# Patient Record
Sex: Female | Born: 1945 | Race: White | Hispanic: No | Marital: Married | Smoking: Never smoker
Health system: Southern US, Community
[De-identification: ages and names within clinical notes are randomized; demographics above are authoritative.]

## PROBLEM LIST (undated history)

## (undated) DIAGNOSIS — M8580 Other specified disorders of bone density and structure, unspecified site: Secondary | ICD-10-CM

## (undated) DIAGNOSIS — E119 Type 2 diabetes mellitus without complications: Secondary | ICD-10-CM

## (undated) DIAGNOSIS — I10 Essential (primary) hypertension: Secondary | ICD-10-CM

## (undated) DIAGNOSIS — I4891 Unspecified atrial fibrillation: Secondary | ICD-10-CM

## (undated) DIAGNOSIS — E1022 Type 1 diabetes mellitus with diabetic chronic kidney disease: Secondary | ICD-10-CM

## (undated) DIAGNOSIS — M5136 Other intervertebral disc degeneration, lumbar region: Secondary | ICD-10-CM

## (undated) DIAGNOSIS — K922 Gastrointestinal hemorrhage, unspecified: Secondary | ICD-10-CM

## (undated) DIAGNOSIS — N183 Chronic kidney disease, stage 3 unspecified: Secondary | ICD-10-CM

## (undated) DIAGNOSIS — M170 Bilateral primary osteoarthritis of knee: Secondary | ICD-10-CM

## (undated) DIAGNOSIS — M51369 Other intervertebral disc degeneration, lumbar region without mention of lumbar back pain or lower extremity pain: Secondary | ICD-10-CM

## (undated) DIAGNOSIS — I5032 Chronic diastolic (congestive) heart failure: Secondary | ICD-10-CM

## (undated) DIAGNOSIS — I4821 Permanent atrial fibrillation: Secondary | ICD-10-CM

## (undated) DIAGNOSIS — J90 Pleural effusion, not elsewhere classified: Secondary | ICD-10-CM

## (undated) DIAGNOSIS — I890 Lymphedema, not elsewhere classified: Secondary | ICD-10-CM

## (undated) DIAGNOSIS — I509 Heart failure, unspecified: Secondary | ICD-10-CM

## (undated) DIAGNOSIS — E785 Hyperlipidemia, unspecified: Secondary | ICD-10-CM

## (undated) DIAGNOSIS — I3139 Other pericardial effusion (noninflammatory): Secondary | ICD-10-CM

## (undated) HISTORY — PX: TONSILLECTOMY: SUR1361

## (undated) HISTORY — DX: Bilateral primary osteoarthritis of knee: M17.0

## (undated) HISTORY — DX: Pleural effusion, not elsewhere classified: J90

## (undated) HISTORY — DX: Heart failure, unspecified: I50.9

## (undated) HISTORY — DX: Type 2 diabetes mellitus without complications: E11.9

## (undated) HISTORY — DX: Gastrointestinal hemorrhage, unspecified: K92.2

## (undated) HISTORY — PX: CHOLECYSTECTOMY: SHX55

## (undated) HISTORY — DX: Permanent atrial fibrillation: I48.21

## (undated) HISTORY — DX: Other intervertebral disc degeneration, lumbar region without mention of lumbar back pain or lower extremity pain: M51.369

## (undated) HISTORY — PX: DILATION AND CURETTAGE OF UTERUS: SHX78

## (undated) HISTORY — PX: TEAR DUCT PROBING WITH STRABISMUS REPAIR: SHX5677

## (undated) HISTORY — DX: Hyperlipidemia, unspecified: E78.5

## (undated) HISTORY — DX: Other intervertebral disc degeneration, lumbar region: M51.36

## (undated) HISTORY — DX: Other pericardial effusion (noninflammatory): I31.39

## (undated) HISTORY — DX: Other specified disorders of bone density and structure, unspecified site: M85.80

## (undated) HISTORY — DX: Essential (primary) hypertension: I10

## (undated) HISTORY — DX: Chronic kidney disease, stage 3 (moderate): N18.3

## (undated) HISTORY — DX: Type 1 diabetes mellitus with diabetic chronic kidney disease: E10.22

## (undated) HISTORY — DX: Chronic kidney disease, stage 3 unspecified: N18.30

## (undated) SURGERY — Surgical Case
Anesthesia: *Unknown

## (undated) SURGERY — RIGHT HEART CATH
Anesthesia: Choice

---

## 2009-05-27 ENCOUNTER — Emergency Department: Payer: Self-pay | Admitting: Emergency Medicine

## 2012-02-13 ENCOUNTER — Ambulatory Visit: Payer: Self-pay

## 2013-02-24 ENCOUNTER — Ambulatory Visit: Payer: Self-pay

## 2013-02-24 LAB — HM DEXA SCAN: HM DEXA SCAN: NORMAL

## 2013-02-24 LAB — HM MAMMOGRAPHY

## 2014-08-02 ENCOUNTER — Ambulatory Visit: Payer: Self-pay | Admitting: Orthopedic Surgery

## 2015-01-27 DIAGNOSIS — M5136 Other intervertebral disc degeneration, lumbar region: Secondary | ICD-10-CM | POA: Insufficient documentation

## 2015-01-27 DIAGNOSIS — M170 Bilateral primary osteoarthritis of knee: Secondary | ICD-10-CM | POA: Insufficient documentation

## 2015-01-27 DIAGNOSIS — I129 Hypertensive chronic kidney disease with stage 1 through stage 4 chronic kidney disease, or unspecified chronic kidney disease: Secondary | ICD-10-CM | POA: Insufficient documentation

## 2015-01-27 DIAGNOSIS — N183 Chronic kidney disease, stage 3 unspecified: Secondary | ICD-10-CM | POA: Insufficient documentation

## 2015-01-27 DIAGNOSIS — E1022 Type 1 diabetes mellitus with diabetic chronic kidney disease: Secondary | ICD-10-CM | POA: Insufficient documentation

## 2015-01-27 DIAGNOSIS — M8580 Other specified disorders of bone density and structure, unspecified site: Secondary | ICD-10-CM | POA: Insufficient documentation

## 2015-01-27 DIAGNOSIS — E1129 Type 2 diabetes mellitus with other diabetic kidney complication: Secondary | ICD-10-CM | POA: Insufficient documentation

## 2015-01-27 DIAGNOSIS — M5137 Other intervertebral disc degeneration, lumbosacral region: Secondary | ICD-10-CM | POA: Insufficient documentation

## 2015-01-27 DIAGNOSIS — E785 Hyperlipidemia, unspecified: Secondary | ICD-10-CM | POA: Insufficient documentation

## 2015-01-27 DIAGNOSIS — H4010X1 Unspecified open-angle glaucoma, mild stage: Secondary | ICD-10-CM | POA: Insufficient documentation

## 2015-03-24 ENCOUNTER — Other Ambulatory Visit: Payer: Self-pay | Admitting: Family Medicine

## 2015-03-24 DIAGNOSIS — E119 Type 2 diabetes mellitus without complications: Secondary | ICD-10-CM

## 2015-03-24 DIAGNOSIS — Z780 Asymptomatic menopausal state: Secondary | ICD-10-CM

## 2015-03-24 DIAGNOSIS — E785 Hyperlipidemia, unspecified: Secondary | ICD-10-CM

## 2015-03-24 DIAGNOSIS — M8580 Other specified disorders of bone density and structure, unspecified site: Secondary | ICD-10-CM

## 2015-03-24 DIAGNOSIS — Z1239 Encounter for other screening for malignant neoplasm of breast: Secondary | ICD-10-CM

## 2015-03-24 DIAGNOSIS — I10 Essential (primary) hypertension: Secondary | ICD-10-CM

## 2015-03-24 LAB — HM DIABETES EYE EXAM

## 2015-03-27 ENCOUNTER — Other Ambulatory Visit: Payer: Self-pay

## 2015-03-27 ENCOUNTER — Encounter: Payer: Self-pay | Admitting: Family Medicine

## 2015-03-27 ENCOUNTER — Ambulatory Visit (INDEPENDENT_AMBULATORY_CARE_PROVIDER_SITE_OTHER): Payer: Medicare Other | Admitting: Family Medicine

## 2015-03-27 ENCOUNTER — Encounter (INDEPENDENT_AMBULATORY_CARE_PROVIDER_SITE_OTHER): Payer: Self-pay

## 2015-03-27 VITALS — BP 108/76 | HR 105 | Temp 97.7°F | Ht 61.0 in | Wt 236.0 lb

## 2015-03-27 DIAGNOSIS — E785 Hyperlipidemia, unspecified: Secondary | ICD-10-CM | POA: Diagnosis not present

## 2015-03-27 DIAGNOSIS — E119 Type 2 diabetes mellitus without complications: Secondary | ICD-10-CM

## 2015-03-27 DIAGNOSIS — I10 Essential (primary) hypertension: Secondary | ICD-10-CM | POA: Diagnosis not present

## 2015-03-27 DIAGNOSIS — I890 Lymphedema, not elsewhere classified: Secondary | ICD-10-CM | POA: Insufficient documentation

## 2015-03-27 DIAGNOSIS — E1022 Type 1 diabetes mellitus with diabetic chronic kidney disease: Secondary | ICD-10-CM

## 2015-03-27 DIAGNOSIS — N183 Chronic kidney disease, stage 3 unspecified: Secondary | ICD-10-CM

## 2015-03-27 DIAGNOSIS — M5136 Other intervertebral disc degeneration, lumbar region: Secondary | ICD-10-CM | POA: Diagnosis not present

## 2015-03-27 DIAGNOSIS — R609 Edema, unspecified: Secondary | ICD-10-CM

## 2015-03-27 LAB — LIPID PANEL PICCOLO, WAIVED
CHOL/HDL RATIO PICCOLO,WAIVE: 4 mg/dL
CHOLESTEROL PICCOLO, WAIVED: 234 mg/dL — AB (ref <200)
HDL Chol Piccolo, Waived: 59 mg/dL (ref >59)
LDL CHOL CALC PICCOLO WAIVED: 143 mg/dL — AB (ref <100)
TRIGLYCERIDES PICCOLO,WAIVED: 159 mg/dL — AB (ref <150)
VLDL Chol Calc Piccolo,Waive: 32 mg/dL — ABNORMAL HIGH (ref <30)

## 2015-03-27 LAB — HEPATIC FUNCTION PANEL
ALT: 18 U/L (ref 7–35)
AST: 18 U/L (ref 13–35)

## 2015-03-27 LAB — LIPID PANEL
Cholesterol: 234 mg/dL — AB (ref 0–200)
HDL: 59 mg/dL (ref 35–70)
LDL Cholesterol: 143 mg/dL
Triglycerides: 159 mg/dL (ref 40–160)

## 2015-03-27 LAB — HEMOGLOBIN A1C: Hgb A1c MFr Bld: 13.1 % — AB (ref 4.0–6.0)

## 2015-03-27 LAB — BAYER DCA HB A1C WAIVED: HB A1C: 13.1 % — AB (ref <7.0)

## 2015-03-27 MED ORDER — BENAZEPRIL-HYDROCHLOROTHIAZIDE 20-25 MG PO TABS: 1.0000 | ORAL_TABLET | Freq: Every day | ORAL | Status: DC

## 2015-03-27 MED ORDER — METFORMIN HCL ER (OSM) 1000 MG PO TB24: 1000.0000 mg | ORAL_TABLET | Freq: Two times a day (BID) | ORAL | Status: DC

## 2015-03-27 NOTE — Patient Instructions (Signed)
Chronic Kidney Disease Chronic kidney disease occurs when the kidneys are damaged over a long period. The kidneys are two organs that lie on either side of the spine between the middle of the back and the front of the abdomen. The kidneys:   Remove wastes and extra water from the blood.   Produce important hormones. These help keep bones strong, regulate blood pressure, and help create red blood cells.   Balance the fluids and chemicals in the blood and tissues. A small amount of kidney damage may not cause problems, but a large amount of damage may make it difficult or impossible for the kidneys to work the way they should. If steps are not taken to slow down the kidney damage or stop it from getting worse, the kidneys may stop working permanently. Most of the time, chronic kidney disease does not go away. However, it can often be controlled, and those with the disease can usually live normal lives. CAUSES  The most common causes of chronic kidney disease are diabetes and high blood pressure (hypertension). Chronic kidney disease may also be caused by:   Diseases that cause the kidneys' filters to become inflamed.   Diseases that affect the immune system.   Genetic diseases.   Medicines that damage the kidneys, such as anti-inflammatory medicines.  Poisoning or exposure to toxic substances.   A reoccurring kidney or urinary infection.   A problem with urine flow. This may be caused by:   Cancer.   Kidney stones.   An enlarged prostate in males. SIGNS AND SYMPTOMS  Because the kidney damage in chronic kidney disease occurs slowly, symptoms develop slowly and may not be obvious until the kidney damage becomes severe. A person may have a kidney disease for years without showing any symptoms. Symptoms can include:   Swelling (edema) of the legs, ankles, or feet.   Tiredness (lethargy).   Nausea or vomiting.   Confusion.   Problems with urination, such as:    Decreased urine production.   Frequent urination, especially at night.   Frequent accidents in children who are potty trained.   Muscle twitches and cramps.   Shortness of breath.  Weakness.   Persistent itchiness.   Loss of appetite.  Metallic taste in the mouth.  Trouble sleeping.  Slowed development in children.  Short stature in children. DIAGNOSIS  Chronic kidney disease may be detected and diagnosed by tests, including blood, urine, imaging, or kidney biopsy tests.  TREATMENT  Most chronic kidney diseases cannot be cured. Treatment usually involves relieving symptoms and preventing or slowing the progression of the disease. Treatment may include:   A special diet. You may need to avoid alcohol and foods thatare salty and high in potassium.   Medicines. These may:   Lower blood pressure.   Relieve anemia.   Relieve swelling.   Protect the bones. HOME CARE INSTRUCTIONS   Follow your prescribed diet.   Take medicines only as directed by your health care provider. Do not take any new medicines (prescription, over-the-counter, or nutritional supplements) unless approved by your health care provider. Many medicines can worsen your kidney damage or need to have the dose adjusted.   Quit smoking if you smoke. Talk to your health care provider about a smoking cessation program.   Keep all follow-up visits as directed by your health care provider. SEEK IMMEDIATE MEDICAL CARE IF:  Your symptoms get worse or you develop new symptoms.   You develop symptoms of end-stage kidney disease. These  include:   Headaches.   Abnormally dark or light skin.   Numbness in the hands or feet.   Easy bruising.   Frequent hiccups.   Menstruation stops.   You have a fever.   You have decreased urine production.   You havepain or bleeding when urinating. MAKE SURE YOU:  Understand these instructions.  Will watch your condition.  Will  get help right away if you are not doing well or get worse. FOR MORE INFORMATION   American Association of Kidney Patients: BombTimer.gl  National Kidney Foundation: www.kidney.[place]: https://mathis.com/  [facility]: www.lifeoptions.org and www.kidneyschool.org Document Released: 07/16/2008 Document Revised: 02/21/2014 Document Reviewed: 06/05/2012 [facility] Patient Information 2015 [place], [place]. This information is not intended to replace advice given to you by your health care provider. Make sure you discuss any questions you have with your health care provider. Type 2 Diabetes Mellitus Type 2 diabetes mellitus is a long-term (chronic) disease. In type 2 diabetes:  The pancreas does not make enough of a hormone called insulin.  The cells in the body do not respond as well to the insulin that is made.  Both of the above can happen. Normally, insulin moves sugars from food into tissue cells. This gives you energy. If you have type 2 diabetes, sugars cannot be moved into tissue cells. This causes high blood sugar (hyperglycemia).  HOME CARE  Have your hemoglobin A1c level checked twice a year. The level shows if your diabetes is under control or out of control.  Test your blood sugar level every day as told by your doctor.  Check your ketone levels by testing your pee (urine) when you are sick and as told.  Take your diabetes or insulin medicine as told by your doctor.  Never run out of insulin.  Adjust how much insulin you give yourself based on how many carbs (carbohydrates) you eat. Carbs are in many foods, such as fruits, vegetables, whole grains, and dairy products.  Have a healthy snack between every healthy meal. Have 3 meals and 3 snacks a day.  Lose weight if you are overweight.  Carry a medical alert card or wear your medical alert jewelry.  Carry a 15-gram carb snack with you at all times. Examples include:  Glucose pills,  3 or 4.  Glucose gel, 15-gram tube.  Raisins, 2 tablespoons (24 grams).  Jelly beans, 6.  Animal crackers, 8.  Regular (not diet) pop, 4 ounces (120 milliliters).  Gummy treats, 9.  Notice low blood sugar (hypoglycemia) symptoms, such as:  Shaking (tremors).  Trouble thinking clearly.  Sweating.  Faster heart rate.  Headache.  Dry mouth.  Hunger.  Crabbiness (irritability).  Being worried or tense (anxious).  Restless sleep.  A change in speech or coordination.  Confusion.  Treat low blood sugar right away. If you are alert and can swallow, follow the 15:15 rule:  Take 15-20 grams of a rapid-acting glucose or carb. This includes glucose gel, glucose pills, or 4 ounces (120 milliliters) of fruit juice, regular pop, or low-fat milk.  Check your blood sugar level 15 minutes after taking the glucose.  Take 15-20 grams more of glucose if the repeat blood sugar level is still 70 mg/dL (milligrams/deciliter) or below.  Eat a meal or snack within 1 hour of the blood sugar levels going back to normal.  Notice early symptoms of high blood sugar, such as:  Being really thirsty or drinking a lot (polydipsia).  Peeing a lot (  polyuria).  Do at least 150 minutes of physical activity a week or as told.  Split the 150 minutes of activity up during the week. Do not do 150 minutes of activity in one day.  Perform exercises, such as weight lifting, at least 2 times a week or as told.  Spend no more than 90 minutes at one time inactive.  Adjust your insulin or food intake as needed if you start a new exercise or sport.  Follow your sick-day plan when you are not able to eat or drink as usual.  Do not smoke, chew tobacco, or use electronic cigarettes.  Women who are not pregnant should drink no more than 1 drink a day. Men should drink no more than 2 drinks a day.  Only drink alcohol with food.  Ask your doctor if alcohol is safe for you.  Tell your doctor if you  drink alcohol several times during the week.  See your doctor regularly.  Schedule an eye exam soon after you are told you have diabetes. Schedule exams once every year.  Check your skin and feet every day. Check for cuts, bruises, redness, nail problems, bleeding, blisters, or sores. A doctor should do a foot exam once a year.  Brush your teeth and gums twice a day. Floss once a day. Visit your dentist regularly.  Share your diabetes plan with your workplace or school.  Stay up-to-date with shots that fight against diseases (immunizations).  Learn how to deal with stress.  Get diabetes education and support as needed.  Ask your doctor for special help if:  You need help to maintain or improve how you do things on your own.  You need help to maintain or improve the quality of your life.  You have foot or hand problems.  You have trouble cleaning yourself, dressing, eating, or doing physical activity. GET HELP IF:  You are unable to eat or drink for more than 6 hours.  You feel sick to your stomach (nauseous) or throw up (vomit) for more than 6 hours.  Your blood sugar level is over 240 mg/dL.  There is a change in mental status.  You get another serious illness.  You have watery poop (diarrhea) for more than 6 hours.  You have been sick or have had a fever for 2 or more days and are not getting better.  You have pain when you are active. GET HELP RIGHT AWAY IF:  You have trouble breathing.  Your ketone levels are higher than your doctor says they should be. MAKE SURE YOU:  Understand these instructions.  Will watch your condition.  Will get help right away if you are not doing well or get worse. Document Released: 07/16/2008 Document Revised: 02/21/2014 Document Reviewed: 05/08/2012 [facility] Patient Information 2015 [place], [place]. This information is not intended to replace advice given to you by your health care provider. Make sure you discuss any questions  you have with your health care provider.

## 2015-03-27 NOTE — Assessment & Plan Note (Signed)
Pain in her back has gotten worse and is preventing her from doing activities that she would like to do. Would like a 2nd opinion from a neurosurgeon of her choice. Referral generated today. Does not want referral to pain management at this time- would like to hear what the neurosurgeon has to say. Would greatly benefit from weight loss and getting back in the pool.

## 2015-03-27 NOTE — Assessment & Plan Note (Signed)
Likely multifactorial. No suggestion of DVT. Suggested wearing her compression stockings starting in the AM, all day except at night. Elevate when able to. Await results of CMP. Recheck in 1 month at follow up. Continue to monitor.

## 2015-03-27 NOTE — Assessment & Plan Note (Signed)
Discussed CKD with patient today. She was not aware she had it. Discussed that we need to get blood sugar under control and keep BP under control. On ACE-inhibitor for renal protection. CMP checked again today. If GFR worsening again, may need to consider referral to nephrology. Continue to monitor. Continue current regimen.

## 2015-03-27 NOTE — Assessment & Plan Note (Signed)
Patient wanted to stay off medication again. A1c significantly elevated at 13.1. Discussed with patient that this is a level where we would normally be talking about starting insulin. She doesn't want to do that. Discussed that we cannot let her just try to bring this down on her own. Will start metformin 1000mg  BID. Will likely need additional medication to bring it down. Stressed the importance of diet and exercise. Will have her really work hard and continue to monitor. Follow up in 1 month to check on tolerance and to see how her sugars have been doing. Continue to monitor closely.

## 2015-03-27 NOTE — Progress Notes (Signed)
BP 108/76 mmHg  Pulse 105  Temp(Src) 97.7 F (36.5 C)  Ht 5' 1" (1.549 m)  Wt 236 lb (107.049 kg)  BMI 44.61 kg/m2  SpO2 94%   Subjective:    Patient ID: Karen Farley, female    DOB: 1945/11/26, 69 y.o.   MRN: [id number]  HPI: Karen Farley is a 69 y.o. female presenting on 03/27/2015 for Diabetes; Hypertension; and Hyperlipidemia Has been caring for her mother-in-law and she recently passed away. With that, her routine has changed significantly and she hasn't been doing what she is supposed to be doing. She has started working on it again and has been doing better with it, but knows that it might be very high today.   HYPERTENSION / HYPERLIPIDEMIA Satisfied with current treatment? yes Duration of hypertension: chronic BP monitoring frequency: not checking BP range:  BP medication side effects: no Past BP meds: benazepril/HCTZ, tekturna, valsartan, valsartan-HCTZ and verapamil Duration of hyperlipidemia: chronic Cholesterol medication side effects: no- not on anything Cholesterol supplements: none Past cholesterol medications: none Medication compliance: excellent compliance Aspirin: no Recent stressors: yes Recurrent headaches: no Visual changes: no Palpitations: no Dyspnea: no Chest pain: no Lower extremity edema: yes Dizzy/lightheaded: no  DIABETES- states that she knows that her A1c is going to be high today. She states that she has had a lot going on and her sugars have not been doing as well as they should have been.  Hypoglycemic episodes:no Polydipsia/polyuria: no Visual disturbance: no Chest pain: no Paresthesias: no Glucose Monitoring: yes  Accucheck frequency: Daily  Fasting glucose: in the 200s Taking Insulin?: no Blood Pressure Monitoring: not checking Retinal Examination: Up to Date- went Last Friday Foot Exam: Up to Date- done today Diabetic Education: Not Completed Pneumovax: Up to Date  Refuses prevnar Influenza: Not up to Date Refuses  flu vaccine Aspirin: no   BACK PAIN- has DDD, had a steroid injection that didn't help at all. Would like a referral to see a different neurosurgeon for a 2nd opinion regarding back surgery.  Duration: chronic Mechanism of injury: unknown Location: low back Onset: gradual Severity: severe Quality: dull, aching, shooting and throbbing Frequency: constant Radiation: none Aggravating factors: lifting, movement, walking, bending and prolonged sitting Alleviating factors: rest, heat and laying Status: worse Treatments attempted: rest  Relief with NSAIDs?: mild Nighttime pain:  no Paresthesias / decreased sensation:  yes Bowel / bladder incontinence:  no Fevers:  no Dysuria / urinary frequency:  no  Edema: has noticed significant edema over the past few days. Doesn't usually have it unless she's flying. Has not been wearing compression stockings. Has been elevating, but not that much. Better in the AM, worse in the PM.  Relevant past medical, surgical, family and social history reviewed and updated as indicated. Interim medical history since our last visit reviewed. Allergies and medications reviewed and updated.  Current Outpatient Prescriptions on File Prior to Visit  Medication Sig  . Azelaic Acid 15 % cream Apply topically 2 (two) times daily. After skin is thoroughly washed and patted dry, gently but thoroughly massage a thin film of azelaic acid cream into the affected area twice daily, in the morning and evening.  . Clobetasol Prop Emollient Base 0.05 % emollient cream Apply topically 2 (two) times daily.  Regino Schultze Bandages & Supports (MEDICAL COMPRESSION STOCKINGS) MISC by Does not apply route.  Marland Kitchen glucose blood test strip 1 each by Other route as needed for other. Use as instructed  . latanoprost (XALATAN) 0.005 %  ophthalmic solution 1 drop at bedtime.  . Multiple Vitamin (MULTIVITAMIN WITH MINERALS) TABS tablet Take 1 tablet by mouth daily.   No current facility-administered  medications on file prior to visit.    Review of Systems  Constitutional: Negative.   Respiratory: Negative.   Cardiovascular: Positive for leg swelling. Negative for chest pain and palpitations.  Gastrointestinal: Negative.   Musculoskeletal: Positive for back pain.       Feels like it is getting worse. Can't do anything that she wants to. Difficult getting around.  Neurological: Negative.   Psychiatric/Behavioral: Negative.     Per HPI unless specifically indicated above     Objective:    BP 108/76 mmHg  Pulse 105  Temp(Src) 97.7 F (36.5 C)  Ht 5' 1" (1.549 m)  Wt 236 lb (107.049 kg)  BMI 44.61 kg/m2  SpO2 94%  Wt Readings from Last 3 Encounters:  03/27/15 236 lb (107.049 kg)  09/24/14 244 lb (110.678 kg)    Physical Exam  Constitutional: She is oriented to person, place, and time. She appears well-developed and well-nourished.  Morbidly obese.   HENT:  Head: Normocephalic and atraumatic.  Eyes: Conjunctivae and EOM are normal. Pupils are equal, round, and reactive to light.  Cardiovascular: Normal rate and regular rhythm.   Pulmonary/Chest: Effort normal and breath sounds normal.  Musculoskeletal: She exhibits edema.  2+ edema bilaterally with hemosiderin changes bilaterally  Neurological: She is alert and oriented to person, place, and time. She has normal reflexes.  Skin: Skin is warm and dry.  Psychiatric: She has a normal mood and affect. Her behavior is normal. Judgment and thought content normal.  Nursing note and vitals reviewed. DM Foot Exam Color: normal Sensation Monofilament:normal right and left dorsal, plantar, toe and distal Circulation: Pulses normal right and left pedal and posterial tibial  Lesions: none    Results for orders placed or performed in visit on 03/27/15  Hemoglobin A1c  Result Value Ref Range   Hgb A1c MFr Bld 13.1 (A) 4.0 - 6.0 %  Lipid panel  Result Value Ref Range   Cholesterol 234 (A) 0 - 200 mg/dL  Lipid panel  Result  Value Ref Range   Triglycerides 159 40 - 160 mg/dL   HDL 59 35 - 70 mg/dL   LDL Cholesterol 143 mg/dL  Hepatic function panel  Result Value Ref Range   ALT 18 7 - 35 U/L   AST 18 13 - 35 U/L      Assessment & Plan:   Problem List Items Addressed This Visit    Diabetes mellitus without complication - Primary    Patient wanted to stay off medication again. A1c significantly elevated at 13.1. Discussed with patient that this is a level where we would normally be talking about starting insulin. She doesn't want to do that. Discussed that we cannot let her just try to bring this down on her own. Will start metformin 1035m BID. Will likely need additional medication to bring it down. Stressed the importance of diet and exercise. Will have her really work hard and continue to monitor. Follow up in 1 month to check on tolerance and to see how her sugars have been doing. Continue to monitor closely.       Relevant Medications   metformin (FORTAMET) 1000 MG (OSM) 24 hr tablet   benazepril-hydrochlorthiazide (LOTENSIN HCT) 20-25 MG per tablet   Other Relevant Orders   HgB A1c   Hypertension    Well controlled on current regimen.  Will recheck CMP today. Continue benazepril-HCTZ. Stressed the importance of diet and exercise. Discussed chronic kidney disease. Continue to monitor.       Relevant Medications   benazepril-hydrochlorthiazide (LOTENSIN HCT) 20-25 MG per tablet   Hyperlipidemia    Not under good control at this time. Patient would really like to try diet and exercise prior to starting another medication. Will give her 3-6 months. If not doing better on recheck, will need high-dose statin. Continue to monitor closely.       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 She was not aware she had it. Discussed that we need to get blood sugar under control and keep  BP under control. On ACE-inhibitor for renal protection. CMP checked again today. If GFR worsening again, may need to consider referral to nephrology. Continue to monitor. Continue current regimen.       Relevant Medications   metformin (FORTAMET) 1000 MG (OSM) 24 hr tablet   benazepril-hydrochlorthiazide (LOTENSIN HCT) 20-25 MG per tablet   DDD (degenerative disc disease), lumbar    Pain in her back has gotten worse and is preventing her from doing activities that she would like to do. Would like a 2nd opinion from a neurosurgeon of her choice. Referral generated today. Does not want referral to pain management at this time- would like to hear what the neurosurgeon has to say. Would greatly benefit from weight loss and getting back in the pool.       Relevant Orders   Ambulatory referral to Neurosurgery   Edema    Likely multifactorial. No suggestion of DVT. Suggested wearing her compression stockings starting in the AM, all day except at night. Elevate when able to. Await results of CMP. Recheck in 1 month at follow up. Continue to monitor.        Other Visit Diagnoses    Hyperlipemia        Relevant Medications    benazepril-hydrochlorthiazide (LOTENSIN HCT) 20-25 MG per tablet    Other Relevant Orders    Cholesterol, Total    Essential hypertension, benign        Relevant Medications    benazepril-hydrochlorthiazide (LOTENSIN HCT) 20-25 MG per tablet    Other Relevant Orders    Comp Met (CMET)        Follow up plan: Return in about 4 weeks (around 04/24/2015) for follow up on DM.         

## 2015-03-27 NOTE — Assessment & Plan Note (Signed)
Not under good control at this time. Patient would really like to try diet and exercise prior to starting another medication. Will give her 3-6 months. If not doing better on recheck, will need high-dose statin. Continue to monitor closely.

## 2015-03-27 NOTE — Assessment & Plan Note (Signed)
Well controlled on current regimen. Will recheck CMP today. Continue benazepril-HCTZ. Stressed the importance of diet and exercise. Discussed chronic kidney disease. Continue to monitor.

## 2015-03-28 ENCOUNTER — Other Ambulatory Visit: Payer: Self-pay | Admitting: Family Medicine

## 2015-03-28 DIAGNOSIS — N183 Chronic kidney disease, stage 3 (moderate): Secondary | ICD-10-CM

## 2015-03-28 LAB — COMPREHENSIVE METABOLIC PANEL: CALCIUM: 9.8 mg/dL (ref 8.7–10.3)

## 2015-03-29 ENCOUNTER — Telehealth: Payer: Self-pay | Admitting: Family Medicine

## 2015-03-29 NOTE — Telephone Encounter (Signed)
Called and let patient know the results of her lab work. Cholesterol up- will work on diet and exercise and recheck in 3 months. Kidney function down. Recommended referral to nephrology patient would like to hold off right this second and see how she does next visit. Advised her that she doesn't need to make an appointment yet and we can see how she does in a month. Patient aware of results.

## 2015-04-06 ENCOUNTER — Telehealth: Payer: Self-pay | Admitting: Family Medicine

## 2015-04-06 MED ORDER — METFORMIN HCL ER (OSM) 1000 MG PO TB24: 1000.0000 mg | ORAL_TABLET | Freq: Two times a day (BID) | ORAL | Status: DC

## 2015-04-06 MED ORDER — BENAZEPRIL-HYDROCHLOROTHIAZIDE 20-25 MG PO TABS: 1.0000 | ORAL_TABLET | Freq: Every day | ORAL | Status: DC

## 2015-04-06 NOTE — Telephone Encounter (Signed)
E-Fax came through for refill: [id number] (LOTENSIN HCT) 20-25 MG per tablet Copy of Rx in basket beside me.

## 2015-04-06 NOTE — Telephone Encounter (Signed)
Pharm would like to get rx for Benazepril/HCTZ and Metformin changed to 90 day supplies.

## 2015-04-06 NOTE — Telephone Encounter (Signed)
Rx sent to her pharmacy 

## 2015-04-27 ENCOUNTER — Other Ambulatory Visit: Payer: Self-pay | Admitting: Family Medicine

## 2015-04-27 DIAGNOSIS — E1122 Type 2 diabetes mellitus with diabetic chronic kidney disease: Secondary | ICD-10-CM

## 2015-04-27 DIAGNOSIS — N182 Chronic kidney disease, stage 2 (mild): Secondary | ICD-10-CM

## 2015-04-28 ENCOUNTER — Encounter: Payer: Self-pay | Admitting: Family Medicine

## 2015-04-28 ENCOUNTER — Ambulatory Visit (INDEPENDENT_AMBULATORY_CARE_PROVIDER_SITE_OTHER): Payer: Medicare Other | Admitting: Family Medicine

## 2015-04-28 DIAGNOSIS — E1122 Type 2 diabetes mellitus with diabetic chronic kidney disease: Secondary | ICD-10-CM

## 2015-04-28 DIAGNOSIS — N183 Chronic kidney disease, stage 3 unspecified: Secondary | ICD-10-CM | POA: Insufficient documentation

## 2015-04-28 DIAGNOSIS — N182 Chronic kidney disease, stage 2 (mild): Secondary | ICD-10-CM

## 2015-04-28 MED ORDER — BENAZEPRIL-HYDROCHLOROTHIAZIDE 20-25 MG PO TABS: 1.0000 | ORAL_TABLET | Freq: Every day | ORAL | Status: DC

## 2015-04-28 NOTE — Assessment & Plan Note (Signed)
Tolerating her metformin. Sugars doing better. A1c due in 2 months. Due to see kidney doctor- needs to make appointment. Not exercising. Recommended punching during commercials and going back to the [facility]. Recommended compression stockings for swelling and elevation. Not interested in referral to GI at this time. Will continue to monitor and let us know if swallowing gets worse.

## 2015-04-28 NOTE — Patient Instructions (Signed)
Edema  Edema is an abnormal buildup of fluids. It is more common in your legs and thighs. Painless swelling of the feet and ankles is more likely as a person ages. It also is common in looser skin, like around your eyes.  HOME CARE   · Keep the affected body part above the level of the heart while lying down.  · Do not sit still or stand for a long time.  · Do not put anything right under your knees when you lie down.  · Do not wear tight clothes on your upper legs.  · Exercise your legs to help the puffiness (swelling) go down.  · Wear elastic bandages or support stockings as told by your doctor.  · A low-salt diet may help lessen the puffiness.  · Only take medicine as told by your doctor.  GET HELP IF:  · Treatment is not working.  · You have heart, liver, or kidney disease and notice that your skin looks puffy or shiny.  · You have puffiness in your legs that does not get better when you raise your legs.  · You have sudden weight gain for no reason.  GET HELP RIGHT AWAY IF:   · You have shortness of breath or chest pain.  · You cannot breathe when you lie down.  · You have pain, redness, or warmth in the areas that are puffy.  · You have heart, liver, or kidney disease and get edema all of a sudden.  · You have a fever and your symptoms get worse all of a sudden.  MAKE SURE YOU:   · Understand these instructions.  · Will watch your condition.  · Will get help right away if you are not doing well or get worse.  Document Released: 03/25/2008 Document Revised: 10/12/2013 Document Reviewed: 07/30/2013  [employer]® Patient Information ©2015 [employer]. This information is not intended to replace advice given to you by your health care provider. Make sure you discuss any questions you have with your health care provider.

## 2015-04-28 NOTE — Progress Notes (Signed)
BP 138/90 mmHg  Pulse 114  Temp(Src) 98.4 F (36.9 C)  Ht 5' 1.5" (1.562 m)  Wt 236 lb (107.049 kg)  BMI 43.88 kg/m2  SpO2 94%   Subjective:    Patient ID: Karen Farley, female    DOB: 1946/03/14, 69 y.o.   MRN: [id number]  HPI: Karen Farley is a 69 y.o. female  Chief Complaint  Patient presents with  . Diabetes   DIABETES- did OK on the medicine, then got an upset stomach, then got better, going back and forth with some diarrhea Hypoglycemic episodes:no Polydipsia/polyuria: no Visual disturbance: no Chest pain: no Paresthesias: no Glucose Monitoring: yes  Accucheck frequency: Daily  Fasting glucose: 152-133 Blood Pressure Monitoring: not checking Retinal Examination: Up to Date Foot Exam: Up to Date Diabetic Education: Completed  Painful swallowing. Has been going on for the past couple of months. Only with swallowing water, very occasionally, usually with big gulps. No other complaints.   Relevant past medical, surgical, family and social history reviewed and updated as indicated. Interim medical history since our last visit reviewed. Allergies and medications reviewed and updated.  Review of Systems  Constitutional: Negative.   Cardiovascular: Negative.   Musculoskeletal: Positive for back pain and gait problem. Negative for myalgias, joint swelling, arthralgias, neck pain and neck stiffness.  Neurological: Negative.   Psychiatric/Behavioral: Negative.     Per HPI unless specifically indicated above     Objective:    BP 138/90 mmHg  Pulse 114  Temp(Src) 98.4 F (36.9 C)  Ht 5' 1.5" (1.562 m)  Wt 236 lb (107.049 kg)  BMI 43.88 kg/m2  SpO2 94%  Wt Readings from Last 3 Encounters:  04/28/15 236 lb (107.049 kg)  03/27/15 236 lb (107.049 kg)  09/24/14 244 lb (110.678 kg)    Physical Exam  Constitutional: She is oriented to person, place, and time. She appears well-developed and well-nourished. No distress.  HENT:  Head: Normocephalic and  atraumatic.  Right Ear: Hearing normal.  Left Ear: Hearing normal.  Nose: Nose normal.  Eyes: Conjunctivae and lids are normal. Right eye exhibits no discharge. Left eye exhibits no discharge. No scleral icterus.  Cardiovascular: Normal rate, regular rhythm and normal heart sounds.  Exam reveals no gallop and no friction rub.   No murmur heard. Pulmonary/Chest: Effort normal. No respiratory distress. She has no wheezes. She has no rales. She exhibits no tenderness.  Musculoskeletal: Normal range of motion. She exhibits edema.  Neurological: She is alert and oriented to person, place, and time.  Skin: Skin is warm, dry and intact. No rash noted. No erythema. No pallor.  Psychiatric: She has a normal mood and affect. Her speech is normal and behavior is normal. Judgment and thought content normal. Cognition and memory are normal.    Results for orders placed or performed in visit on 03/27/15  Comp Met (CMET)  Result Value Ref Range   Calcium 9.8 8.7 - 10.3 mg/dL  Hemoglobin A1c  Result Value Ref Range   Hgb A1c MFr Bld 13.1 (A) 4.0 - 6.0 %  Lipid panel  Result Value Ref Range   Cholesterol 234 (A) 0 - 200 mg/dL  Lipid panel  Result Value Ref Range   Triglycerides 159 40 - 160 mg/dL   HDL 59 35 - 70 mg/dL   LDL Cholesterol 143 mg/dL  Hepatic function panel  Result Value Ref Range   ALT 18 7 - 35 U/L   AST 18 13 - 35 U/L  Lipid  Panel Piccolo, [employer]  Result Value Ref Range   Cholesterol Piccolo, Waived 234 (H) <200 mg/dL   HDL Chol Piccolo, Waived 59 >59 mg/dL   Triglycerides Piccolo,Waived 159 (H) <150 mg/dL   Chol/HDL Ratio Piccolo,Waive 4.0 mg/dL   LDL Chol Calc Piccolo Waived 143 (H) <100 mg/dL   VLDL Chol Calc Piccolo,Waive 32 (H) <30 mg/dL  Bayer DCA Hb A1c Waived  Result Value Ref Range   Bayer DCA Hb A1c Waived 13.1 (H) <7.0 %  HM DIABETES EYE EXAM  Result Value Ref Range   HM Diabetic Eye Exam No Retinopathy No Retinopathy      Assessment & Plan:   Problem List  Items Addressed This Visit      Genitourinary   CKD stage 2 due to type 2 diabetes mellitus    Tolerating her metformin. Sugars doing better. A1c due in 2 months. Due to see kidney doctor- needs to make appointment. Not exercising. Recommended punching during commercials and going back to the [facility]. Recommended compression stockings for swelling and elevation. Not interested in referral to GI at this time. Will continue to monitor and let us know if swallowing gets worse.       Relevant Medications   benazepril-hydrochlorthiazide (LOTENSIN HCT) 20-25 MG per tablet       Follow up plan: Return in about 2 months (around 06/29/2015).

## 2015-04-29 LAB — BASIC METABOLIC PANEL
BUN/Creatinine Ratio: 15 (ref 11–26)
BUN: 15 mg/dL (ref 8–27)
CHLORIDE: 98 mmol/L (ref 97–108)
CO2: 23 mmol/L (ref 18–29)
CREATININE: 0.99 mg/dL (ref 0.57–1.00)
Calcium: 9.8 mg/dL (ref 8.7–10.3)
GFR calc non Af Amer: 58 mL/min/{1.73_m2} — ABNORMAL LOW (ref >59)
GFR, EST AFRICAN AMERICAN: 67 mL/min/{1.73_m2} (ref >59)
Glucose: 148 mg/dL — ABNORMAL HIGH (ref 65–99)
Potassium: 4.4 mmol/L (ref 3.5–5.2)
SODIUM: 142 mmol/L (ref 134–144)

## 2015-06-23 ENCOUNTER — Ambulatory Visit
Admission: RE | Admit: 2015-06-23 | Discharge: 2015-06-23 | Disposition: A | Discharge status: Home / Self Care | Payer: Medicare Other | Source: Ambulatory Visit | Attending: Neurosurgery | Admitting: Neurosurgery

## 2015-06-23 ENCOUNTER — Other Ambulatory Visit: Payer: Self-pay | Admitting: Neurosurgery

## 2015-06-23 DIAGNOSIS — M479 Spondylosis, unspecified: Secondary | ICD-10-CM | POA: Diagnosis not present

## 2015-06-23 DIAGNOSIS — M5136 Other intervertebral disc degeneration, lumbar region: Secondary | ICD-10-CM | POA: Insufficient documentation

## 2015-06-23 DIAGNOSIS — M545 Low back pain: Secondary | ICD-10-CM | POA: Diagnosis present

## 2015-06-23 DIAGNOSIS — M1288 Other specific arthropathies, not elsewhere classified, other specified site: Secondary | ICD-10-CM | POA: Diagnosis not present

## 2015-06-23 DIAGNOSIS — G8929 Other chronic pain: Secondary | ICD-10-CM | POA: Insufficient documentation

## 2015-07-07 ENCOUNTER — Encounter: Payer: Self-pay | Admitting: Family Medicine

## 2015-07-07 ENCOUNTER — Encounter (INDEPENDENT_AMBULATORY_CARE_PROVIDER_SITE_OTHER): Payer: Self-pay

## 2015-07-07 ENCOUNTER — Other Ambulatory Visit: Payer: Self-pay | Admitting: Family Medicine

## 2015-07-07 ENCOUNTER — Ambulatory Visit (INDEPENDENT_AMBULATORY_CARE_PROVIDER_SITE_OTHER): Payer: Medicare Other | Admitting: Family Medicine

## 2015-07-07 DIAGNOSIS — N182 Chronic kidney disease, stage 2 (mild): Secondary | ICD-10-CM

## 2015-07-07 DIAGNOSIS — E119 Type 2 diabetes mellitus without complications: Secondary | ICD-10-CM

## 2015-07-07 DIAGNOSIS — E1122 Type 2 diabetes mellitus with diabetic chronic kidney disease: Secondary | ICD-10-CM

## 2015-07-07 DIAGNOSIS — I10 Essential (primary) hypertension: Secondary | ICD-10-CM

## 2015-07-07 LAB — BAYER DCA HB A1C WAIVED: HB A1C: 7.7 % — AB (ref <7.0)

## 2015-07-07 LAB — MICROALBUMIN, URINE WAIVED
Creatinine, Urine Waived: 300 mg/dL (ref 10–300)
MICROALB, UR WAIVED: 30 mg/L — AB (ref 0–19)
Microalb/Creat Ratio: <30 mg/g (ref <30)

## 2015-07-07 MED ORDER — METFORMIN HCL ER (OSM) 1000 MG PO TB24: 1000.0000 mg | ORAL_TABLET | Freq: Two times a day (BID) | ORAL | Status: DC

## 2015-07-07 NOTE — Progress Notes (Signed)
BP 120/76 mmHg  Pulse 115  Temp(Src) 98 F (36.7 C)  Wt 236 lb (107.049 kg)  SpO2 94%   Subjective:    Patient ID: Karen Farley, female    DOB: Apr 11, 1946, 69 y.o.   MRN: [id number]  HPI: Karen Farley is a 69 y.o. female  Chief Complaint  Patient presents with  . Diabetes   Hasn't been feeling that great. Saw a neurosurgeon for her back and the positions for the x-rays exacerbated her back pain. She is seeing them again at the end of October. He said that she could stay how she is right now or she could have surgery to put a steel bar in and clean up the disc area. She has not made up her mind yet. She is leaning towards having the surgery.    DIABETES- still having diarrhea with the metformin off and on.  Hypoglycemic episodes:no Polydipsia/polyuria: no Visual disturbance: no Chest pain: no Paresthesias: no Glucose Monitoring: yes  Accucheck frequency: Daily  Fasting glucose: 130-150 Taking Insulin?: no Blood Pressure Monitoring: not checking Retinal Examination: Up to Date Foot Exam: Up to Date Diabetic Education: Completed Pneumovax: Refused Influenza: Refused Aspirin: no  Relevant past medical, surgical, family and social history reviewed and updated as indicated. Interim medical history since our last visit reviewed. Allergies and medications reviewed and updated.  Review of Systems  Constitutional: Negative.   Respiratory: Negative.   Cardiovascular: Negative.   Gastrointestinal: Negative.   Psychiatric/Behavioral: Negative.    Per HPI unless specifically indicated above     Objective:    BP 120/76 mmHg  Pulse 115  Temp(Src) 98 F (36.7 C)  Wt 236 lb (107.049 kg)  SpO2 94%  Wt Readings from Last 3 Encounters:  07/07/15 236 lb (107.049 kg)  04/28/15 236 lb (107.049 kg)  03/27/15 236 lb (107.049 kg)    Physical Exam  Constitutional: She is oriented to person, place, and time. She appears well-developed and well-nourished. No distress.   HENT:  Head: Normocephalic and atraumatic.  Right Ear: Hearing normal.  Left Ear: Hearing normal.  Nose: Nose normal.  Eyes: Conjunctivae and lids are normal. Right eye exhibits no discharge. Left eye exhibits no discharge. No scleral icterus.  Cardiovascular: Normal rate, regular rhythm, normal heart sounds and intact distal pulses.  Exam reveals no gallop and no friction rub.   No murmur heard. Pulmonary/Chest: Effort normal and breath sounds normal. No respiratory distress. She has no wheezes. She has no rales. She exhibits no tenderness.  Musculoskeletal: Normal range of motion.  Neurological: She is alert and oriented to person, place, and time.  Skin: Skin is intact. No rash noted.  Psychiatric: She has a normal mood and affect. Her speech is normal and behavior is normal. Judgment and thought content normal. Cognition and memory are normal.  Nursing note and vitals reviewed.   Results for orders placed or performed in visit on [id number]  Basic metabolic panel  Result Value Ref Range   Glucose 148 (H) 65 - 99 mg/dL   BUN 15 8 - 27 mg/dL   Creatinine, Ser 0.99 0.57 - 1.00 mg/dL   GFR calc non Af Amer 58 (L) >59 mL/min/1.73   GFR calc Af Amer 67 >59 mL/min/1.73   BUN/Creatinine Ratio 15 11 - 26   Sodium 142 134 - 144 mmol/L   Potassium 4.4 3.5 - 5.2 mmol/L   Chloride 98 97 - 108 mmol/L   CO2 23 18 - 29 mmol/L   Calcium  9.8 8.7 - 10.3 mg/dL      Assessment & Plan:   Problem List Items Addressed This Visit      Cardiovascular and Mediastinum   Hypertension    Under good control. Continue current regimen. Continue to monitor. CMP and microalbumin checked today.         Endocrine   Diabetes mellitus without complication    Doing so much better. A1c down to 7.7 from 13.1! Keep up the good work. Stay on current regimen and recheck in 3 months.       Relevant Medications   metformin (FORTAMET) 1000 MG (OSM) 24 hr tablet     Genitourinary   CKD stage 2 due to type 2  diabetes mellitus    BP under good control. Checking CMP, A1c and microalbumin today.       Relevant Medications   metformin (FORTAMET) 1000 MG (OSM) 24 hr tablet       Follow up plan: Return in about 4 months (around 11/06/2015) for Physical.

## 2015-07-07 NOTE — Assessment & Plan Note (Signed)
BP under good control. Checking CMP, A1c and microalbumin today.

## 2015-07-07 NOTE — Assessment & Plan Note (Signed)
Under good control. Continue current regimen. Continue to monitor. CMP and microalbumin checked today.

## 2015-07-07 NOTE — Assessment & Plan Note (Signed)
Doing so much better. A1c down to 7.7 from 13.1! Keep up the good work. Stay on current regimen and recheck in 3 months.

## 2015-07-08 LAB — COMPREHENSIVE METABOLIC PANEL
A/G RATIO: 1.8 (ref 1.1–2.5)
ALBUMIN: 4.2 g/dL (ref 3.6–4.8)
ALK PHOS: 64 IU/L (ref 39–117)
ALT: 22 IU/L (ref 0–32)
AST: 12 IU/L (ref 0–40)
BUN / CREAT RATIO: 22 (ref 11–26)
BUN: 27 mg/dL (ref 8–27)
Bilirubin Total: 0.6 mg/dL (ref 0.0–1.2)
CALCIUM: 9.8 mg/dL (ref 8.7–10.3)
CO2: 21 mmol/L (ref 18–29)
CREATININE: 1.23 mg/dL — AB (ref 0.57–1.00)
Chloride: 98 mmol/L (ref 97–108)
GFR calc Af Amer: 52 mL/min/{1.73_m2} — ABNORMAL LOW (ref >59)
GFR, EST NON AFRICAN AMERICAN: 45 mL/min/{1.73_m2} — AB (ref >59)
GLOBULIN, TOTAL: 2.3 g/dL (ref 1.5–4.5)
Glucose: 170 mg/dL — ABNORMAL HIGH (ref 65–99)
POTASSIUM: 4.3 mmol/L (ref 3.5–5.2)
SODIUM: 142 mmol/L (ref 134–144)
Total Protein: 6.5 g/dL (ref 6.0–8.5)

## 2015-07-10 ENCOUNTER — Encounter: Payer: Self-pay | Admitting: Family Medicine

## 2015-08-08 ENCOUNTER — Telehealth: Payer: Self-pay

## 2015-08-08 NOTE — Telephone Encounter (Signed)
Called patient and spoke with her husband. I asked about her diabetic eye exam and he said she has one scheduled in November at [facility] and she gets them every 6 months.

## 2015-10-02 ENCOUNTER — Ambulatory Visit: Payer: Medicare Other

## 2015-10-02 ENCOUNTER — Other Ambulatory Visit: Payer: Medicare Other

## 2015-11-10 ENCOUNTER — Encounter: Payer: Self-pay | Admitting: Family Medicine

## 2015-11-10 ENCOUNTER — Ambulatory Visit (INDEPENDENT_AMBULATORY_CARE_PROVIDER_SITE_OTHER): Payer: Medicare Other | Admitting: Family Medicine

## 2015-11-10 VITALS — BP 137/84 | HR 90 | Temp 98.7°F | Ht 61.0 in | Wt 239.0 lb

## 2015-11-10 DIAGNOSIS — N183 Chronic kidney disease, stage 3 unspecified: Secondary | ICD-10-CM

## 2015-11-10 DIAGNOSIS — M8580 Other specified disorders of bone density and structure, unspecified site: Secondary | ICD-10-CM

## 2015-11-10 DIAGNOSIS — E1022 Type 1 diabetes mellitus with diabetic chronic kidney disease: Secondary | ICD-10-CM | POA: Diagnosis not present

## 2015-11-10 DIAGNOSIS — I10 Essential (primary) hypertension: Secondary | ICD-10-CM

## 2015-11-10 DIAGNOSIS — M5136 Other intervertebral disc degeneration, lumbar region: Secondary | ICD-10-CM | POA: Diagnosis not present

## 2015-11-10 DIAGNOSIS — E119 Type 2 diabetes mellitus without complications: Secondary | ICD-10-CM | POA: Diagnosis not present

## 2015-11-10 DIAGNOSIS — E785 Hyperlipidemia, unspecified: Secondary | ICD-10-CM | POA: Diagnosis not present

## 2015-11-10 DIAGNOSIS — E1122 Type 2 diabetes mellitus with diabetic chronic kidney disease: Secondary | ICD-10-CM | POA: Diagnosis not present

## 2015-11-10 DIAGNOSIS — N182 Chronic kidney disease, stage 2 (mild): Secondary | ICD-10-CM

## 2015-11-10 DIAGNOSIS — Z0000 Encounter for general adult medical examination without abnormal findings: Secondary | ICD-10-CM | POA: Diagnosis not present

## 2015-11-10 LAB — UA/M W/RFLX CULTURE, ROUTINE
BILIRUBIN UA: NEGATIVE
Glucose, UA: NEGATIVE
Leukocytes, UA: NEGATIVE
Nitrite, UA: NEGATIVE
PH UA: 5 (ref 5.0–7.5)
PROTEIN UA: NEGATIVE
RBC UA: NEGATIVE
SPEC GRAV UA: 1.02 (ref 1.005–1.030)
Urobilinogen, Ur: 0.2 mg/dL (ref 0.2–1.0)

## 2015-11-10 LAB — BAYER DCA HB A1C WAIVED: HB A1C: 7.2 % — AB (ref <7.0)

## 2015-11-10 MED ORDER — BENAZEPRIL-HYDROCHLOROTHIAZIDE 20-25 MG PO TABS: 1.0000 | ORAL_TABLET | Freq: Every day | ORAL | Status: DC

## 2015-11-10 NOTE — Assessment & Plan Note (Signed)
To have DEXA next week.

## 2015-11-10 NOTE — Assessment & Plan Note (Signed)
Interested in pain management. Will talk to back doctor and see who they recommend. Will give her a referral when she knows where she wants to go.

## 2015-11-10 NOTE — Assessment & Plan Note (Signed)
A1c down to 7.2! Continue current regimen. Continue to work on diet and exercise. Continue to monitor.

## 2015-11-10 NOTE — Progress Notes (Signed)
BP 137/84 mmHg  Pulse 90  Temp(Src) 98.7 F (37.1 C)  Ht 5\' 1"  (1.549 m)  Wt 239 lb (108.41 kg)  BMI 45.18 kg/m2  SpO2 98%   Subjective:    Patient ID: Karen Farley, female    DOB: [birth year], 70 y.o.   MRN: [id number]  HPI: Karen Farley is a 70 y.o. female presenting on 11/10/2015 for comprehensive medical examination. Current medical complaints include:  HYPERTENSION Hypertension status: stable  Satisfied with current treatment? yes Duration of hypertension: chronic BP monitoring frequency:  not checking BP medication side effects:  yes Medication compliance: excellent compliance Aspirin: no Recurrent headaches: no Visual changes: no Palpitations: no Dyspnea: no Chest pain: no Lower extremity edema: no Dizzy/lightheaded: no  DIABETES- still having some diarrhea with the metformin.  Hypoglycemic episodes:no Polydipsia/polyuria: no Visual disturbance: no Chest pain: no Paresthesias: no Glucose Monitoring: yes  Accucheck frequency: Daily 140-117 Taking Insulin?: no Blood Pressure Monitoring: not checking Retinal Examination: Up to Date Foot Exam: Up to Date Diabetic Education: Completed Pneumovax: Not up to Date Influenza: Not up to Date Aspirin: no  Chronic low back pain. Has known DJD. Makes it very hard for her to get around and move. When she is sitting she is OK for a while, but then needs to move around. When she is doing any type of physical activity, she has a lot of pain. She has seen orthopedics and they did an MRI and did an epidural which didn't help at all. That doctor advised against surgery. Had a 2nd opinion at the first week in January. He suggested 3 options- pain management, surgery or do nothing. She doesn't want to be on any pain medicine because   Eyes: Dr. Garlan Fillers Back: Dr. Sandria Manly  She currently lives with: husband Menopausal Symptoms: no  Depression Screen done today and results listed below:  Depression screen  [facility] 2/9 11/10/2015 11/10/2015 03/27/2015  Decreased Interest 0 0 0  Down, Depressed, Hopeless 0 0 0  PHQ - 2 Score 0 0 0    The patient does not have a history of falls. I did not complete a risk assessment for falls. A plan of care for falls was not documented.  Past Medical History:  Past Medical History  Diagnosis Date  . Osteopenia   . Osteoarthritis of both knees   . Diabetes mellitus without complication ([place])   . Hypertension   . Hyperlipidemia   . DDD (degenerative disc disease), lumbar   . 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 Open-angle glaucoma, mild stage     Follows with [facility]  . Degenerative disc disease, lumbar     bulging and dengerated    Surgical History:  Past Surgical History  Procedure Laterality Date  . Cholecystectomy    . Dilation and curettage of uterus    . Tonsillectomy    . Tear duct probing with strabismus repair Right     Medications:  Current Outpatient Prescriptions on File Prior to Visit  Medication Sig  . Azelaic Acid 15 % cream Apply topically 2 (two) times daily. After skin is thoroughly washed and patted dry, gently but thoroughly massage a thin film of azelaic acid cream into the affected area twice daily, in the morning and evening.  . Clobetasol Prop Emollient Base 0.05 % emollient cream Apply topically 2 (two) times daily.  Regino Schultze Bandages & Supports (MEDICAL COMPRESSION STOCKINGS) MISC by Does not apply route.  Marland Kitchen  glucose blood test strip 1 each by Other route as needed for other. Use as instructed  . latanoprost (XALATAN) 0.005 % ophthalmic solution 1 drop at bedtime.  . metformin (FORTAMET) 1000 MG (OSM) 24 hr tablet Take 1 tablet (1,000 mg total) by mouth 2 (two) times daily with a meal.  . Multiple Vitamin (MULTIVITAMIN WITH MINERALS) TABS tablet Take 1 tablet by mouth daily.   No current facility-administered medications on file prior to visit.    Allergies:  No Known Allergies  Social History:   Social History   Social History  . Marital Status: Married    Spouse Name: N/A  . Number of Children: N/A  . Years of Education: N/A   Occupational History  . Not on file.   Social History Main Topics  . Smoking status: Never Smoker   . Smokeless tobacco: Never Used  . Alcohol Use: Yes     Comment: on rare occasion  . Drug Use: No  . Sexual Activity: Not Currently   Other Topics Concern  . Not on file   Social History Narrative   History  Smoking status  . Never Smoker   Smokeless tobacco  . Never Used   History  Alcohol Use  . Yes    Comment: on rare occasion    Family History:  Family History  Problem Relation Age of Onset  . Heart disease Mother     CHF  . Cancer Mother     Breast  . Osteoporosis Mother   . Heart disease Father 82  . Cancer Brother     Colon CA- 2002- Youngest brother  . Parkinson's disease Brother     Younger Brother  . Heart disease Brother     2 brothers  . Cancer Maternal Grandmother     Breast    Past medical history, surgical history, medications, allergies, family history and social history reviewed with patient today and changes made to appropriate areas of the chart.   Review of Systems  Constitutional: Negative.   HENT: Negative.   Eyes: Negative.   Respiratory: Negative.   Cardiovascular: Negative.   Gastrointestinal: Positive for diarrhea. Negative for heartburn, nausea, vomiting, abdominal pain, constipation, blood in stool and melena.  Genitourinary: Negative.   Musculoskeletal: Positive for joint pain. Negative for myalgias, back pain, falls and neck pain.  Skin: Negative.   Neurological: Negative.   Endo/Heme/Allergies: Negative.   Psychiatric/Behavioral: Negative.    All other ROS negative except what is listed above and in the HPI.      Objective:    BP 137/84 mmHg  Pulse 90  Temp(Src) 98.7 F (37.1 C)  Ht 5\' 1"  (1.549 m)  Wt 239 lb (108.41 kg)  BMI 45.18 kg/m2  SpO2 98%  Wt Readings from Last  3 Encounters:  11/10/15 239 lb (108.41 kg)  07/07/15 236 lb (107.049 kg)  04/28/15 236 lb (107.049 kg)    Hearing Screening   125Hz  250Hz  500Hz  1000Hz  2000Hz  4000Hz  8000Hz   Right ear:   20 20 20 20    Left ear:   20 20 20 20      Visual Acuity Screening   Right eye Left eye Both eyes  Without correction:     With correction: 20/40 20/50 20/30    Physical Exam  Constitutional: She is oriented to person, place, and time. She appears well-developed and well-nourished. No distress.  HENT:  Head: Normocephalic and atraumatic.  Right Ear: Hearing, tympanic membrane, external ear and ear canal normal.  Left  Ear: Hearing, tympanic membrane, external ear and ear canal normal.  Nose: Nose normal.  Mouth/Throat: Uvula is midline, oropharynx is clear and moist and mucous membranes are normal. No oropharyngeal exudate.  Eyes: Conjunctivae, EOM and lids are normal. Pupils are equal, round, and reactive to light. Right eye exhibits no discharge. Left eye exhibits no discharge. No scleral icterus.  Neck: Normal range of motion. Neck supple. No JVD present. No tracheal deviation present. No thyromegaly present.  Cardiovascular: Normal rate, regular rhythm, normal heart sounds and intact distal pulses.  Exam reveals no gallop and no friction rub.   No murmur heard. Pulmonary/Chest: Effort normal. No stridor. No respiratory distress. She has no wheezes. She has no rales. She exhibits no tenderness.  Abdominal: Soft. Bowel sounds are normal. She exhibits no distension and no mass. There is no tenderness. There is no rebound and no guarding.  Genitourinary:  Breast and GYN exam deferred with shared decision making  Musculoskeletal: Normal range of motion. She exhibits tenderness. She exhibits no edema.  Lymphadenopathy:    She has no cervical adenopathy.  Neurological: She is alert and oriented to person, place, and time. She has normal reflexes. She displays normal reflexes. No cranial nerve deficit. She  exhibits normal muscle tone. Coordination normal.  Skin: Skin is warm, dry and intact. No rash noted. She is not diaphoretic. No erythema. No pallor.  Psychiatric: She has a normal mood and affect. Her speech is normal and behavior is normal. Judgment and thought content normal. Cognition and memory are normal.  Nursing note and vitals reviewed.   Results for orders placed or performed in visit on 11/10/15  Bayer DCA Hb A1c Waived  Result Value Ref Range   Bayer DCA Hb A1c Waived 7.2 (H) <7.0 %  UA/M w/rflx Culture, Routine  Result Value Ref Range   Specific Gravity, UA 1.020 1.005 - 1.030   pH, UA 5.0 5.0 - 7.5   Color, UA Yellow Yellow   Appearance Ur Clear Clear   Leukocytes, UA Negative Negative   Protein, UA Negative Negative/Trace   Glucose, UA Negative Negative   Ketones, UA 1+ (A) Negative   RBC, UA Negative Negative   Bilirubin, UA Negative Negative   Urobilinogen, Ur 0.2 0.2 - 1.0 mg/dL   Nitrite, UA Negative Negative      Assessment & Plan:   Problem List Items Addressed This Visit      Cardiovascular and Mediastinum   Hypertension    Under good control today. Refill given. Continue current regimen. Continue to monitor. Recheck in 3 months.       Relevant Medications   benazepril-hydrochlorthiazide (LOTENSIN HCT) 20-25 MG tablet   Other Relevant Orders   TSH   UA/M w/rflx Culture, Routine (Completed)   Comprehensive metabolic panel     Endocrine   Diabetes mellitus without complication (HCC)    123456 down to 7.2! Continue current regimen. Continue to work on diet and exercise. Continue to monitor.       Relevant Medications   benazepril-hydrochlorthiazide (LOTENSIN HCT) 20-25 MG tablet   Other Relevant Orders   Bayer DCA Hb A1c Waived (Completed)   TSH   UA/M w/rflx Culture, Routine (Completed)   Comprehensive metabolic panel     Musculoskeletal and Integument   Osteopenia    To have DEXA next week.      DDD (degenerative disc disease), lumbar     Interested in pain management. Will talk to back doctor and see who they recommend. Will  give her a referral when she knows where she wants to go.       Relevant Orders   Comprehensive metabolic panel     Genitourinary   CKD stage 2 due to type 2 diabetes mellitus (HCC)    Rechecking levels today. Continue current regimen. Continue to monitor.       Relevant Medications   benazepril-hydrochlorthiazide (LOTENSIN HCT) 20-25 MG tablet     Other   Hyperlipidemia    Rechecking levels today. Continue current regimen. Continue to monitor.       Relevant Medications   benazepril-hydrochlorthiazide (LOTENSIN HCT) 20-25 MG tablet   Other Relevant Orders   Lipid Panel w/o Chol/HDL Ratio   Comprehensive metabolic panel    Other Visit Diagnoses    Medicare annual wellness visit, subsequent    -  Primary    List of providers obtained and added to chart. Preventative care discussed. To do FOBT. Work on diet and exercise. continue to monitor.     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  Preventative Services:  AAA screening: N/A Health Risk Assessment and Personalized Prevention Plan: See below Bone Mass Measurements: Up to date Breast Cancer Screening: Ordered today CVD Screening: Done today Cervical Cancer Screening: N/A Colon Cancer Screening:  Depression Screening: Done today Diabetes Screening: Done today Glaucoma Screening: Up to date Hepatitis B  vaccine: N/A Hepatitis C screening: Declined HIV Screening: Declined Flu Vaccine: Refused today Lung cancer Screening: N/A Obesity Screening: done today Pneumonia Vaccines (2): declined at this time STI Screening: N/A  LABORATORY TESTING:  - Pap smear: not applicable  IMMUNIZATIONS:   - Tdap: Tetanus vaccination status reviewed: last tetanus booster within 10 years. - Influenza: Refused - Pneumovax: Refused - Prevnar: Refused - Zostavax vaccine: Refused  SCREENING: -Mammogram: Ordered today  - Colonoscopy: Refused  - Bone Density: Up to date  -Hearing Test: Ordered today   PATIENT COUNSELING:   Advised to take 1 mg of folate supplement per day if capable of pregnancy.   Sexuality: Discussed sexually transmitted diseases, partner selection, use of condoms, avoidance of unintended pregnancy  and contraceptive alternatives.   Advised to avoid cigarette smoking.  I discussed with the patient that most people either abstain from alcohol or drink within safe limits (<=14/week and <=4 drinks/occasion for males, <=7/weeks and <= 3 drinks/occasion for females) and that the risk for alcohol disorders and other health effects rises proportionally with the number of drinks per week and how often a drinker exceeds daily limits.  Discussed cessation/primary prevention of drug use and availability of treatment for abuse.   Diet: Encouraged to adjust caloric intake to maintain  or achieve ideal body weight, to reduce intake of dietary saturated fat and total fat, to limit sodium intake by avoiding high sodium foods and not adding table salt, and to maintain adequate dietary potassium and calcium  preferably from fresh fruits, vegetables, and low-fat dairy products.    stressed the importance of regular exercise  Injury prevention: Discussed safety belts, safety helmets, smoke detector, smoking near bedding or upholstery.   Dental health: Discussed importance of regular tooth brushing,  flossing, and dental visits.    NEXT PREVENTATIVE PHYSICAL DUE IN 1 YEAR. Return in about 3 months (around 02/08/2016).

## 2015-11-10 NOTE — Assessment & Plan Note (Signed)
Rechecking levels today. Continue current regimen. Continue to monitor.

## 2015-11-10 NOTE — Assessment & Plan Note (Signed)
Under good control today. Refill given. Continue current regimen. Continue to monitor. Recheck in 3 months.

## 2015-11-11 LAB — COMPREHENSIVE METABOLIC PANEL
A/G RATIO: 2.1 (ref 1.1–2.5)
ALT: 38 IU/L — ABNORMAL HIGH (ref 0–32)
AST: 21 IU/L (ref 0–40)
Albumin: 4.4 g/dL (ref 3.6–4.8)
Alkaline Phosphatase: 65 IU/L (ref 39–117)
BILIRUBIN TOTAL: 0.6 mg/dL (ref 0.0–1.2)
BUN/Creatinine Ratio: 15 (ref 11–26)
BUN: 15 mg/dL (ref 8–27)
CALCIUM: 9.9 mg/dL (ref 8.7–10.3)
CO2: 26 mmol/L (ref 18–29)
CREATININE: 1.03 mg/dL — AB (ref 0.57–1.00)
Chloride: 96 mmol/L (ref 96–106)
GFR, EST AFRICAN AMERICAN: 64 mL/min/{1.73_m2} (ref >59)
GFR, EST NON AFRICAN AMERICAN: 56 mL/min/{1.73_m2} — AB (ref >59)
GLOBULIN, TOTAL: 2.1 g/dL (ref 1.5–4.5)
Glucose: 143 mg/dL — ABNORMAL HIGH (ref 65–99)
POTASSIUM: 4.3 mmol/L (ref 3.5–5.2)
SODIUM: 141 mmol/L (ref 134–144)
TOTAL PROTEIN: 6.5 g/dL (ref 6.0–8.5)

## 2015-11-11 LAB — CBC WITH DIFFERENTIAL/PLATELET
BASOS ABS: 0 10*3/uL (ref 0.0–0.2)
Basos: 0 %
EOS (ABSOLUTE): 0.1 10*3/uL (ref 0.0–0.4)
EOS: 1 %
HEMATOCRIT: 44.7 % (ref 34.0–46.6)
HEMOGLOBIN: 15.1 g/dL (ref 11.1–15.9)
IMMATURE GRANULOCYTES: 0 %
Immature Grans (Abs): 0 10*3/uL (ref 0.0–0.1)
LYMPHS: 23 %
Lymphocytes Absolute: 2.1 10*3/uL (ref 0.7–3.1)
MCH: 29.6 pg (ref 26.6–33.0)
MCHC: 33.8 g/dL (ref 31.5–35.7)
MCV: 88 fL (ref 79–97)
MONOCYTES: 9 %
Monocytes Absolute: 0.8 10*3/uL (ref 0.1–0.9)
NEUTROS PCT: 67 %
Neutrophils Absolute: 6 10*3/uL (ref 1.4–7.0)
Platelets: 316 10*3/uL (ref 150–379)
RBC: 5.1 x10E6/uL (ref 3.77–5.28)
RDW: 14.1 % (ref 12.3–15.4)
WBC: 9.1 10*3/uL (ref 3.4–10.8)

## 2015-11-11 LAB — LIPID PANEL W/O CHOL/HDL RATIO
CHOLESTEROL TOTAL: 226 mg/dL — AB (ref 100–199)
HDL: 50 mg/dL (ref >39)
LDL CALC: 142 mg/dL — AB (ref 0–99)
TRIGLYCERIDES: 170 mg/dL — AB (ref 0–149)
VLDL CHOLESTEROL CAL: 34 mg/dL (ref 5–40)

## 2015-11-11 LAB — TSH: TSH: 4.67 u[IU]/mL — AB (ref 0.450–4.500)

## 2015-11-14 ENCOUNTER — Telehealth: Payer: Self-pay | Admitting: Family Medicine

## 2015-11-14 DIAGNOSIS — R7989 Other specified abnormal findings of blood chemistry: Secondary | ICD-10-CM | POA: Insufficient documentation

## 2015-11-14 NOTE — Telephone Encounter (Signed)
Called and left message with husband for patient to call me back

## 2015-11-14 NOTE — Telephone Encounter (Signed)
Pt called back. °

## 2015-11-14 NOTE — Telephone Encounter (Signed)
Called patient and discussed slightly abnormal thyroid. Will recheck it in 1 month. Appointment made for patient.

## 2015-12-06 ENCOUNTER — Ambulatory Visit
Admission: RE | Admit: 2015-12-06 | Discharge: 2015-12-06 | Disposition: A | Discharge status: Home / Self Care | Payer: Medicare Other | Source: Ambulatory Visit | Attending: Family Medicine | Admitting: Family Medicine

## 2015-12-06 ENCOUNTER — Other Ambulatory Visit: Payer: Self-pay | Admitting: Family Medicine

## 2015-12-06 DIAGNOSIS — Z13820 Encounter for screening for osteoporosis: Secondary | ICD-10-CM | POA: Insufficient documentation

## 2015-12-06 DIAGNOSIS — Z1231 Encounter for screening mammogram for malignant neoplasm of breast: Secondary | ICD-10-CM | POA: Diagnosis present

## 2015-12-06 DIAGNOSIS — Z780 Asymptomatic menopausal state: Secondary | ICD-10-CM

## 2015-12-06 DIAGNOSIS — Z1239 Encounter for other screening for malignant neoplasm of breast: Secondary | ICD-10-CM

## 2015-12-06 DIAGNOSIS — M8580 Other specified disorders of bone density and structure, unspecified site: Secondary | ICD-10-CM

## 2015-12-07 ENCOUNTER — Encounter: Payer: Self-pay | Admitting: Family Medicine

## 2015-12-14 ENCOUNTER — Telehealth: Payer: Self-pay | Admitting: Family Medicine

## 2015-12-14 DIAGNOSIS — M5136 Other intervertebral disc degeneration, lumbar region: Secondary | ICD-10-CM

## 2015-12-14 DIAGNOSIS — M170 Bilateral primary osteoarthritis of knee: Secondary | ICD-10-CM

## 2015-12-14 LAB — HM DIABETES EYE EXAM

## 2015-12-14 NOTE — Telephone Encounter (Signed)
Pt would like a referral to pain management.  Please call pt with any questions.

## 2015-12-14 NOTE — Telephone Encounter (Signed)
Forward to provider

## 2015-12-14 NOTE — Telephone Encounter (Signed)
Referral generated today.

## 2015-12-15 ENCOUNTER — Other Ambulatory Visit: Payer: Medicare Other

## 2015-12-15 DIAGNOSIS — R7989 Other specified abnormal findings of blood chemistry: Secondary | ICD-10-CM

## 2015-12-16 ENCOUNTER — Telehealth: Payer: Self-pay | Admitting: Family Medicine

## 2015-12-16 LAB — THYROID PANEL WITH TSH
FREE THYROXINE INDEX: 2.4 (ref 1.2–4.9)
T3 UPTAKE RATIO: 25 % (ref 24–39)
T4 TOTAL: 9.4 ug/dL (ref 4.5–12.0)
TSH: 2.16 u[IU]/mL (ref 0.450–4.500)

## 2015-12-16 NOTE — Telephone Encounter (Signed)
erroneous

## 2015-12-18 ENCOUNTER — Encounter: Payer: Self-pay | Admitting: Family Medicine

## 2016-01-09 ENCOUNTER — Ambulatory Visit: Payer: Medicare Other | Attending: Pain Medicine | Admitting: Pain Medicine

## 2016-01-09 ENCOUNTER — Encounter: Payer: Self-pay | Admitting: Pain Medicine

## 2016-01-09 VITALS — BP 150/77 | HR 103 | Temp 97.2°F | Resp 18 | Ht 61.0 in | Wt 236.0 lb

## 2016-01-09 DIAGNOSIS — M19041 Primary osteoarthritis, right hand: Secondary | ICD-10-CM | POA: Insufficient documentation

## 2016-01-09 DIAGNOSIS — M79605 Pain in left leg: Secondary | ICD-10-CM | POA: Diagnosis present

## 2016-01-09 DIAGNOSIS — M25562 Pain in left knee: Secondary | ICD-10-CM | POA: Insufficient documentation

## 2016-01-09 DIAGNOSIS — M25561 Pain in right knee: Secondary | ICD-10-CM | POA: Insufficient documentation

## 2016-01-09 DIAGNOSIS — E119 Type 2 diabetes mellitus without complications: Secondary | ICD-10-CM | POA: Diagnosis not present

## 2016-01-09 DIAGNOSIS — M545 Low back pain, unspecified: Secondary | ICD-10-CM | POA: Insufficient documentation

## 2016-01-09 DIAGNOSIS — M170 Bilateral primary osteoarthritis of knee: Secondary | ICD-10-CM | POA: Insufficient documentation

## 2016-01-09 DIAGNOSIS — M5136 Other intervertebral disc degeneration, lumbar region: Secondary | ICD-10-CM | POA: Insufficient documentation

## 2016-01-09 DIAGNOSIS — M19042 Primary osteoarthritis, left hand: Secondary | ICD-10-CM | POA: Insufficient documentation

## 2016-01-09 DIAGNOSIS — M1288 Other specific arthropathies, not elsewhere classified, other specified site: Secondary | ICD-10-CM | POA: Insufficient documentation

## 2016-01-09 DIAGNOSIS — M47816 Spondylosis without myelopathy or radiculopathy, lumbar region: Secondary | ICD-10-CM

## 2016-01-09 DIAGNOSIS — M533 Sacrococcygeal disorders, not elsewhere classified: Secondary | ICD-10-CM

## 2016-01-09 DIAGNOSIS — M79604 Pain in right leg: Secondary | ICD-10-CM | POA: Diagnosis present

## 2016-01-09 DIAGNOSIS — M51369 Other intervertebral disc degeneration, lumbar region without mention of lumbar back pain or lower extremity pain: Secondary | ICD-10-CM

## 2016-01-09 DIAGNOSIS — M259 Joint disorder, unspecified: Secondary | ICD-10-CM | POA: Insufficient documentation

## 2016-01-09 NOTE — Progress Notes (Signed)
Safety precautions to be maintained throughout the outpatient stay will include: orient to surroundings, keep bed in low position, maintain call bell within reach at all times, provide assistance with transfer out of bed and ambulation.  

## 2016-01-09 NOTE — Patient Instructions (Addendum)
PLAN    Continue present medication  Omar facet, medial branch nerve, blocks to be performed at time of return appointment  F/U PCP Dr.Megan Wynetta Emery  for evaliation of  BP and general medical  condition  F/U surgical evaluation. Follow-up with Dr. Arnoldo Morale as discussed  F/U neurological evaluation. May consider pending follow-up evaluations  May consider radiofrequency rhizolysis or intraspinal procedures pending response to present treatment and F/U evaluation   Patient to call [facility] should patient have concerns prior to scheduled return appointment. Pain Management Discharge Instructions  General Discharge Instructions :  If you need to reach your doctor call: Monday-Friday 8:00 am - 4:00 pm at [phone] or toll free [phone].  After clinic hours [phone] to have operator reach doctor.  Bring all of your medication bottles to all your appointments in the pain clinic.  To cancel or reschedule your appointment with Pain Management please remember to call 24 hours in advance to avoid a fee.  Refer to the educational materials which you have been given on: General Risks, I had my Procedure. Discharge Instructions, Post Sedation.  Post Procedure Instructions:  The drugs you were given will stay in your system until tomorrow, so for the next 24 hours you should not drive, make any legal decisions or drink any alcoholic beverages.  You may eat anything you prefer, but it is better to start with liquids then soups and crackers, and gradually work up to solid foods.  Please notify your doctor immediately if you have any unusual bleeding, trouble breathing or pain that is not related to your normal pain.  Depending on the type of procedure that was done, some parts of your body may feel week and/or numb.  This usually clears up by tonight or the next day.  Walk with the use of an assistive device or accompanied by an adult for the 24 hours.  You may use ice  on the affected area for the first 24 hours.  Put ice in a Ziploc bag and cover with a towel and place against area 15 minutes on 15 minutes off.  You may switch to heat after 24 hours.GENERAL RISKS AND COMPLICATIONS  What are the risk, side effects and possible complications? Generally speaking, most procedures are safe.  However, with any procedure there are risks, side effects, and the possibility of complications.  The risks and complications are dependent upon the sites that are lesioned, or the type of nerve block to be performed.  The closer the procedure is to the spine, the more serious the risks are.  Great care is taken when placing the radio frequency needles, block needles or lesioning probes, but sometimes complications can occur. 1. Infection: Any time there is an injection through the skin, there is a risk of infection.  This is why sterile conditions are used for these blocks.  There are four possible types of infection. 1. Localized skin infection. 2. Central Nervous System Infection-This can be in the form of Meningitis, which can be deadly. 3. Epidural Infections-This can be in the form of an epidural abscess, which can cause pressure inside of the spine, causing compression of the spinal cord with subsequent paralysis. This would require an emergency surgery to decompress, and there are no guarantees that the patient would recover from the paralysis. 4. Discitis-This is an infection of the intervertebral discs.  It occurs in about 1% of discography procedures.  It is difficult to treat and it may lead to surgery.  2. Pain: the needles have to go through skin and soft tissues, will cause soreness.       3. Damage to internal structures:  The nerves to be lesioned may be near blood vessels or    other nerves which can be potentially damaged.       4. Bleeding: Bleeding is more common if the patient is taking blood thinners such as  aspirin, Coumadin, Ticiid, Plavix, etc., or if  he/she have some genetic predisposition  such as hemophilia. Bleeding into the spinal canal can cause compression of the spinal  cord with subsequent paralysis.  This would require an emergency surgery to  decompress and there are no guarantees that the patient would recover from the  paralysis.       5. Pneumothorax:  Puncturing of a lung is a possibility, every time a needle is introduced in  the area of the chest or upper back.  Pneumothorax refers to free air around the  collapsed lung(s), inside of the thoracic cavity (chest cavity).  Another two possible  complications related to a similar event would include: Hemothorax and Chylothorax.   These are variations of the Pneumothorax, where instead of air around the collapsed  lung(s), you may have blood or chyle, respectively.       6. Spinal headaches: They may occur with any procedures in the area of the spine.       7. Persistent CSF (Cerebro-Spinal Fluid) leakage: This is a rare problem, but may occur  with prolonged intrathecal or epidural catheters either due to the formation of a fistulous  track or a dural tear.       8. Nerve damage: By working so close to the spinal cord, there is always a possibility of  nerve damage, which could be as serious as a permanent spinal cord injury with  paralysis.       9. Death:  Although rare, severe deadly allergic reactions known as "Anaphylactic  reaction" can occur to any of the medications used.      10. Worsening of the symptoms:  We can always make thing worse.  What are the chances of something like this happening? Chances of any of this occuring are extremely low.  By statistics, you have more of a chance of getting killed in a motor vehicle accident: while driving to the hospital than any of the above occurring .  Nevertheless, you should be aware that they are possibilities.  In general, it is similar to taking a shower.  Everybody knows that you can slip, hit your head and get killed.  Does that mean  that you should not shower again?  Nevertheless always keep in mind that statistics do not mean anything if you happen to be on the wrong side of them.  Even if a procedure has a 1 (one) in a 1,000,000 (million) chance of going wrong, it you happen to be that one..Also, keep in mind that by statistics, you have more of a chance of having something go wrong when taking medications.  Who should not have this procedure? If you are on a blood thinning medication (e.g. Coumadin, Plavix, see list of "Blood Thinners"), or if you have an active infection going on, you should not have the procedure.  If you are taking any blood thinners, please inform your physician.  How should I prepare for this procedure?  Do not eat or drink anything at least six hours prior to the procedure.  Bring a driver  with you .  It cannot be a taxi.  Come accompanied by an adult that can drive you back, and that is strong enough to help you if your legs get weak or numb from the local anesthetic.  Take all of your medicines the morning of the procedure with just enough water to swallow them.  If you have diabetes, make sure that you are scheduled to have your procedure done first thing in the morning, whenever possible.  If you have diabetes, take only half of your insulin dose and notify our nurse that you have done so as soon as you arrive at the clinic.  If you are diabetic, but only take blood sugar pills (oral hypoglycemic), then do not take them on the morning of your procedure.  You may take them after you have had the procedure.  Do not take aspirin or any aspirin-containing medications, at least eleven (11) days prior to the procedure.  They may prolong bleeding.  Wear loose fitting clothing that may be easy to take off and that you would not mind if it got stained with Betadine or blood.  Do not wear any jewelry or perfume  Remove any nail coloring.  It will interfere with some of our monitoring  equipment.  NOTE: Remember that this is not meant to be interpreted as a complete list of all possible complications.  Unforeseen problems may occur.  BLOOD THINNERS The following drugs contain aspirin or other products, which can cause increased bleeding during surgery and should not be taken for 2 weeks prior to and 1 week after surgery.  If you should need take something for relief of minor pain, you may take acetaminophen which is found in Tylenol,m Datril, Anacin-3 and Panadol. It is not blood thinner. The products listed below are.  Do not take any of the products listed below in addition to any listed on your instruction sheet.  A.P.C or A.P.C with Codeine Codeine Phosphate Capsules #3 Ibuprofen Ridaura  ABC compound Congesprin Imuran rimadil  Advil Cope Indocin Robaxisal  Alka-Seltzer Effervescent Pain Reliever and Antacid Coricidin or Coricidin-D  Indomethacin Rufen  Alka-Seltzer plus Cold Medicine Cosprin Ketoprofen S-A-C Tablets  Anacin Analgesic Tablets or Capsules Coumadin Korlgesic Salflex  Anacin Extra Strength Analgesic tablets or capsules CP-2 Tablets Lanoril Salicylate  Anaprox Cuprimine Capsules Levenox Salocol  Anexsia-D Dalteparin Magan Salsalate  Anodynos Darvon compound Magnesium Salicylate Sine-off  Ansaid Dasin Capsules Magsal Sodium Salicylate  Anturane Depen Capsules Marnal Soma  APF Arthritis pain formula Dewitt's Pills Measurin Stanback  Argesic Dia-Gesic Meclofenamic Sulfinpyrazone  Arthritis Bayer Timed Release Aspirin Diclofenac Meclomen Sulindac  Arthritis pain formula Anacin Dicumarol Medipren Supac  Analgesic (Safety coated) Arthralgen Diffunasal Mefanamic Suprofen  Arthritis Strength Bufferin Dihydrocodeine Mepro Compound Suprol  Arthropan liquid Dopirydamole Methcarbomol with Aspirin Synalgos  ASA tablets/Enseals Disalcid Micrainin Tagament  Ascriptin Doan's Midol Talwin  Ascriptin A/D Dolene Mobidin Tanderil  Ascriptin Extra Strength Dolobid  Moblgesic Ticlid  Ascriptin with Codeine Doloprin or Doloprin with Codeine Momentum Tolectin  Asperbuf Duoprin Mono-gesic Trendar  Aspergum Duradyne Motrin or Motrin IB Triminicin  Aspirin plain, buffered or enteric coated Durasal Myochrisine Trigesic  Aspirin Suppositories Easprin Nalfon Trillsate  Aspirin with Codeine Ecotrin Regular or Extra Strength Naprosyn Uracel  Atromid-S Efficin Naproxen Ursinus  Auranofin Capsules Elmiron Neocylate Vanquish  Axotal Emagrin Norgesic Verin  Azathioprine Empirin or Empirin with Codeine Normiflo Vitamin E  Azolid Emprazil Nuprin Voltaren  Bayer Aspirin plain, buffered or children's or timed BC Tablets or powders  Encaprin Orgaran Warfarin Sodium  Buff-a-Comp Enoxaparin Orudis Zorpin  Buff-a-Comp with Codeine Equegesic Os-Cal-Gesic   Buffaprin Excedrin plain, buffered or Extra Strength Oxalid   Bufferin Arthritis Strength Feldene Oxphenbutazone   Bufferin plain or Extra Strength Feldene Capsules Oxycodone with Aspirin   Bufferin with Codeine Fenoprofen Fenoprofen Pabalate or Pabalate-SF   Buffets II Flogesic Panagesic   Buffinol plain or Extra Strength Florinal or Florinal with Codeine Panwarfarin   Buf-Tabs Flurbiprofen Penicillamine   Butalbital Compound Four-way cold tablets Penicillin   Butazolidin Fragmin Pepto-Bismol   Carbenicillin Geminisyn Percodan   Carna Arthritis Reliever Geopen Persantine   Carprofen Gold's salt Persistin   Chloramphenicol Goody's Phenylbutazone   Chloromycetin Haltrain Piroxlcam   Clmetidine heparin Plaquenil   Cllnoril Hyco-pap Ponstel   Clofibrate Hydroxy chloroquine Propoxyphen         Before stopping any of these medications, be sure to consult the physician who ordered them.  Some, such as Coumadin (Warfarin) are ordered to prevent or treat serious conditions such as "deep thrombosis", "pumonary embolisms", and other heart problems.  The amount of time that you may need off of the medication may also vary with  the medication and the reason for which you were taking it.  If you are taking any of these medications, please make sure you notify your pain physician before you undergo any procedures.         Facet Joint Block The facet joints connect the bones of the spine (vertebrae). They make it possible for you to bend, twist, and make other movements with your spine. They also prevent you from overbending, overtwisting, and making other excessive movements.  A facet joint block is a procedure where a numbing medicine (anesthetic) is injected into a facet joint. Often, a type of anti-inflammatory medicine called a steroid is also injected. A facet joint block may be done for two reasons:  2. Diagnosis. A facet joint block may be done as a test to see whether neck or back pain is caused by a worn-down or infected facet joint. If the pain gets better after a facet joint block, it means the pain is probably coming from the facet joint. If the pain does not get better, it means the pain is probably not coming from the facet joint.  3. Therapy. A facet joint block may be done to relieve neck or back pain caused by a facet joint. A facet joint block is only done as a therapy if the pain does not improve with medicine, exercise programs, physical therapy, and other forms of pain management. LET [facility] CARE PROVIDER KNOW ABOUT:   Any allergies you have.   All medicines you are taking, including vitamins, herbs, eyedrops, and over-the-counter medicines and creams.   Previous problems you or members of your family have had with the use of anesthetics.   Any blood disorders you have had.   Other health problems you have. RISKS AND COMPLICATIONS Generally, having a facet joint block is safe. However, as with any procedure, complications can occur. Possible complications associated with having a facet joint block include:   Bleeding.   Injury to a nerve near the injection site.   Pain at the  injection site.   Weakness or numbness in areas controlled by nerves near the injection site.   Infection.   Temporary fluid retention.   Allergic reaction to anesthetics or medicines used during the procedure. BEFORE THE PROCEDURE   Follow your health care provider's instructions if you are  taking dietary supplements or medicines. You may need to stop taking them or reduce your dosage.   Do not take any new dietary supplements or medicines without asking your health care provider first.   Follow your health care provider's instructions about eating and drinking before the procedure. You may need to stop eating and drinking several hours before the procedure.   Arrange to have an adult drive you home after the procedure. PROCEDURE 12. You may need to remove your clothing and dress in an open-back gown so that your health care provider can access your spine.  13. The procedure will be done while you are lying on an X-ray table. Most of the time you will be asked to lie on your stomach, but you may be asked to lie in a different position if an injection will be made in your neck.  14. Special machines will be used to monitor your oxygen levels, heart rate, and blood pressure.  15. If an injection will be made in your neck, an intravenous (IV) tube will be inserted into one of your veins. Fluids and medicine will flow directly into your body through the IV tube.  16. The area over the facet joint where the injection will be made will be cleaned with an antiseptic soap. The surrounding skin will be covered with sterile drapes.  17. An anesthetic will be applied to your skin to make the injection area numb. You may feel a temporary stinging or burning sensation.  18. A video X-ray machine will be used to locate the joint. A contrast dye may be injected into the facet joint area to help with locating the joint.  19. When the joint is located, an anesthetic medicine will be injected  into the joint through the needle.  36. Your health care provider will ask you whether you feel pain relief. If you do feel relief, a steroid may be injected to provide pain relief for a longer period of time. If you do not feel relief or feel only partial relief, additional injections of an anesthetic may be made in other facet joints.  21. The needle will be removed, the skin will be cleansed, and bandages will be applied.  AFTER THE PROCEDURE   You will be observed for 15-30 minutes before being allowed to go home. Do not drive. Have an adult drive you or take a taxi or public transportation instead.   If you feel pain relief, the pain will return in several hours or days when the anesthetic wears off.   You may feel pain relief 2-14 days after the procedure. The amount of time this relief lasts varies from person to person.   It is normal to feel some tenderness over the injected area(s) for 2 days following the procedure.   If you have diabetes, you may have a temporary increase in blood sugar.   This information is not intended to replace advice given to you by your health care provider. Make sure you discuss any questions you have with your health care provider.   Document Released: 02/26/2007 Document Revised: 10/28/2014 Document Reviewed: 07/27/2012 [employer] Interactive Patient Education 2016 [place]  What are the risk, side effects and possible complications? Generally speaking, most procedures are safe.  However, with any procedure there are risks, side effects, and the possibility of complications.  The risks and complications are dependent upon the sites that are lesioned, or the type of nerve block to be performed.  The closer the procedure is to the spine, the more serious the risks are.  Great care is taken when placing the radio frequency needles, block needles or lesioning probes, but sometimes complications can  occur. 1. Infection: Any time there is an injection through the skin, there is a risk of infection.  This is why sterile conditions are used for these blocks.  There are four possible types of infection. 1. Localized skin infection. 2. Central Nervous System Infection-This can be in the form of Meningitis, which can be deadly. 3. Epidural Infections-This can be in the form of an epidural abscess, which can cause pressure inside of the spine, causing compression of the spinal cord with subsequent paralysis. This would require an emergency surgery to decompress, and there are no guarantees that the patient would recover from the paralysis. 4. Discitis-This is an infection of the intervertebral discs.  It occurs in about 1% of discography procedures.  It is difficult to treat and it may lead to surgery.        2. Pain: the needles have to go through skin and soft tissues, will cause soreness.       3. Damage to internal structures:  The nerves to be lesioned may be near blood vessels or    other nerves which can be potentially damaged.       4. Bleeding: Bleeding is more common if the patient is taking blood thinners such as  aspirin, Coumadin, Ticiid, Plavix, etc., or if he/she have some genetic predisposition  such as hemophilia. Bleeding into the spinal canal can cause compression of the spinal  cord with subsequent paralysis.  This would require an emergency surgery to  decompress and there are no guarantees that the patient would recover from the  paralysis.       5. Pneumothorax:  Puncturing of a lung is a possibility, every time a needle is introduced in  the area of the chest or upper back.  Pneumothorax refers to free air around the  collapsed lung(s), inside of the thoracic cavity (chest cavity).  Another two possible  complications related to a similar event would include: Hemothorax and Chylothorax.   These are variations of the Pneumothorax, where instead of air around the collapsed  lung(s),  you may have blood or chyle, respectively.       6. Spinal headaches: They may occur with any procedures in the area of the spine.       7. Persistent CSF (Cerebro-Spinal Fluid) leakage: This is a rare problem, but may occur  with prolonged intrathecal or epidural catheters either due to the formation of a fistulous  track or a dural tear.       8. Nerve damage: By working so close to the spinal cord, there is always a possibility of  nerve damage, which could be as serious as a permanent spinal cord injury with  paralysis.       9. Death:  Although rare, severe deadly allergic reactions known as "Anaphylactic  reaction" can occur to any of the medications used.      10. Worsening of the symptoms:  We can always make thing worse.  What are the chances of something like this happening? Chances of any of this occuring are extremely low.  By statistics, you have more of a chance of getting killed in a motor vehicle accident: while driving to the hospital than any of the above occurring .  Nevertheless, you should be aware that they are  possibilities.  In general, it is similar to taking a shower.  Everybody knows that you can slip, hit your head and get killed.  Does that mean that you should not shower again?  Nevertheless always keep in mind that statistics do not mean anything if you happen to be on the wrong side of them.  Even if a procedure has a 1 (one) in a 1,000,000 (million) chance of going wrong, it you happen to be that one..Also, keep in mind that by statistics, you have more of a chance of having something go wrong when taking medications.  Who should not have this procedure? If you are on a blood thinning medication (e.g. Coumadin, Plavix, see list of "Blood Thinners"), or if you have an active infection going on, you should not have the procedure.  If you are taking any blood thinners, please inform your physician.  How should I prepare for this procedure?  Do not eat or drink anything at  least six hours prior to the procedure.  Bring a driver with you .  It cannot be a taxi.  Come accompanied by an adult that can drive you back, and that is strong enough to help you if your legs get weak or numb from the local anesthetic.  Take all of your medicines the morning of the procedure with just enough water to swallow them.  If you have diabetes, make sure that you are scheduled to have your procedure done first thing in the morning, whenever possible.  If you have diabetes, take only half of your insulin dose and notify our nurse that you have done so as soon as you arrive at the clinic.  If you are diabetic, but only take blood sugar pills (oral hypoglycemic), then do not take them on the morning of your procedure.  You may take them after you have had the procedure.  Do not take aspirin or any aspirin-containing medications, at least eleven (11) days prior to the procedure.  They may prolong bleeding.  Wear loose fitting clothing that may be easy to take off and that you would not mind if it got stained with Betadine or blood.  Do not wear any jewelry or perfume  Remove any nail coloring.  It will interfere with some of our monitoring equipment.  NOTE: Remember that this is not meant to be interpreted as a complete list of all possible complications.  Unforeseen problems may occur.  BLOOD THINNERS The following drugs contain aspirin or other products, which can cause increased bleeding during surgery and should not be taken for 2 weeks prior to and 1 week after surgery.  If you should need take something for relief of minor pain, you may take acetaminophen which is found in Tylenol,m Datril, Anacin-3 and Panadol. It is not blood thinner. The products listed below are.  Do not take any of the products listed below in addition to any listed on your instruction sheet.  A.P.C or A.P.C with Codeine Codeine Phosphate Capsules #3 Ibuprofen Ridaura  ABC compound Congesprin Imuran  rimadil  Advil Cope Indocin Robaxisal  Alka-Seltzer Effervescent Pain Reliever and Antacid Coricidin or Coricidin-D  Indomethacin Rufen  Alka-Seltzer plus Cold Medicine Cosprin Ketoprofen S-A-C Tablets  Anacin Analgesic Tablets or Capsules Coumadin Korlgesic Salflex  Anacin Extra Strength Analgesic tablets or capsules CP-2 Tablets Lanoril Salicylate  Anaprox Cuprimine Capsules Levenox Salocol  Anexsia-D Dalteparin Magan Salsalate  Anodynos Darvon compound Magnesium Salicylate Sine-off  Ansaid Dasin Capsules Magsal Sodium Salicylate  Anturane  Depen Capsules Marnal Soma  APF Arthritis pain formula Dewitt's Pills Measurin Stanback  Argesic Dia-Gesic Meclofenamic Sulfinpyrazone  Arthritis Bayer Timed Release Aspirin Diclofenac Meclomen Sulindac  Arthritis pain formula Anacin Dicumarol Medipren Supac  Analgesic (Safety coated) Arthralgen Diffunasal Mefanamic Suprofen  Arthritis Strength Bufferin Dihydrocodeine Mepro Compound Suprol  Arthropan liquid Dopirydamole Methcarbomol with Aspirin Synalgos  ASA tablets/Enseals Disalcid Micrainin Tagament  Ascriptin Doan's Midol Talwin  Ascriptin A/D Dolene Mobidin Tanderil  Ascriptin Extra Strength Dolobid Moblgesic Ticlid  Ascriptin with Codeine Doloprin or Doloprin with Codeine Momentum Tolectin  Asperbuf Duoprin Mono-gesic Trendar  Aspergum Duradyne Motrin or Motrin IB Triminicin  Aspirin plain, buffered or enteric coated Durasal Myochrisine Trigesic  Aspirin Suppositories Easprin Nalfon Trillsate  Aspirin with Codeine Ecotrin Regular or Extra Strength Naprosyn Uracel  Atromid-S Efficin Naproxen Ursinus  Auranofin Capsules Elmiron Neocylate Vanquish  Axotal Emagrin Norgesic Verin  Azathioprine Empirin or Empirin with Codeine Normiflo Vitamin E  Azolid Emprazil Nuprin Voltaren  Bayer Aspirin plain, buffered or children's or timed BC Tablets or powders Encaprin Orgaran Warfarin Sodium  Buff-a-Comp Enoxaparin Orudis Zorpin  Buff-a-Comp with  Codeine Equegesic Os-Cal-Gesic   Buffaprin Excedrin plain, buffered or Extra Strength Oxalid   Bufferin Arthritis Strength Feldene Oxphenbutazone   Bufferin plain or Extra Strength Feldene Capsules Oxycodone with Aspirin   Bufferin with Codeine Fenoprofen Fenoprofen Pabalate or Pabalate-SF   Buffets II Flogesic Panagesic   Buffinol plain or Extra Strength Florinal or Florinal with Codeine Panwarfarin   Buf-Tabs Flurbiprofen Penicillamine   Butalbital Compound Four-way cold tablets Penicillin   Butazolidin Fragmin Pepto-Bismol   Carbenicillin Geminisyn Percodan   Carna Arthritis Reliever Geopen Persantine   Carprofen Gold's salt Persistin   Chloramphenicol Goody's Phenylbutazone   Chloromycetin Haltrain Piroxlcam   Clmetidine heparin Plaquenil   Cllnoril Hyco-pap Ponstel   Clofibrate Hydroxy chloroquine Propoxyphen         Before stopping any of these medications, be sure to consult the physician who ordered them.  Some, such as Coumadin (Warfarin) are ordered to prevent or treat serious conditions such as "deep thrombosis", "pumonary embolisms", and other heart problems.  The amount of time that you may need off of the medication may also vary with the medication and the reason for which you were taking it.  If you are taking any of these medications, please make sure you notify your pain physician before you undergo any procedures.         Facet Joint Block The facet joints connect the bones of the spine (vertebrae). They make it possible for you to bend, twist, and make other movements with your spine. They also prevent you from overbending, overtwisting, and making other excessive movements.  A facet joint block is a procedure where a numbing medicine (anesthetic) is injected into a facet joint. Often, a type of anti-inflammatory medicine called a steroid is also injected. A facet joint block may be done for two reasons:  4. Diagnosis. A facet joint block may be done as a test to  see whether neck or back pain is caused by a worn-down or infected facet joint. If the pain gets better after a facet joint block, it means the pain is probably coming from the facet joint. If the pain does not get better, it means the pain is probably not coming from the facet joint.  5. Therapy. A facet joint block may be done to relieve neck or back pain caused by a facet joint. A facet joint block is only done as  a therapy if the pain does not improve with medicine, exercise programs, physical therapy, and other forms of pain management. LET [facility] CARE PROVIDER KNOW ABOUT:   Any allergies you have.   All medicines you are taking, including vitamins, herbs, eyedrops, and over-the-counter medicines and creams.   Previous problems you or members of your family have had with the use of anesthetics.   Any blood disorders you have had.   Other health problems you have. RISKS AND COMPLICATIONS Generally, having a facet joint block is safe. However, as with any procedure, complications can occur. Possible complications associated with having a facet joint block include:   Bleeding.   Injury to a nerve near the injection site.   Pain at the injection site.   Weakness or numbness in areas controlled by nerves near the injection site.   Infection.   Temporary fluid retention.   Allergic reaction to anesthetics or medicines used during the procedure. BEFORE THE PROCEDURE   Follow your health care provider's instructions if you are taking dietary supplements or medicines. You may need to stop taking them or reduce your dosage.   Do not take any new dietary supplements or medicines without asking your health care provider first.   Follow your health care provider's instructions about eating and drinking before the procedure. You may need to stop eating and drinking several hours before the procedure.   Arrange to have an adult drive you home after the  procedure. PROCEDURE 33. You may need to remove your clothing and dress in an open-back gown so that your health care provider can access your spine.  34. The procedure will be done while you are lying on an X-ray table. Most of the time you will be asked to lie on your stomach, but you may be asked to lie in a different position if an injection will be made in your neck.  75. Special machines will be used to monitor your oxygen levels, heart rate, and blood pressure.  36. If an injection will be made in your neck, an intravenous (IV) tube will be inserted into one of your veins. Fluids and medicine will flow directly into your body through the IV tube.  37. The area over the facet joint where the injection will be made will be cleaned with an antiseptic soap. The surrounding skin will be covered with sterile drapes.  38. An anesthetic will be applied to your skin to make the injection area numb. You may feel a temporary stinging or burning sensation.  39. A video X-ray machine will be used to locate the joint. A contrast dye may be injected into the facet joint area to help with locating the joint.  40. When the joint is located, an anesthetic medicine will be injected into the joint through the needle.  17. Your health care provider will ask you whether you feel pain relief. If you do feel relief, a steroid may be injected to provide pain relief for a longer period of time. If you do not feel relief or feel only partial relief, additional injections of an anesthetic may be made in other facet joints.  42. The needle will be removed, the skin will be cleansed, and bandages will be applied.  AFTER THE PROCEDURE   You will be observed for 15-30 minutes before being allowed to go home. Do not drive. Have an adult drive you or take a taxi or public transportation instead.   If you feel pain relief,  the pain will return in several hours or days when the anesthetic wears off.   You may feel  pain relief 2-14 days after the procedure. The amount of time this relief lasts varies from person to person.   It is normal to feel some tenderness over the injected area(s) for 2 days following the procedure.   If you have diabetes, you may have a temporary increase in blood sugar.   This information is not intended to replace advice given to you by your health care provider. Make sure you discuss any questions you have with your health care provider.   Document Released: 02/26/2007 Document Revised: 10/28/2014 Document Reviewed: 07/27/2012 Elsevier Interactive Patient Education [employer].

## 2016-01-09 NOTE — Progress Notes (Signed)
Subjective:    Patient ID: Karen Farley, female    DOB: [birth year], 70 y.o.   MRN: [id number]  HPI  The patient is a 70 year old female who comes to pain management at the request of Dr. Newman Pies for further evaluation and treatment of pain involving the lower back and lower extremity regions. The patient is undergone evaluation by Dr. Arnoldo Morale and is without recommendation for surgical intervention of the lumbar region at this time. Discussed patient's condition and reviewed patient's MRI findings. Explained to patient the evidence of lumbar facet arthritis being present at several levels of the patient's spine. With the use of charts, skeletal, and review of MRI we explain procedure of lumbar facet, medial branch nerve blocks to be performed at time return appointment in attempt to decrease severity of patient's symptoms, minimize progression of patient's symptoms, and avoid the need for more involved treatment. The patient is also discussed fusion with Dr. Arnoldo Morale and at the present time all preferred to avoid patient undergoing fusion. We will proceed with lumbar facet, medial branch nerve, blocks at time of return appointment as discussed and will consider additional modifications of treatment regimen pending response to treatment and follow-up evaluation. The patient states that the lower back pain involves the lower back region with pain radiating through the buttocks and without continuation to the knees at this time. The patient described her pain is constant disabling pain and the pain increased with motion sitting standing walking. The patient stated that the pain had decreased with lying down and sleeping. The patient states that she has to bend over on shopping cart and lean forward when she is in grocery store to be able to walk for any distance. The patient stated that the pain does interfere with her ability to go on trips and to enjoy other activities . We will proceed with lumbar  facet, medial branch nerve, blocks at time return appointment as discussed All agreed to suggested treatment plan  Review of Systems    Cardiovascular: High blood pressure  Pulmonary: Unremarkable  Neurological: Unremarkable  Psychological: Unremarkable  Gastrointestinal: Unremarkable  Genitourinary: Unremarkable  Hematologic: Unremarkable  Endocrine: Diabetes mellitus  Rheumatological: Osteoarthritis of knees and fingers  Musculoskeletal: Unremarkable  Other significant: Unremarkable      Objective:   Physical Exam   There was tenderness to palpation of the splenius capitis and occipitalis musculature regions of mild degree. There was mild tenderness of the cervical facet cervical paraspinal musculature regions. Palpation over the thoracic facet thoracic paraspinal musculature region was with mild tenderness to palpation with mild to moderate muscle spasm in the lower thoracic region noted. Palpation of the acromioclavicular and glenohumeral joint region was with mild discomfort. The patient appeared to be with slightly decreased grip strength and Tinel and Phalen's maneuver were without increase of pain of significant degree. There was tenderness over the lumbar facet lumbar paraspinal musculature region a moderate degree there was moderate to moderately severe increase of pain with rotation to the left compared to the right. Extension and palpation of the lumbar facets on the left reproduce more significant pain compared to the right. There was tenderness over the PSIS and PII S region as well as the gluteal and piriformis musculature regions. Straight leg raising was tolerates approximately 30 without a definite increase of pain with dorsiflexion noted. The knees were tenderness to palpation with crepitus of the knees noted with negative anterior and posterior drawer signs without ballottement of the patellas. There was no  definite sensory deficit or dermatomal  distribution of the lower extremities noted. Palpation of the greater trochanteric region iliotibial band region was with moderate tenderness to palpation. The patient was with negative clonus and negative Homans the abdomen was nontender with no costovertebral tenderness noted. The predominant portion of patient's pain was reproduced with palpation of the lumbar facet lumbar paraspinal musculature region left side greater than the right side.     Assessment & Plan:   PRIOR REPORT IMPORTED FROM AN EXTERNAL SYSTEM  CLINICAL DATA: Chronic low back and bilateral knee pain with  Degenerative disc disease lumbar spine      EXAM:  MRI LUMBAR SPINE WITHOUT CONTRAST   TECHNIQUE:  Multiplanar, multisequence MR imaging of the lumbar spine was  performed. No intravenous contrast was administered.   COMPARISON: None.   FINDINGS:  Vertebral body height is maintained. Trace anterolisthesis L4 on L5  due to facet arthropathy is noted. 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 The conus medullaris is  normal in signal and position. Imaged intra-abdominal contents are  unremarkable.   The T10-11 and T11-12 levels are imaged in the sagittal plane only  and negative.   T12-L1: Tiny central protrusion without central canal or foraminal  narrowing is identified.   L1-2: Negative.   L2-3: Shallow disc bulge without central canal or foraminal  narrowing.   L3-4: Moderate facet arthropathy is identified. No disc bulge or  protrusion. The central canal and foramina are widely patent.   L4-5: Advanced bilateral facet degenerative change is present. The  disc is slightly uncovered but there is no bulge or protrusion. The  central canal and foramina are widely patent.   L5-S1: Bilateral facet arthropathy is present. Minimal disc bulge  and endplate spur without central canal or foraminal stenosis.   IMPRESSION:   Negative for central canal or foraminal narrowing with mild  degenerative disc disease as described above. Multilevel facet  arthropathy is present and appears worst at L4-5 and L5-S1   Lumbar facet syndrome  Sacroiliac joint dysfunction   Osteoarthritis of knees and fingers   Diabetes mellitus      PLAN    Continue present medication NO CYMBALTA  Lumbar facet, medial branch nerve, blocks to be performed at time of return appointment  F/U PCP Dr.Megan Wynetta Emery  for evaliation of  BP and general medical  condition  F/U surgical evaluation. Follow-up with Dr. Arnoldo Morale as discussed  F/U neurological evaluation. May consider pending follow-up evaluations  May consider radiofrequency rhizolysis or intraspinal procedures pending response to present treatment and F/U evaluation   Patient to call Pain Management Center should patient have concerns prior to scheduled return appointment.

## 2016-01-16 ENCOUNTER — Other Ambulatory Visit: Payer: Self-pay | Admitting: Pain Medicine

## 2016-01-17 ENCOUNTER — Ambulatory Visit: Payer: Medicare Other | Attending: Pain Medicine | Admitting: Pain Medicine

## 2016-01-17 ENCOUNTER — Encounter: Payer: Self-pay | Admitting: Pain Medicine

## 2016-01-17 VITALS — BP 133/90 | HR 80 | Temp 97.7°F | Resp 20 | Ht 61.0 in | Wt 236.0 lb

## 2016-01-17 DIAGNOSIS — M545 Low back pain: Secondary | ICD-10-CM | POA: Insufficient documentation

## 2016-01-17 DIAGNOSIS — M79606 Pain in leg, unspecified: Secondary | ICD-10-CM | POA: Insufficient documentation

## 2016-01-17 DIAGNOSIS — M5136 Other intervertebral disc degeneration, lumbar region: Secondary | ICD-10-CM | POA: Diagnosis not present

## 2016-01-17 DIAGNOSIS — M47816 Spondylosis without myelopathy or radiculopathy, lumbar region: Secondary | ICD-10-CM

## 2016-01-17 DIAGNOSIS — M1288 Other specific arthropathies, not elsewhere classified, other specified site: Secondary | ICD-10-CM | POA: Insufficient documentation

## 2016-01-17 DIAGNOSIS — M533 Sacrococcygeal disorders, not elsewhere classified: Secondary | ICD-10-CM

## 2016-01-17 MED ORDER — TRIAMCINOLONE ACETONIDE 40 MG/ML IJ SUSP: 40.0000 mg | Freq: Once | INTRAMUSCULAR | Status: DC

## 2016-01-17 MED ORDER — CEFUROXIME AXETIL 250 MG PO TABS: 250.0000 mg | ORAL_TABLET | Freq: Two times a day (BID) | ORAL | Status: DC

## 2016-01-17 MED ORDER — LACTATED RINGERS IV SOLN: 1000.0000 mL | INTRAVENOUS | Status: DC

## 2016-01-17 MED ORDER — CEFAZOLIN SODIUM 1 G IJ SOLR
INTRAMUSCULAR | Status: AC
  Administered 2016-01-17: 1 g via INTRAVENOUS
  Filled 2016-01-17: qty 10

## 2016-01-17 MED ORDER — MIDAZOLAM HCL 5 MG/5ML IJ SOLN: 5.0000 mg | Freq: Once | INTRAMUSCULAR | Status: DC

## 2016-01-17 MED ORDER — BUPIVACAINE HCL (PF) 0.25 % IJ SOLN: 30.0000 mL | Freq: Once | INTRAMUSCULAR | Status: DC

## 2016-01-17 MED ORDER — BUPIVACAINE HCL (PF) 0.25 % IJ SOLN
INTRAMUSCULAR | Status: AC
  Administered 2016-01-17: 14:00:00
  Filled 2016-01-17: qty 30

## 2016-01-17 MED ORDER — LIDOCAINE HCL (PF) 1 % IJ SOLN: 10.0000 mL | Freq: Once | INTRAMUSCULAR | Status: DC

## 2016-01-17 MED ORDER — CEFAZOLIN SODIUM 1-5 GM-% IV SOLN: 1.0000 g | Freq: Once | INTRAVENOUS | Status: DC

## 2016-01-17 MED ORDER — ORPHENADRINE CITRATE 30 MG/ML IJ SOLN: 60.0000 mg | Freq: Once | INTRAMUSCULAR | Status: DC

## 2016-01-17 MED ORDER — TRIAMCINOLONE ACETONIDE 40 MG/ML IJ SUSP
INTRAMUSCULAR | Status: AC
  Administered 2016-01-17: 14:00:00
  Filled 2016-01-17: qty 1

## 2016-01-17 MED ORDER — MIDAZOLAM HCL 5 MG/5ML IJ SOLN
INTRAMUSCULAR | Status: AC
  Administered 2016-01-17: 4 mg via INTRAVENOUS
  Filled 2016-01-17: qty 5

## 2016-01-17 MED ORDER — ORPHENADRINE CITRATE 30 MG/ML IJ SOLN
INTRAMUSCULAR | Status: AC
  Filled 2016-01-17: qty 2

## 2016-01-17 MED ORDER — FENTANYL CITRATE (PF) 100 MCG/2ML IJ SOLN
INTRAMUSCULAR | Status: AC
  Administered 2016-01-17: 50 ug via INTRAVENOUS
  Filled 2016-01-17: qty 2

## 2016-01-17 MED ORDER — FENTANYL CITRATE (PF) 100 MCG/2ML IJ SOLN: 100.0000 ug | Freq: Once | INTRAMUSCULAR | Status: DC

## 2016-01-17 NOTE — Patient Instructions (Addendum)
PLAN    Continue present medication Please obtain Ceftin antibiotic today and begin taking Ceftin antibiotic today as prescribed   F/U PCP Dr.Megan Wynetta Emery  for evaliation of  BP and general medical  condition. As we discussed today please see primary care physician  Park Liter Thursday or Friday to consider prescribing medication to prevent any swelling of the lower extremities due to the steroid injection that you received today   F/U surgical evaluation. Follow-up with Dr. Arnoldo Morale as discussed  F/U neurological evaluation. May consider pending follow-up evaluations  May consider radiofrequency rhizolysis or intraspinal procedures pending response to present treatment and F/U evaluation   Patient to call Pain Management Center should patient have concerns prior to scheduled return appointment.Pain Management Discharge Instructions  General Discharge Instructions :  If you need to reach your doctor call: Monday-Friday 8:00 am - 4:00 pm at [phone] or toll free [phone].  After clinic hours [phone] to have operator reach doctor.  Bring all of your medication bottles to all your appointments in the pain clinic.  To cancel or reschedule your appointment with Pain Management please remember to call 24 hours in advance to avoid a fee.  Refer to the educational materials which you have been given on: General Risks, I had my Procedure. Discharge Instructions, Post Sedation.  Post Procedure Instructions:  The drugs you were given will stay in your system until tomorrow, so for the next 24 hours you should not drive, make any legal decisions or drink any alcoholic beverages.  You may eat anything you prefer, but it is better to start with liquids then soups and crackers, and gradually work up to solid foods.  Please notify your doctor immediately if you have any unusual bleeding, trouble breathing or pain that is not related to your normal pain.  Depending on the type of  procedure that was done, some parts of your body may feel week and/or numb.  This usually clears up by tonight or the next day.  Walk with the use of an assistive device or accompanied by an adult for the 24 hours.  You may use ice on the affected area for the first 24 hours.  Put ice in a Ziploc bag and cover with a towel and place against area 15 minutes on 15 minutes off.  You may switch to heat after 24 hours.

## 2016-01-17 NOTE — Progress Notes (Signed)
Subjective:    Patient ID: Karen Farley, female    DOB: 12-06-1945, 70 y.o.   MRN: [id number]  HPI  PROCEDURE PERFORMED: Lumbar facet (medial branch block)   NOTE: The patient is a 70 y.o. female who returns to [facility] for further evaluation and treatment of pain involving the lumbar and lower extremity region. MRI  revealed the patient to be with evidence of degenerative disc disease as described above. Multilevel facet  arthropathy is present and appears worst at L4-5 and L5-S1 . The patient's pain is felt to be due to a significant component of lumbar facet syndrome. The risks, benefits, and expectations of the procedure have been discussed and explained to the patient who was understanding and in agreement with suggested treatment plan. We will proceed with interventional treatment as discussed and as explained to the patient who was understanding and wished to proceed with procedure as planned.   DESCRIPTION OF PROCEDURE: Lumbar facet (medial branch block) with IV Versed, IV fentanyl conscious sedation, EKG, blood pressure, pulse, and pulse oximetry monitoring. The procedure was performed with the patient in the prone position. Betadine prep of proposed entry site performed.   NEEDLE PLACEMENT AT: Left L 2 lumbar facet (medial branch block). Under fluoroscopic guidance with oblique orientation of 15 degrees, a 22-gauge needle was inserted at the L 2 vertebral body level with needle placed at the targeted area of Burton's Eye or Eye of the Scotty Dog with documentation of needle placement in the superior and lateral border of targeted area of Burton's Eye or Eye of the Scotty Dog with oblique orientation of 15 degrees. Following documentation of needle placement at the L 2 vertebral body level, needle placement was then accomplished at the L 3 vertebral body level.   NEEDLE PLACEMENT AT L3, L4, and L5 VERTEBRAL BODY LEVELS ON THE LEFT SIDE The procedure was performed at  the L3, L4, and L5 vertebral body levels exactly as was performed at the L 2 vertebral body level utilizing the same technique and under fluoroscopic guidance.  NEEDLE PLACEMENT AT THE SACRAL ALA with AP view of the lumbosacral spine. With the patient in the prone position, Betadine prep of proposed entry site accomplished, a 22 gauge needle was inserted in the region of the sacral ala (groove formed by the superior articulating process of S1 and the sacral wing). Following documentation of needle placement at the sacral ala,   Needle placement was then verified at all levels on lateral view. Following documentation of needle placement at all levels on lateral view and following negative aspiration for heme and CSF, each level was injected with 1 mL of 0.25% bupivacaine with Kenalog.     LUMBAR FACET, MEDIAL BRANCH NERVE, BLOCKS PERFORMED ON THE RIGHT SIDE   The procedure was performed on the right side exactly as was performed on the left side at the same levels and utilizing the same technique under fluoroscopic guidance.     The patient tolerated the procedure well. A total of 40 mg of Kenalog was utilized for the procedure.   PLAN:  1. Medications: The patient will continue presently prescribed medications. 2. May consider modification of treatment regimen at time of return appointment pending response to treatment rendered on today's visit. 3. The patient is to follow-up with primary care physician  Karen Farley for further evaluation of blood pressure lower extremity swelling and general medical condition status post steroid injection performed on today's visit. Appointment was arranged for patient  to see primary care physician this week for evaluation of lower extremity swelling and pressure and general medical condition 4. Surgical follow-up evaluation.Marland Kitchen Has been addressed 5. Neurological follow-up evaluation.. May consider PNCV/EMG studies and other studies 6. The patient may be  candidate for radiofrequency procedures, implantation type procedures, and other treatment pending response to treatment and follow-up evaluation. 7. The patient has been advised to call the Pain Management Center prior to scheduled return appointment should there be significant change in condition or should patient have other concerns regarding condition prior to scheduled return appointment.  The patient is understanding and in agreement with suggested treatment plan.   Review of Systems     Objective:   Physical Exam        Assessment & Plan:

## 2016-01-17 NOTE — Progress Notes (Signed)
Safety precautions to be maintained throughout the outpatient stay will include: orient to surroundings, keep bed in low position, maintain call bell within reach at all times, provide assistance with transfer out of bed and ambulation.  Pt has lower edema in ankles/feet- MD would like for pt to see MD this week

## 2016-01-18 ENCOUNTER — Telehealth: Payer: Self-pay | Admitting: *Deleted

## 2016-01-18 ENCOUNTER — Other Ambulatory Visit: Payer: Self-pay | Admitting: Pain Medicine

## 2016-01-18 NOTE — Telephone Encounter (Signed)
Attempted to reach patient, no answer, no voicemail capability

## 2016-01-19 ENCOUNTER — Encounter: Payer: Self-pay | Admitting: Family Medicine

## 2016-01-19 ENCOUNTER — Ambulatory Visit (INDEPENDENT_AMBULATORY_CARE_PROVIDER_SITE_OTHER): Payer: Medicare Other | Admitting: Family Medicine

## 2016-01-19 ENCOUNTER — Ambulatory Visit: Payer: Medicare Other | Admitting: Family Medicine

## 2016-01-19 VITALS — BP 150/85 | HR 76 | Temp 98.9°F | Ht 61.0 in | Wt 239.0 lb

## 2016-01-19 DIAGNOSIS — I10 Essential (primary) hypertension: Secondary | ICD-10-CM

## 2016-01-19 DIAGNOSIS — R609 Edema, unspecified: Secondary | ICD-10-CM

## 2016-01-19 NOTE — Progress Notes (Signed)
BP 150/85 mmHg  Pulse 76  Temp(Src) 98.9 F (37.2 C)  Ht 5\' 1"  (1.549 m)  Wt 239 lb (108.41 kg)  BMI 45.18 kg/m2  SpO2 95%   Subjective:    Patient ID: Karen Farley, female    DOB: 1946-01-18, 70 y.o.   MRN: [id number]  HPI: Karen Farley is a 70 y.o. female  Chief Complaint  Patient presents with  . Leg Swelling    Patient states that she believes that the swelling is from eating a lot of Olives and dill pickles, because her husbands started swelling the same day. She states that they are better today then they have been   Has been having some hot flashes from her steroid injection.  Was eating a large amount of olives and pickles over the weekend. Legs had been really swollen with the food that she was eating. No shortness of breath. Otherwise feeling better. Wore her compression stockings yesterday and had been feeling better with that, but her pain doctor wanted her to be seen this week. Legs significantly less swollen. No SOB, No CP, No redness, otherwise doing OK. Blood sugar elevated with her procedure.  Not sure yet how her back is doing as she just had the injection on Wednesday.   Relevant past medical, surgical, family and social history reviewed and updated as indicated. Interim medical history since our last visit reviewed. Allergies and medications reviewed and updated.  Review of Systems  Constitutional: Negative.   Respiratory: Negative.   Cardiovascular: Positive for leg swelling. Negative for chest pain and palpitations.  Musculoskeletal: Negative.   Psychiatric/Behavioral: Negative.     Per HPI unless specifically indicated above     Objective:    BP 150/85 mmHg  Pulse 76  Temp(Src) 98.9 F (37.2 C)  Ht 5\' 1"  (1.549 m)  Wt 239 lb (108.41 kg)  BMI 45.18 kg/m2  SpO2 95%  Wt Readings from Last 3 Encounters:  01/19/16 239 lb (108.41 kg)  01/17/16 236 lb (107.049 kg)  01/09/16 236 lb (107.049 kg)    Physical Exam  Constitutional: She is  oriented to person, place, and time. She appears well-developed and well-nourished. No distress.  HENT:  Head: Normocephalic and atraumatic.  Right Ear: Hearing normal.  Left Ear: Hearing normal.  Nose: Nose normal.  Eyes: Conjunctivae and lids are normal. Right eye exhibits no discharge. Left eye exhibits no discharge. No scleral icterus.  Cardiovascular: Normal rate, regular rhythm, normal heart sounds and intact distal pulses.  Exam reveals no gallop and no friction rub.   No murmur heard. Pulmonary/Chest: Effort normal and breath sounds normal. No respiratory distress. She has no wheezes. She has no rales. She exhibits no tenderness.  Musculoskeletal: Normal range of motion. She exhibits edema (trace edema bilaterally). She exhibits no tenderness.  Neurological: She is alert and oriented to person, place, and time.  Skin: Skin is warm, dry and intact. No rash noted. No erythema. No pallor.  Psychiatric: She has a normal mood and affect. Her speech is normal and behavior is normal. Judgment and thought content normal. Cognition and memory are normal.  Nursing note and vitals reviewed.   Results for orders placed or performed in visit on 12/15/15  Thyroid Panel With TSH  Result Value Ref Range   TSH 2.160 0.450 - 4.500 uIU/mL   T4, Total 9.4 4.5 - 12.0 ug/dL   T3 Uptake Ratio 25 24 - 39 %   Free Thyroxine Index 2.4 1.2 - 4.9  Assessment & Plan:   Problem List Items Addressed This Visit      Cardiovascular and Mediastinum   Hypertension - Primary    Better on recheck, likely due to steroid. Continue current regimen, call with concerns.         Other   Edema    Improved. Likely due to increased salt intake. Monitor. Compression stockings. Better diet. Elevation. Call if not better on Monday.           Follow up plan: Return As scheduled.

## 2016-01-19 NOTE — Assessment & Plan Note (Signed)
Improved. Likely due to increased salt intake. Monitor. Compression stockings. Better diet. Elevation. Call if not better on Monday.

## 2016-01-19 NOTE — Assessment & Plan Note (Signed)
Better on recheck, likely due to steroid. Continue current regimen, call with concerns.

## 2016-02-07 ENCOUNTER — Ambulatory Visit: Payer: Medicare Other | Attending: Pain Medicine | Admitting: Pain Medicine

## 2016-02-07 ENCOUNTER — Encounter: Payer: Self-pay | Admitting: Pain Medicine

## 2016-02-07 VITALS — BP 154/73 | HR 102 | Temp 97.5°F | Resp 16 | Ht 61.0 in | Wt 236.0 lb

## 2016-02-07 DIAGNOSIS — M5136 Other intervertebral disc degeneration, lumbar region: Secondary | ICD-10-CM | POA: Diagnosis not present

## 2016-02-07 DIAGNOSIS — M5124 Other intervertebral disc displacement, thoracic region: Secondary | ICD-10-CM | POA: Insufficient documentation

## 2016-02-07 DIAGNOSIS — M545 Low back pain: Secondary | ICD-10-CM | POA: Diagnosis present

## 2016-02-07 DIAGNOSIS — M19041 Primary osteoarthritis, right hand: Secondary | ICD-10-CM | POA: Diagnosis not present

## 2016-02-07 DIAGNOSIS — M79606 Pain in leg, unspecified: Secondary | ICD-10-CM | POA: Diagnosis present

## 2016-02-07 DIAGNOSIS — M19042 Primary osteoarthritis, left hand: Secondary | ICD-10-CM | POA: Diagnosis not present

## 2016-02-07 DIAGNOSIS — M533 Sacrococcygeal disorders, not elsewhere classified: Secondary | ICD-10-CM | POA: Insufficient documentation

## 2016-02-07 DIAGNOSIS — M170 Bilateral primary osteoarthritis of knee: Secondary | ICD-10-CM | POA: Diagnosis not present

## 2016-02-07 DIAGNOSIS — M47816 Spondylosis without myelopathy or radiculopathy, lumbar region: Secondary | ICD-10-CM

## 2016-02-07 DIAGNOSIS — E119 Type 2 diabetes mellitus without complications: Secondary | ICD-10-CM | POA: Diagnosis not present

## 2016-02-07 DIAGNOSIS — M4316 Spondylolisthesis, lumbar region: Secondary | ICD-10-CM | POA: Insufficient documentation

## 2016-02-07 NOTE — Progress Notes (Signed)
Subjective:    Patient ID: Karen Farley, female    DOB: [birth year], 70 y.o.   MRN: [id number]  HPI  The patient is a 70 year old female who returns to pain management for further evaluation and treatment of pain involving the lower back and lower extremity region. The patient is status post lumbar facet, medial branch nerve blocks. The patient MRI is with evidence of significant facet arthropathy. We will proceed with lumbar epidural steroid injection to be performed at time return appointment. The patient states that her pain increases with standing walking and that she has to bend forward to relieve some of the pain and pressure. There appears to be component of patient's pain began due to lumbar stenosis with neurogenic claudication. We will proceed with lumbar epidural steroid injection and pending response to treatment and will consider patient for additional modifications of treatment regimen. The patient is undergone surgical evaluation with Dr. Arnoldo Morale and is without plans for surgical intervention at this time. We discussed patient's condition and pending response to treatment the patient may be a candidate for radiofrequency rhizolysis lumbar facet, medial branch nerves. The patient prefers to avoid receiving medications at this time for treatment of her pain. We will we'll proceed with lumbar epidural steroid injection at time return appointment in attempt to decrease severity of patient's symptoms, minimize progression of patient's symptoms, and avoid the need for more involved treatment. All agreed to suggested treatment plan      Review of Systems     Objective:   Physical Exam   There was tenderness of the splenius capitis and occipitalis region of mild to moderate degree with moderate tenderness over the cervical facet cervical paraspinal musculature region. Palpation over the region of the thoracic facet thoracic paraspinal musculature region was with mild to moderate  discomfort. Palpation over the thoracic facet thoracic paraspinal musculature region with no crepitus of the thoracic region noted. There was evidence of no increased pain with Tinel and Phalen's maneuver and patient appeared to be with bilaterally equal grip strength. Palpation over the region of the lumbar paraspinal must reason lumbar facet region was associated with significant pain with extension and palpation of the lumbar facets reproducing severe discomfort. Patient stated with forward flexion positioning. The patient assumed disposition to avoid increased pain with extension caused by lumbar facet arthropathy. There was tenderness over the PSIS and PII S region a moderate degree with mild tenderness of the greater trochanteric region iliotibial band region. Straight leg raise was tolerates approximately 30 without an increase of pain with dorsiflexion noted. Patrick's maneuver was without increased pain of significant degree. There was tenderness of the gluteal and piriformis musculature region a moderate degree. There was negative clonus negative Homans. No sensory deficit of dermatomal distribution was detected. There was negative clonus negative Homans. Abdomen was nontender and no costovertebral tenderness noted     Assessment & Plan:     Degenerative disc disease lumbar spine      EXAM:  MRI LUMBAR SPINE WITHOUT CONTRAST   TECHNIQUE:  Multiplanar, multisequence MR imaging of the lumbar spine was  performed. No intravenous contrast was administered.   COMPARISON: None.   FINDINGS:  Vertebral body height is maintained. Trace anterolisthesis L4 on L5  due to facet arthropathy is noted. 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 The conus medullaris is  normal in signal and position. Imaged intra-abdominal contents are  unremarkable.   The T10-11 and T11-12 levels  are imaged in the sagittal plane only  and negative.    T12-L1: Tiny central protrusion without central canal or foraminal  narrowing is identified.   L1-2: Negative.   L2-3: Shallow disc bulge without central canal or foraminal  narrowing.   L3-4: Moderate facet arthropathy is identified. No disc bulge or  protrusion. The central canal and foramina are widely patent.   L4-5: Advanced bilateral facet degenerative change is present. The  disc is slightly uncovered but there is no bulge or protrusion. The  central canal and foramina are widely patent.   L5-S1: Bilateral facet arthropathy is present. Minimal disc bulge  and endplate spur without central canal or foraminal stenosis.   IMPRESSION:  Negative for central canal or foraminal narrowing with mild  degenerative disc disease as described above. Multilevel facet  arthropathy is present and appears worst at L4-5 and L5-S1   Lumbar facet syndrome  Sacroiliac joint dysfunction   Osteoarthritis of knees and fingers   Diabetes mellitus         PLAN    Continue present medication  Lumbar epidural steroid injection to be performed at time of return appointment  F/U PCP Dr.Megan Wynetta Emery  for evaliation of  BP and general medical  condition  F/U surgical evaluation. Follow-up with Dr. Arnoldo Morale as discussed  F/U neurological evaluation. May consider pending follow-up evaluations  May consider radiofrequency rhizolysis or intraspinal procedures pending response to present treatment and F/U evaluation . The patient may be a candidate for radiofrequency rhizolysis lumbar facet, medial branch nerves as discussed  Patient to call [facility] should patient have concerns prior to scheduled return appointment.

## 2016-02-07 NOTE — Patient Instructions (Addendum)
PLAN    Continue present medication  Lumbar epidural steroid injection to be performed at time of return appointment  F/U PCP Dr.Megan Wynetta Emery  for evaliation of  BP and general medical  condition  F/U surgical evaluation. Follow-up with Dr. Arnoldo Morale as discussed  F/U neurological evaluation. May consider pending follow-up evaluations  May consider radiofrequency rhizolysis or intraspinal procedures pending response to present treatment and F/U evaluation   Patient to call [facility] should patient have concerns prior to scheduled return appointment.GENERAL RISKS AND COMPLICATIONS  What are the risk, side effects and possible complications? Generally speaking, most procedures are safe.  However, with any procedure there are risks, side effects, and the possibility of complications.  The risks and complications are dependent upon the sites that are lesioned, or the type of nerve block to be performed.  The closer the procedure is to the spine, the more serious the risks are.  Great care is taken when placing the radio frequency needles, block needles or lesioning probes, but sometimes complications can occur. 1. Infection: Any time there is an injection through the skin, there is a risk of infection.  This is why sterile conditions are used for these blocks.  There are four possible types of infection. 1. Localized skin infection. 2. Central Nervous System Infection-This can be in the form of Meningitis, which can be deadly. 3. Epidural Infections-This can be in the form of an epidural abscess, which can cause pressure inside of the spine, causing compression of the spinal cord with subsequent paralysis. This would require an emergency surgery to decompress, and there are no guarantees that the patient would recover from the paralysis. 4. Discitis-This is an infection of the intervertebral discs.  It occurs in about 1% of discography procedures.  It is difficult to treat and it may  lead to surgery.        2. Pain: the needles have to go through skin and soft tissues, will cause soreness.       3. Damage to internal structures:  The nerves to be lesioned may be near blood vessels or    other nerves which can be potentially damaged.       4. Bleeding: Bleeding is more common if the patient is taking blood thinners such as  aspirin, Coumadin, Ticiid, Plavix, etc., or if he/she have some genetic predisposition  such as hemophilia. Bleeding into the spinal canal can cause compression of the spinal  cord with subsequent paralysis.  This would require an emergency surgery to  decompress and there are no guarantees that the patient would recover from the  paralysis.       5. Pneumothorax:  Puncturing of a lung is a possibility, every time a needle is introduced in  the area of the chest or upper back.  Pneumothorax refers to free air around the  collapsed lung(s), inside of the thoracic cavity (chest cavity).  Another two possible  complications related to a similar event would include: Hemothorax and Chylothorax.   These are variations of the Pneumothorax, where instead of air around the collapsed  lung(s), you may have blood or chyle, respectively.       6. Spinal headaches: They may occur with any procedures in the area of the spine.       7. Persistent CSF (Cerebro-Spinal Fluid) leakage: This is a rare problem, but may occur  with prolonged intrathecal or epidural catheters either due to the formation of a fistulous  track or a  dural tear.       8. Nerve damage: By working so close to the spinal cord, there is always a possibility of  nerve damage, which could be as serious as a permanent spinal cord injury with  paralysis.       9. Death:  Although rare, severe deadly allergic reactions known as "Anaphylactic  reaction" can occur to any of the medications used.      10. Worsening of the symptoms:  We can always make thing worse.  What are the chances of something like this  happening? Chances of any of this occuring are extremely low.  By statistics, you have more of a chance of getting killed in a motor vehicle accident: while driving to the hospital than any of the above occurring .  Nevertheless, you should be aware that they are possibilities.  In general, it is similar to taking a shower.  Everybody knows that you can slip, hit your head and get killed.  Does that mean that you should not shower again?  Nevertheless always keep in mind that statistics do not mean anything if you happen to be on the wrong side of them.  Even if a procedure has a 1 (one) in a 1,000,000 (million) chance of going wrong, it you happen to be that one..Also, keep in mind that by statistics, you have more of a chance of having something go wrong when taking medications.  Who should not have this procedure? If you are on a blood thinning medication (e.g. Coumadin, Plavix, see list of "Blood Thinners"), or if you have an active infection going on, you should not have the procedure.  If you are taking any blood thinners, please inform your physician.  How should I prepare for this procedure?  Do not eat or drink anything at least six hours prior to the procedure.  Bring a driver with you .  It cannot be a taxi.  Come accompanied by an adult that can drive you back, and that is strong enough to help you if your legs get weak or numb from the local anesthetic.  Take all of your medicines the morning of the procedure with just enough water to swallow them.  If you have diabetes, make sure that you are scheduled to have your procedure done first thing in the morning, whenever possible.  If you have diabetes, take only half of your insulin dose and notify our nurse that you have done so as soon as you arrive at the clinic.  If you are diabetic, but only take blood sugar pills (oral hypoglycemic), then do not take them on the morning of your procedure.  You may take them after you have had the  procedure.  Do not take aspirin or any aspirin-containing medications, at least eleven (11) days prior to the procedure.  They may prolong bleeding.  Wear loose fitting clothing that may be easy to take off and that you would not mind if it got stained with Betadine or blood.  Do not wear any jewelry or perfume  Remove any nail coloring.  It will interfere with some of our monitoring equipment.  NOTE: Remember that this is not meant to be interpreted as a complete list of all possible complications.  Unforeseen problems may occur.  BLOOD THINNERS The following drugs contain aspirin or other products, which can cause increased bleeding during surgery and should not be taken for 2 weeks prior to and 1 week after surgery.  If you  should need take something for relief of minor pain, you may take acetaminophen which is found in Tylenol,m Datril, Anacin-3 and Panadol. It is not blood thinner. The products listed below are.  Do not take any of the products listed below in addition to any listed on your instruction sheet.  A.P.C or A.P.C with Codeine Codeine Phosphate Capsules #3 Ibuprofen Ridaura  ABC compound Congesprin Imuran rimadil  Advil Cope Indocin Robaxisal  Alka-Seltzer Effervescent Pain Reliever and Antacid Coricidin or Coricidin-D  Indomethacin Rufen  Alka-Seltzer plus Cold Medicine Cosprin Ketoprofen S-A-C Tablets  Anacin Analgesic Tablets or Capsules Coumadin Korlgesic Salflex  Anacin Extra Strength Analgesic tablets or capsules CP-2 Tablets Lanoril Salicylate  Anaprox Cuprimine Capsules Levenox Salocol  Anexsia-D Dalteparin Magan Salsalate  Anodynos Darvon compound Magnesium Salicylate Sine-off  Ansaid Dasin Capsules Magsal Sodium Salicylate  Anturane Depen Capsules Marnal Soma  APF Arthritis pain formula Dewitt's Pills Measurin Stanback  Argesic Dia-Gesic Meclofenamic Sulfinpyrazone  Arthritis Bayer Timed Release Aspirin Diclofenac Meclomen Sulindac  Arthritis pain formula  Anacin Dicumarol Medipren Supac  Analgesic (Safety coated) Arthralgen Diffunasal Mefanamic Suprofen  Arthritis Strength Bufferin Dihydrocodeine Mepro Compound Suprol  Arthropan liquid Dopirydamole Methcarbomol with Aspirin Synalgos  ASA tablets/Enseals Disalcid Micrainin Tagament  Ascriptin Doan's Midol Talwin  Ascriptin A/D Dolene Mobidin Tanderil  Ascriptin Extra Strength Dolobid Moblgesic Ticlid  Ascriptin with Codeine Doloprin or Doloprin with Codeine Momentum Tolectin  Asperbuf Duoprin Mono-gesic Trendar  Aspergum Duradyne Motrin or Motrin IB Triminicin  Aspirin plain, buffered or enteric coated Durasal Myochrisine Trigesic  Aspirin Suppositories Easprin Nalfon Trillsate  Aspirin with Codeine Ecotrin Regular or Extra Strength Naprosyn Uracel  Atromid-S Efficin Naproxen Ursinus  Auranofin Capsules Elmiron Neocylate Vanquish  Axotal Emagrin Norgesic Verin  Azathioprine Empirin or Empirin with Codeine Normiflo Vitamin E  Azolid Emprazil Nuprin Voltaren  Bayer Aspirin plain, buffered or children's or timed BC Tablets or powders Encaprin Orgaran Warfarin Sodium  Buff-a-Comp Enoxaparin Orudis Zorpin  Buff-a-Comp with Codeine Equegesic Os-Cal-Gesic   Buffaprin Excedrin plain, buffered or Extra Strength Oxalid   Bufferin Arthritis Strength Feldene Oxphenbutazone   Bufferin plain or Extra Strength Feldene Capsules Oxycodone with Aspirin   Bufferin with Codeine Fenoprofen Fenoprofen Pabalate or Pabalate-SF   Buffets II Flogesic Panagesic   Buffinol plain or Extra Strength Florinal or Florinal with Codeine Panwarfarin   Buf-Tabs Flurbiprofen Penicillamine   Butalbital Compound Four-way cold tablets Penicillin   Butazolidin Fragmin Pepto-Bismol   Carbenicillin Geminisyn Percodan   Carna Arthritis Reliever Geopen Persantine   Carprofen Gold's salt Persistin   Chloramphenicol Goody's Phenylbutazone   Chloromycetin Haltrain Piroxlcam   Clmetidine heparin Plaquenil   Cllnoril Hyco-pap  Ponstel   Clofibrate Hydroxy chloroquine Propoxyphen         Before stopping any of these medications, be sure to consult the physician who ordered them.  Some, such as Coumadin (Warfarin) are ordered to prevent or treat serious conditions such as "deep thrombosis", "pumonary embolisms", and other heart problems.  The amount of time that you may need off of the medication may also vary with the medication and the reason for which you were taking it.  If you are taking any of these medications, please make sure you notify your pain physician before you undergo any procedures.         Epidural Steroid Injection An epidural steroid injection is given to relieve pain in your neck, back, or legs that is caused by the irritation or swelling of a nerve root. This procedure involves injecting a steroid  and numbing medicine (anesthetic) into the epidural space. The epidural space is the space between the outer covering of your spinal cord and the bones that form your backbone (vertebra).  LET [facility] CARE PROVIDER KNOW ABOUT:  2. Any allergies you have. 3. All medicines you are taking, including vitamins, herbs, eye drops, creams, and over-the-counter medicines such as aspirin. 4. Previous problems you or members of your family have had with the use of anesthetics. 5. Any blood disorders or blood clotting disorders you have. 6. Previous surgeries you have had. 7. Medical conditions you have. RISKS AND COMPLICATIONS Generally, this is a safe procedure. However, as with any procedure, complications can occur. Possible complications of epidural steroid injection include:  Headache.  Bleeding.  Infection.  Allergic reaction to the medicines.  Damage to your nerves. The response to this procedure depends on the underlying cause of the pain and its duration. People who have long-term (chronic) pain are less likely to benefit from epidural steroids than are those people whose pain comes on  strong and suddenly. BEFORE THE PROCEDURE   Ask your health care provider about changing or stopping your regular medicines. You may be advised to stop taking blood-thinning medicines a few days before the procedure.  You may be given medicines to reduce anxiety.  Arrange for someone to take you home after the procedure. PROCEDURE   You will remain awake during the procedure. You may receive medicine to make you relaxed.  You will be asked to lie on your stomach.  The injection site will be cleaned.  The injection site will be numbed with a medicine (local anesthetic).  A needle will be injected through your skin into the epidural space.  Your health care provider will use an X-ray machine to ensure that the steroid is delivered closest to the affected nerve. You may have minimal discomfort at this time.  Once the needle is in the right position, the local anesthetic and the steroid will be injected into the epidural space.  The needle will then be removed and a bandage will be applied to the injection site. AFTER THE PROCEDURE  12. You may be monitored for a short time before you go home. 13. You may feel weakness or numbness in your arm or leg, which disappears within hours. 14. You may be allowed to eat, drink, and take your regular medicine. 15. You may have soreness at the site of the injection.   This information is not intended to replace advice given to you by your health care provider. Make sure you discuss any questions you have with your health care provider.   Document Released: 01/14/2008 Document Revised: 06/09/2013 Document Reviewed: 03/26/2013 Elsevier Interactive Patient Education [employer].

## 2016-02-07 NOTE — Progress Notes (Signed)
Safety precautions to be maintained throughout the outpatient stay will include: orient to surroundings, keep bed in low position, maintain call bell within reach at all times, provide assistance with transfer out of bed and ambulation.  

## 2016-02-09 ENCOUNTER — Ambulatory Visit (INDEPENDENT_AMBULATORY_CARE_PROVIDER_SITE_OTHER): Payer: Medicare Other | Admitting: Family Medicine

## 2016-02-09 ENCOUNTER — Encounter: Payer: Self-pay | Admitting: Family Medicine

## 2016-02-09 VITALS — BP 117/62 | HR 90 | Temp 98.0°F | Ht 61.0 in | Wt 231.0 lb

## 2016-02-09 DIAGNOSIS — E119 Type 2 diabetes mellitus without complications: Secondary | ICD-10-CM

## 2016-02-09 LAB — BAYER DCA HB A1C WAIVED: HB A1C (BAYER DCA - WAIVED): 7 % — ABNORMAL HIGH (ref <7.0)

## 2016-02-09 MED ORDER — METFORMIN HCL ER (OSM) 1000 MG PO TB24: 1000.0000 mg | ORAL_TABLET | Freq: Two times a day (BID) | ORAL | Status: DC

## 2016-02-09 NOTE — Progress Notes (Signed)
BP 117/62 mmHg  Pulse 90  Temp(Src) 98 F (36.7 C)  Ht 5\' 1"  (1.549 m)  Wt 231 lb (104.781 kg)  BMI 43.67 kg/m2  SpO2 94%   Subjective:    Patient ID: Karen Farley, female    DOB: [birth year], 70 y.o.   MRN: [id number]  HPI: Karen Farley is a 70 y.o. female  Chief Complaint  Patient presents with  . Diabetes  . Hypertension   DIABETES Hypoglycemic episodes:no Polydipsia/polyuria: no Visual disturbance: no Chest pain: no Paresthesias: no Glucose Monitoring: yes  Accucheck frequency: Daily Taking Insulin?: no Blood Pressure Monitoring: not checking Retinal Examination: Not up to Date Foot Exam: Not up to Date Diabetic Education: Completed Pneumovax: Up to Date Influenza: Up to Date Aspirin: yes  Relevant past medical, surgical, family and social history reviewed and updated as indicated. Interim medical history since our last visit reviewed. Allergies and medications reviewed and updated.  Review of Systems  Constitutional: Negative.   Respiratory: Negative.   Cardiovascular: Negative.   Psychiatric/Behavioral: Negative.     Per HPI unless specifically indicated above     Objective:    BP 117/62 mmHg  Pulse 90  Temp(Src) 98 F (36.7 C)  Ht 5\' 1"  (1.549 m)  Wt 231 lb (104.781 kg)  BMI 43.67 kg/m2  SpO2 94%  Wt Readings from Last 3 Encounters:  02/09/16 231 lb (104.781 kg)  02/07/16 236 lb (107.049 kg)  01/19/16 239 lb (108.41 kg)    Physical Exam  Constitutional: She is oriented to person, place, and time. She appears well-developed and well-nourished. No distress.  HENT:  Head: Normocephalic and atraumatic.  Right Ear: Hearing normal.  Left Ear: Hearing normal.  Nose: Nose normal.  Eyes: Conjunctivae and lids are normal. Right eye exhibits no discharge. Left eye exhibits no discharge. No scleral icterus.  Cardiovascular: Normal rate, regular rhythm, normal heart sounds and intact distal pulses.  Exam reveals no gallop and no friction  rub.   No murmur heard. Pulmonary/Chest: Effort normal and breath sounds normal. No respiratory distress. She has no wheezes. She has no rales. She exhibits no tenderness.  Musculoskeletal: Normal range of motion.  Neurological: She is alert and oriented to person, place, and time.  Skin: Skin is warm, dry and intact. No rash noted. No erythema. No pallor.  Psychiatric: She has a normal mood and affect. Her speech is normal and behavior is normal. Judgment and thought content normal. Cognition and memory are normal.  Nursing note and vitals reviewed.   Results for orders placed or performed in visit on 12/15/15  Thyroid Panel With TSH  Result Value Ref Range   TSH 2.160 0.450 - 4.500 uIU/mL   T4, Total 9.4 4.5 - 12.0 ug/dL   T3 Uptake Ratio 25 24 - 39 %   Free Thyroxine Index 2.4 1.2 - 4.9      Assessment & Plan:   Problem List Items Addressed This Visit      Endocrine   Diabetes mellitus without complication ([place]) - Primary    A1c down to 7.0 even with recent steroid injection. Continue current regimen. Recheck 3 months.      Relevant Medications   metformin (FORTAMET) 1000 MG (OSM) 24 hr tablet   Other Relevant Orders   Bayer DCA Hb A1c Waived       Follow up plan: Return in about 3 months (around 05/10/2016).

## 2016-02-09 NOTE — Assessment & Plan Note (Signed)
A1c down to 7.0 even with recent steroid injection. Continue current regimen. Recheck 3 months.

## 2016-02-21 ENCOUNTER — Ambulatory Visit: Payer: Medicare Other | Attending: Pain Medicine | Admitting: Pain Medicine

## 2016-02-21 ENCOUNTER — Encounter: Payer: Self-pay | Admitting: Pain Medicine

## 2016-02-21 VITALS — BP 114/68 | HR 80 | Temp 97.5°F | Resp 16 | Ht 61.0 in | Wt 231.0 lb

## 2016-02-21 DIAGNOSIS — M4316 Spondylolisthesis, lumbar region: Secondary | ICD-10-CM | POA: Insufficient documentation

## 2016-02-21 DIAGNOSIS — M47816 Spondylosis without myelopathy or radiculopathy, lumbar region: Secondary | ICD-10-CM

## 2016-02-21 DIAGNOSIS — M5136 Other intervertebral disc degeneration, lumbar region: Secondary | ICD-10-CM | POA: Diagnosis not present

## 2016-02-21 DIAGNOSIS — M5126 Other intervertebral disc displacement, lumbar region: Secondary | ICD-10-CM | POA: Diagnosis not present

## 2016-02-21 DIAGNOSIS — M1288 Other specific arthropathies, not elsewhere classified, other specified site: Secondary | ICD-10-CM | POA: Diagnosis not present

## 2016-02-21 DIAGNOSIS — M533 Sacrococcygeal disorders, not elsewhere classified: Secondary | ICD-10-CM

## 2016-02-21 DIAGNOSIS — M79606 Pain in leg, unspecified: Secondary | ICD-10-CM | POA: Diagnosis present

## 2016-02-21 DIAGNOSIS — M545 Low back pain: Secondary | ICD-10-CM | POA: Diagnosis present

## 2016-02-21 MED ORDER — MIDAZOLAM HCL 5 MG/5ML IJ SOLN: 5.0000 mg | Freq: Once | INTRAMUSCULAR | Status: DC

## 2016-02-21 MED ORDER — BUPIVACAINE HCL (PF) 0.25 % IJ SOLN
INTRAMUSCULAR | Status: AC
  Filled 2016-02-21: qty 30

## 2016-02-21 MED ORDER — CEFAZOLIN SODIUM 1 G IJ SOLR
INTRAMUSCULAR | Status: AC
  Administered 2016-02-21: 1 g via INTRAVENOUS
  Filled 2016-02-21: qty 10

## 2016-02-21 MED ORDER — ORPHENADRINE CITRATE 30 MG/ML IJ SOLN
INTRAMUSCULAR | Status: AC
  Filled 2016-02-21: qty 2

## 2016-02-21 MED ORDER — CEFAZOLIN SODIUM 1-5 GM-% IV SOLN: 1.0000 g | Freq: Once | INTRAVENOUS | Status: DC

## 2016-02-21 MED ORDER — TRIAMCINOLONE ACETONIDE 40 MG/ML IJ SUSP
INTRAMUSCULAR | Status: AC
  Filled 2016-02-21: qty 1

## 2016-02-21 MED ORDER — LIDOCAINE HCL (PF) 1 % IJ SOLN
INTRAMUSCULAR | Status: AC
  Filled 2016-02-21: qty 5

## 2016-02-21 MED ORDER — ORPHENADRINE CITRATE 30 MG/ML IJ SOLN: 60.0000 mg | Freq: Once | INTRAMUSCULAR | Status: DC

## 2016-02-21 MED ORDER — SODIUM CHLORIDE 0.9 % IJ SOLN
INTRAMUSCULAR | Status: AC
  Administered 2016-02-21: 13:00:00
  Filled 2016-02-21: qty 20

## 2016-02-21 MED ORDER — FENTANYL CITRATE (PF) 100 MCG/2ML IJ SOLN: 100.0000 ug | Freq: Once | INTRAMUSCULAR | Status: DC

## 2016-02-21 MED ORDER — LACTATED RINGERS IV SOLN: 1000.0000 mL | INTRAVENOUS | Status: DC

## 2016-02-21 MED ORDER — SODIUM CHLORIDE 0.9% FLUSH: 20.0000 mL | Freq: Once | INTRAVENOUS | Status: DC

## 2016-02-21 MED ORDER — FENTANYL CITRATE (PF) 100 MCG/2ML IJ SOLN
INTRAMUSCULAR | Status: AC
  Administered 2016-02-21: 50 ug via INTRAVENOUS
  Filled 2016-02-21: qty 2

## 2016-02-21 MED ORDER — MIDAZOLAM HCL 5 MG/5ML IJ SOLN
INTRAMUSCULAR | Status: AC
  Filled 2016-02-21: qty 5

## 2016-02-21 MED ORDER — CEFUROXIME AXETIL 250 MG PO TABS: 250.0000 mg | ORAL_TABLET | Freq: Two times a day (BID) | ORAL | Status: DC

## 2016-02-21 MED ORDER — TRIAMCINOLONE ACETONIDE 40 MG/ML IJ SUSP: 40.0000 mg | Freq: Once | INTRAMUSCULAR | Status: DC

## 2016-02-21 MED ORDER — BUPIVACAINE HCL (PF) 0.25 % IJ SOLN: 30.0000 mL | Freq: Once | INTRAMUSCULAR | Status: DC

## 2016-02-21 NOTE — Progress Notes (Signed)
Subjective:    Patient ID: Karen Farley, female    DOB: [birth year], 70 y.o.   MRN: [id number]  HPI  PROCEDURE PERFORMED: Lumbar epidural steroid injection   NOTE: The patient is a 70 y.o. female who returns to [place] for further evaluation and treatment of pain involving the lumbar and lower extremity region. MRI revealed the patient to be with  Degenerative disc disease lumbar spine      EXAM:  MRI LUMBAR SPINE WITHOUT CONTRAST   FINDINGS:  Vertebral body height is maintained. Trace anterolisthesis L4 on L5  due to facet arthropathy is noted. 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 The conus medullaris is  normal in signal and position. Imaged intra-abdominal contents are  unremarkable.   The T10-11 and T11-12 levels are imaged in the sagittal plane only  and negative.   T12-L1: Tiny central protrusion without central canal or foraminal  narrowing is identified.   L1-2: Negative.   L2-3: Shallow disc bulge without central canal or foraminal  narrowing.   L3-4: Moderate facet arthropathy is identified. No disc bulge or  protrusion. The central canal and foramina are widely patent.   L4-5: Advanced bilateral facet degenerative change is present. The  disc is slightly uncovered but there is no bulge or protrusion. The  central canal and foramina are widely patent.   L5-S1: Bilateral facet arthropathy is present. Minimal disc bulge  and endplate spur without central canal or foraminal stenosis.   IMPRESSION:  Negative for central canal or foraminal narrowing with mild  degenerative disc disease as described above. Multilevel facet  arthropathy is present and appears worst at L4-5 and L5-S1        . There is concern regarding intraspinal abnormalities contributing to patient's symptomatology The risks, benefits, and expectations of the procedure have been discussed and  explained to the patient who was understanding and in agreement with suggested treatment plan. We will proceed with lumbar epidural steroid injection as discussed and as explained to the patient who is willing to proceed with procedure as planned.   DESCRIPTION OF PROCEDURE: Lumbar epidural steroid injection with IV Versed, IV fentanyl conscious sedation, EKG, blood pressure, pulse, capnography, and pulse oximetry monitoring. The procedure was performed with the patient in the prone position under fluoroscopic guidance. A local anesthetic skin wheal of 1.5% plain lidocaine was accomplished at proposed entry site. An 18-gauge Tuohy epidural needle was inserted at the L 4 vertebral body level left of the midline via loss-of-resistance technique with negative heme and negative CSF return. A total of 4 mL of Preservative-Free normal saline with 40 mg of Kenalog injected incrementally via epidurally placed needle. Needle was removed.  Myoneural block injections of the gluteal musculature region Following Betadine prep of proposed entry site a 25-gauge needle was inserted into the gluteal musculature region and following negative aspiration 2 cc of 0.25% bupivacaine with Norflex was injected for myoneural block injection of the gluteal musculature region times two.    A total of 40 mg of Kenalog was utilized for the procedure.   The patient tolerated the injection well.    PLAN:   1. Medications: We will continue presently prescribed medications. 2. Will consider modification of treatment regimen pending response to treatment rendered on today's visit and follow-up evaluation. 3. The patient is to follow-up with primary care physician Halina Andreas regarding blood pressure and general medical condition status post lumbar epidural steroid injection performed  on today's visit. 4. Surgical evaluation.Patient will undergo surgical reevaluation pending follow-up evaluation  5. Neurological evaluation.May  consider PNCV EMG studies and other studies  6. The patient may be a candidate for radiofrequency procedures, implantation device, and other treatment pending response to treatment and follow-up evaluation. 7. The patient has been advised to adhere to proper body mechanics and avoid activities which appear to aggravate condition. 8. The patient has been advised to call the [facility] Management Center prior to scheduled return appointment should there be significant change in condition or should there be sign  The patient is understanding and agrees with the suggested  treatment plan   Review of Systems     Objective:   Physical Exam        Assessment & Plan:

## 2016-02-21 NOTE — Patient Instructions (Addendum)
PLAN    Continue present medication Please obtain Ceftin antibiotic today and begin taking Ceftin antibiotic today as prescribed   F/U PCP Dr.Megan Wynetta Emery  for evaliation of  BP and general medical  condition.   F/U surgical evaluation. Follow-up with Dr. Arnoldo Morale as discussed  F/U neurological evaluation. May consider pending follow-up evaluations  May consider radiofrequency rhizolysis or intraspinal procedures pending response to present treatment and F/U evaluation   Patient to call Pain Management Center should patient have concerns prior to scheduled return appointment Pain Management Discharge Instructions  General Discharge Instructions :  If you need to reach your doctor call: Monday-Friday 8:00 am - 4:00 pm at [phone] or toll free [phone].  After clinic hours [phone] to have operator reach doctor. A prescription for Ceftin was sent to your pharmacy.  Bring all of your medication bottles to all your appointments in the pain clinic.  To cancel or reschedule your appointment with Pain Management please remember to call 24 hours in advance to avoid a fee.  Refer to the educational materials which you have been given on: General Risks, I had my Procedure. Discharge Instructions, Post Sedation.  Post Procedure Instructions:  The drugs you were given will stay in your system until tomorrow, so for the next 24 hours you should not drive, make any legal decisions or drink any alcoholic beverages.  You may eat anything you prefer, but it is better to start with liquids then soups and crackers, and gradually work up to solid foods.  Please notify your doctor immediately if you have any unusual bleeding, trouble breathing or pain that is not related to your normal pain.  Depending on the type of procedure that was done, some parts of your body may feel week and/or numb.  This usually clears up by tonight or the next day.  Walk with the use of an assistive device or  accompanied by an adult for the 24 hours.  You may use ice on the affected area for the first 24 hours.  Put ice in a Ziploc bag and cover with a towel and place against area 15 minutes on 15 minutes off.  You may switch to heat after 24 hours.

## 2016-02-21 NOTE — Progress Notes (Signed)
Patient here today for procedure d/t low back pain.  Safety precautions to be maintained throughout the outpatient stay will include: orient to surroundings, keep bed in low position, maintain call bell within reach at all times, provide assistance with transfer out of bed and ambulation.

## 2016-02-22 ENCOUNTER — Telehealth: Payer: Self-pay

## 2016-02-22 NOTE — Telephone Encounter (Signed)
Post procedure phone call.  No answer, no answering machine 

## 2016-03-13 ENCOUNTER — Encounter: Payer: Self-pay | Admitting: Pain Medicine

## 2016-03-13 ENCOUNTER — Ambulatory Visit: Payer: Medicare Other | Attending: Pain Medicine | Admitting: Pain Medicine

## 2016-03-13 VITALS — BP 129/93 | HR 117 | Temp 97.8°F | Resp 18 | Ht 61.0 in | Wt 231.0 lb

## 2016-03-13 DIAGNOSIS — M19041 Primary osteoarthritis, right hand: Secondary | ICD-10-CM | POA: Insufficient documentation

## 2016-03-13 DIAGNOSIS — M5136 Other intervertebral disc degeneration, lumbar region: Secondary | ICD-10-CM | POA: Insufficient documentation

## 2016-03-13 DIAGNOSIS — M47816 Spondylosis without myelopathy or radiculopathy, lumbar region: Secondary | ICD-10-CM

## 2016-03-13 DIAGNOSIS — M170 Bilateral primary osteoarthritis of knee: Secondary | ICD-10-CM | POA: Insufficient documentation

## 2016-03-13 DIAGNOSIS — E119 Type 2 diabetes mellitus without complications: Secondary | ICD-10-CM | POA: Insufficient documentation

## 2016-03-13 DIAGNOSIS — M19042 Primary osteoarthritis, left hand: Secondary | ICD-10-CM | POA: Insufficient documentation

## 2016-03-13 DIAGNOSIS — M1288 Other specific arthropathies, not elsewhere classified, other specified site: Secondary | ICD-10-CM | POA: Insufficient documentation

## 2016-03-13 DIAGNOSIS — M545 Low back pain: Secondary | ICD-10-CM | POA: Diagnosis present

## 2016-03-13 DIAGNOSIS — M79606 Pain in leg, unspecified: Secondary | ICD-10-CM | POA: Diagnosis present

## 2016-03-13 DIAGNOSIS — M533 Sacrococcygeal disorders, not elsewhere classified: Secondary | ICD-10-CM | POA: Diagnosis not present

## 2016-03-13 DIAGNOSIS — M5126 Other intervertebral disc displacement, lumbar region: Secondary | ICD-10-CM | POA: Insufficient documentation

## 2016-03-13 NOTE — Patient Instructions (Addendum)
PLAN    Continue present medication  Block of nerves to the sacroiliac joint to be performed at time of return appointment  F/U PCP Dr.Megan Wynetta Emery  for evaliation of  BP and general medical  condition  F/U surgical evaluation. Follow-up with Dr. Arnoldo Morale as discussed  F/U neurological evaluation. May consider PNCV/EMG studies and other studies pending follow-up evaluations  May consider radiofrequency rhizolysis or intraspinal procedures pending response to present treatment and F/U evaluation   Patient to call [facility] should patient have concerns prior to scheduled return appointment.Sacroiliac (SI) Joint Injection Patient Information  Description: The sacroiliac joint connects the scrum (very low back and tailbone) to the ilium (a pelvic bone which also forms half of the hip joint).  Normally this joint experiences very little motion.  When this joint becomes inflamed or unstable low back and or hip and pelvis pain may result.  Injection of this joint with local anesthetics (numbing medicines) and steroids can provide diagnostic information and reduce pain.  This injection is performed with the aid of x-ray guidance into the tailbone area while you are lying on your stomach.   You may experience an electrical sensation down the leg while this is being done.  You may also experience numbness.  We also may ask if we are reproducing your normal pain during the injection.  Conditions which may be treated SI injection:   Low back, buttock, hip or leg pain  Preparation for the Injection:  1. Do not eat any solid food or dairy products within 8 hours of your appointment.  2. You may drink clear liquids up to 3 hours before appointment.  Clear liquids include water, black coffee, juice or soda.  No milk or cream please. 3. You may take your regular medications, including pain medications with a sip of water before your appointment.  Diabetics should hold regular insulin (if  take separately) and take 1/2 normal NPH dose the morning of the procedure.  Carry some sugar containing items with you to your appointment. 4. A driver must accompany you and be prepared to drive you home after your procedure. 5. Bring all of your current medications with you. 6. An IV may be inserted and sedation may be given at the discretion of the physician. 7. A blood pressure cuff, EKG and other monitors will often be applied during the procedure.  Some patients may need to have extra oxygen administered for a short period.  8. You will be asked to provide medical information, including your allergies, prior to the procedure.  We must know immediately if you are taking blood thinners (like Coumadin/Warfarin) or if you are allergic to IV iodine contrast (dye).  We must know if you could possible be pregnant.  Possible side effects:   Bleeding from needle site  Infection (rare, may require surgery)  Nerve injury (rare)  Numbness & tingling (temporary)  A brief convulsion or seizure  Light-headedness (temporary)  Pain at injection site (several days)  Decreased blood pressure (temporary)  Weakness in the leg (temporary)   Call if you experience:   New onset weakness or numbness of an extremity below the injection site that last more than 8 hours.  Hives or difficulty breathing ( go to the emergency room)  Inflammation or drainage at the injection site  Any new symptoms which are concerning to you  Please note:  Although the local anesthetic injected can often make your back/ hip/ buttock/ leg feel good for several hours  after the injections, the pain will likely return.  It takes 3-7 days for steroids to work in the sacroiliac area.  You may not notice any pain relief for at least that one week.  If effective, we will often do a series of three injections spaced 3-6 weeks apart to maximally decrease your pain.  After the initial series, we generally will wait some  months before a repeat injection of the same type.  If you have any questions, please call [phone] [place]  What are the risk, side effects and possible complications? Generally speaking, most procedures are safe.  However, with any procedure there are risks, side effects, and the possibility of complications.  The risks and complications are dependent upon the sites that are lesioned, or the type of nerve block to be performed.  The closer the procedure is to the spine, the more serious the risks are.  Great care is taken when placing the radio frequency needles, block needles or lesioning probes, but sometimes complications can occur. 1. Infection: Any time there is an injection through the skin, there is a risk of infection.  This is why sterile conditions are used for these blocks.  There are four possible types of infection. 1. Localized skin infection. 2. Central Nervous System Infection-This can be in the form of Meningitis, which can be deadly. 3. Epidural Infections-This can be in the form of an epidural abscess, which can cause pressure inside of the spine, causing compression of the spinal cord with subsequent paralysis. This would require an emergency surgery to decompress, and there are no guarantees that the patient would recover from the paralysis. 4. Discitis-This is an infection of the intervertebral discs.  It occurs in about 1% of discography procedures.  It is difficult to treat and it may lead to surgery.        2. Pain: the needles have to go through skin and soft tissues, will cause soreness.       3. Damage to internal structures:  The nerves to be lesioned may be near blood vessels or    other nerves which can be potentially damaged.       4. Bleeding: Bleeding is more common if the patient is taking blood thinners such as  aspirin, Coumadin, Ticiid, Plavix, etc., or if he/she have some genetic  predisposition  such as hemophilia. Bleeding into the spinal canal can cause compression of the spinal  cord with subsequent paralysis.  This would require an emergency surgery to  decompress and there are no guarantees that the patient would recover from the  paralysis.       5. Pneumothorax:  Puncturing of a lung is a possibility, every time a needle is introduced in  the area of the chest or upper back.  Pneumothorax refers to free air around the  collapsed lung(s), inside of the thoracic cavity (chest cavity).  Another two possible  complications related to a similar event would include: Hemothorax and Chylothorax.   These are variations of the Pneumothorax, where instead of air around the collapsed  lung(s), you may have blood or chyle, respectively.       6. Spinal headaches: They may occur with any procedures in the area of the spine.       7. Persistent CSF (Cerebro-Spinal Fluid) leakage: This is a rare problem, but may occur  with prolonged intrathecal or epidural catheters either due to the formation of  a fistulous  track or a dural tear.       8. Nerve damage: By working so close to the spinal cord, there is always a possibility of  nerve damage, which could be as serious as a permanent spinal cord injury with  paralysis.       9. Death:  Although rare, severe deadly allergic reactions known as "Anaphylactic  reaction" can occur to any of the medications used.      10. Worsening of the symptoms:  We can always make thing worse.  What are the chances of something like this happening? Chances of any of this occuring are extremely low.  By statistics, you have more of a chance of getting killed in a motor vehicle accident: while driving to the hospital than any of the above occurring .  Nevertheless, you should be aware that they are possibilities.  In general, it is similar to taking a shower.  Everybody knows that you can slip, hit your head and get killed.  Does that mean that you should not  shower again?  Nevertheless always keep in mind that statistics do not mean anything if you happen to be on the wrong side of them.  Even if a procedure has a 1 (one) in a 1,000,000 (million) chance of going wrong, it you happen to be that one..Also, keep in mind that by statistics, you have more of a chance of having something go wrong when taking medications.  Who should not have this procedure? If you are on a blood thinning medication (e.g. Coumadin, Plavix, see list of "Blood Thinners"), or if you have an active infection going on, you should not have the procedure.  If you are taking any blood thinners, please inform your physician.  How should I prepare for this procedure?  Do not eat or drink anything at least six hours prior to the procedure.  Bring a driver with you .  It cannot be a taxi.  Come accompanied by an adult that can drive you back, and that is strong enough to help you if your legs get weak or numb from the local anesthetic.  Take all of your medicines the morning of the procedure with just enough water to swallow them.  If you have diabetes, make sure that you are scheduled to have your procedure done first thing in the morning, whenever possible.  If you have diabetes, take only half of your insulin dose and notify our nurse that you have done so as soon as you arrive at the clinic.  If you are diabetic, but only take blood sugar pills (oral hypoglycemic), then do not take them on the morning of your procedure.  You may take them after you have had the procedure.  Do not take aspirin or any aspirin-containing medications, at least eleven (11) days prior to the procedure.  They may prolong bleeding.  Wear loose fitting clothing that may be easy to take off and that you would not mind if it got stained with Betadine or blood.  Do not wear any jewelry or perfume  Remove any nail coloring.  It will interfere with some of our monitoring equipment.  NOTE: Remember that  this is not meant to be interpreted as a complete list of all possible complications.  Unforeseen problems may occur.  BLOOD THINNERS The following drugs contain aspirin or other products, which can cause increased bleeding during surgery and should not be taken for 2 weeks prior to and 1  week after surgery.  If you should need take something for relief of minor pain, you may take acetaminophen which is found in Tylenol,m Datril, Anacin-3 and Panadol. It is not blood thinner. The products listed below are.  Do not take any of the products listed below in addition to any listed on your instruction sheet.  A.P.C or A.P.C with Codeine Codeine Phosphate Capsules #3 Ibuprofen Ridaura  ABC compound Congesprin Imuran rimadil  Advil Cope Indocin Robaxisal  Alka-Seltzer Effervescent Pain Reliever and Antacid Coricidin or Coricidin-D  Indomethacin Rufen  Alka-Seltzer plus Cold Medicine Cosprin Ketoprofen S-A-C Tablets  Anacin Analgesic Tablets or Capsules Coumadin Korlgesic Salflex  Anacin Extra Strength Analgesic tablets or capsules CP-2 Tablets Lanoril Salicylate  Anaprox Cuprimine Capsules Levenox Salocol  Anexsia-D Dalteparin Magan Salsalate  Anodynos Darvon compound Magnesium Salicylate Sine-off  Ansaid Dasin Capsules Magsal Sodium Salicylate  Anturane Depen Capsules Marnal Soma  APF Arthritis pain formula Dewitt's Pills Measurin Stanback  Argesic Dia-Gesic Meclofenamic Sulfinpyrazone  Arthritis Bayer Timed Release Aspirin Diclofenac Meclomen Sulindac  Arthritis pain formula Anacin Dicumarol Medipren Supac  Analgesic (Safety coated) Arthralgen Diffunasal Mefanamic Suprofen  Arthritis Strength Bufferin Dihydrocodeine Mepro Compound Suprol  Arthropan liquid Dopirydamole Methcarbomol with Aspirin Synalgos  ASA tablets/Enseals Disalcid Micrainin Tagament  Ascriptin Doan's Midol Talwin  Ascriptin A/D Dolene Mobidin Tanderil  Ascriptin Extra Strength Dolobid Moblgesic Ticlid  Ascriptin with Codeine  Doloprin or Doloprin with Codeine Momentum Tolectin  Asperbuf Duoprin Mono-gesic Trendar  Aspergum Duradyne Motrin or Motrin IB Triminicin  Aspirin plain, buffered or enteric coated Durasal Myochrisine Trigesic  Aspirin Suppositories Easprin Nalfon Trillsate  Aspirin with Codeine Ecotrin Regular or Extra Strength Naprosyn Uracel  Atromid-S Efficin Naproxen Ursinus  Auranofin Capsules Elmiron Neocylate Vanquish  Axotal Emagrin Norgesic Verin  Azathioprine Empirin or Empirin with Codeine Normiflo Vitamin E  Azolid Emprazil Nuprin Voltaren  Bayer Aspirin plain, buffered or children's or timed BC Tablets or powders Encaprin Orgaran Warfarin Sodium  Buff-a-Comp Enoxaparin Orudis Zorpin  Buff-a-Comp with Codeine Equegesic Os-Cal-Gesic   Buffaprin Excedrin plain, buffered or Extra Strength Oxalid   Bufferin Arthritis Strength Feldene Oxphenbutazone   Bufferin plain or Extra Strength Feldene Capsules Oxycodone with Aspirin   Bufferin with Codeine Fenoprofen Fenoprofen Pabalate or Pabalate-SF   Buffets II Flogesic Panagesic   Buffinol plain or Extra Strength Florinal or Florinal with Codeine Panwarfarin   Buf-Tabs Flurbiprofen Penicillamine   Butalbital Compound Four-way cold tablets Penicillin   Butazolidin Fragmin Pepto-Bismol   Carbenicillin Geminisyn Percodan   Carna Arthritis Reliever Geopen Persantine   Carprofen Gold's salt Persistin   Chloramphenicol Goody's Phenylbutazone   Chloromycetin Haltrain Piroxlcam   Clmetidine heparin Plaquenil   Cllnoril Hyco-pap Ponstel   Clofibrate Hydroxy chloroquine Propoxyphen         Before stopping any of these medications, be sure to consult the physician who ordered them.  Some, such as Coumadin (Warfarin) are ordered to prevent or treat serious conditions such as "deep thrombosis", "pumonary embolisms", and other heart problems.  The amount of time that you may need off of the medication may also vary with the medication and the reason for which  you were taking it.  If you are taking any of these medications, please make sure you notify your pain physician before you undergo any procedures.

## 2016-03-13 NOTE — Progress Notes (Signed)
Subjective:    Patient ID: Karen Farley, female    DOB: 08/23/1946, 70 y.o.   MRN: [id number]  HPI  The patient is a 70 year old female who returns to pain management for further evaluation and treatment of pain involving the lower back and lower extremity region predominantly the patient states that her pain occurs in the region of the buttocks and is aggravated by standing walking and becomes more intense as the day progresses. The patient stated the pain involves the left lower back and buttocks region more than the right side. The patient denies any trauma change in events of daily living the call significant change in symptomatology. We discussed patient's condition and will consider patient for block of nerves to sacroiliac joint to be performed at time return appointment. The patient was with understanding and agreed with suggested treatment plan.        . Review of Systems     Objective:   Physical Exam  There was tends to palpation of the paraspinal must reason cervical region cervical facet region of minimal degree with minimal tenderness of the splenius capitis and occipitalis musculature region. Palpation of the acromioclavicular and glenohumeral joint regions reproduces minimal discomfort as well. The patient appeared to be with bilaterally equal grip strength and Tinel and Phalen's maneuver were without increased pain of significant degree. Palpation of the thoracic region thoracic facet region was attends to palpation of mild to moderate degree with moderate tenderness to palpation of the lower thoracic paraspinal musculature region with no crepitus of the thoracic region noted. Over the lumbar paraspinal musculatures and lumbar facet region was of increased pain with lateral bending rotation extension and palpation of the lumbar facets region. Palpation over the PSIS and PII S regions reproduce moderate severe discomfort on the left compared to the right. There was  increased pain with Patrick's maneuver as well. No definite sensory deficit or dermatomal distribution detected. There was negative clonus negative Homans. EHL strength appeared to be slightly decreased. Abdomen nontender with no costovertebral tenderness noted.      Assessment & Plan:       Degenerative disc disease lumbar spine      EXAM:  MRI LUMBAR SPINE WITHOUT CONTRAST   TECHNIQUE:  Multiplanar, multisequence MR imaging of the lumbar spine was  performed. No intravenous contrast was administered.   COMPARISON: None.   FINDINGS:  Vertebral body height is maintained. Trace anterolisthesis L4 on L5  due to facet arthropathy is noted. 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 The conus medullaris is  normal in signal and position. Imaged intra-abdominal contents are  unremarkable.   The T10-11 and T11-12 levels are imaged in the sagittal plane only  and negative.   T12-L1: Tiny central protrusion without central canal or foraminal  narrowing is identified.   L1-2: Negative.   L2-3: Shallow disc bulge without central canal or foraminal  narrowing.   L3-4: Moderate facet arthropathy is identified. No disc bulge or  protrusion. The central canal and foramina are widely patent.   L4-5: Advanced bilateral facet degenerative change is present. The  disc is slightly uncovered but there is no bulge or protrusion. The  central canal and foramina are widely patent.   L5-S1: Bilateral facet arthropathy is present. Minimal disc bulge  and endplate spur without central canal or foraminal stenosis.   IMPRESSION:  Negative for central canal or foraminal narrowing with mild  degenerative disc disease as described  above. Multilevel facet  arthropathy is present and appears worst at L4-5 and L5-S1   Lumbar facet syndrome  Sacroiliac joint dysfunction   Osteoarthritis of knees and fingers    Diabetes mellitus      PLAN    Continue present medication  Block of nerves to the sacroiliac joint to be performed at time of return appointment  F/U PCP Dr.Megan Wynetta Emery  for evaliation of  BP and general medical  condition  F/U surgical evaluation. Follow-up with Dr. Arnoldo Morale as discussed  F/U neurological evaluation. May consider PNCV/EMG studies and other studies pending follow-up evaluations  May consider radiofrequency rhizolysis or intraspinal procedures pending response to present treatment and F/U evaluation   Patient to call [facility] should patient have concerns prior to scheduled return appointment.

## 2016-03-25 ENCOUNTER — Ambulatory Visit: Payer: Medicare Other | Attending: Pain Medicine | Admitting: Pain Medicine

## 2016-03-25 VITALS — BP 92/49 | HR 83 | Temp 96.8°F | Resp 17 | Ht 61.0 in | Wt 231.0 lb

## 2016-03-25 DIAGNOSIS — M79606 Pain in leg, unspecified: Secondary | ICD-10-CM | POA: Diagnosis present

## 2016-03-25 DIAGNOSIS — M5136 Other intervertebral disc degeneration, lumbar region: Secondary | ICD-10-CM | POA: Diagnosis not present

## 2016-03-25 DIAGNOSIS — M545 Low back pain: Secondary | ICD-10-CM | POA: Insufficient documentation

## 2016-03-25 DIAGNOSIS — M47816 Spondylosis without myelopathy or radiculopathy, lumbar region: Secondary | ICD-10-CM

## 2016-03-25 DIAGNOSIS — M1288 Other specific arthropathies, not elsewhere classified, other specified site: Secondary | ICD-10-CM | POA: Insufficient documentation

## 2016-03-25 DIAGNOSIS — M533 Sacrococcygeal disorders, not elsewhere classified: Secondary | ICD-10-CM

## 2016-03-25 MED ORDER — TRIAMCINOLONE ACETONIDE 40 MG/ML IJ SUSP
40.0000 mg | Freq: Once | INTRAMUSCULAR | Status: AC
  Administered 2016-03-25: 40 mg
  Filled 2016-03-25: qty 1

## 2016-03-25 MED ORDER — BUPIVACAINE HCL (PF) 0.25 % IJ SOLN
30.0000 mL | Freq: Once | INTRAMUSCULAR | Status: AC
  Administered 2016-03-25: 30 mL
  Filled 2016-03-25: qty 30

## 2016-03-25 MED ORDER — LACTATED RINGERS IV SOLN: 1000.0000 mL | INTRAVENOUS | Status: DC

## 2016-03-25 MED ORDER — MIDAZOLAM HCL 5 MG/5ML IJ SOLN
5.0000 mg | Freq: Once | INTRAMUSCULAR | Status: AC
Start: 2016-03-25 — End: 2016-03-25
  Administered 2016-03-25: 5 mg via INTRAVENOUS
  Filled 2016-03-25: qty 5

## 2016-03-25 MED ORDER — FENTANYL CITRATE (PF) 100 MCG/2ML IJ SOLN
100.0000 ug | Freq: Once | INTRAMUSCULAR | Status: AC
  Administered 2016-03-25: 100 ug via INTRAVENOUS
  Filled 2016-03-25: qty 2

## 2016-03-25 MED ORDER — CEFUROXIME AXETIL 250 MG PO TABS: 250.0000 mg | ORAL_TABLET | Freq: Two times a day (BID) | ORAL | Status: DC

## 2016-03-25 MED ORDER — CEFAZOLIN SODIUM 1-5 GM-% IV SOLN
1.0000 g | Freq: Once | INTRAVENOUS | Status: AC
  Administered 2016-03-25: 1 g via INTRAVENOUS

## 2016-03-25 MED ORDER — ORPHENADRINE CITRATE 30 MG/ML IJ SOLN
60.0000 mg | Freq: Once | INTRAMUSCULAR | Status: AC
  Administered 2016-03-25: 60 mg via INTRAMUSCULAR
  Filled 2016-03-25: qty 2

## 2016-03-25 NOTE — Progress Notes (Signed)
Subjective:    Patient ID: Karen Farley, female    DOB: [birth year], 70 y.o.   MRN: [id number]  HPI  PROCEDURE:  Block of nerves to the sacroiliac joint.   NOTE:  The patient is a 70 y.o. female who returns to the [facility] for further evaluation and treatment of pain involving the lower back and lower extremity region with pain in the region of the buttocks as well. Prior MRI revealed no significant  central canal or foraminal narrowing with mild  degenerative disc disease as described above. Multilevel facet  arthropathy is present and appears worst at L4-5 and L5-S1.  the patient is with reproduction of severe pain with palpation of the PSIS and PII S regions and is with positive Patrick's maneuver There is concern regarding a significant component of the patient's pain being due to sacroiliac joint dysfunction The risks, benefits, expectations of the procedure have been discussed and explained to the patient who is understanding and willing to proceed with interventional treatment in attempt to decrease severity of patient's symptoms, minimize the risk of medication escalation and  hopefully retard the progression of the patient's symptoms. We will proceed with what is felt to be a medically necessary procedure, block of nerves to the sacroiliac joint.   DESCRIPTION OF PROCEDURE:  Block of nerves to the sacroiliac joint.   The patient was taken to the fluoroscopy suite. With the patient in the prone position with EKG, blood pressure, pulse, capnography, and pulse oximetry monitoring, IV Versed, IV fentanyl conscious sedation, Betadine prep of proposed entry site was performed.   Block of nerves at the L5 vertebral body level.   With the patient in prone position, under fluoroscopic guidance, a 22 -gauge needle was inserted at the L5 vertebral body level on the left side. With 15 degrees oblique orientation a 22 -gauge needle was inserted in the region known as Burton's eye  or eye of the Scotty dog. Following documentation of needle placement in the area of Burton's eye or eye of the Scotty dog under fluoroscopic guidance, needle placement was then accomplished at the sacral ala level on the left side.   Needle placement at the sacral ala.   With the patient in prone position under fluoroscopic guidance with AP view of the lumbosacral spine, a 22 -gauge needle was inserted in the region known as the sacral ala on the left side. Following documentation of needle placement on the left side under fluoroscopic guidance needle placement was then accomplished at the S1 foramen level.   Needle placement at the S1 foramen level.   With the patient in prone position under fluoroscopic guidance with AP view of the lumbosacral spine and cephalad orientation, a 22 -gauge needle was inserted at the superior and lateral border of the S1 foramen on the left side. Following documentation of needle placement at the S1 foramen level on the left side, needle placement was then accomplished at the S2 foramen level on the left side.   Needle placement at the S2 foramen level.   With the patient in prone position with AP view of the lumbosacral spine with cephalad orientation, a 22 - gauge needle was inserted at the superior and lateral border of the S2 foramen under fluoroscopic guidance on the left side. Following needle placement at the L5 vertebral body level, sacral ala, S1 foramen and S2 foramen on the left side, needle placement was verified on lateral view under fluoroscopic guidance.  Following needle placement  documentation on lateral view, each needle was injected with 1 mL of 0.25% bupivacaine and Kenalog.   BLOCK OF THE NERVES TO SACROILIAC JOINT ON THE RIGHT SIDE The procedure was performed on the right side at the same levels as was performed on the left side and utilizing the same technique as on the left side and was performed under fluoroscopic guidance as on the left  side   A total of 10mg  of Kenalog was utilized for the procedure.   PLAN:  1. Medications: The patient will continue presently prescribed medications.  2. The patient will be considered for modification of treatment regimen pending response to the procedure performed on today's visit.  3. The patient is to follow-up with primary care physician  Park Liter for evaluation of blood pressure and general medical condition following the procedure performed on today's visit.  4. Surgical evaluation as discussed.  5. Neurological evaluation as discussed.  6. The patient may be a candidate for radiofrequency procedures, implantation devices and other treatment pending response to treatment performed on today's visit and follow-up evaluation.  7. The patient has been advised to adhere to proper body mechanics and to avoid activities which may exacerbate the patient's symptoms.   Return appointment to [facility] as scheduled.    Review of Systems     Objective:   Physical Exam        Assessment & Plan:

## 2016-03-25 NOTE — Patient Instructions (Addendum)
PLAN    Continue present medication Please obtain Ceftin antibiotic today and begin taking Ceftin antibiotic today as prescribed   F/U PCP Dr.Megan Wynetta Emery  for evaliation of  BP and general medical  condition.. Please see primary care physician this week for evaluation of elevated blood pressure as we discussed  F/U surgical evaluation. Follow-up with Dr. Arnoldo Morale as discussed  F/U neurological evaluation. May consider PNCV/EMG studies and other studies pending follow-up evaluations  May consider radiofrequency rhizolysis or intraspinal procedures pending response to present treatment and F/U evaluation   Patient to call Pain Management Center should patient have concerns prior to scheduled return appointmentSelective Nerve Root Block Patient Information  Description: Specific nerve roots exit the spinal canal and these nerves can be compressed and inflamed by a bulging disc and bone spurs.  By injecting steroids on the nerve root, we can potentially decrease the inflammation surrounding these nerves, which often leads to decreased pain.  Also, by injecting local anesthesia on the nerve root, this can provide [place] helpful information to give to your referring doctor if it decreases your pain.  Selective nerve root blocks can be done along the spine from the neck to the low back depending on the location of your pain.   After numbing the skin with local anesthesia, a small needle is passed to the nerve root and the position of the needle is verified using x-ray pictures.  After the needle is in correct position, we then deposit the medication.  You may experience a pressure sensation while this is being done.  The entire block usually lasts less than 15 minutes.  Conditions that may be treated with selective nerve root blocks:  Low back and leg pain  Spinal stenosis  Diagnostic block prior to potential surgery  Neck and arm pain  Post laminectomy syndrome  Preparation for the  injection:  1. Do not eat any solid food or dairy products within 8 hours of your appointment. 2. You may drink clear liquids up to 3 hours before an appointment.  Clear liquids include water, black coffee, juice or soda.  No milk or cream please. 3. You may take your regular medications, including pain medications, with a sip of water before your appointment.  Diabetics should hold regular insulin (if taken separately) and take 1/2 normal NPH dose the morning of the procedure.  Carry some sugar containing items with you to your appointment. 4. A driver must accompany you and be prepared to drive you home after your procedure. 5. Bring all your current medications with you. 6. An IV may be inserted and sedation may be given at the discretion of the physician. 7. A blood pressure cuff, EKG, and other monitors will often be applied during the procedure.  Some patients may need to have extra oxygen administered for a short period. 8. You will be asked to provide medical information, including allergies, prior to the procedure.  We must know immediately if you are taking blood  Thinners (like Coumadin) or if you are allergic to IV iodine contrast (dye).  Possible side-effects: All are usually temporary  Bleeding from needle site  Light headedness  Numbness and tingling  Decreased blood pressure  Weakness in arms/legs  Pressure sensation in back/neck  Pain at injection site (several days)  Possible complications: All are extremely rare  Infection  Nerve injury  Spinal headache (a headache wore with upright position)  Call if you experience:  Fever/chills associated with headache or increased back/neck pain  Headache worsened by an upright position  New onset weakness or numbness of an extremity below the injection site  Hives or difficulty breathing (go to the emergency room)  Inflammation or drainage at the injection site(s)  Severe back/neck pain greater than  usual  New symptoms which are concerning to you  Please note:  Although the local anesthetic injected can often make your back or neck feel good for several hours after the injection the pain will likely return.  It takes 3-5 days for steroids to work on the nerve root. You may not notice any pain relief for at least one week.  If effective, we will often do a series of 3 injections spaced 3-6 weeks apart to maximally decrease your pain.    If you have any questions, please call [phone] [facility] Discharge Inst[facility]  General Discharge Instructions :  If you need to reach your doctor call: Monday-Friday 8:00 am - 4:00 pm at [phone] or toll free [phone].  After clinic hours [phone] to have operator reach doctor.  Bring all of your medication bottles to all your appointments in the pain clinic.  To cancel or reschedule your appointment with Pain Management please remember to call 24 hours in advance to avoid a fee.  Refer to the educational materials which you have been given on: General Risks, I had my Procedure. Discharge Instructions, Post Sedation.  Post Procedure Instructions:  The drugs you were given will stay in your system until tomorrow, so for the next 24 hours you should not drive, make any legal decisions or drink any alcoholic beverages.  You may eat anything you prefer, but it is better to start with liquids then soups and crackers, and gradually work up to solid foods.  Please notify your doctor immediately if you have any unusual bleeding, trouble breathing or pain that is not related to your normal pain.  Depending on the type of procedure that was done, some parts of your body may feel week and/or numb.  This usually clears up by tonight or the next day.  Walk with the use of an assistive device or accompanied by an adult for the 24 hours.  You may use ice on the affected area for the first  24 hours.  Put ice in a Ziploc bag and cover with a towel and place against area 15 minutes on 15 minutes off.  You may switch to heat after 24 hours.

## 2016-03-26 ENCOUNTER — Telehealth: Payer: Self-pay | Admitting: *Deleted

## 2016-03-26 NOTE — Telephone Encounter (Signed)
Attempted to call patient, no answer and no answering machine

## 2016-04-09 ENCOUNTER — Ambulatory Visit: Payer: Medicare Other | Admitting: Pain Medicine

## 2016-04-24 ENCOUNTER — Ambulatory Visit: Payer: Medicare Other | Admitting: Pain Medicine

## 2016-05-02 ENCOUNTER — Ambulatory Visit: Payer: Medicare Other | Attending: Pain Medicine | Admitting: Pain Medicine

## 2016-05-02 ENCOUNTER — Encounter: Payer: Self-pay | Admitting: Pain Medicine

## 2016-05-02 VITALS — BP 122/72 | HR 98 | Temp 97.7°F | Resp 18 | Ht 61.0 in | Wt 231.0 lb

## 2016-05-02 DIAGNOSIS — M19042 Primary osteoarthritis, left hand: Secondary | ICD-10-CM | POA: Diagnosis not present

## 2016-05-02 DIAGNOSIS — D1809 Hemangioma of other sites: Secondary | ICD-10-CM | POA: Insufficient documentation

## 2016-05-02 DIAGNOSIS — M47816 Spondylosis without myelopathy or radiculopathy, lumbar region: Secondary | ICD-10-CM

## 2016-05-02 DIAGNOSIS — M1288 Other specific arthropathies, not elsewhere classified, other specified site: Secondary | ICD-10-CM | POA: Insufficient documentation

## 2016-05-02 DIAGNOSIS — M170 Bilateral primary osteoarthritis of knee: Secondary | ICD-10-CM | POA: Insufficient documentation

## 2016-05-02 DIAGNOSIS — M19041 Primary osteoarthritis, right hand: Secondary | ICD-10-CM | POA: Diagnosis not present

## 2016-05-02 DIAGNOSIS — E119 Type 2 diabetes mellitus without complications: Secondary | ICD-10-CM | POA: Insufficient documentation

## 2016-05-02 DIAGNOSIS — M5124 Other intervertebral disc displacement, thoracic region: Secondary | ICD-10-CM | POA: Diagnosis not present

## 2016-05-02 DIAGNOSIS — M545 Low back pain: Secondary | ICD-10-CM | POA: Diagnosis present

## 2016-05-02 DIAGNOSIS — M5136 Other intervertebral disc degeneration, lumbar region: Secondary | ICD-10-CM | POA: Diagnosis not present

## 2016-05-02 DIAGNOSIS — M533 Sacrococcygeal disorders, not elsewhere classified: Secondary | ICD-10-CM | POA: Diagnosis not present

## 2016-05-02 NOTE — Patient Instructions (Addendum)
PLAN    Continue present medication  Cluneal and sciatic nerve block at time of return appointment  F/U PCP Dr.Megan Wynetta Emery  for evaliation of  BP and general medical  condition  F/U surgical evaluation. Follow-up with Dr. Arnoldo Morale as discussed  F/U neurological evaluation. May consider PNCV/EMG studies and other studies pending follow-up evaluations  May consider radiofrequency rhizolysis or intraspinal procedures pending response to present treatment and F/U evaluation   Patient to call Pain Management Center should patient have concerns prior to scheduled return appointment.GENERAL RISKS AND COMPLICATIONS  What are the risk, side effects and possible complications? Generally speaking, most procedures are safe.  However, with any procedure there are risks, side effects, and the possibility of complications.  The risks and complications are dependent upon the sites that are lesioned, or the type of nerve block to be performed.  The closer the procedure is to the spine, the more serious the risks are.  Great care is taken when placing the radio frequency needles, block needles or lesioning probes, but sometimes complications can occur. 1. Infection: Any time there is an injection through the skin, there is a risk of infection.  This is why sterile conditions are used for these blocks.  There are four possible types of infection. 1. Localized skin infection. 2. Central Nervous System Infection-This can be in the form of Meningitis, which can be deadly. 3. Epidural Infections-This can be in the form of an epidural abscess, which can cause pressure inside of the spine, causing compression of the spinal cord with subsequent paralysis. This would require an emergency surgery to decompress, and there are no guarantees that the patient would recover from the paralysis. 4. Discitis-This is an infection of the intervertebral discs.  It occurs in about 1% of discography procedures.  It is difficult to  treat and it may lead to surgery.        2. Pain: the needles have to go through skin and soft tissues, will cause soreness.       3. Damage to internal structures:  The nerves to be lesioned may be near blood vessels or    other nerves which can be potentially damaged.       4. Bleeding: Bleeding is more common if the patient is taking blood thinners such as  aspirin, Coumadin, Ticiid, Plavix, etc., or if he/she have some genetic predisposition  such as hemophilia. Bleeding into the spinal canal can cause compression of the spinal  cord with subsequent paralysis.  This would require an emergency surgery to  decompress and there are no guarantees that the patient would recover from the  paralysis.       5. Pneumothorax:  Puncturing of a lung is a possibility, every time a needle is introduced in  the area of the chest or upper back.  Pneumothorax refers to free air around the  collapsed lung(s), inside of the thoracic cavity (chest cavity).  Another two possible  complications related to a similar event would include: Hemothorax and Chylothorax.   These are variations of the Pneumothorax, where instead of air around the collapsed  lung(s), you may have blood or chyle, respectively.       6. Spinal headaches: They may occur with any procedures in the area of the spine.       7. Persistent CSF (Cerebro-Spinal Fluid) leakage: This is a rare problem, but may occur  with prolonged intrathecal or epidural catheters either due to the formation of a fistulous  track or a dural tear.       8. Nerve damage: By working so close to the spinal cord, there is always a possibility of  nerve damage, which could be as serious as a permanent spinal cord injury with  paralysis.       9. Death:  Although rare, severe deadly allergic reactions known as "Anaphylactic  reaction" can occur to any of the medications used.      10. Worsening of the symptoms:  We can always make thing worse.  What are the chances of something  like this happening? Chances of any of this occuring are extremely low.  By statistics, you have more of a chance of getting killed in a motor vehicle accident: while driving to the hospital than any of the above occurring .  Nevertheless, you should be aware that they are possibilities.  In general, it is similar to taking a shower.  Everybody knows that you can slip, hit your head and get killed.  Does that mean that you should not shower again?  Nevertheless always keep in mind that statistics do not mean anything if you happen to be on the wrong side of them.  Even if a procedure has a 1 (one) in a 1,000,000 (million) chance of going wrong, it you happen to be that one..Also, keep in mind that by statistics, you have more of a chance of having something go wrong when taking medications.  Who should not have this procedure? If you are on a blood thinning medication (e.g. Coumadin, Plavix, see list of "Blood Thinners"), or if you have an active infection going on, you should not have the procedure.  If you are taking any blood thinners, please inform your physician.  How should I prepare for this procedure?  Do not eat or drink anything at least six hours prior to the procedure.  Bring a driver with you .  It cannot be a taxi.  Come accompanied by an adult that can drive you back, and that is strong enough to help you if your legs get weak or numb from the local anesthetic.  Take all of your medicines the morning of the procedure with just enough water to swallow them.  If you have diabetes, make sure that you are scheduled to have your procedure done first thing in the morning, whenever possible.  If you have diabetes, take only half of your insulin dose and notify our nurse that you have done so as soon as you arrive at the clinic.  If you are diabetic, but only take blood sugar pills (oral hypoglycemic), then do not take them on the morning of your procedure.  You may take them after you  have had the procedure.  Do not take aspirin or any aspirin-containing medications, at least eleven (11) days prior to the procedure.  They may prolong bleeding.  Wear loose fitting clothing that may be easy to take off and that you would not mind if it got stained with Betadine or blood.  Do not wear any jewelry or perfume  Remove any nail coloring.  It will interfere with some of our monitoring equipment.  NOTE: Remember that this is not meant to be interpreted as a complete list of all possible complications.  Unforeseen problems may occur.  BLOOD THINNERS The following drugs contain aspirin or other products, which can cause increased bleeding during surgery and should not be taken for 2 weeks prior to and 1 week after surgery.  If you should need take something for relief of minor pain, you may take acetaminophen which is found in Tylenol,m Datril, Anacin-3 and Panadol. It is not blood thinner. The products listed below are.  Do not take any of the products listed below in addition to any listed on your instruction sheet.  A.P.C or A.P.C with Codeine Codeine Phosphate Capsules #3 Ibuprofen Ridaura  ABC compound Congesprin Imuran rimadil  Advil Cope Indocin Robaxisal  Alka-Seltzer Effervescent Pain Reliever and Antacid Coricidin or Coricidin-D  Indomethacin Rufen  Alka-Seltzer plus Cold Medicine Cosprin Ketoprofen S-A-C Tablets  Anacin Analgesic Tablets or Capsules Coumadin Korlgesic Salflex  Anacin Extra Strength Analgesic tablets or capsules CP-2 Tablets Lanoril Salicylate  Anaprox Cuprimine Capsules Levenox Salocol  Anexsia-D Dalteparin Magan Salsalate  Anodynos Darvon compound Magnesium Salicylate Sine-off  Ansaid Dasin Capsules Magsal Sodium Salicylate  Anturane Depen Capsules Marnal Soma  APF Arthritis pain formula Dewitt's Pills Measurin Stanback  Argesic Dia-Gesic Meclofenamic Sulfinpyrazone  Arthritis Bayer Timed Release Aspirin Diclofenac Meclomen Sulindac  Arthritis pain  formula Anacin Dicumarol Medipren Supac  Analgesic (Safety coated) Arthralgen Diffunasal Mefanamic Suprofen  Arthritis Strength Bufferin Dihydrocodeine Mepro Compound Suprol  Arthropan liquid Dopirydamole Methcarbomol with Aspirin Synalgos  ASA tablets/Enseals Disalcid Micrainin Tagament  Ascriptin Doan's Midol Talwin  Ascriptin A/D Dolene Mobidin Tanderil  Ascriptin Extra Strength Dolobid Moblgesic Ticlid  Ascriptin with Codeine Doloprin or Doloprin with Codeine Momentum Tolectin  Asperbuf Duoprin Mono-gesic Trendar  Aspergum Duradyne Motrin or Motrin IB Triminicin  Aspirin plain, buffered or enteric coated Durasal Myochrisine Trigesic  Aspirin Suppositories Easprin Nalfon Trillsate  Aspirin with Codeine Ecotrin Regular or Extra Strength Naprosyn Uracel  Atromid-S Efficin Naproxen Ursinus  Auranofin Capsules Elmiron Neocylate Vanquish  Axotal Emagrin Norgesic Verin  Azathioprine Empirin or Empirin with Codeine Normiflo Vitamin E  Azolid Emprazil Nuprin Voltaren  Bayer Aspirin plain, buffered or children's or timed BC Tablets or powders Encaprin Orgaran Warfarin Sodium  Buff-a-Comp Enoxaparin Orudis Zorpin  Buff-a-Comp with Codeine Equegesic Os-Cal-Gesic   Buffaprin Excedrin plain, buffered or Extra Strength Oxalid   Bufferin Arthritis Strength Feldene Oxphenbutazone   Bufferin plain or Extra Strength Feldene Capsules Oxycodone with Aspirin   Bufferin with Codeine Fenoprofen Fenoprofen Pabalate or Pabalate-SF   Buffets II Flogesic Panagesic   Buffinol plain or Extra Strength Florinal or Florinal with Codeine Panwarfarin   Buf-Tabs Flurbiprofen Penicillamine   Butalbital Compound Four-way cold tablets Penicillin   Butazolidin Fragmin Pepto-Bismol   Carbenicillin Geminisyn Percodan   Carna Arthritis Reliever Geopen Persantine   Carprofen Gold's salt Persistin   Chloramphenicol Goody's Phenylbutazone   Chloromycetin Haltrain Piroxlcam   Clmetidine heparin Plaquenil   Cllnoril  Hyco-pap Ponstel   Clofibrate Hydroxy chloroquine Propoxyphen         Before stopping any of these medications, be sure to consult the physician who ordered them.  Some, such as Coumadin (Warfarin) are ordered to prevent or treat serious conditions such as "deep thrombosis", "pumonary embolisms", and other heart problems.  The amount of time that you may need off of the medication may also vary with the medication and the reason for which you were taking it.  If you are taking any of these medications, please make sure you notify your pain physician before you undergo any procedures.

## 2016-05-02 NOTE — Progress Notes (Signed)
Safety precautions to be maintained throughout the outpatient stay will include: orient to surroundings, keep bed in low position, maintain call bell within reach at all times, provide assistance with transfer out of bed and ambulation.  

## 2016-05-02 NOTE — Progress Notes (Signed)
Subjective:    Patient ID: Karen Farley, female    DOB: 07-12-1946, 70 y.o.   MRN: [id number]  HPI  Patient is a 70 year old female who returns to pain management for further evaluation and treatment of pain involving the lower back lower extremity region. The patient is undergone evaluation by Dr. Arnoldo Morale without plans for surgical intervention. The patient's pain was reproduced with palpation over the gluteal and piriformis musculature regions of significant degree. There is concern regarding facet syndrome as well as sacroiliac joint dysfunction treatment of patient's symptomatology. At the present time we will schedule patient for cluneal and sciatic nerve blocks and we will remain upright consider additional modifications of treatment pending response to treatment and follow-up evaluation. All agreed to suggested treatment plan    Review of Systems     Objective:   Physical Exam   There was tenderness to palpation of the cervical facet cervical paraspinal musculature region a mild degree with mild tenderness to palpation over the thoracic facet thoracic paraspinal musculature region. Palpation of the acromioclavicular and glenohumeral joint regions reproduces minimal discomfort. The patient was with bilaterally equal grip strength and Tinel and Phalen's maneuver were without increase of pain of significant degree. Palpation over the lumbar region lumbar paraspinal musculature region was with moderate to tenderness to palpation with most severe tenderness to palpation with the was reproduced with palpation of the PSIS and PII S regions. There was straight leg raising tolerates approximately 20 without increased pain with dorsiflexion noted. No sensory deficit of dermatomal distribution detected. There appeared to be negative clonus negative Homans. There was tenderness to palpation of the knees with negative anterior and posterior drawer signs without ballottement of the patella. Abdomen  was nontender with no costovertebral tenderness noted     Assessment & Plan:        EXAM:  MRI LUMBAR SPINE WITHOUT CONTRAST   TECHNIQUE:  Multiplanar, multisequence MR imaging of the lumbar spine was  performed. No intravenous contrast was administered.   COMPARISON: None.   FINDINGS:  Vertebral body height is maintained. Trace anterolisthesis L4 on L5  due to facet arthropathy is noted. 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 The conus medullaris is  normal in signal and position. Imaged intra-abdominal contents are  unremarkable.   The T10-11 and T11-12 levels are imaged in the sagittal plane only  and negative.   T12-L1: Tiny central protrusion without central canal or foraminal  narrowing is identified.   L1-2: Negative.   L2-3: Shallow disc bulge without central canal or foraminal  narrowing.   L3-4: Moderate facet arthropathy is identified. No disc bulge or  protrusion. The central canal and foramina are widely patent.   L4-5: Advanced bilateral facet degenerative change is present. The  disc is slightly uncovered but there is no bulge or protrusion. The  central canal and foramina are widely patent.   L5-S1: Bilateral facet arthropathy is present. Minimal disc bulge  and endplate spur without central canal or foraminal stenosis.   IMPRESSION:  Negative for central canal or foraminal narrowing with mild  degenerative disc disease as described above. Multilevel facet  arthropathy is present and appears worst at L4-5 and L5-S1   Lumbar facet syndrome  Sacroiliac joint dysfunction   Osteoarthritis of knees and fingers   Diabetes mellitus        PLAN    Continue present medication  Cluneal and sciatic nerve block at time of return  appointment  F/U PCP Dr.Megan Wynetta Emery  for evaliation of  BP and general medical  condition  F/U surgical evaluation. Follow-up with Dr.  Arnoldo Morale as discussed  F/U neurological evaluation. May consider PNCV/EMG studies and other studies pending follow-up evaluations  May consider radiofrequency rhizolysis or intraspinal procedures pending response to present treatment and F/U evaluation   Patient to call Pain Management Center should patient have concerns prior to scheduled return appointment.

## 2016-05-07 ENCOUNTER — Other Ambulatory Visit: Payer: Self-pay | Admitting: Family Medicine

## 2016-05-13 ENCOUNTER — Ambulatory Visit: Payer: Medicare Other | Admitting: Family Medicine

## 2016-05-22 ENCOUNTER — Ambulatory Visit: Payer: Medicare Other | Admitting: Pain Medicine

## 2016-06-04 ENCOUNTER — Telehealth: Payer: Self-pay | Admitting: Family Medicine

## 2016-06-04 NOTE — Telephone Encounter (Signed)
Pt called stated she has 3 doses of Benazepril left. Stated she has an appt on 06/20/16. Pt just needs enough meds to last until appt date. Pharm is [facility] on [address] in [place]. Thanks.

## 2016-06-05 MED ORDER — BENAZEPRIL-HYDROCHLOROTHIAZIDE 20-25 MG PO TABS: 1.0000 | ORAL_TABLET | Freq: Every day | ORAL | 0 refills | Status: DC

## 2016-06-05 NOTE — Telephone Encounter (Signed)
Rx sent to her pharmacy 

## 2016-06-20 ENCOUNTER — Ambulatory Visit: Payer: Medicare Other | Admitting: Family Medicine

## 2016-06-28 ENCOUNTER — Ambulatory Visit (INDEPENDENT_AMBULATORY_CARE_PROVIDER_SITE_OTHER): Payer: Medicare Other | Admitting: Family Medicine

## 2016-06-28 ENCOUNTER — Encounter: Payer: Self-pay | Admitting: Family Medicine

## 2016-06-28 VITALS — BP 128/82 | HR 103 | Temp 97.5°F | Ht 61.1 in | Wt 231.0 lb

## 2016-06-28 DIAGNOSIS — E785 Hyperlipidemia, unspecified: Secondary | ICD-10-CM

## 2016-06-28 DIAGNOSIS — I10 Essential (primary) hypertension: Secondary | ICD-10-CM

## 2016-06-28 DIAGNOSIS — E119 Type 2 diabetes mellitus without complications: Secondary | ICD-10-CM | POA: Diagnosis not present

## 2016-06-28 LAB — LIPID PANEL PICCOLO, WAIVED
CHOL/HDL RATIO PICCOLO,WAIVE: 3.9 mg/dL
CHOLESTEROL PICCOLO, WAIVED: 211 mg/dL — AB (ref <200)
HDL CHOL PICCOLO, WAIVED: 54 mg/dL — AB (ref >59)
LDL CHOL CALC PICCOLO WAIVED: 116 mg/dL — AB (ref <100)
TRIGLYCERIDES PICCOLO,WAIVED: 204 mg/dL — AB (ref <150)
VLDL Chol Calc Piccolo,Waive: 41 mg/dL — ABNORMAL HIGH (ref <30)

## 2016-06-28 LAB — HEMOGLOBIN A1C: Hemoglobin A1C: 6.6

## 2016-06-28 LAB — BAYER DCA HB A1C WAIVED: HB A1C (BAYER DCA - WAIVED): 6.6 % (ref <7.0)

## 2016-06-28 MED ORDER — METFORMIN HCL ER (OSM) 1000 MG PO TB24: 1000.0000 mg | ORAL_TABLET | Freq: Two times a day (BID) | ORAL | 1 refills | Status: DC

## 2016-06-28 MED ORDER — BENAZEPRIL-HYDROCHLOROTHIAZIDE 20-25 MG PO TABS: 1.0000 | ORAL_TABLET | Freq: Every day | ORAL | 1 refills | Status: DC

## 2016-06-28 NOTE — Assessment & Plan Note (Signed)
Improved. Patient refuses statin. Continue to monitor. Continue to work on diet and exercise.

## 2016-06-28 NOTE — Progress Notes (Signed)
BP 128/82 (BP Location: Left Arm, Patient Position: Sitting, Cuff Size: Large)   Pulse (!) 103   Temp 97.5 F (36.4 C)   Ht 5' 1.1" (1.552 m)   Wt 231 lb (104.8 kg)   SpO2 96%   BMI 43.50 kg/m    Subjective:    Patient ID: Karen Farley, female    DOB: [birth year], 70 y.o.   MRN: [id number]  HPI: Karen Farley is a 70 y.o. female  Chief Complaint  Patient presents with  . Diabetes  . Hypertension  . Hyperlipidemia   DIABETES Hypoglycemic episodes: yes Polydipsia/polyuria: no Visual disturbance: no Chest pain: no Paresthesias: no Glucose Monitoring: yes  Accucheck frequency: BID Taking Insulin?: no Blood Pressure Monitoring: not checking Retinal Examination: Up to Date Foot Exam: Up to Date Diabetic Education: Completed Pneumovax: Maybe next time Influenza: Refused Aspirin: no  HYPERTENSION / HYPERLIPIDEMIA Satisfied with current treatment? yes Duration of hypertension: chronic BP monitoring frequency: not checking BP medication side effects: no Duration of hyperlipidemia: chronic Cholesterol medication side effects: not on anything Medication compliance: excellent compliance Aspirin: no Recent stressors: no Recurrent headaches: no Visual changes: no Palpitations: no Dyspnea: no Chest pain: no Lower extremity edema: no Dizzy/lightheaded: no  Relevant past medical, surgical, family and social history reviewed and updated as indicated. Interim medical history since our last visit reviewed. Allergies and medications reviewed and updated.  Review of Systems  Constitutional: Negative.   Respiratory: Negative.   Cardiovascular: Positive for leg swelling. Negative for chest pain and palpitations.  Skin: Negative.   Psychiatric/Behavioral: Negative.    Per HPI unless specifically indicated above     Objective:    BP 128/82 (BP Location: Left Arm, Patient Position: Sitting, Cuff Size: Large)   Pulse (!) 103   Temp 97.5 F (36.4 C)   Ht 5'  1.1" (1.552 m)   Wt 231 lb (104.8 kg)   SpO2 96%   BMI 43.50 kg/m   Wt Readings from Last 3 Encounters:  06/28/16 231 lb (104.8 kg)  05/02/16 231 lb (104.8 kg)  03/25/16 231 lb (104.8 kg)    Physical Exam  Constitutional: She is oriented to person, place, and time. She appears well-developed and well-nourished. No distress.  HENT:  Head: Normocephalic and atraumatic.  Right Ear: Hearing normal.  Left Ear: Hearing normal.  Nose: Nose normal.  Eyes: Conjunctivae and lids are normal. Right eye exhibits no discharge. Left eye exhibits no discharge. No scleral icterus.  Cardiovascular: Normal rate, regular rhythm and intact distal pulses.  Exam reveals no gallop and no friction rub.   No murmur heard. Pulmonary/Chest: Effort normal and breath sounds normal. No respiratory distress. She has no wheezes. She has no rales. She exhibits no tenderness.  Musculoskeletal: Normal range of motion.  Neurological: She is alert and oriented to person, place, and time.  Skin: Skin is warm, dry and intact. No rash noted. No erythema. No pallor.  Psychiatric: She has a normal mood and affect. Her speech is normal and behavior is normal. Judgment and thought content normal. Cognition and memory are normal.  Nursing note and vitals reviewed.   Results for orders placed or performed in visit on 06/28/16  Hemoglobin A1c  Result Value Ref Range   Hemoglobin A1C 6.6       Assessment & Plan:   Problem List Items Addressed This Visit      Cardiovascular and Mediastinum   Hypertension    Under good control. Could not pee, sent  home with cup for microalbumin. Recheck at physical.       Relevant Medications   benazepril-hydrochlorthiazide (LOTENSIN HCT) 20-25 MG tablet   Other Relevant Orders   Comprehensive metabolic panel   Microalbumin, Urine Waived     Endocrine   Diabetes mellitus without complication (HCC) - Primary    A1c down to 6.6! Keep up the good work. Recheck at physical.        Relevant Medications   benazepril-hydrochlorthiazide (LOTENSIN HCT) 20-25 MG tablet   metformin (FORTAMET) 1000 MG (OSM) 24 hr tablet   Other Relevant Orders   Bayer DCA Hb A1c Waived   Comprehensive metabolic panel   Microalbumin, Urine Waived     Other   Hyperlipidemia    Improved. Patient refuses statin. Continue to monitor. Continue to work on diet and exercise.       Relevant Medications   benazepril-hydrochlorthiazide (LOTENSIN HCT) 20-25 MG tablet   Other Relevant Orders   Comprehensive metabolic panel   Lipid Panel Piccolo, Waived    Other Visit Diagnoses   None.      Follow up plan: Return After 1/20 , for Medicare wellness.

## 2016-06-28 NOTE — Assessment & Plan Note (Signed)
A1c down to 6.6! Keep up the good work. Recheck at physical.

## 2016-06-28 NOTE — Assessment & Plan Note (Signed)
Under good control. Could not pee, sent home with cup for microalbumin. Recheck at physical.

## 2016-06-29 LAB — COMPREHENSIVE METABOLIC PANEL
ALK PHOS: 60 IU/L (ref 39–117)
ALT: 21 IU/L (ref 0–32)
AST: 13 IU/L (ref 0–40)
Albumin/Globulin Ratio: 2 (ref 1.2–2.2)
Albumin: 4.3 g/dL (ref 3.5–4.8)
BILIRUBIN TOTAL: 0.6 mg/dL (ref 0.0–1.2)
BUN / CREAT RATIO: 21 (ref 12–28)
BUN: 27 mg/dL (ref 8–27)
CHLORIDE: 97 mmol/L (ref 96–106)
CO2: 23 mmol/L (ref 18–29)
Calcium: 9.7 mg/dL (ref 8.7–10.3)
Creatinine, Ser: 1.3 mg/dL — ABNORMAL HIGH (ref 0.57–1.00)
GFR calc Af Amer: 48 mL/min/{1.73_m2} — ABNORMAL LOW (ref >59)
GFR calc non Af Amer: 42 mL/min/{1.73_m2} — ABNORMAL LOW (ref >59)
GLUCOSE: 155 mg/dL — AB (ref 65–99)
Globulin, Total: 2.1 g/dL (ref 1.5–4.5)
POTASSIUM: 4.1 mmol/L (ref 3.5–5.2)
Sodium: 142 mmol/L (ref 134–144)
Total Protein: 6.4 g/dL (ref 6.0–8.5)

## 2016-07-01 ENCOUNTER — Ambulatory Visit: Payer: Medicare Other | Attending: Pain Medicine | Admitting: Pain Medicine

## 2016-07-01 ENCOUNTER — Encounter: Payer: Self-pay | Admitting: Pain Medicine

## 2016-07-01 VITALS — BP 115/56 | HR 87 | Temp 97.9°F | Resp 18 | Ht 61.0 in | Wt 231.0 lb

## 2016-07-01 DIAGNOSIS — M170 Bilateral primary osteoarthritis of knee: Secondary | ICD-10-CM

## 2016-07-01 DIAGNOSIS — D1809 Hemangioma of other sites: Secondary | ICD-10-CM | POA: Insufficient documentation

## 2016-07-01 DIAGNOSIS — M545 Low back pain: Secondary | ICD-10-CM | POA: Diagnosis present

## 2016-07-01 DIAGNOSIS — M79606 Pain in leg, unspecified: Secondary | ICD-10-CM | POA: Insufficient documentation

## 2016-07-01 DIAGNOSIS — M1288 Other specific arthropathies, not elsewhere classified, other specified site: Secondary | ICD-10-CM | POA: Diagnosis not present

## 2016-07-01 DIAGNOSIS — M5136 Other intervertebral disc degeneration, lumbar region: Secondary | ICD-10-CM

## 2016-07-01 DIAGNOSIS — M5124 Other intervertebral disc displacement, thoracic region: Secondary | ICD-10-CM | POA: Insufficient documentation

## 2016-07-01 DIAGNOSIS — M47816 Spondylosis without myelopathy or radiculopathy, lumbar region: Secondary | ICD-10-CM

## 2016-07-01 DIAGNOSIS — G5700 Lesion of sciatic nerve, unspecified lower limb: Secondary | ICD-10-CM | POA: Insufficient documentation

## 2016-07-01 DIAGNOSIS — M533 Sacrococcygeal disorders, not elsewhere classified: Secondary | ICD-10-CM

## 2016-07-01 MED ORDER — BUPIVACAINE HCL (PF) 0.5 % IJ SOLN: 30.0000 mL | Freq: Once | INTRAMUSCULAR | Status: DC

## 2016-07-01 MED ORDER — BUPIVACAINE HCL (PF) 0.25 % IJ SOLN
INTRAMUSCULAR | Status: AC
  Administered 2016-07-01: 14:00:00
  Filled 2016-07-01: qty 30

## 2016-07-01 MED ORDER — MIDAZOLAM HCL 5 MG/5ML IJ SOLN: 5.0000 mg | Freq: Once | INTRAMUSCULAR | Status: DC

## 2016-07-01 MED ORDER — ORPHENADRINE CITRATE 30 MG/ML IJ SOLN: 60.0000 mg | Freq: Once | INTRAMUSCULAR | Status: DC

## 2016-07-01 MED ORDER — TRIAMCINOLONE ACETONIDE 40 MG/ML IJ SUSP: 40.0000 mg | Freq: Once | INTRAMUSCULAR | Status: DC

## 2016-07-01 MED ORDER — ORPHENADRINE CITRATE 30 MG/ML IJ SOLN
INTRAMUSCULAR | Status: AC
  Filled 2016-07-01: qty 2

## 2016-07-01 MED ORDER — TRIAMCINOLONE ACETONIDE 40 MG/ML IJ SUSP
INTRAMUSCULAR | Status: AC
  Administered 2016-07-01: 14:00:00
  Filled 2016-07-01: qty 1

## 2016-07-01 NOTE — Progress Notes (Signed)
Safety precautions to be maintained throughout the outpatient stay will include: orient to surroundings, keep bed in low position, maintain call bell within reach at all times, provide assistance with transfer out of bed and ambulation.  

## 2016-07-01 NOTE — Patient Instructions (Addendum)
PLAN    Continue present medication  F/U PCP Dr.Megan Wynetta Emery  for evaliation of  BP and general medical  condition  F/U surgical evaluation. Follow-up with Dr. Arnoldo Morale as discussed  F/U neurological evaluation. May consider PNCV/EMG studies and other studies pending follow-up evaluations  May consider radiofrequency rhizolysis or intraspinal procedures pending response to present treatment and F/U evaluation   Patient to call [facility] should patient have concerns prior to scheduled return appointmentPain Management Discharge Instructions  General Discharge Instructions :  If you need to reach your doctor call: Monday-Friday 8:00 am - 4:00 pm at [phone] or toll free [phone].  After clinic hours [phone] to have operator reach doctor.  Bring all of your medication bottles to all your appointments in the pain clinic.  To cancel or reschedule your appointment with Pain Management please remember to call 24 hours in advance to avoid a fee.  Refer to the educational materials which you have been given on: General Risks, I had my Procedure. Discharge Instructions, Post Sedation.  Post Procedure Instructions:  The drugs you were given will stay in your system until tomorrow, so for the next 24 hours you should not drive, make any legal decisions or drink any alcoholic beverages.  You may eat anything you prefer, but it is better to start with liquids then soups and crackers, and gradually work up to solid foods.  Please notify your doctor immediately if you have any unusual bleeding, trouble breathing or pain that is not related to your normal pain.  Depending on the type of procedure that was done, some parts of your body may feel week and/or numb.  This usually clears up by tonight or the next day.  Walk with the use of an assistive device or accompanied by an adult for the 24 hours.  You may use ice on the affected area for the first 24 hours.  Put ice in  a Ziploc bag and cover with a towel and place against area 15 minutes on 15 minutes off.  You may switch to heat after 24 hours.

## 2016-07-02 ENCOUNTER — Telehealth: Payer: Self-pay | Admitting: Family Medicine

## 2016-07-02 DIAGNOSIS — N289 Disorder of kidney and ureter, unspecified: Secondary | ICD-10-CM

## 2016-07-02 NOTE — Telephone Encounter (Signed)
Tried patient again. No answer.  Will try again at another time.

## 2016-07-02 NOTE — Telephone Encounter (Signed)
Attempted to call patient, unable to leave a message.   Keri or Tiff- can you try her later today?  Please let her know that her kidney function was up a little bit. This may be due to not drinking enough water. I'd like her to push fluids and we'll recheck in 2 weeks (order's already in). Otherwise everything looked great. Thanks!

## 2016-07-02 NOTE — Progress Notes (Signed)
PROCEDURE PERFORMED: Left Cluneal sciatic nerve block   NOTE: The patient is a 70 y.o. female who returns to [place] for further evaluation and treatment of pain involving the lumbar and lower extremity region with pain occurring in the region of the buttocks and lower extremity of significant degree. Prior studies revealed the patient to be with      EXAM:  MRI LUMBAR SPINE WITHOUT CONTRAST   TECHNIQUE:  Multiplanar, multisequence MR imaging of the lumbar spine was  performed. No intravenous contrast was administered.   COMPARISON: None.   FINDINGS:  Vertebral body height is maintained. Trace anterolisthesis L4 on L5  due to facet arthropathy is noted. 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 The conus medullaris is  normal in signal and position. Imaged intra-abdominal contents are  unremarkable.   The T10-11 and T11-12 levels are imaged in the sagittal plane only  and negative.   T12-L1: Tiny central protrusion without central canal or foraminal  narrowing is identified.   L1-2: Negative.   L2-3: Shallow disc bulge without central canal or foraminal  narrowing.   L3-4: Moderate facet arthropathy is identified. No disc bulge or  protrusion. The central canal and foramina are widely patent.   L4-5: Advanced bilateral facet degenerative change is present. The  disc is slightly uncovered but there is no bulge or protrusion. The  central canal and foramina are widely patent.   L5-S1: Bilateral facet arthropathy is present. Minimal disc bulge  and endplate spur without central canal or foraminal stenosis.   IMPRESSION:  Negative for central canal or foraminal narrowing with mild  degenerative disc disease as described above. Multilevel facet  arthropathy is present and appears worse      The patient is with reproduction of severe pain with palpation of the gluteal and piriformis  musculature region. 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 The risks, benefits, and expectations of the procedure have been discussed and explained to the patient who was understanding and in agreement with suggested treatment plan. We will proceed with interventional treatment as discussed and as explained to the patient who wishes to proceed with proposed treatment.   PROCEDURE #1: Left cluneal nerve block with EKG, blood pressure, pulse, and pulse oximetry monitoring. The procedure was performed with the patient in the lateral decubitus position. Following alcohol prep of proposed entry site and identification of landmarks, a 22 -gauge needle was inserted into the gluteal musculature region and following elicitation of paresthesias radiating to the buttocks, needle was slightly withdrawn and following negative aspiration, a total of 4 mL of 0.25% bupivacaine with Kenalog injected for left  cluneal nerve block. Needle removed. The patient tolerated the injection well.   PROCEDURE #2: Left sciatic nerve block with EKG, blood pressure, pulse, and pulse oximetry monitoring. The procedure was performed with the patient in the lateral decubitus position. Following identification of greater trochanter for establishing point of needle entry and alcohol prep of proposed entry site, a 22 -gauge needle was inserted and following elicitation of paresthesias radiating from buttocks to the foot, needle was slightly withdrawn. Following negative aspiration, a total of 4 mL of 0.25% bupivacaine with Kenalog injected for left sciatic nerve block. Needle removed.  The patient tolerated injection well  A total of 10 mg of Kenalog was utilized for the procedure.   PLAN:   1. Medications: Will continue presently prescribed medications. 2. Will  consider modification of treatment regimen pending response to treatment rendered on today's visit and follow-up  evaluation. 3. The patient is to follow-up with primary care physician Park Liter regarding blood pressure and general medical condition status pose procedure performed on today's visit. 4. Surgical evaluation as discussed. Patient will follow-up with Dr. Radford Pax as discussed 5. Neurological evaluation as discussed. 6. The patient may be a candidate for radiofrequency procedures, Botox injections, implantation type procedures, and other treatment pending response to treatment rendered on today's visit and follow-up evaluation. 7. The patient has been advised to adhere to proper body mechanics and avoid activities which appear to aggravate condition. 8. The patient has been advised to call the Pain Management Center prior to scheduled return appointment should there be significant change in condition or should patient have other concerns regarding condition prior to scheduled return appointment.  The patient is understanding and in agreement with suggested treatment plan.

## 2016-07-03 NOTE — Telephone Encounter (Signed)
Called patient, no answer, unable to leave message will try again.

## 2016-07-04 ENCOUNTER — Encounter: Payer: Self-pay | Admitting: Family Medicine

## 2016-07-04 NOTE — Telephone Encounter (Signed)
Letter generated

## 2016-07-04 NOTE — Telephone Encounter (Signed)
Dr.Johnson, we have tried to call this patient several different times without any return call. What would you like to do?

## 2016-07-09 ENCOUNTER — Telehealth: Payer: Self-pay | Admitting: Family Medicine

## 2016-07-09 NOTE — Telephone Encounter (Signed)
Pt would like a call back regarding lab results. 

## 2016-07-09 NOTE — Telephone Encounter (Signed)
Spoke with patient, notified her of her lab results

## 2016-07-30 ENCOUNTER — Other Ambulatory Visit: Payer: Self-pay | Admitting: Pain Medicine

## 2016-08-01 ENCOUNTER — Other Ambulatory Visit: Payer: Medicare Other

## 2016-08-01 DIAGNOSIS — N289 Disorder of kidney and ureter, unspecified: Secondary | ICD-10-CM

## 2016-08-01 DIAGNOSIS — I10 Essential (primary) hypertension: Secondary | ICD-10-CM

## 2016-08-01 DIAGNOSIS — E119 Type 2 diabetes mellitus without complications: Secondary | ICD-10-CM

## 2016-08-02 ENCOUNTER — Encounter: Payer: Self-pay | Admitting: Family Medicine

## 2016-08-02 ENCOUNTER — Telehealth: Payer: Self-pay

## 2016-08-02 LAB — BASIC METABOLIC PANEL
BUN/Creatinine Ratio: 17 (ref 12–28)
BUN: 20 mg/dL (ref 8–27)
CALCIUM: 10.1 mg/dL (ref 8.7–10.3)
CO2: 24 mmol/L (ref 18–29)
Chloride: 100 mmol/L (ref 96–106)
Creatinine, Ser: 1.19 mg/dL — ABNORMAL HIGH (ref 0.57–1.00)
GFR, EST AFRICAN AMERICAN: 53 mL/min/{1.73_m2} — AB (ref >59)
GFR, EST NON AFRICAN AMERICAN: 46 mL/min/{1.73_m2} — AB (ref >59)
Glucose: 145 mg/dL — ABNORMAL HIGH (ref 65–99)
Potassium: 4.9 mmol/L (ref 3.5–5.2)
Sodium: 143 mmol/L (ref 134–144)

## 2016-08-02 LAB — MICROALBUMIN, URINE WAIVED
Creatinine, Urine Waived: 200 mg/dL (ref 10–300)
MICROALB, UR WAIVED: 30 mg/L — AB (ref 0–19)
Microalb/Creat Ratio: <30 mg/g (ref <30)

## 2016-08-02 NOTE — Telephone Encounter (Signed)
-----   Message from Valerie Roys, DO sent at 08/02/2016  4:25 PM EDT ----- Would you please print the letter in her chart and send it to her? Thank you!!

## 2016-08-02 NOTE — Telephone Encounter (Signed)
Letter printed and sent.  

## 2016-08-28 ENCOUNTER — Other Ambulatory Visit: Payer: Self-pay | Admitting: Pain Medicine

## 2016-09-11 ENCOUNTER — Other Ambulatory Visit: Payer: Self-pay | Admitting: Family Medicine

## 2016-11-18 ENCOUNTER — Ambulatory Visit: Payer: Medicare Other | Admitting: Family Medicine

## 2016-11-26 ENCOUNTER — Other Ambulatory Visit: Payer: Self-pay | Admitting: Family Medicine

## 2016-12-13 ENCOUNTER — Ambulatory Visit: Payer: Medicare Other | Admitting: Family Medicine

## 2016-12-23 ENCOUNTER — Other Ambulatory Visit: Payer: Self-pay | Admitting: Family Medicine

## 2016-12-24 ENCOUNTER — Encounter: Payer: Self-pay | Admitting: Family Medicine

## 2016-12-24 ENCOUNTER — Ambulatory Visit (INDEPENDENT_AMBULATORY_CARE_PROVIDER_SITE_OTHER): Payer: Medicare Other | Admitting: Family Medicine

## 2016-12-24 VITALS — BP 106/72 | HR 111 | Temp 97.2°F | Resp 17 | Ht 62.5 in | Wt 234.0 lb

## 2016-12-24 DIAGNOSIS — Z1231 Encounter for screening mammogram for malignant neoplasm of breast: Secondary | ICD-10-CM | POA: Diagnosis not present

## 2016-12-24 DIAGNOSIS — E119 Type 2 diabetes mellitus without complications: Secondary | ICD-10-CM | POA: Diagnosis not present

## 2016-12-24 DIAGNOSIS — E1122 Type 2 diabetes mellitus with diabetic chronic kidney disease: Secondary | ICD-10-CM

## 2016-12-24 DIAGNOSIS — E782 Mixed hyperlipidemia: Secondary | ICD-10-CM | POA: Diagnosis not present

## 2016-12-24 DIAGNOSIS — Z0000 Encounter for general adult medical examination without abnormal findings: Secondary | ICD-10-CM

## 2016-12-24 DIAGNOSIS — M170 Bilateral primary osteoarthritis of knee: Secondary | ICD-10-CM | POA: Diagnosis not present

## 2016-12-24 DIAGNOSIS — Z1211 Encounter for screening for malignant neoplasm of colon: Secondary | ICD-10-CM | POA: Diagnosis not present

## 2016-12-24 DIAGNOSIS — M8580 Other specified disorders of bone density and structure, unspecified site: Secondary | ICD-10-CM | POA: Diagnosis not present

## 2016-12-24 DIAGNOSIS — R946 Abnormal results of thyroid function studies: Secondary | ICD-10-CM | POA: Diagnosis not present

## 2016-12-24 DIAGNOSIS — R7989 Other specified abnormal findings of blood chemistry: Secondary | ICD-10-CM

## 2016-12-24 DIAGNOSIS — Z1239 Encounter for other screening for malignant neoplasm of breast: Secondary | ICD-10-CM

## 2016-12-24 DIAGNOSIS — I10 Essential (primary) hypertension: Secondary | ICD-10-CM | POA: Diagnosis not present

## 2016-12-24 DIAGNOSIS — N182 Chronic kidney disease, stage 2 (mild): Secondary | ICD-10-CM

## 2016-12-24 LAB — BAYER DCA HB A1C WAIVED: HB A1C: 7.9 % — AB (ref <7.0)

## 2016-12-24 MED ORDER — BENAZEPRIL-HYDROCHLOROTHIAZIDE 20-25 MG PO TABS: 1.0000 | ORAL_TABLET | Freq: Every day | ORAL | 1 refills | Status: DC

## 2016-12-24 MED ORDER — METFORMIN HCL ER 500 MG PO TB24: 1000.0000 mg | ORAL_TABLET | Freq: Every day | ORAL | 2 refills | Status: DC

## 2016-12-24 NOTE — Assessment & Plan Note (Signed)
Rechecking labs. Await results. Call with any concerns.  °

## 2016-12-24 NOTE — Progress Notes (Signed)
BP 106/72 (BP Location: Left Arm, Patient Position: Sitting, Cuff Size: Large)   Pulse (!) 111   Temp 97.2 F (36.2 C) (Oral)   Resp 17   Ht 5' 2.5" (1.588 m)   Wt 234 lb (106.1 kg)   SpO2 96%   BMI 42.12 kg/m    Subjective:    Patient ID: Karen Farley, female    DOB: [birth year], 71 y.o.   MRN: [id number]  HPI: Karen Farley is a 71 y.o. female presenting on 12/24/2016 for comprehensive medical examination. Current medical complaints include:  DIABETES- has been having trouble getting her sugar under control since her steroid injection. This time it was much worse than usual. Finds that her sugar is taking longer to come down to where it's supposed to  Hypoglycemic episodes:no Polydipsia/polyuria: no Visual disturbance: no Chest pain: no Paresthesias: yes- from steroid injection Glucose Monitoring: yes- daily Taking Insulin?: no Blood Pressure Monitoring: not checking Retinal Examination: Up to Date Foot Exam: Up to Date Diabetic Education: Completed Pneumovax: Up to Date Influenza: Declined Aspirin: no  HYPERTENSION / HYPERLIPIDEMIA Satisfied with current treatment? yes Duration of hypertension: chronic BP monitoring frequency: not checking BP medication side effects: no Past BP meds: benazepril-hctz Duration of hyperlipidemia: chronic Cholesterol medication side effects: Not on anything Cholesterol supplements: none Past cholesterol medications: none Medication compliance: excellent compliance Aspirin: no Recent stressors: no Recurrent headaches: no Visual changes: no Palpitations: no Dyspnea: no Chest pain: no Lower extremity edema: no Dizzy/lightheaded: no  Menopausal Symptoms: no  Functional Status Survey: Is the patient deaf or have difficulty hearing?: No Does the patient have difficulty seeing, even when wearing glasses/contacts?: No Does the patient have difficulty concentrating, remembering, or making decisions?: No Does the patient  have difficulty walking or climbing stairs?: Yes (Trouble with stairs/ also lower back problems) Does the patient have difficulty dressing or bathing?: No Does the patient have difficulty doing errands alone such as visiting a doctor's office or shopping?: No  Fall Risk  12/24/2016 05/02/2016 03/25/2016 03/13/2016 02/21/2016  Falls in the past year? No No No No No    Depression Screen Depression screen [facility] 2/9 12/24/2016 05/02/2016 03/13/2016 01/17/2016 01/09/2016  Decreased Interest 0 0 0 0 0  Down, Depressed, Hopeless 0 0 0 0 0  PHQ - 2 Score 0 0 0 0 0    Advanced Directives Does patient have a HCPOA?    yes Does patient have a living will or MOST form?  yes  Past Medical History:  Past Medical History:  Diagnosis Date  . 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 DDD (degenerative disc disease), lumbar   . Degenerative disc disease, lumbar    bulging and dengerated  . Diabetes mellitus without complication ([place])   . Hyperlipidemia   . Hypertension   . Osteoarthritis of both knees   . Osteopenia     Surgical History:  Past Surgical History:  Procedure Laterality Date  . CHOLECYSTECTOMY    . DILATION AND CURETTAGE OF UTERUS    . TEAR DUCT PROBING WITH STRABISMUS REPAIR Right   . TONSILLECTOMY      Medications:  Current Outpatient Prescriptions on File Prior to Visit  Medication Sig  . Azelaic Acid 15 % cream Apply topically 2 (two) times daily. Reported on 01/09/2016  . Elastic Bandages & Supports (MEDICAL COMPRESSION STOCKINGS) MISC by Does not apply route.  . latanoprost (XALATAN) 0.005 % ophthalmic solution 1 drop at bedtime.  Marland Kitchen  Multiple Vitamin (MULTIVITAMIN WITH MINERALS) TABS tablet Take 1 tablet by mouth daily.  . ONE TOUCH ULTRA TEST test strip TEST once daily   Current Facility-Administered Medications on File Prior to Visit  Medication  . bupivacaine (MARCAINE) 0.5 % injection 30 mL  . bupivacaine (PF) (MARCAINE) 0.25 % injection 30 mL  . bupivacaine (PF)  (MARCAINE) 0.25 % injection 30 mL  . ceFAZolin (ANCEF) IVPB 1 g/50 mL premix  . ceFAZolin (ANCEF) IVPB 1 g/50 mL premix  . fentaNYL (SUBLIMAZE) injection 100 mcg  . fentaNYL (SUBLIMAZE) injection 100 mcg  . lactated ringers infusion 1,000 mL  . lactated ringers infusion 1,000 mL  . lactated ringers infusion 1,000 mL  . lidocaine (PF) (XYLOCAINE) 1 % injection 10 mL  . midazolam (VERSED) 5 MG/5ML injection 5 mg  . midazolam (VERSED) 5 MG/5ML injection 5 mg  . midazolam (VERSED) 5 MG/5ML injection 5 mg  . orphenadrine (NORFLEX) injection 60 mg  . orphenadrine (NORFLEX) injection 60 mg  . orphenadrine (NORFLEX) injection 60 mg  . sodium chloride flush (NS) 0.9 % injection 20 mL  . triamcinolone acetonide (KENALOG-40) injection 40 mg  . triamcinolone acetonide (KENALOG-40) injection 40 mg  . triamcinolone acetonide (KENALOG-40) injection 40 mg    Allergies:  No Known Allergies  Social History:  Social History   Social History  . Marital status: Married    Spouse name: N/A  . Number of children: N/A  . Years of education: N/A   Occupational History  . Not on file.   Social History Main Topics  . Smoking status: Never Smoker  . Smokeless tobacco: Never Used  . Alcohol use 0.0 oz/week     Comment: on rare occasion  . Drug use: No  . Sexual activity: Not Currently   Other Topics Concern  . Not on file   Social History Narrative  . No narrative on file   History  Smoking Status  . Never Smoker  Smokeless Tobacco  . Never Used   History  Alcohol Use  . 0.0 oz/week    Comment: on rare occasion    Family History:  Family History  Problem Relation Age of Onset  . Heart disease Mother     CHF  . Cancer Mother     Breast  . Osteoporosis Mother   . Heart disease Father 74  . Cancer Brother     Colon CA- 2002- Youngest brother  . Parkinson's disease Brother     Younger Brother  . Heart disease Brother     2 brothers  . Cancer Maternal Grandmother      Breast  . Breast cancer Neg Hx     Past medical history, surgical history, medications, allergies, family history and social history reviewed with patient today and changes made to appropriate areas of the chart.   Review of Systems  Constitutional: Negative.   HENT: Negative.   Eyes: Negative.   Respiratory: Positive for cough. Negative for hemoptysis, sputum production, shortness of breath and wheezing.   Cardiovascular: Positive for leg swelling. Negative for chest pain, palpitations, orthopnea, claudication and PND.  Gastrointestinal: Negative.   Genitourinary: Negative.   Musculoskeletal: Positive for back pain and joint pain. Negative for falls, myalgias and neck pain.  Neurological: Negative.   Endo/Heme/Allergies: Negative.   Psychiatric/Behavioral: Negative.     All other ROS negative except what is listed above and in the HPI.      Objective:    BP 106/72 (BP Location: Left Arm,  Patient Position: Sitting, Cuff Size: Large)   Pulse (!) 111   Temp 97.2 F (36.2 C) (Oral)   Resp 17   Ht 5' 2.5" (1.588 m)   Wt 234 lb (106.1 kg)   SpO2 96%   BMI 42.12 kg/m   Wt Readings from Last 3 Encounters:  12/24/16 234 lb (106.1 kg)  07/01/16 231 lb (104.8 kg)  06/28/16 231 lb (104.8 kg)     Hearing Screening   125Hz  250Hz  500Hz  1000Hz  2000Hz  3000Hz  4000Hz  6000Hz  8000Hz   Right ear:   40 40 40  40    Left ear:   40  25  25      Visual Acuity Screening   Right eye Left eye Both eyes  Without correction:     With correction: 20/25 20/25 4020/25    Physical Exam  Constitutional: She is oriented to person, place, and time. She appears well-developed and well-nourished. No distress.  HENT:  Head: Normocephalic and atraumatic.  Right Ear: Hearing, tympanic membrane, external ear and ear canal normal.  Left Ear: Hearing, tympanic membrane, external ear and ear canal normal.  Nose: Nose normal.  Mouth/Throat: Uvula is midline, oropharynx is clear and moist and mucous  membranes are normal. No oropharyngeal exudate.  Eyes: Conjunctivae, EOM and lids are normal. Pupils are equal, round, and reactive to light. Right eye exhibits no discharge. Left eye exhibits no discharge. No scleral icterus.  Neck: Normal range of motion. Neck supple. No JVD present. No tracheal deviation present. No thyromegaly present.  Cardiovascular: Normal rate, regular rhythm, normal heart sounds and intact distal pulses.  Exam reveals no gallop and no friction rub.   No murmur heard. Pulmonary/Chest: Effort normal and breath sounds normal. No stridor. No respiratory distress. She has no wheezes. She has no rales. She exhibits no tenderness.  Abdominal: Soft. Bowel sounds are normal. She exhibits no distension and no mass. There is no tenderness. There is no rebound and no guarding.  Genitourinary:  Genitourinary Comments: Breast and pelvic exams deferred with shared decision making  Musculoskeletal: Normal range of motion. She exhibits no edema, tenderness or deformity.  Lymphadenopathy:    She has no cervical adenopathy.  Neurological: She is alert and oriented to person, place, and time. She has normal reflexes. She displays normal reflexes. No cranial nerve deficit. She exhibits normal muscle tone. Coordination normal.  Skin: Skin is warm, dry and intact. No rash noted. She is not diaphoretic. No erythema. No pallor.  Psychiatric: She has a normal mood and affect. Her speech is normal and behavior is normal. Judgment and thought content normal. Cognition and memory are normal.  Nursing note and vitals reviewed.   6CIT Screen 12/24/2016  What Year? 0 points  What month? 0 points  What time? 0 points  Count back from 20 0 points  Months in reverse 2 points  Repeat phrase 0 points  Total Score 2     Results for orders placed or performed in visit on 12/24/16  Bayer DCA Hb A1c Waived  Result Value Ref Range   Bayer DCA Hb A1c Waived 7.9 (H) <7.0 %      Assessment & Plan:    Problem List Items Addressed This Visit      Cardiovascular and Mediastinum   Hypertension    Under good control. Continue current regimen. Continue to monitor. Call with any concerns.       Relevant Medications   benazepril-hydrochlorthiazide (LOTENSIN HCT) 20-25 MG tablet   Other Relevant  Orders   CBC with Differential/Platelet   Comprehensive metabolic panel   Microalbumin, Urine Waived   UA/M w/rflx Culture, Routine     Endocrine   Diabetes mellitus without complication (HCC)    L4Y up to 7.9. Really work on diet and recheck in 3 months.       Relevant Medications   benazepril-hydrochlorthiazide (LOTENSIN HCT) 20-25 MG tablet   metFORMIN (GLUCOPHAGE XR) 500 MG 24 hr tablet   Other Relevant Orders   Bayer DCA Hb A1c Waived (Completed)   CBC with Differential/Platelet   Comprehensive metabolic panel   Microalbumin, Urine Waived   UA/M w/rflx Culture, Routine   CKD stage 2 due to type 2 diabetes mellitus (HCC)    Continue BP and DM control. Call with any concerns.       Relevant Medications   benazepril-hydrochlorthiazide (LOTENSIN HCT) 20-25 MG tablet   metFORMIN (GLUCOPHAGE XR) 500 MG 24 hr tablet   Other Relevant Orders   CBC with Differential/Platelet   Comprehensive metabolic panel   Microalbumin, Urine Waived   UA/M w/rflx Culture, Routine     Musculoskeletal and Integument   Osteoarthritis of both knees    Continue to work on weight loss. Call with any concerns.       Osteopenia    Rechecking labs. Await results. Call with any concerns.       Relevant Orders   CBC with Differential/Platelet   Comprehensive metabolic panel   UA/M w/rflx Culture, Routine   VITAMIN D 25 Hydroxy (Vit-D Deficiency, Fractures)     Other   Hyperlipidemia    Rechecking levels today. Await results. Call with any concerns.       Relevant Medications   benazepril-hydrochlorthiazide (LOTENSIN HCT) 20-25 MG tablet   Other Relevant Orders   CBC with Differential/Platelet    Comprehensive metabolic panel   Lipid Panel w/o Chol/HDL Ratio   UA/M w/rflx Culture, Routine   Abnormal thyroid blood test    Rechecking levels today. Await results. Call with any concerns.       Relevant Orders   CBC with Differential/Platelet   Comprehensive metabolic panel   TSH   UA/M w/rflx Culture, Routine    Other Visit Diagnoses    Medicare annual wellness visit, subsequent    -  Primary   Preventative care discussed as below.    Screening for breast cancer       Mammogram ordered.    Relevant Orders   MM DIGITAL SCREENING BILATERAL   Screening for colon cancer       Has stool cards at home. Will bring them back       Preventative Services:  Health Risk Assessment and Personalized Prevention Plan: Done today Bone Mass Measurements: done last year- due in 4 Breast Cancer Screening: Ordered today CVD Screening: Done today Cervical Cancer Screening: N/A Colon Cancer Screening:  Depression Screening: Done today Diabetes Screening: Done today Glaucoma Screening: See your eye doctor Hepatitis B vaccine: N/A Hepatitis C screening: Declined HIV Screening: Declined Flu Vaccine: Post-pone to flu season Lung cancer Screening: N/A Obesity Screening: Done today Pneumonia Vaccines (2): declined- will do it next time STI Screening: N/A  Follow up plan: Return in about 3 months (around 03/26/2017) for DM.   LABORATORY TESTING:  - Pap smear: not applicable  IMMUNIZATIONS:   - Tdap: Tetanus vaccination status reviewed: last tetanus booster within 10 years. - Influenza: Refused - Pneumovax: Up to date - Prevnar: Refused - Zostavax vaccine: Refused  SCREENING: -Mammogram: Ordered  today  - Colonoscopy: Refused  - Bone Density: Up to date  -Hearing Test: Ordered today  -Spirometry: Not applicable   PATIENT COUNSELING:   Advised to take 1 mg of folate supplement per day if capable of pregnancy.   Sexuality: Discussed sexually transmitted diseases, partner  selection, use of condoms, avoidance of unintended pregnancy  and contraceptive alternatives.   Advised to avoid cigarette smoking.  I discussed with the patient that most people either abstain from alcohol or drink within safe limits (<=14/week and <=4 drinks/occasion for males, <=7/weeks and <= 3 drinks/occasion for females) and that the risk for alcohol disorders and other health effects rises proportionally with the number of drinks per week and how often a drinker exceeds daily limits.  Discussed cessation/primary prevention of drug use and availability of treatment for abuse.   Diet: Encouraged to adjust caloric intake to maintain  or achieve ideal body weight, to reduce intake of dietary saturated fat and total fat, to limit sodium intake by avoiding high sodium foods and not adding table salt, and to maintain adequate dietary potassium and calcium preferably from fresh fruits, vegetables, and low-fat dairy products.    stressed the importance of regular exercise  Injury prevention: Discussed safety belts, safety helmets, smoke detector, smoking near bedding or upholstery.   Dental health: Discussed importance of regular tooth brushing, flossing, and dental visits.    NEXT PREVENTATIVE PHYSICAL DUE IN 1 YEAR. Return in about 3 months (around 03/26/2017) for DM.

## 2016-12-24 NOTE — Assessment & Plan Note (Signed)
Continue BP and DM control. Call with any concerns.

## 2016-12-24 NOTE — Assessment & Plan Note (Signed)
Continue to work on weight loss. Call with any concerns.

## 2016-12-24 NOTE — Assessment & Plan Note (Signed)
Rechecking levels today. Await results. Call with any concerns.  

## 2016-12-24 NOTE — Assessment & Plan Note (Signed)
Under good control. Continue current regimen. Continue to monitor. Call with any concerns. 

## 2016-12-24 NOTE — Assessment & Plan Note (Signed)
A1c up to 7.9. Really work on diet and recheck in 3 months.

## 2016-12-24 NOTE — Patient Instructions (Addendum)
Preventative Services:  Health Risk Assessment and Personalized Prevention Plan: Done today Bone Mass Measurements: done last year- due in 4 Breast Cancer Screening: Ordered today CVD Screening: Done today Cervical Cancer Screening: N/A Colon Cancer Screening:  Depression Screening: Done today Diabetes Screening: Done today Glaucoma Screening: See your eye doctor Hepatitis B vaccine: N/A Hepatitis C screening: Declined HIV Screening: Declined Flu Vaccine: Post-pone to flu season Lung cancer Screening: N/A Obesity Screening: Done today Pneumonia Vaccines (2): declined- will do it next time STI Screening: N/A   Health Maintenance, Female Adopting a healthy lifestyle and getting preventive care can go a long way to promote health and wellness. Talk with your health care provider about what schedule of regular examinations is right for you. This is a good chance for you to check in with your provider about disease prevention and staying healthy. In between checkups, there are plenty of things you can do on your own. Experts have done a lot of research about which lifestyle changes and preventive measures are most likely to keep you healthy. Ask your health care provider for more information. Weight and diet Eat a healthy diet  Be sure to include plenty of vegetables, fruits, low-fat dairy products, and lean protein.  Do not eat a lot of foods high in solid fats, added sugars, or salt.  Get regular exercise. This is one of the most important things you can do for your health.  Most adults should exercise for at least 150 minutes each week. The exercise should increase your heart rate and make you sweat (moderate-intensity exercise).  Most adults should also do strengthening exercises at least twice a week. This is in addition to the moderate-intensity exercise. Maintain a healthy weight  Body mass index (BMI) is a measurement that can be used to identify possible weight problems. It  estimates body fat based on height and weight. Your health care provider can help determine your BMI and help you achieve or maintain a healthy weight.  For females 66 years of age and older:  A BMI below 18.5 is considered underweight.  A BMI of 18.5 to 24.9 is normal.  A BMI of 25 to 29.9 is considered overweight.  A BMI of 30 and above is considered obese. Watch levels of cholesterol and blood lipids  You should start having your blood tested for lipids and cholesterol at 71 years of age, then have this test every 5 years.  You may need to have your cholesterol levels checked more often if:  Your lipid or cholesterol levels are high.  You are older than 71 years of age.  You are at high risk for heart disease. Cancer screening Lung Cancer  Lung cancer screening is recommended for adults 95-19 years old who are at high risk for lung cancer because of a history of smoking.  A yearly low-dose CT scan of the lungs is recommended for people who:  Currently smoke.  Have quit within the past 15 years.  Have at least a 30-pack-year history of smoking. A pack year is smoking an average of one pack of cigarettes a day for 1 year.  Yearly screening should continue until it has been 15 years since you quit.  Yearly screening should stop if you develop a health problem that would prevent you from having lung cancer treatment. Breast Cancer  Practice breast self-awareness. This means understanding how your breasts normally appear and feel.  It also means doing regular breast self-exams. Let your health care provider  know about any changes, no matter how small.  If you are in your 20s or 30s, you should have a clinical breast exam (CBE) by a health care provider every 1-3 years as part of a regular health exam.  If you are 24 or older, have a CBE every year. Also consider having a breast X-ray (mammogram) every year.  If you have a family history of breast cancer, talk to your  health care provider about genetic screening.  If you are at high risk for breast cancer, talk to your health care provider about having an MRI and a mammogram every year.  Breast cancer gene (BRCA) assessment is recommended for women who have family members with BRCA-related cancers. BRCA-related cancers include:  Breast.  Ovarian.  Tubal.  Peritoneal cancers.  Results of the assessment will determine the need for genetic counseling and BRCA1 and BRCA2 testing. Cervical Cancer  Your health care provider may recommend that you be screened regularly for cancer of the pelvic organs (ovaries, uterus, and vagina). This screening involves a pelvic examination, including checking for microscopic changes to the surface of your cervix (Pap test). You may be encouraged to have this screening done every 3 years, beginning at age 23.  For women ages 64-65, health care providers may recommend pelvic exams and Pap testing every 3 years, or they may recommend the Pap and pelvic exam, combined with testing for human papilloma virus (HPV), every 5 years. Some types of HPV increase your risk of cervical cancer. Testing for HPV may also be done on women of any age with unclear Pap test results.  Other health care providers may not recommend any screening for nonpregnant women who are considered low risk for pelvic cancer and who do not have symptoms. Ask your health care provider if a screening pelvic exam is right for you.  If you have had past treatment for cervical cancer or a condition that could lead to cancer, you need Pap tests and screening for cancer for at least 20 years after your treatment. If Pap tests have been discontinued, your risk factors (such as having a new sexual partner) need to be reassessed to determine if screening should resume. Some women have medical problems that increase the chance of getting cervical cancer. In these cases, your health care provider may recommend more frequent  screening and Pap tests. Colorectal Cancer  This type of cancer can be detected and often prevented.  Routine colorectal cancer screening usually begins at 71 years of age and continues through 71 years of age.  Your health care provider may recommend screening at an earlier age if you have risk factors for colon cancer.  Your health care provider may also recommend using home test kits to check for hidden blood in the stool.  A small camera at the end of a tube can be used to examine your colon directly (sigmoidoscopy or colonoscopy). This is done to check for the earliest forms of colorectal cancer.  Routine screening usually begins at age 43.  Direct examination of the colon should be repeated every 5-10 years through 71 years of age. However, you may need to be screened more often if early forms of precancerous polyps or small growths are found. Skin Cancer  Check your skin from head to toe regularly.  Tell your health care provider about any new moles or changes in moles, especially if there is a change in a mole's shape or color.  Also tell your health care provider  if you have a mole that is larger than the size of a pencil eraser.  Always use sunscreen. Apply sunscreen liberally and repeatedly throughout the day.  Protect yourself by wearing long sleeves, pants, a wide-brimmed hat, and sunglasses whenever you are outside. Heart disease, diabetes, and high blood pressure  High blood pressure causes heart disease and increases the risk of stroke. High blood pressure is more likely to develop in:  People who have blood pressure in the high end of the normal range (130-139/85-89 mm Hg).  People who are overweight or obese.  People who are African American.  If you are 63-71 years of age, have your blood pressure checked every 3-5 years. If you are 29 years of age or older, have your blood pressure checked every year. You should have your blood pressure measured twice-once  when you are at a hospital or clinic, and once when you are not at a hospital or clinic. Record the average of the two measurements. To check your blood pressure when you are not at a hospital or clinic, you can use:  An automated blood pressure machine at a pharmacy.  A home blood pressure monitor.  If you are between 53 years and 79 years old, ask your health care provider if you should take aspirin to prevent strokes.  Have regular diabetes screenings. This involves taking a blood sample to check your fasting blood sugar level.  If you are at a normal weight and have a low risk for diabetes, have this test once every three years after 71 years of age.  If you are overweight and have a high risk for diabetes, consider being tested at a younger age or more often. Preventing infection Hepatitis B  If you have a higher risk for hepatitis B, you should be screened for this virus. You are considered at high risk for hepatitis B if:  You were born in a country where hepatitis B is common. Ask your health care provider which countries are considered high risk.  Your parents were born in a high-risk country, and you have not been immunized against hepatitis B (hepatitis B vaccine).  You have HIV or AIDS.  You use needles to inject street drugs.  You live with someone who has hepatitis B.  You have had sex with someone who has hepatitis B.  You get hemodialysis treatment.  You take certain medicines for conditions, including cancer, organ transplantation, and autoimmune conditions. Hepatitis C  Blood testing is recommended for:  Everyone born from [birth year] through 1965.  Anyone with known risk factors for hepatitis C. Sexually transmitted infections (STIs)  You should be screened for sexually transmitted infections (STIs) including gonorrhea and chlamydia if:  You are sexually active and are younger than 71 years of age.  You are older than 71 years of age and your health care  provider tells you that you are at risk for this type of infection.  Your sexual activity has changed since you were last screened and you are at an increased risk for chlamydia or gonorrhea. Ask your health care provider if you are at risk.  If you do not have HIV, but are at risk, it may be recommended that you take a prescription medicine daily to prevent HIV infection. This is called pre-exposure prophylaxis (PrEP). You are considered at risk if:  You are sexually active and do not regularly use condoms or know the HIV status of your partner(s).  You take drugs by injection.  You are sexually active with a partner who has HIV. Talk with your health care provider about whether you are at high risk of being infected with HIV. If you choose to begin PrEP, you should first be tested for HIV. You should then be tested every 3 months for as long as you are taking PrEP. Pregnancy  If you are premenopausal and you may become pregnant, ask your health care provider about preconception counseling.  If you may become pregnant, take 400 to 800 micrograms (mcg) of folic acid every day.  If you want to prevent pregnancy, talk to your health care provider about birth control (contraception). Osteoporosis and menopause  Osteoporosis is a disease in which the bones lose minerals and strength with aging. This can result in serious bone fractures. Your risk for osteoporosis can be identified using a bone density scan.  If you are 52 years of age or older, or if you are at risk for osteoporosis and fractures, ask your health care provider if you should be screened.  Ask your health care provider whether you should take a calcium or vitamin D supplement to lower your risk for osteoporosis.  Menopause may have certain physical symptoms and risks.  Hormone replacement therapy may reduce some of these symptoms and risks. Talk to your health care provider about whether hormone replacement therapy is right  for you. Follow these instructions at home:  Schedule regular health, dental, and eye exams.  Stay current with your immunizations.  Do not use any tobacco products including cigarettes, chewing tobacco, or electronic cigarettes.  If you are pregnant, do not drink alcohol.  If you are breastfeeding, limit how much and how often you drink alcohol.  Limit alcohol intake to no more than 1 drink per day for nonpregnant women. One drink equals 12 ounces of beer, 5 ounces of wine, or 1 ounces of hard liquor.  Do not use street drugs.  Do not share needles.  Ask your health care provider for help if you need support or information about quitting drugs.  Tell your health care provider if you often feel depressed.  Tell your health care provider if you have ever been abused or do not feel safe at home. This information is not intended to replace advice given to you by your health care provider. Make sure you discuss any questions you have with your health care provider. Document Released: 04/22/2011 Document Revised: 03/14/2016 Document Reviewed: 07/11/2015 Elsevier Interactive Patient Education  2017 [place] Maintenance for Postmenopausal Women Menopause is a normal process in which your reproductive ability comes to an end. This process happens gradually over a span of months to years, usually between the ages of 30 and 65. Menopause is complete when you have missed 12 consecutive menstrual periods. It is important to talk with your health care provider about some of the most common conditions that affect postmenopausal women, such as heart disease, cancer, and bone loss (osteoporosis). Adopting a healthy lifestyle and getting preventive care can help to promote your health and wellness. Those actions can also lower your chances of developing some of these common conditions. What should I know about menopause? During menopause, you may experience a number of symptoms, such  as:  Moderate-to-severe hot flashes.  Night sweats.  Decrease in sex drive.  Mood swings.  Headaches.  Tiredness.  Irritability.  Memory problems.  Insomnia. Choosing to treat or not to treat menopausal changes is an individual decision that you make with your  health care provider. What should I know about hormone replacement therapy and supplements? Hormone therapy products are effective for treating symptoms that are associated with menopause, such as hot flashes and night sweats. Hormone replacement carries certain risks, especially as you become older. If you are thinking about using estrogen or estrogen with progestin treatments, discuss the benefits and risks with your health care provider. What should I know about heart disease and stroke? Heart disease, heart attack, and stroke become more likely as you age. This may be due, in part, to the hormonal changes that your body experiences during menopause. These can affect how your body processes dietary fats, triglycerides, and cholesterol. Heart attack and stroke are both medical emergencies. There are many things that you can do to help prevent heart disease and stroke:  Have your blood pressure checked at least every 1-2 years. High blood pressure causes heart disease and increases the risk of stroke.  If you are 24-29 years old, ask your health care provider if you should take aspirin to prevent a heart attack or a stroke.  Do not use any tobacco products, including cigarettes, chewing tobacco, or electronic cigarettes. If you need help quitting, ask your health care provider.  It is important to eat a healthy diet and maintain a healthy weight.  Be sure to include plenty of vegetables, fruits, low-fat dairy products, and lean protein.  Avoid eating foods that are high in solid fats, added sugars, or salt (sodium).  Get regular exercise. This is one of the most important things that you can do for your health.  Try to  exercise for at least 150 minutes each week. The type of exercise that you do should increase your heart rate and make you sweat. This is known as moderate-intensity exercise.  Try to do strengthening exercises at least twice each week. Do these in addition to the moderate-intensity exercise.  Know your numbers.Ask your health care provider to check your cholesterol and your blood glucose. Continue to have your blood tested as directed by your health care provider. What should I know about cancer screening? There are several types of cancer. Take the following steps to reduce your risk and to catch any cancer development as early as possible. Breast Cancer  Practice breast self-awareness.  This means understanding how your breasts normally appear and feel.  It also means doing regular breast self-exams. Let your health care provider know about any changes, no matter how small.  If you are 42 or older, have a clinician do a breast exam (clinical breast exam or CBE) every year. Depending on your age, family history, and medical history, it may be recommended that you also have a yearly breast X-ray (mammogram).  If you have a family history of breast cancer, talk with your health care provider about genetic screening.  If you are at high risk for breast cancer, talk with your health care provider about having an MRI and a mammogram every year.  Breast cancer (BRCA) gene test is recommended for women who have family members with BRCA-related cancers. Results of the assessment will determine the need for genetic counseling and BRCA1 and for BRCA2 testing. BRCA-related cancers include these types:  Breast. This occurs in males or females.  Ovarian.  Tubal. This may also be called fallopian tube cancer.  Cancer of the abdominal or pelvic lining (peritoneal cancer).  Prostate.  Pancreatic. Cervical, Uterine, and Ovarian Cancer  Your health care provider may recommend that you be screened  regularly for cancer of the pelvic organs. These include your ovaries, uterus, and vagina. This screening involves a pelvic exam, which includes checking for microscopic changes to the surface of your cervix (Pap test).  For women ages 21-65, health care providers may recommend a pelvic exam and a Pap test every three years. For women ages 2-65, they may recommend the Pap test and pelvic exam, combined with testing for human papilloma virus (HPV), every five years. Some types of HPV increase your risk of cervical cancer. Testing for HPV may also be done on women of any age who have unclear Pap test results.  Other health care providers may not recommend any screening for nonpregnant women who are considered low risk for pelvic cancer and have no symptoms. Ask your health care provider if a screening pelvic exam is right for you.  If you have had past treatment for cervical cancer or a condition that could lead to cancer, you need Pap tests and screening for cancer for at least 20 years after your treatment. If Pap tests have been discontinued for you, your risk factors (such as having a new sexual partner) need to be reassessed to determine if you should start having screenings again. Some women have medical problems that increase the chance of getting cervical cancer. In these cases, your health care provider may recommend that you have screening and Pap tests more often.  If you have a family history of uterine cancer or ovarian cancer, talk with your health care provider about genetic screening.  If you have vaginal bleeding after reaching menopause, tell your health care provider.  There are currently no reliable tests available to screen for ovarian cancer. Lung Cancer  Lung cancer screening is recommended for adults 63-19 years old who are at high risk for lung cancer because of a history of smoking. A yearly low-dose CT scan of the lungs is recommended if you:  Currently smoke.  Have a  history of at least 30 pack-years of smoking and you currently smoke or have quit within the past 15 years. A pack-year is smoking an average of one pack of cigarettes per day for one year. Yearly screening should:  Continue until it has been 15 years since you quit.  Stop if you develop a health problem that would prevent you from having lung cancer treatment. Colorectal Cancer  This type of cancer can be detected and can often be prevented.  Routine colorectal cancer screening usually begins at age 26 and continues through age 36.  If you have risk factors for colon cancer, your health care provider may recommend that you be screened at an earlier age.  If you have a family history of colorectal cancer, talk with your health care provider about genetic screening.  Your health care provider may also recommend using home test kits to check for hidden blood in your stool.  A small camera at the end of a tube can be used to examine your colon directly (sigmoidoscopy or colonoscopy). This is done to check for the earliest forms of colorectal cancer.  Direct examination of the colon should be repeated every 5-10 years until age 35. However, if early forms of precancerous polyps or small growths are found or if you have a family history or genetic risk for colorectal cancer, you may need to be screened more often. Skin Cancer  Check your skin from head to toe regularly.  Monitor any moles. Be sure to tell your health care provider:  About any new moles or changes in moles, especially if there is a change in a mole's shape or color.  If you have a mole that is larger than the size of a pencil eraser.  If any of your family members has a history of skin cancer, especially at a young age, talk with your health care provider about genetic screening.  Always use sunscreen. Apply sunscreen liberally and repeatedly throughout the day.  Whenever you are outside, protect yourself by wearing long  sleeves, pants, a wide-brimmed hat, and sunglasses. What should I know about osteoporosis? Osteoporosis is a condition in which bone destruction happens more quickly than new bone creation. After menopause, you may be at an increased risk for osteoporosis. To help prevent osteoporosis or the bone fractures that can happen because of osteoporosis, the following is recommended:  If you are 18-66 years old, get at least 1,000 mg of calcium and at least 600 mg of vitamin D per day.  If you are older than age 63 but younger than age 38, get at least 1,200 mg of calcium and at least 600 mg of vitamin D per day.  If you are older than age 89, get at least 1,200 mg of calcium and at least 800 mg of vitamin D per day. Smoking and excessive alcohol intake increase the risk of osteoporosis. Eat foods that are rich in calcium and vitamin D, and do weight-bearing exercises several times each week as directed by your health care provider. What should I know about how menopause affects my mental health? Depression may occur at any age, but it is more common as you become older. Common symptoms of depression include:  Low or sad mood.  Changes in sleep patterns.  Changes in appetite or eating patterns.  Feeling an overall lack of motivation or enjoyment of activities that you previously enjoyed.  Frequent crying spells. Talk with your health care provider if you think that you are experiencing depression. What should I know about immunizations? It is important that you get and maintain your immunizations. These include:  Tetanus, diphtheria, and pertussis (Tdap) booster vaccine.  Influenza every year before the flu season begins.  Pneumonia vaccine.  Shingles vaccine. Your health care provider may also recommend other immunizations. This information is not intended to replace advice given to you by your health care provider. Make sure you discuss any questions you have with your health care  provider. Document Released: 11/29/2005 Document Revised: 04/26/2016 Document Reviewed: 07/11/2015 Elsevier Interactive Patient Education  2017 [employer].

## 2016-12-25 ENCOUNTER — Encounter: Payer: Self-pay | Admitting: Family Medicine

## 2016-12-25 LAB — COMPREHENSIVE METABOLIC PANEL WITH GFR
ALT: 19 [IU]/L (ref 0–32)
AST: 14 [IU]/L (ref 0–40)
Albumin/Globulin Ratio: 1.8 (ref 1.2–2.2)
Albumin: 3.9 g/dL (ref 3.5–4.8)
Alkaline Phosphatase: 60 [IU]/L (ref 39–117)
BUN/Creatinine Ratio: 20 (ref 12–28)
BUN: 22 mg/dL (ref 8–27)
Bilirubin Total: 0.6 mg/dL (ref 0.0–1.2)
CO2: 22 mmol/L (ref 18–29)
Calcium: 9.5 mg/dL (ref 8.7–10.3)
Chloride: 95 mmol/L — ABNORMAL LOW (ref 96–106)
Creatinine, Ser: 1.09 mg/dL — ABNORMAL HIGH (ref 0.57–1.00)
GFR calc Af Amer: 59 mL/min/{1.73_m2} — ABNORMAL LOW
GFR calc non Af Amer: 52 mL/min/{1.73_m2} — ABNORMAL LOW
Globulin, Total: 2.2 g/dL (ref 1.5–4.5)
Glucose: 182 mg/dL — ABNORMAL HIGH (ref 65–99)
Potassium: 4.1 mmol/L (ref 3.5–5.2)
Sodium: 139 mmol/L (ref 134–144)
Total Protein: 6.1 g/dL (ref 6.0–8.5)

## 2016-12-25 LAB — CBC WITH DIFFERENTIAL/PLATELET
Basophils Absolute: 0 10*3/uL (ref 0.0–0.2)
Basos: 1 %
EOS (ABSOLUTE): 0.2 10*3/uL (ref 0.0–0.4)
Eos: 2 %
Hematocrit: 42.8 % (ref 34.0–46.6)
Hemoglobin: 14.1 g/dL (ref 11.1–15.9)
Immature Grans (Abs): 0 10*3/uL (ref 0.0–0.1)
Immature Granulocytes: 0 %
Lymphocytes Absolute: 1.6 10*3/uL (ref 0.7–3.1)
Lymphs: 22 %
MCH: 29.3 pg (ref 26.6–33.0)
MCHC: 32.9 g/dL (ref 31.5–35.7)
MCV: 89 fL (ref 79–97)
Monocytes Absolute: 0.5 10*3/uL (ref 0.1–0.9)
Monocytes: 6 %
Neutrophils Absolute: 4.8 10*3/uL (ref 1.4–7.0)
Neutrophils: 69 %
Platelets: 333 10*3/uL (ref 150–379)
RBC: 4.82 x10E6/uL (ref 3.77–5.28)
RDW: 14.8 % (ref 12.3–15.4)
WBC: 7 10*3/uL (ref 3.4–10.8)

## 2016-12-25 LAB — VITAMIN D 25 HYDROXY (VIT D DEFICIENCY, FRACTURES): VIT D 25 HYDROXY: 43.9 ng/mL (ref 30.0–100.0)

## 2016-12-25 LAB — LIPID PANEL W/O CHOL/HDL RATIO
Cholesterol, Total: 213 mg/dL — ABNORMAL HIGH (ref 100–199)
HDL: 50 mg/dL
LDL Calculated: 122 mg/dL — ABNORMAL HIGH (ref 0–99)
Triglycerides: 204 mg/dL — ABNORMAL HIGH (ref 0–149)
VLDL Cholesterol Cal: 41 mg/dL — ABNORMAL HIGH (ref 5–40)

## 2016-12-25 LAB — TSH: TSH: 3.17 u[IU]/mL (ref 0.450–4.500)

## 2017-01-31 LAB — HM DIABETES EYE EXAM

## 2017-04-29 ENCOUNTER — Ambulatory Visit: Payer: Medicare Other | Admitting: Family Medicine

## 2017-05-19 ENCOUNTER — Encounter: Payer: Self-pay | Admitting: Family Medicine

## 2017-05-19 ENCOUNTER — Ambulatory Visit (INDEPENDENT_AMBULATORY_CARE_PROVIDER_SITE_OTHER): Payer: Medicare Other | Admitting: Family Medicine

## 2017-05-19 VITALS — BP 106/74 | HR 89 | Temp 98.3°F | Wt 236.1 lb

## 2017-05-19 DIAGNOSIS — M4696 Unspecified inflammatory spondylopathy, lumbar region: Secondary | ICD-10-CM

## 2017-05-19 DIAGNOSIS — M47816 Spondylosis without myelopathy or radiculopathy, lumbar region: Secondary | ICD-10-CM

## 2017-05-19 DIAGNOSIS — E1122 Type 2 diabetes mellitus with diabetic chronic kidney disease: Secondary | ICD-10-CM

## 2017-05-19 DIAGNOSIS — N182 Chronic kidney disease, stage 2 (mild): Secondary | ICD-10-CM | POA: Diagnosis not present

## 2017-05-19 MED ORDER — BENAZEPRIL-HYDROCHLOROTHIAZIDE 20-25 MG PO TABS: 1.0000 | ORAL_TABLET | Freq: Every day | ORAL | 1 refills | Status: DC

## 2017-05-19 NOTE — Progress Notes (Signed)
BP 106/74 (BP Location: Left Arm, Patient Position: Sitting, Cuff Size: Large)   Pulse 89   Temp 98.3 F (36.8 C)   Wt 236 lb 2 oz (107.1 kg)   SpO2 95%   BMI 42.50 kg/m    Subjective:    Patient ID: Karen Farley, female    DOB: 1946-03-02, 71 y.o.   MRN: [id number]  HPI: Karen Farley is a 71 y.o. female  Chief Complaint  Patient presents with  . Diabetes   DIABETES Hypoglycemic episodes:no Polydipsia/polyuria: no Visual disturbance: no Chest pain: no Paresthesias: no Glucose Monitoring: yes  Accucheck frequency: BID- constantly going up and down, seems to be changing, not sure what to do to help keep it under control Taking Insulin?: no Blood Pressure Monitoring: not checking Retinal Examination: Not up to Date Foot Exam: Up to Date Diabetic Education: Completed Pneumovax: Up to Date Influenza: Postpone to flu season Aspirin: no  Relevant past medical, surgical, family and social history reviewed and updated as indicated. Interim medical history since our last visit reviewed. Allergies and medications reviewed and updated.  Review of Systems  Constitutional: Negative.   Respiratory: Negative.   Cardiovascular: Negative.   Musculoskeletal: Positive for back pain, gait problem and myalgias. Negative for arthralgias, joint swelling, neck pain and neck stiffness.  Psychiatric/Behavioral: Negative.     Per HPI unless specifically indicated above     Objective:    BP 106/74 (BP Location: Left Arm, Patient Position: Sitting, Cuff Size: Large)   Pulse 89   Temp 98.3 F (36.8 C)   Wt 236 lb 2 oz (107.1 kg)   SpO2 95%   BMI 42.50 kg/m   Wt Readings from Last 3 Encounters:  05/19/17 236 lb 2 oz (107.1 kg)  12/24/16 234 lb (106.1 kg)  07/01/16 231 lb (104.8 kg)    Physical Exam  Constitutional: She is oriented to person, place, and time. She appears well-developed and well-nourished. No distress.  HENT:  Head: Normocephalic and atraumatic.    Right Ear: Hearing normal.  Left Ear: Hearing normal.  Nose: Nose normal.  Eyes: Conjunctivae and lids are normal. Right eye exhibits no discharge. Left eye exhibits no discharge. No scleral icterus.  Cardiovascular: Normal rate, regular rhythm, normal heart sounds and intact distal pulses.  Exam reveals no gallop and no friction rub.   No murmur heard. Pulmonary/Chest: Effort normal and breath sounds normal. No respiratory distress. She has no wheezes. She has no rales. She exhibits no tenderness.  Musculoskeletal: Normal range of motion.  Neurological: She is alert and oriented to person, place, and time.  Skin: Skin is warm, dry and intact. No rash noted. She is not diaphoretic. No erythema. No pallor.  Psychiatric: She has a normal mood and affect. Her speech is normal and behavior is normal. Judgment and thought content normal. Cognition and memory are normal.  Nursing note and vitals reviewed.   Results for orders placed or performed in visit on 12/24/16  Bayer DCA Hb A1c Waived  Result Value Ref Range   Bayer DCA Hb A1c Waived 7.9 (H) <7.0 %  CBC with Differential/Platelet  Result Value Ref Range   WBC 7.0 3.4 - 10.8 x10E3/uL   RBC 4.82 3.77 - 5.28 x10E6/uL   Hemoglobin 14.1 11.1 - 15.9 g/dL   Hematocrit 42.8 34.0 - 46.6 %   MCV 89 79 - 97 fL   MCH 29.3 26.6 - 33.0 pg   MCHC 32.9 31.5 - 35.7 g/dL  RDW 14.8 12.3 - 15.4 %   Platelets 333 150 - 379 x10E3/uL   Neutrophils 69 Not Estab. %   Lymphs 22 Not Estab. %   Monocytes 6 Not Estab. %   Eos 2 Not Estab. %   Basos 1 Not Estab. %   Neutrophils Absolute 4.8 1.4 - 7.0 x10E3/uL   Lymphocytes Absolute 1.6 0.7 - 3.1 x10E3/uL   Monocytes Absolute 0.5 0.1 - 0.9 x10E3/uL   EOS (ABSOLUTE) 0.2 0.0 - 0.4 x10E3/uL   Basophils Absolute 0.0 0.0 - 0.2 x10E3/uL   Immature Granulocytes 0 Not Estab. %   Immature Grans (Abs) 0.0 0.0 - 0.1 x10E3/uL  Comprehensive metabolic panel  Result Value Ref Range   Glucose 182 (H) 65 - 99 mg/dL    BUN 22 8 - 27 mg/dL   Creatinine, Ser 1.09 (H) 0.57 - 1.00 mg/dL   GFR calc non Af Amer 52 (L) >59 mL/min/1.73   GFR calc Af Amer 59 (L) >59 mL/min/1.73   BUN/Creatinine Ratio 20 12 - 28   Sodium 139 134 - 144 mmol/L   Potassium 4.1 3.5 - 5.2 mmol/L   Chloride 95 (L) 96 - 106 mmol/L   CO2 22 18 - 29 mmol/L   Calcium 9.5 8.7 - 10.3 mg/dL   Total Protein 6.1 6.0 - 8.5 g/dL   Albumin 3.9 3.5 - 4.8 g/dL   Globulin, Total 2.2 1.5 - 4.5 g/dL   Albumin/Globulin Ratio 1.8 1.2 - 2.2   Bilirubin Total 0.6 0.0 - 1.2 mg/dL   Alkaline Phosphatase 60 39 - 117 IU/L   AST 14 0 - 40 IU/L   ALT 19 0 - 32 IU/L  Lipid Panel w/o Chol/HDL Ratio  Result Value Ref Range   Cholesterol, Total 213 (H) 100 - 199 mg/dL   Triglycerides 204 (H) 0 - 149 mg/dL   HDL 50 >39 mg/dL   VLDL Cholesterol Cal 41 (H) 5 - 40 mg/dL   LDL Calculated 122 (H) 0 - 99 mg/dL  TSH  Result Value Ref Range   TSH 3.170 0.450 - 4.500 uIU/mL  VITAMIN D 25 Hydroxy (Vit-D Deficiency, Fractures)  Result Value Ref Range   Vit D, 25-Hydroxy 43.9 30.0 - 100.0 ng/mL      Assessment & Plan:   Problem List Items Addressed This Visit      Endocrine   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 Continue to work on diet and exercise and recheck 3 months, declines changes in medication at this time.       Relevant Medications   benazepril-hydrochlorthiazide (LOTENSIN HCT) 20-25 MG tablet   Other Relevant Orders   Bayer DCA Hb A1c Waived     Musculoskeletal and Integument   Facet syndrome, lumbar (HCC)    No better with shots. Does not want pain medication. Does want collapsible scooter- looking for one. Trouble walking while traveling. Will write letter when she finds one she likes.          Follow up plan: Return in about 3 months (around 08/19/2017) for DM follow up.

## 2017-05-19 NOTE — Assessment & Plan Note (Signed)
Still not under great control with A1c of 7.7. Continue to work on diet and exercise and recheck 3 months, declines changes in medication at this time.

## 2017-05-19 NOTE — Assessment & Plan Note (Signed)
No better with shots. Does not want pain medication. Does want collapsible scooter- looking for one. Trouble walking while traveling. Will write letter when she finds one she likes.

## 2017-05-20 LAB — BAYER DCA HB A1C WAIVED: HB A1C (BAYER DCA - WAIVED): 7.7 % — ABNORMAL HIGH (ref <7.0)

## 2017-08-06 ENCOUNTER — Telehealth: Payer: Self-pay | Admitting: Family Medicine

## 2017-08-06 NOTE — Telephone Encounter (Signed)
Routing to provider. I do not see a statin in med list. Is the patient supposed to be on one?

## 2017-08-06 NOTE — Telephone Encounter (Signed)
Patient is not on a class of statin. Pharmacy calling to check and make sure if patient is or is not on statin due to know being able to tolerate it or not.  Please Advise.  [employer]

## 2017-08-07 NOTE — Telephone Encounter (Signed)
Nope, she didn't want to take one last time we talked about it.

## 2017-08-22 ENCOUNTER — Encounter: Payer: Self-pay | Admitting: Family Medicine

## 2017-09-16 ENCOUNTER — Ambulatory Visit: Payer: Medicare Other | Admitting: Family Medicine

## 2017-09-23 ENCOUNTER — Ambulatory Visit: Payer: Medicare Other | Admitting: Family Medicine

## 2017-09-23 ENCOUNTER — Encounter: Payer: Self-pay | Admitting: Family Medicine

## 2017-09-23 VITALS — BP 125/84 | HR 88 | Wt 244.5 lb

## 2017-09-23 DIAGNOSIS — I129 Hypertensive chronic kidney disease with stage 1 through stage 4 chronic kidney disease, or unspecified chronic kidney disease: Secondary | ICD-10-CM | POA: Diagnosis not present

## 2017-09-23 DIAGNOSIS — E1122 Type 2 diabetes mellitus with diabetic chronic kidney disease: Secondary | ICD-10-CM | POA: Diagnosis not present

## 2017-09-23 DIAGNOSIS — E782 Mixed hyperlipidemia: Secondary | ICD-10-CM | POA: Diagnosis not present

## 2017-09-23 DIAGNOSIS — N182 Chronic kidney disease, stage 2 (mild): Secondary | ICD-10-CM | POA: Diagnosis not present

## 2017-09-23 LAB — BAYER DCA HB A1C WAIVED: HB A1C: 7.7 % — AB (ref <7.0)

## 2017-09-23 MED ORDER — METFORMIN HCL ER 500 MG PO TB24: 1000.0000 mg | ORAL_TABLET | Freq: Two times a day (BID) | ORAL | 1 refills | Status: DC

## 2017-09-23 NOTE — Patient Instructions (Addendum)
Karen Farley- lighter compression Farley  Semaglutide injection solution What is this medicine? SEMAGLUTIDE (Sem a GLOO tide) is used to improve blood sugar control in adults with type 2 diabetes. This medicine may be used with other diabetes medicines. This medicine may be used for other purposes; ask your health care provider or pharmacist if you have questions. COMMON BRAND NAME(S): OZEMPIC What should I tell my health care provider before I take this medicine? They need to know if you have any of these conditions: -endocrine tumors (MEN 2) or if someone in your family had these tumors -eye disease, vision problems -history of pancreatitis -kidney disease -stomach problems -thyroid cancer or if someone in your family had thyroid cancer -an unusual or allergic reaction to semaglutide, other medicines, foods, dyes, or preservatives -pregnant or trying to get pregnant -breast-feeding How should I use this medicine? This medicine is for injection under the skin of your upper leg (thigh), stomach area, or upper arm. It is given once every week (every 7 days). You will be taught how to prepare and give this medicine. Use exactly as directed. Take your medicine at regular intervals. Do not take it more often than directed. If you use this medicine with insulin, you should inject this medicine and the insulin separately. Do not mix them together. Do not give the injections right next to each other. Change (rotate) injection sites with each injection. It is important that you put your used needles and syringes in a special sharps container. Do not put them in a trash can. If you do not have a sharps container, call your pharmacist or healthcare provider to get one. A special MedGuide will be given to you by the pharmacist with each prescription and refill. Be sure to read this information carefully each time. Talk to your pediatrician regarding the use of this medicine in children. Special care may be  needed. Overdosage: If you think you have taken too much of this medicine contact a poison control center or emergency room at once. NOTE: This medicine is only for you. Do not share this medicine with others. What if I miss a dose? If you miss a dose, take it as soon as you can within 5 days after the missed dose. Then take your next dose at your regular weekly time. If it has been longer than 5 days after the missed dose, do not take the missed dose. Take the next dose at your regular time. Do not take double or extra doses. If you have questions about a missed dose, contact your health care provider for advice. What may interact with this medicine? -other medicines for diabetes Many medications may cause changes in blood sugar, these include: -alcohol containing beverages -antiviral medicines for HIV or AIDS -aspirin and aspirin-like drugs -certain medicines for blood pressure, heart disease, irregular heart beat -chromium -diuretics -female hormones, such as estrogens or progestins, birth control pills -fenofibrate -gemfibrozil -isoniazid -lanreotide -female hormones or anabolic steroids -MAOIs like Carbex, Eldepryl, Marplan, Nardil, and Parnate -medicines for weight loss -medicines for allergies, asthma, cold, or cough -medicines for depression, anxiety, or psychotic disturbances -niacin -nicotine -NSAIDs, medicines for pain and inflammation, like ibuprofen or naproxen -octreotide -pasireotide -pentamidine -phenytoin -probenecid -quinolone antibiotics such as ciprofloxacin, levofloxacin, ofloxacin -some herbal dietary supplements -steroid medicines such as prednisone or cortisone -sulfamethoxazole; trimethoprim -thyroid hormones Some medications can hide the warning symptoms of low blood sugar (hypoglycemia). You may need to monitor your blood sugar more closely if you are taking  one of these medications. These include: -beta-blockers, often used for high blood pressure or  heart problems (examples include atenolol, metoprolol, propranolol) -clonidine -guanethidine -reserpine This list may not describe all possible interactions. Give your health care provider a list of all the medicines, herbs, non-prescription drugs, or dietary supplements you use. Also tell them if you smoke, drink alcohol, or use illegal drugs. Some items may interact with your medicine. What should I watch for while using this medicine? Visit your doctor or health care professional for regular checks on your progress. Drink plenty of fluids while taking this medicine. Check with your doctor or health care professional if you get an attack of severe diarrhea, nausea, and vomiting. The loss of too much body fluid can make it dangerous for you to take this medicine. A test called the HbA1C (A1C) will be monitored. This is a simple blood test. It measures your blood sugar control over the last 2 to 3 months. You will receive this test every 3 to 6 months. Learn how to check your blood sugar. Learn the symptoms of low and high blood sugar and how to manage them. Always carry a quick-source of sugar with you in case you have symptoms of low blood sugar. Examples include hard sugar candy or glucose tablets. Make sure others know that you can choke if you eat or drink when you develop serious symptoms of low blood sugar, such as seizures or unconsciousness. They must get medical help at once. Tell your doctor or health care professional if you have high blood sugar. You might need to change the dose of your medicine. If you are sick or exercising more than usual, you might need to change the dose of your medicine. Do not skip meals. Ask your doctor or health care professional if you should avoid alcohol. Many nonprescription cough and cold products contain sugar or alcohol. These can affect blood sugar. Pens should never be shared. Even if the needle is changed, sharing may result in passing of viruses like  hepatitis or HIV. Wear a medical ID bracelet or chain, and carry a card that describes your disease and details of your medicine and dosage times. What side effects may I notice from receiving this medicine? Side effects that you should report to your doctor or health care professional as soon as possible: -allergic reactions like skin rash, itching or hives, swelling of the face, lips, or tongue -breathing problems -changes in vision -diarrhea that continues or is severe -lump or swelling on the neck -severe nausea -signs and symptoms of infection like fever or chills; cough; sore throat; pain or trouble passing urine -signs and symptoms of low blood sugar such as feeling anxious, confusion, dizziness, increased hunger, unusually weak or tired, sweating, shakiness, cold, irritable, headache, blurred vision, fast heartbeat, loss of consciousness -signs and symptoms of kidney injury like trouble passing urine or change in the amount of urine -trouble swallowing -unusual stomach upset or pain -vomiting Side effects that usually do not require medical attention (report to your doctor or health care professional if they continue or are bothersome): -constipation -diarrhea -nausea -pain, redness, or irritation at site where injected -stomach upset This list may not describe all possible side effects. Call your doctor for medical advice about side effects. You may report side effects to FDA at 1-800-FDA-1088. Where should I keep my medicine? Keep out of the reach of children. Store unopened pens in a refrigerator between 2 and 8 degrees C (36 and 46 degrees  F). Do not freeze. Protect from light and heat. After you first use the pen, it can be stored for 56 days at room temperature between 15 and 30 degrees C (59 and 86 degrees F) or in a refrigerator. Throw away your used pen after 56 days or after the expiration date, whichever comes first. Do not store your pen with the needle attached. If the  needle is left on, medicine may leak from the pen. NOTE: This sheet is a summary. It may not cover all possible information. If you have questions about this medicine, talk to your doctor, pharmacist, or health care provider.  2018 Elsevier/Gold Standard (2016-10-24 14:43:35)

## 2017-09-23 NOTE — Progress Notes (Signed)
BP 125/84 (BP Location: Right Arm, Patient Position: Sitting, Cuff Size: Large)   Pulse 88   Wt 244 lb 8 oz (110.9 kg)   SpO2 95%   BMI 44.01 kg/m    Subjective:    Patient ID: Karen Farley, female    DOB: 1946-01-22, 71 y.o.   MRN: [id number]  HPI: Karen Farley is a 71 y.o. female  Chief Complaint  Patient presents with  . Hypertension  . Hyperlipidemia  . Diabetes   DIABETES Hypoglycemic episodes:no Polydipsia/polyuria: no Visual disturbance: no Chest pain: no Paresthesias: no Glucose Monitoring: yes  Accucheck frequency: Daily  Fasting glucose: 140s Taking Insulin?: no Blood Pressure Monitoring: not checking Retinal Examination: Up to Date Foot Exam: Up to Date Diabetic Education: Completed Pneumovax: Up to Date Influenza: Not up to Date Aspirin: no  HYPERTENSION / Lloyd Satisfied with current treatment? yes Duration of hypertension: chronic BP monitoring frequency: not checking BP medication side effects: no Past BP meds: benazepril-HCTZ Duration of hyperlipidemia: chronic Cholesterol medication side effects: Not on anything Cholesterol supplements: none Past cholesterol medications: none Medication compliance: excellent compliance Aspirin: no Recent stressors: no Recurrent headaches: no Visual changes: no Palpitations: no Dyspnea: no Chest pain: no Lower extremity edema: no Dizzy/lightheaded: no   Relevant past medical, surgical, family and social history reviewed and updated as indicated. Interim medical history since our last visit reviewed. Allergies and medications reviewed and updated.  Review of Systems  Constitutional: Negative.   Respiratory: Negative.   Cardiovascular: Negative.   Musculoskeletal: Positive for back pain and myalgias. Negative for arthralgias, gait problem, joint swelling, neck pain and neck stiffness.  Skin: Negative.   Neurological: Negative.   Psychiatric/Behavioral: Negative.     Per HPI  unless specifically indicated above     Objective:    BP 125/84 (BP Location: Right Arm, Patient Position: Sitting, Cuff Size: Large)   Pulse 88   Wt 244 lb 8 oz (110.9 kg)   SpO2 95%   BMI 44.01 kg/m   Wt Readings from Last 3 Encounters:  09/23/17 244 lb 8 oz (110.9 kg)  05/19/17 236 lb 2 oz (107.1 kg)  12/24/16 234 lb (106.1 kg)    Physical Exam  Constitutional: She is oriented to person, place, and time. She appears well-developed and well-nourished. No distress.  HENT:  Head: Normocephalic and atraumatic.  Right Ear: Hearing normal.  Left Ear: Hearing normal.  Nose: Nose normal.  Eyes: Conjunctivae and lids are normal. Right eye exhibits no discharge. Left eye exhibits no discharge. No scleral icterus.  Cardiovascular: Normal rate, regular rhythm, normal heart sounds and intact distal pulses. Exam reveals no gallop and no friction rub.  No murmur heard. Pulmonary/Chest: Effort normal and breath sounds normal. No respiratory distress. She has no wheezes. She has no rales. She exhibits no tenderness.  Musculoskeletal: Normal range of motion.  Neurological: She is alert and oriented to person, place, and time.  Skin: Skin is warm, dry and intact. No rash noted. She is not diaphoretic. No erythema. No pallor.  Psychiatric: She has a normal mood and affect. Her speech is normal and behavior is normal. Judgment and thought content normal. Cognition and memory are normal.  Nursing note and vitals reviewed.  Diabetic Foot Exam - Simple   Simple Foot Form Diabetic Foot exam was performed with the following findings:  Yes 09/23/2017  4:17 PM  Visual Inspection No deformities, no ulcerations, no other skin breakdown bilaterally:  Yes Sensation Testing Intact to touch  and monofilament testing bilaterally:  Yes Pulse Check Posterior Tibialis and Dorsalis pulse intact bilaterally:  Yes Comments      Results for orders placed or performed in visit on 05/20/17  HM DIABETES EYE EXAM   Result Value Ref Range   HM Diabetic Eye Exam No Retinopathy No Retinopathy      Assessment & Plan:   Problem List Items Addressed This Visit      Endocrine   Type 2 diabetes mellitus with renal complication ([place]) - Primary    Stable at 7.7. Will increase her metformin to 1000mg  BID and recheck 3 months.       Relevant Medications   metFORMIN (GLUCOPHAGE XR) 500 MG 24 hr tablet   Other Relevant Orders   Bayer DCA Hb A1c Waived   Comprehensive metabolic panel     Genitourinary   Benign hypertensive renal disease    Under good control. Continue current regimen. Continue to monitor. Call with any concerns.       Relevant Orders   Comprehensive metabolic panel     Other   Hyperlipidemia    Refuses statin. Rechecking today. Call with any concerns. Continue to monitor.       Relevant Orders   Comprehensive metabolic panel   Lipid Panel w/o Chol/HDL Ratio       Follow up plan: Return After 12/24/16 for AWV/Physical.

## 2017-09-23 NOTE — Assessment & Plan Note (Signed)
Refuses statin. Rechecking today. Call with any concerns. Continue to monitor.

## 2017-09-23 NOTE — Assessment & Plan Note (Signed)
Stable at 7.7. Will increase her metformin to 1000mg  BID and recheck 3 months.

## 2017-09-23 NOTE — Assessment & Plan Note (Signed)
Under good control. Continue current regimen. Continue to monitor. Call with any concerns. 

## 2017-09-24 ENCOUNTER — Encounter: Payer: Self-pay | Admitting: Family Medicine

## 2017-09-24 ENCOUNTER — Other Ambulatory Visit: Payer: Self-pay | Admitting: Family Medicine

## 2017-09-24 LAB — COMPREHENSIVE METABOLIC PANEL
ALK PHOS: 63 IU/L (ref 39–117)
ALT: 20 IU/L (ref 0–32)
AST: 17 IU/L (ref 0–40)
Albumin/Globulin Ratio: 1.8 (ref 1.2–2.2)
Albumin: 4.2 g/dL (ref 3.5–4.8)
BILIRUBIN TOTAL: 0.5 mg/dL (ref 0.0–1.2)
BUN/Creatinine Ratio: 18 (ref 12–28)
BUN: 19 mg/dL (ref 8–27)
CHLORIDE: 102 mmol/L (ref 96–106)
CO2: 24 mmol/L (ref 20–29)
Calcium: 9.6 mg/dL (ref 8.7–10.3)
Creatinine, Ser: 1.07 mg/dL — ABNORMAL HIGH (ref 0.57–1.00)
GFR calc Af Amer: 60 mL/min/{1.73_m2} (ref >59)
GFR calc non Af Amer: 52 mL/min/{1.73_m2} — ABNORMAL LOW (ref >59)
GLUCOSE: 166 mg/dL — AB (ref 65–99)
Globulin, Total: 2.3 g/dL (ref 1.5–4.5)
POTASSIUM: 4.6 mmol/L (ref 3.5–5.2)
Sodium: 144 mmol/L (ref 134–144)
TOTAL PROTEIN: 6.5 g/dL (ref 6.0–8.5)

## 2017-09-24 LAB — LIPID PANEL W/O CHOL/HDL RATIO
CHOLESTEROL TOTAL: 217 mg/dL — AB (ref 100–199)
HDL: 47 mg/dL (ref >39)
LDL Calculated: 139 mg/dL — ABNORMAL HIGH (ref 0–99)
Triglycerides: 155 mg/dL — ABNORMAL HIGH (ref 0–149)
VLDL CHOLESTEROL CAL: 31 mg/dL (ref 5–40)

## 2017-11-24 ENCOUNTER — Other Ambulatory Visit: Payer: Self-pay | Admitting: Family Medicine

## 2017-12-10 ENCOUNTER — Telehealth: Payer: Self-pay

## 2017-12-10 NOTE — Telephone Encounter (Signed)
Copied from [place] [id number]. Topic: Medicare AWV >> Dec 10, 2017 12:59 PM Nohemy Koop, [place] A, LPN wrote: Reason for [id number] to schedule medicare annual wellness visit with [facility] at [facility].  Can be scheduled on 12/25/2017 anytime- can do labs this day if she wants. Or can schedule her at 2:15pm on 12/26/2017 prior to appt with Dr.Johnson.   Please put in appt notes: last awv 12/24/16

## 2017-12-12 ENCOUNTER — Telehealth: Payer: Self-pay

## 2017-12-12 NOTE — Telephone Encounter (Signed)
Patient called back, awv appt already in system.   Patient inquired about a prescription that was sent to her pharmacy that was $200.00 for the copay. After reviewing patients chart looks like metformin(fortamet) was sent on 11/24/2017. She is already currently taking metformin (glucophage XR). Dicussed with Dr.Johnson. Patient to continue taking what she has. She does not need to pick up the other medication. Informed pt.

## 2017-12-26 ENCOUNTER — Encounter: Payer: Medicare Other | Admitting: Family Medicine

## 2017-12-26 ENCOUNTER — Ambulatory Visit: Payer: Medicare Other

## 2018-01-07 ENCOUNTER — Telehealth: Payer: Self-pay | Admitting: Family Medicine

## 2018-01-07 MED ORDER — BENAZEPRIL-HYDROCHLOROTHIAZIDE 20-25 MG PO TABS: 1.0000 | ORAL_TABLET | Freq: Every day | ORAL | 0 refills | Status: DC

## 2018-01-07 NOTE — Telephone Encounter (Signed)
Copied from [place] [id number]. Topic: Quick Communication - Rx Refill/Question >> Jan 07, 2018  9:44 AM Lolita Rieger, RMA wrote: Medication: benazepril hctz 20-25 mg   Has the patient contacted their pharmacy? yes   (Agent: If no, request that the patient contact the pharmacy for the refill.)   Preferred Pharmacy (with phone number or street name): [facility] [address] [place]   Agent: Please be advised that RX refills may take up to 3 business days. We ask that you follow-up with your pharmacy.

## 2018-01-22 ENCOUNTER — Encounter: Payer: Self-pay | Admitting: Family Medicine

## 2018-01-22 ENCOUNTER — Ambulatory Visit (INDEPENDENT_AMBULATORY_CARE_PROVIDER_SITE_OTHER): Payer: Medicare Other | Admitting: Family Medicine

## 2018-01-22 ENCOUNTER — Ambulatory Visit (INDEPENDENT_AMBULATORY_CARE_PROVIDER_SITE_OTHER): Payer: Medicare Other

## 2018-01-22 VITALS — BP 128/82 | HR 83 | Temp 97.5°F | Resp 17 | Ht 61.0 in | Wt 237.1 lb

## 2018-01-22 VITALS — BP 128/82 | HR 83 | Temp 97.5°F | Ht 61.0 in | Wt 237.1 lb

## 2018-01-22 DIAGNOSIS — Z0000 Encounter for general adult medical examination without abnormal findings: Secondary | ICD-10-CM

## 2018-01-22 DIAGNOSIS — I129 Hypertensive chronic kidney disease with stage 1 through stage 4 chronic kidney disease, or unspecified chronic kidney disease: Secondary | ICD-10-CM

## 2018-01-22 DIAGNOSIS — R7989 Other specified abnormal findings of blood chemistry: Secondary | ICD-10-CM | POA: Diagnosis not present

## 2018-01-22 DIAGNOSIS — E782 Mixed hyperlipidemia: Secondary | ICD-10-CM | POA: Diagnosis not present

## 2018-01-22 DIAGNOSIS — N182 Chronic kidney disease, stage 2 (mild): Secondary | ICD-10-CM

## 2018-01-22 DIAGNOSIS — Z23 Encounter for immunization: Secondary | ICD-10-CM | POA: Diagnosis not present

## 2018-01-22 DIAGNOSIS — E1122 Type 2 diabetes mellitus with diabetic chronic kidney disease: Secondary | ICD-10-CM

## 2018-01-22 DIAGNOSIS — M4696 Unspecified inflammatory spondylopathy, lumbar region: Secondary | ICD-10-CM

## 2018-01-22 DIAGNOSIS — Z1239 Encounter for other screening for malignant neoplasm of breast: Secondary | ICD-10-CM

## 2018-01-22 DIAGNOSIS — R609 Edema, unspecified: Secondary | ICD-10-CM

## 2018-01-22 DIAGNOSIS — Z79899 Other long term (current) drug therapy: Secondary | ICD-10-CM | POA: Diagnosis not present

## 2018-01-22 DIAGNOSIS — M5136 Other intervertebral disc degeneration, lumbar region: Secondary | ICD-10-CM

## 2018-01-22 DIAGNOSIS — Z1231 Encounter for screening mammogram for malignant neoplasm of breast: Secondary | ICD-10-CM

## 2018-01-22 LAB — MICROALBUMIN, URINE WAIVED

## 2018-01-22 MED ORDER — BENAZEPRIL-HYDROCHLOROTHIAZIDE 20-25 MG PO TABS: 1.0000 | ORAL_TABLET | Freq: Every day | ORAL | 1 refills | Status: DC

## 2018-01-22 MED ORDER — METFORMIN HCL ER 500 MG PO TB24: 1000.0000 mg | ORAL_TABLET | Freq: Two times a day (BID) | ORAL | 1 refills | Status: DC

## 2018-01-22 MED ORDER — GLUCOSE BLOOD VI STRP: ORAL_STRIP | 12 refills | Status: DC

## 2018-01-22 MED ORDER — TRAMADOL HCL 50 MG PO TABS: 50.0000 mg | ORAL_TABLET | Freq: Four times a day (QID) | ORAL | 0 refills | Status: DC | PRN

## 2018-01-22 NOTE — Assessment & Plan Note (Signed)
Rechecking levels today. Await results. Continue to monitor.  

## 2018-01-22 NOTE — Patient Instructions (Addendum)
Karen Farley , Thank you for taking time to come for your Medicare Wellness Visit. I appreciate your ongoing commitment to your health goals. Please review the following plan we discussed and let me know if I can assist you in the future.   Screening recommendations/referrals: Colonoscopy: completed 04/09/2003 Mammogram: ordered. Please call [phone] to schedule your mammogram.  Bone Density: completed 12/06/2015 Recommended yearly ophthalmology/optometry visit for glaucoma screening and checkup Recommended yearly dental visit for hygiene and checkup  Vaccinations: Influenza vaccine: up to date  Pneumococcal vaccine: up to date Tdap vaccine: up to date Shingles vaccine: eligible, check with your insurance company for coverage     Advanced directives: Please bring a copy of your health care power of attorney and living will to the office at your convenience.  Conditions/risks identified: recommend drinking at least 6-8 glasses of water a day   Next appointment: Follow up in one year for your annual wellness exam.    Preventive Care 65 Years and Older, Female Preventive care refers to lifestyle choices and visits with your health care provider that can promote health and wellness. What does preventive care include?  A yearly physical exam. This is also called an annual well check.  Dental exams once or twice a year.  Routine eye exams. Ask your health care provider how often you should have your eyes checked.  Personal lifestyle choices, including:  Daily care of your teeth and gums.  Regular physical activity.  Eating a healthy diet.  Avoiding tobacco and drug use.  Limiting alcohol use.  Practicing safe sex.  Taking low-dose aspirin every day.  Taking vitamin and mineral supplements as recommended by your health care provider. What happens during an annual well check? The services and screenings done by your health care provider during your annual well check will  depend on your age, overall health, lifestyle risk factors, and family history of disease. Counseling  Your health care provider may ask you questions about your:  Alcohol use.  Tobacco use.  Drug use.  Emotional well-being.  Home and relationship well-being.  Sexual activity.  Eating habits.  History of falls.  Memory and ability to understand (cognition).  Work and work Statistician.  Reproductive health. Screening  You may have the following tests or measurements:  Height, weight, and BMI.  Blood pressure.  Lipid and cholesterol levels. These may be checked every 5 years, or more frequently if you are over 70 years old.  Skin check.  Lung cancer screening. You may have this screening every year starting at age 12 if you have a 30-pack-year history of smoking and currently smoke or have quit within the past 15 years.  Fecal occult blood test (FOBT) of the stool. You may have this test every year starting at age 71.  Flexible sigmoidoscopy or colonoscopy. You may have a sigmoidoscopy every 5 years or a colonoscopy every 10 years starting at age 51.  Hepatitis C blood test.  Hepatitis B blood test.  Sexually transmitted disease (STD) testing.  Diabetes screening. This is done by checking your blood sugar (glucose) after you have not eaten for a while (fasting). You may have this done every 1-3 years.  Bone density scan. This is done to screen for osteoporosis. You may have this done starting at age 59.  Mammogram. This may be done every 1-2 years. Talk to your health care provider about how often you should have regular mammograms. Talk with your health care provider about your test results, treatment  options, and if necessary, the need for more tests. Vaccines  Your health care provider may recommend certain vaccines, such as:  Influenza vaccine. This is recommended every year.  Tetanus, diphtheria, and acellular pertussis (Tdap, Td) vaccine. You may need a  Td booster every 10 years.  Zoster vaccine. You may need this after age 55.  Pneumococcal 13-valent conjugate (PCV13) vaccine. One dose is recommended after age 11.  Pneumococcal polysaccharide (PPSV23) vaccine. One dose is recommended after age 5. Talk to your health care provider about which screenings and vaccines you need and how often you need them. This information is not intended to replace advice given to you by your health care provider. Make sure you discuss any questions you have with your health care provider. Document Released: 11/03/2015 Document Revised: 06/26/2016 Document Reviewed: 08/08/2015 Elsevier Interactive Patient Education  2017 [place] Prevention in the Home Falls can cause injuries. They can happen to people of all ages. There are many things you can do to make your home safe and to help prevent falls. What can I do on the outside of my home?  Regularly fix the edges of walkways and driveways and fix any cracks.  Remove anything that might make you trip as you walk through a door, such as a raised step or threshold.  Trim any bushes or trees on the path to your home.  Use bright outdoor lighting.  Clear any walking paths of anything that might make someone trip, such as rocks or tools.  Regularly check to see if handrails are loose or broken. Make sure that both sides of any steps have handrails.  Any raised decks and porches should have guardrails on the edges.  Have any leaves, snow, or ice cleared regularly.  Use sand or salt on walking paths during winter.  Clean up any spills in your garage right away. This includes oil or grease spills. What can I do in the bathroom?  Use night lights.  Install grab bars by the toilet and in the tub and shower. Do not use towel bars as grab bars.  Use non-skid mats or decals in the tub or shower.  If you need to sit down in the shower, use a plastic, non-slip stool.  Keep the floor dry. Clean  up any water that spills on the floor as soon as it happens.  Remove soap buildup in the tub or shower regularly.  Attach bath mats securely with double-sided non-slip rug tape.  Do not have throw rugs and other things on the floor that can make you trip. What can I do in the bedroom?  Use night lights.  Make sure that you have a light by your bed that is easy to reach.  Do not use any sheets or blankets that are too big for your bed. They should not hang down onto the floor.  Have a firm chair that has side arms. You can use this for support while you get dressed.  Do not have throw rugs and other things on the floor that can make you trip. What can I do in the kitchen?  Clean up any spills right away.  Avoid walking on wet floors.  Keep items that you use a lot in easy-to-reach places.  If you need to reach something above you, use a strong step stool that has a grab bar.  Keep electrical cords out of the way.  Do not use floor polish or wax that makes floors slippery.  If you must use wax, use non-skid floor wax.  Do not have throw rugs and other things on the floor that can make you trip. What can I do with my stairs?  Do not leave any items on the stairs.  Make sure that there are handrails on both sides of the stairs and use them. Fix handrails that are broken or loose. Make sure that handrails are as long as the stairways.  Check any carpeting to make sure that it is firmly attached to the stairs. Fix any carpet that is loose or worn.  Avoid having throw rugs at the top or bottom of the stairs. If you do have throw rugs, attach them to the floor with carpet tape.  Make sure that you have a light switch at the top of the stairs and the bottom of the stairs. If you do not have them, ask someone to add them for you. What else can I do to help prevent falls?  Wear shoes that:  Do not have high heels.  Have rubber bottoms.  Are comfortable and fit you well.  Are  closed at the toe. Do not wear sandals.  If you use a stepladder:  Make sure that it is fully opened. Do not climb a closed stepladder.  Make sure that both sides of the stepladder are locked into place.  Ask someone to hold it for you, if possible.  Clearly mark and make sure that you can see:  Any grab bars or handrails.  First and last steps.  Where the edge of each step is.  Use tools that help you move around (mobility aids) if they are needed. These include:  Canes.  Walkers.  Scooters.  Crutches.  Turn on the lights when you go into a dark area. Replace any light bulbs as soon as they burn out.  Set up your furniture so you have a clear path. Avoid moving your furniture around.  If any of your floors are uneven, fix them.  If there are any pets around you, be aware of where they are.  Review your medicines with your doctor. Some medicines can make you feel dizzy. This can increase your chance of falling. Ask your doctor what other things that you can do to help prevent falls. This information is not intended to replace advice given to you by your health care provider. Make sure you discuss any questions you have with your health care provider. Document Released: 08/03/2009 Document Revised: 03/14/2016 Document Reviewed: 11/11/2014 Elsevier Interactive Patient Education  2017 [employer].

## 2018-01-22 NOTE — Progress Notes (Signed)
BP 128/82 (BP Location: Left Arm, Cuff Size: Large)   Pulse 83   Temp (!) 97.5 F (36.4 C) (Oral)   Ht 5\' 1"  (1.549 m)   Wt 237 lb 2 oz (107.6 kg)   SpO2 96%   BMI 44.80 kg/m    Subjective:    Patient ID: Karen Farley, female    DOB: December 18, 1945, 72 y.o.   MRN: [id number]  HPI: VERTIS BAUDER is a 72 y.o. female presenting on 01/22/2018 for comprehensive medical examination. Current medical complaints include:  HYPERTENSION / HYPERLIPIDEMIA Satisfied with current treatment? yes Duration of hypertension: chronic BP monitoring frequency: not checking BP medication side effects: no Past BP meds: benazepril, hctz Duration of hyperlipidemia: chronic Cholesterol medication side effects: Not on anything Cholesterol supplements: none Past cholesterol medications: none Medication compliance: excellent compliance Aspirin: no Recent stressors: no Recurrent headaches: no Visual changes: no Palpitations: no Dyspnea: no Chest pain: no Lower extremity edema: no Dizzy/lightheaded: no  DIABETES Hypoglycemic episodes:no Polydipsia/polyuria: no Visual disturbance: no Chest pain: no Paresthesias: no Glucose Monitoring: yes- daily, 124-150s/160s Taking Insulin?: no Blood Pressure Monitoring: not checking Retinal Examination: Up to Date Foot Exam: Up to Date Diabetic Education: Completed Pneumovax: Up to Date Influenza: Up to Date  Low back hurts all the time, but has been hurting a bit more in her tailbone. Will take OTC medicines occasionally. She notes that that doesn't seem to be helping with her tailbone. She notes that it's hurting both moving and sitting now. This has been happening for the last few weeks. She notes that this was happening occasionally, but is now happening 2-3x a week. The only thing that helps with that is laying down. Has been to the pain clinic and had steroid injections that didn't help at all. She last had x-rays done in 2016. She is wondering if  she can have something to help occasionally maybe a couple times a week.   She currently lives with: husband Menopausal Symptoms: no  Depression Screen done today and results listed below:  Depression screen [facility] 2/9 01/22/2018 12/24/2016 05/02/2016 03/13/2016 01/17/2016  Decreased Interest 0 0 0 0 0  Down, Depressed, Hopeless 0 0 0 0 0  PHQ - 2 Score 0 0 0 0 0    Past Medical History:  Past Medical History:  Diagnosis Date  . 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 DDD (degenerative disc disease), lumbar   . Degenerative disc disease, lumbar    bulging and dengerated  . Diabetes mellitus without complication ([place])   . Hyperlipidemia   . Hypertension   . Osteoarthritis of both knees   . Osteopenia     Surgical History:  Past Surgical History:  Procedure Laterality Date  . CHOLECYSTECTOMY    . DILATION AND CURETTAGE OF UTERUS    . TEAR DUCT PROBING WITH STRABISMUS REPAIR Right   . TONSILLECTOMY      Medications:  Current Outpatient Medications on File Prior to Visit  Medication Sig  . Azelaic Acid 15 % cream Apply topically 2 (two) times daily. Reported on 01/09/2016  . Elastic Bandages & Supports (MEDICAL COMPRESSION STOCKINGS) MISC by Does not apply route.  . latanoprost (XALATAN) 0.005 % ophthalmic solution 1 drop at bedtime.  . Multiple Vitamin (MULTIVITAMIN WITH MINERALS) TABS tablet Take 1 tablet by mouth daily.   No current facility-administered medications on file prior to visit.     Allergies:  No Known Allergies  Social History:  Social History   Socioeconomic History  . Marital status: Married    Spouse name: Not on file  . Number of children: Not on file  . Years of education: Not on file  . Highest education level: Not on file  Occupational History  . Not on file  Social Needs  . Financial resource strain: Not hard at all  . Food insecurity:    Worry: Never true    Inability: Never true  . Transportation needs:    Medical: No     Non-medical: No  Tobacco Use  . Smoking status: Never Smoker  . Smokeless tobacco: Never Used  Substance and Sexual Activity  . Alcohol use: Yes    Alcohol/week: 0.0 oz    Comment: on rare occasion  . Drug use: No  . Sexual activity: Not Currently  Lifestyle  . Physical activity:    Days per week: 0 days    Minutes per session: 0 min  . Stress: Not at all  Relationships  . Social connections:    Talks on phone: More than three times a week    Gets together: Once a week    Attends religious service: 1 to 4 times per year    Active member of club or organization: No    Attends meetings of clubs or organizations: Never    Relationship status: Married  . Intimate partner violence:    Fear of current or ex partner: No    Emotionally abused: No    Physically abused: No    Forced sexual activity: No  Other Topics Concern  . Not on file  Social History Narrative  . Not on file   Social History   Tobacco Use  Smoking Status Never Smoker  Smokeless Tobacco Never Used   Social History   Substance and Sexual Activity  Alcohol Use Yes  . Alcohol/week: 0.0 oz   Comment: on rare occasion    Family History:  Family History  Problem Relation Age of Onset  . Heart disease Mother        CHF  . Cancer Mother        Breast  . Osteoporosis Mother   . Heart disease Father 69  . Cancer Brother        Colon CA- 2002- Youngest brother  . Parkinson's disease Brother        Younger Brother  . Heart disease Brother        2 brothers  . Cancer Maternal Grandmother        Breast  . Breast cancer Neg Hx     Past medical history, surgical history, medications, allergies, family history and social history reviewed with patient today and changes made to appropriate areas of the chart.   Review of Systems  Constitutional: Negative.   HENT: Negative.   Eyes: Negative.   Respiratory: Negative.   Cardiovascular: Positive for leg swelling (better with compression stocking). Negative  for chest pain, palpitations, orthopnea, claudication and PND.  Gastrointestinal: Positive for diarrhea. Negative for abdominal pain, blood in stool, constipation, heartburn, melena, nausea and vomiting.  Genitourinary: Negative.   Musculoskeletal: Positive for back pain, joint pain and myalgias. Negative for falls and neck pain.  Skin: Negative.   Neurological: Negative.   Endo/Heme/Allergies: Negative.  Negative for environmental allergies and polydipsia. Does not bruise/bleed easily.  Psychiatric/Behavioral: Negative.    All other ROS negative except what is listed above and in the HPI.      Objective:  BP 128/82 (BP Location: Left Arm, Cuff Size: Large)   Pulse 83   Temp (!) 97.5 F (36.4 C) (Oral)   Ht 5\' 1"  (1.549 m)   Wt 237 lb 2 oz (107.6 kg)   SpO2 96%   BMI 44.80 kg/m   Wt Readings from Last 3 Encounters:  01/22/18 237 lb 2 oz (107.6 kg)  01/22/18 237 lb 2 oz (107.6 kg)  09/23/17 244 lb 8 oz (110.9 kg)    Physical Exam  Constitutional: She is oriented to person, place, and time. She appears well-developed and well-nourished. No distress.  HENT:  Head: Normocephalic and atraumatic.  Right Ear: Hearing, tympanic membrane, external ear and ear canal normal.  Left Ear: Hearing, tympanic membrane, external ear and ear canal normal.  Nose: Nose normal.  Mouth/Throat: Uvula is midline, oropharynx is clear and moist and mucous membranes are normal. No oropharyngeal exudate.  Eyes: Pupils are equal, round, and reactive to light. Conjunctivae, EOM and lids are normal. Right eye exhibits no discharge. Left eye exhibits no discharge. No scleral icterus.  Neck: Normal range of motion. Neck supple. No JVD present. No tracheal deviation present. No thyromegaly present.  Cardiovascular: Normal rate, regular rhythm, normal heart sounds and intact distal pulses. Exam reveals no gallop and no friction rub.  No murmur heard. Pulmonary/Chest: Effort normal and breath sounds normal. No  stridor. No respiratory distress. She has no wheezes. She has no rales. She exhibits no tenderness.  Abdominal: Soft. Bowel sounds are normal. She exhibits no distension and no mass. There is no tenderness. There is no rebound and no guarding.  Genitourinary:  Genitourinary Comments: Breast and pelvic exams deferred with shared decision making.   Musculoskeletal: She exhibits tenderness. She exhibits no edema or deformity.  Lymphadenopathy:    She has no cervical adenopathy.  Neurological: She is alert and oriented to person, place, and time. She has normal reflexes. She displays normal reflexes. No cranial nerve deficit. She exhibits normal muscle tone. Coordination normal.  Skin: Skin is warm, dry and intact. No rash noted. She is not diaphoretic. No erythema. No pallor.  Psychiatric: She has a normal mood and affect. Her speech is normal and behavior is normal. Judgment and thought content normal. Cognition and memory are normal.  Nursing note and vitals reviewed.   Results for orders placed or performed in visit on 01/22/18  Bayer DCA Hb A1c Waived  Result Value Ref Range   Bayer DCA Hb A1c Waived 7.0 (H) <7.0 %  CBC with Differential/Platelet  Result Value Ref Range   WBC 6.3 3.4 - 10.8 x10E3/uL   RBC 4.96 3.77 - 5.28 x10E6/uL   Hemoglobin 14.0 11.1 - 15.9 g/dL   Hematocrit 43.5 34.0 - 46.6 %   MCV 88 79 - 97 fL   MCH 28.2 26.6 - 33.0 pg   MCHC 32.2 31.5 - 35.7 g/dL   RDW 14.4 12.3 - 15.4 %   Platelets 303 150 - 379 x10E3/uL   Neutrophils 69 Not Estab. %   Lymphs 18 Not Estab. %   Monocytes 8 Not Estab. %   Eos 3 Not Estab. %   Basos 1 Not Estab. %   Neutrophils Absolute 4.5 1.4 - 7.0 x10E3/uL   Lymphocytes Absolute 1.1 0.7 - 3.1 x10E3/uL   Monocytes Absolute 0.5 0.1 - 0.9 x10E3/uL   EOS (ABSOLUTE) 0.2 0.0 - 0.4 x10E3/uL   Basophils Absolute 0.0 0.0 - 0.2 x10E3/uL   Immature Granulocytes 1 Not Estab. %  Immature Grans (Abs) 0.0 0.0 - 0.1 x10E3/uL  Comprehensive metabolic  panel  Result Value Ref Range   Glucose 164 (H) 65 - 99 mg/dL   BUN 19 8 - 27 mg/dL   Creatinine, Ser 1.24 (H) 0.57 - 1.00 mg/dL   GFR calc non Af Amer 44 (L) >59 mL/min/1.73   GFR calc Af Amer 50 (L) >59 mL/min/1.73   BUN/Creatinine Ratio 15 12 - 28   Sodium 142 134 - 144 mmol/L   Potassium 4.2 3.5 - 5.2 mmol/L   Chloride 101 96 - 106 mmol/L   CO2 22 20 - 29 mmol/L   Calcium 9.7 8.7 - 10.3 mg/dL   Total Protein 6.2 6.0 - 8.5 g/dL   Albumin 4.2 3.5 - 4.8 g/dL   Globulin, Total 2.0 1.5 - 4.5 g/dL   Albumin/Globulin Ratio 2.1 1.2 - 2.2   Bilirubin Total 0.6 0.0 - 1.2 mg/dL   Alkaline Phosphatase 61 39 - 117 IU/L   AST 14 0 - 40 IU/L   ALT 22 0 - 32 IU/L  Lipid Panel w/o Chol/HDL Ratio  Result Value Ref Range   Cholesterol, Total 211 (H) 100 - 199 mg/dL   Triglycerides 151 (H) 0 - 149 mg/dL   HDL 49 >39 mg/dL   VLDL Cholesterol Cal 30 5 - 40 mg/dL   LDL Calculated 132 (H) 0 - 99 mg/dL  Microalbumin, Urine Waived  Result Value Ref Range   Microalb, Ur Waived WILL FOLLOW    Creatinine, Urine Waived WILL FOLLOW    Microalb/Creat Ratio WILL FOLLOW   TSH  Result Value Ref Range   TSH 4.080 0.450 - 4.500 uIU/mL  UA/M w/rflx Culture, Routine  Result Value Ref Range   Specific Gravity, UA WILL FOLLOW    pH, UA WILL FOLLOW    Color, UA WILL FOLLOW    Appearance Ur WILL FOLLOW    Leukocytes, UA WILL FOLLOW    Protein, UA WILL FOLLOW    Glucose, UA WILL FOLLOW    Ketones, UA WILL FOLLOW    RBC, UA WILL FOLLOW    Bilirubin, UA WILL FOLLOW    Urobilinogen, Ur WILL FOLLOW    Nitrite, UA WILL FOLLOW    Microscopic Examination WILL FOLLOW    Microscopic Examination WILL FOLLOW    Urinalysis Reflex WILL FOLLOW       Assessment & Plan:   Problem List Items Addressed This Visit      Endocrine   Type 2 diabetes mellitus with renal complication (HCC)    D4Y under better control at 7.0. Continue current regimen. Continue to monitor. Call with any concerns. Recheck 3 months.         Relevant Medications   metFORMIN (GLUCOPHAGE XR) 500 MG 24 hr tablet   benazepril-hydrochlorthiazide (LOTENSIN HCT) 20-25 MG tablet   Other Relevant Orders   Bayer DCA Hb A1c Waived (Completed)   CBC with Differential/Platelet (Completed)   Comprehensive metabolic panel (Completed)   Microalbumin, Urine Waived (Completed)   UA/M w/rflx Culture, Routine (Completed)   CKD stage 2 due to type 2 diabetes mellitus ([place])    Rechecking levels today. Await results. Continue to monitor.       Relevant Medications   metFORMIN (GLUCOPHAGE XR) 500 MG 24 hr tablet   benazepril-hydrochlorthiazide (LOTENSIN HCT) 20-25 MG tablet   Other Relevant Orders   Bayer DCA Hb A1c Waived (Completed)   CBC with Differential/Platelet (Completed)   Comprehensive metabolic panel (Completed)   Microalbumin, Urine Waived (  Completed)   UA/M w/rflx Culture, Routine (Completed)     Musculoskeletal and Integument   DDD (degenerative disc disease), lumbar    Worsening. Will obtain x-rays to make sure nothing has changed.  Starting to have some increased pain even with sitting. Pain management didn't help. Will consider PT. Will start PRN tramadol- to take a pill 2-3x a week. Controlled substance agreement signed today. Rx for tramadol given today. Call with any concerns.       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 Will obtain x-rays to make sure nothing has changed.  Starting to have some increased pain even with sitting. Pain management didn't help. Will consider PT. Will start PRN tramadol- to take a pill 2-3x a week. Controlled substance agreement signed today. Rx for tramadol given today. Call with any concerns.       Relevant Medications   traMADol (ULTRAM) 50 MG tablet     Genitourinary   Benign hypertensive renal disease    Under good control on  current regimen. Continue to monitor. Call with any concerns. Continue current regimen. Refills given today.       Relevant Orders   Bayer DCA Hb A1c Waived (Completed)   CBC with Differential/Platelet (Completed)   Comprehensive metabolic panel (Completed)   Microalbumin, Urine Waived (Completed)   UA/M w/rflx Culture, Routine (Completed)     Other   Hyperlipidemia    Under good control on current regimen. Continue to monitor. Call with any concerns. Continue current regimen. Refills given today.       Relevant Medications   benazepril-hydrochlorthiazide (LOTENSIN HCT) 20-25 MG tablet   Other Relevant Orders   Bayer DCA Hb A1c Waived (Completed)   CBC with Differential/Platelet (Completed)   Comprehensive metabolic panel (Completed)   Lipid Panel w/o Chol/HDL Ratio (Completed)   UA/M w/rflx Culture, Routine (Completed)   Edema    Stable. Continue current regimen. Continue to monitor. Call with any concerns.       Relevant Orders   Bayer DCA Hb A1c Waived (Completed)   CBC with Differential/Platelet (Completed)   Comprehensive metabolic panel (Completed)   UA/M w/rflx Culture, Routine (Completed)   Abnormal thyroid blood test    Rechecking levels today. Await results and treat as needed. Call with any concerns.       Relevant Orders   Bayer DCA Hb A1c Waived (Completed)   CBC with Differential/Platelet (Completed)   Comprehensive metabolic panel (Completed)   TSH (Completed)   UA/M w/rflx Culture, Routine (Completed)   Controlled substance agreement signed    For Tramadol 01/22/18- see scanned document.        Other Visit Diagnoses    Routine general medical examination at a health care facility    -  Primary   Vaccines up to date. Screening labs checked today. Mammogram ordered. Pap N/A. DEXA up to date. Colonoscopy declined. Continue diet and exercise.   Need for pneumococcal vaccine       Prevnar given today   Relevant Orders   Pneumococcal conjugate vaccine  13-valent (Completed)   Screening for breast cancer       Mammogram ordered today       Follow up plan: Return in about 3 months (around 04/23/2018) for DM follow up.   LABORATORY TESTING:  - Pap smear: not applicable  IMMUNIZATIONS:   -  Tdap: Tetanus vaccination status reviewed: last tetanus booster within 10 years. - Influenza: Up to date - Pneumovax: Up to date - Prevnar: Administered today  SCREENING: -Mammogram: Ordered today  - Colonoscopy: Refused  - Bone Density: Up to date   PATIENT COUNSELING:   Advised to take 1 mg of folate supplement per day if capable of pregnancy.   Sexuality: Discussed sexually transmitted diseases, partner selection, use of condoms, avoidance of unintended pregnancy  and contraceptive alternatives.   Advised to avoid cigarette smoking.  I discussed with the patient that most people either abstain from alcohol or drink within safe limits (<=14/week and <=4 drinks/occasion for males, <=7/weeks and <= 3 drinks/occasion for females) and that the risk for alcohol disorders and other health effects rises proportionally with the number of drinks per week and how often a drinker exceeds daily limits.  Discussed cessation/primary prevention of drug use and availability of treatment for abuse.   Diet: Encouraged to adjust caloric intake to maintain  or achieve ideal body weight, to reduce intake of dietary saturated fat and total fat, to limit sodium intake by avoiding high sodium foods and not adding table salt, and to maintain adequate dietary potassium and calcium preferably from fresh fruits, vegetables, and low-fat dairy products.    stressed the importance of regular exercise  Injury prevention: Discussed safety belts, safety helmets, smoke detector, smoking near bedding or upholstery.   Dental health: Discussed importance of regular tooth brushing, flossing, and dental visits.    NEXT PREVENTATIVE PHYSICAL DUE IN 1 YEAR. Return in about 3  months (around 04/23/2018) for DM follow up.

## 2018-01-22 NOTE — Progress Notes (Addendum)
Subjective:   Karen Farley is a 72 y.o. female who presents for Medicare Annual (Subsequent) preventive examination.  Review of Systems:   Cardiac Risk Factors include: hypertension;dyslipidemia;diabetes mellitus;advanced age (>41men, >61 women);obesity (BMI >30kg/m2)     Objective:     Vitals: BP 128/82 (BP Location: Left Arm, Patient Position: Sitting)   Pulse 83   Temp (!) 97.5 F (36.4 C) (Oral)   Resp 17   Ht 5\' 1"  (1.549 m)   Wt 237 lb 2 oz (107.6 kg)   BMI 44.80 kg/m   Body mass index is 44.8 kg/m.  Advanced Directives 01/22/2018 12/24/2016 07/01/2016 05/02/2016 03/25/2016 03/13/2016 02/21/2016  Does Patient Have a Medical Advance Directive? Yes Yes Yes No Yes Yes Yes  Type of Advance Directive Living will;Healthcare Power of [place];Living will - - - [place];Living will [place];Living will  Does patient want to make changes to medical advance directive? - No - Patient declined - - - No - Patient declined -  Copy of [place] in Chart? No - copy requested No - copy requested - - Yes - -  Would patient like information on creating a medical advance directive? - - - - - - -    Tobacco Social History   Tobacco Use  Smoking Status Never Smoker  Smokeless Tobacco Never Used     Counseling given: Not Answered   Clinical Intake:  Pre-visit preparation completed: Yes  Pain : No/denies pain     Nutritional Status: BMI > 30  Obese Nutritional Risks: None Diabetes: Yes CBG done?: No Did pt. bring in CBG monitor from home?: No  How often do you need to have someone help you when you read instructions, pamphlets, or other written materials from your doctor or pharmacy?: 1 - Never What is the last grade level you completed in school?: high school - some college   Interpreter Needed?: No  Information entered by :: Cuca Benassi,LPN   Past Medical History:  Diagnosis Date  .  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 DDD (degenerative disc disease), lumbar   . Degenerative disc disease, lumbar    bulging and dengerated  . Diabetes mellitus without complication ([place])   . Hyperlipidemia   . Hypertension   . Osteoarthritis of both knees   . Osteopenia    Past Surgical History:  Procedure Laterality Date  . CHOLECYSTECTOMY    . DILATION AND CURETTAGE OF UTERUS    . TEAR DUCT PROBING WITH STRABISMUS REPAIR Right   . TONSILLECTOMY     Family History  Problem Relation Age of Onset  . Heart disease Mother        CHF  . Cancer Mother        Breast  . Osteoporosis Mother   . Heart disease Father 53  . Cancer Brother        Colon CA- 2002- Youngest brother  . Parkinson's disease Brother        Younger Brother  . Heart disease Brother        2 brothers  . Cancer Maternal Grandmother        Breast  . Breast cancer Neg Hx    Social History   Socioeconomic History  . Marital status: Married    Spouse name: Not on file  . Number of children: Not on file  . Years of education: Not on file  .  Highest education level: Not on file  Occupational History  . Not on file  Social Needs  . Financial resource strain: Not hard at all  . Food insecurity:    Worry: Never true    Inability: Never true  . Transportation needs:    Medical: No    Non-medical: No  Tobacco Use  . Smoking status: Never Smoker  . Smokeless tobacco: Never Used  Substance and Sexual Activity  . Alcohol use: Yes    Alcohol/week: 0.0 oz    Comment: on rare occasion  . Drug use: No  . Sexual activity: Not Currently  Lifestyle  . Physical activity:    Days per week: 0 days    Minutes per session: 0 min  . Stress: Not at all  Relationships  . Social connections:    Talks on phone: More than three times a week    Gets together: Once a week    Attends religious service: 1 to 4 times per year    Active member of club or organization: No    Attends meetings of clubs or  organizations: Never    Relationship status: Married  Other Topics Concern  . Not on file  Social History Narrative  . Not on file    Outpatient Encounter Medications as of 01/22/2018  Medication Sig  . Azelaic Acid 15 % cream Apply topically 2 (two) times daily. Reported on 01/09/2016  . benazepril-hydrochlorthiazide (LOTENSIN HCT) 20-25 MG tablet Take 1 tablet by mouth daily.  Karen Farley Bandages & Supports (MEDICAL COMPRESSION STOCKINGS) MISC by Does not apply route.  Marland Kitchen glucose blood (ONE TOUCH ULTRA TEST) test strip TEST once daily  . latanoprost (XALATAN) 0.005 % ophthalmic solution 1 drop at bedtime.  . metFORMIN (GLUCOPHAGE XR) 500 MG 24 hr tablet Take 2 tablets (1,000 mg total) by mouth 2 (two) times daily.  . Multiple Vitamin (MULTIVITAMIN WITH MINERALS) TABS tablet Take 1 tablet by mouth daily.  . traMADol (ULTRAM) 50 MG tablet Take 1-2 tablets (50-100 mg total) by mouth every 6 (six) hours as needed.   No facility-administered encounter medications on file as of 01/22/2018.     Activities of Daily Living In your present state of health, do you have any difficulty performing the following activities: 01/22/2018  Hearing? N  Vision? N  Difficulty concentrating or making decisions? Y  Walking or climbing stairs? Y  Comment pain in knees and back   Dressing or bathing? N  Doing errands, shopping? N  Preparing Food and eating ? N  Using the Toilet? N  In the past six months, have you accidently leaked urine? N  Do you have problems with loss of bowel control? N  Managing your Medications? N  Managing your Finances? N  Housekeeping or managing your Housekeeping? N  Some recent data might be hidden    Patient Care Team: Valerie Roys, DO as PCP - General (Family Medicine)    Assessment:   This is a routine wellness examination for Karen Farley.  Exercise Activities and Dietary recommendations Current Exercise Habits: The patient does not participate in regular exercise at  present, Exercise limited by: orthopedic condition(s)  Goals    . DIET - INCREASE WATER INTAKE     ecommend drinking at least 6-8 glasses of water a day        Fall Risk Fall Risk  01/22/2018 12/24/2016 05/02/2016 03/25/2016 03/13/2016  Falls in the past year? No No No No No   Is the patient's  home free of loose throw rugs in walkways, pet beds, electrical cords, etc?   yes      Grab bars in the bathroom? no      Handrails on the stairs?   yes      Adequate lighting?   yes  Timed Get Up and Go performed: Completed in 8 seconds with no use of assistive devices, steady gait. No intervention needed at this time.   Depression Screen PHQ 2/9 Scores 01/22/2018 12/24/2016 05/02/2016 03/25/2016  PHQ - 2 Score 0 0 0 -  Exception Documentation - - - Patient refusal     Cognitive Function     6CIT Screen 01/22/2018 12/24/2016  What Year? 0 points 0 points  What month? 0 points 0 points  What time? 0 points 0 points  Count back from 20 0 points 0 points  Months in reverse 0 points 2 points  Repeat phrase 0 points 0 points  Total Score 0 2    Immunization History  Administered Date(s) Administered  . Pneumococcal Conjugate-13 01/22/2018  . Pneumococcal Polysaccharide-23 11/05/2011  . Td 10/21/2008    Qualifies for Shingles Vaccine?yes, discussed shingrix vaccine   Screening Tests Health Maintenance  Topic Date Due  . Hepatitis C Screening  09/23/2018 (Originally 1945/12/07)  . OPHTHALMOLOGY EXAM  01/31/2018  . HEMOGLOBIN A1C  03/24/2018  . INFLUENZA VACCINE  05/21/2018  . FOOT EXAM  09/23/2018  . TETANUS/TDAP  10/21/2018  . DEXA SCAN  Completed  . PNA vac Low Risk Adult  Completed  . MAMMOGRAM  Discontinued  . COLONOSCOPY  Discontinued    Cancer Screenings: Lung: Low Dose CT Chest recommended if Age 50-80 years, 30 pack-year currently smoking OR have quit w/in 15years. Patient does not qualify. Breast:  Up to date on Mammogram? Yes  ordered Up to date of Bone Density/Dexa?  Yes Colorectal: up to date   Additional Screenings:  Hepatitis C Screening: not indicated at this time.      Plan:    I have personally reviewed and addressed the Medicare Annual Wellness questionnaire and have noted the following in the patient's chart:  A. Medical and social history B. Use of alcohol, tobacco or illicit drugs  C. Current medications and supplements D. Functional ability and status E.  Nutritional status F.  Physical activity G. Advance directives H. List of other physicians I.  Hospitalizations, surgeries, and ER visits in previous 12 months J.  [place] such as hearing and vision if needed, cognitive and depression L. Referrals and appointments   In addition, I have reviewed and discussed with patient certain preventive protocols, quality metrics, and best practice recommendations. A written personalized care plan for preventive services as well as general preventive health recommendations were provided to patient.   Signed,  Tyler Aas, LPN Nurse Health Advisor   Nurse Notes: none

## 2018-01-23 LAB — CBC WITH DIFFERENTIAL/PLATELET
Basophils Absolute: 0 10*3/uL (ref 0.0–0.2)
Basos: 1 %
EOS (ABSOLUTE): 0.2 10*3/uL (ref 0.0–0.4)
EOS: 3 %
Hematocrit: 43.5 % (ref 34.0–46.6)
Hemoglobin: 14 g/dL (ref 11.1–15.9)
IMMATURE GRANULOCYTES: 1 %
Immature Grans (Abs): 0 10*3/uL (ref 0.0–0.1)
LYMPHS: 18 %
Lymphocytes Absolute: 1.1 10*3/uL (ref 0.7–3.1)
MCH: 28.2 pg (ref 26.6–33.0)
MCHC: 32.2 g/dL (ref 31.5–35.7)
MCV: 88 fL (ref 79–97)
MONOS ABS: 0.5 10*3/uL (ref 0.1–0.9)
Monocytes: 8 %
Neutrophils Absolute: 4.5 10*3/uL (ref 1.4–7.0)
Neutrophils: 69 %
PLATELETS: 303 10*3/uL (ref 150–379)
RBC: 4.96 x10E6/uL (ref 3.77–5.28)
RDW: 14.4 % (ref 12.3–15.4)
WBC: 6.3 10*3/uL (ref 3.4–10.8)

## 2018-01-23 LAB — COMPREHENSIVE METABOLIC PANEL
ALT: 22 IU/L (ref 0–32)
AST: 14 IU/L (ref 0–40)
Albumin/Globulin Ratio: 2.1 (ref 1.2–2.2)
Albumin: 4.2 g/dL (ref 3.5–4.8)
Alkaline Phosphatase: 61 IU/L (ref 39–117)
BUN/Creatinine Ratio: 15 (ref 12–28)
BUN: 19 mg/dL (ref 8–27)
Bilirubin Total: 0.6 mg/dL (ref 0.0–1.2)
CALCIUM: 9.7 mg/dL (ref 8.7–10.3)
CO2: 22 mmol/L (ref 20–29)
CREATININE: 1.24 mg/dL — AB (ref 0.57–1.00)
Chloride: 101 mmol/L (ref 96–106)
GFR calc Af Amer: 50 mL/min/{1.73_m2} — ABNORMAL LOW (ref >59)
GFR, EST NON AFRICAN AMERICAN: 44 mL/min/{1.73_m2} — AB (ref >59)
Globulin, Total: 2 g/dL (ref 1.5–4.5)
Glucose: 164 mg/dL — ABNORMAL HIGH (ref 65–99)
Potassium: 4.2 mmol/L (ref 3.5–5.2)
Sodium: 142 mmol/L (ref 134–144)
Total Protein: 6.2 g/dL (ref 6.0–8.5)

## 2018-01-23 LAB — LIPID PANEL W/O CHOL/HDL RATIO
Cholesterol, Total: 211 mg/dL — ABNORMAL HIGH (ref 100–199)
HDL: 49 mg/dL (ref >39)
LDL CALC: 132 mg/dL — AB (ref 0–99)
TRIGLYCERIDES: 151 mg/dL — AB (ref 0–149)
VLDL Cholesterol Cal: 30 mg/dL (ref 5–40)

## 2018-01-23 LAB — TSH: TSH: 4.08 u[IU]/mL (ref 0.450–4.500)

## 2018-01-24 ENCOUNTER — Telehealth: Payer: Self-pay | Admitting: Family Medicine

## 2018-01-24 DIAGNOSIS — M4696 Unspecified inflammatory spondylopathy, lumbar region: Secondary | ICD-10-CM | POA: Insufficient documentation

## 2018-01-24 DIAGNOSIS — Z79899 Other long term (current) drug therapy: Secondary | ICD-10-CM | POA: Insufficient documentation

## 2018-01-24 DIAGNOSIS — N289 Disorder of kidney and ureter, unspecified: Secondary | ICD-10-CM

## 2018-01-24 NOTE — Telephone Encounter (Signed)
Please let patient know that her labs look really good, but her kidney function went up a bit from the last time we checked it. This can be from her medicine or it can be that she was a bit dehydrated when we checked her. I'd like her to drink a whole lot of water and come in in about a week to just repeat her kidney function. No appointment needed. Order in.

## 2018-01-24 NOTE — Assessment & Plan Note (Signed)
For Tramadol 01/22/18- see scanned document.

## 2018-01-24 NOTE — Assessment & Plan Note (Addendum)
Worsening. Will obtain x-rays to make sure nothing has changed.  Starting to have some increased pain even with sitting. Pain management didn't help. Will consider PT. Will start PRN tramadol- to take a pill 2-3x a week. Controlled substance agreement signed today. Rx for tramadol given today. Call with any concerns.

## 2018-01-24 NOTE — Assessment & Plan Note (Signed)
A1c under better control at 7.0. Continue current regimen. Continue to monitor. Call with any concerns. Recheck 3 months.

## 2018-01-24 NOTE — Assessment & Plan Note (Signed)
Under good control on current regimen. Continue to monitor. Call with any concerns. Continue current regimen. Refills given today.

## 2018-01-24 NOTE — Assessment & Plan Note (Signed)
Worsening. Will obtain x-rays to make sure nothing has changed.  Starting to have some increased pain even with sitting. Pain management didn't help. Will consider PT. Will start PRN tramadol- to take a pill 2-3x a week. Controlled substance agreement signed today. Rx for tramadol given today. Call with any concerns.

## 2018-01-24 NOTE — Assessment & Plan Note (Signed)
Stable. Continue current regimen. Continue to monitor. Call with any concerns.  

## 2018-01-24 NOTE — Assessment & Plan Note (Signed)
Rechecking levels today. Await results and treat as needed. Call with any concerns.   

## 2018-01-26 NOTE — Telephone Encounter (Signed)
Called patient, no answer, unable to leave a message, will try again.   

## 2018-01-26 NOTE — Telephone Encounter (Signed)
Check on test strips, tramadol and bp meds

## 2018-01-27 ENCOUNTER — Other Ambulatory Visit: Payer: Self-pay | Admitting: Family Medicine

## 2018-01-27 MED ORDER — TRAMADOL HCL 50 MG PO TABS: 50.0000 mg | ORAL_TABLET | Freq: Four times a day (QID) | ORAL | 0 refills | Status: DC | PRN

## 2018-01-27 NOTE — Telephone Encounter (Signed)
Called patient no answer, unable to leave a message, will try again.

## 2018-01-27 NOTE — Telephone Encounter (Signed)
Rx up front for her. If she finds the other one, just have her bring it in.

## 2018-01-27 NOTE — Telephone Encounter (Signed)
The pharmacy does not have the patients Tramadol prescription, and I do not see it in the front office, what would you like me to do?

## 2018-01-27 NOTE — Telephone Encounter (Signed)
Patient notified

## 2018-01-29 LAB — UA/M W/RFLX CULTURE, ROUTINE

## 2018-01-29 LAB — BAYER DCA HB A1C WAIVED: HB A1C (BAYER DCA - WAIVED): 7 % — ABNORMAL HIGH (ref <7.0)

## 2018-02-03 ENCOUNTER — Telehealth: Payer: Self-pay

## 2018-02-03 MED ORDER — HYDROCHLOROTHIAZIDE 25 MG PO TABS: 25.0000 mg | ORAL_TABLET | Freq: Every day | ORAL | 1 refills | Status: DC

## 2018-02-03 MED ORDER — BENAZEPRIL HCL 20 MG PO TABS: 20.0000 mg | ORAL_TABLET | Freq: Every day | ORAL | 1 refills | Status: DC

## 2018-02-03 NOTE — Telephone Encounter (Signed)
Benazepril/ HCTZ 20/25mg  d/c by manufacturer, please separate medication  into two different ones or send a different medication in

## 2018-02-03 NOTE — Telephone Encounter (Signed)
Med broken up and sent to her pharmacy

## 2018-04-27 ENCOUNTER — Ambulatory Visit: Payer: Medicare Other | Admitting: Family Medicine

## 2018-05-12 ENCOUNTER — Ambulatory Visit: Payer: Medicare Other | Admitting: Family Medicine

## 2018-05-26 ENCOUNTER — Other Ambulatory Visit: Payer: Self-pay | Admitting: Family Medicine

## 2018-05-26 DIAGNOSIS — Z1231 Encounter for screening mammogram for malignant neoplasm of breast: Secondary | ICD-10-CM

## 2018-05-29 ENCOUNTER — Ambulatory Visit: Payer: Self-pay | Admitting: Family Medicine

## 2018-06-17 NOTE — Progress Notes (Signed)
Needs DM Eye Exam

## 2018-06-23 LAB — HM DIABETES EYE EXAM

## 2018-07-10 ENCOUNTER — Ambulatory Visit: Payer: Medicare Other | Admitting: Family Medicine

## 2018-07-10 ENCOUNTER — Encounter: Payer: Self-pay | Admitting: Family Medicine

## 2018-07-10 VITALS — BP 111/62 | HR 64 | Ht 61.0 in | Wt 234.4 lb

## 2018-07-10 DIAGNOSIS — I129 Hypertensive chronic kidney disease with stage 1 through stage 4 chronic kidney disease, or unspecified chronic kidney disease: Secondary | ICD-10-CM

## 2018-07-10 DIAGNOSIS — R7989 Other specified abnormal findings of blood chemistry: Secondary | ICD-10-CM

## 2018-07-10 DIAGNOSIS — E782 Mixed hyperlipidemia: Secondary | ICD-10-CM | POA: Diagnosis not present

## 2018-07-10 DIAGNOSIS — N182 Chronic kidney disease, stage 2 (mild): Secondary | ICD-10-CM

## 2018-07-10 DIAGNOSIS — E1122 Type 2 diabetes mellitus with diabetic chronic kidney disease: Secondary | ICD-10-CM | POA: Diagnosis not present

## 2018-07-10 LAB — UA/M W/RFLX CULTURE, ROUTINE
BILIRUBIN UA: NEGATIVE
Glucose, UA: NEGATIVE
KETONES UA: NEGATIVE
NITRITE UA: NEGATIVE
Protein, UA: NEGATIVE
RBC UA: NEGATIVE
SPEC GRAV UA: 1.02 (ref 1.005–1.030)
Urobilinogen, Ur: 0.2 mg/dL (ref 0.2–1.0)
pH, UA: 5 (ref 5.0–7.5)

## 2018-07-10 LAB — MICROSCOPIC EXAMINATION

## 2018-07-10 LAB — BAYER DCA HB A1C WAIVED: HB A1C (BAYER DCA - WAIVED): 7 % — ABNORMAL HIGH (ref <7.0)

## 2018-07-10 LAB — MICROALBUMIN, URINE WAIVED
Creatinine, Urine Waived: 300 mg/dL (ref 10–300)
Microalb, Ur Waived: 30 mg/L — ABNORMAL HIGH (ref 0–19)
Microalb/Creat Ratio: <30 mg/g (ref <30)

## 2018-07-10 MED ORDER — HYDROCHLOROTHIAZIDE 25 MG PO TABS: 25.0000 mg | ORAL_TABLET | Freq: Every day | ORAL | 1 refills | Status: DC

## 2018-07-10 MED ORDER — METFORMIN HCL ER 500 MG PO TB24: 1000.0000 mg | ORAL_TABLET | Freq: Two times a day (BID) | ORAL | 1 refills | Status: DC

## 2018-07-10 MED ORDER — BENAZEPRIL HCL 20 MG PO TABS: 20.0000 mg | ORAL_TABLET | Freq: Every day | ORAL | 1 refills | Status: DC

## 2018-07-10 NOTE — Assessment & Plan Note (Signed)
Under good control on current regimen with A1c of 7.0. Continue current regimen. Continue to monitor. Call with any concerns. Refills given. Refuses statin.

## 2018-07-10 NOTE — Progress Notes (Signed)
BP (!) 143/80 (BP Location: Left Arm, Cuff Size: Large)   Pulse 86   Ht 5\' 1"  (1.549 m)   Wt 234 lb 7 oz (106.3 kg)   SpO2 95%   BMI 44.30 kg/m    Subjective:    Patient ID: Karen Farley, female    DOB: [birth year], 72 y.o.   MRN: [id number]  HPI: Karen Farley is a 72 y.o. female  Chief Complaint  Patient presents with  . Diabetes   DIABETES Hypoglycemic episodes:no Polydipsia/polyuria: no Visual disturbance: no Chest pain: no Paresthesias: no Glucose Monitoring: yes  Accucheck frequency: Daily Taking Insulin?: no Blood Pressure Monitoring: not checking Retinal Examination: Up to date Foot Exam: Up to Date Diabetic Education: Completed Pneumovax: Up to Date Influenza: REfused Aspirin: no  HYPERTENSION / [place] Satisfied with current treatment? yes Duration of hypertension: chronic BP monitoring frequency: not checking BP range:  BP medication side effects: no Past BP meds:  Benazepril, hctz Duration of hyperlipidemia: chronic Cholesterol medication side effects: not on anything Cholesterol supplements: none Past cholesterol medications: none Medication compliance: excellent compliance Aspirin: no Recent stressors: no Recurrent headaches: no Visual changes: no Palpitations: no Dyspnea: no Chest pain: no Lower extremity edema: no Dizzy/lightheaded: no  HYPOTHYROIDISM Thyroid control status:controlled Satisfied with current treatment? yes Medication side effects: no Medication compliance: excellent compliance Recent dose adjustment:no Fatigue: no Cold intolerance: no Heat intolerance: no Weight gain: no Weight loss: no Constipation: no Diarrhea/loose stools: no Palpitations: no Lower extremity edema: no Anxiety/depressed mood: no  Relevant past medical, surgical, family and social history reviewed and updated as indicated. Interim medical history since our last visit reviewed. Allergies and medications reviewed and  updated.  Review of Systems  Constitutional: Negative.   Respiratory: Negative.   Cardiovascular: Negative.   Neurological: Negative.   Psychiatric/Behavioral: Negative.     Per HPI unless specifically indicated above     Objective:    BP (!) 143/80 (BP Location: Left Arm, Cuff Size: Large)   Pulse 86   Ht 5\' 1"  (1.549 m)   Wt 234 lb 7 oz (106.3 kg)   SpO2 95%   BMI 44.30 kg/m   Wt Readings from Last 3 Encounters:  07/10/18 234 lb 7 oz (106.3 kg)  01/22/18 237 lb 2 oz (107.6 kg)  01/22/18 237 lb 2 oz (107.6 kg)    Physical Exam  Constitutional: She is oriented to person, place, and time. She appears well-developed and well-nourished. No distress.  HENT:  Head: Normocephalic and atraumatic.  Right Ear: Hearing normal.  Left Ear: Hearing normal.  Nose: Nose normal.  Eyes: Conjunctivae and lids are normal. Right eye exhibits no discharge. Left eye exhibits no discharge. No scleral icterus.  Cardiovascular: Normal rate, regular rhythm, normal heart sounds and intact distal pulses. Exam reveals no gallop and no friction rub.  No murmur heard. Pulmonary/Chest: Effort normal and breath sounds normal. No stridor. No respiratory distress. She has no wheezes. She has no rales. She exhibits no tenderness.  Musculoskeletal: Normal range of motion.  Neurological: She is alert and oriented to person, place, and time.  Skin: Skin is warm, dry and intact. Capillary refill takes less than 2 seconds. No rash noted. She is not diaphoretic. No erythema. No pallor.  Psychiatric: She has a normal mood and affect. Her speech is normal and behavior is normal. Judgment and thought content normal. Cognition and memory are normal.  Nursing note and vitals reviewed.   Results for orders placed or performed  in visit on 01/22/18  Bayer DCA Hb A1c Waived  Result Value Ref Range   HB A1C (BAYER DCA - WAIVED) 7.0 (H) <7.0 %  CBC with Differential/Platelet  Result Value Ref Range   WBC 6.3 3.4 -  10.8 x10E3/uL   RBC 4.96 3.77 - 5.28 x10E6/uL   Hemoglobin 14.0 11.1 - 15.9 g/dL   Hematocrit 43.5 34.0 - 46.6 %   MCV 88 79 - 97 fL   MCH 28.2 26.6 - 33.0 pg   MCHC 32.2 31.5 - 35.7 g/dL   RDW 14.4 12.3 - 15.4 %   Platelets 303 150 - 379 x10E3/uL   Neutrophils 69 Not Estab. %   Lymphs 18 Not Estab. %   Monocytes 8 Not Estab. %   Eos 3 Not Estab. %   Basos 1 Not Estab. %   Neutrophils Absolute 4.5 1.4 - 7.0 x10E3/uL   Lymphocytes Absolute 1.1 0.7 - 3.1 x10E3/uL   Monocytes Absolute 0.5 0.1 - 0.9 x10E3/uL   EOS (ABSOLUTE) 0.2 0.0 - 0.4 x10E3/uL   Basophils Absolute 0.0 0.0 - 0.2 x10E3/uL   Immature Granulocytes 1 Not Estab. %   Immature Grans (Abs) 0.0 0.0 - 0.1 x10E3/uL  Comprehensive metabolic panel  Result Value Ref Range   Glucose 164 (H) 65 - 99 mg/dL   BUN 19 8 - 27 mg/dL   Creatinine, Ser 1.24 (H) 0.57 - 1.00 mg/dL   GFR calc non Af Amer 44 (L) >59 mL/min/1.73   GFR calc Af Amer 50 (L) >59 mL/min/1.73   BUN/Creatinine Ratio 15 12 - 28   Sodium 142 134 - 144 mmol/L   Potassium 4.2 3.5 - 5.2 mmol/L   Chloride 101 96 - 106 mmol/L   CO2 22 20 - 29 mmol/L   Calcium 9.7 8.7 - 10.3 mg/dL   Total Protein 6.2 6.0 - 8.5 g/dL   Albumin 4.2 3.5 - 4.8 g/dL   Globulin, Total 2.0 1.5 - 4.5 g/dL   Albumin/Globulin Ratio 2.1 1.2 - 2.2   Bilirubin Total 0.6 0.0 - 1.2 mg/dL   Alkaline Phosphatase 61 39 - 117 IU/L   AST 14 0 - 40 IU/L   ALT 22 0 - 32 IU/L  Lipid Panel w/o Chol/HDL Ratio  Result Value Ref Range   Cholesterol, Total 211 (H) 100 - 199 mg/dL   Triglycerides 151 (H) 0 - 149 mg/dL   HDL 49 >39 mg/dL   VLDL Cholesterol Cal 30 5 - 40 mg/dL   LDL Calculated 132 (H) 0 - 99 mg/dL  Microalbumin, Urine Waived  Result Value Ref Range   Microalb, Ur Waived WILL FOLLOW    Creatinine, Urine Waived WILL FOLLOW    Microalb/Creat Ratio WILL FOLLOW   TSH  Result Value Ref Range   TSH 4.080 0.450 - 4.500 uIU/mL  UA/M w/rflx Culture, Routine  Result Value Ref Range   Specific  Gravity, UA CANCELED    pH, UA CANCELED    Protein, UA CANCELED    Glucose, UA CANCELED    Ketones, UA CANCELED       Assessment & Plan:   Problem List Items Addressed This Visit      Endocrine   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 Continue current regimen. Continue to monitor. Call with any concerns. Refills given. Refuses statin.        Relevant Medications   benazepril (LOTENSIN) 20  MG tablet   metFORMIN (GLUCOPHAGE XR) 500 MG 24 hr tablet   Other Relevant Orders   Bayer DCA Hb A1c Waived   CBC with Differential/Platelet   Comprehensive metabolic panel   Microalbumin, Urine Waived   UA/M w/rflx Culture, Routine   CKD stage 2 due to type 2 diabetes mellitus ([place])    Under good control on current regimen. Continue current regimen. Continue to monitor. Call with any concerns. Refills given.        Relevant Medications   benazepril (LOTENSIN) 20 MG tablet   metFORMIN (GLUCOPHAGE XR) 500 MG 24 hr tablet   Other Relevant Orders   CBC with Differential/Platelet   Comprehensive metabolic panel   UA/M w/rflx Culture, Routine     Genitourinary   Benign hypertensive renal disease    Under good control on current regimen. Continue current regimen. Continue to monitor. Call with any concerns. Refills given.        Relevant Orders   CBC with Differential/Platelet   Comprehensive metabolic panel   Microalbumin, Urine Waived   UA/M w/rflx Culture, Routine     Other   Hyperlipidemia    Under good control on current regimen. Continue current regimen. Continue to monitor. Call with any concerns. Refills given.        Relevant Medications   benazepril (LOTENSIN) 20 MG tablet   hydrochlorothiazide (HYDRODIURIL) 25 MG tablet   Other Relevant Orders   CBC with Differential/Platelet   Comprehensive metabolic panel   Lipid Panel w/o Chol/HDL Ratio   UA/M w/rflx Culture, Routine   Abnormal  thyroid blood test    Rechecking levels today. Will treat as needed. Call with any concerns.       Relevant Orders   CBC with Differential/Platelet   Comprehensive metabolic panel   TSH   UA/M w/rflx Culture, Routine       Follow up plan: Return in about 3 months (around 10/09/2018) for Follow up DM.

## 2018-07-10 NOTE — Assessment & Plan Note (Signed)
Under good control on current regimen. Continue current regimen. Continue to monitor. Call with any concerns. Refills given.   

## 2018-07-10 NOTE — Assessment & Plan Note (Signed)
Rechecking levels today. Will treat as needed. Call with any concerns.  

## 2018-07-11 LAB — CBC WITH DIFFERENTIAL/PLATELET
BASOS: 1 %
Basophils Absolute: 0 10*3/uL (ref 0.0–0.2)
EOS (ABSOLUTE): 0.3 10*3/uL (ref 0.0–0.4)
EOS: 5 %
HEMOGLOBIN: 14.5 g/dL (ref 11.1–15.9)
Hematocrit: 43.7 % (ref 34.0–46.6)
IMMATURE GRANS (ABS): 0 10*3/uL (ref 0.0–0.1)
IMMATURE GRANULOCYTES: 1 %
LYMPHS: 20 %
Lymphocytes Absolute: 1.3 10*3/uL (ref 0.7–3.1)
MCH: 29.4 pg (ref 26.6–33.0)
MCHC: 33.2 g/dL (ref 31.5–35.7)
MCV: 89 fL (ref 79–97)
MONOCYTES: 6 %
Monocytes Absolute: 0.4 10*3/uL (ref 0.1–0.9)
NEUTROS ABS: 4.3 10*3/uL (ref 1.4–7.0)
NEUTROS PCT: 67 %
Platelets: 303 10*3/uL (ref 150–450)
RBC: 4.93 x10E6/uL (ref 3.77–5.28)
RDW: 15.1 % (ref 12.3–15.4)
WBC: 6.3 10*3/uL (ref 3.4–10.8)

## 2018-07-11 LAB — LIPID PANEL W/O CHOL/HDL RATIO
Cholesterol, Total: 233 mg/dL — ABNORMAL HIGH (ref 100–199)
HDL: 49 mg/dL (ref >39)
LDL Calculated: 150 mg/dL — ABNORMAL HIGH (ref 0–99)
Triglycerides: 172 mg/dL — ABNORMAL HIGH (ref 0–149)
VLDL CHOLESTEROL CAL: 34 mg/dL (ref 5–40)

## 2018-07-11 LAB — COMPREHENSIVE METABOLIC PANEL
A/G RATIO: 2 (ref 1.2–2.2)
ALBUMIN: 4.3 g/dL (ref 3.5–4.8)
ALT: 22 IU/L (ref 0–32)
AST: 17 IU/L (ref 0–40)
Alkaline Phosphatase: 64 IU/L (ref 39–117)
BILIRUBIN TOTAL: 0.5 mg/dL (ref 0.0–1.2)
BUN / CREAT RATIO: 16 (ref 12–28)
BUN: 21 mg/dL (ref 8–27)
CALCIUM: 9.6 mg/dL (ref 8.7–10.3)
CO2: 25 mmol/L (ref 20–29)
Chloride: 97 mmol/L (ref 96–106)
Creatinine, Ser: 1.3 mg/dL — ABNORMAL HIGH (ref 0.57–1.00)
GFR calc Af Amer: 47 mL/min/{1.73_m2} — ABNORMAL LOW (ref >59)
GFR, EST NON AFRICAN AMERICAN: 41 mL/min/{1.73_m2} — AB (ref >59)
Globulin, Total: 2.1 g/dL (ref 1.5–4.5)
Glucose: 164 mg/dL — ABNORMAL HIGH (ref 65–99)
POTASSIUM: 4.4 mmol/L (ref 3.5–5.2)
Sodium: 140 mmol/L (ref 134–144)
Total Protein: 6.4 g/dL (ref 6.0–8.5)

## 2018-07-11 LAB — TSH: TSH: 1.93 u[IU]/mL (ref 0.450–4.500)

## 2018-07-13 ENCOUNTER — Encounter: Payer: Self-pay | Admitting: Family Medicine

## 2018-07-22 ENCOUNTER — Ambulatory Visit
Admission: RE | Admit: 2018-07-22 | Discharge: 2018-07-22 | Disposition: A | Discharge status: Home / Self Care | Payer: Medicare Other | Source: Ambulatory Visit | Attending: Family Medicine | Admitting: Family Medicine

## 2018-07-22 DIAGNOSIS — Z1231 Encounter for screening mammogram for malignant neoplasm of breast: Secondary | ICD-10-CM | POA: Diagnosis present

## 2018-07-23 ENCOUNTER — Encounter: Payer: Self-pay | Admitting: Family Medicine

## 2018-08-13 ENCOUNTER — Other Ambulatory Visit: Payer: Self-pay | Admitting: Family Medicine

## 2018-09-23 ENCOUNTER — Telehealth: Payer: Self-pay

## 2018-09-23 NOTE — Telephone Encounter (Signed)
Opened in error

## 2018-09-24 ENCOUNTER — Telehealth: Payer: Self-pay | Admitting: Family Medicine

## 2018-09-24 NOTE — Telephone Encounter (Signed)
Patient refuses statin. Has been discussed

## 2018-09-24 NOTE — Telephone Encounter (Signed)
-----  Message from Karen Farley, [place] sent at 09/23/2018 12:58 PM EST ----- Per [facility] metric guidelines patient is identified as a patient who met the diabetic criteria but who has not received a statin fill. Please review patients need/or lack there of and document according.

## 2018-10-09 ENCOUNTER — Ambulatory Visit: Payer: Medicare Other | Admitting: Family Medicine

## 2019-01-21 ENCOUNTER — Telehealth: Payer: Self-pay | Admitting: Family Medicine

## 2019-01-21 NOTE — Telephone Encounter (Signed)
Called pt to reschedule physical her husband stated that she was not in. Advised for her to call us back.

## 2019-01-25 ENCOUNTER — Encounter: Payer: Medicare Other | Admitting: Family Medicine

## 2019-02-02 ENCOUNTER — Other Ambulatory Visit: Payer: Self-pay | Admitting: Family Medicine

## 2019-02-09 ENCOUNTER — Other Ambulatory Visit: Payer: Self-pay | Admitting: Family Medicine

## 2019-02-09 NOTE — Telephone Encounter (Signed)
Requested Prescriptions  Pending Prescriptions Disp Refills  . metFORMIN (GLUCOPHAGE-XR) 500 MG 24 hr tablet [Pharmacy Med Name: METFORMIN ER 500MG 24HR TABS] 360 tablet 1    Sig: TAKE 2 TABLETS(1000 MG) BY MOUTH TWICE DAILY     Endocrinology:  Diabetes - Biguanides Failed - 02/09/2019  6:58 AM      Failed - Cr in normal range and within 360 days    Creatinine, Ser  Date Value Ref Range Status  07/10/2018 1.30 (H) 0.57 - 1.00 mg/dL Final         Failed - HBA1C is between 0 and 7.9 and within 180 days    Hemoglobin A1C  Date Value Ref Range Status  06/28/2016 6.6  Final   HB A1C (BAYER DCA - WAIVED)  Date Value Ref Range Status  07/10/2018 7.0 (H) <7.0 % Final    Comment:                                          Diabetic Adult            <7.0                                       Healthy Adult        4.3 - 5.7                                                           (DCCT/NGSP) American Diabetes Association's Summary of Glycemic Recommendations for Adults with Diabetes: Hemoglobin A1c <7.0%. More stringent glycemic goals (A1c <6.0%) may further reduce complications at the cost of increased risk of hypoglycemia.          Failed - eGFR in normal range and within 360 days    GFR calc Af Amer  Date Value Ref Range Status  07/10/2018 47 (L) >59 mL/min/1.73 Final   GFR calc non Af Amer  Date Value Ref Range Status  07/10/2018 41 (L) >59 mL/min/1.73 Final         Failed - Valid encounter within last 6 months    Recent Outpatient Visits          7 months ago Type 2 diabetes mellitus with stage 2 chronic kidney disease, without long-term current use of insulin ([place])   [place], [name], DO   1 year ago Routine general medical examination at a health care facility   [facility], [place] P, DO   1 year ago Type 2 diabetes mellitus with stage 2 chronic kidney disease, without long-term current use of insulin ([place])   [place], [name], DO   1 year ago Type 2 diabetes mellitus with stage 2 chronic kidney disease, without long-term current use of insulin ([place])   [place], [place], DO   2 years ago Medicare annual wellness visit, subsequent   [employer], [place], DO      Future Appointments            In 2 months Wynetta Emery, Barb Merino, DO [employer], [facility]         .  hydrochlorothiazide (HYDRODIURIL) 25 MG tablet [Pharmacy Med Name: HYDROCHLOROTHIAZIDE 25MG TABLETS] 90 tablet 1    Sig: TAKE 1 TABLET(25 MG) BY MOUTH DAILY     Cardiovascular: Diuretics - Thiazide Failed - 02/09/2019  6:58 AM      Failed - Cr in normal range and within 360 days    Creatinine, Ser  Date Value Ref Range Status  07/10/2018 1.30 (H) 0.57 - 1.00 mg/dL Final         Failed - Valid encounter within last 6 months    Recent Outpatient Visits          7 months ago Type 2 diabetes mellitus with stage 2 chronic kidney disease, without long-term current use of insulin ([place])   [place], [name], DO   1 year ago Routine general medical examination at a health care facility   [facility], [place] P, DO   1 year ago Type 2 diabetes mellitus with stage 2 chronic kidney disease, without long-term current use of insulin ([place])   [place], [name], DO   1 year ago Type 2 diabetes mellitus with stage 2 chronic kidney disease, without long-term current use of insulin ([place])   [place], [place], DO   2 years ago Medicare annual wellness visit, subsequent   [employer], [place], DO      Future Appointments            In 2 months Johnson, [name], DO [place], [place] in normal range and within 360 days    Calcium  Date Value Ref Range Status  07/10/2018 9.6 8.7 - 10.3 mg/dL Final         Passed - K in normal range and within  360 days    Potassium  Date Value Ref Range Status  07/10/2018 4.4 3.5 - 5.2 mmol/L Final         Passed - Na in normal range and within 360 days    Sodium  Date Value Ref Range Status  07/10/2018 140 134 - 144 mmol/L Final         Passed - Last BP in normal range    BP Readings from Last 1 Encounters:  07/10/18 111/62       . benazepril (LOTENSIN) 20 MG tablet [Pharmacy Med Name: BENAZEPRIL 20MG TABLETS] 90 tablet 1    Sig: TAKE 1 TABLET(20 MG) BY MOUTH DAILY     Cardiovascular:  ACE Inhibitors Failed - 02/09/2019  6:58 AM      Failed - Cr in normal range and within 180 days    Creatinine, Ser  Date Value Ref Range Status  07/10/2018 1.30 (H) 0.57 - 1.00 mg/dL Final         Failed - K in normal range and within 180 days    Potassium  Date Value Ref Range Status  07/10/2018 4.4 3.5 - 5.2 mmol/L Final         Failed - Valid encounter within last 6 months    Recent Outpatient Visits          7 months ago Type 2 diabetes mellitus with stage 2 chronic kidney disease, without long-term current use of insulin ([place])   [place], [name], DO   1 year ago Routine general medical examination at a health care facility   [facility] [place], [name], DO   1 year ago Type 2 diabetes mellitus with stage 2 chronic kidney disease, without long-term current use of insulin ([place])   [place], [name], DO   1 year ago Type 2 diabetes mellitus with stage 2 chronic kidney disease, without long-term current use of insulin ([place])   [place], [place], DO   2 years ago [employer] annual wellness visit, subsequent   [employer], [place], DO      Future Appointments            In 2 months Johnson, [name], DO [employer], [place] - Patient is not pregnant      Passed - Last BP in normal range    BP Readings from Last 1 Encounters:  07/10/18 111/62

## 2019-02-11 ENCOUNTER — Other Ambulatory Visit: Payer: Self-pay | Admitting: Family Medicine

## 2019-04-27 ENCOUNTER — Other Ambulatory Visit: Payer: Self-pay

## 2019-04-27 ENCOUNTER — Encounter: Payer: Self-pay | Admitting: Family Medicine

## 2019-04-27 ENCOUNTER — Ambulatory Visit: Payer: Medicare Other | Admitting: Family Medicine

## 2019-04-27 VITALS — BP 123/100 | HR 68 | Temp 98.0°F | Wt 229.6 lb

## 2019-04-27 DIAGNOSIS — E1122 Type 2 diabetes mellitus with diabetic chronic kidney disease: Secondary | ICD-10-CM

## 2019-04-27 DIAGNOSIS — Z1159 Encounter for screening for other viral diseases: Secondary | ICD-10-CM

## 2019-04-27 DIAGNOSIS — Z0000 Encounter for general adult medical examination without abnormal findings: Secondary | ICD-10-CM | POA: Diagnosis not present

## 2019-04-27 DIAGNOSIS — I129 Hypertensive chronic kidney disease with stage 1 through stage 4 chronic kidney disease, or unspecified chronic kidney disease: Secondary | ICD-10-CM

## 2019-04-27 DIAGNOSIS — M4696 Unspecified inflammatory spondylopathy, lumbar region: Secondary | ICD-10-CM

## 2019-04-27 DIAGNOSIS — E782 Mixed hyperlipidemia: Secondary | ICD-10-CM

## 2019-04-27 DIAGNOSIS — R7989 Other specified abnormal findings of blood chemistry: Secondary | ICD-10-CM

## 2019-04-27 DIAGNOSIS — N182 Chronic kidney disease, stage 2 (mild): Secondary | ICD-10-CM

## 2019-04-27 MED ORDER — HYDROCHLOROTHIAZIDE 25 MG PO TABS: ORAL_TABLET | ORAL | 1 refills | Status: DC

## 2019-04-27 MED ORDER — TRAMADOL HCL 50 MG PO TABS: 50.0000 mg | ORAL_TABLET | Freq: Four times a day (QID) | ORAL | 1 refills | Status: DC | PRN

## 2019-04-27 MED ORDER — BENAZEPRIL HCL 20 MG PO TABS: ORAL_TABLET | ORAL | 1 refills | Status: DC

## 2019-04-27 MED ORDER — METFORMIN HCL ER 500 MG PO TB24: ORAL_TABLET | ORAL | 1 refills | Status: DC

## 2019-04-27 NOTE — Assessment & Plan Note (Signed)
Stable. Takes a pill 2-3x a week. 30 pills given with 1 refill, should last 6 months, will call for appointment if out before scheduled follow up.

## 2019-04-27 NOTE — Patient Instructions (Addendum)
Preventative Services:  Health Risk Assessment and Personalized Prevention Plan: Done today Bone Mass Measurements: up to date Breast Cancer Screening: up to date CVD Screening:  Done today Cervical Cancer Screening: N/A Colon Cancer Screening: Declined Depression Screening: Done today Diabetes Screening: Done today Glaucoma Screening: See your eye doctor Hepatitis B vaccine: N/A Hepatitis C screening: Done today HIV Screening: N/A Flu Vaccine: In the Fall Lung cancer Screening: N/A Obesity Screening: Done today Pneumonia Vaccines (2): Up to date STI Screening: N/A   Health Maintenance for Postmenopausal Women Menopause is a normal process in which your ability to get pregnant comes to an end. This process happens slowly over many months or years, usually between the ages of 34 and 71. Menopause is complete when you have missed your menstrual periods for 12 months. It is important to talk with your health care provider about some of the most common conditions that affect women after menopause (postmenopausal women). These include heart disease, cancer, and bone loss (osteoporosis). Adopting a healthy lifestyle and getting preventive care can help to promote your health and wellness. The actions you take can also lower your chances of developing some of these common conditions. What should I know about menopause? During menopause, you may get a number of symptoms, such as:  Hot flashes. These can be moderate or severe.  Night sweats.  Decrease in sex drive.  Mood swings.  Headaches.  Tiredness.  Irritability.  Memory problems.  Insomnia. Choosing to treat or not to treat these symptoms is a decision that you make with your health care provider. Do I need hormone replacement therapy?  Hormone replacement therapy is effective in treating symptoms that are caused by menopause, such as hot flashes and night sweats.  Hormone replacement carries certain risks, especially as  you become older. If you are thinking about using estrogen or estrogen with progestin, discuss the benefits and risks with your health care provider. What is my risk for heart disease and stroke? The risk of heart disease, heart attack, and stroke increases as you age. One of the causes may be a change in the body's hormones during menopause. This can affect how your body uses dietary fats, triglycerides, and cholesterol. Heart attack and stroke are medical emergencies. There are many things that you can do to help prevent heart disease and stroke. Watch your blood pressure  High blood pressure causes heart disease and increases the risk of stroke. This is more likely to develop in people who have high blood pressure readings, are of African descent, or are overweight.  Have your blood pressure checked: ? Every 3-5 years if you are 32-109 years of age. ? Every year if you are 31 years old or older. Eat a healthy diet   Eat a diet that includes plenty of vegetables, fruits, low-fat dairy products, and lean protein.  Do not eat a lot of foods that are high in solid fats, added sugars, or sodium. Get regular exercise Get regular exercise. This is one of the most important things you can do for your health. Most adults should:  Try to exercise for at least 150 minutes each week. The exercise should increase your heart rate and make you sweat (moderate-intensity exercise).  Try to do strengthening exercises at least twice each week. Do these in addition to the moderate-intensity exercise.  Spend less time sitting. Even light physical activity can be beneficial. Other tips  Work with your health care provider to achieve or maintain a healthy weight.  Do not use any products that contain nicotine or tobacco, such as cigarettes, e-cigarettes, and chewing tobacco. If you need help quitting, ask your health care provider.  Know your numbers. Ask your health care provider to check your cholesterol  and your blood sugar (glucose). Continue to have your blood tested as directed by your health care provider. Do I need screening for cancer? Depending on your health history and family history, you may need to have cancer screening at different stages of your life. This may include screening for:  Breast cancer.  Cervical cancer.  Lung cancer.  Colorectal cancer. What is my risk for osteoporosis? After menopause, you may be at increased risk for osteoporosis. Osteoporosis is a condition in which bone destruction happens more quickly than new bone creation. To help prevent osteoporosis or the bone fractures that can happen because of osteoporosis, you may take the following actions:  If you are 14-78 years old, get at least 1,000 mg of calcium and at least 600 mg of vitamin D per day.  If you are older than age 37 but younger than age 71, get at least 1,200 mg of calcium and at least 600 mg of vitamin D per day.  If you are older than age 26, get at least 1,200 mg of calcium and at least 800 mg of vitamin D per day. Smoking and drinking excessive alcohol increase the risk of osteoporosis. Eat foods that are rich in calcium and vitamin D, and do weight-bearing exercises several times each week as directed by your health care provider. How does menopause affect my mental health? Depression may occur at any age, but it is more common as you become older. Common symptoms of depression include:  Low or sad mood.  Changes in sleep patterns.  Changes in appetite or eating patterns.  Feeling an overall lack of motivation or enjoyment of activities that you previously enjoyed.  Frequent crying spells. Talk with your health care provider if you think that you are experiencing depression. General instructions See your health care provider for regular wellness exams and vaccines. This may include:  Scheduling regular health, dental, and eye exams.  Getting and maintaining your vaccines.  These include: ? Influenza vaccine. Get this vaccine each year before the flu season begins. ? Pneumonia vaccine. ? Shingles vaccine. ? Tetanus, diphtheria, and pertussis (Tdap) booster vaccine. Your health care provider may also recommend other immunizations. Tell your health care provider if you have ever been abused or do not feel safe at home. Summary  Menopause is a normal process in which your ability to get pregnant comes to an end.  This condition causes hot flashes, night sweats, decreased interest in sex, mood swings, headaches, or lack of sleep.  Treatment for this condition may include hormone replacement therapy.  Take actions to keep yourself healthy, including exercising regularly, eating a healthy diet, watching your weight, and checking your blood pressure and blood sugar levels.  Get screened for cancer and depression. Make sure that you are up to date with all your vaccines. This information is not intended to replace advice given to you by your health care provider. Make sure you discuss any questions you have with your health care provider. Document Released: 11/29/2005 Document Revised: 09/30/2018 Document Reviewed: 09/30/2018 Elsevier Patient Education  2020 [employer].

## 2019-04-27 NOTE — Progress Notes (Signed)
BP (!) 123/100   Pulse 68   Temp 98 F (36.7 C) (Oral)   Wt 229 lb 9.6 oz (104.1 kg)   SpO2 (!) 68%   BMI 43.38 kg/m    Subjective:    Patient ID: Karen Farley, female    DOB: 07/04/1946, 73 y.o.   MRN: [id number]  HPI: Karen Farley is a 73 y.o. female presenting on 04/27/2019 for comprehensive medical examination. Current medical complaints include:  HYPERTENSION / HYPERLIPIDEMIA Satisfied with current treatment? yes Duration of hypertension: chronic BP monitoring frequency: not checking BP medication side effects: no Past BP meds: benazepril, HCTZ Duration of hyperlipidemia: chronic Cholesterol medication side effects: not on anything Cholesterol supplements: none Past cholesterol medications: Refuses statin Medication compliance: excellent compliance Aspirin: no Recent stressors: no Recurrent headaches: no Visual changes: no Palpitations: no Dyspnea: no Chest pain: no Lower extremity edema: yes Dizzy/lightheaded: no  DIABETES Hypoglycemic episodes:no Polydipsia/polyuria: no Visual disturbance: no Chest pain: no Paresthesias: no Glucose Monitoring: no  Accucheck frequency: Not Checking Taking Insulin?: no Blood Pressure Monitoring: not checking Retinal Examination: Up to Date Foot Exam: done today Diabetic Education: Completed Pneumovax: Up to Date Influenza: Up to Date Aspirin: no   Back pain is about the same. Takes the tramadol about 2-3x a week, tries not to. Still has some left over. Generally feeling OK. Not other concerns at this time.   Menopausal Symptoms: no  Functional Status Survey: Is the patient deaf or have difficulty hearing?: No Does the patient have difficulty seeing, even when wearing glasses/contacts?: No Does the patient have difficulty concentrating, remembering, or making decisions?: No Does the patient have difficulty walking or climbing stairs?: No Does the patient have difficulty dressing or bathing?: No Does the  patient have difficulty doing errands alone such as visiting a doctor's office or shopping?: No  Fall Risk  04/27/2019 07/10/2018 01/22/2018 12/24/2016 05/02/2016  Falls in the past year? 0 No No No No  Number falls in past yr: 0 - - - -  Injury with Fall? 0 - - - -    Depression Screen Depression screen [facility] 2/9 04/27/2019 07/10/2018 01/22/2018 12/24/2016 05/02/2016  Decreased Interest 0 0 0 0 0  Down, Depressed, Hopeless 0 0 0 0 0  PHQ - 2 Score 0 0 0 0 0  Altered sleeping 0 - - - -  Tired, decreased energy 0 - - - -  Change in appetite 0 - - - -  Feeling bad or failure about yourself  0 - - - -  Trouble concentrating 0 - - - -  Moving slowly or fidgety/restless 0 - - - -  Suicidal thoughts 0 - - - -  PHQ-9 Score 0 - - - -  Difficult doing work/chores Not difficult at all - - - -   Advanced Directives Does patient have a HCPOA?    yes Does patient have a living will or MOST form?  yes  Past Medical History:  Past Medical History:  Diagnosis Date  . 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 DDD (degenerative disc disease), lumbar   . Degenerative disc disease, lumbar    bulging and dengerated  . Diabetes mellitus without complication ([place])   . Hyperlipidemia   . Hypertension   . Osteoarthritis of both knees   . Osteopenia     Surgical History:  Past Surgical History:  Procedure Laterality Date  . CHOLECYSTECTOMY    . DILATION AND CURETTAGE  OF UTERUS    . TEAR DUCT PROBING WITH STRABISMUS REPAIR Right   . TONSILLECTOMY      Medications:  Current Outpatient Medications on File Prior to Visit  Medication Sig  . Azelaic Acid 15 % cream Apply topically 2 (two) times daily. Reported on 01/09/2016  . Elastic Bandages & Supports (MEDICAL COMPRESSION STOCKINGS) MISC by Does not apply route.  Marland Kitchen glucose blood (ONE TOUCH ULTRA TEST) test strip TEST ONCE DAILY AS DIRECTED  . latanoprost (XALATAN) 0.005 % ophthalmic solution 1 drop at bedtime.  . Multiple Vitamin (MULTIVITAMIN  WITH MINERALS) TABS tablet Take 1 tablet by mouth daily.   No current facility-administered medications on file prior to visit.     Allergies:  No Known Allergies  Social History:  Social History   Socioeconomic History  . Marital status: Married    Spouse name: Not on file  . Number of children: Not on file  . Years of education: Not on file  . Highest education level: Not on file  Occupational History  . Not on file  Social Needs  . Financial resource strain: Not hard at all  . Food insecurity    Worry: Never true    Inability: Never true  . Transportation needs    Medical: No    Non-medical: No  Tobacco Use  . Smoking status: Never Smoker  . Smokeless tobacco: Never Used  Substance and Sexual Activity  . Alcohol use: Yes    Alcohol/week: 0.0 standard drinks    Comment: on rare occasion  . Drug use: No  . Sexual activity: Not Currently  Lifestyle  . Physical activity    Days per week: 0 days    Minutes per session: 0 min  . Stress: Not at all  Relationships  . Social connections    Talks on phone: More than three times a week    Gets together: Once a week    Attends religious service: 1 to 4 times per year    Active member of club or organization: No    Attends meetings of clubs or organizations: Never    Relationship status: Married  . Intimate partner violence    Fear of current or ex partner: No    Emotionally abused: No    Physically abused: No    Forced sexual activity: No  Other Topics Concern  . Not on file  Social History Narrative  . Not on file   Social History   Tobacco Use  Smoking Status Never Smoker  Smokeless Tobacco Never Used   Social History   Substance and Sexual Activity  Alcohol Use Yes  . Alcohol/week: 0.0 standard drinks   Comment: on rare occasion    Family History:  Family History  Problem Relation Age of Onset  . Heart disease Mother        CHF  . Osteoporosis Mother   . Breast cancer Mother   . Heart disease  Father 84  . Cancer Brother        Colon CA- 2002- Youngest brother  . Parkinson's disease Brother        Younger Brother  . Heart disease Brother        2 brothers  . Breast cancer Maternal Grandmother     Past medical history, surgical history, medications, allergies, family history and social history reviewed with patient today and changes made to appropriate areas of the chart.   Review of Systems  Constitutional: Negative.   HENT: Negative.  Eyes: Negative.   Respiratory: Negative.   Cardiovascular: Positive for leg swelling. Negative for chest pain, palpitations, orthopnea, claudication and PND.  Gastrointestinal: Negative.   Genitourinary: Negative.   Musculoskeletal: Positive for back pain and joint pain. Negative for falls, myalgias and neck pain.  Skin: Negative.   Neurological: Negative.   Endo/Heme/Allergies: Negative.   Psychiatric/Behavioral: Negative.     All other ROS negative except what is listed above and in the HPI.      Objective:    BP (!) 123/100   Pulse 68   Temp 98 F (36.7 C) (Oral)   Wt 229 lb 9.6 oz (104.1 kg)   SpO2 (!) 68%   BMI 43.38 kg/m   Wt Readings from Last 3 Encounters:  04/27/19 229 lb 9.6 oz (104.1 kg)  07/10/18 234 lb 7 oz (106.3 kg)  01/22/18 237 lb 2 oz (107.6 kg)    Physical Exam Vitals signs and nursing note reviewed.  Constitutional:      General: She is not in acute distress.    Appearance: Normal appearance. She is not ill-appearing, toxic-appearing or diaphoretic.  HENT:     Head: Normocephalic and atraumatic.     Right Ear: Tympanic membrane, ear canal and external ear normal. There is no impacted cerumen.     Left Ear: Tympanic membrane, ear canal and external ear normal. There is no impacted cerumen.     Nose: Nose normal. No congestion or rhinorrhea.     Mouth/Throat:     Mouth: Mucous membranes are moist.     Pharynx: Oropharynx is clear. No oropharyngeal exudate or posterior oropharyngeal erythema.  Eyes:      General: No scleral icterus.       Right eye: No discharge.        Left eye: No discharge.     Extraocular Movements: Extraocular movements intact.     Conjunctiva/sclera: Conjunctivae normal.     Pupils: Pupils are equal, round, and reactive to light.  Neck:     Musculoskeletal: Normal range of motion and neck supple. No neck rigidity or muscular tenderness.     Vascular: No carotid bruit.  Cardiovascular:     Rate and Rhythm: Normal rate and regular rhythm.     Pulses: Normal pulses.     Heart sounds: No murmur. No friction rub. No gallop.   Pulmonary:     Effort: Pulmonary effort is normal. No respiratory distress.     Breath sounds: Normal breath sounds. No stridor. No wheezing, rhonchi or rales.  Chest:     Chest wall: No tenderness.  Abdominal:     General: Abdomen is flat. Bowel sounds are normal. There is no distension.     Palpations: Abdomen is soft. There is no mass.     Tenderness: There is no abdominal tenderness. There is no right CVA tenderness, left CVA tenderness, guarding or rebound.     Hernia: No hernia is present.  Genitourinary:    Comments: Breast and pelvic exams deferred with shared decision making Musculoskeletal:        General: Swelling present. No tenderness, deformity or signs of injury.     Right lower leg: No edema.     Left lower leg: No edema.  Lymphadenopathy:     Cervical: No cervical adenopathy.  Skin:    General: Skin is warm and dry.     Capillary Refill: Capillary refill takes less than 2 seconds.     Coloration: Skin is not jaundiced or pale.  Findings: No bruising, erythema, lesion or rash.  Neurological:     General: No focal deficit present.     Mental Status: She is alert and oriented to person, place, and time. Mental status is at baseline.     Cranial Nerves: No cranial nerve deficit.     Sensory: No sensory deficit.     Motor: No weakness.     Coordination: Coordination normal.     Gait: Gait normal.     Deep Tendon  Reflexes: Reflexes normal.  Psychiatric:        Mood and Affect: Mood normal.        Behavior: Behavior normal.        Thought Content: Thought content normal.        Judgment: Judgment normal.     6CIT Screen 04/27/2019 01/22/2018 12/24/2016  What Year? 0 points 0 points 0 points  What month? 0 points 0 points 0 points  What time? 0 points 0 points 0 points  Count back from 20 0 points 0 points 0 points  Months in reverse 0 points 0 points 2 points  Repeat phrase 0 points 0 points 0 points  Total Score 0 0 2     Results for orders placed or performed in visit on 07/20/18  HM DIABETES EYE EXAM  Result Value Ref Range   HM Diabetic Eye Exam No Retinopathy No Retinopathy      Assessment & Plan:   Problem List Items Addressed This Visit      Endocrine   Type 2 diabetes mellitus with renal complication (HCC)    Under good control on current regimen with A1c of 6.7. Continue current regimen. Continue to monitor. Call with any concerns. Refills given. Labs drawn today.       Relevant Medications   benazepril (LOTENSIN) 20 MG tablet   metFORMIN (GLUCOPHAGE-XR) 500 MG 24 hr tablet   Other Relevant Orders   Bayer DCA Hb A1c Waived   CBC with Differential/Platelet   Comprehensive metabolic panel   Microalbumin, Urine Waived   UA/M w/rflx Culture, Routine   CKD stage 2 due to type 2 diabetes mellitus (HCC)    Rechecking levels today. Await results. Call with any concerns.       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 Takes a pill 2-3x a week. 30 pills given with 1 refill, should last 6 months, will call for appointment if out before scheduled follow up.         Genitourinary   Benign hypertensive renal disease    Under good control on current regimen. Continue current regimen. Continue to monitor. Call with any concerns. Refills given. Labs drawn today.       Relevant Orders   CBC with Differential/Platelet   Comprehensive metabolic panel   Microalbumin, Urine Waived   UA/M w/rflx Culture, Routine     Other   Hyperlipidemia    Declines statin. Continue to monitor. Call with any concerns.       Relevant Medications   benazepril (LOTENSIN) 20 MG tablet   hydrochlorothiazide (HYDRODIURIL) 25 MG tablet   Other Relevant Orders   CBC with Differential/Platelet   Comprehensive metabolic  panel   Lipid Panel w/o Chol/HDL Ratio   UA/M w/rflx Culture, Routine   RESOLVED: Abnormal thyroid blood test    Rechecking levels today. Await results. Treat as needed.       Relevant Orders   CBC with Differential/Platelet   Comprehensive metabolic panel   TSH   UA/M w/rflx Culture, Routine    Other Visit Diagnoses    Encounter for Medicare annual wellness exam    -  Primary   Preventative care discussed today as below.    Routine general medical examination at a health care facility       Vaccines up to date. Screening labs checked today. Mammogram and DEXA UTD. Colonoscopy declined. Continue diet and exercise. Call with any concerns.     Need for hepatitis C screening test       Labs drawn today. Await results.    Relevant Orders   Hepatitis C Antibody       Preventative Services:  Health Risk Assessment and Personalized Prevention Plan: Done today Bone Mass Measurements: up to date Breast Cancer Screening: up to date CVD Screening:  Done today Cervical Cancer Screening: N/A Colon Cancer Screening: Declined Depression Screening: Done today Diabetes Screening: Done today Glaucoma Screening: See your eye doctor Hepatitis B vaccine: N/A Hepatitis C screening: Done today HIV Screening: N/A Flu Vaccine: In the Fall Lung cancer Screening:  N/A Obesity Screening: Done today Pneumonia Vaccines (2): Up to date STI Screening: N/A  Follow up plan: Return in about 6 months (around 10/28/2019) for follow up.   LABORATORY TESTING:  - Pap smear: not applicable  IMMUNIZATIONS:   - Tdap: Tetanus vaccination status reviewed: Declined. - Influenza: Postponed to flu season - Pneumovax: Up to date - Prevnar: Up to date  SCREENING: -Mammogram: Up to date  - Colonoscopy: Refused  - Bone Density: Up to date   PATIENT COUNSELING:   Advised to take 1 mg of folate supplement per day if capable of pregnancy.   Sexuality: Discussed sexually transmitted diseases, partner selection, use of condoms, avoidance of unintended pregnancy  and contraceptive alternatives.   Advised to avoid cigarette smoking.  I discussed with the patient that most people either abstain from alcohol or drink within safe limits (<=14/week and <=4 drinks/occasion for males, <=7/weeks and <= 3 drinks/occasion for females) and that the risk for alcohol disorders and other health effects rises proportionally with the number of drinks per week and how often a drinker exceeds daily limits.  Discussed cessation/primary prevention of drug use and availability of treatment for abuse.   Diet: Encouraged to adjust caloric intake to maintain  or achieve ideal body weight, to reduce intake of dietary saturated fat and total fat, to limit sodium intake by avoiding high sodium foods and not adding table salt, and to maintain adequate dietary potassium and calcium preferably from fresh fruits, vegetables, and low-fat dairy products.    stressed the importance of regular exercise  Injury prevention: Discussed safety belts, safety helmets, smoke detector, smoking near bedding or upholstery.   Dental health: Discussed importance of regular tooth brushing, flossing, and dental visits.    NEXT PREVENTATIVE PHYSICAL DUE IN 1 YEAR. Return in about 6 months (around 10/28/2019) for  follow up.

## 2019-04-27 NOTE — Assessment & Plan Note (Signed)
Declines statin. Continue to monitor. Call with any concerns.

## 2019-04-27 NOTE — Assessment & Plan Note (Signed)
Under good control on current regimen with A1c of 6.7. Continue current regimen. Continue to monitor. Call with any concerns. Refills given. Labs drawn today.

## 2019-04-27 NOTE — Assessment & Plan Note (Signed)
Rechecking levels today. Await results. Call with any concerns.  

## 2019-04-27 NOTE — Assessment & Plan Note (Signed)
Rechecking levels today. Await results. Treat as needed.  

## 2019-04-27 NOTE — Assessment & Plan Note (Signed)
Under good control on current regimen. Continue current regimen. Continue to monitor. Call with any concerns. Refills given. Labs drawn today.   

## 2019-04-28 LAB — CBC WITH DIFFERENTIAL/PLATELET
Basophils Absolute: 0.1 10*3/uL (ref 0.0–0.2)
Basos: 1 %
EOS (ABSOLUTE): 0.1 10*3/uL (ref 0.0–0.4)
Eos: 3 %
Hematocrit: 45.6 % (ref 34.0–46.6)
Hemoglobin: 15 g/dL (ref 11.1–15.9)
Immature Grans (Abs): 0 10*3/uL (ref 0.0–0.1)
Immature Granulocytes: 0 %
Lymphocytes Absolute: 1.1 10*3/uL (ref 0.7–3.1)
Lymphs: 19 %
MCH: 27.8 pg (ref 26.6–33.0)
MCHC: 32.9 g/dL (ref 31.5–35.7)
MCV: 85 fL (ref 79–97)
Monocytes Absolute: 0.4 10*3/uL (ref 0.1–0.9)
Monocytes: 7 %
Neutrophils Absolute: 3.9 10*3/uL (ref 1.4–7.0)
Neutrophils: 70 %
Platelets: 309 10*3/uL (ref 150–450)
RBC: 5.39 x10E6/uL — ABNORMAL HIGH (ref 3.77–5.28)
RDW: 14.7 % (ref 11.7–15.4)
WBC: 5.6 10*3/uL (ref 3.4–10.8)

## 2019-04-28 LAB — TSH: TSH: 2.97 u[IU]/mL (ref 0.450–4.500)

## 2019-04-28 LAB — COMPREHENSIVE METABOLIC PANEL
ALT: 19 IU/L (ref 0–32)
AST: 18 IU/L (ref 0–40)
Albumin/Globulin Ratio: 1.9 (ref 1.2–2.2)
Albumin: 4.4 g/dL (ref 3.7–4.7)
Alkaline Phosphatase: 61 IU/L (ref 39–117)
BUN/Creatinine Ratio: 15 (ref 12–28)
BUN: 20 mg/dL (ref 8–27)
Bilirubin Total: 0.6 mg/dL (ref 0.0–1.2)
CO2: 21 mmol/L (ref 20–29)
Calcium: 9.5 mg/dL (ref 8.7–10.3)
Chloride: 101 mmol/L (ref 96–106)
Creatinine, Ser: 1.31 mg/dL — ABNORMAL HIGH (ref 0.57–1.00)
GFR calc Af Amer: 47 mL/min/{1.73_m2} — ABNORMAL LOW (ref >59)
GFR calc non Af Amer: 40 mL/min/{1.73_m2} — ABNORMAL LOW (ref >59)
Globulin, Total: 2.3 g/dL (ref 1.5–4.5)
Glucose: 129 mg/dL — ABNORMAL HIGH (ref 65–99)
Potassium: 4.4 mmol/L (ref 3.5–5.2)
Sodium: 140 mmol/L (ref 134–144)
Total Protein: 6.7 g/dL (ref 6.0–8.5)

## 2019-04-28 LAB — LIPID PANEL W/O CHOL/HDL RATIO
Cholesterol, Total: 194 mg/dL (ref 100–199)
HDL: 47 mg/dL (ref >39)
LDL Calculated: 119 mg/dL — ABNORMAL HIGH (ref 0–99)
Triglycerides: 140 mg/dL (ref 0–149)
VLDL Cholesterol Cal: 28 mg/dL (ref 5–40)

## 2019-04-28 LAB — HEPATITIS C ANTIBODY: Hep C Virus Ab: <0.1 s/co ratio (ref 0.0–0.9)

## 2019-04-29 LAB — UA/M W/RFLX CULTURE, ROUTINE
Bilirubin, UA: NEGATIVE
Glucose, UA: NEGATIVE
Ketones, UA: NEGATIVE
Nitrite, UA: NEGATIVE
Protein,UA: NEGATIVE
RBC, UA: NEGATIVE
Specific Gravity, UA: 1.025 (ref 1.005–1.030)
Urobilinogen, Ur: 0.2 mg/dL (ref 0.2–1.0)
pH, UA: 5 (ref 5.0–7.5)

## 2019-04-29 LAB — BAYER DCA HB A1C WAIVED: HB A1C (BAYER DCA - WAIVED): 6.7 % (ref <7.0)

## 2019-04-29 LAB — MICROSCOPIC EXAMINATION: RBC, Urine: NONE SEEN /hpf (ref 0–2)

## 2019-04-29 LAB — MICROALBUMIN, URINE WAIVED
Creatinine, Urine Waived: 100 mg/dL (ref 10–300)
Microalb, Ur Waived: 30 mg/L — ABNORMAL HIGH (ref 0–19)
Microalb/Creat Ratio: <30 mg/g (ref <30)

## 2019-04-29 LAB — URINE CULTURE, REFLEX

## 2019-04-30 ENCOUNTER — Encounter: Payer: Self-pay | Admitting: Family Medicine

## 2019-05-05 LAB — HM DIABETES EYE EXAM

## 2019-06-14 ENCOUNTER — Other Ambulatory Visit: Payer: Self-pay | Admitting: Family Medicine

## 2019-07-29 ENCOUNTER — Ambulatory Visit: Payer: Medicare Other | Admitting: Family Medicine

## 2019-07-30 ENCOUNTER — Encounter: Payer: Self-pay | Admitting: Family Medicine

## 2019-07-30 ENCOUNTER — Ambulatory Visit
Admission: RE | Admit: 2019-07-30 | Discharge: 2019-07-30 | Disposition: A | Discharge status: Home / Self Care | Payer: Medicare Other | Attending: Family Medicine | Admitting: Family Medicine

## 2019-07-30 ENCOUNTER — Ambulatory Visit
Admission: RE | Admit: 2019-07-30 | Discharge: 2019-07-30 | Disposition: A | Discharge status: Home / Self Care | Payer: Medicare Other | Source: Ambulatory Visit | Attending: Family Medicine | Admitting: Family Medicine

## 2019-07-30 ENCOUNTER — Other Ambulatory Visit: Payer: Self-pay

## 2019-07-30 ENCOUNTER — Ambulatory Visit: Payer: Medicare Other | Admitting: Family Medicine

## 2019-07-30 VITALS — BP 119/89 | HR 108 | Temp 97.9°F | Ht 61.0 in | Wt 233.0 lb

## 2019-07-30 DIAGNOSIS — H6981 Other specified disorders of Eustachian tube, right ear: Secondary | ICD-10-CM | POA: Diagnosis not present

## 2019-07-30 DIAGNOSIS — M25531 Pain in right wrist: Secondary | ICD-10-CM | POA: Diagnosis not present

## 2019-07-30 MED ORDER — PREDNISONE 50 MG PO TABS: 50.0000 mg | ORAL_TABLET | Freq: Every day | ORAL | 0 refills | Status: DC

## 2019-07-30 NOTE — Progress Notes (Signed)
BP 119/89   Pulse (!) 108   Temp 97.9 F (36.6 C) (Oral)   Ht 5\' 1"  (1.549 m)   Wt 233 lb (105.7 kg)   SpO2 95%   BMI 44.02 kg/m    Subjective:    Patient ID: Karen Farley, female    DOB: 1946-01-06, 73 y.o.   MRN: [id number]  HPI: Karen Farley is a 73 y.o. female  Chief Complaint  Patient presents with  . Ear Problem    right ear stopped up x over a month   . Fall    pt states she fell on 06/23/19 and while trying to catch herself up she put all her weight on her right wrist   EAG CLOGGED Duration: a bit over a month Involved ear(s):  "right Sensation of feeling clogged/plugged: yes Decreased/muffled hearing:yes Ear pain: no Fever: no Otorrhea: no Hearing loss: yes Upper respiratory infection symptoms: no Using Q-Tips: no Status: stable History of cerumenosis: no Treatments attempted: peroxide  ARM PAIN- fell in the garage trying to chase a frog out Duration: 5 weeks Location: R wrist Mechanism of injury: trauma Onset: sudden Severity: severe  Quality:  sharp Frequency: constant Radiation: into her arm Aggravating factors: moving her wrist   Alleviating factors: ice Status: better Treatments attempted: narcotics, rest, ice, APAP, ibuprofen and aleve  Relief with NSAIDs?:  mild Swelling: yes Redness: no  Warmth: no Trauma: yes Chest pain: no  Shortness of breath: no  Fever: no Decreased sensation: no Paresthesias: no Weakness: no  Relevant past medical, surgical, family and social history reviewed and updated as indicated. Interim medical history since our last visit reviewed. Allergies and medications reviewed and updated.  Review of Systems  Constitutional: Negative.   Respiratory: Negative.   Cardiovascular: Negative.   Gastrointestinal: Negative.   Musculoskeletal: Positive for arthralgias and myalgias. Negative for back pain, gait problem, joint swelling, neck pain and neck stiffness.  Skin: Negative.   Neurological: Negative.    Psychiatric/Behavioral: Negative.     Per HPI unless specifically indicated above     Objective:    BP 119/89   Pulse (!) 108   Temp 97.9 F (36.6 C) (Oral)   Ht 5\' 1"  (1.549 m)   Wt 233 lb (105.7 kg)   SpO2 95%   BMI 44.02 kg/m   Wt Readings from Last 3 Encounters:  07/30/19 233 lb (105.7 kg)  04/27/19 229 lb 9.6 oz (104.1 kg)  07/10/18 234 lb 7 oz (106.3 kg)    Physical Exam Vitals signs and nursing note reviewed.  Constitutional:      General: She is not in acute distress.    Appearance: Normal appearance. She is obese. She is not ill-appearing, toxic-appearing or diaphoretic.  HENT:     Head: Normocephalic and atraumatic.     Right Ear: Tympanic membrane, ear canal and external ear normal. There is no impacted cerumen.     Left Ear: Tympanic membrane, ear canal and external ear normal. There is no impacted cerumen.     Nose: Nose normal.     Mouth/Throat:     Mouth: Mucous membranes are moist.     Pharynx: Oropharynx is clear.  Eyes:     General: No scleral icterus.       Right eye: No discharge.        Left eye: No discharge.     Extraocular Movements: Extraocular movements intact.     Conjunctiva/sclera: Conjunctivae normal.     Pupils: Pupils  are equal, round, and reactive to light.  Neck:     Musculoskeletal: Normal range of motion and neck supple.  Cardiovascular:     Rate and Rhythm: Normal rate and regular rhythm.     Pulses: Normal pulses.     Heart sounds: Normal heart sounds. No murmur. No friction rub. No gallop.   Pulmonary:     Effort: Pulmonary effort is normal. No respiratory distress.     Breath sounds: Normal breath sounds. No stridor. No wheezing, rhonchi or rales.  Chest:     Chest wall: No tenderness.  Musculoskeletal: Normal range of motion.        General: Tenderness (along carpal bones) present.  Skin:    General: Skin is warm and dry.     Capillary Refill: Capillary refill takes less than 2 seconds.     Coloration: Skin is not  jaundiced or pale.     Findings: No bruising, erythema, lesion or rash.  Neurological:     General: No focal deficit present.     Mental Status: She is alert and oriented to person, place, and time. Mental status is at baseline.  Psychiatric:        Mood and Affect: Mood normal.        Behavior: Behavior normal.        Thought Content: Thought content normal.        Judgment: Judgment normal.     Results for orders placed or performed in visit on 05/06/19  HM DIABETES EYE EXAM  Result Value Ref Range   HM Diabetic Eye Exam No Retinopathy No Retinopathy      Assessment & Plan:   Problem List Items Addressed This Visit    None    Visit Diagnoses    Right wrist pain    -  Primary   Fall 1 month ago continuing with pain- will obtain x-ray and start naproxen after prednisone. Await results.   Relevant Orders   DG Wrist Complete Right   ETD (Eustachian tube dysfunction), right       Will treat with burst of prednisone. Call if not getting better or getting worse.        Follow up plan: Return if symptoms worsen or fail to improve.

## 2019-08-02 ENCOUNTER — Telehealth: Payer: Self-pay | Admitting: Family Medicine

## 2019-08-02 DIAGNOSIS — S52514A Nondisplaced fracture of right radial styloid process, initial encounter for closed fracture: Secondary | ICD-10-CM

## 2019-08-02 DIAGNOSIS — H6980 Other specified disorders of Eustachian tube, unspecified ear: Secondary | ICD-10-CM

## 2019-08-02 NOTE — Telephone Encounter (Signed)
Please let her know that she did break her wrist- so I'm going to get her into the hand specialist. Thanks! (urgent referral placed)

## 2019-08-02 NOTE — Telephone Encounter (Signed)
If she's not better with finishing the prednisone I'll get her into see the ENT. Let me know.

## 2019-08-02 NOTE — Telephone Encounter (Signed)
Patient notified

## 2019-08-02 NOTE — Telephone Encounter (Signed)
Karen Farley notified. Karen Farley wants to let Dr. Wynetta Emery know that her ear has not cleared up yet and that she is still taking the Prednisone.

## 2019-08-05 NOTE — Addendum Note (Signed)
Addended by: Valerie Roys on: 08/05/2019 04:16 PM   Modules accepted: Orders

## 2019-08-05 NOTE — Telephone Encounter (Signed)
Pt called in and stated that the prednisone that she was put on for her ear is not helping at all.  She stated there has been no change  Best number  [phone]

## 2019-08-06 DIAGNOSIS — S52513A Displaced fracture of unspecified radial styloid process, initial encounter for closed fracture: Secondary | ICD-10-CM | POA: Insufficient documentation

## 2019-08-09 ENCOUNTER — Telehealth: Payer: Self-pay | Admitting: Family Medicine

## 2019-08-09 NOTE — Telephone Encounter (Signed)
Copied from [place] [phone]. Topic: Referral - Request for Referral >> Aug 09, 2019  8:19 AM Rayann Heman wrote: Has patient seen PCP for this complaint? Yes  Referral for which specialty: ENT  Preferred provider/office: any  Reason for referral: hard to hear out of right ear

## 2019-08-09 NOTE — Telephone Encounter (Signed)
Referral already placed.

## 2019-08-11 ENCOUNTER — Other Ambulatory Visit: Payer: Self-pay | Admitting: Physician Assistant

## 2019-08-11 DIAGNOSIS — E041 Nontoxic single thyroid nodule: Secondary | ICD-10-CM

## 2019-08-16 ENCOUNTER — Other Ambulatory Visit: Payer: Self-pay

## 2019-08-16 ENCOUNTER — Ambulatory Visit
Admission: RE | Admit: 2019-08-16 | Discharge: 2019-08-16 | Disposition: A | Discharge status: Home / Self Care | Payer: Medicare Other | Source: Ambulatory Visit | Attending: Physician Assistant | Admitting: Physician Assistant

## 2019-08-16 DIAGNOSIS — E041 Nontoxic single thyroid nodule: Secondary | ICD-10-CM | POA: Insufficient documentation

## 2019-08-20 ENCOUNTER — Other Ambulatory Visit: Payer: Self-pay | Admitting: Family Medicine

## 2019-08-20 DIAGNOSIS — Z1231 Encounter for screening mammogram for malignant neoplasm of breast: Secondary | ICD-10-CM

## 2019-08-26 ENCOUNTER — Encounter: Payer: Self-pay | Admitting: Family Medicine

## 2019-08-26 LAB — HM DEXA SCAN

## 2019-11-01 DIAGNOSIS — H40053 Ocular hypertension, bilateral: Secondary | ICD-10-CM | POA: Diagnosis not present

## 2019-11-02 ENCOUNTER — Ambulatory Visit: Payer: Medicare Other | Admitting: Family Medicine

## 2019-11-06 ENCOUNTER — Other Ambulatory Visit: Payer: Self-pay | Admitting: Family Medicine

## 2019-11-07 NOTE — Telephone Encounter (Signed)
Requested Prescriptions  Pending Prescriptions Disp Refills  . hydrochlorothiazide (HYDRODIURIL) 25 MG tablet [Pharmacy Med Name: HYDROCHLOROTHIAZIDE 25MG TABLETS] 90 tablet 0    Sig: TAKE 1 TABLET(25 MG) BY MOUTH DAILY     Cardiovascular: Diuretics - Thiazide Failed - 11/06/2019  7:09 AM      Failed - Cr in normal range and within 360 days    Creatinine, Ser  Date Value Ref Range Status  04/27/2019 1.31 (H) 0.57 - 1.00 mg/dL Final         Passed - Ca in normal range and within 360 days    Calcium  Date Value Ref Range Status  04/27/2019 9.5 8.7 - 10.3 mg/dL Final         Passed - K in normal range and within 360 days    Potassium  Date Value Ref Range Status  04/27/2019 4.4 3.5 - 5.2 mmol/L Final         Passed - Na in normal range and within 360 days    Sodium  Date Value Ref Range Status  04/27/2019 140 134 - 144 mmol/L Final         Passed - Last BP in normal range    BP Readings from Last 1 Encounters:  07/30/19 119/89         Passed - Valid encounter within last 6 months    Recent Outpatient Visits          3 months ago Right wrist pain   [place], [name], DO   6 months ago Encounter for [employer] annual wellness exam   [employer], [name], DO   1 year ago Type 2 diabetes mellitus with stage 2 chronic kidney disease, without long-term current use of insulin ([place])   [place], [name], DO   1 year ago Routine general medical examination at a health care facility   [facility], [place] P, DO   2 years ago Type 2 diabetes mellitus with stage 2 chronic kidney disease, without long-term current use of insulin ([place])   [place], [name], DO      Future Appointments            In 1 week Johnson, [name], DO [facility], [facility]           . benazepril (LOTENSIN) 20 MG tablet [Pharmacy Med Name: BENAZEPRIL 20MG TABLETS] 90 tablet 0    Sig:  TAKE 1 TABLET(20 MG) BY MOUTH DAILY     Cardiovascular:  ACE Inhibitors Failed - 11/06/2019  7:09 AM      Failed - Cr in normal range and within 180 days    Creatinine, Ser  Date Value Ref Range Status  04/27/2019 1.31 (H) 0.57 - 1.00 mg/dL Final         Failed - K in normal range and within 180 days    Potassium  Date Value Ref Range Status  04/27/2019 4.4 3.5 - 5.2 mmol/L Final         Passed - Patient is not pregnant      Passed - Last BP in normal range    BP Readings from Last 1 Encounters:  07/30/19 119/89         Passed - Valid encounter within last 6 months    Recent Outpatient Visits          3 months ago Right wrist pain   [place]  [place], [name], DO   6 months ago Encounter for [employer] annual wellness exam   [place], [name], DO   1 year ago Type 2 diabetes mellitus with stage 2 chronic kidney disease, without long-term current use of insulin ([place])   [place], [name], DO   1 year ago Routine general medical examination at a health care facility   [facility], [place] P, DO   2 years ago Type 2 diabetes mellitus with stage 2 chronic kidney disease, without long-term current use of insulin ([place])   [place], [place], DO      Future Appointments            In 1 week Johnson, [name], DO [place], [facility]           . metFORMIN (GLUCOPHAGE-XR) 500 MG 24 hr tablet [Pharmacy Med Name: METFORMIN ER 500MG 24HR TABS] 360 tablet 0    Sig: TAKE 2 TABLETS(1000 MG) BY MOUTH TWICE DAILY     Endocrinology:  Diabetes - Biguanides Failed - 11/06/2019  7:09 AM      Failed - Cr in normal range and within 360 days    Creatinine, Ser  Date Value Ref Range Status  04/27/2019 1.31 (H) 0.57 - 1.00 mg/dL Final         Failed - HBA1C is between 0 and 7.9 and within 180 days    Hemoglobin A1C  Date Value Ref Range Status  06/28/2016 6.6  Final   HB A1C  (BAYER DCA - WAIVED)  Date Value Ref Range Status  04/27/2019 6.7 <7.0 % Final    Comment:                                          Diabetic Adult            <7.0                                       Healthy Adult        4.3 - 5.7                                                           (DCCT/NGSP) American Diabetes Association's Summary of Glycemic Recommendations for Adults with Diabetes: Hemoglobin A1c <7.0%. More stringent glycemic goals (A1c <6.0%) may further reduce complications at the cost of increased risk of hypoglycemia.          Failed - eGFR in normal range and within 360 days    GFR calc Af Amer  Date Value Ref Range Status  04/27/2019 47 (L) >59 mL/min/1.73 Final   GFR calc non Af Amer  Date Value Ref Range Status  04/27/2019 40 (L) >59 mL/min/1.73 Final         Passed - Valid encounter within last 6 months    Recent Outpatient Visits          3 months ago Right wrist pain   [place], DO   6 months ago Encounter for [employer] annual  wellness exam   [facility] [place], [place] P, DO   1 year ago Type 2 diabetes mellitus with stage 2 chronic kidney disease, without long-term current use of insulin ([place])   [place], [name], DO   1 year ago Routine general medical examination at a health care facility   [facility], [place] P, DO   2 years ago Type 2 diabetes mellitus with stage 2 chronic kidney disease, without long-term current use of insulin ([place])   [place], [place], DO      Future Appointments            In 1 week Wynetta Emery, Barb Merino, DO [employer], [facility]

## 2019-11-15 ENCOUNTER — Encounter: Payer: Self-pay | Admitting: Family Medicine

## 2019-11-15 ENCOUNTER — Ambulatory Visit (INDEPENDENT_AMBULATORY_CARE_PROVIDER_SITE_OTHER): Payer: Medicare PPO | Admitting: Family Medicine

## 2019-11-15 VITALS — Ht 61.5 in | Wt 230.0 lb

## 2019-11-15 DIAGNOSIS — I129 Hypertensive chronic kidney disease with stage 1 through stage 4 chronic kidney disease, or unspecified chronic kidney disease: Secondary | ICD-10-CM

## 2019-11-15 DIAGNOSIS — E782 Mixed hyperlipidemia: Secondary | ICD-10-CM

## 2019-11-15 DIAGNOSIS — Z6841 Body Mass Index (BMI) 40.0 and over, adult: Secondary | ICD-10-CM

## 2019-11-15 DIAGNOSIS — N182 Chronic kidney disease, stage 2 (mild): Secondary | ICD-10-CM

## 2019-11-15 DIAGNOSIS — E1122 Type 2 diabetes mellitus with diabetic chronic kidney disease: Secondary | ICD-10-CM

## 2019-11-15 MED ORDER — HYDROCHLOROTHIAZIDE 25 MG PO TABS: 25.0000 mg | ORAL_TABLET | Freq: Every day | ORAL | 1 refills | Status: DC

## 2019-11-15 MED ORDER — METFORMIN HCL ER 500 MG PO TB24: 1000.0000 mg | ORAL_TABLET | Freq: Two times a day (BID) | ORAL | 1 refills | Status: DC

## 2019-11-15 MED ORDER — BENAZEPRIL HCL 20 MG PO TABS: 20.0000 mg | ORAL_TABLET | Freq: Every day | ORAL | 1 refills | Status: DC

## 2019-11-15 NOTE — Progress Notes (Signed)
Ht 5' 1.5" (1.562 m)   Wt 230 lb (104.3 kg)   BMI 42.75 kg/m    Subjective:    Patient ID: Karen Farley, female    DOB: 1945-10-25, 74 y.o.   MRN: [id number]  HPI: Karen Farley is a 74 y.o. female  Chief Complaint  Patient presents with  . Diabetes  . Hypertension  . Hyperlipidemia  . Medication Refill   HYPERTENSION / HYPERLIPIDEMIA Satisfied with current treatment? yes Duration of hypertension: chronic BP monitoring frequency: not checking BP medication side effects: no Past BP meds: benazepril, HCTZ Duration of hyperlipidemia: chronic Cholesterol medication side effects: not on anything Cholesterol supplements: none Past cholesterol medications: none Medication compliance: excellent compliance Aspirin: no Recent stressors: yes Recurrent headaches: no Visual changes: no Palpitations: no Dyspnea: no Chest pain: no Lower extremity edema: no Dizzy/lightheaded: no  DIABETES Hypoglycemic episodes:no Polydipsia/polyuria: no Visual disturbance: no Chest pain: no Paresthesias: no Glucose Monitoring: yes  Accucheck frequency: Daily  Fasting glucose: 116-134 Taking Insulin?: no Blood Pressure Monitoring: not checking Retinal Examination: Up to Date Foot Exam: Up to Date Diabetic Education: Completed Pneumovax: Up to Date Influenza: Up to Date Aspirin: no  Relevant past medical, surgical, family and social history reviewed and updated as indicated. Interim medical history since our last visit reviewed. Allergies and medications reviewed and updated.  Review of Systems  Constitutional: Negative.   HENT: Negative.   Respiratory: Negative.   Gastrointestinal: Negative.   Musculoskeletal: Positive for arthralgias (elbow was hurting- feeling much better now). Negative for back pain, gait problem, joint swelling, myalgias, neck pain and neck stiffness.  Skin: Negative.   Psychiatric/Behavioral: Negative.     Per HPI unless specifically indicated  above     Objective:    Ht 5' 1.5" (1.562 m)   Wt 230 lb (104.3 kg)   BMI 42.75 kg/m   Wt Readings from Last 3 Encounters:  11/15/19 230 lb (104.3 kg)  07/30/19 233 lb (105.7 kg)  04/27/19 229 lb 9.6 oz (104.1 kg)    Physical Exam Vitals and nursing note reviewed.  Pulmonary:     Effort: Pulmonary effort is normal. No respiratory distress.     Comments: Speaking in full sentences Neurological:     Mental Status: She is alert.  Psychiatric:        Mood and Affect: Mood normal.        Behavior: Behavior normal.        Thought Content: Thought content normal.        Judgment: Judgment normal.     Results for orders placed or performed in visit on 05/06/19  HM DIABETES EYE EXAM  Result Value Ref Range   HM Diabetic Eye Exam No Retinopathy No Retinopathy      Assessment & Plan:   Problem List Items Addressed This Visit      Endocrine   Type 2 diabetes mellitus with renal complication ([place]) - Primary    Feeling well. Will recheck labs tomorrow. Adjust medication as needed. Call with any concerns.       Relevant Medications   benazepril (LOTENSIN) 20 MG tablet   metFORMIN (GLUCOPHAGE-XR) 500 MG 24 hr tablet   Other Relevant Orders   Comprehensive metabolic panel   Bayer DCA Hb A1c Waived   CKD stage 2 due to type 2 diabetes mellitus ([place])    Will recheck labs tomorrow. Treat as needed. Call with any concerns.       Relevant Medications   benazepril (  LOTENSIN) 20 MG tablet   metFORMIN (GLUCOPHAGE-XR) 500 MG 24 hr tablet   Other Relevant Orders   Comprehensive metabolic panel   CBC with Differential/Platelet     Genitourinary   Benign hypertensive renal disease    Feeling well. Will recheck BP and labs tomorrow. Adjust medication as needed. Call with any concerns.       Relevant Orders   Comprehensive metabolic panel     Other   Hyperlipidemia    Rechecking labs tomorrow. Await results. Treat as needed.       Relevant Medications   benazepril  (LOTENSIN) 20 MG tablet   hydrochlorothiazide (HYDRODIURIL) 25 MG tablet   Other Relevant Orders   Comprehensive metabolic panel   Lipid Panel w/o Chol/HDL Ratio    Other Visit Diagnoses    Morbid obesity ([place])       Will work on diet and exercise with goal of losing 1-2 lbs per week.    Relevant Medications   metFORMIN (GLUCOPHAGE-XR) 500 MG 24 hr tablet   BMI 40.0-44.9, adult ([place])       Will work on diet and exercise with goal of losing 1-2 lbs per week.    Relevant Medications   metFORMIN (GLUCOPHAGE-XR) 500 MG 24 hr tablet       Follow up plan: Return in about 6 months (around 05/14/2020).    . This visit was completed via telephone due to the restrictions of the COVID-19 pandemic. All issues as above were discussed and addressed but no physical exam was performed. If it was felt that the patient should be evaluated in the office, they were directed there. The patient verbally consented to this visit. Patient was unable to complete an audio/visual visit due to Lack of equipment. Due to the catastrophic nature of the COVID-19 pandemic, this visit was done through audio contact only. . Location of the patient: home . Location of the provider: work . Those involved with this call:  . Provider: Park Liter, DO . CMA: Lesle Chris, [place] . Front Desk/Registration: Don Perking  . Time spent on call: 25 minutes on the phone discussing health concerns. 40 minutes total spent in review of patient's record and preparation of their chart.

## 2019-11-15 NOTE — Assessment & Plan Note (Signed)
Feeling well. Will recheck labs tomorrow. Adjust medication as needed. Call with any concerns.

## 2019-11-15 NOTE — Assessment & Plan Note (Signed)
Rechecking labs tomorrow. Await results. Treat as needed.

## 2019-11-15 NOTE — Assessment & Plan Note (Signed)
Will recheck labs tomorrow. Treat as needed. Call with any concerns.

## 2019-11-15 NOTE — Assessment & Plan Note (Signed)
Feeling well. Will recheck BP and labs tomorrow. Adjust medication as needed. Call with any concerns.

## 2019-11-15 NOTE — Patient Instructions (Signed)
We are recommending the vaccine to everyone who has not had an allergic reaction to any of the components of the vaccine. If you have specific questions about the vaccine, please bring them up with your health care provider to discuss them.   We will likely not be getting the vaccine in the office for the first rounds of vaccinations. The way they are releasing the vaccines is going to be through the health systems (like [facility], [facility], [facility], [facility]) or through your county health department.   The [facility] is giving vaccines to those 75+ starting 10/27/19  M-F 7AM to 4PM [facility] [employer] [facility] [address], [place], [place] [employer] in a drive through tent  If you are 65+ you can get a vaccine through [facility] by signing up for an appointment.  You can sign up by going to: [email or web address].  You can get more information by going to: [email or web address]://[email or web address].ncdhhs.gov/vaccines 

## 2019-11-16 ENCOUNTER — Encounter: Payer: Self-pay | Admitting: Family Medicine

## 2019-11-16 ENCOUNTER — Ambulatory Visit (INDEPENDENT_AMBULATORY_CARE_PROVIDER_SITE_OTHER): Payer: Medicare PPO | Admitting: Family Medicine

## 2019-11-16 ENCOUNTER — Other Ambulatory Visit: Payer: Medicare PPO

## 2019-11-16 ENCOUNTER — Other Ambulatory Visit: Payer: Self-pay

## 2019-11-16 ENCOUNTER — Ambulatory Visit: Payer: Medicare PPO

## 2019-11-16 VITALS — BP 139/85 | HR 104 | Temp 98.6°F | Wt 232.0 lb

## 2019-11-16 DIAGNOSIS — E1122 Type 2 diabetes mellitus with diabetic chronic kidney disease: Secondary | ICD-10-CM

## 2019-11-16 DIAGNOSIS — I4891 Unspecified atrial fibrillation: Secondary | ICD-10-CM

## 2019-11-16 DIAGNOSIS — I129 Hypertensive chronic kidney disease with stage 1 through stage 4 chronic kidney disease, or unspecified chronic kidney disease: Secondary | ICD-10-CM

## 2019-11-16 DIAGNOSIS — E782 Mixed hyperlipidemia: Secondary | ICD-10-CM | POA: Diagnosis not present

## 2019-11-16 DIAGNOSIS — N182 Chronic kidney disease, stage 2 (mild): Secondary | ICD-10-CM

## 2019-11-16 DIAGNOSIS — R000 Tachycardia, unspecified: Secondary | ICD-10-CM | POA: Diagnosis not present

## 2019-11-16 LAB — BAYER DCA HB A1C WAIVED: HB A1C (BAYER DCA - WAIVED): 6.5 % (ref <7.0)

## 2019-11-16 MED ORDER — METOPROLOL SUCCINATE ER 25 MG PO TB24: 25.0000 mg | ORAL_TABLET | Freq: Every day | ORAL | 3 refills | Status: DC

## 2019-11-16 MED ORDER — RIVAROXABAN 20 MG PO TABS: 20.0000 mg | ORAL_TABLET | Freq: Every day | ORAL | 2 refills | Status: DC

## 2019-11-16 NOTE — Progress Notes (Signed)
BP 139/85 (BP Location: Left Arm, Patient Position: Sitting, Cuff Size: Large)   Pulse (!) 104 Comment: HR fluctuating do the 130's  Temp 98.6 F (37 C) (Oral)   Wt 232 lb (105.2 kg)   SpO2 94%   BMI 43.13 kg/m    Subjective:    Patient ID: Karen Farley, female    DOB: 05/25/1946, 74 y.o.   MRN: [id number]  HPI: Karen Farley is a 74 y.o. female  Chief Complaint  Patient presents with  . Abnormal Heart Rate   Tarshia came in today for a BP check and blood work following her appointment yesterday. Her BP was normal, but HR jumping from 99-140.   ATRIAL FIBRILLATION Atrial fibrillation status: newly diagnosed Satisfied with current treatment: N/A  Etiology of atrial fibrillation: unknown Palpitations:  no Chest pain:  no Dyspnea on exertion:  no Orthopnea:  no Syncope:  no Edema:  no Ventricular rate control: none Anti-coagulation: none   Relevant past medical, surgical, family and social history reviewed and updated as indicated. Interim medical history since our last visit reviewed. Allergies and medications reviewed and updated.  Review of Systems  Constitutional: Negative.   Respiratory: Negative.   Cardiovascular: Negative.   Musculoskeletal: Negative.   Neurological: Negative.   Psychiatric/Behavioral: Negative.     Per HPI unless specifically indicated above     Objective:    BP 139/85 (BP Location: Left Arm, Patient Position: Sitting, Cuff Size: Large)   Pulse (!) 104 Comment: HR fluctuating do the 130's  Temp 98.6 F (37 C) (Oral)   Wt 232 lb (105.2 kg)   SpO2 94%   BMI 43.13 kg/m   Wt Readings from Last 3 Encounters:  11/16/19 232 lb (105.2 kg)  11/15/19 230 lb (104.3 kg)  07/30/19 233 lb (105.7 kg)    Physical Exam Vitals and nursing note reviewed.  Constitutional:      General: She is not in acute distress.    Appearance: Normal appearance. She is not ill-appearing, toxic-appearing or diaphoretic.  HENT:     Head:  Normocephalic and atraumatic.     Right Ear: External ear normal.     Left Ear: External ear normal.     Nose: Nose normal.     Mouth/Throat:     Mouth: Mucous membranes are moist.     Pharynx: Oropharynx is clear.  Eyes:     General: No scleral icterus.       Right eye: No discharge.        Left eye: No discharge.     Extraocular Movements: Extraocular movements intact.     Conjunctiva/sclera: Conjunctivae normal.     Pupils: Pupils are equal, round, and reactive to light.  Cardiovascular:     Rate and Rhythm: Tachycardia present. Rhythm irregular.     Pulses: Normal pulses.     Heart sounds: No murmur. No friction rub. No gallop.   Pulmonary:     Effort: Pulmonary effort is normal. No respiratory distress.     Breath sounds: Normal breath sounds. No stridor. No wheezing, rhonchi or rales.  Chest:     Chest wall: No tenderness.  Musculoskeletal:        General: Normal range of motion.     Cervical back: Normal range of motion and neck supple.  Skin:    General: Skin is warm and dry.     Capillary Refill: Capillary refill takes less than 2 seconds.     Coloration: Skin is not  jaundiced or pale.     Findings: No bruising, erythema, lesion or rash.  Neurological:     General: No focal deficit present.     Mental Status: She is alert and oriented to person, place, and time. Mental status is at baseline.  Psychiatric:        Mood and Affect: Mood normal.        Behavior: Behavior normal.        Thought Content: Thought content normal.        Judgment: Judgment normal.     Results for orders placed or performed in visit on 05/06/19  HM DIABETES EYE EXAM  Result Value Ref Range   HM Diabetic Eye Exam No Retinopathy No Retinopathy      Assessment & Plan:   Problem List Items Addressed This Visit      Cardiovascular and Mediastinum   New onset atrial fibrillation ([place]) - Primary    Will start her on metoprolol and xarelto. Will get her into see cardiology. Call with any  concerns. Continue to monitor. Recheck 1 month.       Relevant Medications   metoprolol succinate (TOPROL-XL) 25 MG 24 hr tablet   rivaroxaban (XARELTO) 20 MG TABS tablet   Other Relevant Orders   Ambulatory referral to Cardiology    Other Visit Diagnoses    Tachycardia       EKG shows a fib.    Relevant Orders   EKG 12-Lead (Completed)       Follow up plan: Return in about 4 weeks (around 12/14/2019).

## 2019-11-16 NOTE — Patient Instructions (Addendum)
We are recommending the vaccine to everyone who has not had an allergic reaction to any of the components of the vaccine. If you have specific questions about the vaccine, please bring them up with your health care provider to discuss them.   We will likely not be getting the vaccine in the office for the first rounds of vaccinations. The way they are releasing the vaccines is going to be through the health systems (like [place], [facility], [facility], [facility]) or through your county health department.   [facility] is giving vaccines to those 75+ starting 10/27/19  M-F 7AM to 4PM [employer] [facility] [employer] [address] [place], [place] in a drive through tent  If you are 65+ you can get a vaccine through [facility] by signing up for an appointment.  You can sign up by going to: [email or web address].  You can get more information by going to: [email or web address]   Atrial Fibrillation  Atrial fibrillation is a type of heartbeat that is irregular or fast. If you have this condition, your heart beats without any order. This makes it hard for your heart to pump blood in a normal way. Atrial fibrillation may come and go, or it may become a long-lasting problem. If this condition is not treated, it can put you at higher risk for stroke, heart failure, and other heart problems. What are the causes? This condition may be caused by diseases that damage the heart. They include:  High blood pressure.  Heart failure.  Heart valve disease.  Heart surgery. Other causes include:  Diabetes.  Thyroid disease.  Being overweight.  Kidney disease. Sometimes the cause is not known. What increases the risk? You are more likely to develop this condition if:  You are older.  You smoke.  You exercise often and very hard.  You have a family history of this condition.  You are a man.  You use drugs.  You drink a lot of alcohol.  You have  lung conditions, such as emphysema, pneumonia, or COPD.  You have sleep apnea. What are the signs or symptoms? Common symptoms of this condition include:  A feeling that your heart is beating very fast.  Chest pain or discomfort.  Feeling short of breath.  Suddenly feeling light-headed or weak.  Getting tired easily during activity.  Fainting.  Sweating. In some cases, there are no symptoms. How is this treated? Treatment for this condition depends on underlying conditions and how you feel when you have atrial fibrillation. They include:  Medicines to: ? Prevent blood clots. ? Treat heart rate or heart rhythm problems.  Using devices, such as a pacemaker, to correct heart rhythm problems.  Doing surgery to remove the part of the heart that sends bad signals.  Closing an area where clots can form in the heart (left atrial appendage). In some cases, your doctor will treat other underlying conditions. Follow these instructions at home: Medicines  Take over-the-counter and prescription medicines only as told by your doctor.  Do not take any new medicines without first talking to your doctor.  If you are taking blood thinners: ? Talk with your doctor before you take any medicines that have aspirin or NSAIDs, such as ibuprofen, in them. ? Take your medicine exactly as told by your doctor. Take it at the same time each day. ? Avoid activities that could hurt or bruise you. Follow instructions about how to prevent falls. ? Wear a bracelet that says you  are taking blood thinners. Or, carry a card that lists what medicines you take. Lifestyle      Do not use any products that have nicotine or tobacco in them. These include cigarettes, e-cigarettes, and chewing tobacco. If you need help quitting, ask your doctor.  Eat heart-healthy foods. Talk with your doctor about the right eating plan for you.  Exercise regularly as told by your doctor.  Do not drink alcohol.  Lose  weight if you are overweight.  Do not use drugs, including cannabis. General instructions  If you have a condition that causes breathing to stop for a short period of time (apnea), treat it as told by your doctor.  Keep a healthy weight. Do not use diet pills unless your doctor says they are safe for you. Diet pills may make heart problems worse.  Keep all follow-up visits as told by your doctor. This is important. Contact a doctor if:  You notice a change in the speed, rhythm, or strength of your heartbeat.  You are taking a blood-thinning medicine and you get more bruising.  You get tired more easily when you move or exercise.  You have a sudden change in weight. Get help right away if:   You have pain in your chest or your belly (abdomen).  You have trouble breathing.  You have side effects of blood thinners, such as blood in your vomit, poop (stool), or pee (urine), or bleeding that cannot stop.  You have any signs of a stroke. "BE FAST" is an easy way to remember the main warning signs: ? B - Balance. Signs are dizziness, sudden trouble walking, or loss of balance. ? E - Eyes. Signs are trouble seeing or a change in how you see. ? F - Face. Signs are sudden weakness or loss of feeling in the face, or the face or eyelid drooping on one side. ? A - Arms. Signs are weakness or loss of feeling in an arm. This happens suddenly and usually on one side of the body. ? S - Speech. Signs are sudden trouble speaking, slurred speech, or trouble understanding what people say. ? T - Time. Time to call emergency services. Write down what time symptoms started.  You have other signs of a stroke, such as: ? A sudden, very bad headache with no known cause. ? Feeling like you may vomit (nausea). ? Vomiting. ? A seizure. These symptoms may be an emergency. Do not wait to see if the symptoms will go away. Get medical help right away. Call your local emergency services (911 in the [place] Do  not drive yourself to the hospital. Summary  Atrial fibrillation is a type of heartbeat that is irregular or fast.  You are at higher risk of this condition if you smoke, are older, have diabetes, or are overweight.  Follow your doctor's instructions about medicines, diet, exercise, and follow-up visits.  Get help right away if you have signs or symptoms of a stroke.  Get help right away if you cannot catch your breath, or you have chest pain or discomfort. This information is not intended to replace advice given to you by your health care provider. Make sure you discuss any questions you have with your health care provider. Document Revised: 03/31/2019 Document Reviewed: 03/31/2019 Elsevier Patient Education  [place].

## 2019-11-17 LAB — LIPID PANEL W/O CHOL/HDL RATIO
Cholesterol, Total: 186 mg/dL (ref 100–199)
HDL: 51 mg/dL (ref >39)
LDL Chol Calc (NIH): 108 mg/dL — ABNORMAL HIGH (ref 0–99)
Triglycerides: 152 mg/dL — ABNORMAL HIGH (ref 0–149)
VLDL Cholesterol Cal: 27 mg/dL (ref 5–40)

## 2019-11-17 LAB — CBC WITH DIFFERENTIAL/PLATELET
Basophils Absolute: 0.1 10*3/uL (ref 0.0–0.2)
Basos: 1 %
EOS (ABSOLUTE): 0.2 10*3/uL (ref 0.0–0.4)
Eos: 4 %
Hematocrit: 47.7 % — ABNORMAL HIGH (ref 34.0–46.6)
Hemoglobin: 15.5 g/dL (ref 11.1–15.9)
Immature Grans (Abs): 0 10*3/uL (ref 0.0–0.1)
Immature Granulocytes: 0 %
Lymphocytes Absolute: 1.3 10*3/uL (ref 0.7–3.1)
Lymphs: 22 %
MCH: 28.1 pg (ref 26.6–33.0)
MCHC: 32.5 g/dL (ref 31.5–35.7)
MCV: 87 fL (ref 79–97)
Monocytes Absolute: 0.4 10*3/uL (ref 0.1–0.9)
Monocytes: 7 %
Neutrophils Absolute: 3.9 10*3/uL (ref 1.4–7.0)
Neutrophils: 66 %
Platelets: 301 10*3/uL (ref 150–450)
RBC: 5.51 x10E6/uL — ABNORMAL HIGH (ref 3.77–5.28)
RDW: 13.3 % (ref 11.7–15.4)
WBC: 5.9 10*3/uL (ref 3.4–10.8)

## 2019-11-17 LAB — COMPREHENSIVE METABOLIC PANEL
ALT: 23 IU/L (ref 0–32)
AST: 24 IU/L (ref 0–40)
Albumin/Globulin Ratio: 2 (ref 1.2–2.2)
Albumin: 4.3 g/dL (ref 3.7–4.7)
Alkaline Phosphatase: 61 IU/L (ref 39–117)
BUN/Creatinine Ratio: 18 (ref 12–28)
BUN: 21 mg/dL (ref 8–27)
Bilirubin Total: 0.9 mg/dL (ref 0.0–1.2)
CO2: 21 mmol/L (ref 20–29)
Calcium: 9.9 mg/dL (ref 8.7–10.3)
Chloride: 101 mmol/L (ref 96–106)
Creatinine, Ser: 1.19 mg/dL — ABNORMAL HIGH (ref 0.57–1.00)
GFR calc Af Amer: 52 mL/min/{1.73_m2} — ABNORMAL LOW (ref >59)
GFR calc non Af Amer: 45 mL/min/{1.73_m2} — ABNORMAL LOW (ref >59)
Globulin, Total: 2.2 g/dL (ref 1.5–4.5)
Glucose: 149 mg/dL — ABNORMAL HIGH (ref 65–99)
Potassium: 4.2 mmol/L (ref 3.5–5.2)
Sodium: 140 mmol/L (ref 134–144)
Total Protein: 6.5 g/dL (ref 6.0–8.5)

## 2019-11-18 ENCOUNTER — Ambulatory Visit: Payer: Medicare PPO | Admitting: Family Medicine

## 2019-11-19 ENCOUNTER — Encounter: Payer: Self-pay | Admitting: Family Medicine

## 2019-11-20 DIAGNOSIS — I4891 Unspecified atrial fibrillation: Secondary | ICD-10-CM | POA: Insufficient documentation

## 2019-11-20 NOTE — Assessment & Plan Note (Signed)
Will start her on metoprolol and xarelto. Will get her into see cardiology. Call with any concerns. Continue to monitor. Recheck 1 month.

## 2019-11-26 ENCOUNTER — Encounter: Payer: Self-pay | Admitting: Cardiology

## 2019-11-26 ENCOUNTER — Other Ambulatory Visit: Payer: Self-pay

## 2019-11-26 ENCOUNTER — Ambulatory Visit (INDEPENDENT_AMBULATORY_CARE_PROVIDER_SITE_OTHER): Payer: Medicare PPO | Admitting: Cardiology

## 2019-11-26 VITALS — BP 133/71 | HR 105 | Ht 61.0 in | Wt 243.0 lb

## 2019-11-26 DIAGNOSIS — I4891 Unspecified atrial fibrillation: Secondary | ICD-10-CM | POA: Diagnosis not present

## 2019-11-26 DIAGNOSIS — I10 Essential (primary) hypertension: Secondary | ICD-10-CM | POA: Diagnosis not present

## 2019-11-26 DIAGNOSIS — Z79899 Other long term (current) drug therapy: Secondary | ICD-10-CM | POA: Diagnosis not present

## 2019-11-26 DIAGNOSIS — R600 Localized edema: Secondary | ICD-10-CM

## 2019-11-26 MED ORDER — FUROSEMIDE 40 MG PO TABS: 40.0000 mg | ORAL_TABLET | Freq: Every day | ORAL | 6 refills | Status: DC

## 2019-11-26 MED ORDER — METOPROLOL SUCCINATE ER 50 MG PO TB24: 50.0000 mg | ORAL_TABLET | Freq: Every day | ORAL | 6 refills | Status: DC

## 2019-11-26 NOTE — Progress Notes (Signed)
Cardiology Office Note:    Date:  11/26/2019   ID:  Karen Farley, DOB [birth year], MRN [id number]  PCP:  Karen Roys, DO  Cardiologist:  Karen Sable, MD  Electrophysiologist:  None   Referring MD: Karen Roys, DO   Chief Complaint  Patient presents with  . New Patient (Initial Visit)    Atrial Fibrillation; Meds verbally reviewed with patient.    History of Present Illness:    Karen Farley is a 74 y.o. female with a hx of CKD stage III, hypertension, hyperlipidemia, being seen due to atrial fibrillation.  Patient went to see PCP on January 26 and EKG noted to be in atrial fibrillation.  She denies any prior history of A. fib.  She was started on Toprol-XL 25 daily and Xarelto.  She now presents for follow-up.  She states having a long history of lower extremity swelling for years.  Denies chest pain but notes some shortness of breath with exertion.  Endorses palpitations and irregular heart rate.  Past Medical History:  Diagnosis Date  . 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 DDD (degenerative disc disease), lumbar   . Degenerative disc disease, lumbar    bulging and dengerated  . Diabetes mellitus without complication ([place])   . Hyperlipidemia   . Hypertension   . Osteoarthritis of both knees   . Osteopenia     Past Surgical History:  Procedure Laterality Date  . CHOLECYSTECTOMY    . DILATION AND CURETTAGE OF UTERUS    . TEAR DUCT PROBING WITH STRABISMUS REPAIR Right   . TONSILLECTOMY      Current Medications: Current Meds  Medication Sig  . benazepril (LOTENSIN) 20 MG tablet Take 1 tablet (20 mg total) by mouth daily.  Karen Farley Bandages & Supports (MEDICAL COMPRESSION STOCKINGS) MISC by Does not apply route.  . latanoprost (XALATAN) 0.005 % ophthalmic solution 1 drop at bedtime.  . metFORMIN (GLUCOPHAGE-XR) 500 MG 24 hr tablet Take 2 tablets (1,000 mg total) by mouth 2 (two) times daily.  . Multiple Vitamin (MULTIVITAMIN WITH  MINERALS) TABS tablet Take 1 tablet by mouth daily.  Glory Rosebush ULTRA test strip USE TO TEST EVERY DAY AS DIRECTED  . rivaroxaban (XARELTO) 20 MG TABS tablet Take 1 tablet (20 mg total) by mouth daily with supper.  . traMADol (ULTRAM) 50 MG tablet Take 1-2 tablets (50-100 mg total) by mouth every 6 (six) hours as needed.  . [DISCONTINUED] hydrochlorothiazide (HYDRODIURIL) 25 MG tablet Take 1 tablet (25 mg total) by mouth daily.  . [DISCONTINUED] metoprolol succinate (TOPROL-XL) 25 MG 24 hr tablet Take 1 tablet (25 mg total) by mouth daily.     Allergies:   Patient has no known allergies.   Social History   Socioeconomic History  . Marital status: Married    Spouse name: Not on file  . Number of children: Not on file  . Years of education: Not on file  . Highest education level: Not on file  Occupational History  . Not on file  Tobacco Use  . Smoking status: Never Smoker  . Smokeless tobacco: Never Used  Substance and Sexual Activity  . Alcohol use: Yes    Alcohol/week: 0.0 standard drinks    Comment: on rare occasion  . Drug use: No  . Sexual activity: Not Currently  Other Topics Concern  . Not on file  Social History Narrative  . Not on file   Social Determinants of  Health   Financial Resource Strain:   . Difficulty of Paying Living Expenses: Not on file  Food Insecurity:   . Worried About Charity fundraiser in the Last Year: Not on file  . Ran Out of Food in the Last Year: Not on file  Transportation Needs:   . Lack of Transportation (Medical): Not on file  . Lack of Transportation (Non-Medical): Not on file  Physical Activity:   . Days of Exercise per Week: Not on file  . Minutes of Exercise per Session: Not on file  Stress:   . Feeling of Stress : Not on file  Social Connections:   . Frequency of Communication with Friends and Family: Not on file  . Frequency of Social Gatherings with Friends and Family: Not on file  . Attends Religious Services: Not on file   . Active Member of Clubs or Organizations: Not on file  . Attends Archivist Meetings: Not on file  . Marital Status: Not on file     Family History: The patient's family history includes Breast cancer in her maternal grandmother and mother; Cancer in her brother; Heart disease in her brother and mother; Heart disease (age of onset: 76) in her father; Osteoporosis in her mother; Parkinson's disease in her brother.  ROS:   Please see the history of present illness.     All other systems reviewed and are negative.  EKGs/Labs/Other Studies Reviewed:    The following studies were reviewed today:   EKG:  EKG is  ordered today.  The ekg ordered today demonstrates atrial fibrillation, heart rate 105.  Recent Labs: 04/27/2019: TSH 2.970 11/16/2019: ALT 23; BUN 21; Creatinine, Ser 1.19; Hemoglobin 15.5; Platelets 301; Potassium 4.2; Sodium 140  Recent Lipid Panel    Component Value Date/Time   CHOL 186 11/16/2019 1419   CHOL 211 (H) 06/28/2016 1317   TRIG 152 (H) 11/16/2019 1419   TRIG 204 (H) 06/28/2016 1317   HDL 51 11/16/2019 1419   VLDL 41 (H) 06/28/2016 1317   LDLCALC 108 (H) 11/16/2019 1419    Physical Exam:    VS:  BP 133/71 (BP Location: Left Arm, Patient Position: Sitting, Cuff Size: Normal)   Pulse (!) 105   Ht 5\' 1"  (1.549 m)   Wt 243 lb (110.2 kg)   SpO2 96%   BMI 45.91 kg/m     Wt Readings from Last 3 Encounters:  11/26/19 243 lb (110.2 kg)  11/16/19 232 lb (105.2 kg)  11/15/19 230 lb (104.3 kg)     GEN:  Well nourished, well developed in no acute distress HEENT: Normal NECK: No JVD; No carotid bruits LYMPHATICS: No lymphadenopathy CARDIAC: Irregular irregular, no murmurs, rubs, gallops RESPIRATORY:  Clear to auscultation without rales, wheezing or rhonchi  ABDOMEN: Soft, non-tender, non-distended MUSCULOSKELETAL: 2+ edema; No deformity  SKIN: Warm and dry NEUROLOGIC:  Alert and oriented x 3 PSYCHIATRIC:  Normal affect   ASSESSMENT:    1.  Atrial fibrillation, unspecified type ([place])   2. Edema of both lower extremities   3. Essential hypertension   4. Medication management    PLAN:      1. Patient with new onset atrial fibrillation.  CHA2DS2-VASc of 4.  Increase Toprol-XL to 50 mg daily, continue Xarelto 20 mg daily.  Get echocardiogram. 2. Long history of lower extremity edema.  Get echocardiogram as above to evaluate any structural heart problems.  Start Lasix 40 mg daily.  Get BMP in 2 weeks prior to follow-up  visit. 3. Blood pressure okay.  Stop HCTZ due to starting Lasix 40 mg daily.  Increase Toprol-XL to 50 mg daily.  Follow-up in 2 weeks.  This note was generated in part or whole with voice recognition software. Voice recognition is usually quite accurate but there are transcription errors that can and very often do occur. I apologize for any typographical errors that were not detected and corrected.  Medication Adjustments/Labs and Tests Ordered: Current medicines are reviewed at length with the patient today.  Concerns regarding medicines are outlined above.  Orders Placed This Encounter  Procedures  . Basic metabolic panel  . EKG 12-Lead  . ECHOCARDIOGRAM COMPLETE   Meds ordered this encounter  Medications  . furosemide (LASIX) 40 MG tablet    Sig: Take 1 tablet (40 mg total) by mouth daily.    Dispense:  30 tablet    Refill:  6    Stopping HCTZ  . metoprolol succinate (TOPROL-XL) 50 MG 24 hr tablet    Sig: Take 1 tablet (50 mg total) by mouth daily. Take with or immediately following a meal.    Dispense:  30 tablet    Refill:  6    Patient Instructions  Medication Instructions:  - Your physician has recommended you make the following change in your medication:   1) Stop hydrochlorothiazide (HCTZ)  2) Start lasix (furosemide) 40 mg- take 1 tablet (40 mg) by mouth once daily  3) Increase toprol (metoprolol succinate) to 50 mg- take 1 tablet (50 mg) by mouth once daily  *If you need a refill on  your cardiac medications before your next appointment, please call your pharmacy*  Lab Work: - Your physician recommends that you return for lab work in: 2 weeks (1-2 days prior to your follow up appointment with Dr. Garen Lah) - BMP  - Medical Mall at [facility], 1st desk on the right (past the screening table) - Lab hours: Monday- Friday (7:30 am-5:30 pm)  If you have labs (blood work) drawn today and your tests are completely normal, you will receive your results only by: Marland Kitchen MyChart Message (if you have MyChart) OR . A paper copy in the mail If you have any lab test that is abnormal or we need to change your treatment, we will call you to review the results.  Testing/Procedures: - Your physician has requested that you have an echocardiogram (ok if not done prior to follow up in 2 weeks). Echocardiography is a painless test that uses sound waves to create images of your heart. It provides your doctor with information about the size and shape of your heart and how well your heart's chambers and valves are working. This procedure takes approximately one hour. There are no restrictions for this procedure.   Follow-Up: At [facility], you and your health needs are our priority.  As part of our continuing mission to provide you with exceptional heart care, we have created designated Provider Care Teams.  These Care Teams include your primary Cardiologist (physician) and Advanced Practice Providers (APPs -  Physician Assistants and Nurse Practitioners) who all work together to provide you with the care you need, when you need it.  Your next appointment:   2 week(s)  The format for your next appointment:   In Person  Provider:   Kate Sable, MD  Other Instructions n/a     Signed, Karen Sable, MD  11/26/2019 5:20 PM    [place]

## 2019-11-26 NOTE — Patient Instructions (Signed)
Medication Instructions:  - Your physician has recommended you make the following change in your medication:   1) Stop hydrochlorothiazide (HCTZ)  2) Start lasix (furosemide) 40 mg- take 1 tablet (40 mg) by mouth once daily  3) Increase toprol (metoprolol succinate) to 50 mg- take 1 tablet (50 mg) by mouth once daily  *If you need a refill on your cardiac medications before your next appointment, please call your pharmacy*  Lab Work: - Your physician recommends that you return for lab work in: 2 weeks (1-2 days prior to your follow up appointment with Dr. Garen Lah) - BMP  - Medical Mall at [facility], 1st desk on the right (past the screening table) - Lab hours: Monday- Friday (7:30 am-5:30 pm)  If you have labs (blood work) drawn today and your tests are completely normal, you will receive your results only by: Marland Kitchen MyChart Message (if you have MyChart) OR . A paper copy in the mail If you have any lab test that is abnormal or we need to change your treatment, we will call you to review the results.  Testing/Procedures: - Your physician has requested that you have an echocardiogram (ok if not done prior to follow up in 2 weeks). Echocardiography is a painless test that uses sound waves to create images of your heart. It provides your doctor with information about the size and shape of your heart and how well your heart's chambers and valves are working. This procedure takes approximately one hour. There are no restrictions for this procedure.   Follow-Up: At [facility], you and your health needs are our priority.  As part of our continuing mission to provide you with exceptional heart care, we have created designated Provider Care Teams.  These Care Teams include your primary Cardiologist (physician) and Advanced Practice Providers (APPs -  Physician Assistants and Nurse Practitioners) who all work together to provide you with the care you need, when you need it.  Your next appointment:    2 week(s)  The format for your next appointment:   In Person  Provider:   Kate Sable, MD  Other Instructions n/a

## 2019-12-01 ENCOUNTER — Ambulatory Visit (INDEPENDENT_AMBULATORY_CARE_PROVIDER_SITE_OTHER): Payer: Medicare PPO

## 2019-12-01 ENCOUNTER — Other Ambulatory Visit: Payer: Self-pay

## 2019-12-01 ENCOUNTER — Telehealth: Payer: Self-pay | Admitting: Cardiology

## 2019-12-01 DIAGNOSIS — I4891 Unspecified atrial fibrillation: Secondary | ICD-10-CM | POA: Diagnosis not present

## 2019-12-01 MED ORDER — PERFLUTREN LIPID MICROSPHERE
2.0000 mL | INTRAVENOUS | Status: AC | PRN
  Administered 2019-12-01: 2 mL via INTRAVENOUS

## 2019-12-01 NOTE — Telephone Encounter (Signed)
No answer. Left message to call back.   

## 2019-12-01 NOTE — Telephone Encounter (Signed)
Patient in office for test Patient had labs done at [facility] 11/16/19 Would like to know if she still has to have her labs done again or if everything was completed at Dr Rance Muir office Please call to discuss

## 2019-12-02 ENCOUNTER — Telehealth: Payer: Self-pay

## 2019-12-02 NOTE — Telephone Encounter (Signed)
Spoke to patient. No further questions or orders at this time.   Advised pt to call for any further questions or concerns.

## 2019-12-02 NOTE — Telephone Encounter (Signed)
-----   Message from Kate Sable, MD sent at 12/02/2019  1:30 PM EST ----- Normal systolic function, moderately dilated left atrium.  Keep follow-up appointment.

## 2019-12-02 NOTE — Telephone Encounter (Signed)
Attempted to call patient. LMTCB 12/02/2019   

## 2019-12-07 NOTE — Telephone Encounter (Signed)
See 12/02/19 telephone encounter.

## 2019-12-08 ENCOUNTER — Other Ambulatory Visit
Admission: RE | Admit: 2019-12-08 | Discharge: 2019-12-08 | Disposition: A | Discharge status: Home / Self Care | Payer: Medicare PPO | Source: Ambulatory Visit | Attending: Cardiology | Admitting: Cardiology

## 2019-12-08 DIAGNOSIS — I10 Essential (primary) hypertension: Secondary | ICD-10-CM

## 2019-12-08 DIAGNOSIS — R600 Localized edema: Secondary | ICD-10-CM | POA: Diagnosis not present

## 2019-12-08 DIAGNOSIS — I4891 Unspecified atrial fibrillation: Secondary | ICD-10-CM | POA: Insufficient documentation

## 2019-12-08 LAB — BASIC METABOLIC PANEL
Anion gap: 13 (ref 5–15)
BUN: 30 mg/dL — ABNORMAL HIGH (ref 8–23)
CO2: 26 mmol/L (ref 22–32)
Calcium: 9.3 mg/dL (ref 8.9–10.3)
Chloride: 100 mmol/L (ref 98–111)
Creatinine, Ser: 1.23 mg/dL — ABNORMAL HIGH (ref 0.44–1.00)
GFR calc Af Amer: 50 mL/min — ABNORMAL LOW (ref >60)
GFR calc non Af Amer: 43 mL/min — ABNORMAL LOW (ref >60)
Glucose, Bld: 145 mg/dL — ABNORMAL HIGH (ref 70–99)
Potassium: 4.2 mmol/L (ref 3.5–5.1)
Sodium: 139 mmol/L (ref 135–145)

## 2019-12-09 ENCOUNTER — Telehealth: Payer: Self-pay | Admitting: *Deleted

## 2019-12-09 NOTE — Telephone Encounter (Signed)
Results called to pt. Pt verbalized understanding.  

## 2019-12-09 NOTE — Telephone Encounter (Signed)
No answer. Left message to call back.   

## 2019-12-09 NOTE — Telephone Encounter (Signed)
-----   Message from Kate Sable, MD sent at 12/08/2019  4:48 PM EST ----- Labs reviewed.  No significant change from prior.  Continue medications including the Lasix as prescribed.  Keep follow-up appointment.

## 2019-12-10 ENCOUNTER — Ambulatory Visit (INDEPENDENT_AMBULATORY_CARE_PROVIDER_SITE_OTHER): Payer: Medicare PPO | Admitting: Cardiology

## 2019-12-10 ENCOUNTER — Other Ambulatory Visit: Payer: Self-pay

## 2019-12-10 ENCOUNTER — Encounter: Payer: Self-pay | Admitting: Cardiology

## 2019-12-10 VITALS — BP 110/80 | HR 90 | Ht 61.5 in | Wt 231.0 lb

## 2019-12-10 DIAGNOSIS — I10 Essential (primary) hypertension: Secondary | ICD-10-CM

## 2019-12-10 DIAGNOSIS — I4891 Unspecified atrial fibrillation: Secondary | ICD-10-CM

## 2019-12-10 DIAGNOSIS — R600 Localized edema: Secondary | ICD-10-CM | POA: Diagnosis not present

## 2019-12-10 NOTE — Progress Notes (Signed)
Cardiology Office Note:    Date:  12/10/2019   ID:  Karen Farley, DOB 03-16-1946, MRN [id number]  PCP:  Valerie Roys, DO  Cardiologist:  Kate Sable, MD  Electrophysiologist:  None   Referring MD: Valerie Roys, DO   Chief Complaint  Patient presents with  . other    2 week follow up and discuss Echo. "doing well."  Pt. c/o LE edema.     History of Present Illness:    Karen Farley is a 74 y.o. female with a hx of CKD stage III, hypertension, A. fib, hyperlipidemia, being seen for follow-up.  She was last seen due to atrial fibrillation and lower extremity edema.    Patient saw PCP on January 26 and EKG noted to be in atrial fibrillation.  She was started on Toprol-XL and Xarelto. She states having a long history of lower extremity swelling for years.  Denies chest pain but notes some shortness of breath with exertion.  Endorses palpitations and irregular heart rate.  Lasix 40 mg daily was started for edema.  TTEcho showed normal systolic function with ejection fraction of EF 60 to 65%, moderately dilated LA. states her edema has improved since starting Lasix.  She has lost about 10 pounds.   Past Medical History:  Diagnosis Date  . 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 DDD (degenerative disc disease), lumbar   . Degenerative disc disease, lumbar    bulging and dengerated  . Diabetes mellitus without complication ([place])   . Hyperlipidemia   . Hypertension   . Osteoarthritis of both knees   . Osteopenia     Past Surgical History:  Procedure Laterality Date  . CHOLECYSTECTOMY    . DILATION AND CURETTAGE OF UTERUS    . TEAR DUCT PROBING WITH STRABISMUS REPAIR Right   . TONSILLECTOMY      Current Medications: Current Meds  Medication Sig  . benazepril (LOTENSIN) 20 MG tablet Take 1 tablet (20 mg total) by mouth daily.  Regino Schultze Bandages & Supports (MEDICAL COMPRESSION STOCKINGS) MISC by Does not apply route.  . furosemide (LASIX)  40 MG tablet Take 1 tablet (40 mg total) by mouth daily.  Marland Kitchen latanoprost (XALATAN) 0.005 % ophthalmic solution 1 drop at bedtime.  . metFORMIN (GLUCOPHAGE-XR) 500 MG 24 hr tablet Take 2 tablets (1,000 mg total) by mouth 2 (two) times daily.  . metoprolol succinate (TOPROL-XL) 50 MG 24 hr tablet Take 1 tablet (50 mg total) by mouth daily. Take with or immediately following a meal.  . Multiple Vitamin (MULTIVITAMIN WITH MINERALS) TABS tablet Take 1 tablet by mouth daily.  Glory Rosebush ULTRA test strip USE TO TEST EVERY DAY AS DIRECTED  . rivaroxaban (XARELTO) 20 MG TABS tablet Take 1 tablet (20 mg total) by mouth daily with supper.  . traMADol (ULTRAM) 50 MG tablet Take 1-2 tablets (50-100 mg total) by mouth every 6 (six) hours as needed.     Allergies:   Patient has no known allergies.   Social History   Socioeconomic History  . Marital status: Married    Spouse name: Not on file  . Number of children: Not on file  . Years of education: Not on file  . Highest education level: Not on file  Occupational History  . Not on file  Tobacco Use  . Smoking status: Never Smoker  . Smokeless tobacco: Never Used  Substance and Sexual Activity  . Alcohol use: Yes  Alcohol/week: 0.0 standard drinks    Comment: on rare occasion  . Drug use: No  . Sexual activity: Not Currently  Other Topics Concern  . Not on file  Social History Narrative  . Not on file   Social Determinants of Health   Financial Resource Strain:   . Difficulty of Paying Living Expenses: Not on file  Food Insecurity:   . Worried About Charity fundraiser in the Last Year: Not on file  . Ran Out of Food in the Last Year: Not on file  Transportation Needs:   . Lack of Transportation (Medical): Not on file  . Lack of Transportation (Non-Medical): Not on file  Physical Activity:   . Days of Exercise per Week: Not on file  . Minutes of Exercise per Session: Not on file  Stress:   . Feeling of Stress : Not on file  Social  Connections:   . Frequency of Communication with Friends and Family: Not on file  . Frequency of Social Gatherings with Friends and Family: Not on file  . Attends Religious Services: Not on file  . Active Member of Clubs or Organizations: Not on file  . Attends Archivist Meetings: Not on file  . Marital Status: Not on file     Family History: The patient's family history includes Breast cancer in her maternal grandmother and mother; Cancer in her brother; Heart disease in her brother and mother; Heart disease (age of onset: 25) in her father; Osteoporosis in her mother; Parkinson's disease in her brother.  ROS:   Please see the history of present illness.     All other systems reviewed and are negative.  EKGs/Labs/Other Studies Reviewed:    The following studies were reviewed today:   EKG:  EKG is  ordered today.  The ekg ordered today demonstrates atrial fibrillation, heart rate 90.  Recent Labs: 04/27/2019: TSH 2.970 11/16/2019: ALT 23; Hemoglobin 15.5; Platelets 301 12/08/2019: BUN 30; Creatinine, Ser 1.23; Potassium 4.2; Sodium 139  Recent Lipid Panel    Component Value Date/Time   CHOL 186 11/16/2019 1419   CHOL 211 (H) 06/28/2016 1317   TRIG 152 (H) 11/16/2019 1419   TRIG 204 (H) 06/28/2016 1317   HDL 51 11/16/2019 1419   VLDL 41 (H) 06/28/2016 1317   LDLCALC 108 (H) 11/16/2019 1419    Physical Exam:    VS:  BP 110/80 (BP Location: Right Arm, Patient Position: Sitting, Cuff Size: Large)   Pulse 90   Ht 5' 1.5" (1.562 m)   Wt 231 lb (104.8 kg)   SpO2 98%   BMI 42.94 kg/m     Wt Readings from Last 3 Encounters:  12/10/19 231 lb (104.8 kg)  11/26/19 243 lb (110.2 kg)  11/16/19 232 lb (105.2 kg)     GEN:  Well nourished, well developed in no acute distress HEENT: Normal NECK: No JVD; No carotid bruits LYMPHATICS: No lymphadenopathy CARDIAC: Irregular irregular, no murmurs, rubs, gallops RESPIRATORY:  Clear to auscultation without rales, wheezing or  rhonchi  ABDOMEN: Soft, non-tender, non-distended MUSCULOSKELETAL: 2+ edema; No deformity  SKIN: Warm and dry NEUROLOGIC:  Alert and oriented x 3 PSYCHIATRIC:  Normal affect   ASSESSMENT:    1. Atrial fibrillation, unspecified type ([place])   2. Edema of both lower extremities   3. Essential hypertension    PLAN:      1. Patient with persistent atrial fibrillation.  CHA2DS2-VASc of 4.  Continue Toprol-XL to 50 mg daily, continue Xarelto  20 mg daily.  Echo with normal EF, moderately dilated left atrium. 2. Long history of lower extremity edema.  Echo with normal ejection fraction, could not evaluate diastolic function.  Continue Lasix 40 mg daily.  Patient will follow up with PCP regarding history of kidney disease.  Recommend nephrology referral. 3. Blood pressure okay.  Continue Benicar, Lasix, Toprol-XL to 50 mg daily.  Follow-up 3 months  This note was generated in part or whole with voice recognition software. Voice recognition is usually quite accurate but there are transcription errors that can and very often do occur. I apologize for any typographical errors that were not detected and corrected.  Medication Adjustments/Labs and Tests Ordered: Current medicines are reviewed at length with the patient today.  Concerns regarding medicines are outlined above.  Orders Placed This Encounter  Procedures  . EKG 12-Lead   No orders of the defined types were placed in this encounter.   Patient Instructions  Medication Instructions:  - Your physician recommends that you continue on your current medications as directed. Please refer to the Current Medication list given to you today.  *If you need a refill on your cardiac medications before your next appointment, please call your pharmacy*  Lab Work: - none ordered  If you have labs (blood work) drawn today and your tests are completely normal, you will receive your results only by: Marland Kitchen MyChart Message (if you have MyChart) OR . A  paper copy in the mail If you have any lab test that is abnormal or we need to change your treatment, we will call you to review the results.  Testing/Procedures: - none ordered  Follow-Up: At [facility], you and your health needs are our priority.  As part of our continuing mission to provide you with exceptional heart care, we have created designated Provider Care Teams.  These Care Teams include your primary Cardiologist (physician) and Advanced Practice Providers (APPs -  Physician Assistants and Nurse Practitioners) who all work together to provide you with the care you need, when you need it.  Your next appointment:   3 month(s)  The format for your next appointment:   In Person  Provider:   Kate Sable, MD  Other Instructions n/a     Signed, Kate Sable, MD  12/10/2019 4:36 PM    [place]

## 2019-12-10 NOTE — Patient Instructions (Signed)
Medication Instructions:  - Your physician recommends that you continue on your current medications as directed. Please refer to the Current Medication list given to you today.  *If you need a refill on your cardiac medications before your next appointment, please call your pharmacy*  Lab Work: - none ordered  If you have labs (blood work) drawn today and your tests are completely normal, you will receive your results only by: Marland Kitchen MyChart Message (if you have MyChart) OR . A paper copy in the mail If you have any lab test that is abnormal or we need to change your treatment, we will call you to review the results.  Testing/Procedures: - none ordered  Follow-Up: At [facility], you and your health needs are our priority.  As part of our continuing mission to provide you with exceptional heart care, we have created designated Provider Care Teams.  These Care Teams include your primary Cardiologist (physician) and Advanced Practice Providers (APPs -  Physician Assistants and Nurse Practitioners) who all work together to provide you with the care you need, when you need it.  Your next appointment:   3 month(s)  The format for your next appointment:   In Person  Provider:   Kate Sable, MD  Other Instructions n/a

## 2019-12-17 ENCOUNTER — Ambulatory Visit: Payer: Medicare PPO | Admitting: Family Medicine

## 2019-12-17 ENCOUNTER — Encounter: Payer: Self-pay | Admitting: Family Medicine

## 2019-12-17 ENCOUNTER — Other Ambulatory Visit: Payer: Self-pay

## 2019-12-17 VITALS — BP 110/72 | HR 89 | Temp 97.7°F

## 2019-12-17 DIAGNOSIS — I129 Hypertensive chronic kidney disease with stage 1 through stage 4 chronic kidney disease, or unspecified chronic kidney disease: Secondary | ICD-10-CM | POA: Diagnosis not present

## 2019-12-17 DIAGNOSIS — M179 Osteoarthritis of knee, unspecified: Secondary | ICD-10-CM | POA: Insufficient documentation

## 2019-12-17 DIAGNOSIS — E1122 Type 2 diabetes mellitus with diabetic chronic kidney disease: Secondary | ICD-10-CM | POA: Diagnosis not present

## 2019-12-17 DIAGNOSIS — M1710 Unilateral primary osteoarthritis, unspecified knee: Secondary | ICD-10-CM | POA: Insufficient documentation

## 2019-12-17 DIAGNOSIS — I4891 Unspecified atrial fibrillation: Secondary | ICD-10-CM | POA: Diagnosis not present

## 2019-12-17 DIAGNOSIS — N183 Chronic kidney disease, stage 3 unspecified: Secondary | ICD-10-CM

## 2019-12-17 DIAGNOSIS — M4310 Spondylolisthesis, site unspecified: Secondary | ICD-10-CM | POA: Insufficient documentation

## 2019-12-17 DIAGNOSIS — M5416 Radiculopathy, lumbar region: Secondary | ICD-10-CM | POA: Insufficient documentation

## 2019-12-17 NOTE — Assessment & Plan Note (Addendum)
Discussed referral to nephrology. She would like to hold on going to see them right now as she has seen a lot of doctors recently. Kidney function has been stable. Will continue to monitor closely and send her to nephrology if dropping at all.

## 2019-12-17 NOTE — Assessment & Plan Note (Signed)
Better on recheck. Continue current regimen. Continue to monitor. Call with any concerns.  

## 2019-12-17 NOTE — Assessment & Plan Note (Signed)
Tolerating the xarelto well. Continue to follow with cardiology. Call with any concerns. Checking CBC today.

## 2019-12-17 NOTE — Progress Notes (Signed)
BP 110/72 (BP Location: Left Arm, Cuff Size: Large)   Pulse 89   Temp 97.7 F (36.5 C) (Oral)   SpO2 97%    Subjective:    Patient ID: Karen Farley, female    DOB: 18-Oct-1946, 74 y.o.   MRN: [id number]  HPI: Karen Farley is a 74 y.o. female  Chief Complaint  Patient presents with  . Hypertension  . Atrial Fibrillation   HYPERTENSION Hypertension status: better  Satisfied with current treatment? yes Duration of hypertension: chronic BP monitoring frequency:  not checking BP medication side effects:  no Medication compliance: excellent compliance Previous BP meds:metoprolol, lasix Aspirin: no Recurrent headaches: no Visual changes: no Palpitations: no Dyspnea: no Chest pain: no Lower extremity edema: yes Dizzy/lightheaded: no  ATRIAL FIBRILLATION- established with cardiology. Metoprolol increased and started on lasix. Has been doing well with swelling ECHO done Atrial fibrillation status: better Satisfied with current treatment: yes  Medication side effects:  no Medication compliance: excellent compliance Palpitations:  no Chest pain:  no Dyspnea on exertion:  no Orthopnea:  no Syncope:  no Edema:  yes Ventricular rate control: B-blocker Anti-coagulation: long acting  Relevant past medical, surgical, family and social history reviewed and updated as indicated. Interim medical history since our last visit reviewed. Allergies and medications reviewed and updated.  Review of Systems  Constitutional: Negative.   Respiratory: Negative.   Cardiovascular: Positive for leg swelling. Negative for chest pain and palpitations.  Gastrointestinal: Negative.   Musculoskeletal: Negative.   Psychiatric/Behavioral: Negative.     Per HPI unless specifically indicated above     Objective:    BP 110/72 (BP Location: Left Arm, Cuff Size: Large)   Pulse 89   Temp 97.7 F (36.5 C) (Oral)   SpO2 97%   Wt Readings from Last 3 Encounters:  12/10/19 231 lb  (104.8 kg)  11/26/19 243 lb (110.2 kg)  11/16/19 232 lb (105.2 kg)    Physical Exam Vitals and nursing note reviewed.  Constitutional:      General: She is not in acute distress.    Appearance: Normal appearance. She is not ill-appearing, toxic-appearing or diaphoretic.  HENT:     Head: Normocephalic and atraumatic.     Right Ear: External ear normal.     Left Ear: External ear normal.     Nose: Nose normal.     Mouth/Throat:     Mouth: Mucous membranes are moist.     Pharynx: Oropharynx is clear.  Eyes:     General: No scleral icterus.       Right eye: No discharge.        Left eye: No discharge.     Extraocular Movements: Extraocular movements intact.     Conjunctiva/sclera: Conjunctivae normal.     Pupils: Pupils are equal, round, and reactive to light.  Cardiovascular:     Rate and Rhythm: Normal rate and regular rhythm.     Pulses: Normal pulses.     Heart sounds: Normal heart sounds. No murmur. No friction rub. No gallop.   Pulmonary:     Effort: Pulmonary effort is normal. No respiratory distress.     Breath sounds: Normal breath sounds. No stridor. No wheezing, rhonchi or rales.  Chest:     Chest wall: No tenderness.  Musculoskeletal:        General: Normal range of motion.     Cervical back: Normal range of motion and neck supple.  Skin:    General: Skin is warm and  dry.     Capillary Refill: Capillary refill takes less than 2 seconds.     Coloration: Skin is not jaundiced or pale.     Findings: No bruising, erythema, lesion or rash.  Neurological:     General: No focal deficit present.     Mental Status: She is alert and oriented to person, place, and time. Mental status is at baseline.  Psychiatric:        Mood and Affect: Mood normal.        Behavior: Behavior normal.        Thought Content: Thought content normal.        Judgment: Judgment normal.     Results for orders placed or performed in visit on 12/14/19  HM DEXA SCAN  Result Value Ref Range    HM Dexa Scan See report in chart       Assessment & Plan:   Problem List Items Addressed This Visit      Cardiovascular and Mediastinum   New onset atrial fibrillation ([place]) - Primary    Tolerating the xarelto well. Continue to follow with cardiology. Call with any concerns. Checking CBC today.      Relevant Orders   CBC with Differential/Platelet   Referral to Chronic Care Management Services     Endocrine   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 diabetes mellitus [facility])    Discussed referral to nephrology. She would like to hold on going to see them right now as she has seen a lot of doctors recently. Kidney function has been stable. Will continue to monitor closely and send her to nephrology if dropping at all.         Genitourinary   Benign hypertensive renal disease    Better on recheck. Continue current regimen. Continue to monitor. Call with any concerns.           Follow up plan: Return in about 2 months (around 02/14/2020) for DM follow up.

## 2019-12-18 LAB — CBC WITH DIFFERENTIAL/PLATELET
Basophils Absolute: 0.1 10*3/uL (ref 0.0–0.2)
Basos: 1 %
EOS (ABSOLUTE): 0.5 10*3/uL — ABNORMAL HIGH (ref 0.0–0.4)
Eos: 7 %
Hematocrit: 47.2 % — ABNORMAL HIGH (ref 34.0–46.6)
Hemoglobin: 15.8 g/dL (ref 11.1–15.9)
Immature Grans (Abs): 0 10*3/uL (ref 0.0–0.1)
Immature Granulocytes: 0 %
Lymphocytes Absolute: 1.3 10*3/uL (ref 0.7–3.1)
Lymphs: 18 %
MCH: 28.7 pg (ref 26.6–33.0)
MCHC: 33.5 g/dL (ref 31.5–35.7)
MCV: 86 fL (ref 79–97)
Monocytes Absolute: 0.7 10*3/uL (ref 0.1–0.9)
Monocytes: 10 %
Neutrophils Absolute: 4.5 10*3/uL (ref 1.4–7.0)
Neutrophils: 64 %
Platelets: 309 10*3/uL (ref 150–450)
RBC: 5.5 x10E6/uL — ABNORMAL HIGH (ref 3.77–5.28)
RDW: 13.6 % (ref 11.7–15.4)
WBC: 7 10*3/uL (ref 3.4–10.8)

## 2019-12-20 ENCOUNTER — Telehealth: Payer: Self-pay | Admitting: Family Medicine

## 2019-12-20 ENCOUNTER — Encounter: Payer: Self-pay | Admitting: Family Medicine

## 2019-12-20 NOTE — Chronic Care Management (AMB) (Signed)
  Chronic Care Management   Note  12/20/2019 Name: KRYSTAL TEACHEY MRN: [id number] DOB: 16-Aug-1946  AUDRIELLE VANKUREN is a 74 y.o. year old female who is a primary care patient of Valerie Roys, DO. I reached out to [name] by phone today in response to a referral sent by Ms. Sharyn Blitz Brave's PCP, Park Liter DO     Ms. Mcwilliams was given information about Chronic Care Management services today including:  1. CCM service includes personalized support from designated clinical staff supervised by her physician, including individualized plan of care and coordination with other care providers 2. 24/7 contact phone numbers for assistance for urgent and routine care needs. 3. Service will only be billed when office clinical staff spend 20 minutes or more in a month to coordinate care. 4. Only one practitioner may furnish and bill the service in a calendar month. 5. The patient may stop CCM services at any time (effective at the end of the month) by phone call to the office staff. 6. The patient will be responsible for cost sharing (co-pay) of up to 20% of the service fee (after annual deductible is met).  Patient agreed to services and verbal consent obtained.   Follow up plan: Telephone appointment with care management team member scheduled for:01/21/2020  Glenna Durand, [place] Management ??[email or web address] ??[phone]

## 2020-01-04 ENCOUNTER — Other Ambulatory Visit: Payer: Self-pay | Admitting: Family Medicine

## 2020-01-04 NOTE — Telephone Encounter (Signed)
Requested medication (s) are due for refill today: yes  Requested medication (s) are on the active medication list: yes  Last refill: 04/27/2019   #30  1 refill  Future visit scheduled Yes 01/21/2020  Notes to clinic:not delegated  Requested Prescriptions  Pending Prescriptions Disp Refills   traMADol (ULTRAM) 50 MG tablet [Pharmacy Med Name: TRAMADOL 50MG  TABLETS] 30 tablet     Sig: TAKE 1 TO 2 TABLETS(50 TO 100 MG) BY MOUTH EVERY 6 HOURS AS NEEDED      Not Delegated - Analgesics:  Opioid Agonists Failed - 01/04/2020  5:28 PM      Failed - This refill cannot be delegated      Failed - Urine Drug Screen completed in last 360 days.      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

## 2020-01-05 NOTE — Telephone Encounter (Signed)
Routing to provider  

## 2020-01-10 ENCOUNTER — Other Ambulatory Visit: Payer: Self-pay | Admitting: Family Medicine

## 2020-01-10 MED ORDER — TRAMADOL HCL 50 MG PO TABS: 50.0000 mg | ORAL_TABLET | Freq: Four times a day (QID) | ORAL | 0 refills | Status: DC | PRN

## 2020-01-10 NOTE — Telephone Encounter (Signed)
Pharmacy faxed another refill request for Tramadol.  LOV: 12/17/19 Next Appt: 05/16/2020

## 2020-01-10 NOTE — Telephone Encounter (Signed)
Called pt to schedule appt, she states that at he last appt she was informed to call us to let us know when she needed more medicine. Pt states that she is scheduled for 02/14/20 and will wait, doesn't see a need of making another appt.

## 2020-01-10 NOTE — Telephone Encounter (Signed)
appt

## 2020-01-10 NOTE — Telephone Encounter (Signed)
Called pt advised her that rx has been sent to pharmacy for 30 tablets pt will keep appt

## 2020-01-19 ENCOUNTER — Ambulatory Visit (INDEPENDENT_AMBULATORY_CARE_PROVIDER_SITE_OTHER): Payer: Medicare PPO | Admitting: Pharmacist

## 2020-01-19 DIAGNOSIS — E782 Mixed hyperlipidemia: Secondary | ICD-10-CM

## 2020-01-19 DIAGNOSIS — I129 Hypertensive chronic kidney disease with stage 1 through stage 4 chronic kidney disease, or unspecified chronic kidney disease: Secondary | ICD-10-CM | POA: Diagnosis not present

## 2020-01-19 DIAGNOSIS — N182 Chronic kidney disease, stage 2 (mild): Secondary | ICD-10-CM | POA: Diagnosis not present

## 2020-01-19 DIAGNOSIS — I4891 Unspecified atrial fibrillation: Secondary | ICD-10-CM

## 2020-01-19 DIAGNOSIS — E1122 Type 2 diabetes mellitus with diabetic chronic kidney disease: Secondary | ICD-10-CM

## 2020-01-19 NOTE — Chronic Care Management (AMB) (Signed)
Chronic Care Management   Note  01/19/2020 Name: Karen Farley MRN: [id number] DOB: 04-26-1946   Subjective:  Karen Farley is a 74 y.o. year old female who is a primary care patient of Valerie Roys, DO. The CCM team was consulted for assistance with chronic disease management and care coordination needs.   Contacted patient for medication access review.   Review of patient status, including review of consultants reports, laboratory and other test data, was performed as part of comprehensive evaluation and provision of chronic care management services.   SDOH (Social Determinants of Health) assessments and interventions performed:    Objective:  Lab Results  Component Value Date   CREATININE 1.23 (H) 12/08/2019   CREATININE 1.19 (H) 11/16/2019   CREATININE 1.31 (H) 04/27/2019    Lab Results  Component Value Date   HGBA1C 6.5 11/16/2019       Component Value Date/Time   CHOL 186 11/16/2019 1419   CHOL 211 (H) 06/28/2016 1317   TRIG 152 (H) 11/16/2019 1419   TRIG 204 (H) 06/28/2016 1317   HDL 51 11/16/2019 1419   VLDL 41 (H) 06/28/2016 1317   LDLCALC 108 (H) 11/16/2019 1419    Clinical ASCVD: No  The 10-year ASCVD risk score Mikey Bussing DC Jr., et al., 2013) is: 23.4%   Values used to calculate the score:     Age: 79 years     Sex: Female     Is Non-Hispanic African American: No     Diabetic: Yes     Tobacco smoker: No     Systolic Blood Pressure: 621 mmHg     Is BP treated: Yes     HDL Cholesterol: 51 mg/dL     Total Cholesterol: 186 mg/dL    BP Readings from Last 3 Encounters:  12/17/19 110/72  12/10/19 110/80  11/26/19 133/71    No Known Allergies  Medications Reviewed Today    Reviewed by De Hollingshead, [facility] (Pharmacist) on 01/19/20 at 1438  Med List Status: <None>  Medication Order Taking? Sig Documenting Provider Last Dose Status Informant  benazepril (LOTENSIN) 20 MG tablet [id number] Yes Take 1 tablet (20 mg total) by mouth daily.  Valerie Roys, DO Taking Active   Elastic Bandages & Supports ([place]) [place] [id number]  by Does not apply route. Johnson, [name], DO  Active   furosemide (LASIX) 40 MG tablet [id number] Yes Take 1 tablet (40 mg total) by mouth daily. Kate Sable, MD Taking Active   latanoprost (XALATAN) 0.005 % ophthalmic solution [id number] Yes 1 drop at bedtime. [provider] Taking Active   metFORMIN (GLUCOPHAGE-XR) 500 MG 24 hr tablet [id number] Yes Take 2 tablets (1,000 mg total) by mouth 2 (two) times daily. Johnson, [name], DO Taking Active   metoprolol succinate (TOPROL-XL) 50 MG 24 hr tablet [id number] Yes Take 1 tablet (50 mg total) by mouth daily. Take with or immediately following a meal. Agbor-Etang, Aaron Edelman, MD Taking Active   Multiple Vitamin (MULTIVITAMIN WITH MINERALS) TABS tablet [id number] Yes Take 1 tablet by mouth daily. [provider] Taking Active   [facility] ULTRA test strip [id number] Yes USE TO TEST EVERY DAY AS DIRECTED Wynetta Emery, [name], DO Taking Active   rivaroxaban (XARELTO) 20 MG TABS tablet [id number] Yes Take 1 tablet (20 mg total) by mouth daily with supper. [place] P, DO Taking Active   traMADol (ULTRAM) 50 MG tablet [id number] Yes Take 1-2 tablets (50-100 mg total) by mouth every 6 (six) hours  as needed. Valerie Roys, DO Taking Active   Med List Note Ignatius Specking, RN 02/07/16 1308): UDS 01-09-2016 as new patient  Primary doctor does not prescribe meds           Assessment:   Goals Addressed            This Visit's Progress     Patient Stated   . PharmD "I want to afford my medications" (pt-stated)       CARE PLAN ENTRY (see longtitudinal plan of care for additional care plan information)  Current Barriers:  . Polypharmacy; complex patient with multiple comorbidities including new onset atrial fibrillation, HTN, T2DM, CKD, chronic pain, HLD  . New onset Afib found by PCP; started on Xarelto and metoprolol  and now followed by cardiology Dr. Garen Lah. Reports copay for Xarelto is $40/month, wonders if there is anything cheaper . Most recent eGFR: 57 o Afib/HTN, some LEE but preserved ED: CHADS2VASc (4- age, female, HTN, DM); metoprolol succinate 50 mg daily, benazepril 40 mg daily, furosemide 40 mg daily; Xarelto 20 mg daily (eGFR right at cut off for dose reduction); reports concerns w/ copay o T2DM: last A1c 6.5%; metformin XR 1000 mg BID o ASCVD risk reduction: previously declined statin therapy; 10 year ASCVD risk 23% o Chronic pain: tramadol 50 mg PRN  Pharmacist Clinical Goal(s):  Marland Kitchen Over the next 90 days, patient will work with PharmD and provider towards optimized medication management  Interventions: . Comprehensive medication review performed; medication list updated in electronic medical record . Discussed afib, including physiologic reason for anticoagulant and beta blocker therapy.  . Reviewed Xarelto patient assistance program criteria. Patient meets income criteria, but does not meet out of pocket spend requirement right now (program requires that patient spend 4% of total yearly household income on copays prior to approval). Discussed that Eliquis program is more attainable (only 3% of total household income required). Mailing written financial information to patient; she will review her household income and we will discuss moving forward. Noted that the program for Eliquis will also accept her husband's copay spend towards the total.  . Patient asked how her lab work looked. We reviewed CKD, but spent a great amount of time talking about cholesterol, as well as ASCVD risk and benefit of statin therapy given DM and risk factors. Explained mechanism of statins, and that we would choose statins (eg rosuvastatin) less likely to cause muscle symptoms. Patient concerned about adding another life long medication, but notes that she will contemplate this and discuss more w/ Dr. Wynetta Emery at  upcoming appointment in April. We discussed moderate intensity option of rosuvastatin 5 mg daily  Patient Self Care Activities:  . Patient will take medications as prescribed  Initial goal documentation        Plan: - Scheduled f/u call 03/21/20  Catie Darnelle Maffucci, PharmD, [place] [phone]

## 2020-01-19 NOTE — Patient Instructions (Addendum)
Ms. Zehner,   It was great taking to you today!  We talked about a couple of things:   1) Xarelto patient assistance program: the income requirement is 300% of the federal poverty line, which is $52,260 for a 2 person household in 2021. The company also requires you spend 4% of your total yearly household income on copays (for all medications combined, not just Xarelto). The program for an alternative medication, Eliquis, only requires a 3% spend. They will also take into consideration your husband's copay spend, and seem to give a bit of a "credit" (eg if we calculate that you need to spend $800, they may approve you around $650). Review your finances (looking specifically at total income on your most recent tax return, or the total of all sources of income if you do not file taxes). We can talk projections at our next call.   2) We also talked about treatment of your cholesterol. It is recommended that all patients with diabetes be on a statin medication to lower your bad (LDL) cholesterol, stabilize your arteries, and reduce your risk of heart attacks or strokes. Many patients tolerate this class of medications just fine. Think about it, and talk more with Dr. Wynetta Emery at your next appointment.    Feel free to call me with any questions in the interim.   Catie Darnelle Maffucci, PharmD [phone]    Visit Information  Goals Addressed            This Visit's Progress     Patient Stated   . PharmD "I want to afford my medications" (pt-stated)       CARE PLAN ENTRY (see longtitudinal plan of care for additional care plan information)  Current Barriers:  . Polypharmacy; complex patient with multiple comorbidities including new onset atrial fibrillation, HTN, T2DM, CKD, chronic pain, HLD  . New onset Afib found by PCP; started on Xarelto and metoprolol and now followed by cardiology Dr. Garen Lah. Reports copay for Xarelto is $40/month, wonders if there is anything cheaper . Most recent  eGFR: 74 o Afib/HTN, some LEE but preserved ED: CHADS2VASc (4- age, female, HTN, DM); metoprolol succinate 50 mg daily, benazepril 40 mg daily, furosemide 40 mg daily; Xarelto 20 mg daily (eGFR right at cut off for dose reduction); reports concerns w/ copay o T2DM: last A1c 6.5%; metformin XR 1000 mg BID o ASCVD risk reduction: previously declined statin therapy; 10 year ASCVD risk 23% o Chronic pain: tramadol 50 mg PRN  Pharmacist Clinical Goal(s):  Marland Kitchen Over the next 90 days, patient will work with PharmD and provider towards optimized medication management  Interventions: . Comprehensive medication review performed; medication list updated in electronic medical record . Discussed afib, including physiologic reason for anticoagulant and beta blocker therapy.  . Reviewed Xarelto patient assistance program criteria. Patient meets income criteria, but does not meet out of pocket spend requirement right now (program requires that patient spend 4% of total yearly household income on copays prior to approval). Discussed that Eliquis program is more attainable (only 3% of total household income required). Mailing written financial information to patient; she will review her household income and we will discuss moving forward. Noted that the program for Eliquis will also accept her husband's copay spend towards the total.  . Patient asked how her lab work looked. We reviewed CKD, but spent a great amount of time talking about cholesterol, as well as ASCVD risk and benefit of statin therapy given DM and risk factors. Explained mechanism of  statins, and that we would choose statins (eg rosuvastatin) less likely to cause muscle symptoms. Patient concerned about adding another life long medication, but notes that she will contemplate this and discuss more w/ Dr. Wynetta Emery at upcoming appointment in April. We discussed moderate intensity option of rosuvastatin 5 mg daily  Patient Self Care Activities:  . Patient  will take medications as prescribed  Initial goal documentation        The patient verbalized understanding of instructions provided today and agreed to receive a mailed copy of patient instruction and/or educational materials. Plan: - Scheduled f/u call 03/21/20  Catie Darnelle Maffucci, PharmD, [place] [phone]

## 2020-01-21 ENCOUNTER — Telehealth: Payer: Medicare PPO

## 2020-02-08 ENCOUNTER — Other Ambulatory Visit: Payer: Self-pay | Admitting: Family Medicine

## 2020-02-08 NOTE — Telephone Encounter (Signed)
Requested Prescriptions  Pending Prescriptions Disp Refills  . XARELTO 20 MG TABS tablet [Pharmacy Med Name: Alveda Reasons 20MG  TABLETS] 90 tablet 2    Sig: TAKE 1 TABLET(20 MG) BY MOUTH DAILY WITH SUPPER     Hematology: Anticoagulants - rivaroxaban Failed - 02/08/2020  3:24 AM      Failed - Cr in normal range and within 360 days    Creatinine, Ser  Date Value Ref Range Status  12/08/2019 1.23 (H) 0.44 - 1.00 mg/dL Final         Failed - HCT in normal range and within 360 days    Hematocrit  Date Value Ref Range Status  12/17/2019 47.2 (H) 34.0 - 46.6 % Final         Passed - ALT in normal range and within 180 days    ALT  Date Value Ref Range Status  11/16/2019 23 0 - 32 IU/L Final         Passed - AST in normal range and within 180 days    AST  Date Value Ref Range Status  11/16/2019 24 0 - 40 IU/L Final         Passed - HGB in normal range and within 360 days    Hemoglobin  Date Value Ref Range Status  12/17/2019 15.8 11.1 - 15.9 g/dL Final         Passed - PLT in normal range and within 360 days    Platelets  Date Value Ref Range Status  12/17/2019 309 150 - 450 x10E3/uL Final         Passed - Valid encounter within last 12 months    Recent Outpatient Visits          1 month ago New onset atrial fibrillation ([place])   [place], [name], DO   2 months ago New onset atrial fibrillation ([place])   [place], [name], DO   2 months ago Type 2 diabetes mellitus with stage 2 chronic kidney disease, without long-term current use of insulin ([place])   [facility] [place], [name], DO   6 months ago Right wrist pain   [facility] [place], [name], DO   9 months ago 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

## 2020-02-14 ENCOUNTER — Ambulatory Visit (INDEPENDENT_AMBULATORY_CARE_PROVIDER_SITE_OTHER): Payer: Medicare PPO | Admitting: Family Medicine

## 2020-02-14 ENCOUNTER — Encounter: Payer: Self-pay | Admitting: Family Medicine

## 2020-02-14 ENCOUNTER — Other Ambulatory Visit: Payer: Self-pay

## 2020-02-14 VITALS — BP 127/77 | HR 78 | Temp 98.7°F | Wt 238.1 lb

## 2020-02-14 DIAGNOSIS — N182 Chronic kidney disease, stage 2 (mild): Secondary | ICD-10-CM

## 2020-02-14 DIAGNOSIS — E1122 Type 2 diabetes mellitus with diabetic chronic kidney disease: Secondary | ICD-10-CM | POA: Diagnosis not present

## 2020-02-14 LAB — BAYER DCA HB A1C WAIVED: HB A1C (BAYER DCA - WAIVED): 6.5 % (ref <7.0)

## 2020-02-14 NOTE — Progress Notes (Signed)
BP 127/77   Pulse 78   Temp 98.7 F (37.1 C)   Wt 238 lb 2 oz (108 kg)   SpO2 99%   BMI 44.26 kg/m    Subjective:    Patient ID: Karen Farley, female    DOB: 05/29/1946, 74 y.o.   MRN: [id number]  HPI: Karen Farley is a 74 y.o. female  Chief Complaint  Patient presents with  . Diabetes   DIABETES Hypoglycemic episodes:no Polydipsia/polyuria: no Visual disturbance: no Chest pain: no Paresthesias: no Glucose Monitoring: yes  Accucheck frequency: Daily Taking Insulin?: no Blood Pressure Monitoring: not checking Retinal Examination: Not up to Date Foot Exam: Up to Date Diabetic Education: Completed Pneumovax: Up to Date Influenza: Up to Date Aspirin: yes  Continues with swelling in her legs. Can't get compression hose that fit properly. Doesn't want to see vascular right now because she's seeing too many doctors. Will let us know if she changes her mind.   Relevant past medical, surgical, family and social history reviewed and updated as indicated. Interim medical history since our last visit reviewed. Allergies and medications reviewed and updated.  Review of Systems  Constitutional: Negative.   HENT: Negative.   Respiratory: Negative.   Cardiovascular: Positive for leg swelling. Negative for chest pain and palpitations.  Gastrointestinal: Negative.   Musculoskeletal: Negative.   Psychiatric/Behavioral: Negative.     Per HPI unless specifically indicated above     Objective:    BP 127/77   Pulse 78   Temp 98.7 F (37.1 C)   Wt 238 lb 2 oz (108 kg)   SpO2 99%   BMI 44.26 kg/m   Wt Readings from Last 3 Encounters:  02/14/20 238 lb 2 oz (108 kg)  12/10/19 231 lb (104.8 kg)  11/26/19 243 lb (110.2 kg)    Physical Exam Vitals and nursing note reviewed.  Constitutional:      General: She is not in acute distress.    Appearance: Normal appearance. She is obese. She is not ill-appearing, toxic-appearing or diaphoretic.  HENT:     Head:  Normocephalic and atraumatic.     Right Ear: External ear normal.     Left Ear: External ear normal.     Nose: Nose normal.     Mouth/Throat:     Mouth: Mucous membranes are moist.     Pharynx: Oropharynx is clear.  Eyes:     General: No scleral icterus.       Right eye: No discharge.        Left eye: No discharge.     Extraocular Movements: Extraocular movements intact.     Conjunctiva/sclera: Conjunctivae normal.     Pupils: Pupils are equal, round, and reactive to light.  Cardiovascular:     Rate and Rhythm: Normal rate and regular rhythm.     Pulses: Normal pulses.     Heart sounds: Normal heart sounds. No murmur. No friction rub. No gallop.   Pulmonary:     Effort: Pulmonary effort is normal. No respiratory distress.     Breath sounds: Normal breath sounds. No stridor. No wheezing, rhonchi or rales.  Chest:     Chest wall: No tenderness.  Musculoskeletal:        General: Normal range of motion.     Cervical back: Normal range of motion and neck supple.     Right lower leg: Edema present.     Left lower leg: Edema present.  Skin:    General: Skin is  warm and dry.     Capillary Refill: Capillary refill takes less than 2 seconds.     Coloration: Skin is not jaundiced or pale.     Findings: No bruising, erythema, lesion or rash.  Neurological:     General: No focal deficit present.     Mental Status: She is alert and oriented to person, place, and time. Mental status is at baseline.  Psychiatric:        Mood and Affect: Mood normal.        Behavior: Behavior normal.        Thought Content: Thought content normal.        Judgment: Judgment normal.     Results for orders placed or performed in visit on 12/17/19  CBC with Differential/Platelet  Result Value Ref Range   WBC 7.0 3.4 - 10.8 x10E3/uL   RBC 5.50 (H) 3.77 - 5.28 x10E6/uL   Hemoglobin 15.8 11.1 - 15.9 g/dL   Hematocrit 47.2 (H) 34.0 - 46.6 %   MCV 86 79 - 97 fL   MCH 28.7 26.6 - 33.0 pg   MCHC 33.5 31.5 -  35.7 g/dL   RDW 13.6 11.7 - 15.4 %   Platelets 309 150 - 450 x10E3/uL   Neutrophils 64 Not Estab. %   Lymphs 18 Not Estab. %   Monocytes 10 Not Estab. %   Eos 7 Not Estab. %   Basos 1 Not Estab. %   Neutrophils Absolute 4.5 1.4 - 7.0 x10E3/uL   Lymphocytes Absolute 1.3 0.7 - 3.1 x10E3/uL   Monocytes Absolute 0.7 0.1 - 0.9 x10E3/uL   EOS (ABSOLUTE) 0.5 (H) 0.0 - 0.4 x10E3/uL   Basophils Absolute 0.1 0.0 - 0.2 x10E3/uL   Immature Granulocytes 0 Not Estab. 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 Continue diet and exercise. Continue current regimen. Call with any concerns. Recheck 3 months.       Relevant Orders   Bayer DCA Hb A1c Waived       Follow up plan: Return in about 3 months (around 05/15/2020) for physical/wellness.

## 2020-02-14 NOTE — Assessment & Plan Note (Addendum)
Under good control with A1c of 6.5. Continue diet and exercise. Continue current regimen. Call with any concerns. Recheck 3 months.

## 2020-02-29 DIAGNOSIS — I872 Venous insufficiency (chronic) (peripheral): Secondary | ICD-10-CM | POA: Diagnosis not present

## 2020-02-29 DIAGNOSIS — L718 Other rosacea: Secondary | ICD-10-CM | POA: Diagnosis not present

## 2020-03-10 ENCOUNTER — Other Ambulatory Visit: Payer: Self-pay

## 2020-03-10 ENCOUNTER — Encounter: Payer: Self-pay | Admitting: Cardiology

## 2020-03-10 ENCOUNTER — Ambulatory Visit (INDEPENDENT_AMBULATORY_CARE_PROVIDER_SITE_OTHER): Payer: Medicare PPO | Admitting: Cardiology

## 2020-03-10 VITALS — BP 120/90 | HR 89 | Ht 62.0 in | Wt 233.0 lb

## 2020-03-10 DIAGNOSIS — R600 Localized edema: Secondary | ICD-10-CM | POA: Diagnosis not present

## 2020-03-10 DIAGNOSIS — I5189 Other ill-defined heart diseases: Secondary | ICD-10-CM | POA: Diagnosis not present

## 2020-03-10 DIAGNOSIS — I4819 Other persistent atrial fibrillation: Secondary | ICD-10-CM

## 2020-03-10 DIAGNOSIS — I10 Essential (primary) hypertension: Secondary | ICD-10-CM

## 2020-03-10 MED ORDER — TORSEMIDE 20 MG PO TABS: 40.0000 mg | ORAL_TABLET | Freq: Every day | ORAL | 1 refills | Status: DC

## 2020-03-10 NOTE — Progress Notes (Signed)
Cardiology Office Note:    Date:  03/10/2020   ID:  Karen Farley, DOB 27-Jan-1946, MRN [id number]  PCP:  Valerie Roys, DO  Cardiologist:  Kate Sable, MD  Electrophysiologist:  None   Referring MD: Valerie Roys, DO   Chief Complaint  Patient presents with  . Other    3 monht follow up. meds reviewed verballed with patient.     History of Present Illness:    Karen Farley is a 74 y.o. female with a hx of CKD stage III, hypertension, persistent A. fib, hyperlipidemia, who presents for follow-up.  She is being seen due to atrial fibrillation and lower extremity edema.  She takes Toprol and Xarelto for atrial fibrillation.  Also has diastolic dysfunction grade 2.  She had lower extremity edema which was being managed with Lasix with much improvement but has stabilized.  He does not get that much improvement when taking Lasix currently.  She states having lower extremity edema.  Her creatinine function recently monitored by primary care physician has stayed the same.  She is waiting on seeing a nephrologist as she has been to too many physicians offices over the last several weeks.  Historical notes Patient saw PCP on January 26 and EKG noted to be in atrial fibrillation.  She was started on Toprol-XL and Xarelto. She states having a long history of lower extremity swelling for years.  Denies chest pain but notes some shortness of breath with exertion.  Endorses palpitations and irregular heart rate.  Lasix 40 mg daily was started for edema.  TTEcho showed normal systolic function with ejection fraction of EF 60 to 65%, moderately dilated LA. states her edema has improved with Lasix.     Past Medical History:  Diagnosis Date  . 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 DDD (degenerative disc disease), lumbar   . Degenerative disc disease, lumbar    bulging and dengerated  . Diabetes mellitus without complication ([place])   . Hyperlipidemia   .  Hypertension   . Osteoarthritis of both knees   . Osteopenia     Past Surgical History:  Procedure Laterality Date  . CHOLECYSTECTOMY    . DILATION AND CURETTAGE OF UTERUS    . TEAR DUCT PROBING WITH STRABISMUS REPAIR Right   . TONSILLECTOMY      Current Medications: Current Meds  Medication Sig  . benazepril (LOTENSIN) 20 MG tablet Take 1 tablet (20 mg total) by mouth daily.  Regino Schultze Bandages & Supports (MEDICAL COMPRESSION STOCKINGS) MISC by Does not apply route.  . latanoprost (XALATAN) 0.005 % ophthalmic solution 1 drop at bedtime.  . metFORMIN (GLUCOPHAGE-XR) 500 MG 24 hr tablet Take 2 tablets (1,000 mg total) by mouth 2 (two) times daily.  . metoprolol succinate (TOPROL-XL) 50 MG 24 hr tablet Take 1 tablet (50 mg total) by mouth daily. Take with or immediately following a meal.  . Multiple Vitamin (MULTIVITAMIN WITH MINERALS) TABS tablet Take 1 tablet by mouth daily.  Glory Rosebush ULTRA test strip USE TO TEST EVERY DAY AS DIRECTED  . traMADol (ULTRAM) 50 MG tablet Take 1-2 tablets (50-100 mg total) by mouth every 6 (six) hours as needed.  . Vitamin D, Cholecalciferol, 50 MCG (2000 UT) CAPS Take 1 tablet by mouth.  Alveda Reasons 20 MG TABS tablet TAKE 1 TABLET(20 MG) BY MOUTH DAILY WITH SUPPER  . [DISCONTINUED] furosemide (LASIX) 40 MG tablet Take 1 tablet (40 mg total) by mouth  daily.     Allergies:   Patient has no known allergies.   Social History   Socioeconomic History  . Marital status: Married    Spouse name: Not on file  . Number of children: Not on file  . Years of education: Not on file  . Highest education level: Not on file  Occupational History  . Not on file  Tobacco Use  . Smoking status: Never Smoker  . Smokeless tobacco: Never Used  Substance and Sexual Activity  . Alcohol use: Yes    Alcohol/week: 0.0 standard drinks    Comment: on rare occasion  . Drug use: No  . Sexual activity: Not Currently  Other Topics Concern  . Not on file  Social History  Narrative  . Not on file   Social Determinants of Health   Financial Resource Strain:   . Difficulty of Paying Living Expenses:   Food Insecurity:   . Worried About Charity fundraiser in the Last Year:   . Arboriculturist in the Last Year:   Transportation Needs:   . Film/video editor (Medical):   Marland Kitchen Lack of Transportation (Non-Medical):   Physical Activity:   . Days of Exercise per Week:   . Minutes of Exercise per Session:   Stress:   . Feeling of Stress :   Social Connections:   . Frequency of Communication with Friends and Family:   . Frequency of Social Gatherings with Friends and Family:   . Attends Religious Services:   . Active Member of Clubs or Organizations:   . Attends Archivist Meetings:   Marland Kitchen Marital Status:      Family History: The patient's family history includes Breast cancer in her maternal grandmother and mother; Cancer in her brother; Heart disease in her brother and mother; Heart disease (age of onset: 63) in her father; Osteoporosis in her mother; Parkinson's disease in her brother.  ROS:   Please see the history of present illness.     All other systems reviewed and are negative.  EKGs/Labs/Other Studies Reviewed:    The following studies were reviewed today:   EKG:  EKG is  ordered today.  The ekg ordered today demonstrates atrial fibrillation, heart rate 89.  Recent Labs: 04/27/2019: TSH 2.970 11/16/2019: ALT 23 12/08/2019: BUN 30; Creatinine, Ser 1.23; Potassium 4.2; Sodium 139 12/17/2019: Hemoglobin 15.8; Platelets 309  Recent Lipid Panel    Component Value Date/Time   CHOL 186 11/16/2019 1419   CHOL 211 (H) 06/28/2016 1317   TRIG 152 (H) 11/16/2019 1419   TRIG 204 (H) 06/28/2016 1317   HDL 51 11/16/2019 1419   VLDL 41 (H) 06/28/2016 1317   LDLCALC 108 (H) 11/16/2019 1419    Physical Exam:    VS:  BP 120/90 (BP Location: Right Arm, Patient Position: Sitting, Cuff Size: Normal)   Pulse 89   Ht 5\' 2"  (1.575 m)   Wt 233  lb (105.7 kg)   SpO2 94%   BMI 42.62 kg/m     Wt Readings from Last 3 Encounters:  03/10/20 233 lb (105.7 kg)  02/14/20 238 lb 2 oz (108 kg)  12/10/19 231 lb (104.8 kg)     GEN:  Well nourished, well developed in no acute distress HEENT: Normal NECK: No JVD; No carotid bruits LYMPHATICS: No lymphadenopathy CARDIAC: Irregular irregular, no murmurs, rubs, gallops RESPIRATORY:  Clear to auscultation without rales, wheezing or rhonchi  ABDOMEN: Soft, non-tender, non-distended MUSCULOSKELETAL: 2+ edema; No deformity  SKIN: Warm and dry NEUROLOGIC:  Alert and oriented x 3 PSYCHIATRIC:  Normal affect   ASSESSMENT:    1. Persistent atrial fibrillation ([place])   2. Diastolic dysfunction   3. Edema of both lower extremities   4. Essential hypertension   5. Morbid obesity ([place])    PLAN:      1. Patient with persistent atrial fibrillation.  CHA2DS2-VASc of 4.  Continue Toprol-XL to 50 mg daily, continue Xarelto 20 mg daily.  Echo with normal EF, moderately dilated left atrium. 2. Long history of lower extremity edema.  Echo with normal ejection fraction, echo reviewed again shows grade 2 diastolic dysfunction.  Morbid obesity and hypertension likely contributing.  Continue Toprol-XL, stop Lasix, start torsemide. 3. Edema noted in lower extremities likely due to diastolic dysfunction.  Abdominal distention likely preventing adequate absorption of Lasix.  Stop Lasix, start torsemide 40 mg daily.  Get BMP to evaluate renal function in a week.  Follow-up with me in about 10 days. 4. Blood pressure okay.  Continue Benicar, torsemide, Toprol-XL , will consider decreasing Benicar blood pressure decreases.  Patient advised to get daily BP checks at home and keep a BP log. 5. Patient is morbidly obese, weight loss low-calorie diet advised.  This is likely contributing to diastolic dysfunction.  Follow-up 10days  Total encounter time 45 minutes  Greater than 50% was spent in counseling and  coordination of care with the patient Time educating patient on causes of diastolic dysfunction, etiology for edema, medication management, side effects.  This note was generated in part or whole with voice recognition software. Voice recognition is usually quite accurate but there are transcription errors that can and very often do occur. I apologize for any typographical errors that were not detected and corrected.  Medication Adjustments/Labs and Tests Ordered: Current medicines are reviewed at length with the patient today.  Concerns regarding medicines are outlined above.  Orders Placed This Encounter  Procedures  . Basic Metabolic Panel (BMET)  . EKG 12-Lead   Meds ordered this encounter  Medications  . torsemide (DEMADEX) 20 MG tablet    Sig: Take 2 tablets (40 mg total) by mouth daily.    Dispense:  60 tablet    Refill:  1    Patient Instructions  Medication Instructions:  Your physician has recommended you make the following change in your medication:  1.  STOP taking your Lasix (Furosemide). 2.  START taking Torsemide (20mg ) two tablets daily.  *If you need a refill on your cardiac medications before your next appointment, please call your pharmacy*   Lab Work: Your physician recommends that you return for lab work in:  1 week (Friday 03/17/20)  Please come to the medical mall entrance at [facility] desk on the right to check in past the screening table. Lab hours: M-F 0730-5:30 No appointment needed.   If you have labs (blood work) drawn today and your tests are completely normal, you will receive your results only by: Marland Kitchen MyChart Message (if you have MyChart) OR . A paper copy in the mail If you have any lab test that is abnormal or we need to change your treatment, we will call you to review the results.   Testing/Procedures: None Ordered   Follow-Up: At [facility], you and your health needs are our priority.  As part of our continuing mission to  provide you with exceptional heart care, we have created designated Provider Care Teams.  These Care Teams include your primary Cardiologist (  physician) and Advanced Practice Providers (APPs -  Physician Assistants and Nurse Practitioners) who all work together to provide you with the care you need, when you need it.  We recommend signing up for the patient portal called "MyChart".  Sign up information is provided on this After Visit Summary.  MyChart is used to connect with patients for Virtual Visits (Telemedicine).  Patients are able to view lab/test results, encounter notes, upcoming appointments, etc.  Non-urgent messages can be sent to your provider as well.   To learn more about what you can do with MyChart, go to [email or web address].    Your next appointment:   10 day(s)  The format for your next appointment:   In Person  Provider:   Kate Sable, MD ONLY   Other Instructions  Torsemide Oral Tablets What is this medicine? TORSEMIDE (TORE se mide) is a diuretic. It helps you make more urine and lose salt and water from your body. It treats swelling from heart, kidney, or liver disease. It also treats high blood pressure. This medicine may be used for other purposes; ask your health care provider or pharmacist if you have questions. COMMON BRAND NAME(S): Demadex What should I tell my health care provider before I take this medicine? They need to know if you have any of these conditions:  high or low levels of electrolytes, like magnesium, potassium, and sodium, in your blood  diabetes  gout  kidney disease  liver disease  an unusual or allergic reaction to torsemide, povidone, other medicines, foods, dyes, or preservatives  pregnant or trying to get pregnant  breast-feeding How should I use this medicine? Take this drug by mouth with water. Take it as directed on the prescription label at the same time every day. Keep taking it unless your health care provider  tells you to stop. Talk to your health care provider about the use of this drug in children. Special care may be needed. Overdosage: If you think you have taken too much of this medicine contact a poison control center or emergency room at once. NOTE: This medicine is only for you. Do not share this medicine with others. What if I miss a dose? If you miss a dose, take it as soon as you can. If it is almost time for your next dose, take only that dose. Do not take double or extra doses. What may interact with this medicine?  alcohol  aspirin and aspirin-like medicines  celecoxib  certain medicines for blood pressure, heart disease, irregular heartbeat  certain medicines for cholesterol like cholestyramine  certain medicines for diabetes  cisplatin  cyclosporine  ephedra  ginseng  lithium  medicines for infection like acyclovir, adefovir, amphotericin B, bacitracin, cidofovir, foscarnet, ganciclovir, gentamicin, pentamidine, vancomycin  medicines that relax muscles for surgery  NSAIDs, medicines for pain and inflammation, like ibuprofen or naproxen  other diuretics  pamidronate  probenecid  rifampin  steroid medicines like prednisone or cortisone  warfarin  zoledronic acid This list may not describe all possible interactions. Give your health care provider a list of all the medicines, herbs, non-prescription drugs, or dietary supplements you use. Also tell them if you smoke, drink alcohol, or use illegal drugs. Some items may interact with your medicine. What should I watch for while using this medicine? Visit your health care provider for regular checks on your progress. Tell your health care provider if your symptoms do not start to get better or if they get worse. Check your blood pressure regularly.  Ask your health care provider what your blood pressure should be. Also, find out when you should contact him or her. You may need blood work done while you are taking  this drug. Do not treat yourself for coughs, colds, or pain while using this drug without asking your health care provider for advice. Some drugs may increase your blood pressure. This drug may increase blood sugar. Ask your health care provider if changes in diet or drugs are needed if you have diabetes. You may need to be on a special diet while you are taking this drug. Ask your health care provider. Also, find out how many glasses of fluids you need to drink each day. Check with your health care provider if you have severe diarrhea, nausea, and vomiting, or if you sweat a lot. The loss of too much body fluid may make it dangerous for you to take this drug. You may get drowsy or dizzy. Do not drive, use machinery, or do anything that needs mental alertness until you know how this drug affects you. Do not stand or sit up quickly, especially if you are an older patient. This reduces the risk of dizzy or fainting spells. Alcohol may interfere with the effects of this drug. Avoid alcoholic drinks. What side effects may I notice from receiving this medicine? Side effects that you should report to your doctor or health care professional as soon as possible:  allergic reactions (skin rash, itching or hives; swelling of the face, lips, or tongue)  decreased hearing, ringing in the ears  electrolyte imbalance (increased thirst; loss of appetite; severe diarrhea; unusual sweating; vomiting)  kidney injury (trouble passing urine or change in the amount of urine)  low potassium (trouble breathing, chest pain; dizziness; fast, irregular heartbeat; feeling faints or lightheaded, falls; muscle cramps or pain) Side effects that usually do not require medical attention (report to your doctor or health care professional if they continue or are bothersome):  passing large amounts of urine  stomach pain This list may not describe all possible side effects. Call your doctor for medical advice about side  effects. You may report side effects to FDA at 1-800-FDA-1088. Where should I keep my medicine? Keep out of the reach of children and pets. Store at room temperature between 15 and 30 degrees C (59 and 86 degrees F). Do not freeze. Throw away any unused drug after the expiration date. NOTE: This sheet is a summary. It may not cover all possible information. If you have questions about this medicine, talk to your doctor, pharmacist, or health care provider.  2020 Elsevier/Gold Standard (2019-06-24 19:52:40)      Signed, Kate Sable, MD  03/10/2020 5:02 PM    [facility]

## 2020-03-10 NOTE — Patient Instructions (Signed)
Medication Instructions:  Your physician has recommended you make the following change in your medication:  1.  STOP taking your Lasix (Furosemide). 2.  START taking Torsemide (20mg ) two tablets daily.  *If you need a refill on your cardiac medications before your next appointment, please call your pharmacy*   Lab Work: Your physician recommends that you return for lab work in:  1 week (Friday 03/17/20)  Please come to the medical mall entrance at [facility], [place] desk on the right to check in past the screening table. Lab hours: M-F 0730-5:30 No appointment needed.   If you have labs (blood work) drawn today and your tests are completely normal, you will receive your results only by: Marland Kitchen MyChart Message (if you have MyChart) OR . A paper copy in the mail If you have any lab test that is abnormal or we need to change your treatment, we will call you to review the results.   Testing/Procedures: None Ordered   Follow-Up: At [facility], you and your health needs are our priority.  As part of our continuing mission to provide you with exceptional heart care, we have created designated Provider Care Teams.  These Care Teams include your primary Cardiologist (physician) and Advanced Practice Providers (APPs -  Physician Assistants and Nurse Practitioners) who all work together to provide you with the care you need, when you need it.  We recommend signing up for the patient portal called "MyChart".  Sign up information is provided on this After Visit Summary.  MyChart is used to connect with patients for Virtual Visits (Telemedicine).  Patients are able to view lab/test results, encounter notes, upcoming appointments, etc.  Non-urgent messages can be sent to your provider as well.   To learn more about what you can do with MyChart, go to [email or web address].    Your next appointment:   10 day(s)  The format for your next appointment:   In Person  Provider:   Kate Sable, MD  ONLY   Other Instructions  Torsemide Oral Tablets What is this medicine? TORSEMIDE (TORE se mide) is a diuretic. It helps you make more urine and lose salt and water from your body. It treats swelling from heart, kidney, or liver disease. It also treats high blood pressure. This medicine may be used for other purposes; ask your health care provider or pharmacist if you have questions. COMMON BRAND NAME(S): Demadex What should I tell my health care provider before I take this medicine? They need to know if you have any of these conditions:  high or low levels of electrolytes, like magnesium, potassium, and sodium, in your blood  diabetes  gout  kidney disease  liver disease  an unusual or allergic reaction to torsemide, povidone, other medicines, foods, dyes, or preservatives  pregnant or trying to get pregnant  breast-feeding How should I use this medicine? Take this drug by mouth with water. Take it as directed on the prescription label at the same time every day. Keep taking it unless your health care provider tells you to stop. Talk to your health care provider about the use of this drug in children. Special care may be needed. Overdosage: If you think you have taken too much of this medicine contact a poison control center or emergency room at once. NOTE: This medicine is only for you. Do not share this medicine with others. What if I miss a dose? If you miss a dose, take it as soon as you can. If it is almost  time for your next dose, take only that dose. Do not take double or extra doses. What may interact with this medicine?  alcohol  aspirin and aspirin-like medicines  celecoxib  certain medicines for blood pressure, heart disease, irregular heartbeat  certain medicines for cholesterol like cholestyramine  certain medicines for diabetes  cisplatin  cyclosporine  ephedra  ginseng  lithium  medicines for infection like acyclovir, adefovir, amphotericin  B, bacitracin, cidofovir, foscarnet, ganciclovir, gentamicin, pentamidine, vancomycin  medicines that relax muscles for surgery  NSAIDs, medicines for pain and inflammation, like ibuprofen or naproxen  other diuretics  pamidronate  probenecid  rifampin  steroid medicines like prednisone or cortisone  warfarin  zoledronic acid This list may not describe all possible interactions. Give your health care provider a list of all the medicines, herbs, non-prescription drugs, or dietary supplements you use. Also tell them if you smoke, drink alcohol, or use illegal drugs. Some items may interact with your medicine. What should I watch for while using this medicine? Visit your health care provider for regular checks on your progress. Tell your health care provider if your symptoms do not start to get better or if they get worse. Check your blood pressure regularly. Ask your health care provider what your blood pressure should be. Also, find out when you should contact him or her. You may need blood work done while you are taking this drug. Do not treat yourself for coughs, colds, or pain while using this drug without asking your health care provider for advice. Some drugs may increase your blood pressure. This drug may increase blood sugar. Ask your health care provider if changes in diet or drugs are needed if you have diabetes. You may need to be on a special diet while you are taking this drug. Ask your health care provider. Also, find out how many glasses of fluids you need to drink each day. Check with your health care provider if you have severe diarrhea, nausea, and vomiting, or if you sweat a lot. The loss of too much body fluid may make it dangerous for you to take this drug. You may get drowsy or dizzy. Do not drive, use machinery, or do anything that needs mental alertness until you know how this drug affects you. Do not stand or sit up quickly, especially if you are an older patient.  This reduces the risk of dizzy or fainting spells. Alcohol may interfere with the effects of this drug. Avoid alcoholic drinks. What side effects may I notice from receiving this medicine? Side effects that you should report to your doctor or health care professional as soon as possible:  allergic reactions (skin rash, itching or hives; swelling of the face, lips, or tongue)  decreased hearing, ringing in the ears  electrolyte imbalance (increased thirst; loss of appetite; severe diarrhea; unusual sweating; vomiting)  kidney injury (trouble passing urine or change in the amount of urine)  low potassium (trouble breathing, chest pain; dizziness; fast, irregular heartbeat; feeling faints or lightheaded, falls; muscle cramps or pain) Side effects that usually do not require medical attention (report to your doctor or health care professional if they continue or are bothersome):  passing large amounts of urine  stomach pain This list may not describe all possible side effects. Call your doctor for medical advice about side effects. You may report side effects to FDA at 1-800-FDA-1088. Where should I keep my medicine? Keep out of the reach of children and pets. Store at room temperature between  15 and 30 degrees C (59 and 86 degrees F). Do not freeze. Throw away any unused drug after the expiration date. NOTE: This sheet is a summary. It may not cover all possible information. If you have questions about this medicine, talk to your doctor, pharmacist, or health care provider.  2020 Elsevier/Gold Standard (2019-06-24 19:52:40)

## 2020-03-17 ENCOUNTER — Other Ambulatory Visit: Payer: Medicare PPO

## 2020-03-17 ENCOUNTER — Telehealth: Payer: Self-pay | Admitting: *Deleted

## 2020-03-17 ENCOUNTER — Other Ambulatory Visit
Admission: RE | Admit: 2020-03-17 | Discharge: 2020-03-17 | Disposition: A | Discharge status: Home / Self Care | Payer: Medicare PPO | Attending: Cardiology | Admitting: Cardiology

## 2020-03-17 ENCOUNTER — Other Ambulatory Visit
Admission: RE | Admit: 2020-03-17 | Discharge: 2020-03-17 | Disposition: A | Discharge status: Home / Self Care | Payer: Medicare PPO | Source: Ambulatory Visit | Attending: Cardiology | Admitting: Cardiology

## 2020-03-17 DIAGNOSIS — I4819 Other persistent atrial fibrillation: Secondary | ICD-10-CM

## 2020-03-17 DIAGNOSIS — E1122 Type 2 diabetes mellitus with diabetic chronic kidney disease: Secondary | ICD-10-CM

## 2020-03-17 LAB — BASIC METABOLIC PANEL
Anion gap: 16 — ABNORMAL HIGH (ref 5–15)
BUN: 63 mg/dL — ABNORMAL HIGH (ref 8–23)
CO2: 26 mmol/L (ref 22–32)
Calcium: 10.6 mg/dL — ABNORMAL HIGH (ref 8.9–10.3)
Chloride: 92 mmol/L — ABNORMAL LOW (ref 98–111)
Creatinine, Ser: 3.08 mg/dL — ABNORMAL HIGH (ref 0.44–1.00)
GFR calc Af Amer: 17 mL/min — ABNORMAL LOW (ref >60)
GFR calc non Af Amer: 14 mL/min — ABNORMAL LOW (ref >60)
Glucose, Bld: 171 mg/dL — ABNORMAL HIGH (ref 70–99)
Potassium: 4.5 mmol/L (ref 3.5–5.1)
Sodium: 134 mmol/L — ABNORMAL LOW (ref 135–145)

## 2020-03-17 NOTE — Telephone Encounter (Signed)
Spoke with patient and she has not seen nephrology but did mention that it was discussed at last visit. Provided her number for [employer]. Reviewed verbal orders received by provider to hold her torsemide and have BMET done on Tuesday June 1st then keep appointment next week with him here in the office. She verbalized understanding of all instructions with no further questions at this time.

## 2020-03-21 ENCOUNTER — Telehealth: Payer: Self-pay

## 2020-03-21 LAB — BASIC METABOLIC PANEL
Anion gap: 13 (ref 5–15)
BUN: 65 mg/dL — ABNORMAL HIGH (ref 8–23)
CO2: 26 mmol/L (ref 22–32)
Calcium: 10 mg/dL (ref 8.9–10.3)
Chloride: 97 mmol/L — ABNORMAL LOW (ref 98–111)
Creatinine, Ser: 3.11 mg/dL — ABNORMAL HIGH (ref 0.44–1.00)
GFR calc Af Amer: 16 mL/min — ABNORMAL LOW (ref >60)
GFR calc non Af Amer: 14 mL/min — ABNORMAL LOW (ref >60)
Glucose, Bld: 165 mg/dL — ABNORMAL HIGH (ref 70–99)
Potassium: 4.6 mmol/L (ref 3.5–5.1)
Sodium: 136 mmol/L (ref 135–145)

## 2020-03-22 NOTE — Addendum Note (Signed)
Addended by: Verlon Au on: 03/22/2020 03:22 PM   Modules accepted: Orders

## 2020-03-22 NOTE — Telephone Encounter (Signed)
Patient calling States that she cannot make an appointment with [facility] until they receive a referral from [place] Please refer

## 2020-03-22 NOTE — Telephone Encounter (Signed)
Order placed for fax to [facility] [place] [facility]. Faxed with pt information to number on file.   Call to patient to make her aware that I would fax and apologized for the delay in care.

## 2020-03-24 ENCOUNTER — Other Ambulatory Visit: Payer: Self-pay

## 2020-03-24 ENCOUNTER — Ambulatory Visit (INDEPENDENT_AMBULATORY_CARE_PROVIDER_SITE_OTHER): Payer: Medicare PPO | Admitting: Cardiology

## 2020-03-24 ENCOUNTER — Encounter: Payer: Self-pay | Admitting: Cardiology

## 2020-03-24 VITALS — BP 110/84 | HR 100 | Ht 62.0 in | Wt 230.1 lb

## 2020-03-24 DIAGNOSIS — I10 Essential (primary) hypertension: Secondary | ICD-10-CM | POA: Diagnosis not present

## 2020-03-24 DIAGNOSIS — I5189 Other ill-defined heart diseases: Secondary | ICD-10-CM | POA: Diagnosis not present

## 2020-03-24 DIAGNOSIS — I4819 Other persistent atrial fibrillation: Secondary | ICD-10-CM | POA: Diagnosis not present

## 2020-03-24 NOTE — Patient Instructions (Signed)
Medication Instructions:   Your physician recommends that you continue on your current medications as directed. Please refer to the Current Medication list given to you today.  *If you need a refill on your cardiac medications before your next appointment, please call your pharmacy*   Lab Work: None Ordered If you have labs (blood work) drawn today and your tests are completely normal, you will receive your results only by: Marland Kitchen MyChart Message (if you have MyChart) OR . A paper copy in the mail If you have any lab test that is abnormal or we need to change your treatment, we will call you to review the results.   Testing/Procedures: None Ordered   Follow-Up:  At [facility], you and your health needs are our priority.  As part of our continuing mission to provide you with exceptional heart care, we have created designated Provider Care Teams.  These Care Teams include your primary Cardiologist (physician) and Advanced Practice Providers (APPs -  Physician Assistants and Nurse Practitioners) who all work together to provide you with the care you need, when you need it.  We recommend signing up for the patient portal called "MyChart".  Sign up information is provided on this After Visit Summary.  MyChart is used to connect with patients for Virtual Visits (Telemedicine).  Patients are able to view lab/test results, encounter notes, upcoming appointments, etc.  Non-urgent messages can be sent to your provider as well.   To learn more about what you can do with MyChart, go to [email or web address].    Your next appointment:   3 month(s), After your Nephrology appointment  The format for your next appointment:   In Person  Provider:   Kate Sable, MD   Other Instructions N/A

## 2020-03-24 NOTE — Progress Notes (Signed)
Cardiology Office Note:    Date:  03/24/2020   ID:  Karen Farley, DOB 1946/01/01, MRN [id number]  PCP:  Valerie Roys, DO  Cardiologist:  Kate Sable, MD  Electrophysiologist:  None   Referring MD: Valerie Roys, DO   Chief Complaint  Patient presents with  . OTHER    10 day f/u. Meds reviewed verbally with pt.    History of Present Illness:    Karen Farley is a 74 y.o. female with a hx of CKD stage III, heart failure preserved ejection fraction, hypertension, persistent A. fib, hyperlipidemia, who presents for follow-up.   She is being seen due to atrial fibrillation and lower extremity edema.  Lower extremity edema was originally managed with Lasix but switch to Toprol-XL.  Last renal function showed increase in creatinine from 1-3.  Torsemide was held with creatinine obtained 3 days ago showing stable function.  Referral to nephrology was placed.  Patient plans to call nephrology office today to schedule appointment.  She states the swelling in her legs have improved somewhat.  Historical notes Patient saw PCP on January 26 and EKG noted to be in atrial fibrillation.  She was started on Toprol-XL and Xarelto. She states having a long history of lower extremity swelling for years.  Denies chest pain but notes some shortness of breath with exertion.  Endorses palpitations and irregular heart rate.  Marland Kitchen  TTEcho showed normal systolic function with ejection fraction of EF 60 to 65%, moderately dilated LA. states her edema has improved with Lasix.     Past Medical History:  Diagnosis Date  . 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 DDD (degenerative disc disease), lumbar   . Degenerative disc disease, lumbar    bulging and dengerated  . Diabetes mellitus without complication ([place])   . Hyperlipidemia   . Hypertension   . Osteoarthritis of both knees   . Osteopenia     Past Surgical History:  Procedure Laterality Date  . CHOLECYSTECTOMY    .  DILATION AND CURETTAGE OF UTERUS    . TEAR DUCT PROBING WITH STRABISMUS REPAIR Right   . TONSILLECTOMY      Current Medications: Current Meds  Medication Sig  . benazepril (LOTENSIN) 20 MG tablet Take 1 tablet (20 mg total) by mouth daily.  Regino Schultze Bandages & Supports (MEDICAL COMPRESSION STOCKINGS) MISC by Does not apply route.  . latanoprost (XALATAN) 0.005 % ophthalmic solution 1 drop at bedtime.  . metFORMIN (GLUCOPHAGE-XR) 500 MG 24 hr tablet Take 2 tablets (1,000 mg total) by mouth 2 (two) times daily.  . metoprolol succinate (TOPROL-XL) 50 MG 24 hr tablet Take 1 tablet (50 mg total) by mouth daily. Take with or immediately following a meal.  . Multiple Vitamin (MULTIVITAMIN WITH MINERALS) TABS tablet Take 1 tablet by mouth daily.  Glory Rosebush ULTRA test strip USE TO TEST EVERY DAY AS DIRECTED  . traMADol (ULTRAM) 50 MG tablet Take 1-2 tablets (50-100 mg total) by mouth every 6 (six) hours as needed.  . Vitamin D, Cholecalciferol, 50 MCG (2000 UT) CAPS Take 1 tablet by mouth.  Alveda Reasons 20 MG TABS tablet TAKE 1 TABLET(20 MG) BY MOUTH DAILY WITH SUPPER     Allergies:   Patient has no known allergies.   Social History   Socioeconomic History  . Marital status: Married    Spouse name: Not on file  . Number of children: Not on file  . Years  of education: Not on file  . Highest education level: Not on file  Occupational History  . Not on file  Tobacco Use  . Smoking status: Never Smoker  . Smokeless tobacco: Never Used  Substance and Sexual Activity  . Alcohol use: Yes    Alcohol/week: 0.0 standard drinks    Comment: on rare occasion  . Drug use: No  . Sexual activity: Not Currently  Other Topics Concern  . Not on file  Social History Narrative  . Not on file   Social Determinants of Health   Financial Resource Strain:   . Difficulty of Paying Living Expenses:   Food Insecurity:   . Worried About Charity fundraiser in the Last Year:   . Arboriculturist in the  Last Year:   Transportation Needs:   . Film/video editor (Medical):   Marland Kitchen Lack of Transportation (Non-Medical):   Physical Activity:   . Days of Exercise per Week:   . Minutes of Exercise per Session:   Stress:   . Feeling of Stress :   Social Connections:   . Frequency of Communication with Friends and Family:   . Frequency of Social Gatherings with Friends and Family:   . Attends Religious Services:   . Active Member of Clubs or Organizations:   . Attends Archivist Meetings:   Marland Kitchen Marital Status:      Family History: The patient's family history includes Breast cancer in her maternal grandmother and mother; Cancer in her brother; Heart disease in her brother and mother; Heart disease (age of onset: 67) in her father; Osteoporosis in her mother; Parkinson's disease in her brother.  ROS:   Please see the history of present illness.     All other systems reviewed and are negative.  EKGs/Labs/Other Studies Reviewed:    The following studies were reviewed today:   EKG:  EKG is  ordered today.  The ekg ordered today demonstrates atrial fibrillation, heart rate 100.  Recent Labs: 04/27/2019: TSH 2.970 11/16/2019: ALT 23 12/17/2019: Hemoglobin 15.8; Platelets 309 03/21/2020: BUN 65; Creatinine, Ser 3.11; Potassium 4.6; Sodium 136  Recent Lipid Panel    Component Value Date/Time   CHOL 186 11/16/2019 1419   CHOL 211 (H) 06/28/2016 1317   TRIG 152 (H) 11/16/2019 1419   TRIG 204 (H) 06/28/2016 1317   HDL 51 11/16/2019 1419   VLDL 41 (H) 06/28/2016 1317   LDLCALC 108 (H) 11/16/2019 1419    Physical Exam:    VS:  BP 110/84 (BP Location: Right Arm, Patient Position: Sitting, Cuff Size: Large)   Pulse 100   Ht 5\' 2"  (1.575 m)   Wt 230 lb 2 oz (104.4 kg)   SpO2 98%   BMI 42.09 kg/m     Wt Readings from Last 3 Encounters:  03/24/20 230 lb 2 oz (104.4 kg)  03/10/20 233 lb (105.7 kg)  02/14/20 238 lb 2 oz (108 kg)     GEN:  Well nourished, well developed in no  acute distress HEENT: Normal NECK: No JVD; No carotid bruits LYMPHATICS: No lymphadenopathy CARDIAC: Irregular irregular, no murmurs, rubs, gallops RESPIRATORY:  Clear to auscultation without rales, wheezing or rhonchi  ABDOMEN: Soft, non-tender, non-distended MUSCULOSKELETAL: 1-2+ edema; No deformity  SKIN: Warm and dry NEUROLOGIC:  Alert and oriented x 3 PSYCHIATRIC:  Normal affect   ASSESSMENT:    1. Persistent atrial fibrillation ([place])   2. Diastolic dysfunction   3. Essential hypertension   4. Morbid  obesity ([place])    PLAN:      1. Patient with persistent atrial fibrillation.  CHA2DS2-VASc of 4.  Continue Toprol-XL to 50 mg daily, continue Xarelto 20 mg daily.  Echo with normal EF, moderately dilated left atrium. 2.  lower extremity edema.  Grade 2 diastolic function.  Holding Lasix due to worsening kidney function.  Repeat creatinine showed stable renal dysfunction.  Keep follow-up appointment with nephrology for input.  Compression stockings/Ace wraps, leg raising maneuvers recommended. 3. Blood pressure normal.  Continue Benicar, Toprol-XL 4. Patient is morbidly obese, low-calorie diet recommended.  Follow-up 2 to 3 months   This note was generated in part or whole with voice recognition software. Voice recognition is usually quite accurate but there are transcription errors that can and very often do occur. I apologize for any typographical errors that were not detected and corrected.  Medication Adjustments/Labs and Tests Ordered: Current medicines are reviewed at length with the patient today.  Concerns regarding medicines are outlined above.  Orders Placed This Encounter  Procedures  . EKG 12-Lead   No orders of the defined types were placed in this encounter.   Patient Instructions  Medication Instructions:   Your physician recommends that you continue on your current medications as directed. Please refer to the Current Medication list given to you  today.  *If you need a refill on your cardiac medications before your next appointment, please call your pharmacy*   Lab Work: None Ordered If you have labs (blood work) drawn today and your tests are completely normal, you will receive your results only by: Marland Kitchen MyChart Message (if you have MyChart) OR . A paper copy in the mail If you have any lab test that is abnormal or we need to change your treatment, we will call you to review the results.   Testing/Procedures: None Ordered   Follow-Up:  At [facility], you and your health needs are our priority.  As part of our continuing mission to provide you with exceptional heart care, we have created designated Provider Care Teams.  These Care Teams include your primary Cardiologist (physician) and Advanced Practice Providers (APPs -  Physician Assistants and Nurse Practitioners) who all work together to provide you with the care you need, when you need it.  We recommend signing up for the patient portal called "MyChart".  Sign up information is provided on this After Visit Summary.  MyChart is used to connect with patients for Virtual Visits (Telemedicine).  Patients are able to view lab/test results, encounter notes, upcoming appointments, etc.  Non-urgent messages can be sent to your provider as well.   To learn more about what you can do with MyChart, go to [email or web address].    Your next appointment:   3 month(s), After your Nephrology appointment  The format for your next appointment:   In Person  Provider:   Kate Sable, MD   Other Instructions N/A     Signed, Kate Sable, MD  03/24/2020 3:22 PM    [place]

## 2020-03-28 ENCOUNTER — Other Ambulatory Visit: Payer: Self-pay | Admitting: Family Medicine

## 2020-03-28 NOTE — Telephone Encounter (Signed)
Requested Prescriptions  Pending Prescriptions Disp Refills  . metFORMIN (GLUCOPHAGE-XR) 500 MG 24 hr tablet [Pharmacy Med Name: METFORMIN ER 500MG 24HR TABS] 360 tablet 1    Sig: TAKE 2 TABLETS(1000 MG) BY MOUTH TWICE DAILY     Endocrinology:  Diabetes - Biguanides Failed - 03/28/2020  3:20 PM      Failed - Cr in normal range and within 360 days    Creatinine, Ser  Date Value Ref Range Status  03/21/2020 3.11 (H) 0.44 - 1.00 mg/dL Final         Failed - eGFR in normal range and within 360 days    GFR calc Af Amer  Date Value Ref Range Status  03/21/2020 16 (L) >60 mL/min Final   GFR calc non Af Amer  Date Value Ref Range Status  03/21/2020 14 (L) >60 mL/min Final         Passed - HBA1C is between 0 and 7.9 and within 180 days    Hemoglobin A1C  Date Value Ref Range Status  06/28/2016 6.6  Final   HB A1C (BAYER DCA - WAIVED)  Date Value Ref Range Status  02/14/2020 6.5 <7.0 % Final    Comment:                                          Diabetic Adult            <7.0                                       Healthy Adult        4.3 - 5.7                                                           (DCCT/NGSP) American Diabetes Association's Summary of Glycemic Recommendations for Adults with Diabetes: Hemoglobin A1c <7.0%. More stringent glycemic goals (A1c <6.0%) may further reduce complications at the cost of increased risk of hypoglycemia.          Passed - Valid encounter within last 6 months    Recent Outpatient Visits          1 month ago Type 2 diabetes mellitus with stage 2 chronic kidney disease, without long-term current use of insulin ([place])   [place], [name], DO   3 months ago New onset atrial fibrillation ([place])   [place], [name], DO   4 months ago New onset atrial fibrillation [facility])   [place], [name], DO   4 months ago Type 2 diabetes mellitus with stage 2 chronic kidney disease, without  long-term current use of insulin ([place])   [place], [name], DO   8 months ago Right wrist pain   [place], [place], DO      Future Appointments            In 1 month Johnson, Barb Merino, DO [employer], [place]   In 1 month  [employer], [place]   In 3 months [place], Aaron Edelman, MD [facility]  [place], [place]

## 2020-03-30 ENCOUNTER — Ambulatory Visit: Payer: Medicare PPO | Admitting: Cardiology

## 2020-04-13 DIAGNOSIS — M170 Bilateral primary osteoarthritis of knee: Secondary | ICD-10-CM | POA: Insufficient documentation

## 2020-04-13 DIAGNOSIS — E119 Type 2 diabetes mellitus without complications: Secondary | ICD-10-CM | POA: Insufficient documentation

## 2020-04-19 DIAGNOSIS — N1831 Chronic kidney disease, stage 3a: Secondary | ICD-10-CM | POA: Diagnosis not present

## 2020-04-19 DIAGNOSIS — N179 Acute kidney failure, unspecified: Secondary | ICD-10-CM | POA: Diagnosis not present

## 2020-04-19 DIAGNOSIS — R600 Localized edema: Secondary | ICD-10-CM | POA: Diagnosis not present

## 2020-04-19 DIAGNOSIS — E1122 Type 2 diabetes mellitus with diabetic chronic kidney disease: Secondary | ICD-10-CM | POA: Diagnosis not present

## 2020-04-19 DIAGNOSIS — I10 Essential (primary) hypertension: Secondary | ICD-10-CM | POA: Diagnosis not present

## 2020-04-27 ENCOUNTER — Encounter: Payer: Self-pay | Admitting: Family Medicine

## 2020-04-28 ENCOUNTER — Other Ambulatory Visit: Payer: Self-pay | Admitting: Nephrology

## 2020-04-28 DIAGNOSIS — N179 Acute kidney failure, unspecified: Secondary | ICD-10-CM

## 2020-05-08 ENCOUNTER — Ambulatory Visit
Admission: RE | Admit: 2020-05-08 | Discharge: 2020-05-08 | Disposition: A | Discharge status: Home / Self Care | Payer: Medicare PPO | Source: Ambulatory Visit | Attending: Nephrology | Admitting: Nephrology

## 2020-05-08 ENCOUNTER — Other Ambulatory Visit: Payer: Self-pay

## 2020-05-08 DIAGNOSIS — N179 Acute kidney failure, unspecified: Secondary | ICD-10-CM

## 2020-05-08 DIAGNOSIS — Q600 Renal agenesis, unilateral: Secondary | ICD-10-CM | POA: Diagnosis not present

## 2020-05-16 ENCOUNTER — Encounter: Payer: Medicare PPO | Admitting: Family Medicine

## 2020-05-22 ENCOUNTER — Encounter: Payer: Medicare PPO | Admitting: Family Medicine

## 2020-05-22 ENCOUNTER — Ambulatory Visit (INDEPENDENT_AMBULATORY_CARE_PROVIDER_SITE_OTHER): Payer: Medicare PPO

## 2020-05-22 VITALS — Ht 61.5 in | Wt 243.0 lb

## 2020-05-22 DIAGNOSIS — Z0000 Encounter for general adult medical examination without abnormal findings: Secondary | ICD-10-CM

## 2020-05-22 NOTE — Patient Instructions (Signed)
Karen Farley , Thank you for taking time to come for your Medicare Wellness Visit. I appreciate your ongoing commitment to your health goals. Please review the following plan we discussed and let me know if I can assist you in the future.   Screening recommendations/referrals: Colonoscopy: not required Mammogram: patient to schedule Bone Density: completed 08/26/2019 Recommended yearly ophthalmology/optometry visit for glaucoma screening and checkup Recommended yearly dental visit for hygiene and checkup  Vaccinations: Influenza vaccine: due Pneumococcal vaccine: completed 01/22/2018 Tdap vaccine: due Shingles vaccine: discussed   Covid-19:12/24/2019, 12/03/2019  Advanced directives: Please bring a copy of your POA (Power of Attorney) and/or Living Will to your next appointment.   Conditions/risks identified: fluid retention  Next appointment: Follow up in one year for your annual wellness visit    Preventive Care 65 Years and Older, Female Preventive care refers to lifestyle choices and visits with your health care provider that can promote health and wellness. What does preventive care include?  A yearly physical exam. This is also called an annual well check.  Dental exams once or twice a year.  Routine eye exams. Ask your health care provider how often you should have your eyes checked.  Personal lifestyle choices, including:  Daily care of your teeth and gums.  Regular physical activity.  Eating a healthy diet.  Avoiding tobacco and drug use.  Limiting alcohol use.  Practicing safe sex.  Taking low-dose aspirin every day.  Taking vitamin and mineral supplements as recommended by your health care provider. What happens during an annual well check? The services and screenings done by your health care provider during your annual well check will depend on your age, overall health, lifestyle risk factors, and family history of disease. Counseling  Your health care  provider may ask you questions about your:  Alcohol use.  Tobacco use.  Drug use.  Emotional well-being.  Home and relationship well-being.  Sexual activity.  Eating habits.  History of falls.  Memory and ability to understand (cognition).  Work and work Statistician.  Reproductive health. Screening  You may have the following tests or measurements:  Height, weight, and BMI.  Blood pressure.  Lipid and cholesterol levels. These may be checked every 5 years, or more frequently if you are over 67 years old.  Skin check.  Lung cancer screening. You may have this screening every year starting at age 50 if you have a 30-pack-year history of smoking and currently smoke or have quit within the past 15 years.  Fecal occult blood test (FOBT) of the stool. You may have this test every year starting at age 68.  Flexible sigmoidoscopy or colonoscopy. You may have a sigmoidoscopy every 5 years or a colonoscopy every 10 years starting at age 17.  Hepatitis C blood test.  Hepatitis B blood test.  Sexually transmitted disease (STD) testing.  Diabetes screening. This is done by checking your blood sugar (glucose) after you have not eaten for a while (fasting). You may have this done every 1-3 years.  Bone density scan. This is done to screen for osteoporosis. You may have this done starting at age 17.  Mammogram. This may be done every 1-2 years. Talk to your health care provider about how often you should have regular mammograms. Talk with your health care provider about your test results, treatment options, and if necessary, the need for more tests. Vaccines  Your health care provider may recommend certain vaccines, such as:  Influenza vaccine. This is recommended every year.  Tetanus, diphtheria, and acellular pertussis (Tdap, Td) vaccine. You may need a Td booster every 10 years.  Zoster vaccine. You may need this after age 69.  Pneumococcal 13-valent conjugate (PCV13)  vaccine. One dose is recommended after age 46.  Pneumococcal polysaccharide (PPSV23) vaccine. One dose is recommended after age 18. Talk to your health care provider about which screenings and vaccines you need and how often you need them. This information is not intended to replace advice given to you by your health care provider. Make sure you discuss any questions you have with your health care provider. Document Released: 11/03/2015 Document Revised: 06/26/2016 Document Reviewed: 08/08/2015 Elsevier Interactive Patient Education  2017 [employer] Prevention in the Home Falls can cause injuries. They can happen to people of all ages. There are many things you can do to make your home safe and to help prevent falls. What can I do on the outside of my home?  Regularly fix the edges of walkways and driveways and fix any cracks.  Remove anything that might make you trip as you walk through a door, such as a raised step or threshold.  Trim any bushes or trees on the path to your home.  Use bright outdoor lighting.  Clear any walking paths of anything that might make someone trip, such as rocks or tools.  Regularly check to see if handrails are loose or broken. Make sure that both sides of any steps have handrails.  Any raised decks and porches should have guardrails on the edges.  Have any leaves, snow, or ice cleared regularly.  Use sand or salt on walking paths during winter.  Clean up any spills in your garage right away. This includes oil or grease spills. What can I do in the bathroom?  Use night lights.  Install grab bars by the toilet and in the tub and shower. Do not use towel bars as grab bars.  Use non-skid mats or decals in the tub or shower.  If you need to sit down in the shower, use a plastic, non-slip stool.  Keep the floor dry. Clean up any water that spills on the floor as soon as it happens.  Remove soap buildup in the tub or shower  regularly.  Attach bath mats securely with double-sided non-slip rug tape.  Do not have throw rugs and other things on the floor that can make you trip. What can I do in the bedroom?  Use night lights.  Make sure that you have a light by your bed that is easy to reach.  Do not use any sheets or blankets that are too big for your bed. They should not hang down onto the floor.  Have a firm chair that has side arms. You can use this for support while you get dressed.  Do not have throw rugs and other things on the floor that can make you trip. What can I do in the kitchen?  Clean up any spills right away.  Avoid walking on wet floors.  Keep items that you use a lot in easy-to-reach places.  If you need to reach something above you, use a strong step stool that has a grab bar.  Keep electrical cords out of the way.  Do not use floor polish or wax that makes floors slippery. If you must use wax, use non-skid floor wax.  Do not have throw rugs and other things on the floor that can make you trip. What can I do  with my stairs?  Do not leave any items on the stairs.  Make sure that there are handrails on both sides of the stairs and use them. Fix handrails that are broken or loose. Make sure that handrails are as long as the stairways.  Check any carpeting to make sure that it is firmly attached to the stairs. Fix any carpet that is loose or worn.  Avoid having throw rugs at the top or bottom of the stairs. If you do have throw rugs, attach them to the floor with carpet tape.  Make sure that you have a light switch at the top of the stairs and the bottom of the stairs. If you do not have them, ask someone to add them for you. What else can I do to help prevent falls?  Wear shoes that:  Do not have high heels.  Have rubber bottoms.  Are comfortable and fit you well.  Are closed at the toe. Do not wear sandals.  If you use a stepladder:  Make sure that it is fully  opened. Do not climb a closed stepladder.  Make sure that both sides of the stepladder are locked into place.  Ask someone to hold it for you, if possible.  Clearly mark and make sure that you can see:  Any grab bars or handrails.  First and last steps.  Where the edge of each step is.  Use tools that help you move around (mobility aids) if they are needed. These include:  Canes.  Walkers.  Scooters.  Crutches.  Turn on the lights when you go into a dark area. Replace any light bulbs as soon as they burn out.  Set up your furniture so you have a clear path. Avoid moving your furniture around.  If any of your floors are uneven, fix them.  If there are any pets around you, be aware of where they are.  Review your medicines with your doctor. Some medicines can make you feel dizzy. This can increase your chance of falling. Ask your doctor what other things that you can do to help prevent falls. This information is not intended to replace advice given to you by your health care provider. Make sure you discuss any questions you have with your health care provider. Document Released: 08/03/2009 Document Revised: 03/14/2016 Document Reviewed: 11/11/2014 Elsevier Interactive Patient Education  2017 [employer].

## 2020-05-22 NOTE — Progress Notes (Signed)
I connected with Karen Farley today by telephone and verified that I am speaking with the correct person using two identifiers. Location patient: home Location provider: work Persons participating in the virtual visit: Karen Farley, Karen Durand LPN.   I discussed the limitations, risks, security and privacy concerns of performing an evaluation and management service by telephone and the availability of in person appointments. I also discussed with the patient that there may be a patient responsible charge related to this service. The patient expressed understanding and verbally consented to this telephonic visit.    Interactive audio and video telecommunications were attempted between this provider and patient, however failed, due to patient having technical difficulties OR patient did not have access to video capability.  We continued and completed visit with audio only.    Vital signs may be patient reported or missing.  Subjective:   Karen Farley is a 74 y.o. female who presents for Medicare Annual (Subsequent) preventive examination.  Review of Systems     Cardiac Risk Factors include: advanced age (>28mn, >>75women);diabetes mellitus;dyslipidemia;hypertension;obesity (BMI >30kg/m2);sedentary lifestyle     Objective:    Today's Vitals   05/22/20 1341  Weight: 243 lb (110.2 kg)  Height: 5' 1.5" (1.562 m)   Body mass index is 45.17 kg/m.  Advanced Directives 05/22/2020 01/22/2018 12/24/2016 07/01/2016 05/02/2016 03/25/2016 03/13/2016  Does Patient Have a Medical Advance Directive? Yes Yes Yes Yes No Yes Yes  Type of Advance Directive Living will;Healthcare Power of Attorney Living will;[place]care Power of [place] - - - [place]  Does patient want to make changes to medical advance directive? - - No - Patient declined - - - No - Patient declined  Copy of [place] Chart? No -  copy requested No - copy requested No - copy requested - - Yes -  Would patient like information on creating a medical advance directive? - - - - - - -    Current Medications (verified) Outpatient Encounter Medications as of 05/22/2020  Medication Sig  . benazepril (LOTENSIN) 20 MG tablet Take 1 tablet (20 mg total) by mouth daily.  .Regino SchultzeBandages & Supports (MEDICAL COMPRESSION STOCKINGS) MISC by Does not apply route.  . latanoprost (XALATAN) 0.005 % ophthalmic solution 1 drop at bedtime.  . Multiple Vitamin (MULTIVITAMIN WITH MINERALS) TABS tablet Take 1 tablet by mouth daily.  .Glory RosebushULTRA test strip USE TO TEST EVERY DAY AS DIRECTED  . traMADol (ULTRAM) 50 MG tablet Take 1-2 tablets (50-100 mg total) by mouth every 6 (six) hours as needed.  . Vitamin D, Cholecalciferol, 50 MCG (2000 UT) CAPS Take 1 tablet by mouth.  .Alveda Reasons20 MG TABS tablet TAKE 1 TABLET(20 MG) BY MOUTH DAILY WITH SUPPER  . metFORMIN (GLUCOPHAGE-XR) 500 MG 24 hr tablet TAKE 2 TABLETS(1000 MG) BY MOUTH TWICE DAILY (Patient not taking: Reported on 05/22/2020)  . metoprolol succinate (TOPROL-XL) 50 MG 24 hr tablet Take 1 tablet (50 mg total) by mouth daily. Take with or immediately following a meal.   No facility-administered encounter medications on file as of 05/22/2020.    Allergies (verified) Patient has no known allergies.   History: Past Medical History:  Diagnosis Date  . 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 DDD (degenerative disc disease), lumbar   . Degenerative disc disease, lumbar    bulging and dengerated  . Diabetes mellitus without complication ([place]   .  Hyperlipidemia   . Hypertension   . Osteoarthritis of both knees   . Osteopenia    Past Surgical History:  Procedure Laterality Date  . CHOLECYSTECTOMY    . DILATION AND CURETTAGE OF UTERUS    . TEAR DUCT PROBING WITH STRABISMUS REPAIR Right   . TONSILLECTOMY     Family History  Problem Relation Age of Onset  . Heart  disease Mother        CHF  . Osteoporosis Mother   . Breast cancer Mother   . Heart disease Father 59  . Cancer Brother        Colon CA- 2002- Youngest brother  . Parkinson's disease Brother        Younger Brother  . Heart disease Brother        2 brothers  . Breast cancer Maternal Grandmother    Social History   Socioeconomic History  . Marital status: Married    Spouse name: Not on file  . Number of children: Not on file  . Years of education: Not on file  . Highest education level: Not on file  Occupational History  . Not on file  Tobacco Use  . Smoking status: Never Smoker  . Smokeless tobacco: Never Used  Vaping Use  . Vaping Use: Never used  Substance and Sexual Activity  . Alcohol use: Not Currently    Alcohol/week: 0.0 standard drinks    Comment: on rare occasion  . Drug use: No  . Sexual activity: Not Currently  Other Topics Concern  . Not on file  Social History Narrative  . Not on file   Social Determinants of Health   Financial Resource Strain: Low Risk   . Difficulty of Paying Living Expenses: Not hard at all  Food Insecurity: No Food Insecurity  . Worried About Charity fundraiser in the Last Year: Never true  . Ran Out of Food in the Last Year: Never true  Transportation Needs: No Transportation Needs  . Lack of Transportation (Medical): No  . Lack of Transportation (Non-Medical): No  Physical Activity: Inactive  . Days of Exercise per Week: 0 days  . Minutes of Exercise per Session: 0 min  Stress: No Stress Concern Present  . Feeling of Stress : Not at all  Social Connections:   . Frequency of Communication with Friends and Family:   . Frequency of Social Gatherings with Friends and Family:   . Attends Religious Services:   . Active Member of Clubs or Organizations:   . Attends Archivist Meetings:   Marland Kitchen Marital Status:     Tobacco Counseling Counseling given: Not Answered   Clinical Intake:  Pre-visit preparation completed:  Yes  Pain : No/denies pain     Nutritional Status: BMI > 30  Obese Nutritional Risks: None Diabetes: Yes  How often do you need to have someone help you when you read instructions, pamphlets, or other written materials from your doctor or pharmacy?: 1 - Never What is the last grade level you completed in school?: 2 yrs college  Diabetic? Yes Nutrition Risk Assessment:  Has the patient had any N/V/D within the last 2 months?  No  Does the patient have any non-healing wounds?  No  Has the patient had any unintentional weight loss or weight gain?  Yes   Diabetes:  Is the patient diabetic?  Yes  If diabetic, was a CBG obtained today?  No  Did the patient bring in their glucometer from home?  No  How often do you monitor your CBG's? daily.   Financial Strains and Diabetes Management:  Are you having any financial strains with the device, your supplies or your medication? No .  Does the patient want to be seen by Chronic Care Management for management of their diabetes?  No  Would the patient like to be referred to a Nutritionist or for Diabetic Management?  No   Diabetic Exams:  Diabetic Eye Exam: Overdue for diabetic eye exam. Pt has been advised about the importance in completing this exam. Patient advised to call and schedule an eye exam. Diabetic Foot Exam: Overdue, Pt has been advised about the importance in completing this exam. Pt is scheduled for diabetic foot exam on next appointment.   Interpreter Needed?: No  Information entered by :: NAllen LPN   Activities of Daily Living In your present state of health, do you have any difficulty performing the following activities: 05/22/2020  Hearing? N  Vision? N  Difficulty concentrating or making decisions? N  Walking or climbing stairs? N  Dressing or bathing? N  Doing errands, shopping? N  Preparing Food and eating ? N  Using the Toilet? N  In the past six months, have you accidently leaked urine? N  Do you have  problems with loss of bowel control? N  Managing your Medications? N  Managing your Finances? N  Housekeeping or managing your Housekeeping? N  Some recent data might be hidden    Patient Care Team: Valerie Roys, DO as PCP - General (Family Medicine) Kate Sable, MD as PCP - Cardiology (Cardiology) De Hollingshead, [facility] (Pharmacist)  Indicate any recent Medical Services you may have received from other than [facility] providers in the past year (date may be approximate).     Assessment:   This is a routine wellness examination for Dezhane.  Hearing/Vision screen  Hearing Screening   '125Hz'$  '250Hz'$  '500Hz'$  '1000Hz'$  '2000Hz'$  '3000Hz'$  '4000Hz'$  '6000Hz'$  '8000Hz'$   Right ear:           Left ear:           Vision Screening Comments: Regular eye exams, [facility]  Dietary issues and exercise activities discussed: Current Exercise Habits: The patient does not participate in regular exercise at present  Goals    .  DIET - INCREASE WATER INTAKE      ecommend drinking at least 6-8 glasses of water a day     .  Patient Stated      05/22/2020, like to get fluid retention under control    .  PharmD "I want to afford my medications" (pt-stated)      CARE PLAN ENTRY (see longtitudinal plan of care for additional care plan information)  Current Barriers:  . Polypharmacy; complex patient with multiple comorbidities including new onset atrial fibrillation, HTN, T2DM, CKD, chronic pain, HLD  . New onset Afib found by PCP; started on Xarelto and metoprolol and now followed by cardiology Dr. Garen Lah. Reports copay for Xarelto is $40/month, wonders if there is anything cheaper . Most recent eGFR: 28 o Afib/HTN, some LEE but preserved ED: CHADS2VASc (4- age, female, HTN, DM); metoprolol succinate 50 mg daily, benazepril 40 mg daily, furosemide 40 mg daily; Xarelto 20 mg daily (eGFR right at cut off for dose reduction); reports concerns w/ copay o T2DM: last A1c 6.5%; metformin XR 1000 mg  BID o ASCVD risk reduction: previously declined statin therapy; 10 year ASCVD risk 23% o Chronic pain: tramadol 50 mg PRN  Pharmacist Clinical Goal(s):  Marland Kitchen Over the next 90 days, patient will work with PharmD and provider towards optimized medication management  Interventions: . Comprehensive medication review performed; medication list updated in electronic medical record . Discussed afib, including physiologic reason for anticoagulant and beta blocker therapy.  . Reviewed Xarelto patient assistance program criteria. Patient meets income criteria, but does not meet out of pocket spend requirement right now (program requires that patient spend 4% of total yearly household income on copays prior to approval). Discussed that Eliquis program is more attainable (only 3% of total household income required). Mailing written financial information to patient; she will review her household income and we will discuss moving forward. Noted that the program for Eliquis will also accept her husband's copay spend towards the total.  . Patient asked how her lab work looked. We reviewed CKD, but spent a great amount of time talking about cholesterol, as well as ASCVD risk and benefit of statin therapy given DM and risk factors. Explained mechanism of statins, and that we would choose statins (eg rosuvastatin) less likely to cause muscle symptoms. Patient concerned about adding another life long medication, but notes that she will contemplate this and discuss more w/ Dr. Wynetta Emery at upcoming appointment in April. We discussed moderate intensity option of rosuvastatin 5 mg daily  Patient Self Care Activities:  . Patient will take medications as prescribed  Initial goal documentation       Depression Screen PHQ 2/9 Scores 05/22/2020 04/27/2019 07/10/2018 01/22/2018 12/24/2016 05/02/2016 03/25/2016  PHQ - 2 Score 0 0 0 0 0 0 -  PHQ- 9 Score 0 0 - - - - -  Exception Documentation - - - - - - Patient refusal    Fall  Risk Fall Risk  05/22/2020 04/27/2019 07/10/2018 01/22/2018 12/24/2016  Falls in the past year? 1 0 No No No  Comment lost balance - - - -  Number falls in past yr: 0 0 - - -  Injury with Fall? 1 0 - - -  Comment broke right wrist - - - -  Risk for fall due to : History of fall(s);Impaired balance/gait - - - -  Follow up Falls evaluation completed;Education provided;Falls prevention discussed - - - -    Any stairs in or around the home? No  If so, are there any without handrails? n/a Home free of loose throw rugs in walkways, pet beds, electrical cords, etc? Yes  Adequate lighting in your home to reduce risk of falls? Yes   ASSISTIVE DEVICES UTILIZED TO PREVENT FALLS:  Life alert? No  Use of a cane, walker or w/c? Yes  Grab bars in the bathroom? No  Shower chair or bench in shower? Yes  Elevated toilet seat or a handicapped toilet? No   TIMED UP AND GO:  Was the test performed? No .    Cognitive Function:     6CIT Screen 05/22/2020 04/27/2019 01/22/2018 12/24/2016  What Year? 0 points 0 points 0 points 0 points  What month? 0 points 0 points 0 points 0 points  What time? 0 points 0 points 0 points 0 points  Count back from 20 0 points 0 points 0 points 0 points  Months in reverse 0 points 0 points 0 points 2 points  Repeat phrase 0 points 0 points 0 points 0 points  Total Score 0 0 0 2    Immunizations Immunization History  Administered Date(s) Administered  . Influenza, High Dose Seasonal PF 07/22/2019  . PFIZER  SARS-COV-2 Vaccination 12/03/2019, 12/24/2019  . Pneumococcal Conjugate-13 01/22/2018  . Pneumococcal Polysaccharide-23 11/05/2011  . Td 10/21/2008    TDAP status: Due, Education has been provided regarding the importance of this vaccine. Advised may receive this vaccine at local pharmacy or Health Dept. Aware to provide a copy of the vaccination record if obtained from local pharmacy or Health Dept. Verbalized acceptance and understanding. Flu Vaccine status: later in  season Pneumococcal vaccine status: Up to date Covid-19 vaccine status: Completed vaccines  Qualifies for Shingles Vaccine? Yes   Zostavax completed No   Shingrix Completed?: No.    Education has been provided regarding the importance of this vaccine. Patient has been advised to call insurance company to determine out of pocket expense if they have not yet received this vaccine. Advised may also receive vaccine at local pharmacy or Health Dept. Verbalized acceptance and understanding.  Screening Tests Health Maintenance  Topic Date Due  . TETANUS/TDAP  10/21/2018  . FOOT EXAM  04/26/2020  . OPHTHALMOLOGY EXAM  05/04/2020  . INFLUENZA VACCINE  05/21/2020  . COLONOSCOPY  11/14/2020 (Originally 04/08/2013)  . MAMMOGRAM  07/22/2020  . HEMOGLOBIN A1C  08/15/2020  . DEXA SCAN  Completed  . COVID-19 Vaccine  Completed  . Hepatitis C Screening  Completed  . PNA vac Low Risk Adult  Completed    Health Maintenance  Health Maintenance Due  Topic Date Due  . TETANUS/TDAP  10/21/2018  . FOOT EXAM  04/26/2020  . OPHTHALMOLOGY EXAM  05/04/2020  . INFLUENZA VACCINE  05/21/2020    Colorectal cancer screening: No longer required.  Mammogram status: Patient will schedule Bone Density status: Completed 08/26/2019.   Lung Cancer Screening: (Low Dose CT Chest recommended if Age 42-80 years, 30 pack-year currently smoking OR have quit w/in 15years.) does not qualify.   Lung Cancer Screening Referral: no  Additional Screening:  Hepatitis C Screening: does qualify; Completed 04/27/2019  Vision Screening: Recommended annual ophthalmology exams for early detection of glaucoma and other disorders of the eye. Is the patient up to date with their annual eye exam?  Yes  Who is the provider or what is the name of the office in which the patient attends annual eye exams? [facility] If pt is not established with a provider, would they like to be referred to a provider to establish care? No .    Dental Screening: Recommended annual dental exams for proper oral hygiene  Community Resource Referral / Chronic Care Management: CRR required this visit?  No   CCM required this visit?  No      Plan:     I have personally reviewed and noted the following in the patient's chart:   . Medical and social history . Use of alcohol, tobacco or illicit drugs  . Current medications and supplements . Functional ability and status . Nutritional status . Physical activity . Advanced directives . List of other physicians . Hospitalizations, surgeries, and ER visits in previous 12 months . Vitals . Screenings to include cognitive, depression, and falls . Referrals and appointments  In addition, I have reviewed and discussed with patient certain preventive protocols, quality metrics, and best practice recommendations. A written personalized care plan for preventive services as well as general preventive health recommendations were provided to patient.     Kellie Simmering, LPN   04/29/239   Nurse Notes: Has been retaining fluid. Has been to the kidney center. Has a follow up scheduled. Patient is due for mammogram. States  that she will schedule.

## 2020-05-25 DIAGNOSIS — E119 Type 2 diabetes mellitus without complications: Secondary | ICD-10-CM | POA: Diagnosis not present

## 2020-05-25 DIAGNOSIS — N1832 Chronic kidney disease, stage 3b: Secondary | ICD-10-CM | POA: Diagnosis not present

## 2020-05-25 DIAGNOSIS — I890 Lymphedema, not elsewhere classified: Secondary | ICD-10-CM | POA: Diagnosis not present

## 2020-05-25 LAB — HM DIABETES EYE EXAM

## 2020-06-06 ENCOUNTER — Encounter: Payer: Self-pay | Admitting: Family Medicine

## 2020-06-06 ENCOUNTER — Ambulatory Visit (INDEPENDENT_AMBULATORY_CARE_PROVIDER_SITE_OTHER): Payer: Medicare PPO | Admitting: Family Medicine

## 2020-06-06 ENCOUNTER — Other Ambulatory Visit: Payer: Self-pay

## 2020-06-06 VITALS — BP 112/76 | HR 110 | Temp 98.3°F | Ht 65.0 in | Wt 233.0 lb

## 2020-06-06 DIAGNOSIS — Z0000 Encounter for general adult medical examination without abnormal findings: Secondary | ICD-10-CM

## 2020-06-06 DIAGNOSIS — N183 Chronic kidney disease, stage 3 unspecified: Secondary | ICD-10-CM

## 2020-06-06 DIAGNOSIS — N182 Chronic kidney disease, stage 2 (mild): Secondary | ICD-10-CM

## 2020-06-06 DIAGNOSIS — I129 Hypertensive chronic kidney disease with stage 1 through stage 4 chronic kidney disease, or unspecified chronic kidney disease: Secondary | ICD-10-CM | POA: Diagnosis not present

## 2020-06-06 DIAGNOSIS — E782 Mixed hyperlipidemia: Secondary | ICD-10-CM

## 2020-06-06 DIAGNOSIS — E1122 Type 2 diabetes mellitus with diabetic chronic kidney disease: Secondary | ICD-10-CM | POA: Diagnosis not present

## 2020-06-06 LAB — URINALYSIS, ROUTINE W REFLEX MICROSCOPIC
Bilirubin, UA: NEGATIVE
Glucose, UA: NEGATIVE
Ketones, UA: NEGATIVE
Nitrite, UA: NEGATIVE
Protein,UA: NEGATIVE
RBC, UA: NEGATIVE
Specific Gravity, UA: 1.02 (ref 1.005–1.030)
Urobilinogen, Ur: 0.2 mg/dL (ref 0.2–1.0)
pH, UA: 5 (ref 5.0–7.5)

## 2020-06-06 LAB — BAYER DCA HB A1C WAIVED: HB A1C (BAYER DCA - WAIVED): 6.7 % (ref <7.0)

## 2020-06-06 LAB — MICROSCOPIC EXAMINATION
Bacteria, UA: NONE SEEN
RBC, Urine: NONE SEEN /hpf (ref 0–2)

## 2020-06-06 LAB — MICROALBUMIN, URINE WAIVED
Creatinine, Urine Waived: 200 mg/dL (ref 10–300)
Microalb, Ur Waived: 30 mg/L — ABNORMAL HIGH (ref 0–19)
Microalb/Creat Ratio: <30 mg/g (ref <30)

## 2020-06-06 MED ORDER — METFORMIN HCL ER 500 MG PO TB24: 500.0000 mg | ORAL_TABLET | Freq: Two times a day (BID) | ORAL | 1 refills | Status: DC

## 2020-06-06 MED ORDER — BENAZEPRIL HCL 20 MG PO TABS: 20.0000 mg | ORAL_TABLET | Freq: Every day | ORAL | 1 refills | Status: DC

## 2020-06-06 MED ORDER — RIVAROXABAN 20 MG PO TABS: ORAL_TABLET | ORAL | 3 refills | Status: DC

## 2020-06-06 NOTE — Progress Notes (Signed)
BP 112/76   Pulse (!) 110   Temp 98.3 F (36.8 C) (Oral)   Ht 5\' 5"  (1.651 m)   Wt 233 lb (105.7 kg)   SpO2 95%   BMI 38.77 kg/m    Subjective:    Patient ID: Karen Farley, female    DOB: May 11, 1946, 74 y.o.   MRN: [id number]  HPI: Karen Farley is a 74 y.o. female presenting on 06/06/2020 for comprehensive medical examination. Current medical complaints include:  Has not been feeling well. She notes that her cardiologist changed her diuretic and that caused her kidneys to not work as well. They stopped her diuretic and she went to see the nephrologist. She has been off her metformin. Normal [place] of kidneys and her bladder. Kidney function has been going up and down. Saw Dr. Candiss Norse last week. Prior to that she was feeling really SOB. She notes that this started right after she stopped the diuretic. She gained 15lbs of fluid over a few days. Feeling better now. Most of the fluid has come off, not SOB any more.   DIABETES- had been off her metformin for a few weeks due to kidney function Hypoglycemic episodes:no Polydipsia/polyuria: no Visual disturbance: no Chest pain: no Paresthesias: no Glucose Monitoring: yes  Accucheck frequency: Daily  Fasting glucose: 114-130 prior to having to stop metformin Taking Insulin?: no Blood Pressure Monitoring: not checking Retinal Examination: Up to Date Foot Exam: Up to Date Diabetic Education: Completed Pneumovax: Up to Date Influenza: Up to Date Aspirin: yes  HYPERTENSION / HYPERLIPIDEMIA Satisfied with current treatment? yes Duration of hypertension: chronic BP monitoring frequency: not checking BP medication side effects: no Duration of hyperlipidemia: chronic Cholesterol medication side effects: not on anything Cholesterol supplements: none Past cholesterol medications: none Medication compliance: excellent compliance Aspirin: no Recent stressors: no Recurrent headaches: no Visual changes: no Palpitations: no Dyspnea:  no Chest pain: no Lower extremity edema: no Dizzy/lightheaded: no  She currently lives with: husband Menopausal Symptoms: no  Depression Screen done today and results listed below:  Depression screen [facility] 2/9 05/22/2020 04/27/2019 07/10/2018 01/22/2018 12/24/2016  Decreased Interest 0 0 0 0 0  Down, Depressed, Hopeless 0 0 0 0 0  PHQ - 2 Score 0 0 0 0 0  Altered sleeping 0 0 - - -  Tired, decreased energy 0 0 - - -  Change in appetite 0 0 - - -  Feeling bad or failure about yourself  0 0 - - -  Trouble concentrating 0 0 - - -  Moving slowly or fidgety/restless 0 0 - - -  Suicidal thoughts 0 0 - - -  PHQ-9 Score 0 0 - - -  Difficult doing work/chores Not difficult at all Not difficult at all - - -    Past Medical History:  Past Medical History:  Diagnosis Date  . 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 DDD (degenerative disc disease), lumbar   . Degenerative disc disease, lumbar    bulging and dengerated  . Diabetes mellitus without complication ([place])   . Hyperlipidemia   . Hypertension   . Osteoarthritis of both knees   . Osteopenia     Surgical History:  Past Surgical History:  Procedure Laterality Date  . CHOLECYSTECTOMY    . DILATION AND CURETTAGE OF UTERUS    . TEAR DUCT PROBING WITH STRABISMUS REPAIR Right   . TONSILLECTOMY      Medications:  Current Outpatient Medications on  File Prior to Visit  Medication Sig  . Elastic Bandages & Supports (MEDICAL COMPRESSION STOCKINGS) MISC by Does not apply route.  . latanoprost (XALATAN) 0.005 % ophthalmic solution 1 drop at bedtime.  . Multiple Vitamin (MULTIVITAMIN WITH MINERALS) TABS tablet Take 1 tablet by mouth daily.  Glory Rosebush ULTRA test strip USE TO TEST EVERY DAY AS DIRECTED  . torsemide (DEMADEX) 20 MG tablet Take 20 mg by mouth. One tablet every other day  . traMADol (ULTRAM) 50 MG tablet Take 1-2 tablets (50-100 mg total) by mouth every 6 (six) hours as needed.  . Vitamin D, Cholecalciferol, 50 MCG  (2000 UT) CAPS Take 1 tablet by mouth.  . metoprolol succinate (TOPROL-XL) 50 MG 24 hr tablet Take 1 tablet (50 mg total) by mouth daily. Take with or immediately following a meal.   No current facility-administered medications on file prior to visit.    Allergies:  No Known Allergies  Social History:  Social History   Socioeconomic History  . Marital status: Married    Spouse name: Not on file  . Number of children: Not on file  . Years of education: Not on file  . Highest education level: Not on file  Occupational History  . Not on file  Tobacco Use  . Smoking status: Never Smoker  . Smokeless tobacco: Never Used  Vaping Use  . Vaping Use: Never used  Substance and Sexual Activity  . Alcohol use: Not Currently    Alcohol/week: 0.0 standard drinks    Comment: on rare occasion  . Drug use: No  . Sexual activity: Not Currently  Other Topics Concern  . Not on file  Social History Narrative  . Not on file   Social Determinants of Health   Financial Resource Strain: Low Risk   . Difficulty of Paying Living Expenses: Not hard at all  Food Insecurity: No Food Insecurity  . Worried About Charity fundraiser in the Last Year: Never true  . Ran Out of Food in the Last Year: Never true  Transportation Needs: No Transportation Needs  . Lack of Transportation (Medical): No  . Lack of Transportation (Non-Medical): No  Physical Activity: Inactive  . Days of Exercise per Week: 0 days  . Minutes of Exercise per Session: 0 min  Stress: No Stress Concern Present  . Feeling of Stress : Not at all  Social Connections:   . Frequency of Communication with Friends and Family:   . Frequency of Social Gatherings with Friends and Family:   . Attends Religious Services:   . Active Member of Clubs or Organizations:   . Attends Archivist Meetings:   Marland Kitchen Marital Status:   Intimate Partner Violence:   . Fear of Current or Ex-Partner:   . Emotionally Abused:   Marland Kitchen Physically  Abused:   . Sexually Abused:    Social History   Tobacco Use  Smoking Status Never Smoker  Smokeless Tobacco Never Used   Social History   Substance and Sexual Activity  Alcohol Use Not Currently  . Alcohol/week: 0.0 standard drinks   Comment: on rare occasion    Family History:  Family History  Problem Relation Age of Onset  . Heart disease Mother        CHF  . Osteoporosis Mother   . Breast cancer Mother   . Heart disease Father 1  . Cancer Brother        Colon CA- 2002- Youngest brother  . Parkinson's disease Brother  Younger Brother  . Heart disease Brother        2 brothers  . Breast cancer Maternal Grandmother     Past medical history, surgical history, medications, allergies, family history and social history reviewed with patient today and changes made to appropriate areas of the chart.   Review of Systems  Constitutional: Negative.   HENT: Negative.   Eyes: Negative.   Respiratory: Positive for shortness of breath. Negative for cough, hemoptysis, sputum production and wheezing.   Cardiovascular: Positive for leg swelling. Negative for chest pain, palpitations, orthopnea, claudication and PND.  Gastrointestinal: Positive for constipation. Negative for abdominal pain, blood in stool, diarrhea, heartburn, melena, nausea and vomiting.  Genitourinary: Negative.   Musculoskeletal: Positive for back pain and joint pain. Negative for falls, myalgias and neck pain.  Skin: Negative.   Neurological: Negative.   Endo/Heme/Allergies: Negative.   Psychiatric/Behavioral: Negative.     All other ROS negative except what is listed above and in the HPI.      Objective:    BP 112/76   Pulse (!) 110   Temp 98.3 F (36.8 C) (Oral)   Ht 5\' 5"  (1.651 m)   Wt 233 lb (105.7 kg)   SpO2 95%   BMI 38.77 kg/m   Wt Readings from Last 3 Encounters:  06/06/20 233 lb (105.7 kg)  05/22/20 243 lb (110.2 kg)  03/24/20 230 lb 2 oz (104.4 kg)    Physical Exam Vitals  and nursing note reviewed.  Constitutional:      General: She is not in acute distress.    Appearance: Normal appearance. She is not ill-appearing, toxic-appearing or diaphoretic.  HENT:     Head: Normocephalic and atraumatic.     Right Ear: Tympanic membrane, ear canal and external ear normal. There is no impacted cerumen.     Left Ear: Tympanic membrane, ear canal and external ear normal. There is no impacted cerumen.     Nose: Nose normal. No congestion or rhinorrhea.     Mouth/Throat:     Mouth: Mucous membranes are moist.     Pharynx: Oropharynx is clear. No oropharyngeal exudate or posterior oropharyngeal erythema.  Eyes:     General: No scleral icterus.       Right eye: No discharge.        Left eye: No discharge.     Extraocular Movements: Extraocular movements intact.     Conjunctiva/sclera: Conjunctivae normal.     Pupils: Pupils are equal, round, and reactive to light.  Neck:     Vascular: No carotid bruit.  Cardiovascular:     Rate and Rhythm: Normal rate and regular rhythm.     Pulses: Normal pulses.     Heart sounds: No murmur heard.  No friction rub. No gallop.   Pulmonary:     Effort: Pulmonary effort is normal. No respiratory distress.     Breath sounds: Normal breath sounds. No stridor. No wheezing, rhonchi or rales.  Chest:     Chest wall: No tenderness.  Abdominal:     General: Abdomen is flat. Bowel sounds are normal. There is no distension.     Palpations: Abdomen is soft. There is no mass.     Tenderness: There is no abdominal tenderness. There is no right CVA tenderness, left CVA tenderness, guarding or rebound.     Hernia: No hernia is present.  Genitourinary:    Comments: Breast and pelvic exams deferred with shared decision making Musculoskeletal:  General: No swelling, tenderness, deformity or signs of injury.     Cervical back: Normal range of motion and neck supple. No rigidity. No muscular tenderness.     Right lower leg: Edema present.      Left lower leg: Edema present.  Lymphadenopathy:     Cervical: No cervical adenopathy.  Skin:    General: Skin is warm and dry.     Capillary Refill: Capillary refill takes less than 2 seconds.     Coloration: Skin is not jaundiced or pale.     Findings: No bruising, erythema, lesion or rash.  Neurological:     General: No focal deficit present.     Mental Status: She is alert and oriented to person, place, and time. Mental status is at baseline.     Cranial Nerves: No cranial nerve deficit.     Sensory: No sensory deficit.     Motor: No weakness.     Coordination: Coordination normal.     Gait: Gait normal.     Deep Tendon Reflexes: Reflexes normal.  Psychiatric:        Mood and Affect: Mood normal.        Behavior: Behavior normal.        Thought Content: Thought content normal.        Judgment: Judgment normal.     Results for orders placed or performed in visit on 06/06/20  Microscopic Examination   Urine  Result Value Ref Range   WBC, UA 6-10 (A) 0 - 5 /hpf   RBC None seen 0 - 2 /hpf   Epithelial Cells (non renal) 0-10 0 - 10 /hpf   Bacteria, UA None seen None seen/Few  Bayer DCA Hb A1c Waived  Result Value Ref Range   HB A1C (BAYER DCA - WAIVED) 6.7 <7.0 %  Microalbumin, Urine Waived  Result Value Ref Range   Microalb, Ur Waived 30 (H) 0 - 19 mg/L   Creatinine, Urine Waived 200 10 - 300 mg/dL   Microalb/Creat Ratio <30 <30 mg/g  Urinalysis, Routine w reflex microscopic  Result Value Ref Range   Specific Gravity, UA 1.020 1.005 - 1.030   pH, UA 5.0 5.0 - 7.5   Color, UA Yellow Yellow   Appearance Ur Clear Clear   Leukocytes,UA 1+ (A) Negative   Protein,UA Negative Negative/Trace   Glucose, UA Negative Negative   Ketones, UA Negative Negative   RBC, UA Negative Negative   Bilirubin, UA Negative Negative   Urobilinogen, Ur 0.2 0.2 - 1.0 mg/dL   Nitrite, UA Negative Negative   Microscopic Examination See below:       Assessment & Plan:   Problem List  Items Addressed This Visit      Endocrine   Type 2 diabetes mellitus with renal complication (HCC)    Under good control with A1c of 6.7 when she's been off meds for 2 months. Continue current regimen. Continue to monitor. Recheck 3 months.       Relevant Medications   metFORMIN (GLUCOPHAGE-XR) 500 MG 24 hr tablet   benazepril (LOTENSIN) 20 MG tablet   Other Relevant Orders   Bayer DCA Hb A1c Waived (Completed)   CBC with Differential/Platelet   Comprehensive metabolic panel   Microalbumin, Urine Waived (Completed)   Urinalysis, Routine w reflex microscopic (Completed)   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 diabetes mellitus ([place])    Rechecking labs today. Await results. Treat as needed.       Relevant Medications  metFORMIN (GLUCOPHAGE-XR) 500 MG 24 hr tablet   benazepril (LOTENSIN) 20 MG tablet   Other Relevant Orders   CBC with Differential/Platelet   Comprehensive metabolic panel     Genitourinary   Benign hypertensive renal disease    Under good control on current regimen. Continue current regimen. Continue to monitor. Call with any concerns. Refills given. Labs drawn today.       Relevant Orders   CBC with Differential/Platelet   Comprehensive metabolic panel   Microalbumin, Urine Waived (Completed)   TSH     Other   Hyperlipidemia    Under good control on current regimen. Continue current regimen. Continue to monitor. Call with any concerns. Refills given. Labs drawn today.       Relevant Medications   torsemide (DEMADEX) 20 MG tablet   benazepril (LOTENSIN) 20 MG tablet   rivaroxaban (XARELTO) 20 MG TABS tablet   Other Relevant Orders   CBC with Differential/Platelet   Comprehensive metabolic panel   Lipid Panel w/o Chol/HDL Ratio    Other Visit Diagnoses    Routine general medical examination at a health care facility    -  Primary   Vaccines up to date. Screening labs checked today. Mammogram, colonoscopy and DEXA up to date. Continue diet and exercise. Call  with any concerns.    Morbid obesity ([place])       Encouraged diet and exercise with goal of losing 1-2lbs per week. Continue to monitor.    Relevant Medications   metFORMIN (GLUCOPHAGE-XR) 500 MG 24 hr tablet   Other Relevant Orders   CBC with Differential/Platelet   Comprehensive metabolic panel       Follow up plan: Return in about 3 months (around 09/06/2020).   LABORATORY TESTING:  - Pap smear: not applicable  IMMUNIZATIONS:   - Tdap: Tetanus vaccination status reviewed: Declined. - Influenza: Postponed to flu season - Pneumovax: Up to date - Prevnar: Up to date - COVID: Up to date  SCREENING: -Mammogram: Ordered today  - Colonoscopy: Refused  - Bone Density: Up to date   PATIENT COUNSELING:   Advised to take 1 mg of folate supplement per day if capable of pregnancy.   Sexuality: Discussed sexually transmitted diseases, partner selection, use of condoms, avoidance of unintended pregnancy  and contraceptive alternatives.   Advised to avoid cigarette smoking.  I discussed with the patient that most people either abstain from alcohol or drink within safe limits (<=14/week and <=4 drinks/occasion for males, <=7/weeks and <= 3 drinks/occasion for females) and that the risk for alcohol disorders and other health effects rises proportionally with the number of drinks per week and how often a drinker exceeds daily limits.  Discussed cessation/primary prevention of drug use and availability of treatment for abuse.   Diet: Encouraged to adjust caloric intake to maintain  or achieve ideal body weight, to reduce intake of dietary saturated fat and total fat, to limit sodium intake by avoiding high sodium foods and not adding table salt, and to maintain adequate dietary potassium and calcium preferably from fresh fruits, vegetables, and low-fat dairy products.    stressed the importance of regular exercise  Injury prevention: Discussed safety belts, safety helmets, smoke detector,  smoking near bedding or upholstery.   Dental health: Discussed importance of regular tooth brushing, flossing, and dental visits.    NEXT PREVENTATIVE PHYSICAL DUE IN 1 YEAR. Return in about 3 months (around 09/06/2020).

## 2020-06-06 NOTE — Patient Instructions (Addendum)
[facility] at [facility]  Address: [address], [place], [place] [address]  Phone: [phone]   Health Maintenance After Age 74 After age 70, you are at a higher risk for certain long-term diseases and infections as well as injuries from falls. Falls are a major cause of broken bones and head injuries in people who are older than age 3. Getting regular preventive care can help to keep you healthy and well. Preventive care includes getting regular testing and making lifestyle changes as recommended by your health care provider. Talk with your health care provider about:  Which screenings and tests you should have. A screening is a test that checks for a disease when you have no symptoms.  A diet and exercise plan that is right for you. What should I know about screenings and tests to prevent falls? Screening and testing are the best ways to find a health problem early. Early diagnosis and treatment give you the best chance of managing medical conditions that are common after age 43. Certain conditions and lifestyle choices may make you more likely to have a fall. Your health care provider may recommend:  Regular vision checks. Poor vision and conditions such as cataracts can make you more likely to have a fall. If you wear glasses, make sure to get your prescription updated if your vision changes.  Medicine review. Work with your health care provider to regularly review all of the medicines you are taking, including over-the-counter medicines. Ask your health care provider about any side effects that may make you more likely to have a fall. Tell your health care provider if any medicines that you take make you feel dizzy or sleepy.  Osteoporosis screening. Osteoporosis is a condition that causes the bones to get weaker. This can make the bones weak and cause them to break more easily.  Blood pressure screening. Blood pressure changes and medicines to control blood  pressure can make you feel dizzy.  Strength and balance checks. Your health care provider may recommend certain tests to check your strength and balance while standing, walking, or changing positions.  Foot health exam. Foot pain and numbness, as well as not wearing proper footwear, can make you more likely to have a fall.  Depression screening. You may be more likely to have a fall if you have a fear of falling, feel emotionally low, or feel unable to do activities that you used to do.  Alcohol use screening. Using too much alcohol can affect your balance and may make you more likely to have a fall. What actions can I take to lower my risk of falls? General instructions  Talk with your health care provider about your risks for falling. Tell your health care provider if: ? You fall. Be sure to tell your health care provider about all falls, even ones that seem minor. ? You feel dizzy, sleepy, or off-balance.  Take over-the-counter and prescription medicines only as told by your health care provider. These include any supplements.  Eat a healthy diet and maintain a healthy weight. A healthy diet includes low-fat dairy products, low-fat (lean) meats, and fiber from whole grains, beans, and lots of fruits and vegetables. Home safety  Remove any tripping hazards, such as rugs, cords, and clutter.  Install safety equipment such as grab bars in bathrooms and safety rails on stairs.  Keep rooms and walkways well-lit. Activity   Follow a regular exercise program to stay fit. This will help you maintain  your balance. Ask your health care provider what types of exercise are appropriate for you.  If you need a cane or walker, use it as recommended by your health care provider.  Wear supportive shoes that have nonskid soles. Lifestyle  Do not drink alcohol if your health care provider tells you not to drink.  If you drink alcohol, limit how much you have: ? 0-1 drink a day for  women. ? 0-2 drinks a day for men.  Be aware of how much alcohol is in your drink. In the [place] one drink equals one typical bottle of beer (12 oz), one-half glass of wine (5 oz), or one shot of hard liquor (1 oz).  Do not use any products that contain nicotine or tobacco, such as cigarettes and e-cigarettes. If you need help quitting, ask your health care provider. Summary  Having a healthy lifestyle and getting preventive care can help to protect your health and wellness after age 2.  Screening and testing are the best way to find a health problem early and help you avoid having a fall. Early diagnosis and treatment give you the best chance for managing medical conditions that are more common for people who are older than age 7.  Falls are a major cause of broken bones and head injuries in people who are older than age 108. Take precautions to prevent a fall at home.  Work with your health care provider to learn what changes you can make to improve your health and wellness and to prevent falls. This information is not intended to replace advice given to you by your health care provider. Make sure you discuss any questions you have with your health care provider. Document Revised: 01/28/2019 Document Reviewed: 08/20/2017 Elsevier Patient Education  2020 [employer].

## 2020-06-06 NOTE — Assessment & Plan Note (Signed)
Under good control on current regimen. Continue current regimen. Continue to monitor. Call with any concerns. Refills given. Labs drawn today.   

## 2020-06-06 NOTE — Assessment & Plan Note (Signed)
Under good control with A1c of 6.7 when she's been off meds for 2 months. Continue current regimen. Continue to monitor. Recheck 3 months.

## 2020-06-06 NOTE — Assessment & Plan Note (Signed)
Rechecking labs today. Await results. Treat as needed.  °

## 2020-06-07 LAB — CBC WITH DIFFERENTIAL/PLATELET
Basophils Absolute: 0.1 10*3/uL (ref 0.0–0.2)
Basos: 1 %
EOS (ABSOLUTE): 0.2 10*3/uL (ref 0.0–0.4)
Eos: 4 %
Hematocrit: 46.4 % (ref 34.0–46.6)
Hemoglobin: 15.6 g/dL (ref 11.1–15.9)
Immature Grans (Abs): 0 10*3/uL (ref 0.0–0.1)
Immature Granulocytes: 1 %
Lymphocytes Absolute: 1.1 10*3/uL (ref 0.7–3.1)
Lymphs: 20 %
MCH: 29.3 pg (ref 26.6–33.0)
MCHC: 33.6 g/dL (ref 31.5–35.7)
MCV: 87 fL (ref 79–97)
Monocytes Absolute: 0.5 10*3/uL (ref 0.1–0.9)
Monocytes: 9 %
Neutrophils Absolute: 3.7 10*3/uL (ref 1.4–7.0)
Neutrophils: 65 %
Platelets: 310 10*3/uL (ref 150–450)
RBC: 5.33 x10E6/uL — ABNORMAL HIGH (ref 3.77–5.28)
RDW: 13.7 % (ref 11.7–15.4)
WBC: 5.7 10*3/uL (ref 3.4–10.8)

## 2020-06-07 LAB — COMPREHENSIVE METABOLIC PANEL
ALT: 17 IU/L (ref 0–32)
AST: 21 IU/L (ref 0–40)
Albumin/Globulin Ratio: 2.1 (ref 1.2–2.2)
Albumin: 4.4 g/dL (ref 3.7–4.7)
Alkaline Phosphatase: 72 IU/L (ref 48–121)
BUN/Creatinine Ratio: 18 (ref 12–28)
BUN: 25 mg/dL (ref 8–27)
Bilirubin Total: 1 mg/dL (ref 0.0–1.2)
CO2: 23 mmol/L (ref 20–29)
Calcium: 10.2 mg/dL (ref 8.7–10.3)
Chloride: 100 mmol/L (ref 96–106)
Creatinine, Ser: 1.36 mg/dL — ABNORMAL HIGH (ref 0.57–1.00)
GFR calc Af Amer: 44 mL/min/{1.73_m2} — ABNORMAL LOW (ref >59)
GFR calc non Af Amer: 38 mL/min/{1.73_m2} — ABNORMAL LOW (ref >59)
Globulin, Total: 2.1 g/dL (ref 1.5–4.5)
Glucose: 157 mg/dL — ABNORMAL HIGH (ref 65–99)
Potassium: 4.4 mmol/L (ref 3.5–5.2)
Sodium: 139 mmol/L (ref 134–144)
Total Protein: 6.5 g/dL (ref 6.0–8.5)

## 2020-06-07 LAB — LIPID PANEL W/O CHOL/HDL RATIO
Cholesterol, Total: 199 mg/dL (ref 100–199)
HDL: 51 mg/dL (ref >39)
LDL Chol Calc (NIH): 125 mg/dL — ABNORMAL HIGH (ref 0–99)
Triglycerides: 128 mg/dL (ref 0–149)
VLDL Cholesterol Cal: 23 mg/dL (ref 5–40)

## 2020-06-07 LAB — TSH: TSH: 2.68 u[IU]/mL (ref 0.450–4.500)

## 2020-06-13 ENCOUNTER — Encounter: Payer: Self-pay | Admitting: Family Medicine

## 2020-06-16 ENCOUNTER — Other Ambulatory Visit: Payer: Self-pay

## 2020-06-16 MED ORDER — METOPROLOL SUCCINATE ER 50 MG PO TB24: 50.0000 mg | ORAL_TABLET | Freq: Every day | ORAL | 0 refills | Status: DC

## 2020-06-19 ENCOUNTER — Other Ambulatory Visit: Payer: Self-pay

## 2020-06-19 MED ORDER — METOPROLOL SUCCINATE ER 50 MG PO TB24: 50.0000 mg | ORAL_TABLET | Freq: Every day | ORAL | 0 refills | Status: DC

## 2020-06-23 ENCOUNTER — Telehealth: Payer: Self-pay | Admitting: Family Medicine

## 2020-06-23 NOTE — Telephone Encounter (Signed)
Copied from [place] [id number]. Topic: General - Other >> Jun 23, 2020  3:07 PM Alanda Slim E wrote: Reason for CRM: Pt called to get her lab results from her CPE/ please advise

## 2020-06-23 NOTE — Telephone Encounter (Signed)
Called and gave lab report to patient per result note and letter to patient. Patient states she did not receive latter that Dr. Wynetta Emery sent so I am mailing her another copy.

## 2020-07-03 ENCOUNTER — Other Ambulatory Visit: Payer: Self-pay

## 2020-07-03 ENCOUNTER — Ambulatory Visit: Payer: Medicare PPO | Admitting: Cardiology

## 2020-07-03 ENCOUNTER — Encounter: Payer: Self-pay | Admitting: Cardiology

## 2020-07-03 VITALS — BP 120/74 | HR 94 | Ht 61.5 in | Wt 234.5 lb

## 2020-07-03 DIAGNOSIS — I4819 Other persistent atrial fibrillation: Secondary | ICD-10-CM

## 2020-07-03 DIAGNOSIS — I5189 Other ill-defined heart diseases: Secondary | ICD-10-CM

## 2020-07-03 DIAGNOSIS — I10 Essential (primary) hypertension: Secondary | ICD-10-CM | POA: Diagnosis not present

## 2020-07-03 NOTE — Patient Instructions (Signed)
Medication Instructions:  Your physician recommends that you continue on your current medications as directed. Please refer to the Current Medication list given to you today.  *If you need a refill on your cardiac medications before your next appointment, please call your pharmacy*   Lab Work: None Ordered If you have labs (blood work) drawn today and your tests are completely normal, you will receive your results only by: . MyChart Message (if you have MyChart) OR . A paper copy in the mail If you have any lab test that is abnormal or we need to change your treatment, we will call you to review the results.   Testing/Procedures: None Ordered   Follow-Up: At [facility], you and your health needs are our priority.  As part of our continuing mission to provide you with exceptional heart care, we have created designated Provider Care Teams.  These Care Teams include your primary Cardiologist (physician) and Advanced Practice Providers (APPs -  Physician Assistants and Nurse Practitioners) who all work together to provide you with the care you need, when you need it.  We recommend signing up for the patient portal called "MyChart".  Sign up information is provided on this After Visit Summary.  MyChart is used to connect with patients for Virtual Visits (Telemedicine).  Patients are able to view lab/test results, encounter notes, upcoming appointments, etc.  Non-urgent messages can be sent to your provider as well.   To learn more about what you can do with MyChart, go to [email or web address].    Your next appointment:   6 month(s)  The format for your next appointment:   In Person  Provider:   Brian Agbor-Etang, MD   Other Instructions   

## 2020-07-03 NOTE — Progress Notes (Signed)
Cardiology Office Note:    Date:  07/03/2020   ID:  Karen Farley, DOB [birth year], MRN [id number]  PCP:  Valerie Roys, DO  Cardiologist:  Kate Sable, MD  Electrophysiologist:  None   Referring MD: Valerie Roys, DO   Chief Complaint  Patient presents with  . OTHER    3 month f/u no complaints today. Meds reviewed verbally with pt.    History of Present Illness:    Karen Farley is a 74 y.o. female with a hx of CKD stage III, heart failure preserved ejection fraction, hypertension, persistent A. fib, hyperlipidemia, who presents for follow-up.  Patient was monitored with diuresing causing increase in creatinine.  She has since followed up with nephrology and torsemide currently dosed at 20 mg every other day.  Her shortness of breath is improved, still has edema but much more stable.  Lower extremity blisters also improved.  Denies palpitations.  Takes Toprol and Xarelto as prescribed.   Historical notes Patient saw PCP on November 16, 2019 and EKG noted to be in atrial fibrillation.  She was started on Toprol-XL and Xarelto. She states having a long history of lower extremity swelling for years.  Denies chest pain but notes some shortness of breath with exertion.  Endorses palpitations and irregular heart rate.  Marland Kitchen  TTEcho 11/2019 showed normal systolic function with ejection fraction of EF 60 to 65%, moderately dilated LA, grade 2 diastolic dysfunction.    Past Medical History:  Diagnosis Date  . 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 DDD (degenerative disc disease), lumbar   . Degenerative disc disease, lumbar    bulging and dengerated  . Diabetes mellitus without complication ([place])   . Hyperlipidemia   . Hypertension   . Osteoarthritis of both knees   . Osteopenia     Past Surgical History:  Procedure Laterality Date  . CHOLECYSTECTOMY    . DILATION AND CURETTAGE OF UTERUS    . TEAR DUCT PROBING WITH STRABISMUS REPAIR Right   .  TONSILLECTOMY      Current Medications: Current Meds  Medication Sig  . benazepril (LOTENSIN) 20 MG tablet Take 1 tablet (20 mg total) by mouth daily.  Regino Schultze Bandages & Supports (MEDICAL COMPRESSION STOCKINGS) MISC by Does not apply route.  . latanoprost (XALATAN) 0.005 % ophthalmic solution 1 drop at bedtime.  . metFORMIN (GLUCOPHAGE-XR) 500 MG 24 hr tablet Take 1 tablet (500 mg total) by mouth in the morning and at bedtime.  . metoprolol succinate (TOPROL-XL) 50 MG 24 hr tablet Take 1 tablet (50 mg total) by mouth daily. Take with or immediately following a meal.  . Multiple Vitamin (MULTIVITAMIN WITH MINERALS) TABS tablet Take 1 tablet by mouth daily.  Glory Rosebush ULTRA test strip USE TO TEST EVERY DAY AS DIRECTED  . rivaroxaban (XARELTO) 20 MG TABS tablet TAKE 1 TABLET(20 MG) BY MOUTH DAILY WITH SUPPER  . torsemide (DEMADEX) 20 MG tablet Take 20 mg by mouth. One tablet every other day  . traMADol (ULTRAM) 50 MG tablet Take 1-2 tablets (50-100 mg total) by mouth every 6 (six) hours as needed.  . Vitamin D, Cholecalciferol, 50 MCG (2000 UT) CAPS Take 1 tablet by mouth.     Allergies:   Patient has no known allergies.   Social History   Socioeconomic History  . Marital status: Married    Spouse name: Not on file  . Number of children: Not on file  .  Years of education: Not on file  . Highest education level: Not on file  Occupational History  . Not on file  Tobacco Use  . Smoking status: Never Smoker  . Smokeless tobacco: Never Used  Vaping Use  . Vaping Use: Never used  Substance and Sexual Activity  . Alcohol use: Not Currently    Alcohol/week: 0.0 standard drinks    Comment: on rare occasion  . Drug use: No  . Sexual activity: Not Currently  Other Topics Concern  . Not on file  Social History Narrative  . Not on file   Social Determinants of Health   Financial Resource Strain: Low Risk   . Difficulty of Paying Living Expenses: Not hard at all  Food  Insecurity: No Food Insecurity  . Worried About Charity fundraiser in the Last Year: Never true  . Ran Out of Food in the Last Year: Never true  Transportation Needs: No Transportation Needs  . Lack of Transportation (Medical): No  . Lack of Transportation (Non-Medical): No  Physical Activity: Inactive  . Days of Exercise per Week: 0 days  . Minutes of Exercise per Session: 0 min  Stress: No Stress Concern Present  . Feeling of Stress : Not at all  Social Connections:   . Frequency of Communication with Friends and Family: Not on file  . Frequency of Social Gatherings with Friends and Family: Not on file  . Attends Religious Services: Not on file  . Active Member of Clubs or Organizations: Not on file  . Attends Archivist Meetings: Not on file  . Marital Status: Not on file     Family History: The patient's family history includes Breast cancer in her maternal grandmother and mother; Cancer in her brother; Heart disease in her brother and mother; Heart disease (age of onset: 36) in her father; Osteoporosis in her mother; Parkinson's disease in her brother.  ROS:   Please see the history of present illness.     All other systems reviewed and are negative.  EKGs/Labs/Other Studies Reviewed:    The following studies were reviewed today:   EKG:  EKG is  ordered today.  The ekg ordered today demonstrates atrial fibrillation, heart rate 94.  Recent Labs: 06/06/2020: ALT 17; BUN 25; Creatinine, Ser 1.36; Hemoglobin 15.6; Platelets 310; Potassium 4.4; Sodium 139; TSH 2.680  Recent Lipid Panel    Component Value Date/Time   CHOL 199 06/06/2020 1340   CHOL 211 (H) 06/28/2016 1317   TRIG 128 06/06/2020 1340   TRIG 204 (H) 06/28/2016 1317   HDL 51 06/06/2020 1340   VLDL 41 (H) 06/28/2016 1317   LDLCALC 125 (H) 06/06/2020 1340    Physical Exam:    VS:  BP 120/74 (BP Location: Left Arm, Patient Position: Sitting, Cuff Size: Large)   Pulse 94   Ht 5' 1.5" (1.562 m)    Wt 234 lb 8 oz (106.4 kg)   SpO2 96%   BMI 43.59 kg/m     Wt Readings from Last 3 Encounters:  07/03/20 234 lb 8 oz (106.4 kg)  06/06/20 233 lb (105.7 kg)  05/22/20 243 lb (110.2 kg)     GEN:  Well nourished, well developed in no acute distress HEENT: Normal NECK: No JVD; No carotid bruits LYMPHATICS: No lymphadenopathy CARDIAC: Irregular irregular, no murmurs, rubs, gallops RESPIRATORY:  Clear to auscultation without rales, wheezing or rhonchi  ABDOMEN: Soft, non-tender, non-distended MUSCULOSKELETAL: 1+ edema; No deformity  SKIN: Warm and dry NEUROLOGIC:  Alert and oriented x 3 PSYCHIATRIC:  Normal affect   ASSESSMENT:    1. Persistent atrial fibrillation ([place])   2. Diastolic dysfunction   3. Essential hypertension   4. Morbid obesity ([place])    PLAN:      1. Patient with persistent atrial fibrillation.  CHA2DS2-VASc of 4.  Continue Toprol-XL to 50 mg daily, continue Xarelto 20 mg daily.  Echo with normal EF, moderately dilated left atrium. 2. Patient has lower extremity edema.  Grade 2 diastolic function.  Renal function stable.  Continue torsemide 20 mg every other day.  Additional diuresing as per nephrology depending on renal function. 3. History of hypertension, blood pressure controlled.  Continue Benicar, Toprol-XL 4. Patient is morbidly obese, low-calorie diet recommended.  Follow-up 6 months  Total encounter time 40 minutes  Greater than 50% was spent in counseling and coordination of care with the patient    This note was generated in part or whole with voice recognition software. Voice recognition is usually quite accurate but there are transcription errors that can and very often do occur. I apologize for any typographical errors that were not detected and corrected.  Medication Adjustments/Labs and Tests Ordered: Current medicines are reviewed at length with the patient today.  Concerns regarding medicines are outlined above.  Orders Placed This  Encounter  Procedures  . EKG 12-Lead   No orders of the defined types were placed in this encounter.   Patient Instructions  Medication Instructions:  Your physician recommends that you continue on your current medications as directed. Please refer to the Current Medication list given to you today.  *If you need a refill on your cardiac medications before your next appointment, please call your pharmacy*   Lab Work: None Ordered If you have labs (blood work) drawn today and your tests are completely normal, you will receive your results only by: Marland Kitchen MyChart Message (if you have MyChart) OR . A paper copy in the mail If you have any lab test that is abnormal or we need to change your treatment, we will call you to review the results.   Testing/Procedures: None Ordered   Follow-Up: At [facility], you and your health needs are our priority.  As part of our continuing mission to provide you with exceptional heart care, we have created designated Provider Care Teams.  These Care Teams include your primary Cardiologist (physician) and Advanced Practice Providers (APPs -  Physician Assistants and Nurse Practitioners) who all work together to provide you with the care you need, when you need it.  We recommend signing up for the patient portal called "MyChart".  Sign up information is provided on this After Visit Summary.  MyChart is used to connect with patients for Virtual Visits (Telemedicine).  Patients are able to view lab/test results, encounter notes, upcoming appointments, etc.  Non-urgent messages can be sent to your provider as well.   To learn more about what you can do with MyChart, go to [email or web address].    Your next appointment:   6 month(s)  The format for your next appointment:   In Person  Provider:   Kate Sable, MD   Other Instructions      Signed, Kate Sable, MD  07/03/2020 5:27 PM    [place]

## 2020-07-04 ENCOUNTER — Other Ambulatory Visit: Payer: Self-pay | Admitting: Family Medicine

## 2020-07-04 DIAGNOSIS — Z1231 Encounter for screening mammogram for malignant neoplasm of breast: Secondary | ICD-10-CM

## 2020-07-05 ENCOUNTER — Ambulatory Visit
Admission: RE | Admit: 2020-07-05 | Discharge: 2020-07-05 | Disposition: A | Discharge status: Home / Self Care | Payer: Medicare PPO | Source: Ambulatory Visit | Attending: Family Medicine | Admitting: Family Medicine

## 2020-07-05 DIAGNOSIS — Z1231 Encounter for screening mammogram for malignant neoplasm of breast: Secondary | ICD-10-CM | POA: Diagnosis not present

## 2020-07-07 ENCOUNTER — Encounter: Payer: Self-pay | Admitting: Family Medicine

## 2020-07-26 DIAGNOSIS — I890 Lymphedema, not elsewhere classified: Secondary | ICD-10-CM | POA: Diagnosis not present

## 2020-07-26 DIAGNOSIS — N1832 Chronic kidney disease, stage 3b: Secondary | ICD-10-CM | POA: Diagnosis not present

## 2020-07-26 DIAGNOSIS — I10 Essential (primary) hypertension: Secondary | ICD-10-CM | POA: Diagnosis not present

## 2020-07-26 DIAGNOSIS — E1122 Type 2 diabetes mellitus with diabetic chronic kidney disease: Secondary | ICD-10-CM | POA: Diagnosis not present

## 2020-08-09 ENCOUNTER — Telehealth: Payer: Self-pay

## 2020-08-09 NOTE — Telephone Encounter (Signed)
Copied from [place] [id number]. Topic: General - Other >> Aug 09, 2020  3:29 PM Leward Quan A wrote: Reason for CRM: Patient called in to inform Dr Wynetta Emery that her husband went to the pharmacy to get her Rx for benazepril (LOTENSIN) 20 MG tablet he was told that it will be next week before she can have a refill. Per patient she will be out by Sunday. Please advise

## 2020-08-09 NOTE — Telephone Encounter (Signed)
Please Advise.  KP

## 2020-08-09 NOTE — Telephone Encounter (Signed)
Patient has refills. Please call the pharmacy and see if they can release her Rx- did patient lose some of her pills?

## 2020-08-09 NOTE — Telephone Encounter (Signed)
Called pt she stated that the pharmacy will not fill her medication.  KP

## 2020-08-11 NOTE — Telephone Encounter (Signed)
Called pharmacy to see what ws going on with the patient's refill. Pharmactystates that it was refilled on 08/09/20 and it ready for pick up. Called and let patient know all of this.

## 2020-08-14 ENCOUNTER — Encounter (INDEPENDENT_AMBULATORY_CARE_PROVIDER_SITE_OTHER): Payer: Self-pay | Admitting: Vascular Surgery

## 2020-08-14 ENCOUNTER — Other Ambulatory Visit: Payer: Self-pay

## 2020-08-14 ENCOUNTER — Ambulatory Visit (INDEPENDENT_AMBULATORY_CARE_PROVIDER_SITE_OTHER): Payer: Medicare PPO | Admitting: Vascular Surgery

## 2020-08-14 VITALS — BP 132/82 | HR 89 | Ht 62.0 in | Wt 223.0 lb

## 2020-08-14 DIAGNOSIS — N182 Chronic kidney disease, stage 2 (mild): Secondary | ICD-10-CM | POA: Diagnosis not present

## 2020-08-14 DIAGNOSIS — E1122 Type 2 diabetes mellitus with diabetic chronic kidney disease: Secondary | ICD-10-CM

## 2020-08-14 DIAGNOSIS — N183 Chronic kidney disease, stage 3 unspecified: Secondary | ICD-10-CM

## 2020-08-14 DIAGNOSIS — M5416 Radiculopathy, lumbar region: Secondary | ICD-10-CM

## 2020-08-14 DIAGNOSIS — I872 Venous insufficiency (chronic) (peripheral): Secondary | ICD-10-CM | POA: Diagnosis not present

## 2020-08-14 DIAGNOSIS — I890 Lymphedema, not elsewhere classified: Secondary | ICD-10-CM | POA: Diagnosis not present

## 2020-08-14 DIAGNOSIS — I739 Peripheral vascular disease, unspecified: Secondary | ICD-10-CM

## 2020-08-14 NOTE — Progress Notes (Signed)
MRN : [id number]  Karen Farley is a 74 y.o. ([birth year]) female who presents with chief complaint of leg swelling.  History of Present Illness:   Patient is seen for evaluation of leg pain and leg swelling. The patient first noticed the swelling remotely. The swelling is associated with pain and discoloration. The pain and swelling worsens with prolonged dependency and improves with elevation. The pain is unrelated to activity.  The patient notes that in the morning the legs are significantly improved but they steadily worsened throughout the course of the day. The patient also notes a steady worsening of the discoloration in the ankle and shin area.   She was diagnosed with atrial fibrillation fairly recently and during the course of treatment for her atrial fibrillation as a result of her diuretics she was found to have significant renal dysfunction.  This has improved now that her diuretic regime has been modified.  The patient denies claudication symptoms.  The patient denies symptoms consistent with rest pain.  The patient denies and extensive history of DJD and LS spine disease.  The patient has no had any past angiography, interventions or vascular surgery.  Elevation makes the leg symptoms better, dependency makes them much worse. There is no history of ulcerations. The patient denies any recent changes in medications.  The patient has not been wearing graduated compression.  The patient denies a history of DVT or PE. There is no prior history of phlebitis. There is no history of primary lymphedema.  No history of malignancies. No history of trauma or groin or pelvic surgery. There is no history of radiation treatment to the groin or pelvis  The patient denies amaurosis fugax or recent TIA symptoms. There are no recent neurological changes noted. The patient denies recent episodes of angina or shortness of breath  No outpatient medications have been marked as taking for the  08/14/20 encounter (Appointment) with Delana Meyer, Dolores Lory, MD.    Past Medical History:  Diagnosis Date  . 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 DDD (degenerative disc disease), lumbar   . Degenerative disc disease, lumbar    bulging and dengerated  . Diabetes mellitus without complication ([place])   . Hyperlipidemia   . Hypertension   . Osteoarthritis of both knees   . Osteopenia     Past Surgical History:  Procedure Laterality Date  . CHOLECYSTECTOMY    . DILATION AND CURETTAGE OF UTERUS    . TEAR DUCT PROBING WITH STRABISMUS REPAIR Right   . TONSILLECTOMY      Social History Social History   Tobacco Use  . Smoking status: Never Smoker  . Smokeless tobacco: Never Used  Vaping Use  . Vaping Use: Never used  Substance Use Topics  . Alcohol use: Not Currently    Alcohol/week: 0.0 standard drinks    Comment: on rare occasion  . Drug use: No    Family History Family History  Problem Relation Age of Onset  . Heart disease Mother        CHF  . Osteoporosis Mother   . Breast cancer Mother   . Heart disease Father 88  . Cancer Brother        Colon CA- 2002- Youngest brother  . Parkinson's disease Brother        Younger Brother  . Heart disease Brother        2 brothers  . Breast cancer Maternal Grandmother   No family history  of bleeding/clotting disorders, porphyria or autoimmune disease   No Known Allergies   REVIEW OF SYSTEMS (Negative unless checked)  Constitutional: [] Weight loss  [] Fever  [] Chills Cardiac: [] Chest pain   [] Chest pressure   [x] Palpitations   [] Shortness of breath when laying flat   [] Shortness of breath with exertion. Vascular:  [] Pain in legs with walking   [x] Pain in legs at rest  [] History of DVT   [] Phlebitis   [x] Swelling in legs   [] Varicose veins   [] Non-healing ulcers Pulmonary:   [] Uses home oxygen   [] Productive cough   [] Hemoptysis   [] Wheeze  [] COPD   [] Asthma Neurologic:  [] Dizziness   [] Seizures   [] History of  stroke   [] History of TIA  [] Aphasia   [] Vissual changes   [] Weakness or numbness in arm   [] Weakness or numbness in leg Musculoskeletal:   [] Joint swelling   [] Joint pain   [] Low back pain Hematologic:  [] Easy bruising  [] Easy bleeding   [] Hypercoagulable state   [] Anemic Gastrointestinal:  [] Diarrhea   [] Vomiting  [] Gastroesophageal reflux/heartburn   [] Difficulty swallowing. Genitourinary:  [] Chronic kidney disease   [] Difficult urination  [] Frequent urination   [] Blood in urine Skin:  [] Rashes   [] Ulcers  Psychological:  [] History of anxiety   []  History of major depression.  Physical Examination  There were no vitals filed for this visit. There is no height or weight on file to calculate BMI. Gen: WD/WN, NAD Head: [place]/AT, No temporalis wasting.  Ear/Nose/Throat: Hearing grossly intact, nares w/o erythema or drainage, poor dentition Eyes: PER, EOMI, sclera nonicteric.  Neck: Supple, no masses.  No bruit or JVD.  Pulmonary:  Good air movement, clear to auscultation bilaterally, no use of accessory muscles.  Cardiac: RRR, normal S1, S2, no Murmurs. Vascular: scattered varicosities present bilaterally.  Moderate to severe venous stasis changes to the legs bilaterally.  3-4+ firm pitting edema extending to the dorsum of the feet Vessel Right Left  Radial Palpable Palpable  PT Not Palpable Not Palpable  DP Not Palpable Not Palpable  Gastrointestinal: soft, non-distended. No guarding/no peritoneal signs.  Musculoskeletal: M/S 5/5 throughout.  No deformity or atrophy.  Neurologic: CN 2-12 intact. Pain and light touch intact in extremities.  Symmetrical.  Speech is fluent. Motor exam as listed above. Psychiatric: Judgment intact, Mood & affect appropriate for pt's clinical situation. Dermatologic: Moderate to severe venous rashes no ulcers noted.  No changes consistent with cellulitis. Lymph : + lichenification and skin changes of chronic lymphedema.  CBC Lab Results  Component Value Date    WBC 5.7 06/06/2020   HGB 15.6 06/06/2020   HCT 46.4 06/06/2020   MCV 87 06/06/2020   PLT 310 06/06/2020    BMET    Component Value Date/Time   NA 139 06/06/2020 1340   K 4.4 06/06/2020 1340   CL 100 06/06/2020 1340   CO2 23 06/06/2020 1340   GLUCOSE 157 (H) 06/06/2020 1340   GLUCOSE 165 (H) 03/21/2020 1615   BUN 25 06/06/2020 1340   CREATININE 1.36 (H) 06/06/2020 1340   CALCIUM 10.2 06/06/2020 1340   GFRNONAA 38 (L) 06/06/2020 1340   GFRAA 44 (L) 06/06/2020 1340   CrCl cannot be calculated (Patient's most recent lab result is older than the maximum 21 days allowed.).  COAG No results found for: INR, PROTIME  Radiology No results found.    Assessment/Plan 1. Lymphedema I have had a long discussion with the patient regarding swelling and why it  causes symptoms.  Patient will  begin wearing graduated compression stockings class 1 (20-30 mmHg) on a daily basis a prescription was given. The patient will  beginning wearing the stockings first thing in the morning and removing them in the evening. The patient is instructed specifically not to sleep in the stockings.   In addition, behavioral modification will be initiated.  This will include frequent elevation, use of over the counter pain medications and exercise such as walking.  I have reviewed systemic causes for chronic edema such as liver, kidney and cardiac etiologies.  The patient denies problems with these organ systems.    Consideration for a lymph pump will also be made based upon the effectiveness of conservative therapy.  This would help to improve the edema control and prevent sequela such as ulcers and infections   Patient should undergo duplex ultrasound of the venous system to ensure that DVT or reflux is not present.  The patient will follow-up with me after the ultrasound.   - VAS [place] LOWER EXTREMITY VENOUS REFLUX; Future  2. Chronic venous insufficiency I have had a long discussion with the patient  regarding swelling and why it  causes symptoms.  Patient will begin wearing graduated compression stockings class 1 (20-30 mmHg) on a daily basis a prescription was given. The patient will  beginning wearing the stockings first thing in the morning and removing them in the evening. The patient is instructed specifically not to sleep in the stockings.   In addition, behavioral modification will be initiated.  This will include frequent elevation, use of over the counter pain medications and exercise such as walking.  I have reviewed systemic causes for chronic edema such as liver, kidney and cardiac etiologies.  The patient denies problems with these organ systems.    Consideration for a lymph pump will also be made based upon the effectiveness of conservative therapy.  This would help to improve the edema control and prevent sequela such as ulcers and infections   Patient should undergo duplex ultrasound of the venous system to ensure that DVT or reflux is not present.  The patient will follow-up with me after the ultrasound.   - VAS [place] LOWER EXTREMITY VENOUS REFLUX; Future  3. PAD (peripheral artery disease) ([place]) Patient is describing leg pain and I am unable to palpate pedal pulses.  Given the amount of edema and the hardness of the tissues this may be more a indication of her venous disease and lymphatic disease that it is of peripheral arterial disease.  Nevertheless, given the inability to palpate pulses I will obtain ABIs.  - VAS [place] ABI WITH/WO TBI; Future  4. 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 diabetes mellitus ([place]) At the present time the patient does not require dialysis access.  Continue diuretics as ordered.  Avoid nephrotoxic medications and dehydration.  Further plans per cardiology/nephrology  5. Type 2 diabetes mellitus with stage 2 chronic kidney disease, without long-term current use of insulin ([facility]) Continue hypoglycemic medications as already ordered, these medications have  been reviewed and there are no changes at this time.  Hgb A1C to be monitored as already arranged by primary service   6. Lumbar radiculopathy Continue NSAID medications as already ordered, these medications have been reviewed and there are no changes at this time.  Continued activity and therapy was stressed.     Hortencia Pilar, MD  08/14/2020 8:32 AM

## 2020-09-08 ENCOUNTER — Ambulatory Visit: Payer: Medicare PPO | Admitting: Family Medicine

## 2020-09-22 ENCOUNTER — Telehealth: Payer: Self-pay | Admitting: Cardiology

## 2020-09-22 NOTE — Telephone Encounter (Signed)
Patient given refill for metoprolol 25 mg po q d .  She wants to clarify this because previous rx 50 mg po q d

## 2020-09-22 NOTE — Telephone Encounter (Signed)
Spoke with patient and advised her that she is to be taking 50mg  of metoprolol daily. Pharmacy may have filled an old prescription that was not deleted from their system. Patient states she will take (2) 25mg  tablets daily and when this supply runs out she will make sure the pharmacy fills the correct dosage before she leaves next time.

## 2020-10-09 ENCOUNTER — Ambulatory Visit: Payer: Medicare PPO | Admitting: Family Medicine

## 2020-10-09 ENCOUNTER — Other Ambulatory Visit: Payer: Self-pay

## 2020-10-09 ENCOUNTER — Encounter: Payer: Self-pay | Admitting: Family Medicine

## 2020-10-09 VITALS — BP 119/77 | HR 65 | Temp 97.6°F | Wt 237.0 lb

## 2020-10-09 DIAGNOSIS — N182 Chronic kidney disease, stage 2 (mild): Secondary | ICD-10-CM

## 2020-10-09 DIAGNOSIS — R609 Edema, unspecified: Secondary | ICD-10-CM | POA: Diagnosis not present

## 2020-10-09 DIAGNOSIS — E1122 Type 2 diabetes mellitus with diabetic chronic kidney disease: Secondary | ICD-10-CM

## 2020-10-09 LAB — BAYER DCA HB A1C WAIVED: HB A1C (BAYER DCA - WAIVED): 7.2 % — ABNORMAL HIGH (ref <7.0)

## 2020-10-09 MED ORDER — METOPROLOL SUCCINATE ER 50 MG PO TB24: 50.0000 mg | ORAL_TABLET | Freq: Every day | ORAL | 1 refills | Status: DC

## 2020-10-09 MED ORDER — BENAZEPRIL HCL 20 MG PO TABS: 20.0000 mg | ORAL_TABLET | Freq: Every day | ORAL | 0 refills | Status: DC

## 2020-10-09 MED ORDER — TORSEMIDE 20 MG PO TABS: 20.0000 mg | ORAL_TABLET | Freq: Every day | ORAL | 1 refills | Status: DC

## 2020-10-09 MED ORDER — METFORMIN HCL ER 500 MG PO TB24: ORAL_TABLET | ORAL | 0 refills | Status: DC

## 2020-10-09 NOTE — Assessment & Plan Note (Addendum)
Doing well with A1c of 7.2. Continue diet and exercise. Will increase her metformin to 1500mg  daily. Call with any concerns. Recheck 3 months.

## 2020-10-09 NOTE — Progress Notes (Signed)
BP 119/77   Pulse 65   Temp 97.6 F (36.4 C)   Wt 237 lb (107.5 kg)   SpO2 97%   BMI 43.35 kg/m    Subjective:    Patient ID: Karen Farley, female    DOB: 1946-05-10, 74 y.o.   MRN: [id number]  HPI: [name] is a 74 y.o. female  Chief Complaint  Patient presents with  . Diabetes   DIABETES- has been running high for about the past 2-3 months, now has been back and forth Hypoglycemic episodes:no Polydipsia/polyuria: no Visual disturbance: no Chest pain: no Paresthesias: no Glucose Monitoring: yes  Accucheck frequency: Daily  Fasting glucose: "high" 120-133, now 140s-200s Taking Insulin?: no Blood Pressure Monitoring: not checking Retinal Examination: Up to Date Foot Exam: Up to Date Diabetic Education: Completed Pneumovax: Up to Date Influenza: Up to Date Aspirin: yes  Relevant past medical, surgical, family and social history reviewed and updated as indicated. Interim medical history since our last visit reviewed. Allergies and medications reviewed and updated.  Review of Systems  Constitutional: Negative.   Respiratory: Negative.   Cardiovascular: Negative.   Gastrointestinal: Negative.   Musculoskeletal: Negative.   Neurological: Negative.   Psychiatric/Behavioral: Negative.     Per HPI unless specifically indicated above     Objective:    BP 119/77   Pulse 65   Temp 97.6 F (36.4 C)   Wt 237 lb (107.5 kg)   SpO2 97%   BMI 43.35 kg/m   Wt Readings from Last 3 Encounters:  10/09/20 237 lb (107.5 kg)  08/14/20 223 lb (101.2 kg)  07/03/20 234 lb 8 oz (106.4 kg)    Physical Exam Vitals and nursing note reviewed.  Constitutional:      General: She is not in acute distress.    Appearance: Normal appearance. She is not ill-appearing, toxic-appearing or diaphoretic.  HENT:     Head: Normocephalic and atraumatic.     Right Ear: External ear normal.     Left Ear: External ear normal.     Nose: Nose normal.     Mouth/Throat:      Mouth: Mucous membranes are moist.     Pharynx: Oropharynx is clear.  Eyes:     General: No scleral icterus.       Right eye: No discharge.        Left eye: No discharge.     Extraocular Movements: Extraocular movements intact.     Conjunctiva/sclera: Conjunctivae normal.     Pupils: Pupils are equal, round, and reactive to light.  Cardiovascular:     Rate and Rhythm: Normal rate and regular rhythm.     Pulses: Normal pulses.     Heart sounds: Normal heart sounds. No murmur heard. No friction rub. No gallop.   Pulmonary:     Effort: Pulmonary effort is normal. No respiratory distress.     Breath sounds: Normal breath sounds. No stridor. No wheezing, rhonchi or rales.  Chest:     Chest wall: No tenderness.  Musculoskeletal:        General: Normal range of motion.     Cervical back: Normal range of motion and neck supple.     Right lower leg: Edema present.     Left lower leg: Edema present.  Skin:    General: Skin is warm and dry.     Capillary Refill: Capillary refill takes less than 2 seconds.     Coloration: Skin is not jaundiced or pale.  Findings: No bruising, erythema, lesion or rash.  Neurological:     General: No focal deficit present.     Mental Status: She is alert and oriented to person, place, and time. Mental status is at baseline.  Psychiatric:        Mood and Affect: Mood normal.        Behavior: Behavior normal.        Thought Content: Thought content normal.        Judgment: Judgment normal.     Results for orders placed or performed in visit on 06/06/20  Microscopic Examination   Urine  Result Value Ref Range   WBC, UA 6-10 (A) 0 - 5 /hpf   RBC None seen 0 - 2 /hpf   Epithelial Cells (non renal) 0-10 0 - 10 /hpf   Bacteria, UA None seen None seen/Few  Bayer DCA Hb A1c Waived  Result Value Ref Range   HB A1C (BAYER DCA - WAIVED) 6.7 <7.0 %  CBC with Differential/Platelet  Result Value Ref Range   WBC 5.7 3.4 - 10.8 x10E3/uL   RBC 5.33 (H) 3.77  - 5.28 x10E6/uL   Hemoglobin 15.6 11.1 - 15.9 g/dL   Hematocrit 46.4 34.0 - 46.6 %   MCV 87 79 - 97 fL   MCH 29.3 26.6 - 33.0 pg   MCHC 33.6 31.5 - 35.7 g/dL   RDW 13.7 11.7 - 15.4 %   Platelets 310 150 - 450 x10E3/uL   Neutrophils 65 Not Estab. %   Lymphs 20 Not Estab. %   Monocytes 9 Not Estab. %   Eos 4 Not Estab. %   Basos 1 Not Estab. %   Neutrophils Absolute 3.7 1.4 - 7.0 x10E3/uL   Lymphocytes Absolute 1.1 0.7 - 3.1 x10E3/uL   Monocytes Absolute 0.5 0.1 - 0.9 x10E3/uL   EOS (ABSOLUTE) 0.2 0.0 - 0.4 x10E3/uL   Basophils Absolute 0.1 0.0 - 0.2 x10E3/uL   Immature Granulocytes 1 Not Estab. %   Immature Grans (Abs) 0.0 0.0 - 0.1 x10E3/uL  Comprehensive metabolic panel  Result Value Ref Range   Glucose 157 (H) 65 - 99 mg/dL   BUN 25 8 - 27 mg/dL   Creatinine, Ser 1.36 (H) 0.57 - 1.00 mg/dL   GFR calc non Af Amer 38 (L) >59 mL/min/1.73   GFR calc Af Amer 44 (L) >59 mL/min/1.73   BUN/Creatinine Ratio 18 12 - 28   Sodium 139 134 - 144 mmol/L   Potassium 4.4 3.5 - 5.2 mmol/L   Chloride 100 96 - 106 mmol/L   CO2 23 20 - 29 mmol/L   Calcium 10.2 8.7 - 10.3 mg/dL   Total Protein 6.5 6.0 - 8.5 g/dL   Albumin 4.4 3.7 - 4.7 g/dL   Globulin, Total 2.1 1.5 - 4.5 g/dL   Albumin/Globulin Ratio 2.1 1.2 - 2.2   Bilirubin Total 1.0 0.0 - 1.2 mg/dL   Alkaline Phosphatase 72 48 - 121 IU/L   AST 21 0 - 40 IU/L   ALT 17 0 - 32 IU/L  Lipid Panel w/o Chol/HDL Ratio  Result Value Ref Range   Cholesterol, Total 199 100 - 199 mg/dL   Triglycerides 128 0 - 149 mg/dL   HDL 51 >39 mg/dL   VLDL Cholesterol Cal 23 5 - 40 mg/dL   LDL Chol Calc (NIH) 125 (H) 0 - 99 mg/dL  Microalbumin, Urine Waived  Result Value Ref Range   Microalb, Ur Waived 30 (H) 0 - 19 mg/L  Creatinine, Urine Waived 200 10 - 300 mg/dL   Microalb/Creat Ratio <30 <30 mg/g  TSH  Result Value Ref Range   TSH 2.680 0.450 - 4.500 uIU/mL  Urinalysis, Routine w reflex microscopic  Result Value Ref Range   Specific Gravity,  UA 1.020 1.005 - 1.030   pH, UA 5.0 5.0 - 7.5   Color, UA Yellow Yellow   Appearance Ur Clear Clear   Leukocytes,UA 1+ (A) Negative   Protein,UA Negative Negative/Trace   Glucose, UA Negative Negative   Ketones, UA Negative Negative   RBC, UA Negative Negative   Bilirubin, UA Negative Negative   Urobilinogen, Ur 0.2 0.2 - 1.0 mg/dL   Nitrite, UA Negative Negative   Microscopic Examination See below:       Assessment & Plan:   Problem List Items Addressed This Visit      Endocrine   Type 2 diabetes mellitus ([place]) - Primary    Doing well with A1c of 7.2. Continue diet and exercise. Will increase her metformin to 1500mg  daily. Call with any concerns. Recheck 3 months.       Relevant Medications   benazepril (LOTENSIN) 20 MG tablet   metFORMIN (GLUCOPHAGE-XR) 500 MG 24 hr tablet   Other Relevant Orders   Bayer DCA Hb A1c Waived    Other Visit Diagnoses    Peripheral edema       Can increase her torsemide to 2 a day as needed for 1-3 days if needed.        Follow up plan: Return in about 3 months (around 01/07/2021).

## 2020-11-01 ENCOUNTER — Other Ambulatory Visit: Payer: Self-pay | Admitting: Family Medicine

## 2020-11-02 ENCOUNTER — Other Ambulatory Visit: Payer: Self-pay | Admitting: *Deleted

## 2020-11-02 MED ORDER — TORSEMIDE 20 MG PO TABS: 20.0000 mg | ORAL_TABLET | Freq: Every day | ORAL | 0 refills | Status: DC

## 2020-11-13 ENCOUNTER — Ambulatory Visit (INDEPENDENT_AMBULATORY_CARE_PROVIDER_SITE_OTHER): Payer: Medicare PPO

## 2020-11-13 ENCOUNTER — Encounter (INDEPENDENT_AMBULATORY_CARE_PROVIDER_SITE_OTHER): Payer: Self-pay | Admitting: Vascular Surgery

## 2020-11-13 ENCOUNTER — Other Ambulatory Visit: Payer: Self-pay

## 2020-11-13 ENCOUNTER — Ambulatory Visit (INDEPENDENT_AMBULATORY_CARE_PROVIDER_SITE_OTHER): Payer: Medicare PPO | Admitting: Vascular Surgery

## 2020-11-13 VITALS — BP 136/85 | HR 75 | Ht 62.0 in | Wt 229.0 lb

## 2020-11-13 DIAGNOSIS — I482 Chronic atrial fibrillation, unspecified: Secondary | ICD-10-CM

## 2020-11-13 DIAGNOSIS — I890 Lymphedema, not elsewhere classified: Secondary | ICD-10-CM | POA: Diagnosis not present

## 2020-11-13 DIAGNOSIS — E782 Mixed hyperlipidemia: Secondary | ICD-10-CM

## 2020-11-13 DIAGNOSIS — I872 Venous insufficiency (chronic) (peripheral): Secondary | ICD-10-CM

## 2020-11-13 DIAGNOSIS — I739 Peripheral vascular disease, unspecified: Secondary | ICD-10-CM | POA: Diagnosis not present

## 2020-11-13 DIAGNOSIS — E1159 Type 2 diabetes mellitus with other circulatory complications: Secondary | ICD-10-CM

## 2020-11-13 NOTE — Progress Notes (Signed)
MRN : [id number]  Karen Farley is a 75 y.o. ([birth year]) female who presents with chief complaint of  Chief Complaint  Patient presents with  . Foot Ulcer  . Follow-up    U/S  .  History of Present Illness: The patient returns to the office for followup evaluation regarding leg swelling.  The swelling has persisted and the pain associated with swelling continues. There have not been any interval development of a ulcerations or wounds.  Since the previous visit the patient has been wearing graduated compression stockings and has noted little if any improvement in the lymphedema. The patient has been using compression routinely morning until night.  The patient also states elevation during the day and exercise is being done too.  Venous duplex demonstrates normal deep venous system.  No significant superficial reflux is identified.  Current Meds  Medication Sig  . benazepril (LOTENSIN) 20 MG tablet Take 1 tablet (20 mg total) by mouth daily.  Regino Schultze Bandages & Supports (MEDICAL COMPRESSION STOCKINGS) MISC by Does not apply route.  . latanoprost (XALATAN) 0.005 % ophthalmic solution 1 drop at bedtime.  . metFORMIN (GLUCOPHAGE-XR) 500 MG 24 hr tablet Take 2 tabs in the AM and 1 tab in the PM  . metoprolol succinate (TOPROL-XL) 50 MG 24 hr tablet Take 1 tablet (50 mg total) by mouth daily. Take with or immediately following a meal.  . Multiple Vitamin (MULTIVITAMIN WITH MINERALS) TABS tablet Take 1 tablet by mouth daily.  Glory Rosebush ULTRA test strip USE TO TEST EVERY DAY AS DIRECTED  . torsemide (DEMADEX) 20 MG tablet Take 1-2 tablets (20-40 mg total) by mouth daily. Only take 2 for 1-3 days and if persists call with bad swelling  . traMADol (ULTRAM) 50 MG tablet Take 1-2 tablets (50-100 mg total) by mouth every 6 (six) hours as needed.  . Vitamin D, Cholecalciferol, 50 MCG (2000 UT) CAPS Take 1 tablet by mouth.  Alveda Reasons 20 MG TABS tablet TAKE 1 TABLET(20 MG) BY MOUTH DAILY  WITH SUPPER    Past Medical History:  Diagnosis Date  . 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 DDD (degenerative disc disease), lumbar   . Degenerative disc disease, lumbar    bulging and dengerated  . Diabetes mellitus without complication ([place])   . Hyperlipidemia   . Hypertension   . Osteoarthritis of both knees   . Osteopenia     Past Surgical History:  Procedure Laterality Date  . CHOLECYSTECTOMY    . DILATION AND CURETTAGE OF UTERUS    . TEAR DUCT PROBING WITH STRABISMUS REPAIR Right   . TONSILLECTOMY      Social History Social History   Tobacco Use  . Smoking status: Never Smoker  . Smokeless tobacco: Never Used  Vaping Use  . Vaping Use: Never used  Substance Use Topics  . Alcohol use: Not Currently    Alcohol/week: 0.0 standard drinks    Comment: on rare occasion  . Drug use: No    Family History Family History  Problem Relation Age of Onset  . Heart disease Mother        CHF  . Osteoporosis Mother   . Breast cancer Mother   . Heart disease Father 78  . Cancer Brother        Colon CA- 2002- Youngest brother  . Parkinson's disease Brother        Younger Brother  . Heart disease Brother  2 brothers  . Breast cancer Maternal Grandmother     No Known Allergies   REVIEW OF SYSTEMS (Negative unless checked)  Constitutional: [] Weight loss  [] Fever  [] Chills Cardiac: [] Chest pain   [] Chest pressure   [] Palpitations   [] Shortness of breath when laying flat   [] Shortness of breath with exertion. Vascular:  [] Pain in legs with walking   [x] Pain in legs at rest  [] History of DVT   [] Phlebitis   [x] Swelling in legs   [] Varicose veins   [] Non-healing ulcers Pulmonary:   [] Uses home oxygen   [] Productive cough   [] Hemoptysis   [] Wheeze  [] COPD   [] Asthma Neurologic:  [] Dizziness   [] Seizures   [] History of stroke   [] History of TIA  [] Aphasia   [] Vissual changes   [] Weakness or numbness in arm   [] Weakness or numbness in  leg Musculoskeletal:   [] Joint swelling   [x] Joint pain   [] Low back pain Hematologic:  [] Easy bruising  [] Easy bleeding   [] Hypercoagulable state   [] Anemic Gastrointestinal:  [] Diarrhea   [] Vomiting  [] Gastroesophageal reflux/heartburn   [] Difficulty swallowing. Genitourinary:  [] Chronic kidney disease   [] Difficult urination  [] Frequent urination   [] Blood in urine Skin:  [] Rashes   [] Ulcers  Psychological:  [] History of anxiety   []  History of major depression.  Physical Examination  Vitals:   11/13/20 1545  BP: 136/85  Pulse: 75  Weight: 229 lb (103.9 kg)  Height: 5\' 2"  (1.575 m)   Body mass index is 41.88 kg/m. Gen: WD/WN, NAD Head: /AT, No temporalis wasting.  Ear/Nose/Throat: Hearing grossly intact, nares w/o erythema or drainage Eyes: PER, EOMI, sclera nonicteric.  Neck: Supple, no large masses.   Pulmonary:  Good air movement, no audible wheezing bilaterally, no use of accessory muscles.  Cardiac: RRR, no JVD Vascular: scattered varicosities present bilaterally.  Mild venous stasis changes to the legs bilaterally.  2+ soft pitting edema Vessel Right Left  Radial Palpable Palpable  Gastrointestinal: Non-distended. No guarding/no peritoneal signs.  Musculoskeletal: M/S 5/5 throughout.  No deformity or atrophy.  Neurologic: CN 2-12 intact. Symmetrical.  Speech is fluent. Motor exam as listed above. Psychiatric: Judgment intact, Mood & affect appropriate for pt's clinical situation. Dermatologic: venous rashes no ulcers noted.  No changes consistent with cellulitis. Lymph : + lichenification / skin changes of chronic lymphedema.  CBC Lab Results  Component Value Date   WBC 5.7 06/06/2020   HGB 15.6 06/06/2020   HCT 46.4 06/06/2020   MCV 87 06/06/2020   PLT 310 06/06/2020    BMET    Component Value Date/Time   NA 139 06/06/2020 1340   K 4.4 06/06/2020 1340   CL 100 06/06/2020 1340   CO2 23 06/06/2020 1340   GLUCOSE 157 (H) 06/06/2020 1340   GLUCOSE 165 (H)  03/21/2020 1615   BUN 25 06/06/2020 1340   CREATININE 1.36 (H) 06/06/2020 1340   CALCIUM 10.2 06/06/2020 1340   GFRNONAA 38 (L) 06/06/2020 1340   GFRAA 44 (L) 06/06/2020 1340   CrCl cannot be calculated (Patient's most recent lab result is older than the maximum 21 days allowed.).  COAG No results found for: INR, PROTIME  Radiology No results found.   Assessment/Plan 1. Lymphedema Recommend:  No surgery or intervention at this point in time.    I have reviewed my previous discussion with the patient regarding swelling and why it causes symptoms.  Patient will continue wearing graduated compression stockings class 1 (20-30 mmHg) on a daily basis. The  patient will  beginning wearing the stockings first thing in the morning and removing them in the evening. The patient is instructed specifically not to sleep in the stockings.    In addition, behavioral modification including several periods of elevation of the lower extremities during the day will be continued.  This was reviewed with the patient during the initial visit.  The patient will also continue routine exercise, especially walking on a daily basis as was discussed during the initial visit.    Despite conservative treatments including graduated compression therapy class 1 and behavioral modification including exercise and elevation the patient  has not obtained adequate control of the lymphedema.  The patient still has stage 3 lymphedema and therefore, I believe that a lymph pump should be added to improve the control of the patient's lymphedema.  Additionally, a lymph pump is warranted because it will reduce the risk of cellulitis and ulceration in the future.  Patient should follow-up in six months    2. Chronic venous insufficiency Recommend:  No surgery or intervention at this point in time.    I have reviewed my previous discussion with the patient regarding swelling and why it causes symptoms.  Patient will continue  wearing graduated compression stockings class 1 (20-30 mmHg) on a daily basis. The patient will  beginning wearing the stockings first thing in the morning and removing them in the evening. The patient is instructed specifically not to sleep in the stockings.    In addition, behavioral modification including several periods of elevation of the lower extremities during the day will be continued.  This was reviewed with the patient during the initial visit.  The patient will also continue routine exercise, especially walking on a daily basis as was discussed during the initial visit.    Despite conservative treatments including graduated compression therapy class 1 and behavioral modification including exercise and elevation the patient  has not obtained adequate control of the lymphedema.  The patient still has stage 3 lymphedema and therefore, I believe that a lymph pump should be added to improve the control of the patient's lymphedema.  Additionally, a lymph pump is warranted because it will reduce the risk of cellulitis and ulceration in the future.  Patient should follow-up in six months    3. Chronic atrial fibrillation (HCC) Continue antiarrhythmia medications as already ordered, these medications have been reviewed and there are no changes at this time.  Continue anticoagulation as ordered by Cardiology Service   4. Type 2 diabetes mellitus with other circulatory complication, unspecified whether long term insulin use (HCC) Continue hypoglycemic medications as already ordered, these medications have been reviewed and there are no changes at this time.  Hgb A1C to be monitored as already arranged by primary service   5. Mixed hyperlipidemia Continue statin as ordered and reviewed, no changes at this time     Hortencia Pilar, MD  11/13/2020 3:56 PM

## 2020-11-14 ENCOUNTER — Encounter (INDEPENDENT_AMBULATORY_CARE_PROVIDER_SITE_OTHER): Payer: Self-pay | Admitting: Vascular Surgery

## 2020-11-15 ENCOUNTER — Ambulatory Visit: Payer: Medicare PPO | Admitting: Family Medicine

## 2020-11-15 ENCOUNTER — Other Ambulatory Visit: Payer: Self-pay

## 2020-11-15 ENCOUNTER — Encounter: Payer: Self-pay | Admitting: Family Medicine

## 2020-11-15 VITALS — BP 132/71 | HR 76 | Temp 98.3°F | Wt 236.0 lb

## 2020-11-15 DIAGNOSIS — H6121 Impacted cerumen, right ear: Secondary | ICD-10-CM

## 2020-11-15 NOTE — Progress Notes (Signed)
j   BP 132/71   Pulse 76   Temp 98.3 F (36.8 C)   Wt 236 lb (107 kg)   SpO2 95%   BMI 43.16 kg/m    Subjective:    Patient ID: Karen Farley, female    DOB: January 24, 1946, 75 y.o.   MRN: [id number]  HPI: Karen Farley is a 75 y.o. female  Chief Complaint  Patient presents with  . Hearing Problem    Patient states she can not hear from her right ear, feels like its stopped up. Believes it is impacted ear wax.    EAG CLOGGED Duration: days Involved ear(s):  "right Sensation of feeling clogged/plugged: yes Decreased/muffled hearing:yes Ear pain: no Fever: no Otorrhea: no Hearing loss: yes Upper respiratory infection symptoms: no Using Q-Tips: no Status: worse History of cerumenosis: yes Treatments attempted: peroxide  Relevant past medical, surgical, family and social history reviewed and updated as indicated. Interim medical history since our last visit reviewed. Allergies and medications reviewed and updated.  Review of Systems  Constitutional: Negative.   HENT: Positive for hearing loss. Negative for congestion, dental problem, drooling, ear discharge, ear pain, facial swelling, mouth sores, nosebleeds, postnasal drip, rhinorrhea, sinus pressure, sinus pain, sneezing, sore throat, tinnitus, trouble swallowing and voice change.   Respiratory: Negative.   Cardiovascular: Negative.   Musculoskeletal: Negative.   Psychiatric/Behavioral: Negative.     Per HPI unless specifically indicated above     Objective:    BP 132/71   Pulse 76   Temp 98.3 F (36.8 C)   Wt 236 lb (107 kg)   SpO2 95%   BMI 43.16 kg/m   Wt Readings from Last 3 Encounters:  11/15/20 236 lb (107 kg)  11/13/20 229 lb (103.9 kg)  10/09/20 237 lb (107.5 kg)    Physical Exam Vitals and nursing note reviewed.  Constitutional:      General: She is not in acute distress.    Appearance: Normal appearance. She is not ill-appearing, toxic-appearing or diaphoretic.  HENT:     Head:  Normocephalic and atraumatic.     Right Ear: External ear normal. There is impacted cerumen.     Left Ear: External ear normal.     Ears:     Comments: Wax in left ear, not blocking TM    Nose: Nose normal.     Mouth/Throat:     Mouth: Mucous membranes are moist.     Pharynx: Oropharynx is clear.  Eyes:     General: No scleral icterus.       Right eye: No discharge.        Left eye: No discharge.     Extraocular Movements: Extraocular movements intact.     Conjunctiva/sclera: Conjunctivae normal.     Pupils: Pupils are equal, round, and reactive to light.  Cardiovascular:     Rate and Rhythm: Normal rate and regular rhythm.     Pulses: Normal pulses.     Heart sounds: Normal heart sounds. No murmur heard. No friction rub. No gallop.   Pulmonary:     Effort: Pulmonary effort is normal. No respiratory distress.     Breath sounds: Normal breath sounds. No stridor. No wheezing, rhonchi or rales.  Chest:     Chest wall: No tenderness.  Musculoskeletal:        General: Normal range of motion.     Cervical back: Normal range of motion and neck supple.  Skin:    General: Skin is warm and dry.  Capillary Refill: Capillary refill takes less than 2 seconds.     Coloration: Skin is not jaundiced or pale.     Findings: No bruising, erythema, lesion or rash.  Neurological:     General: No focal deficit present.     Mental Status: She is alert and oriented to person, place, and time. Mental status is at baseline.  Psychiatric:        Mood and Affect: Mood normal.        Behavior: Behavior normal.        Thought Content: Thought content normal.        Judgment: Judgment normal.     Results for orders placed or performed in visit on 10/09/20  Bayer DCA Hb A1c Waived  Result Value Ref Range   HB A1C (BAYER DCA - WAIVED) 7.2 (H) <7.0 %      Assessment & Plan:   Problem List Items Addressed This Visit   None   Visit Diagnoses    Impacted cerumen of right ear    -  Primary    Ear flushed today with good results. Call with any concerns.        Follow up plan: Return if symptoms worsen or fail to improve.

## 2020-11-24 DIAGNOSIS — H40053 Ocular hypertension, bilateral: Secondary | ICD-10-CM | POA: Diagnosis not present

## 2020-12-05 DIAGNOSIS — I890 Lymphedema, not elsewhere classified: Secondary | ICD-10-CM | POA: Diagnosis not present

## 2020-12-18 ENCOUNTER — Other Ambulatory Visit: Payer: Self-pay | Admitting: Family Medicine

## 2021-01-01 ENCOUNTER — Other Ambulatory Visit: Payer: Self-pay

## 2021-01-01 ENCOUNTER — Encounter: Payer: Self-pay | Admitting: Cardiology

## 2021-01-01 ENCOUNTER — Ambulatory Visit: Payer: Medicare PPO | Admitting: Cardiology

## 2021-01-01 VITALS — BP 120/80 | HR 61 | Ht 62.0 in | Wt 234.0 lb

## 2021-01-01 DIAGNOSIS — I5189 Other ill-defined heart diseases: Secondary | ICD-10-CM

## 2021-01-01 DIAGNOSIS — I10 Essential (primary) hypertension: Secondary | ICD-10-CM | POA: Diagnosis not present

## 2021-01-01 DIAGNOSIS — I480 Paroxysmal atrial fibrillation: Secondary | ICD-10-CM | POA: Diagnosis not present

## 2021-01-01 NOTE — Progress Notes (Signed)
Cardiology Office Note:    Date:  01/01/2021   ID:  PAYTYN Karen Farley, DOB 1946-06-22, MRN [id number]  PCP:  Valerie Roys, DO  Cardiologist:  Kate Sable, MD  Electrophysiologist:  None   Referring MD: Valerie Roys, DO   Chief Complaint  Patient presents with  . 6 month follow up     Patient c/o LE edema. Medications reviewed by the patient verbally.     History of Present Illness:    Karen Farley is a 75 y.o. female with a hx of CKD stage III, HFpEF, hypertension, paroxysmal atrial fibrillation, hyperlipidemia, who presents for follow-up.    Being seen for HFpEF and A. fib.  Torsemide titrated after last visit.  Currently, weight is well maintained on torsemide 20 mg daily.  Uses compression stockings to help with lower extremity edema.  Denies palpitations, denies any bleeding issues on Xarelto.  Needs to make appointment to follow-up with nephrology.   Prior notes Patient saw PCP on November 16, 2019 and EKG noted to be in atrial fibrillation.  She was started on Toprol-XL and Xarelto. She states having a long history of lower extremity swelling for years.  Karen Farley  TTEcho 11/2019 showed normal systolic function with ejection fraction of EF 60 to 65%, moderately dilated LA, grade 2 diastolic dysfunction.    Past Medical History:  Diagnosis Date  . 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 DDD (degenerative disc disease), lumbar   . Degenerative disc disease, lumbar    bulging and dengerated  . Diabetes mellitus without complication (Karen Farley)   . Hyperlipidemia   . Hypertension   . Osteoarthritis of both knees   . Osteopenia     Past Surgical History:  Procedure Laterality Date  . CHOLECYSTECTOMY    . DILATION AND CURETTAGE OF UTERUS    . TEAR DUCT PROBING WITH STRABISMUS REPAIR Right   . TONSILLECTOMY      Current Medications: Current Meds  Medication Sig  . ACCU-CHEK GUIDE test strip USE TO CHECK BLOOD SUGAR DAILY  . benazepril (LOTENSIN)  20 MG tablet Take 1 tablet (20 mg total) by mouth daily.  Regino Schultze Bandages & Supports (MEDICAL COMPRESSION STOCKINGS) MISC by Does not apply route.  . latanoprost (XALATAN) 0.005 % ophthalmic solution 1 drop at bedtime.  . metFORMIN (GLUCOPHAGE-XR) 500 MG 24 hr tablet Take 2 tabs in the AM and 1 tab in the PM  . metoprolol succinate (TOPROL-XL) 50 MG 24 hr tablet Take 1 tablet (50 mg total) by mouth daily. Take with or immediately following a meal.  . Multiple Vitamin (MULTIVITAMIN WITH MINERALS) TABS tablet Take 1 tablet by mouth daily.  Karen Farley torsemide (DEMADEX) 20 MG tablet Take 1-2 tablets (20-40 mg total) by mouth daily. Only take 2 for 1-3 days and if persists call with bad swelling  . traMADol (ULTRAM) 50 MG tablet Take 1-2 tablets (50-100 mg total) by mouth every 6 (six) hours as needed.  . Vitamin D, Cholecalciferol, 50 MCG (2000 UT) CAPS Take 1 tablet by mouth.  Alveda Reasons 20 MG TABS tablet TAKE 1 TABLET(20 MG) BY MOUTH DAILY WITH SUPPER     Allergies:   Patient has no known allergies.   Social History   Socioeconomic History  . Marital status: Married    Spouse name: Not on file  . Number of children: Not on file  . Years of education: Not on file  . Highest education level: Not on  file  Occupational History  . Not on file  Tobacco Use  . Smoking status: Never Smoker  . Smokeless tobacco: Never Used  Vaping Use  . Vaping Use: Never used  Substance and Sexual Activity  . Alcohol use: Not Currently    Alcohol/week: 0.0 standard drinks    Comment: on rare occasion  . Drug use: No  . Sexual activity: Not Currently  Other Topics Concern  . Not on file  Social History Narrative  . Not on file   Social Determinants of Health   Financial Resource Strain: Low Risk   . Difficulty of Paying Living Expenses: Not hard at all  Food Insecurity: No Food Insecurity  . Worried About Charity fundraiser in the Last Year: Never true  . Ran Out of Food in the Last Year: Never true   Transportation Needs: No Transportation Needs  . Lack of Transportation (Medical): No  . Lack of Transportation (Non-Medical): No  Physical Activity: Inactive  . Days of Exercise per Week: 0 days  . Minutes of Exercise per Session: 0 min  Stress: No Stress Concern Present  . Feeling of Stress : Not at all  Social Connections: Not on file     Family History: The patient's family history includes Breast cancer in her maternal grandmother and mother; Cancer in her brother; Heart disease in her brother and mother; Heart disease (age of onset: 57) in her father; Osteoporosis in her mother; Parkinson's disease in her brother.  ROS:   Please see the history of present illness.     All other systems reviewed and are negative.  EKGs/Labs/Other Studies Reviewed:    The following studies were reviewed today:   EKG:  EKG is  ordered today.  The ekg ordered today demonstrates sinus rhythm, heart rate 61  Recent Labs: 06/06/2020: ALT 17; BUN 25; Creatinine, Ser 1.36; Hemoglobin 15.6; Platelets 310; Potassium 4.4; Sodium 139; TSH 2.680  Recent Lipid Panel    Component Value Date/Time   CHOL 199 06/06/2020 1340   CHOL 211 (H) 06/28/2016 1317   TRIG 128 06/06/2020 1340   TRIG 204 (H) 06/28/2016 1317   HDL 51 06/06/2020 1340   VLDL 41 (H) 06/28/2016 1317   LDLCALC 125 (H) 06/06/2020 1340    Physical Exam:    VS:  BP 120/80 (BP Location: Left Arm, Patient Position: Sitting, Cuff Size: Large)   Pulse 61   Ht 5\' 2"  (1.575 m)   Wt 234 lb (106.1 kg)   SpO2 (!) 61%   BMI 42.80 kg/m     Wt Readings from Last 3 Encounters:  01/01/21 234 lb (106.1 kg)  11/15/20 236 lb (107 kg)  11/13/20 229 lb (103.9 kg)     GEN:  Well nourished, well developed in no acute distress HEENT: Normal NECK: No JVD; No carotid bruits LYMPHATICS: No lymphadenopathy CARDIAC: Regula decreased breath sounds at bases ABDOMEN: Soft, non-tender, non-distended MUSCULOSKELETAL: 1+ edema; No deformity  SKIN: Warm  and dry NEUROLOGIC:  Alert and oriented x 3 PSYCHIATRIC:  Normal affect   ASSESSMENT:    1. PAF (paroxysmal atrial fibrillation) ([place])   2. Diastolic dysfunction   3. Essential hypertension   4. Morbid obesity ([place])    PLAN:      1. Paroxysmal atrial fibrillation.  CHA2DS2-VASc of 4.  Currently in sinus rhythm.  Continue Toprol-XL to 50 mg daily, continue Xarelto 20 mg daily.  Echo with normal EF, moderately dilated left atrium. 2. HFpEF, grade 2 diastolic  function.  Renal function stable.  Continue torsemide 20 mg daily.  Can take additional dose of torsemide for weight gain.  1+ edema lower extremity. 3. History of hypertension, blood pressure controlled.  Continue Benicar, Toprol-XL 4. Patient is morbidly obese, weight loss advised  Follow-up 6 months   This note was generated in part or whole with voice recognition software. Voice recognition is usually quite accurate but there are transcription errors that can and very often do occur. I apologize for any typographical errors that were not detected and corrected.  Medication Adjustments/Labs and Tests Ordered: Current medicines are reviewed at length with the patient today.  Concerns regarding medicines are outlined above.  Orders Placed This Encounter  Procedures  . EKG 12-Lead   No orders of the defined types were placed in this encounter.   Patient Instructions  Medication Instructions:  Your physician recommends that you continue on your current medications as directed. Please refer to the Current Medication list given to you today.  *If you need a refill on your cardiac medications before your next appointment, please call your pharmacy*   Lab Work: None ordered If you have labs (blood work) drawn today and your tests are completely normal, you will receive your results only by: Karen Farley MyChart Message (if you have MyChart) OR . A paper copy in the mail If you have any lab test that is abnormal or we need to change  your treatment, we will call you to review the results.   Testing/Procedures: None ordered   Follow-Up: At [facility], you and your health needs are our priority.  As part of our continuing mission to provide you with exceptional heart care, we have created designated Provider Care Teams.  These Care Teams include your primary Cardiologist (physician) and Advanced Practice Providers (APPs -  Physician Assistants and Nurse Practitioners) who all work together to provide you with the care you need, when you need it.  We recommend signing up for the patient portal called "MyChart".  Sign up information is provided on this After Visit Summary.  MyChart is used to connect with patients for Virtual Visits (Telemedicine).  Patients are able to view lab/test results, encounter notes, upcoming appointments, etc.  Non-urgent messages can be sent to your provider as well.   To learn more about what you can do with MyChart, go to [email or web address].    Your next appointment:   6 month(s)  The format for your next appointment:   In Person  Provider:   Kate Sable, MD   Other Instructions     Signed, Kate Sable, MD  01/01/2021 5:11 PM    [place]

## 2021-01-01 NOTE — Patient Instructions (Signed)
Medication Instructions:  Your physician recommends that you continue on your current medications as directed. Please refer to the Current Medication list given to you today. *If you need a refill on your cardiac medications before your next appointment, please call your pharmacy*   Lab Work: None ordered If you have labs (blood work) drawn today and your tests are completely normal, you will receive your results only by: MyChart Message (if you have MyChart) OR A paper copy in the mail If you have any lab test that is abnormal or we need to change your treatment, we will call you to review the results.   Testing/Procedures: None ordered   Follow-Up: At [facility], you and your health needs are our priority.  As part of our continuing mission to provide you with exceptional heart care, we have created designated Provider Care Teams.  These Care Teams include your primary Cardiologist (physician) and Advanced Practice Providers (APPs -  Physician Assistants and Nurse Practitioners) who all work together to provide you with the care you need, when you need it.  We recommend signing up for the patient portal called "MyChart".  Sign up information is provided on this After Visit Summary.  MyChart is used to connect with patients for Virtual Visits (Telemedicine).  Patients are able to view lab/test results, encounter notes, upcoming appointments, etc.  Non-urgent messages can be sent to your provider as well.   To learn more about what you can do with MyChart, go to [email or web address].    Your next appointment:   6 month(s)  The format for your next appointment:   In Person  Provider:   Brian Agbor-Etang, MD   Other Instructions   

## 2021-01-15 ENCOUNTER — Ambulatory Visit: Payer: Medicare PPO | Admitting: Nurse Practitioner

## 2021-01-15 ENCOUNTER — Encounter: Payer: Self-pay | Admitting: Nurse Practitioner

## 2021-01-15 ENCOUNTER — Other Ambulatory Visit: Payer: Self-pay

## 2021-01-15 VITALS — BP 122/72 | HR 65 | Temp 98.3°F | Wt 234.8 lb

## 2021-01-15 DIAGNOSIS — E1122 Type 2 diabetes mellitus with diabetic chronic kidney disease: Secondary | ICD-10-CM

## 2021-01-15 DIAGNOSIS — I480 Paroxysmal atrial fibrillation: Secondary | ICD-10-CM

## 2021-01-15 DIAGNOSIS — N1832 Chronic kidney disease, stage 3b: Secondary | ICD-10-CM

## 2021-01-15 DIAGNOSIS — I129 Hypertensive chronic kidney disease with stage 1 through stage 4 chronic kidney disease, or unspecified chronic kidney disease: Secondary | ICD-10-CM

## 2021-01-15 DIAGNOSIS — N183 Chronic kidney disease, stage 3 unspecified: Secondary | ICD-10-CM | POA: Diagnosis not present

## 2021-01-15 DIAGNOSIS — E782 Mixed hyperlipidemia: Secondary | ICD-10-CM | POA: Diagnosis not present

## 2021-01-15 MED ORDER — BENAZEPRIL HCL 20 MG PO TABS: 20.0000 mg | ORAL_TABLET | Freq: Every day | ORAL | 1 refills | Status: DC

## 2021-01-15 MED ORDER — METFORMIN HCL ER 500 MG PO TB24: ORAL_TABLET | ORAL | 0 refills | Status: DC

## 2021-01-15 NOTE — Progress Notes (Signed)
Established Patient Office Visit  Subjective:  Patient ID: Karen Farley, female    DOB: [birth year]  Age: 75 y.o. MRN: [id number]  CC:  Chief Complaint  Patient presents with  . Diabetes  . Hypertension    HPI Karen Farley presents for follow-up on hypertension, diabetes, and hyperlipidemia.  HYPERTENSION / HYPERLIPIDEMIA  Satisfied with current treatment? yes Duration of hypertension: chronic BP monitoring frequency: a few times a week BP range: not as accurate as in the office, usually reads higher BP medication side effects: no Past BP meds: benazepril and toprol Duration of hyperlipidemia: chronic Cholesterol medication side effects: no Cholesterol supplements: none Past cholesterol medications: none Medication compliance: excellent compliance Aspirin: no Recent stressors: no Recurrent headaches: no Visual changes: no Palpitations: no Dyspnea: no Chest pain: no Lower extremity edema:yes Dizzy/lightheaded: no   DIABETES  Hypoglycemic episodes:no Polydipsia/polyuria: no Visual disturbance: no Chest pain: no Paresthesias: no Glucose Monitoring: yes  Accucheck frequency: Daily  Fasting glucose: 120s-130s  Post prandial:  Evening:  Before meals: Taking Insulin?: no  Long acting insulin:  Short acting insulin: Blood Pressure Monitoring: a few times a week Retinal Examination: Up to Date Foot Exam: Up to Date Diabetic Education: Completed Pneumovax: Up to Date Influenza: Up to Date Aspirin: no  Past Medical History:  Diagnosis Date  . 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 DDD (degenerative disc disease), lumbar   . Degenerative disc disease, lumbar    bulging and dengerated  . Diabetes mellitus without complication ([place])   . Hyperlipidemia   . Hypertension   . Osteoarthritis of both knees   . Osteopenia     Past Surgical History:  Procedure Laterality Date  . CHOLECYSTECTOMY    . DILATION AND CURETTAGE OF UTERUS     . TEAR DUCT PROBING WITH STRABISMUS REPAIR Right   . TONSILLECTOMY      Family History  Problem Relation Age of Onset  . Heart disease Mother        CHF  . Osteoporosis Mother   . Breast cancer Mother   . Heart disease Father 35  . Cancer Brother        Colon CA- 2002- Youngest brother  . Parkinson's disease Brother        Younger Brother  . Heart disease Brother        2 brothers  . Breast cancer Maternal Grandmother     Social History   Socioeconomic History  . Marital status: Married    Spouse name: Not on file  . Number of children: Not on file  . Years of education: Not on file  . Highest education level: Not on file  Occupational History  . Not on file  Tobacco Use  . Smoking status: Never Smoker  . Smokeless tobacco: Never Used  Vaping Use  . Vaping Use: Never used  Substance and Sexual Activity  . Alcohol use: Not Currently    Alcohol/week: 0.0 standard drinks    Comment: on rare occasion  . Drug use: No  . Sexual activity: Not Currently  Other Topics Concern  . Not on file  Social History Narrative  . Not on file   Social Determinants of Health   Financial Resource Strain: Low Risk   . Difficulty of Paying Living Expenses: Not hard at all  Food Insecurity: No Food Insecurity  . Worried About Charity fundraiser in the Last Year: Never true  . Ran Out  of Food in the Last Year: Never true  Transportation Needs: No Transportation Needs  . Lack of Transportation (Medical): No  . Lack of Transportation (Non-Medical): No  Physical Activity: Inactive  . Days of Exercise per Week: 0 days  . Minutes of Exercise per Session: 0 min  Stress: No Stress Concern Present  . Feeling of Stress : Not at all  Social Connections: Not on file  Intimate Partner Violence: Not on file    Outpatient Medications Prior to Visit  Medication Sig Dispense Refill  . ACCU-CHEK GUIDE test strip USE TO CHECK BLOOD SUGAR DAILY 100 strip 3  . Elastic Bandages & Supports  (MEDICAL COMPRESSION STOCKINGS) MISC by Does not apply route.    . latanoprost (XALATAN) 0.005 % ophthalmic solution 1 drop at bedtime.    . Multiple Vitamin (MULTIVITAMIN WITH MINERALS) TABS tablet Take 1 tablet by mouth daily.    Marland Kitchen torsemide (DEMADEX) 20 MG tablet Take 1-2 tablets (20-40 mg total) by mouth daily. Only take 2 for 1-3 days and if persists call with bad swelling 180 tablet 0  . traMADol (ULTRAM) 50 MG tablet Take 1-2 tablets (50-100 mg total) by mouth every 6 (six) hours as needed. 30 tablet 0  . Vitamin D, Cholecalciferol, 50 MCG (2000 UT) CAPS Take 1 tablet by mouth.    Alveda Reasons 20 MG TABS tablet TAKE 1 TABLET(20 MG) BY MOUTH DAILY WITH SUPPER 90 tablet 2  . benazepril (LOTENSIN) 20 MG tablet Take 1 tablet (20 mg total) by mouth daily. 90 tablet 0  . metFORMIN (GLUCOPHAGE-XR) 500 MG 24 hr tablet Take 2 tabs in the AM and 1 tab in the PM 270 tablet 0  . metoprolol succinate (TOPROL-XL) 50 MG 24 hr tablet Take 1 tablet (50 mg total) by mouth daily. Take with or immediately following a meal. 90 tablet 1   No facility-administered medications prior to visit.    No Known Allergies  ROS Review of Systems  Constitutional: Negative for fatigue.  HENT: Positive for congestion. Negative for ear pain, rhinorrhea and sore throat.   Eyes: Negative.   Respiratory: Negative.   Cardiovascular: Negative.   Gastrointestinal: Negative.   Endocrine: Negative.   Genitourinary: Negative.   Musculoskeletal: Negative.   Neurological: Negative.   Psychiatric/Behavioral: Negative.       Objective:    Physical Exam Vitals and nursing note reviewed.  Constitutional:      General: She is not in acute distress.    Appearance: Normal appearance.  HENT:     Head: Normocephalic and atraumatic.  Eyes:     Conjunctiva/sclera: Conjunctivae normal.  Neck:     Vascular: No carotid bruit.  Cardiovascular:     Rate and Rhythm: Normal rate and regular rhythm.     Pulses: Normal pulses.      Heart sounds: Normal heart sounds.  Pulmonary:     Effort: Pulmonary effort is normal.     Breath sounds: Normal breath sounds.  Abdominal:     Palpations: Abdomen is soft.     Tenderness: There is no abdominal tenderness.  Musculoskeletal:     Cervical back: Normal range of motion.     Right lower leg: Edema (1+ pitting) present.     Left lower leg: Edema (1+ pitting) present.  Skin:    General: Skin is warm and dry.  Neurological:     General: No focal deficit present.     Mental Status: She is alert and oriented to person, place, and  time.  Psychiatric:        Mood and Affect: Mood normal.        Behavior: Behavior normal.        Thought Content: Thought content normal.        Judgment: Judgment normal.     BP 122/72   Pulse 65   Temp 98.3 F (36.8 C) (Oral)   Wt 234 lb 12.8 oz (106.5 kg)   SpO2 97%   BMI 42.95 kg/m  Wt Readings from Last 3 Encounters:  01/15/21 234 lb 12.8 oz (106.5 kg)  01/01/21 234 lb (106.1 kg)  11/15/20 236 lb (107 kg)     Lab Results  Component Value Date   TSH 2.680 06/06/2020   Lab Results  Component Value Date   WBC 5.7 06/06/2020   HGB 15.6 06/06/2020   HCT 46.4 06/06/2020   MCV 87 06/06/2020   PLT 310 06/06/2020   Lab Results  Component Value Date   NA 139 06/06/2020   K 4.4 06/06/2020   CO2 23 06/06/2020   GLUCOSE 157 (H) 06/06/2020   BUN 25 06/06/2020   CREATININE 1.36 (H) 06/06/2020   BILITOT 1.0 06/06/2020   ALKPHOS 72 06/06/2020   AST 21 06/06/2020   ALT 17 06/06/2020   PROT 6.5 06/06/2020   ALBUMIN 4.4 06/06/2020   CALCIUM 10.2 06/06/2020   ANIONGAP 13 03/21/2020   Lab Results  Component Value Date   CHOL 199 06/06/2020   Lab Results  Component Value Date   HDL 51 06/06/2020   Lab Results  Component Value Date   LDLCALC 125 (H) 06/06/2020   Lab Results  Component Value Date   TRIG 128 06/06/2020   No results found for: CHOLHDL Lab Results  Component Value Date   HGBA1C 7.2 (H) 10/09/2020       Assessment & Plan:   Problem List Items Addressed This Visit      Cardiovascular and Mediastinum   Atrial fibrillation (HCC)    Paroxysmal a-fib. Followed by cardiology, last saw Dr. Garen Lah on 01/01/21. On xarelto. Continue current regimen. Call with any concerns. Check CBC today.      Relevant Medications   benazepril (LOTENSIN) 20 MG tablet     Endocrine   Type 2 diabetes mellitus with renal complication (HCC) - Primary    A1C today is 7.3%, she states her fasting sugars at home have been 120s-130s the past few weeks. She has tolerated the increase in metformin with no side effects. With her kidney function, do not want to increase metformin any further. She opts to watch her diet more and will recheck A1C in 3 months. If not improved, will add on glp-1 vs sglt-2 next visit. Check CMP, CBC today. Refills sent to pharmacy.      Relevant Medications   benazepril (LOTENSIN) 20 MG tablet   metFORMIN (GLUCOPHAGE-XR) 500 MG 24 hr tablet   Other Relevant Orders   Comp Met (CMET)   CBC with Differential   Bayer DCA Hb A1c Waived   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 diabetes mellitus (HCC)    Chronic, stable. Follows with nephrology and saw Dr. Candiss Norse 07/26/20. Will check CMP today. Continue current regimen. Wear compression socks to help with edema in legs. Follow-up with any concerns.      Relevant Medications   benazepril (LOTENSIN) 20 MG tablet   metFORMIN (GLUCOPHAGE-XR) 500 MG 24 hr tablet     Genitourinary   Benign hypertensive renal disease  Chronic, stable. BP 122/72 today. Continue current regimen. Refills sent to the pharmacy. Check CMP CBC. Follow-up in 6 months      Relevant Orders   Comp Met (CMET)   CBC with Differential     Other   Hyperlipidemia    Chronic. Her ASCVD score is 31%. Discussed starting statin to protect from heart attack and stroke. She is hesitant to start a new medication. Education provided to patient. Will check lipid panel today.       Relevant  Medications   benazepril (LOTENSIN) 20 MG tablet   Other Relevant Orders   Lipid Panel w/o Chol/HDL Ratio      Meds ordered this encounter  Medications  . benazepril (LOTENSIN) 20 MG tablet    Sig: Take 1 tablet (20 mg total) by mouth daily.    Dispense:  90 tablet    Refill:  1  . metFORMIN (GLUCOPHAGE-XR) 500 MG 24 hr tablet    Sig: Take 2 tabs in the AM and 1 tab in the PM    Dispense:  270 tablet    Refill:  0    Follow-up: Return in about 3 months (around 04/17/2021) for dm, hld.     Charyl Dancer, NP

## 2021-01-15 NOTE — Assessment & Plan Note (Signed)
Paroxysmal a-fib. Followed by cardiology, last saw Dr. Garen Lah on 01/01/21. On xarelto. Continue current regimen. Call with any concerns. Check CBC today.

## 2021-01-15 NOTE — Assessment & Plan Note (Addendum)
A1C today is 7.3%, she states her fasting sugars at home have been 120s-130s the past few weeks. She has tolerated the increase in metformin with no side effects. With her kidney function, do not want to increase metformin any further. She opts to watch her diet more and will recheck A1C in 3 months. If not improved, will add on glp-1 vs sglt-2 next visit. Check CMP, CBC today. Refills sent to pharmacy.

## 2021-01-15 NOTE — Patient Instructions (Signed)
High Cholesterol  High cholesterol is a condition in which the blood has high levels of a white, waxy substance similar to fat (cholesterol). The liver makes all the cholesterol that the body needs. The human body needs small amounts of cholesterol to help build cells. A person gets extra or excess cholesterol from the food that he or she eats. The blood carries cholesterol from the liver to the rest of the body. If you have high cholesterol, deposits (plaques) may build up on the walls of your arteries. Arteries are the blood vessels that carry blood away from your heart. These plaques make the arteries narrow and stiff. Cholesterol plaques increase your risk for heart attack and stroke. Work with your health care provider to keep your cholesterol levels in a healthy range. What increases the risk? The following factors may make you more likely to develop this condition:  Eating foods that are high in animal fat (saturated fat) or cholesterol.  Being overweight.  Not getting enough exercise.  A family history of high cholesterol (familial hypercholesterolemia).  Use of tobacco products.  Having diabetes. What are the signs or symptoms? There are no symptoms of this condition. How is this diagnosed? This condition may be diagnosed based on the results of a blood test.  If you are older than 75 years of age, your health care provider may check your cholesterol levels every 4-6 years.  You may be checked more often if you have high cholesterol or other risk factors for heart disease. The blood test for cholesterol measures:  "Bad" cholesterol, or LDL cholesterol. This is the main type of cholesterol that causes heart disease. The desired level is less than 100 mg/dL.  "Good" cholesterol, or HDL cholesterol. HDL helps protect against heart disease by cleaning the arteries and carrying the LDL to the liver for processing. The desired level for HDL is 60 mg/dL or higher.  Triglycerides.  These are fats that your body can store or burn for energy. The desired level is less than 150 mg/dL.  Total cholesterol. This measures the total amount of cholesterol in your blood and includes LDL, HDL, and triglycerides. The desired level is less than 200 mg/dL. How is this treated? This condition may be treated with:  Diet changes. You may be asked to eat foods that have more fiber and less saturated fats or added sugar.  Lifestyle changes. These may include regular exercise, maintaining a healthy weight, and quitting use of tobacco products.  Medicines. These are given when diet and lifestyle changes have not worked. You may be prescribed a statin medicine to help lower your cholesterol levels. Follow these instructions at home: Eating and drinking  Eat a healthy, balanced diet. This diet includes: ? Daily servings of a variety of fresh, frozen, or canned fruits and vegetables. ? Daily servings of whole grain foods that are rich in fiber. ? Foods that are low in saturated fats and trans fats. These include poultry and fish without skin, lean cuts of meat, and low-fat dairy products. ? A variety of fish, especially oily fish that contain omega-3 fatty acids. Aim to eat fish at least 2 times a week.  Avoid foods and drinks that have added sugar.  Use healthy cooking methods, such as roasting, grilling, broiling, baking, poaching, steaming, and stir-frying. Do not fry your food except for stir-frying.   Lifestyle  Get regular exercise. Aim to exercise for a total of 150 minutes a week. Increase your activity level by doing activities   such as gardening, walking, and taking the stairs.  Do not use any products that contain nicotine or tobacco, such as cigarettes, e-cigarettes, and chewing tobacco. If you need help quitting, ask your health care provider.   General instructions  Take over-the-counter and prescription medicines only as told by your health care provider.  Keep all  follow-up visits as told by your health care provider. This is important. Where to find more information  American Heart Association: www.heart.org  National Heart, Lung, and Blood Institute: https://wilson-eaton.com/ Contact a health care provider if:  You have trouble achieving or maintaining a healthy diet or weight.  You are starting an exercise program.  You are unable to stop smoking. Get help right away if:  You have chest pain.  You have trouble breathing.  You have any symptoms of a stroke. "BE FAST" is an easy way to remember the main warning signs of a stroke: ? B - Balance. Signs are dizziness, sudden trouble walking, or loss of balance. ? E - Eyes. Signs are trouble seeing or a sudden change in vision. ? F - Face. Signs are sudden weakness or numbness of the face, or the face or eyelid drooping on one side. ? A - Arms. Signs are weakness or numbness in an arm. This happens suddenly and usually on one side of the body. ? S - Speech. Signs are sudden trouble speaking, slurred speech, or trouble understanding what people say. ? T - Time. Time to call emergency services. Write down what time symptoms started.  You have other signs of a stroke, such as: ? A sudden, severe headache with no known cause. ? Nausea or vomiting. ? Seizure. These symptoms may represent a serious problem that is an emergency. Do not wait to see if the symptoms will go away. Get medical help right away. Call your local emergency services (911 in the [place] Do not drive yourself to the hospital. Summary  Cholesterol plaques increase your risk for heart attack and stroke. Work with your health care provider to keep your cholesterol levels in a healthy range.  Eat a healthy, balanced diet, get regular exercise, and maintain a healthy weight.  Do not use any products that contain nicotine or tobacco, such as cigarettes, e-cigarettes, and chewing tobacco.  Get help right away if you have any symptoms of a  stroke. This information is not intended to replace advice given to you by your health care provider. Make sure you discuss any questions you have with your health care provider. Document Revised: 09/06/2019 Document Reviewed: 09/06/2019 Elsevier Patient Education  2021 [place].   Atorvastatin Tablets What is this medicine? ATORVASTATIN (a TORE va sta tin) is a statin. It lowers bad cholesterol and triglyceride levels in the blood. It also increases good cholesterol levels. It is used with lifestyle changes, like diet and exercise. It may be used alone or with other drugs. This medicine may be used for other purposes; ask your health care provider or pharmacist if you have questions. COMMON BRAND NAME(S): Lipitor What should I tell my health care provider before I take this medicine? They need to know if you have any of these conditions:  diabetes (high blood sugar)  if you often drink alcohol  kidney disease  liver disease  muscle cramps, pain  stroke  thyroid disease  an unusual or allergic reaction to atorvastatin, other medicines, foods, dyes, or preservatives  pregnant or trying to get pregnant  breast-feeding How should I use this medicine?  Take this medicine by mouth. Take it as directed on the prescription label at the same time every day. You can take it with or without food. If it upsets your stomach, take it with food. Keep taking it unless your health care provider tells you to stop. Do not take this medicine with grapefruit juice. Talk to your health care provider about the use of this medicine in children. While it may be prescribed for children as young as 10 for selected conditions, precautions do apply. Overdosage: If you think you have taken too much of this medicine contact a poison control center or emergency room at once. NOTE: This medicine is only for you. Do not share this medicine with others. What if I miss a dose? If you miss a dose, take it as  soon as you can. If it is almost time for your next dose, take only that dose. Do not take double or extra doses. What may interact with this medicine? Do not take this medicine with any of the following medications:  dasabuvir; ombitasvir; paritaprevir; ritonavir  ombitasvir; paritaprevir; ritonavir  posaconazole  red yeast rice This medicine may also interact with the following medications:  alcohol  birth control pills  certain antibiotics like erythromycin and clarithromycin  certain antivirals for HIV or hepatitis  certain medicines for cholesterol like fenofibrate, gemfibrozil, and niacin  certain medicines for fungal infections like ketoconazole and itraconazole  colchicine  cyclosporine  digoxin  grapefruit juice  rifampin This list may not describe all possible interactions. Give your health care provider a list of all the medicines, herbs, non-prescription drugs, or dietary supplements you use. Also tell them if you smoke, drink alcohol, or use illegal drugs. Some items may interact with your medicine. What should I watch for while using this medicine? Visit your health care provider for regular checks on your progress. Tell your health care provider if your symptoms do not start to get better or if they get worse. Your health care provider may tell you to stop taking this medicine if you develop muscle problems. If your muscle problems do not go away after stopping this medicine, contact your health care provider. Do not become pregnant while taking this medicine. Women should inform their health care provider if they wish to become pregnant or think they might be pregnant. There is potential for serious harm to an unborn child. Talk to your health care provider for more information. Do not breast-feed an infant while taking this medicine. Birth control may not work properly while you are taking this medicine. Talk to your health care provider about using an extra  method of birth control. This medicine may increase blood sugar. Ask your health care provider if changes in diet or medicines are needed if you have diabetes. If you are going to need surgery or other procedure, tell your health care provider that you are using this medicine. Taking this medicine is only part of a total heart healthy program. Your health care provider may give you a special diet to follow. Avoid alcohol. Avoid smoking. Ask your health care provider how much you should exercise. What side effects may I notice from receiving this medicine? Side effects that you should report to your doctor or health care provider as soon as possible:  allergic reactions (skin rash, itching or hives; swelling of the face, lips, or tongue)  high blood sugar (increased hunger, thirst or urination; unusually weak or tired, blurry vision)  infection (fever, chills, cough, sore  throat, pain or trouble passing urine)  joint pain  liver injury (dark yellow or brown urine; general ill feeling or flu-like symptoms; loss of appetite, right upper belly pain; unusually weak or tired, yellowing of the eyes or skin)  muscle injury (dark urine; trouble passing urine or change in the amount of urine; unusually weak or tired; muscle pain; back pain)  redness, blistering, peeling, or loosening of the skin, including inside the mouth Side effects that usually do not require medical attention (report to your doctor or health care provider if they continue or are bothersome):  diarrhea  nausea  upset stomach This list may not describe all possible side effects. Call your doctor for medical advice about side effects. You may report side effects to FDA at 1-800-FDA-1088. Where should I keep my medicine? Keep out of the reach of children and pets. Store at room temperature between 20 and 25 degrees C (68 and 77 degrees F). Get rid of any unused medicine after the expiration date. To get rid of medicines that  are no longer needed or have expired:  Take the medicine to a medicine take-back program. Check with your pharmacy or law enforcement to find a location.  If you cannot return the medicine, check the label or package insert to see if the medicine should be thrown out in the garbage or flushed down the toilet. If you are not sure, ask your health care provider. If it is safe to put it in the trash, take the medicine out of the container. Mix the medicine with cat litter, dirt, coffee grounds, or other unwanted substance. Seal the mixture in a bag or container. Put it in the trash. NOTE: This sheet is a summary. It may not cover all possible information. If you have questions about this medicine, talk to your doctor, pharmacist, or health care provider.  2021 Elsevier/Gold Standard (2020-09-21 12:41:57)

## 2021-01-15 NOTE — Assessment & Plan Note (Addendum)
Chronic, stable. Follows with nephrology and saw Dr. Candiss Norse 07/26/20. Will check CMP today. Continue current regimen. Wear compression socks to help with edema in legs. Follow-up with any concerns.

## 2021-01-15 NOTE — Assessment & Plan Note (Signed)
Chronic. Her ASCVD score is 31%. Discussed starting statin to protect from heart attack and stroke. She is hesitant to start a new medication. Education provided to patient. Will check lipid panel today.

## 2021-01-15 NOTE — Assessment & Plan Note (Signed)
Chronic, stable. BP 122/72 today. Continue current regimen. Refills sent to the pharmacy. Check CMP CBC. Follow-up in 6 months

## 2021-01-16 LAB — COMPREHENSIVE METABOLIC PANEL
ALT: 13 IU/L (ref 0–32)
AST: 13 IU/L (ref 0–40)
Albumin/Globulin Ratio: 1.8 (ref 1.2–2.2)
Albumin: 4.3 g/dL (ref 3.7–4.7)
Alkaline Phosphatase: 74 IU/L (ref 44–121)
BUN/Creatinine Ratio: 24 (ref 12–28)
BUN: 39 mg/dL — ABNORMAL HIGH (ref 8–27)
Bilirubin Total: 0.6 mg/dL (ref 0.0–1.2)
CO2: 19 mmol/L — ABNORMAL LOW (ref 20–29)
Calcium: 9.8 mg/dL (ref 8.7–10.3)
Chloride: 101 mmol/L (ref 96–106)
Creatinine, Ser: 1.62 mg/dL — ABNORMAL HIGH (ref 0.57–1.00)
Globulin, Total: 2.4 g/dL (ref 1.5–4.5)
Glucose: 156 mg/dL — ABNORMAL HIGH (ref 65–99)
Potassium: 4.5 mmol/L (ref 3.5–5.2)
Sodium: 145 mmol/L — ABNORMAL HIGH (ref 134–144)
Total Protein: 6.7 g/dL (ref 6.0–8.5)
eGFR: 33 mL/min/{1.73_m2} — ABNORMAL LOW (ref >59)

## 2021-01-16 LAB — CBC WITH DIFFERENTIAL/PLATELET
Basophils Absolute: 0.1 10*3/uL (ref 0.0–0.2)
Basos: 1 %
EOS (ABSOLUTE): 0.3 10*3/uL (ref 0.0–0.4)
Eos: 4 %
Hematocrit: 42.8 % (ref 34.0–46.6)
Hemoglobin: 14.1 g/dL (ref 11.1–15.9)
Immature Grans (Abs): 0 10*3/uL (ref 0.0–0.1)
Immature Granulocytes: 1 %
Lymphocytes Absolute: 1.2 10*3/uL (ref 0.7–3.1)
Lymphs: 20 %
MCH: 29.6 pg (ref 26.6–33.0)
MCHC: 32.9 g/dL (ref 31.5–35.7)
MCV: 90 fL (ref 79–97)
Monocytes Absolute: 0.6 10*3/uL (ref 0.1–0.9)
Monocytes: 10 %
Neutrophils Absolute: 3.9 10*3/uL (ref 1.4–7.0)
Neutrophils: 64 %
Platelets: 286 10*3/uL (ref 150–450)
RBC: 4.77 x10E6/uL (ref 3.77–5.28)
RDW: 13.1 % (ref 11.7–15.4)
WBC: 6.2 10*3/uL (ref 3.4–10.8)

## 2021-01-16 LAB — LIPID PANEL W/O CHOL/HDL RATIO
Cholesterol, Total: 210 mg/dL — ABNORMAL HIGH (ref 100–199)
HDL: 49 mg/dL (ref >39)
LDL Chol Calc (NIH): 139 mg/dL — ABNORMAL HIGH (ref 0–99)
Triglycerides: 120 mg/dL (ref 0–149)
VLDL Cholesterol Cal: 22 mg/dL (ref 5–40)

## 2021-01-16 LAB — BAYER DCA HB A1C WAIVED: HB A1C (BAYER DCA - WAIVED): 7.3 % — ABNORMAL HIGH (ref <7.0)

## 2021-01-17 ENCOUNTER — Telehealth: Payer: Self-pay

## 2021-01-17 NOTE — Telephone Encounter (Signed)
Routing to provider  

## 2021-01-17 NOTE — Telephone Encounter (Signed)
Copied from [place] [phone] [phone]. Topic: General - Other >> Jan 17, 2021  3:19 PM Tessa Lerner A wrote: Reason for CRM: Patient would like to be contacted by a member of clinical staff to further discuss cholesterol medication  Patient has previously discussed a potential prescription with their PCP and has decided that they would like to receive the medication   Please contact to advise further when possible

## 2021-01-18 NOTE — Telephone Encounter (Signed)
You saw her last

## 2021-02-07 ENCOUNTER — Telehealth: Payer: Self-pay | Admitting: Cardiology

## 2021-02-07 MED ORDER — TORSEMIDE 20 MG PO TABS
20.0000 mg | ORAL_TABLET | Freq: Every day | ORAL | 0 refills | Status: DC
Start: 2021-02-07 — End: 2021-04-18

## 2021-02-07 NOTE — Telephone Encounter (Signed)
torsemide (DEMADEX) 20 MG tablet [[id number]]   Order Details Dose: 20-40 mg Route: Oral Frequency: Daily  Dispense Quantity: 180 tablet Refills: 0       Sig: Take 1-2 tablets (20-40 mg total) by mouth daily. Only take 2 for 1-3 days and if persists call with bad swelling      Start Date: 02/07/21 End Date: --  Written Date: 02/07/21 Expiration Date: 02/07/22  Original Order:  torsemide (DEMADEX) 20 MG tablet [[id number]]   Providers  Ordering and Authorizing Provider:   Kate Sable, MD  [address] [place], [place] [address]  Phone:  [phone]  Fax:  [phone]  DEA #:  [id number]  NPI:  [id number]     Ordering User:  Haila Dena, [address], [address]  [place], [place] [address]  Phone:  [phone] Fax:  [phone]  DEA #:  [id number]  [place] Reason: --

## 2021-02-28 DIAGNOSIS — I10 Essential (primary) hypertension: Secondary | ICD-10-CM | POA: Diagnosis not present

## 2021-02-28 DIAGNOSIS — E1122 Type 2 diabetes mellitus with diabetic chronic kidney disease: Secondary | ICD-10-CM | POA: Diagnosis not present

## 2021-02-28 DIAGNOSIS — N1832 Chronic kidney disease, stage 3b: Secondary | ICD-10-CM | POA: Diagnosis not present

## 2021-02-28 DIAGNOSIS — I890 Lymphedema, not elsewhere classified: Secondary | ICD-10-CM | POA: Diagnosis not present

## 2021-03-16 DIAGNOSIS — N1832 Chronic kidney disease, stage 3b: Secondary | ICD-10-CM | POA: Diagnosis not present

## 2021-03-29 ENCOUNTER — Encounter: Payer: Self-pay | Admitting: Obstetrics and Gynecology

## 2021-04-10 ENCOUNTER — Emergency Department: Payer: Medicare PPO

## 2021-04-10 ENCOUNTER — Other Ambulatory Visit: Payer: Self-pay

## 2021-04-10 ENCOUNTER — Inpatient Hospital Stay
Admission: EM | Admit: 2021-04-10 | Discharge: 2021-04-18 | DRG: 314 | Disposition: A | Discharge status: Home / Self Care | Payer: Medicare PPO | Attending: Internal Medicine | Admitting: Internal Medicine

## 2021-04-10 ENCOUNTER — Encounter: Payer: Self-pay | Admitting: Emergency Medicine

## 2021-04-10 DIAGNOSIS — E1051 Type 1 diabetes mellitus with diabetic peripheral angiopathy without gangrene: Secondary | ICD-10-CM | POA: Diagnosis present

## 2021-04-10 DIAGNOSIS — R6510 Systemic inflammatory response syndrome (SIRS) of non-infectious origin without acute organ dysfunction: Secondary | ICD-10-CM

## 2021-04-10 DIAGNOSIS — E871 Hypo-osmolality and hyponatremia: Secondary | ICD-10-CM

## 2021-04-10 DIAGNOSIS — N1832 Chronic kidney disease, stage 3b: Secondary | ICD-10-CM | POA: Diagnosis present

## 2021-04-10 DIAGNOSIS — R0902 Hypoxemia: Secondary | ICD-10-CM | POA: Diagnosis not present

## 2021-04-10 DIAGNOSIS — E785 Hyperlipidemia, unspecified: Secondary | ICD-10-CM | POA: Diagnosis present

## 2021-04-10 DIAGNOSIS — R7989 Other specified abnormal findings of blood chemistry: Secondary | ICD-10-CM | POA: Diagnosis present

## 2021-04-10 DIAGNOSIS — E872 Acidosis, unspecified: Secondary | ICD-10-CM

## 2021-04-10 DIAGNOSIS — I4891 Unspecified atrial fibrillation: Secondary | ICD-10-CM | POA: Diagnosis present

## 2021-04-10 DIAGNOSIS — I9589 Other hypotension: Secondary | ICD-10-CM | POA: Diagnosis not present

## 2021-04-10 DIAGNOSIS — M170 Bilateral primary osteoarthritis of knee: Secondary | ICD-10-CM | POA: Diagnosis present

## 2021-04-10 DIAGNOSIS — Z20822 Contact with and (suspected) exposure to covid-19: Secondary | ICD-10-CM | POA: Diagnosis present

## 2021-04-10 DIAGNOSIS — I5033 Acute on chronic diastolic (congestive) heart failure: Secondary | ICD-10-CM

## 2021-04-10 DIAGNOSIS — I130 Hypertensive heart and chronic kidney disease with heart failure and stage 1 through stage 4 chronic kidney disease, or unspecified chronic kidney disease: Secondary | ICD-10-CM | POA: Diagnosis present

## 2021-04-10 DIAGNOSIS — I480 Paroxysmal atrial fibrillation: Secondary | ICD-10-CM | POA: Diagnosis not present

## 2021-04-10 DIAGNOSIS — R42 Dizziness and giddiness: Secondary | ICD-10-CM

## 2021-04-10 DIAGNOSIS — Z8262 Family history of osteoporosis: Secondary | ICD-10-CM | POA: Diagnosis not present

## 2021-04-10 DIAGNOSIS — E1022 Type 1 diabetes mellitus with diabetic chronic kidney disease: Secondary | ICD-10-CM | POA: Diagnosis present

## 2021-04-10 DIAGNOSIS — Z8249 Family history of ischemic heart disease and other diseases of the circulatory system: Secondary | ICD-10-CM | POA: Diagnosis not present

## 2021-04-10 DIAGNOSIS — E861 Hypovolemia: Secondary | ICD-10-CM | POA: Diagnosis present

## 2021-04-10 DIAGNOSIS — E1122 Type 2 diabetes mellitus with diabetic chronic kidney disease: Secondary | ICD-10-CM | POA: Diagnosis not present

## 2021-04-10 DIAGNOSIS — I872 Venous insufficiency (chronic) (peripheral): Secondary | ICD-10-CM | POA: Diagnosis present

## 2021-04-10 DIAGNOSIS — Z820 Family history of epilepsy and other diseases of the nervous system: Secondary | ICD-10-CM

## 2021-04-10 DIAGNOSIS — Z7984 Long term (current) use of oral hypoglycemic drugs: Secondary | ICD-10-CM

## 2021-04-10 DIAGNOSIS — L03119 Cellulitis of unspecified part of limb: Secondary | ICD-10-CM | POA: Diagnosis not present

## 2021-04-10 DIAGNOSIS — J189 Pneumonia, unspecified organism: Secondary | ICD-10-CM

## 2021-04-10 DIAGNOSIS — Z79899 Other long term (current) drug therapy: Secondary | ICD-10-CM

## 2021-04-10 DIAGNOSIS — N189 Chronic kidney disease, unspecified: Secondary | ICD-10-CM | POA: Diagnosis not present

## 2021-04-10 DIAGNOSIS — E1129 Type 2 diabetes mellitus with other diabetic kidney complication: Secondary | ICD-10-CM | POA: Diagnosis present

## 2021-04-10 DIAGNOSIS — M5136 Other intervertebral disc degeneration, lumbar region: Secondary | ICD-10-CM | POA: Diagnosis present

## 2021-04-10 DIAGNOSIS — R630 Anorexia: Secondary | ICD-10-CM | POA: Diagnosis present

## 2021-04-10 DIAGNOSIS — Z9049 Acquired absence of other specified parts of digestive tract: Secondary | ICD-10-CM | POA: Diagnosis not present

## 2021-04-10 DIAGNOSIS — I959 Hypotension, unspecified: Secondary | ICD-10-CM | POA: Diagnosis present

## 2021-04-10 DIAGNOSIS — N179 Acute kidney failure, unspecified: Secondary | ICD-10-CM | POA: Diagnosis present

## 2021-04-10 DIAGNOSIS — Z803 Family history of malignant neoplasm of breast: Secondary | ICD-10-CM

## 2021-04-10 DIAGNOSIS — I890 Lymphedema, not elsewhere classified: Secondary | ICD-10-CM | POA: Diagnosis present

## 2021-04-10 DIAGNOSIS — I5032 Chronic diastolic (congestive) heart failure: Secondary | ICD-10-CM | POA: Diagnosis present

## 2021-04-10 DIAGNOSIS — Z7901 Long term (current) use of anticoagulants: Secondary | ICD-10-CM

## 2021-04-10 DIAGNOSIS — R4182 Altered mental status, unspecified: Secondary | ICD-10-CM | POA: Diagnosis not present

## 2021-04-10 DIAGNOSIS — R600 Localized edema: Secondary | ICD-10-CM | POA: Diagnosis not present

## 2021-04-10 DIAGNOSIS — I5030 Unspecified diastolic (congestive) heart failure: Secondary | ICD-10-CM

## 2021-04-10 DIAGNOSIS — Z800 Family history of malignant neoplasm of digestive organs: Secondary | ICD-10-CM

## 2021-04-10 DIAGNOSIS — M8580 Other specified disorders of bone density and structure, unspecified site: Secondary | ICD-10-CM | POA: Diagnosis present

## 2021-04-10 DIAGNOSIS — R918 Other nonspecific abnormal finding of lung field: Secondary | ICD-10-CM | POA: Diagnosis not present

## 2021-04-10 DIAGNOSIS — I5031 Acute diastolic (congestive) heart failure: Secondary | ICD-10-CM

## 2021-04-10 DIAGNOSIS — R55 Syncope and collapse: Secondary | ICD-10-CM | POA: Diagnosis present

## 2021-04-10 DIAGNOSIS — J90 Pleural effusion, not elsewhere classified: Secondary | ICD-10-CM | POA: Diagnosis not present

## 2021-04-10 DIAGNOSIS — K5900 Constipation, unspecified: Secondary | ICD-10-CM | POA: Diagnosis present

## 2021-04-10 LAB — CBC
HCT: 44.2 % (ref 36.0–46.0)
Hemoglobin: 14.8 g/dL (ref 12.0–15.0)
MCH: 28.5 pg (ref 26.0–34.0)
MCHC: 33.5 g/dL (ref 30.0–36.0)
MCV: 85.2 fL (ref 80.0–100.0)
Platelets: 412 10*3/uL — ABNORMAL HIGH (ref 150–400)
RBC: 5.19 MIL/uL — ABNORMAL HIGH (ref 3.87–5.11)
RDW: 15.3 % (ref 11.5–15.5)
WBC: 10.1 10*3/uL (ref 4.0–10.5)
nRBC: 0 % (ref 0.0–0.2)

## 2021-04-10 LAB — BASIC METABOLIC PANEL
Anion gap: 15 (ref 5–15)
BUN: 88 mg/dL — ABNORMAL HIGH (ref 8–23)
CO2: 17 mmol/L — ABNORMAL LOW (ref 22–32)
Calcium: 9.4 mg/dL (ref 8.9–10.3)
Chloride: 93 mmol/L — ABNORMAL LOW (ref 98–111)
Creatinine, Ser: 4.19 mg/dL — ABNORMAL HIGH (ref 0.44–1.00)
GFR, Estimated: 11 mL/min — ABNORMAL LOW (ref >60)
Glucose, Bld: 129 mg/dL — ABNORMAL HIGH (ref 70–99)
Potassium: 4.9 mmol/L (ref 3.5–5.1)
Sodium: 125 mmol/L — ABNORMAL LOW (ref 135–145)

## 2021-04-10 LAB — CBG MONITORING, ED: Glucose-Capillary: 136 mg/dL — ABNORMAL HIGH (ref 70–99)

## 2021-04-10 LAB — TROPONIN I (HIGH SENSITIVITY): Troponin I (High Sensitivity): 15 ng/L (ref <18)

## 2021-04-10 MED ORDER — SODIUM CHLORIDE 0.9 % IV BOLUS
500.0000 mL | Freq: Once | INTRAVENOUS | Status: AC
  Administered 2021-04-10: 500 mL via INTRAVENOUS

## 2021-04-10 NOTE — ED Notes (Signed)
Pt to ED via EMS from home c/o dizziness tonight.  Started while watching TV, denies hx of same.  States blood sugars at home have been up and down with hx of type 2 DBM.  Pt denies pain and states SOB for weeks.  EMS states initial BP of 95/56, 94% RA, 87 HR.  Pt A&Ox4, chest rise even and unlabored, in NAD at this time.

## 2021-04-11 ENCOUNTER — Inpatient Hospital Stay: Payer: Medicare PPO

## 2021-04-11 ENCOUNTER — Emergency Department: Payer: Medicare PPO

## 2021-04-11 DIAGNOSIS — I5032 Chronic diastolic (congestive) heart failure: Secondary | ICD-10-CM | POA: Diagnosis present

## 2021-04-11 DIAGNOSIS — N189 Chronic kidney disease, unspecified: Secondary | ICD-10-CM

## 2021-04-11 DIAGNOSIS — Z8262 Family history of osteoporosis: Secondary | ICD-10-CM | POA: Diagnosis not present

## 2021-04-11 DIAGNOSIS — R42 Dizziness and giddiness: Secondary | ICD-10-CM | POA: Diagnosis present

## 2021-04-11 DIAGNOSIS — M8580 Other specified disorders of bone density and structure, unspecified site: Secondary | ICD-10-CM | POA: Diagnosis present

## 2021-04-11 DIAGNOSIS — R55 Syncope and collapse: Secondary | ICD-10-CM | POA: Diagnosis present

## 2021-04-11 DIAGNOSIS — E1122 Type 2 diabetes mellitus with diabetic chronic kidney disease: Secondary | ICD-10-CM | POA: Diagnosis not present

## 2021-04-11 DIAGNOSIS — I959 Hypotension, unspecified: Secondary | ICD-10-CM | POA: Diagnosis present

## 2021-04-11 DIAGNOSIS — I9589 Other hypotension: Secondary | ICD-10-CM | POA: Diagnosis not present

## 2021-04-11 DIAGNOSIS — I4891 Unspecified atrial fibrillation: Secondary | ICD-10-CM | POA: Diagnosis present

## 2021-04-11 DIAGNOSIS — Z7984 Long term (current) use of oral hypoglycemic drugs: Secondary | ICD-10-CM | POA: Diagnosis not present

## 2021-04-11 DIAGNOSIS — I5030 Unspecified diastolic (congestive) heart failure: Secondary | ICD-10-CM

## 2021-04-11 DIAGNOSIS — J189 Pneumonia, unspecified organism: Secondary | ICD-10-CM | POA: Diagnosis present

## 2021-04-11 DIAGNOSIS — E872 Acidosis, unspecified: Secondary | ICD-10-CM

## 2021-04-11 DIAGNOSIS — E871 Hypo-osmolality and hyponatremia: Secondary | ICD-10-CM | POA: Diagnosis present

## 2021-04-11 DIAGNOSIS — N179 Acute kidney failure, unspecified: Secondary | ICD-10-CM | POA: Diagnosis present

## 2021-04-11 DIAGNOSIS — I5031 Acute diastolic (congestive) heart failure: Secondary | ICD-10-CM

## 2021-04-11 DIAGNOSIS — E861 Hypovolemia: Secondary | ICD-10-CM | POA: Diagnosis present

## 2021-04-11 DIAGNOSIS — I5033 Acute on chronic diastolic (congestive) heart failure: Secondary | ICD-10-CM

## 2021-04-11 DIAGNOSIS — M170 Bilateral primary osteoarthritis of knee: Secondary | ICD-10-CM | POA: Diagnosis present

## 2021-04-11 DIAGNOSIS — M5136 Other intervertebral disc degeneration, lumbar region: Secondary | ICD-10-CM | POA: Diagnosis present

## 2021-04-11 DIAGNOSIS — N1832 Chronic kidney disease, stage 3b: Secondary | ICD-10-CM | POA: Diagnosis present

## 2021-04-11 DIAGNOSIS — Z8249 Family history of ischemic heart disease and other diseases of the circulatory system: Secondary | ICD-10-CM | POA: Diagnosis not present

## 2021-04-11 DIAGNOSIS — E785 Hyperlipidemia, unspecified: Secondary | ICD-10-CM | POA: Diagnosis present

## 2021-04-11 DIAGNOSIS — E1022 Type 1 diabetes mellitus with diabetic chronic kidney disease: Secondary | ICD-10-CM | POA: Diagnosis present

## 2021-04-11 DIAGNOSIS — I480 Paroxysmal atrial fibrillation: Secondary | ICD-10-CM

## 2021-04-11 DIAGNOSIS — Z7901 Long term (current) use of anticoagulants: Secondary | ICD-10-CM | POA: Diagnosis not present

## 2021-04-11 DIAGNOSIS — I130 Hypertensive heart and chronic kidney disease with heart failure and stage 1 through stage 4 chronic kidney disease, or unspecified chronic kidney disease: Secondary | ICD-10-CM | POA: Diagnosis present

## 2021-04-11 DIAGNOSIS — Z20822 Contact with and (suspected) exposure to covid-19: Secondary | ICD-10-CM | POA: Diagnosis present

## 2021-04-11 DIAGNOSIS — L03119 Cellulitis of unspecified part of limb: Secondary | ICD-10-CM | POA: Diagnosis not present

## 2021-04-11 DIAGNOSIS — E1051 Type 1 diabetes mellitus with diabetic peripheral angiopathy without gangrene: Secondary | ICD-10-CM | POA: Diagnosis present

## 2021-04-11 DIAGNOSIS — I872 Venous insufficiency (chronic) (peripheral): Secondary | ICD-10-CM | POA: Diagnosis present

## 2021-04-11 DIAGNOSIS — Z9049 Acquired absence of other specified parts of digestive tract: Secondary | ICD-10-CM | POA: Diagnosis not present

## 2021-04-11 LAB — URINALYSIS, COMPLETE (UACMP) WITH MICROSCOPIC
Bilirubin Urine: NEGATIVE
Glucose, UA: NEGATIVE mg/dL
Hgb urine dipstick: NEGATIVE
Ketones, ur: NEGATIVE mg/dL
Nitrite: NEGATIVE
Protein, ur: NEGATIVE mg/dL
Specific Gravity, Urine: 1.011 (ref 1.005–1.030)
pH: 5 (ref 5.0–8.0)

## 2021-04-11 LAB — LACTIC ACID, PLASMA
Lactic Acid, Venous: 2.4 mmol/L (ref 0.5–1.9)
Lactic Acid, Venous: 1.8 mmol/L (ref 0.5–1.9)

## 2021-04-11 LAB — BLOOD GAS, VENOUS
Acid-base deficit: 8.7 mmol/L — ABNORMAL HIGH (ref 0.0–2.0)
Bicarbonate: 18.8 mmol/L — ABNORMAL LOW (ref 20.0–28.0)
O2 Saturation: 16.3 %
Patient temperature: 37
pCO2, Ven: 45 mmHg (ref 44.0–60.0)
pH, Ven: 7.23 — ABNORMAL LOW (ref 7.250–7.430)
pO2, Ven: <31 mmHg — CL (ref 32.0–45.0)

## 2021-04-11 LAB — HEPATITIS B CORE ANTIBODY, TOTAL: Hep B Core Total Ab: NONREACTIVE

## 2021-04-11 LAB — BASIC METABOLIC PANEL
Anion gap: 13 (ref 5–15)
Anion gap: 11 (ref 5–15)
Anion gap: 15 (ref 5–15)
Anion gap: 13 (ref 5–15)
Anion gap: 12 (ref 5–15)
Anion gap: 13 (ref 5–15)
BUN: 90 mg/dL — ABNORMAL HIGH (ref 8–23)
BUN: 93 mg/dL — ABNORMAL HIGH (ref 8–23)
BUN: 90 mg/dL — ABNORMAL HIGH (ref 8–23)
BUN: 89 mg/dL — ABNORMAL HIGH (ref 8–23)
BUN: 93 mg/dL — ABNORMAL HIGH (ref 8–23)
BUN: 98 mg/dL — ABNORMAL HIGH (ref 8–23)
CO2: 19 mmol/L — ABNORMAL LOW (ref 22–32)
CO2: 21 mmol/L — ABNORMAL LOW (ref 22–32)
CO2: 21 mmol/L — ABNORMAL LOW (ref 22–32)
CO2: 21 mmol/L — ABNORMAL LOW (ref 22–32)
CO2: 22 mmol/L (ref 22–32)
CO2: 22 mmol/L (ref 22–32)
Calcium: 9.2 mg/dL (ref 8.9–10.3)
Calcium: 9 mg/dL (ref 8.9–10.3)
Calcium: 9 mg/dL (ref 8.9–10.3)
Calcium: 9.1 mg/dL (ref 8.9–10.3)
Calcium: 8.9 mg/dL (ref 8.9–10.3)
Calcium: 8.9 mg/dL (ref 8.9–10.3)
Chloride: 95 mmol/L — ABNORMAL LOW (ref 98–111)
Chloride: 95 mmol/L — ABNORMAL LOW (ref 98–111)
Chloride: 90 mmol/L — ABNORMAL LOW (ref 98–111)
Chloride: 92 mmol/L — ABNORMAL LOW (ref 98–111)
Chloride: 91 mmol/L — ABNORMAL LOW (ref 98–111)
Chloride: 90 mmol/L — ABNORMAL LOW (ref 98–111)
Creatinine, Ser: 4.18 mg/dL — ABNORMAL HIGH (ref 0.44–1.00)
Creatinine, Ser: 4.06 mg/dL — ABNORMAL HIGH (ref 0.44–1.00)
Creatinine, Ser: 4.46 mg/dL — ABNORMAL HIGH (ref 0.44–1.00)
Creatinine, Ser: 4.52 mg/dL — ABNORMAL HIGH (ref 0.44–1.00)
Creatinine, Ser: 4.57 mg/dL — ABNORMAL HIGH (ref 0.44–1.00)
Creatinine, Ser: 4.75 mg/dL — ABNORMAL HIGH (ref 0.44–1.00)
GFR, Estimated: 11 mL/min — ABNORMAL LOW (ref >60)
GFR, Estimated: 11 mL/min — ABNORMAL LOW (ref >60)
GFR, Estimated: 10 mL/min — ABNORMAL LOW (ref >60)
GFR, Estimated: 10 mL/min — ABNORMAL LOW (ref >60)
GFR, Estimated: 9 mL/min — ABNORMAL LOW (ref >60)
GFR, Estimated: 9 mL/min — ABNORMAL LOW (ref >60)
Glucose, Bld: 150 mg/dL — ABNORMAL HIGH (ref 70–99)
Glucose, Bld: 153 mg/dL — ABNORMAL HIGH (ref 70–99)
Glucose, Bld: 161 mg/dL — ABNORMAL HIGH (ref 70–99)
Glucose, Bld: 143 mg/dL — ABNORMAL HIGH (ref 70–99)
Glucose, Bld: 148 mg/dL — ABNORMAL HIGH (ref 70–99)
Glucose, Bld: 128 mg/dL — ABNORMAL HIGH (ref 70–99)
Potassium: 5.5 mmol/L — ABNORMAL HIGH (ref 3.5–5.1)
Potassium: 5.5 mmol/L — ABNORMAL HIGH (ref 3.5–5.1)
Potassium: 5.1 mmol/L (ref 3.5–5.1)
Potassium: 5.1 mmol/L (ref 3.5–5.1)
Potassium: 4.5 mmol/L (ref 3.5–5.1)
Potassium: 4.1 mmol/L (ref 3.5–5.1)
Sodium: 127 mmol/L — ABNORMAL LOW (ref 135–145)
Sodium: 127 mmol/L — ABNORMAL LOW (ref 135–145)
Sodium: 126 mmol/L — ABNORMAL LOW (ref 135–145)
Sodium: 126 mmol/L — ABNORMAL LOW (ref 135–145)
Sodium: 125 mmol/L — ABNORMAL LOW (ref 135–145)
Sodium: 125 mmol/L — ABNORMAL LOW (ref 135–145)

## 2021-04-11 LAB — GLUCOSE, CAPILLARY: Glucose-Capillary: 162 mg/dL — ABNORMAL HIGH (ref 70–99)

## 2021-04-11 LAB — HEPATITIS B SURFACE ANTIGEN: Hepatitis B Surface Ag: NONREACTIVE

## 2021-04-11 LAB — PROTEIN / CREATININE RATIO, URINE
Creatinine, Urine: 139 mg/dL
Protein Creatinine Ratio: 0.19 mg/mg{Cre} — ABNORMAL HIGH (ref 0.00–0.15)
Total Protein, Urine: 27 mg/dL

## 2021-04-11 LAB — HEPATITIS C ANTIBODY: HCV Ab: NONREACTIVE

## 2021-04-11 LAB — PROCALCITONIN: Procalcitonin: <0.1 ng/mL

## 2021-04-11 LAB — TSH: TSH: 3.091 u[IU]/mL (ref 0.350–4.500)

## 2021-04-11 LAB — RESP PANEL BY RT-PCR (FLU A&B, COVID) ARPGX2
Influenza A by PCR: NEGATIVE
Influenza B by PCR: NEGATIVE
SARS Coronavirus 2 by RT PCR: NEGATIVE

## 2021-04-11 LAB — CBG MONITORING, ED
Glucose-Capillary: 146 mg/dL — ABNORMAL HIGH (ref 70–99)
Glucose-Capillary: 132 mg/dL — ABNORMAL HIGH (ref 70–99)
Glucose-Capillary: 147 mg/dL — ABNORMAL HIGH (ref 70–99)
Glucose-Capillary: 184 mg/dL — ABNORMAL HIGH (ref 70–99)

## 2021-04-11 LAB — BRAIN NATRIURETIC PEPTIDE: B Natriuretic Peptide: 94.5 pg/mL (ref 0.0–100.0)

## 2021-04-11 LAB — CREATININE, URINE, RANDOM: Creatinine, Urine: 141 mg/dL

## 2021-04-11 LAB — HEPATITIS B CORE ANTIBODY, IGM: Hep B C IgM: NONREACTIVE

## 2021-04-11 LAB — HEPATITIS B SURFACE ANTIBODY,QUALITATIVE: Hep B S Ab: NONREACTIVE

## 2021-04-11 LAB — T4, FREE: Free T4: 1.43 ng/dL — ABNORMAL HIGH (ref 0.61–1.12)

## 2021-04-11 MED ORDER — SODIUM CHLORIDE 0.9 % IV SOLN
1.0000 g | Freq: Once | INTRAVENOUS | Status: AC
  Administered 2021-04-11: 1 g via INTRAVENOUS
  Filled 2021-04-11: qty 10

## 2021-04-11 MED ORDER — SODIUM BICARBONATE 8.4 % IV SOLN
INTRAVENOUS | Status: DC
  Filled 2021-04-11: qty 1000
  Filled 2021-04-11 (×2): qty 150
  Filled 2021-04-11: qty 1000

## 2021-04-11 MED ORDER — TRAMADOL HCL 50 MG PO TABS
50.0000 mg | ORAL_TABLET | Freq: Two times a day (BID) | ORAL | Status: DC | PRN
  Administered 2021-04-12 (×2): 100 mg via ORAL
  Administered 2021-04-13: 50 mg via ORAL
  Administered 2021-04-14 (×2): 100 mg via ORAL
  Administered 2021-04-16 – 2021-04-17 (×2): 50 mg via ORAL
  Filled 2021-04-11 (×4): qty 2
  Filled 2021-04-11 (×2): qty 1
  Filled 2021-04-11: qty 2
  Filled 2021-04-11: qty 1

## 2021-04-11 MED ORDER — LACTATED RINGERS IV SOLN: INTRAVENOUS | Status: DC

## 2021-04-11 MED ORDER — SODIUM BICARBONATE 8.4 % IV SOLN
50.0000 meq | Freq: Once | INTRAVENOUS | Status: AC
  Administered 2021-04-11: 50 meq via INTRAVENOUS
  Filled 2021-04-11: qty 50

## 2021-04-11 MED ORDER — INSULIN ASPART 100 UNIT/ML IJ SOLN
0.0000 [IU] | Freq: Three times a day (TID) | INTRAMUSCULAR | Status: DC
  Administered 2021-04-11: 2 [IU] via SUBCUTANEOUS
  Administered 2021-04-11: 1 [IU] via SUBCUTANEOUS
  Administered 2021-04-11: 2 [IU] via SUBCUTANEOUS
  Administered 2021-04-11: 1 [IU] via SUBCUTANEOUS
  Administered 2021-04-12: 2 [IU] via SUBCUTANEOUS
  Administered 2021-04-12: 5 [IU] via SUBCUTANEOUS
  Administered 2021-04-12: 2 [IU] via SUBCUTANEOUS
  Administered 2021-04-12: 1 [IU] via SUBCUTANEOUS
  Administered 2021-04-13: 2 [IU] via SUBCUTANEOUS
  Administered 2021-04-13: 1 [IU] via SUBCUTANEOUS
  Administered 2021-04-13: 3 [IU] via SUBCUTANEOUS
  Administered 2021-04-14 (×2): 1 [IU] via SUBCUTANEOUS
  Administered 2021-04-14: 3 [IU] via SUBCUTANEOUS
  Administered 2021-04-15 – 2021-04-18 (×4): 2 [IU] via SUBCUTANEOUS
  Administered 2021-04-18: 1 [IU] via SUBCUTANEOUS
  Filled 2021-04-11 (×19): qty 1

## 2021-04-11 MED ORDER — APIXABAN 5 MG PO TABS
5.0000 mg | ORAL_TABLET | Freq: Two times a day (BID) | ORAL | Status: DC
  Administered 2021-04-11 – 2021-04-18 (×15): 5 mg via ORAL
  Filled 2021-04-11 (×15): qty 1

## 2021-04-11 MED ORDER — SODIUM CHLORIDE 0.9 % IV SOLN
500.0000 mg | Freq: Once | INTRAVENOUS | Status: AC
  Administered 2021-04-11: 500 mg via INTRAVENOUS
  Filled 2021-04-11: qty 500

## 2021-04-11 NOTE — ED Provider Notes (Signed)
[facility] Emergency Department Provider Note   ____________________________________________   Event Date/Time   First MD Initiated Contact with Patient 04/10/21 2319     (approximate)  I have reviewed the triage vital signs and the nursing notes.   HISTORY  Chief Complaint Dizziness    HPI Karen Farley is a 75 y.o. female who presents to the ED from home with a chief complaint of dizziness. Patient reports progressive deconditioning since April. Endorses generalized malaise, generalized weakness, lightheaded/dizzy, decreased PO intake. Syncopal episode at home. Denies fever, cough, chest pain, sob, abdominal pain, nausea or vomiting.      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)    DDD (degenerative disc disease), lumbar    Degenerative disc disease, lumbar    bulging and dengerated   Diabetes mellitus without complication ([place])    Hyperlipidemia    Hypertension    Osteoarthritis of both knees    Osteopenia     Patient Active Problem List   Diagnosis Date Noted   Hypotension 04/11/2021   Acute kidney injury superimposed on CKD ([place]) 04/11/2021   Hyponatremia 86/57/8469   Metabolic acidosis 62/95/2841   (HFpEF) heart failure with preserved ejection fraction ([place]) 04/11/2021   Chronic venous insufficiency 08/14/2020   PAD (peripheral artery disease) ([place]) 08/14/2020   Bilateral primary osteoarthritis of knee 04/13/2020   Type 2 diabetes mellitus ([place]) 04/13/2020   Acquired spondylolisthesis 12/17/2019   Osteoarthritis of knee 12/17/2019   Lumbar radiculopathy 12/17/2019   New onset atrial fibrillation ([place]) 11/20/2019   Atrial fibrillation ([place]) 11/20/2019   Closed fracture of radial styloid 08/06/2019   Displaced fracture of unspecified radial styloid process, initial encounter for closed fracture 08/06/2019   Controlled substance agreement signed 01/24/2018   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[place]) 01/24/2018   Piriformis syndrome 07/01/2016   Sacroiliac joint dysfunction 01/09/2016   Joint disorder, unspecified 01/09/2016   Low back pain 01/09/2016   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 diabetes mellitus ([place]) 04/28/2015   Chronic kidney disease, stage 3 unspecified ([place]) 04/28/2015   Lymphedema 03/27/2015   Edema 03/27/2015   Open-angle glaucoma, mild stage    Osteoarthritis of both knees    Type 2 diabetes mellitus with renal complication (HCC)    Osteopenia    Benign hypertensive renal disease    Hyperlipidemia    Degeneration of lumbosacral intervertebral disc     Past Surgical History:  Procedure Laterality Date   CHOLECYSTECTOMY     DILATION AND CURETTAGE OF UTERUS     TEAR DUCT PROBING WITH STRABISMUS REPAIR Right    TONSILLECTOMY      Prior to Admission medications   Medication Sig Start Date End Date Taking? Authorizing Provider  ACCU-CHEK GUIDE test strip USE TO CHECK BLOOD SUGAR DAILY 12/18/20  Yes Johnson, [name], DO  benazepril (LOTENSIN) 20 MG tablet Take 1 tablet (20 mg total) by mouth daily. 01/15/21  Yes McElwee, Scheryl Darter, NP  Elastic Bandages & Supports ([place]) MISC by Does not apply route.   Yes Johnson, [name], DO  latanoprost (XALATAN) 0.005 % ophthalmic solution Place 1 drop into both eyes at bedtime.   Yes [provider]  metFORMIN (GLUCOPHAGE-XR) 500 MG 24 hr tablet Take 2 tabs in the AM and 1 tab in the PM Patient taking differently: Take 1,000 mg by mouth 2 (two) times daily. Take 2 tabs in the  AM and 2 tab in the PM 01/15/21  Yes McElwee, Lauren A, NP  metoprolol succinate (TOPROL-XL) 50 MG 24 hr tablet Take 1 tablet (50 mg total) by mouth daily. Take with or immediately following a meal. 10/09/20 04/11/21 Yes Johnson, [name], DO  Multiple Vitamin (MULTIVITAMIN WITH MINERALS) TABS tablet Take 1 tablet by mouth daily.   Yes [provider]  torsemide (DEMADEX) 20 MG tablet Take 1-2  tablets (20-40 mg total) by mouth daily. Only take 2 for 1-3 days and if persists call with bad swelling 02/07/21  Yes Agbor-Etang, Aaron Edelman, MD  traMADol (ULTRAM) 50 MG tablet Take 1-2 tablets (50-100 mg total) by mouth every 6 (six) hours as needed. 01/10/20  Yes Johnson, [name], DO  Vitamin D, Cholecalciferol, 50 MCG (2000 UT) CAPS Take 1 tablet by mouth.   Yes [provider]  XARELTO 20 MG TABS tablet TAKE 1 TABLET(20 MG) BY MOUTH DAILY WITH SUPPER 11/02/20  Yes Johnson, [name], DO    Allergies Patient has no known allergies.  Family History  Problem Relation Age of Onset   Heart disease Mother        CHF   Osteoporosis Mother    Breast cancer Mother    Heart disease Father 56   Cancer Brother        Colon CA- 2002- Youngest brother   Parkinson's disease Brother        Younger Brother   Heart disease Brother        2 brothers   Breast cancer Maternal Grandmother     Social History Social History   Tobacco Use   Smoking status: Never   Smokeless tobacco: Never  Vaping Use   Vaping Use: Never used  Substance Use Topics   Alcohol use: Not Currently    Alcohol/week: 0.0 standard drinks    Comment: on rare occasion   Drug use: No    Review of Systems  Constitutional: Positive for generalized weakness, decreased oral intake.  No fever/chills Eyes: No visual changes. ENT: No sore throat. Cardiovascular: Denies chest pain. Respiratory: Denies shortness of breath. Gastrointestinal: No abdominal pain.  No nausea, no vomiting.  No diarrhea.  No constipation. Genitourinary: Negative for dysuria. Musculoskeletal: Negative for back pain. Skin: Negative for rash. Neurological: Positive for dizziness and syncope.  Negative for headaches, focal weakness or numbness.   ____________________________________________   PHYSICAL EXAM:  VITAL SIGNS: ED Triage Vitals  Enc Vitals Group     BP 04/10/21 2259 (!) 85/57     Pulse Rate 04/10/21 2259 81     Resp 04/10/21  2259 16     Temp 04/10/21 2259 97.7 F (36.5 C)     Temp Source 04/10/21 2259 Oral     SpO2 04/10/21 2259 95 %     Weight 04/10/21 2246 215 lb (97.5 kg)     Height 04/10/21 2246 5\' 2"  (1.575 m)     Head Circumference --      Peak Flow --      Pain Score 04/10/21 2246 0     Pain Loc --      Pain Edu? --      Excl. in [place]? --     Constitutional: Alert and oriented.  Elderly appearing and in mild acute distress. Eyes: Conjunctivae are normal. PERRL. EOMI. Head: Atraumatic. Nose: No congestion/rhinnorhea. Mouth/Throat: Mucous membranes are mildly dry. Neck: No stridor.   Cardiovascular: Normal rate, regular rhythm. Grossly normal heart sounds.  Good peripheral circulation.  Respiratory: Normal respiratory effort.  No retractions. Lungs CTAB. Gastrointestinal: Soft and nontender. No distention. No abdominal bruits. No CVA tenderness. Musculoskeletal: No lower extremity tenderness nor edema.  No joint effusions. Neurologic:  Normal speech and language. No gross focal neurologic deficits are appreciated.  Skin:  Skin is warm, dry and intact. No rash noted. Psychiatric: Mood and affect are normal. Speech and behavior are normal.  ____________________________________________   LABS (all labs ordered are listed, but only abnormal results are displayed)  Labs Reviewed  BASIC METABOLIC PANEL - Abnormal; Notable for the following components:      Result Value   Sodium 125 (*)    Chloride 93 (*)    CO2 17 (*)    Glucose, Bld 129 (*)    BUN 88 (*)    Creatinine, Ser 4.19 (*)    GFR, Estimated 11 (*)    All other components within normal limits  CBC - Abnormal; Notable for the following components:   RBC 5.19 (*)    Platelets 412 (*)    All other components within normal limits  T4, FREE - Abnormal; Notable for the following components:   Free T4 1.43 (*)    All other components within normal limits  LACTIC ACID, PLASMA - Abnormal; Notable for the following components:   Lactic  Acid, Venous 2.4 (*)    All other components within normal limits  BASIC METABOLIC PANEL - Abnormal; Notable for the following components:   Sodium 127 (*)    Potassium 5.5 (*)    Chloride 95 (*)    CO2 19 (*)    Glucose, Bld 150 (*)    BUN 90 (*)    Creatinine, Ser 4.18 (*)    GFR, Estimated 11 (*)    All other components within normal limits  BLOOD GAS, VENOUS - Abnormal; Notable for the following components:   pH, Ven 7.23 (*)    pO2, Ven <31.0 (*)    Bicarbonate 18.8 (*)    Acid-base deficit 8.7 (*)    All other components within normal limits  CBG MONITORING, ED - Abnormal; Notable for the following components:   Glucose-Capillary 136 (*)    All other components within normal limits  RESP PANEL BY RT-PCR (FLU A&B, COVID) ARPGX2  CULTURE, BLOOD (ROUTINE X 2)  CULTURE, BLOOD (ROUTINE X 2)  BRAIN NATRIURETIC PEPTIDE  TSH  LACTIC ACID, PLASMA  PROCALCITONIN  URINALYSIS, COMPLETE (UACMP) WITH MICROSCOPIC  BASIC METABOLIC PANEL  BASIC METABOLIC PANEL  BASIC METABOLIC PANEL  BASIC METABOLIC PANEL  BASIC METABOLIC PANEL  CREATININE, URINE, RANDOM  UREA NITROGEN, URINE  TROPONIN I (HIGH SENSITIVITY)   ____________________________________________  EKG  ED ECG REPORT I, Abuk Selleck J, the attending physician, personally viewed and interpreted this ECG.   Date: 04/10/2021  EKG Time: 2252  Rate: 86  Rhythm: atrial fibrillation, rate 86  Axis: Normal  Intervals:none  ST&T Change: Nonspecific  ____________________________________________  RADIOLOGY I, Emery Binz J, personally viewed and evaluated these images (plain radiographs) as part of my medical decision making, as well as reviewing the written report by the radiologist.  ED MD interpretation: No ICH, left basilar infiltrate  Official radiology report(s): CT Head Wo Contrast  Result Date: 04/11/2021 CLINICAL DATA:  Mental status change. EXAM: CT HEAD WITHOUT CONTRAST TECHNIQUE: Contiguous axial images were  obtained from the base of the skull through the vertex without intravenous contrast. COMPARISON:  05/27/2009 FINDINGS: Brain: No evidence of acute infarction, hemorrhage, hydrocephalus, extra-axial collection or mass lesion/mass  effect. There is patchy low-attenuation within the subcortical and periventricular white matter compatible with chronic microvascular disease. Prominence of the sulci and ventricles compatible with brain atrophy. Vascular: No hyperdense vessel or unexpected calcification. Skull: Normal. Negative for fracture or focal lesion. Sinuses/Orbits: No acute finding. Other: None IMPRESSION: 1. No acute intracranial abnormalities. 2. Chronic small vessel ischemic change and brain atrophy. Electronically Signed   By: Kerby Moors M.D.   On: 04/11/2021 00:31   DG Chest Port 1 View  Result Date: 04/10/2021 CLINICAL DATA:  Dizziness EXAM: PORTABLE CHEST 1 VIEW COMPARISON:  None FINDINGS: Cardiac shadow is enlarged. Right lung is clear. Left lung demonstrates basilar opacity with small effusion. No bony abnormality is seen. IMPRESSION: Left basilar infiltrate with associated small effusion. Electronically Signed   By: Inez Catalina M.D.   On: 04/10/2021 23:53    ____________________________________________   PROCEDURES  Procedure(s) performed (including Critical Care):  .1-3 Lead EKG Interpretation  Date/Time: 04/10/2021 11:53 PM Performed by: Paulette Blanch, MD Authorized by: Paulette Blanch, MD     Interpretation: abnormal     ECG rate:  85   ECG rate assessment: normal     Rhythm: atrial fibrillation     Ectopy: none     Conduction: normal   Comments:     Patient placed on cardiac monitor to evaluate for arrhythmias   CRITICAL CARE Performed by: Paulette Blanch   Total critical care time: 30 minutes  Critical care time was exclusive of separately billable procedures and treating other patients.  Critical care was necessary to treat or prevent imminent or life-threatening  deterioration.  Critical care was time spent personally by me on the following activities: development of treatment plan with patient and/or surrogate as well as nursing, discussions with consultants, evaluation of patient's response to treatment, examination of patient, obtaining history from patient or surrogate, ordering and performing treatments and interventions, ordering and review of laboratory studies, ordering and review of radiographic studies, pulse oximetry and re-evaluation of patient's condition.  ____________________________________________   INITIAL IMPRESSION / ASSESSMENT AND PLAN / ED COURSE  As part of my medical decision making, I reviewed the following data within the [place] History obtained from family, Nursing notes reviewed and incorporated, Labs reviewed, EKG interpreted, Old chart reviewed, Radiograph reviewed, Discussed with admitting physician, and Notes from prior ED visits     75 year old female presenting with generalized weakness, dizziness, syncopal episode differential diagnosis includes but is not limited to [place], CVA, ACS, metabolic, infectious etiologies, etc.  Laboratory results demonstrate hyponatremia and acute on chronic renal failure new from 2 months ago.  Will administer judicious fluids.  Obtain CT head, chest x-ray.  Anticipate hospitalization.  Clinical Course as of 04/11/21 0335  Tue Apr 10, 2021  2351 SBP 102 at the time of interview and examination. [JS]  Wed Apr 11, 2021  0141 Lactic acid noted.  Patient does not meet sepsis criteria by vital signs.  Already receiving IV antibiotics. [JS]    Clinical Course User Index [JS] Paulette Blanch, MD     ____________________________________________   FINAL CLINICAL IMPRESSION(S) / ED DIAGNOSES  Final diagnoses:  Dizziness  Acute renal failure, unspecified acute renal failure type ([place])  Hyponatremia  Syncope, unspecified syncope type  Community acquired pneumonia of left  lower lobe of lung  SIRS (systemic inflammatory response syndrome) [facility])     ED Discharge Orders     None        Note:  This document was prepared using Dragon voice recognition software and may include unintentional dictation errors.    Paulette Blanch, MD 04/11/21 [phone]

## 2021-04-11 NOTE — ED Notes (Signed)
Critical result called by lab. Lactic Acid 2.4 Dr. Beather Arbour notified.

## 2021-04-11 NOTE — ED Notes (Signed)
Pt requesting to sit up, assisted from bed to recliner with legs propped up.  Pt comfortable at this time.

## 2021-04-11 NOTE — Progress Notes (Signed)
Patient ID: Karen Farley, female   DOB: November 23, 1945, 75 y.o.   MRN: [id number] This is a no charge note as patient was admitted this AM.  Karen Farley is a 75 y.o. female with medical history significant for HFpEF (EF 60-65% on echo 11/2019), atrial fibrillation on Xarelto, chronic venous insufficiency, PAD, type 2 diabetes, CKD stage IIIb who presents with concerns of presyncopal episode.  Found with AKI on top of chronic kidney disease.  Hypotensive.  Pt seen and examined, h/p reviewed.  Cta no rales Regular s1/s2 no gallops Soft benign +bs No edema   A/p:  Continue iv fluid  Hold bp meds Nephrology consulted

## 2021-04-11 NOTE — Progress Notes (Signed)
[facility]  ROUNDING NOTE   Subjective:   Karen Farley was admitted to [facility] on 04/10/2021 for Hypotension [I95.9]  Patient endorses a syncopal episode. She has not been eating well. Patient continued to take her torsemide and benezapril. She states her glucose readings have been at goal.   Patient denies any nausea, vomiting, diarrhea. Denies use of nonsteroidal anti-inflammatory agents.    Objective:  Vital signs in last 24 hours:  Temp:  [97.7 F (36.5 C)] 97.7 F (36.5 C) (06/21 2259) Pulse Rate:  [52-92] 81 (06/22 0920) Resp:  [16-31] 22 (06/22 0920) BP: (81-161)/(45-86) 161/59 (06/22 0920) SpO2:  [92 %-97 %] 95 % (06/22 0920) Weight:  [97.5 kg] 97.5 kg (06/21 2246)  Weight change:  Filed Weights   04/10/21 2246  Weight: 97.5 kg    Intake/Output: I/O last 3 completed shifts: In: 850 [IV Piggyback:850] Out: -    Intake/Output this shift:  Total I/O In: 180 [P.O.:180] Out: -   Physical Exam: General: NAD, sitting in chair  Head: Normocephalic, atraumatic. Moist oral mucosal membranes  Eyes: Anicteric, PERRL  Neck: Supple, trachea midline  Lungs:  Clear to auscultation  Heart: Regular rate and rhythm  Abdomen:  Soft, nontender, obese  Extremities:  no peripheral edema.  Neurologic: Nonfocal, moving all four extremities  Skin: No lesions        Basic Metabolic Panel: Recent Labs  Lab 04/10/21 2258 04/11/21 0210 04/11/21 0600 04/11/21 0928  NA 125* 127* 127* 126*  K 4.9 5.5* 5.5* 5.1  CL 93* 95* 95* 90*  CO2 17* 19* 21* 21*  GLUCOSE 129* 150* 153* 161*  BUN 88* 90* 93* 90*  CREATININE 4.19* 4.18* 4.06* 4.46*  CALCIUM 9.4 9.2 9.0 9.0    Liver Function Tests: No results for input(s): AST, ALT, ALKPHOS, BILITOT, PROT, ALBUMIN in the last 168 hours. No results for input(s): LIPASE, AMYLASE in the last 168 hours. No results for input(s): AMMONIA in the last 168 hours.  CBC: Recent Labs  Lab 04/10/21 2258  WBC 10.1  HGB  14.8  HCT 44.2  MCV 85.2  PLT 412*    Cardiac Enzymes: No results for input(s): CKTOTAL, CKMB, CKMBINDEX, TROPONINI in the last 168 hours.  BNP: Invalid input(s): POCBNP  CBG: Recent Labs  Lab 04/10/21 2256 04/11/21 0735  GLUCAP 136* 146*    Microbiology: Results for orders placed or performed during the hospital encounter of 04/10/21  Resp Panel by RT-PCR (Flu A&B, Covid) Nasopharyngeal Swab     Status: None   Collection Time: 04/11/21 12:12 AM   Specimen: Nasopharyngeal Swab; Nasopharyngeal(NP) swabs in vial transport medium  Result Value Ref Range Status   SARS Coronavirus 2 by RT PCR NEGATIVE NEGATIVE Final    Comment: (NOTE) SARS-CoV-2 target nucleic acids are NOT DETECTED.  The SARS-CoV-2 RNA is generally detectable in upper respiratory specimens during the acute phase of infection. The lowest concentration of SARS-CoV-2 viral copies this assay can detect is 138 copies/mL. A negative result does not preclude SARS-Cov-2 infection and should not be used as the sole basis for treatment or other patient management decisions. A negative result may occur with  improper specimen collection/handling, submission of specimen other than nasopharyngeal swab, presence of viral mutation(s) within the areas targeted by this assay, and inadequate number of viral copies(<138 copies/mL). A negative result must be combined with clinical observations, patient history, and epidemiological information. The expected result is Negative.  Fact Sheet for Patients:  EntrepreneurPulse.com.au  Fact  Sheet for Healthcare Providers:  IncredibleEmployment.be  This test is no t yet approved or cleared by the [place] FDA and  has been authorized for detection and/or diagnosis of SARS-CoV-2 by FDA under an Emergency Use Authorization (EUA). This EUA will remain  in effect (meaning this test can be used) for the duration of the COVID-19 declaration under  Section 564(b)(1) of the Act, 21 U.S.C.section 360bbb-3(b)(1), unless the authorization is terminated  or revoked sooner.       Influenza A by PCR NEGATIVE NEGATIVE Final   Influenza B by PCR NEGATIVE NEGATIVE Final    Comment: (NOTE) The Xpert Xpress SARS-CoV-2/FLU/RSV plus assay is intended as an aid in the diagnosis of influenza from Nasopharyngeal swab specimens and should not be used as a sole basis for treatment. Nasal washings and aspirates are unacceptable for Xpert Xpress SARS-CoV-2/FLU/RSV testing.  Fact Sheet for Patients: EntrepreneurPulse.com.au  Fact Sheet for Healthcare Providers: IncredibleEmployment.be  This test is not yet approved or cleared by the [place] FDA and has been authorized for detection and/or diagnosis of SARS-CoV-2 by FDA under an Emergency Use Authorization (EUA). This EUA will remain in effect (meaning this test can be used) for the duration of the COVID-19 declaration under Section 564(b)(1) of the Act, 21 U.S.C. section 360bbb-3(b)(1), unless the authorization is terminated or revoked.  Performed at [facility], [address] [place], [place] [address]   Culture, blood (routine x 2)     Status: None (Preliminary result)   Collection Time: 04/11/21 12:57 AM   Specimen: Left Antecubital; Blood  Result Value Ref Range Status   Specimen Description LEFT ANTECUBITAL  Final   Special Requests   Final    BOTTLES DRAWN AEROBIC AND ANAEROBIC Blood Culture adequate volume   Culture   Final    NO GROWTH < 12 HOURS Performed at [facility], [address] [place], [place] [address]    Report Status PENDING  Incomplete  Culture, blood (routine x 2)     Status: None (Preliminary result)   Collection Time: 04/11/21 12:57 AM   Specimen: Left Antecubital; Blood  Result Value Ref Range Status   Specimen Description LEFT ANTECUBITAL  Final   Special Requests   Final    BOTTLES DRAWN  AEROBIC AND ANAEROBIC Blood Culture adequate volume   Culture   Final    NO GROWTH < 12 HOURS Performed at [facility], [address] [place], [place] [address]    Report Status PENDING  Incomplete    Coagulation Studies: No results for input(s): LABPROT, INR in the last 72 hours.  Urinalysis: No results for input(s): COLORURINE, LABSPEC, PHURINE, GLUCOSEU, HGBUR, BILIRUBINUR, KETONESUR, PROTEINUR, UROBILINOGEN, NITRITE, LEUKOCYTESUR in the last 72 hours.  Invalid input(s): APPERANCEUR    Imaging: CT Head Wo Contrast  Result Date: 04/11/2021 CLINICAL DATA:  Mental status change. EXAM: CT HEAD WITHOUT CONTRAST TECHNIQUE: Contiguous axial images were obtained from the base of the skull through the vertex without intravenous contrast. COMPARISON:  05/27/2009 FINDINGS: Brain: No evidence of acute infarction, hemorrhage, hydrocephalus, extra-axial collection or mass lesion/mass effect. There is patchy low-attenuation within the subcortical and periventricular white matter compatible with chronic microvascular disease. Prominence of the sulci and ventricles compatible with brain atrophy. Vascular: No hyperdense vessel or unexpected calcification. Skull: Normal. Negative for fracture or focal lesion. Sinuses/Orbits: No acute finding. Other: None IMPRESSION: 1. No acute intracranial abnormalities. 2. Chronic small vessel ischemic change and brain atrophy. Electronically Signed   By:  Kerby Moors M.D.   On: 04/11/2021 00:31   US RENAL  Result Date: 04/11/2021 CLINICAL DATA:  Acute kidney injury. EXAM: RENAL / URINARY TRACT ULTRASOUND COMPLETE COMPARISON:  05/08/2020 FINDINGS: Right Kidney: Renal measurements: 10.0 x 5.0 x 4.7 cm = volume: 123.2. mL. Echogenicity within normal limits. No mass or hydronephrosis visualized. Left Kidney: Renal measurements: 10.7 x 6.4 x 6.1 cm. = volume: 219.7 mL. Echogenicity within normal limits. No mass or hydronephrosis visualized. Bladder: Incompletely  distended.  No focal abnormality. Other: Left pleural effusion. IMPRESSION: 1. No acute abnormality. No hydronephrosis identified. 2. Left pleural effusion. Electronically Signed   By: Kerby Moors M.D.   On: 04/11/2021 03:56   DG Chest Port 1 View  Result Date: 04/10/2021 CLINICAL DATA:  Dizziness EXAM: PORTABLE CHEST 1 VIEW COMPARISON:  None FINDINGS: Cardiac shadow is enlarged. Right lung is clear. Left lung demonstrates basilar opacity with small effusion. No bony abnormality is seen. IMPRESSION: Left basilar infiltrate with associated small effusion. Electronically Signed   By: Inez Catalina M.D.   On: 04/10/2021 23:53     Medications:    lactated ringers 75 mL/hr at 04/11/21 0241    apixaban  5 mg Oral BID   insulin aspart  0-9 Units Subcutaneous TID PC,HS,0200   traMADol  Assessment/ Plan:  Ms. ADYA WIRZ is a 75 y.o. white female with diabetes mellitus type II noninsulin dependent, hypertension, hyperlipidemia, atrial fibrillation, peripheral vascular disease who is admitted to [facility] on 04/10/2021 for Hypotension [I95.9]  Acute Kidney Injury on chronic kidney disease stage IIIB: with history of bland urine. Baseline creatinine of 1.62, GFR of 33 on 01/15/2021. Followed by [facility], Dr. Candiss Norse. Hyponatremia: baseline 139 on 03/16/21.  Metabolic acidosis: gap acidosis 15.  Diabetes mellitus type II with renal manifestations. Hemoglobin A1c of 7.3% on 01/15/21.   History is consistent with prerenal azotemia. Ultrasound without obstruction. No IV contrast exposure.  - Will change fluids to sodium bicarbonate.  - No indication for dialysis.  - Holding home torsemide, benazepril and metformin.    LOS: 0 Jiali Linney 6/22/202211:07 AM

## 2021-04-11 NOTE — ED Notes (Signed)
Pt tolerated getting up and going to the bathroom.

## 2021-04-11 NOTE — ED Notes (Addendum)
Pt assisted to bedside toilet needing to have BM.  One assist needed.  Unable to obtain urine sample d/t contaminated specimen at this time.

## 2021-04-11 NOTE — H&P (Signed)
History and Physical    [name] NLG:[id number] DOB: December 26, 1945 DOA: 04/10/2021  PCP: Valerie Roys, DO  Patient coming from: Home, husband at bedside  I have personally briefly reviewed patient's old medical records in [place]  Chief Complaint: pre-syncopal episode  HPI: Karen Farley is a 75 y.o. female with medical history significant for HFpEF (EF 60-65% on echo 11/2019), atrial fibrillation on Xarelto, chronic venous insufficiency, PAD, type 2 diabetes, CKD stage IIIb who presents with concerns of presyncopal episode.  Husband at bedside helps provide history.  Patient reports that she was sitting on her couch this evening when she told her husband that she felt faint.  He then looked over and saw that she was tremulous and that her eyes "looked funny" and decided to bring her to ED for evaluation.  Denies previous history of syncopal episode although she has been feeling lightheaded and dizzy recently.  She notes that she has not been feeling well since April.  She feels very fatigue and has "difficulty functioning". It takes a lot out of her just to get from her bedroom into the living room.  She has felt too tired to even eat and has decrease appetite.  States she has lost maybe 15 pounds since April.  States she has been having fluctuating glucose in the 100s up to 140.  She also recently has taken more doses of her metformin to try and control her sugar. She takes Torsemide daily but in the past had to have it adjusted by nephrology due to worsening renal function. She also notes intermittent cough and runny nose for the past 3 months.  No fever.  ED Course: She was initially hypotensive down to systolic of 08X over 44Y.  No leukocytosis or anemia.  Platelet of 412.  Sodium of 125.  BG of 129.  Potassium of 4.9.  Creatinine of 4.19 from a prior of 1.62.  CO2 low at 17.  Chloride of 93.  Gap of 15.  Chest x-ray shows possible left lower lobe opacity.  She was  started on IV Rocephin and azithromycin as well as 500 cc bolus and hospitalist was called for admission.  Review of Systems: Constitutional: No Weight Change, No Fever ENT/Mouth: No sore throat, No Rhinorrhea Eyes: No Eye Pain, No Vision Changes Cardiovascular: No Chest Pain, no SOB,No Edema Respiratory: + Cough, No Sputum, No Wheezing, no Dyspnea  Gastrointestinal: No Nausea, No Vomiting, No Diarrhea, No Constipation, No Pain Genitourinary: no Urinary Incontinence, No Urgency, No Flank Pain Musculoskeletal: No Arthralgias, No Myalgias Skin: No Skin Lesions, No Pruritus, Neuro: no Weakness, No Numbness,  No Loss of Consciousness, + Syncope Psych: No Anxiety/Panic, No Depression, no decrease appetite Heme/Lymph: No Bruising, No Bleeding  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 She has never used smokeless tobacco. She reports previous alcohol use. She reports  that she does not use drugs. Social History  No Known Allergies  Family History  Problem Relation Age of Onset   Heart disease Mother        CHF   Osteoporosis Mother    Breast cancer Mother    Heart disease Father 17   Cancer Brother        Colon CA- 2002- Youngest brother   Parkinson's disease Brother        Younger Brother   Heart disease Brother        2 brothers   Breast cancer Maternal Grandmother      Prior to Admission medications   Medication Sig Start Date End Date Taking? Authorizing Provider   ACCU-CHEK GUIDE test strip USE TO CHECK BLOOD SUGAR DAILY 12/18/20  Yes Johnson, [name], DO  benazepril (LOTENSIN) 20 MG tablet Take 1 tablet (20 mg total) by mouth daily. 01/15/21  Yes McElwee, Scheryl Darter, NP  Elastic Bandages & Supports ([place]) MISC by Does not apply route.   Yes Johnson, [name], DO  latanoprost (XALATAN) 0.005 % ophthalmic solution Place 1 drop into both eyes at bedtime.   Yes [provider]  metFORMIN (GLUCOPHAGE-XR) 500 MG 24 hr tablet Take 2 tabs in the AM and 1 tab in the PM Patient taking differently: Take 1,000 mg by mouth 2 (two) times daily. Take 2 tabs in the AM and 2 tab in the PM 01/15/21  Yes McElwee, Lauren A, NP  metoprolol succinate (TOPROL-XL) 50 MG 24 hr tablet Take 1 tablet (50 mg total) by mouth daily. Take with or immediately following a meal. 10/09/20 04/11/21 Yes Johnson, [name], DO  Multiple Vitamin (MULTIVITAMIN WITH MINERALS) TABS tablet Take 1 tablet by mouth daily.   Yes [provider]  torsemide (DEMADEX) 20 MG tablet Take 1-2 tablets (20-40 mg total) by mouth daily. Only take 2 for 1-3 days and if persists call with bad swelling 02/07/21  Yes Agbor-Etang, Aaron Edelman, MD  traMADol (ULTRAM) 50 MG tablet Take 1-2 tablets (50-100 mg total) by mouth every 6 (six) hours as needed. 01/10/20  Yes Johnson, [name], DO  Vitamin D, Cholecalciferol, 50 MCG (2000 UT) CAPS Take 1 tablet by mouth.   Yes [provider]  XARELTO 20 MG TABS tablet TAKE 1 TABLET(20 MG) BY MOUTH DAILY WITH SUPPER 11/02/20  Yes Valerie Roys, DO    Physical Exam: Vitals:   04/10/21 2330 04/10/21 2345 04/11/21 0000 04/11/21 0030  BP: 102/74  (!) 81/53 93/77  Pulse: 84 92 82   Resp: (!) 25 16 (!) 28   Temp:      TempSrc:      SpO2: 97% 94% 96%   Weight:      Height:        Constitutional: NAD, calm, comfortable, obesity fatigued appearing female laying at 40 degree incline in bed Vitals:   04/10/21 2330 04/10/21 2345 04/11/21 0000  04/11/21 0030  BP: 102/74  (!) 81/53 93/77  Pulse: 84 92 82   Resp: (!) 25 16 (!) 28   Temp:      TempSrc:      SpO2: 97% 94% 96%   Weight:      Height:       Eyes: PERRL, lids and conjunctivae normal ENMT: Mucous membranes are moist.  Neck: normal, supple Respiratory: clear to auscultation bilaterally, no wheezing, no crackles. Normal respiratory effort. No accessory muscle use.  Cardiovascular: Regular rate and rhythm, no murmurs /  rubs / gallops. No extremity edema. 2+ pedal pulses.  Abdomen: no tenderness, no masses palpated.  Bowel sounds positive. No CVA tenderness. Musculoskeletal: no clubbing / cyanosis. No joint deformity upper and lower extremities. Good ROM, no contractures. Normal muscle tone.  Skin: no rashes, lesions, ulcers. No induration Neurologic: CN 2-12 grossly intact. Sensation intact, Strength 5/5 in all 4.  Psychiatric: Normal judgment and insight. Alert and oriented x 3. Flat affect.    Labs on Admission: I have personally reviewed following labs and imaging studies  CBC: Recent Labs  Lab 04/10/21 2258  WBC 10.1  HGB 14.8  HCT 44.2  MCV 85.2  PLT 536*   Basic Metabolic Panel: Recent Labs  Lab 04/10/21 2258  NA 125*  K 4.9  CL 93*  CO2 17*  GLUCOSE 129*  BUN 88*  CREATININE 4.19*  CALCIUM 9.4   GFR: Estimated Creatinine Clearance: 12.7 mL/min (A) (by C-G formula based on SCr of 4.19 mg/dL (H)). Liver Function Tests: No results for input(s): AST, ALT, ALKPHOS, BILITOT, PROT, ALBUMIN in the last 168 hours. No results for input(s): LIPASE, AMYLASE in the last 168 hours. No results for input(s): AMMONIA in the last 168 hours. Coagulation Profile: No results for input(s): INR, PROTIME in the last 168 hours. Cardiac Enzymes: No results for input(s): CKTOTAL, CKMB, CKMBINDEX, TROPONINI in the last 168 hours. BNP (last 3 results) No results for input(s): PROBNP in the last 8760 hours. HbA1C: No results for input(s): HGBA1C in the last 72  hours. CBG: Recent Labs  Lab 04/10/21 2256  GLUCAP 136*   Lipid Profile: No results for input(s): CHOL, HDL, LDLCALC, TRIG, CHOLHDL, LDLDIRECT in the last 72 hours. Thyroid Function Tests: Recent Labs    04/10/21 2258  TSH 3.091  FREET4 1.43*   Anemia Panel: No results for input(s): VITAMINB12, FOLATE, FERRITIN, TIBC, IRON, RETICCTPCT in the last 72 hours. Urine analysis:    Component Value Date/Time   APPEARANCEUR Clear 06/06/2020 1339   GLUCOSEU Negative 06/06/2020 1339   BILIRUBINUR Negative 06/06/2020 1339   PROTEINUR Negative 06/06/2020 1339   NITRITE Negative 06/06/2020 1339   LEUKOCYTESUR 1+ (A) 06/06/2020 1339    Radiological Exams on Admission: CT Head Wo Contrast  Result Date: 04/11/2021 CLINICAL DATA:  Mental status change. EXAM: CT HEAD WITHOUT CONTRAST TECHNIQUE: Contiguous axial images were obtained from the base of the skull through the vertex without intravenous contrast. COMPARISON:  05/27/2009 FINDINGS: Brain: No evidence of acute infarction, hemorrhage, hydrocephalus, extra-axial collection or mass lesion/mass effect. There is patchy low-attenuation within the subcortical and periventricular white matter compatible with chronic microvascular disease. Prominence of the sulci and ventricles compatible with brain atrophy. Vascular: No hyperdense vessel or unexpected calcification. Skull: Normal. Negative for fracture or focal lesion. Sinuses/Orbits: No acute finding. Other: None IMPRESSION: 1. No acute intracranial abnormalities. 2. Chronic small vessel ischemic change and brain atrophy. Electronically Signed   By: Kerby Moors M.D.   On: 04/11/2021 00:31   DG Chest Port 1 View  Result Date: 04/10/2021 CLINICAL DATA:  Dizziness EXAM: PORTABLE CHEST 1 VIEW COMPARISON:  None FINDINGS: Cardiac shadow is enlarged. Right lung is clear. Left lung demonstrates basilar opacity with small effusion. No bony abnormality is seen. IMPRESSION: Left basilar infiltrate with  associated small effusion. Electronically Signed   By: Inez Catalina M.D.   On: 04/10/2021 23:53      Assessment/Plan  Pre-syncope secondary to hypotension -Hypotension likely secondary to hypovolemia from her having decreased p.o. intake and also taking  daily torsemide -Has received 500 cc bolus in the ED.  We will continue with low-dose IV LR continuous fluids given history of heart failure and hyponatremia  Severe AKI on CKD stage 3b -Creatinine significantly elevated up to 4.19 with BUN of 88 from a prior creatinine of 1.62.  Based on previous nephrology documentation appears that she has been having some progression of her renal disease.   -Suspect this is now acutely worsened due to dehydration with lack of p.o. intake and torsemide use.  She also endorses self adjusting her regimen of metformin and taking increased doses -Obtain renal ultrasound -Obtain FeUrea lab -Hold Torsemide and benazepril   Hyponatremia -Na of 125  -due to hypovolemia and decrease PO intake - Follow BMP q4hr with IV continuous LR   Metabolic acidosis -Bicarb of 17 likely due to worsening renal insufficency  -obtain pH -give amp of bicarb if <7.3 given severe AKI. Will need to follow sodium closely if bicarb given to avoid rapid overcorrection. -continue to follow lab as renal function hopefully improves with fluid   Questionable pneumonia -pt notes upper respiratory for months which does not align with pneumonia -Has received IV Rocephin and Azithromycin in ED. Will obtain procalcitonin and hold on repeat antibiotics   Type 2 diabetes -Last Hb A1C in March at 7.3% -low dose SSI   Atrial fibrillation -CHA2DS2-VASc score of 5 - Hold Xarelto and switch to Eliquis for now due to worsening renal function   HFpEF -EF 60-65% on echo 11/2019. Monitor fluid status with hypotension and continuous IV fluid.  DVT prophylaxis:.Eliquis Code Status: Full Family Communication: Plan discussed with patient and  husband at bedside  disposition Plan: Home with at least 2 midnight stays  Consults called:  Admission status: inpatient   Level of care: Progressive Cardiac  Status is: Inpatient  Remains inpatient appropriate because:Inpatient level of care appropriate due to severity of illness  Dispo: The patient is from: Home              Anticipated d/c is to: Home              Patient currently is not medically stable to d/c.   Difficult to place patient No         Orene Desanctis DO [facility]   If 7PM-7AM, please contact night-coverage [email or web address]   04/11/2021, 1:48 AM

## 2021-04-11 NOTE — Plan of Care (Signed)
  Problem: Health Behavior/Discharge Planning: Goal: Ability to manage health-related needs will improve Outcome: Progressing Note: Patient profile completed. Skin intact. No complaints of pain. Spouse is at bedside. Patient is in agreement with plan of care.

## 2021-04-12 LAB — PROTEIN ELECTROPHORESIS, SERUM
A/G Ratio: 1 (ref 0.7–1.7)
Albumin ELP: 3.1 g/dL (ref 2.9–4.4)
Alpha-1-Globulin: 0.3 g/dL (ref 0.0–0.4)
Alpha-2-Globulin: 1.1 g/dL — ABNORMAL HIGH (ref 0.4–1.0)
Beta Globulin: 1.1 g/dL (ref 0.7–1.3)
Gamma Globulin: 0.5 g/dL (ref 0.4–1.8)
Globulin, Total: 3 g/dL (ref 2.2–3.9)
Total Protein ELP: 6.1 g/dL (ref 6.0–8.5)

## 2021-04-12 LAB — GLUCOSE, CAPILLARY
Glucose-Capillary: 142 mg/dL — ABNORMAL HIGH (ref 70–99)
Glucose-Capillary: 191 mg/dL — ABNORMAL HIGH (ref 70–99)
Glucose-Capillary: 193 mg/dL — ABNORMAL HIGH (ref 70–99)
Glucose-Capillary: 138 mg/dL — ABNORMAL HIGH (ref 70–99)
Glucose-Capillary: 268 mg/dL — ABNORMAL HIGH (ref 70–99)

## 2021-04-12 LAB — BASIC METABOLIC PANEL
Anion gap: 14 (ref 5–15)
BUN: 89 mg/dL — ABNORMAL HIGH (ref 8–23)
CO2: 23 mmol/L (ref 22–32)
Calcium: 8.4 mg/dL — ABNORMAL LOW (ref 8.9–10.3)
Chloride: 87 mmol/L — ABNORMAL LOW (ref 98–111)
Creatinine, Ser: 4.98 mg/dL — ABNORMAL HIGH (ref 0.44–1.00)
GFR, Estimated: 9 mL/min — ABNORMAL LOW (ref >60)
Glucose, Bld: 180 mg/dL — ABNORMAL HIGH (ref 70–99)
Potassium: 3.9 mmol/L (ref 3.5–5.1)
Sodium: 124 mmol/L — ABNORMAL LOW (ref 135–145)

## 2021-04-12 LAB — ANA W/REFLEX: Anti Nuclear Antibody (ANA): NEGATIVE

## 2021-04-12 LAB — UREA NITROGEN, URINE: Urea Nitrogen, Ur: 437 mg/dL

## 2021-04-12 LAB — KAPPA/LAMBDA LIGHT CHAINS
Kappa free light chain: 34.8 mg/L — ABNORMAL HIGH (ref 3.3–19.4)
Kappa, lambda light chain ratio: 2.07 — ABNORMAL HIGH (ref 0.26–1.65)
Lambda free light chains: 16.8 mg/L (ref 5.7–26.3)

## 2021-04-12 LAB — C4 COMPLEMENT: Complement C4, Body Fluid: 31 mg/dL (ref 12–38)

## 2021-04-12 LAB — ANCA TITERS
Atypical P-ANCA titer: <1:20 {titer}
C-ANCA: <1:20 {titer}
P-ANCA: <1:20 {titer}

## 2021-04-12 LAB — GLOMERULAR BASEMENT MEMBRANE ANTIBODIES: GBM Ab: 3 units (ref 0–20)

## 2021-04-12 LAB — C3 COMPLEMENT: C3 Complement: 135 mg/dL (ref 82–167)

## 2021-04-12 MED ORDER — MIDODRINE HCL 5 MG PO TABS
5.0000 mg | ORAL_TABLET | Freq: Three times a day (TID) | ORAL | Status: DC
  Administered 2021-04-12 – 2021-04-16 (×13): 5 mg via ORAL
  Filled 2021-04-12 (×13): qty 1

## 2021-04-12 MED ORDER — ADULT MULTIVITAMIN W/MINERALS CH
1.0000 | ORAL_TABLET | Freq: Every day | ORAL | Status: DC
  Administered 2021-04-13 – 2021-04-18 (×6): 1 via ORAL
  Filled 2021-04-12 (×6): qty 1

## 2021-04-12 MED ORDER — MIDODRINE HCL 5 MG PO TABS: 5.0000 mg | ORAL_TABLET | Freq: Three times a day (TID) | ORAL | Status: DC

## 2021-04-12 MED ORDER — ENSURE ENLIVE PO LIQD
237.0000 mL | Freq: Three times a day (TID) | ORAL | Status: DC
  Administered 2021-04-12 – 2021-04-18 (×16): 237 mL via ORAL

## 2021-04-12 MED ORDER — SODIUM CHLORIDE 0.9 % IV SOLN: INTRAVENOUS | Status: DC

## 2021-04-12 NOTE — Progress Notes (Signed)
Initial Nutrition Assessment  DOCUMENTATION CODES:   Obesity unspecified  INTERVENTION:   Ensure Enlive po TID, each supplement provides 350 kcal and 20 grams of protein  MVI po daily   Liberalize diet   Pt at high refeed risk; recommend monitor potassium, magnesium and phosphorus labs daily until stable  NUTRITION DIAGNOSIS:   Unintentional weight loss related to acute illness as evidenced by 12 percent weight loss in 3 months.  GOAL:   Patient will meet greater than or equal to 90% of their needs  MONITOR:   PO intake, Supplement acceptance, Labs, Weight trends, Skin, I & O's  REASON FOR ASSESSMENT:   Consult Assessment of nutrition requirement/status  ASSESSMENT:   75 y.o. female with medical history significant for HFpEF (EF 60-65% on echo 11/2019), atrial fibrillation on Xarelto, chronic venous insufficiency, PAD, type 2 diabetes and CKD stage IIIb who presents with concerns of presyncopal episode.  Met with pt in room today. Pt reports poor appetite and oral intake since April. Pt reports early satiety but believes the main reason for her poor oral intake is lethargy; pt reports that she just does not feel like eating. Pt reports that she drank one Boost supplement at home but is not drinking them regularly. Per chart, pt is down 27lbs(12%) in 3 months; this is significant. Pt reports eating bites of her breakfast this morning. Pt's lunch tray was sitting on her side table untouched. Pt does report drinking a glass of tea. RD discussed with pt the importance of adequate nutrition needed to preserve lean muscle. Pt reports that she believes she has already lost muscle as she reports weakness with standing and getting around her home. RD will add supplements and MVI to help pt meet her estimated needs. RD will also liberalize pt's diet as pt is not eating enough to exceed any nutrient limits. Pt is at high refeed risk.   Medications reviewed and include: insulin, NaCl  $Rem'@100ml'zzaF$ /hr, tramadol    Labs reviewed: Na 124(L), BUN 89(H), creat 4.98(H) cbgs- 142, 191, 193 x 24 hrs  NUTRITION - FOCUSED PHYSICAL EXAM:  Flowsheet Row Most Recent Value  Orbital Region No depletion  Upper Arm Region No depletion  Thoracic and Lumbar Region No depletion  Buccal Region No depletion  Temple Region No depletion  Clavicle Bone Region No depletion  Clavicle and Acromion Bone Region No depletion  Scapular Bone Region No depletion  Dorsal Hand No depletion  Patellar Region No depletion  Anterior Thigh Region No depletion  Posterior Calf Region No depletion  Edema (RD Assessment) Severe  Hair Reviewed  Eyes Reviewed  Mouth Reviewed  Skin Reviewed  Nails Reviewed      Diet Order:   Diet Order             Diet Carb Modified Fluid consistency: Thin; Room service appropriate? Yes  Diet effective now                  EDUCATION NEEDS:   Education needs have been addressed  Skin:  Skin Assessment: Reviewed RN Assessment (ecchymosis)  Last BM:  6/22  Height:   Ht Readings from Last 1 Encounters:  04/10/21 $RemoveB'5\' 2"'bdUaTPtd$  (1.575 m)    Weight:   Wt Readings from Last 1 Encounters:  04/12/21 94.1 kg    Ideal Body Weight:  50 kg  BMI:  Body mass index is 37.93 kg/m.  Estimated Nutritional Needs:   Kcal:  1800-2100kcal/day  Protein:  95-105g/day  Fluid:  1.5L/day  Reyden Smith MS, RD, LDN Please refer to AMION for RD and/or RD on-call/weekend/after hours pager  

## 2021-04-12 NOTE — Plan of Care (Signed)
  Problem: Health Behavior/Discharge Planning: Goal: Ability to manage health-related needs will improve Outcome: Progressing   Problem: Clinical Measurements: Goal: Ability to maintain clinical measurements within normal limits will improve Outcome: Progressing Goal: Will remain free from infection Outcome: Progressing Goal: Diagnostic test results will improve Outcome: Progressing Goal: Respiratory complications will improve Outcome: Progressing Goal: Cardiovascular complication will be avoided Outcome: Progressing   Problem: Education: Goal: Knowledge of condition and prescribed therapy will improve Outcome: Progressing   Problem: Cardiac: Goal: Will achieve and/or maintain adequate cardiac output Outcome: Progressing   Problem: Physical Regulation: Goal: Complications related to the disease process, condition or treatment will be avoided or minimized Outcome: Progressing

## 2021-04-12 NOTE — Progress Notes (Signed)
Mobility Specialist - Progress Note   04/12/21 1400  Mobility  Activity Refused mobility;Repositioned in chair  Level of Assistance Minimal assist, patient does 75% or more  Assistive Device None  Mobility performed by Mobility specialist  $Mobility charge 1 Mobility    Pt lying in bed upon arrival, requesting to reposition to [facility]. VC for technique and hand placement, minA. Pt politely declined further activity at this time, no reason specified. Pt reports "not having the energy" to engage in mobility session. Will attempt session at another date/time.    Kathee Delton Mobility Specialist 04/12/21, 2:29 PM

## 2021-04-12 NOTE — Progress Notes (Addendum)
[facility]  ROUNDING NOTE   Subjective:   Karen Farley was admitted to [facility] on 04/10/2021 for Dizziness [R42] Hyponatremia [E87.1] SIRS (systemic inflammatory response syndrome) (HCC) [R65.10] AKI (acute kidney injury) ([name]) [N17.9] Hypotension [I95.9] Acute renal failure, unspecified acute renal failure type ([name]) [N17.9] Syncope, unspecified syncope type [R55] Community acquired pneumonia of left lower lobe of lung [J18.9]  Patient endorses a syncopal episode. She has not been eating well. Patient continued to take her torsemide and benezapril. She states her glucose readings have been at goal.   Patient seen sitting at side of bed  Poor appetite Improved BLE edema    Objective:  Vital signs in last 24 hours:  Temp:  [97.6 F (36.4 C)-98.4 F (36.9 C)] 97.6 F (36.4 C) (06/23 1133) Pulse Rate:  [67-98] 90 (06/23 1133) Resp:  [17-28] 20 (06/23 1133) BP: (82-150)/(60-121) 94/75 (06/23 1133) SpO2:  [93 %-100 %] 96 % (06/23 1133) Weight:  [94.1 kg] 94.1 kg (06/23 0356)  Weight change: -3.447 kg Filed Weights   04/10/21 2246 04/12/21 0356  Weight: 97.5 kg 94.1 kg    Intake/Output: I/O last 3 completed shifts: In: 2385.8 [P.O.:660; I.V.:875.8; IV Piggyback:850] Out: -    Intake/Output this shift:  Total I/O In: 240 [P.O.:240] Out: -   Physical Exam: General: NAD, sitting in chair  Head: Normocephalic, atraumatic. Moist oral mucosal membranes  Eyes: Anicteric  Lungs:  Clear to auscultation  Heart: Regular rate and rhythm  Abdomen:  Soft, nontender, obese  Extremities:  trace peripheral edema.  Neurologic: Nonfocal, moving all four extremities  Skin: No lesions        Basic Metabolic Panel: Recent Labs  Lab 04/11/21 0928 04/11/21 1310 04/11/21 1908 04/11/21 2130 04/12/21 1041  NA 126* 126* 125* 125* 124*  K 5.1 5.1 4.5 4.1 3.9  CL 90* 92* 91* 90* 87*  CO2 21* 21* 22 22 23   GLUCOSE 161* 143* 148* 128* 180*  BUN 90* 89* 93*  98* 89*  CREATININE 4.46* 4.52* 4.57* 4.75* 4.98*  CALCIUM 9.0 9.1 8.9 8.9 8.4*     Liver Function Tests: No results for input(s): AST, ALT, ALKPHOS, BILITOT, PROT, ALBUMIN in the last 168 hours. No results for input(s): LIPASE, AMYLASE in the last 168 hours. No results for input(s): AMMONIA in the last 168 hours.  CBC: Recent Labs  Lab 04/10/21 2258  WBC 10.1  HGB 14.8  HCT 44.2  MCV 85.2  PLT 412*     Cardiac Enzymes: No results for input(s): CKTOTAL, CKMB, CKMBINDEX, TROPONINI in the last 168 hours.  BNP: Invalid input(s): POCBNP  CBG: Recent Labs  Lab 04/11/21 1754 04/11/21 2034 04/12/21 0210 04/12/21 0746 04/12/21 1135  GLUCAP 184* 162* 142* 191* 193*     Microbiology: Results for orders placed or performed during the hospital encounter of 04/10/21  Resp Panel by RT-PCR (Flu A&B, Covid) Nasopharyngeal Swab     Status: None   Collection Time: 04/11/21 12:12 AM   Specimen: Nasopharyngeal Swab; Nasopharyngeal(NP) swabs in vial transport medium  Result Value Ref Range Status   SARS Coronavirus 2 by RT PCR NEGATIVE NEGATIVE Final    Comment: (NOTE) SARS-CoV-2 target nucleic acids are NOT DETECTED.  The SARS-CoV-2 RNA is generally detectable in upper respiratory specimens during the acute phase of infection. The lowest concentration of SARS-CoV-2 viral copies this assay can detect is 138 copies/mL. A negative result does not preclude SARS-Cov-2 infection and should not be used as the sole basis for  treatment or other patient management decisions. A negative result may occur with  improper specimen collection/handling, submission of specimen other than nasopharyngeal swab, presence of viral mutation(s) within the areas targeted by this assay, and inadequate number of viral copies(<138 copies/mL). A negative result must be combined with clinical observations, patient history, and epidemiological information. The expected result is Negative.  Fact Sheet for  Patients:  EntrepreneurPulse.com.au  Fact Sheet for Healthcare Providers:  IncredibleEmployment.be  This test is no t yet approved or cleared by the [place] FDA and  has been authorized for detection and/or diagnosis of SARS-CoV-2 by FDA under an Emergency Use Authorization (EUA). This EUA will remain  in effect (meaning this test can be used) for the duration of the COVID-19 declaration under Section 564(b)(1) of the Act, 21 U.S.C.section 360bbb-3(b)(1), unless the authorization is terminated  or revoked sooner.       Influenza A by PCR NEGATIVE NEGATIVE Final   Influenza B by PCR NEGATIVE NEGATIVE Final    Comment: (NOTE) The Xpert Xpress SARS-CoV-2/FLU/RSV plus assay is intended as an aid in the diagnosis of influenza from Nasopharyngeal swab specimens and should not be used as a sole basis for treatment. Nasal washings and aspirates are unacceptable for Xpert Xpress SARS-CoV-2/FLU/RSV testing.  Fact Sheet for Patients: EntrepreneurPulse.com.au  Fact Sheet for Healthcare Providers: IncredibleEmployment.be  This test is not yet approved or cleared by the [place] FDA and has been authorized for detection and/or diagnosis of SARS-CoV-2 by FDA under an Emergency Use Authorization (EUA). This EUA will remain in effect (meaning this test can be used) for the duration of the COVID-19 declaration under Section 564(b)(1) of the Act, 21 U.S.C. section 360bbb-3(b)(1), unless the authorization is terminated or revoked.  Performed at [facility], [address] [place], [place] [address]   Culture, blood (routine x 2)     Status: None (Preliminary result)   Collection Time: 04/11/21 12:57 AM   Specimen: Left Antecubital; Blood  Result Value Ref Range Status   Specimen Description LEFT ANTECUBITAL  Final   Special Requests   Final    BOTTLES DRAWN AEROBIC AND ANAEROBIC Blood Culture  adequate volume   Culture   Final    NO GROWTH 1 DAY Performed at [facility], [address], [place], [place] [address]    Report Status PENDING  Incomplete  Culture, blood (routine x 2)     Status: None (Preliminary result)   Collection Time: 04/11/21 12:57 AM   Specimen: Left Antecubital; Blood  Result Value Ref Range Status   Specimen Description LEFT ANTECUBITAL  Final   Special Requests   Final    BOTTLES DRAWN AEROBIC AND ANAEROBIC Blood Culture adequate volume   Culture   Final    NO GROWTH 1 DAY Performed at [facility], [address] [place], [place] [address]    Report Status PENDING  Incomplete    Coagulation Studies: No results for input(s): LABPROT, INR in the last 72 hours.  Urinalysis: Recent Labs    04/11/21 1215  COLORURINE YELLOW*  LABSPEC 1.011  PHURINE 5.0  GLUCOSEU NEGATIVE  HGBUR NEGATIVE  BILIRUBINUR NEGATIVE  KETONESUR NEGATIVE  PROTEINUR NEGATIVE  NITRITE NEGATIVE  LEUKOCYTESUR MODERATE*       Imaging: CT Head Wo Contrast  Result Date: 04/11/2021 CLINICAL DATA:  Mental status change. EXAM: CT HEAD WITHOUT CONTRAST TECHNIQUE: Contiguous axial images were obtained from the base of the skull through the vertex without intravenous contrast. COMPARISON:  05/27/2009 FINDINGS: Brain: No evidence of acute infarction, hemorrhage, hydrocephalus, extra-axial collection or mass lesion/mass effect. There is patchy low-attenuation within the subcortical and periventricular white matter compatible with chronic microvascular disease. Prominence of the sulci and ventricles compatible with brain atrophy. Vascular: No hyperdense vessel or unexpected calcification. Skull: Normal. Negative for fracture or focal lesion. Sinuses/Orbits: No acute finding. Other: None IMPRESSION: 1. No acute intracranial abnormalities. 2. Chronic small vessel ischemic change and brain atrophy. Electronically Signed   By: Kerby Moors M.D.   On: 04/11/2021 00:31    US RENAL  Result Date: 04/11/2021 CLINICAL DATA:  Acute kidney injury. EXAM: RENAL / URINARY TRACT ULTRASOUND COMPLETE COMPARISON:  05/08/2020 FINDINGS: Right Kidney: Renal measurements: 10.0 x 5.0 x 4.7 cm = volume: 123.2. mL. Echogenicity within normal limits. No mass or hydronephrosis visualized. Left Kidney: Renal measurements: 10.7 x 6.4 x 6.1 cm. = volume: 219.7 mL. Echogenicity within normal limits. No mass or hydronephrosis visualized. Bladder: Incompletely distended.  No focal abnormality. Other: Left pleural effusion. IMPRESSION: 1. No acute abnormality. No hydronephrosis identified. 2. Left pleural effusion. Electronically Signed   By: Kerby Moors M.D.   On: 04/11/2021 03:56   DG Chest Port 1 View  Result Date: 04/10/2021 CLINICAL DATA:  Dizziness EXAM: PORTABLE CHEST 1 VIEW COMPARISON:  None FINDINGS: Cardiac shadow is enlarged. Right lung is clear. Left lung demonstrates basilar opacity with small effusion. No bony abnormality is seen. IMPRESSION: Left basilar infiltrate with associated small effusion. Electronically Signed   By: Inez Catalina M.D.   On: 04/10/2021 23:53     Medications:    sodium chloride 100 mL/hr at 04/12/21 1151    apixaban  5 mg Oral BID   insulin aspart  0-9 Units Subcutaneous TID PC,HS,0200   traMADol  Assessment/ Plan:  Karen Farley is a 75 y.o. white female with diabetes mellitus type II noninsulin dependent, hypertension, hyperlipidemia, atrial fibrillation, peripheral vascular disease who is admitted to [facility] on 04/10/2021 for Dizziness [R42] Hyponatremia [E87.1] SIRS (systemic inflammatory response syndrome) (HCC) [R65.10] AKI (acute kidney injury) ([place]) [N17.9] Hypotension [I95.9] Acute renal failure, unspecified acute renal failure type ([place]) [N17.9] Syncope, unspecified syncope type [R55] Community acquired pneumonia of left lower lobe of lung [J18.9]  Acute Kidney Injury on chronic kidney disease stage IIIB: with history of bland  urine. Baseline creatinine of 1.62, GFR of 33 on 01/15/2021. Followed by [facility], Dr. Candiss Norse. Hyponatremia: baseline 139 on 03/16/21. Sodium 124, will change IVF to NS Metabolic acidosis: gap acidosis 14.  Diabetes mellitus type II with renal manifestations. Hemoglobin A1c of 7.3% on 01/15/21.   History is consistent with prerenal azotemia. Ultrasound without obstruction. No IV contrast exposure.  - Will change fluids to NS - No indication for dialysis.  - Holding home torsemide, benazepril and metformin.    LOS: 1 Mylan Schwarz 6/23/202212:34 PM

## 2021-04-12 NOTE — Progress Notes (Signed)
NT Primitivo Gauze called this RN to report patient feeling clammy and diaphoretic. BP 87/67. CN RN Britt Bolognese came to bedside to assess patient. This RN in admission in Covid pending room. MD Amery made aware via secure chat about CBG 138  and BP 87/67. 5mg  Midodrine ordered and administered by RN Hassell Done. Ice packs applied to patient's neck. Husband at bedside with patient. Patient states she feels a touch better at this moment. Holding Novolog at this time. Fan ordered and placed in patient's room at bedside table. BP to be rechecked at 1730 (Midodrine administration at 1646).

## 2021-04-12 NOTE — Progress Notes (Signed)
PROGRESS NOTE    Karen Farley  FIE:[id number] DOB: August 23, 1946 DOA: 04/10/2021 PCP: Valerie Roys, DO    Brief Narrative:  Karen Farley is a 75 y.o. female with medical history significant for HFpEF (EF 60-65% on echo 11/2019), atrial fibrillation on Xarelto, chronic venous insufficiency, PAD, type 2 diabetes, CKD stage IIIb who presents with concerns of presyncopal episode.  Found with AKI on top of chronic kidney disease.  Hypotensive.    6/23 renal function worsened.  Consultants:  Nephrology  Procedures:   Antimicrobials:      Subjective: No acute shortness of breath.  She does not feel she is urinating much.  She does feel her color looks better.  Objective: Vitals:   04/11/21 2322 04/12/21 0349 04/12/21 0356 04/12/21 0744  BP: 104/67 (!) 135/121  92/72  Pulse: 78 87  98  Resp: (!) 21 20  18   Temp: 98.4 F (36.9 C) 97.6 F (36.4 C)  97.7 F (36.5 C)  TempSrc: Oral Oral    SpO2: 94% 100%  95%  Weight:   94.1 kg   Height:        Intake/Output Summary (Last 24 hours) at 04/12/2021 0839 Last data filed at 04/12/2021 0700 Gross per 24 hour  Intake 1535.79 ml  Output --  Net 1535.79 ml   Filed Weights   04/10/21 2246 04/12/21 0356  Weight: 97.5 kg 94.1 kg    Examination:  General exam: Appears calm and comfortable  Respiratory system: Clear to auscultation. Respiratory effort normal. Cardiovascular system: S1 & S2 heard, RRR. No JVD, murmurs, rubs, gallops or clicks.  Gastrointestinal system: Abdomen is nondistended, soft and nontende Normal bowel sounds heard. Central nervous system: Alert and oriented.  Grossly intact  Extremities: Mild edema Skin: Warm dry Psychiatry: Judgement and insight appear normal. Mood & affect appropriate.     Data Reviewed: I have personally reviewed following labs and imaging studies  CBC: Recent Labs  Lab 04/10/21 2258  WBC 10.1  HGB 14.8  HCT 44.2  MCV 85.2  PLT 166*   Basic Metabolic Panel: Recent  Labs  Lab 04/11/21 0600 04/11/21 0928 04/11/21 1310 04/11/21 1908 04/11/21 2130  NA 127* 126* 126* 125* 125*  K 5.5* 5.1 5.1 4.5 4.1  CL 95* 90* 92* 91* 90*  CO2 21* 21* 21* 22 22  GLUCOSE 153* 161* 143* 148* 128*  BUN 93* 90* 89* 93* 98*  CREATININE 4.06* 4.46* 4.52* 4.57* 4.75*  CALCIUM 9.0 9.0 9.1 8.9 8.9   GFR: Estimated Creatinine Clearance: 10.9 mL/min (A) (by C-G formula based on SCr of 4.75 mg/dL (H)). Liver Function Tests: No results for input(s): AST, ALT, ALKPHOS, BILITOT, PROT, ALBUMIN in the last 168 hours. No results for input(s): LIPASE, AMYLASE in the last 168 hours. No results for input(s): AMMONIA in the last 168 hours. Coagulation Profile: No results for input(s): INR, PROTIME in the last 168 hours. Cardiac Enzymes: No results for input(s): CKTOTAL, CKMB, CKMBINDEX, TROPONINI in the last 168 hours. BNP (last 3 results) No results for input(s): PROBNP in the last 8760 hours. HbA1C: No results for input(s): HGBA1C in the last 72 hours. CBG: Recent Labs  Lab 04/11/21 1258 04/11/21 1754 04/11/21 2034 04/12/21 0210 04/12/21 0746  GLUCAP 147* 184* 162* 142* 191*   Lipid Profile: No results for input(s): CHOL, HDL, LDLCALC, TRIG, CHOLHDL, LDLDIRECT in the last 72 hours. Thyroid Function Tests: Recent Labs    04/10/21 2258  TSH 3.091  FREET4 1.43*   Anemia  Panel: No results for input(s): VITAMINB12, FOLATE, FERRITIN, TIBC, IRON, RETICCTPCT in the last 72 hours. Sepsis Labs: Recent Labs  Lab 04/11/21 0057 04/11/21 0210 04/11/21 0211  PROCALCITON  --  <0.10  --   LATICACIDVEN 2.4*  --  1.8    Recent Results (from the past 240 hour(s))  Resp Panel by RT-PCR (Flu A&B, Covid) Nasopharyngeal Swab     Status: None   Collection Time: 04/11/21 12:12 AM   Specimen: Nasopharyngeal Swab; Nasopharyngeal(NP) swabs in vial transport medium  Result Value Ref Range Status   SARS Coronavirus 2 by RT PCR NEGATIVE NEGATIVE Final    Comment: (NOTE) SARS-CoV-2  target nucleic acids are NOT DETECTED.  The SARS-CoV-2 RNA is generally detectable in upper respiratory specimens during the acute phase of infection. The lowest concentration of SARS-CoV-2 viral copies this assay can detect is 138 copies/mL. A negative result does not preclude SARS-Cov-2 infection and should not be used as the sole basis for treatment or other patient management decisions. A negative result may occur with  improper specimen collection/handling, submission of specimen other than nasopharyngeal swab, presence of viral mutation(s) within the areas targeted by this assay, and inadequate number of viral copies(<138 copies/mL). A negative result must be combined with clinical observations, patient history, and epidemiological information. The expected result is Negative.  Fact Sheet for Patients:  EntrepreneurPulse.com.au  Fact Sheet for Healthcare Providers:  IncredibleEmployment.be  This test is no t yet approved or cleared by the [place] FDA and  has been authorized for detection and/or diagnosis of SARS-CoV-2 by FDA under an Emergency Use Authorization (EUA). This EUA will remain  in effect (meaning this test can be used) for the duration of the COVID-19 declaration under Section 564(b)(1) of the Act, 21 U.S.C.section 360bbb-3(b)(1), unless the authorization is terminated  or revoked sooner.       Influenza A by PCR NEGATIVE NEGATIVE Final   Influenza B by PCR NEGATIVE NEGATIVE Final    Comment: (NOTE) The Xpert Xpress SARS-CoV-2/FLU/RSV plus assay is intended as an aid in the diagnosis of influenza from Nasopharyngeal swab specimens and should not be used as a sole basis for treatment. Nasal washings and aspirates are unacceptable for Xpert Xpress SARS-CoV-2/FLU/RSV testing.  Fact Sheet for Patients: EntrepreneurPulse.com.au  Fact Sheet for Healthcare  Providers: IncredibleEmployment.be  This test is not yet approved or cleared by the [place] FDA and has been authorized for detection and/or diagnosis of SARS-CoV-2 by FDA under an Emergency Use Authorization (EUA). This EUA will remain in effect (meaning this test can be used) for the duration of the COVID-19 declaration under Section 564(b)(1) of the Act, 21 U.S.C. section 360bbb-3(b)(1), unless the authorization is terminated or revoked.  Performed at [facility], [address] [place], [place] [address]   Culture, blood (routine x 2)     Status: None (Preliminary result)   Collection Time: 04/11/21 12:57 AM   Specimen: Left Antecubital; Blood  Result Value Ref Range Status   Specimen Description LEFT ANTECUBITAL  Final   Special Requests   Final    BOTTLES DRAWN AEROBIC AND ANAEROBIC Blood Culture adequate volume   Culture   Final    NO GROWTH 1 DAY Performed at [facility], [address] [place], [place] [address]    Report Status PENDING  Incomplete  Culture, blood (routine x 2)     Status: None (Preliminary result)   Collection Time: 04/11/21 12:57 AM   Specimen: Left Antecubital; Blood  Result Value Ref Range Status   Specimen Description LEFT ANTECUBITAL  Final   Special Requests   Final    BOTTLES DRAWN AEROBIC AND ANAEROBIC Blood Culture adequate volume   Culture   Final    NO GROWTH 1 DAY Performed at [facility], [address] [place], [place] [address]    Report Status PENDING  Incomplete         Radiology Studies: CT Head Wo Contrast  Result Date: 04/11/2021 CLINICAL DATA:  Mental status change. EXAM: CT HEAD WITHOUT CONTRAST TECHNIQUE: Contiguous axial images were obtained from the base of the skull through the vertex without intravenous contrast. COMPARISON:  05/27/2009 FINDINGS: Brain: No evidence of acute infarction, hemorrhage, hydrocephalus, extra-axial collection or mass lesion/mass  effect. There is patchy low-attenuation within the subcortical and periventricular white matter compatible with chronic microvascular disease. Prominence of the sulci and ventricles compatible with brain atrophy. Vascular: No hyperdense vessel or unexpected calcification. Skull: Normal. Negative for fracture or focal lesion. Sinuses/Orbits: No acute finding. Other: None IMPRESSION: 1. No acute intracranial abnormalities. 2. Chronic small vessel ischemic change and brain atrophy. Electronically Signed   By: Kerby Moors M.D.   On: 04/11/2021 00:31   US RENAL  Result Date: 04/11/2021 CLINICAL DATA:  Acute kidney injury. EXAM: RENAL / URINARY TRACT ULTRASOUND COMPLETE COMPARISON:  05/08/2020 FINDINGS: Right Kidney: Renal measurements: 10.0 x 5.0 x 4.7 cm = volume: 123.2. mL. Echogenicity within normal limits. No mass or hydronephrosis visualized. Left Kidney: Renal measurements: 10.7 x 6.4 x 6.1 cm. = volume: 219.7 mL. Echogenicity within normal limits. No mass or hydronephrosis visualized. Bladder: Incompletely distended.  No focal abnormality. Other: Left pleural effusion. IMPRESSION: 1. No acute abnormality. No hydronephrosis identified. 2. Left pleural effusion. Electronically Signed   By: Kerby Moors M.D.   On: 04/11/2021 03:56   DG Chest Port 1 View  Result Date: 04/10/2021 CLINICAL DATA:  Dizziness EXAM: PORTABLE CHEST 1 VIEW COMPARISON:  None FINDINGS: Cardiac shadow is enlarged. Right lung is clear. Left lung demonstrates basilar opacity with small effusion. No bony abnormality is seen. IMPRESSION: Left basilar infiltrate with associated small effusion. Electronically Signed   By: Inez Catalina M.D.   On: 04/10/2021 23:53        Scheduled Meds:  apixaban  5 mg Oral BID   insulin aspart  0-9 Units Subcutaneous TID PC,HS,0200   Continuous Infusions:  sodium bicarbonate 150 mEq in D5W infusion 75 mL/hr at 04/12/21 0331    Assessment & Plan:   Principal Problem:   Hypotension Active  Problems:   Type 2 diabetes mellitus with renal complication (HCC)   Atrial fibrillation (HCC)   Acute kidney injury superimposed on CKD (HCC)   Hyponatremia   Metabolic acidosis   (HFpEF) heart failure with preserved ejection fraction (HCC)   Pre-syncope secondary to hypotension - Likely due to hypotension/hypovolemia  BP improving some with IV fluids      Severe AKI on CKD stage 3b -Creatinine significantly elevated up to 4.19 with BUN of 88 from a prior creatinine of 1.62.  Based on previous nephrology documentation appears that she has been having some progression of her renal disease.   -Suspect this is now acutely worsened due to dehydration with lack of p.o. intake and torsemide use.  She also endorses self adjusting her regimen of metformin and taking increased doses 6/23 creatinine worsening at 4.98. LR switched to normal sinus by nephrology Renal ultrasound no acute abnormality or hydronephrosis Nephrology following Continue  to hold torsemide and ACE inhibitor  Hyponatremia Sodium worsening, today 124 Started on normal saline now we will continue levels  Questionable pneumonia On chest x-ray.  Could be just from effusion and possible atelectasis.  Her procalcitonin is negative and she is afebrile.  No leukocytosis.  We will hold off on antibiotics   Metabolic acidosis -Bicarb of 17 likely due to worsening renal insufficency -obtain pH -give amp of bicarb if <7.3 given severe AKI. Will need to follow sodium closely if bicarb given to avoid rapid overcorrection. -continue to follow lab as renal function hopefully improves with fluid   Questionable pneumonia On chest x-ray.  Could be just from effusion and possible atelectasis.  Her procalcitonin is negative and she is afebrile.  No leukocytosis.  We will hold off on antibiotics   Type 2 diabetes -Last Hb A1C in March at 7.3% -low dose SSI   Atrial fibrillation -CHA2DS2-VASc score of 5 - Hold Xarelto and switched  to Eliquis for now due to worsening renal function    HFpEF -EF 60-65% on echo 11/2019. Monitor fluid status with hypotension and continuous IV fluid.     DVT prophylaxis: Eliquis Code Status: Full Family Communication: None at bedside Disposition Plan:  Status is: Inpatient  Remains inpatient appropriate because:Inpatient level of care appropriate due to severity of illness  Dispo: The patient is from: Home              Anticipated d/c is to: Home              Patient currently is not medically stable to d/c.   Difficult to place patient No            LOS: 1 day   Time spent: 35 minutes with more than 50% on [place], MD [facility] Pager [phone]  If 7PM-7AM, please contact night-coverage 04/12/2021, 8:39 AM

## 2021-04-13 LAB — GLUCOSE, CAPILLARY
Glucose-Capillary: 161 mg/dL — ABNORMAL HIGH (ref 70–99)
Glucose-Capillary: 94 mg/dL (ref 70–99)
Glucose-Capillary: 216 mg/dL — ABNORMAL HIGH (ref 70–99)
Glucose-Capillary: 144 mg/dL — ABNORMAL HIGH (ref 70–99)
Glucose-Capillary: 198 mg/dL — ABNORMAL HIGH (ref 70–99)
Glucose-Capillary: 154 mg/dL — ABNORMAL HIGH (ref 70–99)

## 2021-04-13 LAB — PROTEIN ELECTRO, RANDOM URINE
Albumin ELP, Urine: 100 %
Alpha-1-Globulin, U: 0 %
Alpha-2-Globulin, U: 0 %
Beta Globulin, U: 0 %
Gamma Globulin, U: 0 %
Total Protein, Urine: 20.8 mg/dL

## 2021-04-13 LAB — BASIC METABOLIC PANEL
Anion gap: 11 (ref 5–15)
BUN: 88 mg/dL — ABNORMAL HIGH (ref 8–23)
CO2: 24 mmol/L (ref 22–32)
Calcium: 8.4 mg/dL — ABNORMAL LOW (ref 8.9–10.3)
Chloride: 92 mmol/L — ABNORMAL LOW (ref 98–111)
Creatinine, Ser: 4.45 mg/dL — ABNORMAL HIGH (ref 0.44–1.00)
GFR, Estimated: 10 mL/min — ABNORMAL LOW (ref >60)
Glucose, Bld: 119 mg/dL — ABNORMAL HIGH (ref 70–99)
Potassium: 3.8 mmol/L (ref 3.5–5.1)
Sodium: 127 mmol/L — ABNORMAL LOW (ref 135–145)

## 2021-04-13 LAB — CBC
HCT: 34.4 % — ABNORMAL LOW (ref 36.0–46.0)
Hemoglobin: 11.7 g/dL — ABNORMAL LOW (ref 12.0–15.0)
MCH: 29 pg (ref 26.0–34.0)
MCHC: 34 g/dL (ref 30.0–36.0)
MCV: 85.4 fL (ref 80.0–100.0)
Platelets: 341 10*3/uL (ref 150–400)
RBC: 4.03 MIL/uL (ref 3.87–5.11)
RDW: 15.3 % (ref 11.5–15.5)
WBC: 8.1 10*3/uL (ref 4.0–10.5)
nRBC: 0 % (ref 0.0–0.2)

## 2021-04-13 NOTE — Progress Notes (Signed)
PT Cancellation Note  Patient Details Name: Karen Farley MRN: [id number] DOB: [birth year]   Cancelled Treatment:    Reason Eval/Treat Not Completed: Fatigue/lethargy limiting ability to participate 11:00-pt sleeping requesting PT check back 1142-pt sleeping, refused tx today; will check back tomorrow.  Judith Demps [name] 04/13/2021, 12:05 PM

## 2021-04-13 NOTE — Evaluation (Signed)
Occupational Therapy Evaluation Patient Details Name: ZALEAH TERNES MRN: [id number] DOB: [birth year] Today's Date: 04/13/2021    History of Present Illness 75 y.o. female with medical history significant for HFpEF (EF 60-65% on echo 11/2019), atrial fibrillation on Xarelto, chronic venous insufficiency, PAD, type 2 diabetes, CKD stage IIIb who presents with concerns of presyncopal episode.  Found with AKI on top of chronic kidney disease.  Hypotensive.   Clinical Impression    Patient presenting with decreased I in self care, balance, functional mobility/transfers, endurance, and safety awareness.Patient reports living at home with husband at mod I level with use of SPC PTA. Pt does endorse that she was only walking short distances at home prior to admission but she was able to perform self care tasks without assist. Patient currently functioning at min guard - min A with HHA. Pt needing encouragement for OOB this session and reports, " I finally feel better today". Patient will benefit from acute OT to increase overall independence in the areas of ADLs, functional mobility, and safety awareness in order to safely discharge home with husband.   Follow Up Recommendations  Home health OT;Supervision - Intermittent    Equipment Recommendations  None recommended by OT       Precautions / Restrictions Precautions Precautions: Fall      Mobility Bed Mobility Overal bed mobility: Needs Assistance Bed Mobility: Supine to Sit;Sit to Supine     Supine to sit: Min guard Sit to supine: Min guard   General bed mobility comments: min verbal cuing for technique and hand placement    Transfers Overall transfer level: Needs assistance Equipment used: 1 person hand held assist Transfers: Sit to/from [facility] Sit to Stand: Min guard Stand pivot transfers: Min guard            Balance Overall balance assessment: Needs assistance Sitting-balance support: Feet  supported Sitting balance-Leahy Scale: Good     Standing balance support: During functional activity;Bilateral upper extremity supported Standing balance-Leahy Scale: Fair Standing balance comment: needing UE support                           ADL either performed or assessed with clinical judgement   ADL Overall ADL's : Needs assistance/impaired Eating/Feeding: Independent   Grooming: Wash/dry hands;Wash/dry face;Standing;Min guard               Lower Body Dressing: Minimal assistance;Sit to/from stand   Toilet Transfer: Magazine features editor Details (indicate cue type and reason): simulated         Functional mobility during ADLs: Min guard;Minimal assistance       Vision Baseline Vision/History: No visual deficits Patient Visual Report: No change from baseline              Pertinent Vitals/Pain Pain Assessment: No/denies pain     Hand Dominance Right   Extremity/Trunk Assessment Upper Extremity Assessment Upper Extremity Assessment: Overall WFL for tasks assessed;Generalized weakness   Lower Extremity Assessment Lower Extremity Assessment: Overall WFL for tasks assessed;Generalized weakness       Communication Communication Communication: No difficulties   Cognition Arousal/Alertness: Awake/alert Behavior During Therapy: WFL for tasks assessed/performed Overall Cognitive Status: Within Functional Limits for tasks assessed  Home Living Family/patient expects to be discharged to:: Private residence Living Arrangements: Spouse/significant other Available Help at Discharge: Family;Available 24 hours/day Type of Home: Other(Comment) (condo) Home Access: Level entry     Home Layout: One level     Bathroom Shower/Tub: Walk-in shower         Home Equipment: Environmental consultant - 2 wheels;Cane - single point;Shower seat - built in          Prior Functioning/Environment  Level of Independence: Independent with assistive device(s)        Comments: Pt reports mod I with use of SPC but has only been ambulating short distances within the home. Pt reports performing ADLs independently and sharing IADL tasks with husband.        OT Problem List: Decreased strength;Decreased knowledge of use of DME or AE;Decreased knowledge of precautions;Decreased activity tolerance;Impaired balance (sitting and/or standing);Decreased safety awareness;Pain      OT Treatment/Interventions: Self-care/ADL training;Manual therapy;Therapeutic exercise;Modalities;Patient/family education;Energy conservation;DME and/or AE instruction;Cognitive remediation/compensation;Therapeutic activities;Balance training    OT Goals(Current goals can be found in the care plan section) Acute Rehab OT Goals Patient Stated Goal: to return home OT Goal Formulation: With patient Time For Goal Achievement: 04/27/21 Potential to Achieve Goals: Fair ADL Goals Pt Will Perform Grooming: with modified independence;standing Pt Will Perform Lower Body Dressing: with modified independence;sit to/from stand Pt Will Transfer to Toilet: with modified independence;ambulating Pt Will Perform Toileting - Clothing Manipulation and hygiene: with modified independence;sit to/from stand  OT Frequency: Min 2X/week   Barriers to D/C:    none known at this time          AM-PAC OT "6 Clicks" Daily Activity     Outcome Measure Help from another person eating meals?: None Help from another person taking care of personal grooming?: A Little Help from another person toileting, which includes using toliet, bedpan, or urinal?: A Little Help from another person bathing (including washing, rinsing, drying)?: A Little Help from another person to put on and taking off regular upper body clothing?: None Help from another person to put on and taking off regular lower body clothing?: A Little 6 Click Score: 20   End of  Session Nurse Communication: Mobility status  Activity Tolerance: Patient tolerated treatment well Patient left: in bed;with call bell/phone within reach;with bed alarm set  OT Visit Diagnosis: Unsteadiness on feet (R26.81);Muscle weakness (generalized) (M62.81)                Time: 1324-4010 OT Time Calculation (min): 25 min Charges:  OT General Charges $OT Visit: 1 Visit OT Evaluation $OT Eval Moderate Complexity: 1 Mod OT Treatments $Self Care/Home Management : 8-22 mins  Darleen Crocker, MS, OTR/L , CBIS ascom [phone]  04/13/21, 10:10 AM

## 2021-04-13 NOTE — Progress Notes (Signed)
[facility]  ROUNDING NOTE   Subjective:   [name] was admitted to [facility] on 04/10/2021 for Dizziness [R42] Hyponatremia [E87.1] SIRS (systemic inflammatory response syndrome) (HCC) [R65.10] AKI (acute kidney injury) ([place]) [N17.9] Hypotension [I95.9] Acute renal failure, unspecified acute renal failure type ([place]) [N17.9] Syncope, unspecified syncope type [R55] Community acquired pneumonia of left lower lobe of lung [J18.9]  Patient endorses a syncopal episode. She has not been eating well. Patient continued to take her torsemide and benezapril. She states her glucose readings have been at goal.   Patient seen resting in bed after working with physical therapy Alert and oriented States she feels better Increased facial redness, history of rosacea     Objective:  Vital signs in last 24 hours:  Temp:  [97.4 F (36.3 C)-98.1 F (36.7 C)] 97.4 F (36.3 C) (06/24 0823) Pulse Rate:  [76-104] 104 (06/24 0823) Resp:  [16-20] 16 (06/24 0823) BP: (87-118)/(63-84) 118/69 (06/24 0823) SpO2:  [93 %-99 %] 98 % (06/24 0823)  Weight change:  Filed Weights   04/10/21 2246 04/12/21 0356  Weight: 97.5 kg 94.1 kg    Intake/Output: I/O last 3 completed shifts: In: 2237.5 [P.O.:720; I.V.:1517.5] Out: 400 [Urine:400]   Intake/Output this shift:  Total I/O In: 240 [P.O.:240] Out: -   Physical Exam: General: NAD, resting in bed  Head: Normocephalic, atraumatic. Moist oral mucosal membranes  Eyes: Anicteric  Lungs:  Clear to auscultation  Heart: Regular rate and rhythm  Abdomen:  Soft, nontender, obese  Extremities:  trace peripheral edema.  Neurologic: Nonfocal, moving all four extremities  Skin: No lesions        Basic Metabolic Panel: Recent Labs  Lab 04/11/21 1310 04/11/21 1908 04/11/21 2130 04/12/21 1041 04/13/21 0504  NA 126* 125* 125* 124* 127*  K 5.1 4.5 4.1 3.9 3.8  CL 92* 91* 90* 87* 92*  CO2 21* 22 22 23 24   GLUCOSE 143* 148* 128*  180* 119*  BUN 89* 93* 98* 89* 88*  CREATININE 4.52* 4.57* 4.75* 4.98* 4.45*  CALCIUM 9.1 8.9 8.9 8.4* 8.4*     Liver Function Tests: No results for input(s): AST, ALT, ALKPHOS, BILITOT, PROT, ALBUMIN in the last 168 hours. No results for input(s): LIPASE, AMYLASE in the last 168 hours. No results for input(s): AMMONIA in the last 168 hours.  CBC: Recent Labs  Lab 04/10/21 2258 04/13/21 0504  WBC 10.1 8.1  HGB 14.8 11.7*  HCT 44.2 34.4*  MCV 85.2 85.4  PLT 412* 341     Cardiac Enzymes: No results for input(s): CKTOTAL, CKMB, CKMBINDEX, TROPONINI in the last 168 hours.  BNP: Invalid input(s): POCBNP  CBG: Recent Labs  Lab 04/12/21 1640 04/12/21 2157 04/13/21 0235 04/13/21 0806 04/13/21 1226  GLUCAP 138* 268* 161* 94 216*     Microbiology: Results for orders placed or performed during the hospital encounter of 04/10/21  Resp Panel by RT-PCR (Flu A&B, Covid) Nasopharyngeal Swab     Status: None   Collection Time: 04/11/21 12:12 AM   Specimen: Nasopharyngeal Swab; Nasopharyngeal(NP) swabs in vial transport medium  Result Value Ref Range Status   SARS Coronavirus 2 by RT PCR NEGATIVE NEGATIVE Final    Comment: (NOTE) SARS-CoV-2 target nucleic acids are NOT DETECTED.  The SARS-CoV-2 RNA is generally detectable in upper respiratory specimens during the acute phase of infection. The lowest concentration of SARS-CoV-2 viral copies this assay can detect is 138 copies/mL. A negative result does not preclude SARS-Cov-2 infection and should not  be used as the sole basis for treatment or other patient management decisions. A negative result may occur with  improper specimen collection/handling, submission of specimen other than nasopharyngeal swab, presence of viral mutation(s) within the areas targeted by this assay, and inadequate number of viral copies(<138 copies/mL). A negative result must be combined with clinical observations, patient history, and  epidemiological information. The expected result is Negative.  Fact Sheet for Patients:  EntrepreneurPulse.com.au  Fact Sheet for Healthcare Providers:  IncredibleEmployment.be  This test is no t yet approved or cleared by the [place] FDA and  has been authorized for detection and/or diagnosis of SARS-CoV-2 by FDA under an Emergency Use Authorization (EUA). This EUA will remain  in effect (meaning this test can be used) for the duration of the COVID-19 declaration under Section 564(b)(1) of the Act, 21 U.S.C.section 360bbb-3(b)(1), unless the authorization is terminated  or revoked sooner.       Influenza A by PCR NEGATIVE NEGATIVE Final   Influenza B by PCR NEGATIVE NEGATIVE Final    Comment: (NOTE) The Xpert Xpress SARS-CoV-2/FLU/RSV plus assay is intended as an aid in the diagnosis of influenza from Nasopharyngeal swab specimens and should not be used as a sole basis for treatment. Nasal washings and aspirates are unacceptable for Xpert Xpress SARS-CoV-2/FLU/RSV testing.  Fact Sheet for Patients: EntrepreneurPulse.com.au  Fact Sheet for Healthcare Providers: IncredibleEmployment.be  This test is not yet approved or cleared by the [place] FDA and has been authorized for detection and/or diagnosis of SARS-CoV-2 by FDA under an Emergency Use Authorization (EUA). This EUA will remain in effect (meaning this test can be used) for the duration of the COVID-19 declaration under Section 564(b)(1) of the Act, 21 U.S.C. section 360bbb-3(b)(1), unless the authorization is terminated or revoked.  Performed at [facility], [address] [place], [place] [address]   Culture, blood (routine x 2)     Status: None (Preliminary result)   Collection Time: 04/11/21 12:57 AM   Specimen: Left Antecubital; Blood  Result Value Ref Range Status   Specimen Description LEFT ANTECUBITAL  Final    Special Requests   Final    BOTTLES DRAWN AEROBIC AND ANAEROBIC Blood Culture adequate volume   Culture   Final    NO GROWTH 1 DAY Performed at [facility], [address] [place], [place] [address]    Report Status PENDING  Incomplete  Culture, blood (routine x 2)     Status: None (Preliminary result)   Collection Time: 04/11/21 12:57 AM   Specimen: Left Antecubital; Blood  Result Value Ref Range Status   Specimen Description LEFT ANTECUBITAL  Final   Special Requests   Final    BOTTLES DRAWN AEROBIC AND ANAEROBIC Blood Culture adequate volume   Culture   Final    NO GROWTH 1 DAY Performed at [facility], [address], [place], [place] [address]    Report Status PENDING  Incomplete    Coagulation Studies: No results for input(s): LABPROT, INR in the last 72 hours.  Urinalysis: Recent Labs    04/11/21 1215  COLORURINE YELLOW*  LABSPEC 1.011  PHURINE 5.0  GLUCOSEU NEGATIVE  HGBUR NEGATIVE  BILIRUBINUR NEGATIVE  KETONESUR NEGATIVE  PROTEINUR NEGATIVE  NITRITE NEGATIVE  LEUKOCYTESUR MODERATE*       Imaging: No results found.   Medications:    sodium chloride 100 mL/hr at 04/13/21 0906    apixaban  5 mg Oral BID   feeding supplement  237 mL  Oral TID BM   insulin aspart  0-9 Units Subcutaneous TID PC,HS,0200   midodrine  5 mg Oral TID WC   multivitamin with minerals  1 tablet Oral Daily   traMADol  Assessment/ Plan:  [name] is a 75 y.o. white female with diabetes mellitus type II noninsulin dependent, hypertension, hyperlipidemia, atrial fibrillation, peripheral vascular disease who is admitted to [facility] on 04/10/2021 for Dizziness [R42] Hyponatremia [E87.1] SIRS (systemic inflammatory response syndrome) (HCC) [R65.10] AKI (acute kidney injury) ([place]) [N17.9] Hypotension [I95.9] Acute renal failure, unspecified acute renal failure type ([place]) [N17.9] Syncope, unspecified syncope type [R55] Community acquired pneumonia of  left lower lobe of lung [J18.9]  Acute Kidney Injury on chronic kidney disease stage IIIB: with history of bland urine. Baseline creatinine of 1.62, GFR of 33 on 01/15/2021. Followed by [facility], Dr. Candiss Norse. Creatinine decreased to 4.45. UOP 475ml Hyponatremia: baseline 139 on 03/16/21. Sodium improved to 127, continue NS @100ml /hr Metabolic acidosis: gap acidosis 11.  Diabetes mellitus type II with renal manifestations. Hemoglobin A1c of 7.3% on 01/15/21.   History is consistent with prerenal azotemia. Ultrasound without obstruction. No IV contrast exposure.  - No indication for dialysis.  - Holding home torsemide, benazepril and metformin.    LOS: 2 Ioan Landini 6/24/20221:28 PM

## 2021-04-13 NOTE — Progress Notes (Signed)
PROGRESS NOTE    Karen Farley  HEN:[id number] DOB: [birth year] DOA: 04/10/2021 PCP: Valerie Roys, DO    Brief Narrative:  Karen Farley is a 75 y.o. female with medical history significant for HFpEF (EF 60-65% on echo 11/2019), atrial fibrillation on Xarelto, chronic venous insufficiency, PAD, type 2 diabetes, CKD stage IIIb who presents with concerns of presyncopal episode.  Found with AKI on top of chronic kidney disease.  Hypotensive.    6/23 renal function worsened. 6/24-feels better, feels has more color. No acute sob  or cp.  Consultants:  Nephrology  Procedures:   Antimicrobials:      Subjective: No dizziness or lightheadedness  Objective: Vitals:   04/12/21 1640 04/12/21 1729 04/12/21 1911 04/13/21 0620  BP: (!) 87/67 92/78 108/84 90/63  Pulse: 76 95 85 85  Resp: 20  17 17   Temp: 97.6 F (36.4 C)  97.6 F (36.4 C) 98.1 F (36.7 C)  TempSrc: Oral  Oral Oral  SpO2: 95% 93% 98% 99%  Weight:      Height:        Intake/Output Summary (Last 24 hours) at 04/13/2021 0809 Last data filed at 04/13/2021 3614 Gross per 24 hour  Intake 881.71 ml  Output 400 ml  Net 481.71 ml   Filed Weights   04/10/21 2246 04/12/21 0356  Weight: 97.5 kg 94.1 kg    Examination: Appears and looks better more color today on her face Clear to auscultation no wheeze rales rhonchi's Regular S1-S2 no gallops Soft benign positive bowel sounds Trace pedal edema bilaterally Alert oriented x4 grossly intact Mood and affect appropriate in current setting     Data Reviewed: I have personally reviewed following labs and imaging studies  CBC: Recent Labs  Lab 04/10/21 2258 04/13/21 0504  WBC 10.1 8.1  HGB 14.8 11.7*  HCT 44.2 34.4*  MCV 85.2 85.4  PLT 412* 431   Basic Metabolic Panel: Recent Labs  Lab 04/11/21 1310 04/11/21 1908 04/11/21 2130 04/12/21 1041 04/13/21 0504  NA 126* 125* 125* 124* 127*  K 5.1 4.5 4.1 3.9 3.8  CL 92* 91* 90* 87* 92*  CO2 21*  22 22 23 24   GLUCOSE 143* 148* 128* 180* 119*  BUN 89* 93* 98* 89* 88*  CREATININE 4.52* 4.57* 4.75* 4.98* 4.45*  CALCIUM 9.1 8.9 8.9 8.4* 8.4*   GFR: Estimated Creatinine Clearance: 11.7 mL/min (A) (by C-G formula based on SCr of 4.45 mg/dL (H)). Liver Function Tests: No results for input(s): AST, ALT, ALKPHOS, BILITOT, PROT, ALBUMIN in the last 168 hours. No results for input(s): LIPASE, AMYLASE in the last 168 hours. No results for input(s): AMMONIA in the last 168 hours. Coagulation Profile: No results for input(s): INR, PROTIME in the last 168 hours. Cardiac Enzymes: No results for input(s): CKTOTAL, CKMB, CKMBINDEX, TROPONINI in the last 168 hours. BNP (last 3 results) No results for input(s): PROBNP in the last 8760 hours. HbA1C: No results for input(s): HGBA1C in the last 72 hours. CBG: Recent Labs  Lab 04/12/21 1135 04/12/21 1640 04/12/21 2157 04/13/21 0235 04/13/21 0806  GLUCAP 193* 138* 268* 161* 94   Lipid Profile: No results for input(s): CHOL, HDL, LDLCALC, TRIG, CHOLHDL, LDLDIRECT in the last 72 hours. Thyroid Function Tests: Recent Labs    04/10/21 2258  TSH 3.091  FREET4 1.43*   Anemia Panel: No results for input(s): VITAMINB12, FOLATE, FERRITIN, TIBC, IRON, RETICCTPCT in the last 72 hours. Sepsis Labs: Recent Labs  Lab 04/11/21 0057 04/11/21 0210 04/11/21  0211  PROCALCITON  --  <0.10  --   LATICACIDVEN 2.4*  --  1.8    Recent Results (from the past 240 hour(s))  Resp Panel by RT-PCR (Flu A&B, Covid) Nasopharyngeal Swab     Status: None   Collection Time: 04/11/21 12:12 AM   Specimen: Nasopharyngeal Swab; Nasopharyngeal(NP) swabs in vial transport medium  Result Value Ref Range Status   SARS Coronavirus 2 by RT PCR NEGATIVE NEGATIVE Final    Comment: (NOTE) SARS-CoV-2 target nucleic acids are NOT DETECTED.  The SARS-CoV-2 RNA is generally detectable in upper respiratory specimens during the acute phase of infection. The  lowest concentration of SARS-CoV-2 viral copies this assay can detect is 138 copies/mL. A negative result does not preclude SARS-Cov-2 infection and should not be used as the sole basis for treatment or other patient management decisions. A negative result may occur with  improper specimen collection/handling, submission of specimen other than nasopharyngeal swab, presence of viral mutation(s) within the areas targeted by this assay, and inadequate number of viral copies(<138 copies/mL). A negative result must be combined with clinical observations, patient history, and epidemiological information. The expected result is Negative.  Fact Sheet for Patients:  EntrepreneurPulse.com.au  Fact Sheet for Healthcare Providers:  IncredibleEmployment.be  This test is no t yet approved or cleared by the [place] FDA and  has been authorized for detection and/or diagnosis of SARS-CoV-2 by FDA under an Emergency Use Authorization (EUA). This EUA will remain  in effect (meaning this test can be used) for the duration of the COVID-19 declaration under Section 564(b)(1) of the Act, 21 U.S.C.section 360bbb-3(b)(1), unless the authorization is terminated  or revoked sooner.       Influenza A by PCR NEGATIVE NEGATIVE Final   Influenza B by PCR NEGATIVE NEGATIVE Final    Comment: (NOTE) The Xpert Xpress SARS-CoV-2/FLU/RSV plus assay is intended as an aid in the diagnosis of influenza from Nasopharyngeal swab specimens and should not be used as a sole basis for treatment. Nasal washings and aspirates are unacceptable for Xpert Xpress SARS-CoV-2/FLU/RSV testing.  Fact Sheet for Patients: EntrepreneurPulse.com.au  Fact Sheet for Healthcare Providers: IncredibleEmployment.be  This test is not yet approved or cleared by the [place] FDA and has been authorized for detection and/or diagnosis of SARS-CoV-2 by FDA under  an Emergency Use Authorization (EUA). This EUA will remain in effect (meaning this test can be used) for the duration of the COVID-19 declaration under Section 564(b)(1) of the Act, 21 U.S.C. section 360bbb-3(b)(1), unless the authorization is terminated or revoked.  Performed at [facility], [address] [place], [place] [address]   Culture, blood (routine x 2)     Status: None (Preliminary result)   Collection Time: 04/11/21 12:57 AM   Specimen: Left Antecubital; Blood  Result Value Ref Range Status   Specimen Description LEFT ANTECUBITAL  Final   Special Requests   Final    BOTTLES DRAWN AEROBIC AND ANAEROBIC Blood Culture adequate volume   Culture   Final    NO GROWTH 1 DAY Performed at [facility], [address] [place], [place] [address]    Report Status PENDING  Incomplete  Culture, blood (routine x 2)     Status: None (Preliminary result)   Collection Time: 04/11/21 12:57 AM   Specimen: Left Antecubital; Blood  Result Value Ref Range Status   Specimen Description LEFT ANTECUBITAL  Final   Special Requests   Final    BOTTLES DRAWN AEROBIC AND  ANAEROBIC Blood Culture adequate volume   Culture   Final    NO GROWTH 1 DAY Performed at [facility], [address], [place], [place] [address]    Report Status PENDING  Incomplete         Radiology Studies: No results found.      Scheduled Meds:  apixaban  5 mg Oral BID   feeding supplement  237 mL Oral TID BM   insulin aspart  0-9 Units Subcutaneous TID PC,HS,0200   midodrine  5 mg Oral TID WC   multivitamin with minerals  1 tablet Oral Daily   Continuous Infusions:  sodium chloride 100 mL/hr at 04/12/21 2329    Assessment & Plan:   Principal Problem:   Hypotension Active Problems:   Type 2 diabetes mellitus with renal complication (HCC)   Atrial fibrillation (HCC)   Acute kidney injury superimposed on CKD (HCC)   Hyponatremia   Metabolic acidosis   (HFpEF) heart  failure with preserved ejection fraction (HCC)   Pre-syncope secondary to hypotension - Likely due to hypotension/hypovolemia  6/24 BP improving with IV fluids.  We will continue hydration      Severe AKI on CKD stage 3b -Creatinine significantly elevated up to 4.19 with BUN of 88 from a prior creatinine of 1.62.  Based on previous nephrology documentation appears that she has been having some progression of her renal disease.   -Suspect this is now acutely worsened due to dehydration with lack of p.o. intake and torsemide use.  She also endorses self adjusting her regimen of metformin and taking increased doses 6/24 creatinine improving down to 4.45 Continue normal saline Renal ultrasound no acute abnormality or hydronephrosis Continue to hold torsemide and ACE inhibitor Nephrology following     Hyponatremia Secondary to hypovolemia  Has improved to 127 with IV fluids.  We will continue    Questionable pneumonia On chest x-ray.  Could be just from effusion and possible atelectasis.  Her procalcitonin is negative  6/24 no leukocytosis.  Remains afebrile.  We will hold off on antibiotics Incentive spirometer    Metabolic acidosis -Bicarb of 17 likely due to worsening renal insufficency Improved with IV fluids      Type 2 diabetes -Last Hb A1C in March at 7.3% 6/24-BG stable  Continue R-ISS     Atrial fibrillation -CHA2DS2-VASc score of 5 - Hold Xarelto and switched to Eliquis for now due to worsening renal function    HFpEF -EF 60-65% on echo 11/2019.  6/24 no acute exacerbation.  Monitor closely as she is receiving IV fluids       DVT prophylaxis: Eliquis Code Status: Full Family Communication: None at bedside Disposition Plan:  Status is: Inpatient  Remains inpatient appropriate because:Inpatient level of care appropriate due to severity of illness  Dispo: The patient is from: Home              Anticipated d/c is to: Home              Patient  currently is not medically stable to d/c.   Difficult to place patient No            LOS: 2 days   Time spent: 45 minutes with more than 50% on [place], MD [facility] Pager [phone]  If 7PM-7AM, please contact night-coverage 04/13/2021, 8:09 AM

## 2021-04-13 NOTE — Care Management Important Message (Signed)
Important Message  Patient Details  Name: Karen Farley MRN: [id number] Date of Birth: [birth year]   Medicare Important Message Given:  Yes     Dannette Barbara 04/13/2021, 2:25 PM

## 2021-04-14 LAB — BASIC METABOLIC PANEL
Anion gap: 12 (ref 5–15)
BUN: 79 mg/dL — ABNORMAL HIGH (ref 8–23)
CO2: 23 mmol/L (ref 22–32)
Calcium: 8.7 mg/dL — ABNORMAL LOW (ref 8.9–10.3)
Chloride: 95 mmol/L — ABNORMAL LOW (ref 98–111)
Creatinine, Ser: 3.74 mg/dL — ABNORMAL HIGH (ref 0.44–1.00)
GFR, Estimated: 12 mL/min — ABNORMAL LOW (ref >60)
Glucose, Bld: 148 mg/dL — ABNORMAL HIGH (ref 70–99)
Potassium: 4.3 mmol/L (ref 3.5–5.1)
Sodium: 130 mmol/L — ABNORMAL LOW (ref 135–145)

## 2021-04-14 LAB — GLUCOSE, CAPILLARY
Glucose-Capillary: 147 mg/dL — ABNORMAL HIGH (ref 70–99)
Glucose-Capillary: 146 mg/dL — ABNORMAL HIGH (ref 70–99)
Glucose-Capillary: 204 mg/dL — ABNORMAL HIGH (ref 70–99)
Glucose-Capillary: 144 mg/dL — ABNORMAL HIGH (ref 70–99)
Glucose-Capillary: 142 mg/dL — ABNORMAL HIGH (ref 70–99)

## 2021-04-14 MED ORDER — LIDOCAINE 5 % EX PTCH
1.0000 | MEDICATED_PATCH | CUTANEOUS | Status: DC
  Administered 2021-04-14 – 2021-04-17 (×4): 1 via TRANSDERMAL
  Filled 2021-04-14 (×5): qty 1

## 2021-04-14 MED ORDER — METOPROLOL SUCCINATE ER 25 MG PO TB24
12.5000 mg | ORAL_TABLET | Freq: Every day | ORAL | Status: DC
  Administered 2021-04-14 – 2021-04-15 (×2): 12.5 mg via ORAL
  Filled 2021-04-14 (×2): qty 1

## 2021-04-14 NOTE — Evaluation (Signed)
Physical Therapy Evaluation Patient Details Name: Karen Farley MRN: [id number] DOB: [birth year] Today's Date: 04/14/2021   History of Present Illness  75 y.o. female with medical history significant for HFpEF (EF 60-65% on echo 11/2019), atrial fibrillation on Xarelto, chronic venous insufficiency, PAD, type 2 diabetes, CKD stage IIIb who presents with concerns of presyncopal episode.  Found with AKI on top of chronic kidney disease.  Hypotensive.  Clinical Impression  Patient sitting in recliner beginning of session; alert and oriented, follows commands and agreeable to some participation with session.  Denies pain, but endorses feeling "just exhausted" this AM (did not sleep well last night).  Bilat UE/LE strength and ROM grossly symmetrical and WFL; no focal weakness appreciated.  Able to complete sit/stand and static standing balance with RW, close sup; does prefer UE support for optimal stability.  Orthostatics assessed with movement transition: sitting BP 121/87, HR 98; standing BP 122/80, HR 129.  Denies subjective symptoms of orthostasis or pre-syncope; but does insist on cessation of activity due to generalized fatigue.  RN informed/aware of HR response to very limited activity. Will continue to assess/progress mobility in subsequent session as medically appropriate. Would benefit from skilled PT to address above deficits and promote optimal return to PLOF.; Recommend transition to HHPT upon discharge from acute hospitalization.     Follow Up Recommendations Home health PT    Equipment Recommendations  Rolling walker with 5" wheels    Recommendations for Other Services       Precautions / Restrictions Precautions Precautions: Fall Restrictions Weight Bearing Restrictions: No      Mobility  Bed Mobility               General bed mobility comments: seated in recliner beginning/end of treatment session    Transfers Overall transfer level: Needs assistance Equipment  used: Rolling walker (2 wheeled) Transfers: Sit to/from Stand Sit to Stand: Supervision         General transfer comment: tends to pull on RW with movement transition  Ambulation/Gait             General Gait Details: refused any gait efforts, reporting "exhaustion" today  Stairs            Wheelchair Mobility    Modified Rankin (Stroke Patients Only)       Balance Overall balance assessment: Needs assistance Sitting-balance support: No upper extremity supported;Feet supported Sitting balance-Leahy Scale: Good     Standing balance support: Bilateral upper extremity supported Standing balance-Leahy Scale: Fair                               Pertinent Vitals/Pain Pain Assessment: No/denies pain    Home Living Family/patient expects to be discharged to:: Private residence Living Arrangements: Spouse/significant other Available Help at Discharge: Family;Available 24 hours/day Type of Home: Other(Comment) Home Access: Level entry     Home Layout: One level Home Equipment: Walker - 2 wheels;Cane - single point;Shower seat - built in      Prior Function Level of Independence: Independent with assistive device(s)         Comments: Mod indep without assist device for limited household distances; [facility] for longer distances when does go outside of the home.  No home O2. No fall history. Husband assists with household chores, transportation and IADLs as needed.     Hand Dominance        Extremity/Trunk Assessment   Upper Extremity Assessment Upper  Extremity Assessment: Overall WFL for tasks assessed    Lower Extremity Assessment Lower Extremity Assessment: Overall WFL for tasks assessed;Generalized weakness (grossly 4-/5 throughout)       Communication   Communication: No difficulties  Cognition Arousal/Alertness: Awake/alert Behavior During Therapy: WFL for tasks assessed/performed Overall Cognitive Status: Within Functional Limits  for tasks assessed                                        General Comments      Exercises     Assessment/Plan    PT Assessment Patient needs continued PT services  PT Problem List Decreased strength;Decreased activity tolerance;Decreased balance;Decreased mobility;Decreased knowledge of use of DME;Decreased cognition;Decreased safety awareness;Decreased knowledge of precautions;Cardiopulmonary status limiting activity       PT Treatment Interventions Gait training;Functional mobility training;Therapeutic activities;DME instruction;Balance training;Therapeutic exercise;Patient/family education    PT Goals (Current goals can be found in the Care Plan section)  Acute Rehab PT Goals Patient Stated Goal: to return home PT Goal Formulation: With patient Time For Goal Achievement: 04/28/21 Potential to Achieve Goals: Good    Frequency Min 2X/week   Barriers to discharge        Co-evaluation               AM-PAC PT "6 Clicks" Mobility  Outcome Measure Help needed turning from your back to your side while in a flat bed without using bedrails?: None Help needed moving from lying on your back to sitting on the side of a flat bed without using bedrails?: None Help needed moving to and from a bed to a chair (including a wheelchair)?: None Help needed standing up from a chair using your arms (e.g., wheelchair or bedside chair)?: A Little Help needed to walk in hospital room?: A Little Help needed climbing 3-5 steps with a railing? : A Little 6 Click Score: 21    End of Session   Activity Tolerance: Patient limited by fatigue Patient left: in chair;with call bell/phone within reach;with chair alarm set;with family/visitor present Nurse Communication: Mobility status PT Visit Diagnosis: Muscle weakness (generalized) (M62.81);Difficulty in walking, not elsewhere classified (R26.2)    Time: 0600-4599 PT Time Calculation (min) (ACUTE ONLY): 17 min   Charges:    PT Evaluation $PT Eval Moderate Complexity: 1 Mod          Sabrie Moritz H. Owens Shark, PT, DPT, NCS 04/14/21, 10:54 AM [phone]

## 2021-04-14 NOTE — Progress Notes (Signed)
[facility]  PROGRESS NOTE   Subjective:   Patient is out of bed to chair. Had lunch today. Patient's husband is at bedside.  Objective:  Vital signs in last 24 hours:  Temp:  [95.7 F (35.4 C)-98.4 F (36.9 C)] 97.6 F (36.4 C) (06/25 1134) Pulse Rate:  [57-122] 109 (06/25 1300) Resp:  [14-22] 20 (06/25 1300) BP: (100-136)/(80-87) 120/82 (06/25 1134) SpO2:  [63 %-100 %] 100 % (06/25 1159) Weight:  [102.1 kg] 102.1 kg (06/25 0200)  Weight change:  Filed Weights   04/10/21 2246 04/12/21 0356 04/14/21 0200  Weight: 97.5 kg 94.1 kg 102.1 kg    Intake/Output: I/O last 3 completed shifts: In: 1279.8 [P.O.:480; I.V.:799.8] Out: 800 [Urine:800]   Intake/Output this shift:  Total I/O In: 480 [P.O.:480] Out: -   Physical Exam: General:  No acute distress  Head:  Normocephalic, atraumatic. Moist oral mucosal membranes  Eyes:  Anicteric  Neck:  Supple  Lungs:   Clear to auscultation, normal effort  Heart:  S1S2 no rubs  Abdomen:   Soft, nontender, bowel sounds present  Extremities: 1+ peripheral edema.  Neurologic:  Awake, alert, following commands  Skin:  No lesions  Access:     Basic Metabolic Panel: Recent Labs  Lab 04/11/21 1908 04/11/21 2130 04/12/21 1041 04/13/21 0504 04/14/21 0657  NA 125* 125* 124* 127* 130*  K 4.5 4.1 3.9 3.8 4.3  CL 91* 90* 87* 92* 95*  CO2 22 22 23 24 23   GLUCOSE 148* 128* 180* 119* 148*  BUN 93* 98* 89* 88* 79*  CREATININE 4.57* 4.75* 4.98* 4.45* 3.74*  CALCIUM 8.9 8.9 8.4* 8.4* 8.7*    CBC: Recent Labs  Lab 04/10/21 2258 04/13/21 0504  WBC 10.1 8.1  HGB 14.8 11.7*  HCT 44.2 34.4*  MCV 85.2 85.4  PLT 412* 341     Urinalysis: No results for input(s): COLORURINE, LABSPEC, PHURINE, GLUCOSEU, HGBUR, BILIRUBINUR, KETONESUR, PROTEINUR, UROBILINOGEN, NITRITE, LEUKOCYTESUR in the last 72 hours.  Invalid input(s): APPERANCEUR    Imaging: No results found.   Medications:    sodium chloride 100 mL/hr at  04/14/21 1430    apixaban  5 mg Oral BID   feeding supplement  237 mL Oral TID BM   insulin aspart  0-9 Units Subcutaneous TID PC,HS,0200   lidocaine  1 patch Transdermal Q24H   metoprolol succinate  12.5 mg Oral Daily   midodrine  5 mg Oral TID WC   multivitamin with minerals  1 tablet Oral Daily    Assessment/ Plan:     Principal Problem:   Hypotension Active Problems:   Type 2 diabetes mellitus with renal complication (HCC)   Atrial fibrillation (HCC)   Acute kidney injury superimposed on CKD (HCC)   Hyponatremia   Metabolic acidosis   (HFpEF) heart failure with preserved ejection fraction [employer] [facility] [employer])  75 year old female with history of diabetes, peripheral vascular disease, hypertension, hyperlipidemia, atrial fibrillation and congestive heart failure now admitted with history of fluid overload and also pneumonia.  Patient also developed acute kidney injury on top of chronic kidney disease. She has baseline creatinine of about 1.7 with a GFR of about 35 cc/min.  Creatinine has now improved from 4.98-3.75 today.  Patient is advised on the importance of strict blood sugar control.  Dietary advice given to the patient bedside. We will continue to follow closely during hospitalization. Spoke to the patient and her spouse at bedside and answered all their questions.   LOS: [place], MD [facility] 6/25/20222:54 PM

## 2021-04-14 NOTE — Progress Notes (Addendum)
PROGRESS NOTE    Karen Farley  PFX:[id number] DOB: 01-21-1946 DOA: 04/10/2021 PCP: Valerie Roys, DO    Brief Narrative:  Karen Farley is a 75 y.o. female with medical history significant for HFpEF (EF 60-65% on echo 11/2019), atrial fibrillation on Xarelto, chronic venous insufficiency, PAD, type 2 diabetes, CKD stage IIIb who presents with concerns of presyncopal episode.  Found with AKI on top of chronic kidney disease.  Hypotensive.    6/23 renal function worsened. 6/24-feels better, feels has more color. No acute sob  or cp. 6/25-feels tired today. Creatinine at 3.74. on tele afib with variable HR 109-122,  Consultants:  Nephrology  Procedures:   Antimicrobials:      Subjective: No acute sob, cp, dizziness  Objective: Vitals:   04/14/21 0845 04/14/21 1134 04/14/21 1159 04/14/21 1300  BP:  120/82    Pulse: 98 93 (!) 122 (!) 109  Resp: 19 (!) 22  20  Temp:  97.6 F (36.4 C)    TempSrc:      SpO2:  (!) 63% 100%   Weight:      Height:        Intake/Output Summary (Last 24 hours) at 04/14/2021 1518 Last data filed at 04/14/2021 1227 Gross per 24 hour  Intake 1279.83 ml  Output 400 ml  Net 879.83 ml   Filed Weights   04/10/21 2246 04/12/21 0356 04/14/21 0200  Weight: 97.5 kg 94.1 kg 102.1 kg    Examination:  Sitting in chair, NAD Clear to auscultation no wheezing Regular irregular S1-S2 no gallops Soft benign positive bowel sounds No positive edema bilaterally Alert oriented x4, grossly intact Mood and affect appropriate in current setting   Data Reviewed: I have personally reviewed following labs and imaging studies  CBC: Recent Labs  Lab 04/10/21 2258 04/13/21 0504  WBC 10.1 8.1  HGB 14.8 11.7*  HCT 44.2 34.4*  MCV 85.2 85.4  PLT 412* 329   Basic Metabolic Panel: Recent Labs  Lab 04/11/21 1908 04/11/21 2130 04/12/21 1041 04/13/21 0504 04/14/21 0657  NA 125* 125* 124* 127* 130*  K 4.5 4.1 3.9 3.8 4.3  CL 91* 90* 87* 92*  95*  CO2 22 22 23 24 23   GLUCOSE 148* 128* 180* 119* 148*  BUN 93* 98* 89* 88* 79*  CREATININE 4.57* 4.75* 4.98* 4.45* 3.74*  CALCIUM 8.9 8.9 8.4* 8.4* 8.7*   GFR: Estimated Creatinine Clearance: 14.5 mL/min (A) (by C-G formula based on SCr of 3.74 mg/dL (H)). Liver Function Tests: No results for input(s): AST, ALT, ALKPHOS, BILITOT, PROT, ALBUMIN in the last 168 hours. No results for input(s): LIPASE, AMYLASE in the last 168 hours. No results for input(s): AMMONIA in the last 168 hours. Coagulation Profile: No results for input(s): INR, PROTIME in the last 168 hours. Cardiac Enzymes: No results for input(s): CKTOTAL, CKMB, CKMBINDEX, TROPONINI in the last 168 hours. BNP (last 3 results) No results for input(s): PROBNP in the last 8760 hours. HbA1C: No results for input(s): HGBA1C in the last 72 hours. CBG: Recent Labs  Lab 04/13/21 1936 04/13/21 2238 04/14/21 0158 04/14/21 0816 04/14/21 1138  GLUCAP 198* 154* 147* 146* 204*   Lipid Profile: No results for input(s): CHOL, HDL, LDLCALC, TRIG, CHOLHDL, LDLDIRECT in the last 72 hours. Thyroid Function Tests: No results for input(s): TSH, T4TOTAL, FREET4, T3FREE, THYROIDAB in the last 72 hours.  Anemia Panel: No results for input(s): VITAMINB12, FOLATE, FERRITIN, TIBC, IRON, RETICCTPCT in the last 72 hours. Sepsis Labs: Recent Labs  Lab 04/11/21 0057 04/11/21 0210 04/11/21 0211  PROCALCITON  --  <0.10  --   LATICACIDVEN 2.4*  --  1.8    Recent Results (from the past 240 hour(s))  Resp Panel by RT-PCR (Flu A&B, Covid) Nasopharyngeal Swab     Status: None   Collection Time: 04/11/21 12:12 AM   Specimen: Nasopharyngeal Swab; Nasopharyngeal(NP) swabs in vial transport medium  Result Value Ref Range Status   SARS Coronavirus 2 by RT PCR NEGATIVE NEGATIVE Final    Comment: (NOTE) SARS-CoV-2 target nucleic acids are NOT DETECTED.  The SARS-CoV-2 RNA is generally detectable in upper respiratory specimens during the acute  phase of infection. The lowest concentration of SARS-CoV-2 viral copies this assay can detect is 138 copies/mL. A negative result does not preclude SARS-Cov-2 infection and should not be used as the sole basis for treatment or other patient management decisions. A negative result may occur with  improper specimen collection/handling, submission of specimen other than nasopharyngeal swab, presence of viral mutation(s) within the areas targeted by this assay, and inadequate number of viral copies(<138 copies/mL). A negative result must be combined with clinical observations, patient history, and epidemiological information. The expected result is Negative.  Fact Sheet for Patients:  EntrepreneurPulse.com.au  Fact Sheet for Healthcare Providers:  IncredibleEmployment.be  This test is no t yet approved or cleared by the [place] FDA and  has been authorized for detection and/or diagnosis of SARS-CoV-2 by FDA under an Emergency Use Authorization (EUA). This EUA will remain  in effect (meaning this test can be used) for the duration of the COVID-19 declaration under Section 564(b)(1) of the Act, 21 U.S.C.section 360bbb-3(b)(1), unless the authorization is terminated  or revoked sooner.       Influenza A by PCR NEGATIVE NEGATIVE Final   Influenza B by PCR NEGATIVE NEGATIVE Final    Comment: (NOTE) The Xpert Xpress SARS-CoV-2/FLU/RSV plus assay is intended as an aid in the diagnosis of influenza from Nasopharyngeal swab specimens and should not be used as a sole basis for treatment. Nasal washings and aspirates are unacceptable for Xpert Xpress SARS-CoV-2/FLU/RSV testing.  Fact Sheet for Patients: EntrepreneurPulse.com.au  Fact Sheet for Healthcare Providers: IncredibleEmployment.be  This test is not yet approved or cleared by the [place] FDA and has been authorized for detection and/or diagnosis of  SARS-CoV-2 by FDA under an Emergency Use Authorization (EUA). This EUA will remain in effect (meaning this test can be used) for the duration of the COVID-19 declaration under Section 564(b)(1) of the Act, 21 U.S.C. section 360bbb-3(b)(1), unless the authorization is terminated or revoked.  Performed at [facility], [address] [place], [place] [address]   Culture, blood (routine x 2)     Status: None (Preliminary result)   Collection Time: 04/11/21 12:57 AM   Specimen: Left Antecubital; Blood  Result Value Ref Range Status   Specimen Description LEFT ANTECUBITAL  Final   Special Requests   Final    BOTTLES DRAWN AEROBIC AND ANAEROBIC Blood Culture adequate volume   Culture   Final    NO GROWTH 3 DAYS Performed at [facility], [address], [place], [place] [address]    Report Status PENDING  Incomplete  Culture, blood (routine x 2)     Status: None (Preliminary result)   Collection Time: 04/11/21 12:57 AM   Specimen: Left Antecubital; Blood  Result Value Ref Range Status   Specimen Description LEFT ANTECUBITAL  Final   Special Requests   Final  BOTTLES DRAWN AEROBIC AND ANAEROBIC Blood Culture adequate volume   Culture   Final    NO GROWTH 3 DAYS Performed at [facility], [address] [place], [place] [address]    Report Status PENDING  Incomplete         Radiology Studies: No results found.      Scheduled Meds:  apixaban  5 mg Oral BID   feeding supplement  237 mL Oral TID BM   insulin aspart  0-9 Units Subcutaneous TID PC,HS,0200   lidocaine  1 patch Transdermal Q24H   metoprolol succinate  12.5 mg Oral Daily   midodrine  5 mg Oral TID WC   multivitamin with minerals  1 tablet Oral Daily   Continuous Infusions:  sodium chloride 100 mL/hr at 04/14/21 1430    Assessment & Plan:   Principal Problem:   Hypotension Active Problems:   Type 2 diabetes mellitus with renal complication (HCC)   Atrial fibrillation  (HCC)   Acute kidney injury superimposed on CKD (HCC)   Hyponatremia   Metabolic acidosis   (HFpEF) heart failure with preserved ejection fraction (HCC)   Pre-syncope secondary to hypotension - Likely due to hypotension/hypovolemia  6/25 blood pressure improved with IV fluids and midodrine Will resume her Toprol XL at a lower dose since heart rate is variable     Severe AKI on CKD stage 3b -Creatinine significantly elevated up to 4.19 with BUN of 88 from a prior creatinine of 1.62.  Based on previous nephrology documentation appears that she has been having some progression of her renal disease.   -Suspect this is now acutely worsened due to dehydration with lack of p.o. intake and torsemide use.  She also endorses self adjusting her regimen of metformin and taking increased doses 6/25 creatinine improving, today 3.74 Nephrology following Continue IV fluids Renal ultrasound no acute abnormality or hydronephrosis Continue to hold torsemide and ACE inhibitor Monitor volume status as she has been getting IV fluids for hydration      Hyponatremia Secondary to hypovolemia Sodium level improving at 130 Continue IV fluids, encourage p.o. intake    Type 2 diabetes -Last Hb A1C in March at 7.3% 6/25-bg stable.  Riss    Questionable pneumonia On chest x-ray.  Could be just from effusion and possible atelectasis.  Her procalcitonin is negative  no leukocytosis.  Remains afebrile.  We will hold off on antibiotics Incentive spirometer    Metabolic acidosis -Bicarb of 17 likely due to worsening renal insufficency Improved with IV fluids       Atrial fibrillation -CHA2DS2-VASc score of 5 - Hold Xarelto and switched to Eliquis for now due to worsening renal function    HFpEF -EF 60-65% on echo 11/2019.   no acute exacerbation.  Monitor closely as she is receiving IV fluids  Decrease IVF to 71ml/hr as she is getting little swollen in LE. Discussed it with nephrology today      DVT prophylaxis: Eliquis Code Status: Full Family Communication: None at bedside Disposition Plan:  Status is: Inpatient  Remains inpatient appropriate because:Inpatient level of care appropriate due to severity of illness  Dispo: The patient is from: Home              Anticipated d/c is to: Home              Patient currently is not medically stable to d/c.   Difficult to place patient No  LOS: 3 days   Time spent: 35 minutes with more than 50% on [place], MD [facility] Pager [phone]  If 7PM-7AM, please contact night-coverage 04/14/2021, 3:18 PM

## 2021-04-15 LAB — BASIC METABOLIC PANEL
Anion gap: 10 (ref 5–15)
BUN: 64 mg/dL — ABNORMAL HIGH (ref 8–23)
CO2: 27 mmol/L (ref 22–32)
Calcium: 9.1 mg/dL (ref 8.9–10.3)
Chloride: 97 mmol/L — ABNORMAL LOW (ref 98–111)
Creatinine, Ser: 2.98 mg/dL — ABNORMAL HIGH (ref 0.44–1.00)
GFR, Estimated: 16 mL/min — ABNORMAL LOW (ref >60)
Glucose, Bld: 164 mg/dL — ABNORMAL HIGH (ref 70–99)
Potassium: 4.7 mmol/L (ref 3.5–5.1)
Sodium: 134 mmol/L — ABNORMAL LOW (ref 135–145)

## 2021-04-15 LAB — GLUCOSE, CAPILLARY
Glucose-Capillary: 132 mg/dL — ABNORMAL HIGH (ref 70–99)
Glucose-Capillary: 144 mg/dL — ABNORMAL HIGH (ref 70–99)
Glucose-Capillary: 187 mg/dL — ABNORMAL HIGH (ref 70–99)
Glucose-Capillary: 144 mg/dL — ABNORMAL HIGH (ref 70–99)

## 2021-04-15 MED ORDER — METOPROLOL SUCCINATE ER 25 MG PO TB24
25.0000 mg | ORAL_TABLET | Freq: Two times a day (BID) | ORAL | Status: DC
  Administered 2021-04-15 – 2021-04-18 (×6): 25 mg via ORAL
  Filled 2021-04-15 (×6): qty 1

## 2021-04-15 MED ORDER — METOPROLOL SUCCINATE ER 25 MG PO TB24
12.5000 mg | ORAL_TABLET | Freq: Once | ORAL | Status: AC
  Administered 2021-04-15: 12.5 mg via ORAL
  Filled 2021-04-15: qty 1

## 2021-04-15 MED ORDER — FLEET ENEMA 7-19 GM/118ML RE ENEM: 1.0000 | ENEMA | Freq: Every day | RECTAL | Status: DC | PRN

## 2021-04-15 MED ORDER — POLYETHYLENE GLYCOL 3350 17 G PO PACK
17.0000 g | PACK | Freq: Every day | ORAL | Status: DC
  Administered 2021-04-15 – 2021-04-18 (×4): 17 g via ORAL
  Filled 2021-04-15 (×4): qty 1

## 2021-04-15 MED ORDER — SENNOSIDES-DOCUSATE SODIUM 8.6-50 MG PO TABS
2.0000 | ORAL_TABLET | Freq: Two times a day (BID) | ORAL | Status: DC
  Administered 2021-04-15 – 2021-04-18 (×6): 2 via ORAL
  Filled 2021-04-15 (×7): qty 2

## 2021-04-15 NOTE — TOC Initial Note (Signed)
Transition of Care [facility]) - Initial/Assessment Note    Patient Details  Name: LAMIKA CONNOLLY MRN: [id number] Date of Birth: 04/20/1946  Transition of Care [facility]) CM/SW Contact:    Beverly Sessions, RN Phone Number: 04/15/2021, 1:36 PM  Clinical Narrative:                 Patient admitted from home with hypotension Patient lives at home with husband  PCP Wynetta Emery - Husband transports to appointments Pharmacy - [facility]  PT and OT recommending home health services.  Patient not sure if she would like services at discharge or not.  She is in agreement for TOC to follow up.  Patient currently has RW, cane, and shower seat in the home  Expected Discharge Plan: [place]     Patient Goals and CMS Choice        Expected Discharge Plan and Services Expected Discharge Plan: [place]       Living arrangements for the past 2 months: Single Family Home                                      Prior Living Arrangements/Services Living arrangements for the past 2 months: Single Family Home Lives with:: Spouse   Do you feel safe going back to the place where you live?: Yes      Need for Family Participation in Patient Care: Yes (Comment) Care giver support system in place?: Yes (comment) Current home services: DME Criminal Activity/Legal Involvement Pertinent to Current Situation/Hospitalization: No - Comment as needed  Activities of Daily Living Home Assistive Devices/Equipment: Cane (specify quad or straight), Walker (specify type), Blood pressure cuff, CBG Meter, Scales ADL Screening (condition at time of admission) Patient's cognitive ability adequate to safely complete daily activities?: Yes Is the patient deaf or have difficulty hearing?: No Does the patient have difficulty seeing, even when wearing glasses/contacts?: No Does the patient have difficulty concentrating, remembering, or making decisions?: No Patient able to  express need for assistance with ADLs?: Yes Does the patient have difficulty dressing or bathing?: No Independently performs ADLs?: Yes (appropriate for developmental age) Does the patient have difficulty walking or climbing stairs?: Yes Weakness of Legs: None Weakness of Arms/Hands: None  Permission Sought/Granted                  Emotional Assessment       Orientation: : Oriented to Self, Oriented to Place, Oriented to  Time, Oriented to Situation Alcohol / Substance Use: Not Applicable Psych Involvement: No (comment)  Admission diagnosis:  Dizziness [R42] Hyponatremia [E87.1] SIRS (systemic inflammatory response syndrome) (HCC) [R65.10] AKI (acute kidney injury) ([place]) [N17.9] Hypotension [I95.9] Acute renal failure, unspecified acute renal failure type ([place]) [N17.9] Syncope, unspecified syncope type [R55] Community acquired pneumonia of left lower lobe of lung [J18.9] Patient Active Problem List   Diagnosis Date Noted   Hypotension 04/11/2021   Acute kidney injury superimposed on CKD ([place]) 04/11/2021   Hyponatremia 19/37/9024   Metabolic acidosis 09/73/5329   (HFpEF) heart failure with preserved ejection fraction ([place]) 04/11/2021   Chronic venous insufficiency 08/14/2020   PAD (peripheral artery disease) ([place]) 08/14/2020   Bilateral primary osteoarthritis of knee 04/13/2020   Type 2 diabetes mellitus ([place]) 04/13/2020   Acquired spondylolisthesis 12/17/2019   Osteoarthritis of knee 12/17/2019   Lumbar radiculopathy 12/17/2019   New onset atrial fibrillation ([place])  11/20/2019   Atrial fibrillation ([place]) 11/20/2019   Closed fracture of radial styloid 08/06/2019   Displaced fracture of unspecified radial styloid process, initial encounter for closed fracture 08/06/2019   Controlled substance agreement signed 01/24/2018   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   Piriformis syndrome 07/01/2016   Sacroiliac joint dysfunction 01/09/2016   Joint  disorder, unspecified 01/09/2016   Low back pain 01/09/2016   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 diabetes mellitus ([place]) 04/28/2015   Chronic kidney disease, stage 3 unspecified ([place]) 04/28/2015   Lymphedema 03/27/2015   Edema 03/27/2015   Open-angle glaucoma, mild stage    Osteoarthritis of both knees    Type 2 diabetes mellitus with renal complication (HCC)    Osteopenia    Benign hypertensive renal disease    Hyperlipidemia    Degeneration of lumbosacral intervertebral disc    PCP:  Valerie Roys, DO Pharmacy:   [facility] [place], [address] [place] [address] Phone: [phone] Fax: [phone]     Social Determinants of Health (SDOH) Interventions    Readmission Risk Interventions No flowsheet data found.

## 2021-04-15 NOTE — Progress Notes (Signed)
PROGRESS NOTE    Karen Farley  NUU:[id number] DOB: 21-Jul-1946 DOA: 04/10/2021 PCP: Valerie Roys, DO    Brief Narrative:  Karen Farley is a 75 y.o. female with medical history significant for HFpEF (EF 60-65% on echo 11/2019), atrial fibrillation on Xarelto, chronic venous insufficiency, PAD, type 2 diabetes, CKD stage IIIb who presents with concerns of presyncopal episode.  Found with AKI on top of chronic kidney disease.  Hypotensive.    6/23 renal function worsened. 6/24-feels better, feels has more color. No acute sob  or cp. 6/25-feels tired today. Creatinine at 3.74. on tele afib with variable HR 109-122 6/26-feels fatigue. No acute sob. No cp. Feels constipated. Cr down to 2.98  Consultants:  Nephrology  Procedures:   Antimicrobials:      Subjective: No shortness of breath or chest pain  Objective: Vitals:   04/14/21 1526 04/14/21 2114 04/15/21 0315 04/15/21 0718  BP: 104/81 104/78 (!) 134/100 115/75  Pulse: 75 94 82 (!) 120  Resp: 18 20 19 20   Temp: 97.6 F (36.4 C) 97.7 F (36.5 C) 97.7 F (36.5 C) (!) 97.5 F (36.4 C)  TempSrc:      SpO2: 97% 99% 97% 93%  Weight:      Height:        Intake/Output Summary (Last 24 hours) at 04/15/2021 0833 Last data filed at 04/15/2021 0400 Gross per 24 hour  Intake 2560.58 ml  Output 952 ml  Net 1608.58 ml   Filed Weights   04/10/21 2246 04/12/21 0356 04/14/21 0200  Weight: 97.5 kg 94.1 kg 102.1 kg    Examination: NAD, calm CTA no rales Irregular, S1-S2 no gallops Soft benign positive bowel sounds Positive edema bilaterally Awake alert and oriented x4 grossly intact Mood and affect appropriate in current setting   Data Reviewed: I have personally reviewed following labs and imaging studies  CBC: Recent Labs  Lab 04/10/21 2258 04/13/21 0504  WBC 10.1 8.1  HGB 14.8 11.7*  HCT 44.2 34.4*  MCV 85.2 85.4  PLT 412* 347   Basic Metabolic Panel: Recent Labs  Lab 04/11/21 1908  04/11/21 2130 04/12/21 1041 04/13/21 0504 04/14/21 0657  NA 125* 125* 124* 127* 130*  K 4.5 4.1 3.9 3.8 4.3  CL 91* 90* 87* 92* 95*  CO2 22 22 23 24 23   GLUCOSE 148* 128* 180* 119* 148*  BUN 93* 98* 89* 88* 79*  CREATININE 4.57* 4.75* 4.98* 4.45* 3.74*  CALCIUM 8.9 8.9 8.4* 8.4* 8.7*   GFR: Estimated Creatinine Clearance: 14.5 mL/min (A) (by C-G formula based on SCr of 3.74 mg/dL (H)). Liver Function Tests: No results for input(s): AST, ALT, ALKPHOS, BILITOT, PROT, ALBUMIN in the last 168 hours. No results for input(s): LIPASE, AMYLASE in the last 168 hours. No results for input(s): AMMONIA in the last 168 hours. Coagulation Profile: No results for input(s): INR, PROTIME in the last 168 hours. Cardiac Enzymes: No results for input(s): CKTOTAL, CKMB, CKMBINDEX, TROPONINI in the last 168 hours. BNP (last 3 results) No results for input(s): PROBNP in the last 8760 hours. HbA1C: No results for input(s): HGBA1C in the last 72 hours. CBG: Recent Labs  Lab 04/14/21 0816 04/14/21 1138 04/14/21 1650 04/14/21 2116 04/15/21 0807  GLUCAP 146* 204* 144* 142* 132*   Lipid Profile: No results for input(s): CHOL, HDL, LDLCALC, TRIG, CHOLHDL, LDLDIRECT in the last 72 hours. Thyroid Function Tests: No results for input(s): TSH, T4TOTAL, FREET4, T3FREE, THYROIDAB in the last 72 hours.  Anemia Panel: No  results for input(s): VITAMINB12, FOLATE, FERRITIN, TIBC, IRON, RETICCTPCT in the last 72 hours. Sepsis Labs: Recent Labs  Lab 04/11/21 0057 04/11/21 0210 04/11/21 0211  PROCALCITON  --  <0.10  --   LATICACIDVEN 2.4*  --  1.8    Recent Results (from the past 240 hour(s))  Resp Panel by RT-PCR (Flu A&B, Covid) Nasopharyngeal Swab     Status: None   Collection Time: 04/11/21 12:12 AM   Specimen: Nasopharyngeal Swab; Nasopharyngeal(NP) swabs in vial transport medium  Result Value Ref Range Status   SARS Coronavirus 2 by RT PCR NEGATIVE NEGATIVE Final    Comment:  (NOTE) SARS-CoV-2 target nucleic acids are NOT DETECTED.  The SARS-CoV-2 RNA is generally detectable in upper respiratory specimens during the acute phase of infection. The lowest concentration of SARS-CoV-2 viral copies this assay can detect is 138 copies/mL. A negative result does not preclude SARS-Cov-2 infection and should not be used as the sole basis for treatment or other patient management decisions. A negative result may occur with  improper specimen collection/handling, submission of specimen other than nasopharyngeal swab, presence of viral mutation(s) within the areas targeted by this assay, and inadequate number of viral copies(<138 copies/mL). A negative result must be combined with clinical observations, patient history, and epidemiological information. The expected result is Negative.  Fact Sheet for Patients:  EntrepreneurPulse.com.au  Fact Sheet for Healthcare Providers:  IncredibleEmployment.be  This test is no t yet approved or cleared by the [place] FDA and  has been authorized for detection and/or diagnosis of SARS-CoV-2 by FDA under an Emergency Use Authorization (EUA). This EUA will remain  in effect (meaning this test can be used) for the duration of the COVID-19 declaration under Section 564(b)(1) of the Act, 21 U.S.C.section 360bbb-3(b)(1), unless the authorization is terminated  or revoked sooner.       Influenza A by PCR NEGATIVE NEGATIVE Final   Influenza B by PCR NEGATIVE NEGATIVE Final    Comment: (NOTE) The Xpert Xpress SARS-CoV-2/FLU/RSV plus assay is intended as an aid in the diagnosis of influenza from Nasopharyngeal swab specimens and should not be used as a sole basis for treatment. Nasal washings and aspirates are unacceptable for Xpert Xpress SARS-CoV-2/FLU/RSV testing.  Fact Sheet for Patients: EntrepreneurPulse.com.au  Fact Sheet for Healthcare  Providers: IncredibleEmployment.be  This test is not yet approved or cleared by the [place] FDA and has been authorized for detection and/or diagnosis of SARS-CoV-2 by FDA under an Emergency Use Authorization (EUA). This EUA will remain in effect (meaning this test can be used) for the duration of the COVID-19 declaration under Section 564(b)(1) of the Act, 21 U.S.C. section 360bbb-3(b)(1), unless the authorization is terminated or revoked.  Performed at [facility], [address] [place], [place] [address]   Culture, blood (routine x 2)     Status: None (Preliminary result)   Collection Time: 04/11/21 12:57 AM   Specimen: Left Antecubital; Blood  Result Value Ref Range Status   Specimen Description LEFT ANTECUBITAL  Final   Special Requests   Final    BOTTLES DRAWN AEROBIC AND ANAEROBIC Blood Culture adequate volume   Culture   Final    NO GROWTH 3 DAYS Performed at [facility], [address] [place], [place] [address]    Report Status PENDING  Incomplete  Culture, blood (routine x 2)     Status: None (Preliminary result)   Collection Time: 04/11/21 12:57 AM   Specimen: Left Antecubital; Blood  Result Value  Ref Range Status   Specimen Description LEFT ANTECUBITAL  Final   Special Requests   Final    BOTTLES DRAWN AEROBIC AND ANAEROBIC Blood Culture adequate volume   Culture   Final    NO GROWTH 3 DAYS Performed at [facility], [address] [place], [place] [address]    Report Status PENDING  Incomplete         Radiology Studies: No results found.      Scheduled Meds:  apixaban  5 mg Oral BID   feeding supplement  237 mL Oral TID BM   insulin aspart  0-9 Units Subcutaneous TID PC,HS,0200   lidocaine  1 patch Transdermal Q24H   metoprolol succinate  12.5 mg Oral Daily   midodrine  5 mg Oral TID WC   multivitamin with minerals  1 tablet Oral Daily   Continuous Infusions:  sodium chloride 75  mL/hr at 04/15/21 0256    Assessment & Plan:   Principal Problem:   Hypotension Active Problems:   Type 2 diabetes mellitus with renal complication (HCC)   Atrial fibrillation (HCC)   Acute kidney injury superimposed on CKD ([place])   Hyponatremia   Metabolic acidosis   (HFpEF) heart failure with preserved ejection fraction (HCC)   Pre-syncope secondary to hypotension - Likely due to hypotension/hypovolemia  6/26 blood pressure improved with IV fluids and midodrine      Severe AKI on CKD stage 3b -Creatinine significantly elevated up to 4.19 with BUN of 88 from a prior creatinine of 1.62.  Based on previous nephrology documentation appears that she has been having some progression of her renal disease.   -Suspect this is now acutely worsened due to dehydration with lack of p.o. intake and torsemide use.  She also endorses self adjusting her regimen of metformin and taking increased doses 6/26 creatinine going down to 2.98 Nephrology following Decrease IV fluids to 50 MLS per hour to prevent fluid overload Renal ultrasound no acute abnormality of hydronephrosis Continue to hold torsemide and ACE inhibitor's Avoid nephrotoxic meds  Hyponatremia Secondary to hypovolemia 6/26 improving with IV fluids Sodium today 134   Type 2 diabetes -Last Hb A1C in March at 7.3% 6/26 BG stable Continue R-ISS   Constipation Will add bowel regimen    Atrial fibrillation -CHA2DS2-VASc score of 5 - Hold Xarelto and switched to Eliquis for now due to worsening renal function  6/26 heart rate variable, mildly on the tacky side Will increase Toprol-XL to 25 mg twice daily    HFpEF -EF 60-65% on echo 11/2019.   no acute exacerbation.  Monitor closely as she is receiving IV fluids  6/26 decrease IV fluids to 50 mils per hour        Questionable pneumonia On chest x-ray.  Could be just from effusion and possible atelectasis.  Her procalcitonin is negative  no leukocytosis.   Remains afebrile.  We will hold off on antibiotics Incentive spirometer    Metabolic acidosis -Bicarb of 17 likely due to worsening renal insufficency Improved with IV fluids         DVT prophylaxis: Eliquis Code Status: Full Family Communication: None at bedside Disposition Plan:  Status is: Inpatient  Remains inpatient appropriate because:Inpatient level of care appropriate due to severity of illness  Dispo: The patient is from: Home              Anticipated d/c is to: Home              Patient  currently is not medically stable to d/c.   Difficult to place patient No            LOS: 4 days   Time spent: 45 minutes with more than 50% on [place]la, MD [facility] Pager [phone]  If 7PM-7AM, please contact night-coverage 04/15/2021, 8:33 AM

## 2021-04-15 NOTE — Progress Notes (Signed)
[facility]  PROGRESS NOTE   Subjective:   Out of bed to chair.  Patient has been drinking Ensure up to 3 bottles a day. Breathing has improved.  Objective:  Vital signs in last 24 hours:  Temp:  [97.5 F (36.4 C)-97.7 F (36.5 C)] 97.7 F (36.5 C) (06/26 1100) Pulse Rate:  [75-120] 99 (06/26 1100) Resp:  [18-20] 18 (06/26 1100) BP: (104-144)/(75-100) 144/83 (06/26 1100) SpO2:  [93 %-99 %] 98 % (06/26 1100)  Weight change:  Filed Weights   04/10/21 2246 04/12/21 0356 04/14/21 0200  Weight: 97.5 kg 94.1 kg 102.1 kg    Intake/Output: I/O last 3 completed shifts: In: 3360.4 [P.O.:555; I.V.:2805.4] Out: 1660 [Urine:1350; Stool:2]   Intake/Output this shift:  Total I/O In: 240 [P.O.:240] Out: 150 [Urine:150]  Physical Exam: General:  No acute distress  Head:  Normocephalic, atraumatic. Moist oral mucosal membranes  Eyes:  Anicteric  Neck:  Supple  Lungs:   Clear to auscultation, normal effort  Heart:  S1S2 no rubs  Abdomen:   Soft, nontender, bowel sounds present  Extremities: 1+ peripheral edema.  Neurologic:  Awake, alert, following commands  Skin:  No lesions  Access:     Basic Metabolic Panel: Recent Labs  Lab 04/11/21 2130 04/12/21 1041 04/13/21 0504 04/14/21 0657 04/15/21 1034  NA 125* 124* 127* 130* 134*  K 4.1 3.9 3.8 4.3 4.7  CL 90* 87* 92* 95* 97*  CO2 22 23 24 23 27   GLUCOSE 128* 180* 119* 148* 164*  BUN 98* 89* 88* 79* 64*  CREATININE 4.75* 4.98* 4.45* 3.74* 2.98*  CALCIUM 8.9 8.4* 8.4* 8.7* 9.1    CBC: Recent Labs  Lab 04/10/21 2258 04/13/21 0504  WBC 10.1 8.1  HGB 14.8 11.7*  HCT 44.2 34.4*  MCV 85.2 85.4  PLT 412* 341     Urinalysis: No results for input(s): COLORURINE, LABSPEC, PHURINE, GLUCOSEU, HGBUR, BILIRUBINUR, KETONESUR, PROTEINUR, UROBILINOGEN, NITRITE, LEUKOCYTESUR in the last 72 hours.  Invalid input(s): APPERANCEUR    Imaging: No results found.   Medications:    sodium chloride 75 mL/hr at  04/15/21 0256    apixaban  5 mg Oral BID   feeding supplement  237 mL Oral TID BM   insulin aspart  0-9 Units Subcutaneous TID PC,HS,0200   lidocaine  1 patch Transdermal Q24H   metoprolol succinate  12.5 mg Oral Daily   midodrine  5 mg Oral TID WC   multivitamin with minerals  1 tablet Oral Daily   polyethylene glycol  17 g Oral Daily   senna-docusate  2 tablet Oral BID    Assessment/ Plan:     Principal Problem:   Hypotension Active Problems:   Type 2 diabetes mellitus with renal complication (HCC)   Atrial fibrillation (HCC)   Acute kidney injury superimposed on CKD (HCC)   Hyponatremia   Metabolic acidosis   (HFpEF) heart failure with preserved ejection fraction [facility])  75 year old female with history of diabetes, peripheral vascular disease, hypertension, hyperlipidemia, atrial fibrillation and congestive heart failure now admitted with history of fluid overload and also pneumonia.  Patient also developed acute kidney injury on top of chronic kidney disease. She has baseline creatinine of about 1.7 with a GFR of about 35 cc/min.  Creatinine has now improved from 4.98-2.98 today.  Patient is advised on the importance of strict blood sugar control.   Dietary advice given to the patient bedside. We will continue to follow closely during hospitalization. Spoke to the patient and answered  all their questions.   LOS: [place], MD [facility] 6/26/202212:01 PM

## 2021-04-16 LAB — CULTURE, BLOOD (ROUTINE X 2)
Culture: NO GROWTH
Culture: NO GROWTH
Special Requests: ADEQUATE
Special Requests: ADEQUATE

## 2021-04-16 LAB — RENAL FUNCTION PANEL
Albumin: 3.2 g/dL — ABNORMAL LOW (ref 3.5–5.0)
Anion gap: 10 (ref 5–15)
BUN: 53 mg/dL — ABNORMAL HIGH (ref 8–23)
CO2: 24 mmol/L (ref 22–32)
Calcium: 9.1 mg/dL (ref 8.9–10.3)
Chloride: 101 mmol/L (ref 98–111)
Creatinine, Ser: 2.41 mg/dL — ABNORMAL HIGH (ref 0.44–1.00)
GFR, Estimated: 20 mL/min — ABNORMAL LOW (ref >60)
Glucose, Bld: 143 mg/dL — ABNORMAL HIGH (ref 70–99)
Phosphorus: 2.8 mg/dL (ref 2.5–4.6)
Potassium: 4.9 mmol/L (ref 3.5–5.1)
Sodium: 135 mmol/L (ref 135–145)

## 2021-04-16 LAB — GLUCOSE, CAPILLARY
Glucose-Capillary: 161 mg/dL — ABNORMAL HIGH (ref 70–99)
Glucose-Capillary: 123 mg/dL — ABNORMAL HIGH (ref 70–99)
Glucose-Capillary: 129 mg/dL — ABNORMAL HIGH (ref 70–99)

## 2021-04-16 MED ORDER — MIDODRINE HCL 5 MG PO TABS
5.0000 mg | ORAL_TABLET | Freq: Once | ORAL | Status: AC
  Administered 2021-04-16: 5 mg via ORAL
  Filled 2021-04-16: qty 1

## 2021-04-16 MED ORDER — MIDODRINE HCL 5 MG PO TABS
10.0000 mg | ORAL_TABLET | Freq: Three times a day (TID) | ORAL | Status: DC
  Administered 2021-04-17 – 2021-04-18 (×4): 10 mg via ORAL
  Filled 2021-04-16 (×4): qty 2

## 2021-04-16 MED ORDER — SODIUM CHLORIDE 0.9 % IV SOLN: INTRAVENOUS | Status: DC

## 2021-04-16 NOTE — Progress Notes (Signed)
Occupational Therapy Treatment Patient Details Name: Karen Farley MRN: [id number] DOB: 09/17/1946 Today's Date: 04/16/2021    History of present illness 75 y.o. female with medical history significant for HFpEF (EF 60-65% on echo 11/2019), atrial fibrillation on Xarelto, chronic venous insufficiency, PAD, type 2 diabetes, CKD stage IIIb who presents with concerns of presyncopal episode.  Found with AKI on top of chronic kidney disease.  Hypotensive.   OT comments  Upon entering the room, pt seated on commode attempting to have BM with husband present in the room. Pt able to perform hygiene while seated with set up A to obtain all needed items. Pt standing from commode with close supervision and min guard for balance with clothing management. Pt standing at sink for hand hygiene with supervision and ambulating 10' with RW back to recliner chair with close supervision. No LOB. OT discussed energy conservation briefly during this session with plans to continue education. Pt remains in recliner chair at end of session with all needs within reach but fatigued. Husband remains present. Pt continues to benefit from OT intervention this session.    Follow Up Recommendations  Home health OT;Supervision - Intermittent    Equipment Recommendations  None recommended by OT    Recommendations for Other Services      Precautions / Restrictions Precautions Precautions: Fall       Mobility Bed Mobility               General bed mobility comments: recieved in bathroom and in recliner chair at end of session.    Transfers Overall transfer level: Needs assistance Equipment used: Rolling walker (2 wheeled) Transfers: Sit to/from Stand Sit to Stand: Supervision Stand pivot transfers: Supervision            Balance Overall balance assessment: Needs assistance Sitting-balance support: No upper extremity supported;Feet supported Sitting balance-Leahy Scale: Good     Standing balance  support: Bilateral upper extremity supported Standing balance-Leahy Scale: Fair Standing balance comment: needing UE support                           ADL either performed or assessed with clinical judgement   ADL Overall ADL's : Needs assistance/impaired     Grooming: Wash/dry hands;Wash/dry face;Standing;Supervision/safety                   Toilet Transfer: Sales executive;Ambulation;RW   Toileting- Water quality scientist and Hygiene: Min guard;Sit to/from stand       Functional mobility during ADLs: Supervision/safety;Min guard;Rolling walker       Vision Patient Visual Report: No change from baseline            Cognition Arousal/Alertness: Awake/alert Behavior During Therapy: WFL for tasks assessed/performed Overall Cognitive Status: Within Functional Limits for tasks assessed                                                     Pertinent Vitals/ Pain       Pain Assessment: No/denies pain         Frequency  Min 2X/week        Progress Toward Goals  OT Goals(current goals can now be found in the care plan section)  Progress towards OT goals: Progressing toward goals  Acute Rehab OT Goals Patient Stated Goal: to  return home OT Goal Formulation: With patient Time For Goal Achievement: 04/27/21 Potential to Achieve Goals: [place] Discharge plan remains appropriate    Co-evaluation                 AM-PAC OT "6 Clicks" Daily Activity     Outcome Measure   Help from another person eating meals?: None Help from another person taking care of personal grooming?: None Help from another person toileting, which includes using toliet, bedpan, or urinal?: A Little Help from another person bathing (including washing, rinsing, drying)?: A Little Help from another person to put on and taking off regular upper body clothing?: None Help from another person to put on and taking off regular lower  body clothing?: A Little 6 Click Score: 21    End of Session    OT Visit Diagnosis: Unsteadiness on feet (R26.81);Muscle weakness (generalized) (M62.81)   Activity Tolerance Patient tolerated treatment well   Patient Left in chair;with call bell/phone within reach;with chair alarm set;with family/visitor present   Nurse Communication Mobility status        Time: 8343-7357 OT Time Calculation (min): 23 min  Charges: OT General Charges $OT Visit: 1 Visit OT Treatments $Self Care/Home Management : 23-37 mins  Darleen Crocker, MS, OTR/L , CBIS ascom [phone]  04/16/21, 3:14 PM

## 2021-04-16 NOTE — Care Management Important Message (Signed)
Important Message  Patient Details  Name: SABLE KNOLES MRN: [id number] Date of Birth: [birth year]   Medicare Important Message Given:  Yes     Dannette Barbara 04/16/2021, 1:19 PM

## 2021-04-16 NOTE — Progress Notes (Signed)
PROGRESS NOTE    Karen Farley  GLO:[id number] DOB: 19-Jul-1946 DOA: 04/10/2021 PCP: Valerie Roys, DO    Brief Narrative:  Karen Farley is a 75 y.o. female with medical history significant for HFpEF (EF 60-65% on echo 11/2019), atrial fibrillation on Xarelto, chronic venous insufficiency, PAD, type 2 diabetes, CKD stage IIIb who presents with concerns of presyncopal episode.  Found with AKI on top of chronic kidney disease.  Hypotensive.    6/23 renal function worsened. 6/24-feels better, feels has more color. No acute sob  or cp. 6/25-feels tired today. Creatinine at 3.74. on tele afib with variable HR 109-122 6/26-feels fatigue. No acute sob. No cp. Feels constipated. Cr down to 2.98  6/27-she feels much better today. Had bath and had bm.creatinine 2.41  Consultants:  Nephrology  Procedures:   Antimicrobials:      Subjective: No acute sob, cp  Objective: Vitals:   04/15/21 1632 04/15/21 2033 04/16/21 0415 04/16/21 0728  BP:  (!) 142/92 (!) 124/93 109/69  Pulse: 95 (!) 103 (!) 105 (!) 106  Resp:  18 18 17   Temp:  98.1 F (36.7 C) 98.1 F (36.7 C) 97.7 F (36.5 C)  TempSrc:      SpO2:  97% 97% 97%  Weight:      Height:        Intake/Output Summary (Last 24 hours) at 04/16/2021 0858 Last data filed at 04/16/2021 0600 Gross per 24 hour  Intake 2133.85 ml  Output 1050 ml  Net 1083.85 ml   Filed Weights   04/10/21 2246 04/12/21 0356 04/14/21 0200  Weight: 97.5 kg 94.1 kg 102.1 kg    Examination: Sitting in chair, calm nad Cta no w/r Irreg , s1/s2 no gallop Soft benign +bs +edema b/l Aaxox4    Data Reviewed: I have personally reviewed following labs and imaging studies  CBC: Recent Labs  Lab 04/10/21 2258 04/13/21 0504  WBC 10.1 8.1  HGB 14.8 11.7*  HCT 44.2 34.4*  MCV 85.2 85.4  PLT 412* 188   Basic Metabolic Panel: Recent Labs  Lab 04/12/21 1041 04/13/21 0504 04/14/21 0657 04/15/21 1034 04/16/21 0547  NA 124* 127* 130*  134* 135  K 3.9 3.8 4.3 4.7 4.9  CL 87* 92* 95* 97* 101  CO2 23 24 23 27 24   GLUCOSE 180* 119* 148* 164* 143*  BUN 89* 88* 79* 64* 53*  CREATININE 4.98* 4.45* 3.74* 2.98* 2.41*  CALCIUM 8.4* 8.4* 8.7* 9.1 9.1  PHOS  --   --   --   --  2.8   GFR: Estimated Creatinine Clearance: 22.6 mL/min (A) (by C-G formula based on SCr of 2.41 mg/dL (H)). Liver Function Tests: Recent Labs  Lab 04/16/21 0547  ALBUMIN 3.2*   No results for input(s): LIPASE, AMYLASE in the last 168 hours. No results for input(s): AMMONIA in the last 168 hours. Coagulation Profile: No results for input(s): INR, PROTIME in the last 168 hours. Cardiac Enzymes: No results for input(s): CKTOTAL, CKMB, CKMBINDEX, TROPONINI in the last 168 hours. BNP (last 3 results) No results for input(s): PROBNP in the last 8760 hours. HbA1C: No results for input(s): HGBA1C in the last 72 hours. CBG: Recent Labs  Lab 04/14/21 2116 04/15/21 0807 04/15/21 1136 04/15/21 1634 04/15/21 2035  GLUCAP 142* 132* 144* 187* 144*   Lipid Profile: No results for input(s): CHOL, HDL, LDLCALC, TRIG, CHOLHDL, LDLDIRECT in the last 72 hours. Thyroid Function Tests: No results for input(s): TSH, T4TOTAL, FREET4, T3FREE, THYROIDAB in  the last 72 hours.  Anemia Panel: No results for input(s): VITAMINB12, FOLATE, FERRITIN, TIBC, IRON, RETICCTPCT in the last 72 hours. Sepsis Labs: Recent Labs  Lab 04/11/21 0057 04/11/21 0210 04/11/21 0211  PROCALCITON  --  <0.10  --   LATICACIDVEN 2.4*  --  1.8    Recent Results (from the past 240 hour(s))  Resp Panel by RT-PCR (Flu A&B, Covid) Nasopharyngeal Swab     Status: None   Collection Time: 04/11/21 12:12 AM   Specimen: Nasopharyngeal Swab; Nasopharyngeal(NP) swabs in vial transport medium  Result Value Ref Range Status   SARS Coronavirus 2 by RT PCR NEGATIVE NEGATIVE Final    Comment: (NOTE) SARS-CoV-2 target nucleic acids are NOT DETECTED.  The SARS-CoV-2 RNA is generally detectable in  upper respiratory specimens during the acute phase of infection. The lowest concentration of SARS-CoV-2 viral copies this assay can detect is 138 copies/mL. A negative result does not preclude SARS-Cov-2 infection and should not be used as the sole basis for treatment or other patient management decisions. A negative result may occur with  improper specimen collection/handling, submission of specimen other than nasopharyngeal swab, presence of viral mutation(s) within the areas targeted by this assay, and inadequate number of viral copies(<138 copies/mL). A negative result must be combined with clinical observations, patient history, and epidemiological information. The expected result is Negative.  Fact Sheet for Patients:  EntrepreneurPulse.com.au  Fact Sheet for Healthcare Providers:  IncredibleEmployment.be  This test is no t yet approved or cleared by the [place] FDA and  has been authorized for detection and/or diagnosis of SARS-CoV-2 by FDA under an Emergency Use Authorization (EUA). This EUA will remain  in effect (meaning this test can be used) for the duration of the COVID-19 declaration under Section 564(b)(1) of the Act, 21 U.S.C.section 360bbb-3(b)(1), unless the authorization is terminated  or revoked sooner.       Influenza A by PCR NEGATIVE NEGATIVE Final   Influenza B by PCR NEGATIVE NEGATIVE Final    Comment: (NOTE) The Xpert Xpress SARS-CoV-2/FLU/RSV plus assay is intended as an aid in the diagnosis of influenza from Nasopharyngeal swab specimens and should not be used as a sole basis for treatment. Nasal washings and aspirates are unacceptable for Xpert Xpress SARS-CoV-2/FLU/RSV testing.  Fact Sheet for Patients: EntrepreneurPulse.com.au  Fact Sheet for Healthcare Providers: IncredibleEmployment.be  This test is not yet approved or cleared by the [place] FDA and has been  authorized for detection and/or diagnosis of SARS-CoV-2 by FDA under an Emergency Use Authorization (EUA). This EUA will remain in effect (meaning this test can be used) for the duration of the COVID-19 declaration under Section 564(b)(1) of the Act, 21 U.S.C. section 360bbb-3(b)(1), unless the authorization is terminated or revoked.  Performed at [facility], [address] [place], [place] [address]   Culture, blood (routine x 2)     Status: None   Collection Time: 04/11/21 12:57 AM   Specimen: Left Antecubital; Blood  Result Value Ref Range Status   Specimen Description LEFT ANTECUBITAL  Final   Special Requests   Final    BOTTLES DRAWN AEROBIC AND ANAEROBIC Blood Culture adequate volume   Culture   Final    NO GROWTH 5 DAYS Performed at [facility], [address] [place], [place] [address]    Report Status 04/16/2021 FINAL  Final  Culture, blood (routine x 2)     Status: None   Collection Time: 04/11/21 12:57 AM   Specimen: Left  Antecubital; Blood  Result Value Ref Range Status   Specimen Description LEFT ANTECUBITAL  Final   Special Requests   Final    BOTTLES DRAWN AEROBIC AND ANAEROBIC Blood Culture adequate volume   Culture   Final    NO GROWTH 5 DAYS Performed at [facility], [address] [place], [place] [address]    Report Status 04/16/2021 FINAL  Final         Radiology Studies: No results found.      Scheduled Meds:  apixaban  5 mg Oral BID   feeding supplement  237 mL Oral TID BM   insulin aspart  0-9 Units Subcutaneous TID PC,HS,0200   lidocaine  1 patch Transdermal Q24H   metoprolol succinate  25 mg Oral BID   midodrine  5 mg Oral TID WC   multivitamin with minerals  1 tablet Oral Daily   polyethylene glycol  17 g Oral Daily   senna-docusate  2 tablet Oral BID   Continuous Infusions:  sodium chloride 50 mL/hr at 04/16/21 0600    Assessment & Plan:   Principal Problem:   Hypotension Active  Problems:   Type 2 diabetes mellitus with renal complication (HCC)   Atrial fibrillation (HCC)   Acute kidney injury superimposed on CKD ([place])   Hyponatremia   Metabolic acidosis   (HFpEF) heart failure with preserved ejection fraction ([place])   Pre-syncope secondary to hypotension - Likely due to hypotension/hypovolemia  6/27 sx improved with hydration.  Bp improving with ivf and midodrine      Severe AKI on CKD stage 3b -Creatinine significantly elevated up to 4.19 with BUN of 88 from a prior creatinine of 1.62.  Based on previous nephrology documentation appears that she has been having some progression of her renal disease.   -Suspect this is now acutely worsened due to dehydration with lack of p.o. intake and torsemide use.  She also endorses self adjusting her regimen of metformin and taking increased doses Nephrology following Renal ultrasound no acute abnormality of hydronephrosis Continue to hold torsemide and ACE inhibitor's 6/27- creatinine improving, 2.41 today. Avoid nephrotoxic meds Continue ivf gentle hydration Encouraged po intake  Hyponatremia Secondary to hypovolemia 6/27 resolved with ivf NA 135 today    Type 2 diabetes -Last Hb A1C in March at 7.3% 6/27-BG stable Continue RISS   Constipation Improving Continue bowel regimen    Atrial fibrillation -CHA2DS2-VASc score of 5 - Hold Xarelto and switched to Eliquis for now due to worsening renal function  6/26 heart rate variable, mildly on the tacky side continueToprol-XL to 25 mg twice daily Will like to increase beta blk more but bp on nml low side. Will increase midodrine to 10mg  tid so can increase beta blk   HFpEF -EF 60-65% on echo 11/2019.   no acute exacerbation.  Monitor closely as she is receiving IV fluids  6/26 decrease IV fluids to 50 mils per hour        Questionable pneumonia On chest x-ray.  Could be just from effusion and possible atelectasis.  Her procalcitonin is  negative  no leukocytosis.  Remains afebrile.  We will hold off on antibiotics Incentive spirometer    Metabolic acidosis -Bicarb of 17 likely due to worsening renal insufficency Improved with IV fluids         DVT prophylaxis: Eliquis Code Status: Full Family Communication: None at bedside Disposition Plan:  Status is: Inpatient  Remains inpatient appropriate because:Inpatient level of care appropriate due to severity of  illness  Dispo: The patient is from: Home              Anticipated d/c is to: Home              Patient currently is not medically stable to d/c.   Difficult to place patient No            LOS: 5 days   Time spent: 45 minutes with more than 50% on [place], MD [facility] Pager [phone]  If 7PM-7AM, please contact night-coverage 04/16/2021, 8:58 AM

## 2021-04-16 NOTE — Progress Notes (Signed)
[facility]  ROUNDING NOTE   Subjective:   [name] was admitted to [facility] on 04/10/2021 for Dizziness [R42] Hyponatremia [E87.1] SIRS (systemic inflammatory response syndrome) (HCC) [R65.10] AKI (acute kidney injury) ([place]) [N17.9] Hypotension [I95.9] Acute renal failure, unspecified acute renal failure type ([place]) [N17.9] Syncope, unspecified syncope type [R55] Community acquired pneumonia of left lower lobe of lung [J18.9]  Patient endorses a syncopal episode. She has not been eating well. Patient continued to take her torsemide and benezapril. She states her glucose readings have been at goal.   Patient seen sitting in chair Continues to have poor appetite Supplementing with Ensure Denies shortness of breath, nausea and vomiting    Objective:  Vital signs in last 24 hours:  Temp:  [97.7 F (36.5 C)-98.1 F (36.7 C)] 97.8 F (36.6 C) (06/27 1510) Pulse Rate:  [92-106] 95 (06/27 1510) Resp:  [16-18] 18 (06/27 1510) BP: (91-142)/(69-93) 108/75 (06/27 1510) SpO2:  [96 %-99 %] 99 % (06/27 1510)  Weight change:  Filed Weights   04/10/21 2246 04/12/21 0356 04/14/21 0200  Weight: 97.5 kg 94.1 kg 102.1 kg    Intake/Output: I/O last 3 completed shifts: In: 4214.4 [P.O.:515; I.V.:3699.4] Out: 2002 [Urine:2000; Stool:2]   Intake/Output this shift:  Total I/O In: 240 [P.O.:240] Out: -   Physical Exam: General: NAD, sitting in chair  Head: Normocephalic, atraumatic. Moist oral mucosal membranes  Eyes: Anicteric  Lungs:  Clear to auscultation  Heart: Regular rate and rhythm  Abdomen:  Soft, nontender, obese  Extremities:  trace peripheral edema.  Neurologic: Nonfocal, moving all four extremities  Skin: No lesions        Basic Metabolic Panel: Recent Labs  Lab 04/12/21 1041 04/13/21 0504 04/14/21 0657 04/15/21 1034 04/16/21 0547  NA 124* 127* 130* 134* 135  K 3.9 3.8 4.3 4.7 4.9  CL 87* 92* 95* 97* 101  CO2 23 24 23 27 24   GLUCOSE  180* 119* 148* 164* 143*  BUN 89* 88* 79* 64* 53*  CREATININE 4.98* 4.45* 3.74* 2.98* 2.41*  CALCIUM 8.4* 8.4* 8.7* 9.1 9.1  PHOS  --   --   --   --  2.8     Liver Function Tests: Recent Labs  Lab 04/16/21 0547  ALBUMIN 3.2*   No results for input(s): LIPASE, AMYLASE in the last 168 hours. No results for input(s): AMMONIA in the last 168 hours.  CBC: Recent Labs  Lab 04/10/21 2258 04/13/21 0504  WBC 10.1 8.1  HGB 14.8 11.7*  HCT 44.2 34.4*  MCV 85.2 85.4  PLT 412* 341     Cardiac Enzymes: No results for input(s): CKTOTAL, CKMB, CKMBINDEX, TROPONINI in the last 168 hours.  BNP: Invalid input(s): POCBNP  CBG: Recent Labs  Lab 04/15/21 0807 04/15/21 1136 04/15/21 1634 04/15/21 2035 04/16/21 1138  GLUCAP 132* 144* 187* 144* 161*     Microbiology: Results for orders placed or performed during the hospital encounter of 04/10/21  Resp Panel by RT-PCR (Flu A&B, Covid) Nasopharyngeal Swab     Status: None   Collection Time: 04/11/21 12:12 AM   Specimen: Nasopharyngeal Swab; Nasopharyngeal(NP) swabs in vial transport medium  Result Value Ref Range Status   SARS Coronavirus 2 by RT PCR NEGATIVE NEGATIVE Final    Comment: (NOTE) SARS-CoV-2 target nucleic acids are NOT DETECTED.  The SARS-CoV-2 RNA is generally detectable in upper respiratory specimens during the acute phase of infection. The lowest concentration of SARS-CoV-2 viral copies this assay can detect is 138  copies/mL. A negative result does not preclude SARS-Cov-2 infection and should not be used as the sole basis for treatment or other patient management decisions. A negative result may occur with  improper specimen collection/handling, submission of specimen other than nasopharyngeal swab, presence of viral mutation(s) within the areas targeted by this assay, and inadequate number of viral copies(<138 copies/mL). A negative result must be combined with clinical observations, patient history, and  epidemiological information. The expected result is Negative.  Fact Sheet for Patients:  EntrepreneurPulse.com.au  Fact Sheet for Healthcare Providers:  IncredibleEmployment.be  This test is no t yet approved or cleared by the [place] FDA and  has been authorized for detection and/or diagnosis of SARS-CoV-2 by FDA under an Emergency Use Authorization (EUA). This EUA will remain  in effect (meaning this test can be used) for the duration of the COVID-19 declaration under Section 564(b)(1) of the Act, 21 U.S.C.section 360bbb-3(b)(1), unless the authorization is terminated  or revoked sooner.       Influenza A by PCR NEGATIVE NEGATIVE Final   Influenza B by PCR NEGATIVE NEGATIVE Final    Comment: (NOTE) The Xpert Xpress SARS-CoV-2/FLU/RSV plus assay is intended as an aid in the diagnosis of influenza from Nasopharyngeal swab specimens and should not be used as a sole basis for treatment. Nasal washings and aspirates are unacceptable for Xpert Xpress SARS-CoV-2/FLU/RSV testing.  Fact Sheet for Patients: EntrepreneurPulse.com.au  Fact Sheet for Healthcare Providers: IncredibleEmployment.be  This test is not yet approved or cleared by the [place] FDA and has been authorized for detection and/or diagnosis of SARS-CoV-2 by FDA under an Emergency Use Authorization (EUA). This EUA will remain in effect (meaning this test can be used) for the duration of the COVID-19 declaration under Section 564(b)(1) of the Act, 21 U.S.C. section 360bbb-3(b)(1), unless the authorization is terminated or revoked.  Performed at [facility], [address] [place], [place] [address]   Culture, blood (routine x 2)     Status: None   Collection Time: 04/11/21 12:57 AM   Specimen: Left Antecubital; Blood  Result Value Ref Range Status   Specimen Description LEFT ANTECUBITAL  Final   Special Requests    Final    BOTTLES DRAWN AEROBIC AND ANAEROBIC Blood Culture adequate volume   Culture   Final    NO GROWTH 5 DAYS Performed at [facility], [address], [place], [place] [address]    Report Status 04/16/2021 FINAL  Final  Culture, blood (routine x 2)     Status: None   Collection Time: 04/11/21 12:57 AM   Specimen: Left Antecubital; Blood  Result Value Ref Range Status   Specimen Description LEFT ANTECUBITAL  Final   Special Requests   Final    BOTTLES DRAWN AEROBIC AND ANAEROBIC Blood Culture adequate volume   Culture   Final    NO GROWTH 5 DAYS Performed at [facility], [address] [place], [place] [address]    Report Status 04/16/2021 FINAL  Final    Coagulation Studies: No results for input(s): LABPROT, INR in the last 72 hours.  Urinalysis: No results for input(s): COLORURINE, LABSPEC, PHURINE, GLUCOSEU, HGBUR, BILIRUBINUR, KETONESUR, PROTEINUR, UROBILINOGEN, NITRITE, LEUKOCYTESUR in the last 72 hours.  Invalid input(s): APPERANCEUR     Imaging: No results found.   Medications:      apixaban  5 mg Oral BID   feeding supplement  237 mL Oral TID BM   insulin aspart  0-9 Units Subcutaneous TID PC,HS,0200  lidocaine  1 patch Transdermal Q24H   metoprolol succinate  25 mg Oral BID   midodrine  5 mg Oral TID WC   multivitamin with minerals  1 tablet Oral Daily   polyethylene glycol  17 g Oral Daily   senna-docusate  2 tablet Oral BID   sodium phosphate, traMADol  Assessment/ Plan:  [name] is a 75 y.o. white female with diabetes mellitus type II noninsulin dependent, hypertension, hyperlipidemia, atrial fibrillation, peripheral vascular disease who is admitted to [facility] on 04/10/2021 for Dizziness [R42] Hyponatremia [E87.1] SIRS (systemic inflammatory response syndrome) (HCC) [R65.10] AKI (acute kidney injury) ([place]) [N17.9] Hypotension [I95.9] Acute renal failure, unspecified acute renal failure type ([place])  [N17.9] Syncope, unspecified syncope type [R55] Community acquired pneumonia of left lower lobe of lung [J18.9]  Acute Kidney Injury on chronic kidney disease stage IIIB: with history of bland urine. Baseline creatinine of 1.62, GFR of 33 on 01/15/2021. Followed by [facility], Dr. Candiss Norse. Creatinine decreased to 2.4. UOP 1L. Will need nephrology follow up after discharge. Hyponatremia: baseline 139 on 03/16/21. Sodium improved to 967, d/c IVF Metabolic acidosis: gap acidosis 10.  Diabetes mellitus type II with renal manifestations. Hemoglobin A1c of 7.3% on 01/15/21.   History is consistent with prerenal azotemia. Ultrasound without obstruction. No IV contrast exposure.  - No indication for dialysis.  - Continue holding torsemide, benazepril and metformin.    LOS: 5 Berlinda Farve 6/27/20223:16 PM

## 2021-04-17 ENCOUNTER — Ambulatory Visit: Payer: Medicare PPO | Admitting: Family Medicine

## 2021-04-17 LAB — CBC
HCT: 40.5 % (ref 36.0–46.0)
Hemoglobin: 13.2 g/dL (ref 12.0–15.0)
MCH: 28.9 pg (ref 26.0–34.0)
MCHC: 32.6 g/dL (ref 30.0–36.0)
MCV: 88.8 fL (ref 80.0–100.0)
Platelets: 352 10*3/uL (ref 150–400)
RBC: 4.56 MIL/uL (ref 3.87–5.11)
RDW: 16.5 % — ABNORMAL HIGH (ref 11.5–15.5)
WBC: 6.7 10*3/uL (ref 4.0–10.5)
nRBC: 0 % (ref 0.0–0.2)

## 2021-04-17 LAB — BASIC METABOLIC PANEL
Anion gap: 9 (ref 5–15)
BUN: 48 mg/dL — ABNORMAL HIGH (ref 8–23)
CO2: 26 mmol/L (ref 22–32)
Calcium: 9.5 mg/dL (ref 8.9–10.3)
Chloride: 102 mmol/L (ref 98–111)
Creatinine, Ser: 2.23 mg/dL — ABNORMAL HIGH (ref 0.44–1.00)
GFR, Estimated: 22 mL/min — ABNORMAL LOW (ref >60)
Glucose, Bld: 126 mg/dL — ABNORMAL HIGH (ref 70–99)
Potassium: 4.9 mmol/L (ref 3.5–5.1)
Sodium: 137 mmol/L (ref 135–145)

## 2021-04-17 LAB — GLUCOSE, CAPILLARY
Glucose-Capillary: 113 mg/dL — ABNORMAL HIGH (ref 70–99)
Glucose-Capillary: 165 mg/dL — ABNORMAL HIGH (ref 70–99)
Glucose-Capillary: 136 mg/dL — ABNORMAL HIGH (ref 70–99)
Glucose-Capillary: 121 mg/dL — ABNORMAL HIGH (ref 70–99)

## 2021-04-17 MED ORDER — CEPHALEXIN 500 MG PO CAPS
500.0000 mg | ORAL_CAPSULE | Freq: Three times a day (TID) | ORAL | Status: DC
  Administered 2021-04-17 – 2021-04-18 (×3): 500 mg via ORAL
  Filled 2021-04-17 (×3): qty 1

## 2021-04-17 NOTE — Progress Notes (Signed)
PROGRESS NOTE    Karen Farley  DVV:[id number] DOB: 11-Oct-1946 DOA: 04/10/2021 PCP: Valerie Roys, DO    Brief Narrative:  Karen Farley is a 75 y.o. female with medical history significant for HFpEF (EF 60-65% on echo 11/2019), atrial fibrillation on Xarelto, chronic venous insufficiency, PAD, type 2 diabetes, CKD stage IIIb who presents with concerns of presyncopal episode.  Found with AKI on top of chronic kidney disease.  Hypotensive.   Week management:  Was started on IVF, renal function improved slowly.  IV fluid was discontinued on 6/27.  Encouraged p.o. intake.  If she is able to keep up with her p.o. her bp has been low, started her on midodrine.  Increase it as she is required her Toprol-XL for her A. fib as her heart rate was not well controlled.  She has to keep up with her p.o. intake prior to discharge. She now developed ? Celllulitis today  Consultants:  Nephrology  Procedures:   Antimicrobials:      Subjective: No acute shortness of breath, chest pain, dizziness Objective: Vitals:   04/16/21 2021 04/16/21 2100 04/17/21 0352 04/17/21 0753  BP: 115/85  106/71 (!) 136/91  Pulse: 82  93 83  Resp: 17  20   Temp: 98.5 F (36.9 C)  98.7 F (37.1 C) 97.6 F (36.4 C)  TempSrc:      SpO2: 97% 98% 100% 94%  Weight:      Height:        Intake/Output Summary (Last 24 hours) at 04/17/2021 0826 Last data filed at 04/16/2021 1930 Gross per 24 hour  Intake 470 ml  Output 240 ml  Net 230 ml   Filed Weights   04/10/21 2246 04/12/21 0356 04/14/21 0200  Weight: 97.5 kg 94.1 kg 102.1 kg    Examination: Sitting in chair, calm, NAD Clear to auscultation no wheezing Irregular S1-S2 no gallops soft benign positive bowel sounds Positive edema bilaterally,  right lateral leg with erythema and warm to touch Alert alert oriented x4 grossly intact     Data Reviewed: I have personally reviewed following labs and imaging studies  CBC: Recent Labs  Lab  04/10/21 2258 04/13/21 0504 04/17/21 0625  WBC 10.1 8.1 6.7  HGB 14.8 11.7* 13.2  HCT 44.2 34.4* 40.5  MCV 85.2 85.4 88.8  PLT 412* 341 626   Basic Metabolic Panel: Recent Labs  Lab 04/13/21 0504 04/14/21 0657 04/15/21 1034 04/16/21 0547 04/17/21 0625  NA 127* 130* 134* 135 137  K 3.8 4.3 4.7 4.9 4.9  CL 92* 95* 97* 101 102  CO2 24 23 27 24 26   GLUCOSE 119* 148* 164* 143* 126*  BUN 88* 79* 64* 53* 48*  CREATININE 4.45* 3.74* 2.98* 2.41* 2.23*  CALCIUM 8.4* 8.7* 9.1 9.1 9.5  PHOS  --   --   --  2.8  --    GFR: Estimated Creatinine Clearance: 24.4 mL/min (A) (by C-G formula based on SCr of 2.23 mg/dL (H)). Liver Function Tests: Recent Labs  Lab 04/16/21 0547  ALBUMIN 3.2*   No results for input(s): LIPASE, AMYLASE in the last 168 hours. No results for input(s): AMMONIA in the last 168 hours. Coagulation Profile: No results for input(s): INR, PROTIME in the last 168 hours. Cardiac Enzymes: No results for input(s): CKTOTAL, CKMB, CKMBINDEX, TROPONINI in the last 168 hours. BNP (last 3 results) No results for input(s): PROBNP in the last 8760 hours. HbA1C: No results for input(s): HGBA1C in the last 72 hours. CBG:  Recent Labs  Lab 04/15/21 2035 04/16/21 1138 04/16/21 1653 04/16/21 2105 04/17/21 0739  GLUCAP 144* 161* 123* 129* 113*   Lipid Profile: No results for input(s): CHOL, HDL, LDLCALC, TRIG, CHOLHDL, LDLDIRECT in the last 72 hours. Thyroid Function Tests: No results for input(s): TSH, T4TOTAL, FREET4, T3FREE, THYROIDAB in the last 72 hours.  Anemia Panel: No results for input(s): VITAMINB12, FOLATE, FERRITIN, TIBC, IRON, RETICCTPCT in the last 72 hours. Sepsis Labs: Recent Labs  Lab 04/11/21 0057 04/11/21 0210 04/11/21 0211  PROCALCITON  --  <0.10  --   LATICACIDVEN 2.4*  --  1.8    Recent Results (from the past 240 hour(s))  Resp Panel by RT-PCR (Flu A&B, Covid) Nasopharyngeal Swab     Status: None   Collection Time: 04/11/21 12:12 AM    Specimen: Nasopharyngeal Swab; Nasopharyngeal(NP) swabs in vial transport medium  Result Value Ref Range Status   SARS Coronavirus 2 by RT PCR NEGATIVE NEGATIVE Final    Comment: (NOTE) SARS-CoV-2 target nucleic acids are NOT DETECTED.  The SARS-CoV-2 RNA is generally detectable in upper respiratory specimens during the acute phase of infection. The lowest concentration of SARS-CoV-2 viral copies this assay can detect is 138 copies/mL. A negative result does not preclude SARS-Cov-2 infection and should not be used as the sole basis for treatment or other patient management decisions. A negative result may occur with  improper specimen collection/handling, submission of specimen other than nasopharyngeal swab, presence of viral mutation(s) within the areas targeted by this assay, and inadequate number of viral copies(<138 copies/mL). A negative result must be combined with clinical observations, patient history, and epidemiological information. The expected result is Negative.  Fact Sheet for Patients:  EntrepreneurPulse.com.au  Fact Sheet for Healthcare Providers:  IncredibleEmployment.be  This test is no t yet approved or cleared by the [place] FDA and  has been authorized for detection and/or diagnosis of SARS-CoV-2 by FDA under an Emergency Use Authorization (EUA). This EUA will remain  in effect (meaning this test can be used) for the duration of the COVID-19 declaration under Section 564(b)(1) of the Act, 21 U.S.C.section 360bbb-3(b)(1), unless the authorization is terminated  or revoked sooner.       Influenza A by PCR NEGATIVE NEGATIVE Final   Influenza B by PCR NEGATIVE NEGATIVE Final    Comment: (NOTE) The Xpert Xpress SARS-CoV-2/FLU/RSV plus assay is intended as an aid in the diagnosis of influenza from Nasopharyngeal swab specimens and should not be used as a sole basis for treatment. Nasal washings and aspirates are  unacceptable for Xpert Xpress SARS-CoV-2/FLU/RSV testing.  Fact Sheet for Patients: EntrepreneurPulse.com.au  Fact Sheet for Healthcare Providers: IncredibleEmployment.be  This test is not yet approved or cleared by the [place] FDA and has been authorized for detection and/or diagnosis of SARS-CoV-2 by FDA under an Emergency Use Authorization (EUA). This EUA will remain in effect (meaning this test can be used) for the duration of the COVID-19 declaration under Section 564(b)(1) of the Act, 21 U.S.C. section 360bbb-3(b)(1), unless the authorization is terminated or revoked.  Performed at [facility], [address] [place], [place] [address]   Culture, blood (routine x 2)     Status: None   Collection Time: 04/11/21 12:57 AM   Specimen: Left Antecubital; Blood  Result Value Ref Range Status   Specimen Description LEFT ANTECUBITAL  Final   Special Requests   Final    BOTTLES DRAWN AEROBIC AND ANAEROBIC Blood Culture adequate volume   Culture  Final    NO GROWTH 5 DAYS Performed at [facility], [address] [place], [place] [address]    Report Status 04/16/2021 FINAL  Final  Culture, blood (routine x 2)     Status: None   Collection Time: 04/11/21 12:57 AM   Specimen: Left Antecubital; Blood  Result Value Ref Range Status   Specimen Description LEFT ANTECUBITAL  Final   Special Requests   Final    BOTTLES DRAWN AEROBIC AND ANAEROBIC Blood Culture adequate volume   Culture   Final    NO GROWTH 5 DAYS Performed at [facility], [address], [place], [place] [address]    Report Status 04/16/2021 FINAL  Final         Radiology Studies: No results found.      Scheduled Meds:  apixaban  5 mg Oral BID   feeding supplement  237 mL Oral TID BM   insulin aspart  0-9 Units Subcutaneous TID PC,HS,0200   lidocaine  1 patch Transdermal Q24H   metoprolol succinate  25 mg Oral BID   midodrine   10 mg Oral TID WC   multivitamin with minerals  1 tablet Oral Daily   polyethylene glycol  17 g Oral Daily   senna-docusate  2 tablet Oral BID   Continuous Infusions:  sodium chloride      Assessment & Plan:   Principal Problem:   Hypotension Active Problems:   Type 2 diabetes mellitus with renal complication (HCC)   Atrial fibrillation (HCC)   Acute kidney injury superimposed on CKD ([place])   Hyponatremia   Metabolic acidosis   (HFpEF) heart failure with preserved ejection fraction (HCC)   Pre-syncope secondary to hypotension - Likely due to hypotension/hypovolemia  6/27 sx improved with hydration.  Bp improving with ivf and midodrine      Severe AKI on CKD stage 3b -Creatinine significantly elevated up to 4.19 with BUN of 88 from a prior creatinine of 1.62.  Based on previous nephrology documentation appears that she has been having some progression of her renal disease.   -Suspect this is now acutely worsened due to dehydration with lack of p.o. intake and torsemide use.  She also endorses self adjusting her regimen of metformin and taking increased doses Nephrology following Renal ultrasound no acute abnormality of hydronephrosis Continue to hold torsemide and ACE inhibitor's 6/28-renal function improving, creatinine 2.23. IV fluids discontinued as she is accumulating more fluid in her legs Avoid nephrotoxic meds Encourage p.o. intake    Hyponatremia 2/2 hypovolemia Improved with IV fluids Sodium today 137    Type 2 diabetes -Last Hb A1C in March at 7.3% BG stable Continue R-ISS  RLE leg cellulitis? Leg very red, warm to touch.  Will start keflex , monitor Keep legs elevated for edema  Constipation Improving Continue bowel regimen    Atrial fibrillation -CHA2DS2-VASc score of 5 - Hold Xarelto and switched to Eliquis for now due to worsening renal function  6/26 heart rate variable, mildly on the tacky side continueToprol-XL to 25 mg twice  daily Continue midodrine as bp on low side without it and need to use beta blk for hr control.    HFpEF -EF 60-65% on echo 11/2019.   no acute exacerbation.  Monitor closely as she is receiving IV fluids  Ivf dc/d.        Questionable pneumonia On chest x-ray.  Could be just from effusion and possible atelectasis.  Her procalcitonin is negative  no leukocytosis.  Remains  afebrile.  We will hold off on antibiotics Incentive spirometer    Metabolic acidosis -Bicarb of 17 likely due to worsening renal insufficency Improved with IV fluids         DVT prophylaxis: Eliquis Code Status: Full Family Communication: None at bedside Disposition Plan:  Status is: Inpatient  Remains inpatient appropriate because:Inpatient level of care appropriate due to severity of illness  Dispo: The patient is from: Home              Anticipated d/c is to: Home              Patient currently is not medically stable to d/c.   Difficult to place patient No    Possible dc in am if able to increase po intake, labs stable.        LOS: 6 days   Time spent: 35 minutes with more than 50% on [place], MD [facility] Pager [phone]  If 7PM-7AM, please contact night-coverage 04/17/2021, 8:26 AM

## 2021-04-17 NOTE — Progress Notes (Signed)
PT Cancellation Note  Patient Details Name: Karen Farley MRN: [id number] DOB: 1946-01-08   Cancelled Treatment:    Reason Eval/Treat Not Completed: Other (comment).  Chart reviewed.  Nurse cleared pt for therapy participation.  Pt sitting in recliner upon PT arrival; 2 visitors present.  Pt reports swelling to both feet causing pain with standing and pt declined ambulation and any therapy ex's.  Pt reports she has been walking to the bathroom though (nurse also reports this).  Will re-attempt PT session at a later date/time.  Leitha Bleak, PT 04/17/21, 4:56 PM

## 2021-04-17 NOTE — Progress Notes (Addendum)
[facility]  ROUNDING NOTE   Subjective:   [name] was admitted to [facility] on 04/10/2021 for Dizziness [R42] Hyponatremia [E87.1] SIRS (systemic inflammatory response syndrome) (HCC) [R65.10] AKI (acute kidney injury) ([place]) [N17.9] Hypotension [I95.9] Acute renal failure, unspecified acute renal failure type ([place]) [N17.9] Syncope, unspecified syncope type [R55] Community acquired pneumonia of left lower lobe of lung [J18.9]  Patient endorses a syncopal episode. She has not been eating well. Patient continued to take her torsemide and benezapril. She states her glucose readings have been at goal.   Patient sitting in chair States she feels better today Complains of soreness in legs and soles of feet  Objective:  Vital signs in last 24 hours:  Temp:  [97.6 F (36.4 C)-98.7 F (37.1 C)] 97.7 F (36.5 C) (06/28 1129) Pulse Rate:  [79-95] 79 (06/28 1129) Resp:  [17-20] 18 (06/28 1129) BP: (104-136)/(71-91) 104/83 (06/28 1129) SpO2:  [94 %-100 %] 100 % (06/28 1129)  Weight change:  Filed Weights   04/10/21 2246 04/12/21 0356 04/14/21 0200  Weight: 97.5 kg 94.1 kg 102.1 kg    Intake/Output: I/O last 3 completed shifts: In: 2163.9 [P.O.:470; I.V.:1693.9] Out: 840 [Urine:840]   Intake/Output this shift:  Total I/O In: -  Out: 500 [Urine:500]  Physical Exam: General: NAD, sitting in chair  Head: Normocephalic, atraumatic. Moist oral mucosal membranes  Eyes: Anicteric  Lungs:  Clear to auscultation  Heart: Regular rate and rhythm  Abdomen:  Soft, nontender, obese  Extremities:  trace peripheral edema.  Neurologic: Nonfocal, moving all four extremities  Skin: BLE redness        Basic Metabolic Panel: Recent Labs  Lab 04/13/21 0504 04/14/21 0657 04/15/21 1034 04/16/21 0547 04/17/21 0625  NA 127* 130* 134* 135 137  K 3.8 4.3 4.7 4.9 4.9  CL 92* 95* 97* 101 102  CO2 24 23 27 24 26   GLUCOSE 119* 148* 164* 143* 126*  BUN 88* 79* 64* 53*  48*  CREATININE 4.45* 3.74* 2.98* 2.41* 2.23*  CALCIUM 8.4* 8.7* 9.1 9.1 9.5  PHOS  --   --   --  2.8  --      Liver Function Tests: Recent Labs  Lab 04/16/21 0547  ALBUMIN 3.2*    No results for input(s): LIPASE, AMYLASE in the last 168 hours. No results for input(s): AMMONIA in the last 168 hours.  CBC: Recent Labs  Lab 04/10/21 2258 04/13/21 0504 04/17/21 0625  WBC 10.1 8.1 6.7  HGB 14.8 11.7* 13.2  HCT 44.2 34.4* 40.5  MCV 85.2 85.4 88.8  PLT 412* 341 352     Cardiac Enzymes: No results for input(s): CKTOTAL, CKMB, CKMBINDEX, TROPONINI in the last 168 hours.  BNP: Invalid input(s): POCBNP  CBG: Recent Labs  Lab 04/16/21 1138 04/16/21 1653 04/16/21 2105 04/17/21 0739 04/17/21 1124  GLUCAP 161* 123* 129* 113* 165*     Microbiology: Results for orders placed or performed during the hospital encounter of 04/10/21  Resp Panel by RT-PCR (Flu A&B, Covid) Nasopharyngeal Swab     Status: None   Collection Time: 04/11/21 12:12 AM   Specimen: Nasopharyngeal Swab; Nasopharyngeal(NP) swabs in vial transport medium  Result Value Ref Range Status   SARS Coronavirus 2 by RT PCR NEGATIVE NEGATIVE Final    Comment: (NOTE) SARS-CoV-2 target nucleic acids are NOT DETECTED.  The SARS-CoV-2 RNA is generally detectable in upper respiratory specimens during the acute phase of infection. The lowest concentration of SARS-CoV-2 viral copies this assay can  detect is 138 copies/mL. A negative result does not preclude SARS-Cov-2 infection and should not be used as the sole basis for treatment or other patient management decisions. A negative result may occur with  improper specimen collection/handling, submission of specimen other than nasopharyngeal swab, presence of viral mutation(s) within the areas targeted by this assay, and inadequate number of viral copies(<138 copies/mL). A negative result must be combined with clinical observations, patient history, and  epidemiological information. The expected result is Negative.  Fact Sheet for Patients:  EntrepreneurPulse.com.au  Fact Sheet for Healthcare Providers:  IncredibleEmployment.be  This test is no t yet approved or cleared by the [place] FDA and  has been authorized for detection and/or diagnosis of SARS-CoV-2 by FDA under an Emergency Use Authorization (EUA). This EUA will remain  in effect (meaning this test can be used) for the duration of the COVID-19 declaration under Section 564(b)(1) of the Act, 21 U.S.C.section 360bbb-3(b)(1), unless the authorization is terminated  or revoked sooner.       Influenza A by PCR NEGATIVE NEGATIVE Final   Influenza B by PCR NEGATIVE NEGATIVE Final    Comment: (NOTE) The Xpert Xpress SARS-CoV-2/FLU/RSV plus assay is intended as an aid in the diagnosis of influenza from Nasopharyngeal swab specimens and should not be used as a sole basis for treatment. Nasal washings and aspirates are unacceptable for Xpert Xpress SARS-CoV-2/FLU/RSV testing.  Fact Sheet for Patients: EntrepreneurPulse.com.au  Fact Sheet for Healthcare Providers: IncredibleEmployment.be  This test is not yet approved or cleared by the [place] FDA and has been authorized for detection and/or diagnosis of SARS-CoV-2 by FDA under an Emergency Use Authorization (EUA). This EUA will remain in effect (meaning this test can be used) for the duration of the COVID-19 declaration under Section 564(b)(1) of the Act, 21 U.S.C. section 360bbb-3(b)(1), unless the authorization is terminated or revoked.  Performed at [facility], [address] [place], [place] [address]   Culture, blood (routine x 2)     Status: None   Collection Time: 04/11/21 12:57 AM   Specimen: Left Antecubital; Blood  Result Value Ref Range Status   Specimen Description LEFT ANTECUBITAL  Final   Special Requests    Final    BOTTLES DRAWN AEROBIC AND ANAEROBIC Blood Culture adequate volume   Culture   Final    NO GROWTH 5 DAYS Performed at [facility], [address], [place], [place] [address]    Report Status 04/16/2021 FINAL  Final  Culture, blood (routine x 2)     Status: None   Collection Time: 04/11/21 12:57 AM   Specimen: Left Antecubital; Blood  Result Value Ref Range Status   Specimen Description LEFT ANTECUBITAL  Final   Special Requests   Final    BOTTLES DRAWN AEROBIC AND ANAEROBIC Blood Culture adequate volume   Culture   Final    NO GROWTH 5 DAYS Performed at [facility], [address] [place], [place] [address]    Report Status 04/16/2021 FINAL  Final    Coagulation Studies: No results for input(s): LABPROT, INR in the last 72 hours.  Urinalysis: No results for input(s): COLORURINE, LABSPEC, PHURINE, GLUCOSEU, HGBUR, BILIRUBINUR, KETONESUR, PROTEINUR, UROBILINOGEN, NITRITE, LEUKOCYTESUR in the last 72 hours.  Invalid input(s): APPERANCEUR     Imaging: No results found.   Medications:      apixaban  5 mg Oral BID   feeding supplement  237 mL Oral TID BM   insulin aspart  0-9 Units  Subcutaneous TID PC,HS,0200   lidocaine  1 patch Transdermal Q24H   metoprolol succinate  25 mg Oral BID   midodrine  10 mg Oral TID WC   multivitamin with minerals  1 tablet Oral Daily   polyethylene glycol  17 g Oral Daily   senna-docusate  2 tablet Oral BID   sodium phosphate, traMADol  Assessment/ Plan:  [name] is a 75 y.o. white female with diabetes mellitus type II noninsulin dependent, hypertension, hyperlipidemia, atrial fibrillation, peripheral vascular disease who is admitted to [facility] on 04/10/2021 for Dizziness [R42] Hyponatremia [E87.1] SIRS (systemic inflammatory response syndrome) (HCC) [R65.10] AKI (acute kidney injury) ([place]) [N17.9] Hypotension [I95.9] Acute renal failure, unspecified acute renal failure type ([place])  [N17.9] Syncope, unspecified syncope type [R55] Community acquired pneumonia of left lower lobe of lung [J18.9]  Acute Kidney Injury on chronic kidney disease stage IIIB: with history of bland urine. Baseline creatinine of 1.62, GFR of 33 on 01/15/2021. Followed by [facility], Dr. Candiss Norse. Creatinine decreased to 2.2. Will need nephrology follow up after discharge. Hyponatremia: baseline 139 on 03/16/21. Sodium improved to 753 Metabolic acidosis: gap acidosis 9.  Diabetes mellitus type II with renal manifestations. Hemoglobin A1c of 7.3% on 01/15/21.   History is consistent with prerenal azotemia. Ultrasound without obstruction. No IV contrast exposure.  - No indication for dialysis.  - Continue holding torsemide, benazepril and metformin.    LOS: 6 Dalanie Kisner 6/28/20221:16 PM

## 2021-04-18 DIAGNOSIS — L03119 Cellulitis of unspecified part of limb: Secondary | ICD-10-CM

## 2021-04-18 LAB — RENAL FUNCTION PANEL
Albumin: 3.3 g/dL — ABNORMAL LOW (ref 3.5–5.0)
Anion gap: 9 (ref 5–15)
BUN: 44 mg/dL — ABNORMAL HIGH (ref 8–23)
CO2: 29 mmol/L (ref 22–32)
Calcium: 9.9 mg/dL (ref 8.9–10.3)
Chloride: 100 mmol/L (ref 98–111)
Creatinine, Ser: 2.15 mg/dL — ABNORMAL HIGH (ref 0.44–1.00)
GFR, Estimated: 23 mL/min — ABNORMAL LOW (ref >60)
Glucose, Bld: 134 mg/dL — ABNORMAL HIGH (ref 70–99)
Phosphorus: 3.2 mg/dL (ref 2.5–4.6)
Potassium: 5.4 mmol/L — ABNORMAL HIGH (ref 3.5–5.1)
Sodium: 138 mmol/L (ref 135–145)

## 2021-04-18 LAB — GLUCOSE, CAPILLARY
Glucose-Capillary: 164 mg/dL — ABNORMAL HIGH (ref 70–99)
Glucose-Capillary: 130 mg/dL — ABNORMAL HIGH (ref 70–99)

## 2021-04-18 MED ORDER — CEPHALEXIN 500 MG PO CAPS: 500.0000 mg | ORAL_CAPSULE | Freq: Three times a day (TID) | ORAL | 0 refills | Status: AC

## 2021-04-18 MED ORDER — LIDOCAINE 5 % EX PTCH: 1.0000 | MEDICATED_PATCH | CUTANEOUS | 0 refills | Status: DC

## 2021-04-18 MED ORDER — APIXABAN 5 MG PO TABS: 5.0000 mg | ORAL_TABLET | Freq: Two times a day (BID) | ORAL | 3 refills | Status: DC

## 2021-04-18 MED ORDER — TORSEMIDE 20 MG PO TABS: 20.0000 mg | ORAL_TABLET | ORAL | 1 refills | Status: DC

## 2021-04-18 NOTE — Progress Notes (Signed)
[facility]  ROUNDING NOTE   Subjective:   [name] was admitted to [facility] on 04/10/2021 for Dizziness [R42] Hyponatremia [E87.1] SIRS (systemic inflammatory response syndrome) (HCC) [R65.10] AKI (acute kidney injury) ([place]) [N17.9] Hypotension [I95.9] Acute renal failure, unspecified acute renal failure type ([place]) [N17.9] Syncope, unspecified syncope type [R55] Community acquired pneumonia of left lower lobe of lung [J18.9]  Patient endorses a syncopal episode. She has not been eating well. Patient continued to take her torsemide and benezapril. She states her glucose readings have been at goal.   Patient sitting in chair Appears to feel much better today Continues to complain of leg swelling and soreness   Objective:  Vital signs in last 24 hours:  Temp:  [97.5 F (36.4 C)-97.9 F (36.6 C)] 97.9 F (36.6 C) (06/29 0727) Pulse Rate:  [82-100] 100 (06/29 0727) Resp:  [16-18] 17 (06/29 0727) BP: (109-128)/(88-104) 121/88 (06/29 0727) SpO2:  [92 %-100 %] 100 % (06/29 0727)  Weight change:  Filed Weights   04/10/21 2246 04/12/21 0356 04/14/21 0200  Weight: 97.5 kg 94.1 kg 102.1 kg    Intake/Output: I/O last 3 completed shifts: In: 230 [P.O.:230] Out: 840 [Urine:840]   Intake/Output this shift:  Total I/O In: 600 [P.O.:360; Other:240] Out: -   Physical Exam: General: NAD, sitting in chair  Head: Normocephalic, atraumatic. Moist oral mucosal membranes  Eyes: Anicteric  Lungs:  Clear to auscultation  Heart: Regular rate and rhythm  Abdomen:  Soft, nontender, obese  Extremities:  trace peripheral edema.  Neurologic: Nonfocal, moving all four extremities  Skin: BLE redness        Basic Metabolic Panel: Recent Labs  Lab 04/14/21 0657 04/15/21 1034 04/16/21 0547 04/17/21 0625 04/18/21 0630  NA 130* 134* 135 137 138  K 4.3 4.7 4.9 4.9 5.4*  CL 95* 97* 101 102 100  CO2 23 27 24 26 29   GLUCOSE 148* 164* 143* 126* 134*  BUN 79* 64*  53* 48* 44*  CREATININE 3.74* 2.98* 2.41* 2.23* 2.15*  CALCIUM 8.7* 9.1 9.1 9.5 9.9  PHOS  --   --  2.8  --  3.2     Liver Function Tests: Recent Labs  Lab 04/16/21 0547 04/18/21 0630  ALBUMIN 3.2* 3.3*    No results for input(s): LIPASE, AMYLASE in the last 168 hours. No results for input(s): AMMONIA in the last 168 hours.  CBC: Recent Labs  Lab 04/13/21 0504 04/17/21 0625  WBC 8.1 6.7  HGB 11.7* 13.2  HCT 34.4* 40.5  MCV 85.4 88.8  PLT 341 352     Cardiac Enzymes: No results for input(s): CKTOTAL, CKMB, CKMBINDEX, TROPONINI in the last 168 hours.  BNP: Invalid input(s): POCBNP  CBG: Recent Labs  Lab 04/17/21 1124 04/17/21 1626 04/17/21 2047 04/18/21 0243 04/18/21 0728  GLUCAP 165* 136* 121* 164* 130*     Microbiology: Results for orders placed or performed during the hospital encounter of 04/10/21  Resp Panel by RT-PCR (Flu A&B, Covid) Nasopharyngeal Swab     Status: None   Collection Time: 04/11/21 12:12 AM   Specimen: Nasopharyngeal Swab; Nasopharyngeal(NP) swabs in vial transport medium  Result Value Ref Range Status   SARS Coronavirus 2 by RT PCR NEGATIVE NEGATIVE Final    Comment: (NOTE) SARS-CoV-2 target nucleic acids are NOT DETECTED.  The SARS-CoV-2 RNA is generally detectable in upper respiratory specimens during the acute phase of infection. The lowest concentration of SARS-CoV-2 viral copies this assay can detect is 138 copies/mL. A  negative result does not preclude SARS-Cov-2 infection and should not be used as the sole basis for treatment or other patient management decisions. A negative result may occur with  improper specimen collection/handling, submission of specimen other than nasopharyngeal swab, presence of viral mutation(s) within the areas targeted by this assay, and inadequate number of viral copies(<138 copies/mL). A negative result must be combined with clinical observations, patient history, and  epidemiological information. The expected result is Negative.  Fact Sheet for Patients:  EntrepreneurPulse.com.au  Fact Sheet for Healthcare Providers:  IncredibleEmployment.be  This test is no t yet approved or cleared by the [place] FDA and  has been authorized for detection and/or diagnosis of SARS-CoV-2 by FDA under an Emergency Use Authorization (EUA). This EUA will remain  in effect (meaning this test can be used) for the duration of the COVID-19 declaration under Section 564(b)(1) of the Act, 21 U.S.C.section 360bbb-3(b)(1), unless the authorization is terminated  or revoked sooner.       Influenza A by PCR NEGATIVE NEGATIVE Final   Influenza B by PCR NEGATIVE NEGATIVE Final    Comment: (NOTE) The Xpert Xpress SARS-CoV-2/FLU/RSV plus assay is intended as an aid in the diagnosis of influenza from Nasopharyngeal swab specimens and should not be used as a sole basis for treatment. Nasal washings and aspirates are unacceptable for Xpert Xpress SARS-CoV-2/FLU/RSV testing.  Fact Sheet for Patients: EntrepreneurPulse.com.au  Fact Sheet for Healthcare Providers: IncredibleEmployment.be  This test is not yet approved or cleared by the [place] FDA and has been authorized for detection and/or diagnosis of SARS-CoV-2 by FDA under an Emergency Use Authorization (EUA). This EUA will remain in effect (meaning this test can be used) for the duration of the COVID-19 declaration under Section 564(b)(1) of the Act, 21 U.S.C. section 360bbb-3(b)(1), unless the authorization is terminated or revoked.  Performed at [facility], [address] [place], [place] [address]   Culture, blood (routine x 2)     Status: None   Collection Time: 04/11/21 12:57 AM   Specimen: Left Antecubital; Blood  Result Value Ref Range Status   Specimen Description LEFT ANTECUBITAL  Final   Special Requests    Final    BOTTLES DRAWN AEROBIC AND ANAEROBIC Blood Culture adequate volume   Culture   Final    NO GROWTH 5 DAYS Performed at [facility], [address] [place], [place] [address]    Report Status 04/16/2021 FINAL  Final  Culture, blood (routine x 2)     Status: None   Collection Time: 04/11/21 12:57 AM   Specimen: Left Antecubital; Blood  Result Value Ref Range Status   Specimen Description LEFT ANTECUBITAL  Final   Special Requests   Final    BOTTLES DRAWN AEROBIC AND ANAEROBIC Blood Culture adequate volume   Culture   Final    NO GROWTH 5 DAYS Performed at [facility], [address] [place],  [address]    Report Status 04/16/2021 FINAL  Final    Coagulation Studies: No results for input(s): LABPROT, INR in the last 72 hours.  Urinalysis: No results for input(s): COLORURINE, LABSPEC, PHURINE, GLUCOSEU, HGBUR, BILIRUBINUR, KETONESUR, PROTEINUR, UROBILINOGEN, NITRITE, LEUKOCYTESUR in the last 72 hours.  Invalid input(s): APPERANCEUR     Imaging: No results found.   Medications:      apixaban  5 mg Oral BID   cephALEXin  500 mg Oral Q8H   feeding supplement  237 mL Oral TID BM   insulin aspart  0-9 Units Subcutaneous TID PC,HS,0200   lidocaine  1 patch Transdermal Q24H   metoprolol succinate  25 mg Oral BID   midodrine  10 mg Oral TID WC   multivitamin with minerals  1 tablet Oral Daily   polyethylene glycol  17 g Oral Daily   senna-docusate  2 tablet Oral BID   sodium phosphate, traMADol  Assessment/ Plan:  [name] is a 75 y.o. white female with diabetes mellitus type II noninsulin dependent, hypertension, hyperlipidemia, atrial fibrillation, peripheral vascular disease who is admitted to [facility] on 04/10/2021 for Dizziness [R42] Hyponatremia [E87.1] SIRS (systemic inflammatory response syndrome) (HCC) [R65.10] AKI (acute kidney injury) ([place]) [N17.9] Hypotension [I95.9] Acute renal failure, unspecified acute renal  failure type ([place]) [N17.9] Syncope, unspecified syncope type [R55] Community acquired pneumonia of left lower lobe of lung [J18.9]  Acute Kidney Injury on chronic kidney disease stage IIIB: with history of bland urine. Baseline creatinine of 1.62, GFR of 33 on 01/15/2021. Followed by [facility], Dr. Candiss Norse. Creatinine remains 2.2. Will need nephrology follow up after discharge. Hyponatremia: baseline 139 on 03/16/21. Stable Metabolic acidosis: gap acidosis 9.  Diabetes mellitus type II with renal manifestations. Hemoglobin A1c of 7.3% on 01/15/21.   History is consistent with prerenal azotemia. Ultrasound without obstruction. No IV contrast exposure.  - BLE edema believed to be related to history of lymphedema. Patient encouraged to wear lymphedema pumps and compression hose at home.   LOS: [place] 6/29/20221:53 PM

## 2021-04-18 NOTE — Discharge Summary (Signed)
[place] [facility] [place] NAME: Karen Farley    MR#:  [id number]  DATE OF BIRTH:  [birth year]  DATE OF ADMISSION:  04/10/2021 ADMITTING PHYSICIAN: Orene Desanctis, DO  DATE OF DISCHARGE: 04/18/2021  PRIMARY CARE PHYSICIAN: Valerie Roys, DO    ADMISSION DIAGNOSIS:  Dizziness [R42] Hyponatremia [E87.1] SIRS (systemic inflammatory response syndrome) (HCC) [R65.10] AKI (acute kidney injury) ([place]) [N17.9] Hypotension [I95.9] Acute renal failure, unspecified acute renal failure type ([place]) [N17.9] Syncope, unspecified syncope type [R55] Community acquired pneumonia of left lower lobe of lung [J18.9]  DISCHARGE DIAGNOSIS:  acute on chronic kidney disease stage III B bilateral lower extremity lymphedema with  mild cellulitis  SECONDARY DIAGNOSIS:   Past Medical History:  Diagnosis Date  . 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 DDD (degenerative disc disease), lumbar   . Degenerative disc disease, lumbar    bulging and dengerated  . Diabetes mellitus without complication ([place])   . Hyperlipidemia   . Hypertension   . Osteoarthritis of both knees   . Osteopenia     HOSPITAL COURSE:   Karen Farley is a 75 y.o. female with medical history significant for HFpEF (EF 60-65% on echo 11/2019), atrial fibrillation on Xarelto, chronic venous insufficiency, PAD, type 2 diabetes, CKD stage IIIb who presents with concerns of presyncopal episode.  Found with AKI on top of chronic kidney disease.  Pre-syncope secondary to hypotension/poor por intake - Likely due to hypotension/hypovolemia --6/29 sx improved with hydration. Patient is eating much better. Overall clinically much improved. --Bp improving with ivf. Bp >749 systolic. D/c midodrine  AKI on CKD stage 3b -Creatinine significantly elevated up to 4.19 with BUN of 88 from a prior creatinine of 1.62.  Based on previous nephrology documentation appears that she has been having  some progression of her renal disease.   -Suspect this is now acutely worsened due to dehydration with lack of p.o. intake and torsemide use.  She also endorses self adjusting her regimen of metformin and taking increased doses -Nephrology following-- discussed with Dr. Candiss Norse. Resume torsemide 20 mg Monday Wednesday Friday. -Renal ultrasound no acute abnormality of hydronephrosis -Continue to hold ACE inhibitor's -- creatinine down to 2.1. Patient will follow-up with nephrology as outpatient with Dr. Candiss Norse.   Hyponatremia 2/2 hypovolemia Improved with IV fluids Sodium 138   Type 2 diabetes -Last Hb A1C in March at 7.3% BG stable Continue R-ISS -- I have discontinued patient's metformin. She will continue with diet control and monitor sugars at home. Patient will discuss with her primary care physician and follow-up and decide whether safe to resume metformin depending on her creatinine levels.    RLE leg cellulitis/bilateral lymphedema/edema Leg very red, warm to touch. Will start keflex x 7 days total Keep legs elevated for edema patient advised to use her lymphedema pump   Constipation Continue bowel regimen  Atrial fibrillation -CHA2DS2-VASc score of 5 - Hold Xarelto and switched to Eliquis for now due to worsening renal function  continueToprol-XL to 25 mg twice daily   HFpEF -EF 60-65% on echo 11/2019.  no acute exacerbation.               DVT prophylaxis: Eliquis Code Status: Full Family Communication: None at bedside Disposition Plan:  Status is: Inpatient   Remains inpatient appropriate because:Inpatient level of care appropriate due to severity of illness   Dispo: The patient is from: Home  Anticipated d/c is to: Home              Patient currently is  medically optimized and agreeable for d/c.              Difficult to place patient No   discharge plan discussed with patient. She will follow-up closely with her primary care physician and  nephrology as outpatient.   CONSULTS OBTAINED:    DRUG ALLERGIES:  No Known Allergies  DISCHARGE MEDICATIONS:   Allergies as of 04/18/2021   No Known Allergies      Medication List     STOP taking these medications    benazepril 20 MG tablet Commonly known as: LOTENSIN   metFORMIN 500 MG 24 hr tablet Commonly known as: GLUCOPHAGE-XR   Xarelto 20 MG Tabs tablet Generic drug: rivaroxaban       TAKE these medications    Accu-Chek Guide test strip Generic drug: glucose blood USE TO CHECK BLOOD SUGAR DAILY   apixaban 5 MG Tabs tablet Commonly known as: ELIQUIS Take 1 tablet (5 mg total) by mouth 2 (two) times daily.   cephALEXin 500 MG capsule Commonly known as: KEFLEX Take 1 capsule (500 mg total) by mouth every 8 (eight) hours for 6 days.   latanoprost 0.005 % ophthalmic solution Commonly known as: XALATAN Place 1 drop into both eyes at bedtime.   lidocaine 5 % Commonly known as: LIDODERM Place 1 patch onto the skin daily. Remove & Discard patch within 12 hours or as directed by MD   Medical Compression Stockings Misc by Does not apply route.   metoprolol succinate 50 MG 24 hr tablet Commonly known as: TOPROL-XL Take 1 tablet (50 mg total) by mouth daily. Take with or immediately following a meal.   multivitamin with minerals Tabs tablet Take 1 tablet by mouth daily.   torsemide 20 MG tablet Commonly known as: DEMADEX Take 1 tablet (20 mg total) by mouth 3 (three) times a week. What changed:  how much to take when to take this additional instructions   traMADol 50 MG tablet Commonly known as: ULTRAM Take 1-2 tablets (50-100 mg total) by mouth every 6 (six) hours as needed.   vitamin D3 50 MCG (2000 UT) Caps Take 1 tablet by mouth.        If you experience worsening of your admission symptoms, develop shortness of breath, life threatening emergency, suicidal or homicidal thoughts you must seek medical attention immediately by calling 911  or calling your MD immediately  if symptoms less severe.  You Must read complete instructions/literature along with all the possible adverse reactions/side effects for all the Medicines you take and that have been prescribed to you. Take any new Medicines after you have completely understood and accept all the possible adverse reactions/side effects.   Please note  You were cared for by a hospitalist during your hospital stay. If you have any questions about your discharge medications or the care you received while you were in the hospital after you are discharged, you can call the unit and asked to speak with the hospitalist on call if the hospitalist that took care of you is not available. Once you are discharged, your primary care physician will handle any further medical issues. Please note that NO REFILLS for any discharge medications will be authorized once you are discharged, as it is imperative that you return to your primary care physician (or establish a relationship with a primary care physician if you do not  have one) for your aftercare needs so that they can reassess your need for medications and monitor your lab values. Today   SUBJECTIVE   Doing well VITAL SIGNS:  Blood pressure 121/88, pulse 100, temperature 97.9 F (36.6 C), temperature source Oral, resp. rate 17, height 5\' 2"  (1.575 m), weight 102.1 kg, SpO2 100 %.  I/O:   Intake/Output Summary (Last 24 hours) at 04/18/2021 1032 Last data filed at 04/18/2021 1023 Gross per 24 hour  Intake 600 ml  Output 600 ml  Net 0 ml    PHYSICAL EXAMINATION:  GENERAL:  75 y.o.-year-old patient lying in the bed with no acute distress.  LUNGS: Normal breath sounds bilaterally, no wheezing, rales,rhonchi or crepitation. No use of accessory muscles of respiration.  CARDIOVASCULAR: S1, S2 normal. No murmurs, rubs, or gallops.  ABDOMEN: Soft, non-tender, non-distended. Bowel sounds present. No organomegaly or mass.  EXTREMITIES:chronic  pedal edema/lymphedema with cellulitis on tibial shin right>left NEUROLOGIC: grossly non focal PSYCHIATRIC: The patient is alert and oriented x 3.  SKIN: as above  DATA REVIEW:   CBC  Recent Labs  Lab 04/17/21 0625  WBC 6.7  HGB 13.2  HCT 40.5  PLT 352    Chemistries  Recent Labs  Lab 04/18/21 0630  NA 138  K 5.4*  CL 100  CO2 29  GLUCOSE 134*  BUN 44*  CREATININE 2.15*  CALCIUM 9.9    Microbiology Results   Recent Results (from the past 240 hour(s))  Resp Panel by RT-PCR (Flu A&B, Covid) Nasopharyngeal Swab     Status: None   Collection Time: 04/11/21 12:12 AM   Specimen: Nasopharyngeal Swab; Nasopharyngeal(NP) swabs in vial transport medium  Result Value Ref Range Status   SARS Coronavirus 2 by RT PCR NEGATIVE NEGATIVE Final    Comment: (NOTE) SARS-CoV-2 target nucleic acids are NOT DETECTED.  The SARS-CoV-2 RNA is generally detectable in upper respiratory specimens during the acute phase of infection. The lowest concentration of SARS-CoV-2 viral copies this assay can detect is 138 copies/mL. A negative result does not preclude SARS-Cov-2 infection and should not be used as the sole basis for treatment or other patient management decisions. A negative result may occur with  improper specimen collection/handling, submission of specimen other than nasopharyngeal swab, presence of viral mutation(s) within the areas targeted by this assay, and inadequate number of viral copies(<138 copies/mL). A negative result must be combined with clinical observations, patient history, and epidemiological information. The expected result is Negative.  Fact Sheet for Patients:  EntrepreneurPulse.com.au  Fact Sheet for Healthcare Providers:  IncredibleEmployment.be  This test is no t yet approved or cleared by the [place] FDA and  has been authorized for detection and/or diagnosis of SARS-CoV-2 by FDA under an Emergency Use  Authorization (EUA). This EUA will remain  in effect (meaning this test can be used) for the duration of the COVID-19 declaration under Section 564(b)(1) of the Act, 21 U.S.C.section 360bbb-3(b)(1), unless the authorization is terminated  or revoked sooner.       Influenza A by PCR NEGATIVE NEGATIVE Final   Influenza B by PCR NEGATIVE NEGATIVE Final    Comment: (NOTE) The Xpert Xpress SARS-CoV-2/FLU/RSV plus assay is intended as an aid in the diagnosis of influenza from Nasopharyngeal swab specimens and should not be used as a sole basis for treatment. Nasal washings and aspirates are unacceptable for Xpert Xpress SARS-CoV-2/FLU/RSV testing.  Fact Sheet for Patients: EntrepreneurPulse.com.au  Fact Sheet for Healthcare Providers: IncredibleEmployment.be  This test is not  yet approved or cleared by the [place] and has been authorized for detection and/or diagnosis of SARS-CoV-2 by FDA under an Emergency Use Authorization (EUA). This EUA will remain in effect (meaning this test can be used) for the duration of the COVID-19 declaration under Section 564(b)(1) of the Act, 21 U.S.C. section 360bbb-3(b)(1), unless the authorization is terminated or revoked.  Performed at [facility], [address] [place], [place] [address]   Culture, blood (routine x 2)     Status: None   Collection Time: 04/11/21 12:57 AM   Specimen: Left Antecubital; Blood  Result Value Ref Range Status   Specimen Description LEFT ANTECUBITAL  Final   Special Requests   Final    BOTTLES DRAWN AEROBIC AND ANAEROBIC Blood Culture adequate volume   Culture   Final    NO GROWTH 5 DAYS Performed at [facility], [address], [place], [place] [address]    Report Status 04/16/2021 FINAL  Final  Culture, blood (routine x 2)     Status: None   Collection Time: 04/11/21 12:57 AM   Specimen: Left Antecubital; Blood  Result Value Ref Range Status    Specimen Description LEFT ANTECUBITAL  Final   Special Requests   Final    BOTTLES DRAWN AEROBIC AND ANAEROBIC Blood Culture adequate volume   Culture   Final    NO GROWTH 5 DAYS Performed at [facility], [address] [place], [place] [address]    Report Status 04/16/2021 FINAL  Final    RADIOLOGY:  No results found.   CODE STATUS:     Code Status Orders  (From admission, onward)           Start     Ordered   04/11/21 0143  Full code  Continuous        04/11/21 0143           Code Status History     This patient has a current code status but no historical code status.        TOTAL TIME TAKING CARE OF THIS PATIENT: 40 minutes.    Fritzi Mandes M.D  [facility]    CC: Primary care physician; Valerie Roys, DO

## 2021-04-18 NOTE — Discharge Instructions (Signed)
Patient advised to use lymphedema pump as before keep log of sugars at home. Discussed oral treatment with your PCP on follow-up. Hold your metformin.

## 2021-04-18 NOTE — Progress Notes (Signed)
Patient discharge home as per discharge order, discharge instruction provided, IV removed , tele removed prior patient discharge

## 2021-04-18 NOTE — TOC Transition Note (Signed)
Transition of Care [facility]) - CM/SW Discharge Note   Patient Details  Name: Karen Farley MRN: [id number] Date of Birth: Jan 11, 1946  [facility] CM/SW Contact:  Alberteen Sam, LCSW Phone Number: 04/18/2021, 10:50 AM   Clinical Narrative:     Patient to discharge home today. Previously offered home health services, was unsure 3 days ago. CSW followed up with patient again today to inquire as to decision on recommended home health services, reports she declines at this time and states she has supportive family/husband and does not believe he needs home health. Reports she has equipment needs at home.   No discharge needs identified at this time. Family to transport home.   Final next level of care: Home/Self Care Barriers to Discharge: No Barriers Identified   Patient Goals and CMS Choice   CMS Medicare.gov Compare Post Acute Care list provided to:: Patient Choice offered to / list presented to : Patient  Discharge Placement                       Discharge Plan and Services                                     Social Determinants of Health (SDOH) Interventions     Readmission Risk Interventions No flowsheet data found.

## 2021-04-19 ENCOUNTER — Telehealth: Payer: Self-pay

## 2021-04-19 NOTE — Telephone Encounter (Signed)
Transition Care Management Unsuccessful Follow-up Telephone Call  Date of discharge and from where:  04/18/2021 [facility]  Attempts:  1st Attempt  Reason for unsuccessful TCM follow-up call:  Left voice message

## 2021-04-25 ENCOUNTER — Encounter: Payer: Self-pay | Admitting: Family Medicine

## 2021-04-25 ENCOUNTER — Ambulatory Visit: Payer: Medicare PPO | Admitting: Family Medicine

## 2021-04-25 ENCOUNTER — Other Ambulatory Visit: Payer: Self-pay

## 2021-04-25 VITALS — BP 127/84 | HR 104 | Temp 97.7°F

## 2021-04-25 DIAGNOSIS — N189 Chronic kidney disease, unspecified: Secondary | ICD-10-CM | POA: Diagnosis not present

## 2021-04-25 DIAGNOSIS — I480 Paroxysmal atrial fibrillation: Secondary | ICD-10-CM | POA: Diagnosis not present

## 2021-04-25 DIAGNOSIS — I872 Venous insufficiency (chronic) (peripheral): Secondary | ICD-10-CM | POA: Diagnosis not present

## 2021-04-25 DIAGNOSIS — N1832 Chronic kidney disease, stage 3b: Secondary | ICD-10-CM

## 2021-04-25 DIAGNOSIS — N179 Acute kidney failure, unspecified: Secondary | ICD-10-CM

## 2021-04-25 DIAGNOSIS — L03115 Cellulitis of right lower limb: Secondary | ICD-10-CM

## 2021-04-25 DIAGNOSIS — I890 Lymphedema, not elsewhere classified: Secondary | ICD-10-CM | POA: Diagnosis not present

## 2021-04-25 DIAGNOSIS — E1122 Type 2 diabetes mellitus with diabetic chronic kidney disease: Secondary | ICD-10-CM | POA: Diagnosis not present

## 2021-04-25 LAB — BAYER DCA HB A1C WAIVED: HB A1C (BAYER DCA - WAIVED): 6.3 % (ref <7.0)

## 2021-04-25 NOTE — Progress Notes (Signed)
BP 127/84   Pulse (!) 104   Temp 97.7 F (36.5 C)   SpO2 95%    Subjective:    Patient ID: Karen Farley, female    DOB: 04-15-1946, 75 y.o.   MRN: [id number]  HPI: Karen Farley is a 75 y.o. female  Chief Complaint  Patient presents with  . Hospitalization Follow-up    Patient went to hospital for syncope was told she was dehydrated. Patient is having swelling in both legs and feet, states there are blisters on her legs that are opening up.   Transition of Care Hospital Follow up.   Hospital/Facility: [facility] D/C Physician: Dr. Allena Katz D/C Date:  04/18/21  Records Requested: 04/25/21 Records Received: 04/25/21 Records Reviewed: 04/25/21  Diagnoses on Discharge: Dizziness [R42] Hyponatremia [E87.1] SIRS (systemic inflammatory response syndrome) (HCC) [R65.10] AKI (acute kidney injury) (HCC) [N17.9] Hypotension [I95.9] Acute renal failure, unspecified acute renal failure type (HCC) [N17.9] Syncope, unspecified syncope type [R55] Community acquired pneumonia of left lower lobe of lung [J18.9]  Date of interactive Contact within 48 hours of discharge: 04/19/21 Contact was through: phone  Date of 7 day or 14 day face-to-face visit:   04/25/21 within 7 days  Outpatient Encounter Medications as of 04/25/2021  Medication Sig  . ACCU-CHEK GUIDE test strip USE TO CHECK BLOOD SUGAR DAILY  . apixaban (ELIQUIS) 5 MG TABS tablet Take 1 tablet (5 mg total) by mouth 2 (two) times daily.  Marland Kitchen latanoprost (XALATAN) 0.005 % ophthalmic solution Place 1 drop into both eyes at bedtime.  . lidocaine (LIDODERM) 5 % Place 1 patch onto the skin daily. Remove & Discard patch within 12 hours or as directed by MD  . metoprolol succinate (TOPROL-XL) 50 MG 24 hr tablet Take 1 tablet (50 mg total) by mouth daily. Take with or immediately following a meal.  . Multiple Vitamin (MULTIVITAMIN WITH MINERALS) TABS tablet Take 1 tablet by mouth daily.  Marland Kitchen torsemide (DEMADEX) 20 MG tablet Take 1 tablet (20 mg  total) by mouth 3 (three) times a week.  . traMADol (ULTRAM) 50 MG tablet Take 1-2 tablets (50-100 mg total) by mouth every 6 (six) hours as needed.  . Vitamin D, Cholecalciferol, 50 MCG (2000 UT) CAPS Take 1 tablet by mouth.  Karen Farley Bandages & Supports (MEDICAL COMPRESSION STOCKINGS) MISC by Does not apply route.   No facility-administered encounter medications on file as of 04/25/2021.  Per Hospitalist: " HOSPITAL COURSE:    Karen Farley is a 75 y.o. female with medical history significant for HFpEF (EF 60-65% on echo 11/2019), atrial fibrillation on Xarelto, chronic venous insufficiency, PAD, type 2 diabetes, CKD stage IIIb who presents with concerns of presyncopal episode.  Found with AKI on top of chronic kidney disease.   Pre-syncope secondary to hypotension/poor por intake - Likely due to hypotension/hypovolemia --6/29 sx improved with hydration. Patient is eating much better. Overall clinically much improved. --Bp improving with ivf. Bp >120 systolic. D/c midodrine   AKI on CKD stage 3b -Creatinine significantly elevated up to 4.19 with BUN of 88 from a prior creatinine of 1.62.  Based on previous nephrology documentation appears that she has been having some progression of her renal disease.   -Suspect this is now acutely worsened due to dehydration with lack of p.o. intake and torsemide use.  She also endorses self adjusting her regimen of metformin and taking increased doses -Nephrology following-- discussed with Dr. Thedore Mins. Resume torsemide 20 mg Monday Wednesday Friday. -Renal ultrasound no acute  abnormality of hydronephrosis -Continue to hold ACE inhibitor's -- creatinine down to 2.1. Patient will follow-up with nephrology as outpatient with Dr. Candiss Norse.   Hyponatremia 2/2 hypovolemia Improved with IV fluids Sodium 138   Type 2 diabetes -Last Hb A1C in March at 7.3% BG stable Continue R-ISS -- I have discontinued patient's metformin. She will continue with diet  control and monitor sugars at home. Patient will discuss with her primary care physician and follow-up and decide whether safe to resume metformin depending on her creatinine levels.     RLE leg cellulitis/bilateral lymphedema/edema Leg very red, warm to touch. Will start keflex x 7 days total Keep legs elevated for edema patient advised to use her lymphedema pump   Constipation Continue bowel regimen   Atrial fibrillation -CHA2DS2-VASc score of 5 - Hold Xarelto and switched to Eliquis for now due to worsening renal function  continueToprol-XL to 25 mg twice daily   HFpEF -EF 60-65% on echo 11/2019.  no acute exacerbation.   "  Diagnostic Tests Reviewed: CLINICAL DATA:  Dizziness   EXAM: PORTABLE CHEST 1 VIEW   COMPARISON:  None   FINDINGS: Cardiac shadow is enlarged. Right lung is clear. Left lung demonstrates basilar opacity with small effusion. No bony abnormality is seen.   IMPRESSION: Left basilar infiltrate with associated small effusion.  CLINICAL DATA:  Mental status change.   EXAM: CT HEAD WITHOUT CONTRAST   TECHNIQUE: Contiguous axial images were obtained from the base of the skull through the vertex without intravenous contrast.   COMPARISON:  05/27/2009   FINDINGS: Brain: No evidence of acute infarction, hemorrhage, hydrocephalus, extra-axial collection or mass lesion/mass effect. There is patchy low-attenuation within the subcortical and periventricular white matter compatible with chronic microvascular disease. Prominence of the sulci and ventricles compatible with brain atrophy.   Vascular: No hyperdense vessel or unexpected calcification.   Skull: Normal. Negative for fracture or focal lesion.   Sinuses/Orbits: No acute finding.   Other: None   IMPRESSION: 1. No acute intracranial abnormalities. 2. Chronic small vessel ischemic change and brain atrophy.  CLINICAL DATA:  Acute kidney injury.   EXAM: RENAL / URINARY TRACT ULTRASOUND  COMPLETE   COMPARISON:  05/08/2020   FINDINGS: Right Kidney:   Renal measurements: 10.0 x 5.0 x 4.7 cm = volume: 123.2. mL. Echogenicity within normal limits. No mass or hydronephrosis visualized.   Left Kidney:   Renal measurements: 10.7 x 6.4 x 6.1 cm. = volume: 219.7 mL. Echogenicity within normal limits. No mass or hydronephrosis visualized.   Bladder:   Incompletely distended.  No focal abnormality.   Other:   Left pleural effusion.   IMPRESSION: 1. No acute abnormality. No hydronephrosis identified. 2. Left pleural effusion.  Disposition: Home  Consults: None  Discharge Instructions: Follow up here and with Dr. Candiss Norse  Disease/illness Education: Discussed today  Home Health/Community Services Discussions/Referrals: Done today  Establishment or re-establishment of referral orders for community resources: Done today  Discussion with other health care providers: none  Assessment and Support of treatment regimen adherence: Good  Appointments Coordinated with: patient   Education for self-management, independent living, and ADLs: Discussed today  Since getting out of the hospital, Karen Farley has been feeling really tired. She notes that she has not been able to really get up and do much. Today is the first time she has done anything to come here, and she notes that it was really exhausting to do either that. She notes that her legs have been swelling a lot.  She has been having weeping coming from the skin. She was doing a lot better when she left the hospital with her legs, but she can't get her lymphedema pumps on because of the weeping. She notes that she has been really tired. No SOB. No CP. Her legs have been feeling better, not red or hot any more. She has finished her antibiotics. No other concerns or complaints at this time.   Relevant past medical, surgical, family and social history reviewed and updated as indicated. Interim medical history since our last  visit reviewed. Allergies and medications reviewed and updated.  Review of Systems  Constitutional:  Positive for appetite change and fatigue. Negative for activity change, chills, diaphoresis, fever and unexpected weight change.  HENT: Negative.    Respiratory: Negative.    Cardiovascular:  Positive for leg swelling. Negative for chest pain and palpitations.  Gastrointestinal: Negative.   Musculoskeletal: Negative.   Neurological: Negative.   Psychiatric/Behavioral: Negative.     Per HPI unless specifically indicated above     Objective:    BP 127/84   Pulse (!) 104   Temp 97.7 F (36.5 C)   SpO2 95%   Wt Readings from Last 3 Encounters:  04/26/21 234 lb (106.1 kg)  04/14/21 225 lb (102.1 kg)  01/15/21 234 lb 12.8 oz (106.5 kg)    Physical Exam Vitals and nursing note reviewed.  Constitutional:      General: She is not in acute distress.    Appearance: Normal appearance. She is obese. She is not ill-appearing, toxic-appearing or diaphoretic.  HENT:     Head: Normocephalic and atraumatic.     Right Ear: External ear normal.     Left Ear: External ear normal.     Nose: Nose normal.     Mouth/Throat:     Mouth: Mucous membranes are moist.     Pharynx: Oropharynx is clear.  Eyes:     General: No scleral icterus.       Right eye: No discharge.        Left eye: No discharge.     Extraocular Movements: Extraocular movements intact.     Conjunctiva/sclera: Conjunctivae normal.     Pupils: Pupils are equal, round, and reactive to light.  Cardiovascular:     Rate and Rhythm: Normal rate and regular rhythm.     Pulses: Normal pulses.     Heart sounds: Normal heart sounds. No murmur heard.   No friction rub. No gallop.  Pulmonary:     Effort: Pulmonary effort is normal. No respiratory distress.     Breath sounds: Normal breath sounds. No stridor. No wheezing, rhonchi or rales.  Chest:     Chest wall: No tenderness.  Musculoskeletal:        General: Normal range of  motion.     Cervical back: Normal range of motion and neck supple.     Right lower leg: Edema present.     Left lower leg: Edema present.  Skin:    General: Skin is warm and dry.     Capillary Refill: Capillary refill takes less than 2 seconds.     Coloration: Skin is not jaundiced or pale.     Findings: No bruising, erythema, lesion or rash.     Comments: Fluid filled blisters on bilateral legs with weeping  Neurological:     General: No focal deficit present.     Mental Status: She is alert and oriented to person, place, and time. Mental status is  at baseline.  Psychiatric:        Mood and Affect: Mood normal.        Behavior: Behavior normal.        Thought Content: Thought content normal.        Judgment: Judgment normal.    Results for orders placed or performed in visit on 04/25/21  Bayer DCA Hb A1c Waived  Result Value Ref Range   HB A1C (BAYER DCA - WAIVED) 6.3 <7.0 %  CBC with Differential/Platelet  Result Value Ref Range   WBC 6.9 3.4 - 10.8 x10E3/uL   RBC 4.61 3.77 - 5.28 x10E6/uL   Hemoglobin 12.9 11.1 - 15.9 g/dL   Hematocrit 40.3 34.0 - 46.6 %   MCV 87 79 - 97 fL   MCH 28.0 26.6 - 33.0 pg   MCHC 32.0 31.5 - 35.7 g/dL   RDW 15.3 11.7 - 15.4 %   Platelets 315 150 - 450 x10E3/uL   Neutrophils 81 Not Estab. %   Lymphs 7 Not Estab. %   Monocytes 8 Not Estab. %   Eos 3 Not Estab. %   Basos 1 Not Estab. %   Neutrophils Absolute 5.6 1.4 - 7.0 x10E3/uL   Lymphocytes Absolute 0.5 (L) 0.7 - 3.1 x10E3/uL   Monocytes Absolute 0.5 0.1 - 0.9 x10E3/uL   EOS (ABSOLUTE) 0.2 0.0 - 0.4 x10E3/uL   Basophils Absolute 0.1 0.0 - 0.2 x10E3/uL   Immature Granulocytes 0 Not Estab. %   Immature Grans (Abs) 0.0 0.0 - 0.1 x10E3/uL  Comprehensive metabolic panel  Result Value Ref Range   Glucose 108 (H) 65 - 99 mg/dL   BUN 38 (H) 8 - 27 mg/dL   Creatinine, Ser 1.55 (H) 0.57 - 1.00 mg/dL   eGFR 35 (L) >59 mL/min/1.73   BUN/Creatinine Ratio 25 12 - 28   Sodium 136 134 - 144 mmol/L    Potassium 4.0 3.5 - 5.2 mmol/L   Chloride 96 96 - 106 mmol/L   CO2 23 20 - 29 mmol/L   Calcium 9.2 8.7 - 10.3 mg/dL   Total Protein 6.2 6.0 - 8.5 g/dL   Albumin 3.8 3.7 - 4.7 g/dL   Globulin, Total 2.4 1.5 - 4.5 g/dL   Albumin/Globulin Ratio 1.6 1.2 - 2.2   Bilirubin Total 0.7 0.0 - 1.2 mg/dL   Alkaline Phosphatase 89 44 - 121 IU/L   AST 18 0 - 40 IU/L   ALT 17 0 - 32 IU/L      Assessment & Plan:   Problem List Items Addressed This Visit       Cardiovascular and Mediastinum   Chronic venous insufficiency    Significant swelling- we will try to get her home health out to see if they can do unna boots to see if we can get her legs less swollen. Continue to monitor. Call with any concerns. Has a follow up with cardiology at the end of the week and with vascular coming up.        Atrial fibrillation ([place])    Tolerating her eliquis well. Will check CBC today. Continue to monitor. Has a follow up with cardiology later this week.        Relevant Orders   Ambulatory referral to [place]   CBC with Differential/Platelet (Completed)     Endocrine   Type 2 diabetes mellitus with renal complication (HCC)    Doing well with A1c of 6.3- we will have her remain off metformin given renal function and  will recheck A1c in 3 months. If stays good, we will remain off medication.        Relevant Orders   Ambulatory referral to [place] Hb A1c Waived (Completed)   CBC with Differential/Platelet (Completed)   Comprehensive metabolic panel (Completed)     Genitourinary   Acute kidney injury superimposed on CKD ([place]) - Primary    Rechecking labs today. Await results. Treat as needed. We will see about getting her into nephrology ASAP.        Relevant Orders   Ambulatory referral to [place]   CBC with Differential/Platelet (Completed)   Comprehensive metabolic panel (Completed)     Other   Lymphedema     Significant swelling- we will try to get her home  health out to see if they can do unna boots to see if we can get her legs less swollen. Continue to monitor. Call with any concerns. Has a follow up with cardiology at the end of the week and with vascular coming up.        Relevant Orders   Ambulatory referral to [place] with Differential/Platelet (Completed)   Cellulitis of lower extremity    Resolved. Continue to monitor.          Follow up plan: Return in about 4 weeks (around 05/23/2021).   >30 minutes spent with patient today

## 2021-04-26 ENCOUNTER — Encounter: Payer: Self-pay | Admitting: Nurse Practitioner

## 2021-04-26 ENCOUNTER — Ambulatory Visit: Payer: Medicare PPO | Admitting: Nurse Practitioner

## 2021-04-26 VITALS — BP 113/81 | HR 92 | Ht 64.0 in | Wt 234.0 lb

## 2021-04-26 DIAGNOSIS — I5033 Acute on chronic diastolic (congestive) heart failure: Secondary | ICD-10-CM

## 2021-04-26 DIAGNOSIS — I4821 Permanent atrial fibrillation: Secondary | ICD-10-CM

## 2021-04-26 DIAGNOSIS — I10 Essential (primary) hypertension: Secondary | ICD-10-CM | POA: Diagnosis not present

## 2021-04-26 DIAGNOSIS — N1832 Chronic kidney disease, stage 3b: Secondary | ICD-10-CM | POA: Diagnosis not present

## 2021-04-26 LAB — CBC WITH DIFFERENTIAL/PLATELET
Basophils Absolute: 0.1 10*3/uL (ref 0.0–0.2)
Basos: 1 %
EOS (ABSOLUTE): 0.2 10*3/uL (ref 0.0–0.4)
Eos: 3 %
Hematocrit: 40.3 % (ref 34.0–46.6)
Hemoglobin: 12.9 g/dL (ref 11.1–15.9)
Immature Grans (Abs): 0 10*3/uL (ref 0.0–0.1)
Immature Granulocytes: 0 %
Lymphocytes Absolute: 0.5 10*3/uL — ABNORMAL LOW (ref 0.7–3.1)
Lymphs: 7 %
MCH: 28 pg (ref 26.6–33.0)
MCHC: 32 g/dL (ref 31.5–35.7)
MCV: 87 fL (ref 79–97)
Monocytes Absolute: 0.5 10*3/uL (ref 0.1–0.9)
Monocytes: 8 %
Neutrophils Absolute: 5.6 10*3/uL (ref 1.4–7.0)
Neutrophils: 81 %
Platelets: 315 10*3/uL (ref 150–450)
RBC: 4.61 x10E6/uL (ref 3.77–5.28)
RDW: 15.3 % (ref 11.7–15.4)
WBC: 6.9 10*3/uL (ref 3.4–10.8)

## 2021-04-26 LAB — COMPREHENSIVE METABOLIC PANEL
ALT: 17 IU/L (ref 0–32)
AST: 18 IU/L (ref 0–40)
Albumin/Globulin Ratio: 1.6 (ref 1.2–2.2)
Albumin: 3.8 g/dL (ref 3.7–4.7)
Alkaline Phosphatase: 89 IU/L (ref 44–121)
BUN/Creatinine Ratio: 25 (ref 12–28)
BUN: 38 mg/dL — ABNORMAL HIGH (ref 8–27)
Bilirubin Total: 0.7 mg/dL (ref 0.0–1.2)
CO2: 23 mmol/L (ref 20–29)
Calcium: 9.2 mg/dL (ref 8.7–10.3)
Chloride: 96 mmol/L (ref 96–106)
Creatinine, Ser: 1.55 mg/dL — ABNORMAL HIGH (ref 0.57–1.00)
Globulin, Total: 2.4 g/dL (ref 1.5–4.5)
Glucose: 108 mg/dL — ABNORMAL HIGH (ref 65–99)
Potassium: 4 mmol/L (ref 3.5–5.2)
Sodium: 136 mmol/L (ref 134–144)
Total Protein: 6.2 g/dL (ref 6.0–8.5)
eGFR: 35 mL/min/{1.73_m2} — ABNORMAL LOW (ref >59)

## 2021-04-26 NOTE — Progress Notes (Signed)
Office Visit    Patient Name: Karen Farley Date of Encounter: 04/26/2021  Primary Care Provider:  Valerie Roys, DO Primary Cardiologist:  Kate Sable, MD  Chief Complaint    75 y/o ?  w/ a h/o PAF, HFpEF, CKD III ,HTN, and HL, who presents for follow-up related to paroxysmal atrial fibrillation.  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)    DDD (degenerative disc disease), lumbar    Degenerative disc disease, lumbar    bulging and dengerated   Diabetes mellitus without complication (HCC)    Hyperlipidemia    Hypertension    Osteoarthritis of both knees    Osteopenia    Past Surgical History:  Procedure Laterality Date   CHOLECYSTECTOMY     DILATION AND CURETTAGE OF UTERUS     TEAR DUCT PROBING WITH STRABISMUS REPAIR Right    TONSILLECTOMY      Allergies  No Known Allergies  History of Present Illness    75 year old female with the above past medical history including paroxysmal atrial fibrillation, HFpEF, stage III chronic kidney disease, hypertension, and hyperlipidemia.  She was initially diagnosed with atrial fibrillation in January 2021 and has been on beta-blocker and Xarelto therapy.  Prior echocardiogram in February 2021 showed normal LV function with grade 2 diastolic dysfunction.  She was last seen in cardiology clinic in March 2022 at which time she was stable on torsemide 20 mg daily.  Unfortunately, she was admitted to [facility] on June 21 with presyncope and hypotension as well as acute kidney injury with creatinine up to 4.98.  She was also diagnosed with right lower extremity cellulitis.  She required discontinuation of ACE inhibitor and holding of diuretics.  She was subsequently discharged home on torsemide 20 mg 3 times a week per nephrology.  Xarelto was changed to Eliquis due to worsening renal function during hospitalization.  Since discharge, she has  noticed an increase in lower extremity swelling, especially pedal edema.  Earlier this week she noted blistering and oozing of her lower extremities and was seen by primary care yesterday.  Her legs were wrapped.  She has noted mild dyspnea but also has not done very much since discharge.  In that setting, she has felt fatigued and intermittently lightheaded.  She has been avoiding salt and doing her best to keep her legs elevated.  She denies chest pain, palpitations, PND, syncope, or early satiety.  Home Medications    Prior to Admission medications   Medication Sig Start Date End Date Taking? Authorizing Provider  ACCU-CHEK GUIDE test strip USE TO CHECK BLOOD SUGAR DAILY 12/18/20  Yes Johnson, [name], DO  apixaban (ELIQUIS) 5 MG TABS tablet Take 1 tablet (5 mg total) by mouth 2 (two) times daily. 04/18/21  Yes Fritzi Mandes, MD  Elastic Bandages & Supports ([place]) [place] by Does not apply route.   Yes Johnson, [name], DO  latanoprost (XALATAN) 0.005 % ophthalmic solution Place 1 drop into both eyes at bedtime.   Yes [provider]  lidocaine (LIDODERM) 5 % Place 1 patch onto the skin daily. Remove & Discard patch within 12 hours or as directed by MD 04/18/21  Yes Fritzi Mandes, MD  metoprolol succinate (TOPROL-XL) 50 MG 24 hr tablet Take 1 tablet (50 mg total) by mouth daily. Take with or immediately following a meal. 10/09/20  Yes Johnson, [employer],  DO  Multiple Vitamin (MULTIVITAMIN WITH MINERALS) TABS tablet Take 1 tablet by mouth daily.   Yes [provider]  torsemide (DEMADEX) 20 MG tablet Take 1 tablet (20 mg total) by mouth 3 (three) times a week. 04/18/21  Yes Fritzi Mandes, MD  traMADol (ULTRAM) 50 MG tablet Take 1-2 tablets (50-100 mg total) by mouth every 6 (six) hours as needed. 01/10/20  Yes Johnson, [name], DO  Vitamin D, Cholecalciferol, 50 MCG (2000 UT) CAPS Take 1 tablet by mouth.   Yes [provider]    Review of Systems    Still  recovering from recent hospitalization as outlined above.  She has had some weakness and dyspnea but activity has been limited.  She denies chest pain, palpitations, PND, orthopnea, syncope, or early satiety.  All other systems reviewed and are otherwise negative except as noted above.  Physical Exam    VS:  BP 113/81 (BP Location: Left Arm, Patient Position: Sitting, Cuff Size: Large)   Pulse 92   Ht 5\' 4"  (1.626 m)   Wt 234 lb (106.1 kg)   SpO2 95%   BMI 40.17 kg/m  , BMI Body mass index is 40.17 kg/m. GEN: Obese, in no acute distress. HEENT: normal. Neck: Supple, no JVD, carotid bruits, or masses. Cardiac: Irregularly irregular, 2/6 systolic murmur loudest at the upper sternal borders, no rubs, or gallops. No clubbing, cyanosis.  She has 1+ bilateral lower extremity edema of her ankles and calves and 2+ pedal edema.    Radials 2+/PT 1+ and equal bilaterally.  Respiratory:  Respirations regular and unlabored, clear to auscultation bilaterally. GI: Obese, soft, nontender, nondistended, BS + x 4. MS: no deformity or atrophy. Skin: warm and dry, no rash. Neuro:  Strength and sensation are intact. Psych: Normal affect.  Accessory Clinical Findings    ECG personally reviewed by me today -atrial fibrillation, 92, low voltage, nonspecific T changes- no acute changes.  Lab Results  Component Value Date   WBC 6.9 04/25/2021   HGB 12.9 04/25/2021   HCT 40.3 04/25/2021   MCV 87 04/25/2021   PLT 315 04/25/2021   Lab Results  Component Value Date   CREATININE 1.55 (H) 04/25/2021   BUN 38 (H) 04/25/2021   NA 136 04/25/2021   K 4.0 04/25/2021   CL 96 04/25/2021   CO2 23 04/25/2021   Lab Results  Component Value Date   ALT 17 04/25/2021   AST 18 04/25/2021   ALKPHOS 89 04/25/2021   BILITOT 0.7 04/25/2021   Lab Results  Component Value Date   CHOL 210 (H) 01/15/2021   HDL 49 01/15/2021   LDLCALC 139 (H) 01/15/2021   TRIG 120 01/15/2021    Lab Results  Component Value Date    HGBA1C 6.3 04/25/2021    Assessment & Plan    1.  Permanent atrial fibrillation: Reasonably rate controlled on beta-blocker therapy.  She is now anticoagulated with Eliquis following recent hospitalization with acute renal failure prompting discontinuation of Xarelto.  2.  HFpEF: Though her weight is stable at 234 pounds compared to prior to hospitalization, she has noted some increase in lower extremity swelling with reduction in torsemide dose to 20 mg 3 times per week.  Lower extremity edema appreciated on exam today.  Creatinine yesterday was 1.55.  I advised her to take an additional 20 mg of torsemide this afternoon for a total of 40 mg daily but otherwise continue on 20 mg 3 times per week as outlined by nephrology  previously.  I encouraged her to follow-up with nephrology for additional diuretic dosing adjustments if appropriate.  Heart rate and blood pressure stable.  3.  Acute kidney injury/stage III chronic kidney disease: Recent hospitalization with hypotension, cellulitis, hypovolemia requiring discontinuation of ACE inhibitor and reduction in torsemide dosing.  Creatinine improved yesterday to 1.55 (up to 4.98 during hospitalization).  See #2.  Additional dose of torsemide today but otherwise dosing per nephrology.  4.  Essential hypertension: Stable.  5.  Bilateral lower extremity cellulitis: Status post hospitalization and antibiotic therapy.  She still has some redness to her lower extremities and in the setting of edema, had some weeping earlier this week.  Legs are currently wrapped.  Additional dose of torsemide today.  Encouraged her to keep her legs elevated and avoid excess salt/processed foods.  With healing of her lower extremities, she will plan to use compression in the future.  6.  Disposition: Follow-up in 6 to 8 weeks or sooner if necessary.  She plans to follow-up with nephrology within the next few weeks.   Murray Hodgkins, NP 04/26/2021, 12:52 PM

## 2021-04-26 NOTE — Patient Instructions (Signed)
Medication Instructions:  No changes at this time.  *If you need a refill on your cardiac medications before your next appointment, please call your pharmacy*   Lab Work: None  If you have labs (blood work) drawn today and your tests are completely normal, you will receive your results only by: [place] (if you have MyChart) OR A paper copy in the mail If you have any lab test that is abnormal or we need to change your treatment, we will call you to review the results.   Testing/Procedures: None   Follow-Up: At [facility], you and your health needs are our priority.  As part of our continuing mission to provide you with exceptional heart care, we have created designated Provider Care Teams.  These Care Teams include your primary Cardiologist (physician) and Advanced Practice Providers (APPs -  Physician Assistants and Nurse Practitioners) who all work together to provide you with the care you need, when you need it.  We recommend signing up for the patient portal called "MyChart".  Sign up information is provided on this After Visit Summary.  MyChart is used to connect with patients for Virtual Visits (Telemedicine).  Patients are able to view lab/test results, encounter notes, upcoming appointments, etc.  Non-urgent messages can be sent to your provider as well.   To learn more about what you can do with MyChart, go to [email or web address].    Your next appointment:   6-8 week(s)  The format for your next appointment:   In Person  Provider:   Kate Sable, MD or Murray Hodgkins, NP

## 2021-04-27 ENCOUNTER — Encounter: Payer: Self-pay | Admitting: Family Medicine

## 2021-04-28 ENCOUNTER — Encounter: Payer: Self-pay | Admitting: Family Medicine

## 2021-04-28 NOTE — Assessment & Plan Note (Signed)
Significant swelling- we will try to get her home health out to see if they can do unna boots to see if we can get her legs less swollen. Continue to monitor. Call with any concerns. Has a follow up with cardiology at the end of the week and with vascular coming up.

## 2021-04-28 NOTE — Assessment & Plan Note (Signed)
Tolerating her eliquis well. Will check CBC today. Continue to monitor. Has a follow up with cardiology later this week.

## 2021-04-28 NOTE — Assessment & Plan Note (Signed)
Doing well with A1c of 6.3- we will have her remain off metformin given renal function and will recheck A1c in 3 months. If stays good, we will remain off medication.

## 2021-04-28 NOTE — Assessment & Plan Note (Signed)
Rechecking labs today. Await results. Treat as needed. We will see about getting her into nephrology ASAP.

## 2021-04-28 NOTE — Assessment & Plan Note (Signed)
Lungs clear today. Significant swelling- we will try to get her home health out to see if they can do unna boots to see if we can get her legs less swollen. Continue to monitor. Call with any concerns. Has a follow up with cardiology at the end of the week and with vascular coming up.

## 2021-04-28 NOTE — Assessment & Plan Note (Signed)
Resolved.  Continue to monitor.

## 2021-04-30 ENCOUNTER — Other Ambulatory Visit: Payer: Self-pay | Admitting: Family Medicine

## 2021-05-03 DIAGNOSIS — I10 Essential (primary) hypertension: Secondary | ICD-10-CM | POA: Diagnosis not present

## 2021-05-03 DIAGNOSIS — E1122 Type 2 diabetes mellitus with diabetic chronic kidney disease: Secondary | ICD-10-CM | POA: Diagnosis not present

## 2021-05-03 DIAGNOSIS — I890 Lymphedema, not elsewhere classified: Secondary | ICD-10-CM | POA: Diagnosis not present

## 2021-05-03 DIAGNOSIS — N1832 Chronic kidney disease, stage 3b: Secondary | ICD-10-CM | POA: Diagnosis not present

## 2021-05-05 DIAGNOSIS — E1151 Type 2 diabetes mellitus with diabetic peripheral angiopathy without gangrene: Secondary | ICD-10-CM | POA: Diagnosis not present

## 2021-05-05 DIAGNOSIS — I4891 Unspecified atrial fibrillation: Secondary | ICD-10-CM | POA: Diagnosis not present

## 2021-05-05 DIAGNOSIS — I130 Hypertensive heart and chronic kidney disease with heart failure and stage 1 through stage 4 chronic kidney disease, or unspecified chronic kidney disease: Secondary | ICD-10-CM | POA: Diagnosis not present

## 2021-05-05 DIAGNOSIS — E1122 Type 2 diabetes mellitus with diabetic chronic kidney disease: Secondary | ICD-10-CM | POA: Diagnosis not present

## 2021-05-05 DIAGNOSIS — I872 Venous insufficiency (chronic) (peripheral): Secondary | ICD-10-CM | POA: Diagnosis not present

## 2021-05-05 DIAGNOSIS — I509 Heart failure, unspecified: Secondary | ICD-10-CM | POA: Diagnosis not present

## 2021-05-05 DIAGNOSIS — K5900 Constipation, unspecified: Secondary | ICD-10-CM | POA: Diagnosis not present

## 2021-05-05 DIAGNOSIS — I890 Lymphedema, not elsewhere classified: Secondary | ICD-10-CM | POA: Diagnosis not present

## 2021-05-05 DIAGNOSIS — N189 Chronic kidney disease, unspecified: Secondary | ICD-10-CM | POA: Diagnosis not present

## 2021-05-07 ENCOUNTER — Telehealth: Payer: Self-pay | Admitting: Family Medicine

## 2021-05-07 NOTE — Telephone Encounter (Signed)
Spoke with patient to follow up to see if she was back to using her pump per Dr.Johnson. Patient states she has not been using her pump lately, but says she will be restarting using my pump during therapy this week.  Left detailed message for [facility] providing OK for verbal orders for patient per Dr.Johnson. Advised to give our office a call back if she has any questions or concerns.

## 2021-05-07 NOTE — Telephone Encounter (Signed)
[place] for verbal orders. Saw cardiology recently and they were fine with her CHF- please make sure she is using her pump, she hadn't been because her legs were weeping

## 2021-05-07 NOTE — Telephone Encounter (Signed)
Home Health Verbal Orders - Caller/Agency: Tonya// [facility] [place] Number: [phone] [id number]  [id number] OT/PT/Skilled Nursing/Social Work/Speech Therapy: Nursing Frequency: 1wk 1, 1 every 2wk 6, 1PRN   She states that she is also needing to have ABI results before they can wrap her legs. Needs to know if pts congestive heart failure is controlled. The pt is also not using her lymphedema pump and this is leading to the pts swelling. Please advise.

## 2021-05-08 DIAGNOSIS — E1122 Type 2 diabetes mellitus with diabetic chronic kidney disease: Secondary | ICD-10-CM | POA: Diagnosis not present

## 2021-05-08 DIAGNOSIS — I890 Lymphedema, not elsewhere classified: Secondary | ICD-10-CM | POA: Diagnosis not present

## 2021-05-08 DIAGNOSIS — N189 Chronic kidney disease, unspecified: Secondary | ICD-10-CM | POA: Diagnosis not present

## 2021-05-08 DIAGNOSIS — I130 Hypertensive heart and chronic kidney disease with heart failure and stage 1 through stage 4 chronic kidney disease, or unspecified chronic kidney disease: Secondary | ICD-10-CM | POA: Diagnosis not present

## 2021-05-08 DIAGNOSIS — I4891 Unspecified atrial fibrillation: Secondary | ICD-10-CM | POA: Diagnosis not present

## 2021-05-08 DIAGNOSIS — I509 Heart failure, unspecified: Secondary | ICD-10-CM | POA: Diagnosis not present

## 2021-05-08 DIAGNOSIS — E1151 Type 2 diabetes mellitus with diabetic peripheral angiopathy without gangrene: Secondary | ICD-10-CM | POA: Diagnosis not present

## 2021-05-08 DIAGNOSIS — K5900 Constipation, unspecified: Secondary | ICD-10-CM | POA: Diagnosis not present

## 2021-05-08 DIAGNOSIS — I872 Venous insufficiency (chronic) (peripheral): Secondary | ICD-10-CM | POA: Diagnosis not present

## 2021-05-11 DIAGNOSIS — I890 Lymphedema, not elsewhere classified: Secondary | ICD-10-CM | POA: Diagnosis not present

## 2021-05-11 DIAGNOSIS — K5900 Constipation, unspecified: Secondary | ICD-10-CM | POA: Diagnosis not present

## 2021-05-11 DIAGNOSIS — E1151 Type 2 diabetes mellitus with diabetic peripheral angiopathy without gangrene: Secondary | ICD-10-CM | POA: Diagnosis not present

## 2021-05-11 DIAGNOSIS — I509 Heart failure, unspecified: Secondary | ICD-10-CM | POA: Diagnosis not present

## 2021-05-11 DIAGNOSIS — I130 Hypertensive heart and chronic kidney disease with heart failure and stage 1 through stage 4 chronic kidney disease, or unspecified chronic kidney disease: Secondary | ICD-10-CM | POA: Diagnosis not present

## 2021-05-11 DIAGNOSIS — E1122 Type 2 diabetes mellitus with diabetic chronic kidney disease: Secondary | ICD-10-CM | POA: Diagnosis not present

## 2021-05-11 DIAGNOSIS — N189 Chronic kidney disease, unspecified: Secondary | ICD-10-CM | POA: Diagnosis not present

## 2021-05-11 DIAGNOSIS — I872 Venous insufficiency (chronic) (peripheral): Secondary | ICD-10-CM | POA: Diagnosis not present

## 2021-05-11 DIAGNOSIS — I4891 Unspecified atrial fibrillation: Secondary | ICD-10-CM | POA: Diagnosis not present

## 2021-05-14 ENCOUNTER — Ambulatory Visit (INDEPENDENT_AMBULATORY_CARE_PROVIDER_SITE_OTHER): Payer: Medicare PPO | Admitting: Vascular Surgery

## 2021-05-16 ENCOUNTER — Emergency Department: Payer: Medicare PPO

## 2021-05-16 ENCOUNTER — Encounter: Payer: Self-pay | Admitting: *Deleted

## 2021-05-16 ENCOUNTER — Telehealth: Payer: Self-pay

## 2021-05-16 ENCOUNTER — Other Ambulatory Visit: Payer: Self-pay

## 2021-05-16 DIAGNOSIS — N1832 Chronic kidney disease, stage 3b: Secondary | ICD-10-CM | POA: Diagnosis present

## 2021-05-16 DIAGNOSIS — I4821 Permanent atrial fibrillation: Secondary | ICD-10-CM | POA: Diagnosis present

## 2021-05-16 DIAGNOSIS — I872 Venous insufficiency (chronic) (peripheral): Secondary | ICD-10-CM | POA: Diagnosis present

## 2021-05-16 DIAGNOSIS — I739 Peripheral vascular disease, unspecified: Secondary | ICD-10-CM | POA: Diagnosis not present

## 2021-05-16 DIAGNOSIS — Z8249 Family history of ischemic heart disease and other diseases of the circulatory system: Secondary | ICD-10-CM | POA: Diagnosis not present

## 2021-05-16 DIAGNOSIS — I480 Paroxysmal atrial fibrillation: Secondary | ICD-10-CM | POA: Diagnosis not present

## 2021-05-16 DIAGNOSIS — E876 Hypokalemia: Secondary | ICD-10-CM | POA: Diagnosis present

## 2021-05-16 DIAGNOSIS — J918 Pleural effusion in other conditions classified elsewhere: Secondary | ICD-10-CM | POA: Diagnosis present

## 2021-05-16 DIAGNOSIS — Z800 Family history of malignant neoplasm of digestive organs: Secondary | ICD-10-CM | POA: Diagnosis not present

## 2021-05-16 DIAGNOSIS — Z20822 Contact with and (suspected) exposure to covid-19: Secondary | ICD-10-CM | POA: Diagnosis present

## 2021-05-16 DIAGNOSIS — I5030 Unspecified diastolic (congestive) heart failure: Secondary | ICD-10-CM | POA: Diagnosis not present

## 2021-05-16 DIAGNOSIS — Z6841 Body Mass Index (BMI) 40.0 and over, adult: Secondary | ICD-10-CM | POA: Diagnosis not present

## 2021-05-16 DIAGNOSIS — E1122 Type 2 diabetes mellitus with diabetic chronic kidney disease: Secondary | ICD-10-CM | POA: Diagnosis not present

## 2021-05-16 DIAGNOSIS — E1022 Type 1 diabetes mellitus with diabetic chronic kidney disease: Secondary | ICD-10-CM | POA: Diagnosis present

## 2021-05-16 DIAGNOSIS — Z48813 Encounter for surgical aftercare following surgery on the respiratory system: Secondary | ICD-10-CM | POA: Diagnosis not present

## 2021-05-16 DIAGNOSIS — R0602 Shortness of breath: Secondary | ICD-10-CM | POA: Diagnosis not present

## 2021-05-16 DIAGNOSIS — R531 Weakness: Secondary | ICD-10-CM | POA: Diagnosis not present

## 2021-05-16 DIAGNOSIS — R609 Edema, unspecified: Secondary | ICD-10-CM | POA: Diagnosis not present

## 2021-05-16 DIAGNOSIS — R5381 Other malaise: Secondary | ICD-10-CM | POA: Diagnosis present

## 2021-05-16 DIAGNOSIS — E1159 Type 2 diabetes mellitus with other circulatory complications: Secondary | ICD-10-CM | POA: Diagnosis not present

## 2021-05-16 DIAGNOSIS — L03011 Cellulitis of right finger: Secondary | ICD-10-CM | POA: Diagnosis present

## 2021-05-16 DIAGNOSIS — Z79899 Other long term (current) drug therapy: Secondary | ICD-10-CM

## 2021-05-16 DIAGNOSIS — E1051 Type 1 diabetes mellitus with diabetic peripheral angiopathy without gangrene: Secondary | ICD-10-CM | POA: Diagnosis present

## 2021-05-16 DIAGNOSIS — Z7901 Long term (current) use of anticoagulants: Secondary | ICD-10-CM | POA: Diagnosis not present

## 2021-05-16 DIAGNOSIS — I4819 Other persistent atrial fibrillation: Secondary | ICD-10-CM | POA: Diagnosis not present

## 2021-05-16 DIAGNOSIS — N179 Acute kidney failure, unspecified: Secondary | ICD-10-CM | POA: Diagnosis present

## 2021-05-16 DIAGNOSIS — R059 Cough, unspecified: Secondary | ICD-10-CM | POA: Diagnosis not present

## 2021-05-16 DIAGNOSIS — I890 Lymphedema, not elsewhere classified: Secondary | ICD-10-CM | POA: Diagnosis present

## 2021-05-16 DIAGNOSIS — I5031 Acute diastolic (congestive) heart failure: Secondary | ICD-10-CM | POA: Diagnosis not present

## 2021-05-16 DIAGNOSIS — E8779 Other fluid overload: Secondary | ICD-10-CM | POA: Diagnosis not present

## 2021-05-16 DIAGNOSIS — I130 Hypertensive heart and chronic kidney disease with heart failure and stage 1 through stage 4 chronic kidney disease, or unspecified chronic kidney disease: Principal | ICD-10-CM | POA: Diagnosis present

## 2021-05-16 DIAGNOSIS — I5033 Acute on chronic diastolic (congestive) heart failure: Secondary | ICD-10-CM | POA: Diagnosis present

## 2021-05-16 DIAGNOSIS — Z803 Family history of malignant neoplasm of breast: Secondary | ICD-10-CM | POA: Diagnosis not present

## 2021-05-16 DIAGNOSIS — J90 Pleural effusion, not elsewhere classified: Secondary | ICD-10-CM | POA: Diagnosis not present

## 2021-05-16 DIAGNOSIS — I4891 Unspecified atrial fibrillation: Secondary | ICD-10-CM | POA: Diagnosis not present

## 2021-05-16 DIAGNOSIS — E1151 Type 2 diabetes mellitus with diabetic peripheral angiopathy without gangrene: Secondary | ICD-10-CM | POA: Diagnosis not present

## 2021-05-16 DIAGNOSIS — I129 Hypertensive chronic kidney disease with stage 1 through stage 4 chronic kidney disease, or unspecified chronic kidney disease: Secondary | ICD-10-CM | POA: Diagnosis not present

## 2021-05-16 DIAGNOSIS — E785 Hyperlipidemia, unspecified: Secondary | ICD-10-CM | POA: Diagnosis present

## 2021-05-16 DIAGNOSIS — I517 Cardiomegaly: Secondary | ICD-10-CM | POA: Diagnosis not present

## 2021-05-16 DIAGNOSIS — Z820 Family history of epilepsy and other diseases of the nervous system: Secondary | ICD-10-CM

## 2021-05-16 DIAGNOSIS — I509 Heart failure, unspecified: Secondary | ICD-10-CM | POA: Diagnosis not present

## 2021-05-16 DIAGNOSIS — E8770 Fluid overload, unspecified: Secondary | ICD-10-CM | POA: Diagnosis not present

## 2021-05-16 DIAGNOSIS — N189 Chronic kidney disease, unspecified: Secondary | ICD-10-CM | POA: Diagnosis not present

## 2021-05-16 DIAGNOSIS — R601 Generalized edema: Secondary | ICD-10-CM | POA: Diagnosis present

## 2021-05-16 DIAGNOSIS — E871 Hypo-osmolality and hyponatremia: Secondary | ICD-10-CM | POA: Diagnosis not present

## 2021-05-16 DIAGNOSIS — M7989 Other specified soft tissue disorders: Secondary | ICD-10-CM | POA: Diagnosis not present

## 2021-05-16 DIAGNOSIS — K5900 Constipation, unspecified: Secondary | ICD-10-CM | POA: Diagnosis not present

## 2021-05-16 LAB — CBC
HCT: 42.3 % (ref 36.0–46.0)
Hemoglobin: 13.4 g/dL (ref 12.0–15.0)
MCH: 28.3 pg (ref 26.0–34.0)
MCHC: 31.7 g/dL (ref 30.0–36.0)
MCV: 89.2 fL (ref 80.0–100.0)
Platelets: 310 10*3/uL (ref 150–400)
RBC: 4.74 MIL/uL (ref 3.87–5.11)
RDW: 16.3 % — ABNORMAL HIGH (ref 11.5–15.5)
WBC: 8.1 10*3/uL (ref 4.0–10.5)
nRBC: 0 % (ref 0.0–0.2)

## 2021-05-16 LAB — TROPONIN I (HIGH SENSITIVITY)
Troponin I (High Sensitivity): 12 ng/L (ref <18)
Troponin I (High Sensitivity): 13 ng/L (ref <18)

## 2021-05-16 LAB — BASIC METABOLIC PANEL
Anion gap: 13 (ref 5–15)
BUN: 31 mg/dL — ABNORMAL HIGH (ref 8–23)
CO2: 30 mmol/L (ref 22–32)
Calcium: 9.3 mg/dL (ref 8.9–10.3)
Chloride: 95 mmol/L — ABNORMAL LOW (ref 98–111)
Creatinine, Ser: 1.4 mg/dL — ABNORMAL HIGH (ref 0.44–1.00)
GFR, Estimated: 39 mL/min — ABNORMAL LOW (ref >60)
Glucose, Bld: 125 mg/dL — ABNORMAL HIGH (ref 70–99)
Potassium: 3.2 mmol/L — ABNORMAL LOW (ref 3.5–5.1)
Sodium: 138 mmol/L (ref 135–145)

## 2021-05-16 LAB — BRAIN NATRIURETIC PEPTIDE: B Natriuretic Peptide: 122.6 pg/mL — ABNORMAL HIGH (ref 0.0–100.0)

## 2021-05-16 NOTE — Telephone Encounter (Signed)
Copied from [place] [id number]. Topic: General - Other >> May 16, 2021 11:49 AM [name], Karen Farley wrote: Reason for CRM: Pt wanted to speak with Dr Wynetta Emery regarding her PT, requested Farley call back to further discuss what is going on.

## 2021-05-16 NOTE — Telephone Encounter (Signed)
Patient states she is having fluid build up in her lower abdomen the size of the football, patient describes it as hard to the touch. Patient states she is using compression stockings on her legs, they seem to be doing better. Patient also states she is feeling short of breath and has low urine output since the weekend. Patient states she has been keeping a log of her weight and is not gaining any weight.

## 2021-05-16 NOTE — Telephone Encounter (Signed)
Per Dr. Neomia Dear patient needs to go to ED. Patient agrees and states she will go to ED.

## 2021-05-16 NOTE — ED Triage Notes (Signed)
Pt has sob and swelling of legs and abdomen.  Sx began 3-4 days ago.  Pt was in hospital recently for similar sx.  No chest pain.  Pt alert  speech clear.

## 2021-05-17 ENCOUNTER — Observation Stay (HOSPITAL_BASED_OUTPATIENT_CLINIC_OR_DEPARTMENT_OTHER)
Admit: 2021-05-17 | Discharge: 2021-05-17 | Disposition: A | Discharge status: Home / Self Care | Payer: Medicare PPO | Attending: Internal Medicine | Admitting: Internal Medicine

## 2021-05-17 ENCOUNTER — Encounter: Payer: Self-pay | Admitting: Internal Medicine

## 2021-05-17 ENCOUNTER — Inpatient Hospital Stay
Admission: EM | Admit: 2021-05-17 | Discharge: 2021-05-27 | DRG: 291 | Disposition: A | Discharge status: Home Health | Payer: Medicare PPO | Attending: Internal Medicine | Admitting: Internal Medicine

## 2021-05-17 DIAGNOSIS — I739 Peripheral vascular disease, unspecified: Secondary | ICD-10-CM | POA: Diagnosis present

## 2021-05-17 DIAGNOSIS — R601 Generalized edema: Secondary | ICD-10-CM | POA: Diagnosis not present

## 2021-05-17 DIAGNOSIS — I480 Paroxysmal atrial fibrillation: Secondary | ICD-10-CM | POA: Diagnosis not present

## 2021-05-17 DIAGNOSIS — I5033 Acute on chronic diastolic (congestive) heart failure: Secondary | ICD-10-CM | POA: Diagnosis not present

## 2021-05-17 DIAGNOSIS — E119 Type 2 diabetes mellitus without complications: Secondary | ICD-10-CM

## 2021-05-17 DIAGNOSIS — I872 Venous insufficiency (chronic) (peripheral): Secondary | ICD-10-CM | POA: Diagnosis present

## 2021-05-17 DIAGNOSIS — I890 Lymphedema, not elsewhere classified: Secondary | ICD-10-CM

## 2021-05-17 DIAGNOSIS — Z7901 Long term (current) use of anticoagulants: Secondary | ICD-10-CM

## 2021-05-17 DIAGNOSIS — R609 Edema, unspecified: Secondary | ICD-10-CM

## 2021-05-17 DIAGNOSIS — E8770 Fluid overload, unspecified: Secondary | ICD-10-CM

## 2021-05-17 DIAGNOSIS — E8779 Other fluid overload: Secondary | ICD-10-CM

## 2021-05-17 DIAGNOSIS — I4891 Unspecified atrial fibrillation: Secondary | ICD-10-CM | POA: Diagnosis present

## 2021-05-17 DIAGNOSIS — J90 Pleural effusion, not elsewhere classified: Principal | ICD-10-CM

## 2021-05-17 DIAGNOSIS — N1832 Chronic kidney disease, stage 3b: Secondary | ICD-10-CM

## 2021-05-17 DIAGNOSIS — I5031 Acute diastolic (congestive) heart failure: Secondary | ICD-10-CM

## 2021-05-17 DIAGNOSIS — I4821 Permanent atrial fibrillation: Secondary | ICD-10-CM

## 2021-05-17 DIAGNOSIS — E1159 Type 2 diabetes mellitus with other circulatory complications: Secondary | ICD-10-CM

## 2021-05-17 DIAGNOSIS — E66813 Obesity, class 3: Secondary | ICD-10-CM

## 2021-05-17 HISTORY — DX: Morbid (severe) obesity due to excess calories: E66.01

## 2021-05-17 HISTORY — DX: Chronic diastolic (congestive) heart failure: I50.32

## 2021-05-17 HISTORY — DX: Lymphedema, not elsewhere classified: I89.0

## 2021-05-17 LAB — BASIC METABOLIC PANEL
Anion gap: 15 (ref 5–15)
BUN: 28 mg/dL — ABNORMAL HIGH (ref 8–23)
CO2: 27 mmol/L (ref 22–32)
Calcium: 8.5 mg/dL — ABNORMAL LOW (ref 8.9–10.3)
Chloride: 95 mmol/L — ABNORMAL LOW (ref 98–111)
Creatinine, Ser: 1.26 mg/dL — ABNORMAL HIGH (ref 0.44–1.00)
GFR, Estimated: 45 mL/min — ABNORMAL LOW (ref >60)
Glucose, Bld: 130 mg/dL — ABNORMAL HIGH (ref 70–99)
Potassium: 3.5 mmol/L (ref 3.5–5.1)
Sodium: 137 mmol/L (ref 135–145)

## 2021-05-17 LAB — GLUCOSE, CAPILLARY: Glucose-Capillary: 133 mg/dL — ABNORMAL HIGH (ref 70–99)

## 2021-05-17 LAB — CBG MONITORING, ED
Glucose-Capillary: 126 mg/dL — ABNORMAL HIGH (ref 70–99)
Glucose-Capillary: 157 mg/dL — ABNORMAL HIGH (ref 70–99)
Glucose-Capillary: 129 mg/dL — ABNORMAL HIGH (ref 70–99)

## 2021-05-17 LAB — RESP PANEL BY RT-PCR (FLU A&B, COVID) ARPGX2
Influenza A by PCR: NEGATIVE
Influenza B by PCR: NEGATIVE
SARS Coronavirus 2 by RT PCR: NEGATIVE

## 2021-05-17 LAB — ECHOCARDIOGRAM COMPLETE
AR max vel: 1.24 cm2
AV Area VTI: 1.26 cm2
AV Area mean vel: 1.24 cm2
AV Mean grad: 9.3 mmHg
AV Peak grad: 16.4 mmHg
Ao pk vel: 2.02 m/s
Area-P 1/2: 3.97 cm2
S' Lateral: 2.37 cm

## 2021-05-17 LAB — PROCALCITONIN: Procalcitonin: <0.1 ng/mL

## 2021-05-17 MED ORDER — FUROSEMIDE 10 MG/ML IJ SOLN: 40.0000 mg | Freq: Two times a day (BID) | INTRAMUSCULAR | Status: DC

## 2021-05-17 MED ORDER — FUROSEMIDE 10 MG/ML IJ SOLN
40.0000 mg | Freq: Once | INTRAMUSCULAR | Status: AC
  Administered 2021-05-17: 40 mg via INTRAVENOUS
  Filled 2021-05-17: qty 4

## 2021-05-17 MED ORDER — INSULIN ASPART 100 UNIT/ML IJ SOLN
0.0000 [IU] | Freq: Three times a day (TID) | INTRAMUSCULAR | Status: DC
  Administered 2021-05-17: 2 [IU] via SUBCUTANEOUS
  Administered 2021-05-17: 3 [IU] via SUBCUTANEOUS
  Administered 2021-05-17 – 2021-05-18 (×2): 2 [IU] via SUBCUTANEOUS
  Administered 2021-05-18 – 2021-05-20 (×6): 3 [IU] via SUBCUTANEOUS
  Administered 2021-05-20 – 2021-05-21 (×5): 2 [IU] via SUBCUTANEOUS
  Administered 2021-05-22 (×2): 3 [IU] via SUBCUTANEOUS
  Administered 2021-05-23: 2 [IU] via SUBCUTANEOUS
  Administered 2021-05-23: 5 [IU] via SUBCUTANEOUS
  Administered 2021-05-23 – 2021-05-24 (×2): 2 [IU] via SUBCUTANEOUS
  Administered 2021-05-24: 3 [IU] via SUBCUTANEOUS
  Administered 2021-05-25 (×2): 2 [IU] via SUBCUTANEOUS
  Administered 2021-05-25 – 2021-05-27 (×5): 3 [IU] via SUBCUTANEOUS
  Administered 2021-05-27: 2 [IU] via SUBCUTANEOUS
  Filled 2021-05-17 (×31): qty 1

## 2021-05-17 MED ORDER — APIXABAN 5 MG PO TABS
5.0000 mg | ORAL_TABLET | Freq: Two times a day (BID) | ORAL | Status: DC
  Administered 2021-05-17 – 2021-05-19 (×6): 5 mg via ORAL
  Filled 2021-05-17 (×6): qty 1

## 2021-05-17 MED ORDER — ACETAMINOPHEN 650 MG RE SUPP: 650.0000 mg | Freq: Four times a day (QID) | RECTAL | Status: DC | PRN

## 2021-05-17 MED ORDER — POTASSIUM CHLORIDE CRYS ER 20 MEQ PO TBCR
40.0000 meq | EXTENDED_RELEASE_TABLET | Freq: Once | ORAL | Status: AC
  Administered 2021-05-17: 40 meq via ORAL
  Filled 2021-05-17: qty 2

## 2021-05-17 MED ORDER — ONDANSETRON HCL 4 MG/2ML IJ SOLN: 4.0000 mg | Freq: Four times a day (QID) | INTRAMUSCULAR | Status: DC | PRN

## 2021-05-17 MED ORDER — INSULIN ASPART 100 UNIT/ML IJ SOLN: 0.0000 [IU] | Freq: Every day | INTRAMUSCULAR | Status: DC

## 2021-05-17 MED ORDER — ACETAMINOPHEN 325 MG PO TABS
650.0000 mg | ORAL_TABLET | Freq: Four times a day (QID) | ORAL | Status: DC | PRN
  Administered 2021-05-23 – 2021-05-25 (×3): 650 mg via ORAL
  Filled 2021-05-17 (×3): qty 2

## 2021-05-17 MED ORDER — METOPROLOL SUCCINATE ER 25 MG PO TB24
25.0000 mg | ORAL_TABLET | Freq: Every day | ORAL | Status: DC
  Administered 2021-05-17 – 2021-05-22 (×6): 25 mg via ORAL
  Filled 2021-05-17 (×6): qty 1

## 2021-05-17 MED ORDER — ONDANSETRON HCL 4 MG PO TABS: 4.0000 mg | ORAL_TABLET | Freq: Four times a day (QID) | ORAL | Status: DC | PRN

## 2021-05-17 MED ORDER — FUROSEMIDE 10 MG/ML IJ SOLN
40.0000 mg | Freq: Two times a day (BID) | INTRAMUSCULAR | Status: DC
  Administered 2021-05-17 – 2021-05-21 (×9): 40 mg via INTRAVENOUS
  Filled 2021-05-17 (×9): qty 4

## 2021-05-17 NOTE — Progress Notes (Signed)
Same-day rounding progress note  Please see Dr. Josefine Class dictated history and physical for further details.  Patient was seen and examined while in the ED.  She was waiting for floor bed.  Agree with Dr. Josefine Class impression and plan.  Patient was sitting on bedside commode even though not having to go bathroom sharing that posture and position is much more comfortable with all her weight gain and back pain  Acute on chronic diastolic CHF Appreciate cardiology input.  Echo showing EF greater than 55% Continue IV Lasix twice daily, strict I's and O's and daily weight Net IO Since Admission: -400 mL [05/17/21 2141]   Left sided pleural effusion Patient would like to avoid thoracentesis if possible which is a good possibility if she could get diuresis  Time spent: 20 minutes

## 2021-05-17 NOTE — Progress Notes (Signed)
*  PRELIMINARY RESULTS* Echocardiogram 2D Echocardiogram has been performed.  Sherrie Sport 05/17/2021, 9:54 AM

## 2021-05-17 NOTE — H&P (Signed)
History and Physical    Karen Farley GEX:[id number] DOB: [birth year] DOA: 05/17/2021  PCP: Valerie Roys, DO   Patient coming from: home  I have personally briefly reviewed patient's old medical records in [place]  Chief Complaint: fluid retention  HPI: Karen Farley is a 75 y.o. female with medical history significant for Diastolic CHF, permanent A. fib on Eliquis,  DM, CKD 3B, morbid obesity, hospitalized 6/21-6/29 with hypotension and AKI in part related to poor tolerance of diuretic regimen who presents to the ED with a complaint of increased lower extremity edema and lower abdominal swelling, poor exercise tolerance, dyspnea on exertion.  She had minimal improvement with outpatient up titration of torsemide by her primary cardiologist.  She denies cough or chest pain.  Denies abdominal pain or diarrhea.  ED course: On arrival afebrile, BP 135/104, pulse 86, respirations 27 with O2 sat 95% on room air Troponin 12-13, and BNP 122.  Creatinine 1.4, much improved from 3.74 a month prior.  Potassium 3.2.  CBC normal.  EKG, personally viewed and interpreted: A. fib at 85 with nonspecific ST-T wave changes  Imaging: Chest x-ray with worsening left basilar atelectasis and/or infiltrate with associated left-sided pleural effusion  Patient given a dose of Lasix 40 mg IV.  Hospitalist consulted for admission.  Review of Systems: As per HPI otherwise all other systems on review of systems negative.    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 She has never used smokeless tobacco. She reports previous alcohol use. She reports that she does not use drugs.  No Known Allergies  Family History  Problem Relation Age of Onset   Heart disease Mother        CHF   Osteoporosis Mother    Breast cancer Mother    Heart disease Father 28   Cancer Brother        Colon CA- 2002- Youngest brother   Parkinson's disease Brother        Younger Brother   Heart disease Brother        2 brothers   Cancer Brother    Breast cancer Maternal Grandmother       Prior to Admission medications   Medication Sig Start Date End Date Taking? Authorizing Provider  ACCU-CHEK GUIDE test strip USE TO CHECK BLOOD SUGAR DAILY 12/18/20   Park Liter P, DO  apixaban (ELIQUIS) 5 MG TABS tablet Take 1 tablet (5 mg total) by mouth 2 (two) times daily. 04/18/21   Fritzi Mandes, MD  Elastic Bandages & Supports ([place]) [place] by Does not apply route.    Johnson, [name], DO  latanoprost (XALATAN) 0.005 % ophthalmic solution Place 1 drop into both eyes at bedtime.    [provider]  lidocaine (LIDODERM) 5 % Place 1 patch onto the skin daily. Remove & Discard patch within  12 hours or as directed by MD 04/18/21   Fritzi Mandes, MD  metoprolol succinate (TOPROL-XL) 50 MG 24 hr tablet TAKE 1 TABLET(50 MG) BY MOUTH DAILY WITH OR IMMEDIATELY FOLLOWING A MEAL 04/30/21   Johnson, [name], DO  Multiple Vitamin (MULTIVITAMIN WITH MINERALS) TABS tablet Take 1 tablet by mouth daily.    [provider]  torsemide (DEMADEX) 20 MG tablet Take 1 tablet (20 mg total) by mouth 3 (three) times a week. 04/18/21   Fritzi Mandes, MD  traMADol (ULTRAM) 50 MG tablet Take 1-2 tablets (50-100 mg total) by mouth every 6 (six) hours as needed. 01/10/20   Park Liter P, DO  Vitamin D, Cholecalciferol, 50 MCG (2000 UT) CAPS Take 1 tablet by mouth.    [provider]     Physical Exam: Vitals:   05/16/21 1738 05/16/21 1742 05/16/21 2027  BP:   (!) 135/104  Pulse:  69 86  Resp:  20 20  Temp:  98.6 F (37 C)   TempSrc:  Oral   SpO2:  95% 99%  Weight: 103.4 kg    Height: 5\' 2"  (1.575 m)       Vitals:   05/16/21 1738 05/16/21 1742 05/16/21 2027  BP:   (!) 135/104  Pulse:  69 86  Resp:  20 20  Temp:  98.6 F (37 C)   TempSrc:  Oral   SpO2:  95% 99%  Weight: 103.4 kg    Height: 5\' 2"  (1.575 m)        Constitutional: Alert and oriented x 3 . Not in any apparent distress HEENT:      Head: Normocephalic and atraumatic.         Eyes: PERLA, EOMI, Conjunctivae are normal. Sclera is non-icteric.       Mouth/Throat: Mucous membranes are moist.       Neck: Supple with no signs of meningismus. Cardiovascular: Regular rate and rhythm. No murmurs, gallops, or rubs. 2+ symmetrical distal pulses are present . No JVD. 3+LE edema Respiratory: Respiratory effort increased .Lungs sounds diminished mostly on left. No wheezes, crackles, or rhonchi.  Gastrointestinal: Somewhat distended, firm, non tender, and non distended with positive bowel sounds.  Genitourinary: No CVA tenderness. Musculoskeletal: Nontender with normal range of motion in all extremities. No cyanosis, or erythema of extremities. Neurologic:  Face is symmetric. Moving all extremities. No gross focal neurologic deficits . Skin: Skin is warm, dry.  Redness on anterior aspect of shins and dorsum of feet Psychiatric: Mood and affect are normal    Labs on Admission: I have personally reviewed following labs and imaging studies  CBC: Recent Labs  Lab 05/16/21 1744  WBC 8.1  HGB 13.4  HCT 42.3  MCV 89.2  PLT 440   Basic Metabolic Panel: Recent Labs  Lab 05/16/21 1744  NA 138  K 3.2*  CL 95*  CO2 30  GLUCOSE 125*  BUN 31*  CREATININE 1.40*  CALCIUM 9.3   GFR: Estimated Creatinine Clearance: 39.1 mL/min (A) (by C-G formula based on SCr of 1.4 mg/dL (H)). Liver Function  Tests: No results for input(s): AST, ALT, ALKPHOS, BILITOT, PROT, ALBUMIN in the last 168 hours. No results for input(s): LIPASE, AMYLASE in the last 168 hours. No results for input(s): AMMONIA in the last 168 hours. Coagulation Profile: No results for input(s): INR, PROTIME in the last 168 hours. Cardiac Enzymes: No results for input(s): CKTOTAL, CKMB, CKMBINDEX, TROPONINI in the last 168 hours. BNP (last 3 results) No results for  input(s): PROBNP in the last 8760 hours. HbA1C: No results for input(s): HGBA1C in the last 72 hours. CBG: No results for input(s): GLUCAP in the last 168 hours. Lipid Profile: No results for input(s): CHOL, HDL, LDLCALC, TRIG, CHOLHDL, LDLDIRECT in the last 72 hours. Thyroid Function Tests: No results for input(s): TSH, T4TOTAL, FREET4, T3FREE, THYROIDAB in the last 72 hours. Anemia Panel: No results for input(s): VITAMINB12, FOLATE, FERRITIN, TIBC, IRON, RETICCTPCT in the last 72 hours. Urine analysis:    Component Value Date/Time   COLORURINE YELLOW (A) 04/11/2021 1215   APPEARANCEUR HAZY (A) 04/11/2021 1215   APPEARANCEUR Clear 06/06/2020 1339   LABSPEC 1.011 04/11/2021 1215   PHURINE 5.0 04/11/2021 1215   GLUCOSEU NEGATIVE 04/11/2021 1215   HGBUR NEGATIVE 04/11/2021 [place] 04/11/2021 1215   BILIRUBINUR Negative 06/06/2020 1339   KETONESUR NEGATIVE 04/11/2021 1215   PROTEINUR NEGATIVE 04/11/2021 1215   NITRITE NEGATIVE 04/11/2021 1215   LEUKOCYTESUR MODERATE (A) 04/11/2021 1215    Radiological Exams on Admission: DG Chest 2 View  Result Date: 05/16/2021 CLINICAL DATA:  Shortness of breath and lower extremity swelling. EXAM: CHEST - 2 VIEW COMPARISON:  April 10, 2021 FINDINGS: There is opacification of the mid and lower left lung. This is increased in severity when compared to the prior study. A left pleural effusion is also noted. The right lung is clear. No pneumothorax is seen. The cardiac silhouette is enlarged and  unchanged in size. The visualized skeletal structures are unremarkable. IMPRESSION: 1. Worsening left basilar atelectasis and/or infiltrate with associated left-sided pleural effusion. Electronically Signed   By: Virgina Norfolk M.D.   On: 05/16/2021 19:06     Assessment/Plan 75 year old female with history of diastolic CHF, permanent A. fib on Eliquis, DM, CKD 3B, morbid obesity, hospitalized 6/21-6/29 with hypotension and AKI in part related to poor tolerance of diuretic regimen presenting with increasing swelling to lower extremities and abdomen and spite of outpatient up titration of torsemide by her primary cardiologist.      An[place]arc[place]   Acute on chronic diastolic CHF  - Patient with fluid retention not responding to outpatient increase in torsemide, - History of intolerance to diuretic intensification: presyncope/hypotension with AKI - CXR with left pleural effusion, size not specified - IV Lasix with close BP monitoring and monitoring of renal function - Daily weights with intake and output monitoring - We will get echo as most recent is from 2021 - Cardiology consult   Atrial fibrillation ([place])   Chronic anticoagulation - Continue Toprol as it BP will tolerate - Continue Eliquis(was recently switched from Xarelto due to worsening renal function)    Type 2 diabetes mellitus (HCC) - Sliding scale insulin coverage    Stage 3b chronic kidney disease ([place]) - Monitor renal function in view of recent AKI to creatinine over 4 - Consider nephrology consult if worsening    Obesity, Class III, BMI 40-49.9 (morbid obesity) ([place]) - Complicating factor to overall prognosis and care    DVT prophylaxis: Eliquis  code Status: full code  Family Communication:  none  Disposition Plan: Back to previous home environment Consults called: cardiology  Status:observation    [name] MD [facility]     05/17/2021, 2:29 AM

## 2021-05-17 NOTE — ED Notes (Signed)
Assisted patient to bathroom, no other complaints

## 2021-05-17 NOTE — ED Provider Notes (Signed)
[facility] Emergency Department Provider Note  ____________________________________________   Event Date/Time   First MD Initiated Contact with Patient 05/17/21 0140     (approximate)  I have reviewed the triage vital signs and the nursing notes.   HISTORY  Chief Complaint Shortness of Breath    HPI Karen Farley is a 75 y.o. female history of hypertension, diabetes, hyperlipidemia, chronic kidney disease, obesity who presents to the emergency department with swelling of the abdomen, shortness of breath, dry cough.  Was just admitted to the hospital about a month ago for hypotension thought secondary to hypovolemia and received IV fluids.  She states after this admission she developed significant swelling in both of her legs and weeping.  She states that the swelling has improved but she is now noticing swelling in her abdomen and started feeling short of breath.  She is on Lasix 20 mg 3 times a week.  No chest pain or chest discomfort.  She reports a dry nonproductive cough.  No fevers or chills.  She feels like she has had decreased urine output over the past day.        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)    DDD (degenerative disc disease), lumbar    Degenerative disc disease, lumbar    bulging and dengerated   Diabetes mellitus without complication ([place])    Hyperlipidemia    Hypertension    Osteoarthritis of both knees    Osteopenia     Patient Active Problem List   Diagnosis Date Noted   Stage 3b chronic kidney disease ([place]) 05/17/2021   Pleural effusion, left 05/17/2021   Acute on chronic diastolic CHF (congestive heart failure) ([place]) 05/17/2021   Volume overload 05/17/2021   Obesity, Class III, BMI 40-49.9 (morbid obesity) ([place]) 05/17/2021   Chronic anticoagulation 05/17/2021   Cellulitis of lower extremity    Hypotension 04/11/2021   Acute kidney injury superimposed on CKD  ([place]) 04/11/2021   Hyponatremia 51/70/0174   Metabolic acidosis 94/49/6759   (HFpEF) heart failure with preserved ejection fraction ([place]) 04/11/2021   Chronic venous insufficiency 08/14/2020   PAD (peripheral artery disease) ([place]) 08/14/2020   Bilateral primary osteoarthritis of knee 04/13/2020   Type 2 diabetes mellitus ([place]) 04/13/2020   Acquired spondylolisthesis 12/17/2019   Osteoarthritis of knee 12/17/2019   Lumbar radiculopathy 12/17/2019   Atrial fibrillation ([place]) 11/20/2019   Closed fracture of radial styloid 08/06/2019   Displaced fracture of unspecified radial styloid process, initial encounter for closed fracture 08/06/2019   Controlled substance agreement signed 01/24/2018   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   Piriformis syndrome 07/01/2016   Sacroiliac joint dysfunction 01/09/2016   Joint disorder, unspecified 01/09/2016   Low back pain 01/09/2016   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 diabetes mellitus ([place]) 04/28/2015   Lymphedema 03/27/2015   Edema 03/27/2015   Open-angle glaucoma, mild stage    Osteoarthritis of both knees    Type 2 diabetes mellitus with renal complication (HCC)    Osteopenia    Benign hypertensive renal disease    Hyperlipidemia    Degeneration of lumbosacral intervertebral disc     Past Surgical History:  Procedure Laterality Date   CHOLECYSTECTOMY     DILATION AND CURETTAGE OF UTERUS     TEAR DUCT PROBING WITH STRABISMUS REPAIR Right    TONSILLECTOMY      Prior to Admission medications   Medication  Sig Start Date End Date Taking? Authorizing Provider  ACCU-CHEK GUIDE test strip USE TO CHECK BLOOD SUGAR DAILY 12/18/20   Park Liter P, DO  apixaban (ELIQUIS) 5 MG TABS tablet Take 1 tablet (5 mg total) by mouth 2 (two) times daily. 04/18/21   Fritzi Mandes, MD  Elastic Bandages & Supports ([place]) [place] by Does not apply route.    Johnson, [name], DO  latanoprost (XALATAN) 0.005 % ophthalmic  solution Place 1 drop into both eyes at bedtime.    [provider]  lidocaine (LIDODERM) 5 % Place 1 patch onto the skin daily. Remove & Discard patch within 12 hours or as directed by MD 04/18/21   Fritzi Mandes, MD  metoprolol succinate (TOPROL-XL) 50 MG 24 hr tablet TAKE 1 TABLET(50 MG) BY MOUTH DAILY WITH OR IMMEDIATELY FOLLOWING A MEAL 04/30/21   Johnson, [name], DO  Multiple Vitamin (MULTIVITAMIN WITH MINERALS) TABS tablet Take 1 tablet by mouth daily.    [provider]  torsemide (DEMADEX) 20 MG tablet Take 1 tablet (20 mg total) by mouth 3 (three) times a week. 04/18/21   Fritzi Mandes, MD  traMADol (ULTRAM) 50 MG tablet Take 1-2 tablets (50-100 mg total) by mouth every 6 (six) hours as needed. 01/10/20   Park Liter P, DO  Vitamin D, Cholecalciferol, 50 MCG (2000 UT) CAPS Take 1 tablet by mouth.    [provider]    Allergies Patient has no known allergies.  Family History  Problem Relation Age of Onset   Heart disease Mother        CHF   Osteoporosis Mother    Breast cancer Mother    Heart disease Father 35   Cancer Brother        Colon CA- 2002- Youngest brother   Parkinson's disease Brother        Younger Brother   Heart disease Brother        2 brothers   Cancer Brother    Breast cancer Maternal Grandmother     Social History Social History   Tobacco Use   Smoking status: Never   Smokeless tobacco: Never  Vaping Use   Vaping Use: Never used  Substance Use Topics   Alcohol use: Not Currently    Alcohol/week: 0.0 standard drinks    Comment: on rare occasion   Drug use: No    Review of Systems Constitutional: No fever. Eyes: No visual changes. ENT: No sore throat. Cardiovascular: Denies chest pain. Respiratory: +shortness of breath. Gastrointestinal: No nausea, vomiting, diarrhea. Genitourinary: Negative for dysuria. Musculoskeletal: Negative for back pain. Skin: Negative for rash. Neurological: Negative for focal weakness or  numbness.  ____________________________________________   PHYSICAL EXAM:  VITAL SIGNS: ED Triage Vitals  Enc Vitals Group     BP 05/16/21 2027 (!) 135/104     Pulse Rate 05/16/21 1742 69     Resp 05/16/21 1742 20     Temp 05/16/21 1742 98.6 F (37 C)     Temp Source 05/16/21 1742 Oral     SpO2 05/16/21 1742 95 %     Weight 05/16/21 1738 228 lb (103.4 kg)     Height 05/16/21 1738 5\' 2"  (1.575 m)     Head Circumference --      Peak Flow --      Pain Score 05/16/21 1737 0     Pain Loc --      Pain Edu? --      Excl. in [place]? --  CONSTITUTIONAL: Alert and oriented and responds appropriately to questions.  Obese, in no distress HEAD: Normocephalic EYES: Conjunctivae clear, pupils appear equal, EOM appear intact ENT: normal nose; moist mucous membranes NECK: Supple, normal ROM CARD: Irregularly irregular and rate controlled; S1 and S2 appreciated; no murmurs, no clicks, no rubs, no gallops RESP: Normal chest excursion without splinting or tachypnea; breath sounds clear but diminished on the left, no rhonchi or wheezing or rales, no hypoxia ABD/GI: Normal bowel sounds; non-distended; soft, non-tender, no rebound, no guarding, no peritoneal signs, no hepatosplenomegaly BACK: The back appears normal EXT: Normal ROM in all joints; no deformity noted, patient has 3+ pitting edema in bilateral lower extremities going into the lower abdomen, she does have some redness to the anterior shins but no warmth SKIN: Normal color for age and race; warm; no rash on exposed skin NEURO: Moves all extremities equally PSYCH: The patient's mood and manner are appropriate.  ____________________________________________   LABS (all labs ordered are listed, but only abnormal results are displayed)  Labs Reviewed  BASIC METABOLIC PANEL - Abnormal; Notable for the following components:      Result Value   Potassium 3.2 (*)    Chloride 95 (*)    Glucose, Bld 125 (*)    BUN 31 (*)    Creatinine, Ser  1.40 (*)    GFR, Estimated 39 (*)    All other components within normal limits  CBC - Abnormal; Notable for the following components:   RDW 16.3 (*)    All other components within normal limits  BRAIN NATRIURETIC PEPTIDE - Abnormal; Notable for the following components:   B Natriuretic Peptide 122.6 (*)    All other components within normal limits  RESP PANEL BY RT-PCR (FLU A&B, COVID) ARPGX2  PROCALCITONIN  TROPONIN I (HIGH SENSITIVITY)  TROPONIN I (HIGH SENSITIVITY)   ____________________________________________  EKG   EKG Interpretation  Date/Time:  Wednesday May 16 2021 17:47:24 EDT Ventricular Rate:  85 PR Interval:    QRS Duration: 68 QT Interval:  338 QTC Calculation: 402 R Axis:   26 Text Interpretation: Atrial fibrillation Low voltage QRS Nonspecific T wave abnormality Abnormal ECG Confirmed by Pryor Curia [phone]) on 05/17/2021 2:22:44 AM        ____________________________________________  RADIOLOGY Jessie Foot Anjel Perfetti, personally viewed and evaluated these images (plain radiographs) as part of my medical decision making, as well as reviewing the written report by the radiologist.  ED MD interpretation: Large left pleural effusion.  Official radiology report(s): DG Chest 2 View  Result Date: 05/16/2021 CLINICAL DATA:  Shortness of breath and lower extremity swelling. EXAM: CHEST - 2 VIEW COMPARISON:  April 10, 2021 FINDINGS: There is opacification of the mid and lower left lung. This is increased in severity when compared to the prior study. A left pleural effusion is also noted. The right lung is clear. No pneumothorax is seen. The cardiac silhouette is enlarged and unchanged in size. The visualized skeletal structures are unremarkable. IMPRESSION: 1. Worsening left basilar atelectasis and/or infiltrate with associated left-sided pleural effusion. Electronically Signed   By: Virgina Norfolk M.D.   On: 05/16/2021 19:06     ____________________________________________   PROCEDURES  Procedure(s) performed (including Critical Care):  Procedures    ____________________________________________   INITIAL IMPRESSION / ASSESSMENT AND PLAN / ED COURSE  As part of my medical decision making, I reviewed the following data within the electronic MEDICAL RECORD NUMBER Notes from prior ED visits and [place] Controlled Substance Database, labs and imaging  and EKG reviewed, discussed with hospitalist for admission         Patient here with increasing swelling in the lower abdomen as well as shortness of breath.  No chest pain or chest discomfort.  EKG shows no new ischemic change.  BNP mildly elevated but chest x-ray does show a large left-sided pleural effusion.  She denies previous history of thoracentesis.  States she was recently treated for pneumonia.  There is concern for atelectasis versus infiltrate in the left lower lobe however she has not having any fevers, chills, productive cough.  She is afebrile here with a normal white blood cell count.  We will add on a procalcitonin and test for COVID and influenza prior to starting antibiotics.  She does appear volume overloaded today.  We will give IV Lasix.  Her husband states he is concerned because she has been over diuresed before causing AKI and hypotension.  We did discuss that this will be a delicate balance to get her euvolemic.  I feel she would benefit from admission given this history for close monitoring with IV diuresis given worsening symptoms with oral diuretics at home.  Patient comfortable with this plan.  ED PROGRESS    2:10 AM  Discussed patient's case with hospitalist, Dr. Damita Dunnings.  I have recommended admission and patient (and family if present) agree with this plan. Admitting physician will place admission orders.   I reviewed all nursing notes, vitals, pertinent previous records and reviewed/interpreted all EKGs, lab and urine results, imaging (as  available).  ____________________________________________   FINAL CLINICAL IMPRESSION(S) / ED DIAGNOSES  Final diagnoses:  Pleural effusion  Anasarca  Peripheral edema     ED Discharge Orders     None       *Please note:  Karen Farley was evaluated in Emergency Department on 05/17/2021 for the symptoms described in the history of present illness. She was evaluated in the context of the global COVID-19 pandemic, which necessitated consideration that the patient might be at risk for infection with the SARS-CoV-2 virus that causes COVID-19. Institutional protocols and algorithms that pertain to the evaluation of patients at risk for COVID-19 are in a state of rapid change based on information released by regulatory bodies including the CDC and federal and state organizations. These policies and algorithms were followed during the patient's care in the ED.  Some ED evaluations and interventions may be delayed as a result of limited staffing during and the pandemic.*   Note:  This document was prepared using Dragon voice recognition software and may include unintentional dictation errors.    Kasch Borquez, Delice Bison, DO 05/17/21 [phone]

## 2021-05-17 NOTE — Consult Note (Signed)
Cardiology Consult    Patient ID: Karen Farley MRN: [id number], DOB/AGE: 1946/02/27   Admit date: 05/17/2021 Date of Consult: 05/17/2021  Primary Physician: Karen Roys, DO Primary Cardiologist: Karen Sable, MD Requesting Provider: Chauncey Cruel. Reesa Chew, MD  Patient Profile    Karen Farley is a 75 y.o. female with a history of permanent atrial fibrillation, HFpEF, stage III chronic kidney disease, hypertension, obesity, lymphedema and hyperlipidemia, who is being seen today for the evaluation of heart failure at the request of Dr. Reesa Farley.  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)    DDD (degenerative disc disease), lumbar    Degenerative disc disease, lumbar    bulging and dengerated   Diabetes mellitus without complication (HCC)    Hyperlipidemia    Hypertension    Osteoarthritis of both knees    Osteopenia     Past Surgical History:  Procedure Laterality Date   CHOLECYSTECTOMY     DILATION AND CURETTAGE OF UTERUS     TEAR DUCT PROBING WITH STRABISMUS REPAIR Right    TONSILLECTOMY       Allergies  No Known Allergies  History of Present Illness    75 year old female with the above complex past medical history including permanent atrial fibrillation, HFpEF, stage III chronic kidney disease, hypertension, hyperlipidemia, obesity, and lymphedema.  She was diagnosed with atrial fibrillation in January 2021 and has been on beta-blocker and Xarelto therapy.  Echocardiogram in February 2021 showed normal LV function with grade 2 diastolic dysfunction.  She was admitted to [facility] June 21 with presyncope and hypotension as well as acute kidney injury with creatinine up to 4.98.  She was diagnosed with right lower extremity cellulitis.  ACE inhibitor therapy and diuretics were initially held.  Torsemide was resumed at 20 mg every other day per nephrology at discharge.  Xarelto was changed to  Eliquis due to worsening renal function during hospitalization.  Post discharge, she had some increase in lower extremity swelling and pedal edema with blistering and weeping.  She was seen by primary care with her legs wrapped.    At cardiology follow-up July 7 she reported fatigue, intermittent lightheadedness, and mild dyspnea in the setting of being sedentary.  Her weight was 234 pounds, which was stable compared to prior to hospitalization.  In the setting of edema, she was advised to take an additional torsemide that day and follow-up with nephrology, which she did on July 14.  Weight was listed to 223 pounds, though patient does not believe she ever got that low.  She had ongoing lower extremity edema which was felt to be secondary to lymphedema and she was advised to use her lymphedema pumps.  She has been doing that twice daily for an hour per session and she says her weight came down from the 230s to 228.  Unfortunately, beginning this past weekend, she started noticing worsening dyspnea on exertion as well as increasing abdominal girth and bloating.  She noted shortness of breath with physical therapy on July 27 prompting her to call her primary care provider who recommended ED evaluation.  Here, she was found to have a large left pleural effusion on chest x-ray and significant lower extremity edema.  She was hypokalemic at 3.2.  Creatinine stable at 1.40.  Troponin is normal.  BNP minimally elevated at 122.6.  She has received intravenous Lasix and notes good output thus far.  Inpatient Medications     apixaban  5 mg Oral BID   furosemide  40 mg Intravenous Q12H   insulin aspart  0-15 Units Subcutaneous TID WC   insulin aspart  0-5 Units Subcutaneous QHS   metoprolol succinate  25 mg Oral Daily    Family History    Family History  Problem Relation Age of Onset   Heart disease Mother        CHF   Osteoporosis Mother    Breast cancer Mother    Heart disease Father 59   Cancer Brother         Colon CA- 2002- Youngest brother   Parkinson's disease Brother        Younger Brother   Heart disease Brother        2 brothers   Cancer Brother    Breast cancer Maternal Grandmother    She indicated that her mother is deceased. She indicated that her father is deceased. She indicated that her sister is alive. She indicated that only one of her two brothers is alive. She indicated that her maternal grandmother is deceased.   Social History    Social History   Socioeconomic History   Marital status: Married    Spouse name: Not on file   Number of children: Not on file   Years of education: Not on file   Highest education level: Not on file  Occupational History   Not on file  Tobacco Use   Smoking status: Never   Smokeless tobacco: Never  Vaping Use   Vaping Use: Never used  Substance and Sexual Activity   Alcohol use: Not Currently    Alcohol/week: 0.0 standard drinks    Comment: on rare occasion   Drug use: No   Sexual activity: Not Currently  Other Topics Concern   Not on file  Social History Narrative   Not on file   Social Determinants of Health   Financial Resource Strain: Low Risk    Difficulty of Paying Living Expenses: Not hard at all  Food Insecurity: No Food Insecurity   Worried About Charity fundraiser in the Last Year: Never true   [place] in the Last Year: Never true  Transportation Needs: No Transportation Needs   Lack of Transportation (Medical): No   Lack of Transportation (Non-Medical): No  Physical Activity: Inactive   Days of Exercise per Week: 0 days   Minutes of Exercise per Session: 0 min  Stress: No Stress Concern Present   Feeling of Stress : Not at all  Social Connections: Not on file  Intimate Partner Violence: Not on file     Review of Systems    General:  No chills, fever, night sweats or weight changes.  Cardiovascular:  No chest pain, +++ dyspnea on exertion, +++ bilat LE edema and increased girth, +++ chronic  orthopnea, no palpitations, paroxysmal nocturnal dyspnea. Dermatological: No rash, lesions/masses Respiratory: No cough, +++ dyspnea Urologic: No hematuria, dysuria Abdominal:   Increase in abdominal bloating girth recently.  No nausea, vomiting, diarrhea, bright red blood per rectum, melena, or hematemesis Neurologic:  No visual changes, wkns, changes in mental status. All other systems reviewed and are otherwise negative except as noted above.  Physical Exam    Blood pressure (!) 128/92, pulse 72, temperature 98.6 F (37 C), temperature source Oral, resp. rate 20, height 5\' 2"  (1.575 m), weight 103.4 kg, SpO2 96 %.  General: Pleasant, obese NAD Psych: Normal affect. Neuro:  Alert and oriented X 3. Moves all extremities spontaneously. HEENT: Normal  Neck: Supple without bruits or JVD. Lungs:  Resp regular and unlabored, diminished breath sounds at the left base.  Otherwise clear to auscultation. Heart: Irregularly irregular, distant, 1/6 systolic murmur loudest at the upper sternal borders, no no s3, s4. Abdomen: Soft, non-tender, non-distended, BS + x 4.  Extremities: No clubbing, cyanosis.  2+-3+ bilateral lower extremity and pedal edema.  DP/PT 1+, Radials 2+ and equal bilaterally.  Labs    Cardiac Enzymes Recent Labs  Lab 05/16/21 1744 05/16/21 2028  TROPONINIHS 12 13      Lab Results  Component Value Date   WBC 8.1 05/16/2021   HGB 13.4 05/16/2021   HCT 42.3 05/16/2021   MCV 89.2 05/16/2021   PLT 310 05/16/2021    Recent Labs  Lab 05/17/21 0630  NA 137  K 3.5  CL 95*  CO2 27  BUN 28*  CREATININE 1.26*  CALCIUM 8.5*  GLUCOSE 130*   Lab Results  Component Value Date   CHOL 210 (H) 01/15/2021   HDL 49 01/15/2021   LDLCALC 139 (H) 01/15/2021   TRIG 120 01/15/2021     Radiology Studies    DG Chest 2 View  Result Date: 05/16/2021 CLINICAL DATA:  Shortness of breath and lower extremity swelling. EXAM: CHEST - 2 VIEW COMPARISON:  April 10, 2021 FINDINGS:  There is opacification of the mid and lower left lung. This is increased in severity when compared to the prior study. A left pleural effusion is also noted. The right lung is clear. No pneumothorax is seen. The cardiac silhouette is enlarged and unchanged in size. The visualized skeletal structures are unremarkable. IMPRESSION: 1. Worsening left basilar atelectasis and/or infiltrate with associated left-sided pleural effusion. Electronically Signed   By: Virgina Norfolk M.D.   On: 05/16/2021 19:06    ECG & Cardiac Imaging    Atrial fibrillation, 85, low voltage, no acute ST or T changes - personally reviewed.  Assessment & Plan    1.  Acute on chronic heart failure with preserved ejection fraction: Patient with history of HFpEF and EF of 60 to 65% by echo in February 2021.  Recent hospitalization in June in the setting of hypotension and acute kidney injury with resultant reduction in diuretic dosing to torsemide 20 mg 3 times a week.  Since discharge, she has noted some increase in lower extremity swelling, weight gain and, more recently increasing abdominal girth and dyspnea.  Lower extremity swelling has been reasonably managed with lymphedema pumps but due to dyspnea and abdominal bloating, she presented to the ED on July 27.  She is hemodynamically stable and labs show stable renal function with a creatinine of 1.26 this morning.  Her BNP is only minimally elevated at 122.6.  Troponins are normal.  Most notable finding is large left pleural effusion.  Patient has received intravenous Lasix with good output thus far.  She does have significant lower extremity edema on exam, but notes that she sat in the ED for much of yesterday and was only able to use her compression pumps 1 time, prior to presenting.  Continue IV diuresis with close monitoring of renal function.  If no significant change in pleural effusion despite diuresis, may need to hold Eliquis for thoracentesis.  2.  Left pleural effusion:  Moderate to large by chest x-ray.  Certainly contributing to dyspnea.  Diuresis as above.  Low threshold for thoracentesis if appropriate.  May need to hold  Eliquis.  3.  Essential hypertension: Relatively stable on beta-blocker therapy.  4.  Permanent atrial fibrillation: Rate well controlled on beta-blocker therapy.  She is anticoagulated with Eliquis.  5.  Hypokalemia: Potassium was 3.2 on arrival.  3.2 this morning.  Continue supplementation as needed.  6.  Lower extremity lymphedema: Uses pumps at home.  Will need compression while hospitalized.  In the setting of lymphedema, lower extremity edema by itself is likely a poor indicator of overall volume, as noted by minimally elevated BNP.  7.  Stage III chronic kidney disease: Followed closely by nephrology.  Recent admission for renal failure in the setting of diuretic and ACE inhibitor therapy.  Outpatient diuretic dosing limited to torsemide 20 mg every other day since June.  Currently receiving intravenous Lasix in the setting of volume overload and pleural effusion.  Will want nephrology opinion regarding outpatient diuretic dosing at discharge.  Signed, Murray Hodgkins, NP 05/17/2021, 8:33 AM  For questions or updates, please contact   Please consult [email or web address] for contact info under Cardiology/STEMI.

## 2021-05-17 NOTE — ED Notes (Signed)
Took over patient care, pt resting in room no complaints at this time

## 2021-05-18 DIAGNOSIS — I872 Venous insufficiency (chronic) (peripheral): Secondary | ICD-10-CM

## 2021-05-18 DIAGNOSIS — I5033 Acute on chronic diastolic (congestive) heart failure: Secondary | ICD-10-CM | POA: Diagnosis not present

## 2021-05-18 DIAGNOSIS — I480 Paroxysmal atrial fibrillation: Secondary | ICD-10-CM | POA: Diagnosis not present

## 2021-05-18 DIAGNOSIS — E8770 Fluid overload, unspecified: Secondary | ICD-10-CM | POA: Diagnosis not present

## 2021-05-18 DIAGNOSIS — I4821 Permanent atrial fibrillation: Secondary | ICD-10-CM | POA: Diagnosis not present

## 2021-05-18 LAB — GLUCOSE, CAPILLARY
Glucose-Capillary: 129 mg/dL — ABNORMAL HIGH (ref 70–99)
Glucose-Capillary: 151 mg/dL — ABNORMAL HIGH (ref 70–99)
Glucose-Capillary: 173 mg/dL — ABNORMAL HIGH (ref 70–99)
Glucose-Capillary: 119 mg/dL — ABNORMAL HIGH (ref 70–99)

## 2021-05-18 LAB — CBC
HCT: 41.8 % (ref 36.0–46.0)
Hemoglobin: 13.1 g/dL (ref 12.0–15.0)
MCH: 28 pg (ref 26.0–34.0)
MCHC: 31.3 g/dL (ref 30.0–36.0)
MCV: 89.3 fL (ref 80.0–100.0)
Platelets: 314 10*3/uL (ref 150–400)
RBC: 4.68 MIL/uL (ref 3.87–5.11)
RDW: 16.5 % — ABNORMAL HIGH (ref 11.5–15.5)
WBC: 7.6 10*3/uL (ref 4.0–10.5)
nRBC: 0 % (ref 0.0–0.2)

## 2021-05-18 LAB — HEMOGLOBIN A1C
Hgb A1c MFr Bld: 6.6 % — ABNORMAL HIGH (ref 4.8–5.6)
Mean Plasma Glucose: 143 mg/dL

## 2021-05-18 LAB — BASIC METABOLIC PANEL
Anion gap: 14 (ref 5–15)
BUN: 29 mg/dL — ABNORMAL HIGH (ref 8–23)
CO2: 32 mmol/L (ref 22–32)
Calcium: 9.3 mg/dL (ref 8.9–10.3)
Chloride: 95 mmol/L — ABNORMAL LOW (ref 98–111)
Creatinine, Ser: 1.41 mg/dL — ABNORMAL HIGH (ref 0.44–1.00)
GFR, Estimated: 39 mL/min — ABNORMAL LOW (ref >60)
Glucose, Bld: 127 mg/dL — ABNORMAL HIGH (ref 70–99)
Potassium: 3.3 mmol/L — ABNORMAL LOW (ref 3.5–5.1)
Sodium: 141 mmol/L (ref 135–145)

## 2021-05-18 MED ORDER — POTASSIUM CHLORIDE CRYS ER 20 MEQ PO TBCR
40.0000 meq | EXTENDED_RELEASE_TABLET | Freq: Once | ORAL | Status: AC
  Administered 2021-05-18: 40 meq via ORAL
  Filled 2021-05-18: qty 2

## 2021-05-18 NOTE — Consult Note (Signed)
   Heart Failure Nurse Navigator Note  HFpEF 60 to 65%.  Indeterminate diastolic parameters.  Number right ventricular systolic function.  Mild to moderate left atrial.   She presented to the emergency room with complaints of fluid retention.  And dyspnea on exertion.  Comorbidities:  Chronic kidney disease stage III Diabetes type 1 Hyperlipidemia Hypertension Osteoarthritis  Medications:  Apixaban 5 mg 2 times a day Lasix 40 mg IV every 12 Metoprolol succinate 25 mg 1 daily   Labs:  Sodium 141, potassium 3.3, chloride 95, CO2 32, BUN 29, creatinine 1.4 up from 1.26 of yesterday Intake not documented Output 700 mL Weight 102.9 yesterday 103.4 kg    Initial meeting with patient today.   Discussed the types of heart failure.  States that she is not used salt since she had trouble with retaining fluid at age 75 with a pregnancy.  She states with her back pain that she is pretty inactive.  She states that her husband does the cooking and takes care of her.  Discussed her fluid intake does not appear to take in more than 64 ounces in a days time.  She states that she mostly drinks water.  She states that she had been compliant with the lymphedema pumps and felt that her legs were getting smaller but all the fluid ended up in her abdomen.  She states that she was compliant with her medications.  She was given the living with heart failure teaching booklet, zone magnet and information on low-sodium.  Discussed the outpatient heart failure clinic and upcoming appointment.  She states that right now she has a lot of other doctor appointments between her primary, cardiology and the kidney doctor.   Pricilla Riffle RN [facility]

## 2021-05-18 NOTE — Progress Notes (Signed)
PROGRESS NOTE    Karen Farley  WUJ:[id number] DOB: July 02, 1946 DOA: 05/17/2021 PCP: Valerie Roys, DO    Brief Narrative:  Karen Farley is a 75 y.o. female with medical history significant for HFpEF (EF 60-65% on echo 11/2019), atrial fibrillation on Eliquis, essential hypertension, hyperlipidemia, chronic lymphedema, chronic venous insufficiency, PAD, type 2 diabetes, CKD stage IIIb is admitted for heart failure exacerbation.  She was recently hospitalized in March 22, 2001 2 in the setting of hypotension and acute kidney injury requiring reduction in diuretic dose of torsemide 20 mg 3 times a week.  She continued to get more lower extremity swelling, weight gain and increase in abdominal girth requiring ED visit and admission now.  Consultants:  Cardiology  Procedures: None  Antimicrobials: None  Subjective: She slept very well last night.  Now sitting in the chair feeling somewhat better compared to yesterday.  Reports having small bowel movement this morning Objective: Vitals:   05/17/21 1836 05/17/21 1947 05/18/21 0332 05/18/21 0752  BP: 125/77 120/73 95/67 112/75  Pulse: 81 85 82 90  Resp: 18 18 18 18   Temp: 98.1 F (36.7 C) 98 F (36.7 C) 98 F (36.7 C) 98.2 F (36.8 C)  TempSrc: Oral   Oral  SpO2: 97% 92% 94% 99%  Weight:   102.9 kg   Height:        Intake/Output Summary (Last 24 hours) at 05/18/2021 1309 Last data filed at 05/18/2021 1017 Gross per 24 hour  Intake 360 ml  Output 700 ml  Net -340 ml   Filed Weights   05/16/21 1738 05/18/21 0332  Weight: 103.4 kg 102.9 kg    Examination: 75 year old obese female sitting in chair, calm, NAD Neck supple, no JVD Lungs clear to auscultation no wheezing Cardiovascular: Irregularly irregular heart sounds.  1/6 systolic ejection murmur at the sternal border. Abdomen soft benign, increase girth Extremities 2-3+ lower extremity edema bilaterally,  Neuro Alert alert oriented x4 grossly intact Skin: No rash  or lesion    Data Reviewed: I have personally reviewed following labs and imaging studies  CBC: Recent Labs  Lab 05/16/21 1744 05/18/21 0643  WBC 8.1 7.6  HGB 13.4 13.1  HCT 42.3 41.8  MCV 89.2 89.3  PLT 310 956   Basic Metabolic Panel: Recent Labs  Lab 05/16/21 1744 05/17/21 0630 05/18/21 0643  NA 138 137 141  K 3.2* 3.5 3.3*  CL 95* 95* 95*  CO2 30 27 32  GLUCOSE 125* 130* 127*  BUN 31* 28* 29*  CREATININE 1.40* 1.26* 1.41*  CALCIUM 9.3 8.5* 9.3   GFR: Estimated Creatinine Clearance: 38.7 mL/min (A) (by C-G formula based on SCr of 1.41 mg/dL (H)). Liver Function Tests: No results for input(s): AST, ALT, ALKPHOS, BILITOT, PROT, ALBUMIN in the last 168 hours.  No results for input(s): LIPASE, AMYLASE in the last 168 hours. No results for input(s): AMMONIA in the last 168 hours. Coagulation Profile: No results for input(s): INR, PROTIME in the last 168 hours. Cardiac Enzymes: No results for input(s): CKTOTAL, CKMB, CKMBINDEX, TROPONINI in the last 168 hours. BNP (last 3 results) No results for input(s): PROBNP in the last 8760 hours. HbA1C: Recent Labs    05/17/21 0630  HGBA1C 6.6*   CBG: Recent Labs  Lab 05/17/21 1148 05/17/21 1745 05/17/21 2033 05/18/21 0749 05/18/21 1251  GLUCAP 157* 129* 133* 129* 151*   Lipid Profile: No results for input(s): CHOL, HDL, LDLCALC, TRIG, CHOLHDL, LDLDIRECT in the last 72 hours. Thyroid Function  Tests: No results for input(s): TSH, T4TOTAL, FREET4, T3FREE, THYROIDAB in the last 72 hours.  Anemia Panel: No results for input(s): VITAMINB12, FOLATE, FERRITIN, TIBC, IRON, RETICCTPCT in the last 72 hours. Sepsis Labs: Recent Labs  Lab 05/17/21 0630  PROCALCITON <0.10    Recent Results (from the past 240 hour(s))  Resp Panel by RT-PCR (Flu A&B, Covid) Nasopharyngeal Swab     Status: None   Collection Time: 05/17/21  5:20 AM   Specimen: Nasopharyngeal Swab; Nasopharyngeal(NP) swabs in vial transport medium   Result Value Ref Range Status   SARS Coronavirus 2 by RT PCR NEGATIVE NEGATIVE Final    Comment: (NOTE) SARS-CoV-2 target nucleic acids are NOT DETECTED.  The SARS-CoV-2 RNA is generally detectable in upper respiratory specimens during the acute phase of infection. The lowest concentration of SARS-CoV-2 viral copies this assay can detect is 138 copies/mL. A negative result does not preclude SARS-Cov-2 infection and should not be used as the sole basis for treatment or other patient management decisions. A negative result may occur with  improper specimen collection/handling, submission of specimen other than nasopharyngeal swab, presence of viral mutation(s) within the areas targeted by this assay, and inadequate number of viral copies(<138 copies/mL). A negative result must be combined with clinical observations, patient history, and epidemiological information. The expected result is Negative.  Fact Sheet for Patients:  EntrepreneurPulse.com.au  Fact Sheet for Healthcare Providers:  IncredibleEmployment.be  This test is no t yet approved or cleared by the [place] FDA and  has been authorized for detection and/or diagnosis of SARS-CoV-2 by FDA under an Emergency Use Authorization (EUA). This EUA will remain  in effect (meaning this test can be used) for the duration of the COVID-19 declaration under Section 564(b)(1) of the Act, 21 U.S.C.section 360bbb-3(b)(1), unless the authorization is terminated  or revoked sooner.       Influenza A by PCR NEGATIVE NEGATIVE Final   Influenza B by PCR NEGATIVE NEGATIVE Final    Comment: (NOTE) The Xpert Xpress SARS-CoV-2/FLU/RSV plus assay is intended as an aid in the diagnosis of influenza from Nasopharyngeal swab specimens and should not be used as a sole basis for treatment. Nasal washings and aspirates are unacceptable for Xpert Xpress SARS-CoV-2/FLU/RSV testing.  Fact Sheet for  Patients: EntrepreneurPulse.com.au  Fact Sheet for Healthcare Providers: IncredibleEmployment.be  This test is not yet approved or cleared by the [place] FDA and has been authorized for detection and/or diagnosis of SARS-CoV-2 by FDA under an Emergency Use Authorization (EUA). This EUA will remain in effect (meaning this test can be used) for the duration of the COVID-19 declaration under Section 564(b)(1) of the Act, 21 U.S.C. section 360bbb-3(b)(1), unless the authorization is terminated or revoked.  Performed at [facility], [address], [place], [place] [address]      Radiology Studies: DG Chest 2 View  Result Date: 05/16/2021 CLINICAL DATA:  Shortness of breath and lower extremity swelling. EXAM: CHEST - 2 VIEW COMPARISON:  April 10, 2021 FINDINGS: There is opacification of the mid and lower left lung. This is increased in severity when compared to the prior study. A left pleural effusion is also noted. The right lung is clear. No pneumothorax is seen. The cardiac silhouette is enlarged and unchanged in size. The visualized skeletal structures are unremarkable. IMPRESSION: 1. Worsening left basilar atelectasis and/or infiltrate with associated left-sided pleural effusion. Electronically Signed   By: Virgina Norfolk M.D.   On: 05/16/2021 19:06   ECHOCARDIOGRAM COMPLETE  Result  Date: 05/17/2021    ECHOCARDIOGRAM REPORT   Patient Name:   STAVROULA ROHDE Date of Exam: 05/17/2021 Medical Rec #:  [id number]          Height:       62.0 in Accession #:    [id number]         Weight:       228.0 lb Date of Birth:  12-12-1945           BSA:          2.021 m Patient Age:    68 years           BP:           110/88 mmHg Patient Gender: F                  HR:           77 bpm. Exam Location:  [facility] Procedure: 2D Echo, Cardiac Doppler and Color Doppler Indications:     CHF-acute diastolic V25.36  History:         Patient has prior history of  Echocardiogram examinations, most                  recent 12/01/2019. Risk Factors:Hypertension and Diabetes.  Sonographer:     Sherrie Sport RDCS (AE) Referring Phys:  [id number] [name] Diagnosing Phys: Ida Rogue MD  Sonographer Comments: Suboptimal apical window and suboptimal parasternal window. IMPRESSIONS  1. Left ventricular ejection fraction, by estimation, is 60 to 65%. The left ventricle has normal function. The left ventricle has no regional wall motion abnormalities. Left ventricular diastolic parameters are indeterminate.  2. Right ventricular systolic function is normal. The right ventricular size is normal. Tricuspid regurgitation signal is inadequate for assessing PA pressure.  3. Left atrial size was mild to moderately dilated.  4. Left pleural effusion noted FINDINGS  Left Ventricle: Left ventricular ejection fraction, by estimation, is 60 to 65%. The left ventricle has normal function. The left ventricle has no regional wall motion abnormalities. The left ventricular internal cavity size was normal in size. There is  no left ventricular hypertrophy. Left ventricular diastolic parameters are indeterminate. Right Ventricle: The right ventricular size is normal. No increase in right ventricular wall thickness. Right ventricular systolic function is normal. Tricuspid regurgitation signal is inadequate for assessing PA pressure. Left Atrium: Left atrial size was moderately dilated. Right Atrium: Right atrial size was normal in size. Pericardium: There is no evidence of pericardial effusion. Mitral Valve: The mitral valve is normal in structure. Mild mitral annular calcification. No evidence of mitral valve regurgitation. No evidence of mitral valve stenosis. Tricuspid Valve: The tricuspid valve is normal in structure. Tricuspid valve regurgitation is mild . No evidence of tricuspid stenosis. Aortic Valve: The aortic valve is normal in structure. Aortic valve regurgitation is not visualized. No  aortic stenosis is present. Aortic valve mean gradient measures 9.3 mmHg. Aortic valve peak gradient measures 16.4 mmHg. Aortic valve area, by VTI measures 1.26 cm. Pulmonic Valve: The pulmonic valve was normal in structure. Pulmonic valve regurgitation is not visualized. No evidence of pulmonic stenosis. Aorta: The aortic root is normal in size and structure. Venous: The inferior vena cava is normal in size with greater than 50% respiratory variability, suggesting right atrial pressure of 3 mmHg. IAS/Shunts: No atrial level shunt detected by color flow Doppler.  LEFT VENTRICLE PLAX 2D LVIDd:         3.83 cm LVIDs:  2.37 cm LV PW:         1.15 cm LV IVS:        0.95 cm LVOT diam:     2.00 cm LV SV:         49 LV SV Index:   24 LVOT Area:     3.14 cm  RIGHT VENTRICLE RV Basal diam:  2.71 cm LEFT ATRIUM              Index       RIGHT ATRIUM           Index LA diam:        5.40 cm  2.67 cm/m  RA Area:     13.20 cm LA Vol (A2C):   95.6 ml  47.30 ml/m RA Volume:   30.30 ml  14.99 ml/m LA Vol (A4C):   94.6 ml  46.80 ml/m LA Biplane Vol: 102.0 ml 50.47 ml/m  AORTIC VALVE                    PULMONIC VALVE AV Area (Vmax):    1.24 cm     PV Vmax:        0.60 m/s AV Area (Vmean):   1.24 cm     PV Peak grad:   1.4 mmHg AV Area (VTI):     1.26 cm     RVOT Peak grad: 2 mmHg AV Vmax:           202.33 cm/s AV Vmean:          143.000 cm/s AV VTI:            0.386 m AV Peak Grad:      16.4 mmHg AV Mean Grad:      9.3 mmHg LVOT Vmax:         79.60 cm/s LVOT Vmean:        56.600 cm/s LVOT VTI:          0.155 m LVOT/AV VTI ratio: 0.40  AORTA Ao Root diam: 2.60 cm MITRAL VALVE                TRICUSPID VALVE MV Area (PHT): 3.97 cm     TR Peak grad:   17.0 mmHg MV Decel Time: 191 msec     TR Vmax:        206.00 cm/s MV E velocity: 132.00 cm/s                             SHUNTS                             Systemic VTI:  0.16 m                             Systemic Diam: 2.00 cm Ida Rogue MD Electronically signed by  Ida Rogue MD Signature Date/Time: 05/17/2021/1:55:11 PM    Final     Scheduled Meds:  apixaban  5 mg Oral BID   furosemide  40 mg Intravenous Q12H   insulin aspart  0-15 Units Subcutaneous TID WC   insulin aspart  0-5 Units Subcutaneous QHS   metoprolol succinate  25 mg Oral Daily    Assessment & Plan:   Principal Problem:   Volume overload Active Problems:   Chronic venous insufficiency   Atrial fibrillation (HCC)  Type 2 diabetes mellitus (HCC)   PAD (peripheral artery disease) (HCC)   Stage 3b chronic kidney disease (HCC)   Pleural effusion, left   Acute on chronic diastolic CHF (congestive heart failure) (HCC)   Obesity, Class III, BMI 40-49.9 (morbid obesity) ([place])   Chronic anticoagulation  75 year old female with history of diastolic CHF, permanent A. fib on Eliquis, DM, CKD 3B, morbid obesity, hospitalized 6/21-6/29 with hypotension and AKI in part related to poor tolerance of diuretic regimen presenting with increasing swelling to lower extremities and abdomen and spite of outpatient up titration of torsemide by her primary cardiologist.       Anasarca   Acute on chronic diastolic CHF  - Patient with fluid retention not responding to outpatient increase in torsemide, - History of intolerance to diuretic intensification: presyncope/hypotension with AKI - CXR with left pleural effusion -moderate - Continue IV Lasix 40 mg twice daily with close BP monitoring and monitoring of renal function - Daily weights with intake and output monitoring -Her last recorded weight at cardiology office on July 4 was 234 pounds.  Her weight this morning is recorded as 227 pounds -Her I's and O's do not be accurate if her weight is accurate Net IO Since Admission: -340 mL [05/18/21 1320]  - Repeat echo this admission shows EF of 60 to 65% - Cardiology following   Permanent atrial fibrillation (HCC)   Chronic anticoagulation - Continue Toprol 25 mg p.o. daily for rate control -  Continue Eliquis(was recently switched from Xarelto due to worsening renal function). CHA2DS2-VASc score of 5     Type 2 diabetes mellitus (HCC) - Sliding scale insulin coverage while in the hospital -Her last hemoglobin A1c in March was 7.3     Stage 3b chronic kidney disease ([place]) - Monitor renal function in view of recent AKI to creatinine over 4 - Consider nephrology consult if worsening     Obesity, Class III, BMI 40-49.9 (morbid obesity) ([place]) - Complicating factor to overall prognosis and care   DVT prophylaxis: Eliquis Code Status: Full Family Communication: Updated Husband Jori Moll over phone on 7/29 Disposition Plan: Diuresis for now, may be another 1-2 days before D/C plans Status is: Inpatient  Remains inpatient appropriate because:IV treatments appropriate due to intensity of illness or inability to take PO and Inpatient level of care appropriate due to severity of illness  Dispo: The patient is from: Home              Anticipated d/c is to: Home              Patient currently is not medically stable to d/c.   Difficult to place patient No     LOS: 0 days   Time spent: 35 minutes with more than 50% on COC    Max Sane, MD [facility] Pager [phone]  If 7PM-7AM, please contact night-coverage 05/18/2021, 1:09 PM

## 2021-05-18 NOTE — Progress Notes (Signed)
Progress Note  Patient Name: Karen Farley Date of Encounter: 05/18/2021  Primary Cardiologist: Kate Sable, MD  Subjective   Breathing seems improved.  Sl less lower ext edema.  Noted good urine output w/ IV lasix.  Inpatient Medications    Scheduled Meds:  apixaban  5 mg Oral BID   furosemide  40 mg Intravenous Q12H   insulin aspart  0-15 Units Subcutaneous TID WC   insulin aspart  0-5 Units Subcutaneous QHS   metoprolol succinate  25 mg Oral Daily   Continuous Infusions:  PRN Meds: acetaminophen **OR** acetaminophen, ondansetron **OR** ondansetron (ZOFRAN) IV   Vital Signs    Vitals:   05/17/21 1836 05/17/21 1947 05/18/21 0332 05/18/21 0752  BP: 125/77 120/73 95/67 112/75  Pulse: 81 85 82 90  Resp: 18 18 18 18   Temp: 98.1 F (36.7 C) 98 F (36.7 C) 98 F (36.7 C) 98.2 F (36.8 C)  TempSrc: Oral   Oral  SpO2: 97% 92% 94% 99%  Weight:   102.9 kg   Height:        Intake/Output Summary (Last 24 hours) at 05/18/2021 1345 Last data filed at 05/18/2021 1017 Gross per 24 hour  Intake 360 ml  Output 700 ml  Net -340 ml   Filed Weights   05/16/21 1738 05/18/21 0332  Weight: 103.4 kg 102.9 kg    Physical Exam   GEN: Obese, in no acute distress.  HEENT: Grossly normal.  Neck: Supple, mildly elev JVP - somewhat difficult to gauge 2/2 body habitus.  No carotid bruits, or masses. Cardiac: IR, IR, 2/6 SEM @ upper sternal borders, no rubs, or gallops. No clubbing, cyanosis.  1+ bilat ankle/pedal edema w/ mild bilat lower leg erythema.  Radials 2+, DP/PT 1+ and equal bilaterally.  Respiratory:  Respirations regular and unlabored, diminished breath sounds @ L base. GI: Obese, soft, nontender, nondistended, BS + x 4. MS: no deformity or atrophy. Skin: warm and dry, no rash. Neuro:  Strength and sensation are intact. Psych: AAOx3.  Normal affect.  Labs    Chemistry Recent Labs  Lab 05/16/21 1744 05/17/21 0630 05/18/21 0643  NA 138 137 141  K 3.2*  3.5 3.3*  CL 95* 95* 95*  CO2 30 27 32  GLUCOSE 125* 130* 127*  BUN 31* 28* 29*  CREATININE 1.40* 1.26* 1.41*  CALCIUM 9.3 8.5* 9.3  GFRNONAA 39* 45* 39*  ANIONGAP 13 15 14      Hematology Recent Labs  Lab 05/16/21 1744 05/18/21 0643  WBC 8.1 7.6  RBC 4.74 4.68  HGB 13.4 13.1  HCT 42.3 41.8  MCV 89.2 89.3  MCH 28.3 28.0  MCHC 31.7 31.3  RDW 16.3* 16.5*  PLT 310 314    Cardiac Enzymes  Recent Labs  Lab 05/16/21 1744 05/16/21 2028  TROPONINIHS 12 13      BNP Recent Labs  Lab 05/16/21 1740  BNP 122.6*     Lipids  Lab Results  Component Value Date   CHOL 210 (H) 01/15/2021   HDL 49 01/15/2021   LDLCALC 139 (H) 01/15/2021   TRIG 120 01/15/2021    HbA1c  Lab Results  Component Value Date   HGBA1C 6.6 (H) 05/17/2021    Radiology    DG Chest 2 View  Result Date: 05/16/2021 CLINICAL DATA:  Shortness of breath and lower extremity swelling. EXAM: CHEST - 2 VIEW COMPARISON:  April 10, 2021 FINDINGS: There is opacification of the mid and lower left lung. This is increased  in severity when compared to the prior study. A left pleural effusion is also noted. The right lung is clear. No pneumothorax is seen. The cardiac silhouette is enlarged and unchanged in size. The visualized skeletal structures are unremarkable. IMPRESSION: 1. Worsening left basilar atelectasis and/or infiltrate with associated left-sided pleural effusion. Electronically Signed   By: Virgina Norfolk M.D.   On: 05/16/2021 19:06   Telemetry    Afib  70s-80s - Personally Reviewed  Cardiac Studies   2D Echocardiogram 7.28.2022   1. Left ventricular ejection fraction, by estimation, is 60 to 65%. The  left ventricle has normal function. The left ventricle has no regional  wall motion abnormalities. Left ventricular diastolic parameters are  indeterminate.   2. Right ventricular systolic function is normal. The right ventricular  size is normal. Tricuspid regurgitation signal is inadequate  for assessing  PA pressure.   3. Left atrial size was mild to moderately dilated.   4. Left pleural effusion noted   Patient Profile     Karen Farley is a 75 y.o. female with a history of permanent atrial fibrillation, HFpEF, stage III chronic kidney disease, hypertension, obesity, lymphedema and hyperlipidemia, who was admitted 7/28 w/ acute on chronic hfpef and mod L pleural effusion.  Assessment & Plan    1.  Acute on chronic heart failure with preserved ejection fraction: -700. Wt down from 103.4kg  102.9kg (down 3.2kg since 7/7 and now nearing prior d/c wt of 102.1kg).  Patient with a history of HFpEF an EF of 60 to 65% by echo in February 2021.  Hospitalized in June 2022 in the setting of hypotension and acute kidney injury with resultant reduction in diuretic dosing to torsemide 20 mg 3 times a week.  Increasing lower extremity swelling, increased abdominal girth, weight gain, and dyspnea over the past week or so, prompting ED evaluation July 27.  Chest x-ray with left pleural effusion.  BNP minimally elevated at 122.6.  She noted good urine output yesterday the only 700 mL was recorded.  No intake recorded.  Weight down slightly at 102.9 kg.  Lower extremity swelling improved-still 1+ bilaterally.  Creatinine up to 1.41, which is actually closer to her baseline.  Continue IV diuresis today.  I suspect she will need torsemide 20 mg daily at home.  In discussing her prior hospitalization, she noted that she had a prolonged period of anorexia and poor p.o. intake prior to hospitalization.  In the absence of anorexia, she likely has greater diuretic dosing needs.  2.  Left pleural effusion: Moderate to large by chest x-ray.  Diminished breath sounds at left base.  Certainly contributing to dyspnea.  Continue diuresis as above and consider following up chest x-ray in a.m.  Low threshold for thoracentesis if appropriate.  3.  Essential hypertension: Stable.  4.  Permanent atrial  fibrillation: Rate controlled on beta-blocker.  Continue anticoagulation with Eliquis.  5.  Hypokalemia: 3.3 this morning.  Supplementation ordered.  6.  Lower extremity lymphedema: Uses pumps at home.  Will need compression while hospitalized.  Lower extremity edema by itself is likely a poor indicator of her overall volume as evidenced by minimally elevated BNP on admission.  7.  Stage III chronic kidney disease: Followed closely by nephrology.  As noted above, she will likely need torsemide 20 mg daily at discharge and she will need to notify us for changes in p.o. intake so that we may adjust dosing and follow-up labs.  Signed, Murray Hodgkins, NP  05/18/2021,  1:45 PM    For questions or updates, please contact   Please consult [email or web address] for contact info under Cardiology/STEMI.

## 2021-05-19 DIAGNOSIS — I4821 Permanent atrial fibrillation: Secondary | ICD-10-CM | POA: Diagnosis present

## 2021-05-19 DIAGNOSIS — I4819 Other persistent atrial fibrillation: Secondary | ICD-10-CM | POA: Diagnosis not present

## 2021-05-19 DIAGNOSIS — Z8249 Family history of ischemic heart disease and other diseases of the circulatory system: Secondary | ICD-10-CM | POA: Diagnosis not present

## 2021-05-19 DIAGNOSIS — R601 Generalized edema: Secondary | ICD-10-CM

## 2021-05-19 DIAGNOSIS — Z803 Family history of malignant neoplasm of breast: Secondary | ICD-10-CM | POA: Diagnosis not present

## 2021-05-19 DIAGNOSIS — E8779 Other fluid overload: Secondary | ICD-10-CM | POA: Diagnosis not present

## 2021-05-19 DIAGNOSIS — E8770 Fluid overload, unspecified: Secondary | ICD-10-CM | POA: Diagnosis not present

## 2021-05-19 DIAGNOSIS — Z820 Family history of epilepsy and other diseases of the nervous system: Secondary | ICD-10-CM | POA: Diagnosis not present

## 2021-05-19 DIAGNOSIS — I872 Venous insufficiency (chronic) (peripheral): Secondary | ICD-10-CM | POA: Diagnosis present

## 2021-05-19 DIAGNOSIS — E876 Hypokalemia: Secondary | ICD-10-CM | POA: Diagnosis present

## 2021-05-19 DIAGNOSIS — I130 Hypertensive heart and chronic kidney disease with heart failure and stage 1 through stage 4 chronic kidney disease, or unspecified chronic kidney disease: Secondary | ICD-10-CM | POA: Diagnosis present

## 2021-05-19 DIAGNOSIS — N179 Acute kidney failure, unspecified: Secondary | ICD-10-CM | POA: Diagnosis present

## 2021-05-19 DIAGNOSIS — I5033 Acute on chronic diastolic (congestive) heart failure: Secondary | ICD-10-CM | POA: Diagnosis present

## 2021-05-19 DIAGNOSIS — Z20822 Contact with and (suspected) exposure to covid-19: Secondary | ICD-10-CM | POA: Diagnosis present

## 2021-05-19 DIAGNOSIS — J918 Pleural effusion in other conditions classified elsewhere: Secondary | ICD-10-CM | POA: Diagnosis present

## 2021-05-19 DIAGNOSIS — J90 Pleural effusion, not elsewhere classified: Secondary | ICD-10-CM

## 2021-05-19 DIAGNOSIS — I890 Lymphedema, not elsewhere classified: Secondary | ICD-10-CM | POA: Diagnosis present

## 2021-05-19 DIAGNOSIS — I739 Peripheral vascular disease, unspecified: Secondary | ICD-10-CM

## 2021-05-19 DIAGNOSIS — E871 Hypo-osmolality and hyponatremia: Secondary | ICD-10-CM | POA: Diagnosis not present

## 2021-05-19 DIAGNOSIS — E785 Hyperlipidemia, unspecified: Secondary | ICD-10-CM | POA: Diagnosis present

## 2021-05-19 DIAGNOSIS — R5381 Other malaise: Secondary | ICD-10-CM | POA: Diagnosis present

## 2021-05-19 DIAGNOSIS — Z7901 Long term (current) use of anticoagulants: Secondary | ICD-10-CM | POA: Diagnosis not present

## 2021-05-19 DIAGNOSIS — Z6841 Body Mass Index (BMI) 40.0 and over, adult: Secondary | ICD-10-CM | POA: Diagnosis not present

## 2021-05-19 DIAGNOSIS — E1051 Type 1 diabetes mellitus with diabetic peripheral angiopathy without gangrene: Secondary | ICD-10-CM | POA: Diagnosis present

## 2021-05-19 DIAGNOSIS — L03011 Cellulitis of right finger: Secondary | ICD-10-CM | POA: Diagnosis present

## 2021-05-19 DIAGNOSIS — R609 Edema, unspecified: Secondary | ICD-10-CM

## 2021-05-19 DIAGNOSIS — N1832 Chronic kidney disease, stage 3b: Secondary | ICD-10-CM | POA: Diagnosis present

## 2021-05-19 DIAGNOSIS — Z800 Family history of malignant neoplasm of digestive organs: Secondary | ICD-10-CM | POA: Diagnosis not present

## 2021-05-19 DIAGNOSIS — Z79899 Other long term (current) drug therapy: Secondary | ICD-10-CM | POA: Diagnosis not present

## 2021-05-19 DIAGNOSIS — E1022 Type 1 diabetes mellitus with diabetic chronic kidney disease: Secondary | ICD-10-CM | POA: Diagnosis present

## 2021-05-19 LAB — BASIC METABOLIC PANEL
Anion gap: 14 (ref 5–15)
BUN: 34 mg/dL — ABNORMAL HIGH (ref 8–23)
CO2: 29 mmol/L (ref 22–32)
Calcium: 9 mg/dL (ref 8.9–10.3)
Chloride: 95 mmol/L — ABNORMAL LOW (ref 98–111)
Creatinine, Ser: 1.52 mg/dL — ABNORMAL HIGH (ref 0.44–1.00)
GFR, Estimated: 36 mL/min — ABNORMAL LOW (ref >60)
Glucose, Bld: 132 mg/dL — ABNORMAL HIGH (ref 70–99)
Potassium: 3.4 mmol/L — ABNORMAL LOW (ref 3.5–5.1)
Sodium: 138 mmol/L (ref 135–145)

## 2021-05-19 LAB — GLUCOSE, CAPILLARY
Glucose-Capillary: 194 mg/dL — ABNORMAL HIGH (ref 70–99)
Glucose-Capillary: 157 mg/dL — ABNORMAL HIGH (ref 70–99)
Glucose-Capillary: 151 mg/dL — ABNORMAL HIGH (ref 70–99)
Glucose-Capillary: 142 mg/dL — ABNORMAL HIGH (ref 70–99)
Glucose-Capillary: 138 mg/dL — ABNORMAL HIGH (ref 70–99)

## 2021-05-19 LAB — CBC
HCT: 39.4 % (ref 36.0–46.0)
Hemoglobin: 12.6 g/dL (ref 12.0–15.0)
MCH: 28.1 pg (ref 26.0–34.0)
MCHC: 32 g/dL (ref 30.0–36.0)
MCV: 87.9 fL (ref 80.0–100.0)
Platelets: 276 10*3/uL (ref 150–400)
RBC: 4.48 MIL/uL (ref 3.87–5.11)
RDW: 16.3 % — ABNORMAL HIGH (ref 11.5–15.5)
WBC: 6.3 10*3/uL (ref 4.0–10.5)
nRBC: 0 % (ref 0.0–0.2)

## 2021-05-19 MED ORDER — VITAMIN D3 25 MCG (1000 UNIT) PO TABS
2000.0000 [IU] | ORAL_TABLET | Freq: Every day | ORAL | Status: DC
  Administered 2021-05-19 – 2021-05-27 (×9): 2000 [IU] via ORAL
  Filled 2021-05-19 (×18): qty 2

## 2021-05-19 MED ORDER — ADULT MULTIVITAMIN W/MINERALS CH
1.0000 | ORAL_TABLET | Freq: Every day | ORAL | Status: DC
  Administered 2021-05-19 – 2021-05-27 (×9): 1 via ORAL
  Filled 2021-05-19 (×9): qty 1

## 2021-05-19 MED ORDER — LATANOPROST 0.005 % OP SOLN
1.0000 [drp] | Freq: Every day | OPHTHALMIC | Status: DC
  Administered 2021-05-19 – 2021-05-26 (×8): 1 [drp] via OPHTHALMIC
  Filled 2021-05-19: qty 2.5

## 2021-05-19 MED ORDER — POTASSIUM CHLORIDE CRYS ER 20 MEQ PO TBCR
20.0000 meq | EXTENDED_RELEASE_TABLET | Freq: Every day | ORAL | Status: DC
  Administered 2021-05-19 – 2021-05-22 (×4): 20 meq via ORAL
  Filled 2021-05-19 (×3): qty 1

## 2021-05-19 NOTE — Progress Notes (Signed)
Progress Note  Patient Name: Karen Farley Date of Encounter: 05/19/2021  [facility] Cardiologist: Karen Sable, MD   Subjective   No complaints, sitting up in recliner legs elevated Discussed prior use of lymphedema compression pumps, has only used this for 1 week Discussed prior issues with her legs including weeping sores requiring leg wraps She is unable to put lymphedema compression pumps in place requires her husband to put them on for her   Inpatient Medications    Scheduled Meds:  apixaban  5 mg Oral BID   furosemide  40 mg Intravenous Q12H   insulin aspart  0-15 Units Subcutaneous TID WC   insulin aspart  0-5 Units Subcutaneous QHS   metoprolol succinate  25 mg Oral Daily   Continuous Infusions:  PRN Meds: acetaminophen **OR** acetaminophen, ondansetron **OR** ondansetron (ZOFRAN) IV   Vital Signs    Vitals:   05/18/21 2044 05/19/21 0448 05/19/21 0500 05/19/21 0750  BP: 119/70 (!) 111/57  121/69  Pulse: 98 93  73  Resp: 17   17  Temp: 98.6 F (37 C) 98 F (36.7 C)  97.9 F (36.6 C)  TempSrc: Oral     SpO2: 97% 91%  97%  Weight:   102.5 kg   Height:        Intake/Output Summary (Last 24 hours) at 05/19/2021 0947 Last data filed at 05/19/2021 0900 Gross per 24 hour  Intake 1300 ml  Output 200 ml  Net 1100 ml   Last 3 Weights 05/19/2021 05/18/2021 05/16/2021  Weight (lbs) 225 lb 15.5 oz 226 lb 14.4 oz 228 lb  Weight (kg) 102.5 kg 102.921 kg 103.42 kg      Telemetry    nsr- Personally Reviewed  ECG    - Personally Reviewed  Physical Exam   GEN: No acute distress.  Morbidly obese Neck: Unable to estimate JVD Cardiac: RRR, no murmurs, rubs, or gallops.  Respiratory: Clear to auscultation bilaterally. Dullness at the bases GI: Soft, nontender, non-distended  MS: 2+ pitting lower extremity edema edema; No deformity. Neuro:  Nonfocal  Psych: Normal affect   Labs    High Sensitivity Troponin:   Recent Labs  Lab  05/16/21 1744 05/16/21 2028  TROPONINIHS 12 13      Chemistry Recent Labs  Lab 05/17/21 0630 05/18/21 0643 05/19/21 0500  NA 137 141 138  K 3.5 3.3* 3.4*  CL 95* 95* 95*  CO2 27 32 29  GLUCOSE 130* 127* 132*  BUN 28* 29* 34*  CREATININE 1.26* 1.41* 1.52*  CALCIUM 8.5* 9.3 9.0  GFRNONAA 45* 39* 36*  ANIONGAP 15 14 14      Hematology Recent Labs  Lab 05/16/21 1744 05/18/21 0643 05/19/21 0500  WBC 8.1 7.6 6.3  RBC 4.74 4.68 4.48  HGB 13.4 13.1 12.6  HCT 42.3 41.8 39.4  MCV 89.2 89.3 87.9  MCH 28.3 28.0 28.1  MCHC 31.7 31.3 32.0  RDW 16.3* 16.5* 16.3*  PLT 310 314 276    BNP Recent Labs  Lab 05/16/21 1740  BNP 122.6*     DDimer No results for input(s): DDIMER in the last 168 hours.   Radiology    ECHOCARDIOGRAM COMPLETE  Result Date: 05/17/2021    ECHOCARDIOGRAM REPORT   Patient Name:   Karen Farley Date of Exam: 05/17/2021 Medical Rec #:  [id number]          Height:       62.0 in Accession #:    [id number]  Weight:       228.0 lb Date of Birth:  [birth year]           BSA:          2.021 m Patient Age:    4 years           BP:           110/88 mmHg Patient Gender: F                  HR:           77 bpm. Exam Location:  [facility] Procedure: 2D Echo, Cardiac Doppler and Color Doppler Indications:     CHF-acute diastolic J33.54  History:         Patient has prior history of Echocardiogram examinations, most                  recent 12/01/2019. Risk Factors:Hypertension and Diabetes.  Sonographer:     Sherrie Sport RDCS (AE) Referring Phys:  [id number] [name] Diagnosing Phys: Ida Rogue MD  Sonographer Comments: Suboptimal apical window and suboptimal parasternal window. IMPRESSIONS  1. Left ventricular ejection fraction, by estimation, is 60 to 65%. The left ventricle has normal function. The left ventricle has no regional wall motion abnormalities. Left ventricular diastolic parameters are indeterminate.  2. Right ventricular systolic function is normal. The  right ventricular size is normal. Tricuspid regurgitation signal is inadequate for assessing PA pressure.  3. Left atrial size was mild to moderately dilated.  4. Left pleural effusion noted FINDINGS  Left Ventricle: Left ventricular ejection fraction, by estimation, is 60 to 65%. The left ventricle has normal function. The left ventricle has no regional wall motion abnormalities. The left ventricular internal cavity size was normal in size. There is  no left ventricular hypertrophy. Left ventricular diastolic parameters are indeterminate. Right Ventricle: The right ventricular size is normal. No increase in right ventricular wall thickness. Right ventricular systolic function is normal. Tricuspid regurgitation signal is inadequate for assessing PA pressure. Left Atrium: Left atrial size was moderately dilated. Right Atrium: Right atrial size was normal in size. Pericardium: There is no evidence of pericardial effusion. Mitral Valve: The mitral valve is normal in structure. Mild mitral annular calcification. No evidence of mitral valve regurgitation. No evidence of mitral valve stenosis. Tricuspid Valve: The tricuspid valve is normal in structure. Tricuspid valve regurgitation is mild . No evidence of tricuspid stenosis. Aortic Valve: The aortic valve is normal in structure. Aortic valve regurgitation is not visualized. No aortic stenosis is present. Aortic valve mean gradient measures 9.3 mmHg. Aortic valve peak gradient measures 16.4 mmHg. Aortic valve area, by VTI measures 1.26 cm. Pulmonic Valve: The pulmonic valve was normal in structure. Pulmonic valve regurgitation is not visualized. No evidence of pulmonic stenosis. Aorta: The aortic root is normal in size and structure. Venous: The inferior vena cava is normal in size with greater than 50% respiratory variability, suggesting right atrial pressure of 3 mmHg. IAS/Shunts: No atrial level shunt detected by color flow Doppler.  LEFT VENTRICLE PLAX 2D LVIDd:          3.83 cm LVIDs:         2.37 cm LV PW:         1.15 cm LV IVS:        0.95 cm LVOT diam:     2.00 cm LV SV:         49 LV SV Index:   24 LVOT Area:  3.14 cm  RIGHT VENTRICLE RV Basal diam:  2.71 cm LEFT ATRIUM              Index       RIGHT ATRIUM           Index LA diam:        5.40 cm  2.67 cm/m  RA Area:     13.20 cm LA Vol (A2C):   95.6 ml  47.30 ml/m RA Volume:   30.30 ml  14.99 ml/m LA Vol (A4C):   94.6 ml  46.80 ml/m LA Biplane Vol: 102.0 ml 50.47 ml/m  AORTIC VALVE                    PULMONIC VALVE AV Area (Vmax):    1.24 cm     PV Vmax:        0.60 m/s AV Area (Vmean):   1.24 cm     PV Peak grad:   1.4 mmHg AV Area (VTI):     1.26 cm     RVOT Peak grad: 2 mmHg AV Vmax:           202.33 cm/s AV Vmean:          143.000 cm/s AV VTI:            0.386 m AV Peak Grad:      16.4 mmHg AV Mean Grad:      9.3 mmHg LVOT Vmax:         79.60 cm/s LVOT Vmean:        56.600 cm/s LVOT VTI:          0.155 m LVOT/AV VTI ratio: 0.40  AORTA Ao Root diam: 2.60 cm MITRAL VALVE                TRICUSPID VALVE MV Area (PHT): 3.97 cm     TR Peak grad:   17.0 mmHg MV Decel Time: 191 msec     TR Vmax:        206.00 cm/s MV E velocity: 132.00 cm/s                             SHUNTS                             Systemic VTI:  0.16 m                             Systemic Diam: 2.00 cm Ida Rogue MD Electronically signed by Ida Rogue MD Signature Date/Time: 05/17/2021/1:55:11 PM    Final     Cardiac Studies     Patient Profile      [name] is a 75 year old woman with morbid obesity, lymphedema, permanent atrial fibrillation, chronic diastolic CHF, chronic kidney disease stage III, presenting with increasing leg swelling, shortness of breath, abdominal distention  Assessment & Plan   Acute on chronic diastolic CHF Exacerbated by morbid obesity and lymphedema Has been taking torsemide 20 mg every other day presenting with large left pleural effusion, abdominal distention, worsening leg  swelling Echocardiogram reviewed personally by myself showing normal LV function ejection fraction greater than 55% Continue IV Lasix twice daily Hands and outs not measured, she has been going to regular bathroom and flushing.  Discussed that she should try to track outs -Again hope to avoid  thoracentesis if adequate diuresis obtained though not out of the question if pleural effusion remains large after cessation of IV Lasix -consider referral to [facility] for placement of CardioMEMS to help guide diuresis   Permanent atrial fibrillation Continue beta-blocker, Eliquis   Left pleural effusion 3.5 cm noted on echo,  More impressive on x-ray  Will monitor with diuresis   Stage III chronic kidney disease Followed by nephrology Close monitoring of renal function with aggressive diuresis Slight worsening of renal function, continue IV Lasix 1 more day  close observation BMP tomorrow   Morbid obesity Debilitated, needs to meet with nutritional services for calorie restriction Very sedentary at baseline  Lymphedema Recommend husband bring in lymphedema compression pumps from home, would use 1 hour twice daily For now we will place thigh-high compression hose, if unable to tolerate will need Ace wraps to mobilize fluid   Total encounter time more than 35 minutes  Greater than 50% was spent in counseling and coordination of care with the patient   For questions or updates, please contact [place] Please consult [email or web address] for contact info under        Signed, Ida Rogue, MD  05/19/2021, 9:47 AM

## 2021-05-19 NOTE — Progress Notes (Signed)
[place] [facility] [place] NAME: Karen Farley    MR#:  [id number]  DATE OF BIRTH:  26-Feb-1946  SUBJECTIVE:   patient sitting out in the chair. Husband at bedside. Appears clinically stable however she gets short winded with exertion like just walking to the bathroom. Currently not on oxygen.  REVIEW OF SYSTEMS:   Review of Systems  Constitutional:  Negative for chills, fever and weight loss.  HENT:  Negative for ear discharge, ear pain and nosebleeds.   Eyes:  Negative for blurred vision, pain and discharge.  Respiratory:  Positive for shortness of breath. Negative for sputum production, wheezing and stridor.   Cardiovascular:  Positive for leg swelling. Negative for chest pain, palpitations, orthopnea and PND.  Gastrointestinal:  Negative for abdominal pain, diarrhea, nausea and vomiting.  Genitourinary:  Negative for frequency and urgency.  Musculoskeletal:  Negative for back pain and joint pain.  Neurological:  Positive for weakness. Negative for sensory change, speech change and focal weakness.  Psychiatric/Behavioral:  Negative for depression and hallucinations. The patient is not nervous/anxious.   Tolerating Diet:yes Tolerating PT:   DRUG ALLERGIES:  No Known Allergies  VITALS:  Blood pressure (!) 115/51, pulse 92, temperature 97.7 F (36.5 C), temperature source Oral, resp. rate 17, height 5\' 2"  (1.575 m), weight 102.5 kg, SpO2 97 %.  PHYSICAL EXAMINATION:   Physical Exam 75 year old obese female sitting in chair, calm, NAD obese Neck supple, no JVD Lungs clear to auscultation no wheezing decreased BS bases Cardiovascular: Irregularly irregular heart sounds.  1/6 systolic ejection murmur at the sternal border. Abdomen soft benign, increase girth Extremities 2-3+ lower extremity edema bilaterally, chronic lymphedema Neuro Alert alert oriented x4 grossly intact Skin: No rash or lesion    LABORATORY PANEL:  CBC Recent Labs   Lab 05/19/21 0500  WBC 6.3  HGB 12.6  HCT 39.4  PLT 276    Chemistries  Recent Labs  Lab 05/19/21 0500  NA 138  K 3.4*  CL 95*  CO2 29  GLUCOSE 132*  BUN 34*  CREATININE 1.52*  CALCIUM 9.0   Cardiac Enzymes No results for input(s): TROPONINI in the last 168 hours. RADIOLOGY:  No results found. ASSESSMENT AND PLAN:  Karen Farley is a 75 y.o. female with medical history significant for HFpEF (EF 60-65% on echo 11/2019), atrial fibrillation on Eliquis, essential hypertension, hyperlipidemia, chronic lymphedema, chronic venous insufficiency, PAD, type 2 diabetes, CKD stage IIIb is admitted for heart failure exacerbation.  She was recently hospitalized in March 22, 2001 2 in the setting of hypotension and acute kidney injury requiring reduction in diuretic dose of torsemide 20 mg 3 times a week.  She continued to get more lower extremity swelling, weight gain and increase in abdominal girth.  Anasarca Acute on chronic diastolic CHF  Left Pleural effusion - Patient with fluid retention not responding to outpatient increase in torsemide, - History of intolerance to diuretic intensification: presyncope/hypotension with AKI - CXR with left pleural effusion -moderate - Continue IV Lasix 40 mg twice daily with close BP monitoring and monitoring of renal function -Her last recorded weight at cardiology office on July 4 was 234 pounds.  Her weight this morning is recorded as 227 pounds - Repeat echo this admission shows EF of 60 to 65% - Cardiology Dr Rockey Situ --Repeat cxr in am  to see if pleural effusion requires thoracentesis   Permanent atrial fibrillation (HCC)   Chronic anticoagulation - Continue Toprol 25 mg  p.o. daily for rate control - Continue Eliquis(was recently switched from Xarelto due to worsening renal function). CHA2DS2-VASc score of 5     Type 2 diabetes mellitus (HCC) - Sliding scale insulin coverage while in the hospital --Her last hemoglobin A1c in March was  7.3     Stage 3b chronic kidney disease ([place]) - Monitor renal function in view of recent AKI to creatinine over 4 - Consider nephrology consult if worsening     Obesity, Class III, BMI 40-49.9 (morbid obesity) ([place]) - Complicating factor to overall prognosis and care     DVT prophylaxis: Eliquis Code Status: Full Family Communication: Updated Husband [name] at bedside Disposition Plan: Diuresis for now, may be another 1-2 days before D/C plans per Dr Rockey Situ Status is: Inpatient   Remains inpatient appropriate because:IV treatments appropriate due to intensity of illness or inability to take PO and Inpatient level of care appropriate due to severity of illness   Dispo: The patient is from: Home              Anticipated d/c is to: Home              Patient currently is not medically stable to d/c.              Difficult to place patient No           TOTAL TIME TAKING CARE OF THIS PATIENT: 30 minutes.  >50% time spent on counselling and coordination of care  Note: This dictation was prepared with Dragon dictation along with smaller phrase technology. Any transcriptional errors that result from this process are unintentional.  Fritzi Mandes M.D    [facility]   CC: Primary care physician; Valerie Roys, DO Patient ID: Karen Farley, female   DOB: 16-Feb-1946, 75 y.o.   MRN: [id number]

## 2021-05-19 NOTE — Progress Notes (Signed)
Patient refused to apply [facility]. Explained the importance and that we can measure to get the appropriate size, patient still does not want to wear. Husband is to bring in home compression devices. Will continue to monitor

## 2021-05-20 ENCOUNTER — Inpatient Hospital Stay: Payer: Medicare PPO

## 2021-05-20 DIAGNOSIS — E8770 Fluid overload, unspecified: Secondary | ICD-10-CM

## 2021-05-20 DIAGNOSIS — I5033 Acute on chronic diastolic (congestive) heart failure: Secondary | ICD-10-CM | POA: Diagnosis not present

## 2021-05-20 DIAGNOSIS — J90 Pleural effusion, not elsewhere classified: Secondary | ICD-10-CM | POA: Diagnosis not present

## 2021-05-20 DIAGNOSIS — R601 Generalized edema: Secondary | ICD-10-CM

## 2021-05-20 DIAGNOSIS — I739 Peripheral vascular disease, unspecified: Secondary | ICD-10-CM | POA: Diagnosis not present

## 2021-05-20 DIAGNOSIS — I4821 Permanent atrial fibrillation: Secondary | ICD-10-CM | POA: Diagnosis not present

## 2021-05-20 LAB — BASIC METABOLIC PANEL
Anion gap: 9 (ref 5–15)
BUN: 32 mg/dL — ABNORMAL HIGH (ref 8–23)
CO2: 31 mmol/L (ref 22–32)
Calcium: 9.5 mg/dL (ref 8.9–10.3)
Chloride: 98 mmol/L (ref 98–111)
Creatinine, Ser: 1.56 mg/dL — ABNORMAL HIGH (ref 0.44–1.00)
GFR, Estimated: 34 mL/min — ABNORMAL LOW (ref >60)
Glucose, Bld: 188 mg/dL — ABNORMAL HIGH (ref 70–99)
Potassium: 4 mmol/L (ref 3.5–5.1)
Sodium: 138 mmol/L (ref 135–145)

## 2021-05-20 LAB — GLUCOSE, CAPILLARY
Glucose-Capillary: 122 mg/dL — ABNORMAL HIGH (ref 70–99)
Glucose-Capillary: 169 mg/dL — ABNORMAL HIGH (ref 70–99)
Glucose-Capillary: 133 mg/dL — ABNORMAL HIGH (ref 70–99)
Glucose-Capillary: 179 mg/dL — ABNORMAL HIGH (ref 70–99)

## 2021-05-20 NOTE — Progress Notes (Signed)
Progress Note  Patient Name: Karen Farley Date of Encounter: 05/20/2021  Primary Cardiologist: Kate Sable, MD  Subjective   Noted good urine output last night but feels a little more short of breath this morning.  Also feels that pedal edema has worsened.  She did wear compression pump last night.  Inpatient Medications    Scheduled Meds:  cholecalciferol  2,000 Units Oral Daily   furosemide  40 mg Intravenous Q12H   insulin aspart  0-15 Units Subcutaneous TID WC   insulin aspart  0-5 Units Subcutaneous QHS   latanoprost  1 drop Both Eyes QHS   metoprolol succinate  25 mg Oral Daily   multivitamin with minerals  1 tablet Oral Daily   potassium chloride  20 mEq Oral Daily   Continuous Infusions:  PRN Meds: acetaminophen **OR** acetaminophen, ondansetron **OR** ondansetron (ZOFRAN) IV   Vital Signs    Vitals:   05/20/21 0457 05/20/21 0500 05/20/21 0522 05/20/21 0748  BP: (!) 137/115  (!) 98/58 107/64  Pulse: 91  92 73  Resp: 15   18  Temp: 98 F (36.7 C)  98.3 F (36.8 C) 98.5 F (36.9 C)  TempSrc:      SpO2: 91%  91% 99%  Weight:  102.4 kg    Height:        Intake/Output Summary (Last 24 hours) at 05/20/2021 1048 Last data filed at 05/20/2021 0940 Gross per 24 hour  Intake 1220 ml  Output 800 ml  Net 420 ml   Filed Weights   05/18/21 0332 05/19/21 0500 05/20/21 0500  Weight: 102.9 kg 102.5 kg 102.4 kg    Physical Exam   GEN: Obese, in no acute distress.  HEENT: Grossly normal.  Neck: Supple, obese, difficult to gauge JVP.  No carotid bruits, or masses. Cardiac: Irregularly irregular, 2 out of 6 systolic murmur at the upper sternal borders, no rubs, or gallops. No clubbing, cyanosis.  2-3+ bilateral lower extremity and pedal edema.  Radials 2+, DP/PT 1+ and equal bilaterally.  Respiratory:  Respirations regular and unlabored, significantly diminished breath sounds at the left base. GI: Obese, somewhat firm.  Nontender, nondistended, BS + x  4. MS: no deformity or atrophy. Skin: warm and dry, no rash. Neuro:  Strength and sensation are intact. Psych: AAOx3.  Normal affect.  Labs    Chemistry Recent Labs  Lab 05/17/21 0630 05/18/21 0643 05/19/21 0500  NA 137 141 138  K 3.5 3.3* 3.4*  CL 95* 95* 95*  CO2 27 32 29  GLUCOSE 130* 127* 132*  BUN 28* 29* 34*  CREATININE 1.26* 1.41* 1.52*  CALCIUM 8.5* 9.3 9.0  GFRNONAA 45* 39* 36*  ANIONGAP 15 14 14      Hematology Recent Labs  Lab 05/16/21 1744 05/18/21 0643 05/19/21 0500  WBC 8.1 7.6 6.3  RBC 4.74 4.68 4.48  HGB 13.4 13.1 12.6  HCT 42.3 41.8 39.4  MCV 89.2 89.3 87.9  MCH 28.3 28.0 28.1  MCHC 31.7 31.3 32.0  RDW 16.3* 16.5* 16.3*  PLT 310 314 276    Cardiac Enzymes  Recent Labs  Lab 05/16/21 1744 05/16/21 2028  TROPONINIHS 12 13      BNP Recent Labs  Lab 05/16/21 1740  BNP 122.6*     Lipids  Lab Results  Component Value Date   CHOL 210 (H) 01/15/2021   HDL 49 01/15/2021   LDLCALC 139 (H) 01/15/2021   TRIG 120 01/15/2021    HbA1c  Lab Results  Component  Value Date   HGBA1C 6.6 (H) 05/17/2021    Radiology    DG Chest 2 View  Result Date: 05/20/2021 CLINICAL DATA:  SOB, weakness, dry cough, leg swelling x "weeks". Hx of PAD, CHF, Afib. EXAM: CHEST - 2 VIEW COMPARISON:  05/16/2021 FINDINGS: Persistent left lower lung consolidation with possible effusion. Patchy perihilar airspace opacities slightly increased on the right. Heart size difficult to assess due to adjacent opacities. No pneumothorax. Visualized bones unremarkable. IMPRESSION: Slightly worse airspace disease, left greater than right, with possible effusion Electronically Signed   By: Lucrezia Europe M.D.   On: 05/20/2021 07:00   DG Chest 2 View  Result Date: 05/16/2021 CLINICAL DATA:  Shortness of breath and lower extremity swelling. EXAM: CHEST - 2 VIEW COMPARISON:  April 10, 2021 FINDINGS: There is opacification of the mid and lower left lung. This is increased in severity when  compared to the prior study. A left pleural effusion is also noted. The right lung is clear. No pneumothorax is seen. The cardiac silhouette is enlarged and unchanged in size. The visualized skeletal structures are unremarkable. IMPRESSION: 1. Worsening left basilar atelectasis and/or infiltrate with associated left-sided pleural effusion. Electronically Signed   By: Virgina Norfolk M.D.   On: 05/16/2021 19:06   Telemetry    Atrial fibrillation, and 80s to 90s- Personally Reviewed  Cardiac Studies    2D Echocardiogram 7.28.2022    1. Left ventricular ejection fraction, by estimation, is 60 to 65%. The  left ventricle has normal function. The left ventricle has no regional  wall motion abnormalities. Left ventricular diastolic parameters are  indeterminate.   2. Right ventricular systolic function is normal. The right ventricular  size is normal. Tricuspid regurgitation signal is inadequate for assessing  PA pressure.   3. Left atrial size was mild to moderately dilated.   4. Left pleural effusion noted   Patient Profile     Karen Farley is a 75 y.o. female with a history of permanent atrial fibrillation, HFpEF, stage III chronic kidney disease, hypertension, obesity, lymphedema and hyperlipidemia, who was admitted 7/28 w/ acute on chronic hfpef and mod L pleural effusion.  Assessment & Plan    1.  Acute on chronic heart failure with preserved ejection fraction: Patient with a history of HFpEF with his recent hospitalization in June in the setting of hypotension and acute kidney injury with resultant reduction in diuretic dosing.  Admitted July 28 with progressive lower extremity edema, weight gain, increasing abdominal girth, and dyspnea.  Echo this admission with EF 60-65%.  I's and O's have been inaccurate, as she has not always been saving her urine.  She reports good urine output last night though no urine output was recorded on second shift.  Weight this morning via bed scale  is relatively unchanged compared to yesterday.  She feels more short of breath this morning.  She continues to have 2-3+ bilateral lower extremity edema despite using her lymphedema pumps last night.  Labs are pending this morning.  If stable, would continue intravenous diuresis and may need to consider metolazone.  Heart rates and blood pressure stable on beta-blocker therapy.  With chronic kidney disease, she is a poor candidate for SGLT2 inhibitor.  2.  Left pleural effusion: Chest x-ray this morning with slightly worse airspace disease.  Discussed with Dr. Posey Pronto.  Eliquis on hold with plan for thoracentesis tomorrow.  Discussed in detail with patient today and she is agreeable to proceed.  3.  Essential hypertension: Stable.  4.  Permanent atrial fibrillation: Rate controlled on beta-blocker.  Eliquis to be held today in preparation for thoracentesis tomorrow.  5.  Hypokalemia: Labs pending this morning.  6.  Lower extremity lymphedema: She has lymphedema pumps at bedside and will use while hospitalized.  As previously noted, lower extremity edema myself is likely poor indicator overall overall volume as evidenced by minimal elevated BNP on admission.  7.  Stage III chronic kidney disease: Followed closely by nephrology as an outpatient.  Basic metabolic panel is pending this morning.  Was previously on torsemide 20 mg every other day at home and as previously noted, will likely require daily dose.  8.  Type 2 diabetes mellitus: A1c 6.6.  Insulin management per medicine team.  Poor candidate for SGLT2 inhibitor in the setting of chronic kidney disease.  Signed, Murray Hodgkins, NP  05/20/2021, 10:48 AM    For questions or updates, please contact   Please consult [email or web address] for contact info under Cardiology/STEMI.

## 2021-05-20 NOTE — Progress Notes (Signed)
Karen Farley at [place] NAME: Karen Farley    MR#:  [id number]  DATE OF BIRTH:  09/19/1946  SUBJECTIVE:   patient sitting out in the chair. Husband at bedside. Appears clinically stable however she gets short winded with exertion like just walking to the bathroom. Currently not on oxygen.  Legs more puffy  REVIEW OF SYSTEMS:   Review of Systems  Constitutional:  Negative for chills, fever and weight loss.  HENT:  Negative for ear discharge, ear pain and nosebleeds.   Eyes:  Negative for blurred vision, pain and discharge.  Respiratory:  Positive for shortness of breath. Negative for sputum production, wheezing and stridor.   Cardiovascular:  Positive for leg swelling. Negative for chest pain, palpitations, orthopnea and PND.  Gastrointestinal:  Negative for abdominal pain, diarrhea, nausea and vomiting.  Genitourinary:  Negative for frequency and urgency.  Musculoskeletal:  Negative for back pain and joint pain.  Neurological:  Positive for weakness. Negative for sensory change, speech change and focal weakness.  Psychiatric/Behavioral:  Negative for depression and hallucinations. The patient is not nervous/anxious.   Tolerating Diet:yes Tolerating PT:   DRUG ALLERGIES:  No Known Allergies  VITALS:  Blood pressure 107/64, pulse 73, temperature 98.5 F (36.9 C), resp. rate 18, height 5\' 2"  (1.575 m), weight 102.4 kg, SpO2 99 %.  PHYSICAL EXAMINATION:   Physical Exam 75 year old obese female sitting in chair, calm, NAD obese Neck supple, no JVD Lungs clear to auscultation no wheezing decreased BS bases Cardiovascular: Irregularly irregular heart sounds.  1/6 systolic ejection murmur at the sternal border. Abdomen soft benign, increase girth Extremities 2-3+ lower extremity edema bilaterally, chronic lymphedema Neuro Alert alert oriented x4 grossly intact Skin: No rash or lesion    LABORATORY PANEL:  CBC Recent Labs  Lab  05/19/21 0500  WBC 6.3  HGB 12.6  HCT 39.4  PLT 276     Chemistries  Recent Labs  Lab 05/20/21 1004  NA 138  K 4.0  CL 98  CO2 31  GLUCOSE 188*  BUN 32*  CREATININE 1.56*  CALCIUM 9.5    Cardiac Enzymes No results for input(s): TROPONINI in the last 168 hours. RADIOLOGY:  DG Chest 2 View  Result Date: 05/20/2021 CLINICAL DATA:  SOB, weakness, dry cough, leg swelling x "weeks". Hx of PAD, CHF, Afib. EXAM: CHEST - 2 VIEW COMPARISON:  05/16/2021 FINDINGS: Persistent left lower lung consolidation with possible effusion. Patchy perihilar airspace opacities slightly increased on the right. Heart size difficult to assess due to adjacent opacities. No pneumothorax. Visualized bones unremarkable. IMPRESSION: Slightly worse airspace disease, left greater than right, with possible effusion Electronically Signed   By: Lucrezia Europe M.D.   On: 05/20/2021 07:00   ASSESSMENT AND PLAN:  Karen Farley is a 75 y.o. female with medical history significant for HFpEF (EF 60-65% on echo 11/2019), atrial fibrillation on Eliquis, essential hypertension, hyperlipidemia, chronic lymphedema, chronic venous insufficiency, PAD, type 2 diabetes, CKD stage IIIb is admitted for heart failure exacerbation.  She was recently hospitalized in March 22, 2001 2 in the setting of hypotension and acute kidney injury requiring reduction in diuretic dose of torsemide 20 mg 3 times a week.  She continued to get more lower extremity swelling, weight gain and increase in abdominal girth.  Anasarca Acute on chronic diastolic CHF  Left Pleural effusion - Patient with fluid retention not responding to outpatient increase in torsemide, - CXRon admisison with left pleural effusion -moderate -  Continue IV Lasix 40 mg twice daily with close BP monitoring and monitoring of renal function -Her last recorded weight at cardiology office on July 4 was 234 pounds.  Her weight this morning is recorded as 227 pounds - Repeat echo this  admission shows EF of 60 to 65% - Cardiology Dr Rockey Situ --Repeat cxr in am  to see if pleural effusion requires thoracentesis --7/31-- repeat chest x-ray does not show any improvement in the left sided haziness. Orders placed very ultrasound-guided thoracentesis. Patient is in agreement. Will hold eliquis.   Permanent atrial fibrillation (HCC)   Chronic anticoagulation - Continue Toprol 25 mg p.o. daily for rate control - Continue Eliquis(was recently switched from Xarelto due to worsening renal function). CHA2DS2-VASc score of 5-hold for thoracentesis     Type 2 diabetes mellitus ([place]) - Sliding scale insulin coverage while in the hospital --Her last hemoglobin A1c in March was 7.3     Stage 3b chronic kidney disease ([place]) - Monitor renal function in view of recent AKI to creatinine over 4 - Consider nephrology consult if worsening     Obesity, Class III, BMI 40-49.9 (morbid obesity) ([place]) - Complicating factor to overall prognosis and care     DVT prophylaxis: Eliquis Code Status: Full Family Communication: Updated Husband Karen Farley at bedside Disposition Plan: Diuresis for now, may be another 1-2 days before D/C per Dr Rockey Situ Status is: Inpatient   Remains inpatient appropriate because:IV treatments appropriate due to intensity of illness or inability to take PO and Inpatient level of care appropriate due to severity of illness   Dispo: The patient is from: Home              Anticipated d/c is to: Home              Patient currently is not medically stable to d/c.              Difficult to place patient No           TOTAL TIME TAKING CARE OF THIS PATIENT: 25 minutes.  >50% time spent on counselling and coordination of care  Note: This dictation was prepared with Dragon dictation along with smaller phrase technology. Any transcriptional errors that result from this process are unintentional.  Fritzi Mandes M.D    [facility]   CC: Primary care physician;  Valerie Roys, DO Patient ID: Karen Farley, female   DOB: February 13, 1946, 75 y.o.   MRN: [id number]

## 2021-05-21 ENCOUNTER — Inpatient Hospital Stay: Payer: Medicare PPO

## 2021-05-21 DIAGNOSIS — R601 Generalized edema: Secondary | ICD-10-CM | POA: Diagnosis not present

## 2021-05-21 DIAGNOSIS — I5033 Acute on chronic diastolic (congestive) heart failure: Secondary | ICD-10-CM

## 2021-05-21 DIAGNOSIS — I4821 Permanent atrial fibrillation: Secondary | ICD-10-CM

## 2021-05-21 DIAGNOSIS — J90 Pleural effusion, not elsewhere classified: Secondary | ICD-10-CM | POA: Diagnosis not present

## 2021-05-21 LAB — BODY FLUID CELL COUNT WITH DIFFERENTIAL
Eos, Fluid: 0 %
Lymphs, Fluid: 38 %
Monocyte-Macrophage-Serous Fluid: 42 %
Neutrophil Count, Fluid: 20 %
Total Nucleated Cell Count, Fluid: 176 cu mm

## 2021-05-21 LAB — BASIC METABOLIC PANEL
Anion gap: 12 (ref 5–15)
BUN: 33 mg/dL — ABNORMAL HIGH (ref 8–23)
CO2: 32 mmol/L (ref 22–32)
Calcium: 9.3 mg/dL (ref 8.9–10.3)
Chloride: 97 mmol/L — ABNORMAL LOW (ref 98–111)
Creatinine, Ser: 1.4 mg/dL — ABNORMAL HIGH (ref 0.44–1.00)
GFR, Estimated: 39 mL/min — ABNORMAL LOW (ref >60)
Glucose, Bld: 137 mg/dL — ABNORMAL HIGH (ref 70–99)
Potassium: 3.4 mmol/L — ABNORMAL LOW (ref 3.5–5.1)
Sodium: 141 mmol/L (ref 135–145)

## 2021-05-21 LAB — GLUCOSE, CAPILLARY
Glucose-Capillary: 132 mg/dL — ABNORMAL HIGH (ref 70–99)
Glucose-Capillary: 137 mg/dL — ABNORMAL HIGH (ref 70–99)
Glucose-Capillary: 148 mg/dL — ABNORMAL HIGH (ref 70–99)
Glucose-Capillary: 124 mg/dL — ABNORMAL HIGH (ref 70–99)

## 2021-05-21 LAB — PROTEIN, PLEURAL OR PERITONEAL FLUID: Total protein, fluid: <3 g/dL

## 2021-05-21 LAB — PATHOLOGIST SMEAR REVIEW

## 2021-05-21 LAB — LACTATE DEHYDROGENASE, PLEURAL OR PERITONEAL FLUID: LD, Fluid: 45 U/L — ABNORMAL HIGH (ref 3–23)

## 2021-05-21 LAB — GLUCOSE, PLEURAL OR PERITONEAL FLUID: Glucose, Fluid: 164 mg/dL

## 2021-05-21 MED ORDER — POTASSIUM CHLORIDE CRYS ER 20 MEQ PO TBCR
40.0000 meq | EXTENDED_RELEASE_TABLET | Freq: Once | ORAL | Status: AC
  Administered 2021-05-21: 40 meq via ORAL
  Filled 2021-05-21: qty 2

## 2021-05-21 MED ORDER — APIXABAN 5 MG PO TABS
5.0000 mg | ORAL_TABLET | Freq: Two times a day (BID) | ORAL | Status: DC
  Administered 2021-05-21 – 2021-05-27 (×12): 5 mg via ORAL
  Filled 2021-05-21 (×12): qty 1

## 2021-05-21 MED ORDER — FUROSEMIDE 10 MG/ML IJ SOLN
80.0000 mg | Freq: Two times a day (BID) | INTRAMUSCULAR | Status: DC
  Administered 2021-05-21 – 2021-05-22 (×2): 80 mg via INTRAVENOUS
  Filled 2021-05-21 (×2): qty 8

## 2021-05-21 NOTE — Progress Notes (Signed)
Progress Note  Patient Name: Karen Farley Date of Encounter: 05/21/2021  Primary Cardiologist: Kate Sable, MD   Subjective   No chest pain.  S/p left sided thoracentesis 05/21/21 of left pleural effusion.   400 cc of clear yellow fluid removed.    Dry cough since the procedure.   Breathing status still not at baseline, though improved s/p procedure.  Able to walk to the restroom and back without any chest pain or dizziness.  States LEE worse in her feet/toes, new abdominal tightness.  Lymphedema pumps present, though she is waiting to put these on until after cardiology has completely rounded on her.  No tachypalpitations.   Inpatient Medications    Scheduled Meds:  cholecalciferol  2,000 Units Oral Daily   furosemide  40 mg Intravenous Q12H   insulin aspart  0-15 Units Subcutaneous TID WC   insulin aspart  0-5 Units Subcutaneous QHS   latanoprost  1 drop Both Eyes QHS   metoprolol succinate  25 mg Oral Daily   multivitamin with minerals  1 tablet Oral Daily   potassium chloride  20 mEq Oral Daily   Continuous Infusions:  PRN Meds: acetaminophen **OR** acetaminophen, ondansetron **OR** ondansetron (ZOFRAN) IV   Vital Signs    Vitals:   05/20/21 2027 05/21/21 0400 05/21/21 0515 05/21/21 0753  BP: 107/70 (!) 93/58  118/62  Pulse: 93 88  78  Resp: 18 16  19   Temp: 98.5 F (36.9 C) 98.4 F (36.9 C)  97.6 F (36.4 C)  TempSrc: Oral Oral    SpO2: 97% 92%  97%  Weight:   106.6 kg   Height:        Intake/Output Summary (Last 24 hours) at 05/21/2021 0943 Last data filed at 05/21/2021 0402 Gross per 24 hour  Intake 360 ml  Output 3525 ml  Net -3165 ml   Last 3 Weights 05/21/2021 05/20/2021 05/19/2021  Weight (lbs) 235 lb 225 lb 12 oz 225 lb 15.5 oz  Weight (kg) 106.595 kg 102.4 kg 102.5 kg      Telemetry    Atrial fibrillation with ventricular rate 70s to 90s- Personally Reviewed  ECG    No new tracings- Personally Reviewed  Physical Exam    GEN: No acute distress.  Seated in chair next to window.  Her husband joins her today. Neck: Unable to assess JVD due to body habitus, position of patient Cardiac: IRIR with ventricular rate controlled, 2/6 RUSB murmur, rubs, or gallops.  Respiratory: Reduced L sided basilar breath sounds. GI: Firm, distended, not TTP  MS: Bilateral 1-2+ LEE, erythema / skin changes noted consistent with lymphedema; No deformity. Neuro:  Nonfocal  Psych: Normal affect   Labs    High Sensitivity Troponin:   Recent Labs  Lab 05/16/21 1744 05/16/21 2028  TROPONINIHS 12 13      Chemistry Recent Labs  Lab 05/19/21 0500 05/20/21 1004 05/21/21 0512  NA 138 138 141  K 3.4* 4.0 3.4*  CL 95* 98 97*  CO2 29 31 32  GLUCOSE 132* 188* 137*  BUN 34* 32* 33*  CREATININE 1.52* 1.56* 1.40*  CALCIUM 9.0 9.5 9.3  GFRNONAA 36* 34* 39*  ANIONGAP 14 9 12      Hematology Recent Labs  Lab 05/16/21 1744 05/18/21 0643 05/19/21 0500  WBC 8.1 7.6 6.3  RBC 4.74 4.68 4.48  HGB 13.4 13.1 12.6  HCT 42.3 41.8 39.4  MCV 89.2 89.3 87.9  MCH 28.3 28.0 28.1  MCHC 31.7 31.3 32.0  RDW 16.3* 16.5* 16.3*  PLT 310 314 276    BNP Recent Labs  Lab 05/16/21 1740  BNP 122.6*     DDimer No results for input(s): DDIMER in the last 168 hours.   Radiology    DG Chest 2 View  Result Date: 05/20/2021 CLINICAL DATA:  SOB, weakness, dry cough, leg swelling x "weeks". Hx of PAD, CHF, Afib. EXAM: CHEST - 2 VIEW COMPARISON:  05/16/2021 FINDINGS: Persistent left lower lung consolidation with possible effusion. Patchy perihilar airspace opacities slightly increased on the right. Heart size difficult to assess due to adjacent opacities. No pneumothorax. Visualized bones unremarkable. IMPRESSION: Slightly worse airspace disease, left greater than right, with possible effusion Electronically Signed   By: Lucrezia Europe M.D.   On: 05/20/2021 07:00    Cardiac Studies   2D Echocardiogram 7.28.2022  1. Left ventricular ejection  fraction, by estimation, is 60 to 65%. The  left ventricle has normal function. The left ventricle has no regional  wall motion abnormalities. Left ventricular diastolic parameters are  indeterminate.   2. Right ventricular systolic function is normal. The right ventricular  size is normal. Tricuspid regurgitation signal is inadequate for assessing  PA pressure.   3. Left atrial size was mild to moderately dilated.   4. Left pleural effusion noted   Patient Profile     75 y.o. female with a history of permanent atrial fibrillation, HFpEF, stage III chronic kidney disease, hypertension, obesity, lymphedema and hyperlipidemia, who was admitted 7/28 w/ acute on chronic hfpef and mod L pleural effusion.  Assessment & Plan    Acute on chronic heart failure with preserved ejection fraction --Reports improved but ongoing shortness of breath.  EF 60 to 65%. S/p L lung thoracentesis with some improvement in her breathing.  Reports ongoing lower extremity edema and abdominal distention.  She brought her lymphedema pumps from home -lymphedema likely contributing to lower extremity edema. --Continue IV Lasix 40 mg every 12 hours. -- Continue to monitor I/os, daily standing weights.  She is net -2.8L and -3.2L yesterday. Wt 225.8lbs  235lbs.  --Cr 1.56  1.4 with BUN 32  33 --K+ repletion ordered today, given hypokalemia as below. --Continue current beta-blocker.  Poor candidate for SGLT2 inhibitor given renal function. Not previously on ACE/ARB given renal function.   Left pleural effusion --S/P left lung thoracentesis.  Restart anticoagulation as soon as possible once safe to do so from a bleeding standpoint.  Permanent atrial fibrillation --Asymptomatic in atrial fibrillation with rates well controlled.  Continue current beta-blocker.  Eliquis held for thoracentesis today with restart as soon as safe to do so from a bleeding standpoint. No plan for DCCV this admission and given interruption in [facility].  Also, per pt, DCCV discussed in past and deferred per pt preference. Consider taht echo shows LA mild to moderately dilated and elevated BMI could influence ability to hold NSR if DCCV in the future.  Hypokalemia --Ordered KCl tab 40 M EQ.  Replete with goal 4.0.  Replete electrolytes to avoid recurrent arrhythmia.  Lower extremity lymphedema --She has brought her lymphedema pumps from home.  Continue as daily lymphedema pumps.   Stage III CKD --Continue to follow with nephrology.  Daily BMET.  Avoid nephrotoxins.  DM2 --A1c 6.6.  Continue SSI.  Per IM.  Given renal function, poor candidate for SGLT2 inhibitor.    For questions or updates, please contact [place] Please consult [email or web address] for contact info under        Signed,  Arvil Chaco, PA-C  05/21/2021, 9:43 AM

## 2021-05-21 NOTE — Procedures (Signed)
PROCEDURE SUMMARY:  Successful US guided left thoracentesis. Yielded 400 mL of clear yellow fluid. Pt tolerated procedure well. No immediate complications.  Specimen was sent for labs. CXR ordered.  EBL < 5 mL  Ascencion Dike PA-C 05/21/2021 10:12 AM

## 2021-05-21 NOTE — Progress Notes (Signed)
[place] at [place] NAME: Karen Farley    MR#:  [id number]  DATE OF BIRTH:  09/17/1946  SUBJECTIVE:   patient sitting out in the chair. Currently not on oxygen.  UOP 4L  REVIEW OF SYSTEMS:   Review of Systems  Constitutional:  Negative for chills, fever and weight loss.  HENT:  Negative for ear discharge, ear pain and nosebleeds.   Eyes:  Negative for blurred vision, pain and discharge.  Respiratory:  Positive for shortness of breath. Negative for sputum production, wheezing and stridor.   Cardiovascular:  Positive for leg swelling. Negative for chest pain, palpitations, orthopnea and PND.  Gastrointestinal:  Negative for abdominal pain, diarrhea, nausea and vomiting.  Genitourinary:  Negative for frequency and urgency.  Musculoskeletal:  Negative for back pain and joint pain.  Neurological:  Positive for weakness. Negative for sensory change, speech change and focal weakness.  Psychiatric/Behavioral:  Negative for depression and hallucinations. The patient is not nervous/anxious.   Tolerating Diet:yes Tolerating PT:   DRUG ALLERGIES:  No Known Allergies  VITALS:  Blood pressure 118/62, pulse 78, temperature 97.6 F (36.4 C), resp. rate 19, height 5\' 2"  (1.575 m), weight 106.6 kg, SpO2 97 %.  PHYSICAL EXAMINATION:   Physical Exam 75 year old obese female sitting in chair, calm, NAD obese Neck supple, no JVD Lungs clear to auscultation no wheezing decreased BS bases Cardiovascular: Irregularly irregular heart sounds.  1/6 systolic ejection murmur at the sternal border. Abdomen soft benign, increase girth Extremities 2-3+ lower extremity edema bilaterally, chronic lymphedema Neuro Alert alert oriented x4 grossly intact Skin: No rash or lesion    LABORATORY PANEL:  CBC Recent Labs  Lab 05/19/21 0500  WBC 6.3  HGB 12.6  HCT 39.4  PLT 276     Chemistries  Recent Labs  Lab 05/21/21 0512  NA 141  K 3.4*  CL 97*   CO2 32  GLUCOSE 137*  BUN 33*  CREATININE 1.40*  CALCIUM 9.3    Cardiac Enzymes No results for input(s): TROPONINI in the last 168 hours. RADIOLOGY:  DG Chest 2 View  Result Date: 05/20/2021 CLINICAL DATA:  SOB, weakness, dry cough, leg swelling x "weeks". Hx of PAD, CHF, Afib. EXAM: CHEST - 2 VIEW COMPARISON:  05/16/2021 FINDINGS: Persistent left lower lung consolidation with possible effusion. Patchy perihilar airspace opacities slightly increased on the right. Heart size difficult to assess due to adjacent opacities. No pneumothorax. Visualized bones unremarkable. IMPRESSION: Slightly worse airspace disease, left greater than right, with possible effusion Electronically Signed   By: Karen Farley M.D.   On: 05/20/2021 07:00   ASSESSMENT AND PLAN:  Karen Farley is a 75 y.o. female with medical history significant for HFpEF (EF 60-65% on echo 11/2019), atrial fibrillation on Eliquis, essential hypertension, hyperlipidemia, chronic lymphedema, chronic venous insufficiency, PAD, type 2 diabetes, CKD stage IIIb is admitted for heart failure exacerbation.  She was recently hospitalized in March 22, 2001 2 in the setting of hypotension and acute kidney injury requiring reduction in diuretic dose of torsemide 20 mg 3 times a week.  She continued to get more lower extremity swelling, weight gain and increase in abdominal girth.  Anasarca Acute on chronic diastolic CHF  Left Pleural effusion - Patient with fluid retention not responding to outpatient increase in torsemide, - CXRon admisison with left pleural effusion -moderate - Continue IV Lasix 40 mg twice daily with close BP monitoring and monitoring of renal function -Her last recorded  weight at cardiology office on July 4 was 234 pounds.  Her weight this morning is recorded as 227 pounds - Repeat echo this admission shows EF of 60 to 65% - Cardiology Dr Karen Farley --Repeat cxr in am  to see if pleural effusion requires thoracentesis --7/31--  repeat chest x-ray does not show any improvement in the left sided haziness. Orders placed very ultrasound-guided thoracentesis. Patient is in agreement. Will hold eliquis. --8/1--US thoracentesis today   Permanent atrial fibrillation (HCC)   Chronic anticoagulation - Continue Toprol 25 mg p.o. daily for rate control - Continue Eliquis(was recently switched from Xarelto due to worsening renal function). CHA2DS2-VASc score of 5---hold for thoracentesis     Type 2 diabetes mellitus (HCC) - Sliding scale insulin coverage while in the hospital --Her last hemoglobin A1c in March was 7.3     Stage 3b chronic kidney disease ([place]) - Monitor renal function in view of recent AKI to creatinine over 4 -creat stable at 1.4     Obesity, Class III, BMI 40-49.9 (morbid obesity) ([place]) - Complicating factor to overall prognosis and care     DVT prophylaxis: Eliquis Code Status: Full Family Communication: Updated Husband Karen Farley at bedside Disposition Plan: Diuresis for now, may be another 1-2 days before D/C per Dr Karen Farley Status is: Inpatient   Remains inpatient appropriate because:IV treatments appropriate due to intensity of illness or inability to take PO and Inpatient level of care appropriate due to severity of illness   Dispo: The patient is from: Home              Anticipated d/c is to: Home              Patient currently is not medically stable to d/c.              Difficult to place patient No           TOTAL TIME TAKING CARE OF THIS PATIENT: 25 minutes.  >50% time spent on counselling and coordination of care  Note: This dictation was prepared with Dragon dictation along with smaller phrase technology. Any transcriptional errors that result from this process are unintentional.  Karen Farley M.D    [facility]   CC: Primary care physician; Karen Roys, DO Patient ID: Karen Farley, female   DOB: [birth year], 75 y.o.   MRN: [id number]

## 2021-05-22 DIAGNOSIS — I4821 Permanent atrial fibrillation: Secondary | ICD-10-CM | POA: Diagnosis not present

## 2021-05-22 DIAGNOSIS — I5033 Acute on chronic diastolic (congestive) heart failure: Secondary | ICD-10-CM | POA: Diagnosis not present

## 2021-05-22 LAB — BASIC METABOLIC PANEL
Anion gap: 7 (ref 5–15)
BUN: 30 mg/dL — ABNORMAL HIGH (ref 8–23)
CO2: 32 mmol/L (ref 22–32)
Calcium: 9.1 mg/dL (ref 8.9–10.3)
Chloride: 97 mmol/L — ABNORMAL LOW (ref 98–111)
Creatinine, Ser: 1.41 mg/dL — ABNORMAL HIGH (ref 0.44–1.00)
GFR, Estimated: 39 mL/min — ABNORMAL LOW (ref >60)
Glucose, Bld: 182 mg/dL — ABNORMAL HIGH (ref 70–99)
Potassium: 3.8 mmol/L (ref 3.5–5.1)
Sodium: 136 mmol/L (ref 135–145)

## 2021-05-22 LAB — GLUCOSE, CAPILLARY
Glucose-Capillary: 160 mg/dL — ABNORMAL HIGH (ref 70–99)
Glucose-Capillary: 193 mg/dL — ABNORMAL HIGH (ref 70–99)
Glucose-Capillary: 116 mg/dL — ABNORMAL HIGH (ref 70–99)
Glucose-Capillary: 130 mg/dL — ABNORMAL HIGH (ref 70–99)

## 2021-05-22 LAB — PROTEIN, BODY FLUID (OTHER): Total Protein, Body Fluid Other: 2.1 g/dL

## 2021-05-22 MED ORDER — FUROSEMIDE 10 MG/ML IJ SOLN
80.0000 mg | Freq: Two times a day (BID) | INTRAMUSCULAR | Status: DC
  Administered 2021-05-22 – 2021-05-24 (×5): 80 mg via INTRAVENOUS
  Filled 2021-05-22 (×5): qty 8

## 2021-05-22 NOTE — Care Management Important Message (Signed)
Important Message  Patient Details  Name: Karen Farley MRN: [id number] Date of Birth: 1946-02-21   Medicare Important Message Given:  Yes     Dannette Barbara 05/22/2021, 11:35 AM

## 2021-05-22 NOTE — Progress Notes (Signed)
Progress Note  Patient Name: Karen Farley Date of Encounter: 05/22/2021  Primary Cardiologist: Kate Sable, MD   Subjective   No CP. Reports increased urination with up titration of diuresis. She feels as if her fluid status is improving, though she still notes ongoing lower extremity edema, abdominal distention, and that her breathing is not yet at baseline. No tachypalpitations.  No dizziness. Cough improving.  Inpatient Medications    Scheduled Meds:  apixaban  5 mg Oral BID   cholecalciferol  2,000 Units Oral Daily   furosemide  80 mg Intravenous Q12H   insulin aspart  0-15 Units Subcutaneous TID WC   insulin aspart  0-5 Units Subcutaneous QHS   latanoprost  1 drop Both Eyes QHS   metoprolol succinate  25 mg Oral Daily   multivitamin with minerals  1 tablet Oral Daily   potassium chloride  20 mEq Oral Daily   Continuous Infusions:  PRN Meds: acetaminophen **OR** acetaminophen, ondansetron **OR** ondansetron (ZOFRAN) IV   Vital Signs    Vitals:   05/22/21 0452 05/22/21 0600 05/22/21 0851 05/22/21 0916  BP: (!) 118/55  98/64 106/63  Pulse: 92  94 93  Resp: (!) 21  18   Temp: 98.3 F (36.8 C)  97.9 F (36.6 C)   TempSrc: Oral  Oral   SpO2: 96%  94%   Weight:  106.3 kg    Height:        Intake/Output Summary (Last 24 hours) at 05/22/2021 1040 Last data filed at 05/22/2021 0500 Gross per 24 hour  Intake 480 ml  Output 2100 ml  Net -1620 ml    Last 3 Weights 05/22/2021 05/21/2021 05/20/2021  Weight (lbs) 234 lb 5.6 oz 235 lb 225 lb 12 oz  Weight (kg) 106.3 kg 106.595 kg 102.4 kg      Telemetry    Atrial fibrillation with ventricular rate 90-100s- Personally Reviewed  ECG    No new tracings- Personally Reviewed  Physical Exam   GEN: No acute distress.  Seated in chair next to window.   Neck: Unable to assess JVD due to body habitus, position of patient Cardiac: IRIR with tachycardic rate, 2/6 RUSB murmur, rubs, or gallops.  Respiratory:  Slightly reduced right basilar breath sounds, L base breath sounds improved GI: slightly firm but significantly improved from 8/1, not TTP  MS: Bilateral 1-2+ LEE, erythema / skin changes noted consistent with lymphedema; No deformity. Neuro:  Nonfocal  Psych: Normal affect   Labs    High Sensitivity Troponin:   Recent Labs  Lab 05/16/21 1744 05/16/21 2028  TROPONINIHS 12 13       Chemistry Recent Labs  Lab 05/19/21 0500 05/20/21 1004 05/21/21 0512  NA 138 138 141  K 3.4* 4.0 3.4*  CL 95* 98 97*  CO2 29 31 32  GLUCOSE 132* 188* 137*  BUN 34* 32* 33*  CREATININE 1.52* 1.56* 1.40*  CALCIUM 9.0 9.5 9.3  GFRNONAA 36* 34* 39*  ANIONGAP 14 9 12       Hematology Recent Labs  Lab 05/16/21 1744 05/18/21 0643 05/19/21 0500  WBC 8.1 7.6 6.3  RBC 4.74 4.68 4.48  HGB 13.4 13.1 12.6  HCT 42.3 41.8 39.4  MCV 89.2 89.3 87.9  MCH 28.3 28.0 28.1  MCHC 31.7 31.3 32.0  RDW 16.3* 16.5* 16.3*  PLT 310 314 276     BNP Recent Labs  Lab 05/16/21 1740  BNP 122.6*      DDimer No results for input(s): DDIMER  in the last 168 hours.   Radiology    DG Chest Port 1 View  Result Date: 05/21/2021 CLINICAL DATA:  Left thoracentesis EXAM: PORTABLE CHEST 1 VIEW COMPARISON:  05/20/2021 FINDINGS: Chronic Cardiopericardial enlargement. Decrease in left pleural effusion. Stable reticulation bilaterally. No visible re-expansion edema. No pneumothorax. IMPRESSION: No complicating features of left thoracentesis. Electronically Signed   By: Monte Fantasia M.D.   On: 05/21/2021 10:20   US THORACENTESIS ASP PLEURAL SPACE W/IMG GUIDE  Result Date: 05/21/2021 INDICATION: Shortness of breath. Left-sided pleural effusion. Request diagnostic and therapeutic THORACENTESIS. EXAM: ULTRASOUND GUIDED LEFT THORACENTESIS MEDICATIONS: 1% plain lidocaine, 5 mL COMPLICATIONS: None immediate. PROCEDURE: An ultrasound guided thoracentesis was thoroughly discussed with the patient and questions answered. The  benefits, risks, alternatives and complications were also discussed. The patient understands and wishes to proceed with the procedure. Written consent was obtained. Ultrasound was performed to localize and mark an adequate pocket of fluid in the left chest. The area was then prepped and draped in the normal sterile fashion. 1% Lidocaine was used for local anesthesia. Under ultrasound guidance a 6 Fr Safe-T-Centesis catheter was introduced. Thoracentesis was performed. The catheter was removed and a dressing applied. FINDINGS: A total of approximately 400 mL of clear yellow fluid was removed. Samples were sent to the laboratory as requested by the clinical team. IMPRESSION: Successful ultrasound guided left thoracentesis yielding 400 mL of pleural fluid. Read by: Ascencion Dike PA-C Electronically Signed   By: Jacqulynn Cadet M.D.   On: 05/21/2021 10:29    Cardiac Studies   2D Echocardiogram 7.28.2022  1. Left ventricular ejection fraction, by estimation, is 60 to 65%. The  left ventricle has normal function. The left ventricle has no regional  wall motion abnormalities. Left ventricular diastolic parameters are  indeterminate.   2. Right ventricular systolic function is normal. The right ventricular  size is normal. Tricuspid regurgitation signal is inadequate for assessing  PA pressure.   3. Left atrial size was mild to moderately dilated.   4. Left pleural effusion noted   Patient Profile     75 y.o. female with a history of permanent atrial fibrillation, HFpEF, stage III chronic kidney disease, hypertension, obesity, lymphedema and hyperlipidemia, who was admitted 7/28 w/ acute on chronic hfpef and mod L pleural effusion.  Assessment & Plan    Acute on chronic heart failure with preserved ejection fraction --Improved but ongoing shortness of breath, cough LEE, abdominal distention.  EF 60 to 65%. S/p L lung thoracentesis 8/1 and increased lasix dose 8/2. Some LEE is likely due to lymphedema  - she brought her lymphedema pumps from home. -- Continue IV Lasix 80 mg every 12 hours, increased yesterday. -- Continue to monitor I/os, daily standing weights.  She is net -3.9L for admission and -1.4L yesterday. Wt 106.3  106.6kg.  --No BMET yet today. Yesterday, Cr 1.56  1.4 with BUN 32  33 --Continue current beta-blocker. No SGLT2 inhibitor at this time given renal function. Not previously on ACE/ARB given renal function.   Left pleural effusion --S/P left lung thoracentesis.    Permanent atrial fibrillation --Asymptomatic in atrial fibrillation with rates well controlled.  Continue current beta-blocker. Eliquis restarted yesterday after thoracentesis. No plan for DCCV this admission and given interruption in [facility]. Also, per pt, DCCV discussed in past and deferred per pt preference. Consider taht echo shows LA mild to moderately dilated and elevated BMI could influence ability to hold NSR if DCCV in the future.  Hypokalemia --Replete with  goal 4.0.    Lower extremity lymphedema --She has brought her lymphedema pumps from home.  Continue as daily lymphedema pumps.   Stage III CKD --Continue to follow with nephrology.  Will order BMET.  Avoid nephrotoxins.  DM2 --A1c 6.6.  Continue SSI.  Per IM.  Given renal function, no SGLT2 inhibitor for now.    For questions or updates, please contact [place] Please consult [email or web address] for contact info under        Signed, Arvil Chaco, PA-C  05/22/2021, 10:40 AM

## 2021-05-22 NOTE — Progress Notes (Signed)
[facility] [place] NAME: Maiyah Goyne    MR#:  [id number]  DATE OF BIRTH:  02-Mar-1946  SUBJECTIVE:   patient sitting out in the chair. Currently not on oxygen.  UOP 2L ?24 hours after Lasix was increased  REVIEW OF SYSTEMS:   Review of Systems  Constitutional:  Negative for chills, fever and weight loss.  HENT:  Negative for ear discharge, ear pain and nosebleeds.   Eyes:  Negative for blurred vision, pain and discharge.  Respiratory:  Positive for shortness of breath. Negative for sputum production, wheezing and stridor.   Cardiovascular:  Positive for leg swelling. Negative for chest pain, palpitations, orthopnea and PND.  Gastrointestinal:  Negative for abdominal pain, diarrhea, nausea and vomiting.  Genitourinary:  Negative for frequency and urgency.  Musculoskeletal:  Negative for back pain and joint pain.  Neurological:  Positive for weakness. Negative for sensory change, speech change and focal weakness.  Psychiatric/Behavioral:  Negative for depression and hallucinations. The patient is not nervous/anxious.   Tolerating Diet:yes Tolerating PT: self ambulatory  DRUG ALLERGIES:  No Known Allergies  VITALS:  Blood pressure (!) 118/55, pulse 92, temperature 98.3 F (36.8 C), temperature source Oral, resp. rate (!) 21, height 5\' 2"  (1.575 m), weight 106.3 kg, SpO2 96 %.  PHYSICAL EXAMINATION:   Physical Exam 75 year old obese female sitting in chair, calm, NAD  Neck supple, no JVD Lungs clear to auscultation no wheezing decreased BS bases Cardiovascular: Irregularly irregular heart sounds.  1/6 systolic ejection murmur at the sternal border. Abdomen soft benign, increase girth Extremities 2+ lower extremity edema bilaterally, chronic lymphedema Neuro Alert alert oriented x4 grossly intact Skin: No rash or lesion    LABORATORY PANEL:  CBC Recent Labs  Lab 05/19/21 0500  WBC 6.3  HGB 12.6  HCT 39.4  PLT 276      Chemistries  Recent Labs  Lab 05/21/21 0512  NA 141  K 3.4*  CL 97*  CO2 32  GLUCOSE 137*  BUN 33*  CREATININE 1.40*  CALCIUM 9.3    Cardiac Enzymes No results for input(s): TROPONINI in the last 168 hours. RADIOLOGY:  DG Chest Port 1 View  Result Date: 05/21/2021 CLINICAL DATA:  Left thoracentesis EXAM: PORTABLE CHEST 1 VIEW COMPARISON:  05/20/2021 FINDINGS: Chronic Cardiopericardial enlargement. Decrease in left pleural effusion. Stable reticulation bilaterally. No visible re-expansion edema. No pneumothorax. IMPRESSION: No complicating features of left thoracentesis. Electronically Signed   By: Monte Fantasia M.D.   On: 05/21/2021 10:20   US THORACENTESIS ASP PLEURAL SPACE W/IMG GUIDE  Result Date: 05/21/2021 INDICATION: Shortness of breath. Left-sided pleural effusion. Request diagnostic and therapeutic THORACENTESIS. EXAM: ULTRASOUND GUIDED LEFT THORACENTESIS MEDICATIONS: 1% plain lidocaine, 5 mL COMPLICATIONS: None immediate. PROCEDURE: An ultrasound guided thoracentesis was thoroughly discussed with the patient and questions answered. The benefits, risks, alternatives and complications were also discussed. The patient understands and wishes to proceed with the procedure. Written consent was obtained. Ultrasound was performed to localize and mark an adequate pocket of fluid in the left chest. The area was then prepped and draped in the normal sterile fashion. 1% Lidocaine was used for local anesthesia. Under ultrasound guidance a 6 Fr Safe-T-Centesis catheter was introduced. Thoracentesis was performed. The catheter was removed and a dressing applied. FINDINGS: A total of approximately 400 mL of clear yellow fluid was removed. Samples were sent to the laboratory as requested by the clinical team. IMPRESSION: Successful ultrasound guided left thoracentesis yielding 400 mL  of pleural fluid. Read by: Ascencion Dike PA-C Electronically Signed   By: Jacqulynn Cadet M.D.   On:  05/21/2021 10:29   ASSESSMENT AND PLAN:  TRENITA HULME is a 75 y.o. female with medical history significant for HFpEF (EF 60-65% on echo 11/2019), atrial fibrillation on Eliquis, essential hypertension, hyperlipidemia, chronic lymphedema, chronic venous insufficiency, PAD, type 2 diabetes, CKD stage IIIb is admitted for heart failure exacerbation.  She was recently hospitalized in March 22, 2001 2 in the setting of hypotension and acute kidney injury requiring reduction in diuretic dose of torsemide 20 mg 3 times a week.  She continued to get more lower extremity swelling, weight gain and increase in abdominal girth.  Anasarca Acute on chronic diastolic CHF  Left Pleural effusion - Patient with fluid retention not responding to outpatient increase in torsemide, - CXRon admisison with left pleural effusion -moderate - Continue IV Lasix 40 mg twice daily with close BP monitoring and monitoring of renal function -Her last recorded weight at cardiology office on July 4 was 234 pounds.  Her weight this morning is recorded as 227 pounds - Repeat echo this admission shows EF of 60 to 65% - Cardiology Dr Rockey Situ --Repeat cxr in am  to see if pleural effusion requires thoracentesis --7/31-- repeat chest x-ray does not show any improvement in the left sided haziness. Orders placed very ultrasound-guided thoracentesis. Patient is in agreement. Will hold eliquis. --8/1--US thoracentesis t--400 cc transudate --LAsix increased to IV 80 mg bid--per Dr Fletcher Anon --8/2--no new complaints. Await cardiology to decide when pt ok to d/c   Permanent atrial fibrillation (HCC)   Chronic anticoagulation - Continue Toprol 25 mg p.o. daily for rate control - Continue Eliquis(was recently switched from Xarelto due to worsening renal function). CHA2DS2-VASc score of 5     Type 2 diabetes mellitus (HCC) - Sliding scale insulin coverage while in the hospital --Her last hemoglobin A1c in March was 7.3 --not on po meds at  home     Stage 3b chronic kidney disease ([place]) - Monitor renal function in view of recent AKI to creatinine over 4 -creat stable at 1.4     Obesity, Class III, BMI 40-49.9 (morbid obesity) ([place]) - Complicating factor to overall prognosis and care     DVT prophylaxis: Eliquis Code Status: Full Family Communication: Updated Husband [name] at bedside Disposition Plan: d/c plans per cardiology--[facility] Status is: Inpatient   Remains inpatient appropriate because:IV treatments appropriate due to intensity of illness or inability to take PO and Inpatient level of care appropriate due to severity of illness   Dispo: The patient is from: Home              Anticipated d/c is to: Home              Patient currently is not medically stable to d/c.              Difficult to place patient No           TOTAL TIME TAKING CARE OF THIS PATIENT: 25 minutes.  >50% time spent on counselling and coordination of care  Note: This dictation was prepared with Dragon dictation along with smaller phrase technology. Any transcriptional errors that result from this process are unintentional.  Fritzi Mandes M.D    [facility]   CC: Primary care physician; Valerie Roys, DO Patient ID: [name], female   DOB: 1945-12-03, 75 y.o.   MRN: [id number]

## 2021-05-22 NOTE — Plan of Care (Signed)

## 2021-05-23 DIAGNOSIS — I5033 Acute on chronic diastolic (congestive) heart failure: Secondary | ICD-10-CM | POA: Diagnosis not present

## 2021-05-23 DIAGNOSIS — E8770 Fluid overload, unspecified: Secondary | ICD-10-CM | POA: Diagnosis not present

## 2021-05-23 DIAGNOSIS — I4819 Other persistent atrial fibrillation: Secondary | ICD-10-CM | POA: Diagnosis not present

## 2021-05-23 DIAGNOSIS — R601 Generalized edema: Secondary | ICD-10-CM | POA: Diagnosis not present

## 2021-05-23 LAB — BASIC METABOLIC PANEL
Anion gap: 9 (ref 5–15)
BUN: 31 mg/dL — ABNORMAL HIGH (ref 8–23)
CO2: 31 mmol/L (ref 22–32)
Calcium: 9 mg/dL (ref 8.9–10.3)
Chloride: 96 mmol/L — ABNORMAL LOW (ref 98–111)
Creatinine, Ser: 1.45 mg/dL — ABNORMAL HIGH (ref 0.44–1.00)
GFR, Estimated: 38 mL/min — ABNORMAL LOW (ref >60)
Glucose, Bld: 136 mg/dL — ABNORMAL HIGH (ref 70–99)
Potassium: 3.6 mmol/L (ref 3.5–5.1)
Sodium: 136 mmol/L (ref 135–145)

## 2021-05-23 LAB — GLUCOSE, CAPILLARY
Glucose-Capillary: 133 mg/dL — ABNORMAL HIGH (ref 70–99)
Glucose-Capillary: 227 mg/dL — ABNORMAL HIGH (ref 70–99)
Glucose-Capillary: 147 mg/dL — ABNORMAL HIGH (ref 70–99)
Glucose-Capillary: 146 mg/dL — ABNORMAL HIGH (ref 70–99)

## 2021-05-23 LAB — PH, BODY FLUID: pH, Body Fluid: 7.7

## 2021-05-23 MED ORDER — METOLAZONE 2.5 MG PO TABS
2.5000 mg | ORAL_TABLET | Freq: Every day | ORAL | Status: DC
  Administered 2021-05-23: 2.5 mg via ORAL
  Filled 2021-05-23: qty 1

## 2021-05-23 MED ORDER — BACID PO TABS
2.0000 | ORAL_TABLET | Freq: Three times a day (TID) | ORAL | Status: DC
  Filled 2021-05-23 (×2): qty 2

## 2021-05-23 MED ORDER — METOLAZONE 5 MG PO TABS
5.0000 mg | ORAL_TABLET | Freq: Every day | ORAL | Status: DC
  Administered 2021-05-24: 5 mg via ORAL
  Filled 2021-05-23 (×2): qty 1

## 2021-05-23 MED ORDER — METOPROLOL SUCCINATE ER 50 MG PO TB24
50.0000 mg | ORAL_TABLET | Freq: Every day | ORAL | Status: DC
  Administered 2021-05-23 – 2021-05-27 (×5): 50 mg via ORAL
  Filled 2021-05-23 (×5): qty 1

## 2021-05-23 MED ORDER — RISAQUAD PO CAPS
1.0000 | ORAL_CAPSULE | Freq: Three times a day (TID) | ORAL | Status: DC
  Administered 2021-05-23 – 2021-05-27 (×11): 1 via ORAL
  Filled 2021-05-23 (×11): qty 1

## 2021-05-23 MED ORDER — METOLAZONE 2.5 MG PO TABS
2.5000 mg | ORAL_TABLET | Freq: Once | ORAL | Status: AC
  Administered 2021-05-23: 2.5 mg via ORAL
  Filled 2021-05-23: qty 1

## 2021-05-23 MED ORDER — CEPHALEXIN 500 MG PO CAPS
500.0000 mg | ORAL_CAPSULE | Freq: Three times a day (TID) | ORAL | Status: DC
  Administered 2021-05-23 – 2021-05-25 (×5): 500 mg via ORAL
  Filled 2021-05-23 (×5): qty 1

## 2021-05-23 MED ORDER — POTASSIUM CHLORIDE CRYS ER 20 MEQ PO TBCR
40.0000 meq | EXTENDED_RELEASE_TABLET | Freq: Every day | ORAL | Status: DC
  Administered 2021-05-23 – 2021-05-26 (×4): 40 meq via ORAL
  Filled 2021-05-23 (×4): qty 2

## 2021-05-23 NOTE — Progress Notes (Addendum)
[facility]  ROUNDING NOTE   Subjective:   Karen Farley is a 75 y.o. female with past medical history of diastolic CHF, diabetes, lymphedema, Atrial fib on Eliquis and CKD 3B. She presents to the ED with increased lower extremity edema and shortness of breath. She has been admitted for Anasarca [R60.1] Peripheral edema [R60.9] Pleural effusion [J90] Volume overload [E87.70]   Patient is seen in our office by Dr Candiss Norse. She had a recent admission for hypotension and AKI due to diuretic intolerance. She states she was taking her medications as prescribed. Denies cough. Tolerated meals with no nausea, vomiting and diarrhea. Patient has history of lymphedema and states she wears her mechanical compression hose twice a day at home.  Thoracentesis completed on 05/21/21 with 452ml taken off. States she feels better but feels there's more there. Patient also states she hasn't felt like she has been urinating as much as she should with lasix.    Objective:  Vital signs in last 24 hours:  Temp:  [97.6 F (36.4 C)-97.7 F (36.5 C)] 97.6 F (36.4 C) (08/03 1137) Pulse Rate:  [71-92] 71 (08/03 1137) Resp:  [17-19] 17 (08/03 1137) BP: (92-141)/(39-98) 104/88 (08/03 1137) SpO2:  [93 %-97 %] 97 % (08/03 1137) Weight:  [101.9 kg-102.3 kg] 101.9 kg (08/03 0504)  Weight change: -3.968 kg Filed Weights   05/22/21 0600 05/22/21 1612 05/23/21 0504  Weight: 106.3 kg 102.3 kg 101.9 kg    Intake/Output: I/O last 3 completed shifts: In: 240 [P.O.:240] Out: 4000 [Urine:4000]   Intake/Output this shift:  Total I/O In: 240 [P.O.:240] Out: 1300 [Urine:1300]  Physical Exam: General: NAD, sitting up in chair  Head: Normocephalic, atraumatic. Moist oral mucosal membranes  Eyes: Anicteric  Lungs:  Diminished in bases, normal effort  Heart: Regular rate and rhythm  Abdomen:  Soft, nontender, distended  Extremities:  2+ peripheral edema.abdominal wall edema  Neurologic: Alert, moving  all four extremities  Skin: No lesions       Basic Metabolic Panel: Recent Labs  Lab 05/19/21 0500 05/20/21 1004 05/21/21 0512 05/22/21 1116 05/23/21 0423  NA 138 138 141 136 136  K 3.4* 4.0 3.4* 3.8 3.6  CL 95* 98 97* 97* 96*  CO2 29 31 32 32 31  GLUCOSE 132* 188* 137* 182* 136*  BUN 34* 32* 33* 30* 31*  CREATININE 1.52* 1.56* 1.40* 1.41* 1.45*  CALCIUM 9.0 9.5 9.3 9.1 9.0    Liver Function Tests: No results for input(s): AST, ALT, ALKPHOS, BILITOT, PROT, ALBUMIN in the last 168 hours. No results for input(s): LIPASE, AMYLASE in the last 168 hours. No results for input(s): AMMONIA in the last 168 hours.  CBC: Recent Labs  Lab 05/16/21 1744 05/18/21 0643 05/19/21 0500  WBC 8.1 7.6 6.3  HGB 13.4 13.1 12.6  HCT 42.3 41.8 39.4  MCV 89.2 89.3 87.9  PLT 310 314 276    Cardiac Enzymes: No results for input(s): CKTOTAL, CKMB, CKMBINDEX, TROPONINI in the last 168 hours.  BNP: Invalid input(s): POCBNP  CBG: Recent Labs  Lab 05/22/21 1200 05/22/21 1616 05/22/21 2100 05/23/21 0749 05/23/21 1135  GLUCAP 193* 116* 130* 133* 227*    Microbiology: Results for orders placed or performed during the hospital encounter of 05/17/21  Resp Panel by RT-PCR (Flu A&B, Covid) Nasopharyngeal Swab     Status: None   Collection Time: 05/17/21  5:20 AM   Specimen: Nasopharyngeal Swab; Nasopharyngeal(NP) swabs in vial transport medium  Result Value Ref Range Status  SARS Coronavirus 2 by RT PCR NEGATIVE NEGATIVE Final    Comment: (NOTE) SARS-CoV-2 target nucleic acids are NOT DETECTED.  The SARS-CoV-2 RNA is generally detectable in upper respiratory specimens during the acute phase of infection. The lowest concentration of SARS-CoV-2 viral copies this assay can detect is 138 copies/mL. A negative result does not preclude SARS-Cov-2 infection and should not be used as the sole basis for treatment or other patient management decisions. A negative result may occur with   improper specimen collection/handling, submission of specimen other than nasopharyngeal swab, presence of viral mutation(s) within the areas targeted by this assay, and inadequate number of viral copies(<138 copies/mL). A negative result must be combined with clinical observations, patient history, and epidemiological information. The expected result is Negative.  Fact Sheet for Patients:  EntrepreneurPulse.com.au  Fact Sheet for Healthcare Providers:  IncredibleEmployment.be  This test is no t yet approved or cleared by the [place] FDA and  has been authorized for detection and/or diagnosis of SARS-CoV-2 by FDA under an Emergency Use Authorization (EUA). This EUA will remain  in effect (meaning this test can be used) for the duration of the COVID-19 declaration under Section 564(b)(1) of the Act, 21 U.S.C.section 360bbb-3(b)(1), unless the authorization is terminated  or revoked sooner.       Influenza A by PCR NEGATIVE NEGATIVE Final   Influenza B by PCR NEGATIVE NEGATIVE Final    Comment: (NOTE) The Xpert Xpress SARS-CoV-2/FLU/RSV plus assay is intended as an aid in the diagnosis of influenza from Nasopharyngeal swab specimens and should not be used as a sole basis for treatment. Nasal washings and aspirates are unacceptable for Xpert Xpress SARS-CoV-2/FLU/RSV testing.  Fact Sheet for Patients: EntrepreneurPulse.com.au  Fact Sheet for Healthcare Providers: IncredibleEmployment.be  This test is not yet approved or cleared by the [place] FDA and has been authorized for detection and/or diagnosis of SARS-CoV-2 by FDA under an Emergency Use Authorization (EUA). This EUA will remain in effect (meaning this test can be used) for the duration of the COVID-19 declaration under Section 564(b)(1) of the Act, 21 U.S.C. section 360bbb-3(b)(1), unless the authorization is terminated  or revoked.  Performed at [facility], [address] [place], [place] [address]     Coagulation Studies: No results for input(s): LABPROT, INR in the last 72 hours.  Urinalysis: No results for input(s): COLORURINE, LABSPEC, PHURINE, GLUCOSEU, HGBUR, BILIRUBINUR, KETONESUR, PROTEINUR, UROBILINOGEN, NITRITE, LEUKOCYTESUR in the last 72 hours.  Invalid input(s): APPERANCEUR    Imaging: No results found.   Medications:     apixaban  5 mg Oral BID   cholecalciferol  2,000 Units Oral Daily   furosemide  80 mg Intravenous BID   insulin aspart  0-15 Units Subcutaneous TID WC   insulin aspart  0-5 Units Subcutaneous QHS   latanoprost  1 drop Both Eyes QHS   [START ON 05/24/2021] metolazone  5 mg Oral Daily   metoprolol succinate  50 mg Oral Daily   multivitamin with minerals  1 tablet Oral Daily   potassium chloride  40 mEq Oral Daily   acetaminophen **OR** acetaminophen, ondansetron **OR** ondansetron (ZOFRAN) IV  Assessment/ Plan:  [name] is a 75 y.o.  female past medical history of diastolic CHF, diabetes, lymphedema, Atrial fib on Eliquis and CKD 3B. She presents to the ED with increased lower extremity edema and shortness of breath. She has been admitted for Anasarca [R60.1] Peripheral edema [R60.9] Pleural effusion [J90] Volume overload [E87.70]   Chronic  Kidney disease 3B Followed by [facility] Dr Candiss Norse. Recent admission for AKI. Discharged on Torsemide 20mg  TID. Ordered Furosemide 80mg  twice daily and Metolazone 5mg  added today. Will monitor output and labs.   Lab Results  Component Value Date   CREATININE 1.45 (H) 05/23/2021   CREATININE 1.41 (H) 05/22/2021   CREATININE 1.40 (H) 05/21/2021    Intake/Output Summary (Last 24 hours) at 05/23/2021 1340 Last data filed at 05/23/2021 1253 Gross per 24 hour  Intake 240 ml  Output 3400 ml  Net -3160 ml   2. .Diabetes mellitus type II with chronic kidney disease  noninsulin dependent. Most recent  hemoglobin A1c is 6.6 on 05/17/21. Glucose stable. Primary team to manage SSI.  3.diastolic Heart Failure ECHO on 04/27/21 shows EF 60-65% Furosemide 80mg  BID Metolazone 5mg  daily Cardiology following    LOS: [place] 8/3/20221:40 PM

## 2021-05-23 NOTE — Plan of Care (Signed)

## 2021-05-23 NOTE — Progress Notes (Signed)
   HFpEF 60 to 65%.   With patient again today, she is currently sitting up in the in the recliner at bedside with feet elevated.  She has continued to use the lymphedema compression pumps twice a day for an hour each session.  Her legs remain edematous and skin is shiny.  Discussed with cardiology, patient will be fitted with Ted stocking that will be placed on in the morning and removed at bedtime.  She also states that she does not feel that she is urinating as much she had the day before.  Cardiology has added metolazone along with the Lasix.  Again talked about low-sodium and fluid restriction.  She had no further questions.   Pricilla Riffle RN [facility]

## 2021-05-23 NOTE — Progress Notes (Signed)
Progress Note  Patient Name: Karen Farley Date of Encounter: 05/23/2021  Primary Cardiologist: Kate Sable, MD   Subjective   No CP.  No tachypalpitations.  Reports breathing the same as yesterday and urine output has tapered off. Not at her baseline. Still notes LEE, abdominal distention, cough, and difficulty breathing.  Informed patient of medication changes as below with patient understanding.  Inpatient Medications    Scheduled Meds:  apixaban  5 mg Oral BID   cholecalciferol  2,000 Units Oral Daily   furosemide  80 mg Intravenous BID   insulin aspart  0-15 Units Subcutaneous TID WC   insulin aspart  0-5 Units Subcutaneous QHS   latanoprost  1 drop Both Eyes QHS   metolazone  2.5 mg Oral Daily   metoprolol succinate  50 mg Oral Daily   multivitamin with minerals  1 tablet Oral Daily   potassium chloride  40 mEq Oral Daily   Continuous Infusions:  PRN Meds: acetaminophen **OR** acetaminophen, ondansetron **OR** ondansetron (ZOFRAN) IV   Vital Signs    Vitals:   05/22/21 1743 05/23/21 0321 05/23/21 0504 05/23/21 0752  BP: 112/75 103/77  (!) 141/98  Pulse: 85 92  81  Resp:  19  18  Temp:  97.6 F (36.4 C)  97.7 F (36.5 C)  TempSrc:  Oral    SpO2:  93%  96%  Weight:   101.9 kg   Height:        Intake/Output Summary (Last 24 hours) at 05/23/2021 0824 Last data filed at 05/23/2021 0752 Gross per 24 hour  Intake 240 ml  Output 3200 ml  Net -2960 ml    Last 3 Weights 05/23/2021 05/22/2021 05/22/2021  Weight (lbs) 224 lb 11.2 oz 225 lb 9.6 oz 234 lb 5.6 oz  Weight (kg) 101.923 kg 102.331 kg 106.3 kg      Telemetry    Atrial fibrillation with ventricular rate 90-100s- Personally Reviewed  ECG    No new tracings- Personally Reviewed  Physical Exam   GEN: No acute distress.  Seated in chair next to window, eating breakfast.   Neck: Unable to assess JVD due to body habitus, position of patient Cardiac: Distant, IRIR with tachycardic rate, 2/6  RUSB murmur, rubs, or gallops.  Respiratory: Right base crackles, reduced left base breath sounds GI: firm, distended MS: Bilateral 1-2+ LEE, erythema / skin changes noted consistent with lymphedema; No deformity. Neuro:  Nonfocal  Psych: Normal affect   Labs    High Sensitivity Troponin:   Recent Labs  Lab 05/16/21 1744 05/16/21 2028  TROPONINIHS 12 13       Chemistry Recent Labs  Lab 05/21/21 0512 05/22/21 1116 05/23/21 0423  NA 141 136 136  K 3.4* 3.8 3.6  CL 97* 97* 96*  CO2 32 32 31  GLUCOSE 137* 182* 136*  BUN 33* 30* 31*  CREATININE 1.40* 1.41* 1.45*  CALCIUM 9.3 9.1 9.0  GFRNONAA 39* 39* 38*  ANIONGAP 12 7 9       Hematology Recent Labs  Lab 05/16/21 1744 05/18/21 0643 05/19/21 0500  WBC 8.1 7.6 6.3  RBC 4.74 4.68 4.48  HGB 13.4 13.1 12.6  HCT 42.3 41.8 39.4  MCV 89.2 89.3 87.9  MCH 28.3 28.0 28.1  MCHC 31.7 31.3 32.0  RDW 16.3* 16.5* 16.3*  PLT 310 314 276     BNP Recent Labs  Lab 05/16/21 1740  BNP 122.6*      DDimer No results for input(s): DDIMER in the  last 168 hours.   Radiology    DG Chest Port 1 View  Result Date: 05/21/2021 CLINICAL DATA:  Left thoracentesis EXAM: PORTABLE CHEST 1 VIEW COMPARISON:  05/20/2021 FINDINGS: Chronic Cardiopericardial enlargement. Decrease in left pleural effusion. Stable reticulation bilaterally. No visible re-expansion edema. No pneumothorax. IMPRESSION: No complicating features of left thoracentesis. Electronically Signed   By: Monte Fantasia M.D.   On: 05/21/2021 10:20   US THORACENTESIS ASP PLEURAL SPACE W/IMG GUIDE  Result Date: 05/21/2021 INDICATION: Shortness of breath. Left-sided pleural effusion. Request diagnostic and therapeutic THORACENTESIS. EXAM: ULTRASOUND GUIDED LEFT THORACENTESIS MEDICATIONS: 1% plain lidocaine, 5 mL COMPLICATIONS: None immediate. PROCEDURE: An ultrasound guided thoracentesis was thoroughly discussed with the patient and questions answered. The benefits, risks,  alternatives and complications were also discussed. The patient understands and wishes to proceed with the procedure. Written consent was obtained. Ultrasound was performed to localize and mark an adequate pocket of fluid in the left chest. The area was then prepped and draped in the normal sterile fashion. 1% Lidocaine was used for local anesthesia. Under ultrasound guidance a 6 Fr Safe-T-Centesis catheter was introduced. Thoracentesis was performed. The catheter was removed and a dressing applied. FINDINGS: A total of approximately 400 mL of clear yellow fluid was removed. Samples were sent to the laboratory as requested by the clinical team. IMPRESSION: Successful ultrasound guided left thoracentesis yielding 400 mL of pleural fluid. Read by: Ascencion Dike PA-C Electronically Signed   By: Jacqulynn Cadet M.D.   On: 05/21/2021 10:29    Cardiac Studies   2D Echocardiogram 7.28.2022  1. Left ventricular ejection fraction, by estimation, is 60 to 65%. The  left ventricle has normal function. The left ventricle has no regional  wall motion abnormalities. Left ventricular diastolic parameters are  indeterminate.   2. Right ventricular systolic function is normal. The right ventricular  size is normal. Tricuspid regurgitation signal is inadequate for assessing  PA pressure.   3. Left atrial size was mild to moderately dilated.   4. Left pleural effusion noted   Patient Profile     75 y.o. female with a history of permanent atrial fibrillation, HFpEF, stage III chronic kidney disease, hypertension, obesity, lymphedema and hyperlipidemia, who was admitted 7/28 w/ acute on chronic hfpef and mod L pleural effusion.  Assessment & Plan    Acute on chronic heart failure with preserved ejection fraction -- Breathing, cough LEE, abdominal distention not improved from yesterday.  EF 60 to 65%. S/p L lung thoracentesis 8/1 with 400 cc removed from left side and increased lasix dose 8/2. Lymphedema does  contribute to LEE, and she has brought her lymphedema pumps with her to  use during this admission. On exam, she remains significantly volume overloaded with recommendations as below and goal output of at least 2L per day. Standing wts recommended over that of bed wts due to inaccuracy of bed wt.  --Continue IV Lasix 80 mg every 12 hours. Increased KCL tab to 65mEq daily. --Started metolazone 2.5 mg daily  Instructions provided to administer 30 minutes before her loop diuretic. -- Continue to monitor I/os, daily standing weights (more accurate).   Net -7.1L for admission and -2.6L yesterday.  Wt 102.3  101.9kg.  --Daily BMET. Renal function stable.  Cr 1.41  1.45 with BUN 30  31 --Increased to Toprol 50mg  daily.  --If Cr allows, consider addition of SGLT2 inhibitor +/- spironolactone before discharge. Not previously on ACE/ARB given renal function.   Left pleural effusion --S/P left lung thoracentesis  with 400cc removed and sent to lab - still in process.    Permanent atrial fibrillation --Asymptomatic in atrial fibrillation.   --Increased to Toprol 50mg  daily given room in HR/BP. --Continue Eliquis 5mg  BID. --No plan for DCCV.  Hypokalemia --Replete with goal 4.0.   --Increased to KCL tab 11mEq daily --Consider adding spironolactone before discharge if Cr allows.  Lower extremity lymphedema --She has brought her lymphedema pumps from home.   --Continue daily lymphedema pumps.   Stage III CKD --Continue to follow with nephrology.  Avoid nephrotoxins.  DM2 --A1c 6.6.  Continue SSI.  Per IM. If Cr allows, consider SGLT2 inhibitor before discharge.    For questions or updates, please contact [place] Please consult [email or web address] for contact info under        Signed, Arvil Chaco, PA-C  05/23/2021, 8:24 AM

## 2021-05-23 NOTE — Progress Notes (Signed)
PROGRESS NOTE    Karen Farley  OHY:[id number] DOB: [birth year] DOA: 05/17/2021 PCP: Valerie Roys, DO    Brief Narrative:   Karen Farley is a 75 year old woman with acute on chronic HFpEF and permanent atrial fibrillation  05/23/2021 feels better but feels like she is not urinating as much as she is getting IV Lasix.  Still not at baseline.  Consultants:  Cardiology  Procedures:   Antimicrobials:      Subjective: No chest pain, still with shortness of breath but better, no dizziness  Objective: Vitals:   05/23/21 0321 05/23/21 0504 05/23/21 0752 05/23/21 1137  BP: 103/77  (!) 141/98 104/88  Pulse: 92  81 71  Resp: 19  18 17   Temp: 97.6 F (36.4 C)  97.7 F (36.5 C) 97.6 F (36.4 C)  TempSrc: Oral     SpO2: 93%  96% 97%  Weight:  101.9 kg    Height:        Intake/Output Summary (Last 24 hours) at 05/23/2021 1254 Last data filed at 05/23/2021 1253 Gross per 24 hour  Intake 240 ml  Output 3400 ml  Net -3160 ml   Filed Weights   05/22/21 0600 05/22/21 1612 05/23/21 0504  Weight: 106.3 kg 102.3 kg 101.9 kg    Examination:  General exam: Appears calm and comfortable  Respiratory system: Decreased breath sounds at bases Cardiovascular system: S1 & S2 heard, reg-irreg Gastrointestinal system: Abdomen is nondistended, soft and nontender.  Normal bowel sounds heard. Central nervous system: Alert and oriented.  Grossly intact Extremities: Positive edema bilaterally Psychiatry:Mood & affect appropriate.     Data Reviewed: I have personally reviewed following labs and imaging studies  CBC: Recent Labs  Lab 05/16/21 1744 05/18/21 0643 05/19/21 0500  WBC 8.1 7.6 6.3  HGB 13.4 13.1 12.6  HCT 42.3 41.8 39.4  MCV 89.2 89.3 87.9  PLT 310 314 948   Basic Metabolic Panel: Recent Labs  Lab 05/19/21 0500 05/20/21 1004 05/21/21 0512 05/22/21 1116 05/23/21 0423  NA 138 138 141 136 136  K 3.4* 4.0 3.4* 3.8 3.6  CL 95* 98 97* 97* 96*  CO2 29 31 32 32 31   GLUCOSE 132* 188* 137* 182* 136*  BUN 34* 32* 33* 30* 31*  CREATININE 1.52* 1.56* 1.40* 1.41* 1.45*  CALCIUM 9.0 9.5 9.3 9.1 9.0   GFR: Estimated Creatinine Clearance: 37.5 mL/min (A) (by C-G formula based on SCr of 1.45 mg/dL (H)). Liver Function Tests: No results for input(s): AST, ALT, ALKPHOS, BILITOT, PROT, ALBUMIN in the last 168 hours. No results for input(s): LIPASE, AMYLASE in the last 168 hours. No results for input(s): AMMONIA in the last 168 hours. Coagulation Profile: No results for input(s): INR, PROTIME in the last 168 hours. Cardiac Enzymes: No results for input(s): CKTOTAL, CKMB, CKMBINDEX, TROPONINI in the last 168 hours. BNP (last 3 results) No results for input(s): PROBNP in the last 8760 hours. HbA1C: No results for input(s): HGBA1C in the last 72 hours. CBG: Recent Labs  Lab 05/22/21 1200 05/22/21 1616 05/22/21 2100 05/23/21 0749 05/23/21 1135  GLUCAP 193* 116* 130* 133* 227*   Lipid Profile: No results for input(s): CHOL, HDL, LDLCALC, TRIG, CHOLHDL, LDLDIRECT in the last 72 hours. Thyroid Function Tests: No results for input(s): TSH, T4TOTAL, FREET4, T3FREE, THYROIDAB in the last 72 hours. Anemia Panel: No results for input(s): VITAMINB12, FOLATE, FERRITIN, TIBC, IRON, RETICCTPCT in the last 72 hours. Sepsis Labs: Recent Labs  Lab 05/17/21 0630  PROCALCITON <0.10  Recent Results (from the past 240 hour(s))  Resp Panel by RT-PCR (Flu A&B, Covid) Nasopharyngeal Swab     Status: None   Collection Time: 05/17/21  5:20 AM   Specimen: Nasopharyngeal Swab; Nasopharyngeal(NP) swabs in vial transport medium  Result Value Ref Range Status   SARS Coronavirus 2 by RT PCR NEGATIVE NEGATIVE Final    Comment: (NOTE) SARS-CoV-2 target nucleic acids are NOT DETECTED.  The SARS-CoV-2 RNA is generally detectable in upper respiratory specimens during the acute phase of infection. The lowest concentration of SARS-CoV-2 viral copies this assay can detect  is 138 copies/mL. A negative result does not preclude SARS-Cov-2 infection and should not be used as the sole basis for treatment or other patient management decisions. A negative result may occur with  improper specimen collection/handling, submission of specimen other than nasopharyngeal swab, presence of viral mutation(s) within the areas targeted by this assay, and inadequate number of viral copies(<138 copies/mL). A negative result must be combined with clinical observations, patient history, and epidemiological information. The expected result is Negative.  Fact Sheet for Patients:  EntrepreneurPulse.com.au  Fact Sheet for Healthcare Providers:  IncredibleEmployment.be  This test is no t yet approved or cleared by the [place] FDA and  has been authorized for detection and/or diagnosis of SARS-CoV-2 by FDA under an Emergency Use Authorization (EUA). This EUA will remain  in effect (meaning this test can be used) for the duration of the COVID-19 declaration under Section 564(b)(1) of the Act, 21 U.S.C.section 360bbb-3(b)(1), unless the authorization is terminated  or revoked sooner.       Influenza A by PCR NEGATIVE NEGATIVE Final   Influenza B by PCR NEGATIVE NEGATIVE Final    Comment: (NOTE) The Xpert Xpress SARS-CoV-2/FLU/RSV plus assay is intended as an aid in the diagnosis of influenza from Nasopharyngeal swab specimens and should not be used as a sole basis for treatment. Nasal washings and aspirates are unacceptable for Xpert Xpress SARS-CoV-2/FLU/RSV testing.  Fact Sheet for Patients: EntrepreneurPulse.com.au  Fact Sheet for Healthcare Providers: IncredibleEmployment.be  This test is not yet approved or cleared by the [place] FDA and has been authorized for detection and/or diagnosis of SARS-CoV-2 by FDA under an Emergency Use Authorization (EUA). This EUA will remain in effect  (meaning this test can be used) for the duration of the COVID-19 declaration under Section 564(b)(1) of the Act, 21 U.S.C. section 360bbb-3(b)(1), unless the authorization is terminated or revoked.  Performed at [facility], [address], [place], [place] [address]          Radiology Studies: No results found.      Scheduled Meds:  apixaban  5 mg Oral BID   cholecalciferol  2,000 Units Oral Daily   furosemide  80 mg Intravenous BID   insulin aspart  0-15 Units Subcutaneous TID WC   insulin aspart  0-5 Units Subcutaneous QHS   latanoprost  1 drop Both Eyes QHS   [START ON 05/24/2021] metolazone  5 mg Oral Daily   metoprolol succinate  50 mg Oral Daily   multivitamin with minerals  1 tablet Oral Daily   potassium chloride  40 mEq Oral Daily   Continuous Infusions:  Assessment & Plan:   Principal Problem:   Volume overload Active Problems:   Chronic venous insufficiency   Atrial fibrillation (HCC)   Type 2 diabetes mellitus (HCC)   PAD (peripheral artery disease) (HCC)   Stage 3b chronic kidney disease (HCC)   Pleural effusion   Acute  on chronic diastolic CHF (congestive heart failure) (HCC)   Obesity, Class III, BMI 40-49.9 (morbid obesity) ([place])   Chronic anticoagulation   Anasarca   Karen Farley is a 75 y.o. female with medical history significant for HFpEF (EF 60-65% on echo 11/2019), atrial fibrillation on Eliquis, essential hypertension, hyperlipidemia, chronic lymphedema, chronic venous insufficiency, PAD, type 2 diabetes, CKD stage IIIb is admitted for heart failure exacerbation.  She was recently hospitalized in March 22, 2001 2 in the setting of hypotension and acute kidney injury requiring reduction in diuretic dose of torsemide 20 mg 3 times a week.  She continued to get more lower extremity swelling, weight gain and increase in abdominal girth.   Anasarca Acute on chronic diastolic CHF Left Pleural effusion - Patient with fluid  retention not responding to outpatient increase in torsemide, - CXRon admisison with left pleural effusion -moderate 8/22-clinically improving but still has volume overload Cardiology following Continue IV Lasix 80 mg IV twice daily Start metolazone 5 mg daily Monitor electrolytes and renal function   Permanent atrial fibrillation (HCC)   Chronic anticoagulation Continue heart rate control with Toprol-XL continue Eliquis May pursue DC cardioversion as outpatient to help with edema and appropriate diuresing       Type 2 diabetes mellitus ([place]) BG overall stable Hemoglobin A1c in March was 7.3 Continue R-ISS      Stage 3b chronic kidney disease ([place]) Renal function at baseline monitor closely on diuresing     Obesity, Class III, BMI 40-49.9 (morbid obesity) ([place]) - Complicating factor to overall prognosis and care     DVT prophylaxis: Eliquis Code Status: Full Family Communication: None at bedside Disposition Plan:  Status is: Inpatient  Remains inpatient appropriate because:Inpatient level of care appropriate due to severity of illness  Dispo: The patient is from: Home              Anticipated d/c is to: Home              Patient currently is not medically stable to d/c.   Difficult to place patient No            LOS: 4 days   Time spent: 35 minutes with more than 50% on [place], MD [facility] Pager [phone]  If 7PM-7AM, please contact night-coverage 05/23/2021, 12:54 PM

## 2021-05-24 ENCOUNTER — Ambulatory Visit: Payer: Medicare PPO | Admitting: Family Medicine

## 2021-05-24 DIAGNOSIS — I890 Lymphedema, not elsewhere classified: Secondary | ICD-10-CM

## 2021-05-24 DIAGNOSIS — I872 Venous insufficiency (chronic) (peripheral): Secondary | ICD-10-CM | POA: Diagnosis not present

## 2021-05-24 DIAGNOSIS — R601 Generalized edema: Secondary | ICD-10-CM | POA: Diagnosis not present

## 2021-05-24 DIAGNOSIS — E1159 Type 2 diabetes mellitus with other circulatory complications: Secondary | ICD-10-CM

## 2021-05-24 DIAGNOSIS — E8770 Fluid overload, unspecified: Secondary | ICD-10-CM | POA: Diagnosis not present

## 2021-05-24 DIAGNOSIS — I4819 Other persistent atrial fibrillation: Secondary | ICD-10-CM | POA: Diagnosis not present

## 2021-05-24 DIAGNOSIS — I5033 Acute on chronic diastolic (congestive) heart failure: Secondary | ICD-10-CM | POA: Diagnosis not present

## 2021-05-24 DIAGNOSIS — N1832 Chronic kidney disease, stage 3b: Secondary | ICD-10-CM

## 2021-05-24 DIAGNOSIS — J90 Pleural effusion, not elsewhere classified: Secondary | ICD-10-CM | POA: Diagnosis not present

## 2021-05-24 LAB — GLUCOSE, CAPILLARY
Glucose-Capillary: 147 mg/dL — ABNORMAL HIGH (ref 70–99)
Glucose-Capillary: 157 mg/dL — ABNORMAL HIGH (ref 70–99)
Glucose-Capillary: 141 mg/dL — ABNORMAL HIGH (ref 70–99)

## 2021-05-24 NOTE — Progress Notes (Signed)
Progress Note  Patient Name: Karen Farley Date of Encounter: 05/24/2021  Primary Cardiologist: Agbor-Etang  Subjective   No chest pain.  Dyspnea improving.  Documented urine output 3.9 L for the past 24 hours with a net -10.8 L for the admission.  Weight trend of 101.9 to 100.3 kg over the past 24 hours.  This is with using bed scale on 8/3 and standing scale this morning.  Renal function largely stable on labs yesterday.  Inpatient Medications    Scheduled Meds:  acidophilus  1 capsule Oral TID   apixaban  5 mg Oral BID   cephALEXin  500 mg Oral Q8H   cholecalciferol  2,000 Units Oral Daily   furosemide  80 mg Intravenous BID   insulin aspart  0-15 Units Subcutaneous TID WC   insulin aspart  0-5 Units Subcutaneous QHS   latanoprost  1 drop Both Eyes QHS   metolazone  5 mg Oral Daily   metoprolol succinate  50 mg Oral Daily   multivitamin with minerals  1 tablet Oral Daily   potassium chloride  40 mEq Oral Daily   Continuous Infusions:  PRN Meds: acetaminophen **OR** acetaminophen, ondansetron **OR** ondansetron (ZOFRAN) IV   Vital Signs    Vitals:   05/23/21 1629 05/23/21 2150 05/24/21 0500 05/24/21 0736  BP: 121/74 135/74  (!) 116/95  Pulse: 72 72  87  Resp: 18 16  17   Temp: 97.6 F (36.4 C) 97.7 F (36.5 C)  97.8 F (36.6 C)  TempSrc:  Oral    SpO2: 97% 96%  97%  Weight:   100.3 kg   Height:        Intake/Output Summary (Last 24 hours) at 05/24/2021 1056 Last data filed at 05/24/2021 0950 Gross per 24 hour  Intake 720 ml  Output 4650 ml  Net -3930 ml   Filed Weights   05/22/21 1612 05/23/21 0504 05/24/21 0500  Weight: 102.3 kg 101.9 kg 100.3 kg    Telemetry    A. Fib - Personally Reviewed  ECG    No new tracings - Personally Reviewed  Physical Exam   GEN: No acute distress.   Neck: JVD elevated ~ 10 cm. Cardiac: IRIR, no murmurs, rubs, or gallops.  Respiratory: Mildly diminished breath sounds bilaterally.  GI: Soft, nontender,  non-distended.   MS: Lymph edema noted; No deformity. Neuro:  Alert and oriented x 3; Nonfocal.  Psych: Normal affect.  Labs    Chemistry Recent Labs  Lab 05/21/21 0512 05/22/21 1116 05/23/21 0423  NA 141 136 136  K 3.4* 3.8 3.6  CL 97* 97* 96*  CO2 32 32 31  GLUCOSE 137* 182* 136*  BUN 33* 30* 31*  CREATININE 1.40* 1.41* 1.45*  CALCIUM 9.3 9.1 9.0  GFRNONAA 39* 39* 38*  ANIONGAP 12 7 9      Hematology Recent Labs  Lab 05/18/21 0643 05/19/21 0500  WBC 7.6 6.3  RBC 4.68 4.48  HGB 13.1 12.6  HCT 41.8 39.4  MCV 89.3 87.9  MCH 28.0 28.1  MCHC 31.3 32.0  RDW 16.5* 16.3*  PLT 314 276    Cardiac EnzymesNo results for input(s): TROPONINI in the last 168 hours. No results for input(s): TROPIPOC in the last 168 hours.   BNPNo results for input(s): BNP, PROBNP in the last 168 hours.   DDimer No results for input(s): DDIMER in the last 168 hours.   Radiology    No results found.  Cardiac Studies   2D echo 04/2021:  1. Left ventricular ejection fraction, by estimation, is 60 to 65%. The  left ventricle has normal function. The left ventricle has no regional  wall motion abnormalities. Left ventricular diastolic parameters are  indeterminate.   2. Right ventricular systolic function is normal. The right ventricular  size is normal. Tricuspid regurgitation signal is inadequate for assessing  PA pressure.   3. Left atrial size was mild to moderately dilated.   4. Left pleural effusion noted . __________  2D echo 11/2019:  1. Left ventricular ejection fraction, by estimation, is 60 to 65%. The  left ventricle has normal function. The left ventrical has no regional  wall motion abnormalities. Left ventricular diastolic parameters are  indeterminate.   2. Right ventricular systolic function is normal. The right ventricular  size is normal.   3. Left atrial size was moderately dilated. No left atrial/left atrial  appendage thrombus was detected.   4. The mitral valve  is degenerative. no evidence of mitral valve  regurgitation.   5. The aortic valve was not well visualized. Aortic valve regurgitation  is not visualized.   6. Pulmonic valve regurgitation not assessed.  Patient Profile     75 y.o. female with history of HFpEF, permanent A. fib, CKD stage III, lymphedema, HTN, HLD, and obesity who was admitted with acute on chronic HFpEF and a moderate left-sided pleural effusion status postthoracentesis with 400 mL of fluid removed  Assessment & Plan    1.  Acute on chronic HFpEF with a moderate size left pleural effusion status postthoracentesis: -Volume status is improving -Continue IV Lasix 80 mg twice daily and metolazone -Path review of pleural fluid negative for malignancy -Pending renal function, could consider addition of MRA or Jardiance -CHF education -Strict I's and O's -Daily standing scale weights  2.  Permanent A. fib: -Ventricular rates well controlled -Continue Toprol-XL -CHA2DS2-VASc at least 5 (CHF, HTN, age x2, sex category) -Continue Eliquis, therapy was interrupted during this admission for thoracentesis with DOAC being resumed on 8/1 -No plans for inpatient DCCV given chronicity of A. fib dates back at least past 11/16/2019, and in the context of DOAC interruption  -Primary cardiologist considering DCCV as an outpatient to assist with edema following diuresis -May need antiarrhythmic in order to maintain sinus rhythm given a left atrial diameter of 54 mm, this can be addressed in the outpatient setting  3.  Lymphedema: -She has brought her lymphedema pumps from home  4.  CKD stage IIIb: -Monitor with diuresis -Nephrology following   For questions or updates, please contact [place] Please consult [email or web address] for contact info under Cardiology/STEMI.    Signed, Christell Faith, PA-C [facility] Pager: [phone] 05/24/2021, 10:56 AM

## 2021-05-24 NOTE — Progress Notes (Addendum)
PROGRESS NOTE    Karen Farley  BOF:[id number] DOB: 07-Sep-1946 DOA: 05/17/2021 PCP: Valerie Roys, DO    Brief Narrative:   Karen Farley is a 75 year old woman with acute on chronic HFpEF and permanent atrial fibrillation  05/23/2021 feels better but feels like she is not urinating as much as she is getting IV Lasix.  Still not at baseline. 8/4 patient feeling better.  Still with lower extremity edema.  Reports urine output better.  Also discussed with patient to use her home lymphedema pump 4 times a day for 1 hour.  Discussed this with nursing 2.   Consultants:  Cardiology With history nephrology Procedures:   Antimicrobials:      Subjective: No chest pain or dizziness  Objective: Vitals:   05/23/21 2150 05/24/21 0500 05/24/21 0736 05/24/21 1115  BP: 135/74  (!) 116/95 130/89  Pulse: 72  87 89  Resp: 16  17   Temp: 97.7 F (36.5 C)  97.8 F (36.6 C) 97.9 F (36.6 C)  TempSrc: Oral  Oral Oral  SpO2: 96%  97% 96%  Weight:  100.3 kg    Height:        Intake/Output Summary (Last 24 hours) at 05/24/2021 1437 Last data filed at 05/24/2021 1340 Gross per 24 hour  Intake 720 ml  Output 4850 ml  Net -4130 ml   Filed Weights   05/22/21 1612 05/23/21 0504 05/24/21 0500  Weight: 102.3 kg 101.9 kg 100.3 kg    Examination:  Calm, NAD Decreased breath sounds at bases Irregular/irregular S1-S2 Soft benign positive bowel sounds +edema Rt finger no erythema mild swelling    Data Reviewed: I have personally reviewed following labs and imaging studies  CBC: Recent Labs  Lab 05/18/21 0643 05/19/21 0500  WBC 7.6 6.3  HGB 13.1 12.6  HCT 41.8 39.4  MCV 89.3 87.9  PLT 314 778   Basic Metabolic Panel: Recent Labs  Lab 05/19/21 0500 05/20/21 1004 05/21/21 0512 05/22/21 1116 05/23/21 0423  NA 138 138 141 136 136  K 3.4* 4.0 3.4* 3.8 3.6  CL 95* 98 97* 97* 96*  CO2 29 31 32 32 31  GLUCOSE 132* 188* 137* 182* 136*  BUN 34* 32* 33* 30* 31*  CREATININE  1.52* 1.56* 1.40* 1.41* 1.45*  CALCIUM 9.0 9.5 9.3 9.1 9.0   GFR: Estimated Creatinine Clearance: 37.2 mL/min (A) (by C-G formula based on SCr of 1.45 mg/dL (H)). Liver Function Tests: No results for input(s): AST, ALT, ALKPHOS, BILITOT, PROT, ALBUMIN in the last 168 hours. No results for input(s): LIPASE, AMYLASE in the last 168 hours. No results for input(s): AMMONIA in the last 168 hours. Coagulation Profile: No results for input(s): INR, PROTIME in the last 168 hours. Cardiac Enzymes: No results for input(s): CKTOTAL, CKMB, CKMBINDEX, TROPONINI in the last 168 hours. BNP (last 3 results) No results for input(s): PROBNP in the last 8760 hours. HbA1C: No results for input(s): HGBA1C in the last 72 hours. CBG: Recent Labs  Lab 05/23/21 0749 05/23/21 1135 05/23/21 1628 05/23/21 2151 05/24/21 0736  GLUCAP 133* 227* 147* 146* 147*   Lipid Profile: No results for input(s): CHOL, HDL, LDLCALC, TRIG, CHOLHDL, LDLDIRECT in the last 72 hours. Thyroid Function Tests: No results for input(s): TSH, T4TOTAL, FREET4, T3FREE, THYROIDAB in the last 72 hours. Anemia Panel: No results for input(s): VITAMINB12, FOLATE, FERRITIN, TIBC, IRON, RETICCTPCT in the last 72 hours. Sepsis Labs: No results for input(s): PROCALCITON, LATICACIDVEN in the last 168 hours.   Recent  Results (from the past 240 hour(s))  Resp Panel by RT-PCR (Flu A&B, Covid) Nasopharyngeal Swab     Status: None   Collection Time: 05/17/21  5:20 AM   Specimen: Nasopharyngeal Swab; Nasopharyngeal(NP) swabs in vial transport medium  Result Value Ref Range Status   SARS Coronavirus 2 by RT PCR NEGATIVE NEGATIVE Final    Comment: (NOTE) SARS-CoV-2 target nucleic acids are NOT DETECTED.  The SARS-CoV-2 RNA is generally detectable in upper respiratory specimens during the acute phase of infection. The lowest concentration of SARS-CoV-2 viral copies this assay can detect is 138 copies/mL. A negative result does not preclude  SARS-Cov-2 infection and should not be used as the sole basis for treatment or other patient management decisions. A negative result may occur with  improper specimen collection/handling, submission of specimen other than nasopharyngeal swab, presence of viral mutation(s) within the areas targeted by this assay, and inadequate number of viral copies(<138 copies/mL). A negative result must be combined with clinical observations, patient history, and epidemiological information. The expected result is Negative.  Fact Sheet for Patients:  EntrepreneurPulse.com.au  Fact Sheet for Healthcare Providers:  IncredibleEmployment.be  This test is no t yet approved or cleared by the [place] FDA and  has been authorized for detection and/or diagnosis of SARS-CoV-2 by FDA under an Emergency Use Authorization (EUA). This EUA will remain  in effect (meaning this test can be used) for the duration of the COVID-19 declaration under Section 564(b)(1) of the Act, 21 U.S.C.section 360bbb-3(b)(1), unless the authorization is terminated  or revoked sooner.       Influenza A by PCR NEGATIVE NEGATIVE Final   Influenza B by PCR NEGATIVE NEGATIVE Final    Comment: (NOTE) The Xpert Xpress SARS-CoV-2/FLU/RSV plus assay is intended as an aid in the diagnosis of influenza from Nasopharyngeal swab specimens and should not be used as a sole basis for treatment. Nasal washings and aspirates are unacceptable for Xpert Xpress SARS-CoV-2/FLU/RSV testing.  Fact Sheet for Patients: EntrepreneurPulse.com.au  Fact Sheet for Healthcare Providers: IncredibleEmployment.be  This test is not yet approved or cleared by the [place] FDA and has been authorized for detection and/or diagnosis of SARS-CoV-2 by FDA under an Emergency Use Authorization (EUA). This EUA will remain in effect (meaning this test can be used) for the duration of  the COVID-19 declaration under Section 564(b)(1) of the Act, 21 U.S.C. section 360bbb-3(b)(1), unless the authorization is terminated or revoked.  Performed at [facility], [address], [place], [place] [address]          Radiology Studies: No results found.      Scheduled Meds:  acidophilus  1 capsule Oral TID   apixaban  5 mg Oral BID   cephALEXin  500 mg Oral Q8H   cholecalciferol  2,000 Units Oral Daily   furosemide  80 mg Intravenous BID   insulin aspart  0-15 Units Subcutaneous TID WC   insulin aspart  0-5 Units Subcutaneous QHS   latanoprost  1 drop Both Eyes QHS   metolazone  5 mg Oral Daily   metoprolol succinate  50 mg Oral Daily   multivitamin with minerals  1 tablet Oral Daily   potassium chloride  40 mEq Oral Daily   Continuous Infusions:  Assessment & Plan:   Principal Problem:   Volume overload Active Problems:   Chronic venous insufficiency   Atrial fibrillation (HCC)   Type 2 diabetes mellitus (HCC)   PAD (peripheral artery disease) ([place])   Stage  3b chronic kidney disease (HCC)   Pleural effusion   Acute on chronic diastolic CHF (congestive heart failure) (HCC)   Obesity, Class III, BMI 40-49.9 (morbid obesity) ([place])   Chronic anticoagulation   Anasarca   Karen Farley is a 75 y.o. female with medical history significant for HFpEF (EF 60-65% on echo 11/2019), atrial fibrillation on Eliquis, essential hypertension, hyperlipidemia, chronic lymphedema, chronic venous insufficiency, PAD, type 2 diabetes, CKD stage IIIb is admitted for heart failure exacerbation.  She was recently hospitalized in March 22, 2001 2 in the setting of hypotension and acute kidney injury requiring reduction in diuretic dose of torsemide 20 mg 3 times a week.  She continued to get more lower extremity swelling, weight gain and increase in abdominal girth.   Anasarca Acute on chronic diastolic CHF Left Pleural effusion - Patient with fluid retention not  responding to outpatient increase in torsemide, - CXRon admisison with left pleural effusion -moderate 8/4 still volume overloaded Continue IV Lasix and metolazone Cardiology following Monitor electrolytes    Permanent atrial fibrillation (HCC)   Chronic anticoagulation Continue Toprol-XL and Eliquis        Type 2 diabetes mellitus (HCC) BG stable A1c in March 7 0.3 Continue R-ISS       Stage 3b chronic kidney disease ([place]) Stabilizing on current diuretic regimen.  Nephrology on board Continue to monitor     Obesity, Class III, BMI 40-49.9 (morbid obesity) (HCC) - Complicating factor to overall prognosis and care   Rt iindex finger-mildly red and swollen from fingersticks being check on the finger questionable cellulitis Will start Keflex empirically  DVT prophylaxis: Eliquis Code Status: Full Family Communication: None at bedside Disposition Plan:  Status is: Inpatient  Remains inpatient appropriate because:Inpatient level of care appropriate due to severity of illness  Dispo: The patient is from: Home              Anticipated d/c is to: Home              Patient currently is not medically stable to d/c.   Difficult to place patient No            LOS: 5 days   Time spent: 35 minutes with more than 50% on [place], MD [facility] Pager [phone]  If 7PM-7AM, please contact night-coverage

## 2021-05-24 NOTE — Progress Notes (Signed)
[facility]  ROUNDING NOTE   Subjective:   Karen Farley is a 75 y.o. female with past medical history of diastolic CHF, diabetes, lymphedema, Atrial fib on Eliquis and CKD 3B. She presents to the ED with increased lower extremity edema and shortness of breath. She has been admitted for Anasarca [R60.1] Peripheral edema [R60.9] Pleural effusion [J90] Volume overload [E87.70]   Patient is seen in our office by Dr Candiss Norse. She had a recent admission for hypotension and AKI due to diuretic intolerance.   Patient seen at side of bed Alert and oriented Tolerating meals Denies shortness of breath Patient seen later sitting in chair with lymphedema pumps on She feels she is doing better and voiding appropriately   UOP 4419ml in last 24 hours   Objective:  Vital signs in last 24 hours:  Temp:  [97.6 F (36.4 C)-97.8 F (36.6 C)] 97.8 F (36.6 C) (08/04 0736) Pulse Rate:  [72-87] 87 (08/04 0736) Resp:  [16-18] 17 (08/04 0736) BP: (116-135)/(74-95) 116/95 (08/04 0736) SpO2:  [96 %-97 %] 97 % (08/04 0736) Weight:  [100.3 kg] 100.3 kg (08/04 0500)  Weight change: -2.041 kg Filed Weights   05/22/21 1612 05/23/21 0504 05/24/21 0500  Weight: 102.3 kg 101.9 kg 100.3 kg    Intake/Output: I/O last 3 completed shifts: In: 480 [P.O.:480] Out: 5750 [Urine:5750]   Intake/Output this shift:  Total I/O In: 480 [P.O.:480] Out: 500 [Urine:500]  Physical Exam: General: NAD, sitting up in chair  Head: Normocephalic, atraumatic. Moist oral mucosal membranes  Eyes: Anicteric  Lungs:  Diminished in bases, normal effort  Heart: Regular rate and rhythm  Abdomen:  Soft, nontender, distended  Extremities:  2+ peripheral edema.abdominal wall edema  Neurologic: Alert, moving all four extremities  Skin: No lesions       Basic Metabolic Panel: Recent Labs  Lab 05/19/21 0500 05/20/21 1004 05/21/21 0512 05/22/21 1116 05/23/21 0423  NA 138 138 141 136 136  K 3.4* 4.0  3.4* 3.8 3.6  CL 95* 98 97* 97* 96*  CO2 29 31 32 32 31  GLUCOSE 132* 188* 137* 182* 136*  BUN 34* 32* 33* 30* 31*  CREATININE 1.52* 1.56* 1.40* 1.41* 1.45*  CALCIUM 9.0 9.5 9.3 9.1 9.0     Liver Function Tests: No results for input(s): AST, ALT, ALKPHOS, BILITOT, PROT, ALBUMIN in the last 168 hours. No results for input(s): LIPASE, AMYLASE in the last 168 hours. No results for input(s): AMMONIA in the last 168 hours.  CBC: Recent Labs  Lab 05/18/21 0643 05/19/21 0500  WBC 7.6 6.3  HGB 13.1 12.6  HCT 41.8 39.4  MCV 89.3 87.9  PLT 314 276     Cardiac Enzymes: No results for input(s): CKTOTAL, CKMB, CKMBINDEX, TROPONINI in the last 168 hours.  BNP: Invalid input(s): POCBNP  CBG: Recent Labs  Lab 05/23/21 0749 05/23/21 1135 05/23/21 1628 05/23/21 2151 05/24/21 0736  GLUCAP 133* 227* 147* 146* 147*     Microbiology: Results for orders placed or performed during the hospital encounter of 05/17/21  Resp Panel by RT-PCR (Flu A&B, Covid) Nasopharyngeal Swab     Status: None   Collection Time: 05/17/21  5:20 AM   Specimen: Nasopharyngeal Swab; Nasopharyngeal(NP) swabs in vial transport medium  Result Value Ref Range Status   SARS Coronavirus 2 by RT PCR NEGATIVE NEGATIVE Final    Comment: (NOTE) SARS-CoV-2 target nucleic acids are NOT DETECTED.  The SARS-CoV-2 RNA is generally detectable in upper respiratory specimens during the acute  phase of infection. The lowest concentration of SARS-CoV-2 viral copies this assay can detect is 138 copies/mL. A negative result does not preclude SARS-Cov-2 infection and should not be used as the sole basis for treatment or other patient management decisions. A negative result may occur with  improper specimen collection/handling, submission of specimen other than nasopharyngeal swab, presence of viral mutation(s) within the areas targeted by this assay, and inadequate number of viral copies(<138 copies/mL). A negative result  must be combined with clinical observations, patient history, and epidemiological information. The expected result is Negative.  Fact Sheet for Patients:  EntrepreneurPulse.com.au  Fact Sheet for Healthcare Providers:  IncredibleEmployment.be  This test is no t yet approved or cleared by the [place] FDA and  has been authorized for detection and/or diagnosis of SARS-CoV-2 by FDA under an Emergency Use Authorization (EUA). This EUA will remain  in effect (meaning this test can be used) for the duration of the COVID-19 declaration under Section 564(b)(1) of the Act, 21 U.S.C.section 360bbb-3(b)(1), unless the authorization is terminated  or revoked sooner.       Influenza A by PCR NEGATIVE NEGATIVE Final   Influenza B by PCR NEGATIVE NEGATIVE Final    Comment: (NOTE) The Xpert Xpress SARS-CoV-2/FLU/RSV plus assay is intended as an aid in the diagnosis of influenza from Nasopharyngeal swab specimens and should not be used as a sole basis for treatment. Nasal washings and aspirates are unacceptable for Xpert Xpress SARS-CoV-2/FLU/RSV testing.  Fact Sheet for Patients: EntrepreneurPulse.com.au  Fact Sheet for Healthcare Providers: IncredibleEmployment.be  This test is not yet approved or cleared by the [place] FDA and has been authorized for detection and/or diagnosis of SARS-CoV-2 by FDA under an Emergency Use Authorization (EUA). This EUA will remain in effect (meaning this test can be used) for the duration of the COVID-19 declaration under Section 564(b)(1) of the Act, 21 U.S.C. section 360bbb-3(b)(1), unless the authorization is terminated or revoked.  Performed at [facility], [address] [place], [place] [address]     Coagulation Studies: No results for input(s): LABPROT, INR in the last 72 hours.  Urinalysis: No results for input(s): COLORURINE, LABSPEC, PHURINE,  GLUCOSEU, HGBUR, BILIRUBINUR, KETONESUR, PROTEINUR, UROBILINOGEN, NITRITE, LEUKOCYTESUR in the last 72 hours.  Invalid input(s): APPERANCEUR    Imaging: No results found.   Medications:     acidophilus  1 capsule Oral TID   apixaban  5 mg Oral BID   cephALEXin  500 mg Oral Q8H   cholecalciferol  2,000 Units Oral Daily   furosemide  80 mg Intravenous BID   insulin aspart  0-15 Units Subcutaneous TID WC   insulin aspart  0-5 Units Subcutaneous QHS   latanoprost  1 drop Both Eyes QHS   metolazone  5 mg Oral Daily   metoprolol succinate  50 mg Oral Daily   multivitamin with minerals  1 tablet Oral Daily   potassium chloride  40 mEq Oral Daily   acetaminophen **OR** acetaminophen, ondansetron **OR** ondansetron (ZOFRAN) IV  Assessment/ Plan:  Karen Farley is a 75 y.o.  female past medical history of diastolic CHF, diabetes, lymphedema, Atrial fib on Eliquis and CKD 3B. She presents to the ED with increased lower extremity edema and shortness of breath. She has been admitted for Anasarca [R60.1] Peripheral edema [R60.9] Pleural effusion [J90] Volume overload [E87.70]   Chronic Kidney disease 3B Followed by [facility] Dr Candiss Norse. Recent admission for AKI. Discharged on Torsemide 20mg  TID. Furosemide 80mg  twice daily and  Metolazone 5mg  added yesterday. UOP adequate. Edema improving. Continue lymphedema pumps at recommended TID schedule. Will continue to monitor  Lab Results  Component Value Date   CREATININE 1.45 (H) 05/23/2021   CREATININE 1.41 (H) 05/22/2021   CREATININE 1.40 (H) 05/21/2021    Intake/Output Summary (Last 24 hours) at 05/24/2021 1230 Last data filed at 05/24/2021 0950 Gross per 24 hour  Intake 720 ml  Output 4050 ml  Net -3330 ml    2. .Diabetes mellitus type II with chronic kidney disease  noninsulin dependent. Most recent hemoglobin A1c is 6.6 on 05/17/21. Glucose stable. Primary team to manage SSI.  3.diastolic Heart Failure ECHO on 04/27/21 shows EF  60-65% Furosemide 80mg  BID Metolazone 5mg  daily Cardiology following    LOS: 5 Jahziah Simonin 8/4/202212:30 PM

## 2021-05-24 NOTE — Care Management Important Message (Signed)
Important Message  Patient Details  Name: Karen Farley MRN: [id number] Date of Birth: August 08, 1946   Medicare Important Message Given:  Yes     Dannette Barbara 05/24/2021, 1:42 PM

## 2021-05-25 ENCOUNTER — Encounter: Payer: Medicare PPO | Admitting: Family Medicine

## 2021-05-25 DIAGNOSIS — Z7901 Long term (current) use of anticoagulants: Secondary | ICD-10-CM

## 2021-05-25 DIAGNOSIS — I4819 Other persistent atrial fibrillation: Secondary | ICD-10-CM | POA: Diagnosis not present

## 2021-05-25 DIAGNOSIS — R601 Generalized edema: Secondary | ICD-10-CM | POA: Diagnosis not present

## 2021-05-25 DIAGNOSIS — I5033 Acute on chronic diastolic (congestive) heart failure: Secondary | ICD-10-CM | POA: Diagnosis not present

## 2021-05-25 DIAGNOSIS — E8770 Fluid overload, unspecified: Secondary | ICD-10-CM | POA: Diagnosis not present

## 2021-05-25 LAB — BASIC METABOLIC PANEL
Anion gap: 15 (ref 5–15)
BUN: 41 mg/dL — ABNORMAL HIGH (ref 8–23)
CO2: 33 mmol/L — ABNORMAL HIGH (ref 22–32)
Calcium: 9.4 mg/dL (ref 8.9–10.3)
Chloride: 88 mmol/L — ABNORMAL LOW (ref 98–111)
Creatinine, Ser: 1.54 mg/dL — ABNORMAL HIGH (ref 0.44–1.00)
GFR, Estimated: 35 mL/min — ABNORMAL LOW (ref >60)
Glucose, Bld: 135 mg/dL — ABNORMAL HIGH (ref 70–99)
Potassium: 2.5 mmol/L — CL (ref 3.5–5.1)
Sodium: 136 mmol/L (ref 135–145)

## 2021-05-25 LAB — GLUCOSE, CAPILLARY
Glucose-Capillary: 149 mg/dL — ABNORMAL HIGH (ref 70–99)
Glucose-Capillary: 149 mg/dL — ABNORMAL HIGH (ref 70–99)
Glucose-Capillary: 151 mg/dL — ABNORMAL HIGH (ref 70–99)
Glucose-Capillary: 197 mg/dL — ABNORMAL HIGH (ref 70–99)

## 2021-05-25 MED ORDER — POTASSIUM CHLORIDE 10 MEQ/100ML IV SOLN
10.0000 meq | INTRAVENOUS | Status: AC
Start: 2021-05-25 — End: 2021-05-25
  Administered 2021-05-25 (×3): 10 meq via INTRAVENOUS
  Filled 2021-05-25 (×3): qty 100

## 2021-05-25 MED ORDER — CEPHALEXIN 500 MG PO CAPS
500.0000 mg | ORAL_CAPSULE | Freq: Four times a day (QID) | ORAL | Status: DC
  Administered 2021-05-25 – 2021-05-27 (×9): 500 mg via ORAL
  Filled 2021-05-25 (×9): qty 1

## 2021-05-25 MED ORDER — SPIRONOLACTONE 25 MG PO TABS
25.0000 mg | ORAL_TABLET | Freq: Every day | ORAL | Status: DC
  Administered 2021-05-25 – 2021-05-27 (×3): 25 mg via ORAL
  Filled 2021-05-25 (×3): qty 1

## 2021-05-25 NOTE — Progress Notes (Signed)
PROGRESS NOTE    Karen Farley  ZOX:[id number] DOB: 1946-03-07 DOA: 05/17/2021 PCP: Valerie Roys, DO    Brief Narrative:   [name] is a 75 year old woman with acute on chronic HFpEF and permanent atrial fibrillation  05/23/2021 feels better but feels like she is not urinating as much as she is getting IV Lasix.  Still not at baseline. 8/4 patient feeling better.  Still with lower extremity edema.  Reports urine output better.  Also discussed with patient to use her home lymphedema pump 4 times a day for 1 hour.  Discussed this with nursing 2.  8/5 wearing her lymphedema pump this am.    Consultants:  Cardiology With history nephrology Procedures:   Antimicrobials:      Subjective: Sob improving, not at baseline. No cp  Objective: Vitals:   05/24/21 2230 05/25/21 0445 05/25/21 0554 05/25/21 0755  BP: 118/74 (!) 97/51 132/69 102/86  Pulse: 66 82 85 84  Resp:  18  17  Temp:  98 F (36.7 C)  97.8 F (36.6 C)  TempSrc:  Oral    SpO2:  95%  96%  Weight:   103 kg   Height:        Intake/Output Summary (Last 24 hours) at 05/25/2021 0814 Last data filed at 05/25/2021 0700 Gross per 24 hour  Intake 1920 ml  Output 5000 ml  Net -3080 ml   Filed Weights   05/23/21 0504 05/24/21 0500 05/25/21 0554  Weight: 101.9 kg 100.3 kg 103 kg    Examination: Calm, nad Decrease bs, no wheezing Reg-irr s1/s2 no gallop Soft benign +bs Pump in place [id number]   Data Reviewed: I have personally reviewed following labs and imaging studies  CBC: Recent Labs  Lab 05/19/21 0500  WBC 6.3  HGB 12.6  HCT 39.4  MCV 87.9  PLT 811   Basic Metabolic Panel: Recent Labs  Lab 05/20/21 1004 05/21/21 0512 05/22/21 1116 05/23/21 0423 05/25/21 0449  NA 138 141 136 136 136  K 4.0 3.4* 3.8 3.6 2.5*  CL 98 97* 97* 96* 88*  CO2 31 32 32 31 33*  GLUCOSE 188* 137* 182* 136* 135*  BUN 32* 33* 30* 31* 41*  CREATININE 1.56* 1.40* 1.41* 1.45* 1.54*  CALCIUM 9.5 9.3 9.1 9.0 9.4    GFR: Estimated Creatinine Clearance: 35.5 mL/min (A) (by C-G formula based on SCr of 1.54 mg/dL (H)). Liver Function Tests: No results for input(s): AST, ALT, ALKPHOS, BILITOT, PROT, ALBUMIN in the last 168 hours. No results for input(s): LIPASE, AMYLASE in the last 168 hours. No results for input(s): AMMONIA in the last 168 hours. Coagulation Profile: No results for input(s): INR, PROTIME in the last 168 hours. Cardiac Enzymes: No results for input(s): CKTOTAL, CKMB, CKMBINDEX, TROPONINI in the last 168 hours. BNP (last 3 results) No results for input(s): PROBNP in the last 8760 hours. HbA1C: No results for input(s): HGBA1C in the last 72 hours. CBG: Recent Labs  Lab 05/23/21 2151 05/24/21 0736 05/24/21 1627 05/24/21 2127 05/25/21 0739  GLUCAP 146* 147* 157* 141* 149*   Lipid Profile: No results for input(s): CHOL, HDL, LDLCALC, TRIG, CHOLHDL, LDLDIRECT in the last 72 hours. Thyroid Function Tests: No results for input(s): TSH, T4TOTAL, FREET4, T3FREE, THYROIDAB in the last 72 hours. Anemia Panel: No results for input(s): VITAMINB12, FOLATE, FERRITIN, TIBC, IRON, RETICCTPCT in the last 72 hours. Sepsis Labs: No results for input(s): PROCALCITON, LATICACIDVEN in the last 168 hours.   Recent Results (from the past 240  hour(s))  Resp Panel by RT-PCR (Flu A&B, Covid) Nasopharyngeal Swab     Status: None   Collection Time: 05/17/21  5:20 AM   Specimen: Nasopharyngeal Swab; Nasopharyngeal(NP) swabs in vial transport medium  Result Value Ref Range Status   SARS Coronavirus 2 by RT PCR NEGATIVE NEGATIVE Final    Comment: (NOTE) SARS-CoV-2 target nucleic acids are NOT DETECTED.  The SARS-CoV-2 RNA is generally detectable in upper respiratory specimens during the acute phase of infection. The lowest concentration of SARS-CoV-2 viral copies this assay can detect is 138 copies/mL. A negative result does not preclude SARS-Cov-2 infection and should not be used as the sole basis  for treatment or other patient management decisions. A negative result may occur with  improper specimen collection/handling, submission of specimen other than nasopharyngeal swab, presence of viral mutation(s) within the areas targeted by this assay, and inadequate number of viral copies(<138 copies/mL). A negative result must be combined with clinical observations, patient history, and epidemiological information. The expected result is Negative.  Fact Sheet for Patients:  EntrepreneurPulse.com.au  Fact Sheet for Healthcare Providers:  IncredibleEmployment.be  This test is no t yet approved or cleared by the [place] FDA and  has been authorized for detection and/or diagnosis of SARS-CoV-2 by FDA under an Emergency Use Authorization (EUA). This EUA will remain  in effect (meaning this test can be used) for the duration of the COVID-19 declaration under Section 564(b)(1) of the Act, 21 U.S.C.section 360bbb-3(b)(1), unless the authorization is terminated  or revoked sooner.       Influenza A by PCR NEGATIVE NEGATIVE Final   Influenza B by PCR NEGATIVE NEGATIVE Final    Comment: (NOTE) The Xpert Xpress SARS-CoV-2/FLU/RSV plus assay is intended as an aid in the diagnosis of influenza from Nasopharyngeal swab specimens and should not be used as a sole basis for treatment. Nasal washings and aspirates are unacceptable for Xpert Xpress SARS-CoV-2/FLU/RSV testing.  Fact Sheet for Patients: EntrepreneurPulse.com.au  Fact Sheet for Healthcare Providers: IncredibleEmployment.be  This test is not yet approved or cleared by the [place] FDA and has been authorized for detection and/or diagnosis of SARS-CoV-2 by FDA under an Emergency Use Authorization (EUA). This EUA will remain in effect (meaning this test can be used) for the duration of the COVID-19 declaration under Section 564(b)(1) of the Act, 21  U.S.C. section 360bbb-3(b)(1), unless the authorization is terminated or revoked.  Performed at [facility], [address], [place], [place] [address]          Radiology Studies: No results found.      Scheduled Meds:  acidophilus  1 capsule Oral TID   apixaban  5 mg Oral BID   cephALEXin  500 mg Oral Q8H   cholecalciferol  2,000 Units Oral Daily   furosemide  80 mg Intravenous BID   insulin aspart  0-15 Units Subcutaneous TID WC   insulin aspart  0-5 Units Subcutaneous QHS   latanoprost  1 drop Both Eyes QHS   metolazone  5 mg Oral Daily   metoprolol succinate  50 mg Oral Daily   multivitamin with minerals  1 tablet Oral Daily   potassium chloride  40 mEq Oral Daily   Continuous Infusions:  potassium chloride 10 mEq (05/25/21 0708)    Assessment & Plan:   Principal Problem:   Volume overload Active Problems:   Chronic venous insufficiency   Atrial fibrillation (HCC)   Type 2 diabetes mellitus (HCC)   PAD (peripheral artery disease) (  [place])   Stage 3b chronic kidney disease ([place])   Pleural effusion   Acute on chronic diastolic CHF (congestive heart failure) (HCC)   Obesity, Class III, BMI 40-49.9 (morbid obesity) ([place])   Chronic anticoagulation   Anasarca   Karen Farley is a 75 y.o. female with medical history significant for HFpEF (EF 60-65% on echo 11/2019), atrial fibrillation on Eliquis, essential hypertension, hyperlipidemia, chronic lymphedema, chronic venous insufficiency, PAD, type 2 diabetes, CKD stage IIIb is admitted for heart failure exacerbation.  She was recently hospitalized in March 22, 2001 2 in the setting of hypotension and acute kidney injury requiring reduction in diuretic dose of torsemide 20 mg 3 times a week.  She continued to get more lower extremity swelling, weight gain and increase in abdominal girth.   Anasarca Acute on chronic diastolic CHF Left Pleural effusion - Patient with fluid retention not responding to  outpatient increase in torsemide, - CXRon admisison with left pleural effusion -moderate 8/5-volume status improving Holding metolazone and Lasix since creatinine is up Monitor electrolytes Cardiology following     Permanent atrial fibrillation (HCC)   Chronic anticoagulation Continue Toprol-XL and Eliquis      Type 2 diabetes mellitus ([place]) Continue riss       Stage 3b chronic kidney disease (HCC) Creatinine up mildly , will monitor Hold Lasix and metolazone for now Nephrology following     Obesity, Class III, BMI 40-49.9 (morbid obesity) ([place]) - Complicating factor to overall prognosis and care   Rt iindex finger-mildly red and swollen from fingersticks being check on the finger questionable cellulitis Will start Keflex empirically  DVT prophylaxis: Eliquis Code Status: Full Family Communication: None at bedside Disposition Plan:  Status is: Inpatient  Remains inpatient appropriate because:Inpatient level of care appropriate due to severity of illness  Dispo: The patient is from: Home              Anticipated d/c is to: Home              Patient currently is not medically stable to d/c.   Difficult to place patient No            LOS: 6 days   Time spent: 35 minutes with more than 50% on [place], MD [facility] Pager [phone]  If 7PM-7AM, please contact night-coverage

## 2021-05-25 NOTE — Progress Notes (Signed)
PHARMACY NOTE:  ANTIMICROBIAL RENAL DOSAGE ADJUSTMENT  Current antimicrobial regimen includes a mismatch between antimicrobial dosage and estimated renal function.  As per policy approved by the Pharmacy & Therapeutics and Medical Executive Committees, the antimicrobial dosage will be adjusted accordingly.  Current antimicrobial dosage: cephalexin 500 mg every 8 hours  Indication: cellulitis  Renal Function:   Estimated Creatinine Clearance: 34.7 mL/min (A) (by C-G formula based on SCr of 1.54 mg/dL (H)). []      On intermittent HD, scheduled: []      On CRRT    Antimicrobial dosage has been changed to:  cephalexin 500 mg every 6 hours  Additional comments:   Thank you for allowing pharmacy to be a part of this patient's care.   Wynelle Cleveland, PharmD Pharmacy Resident  05/25/2021 2:00 PM

## 2021-05-25 NOTE — Progress Notes (Signed)
[facility]  ROUNDING NOTE   Subjective:   Karen Farley is a 75 y.o. female with past medical history of diastolic CHF, diabetes, lymphedema, Atrial fib on Eliquis and CKD 3B. She presents to the ED with increased lower extremity edema and shortness of breath. She has been admitted for Anasarca [R60.1] Peripheral edema [R60.9] Pleural effusion [J90] Volume overload [E87.70]   Patient is seen in our office by Dr Candiss Norse. She had a recent admission for hypotension and AKI due to diuretic intolerance.   Patient sitting up in chair with lymphedema pumps on States she doesn't feel well Per nursing, she began feeling lightheaded when IV potassium was started. Nursing at bedside to evaluate and decrease rate  UOP 5.5L in last 24 hours   Objective:  Vital signs in last 24 hours:  Temp:  [97.6 F (36.4 C)-98 F (36.7 C)] 97.8 F (36.6 C) (08/05 0755) Pulse Rate:  [66-92] 84 (08/05 0755) Resp:  [17-18] 17 (08/05 0755) BP: (97-132)/(51-86) 102/86 (08/05 0755) SpO2:  [95 %-97 %] 96 % (08/05 0755) Weight:  [98.8 kg-103 kg] 98.8 kg (08/05 1047)  Weight change: 2.71 kg Filed Weights   05/24/21 0500 05/25/21 0554 05/25/21 1047  Weight: 100.3 kg 103 kg 98.8 kg    Intake/Output: I/O last 3 completed shifts: In: 1920 [P.O.:1920] Out: 7800 [Urine:7800]   Intake/Output this shift:  Total I/O In: -  Out: 300 [Urine:300]  Physical Exam: General: NAD, sitting up in chair  Head: Normocephalic, atraumatic. Moist oral mucosal membranes  Eyes: Anicteric  Lungs:  Diminished in bases, normal effort  Heart: Regular rate and rhythm  Abdomen:  Soft, nontender, distended  Extremities:  2+ peripheral edema.abdominal wall edema (improving)  Neurologic: Alert, moving all four extremities  Skin: No lesions       Basic Metabolic Panel: Recent Labs  Lab 05/20/21 1004 05/21/21 0512 05/22/21 1116 05/23/21 0423 05/25/21 0449  NA 138 141 136 136 136  K 4.0 3.4* 3.8 3.6 2.5*   CL 98 97* 97* 96* 88*  CO2 31 32 32 31 33*  GLUCOSE 188* 137* 182* 136* 135*  BUN 32* 33* 30* 31* 41*  CREATININE 1.56* 1.40* 1.41* 1.45* 1.54*  CALCIUM 9.5 9.3 9.1 9.0 9.4     Liver Function Tests: No results for input(s): AST, ALT, ALKPHOS, BILITOT, PROT, ALBUMIN in the last 168 hours. No results for input(s): LIPASE, AMYLASE in the last 168 hours. No results for input(s): AMMONIA in the last 168 hours.  CBC: Recent Labs  Lab 05/19/21 0500  WBC 6.3  HGB 12.6  HCT 39.4  MCV 87.9  PLT 276     Cardiac Enzymes: No results for input(s): CKTOTAL, CKMB, CKMBINDEX, TROPONINI in the last 168 hours.  BNP: Invalid input(s): POCBNP  CBG: Recent Labs  Lab 05/24/21 0736 05/24/21 1627 05/24/21 2127 05/25/21 0739 05/25/21 1141  GLUCAP 147* 157* 141* 149* 149*     Microbiology: Results for orders placed or performed during the hospital encounter of 05/17/21  Resp Panel by RT-PCR (Flu A&B, Covid) Nasopharyngeal Swab     Status: None   Collection Time: 05/17/21  5:20 AM   Specimen: Nasopharyngeal Swab; Nasopharyngeal(NP) swabs in vial transport medium  Result Value Ref Range Status   SARS Coronavirus 2 by RT PCR NEGATIVE NEGATIVE Final    Comment: (NOTE) SARS-CoV-2 target nucleic acids are NOT DETECTED.  The SARS-CoV-2 RNA is generally detectable in upper respiratory specimens during the acute phase of infection. The lowest concentration of  SARS-CoV-2 viral copies this assay can detect is 138 copies/mL. A negative result does not preclude SARS-Cov-2 infection and should not be used as the sole basis for treatment or other patient management decisions. A negative result may occur with  improper specimen collection/handling, submission of specimen other than nasopharyngeal swab, presence of viral mutation(s) within the areas targeted by this assay, and inadequate number of viral copies(<138 copies/mL). A negative result must be combined with clinical observations,  patient history, and epidemiological information. The expected result is Negative.  Fact Sheet for Patients:  EntrepreneurPulse.com.au  Fact Sheet for Healthcare Providers:  IncredibleEmployment.be  This test is no t yet approved or cleared by the [place] FDA and  has been authorized for detection and/or diagnosis of SARS-CoV-2 by FDA under an Emergency Use Authorization (EUA). This EUA will remain  in effect (meaning this test can be used) for the duration of the COVID-19 declaration under Section 564(b)(1) of the Act, 21 U.S.C.section 360bbb-3(b)(1), unless the authorization is terminated  or revoked sooner.       Influenza A by PCR NEGATIVE NEGATIVE Final   Influenza B by PCR NEGATIVE NEGATIVE Final    Comment: (NOTE) The Xpert Xpress SARS-CoV-2/FLU/RSV plus assay is intended as an aid in the diagnosis of influenza from Nasopharyngeal swab specimens and should not be used as a sole basis for treatment. Nasal washings and aspirates are unacceptable for Xpert Xpress SARS-CoV-2/FLU/RSV testing.  Fact Sheet for Patients: EntrepreneurPulse.com.au  Fact Sheet for Healthcare Providers: IncredibleEmployment.be  This test is not yet approved or cleared by the [place] FDA and has been authorized for detection and/or diagnosis of SARS-CoV-2 by FDA under an Emergency Use Authorization (EUA). This EUA will remain in effect (meaning this test can be used) for the duration of the COVID-19 declaration under Section 564(b)(1) of the Act, 21 U.S.C. section 360bbb-3(b)(1), unless the authorization is terminated or revoked.  Performed at [facility], [address] [place], [place] [address]     Coagulation Studies: No results for input(s): LABPROT, INR in the last 72 hours.  Urinalysis: No results for input(s): COLORURINE, LABSPEC, PHURINE, GLUCOSEU, HGBUR, BILIRUBINUR, KETONESUR,  PROTEINUR, UROBILINOGEN, NITRITE, LEUKOCYTESUR in the last 72 hours.  Invalid input(s): APPERANCEUR    Imaging: No results found.   Medications:     acidophilus  1 capsule Oral TID   apixaban  5 mg Oral BID   cephALEXin  500 mg Oral Q8H   cholecalciferol  2,000 Units Oral Daily   insulin aspart  0-15 Units Subcutaneous TID WC   insulin aspart  0-5 Units Subcutaneous QHS   latanoprost  1 drop Both Eyes QHS   metoprolol succinate  50 mg Oral Daily   multivitamin with minerals  1 tablet Oral Daily   potassium chloride  40 mEq Oral Daily   spironolactone  25 mg Oral Daily   acetaminophen **OR** acetaminophen, ondansetron **OR** ondansetron (ZOFRAN) IV  Assessment/ Plan:  Karen Farley is a 75 y.o.  female past medical history of diastolic CHF, diabetes, lymphedema, Atrial fib on Eliquis and CKD 3B. She presents to the ED with increased lower extremity edema and shortness of breath. She has been admitted for Anasarca [R60.1] Peripheral edema [R60.9] Pleural effusion [J90] Volume overload [E87.70]   Chronic Kidney disease 3B Followed by [facility] Dr Candiss Norse. Recent admission for AKI. Discharged on Torsemide 20mg  TID. Furosemide 80mg  twice daily and Metolazone 5mg . UOP adequate and edema improving. Continue lymphedema pumps at recommended TID schedule. Will  continue to monitor  Lab Results  Component Value Date   CREATININE 1.54 (H) 05/25/2021   CREATININE 1.45 (H) 05/23/2021   CREATININE 1.41 (H) 05/22/2021    Intake/Output Summary (Last 24 hours) at 05/25/2021 1256 Last data filed at 05/25/2021 0852 Gross per 24 hour  Intake 1440 ml  Output 4100 ml  Net -2660 ml    2. .Diabetes mellitus type II with chronic kidney disease  noninsulin dependent. Most recent hemoglobin A1c is 6.6 on 05/17/21. Glucose stable. Primary team to manage SSI.  3.diastolic Heart Failure ECHO on 04/27/21 shows EF 60-65% Furosemide 80mg  BID Metolazone 5mg  daily Will monitor diuretic  usage Cardiology following  4. Hypokalemia 2.5 this am Oral and IV supplementation ordered Will prescribe Spironolactone 25mg  daily.    LOS: 6 Shota Kohrs 8/5/202212:56 PM

## 2021-05-25 NOTE — Progress Notes (Signed)
Progress Note  Patient Name: Karen Farley Date of Encounter: 05/25/2021  Primary Cardiologist: Agbor-Etang  Subjective   No chest pain.  Dyspnea improving.  Documented urine output 3.5 L for the past 24 hours with a net -14.3 L for the admission.  Weight trend of 100.3-->103 kg over the past 24 hours.  Potassium low at 2.5 this morning, receiving IV repletion. Slight bump in renal function over the past 24 hours with a trend of 31/1.45-->41/1.54. IV Lasix and metolazone held this morning with hypokalemia and worsening renal function.   Inpatient Medications    Scheduled Meds:  acidophilus  1 capsule Oral TID   apixaban  5 mg Oral BID   cephALEXin  500 mg Oral Q8H   cholecalciferol  2,000 Units Oral Daily   furosemide  80 mg Intravenous BID   insulin aspart  0-15 Units Subcutaneous TID WC   insulin aspart  0-5 Units Subcutaneous QHS   latanoprost  1 drop Both Eyes QHS   metolazone  5 mg Oral Daily   metoprolol succinate  50 mg Oral Daily   multivitamin with minerals  1 tablet Oral Daily   potassium chloride  40 mEq Oral Daily   Continuous Infusions:  potassium chloride 10 mEq (05/25/21 0708)   PRN Meds: acetaminophen **OR** acetaminophen, ondansetron **OR** ondansetron (ZOFRAN) IV   Vital Signs    Vitals:   05/24/21 2230 05/25/21 0445 05/25/21 0554 05/25/21 0755  BP: 118/74 (!) 97/51 132/69 102/86  Pulse: 66 82 85 84  Resp:  18  17  Temp:  98 F (36.7 C)  97.8 F (36.6 C)  TempSrc:  Oral    SpO2:  95%  96%  Weight:   103 kg   Height:        Intake/Output Summary (Last 24 hours) at 05/25/2021 0817 Last data filed at 05/25/2021 0700 Gross per 24 hour  Intake 1920 ml  Output 5000 ml  Net -3080 ml    Filed Weights   05/23/21 0504 05/24/21 0500 05/25/21 0554  Weight: 101.9 kg 100.3 kg 103 kg    Telemetry    A. Fib - Personally Reviewed  ECG    No new tracings - Personally Reviewed  Physical Exam   GEN: No acute distress.   Neck: JVD mildly  elevated. Cardiac: IRIR, no murmurs, rubs, or gallops.  Respiratory: Mildly diminished breath sounds bilaterally with faint wheezing along the bases.  GI: Soft, nontender, non-distended.   MS: Lymph edema noted; No deformity. Neuro:  Alert and oriented x 3; Nonfocal.  Psych: Normal affect.  Labs    Chemistry Recent Labs  Lab 05/22/21 1116 05/23/21 0423 05/25/21 0449  NA 136 136 136  K 3.8 3.6 2.5*  CL 97* 96* 88*  CO2 32 31 33*  GLUCOSE 182* 136* 135*  BUN 30* 31* 41*  CREATININE 1.41* 1.45* 1.54*  CALCIUM 9.1 9.0 9.4  GFRNONAA 39* 38* 35*  ANIONGAP 7 9 15       Hematology Recent Labs  Lab 05/19/21 0500  WBC 6.3  RBC 4.48  HGB 12.6  HCT 39.4  MCV 87.9  MCH 28.1  MCHC 32.0  RDW 16.3*  PLT 276     Cardiac EnzymesNo results for input(s): TROPONINI in the last 168 hours. No results for input(s): TROPIPOC in the last 168 hours.   BNPNo results for input(s): BNP, PROBNP in the last 168 hours.   DDimer No results for input(s): DDIMER in the last 168 hours.  Radiology    No results found.  Cardiac Studies   2D echo 04/2021: 1. Left ventricular ejection fraction, by estimation, is 60 to 65%. The  left ventricle has normal function. The left ventricle has no regional  wall motion abnormalities. Left ventricular diastolic parameters are  indeterminate.   2. Right ventricular systolic function is normal. The right ventricular  size is normal. Tricuspid regurgitation signal is inadequate for assessing  PA pressure.   3. Left atrial size was mild to moderately dilated.   4. Left pleural effusion noted . __________  2D echo 11/2019:  1. Left ventricular ejection fraction, by estimation, is 60 to 65%. The  left ventricle has normal function. The left ventrical has no regional  wall motion abnormalities. Left ventricular diastolic parameters are  indeterminate.   2. Right ventricular systolic function is normal. The right ventricular  size is normal.   3. Left  atrial size was moderately dilated. No left atrial/left atrial  appendage thrombus was detected.   4. The mitral valve is degenerative. no evidence of mitral valve  regurgitation.   5. The aortic valve was not well visualized. Aortic valve regurgitation  is not visualized.   6. Pulmonic valve regurgitation not assessed.  Patient Profile     75 y.o. female with history of HFpEF, permanent A. fib, CKD stage III, lymphedema, HTN, HLD, and obesity who was admitted with acute on chronic HFpEF and a moderate left-sided pleural effusion status postthoracentesis with 400 mL of fluid removed  Assessment & Plan    1.  Acute on chronic HFpEF with a moderate size left pleural effusion status postthoracentesis: -Volume status is improving -Hold IV Lasix and metolazone this morning with increase in renal function and hypokalemia  -Path review of pleural fluid negative for malignancy -Pending renal function, could consider addition of MRA or Jardiance prior to  discharge or in follow up -Significant fluid intake at home -CHF education -Strict I's and O's -Daily standing scale weights  2.  Permanent A. fib: -Ventricular rates well controlled -Continue Toprol-XL 50 mg -CHA2DS2-VASc at least 5 (CHF, HTN, age x2, sex category) -Continue Eliquis, therapy was interrupted during this admission for thoracentesis with DOAC being resumed on 8/1 -She does not meet reduced dosing criteria of Eliquis -No plans for inpatient DCCV given chronicity of A. fib dates back at least past 11/16/2019, and in the context of DOAC interruption  -Primary cardiologist considering DCCV as an outpatient to assist with edema following diuresis -May need antiarrhythmic in order to maintain sinus rhythm given a left atrial diameter of 54 mm, this can be addressed in the outpatient setting  3.  Lymphedema: -She has brought her lymphedema pumps from home  4.  Acute on CKD stage IIIb: -Hold IV Lasix and metolazone this morning  with mild AKI and hypokalemia requiring IV repletion  -Nephrology following  5. Hypokalemia: -Being IV repleted   For questions or updates, please contact [place] Please consult [email or web address] for contact info under Cardiology/STEMI.    Signed, Christell Faith, PA-C [place] Pager: [phone] 05/25/2021, 8:17 AM

## 2021-05-26 DIAGNOSIS — I5033 Acute on chronic diastolic (congestive) heart failure: Secondary | ICD-10-CM | POA: Diagnosis not present

## 2021-05-26 DIAGNOSIS — E8770 Fluid overload, unspecified: Secondary | ICD-10-CM | POA: Diagnosis not present

## 2021-05-26 DIAGNOSIS — R601 Generalized edema: Secondary | ICD-10-CM | POA: Diagnosis not present

## 2021-05-26 DIAGNOSIS — I4821 Permanent atrial fibrillation: Secondary | ICD-10-CM | POA: Diagnosis not present

## 2021-05-26 LAB — CBC
HCT: 37 % (ref 36.0–46.0)
Hemoglobin: 12 g/dL (ref 12.0–15.0)
MCH: 27.3 pg (ref 26.0–34.0)
MCHC: 32.4 g/dL (ref 30.0–36.0)
MCV: 84.3 fL (ref 80.0–100.0)
Platelets: 277 10*3/uL (ref 150–400)
RBC: 4.39 MIL/uL (ref 3.87–5.11)
RDW: 16.1 % — ABNORMAL HIGH (ref 11.5–15.5)
WBC: 6.3 10*3/uL (ref 4.0–10.5)
nRBC: 0 % (ref 0.0–0.2)

## 2021-05-26 LAB — GLUCOSE, CAPILLARY
Glucose-Capillary: 163 mg/dL — ABNORMAL HIGH (ref 70–99)
Glucose-Capillary: 176 mg/dL — ABNORMAL HIGH (ref 70–99)
Glucose-Capillary: 153 mg/dL — ABNORMAL HIGH (ref 70–99)
Glucose-Capillary: 162 mg/dL — ABNORMAL HIGH (ref 70–99)

## 2021-05-26 LAB — BASIC METABOLIC PANEL
Anion gap: 13 (ref 5–15)
BUN: 41 mg/dL — ABNORMAL HIGH (ref 8–23)
CO2: 32 mmol/L (ref 22–32)
Calcium: 9.1 mg/dL (ref 8.9–10.3)
Chloride: 86 mmol/L — ABNORMAL LOW (ref 98–111)
Creatinine, Ser: 1.58 mg/dL — ABNORMAL HIGH (ref 0.44–1.00)
GFR, Estimated: 34 mL/min — ABNORMAL LOW (ref >60)
Glucose, Bld: 188 mg/dL — ABNORMAL HIGH (ref 70–99)
Potassium: 2.9 mmol/L — ABNORMAL LOW (ref 3.5–5.1)
Sodium: 131 mmol/L — ABNORMAL LOW (ref 135–145)

## 2021-05-26 LAB — MAGNESIUM: Magnesium: 2 mg/dL (ref 1.7–2.4)

## 2021-05-26 MED ORDER — METOLAZONE 2.5 MG PO TABS
2.5000 mg | ORAL_TABLET | Freq: Every day | ORAL | Status: DC
  Administered 2021-05-26: 2.5 mg via ORAL
  Filled 2021-05-26 (×2): qty 1

## 2021-05-26 MED ORDER — POTASSIUM CHLORIDE 10 MEQ/100ML IV SOLN: 10.0000 meq | INTRAVENOUS | Status: DC

## 2021-05-26 MED ORDER — TORSEMIDE 20 MG PO TABS
20.0000 mg | ORAL_TABLET | Freq: Every day | ORAL | Status: DC
  Administered 2021-05-26: 20 mg via ORAL
  Filled 2021-05-26: qty 1

## 2021-05-26 MED ORDER — POTASSIUM CHLORIDE CRYS ER 20 MEQ PO TBCR
40.0000 meq | EXTENDED_RELEASE_TABLET | Freq: Once | ORAL | Status: AC
  Administered 2021-05-26: 40 meq via ORAL
  Filled 2021-05-26: qty 2

## 2021-05-26 MED ORDER — POTASSIUM CHLORIDE CRYS ER 20 MEQ PO TBCR: 40.0000 meq | EXTENDED_RELEASE_TABLET | Freq: Two times a day (BID) | ORAL | Status: DC

## 2021-05-26 MED ORDER — POTASSIUM CHLORIDE 10 MEQ/100ML IV SOLN
10.0000 meq | INTRAVENOUS | Status: AC
  Administered 2021-05-26 (×4): 10 meq via INTRAVENOUS
  Filled 2021-05-26 (×4): qty 100

## 2021-05-26 MED ORDER — POTASSIUM CHLORIDE CRYS ER 20 MEQ PO TBCR
40.0000 meq | EXTENDED_RELEASE_TABLET | Freq: Two times a day (BID) | ORAL | Status: AC
  Administered 2021-05-26: 40 meq via ORAL
  Filled 2021-05-26: qty 2

## 2021-05-26 NOTE — Progress Notes (Addendum)
PROGRESS NOTE    Karen Farley  IOE:[id number] DOB: 21-Dec-1945 DOA: 05/17/2021 PCP: Valerie Roys, DO    Brief Narrative:   Karen Farley is a 75 year old woman with acute on chronic HFpEF and permanent atrial fibrillation  05/23/2021 feels better but feels like she is not urinating as much as she is getting IV Lasix.  Still not at baseline. 8/4 patient feeling better.  Still with lower extremity edema.  Reports urine output better.  Also discussed with patient to use her home lymphedema pump 4 times a day for 1 hour.  Discussed this with nursing 2.  8/5 wearing her lymphedema pump this am.   8/6 using lymphedema pump 3 times daily and has noticed leg swelling are getting better  Consultants:  Cardiology With history nephrology Procedures:   Antimicrobials:      Subjective: Feeling better, shortness of breath is improving no chest pain  Objective: Vitals:   05/25/21 1955 05/26/21 0425 05/26/21 0520 05/26/21 0808  BP: 101/66 102/62  103/89  Pulse: 85 81  80  Resp: 16 16  16   Temp: 97.7 F (36.5 C) 97.9 F (36.6 C)  98 F (36.7 C)  TempSrc: Oral Oral    SpO2: 96% 94%  95%  Weight:   99.5 kg   Height:        Intake/Output Summary (Last 24 hours) at 05/26/2021 1114 Last data filed at 05/26/2021 1018 Gross per 24 hour  Intake 720 ml  Output 1250 ml  Net -530 ml   Filed Weights   05/25/21 0554 05/25/21 1047 05/26/21 0520  Weight: 103 kg 98.8 kg 99.5 kg    Examination: NAD, calm CTA no wheeze rales rhonchi's Reg s1/s2 no gallop Soft benign +bs Decrease LE edema, b/l Aaxoxox4 grossly intact   Data Reviewed: I have personally reviewed following labs and imaging studies  CBC: Recent Labs  Lab 05/26/21 0443  WBC 6.3  HGB 12.0  HCT 37.0  MCV 84.3  PLT 182   Basic Metabolic Panel: Recent Labs  Lab 05/21/21 0512 05/22/21 1116 05/23/21 0423 05/25/21 0449 05/26/21 0443 05/26/21 0905  NA 141 136 136 136  --  131*  K 3.4* 3.8 3.6 2.5*  --  2.9*   CL 97* 97* 96* 88*  --  86*  CO2 32 32 31 33*  --  32  GLUCOSE 137* 182* 136* 135*  --  188*  BUN 33* 30* 31* 41*  --  41*  CREATININE 1.40* 1.41* 1.45* 1.54*  --  1.58*  CALCIUM 9.3 9.1 9.0 9.4  --  9.1  MG  --   --   --   --  2.0  --    GFR: Estimated Creatinine Clearance: 33.9 mL/min (A) (by C-G formula based on SCr of 1.58 mg/dL (H)). Liver Function Tests: No results for input(s): AST, ALT, ALKPHOS, BILITOT, PROT, ALBUMIN in the last 168 hours. No results for input(s): LIPASE, AMYLASE in the last 168 hours. No results for input(s): AMMONIA in the last 168 hours. Coagulation Profile: No results for input(s): INR, PROTIME in the last 168 hours. Cardiac Enzymes: No results for input(s): CKTOTAL, CKMB, CKMBINDEX, TROPONINI in the last 168 hours. BNP (last 3 results) No results for input(s): PROBNP in the last 8760 hours. HbA1C: No results for input(s): HGBA1C in the last 72 hours. CBG: Recent Labs  Lab 05/25/21 0739 05/25/21 1141 05/25/21 1637 05/25/21 2059 05/26/21 0809  GLUCAP 149* 149* 151* 197* 163*   Lipid Profile: No results  for input(s): CHOL, HDL, LDLCALC, TRIG, CHOLHDL, LDLDIRECT in the last 72 hours. Thyroid Function Tests: No results for input(s): TSH, T4TOTAL, FREET4, T3FREE, THYROIDAB in the last 72 hours. Anemia Panel: No results for input(s): VITAMINB12, FOLATE, FERRITIN, TIBC, IRON, RETICCTPCT in the last 72 hours. Sepsis Labs: No results for input(s): PROCALCITON, LATICACIDVEN in the last 168 hours.   Recent Results (from the past 240 hour(s))  Resp Panel by RT-PCR (Flu A&B, Covid) Nasopharyngeal Swab     Status: None   Collection Time: 05/17/21  5:20 AM   Specimen: Nasopharyngeal Swab; Nasopharyngeal(NP) swabs in vial transport medium  Result Value Ref Range Status   SARS Coronavirus 2 by RT PCR NEGATIVE NEGATIVE Final    Comment: (NOTE) SARS-CoV-2 target nucleic acids are NOT DETECTED.  The SARS-CoV-2 RNA is generally detectable in upper  respiratory specimens during the acute phase of infection. The lowest concentration of SARS-CoV-2 viral copies this assay can detect is 138 copies/mL. A negative result does not preclude SARS-Cov-2 infection and should not be used as the sole basis for treatment or other patient management decisions. A negative result may occur with  improper specimen collection/handling, submission of specimen other than nasopharyngeal swab, presence of viral mutation(s) within the areas targeted by this assay, and inadequate number of viral copies(<138 copies/mL). A negative result must be combined with clinical observations, patient history, and epidemiological information. The expected result is Negative.  Fact Sheet for Patients:  EntrepreneurPulse.com.au  Fact Sheet for Healthcare Providers:  IncredibleEmployment.be  This test is no t yet approved or cleared by the [place] FDA and  has been authorized for detection and/or diagnosis of SARS-CoV-2 by FDA under an Emergency Use Authorization (EUA). This EUA will remain  in effect (meaning this test can be used) for the duration of the COVID-19 declaration under Section 564(b)(1) of the Act, 21 U.S.C.section 360bbb-3(b)(1), unless the authorization is terminated  or revoked sooner.       Influenza A by PCR NEGATIVE NEGATIVE Final   Influenza B by PCR NEGATIVE NEGATIVE Final    Comment: (NOTE) The Xpert Xpress SARS-CoV-2/FLU/RSV plus assay is intended as an aid in the diagnosis of influenza from Nasopharyngeal swab specimens and should not be used as a sole basis for treatment. Nasal washings and aspirates are unacceptable for Xpert Xpress SARS-CoV-2/FLU/RSV testing.  Fact Sheet for Patients: EntrepreneurPulse.com.au  Fact Sheet for Healthcare Providers: IncredibleEmployment.be  This test is not yet approved or cleared by the [place] FDA and has been  authorized for detection and/or diagnosis of SARS-CoV-2 by FDA under an Emergency Use Authorization (EUA). This EUA will remain in effect (meaning this test can be used) for the duration of the COVID-19 declaration under Section 564(b)(1) of the Act, 21 U.S.C. section 360bbb-3(b)(1), unless the authorization is terminated or revoked.  Performed at [facility], [address], [place], [place] [address]          Radiology Studies: No results found.      Scheduled Meds:  acidophilus  1 capsule Oral TID   apixaban  5 mg Oral BID   cephALEXin  500 mg Oral Q6H   cholecalciferol  2,000 Units Oral Daily   insulin aspart  0-15 Units Subcutaneous TID WC   insulin aspart  0-5 Units Subcutaneous QHS   latanoprost  1 drop Both Eyes QHS   metolazone  2.5 mg Oral Daily   metoprolol succinate  50 mg Oral Daily   multivitamin with minerals  1 tablet Oral  Daily   potassium chloride  40 mEq Oral Once   potassium chloride  40 mEq Oral BID   spironolactone  25 mg Oral Daily   torsemide  20 mg Oral Daily   Continuous Infusions:  potassium chloride      Assessment & Plan:   Principal Problem:   Volume overload Active Problems:   Chronic venous insufficiency   Atrial fibrillation (HCC)   Type 2 diabetes mellitus (HCC)   PAD (peripheral artery disease) (HCC)   Stage 3b chronic kidney disease (HCC)   Pleural effusion   Acute on chronic diastolic CHF (congestive heart failure) (HCC)   Obesity, Class III, BMI 40-49.9 (morbid obesity) ([place])   Chronic anticoagulation   Anasarca   Karen Farley is a 75 y.o. female with medical history significant for HFpEF (EF 60-65% on echo 11/2019), atrial fibrillation on Eliquis, essential hypertension, hyperlipidemia, chronic lymphedema, chronic venous insufficiency, PAD, type 2 diabetes, CKD stage IIIb is admitted for heart failure exacerbation.  She was recently hospitalized in March 22, 2001 2 in the setting of hypotension and acute  kidney injury requiring reduction in diuretic dose of torsemide 20 mg 3 times a week.  She continued to get more lower extremity swelling, weight gain and increase in abdominal girth.   Anasarca Acute on chronic diastolic CHF Left Pleural effusion - Patient with fluid retention not responding to outpatient increase in torsemide, - CXRon admisison with left pleural effusion -moderate 8/6-clinically improving Cards following Started on metolazone 2/5 mg qd Start torsemide 20mg  qd I/o, daily wt Started on spironolactone  Hypokalemia-severe Continue aggressive replacement Monitor levels   Permanent atrial fibrillation (HCC)   Chronic anticoagulation Heart rate controlled       Type 2 diabetes mellitus (HCC) BG stable continue R-ISS       Stage 3b chronic kidney disease ([place]) Creatinine going down Nephrology following Added spironolactone since potassium has been low Monitor closely    Obesity, Class III, BMI 40-49.9 (morbid obesity) ([place]) - Complicating factor to overall prognosis and care   Rt index finger-mildly red and swollen from fingersticks being check on the finger questionable cellulitis Continue empiric Keflex treatment warm compresses  DVT prophylaxis: Eliquis Code Status: Full Family Communication: None at bedside Disposition Plan:  Status is: Inpatient  Remains inpatient appropriate because:Inpatient level of care appropriate due to severity of illness  Dispo: The patient is from: Home              Anticipated d/c is to: Home              Patient currently is not medically stable to d/c.   Difficult to place patient No            LOS: 7 days   Time spent: 45 minutes with more than 50% on [place], MD [facility] Pager [phone]  If 7PM-7AM, please contact night-coverage

## 2021-05-26 NOTE — Progress Notes (Signed)
Progress Note  Patient Name: Karen Farley Date of Encounter: 05/26/2021  [facility] Cardiologist: Kate Sable, MD   Subjective   No acute events overnight, states gaining 1 to 2 pounds over the past 24 hours.  Diuretics held yesterday due to low potassium and bump in renal function.  Overall shortness of breath and edema are improved.  Inpatient Medications    Scheduled Meds:  acidophilus  1 capsule Oral TID   apixaban  5 mg Oral BID   cephALEXin  500 mg Oral Q6H   cholecalciferol  2,000 Units Oral Daily   insulin aspart  0-15 Units Subcutaneous TID WC   insulin aspart  0-5 Units Subcutaneous QHS   latanoprost  1 drop Both Eyes QHS   metoprolol succinate  50 mg Oral Daily   multivitamin with minerals  1 tablet Oral Daily   potassium chloride  40 mEq Oral Daily   spironolactone  25 mg Oral Daily   Continuous Infusions:  PRN Meds: acetaminophen **OR** acetaminophen, ondansetron **OR** ondansetron (ZOFRAN) IV   Vital Signs    Vitals:   05/25/21 1955 05/26/21 0425 05/26/21 0520 05/26/21 0808  BP: 101/66 102/62  103/89  Pulse: 85 81  80  Resp: 16 16  16   Temp: 97.7 F (36.5 C) 97.9 F (36.6 C)  98 F (36.7 C)  TempSrc: Oral Oral    SpO2: 96% 94%  95%  Weight:   99.5 kg   Height:        Intake/Output Summary (Last 24 hours) at 05/26/2021 1051 Last data filed at 05/26/2021 1018 Gross per 24 hour  Intake 720 ml  Output 1250 ml  Net -530 ml   Last 3 Weights 05/26/2021 05/25/2021 05/25/2021  Weight (lbs) 219 lb 6.4 oz 217 lb 14.4 oz 227 lb 1.2 oz  Weight (kg) 99.519 kg 98.839 kg 103 kg      Telemetry    Atrial fibrillation, heart rate 62- Personally Reviewed  ECG     - Personally Reviewed  Physical Exam   GEN: No acute distress.   Neck: No JVD Cardiac: Irregular irregular Respiratory: Decreased breath sounds at bases GI: Soft, nontender, non-distended  MS: 1-2+ edema; No deformity. Neuro:  Nonfocal  Psych: Normal affect   Labs    High  Sensitivity Troponin:   Recent Labs  Lab 05/16/21 1744 05/16/21 2028  TROPONINIHS 12 13      Chemistry Recent Labs  Lab 05/23/21 0423 05/25/21 0449 05/26/21 0905  NA 136 136 131*  K 3.6 2.5* 2.9*  CL 96* 88* 86*  CO2 31 33* 32  GLUCOSE 136* 135* 188*  BUN 31* 41* 41*  CREATININE 1.45* 1.54* 1.58*  CALCIUM 9.0 9.4 9.1  GFRNONAA 38* 35* 34*  ANIONGAP 9 15 13      Hematology Recent Labs  Lab 05/26/21 0443  WBC 6.3  RBC 4.39  HGB 12.0  HCT 37.0  MCV 84.3  MCH 27.3  MCHC 32.4  RDW 16.1*  PLT 277    BNPNo results for input(s): BNP, PROBNP in the last 168 hours.   DDimer No results for input(s): DDIMER in the last 168 hours.   Radiology    No results found.  Cardiac Studies   Echo 04/2021 1. Left ventricular ejection fraction, by estimation, is 60 to 65%. The  left ventricle has normal function. The left ventricle has no regional  wall motion abnormalities. Left ventricular diastolic parameters are  indeterminate.   2. Right ventricular systolic function is  normal. The right ventricular  size is normal. Tricuspid regurgitation signal is inadequate for assessing  PA pressure.   3. Left atrial size was mild to moderately dilated.   4. Left pleural effusion noted   Patient Profile     75 y.o. female with history of persistent A. fib, HFpEF, CKD 3 presenting with shortness of breath and worsening edema.  Left pleural effusion S/p left thoracentesis.  Being seen for volume overload, HFpEF, A. fib.  Assessment & Plan    1.  HFpEF, volume overload -Start metolazone 2.5 mg daily, start torsemide 20mg  qd -kcl 40mg  bid -Creatinine stable -Monitor creatinine, monitor daily weights, ins and outs.   2.  Persistent A. Fib -Heart rate controlled with Toprol-XL -Continue Eliquis twice daily.   3.  CKD 3 -Avoid nephrotoxic's   Greater than 50% was spent in counseling and coordination of care with patient Total encounter time 35 minutes      Signed, Kate Sable, MD  05/26/2021, 10:51 AM

## 2021-05-26 NOTE — Evaluation (Signed)
Physical Therapy Evaluation Patient Details Name: Karen Farley MRN: [id number] DOB: 26-May-1946 Today's Date: 05/26/2021   History of Present Illness  Per MD notes, pt is a 75 y.o. female who is admitted for heart failure exacerbation. PMH includes HFpEF (EF 60-65% on echo 11/2019), atrial fibrillation on Eliquis, essential hypertension, hyperlipidemia, chronic lymphedema, chronic venous insufficiency, PAD, type 2 diabetes, CKD stage IIIb. MD assessment includes anasarca, acute on chronic diastolic CHF, left pleural effusion, and stage 3b chronic kidney disease.  Clinical Impression  Most recent potassium 2.9. Per conversation with MD, pt was okay to participate with PT with no restrictions. Pt was pleasant and motivated to participate during the session. Pt gave good effort and vitals were stable throughout session. Pt able to perform transfers with min guard from elevated surface with good eccentric/concentric control. Pt able to ambulate first half of walk without UE support but towards the end needed intermittent single UE support grabbing on to hallway rails and furniture to maintain balance secondary to chronic LBP which limits walking distance at baseline function. Pt educated briefly on edema massage but encouraged to follow up with home health therapist on further guidance. Pt will benefit from HHPT upon discharge to safely address deficits listed in patient problem list for decreased caregiver assistance and eventual return to PLOF.       Follow Up Recommendations Home health PT;Supervision for mobility/OOB;Other (comment) (Continue with HHPT started prior to current admission)    Equipment Recommendations  None recommended by PT    Recommendations for Other Services       Precautions / Restrictions Precautions Precautions: Fall Restrictions Weight Bearing Restrictions: No      Mobility  Bed Mobility               General bed mobility comments: NT, in recliner upon  arrival    Transfers Overall transfer level: Needs assistance Equipment used: None Transfers: Sit to/from Stand Sit to Stand: Min guard;From elevated surface         General transfer comment: Min guard for increased safety.  Ambulation/Gait Ambulation/Gait assistance: Min guard Gait Distance (Feet): 70 Feet Assistive device: None Gait Pattern/deviations: Step-through pattern;Decreased stride length;Trunk flexed Gait velocity: decreased   General Gait Details: Pt intermittently used single UE support holding on to furniture or hallway railing but was able to walk without UE support for 2/3 of the walk with encouragement. Pt limited with distance secondary chronic LBP.  Stairs            Wheelchair Mobility    Modified Rankin (Stroke Patients Only)       Balance Overall balance assessment: Needs assistance Sitting-balance support: Feet supported;No upper extremity supported Sitting balance-Leahy Scale: Normal     Standing balance support: Single extremity supported Standing balance-Leahy Scale: Good Standing balance comment: Intermittent single UE support to maintain balance.                             Pertinent Vitals/Pain Pain Assessment: 0-10 Pain Score: 7  Pain Location: LBP 7/10(chronic) and right index finger 3/10(secondary to infection) Pain Descriptors / Indicators: Aching;Sore Pain Intervention(s): Monitored during session    Home Living Family/patient expects to be discharged to:: Private residence Living Arrangements: Spouse/significant other Available Help at Discharge: Family;Available 24 hours/day Type of Home: House Home Access: Level entry     Home Layout: One level Home Equipment: Walker - 2 wheels;Cane - single point;Shower seat - built in Additional  Comments: Pt has LE compression devices she uses twice per day at home for an hour at a time for edema.    Prior Function Level of Independence: Independent with assistive  device(s)         Comments: Pt has been using RW since hospitalization in June but has gradually been using it less and is at prior baseline of no AD for short household distances and [facility] for longer distances or outside.  No home O2. No fall history. Husband assists with household chores, transportation and IADLs as needed. Pt reports just starting HHPT 1x per week prior to current admission and would like to continue after d/c.     Hand Dominance   Dominant Hand: Right    Extremity/Trunk Assessment   Upper Extremity Assessment Upper Extremity Assessment: Overall WFL for tasks assessed    Lower Extremity Assessment Lower Extremity Assessment: Overall WFL for tasks assessed    Cervical / Trunk Assessment Cervical / Trunk Assessment: Kyphotic  Communication   Communication: No difficulties  Cognition Arousal/Alertness: Awake/alert Behavior During Therapy: WFL for tasks assessed/performed Overall Cognitive Status: Within Functional Limits for tasks assessed                                        General Comments      Exercises Other Exercises Other Exercises: Static standing 1-2 min with min guard for improved activity tolerance.   Assessment/Plan    PT Assessment Patient needs continued PT services  PT Problem List Decreased strength;Decreased activity tolerance;Decreased balance;Decreased mobility;Pain       PT Treatment Interventions DME instruction;Gait training;Stair training;Therapeutic activities;Therapeutic exercise;Balance training;Functional mobility training;Patient/family education    PT Goals (Current goals can be found in the Care Plan section)  Acute Rehab PT Goals Patient Stated Goal: Move around without AD PT Goal Formulation: With patient Time For Goal Achievement: 06/08/21 Potential to Achieve Goals: Good    Frequency Min 2X/week   Barriers to discharge        Co-evaluation               AM-PAC PT "6 Clicks"  Mobility  Outcome Measure Help needed turning from your back to your side while in a flat bed without using bedrails?: None Help needed moving from lying on your back to sitting on the side of a flat bed without using bedrails?: None Help needed moving to and from a bed to a chair (including a wheelchair)?: A Little Help needed standing up from a chair using your arms (e.g., wheelchair or bedside chair)?: None Help needed to walk in hospital room?: A Little Help needed climbing 3-5 steps with a railing? : A Little 6 Click Score: 21    End of Session Equipment Utilized During Treatment: Gait belt Activity Tolerance: Patient tolerated treatment well Patient left: in chair;with call bell/phone within reach;with nursing/sitter in room Nurse Communication: Mobility status PT Visit Diagnosis: Unsteadiness on feet (R26.81);Pain;Difficulty in walking, not elsewhere classified (R26.2) Pain - Right/Left:  (LBP) Pain - part of body:  (LBP)    Time: 0973-5329 PT Time Calculation (min) (ACUTE ONLY): 26 min   Charges:              Dayton Scrape SPT 05/26/21, 5:07 PM

## 2021-05-26 NOTE — Progress Notes (Signed)
[facility]  ROUNDING NOTE   Subjective:   Karen Farley is a 75 y.o. female with past medical history of diastolic CHF, diabetes, lymphedema, Atrial fib on Eliquis and CKD 3B. She presents to the ED with increased lower extremity edema and shortness of breath. She has been admitted for Anasarca [R60.1] Peripheral edema [R60.9] Pleural effusion [J90] Volume overload [E87.70]   Patient is seen in our office by Dr Candiss Norse. She had a recent admission for hypotension and AKI due to diuretic intolerance.   Patient sitting up in chair with lymphedema pumps on Alert and oriented Denies nausea and vomiting Denies shortness of breath She feels her swelling is improving  UOP 1.5L in last 24 hours   Objective:  Vital signs in last 24 hours:  Temp:  [97.7 F (36.5 C)-98.4 F (36.9 C)] 98.1 F (36.7 C) (08/06 1156) Pulse Rate:  [71-85] 71 (08/06 1156) Resp:  [16-18] 18 (08/06 1156) BP: (96-111)/(62-89) 111/64 (08/06 1156) SpO2:  [94 %-99 %] 97 % (08/06 1156) Weight:  [99.5 kg] 99.5 kg (08/06 0520)  Weight change: -4.161 kg Filed Weights   05/25/21 0554 05/25/21 1047 05/26/21 0520  Weight: 103 kg 98.8 kg 99.5 kg    Intake/Output: I/O last 3 completed shifts: In: 38 [P.O.:720] Out: 3850 [Urine:3850]   Intake/Output this shift:  Total I/O In: 240 [P.O.:240] Out: -   Physical Exam: General: NAD, sitting up in chair  Head: Normocephalic, atraumatic. Moist oral mucosal membranes  Eyes: Anicteric  Lungs:  Diminished in bases, normal effort  Heart: Regular rate and rhythm  Abdomen:  Soft, nontender, distended  Extremities:  1+ peripheral edema.abdominal wall edema (improving)  Neurologic: Alert, moving all four extremities  Skin: No lesions       Basic Metabolic Panel: Recent Labs  Lab 05/21/21 0512 05/22/21 1116 05/23/21 0423 05/25/21 0449 05/26/21 0443 05/26/21 0905  NA 141 136 136 136  --  131*  K 3.4* 3.8 3.6 2.5*  --  2.9*  CL 97* 97* 96* 88*   --  86*  CO2 32 32 31 33*  --  32  GLUCOSE 137* 182* 136* 135*  --  188*  BUN 33* 30* 31* 41*  --  41*  CREATININE 1.40* 1.41* 1.45* 1.54*  --  1.58*  CALCIUM 9.3 9.1 9.0 9.4  --  9.1  MG  --   --   --   --  2.0  --      Liver Function Tests: No results for input(s): AST, ALT, ALKPHOS, BILITOT, PROT, ALBUMIN in the last 168 hours. No results for input(s): LIPASE, AMYLASE in the last 168 hours. No results for input(s): AMMONIA in the last 168 hours.  CBC: Recent Labs  Lab 05/26/21 0443  WBC 6.3  HGB 12.0  HCT 37.0  MCV 84.3  PLT 277     Cardiac Enzymes: No results for input(s): CKTOTAL, CKMB, CKMBINDEX, TROPONINI in the last 168 hours.  BNP: Invalid input(s): POCBNP  CBG: Recent Labs  Lab 05/25/21 1141 05/25/21 1637 05/25/21 2059 05/26/21 0809 05/26/21 1154  GLUCAP 149* 151* 197* 163* 176*     Microbiology: Results for orders placed or performed during the hospital encounter of 05/17/21  Resp Panel by RT-PCR (Flu A&B, Covid) Nasopharyngeal Swab     Status: None   Collection Time: 05/17/21  5:20 AM   Specimen: Nasopharyngeal Swab; Nasopharyngeal(NP) swabs in vial transport medium  Result Value Ref Range Status   SARS Coronavirus 2 by RT PCR  NEGATIVE NEGATIVE Final    Comment: (NOTE) SARS-CoV-2 target nucleic acids are NOT DETECTED.  The SARS-CoV-2 RNA is generally detectable in upper respiratory specimens during the acute phase of infection. The lowest concentration of SARS-CoV-2 viral copies this assay can detect is 138 copies/mL. A negative result does not preclude SARS-Cov-2 infection and should not be used as the sole basis for treatment or other patient management decisions. A negative result may occur with  improper specimen collection/handling, submission of specimen other than nasopharyngeal swab, presence of viral mutation(s) within the areas targeted by this assay, and inadequate number of viral copies(<138 copies/mL). A negative result must be  combined with clinical observations, patient history, and epidemiological information. The expected result is Negative.  Fact Sheet for Patients:  EntrepreneurPulse.com.au  Fact Sheet for Healthcare Providers:  IncredibleEmployment.be  This test is no t yet approved or cleared by the [place] FDA and  has been authorized for detection and/or diagnosis of SARS-CoV-2 by FDA under an Emergency Use Authorization (EUA). This EUA will remain  in effect (meaning this test can be used) for the duration of the COVID-19 declaration under Section 564(b)(1) of the Act, 21 U.S.C.section 360bbb-3(b)(1), unless the authorization is terminated  or revoked sooner.       Influenza A by PCR NEGATIVE NEGATIVE Final   Influenza B by PCR NEGATIVE NEGATIVE Final    Comment: (NOTE) The Xpert Xpress SARS-CoV-2/FLU/RSV plus assay is intended as an aid in the diagnosis of influenza from Nasopharyngeal swab specimens and should not be used as a sole basis for treatment. Nasal washings and aspirates are unacceptable for Xpert Xpress SARS-CoV-2/FLU/RSV testing.  Fact Sheet for Patients: EntrepreneurPulse.com.au  Fact Sheet for Healthcare Providers: IncredibleEmployment.be  This test is not yet approved or cleared by the [place] FDA and has been authorized for detection and/or diagnosis of SARS-CoV-2 by FDA under an Emergency Use Authorization (EUA). This EUA will remain in effect (meaning this test can be used) for the duration of the COVID-19 declaration under Section 564(b)(1) of the Act, 21 U.S.C. section 360bbb-3(b)(1), unless the authorization is terminated or revoked.  Performed at [facility], [address] [place], [place] [address]     Coagulation Studies: No results for input(s): LABPROT, INR in the last 72 hours.  Urinalysis: No results for input(s): COLORURINE, LABSPEC, PHURINE, GLUCOSEU,  HGBUR, BILIRUBINUR, KETONESUR, PROTEINUR, UROBILINOGEN, NITRITE, LEUKOCYTESUR in the last 72 hours.  Invalid input(s): APPERANCEUR    Imaging: No results found.   Medications:    potassium chloride 10 mEq (05/26/21 1214)    acidophilus  1 capsule Oral TID   apixaban  5 mg Oral BID   cephALEXin  500 mg Oral Q6H   cholecalciferol  2,000 Units Oral Daily   insulin aspart  0-15 Units Subcutaneous TID WC   insulin aspart  0-5 Units Subcutaneous QHS   latanoprost  1 drop Both Eyes QHS   metolazone  2.5 mg Oral Daily   metoprolol succinate  50 mg Oral Daily   multivitamin with minerals  1 tablet Oral Daily   potassium chloride  40 mEq Oral BID   spironolactone  25 mg Oral Daily   torsemide  20 mg Oral Daily   acetaminophen **OR** acetaminophen, ondansetron **OR** ondansetron (ZOFRAN) IV  Assessment/ Plan:  Karen Farley is a 75 y.o.  female past medical history of diastolic CHF, diabetes, lymphedema, Atrial fib on Eliquis and CKD 3B. She presents to the ED with increased lower extremity edema  and shortness of breath. She has been admitted for Anasarca [R60.1] Peripheral edema [R60.9] Pleural effusion [J90] Volume overload [E87.70]   Chronic Kidney disease 3B Followed by [facility] Dr Candiss Norse. Recent admission for AKI. Discharged on Torsemide 20mg  TID. Furosemide and Metolazone held due to elevated creatine and hypokalemia. Increased Spironolactone to 50mg  daily. May consider restarting Lasix tomorrow. Continue lymphedema pumps at recommended TID schedule. Will continue to monitor  Lab Results  Component Value Date   CREATININE 1.58 (H) 05/26/2021   CREATININE 1.54 (H) 05/25/2021   CREATININE 1.45 (H) 05/23/2021    Intake/Output Summary (Last 24 hours) at 05/26/2021 1315 Last data filed at 05/26/2021 1018 Gross per 24 hour  Intake 720 ml  Output 1250 ml  Net -530 ml    2. .Diabetes mellitus type II with chronic kidney disease  noninsulin dependent. Most recent hemoglobin A1c  is 6.6 on 05/17/21. Glucose stable. Primary team to manage SSI.  3.diastolic Heart Failure ECHO on 04/27/21 shows EF 60-65% Furosemide and Metolazone held  Will monitor diuretic usage Cardiology following  4. Hypokalemia 2.9 improved  Oral and IV supplementation ordered Spironolactone 50mg  daily.    LOS: 7 Karen Farley 8/6/20221:15 PM

## 2021-05-27 DIAGNOSIS — I4819 Other persistent atrial fibrillation: Secondary | ICD-10-CM | POA: Diagnosis not present

## 2021-05-27 DIAGNOSIS — I5033 Acute on chronic diastolic (congestive) heart failure: Secondary | ICD-10-CM | POA: Diagnosis not present

## 2021-05-27 DIAGNOSIS — E8779 Other fluid overload: Secondary | ICD-10-CM | POA: Diagnosis not present

## 2021-05-27 DIAGNOSIS — R601 Generalized edema: Secondary | ICD-10-CM | POA: Diagnosis not present

## 2021-05-27 LAB — GLUCOSE, CAPILLARY
Glucose-Capillary: 145 mg/dL — ABNORMAL HIGH (ref 70–99)
Glucose-Capillary: 161 mg/dL — ABNORMAL HIGH (ref 70–99)

## 2021-05-27 LAB — BASIC METABOLIC PANEL
Anion gap: 7 (ref 5–15)
BUN: 44 mg/dL — ABNORMAL HIGH (ref 8–23)
CO2: 33 mmol/L — ABNORMAL HIGH (ref 22–32)
Calcium: 9.3 mg/dL (ref 8.9–10.3)
Chloride: 89 mmol/L — ABNORMAL LOW (ref 98–111)
Creatinine, Ser: 1.73 mg/dL — ABNORMAL HIGH (ref 0.44–1.00)
GFR, Estimated: 30 mL/min — ABNORMAL LOW (ref >60)
Glucose, Bld: 133 mg/dL — ABNORMAL HIGH (ref 70–99)
Potassium: 3.5 mmol/L (ref 3.5–5.1)
Sodium: 129 mmol/L — ABNORMAL LOW (ref 135–145)

## 2021-05-27 MED ORDER — SPIRONOLACTONE 25 MG PO TABS: 25.0000 mg | ORAL_TABLET | Freq: Every day | ORAL | 0 refills | Status: DC

## 2021-05-27 MED ORDER — METOLAZONE 2.5 MG PO TABS: 2.5000 mg | ORAL_TABLET | Freq: Every day | ORAL | 0 refills | Status: DC

## 2021-05-27 MED ORDER — TORSEMIDE 20 MG PO TABS: 20.0000 mg | ORAL_TABLET | Freq: Every day | ORAL | Status: DC

## 2021-05-27 MED ORDER — TORSEMIDE 20 MG PO TABS: 20.0000 mg | ORAL_TABLET | Freq: Every day | ORAL | 0 refills | Status: DC

## 2021-05-27 MED ORDER — METOLAZONE 2.5 MG PO TABS
2.5000 mg | ORAL_TABLET | Freq: Every day | ORAL | Status: DC
  Filled 2021-05-27: qty 1

## 2021-05-27 MED ORDER — CEPHALEXIN 500 MG PO CAPS: 500.0000 mg | ORAL_CAPSULE | Freq: Three times a day (TID) | ORAL | 0 refills | Status: AC

## 2021-05-27 NOTE — Progress Notes (Addendum)
Progress Note  Patient Name: Karen Farley Date of Encounter: 05/27/2021  [facility] Cardiologist: Kate Sable, MD   Subjective   Started on torsemide and metolazone yesterday following diuretic holiday on 8/5 in the setting of AKI and hypokalemia. Documented UOP 3.4 L for the past 24 hours with a net - 18.8 L for the admission. Renal function trending up this morning. Weight 99.5-->99.2 kg.   Dyspnea is improved. Still notes lower extremity edema, which is chronic, as well as abdominal swelling.   Inpatient Medications    Scheduled Meds:  acidophilus  1 capsule Oral TID   apixaban  5 mg Oral BID   cephALEXin  500 mg Oral Q6H   cholecalciferol  2,000 Units Oral Daily   insulin aspart  0-15 Units Subcutaneous TID WC   insulin aspart  0-5 Units Subcutaneous QHS   latanoprost  1 drop Both Eyes QHS   metoprolol succinate  50 mg Oral Daily   multivitamin with minerals  1 tablet Oral Daily   potassium chloride  40 mEq Oral BID   spironolactone  25 mg Oral Daily   Continuous Infusions:  PRN Meds: acetaminophen **OR** acetaminophen, ondansetron **OR** ondansetron (ZOFRAN) IV   Vital Signs    Vitals:   05/26/21 2011 05/27/21 0213 05/27/21 0443 05/27/21 0736  BP: (!) 96/57  114/61 101/63  Pulse: 72  60 62  Resp: 18  20 18   Temp: (!) 97.5 F (36.4 C)  97.7 F (36.5 C) 97.8 F (36.6 C)  TempSrc:      SpO2: 93%  92% 96%  Weight:  99.2 kg    Height:        Intake/Output Summary (Last 24 hours) at 05/27/2021 0844 Last data filed at 05/27/2021 0447 Gross per 24 hour  Intake 240 ml  Output 3700 ml  Net -3460 ml    Last 3 Weights 05/27/2021 05/26/2021 05/25/2021  Weight (lbs) 218 lb 12.8 oz 219 lb 6.4 oz 217 lb 14.4 oz  Weight (kg) 99.247 kg 99.519 kg 98.839 kg      Telemetry    AFib 70s bpm predominantly, short episode of Afib with RVR around 2240 last night- Personally Reviewed  ECG    No new tracings - Personally Reviewed  Physical Exam   GEN: No acute  distress.   Neck: JVD not elevated. Cardiac: IRIR, no murmurs, rubs, or gallops. Respiratory: Mildly diminished breath sounds along the bases bilaterally. GI: Soft, nontender, non-distended.   MS: 1-2+ lymph edema noted; No deformity. Neuro:  Alert and oriented x 3; Nonfocal. Psych: Normal affect.  Labs    High Sensitivity Troponin:   Recent Labs  Lab 05/16/21 1744 05/16/21 2028  TROPONINIHS 12 13       Chemistry Recent Labs  Lab 05/25/21 0449 05/26/21 0905 05/27/21 0425  NA 136 131* 129*  K 2.5* 2.9* 3.5  CL 88* 86* 89*  CO2 33* 32 33*  GLUCOSE 135* 188* 133*  BUN 41* 41* 44*  CREATININE 1.54* 1.58* 1.73*  CALCIUM 9.4 9.1 9.3  GFRNONAA 35* 34* 30*  ANIONGAP 15 13 7       Hematology Recent Labs  Lab 05/26/21 0443  WBC 6.3  RBC 4.39  HGB 12.0  HCT 37.0  MCV 84.3  MCH 27.3  MCHC 32.4  RDW 16.1*  PLT 277     BNPNo results for input(s): BNP, PROBNP in the last 168 hours.   DDimer No results for input(s): DDIMER in the last 168 hours.  Radiology    No results found.  Cardiac Studies   Echo 04/2021 1. Left ventricular ejection fraction, by estimation, is 60 to 65%. The  left ventricle has normal function. The left ventricle has no regional  wall motion abnormalities. Left ventricular diastolic parameters are  indeterminate.   2. Right ventricular systolic function is normal. The right ventricular  size is normal. Tricuspid regurgitation signal is inadequate for assessing  PA pressure.   3. Left atrial size was mild to moderately dilated.   4. Left pleural effusion noted   Patient Profile     75 y.o. female with history of HFpEF, permanent A. fib, CKD stage III, lymphedema, HTN, HLD, and obesity who was admitted with acute on chronic HFpEF and a moderate left-sided pleural effusion status postthoracentesis with 400 mL of fluid removed.  Assessment & Plan    1.  HFpEF with moderate sized left pleural effusion s/p thoracentesis: -Volume status  improved -Hold metolazone today given up trending renal function -Continue torsemide 20 mg -Spironolactone, close monitoring of renal function -Consider addition of Jardiance prior to discharge or in follow up  -Significant fluid intake at home -Daily weights -Strict I/O -Monitor creatinine, monitor daily weights, ins and outs.   2.  Persistent A. Fib: -Ventricular rates well controlled -Toprol-XL -CHA2DS2-VASc at least 5 (CHF, HTN, age x2, sex category) -Continue Eliquis, therapy was interrupted during this admission for thoracentesis with DOAC being resumed on 8/1 -She does not meet reduced dosing criteria of Eliquis -No plans for inpatient DCCV given chronicity of A. fib dates back at least past 11/16/2019, and in the context of DOAC interruption   3.  Acute on CKD stage III: -Diuresis on hold as above -Avoid nephrotoxic agents   4.  Lymphedema: -She has brought her lymphedema pumps from home      Signed, Christell Faith, PA-C  05/27/2021, 8:44 AM

## 2021-05-27 NOTE — TOC Initial Note (Signed)
Transition of Care [facility]) - Initial/Assessment Note    Patient Details  Name: Karen Farley MRN: [id number] Date of Birth: 1946-07-06  [facility] of [facility] CM/SW Contact:    Alberteen Sam, LCSW Phone Number: 05/27/2021, 8:51 AM  Clinical Narrative:                  CSW  notes per PT recommendation is for [facility] services. PT reported patient wishes to resume current home health services.   Patient is active with [place], [place] with [place] aware of patient's hospital admission and will follow for discharge needs. Patient to resume HH with [facility] for PT and RN at time of discharge.   Expected Discharge Plan: [place] Barriers to Discharge: Continued Medical Work up   Patient Goals and CMS Choice Patient states their goals for this hospitalization and ongoing recovery are:: to go home CMS Medicare.gov Compare Post Acute Care list provided to:: Patient Choice offered to / list presented to : Patient  Expected Discharge Plan and Services Expected Discharge Plan: [place]       Living arrangements for the past 2 months: Single Family Home                           HH Arranged: PT, RN [facility] Agency: [place] Date [place]: 05/27/21 Time HH Agency Contacted: 0800 Representative spoke with at [place]: [place]  Prior Living Arrangements/Services Living arrangements for the past 2 months: [place] with:: Self   Do you feel safe going back to the place where you live?: Yes               Activities of Daily Living Home Assistive Devices/Equipment: Environmental consultant (specify type), Cane (specify quad or straight), Eyeglasses ADL Screening (condition at time of admission) Patient's cognitive ability adequate to safely complete daily activities?: Yes Is the patient deaf or have difficulty hearing?: No Does the patient have difficulty seeing, even when wearing glasses/contacts?: No Does the  patient have difficulty concentrating, remembering, or making decisions?: No Patient able to express need for assistance with ADLs?: Yes Does the patient have difficulty dressing or bathing?: Yes Independently performs ADLs?: No Communication: Independent Dressing (OT): Needs assistance Is this a change from baseline?: Change from baseline, expected to last >3 days Grooming: Needs assistance Is this a change from baseline?: Change from baseline, expected to last >3 days Feeding: Independent Bathing: Needs assistance Is this a change from baseline?: Change from baseline, expected to last >3 days Toileting: Needs assistance Is this a change from baseline?: Change from baseline, expected to last >3days In/Out Bed: Needs assistance Is this a change from baseline?: Change from baseline, expected to last >3 days Walks in Home: Needs assistance Is this a change from baseline?: Change from baseline, expected to last >3 days Does the patient have difficulty walking or climbing stairs?: Yes Weakness of Legs: Both Weakness of Arms/Hands: None  Permission Sought/Granted                  Emotional Assessment Appearance:: Appears stated age Attitude/Demeanor/Rapport: Gracious Affect (typically observed): Calm Orientation: : Oriented to Self, Oriented to Place, Oriented to  Time, Oriented to Situation Alcohol / Substance Use: Not Applicable Psych Involvement: No (comment)  Admission diagnosis:  Anasarca [R60.1] Peripheral edema [R60.9] Pleural effusion [J90] Volume overload [E87.70] Patient Active Problem List   Diagnosis Date Noted  Anasarca    Stage 3b chronic kidney disease ([place]) 05/17/2021   Pleural effusion 05/17/2021   Acute on chronic diastolic CHF (congestive heart failure) ([place]) 05/17/2021   Volume overload 05/17/2021   Obesity, Class III, BMI 40-49.9 (morbid obesity) ([place]) 05/17/2021   Chronic anticoagulation 05/17/2021   Cellulitis of lower extremity    Hypotension  04/11/2021   Acute kidney injury superimposed on CKD ([place]) 04/11/2021   Hyponatremia 38/38/1840   Metabolic acidosis 37/54/3606   (HFpEF) heart failure with preserved ejection fraction ([place]) 04/11/2021   Chronic venous insufficiency 08/14/2020   PAD (peripheral artery disease) ([place]) 08/14/2020   Bilateral primary osteoarthritis of knee 04/13/2020   Type 2 diabetes mellitus ([place]) 04/13/2020   Acquired spondylolisthesis 12/17/2019   Osteoarthritis of knee 12/17/2019   Lumbar radiculopathy 12/17/2019   Atrial fibrillation ([place]) 11/20/2019   Closed fracture of radial styloid 08/06/2019   Displaced fracture of unspecified radial styloid process, initial encounter for closed fracture 08/06/2019   Controlled substance agreement signed 01/24/2018   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   Piriformis syndrome 07/01/2016   Sacroiliac joint dysfunction 01/09/2016   Joint disorder, unspecified 01/09/2016   Low back pain 01/09/2016   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 diabetes mellitus ([place]) 04/28/2015   Lymphedema 03/27/2015   Edema 03/27/2015   Open-angle glaucoma, mild stage    Osteoarthritis of both knees    Type 2 diabetes mellitus with renal complication ([place])    Osteopenia    Benign hypertensive renal disease    Hyperlipidemia    Degeneration of lumbosacral intervertebral disc    PCP:  Valerie Roys, DO Pharmacy:   [facility] [place], [address] [place] [address] Phone: [phone] Fax: [phone]     Social Determinants of Health (SDOH) Interventions    Readmission Risk Interventions No flowsheet data found.

## 2021-05-27 NOTE — Discharge Summary (Signed)
Karen Farley RXV:[id number] DOB: 1946-07-20 DOA: 05/17/2021  PCP: Valerie Roys, DO  Admit date: 05/17/2021 Discharge date: 05/27/2021  Admitted From: home Disposition:  home  Recommendations for Outpatient Follow-up:  Follow up with PCP in 1 week Please obtain BMP/CBC in one week Please follow up with cardiology in one week F/u with CHF F/u with nephrology  Home Health:yes    Discharge Condition:Stable CODE STATUS:full  Diet recommendation: Heart Healthy    Brief/Interim Summary: Per HPI: Karen Farley is a 75 y.o. female with medical history significant for Diastolic CHF, permanent A. fib on Eliquis,  DM, CKD 3B, morbid obesity, hospitalized 6/21-6/29 with hypotension and AKI in part related to poor tolerance of diuretic regimen who presents to the ED with a complaint of increased lower extremity edema and lower abdominal swelling, poor exercise tolerance, dyspnea on exertion.  She had minimal improvement with outpatient up titration of torsemide by her primary cardiologist    Anasarca Acute on chronic diastolic CHF Left Pleural effusion - Patient with fluid retention not responding to outpatient increase in torsemide, - CXRon admisison with left pleural effusion -moderate She was started on IV diuretics.  Cardiology and nephrology were on board. She is clinically doing better and stable for discharge. Spoke to nephrology Dr. Candiss Norse who recommended holding metolazone, torsemide, and Aldactone and she will be seen in couple of days in nephrology clinic and they will reevaluate and resume it at that time     Hypokalemia- Supplemented now stable    Permanent atrial fibrillation ([place]) On chronic anticoagulation Heart rate control   Hyponatremia Sodium 129, asymptomatic Likely from diuresis Holding diuretics as above     Type 2 diabetes mellitus ([place]) Home meds      acute on CKD stage 3b  Mildly worsened due to diuresis Nephrology following-Dr. Candiss Norse  per our conversation recommended holding Aldactone, torsemide, and metolazone.  Will be evaluated in couple of days and resume at that time per nephrology      Obesity, Class III, BMI 40-49.9 (morbid obesity) ([place]) - Complicating factor to overall prognosis and care   Rt index finger cellulitis-mildly red and swollen from fingersticks being check on the finger questionable cellulitis Improving with Keflex, needs to complete course   Lymphedema At home should be qid.discussed with pt  Discharge Diagnoses:  Principal Problem:   Volume overload Active Problems:   Chronic venous insufficiency   Atrial fibrillation (HCC)   Type 2 diabetes mellitus (HCC)   PAD (peripheral artery disease) (HCC)   Stage 3b chronic kidney disease (HCC)   Pleural effusion   Acute on chronic diastolic CHF (congestive heart failure) (HCC)   Obesity, Class III, BMI 40-49.9 (morbid obesity) ([place])   Chronic anticoagulation   Anasarca    Discharge Instructions  Discharge Instructions     AMB referral to CHF clinic   Complete by: As directed    Call MD for:  difficulty breathing, headache or visual disturbances   Complete by: As directed    Diet - low sodium heart healthy   Complete by: As directed    Discharge instructions   Complete by: As directed    Use your pump 4x daily   Increase activity slowly   Complete by: As directed       Allergies as of 05/27/2021   No Known Allergies      Medication List     STOP taking these medications    lidocaine 5 % Commonly known as: LIDODERM  torsemide 20 MG tablet Commonly known as: DEMADEX       TAKE these medications    Accu-Chek Guide test strip Generic drug: glucose blood USE TO CHECK BLOOD SUGAR DAILY   apixaban 5 MG Tabs tablet Commonly known as: ELIQUIS Take 1 tablet (5 mg total) by mouth 2 (two) times daily.   cephALEXin 500 MG capsule Commonly known as: KEFLEX Take 1 capsule (500 mg total) by mouth 3 (three) times daily for 2  days.   latanoprost 0.005 % ophthalmic solution Commonly known as: XALATAN Place 1 drop into both eyes at bedtime.   Medical Compression Stockings Misc by Does not apply route.   metoprolol succinate 50 MG 24 hr tablet Commonly known as: TOPROL-XL TAKE 1 TABLET(50 MG) BY MOUTH DAILY WITH OR IMMEDIATELY FOLLOWING A MEAL   multivitamin with minerals Tabs tablet Take 1 tablet by mouth daily.   vitamin D3 50 MCG (2000 UT) Caps Take 1 tablet by mouth.        Follow-up Information     Murlean Iba, MD Follow up in 3 day(s).   Specialty: Nephrology Contact information: [place] [address] [phone]         Kate Sable, MD Follow up in 1 week(s).   Specialties: Cardiology, Radiology Contact information: [place] [address] [phone]         [place], DO Follow up in 1 week(s).   Specialty: Family Medicine Contact information: [place] [address] [phone]                No Known Allergies  Consultations: Cardiology, nephrology   Procedures/Studies: DG Chest 2 View  Result Date: 05/20/2021 CLINICAL DATA:  SOB, weakness, dry cough, leg swelling x "weeks". Hx of PAD, CHF, Afib. EXAM: CHEST - 2 VIEW COMPARISON:  05/16/2021 FINDINGS: Persistent left lower lung consolidation with possible effusion. Patchy perihilar airspace opacities slightly increased on the right. Heart size difficult to assess due to adjacent opacities. No pneumothorax. Visualized bones unremarkable. IMPRESSION: Slightly worse airspace disease, left greater than right, with possible effusion Electronically Signed   By: Lucrezia Europe M.D.   On: 05/20/2021 07:00   DG Chest 2 View  Result Date: 05/16/2021 CLINICAL DATA:  Shortness of breath and lower extremity swelling. EXAM: CHEST - 2 VIEW COMPARISON:  April 10, 2021 FINDINGS: There is opacification of the mid and lower left lung. This is increased in  severity when compared to the prior study. A left pleural effusion is also noted. The right lung is clear. No pneumothorax is seen. The cardiac silhouette is enlarged and unchanged in size. The visualized skeletal structures are unremarkable. IMPRESSION: 1. Worsening left basilar atelectasis and/or infiltrate with associated left-sided pleural effusion. Electronically Signed   By: Virgina Norfolk M.D.   On: 05/16/2021 19:06   DG Chest Port 1 View  Result Date: 05/21/2021 CLINICAL DATA:  Left thoracentesis EXAM: PORTABLE CHEST 1 VIEW COMPARISON:  05/20/2021 FINDINGS: Chronic Cardiopericardial enlargement. Decrease in left pleural effusion. Stable reticulation bilaterally. No visible re-expansion edema. No pneumothorax. IMPRESSION: No complicating features of left thoracentesis. Electronically Signed   By: Monte Fantasia M.D.   On: 05/21/2021 10:20   ECHOCARDIOGRAM COMPLETE  Result Date: 05/17/2021    ECHOCARDIOGRAM REPORT   Patient Name:   CLAUDENE GATLIFF Date of Exam: 05/17/2021 Medical Rec #:  [id number]          Height:  62.0 in Accession #:    [id number]         Weight:       228.0 lb Date of Birth:  1946/09/02           BSA:          2.021 m Patient Age:    75 years           BP:           110/88 mmHg Patient Gender: F                  HR:           77 bpm. Exam Location:  [facility] Procedure: 2D Echo, Cardiac Doppler and Color Doppler Indications:     CHF-acute diastolic O27.03  History:         Patient has prior history of Echocardiogram examinations, most                  recent 12/01/2019. Risk Factors:Hypertension and Diabetes.  Sonographer:     Sherrie Sport RDCS (AE) Referring Phys:  [id number] [name] Diagnosing Phys: Ida Rogue MD  Sonographer Comments: Suboptimal apical window and suboptimal parasternal window. IMPRESSIONS  1. Left ventricular ejection fraction, by estimation, is 60 to 65%. The left ventricle has normal function. The left ventricle has no regional wall motion  abnormalities. Left ventricular diastolic parameters are indeterminate.  2. Right ventricular systolic function is normal. The right ventricular size is normal. Tricuspid regurgitation signal is inadequate for assessing PA pressure.  3. Left atrial size was mild to moderately dilated.  4. Left pleural effusion noted FINDINGS  Left Ventricle: Left ventricular ejection fraction, by estimation, is 60 to 65%. The left ventricle has normal function. The left ventricle has no regional wall motion abnormalities. The left ventricular internal cavity size was normal in size. There is  no left ventricular hypertrophy. Left ventricular diastolic parameters are indeterminate. Right Ventricle: The right ventricular size is normal. No increase in right ventricular wall thickness. Right ventricular systolic function is normal. Tricuspid regurgitation signal is inadequate for assessing PA pressure. Left Atrium: Left atrial size was moderately dilated. Right Atrium: Right atrial size was normal in size. Pericardium: There is no evidence of pericardial effusion. Mitral Valve: The mitral valve is normal in structure. Mild mitral annular calcification. No evidence of mitral valve regurgitation. No evidence of mitral valve stenosis. Tricuspid Valve: The tricuspid valve is normal in structure. Tricuspid valve regurgitation is mild . No evidence of tricuspid stenosis. Aortic Valve: The aortic valve is normal in structure. Aortic valve regurgitation is not visualized. No aortic stenosis is present. Aortic valve mean gradient measures 9.3 mmHg. Aortic valve peak gradient measures 16.4 mmHg. Aortic valve area, by VTI measures 1.26 cm. Pulmonic Valve: The pulmonic valve was normal in structure. Pulmonic valve regurgitation is not visualized. No evidence of pulmonic stenosis. Aorta: The aortic root is normal in size and structure. Venous: The inferior vena cava is normal in size with greater than 50% respiratory variability, suggesting right  atrial pressure of 3 mmHg. IAS/Shunts: No atrial level shunt detected by color flow Doppler.  LEFT VENTRICLE PLAX 2D LVIDd:         3.83 cm LVIDs:         2.37 cm LV PW:         1.15 cm LV IVS:        0.95 cm LVOT diam:     2.00 cm LV SV:  49 LV SV Index:   24 LVOT Area:     3.14 cm  RIGHT VENTRICLE RV Basal diam:  2.71 cm LEFT ATRIUM              Index       RIGHT ATRIUM           Index LA diam:        5.40 cm  2.67 cm/m  RA Area:     13.20 cm LA Vol (A2C):   95.6 ml  47.30 ml/m RA Volume:   30.30 ml  14.99 ml/m LA Vol (A4C):   94.6 ml  46.80 ml/m LA Biplane Vol: 102.0 ml 50.47 ml/m  AORTIC VALVE                    PULMONIC VALVE AV Area (Vmax):    1.24 cm     PV Vmax:        0.60 m/s AV Area (Vmean):   1.24 cm     PV Peak grad:   1.4 mmHg AV Area (VTI):     1.26 cm     RVOT Peak grad: 2 mmHg AV Vmax:           202.33 cm/s AV Vmean:          143.000 cm/s AV VTI:            0.386 m AV Peak Grad:      16.4 mmHg AV Mean Grad:      9.3 mmHg LVOT Vmax:         79.60 cm/s LVOT Vmean:        56.600 cm/s LVOT VTI:          0.155 m LVOT/AV VTI ratio: 0.40  AORTA Ao Root diam: 2.60 cm MITRAL VALVE                TRICUSPID VALVE MV Area (PHT): 3.97 cm     TR Peak grad:   17.0 mmHg MV Decel Time: 191 msec     TR Vmax:        206.00 cm/s MV E velocity: 132.00 cm/s                             SHUNTS                             Systemic VTI:  0.16 m                             Systemic Diam: 2.00 cm Ida Rogue MD Electronically signed by Ida Rogue MD Signature Date/Time: 05/17/2021/1:55:11 PM    Final    US THORACENTESIS ASP PLEURAL SPACE W/IMG GUIDE  Result Date: 05/21/2021 INDICATION: Shortness of breath. Left-sided pleural effusion. Request diagnostic and therapeutic THORACENTESIS. EXAM: ULTRASOUND GUIDED LEFT THORACENTESIS MEDICATIONS: 1% plain lidocaine, 5 mL COMPLICATIONS: None immediate. PROCEDURE: An ultrasound guided thoracentesis was thoroughly discussed with the patient and questions  answered. The benefits, risks, alternatives and complications were also discussed. The patient understands and wishes to proceed with the procedure. Written consent was obtained. Ultrasound was performed to localize and mark an adequate pocket of fluid in the left chest. The area was then prepped and draped in the normal sterile fashion. 1% Lidocaine was used for local anesthesia. Under ultrasound guidance a 6 Fr  Safe-T-Centesis catheter was introduced. Thoracentesis was performed. The catheter was removed and a dressing applied. FINDINGS: A total of approximately 400 mL of clear yellow fluid was removed. Samples were sent to the laboratory as requested by the clinical team. IMPRESSION: Successful ultrasound guided left thoracentesis yielding 400 mL of pleural fluid. Read by: Ascencion Dike PA-C Electronically Signed   By: Jacqulynn Cadet M.D.   On: 05/21/2021 10:29      Subjective: Feels the pump is helping with her lower extremity edema.  Seeing improvement.  Shortness of breath at baseline and clinically feels better.  Discharge Exam: Vitals:   05/27/21 0929 05/27/21 1153  BP: 111/66 112/72  Pulse: 73 72  Resp:  17  Temp:  97.7 F (36.5 C)  SpO2: 94% 95%   Vitals:   05/27/21 0443 05/27/21 0736 05/27/21 0929 05/27/21 1153  BP: 114/61 101/63 111/66 112/72  Pulse: 60 62 73 72  Resp: 20 18  17   Temp: 97.7 F (36.5 C) 97.8 F (36.6 C)  97.7 F (36.5 C)  TempSrc:      SpO2: 92% 96% 94% 95%  Weight:      Height:        General: Pt is alert, awake, not in acute distress Cardiovascular: RRR, S1/S2 +, no rubs, no gallops Respiratory: CTA bilaterally, no wheezing, no rhonchi Abdominal: Soft, NT, ND, bowel sounds + Extremities: Decreased lower extremity edema    The results of significant diagnostics from this hospitalization (including imaging, microbiology, ancillary and laboratory) are listed below for reference.     Microbiology: No results found for this or any previous visit  (from the past 240 hour(s)).   Labs: BNP (last 3 results) Recent Labs    04/10/21 2258 05/16/21 1740  BNP 94.5 417.4*   Basic Metabolic Panel: Recent Labs  Lab 05/22/21 1116 05/23/21 0423 05/25/21 0449 05/26/21 0443 05/26/21 0905 05/27/21 0425  NA 136 136 136  --  131* 129*  K 3.8 3.6 2.5*  --  2.9* 3.5  CL 97* 96* 88*  --  86* 89*  CO2 32 31 33*  --  32 33*  GLUCOSE 182* 136* 135*  --  188* 133*  BUN 30* 31* 41*  --  41* 44*  CREATININE 1.41* 1.45* 1.54*  --  1.58* 1.73*  CALCIUM 9.1 9.0 9.4  --  9.1 9.3  MG  --   --   --  2.0  --   --    Liver Function Tests: No results for input(s): AST, ALT, ALKPHOS, BILITOT, PROT, ALBUMIN in the last 168 hours. No results for input(s): LIPASE, AMYLASE in the last 168 hours. No results for input(s): AMMONIA in the last 168 hours. CBC: Recent Labs  Lab 05/26/21 0443  WBC 6.3  HGB 12.0  HCT 37.0  MCV 84.3  PLT 277   Cardiac Enzymes: No results for input(s): CKTOTAL, CKMB, CKMBINDEX, TROPONINI in the last 168 hours. BNP: Invalid input(s): POCBNP CBG: Recent Labs  Lab 05/26/21 1154 05/26/21 1623 05/26/21 2013 05/27/21 0737 05/27/21 1131  GLUCAP 176* 153* 162* 145* 161*   D-Dimer No results for input(s): DDIMER in the last 72 hours. Hgb A1c No results for input(s): HGBA1C in the last 72 hours. Lipid Profile No results for input(s): CHOL, HDL, LDLCALC, TRIG, CHOLHDL, LDLDIRECT in the last 72 hours. Thyroid function studies No results for input(s): TSH, T4TOTAL, T3FREE, THYROIDAB in the last 72 hours.  Invalid input(s): FREET3 Anemia work up No results for input(s): VITAMINB12, FOLATE,  FERRITIN, TIBC, IRON, RETICCTPCT in the last 72 hours. Urinalysis    Component Value Date/Time   COLORURINE YELLOW (A) 04/11/2021 1215   APPEARANCEUR HAZY (A) 04/11/2021 1215   APPEARANCEUR Clear 06/06/2020 1339   LABSPEC 1.011 04/11/2021 1215   PHURINE 5.0 04/11/2021 1215   GLUCOSEU NEGATIVE 04/11/2021 1215   HGBUR NEGATIVE  04/11/2021 1215   BILIRUBINUR NEGATIVE 04/11/2021 1215   BILIRUBINUR Negative 06/06/2020 1339   KETONESUR NEGATIVE 04/11/2021 1215   PROTEINUR NEGATIVE 04/11/2021 1215   NITRITE NEGATIVE 04/11/2021 1215   LEUKOCYTESUR MODERATE (A) 04/11/2021 1215   Sepsis Labs Invalid input(s): PROCALCITONIN,  WBC,  LACTICIDVEN Microbiology No results found for this or any previous visit (from the past 240 hour(s)).   Time coordinating discharge: Over 30 minutes  SIGNED:   Nolberto Hanlon, MD  [facility] 05/27/2021, 5:07 PM Pager   If 7PM-7AM, please contact night-coverage [email or web address] Password [email or web address]

## 2021-05-27 NOTE — Progress Notes (Deleted)
PROGRESS NOTE    Karen Farley  QMG:[id number] DOB: 02/13/1946 DOA: 05/17/2021 PCP: Valerie Roys, DO    Brief Narrative:   Karen Farley is a 75 year old woman with acute on chronic HFpEF and permanent atrial fibrillation  05/23/2021 feels better but feels like she is not urinating as much as she is getting IV Lasix.  Still not at baseline. 8/4 patient feeling better.  Still with lower extremity edema.  Reports urine output better.  Also discussed with patient to use her home lymphedema pump 4 times a day for 1 hour.  Discussed this with nursing 2.  8/5 wearing her lymphedema pump this am.   8/6 using lymphedema pump 3 times daily and has noticed leg swelling are getting better  8/7 no overnight issues   Consultants:  Cardiology With history nephrology Procedures:   Antimicrobials:      Subjective: Feels better. Less sob. Using lymphe. pump.  Feels lower extremity edema with the pump is improving  Objective: Vitals:   05/27/21 0443 05/27/21 0736 05/27/21 0929 05/27/21 1153  BP: 114/61 101/63 111/66 112/72  Pulse: 60 62 73 72  Resp: 20 18  17   Temp: 97.7 F (36.5 C) 97.8 F (36.6 C)  97.7 F (36.5 C)  TempSrc:      SpO2: 92% 96% 94% 95%  Weight:      Height:        Intake/Output Summary (Last 24 hours) at 05/27/2021 1204 Last data filed at 05/27/2021 0736 Gross per 24 hour  Intake --  Output 3600 ml  Net -3600 ml   Filed Weights   05/25/21 1047 05/26/21 0520 05/27/21 0213  Weight: 98.8 kg 99.5 kg 99.2 kg    Examination: NAD, calm CTA no wheeze rales rhonchi's irreg S1-S2 no gallops Soft benign positive bowel sounds Pump in place lower extremity aaxox4   Data Reviewed: I have personally reviewed following labs and imaging studies  CBC: Recent Labs  Lab 05/26/21 0443  WBC 6.3  HGB 12.0  HCT 37.0  MCV 84.3  PLT 326   Basic Metabolic Panel: Recent Labs  Lab 05/22/21 1116 05/23/21 0423 05/25/21 0449 05/26/21 0443 05/26/21 0905  05/27/21 0425  NA 136 136 136  --  131* 129*  K 3.8 3.6 2.5*  --  2.9* 3.5  CL 97* 96* 88*  --  86* 89*  CO2 32 31 33*  --  32 33*  GLUCOSE 182* 136* 135*  --  188* 133*  BUN 30* 31* 41*  --  41* 44*  CREATININE 1.41* 1.45* 1.54*  --  1.58* 1.73*  CALCIUM 9.1 9.0 9.4  --  9.1 9.3  MG  --   --   --  2.0  --   --    GFR: Estimated Creatinine Clearance: 30.9 mL/min (A) (by C-G formula based on SCr of 1.73 mg/dL (H)). Liver Function Tests: No results for input(s): AST, ALT, ALKPHOS, BILITOT, PROT, ALBUMIN in the last 168 hours. No results for input(s): LIPASE, AMYLASE in the last 168 hours. No results for input(s): AMMONIA in the last 168 hours. Coagulation Profile: No results for input(s): INR, PROTIME in the last 168 hours. Cardiac Enzymes: No results for input(s): CKTOTAL, CKMB, CKMBINDEX, TROPONINI in the last 168 hours. BNP (last 3 results) No results for input(s): PROBNP in the last 8760 hours. HbA1C: No results for input(s): HGBA1C in the last 72 hours. CBG: Recent Labs  Lab 05/26/21 1154 05/26/21 1623 05/26/21 2013 05/27/21 0737 05/27/21 1131  GLUCAP 176* 153* 162* 145* 161*   Lipid Profile: No results for input(s): CHOL, HDL, LDLCALC, TRIG, CHOLHDL, LDLDIRECT in the last 72 hours. Thyroid Function Tests: No results for input(s): TSH, T4TOTAL, FREET4, T3FREE, THYROIDAB in the last 72 hours. Anemia Panel: No results for input(s): VITAMINB12, FOLATE, FERRITIN, TIBC, IRON, RETICCTPCT in the last 72 hours. Sepsis Labs: No results for input(s): PROCALCITON, LATICACIDVEN in the last 168 hours.   No results found for this or any previous visit (from the past 240 hour(s)).        Radiology Studies: No results found.      Scheduled Meds:  acidophilus  1 capsule Oral TID   apixaban  5 mg Oral BID   cephALEXin  500 mg Oral Q6H   cholecalciferol  2,000 Units Oral Daily   insulin aspart  0-15 Units Subcutaneous TID WC   insulin aspart  0-5 Units Subcutaneous  QHS   latanoprost  1 drop Both Eyes QHS   metoprolol succinate  50 mg Oral Daily   multivitamin with minerals  1 tablet Oral Daily   spironolactone  25 mg Oral Daily   torsemide  20 mg Oral Daily   Continuous Infusions:    Assessment & Plan:   Principal Problem:   Volume overload Active Problems:   Chronic venous insufficiency   Atrial fibrillation (HCC)   Type 2 diabetes mellitus (HCC)   PAD (peripheral artery disease) (HCC)   Stage 3b chronic kidney disease (HCC)   Pleural effusion   Acute on chronic diastolic CHF (congestive heart failure) (HCC)   Obesity, Class III, BMI 40-49.9 (morbid obesity) ([place])   Chronic anticoagulation   Anasarca   Karen Farley is a 75 y.o. female with medical history significant for HFpEF (EF 60-65% on echo 11/2019), atrial fibrillation on Eliquis, essential hypertension, hyperlipidemia, chronic lymphedema, chronic venous insufficiency, PAD, type 2 diabetes, CKD stage IIIb is admitted for heart failure exacerbation.  She was recently hospitalized in March 22, 2001 2 in the setting of hypotension and acute kidney injury requiring reduction in diuretic dose of torsemide 20 mg 3 times a week.  She continued to get more lower extremity swelling, weight gain and increase in abdominal girth.   Anasarca Acute on chronic diastolic CHF Left Pleural effusion - Patient with fluid retention not responding to outpatient increase in torsemide, - CXRon admisison with left pleural effusion -moderate 8/7 clinically improved Cardiology following-stopped metolazone due to worsening of creatinine and recommend continuing torsemide 20 mg daily I's and O's  continue on spironolactone   Hypokalemia- Supplemented now stable Continue to monitor   Permanent atrial fibrillation (HCC) On chronic anticoagulation Heart rate control  Hyponatremia Sodium 129, asymptomatic Likely from diuresis Continue to monitor     Type 2 diabetes mellitus (HCC) BG  stable Continue R-ISS      acute on CKD stage 3b  Mildly worsened due to diuresis Nephrology following Nephrology had added Aldactone Potassium stable Metolazone discontinued, continue torsemide 20 mg daily per cards Will follow with nephrology for any new recommendation     Obesity, Class III, BMI 40-49.9 (morbid obesity) ([place]) - Complicating factor to overall prognosis and care   Rt index finger-mildly red and swollen from fingersticks being check on the finger questionable cellulitis Improving with Keflex, continue.  Apply warm compresses  Lymphedema Continue pump TID. At home should be qid.  DVT prophylaxis: Eliquis Code Status: Full Family Communication: None at bedside Disposition Plan:  Status is: Inpatient  Remains inpatient appropriate  because:Inpatient level of care appropriate due to severity of illness  Dispo: The patient is from: Home              Anticipated d/c is to: Home              Patient currently is not medically stable to d/c.   Difficult to place patient No            LOS: 8 days   Time spent: 45 minutes with more than 50% on [place], MD [facility] Pager [phone]  If 7PM-7AM, please contact night-coverage

## 2021-05-27 NOTE — Progress Notes (Addendum)
[facility]  ROUNDING NOTE   Subjective:   Karen Farley is a 75 y.o. female with past medical history of diastolic CHF, diabetes, lymphedema, Atrial fib on Eliquis and CKD 3B. She presents to the ED with increased lower extremity edema and shortness of breath. She has been admitted for Anasarca [R60.1] Peripheral edema [R60.9] Pleural effusion [J90] Volume overload [E87.70]   Patient is seen in our office by Dr Candiss Norse. She had a recent admission for hypotension and AKI due to diuretic intolerance.   Patient sitting up in chair with lymphedema pumps on Alert and oriented Tolerating meals Feels better than when she arrived Agreeable for discharge  UOP 3.7L in last 24 hours   Objective:  Vital signs in last 24 hours:  Temp:  [97.5 F (36.4 C)-98 F (36.7 C)] 97.7 F (36.5 C) (08/07 1153) Pulse Rate:  [60-77] 72 (08/07 1153) Resp:  [17-20] 17 (08/07 1153) BP: (96-114)/(52-72) 112/72 (08/07 1153) SpO2:  [92 %-96 %] 95 % (08/07 1153) Weight:  [99.2 kg] 99.2 kg (08/07 0213)  Weight change: 0.408 kg Filed Weights   05/25/21 1047 05/26/21 0520 05/27/21 0213  Weight: 98.8 kg 99.5 kg 99.2 kg    Intake/Output: I/O last 3 completed shifts: In: 240 [P.O.:240] Out: 4400 [Urine:4400]   Intake/Output this shift:  Total I/O In: -  Out: 200 [Urine:200]  Physical Exam: General: NAD, sitting up in chair  Head: Normocephalic, atraumatic. Moist oral mucosal membranes  Eyes: Anicteric  Lungs:  Clear bilaterally, normal effort  Heart: Regular rate and rhythm  Abdomen:  Soft, nontender, distended  Extremities:  1+ peripheral edema.  Neurologic: Alert, moving all four extremities  Skin: No lesions       Basic Metabolic Panel: Recent Labs  Lab 05/22/21 1116 05/23/21 0423 05/25/21 0449 05/26/21 0443 05/26/21 0905 05/27/21 0425  NA 136 136 136  --  131* 129*  K 3.8 3.6 2.5*  --  2.9* 3.5  CL 97* 96* 88*  --  86* 89*  CO2 32 31 33*  --  32 33*  GLUCOSE  182* 136* 135*  --  188* 133*  BUN 30* 31* 41*  --  41* 44*  CREATININE 1.41* 1.45* 1.54*  --  1.58* 1.73*  CALCIUM 9.1 9.0 9.4  --  9.1 9.3  MG  --   --   --  2.0  --   --      Liver Function Tests: No results for input(s): AST, ALT, ALKPHOS, BILITOT, PROT, ALBUMIN in the last 168 hours. No results for input(s): LIPASE, AMYLASE in the last 168 hours. No results for input(s): AMMONIA in the last 168 hours.  CBC: Recent Labs  Lab 05/26/21 0443  WBC 6.3  HGB 12.0  HCT 37.0  MCV 84.3  PLT 277     Cardiac Enzymes: No results for input(s): CKTOTAL, CKMB, CKMBINDEX, TROPONINI in the last 168 hours.  BNP: Invalid input(s): POCBNP  CBG: Recent Labs  Lab 05/26/21 1154 05/26/21 1623 05/26/21 2013 05/27/21 0737 05/27/21 1131  GLUCAP 176* 153* 162* 145* 161*     Microbiology: Results for orders placed or performed during the hospital encounter of 05/17/21  Resp Panel by RT-PCR (Flu A&B, Covid) Nasopharyngeal Swab     Status: None   Collection Time: 05/17/21  5:20 AM   Specimen: Nasopharyngeal Swab; Nasopharyngeal(NP) swabs in vial transport medium  Result Value Ref Range Status   SARS Coronavirus 2 by RT PCR NEGATIVE NEGATIVE Final    Comment: (  NOTE) SARS-CoV-2 target nucleic acids are NOT DETECTED.  The SARS-CoV-2 RNA is generally detectable in upper respiratory specimens during the acute phase of infection. The lowest concentration of SARS-CoV-2 viral copies this assay can detect is 138 copies/mL. A negative result does not preclude SARS-Cov-2 infection and should not be used as the sole basis for treatment or other patient management decisions. A negative result may occur with  improper specimen collection/handling, submission of specimen other than nasopharyngeal swab, presence of viral mutation(s) within the areas targeted by this assay, and inadequate number of viral copies(<138 copies/mL). A negative result must be combined with clinical observations, patient  history, and epidemiological information. The expected result is Negative.  Fact Sheet for Patients:  EntrepreneurPulse.com.au  Fact Sheet for Healthcare Providers:  IncredibleEmployment.be  This test is no t yet approved or cleared by the [place] FDA and  has been authorized for detection and/or diagnosis of SARS-CoV-2 by FDA under an Emergency Use Authorization (EUA). This EUA will remain  in effect (meaning this test can be used) for the duration of the COVID-19 declaration under Section 564(b)(1) of the Act, 21 U.S.C.section 360bbb-3(b)(1), unless the authorization is terminated  or revoked sooner.       Influenza A by PCR NEGATIVE NEGATIVE Final   Influenza B by PCR NEGATIVE NEGATIVE Final    Comment: (NOTE) The Xpert Xpress SARS-CoV-2/FLU/RSV plus assay is intended as an aid in the diagnosis of influenza from Nasopharyngeal swab specimens and should not be used as a sole basis for treatment. Nasal washings and aspirates are unacceptable for Xpert Xpress SARS-CoV-2/FLU/RSV testing.  Fact Sheet for Patients: EntrepreneurPulse.com.au  Fact Sheet for Healthcare Providers: IncredibleEmployment.be  This test is not yet approved or cleared by the [place] FDA and has been authorized for detection and/or diagnosis of SARS-CoV-2 by FDA under an Emergency Use Authorization (EUA). This EUA will remain in effect (meaning this test can be used) for the duration of the COVID-19 declaration under Section 564(b)(1) of the Act, 21 U.S.C. section 360bbb-3(b)(1), unless the authorization is terminated or revoked.  Performed at [facility], [address] [place], [place] [address]     Coagulation Studies: No results for input(s): LABPROT, INR in the last 72 hours.  Urinalysis: No results for input(s): COLORURINE, LABSPEC, PHURINE, GLUCOSEU, HGBUR, BILIRUBINUR, KETONESUR, PROTEINUR,  UROBILINOGEN, NITRITE, LEUKOCYTESUR in the last 72 hours.  Invalid input(s): APPERANCEUR    Imaging: No results found.   Medications:      acidophilus  1 capsule Oral TID   apixaban  5 mg Oral BID   cephALEXin  500 mg Oral Q6H   cholecalciferol  2,000 Units Oral Daily   insulin aspart  0-15 Units Subcutaneous TID WC   insulin aspart  0-5 Units Subcutaneous QHS   latanoprost  1 drop Both Eyes QHS   metoprolol succinate  50 mg Oral Daily   multivitamin with minerals  1 tablet Oral Daily   spironolactone  25 mg Oral Daily   torsemide  20 mg Oral Daily   acetaminophen **OR** acetaminophen, ondansetron **OR** ondansetron (ZOFRAN) IV  Assessment/ Plan:  Karen Farley is a 75 y.o.  female past medical history of diastolic CHF, diabetes, lymphedema, Atrial fib on Eliquis and CKD 3B. She presents to the ED with increased lower extremity edema and shortness of breath. She has been admitted for Anasarca [R60.1] Peripheral edema [R60.9] Pleural effusion [J90] Volume overload [E87.70]   Chronic Kidney disease 3B Followed by [facility] Dr Candiss Norse.  Recent admission for AKI. Discharged on Torsemide 20mg  TID. Furosemide and Metolazone held due to elevated creatine and hypokalemia. Spironolactone to 50mg  yesterday.. Continue lymphedema pumps at recommended TID schedule. Will schedule patient for follow up appt this week in our office. Due to hypotension and hyponatremia, will hold diuretics at discharge and evaluate resuming them at office follow up.  Lab Results  Component Value Date   CREATININE 1.73 (H) 05/27/2021   CREATININE 1.58 (H) 05/26/2021   CREATININE 1.54 (H) 05/25/2021    Intake/Output Summary (Last 24 hours) at 05/27/2021 1309 Last data filed at 05/27/2021 0736 Gross per 24 hour  Intake --  Output 3600 ml  Net -3600 ml    2. .Diabetes mellitus type II with chronic kidney disease  noninsulin dependent. Most recent hemoglobin A1c is 6.6 on 05/17/21. Glucose stable. Primary  team to manage SSI.  3.diastolic Heart Failure ECHO on 04/27/21 shows EF 60-65% Will monitor  Cardiology follow up at discharge  4. Hypokalemia, likely due to aggressive diuresis 3.5 improved  Oral and IV supplementation ordered Reduced to Spironolactone 25mg  daily.    LOS: 8 Iysis Germain 8/7/20221:09 PM

## 2021-05-27 NOTE — Progress Notes (Signed)
Patient discharged home via personal vehicle. IV removed. All belongings sent with patient. Discharge went over with opportunity to ask questions. Patient aware that she should not take lasix, torsemide, metolazone, and spironolactone until she sees nephrology. Patient states she understands. Home Health set up per case management note.

## 2021-05-27 NOTE — Evaluation (Signed)
Occupational Therapy Evaluation Patient Details Name: MCKINZIE SAKSA MRN: [id number] DOB: [birth year] Today's Date: 05/27/2021    History of Present Illness Per MD notes, pt is a 75 y.o. female who is admitted for heart failure exacerbation. PMH includes HFpEF (EF 60-65% on echo 11/2019), atrial fibrillation on Eliquis, essential hypertension, hyperlipidemia, chronic lymphedema, chronic venous insufficiency, PAD, type 2 diabetes, CKD stage IIIb. MD assessment includes anasarca, acute on chronic diastolic CHF, left pleural effusion, and stage 3b chronic kidney disease.   Clinical Impression   Pt seen for OT evaluation this date. At baseline, pt was independent with ADLs and functional mobility of household distances, occasionally using SPC for community mobility. At baseline, pt's husband assists with IADLs d/t pt's chronic back pain. Since Jun 2022 admission, pt was using RW for functional mobility of household distances d/t decreased activity tolerance, but reports that she was gradually decreasing RW use to regain baseline independence with mobility. Pt reports that since June 2022 admission, she was independent with ADLs.   Pt currently presents with decreased balanced and activity tolerance, and requires SUPERVISION for standing grooming tasks, MAX A to don compression socks, SET-UP A to don/doff shoes, and SUPERVISION for functional mobility of short household distances without AD. Pt would benefit from additional skilled OT services to maximize return to PLOF and minimize risk of future falls, injury, caregiver burden, and readmission. Upon discharge, recommend no OT follow up.      Follow Up Recommendations  No OT follow up;Supervision - Intermittent    Equipment Recommendations  None recommended by OT       Precautions / Restrictions Precautions Precautions: Fall Restrictions Weight Bearing Restrictions: No      Mobility Bed Mobility               General bed mobility  comments: not assess, pt seated at EOB upon arrival    Transfers Overall transfer level: Needs assistance Equipment used: None Transfers: Sit to/from Stand Sit to Stand: Supervision              Balance Overall balance assessment: Needs assistance Sitting-balance support: Feet supported;No upper extremity supported Sitting balance-Leahy Scale: Good Sitting balance - Comments: good balance reaching within BOS while sitting EOB   Standing balance support: Single extremity supported;During functional activity Standing balance-Leahy Scale: Good Standing balance comment: With fatigue, pt seeking intermittent unilateral UE support from furniture to maintain balance                           ADL either performed or assessed with clinical judgement   ADL Overall ADL's : Needs assistance/impaired     Grooming: Wash/dry face;Oral care;Supervision/safety;Standing               Lower Body Dressing: Moderate assistance;Sitting/lateral leans Lower Body Dressing Details (indicate cue type and reason): MAX A for donning compression socks. SET-UP assist to don/doff shoes             Functional mobility during ADLs: Supervision/safety        Pertinent Vitals/Pain Pain Assessment: Faces Faces Pain Scale: Hurts even more Pain Location: Lower back pain (chronic) Pain Descriptors / Indicators: Aching;Sore Pain Intervention(s): Monitored during session     Hand Dominance Right   Extremity/Trunk Assessment Upper Extremity Assessment Upper Extremity Assessment: Overall WFL for tasks assessed   Lower Extremity Assessment Lower Extremity Assessment: Generalized weakness       Communication Communication Communication: No difficulties   Cognition  Arousal/Alertness: Awake/alert Behavior During Therapy: WFL for tasks assessed/performed Overall Cognitive Status: Within Functional Limits for tasks assessed                                                 Home Living Family/patient expects to be discharged to:: Private residence Living Arrangements: Spouse/significant other Available Help at Discharge: Family;Available 24 hours/day Type of Home: House Home Access: Level entry     Home Layout: One level     Bathroom Shower/Tub: Occupational psychologist: Standard Bathroom Accessibility: Yes   Home Equipment: Environmental consultant - 2 wheels;Cane - single point;Shower seat - built in   Additional Comments: Pt has LE compression devices she uses twice per day at home for an hour at a time for edema.      Prior Functioning/Environment Level of Independence: Independent with assistive device(s)        Comments: Pt has been using RW since hospitalization in June but has gradually been using it less and is at prior baseline of no AD for short household distances and [facility] for longer distances or outside.  No home O2. No fall history. Husband assists with household chores, transportation and IADLs as needed. Pt reports just starting HHPT 1x per week prior to current admission and would like to continue after d/c.        OT Problem List: Decreased activity tolerance;Impaired balance (sitting and/or standing);Pain      OT Treatment/Interventions: Self-care/ADL training;Energy conservation;Therapeutic activities;Balance training;Patient/family education    OT Goals(Current goals can be found in the care plan section) Acute Rehab OT Goals Patient Stated Goal: to get home today OT Goal Formulation: With patient Time For Goal Achievement: 06/10/21 Potential to Achieve Goals: Fair ADL Goals Pt Will Perform Grooming: with modified independence;standing Pt Will Perform Lower Body Dressing: with min assist;sitting/lateral leans Pt Will Transfer to Toilet: ambulating;regular height toilet;with modified independence  OT Frequency: Min 1X/week    AM-PAC OT "6 Clicks" Daily Activity     Outcome Measure Help from another person eating  meals?: None Help from another person taking care of personal grooming?: A Little Help from another person toileting, which includes using toliet, bedpan, or urinal?: A Little Help from another person bathing (including washing, rinsing, drying)?: A Lot Help from another person to put on and taking off regular upper body clothing?: A Little Help from another person to put on and taking off regular lower body clothing?: A Lot 6 Click Score: 17   End of Session Nurse Communication: Mobility status  Activity Tolerance: Patient tolerated treatment well Patient left: in chair;with call bell/phone within reach  OT Visit Diagnosis: Unsteadiness on feet (R26.81)                Time: 5176-1607 OT Time Calculation (min): 19 min Charges:  OT General Charges $OT Visit: 1 Visit OT Evaluation $OT Eval Moderate Complexity: 1 Mod OT Treatments $Self Care/Home Management : 8-22 mins  Fredirick Maudlin, OTR/L [place]

## 2021-05-28 ENCOUNTER — Telehealth: Payer: Self-pay

## 2021-05-28 NOTE — Telephone Encounter (Signed)
Transition Care Management Unsuccessful Follow-up Telephone Call  Date of discharge and from where:  05/27/2021 [facility]  Attempts:  1st Attempt  Reason for unsuccessful TCM follow-up call:  Left voice message

## 2021-05-28 NOTE — Telephone Encounter (Signed)
Transition Care Management Follow-up Telephone Call Date of discharge and from where: 05/27/2021 [facility] How have you been since you were released from the hospital? Still a little tired, but doing better Any questions or concerns? No  Items Reviewed: Did the pt receive and understand the discharge instructions provided? Yes  Medications obtained and verified? Yes  Other? No  Any new allergies since your discharge? No  Dietary orders reviewed? Yes Do you have support at home? Yes   Home Care and Equipment/Supplies: Were home health services ordered? not applicable If so, what is the name of the agency? N/a  Has the agency set up a time to come to the patient's home? not applicable Were any new equipment or medical supplies ordered?  No What is the name of the medical supply agency? N/a Were you able to get the supplies/equipment? not applicable Do you have any questions related to the use of the equipment or supplies? No  Functional Questionnaire: (I = Independent and D = Dependent) ADLs: I  Bathing/Dressing- I  Meal Prep- I  Eating- I  Maintaining continence- I  Transferring/Ambulation- I  Managing Meds- I  Follow up appointments reviewed:  PCP Hospital f/u appt confirmed? Yes  Scheduled to see Dr. Wynetta Emery on 05/27/2021 @ 4:00. Are transportation arrangements needed? No  If their condition worsens, is the pt aware to call PCP or go to the Emergency Dept.? Yes Was the patient provided with contact information for the PCP's office or ED? Yes Was to pt encouraged to call back with questions or concerns? Yes

## 2021-05-29 DIAGNOSIS — E1151 Type 2 diabetes mellitus with diabetic peripheral angiopathy without gangrene: Secondary | ICD-10-CM | POA: Diagnosis not present

## 2021-05-29 DIAGNOSIS — I890 Lymphedema, not elsewhere classified: Secondary | ICD-10-CM | POA: Diagnosis not present

## 2021-05-29 DIAGNOSIS — I4891 Unspecified atrial fibrillation: Secondary | ICD-10-CM | POA: Diagnosis not present

## 2021-05-29 DIAGNOSIS — I509 Heart failure, unspecified: Secondary | ICD-10-CM | POA: Diagnosis not present

## 2021-05-29 DIAGNOSIS — N189 Chronic kidney disease, unspecified: Secondary | ICD-10-CM | POA: Diagnosis not present

## 2021-05-29 DIAGNOSIS — E1122 Type 2 diabetes mellitus with diabetic chronic kidney disease: Secondary | ICD-10-CM | POA: Diagnosis not present

## 2021-05-29 DIAGNOSIS — I130 Hypertensive heart and chronic kidney disease with heart failure and stage 1 through stage 4 chronic kidney disease, or unspecified chronic kidney disease: Secondary | ICD-10-CM | POA: Diagnosis not present

## 2021-05-29 DIAGNOSIS — I872 Venous insufficiency (chronic) (peripheral): Secondary | ICD-10-CM | POA: Diagnosis not present

## 2021-05-29 DIAGNOSIS — K5900 Constipation, unspecified: Secondary | ICD-10-CM | POA: Diagnosis not present

## 2021-05-31 DIAGNOSIS — N1832 Chronic kidney disease, stage 3b: Secondary | ICD-10-CM | POA: Diagnosis not present

## 2021-05-31 DIAGNOSIS — I890 Lymphedema, not elsewhere classified: Secondary | ICD-10-CM | POA: Diagnosis not present

## 2021-05-31 DIAGNOSIS — I10 Essential (primary) hypertension: Secondary | ICD-10-CM | POA: Diagnosis not present

## 2021-05-31 DIAGNOSIS — R600 Localized edema: Secondary | ICD-10-CM | POA: Diagnosis not present

## 2021-05-31 DIAGNOSIS — E1122 Type 2 diabetes mellitus with diabetic chronic kidney disease: Secondary | ICD-10-CM | POA: Diagnosis not present

## 2021-06-01 ENCOUNTER — Ambulatory Visit: Payer: Medicare PPO | Admitting: Family Medicine

## 2021-06-01 ENCOUNTER — Encounter: Payer: Self-pay | Admitting: Family Medicine

## 2021-06-01 ENCOUNTER — Other Ambulatory Visit: Payer: Self-pay

## 2021-06-01 VITALS — BP 96/67 | HR 92 | Temp 98.0°F | Wt 218.2 lb

## 2021-06-01 DIAGNOSIS — I5033 Acute on chronic diastolic (congestive) heart failure: Secondary | ICD-10-CM

## 2021-06-01 DIAGNOSIS — L03011 Cellulitis of right finger: Secondary | ICD-10-CM

## 2021-06-01 DIAGNOSIS — E876 Hypokalemia: Secondary | ICD-10-CM | POA: Diagnosis not present

## 2021-06-01 DIAGNOSIS — J90 Pleural effusion, not elsewhere classified: Secondary | ICD-10-CM

## 2021-06-01 DIAGNOSIS — E871 Hypo-osmolality and hyponatremia: Secondary | ICD-10-CM

## 2021-06-01 DIAGNOSIS — N1832 Chronic kidney disease, stage 3b: Secondary | ICD-10-CM

## 2021-06-01 NOTE — Progress Notes (Signed)
BP 96/67   Pulse 92   Temp 98 F (36.7 C) (Oral)   Wt 218 lb 3.2 oz (99 kg)   SpO2 96%   BMI 39.91 kg/m    Subjective:    Patient ID: Karen Farley, female    DOB: 1945-12-08, 75 y.o.   MRN: [id number]  HPI: Karen Farley is a 75 y.o. female  Chief Complaint  Patient presents with   Hospitalization Follow-up   Transition of [facility] Follow up.   Hospital/Facility: [facility] D/C Physician: Dr. Kurtis Bushman D/C Date: 05/17/21  Records Requested: 06/01/21 Records Received: 06/01/21 Records Reviewed: 06/01/21  Diagnoses on Discharge: Volume Overload  Date of interactive Contact within 48 hours of discharge: 05/28/21 Contact was through: phone  Date of 7 day or 14 day face-to-face visit:  06/01/21  within 7 days  Outpatient Encounter Medications as of 06/01/2021  Medication Sig   ACCU-CHEK GUIDE test strip USE TO CHECK BLOOD SUGAR DAILY   apixaban (ELIQUIS) 5 MG TABS tablet Take 1 tablet (5 mg total) by mouth 2 (two) times daily.   Elastic Bandages & Supports (MEDICAL COMPRESSION STOCKINGS) MISC by Does not apply route.   latanoprost (XALATAN) 0.005 % ophthalmic solution Place 1 drop into both eyes at bedtime.   metoprolol succinate (TOPROL-XL) 50 MG 24 hr tablet TAKE 1 TABLET(50 MG) BY MOUTH DAILY WITH OR IMMEDIATELY FOLLOWING A MEAL   Multiple Vitamin (MULTIVITAMIN WITH MINERALS) TABS tablet Take 1 tablet by mouth daily.   torsemide (DEMADEX) 20 MG tablet Take 1 tablet by mouth 3 (three) times a week.   Vitamin D, Cholecalciferol, 50 MCG (2000 UT) CAPS Take 1 tablet by mouth.   [DISCONTINUED] metolazone (ZAROXOLYN) 2.5 MG tablet Take 1 tablet (2.5 mg total) by mouth daily.   [DISCONTINUED] spironolactone (ALDACTONE) 25 MG tablet Take 1 tablet (25 mg total) by mouth daily.   No facility-administered encounter medications on file as of 06/01/2021.  Per Hospitalist: "Karen Farley is a 75 y.o. female with medical history significant for Diastolic CHF, permanent A. fib on  Eliquis,  DM, CKD 3B, morbid obesity, hospitalized 6/21-6/29 with hypotension and AKI in part related to poor tolerance of diuretic regimen who presents to the ED with a complaint of increased lower extremity edema and lower abdominal swelling, poor exercise tolerance, dyspnea on exertion.  She had minimal improvement with outpatient up titration of torsemide by her primary cardiologist Anasarca Acute on chronic diastolic CHF Left Pleural effusion - Patient with fluid retention not responding to outpatient increase in torsemide, - CXRon admisison with left pleural effusion -moderate She was started on IV diuretics.  Cardiology and nephrology were on board. She is clinically doing better and stable for discharge. Spoke to nephrology Dr. Candiss Norse who recommended holding metolazone, torsemide, and Aldactone and she will be seen in couple of days in nephrology clinic and they will reevaluate and resume it at that time  Hypokalemia- Supplemented now stable  Permanent atrial fibrillation ([place]) On chronic anticoagulation Heart rate control  Hyponatremia Sodium 129, asymptomatic Likely from diuresis Holding diuretics as above    Type 2 diabetes mellitus ([place]) Home meds acute on CKD stage 3b  Mildly worsened due to diuresis Nephrology following-Dr. Candiss Norse per our conversation recommended holding Aldactone, torsemide, and metolazone.  Will be evaluated in couple of days and resume at that time per nephrology    Obesity, Class III, BMI 40-49.9 (morbid obesity) ([place]) - Complicating factor to overall prognosis and care  Rt index finger cellulitis-mildly  red and swollen from fingersticks being check on the finger questionable cellulitis Improving with Keflex, needs to complete course  Lymphedema At home should be qid.discussed with pt"  Diagnostic Tests Reviewed:  CLINICAL DATA:  Left thoracentesis   EXAM: PORTABLE CHEST 1 VIEW   COMPARISON:  05/20/2021   FINDINGS: Chronic Cardiopericardial  enlargement. Decrease in left pleural effusion. Stable reticulation bilaterally. No visible re-expansion edema. No pneumothorax.   IMPRESSION: No complicating features of left thoracentesis.   INDICATION: Shortness of breath. Left-sided pleural effusion. Request diagnostic and therapeutic THORACENTESIS.   EXAM: ULTRASOUND GUIDED LEFT THORACENTESIS   MEDICATIONS: 1% plain lidocaine, 5 mL   COMPLICATIONS: None immediate.   PROCEDURE: An ultrasound guided thoracentesis was thoroughly discussed with the patient and questions answered. The benefits, risks, alternatives and complications were also discussed. The patient understands and wishes to proceed with the procedure. Written consent was obtained.   Ultrasound was performed to localize and mark an adequate pocket of fluid in the left chest. The area was then prepped and draped in the normal sterile fashion. 1% Lidocaine was used for local anesthesia. Under ultrasound guidance a 6 Fr Safe-T-Centesis catheter was introduced. Thoracentesis was performed. The catheter was removed and a dressing applied.   FINDINGS: A total of approximately 400 mL of clear yellow fluid was removed. Samples were sent to the laboratory as requested by the clinical team.   IMPRESSION: Successful ultrasound guided left thoracentesis yielding 400 mL of pleural fluid.   Read by: Ascencion Dike PA-C  CLINICAL DATA:  SOB, weakness, dry cough, leg swelling x "weeks". Hx of PAD, CHF, Afib.   EXAM: CHEST - 2 VIEW   COMPARISON:  05/16/2021   FINDINGS: Persistent left lower lung consolidation with possible effusion. Patchy perihilar airspace opacities slightly increased on the right.   Heart size difficult to assess due to adjacent opacities.   No pneumothorax.   Visualized bones unremarkable.   IMPRESSION: Slightly worse airspace disease, left greater than right, with possible effusion  CLINICAL DATA:  Shortness of breath and lower  extremity swelling.   EXAM: CHEST - 2 VIEW   COMPARISON:  April 10, 2021   FINDINGS: There is opacification of the mid and lower left lung. This is increased in severity when compared to the prior study. A left pleural effusion is also noted. The right lung is clear. No pneumothorax is seen. The cardiac silhouette is enlarged and unchanged in size. The visualized skeletal structures are unremarkable.   IMPRESSION: 1. Worsening left basilar atelectasis and/or infiltrate with associated left-sided pleural effusion.  Disposition: Home  Consults:  Cardiology, nephrology  Discharge Instructions: Recommendations for Outpatient Follow-up:  Follow up with PCP in 1 week Please obtain BMP/CBC in one week Please follow up with cardiology in one week F/u with CHF F/u with nephrology  Disease/illness Education: Discussed today  Home Health/Community Services Discussions/Referrals: in place  Establishment or re-establishment of referral orders for community resources: in place  Discussion with other health care providers: N/A  Assessment and Support of treatment regimen adherence: Good  Appointments Coordinated with: Patient   Education for self-management, independent living, and ADLs: Discussed today  Feeling hit or miss since getting out of the hospital. She is still feeling very tired. Having issues sleeping. Still retaining a lot of fluids in her abdomen. She feels like her legs are doing better. Has been doing the lymphatic pump 3x a day for an hour. Has not been putting out enough urine. Has not been feeling SOB like  she was. She has been following with nephrology and cardiology. She notes that she is much better than she was before she went to the hospital, but still not great.   Relevant past medical, surgical, family and social history reviewed and updated as indicated. Interim medical history since our last visit reviewed. Allergies and medications reviewed and  updated.  Review of Systems  Constitutional: Negative.   Respiratory:  Positive for shortness of breath. Negative for apnea, cough, choking, chest tightness, wheezing and stridor.   Cardiovascular:  Positive for leg swelling. Negative for chest pain and palpitations.  Gastrointestinal: Negative.   Musculoskeletal: Negative.   Neurological: Negative.   Psychiatric/Behavioral: Negative.     Per HPI unless specifically indicated above     Objective:    BP 96/67   Pulse 92   Temp 98 F (36.7 C) (Oral)   Wt 218 lb 3.2 oz (99 kg)   SpO2 96%   BMI 39.91 kg/m   Wt Readings from Last 3 Encounters:  06/01/21 218 lb 3.2 oz (99 kg)  05/27/21 218 lb 12.8 oz (99.2 kg)  04/26/21 234 lb (106.1 kg)    Physical Exam Vitals and nursing note reviewed.  Constitutional:      General: She is not in acute distress.    Appearance: Normal appearance. She is not ill-appearing, toxic-appearing or diaphoretic.  HENT:     Head: Normocephalic and atraumatic.     Right Ear: External ear normal.     Left Ear: External ear normal.     Nose: Nose normal.     Mouth/Throat:     Mouth: Mucous membranes are moist.     Pharynx: Oropharynx is clear.  Eyes:     General: No scleral icterus.       Right eye: No discharge.        Left eye: No discharge.     Extraocular Movements: Extraocular movements intact.     Conjunctiva/sclera: Conjunctivae normal.     Pupils: Pupils are equal, round, and reactive to light.  Cardiovascular:     Rate and Rhythm: Normal rate and regular rhythm.     Pulses: Normal pulses.     Heart sounds: Normal heart sounds. No murmur heard.   No friction rub. No gallop.  Pulmonary:     Effort: Pulmonary effort is normal. No respiratory distress.     Breath sounds: No stridor. Rales (fine crackles at bilateral bases) present. No wheezing or rhonchi.  Chest:     Chest wall: No tenderness.  Musculoskeletal:        General: Normal range of motion.     Cervical back: Normal range of  motion and neck supple.     Right lower leg: Edema (1+) present.     Left lower leg: Edema (1+) present.  Skin:    General: Skin is warm and dry.     Capillary Refill: Capillary refill takes less than 2 seconds.     Coloration: Skin is not jaundiced or pale.     Findings: No bruising, erythema, lesion or rash.  Neurological:     General: No focal deficit present.     Mental Status: She is alert and oriented to person, place, and time. Mental status is at baseline.  Psychiatric:        Mood and Affect: Mood normal.        Behavior: Behavior normal.        Thought Content: Thought content normal.  Judgment: Judgment normal.    Results for orders placed or performed in visit on 06/01/21  CBC with Differential/Platelet  Result Value Ref Range   WBC 7.2 3.4 - 10.8 x10E3/uL   RBC 5.14 3.77 - 5.28 x10E6/uL   Hemoglobin 13.6 11.1 - 15.9 g/dL   Hematocrit 41.8 34.0 - 46.6 %   MCV 81 79 - 97 fL   MCH 26.5 (L) 26.6 - 33.0 pg   MCHC 32.5 31.5 - 35.7 g/dL   RDW 15.3 11.7 - 15.4 %   Platelets 347 150 - 450 x10E3/uL   Neutrophils 74 Not Estab. %   Lymphs 11 Not Estab. %   Monocytes 9 Not Estab. %   Eos 5 Not Estab. %   Basos 1 Not Estab. %   Neutrophils Absolute 5.3 1.4 - 7.0 x10E3/uL   Lymphocytes Absolute 0.8 0.7 - 3.1 x10E3/uL   Monocytes Absolute 0.7 0.1 - 0.9 x10E3/uL   EOS (ABSOLUTE) 0.3 0.0 - 0.4 x10E3/uL   Basophils Absolute 0.1 0.0 - 0.2 x10E3/uL   Immature Granulocytes 0 Not Estab. %   Immature Grans (Abs) 0.0 0.0 - 0.1 H68H7/GB  Basic metabolic panel  Result Value Ref Range   Glucose 130 (H) 65 - 99 mg/dL   BUN 31 (H) 8 - 27 mg/dL   Creatinine, Ser 1.48 (H) 0.57 - 1.00 mg/dL   eGFR 37 (L) >59 mL/min/1.73   BUN/Creatinine Ratio 21 12 - 28   Sodium 136 134 - 144 mmol/L   Potassium 4.0 3.5 - 5.2 mmol/L   Chloride 91 (L) 96 - 106 mmol/L   CO2 28 20 - 29 mmol/L   Calcium 9.7 8.7 - 10.3 mg/dL      Assessment & Plan:   Problem List Items Addressed This Visit        Cardiovascular and Mediastinum   Acute on chronic diastolic CHF (congestive heart failure) (HCC) - Primary    Significantly better. Seeing CHF clinic next week. Continue diuretics. Continue to monitor.       Relevant Medications   torsemide (DEMADEX) 20 MG tablet   Other Relevant Orders   CBC with Differential/Platelet (Completed)   Basic metabolic panel (Completed)     Genitourinary   Stage 3b chronic kidney disease ([place])    Rechecking labs. Await results. Treat as needed.         Other   Hyponatremia    Rechecking labs. Await results. Treat as needed.       Other Visit Diagnoses     Pleural effusion, left       Continue diuretics. Will repeat CXR in about a month.   Hypokalemia       Rechecking labs. Await results. Treat as needed.    Cellulitis of finger of right hand       Resolved.         Follow up plan: Return in about 4 weeks (around 06/29/2021).

## 2021-06-02 LAB — CBC WITH DIFFERENTIAL/PLATELET
Basophils Absolute: 0.1 10*3/uL (ref 0.0–0.2)
Basos: 1 %
EOS (ABSOLUTE): 0.3 10*3/uL (ref 0.0–0.4)
Eos: 5 %
Hematocrit: 41.8 % (ref 34.0–46.6)
Hemoglobin: 13.6 g/dL (ref 11.1–15.9)
Immature Grans (Abs): 0 10*3/uL (ref 0.0–0.1)
Immature Granulocytes: 0 %
Lymphocytes Absolute: 0.8 10*3/uL (ref 0.7–3.1)
Lymphs: 11 %
MCH: 26.5 pg — ABNORMAL LOW (ref 26.6–33.0)
MCHC: 32.5 g/dL (ref 31.5–35.7)
MCV: 81 fL (ref 79–97)
Monocytes Absolute: 0.7 10*3/uL (ref 0.1–0.9)
Monocytes: 9 %
Neutrophils Absolute: 5.3 10*3/uL (ref 1.4–7.0)
Neutrophils: 74 %
Platelets: 347 10*3/uL (ref 150–450)
RBC: 5.14 x10E6/uL (ref 3.77–5.28)
RDW: 15.3 % (ref 11.7–15.4)
WBC: 7.2 10*3/uL (ref 3.4–10.8)

## 2021-06-02 LAB — BASIC METABOLIC PANEL
BUN/Creatinine Ratio: 21 (ref 12–28)
BUN: 31 mg/dL — ABNORMAL HIGH (ref 8–27)
CO2: 28 mmol/L (ref 20–29)
Calcium: 9.7 mg/dL (ref 8.7–10.3)
Chloride: 91 mmol/L — ABNORMAL LOW (ref 96–106)
Creatinine, Ser: 1.48 mg/dL — ABNORMAL HIGH (ref 0.57–1.00)
Glucose: 130 mg/dL — ABNORMAL HIGH (ref 65–99)
Potassium: 4 mmol/L (ref 3.5–5.2)
Sodium: 136 mmol/L (ref 134–144)
eGFR: 37 mL/min/{1.73_m2} — ABNORMAL LOW (ref >59)

## 2021-06-03 NOTE — Assessment & Plan Note (Signed)
Rechecking labs. Await results. Treat as needed.  

## 2021-06-03 NOTE — Assessment & Plan Note (Signed)
Significantly better. Seeing CHF clinic next week. Continue diuretics. Continue to monitor.

## 2021-06-04 DIAGNOSIS — E1122 Type 2 diabetes mellitus with diabetic chronic kidney disease: Secondary | ICD-10-CM | POA: Diagnosis not present

## 2021-06-04 DIAGNOSIS — I509 Heart failure, unspecified: Secondary | ICD-10-CM | POA: Diagnosis not present

## 2021-06-04 DIAGNOSIS — I890 Lymphedema, not elsewhere classified: Secondary | ICD-10-CM | POA: Diagnosis not present

## 2021-06-04 DIAGNOSIS — N189 Chronic kidney disease, unspecified: Secondary | ICD-10-CM | POA: Diagnosis not present

## 2021-06-04 DIAGNOSIS — I4891 Unspecified atrial fibrillation: Secondary | ICD-10-CM | POA: Diagnosis not present

## 2021-06-04 DIAGNOSIS — I130 Hypertensive heart and chronic kidney disease with heart failure and stage 1 through stage 4 chronic kidney disease, or unspecified chronic kidney disease: Secondary | ICD-10-CM | POA: Diagnosis not present

## 2021-06-04 DIAGNOSIS — E1151 Type 2 diabetes mellitus with diabetic peripheral angiopathy without gangrene: Secondary | ICD-10-CM | POA: Diagnosis not present

## 2021-06-04 DIAGNOSIS — I872 Venous insufficiency (chronic) (peripheral): Secondary | ICD-10-CM | POA: Diagnosis not present

## 2021-06-04 DIAGNOSIS — K5900 Constipation, unspecified: Secondary | ICD-10-CM | POA: Diagnosis not present

## 2021-06-05 ENCOUNTER — Ambulatory Visit: Payer: Medicare PPO | Admitting: Family

## 2021-06-05 DIAGNOSIS — K5900 Constipation, unspecified: Secondary | ICD-10-CM | POA: Diagnosis not present

## 2021-06-05 DIAGNOSIS — I872 Venous insufficiency (chronic) (peripheral): Secondary | ICD-10-CM | POA: Diagnosis not present

## 2021-06-05 DIAGNOSIS — I130 Hypertensive heart and chronic kidney disease with heart failure and stage 1 through stage 4 chronic kidney disease, or unspecified chronic kidney disease: Secondary | ICD-10-CM | POA: Diagnosis not present

## 2021-06-05 DIAGNOSIS — E1122 Type 2 diabetes mellitus with diabetic chronic kidney disease: Secondary | ICD-10-CM | POA: Diagnosis not present

## 2021-06-05 DIAGNOSIS — N189 Chronic kidney disease, unspecified: Secondary | ICD-10-CM | POA: Diagnosis not present

## 2021-06-05 DIAGNOSIS — I509 Heart failure, unspecified: Secondary | ICD-10-CM | POA: Diagnosis not present

## 2021-06-05 DIAGNOSIS — I890 Lymphedema, not elsewhere classified: Secondary | ICD-10-CM | POA: Diagnosis not present

## 2021-06-05 DIAGNOSIS — I4891 Unspecified atrial fibrillation: Secondary | ICD-10-CM | POA: Diagnosis not present

## 2021-06-05 DIAGNOSIS — E1151 Type 2 diabetes mellitus with diabetic peripheral angiopathy without gangrene: Secondary | ICD-10-CM | POA: Diagnosis not present

## 2021-06-09 DIAGNOSIS — I509 Heart failure, unspecified: Secondary | ICD-10-CM | POA: Diagnosis not present

## 2021-06-09 DIAGNOSIS — I4891 Unspecified atrial fibrillation: Secondary | ICD-10-CM | POA: Diagnosis not present

## 2021-06-09 DIAGNOSIS — N189 Chronic kidney disease, unspecified: Secondary | ICD-10-CM | POA: Diagnosis not present

## 2021-06-09 DIAGNOSIS — I872 Venous insufficiency (chronic) (peripheral): Secondary | ICD-10-CM | POA: Diagnosis not present

## 2021-06-09 DIAGNOSIS — E1122 Type 2 diabetes mellitus with diabetic chronic kidney disease: Secondary | ICD-10-CM | POA: Diagnosis not present

## 2021-06-09 DIAGNOSIS — I130 Hypertensive heart and chronic kidney disease with heart failure and stage 1 through stage 4 chronic kidney disease, or unspecified chronic kidney disease: Secondary | ICD-10-CM | POA: Diagnosis not present

## 2021-06-09 DIAGNOSIS — I890 Lymphedema, not elsewhere classified: Secondary | ICD-10-CM | POA: Diagnosis not present

## 2021-06-09 DIAGNOSIS — K5900 Constipation, unspecified: Secondary | ICD-10-CM | POA: Diagnosis not present

## 2021-06-09 DIAGNOSIS — E1151 Type 2 diabetes mellitus with diabetic peripheral angiopathy without gangrene: Secondary | ICD-10-CM | POA: Diagnosis not present

## 2021-06-12 NOTE — Progress Notes (Deleted)
   Patient ID: Karen Farley, female    DOB: 10/29/1945, 75 y.o.   MRN: [id number]  HPI  Ms Skousen is a 75 y/o female with a history of  Echo report from 05/17/21 reviewed and showed an EF of 60-65% along with mild/ moderate LAE.   Admitted 05/17/21 due to   Review of Systems    Physical Exam  Assessment & Plan:  1: Chronic heart failure with preserved ejection fraction with structural changes (LAE)- - NYHA class

## 2021-06-13 ENCOUNTER — Ambulatory Visit: Payer: Medicare PPO | Admitting: Family

## 2021-06-18 ENCOUNTER — Encounter: Payer: Self-pay | Admitting: Cardiology

## 2021-06-18 ENCOUNTER — Ambulatory Visit: Payer: Medicare PPO | Admitting: Cardiology

## 2021-06-18 ENCOUNTER — Other Ambulatory Visit: Payer: Self-pay

## 2021-06-18 VITALS — BP 98/78 | HR 82 | Ht 62.0 in | Wt 206.0 lb

## 2021-06-18 DIAGNOSIS — I4821 Permanent atrial fibrillation: Secondary | ICD-10-CM

## 2021-06-18 DIAGNOSIS — I5033 Acute on chronic diastolic (congestive) heart failure: Secondary | ICD-10-CM

## 2021-06-18 NOTE — Patient Instructions (Signed)
Medication Instructions:  Your physician recommends that you continue on your current medications as directed. Please refer to the Current Medication list given to you today.  *If you need a refill on your cardiac medications before your next appointment, please call your pharmacy*   Lab Work: None ordered If you have labs (blood work) drawn today and your tests are completely normal, you will receive your results only by: MyChart Message (if you have MyChart) OR A paper copy in the mail If you have any lab test that is abnormal or we need to change your treatment, we will call you to review the results.   Testing/Procedures: None ordered   Follow-Up: At [facility], you and your health needs are our priority.  As part of our continuing mission to provide you with exceptional heart care, we have created designated Provider Care Teams.  These Care Teams include your primary Cardiologist (physician) and Advanced Practice Providers (APPs -  Physician Assistants and Nurse Practitioners) who all work together to provide you with the care you need, when you need it.  We recommend signing up for the patient portal called "MyChart".  Sign up information is provided on this After Visit Summary.  MyChart is used to connect with patients for Virtual Visits (Telemedicine).  Patients are able to view lab/test results, encounter notes, upcoming appointments, etc.  Non-urgent messages can be sent to your provider as well.   To learn more about what you can do with MyChart, go to [email or web address].    Your next appointment:   6 month(s)  The format for your next appointment:   In Person  Provider:   You may see Brian Agbor-Etang, MD or one of the following Advanced Practice Providers on your designated Care Team:   Christopher Berge, NP Ryan Dunn, PA-C Jacquelyn Visser, PA-C Cadence Furth, PA-C   Other Instructions   

## 2021-06-18 NOTE — Progress Notes (Signed)
Cardiology Office Note:    Date:  06/18/2021   ID:  Karen Farley, DOB [birth year], MRN [id number]  PCP:  Valerie Roys, DO  Cardiologist:  Kate Sable, MD  Electrophysiologist:  None   Referring MD: Valerie Roys, DO   Chief Complaint  Patient presents with   Other    6-8 wk f/u c/o low BP and fatigue. Meds reviewed verbally with pt.    History of Present Illness:    Karen Farley is a 75 y.o. female with a hx of CKD stage III, HFpEF, hypertension, permanent atrial fibrillation, hyperlipidemia, who presents for follow-up.    Recently seen in the hospital due to HFpEF/volume overload admission.  She was diuresed with torsemide and metolazone.  Discharged on torsemide.  Her weight has been relatively stable around 210 pounds.  Takes Eliquis without any bleeding issues.  Denies palpitations.  States her blood pressures have been running low normal.   Prior notes Patient saw PCP on November 16, 2019 and EKG noted to be in atrial fibrillation.  She was started on Toprol-XL and Xarelto. She states having a long history of lower extremity swelling for years.  Marland Kitchen  TTEcho 11/2019 showed normal systolic function with ejection fraction of EF 60 to 65%, moderately dilated LA, grade 2 diastolic dysfunction.    Past Medical History:  Diagnosis Date   Chronic heart failure with preserved ejection fraction (HFpEF) ([place])    a. 11/2019 Echo: EF 60-65%, no rwma, Nl RV size/fxn. Mod dil LA.   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)    DDD (degenerative disc disease), lumbar    Degenerative disc disease, lumbar    bulging and dengerated   Diabetes mellitus without complication (HCC)    Hyperlipidemia    Hypertension    Lymphedema    Morbid obesity ([place])    Osteoarthritis of both knees    Osteopenia     Past Surgical History:  Procedure Laterality Date   CHOLECYSTECTOMY     DILATION AND CURETTAGE OF UTERUS     TEAR DUCT PROBING WITH STRABISMUS REPAIR Right     TONSILLECTOMY      Current Medications: Current Meds  Medication Sig   ACCU-CHEK GUIDE test strip USE TO CHECK BLOOD SUGAR DAILY   apixaban (ELIQUIS) 5 MG TABS tablet Take 1 tablet (5 mg total) by mouth 2 (two) times daily.   Elastic Bandages & Supports (MEDICAL COMPRESSION STOCKINGS) MISC by Does not apply route.   latanoprost (XALATAN) 0.005 % ophthalmic solution Place 1 drop into both eyes at bedtime.   metoprolol succinate (TOPROL-XL) 50 MG 24 hr tablet TAKE 1 TABLET(50 MG) BY MOUTH DAILY WITH OR IMMEDIATELY FOLLOWING A MEAL   Multiple Vitamin (MULTIVITAMIN WITH MINERALS) TABS tablet Take 1 tablet by mouth daily.   torsemide (DEMADEX) 20 MG tablet Take 1 tablet by mouth 3 (three) times a week.   Vitamin D, Cholecalciferol, 50 MCG (2000 UT) CAPS Take 1 tablet by mouth.     Allergies:   Patient has no known allergies.   Social History   Socioeconomic History   Marital status: Married    Spouse name: Not on file   Number of children: Not on file   Years of education: Not on file   Highest education level: Not on file  Occupational History   Not on file  Tobacco Use   Smoking status: Never   Smokeless tobacco: Never  Vaping Use   Vaping Use: Never  used  Substance and Sexual Activity   Alcohol use: Not Currently    Alcohol/week: 0.0 standard drinks    Comment: on rare occasion   Drug use: No   Sexual activity: Not Currently  Other Topics Concern   Not on file  Social History Narrative   Not on file   Social Determinants of Health   Financial Resource Strain: Not on file  Food Insecurity: Not on file  Transportation Needs: Not on file  Physical Activity: Not on file  Stress: Not on file  Social Connections: Not on file     Family History: The patient's family history includes Breast cancer in her maternal grandmother and mother; Cancer in her brother and brother; Heart disease in her brother and mother; Heart disease (age of onset: 70) in her father; Osteoporosis  in her mother; Parkinson's disease in her brother.  ROS:   Please see the history of present illness.     All other systems reviewed and are negative.  EKGs/Labs/Other Studies Reviewed:    The following studies were reviewed today:   EKG:  EKG is  ordered today.  The ekg ordered today demonstrates atrial fibrillation, heart rate 86  Recent Labs: 04/10/2021: TSH 3.091 04/25/2021: ALT 17 05/16/2021: B Natriuretic Peptide 122.6 05/26/2021: Magnesium 2.0 06/01/2021: BUN 31; Creatinine, Ser 1.48; Hemoglobin 13.6; Platelets 347; Potassium 4.0; Sodium 136  Recent Lipid Panel    Component Value Date/Time   CHOL 210 (H) 01/15/2021 1319   CHOL 211 (H) 06/28/2016 1317   TRIG 120 01/15/2021 1319   TRIG 204 (H) 06/28/2016 1317   HDL 49 01/15/2021 1319   VLDL 41 (H) 06/28/2016 1317   LDLCALC 139 (H) 01/15/2021 1319    Physical Exam:    VS:  BP 98/78 (BP Location: Right Arm, Patient Position: Sitting, Cuff Size: Large)   Pulse 82   Ht 5\' 2"  (1.575 m)   Wt 206 lb (93.4 kg)   SpO2 95%   BMI 37.68 kg/m     Wt Readings from Last 3 Encounters:  06/18/21 206 lb (93.4 kg)  06/01/21 218 lb 3.2 oz (99 kg)  05/27/21 218 lb 12.8 oz (99.2 kg)     GEN:  Well nourished, well developed in no acute distress HEENT: Normal NECK: No JVD; No carotid bruits LYMPHATICS: No lymphadenopathy CARDIAC: Irregular irregular ABDOMEN: Soft, non-tender, non-distended MUSCULOSKELETAL: 1+ edema; No deformity  SKIN: Warm and dry NEUROLOGIC:  Alert and oriented x 3 PSYCHIATRIC:  Normal affect   ASSESSMENT:    1. Permanent atrial fibrillation ([place])   2. Acute on chronic heart failure with preserved ejection fraction ([place])   3. Morbid obesity ([place])     PLAN:    Permanent atrial fibrillation.  CHA2DS2-VASc of 4.  Currently in sinus rhythm.  Continue Toprol-XL to 50 mg daily, Eliquis. HFpEF, grade 2 diastolic function.  Continue torsemide 20 mg every other day.  Take additional dose of torsemide for weight  gain.  Patient is morbidly obese, weight loss advised  Follow-up 6 months   This note was generated in part or whole with voice recognition software. Voice recognition is usually quite accurate but there are transcription errors that can and very often do occur. I apologize for any typographical errors that were not detected and corrected.  Medication Adjustments/Labs and Tests Ordered: Current medicines are reviewed at length with the patient today.  Concerns regarding medicines are outlined above.  Orders Placed This Encounter  Procedures   EKG 12-Lead  No orders of the defined types were placed in this encounter.   Patient Instructions  Medication Instructions:  Your physician recommends that you continue on your current medications as directed. Please refer to the Current Medication list given to you today.  *If you need a refill on your cardiac medications before your next appointment, please call your pharmacy*   Lab Work: None ordered If you have labs (blood work) drawn today and your tests are completely normal, you will receive your results only by: [place] (if you have MyChart) OR A paper copy in the mail If you have any lab test that is abnormal or we need to change your treatment, we will call you to review the results.   Testing/Procedures: None ordered   Follow-Up: At [facility], you and your health needs are our priority.  As part of our continuing mission to provide you with exceptional heart care, we have created designated Provider Care Teams.  These Care Teams include your primary Cardiologist (physician) and Advanced Practice Providers (APPs -  Physician Assistants and Nurse Practitioners) who all work together to provide you with the care you need, when you need it.  We recommend signing up for the patient portal called "MyChart".  Sign up information is provided on this After Visit Summary.  MyChart is used to connect with patients for  Virtual Visits (Telemedicine).  Patients are able to view lab/test results, encounter notes, upcoming appointments, etc.  Non-urgent messages can be sent to your provider as well.   To learn more about what you can do with MyChart, go to [email or web address].    Your next appointment:   6 month(s)  The format for your next appointment:   In Person  Provider:   You may see Kate Sable, MD or one of the following Advanced Practice Providers on your designated Care Team:   Murray Hodgkins, NP Christell Faith, PA-C Marrianne Mood, PA-C Cadence Bedford, [place]   Other Instructions    Signed, Kate Sable, MD  06/18/2021 5:16 PM    [place]

## 2021-06-29 ENCOUNTER — Telehealth: Payer: Self-pay

## 2021-06-29 NOTE — Telephone Encounter (Signed)
I have not ordered anything- what is this for?

## 2021-06-29 NOTE — Telephone Encounter (Signed)
Copied from [place] [id number]. Topic: General - Other >> Jun 29, 2021  8:22 AM Valere Dross wrote: Reason for CRM: Rocil from [facility] wanted to request a per to per to the provider since the last order was rejected. Rocil stated they must have a call back by today 09/09 by 3pm. She also stated that this appt would need to be Monday 09/12 at 12pm. [phone] option:1   Routing to provider for Peer to Peer.

## 2021-07-02 ENCOUNTER — Ambulatory Visit: Payer: Medicare PPO | Admitting: Cardiology

## 2021-07-07 ENCOUNTER — Ambulatory Visit: Payer: Medicare PPO

## 2021-07-09 ENCOUNTER — Telehealth: Payer: Self-pay | Admitting: Family Medicine

## 2021-07-09 NOTE — Telephone Encounter (Signed)
LVM for pt to rtn my call to schedule AWV with [facility]. Please schedule AWV if pt calls the office  

## 2021-07-13 ENCOUNTER — Ambulatory Visit: Payer: Medicare PPO | Admitting: Family Medicine

## 2021-07-13 ENCOUNTER — Encounter: Payer: Self-pay | Admitting: Family Medicine

## 2021-07-13 ENCOUNTER — Other Ambulatory Visit: Payer: Self-pay

## 2021-07-13 VITALS — BP 95/65 | HR 75 | Temp 97.5°F | Ht 62.0 in | Wt 215.6 lb

## 2021-07-13 DIAGNOSIS — I129 Hypertensive chronic kidney disease with stage 1 through stage 4 chronic kidney disease, or unspecified chronic kidney disease: Secondary | ICD-10-CM

## 2021-07-13 DIAGNOSIS — R5382 Chronic fatigue, unspecified: Secondary | ICD-10-CM

## 2021-07-13 DIAGNOSIS — Z23 Encounter for immunization: Secondary | ICD-10-CM

## 2021-07-13 DIAGNOSIS — J90 Pleural effusion, not elsewhere classified: Secondary | ICD-10-CM | POA: Diagnosis not present

## 2021-07-13 DIAGNOSIS — H6192 Disorder of left external ear, unspecified: Secondary | ICD-10-CM | POA: Diagnosis not present

## 2021-07-13 DIAGNOSIS — Z0000 Encounter for general adult medical examination without abnormal findings: Secondary | ICD-10-CM | POA: Diagnosis not present

## 2021-07-13 DIAGNOSIS — N1832 Chronic kidney disease, stage 3b: Secondary | ICD-10-CM

## 2021-07-13 DIAGNOSIS — E1122 Type 2 diabetes mellitus with diabetic chronic kidney disease: Secondary | ICD-10-CM

## 2021-07-13 LAB — BAYER DCA HB A1C WAIVED: HB A1C (BAYER DCA - WAIVED): 6.9 % — ABNORMAL HIGH (ref 4.8–5.6)

## 2021-07-13 MED ORDER — LINAGLIPTIN 5 MG PO TABS: 5.0000 mg | ORAL_TABLET | Freq: Every day | ORAL | 3 refills | Status: DC

## 2021-07-13 NOTE — Progress Notes (Signed)
BP 95/65   Pulse 75   Temp (!) 97.5 F (36.4 C) (Oral)   Ht _0  (1.575 m)   Wt 215 lb 9.6 oz (97.8 kg)   SpO2 98%   BMI 39.43 kg/m    Subjective:    Patient ID: Karen Farley, female    DOB: 08/13/1946, 75 y.o.   MRN: [id number]  HPI: Karen Farley is a 75 y.o. female presenting on 07/13/2021 for Annual wellness exam. Current medical complaints include:  Saw cardiology about a month ago. Things have been stable. They were pleased with how she was doing.   FATIGUE Duration:  chronic Severity: severe  Onset: gradual Symptoms improve with rest: no  Depressive symptoms: no Stress/anxiety: yes Insomnia: no  Snoring: no Observed apnea by bed partner: no Daytime hypersomnolence:yes Wakes feeling refreshed: no History of sleep study: no Dysnea on exertion:  yes Orthopnea/PND: no Chest pain: no Chronic cough: no Lower extremity edema: yes Arthralgias:yes Myalgias: yes Weakness: no Rash: no  DIABETES Hypoglycemic episodes:no Polydipsia/polyuria: no Visual disturbance: no Chest pain: no Paresthesias: no Glucose Monitoring: yes  Accucheck frequency: Daily Taking Insulin?: no Blood Pressure Monitoring: not checking Retinal Examination: Not up to Date Foot Exam: Up to Date Diabetic Education: Completed Pneumovax: Up to Date Influenza: Up to Date Aspirin: yes  Functional Status Survey: Is the patient deaf or have difficulty hearing?: No Does the patient have difficulty seeing, even when wearing glasses/contacts?: No Does the patient have difficulty concentrating, remembering, or making decisions?: No Does the patient have difficulty walking or climbing stairs?: No Does the patient have difficulty dressing or bathing?: No Does the patient have difficulty doing errands alone such as visiting a doctor's office or shopping?: Yes  Fall Risk  07/13/2021 06/01/2021 05/22/2020 04/27/2019 07/10/2018  Falls in the past year? 0 0 1 0 No  Comment - - lost balance - -   Number falls in past yr: 0 0 0 0 -  Injury with Fall? 0 1 1 0 -  Comment - - broke right wrist - -  Risk for fall due to : No Fall Risks Impaired balance/gait History of fall(s);Impaired balance/gait - -  Follow up Falls evaluation completed Falls evaluation completed Falls evaluation completed;Education provided;Falls prevention discussed - -    Depression Screen Depression screen [facility] 2/9 07/13/2021 06/01/2021 05/22/2020 04/27/2019 07/10/2018  Decreased Interest 0 0 0 0 0  Down, Depressed, Hopeless 0 0 0 0 0  PHQ - 2 Score 0 0 0 0 0  Altered sleeping - - 0 0 -  Tired, decreased energy - - 0 0 -  Change in appetite - - 0 0 -  Feeling bad or failure about yourself  - - 0 0 -  Trouble concentrating - - 0 0 -  Moving slowly or fidgety/restless - - 0 0 -  Suicidal thoughts - - 0 0 -  PHQ-9 Score - - 0 0 -  Difficult doing work/chores - - Not difficult at all Not difficult at all -   Advanced Directives Does patient have a HCPOA?    yes Does patient have a living will or MOST form?  yes  Past Medical History:  Past Medical History:  Diagnosis Date   Chronic heart failure with preserved ejection fraction (HFpEF) ([place])    a. 11/2019 Echo: EF 60-65%, no rwma, Nl RV size/fxn. Mod dil LA.   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)    DDD (degenerative disc disease),  lumbar    Degenerative disc disease, lumbar    bulging and dengerated   Diabetes mellitus without complication (HCC)    Hyperlipidemia    Hypertension    Lymphedema    Morbid obesity ([place])    Osteoarthritis of both knees    Osteopenia     Surgical History:  Past Surgical History:  Procedure Laterality Date   CHOLECYSTECTOMY     DILATION AND CURETTAGE OF UTERUS     TEAR DUCT PROBING WITH STRABISMUS REPAIR Right    TONSILLECTOMY      Medications:  Current Outpatient Medications on File Prior to Visit  Medication Sig   ACCU-CHEK GUIDE test strip USE TO CHECK BLOOD SUGAR DAILY   apixaban (ELIQUIS) 5 MG TABS  tablet Take 1 tablet (5 mg total) by mouth 2 (two) times daily.   Elastic Bandages & Supports (MEDICAL COMPRESSION STOCKINGS) MISC by Does not apply route.   latanoprost (XALATAN) 0.005 % ophthalmic solution Place 1 drop into both eyes at bedtime.   metoprolol succinate (TOPROL-XL) 50 MG 24 hr tablet TAKE 1 TABLET(50 MG) BY MOUTH DAILY WITH OR IMMEDIATELY FOLLOWING A MEAL   Multiple Vitamin (MULTIVITAMIN WITH MINERALS) TABS tablet Take 1 tablet by mouth daily.   torsemide (DEMADEX) 20 MG tablet Take 20 mg by mouth daily.   Vitamin D, Cholecalciferol, 50 MCG (2000 UT) CAPS Take 1 tablet by mouth.   [DISCONTINUED] metolazone (ZAROXOLYN) 2.5 MG tablet Take 1 tablet (2.5 mg total) by mouth daily.   [DISCONTINUED] spironolactone (ALDACTONE) 25 MG tablet Take 1 tablet (25 mg total) by mouth daily.   No current facility-administered medications on file prior to visit.    Allergies:  No Known Allergies  Social History:  Social History   Socioeconomic History   Marital status: Married    Spouse name: Not on file   Number of children: Not on file   Years of education: Not on file   Highest education level: Not on file  Occupational History   Not on file  Tobacco Use   Smoking status: Never   Smokeless tobacco: Never  Vaping Use   Vaping Use: Never used  Substance and Sexual Activity   Alcohol use: Not Currently    Alcohol/week: 0.0 standard drinks    Comment: on rare occasion   Drug use: No   Sexual activity: Not Currently  Other Topics Concern   Not on file  Social History Narrative   Not on file   Social Determinants of Health   Financial Resource Strain: Not on file  Food Insecurity: Not on file  Transportation Needs: Not on file  Physical Activity: Not on file  Stress: Not on file  Social Connections: Not on file  Intimate Partner Violence: Not on file   Social History   Tobacco Use  Smoking Status Never  Smokeless Tobacco Never   Social History   Substance and  Sexual Activity  Alcohol Use Not Currently   Alcohol/week: 0.0 standard drinks   Comment: on rare occasion    Family History:  Family History  Problem Relation Age of Onset   Heart disease Mother        CHF   Osteoporosis Mother    Breast cancer Mother    Heart disease Father 23   Cancer Brother        Colon CA- 2002- Youngest brother   Parkinson's disease Brother        Younger Brother   Heart disease Brother  2 brothers   Cancer Brother    Breast cancer Maternal Grandmother     Past medical history, surgical history, medications, allergies, family history and social history reviewed with patient today and changes made to appropriate areas of the chart.   Review of Systems  Constitutional: Negative.   HENT: Negative.    Respiratory: Negative.    Cardiovascular:  Positive for leg swelling. Negative for chest pain, palpitations, orthopnea, claudication and PND.  Musculoskeletal: Negative.   Skin: Negative.   Neurological: Negative.   Psychiatric/Behavioral: Negative.     All other ROS negative except what is listed above and in the HPI.      Objective:    BP 95/65   Pulse 75   Temp (!) 97.5 F (36.4 C) (Oral)   Ht _0  (1.575 m)   Wt 215 lb 9.6 oz (97.8 kg)   SpO2 98%   BMI 39.43 kg/m   Wt Readings from Last 3 Encounters:  07/13/21 215 lb 9.6 oz (97.8 kg)  06/18/21 206 lb (93.4 kg)  06/01/21 218 lb 3.2 oz (99 kg)    Physical Exam Vitals and nursing note reviewed.  Constitutional:      General: She is not in acute distress.    Appearance: Normal appearance. She is obese. She is not ill-appearing, toxic-appearing or diaphoretic.  HENT:     Head: Normocephalic and atraumatic.     Right Ear: External ear normal.     Left Ear: External ear normal.     Nose: Nose normal.     Mouth/Throat:     Mouth: Mucous membranes are moist.     Pharynx: Oropharynx is clear.  Eyes:     General: No scleral icterus.       Right eye: No discharge.        Left eye:  No discharge.     Extraocular Movements: Extraocular movements intact.     Conjunctiva/sclera: Conjunctivae normal.     Pupils: Pupils are equal, round, and reactive to light.  Cardiovascular:     Rate and Rhythm: Normal rate and regular rhythm.     Pulses: Normal pulses.     Heart sounds: Normal heart sounds. No murmur heard.   No friction rub. No gallop.  Pulmonary:     Effort: Pulmonary effort is normal. No respiratory distress.     Breath sounds: Normal breath sounds. No stridor. No wheezing, rhonchi or rales.  Chest:     Chest wall: No tenderness.  Abdominal:     General: Bowel sounds are normal. There is distension.     Palpations: Abdomen is soft.  Musculoskeletal:        General: Normal range of motion.     Cervical back: Normal range of motion and neck supple.  Skin:    General: Skin is warm and dry.     Capillary Refill: Capillary refill takes less than 2 seconds.     Coloration: Skin is not jaundiced or pale.     Findings: No bruising, erythema, lesion or rash.     Comments: Scaley lesion on L ear- concern for squamous cell  Neurological:     General: No focal deficit present.     Mental Status: She is alert and oriented to person, place, and time. Mental status is at baseline.  Psychiatric:        Mood and Affect: Mood normal.        Behavior: Behavior normal.        Thought Content:  Thought content normal.        Judgment: Judgment normal.    6CIT Screen 07/13/2021 05/22/2020 04/27/2019 01/22/2018 12/24/2016  What Year? 0 points 0 points 0 points 0 points 0 points  What month? 0 points 0 points 0 points 0 points 0 points  What time? 0 points 0 points 0 points 0 points 0 points  Count back from 20 0 points 0 points 0 points 0 points 0 points  Months in reverse 0 points 0 points 0 points 0 points 2 points  Repeat phrase 2 points 0 points 0 points 0 points 0 points  Total Score 2 0 0 0 2    Results for orders placed or performed in visit on 07/13/21  CBC with  Differential/Platelet  Result Value Ref Range   WBC 6.1 3.4 - 10.8 x10E3/uL   RBC 5.71 (H) 3.77 - 5.28 x10E6/uL   Hemoglobin 13.8 11.1 - 15.9 g/dL   Hematocrit 44.2 34.0 - 46.6 %   MCV 77 (L) 79 - 97 fL   MCH 24.2 (L) 26.6 - 33.0 pg   MCHC 31.2 (L) 31.5 - 35.7 g/dL   RDW 16.0 (H) 11.7 - 15.4 %   Platelets 361 150 - 450 x10E3/uL   Neutrophils 75 Not Estab. %   Lymphs 9 Not Estab. %   Monocytes 9 Not Estab. %   Eos 6 Not Estab. %   Basos 1 Not Estab. %   Neutrophils Absolute 4.6 1.4 - 7.0 x10E3/uL   Lymphocytes Absolute 0.5 (L) 0.7 - 3.1 x10E3/uL   Monocytes Absolute 0.6 0.1 - 0.9 x10E3/uL   EOS (ABSOLUTE) 0.4 0.0 - 0.4 x10E3/uL   Basophils Absolute 0.1 0.0 - 0.2 x10E3/uL   Immature Granulocytes 0 Not Estab. %   Immature Grans (Abs) 0.0 0.0 - 0.1 x10E3/uL  Comprehensive metabolic panel  Result Value Ref Range   Glucose 180 (H) 65 - 99 mg/dL   BUN 60 (H) 8 - 27 mg/dL   Creatinine, Ser 1.66 (H) 0.57 - 1.00 mg/dL   eGFR 32 (L) >59 mL/min/1.73   BUN/Creatinine Ratio 36 (H) 12 - 28   Sodium 136 134 - 144 mmol/L   Potassium 3.1 (L) 3.5 - 5.2 mmol/L   Chloride 85 (L) 96 - 106 mmol/L   CO2 29 20 - 29 mmol/L   Calcium 9.7 8.7 - 10.3 mg/dL   Total Protein 5.8 (L) 6.0 - 8.5 g/dL   Albumin 3.8 3.7 - 4.7 g/dL   Globulin, Total 2.0 1.5 - 4.5 g/dL   Albumin/Globulin Ratio 1.9 1.2 - 2.2   Bilirubin Total 1.6 (H) 0.0 - 1.2 mg/dL   Alkaline Phosphatase 84 44 - 121 IU/L   AST 23 0 - 40 IU/L   ALT 14 0 - 32 IU/L  Bayer DCA Hb A1c Waived  Result Value Ref Range   HB A1C (BAYER DCA - WAIVED) 6.9 (H) 4.8 - 5.6 %  Lipid Panel w/o Chol/HDL Ratio  Result Value Ref Range   Cholesterol, Total 140 100 - 199 mg/dL   Triglycerides 84 0 - 149 mg/dL   HDL 33 (L) >39 mg/dL   VLDL Cholesterol Cal 16 5 - 40 mg/dL   LDL Chol Calc (NIH) 91 0 - 99 mg/dL      Assessment & Plan:   Problem List Items Addressed This Visit       Respiratory   Pleural effusion    Will repeat CXR. Await results. Treat  as needed.  Relevant Orders   DG Chest 2 View     Endocrine   Type 2 diabetes mellitus ([place])    Going up slightly with A1c of 6.9 and she is feeling worse- will start tradjenta due to kidney function and recheck 3 months.       Relevant Medications   linagliptin (TRADJENTA) 5 MG TABS tablet   Other Relevant Orders   Bayer DCA Hb A1c Waived (Completed)   Lipid Panel w/o Chol/HDL Ratio (Completed)     Genitourinary   Benign hypertensive renal disease    Still running low but doing OK. Continue current regimen. Continue to monitor. Call with any concerns.       Relevant Orders   Microalbumin, Urine Waived   Comprehensive metabolic panel (Completed)   Other Visit Diagnoses     Encounter for annual wellness exam in Medicare patient    -  Primary   Preventative care discussed today as below.    Chronic fatigue       Checking labs today. Await results. Treat as needed.    Relevant Orders   CBC with Differential/Platelet (Completed)   Comprehensive metabolic panel (Completed)   Skin lesion of left ear       Will try to get her into dermatology ASAP. Concern for squamous cell   Relevant Orders   Ambulatory referral to Dermatology   Need for influenza vaccination       Relevant Orders   Flu Vaccine QUAD High Dose(Fluad) (Completed)       Preventative Services:  Health Risk Assessment and Personalized Prevention Plan: Done today Bone Mass Measurements: Up to date Breast Cancer Screening: up to date CVD Screening: up to date Cervical Cancer Screening: N/A Colon Cancer Screening: Declined Depression Screening: Done today Diabetes Screening: Up to date Glaucoma Screening: See your eye doctor Hepatitis B vaccine: N/A Hepatitis C screening: up to date HIV Screening: up to date Flu Vaccine:Done today Lung cancer Screening: N/A Obesity Screening: Done today Pneumonia Vaccines (2): up to date STI Screening: N/A  Follow up plan: Return 2-3 weeks folloe  up.   LABORATORY TESTING:  - Pap smear: not applicable  IMMUNIZATIONS:   - Tdap: Tetanus vaccination status reviewed: Declined. - Influenza: Administered today - Pneumovax: Up to date - Prevnar: Up to date - COVID vaccine: Up to date  SCREENING: -Mammogram: Up to date  - Colonoscopy: N/A  - Bone Density: Up to date    PATIENT COUNSELING:   Advised to take 1 mg of folate supplement per day if capable of pregnancy.   Sexuality: Discussed sexually transmitted diseases, partner selection, use of condoms, avoidance of unintended pregnancy  and contraceptive alternatives.   Advised to avoid cigarette smoking.  I discussed with the patient that most people either abstain from alcohol or drink within safe limits (<=14/week and <=4 drinks/occasion for males, <=7/weeks and <= 3 drinks/occasion for females) and that the risk for alcohol disorders and other health effects rises proportionally with the number of drinks per week and how often a drinker exceeds daily limits.  Discussed cessation/primary prevention of drug use and availability of treatment for abuse.   Diet: Encouraged to adjust caloric intake to maintain  or achieve ideal body weight, to reduce intake of dietary saturated fat and total fat, to limit sodium intake by avoiding high sodium foods and not adding table salt, and to maintain adequate dietary potassium and calcium preferably from fresh fruits, vegetables, and low-fat dairy products.    stressed the importance  of regular exercise  Injury prevention: Discussed safety belts, safety helmets, smoke detector, smoking near bedding or upholstery.   Dental health: Discussed importance of regular tooth brushing, flossing, and dental visits.    NEXT PREVENTATIVE PHYSICAL DUE IN 1 YEAR. Return 2-3 weeks folloe up.

## 2021-07-13 NOTE — Patient Instructions (Signed)
Preventative Services:  Health Risk Assessment and Personalized Prevention Plan: Done today Bone Mass Measurements: Up to date Breast Cancer Screening: up to date CVD Screening: up to date Cervical Cancer Screening: N/A Colon Cancer Screening: Declined Depression Screening: Done today Diabetes Screening: Up to date Glaucoma Screening: See your eye doctor Hepatitis B vaccine: N/A Hepatitis C screening: up to date HIV Screening: up to date Flu Vaccine:Done today Lung cancer Screening: N/A Obesity Screening: Done today Pneumonia Vaccines (2): up to date STI Screening: N/A

## 2021-07-14 ENCOUNTER — Telehealth: Payer: Self-pay

## 2021-07-14 LAB — LIPID PANEL W/O CHOL/HDL RATIO
Cholesterol, Total: 140 mg/dL (ref 100–199)
HDL: 33 mg/dL — ABNORMAL LOW (ref >39)
LDL Chol Calc (NIH): 91 mg/dL (ref 0–99)
Triglycerides: 84 mg/dL (ref 0–149)
VLDL Cholesterol Cal: 16 mg/dL (ref 5–40)

## 2021-07-14 LAB — COMPREHENSIVE METABOLIC PANEL
ALT: 14 IU/L (ref 0–32)
AST: 23 IU/L (ref 0–40)
Albumin/Globulin Ratio: 1.9 (ref 1.2–2.2)
Albumin: 3.8 g/dL (ref 3.7–4.7)
Alkaline Phosphatase: 84 IU/L (ref 44–121)
BUN/Creatinine Ratio: 36 — ABNORMAL HIGH (ref 12–28)
BUN: 60 mg/dL — ABNORMAL HIGH (ref 8–27)
Bilirubin Total: 1.6 mg/dL — ABNORMAL HIGH (ref 0.0–1.2)
CO2: 29 mmol/L (ref 20–29)
Calcium: 9.7 mg/dL (ref 8.7–10.3)
Chloride: 85 mmol/L — ABNORMAL LOW (ref 96–106)
Creatinine, Ser: 1.66 mg/dL — ABNORMAL HIGH (ref 0.57–1.00)
Globulin, Total: 2 g/dL (ref 1.5–4.5)
Glucose: 180 mg/dL — ABNORMAL HIGH (ref 65–99)
Potassium: 3.1 mmol/L — ABNORMAL LOW (ref 3.5–5.2)
Sodium: 136 mmol/L (ref 134–144)
Total Protein: 5.8 g/dL — ABNORMAL LOW (ref 6.0–8.5)
eGFR: 32 mL/min/{1.73_m2} — ABNORMAL LOW (ref >59)

## 2021-07-14 LAB — CBC WITH DIFFERENTIAL/PLATELET
Basophils Absolute: 0.1 10*3/uL (ref 0.0–0.2)
Basos: 1 %
EOS (ABSOLUTE): 0.4 10*3/uL (ref 0.0–0.4)
Eos: 6 %
Hematocrit: 44.2 % (ref 34.0–46.6)
Hemoglobin: 13.8 g/dL (ref 11.1–15.9)
Immature Grans (Abs): 0 10*3/uL (ref 0.0–0.1)
Immature Granulocytes: 0 %
Lymphocytes Absolute: 0.5 10*3/uL — ABNORMAL LOW (ref 0.7–3.1)
Lymphs: 9 %
MCH: 24.2 pg — ABNORMAL LOW (ref 26.6–33.0)
MCHC: 31.2 g/dL — ABNORMAL LOW (ref 31.5–35.7)
MCV: 77 fL — ABNORMAL LOW (ref 79–97)
Monocytes Absolute: 0.6 10*3/uL (ref 0.1–0.9)
Monocytes: 9 %
Neutrophils Absolute: 4.6 10*3/uL (ref 1.4–7.0)
Neutrophils: 75 %
Platelets: 361 10*3/uL (ref 150–450)
RBC: 5.71 x10E6/uL — ABNORMAL HIGH (ref 3.77–5.28)
RDW: 16 % — ABNORMAL HIGH (ref 11.7–15.4)
WBC: 6.1 10*3/uL (ref 3.4–10.8)

## 2021-07-14 NOTE — Telephone Encounter (Signed)
Contacted patient today to complete Medicare AWV and had to leave a VM

## 2021-07-15 NOTE — Telephone Encounter (Signed)
Left the patient a VM requesting she call back to schedule Medicare AWV

## 2021-07-16 ENCOUNTER — Ambulatory Visit
Admission: RE | Admit: 2021-07-16 | Discharge: 2021-07-16 | Disposition: A | Discharge status: Home / Self Care | Payer: Medicare PPO | Source: Home / Self Care | Attending: Family Medicine | Admitting: Family Medicine

## 2021-07-16 ENCOUNTER — Ambulatory Visit
Admission: RE | Admit: 2021-07-16 | Discharge: 2021-07-16 | Disposition: A | Discharge status: Home / Self Care | Payer: Medicare PPO | Source: Ambulatory Visit | Attending: Family Medicine | Admitting: Family Medicine

## 2021-07-16 ENCOUNTER — Other Ambulatory Visit: Payer: Self-pay

## 2021-07-16 DIAGNOSIS — R059 Cough, unspecified: Secondary | ICD-10-CM | POA: Diagnosis not present

## 2021-07-16 DIAGNOSIS — J90 Pleural effusion, not elsewhere classified: Secondary | ICD-10-CM | POA: Insufficient documentation

## 2021-07-16 DIAGNOSIS — J45909 Unspecified asthma, uncomplicated: Secondary | ICD-10-CM | POA: Diagnosis not present

## 2021-07-16 DIAGNOSIS — J9811 Atelectasis: Secondary | ICD-10-CM | POA: Diagnosis not present

## 2021-07-16 NOTE — Assessment & Plan Note (Signed)
Still running low but doing OK. Continue current regimen. Continue to monitor. Call with any concerns.

## 2021-07-16 NOTE — Assessment & Plan Note (Signed)
Going up slightly with A1c of 6.9 and she is feeling worse- will start tradjenta due to kidney function and recheck 3 months.

## 2021-07-16 NOTE — Assessment & Plan Note (Signed)
Will repeat CXR. Await results. Treat as needed.

## 2021-07-17 ENCOUNTER — Telehealth: Payer: Self-pay

## 2021-07-17 ENCOUNTER — Ambulatory Visit (INDEPENDENT_AMBULATORY_CARE_PROVIDER_SITE_OTHER): Payer: Medicare PPO

## 2021-07-17 DIAGNOSIS — Z0000 Encounter for general adult medical examination without abnormal findings: Secondary | ICD-10-CM

## 2021-07-17 NOTE — Patient Instructions (Signed)
Health Maintenance, Female Adopting a healthy lifestyle and getting preventive care are important in promoting health and wellness. Ask your health care provider about: The right schedule for you to have regular tests and exams. Things you can do on your own to prevent diseases and keep yourself healthy. What should I know about diet, weight, and exercise? Eat a healthy diet  Eat a diet that includes plenty of vegetables, fruits, low-fat dairy products, and lean protein. Do not eat a lot of foods that are high in solid fats, added sugars, or sodium. Maintain a healthy weight Body mass index (BMI) is used to identify weight problems. It estimates body fat based on height and weight. Your health care provider can help determine your BMI and help you achieve or maintain a healthy weight. Get regular exercise Get regular exercise. This is one of the most important things you can do for your health. Most adults should: Exercise for at least 150 minutes each week. The exercise should increase your heart rate and make you sweat (moderate-intensity exercise). Do strengthening exercises at least twice a week. This is in addition to the moderate-intensity exercise. Spend less time sitting. Even light physical activity can be beneficial. Watch cholesterol and blood lipids Have your blood tested for lipids and cholesterol at 75 years of age, then have this test every 5 years. Have your cholesterol levels checked more often if: Your lipid or cholesterol levels are high. You are older than 75 years of age. You are at high risk for heart disease. What should I know about cancer screening? Depending on your health history and family history, you may need to have cancer screening at various ages. This may include screening for: Breast cancer. Cervical cancer. Colorectal cancer. Skin cancer. Lung cancer. What should I know about heart disease, diabetes, and high blood pressure? Blood pressure and heart  disease High blood pressure causes heart disease and increases the risk of stroke. This is more likely to develop in people who have high blood pressure readings, are of African descent, or are overweight. Have your blood pressure checked: Every 3-5 years if you are 18-39 years of age. Every year if you are 40 years old or older. Diabetes Have regular diabetes screenings. This checks your fasting blood sugar level. Have the screening done: Once every three years after age 40 if you are at a normal weight and have a low risk for diabetes. More often and at a younger age if you are overweight or have a high risk for diabetes. What should I know about preventing infection? Hepatitis B If you have a higher risk for hepatitis B, you should be screened for this virus. Talk with your health care provider to find out if you are at risk for hepatitis B infection. Hepatitis C Testing is recommended for: Everyone born from 1945 through 1965. Anyone with known risk factors for hepatitis C. Sexually transmitted infections (STIs) Get screened for STIs, including gonorrhea and chlamydia, if: You are sexually active and are younger than 75 years of age. You are older than 75 years of age and your health care provider tells you that you are at risk for this type of infection. Your sexual activity has changed since you were last screened, and you are at increased risk for chlamydia or gonorrhea. Ask your health care provider if you are at risk. Ask your health care provider about whether you are at high risk for HIV. Your health care provider may recommend a prescription medicine   to help prevent HIV infection. If you choose to take medicine to prevent HIV, you should first get tested for HIV. You should then be tested every 3 months for as long as you are taking the medicine. Pregnancy If you are about to stop having your period (premenopausal) and you may become pregnant, seek counseling before you get  pregnant. Take 400 to 800 micrograms (mcg) of folic acid every day if you become pregnant. Ask for birth control (contraception) if you want to prevent pregnancy. Osteoporosis and menopause Osteoporosis is a disease in which the bones lose minerals and strength with aging. This can result in bone fractures. If you are 65 years old or older, or if you are at risk for osteoporosis and fractures, ask your health care provider if you should: Be screened for bone loss. Take a calcium or vitamin D supplement to lower your risk of fractures. Be given hormone replacement therapy (HRT) to treat symptoms of menopause. Follow these instructions at home: Lifestyle Do not use any products that contain nicotine or tobacco, such as cigarettes, e-cigarettes, and chewing tobacco. If you need help quitting, ask your health care provider. Do not use street drugs. Do not share needles. Ask your health care provider for help if you need support or information about quitting drugs. Alcohol use Do not drink alcohol if: Your health care provider tells you not to drink. You are pregnant, may be pregnant, or are planning to become pregnant. If you drink alcohol: Limit how much you use to 0-1 drink a day. Limit intake if you are breastfeeding. Be aware of how much alcohol is in your drink. In the [place] one drink equals one 12 oz bottle of beer (355 mL), one 5 oz glass of wine (148 mL), or one 1 oz glass of hard liquor (44 mL). General instructions Schedule regular health, dental, and eye exams. Stay current with your vaccines. Tell your health care provider if: You often feel depressed. You have ever been abused or do not feel safe at home. Summary Adopting a healthy lifestyle and getting preventive care are important in promoting health and wellness. Follow your health care provider's instructions about healthy diet, exercising, and getting tested or screened for diseases. Follow your health care provider's  instructions on monitoring your cholesterol and blood pressure. This information is not intended to replace advice given to you by your health care provider. Make sure you discuss any questions you have with your health care provider. Document Revised: 12/15/2020 Document Reviewed: 09/30/2018 Elsevier Patient Education  2022 [employer]  

## 2021-07-17 NOTE — Telephone Encounter (Signed)
Dermatology appointment scheduled.  Patient aware.

## 2021-07-17 NOTE — Progress Notes (Signed)
Subjective:   Karen Farley is a 75 y.o. female who presents for Medicare Annual (Subsequent) preventive examination.I connected with  [name] on 07/17/21 by a audio enabled telemedicine application and verified that I am speaking with the correct person using two identifiers.   I discussed the limitations of evaluation and management by telemedicine. The patient expressed understanding and agreed to proceed.   Location of patient: home Location of provider: office [place] [facility] Rovner(patient) completed independently Glenetta Hew   Review of Systems    Defer to PCP       Objective:    Today's Vitals   07/17/21 1913  PainSc: 0-No pain   There is no height or weight on file to calculate BMI.  Advanced Directives 07/17/2021 05/17/2021 05/16/2021 04/10/2021 05/22/2020 01/22/2018 12/24/2016  Does Patient Have a Medical Advance Directive? Yes - No No Yes Yes Yes  Type of Advance Directive - - - - Living will;Healthcare Power of Attorney Living will;Healthcare Power of [place];Living will  Does patient want to make changes to medical advance directive? Yes (ED - Information included in AVS) - - - - - No - Patient declined  Copy of [place] in Chart? - - - - No - copy requested No - copy requested No - copy requested  Would patient like information on creating a medical advance directive? - No - Patient declined - No - Patient declined - - -    Current Medications (verified) Outpatient Encounter Medications as of 07/17/2021  Medication Sig   ACCU-CHEK GUIDE test strip USE TO CHECK BLOOD SUGAR DAILY   apixaban (ELIQUIS) 5 MG TABS tablet Take 1 tablet (5 mg total) by mouth 2 (two) times daily.   Elastic Bandages & Supports (MEDICAL COMPRESSION STOCKINGS) MISC by Does not apply route.   latanoprost (XALATAN) 0.005 % ophthalmic solution Place 1 drop into both eyes at bedtime.   linagliptin (TRADJENTA) 5 MG TABS tablet Take 1 tablet  (5 mg total) by mouth daily.   metoprolol succinate (TOPROL-XL) 50 MG 24 hr tablet TAKE 1 TABLET(50 MG) BY MOUTH DAILY WITH OR IMMEDIATELY FOLLOWING A MEAL   Multiple Vitamin (MULTIVITAMIN WITH MINERALS) TABS tablet Take 1 tablet by mouth daily.   torsemide (DEMADEX) 20 MG tablet Take 20 mg by mouth daily.   Vitamin D, Cholecalciferol, 50 MCG (2000 UT) CAPS Take 1 tablet by mouth.   [DISCONTINUED] metolazone (ZAROXOLYN) 2.5 MG tablet Take 1 tablet (2.5 mg total) by mouth daily.   [DISCONTINUED] spironolactone (ALDACTONE) 25 MG tablet Take 1 tablet (25 mg total) by mouth daily.   No facility-administered encounter medications on file as of 07/17/2021.    Allergies (verified) Patient has no known allergies.   History: Past Medical History:  Diagnosis Date   Chronic heart failure with preserved ejection fraction (HFpEF) ([place])    a. 11/2019 Echo: EF 60-65%, no rwma, Nl RV size/fxn. Mod dil LA.   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)    DDD (degenerative disc disease), lumbar    Degenerative disc disease, lumbar    bulging and dengerated   Diabetes mellitus without complication (HCC)    Hyperlipidemia    Hypertension    Lymphedema    Morbid obesity ([place])    Osteoarthritis of both knees    Osteopenia    Past Surgical History:  Procedure Laterality Date   CHOLECYSTECTOMY     DILATION AND CURETTAGE OF UTERUS  TEAR DUCT PROBING WITH STRABISMUS REPAIR Right    TONSILLECTOMY     Family History  Problem Relation Age of Onset   Heart disease Mother        CHF   Osteoporosis Mother    Breast cancer Mother    Heart disease Father 54   Cancer Brother        Colon CA- 2002- Youngest brother   Parkinson's disease Brother        Younger Brother   Heart disease Brother        2 brothers   Cancer Brother    Breast cancer Maternal Grandmother    Social History   Socioeconomic History   Marital status: Married    Spouse name: Not on file   Number of children: Not on  file   Years of education: Not on file   Highest education level: Not on file  Occupational History   Not on file  Tobacco Use   Smoking status: Never   Smokeless tobacco: Never  Vaping Use   Vaping Use: Never used  Substance and Sexual Activity   Alcohol use: Not Currently    Alcohol/week: 0.0 standard drinks    Comment: on rare occasion   Drug use: No   Sexual activity: Not Currently  Other Topics Concern   Not on file  Social History Narrative   Not on file   Social Determinants of Health   Financial Resource Strain: Low Risk    Difficulty of Paying Living Expenses: Not hard at all  Food Insecurity: No Food Insecurity   Worried About Charity fundraiser in the Last Year: Never true   Ran Out of Food in the Last Year: Never true  Transportation Needs: No Transportation Needs   Lack of Transportation (Medical): No   Lack of Transportation (Non-Medical): No  Physical Activity: Sufficiently Active   Days of Exercise per Week: 7 days   Minutes of Exercise per Session: 30 min  Stress: No Stress Concern Present   Feeling of Stress : Not at all  Social Connections: Moderately Integrated   Frequency of Communication with Friends and Family: More than three times a week   Frequency of Social Gatherings with Friends and Family: Twice a week   Attends Religious Services: 1 to 4 times per year   Active Member of [employer] or Organizations: No   Attends Music therapist: Never   Marital Status: Married    Tobacco Counseling Counseling given: Not Answered   Clinical Intake:  Pre-visit preparation completed: Yes  Pain : No/denies pain Pain Score: 0-No pain     Nutritional Risks: None Diabetes: Yes CBG done?: No Did pt. bring in CBG monitor from home?: No  How often do you need to have someone help you when you read instructions, pamphlets, or other written materials from your doctor or pharmacy?: 1 - Never What is the last grade level you completed in  school?: 2 years college  Diabetic?yes  Interpreter Needed?: No  Information entered by :: Glenetta Hew   Activities of Daily Living In your present state of health, do you have any difficulty performing the following activities: 07/13/2021 05/17/2021  Hearing? N N  Vision? N N  Difficulty concentrating or making decisions? N N  Walking or climbing stairs? N Y  Dressing or bathing? N Y  Doing errands, shopping? Y Y  Some recent data might be hidden    Patient Care Team: Valerie Roys, DO as PCP -  General (Family Medicine) Kate Sable, MD as PCP - Cardiology (Cardiology) Schnier, Dolores Lory, MD (Vascular Surgery) Murlean Iba, MD (Nephrology) Oneta Rack, MD (Dermatology)  Indicate any recent Medical Services you may have received from other than [facility] providers in the past year (date may be approximate).     Assessment:   This is a routine wellness examination for [place].  Hearing/Vision screen No results found.  Dietary issues and exercise activities discussed: Current Exercise Habits: Home exercise routine, Type of exercise: walking, Time (Minutes): 30, Frequency (Times/Week): 7, Weekly Exercise (Minutes/Week): 210, Intensity: Mild, Exercise limited by: None identified   Goals Addressed   None    Depression Screen PHQ 2/9 Scores 07/13/2021 06/01/2021 05/22/2020 04/27/2019 07/10/2018 01/22/2018 12/24/2016  PHQ - 2 Score 0 0 0 0 0 0 0  PHQ- 9 Score - - 0 0 - - -  Exception Documentation - - - - - - -    Fall Risk Fall Risk  07/17/2021 07/13/2021 06/01/2021 05/22/2020 04/27/2019  Falls in the past year? 0 0 0 1 0  Comment - - - lost balance -  Number falls in past yr: 0 0 0 0 0  Injury with Fall? 1 0 1 1 0  Comment broken wrist a year ago from a fall - - broke right wrist -  Risk for fall due to : History of fall(s) No Fall Risks Impaired balance/gait History of fall(s);Impaired balance/gait -  Follow up Falls evaluation completed Falls evaluation completed  Falls evaluation completed Falls evaluation completed;Education provided;Falls prevention discussed -    FALL RISK PREVENTION PERTAINING TO THE HOME:  Any stairs in or around the home? No  If so, are there any without handrails?  N/A Home free of loose throw rugs in walkways, pet beds, electrical cords, etc? No  Adequate lighting in your home to reduce risk of falls? Yes   ASSISTIVE DEVICES UTILIZED TO PREVENT FALLS:  Life alert? No  Use of a cane, Bilal Manzer or w/c? Yes  Grab bars in the bathroom? No  Shower chair or bench in shower? Yes  Elevated toilet seat or a handicapped toilet? No   TIMED UP AND GO:  Was the test performed?  N/A .  Length of time to ambulate 10 feet: N/A sec.      Cognitive Function:     6CIT Screen 07/17/2021 07/13/2021 05/22/2020 04/27/2019 01/22/2018  What Year? 0 points 0 points 0 points 0 points 0 points  What month? 0 points 0 points 0 points 0 points 0 points  What time? 0 points 0 points 0 points 0 points 0 points  Count back from 20 0 points 0 points 0 points 0 points 0 points  Months in reverse 0 points 0 points 0 points 0 points 0 points  Repeat phrase 0 points 2 points 0 points 0 points 0 points  Total Score 0 2 0 0 0    Immunizations Immunization History  Administered Date(s) Administered   Fluad Quad(high Dose 65+) 07/13/2021   Influenza, High Dose Seasonal PF 07/22/2019   Influenza-Unspecified 07/21/2020   PFIZER(Purple Top)SARS-COV-2 Vaccination 12/03/2019, 12/24/2019, 08/04/2020   Pneumococcal Conjugate-13 01/22/2018   Pneumococcal Polysaccharide-23 11/05/2011   Td 10/21/2008    TDAP status: Due, Education has been provided regarding the importance of this vaccine. Advised may receive this vaccine at local pharmacy or Health Dept. Aware to provide a copy of the vaccination record if obtained from local pharmacy or Health Dept. Verbalized acceptance and understanding.  Flu Vaccine  status: Up to date  Pneumococcal vaccine status: Up to  date  Covid-19 vaccine status: Completed vaccines  Qualifies for Shingles Vaccine? Yes   Zostavax completed No   Shingrix Completed?: No.    Education has been provided regarding the importance of this vaccine. Patient has been advised to call insurance company to determine out of pocket expense if they have not yet received this vaccine. Advised may also receive vaccine at local pharmacy or Health Dept. Verbalized acceptance and understanding.  Screening Tests Health Maintenance  Topic Date Due   COVID-19 Vaccine (4 - Booster for Pfizer series) 10/27/2020   OPHTHALMOLOGY EXAM  05/25/2021   URINE MICROALBUMIN  06/06/2021   Zoster Vaccines- Shingrix (1 of 2) 09/01/2021 (Originally 03/27/1965)   COLONOSCOPY (Pts 45-20yr Insurance coverage will need to be confirmed)  01/15/2022 (Originally 04/08/2013)   TETANUS/TDAP  01/15/2022 (Originally 10/21/2018)   FOOT EXAM  10/09/2021   HEMOGLOBIN A1C  01/10/2022   MAMMOGRAM  07/05/2022   INFLUENZA VACCINE  Completed   DEXA SCAN  Completed   Hepatitis C Screening  Completed   HPV VACCINES  Aged Out    Health Maintenance  Health Maintenance Due  Topic Date Due   COVID-19 Vaccine (4 - Booster for Pfizer series) 10/27/2020   OPHTHALMOLOGY EXAM  05/25/2021   URINE MICROALBUMIN  06/06/2021    Colorectal cancer screening: Referral to GI placed patient has declined. Pt aware the office will call re: appt.  Mammogram status: Ordered 07/17/21. Pt provided with contact info and advised to call to schedule appt.   Bone Density status: Ordered  . Pt provided with contact info and advised to call to schedule appt.patient has declined  Lung Cancer Screening: (Low Dose CT Chest recommended if Age 75-80years, 30 pack-year currently smoking OR have quit w/in 15years.) does not qualify.   Lung Cancer Screening Referral: N/A  Additional Screening:  Hepatitis C Screening: does not qualify; Completed   Vision Screening: Recommended annual ophthalmology  exams for early detection of glaucoma and other disorders of the eye. Is the patient up to date with their annual eye exam?  Yes  Who is the provider or what is the name of the office in which the patient attends annual eye exams? Dr. PGeorge InaIf pt is not established with a provider, would they like to be referred to a provider to establish care?  N/A .   Dental Screening: Recommended annual dental exams for proper oral hygiene  Community Resource Referral / Chronic Care Management: CRR required this visit?  No   CCM required this visit?  No      Plan:     I have personally reviewed and noted the following in the patient's chart:   Medical and social history Use of alcohol, tobacco or illicit drugs  Current medications and supplements including opioid prescriptions.  Functional ability and status Nutritional status Physical activity Advanced directives List of other physicians Hospitalizations, surgeries, and ER visits in previous 12 months Vitals Screenings to include cognitive, depression, and falls Referrals and appointments  In addition, I have reviewed and discussed with patient certain preventive protocols, quality metrics, and best practice recommendations. A written personalized care plan for preventive services as well as general preventive health recommendations were provided to patient.     LLinus Galas [place]  07/17/2021   Nurse Notes: non face to face 60 minutes  Ms. ELoanne Drilling, Thank you for taking time to come for your Medicare Wellness Visit. I appreciate your  ongoing commitment to your health goals. Please review the following plan we discussed and let me know if I can assist you in the future.   These are the goals we discussed:  Goals       Patient Stated     PharmD "I want to afford my medications" (pt-stated)      [place] (see longtitudinal plan of care for additional care plan information)  Current Barriers:  Polypharmacy; complex patient  with multiple comorbidities including new onset atrial fibrillation, HTN, T2DM, CKD, chronic pain, HLD  New onset Afib found by PCP; started on Xarelto and metoprolol and now followed by cardiology Dr. Garen Lah. Reports copay for Xarelto is $40/month, wonders if there is anything cheaper Most recent eGFR: 43 Afib/HTN, some LEE but preserved ED: CHADS2VASc (4- age, female, HTN, DM); metoprolol succinate 50 mg daily, benazepril 40 mg daily, furosemide 40 mg daily; Xarelto 20 mg daily (eGFR right at cut off for dose reduction); reports concerns w/ copay T2DM: last A1c 6.5%; metformin XR 1000 mg BID ASCVD risk reduction: previously declined statin therapy; 10 year ASCVD risk 23% Chronic pain: tramadol 50 mg PRN  Pharmacist Clinical Goal(s):  Over the next 90 days, patient will work with PharmD and provider towards optimized medication management  Interventions: Comprehensive medication review performed; medication list updated in electronic medical record Discussed afib, including physiologic reason for anticoagulant and beta blocker therapy.  Reviewed Xarelto patient assistance program criteria. Patient meets income criteria, but does not meet out of pocket spend requirement right now (program requires that patient spend 4% of total yearly household income on copays prior to approval). Discussed that Eliquis program is more attainable (only 3% of total household income required). Mailing written financial information to patient; she will review her household income and we will discuss moving forward. Noted that the program for Eliquis will also accept her husband's copay spend towards the total.  Patient asked how her lab work looked. We reviewed CKD, but spent a great amount of time talking about cholesterol, as well as ASCVD risk and benefit of statin therapy given DM and risk factors. Explained mechanism of statins, and that we would choose statins (eg rosuvastatin) less likely to cause muscle  symptoms. Patient concerned about adding another life long medication, but notes that she will contemplate this and discuss more w/ Dr. Wynetta Emery at upcoming appointment in April. We discussed moderate intensity option of rosuvastatin 5 mg daily  Patient Self Care Activities:  Patient will take medications as prescribed  Initial goal documentation       Other     DIET - INCREASE WATER INTAKE      ecommend drinking at least 6-8 glasses of water a day       Patient Stated      05/22/2020, like to get fluid retention under control        This is a list of the screening recommended for you and due dates:  Health Maintenance  Topic Date Due   COVID-19 Vaccine (4 - Booster for [place] series) 10/27/2020   Eye exam for diabetics  05/25/2021   Urine Protein Check  06/06/2021   Zoster (Shingles) Vaccine (1 of 2) 09/01/2021*   Colon Cancer Screening  01/15/2022*   Tetanus Vaccine  01/15/2022*   Complete foot exam   10/09/2021   Hemoglobin A1C  01/10/2022   Mammogram  07/05/2022   Flu Shot  Completed   DEXA scan (bone density measurement)  Completed   Hepatitis  C Screening: USPSTF Recommendation to screen - Ages 2-79 yo.  Completed   HPV Vaccine  Aged Out  *Topic was postponed. The date shown is not the original due date.

## 2021-07-20 ENCOUNTER — Other Ambulatory Visit: Payer: Self-pay | Admitting: Family Medicine

## 2021-07-20 ENCOUNTER — Encounter: Payer: Self-pay | Admitting: Family Medicine

## 2021-07-20 DIAGNOSIS — D485 Neoplasm of uncertain behavior of skin: Secondary | ICD-10-CM | POA: Diagnosis not present

## 2021-07-20 DIAGNOSIS — L570 Actinic keratosis: Secondary | ICD-10-CM | POA: Diagnosis not present

## 2021-07-20 DIAGNOSIS — J90 Pleural effusion, not elsewhere classified: Secondary | ICD-10-CM

## 2021-07-25 ENCOUNTER — Other Ambulatory Visit: Payer: Self-pay | Admitting: Family Medicine

## 2021-07-25 DIAGNOSIS — J90 Pleural effusion, not elsewhere classified: Secondary | ICD-10-CM

## 2021-07-26 ENCOUNTER — Telehealth: Payer: Self-pay | Admitting: Pulmonary Disease

## 2021-07-26 ENCOUNTER — Telehealth: Payer: Self-pay | Admitting: Family Medicine

## 2021-07-26 ENCOUNTER — Ambulatory Visit: Payer: Self-pay | Admitting: *Deleted

## 2021-07-26 ENCOUNTER — Inpatient Hospital Stay
Admission: EM | Admit: 2021-07-26 | Discharge: 2021-08-28 | DRG: 291 | Disposition: A | Discharge status: Home / Self Care | Payer: Medicare PPO | Attending: Internal Medicine | Admitting: Internal Medicine

## 2021-07-26 ENCOUNTER — Other Ambulatory Visit: Payer: Self-pay

## 2021-07-26 ENCOUNTER — Emergency Department: Payer: Medicare PPO

## 2021-07-26 DIAGNOSIS — R578 Other shock: Secondary | ICD-10-CM | POA: Diagnosis not present

## 2021-07-26 DIAGNOSIS — T380X5A Adverse effect of glucocorticoids and synthetic analogues, initial encounter: Secondary | ICD-10-CM | POA: Diagnosis not present

## 2021-07-26 DIAGNOSIS — I3139 Other pericardial effusion (noninflammatory): Secondary | ICD-10-CM | POA: Diagnosis present

## 2021-07-26 DIAGNOSIS — R188 Other ascites: Secondary | ICD-10-CM

## 2021-07-26 DIAGNOSIS — K921 Melena: Secondary | ICD-10-CM | POA: Diagnosis not present

## 2021-07-26 DIAGNOSIS — I5031 Acute diastolic (congestive) heart failure: Secondary | ICD-10-CM

## 2021-07-26 DIAGNOSIS — I130 Hypertensive heart and chronic kidney disease with heart failure and stage 1 through stage 4 chronic kidney disease, or unspecified chronic kidney disease: Principal | ICD-10-CM | POA: Diagnosis present

## 2021-07-26 DIAGNOSIS — I959 Hypotension, unspecified: Secondary | ICD-10-CM | POA: Diagnosis not present

## 2021-07-26 DIAGNOSIS — Z9889 Other specified postprocedural states: Secondary | ICD-10-CM | POA: Diagnosis not present

## 2021-07-26 DIAGNOSIS — E1165 Type 2 diabetes mellitus with hyperglycemia: Secondary | ICD-10-CM | POA: Diagnosis not present

## 2021-07-26 DIAGNOSIS — E871 Hypo-osmolality and hyponatremia: Secondary | ICD-10-CM | POA: Diagnosis present

## 2021-07-26 DIAGNOSIS — I4891 Unspecified atrial fibrillation: Secondary | ICD-10-CM

## 2021-07-26 DIAGNOSIS — K551 Chronic vascular disorders of intestine: Secondary | ICD-10-CM | POA: Diagnosis not present

## 2021-07-26 DIAGNOSIS — I2510 Atherosclerotic heart disease of native coronary artery without angina pectoris: Secondary | ICD-10-CM | POA: Diagnosis present

## 2021-07-26 DIAGNOSIS — N170 Acute kidney failure with tubular necrosis: Secondary | ICD-10-CM | POA: Diagnosis present

## 2021-07-26 DIAGNOSIS — T502X5A Adverse effect of carbonic-anhydrase inhibitors, benzothiadiazides and other diuretics, initial encounter: Secondary | ICD-10-CM | POA: Diagnosis present

## 2021-07-26 DIAGNOSIS — R0602 Shortness of breath: Secondary | ICD-10-CM | POA: Diagnosis not present

## 2021-07-26 DIAGNOSIS — I083 Combined rheumatic disorders of mitral, aortic and tricuspid valves: Secondary | ICD-10-CM | POA: Diagnosis not present

## 2021-07-26 DIAGNOSIS — R918 Other nonspecific abnormal finding of lung field: Secondary | ICD-10-CM | POA: Diagnosis not present

## 2021-07-26 DIAGNOSIS — E8881 Metabolic syndrome: Secondary | ICD-10-CM | POA: Diagnosis present

## 2021-07-26 DIAGNOSIS — J9811 Atelectasis: Secondary | ICD-10-CM | POA: Diagnosis not present

## 2021-07-26 DIAGNOSIS — D631 Anemia in chronic kidney disease: Secondary | ICD-10-CM | POA: Diagnosis not present

## 2021-07-26 DIAGNOSIS — Z820 Family history of epilepsy and other diseases of the nervous system: Secondary | ICD-10-CM

## 2021-07-26 DIAGNOSIS — E119 Type 2 diabetes mellitus without complications: Secondary | ICD-10-CM | POA: Diagnosis not present

## 2021-07-26 DIAGNOSIS — E1121 Type 2 diabetes mellitus with diabetic nephropathy: Secondary | ICD-10-CM | POA: Diagnosis not present

## 2021-07-26 DIAGNOSIS — I4821 Permanent atrial fibrillation: Secondary | ICD-10-CM | POA: Diagnosis not present

## 2021-07-26 DIAGNOSIS — I350 Nonrheumatic aortic (valve) stenosis: Secondary | ICD-10-CM | POA: Diagnosis present

## 2021-07-26 DIAGNOSIS — I272 Pulmonary hypertension, unspecified: Secondary | ICD-10-CM | POA: Diagnosis present

## 2021-07-26 DIAGNOSIS — I701 Atherosclerosis of renal artery: Secondary | ICD-10-CM | POA: Diagnosis not present

## 2021-07-26 DIAGNOSIS — Z8249 Family history of ischemic heart disease and other diseases of the circulatory system: Secondary | ICD-10-CM

## 2021-07-26 DIAGNOSIS — I4819 Other persistent atrial fibrillation: Secondary | ICD-10-CM | POA: Diagnosis not present

## 2021-07-26 DIAGNOSIS — Z7901 Long term (current) use of anticoagulants: Secondary | ICD-10-CM | POA: Diagnosis not present

## 2021-07-26 DIAGNOSIS — I9589 Other hypotension: Secondary | ICD-10-CM | POA: Diagnosis not present

## 2021-07-26 DIAGNOSIS — N189 Chronic kidney disease, unspecified: Secondary | ICD-10-CM | POA: Diagnosis not present

## 2021-07-26 DIAGNOSIS — R571 Hypovolemic shock: Secondary | ICD-10-CM | POA: Diagnosis not present

## 2021-07-26 DIAGNOSIS — R570 Cardiogenic shock: Secondary | ICD-10-CM | POA: Diagnosis not present

## 2021-07-26 DIAGNOSIS — J90 Pleural effusion, not elsewhere classified: Secondary | ICD-10-CM | POA: Diagnosis not present

## 2021-07-26 DIAGNOSIS — J9601 Acute respiratory failure with hypoxia: Secondary | ICD-10-CM | POA: Diagnosis present

## 2021-07-26 DIAGNOSIS — M170 Bilateral primary osteoarthritis of knee: Secondary | ICD-10-CM | POA: Diagnosis present

## 2021-07-26 DIAGNOSIS — E1122 Type 2 diabetes mellitus with diabetic chronic kidney disease: Secondary | ICD-10-CM | POA: Diagnosis not present

## 2021-07-26 DIAGNOSIS — I110 Hypertensive heart disease with heart failure: Secondary | ICD-10-CM | POA: Diagnosis not present

## 2021-07-26 DIAGNOSIS — R0609 Other forms of dyspnea: Secondary | ICD-10-CM

## 2021-07-26 DIAGNOSIS — I314 Cardiac tamponade: Secondary | ICD-10-CM | POA: Diagnosis not present

## 2021-07-26 DIAGNOSIS — Z20822 Contact with and (suspected) exposure to covid-19: Secondary | ICD-10-CM | POA: Diagnosis not present

## 2021-07-26 DIAGNOSIS — E785 Hyperlipidemia, unspecified: Secondary | ICD-10-CM | POA: Diagnosis present

## 2021-07-26 DIAGNOSIS — R579 Shock, unspecified: Secondary | ICD-10-CM

## 2021-07-26 DIAGNOSIS — E876 Hypokalemia: Secondary | ICD-10-CM | POA: Diagnosis present

## 2021-07-26 DIAGNOSIS — E1151 Type 2 diabetes mellitus with diabetic peripheral angiopathy without gangrene: Secondary | ICD-10-CM | POA: Diagnosis present

## 2021-07-26 DIAGNOSIS — N183 Chronic kidney disease, stage 3 unspecified: Secondary | ICD-10-CM

## 2021-07-26 DIAGNOSIS — I5033 Acute on chronic diastolic (congestive) heart failure: Secondary | ICD-10-CM | POA: Diagnosis present

## 2021-07-26 DIAGNOSIS — E860 Dehydration: Secondary | ICD-10-CM | POA: Diagnosis present

## 2021-07-26 DIAGNOSIS — D649 Anemia, unspecified: Secondary | ICD-10-CM | POA: Diagnosis not present

## 2021-07-26 DIAGNOSIS — E538 Deficiency of other specified B group vitamins: Secondary | ICD-10-CM | POA: Diagnosis present

## 2021-07-26 DIAGNOSIS — Z6841 Body Mass Index (BMI) 40.0 and over, adult: Secondary | ICD-10-CM | POA: Diagnosis not present

## 2021-07-26 DIAGNOSIS — N179 Acute kidney failure, unspecified: Secondary | ICD-10-CM | POA: Diagnosis not present

## 2021-07-26 DIAGNOSIS — Z7984 Long term (current) use of oral hypoglycemic drugs: Secondary | ICD-10-CM

## 2021-07-26 DIAGNOSIS — I517 Cardiomegaly: Secondary | ICD-10-CM | POA: Diagnosis not present

## 2021-07-26 DIAGNOSIS — Z79899 Other long term (current) drug therapy: Secondary | ICD-10-CM

## 2021-07-26 DIAGNOSIS — E861 Hypovolemia: Secondary | ICD-10-CM | POA: Diagnosis not present

## 2021-07-26 DIAGNOSIS — E1129 Type 2 diabetes mellitus with other diabetic kidney complication: Secondary | ICD-10-CM | POA: Diagnosis present

## 2021-07-26 DIAGNOSIS — K5790 Diverticulosis of intestine, part unspecified, without perforation or abscess without bleeding: Secondary | ICD-10-CM | POA: Diagnosis not present

## 2021-07-26 DIAGNOSIS — I700 Atherosclerosis of aorta: Secondary | ICD-10-CM | POA: Diagnosis not present

## 2021-07-26 DIAGNOSIS — Z803 Family history of malignant neoplasm of breast: Secondary | ICD-10-CM

## 2021-07-26 DIAGNOSIS — E873 Alkalosis: Secondary | ICD-10-CM | POA: Diagnosis not present

## 2021-07-26 DIAGNOSIS — Z800 Family history of malignant neoplasm of digestive organs: Secondary | ICD-10-CM

## 2021-07-26 DIAGNOSIS — D62 Acute posthemorrhagic anemia: Secondary | ICD-10-CM | POA: Diagnosis not present

## 2021-07-26 DIAGNOSIS — N1832 Chronic kidney disease, stage 3b: Secondary | ICD-10-CM | POA: Diagnosis not present

## 2021-07-26 DIAGNOSIS — Z452 Encounter for adjustment and management of vascular access device: Secondary | ICD-10-CM | POA: Diagnosis not present

## 2021-07-26 DIAGNOSIS — J918 Pleural effusion in other conditions classified elsewhere: Secondary | ICD-10-CM | POA: Diagnosis not present

## 2021-07-26 DIAGNOSIS — I340 Nonrheumatic mitral (valve) insufficiency: Secondary | ICD-10-CM | POA: Diagnosis not present

## 2021-07-26 DIAGNOSIS — J811 Chronic pulmonary edema: Secondary | ICD-10-CM | POA: Diagnosis not present

## 2021-07-26 DIAGNOSIS — Y92239 Unspecified place in hospital as the place of occurrence of the external cause: Secondary | ICD-10-CM | POA: Diagnosis present

## 2021-07-26 DIAGNOSIS — I5020 Unspecified systolic (congestive) heart failure: Secondary | ICD-10-CM | POA: Diagnosis not present

## 2021-07-26 DIAGNOSIS — K7460 Unspecified cirrhosis of liver: Secondary | ICD-10-CM | POA: Diagnosis not present

## 2021-07-26 DIAGNOSIS — I509 Heart failure, unspecified: Secondary | ICD-10-CM | POA: Diagnosis not present

## 2021-07-26 DIAGNOSIS — D259 Leiomyoma of uterus, unspecified: Secondary | ICD-10-CM | POA: Diagnosis not present

## 2021-07-26 DIAGNOSIS — Z9049 Acquired absence of other specified parts of digestive tract: Secondary | ICD-10-CM

## 2021-07-26 DIAGNOSIS — K922 Gastrointestinal hemorrhage, unspecified: Secondary | ICD-10-CM | POA: Diagnosis not present

## 2021-07-26 DIAGNOSIS — R601 Generalized edema: Secondary | ICD-10-CM | POA: Diagnosis not present

## 2021-07-26 DIAGNOSIS — I358 Other nonrheumatic aortic valve disorders: Secondary | ICD-10-CM | POA: Diagnosis not present

## 2021-07-26 DIAGNOSIS — I10 Essential (primary) hypertension: Secondary | ICD-10-CM | POA: Diagnosis not present

## 2021-07-26 DIAGNOSIS — N921 Excessive and frequent menstruation with irregular cycle: Secondary | ICD-10-CM | POA: Diagnosis not present

## 2021-07-26 DIAGNOSIS — I482 Chronic atrial fibrillation, unspecified: Secondary | ICD-10-CM | POA: Diagnosis not present

## 2021-07-26 DIAGNOSIS — Z8262 Family history of osteoporosis: Secondary | ICD-10-CM

## 2021-07-26 LAB — CBC
HCT: 42.5 % (ref 36.0–46.0)
Hemoglobin: 13.6 g/dL (ref 12.0–15.0)
MCH: 24.7 pg — ABNORMAL LOW (ref 26.0–34.0)
MCHC: 32 g/dL (ref 30.0–36.0)
MCV: 77.1 fL — ABNORMAL LOW (ref 80.0–100.0)
Platelets: 341 10*3/uL (ref 150–400)
RBC: 5.51 MIL/uL — ABNORMAL HIGH (ref 3.87–5.11)
RDW: 18.9 % — ABNORMAL HIGH (ref 11.5–15.5)
WBC: 8.1 10*3/uL (ref 4.0–10.5)
nRBC: 0 % (ref 0.0–0.2)

## 2021-07-26 LAB — RESP PANEL BY RT-PCR (FLU A&B, COVID) ARPGX2
Influenza A by PCR: NEGATIVE
Influenza B by PCR: NEGATIVE
SARS Coronavirus 2 by RT PCR: NEGATIVE

## 2021-07-26 LAB — BASIC METABOLIC PANEL
Anion gap: 15 (ref 5–15)
BUN: 65 mg/dL — ABNORMAL HIGH (ref 8–23)
CO2: 34 mmol/L — ABNORMAL HIGH (ref 22–32)
Calcium: 9.4 mg/dL (ref 8.9–10.3)
Chloride: 79 mmol/L — ABNORMAL LOW (ref 98–111)
Creatinine, Ser: 1.78 mg/dL — ABNORMAL HIGH (ref 0.44–1.00)
GFR, Estimated: 29 mL/min — ABNORMAL LOW (ref >60)
Glucose, Bld: 195 mg/dL — ABNORMAL HIGH (ref 70–99)
Potassium: 2.6 mmol/L — CL (ref 3.5–5.1)
Sodium: 128 mmol/L — ABNORMAL LOW (ref 135–145)

## 2021-07-26 LAB — HEPATIC FUNCTION PANEL
ALT: 16 U/L (ref 0–44)
AST: 27 U/L (ref 15–41)
Albumin: 3.2 g/dL — ABNORMAL LOW (ref 3.5–5.0)
Alkaline Phosphatase: 68 U/L (ref 38–126)
Bilirubin, Direct: 0.7 mg/dL — ABNORMAL HIGH (ref 0.0–0.2)
Indirect Bilirubin: 1.6 mg/dL — ABNORMAL HIGH (ref 0.3–0.9)
Total Bilirubin: 2.3 mg/dL — ABNORMAL HIGH (ref 0.3–1.2)
Total Protein: 6.1 g/dL — ABNORMAL LOW (ref 6.5–8.1)

## 2021-07-26 LAB — TSH: TSH: 2.18 u[IU]/mL (ref 0.350–4.500)

## 2021-07-26 LAB — BRAIN NATRIURETIC PEPTIDE: B Natriuretic Peptide: 154.2 pg/mL — ABNORMAL HIGH (ref 0.0–100.0)

## 2021-07-26 LAB — MAGNESIUM: Magnesium: 2.2 mg/dL (ref 1.7–2.4)

## 2021-07-26 LAB — TROPONIN I (HIGH SENSITIVITY): Troponin I (High Sensitivity): 17 ng/L (ref <18)

## 2021-07-26 MED ORDER — SODIUM CHLORIDE 0.9 % IV BOLUS
500.0000 mL | Freq: Once | INTRAVENOUS | Status: AC
  Administered 2021-07-26: 500 mL via INTRAVENOUS

## 2021-07-26 MED ORDER — POTASSIUM CHLORIDE 20 MEQ PO PACK
40.0000 meq | PACK | Freq: Once | ORAL | Status: AC
  Administered 2021-07-26: 40 meq via ORAL
  Filled 2021-07-26: qty 2

## 2021-07-26 MED ORDER — POTASSIUM CHLORIDE 10 MEQ/100ML IV SOLN
10.0000 meq | INTRAVENOUS | Status: AC
Start: 2021-07-26 — End: 2021-07-26
  Administered 2021-07-26 (×2): 10 meq via INTRAVENOUS
  Filled 2021-07-26 (×2): qty 100

## 2021-07-26 MED ORDER — METOPROLOL SUCCINATE ER 50 MG PO TB24
50.0000 mg | ORAL_TABLET | Freq: Every day | ORAL | Status: DC
  Filled 2021-07-26: qty 1

## 2021-07-26 MED ORDER — POTASSIUM CHLORIDE CRYS ER 20 MEQ PO TBCR
40.0000 meq | EXTENDED_RELEASE_TABLET | Freq: Every day | ORAL | Status: DC
  Administered 2021-07-27 – 2021-08-01 (×6): 40 meq via ORAL
  Filled 2021-07-26 (×4): qty 2
  Filled 2021-07-26: qty 4
  Filled 2021-07-26: qty 2

## 2021-07-26 MED ORDER — FUROSEMIDE 10 MG/ML IJ SOLN
20.0000 mg | Freq: Two times a day (BID) | INTRAMUSCULAR | Status: DC
  Administered 2021-07-27 – 2021-07-30 (×5): 20 mg via INTRAVENOUS
  Filled 2021-07-26 (×2): qty 2
  Filled 2021-07-26: qty 4
  Filled 2021-07-26 (×3): qty 2

## 2021-07-26 MED ORDER — LATANOPROST 0.005 % OP SOLN
1.0000 [drp] | Freq: Every day | OPHTHALMIC | Status: DC
  Administered 2021-07-27 – 2021-08-27 (×32): 1 [drp] via OPHTHALMIC
  Filled 2021-07-26 (×3): qty 2.5

## 2021-07-26 NOTE — Telephone Encounter (Signed)
Spoke to patient, who stated that she has not been contacted by IR for pleural effusion. Per patient, [facility] was going to arrange this. I suggested that she contact Dr. Durenda Age office in regards to this, as she has not been seen by our office.  Patient voiced her understanding  and had no further questions.  Nothing further needed at this time.

## 2021-07-26 NOTE — Telephone Encounter (Signed)
Summary: ED clinical advice   Patient states she is retaining fluid in her feet,legs and abdomen for more then 3 days, patient unsure if she should go to the ED.     Patient reports swelling increased swelling in feet, legs, and abdomen. Patient reports shortness of breath- feels pressure below lungs. Patient is using her compression stocking- but swelling is not going down- hard to move around.Advised ED for evaluation. Reason for Disposition  [1] Difficulty breathing AND [2] not severe  Answer Assessment - Initial Assessment Questions 1. ONSET: "When did the swelling start?" (e.g., minutes, hours, days)     Bilateral swelling- feet,legs 2. LOCATION: "What part of the leg is swollen?"  "Are both legs swollen or just one leg?"     All the leg 3. DEGREE OF SWELLING: "How large is the swelling?"  - LOCALIZED - Small area of swelling on part of one leg (estimate the size) - WIDESPREAD - Swelling involves a large part of leg (calf, thigh or whole leg) or both legs/feet     widespread 4. SEVERITY of WIDESPREAD SWELLING (e.g., Edema): "How bad is the swelling?" - MILD edema - swelling limited to foot and ankle, pitting edema < 1/4 inch deep, rest and elevation eliminate most or all swelling - MODERATE edema - swelling of lower leg to knee, pitting edema > 1/4 inch deep, rest and elevation only partially reduce swelling - SEVERE edema - swelling extends above knee, facial or hand swelling also present      severe 5. REDNESS: "Does the swelling look red or infected?"     Discoloration is present normally 6. PAIN: "Is there any pain?" If so, ask, "How bad is it?"     no 7. ITCH: "Does the swelling itch?" If so, ask, "How much?"     no 8. CAUSE: "What do you think caused the swelling?"      Edema 9. CHRONIC DISEASE: "Does your child have kidney, heart or liver disease?"     Afib  Protocols used: Leg or Foot Swelling-P-AH

## 2021-07-26 NOTE — Telephone Encounter (Signed)
Patient received a call from [facility] stating they do not drain fluid and to contact PCP office.

## 2021-07-26 NOTE — ED Notes (Signed)
223.6 lbs most recent weight from pt at home. Pt weighs daily

## 2021-07-26 NOTE — ED Notes (Signed)
Pt reports increased fluid retention on abdomen, legs, and tops of feet. Reports some generalized weakness but no dizziness. Mild SOB per pt but no change from how she has been recently. SpO2 98% on room air.

## 2021-07-26 NOTE — ED Provider Notes (Signed)
[facility] Emergency Department Provider Note  ____________________________________________  Time seen: Approximately 7:41 PM  I have reviewed the triage vital signs and the nursing notes.   HISTORY  Chief Complaint Edema    HPI Karen Farley is a 75 y.o. female with a history of CHF CKD diabetes hypertension atrial fibrillation on Eliquis and 3 diuretics who comes ED complaining of worsening dyspnea on exertion over the past week.  Gradual onset, constant, relieved with rest.  Endorses orthopnea.  No chest pain.  No fever or cough.  Karen Farley has been compliant with Karen Farley medications.  No vomiting or diarrhea.  Karen Farley also notes a history of a left pleural effusion that was incompletely drained by thoracentesis.  Karen Farley reports a 5 pound weight gain in the last 5 days.  Past Medical History:  Diagnosis Date   Chronic heart failure with preserved ejection fraction (HFpEF) ([place])    a. 11/2019 Echo: EF 60-65%, no rwma, Nl RV size/fxn. Mod dil LA.   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)    DDD (degenerative disc disease), lumbar    Degenerative disc disease, lumbar    bulging and dengerated   Diabetes mellitus without complication (HCC)    Hyperlipidemia    Hypertension    Lymphedema    Morbid obesity ([place])    Osteoarthritis of both knees    Osteopenia      Karen Farley Active Problem List   Diagnosis Date Noted   Anasarca    Stage 3b chronic kidney disease ([place]) 05/17/2021   Pleural effusion 05/17/2021   Acute on chronic diastolic CHF (congestive heart failure) ([place]) 05/17/2021   Obesity, Class III, BMI 40-49.9 (morbid obesity) ([place]) 05/17/2021   Chronic anticoagulation 05/17/2021   Hypotension 04/11/2021   Acute kidney injury superimposed on CKD ([place]) 04/11/2021   Hyponatremia 04/11/2021   (HFpEF) heart failure with preserved ejection fraction ([place]) 04/11/2021   Chronic venous insufficiency 08/14/2020   PAD (peripheral artery disease) ([place])  08/14/2020   Bilateral primary osteoarthritis of knee 04/13/2020   Type 2 diabetes mellitus ([place]) 04/13/2020   Acquired spondylolisthesis 12/17/2019   Osteoarthritis of knee 12/17/2019   Lumbar radiculopathy 12/17/2019   Atrial fibrillation ([place]) 11/20/2019   Closed fracture of radial styloid 08/06/2019   Displaced fracture of unspecified radial styloid process, initial encounter for closed fracture 08/06/2019   Controlled substance agreement signed 01/24/2018   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   Piriformis syndrome 07/01/2016   Sacroiliac joint dysfunction 01/09/2016   Joint disorder, unspecified 01/09/2016   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 diabetes mellitus ([place]) 04/28/2015   Lymphedema 03/27/2015   Edema 03/27/2015   Open-angle glaucoma, mild stage    Osteoarthritis of both knees    Type 2 diabetes mellitus with renal complication (HCC)    Osteopenia    Benign hypertensive renal disease    Hyperlipidemia    Degeneration of lumbosacral intervertebral disc      Past Surgical History:  Procedure Laterality Date   CHOLECYSTECTOMY     DILATION AND CURETTAGE OF UTERUS     TEAR DUCT PROBING WITH STRABISMUS REPAIR Right    TONSILLECTOMY       Prior to Admission medications   Medication Sig Start Date End Date Taking? Authorizing Provider  ACCU-CHEK GUIDE test strip USE TO CHECK BLOOD SUGAR DAILY 12/18/20   Park Liter P, DO  apixaban (ELIQUIS) 5 MG TABS tablet Take 1 tablet (5 mg total) by mouth 2 (  two) times daily. 04/18/21   Fritzi Mandes, MD  Elastic Bandages & Supports ([place]) [place] by Does not apply route.    Johnson, [name], DO  latanoprost (XALATAN) 0.005 % ophthalmic solution Place 1 drop into both eyes at bedtime.    [provider]  linagliptin (TRADJENTA) 5 MG TABS tablet Take 1 tablet (5 mg total) by mouth daily. 07/13/21   Johnson, [name], DO  metoprolol succinate (TOPROL-XL) 50 MG 24 hr tablet TAKE 1 TABLET(50  MG) BY MOUTH DAILY WITH OR IMMEDIATELY FOLLOWING A MEAL 04/30/21   Johnson, [name], DO  Multiple Vitamin (MULTIVITAMIN WITH MINERALS) TABS tablet Take 1 tablet by mouth daily.    [provider]  torsemide (DEMADEX) 20 MG tablet Take 20 mg by mouth daily.    [provider]  Vitamin D, Cholecalciferol, 50 MCG (2000 UT) CAPS Take 1 tablet by mouth.    [provider]  metolazone (ZAROXOLYN) 2.5 MG tablet Take 1 tablet (2.5 mg total) by mouth daily. 05/27/21 05/27/21  Nolberto Hanlon, MD  spironolactone (ALDACTONE) 25 MG tablet Take 1 tablet (25 mg total) by mouth daily. 05/28/21 05/27/21  Nolberto Hanlon, MD     Allergies Karen Farley has no known allergies.   Family History  Problem Relation Age of Onset   Heart disease Mother        CHF   Osteoporosis Mother    Breast cancer Mother    Heart disease Father 49   Cancer Brother        Colon [place]- 2002- Youngest brother   Parkinson's disease Brother        Younger Brother   Heart disease Brother        2 brothers   Cancer Brother    Breast cancer Maternal Grandmother     Social History Social History   Tobacco Use   Smoking status: Never   Smokeless tobacco: Never  Vaping Use   Vaping Use: Never used  Substance Use Topics   Alcohol use: Not Currently    Alcohol/week: 0.0 standard drinks    Comment: on rare occasion   Drug use: No    Review of Systems  Constitutional:   No fever or chills.  ENT:   No sore throat. No rhinorrhea. Cardiovascular:   No chest pain or syncope. Respiratory:   Positive shortness of breath as above without cough. Gastrointestinal:   Negative for abdominal pain, vomiting and diarrhea.  Musculoskeletal:   Positive peripheral edema bilateral lower extremities All other systems reviewed and are negative except as documented above in ROS and HPI.  ____________________________________________   PHYSICAL EXAM:  VITAL SIGNS: ED Triage Vitals  Enc Vitals Group     BP 07/26/21 1706 (!)  84/62     Pulse Rate 07/26/21 1700 73     Resp 07/26/21 1700 20     Temp 07/26/21 1700 98.2 F (36.8 C)     Temp Source 07/26/21 1700 Oral     SpO2 07/26/21 1700 100 %     Weight 07/26/21 1701 233 lb (105.7 kg)     Height 07/26/21 1701 5\' 2"  (1.575 m)     Head Circumference --      Peak Flow --      Pain Score 07/26/21 1700 0     Pain Loc --      Pain Edu? --      Excl. in [place]? --     Vital signs reviewed, nursing assessments reviewed.  Constitutional:   Alert and oriented. Non-toxic appearance. Eyes:   Conjunctivae are normal. EOMI. PERRL. ENT      Head:   Normocephalic and atraumatic.      Nose:   Normal      Mouth/Throat:   Dry mucous membranes      Neck:   No meningismus. Full ROM. Hematological/Lymphatic/Immunilogical:   No cervical lymphadenopathy. Cardiovascular:   RRR. Symmetric bilateral radial and DP pulses.  No murmurs. Cap refill less than 2 seconds. Respiratory:   Normal respiratory effort without tachypnea/retractions.  Diminished breath sounds at the left base.  No crackles or wheezing.   Gastrointestinal:   Soft and nontender. Non distended. There is no CVA tenderness.  No rebound, rigidity, or guarding. Genitourinary:   deferred Musculoskeletal:   Normal range of motion in all extremities. No joint effusions.  No lower extremity tenderness.  2+ pitting edema bilateral lower extremities, symmetric.[place] Kitchen Neurologic:   Normal speech and language.  Motor grossly intact. No acute focal neurologic deficits are appreciated.  Skin:    Skin is warm, dry and intact. No rash noted.  No petechiae, purpura, or bullae.  ____________________________________________    LABS (pertinent positives/negatives) (all labs ordered are listed, but only abnormal results are displayed) Labs Reviewed  BASIC METABOLIC PANEL - Abnormal; Notable for the following components:      Result Value   Sodium 128 (*)    Potassium 2.6 (*)    Chloride 79 (*)    CO2 34 (*)    Glucose, Bld 195  (*)    BUN 65 (*)    Creatinine, Ser 1.78 (*)    GFR, Estimated 29 (*)    All other components within normal limits  CBC - Abnormal; Notable for the following components:   RBC 5.51 (*)    MCV 77.1 (*)    MCH 24.7 (*)    RDW 18.9 (*)    All other components within normal limits  BRAIN NATRIURETIC PEPTIDE - Abnormal; Notable for the following components:   B Natriuretic Peptide 154.2 (*)    All other components within normal limits  HEPATIC FUNCTION PANEL - Abnormal; Notable for the following components:   Total Protein 6.1 (*)    Albumin 3.2 (*)    Total Bilirubin 2.3 (*)    Bilirubin, Direct 0.7 (*)    Indirect Bilirubin 1.6 (*)    All other components within normal limits  RESP PANEL BY RT-PCR (FLU A&B, COVID) ARPGX2  MAGNESIUM  URINALYSIS, COMPLETE (UACMP) WITH MICROSCOPIC  TROPONIN I (HIGH SENSITIVITY)   ____________________________________________   EKG  Interpreted by me Atrial fibrillation, rate of 73.  Normal axis and intervals.  Poor R wave progression.  Normal ST segments and T waves.  No ischemic changes.  ____________________________________________    GLOVFIEPP  DG Chest Portable 1 View  Result Date: 07/26/2021 CLINICAL DATA:  Shortness of breath EXAM: PORTABLE CHEST 1 VIEW COMPARISON:  07/16/2021 FINDINGS: Large left-sided pleural effusion slightly increased. Probable trace right effusion. Cardiomegaly with vascular congestion. Persistent consolidation in the left lower lung. IMPRESSION: Large left-sided pleural effusion with airspace disease in the left mid to lower lung. Trace right pleural effusion. Cardiomegaly with vascular congestion Electronically Signed   By: Donavan Foil M.D.   On: 07/26/2021 18:10    ____________________________________________   PROCEDURES Procedures  ____________________________________________  DIFFERENTIAL DIAGNOSIS   CHF exacerbation, pulmonary edema, worsening pleural effusion, pneumonia, electrolyte abnormality,  dehydration, non-STEMI, viral illness  CLINICAL IMPRESSION / ASSESSMENT AND PLAN /  ED COURSE  Medications ordered in the ED: Medications  potassium chloride 10 mEq in 100 mL IVPB (10 mEq Intravenous New Bag/Given 07/26/21 1910)  sodium chloride 0.9 % bolus 500 mL (500 mLs Intravenous New Bag/Given 07/26/21 1909)  potassium chloride (KLOR-CON) packet 40 mEq (40 mEq Oral Given 07/26/21 1920)    Pertinent labs & imaging results that were available during my care of the Karen Farley were reviewed by me and considered in my medical decision making (see chart for details).  EMALYNN CLEWIS was evaluated in Emergency Department on 07/26/2021 for the symptoms described in the history of present illness. Karen Farley was evaluated in the context of the global COVID-19 pandemic, which necessitated consideration that the Karen Farley might be at risk for infection with the SARS-CoV-2 virus that causes COVID-19. Institutional protocols and algorithms that pertain to the evaluation of patients at risk for COVID-19 are in a state of rapid change based on information released by regulatory bodies including the CDC and federal and state organizations. These policies and algorithms were followed during the Karen Farley's care in the ED.   Karen Farley presents with dyspnea on exertion.  Oxygenation is normal at rest, vital signs essentially unremarkable.  Initial blood pressure was low but this was not reproduced on subsequent readings.  Clinically Karen Farley appears dehydrated.  Labs show low sodium, low potassium, elevated BUN which is consistent with intravascular depletion related to Karen Farley multiple diuretics despite third spaced pitting edema.  Karen Farley does not have severe hypoalbuminemia.  Chest x-ray viewed and interpreted by me, shows persistent left-sided pleural effusion, possibly increased from previous.  Radiology report reviewed.  No pulmonary edema, no pneumothorax, no consolidation.  Will give ceftriaxone and azithromycin, gentle IV fluids.   Karen Farley will need to be admitted due to the high complexity of Karen Farley management between CHF and CKD.  Troponin is normal, doubt ACS.  Doubt PE dissection or pericardial effusion.      ____________________________________________   FINAL CLINICAL IMPRESSION(S) / ED DIAGNOSES    Final diagnoses:  DOE (dyspnea on exertion)  Recurrent left pleural effusion  Morbid obesity (HCC)  Chronic congestive heart failure, unspecified heart failure type ([place])  Atrial fibrillation, unspecified type ([place])  Type 2 diabetes mellitus without complication, without long-term current use of insulin [facility])     ED Discharge Orders     None       Portions of this note were generated with dragon dictation software. Dictation errors may occur despite best attempts at proofreading.    Carrie Mew, MD 07/26/21 [phone]

## 2021-07-26 NOTE — ED Triage Notes (Signed)
Pt with hx of getting fluid drained from below her lungs this past July. Pt with increasing edema and SOB since then. Pt with bilateral LE edema and in abdomen. Pt in NAD on room air at this time.

## 2021-07-26 NOTE — Telephone Encounter (Signed)
Already handled through secure chat yesterday

## 2021-07-26 NOTE — ED Notes (Signed)
Date and time results received: 07/26/21 1733 Test: K Critical Value: 2.6  Name of Provider Notified: Joni Fears  Orders Received? Or Actions Taken?: MD notified and cardiac monitoring hooked to patient in room.

## 2021-07-26 NOTE — ED Notes (Signed)
This RN entered patient's room to find IV pump beeping Both fluids and potassium were finished running, this RN stopped both infusions and charted same Additional run of potassium ordered and started with maintenance fluid, please see MAR for details Patient denies any pain at IV site, IV site noted to be clean, dry, intact and free of redness or swelling Patient with family member at bedside and call bell within reach, no further needs at this time.

## 2021-07-26 NOTE — H&P (Signed)
History and Physical    Karen Farley XTK:[id number] DOB: May 24, 1946 DOA: 07/26/2021  PCP: Valerie Roys, DO  Patient coming from: Home  I have personally briefly reviewed patient's old medical records in [place]  Chief Complaint: Increasing shortness of breath and edema  HPI: Karen Farley is a 75 y.o. female with medical history significant for HFpEF, permanent atrial fibrillation on Eliquis, PAD, type 2 diabetes, CKD stage IIIb, lymphedema who presents with concerns of increasing shortness of breath and edema.  Patient notes that since her last hospitalization in August she has been having increasing abdominal distention.  Had increasing lower extremity edema on top of her feet for the past 2 weeks.  She also notes worsening dyspnea on exertion for the past several weeks and had chest x-ray with PCP showing recurrence of left-sided pleural effusion.  She was in the process of being referred to pulmonology for thoracentesis but could not wait for the appointment due to worsening symptoms and decided to present to the ED.  Patient last admitted on 7/28-8/7 with Acute on chronic diastolic CHF, left-sided pleural effusion and acute on chronic CKD.  Both cardiology and nephrology was consulted and she was treated with IV diuretic. Had thoracentesis on 8/1 with 400 cc removed likely transudative fluid based on limited lab work  she had metolazone, torsemide and Aldactone held at discharge. She had follow-up with nephrology on 8/11 and had restarted torsemide 20 mg 3 times per week and metolazone 2.5 mg once a week.  Later switched Torsemide every other day since she had discussion with nephrology that it would help make it easier for  her. States she still has good urine output with changes in regimen.  However she has been monitoring her weight daily and has noted a 5lb  weight gain from 218 to 223lb since August. Last dose of Torsemide yesterday.   ED Course: She was  afebrile, hypotensive down to 84/62 initially which improved with 500 cc normal saline fluid.  Sodium of 128, K of 2.6, chloride of 79, creatinine of 1.78 around her baseline, BG 195.  Magnesium of 2.2.  BNP of 154.  Normal AST and ALT.  T bili mildly elevated at 2.6. Chest x-ray showing large left-sided pleural effusion.  Review of Systems: Constitutional: + Weight Change, No Fever ENT/Mouth: No sore throat, No Rhinorrhea Eyes: No Eye Pain, No Vision Changes Cardiovascular: No Chest Pain, + SOB, No PND, + Dyspnea on Exertion, No Orthopnea,  + Edema, No Palpitations Respiratory: No Cough, Gastrointestinal: No Nausea, No Vomiting Genitourinary: no Urinary Incontinence Musculoskeletal: No Arthralgias, No Myalgias Skin: No Skin Lesions, No Pruritus, Neuro: no Weakness, No Numbness Psych: No Anxiety/Panic, No Depression, no decrease appetite Heme/Lymph: No Bruising, No Bleeding  Past Medical History:  Diagnosis Date   Chronic heart failure with preserved ejection fraction (HFpEF) ([place])    a. 11/2019 Echo: EF 60-65%, no rwma, Nl RV size/fxn. Mod dil LA.   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 She has never used smokeless tobacco. She reports that she does not currently use alcohol. She reports that she does not use drugs. Social History  No Known Allergies  Family History  Problem Relation Age of Onset   Heart disease Mother        CHF   Osteoporosis Mother    Breast cancer Mother     Heart disease Father 18   Cancer Brother        Colon CA- 2002- Youngest brother   Parkinson's disease Brother        Younger Brother   Heart disease Brother        2 brothers   Cancer Brother    Breast cancer Maternal Grandmother      Prior to Admission medications   Medication Sig Start Date End Date Taking? Authorizing Provider  ACCU-CHEK GUIDE test strip USE TO CHECK BLOOD SUGAR DAILY 12/18/20   Park Liter P, DO  apixaban (ELIQUIS) 5 MG TABS tablet Take 1 tablet (5 mg total) by mouth 2 (two) times daily. 04/18/21   Fritzi Mandes, MD  Elastic Bandages & Supports ([place]) [place] by Does not apply route.    Johnson, [name], DO  latanoprost (XALATAN) 0.005 % ophthalmic solution Place 1 drop into both eyes at bedtime.    [provider]  linagliptin (TRADJENTA) 5 MG TABS tablet Take 1 tablet (5 mg total) by mouth daily. 07/13/21   Johnson, [name], DO  metoprolol succinate (TOPROL-XL) 50 MG 24 hr tablet TAKE 1 TABLET(50 MG) BY MOUTH DAILY WITH OR IMMEDIATELY FOLLOWING A MEAL 04/30/21   Johnson, [name], DO  Multiple Vitamin (MULTIVITAMIN WITH MINERALS) TABS tablet Take 1 tablet by mouth daily.    [provider]  torsemide (DEMADEX) 20 MG tablet Take 20 mg by mouth daily.    [provider]  Vitamin D, Cholecalciferol, 50 MCG (2000 UT) CAPS Take 1 tablet by mouth.    [provider]  metolazone (ZAROXOLYN) 2.5 MG tablet Take 1 tablet (2.5 mg total) by mouth daily. 05/27/21 05/27/21  Nolberto Hanlon, MD  spironolactone (ALDACTONE) 25 MG tablet Take 1 tablet (25 mg total) by mouth daily. 05/28/21 05/27/21  Nolberto Hanlon, MD    Physical Exam: Vitals:   07/26/21 1845 07/26/21 1915 07/26/21 1930 07/26/21 2000  BP: 93/68 95/76 94/60  95/79  Pulse: 73 71 77 73  Resp: 18 19 (!) 24 18  Temp:      TempSrc:      SpO2: 94% 95% 95% 100%  Weight:      Height:        Constitutional: NAD, calm, comfortable, elderly female sitting upright with feet  hanging off side of bed Vitals:   07/26/21 1845 07/26/21 1915 07/26/21 1930 07/26/21 2000  BP: 93/68 95/76 94/60  95/79  Pulse: 73 71 77 73  Resp: 18 19 (!) 24 18  Temp:      TempSrc:      SpO2: 94% 95% 95% 100%  Weight:      Height:       Eyes: PERRL, lids and conjunctivae normal ENMT: Mucous membranes are moist.  Respiratory: clear to auscultation bilaterally, no wheezing, no crackles. Normal respiratory effort on room air. No accessory muscle use.  Able to speak in full sentences Cardiovascular: Regular rate and rhythm, no murmurs / rubs / gallops.  Anasarca with +3 pitting edema of bilateral lower extremity up to abdomen  abdomen: no tenderness, no masses palpated. Bowel sounds positive.  Musculoskeletal: no clubbing / cyanosis. No joint deformity upper and lower extremities. Good ROM, no contractures. Normal muscle tone.  Skin: Ecchymosis on upper extremity from IV line access Neurologic: CN 2-12 grossly intact. Sensation intact, Strength 5/5 in all 4.  Psychiatric: Normal judgment and insight. Alert and oriented x 3. Normal mood.    Labs on Admission: I have personally reviewed following labs and imaging studies  CBC: Recent Labs  Lab 07/26/21 1708  WBC 8.1  HGB 13.6  HCT 42.5  MCV 77.1*  PLT 734   Basic Metabolic Panel: Recent Labs  Lab 07/26/21 1708  NA 128*  K 2.6*  CL 79*  CO2 34*  GLUCOSE 195*  BUN 65*  CREATININE 1.78*  CALCIUM 9.4  MG 2.2   GFR: Estimated Creatinine Clearance: 31.2 mL/min (A) (by C-G formula based on SCr of 1.78 mg/dL (H)). Liver Function Tests: Recent Labs  Lab 07/26/21 1708  AST 27  ALT 16  ALKPHOS 68  BILITOT 2.3*  PROT 6.1*  ALBUMIN 3.2*   No results for input(s): LIPASE, AMYLASE in the last 168 hours. No results for input(s): AMMONIA in the last 168 hours. Coagulation Profile: No results for input(s): INR, PROTIME in the last 168 hours. Cardiac Enzymes: No results for input(s): CKTOTAL, CKMB, CKMBINDEX, TROPONINI in  the last 168 hours. BNP (last 3 results) No results for input(s): PROBNP in the last 8760 hours. HbA1C: No results for input(s): HGBA1C in the last 72 hours. CBG: No results for input(s): GLUCAP in the last 168 hours. Lipid Profile: No results for input(s): CHOL, HDL, LDLCALC, TRIG, CHOLHDL, LDLDIRECT in the last 72 hours. Thyroid Function Tests: No results for input(s): TSH, T4TOTAL, FREET4, T3FREE, THYROIDAB in the last 72 hours. Anemia Panel: No results for input(s): VITAMINB12, FOLATE, FERRITIN, TIBC, IRON, RETICCTPCT in the last 72 hours. Urine analysis:    Component Value Date/Time   COLORURINE YELLOW (A) 04/11/2021 1215   APPEARANCEUR HAZY (A) 04/11/2021 1215   APPEARANCEUR Clear 06/06/2020 1339   LABSPEC 1.011 04/11/2021 1215   PHURINE 5.0 04/11/2021 1215   GLUCOSEU NEGATIVE 04/11/2021 1215   HGBUR NEGATIVE 04/11/2021 1215   BILIRUBINUR NEGATIVE 04/11/2021 1215   BILIRUBINUR Negative 06/06/2020 1339   KETONESUR NEGATIVE 04/11/2021 1215   PROTEINUR NEGATIVE 04/11/2021 1215   NITRITE NEGATIVE 04/11/2021 1215   LEUKOCYTESUR MODERATE (A) 04/11/2021 1215    Radiological Exams on Admission: DG Chest Portable 1 View  Result Date: 07/26/2021 CLINICAL DATA:  Shortness of breath EXAM: PORTABLE CHEST 1 VIEW COMPARISON:  07/16/2021 FINDINGS: Large left-sided pleural effusion slightly increased. Probable trace right effusion. Cardiomegaly with vascular congestion. Persistent consolidation in the left lower lung. IMPRESSION: Large left-sided pleural effusion with airspace disease in the left mid to lower lung. Trace right pleural effusion. Cardiomegaly with vascular congestion Electronically Signed   By: Donavan Foil M.D.   On: 07/26/2021 18:10      Assessment/Plan  Acute on chronic diastolic CHF Anasarca -Pt on torsemide 20 mg 3 times per week and metolazone 2.5 mg once a week here with large pleural effusion, anasarca and increasing LE edema -Very complex heart failure and CKD  patient. Her creatinine has been sensitive to increasing diuresis in the past. Also very susceptible to hypovolemia and hypotension to increasing diuresis.  -needs cardiology and nephrology consult. There was discussion of referring for placement of CardioMEMS by  cardiology in the past which needs to be re-addressed.  -recent echo on 04/2021 with EF of 60-65%  -start IV Lasix 20mg  BID tomorrow   Left pleural effusion secondary to diastolic CHF -last paracentesis on 8/1 with 400cc fluid removed -Obtain paracentesis  Hypotension -improved with 500cc fluid. Pt has hx of hypotension from diuresis -continue to monitor overnight. Hold diuresis overnight and resume tomorrow  Hypokalemia -Likely due to diuretics - Daily oral supplementation  Hyponatremia Sodium of 128 likely from diuresis  CKD stage 3b -creatinine stable at 1.78 close to baseline around 1.4-1.7  Permanent atrial fibrillation Last Eliquis dose this morning.  Hold for paracentesis tomorrow  Type 2 diabetes Diet controlled   DVT prophylaxis:.on Eliquis Code Status: Full Family Communication: Plan discussed with patient and husband at bedside  disposition Plan: Home with at least 2 midnight stays  Consults called:  Admission status: inpatient   Level of care: Progressive Cardiac  Status is: Inpatient  Remains inpatient appropriate because:Inpatient level of care appropriate due to severity of illness  Dispo: The patient is from: Home              Anticipated d/c is to: Home              Patient currently is not medically stable to d/c.   Difficult to place patient No         Orene Desanctis DO [facility]   If 7PM-7AM, please contact night-coverage [email or web address]   07/26/2021, 8:27 PM

## 2021-07-27 ENCOUNTER — Encounter: Payer: Self-pay | Admitting: Family Medicine

## 2021-07-27 DIAGNOSIS — I5033 Acute on chronic diastolic (congestive) heart failure: Secondary | ICD-10-CM | POA: Diagnosis not present

## 2021-07-27 LAB — BASIC METABOLIC PANEL
Anion gap: 14 (ref 5–15)
Anion gap: 12 (ref 5–15)
BUN: 63 mg/dL — ABNORMAL HIGH (ref 8–23)
BUN: 62 mg/dL — ABNORMAL HIGH (ref 8–23)
CO2: 33 mmol/L — ABNORMAL HIGH (ref 22–32)
CO2: 32 mmol/L (ref 22–32)
Calcium: 9.4 mg/dL (ref 8.9–10.3)
Calcium: 9.1 mg/dL (ref 8.9–10.3)
Chloride: 83 mmol/L — ABNORMAL LOW (ref 98–111)
Chloride: 82 mmol/L — ABNORMAL LOW (ref 98–111)
Creatinine, Ser: 1.57 mg/dL — ABNORMAL HIGH (ref 0.44–1.00)
Creatinine, Ser: 1.85 mg/dL — ABNORMAL HIGH (ref 0.44–1.00)
GFR, Estimated: 34 mL/min — ABNORMAL LOW (ref >60)
GFR, Estimated: 28 mL/min — ABNORMAL LOW (ref >60)
Glucose, Bld: 147 mg/dL — ABNORMAL HIGH (ref 70–99)
Glucose, Bld: 201 mg/dL — ABNORMAL HIGH (ref 70–99)
Potassium: 2.8 mmol/L — ABNORMAL LOW (ref 3.5–5.1)
Potassium: 3.5 mmol/L (ref 3.5–5.1)
Sodium: 130 mmol/L — ABNORMAL LOW (ref 135–145)
Sodium: 126 mmol/L — ABNORMAL LOW (ref 135–145)

## 2021-07-27 MED ORDER — POTASSIUM CHLORIDE CRYS ER 20 MEQ PO TBCR
40.0000 meq | EXTENDED_RELEASE_TABLET | Freq: Once | ORAL | Status: AC
  Administered 2021-07-27: 40 meq via ORAL
  Filled 2021-07-27: qty 2

## 2021-07-27 NOTE — Progress Notes (Signed)
PROGRESS NOTE  Karen Farley Farley:[id number] DOB: 04/23/1946 DOA: 07/26/2021 PCP: Valerie Roys, DO  Brief History    Karen Farley is a 75 y.o. female with medical history significant for HFpEF, permanent atrial fibrillation on Eliquis, PAD, type 2 diabetes, CKD stage IIIb, lymphedema who presents with concerns of increasing shortness of breath and edema.   Patient notes that since her last hospitalization in August she has been having increasing abdominal distention.  Had increasing lower extremity edema on top of her feet for the past 2 weeks.  She also notes worsening dyspnea on exertion for the past several weeks and had chest x-ray with PCP showing recurrence of left-sided pleural effusion.  She was in the process of being referred to pulmonology for thoracentesis but could not wait for the appointment due to worsening symptoms and decided to present to the ED.   Patient last admitted on 7/28-8/7 with Acute on chronic diastolic CHF, left-sided pleural effusion and acute on chronic CKD.  Both cardiology and nephrology was consulted and she was treated with IV diuretic. Had thoracentesis on 8/1 with 400 cc removed likely transudative fluid based on limited lab work  she had metolazone, torsemide and Aldactone held at discharge. She had follow-up with nephrology on 8/11 and had restarted torsemide 20 mg 3 times per week and metolazone 2.5 mg once a week.  Later switched Torsemide every other day since she had discussion with nephrology that it would help make it easier for  her. States she still has good urine output with changes in regimen.  However she has been monitoring her weight daily and has noted a 5lb  weight gain from 218 to 223lb since August. Last dose of Torsemide yesterday.    ED Course: She was afebrile, hypotensive down to 84/62 initially which improved with 500 cc normal saline fluid.  Sodium of 128, K of 2.6, chloride of 79, creatinine of 1.78 around her baseline, BG  195.  Magnesium of 2.2.  BNP of 154.  Normal AST and ALT.  T bili mildly elevated at 2.6. Chest x-ray showing large left-sided pleural effusion.   Consultants  None  Procedures  None  Antibiotics   Anti-infectives (From admission, onward)    None        Subjective  The patient is sitting up at bedside. She states that she doesn't think that she had much more urine out than usual.   Objective   Vitals:  Vitals:   07/27/21 1608 07/27/21 1626  BP: 99/68 104/67  Pulse: 77 77  Resp: 14 16  Temp:    SpO2: 97% 94%    Exam:  Constitutional:  Awake, alert, and oriented x 3.  Respiratory:  CTA bilaterally, no w/r/r.  Respiratory effort normal. No retractions or accessory muscle use Cardiovascular:  RRR, no m/r/g No LE extremity edema   Normal pedal pulses Abdomen:  Abdomen appears normal; no tenderness or masses No hernias No HSM Musculoskeletal:  Digits/nails BUE: no clubbing, cyanosis, petechiae, infection exam of joints, bones, muscles of at least one of following: head/neck, RUE, LUE, RLE, LLE   Skin:  No rashes, lesions, ulcers palpation of skin: no induration or nodules Neurologic:  CN 2-12 intact Sensation all 4 extremities intact Psychiatric:  Mental status Mood, affect appropriate Orientation to person, place, time  judgment and insight appear intact     I have personally reviewed the following:   Today's Data  Vitals  Lab Data  CBC, BMP  Imaging  CXR  Cardiology Data  EKG  Scheduled Meds:  furosemide  20 mg Intravenous BID   latanoprost  1 drop Both Eyes QHS   metoprolol succinate  50 mg Oral Daily   potassium chloride  40 mEq Oral Daily   Continuous Infusions:  Principal Problem:   Acute on chronic diastolic CHF (congestive heart failure) ([facility]CC) Active Problems:   Type 2 diabetes mellitus with renal complication ([facility])   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 diabetes mellitus ([facility])   Atrial fibrillation ([facility])   Hypotension   Pleural  effusion   LOS: 1 day  Acute on chronic diastolic CHF Anasarca -Pt on torsemide 20 mg 3 times per week and metolazone 2.5 mg once a week here with large pleural effusion, anasarca and increasing LE edema -Very complex heart failure and CKD patient. Her creatinine has been sensitive to increasing diuresis in the past. Also very susceptible to hypovolemia and hypotension to increasing diuresis.  -needs cardiology and nephrology consult. There was discussion of referring for placement of CardioMEMS by cardiology in the past which needs to be re-addressed.  -recent echo on 04/2021 with EF of 60-65%  -start IV Lasix 40 mg IV bid, but diuresis may be limited by hypotension.    Left pleural effusion secondary to diastolic CHF Consult pulm for thoracentesis if unable to diurese off.   Hypotension BP low normal. Monitor. May limit ability to diurese   Hypokalemia -Likely due to diuretics - Daily oral supplementation   Hyponatremia Sodium of 128 likely from diuresis improved to 130.   CKD stage 3b -creatinine stable at 1.57 close to baseline around 1.4-1.7   Permanent atrial fibrillation Last Eliquis dose this morning.  Held   Type 2 diabetes Diet controlled   I have seen and examined this patient myself. I have spent 34 minutes in her evaluation and care.   DVT prophylaxis:.on Eliquis Code Status: Full Family Communication: Plan discussed with patient and husband at bedside  disposition Plan: Home with at least 2 midnight stays  Consults called:  Admission status: inpatient     Level of care: Progressive Cardiac   Status is: Inpatient   Remains inpatient appropriate because:Inpatient level of care appropriate due to severity of illness   Dispo: The patient is from: Home              Anticipated d/c is to: Home              Patient currently is not medically stable to d/c.              Difficult to place patient No   Alfonso Carden, DO [facility] 07/27/2021  7:15

## 2021-07-27 NOTE — ED Notes (Signed)
Nikki RN aware of assigned bed 

## 2021-07-27 NOTE — Consult Note (Signed)
   Heart Failure Nurse Navigator Note  HFpEF 60 to 65%.  She presented to the emergency room with complaints of worsening shortness of breath, abdominal distention and lower extremity edema.   Comorbidities:  Permanent atrial fibrillation Type 2 diabetes Chronic kidney disease stage III Lymphedema Hypertension Hyperlipidemia Osteoarthritis  Labs:  Sodium 130, potassium 2.8, chloride 83, CO2 33, BUN 63, creatinine 1.57, BNP 154  Medications:  Furosemide 20 mg IV twice daily Metoprolol succinate 50 mg daily Potassium chloride 40 mEq daily  Met with patient today in the emergency room.  She states that she had been weighing herself daily and taking her diuretics as ordered every other day.  She noted that her weight was up 5 pounds.  Did not feel that she went over 64 ounces of fluid in a 24-hour period  She states that she had also been using her lymphedema pumps 3 times a day for an hour each time.  She goes on to state that she did not come to her outpatient heart failure clinic appointment due to having so many other appointments.  She was given an appointment to be seen on October 18 at 12:30 in the afternoon.   Pricilla Riffle RN [facility]

## 2021-07-28 ENCOUNTER — Inpatient Hospital Stay: Payer: Medicare PPO

## 2021-07-28 DIAGNOSIS — I5033 Acute on chronic diastolic (congestive) heart failure: Secondary | ICD-10-CM | POA: Diagnosis not present

## 2021-07-28 LAB — CBC WITH DIFFERENTIAL/PLATELET
Abs Immature Granulocytes: 0.03 10*3/uL (ref 0.00–0.07)
Basophils Absolute: 0.1 10*3/uL (ref 0.0–0.1)
Basophils Relative: 1 %
Eosinophils Absolute: 0.2 10*3/uL (ref 0.0–0.5)
Eosinophils Relative: 2 %
HCT: 42.2 % (ref 36.0–46.0)
Hemoglobin: 13.2 g/dL (ref 12.0–15.0)
Immature Granulocytes: 1 %
Lymphocytes Relative: 10 %
Lymphs Abs: 0.6 10*3/uL — ABNORMAL LOW (ref 0.7–4.0)
MCH: 23.7 pg — ABNORMAL LOW (ref 26.0–34.0)
MCHC: 31.3 g/dL (ref 30.0–36.0)
MCV: 75.8 fL — ABNORMAL LOW (ref 80.0–100.0)
Monocytes Absolute: 0.8 10*3/uL (ref 0.1–1.0)
Monocytes Relative: 12 %
Neutro Abs: 4.8 10*3/uL (ref 1.7–7.7)
Neutrophils Relative %: 74 %
Platelets: 354 10*3/uL (ref 150–400)
RBC: 5.57 MIL/uL — ABNORMAL HIGH (ref 3.87–5.11)
RDW: 18.8 % — ABNORMAL HIGH (ref 11.5–15.5)
WBC: 6.4 10*3/uL (ref 4.0–10.5)
nRBC: 0 % (ref 0.0–0.2)

## 2021-07-28 LAB — GLUCOSE, PLEURAL OR PERITONEAL FLUID: Glucose, Fluid: 168 mg/dL

## 2021-07-28 LAB — BASIC METABOLIC PANEL
Anion gap: 14 (ref 5–15)
Anion gap: 9 (ref 5–15)
BUN: 59 mg/dL — ABNORMAL HIGH (ref 8–23)
BUN: 55 mg/dL — ABNORMAL HIGH (ref 8–23)
CO2: 33 mmol/L — ABNORMAL HIGH (ref 22–32)
CO2: 32 mmol/L (ref 22–32)
Calcium: 9.1 mg/dL (ref 8.9–10.3)
Calcium: 9.3 mg/dL (ref 8.9–10.3)
Chloride: 82 mmol/L — ABNORMAL LOW (ref 98–111)
Chloride: 86 mmol/L — ABNORMAL LOW (ref 98–111)
Creatinine, Ser: 1.68 mg/dL — ABNORMAL HIGH (ref 0.44–1.00)
Creatinine, Ser: 1.56 mg/dL — ABNORMAL HIGH (ref 0.44–1.00)
GFR, Estimated: 32 mL/min — ABNORMAL LOW (ref >60)
GFR, Estimated: 34 mL/min — ABNORMAL LOW (ref >60)
Glucose, Bld: 138 mg/dL — ABNORMAL HIGH (ref 70–99)
Glucose, Bld: 229 mg/dL — ABNORMAL HIGH (ref 70–99)
Potassium: 2.8 mmol/L — ABNORMAL LOW (ref 3.5–5.1)
Potassium: 3.5 mmol/L (ref 3.5–5.1)
Sodium: 129 mmol/L — ABNORMAL LOW (ref 135–145)
Sodium: 127 mmol/L — ABNORMAL LOW (ref 135–145)

## 2021-07-28 LAB — BODY FLUID CELL COUNT WITH DIFFERENTIAL
Eos, Fluid: 0 %
Lymphs, Fluid: 23 %
Monocyte-Macrophage-Serous Fluid: 53 %
Neutrophil Count, Fluid: 24 %
Other Cells, Fluid: 0 %
Total Nucleated Cell Count, Fluid: 672 cu mm

## 2021-07-28 LAB — GLUCOSE, CAPILLARY: Glucose-Capillary: 202 mg/dL — ABNORMAL HIGH (ref 70–99)

## 2021-07-28 MED ORDER — SPIRONOLACTONE 25 MG PO TABS
50.0000 mg | ORAL_TABLET | Freq: Every day | ORAL | Status: DC
  Administered 2021-07-28: 50 mg via ORAL
  Filled 2021-07-28: qty 2

## 2021-07-28 MED ORDER — POTASSIUM CHLORIDE 10 MEQ/100ML IV SOLN
10.0000 meq | INTRAVENOUS | Status: AC
  Administered 2021-07-28 – 2021-07-29 (×4): 10 meq via INTRAVENOUS
  Filled 2021-07-28 (×4): qty 100

## 2021-07-28 MED ORDER — SODIUM CHLORIDE 1 G PO TABS
1.0000 g | ORAL_TABLET | Freq: Three times a day (TID) | ORAL | Status: DC
  Administered 2021-07-28 – 2021-07-29 (×3): 1 g via ORAL
  Filled 2021-07-28 (×4): qty 1

## 2021-07-28 MED ORDER — METOPROLOL SUCCINATE ER 25 MG PO TB24
25.0000 mg | ORAL_TABLET | Freq: Every day | ORAL | Status: DC
  Administered 2021-07-29: 25 mg via ORAL
  Filled 2021-07-28: qty 1

## 2021-07-28 MED ORDER — MIDODRINE HCL 5 MG PO TABS
5.0000 mg | ORAL_TABLET | Freq: Three times a day (TID) | ORAL | Status: DC
  Administered 2021-07-28 – 2021-07-29 (×2): 5 mg via ORAL
  Filled 2021-07-28 (×2): qty 1

## 2021-07-28 NOTE — Progress Notes (Signed)
PROGRESS NOTE  Karen Farley WVP:[id number] DOB: [birth year] DOA: 07/26/2021 PCP: Valerie Roys, DO  Brief History    Karen Farley is a 75 y.o. female with medical history significant for HFpEF, permanent atrial fibrillation on Eliquis, PAD, type 2 diabetes, CKD stage IIIb, lymphedema who presents with concerns of increasing shortness of breath and edema.   Patient notes that since her last hospitalization in August she has been having increasing abdominal distention.  Had increasing lower extremity edema on top of her feet for the past 2 weeks.  She also notes worsening dyspnea on exertion for the past several weeks and had chest x-ray with PCP showing recurrence of left-sided pleural effusion.  She was in the process of being referred to pulmonology for thoracentesis but could not wait for the appointment due to worsening symptoms and decided to present to the ED.   Patient last admitted on 7/28-8/7 with Acute on chronic diastolic CHF, left-sided pleural effusion and acute on chronic CKD.  Both cardiology and nephrology was consulted and she was treated with IV diuretic. Had thoracentesis on 8/1 with 400 cc removed likely transudative fluid based on limited lab work  she had metolazone, torsemide and Aldactone held at discharge. She had follow-up with nephrology on 8/11 and had restarted torsemide 20 mg 3 times per week and metolazone 2.5 mg once a week.  Later switched Torsemide every other day since she had discussion with nephrology that it would help make it easier for  her. States she still has good urine output with changes in regimen.  However she has been monitoring her weight daily and has noted a 5lb  weight gain from 218 to 223lb since August. Last dose of Torsemide yesterday.    ED Course: She was afebrile, hypotensive down to 84/62 initially which improved with 500 cc normal saline fluid.  Sodium of 128, K of 2.6, chloride of 79, creatinine of 1.78 around her baseline, BG  195.  Magnesium of 2.2.  BNP of 154.  Normal AST and ALT.  T bili mildly elevated at 2.6. Chest x-ray showing large left-sided pleural effusion.  Pulmonology consulted for effusion. Patient has gone for thoracentesis with removal of 600 cc of fluid which will be sent for study.   Consultants  None  Procedures  None  Antibiotics   Anti-infectives (From admission, onward)    None        Subjective  The patient is sitting up at bedside. No new complaints.   Objective   Vitals:  Vitals:   07/28/21 1331 07/28/21 1344  BP: 104/61 (!) 117/102  Pulse:    Resp:    Temp:    SpO2: 98% 97%    Exam:  Constitutional:  Awake, alert, and oriented x 3.  Respiratory:  CTA bilaterally, no w/r/r.  Respiratory effort normal. No retractions or accessory muscle use Diminished breath sounds and dullness to percussion on right base. Cardiovascular:  RRR, no m/r/g No LE extremity edema   Normal pedal pulses Abdomen:  Abdomen appears normal; no tenderness or masses No hernias No HSM Musculoskeletal:  Digits/nails BUE: no clubbing, cyanosis, petechiae, infection exam of joints, bones, muscles of at least one of following: head/neck, RUE, LUE, RLE, LLE   Skin:  No rashes, lesions, ulcers palpation of skin: no induration or nodules Neurologic:  CN 2-12 intact Sensation all 4 extremities intact Psychiatric:  Mental status Mood, affect appropriate Orientation to person, place, time  judgment and insight appear intact  I have personally reviewed the following:   Today's Data  Vitals  Lab Data  CBC, BMP  Imaging  CXR  Cardiology Data  EKG  Scheduled Meds:  furosemide  20 mg Intravenous BID   latanoprost  1 drop Both Eyes QHS   metoprolol succinate  50 mg Oral Daily   midodrine  5 mg Oral TID WC   potassium chloride  40 mEq Oral Daily   sodium chloride  1 g Oral TID WC   spironolactone  50 mg Oral Daily   Continuous Infusions:  Principal Problem:   Acute  on chronic diastolic CHF (congestive heart failure) (HCC) Active Problems:   Type 2 diabetes mellitus with renal complication (HCC)   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 diabetes mellitus (HCC)   Atrial fibrillation (HCC)   Hypotension   Pleural effusion   LOS: 2 days  Acute on chronic diastolic CHF Anasarca -Pt on torsemide 20 mg 3 times per week and metolazone 2.5 mg once a week here with large pleural effusion, anasarca and increasing LE edema -Very complex heart failure and CKD patient. Her creatinine has been sensitive to increasing diuresis in the past. Also very susceptible to hypovolemia and hypotension to increasing diuresis.  -needs cardiology and nephrology consult. There was discussion of referring for placement of CardioMEMS by cardiology in the past which needs to be re-addressed.  -recent echo on 04/2021 with EF of 60-65%  -start IV Lasix 40 mg IV bid, but diuresis is limited by hypotension. Patient may require midodrine to bring pressures up so that more effective diuresis may be obtained.  Consult nephrology for help with diuresis.   Left pleural effusion secondary to diastolic CHF Consult pulm for thoracentesis if unable to diurese off. 600 cc removed today. Going for studies transudative vs exudative.   Hypotension BP low normal. Monitor. This is limiting the ability to diurese this patient. Will consider midodrine and will consult nephrology for assistance with diuresis.   Hypokalemia -Likely due to diuretics - Daily oral supplementation   Hyponatremia Sodium of 128 likely from diuresis improved to 130.   CKD stage 3b -creatinine stable at 1.68 close to baseline around 1.4-1.7   Permanent atrial fibrillation Last Eliquis dose this morning.  Held   Type 2 diabetes Diet controlled   I have seen and examined this patient myself. I have spent 36 minutes in her evaluation and care.   DVT prophylaxis:.on Eliquis Code Status: Full Family Communication: Plan discussed  with patient and husband at bedside  disposition Plan: Home with at least 2 midnight stays  Consults called:  Admission status: inpatient   Level of care: Progressive Cardiac   Status is: Inpatient   Remains inpatient appropriate because:Inpatient level of care appropriate due to severity of illness   Dispo: The patient is from: Home              Anticipated d/c is to: Home              Patient currently is not medically stable to d/c.              Difficult to place patient No   Wilna Pennie, DO [facility] 07/28/2021 2:40

## 2021-07-28 NOTE — Procedures (Signed)
Pre procedural Dx: Symptomatic pleural effusion Post procedural Dx: Same  Successful US guided left sided thoracentesis yielding 600 cc of serous pleural fluid.   Samples sent to lab for analysis.  EBL: None Complications: None immediate.  Ronny Bacon, MD Pager #: [phone]

## 2021-07-28 NOTE — Consult Note (Signed)
Pulmonary Medicine          Date: 07/28/2021,   MRN# [id number] Karen Farley [birth year]     AdmissionWeight: 105.7 kg                 CurrentWeight: 102 kg   Referring physician: Dr. Benny Lennert   CHIEF COMPLAINT:   Acute hypoxemic respiratory failure secondary to bilateral pleural effusions   HISTORY OF PRESENT ILLNESS   This is a pleasant 75 year old female with a medical history of diastolic CHF, atrial fibrillation on Eliquis, increased shortness of breath and edema on arrival.  Patient was recently hospitalized and relates that after discharge had worsening abdominal girth and lower extremity edema specifically over the last 2 weeks with notable pitting.  On arrival she had chest imaging done with x-ray showing left-sided pleural effusion.  She was referred to pulmonary in outpatient but clinically deteriorated and wanted to be seen prior to her appointment so came to hospital due to this reason.  She does have CKD and has been seen by nephrology on the last admission.  Was placed on torsemide metolazone and Aldactone at that time.  She was able to see nephrology on outpatient and this was reduced to 20 mg 3 times weekly.  On admission she had hypotension which was fluid responsive with an AKI on CKD chest x-ray again showing large left-sided pleural effusion.  PCCM consultation due to acute hypoxemic respiratory failure with pleural effusions.   PAST MEDICAL HISTORY   Past Medical History:  Diagnosis Date   Chronic heart failure with preserved ejection fraction (HFpEF) ([place])    a. 11/2019 Echo: EF 60-65%, no rwma, Nl RV size/fxn. Mod dil LA.   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)    DDD (degenerative disc disease), lumbar    Degenerative disc disease, lumbar    bulging and dengerated   Diabetes mellitus without complication (HCC)    Hyperlipidemia    Hypertension    Lymphedema    Morbid obesity (HCC)    Osteoarthritis of both knees    Osteopenia       SURGICAL HISTORY   Past Surgical History:  Procedure Laterality Date   CHOLECYSTECTOMY     DILATION AND CURETTAGE OF UTERUS     TEAR DUCT PROBING WITH STRABISMUS REPAIR Right    TONSILLECTOMY       FAMILY HISTORY   Family History  Problem Relation Age of Onset   Heart disease Mother        CHF   Osteoporosis Mother    Breast cancer Mother    Heart disease Father 34   Cancer Brother        Colon CA- 2002- Youngest brother   Parkinson's disease Brother        Younger Brother   Heart disease Brother        2 brothers   Cancer Brother    Breast cancer Maternal Grandmother      SOCIAL HISTORY   Social History   Tobacco Use   Smoking status: Never   Smokeless tobacco: Never  Vaping Use   Vaping Use: Never used  Substance Use Topics   Alcohol use: Not Currently    Alcohol/week: 0.0 standard drinks    Comment: on rare occasion   Drug use: No     MEDICATIONS    Home Medication:    Current Medication:  Current Facility-Administered Medications:    furosemide (LASIX) injection 20 mg, 20  mg, Intravenous, BID, Tu, Ching T, DO, 20 mg at 07/27/21 1840   latanoprost (XALATAN) 0.005 % ophthalmic solution 1 drop, 1 drop, Both Eyes, QHS, Tu, Ching T, DO, 1 drop at 07/27/21 2335   metoprolol succinate (TOPROL-XL) 24 hr tablet 50 mg, 50 mg, Oral, Daily, Tu, Ching T, DO   potassium chloride SA (KLOR-CON) CR tablet 40 mEq, 40 mEq, Oral, Daily, Tu, Ching T, DO, 40 mEq at 07/28/21 1003    ALLERGIES   Patient has no known allergies.     REVIEW OF SYSTEMS    Review of Systems:  Gen:  Denies  fever, sweats, chills weigh loss  HEENT: Denies blurred vision, double vision, ear pain, eye pain, hearing loss, nose bleeds, sore throat Cardiac:  No dizziness, chest pain or heaviness, chest tightness,edema Resp:   Denies cough or sputum porduction, shortness of breath,wheezing, hemoptysis,  Gi: Denies swallowing difficulty, stomach pain, nausea or vomiting, diarrhea,  constipation, bowel incontinence Gu:  Denies bladder incontinence, burning urine Ext:   Denies Joint pain, stiffness or swelling Skin: Denies  skin rash, easy bruising or bleeding or hives Endoc:  Denies polyuria, polydipsia , polyphagia or weight change Psych:   Denies depression, insomnia or hallucinations   Other:  All other systems negative   VS: BP 97/70 (BP Location: Right Arm)   Pulse 85   Temp 97.9 F (36.6 C)   Resp 18   Ht 5\' 2"  (1.575 m)   Wt 102 kg   SpO2 98%   BMI 41.13 kg/m      PHYSICAL EXAM    GENERAL:NAD, no fevers, chills, no weakness no fatigue HEAD: Normocephalic, atraumatic.  EYES: Pupils equal, round, reactive to light. Extraocular muscles intact. No scleral icterus.  MOUTH: Moist mucosal membrane. Dentition intact. No abscess noted.  EAR, NOSE, THROAT: Clear without exudates. No external lesions.  NECK: Supple. No thyromegaly. No nodules. No JVD.  PULMONARY: Diffuse coarse rhonchi right sided +wheezes CARDIOVASCULAR: S1 and S2. Regular rate and rhythm. No murmurs, rubs, or gallops. No edema. Pedal pulses 2+ bilaterally.  GASTROINTESTINAL: Soft, nontender, nondistended. No masses. Positive bowel sounds. No hepatosplenomegaly.  MUSCULOSKELETAL: No swelling, clubbing, or edema. Range of motion full in all extremities.  NEUROLOGIC: Cranial nerves II through XII are intact. No gross focal neurological deficits. Sensation intact. Reflexes intact.  SKIN: No ulceration, lesions, rashes, or cyanosis. Skin warm and dry. Turgor intact.  PSYCHIATRIC: Mood, affect within normal limits. The patient is awake, alert and oriented x 3. Insight, judgment intact.       IMAGING    DG Chest 2 View  Result Date: 07/20/2021 CLINICAL DATA:  Patient is here to follow up for pleural effusion. Patient complains of cough since April and has fluid retention. Patient denies asthma and is a non smoker, but has a history of a A-fib and has no other lung/heart issues or  surgeries. EXAM: CHEST - 2 VIEW COMPARISON:  Radiography of the chest dated 05/21/2021. Radiography of the chest dated 05/20/2021. FINDINGS: Persistent enlargement of the cardiac silhouette. Atherosclerotic calcification of the aortic knob. Moderate-large left-sided pleural effusion with left basilar atelectasis. Right lung is clear. No pneumothorax. IMPRESSION: Persistent moderate-large left-sided pleural effusion with left basilar atelectasis. Electronically Signed   By: Davina Poke D.O.   On: 07/17/2021 11:03   DG Chest Port 1 View  Result Date: 07/28/2021 CLINICAL DATA:  75 year old female with history of increasing shortness of breath with low oxygen saturations. EXAM: PORTABLE CHEST 1 VIEW COMPARISON:  Chest  x-ray 07/26/2021. FINDINGS: Persistent opacification throughout the left mid to lower hemithorax. Right lung is clear. No definite right pleural effusion. No pneumothorax. Cardiac silhouette is obscured. The patient is rotated to the left on today's exam, resulting in distortion of the mediastinal contours and reduced diagnostic sensitivity and specificity for mediastinal pathology. IMPRESSION: 1. Persistent opacification in the left mid to lower hemithorax compatible with areas of atelectasis and/or consolidation, likely with superimposed large left pleural effusion. 2. Aortic atherosclerosis. Electronically Signed   By: Vinnie Langton M.D.   On: 07/28/2021 10:32   DG Chest Portable 1 View  Result Date: 07/26/2021 CLINICAL DATA:  Shortness of breath EXAM: PORTABLE CHEST 1 VIEW COMPARISON:  07/16/2021 FINDINGS: Large left-sided pleural effusion slightly increased. Probable trace right effusion. Cardiomegaly with vascular congestion. Persistent consolidation in the left lower lung. IMPRESSION: Large left-sided pleural effusion with airspace disease in the left mid to lower lung. Trace right pleural effusion. Cardiomegaly with vascular congestion Electronically Signed   By: Donavan Foil M.D.    On: 07/26/2021 18:10      ASSESSMENT/PLAN   Large left pleural effusion -likely due to CHF/CKD - patient slow to diurese - will order thoracentesis with fluid studies.  Previously patient had thoracentesis and shares she felt better immediately.  -serial CXR  -will reviewe fluid studies post thoracentesis, she does not appear acutely toxic from pneumonia I dought this will be empyema but it could be exudate due to ongoing diuresis  CKD   Nephrology to guide appropriate diuresis    Thank you for allowing me to participate in the care of this patient.  Total face to face encounter time for this patient visit was >45 min. >50% of the time was  spent in counseling and coordination of care.   Patient/Family are satisfied with care plan and all questions have been answered.  This document was prepared using Dragon voice recognition software and may include unintentional dictation errors.     Ottie Glazier, M.D.  Division of [place]

## 2021-07-29 ENCOUNTER — Encounter: Payer: Self-pay | Admitting: Family Medicine

## 2021-07-29 DIAGNOSIS — I5033 Acute on chronic diastolic (congestive) heart failure: Secondary | ICD-10-CM | POA: Diagnosis not present

## 2021-07-29 LAB — BASIC METABOLIC PANEL
Anion gap: 10 (ref 5–15)
BUN: 51 mg/dL — ABNORMAL HIGH (ref 8–23)
CO2: 32 mmol/L (ref 22–32)
Calcium: 9.3 mg/dL (ref 8.9–10.3)
Chloride: 87 mmol/L — ABNORMAL LOW (ref 98–111)
Creatinine, Ser: 1.52 mg/dL — ABNORMAL HIGH (ref 0.44–1.00)
GFR, Estimated: 36 mL/min — ABNORMAL LOW (ref >60)
Glucose, Bld: 152 mg/dL — ABNORMAL HIGH (ref 70–99)
Potassium: 3.5 mmol/L (ref 3.5–5.1)
Sodium: 129 mmol/L — ABNORMAL LOW (ref 135–145)

## 2021-07-29 LAB — GLUCOSE, CAPILLARY: Glucose-Capillary: 172 mg/dL — ABNORMAL HIGH (ref 70–99)

## 2021-07-29 MED ORDER — MIDODRINE HCL 5 MG PO TABS
10.0000 mg | ORAL_TABLET | Freq: Three times a day (TID) | ORAL | Status: DC
  Administered 2021-07-29 – 2021-08-03 (×15): 10 mg via ORAL
  Filled 2021-07-29 (×15): qty 2

## 2021-07-29 NOTE — Progress Notes (Signed)
Pulmonary Medicine          Date: 07/29/2021,   MRN# [id number] Karen Farley 04-08-1946     AdmissionWeight: 105.7 kg                 CurrentWeight: 103.4 kg   Referring physician: Dr. Benny Lennert   CHIEF COMPLAINT:   Acute hypoxemic respiratory failure secondary to bilateral pleural effusions   HISTORY OF PRESENT ILLNESS   This is a pleasant 75 year old female with a medical history of diastolic CHF, atrial fibrillation on Eliquis, increased shortness of breath and edema on arrival.  Patient was recently hospitalized and relates that after discharge had worsening abdominal girth and lower extremity edema specifically over the last 2 weeks with notable pitting.  On arrival she had chest imaging done with x-ray showing left-sided pleural effusion.  She was referred to pulmonary in outpatient but clinically deteriorated and wanted to be seen prior to her appointment so came to hospital due to this reason.  She does have CKD and has been seen by nephrology on the last admission.  Was placed on torsemide metolazone and Aldactone at that time.  She was able to see nephrology on outpatient and this was reduced to 20 mg 3 times weekly.  On admission she had hypotension which was fluid responsive with an AKI on CKD chest x-ray again showing large left-sided pleural effusion.  PCCM consultation due to acute hypoxemic respiratory failure with pleural effusions.  07/29/21- patient improved post thoracentesis, she did PT today  PAST MEDICAL HISTORY   Past Medical History:  Diagnosis Date   Chronic heart failure with preserved ejection fraction (HFpEF) ([facility])    a. 11/2019 Echo: EF 60-65%, no rwma, Nl RV size/fxn. Mod dil LA.   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)    DDD (degenerative disc disease), lumbar    Degenerative disc disease, lumbar    bulging and dengerated   Diabetes mellitus without complication ([facility]CC)    Hyperlipidemia    Hypertension    Lymphedema     Morbid obesity (HCC)    Osteoarthritis of both knees    Osteopenia      SURGICAL HISTORY   Past Surgical History:  Procedure Laterality Date   CHOLECYSTECTOMY     DILATION AND CURETTAGE OF UTERUS     TEAR DUCT PROBING WITH STRABISMUS REPAIR Right    TONSILLECTOMY       FAMILY HISTORY   Family History  Problem Relation Age of Onset   Heart disease Mother        CHF   Osteoporosis Mother    Breast cancer Mother    Heart disease Father 67   Cancer Brother        Colon CA- 2002- Youngest brother   Parkinson's disease Brother        Younger Brother   Heart disease Brother        2 brothers   Cancer Brother    Breast cancer Maternal Grandmother      SOCIAL HISTORY   Social History   Tobacco Use   Smoking status: Never   Smokeless tobacco: Never  Vaping Use   Vaping Use: Never used  Substance Use Topics   Alcohol use: Not Currently    Alcohol/week: 0.0 standard drinks    Comment: on rare occasion   Drug use: No     MEDICATIONS    Home Medication:    Current Medication:  Current Facility-Administered Medications:  furosemide (LASIX) injection 20 mg, 20 mg, Intravenous, BID, Swayze, Ava, DO, 20 mg at 07/29/21 0928   latanoprost (XALATAN) 0.005 % ophthalmic solution 1 drop, 1 drop, Both Eyes, QHS, Tu, Ching T, DO, 1 drop at 07/28/21 2234   metoprolol succinate (TOPROL-XL) 24 hr tablet 25 mg, 25 mg, Oral, Daily, Swayze, Ava, DO, 25 mg at 07/29/21 0928   midodrine (PROAMATINE) tablet 5 mg, 5 mg, Oral, TID WC, Alysia Scism, MD, 5 mg at 07/29/21 0928   potassium chloride SA (KLOR-CON) CR tablet 40 mEq, 40 mEq, Oral, Daily, Tu, Ching T, DO, 40 mEq at 07/29/21 7619   sodium chloride tablet 1 g, 1 g, Oral, TID WC, Lanney Gins, Quintasia Theroux, MD, 1 g at 07/29/21 5093    ALLERGIES   Patient has no known allergies.     REVIEW OF SYSTEMS    Review of Systems:  Gen:  Denies  fever, sweats, chills weigh loss  HEENT: Denies blurred vision, double vision, ear  pain, eye pain, hearing loss, nose bleeds, sore throat Cardiac:  No dizziness, chest pain or heaviness, chest tightness,edema Resp:   Denies cough or sputum porduction, shortness of breath,wheezing, hemoptysis,  Gi: Denies swallowing difficulty, stomach pain, nausea or vomiting, diarrhea, constipation, bowel incontinence Gu:  Denies bladder incontinence, burning urine Ext:   Denies Joint pain, stiffness or swelling Skin: Denies  skin rash, easy bruising or bleeding or hives Endoc:  Denies polyuria, polydipsia , polyphagia or weight change Psych:   Denies depression, insomnia or hallucinations   Other:  All other systems negative   VS: BP 100/60 (BP Location: Right Arm)   Pulse 96   Temp 97.9 F (36.6 C) (Axillary)   Resp 19   Ht 5\' 2"  (1.575 m)   Wt 103.4 kg   SpO2 94%   BMI 41.68 kg/m      PHYSICAL EXAM    GENERAL:NAD, no fevers, chills, no weakness no fatigue HEAD: Normocephalic, atraumatic.  EYES: Pupils equal, round, reactive to light. Extraocular muscles intact. No scleral icterus.  MOUTH: Moist mucosal membrane. Dentition intact. No abscess noted.  EAR, NOSE, THROAT: Clear without exudates. No external lesions.  NECK: Supple. No thyromegaly. No nodules. No JVD.  PULMONARY: Diffuse coarse rhonchi right sided +wheezes CARDIOVASCULAR: S1 and S2. Regular rate and rhythm. No murmurs, rubs, or gallops. No edema. Pedal pulses 2+ bilaterally.  GASTROINTESTINAL: Soft, nontender, nondistended. No masses. Positive bowel sounds. No hepatosplenomegaly.  MUSCULOSKELETAL: No swelling, clubbing, or edema. Range of motion full in all extremities.  NEUROLOGIC: Cranial nerves II through XII are intact. No gross focal neurological deficits. Sensation intact. Reflexes intact.  SKIN: No ulceration, lesions, rashes, or cyanosis. Skin warm and dry. Turgor intact.  PSYCHIATRIC: Mood, affect within normal limits. The patient is awake, alert and oriented x 3. Insight, judgment intact.        IMAGING    DG Chest 2 View  Result Date: 07/20/2021 CLINICAL DATA:  Patient is here to follow up for pleural effusion. Patient complains of cough since April and has fluid retention. Patient denies asthma and is a non smoker, but has a history of a A-fib and has no other lung/heart issues or surgeries. EXAM: CHEST - 2 VIEW COMPARISON:  Radiography of the chest dated 05/21/2021. Radiography of the chest dated 05/20/2021. FINDINGS: Persistent enlargement of the cardiac silhouette. Atherosclerotic calcification of the aortic knob. Moderate-large left-sided pleural effusion with left basilar atelectasis. Right lung is clear. No pneumothorax. IMPRESSION: Persistent moderate-large left-sided pleural effusion with left basilar  atelectasis. Electronically Signed   By: Davina Poke D.O.   On: 07/17/2021 11:03   DG Chest Port 1 View  Result Date: 07/28/2021 CLINICAL DATA:  Post left-sided thoracentesis. EXAM: PORTABLE CHEST 1 VIEW COMPARISON:  Earlier same day; 07/26/2021 FINDINGS: Grossly unchanged enlarged cardiac silhouette and mediastinal contours with atherosclerotic plaque within the aortic arch. Interval reduction in persistent trace left-sided effusion post thoracentesis. Improved aeration of left lung base with persistent partial obscuration of the left heart border secondary to left basilar heterogeneous/consolidative opacities. The right hemithorax remains well aerated. Mild point is congestion without frank evidence of edema. No pneumothorax. No acute osseous abnormalities. IMPRESSION: 1. No evidence of complication following left-sided thoracentesis. Specifically, no pneumothorax. 2. Improved aeration of left lung base with persistent left basilar opacities, atelectasis versus infiltrate. 3. Otherwise, similar findings of cardiomegaly and pulmonary venous congestion without frank evidence of edema. 4.  Aortic Atherosclerosis (ICD10-I70.0). Electronically Signed   By: Sandi Mariscal M.D.   On:  07/28/2021 14:04   DG Chest Port 1 View  Result Date: 07/28/2021 CLINICAL DATA:  75 year old female with history of increasing shortness of breath with low oxygen saturations. EXAM: PORTABLE CHEST 1 VIEW COMPARISON:  Chest x-ray 07/26/2021. FINDINGS: Persistent opacification throughout the left mid to lower hemithorax. Right lung is clear. No definite right pleural effusion. No pneumothorax. Cardiac silhouette is obscured. The patient is rotated to the left on today's exam, resulting in distortion of the mediastinal contours and reduced diagnostic sensitivity and specificity for mediastinal pathology. IMPRESSION: 1. Persistent opacification in the left mid to lower hemithorax compatible with areas of atelectasis and/or consolidation, likely with superimposed large left pleural effusion. 2. Aortic atherosclerosis. Electronically Signed   By: Vinnie Langton M.D.   On: 07/28/2021 10:32   DG Chest Portable 1 View  Result Date: 07/26/2021 CLINICAL DATA:  Shortness of breath EXAM: PORTABLE CHEST 1 VIEW COMPARISON:  07/16/2021 FINDINGS: Large left-sided pleural effusion slightly increased. Probable trace right effusion. Cardiomegaly with vascular congestion. Persistent consolidation in the left lower lung. IMPRESSION: Large left-sided pleural effusion with airspace disease in the left mid to lower lung. Trace right pleural effusion. Cardiomegaly with vascular congestion Electronically Signed   By: Donavan Foil M.D.   On: 07/26/2021 18:10   US THORACENTESIS ASP PLEURAL SPACE W/IMG GUIDE  Result Date: 07/28/2021 INDICATION: Symptomatic left-sided pleural effusion. Please from ultrasound-guided thoracentesis for diagnostic and therapeutic purposes. EXAM: US THORACENTESIS ASP PLEURAL SPACE W/IMG GUIDE COMPARISON:  Chest radiograph-earlier same day; 05/20/2021; ultrasound-guided left-sided thoracentesis yielding 400 cc pleural fluid. MEDICATIONS: None. COMPLICATIONS: None immediate. TECHNIQUE: Informed written  consent was obtained from the patient after a discussion of the risks, benefits and alternatives to treatment. A timeout was performed prior to the initiation of the procedure. Initial ultrasound scanning demonstrates a small anechoic left-sided pleural effusion. The lower chest was prepped and draped in the usual sterile fashion. 1% lidocaine was used for local anesthesia. Under direct ultrasound guidance, an 8 Fr Safe-T-Centesis catheter was introduced. The thoracentesis was performed. The catheter was removed and a dressing was applied. The patient tolerated the procedure well without immediate post procedural complication. The patient was escorted to have an upright chest radiograph. FINDINGS: A total of approximately 600 cc of serous fluid was removed. Requested samples were sent to the laboratory. IMPRESSION: Successful ultrasound-guided left sided thoracentesis yielding 600 cc of pleural fluid. Electronically Signed   By: Sandi Mariscal M.D.   On: 07/28/2021 14:06      ASSESSMENT/PLAN  Large left pleural effusion -likely due to CHF/CKD - patient slow to diurese - will order thoracentesis with fluid studies.  Previously patient had thoracentesis and shares she felt better immediately.  -serial CXR  -will reviewe fluid studies post thoracentesis, she does not appear acutely toxic from pneumonia I dought this will be empyema but it could be exudate due to ongoing diuresis  CKD   Nephrology to guide appropriate diuresis    Thank you for allowing me to participate in the care of this patient.  Total face to face encounter time for this patient visit was >45 min. >50% of the time was  spent in counseling and coordination of care.   Patient/Family are satisfied with care plan and all questions have been answered.  This document was prepared using Dragon voice recognition software and may include unintentional dictation errors.     Ottie Glazier, M.D.  Div[facility]

## 2021-07-29 NOTE — Evaluation (Signed)
Physical Therapy Evaluation Patient Details Name: Karen Farley MRN: [id number] DOB: 08/01/1946 Today's Date: 07/29/2021  History of Present Illness  Pt is a 75 y/o F admitted on 07/26/21 after presenting with c/o increasing SOB & edema. Pt reports she had chest x-ray with PCP showing recurrence of left-sided pleural effusion was in the process of being referred to pulmonology for thoracentesis but could not wait for the appointment due to worsening symptoms. Pulmonology was consulted & pt had thoracentesis with removal of 600 cc of fluid. Pt is being treated for Acute on chronic diastolic CHF &  Anasarca. PMH: HFpEF, permanent A-fib on eliquis, PAD, DM2, CKD stage 3B, lymphadema  Clinical Impression  Pt seen for PT evaluation with pt requiring encouragement for participation. Pt reports she was independent without AD prior to admission. On this date, when pt ambulates without AD she frequently reaches for objects for support. Provided pt with RW for increased balance. Pt is able to ambulate to the door & recliner with RW but declines further gait or additional gait trials 2/2 fatigue. PT encouraged pt to ambulate with nursing staff throughout the day. Will continue to follow pt acutely to address balance during mobility & endurance.        Recommendations for follow up therapy are one component of a multi-disciplinary discharge planning process, led by the attending physician.  Recommendations may be updated based on patient status, additional functional criteria and insurance authorization.  Follow Up Recommendations Home health PT;Supervision for mobility/OOB    Equipment Recommendations  Rolling walker with 5" wheels    Recommendations for Other Services       Precautions / Restrictions Precautions Precautions: Fall Restrictions Weight Bearing Restrictions: No      Mobility  Bed Mobility Overal bed mobility: Modified Independent             General bed mobility comments:  sidelying>sitting with bed rails    Transfers Overall transfer level: Needs assistance   Transfers: Sit to/from Stand Sit to Stand: Supervision            Ambulation/Gait Ambulation/Gait assistance: Supervision Gait Distance (Feet):  (10 ft without AD + 12 ft with RW) Assistive device: None;Rolling walker (2 wheeled) Gait Pattern/deviations: Decreased step length - right;Decreased step length - left;Decreased stride length Gait velocity: decreased   General Gait Details: Pt initially ambulates without AD but frequently reaches for furniture to hold to with PT educating her on increased safety & balance with RW & PT providing her with AD.  Stairs            Wheelchair Mobility    Modified Rankin (Stroke Patients Only)       Balance Overall balance assessment: Needs assistance Sitting-balance support: Feet supported;No upper extremity supported Sitting balance-Leahy Scale: Good     Standing balance support: No upper extremity supported;During functional activity Standing balance-Leahy Scale: Fair                               Pertinent Vitals/Pain Pain Assessment: Faces Faces Pain Scale: Hurts a little bit Pain Location: BLE Pain Descriptors / Indicators: Discomfort Pain Intervention(s): Monitored during session    Home Living Family/patient expects to be discharged to:: Private residence Living Arrangements: Spouse/significant other Available Help at Discharge: Family;Available 24 hours/day Type of Home: House Home Access: Level entry     Home Layout: One level Home Equipment: Walker - 2 wheels;Cane - single point;Shower seat -  built in      Prior Function Level of Independence: Independent with assistive device(s)         Comments: Pt reports she's ambulatory without AD prior to admission but ambulates household distances.     Hand Dominance        Extremity/Trunk Assessment   Upper Extremity Assessment Upper Extremity  Assessment: Overall WFL for tasks assessed    Lower Extremity Assessment Lower Extremity Assessment: Generalized weakness (BLE edema & erythema in distal part of extremities, blisters forming & pt endorsing pain.)       Communication   Communication: No difficulties  Cognition Arousal/Alertness: Awake/alert Behavior During Therapy: WFL for tasks assessed/performed;Flat affect Overall Cognitive Status: Within Functional Limits for tasks assessed                                        General Comments General comments (skin integrity, edema, etc.): HR >90% on room air    Exercises     Assessment/Plan    PT Assessment Patient needs continued PT services  PT Problem List Decreased strength;Decreased mobility;Decreased safety awareness;Decreased balance;Decreased knowledge of use of DME;Cardiopulmonary status limiting activity;Decreased activity tolerance       PT Treatment Interventions DME instruction;Therapeutic exercise;Gait training;Balance training;Neuromuscular re-education;Stair training;Functional mobility training;Patient/family education;Therapeutic activities    PT Goals (Current goals can be found in the Care Plan section)  Acute Rehab PT Goals Patient Stated Goal: get some rest PT Goal Formulation: With patient Time For Goal Achievement: 08/12/21 Potential to Achieve Goals: Good    Frequency Min 2X/week   Barriers to discharge        Co-evaluation               AM-PAC PT "6 Clicks" Mobility  Outcome Measure Help needed turning from your back to your side while in a flat bed without using bedrails?: None Help needed moving from lying on your back to sitting on the side of a flat bed without using bedrails?: A Little Help needed moving to and from a bed to a chair (including a wheelchair)?: A Little Help needed standing up from a chair using your arms (e.g., wheelchair or bedside chair)?: A Little Help needed to walk in hospital room?:  A Little Help needed climbing 3-5 steps with a railing? : A Lot 6 Click Score: 18    End of Session   Activity Tolerance: Patient limited by fatigue (pt self limits somewhat) Patient left: in chair;with chair alarm set;with call bell/phone within reach Nurse Communication: Mobility status PT Visit Diagnosis: Muscle weakness (generalized) (M62.81);Difficulty in walking, not elsewhere classified (R26.2);Unsteadiness on feet (R26.81)    Time: 3149-7026 PT Time Calculation (min) (ACUTE ONLY): 9 min   Charges:   PT Evaluation $PT Eval Low Complexity: [place], PT, DPT 07/29/21, 2:23 PM   [name] 07/29/2021, 2:22 PM

## 2021-07-29 NOTE — Plan of Care (Signed)

## 2021-07-29 NOTE — Evaluation (Signed)
Occupational Therapy Evaluation Patient Details Name: Karen Farley MRN: [id number] DOB: 1946/06/11 Today's Date: 07/29/2021   History of Present Illness Pt is a 75 y/o F admitted on 07/26/21 after presenting with c/o increasing SOB & edema. Pt reports she had chest x-ray with PCP showing recurrence of left-sided pleural effusion was in the process of being referred to pulmonology for thoracentesis but could not wait for the appointment due to worsening symptoms. Pulmonology was consulted & pt had thoracentesis with removal of 600 cc of fluid. Pt is being treated for acute on chronic diastolic CHF &  anasarca. PMH: HFpEF, permanent A-fib on eliquis, PAD, DM2, CKD stage 3B, lymphadema   Clinical Impression   Pt seen for OT evaluation this date. Upon arrival to room, pt awake sitting upright in bed. Pt initially declining OOB mobility, reporting that she did not sleep well last night, however agreeable following MIN encouragement. At baseline, pt is independent with ADLs and functional mobility of household distances. Pt reports using SPC/RW for community mobility. Most recently, pt was using AD around home d/t SOB and fatigue with minimal activity. Pt's husband assists with donning LE compression devices and IADLs. Pt currently presents with LE edema & pain, decreased balance, and decreased activity tolerance. Due to these functional impairments, pt requires SUPERVISION for functional mobility of household distances with RW, MIN GUARD for toilet transfers/hygiene, and MIN GUARD for standing hand hygiene. Pt would benefit from additional skilled OT services to maximize return to PLOF and minimize risk of future falls, injury, caregiver burden, and readmission. Upon discharge, recommend [facility] services.    Recommendations for follow up therapy are one component of a multi-disciplinary discharge planning process, led by the attending physician.  Recommendations may be updated based on patient status,  additional functional criteria and insurance authorization.   Follow Up Recommendations  Home health OT;Supervision - Intermittent    Equipment Recommendations  None recommended by OT       Precautions / Restrictions Precautions Precautions: Fall Restrictions Weight Bearing Restrictions: No      Mobility Bed Mobility Overal bed mobility: Modified Independent             General bed mobility comments: sidelying>sitting with bed rails    Transfers Overall transfer level: Needs assistance Equipment used: Rolling walker (2 wheeled) Transfers: Sit to/from Stand Sit to Stand: Supervision         General transfer comment: Requires verbal cues for safe hand placement with RW use    Balance Overall balance assessment: Needs assistance Sitting-balance support: Feet supported;No upper extremity supported Sitting balance-Leahy Scale: Good Sitting balance - Comments: Good sitting balance reaching within BOS at EOB   Standing balance support: Single extremity supported;During functional activity Standing balance-Leahy Scale: Fair Standing balance comment: MIN GUARD for standing grooming tasks                           ADL either performed or assessed with clinical judgement   ADL Overall ADL's : Needs assistance/impaired     Grooming: Wash/dry hands;Min guard;Standing Grooming Details (indicate cue type and reason): MIN GUARD d/t pt with decreased activity tolerance, seeking single UE support from sink countertop             Lower Body Dressing: Set up;Sitting/lateral leans Lower Body Dressing Details (indicate cue type and reason): to don/doff slip-on shoes while seated EOB Toilet Transfer: Min guard;Ambulation;Regular Toilet;Grab bars;RW Armed forces technical officer Details (indicate cue type and reason):  Requires verbal cues for safe hand placement with RW use Toileting- Clothing Manipulation and Hygiene: Supervision/safety;Sitting/lateral lean        Functional mobility during ADLs: Supervision/safety;Rolling walker        Pertinent Vitals/Pain Pain Assessment: Faces Faces Pain Scale: Hurts a little bit Pain Location: BLE Pain Descriptors / Indicators: Discomfort Pain Intervention(s): Monitored during session;Limited activity within patient's tolerance     Hand Dominance Right   Extremity/Trunk Assessment Upper Extremity Assessment Upper Extremity Assessment: Generalized weakness   Lower Extremity Assessment Lower Extremity Assessment: Generalized weakness (BLE edema & erythema in distal part of extremities, blisters forming & pt endorsing pain.)       Communication Communication Communication: No difficulties   Cognition Arousal/Alertness: Awake/alert Behavior During Therapy: WFL for tasks assessed/performed;Flat affect Overall Cognitive Status: Within Functional Limits for tasks assessed                                     General Comments  HR 90s-100s and SpO2 > 92% during OOB activity            Home Living Family/patient expects to be discharged to:: Private residence Living Arrangements: Spouse/significant other Available Help at Discharge: Family;Available 24 hours/day Type of Home: House Home Access: Level entry     Home Layout: One level     Bathroom Shower/Tub: Occupational psychologist: Standard     Home Equipment: Environmental consultant - 2 wheels;Cane - single point;Shower seat - built in   Additional Comments: Pt has LE compression devices she uses for LE edema.      Prior Functioning/Environment Level of Independence: Independent with assistive device(s)  Gait / Transfers Assistance Needed: Pt reports that she is typically independent without AD for household mobility. Pt uses SPC/RW for community distances (occassionally uses around the home when fatigued) ADL's / Homemaking Assistance Needed: Pt reports that she is independent with ADLs. Husband assists with donning LE  compression devices and IADLs   Comments: Pt reports she's ambulatory without AD prior to admission but ambulates household distances.        OT Problem List: Decreased strength;Decreased activity tolerance;Impaired balance (sitting and/or standing);Cardiopulmonary status limiting activity      OT Treatment/Interventions: Self-care/ADL training;Therapeutic exercise;Energy conservation;DME and/or AE instruction;Therapeutic activities;Patient/family education;Balance training    OT Goals(Current goals can be found in the care plan section) Acute Rehab OT Goals Patient Stated Goal: to regain independence OT Goal Formulation: With patient Time For Goal Achievement: 08/12/21 ADL Goals Pt Will Perform Grooming: with modified independence;standing Pt Will Perform Lower Body Dressing: with set-up;with supervision;sit to/from stand Pt Will Transfer to Toilet: with modified independence;ambulating;regular height toilet  OT Frequency: Min 2X/week    AM-PAC OT "6 Clicks" Daily Activity     Outcome Measure Help from another person eating meals?: None Help from another person taking care of personal grooming?: A Little Help from another person toileting, which includes using toliet, bedpan, or urinal?: A Little Help from another person bathing (including washing, rinsing, drying)?: A Lot Help from another person to put on and taking off regular upper body clothing?: A Little Help from another person to put on and taking off regular lower body clothing?: A Lot 6 Click Score: 17   End of Session Equipment Utilized During Treatment: Rolling walker Nurse Communication: Mobility status  Activity Tolerance: Patient tolerated treatment well Patient left: in bed;with call bell/phone within reach;with family/visitor present  OT Visit Diagnosis: Unsteadiness on feet (R26.81);Muscle weakness (generalized) (M62.81)                Time: 3846-6599 OT Time Calculation (min): 34 min Charges:  OT General  Charges $OT Visit: 1 Visit OT Evaluation $OT Eval Moderate Complexity: 1 Mod OT Treatments $Self Care/Home Management : 8-22 mins $Therapeutic Activity: 8-22 mins  Fredirick Maudlin, OTR/L [place]

## 2021-07-29 NOTE — Progress Notes (Addendum)
PROGRESS NOTE  Karen Farley:[id number] DOB: 1945/10/24 DOA: 07/26/2021 PCP: Valerie Roys, DO  Brief History    Karen Farley is a 75 y.o. female with medical history significant for HFpEF, permanent atrial fibrillation on Eliquis, PAD, type 2 diabetes, CKD stage IIIb, lymphedema who presents with concerns of increasing shortness of breath and edema.   Patient notes that since her last hospitalization in August she has been having increasing abdominal distention.  Had increasing lower extremity edema on top of her feet for the past 2 weeks.  She also notes worsening dyspnea on exertion for the past several weeks and had chest x-ray with PCP showing recurrence of left-sided pleural effusion.  She was in the process of being referred to pulmonology for thoracentesis but could not wait for the appointment due to worsening symptoms and decided to present to the ED.   Patient last admitted on 7/28-8/7 with Acute on chronic diastolic CHF, left-sided pleural effusion and acute on chronic CKD.  Both cardiology and nephrology was consulted and she was treated with IV diuretic. Had thoracentesis on 8/1 with 400 cc removed likely transudative fluid based on limited lab work  she had metolazone, torsemide and Aldactone held at discharge. She had follow-up with nephrology on 8/11 and had restarted torsemide 20 mg 3 times per week and metolazone 2.5 mg once a week.  Later switched Torsemide every other day since she had discussion with nephrology that it would help make it easier for  her. States she still has good urine output with changes in regimen.  However she has been monitoring her weight daily and has noted a 5lb  weight gain from 218 to 223lb since August. Last dose of Torsemide yesterday.    ED Course: She was afebrile, hypotensive down to 84/62 initially which improved with 500 cc normal saline fluid.  Sodium of 128, K of 2.6, chloride of 79, creatinine of 1.78 around her baseline, BG  195.  Magnesium of 2.2.  BNP of 154.  Normal AST and ALT.  T bili mildly elevated at 2.6. Chest x-ray showing large left-sided pleural effusion.  Pulmonology consulted for effusion. Patient has gone for thoracentesis with removal of 600 cc of fluid which will be sent for study.  The patient is complaining of increasing edema and swelling of lower extremities bilaterally.   Consultants  None  Procedures  None  Antibiotics   Anti-infectives (From admission, onward)    None        Subjective  The patient is sitting up at bedside. Complaining of increasing swelling and edema bilaterally.  Objective   Vitals:  Vitals:   07/29/21 0715 07/29/21 1142  BP: 100/60 111/85  Pulse: 96 97  Resp: 19 18  Temp: 97.9 F (36.6 C) 98.3 F (36.8 C)  SpO2: 94% 95%    Exam:  Constitutional:  Awake, alert, and oriented x 3.  Respiratory:  CTA bilaterally, no w/r/r.  Respiratory effort normal. No retractions or accessory muscle use Diminished breath sounds and dullness to percussion on right base. Cardiovascular:  RRR, no m/r/g Worsening lower extremity edema particularly on the dorsum of feet with erythema and blistering/weeping of lower extremities bilaterally.  Normal pedal pulses Abdomen:  Abdomen appears normal; no tenderness or masses No hernias No HSM Musculoskeletal:  Digits/nails BUE: no clubbing, cyanosis, petechiae, infection exam of joints, bones, muscles of at least one of following: head/neck, RUE, LUE, RLE, LLE   Skin:  No rashes, lesions, ulcers palpation of skin:  no induration or nodules Neurologic:  CN 2-12 intact Sensation all 4 extremities intact Psychiatric:  Mental status Mood, affect appropriate Orientation to person, place, time  judgment and insight appear intact     I have personally reviewed the following:   Today's Data  Vitals  Lab Data  CBC, BMP  Imaging  CXR  Cardiology Data  EKG  Scheduled Meds:  furosemide  20 mg  Intravenous BID   latanoprost  1 drop Both Eyes QHS   metoprolol succinate  25 mg Oral Daily   midodrine  10 mg Oral TID WC   potassium chloride  40 mEq Oral Daily   Continuous Infusions:  Principal Problem:   Acute on chronic diastolic CHF (congestive heart failure) (HCC) Active Problems:   Type 2 diabetes mellitus with renal complication (HCC)   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 diabetes mellitus (HCC)   Atrial fibrillation (HCC)   Hypotension   Pleural effusion   LOS: 3 days  Acute on chronic diastolic CHF Anasarca: Worsening edema of lower extremities bilaterally. -Pt on torsemide 20 mg 3 times per week and metolazone 2.5 mg once a week here with large pleural effusion, anasarca and increasing LE edema -Very complex heart failure and CKD patient. Her creatinine has been sensitive to increasing diuresis in the past. Also very susceptible to hypovolemia and hypotension to increasing diuresis.  -needs cardiology and nephrology consult. There was discussion of referring for placement of CardioMEMS by cardiology in the past which needs to be re-addressed.  -recent echo on 04/2021 with EF of 60-65%  -start IV Lasix 40 mg IV bid, but diuresis is limited by hypotension. Patient may require midodrine to bring pressures up so that more effective diuresis may be obtained.  Consult nephrology for help with diuresis.   Left pleural effusion secondary to diastolic CHF Consult pulm for thoracentesis if unable to diurese off. 600 cc removed today. LIkely transudative.   Hypotension BP low normal. Monitor. This is limiting the ability to diurese this patient. Have increased midodrine to 10 mg tid. Metoprolol XL has been decreased to 25 daily.    Hypokalemia Resolved. Monitor. Likely due to diuretics Daily oral supplementation   Hyponatremia Sodium of 127 likely from diuresis improved to 129. Continue fluid restriction.   CKD stage 3b -creatinine stable at 1.68 close to baseline around 1.4-1.7. I  appreciate nephrology's help.   Permanent atrial fibrillation Last Eliquis dose this morning.  Held   Type 2 diabetes Diet controlled   I have seen and examined this patient myself. I have spent 34 minutes in her evaluation and care.   DVT prophylaxis:.on Eliquis Code Status: Full Family Communication: Plan discussed with patient and husband at bedside  disposition Plan: Home with at least 2 midnight stays  Consults called:  Admission status: inpatient   Level of care: Progressive Cardiac   Status is: Inpatient   Remains inpatient appropriate because:Inpatient level of care appropriate due to severity of illness   Dispo: The patient is from: Home              Anticipated d/c is to: Home              Patient currently is not medically stable to d/c.              Difficult to place patient No   Jaszmine Navejas, DO [facility] 07/29/2021 3:55

## 2021-07-29 NOTE — Progress Notes (Signed)
[facility]  ROUNDING NOTE   Subjective:   Ms. VAANI MORREN was admitted to [facility] on 07/26/2021 for Morbid obesity ([place]) [E66.01] DOE (dyspnea on exertion) [R06.09] Recurrent left pleural effusion [J90] Hypotension [I95.9] Type 2 diabetes mellitus without complication, without long-term current use of insulin ([place]) [E11.9] Atrial fibrillation, unspecified type ([place]) [I48.91] Chronic congestive heart failure, unspecified heart failure type [facility]) [I50.9]  Patient was last seen by my partner, Dr. Candiss Norse, on 05/31/21. Where torsemide was restarted and to continue metolazone.   Patient admitted with shortness of breath and increasing peripheral edema. She was then started on IV furosemide  Left thoracentesis done yesterday yielding 616mL of pleural fluid. Patient states she is breathing better.   Nephrology consulted for hyponatremia, volume management and hypotension.    Objective:  Vital signs in last 24 hours:  Temp:  [97.8 F (36.6 C)-98.3 F (36.8 C)] 98.3 F (36.8 C) (10/09 1142) Pulse Rate:  [80-97] 97 (10/09 1142) Resp:  [18-20] 18 (10/09 1142) BP: (89-135)/(48-107) 111/85 (10/09 1142) SpO2:  [93 %-100 %] 95 % (10/09 1142) Weight:  [103.4 kg] 103.4 kg (10/09 0600)  Weight change: 1.361 kg Filed Weights   07/26/21 1701 07/28/21 0540 07/29/21 0600  Weight: 105.7 kg 102 kg 103.4 kg    Intake/Output: I/O last 3 completed shifts: In: 1872.6 [P.O.:1560; IV Piggyback:312.6] Out: 6578 [Urine:4150]   Intake/Output this shift:  Total I/O In: 724.5 [P.O.:600; IV Piggyback:124.5] Out: 800 [Urine:800]  Physical Exam: General: NAD, sitting up in bed  Head: Normocephalic, atraumatic. Moist oral mucosal membranes  Eyes: Anicteric, PERRL  Neck: Supple, trachea midline  Lungs:  Bilateral crackles  Heart: Regular rate and rhythm  Abdomen:  Soft, nontender,   Extremities:  ++ peripheral edema.  Neurologic: Nonfocal, moving all four extremities  Skin: No  lesions        Basic Metabolic Panel: Recent Labs  Lab 07/26/21 1708 07/27/21 0637 07/27/21 1926 07/28/21 0753 07/28/21 1828 07/29/21 0425  NA 128* 130* 126* 129* 127* 129*  K 2.6* 2.8* 3.5 2.8* 3.5 3.5  CL 79* 83* 82* 82* 86* 87*  CO2 34* 33* 32 33* 32 32  GLUCOSE 195* 147* 201* 138* 229* 152*  BUN 65* 63* 62* 59* 55* 51*  CREATININE 1.78* 1.57* 1.85* 1.68* 1.56* 1.52*  CALCIUM 9.4 9.4 9.1 9.1 9.3 9.3  MG 2.2  --   --   --   --   --     Liver Function Tests: Recent Labs  Lab 07/26/21 1708  AST 27  ALT 16  ALKPHOS 68  BILITOT 2.3*  PROT 6.1*  ALBUMIN 3.2*   No results for input(s): LIPASE, AMYLASE in the last 168 hours. No results for input(s): AMMONIA in the last 168 hours.  CBC: Recent Labs  Lab 07/26/21 1708 07/28/21 0753  WBC 8.1 6.4  NEUTROABS  --  4.8  HGB 13.6 13.2  HCT 42.5 42.2  MCV 77.1* 75.8*  PLT 341 354    Cardiac Enzymes: No results for input(s): CKTOTAL, CKMB, CKMBINDEX, TROPONINI in the last 168 hours.  BNP: Invalid input(s): POCBNP  CBG: Recent Labs  Lab 07/28/21 2153  GLUCAP 52*    Microbiology: Results for orders placed or performed during the hospital encounter of 07/26/21  Resp Panel by RT-PCR (Flu A&B, Covid) Nasopharyngeal Swab     Status: None   Collection Time: 07/26/21  7:19 PM   Specimen: Nasopharyngeal Swab; Nasopharyngeal(NP) swabs in vial transport medium  Result Value Ref Range Status  SARS Coronavirus 2 by RT PCR NEGATIVE NEGATIVE Final    Comment: (NOTE) SARS-CoV-2 target nucleic acids are NOT DETECTED.  The SARS-CoV-2 RNA is generally detectable in upper respiratory specimens during the acute phase of infection. The lowest concentration of SARS-CoV-2 viral copies this assay can detect is 138 copies/mL. A negative result does not preclude SARS-Cov-2 infection and should not be used as the sole basis for treatment or other patient management decisions. A negative result may occur with  improper specimen  collection/handling, submission of specimen other than nasopharyngeal swab, presence of viral mutation(s) within the areas targeted by this assay, and inadequate number of viral copies(<138 copies/mL). A negative result must be combined with clinical observations, patient history, and epidemiological information. The expected result is Negative.  Fact Sheet for Patients:  EntrepreneurPulse.com.au  Fact Sheet for Healthcare Providers:  IncredibleEmployment.be  This test is no t yet approved or cleared by the [place] FDA and  has been authorized for detection and/or diagnosis of SARS-CoV-2 by FDA under an Emergency Use Authorization (EUA). This EUA will remain  in effect (meaning this test can be used) for the duration of the COVID-19 declaration under Section 564(b)(1) of the Act, 21 U.S.C.section 360bbb-3(b)(1), unless the authorization is terminated  or revoked sooner.       Influenza A by PCR NEGATIVE NEGATIVE Final   Influenza B by PCR NEGATIVE NEGATIVE Final    Comment: (NOTE) The Xpert Xpress SARS-CoV-2/FLU/RSV plus assay is intended as an aid in the diagnosis of influenza from Nasopharyngeal swab specimens and should not be used as a sole basis for treatment. Nasal washings and aspirates are unacceptable for Xpert Xpress SARS-CoV-2/FLU/RSV testing.  Fact Sheet for Patients: EntrepreneurPulse.com.au  Fact Sheet for Healthcare Providers: IncredibleEmployment.be  This test is not yet approved or cleared by the [place] FDA and has been authorized for detection and/or diagnosis of SARS-CoV-2 by FDA under an Emergency Use Authorization (EUA). This EUA will remain in effect (meaning this test can be used) for the duration of the COVID-19 declaration under Section 564(b)(1) of the Act, 21 U.S.C. section 360bbb-3(b)(1), unless the authorization is terminated or revoked.  Performed at [facility], [address] [place], [place] [address]   Body fluid culture w Gram Stain     Status: None (Preliminary result)   Collection Time: 07/28/21  1:53 PM   Specimen: PATH Cytology Pleural fluid  Result Value Ref Range Status   Specimen Description   Final    PLEURAL Performed at [facility], [address], [place], [place] [address]    Special Requests   Final    NONE Performed at [facility], [address] [place], [place] [address]    Gram Stain   Final    NO SQUAMOUS EPITHELIAL CELLS SEEN FEW WBC SEEN NO ORGANISMS SEEN Performed at [facility], [place] [address], [place], [place] [address]    Culture PENDING  Incomplete   Report Status PENDING  Incomplete    Coagulation Studies: No results for input(s): LABPROT, INR in the last 72 hours.  Urinalysis: No results for input(s): COLORURINE, LABSPEC, PHURINE, GLUCOSEU, HGBUR, BILIRUBINUR, KETONESUR, PROTEINUR, UROBILINOGEN, NITRITE, LEUKOCYTESUR in the last 72 hours.  Invalid input(s): APPERANCEUR    Imaging: DG Chest Port 1 View  Result Date: 07/28/2021 CLINICAL DATA:  Post left-sided thoracentesis. EXAM: PORTABLE CHEST 1 VIEW COMPARISON:  Earlier same day; 07/26/2021 FINDINGS: Grossly unchanged enlarged cardiac silhouette and mediastinal contours with atherosclerotic plaque within  the aortic arch. Interval reduction in persistent trace left-sided effusion post thoracentesis. Improved aeration of left lung base with persistent partial obscuration of the left heart border secondary to left basilar heterogeneous/consolidative opacities. The right hemithorax remains well aerated. Mild point is congestion without frank evidence of edema. No pneumothorax. No acute osseous abnormalities. IMPRESSION: 1. No evidence of complication following left-sided thoracentesis. Specifically, no pneumothorax. 2. Improved aeration of left lung base with persistent left basilar opacities, atelectasis versus  infiltrate. 3. Otherwise, similar findings of cardiomegaly and pulmonary venous congestion without frank evidence of edema. 4.  Aortic Atherosclerosis (ICD10-I70.0). Electronically Signed   By: Sandi Mariscal M.D.   On: 07/28/2021 14:04   DG Chest Port 1 View  Result Date: 07/28/2021 CLINICAL DATA:  75 year old female with history of increasing shortness of breath with low oxygen saturations. EXAM: PORTABLE CHEST 1 VIEW COMPARISON:  Chest x-ray 07/26/2021. FINDINGS: Persistent opacification throughout the left mid to lower hemithorax. Right lung is clear. No definite right pleural effusion. No pneumothorax. Cardiac silhouette is obscured. The patient is rotated to the left on today's exam, resulting in distortion of the mediastinal contours and reduced diagnostic sensitivity and specificity for mediastinal pathology. IMPRESSION: 1. Persistent opacification in the left mid to lower hemithorax compatible with areas of atelectasis and/or consolidation, likely with superimposed large left pleural effusion. 2. Aortic atherosclerosis. Electronically Signed   By: Vinnie Langton M.D.   On: 07/28/2021 10:32   US THORACENTESIS ASP PLEURAL SPACE W/IMG GUIDE  Result Date: 07/28/2021 INDICATION: Symptomatic left-sided pleural effusion. Please from ultrasound-guided thoracentesis for diagnostic and therapeutic purposes. EXAM: US THORACENTESIS ASP PLEURAL SPACE W/IMG GUIDE COMPARISON:  Chest radiograph-earlier same day; 05/20/2021; ultrasound-guided left-sided thoracentesis yielding 400 cc pleural fluid. MEDICATIONS: None. COMPLICATIONS: None immediate. TECHNIQUE: Informed written consent was obtained from the patient after a discussion of the risks, benefits and alternatives to treatment. A timeout was performed prior to the initiation of the procedure. Initial ultrasound scanning demonstrates a small anechoic left-sided pleural effusion. The lower chest was prepped and draped in the usual sterile fashion. 1% lidocaine was  used for local anesthesia. Under direct ultrasound guidance, an 8 Fr Safe-T-Centesis catheter was introduced. The thoracentesis was performed. The catheter was removed and a dressing was applied. The patient tolerated the procedure well without immediate post procedural complication. The patient was escorted to have an upright chest radiograph. FINDINGS: A total of approximately 600 cc of serous fluid was removed. Requested samples were sent to the laboratory. IMPRESSION: Successful ultrasound-guided left sided thoracentesis yielding 600 cc of pleural fluid. Electronically Signed   By: Sandi Mariscal M.D.   On: 07/28/2021 14:06     Medications:     furosemide  20 mg Intravenous BID   latanoprost  1 drop Both Eyes QHS   metoprolol succinate  25 mg Oral Daily   midodrine  10 mg Oral TID WC   potassium chloride  40 mEq Oral Daily   sodium chloride  1 g Oral TID WC     Assessment/ Plan:  Ms. REES MATURA is a 75 y.o. white female with diastolic congestive heart failure, hypertension, hyperlipidemia, lymphedema, atrial fibrillation, peripheral vascular disease who is admitted to [facility] on 07/26/2021 for Morbid obesity ([place]) [E66.01] DOE (dyspnea on exertion) [R06.09] Recurrent left pleural effusion [J90] Hypotension [I95.9] Type 2 diabetes mellitus without complication, without long-term current use of insulin ([facility]) [E11.9] Atrial fibrillation, unspecified type ([place]) [I48.91] Chronic congestive heart failure, unspecified heart failure type ([place]) [I50.9]  Acute kidney  injury on chronic kidney disease stage IIIB: with baseline creatinine of 1.66, GFR of 36 on 07/13/21. History of bland urine. 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  - Continue to hold patient's benazepril and spironolactone.  - Discontinue metolazone.   Hypotension: on metoprolol, furosemide and midodrine. BP 111/85. Maxed out on midodrine dose. Other options included decreasing dose of metoprolol.    Acute exacerbation of diastolic congestive heart failure:  - discontinue sodium chloride - discontinue metolazone - fluid restriction  Hyponatremia: with hypervolumia.  - Continue fluid restriction.  - metolazone as above.    LOS: 3 Regina Coppolino 10/9/20222:24 PM

## 2021-07-30 DIAGNOSIS — I5033 Acute on chronic diastolic (congestive) heart failure: Secondary | ICD-10-CM | POA: Diagnosis not present

## 2021-07-30 LAB — TRIGLYCERIDES, BODY FLUIDS: Triglycerides, Fluid: 31 mg/dL

## 2021-07-30 LAB — PATHOLOGIST SMEAR REVIEW

## 2021-07-30 MED ORDER — METOPROLOL SUCCINATE ER 25 MG PO TB24
12.5000 mg | ORAL_TABLET | Freq: Every day | ORAL | Status: DC
  Administered 2021-07-30 – 2021-07-31 (×2): 12.5 mg via ORAL
  Filled 2021-07-30 (×2): qty 1

## 2021-07-30 MED ORDER — HYDROCORTISONE SOD SUC (PF) 100 MG IJ SOLR
100.0000 mg | Freq: Three times a day (TID) | INTRAMUSCULAR | Status: DC
  Administered 2021-07-30 – 2021-08-02 (×10): 100 mg via INTRAVENOUS
  Filled 2021-07-30 (×10): qty 2

## 2021-07-30 MED ORDER — ALBUMIN HUMAN 25 % IV SOLN
12.5000 g | Freq: Every day | INTRAVENOUS | Status: DC
  Administered 2021-07-30: 12.5 g via INTRAVENOUS
  Filled 2021-07-30: qty 50

## 2021-07-30 MED ORDER — ACETAMINOPHEN 325 MG PO TABS
650.0000 mg | ORAL_TABLET | ORAL | Status: DC | PRN
  Administered 2021-07-30 – 2021-08-20 (×9): 650 mg via ORAL
  Filled 2021-07-30 (×11): qty 2

## 2021-07-30 MED ORDER — FUROSEMIDE 10 MG/ML IJ SOLN
40.0000 mg | Freq: Two times a day (BID) | INTRAMUSCULAR | Status: DC
  Administered 2021-07-30 – 2021-08-01 (×4): 40 mg via INTRAVENOUS
  Filled 2021-07-30 (×4): qty 4

## 2021-07-30 NOTE — Plan of Care (Signed)
Late entry for PT goals, all PT goals & POC are appropriate.   Lavone Nian, PT, DPT 07/30/21, 3:11 PM  Problem: Acute Rehab PT Goals(only PT should resolve) Goal: Pt Will Ambulate Flowsheets (Taken 07/30/2021 1510) Pt will Ambulate:  > 125 feet  with modified independence  with least restrictive assistive device Goal: Pt/caregiver will Perform Home Exercise Program Flowsheets (Taken 07/30/2021 1510) Pt/caregiver will Perform Home Exercise Program:  For increased strengthening  For improved balance  Independently

## 2021-07-30 NOTE — Progress Notes (Signed)
Pulmonary Medicine          Date: 07/30/2021,   MRN# [id number] Karen Farley 09/01/1946     AdmissionWeight: 105.7 kg                 CurrentWeight: 104 kg   Referring physician: Dr. Benny Lennert   CHIEF COMPLAINT:   Acute hypoxemic respiratory failure secondary to bilateral pleural effusions   HISTORY OF PRESENT ILLNESS   This is a pleasant 75 year old female with a medical history of diastolic CHF, atrial fibrillation on Eliquis, increased shortness of breath and edema on arrival.  Patient was recently hospitalized and relates that after discharge had worsening abdominal girth and lower extremity edema specifically over the last 2 weeks with notable pitting.  On arrival she had chest imaging done with x-ray showing left-sided pleural effusion.  She was referred to pulmonary in outpatient but clinically deteriorated and wanted to be seen prior to her appointment so came to hospital due to this reason.  She does have CKD and has been seen by nephrology on the last admission.  Was placed on torsemide metolazone and Aldactone at that time.  She was able to see nephrology on outpatient and this was reduced to 20 mg 3 times weekly.  On admission she had hypotension which was fluid responsive with an AKI on CKD chest x-ray again showing large left-sided pleural effusion.  PCCM consultation due to acute hypoxemic respiratory failure with pleural effusions.  07/29/21- patient improved post thoracentesis, she did PT today  07/30/21- patient is 2200cc UOP overnight but lower extermities are more swollen.  Reviewed need to reduce fluid intake patient shares she has been drinking freely and is not on restriction.  We advanced diet to 1200cc fluid restriction.  Her breathing is less labored after thoracentesis. Pleural fluid profile is monocyte predominant serous simple transudate indicative of chronic benign fluid.   PAST MEDICAL HISTORY   Past Medical History:  Diagnosis Date    Chronic heart failure with preserved ejection fraction (HFpEF) ([facility])    a. 11/2019 Echo: EF 60-65%, no rwma, Nl RV size/fxn. Mod dil LA.   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)    DDD (degenerative disc disease), lumbar    Degenerative disc disease, lumbar    bulging and dengerated   Diabetes mellitus without complication (HCC)    Hyperlipidemia    Hypertension    Lymphedema    Morbid obesity (HCC)    Osteoarthritis of both knees    Osteopenia      SURGICAL HISTORY   Past Surgical History:  Procedure Laterality Date   CHOLECYSTECTOMY     DILATION AND CURETTAGE OF UTERUS     TEAR DUCT PROBING WITH STRABISMUS REPAIR Right    TONSILLECTOMY       FAMILY HISTORY   Family History  Problem Relation Age of Onset   Heart disease Mother        CHF   Osteoporosis Mother    Breast cancer Mother    Heart disease Father 64   Cancer Brother        Colon CA- 2002- Youngest brother   Parkinson's disease Brother        Younger Brother   Heart disease Brother        2 brothers   Cancer Brother    Breast cancer Maternal Grandmother      SOCIAL HISTORY   Social History   Tobacco Use   Smoking status:  Never   Smokeless tobacco: Never  Vaping Use   Vaping Use: Never used  Substance Use Topics   Alcohol use: Not Currently    Alcohol/week: 0.0 standard drinks    Comment: on rare occasion   Drug use: No     MEDICATIONS    Home Medication:    Current Medication:  Current Facility-Administered Medications:    acetaminophen (TYLENOL) tablet 650 mg, 650 mg, Oral, Q4H PRN, Mansy, Jan A, MD, 650 mg at 07/30/21 4818   furosemide (LASIX) injection 20 mg, 20 mg, Intravenous, BID, Swayze, Ava, DO, 20 mg at 07/30/21 0906   latanoprost (XALATAN) 0.005 % ophthalmic solution 1 drop, 1 drop, Both Eyes, QHS, Tu, Ching T, DO, 1 drop at 07/29/21 2019   metoprolol succinate (TOPROL-XL) 24 hr tablet 12.5 mg, 12.5 mg, Oral, Daily, Swayze, Ava, DO, 12.5 mg at 07/30/21 0906    midodrine (PROAMATINE) tablet 10 mg, 10 mg, Oral, TID WC, Swayze, Ava, DO, 10 mg at 07/30/21 0906   potassium chloride SA (KLOR-CON) CR tablet 40 mEq, 40 mEq, Oral, Daily, Tu, Ching T, DO, 40 mEq at 07/30/21 0906    ALLERGIES   Patient has no known allergies.     REVIEW OF SYSTEMS    Review of Systems:  Gen:  Denies  fever, sweats, chills weigh loss  HEENT: Denies blurred vision, double vision, ear pain, eye pain, hearing loss, nose bleeds, sore throat Cardiac:  No dizziness, chest pain or heaviness, chest tightness,edema Resp:   Denies cough or sputum porduction, shortness of breath,wheezing, hemoptysis,  Gi: Denies swallowing difficulty, stomach pain, nausea or vomiting, diarrhea, constipation, bowel incontinence Gu:  Denies bladder incontinence, burning urine Ext:   Denies Joint pain, stiffness or swelling Skin: Denies  skin rash, easy bruising or bleeding or hives Endoc:  Denies polyuria, polydipsia , polyphagia or weight change Psych:   Denies depression, insomnia or hallucinations   Other:  All other systems negative   VS: BP (!) 110/41 (BP Location: Right Arm)   Pulse 62   Temp 98.2 F (36.8 C)   Resp 17   Ht 5\' 2"  (1.575 m)   Wt 104 kg   SpO2 100%   BMI 41.92 kg/m      PHYSICAL EXAM    GENERAL:NAD, no fevers, chills, no weakness no fatigue HEAD: Normocephalic, atraumatic.  EYES: Pupils equal, round, reactive to light. Extraocular muscles intact. No scleral icterus.  MOUTH: Moist mucosal membrane. Dentition intact. No abscess noted.  EAR, NOSE, THROAT: Clear without exudates. No external lesions.  NECK: Supple. No thyromegaly. No nodules. No JVD.  PULMONARY: Decreased breath sounds bilaterally at bases.  CARDIOVASCULAR: S1 and S2. Regular rate and rhythm. No murmurs, rubs, or gallops. No edema. Pedal pulses 2+ bilaterally.  GASTROINTESTINAL: Soft, nontender, nondistended. No masses. Positive bowel sounds. No hepatosplenomegaly.  MUSCULOSKELETAL: No  swelling, clubbing, or edema. Range of motion full in all extremities.  NEUROLOGIC: Cranial nerves II through XII are intact. No gross focal neurological deficits. Sensation intact. Reflexes intact.  SKIN: No ulceration, lesions, rashes, or cyanosis. Skin warm and dry. Turgor intact.  PSYCHIATRIC: Mood, affect within normal limits. The patient is awake, alert and oriented x 3. Insight, judgment intact.       IMAGING    DG Chest 2 View  Result Date: 07/20/2021 CLINICAL DATA:  Patient is here to follow up for pleural effusion. Patient complains of cough since April and has fluid retention. Patient denies asthma and is a non smoker, but has  a history of a A-fib and has no other lung/heart issues or surgeries. EXAM: CHEST - 2 VIEW COMPARISON:  Radiography of the chest dated 05/21/2021. Radiography of the chest dated 05/20/2021. FINDINGS: Persistent enlargement of the cardiac silhouette. Atherosclerotic calcification of the aortic knob. Moderate-large left-sided pleural effusion with left basilar atelectasis. Right lung is clear. No pneumothorax. IMPRESSION: Persistent moderate-large left-sided pleural effusion with left basilar atelectasis. Electronically Signed   By: Davina Poke D.O.   On: 07/17/2021 11:03   DG Chest Port 1 View  Result Date: 07/28/2021 CLINICAL DATA:  Post left-sided thoracentesis. EXAM: PORTABLE CHEST 1 VIEW COMPARISON:  Earlier same day; 07/26/2021 FINDINGS: Grossly unchanged enlarged cardiac silhouette and mediastinal contours with atherosclerotic plaque within the aortic arch. Interval reduction in persistent trace left-sided effusion post thoracentesis. Improved aeration of left lung base with persistent partial obscuration of the left heart border secondary to left basilar heterogeneous/consolidative opacities. The right hemithorax remains well aerated. Mild point is congestion without frank evidence of edema. No pneumothorax. No acute osseous abnormalities. IMPRESSION: 1.  No evidence of complication following left-sided thoracentesis. Specifically, no pneumothorax. 2. Improved aeration of left lung base with persistent left basilar opacities, atelectasis versus infiltrate. 3. Otherwise, similar findings of cardiomegaly and pulmonary venous congestion without frank evidence of edema. 4.  Aortic Atherosclerosis (ICD10-I70.0). Electronically Signed   By: Sandi Mariscal M.D.   On: 07/28/2021 14:04   DG Chest Port 1 View  Result Date: 07/28/2021 CLINICAL DATA:  75 year old female with history of increasing shortness of breath with low oxygen saturations. EXAM: PORTABLE CHEST 1 VIEW COMPARISON:  Chest x-ray 07/26/2021. FINDINGS: Persistent opacification throughout the left mid to lower hemithorax. Right lung is clear. No definite right pleural effusion. No pneumothorax. Cardiac silhouette is obscured. The patient is rotated to the left on today's exam, resulting in distortion of the mediastinal contours and reduced diagnostic sensitivity and specificity for mediastinal pathology. IMPRESSION: 1. Persistent opacification in the left mid to lower hemithorax compatible with areas of atelectasis and/or consolidation, likely with superimposed large left pleural effusion. 2. Aortic atherosclerosis. Electronically Signed   By: Vinnie Langton M.D.   On: 07/28/2021 10:32   DG Chest Portable 1 View  Result Date: 07/26/2021 CLINICAL DATA:  Shortness of breath EXAM: PORTABLE CHEST 1 VIEW COMPARISON:  07/16/2021 FINDINGS: Large left-sided pleural effusion slightly increased. Probable trace right effusion. Cardiomegaly with vascular congestion. Persistent consolidation in the left lower lung. IMPRESSION: Large left-sided pleural effusion with airspace disease in the left mid to lower lung. Trace right pleural effusion. Cardiomegaly with vascular congestion Electronically Signed   By: Donavan Foil M.D.   On: 07/26/2021 18:10   US THORACENTESIS ASP PLEURAL SPACE W/IMG GUIDE  Result Date:  07/28/2021 INDICATION: Symptomatic left-sided pleural effusion. Please from ultrasound-guided thoracentesis for diagnostic and therapeutic purposes. EXAM: US THORACENTESIS ASP PLEURAL SPACE W/IMG GUIDE COMPARISON:  Chest radiograph-earlier same day; 05/20/2021; ultrasound-guided left-sided thoracentesis yielding 400 cc pleural fluid. MEDICATIONS: None. COMPLICATIONS: None immediate. TECHNIQUE: Informed written consent was obtained from the patient after a discussion of the risks, benefits and alternatives to treatment. A timeout was performed prior to the initiation of the procedure. Initial ultrasound scanning demonstrates a small anechoic left-sided pleural effusion. The lower chest was prepped and draped in the usual sterile fashion. 1% lidocaine was used for local anesthesia. Under direct ultrasound guidance, an 8 Fr Safe-T-Centesis catheter was introduced. The thoracentesis was performed. The catheter was removed and a dressing was applied. The patient tolerated the procedure well  without immediate post procedural complication. The patient was escorted to have an upright chest radiograph. FINDINGS: A total of approximately 600 cc of serous fluid was removed. Requested samples were sent to the laboratory. IMPRESSION: Successful ultrasound-guided left sided thoracentesis yielding 600 cc of pleural fluid. Electronically Signed   By: Sandi Mariscal M.D.   On: 07/28/2021 14:06      ASSESSMENT/PLAN   Large left pleural effusion -likely due to CHF/CKD - patient slow to diurese - will order thoracentesis with fluid studies.  Previously patient had thoracentesis and shares she felt better immediately.  -serial CXR  -will reviewe fluid studies post thoracentesis, she does not appear acutely toxic from pneumonia I dought this will be empyema but it could be exudate due to ongoing diuresis -Monocyte predominant transudate - benign fluid likely due to CHF/CKD   CKD   Nephrology to guide appropriate  diuresis -reviewed with nephrology - adding solucortef and albumin due to persistent transient hypotension despite 10 TID of midodrine   Thank you for allowing me to participate in the care of this patient.   Patient/Family are satisfied with care plan and all questions have been answered.  This document was prepared using Dragon voice recognition software and may include unintentional dictation errors.     Ottie Glazier, M.D.  Division of [facility]

## 2021-07-30 NOTE — Consult Note (Signed)
[facility] Nurse Consult Note: Patient receiving care in [facility]. Reason for Consult: BLE swelling with blisters Wound type: lymphedema with erythema and fluid filled bullae to BLE pre-tibial areas Pressure Injury POA: Yes/No/NA Measurement: Wound bed: Currently bullae are intact without weeping. Drainage (amount, consistency, odor)  Periwound: Dressing procedure/placement/frequency: Place Xeroform gauze Kellie Simmering [id number]) over blisters on BLE, then ABD pads over the same areas. Beginning behind the toes and going to just below the knees, spiral wrap kerlex, then 4 inch ace wraps. BE SURE TO GO FROM JUST BEHIND THE TOES TO JUST BELOW THE KNEES. USE MORE THAN ONE ACE WRAP IF NEEDED. Perform TWICE daily. Moisten the old dressing with saline to loosen if stuck to the skin.  Patient normally wears BLE compression hose and compression sleeves, but her legs are too large to use her home products.  She is in agreement with this approach as an intermediary step to get her back in her home products.  Monitor the wound area(s) for worsening of condition such as: Signs/symptoms of infection,  Increase in size,  Development of or worsening of odor, Development of pain, or increased pain at the affected locations.  Notify the medical team if any of these develop.  Thank you for the consult.  Discussed plan of care with the patient.  [facility] nurse will not follow at this time.  Please re-consult the [facility] team if needed.  Val Riles, RN, MSN, CWOCN, CNS-BC, pager [phone]

## 2021-07-30 NOTE — Care Management Important Message (Signed)
Important Message  Patient Details  Name: Karen Farley MRN: [id number] Date of Birth: November 28, 1945   Medicare Important Message Given:  Yes     Dannette Barbara 07/30/2021, 4:26 PM

## 2021-07-30 NOTE — Plan of Care (Signed)
  Problem: Clinical Measurements: Goal: Ability to maintain clinical measurements within normal limits will improve Outcome: Progressing Goal: Will remain free from infection Outcome: Progressing Goal: Diagnostic test results will improve Outcome: Progressing Goal: Respiratory complications will improve Outcome: Progressing Goal: Cardiovascular complication will be avoided Outcome: Progressing   Problem: Elimination: Goal: Will not experience complications related to bowel motility Outcome: Progressing Goal: Will not experience complications related to urinary retention Outcome: Progressing   Problem: Coping: Goal: Level of anxiety will decrease Outcome: Progressing   Problem: Nutrition: Goal: Adequate nutrition will be maintained Outcome: Progressing   Problem: Pain Managment: Goal: General experience of comfort will improve Outcome: Progressing

## 2021-07-30 NOTE — Progress Notes (Addendum)
   Heart Failure Nurse Navigator Note  Met with the patient today, she was sitting up in the chair at bedside with legs elevated.  Legs and feet remain very edematous and blisters noted on her bilateral shins that were intact.  Along with erythema skin.  This was not noted at this extent on Friday's exam.   Discussed with her nurse Roselyn Reef RN getting a wound consult.  Also discussed with Dr. Benny Lennert during progression rounds.  She states at home that she was using her lymphedema pumps for 3 times a day for an hour in duration.  Patient states that after the thoracentesis she still does not feel that she can take a full breath, does not feel like her lungs are expanding as she had noted with her prior thoracentesis.  Went back later in the afternoon when husband was present.  Discussed her fluid restriction and he states that he was not aware that she should be on a fluid restriction.  He keeps a large tumbler by her chair as she empties it he continues to refill it.  Unknown amount of fluid that she is getting daily.  Discussed filling a 2 L bottle with her water allotment that way they would have an idea of how much she really was drinking in a days time.  They voiced understanding.  Explained the reasoning behind a fluid restriction especially when on a diuretic.   Pricilla Riffle RN [facility]

## 2021-07-30 NOTE — Progress Notes (Signed)
PROGRESS NOTE  Karen Farley JAS:[id number] DOB: June 22, 1946 DOA: 07/26/2021 PCP: Valerie Roys, DO  Brief History    Karen Farley is a 75 y.o. female with medical history significant for HFpEF, permanent atrial fibrillation on Eliquis, PAD, type 2 diabetes, CKD stage IIIb, lymphedema who presents with concerns of increasing shortness of breath and edema.   Patient notes that since her last hospitalization in August she has been having increasing abdominal distention.  Had increasing lower extremity edema on top of her feet for the past 2 weeks.  She also notes worsening dyspnea on exertion for the past several weeks and had chest x-ray with PCP showing recurrence of left-sided pleural effusion.  She was in the process of being referred to pulmonology for thoracentesis but could not wait for the appointment due to worsening symptoms and decided to present to the ED.   Patient last admitted on 7/28-8/7 with Acute on chronic diastolic CHF, left-sided pleural effusion and acute on chronic CKD.  Both cardiology and nephrology was consulted and she was treated with IV diuretic. Had thoracentesis on 8/1 with 400 cc removed likely transudative fluid based on limited lab work  she had metolazone, torsemide and Aldactone held at discharge. She had follow-up with nephrology on 8/11 and had restarted torsemide 20 mg 3 times per week and metolazone 2.5 mg once a week.  Later switched Torsemide every other day since she had discussion with nephrology that it would help make it easier for  her. States she still has good urine output with changes in regimen.  However she has been monitoring her weight daily and has noted a 5lb  weight gain from 218 to 223lb since August. Last dose of Torsemide yesterday.    ED Course: She was afebrile, hypotensive down to 84/62 initially which improved with 500 cc normal saline fluid.  Sodium of 128, K of 2.6, chloride of 79, creatinine of 1.78 around her baseline, BG  195.  Magnesium of 2.2.  BNP of 154.  Normal AST and ALT.  T bili mildly elevated at 2.6. Chest x-ray showing large left-sided pleural effusion.  Pulmonology consulted for effusion. Patient has gone for thoracentesis with removal of 600 cc of fluid which will be sent for study.  The patient is complaining of increasing edema and swelling of lower extremities bilaterally.   Consultants  Pulmonology Heart Failure Nephrology  Procedures  None  Antibiotics   Anti-infectives (From admission, onward)    None       Subjective  The patient is sitting up at bedside. Complaining of increasing swelling and edema bilaterally.  Objective   Vitals:  Vitals:   07/30/21 0739 07/30/21 1215  BP: (!) 110/41 (!) 115/54  Pulse: 62 77  Resp: 17 17  Temp: 98.2 F (36.8 C) 98.1 F (36.7 C)  SpO2: 100% 99%    Exam:  Constitutional:  Awake, alert, and oriented x 3.  Respiratory:  CTA bilaterally, no w/r/r.  Respiratory effort normal. No retractions or accessory muscle use Diminished breath sounds and dullness to percussion on right base. Cardiovascular:  RRR, no m/r/g Worsening lower extremity edema particularly on the dorsum of feet with erythema and blistering/weeping of lower extremities bilaterally.  Normal pedal pulses Abdomen:  Abdomen appears normal; no tenderness or masses No hernias No HSM Musculoskeletal:  Digits/nails BUE: no clubbing, cyanosis, petechiae, infection exam of joints, bones, muscles of at least one of following: head/neck, RUE, LUE, RLE, LLE   Skin:  No rashes, lesions,  ulcers palpation of skin: no induration or nodules Neurologic:  CN 2-12 intact Sensation all 4 extremities intact Psychiatric:  Mental status Mood, affect appropriate Orientation to person, place, time  judgment and insight appear intact    I have personally reviewed the following:   Today's Data  Vitals  Lab Data  CBC, BMP  Imaging  CXR  Cardiology Data   EKG  Scheduled Meds:  furosemide  40 mg Intravenous BID   hydrocortisone sod succinate (SOLU-CORTEF) inj  100 mg Intravenous Q8H   latanoprost  1 drop Both Eyes QHS   metoprolol succinate  12.5 mg Oral Daily   midodrine  10 mg Oral TID WC   potassium chloride  40 mEq Oral Daily   Continuous Infusions:  albumin human 12.5 g (07/30/21 1220)    Principal Problem:   Acute on chronic diastolic CHF (congestive heart failure) (HCC) Active Problems:   Type 2 diabetes mellitus with renal complication (HCC)   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 diabetes mellitus (HCC)   Atrial fibrillation (HCC)   Hypotension   Pleural effusion   LOS: 4 days  Acute on chronic diastolic CHF Anasarca: Worsening edema of lower extremities bilaterally. -Pt on torsemide 20 mg 3 times per week and metolazone 2.5 mg once a week here with large pleural effusion, anasarca and increasing LE edema -Very complex heart failure and CKD patient. Her creatinine has been sensitive to increasing diuresis in the past. Also very susceptible to hypovolemia and hypotension to increasing diuresis.  -needs cardiology and nephrology consult. There was discussion of referring for placement of CardioMEMS by cardiology in the past which needs to be re-addressed.  -recent echo on 04/2021 with EF of 60-65%  -start IV Lasix 40 mg IV bid, but diuresis is limited by hypotension. Patient may require midodrine to bring pressures up so that more effective diuresis may be obtained.  Consult nephrology for help with diuresis. I appreciate their help.   Left pleural effusion secondary to diastolic CHF Consult pulm for thoracentesis if unable to diurese off. 600 cc removed today. LIkely transudative.   Hypotension BP low normal. Monitor. This is limiting the ability to diurese this patient. Have increased midodrine to 10 mg tid. Metoprolol XL has been decreased to 12.5 daily.    Hypokalemia Resolved. Monitor. Likely due to diuretics Daily oral  supplementation   Hyponatremia Sodium of 127 likely from diuresis improved to 129. Fluid restriction increased to 1200 cc daily per PCCM.   CKD stage 3b -creatinine stable at 1.68 close to baseline around 1.4-1.7. I appreciate nephrology's help.   Permanent atrial fibrillation Last Eliquis dose this morning.  Held   Type 2 diabetes Diet controlled   I have seen and examined this patient myself. I have spent 35 minutes in her evaluation and care.   DVT prophylaxis:.on Eliquis Code Status: Full Family Communication: Plan discussed with patient and husband at bedside  Disposition Plan: Home with at least 2 midnight stays  Consults called:  Admission status: inpatient   Level of care: Progressive Cardiac   Status is: Inpatient   Remains inpatient appropriate because:Inpatient level of care appropriate due to severity of illness   Dispo: The patient is from: Home              Anticipated d/c is to: Home              Patient currently is not medically stable to d/c.  Difficult to place patient No   Carmin Alvidrez, DO [facility] 07/30/2021 2:22.

## 2021-07-30 NOTE — Progress Notes (Signed)
[facility]  ROUNDING NOTE   Subjective:   [name] was admitted to [facility] on 07/26/2021 for Morbid obesity ([place]) [E66.01] DOE (dyspnea on exertion) [R06.09] Recurrent left pleural effusion [J90] Hypotension [I95.9] Type 2 diabetes mellitus without complication, without long-term current use of insulin ([place]) [E11.9] Atrial fibrillation, unspecified type ([place]) [I48.91] Chronic congestive heart failure, unspecified heart failure type [facility]) [I50.9]  Patient was last seen by my partner, Dr. Candiss Norse, on 05/31/21. Where torsemide was restarted and to continue metolazone.   Patient admitted with shortness of breath and increasing peripheral edema. She was then started on IV furosemide. Left thoracentesis done yesterday on 07/28/21  614mL of pleural fluid.   Patient seen sitting at side of bed States she has been drinking more than she should Continues to use lymphedema pumps at home 2-3 times daily States swelling and blisters appeared after admission Denies shortness of breath   Objective:  Vital signs in last 24 hours:  Temp:  [97.6 F (36.4 C)-98.2 F (36.8 C)] 98.1 F (36.7 C) (10/10 1215) Pulse Rate:  [62-80] 77 (10/10 1215) Resp:  [17-20] 17 (10/10 1215) BP: (90-116)/(41-78) 115/54 (10/10 1215) SpO2:  [96 %-100 %] 99 % (10/10 1215) Weight:  [104 kg] 104 kg (10/10 0326)  Weight change: 0.59 kg Filed Weights   07/28/21 0540 07/29/21 0600 07/30/21 0326  Weight: 102 kg 103.4 kg 104 kg    Intake/Output: I/O last 3 completed shifts: In: 1517.1 [P.O.:1080; IV Piggyback:437.1] Out: 3450 [Urine:3450]   Intake/Output this shift:  Total I/O In: 879.7 [P.O.:840; IV Piggyback:39.7] Out: 400 [Urine:400]  Physical Exam: General: NAD, sitting up in bed  Head: Normocephalic, atraumatic. Moist oral mucosal membranes  Eyes: Anicteric  Lungs:  Clear bilaterally, normal effort  Heart: Regular rate and rhythm  Abdomen:  Soft, nontender  Extremities:  ++  peripheral edema.  Neurologic: Nonfocal, moving all four extremities  Skin: Bilateral lower extremity erythema and fluid filled blisters        Basic Metabolic Panel: Recent Labs  Lab 07/26/21 1708 07/27/21 0637 07/27/21 1926 07/28/21 0753 07/28/21 1828 07/29/21 0425  NA 128* 130* 126* 129* 127* 129*  K 2.6* 2.8* 3.5 2.8* 3.5 3.5  CL 79* 83* 82* 82* 86* 87*  CO2 34* 33* 32 33* 32 32  GLUCOSE 195* 147* 201* 138* 229* 152*  BUN 65* 63* 62* 59* 55* 51*  CREATININE 1.78* 1.57* 1.85* 1.68* 1.56* 1.52*  CALCIUM 9.4 9.4 9.1 9.1 9.3 9.3  MG 2.2  --   --   --   --   --      Liver Function Tests: Recent Labs  Lab 07/26/21 1708  AST 27  ALT 16  ALKPHOS 68  BILITOT 2.3*  PROT 6.1*  ALBUMIN 3.2*    No results for input(s): LIPASE, AMYLASE in the last 168 hours. No results for input(s): AMMONIA in the last 168 hours.  CBC: Recent Labs  Lab 07/26/21 1708 07/28/21 0753  WBC 8.1 6.4  NEUTROABS  --  4.8  HGB 13.6 13.2  HCT 42.5 42.2  MCV 77.1* 75.8*  PLT 341 354     Cardiac Enzymes: No results for input(s): CKTOTAL, CKMB, CKMBINDEX, TROPONINI in the last 168 hours.  BNP: Invalid input(s): POCBNP  CBG: Recent Labs  Lab 07/28/21 2153 07/29/21 2102  GLUCAP 50* 172*     Microbiology: Results for orders placed or performed during the hospital encounter of 07/26/21  Resp Panel by RT-PCR (Flu A&B, Covid) Nasopharyngeal Swab  Status: None   Collection Time: 07/26/21  7:19 PM   Specimen: Nasopharyngeal Swab; Nasopharyngeal(NP) swabs in vial transport medium  Result Value Ref Range Status   SARS Coronavirus 2 by RT PCR NEGATIVE NEGATIVE Final    Comment: (NOTE) SARS-CoV-2 target nucleic acids are NOT DETECTED.  The SARS-CoV-2 RNA is generally detectable in upper respiratory specimens during the acute phase of infection. The lowest concentration of SARS-CoV-2 viral copies this assay can detect is 138 copies/mL. A negative result does not preclude  SARS-Cov-2 infection and should not be used as the sole basis for treatment or other patient management decisions. A negative result may occur with  improper specimen collection/handling, submission of specimen other than nasopharyngeal swab, presence of viral mutation(s) within the areas targeted by this assay, and inadequate number of viral copies(<138 copies/mL). A negative result must be combined with clinical observations, patient history, and epidemiological information. The expected result is Negative.  Fact Sheet for Patients:  EntrepreneurPulse.com.au  Fact Sheet for Healthcare Providers:  IncredibleEmployment.be  This test is no t yet approved or cleared by the [place] FDA and  has been authorized for detection and/or diagnosis of SARS-CoV-2 by FDA under an Emergency Use Authorization (EUA). This EUA will remain  in effect (meaning this test can be used) for the duration of the COVID-19 declaration under Section 564(b)(1) of the Act, 21 U.S.C.section 360bbb-3(b)(1), unless the authorization is terminated  or revoked sooner.       Influenza A by PCR NEGATIVE NEGATIVE Final   Influenza B by PCR NEGATIVE NEGATIVE Final    Comment: (NOTE) The Xpert Xpress SARS-CoV-2/FLU/RSV plus assay is intended as an aid in the diagnosis of influenza from Nasopharyngeal swab specimens and should not be used as a sole basis for treatment. Nasal washings and aspirates are unacceptable for Xpert Xpress SARS-CoV-2/FLU/RSV testing.  Fact Sheet for Patients: EntrepreneurPulse.com.au  Fact Sheet for Healthcare Providers: IncredibleEmployment.be  This test is not yet approved or cleared by the [place] FDA and has been authorized for detection and/or diagnosis of SARS-CoV-2 by FDA under an Emergency Use Authorization (EUA). This EUA will remain in effect (meaning this test can be used) for the duration of  the COVID-19 declaration under Section 564(b)(1) of the Act, 21 U.S.C. section 360bbb-3(b)(1), unless the authorization is terminated or revoked.  Performed at [facility], [address] [place], [place] [address]   Body fluid culture w Gram Stain     Status: None (Preliminary result)   Collection Time: 07/28/21  1:53 PM   Specimen: PATH Cytology Pleural fluid  Result Value Ref Range Status   Specimen Description   Final    PLEURAL Performed at [facility], [address], [place], [place] [address]    Special Requests   Final    NONE Performed at [facility], [address] [place], [place] [address]    Gram Stain   Final    NO SQUAMOUS EPITHELIAL CELLS SEEN FEW WBC SEEN NO ORGANISMS SEEN    Culture   Final    NO GROWTH 2 DAYS Performed at [facility], [place] [address] [place], [place] [address]    Report Status PENDING  Incomplete    Coagulation Studies: No results for input(s): LABPROT, INR in the last 72 hours.  Urinalysis: No results for input(s): COLORURINE, LABSPEC, PHURINE, GLUCOSEU, HGBUR, BILIRUBINUR, KETONESUR, PROTEINUR, UROBILINOGEN, NITRITE, LEUKOCYTESUR in the last 72 hours.  Invalid input(s): APPERANCEUR    Imaging: No results  found.   Medications:    albumin human 12.5 g (07/30/21 1220)    furosemide  40 mg Intravenous BID   hydrocortisone sod succinate (SOLU-CORTEF) inj  100 mg Intravenous Q8H   latanoprost  1 drop Both Eyes QHS   metoprolol succinate  12.5 mg Oral Daily   midodrine  10 mg Oral TID WC   potassium chloride  40 mEq Oral Daily     Assessment/ Plan:  Karen Farley is a 75 y.o. white female with diastolic congestive heart failure, hypertension, hyperlipidemia, lymphedema, atrial fibrillation, peripheral vascular disease who is admitted to [facility] on 07/26/2021 for Morbid obesity ([place]) [E66.01] DOE (dyspnea on exertion) [R06.09] Recurrent left pleural effusion [J90] Hypotension  [I95.9] Type 2 diabetes mellitus without complication, without long-term current use of insulin ([facility]) [E11.9] Atrial fibrillation, unspecified type ([place]) [I48.91] Chronic congestive heart failure, unspecified heart failure type ([facility]) [I50.9]  Acute kidney injury on chronic kidney disease stage IIIB: with baseline creatinine of 1.66, GFR of 36 on 07/13/21. History of bland urine. 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 Hold  benazepril and spironolactone.  - Will increase to IV Furosemide 40mg  twice daily - Albumin ordered daily to assist with fluid removal -Will consider increasing Metolazone to twice weekly at discharge.  Hypotension: on metoprolol, furosemide and midodrine. BP 111/85. Maxed out on midodrine dose. Other options included decreasing dose of metoprolol.   Acute exacerbation of diastolic congestive heart failure:  - IV Furosemide - fluid restriction  Hyponatremia: with hypervolumia.  - Continue fluid restriction.  - metolazone as above.   5. Left pleural effusion: Thoracentesis on 07/28/21 with 61ml removed. Solucortef ordered by Pulmonology.    LOS: [place] 10/10/20223:20 PM

## 2021-07-31 DIAGNOSIS — I5033 Acute on chronic diastolic (congestive) heart failure: Secondary | ICD-10-CM | POA: Diagnosis not present

## 2021-07-31 LAB — BASIC METABOLIC PANEL
Anion gap: 13 (ref 5–15)
BUN: 48 mg/dL — ABNORMAL HIGH (ref 8–23)
CO2: 28 mmol/L (ref 22–32)
Calcium: 9.5 mg/dL (ref 8.9–10.3)
Chloride: 87 mmol/L — ABNORMAL LOW (ref 98–111)
Creatinine, Ser: 1.85 mg/dL — ABNORMAL HIGH (ref 0.44–1.00)
GFR, Estimated: 28 mL/min — ABNORMAL LOW (ref >60)
Glucose, Bld: 205 mg/dL — ABNORMAL HIGH (ref 70–99)
Potassium: 4.8 mmol/L (ref 3.5–5.1)
Sodium: 128 mmol/L — ABNORMAL LOW (ref 135–145)

## 2021-07-31 MED ORDER — ALBUMIN HUMAN 25 % IV SOLN
12.5000 g | Freq: Two times a day (BID) | INTRAVENOUS | Status: DC
  Administered 2021-07-31 – 2021-08-14 (×29): 12.5 g via INTRAVENOUS
  Filled 2021-07-31 (×30): qty 50

## 2021-07-31 MED ORDER — POLYETHYLENE GLYCOL 3350 17 G PO PACK
17.0000 g | PACK | Freq: Every day | ORAL | Status: DC
  Administered 2021-07-31 – 2021-08-04 (×5): 17 g via ORAL
  Filled 2021-07-31 (×5): qty 1

## 2021-07-31 NOTE — Progress Notes (Signed)
Occupational Therapy Treatment Patient Details Name: SAINA WAAGE MRN: [id number] DOB: May 13, 1946 Today's Date: 07/31/2021   History of present illness Pt is a 75 y/o F admitted on 07/26/21 after presenting with c/o increasing SOB & edema. Pt reports she had chest x-ray with PCP showing recurrence of left-sided pleural effusion was in the process of being referred to pulmonology for thoracentesis but could not wait for the appointment due to worsening symptoms. Pulmonology was consulted & pt had thoracentesis with removal of 600 cc of fluid. Pt is being treated for acute on chronic diastolic CHF &  anasarca. PMH: HFpEF, permanent A-fib on eliquis, PAD, DM2, CKD stage 3B, lymphadema   OT comments  Pt seen for OT treatment on this date. Upon arrival to room, pt awake and sitting upright in recliner. Pt reporting that she has been walking to/from bathroom and performing standing grooming tasks this date, however endorses decreased activity tolerance compared to baseline. Pt currently requires SUPERVISION for standing grooming tasks and SUPERVISION for functional mobility of short household distances with RW. Pt able to walk 102ft with RW this date before reporting 7/10 on RPE scale and initiating seated rest break. Pt educated on energy conservation strategies including activity pacing, RPE scale, seated rest breaks, and PLB; pt verbalized understanding. Pt is making good progress toward goals and frequency changed to reflect pt's progress. Pt continues to benefit from skilled OT services to maximize return to PLOF and minimize risk of future falls, injury, caregiver burden, and readmission. Will continue to follow POC. Discharge recommendation remains appropriate.     Recommendations for follow up therapy are one component of a multi-disciplinary discharge planning process, led by the attending physician.  Recommendations may be updated based on patient status, additional functional criteria and  insurance authorization.    Follow Up Recommendations  Home health OT;Supervision - Intermittent    Equipment Recommendations  None recommended by OT       Precautions / Restrictions Precautions Precautions: Fall Restrictions Weight Bearing Restrictions: No       Mobility Bed Mobility               General bed mobility comments: not assessed, pt in recliner at beginning/end of session    Transfers Overall transfer level: Needs assistance Equipment used: Rolling walker (2 wheeled) Transfers: Sit to/from Stand Sit to Stand: Supervision              Balance Overall balance assessment: Needs assistance Sitting-balance support: Feet supported;No upper extremity supported Sitting balance-Leahy Scale: Good Sitting balance - Comments: Good sitting balance reaching within BOS   Standing balance support: Bilateral upper extremity supported;During functional activity Standing balance-Leahy Scale: Fair Standing balance comment: Supervision for walking 30 ft with b/l UE supported by RW                           ADL either performed or assessed with clinical judgement   ADL Overall ADL's : Needs assistance/impaired     Grooming: Wash/dry hands;Supervision/safety;Standing               Lower Body Dressing: Set up;Sitting/lateral leans Lower Body Dressing Details (indicate cue type and reason): to don/doff slip-on shoes while seated in recliner             Functional mobility during ADLs: Supervision/safety;Rolling walker (to walk ~65ft)        Cognition Arousal/Alertness: Awake/alert Behavior During Therapy: WFL for tasks assessed/performed;Flat affect Overall Cognitive  Status: Within Functional Limits for tasks assessed                                                General Comments HR 80s at rest, 110s following functional mobility. SpO2>92%    Pertinent Vitals/ Pain       Pain Assessment: Faces Faces Pain Scale:  Hurts a little bit Pain Location: BLE Pain Descriptors / Indicators: Discomfort Pain Intervention(s): Monitored during session;Limited activity within patient's tolerance;Repositioned         Frequency  Min 1X/week        Progress Toward Goals  OT Goals(current goals can now be found in the care plan section)  Progress towards OT goals: Progressing toward goals  Acute Rehab OT Goals Patient Stated Goal: to regain independence OT Goal Formulation: With patient Time For Goal Achievement: 08/12/21  Plan Discharge plan remains appropriate;Frequency needs to be updated       AM-PAC OT "6 Clicks" Daily Activity     Outcome Measure   Help from another person eating meals?: None Help from another person taking care of personal grooming?: A Little Help from another person toileting, which includes using toliet, bedpan, or urinal?: A Little Help from another person bathing (including washing, rinsing, drying)?: A Lot Help from another person to put on and taking off regular upper body clothing?: A Little Help from another person to put on and taking off regular lower body clothing?: A Lot 6 Click Score: 17    End of Session Equipment Utilized During Treatment: Rolling walker  OT Visit Diagnosis: Unsteadiness on feet (R26.81);Muscle weakness (generalized) (M62.81)   Activity Tolerance Patient tolerated treatment well   Patient Left in chair;with call bell/phone within reach;with nursing/sitter in room   Nurse Communication Mobility status        Time: 5732-2025 OT Time Calculation (min): 17 min  Charges: OT General Charges $OT Visit: 1 Visit OT Treatments $Therapeutic Activity: 8-22 mins  Fredirick Maudlin, OTR/L [place]

## 2021-07-31 NOTE — Plan of Care (Signed)
  Problem: Health Behavior/Discharge Planning: Goal: Ability to manage health-related needs will improve Outcome: Progressing   Problem: Clinical Measurements: Goal: Respiratory complications will improve Outcome: Progressing   Problem: Activity: Goal: Risk for activity intolerance will decrease Outcome: Progressing   

## 2021-07-31 NOTE — Progress Notes (Signed)
Pulmonary Medicine          Date: 07/31/2021,   MRN# [id number] Karen Farley 15-Nov-1945     AdmissionWeight: 105.7 kg                 CurrentWeight: 104.7 kg   Referring physician: Dr. Benny Lennert   CHIEF COMPLAINT:   Acute hypoxemic respiratory failure secondary to bilateral pleural effusions   HISTORY OF PRESENT ILLNESS   This is a pleasant 75 year old female with a medical history of diastolic CHF, atrial fibrillation on Eliquis, increased shortness of breath and edema on arrival.  Patient was recently hospitalized and relates that after discharge had worsening abdominal girth and lower extremity edema specifically over the last 2 weeks with notable pitting.  On arrival she had chest imaging done with x-ray showing left-sided pleural effusion.  She was referred to pulmonary in outpatient but clinically deteriorated and wanted to be seen prior to her appointment so came to hospital due to this reason.  She does have CKD and has been seen by nephrology on the last admission.  Was placed on torsemide metolazone and Aldactone at that time.  She was able to see nephrology on outpatient and this was reduced to 20 mg 3 times weekly.  On admission she had hypotension which was fluid responsive with an AKI on CKD chest x-ray again showing large left-sided pleural effusion.  PCCM consultation due to acute hypoxemic respiratory failure with pleural effusions.  07/29/21- patient improved post thoracentesis, she did PT today  07/30/21- patient is 2200cc UOP overnight but lower extermities are more swollen.  Reviewed need to reduce fluid intake patient shares she has been drinking freely and is not on restriction.  We advanced diet to 1200cc fluid restriction.  Her breathing is less labored after thoracentesis. Pleural fluid profile is monocyte predominant serous simple transudate indicative of chronic benign fluid.   07/31/21- no overnight events. Continue current diuretic and supportive  care  PAST MEDICAL HISTORY   Past Medical History:  Diagnosis Date   Chronic heart failure with preserved ejection fraction (HFpEF) ([facility])    a. 11/2019 Echo: EF 60-65%, no rwma, Nl RV size/fxn. Mod dil LA.   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)    DDD (degenerative disc disease), lumbar    Degenerative disc disease, lumbar    bulging and dengerated   Diabetes mellitus without complication (HCC)    Hyperlipidemia    Hypertension    Lymphedema    Morbid obesity (HCC)    Osteoarthritis of both knees    Osteopenia      SURGICAL HISTORY   Past Surgical History:  Procedure Laterality Date   CHOLECYSTECTOMY     DILATION AND CURETTAGE OF UTERUS     TEAR DUCT PROBING WITH STRABISMUS REPAIR Right    TONSILLECTOMY       FAMILY HISTORY   Family History  Problem Relation Age of Onset   Heart disease Mother        CHF   Osteoporosis Mother    Breast cancer Mother    Heart disease Father 35   Cancer Brother        Colon CA- 2002- Youngest brother   Parkinson's disease Brother        Younger Brother   Heart disease Brother        2 brothers   Cancer Brother    Breast cancer Maternal Grandmother      SOCIAL HISTORY  Social History   Tobacco Use   Smoking status: Never   Smokeless tobacco: Never  Vaping Use   Vaping Use: Never used  Substance Use Topics   Alcohol use: Not Currently    Alcohol/week: 0.0 standard drinks    Comment: on rare occasion   Drug use: No     MEDICATIONS    Home Medication:    Current Medication:  Current Facility-Administered Medications:    acetaminophen (TYLENOL) tablet 650 mg, 650 mg, Oral, Q4H PRN, Mansy, Jan A, MD, 650 mg at 07/30/21 9326   albumin human 25 % solution 12.5 g, 12.5 g, Intravenous, BID, Breeze, Shantelle, NP, Last Rate: 60 mL/hr at 07/31/21 1151, 12.5 g at 07/31/21 1151   furosemide (LASIX) injection 40 mg, 40 mg, Intravenous, BID, Breeze, Shantelle, NP, 40 mg at 07/31/21 0819   hydrocortisone  sodium succinate (SOLU-CORTEF) 100 MG injection 100 mg, 100 mg, Intravenous, Q8H, Estefanny Moler, MD, 100 mg at 07/31/21 0820   latanoprost (XALATAN) 0.005 % ophthalmic solution 1 drop, 1 drop, Both Eyes, QHS, Tu, Ching T, DO, 1 drop at 07/30/21 2136   metoprolol succinate (TOPROL-XL) 24 hr tablet 12.5 mg, 12.5 mg, Oral, Daily, Swayze, Ava, DO, 12.5 mg at 07/31/21 0820   midodrine (PROAMATINE) tablet 10 mg, 10 mg, Oral, TID WC, Swayze, Ava, DO, 10 mg at 07/31/21 1144   polyethylene glycol (MIRALAX / GLYCOLAX) packet 17 g, 17 g, Oral, Daily, Breeze, Shantelle, NP, 17 g at 07/31/21 1157   potassium chloride SA (KLOR-CON) CR tablet 40 mEq, 40 mEq, Oral, Daily, Tu, Ching T, DO, 40 mEq at 07/31/21 0820    ALLERGIES   Patient has no known allergies.     REVIEW OF SYSTEMS    Review of Systems:  Gen:  Denies  fever, sweats, chills weigh loss  HEENT: Denies blurred vision, double vision, ear pain, eye pain, hearing loss, nose bleeds, sore throat Cardiac:  No dizziness, chest pain or heaviness, chest tightness,edema Resp:   Denies cough or sputum porduction, shortness of breath,wheezing, hemoptysis,  Gi: Denies swallowing difficulty, stomach pain, nausea or vomiting, diarrhea, constipation, bowel incontinence Gu:  Denies bladder incontinence, burning urine Ext:   Denies Joint pain, stiffness or swelling Skin: Denies  skin rash, easy bruising or bleeding or hives Endoc:  Denies polyuria, polydipsia , polyphagia or weight change Psych:   Denies depression, insomnia or hallucinations   Other:  All other systems negative   VS: BP 110/78 (BP Location: Right Arm)   Pulse 82   Temp 97.6 F (36.4 C) (Oral)   Resp 20   Ht 5\' 2"  (1.575 m)   Wt 104.7 kg   SpO2 96%   BMI 42.21 kg/m      PHYSICAL EXAM    GENERAL:NAD, no fevers, chills, no weakness no fatigue HEAD: Normocephalic, atraumatic.  EYES: Pupils equal, round, reactive to light. Extraocular muscles intact. No scleral icterus.   MOUTH: Moist mucosal membrane. Dentition intact. No abscess noted.  EAR, NOSE, THROAT: Clear without exudates. No external lesions.  NECK: Supple. No thyromegaly. No nodules. No JVD.  PULMONARY: Decreased breath sounds bilaterally at bases.  CARDIOVASCULAR: S1 and S2. Regular rate and rhythm. No murmurs, rubs, or gallops. No edema. Pedal pulses 2+ bilaterally.  GASTROINTESTINAL: Soft, nontender, nondistended. No masses. Positive bowel sounds. No hepatosplenomegaly.  MUSCULOSKELETAL: No swelling, clubbing, or edema. Range of motion full in all extremities.  NEUROLOGIC: Cranial nerves II through XII are intact. No gross focal neurological deficits. Sensation intact. Reflexes intact.  SKIN: No ulceration, lesions, rashes, or cyanosis. Skin warm and dry. Turgor intact.  PSYCHIATRIC: Mood, affect within normal limits. The patient is awake, alert and oriented x 3. Insight, judgment intact.       IMAGING    DG Chest 2 View  Result Date: 07/20/2021 CLINICAL DATA:  Patient is here to follow up for pleural effusion. Patient complains of cough since April and has fluid retention. Patient denies asthma and is a non smoker, but has a history of a A-fib and has no other lung/heart issues or surgeries. EXAM: CHEST - 2 VIEW COMPARISON:  Radiography of the chest dated 05/21/2021. Radiography of the chest dated 05/20/2021. FINDINGS: Persistent enlargement of the cardiac silhouette. Atherosclerotic calcification of the aortic knob. Moderate-large left-sided pleural effusion with left basilar atelectasis. Right lung is clear. No pneumothorax. IMPRESSION: Persistent moderate-large left-sided pleural effusion with left basilar atelectasis. Electronically Signed   By: Davina Poke D.O.   On: 07/17/2021 11:03   DG Chest Port 1 View  Result Date: 07/28/2021 CLINICAL DATA:  Post left-sided thoracentesis. EXAM: PORTABLE CHEST 1 VIEW COMPARISON:  Earlier same day; 07/26/2021 FINDINGS: Grossly unchanged enlarged  cardiac silhouette and mediastinal contours with atherosclerotic plaque within the aortic arch. Interval reduction in persistent trace left-sided effusion post thoracentesis. Improved aeration of left lung base with persistent partial obscuration of the left heart border secondary to left basilar heterogeneous/consolidative opacities. The right hemithorax remains well aerated. Mild point is congestion without frank evidence of edema. No pneumothorax. No acute osseous abnormalities. IMPRESSION: 1. No evidence of complication following left-sided thoracentesis. Specifically, no pneumothorax. 2. Improved aeration of left lung base with persistent left basilar opacities, atelectasis versus infiltrate. 3. Otherwise, similar findings of cardiomegaly and pulmonary venous congestion without frank evidence of edema. 4.  Aortic Atherosclerosis (ICD10-I70.0). Electronically Signed   By: Sandi Mariscal M.D.   On: 07/28/2021 14:04   DG Chest Port 1 View  Result Date: 07/28/2021 CLINICAL DATA:  75 year old female with history of increasing shortness of breath with low oxygen saturations. EXAM: PORTABLE CHEST 1 VIEW COMPARISON:  Chest x-ray 07/26/2021. FINDINGS: Persistent opacification throughout the left mid to lower hemithorax. Right lung is clear. No definite right pleural effusion. No pneumothorax. Cardiac silhouette is obscured. The patient is rotated to the left on today's exam, resulting in distortion of the mediastinal contours and reduced diagnostic sensitivity and specificity for mediastinal pathology. IMPRESSION: 1. Persistent opacification in the left mid to lower hemithorax compatible with areas of atelectasis and/or consolidation, likely with superimposed large left pleural effusion. 2. Aortic atherosclerosis. Electronically Signed   By: Vinnie Langton M.D.   On: 07/28/2021 10:32   DG Chest Portable 1 View  Result Date: 07/26/2021 CLINICAL DATA:  Shortness of breath EXAM: PORTABLE CHEST 1 VIEW COMPARISON:   07/16/2021 FINDINGS: Large left-sided pleural effusion slightly increased. Probable trace right effusion. Cardiomegaly with vascular congestion. Persistent consolidation in the left lower lung. IMPRESSION: Large left-sided pleural effusion with airspace disease in the left mid to lower lung. Trace right pleural effusion. Cardiomegaly with vascular congestion Electronically Signed   By: Donavan Foil M.D.   On: 07/26/2021 18:10   US THORACENTESIS ASP PLEURAL SPACE W/IMG GUIDE  Result Date: 07/28/2021 INDICATION: Symptomatic left-sided pleural effusion. Please from ultrasound-guided thoracentesis for diagnostic and therapeutic purposes. EXAM: US THORACENTESIS ASP PLEURAL SPACE W/IMG GUIDE COMPARISON:  Chest radiograph-earlier same day; 05/20/2021; ultrasound-guided left-sided thoracentesis yielding 400 cc pleural fluid. MEDICATIONS: None. COMPLICATIONS: None immediate. TECHNIQUE: Informed written consent was obtained from the  patient after a discussion of the risks, benefits and alternatives to treatment. A timeout was performed prior to the initiation of the procedure. Initial ultrasound scanning demonstrates a small anechoic left-sided pleural effusion. The lower chest was prepped and draped in the usual sterile fashion. 1% lidocaine was used for local anesthesia. Under direct ultrasound guidance, an 8 Fr Safe-T-Centesis catheter was introduced. The thoracentesis was performed. The catheter was removed and a dressing was applied. The patient tolerated the procedure well without immediate post procedural complication. The patient was escorted to have an upright chest radiograph. FINDINGS: A total of approximately 600 cc of serous fluid was removed. Requested samples were sent to the laboratory. IMPRESSION: Successful ultrasound-guided left sided thoracentesis yielding 600 cc of pleural fluid. Electronically Signed   By: Sandi Mariscal M.D.   On: 07/28/2021 14:06      ASSESSMENT/PLAN   Large left pleural  effusion -likely due to CHF/CKD - patient slow to diurese - will order thoracentesis with fluid studies.  Previously patient had thoracentesis and shares she felt better immediately.  -serial CXR  -will reviewe fluid studies post thoracentesis, she does not appear acutely toxic from pneumonia I dought this will be empyema but it could be exudate due to ongoing diuresis -Monocyte predominant transudate - benign fluid likely due to CHF/CKD   CKD   Nephrology to guide appropriate diuresis -reviewed with nephrology - adding solucortef and albumin due to persistent transient hypotension despite 10 TID of midodrine   Thank you for allowing me to participate in the care of this patient.   Patient/Family are satisfied with care plan and all questions have been answered.  This document was prepared using Dragon voice recognition software and may include unintentional dictation errors.     Ottie Glazier, M.D.  Division of [facility]

## 2021-07-31 NOTE — Progress Notes (Signed)
PROGRESS NOTE  Karen Farley LTJ:[id number] DOB: [birth year] DOA: 07/26/2021 PCP: Valerie Roys, DO  Brief History    Karen Farley is a 75 y.o. female with medical history significant for HFpEF, permanent atrial fibrillation on Eliquis, PAD, type 2 diabetes, CKD stage IIIb, lymphedema who presents with concerns of increasing shortness of breath and edema.   Patient notes that since her last hospitalization in August she has been having increasing abdominal distention.  Had increasing lower extremity edema on top of her feet for the past 2 weeks.  She also notes worsening dyspnea on exertion for the past several weeks and had chest x-ray with PCP showing recurrence of left-sided pleural effusion.  She was in the process of being referred to pulmonology for thoracentesis but could not wait for the appointment due to worsening symptoms and decided to present to the ED.   Patient last admitted on 7/28-8/7 with Acute on chronic diastolic CHF, left-sided pleural effusion and acute on chronic CKD.  Both cardiology and nephrology was consulted and she was treated with IV diuretic. Had thoracentesis on 8/1 with 400 cc removed likely transudative fluid based on limited lab work  she had metolazone, torsemide and Aldactone held at discharge. She had follow-up with nephrology on 8/11 and had restarted torsemide 20 mg 3 times per week and metolazone 2.5 mg once a week.  Later switched Torsemide every other day since she had discussion with nephrology that it would help make it easier for  her. States she still has good urine output with changes in regimen.  However she has been monitoring her weight daily and has noted a 5lb  weight gain from 218 to 223lb since August. Last dose of Torsemide yesterday.    ED Course: She was afebrile, hypotensive down to 84/62 initially which improved with 500 cc normal saline fluid.  Sodium of 128, K of 2.6, chloride of 79, creatinine of 1.78 around her baseline, BG  195.  Magnesium of 2.2.  BNP of 154.  Normal AST and ALT.  T bili mildly elevated at 2.6. Chest x-ray showing large left-sided pleural effusion.  Pulmonology consulted for effusion. Patient has gone for thoracentesis with removal of 600 cc of fluid which will be sent for study.  The patient is complaining of increasing edema and swelling of lower extremities bilaterally.   Consultants  Pulmonology Heart Failure Nephrology  Procedures  None  Antibiotics   Anti-infectives (From admission, onward)    None       Subjective  The patient is sitting up at bedside. Complaining of increasing swelling and edema bilaterally.  Objective   Vitals:  Vitals:   07/31/21 0727 07/31/21 1158  BP: 113/75 110/78  Pulse: 91 82  Resp: 16 20  Temp: 97.9 F (36.6 C) 97.6 F (36.4 C)  SpO2: 98% 96%    Exam:  Constitutional:  Awake, alert, and oriented x 3.  Respiratory:  CTA bilaterally, no w/r/r.  Respiratory effort normal. No retractions or accessory muscle use Diminished breath sounds and dullness to percussion on right base. Cardiovascular:  RRR, no m/r/g Worsening lower extremity edema particularly on the dorsum of feet with erythema and blistering/weeping of lower extremities bilaterally.  Normal pedal pulses Abdomen:  Abdomen appears normal; no tenderness or masses No hernias No HSM Musculoskeletal:  Digits/nails BUE: no clubbing, cyanosis, petechiae, infection exam of joints, bones, muscles of at least one of following: head/neck, RUE, LUE, RLE, LLE   Skin:  No rashes, lesions, ulcers palpation  of skin: no induration or nodules Neurologic:  CN 2-12 intact Sensation all 4 extremities intact Psychiatric:  Mental status Mood, affect appropriate Orientation to person, place, time  judgment and insight appear intact    I have personally reviewed the following:   Today's Data  Vitals  Lab Data  CBC, BMP  Imaging  CXR  Cardiology Data  EKG  Scheduled  Meds:  furosemide  40 mg Intravenous BID   hydrocortisone sod succinate (SOLU-CORTEF) inj  100 mg Intravenous Q8H   latanoprost  1 drop Both Eyes QHS   metoprolol succinate  12.5 mg Oral Daily   midodrine  10 mg Oral TID WC   polyethylene glycol  17 g Oral Daily   potassium chloride  40 mEq Oral Daily   Continuous Infusions:  albumin human 12.5 g (07/31/21 1151)    Principal Problem:   Acute on chronic diastolic CHF (congestive heart failure) (HCC) Active Problems:   Type 2 diabetes mellitus with renal complication (HCC)   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 diabetes mellitus (HCC)   Atrial fibrillation (HCC)   Hypotension   Pleural effusion   LOS: 5 days  Acute on chronic diastolic CHF Anasarca: Worsening edema of lower extremities bilaterally. -Pt on torsemide 20 mg 3 times per week and metolazone 2.5 mg once a week here with large pleural effusion, anasarca and increasing LE edema -Very complex heart failure and CKD patient. Her creatinine has been sensitive to increasing diuresis in the past. Also very susceptible to hypovolemia and hypotension to increasing diuresis.  -needs cardiology and nephrology consult. There was discussion of referring for placement of CardioMEMS by cardiology in the past which needs to be re-addressed.  -recent echo on 04/2021 with EF of 60-65%  -start IV Lasix 40 mg IV bid, but diuresis is limited by hypotension. Patient may require midodrine to bring pressures up so that more effective diuresis may be obtained.  -Midodrine 10 mg tid, albumin, and solucortef added to increase blood pressure enough to allow for maximal diuresis.  - I appreciate the help of PCCM and Nephrology in this patient.   Left pleural effusion secondary to diastolic CHF Consult pulm for thoracentesis if unable to diurese off. 600 cc removed today. LIkely transudative.   Hypotension BP low normal. Monitor. This is limiting the ability to diurese this patient. Midodrine 10 mg tid, albumin,  and solucortef added to increase blood pressure enough to allow for maximal diuresis. Metoprolol XL has been discontinued.   Hypokalemia Resolved. Monitor. Likely due to diuretics Daily oral supplementation   Hyponatremia Sodium of 127 likely from diuresis improved to 128. Fluid restriction discontinued.   CKD stage 3b -creatinine stable at 1.68 close to baseline around 1.4-1.7. I appreciate nephrology's help.   Permanent atrial fibrillation Last Eliquis dose this morning.  Held   Type 2 diabetes Diet controlled   I have seen and examined this patient myself. I have spent 32 minutes in her evaluation and care.   DVT prophylaxis:.on Eliquis Code Status: Full Family Communication: Plan discussed with patient and husband at bedside  Disposition Plan: Home with at least 2 midnight stays  Consults called:  Admission status: inpatient   Level of care: Progressive Cardiac   Status is: Inpatient   Remains inpatient appropriate because:Inpatient level of care appropriate due to severity of illness   Dispo: The patient is from: Home              Anticipated d/c is to: Home  Patient currently is not medically stable to d/c.              Difficult to place patient No   Kassidie Hendriks, DO [facility] 07/31/2021 2:47.

## 2021-07-31 NOTE — Progress Notes (Signed)
Physical Therapy Treatment Patient Details Name: Karen Farley MRN: [id number] DOB: 18-Sep-1946 Today's Date: 07/31/2021   History of Present Illness Pt is a 75 y/o F admitted on 07/26/21 after presenting with c/o increasing SOB & edema. Pt reports she had chest x-ray with PCP showing recurrence of left-sided pleural effusion was in the process of being referred to pulmonology for thoracentesis but could not wait for the appointment due to worsening symptoms. Pulmonology was consulted & pt had thoracentesis with removal of 600 cc of fluid. Pt is being treated for acute on chronic diastolic CHF &  anasarca. PMH: HFpEF, permanent A-fib on eliquis, PAD, DM2, CKD stage 3B, lymphadema    PT Comments    Pt received seated in recliner chair and agreeable to only seated exercises. Pt declining any ambulation during this visit due to increased shortness of breath when ambulating with OT earlier in day. Pt able to complete exercises listed below without complaints and educated on importance continued exercise to decreased B LE swelling. Will continue to work with patient during hospitalization to promote mobility and improve activity tolerance.    Recommendations for follow up therapy are one component of a multi-disciplinary discharge planning process, led by the attending physician.  Recommendations may be updated based on patient status, additional functional criteria and insurance authorization.  Follow Up Recommendations  Home health PT;Supervision for mobility/OOB     Equipment Recommendations  Rolling walker with 5" wheels    Recommendations for Other Services       Precautions / Restrictions Precautions Precautions: Fall Restrictions Weight Bearing Restrictions: No     Mobility  Bed Mobility               General bed mobility comments: not assessed, pt in recliner chair upon entering    Transfers Overall transfer level: Needs assistance Equipment used: Rolling walker (2  wheeled) Transfers: Sit to/from Stand Sit to Stand: Supervision            Ambulation/Gait             General Gait Details: Pt deferes ambulation at this time. Pt reports that she ambulated earlier with OT and had increased shortness of breath and does not want to do that again today   Stairs             Wheelchair Mobility    Modified Rankin (Stroke Patients Only)       Balance Overall balance assessment: Needs assistance Sitting-balance support: Feet supported;No upper extremity supported Sitting balance-Leahy Scale: Good     Standing balance support: No upper extremity supported;During functional activity Standing balance-Leahy Scale: Fair Standing balance comment: SPV without UE support for static standing                            Cognition Arousal/Alertness: Awake/alert Behavior During Therapy: WFL for tasks assessed/performed;Flat affect Overall Cognitive Status: Within Functional Limits for tasks assessed                                        Exercises Other Exercises Other Exercises: Seated therex: LAQ x 20 bilat, ankle pumps x 20 bilaterally, hip flexion x10 bilaterally. Sit to stands 3 x 5 from recliner chair with cues to use 1 UE assist instead of 2 to increase LE strengthening    General Comments  Pertinent Vitals/Pain Pain Assessment: No/denies pain    Home Living                      Prior Function            PT Goals (current goals can now be found in the care plan section) Acute Rehab PT Goals Patient Stated Goal: to regain independence PT Goal Formulation: With patient Time For Goal Achievement: 08/12/21 Potential to Achieve Goals: Good Progress towards PT goals: Progressing toward goals    Frequency    Min 2X/week      PT Plan Current plan remains appropriate    Co-evaluation              AM-PAC PT "6 Clicks" Mobility   Outcome Measure  Help needed turning  from your back to your side while in a flat bed without using bedrails?: None Help needed moving from lying on your back to sitting on the side of a flat bed without using bedrails?: A Little Help needed moving to and from a bed to a chair (including a wheelchair)?: A Little Help needed standing up from a chair using your arms (e.g., wheelchair or bedside chair)?: None Help needed to walk in hospital room?: A Little Help needed climbing 3-5 steps with a railing? : A Lot 6 Click Score: 19    End of Session   Activity Tolerance: Patient limited by fatigue (Pt somewhat self limiting) Patient left: in chair;with call bell/phone within reach;with chair alarm set;with family/visitor present Nurse Communication: Mobility status PT Visit Diagnosis: Muscle weakness (generalized) (M62.81);Difficulty in walking, not elsewhere classified (R26.2);Unsteadiness on feet (R26.81)     Time: 1779-3903 PT Time Calculation (min) (ACUTE ONLY): 20 min  Charges:  $Therapeutic Exercise: 8-22 mins                     Andrey Campanile, SPT    Andrey Campanile 07/31/2021, 4:45 PM

## 2021-07-31 NOTE — Progress Notes (Signed)
[facility]  ROUNDING NOTE   Subjective:   [name] was admitted to [facility] on 07/26/2021 for Morbid obesity ([place]) [E66.01] DOE (dyspnea on exertion) [R06.09] Recurrent left pleural effusion [J90] Hypotension [I95.9] Type 2 diabetes mellitus without complication, without long-term current use of insulin ([place]) [E11.9] Atrial fibrillation, unspecified type ([place]) [I48.91] Chronic congestive heart failure, unspecified heart failure type [facility]) [I50.9]  Patient was last seen by my partner, Dr. Candiss Norse, on 05/31/21. Where torsemide was restarted and to continue metolazone.   Patient admitted with shortness of breath and increasing peripheral edema. She was then started on IV furosemide. Left thoracentesis done yesterday on 07/28/21  671mL of pleural fluid.   Patient seen sitting at bedside eating breakfast States nursing wrapped legs yesterday Denies shortness of breath Patient seen later sitting in recliner with feet elevated   Objective:  Vital signs in last 24 hours:  Temp:  [97.6 F (36.4 C)-98.1 F (36.7 C)] 97.6 F (36.4 C) (10/11 1158) Pulse Rate:  [76-100] 82 (10/11 1158) Resp:  [16-20] 20 (10/11 1158) BP: (100-134)/(70-81) 110/78 (10/11 1158) SpO2:  [93 %-100 %] 96 % (10/11 1158) Weight:  [104.7 kg] 104.7 kg (10/11 0414)  Weight change: 0.726 kg Filed Weights   07/29/21 0600 07/30/21 0326 07/31/21 0414  Weight: 103.4 kg 104 kg 104.7 kg    Intake/Output: I/O last 3 completed shifts: In: 1119.7 [P.O.:1080; IV Piggyback:39.7] Out: 1700 [Urine:1700]   Intake/Output this shift:  Total I/O In: 720 [P.O.:720] Out: -   Physical Exam: General: NAD, sitting up in chair  Head: Normocephalic, atraumatic. Moist oral mucosal membranes  Eyes: Anicteric  Lungs:  Clear bilaterally, normal effort  Heart: Regular rate and rhythm  Abdomen:  Soft, nontender  Extremities:  ++ peripheral edema.  Neurologic: Nonfocal, moving all four extremities  Skin:  Bilateral lower extremity erythema and fluid filled blisters, bandaged        Basic Metabolic Panel: Recent Labs  Lab 07/26/21 1708 07/27/21 0637 07/27/21 1926 07/28/21 0753 07/28/21 1828 07/29/21 0425 07/31/21 0815  NA 128*   < > 126* 129* 127* 129* 128*  K 2.6*   < > 3.5 2.8* 3.5 3.5 4.8  CL 79*   < > 82* 82* 86* 87* 87*  CO2 34*   < > 32 33* 32 32 28  GLUCOSE 195*   < > 201* 138* 229* 152* 205*  BUN 65*   < > 62* 59* 55* 51* 48*  CREATININE 1.78*   < > 1.85* 1.68* 1.56* 1.52* 1.85*  CALCIUM 9.4   < > 9.1 9.1 9.3 9.3 9.5  MG 2.2  --   --   --   --   --   --    < > = values in this interval not displayed.     Liver Function Tests: Recent Labs  Lab 07/26/21 1708  AST 27  ALT 16  ALKPHOS 68  BILITOT 2.3*  PROT 6.1*  ALBUMIN 3.2*    No results for input(s): LIPASE, AMYLASE in the last 168 hours. No results for input(s): AMMONIA in the last 168 hours.  CBC: Recent Labs  Lab 07/26/21 1708 07/28/21 0753  WBC 8.1 6.4  NEUTROABS  --  4.8  HGB 13.6 13.2  HCT 42.5 42.2  MCV 77.1* 75.8*  PLT 341 354     Cardiac Enzymes: No results for input(s): CKTOTAL, CKMB, CKMBINDEX, TROPONINI in the last 168 hours.  BNP: Invalid input(s): POCBNP  CBG: Recent Labs  Lab  07/28/21 2153 07/29/21 2102  GLUCAP 202* 172*     Microbiology: Results for orders placed or performed during the hospital encounter of 07/26/21  Resp Panel by RT-PCR (Flu A&B, Covid) Nasopharyngeal Swab     Status: None   Collection Time: 07/26/21  7:19 PM   Specimen: Nasopharyngeal Swab; Nasopharyngeal(NP) swabs in vial transport medium  Result Value Ref Range Status   SARS Coronavirus 2 by RT PCR NEGATIVE NEGATIVE Final    Comment: (NOTE) SARS-CoV-2 target nucleic acids are NOT DETECTED.  The SARS-CoV-2 RNA is generally detectable in upper respiratory specimens during the acute phase of infection. The lowest concentration of SARS-CoV-2 viral copies this assay can detect is 138 copies/mL. A  negative result does not preclude SARS-Cov-2 infection and should not be used as the sole basis for treatment or other patient management decisions. A negative result may occur with  improper specimen collection/handling, submission of specimen other than nasopharyngeal swab, presence of viral mutation(s) within the areas targeted by this assay, and inadequate number of viral copies(<138 copies/mL). A negative result must be combined with clinical observations, patient history, and epidemiological information. The expected result is Negative.  Fact Sheet for Patients:  EntrepreneurPulse.com.au  Fact Sheet for Healthcare Providers:  IncredibleEmployment.be  This test is no t yet approved or cleared by the [place] FDA and  has been authorized for detection and/or diagnosis of SARS-CoV-2 by FDA under an Emergency Use Authorization (EUA). This EUA will remain  in effect (meaning this test can be used) for the duration of the COVID-19 declaration under Section 564(b)(1) of the Act, 21 U.S.C.section 360bbb-3(b)(1), unless the authorization is terminated  or revoked sooner.       Influenza A by PCR NEGATIVE NEGATIVE Final   Influenza B by PCR NEGATIVE NEGATIVE Final    Comment: (NOTE) The Xpert Xpress SARS-CoV-2/FLU/RSV plus assay is intended as an aid in the diagnosis of influenza from Nasopharyngeal swab specimens and should not be used as a sole basis for treatment. Nasal washings and aspirates are unacceptable for Xpert Xpress SARS-CoV-2/FLU/RSV testing.  Fact Sheet for Patients: EntrepreneurPulse.com.au  Fact Sheet for Healthcare Providers: IncredibleEmployment.be  This test is not yet approved or cleared by the [place] FDA and has been authorized for detection and/or diagnosis of SARS-CoV-2 by FDA under an Emergency Use Authorization (EUA). This EUA will remain in effect (meaning this test can  be used) for the duration of the COVID-19 declaration under Section 564(b)(1) of the Act, 21 U.S.C. section 360bbb-3(b)(1), unless the authorization is terminated or revoked.  Performed at [facility], [address] [place], [place] [address]   Body fluid culture w Gram Stain     Status: None (Preliminary result)   Collection Time: 07/28/21  1:53 PM   Specimen: PATH Cytology Pleural fluid  Result Value Ref Range Status   Specimen Description   Final    PLEURAL Performed at [facility], [address], [place], [place] [address]    Special Requests   Final    NONE Performed at [facility], [address] [place], [place] [address]    Gram Stain   Final    NO SQUAMOUS EPITHELIAL CELLS SEEN FEW WBC SEEN NO ORGANISMS SEEN    Culture   Final    NO GROWTH 3 DAYS Performed at [facility], [place] [address], [place],  [address]    Report Status PENDING  Incomplete    Coagulation Studies: No results for input(s):  LABPROT, INR in the last 72 hours.  Urinalysis: No results for input(s): COLORURINE, LABSPEC, PHURINE, GLUCOSEU, HGBUR, BILIRUBINUR, KETONESUR, PROTEINUR, UROBILINOGEN, NITRITE, LEUKOCYTESUR in the last 72 hours.  Invalid input(s): APPERANCEUR    Imaging: No results found.   Medications:    albumin human 12.5 g (07/31/21 1151)    furosemide  40 mg Intravenous BID   hydrocortisone sod succinate (SOLU-CORTEF) inj  100 mg Intravenous Q8H   latanoprost  1 drop Both Eyes QHS   metoprolol succinate  12.5 mg Oral Daily   midodrine  10 mg Oral TID WC   polyethylene glycol  17 g Oral Daily   potassium chloride  40 mEq Oral Daily     Assessment/ Plan:  [name] is a 75 y.o. white female with diastolic congestive heart failure, hypertension, hyperlipidemia, lymphedema, atrial fibrillation, peripheral vascular disease who is admitted to [facility] on 07/26/2021 for Morbid obesity ([place]) [E66.01] DOE (dyspnea on  exertion) [R06.09] Recurrent left pleural effusion [J90] Hypotension [I95.9] Type 2 diabetes mellitus without complication, without long-term current use of insulin ([place]) [E11.9] Atrial fibrillation, unspecified type ([place]) [I48.91] Chronic congestive heart failure, unspecified heart failure type ([place]) [I50.9]  Acute kidney injury on chronic kidney disease stage IIIB: with baseline creatinine of 1.66, GFR of 36 on 07/13/21. History of bland urine. 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 Hold  benazepril and spironolactone.  - Continue IV Furosemide 40mg  twice daily - Adjusted albumin order to Albumin 12.5% twice a day to be timed with Lasix to optimize fluid removal. -Will order low potassium diet and Miralax daily for bowel regimen -Will consider increasing Metolazone to twice weekly at discharge.  Hypotension: on metoprolol, furosemide and midodrine. BP improved 110/78. Maxed out on midodrine dose. Other options included decreasing dose of metoprolol.   Acute exacerbation of diastolic congestive heart failure:  - IV Furosemide - fluid restriction  Hyponatremia: with hypervolumia.  - Continue fluid restriction.  - metolazone as above.   5. Left pleural effusion: Thoracentesis on 07/28/21 with 672ml removed. Solucortef ordered by Pulmonology.    LOS: [place] 10/11/20222:31 PM

## 2021-08-01 ENCOUNTER — Inpatient Hospital Stay: Payer: Medicare PPO

## 2021-08-01 DIAGNOSIS — I5033 Acute on chronic diastolic (congestive) heart failure: Secondary | ICD-10-CM | POA: Diagnosis not present

## 2021-08-01 LAB — CBC WITH DIFFERENTIAL/PLATELET
Abs Immature Granulocytes: 0.07 10*3/uL (ref 0.00–0.07)
Basophils Absolute: 0 10*3/uL (ref 0.0–0.1)
Basophils Relative: 0 %
Eosinophils Absolute: 0 10*3/uL (ref 0.0–0.5)
Eosinophils Relative: 0 %
HCT: 41.9 % (ref 36.0–46.0)
Hemoglobin: 13.6 g/dL (ref 12.0–15.0)
Immature Granulocytes: 1 %
Lymphocytes Relative: 5 %
Lymphs Abs: 0.6 10*3/uL — ABNORMAL LOW (ref 0.7–4.0)
MCH: 24.6 pg — ABNORMAL LOW (ref 26.0–34.0)
MCHC: 32.5 g/dL (ref 30.0–36.0)
MCV: 75.9 fL — ABNORMAL LOW (ref 80.0–100.0)
Monocytes Absolute: 0.7 10*3/uL (ref 0.1–1.0)
Monocytes Relative: 7 %
Neutro Abs: 9.9 10*3/uL — ABNORMAL HIGH (ref 1.7–7.7)
Neutrophils Relative %: 87 %
Platelets: 398 10*3/uL (ref 150–400)
RBC: 5.52 MIL/uL — ABNORMAL HIGH (ref 3.87–5.11)
RDW: 18.7 % — ABNORMAL HIGH (ref 11.5–15.5)
WBC: 11.3 10*3/uL — ABNORMAL HIGH (ref 4.0–10.5)
nRBC: 0 % (ref 0.0–0.2)

## 2021-08-01 LAB — BASIC METABOLIC PANEL
Anion gap: 15 (ref 5–15)
BUN: 54 mg/dL — ABNORMAL HIGH (ref 8–23)
CO2: 22 mmol/L (ref 22–32)
Calcium: 9.3 mg/dL (ref 8.9–10.3)
Chloride: 91 mmol/L — ABNORMAL LOW (ref 98–111)
Creatinine, Ser: 1.94 mg/dL — ABNORMAL HIGH (ref 0.44–1.00)
GFR, Estimated: 27 mL/min — ABNORMAL LOW (ref >60)
Glucose, Bld: 227 mg/dL — ABNORMAL HIGH (ref 70–99)
Potassium: 5.1 mmol/L (ref 3.5–5.1)
Sodium: 128 mmol/L — ABNORMAL LOW (ref 135–145)

## 2021-08-01 LAB — BODY FLUID CELL COUNT WITH DIFFERENTIAL
Eos, Fluid: 0 %
Lymphs, Fluid: 61 %
Monocyte-Macrophage-Serous Fluid: 23 %
Neutrophil Count, Fluid: 16 %
Other Cells, Fluid: 0 %
Total Nucleated Cell Count, Fluid: 621 cu mm

## 2021-08-01 LAB — VITAMIN D 25 HYDROXY (VIT D DEFICIENCY, FRACTURES): Vit D, 25-Hydroxy: 126.28 ng/mL — ABNORMAL HIGH (ref 30–100)

## 2021-08-01 LAB — VITAMIN B12: Vitamin B-12: 284 pg/mL (ref 180–914)

## 2021-08-01 LAB — FOLATE: Folate: 8.3 ng/mL (ref >5.9)

## 2021-08-01 LAB — PROTEIN, PLEURAL OR PERITONEAL FLUID: Total protein, fluid: <3 g/dL

## 2021-08-01 LAB — BODY FLUID CULTURE W GRAM STAIN
Culture: NO GROWTH
Gram Stain: NONE SEEN

## 2021-08-01 LAB — ALBUMIN, PLEURAL OR PERITONEAL FLUID: Albumin, Fluid: 1.8 g/dL

## 2021-08-01 LAB — GLUCOSE, CAPILLARY
Glucose-Capillary: 328 mg/dL — ABNORMAL HIGH (ref 70–99)
Glucose-Capillary: 290 mg/dL — ABNORMAL HIGH (ref 70–99)

## 2021-08-01 LAB — IRON AND TIBC
Iron: 19 ug/dL — ABNORMAL LOW (ref 28–170)
Saturation Ratios: 4 % — ABNORMAL LOW (ref 10.4–31.8)
TIBC: 484 ug/dL — ABNORMAL HIGH (ref 250–450)
UIBC: 465 ug/dL

## 2021-08-01 LAB — TSH: TSH: 0.902 u[IU]/mL (ref 0.350–4.500)

## 2021-08-01 MED ORDER — INSULIN ASPART 100 UNIT/ML IJ SOLN
0.0000 [IU] | Freq: Three times a day (TID) | INTRAMUSCULAR | Status: DC
  Administered 2021-08-01: 7 [IU] via SUBCUTANEOUS
  Administered 2021-08-02: 3 [IU] via SUBCUTANEOUS
  Administered 2021-08-02: 7 [IU] via SUBCUTANEOUS
  Administered 2021-08-02: 3 [IU] via SUBCUTANEOUS
  Administered 2021-08-03: 7 [IU] via SUBCUTANEOUS
  Administered 2021-08-03 – 2021-08-04 (×3): 5 [IU] via SUBCUTANEOUS
  Administered 2021-08-04 (×2): 3 [IU] via SUBCUTANEOUS
  Administered 2021-08-05: 1 [IU] via SUBCUTANEOUS
  Administered 2021-08-05: 3 [IU] via SUBCUTANEOUS
  Administered 2021-08-06: 1 [IU] via SUBCUTANEOUS
  Administered 2021-08-06: 3 [IU] via SUBCUTANEOUS
  Administered 2021-08-06: 1 [IU] via SUBCUTANEOUS
  Administered 2021-08-07 – 2021-08-09 (×8): 2 [IU] via SUBCUTANEOUS
  Administered 2021-08-09: 1 [IU] via SUBCUTANEOUS
  Administered 2021-08-10 (×2): 3 [IU] via SUBCUTANEOUS
  Administered 2021-08-10: 1 [IU] via SUBCUTANEOUS
  Administered 2021-08-11 (×2): 2 [IU] via SUBCUTANEOUS
  Administered 2021-08-11: 1 [IU] via SUBCUTANEOUS
  Administered 2021-08-12: 2 [IU] via SUBCUTANEOUS
  Administered 2021-08-12 – 2021-08-13 (×4): 1 [IU] via SUBCUTANEOUS
  Administered 2021-08-14: 2 [IU] via SUBCUTANEOUS
  Administered 2021-08-14: 1 [IU] via SUBCUTANEOUS
  Administered 2021-08-14 – 2021-08-15 (×3): 2 [IU] via SUBCUTANEOUS
  Administered 2021-08-16: 1 [IU] via SUBCUTANEOUS
  Administered 2021-08-17: 3 [IU] via SUBCUTANEOUS
  Administered 2021-08-17: 1 [IU] via SUBCUTANEOUS
  Administered 2021-08-17 – 2021-08-18 (×2): 2 [IU] via SUBCUTANEOUS
  Administered 2021-08-18 – 2021-08-19 (×3): 3 [IU] via SUBCUTANEOUS
  Administered 2021-08-19 (×2): 2 [IU] via SUBCUTANEOUS
  Administered 2021-08-20: 3 [IU] via SUBCUTANEOUS
  Administered 2021-08-20 (×2): 2 [IU] via SUBCUTANEOUS
  Administered 2021-08-21: 3 [IU] via SUBCUTANEOUS
  Administered 2021-08-21: 2 [IU] via SUBCUTANEOUS
  Administered 2021-08-21: 3 [IU] via SUBCUTANEOUS
  Administered 2021-08-22: 2 [IU] via SUBCUTANEOUS
  Administered 2021-08-22: 1 [IU] via SUBCUTANEOUS
  Administered 2021-08-22: 3 [IU] via SUBCUTANEOUS
  Administered 2021-08-23: 2 [IU] via SUBCUTANEOUS
  Administered 2021-08-23 – 2021-08-24 (×4): 3 [IU] via SUBCUTANEOUS
  Administered 2021-08-24: 1 [IU] via SUBCUTANEOUS
  Administered 2021-08-25: 2 [IU] via SUBCUTANEOUS
  Administered 2021-08-25: 5 [IU] via SUBCUTANEOUS
  Administered 2021-08-25 – 2021-08-26 (×2): 3 [IU] via SUBCUTANEOUS
  Administered 2021-08-26: 5 [IU] via SUBCUTANEOUS
  Administered 2021-08-26 – 2021-08-27 (×2): 3 [IU] via SUBCUTANEOUS
  Administered 2021-08-27: 9 [IU] via SUBCUTANEOUS
  Administered 2021-08-27 – 2021-08-28 (×2): 3 [IU] via SUBCUTANEOUS
  Filled 2021-08-01 (×70): qty 1

## 2021-08-01 MED ORDER — APIXABAN 5 MG PO TABS
5.0000 mg | ORAL_TABLET | Freq: Two times a day (BID) | ORAL | Status: DC
  Administered 2021-08-01 – 2021-08-04 (×6): 5 mg via ORAL
  Filled 2021-08-01 (×6): qty 1

## 2021-08-01 MED ORDER — FUROSEMIDE 10 MG/ML IJ SOLN
6.0000 mg/h | INTRAVENOUS | Status: DC
  Administered 2021-08-01 – 2021-08-03 (×3): 6 mg/h via INTRAVENOUS
  Filled 2021-08-01 (×3): qty 20

## 2021-08-01 MED ORDER — INSULIN ASPART 100 UNIT/ML IJ SOLN
0.0000 [IU] | Freq: Every day | INTRAMUSCULAR | Status: DC
  Administered 2021-08-01 – 2021-08-02 (×2): 3 [IU] via SUBCUTANEOUS
  Administered 2021-08-03: 4 [IU] via SUBCUTANEOUS
  Administered 2021-08-04 – 2021-08-24 (×6): 2 [IU] via SUBCUTANEOUS
  Administered 2021-08-25: 3 [IU] via SUBCUTANEOUS
  Administered 2021-08-26: 2 [IU] via SUBCUTANEOUS
  Filled 2021-08-01 (×14): qty 1

## 2021-08-01 NOTE — Procedures (Signed)
PROCEDURE SUMMARY:  Successful US guided paracentesis from LLQ.  Yielded 1.2 L of amber slightly cloudy fluid.  No immediate complications.  Pt tolerated well.   Specimen was sent for labs.  EBL N/A  Rockney Ghee 08/01/2021 3:27 PM

## 2021-08-01 NOTE — Progress Notes (Addendum)
PROGRESS NOTE  HALANA DEISHER OMV:[id number] DOB: 1946/05/17 DOA: 07/26/2021 PCP: Valerie Roys, DO  Brief History    KIERRE DEINES is a 75 y.o. female with medical history significant for HFpEF, permanent atrial fibrillation on Eliquis, PAD, type 2 diabetes, CKD stage IIIb, lymphedema who presents with concerns of increasing shortness of breath and edema.   Patient notes that since her last hospitalization in August she has been having increasing abdominal distention.  Had increasing lower extremity edema on top of her feet for the past 2 weeks.  She also notes worsening dyspnea on exertion for the past several weeks and had chest x-ray with PCP showing recurrence of left-sided pleural effusion.  She was in the process of being referred to pulmonology for thoracentesis but could not wait for the appointment due to worsening symptoms and decided to present to the ED.   Patient last admitted on 7/28-8/7 with Acute on chronic diastolic CHF, left-sided pleural effusion and acute on chronic CKD.  Both cardiology and nephrology was consulted and she was treated with IV diuretic. Had thoracentesis on 8/1 with 400 cc removed likely transudative fluid based on limited lab work  she had metolazone, torsemide and Aldactone held at discharge. She had follow-up with nephrology on 8/11 and had restarted torsemide 20 mg 3 times per week and metolazone 2.5 mg once a week.  Later switched Torsemide every other day since she had discussion with nephrology that it would help make it easier for  her. States she still has good urine output with changes in regimen.  However she has been monitoring her weight daily and has noted a 5lb  weight gain from 218 to 223lb since August. Last dose of Torsemide yesterday.    ED Course: She was afebrile, hypotensive down to 84/62 initially which improved with 500 cc normal saline fluid.  Sodium of 128, K of 2.6, chloride of 79, creatinine of 1.78 around her baseline, BG  195.  Magnesium of 2.2.  BNP of 154.  Normal AST and ALT.  T bili mildly elevated at 2.6. Chest x-ray showing large left-sided pleural effusion.  Pulmonology consulted for effusion. Patient has gone for thoracentesis with removal of 600 cc of fluid which will be sent for study.  The patient is complaining of increasing edema and swelling of lower extremities bilaterally.   Consultants  Pulmonology Heart Failure Nephrology  Procedures  None  Antibiotics   Anti-infectives (From admission, onward)    None       Subjective  The patient is sitting up at bedside. Complaining of increasing swelling and edema bilaterally.  Objective   Vitals:  Vitals:   08/01/21 0716 08/01/21 1114  BP: (!) 135/93 125/83  Pulse: 75 78  Resp: 18 18  Temp: 98.1 F (36.7 C) 98 F (36.7 C)  SpO2: 98% 97%    Exam:  Constitutional:  Awake, alert, and oriented x 3.  Respiratory:  CTA bilaterally, no w/r/r.  Respiratory effort normal. No retractions or accessory muscle use Diminished breath sounds and dullness to percussion on right base. Cardiovascular:  RRR, no m/r/g Worsening lower extremity edema particularly on the dorsum of feet with erythema and blistering/weeping of lower extremities bilaterally.  Normal pedal pulses Abdomen:  Abdomen appears normal; no tenderness or masses No hernias No HSM Musculoskeletal:  Digits/nails BUE: no clubbing, cyanosis, petechiae, infection exam of joints, bones, muscles of at least one of following: head/neck, RUE, LUE, RLE, LLE   Skin:  No rashes, lesions, ulcers  palpation of skin: no induration or nodules Neurologic:  CN 2-12 intact Sensation all 4 extremities intact Psychiatric:  Mental status Mood, affect appropriate Orientation to person, place, time  judgment and insight appear intact    I have personally reviewed the following:   Today's Data  Vitals  Lab Data  CBC, BMP  Imaging  CXR  Cardiology Data  EKG  Scheduled  Meds:  apixaban  5 mg Oral BID   hydrocortisone sod succinate (SOLU-CORTEF) inj  100 mg Intravenous Q8H   insulin aspart  0-5 Units Subcutaneous QHS   insulin aspart  0-9 Units Subcutaneous TID WC   latanoprost  1 drop Both Eyes QHS   midodrine  10 mg Oral TID WC   polyethylene glycol  17 g Oral Daily   Continuous Infusions:  albumin human 60 mL/hr at 08/01/21 1504   furosemide (LASIX) 200 mg in dextrose 5% 100 mL (2mg /mL) infusion 6 mg/hr (08/01/21 1504)    Principal Problem:   Acute on chronic diastolic CHF (congestive heart failure) (HCC) Active Problems:   Type 2 diabetes mellitus with renal complication ([place])   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 diabetes mellitus (HCC)   Atrial fibrillation (HCC)   Hypotension   Pleural effusion   LOS: 6 days  Acute on chronic diastolic CHF Anasarca: Worsening edema of lower extremities bilaterally. -Pt on torsemide 20 mg 3 times per week and metolazone 2.5 mg once a week here with large pleural effusion, anasarca and increasing LE edema -Very complex heart failure and CKD patient. Her creatinine has been sensitive to increasing diuresis in the past. Also very susceptible to hypovolemia and hypotension to increasing diuresis.  -needs cardiology and nephrology consult. There was discussion of referring for placement of CardioMEMS by cardiology in the past which needs to be re-addressed.  -recent echo on 04/2021 with EF of 60-65%  -s/p IV Lasix 40 mg IV bid, but diuresis is limited by hypotension. Patient may require midodrine to bring pressures up so that more effective diuresis may be obtained.  -Midodrine 10 mg tid, albumin, and solucortef added to increase blood pressure enough to allow for maximal diuresis.  - I appreciate the help of PCCM and Nephrology in this patient. 10/12 started Lasix IV infusion, patient will benefit from continuous slow diuresis secondary to soft blood pressure and AKI Cr 1.9 slightly elevated Check TSH level Abd distention,  follow ultrasound abdomen, patient may need paracentesis   Left pleural effusion secondary to diastolic CHF Consult pulm for thoracentesis if unable to diurese off. S/p left thora 600 cc removed, LIkely transudative.    Hypotension BP low normal. Monitor. This is limiting the ability to diurese this patient. Midodrine 10 mg tid, albumin, and solucortef added to increase blood pressure enough to allow for maximal diuresis. Metoprolol XL has been discontinued.   Hypokalemia Resolved. Monitor. Likely due to diuretics Daily oral supplementation   Hyponatremia Sodium of 127 likely from diuresis improved to 128. Fluid restriction discontinued.   CKD stage 3b -creatinine stable at 1.68 close to baseline around 1.4-1.7. I appreciate nephrology's help.   Permanent atrial fibrillation 10/12 resumed Eliquis 5 BID   Type 2 diabetes Diet controlled   I have seen and examined this patient myself. I have spent 32 minutes in her evaluation and care.   DVT prophylaxis:.on Eliquis Code Status: Full Family Communication: Plan discussed with patient and husband at bedside  Disposition Plan: Home with at least 2 midnight stays  Consults called:  Admission  status: inpatient   Level of care: Progressive Cardiac   Status is: Inpatient   Remains inpatient appropriate because:Inpatient level of care appropriate due to severity of illness   Dispo: The patient is from: Home              Anticipated d/c is to: Home              Patient currently is not medically stable to d/c.              Difficult to place patient No   Val Riles, MD [facility]

## 2021-08-01 NOTE — Progress Notes (Signed)
[facility]  ROUNDING NOTE   Subjective:   Karen Farley was admitted to [facility] on 07/26/2021 for Morbid obesity ([place]) [E66.01] DOE (dyspnea on exertion) [R06.09] Recurrent left pleural effusion [J90] Hypotension [I95.9] Type 2 diabetes mellitus without complication, without long-term current use of insulin ([place]) [E11.9] Atrial fibrillation, unspecified type ([place]) [I48.91] Chronic congestive heart failure, unspecified heart failure type [facility]) [I50.9]  Patient was last seen by my partner, Dr. Candiss Norse, on 05/31/21. Where torsemide was restarted and to continue metolazone.   Patient admitted with shortness of breath and increasing peripheral edema. She was then started on IV furosemide. Left thoracentesis done yesterday on 07/28/21  63mL of pleural fluid.   Patient seen sitting in recliner with legs reclined States swelling has not improved much Tolerating meals and abiding by fluid restriction Denies shortness of breath   Objective:  Vital signs in last 24 hours:  Temp:  [97.8 F (36.6 C)-98.6 F (37 C)] 98 F (36.7 C) (10/12 1114) Pulse Rate:  [75-86] 78 (10/12 1114) Resp:  [15-20] 18 (10/12 1114) BP: (107-135)/(59-93) 125/83 (10/12 1114) SpO2:  [91 %-98 %] 97 % (10/12 1114) Weight:  [105.2 kg] 105.2 kg (10/12 0339)  Weight change: 0.499 kg Filed Weights   07/30/21 0326 07/31/21 0414 08/01/21 0339  Weight: 104 kg 104.7 kg 105.2 kg    Intake/Output: I/O last 3 completed shifts: In: 88 [P.O.:1680] Out: 1701 [Urine:1700; Stool:1]   Intake/Output this shift:  Total I/O In: 240 [P.O.:240] Out: 625 [Urine:625]  Physical Exam: General: NAD, sitting up in chair  Head: Normocephalic, atraumatic. Moist oral mucosal membranes  Eyes: Anicteric  Lungs:  Clear bilaterally, normal effort  Heart: Regular rate and rhythm  Abdomen:  Soft, nontender  Extremities:  ++ peripheral edema.  Neurologic: Nonfocal, moving all four extremities  Skin: Bilateral lower  extremity erythema and fluid filled blisters, bandaged        Basic Metabolic Panel: Recent Labs  Lab 07/26/21 1708 07/27/21 0637 07/28/21 0753 07/28/21 1828 07/29/21 0425 07/31/21 0815 08/01/21 0434  NA 128*   < > 129* 127* 129* 128* 128*  K 2.6*   < > 2.8* 3.5 3.5 4.8 5.1  CL 79*   < > 82* 86* 87* 87* 91*  CO2 34*   < > 33* 32 32 28 22  GLUCOSE 195*   < > 138* 229* 152* 205* 227*  BUN 65*   < > 59* 55* 51* 48* 54*  CREATININE 1.78*   < > 1.68* 1.56* 1.52* 1.85* 1.94*  CALCIUM 9.4   < > 9.1 9.3 9.3 9.5 9.3  MG 2.2  --   --   --   --   --   --    < > = values in this interval not displayed.     Liver Function Tests: Recent Labs  Lab 07/26/21 1708  AST 27  ALT 16  ALKPHOS 68  BILITOT 2.3*  PROT 6.1*  ALBUMIN 3.2*    No results for input(s): LIPASE, AMYLASE in the last 168 hours. No results for input(s): AMMONIA in the last 168 hours.  CBC: Recent Labs  Lab 07/26/21 1708 07/28/21 0753 08/01/21 0434  WBC 8.1 6.4 11.3*  NEUTROABS  --  4.8 9.9*  HGB 13.6 13.2 13.6  HCT 42.5 42.2 41.9  MCV 77.1* 75.8* 75.9*  PLT 341 354 398     Cardiac Enzymes: No results for input(s): CKTOTAL, CKMB, CKMBINDEX, TROPONINI in the last 168 hours.  BNP: Invalid input(s):  POCBNP  CBG: Recent Labs  Lab 07/28/21 2153 07/29/21 2102  GLUCAP 202* 172*     Microbiology: Results for orders placed or performed during the hospital encounter of 07/26/21  Resp Panel by RT-PCR (Flu A&B, Covid) Nasopharyngeal Swab     Status: None   Collection Time: 07/26/21  7:19 PM   Specimen: Nasopharyngeal Swab; Nasopharyngeal(NP) swabs in vial transport medium  Result Value Ref Range Status   SARS Coronavirus 2 by RT PCR NEGATIVE NEGATIVE Final    Comment: (NOTE) SARS-CoV-2 target nucleic acids are NOT DETECTED.  The SARS-CoV-2 RNA is generally detectable in upper respiratory specimens during the acute phase of infection. The lowest concentration of SARS-CoV-2 viral copies this assay  can detect is 138 copies/mL. A negative result does not preclude SARS-Cov-2 infection and should not be used as the sole basis for treatment or other patient management decisions. A negative result may occur with  improper specimen collection/handling, submission of specimen other than nasopharyngeal swab, presence of viral mutation(s) within the areas targeted by this assay, and inadequate number of viral copies(<138 copies/mL). A negative result must be combined with clinical observations, patient history, and epidemiological information. The expected result is Negative.  Fact Sheet for Patients:  EntrepreneurPulse.com.au  Fact Sheet for Healthcare Providers:  IncredibleEmployment.be  This test is no t yet approved or cleared by the [place] FDA and  has been authorized for detection and/or diagnosis of SARS-CoV-2 by FDA under an Emergency Use Authorization (EUA). This EUA will remain  in effect (meaning this test can be used) for the duration of the COVID-19 declaration under Section 564(b)(1) of the Act, 21 U.S.C.section 360bbb-3(b)(1), unless the authorization is terminated  or revoked sooner.       Influenza A by PCR NEGATIVE NEGATIVE Final   Influenza B by PCR NEGATIVE NEGATIVE Final    Comment: (NOTE) The Xpert Xpress SARS-CoV-2/FLU/RSV plus assay is intended as an aid in the diagnosis of influenza from Nasopharyngeal swab specimens and should not be used as a sole basis for treatment. Nasal washings and aspirates are unacceptable for Xpert Xpress SARS-CoV-2/FLU/RSV testing.  Fact Sheet for Patients: EntrepreneurPulse.com.au  Fact Sheet for Healthcare Providers: IncredibleEmployment.be  This test is not yet approved or cleared by the [place] FDA and has been authorized for detection and/or diagnosis of SARS-CoV-2 by FDA under an Emergency Use Authorization (EUA). This EUA will  remain in effect (meaning this test can be used) for the duration of the COVID-19 declaration under Section 564(b)(1) of the Act, 21 U.S.C. section 360bbb-3(b)(1), unless the authorization is terminated or revoked.  Performed at [facility], [address] [place], [place] [address]   Body fluid culture w Gram Stain     Status: None   Collection Time: 07/28/21  1:53 PM   Specimen: PATH Cytology Pleural fluid  Result Value Ref Range Status   Specimen Description   Final    PLEURAL Performed at [facility], [address] [place], [place] [address]    Special Requests   Final    NONE Performed at [facility], [address] [place], [place] [address]    Gram Stain   Final    NO SQUAMOUS EPITHELIAL CELLS SEEN FEW WBC SEEN NO ORGANISMS SEEN    Culture   Final    NO GROWTH 3 DAYS Performed at [facility], [place] [address], [place], [place] [address]    Report Status 08/01/2021 FINAL  Final  Coagulation Studies: No results for input(s): LABPROT, INR in the last 72 hours.  Urinalysis: No results for input(s): COLORURINE, LABSPEC, PHURINE, GLUCOSEU, HGBUR, BILIRUBINUR, KETONESUR, PROTEINUR, UROBILINOGEN, NITRITE, LEUKOCYTESUR in the last 72 hours.  Invalid input(s): APPERANCEUR    Imaging: No results found.   Medications:    albumin human 12.5 g (08/01/21 0840)   furosemide (LASIX) 200 mg in dextrose 5% 100 mL (2mg /mL) infusion      apixaban  5 mg Oral BID   hydrocortisone sod succinate (SOLU-CORTEF) inj  100 mg Intravenous Q8H   insulin aspart  0-5 Units Subcutaneous QHS   insulin aspart  0-9 Units Subcutaneous TID WC   latanoprost  1 drop Both Eyes QHS   midodrine  10 mg Oral TID WC   polyethylene glycol  17 g Oral Daily     Assessment/ Plan:  Karen Farley is a 75 y.o. white female with diastolic congestive heart failure, hypertension, hyperlipidemia, lymphedema, atrial fibrillation, peripheral vascular  disease who is admitted to [facility] on 07/26/2021 for Morbid obesity ([place]) [E66.01] DOE (dyspnea on exertion) [R06.09] Recurrent left pleural effusion [J90] Hypotension [I95.9] Type 2 diabetes mellitus without complication, without long-term current use of insulin (HCC) [E11.9] Atrial fibrillation, unspecified type ([place]) [I48.91] Chronic congestive heart failure, unspecified heart failure type ([place]) [I50.9]  Acute kidney injury on chronic kidney disease stage IIIB: with baseline creatinine of 1.66, GFR of 36 on 07/13/21. History of bland urine. 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 Hold  benazepril and spironolactone.  - Attempted IV lasix without sustained success.Daily weights indicate steady increase in weight daily. Will transition to Lasix drip at 6mg /hr. Will continue Albumin twice daily. Will monitor daily -Will consider increasing Metolazone to twice weekly at discharge.  Hypotension: on metoprolol, furosemide and midodrine. BP stable 125/83. Maxed out on midodrine dose. Other options included decreasing dose of metoprolol.   Acute exacerbation of diastolic congestive heart failure:  - IV Furosemide drip started as above - fluid restriction  Hyponatremia: with hypervolumia.   - Expect improvement with Lasix drip and fluid removal - Continue fluid restriction.  - metolazone as above.   5. Left pleural effusion: Thoracentesis on 07/28/21 with 6100ml removed. Solucortef ordered by Pulmonology.    LOS: [place] 10/12/202212:51 PM

## 2021-08-01 NOTE — Progress Notes (Signed)
Pulmonary Medicine          Date: 08/01/2021,   MRN# [id number] DAJANEE Farley 01-16-1946     AdmissionWeight: 105.7 kg                 CurrentWeight: 105.2 kg   Referring physician: Dr. Benny Lennert   CHIEF COMPLAINT:   Acute hypoxemic respiratory failure secondary to bilateral pleural effusions   HISTORY OF PRESENT ILLNESS   This is a pleasant 75 year old female with a medical history of diastolic CHF, atrial fibrillation on Eliquis, increased shortness of breath and edema on arrival.  Patient was recently hospitalized and relates that after discharge had worsening abdominal girth and lower extremity edema specifically over the last 2 weeks with notable pitting.  On arrival she had chest imaging done with x-ray showing left-sided pleural effusion.  She was referred to pulmonary in outpatient but clinically deteriorated and wanted to be seen prior to her appointment so came to hospital due to this reason.  She does have CKD and has been seen by nephrology on the last admission.  Was placed on torsemide metolazone and Aldactone at that time.  She was able to see nephrology on outpatient and this was reduced to 20 mg 3 times weekly.  On admission she had hypotension which was fluid responsive with an AKI on CKD chest x-ray again showing large left-sided pleural effusion.  PCCM consultation due to acute hypoxemic respiratory failure with pleural effusions.  07/29/21- patient improved post thoracentesis, she did PT today  07/30/21- patient is 2200cc UOP overnight but lower extermities are more swollen.  Reviewed need to reduce fluid intake patient shares she has been drinking freely and is not on restriction.  We advanced diet to 1200cc fluid restriction.  Her breathing is less labored after thoracentesis. Pleural fluid profile is monocyte predominant serous simple transudate indicative of chronic benign fluid.   07/31/21- no overnight events. Continue current diuretic and supportive  care  08/01/21- patient is stable on room air.  Mild aki on ckd but mentation is good and physically less swollen.    PAST MEDICAL HISTORY   Past Medical History:  Diagnosis Date   Chronic heart failure with preserved ejection fraction (HFpEF) ([place])    a. 11/2019 Echo: EF 60-65%, no rwma, Nl RV size/fxn. Mod dil LA.   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)    DDD (degenerative disc disease), lumbar    Degenerative disc disease, lumbar    bulging and dengerated   Diabetes mellitus without complication (HCC)    Hyperlipidemia    Hypertension    Lymphedema    Morbid obesity (HCC)    Osteoarthritis of both knees    Osteopenia      SURGICAL HISTORY   Past Surgical History:  Procedure Laterality Date   CHOLECYSTECTOMY     DILATION AND CURETTAGE OF UTERUS     TEAR DUCT PROBING WITH STRABISMUS REPAIR Right    TONSILLECTOMY       FAMILY HISTORY   Family History  Problem Relation Age of Onset   Heart disease Mother        CHF   Osteoporosis Mother    Breast cancer Mother    Heart disease Father 84   Cancer Brother        Colon CA- 2002- Youngest brother   Parkinson's disease Brother        Younger Brother   Heart disease Brother  2 brothers   Cancer Brother    Breast cancer Maternal Grandmother      SOCIAL HISTORY   Social History   Tobacco Use   Smoking status: Never   Smokeless tobacco: Never  Vaping Use   Vaping Use: Never used  Substance Use Topics   Alcohol use: Not Currently    Alcohol/week: 0.0 standard drinks    Comment: on rare occasion   Drug use: No     MEDICATIONS    Home Medication:    Current Medication:  Current Facility-Administered Medications:    acetaminophen (TYLENOL) tablet 650 mg, 650 mg, Oral, Q4H PRN, Mansy, Jan A, MD, 650 mg at 07/30/21 3086   albumin human 25 % solution 12.5 g, 12.5 g, Intravenous, BID, Breeze, Shantelle, NP, Last Rate: 60 mL/hr at 08/01/21 0840, 12.5 g at 08/01/21 0840   furosemide  (LASIX) 200 mg in dextrose 5 % 100 mL (2 mg/mL) infusion, 6 mg/hr, Intravenous, Continuous, Breeze, Shantelle, NP   hydrocortisone sodium succinate (SOLU-CORTEF) 100 MG injection 100 mg, 100 mg, Intravenous, Q8H, Byford Schools, MD, 100 mg at 08/01/21 0215   latanoprost (XALATAN) 0.005 % ophthalmic solution 1 drop, 1 drop, Both Eyes, QHS, Tu, Ching T, DO, 1 drop at 07/31/21 2206   midodrine (PROAMATINE) tablet 10 mg, 10 mg, Oral, TID WC, Swayze, Ava, DO, 10 mg at 08/01/21 0830   polyethylene glycol (MIRALAX / GLYCOLAX) packet 17 g, 17 g, Oral, Daily, Breeze, Shantelle, NP, 17 g at 08/01/21 0830    ALLERGIES   Patient has no known allergies.     REVIEW OF SYSTEMS    Review of Systems:  Gen:  Denies  fever, sweats, chills weigh loss  HEENT: Denies blurred vision, double vision, ear pain, eye pain, hearing loss, nose bleeds, sore throat Cardiac:  No dizziness, chest pain or heaviness, chest tightness,edema Resp:   Denies cough or sputum porduction, shortness of breath,wheezing, hemoptysis,  Gi: Denies swallowing difficulty, stomach pain, nausea or vomiting, diarrhea, constipation, bowel incontinence Gu:  Denies bladder incontinence, burning urine Ext:   Denies Joint pain, stiffness or swelling Skin: Denies  skin rash, easy bruising or bleeding or hives Endoc:  Denies polyuria, polydipsia , polyphagia or weight change Psych:   Denies depression, insomnia or hallucinations   Other:  All other systems negative   VS: BP 125/83 (BP Location: Right Arm)   Pulse 78   Temp 98 F (36.7 C) (Oral)   Resp 18   Ht 5\' 2"  (1.575 m)   Wt 105.2 kg   SpO2 97%   BMI 42.42 kg/m      PHYSICAL EXAM    GENERAL:NAD, no fevers, chills, no weakness no fatigue HEAD: Normocephalic, atraumatic.  EYES: Pupils equal, round, reactive to light. Extraocular muscles intact. No scleral icterus.  MOUTH: Moist mucosal membrane. Dentition intact. No abscess noted.  EAR, NOSE, THROAT: Clear without  exudates. No external lesions.  NECK: Supple. No thyromegaly. No nodules. No JVD.  PULMONARY: Decreased breath sounds bilaterally at bases.  CARDIOVASCULAR: S1 and S2. Regular rate and rhythm. No murmurs, rubs, or gallops. No edema. Pedal pulses 2+ bilaterally.  GASTROINTESTINAL: Soft, nontender, nondistended. No masses. Positive bowel sounds. No hepatosplenomegaly.  MUSCULOSKELETAL: No swelling, clubbing, or edema. Range of motion full in all extremities.  NEUROLOGIC: Cranial nerves II through XII are intact. No gross focal neurological deficits. Sensation intact. Reflexes intact.  SKIN: No ulceration, lesions, rashes, or cyanosis. Skin warm and dry. Turgor intact.  PSYCHIATRIC: Mood, affect within normal  limits. The patient is awake, alert and oriented x 3. Insight, judgment intact.       IMAGING    DG Chest 2 View  Result Date: 07/20/2021 CLINICAL DATA:  Patient is here to follow up for pleural effusion. Patient complains of cough since April and has fluid retention. Patient denies asthma and is a non smoker, but has a history of a A-fib and has no other lung/heart issues or surgeries. EXAM: CHEST - 2 VIEW COMPARISON:  Radiography of the chest dated 05/21/2021. Radiography of the chest dated 05/20/2021. FINDINGS: Persistent enlargement of the cardiac silhouette. Atherosclerotic calcification of the aortic knob. Moderate-large left-sided pleural effusion with left basilar atelectasis. Right lung is clear. No pneumothorax. IMPRESSION: Persistent moderate-large left-sided pleural effusion with left basilar atelectasis. Electronically Signed   By: Davina Poke D.O.   On: 07/17/2021 11:03   DG Chest Port 1 View  Result Date: 07/28/2021 CLINICAL DATA:  Post left-sided thoracentesis. EXAM: PORTABLE CHEST 1 VIEW COMPARISON:  Earlier same day; 07/26/2021 FINDINGS: Grossly unchanged enlarged cardiac silhouette and mediastinal contours with atherosclerotic plaque within the aortic arch. Interval  reduction in persistent trace left-sided effusion post thoracentesis. Improved aeration of left lung base with persistent partial obscuration of the left heart border secondary to left basilar heterogeneous/consolidative opacities. The right hemithorax remains well aerated. Mild point is congestion without frank evidence of edema. No pneumothorax. No acute osseous abnormalities. IMPRESSION: 1. No evidence of complication following left-sided thoracentesis. Specifically, no pneumothorax. 2. Improved aeration of left lung base with persistent left basilar opacities, atelectasis versus infiltrate. 3. Otherwise, similar findings of cardiomegaly and pulmonary venous congestion without frank evidence of edema. 4.  Aortic Atherosclerosis (ICD10-I70.0). Electronically Signed   By: Sandi Mariscal M.D.   On: 07/28/2021 14:04   DG Chest Port 1 View  Result Date: 07/28/2021 CLINICAL DATA:  75 year old female with history of increasing shortness of breath with low oxygen saturations. EXAM: PORTABLE CHEST 1 VIEW COMPARISON:  Chest x-ray 07/26/2021. FINDINGS: Persistent opacification throughout the left mid to lower hemithorax. Right lung is clear. No definite right pleural effusion. No pneumothorax. Cardiac silhouette is obscured. The patient is rotated to the left on today's exam, resulting in distortion of the mediastinal contours and reduced diagnostic sensitivity and specificity for mediastinal pathology. IMPRESSION: 1. Persistent opacification in the left mid to lower hemithorax compatible with areas of atelectasis and/or consolidation, likely with superimposed large left pleural effusion. 2. Aortic atherosclerosis. Electronically Signed   By: Vinnie Langton M.D.   On: 07/28/2021 10:32   DG Chest Portable 1 View  Result Date: 07/26/2021 CLINICAL DATA:  Shortness of breath EXAM: PORTABLE CHEST 1 VIEW COMPARISON:  07/16/2021 FINDINGS: Large left-sided pleural effusion slightly increased. Probable trace right effusion.  Cardiomegaly with vascular congestion. Persistent consolidation in the left lower lung. IMPRESSION: Large left-sided pleural effusion with airspace disease in the left mid to lower lung. Trace right pleural effusion. Cardiomegaly with vascular congestion Electronically Signed   By: Donavan Foil M.D.   On: 07/26/2021 18:10   US THORACENTESIS ASP PLEURAL SPACE W/IMG GUIDE  Result Date: 07/28/2021 INDICATION: Symptomatic left-sided pleural effusion. Please from ultrasound-guided thoracentesis for diagnostic and therapeutic purposes. EXAM: US THORACENTESIS ASP PLEURAL SPACE W/IMG GUIDE COMPARISON:  Chest radiograph-earlier same day; 05/20/2021; ultrasound-guided left-sided thoracentesis yielding 400 cc pleural fluid. MEDICATIONS: None. COMPLICATIONS: None immediate. TECHNIQUE: Informed written consent was obtained from the patient after a discussion of the risks, benefits and alternatives to treatment. A timeout was performed prior to the  initiation of the procedure. Initial ultrasound scanning demonstrates a small anechoic left-sided pleural effusion. The lower chest was prepped and draped in the usual sterile fashion. 1% lidocaine was used for local anesthesia. Under direct ultrasound guidance, an 8 Fr Safe-T-Centesis catheter was introduced. The thoracentesis was performed. The catheter was removed and a dressing was applied. The patient tolerated the procedure well without immediate post procedural complication. The patient was escorted to have an upright chest radiograph. FINDINGS: A total of approximately 600 cc of serous fluid was removed. Requested samples were sent to the laboratory. IMPRESSION: Successful ultrasound-guided left sided thoracentesis yielding 600 cc of pleural fluid. Electronically Signed   By: Sandi Mariscal M.D.   On: 07/28/2021 14:06      ASSESSMENT/PLAN   Large left pleural effusion -likely due to CHF/CKD - patient slow to diurese - will order thoracentesis with fluid studies.   Previously patient had thoracentesis and shares she felt better immediately.  -serial CXR  -will reviewe fluid studies post thoracentesis, she does not appear acutely toxic from pneumonia I dought this will be empyema but it could be exudate due to ongoing diuresis -Monocyte predominant transudate - benign fluid likely due to CHF/CKD   CKD   Nephrology to guide appropriate diuresis -reviewed with nephrology - adding solucortef and albumin due to persistent transient hypotension despite 10 TID of midodrine   Thank you for allowing me to participate in the care of this patient.   Patient/Family are satisfied with care plan and all questions have been answered.  This document was prepared using Dragon voice recognition software and may include unintentional dictation errors.     Ottie Glazier, M.D.  Division of [place]

## 2021-08-02 DIAGNOSIS — I5033 Acute on chronic diastolic (congestive) heart failure: Secondary | ICD-10-CM | POA: Diagnosis not present

## 2021-08-02 LAB — CBC
HCT: 40.2 % (ref 36.0–46.0)
Hemoglobin: 12.9 g/dL (ref 12.0–15.0)
MCH: 24.2 pg — ABNORMAL LOW (ref 26.0–34.0)
MCHC: 32.1 g/dL (ref 30.0–36.0)
MCV: 75.3 fL — ABNORMAL LOW (ref 80.0–100.0)
Platelets: 337 10*3/uL (ref 150–400)
RBC: 5.34 MIL/uL — ABNORMAL HIGH (ref 3.87–5.11)
RDW: 18.5 % — ABNORMAL HIGH (ref 11.5–15.5)
WBC: 9.6 10*3/uL (ref 4.0–10.5)
nRBC: 0 % (ref 0.0–0.2)

## 2021-08-02 LAB — BASIC METABOLIC PANEL
Anion gap: 11 (ref 5–15)
BUN: 57 mg/dL — ABNORMAL HIGH (ref 8–23)
CO2: 27 mmol/L (ref 22–32)
Calcium: 9.2 mg/dL (ref 8.9–10.3)
Chloride: 93 mmol/L — ABNORMAL LOW (ref 98–111)
Creatinine, Ser: 1.84 mg/dL — ABNORMAL HIGH (ref 0.44–1.00)
GFR, Estimated: 28 mL/min — ABNORMAL LOW (ref >60)
Glucose, Bld: 253 mg/dL — ABNORMAL HIGH (ref 70–99)
Potassium: 4.8 mmol/L (ref 3.5–5.1)
Sodium: 131 mmol/L — ABNORMAL LOW (ref 135–145)

## 2021-08-02 LAB — GLUCOSE, CAPILLARY
Glucose-Capillary: 234 mg/dL — ABNORMAL HIGH (ref 70–99)
Glucose-Capillary: 312 mg/dL — ABNORMAL HIGH (ref 70–99)
Glucose-Capillary: 279 mg/dL — ABNORMAL HIGH (ref 70–99)
Glucose-Capillary: 280 mg/dL — ABNORMAL HIGH (ref 70–99)

## 2021-08-02 LAB — PHOSPHORUS: Phosphorus: 3.5 mg/dL (ref 2.5–4.6)

## 2021-08-02 LAB — MAGNESIUM: Magnesium: 2.4 mg/dL (ref 1.7–2.4)

## 2021-08-02 MED ORDER — VITAMIN B-12 1000 MCG PO TABS
500.0000 ug | ORAL_TABLET | Freq: Every day | ORAL | Status: DC
  Administered 2021-08-04 – 2021-08-28 (×24): 500 ug via ORAL
  Filled 2021-08-02 (×23): qty 1
  Filled 2021-08-02: qty 5
  Filled 2021-08-02: qty 1

## 2021-08-02 MED ORDER — CYANOCOBALAMIN 1000 MCG/ML IJ SOLN
1000.0000 ug | Freq: Every day | INTRAMUSCULAR | Status: AC
  Administered 2021-08-02 – 2021-08-03 (×2): 1000 ug via INTRAMUSCULAR
  Filled 2021-08-02 (×2): qty 1

## 2021-08-02 MED ORDER — HYDROCORTISONE SOD SUC (PF) 100 MG IJ SOLR
100.0000 mg | Freq: Two times a day (BID) | INTRAMUSCULAR | Status: AC
  Administered 2021-08-02 – 2021-08-03 (×2): 100 mg via INTRAVENOUS
  Filled 2021-08-02 (×2): qty 2

## 2021-08-02 MED ORDER — SODIUM CHLORIDE 0.9 % IV SOLN
200.0000 mg | Freq: Every day | INTRAVENOUS | Status: AC
  Administered 2021-08-02 – 2021-08-06 (×5): 200 mg via INTRAVENOUS
  Filled 2021-08-02: qty 200
  Filled 2021-08-02: qty 10
  Filled 2021-08-02 (×4): qty 200

## 2021-08-02 MED ORDER — FOLIC ACID 1 MG PO TABS
1.0000 mg | ORAL_TABLET | Freq: Every day | ORAL | Status: DC
  Administered 2021-08-02 – 2021-08-28 (×26): 1 mg via ORAL
  Filled 2021-08-02 (×26): qty 1

## 2021-08-02 NOTE — Progress Notes (Signed)
[facility]  ROUNDING NOTE   Subjective:   [name] was admitted to [facility] on 07/26/2021 for Morbid obesity ([place]) [E66.01] DOE (dyspnea on exertion) [R06.09] Recurrent left pleural effusion [J90] Hypotension [I95.9] Type 2 diabetes mellitus without complication, without long-term current use of insulin ([place]) [E11.9] Atrial fibrillation, unspecified type ([place]) [I48.91] Chronic congestive heart failure, unspecified heart failure type [facility]) [I50.9]  Patient was last seen by my partner, Dr. Candiss Norse, on 05/31/21. Where torsemide was restarted and to continue metolazone.   Patient admitted with shortness of breath and increasing peripheral edema. She was then started on IV furosemide. Left thoracentesis done yesterday on 07/28/21  620mL of pleural fluid.   Patient seen ambulating in the room States she feels edema has improved Reports multiple bathroom visits during the night Denies shortness of breath Patient seen later sitting up in chair  Objective:  Vital signs in last 24 hours:  Temp:  [97.5 F (36.4 C)-98.1 F (36.7 C)] 98 F (36.7 C) (10/13 1130) Pulse Rate:  [84-96] 86 (10/13 1130) Resp:  [16-18] 16 (10/13 1130) BP: (106-127)/(69-107) 122/83 (10/13 1130) SpO2:  [94 %-96 %] 96 % (10/13 1130) Weight:  [104.6 kg] 104.6 kg (10/13 0502)  Weight change: -0.589 kg Filed Weights   07/31/21 0414 08/01/21 0339 08/02/21 0502  Weight: 104.7 kg 105.2 kg 104.6 kg    Intake/Output: I/O last 3 completed shifts: In: 1414.1 [P.O.:1260; I.V.:4.1; IV Piggyback:150] Out: 2801 [Urine:2800; Stool:1]   Intake/Output this shift:  Total I/O In: 360 [P.O.:360] Out: -   Physical Exam: General: NAD  Head: Normocephalic, atraumatic. Moist oral mucosal membranes  Eyes: Anicteric  Lungs:  Clear bilaterally, normal effort  Heart: Regular rate and rhythm  Abdomen:  Soft, nontender  Extremities:  ++ peripheral edema.  Neurologic: Nonfocal, moving all four extremities   Skin: Bilateral lower extremity erythema and fluid filled blisters, bandaged        Basic Metabolic Panel: Recent Labs  Lab 07/26/21 1708 07/27/21 0637 07/28/21 1828 07/29/21 0425 07/31/21 0815 08/01/21 0434 08/02/21 0432  NA 128*   < > 127* 129* 128* 128* 131*  K 2.6*   < > 3.5 3.5 4.8 5.1 4.8  CL 79*   < > 86* 87* 87* 91* 93*  CO2 34*   < > 32 32 28 22 27   GLUCOSE 195*   < > 229* 152* 205* 227* 253*  BUN 65*   < > 55* 51* 48* 54* 57*  CREATININE 1.78*   < > 1.56* 1.52* 1.85* 1.94* 1.84*  CALCIUM 9.4   < > 9.3 9.3 9.5 9.3 9.2  MG 2.2  --   --   --   --   --  2.4  PHOS  --   --   --   --   --   --  3.5   < > = values in this interval not displayed.     Liver Function Tests: Recent Labs  Lab 07/26/21 1708  AST 27  ALT 16  ALKPHOS 68  BILITOT 2.3*  PROT 6.1*  ALBUMIN 3.2*    No results for input(s): LIPASE, AMYLASE in the last 168 hours. No results for input(s): AMMONIA in the last 168 hours.  CBC: Recent Labs  Lab 07/26/21 1708 07/28/21 0753 08/01/21 0434 08/02/21 0432  WBC 8.1 6.4 11.3* 9.6  NEUTROABS  --  4.8 9.9*  --   HGB 13.6 13.2 13.6 12.9  HCT 42.5 42.2 41.9 40.2  MCV 77.1*  75.8* 75.9* 75.3*  PLT 341 354 398 337     Cardiac Enzymes: No results for input(s): CKTOTAL, CKMB, CKMBINDEX, TROPONINI in the last 168 hours.  BNP: Invalid input(s): POCBNP  CBG: Recent Labs  Lab 07/29/21 2102 08/01/21 1624 08/01/21 2041 08/02/21 0734 08/02/21 1133  GLUCAP 172* 328* 290* 234* 312*     Microbiology: Results for orders placed or performed during the hospital encounter of 07/26/21  Resp Panel by RT-PCR (Flu A&B, Covid) Nasopharyngeal Swab     Status: None   Collection Time: 07/26/21  7:19 PM   Specimen: Nasopharyngeal Swab; Nasopharyngeal(NP) swabs in vial transport medium  Result Value Ref Range Status   SARS Coronavirus 2 by RT PCR NEGATIVE NEGATIVE Final    Comment: (NOTE) SARS-CoV-2 target nucleic acids are NOT DETECTED.  The  SARS-CoV-2 RNA is generally detectable in upper respiratory specimens during the acute phase of infection. The lowest concentration of SARS-CoV-2 viral copies this assay can detect is 138 copies/mL. A negative result does not preclude SARS-Cov-2 infection and should not be used as the sole basis for treatment or other patient management decisions. A negative result may occur with  improper specimen collection/handling, submission of specimen other than nasopharyngeal swab, presence of viral mutation(s) within the areas targeted by this assay, and inadequate number of viral copies(<138 copies/mL). A negative result must be combined with clinical observations, patient history, and epidemiological information. The expected result is Negative.  Fact Sheet for Patients:  EntrepreneurPulse.com.au  Fact Sheet for Healthcare Providers:  IncredibleEmployment.be  This test is no t yet approved or cleared by the [place] FDA and  has been authorized for detection and/or diagnosis of SARS-CoV-2 by FDA under an Emergency Use Authorization (EUA). This EUA will remain  in effect (meaning this test can be used) for the duration of the COVID-19 declaration under Section 564(b)(1) of the Act, 21 U.S.C.section 360bbb-3(b)(1), unless the authorization is terminated  or revoked sooner.       Influenza A by PCR NEGATIVE NEGATIVE Final   Influenza B by PCR NEGATIVE NEGATIVE Final    Comment: (NOTE) The Xpert Xpress SARS-CoV-2/FLU/RSV plus assay is intended as an aid in the diagnosis of influenza from Nasopharyngeal swab specimens and should not be used as a sole basis for treatment. Nasal washings and aspirates are unacceptable for Xpert Xpress SARS-CoV-2/FLU/RSV testing.  Fact Sheet for Patients: EntrepreneurPulse.com.au  Fact Sheet for Healthcare Providers: IncredibleEmployment.be  This test is not yet approved or  cleared by the [place] FDA and has been authorized for detection and/or diagnosis of SARS-CoV-2 by FDA under an Emergency Use Authorization (EUA). This EUA will remain in effect (meaning this test can be used) for the duration of the COVID-19 declaration under Section 564(b)(1) of the Act, 21 U.S.C. section 360bbb-3(b)(1), unless the authorization is terminated or revoked.  Performed at [facility], [address] [place], [place] [address]   Body fluid culture w Gram Stain     Status: None   Collection Time: 07/28/21  1:53 PM   Specimen: PATH Cytology Pleural fluid  Result Value Ref Range Status   Specimen Description   Final    PLEURAL Performed at [facility], [address] [place], [place] [address]    Special Requests   Final    NONE Performed at [facility], [place],  [address]    Gram Stain   Final    NO SQUAMOUS EPITHELIAL CELLS SEEN FEW WBC SEEN NO  ORGANISMS SEEN    Culture   Final    NO GROWTH 3 DAYS Performed at [facility], [place] [address] [place], [place] [address]    Report Status 08/01/2021 FINAL  Final  Body fluid culture w Gram Stain     Status: None (Preliminary result)   Collection Time: 08/01/21  3:36 PM   Specimen: PATH Cytology Peritoneal fluid  Result Value Ref Range Status   Specimen Description   Final    PERITONEAL Performed at [facility], [address], [place], [place] [address]    Special Requests   Final    NONE Performed at [facility], [address] [place], [place] [address]    Gram Stain   Final    RARE WBC PRESENT,BOTH PMN AND MONONUCLEAR NO ORGANISMS SEEN    Culture   Final    NO GROWTH < 24 HOURS Performed at [facility], [place] [address], [place], [place] [address]    Report Status PENDING  Incomplete    Coagulation Studies: No results for input(s): LABPROT, INR in the last 72 hours.  Urinalysis: No results for  input(s): COLORURINE, LABSPEC, PHURINE, GLUCOSEU, HGBUR, BILIRUBINUR, KETONESUR, PROTEINUR, UROBILINOGEN, NITRITE, LEUKOCYTESUR in the last 72 hours.  Invalid input(s): APPERANCEUR    Imaging: US Paracentesis  Result Date: 08/01/2021 INDICATION: Ascites request received for diagnostic and therapeutic paracentesis. EXAM: ULTRASOUND GUIDED THERAPEUTIC PARACENTESIS MEDICATIONS: Local 1% lidocaine only. COMPLICATIONS: None immediate. PROCEDURE: Informed written consent was obtained from the patient after a discussion of the risks, benefits and alternatives to treatment. A timeout was performed prior to the initiation of the procedure. Initial ultrasound scanning demonstrates a small amount of ascites within the left lower abdominal quadrant. The left lower abdomen was prepped and draped in the usual sterile fashion. 1% lidocaine was used for local anesthesia. Following this, a 19 gauge, 10-cm, Yueh catheter was introduced. An ultrasound image was saved for documentation purposes. The paracentesis was performed. The catheter was removed and a dressing was applied. The patient tolerated the procedure well without immediate post procedural complication. FINDINGS: A total of approximately 1.2 L of amber colored slightly cloudy fluid was removed. Samples were sent to the laboratory as requested by the clinical team. IMPRESSION: Successful ultrasound-guided paracentesis yielding 1.2 liters of peritoneal fluid. Read By: Tsosie Billing PA-C Electronically Signed   By: Jerilynn Mages.  Shick M.D.   On: 08/01/2021 15:59   US Abdomen Limited RUQ (LIVER/GB)  Result Date: 08/01/2021 CLINICAL DATA:  75 year old female with history of cirrhosis. EXAM: ULTRASOUND ABDOMEN LIMITED RIGHT UPPER QUADRANT COMPARISON:  No prior. FINDINGS: Gallbladder: Status post cholecystectomy. Common bile duct: Diameter: 4 mm Liver: No focal lesion identified. Within normal limits in parenchymal echogenicity. Portal vein is patent on color Doppler imaging  with normal direction of blood flow towards the liver. Other: None. IMPRESSION: 1. No acute findings. 2. Status post cholecystectomy. Electronically Signed   By: Vinnie Langton M.D.   On: 08/01/2021 18:07     Medications:    albumin human 12.5 g (08/02/21 0837)   furosemide (LASIX) 200 mg in dextrose 5% 100 mL (2mg /mL) infusion 6 mg/hr (08/02/21 1202)   iron sucrose 200 mg (08/02/21 1027)    apixaban  5 mg Oral BID   cyanocobalamin  1,000 mcg Intramuscular Daily   folic acid  1 mg Oral Daily   hydrocortisone sod succinate (SOLU-CORTEF) inj  100 mg Intravenous Q12H   insulin aspart  0-5 Units Subcutaneous QHS  insulin aspart  0-9 Units Subcutaneous TID WC   latanoprost  1 drop Both Eyes QHS   midodrine  10 mg Oral TID WC   polyethylene glycol  17 g Oral Daily   [START ON 08/04/2021] vitamin B-12  500 mcg Oral Daily     Assessment/ Plan:  Karen Farley is a 75 y.o. white female with diastolic congestive heart failure, hypertension, hyperlipidemia, lymphedema, atrial fibrillation, peripheral vascular disease who is admitted to [facility] on 07/26/2021 for Morbid obesity ([place]) [E66.01] DOE (dyspnea on exertion) [R06.09] Recurrent left pleural effusion [J90] Hypotension [I95.9] Type 2 diabetes mellitus without complication, without long-term current use of insulin ([facility]) [E11.9] Atrial fibrillation, unspecified type ([place]) [I48.91] Chronic congestive heart failure, unspecified heart failure type ([place]) [I50.9]  Acute kidney injury on chronic kidney disease stage IIIB: with baseline creatinine of 1.66, GFR of 36 on 07/13/21. History of bland urine. 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 Hold benazepril and spironolactone.  Attempted IV lasix without sustained success.Daily weights indicate steady increase in weight daily. Transitioned to Lasix drip at 6mg /hr.with Albumin twice daily.  - Recorded UOP 2.2L in last 24 hours.  -Will consider increasing Metolazone to  twice weekly at discharge.  Hypotension: on furosemide drip and midodrine. BP stable 122/83. Maxed out on midodrine dose. Metoprolol discontinued  Acute exacerbation of diastolic congestive heart failure:  - IV Furosemide drip as above - fluid restriction  Hyponatremia: with hypervolumia.   - Expect improvement with Lasix drip and fluid removal - Continue fluid restriction.  - metolazone as above.   5. Left pleural effusion: Thoracentesis on 07/28/21 with 671ml removed. Paracentesis on 08/01/21 with 1.2L removed. Solucortef ordered. Pulmonology following   LOS: [place] 10/13/20222:44 PM

## 2021-08-02 NOTE — Progress Notes (Signed)
Inpatient Diabetes Program Recommendations  AACE/ADA: New Consensus Statement on Inpatient Glycemic Control (2015)  Target Ranges:  Prepandial:   less than 140 mg/dL      Peak postprandial:   less than 180 mg/dL (1-2 hours)      Critically ill patients:  140 - 180 mg/dL  Results for MODELLE, VOLLMER (MRN [id number]) as of 08/02/2021 07:30  Ref. Range 08/01/2021 16:24 08/01/2021 20:41  Glucose-Capillary Latest Ref Range: 70 - 99 mg/dL 328 (H)  7 units Novolog  290 (H)  3 units Novolog @2307   Results for FLOIS, MCTAGUE (MRN [id number]) as of 08/02/2021 09:02  Ref. Range 08/02/2021 07:34  Glucose-Capillary Latest Ref Range: 70 - 99 mg/dL 234 (H)  3 units Novolog      Admit: Acute on chronic diastolic CHF/ Anasarca  History: DM, CKD, CHF  Home DM Meds: Tradjenta 5 mg daily  Current Orders: Novolog 0-9 units TID ac/hs      MD- Note patient Getting Solucortef 100 mg Q8H  AM CBGs remain elevated.  Please consider starting Levemir 10 units Daily (0.1 units/kg)    --Will follow patient during hospitalization--  Wyn Quaker RN, MSN, CDE Diabetes Coordinator Inpatient Glycemic Control Team Team Pager: [phone] (8a-5p)

## 2021-08-02 NOTE — Progress Notes (Addendum)
   Heart Failure Nurse Navigator Note  Met with patient today, she was currently lying in bed in no acute distress.  She states that she underwent paracentesis yesterday which we removed 1200 mL she still feels that her abdomen is tight.  Also discussed her leg wraps and she does not feel that it is being done twice a day as ordered by the wound care nurse.  Discussed with her nurse Ebony Hail, she states that it is on her list of things to do today.  And that she was aware that night shift had not change the wraps.  Patient states that her husband had some questions for me and he was not here at this time.  I told her I would gladly come back and also gave her one of my business cards so if he wanted we could talk over the phone.  She is currently getting IV iron.  Was on IV Lasix and albumin yesterday.  Will continue to follow.  Returned later in the afternoon as husband had some questions.  Talked in depth about the outpatient heart failure clinic.  Splane to them that it was a one-on-one appointment not a group meeting.  Discussed the importance of getting established and that she would always have her as a resource for problems or questions.  They voiced understanding.  She would like to wait until after he has been discharged and is home to call and make her own appointment for the clinic rather than to be given 1 on discharge.  Discussed that I would make [facility] aware.  He had no further questions.   Pricilla Riffle RN CHFN

## 2021-08-02 NOTE — Progress Notes (Signed)
OT Cancellation Note  Patient Details Name: Karen Farley MRN: [id number] DOB: 27-May-1946   Cancelled Treatment:    Reason Eval/Treat Not Completed: Other (comment). Upon attempt, pt sharing with therapist why she wasn't eager to work with therapy, MD entered to assess. Will re-attempt OT tx at later time/date as pt is agreeable and available.  Ardeth Perfect., MPH, MS, OTR/L ascom [phone] 08/02/21, 11:18 AM

## 2021-08-02 NOTE — Progress Notes (Signed)
Pulmonary Medicine          Date: 08/02/2021,   MRN# [id number] OMARI KOSLOSKY 07/12/1946     AdmissionWeight: 105.7 kg                 CurrentWeight: 104.6 kg   Referring physician: Dr. Benny Lennert   CHIEF COMPLAINT:   Acute hypoxemic respiratory failure secondary to bilateral pleural effusions   HISTORY OF PRESENT ILLNESS   This is a pleasant 75 year old female with a medical history of diastolic CHF, atrial fibrillation on Eliquis, increased shortness of breath and edema on arrival.  Patient was recently hospitalized and relates that after discharge had worsening abdominal girth and lower extremity edema specifically over the last 2 weeks with notable pitting.  On arrival she had chest imaging done with x-ray showing left-sided pleural effusion.  She was referred to pulmonary in outpatient but clinically deteriorated and wanted to be seen prior to her appointment so came to hospital due to this reason.  She does have CKD and has been seen by nephrology on the last admission.  Was placed on torsemide metolazone and Aldactone at that time.  She was able to see nephrology on outpatient and this was reduced to 20 mg 3 times weekly.  On admission she had hypotension which was fluid responsive with an AKI on CKD chest x-ray again showing large left-sided pleural effusion.  PCCM consultation due to acute hypoxemic respiratory failure with pleural effusions.  07/29/21- patient improved post thoracentesis, she did PT today  07/30/21- patient is 2200cc UOP overnight but lower extermities are more swollen.  Reviewed need to reduce fluid intake patient shares she has been drinking freely and is not on restriction.  We advanced diet to 1200cc fluid restriction.  Her breathing is less labored after thoracentesis. Pleural fluid profile is monocyte predominant serous simple transudate indicative of chronic benign fluid.   07/31/21- no overnight events. Continue current diuretic and supportive  care  08/01/21- patient is stable on room air.  Mild aki on ckd but mentation is good and physically less swollen.    08/02/21- Patient has LE dressing on and seems like less swelling peripherally. She remains on room air. She is stable clinically and from pulmonary perspective is improving. We will follow peripherally and can see her in clinic post discharge.   PAST MEDICAL HISTORY   Past Medical History:  Diagnosis Date   Chronic heart failure with preserved ejection fraction (HFpEF) ([place])    a. 11/2019 Echo: EF 60-65%, no rwma, Nl RV size/fxn. Mod dil LA.   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)    DDD (degenerative disc disease), lumbar    Degenerative disc disease, lumbar    bulging and dengerated   Diabetes mellitus without complication (HCC)    Hyperlipidemia    Hypertension    Lymphedema    Morbid obesity (HCC)    Osteoarthritis of both knees    Osteopenia      SURGICAL HISTORY   Past Surgical History:  Procedure Laterality Date   CHOLECYSTECTOMY     DILATION AND CURETTAGE OF UTERUS     TEAR DUCT PROBING WITH STRABISMUS REPAIR Right    TONSILLECTOMY       FAMILY HISTORY   Family History  Problem Relation Age of Onset   Heart disease Mother        CHF   Osteoporosis Mother    Breast cancer Mother    Heart disease Father  44   Cancer Brother        Colon CA- 2002- Youngest brother   Parkinson's disease Brother        Younger Brother   Heart disease Brother        2 brothers   Cancer Brother    Breast cancer Maternal Grandmother      SOCIAL HISTORY   Social History   Tobacco Use   Smoking status: Never   Smokeless tobacco: Never  Vaping Use   Vaping Use: Never used  Substance Use Topics   Alcohol use: Not Currently    Alcohol/week: 0.0 standard drinks    Comment: on rare occasion   Drug use: No     MEDICATIONS    Home Medication:    Current Medication:  Current Facility-Administered Medications:    acetaminophen (TYLENOL)  tablet 650 mg, 650 mg, Oral, Q4H PRN, Mansy, Jan A, MD, 650 mg at 07/30/21 1610   albumin human 25 % solution 12.5 g, 12.5 g, Intravenous, BID, Breeze, Shantelle, NP, Last Rate: 60 mL/hr at 08/01/21 1721, 12.5 g at 08/01/21 1721   apixaban (ELIQUIS) tablet 5 mg, 5 mg, Oral, BID, Val Riles, MD, 5 mg at 08/01/21 2306   furosemide (LASIX) 200 mg in dextrose 5 % 100 mL (2 mg/mL) infusion, 6 mg/hr, Intravenous, Continuous, Breeze, Shantelle, NP, Last Rate: 3 mL/hr at 08/01/21 1504, 6 mg/hr at 08/01/21 1504   hydrocortisone sodium succinate (SOLU-CORTEF) 100 MG injection 100 mg, 100 mg, Intravenous, Q8H, Tianna Baus, MD, 100 mg at 08/02/21 0135   insulin aspart (novoLOG) injection 0-5 Units, 0-5 Units, Subcutaneous, QHS, Val Riles, MD, 3 Units at 08/01/21 2307   insulin aspart (novoLOG) injection 0-9 Units, 0-9 Units, Subcutaneous, TID WC, Val Riles, MD, 7 Units at 08/01/21 1723   latanoprost (XALATAN) 0.005 % ophthalmic solution 1 drop, 1 drop, Both Eyes, QHS, Tu, Ching T, DO, 1 drop at 08/01/21 2308   midodrine (PROAMATINE) tablet 10 mg, 10 mg, Oral, TID WC, Swayze, Ava, DO, 10 mg at 08/01/21 1723   polyethylene glycol (MIRALAX / GLYCOLAX) packet 17 g, 17 g, Oral, Daily, Breeze, Shantelle, NP, 17 g at 08/01/21 0830    ALLERGIES   Patient has no known allergies.     REVIEW OF SYSTEMS    Review of Systems:  Gen:  Denies  fever, sweats, chills weigh loss  HEENT: Denies blurred vision, double vision, ear pain, eye pain, hearing loss, nose bleeds, sore throat Cardiac:  No dizziness, chest pain or heaviness, chest tightness,edema Resp:   Denies cough or sputum porduction, shortness of breath,wheezing, hemoptysis,  Gi: Denies swallowing difficulty, stomach pain, nausea or vomiting, diarrhea, constipation, bowel incontinence Gu:  Denies bladder incontinence, burning urine Ext:   Denies Joint pain, stiffness or swelling Skin: Denies  skin rash, easy bruising or bleeding or  hives Endoc:  Denies polyuria, polydipsia , polyphagia or weight change Psych:   Denies depression, insomnia or hallucinations   Other:  All other systems negative   VS: BP (!) 114/97 (BP Location: Right Arm)   Pulse 96   Temp 98.1 F (36.7 C) (Oral)   Resp 16   Ht 5\' 2"  (1.575 m)   Wt 104.6 kg   SpO2 95%   BMI 42.18 kg/m      PHYSICAL EXAM    GENERAL:NAD, no fevers, chills, no weakness no fatigue HEAD: Normocephalic, atraumatic.  EYES: Pupils equal, round, reactive to light. Extraocular muscles intact. No scleral icterus.  MOUTH: Moist mucosal membrane.  Dentition intact. No abscess noted.  EAR, NOSE, THROAT: Clear without exudates. No external lesions.  NECK: Supple. No thyromegaly. No nodules. No JVD.  PULMONARY: Decreased breath sounds bilaterally at bases.  CARDIOVASCULAR: S1 and S2. Regular rate and rhythm. No murmurs, rubs, or gallops. No edema. Pedal pulses 2+ bilaterally.  GASTROINTESTINAL: Soft, nontender, nondistended. No masses. Positive bowel sounds. No hepatosplenomegaly.  MUSCULOSKELETAL: No swelling, clubbing, or edema. Range of motion full in all extremities.  NEUROLOGIC: Cranial nerves II through XII are intact. No gross focal neurological deficits. Sensation intact. Reflexes intact.  SKIN: No ulceration, lesions, rashes, or cyanosis. Skin warm and dry. Turgor intact.  PSYCHIATRIC: Mood, affect within normal limits. The patient is awake, alert and oriented x 3. Insight, judgment intact.       IMAGING    DG Chest 2 View  Result Date: 07/20/2021 CLINICAL DATA:  Patient is here to follow up for pleural effusion. Patient complains of cough since April and has fluid retention. Patient denies asthma and is a non smoker, but has a history of a A-fib and has no other lung/heart issues or surgeries. EXAM: CHEST - 2 VIEW COMPARISON:  Radiography of the chest dated 05/21/2021. Radiography of the chest dated 05/20/2021. FINDINGS: Persistent enlargement of the cardiac  silhouette. Atherosclerotic calcification of the aortic knob. Moderate-large left-sided pleural effusion with left basilar atelectasis. Right lung is clear. No pneumothorax. IMPRESSION: Persistent moderate-large left-sided pleural effusion with left basilar atelectasis. Electronically Signed   By: Davina Poke D.O.   On: 07/17/2021 11:03   US Paracentesis  Result Date: 08/01/2021 INDICATION: Ascites request received for diagnostic and therapeutic paracentesis. EXAM: ULTRASOUND GUIDED THERAPEUTIC PARACENTESIS MEDICATIONS: Local 1% lidocaine only. COMPLICATIONS: None immediate. PROCEDURE: Informed written consent was obtained from the patient after a discussion of the risks, benefits and alternatives to treatment. A timeout was performed prior to the initiation of the procedure. Initial ultrasound scanning demonstrates a small amount of ascites within the left lower abdominal quadrant. The left lower abdomen was prepped and draped in the usual sterile fashion. 1% lidocaine was used for local anesthesia. Following this, a 19 gauge, 10-cm, Yueh catheter was introduced. An ultrasound image was saved for documentation purposes. The paracentesis was performed. The catheter was removed and a dressing was applied. The patient tolerated the procedure well without immediate post procedural complication. FINDINGS: A total of approximately 1.2 L of amber colored slightly cloudy fluid was removed. Samples were sent to the laboratory as requested by the clinical team. IMPRESSION: Successful ultrasound-guided paracentesis yielding 1.2 liters of peritoneal fluid. Read By: Tsosie Billing PA-C Electronically Signed   By: Jerilynn Mages.  Shick M.D.   On: 08/01/2021 15:59   DG Chest Port 1 View  Result Date: 07/28/2021 CLINICAL DATA:  Post left-sided thoracentesis. EXAM: PORTABLE CHEST 1 VIEW COMPARISON:  Earlier same day; 07/26/2021 FINDINGS: Grossly unchanged enlarged cardiac silhouette and mediastinal contours with atherosclerotic  plaque within the aortic arch. Interval reduction in persistent trace left-sided effusion post thoracentesis. Improved aeration of left lung base with persistent partial obscuration of the left heart border secondary to left basilar heterogeneous/consolidative opacities. The right hemithorax remains well aerated. Mild point is congestion without frank evidence of edema. No pneumothorax. No acute osseous abnormalities. IMPRESSION: 1. No evidence of complication following left-sided thoracentesis. Specifically, no pneumothorax. 2. Improved aeration of left lung base with persistent left basilar opacities, atelectasis versus infiltrate. 3. Otherwise, similar findings of cardiomegaly and pulmonary venous congestion without frank evidence of edema. 4.  Aortic Atherosclerosis (ICD10-I70.0).  Electronically Signed   By: Sandi Mariscal M.D.   On: 07/28/2021 14:04   DG Chest Port 1 View  Result Date: 07/28/2021 CLINICAL DATA:  75 year old female with history of increasing shortness of breath with low oxygen saturations. EXAM: PORTABLE CHEST 1 VIEW COMPARISON:  Chest x-ray 07/26/2021. FINDINGS: Persistent opacification throughout the left mid to lower hemithorax. Right lung is clear. No definite right pleural effusion. No pneumothorax. Cardiac silhouette is obscured. The patient is rotated to the left on today's exam, resulting in distortion of the mediastinal contours and reduced diagnostic sensitivity and specificity for mediastinal pathology. IMPRESSION: 1. Persistent opacification in the left mid to lower hemithorax compatible with areas of atelectasis and/or consolidation, likely with superimposed large left pleural effusion. 2. Aortic atherosclerosis. Electronically Signed   By: Vinnie Langton M.D.   On: 07/28/2021 10:32   DG Chest Portable 1 View  Result Date: 07/26/2021 CLINICAL DATA:  Shortness of breath EXAM: PORTABLE CHEST 1 VIEW COMPARISON:  07/16/2021 FINDINGS: Large left-sided pleural effusion slightly  increased. Probable trace right effusion. Cardiomegaly with vascular congestion. Persistent consolidation in the left lower lung. IMPRESSION: Large left-sided pleural effusion with airspace disease in the left mid to lower lung. Trace right pleural effusion. Cardiomegaly with vascular congestion Electronically Signed   By: Donavan Foil M.D.   On: 07/26/2021 18:10   US Abdomen Limited RUQ (LIVER/GB)  Result Date: 08/01/2021 CLINICAL DATA:  75 year old female with history of cirrhosis. EXAM: ULTRASOUND ABDOMEN LIMITED RIGHT UPPER QUADRANT COMPARISON:  No prior. FINDINGS: Gallbladder: Status post cholecystectomy. Common bile duct: Diameter: 4 mm Liver: No focal lesion identified. Within normal limits in parenchymal echogenicity. Portal vein is patent on color Doppler imaging with normal direction of blood flow towards the liver. Other: None. IMPRESSION: 1. No acute findings. 2. Status post cholecystectomy. Electronically Signed   By: Vinnie Langton M.D.   On: 08/01/2021 18:07   US THORACENTESIS ASP PLEURAL SPACE W/IMG GUIDE  Result Date: 07/28/2021 INDICATION: Symptomatic left-sided pleural effusion. Please from ultrasound-guided thoracentesis for diagnostic and therapeutic purposes. EXAM: US THORACENTESIS ASP PLEURAL SPACE W/IMG GUIDE COMPARISON:  Chest radiograph-earlier same day; 05/20/2021; ultrasound-guided left-sided thoracentesis yielding 400 cc pleural fluid. MEDICATIONS: None. COMPLICATIONS: None immediate. TECHNIQUE: Informed written consent was obtained from the patient after a discussion of the risks, benefits and alternatives to treatment. A timeout was performed prior to the initiation of the procedure. Initial ultrasound scanning demonstrates a small anechoic left-sided pleural effusion. The lower chest was prepped and draped in the usual sterile fashion. 1% lidocaine was used for local anesthesia. Under direct ultrasound guidance, an 8 Fr Safe-T-Centesis catheter was introduced. The  thoracentesis was performed. The catheter was removed and a dressing was applied. The patient tolerated the procedure well without immediate post procedural complication. The patient was escorted to have an upright chest radiograph. FINDINGS: A total of approximately 600 cc of serous fluid was removed. Requested samples were sent to the laboratory. IMPRESSION: Successful ultrasound-guided left sided thoracentesis yielding 600 cc of pleural fluid. Electronically Signed   By: Sandi Mariscal M.D.   On: 07/28/2021 14:06      ASSESSMENT/PLAN   Large left pleural effusion -likely due to CHF/CKD - patient slow to diurese - will order thoracentesis with fluid studies.  Previously patient had thoracentesis and shares she felt better immediately.  -serial CXR  -will reviewe fluid studies post thoracentesis, she does not appear acutely toxic from pneumonia I dought this will be empyema but it could be exudate due to  ongoing diuresis -Monocyte predominant transudate - benign fluid likely due to CHF/CKD   CKD   Nephrology to guide appropriate diuresis -reviewed with nephrology - adding solucortef and albumin due to persistent transient hypotension despite 10 TID of midodrine   Thank you for allowing me to participate in the care of this patient.   Patient/Family are satisfied with care plan and all questions have been answered.  This document was prepared using Dragon voice recognition software and may include unintentional dictation errors.     Ottie Glazier, M.D.  Division of [facility]

## 2021-08-02 NOTE — Progress Notes (Signed)
PROGRESS NOTE  Karen Farley RSW:[id number] DOB: 11/14/1945 DOA: 07/26/2021 PCP: Valerie Roys, DO  Brief History    Karen Farley is a 75 y.o. female with medical history significant for HFpEF, permanent atrial fibrillation on Eliquis, PAD, type 2 diabetes, CKD stage IIIb, lymphedema who presents with concerns of increasing shortness of breath and edema.   Patient notes that since her last hospitalization in August she has been having increasing abdominal distention.  Had increasing lower extremity edema on top of her feet for the past 2 weeks.  She also notes worsening dyspnea on exertion for the past several weeks and had chest x-ray with PCP showing recurrence of left-sided pleural effusion.  She was in the process of being referred to pulmonology for thoracentesis but could not wait for the appointment due to worsening symptoms and decided to present to the ED.   Patient last admitted on 7/28-8/7 with Acute on chronic diastolic CHF, left-sided pleural effusion and acute on chronic CKD.  Both cardiology and nephrology was consulted and she was treated with IV diuretic. Had thoracentesis on 8/1 with 400 cc removed likely transudative fluid based on limited lab work  she had metolazone, torsemide and Aldactone held at discharge. She had follow-up with nephrology on 8/11 and had restarted torsemide 20 mg 3 times per week and metolazone 2.5 mg once a week.  Later switched Torsemide every other day since she had discussion with nephrology that it would help make it easier for  her. States she still has good urine output with changes in regimen.  However she has been monitoring her weight daily and has noted a 5lb  weight gain from 218 to 223lb since August. Last dose of Torsemide yesterday.    ED Course: She was afebrile, hypotensive down to 84/62 initially which improved with 500 cc normal saline fluid.  Sodium of 128, K of 2.6, chloride of 79, creatinine of 1.78 around her baseline, BG  195.  Magnesium of 2.2.  BNP of 154.  Normal AST and ALT.  T bili mildly elevated at 2.6. Chest x-ray showing large left-sided pleural effusion.  Pulmonology consulted for effusion. Patient has gone for thoracentesis with removal of 600 cc of fluid which will be sent for study.  The patient is complaining of increasing edema and swelling of lower extremities bilaterally.   Consultants  Pulmonology Heart Failure Nephrology  Procedures  None  Antibiotics   Anti-infectives (From admission, onward)    None       Subjective  No significant overnight events, patient still has significant bilateral lower extremity swelling, patient is making enough urine.  He denies any worsening of shortness of breath, no palpitations or chest pain.   Objective   Vitals:  Vitals:   08/02/21 0734 08/02/21 1130  BP: (!) 114/97 122/83  Pulse: 96 86  Resp: 16 16  Temp: 98.1 F (36.7 C) 98 F (36.7 C)  SpO2: 95% 96%    Exam:  Constitutional:  Awake, alert, and oriented x 3.  Respiratory:  CTA bilaterally, no w/r/r.  Respiratory effort normal. No retractions or accessory muscle use Diminished breath sounds and dullness to percussion on right base. Cardiovascular:  RRR, no m/r/g Worsening lower extremity edema particularly on the dorsum of feet with erythema and blistering/weeping of lower extremities bilaterally.  Normal pedal pulses Abdomen:  Abdomen appears normal; no tenderness or masses No hernias No HSM Musculoskeletal:  Digits/nails BUE: no clubbing, cyanosis, petechiae, infection exam of joints, bones, muscles of  at least one of following: head/neck, RUE, LUE, RLE, LLE   Skin:  No rashes, lesions, ulcers palpation of skin: no induration or nodules Neurologic:  CN 2-12 intact Sensation all 4 extremities intact Psychiatric:  Mental status Mood, affect appropriate Orientation to person, place, time  judgment and insight appear intact   Lower extremities: 4+ edema  extending up to thighs and lower abdomen  I have personally reviewed the following:   Today's Data  Vitals  Lab Data  CBC, BMP  Imaging  CXR  Cardiology Data  EKG  Scheduled Meds:  apixaban  5 mg Oral BID   cyanocobalamin  1,000 mcg Intramuscular Daily   folic acid  1 mg Oral Daily   hydrocortisone sod succinate (SOLU-CORTEF) inj  100 mg Intravenous Q12H   insulin aspart  0-5 Units Subcutaneous QHS   insulin aspart  0-9 Units Subcutaneous TID WC   latanoprost  1 drop Both Eyes QHS   midodrine  10 mg Oral TID WC   polyethylene glycol  17 g Oral Daily   [START ON 08/04/2021] vitamin B-12  500 mcg Oral Daily   Continuous Infusions:  albumin human 12.5 g (08/02/21 0837)   furosemide (LASIX) 200 mg in dextrose 5% 100 mL (2mg /mL) infusion 6 mg/hr (08/02/21 1202)   iron sucrose 200 mg (08/02/21 1027)    Principal Problem:   Acute on chronic diastolic CHF (congestive heart failure) (HCC) Active Problems:   Type 2 diabetes mellitus with renal complication ([place])   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 diabetes mellitus (HCC)   Atrial fibrillation (HCC)   Hypotension   Pleural effusion   LOS: 7 days  Acute on chronic diastolic CHF Anasarca: Worsening edema of lower extremities bilaterally. -Pt on torsemide 20 mg 3 times per week and metolazone 2.5 mg once a week here with large pleural effusion, anasarca and increasing LE edema -Very complex heart failure and CKD patient. Her creatinine has been sensitive to increasing diuresis in the past. Also very susceptible to hypovolemia and hypotension to increasing diuresis.  -needs cardiology and nephrology consult. There was discussion of referring for placement of CardioMEMS by cardiology in the past which needs to be re-addressed.  -recent echo on 04/2021 with EF of 60-65%  -s/p IV Lasix 40 mg IV bid, but diuresis is limited by hypotension. Patient may require midodrine to bring pressures up so that more effective diuresis may be obtained.   -Midodrine 10 mg tid, albumin, and solucortef added to increase blood pressure enough to allow for maximal diuresis.  - appreciate the help of PCCM and Nephrology in this patient. 10/12 started Lasix IV infusion, patient will benefit from continuous slow diuresis secondary to soft blood pressure and AKI Cr 1.9 --1.84 slightly improved  Check TSH level Abd distention, follow ultrasound abdomen, patient may need paracentesis   Left pleural effusion secondary to diastolic CHF Consult pulm for thoracentesis if unable to diurese off. S/p left thora 600 cc removed, LIkely transudative.    Hypotension BP low normal. Monitor. This is limiting the ability to diurese this patient. Midodrine 10 mg tid albumin, and solucortef added to increase blood pressure enough to allow for maximal diuresis. Metoprolol XL has been discontinued. 10/13 decreased Solu-Cortef 100 mg every 12 hourly x2 doses today  Hypokalemia Resolved. Monitor. Likely due to diuretics Daily oral supplementation   Hyponatremia Sodium of 127 likely from diuresis improved to 128. Fluid restriction discontinued. Na 131 improving gradually   CKD stage 3b -creatinine stable at  1.68 close to baseline around 1.4-1.7. I appreciate nephrology's help.   Permanent atrial fibrillation 10/12 resumed Eliquis 5 BID   Type 2 diabetes Diet controlled   Iron deficiency, iron saturation 4%, started Venofer 200 mg IV daily for 5 days, followed by oral supplement at discharge. Vitamin B12 level 284, target >400, started vitamin B12 1000 mcg IM injection x2 doses followed by 500 mcg p.o. daily Folic acid level 8.3, at lower end, started oral supplement to prevent deficiency  I have seen and examined this patient myself. I have spent 32 minutes in her evaluation and care.   DVT prophylaxis:.on Eliquis Code Status: Full Family Communication: Plan discussed with patient and husband at bedside  Disposition Plan: Home, still on IV Lasix  infusion, may require 3 to 5 days for IV diuresis and then plan to discharge home.  Consults called: Nephrology, PCCM, cardiology, Admission status: inpatient   Level of care: Progressive Cardiac   Status is: Inpatient   Remains inpatient appropriate because:Inpatient level of care appropriate due to severity of illness   Dispo: The patient is from: Home              Anticipated d/c is to: Home              Patient currently is not medically stable to d/c.              Difficult to place patient No   Val Riles, MD [facility]

## 2021-08-02 NOTE — Progress Notes (Signed)
PT Cancellation Note  Patient Details Name: Karen Farley MRN: [id number] DOB: 1946/04/25   Cancelled Treatment:    Reason Eval/Treat Not Completed: Other (comment) Pt refused PT this afternoon.  States she hasn't slept in 2 days and is very tired, had a lot going on today and just is not feeling like doing any extra activity.  She assures me she has been up using the bathroom, etc occasionally.  Also indicates that if she gets some rest she would be willing to try tomorrow...  Kreg Shropshire, DPT 08/02/2021, 5:02 PM

## 2021-08-02 NOTE — TOC Initial Note (Signed)
Transition of Care [facility]) - Initial/Assessment Note    Patient Details  Name: Karen Farley MRN: [id number] Date of Birth: 1946/02/19  Transition of Care [facility]) CM/SW Contact:    Alberteen Sam, LCSW Phone Number: 08/02/2021, 12:17 PM  Clinical Narrative:                  CSW met with patient at bedside to discuss home health recommendations made by PT. Patient recently had home health services then was discharged from services, reports she does not feel she needs home health as she learned what to do from her past [facility] PT and can continue to work on herself at home. Reports her husband is also a big support and she has a walker at home with no DME needs identified.   Expected Discharge Plan: Home/Self Care Barriers to Discharge: Continued Medical Work up   Patient Goals and CMS Choice Patient states their goals for this hospitalization and ongoing recovery are:: to go home CMS Medicare.gov Compare Post Acute Care list provided to:: Patient Choice offered to / list presented to : Patient  Expected Discharge Plan and Services Expected Discharge Plan: Home/Self Care       Living arrangements for the past 2 months: Single Family Home                                      Prior Living Arrangements/Services Living arrangements for the past 2 months: Single Family Home Lives with:: Spouse Patient language and need for interpreter reviewed:: Yes Do you feel safe going back to the place where you live?: Yes      Need for Family Participation in Patient Care: Yes (Comment) Care giver support system in place?: Yes (comment)   Criminal Activity/Legal Involvement Pertinent to Current Situation/Hospitalization: No - Comment as needed  Activities of Daily Living Home Assistive Devices/Equipment: Gilford Rile (specify type) (last couple days using walker, normally uses cane) ADL Screening (condition at time of admission) Patient's cognitive ability adequate to safely complete daily  activities?: Yes Is the patient deaf or have difficulty hearing?: No Does the patient have difficulty seeing, even when wearing glasses/contacts?: No Does the patient have difficulty concentrating, remembering, or making decisions?: No Patient able to express need for assistance with ADLs?: Yes Does the patient have difficulty dressing or bathing?: Yes (pt states exhaustion performing ADLs but able to do) Independently performs ADLs?: Yes (appropriate for developmental age) Does the patient have difficulty walking or climbing stairs?: Yes Weakness of Legs: Both Weakness of Arms/Hands: None  Permission Sought/Granted                  Emotional Assessment Appearance:: Appears stated age Attitude/Demeanor/Rapport: Gracious Affect (typically observed): Calm Orientation: : Oriented to Self, Oriented to Place, Oriented to  Time, Oriented to Situation Alcohol / Substance Use: Not Applicable Psych Involvement: No (comment)  Admission diagnosis:  Morbid obesity ([place]) [E66.01] DOE (dyspnea on exertion) [R06.09] Recurrent left pleural effusion [J90] Hypotension [I95.9] Type 2 diabetes mellitus without complication, without long-term current use of insulin (HCC) [E11.9] Atrial fibrillation, unspecified type ([place]) [I48.91] Chronic congestive heart failure, unspecified heart failure type ([place]) [I50.9] Patient Active Problem List   Diagnosis Date Noted   Anasarca    Stage 3b chronic kidney disease ([place]) 05/17/2021   Pleural effusion 05/17/2021   Acute on chronic diastolic CHF (congestive heart failure) ([place]) 05/17/2021   Obesity, Class III,  BMI 40-49.9 (morbid obesity) ([place]) 05/17/2021   Chronic anticoagulation 05/17/2021   Hypotension 04/11/2021   Acute kidney injury superimposed on CKD ([place]) 04/11/2021   Hyponatremia 04/11/2021   (HFpEF) heart failure with preserved ejection fraction ([place]) 04/11/2021   Chronic venous insufficiency 08/14/2020   PAD (peripheral artery disease) (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[place]) 01/24/2018   Piriformis syndrome 07/01/2016   Sacroiliac joint dysfunction 01/09/2016   Joint disorder, unspecified 01/09/2016   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 diabetes mellitus ([place]) 04/28/2015   Lymphedema 03/27/2015   Edema 03/27/2015   Open-angle glaucoma, mild stage    Osteoarthritis of both knees    Type 2 diabetes mellitus with renal complication (HCC)    Osteopenia    Benign hypertensive renal disease    Hyperlipidemia    Degeneration of lumbosacral intervertebral disc    PCP:  Valerie Roys, DO Pharmacy:   [facility] [place], [address] [place] [address] Phone: [phone] Fax: [phone]     Social Determinants of Health (SDOH) Interventions    Readmission Risk Interventions No flowsheet data found.

## 2021-08-03 ENCOUNTER — Ambulatory Visit: Payer: Medicare PPO | Admitting: Family Medicine

## 2021-08-03 DIAGNOSIS — I5033 Acute on chronic diastolic (congestive) heart failure: Secondary | ICD-10-CM | POA: Diagnosis not present

## 2021-08-03 LAB — PHOSPHORUS: Phosphorus: 3.3 mg/dL (ref 2.5–4.6)

## 2021-08-03 LAB — BASIC METABOLIC PANEL
Anion gap: 11 (ref 5–15)
BUN: 60 mg/dL — ABNORMAL HIGH (ref 8–23)
CO2: 29 mmol/L (ref 22–32)
Calcium: 9.2 mg/dL (ref 8.9–10.3)
Chloride: 95 mmol/L — ABNORMAL LOW (ref 98–111)
Creatinine, Ser: 1.75 mg/dL — ABNORMAL HIGH (ref 0.44–1.00)
GFR, Estimated: 30 mL/min — ABNORMAL LOW (ref >60)
Glucose, Bld: 280 mg/dL — ABNORMAL HIGH (ref 70–99)
Potassium: 4.1 mmol/L (ref 3.5–5.1)
Sodium: 135 mmol/L (ref 135–145)

## 2021-08-03 LAB — CBC
HCT: 41 % (ref 36.0–46.0)
Hemoglobin: 13.3 g/dL (ref 12.0–15.0)
MCH: 24.4 pg — ABNORMAL LOW (ref 26.0–34.0)
MCHC: 32.4 g/dL (ref 30.0–36.0)
MCV: 75.1 fL — ABNORMAL LOW (ref 80.0–100.0)
Platelets: 332 10*3/uL (ref 150–400)
RBC: 5.46 MIL/uL — ABNORMAL HIGH (ref 3.87–5.11)
RDW: 18.6 % — ABNORMAL HIGH (ref 11.5–15.5)
WBC: 8.1 10*3/uL (ref 4.0–10.5)
nRBC: 0 % (ref 0.0–0.2)

## 2021-08-03 LAB — MAGNESIUM: Magnesium: 2.7 mg/dL — ABNORMAL HIGH (ref 1.7–2.4)

## 2021-08-03 LAB — GLUCOSE, CAPILLARY
Glucose-Capillary: 253 mg/dL — ABNORMAL HIGH (ref 70–99)
Glucose-Capillary: 305 mg/dL — ABNORMAL HIGH (ref 70–99)
Glucose-Capillary: 274 mg/dL — ABNORMAL HIGH (ref 70–99)
Glucose-Capillary: 308 mg/dL — ABNORMAL HIGH (ref 70–99)

## 2021-08-03 LAB — PROTEIN, BODY FLUID (OTHER): Total Protein, Body Fluid Other: 2.9 g/dL

## 2021-08-03 MED ORDER — ENSURE MAX PROTEIN PO LIQD
11.0000 [oz_av] | Freq: Two times a day (BID) | ORAL | Status: DC
  Administered 2021-08-03 – 2021-08-27 (×33): 11 [oz_av] via ORAL
  Filled 2021-08-03: qty 330

## 2021-08-03 MED ORDER — MIDODRINE HCL 5 MG PO TABS
5.0000 mg | ORAL_TABLET | Freq: Three times a day (TID) | ORAL | Status: DC
  Administered 2021-08-03 – 2021-08-06 (×8): 5 mg via ORAL
  Filled 2021-08-03 (×9): qty 1

## 2021-08-03 MED ORDER — BLISTEX MEDICATED EX OINT
TOPICAL_OINTMENT | CUTANEOUS | Status: DC | PRN
  Filled 2021-08-03: qty 7
  Filled 2021-08-03: qty 6.3

## 2021-08-03 NOTE — Progress Notes (Signed)
PROGRESS NOTE  OLINA MELFI DZH:[id number] DOB: [birth year] DOA: 07/26/2021 PCP: Valerie Roys, DO  Brief History    Karen Farley is a 75 y.o. female with medical history significant for HFpEF, permanent atrial fibrillation on Eliquis, PAD, type 2 diabetes, CKD stage IIIb, lymphedema who presents with concerns of increasing shortness of breath and edema.   Patient notes that since her last hospitalization in August she has been having increasing abdominal distention.  Had increasing lower extremity edema on top of her feet for the past 2 weeks.  She also notes worsening dyspnea on exertion for the past several weeks and had chest x-ray with PCP showing recurrence of left-sided pleural effusion.  She was in the process of being referred to pulmonology for thoracentesis but could not wait for the appointment due to worsening symptoms and decided to present to the ED.   Patient last admitted on 7/28-8/7 with Acute on chronic diastolic CHF, left-sided pleural effusion and acute on chronic CKD.  Both cardiology and nephrology was consulted and she was treated with IV diuretic. Had thoracentesis on 8/1 with 400 cc removed likely transudative fluid based on limited lab work  she had metolazone, torsemide and Aldactone held at discharge. She had follow-up with nephrology on 8/11 and had restarted torsemide 20 mg 3 times per week and metolazone 2.5 mg once a week.  Later switched Torsemide every other day since she had discussion with nephrology that it would help make it easier for  her. States she still has good urine output with changes in regimen.  However she has been monitoring her weight daily and has noted a 5lb  weight gain from 218 to 223lb since August. Last dose of Torsemide yesterday.    ED Course: She was afebrile, hypotensive down to 84/62 initially which improved with 500 cc normal saline fluid.  Sodium of 128, K of 2.6, chloride of 79, creatinine of 1.78 around her baseline, BG  195.  Magnesium of 2.2.  BNP of 154.  Normal AST and ALT.  T bili mildly elevated at 2.6. Chest x-ray showing large left-sided pleural effusion.  Pulmonology consulted for effusion. Patient has gone for thoracentesis with removal of 600 cc of fluid which will be sent for study.  The patient is complaining of increasing edema and swelling of lower extremities bilaterally.   Consultants  Pulmonology Heart Failure Nephrology  Procedures  None  Antibiotics   Anti-infectives (From admission, onward)    None       Subjective  No significant overnight events, patient feels a little bit improvement in the edema, still has significant swelling in the bilateral lower extremity.  Patient did walk with physical therapy, able to ambulate.  Patient denied any shortness of breath, no chest pain or palpitations.    Objective   Vitals:  Vitals:   08/03/21 1114 08/03/21 1517  BP: 120/82 120/88  Pulse: 84 85  Resp: 18 17  Temp: 98.3 F (36.8 C) 98.4 F (36.9 C)  SpO2: 96% 96%    Exam:  Constitutional:  Awake, alert, and oriented x 3.  Respiratory:  CTA bilaterally, no w/r/r.  Respiratory effort normal. No retractions or accessory muscle use Diminished breath sounds and dullness to percussion on right base. Cardiovascular:  RRR, no m/r/g Worsening lower extremity edema particularly on the dorsum of feet with erythema and blistering/weeping of lower extremities bilaterally.  Normal pedal pulses Abdomen:  Abdomen appears normal; no tenderness or masses No hernias No HSM Musculoskeletal:  Digits/nails BUE: no clubbing, cyanosis, petechiae, infection exam of joints, bones, muscles of at least one of following: head/neck, RUE, LUE, RLE, LLE   Skin:  No rashes, lesions, ulcers palpation of skin: no induration or nodules Neurologic:  CN 2-12 intact Sensation all 4 extremities intact Psychiatric:  Mental status Mood, affect appropriate Orientation to person, place, time   judgment and insight appear intact   Lower extremities: 4+ edema extending up to thighs and lower abdomen  I have personally reviewed the following:   Today's Data  Vitals  Lab Data  CBC, BMP  Imaging  CXR  Cardiology Data  EKG  Scheduled Meds:  apixaban  5 mg Oral BID   folic acid  1 mg Oral Daily   insulin aspart  0-5 Units Subcutaneous QHS   insulin aspart  0-9 Units Subcutaneous TID WC   latanoprost  1 drop Both Eyes QHS   midodrine  5 mg Oral TID WC   polyethylene glycol  17 g Oral Daily   Ensure Max Protein  11 oz Oral BID   [START ON 08/04/2021] vitamin B-12  500 mcg Oral Daily   Continuous Infusions:  albumin human 12.5 g (08/03/21 0844)   furosemide (LASIX) 200 mg in dextrose 5% 100 mL (2mg /mL) infusion 6 mg/hr (08/02/21 1500)   iron sucrose 200 mg (08/03/21 0844)    Principal Problem:   Acute on chronic diastolic CHF (congestive heart failure) (HCC) Active Problems:   Type 2 diabetes mellitus with renal complication ([place])   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 diabetes mellitus (HCC)   Atrial fibrillation (HCC)   Hypotension   Pleural effusion   LOS: 8 days  Acute on chronic diastolic CHF Anasarca: Worsening edema of lower extremities bilaterally. -Pt on torsemide 20 mg 3 times per week and metolazone 2.5 mg once a week here with large pleural effusion, anasarca and increasing LE edema -Very complex heart failure and CKD patient. Her creatinine has been sensitive to increasing diuresis in the past. Also very susceptible to hypovolemia and hypotension to increasing diuresis.  -needs cardiology and nephrology consult. There was discussion of referring for placement of CardioMEMS by cardiology in the past which needs to be re-addressed.  -recent echo on 04/2021 with EF of 60-65%  -s/p IV Lasix 40 mg IV bid, but diuresis is limited by hypotension. Patient may require midodrine to bring pressures up so that more effective diuresis may be obtained.  -Midodrine 10 mg tid,  albumin, and solucortef added to increase blood pressure enough to allow for maximal diuresis.  - appreciate the help of PCCM and Nephrology in this patient. 10/12 started Lasix IV infusion, patient will benefit from continuous slow diuresis secondary to soft blood pressure and AKI Cr 1.9 --1.84--1.75 slightly improved  TSH level 0.9 wnl   Left pleural effusion secondary to diastolic CHF Consult pulm for thoracentesis if unable to diurese off. S/p left thora 600 cc removed, LIkely transudative.    Hypotension BP low normal. Monitor. This is limiting the ability to diurese this patient. Midodrine 10 mg tid albumin, and solucortef added to increase blood pressure enough to allow for maximal diuresis. Metoprolol XL has been discontinued. 10/13 decreased Solu-Cortef 100 mg every 12 hourly x2 doses and d/c'd today 10/14 10/14 decreased dose of midodrine from 10 to 5 mg p.o. 3 times daily with holding parameters, blood pressure seems to be improving  Hypokalemia Resolved. Monitor. Likely due to diuretics Daily oral supplementation   Hyponatremia,  Sodium of  127 likely from diuresis improved to 128. Fluid restriction discontinued. Na 131 --135 resolved   CKD stage 3b -creatinine stable at 1.68 close to baseline around 1.4-1.7. I appreciate nephrology's help.   Permanent atrial fibrillation 10/12 resumed Eliquis 5 BID   Type 2 diabetes Diet controlled   Iron deficiency, iron saturation 4%, started Venofer 200 mg IV daily for 5 days, followed by oral supplement at discharge. Vitamin B12 level 284, target >400, started vitamin B12 1000 mcg IM injection x2 doses followed by 500 mcg p.o. daily Folic acid level 8.3, at lower end, started oral supplement to prevent deficiency  I have seen and examined this patient myself. I have spent 32 minutes in her evaluation and care.   DVT prophylaxis:.on Eliquis Code Status: Full Family Communication: Plan discussed with patient and husband at  bedside  Disposition Plan: Home, still on IV Lasix infusion, may require 3 to 5 days for IV diuresis and then plan to discharge home.  Consults called: Nephrology, PCCM, cardiology, Admission status: inpatient   Level of care: Progressive Cardiac   Status is: Inpatient   Remains inpatient appropriate because:Inpatient level of care appropriate due to severity of illness   Dispo: The patient is from: Home              Anticipated d/c is to: Home              Patient currently is not medically stable to d/c.              Difficult to place patient No   Val Riles, MD [facility]

## 2021-08-03 NOTE — Progress Notes (Signed)
Physical Therapy Treatment Patient Details Name: Karen Farley MRN: [id number] DOB: 11-09-1945 Today's Date: 08/03/2021   History of Present Illness Pt is a 75 y/o F admitted on 07/26/21 after presenting with c/o increasing SOB & edema. Pt reports she had chest x-ray with PCP showing recurrence of left-sided pleural effusion was in the process of being referred to pulmonology for thoracentesis but could not wait for the appointment due to worsening symptoms. Pulmonology was consulted & pt had thoracentesis with removal of 600 cc of fluid. Pt is being treated for acute on chronic diastolic CHF &  anasarca. PMH: HFpEF, permanent A-fib on eliquis, PAD, DM2, CKD stage 3B, lymphadema    PT Comments    The patient presents this session with willingness to trial gait outside of the room. She is able to tolerate 6' with RW. She reports increased RPE to 7/10 with increased gait distances, however is able to recover in 3 min and is educated on needs for continued progression. The pt reports understanding.    Recommendations for follow up therapy are one component of a multi-disciplinary discharge planning process, led by the attending physician.  Recommendations may be updated based on patient status, additional functional criteria and insurance authorization.  Follow Up Recommendations  Home health PT;Supervision for mobility/OOB     Equipment Recommendations  Rolling walker with 5" wheels    Recommendations for Other Services       Precautions / Restrictions Precautions Precautions: Fall     Mobility  Bed Mobility               General bed mobility comments: Not assessed pt in bedside chair upon entry.    Transfers Overall transfer level: Needs assistance Equipment used: 1 person hand held assist Transfers: Sit to/from Stand Sit to Stand: Min assist            Ambulation/Gait Ambulation/Gait assistance: Supervision Gait Distance (Feet): 60 Feet Assistive device:  Rolling walker (2 wheeled) Gait Pattern/deviations: Wide base of support     General Gait Details: Pt with improved balance and mobility with use of RW, trialed HHA, however pt educated on safety concerns and willing to trial RW.   Stairs             Wheelchair Mobility    Modified Rankin (Stroke Patients Only)       Balance Overall balance assessment: Needs assistance   Sitting balance-Leahy Scale: Good     Standing balance support: During functional activity Standing balance-Leahy Scale: Fair                              Cognition Arousal/Alertness: Awake/alert Behavior During Therapy: WFL for tasks assessed/performed Overall Cognitive Status: Within Functional Limits for tasks assessed                                        Exercises      General Comments        Pertinent Vitals/Pain Pain Assessment: No/denies pain Pain Score: 0-No pain    Home Living                      Prior Function            PT Goals (current goals can now be found in the care plan section) Acute Rehab PT Goals Patient  Stated Goal: to regain independence PT Goal Formulation: With patient Time For Goal Achievement: 08/12/21 Potential to Achieve Goals: Good Progress towards PT goals: Progressing toward goals    Frequency    Min 2X/week      PT Plan Current plan remains appropriate    Co-evaluation              AM-PAC PT "6 Clicks" Mobility   Outcome Measure  Help needed turning from your back to your side while in a flat bed without using bedrails?: A Little Help needed moving from lying on your back to sitting on the side of a flat bed without using bedrails?: A Little Help needed moving to and from a bed to a chair (including a wheelchair)?: A Little Help needed standing up from a chair using your arms (e.g., wheelchair or bedside chair)?: A Little Help needed to walk in hospital room?: A Little Help needed climbing  3-5 steps with a railing? : A Lot 6 Click Score: 17    End of Session   Activity Tolerance: Patient limited by fatigue (RPE of 7/10 following gait training; recover to 3/10 within 3 min.) Patient left: in chair;with call bell/phone within reach;with family/visitor present Nurse Communication: Mobility status PT Visit Diagnosis: Muscle weakness (generalized) (M62.81);Difficulty in walking, not elsewhere classified (R26.2);Unsteadiness on feet (R26.81)     Time: 6256-3893 PT Time Calculation (min) (ACUTE ONLY): 28 min  Charges:  $Gait Training: 23-37 mins                     12:19 PM, 08/03/21 [name] PT, DPT Physical Therapist - [facility]    [name] 08/03/2021, 12:18 PM

## 2021-08-03 NOTE — Progress Notes (Signed)
Inpatient Diabetes Program Recommendations  AACE/ADA: New Consensus Statement on Inpatient Glycemic Control (2015)  Target Ranges:  Prepandial:   less than 140 mg/dL      Peak postprandial:   less than 180 mg/dL (1-2 hours)      Critically ill patients:  140 - 180 mg/dL  Results for Karen Farley, Karen Farley (MRN [id number]) as of 08/03/2021 08:11  Ref. Range 08/02/2021 07:34 08/02/2021 11:33 08/02/2021 16:43 08/02/2021 21:22  Glucose-Capillary Latest Ref Range: 70 - 99 mg/dL 234 (H) 312 (H) 279 (H) 280 (H)  Results for Karen Farley, Karen Farley (MRN [id number]) as of 08/03/2021 08:11  Ref. Range 08/03/2021 07:40  Glucose-Capillary Latest Ref Range: 70 - 99 mg/dL 253 (H)     Home DM Meds: Tradjenta 5 mg daily  Current Orders: Novolog 0-9 units TID ac/hs          MD- Note patient Getting Solucortef 100 mg BID   CBGs remain elevated.   Please consider starting Levemir 10 units Daily (0.1 units/kg)    --Will follow patient during hospitalization--  Wyn Quaker RN, MSN, CDE Diabetes Coordinator Inpatient Glycemic Control Team Team Pager: [phone] (8a-5p)

## 2021-08-03 NOTE — Care Management Important Message (Signed)
Important Message  Patient Details  Name: Karen Farley MRN: [id number] Date of Birth: [birth year]   Medicare Important Message Given:  Yes     Dannette Barbara 08/03/2021, 2:32 PM

## 2021-08-03 NOTE — Progress Notes (Addendum)
[facility]  ROUNDING NOTE   Subjective:   Karen Farley was admitted to [facility] on 07/26/2021 for Morbid obesity ([place]) [E66.01] DOE (dyspnea on exertion) [R06.09] Recurrent left pleural effusion [J90] Hypotension [I95.9] Type 2 diabetes mellitus without complication, without long-term current use of insulin ([place]) [E11.9] Atrial fibrillation, unspecified type ([place]) [I48.91] Chronic congestive heart failure, unspecified heart failure type [facility]) [I50.9]  Patient was last seen by my partner, Dr. Candiss Norse, on 05/31/21. Where torsemide was restarted and to continue metolazone.   Patient admitted with shortness of breath and increasing peripheral edema. She was then started on IV furosemide. Left thoracentesis done yesterday on 07/28/21  650mL of pleural fluid.   Patient seen sitting in chair, husband at bedside States she was up all night going to the bathroom But states she feels better and can tell a difference Denies shortness of breath Tolerating small meals  Objective:  Vital signs in last 24 hours:  Temp:  [97.6 F (36.4 C)-98.3 F (36.8 C)] 98.3 F (36.8 C) (10/14 1114) Pulse Rate:  [84-103] 84 (10/14 1114) Resp:  [16-18] 18 (10/14 1114) BP: (106-150)/(61-116) 120/82 (10/14 1114) SpO2:  [93 %-96 %] 96 % (10/14 1114) Weight:  [104.2 kg] 104.2 kg (10/14 0507)  Weight change: -0.4 kg Filed Weights   08/01/21 0339 08/02/21 0502 08/03/21 0507  Weight: 105.2 kg 104.6 kg 104.2 kg    Intake/Output: I/O last 3 completed shifts: In: 1361.2 [P.O.:1200; I.V.:60.2; IV Piggyback:101] Out: 2475 [Urine:2475]   Intake/Output this shift:  Total I/O In: 480 [P.O.:480] Out: -   Physical Exam: General: NAD  Head: Normocephalic, atraumatic. Moist oral mucosal membranes  Eyes: Anicteric  Lungs:  Clear bilaterally, normal effort  Heart: Regular rate and rhythm  Abdomen:  Soft, nontender  Extremities:  ++ peripheral edema.  Neurologic: Nonfocal, moving all four  extremities  Skin: Bilateral lower extremity erythema and fluid filled blisters, bandaged        Basic Metabolic Panel: Recent Labs  Lab 07/29/21 0425 07/31/21 0815 08/01/21 0434 08/02/21 0432 08/03/21 0454  NA 129* 128* 128* 131* 135  K 3.5 4.8 5.1 4.8 4.1  CL 87* 87* 91* 93* 95*  CO2 32 28 22 27 29   GLUCOSE 152* 205* 227* 253* 280*  BUN 51* 48* 54* 57* 60*  CREATININE 1.52* 1.85* 1.94* 1.84* 1.75*  CALCIUM 9.3 9.5 9.3 9.2 9.2  MG  --   --   --  2.4 2.7*  PHOS  --   --   --  3.5 3.3     Liver Function Tests: No results for input(s): AST, ALT, ALKPHOS, BILITOT, PROT, ALBUMIN in the last 168 hours.  No results for input(s): LIPASE, AMYLASE in the last 168 hours. No results for input(s): AMMONIA in the last 168 hours.  CBC: Recent Labs  Lab 07/28/21 0753 08/01/21 0434 08/02/21 0432 08/03/21 0454  WBC 6.4 11.3* 9.6 8.1  NEUTROABS 4.8 9.9*  --   --   HGB 13.2 13.6 12.9 13.3  HCT 42.2 41.9 40.2 41.0  MCV 75.8* 75.9* 75.3* 75.1*  PLT 354 398 337 332     Cardiac Enzymes: No results for input(s): CKTOTAL, CKMB, CKMBINDEX, TROPONINI in the last 168 hours.  BNP: Invalid input(s): POCBNP  CBG: Recent Labs  Lab 08/02/21 1133 08/02/21 1643 08/02/21 2122 08/03/21 0740 08/03/21 1112  GLUCAP 312* 279* 280* 253* 305*     Microbiology: Results for orders placed or performed during the hospital encounter of 07/26/21  Resp Panel  by RT-PCR (Flu A&B, Covid) Nasopharyngeal Swab     Status: None   Collection Time: 07/26/21  7:19 PM   Specimen: Nasopharyngeal Swab; Nasopharyngeal(NP) swabs in vial transport medium  Result Value Ref Range Status   SARS Coronavirus 2 by RT PCR NEGATIVE NEGATIVE Final    Comment: (NOTE) SARS-CoV-2 target nucleic acids are NOT DETECTED.  The SARS-CoV-2 RNA is generally detectable in upper respiratory specimens during the acute phase of infection. The lowest concentration of SARS-CoV-2 viral copies this assay can detect is 138  copies/mL. A negative result does not preclude SARS-Cov-2 infection and should not be used as the sole basis for treatment or other patient management decisions. A negative result may occur with  improper specimen collection/handling, submission of specimen other than nasopharyngeal swab, presence of viral mutation(s) within the areas targeted by this assay, and inadequate number of viral copies(<138 copies/mL). A negative result must be combined with clinical observations, patient history, and epidemiological information. The expected result is Negative.  Fact Sheet for Patients:  EntrepreneurPulse.com.au  Fact Sheet for Healthcare Providers:  IncredibleEmployment.be  This test is no t yet approved or cleared by the [place] FDA and  has been authorized for detection and/or diagnosis of SARS-CoV-2 by FDA under an Emergency Use Authorization (EUA). This EUA will remain  in effect (meaning this test can be used) for the duration of the COVID-19 declaration under Section 564(b)(1) of the Act, 21 U.S.C.section 360bbb-3(b)(1), unless the authorization is terminated  or revoked sooner.       Influenza A by PCR NEGATIVE NEGATIVE Final   Influenza B by PCR NEGATIVE NEGATIVE Final    Comment: (NOTE) The Xpert Xpress SARS-CoV-2/FLU/RSV plus assay is intended as an aid in the diagnosis of influenza from Nasopharyngeal swab specimens and should not be used as a sole basis for treatment. Nasal washings and aspirates are unacceptable for Xpert Xpress SARS-CoV-2/FLU/RSV testing.  Fact Sheet for Patients: EntrepreneurPulse.com.au  Fact Sheet for Healthcare Providers: IncredibleEmployment.be  This test is not yet approved or cleared by the [place] FDA and has been authorized for detection and/or diagnosis of SARS-CoV-2 by FDA under an Emergency Use Authorization (EUA). This EUA will remain in effect (meaning  this test can be used) for the duration of the COVID-19 declaration under Section 564(b)(1) of the Act, 21 U.S.C. section 360bbb-3(b)(1), unless the authorization is terminated or revoked.  Performed at [facility], [address] [place], [place] [address]   Body fluid culture w Gram Stain     Status: None   Collection Time: 07/28/21  1:53 PM   Specimen: PATH Cytology Pleural fluid  Result Value Ref Range Status   Specimen Description   Final    PLEURAL Performed at [facility], [address], [place], [place] [address]    Special Requests   Final    NONE Performed at [facility], [address] [place], [place] [address]    Gram Stain   Final    NO SQUAMOUS EPITHELIAL CELLS SEEN FEW WBC SEEN NO ORGANISMS SEEN    Culture   Final    NO GROWTH 3 DAYS Performed at [facility], [place] [address], [place],  [address]    Report Status 08/01/2021 FINAL  Final  Body fluid culture w Gram Stain     Status: None (Preliminary result)   Collection Time: 08/01/21  3:36 PM   Specimen: PATH Cytology Peritoneal fluid  Result Value Ref Range Status  Specimen Description   Final    PERITONEAL Performed at [facility], [address] [place], [place] [address]    Special Requests   Final    NONE Performed at [facility], [place], [place] [address]    Gram Stain   Final    RARE WBC PRESENT,BOTH PMN AND MONONUCLEAR NO ORGANISMS SEEN    Culture   Final    NO GROWTH 2 DAYS Performed at [facility], [place] [address], [place], [place] [address]    Report Status PENDING  Incomplete    Coagulation Studies: No results for input(s): LABPROT, INR in the last 72 hours.  Urinalysis: No results for input(s): COLORURINE, LABSPEC, PHURINE, GLUCOSEU, HGBUR, BILIRUBINUR, KETONESUR, PROTEINUR, UROBILINOGEN, NITRITE, LEUKOCYTESUR in the last 72 hours.  Invalid input(s): APPERANCEUR    Imaging: US  Paracentesis  Result Date: 08/01/2021 INDICATION: Ascites request received for diagnostic and therapeutic paracentesis. EXAM: ULTRASOUND GUIDED THERAPEUTIC PARACENTESIS MEDICATIONS: Local 1% lidocaine only. COMPLICATIONS: None immediate. PROCEDURE: Informed written consent was obtained from the patient after a discussion of the risks, benefits and alternatives to treatment. A timeout was performed prior to the initiation of the procedure. Initial ultrasound scanning demonstrates a small amount of ascites within the left lower abdominal quadrant. The left lower abdomen was prepped and draped in the usual sterile fashion. 1% lidocaine was used for local anesthesia. Following this, a 19 gauge, 10-cm, Yueh catheter was introduced. An ultrasound image was saved for documentation purposes. The paracentesis was performed. The catheter was removed and a dressing was applied. The patient tolerated the procedure well without immediate post procedural complication. FINDINGS: A total of approximately 1.2 L of amber colored slightly cloudy fluid was removed. Samples were sent to the laboratory as requested by the clinical team. IMPRESSION: Successful ultrasound-guided paracentesis yielding 1.2 liters of peritoneal fluid. Read By: Tsosie Billing PA-C Electronically Signed   By: Jerilynn Mages.  Shick M.D.   On: 08/01/2021 15:59   US Abdomen Limited RUQ (LIVER/GB)  Result Date: 08/01/2021 CLINICAL DATA:  75 year old female with history of cirrhosis. EXAM: ULTRASOUND ABDOMEN LIMITED RIGHT UPPER QUADRANT COMPARISON:  No prior. FINDINGS: Gallbladder: Status post cholecystectomy. Common bile duct: Diameter: 4 mm Liver: No focal lesion identified. Within normal limits in parenchymal echogenicity. Portal vein is patent on color Doppler imaging with normal direction of blood flow towards the liver. Other: None. IMPRESSION: 1. No acute findings. 2. Status post cholecystectomy. Electronically Signed   By: Vinnie Langton M.D.   On: 08/01/2021  18:07     Medications:    albumin human 12.5 g (08/03/21 0844)   furosemide (LASIX) 200 mg in dextrose 5% 100 mL (2mg /mL) infusion 6 mg/hr (08/02/21 1500)   iron sucrose 200 mg (08/03/21 0844)    apixaban  5 mg Oral BID   folic acid  1 mg Oral Daily   insulin aspart  0-5 Units Subcutaneous QHS   insulin aspart  0-9 Units Subcutaneous TID WC   latanoprost  1 drop Both Eyes QHS   midodrine  5 mg Oral TID WC   polyethylene glycol  17 g Oral Daily   Ensure Max Protein  11 oz Oral BID   [START ON 08/04/2021] vitamin B-12  500 mcg Oral Daily     Assessment/ Plan:  Karen Farley is a 75 y.o. white female with diastolic congestive heart failure, hypertension, hyperlipidemia, lymphedema, atrial fibrillation, peripheral vascular disease who is admitted to [facility] on 07/26/2021 for Morbid obesity (  [place]) [E66.01] DOE (dyspnea on exertion) [R06.09] Recurrent left pleural effusion [J90] Hypotension [I95.9] Type 2 diabetes mellitus without complication, without long-term current use of insulin ([facility]) [E11.9] Atrial fibrillation, unspecified type ([place]) [I48.91] Chronic congestive heart failure, unspecified heart failure type ([place]) [I50.9]  Acute kidney injury on chronic kidney disease stage IIIB: with baseline creatinine of 1.66, GFR of 36 on 07/13/21. History of bland urine. 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 Hold benazepril and spironolactone.  Attempted IV lasix without sustained success.Daily weights indicate steady increase in weight daily. Transitioned to Lasix drip at 6mg /hr.with Albumin twice daily.  - Recorded UOP 1.5L in last 24 hours.  -Will consider increasing Metolazone to twice weekly at discharge. -Continue current treatment, will add Ensure twice a day between meals  Hypotension: on furosemide drip and midodrine. BP stable 120/80. Maxed out on midodrine dose. Metoprolol discontinued  Acute exacerbation of diastolic congestive heart failure:  - IV  Furosemide drip as above -Continue fluid restriction  Hyponatremia: with hypervolumia.   - Expect improvement with Lasix drip and fluid removal  -Improved to 135 today - Continue fluid restriction.  - metolazone as above.   5. Left pleural effusion: Thoracentesis on 07/28/21 with 674ml removed. Paracentesis on 08/01/21 with 1.2L removed. Solucortef ordered. Pulmonology following   LOS: 8 [place] 10/14/20221:23 PM

## 2021-08-04 ENCOUNTER — Encounter: Payer: Self-pay | Admitting: Family Medicine

## 2021-08-04 ENCOUNTER — Inpatient Hospital Stay: Payer: Medicare PPO

## 2021-08-04 DIAGNOSIS — I5033 Acute on chronic diastolic (congestive) heart failure: Secondary | ICD-10-CM | POA: Diagnosis not present

## 2021-08-04 DIAGNOSIS — E1122 Type 2 diabetes mellitus with diabetic chronic kidney disease: Secondary | ICD-10-CM | POA: Diagnosis not present

## 2021-08-04 DIAGNOSIS — I9589 Other hypotension: Secondary | ICD-10-CM | POA: Diagnosis not present

## 2021-08-04 DIAGNOSIS — I4819 Other persistent atrial fibrillation: Secondary | ICD-10-CM | POA: Diagnosis not present

## 2021-08-04 DIAGNOSIS — D62 Acute posthemorrhagic anemia: Secondary | ICD-10-CM | POA: Diagnosis not present

## 2021-08-04 LAB — CBC
HCT: 38.4 % (ref 36.0–46.0)
HCT: 30 % — ABNORMAL LOW (ref 36.0–46.0)
Hemoglobin: 12.4 g/dL (ref 12.0–15.0)
Hemoglobin: 9.7 g/dL — ABNORMAL LOW (ref 12.0–15.0)
MCH: 24.2 pg — ABNORMAL LOW (ref 26.0–34.0)
MCH: 24.6 pg — ABNORMAL LOW (ref 26.0–34.0)
MCHC: 32.3 g/dL (ref 30.0–36.0)
MCHC: 32.3 g/dL (ref 30.0–36.0)
MCV: 74.9 fL — ABNORMAL LOW (ref 80.0–100.0)
MCV: 75.9 fL — ABNORMAL LOW (ref 80.0–100.0)
Platelets: 311 10*3/uL (ref 150–400)
Platelets: 288 10*3/uL (ref 150–400)
RBC: 5.13 MIL/uL — ABNORMAL HIGH (ref 3.87–5.11)
RBC: 3.95 MIL/uL (ref 3.87–5.11)
RDW: 18.6 % — ABNORMAL HIGH (ref 11.5–15.5)
RDW: 18.3 % — ABNORMAL HIGH (ref 11.5–15.5)
WBC: 9 10*3/uL (ref 4.0–10.5)
WBC: 10.9 10*3/uL — ABNORMAL HIGH (ref 4.0–10.5)
nRBC: 0 % (ref 0.0–0.2)
nRBC: 0.4 % — ABNORMAL HIGH (ref 0.0–0.2)

## 2021-08-04 LAB — MRSA NEXT GEN BY PCR, NASAL: MRSA by PCR Next Gen: NOT DETECTED

## 2021-08-04 LAB — BASIC METABOLIC PANEL
Anion gap: 12 (ref 5–15)
BUN: 62 mg/dL — ABNORMAL HIGH (ref 8–23)
CO2: 31 mmol/L (ref 22–32)
Calcium: 8.9 mg/dL (ref 8.9–10.3)
Chloride: 93 mmol/L — ABNORMAL LOW (ref 98–111)
Creatinine, Ser: 1.75 mg/dL — ABNORMAL HIGH (ref 0.44–1.00)
GFR, Estimated: 30 mL/min — ABNORMAL LOW (ref >60)
Glucose, Bld: 214 mg/dL — ABNORMAL HIGH (ref 70–99)
Potassium: 3.8 mmol/L (ref 3.5–5.1)
Sodium: 136 mmol/L (ref 135–145)

## 2021-08-04 LAB — GLUCOSE, CAPILLARY
Glucose-Capillary: 211 mg/dL — ABNORMAL HIGH (ref 70–99)
Glucose-Capillary: 230 mg/dL — ABNORMAL HIGH (ref 70–99)
Glucose-Capillary: 256 mg/dL — ABNORMAL HIGH (ref 70–99)
Glucose-Capillary: 252 mg/dL — ABNORMAL HIGH (ref 70–99)
Glucose-Capillary: 212 mg/dL — ABNORMAL HIGH (ref 70–99)

## 2021-08-04 LAB — MAGNESIUM: Magnesium: 2.2 mg/dL (ref 1.7–2.4)

## 2021-08-04 LAB — HEMOGLOBIN AND HEMATOCRIT, BLOOD
HCT: 24 % — ABNORMAL LOW (ref 36.0–46.0)
Hemoglobin: 7.7 g/dL — ABNORMAL LOW (ref 12.0–15.0)

## 2021-08-04 LAB — ABO/RH: ABO/RH(D): A POS

## 2021-08-04 LAB — PREPARE RBC (CROSSMATCH)

## 2021-08-04 LAB — PHOSPHORUS: Phosphorus: 2.7 mg/dL (ref 2.5–4.6)

## 2021-08-04 MED ORDER — SODIUM CHLORIDE 0.9 % IV SOLN
250.0000 mL | INTRAVENOUS | Status: DC
  Administered 2021-08-04 – 2021-08-18 (×3): 250 mL via INTRAVENOUS

## 2021-08-04 MED ORDER — SODIUM CHLORIDE 0.9% IV SOLUTION: Freq: Once | INTRAVENOUS | Status: AC

## 2021-08-04 MED ORDER — SODIUM CHLORIDE 0.9 % IV BOLUS
500.0000 mL | Freq: Once | INTRAVENOUS | Status: AC
  Administered 2021-08-04: 500 mL via INTRAVENOUS

## 2021-08-04 MED ORDER — METOPROLOL SUCCINATE ER 50 MG PO TB24
25.0000 mg | ORAL_TABLET | Freq: Every day | ORAL | Status: DC
  Administered 2021-08-04: 25 mg via ORAL
  Filled 2021-08-04: qty 1

## 2021-08-04 MED ORDER — IOHEXOL 350 MG/ML SOLN
75.0000 mL | Freq: Once | INTRAVENOUS | Status: AC | PRN
  Administered 2021-08-04: 75 mL via INTRAVENOUS

## 2021-08-04 MED ORDER — PHENYLEPHRINE HCL-NACL 20-0.9 MG/250ML-% IV SOLN
25.0000 ug/min | INTRAVENOUS | Status: DC
  Administered 2021-08-04: 25 ug/min via INTRAVENOUS
  Administered 2021-08-05: 45 ug/min via INTRAVENOUS
  Administered 2021-08-06: 90 ug/min via INTRAVENOUS
  Filled 2021-08-04 (×4): qty 250

## 2021-08-04 MED ORDER — SODIUM CHLORIDE 0.9 % IV BOLUS
1000.0000 mL | Freq: Once | INTRAVENOUS | Status: AC
  Administered 2021-08-04: 1000 mL via INTRAVENOUS

## 2021-08-04 MED ORDER — PANTOPRAZOLE SODIUM 40 MG IV SOLR
40.0000 mg | INTRAVENOUS | Status: DC
  Administered 2021-08-04: 40 mg via INTRAVENOUS
  Filled 2021-08-04: qty 40

## 2021-08-04 MED ORDER — CHLORHEXIDINE GLUCONATE CLOTH 2 % EX PADS
6.0000 | MEDICATED_PAD | Freq: Every day | CUTANEOUS | Status: DC
  Administered 2021-08-04 – 2021-08-12 (×9): 6 via TOPICAL

## 2021-08-04 NOTE — Progress Notes (Signed)
  Chaplain On-Call responded to Rapid Response notification at 1133.  Medical team was attending to the patient and reported that she has been stabilized and is awaiting further interventions.  Chaplain will be available for support as needed.  Chaplain Pollyann Samples M.Div., [facility]

## 2021-08-04 NOTE — Progress Notes (Signed)
Pt on toilet when she showed signs of GI bleed. Report to the MD. 10 min later while still on the commode patient becomes less responsive and stated she felt "fuzzy headed." Transferred patient to bed and she seemed to have lost consciousness but not all the way. Still able to speak words and respond to pain. RRT initiated in anticipation that it may get worse. Once in bed and normalized pt seemed to be doing fine. Nephro MD happened to come in within a few minutes. Orders given and continue to monitor

## 2021-08-04 NOTE — Progress Notes (Signed)
Pt latest hgb = 7.7 from previous results today of 9.7 and 12.4. Pt pale, hypotensive and tachycardic. Repeat BP in multiple areas consistently low. Pt has large bloody BM in her bed and per report had an additional large bloody BM earlier in the day. Pt A/ox4 but becoming more lethargic during assesment. Dr. Damita Dunnings paged and rapid response initiated. New orders received, patient transferred to [place].

## 2021-08-04 NOTE — Progress Notes (Signed)
[facility]  ROUNDING NOTE   Subjective:   [name] was admitted to [facility] on 07/26/2021 for Morbid obesity ([place]) [E66.01] DOE (dyspnea on exertion) [R06.09] Recurrent left pleural effusion [J90] Hypotension [I95.9] Type 2 diabetes mellitus without complication, without long-term current use of insulin ([place]) [E11.9] Atrial fibrillation, unspecified type ([place]) [I48.91] Chronic congestive heart failure, unspecified heart failure type [facility]) [I50.9]  Patient was last seen by my partner, Dr. Candiss Norse, on 05/31/21. Where torsemide was restarted and to continue metolazone.   Patient admitted with shortness of breath and increasing peripheral edema. She was then started on IV furosemide. Left thoracentesis done yesterday on 07/28/21  638mL of pleural fluid.   Patient sitting up in chair, alert and oriented Tolerating meals appropriately, denies nausea, vomiting, diarrhea States she continues to void due to Lasix drip, denies lightheadedness or dizziness. Patient seen later in a.m. per nursing, patient became lightheaded and weak while using restroom.  Also reports small blood trail from chair to bathroom.  Currently obtaining vital signs  Objective:  Vital signs in last 24 hours:  Temp:  [97.9 F (36.6 C)-98.4 F (36.9 C)] 97.9 F (36.6 C) (10/15 1216) Pulse Rate:  [85-119] 110 (10/15 1216) Resp:  [16-21] 19 (10/15 1219) BP: (106-143)/(60-118) 120/62 (10/15 1216) SpO2:  [92 %-96 %] 96 % (10/15 1216) Weight:  [99.5 kg] 99.5 kg (10/15 0445)  Weight change: -4.726 kg Filed Weights   08/02/21 0502 08/03/21 0507 08/04/21 0445  Weight: 104.6 kg 104.2 kg 99.5 kg    Intake/Output: I/O last 3 completed shifts: In: 1165 [P.O.:1080; I.V.:85] Out: 3500 [Urine:3500]   Intake/Output this shift:  Total I/O In: 0  Out: 900 [Urine:900]  Physical Exam: General: NAD  Head: Normocephalic, atraumatic. Moist oral mucosal membranes  Eyes: Anicteric  Lungs:  Clear  bilaterally, normal effort  Heart: Regular rate and rhythm  Abdomen:  Soft, nontender  Extremities:  ++ peripheral edema.  Neurologic: Nonfocal, moving all four extremities  Skin: Bilateral lower extremity erythema and fluid filled blisters, bandaged        Basic Metabolic Panel: Recent Labs  Lab 07/31/21 0815 08/01/21 0434 08/02/21 0432 08/03/21 0454 08/04/21 0504  NA 128* 128* 131* 135 136  K 4.8 5.1 4.8 4.1 3.8  CL 87* 91* 93* 95* 93*  CO2 28 22 27 29 31   GLUCOSE 205* 227* 253* 280* 214*  BUN 48* 54* 57* 60* 62*  CREATININE 1.85* 1.94* 1.84* 1.75* 1.75*  CALCIUM 9.5 9.3 9.2 9.2 8.9  MG  --   --  2.4 2.7* 2.2  PHOS  --   --  3.5 3.3 2.7     Liver Function Tests: No results for input(s): AST, ALT, ALKPHOS, BILITOT, PROT, ALBUMIN in the last 168 hours.  No results for input(s): LIPASE, AMYLASE in the last 168 hours. No results for input(s): AMMONIA in the last 168 hours.  CBC: Recent Labs  Lab 08/01/21 0434 08/02/21 0432 08/03/21 0454 08/04/21 0504 08/04/21 1233  WBC 11.3* 9.6 8.1 9.0 10.9*  NEUTROABS 9.9*  --   --   --   --   HGB 13.6 12.9 13.3 12.4 9.7*  HCT 41.9 40.2 41.0 38.4 30.0*  MCV 75.9* 75.3* 75.1* 74.9* 75.9*  PLT 398 337 332 311 288     Cardiac Enzymes: No results for input(s): CKTOTAL, CKMB, CKMBINDEX, TROPONINI in the last 168 hours.  BNP: Invalid input(s): POCBNP  CBG: Recent Labs  Lab 08/03/21 1112 08/03/21 1609 08/03/21 2105 08/04/21 0759  08/04/21 1132  GLUCAP 305* 274* 308* 211* 230*     Microbiology: Results for orders placed or performed during the hospital encounter of 07/26/21  Resp Panel by RT-PCR (Flu A&B, Covid) Nasopharyngeal Swab     Status: None   Collection Time: 07/26/21  7:19 PM   Specimen: Nasopharyngeal Swab; Nasopharyngeal(NP) swabs in vial transport medium  Result Value Ref Range Status   SARS Coronavirus 2 by RT PCR NEGATIVE NEGATIVE Final    Comment: (NOTE) SARS-CoV-2 target nucleic acids are NOT  DETECTED.  The SARS-CoV-2 RNA is generally detectable in upper respiratory specimens during the acute phase of infection. The lowest concentration of SARS-CoV-2 viral copies this assay can detect is 138 copies/mL. A negative result does not preclude SARS-Cov-2 infection and should not be used as the sole basis for treatment or other patient management decisions. A negative result may occur with  improper specimen collection/handling, submission of specimen other than nasopharyngeal swab, presence of viral mutation(s) within the areas targeted by this assay, and inadequate number of viral copies(<138 copies/mL). A negative result must be combined with clinical observations, patient history, and epidemiological information. The expected result is Negative.  Fact Sheet for Patients:  EntrepreneurPulse.com.au  Fact Sheet for Healthcare Providers:  IncredibleEmployment.be  This test is no t yet approved or cleared by the [place] FDA and  has been authorized for detection and/or diagnosis of SARS-CoV-2 by FDA under an Emergency Use Authorization (EUA). This EUA will remain  in effect (meaning this test can be used) for the duration of the COVID-19 declaration under Section 564(b)(1) of the Act, 21 U.S.C.section 360bbb-3(b)(1), unless the authorization is terminated  or revoked sooner.       Influenza A by PCR NEGATIVE NEGATIVE Final   Influenza B by PCR NEGATIVE NEGATIVE Final    Comment: (NOTE) The Xpert Xpress SARS-CoV-2/FLU/RSV plus assay is intended as an aid in the diagnosis of influenza from Nasopharyngeal swab specimens and should not be used as a sole basis for treatment. Nasal washings and aspirates are unacceptable for Xpert Xpress SARS-CoV-2/FLU/RSV testing.  Fact Sheet for Patients: EntrepreneurPulse.com.au  Fact Sheet for Healthcare Providers: IncredibleEmployment.be  This test is not yet  approved or cleared by the [place] FDA and has been authorized for detection and/or diagnosis of SARS-CoV-2 by FDA under an Emergency Use Authorization (EUA). This EUA will remain in effect (meaning this test can be used) for the duration of the COVID-19 declaration under Section 564(b)(1) of the Act, 21 U.S.C. section 360bbb-3(b)(1), unless the authorization is terminated or revoked.  Performed at [facility], [address] [place], [place] [address]   Body fluid culture w Gram Stain     Status: None   Collection Time: 07/28/21  1:53 PM   Specimen: PATH Cytology Pleural fluid  Result Value Ref Range Status   Specimen Description   Final    PLEURAL Performed at [facility], [address], [place], [place] [address]    Special Requests   Final    NONE Performed at [facility], [address] [place], [place] [address]    Gram Stain   Final    NO SQUAMOUS EPITHELIAL CELLS SEEN FEW WBC SEEN NO ORGANISMS SEEN    Culture   Final    NO GROWTH 3 DAYS Performed at [facility], [place] [address], [place], [place] [address]    Report Status 08/01/2021 FINAL  Final  Body fluid culture w Gram Stain  Status: None (Preliminary result)   Collection Time: 08/01/21  3:36 PM   Specimen: PATH Cytology Peritoneal fluid  Result Value Ref Range Status   Specimen Description   Final    PERITONEAL Performed at [facility], [address], [place], [place] [address]    Special Requests   Final    NONE Performed at [facility] [address] [place], [place] [address]    Gram Stain   Final    RARE WBC PRESENT,BOTH PMN AND MONONUCLEAR NO ORGANISMS SEEN    Culture   Final    NO GROWTH 3 DAYS Performed at [facility], [place] [address] [place], [place] [address]    Report Status PENDING  Incomplete    Coagulation Studies: No results for input(s): LABPROT, INR in the last 72 hours.  Urinalysis: No results for  input(s): COLORURINE, LABSPEC, PHURINE, GLUCOSEU, HGBUR, BILIRUBINUR, KETONESUR, PROTEINUR, UROBILINOGEN, NITRITE, LEUKOCYTESUR in the last 72 hours.  Invalid input(s): APPERANCEUR    Imaging: No results found.   Medications:    albumin human 12.5 g (08/04/21 0852)   furosemide (LASIX) 200 mg in dextrose 5% 100 mL (2mg /mL) infusion 6 mg/hr (08/03/21 2300)   iron sucrose 200 mg (19/37/90 2409)    folic acid  1 mg Oral Daily   insulin aspart  0-5 Units Subcutaneous QHS   insulin aspart  0-9 Units Subcutaneous TID WC   latanoprost  1 drop Both Eyes QHS   metoprolol succinate  25 mg Oral Daily   midodrine  5 mg Oral TID WC   polyethylene glycol  17 g Oral Daily   Ensure Max Protein  11 oz Oral BID   vitamin B-12  500 mcg Oral Daily     Assessment/ Plan:  [name] is a 75 y.o. white female with diastolic congestive heart failure, hypertension, hyperlipidemia, lymphedema, atrial fibrillation, peripheral vascular disease who is admitted to [facility] on 07/26/2021 for Morbid obesity ([place]) [E66.01] DOE (dyspnea on exertion) [R06.09] Recurrent left pleural effusion [J90] Hypotension [I95.9] Type 2 diabetes mellitus without complication, without long-term current use of insulin ([facility]) [E11.9] Atrial fibrillation, unspecified type ([place]) [I48.91] Chronic congestive heart failure, unspecified heart failure type ([place]) [I50.9]  Acute kidney injury on chronic kidney disease stage IIIB: with baseline creatinine of 1.66, GFR of 36 on 07/13/21. History of bland urine. 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 Hold benazepril and spironolactone.  Attempted IV lasix without sustained success.Daily weights indicate steady increase in weight daily. Transitioned to Lasix drip at 6mg /hr.with Albumin twice daily.  - Recorded UOP 2.6L in last 24 hours.  -Due to recent events including lightheadedness, dizziness, possible GI bleed, will hold Lasix drip to allow for further  evaluation and prevent excessive volume loss.  Can be restarted once recent events are evaluated.  Nursing has notified primary physician  Hypotension: BP stable 120/62. Maxed out on midodrine dose. Metoprolol discontinued.  Furosemide held at this time  Acute exacerbation of diastolic congestive heart failure:  - IV Furosemide held -Continue fluid restriction  Hyponatremia: with hypervolumia.   -We will continue to monitor due to held furosemide - Continue fluid restriction.  - metolazone as above.   5. Left pleural effusion: Thoracentesis on 07/28/21 with 673ml removed. Paracentesis on 08/01/21 with 1.2L removed. Solucortef ordered. Pulmonology following   LOS: 9 [place] 10/15/20221:10 PM

## 2021-08-04 NOTE — Progress Notes (Signed)
  Chaplain On-Call responded to Rapid Response notification at [place].  Met patient's husband in hallway while medical team was attending to patient .  Provided spiritual and emotional support for patient's husband as preparations were made to transfer the patient.  Patient was moved to [place]. Chaplain is available for additional support as needed.  Chaplain Pollyann Samples M.Div., [facility]

## 2021-08-04 NOTE — Progress Notes (Signed)
Consult placed to start IV. Upon arrival IV already obtained by staff RN.

## 2021-08-04 NOTE — Consult Note (Signed)
[facility] GI Inpatient Consult Note   Kathline Magic, M.D.  Reason for Consult: Acute Lower GI bleed   Attending Requesting Consult: Nicolette Bang, M.D.  Outpatient Primary Physician: Park Liter, D.O. ([place]).  History of Present Illness: Karen Farley is a 75 y.o. female with a history of lymphedema, CKD, type 2 diabetes mellitus, atrial fibrillation, obesity and HFpEF is admitted for progressive dyspnea, peripheral edema, pleural effusions, ascites and is s/p thoracentesis and paracentesis. GI service called today for reported large, painless "Red bloody bowel movement" per RN and patient this AM. Patient claims weakness but no syncope or presyncopal episodes. Patient denies previous GI bleeding issues in the past. Patient reports last colonoscopy was greater than 10 years ago and recalled as 'normal' per patient. She specifically denied any polyps were noted or removed. She is unaware of a diagnosis of diverticulosis. From the Upper GI standpoint, Patient denies intractable heartburn, dysphagia, hemetemesis, abdominal pain, nausea or vomiting.   Due to the bleeding episode, the patient's Eliquis was held after her morning dose today. Pre-bleeding Hgb was 12.4 but decreased afterward to 9.7. There have been no recurrent bleeding episodes in the last several hours.  Past Medical History:  Past Medical History:  Diagnosis Date   Chronic heart failure with preserved ejection fraction (HFpEF) ([place])    a. 11/2019 Echo: EF 60-65%, no rwma, Nl RV size/fxn. Mod dil LA.   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)    DDD (degenerative disc disease), lumbar    Degenerative disc disease, lumbar    bulging and dengerated   Diabetes mellitus without complication (HCC)    Hyperlipidemia    Hypertension    Lymphedema    Morbid obesity ([place])    Osteoarthritis of both knees    Osteopenia     Problem List: Patient Active Problem List   Diagnosis  Date Noted   Anasarca    Stage 3b chronic kidney disease ([place]) 05/17/2021   Pleural effusion 05/17/2021   Acute on chronic diastolic CHF (congestive heart failure) ([place]) 05/17/2021   Obesity, Class III, BMI 40-49.9 (morbid obesity) ([place]) 05/17/2021   Chronic anticoagulation 05/17/2021   Hypotension 04/11/2021   Acute kidney injury superimposed on CKD ([place]) 04/11/2021   Hyponatremia 04/11/2021   (HFpEF) heart failure with preserved ejection fraction ([place]) 04/11/2021   Chronic venous insufficiency 08/14/2020   PAD (peripheral artery disease) ([place]) 08/14/2020   Bilateral primary osteoarthritis of knee 04/13/2020   Type 2 diabetes mellitus ([place]) 04/13/2020   Acquired spondylolisthesis 12/17/2019   Osteoarthritis of knee 12/17/2019   Lumbar radiculopathy 12/17/2019   Atrial fibrillation ([place]) 11/20/2019   Closed fracture of radial styloid 08/06/2019   Displaced fracture of unspecified radial styloid process, initial encounter for closed fracture 08/06/2019   Controlled substance agreement signed 01/24/2018   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) 01/24/2018   Piriformis syndrome 07/01/2016   Sacroiliac joint dysfunction 01/09/2016   Joint disorder, unspecified 01/09/2016   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 diabetes mellitus ([place]) 04/28/2015   Lymphedema 03/27/2015   Edema 03/27/2015   Open-angle glaucoma, mild stage    Osteoarthritis of both knees    Type 2 diabetes mellitus with renal complication (HCC)    Osteopenia    Benign hypertensive renal disease    Hyperlipidemia    Degeneration of lumbosacral intervertebral disc     Past Surgical History: Past Surgical History:  Procedure Laterality Date   CHOLECYSTECTOMY  DILATION AND CURETTAGE OF UTERUS     TEAR DUCT PROBING WITH STRABISMUS REPAIR Right    TONSILLECTOMY      Allergies: No Known Allergies  Home Medications: Medications Prior to Admission  Medication Sig Dispense Refill Last Dose   ACCU-CHEK GUIDE test  strip USE TO CHECK BLOOD SUGAR DAILY 100 strip 3 07/26/2021   apixaban (ELIQUIS) 5 MG TABS tablet Take 1 tablet (5 mg total) by mouth 2 (two) times daily. 180 tablet 3 07/26/2021   latanoprost (XALATAN) 0.005 % ophthalmic solution Place 1 drop into both eyes at bedtime.   07/25/2021   linagliptin (TRADJENTA) 5 MG TABS tablet Take 1 tablet (5 mg total) by mouth daily. 30 tablet 3 07/26/2021   metoprolol succinate (TOPROL-XL) 50 MG 24 hr tablet TAKE 1 TABLET(50 MG) BY MOUTH DAILY WITH OR IMMEDIATELY FOLLOWING A MEAL 90 tablet 0 07/26/2021   Multiple Vitamin (MULTIVITAMIN WITH MINERALS) TABS tablet Take 1 tablet by mouth daily.   07/26/2021   torsemide (DEMADEX) 20 MG tablet Take 20 mg by mouth daily.   07/26/2021   Elastic Bandages & Supports (MEDICAL COMPRESSION STOCKINGS) MISC by Does not apply route.      Home medication reconciliation was completed with the patient.   Scheduled Inpatient Medications:    folic acid  1 mg Oral Daily   insulin aspart  0-5 Units Subcutaneous QHS   insulin aspart  0-9 Units Subcutaneous TID WC   latanoprost  1 drop Both Eyes QHS   metoprolol succinate  25 mg Oral Daily   midodrine  5 mg Oral TID WC   polyethylene glycol  17 g Oral Daily   Ensure Max Protein  11 oz Oral BID   vitamin B-12  500 mcg Oral Daily    Continuous Inpatient Infusions:    albumin human 12.5 g (08/04/21 0852)   furosemide (LASIX) 200 mg in dextrose 5% 100 mL (2mg /mL) infusion 6 mg/hr (08/03/21 2300)   iron sucrose 200 mg (08/04/21 0913)    PRN Inpatient Medications:  acetaminophen, lip balm  Family History: family history includes Breast cancer in her maternal grandmother and mother; Cancer in her brother and brother; Heart disease in her brother and mother; Heart disease (age of onset: 55) in her father; Osteoporosis in her mother; Parkinson's disease in her brother.   GI Family History: Negative.  Social History:   reports that she has never smoked. She has never used smokeless  tobacco. She reports that she does not currently use alcohol. She reports that she does not use drugs. The patient denies ETOH, tobacco, or drug use.    Review of Systems: Review of Systems - General ROS: positive for  - fatigue negative for - fever or sleep disturbance Psychological ROS: negative Ophthalmic ROS: negative ENT ROS: negative for - headaches, hearing change, or vertigo Allergy and Immunology ROS: negative Hematological and Lymphatic ROS: negative Endocrine ROS: positive for - palpitations and unexpected weight changes negative for - galactorrhea, hair pattern changes, or skin changes Respiratory ROS: positive for - shortness of breath Cardiovascular ROS: positive for - dyspnea on exertion, irregular heartbeat, orthopnea, and shortness of breath negative for - chest pain Genito-Urinary ROS: no dysuria, trouble voiding, or hematuria Musculoskeletal ROS: positive for - joint pain and joint stiffness negative for - joint pain Neurological ROS: no TIA or stroke symptoms Dermatological ROS: negative  Physical Examination: BP 120/62 (BP Location: Right Arm)   Pulse (!) 110   Temp 97.9 F (36.6 C) (Oral)  Resp 19   Ht 5\' 2"  (1.575 m)   Wt 99.5 kg   SpO2 96%   BMI 40.11 kg/m  Physical Exam Constitutional:      General: She is not in acute distress.    Appearance: She is obese. She is ill-appearing. She is not toxic-appearing or diaphoretic.  HENT:     Head: Normocephalic and atraumatic.     Nose: No congestion or rhinorrhea.     Mouth/Throat:     Mouth: Mucous membranes are moist.  Eyes:     General: No scleral icterus.    Conjunctiva/sclera: Conjunctivae normal.     Pupils: Pupils are equal, round, and reactive to light.  Cardiovascular:     Rate and Rhythm: Rhythm irregular.     Pulses: Normal pulses.     Heart sounds:    No gallop.  Pulmonary:     Effort: Pulmonary effort is normal. No respiratory distress.     Breath sounds: No stridor. Rales present. No  rhonchi.  Abdominal:     General: There is distension.     Palpations: There is no mass.     Tenderness: There is no abdominal tenderness. There is no right CVA tenderness, left CVA tenderness, guarding or rebound.     Hernia: No hernia is present.  Musculoskeletal:        General: Swelling present. No tenderness, deformity or signs of injury.     Cervical back: Normal range of motion.     Right lower leg: Edema present.     Left lower leg: Edema present.  Skin:    Capillary Refill: Capillary refill takes less than 2 seconds.     Coloration: Skin is pale.     Findings: No bruising, erythema, lesion or rash.  Neurological:     General: No focal deficit present.     Mental Status: She is alert.  Psychiatric:        Mood and Affect: Mood normal.        Behavior: Behavior normal.        Thought Content: Thought content normal.        Judgment: Judgment normal.    Data: Lab Results  Component Value Date   WBC 10.9 (H) 08/04/2021   HGB 9.7 (L) 08/04/2021   HCT 30.0 (L) 08/04/2021   MCV 75.9 (L) 08/04/2021   PLT 288 08/04/2021   Recent Labs  Lab 08/03/21 0454 08/04/21 0504 08/04/21 1233  HGB 13.3 12.4 9.7*   Lab Results  Component Value Date   NA 136 08/04/2021   K 3.8 08/04/2021   CL 93 (L) 08/04/2021   CO2 31 08/04/2021   BUN 62 (H) 08/04/2021   CREATININE 1.75 (H) 08/04/2021   Lab Results  Component Value Date   ALT 16 07/26/2021   AST 27 07/26/2021   ALKPHOS 68 07/26/2021   BILITOT 2.3 (H) 07/26/2021   No results for input(s): APTT, INR, PTT in the last 168 hours. CBC Latest Ref Rng & Units 08/04/2021 08/04/2021 08/03/2021  WBC 4.0 - 10.5 K/uL 10.9(H) 9.0 8.1  Hemoglobin 12.0 - 15.0 g/dL 9.7(L) 12.4 13.3  Hematocrit 36.0 - 46.0 % 30.0(L) 38.4 41.0  Platelets 150 - 400 K/uL 288 311 332    STUDIES: No results found. @IMAGES @  Assessment:  Acute lower Gastrointestinal bleeding - Ddx includes diverticular bleeding, ischemic colitis, upper GI source (No  localizing symptoms), malignancy, internal hemorrhoids. Posthemorrhagic anemia- hgb 9.7 HFpEF with fluid overload- s/p diuresis, thoracentesis-- Clinically improved, not  yet resolved. 4.  Atrial fibrillation, persistent and chronic - HR in 110's currently. Asymptomatic. Eliquis held d/t GI bleed. 5. Obesity  6. Type II DM w/ metabolic syndrome.  COVID-19 status: Tested Negative  Recommendations:  Continue holding Eliquis as deemed medically feasible. Acid suppression. Serial H/H. Transfuse as needed to preserve oxygen carrying capacity. Colonoscopy +/- EGD when feasible, preferably after > 48hrs of holding Eliquis. The patient understands the nature of the planned procedure, indications, risks, alternatives and potential complications including but not limited to bleeding, infection, perforation, damage to internal organs and possible oversedation/side effects from anesthesia. The patient agrees and gives consent to proceed.  Will continue to follow along closely.  Thank you for the consult. Please call with questions or concerns.  Olean Ree, "Lanny Hurst MD [facility] [place], [place] [phone]  08/04/2021 1:58 PM

## 2021-08-04 NOTE — Progress Notes (Signed)
PROGRESS NOTE    Karen Farley  YWV:[id number] DOB: 01-17-1946 DOA: 07/26/2021 PCP: Valerie Roys, DO   Brief Narrative:  Karen Farley is a 75 y.o. female with medical history significant for HFpEF, permanent atrial fibrillation on Eliquis, PAD, type 2 diabetes, CKD stage IIIb, lymphedema who presents with concerns of increasing shortness of breath and edema.   Patient notes that since her last hospitalization in August she has been having increasing abdominal distention.  Had increasing lower extremity edema on top of her feet for the past 2 weeks.  She also notes worsening dyspnea on exertion for the past several weeks and had chest x-ray with PCP showing recurrence of left-sided pleural effusion.  She was in the process of being referred to pulmonology for thoracentesis but could not wait for the appointment due to worsening symptoms and decided to present to the ED.   Patient last admitted on 7/28-8/7 with Acute on chronic diastolic CHF, left-sided pleural effusion and acute on chronic CKD.  Both cardiology and nephrology was consulted and she was treated with IV diuretic. Had thoracentesis on 8/1 with 400 cc removed likely transudative fluid based on limited lab work  she had metolazone, torsemide and Aldactone held at discharge. She had follow-up with nephrology on 8/11 and had restarted torsemide 20 mg 3 times per week and metolazone 2.5 mg once a week.  Later switched Torsemide every other day since she had discussion with nephrology that it would help make it easier for  her. States she still has good urine output with changes in regimen.  However she has been monitoring her weight daily and has noted a 5lb  weight gain from 218 to 223lb since August. Last dose of Torsemide yesterday.    ED Course: She was afebrile, hypotensive down to 84/62 initially which improved with 500 cc normal saline fluid.  Sodium of 128, K of 2.6, chloride of 79, creatinine of 1.78 around her  baseline, BG 195.  Magnesium of 2.2.  BNP of 154.  Normal AST and ALT.  T bili mildly elevated at 2.6. Chest x-ray showing large left-sided pleural effusion.   Pulmonology consulted for effusion. Patient has gone for thoracentesis with removal of 600 cc     Assessment & Plan:   Principal Problem:   Acute on chronic diastolic CHF (congestive heart failure) (HCC) Active Problems:   Type 2 diabetes mellitus with renal complication (HCC)   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 diabetes mellitus (HCC)   Atrial fibrillation (HCC)   Hypotension   Pleural effusion   Acute on chronic diastolic CHF Anasarca: Worsening edema of lower extremities bilaterally. -Pt on torsemide 20 mg 3 times per week and metolazone 2.5 mg once a week here with large pleural effusion, anasarca and increasing LE edema -Very complex heart failure and CKD patient. Her creatinine has been sensitive to increasing diuresis in the past. Also very susceptible to hypovolemia and hypotension to increasing diuresis.  -needs cardiology and nephrology consult. There was discussion of referring for placement of CardioMEMS by cardiology in the past which needs to be re-addressed.  -recent echo on 04/2021 with EF of 60-65%  -s/p IV Lasix 40 mg IV bid, but diuresis is limited by hypotension.  -Midodrine 10 mg tid, albumin, and solucortef added to increase blood pressure enough to allow for maximal diuresis.  - appreciate the help of PCCM and Nephrology in this patient. 10/12 started Lasix IV infusion, but that is being held today 2/2 hypotension  Cr 1.9 --1.84--1.75 slightly improved  TSH level 0.9 wnl  GI bleed Patient was noted to be mildly hypotensive and dizzy today Had 1 stool that was notable for blood Discussed with GI who will see in consultation Stat CBC ordered with a significant drop to 9.7 from 12.4 Anticoagulation discontinued    Left pleural effusion secondary to diastolic CHF Consult pulm for thoracentesis if unable to  diurese off. S/p left thora 600 cc removed, LIkely transudative.     Hypotension BP low normal. Monitor. This is limiting the ability to diurese this patient. Midodrine 10 mg tid albumin, and solucortef added to increase blood pressure enough to allow for maximal diuresis. Metoprolol XL has been discontinued. 10/13 decreased Solu-Cortef 100 mg every 12 hourly x2 doses and d/c'd 10/14 10/14 decreased dose of midodrine from 10 to 5 mg p.o. 3 times daily with holding parameters, blood pressure seems to be improving   Hypokalemia Resolved. Monitor. Likely due to diuretics Daily oral supplementation   Hyponatremia,  Sodium of 127 likely from diuresis improved to 128. Fluid restriction discontinued. Na 136 resolved   CKD stage 3b -creatinine stable at 1.68 close to baseline around 1.4-1.7. I appreciate nephrology's help.   Permanent atrial fibrillation 10/12 resumed Eliquis 5 BID which as above has been discontinued   Type 2 diabetes Diet controlled   Iron deficiency, iron saturation 4%, started Venofer 200 mg IV daily for 5 days, followed by oral supplement at discharge. Vitamin B12 level 284, target >400, started vitamin B12 1000 mcg IM injection x2 doses followed by 500 mcg p.o. daily Folic acid level 8.3, at lower end, started oral supplement to prevent deficiency    DVT prophylaxis: SCD/Compression stockings  Code Status: full    Code Status Orders  (From admission, onward)           Start     Ordered   07/26/21 2042  Full code  Continuous        07/26/21 2042           Code Status History     Date Active Date Inactive Code Status Order ID Comments User Context   05/17/2021 0253 05/27/2021 1956 Full Code [id number]  [name], MD ED   04/11/2021 0143 04/18/2021 1712 Full Code [id number]  Orene Desanctis, DO ED      Advance Directive Documentation    Flowsheet Row Most Recent Value  Type of Advance Directive Healthcare Power of Attorney, Living will   Pre-existing out of facility DNR order (yellow form or pink MOST form) --  "MOST" Form in Place? --      Family Communication: None at bedside, called husband [phone], N/A Disposition Plan:    Patient shall remain inpatient she is not medically stable for discharge.  We will continue requiring IV diuresis and subspecialty evaluation for declining hemoglobin concerning for GI bleed. Consults called:  Gastroenterology Admission status: Inpatient   Consultants:  As above  Procedures:  DG Chest 2 View  Result Date: 07/20/2021 CLINICAL DATA:  Patient is here to follow up for pleural effusion. Patient complains of cough since April and has fluid retention. Patient denies asthma and is a non smoker, but has a history of a A-fib and has no other lung/heart issues or surgeries. EXAM: CHEST - 2 VIEW COMPARISON:  Radiography of the chest dated 05/21/2021. Radiography of the chest dated 05/20/2021. FINDINGS: Persistent enlargement of the cardiac silhouette. Atherosclerotic calcification of the aortic knob. Moderate-large left-sided pleural effusion with left  basilar atelectasis. Right lung is clear. No pneumothorax. IMPRESSION: Persistent moderate-large left-sided pleural effusion with left basilar atelectasis. Electronically Signed   By: Davina Poke D.O.   On: 07/17/2021 11:03   US Paracentesis  Result Date: 08/01/2021 INDICATION: Ascites request received for diagnostic and therapeutic paracentesis. EXAM: ULTRASOUND GUIDED THERAPEUTIC PARACENTESIS MEDICATIONS: Local 1% lidocaine only. COMPLICATIONS: None immediate. PROCEDURE: Informed written consent was obtained from the patient after a discussion of the risks, benefits and alternatives to treatment. A timeout was performed prior to the initiation of the procedure. Initial ultrasound scanning demonstrates a small amount of ascites within the left lower abdominal quadrant. The left lower abdomen was prepped and draped in the usual sterile fashion. 1%  lidocaine was used for local anesthesia. Following this, a 19 gauge, 10-cm, Yueh catheter was introduced. An ultrasound image was saved for documentation purposes. The paracentesis was performed. The catheter was removed and a dressing was applied. The patient tolerated the procedure well without immediate post procedural complication. FINDINGS: A total of approximately 1.2 L of amber colored slightly cloudy fluid was removed. Samples were sent to the laboratory as requested by the clinical team. IMPRESSION: Successful ultrasound-guided paracentesis yielding 1.2 liters of peritoneal fluid. Read By: Tsosie Billing PA-C Electronically Signed   By: Jerilynn Mages.  Shick M.D.   On: 08/01/2021 15:59   DG Chest Port 1 View  Result Date: 07/28/2021 CLINICAL DATA:  Post left-sided thoracentesis. EXAM: PORTABLE CHEST 1 VIEW COMPARISON:  Earlier same day; 07/26/2021 FINDINGS: Grossly unchanged enlarged cardiac silhouette and mediastinal contours with atherosclerotic plaque within the aortic arch. Interval reduction in persistent trace left-sided effusion post thoracentesis. Improved aeration of left lung base with persistent partial obscuration of the left heart border secondary to left basilar heterogeneous/consolidative opacities. The right hemithorax remains well aerated. Mild point is congestion without frank evidence of edema. No pneumothorax. No acute osseous abnormalities. IMPRESSION: 1. No evidence of complication following left-sided thoracentesis. Specifically, no pneumothorax. 2. Improved aeration of left lung base with persistent left basilar opacities, atelectasis versus infiltrate. 3. Otherwise, similar findings of cardiomegaly and pulmonary venous congestion without frank evidence of edema. 4.  Aortic Atherosclerosis (ICD10-I70.0). Electronically Signed   By: Sandi Mariscal M.D.   On: 07/28/2021 14:04   DG Chest Port 1 View  Result Date: 07/28/2021 CLINICAL DATA:  75 year old female with history of increasing shortness  of breath with low oxygen saturations. EXAM: PORTABLE CHEST 1 VIEW COMPARISON:  Chest x-ray 07/26/2021. FINDINGS: Persistent opacification throughout the left mid to lower hemithorax. Right lung is clear. No definite right pleural effusion. No pneumothorax. Cardiac silhouette is obscured. The patient is rotated to the left on today's exam, resulting in distortion of the mediastinal contours and reduced diagnostic sensitivity and specificity for mediastinal pathology. IMPRESSION: 1. Persistent opacification in the left mid to lower hemithorax compatible with areas of atelectasis and/or consolidation, likely with superimposed large left pleural effusion. 2. Aortic atherosclerosis. Electronically Signed   By: Vinnie Langton M.D.   On: 07/28/2021 10:32   DG Chest Portable 1 View  Result Date: 07/26/2021 CLINICAL DATA:  Shortness of breath EXAM: PORTABLE CHEST 1 VIEW COMPARISON:  07/16/2021 FINDINGS: Large left-sided pleural effusion slightly increased. Probable trace right effusion. Cardiomegaly with vascular congestion. Persistent consolidation in the left lower lung. IMPRESSION: Large left-sided pleural effusion with airspace disease in the left mid to lower lung. Trace right pleural effusion. Cardiomegaly with vascular congestion Electronically Signed   By: Donavan Foil M.D.   On: 07/26/2021 18:10  US Abdomen Limited RUQ (LIVER/GB)  Result Date: 08/01/2021 CLINICAL DATA:  75 year old female with history of cirrhosis. EXAM: ULTRASOUND ABDOMEN LIMITED RIGHT UPPER QUADRANT COMPARISON:  No prior. FINDINGS: Gallbladder: Status post cholecystectomy. Common bile duct: Diameter: 4 mm Liver: No focal lesion identified. Within normal limits in parenchymal echogenicity. Portal vein is patent on color Doppler imaging with normal direction of blood flow towards the liver. Other: None. IMPRESSION: 1. No acute findings. 2. Status post cholecystectomy. Electronically Signed   By: Vinnie Langton M.D.   On: 08/01/2021  18:07   US THORACENTESIS ASP PLEURAL SPACE W/IMG GUIDE  Result Date: 07/28/2021 INDICATION: Symptomatic left-sided pleural effusion. Please from ultrasound-guided thoracentesis for diagnostic and therapeutic purposes. EXAM: US THORACENTESIS ASP PLEURAL SPACE W/IMG GUIDE COMPARISON:  Chest radiograph-earlier same day; 05/20/2021; ultrasound-guided left-sided thoracentesis yielding 400 cc pleural fluid. MEDICATIONS: None. COMPLICATIONS: None immediate. TECHNIQUE: Informed written consent was obtained from the patient after a discussion of the risks, benefits and alternatives to treatment. A timeout was performed prior to the initiation of the procedure. Initial ultrasound scanning demonstrates a small anechoic left-sided pleural effusion. The lower chest was prepped and draped in the usual sterile fashion. 1% lidocaine was used for local anesthesia. Under direct ultrasound guidance, an 8 Fr Safe-T-Centesis catheter was introduced. The thoracentesis was performed. The catheter was removed and a dressing was applied. The patient tolerated the procedure well without immediate post procedural complication. The patient was escorted to have an upright chest radiograph. FINDINGS: A total of approximately 600 cc of serous fluid was removed. Requested samples were sent to the laboratory. IMPRESSION: Successful ultrasound-guided left sided thoracentesis yielding 600 cc of pleural fluid. Electronically Signed   By: Sandi Mariscal M.D.   On: 07/28/2021 14:06      Subjective: Patient reports swelling has decreased somewhat but still has a long way to go Nursing reported lightheadedness and bloody stool movement  Objective: Vitals:   08/04/21 1000 08/04/21 1137 08/04/21 1216 08/04/21 1219  BP:  112/80 120/62   Pulse: (!) 108 (!) 119 (!) 110   Resp:   (!) 21 19  Temp:   97.9 F (36.6 C)   TempSrc:   Oral   SpO2:  95% 96%   Weight:      Height:        Intake/Output Summary (Last 24 hours) at 08/04/2021  1346 Last data filed at 08/04/2021 1026 Gross per 24 hour  Intake 324.99 ml  Output 2800 ml  Net -2475.01 ml   Filed Weights   08/02/21 0502 08/03/21 0507 08/04/21 0445  Weight: 104.6 kg 104.2 kg 99.5 kg    Examination:  General exam: Appears calm and comfortable  Respiratory system: Clear to auscultation. Respiratory effort normal. Cardiovascular system: S1 & S2 heard, RRR. No JVD, murmurs, rubs, gallops or clicks. No pedal edema. Gastrointestinal system: Abdomen is nondistended, soft and nontender. No organomegaly or masses felt. Normal bowel sounds heard. Central nervous system: Alert and oriented. No focal neurological deficits. Extremities: 2-3+ pitting edema bilaterally warm and well perfused35 MIN Skin: No rashes, lesions or ulcers Psychiatry: Judgement and insight appear normal. Mood & affect appropriate.     Data Reviewed: I have personally reviewed following labs and imaging studies  CBC: Recent Labs  Lab 08/01/21 0434 08/02/21 0432 08/03/21 0454 08/04/21 0504 08/04/21 1233  WBC 11.3* 9.6 8.1 9.0 10.9*  NEUTROABS 9.9*  --   --   --   --   HGB 13.6 12.9 13.3 12.4 9.7*  HCT  41.9 40.2 41.0 38.4 30.0*  MCV 75.9* 75.3* 75.1* 74.9* 75.9*  PLT 398 337 332 311 998   Basic Metabolic Panel: Recent Labs  Lab 07/31/21 0815 08/01/21 0434 08/02/21 0432 08/03/21 0454 08/04/21 0504  NA 128* 128* 131* 135 136  K 4.8 5.1 4.8 4.1 3.8  CL 87* 91* 93* 95* 93*  CO2 28 22 27 29 31   GLUCOSE 205* 227* 253* 280* 214*  BUN 48* 54* 57* 60* 62*  CREATININE 1.85* 1.94* 1.84* 1.75* 1.75*  CALCIUM 9.5 9.3 9.2 9.2 8.9  MG  --   --  2.4 2.7* 2.2  PHOS  --   --  3.5 3.3 2.7   GFR: Estimated Creatinine Clearance: 30.7 mL/min (A) (by C-G formula based on SCr of 1.75 mg/dL (H)). Liver Function Tests: No results for input(s): AST, ALT, ALKPHOS, BILITOT, PROT, ALBUMIN in the last 168 hours. No results for input(s): LIPASE, AMYLASE in the last 168 hours. No results for input(s):  AMMONIA in the last 168 hours. Coagulation Profile: No results for input(s): INR, PROTIME in the last 168 hours. Cardiac Enzymes: No results for input(s): CKTOTAL, CKMB, CKMBINDEX, TROPONINI in the last 168 hours. BNP (last 3 results) No results for input(s): PROBNP in the last 8760 hours. HbA1C: No results for input(s): HGBA1C in the last 72 hours. CBG: Recent Labs  Lab 08/03/21 1112 08/03/21 1609 08/03/21 2105 08/04/21 0759 08/04/21 1132  GLUCAP 305* 274* 308* 211* 230*   Lipid Profile: No results for input(s): CHOL, HDL, LDLCALC, TRIG, CHOLHDL, LDLDIRECT in the last 72 hours. Thyroid Function Tests: No results for input(s): TSH, T4TOTAL, FREET4, T3FREE, THYROIDAB in the last 72 hours. Anemia Panel: No results for input(s): VITAMINB12, FOLATE, FERRITIN, TIBC, IRON, RETICCTPCT in the last 72 hours. Sepsis Labs: No results for input(s): PROCALCITON, LATICACIDVEN in the last 168 hours.  Recent Results (from the past 240 hour(s))  Resp Panel by RT-PCR (Flu A&B, Covid) Nasopharyngeal Swab     Status: None   Collection Time: 07/26/21  7:19 PM   Specimen: Nasopharyngeal Swab; Nasopharyngeal(NP) swabs in vial transport medium  Result Value Ref Range Status   SARS Coronavirus 2 by RT PCR NEGATIVE NEGATIVE Final    Comment: (NOTE) SARS-CoV-2 target nucleic acids are NOT DETECTED.  The SARS-CoV-2 RNA is generally detectable in upper respiratory specimens during the acute phase of infection. The lowest concentration of SARS-CoV-2 viral copies this assay can detect is 138 copies/mL. A negative result does not preclude SARS-Cov-2 infection and should not be used as the sole basis for treatment or other patient management decisions. A negative result may occur with  improper specimen collection/handling, submission of specimen other than nasopharyngeal swab, presence of viral mutation(s) within the areas targeted by this assay, and inadequate number of viral copies(<138 copies/mL). A  negative result must be combined with clinical observations, patient history, and epidemiological information. The expected result is Negative.  Fact Sheet for Patients:  EntrepreneurPulse.com.au  Fact Sheet for Healthcare Providers:  IncredibleEmployment.be  This test is no t yet approved or cleared by the [place] FDA and  has been authorized for detection and/or diagnosis of SARS-CoV-2 by FDA under an Emergency Use Authorization (EUA). This EUA will remain  in effect (meaning this test can be used) for the duration of the COVID-19 declaration under Section 564(b)(1) of the Act, 21 U.S.C.section 360bbb-3(b)(1), unless the authorization is terminated  or revoked sooner.       Influenza A by PCR NEGATIVE NEGATIVE Final  Influenza B by PCR NEGATIVE NEGATIVE Final    Comment: (NOTE) The Xpert Xpress SARS-CoV-2/FLU/RSV plus assay is intended as an aid in the diagnosis of influenza from Nasopharyngeal swab specimens and should not be used as a sole basis for treatment. Nasal washings and aspirates are unacceptable for Xpert Xpress SARS-CoV-2/FLU/RSV testing.  Fact Sheet for Patients: EntrepreneurPulse.com.au  Fact Sheet for Healthcare Providers: IncredibleEmployment.be  This test is not yet approved or cleared by the [place] FDA and has been authorized for detection and/or diagnosis of SARS-CoV-2 by FDA under an Emergency Use Authorization (EUA). This EUA will remain in effect (meaning this test can be used) for the duration of the COVID-19 declaration under Section 564(b)(1) of the Act, 21 U.S.C. section 360bbb-3(b)(1), unless the authorization is terminated or revoked.  Performed at [facility], [address] [place], [place] [address]   Body fluid culture w Gram Stain     Status: None   Collection Time: 07/28/21  1:53 PM   Specimen: PATH Cytology Pleural fluid  Result Value  Ref Range Status   Specimen Description   Final    PLEURAL Performed at [facility], [address], [place], [place] [address]    Special Requests   Final    NONE Performed at [facility], [address] [place], [place] [address]    Gram Stain   Final    NO SQUAMOUS EPITHELIAL CELLS SEEN FEW WBC SEEN NO ORGANISMS SEEN    Culture   Final    NO GROWTH 3 DAYS Performed at [facility], [place] [address], [place], [place] [address]    Report Status 08/01/2021 FINAL  Final  Body fluid culture w Gram Stain     Status: None (Preliminary result)   Collection Time: 08/01/21  3:36 PM   Specimen: PATH Cytology Peritoneal fluid  Result Value Ref Range Status   Specimen Description   Final    PERITONEAL Performed at [facility], [address] [place], [place] [address]    Special Requests   Final    NONE Performed at [facility], [address] [place], [place] [address]    Gram Stain   Final    RARE WBC PRESENT,BOTH PMN AND MONONUCLEAR NO ORGANISMS SEEN    Culture   Final    NO GROWTH 3 DAYS Performed at [facility], [place] [address], [place], [place] [address]    Report Status PENDING  Incomplete         Radiology Studies: No results found.      Scheduled Meds:  folic acid  1 mg Oral Daily   insulin aspart  0-5 Units Subcutaneous QHS   insulin aspart  0-9 Units Subcutaneous TID WC   latanoprost  1 drop Both Eyes QHS   metoprolol succinate  25 mg Oral Daily   midodrine  5 mg Oral TID WC   polyethylene glycol  17 g Oral Daily   Ensure Max Protein  11 oz Oral BID   vitamin B-12  500 mcg Oral Daily   Continuous Infusions:  albumin human 12.5 g (08/04/21 0852)   furosemide (LASIX) 200 mg in dextrose 5% 100 mL (2mg /mL) infusion 6 mg/hr (08/03/21 2300)   iron sucrose 200 mg (08/04/21 0913)     LOS: 9 days    Time spent: [place], MD [facility]  If 7PM-7AM, please  contact night-coverage  08/04/2021, 1:46 PM

## 2021-08-04 NOTE — Progress Notes (Addendum)
Cross coverage event  PROGRESS NOTE    PANG ROBERS  OXB:[id number] DOB: [birth year] DOA: 07/26/2021 PCP: Valerie Roys, DO  Event: Rapid response: Patient who was lethargic, tachycardic, hypotensive, dark brown liquid stools.  Hemoglobin 7.7, down from 9.7 about 6 hours prior   Brief Narrative: Karen Farley is a 75 y.o. female with admitted for CHF exacerbation with pleural effusions, ascites and is s/p thoracentesis and paracentesis seen by GI service called today for reported large, painless "Red bloody bowel movement" per RN and patient this AM Hemoglobin has been downtrending 12.4-9.7-7.7 And has become she has now become lethargic and hemodynamically unstable, tachycardic, tachypneic and hypotensive with altered mental status  Assessment & Plan:     Acute blood loss anemia in the setting of chronic anticoagulation, hemodynamically unstable - Normal saline IV bolus stat and placement of 2 large-bore catheters - Transfusion of 2 units PRBCs as soon as available - Transfer to stepdown -: Spoke with GI, Dr. Alice Reichert who recommended stat CTA abdomen and pelvis.  If positive to consult vascular.  Continue above measures and call if any other problems - Patient's last dose of Eliquis was in the a.m. of 10/15 - We will monitor for fluid overload in the setting of admitting diagnosis of heart failure exacerbation with pleural effusion requiring thoracentesis and diuretic treatment - Keep n.p.o.  Addendum: -CTA showing active bleeding in rectum - Intensivist consult placed earlier in case pressors were needed as patient was persistently hypotensive.  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  Holding Eliquis, diuretics.  Fingersticks every 4 as n.p.o.  CRITICAL CARE Performed by: [name]   Total critical care time: 60 minutes  Critical care time was exclusive of separately billable procedures and treating other patients.  Critical care was necessary to treat or prevent imminent or life-threatening deterioration.  Critical care was time spent personally by me on the following activities: development of treatment plan with patient and/or surrogate as well as nursing, discussions with consultants, evaluation of patient's response to treatment, examination of patient, obtaining history from patient or surrogate, ordering and performing treatments and interventions, ordering and review of laboratory studies, ordering and review of radiographic studies, pulse oximetry and re-evaluation of patient's condition.   Subjective: Patient very lethargic but oriented x3.  Feels very weak  Objective: Vitals:   08/04/21 1400 08/04/21 1600 08/04/21 1700 08/04/21 1939  BP: 99/69 101/62  (!) 89/53  Pulse: (!) 112 (!) 107    Resp: (!) 21 19 19    Temp:  98.3 F (36.8 C)    TempSrc:  Oral    SpO2: 96% 95%    Weight:      Height:        Intake/Output Summary (Last 24 hours) at 08/04/2021 2031 Last data filed at 08/04/2021 1421 Gross per 24 hour  Intake 7.02 ml  Output 2000 ml  Net -1992.98 ml   Filed Weights   08/02/21 0502 08/03/21 0507 08/04/21 0445  Weight: 104.6 kg 104.2 kg 99.5 kg    Examination:  General exam: Appears calm, very lethargic, pale Respiratory system: Clear to auscultation. Respiratory effort normal.  Tachypneic Cardiovascular system: S1 & S2 heard, tachycardic. No JVD, murmurs, rubs, gallops or clicks. No pedal edema. Gastrointestinal system: Abdomen is nondistended, soft and nontender. No organomegaly or masses felt. Normal bowel sounds heard. Psychiatry: J Mood & affect appropriate.      Data Reviewed: I have personally reviewed following labs and imaging studies  CBC: Recent Labs  Lab 08/01/21 0434 08/02/21 0432 08/03/21 0454 08/04/21 0504 08/04/21 1233 08/04/21 1845  WBC 11.3* 9.6 8.1 9.0 10.9*  --   NEUTROABS 9.9*  --   --   --   --   --   HGB 13.6 12.9 13.3 12.4 9.7* 7.7*  HCT 41.9 40.2 41.0 38.4 30.0* 24.0*  MCV 75.9* 75.3* 75.1* 74.9* 75.9*  --   PLT 398 337 332 311 288  --    Basic Metabolic Panel: Recent Labs  Lab 07/31/21 0815 08/01/21 0434 08/02/21 0432 08/03/21 0454 08/04/21 0504  NA 128* 128* 131* 135 136  K 4.8 5.1 4.8 4.1 3.8  CL 87* 91* 93* 95* 93*  CO2 28 22 27 29 31   GLUCOSE 205* 227* 253* 280* 214*  BUN 48* 54* 57* 60* 62*  CREATININE 1.85* 1.94* 1.84* 1.75* 1.75*  CALCIUM 9.5 9.3 9.2 9.2 8.9  MG  --   --  2.4 2.7* 2.2  PHOS  --   --  3.5 3.3 2.7   GFR: Estimated Creatinine Clearance: 30.7 mL/min (A) (by C-G formula based on SCr of 1.75 mg/dL (H)). Liver Function Tests: No results for input(s): AST, ALT, ALKPHOS, BILITOT, PROT, ALBUMIN in the last 168 hours. No results for input(s): LIPASE, AMYLASE in the last 168 hours. No results for input(s): AMMONIA in the last 168 hours. Coagulation Profile: No results for input(s): INR, PROTIME in the last 168 hours. Cardiac Enzymes: No results for input(s): CKTOTAL, CKMB, CKMBINDEX, TROPONINI in the last 168 hours. BNP (last 3 results) No results for input(s): PROBNP in the last 8760 hours. HbA1C: No results for input(s): HGBA1C in the last 72 hours. CBG: Recent Labs  Lab 08/03/21 2105 08/04/21 0759 08/04/21 1132 08/04/21 1621 08/04/21 1949  GLUCAP 308* 211* 230* 256* 252*   Lipid Profile: No results for input(s): CHOL, HDL, LDLCALC, TRIG, CHOLHDL, LDLDIRECT in the last 72 hours. Thyroid Function Tests: No results for input(s): TSH, T4TOTAL, FREET4, T3FREE, THYROIDAB in the last 72 hours. Anemia Panel: No results for input(s): VITAMINB12, FOLATE, FERRITIN, TIBC, IRON,  RETICCTPCT in the last 72 hours. Sepsis Labs: No results for input(s): PROCALCITON, LATICACIDVEN in the last 168 hours.  Recent Results (from the past 240 hour(s))  Resp Panel by RT-PCR (Flu A&B, Covid) Nasopharyngeal Swab     Status: None   Collection Time: 07/26/21  7:19 PM   Specimen: Nasopharyngeal Swab; Nasopharyngeal(NP) swabs in vial transport medium  Result Value Ref Range Status   SARS Coronavirus 2 by RT PCR NEGATIVE NEGATIVE Final    Comment: (NOTE) SARS-CoV-2 target nucleic acids are NOT DETECTED.  The SARS-CoV-2 RNA is generally detectable in upper respiratory specimens during the acute phase of infection. The lowest concentration of SARS-CoV-2 viral copies this assay can detect is 138 copies/mL. A negative result does not preclude SARS-Cov-2 infection and should not be used as the sole basis for treatment or other patient management decisions. A negative result may occur with  improper specimen collection/handling, submission of specimen other than nasopharyngeal swab, presence of viral mutation(s) within the areas targeted by this assay, and inadequate number of viral copies(<138 copies/mL). A negative  result must be combined with clinical observations, patient history, and epidemiological information. The expected result is Negative.  Fact Sheet for Patients:  EntrepreneurPulse.com.au  Fact Sheet for Healthcare Providers:  IncredibleEmployment.be  This test is no t yet approved or cleared by the [place] FDA and  has been authorized for detection and/or diagnosis of SARS-CoV-2 by FDA under an Emergency Use Authorization (EUA). This EUA will remain  in effect (meaning this test can be used) for the duration of the COVID-19 declaration under Section 564(b)(1) of the Act, 21 U.S.C.section 360bbb-3(b)(1), unless the authorization is terminated  or revoked sooner.       Influenza A by PCR NEGATIVE NEGATIVE Final   Influenza  B by PCR NEGATIVE NEGATIVE Final    Comment: (NOTE) The Xpert Xpress SARS-CoV-2/FLU/RSV plus assay is intended as an aid in the diagnosis of influenza from Nasopharyngeal swab specimens and should not be used as a sole basis for treatment. Nasal washings and aspirates are unacceptable for Xpert Xpress SARS-CoV-2/FLU/RSV testing.  Fact Sheet for Patients: EntrepreneurPulse.com.au  Fact Sheet for Healthcare Providers: IncredibleEmployment.be  This test is not yet approved or cleared by the [place] FDA and has been authorized for detection and/or diagnosis of SARS-CoV-2 by FDA under an Emergency Use Authorization (EUA). This EUA will remain in effect (meaning this test can be used) for the duration of the COVID-19 declaration under Section 564(b)(1) of the Act, 21 U.S.C. section 360bbb-3(b)(1), unless the authorization is terminated or revoked.  Performed at [facility], [address] [place], [place] [address]   Body fluid culture w Gram Stain     Status: None   Collection Time: 07/28/21  1:53 PM   Specimen: PATH Cytology Pleural fluid  Result Value Ref Range Status   Specimen Description   Final    PLEURAL Performed at [facility], [address], [place], [place] [address]    Special Requests   Final    NONE Performed at [facility], [address] [place], [place] [address]    Gram Stain   Final    NO SQUAMOUS EPITHELIAL CELLS SEEN FEW WBC SEEN NO ORGANISMS SEEN    Culture   Final    NO GROWTH 3 DAYS Performed at [facility], [place] [address], [place], [place] [address]    Report Status 08/01/2021 FINAL  Final  Body fluid culture w Gram Stain     Status: None (Preliminary result)   Collection Time: 08/01/21  3:36 PM   Specimen: PATH Cytology Peritoneal fluid  Result Value Ref Range Status   Specimen Description   Final    PERITONEAL Performed at [facility], [address], [place], [place] [address]    Special Requests   Final    NONE Performed at [facility], [address] [place], [place] [address]    Gram Stain   Final    RARE WBC PRESENT,BOTH PMN AND MONONUCLEAR NO ORGANISMS SEEN    Culture   Final    NO GROWTH 3 DAYS Performed at [facility], [place] [address] [place], [place] [address]    Report Status PENDING  Incomplete         Radiology Studies: No results found.      Scheduled Meds:  sodium chloride   Intravenous Once   folic acid  1 mg Oral Daily   insulin aspart  0-5 Units Subcutaneous QHS   insulin aspart  0-9 Units Subcutaneous  TID WC   latanoprost  1 drop Both Eyes QHS   metoprolol succinate  25 mg Oral Daily   midodrine  5 mg Oral TID WC   polyethylene glycol  17 g Oral Daily   Ensure Max Protein  11 oz Oral BID   vitamin B-12  500 mcg Oral Daily   Continuous Infusions:  albumin human 12.5 g (08/04/21 1634)   furosemide (LASIX) 200 mg in dextrose 5% 100 mL (2mg /mL) infusion Stopped (08/04/21 1130)   iron sucrose 200 mg (08/04/21 0913)   sodium chloride       LOS: 9 days    Time spent: [place], MD [facility]

## 2021-08-04 NOTE — Consult Note (Addendum)
NAME:  Karen Farley, MRN:  [id number], DOB:  12/27/1945, LOS: 9 ADMISSION DATE:  07/26/2021, CONSULTATION DATE:  08/04/2021 REFERRING MD:  Dr. Damita Dunnings, CHIEF COMPLAINT:  SOB & edema (on admit)   History of Present Illness:  75 year old female presenting to [facility] ED from home on 07/26/2021 with complaints of worsening dyspnea on exertion, orthopnea & 5 pound weight gain over the past 5-7 days. ED course: Per ED documentation patient appeared clinically dehydrated with acute hyponatremia, hypokalemia & elevated BUN.  Chest x-ray showed persistent left-sided pleural effusion. Medications given: She received ceftriaxone azithromycin and gentle IV fluids.  Patient was admitted to hospitalist service. Initial Vitals: Afebrile at 98.2, RR 20, A. fib at 73, hypotensive 84/62 & SPO2 98% on room air Significant labs: (Labs/ Imaging personally reviewed) Chemistry: Na+:, 128 K+: 2.6, BUN/Cr.:  65/1.78, Serum CO2/ AG: 34/15 Hematology: WBC: 8.1, Hgb: 13.6,  Troponin: 17, BNP: 154.2, COVID-19 & Influenza A/B: Negative CXR 07/26/2021: Large left-sided pleural effusion and trace right pleural effusion. cardiomegaly with vascular congestion  Hospital course: Pulmonology consulted for large left pleural effusion status post thoracentesis on 07/28/2021 with removal of 600 mL.  Nephrology consulted to help manage AKI on CKD stage IIIb.  IV Lasix was attempted without sustained success, daily weights continued to increase and patient was transitioned to Lasix drip at 6 mg an hour with albumin twice daily & midodrine. On 08/04/21 patient was noted to have rectal bleeding, GI consulted and Eliquis was discontinued. Plan for colonoscopy/EGD after 48 hours. Overnight patient had additional rectal bleeding episodes with correlating drop in Hgb 2 points, hypotension/tachycardia & lethargy with rapid transfer to ICU needing blood transfusion, STAT CT angiogram and vasopressor administration.  PCCM consulted for admission due  to hemorrhagic shock & vasopressor administration.  Pertinent  Medical History  HFpEF Chronic atrial fibrillation on Eliquis PAD Type 2 diabetes mellitus CKD stage IIIb Lymphedema HTN HLD DDD  Significant Hospital Events: Including procedures, antibiotic start and stop dates in addition to other pertinent events   07/28/2021-pulmonology consulted & thoracentesis removed 600 mL 07/29/2021-nephrology following managing diuretics, AKI on CKD and electrolyte imbalances 08/04/2021-patient had an episode of melena on day shift, GI was consulted & Eliquis discontinued. Overnight patient had additional rectal bleeding with correlating lethargy/ tachycardia/ hypotension. Patient was a rapid transfer to ICU: CTa abdomen/pelvis & blood. Patient remains hypotensive after IVF bolus while waiting for blood transfusion, PCCM consulted for vasopressor management.  Interim History / Subjective:  Patient A&O, pale appearing. She denies any blurred vision or dizziness, but does admit to having blurred vision earlier tonight which has cleared. Husband bedside- all questions and concerns answered at this time. Objective   Blood pressure (!) 67/49, pulse (!) 112, temperature 97.9 F (36.6 C), temperature source Oral, resp. rate (!) 26, height 5\' 2"  (1.575 m), weight 101.7 kg, SpO2 100 %.        Intake/Output Summary (Last 24 hours) at 08/04/2021 2226 Last data filed at 08/04/2021 2114 Gross per 24 hour  Intake 623.55 ml  Output 2300 ml  Net -1676.45 ml   Filed Weights   08/03/21 0507 08/04/21 0445 08/04/21 2020  Weight: 104.2 kg 99.5 kg 101.7 kg    Examination: General: Adult female, critically ill, lying in bed, pale but NAD HEENT: MM pink/dry, anicteric, atraumatic, neck supple Neuro: A&O x 4, able to follow commands, PERRL +3, MAE- generalized weakness CV: s1s2 irregular, A-fib on monitor, no r/m/g Pulm: Regular, non labored on room air , breath sounds  clear/diminished-BUL & diminished/ fine  crackles-BLL GI: soft, obese/edema present, non tender, bs x 4 GU: pure wick in place  Skin: scattered ecchymosis- no rashes/lesions noted Extremities: warm/dry, pulses + 2 R/P, +3 pitting edema noted BLE, +1 in abdomen  Resolved Hospital Problem list     Assessment & Plan:  Acute Gastrointestinal Bleed in the setting of Eliquis Circulatory shock secondary to acute blood loss PMHx: Atrial Fibrillation on Eliquis CT angiogram 08/04/21 > In the rectum on the arterial phase images there is a focal area of contrast extravasation (best seen on image # 225 of series 5 & image # 135, 134 of series 10. Subsequent venous phase images show this to be more diffusely distributed within the rectum consistent with focal GI bleeding. No other area of contrast extravasation is seen. No other pooling of contrast is noted within the colon. Scattered diverticular change is seen without evidence of diverticulitis. Small bowel and stomach appear within normal limits. Discussed findings with Dr. Aleene Davidson with interventional radiology as Dr. Lorenso Courier with vascular surgery on call does not embolize. Dr. Aleene Davidson reviewed imaging and felt that the suspected source was not amenable to embolization. Spoke with GI MD on call Dr. Alice Reichert > see plan below - Eliquis stopped after 9 am dose 10/15 > spoke with Dr. Alice Reichert and decision made to reverse Eliquis anticoagulation in order to safely perform flexible sigmoidoscopy in AM - Rx consulted and Kcentra ordered as reversal agent - Protonix BID  - STAT Type & screen and 2 units of PRBC's ordered by TRH > f/u H&H post - continuous cardiac monitoring - NPO - Monitor for s/s of bleeding - Daily CBC, PT/ INR monitoring PRN - Transfuse for Hgb <7 - GI consulted, appreciate input> plan for flexible sigmoidoscopy in AM - Phenylephrine drip initiated, wean as tolerated to maintain MAP > 65 - continue midodrine TID & albumin IV  Acute on Chronic HFpEF exacerbation Pericardial  Effusion Anasarca Left pleural effusion status post thoracentesis PMHx: HFpEF ECHO 04/2021: LVEF 60 to 65%, no regional wall motion abnormalities, indeterminant diastolic parameters, LA mild to moderately dilated, mild TR, no pericardial effusion  BNP: 154.2 -Lasix drip on hold in the setting of hypotension, consider restarting once patient has stabilized post sigmoidoscopy - Follow-up BNP -Echocardiogram ordered to follow-up on large pericardial effusion seen on CT angiogram overnight -Cardiology consulted, appreciate input - Supplemental oxygen as needed, maintain SpO2 > 90%  Acute Kidney Injury superimposed on CKD stage IIIb in the setting of acute on chronic HFpEF exacerbation & anasarca Hypokalemia-resolved Hyponatremia-resolved Baseline Cr: 1.66, Cr on admission: 1.78 -Lasix drip on hold, continue albumin IV - Strict I/O's: alert provider if UOP < 0.5 mL/kg/hr - gentle IVF hydration: Patient received 1.5 L prior to blood transfusion to help support BP - Daily BMP, replace electrolytes PRN - Avoid nephrotoxic agents as able, ensure adequate renal perfusion - Nephrology following, appreciate input   Chronic Atrial Fibrillation-controlled CHA2DS2-VASc score: 6 Per patient report, she was changed to Eliquis from [place] in July 2022 - Metoprolol discontinued due to ongoing hypotension, restart as BP allows  -Eliquis on hold in the setting of GI bleed, 2000 units of Kcentra administered prior to urgent sigmoidoscopy in a.m. (08/05/2021) - Echocardiogram ordered, see above - Cardiology consulted, appreciate input - Continuous cardiac monitoring  Type 2 Diabetes Mellitus Steroid Induced Hyperglycemia- steroid discontinued 10/14 Hemoglobin A1C: 6.6 - Monitor CBG Q AC, HS - SSI sensitive dosing - target range while in ICU: 140-180 - follow ICU  hyper/hypo-glycemia protocol - Diabetes Coordinator consulted, appreciate input  Iron and vitamin B12 deficiency - Per primary service:  Continue Venofer followed by oral supplementation at discharge  - continue vitamin B12 supplementation daily  Best Practice (right click and "Reselect all SmartList Selections" daily)  Diet/type: NPO DVT prophylaxis: SCD GI prophylaxis: PPI Lines: N/A Foley:  N/A Code Status:  full code Last date of multidisciplinary goals of care discussion [primary service- 08/04/21]  Labs   CBC: Recent Labs  Lab 08/01/21 0434 08/02/21 0432 08/03/21 0454 08/04/21 0504 08/04/21 1233 08/04/21 1845  WBC 11.3* 9.6 8.1 9.0 10.9*  --   NEUTROABS 9.9*  --   --   --   --   --   HGB 13.6 12.9 13.3 12.4 9.7* 7.7*  HCT 41.9 40.2 41.0 38.4 30.0* 24.0*  MCV 75.9* 75.3* 75.1* 74.9* 75.9*  --   PLT 398 337 332 311 288  --     Basic Metabolic Panel: Recent Labs  Lab 07/31/21 0815 08/01/21 0434 08/02/21 0432 08/03/21 0454 08/04/21 0504  NA 128* 128* 131* 135 136  K 4.8 5.1 4.8 4.1 3.8  CL 87* 91* 93* 95* 93*  CO2 28 22 27 29 31   GLUCOSE 205* 227* 253* 280* 214*  BUN 48* 54* 57* 60* 62*  CREATININE 1.85* 1.94* 1.84* 1.75* 1.75*  CALCIUM 9.5 9.3 9.2 9.2 8.9  MG  --   --  2.4 2.7* 2.2  PHOS  --   --  3.5 3.3 2.7   GFR: Estimated Creatinine Clearance: 31 mL/min (A) (by C-G formula based on SCr of 1.75 mg/dL (H)). Recent Labs  Lab 08/02/21 0432 08/03/21 0454 08/04/21 0504 08/04/21 1233  WBC 9.6 8.1 9.0 10.9*    Liver Function Tests: No results for input(s): AST, ALT, ALKPHOS, BILITOT, PROT, ALBUMIN in the last 168 hours. No results for input(s): LIPASE, AMYLASE in the last 168 hours. No results for input(s): AMMONIA in the last 168 hours.  ABG    Component Value Date/Time   HCO3 18.8 (L) 04/11/2021 0210   ACIDBASEDEF 8.7 (H) 04/11/2021 0210   O2SAT 16.3 04/11/2021 0210     Coagulation Profile: No results for input(s): INR, PROTIME in the last 168 hours.  Cardiac Enzymes: No results for input(s): CKTOTAL, CKMB, CKMBINDEX, TROPONINI in the last 168 hours.  HbA1C: Hemoglobin  A1C  Date/Time Value Ref Range Status  06/28/2016 12:00 AM 6.6  Final   HB A1C (BAYER DCA - WAIVED)  Date/Time Value Ref Range Status  07/13/2021 04:37 PM 6.9 (H) 4.8 - 5.6 % Final    Comment:             Prediabetes: 5.7 - 6.4          Diabetes: >6.4          Glycemic control for adults with diabetes: <7.0               **Please note reference interval change**   04/25/2021 11:34 AM 6.3 <7.0 % Final    Comment:                                          Diabetic Adult            <7.0  Healthy Adult        4.3 - 5.7                                                           (DCCT/NGSP) American Diabetes Association's Summary of Glycemic Recommendations for Adults with Diabetes: Hemoglobin A1c <7.0%. More stringent glycemic goals (A1c <6.0%) may further reduce complications at the cost of increased risk of hypoglycemia.    Hgb A1c MFr Bld  Date/Time Value Ref Range Status  05/17/2021 06:30 AM 6.6 (H) 4.8 - 5.6 % Final    Comment:    (NOTE)         Prediabetes: 5.7 - 6.4         Diabetes: >6.4         Glycemic control for adults with diabetes: <7.0     CBG: Recent Labs  Lab 08/03/21 2105 08/04/21 0759 08/04/21 1132 08/04/21 1621 08/04/21 1949  GLUCAP 308* 211* 230* 256* 252*    Review of Systems: positives in BOLD  Gen: Denies fever, chills, weight change, fatigue, night sweats HEENT: Denies blurred vision, double vision, hearing loss, tinnitus, sinus congestion, rhinorrhea, sore throat, neck stiffness, dysphagia PULM: Denies shortness of breath, cough, sputum production, hemoptysis, wheezing CV: Denies chest pain, edema, orthopnea, paroxysmal nocturnal dyspnea, palpitations GI: Denies abdominal pain, nausea, vomiting, diarrhea, maroon colored bowel movements, hematochezia, melena, constipation, change in bowel habits GU: Denies dysuria, hematuria, polyuria, oliguria, urethral discharge Endocrine: Denies hot or cold intolerance,  polyuria, polyphagia or appetite change Derm: Denies rash, dry skin, scaling or peeling skin change Heme: Denies easy bruising, bleeding, bleeding gums Neuro: Denies headache, numbness, weakness, slurred speech, loss of memory or consciousness  Past Medical History:  She,  has a past medical history of Chronic heart failure with preserved ejection fraction (HFpEF) (HCC), 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   Surgical History:   Past Surgical History:  Procedure Laterality Date   CHOLECYSTECTOMY     DILATION AND CURETTAGE OF UTERUS     TEAR DUCT PROBING WITH STRABISMUS REPAIR Right    TONSILLECTOMY       Social History:   reports that she has never smoked. She has never used smokeless tobacco. She reports that she does not currently use alcohol. She reports that she does not use drugs.   Family History:  Her family history includes Breast cancer in her maternal grandmother and mother; Cancer in her brother and brother; Heart disease in her brother and mother; Heart disease (age of onset: 79) in her father; Osteoporosis in her mother; Parkinson's disease in her brother.   Allergies No Known Allergies   Home Medications  Prior to Admission medications   Medication Sig Start Date End Date Taking? Authorizing Provider  ACCU-CHEK GUIDE test strip USE TO CHECK BLOOD SUGAR DAILY 12/18/20  Yes Johnson, [name], DO  apixaban (ELIQUIS) 5 MG TABS tablet Take 1 tablet (5 mg total) by mouth 2 (two) times daily. 04/18/21  Yes Fritzi Mandes, MD  latanoprost (XALATAN) 0.005 % ophthalmic solution Place 1 drop into both eyes at bedtime.   Yes [provider]  linagliptin (TRADJENTA) 5  MG TABS tablet Take 1 tablet (5 mg total) by mouth daily. 07/13/21  Yes  Johnson, [name], DO  metoprolol succinate (TOPROL-XL) 50 MG 24 hr tablet TAKE 1 TABLET(50 MG) BY MOUTH DAILY WITH OR IMMEDIATELY FOLLOWING A MEAL 04/30/21  Yes Johnson, [name], DO  Multiple Vitamin (MULTIVITAMIN WITH MINERALS) TABS tablet Take 1 tablet by mouth daily.   Yes [provider]  torsemide (DEMADEX) 20 MG tablet Take 20 mg by mouth daily.   Yes [provider]  Elastic Bandages & Supports ([place]) [place] by Does not apply route.    Johnson, [name], DO  metolazone (ZAROXOLYN) 2.5 MG tablet Take 1 tablet (2.5 mg total) by mouth daily. 05/27/21 05/27/21  Nolberto Hanlon, MD  spironolactone (ALDACTONE) 25 MG tablet Take 1 tablet (25 mg total) by mouth daily. 05/28/21 05/27/21  Nolberto Hanlon, MD     Critical care time: 65 minutes      Venetia Night, AGACNP-BC Acute Care Nurse Practitioner [facility]   [phone] / [phone] Please see Amion for pager details.

## 2021-08-04 NOTE — Progress Notes (Signed)
Mobility Specialist - Progress Note   08/04/21 1400  Mobility  Activity Contraindicated/medical hold  Mobility performed by Mobility specialist    Per discussion with RN, request to hold mobility this date. Pt with possible GI bleed and loss of consciousness earlier this date. Will attempt session another time as medically appropriate.    Kathee Delton Mobility Specialist 08/04/21, 2:56 PM

## 2021-08-05 ENCOUNTER — Inpatient Hospital Stay (HOSPITAL_COMMUNITY)
Admit: 2021-08-05 | Discharge: 2021-08-05 | Disposition: A | Discharge status: Home / Self Care | Payer: Medicare PPO | Attending: Pulmonary Disease | Admitting: Pulmonary Disease

## 2021-08-05 ENCOUNTER — Encounter
Admission: EM | Disposition: A | Discharge status: Home / Self Care | Payer: Self-pay | Source: Home / Self Care | Attending: Internal Medicine

## 2021-08-05 ENCOUNTER — Inpatient Hospital Stay: Payer: Medicare PPO

## 2021-08-05 DIAGNOSIS — I083 Combined rheumatic disorders of mitral, aortic and tricuspid valves: Secondary | ICD-10-CM | POA: Diagnosis not present

## 2021-08-05 DIAGNOSIS — I4819 Other persistent atrial fibrillation: Secondary | ICD-10-CM | POA: Diagnosis not present

## 2021-08-05 DIAGNOSIS — I3139 Other pericardial effusion (noninflammatory): Secondary | ICD-10-CM

## 2021-08-05 DIAGNOSIS — E1122 Type 2 diabetes mellitus with diabetic chronic kidney disease: Secondary | ICD-10-CM | POA: Diagnosis not present

## 2021-08-05 DIAGNOSIS — I517 Cardiomegaly: Secondary | ICD-10-CM

## 2021-08-05 DIAGNOSIS — I9589 Other hypotension: Secondary | ICD-10-CM | POA: Diagnosis not present

## 2021-08-05 DIAGNOSIS — R579 Shock, unspecified: Secondary | ICD-10-CM

## 2021-08-05 DIAGNOSIS — I5033 Acute on chronic diastolic (congestive) heart failure: Secondary | ICD-10-CM | POA: Diagnosis not present

## 2021-08-05 DIAGNOSIS — D62 Acute posthemorrhagic anemia: Secondary | ICD-10-CM

## 2021-08-05 DIAGNOSIS — J90 Pleural effusion, not elsewhere classified: Secondary | ICD-10-CM | POA: Diagnosis not present

## 2021-08-05 DIAGNOSIS — K922 Gastrointestinal hemorrhage, unspecified: Secondary | ICD-10-CM

## 2021-08-05 DIAGNOSIS — I4821 Permanent atrial fibrillation: Secondary | ICD-10-CM | POA: Diagnosis not present

## 2021-08-05 HISTORY — PX: PERICARDIOCENTESIS: CATH118255

## 2021-08-05 LAB — BODY FLUID CELL COUNT WITH DIFFERENTIAL
Eos, Fluid: 0 %
Lymphs, Fluid: 55 %
Monocyte-Macrophage-Serous Fluid: 30 %
Neutrophil Count, Fluid: 9 %
Other Cells, Fluid: 6 %
Total Nucleated Cell Count, Fluid: 1520 cu mm

## 2021-08-05 LAB — CBC
HCT: 32.8 % — ABNORMAL LOW (ref 36.0–46.0)
Hemoglobin: 11.2 g/dL — ABNORMAL LOW (ref 12.0–15.0)
MCH: 27.4 pg (ref 26.0–34.0)
MCHC: 34.1 g/dL (ref 30.0–36.0)
MCV: 80.2 fL (ref 80.0–100.0)
Platelets: 244 10*3/uL (ref 150–400)
RBC: 4.09 MIL/uL (ref 3.87–5.11)
RDW: 17.2 % — ABNORMAL HIGH (ref 11.5–15.5)
WBC: 13.1 10*3/uL — ABNORMAL HIGH (ref 4.0–10.5)
nRBC: 2.3 % — ABNORMAL HIGH (ref 0.0–0.2)

## 2021-08-05 LAB — HEMOGLOBIN AND HEMATOCRIT, BLOOD
HCT: 29.9 % — ABNORMAL LOW (ref 36.0–46.0)
HCT: 31.4 % — ABNORMAL LOW (ref 36.0–46.0)
HCT: 29.4 % — ABNORMAL LOW (ref 36.0–46.0)
Hemoglobin: 10.3 g/dL — ABNORMAL LOW (ref 12.0–15.0)
Hemoglobin: 10.3 g/dL — ABNORMAL LOW (ref 12.0–15.0)
Hemoglobin: 9.9 g/dL — ABNORMAL LOW (ref 12.0–15.0)

## 2021-08-05 LAB — BODY FLUID CULTURE W GRAM STAIN: Culture: NO GROWTH

## 2021-08-05 LAB — ECHOCARDIOGRAM COMPLETE
AR max vel: 0.93 cm2
AV Area VTI: 0.81 cm2
AV Area mean vel: 0.91 cm2
AV Mean grad: 13 mmHg
AV Peak grad: 26 mmHg
Ao pk vel: 2.55 m/s
Area-P 1/2: 3.83 cm2
Height: 62 in
S' Lateral: 2.12 cm
Weight: 3573.22 oz

## 2021-08-05 LAB — GLUCOSE, CAPILLARY
Glucose-Capillary: 222 mg/dL — ABNORMAL HIGH (ref 70–99)
Glucose-Capillary: 177 mg/dL — ABNORMAL HIGH (ref 70–99)
Glucose-Capillary: 184 mg/dL — ABNORMAL HIGH (ref 70–99)
Glucose-Capillary: 157 mg/dL — ABNORMAL HIGH (ref 70–99)

## 2021-08-05 LAB — BASIC METABOLIC PANEL
Anion gap: 10 (ref 5–15)
BUN: 87 mg/dL — ABNORMAL HIGH (ref 8–23)
CO2: 30 mmol/L (ref 22–32)
Calcium: 8.9 mg/dL (ref 8.9–10.3)
Chloride: 100 mmol/L (ref 98–111)
Creatinine, Ser: 1.55 mg/dL — ABNORMAL HIGH (ref 0.44–1.00)
GFR, Estimated: 35 mL/min — ABNORMAL LOW (ref >60)
Glucose, Bld: 244 mg/dL — ABNORMAL HIGH (ref 70–99)
Potassium: 4 mmol/L (ref 3.5–5.1)
Sodium: 140 mmol/L (ref 135–145)

## 2021-08-05 LAB — PROTIME-INR
INR: 1.4 — ABNORMAL HIGH (ref 0.8–1.2)
Prothrombin Time: 17.2 seconds — ABNORMAL HIGH (ref 11.4–15.2)

## 2021-08-05 LAB — SEDIMENTATION RATE: Sed Rate: 2 mm/hr (ref 0–30)

## 2021-08-05 LAB — C-REACTIVE PROTEIN: CRP: 0.7 mg/dL (ref <1.0)

## 2021-08-05 LAB — PHOSPHORUS: Phosphorus: 3.7 mg/dL (ref 2.5–4.6)

## 2021-08-05 LAB — MAGNESIUM: Magnesium: 2.1 mg/dL (ref 1.7–2.4)

## 2021-08-05 LAB — BRAIN NATRIURETIC PEPTIDE: B Natriuretic Peptide: 184.5 pg/mL — ABNORMAL HIGH (ref 0.0–100.0)

## 2021-08-05 SURGERY — PERICARDIOCENTESIS

## 2021-08-05 MED ORDER — PANTOPRAZOLE SODIUM 40 MG IV SOLR
40.0000 mg | Freq: Two times a day (BID) | INTRAVENOUS | Status: DC
  Administered 2021-08-05 – 2021-08-26 (×42): 40 mg via INTRAVENOUS
  Filled 2021-08-05 (×42): qty 40

## 2021-08-05 MED ORDER — LIDOCAINE HCL (PF) 1 % IJ SOLN
INTRAMUSCULAR | Status: DC | PRN
  Administered 2021-08-05: 10 mL

## 2021-08-05 MED ORDER — HEPARIN (PORCINE) IN NACL 1000-0.9 UT/500ML-% IV SOLN
INTRAVENOUS | Status: DC | PRN
  Administered 2021-08-05: 500 mL

## 2021-08-05 MED ORDER — POLYETHYLENE GLYCOL 3350 17 G PO PACK
17.0000 g | PACK | Freq: Every day | ORAL | Status: DC | PRN
  Administered 2021-08-09: 17 g via ORAL
  Filled 2021-08-05: qty 1

## 2021-08-05 MED ORDER — PROTHROMBIN COMPLEX CONC HUMAN 500 UNITS IV KIT
2127.0000 [IU] | PACK | Status: AC
  Administered 2021-08-05: 2127 [IU] via INTRAVENOUS
  Filled 2021-08-05: qty 2127

## 2021-08-05 MED ORDER — LIDOCAINE HCL 1 % IJ SOLN
INTRAMUSCULAR | Status: AC
  Filled 2021-08-05: qty 20

## 2021-08-05 MED ORDER — LIDOCAINE HCL (PF) 2 % IJ SOLN
0.0000 mL | Freq: Once | INTRAMUSCULAR | Status: DC | PRN
  Filled 2021-08-05: qty 20

## 2021-08-05 SURGICAL SUPPLY — 5 items
COVER PROBE U/S 5X48 (MISCELLANEOUS) ×2 IMPLANT
DRAPE BRACHIAL (DRAPES) ×2 IMPLANT
KIT SYRINGE INJ CVI SPIKEX1 (MISCELLANEOUS) ×2 IMPLANT
PACK CARDIAC CATH (CUSTOM PROCEDURE TRAY) ×2 IMPLANT
TRAY PERICARDIOCENTESIS 6FX60 (TRAY / TRAY PROCEDURE) ×2 IMPLANT

## 2021-08-05 NOTE — Plan of Care (Signed)
Discussed with patient plan of care for the evening, pain management and blood pressure with some teach back displayed.  Problem: Education: Goal: Knowledge of General Education information will improve Description: Including pain rating scale, medication(s)/side effects and non-pharmacologic comfort measures Outcome: Progressing   Problem: Health Behavior/Discharge Planning: Goal: Ability to manage health-related needs will improve Outcome: Progressing

## 2021-08-05 NOTE — Consult Note (Addendum)
Cardiology Consultation:   Patient ID: Karen Farley MRN: [id number]; DOB: 04-14-1946  Admit date: 07/26/2021 Date of Consult: 08/05/2021  PCP:  Valerie Roys, DO   [facility] Providers Cardiologist:  Kate Sable, MD        Patient Profile:   Karen Farley is a 75 y.o. female with a hx of CKD stage IIIa, diastolic heart failure, hypertension, permanent atrial fibrillation, hyperlipidemia who is being seen 08/05/2021 for the evaluation of atrial fibrillation, pericardial effusion at the request of [facility].  History of Present Illness:   Karen Farley is a 75 year old woman with the above medical history.  She presented to the hospital with worsening dyspnea on exertion, orthopnea, and a 5 pound weight gain.  While in the hospital, she was noted to have a large left-sided pleural effusion and is status postthoracentesis 07/28/2021 with removal of 600 mL of fluid.  She was started on IV fluids, though did not diurese well.  She was then started on a Lasix drip by nephrology.  She does have stage IIIb CKD.  On 08/04/2021 she was noted to have a rectal bleed.  GI was consulted and Eliquis was discontinued.  Unfortunately, she continued to have bleeding and a hemoglobin drop of 2 points associated with hypotension and tachycardia.  She was transferred to the ICU.  She had a CT angiogram performed, which showed a focal area extra of extravasation.  She has plans for flexible sigmoidoscopy.  She had a CT scan that showed a large pericardial effusion.  Transthoracic echo is currently pending.   Past Medical History:  Diagnosis Date   Chronic heart failure with preserved ejection fraction (HFpEF) ([place])    a. 11/2019 Echo: EF 60-65%, no rwma, Nl RV size/fxn. Mod dil LA.   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 Authorizing Provider  ACCU-CHEK GUIDE test strip USE TO CHECK BLOOD SUGAR DAILY 12/18/20  Yes Johnson, [name], DO  apixaban (ELIQUIS) 5 MG TABS tablet Take 1 tablet (5 mg total) by mouth 2 (two) times daily. 04/18/21  Yes Fritzi Mandes, MD  latanoprost (XALATAN) 0.005 % ophthalmic solution Place 1 drop into both eyes at bedtime.   Yes [provider]  linagliptin (TRADJENTA) 5 MG TABS tablet Take 1 tablet (5 mg total) by mouth daily. 07/13/21  Yes Johnson, [name], DO  metoprolol succinate (TOPROL-XL) 50 MG 24 hr tablet TAKE 1 TABLET(50 MG) BY MOUTH DAILY WITH OR IMMEDIATELY FOLLOWING A MEAL 04/30/21  Yes Johnson, [name], DO  Multiple Vitamin (MULTIVITAMIN WITH MINERALS) TABS tablet Take 1 tablet by mouth daily.   Yes [provider]  torsemide (DEMADEX) 20 MG tablet Take 20 mg by mouth daily.   Yes [provider]  Elastic Bandages & Supports ([place]) Trail by  Does not apply route.    Johnson, [name], DO  metolazone (ZAROXOLYN) 2.5 MG tablet Take 1 tablet (2.5 mg total) by mouth daily. 05/27/21 05/27/21  Nolberto Hanlon, MD  spironolactone (ALDACTONE) 25 MG tablet Take 1 tablet (25 mg total) by mouth daily. 05/28/21 05/27/21  Nolberto Hanlon, MD    Inpatient Medications: Scheduled Meds:  Chlorhexidine Gluconate Cloth  6 each Topical Daily   folic acid  1 mg Oral Daily   insulin aspart  0-5 Units Subcutaneous QHS   insulin aspart  0-9 Units Subcutaneous TID WC   latanoprost  1 drop Both Eyes QHS   midodrine  5 mg Oral TID WC   pantoprazole (PROTONIX)  IV  40 mg Intravenous Q12H   Ensure Max Protein  11 oz Oral BID   vitamin B-12  500 mcg Oral Daily   Continuous Infusions:  sodium chloride Stopped (08/05/21 1607)   albumin human Stopped (08/05/21 0751)   iron sucrose 200 mg (08/05/21 1241)   phenylephrine (NEO-SYNEPHRINE) Adult infusion 35 mcg/min (08/05/21 1242)   PRN Meds: acetaminophen, lip balm, polyethylene glycol  Allergies:   No Known Allergies  Social History:   Social History   Socioeconomic History   Marital status: Married    Spouse name: Not on file   Number of children: Not on file   Years of education: Not on file   Highest education level: Not on file  Occupational History   Not on file  Tobacco Use   Smoking status: Never   Smokeless tobacco: Never  Vaping Use   Vaping Use: Never used  Substance and Sexual Activity   Alcohol use: Not Currently    Alcohol/week: 0.0 standard drinks    Comment: on rare occasion   Drug use: No   Sexual activity: Not Currently  Other Topics Concern   Not on file  Social History Narrative   Not on file   Social Determinants of Health   Financial Resource Strain: Low Risk    Difficulty of Paying Living Expenses: Not hard at all  Food Insecurity: No Food Insecurity   Worried About Charity fundraiser in the Last Year: Never true   Arboriculturist in the Last Year: Never true  Transportation Needs: No Transportation Needs   Lack of Transportation (Medical): No   Lack of Transportation (Non-Medical): No  Physical Activity: Sufficiently Active   Days of Exercise per Week: 7 days   Minutes of Exercise per Session: 30 min  Stress: No Stress Concern Present   Feeling of Stress : Not at all  Social Connections: Moderately Integrated   Frequency of Communication with Friends and Family: More than three times a week   Frequency of Social Gatherings with Friends and Family: Twice a week   Attends Religious Services: 1 to 4 times per year   Active Member of [employer] or  Organizations: No   Attends Archivist Meetings: Never   Marital Status: Married  Human resources officer Violence: Not At Risk   Fear of Current or Ex-Partner: No   Emotionally Abused: No   Physically Abused: No   Sexually Abused: No    Family History:    Family History  Problem Relation Age of Onset   Heart disease Mother        CHF   Osteoporosis Mother    Breast cancer Mother    Heart disease Father 70   Cancer Brother        Colon CA- 2002-  Youngest brother   Parkinson's disease Brother        Younger Brother   Heart disease Brother        2 brothers   Cancer Brother    Breast cancer Maternal Grandmother      ROS:  Please see the history of present illness.   All other ROS reviewed and negative.     Physical Exam/Data:   Vitals:   08/05/21 1100 08/05/21 1154 08/05/21 1155 08/05/21 1200  BP: 106/68  97/68 96/70  Pulse: 94   97  Resp: 16   (!) 27  Temp:  (!) 97.5 F (36.4 C)    TempSrc:  Oral    SpO2: 97%   96%  Weight:      Height:        Intake/Output Summary (Last 24 hours) at 08/05/2021 1312 Last data filed at 08/05/2021 1100 Gross per 24 hour  Intake 3002.74 ml  Output 300 ml  Net 2702.74 ml   Last 3 Weights 08/05/2021 08/04/2021 08/04/2021  Weight (lbs) 223 lb 5.2 oz 224 lb 3.3 oz 219 lb 4.8 oz  Weight (kg) 101.3 kg 101.7 kg 99.474 kg     Body mass index is 40.85 kg/m.  General:  Well nourished, well developed, in no acute distress HEENT: normal Neck: no JVD Vascular: No carotid bruits; Distal pulses 2+ bilaterally Cardiac:  normal S1, S2; RRR; no murmur  Lungs:  clear to auscultation bilaterally, no wheezing, rhonchi or rales  Abd: soft, nontender, no hepatomegaly  Ext: no edema Musculoskeletal:  No deformities, BUE and BLE strength normal and equal Skin: warm and dry  Neuro:  CNs 2-12 intact, no focal abnormalities noted Psych:  Normal affect   EKG:  The EKG was personally reviewed and demonstrates:  AF Telemetry:  Telemetry was  personally reviewed and demonstrates:  AF  Relevant CV Studies: TTE 08/05/21 Left ventricular ejection fraction, by estimation, is 60 to 65%. The left ventricle has normal function. The left ventricle has no regional wall motion abnormalities. There is mild left ventricular hypertrophy. Left ventricular diastolic parameters are indeterminate. 1. Right ventricular systolic function is normal. The right ventricular size is normal. There is moderately elevated pulmonary artery systolic pressure. The estimated right ventricular systolic pressure is 74.0 mmHg. 2. 3. Left atrial size was mildly dilated. 4. The mitral valve is normal in structure. Mild mitral valve regurgitation. The aortic valve was not well visualized,Calcification noted. Aortic valve regurgitation is mild. Mild to moderate aortic valve sclerosis/calcification is present, unable to exclude mild aortic valve regurgitation. 5. The inferior vena cava is dilated in size with <50% respiratory variability, suggesting right atrial pressure of 15 mmHg. 6. A large pericardial effusion is present. 2.74 to 3.39 cm circumferential, no significant RA or RV collapse. Mitral and tricuspid valve in-flow velocities not measured. Grossly, there does not appear to be tamponade. Of concern is the dilated IVC with little respiratory variation and moderately elevated right heart pressures.  Laboratory Data:  High Sensitivity Troponin:   Recent Labs  Lab 07/26/21 1708  TROPONINIHS 17     Chemistry Recent Labs  Lab 08/03/21 0454 08/04/21 0504 08/05/21 0444  NA 135 136 140  K 4.1 3.8 4.0  CL 95* 93* 100  CO2 29 31 30   GLUCOSE 280* 214* 244*  BUN 60* 62* 87*  CREATININE 1.75* 1.75* 1.55*  CALCIUM 9.2 8.9 8.9  MG 2.7* 2.2 2.1  GFRNONAA 30* 30* 35*  ANIONGAP 11 12 10     No  results for input(s): PROT, ALBUMIN, AST, ALT, ALKPHOS, BILITOT in the last 168 hours. Lipids No results for input(s): CHOL, TRIG, HDL, LABVLDL, LDLCALC,  CHOLHDL in the last 168 hours.  Hematology Recent Labs  Lab 08/04/21 0504 08/04/21 1233 08/04/21 1845 08/05/21 0444 08/05/21 1106  WBC 9.0 10.9*  --  13.1*  --   RBC 5.13* 3.95  --  4.09  --   HGB 12.4 9.7* 7.7* 11.2* 10.3*  HCT 38.4 30.0* 24.0* 32.8* 29.9*  MCV 74.9* 75.9*  --  80.2  --   MCH 24.2* 24.6*  --  27.4  --   MCHC 32.3 32.3  --  34.1  --   RDW 18.6* 18.3*  --  17.2*  --   PLT 311 288  --  244  --    Thyroid  Recent Labs  Lab 08/01/21 0434  TSH 0.902    BNP Recent Labs  Lab 08/05/21 0444  BNP 184.5*    DDimer No results for input(s): DDIMER in the last 168 hours.   Radiology/Studies:  US Paracentesis  Result Date: 08/01/2021 INDICATION: Ascites request received for diagnostic and therapeutic paracentesis. EXAM: ULTRASOUND GUIDED THERAPEUTIC PARACENTESIS MEDICATIONS: Local 1% lidocaine only. COMPLICATIONS: None immediate. PROCEDURE: Informed written consent was obtained from the patient after a discussion of the risks, benefits and alternatives to treatment. A timeout was performed prior to the initiation of the procedure. Initial ultrasound scanning demonstrates a small amount of ascites within the left lower abdominal quadrant. The left lower abdomen was prepped and draped in the usual sterile fashion. 1% lidocaine was used for local anesthesia. Following this, a 19 gauge, 10-cm, Yueh catheter was introduced. An ultrasound image was saved for documentation purposes. The paracentesis was performed. The catheter was removed and a dressing was applied. The patient tolerated the procedure well without immediate post procedural complication. FINDINGS: A total of approximately 1.2 L of amber colored slightly cloudy fluid was removed. Samples were sent to the laboratory as requested by the clinical team. IMPRESSION: Successful ultrasound-guided paracentesis yielding 1.2 liters of peritoneal fluid. Read By: Tsosie Billing PA-C Electronically Signed   By: Jerilynn Mages.  Shick M.D.   On:  08/01/2021 15:59   CT ANGIO GI BLEED  Result Date: 08/04/2021 CLINICAL DATA:  Painless bloody stools with down trending hemoglobin, initial encounter EXAM: CTA ABDOMEN AND PELVIS WITHOUT AND WITH CONTRAST TECHNIQUE: Multidetector CT imaging of the abdomen and pelvis was performed using the standard protocol during bolus administration of intravenous contrast. Multiplanar reconstructed images and MIPs were obtained and reviewed to evaluate the vascular anatomy. CONTRAST:  22mL OMNIPAQUE IOHEXOL 350 MG/ML SOLN COMPARISON:  None. FINDINGS: VASCULAR Aorta: Atherosclerotic calcifications of the abdominal aorta are noted. No aneurysmal dilatation or dissection is seen. The aorta is somewhat diminutive Celiac: Atherosclerotic calcifications are noted at the origin with approximately 50% stenosis and mild poststenotic dilatation in the celiac. SMA: Atherosclerotic calcifications are noted with high-grade stenosis although no significant poststenotic dilatation is seen. Renals: Single renal arteries are identified bilaterally. High-grade stenosis is noted in the origin of the renal arteries bilaterally with somewhat diminutive for renal artery branches likely related to the hypotensive state. IMA: Patent without evidence of aneurysm, dissection, vasculitis or significant stenosis. Inflow: Iliacs demonstrate atherosclerotic calcification without focal high-grade stenosis. Veins: No specific venous abnormality is noted. Review of the MIP images confirms the above findings. NON-VASCULAR Lower chest: Lung bases demonstrate evidence of left-sided pleural effusion with left lower lobe consolidation. Large pericardial effusion is noted Hepatobiliary: Liver  is fatty infiltrated. Some mild nodularity is noted suggestive of underlying cirrhosis. Mild perihepatic ascites is seen. The gallbladder has been surgically removed. Pancreas: Unremarkable. No pancreatic ductal dilatation or surrounding inflammatory changes. Spleen: Normal  in size without focal abnormality. Adrenals/Urinary Tract: Adrenal glands appear within normal limits. Kidneys demonstrate a normal enhancement pattern bilaterally. No obstructive changes are seen. The bladder is partially distended. Stomach/Bowel: In the rectum on the arterial phase images there is a focal area of contrast extravasation identified best seen on image number 225 of series 5 and image number 135 and 134 of series 10. Subsequent venous phase images show this to be more diffusely distributed within the rectum consistent with focal GI bleeding. No other area of contrast extravasation is seen. No other pooling of contrast is noted within the colon. Scattered diverticular change is seen without evidence of diverticulitis. Small bowel and stomach appear within normal limits. Lymphatic: No significant lymphadenopathy is noted. Reproductive: Uterus demonstrates multiple calcified uterine fibroids. No definitive adnexal mass is seen. Other: Mild ascites is noted surrounding the liver and spleen. Mild changes of anasarca are noted Musculoskeletal: Degenerative changes of lumbar spine and hip joints are noted. No acute bony abnormality is seen. IMPRESSION: VASCULAR Changes consistent with active GI hemorrhage within the distal rectum as described. No other focal area of acute hemorrhage is seen. 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 Mild ascites is noted as well. Left-sided pleural effusion with lower lobe consolidation. Large pericardial effusion. Diverticulosis without diverticulitis. Uterine fibroid change. Aortic Atherosclerosis (ICD10-I70.0). Critical Value/emergent results were called by telephone at the time of interpretation on 08/04/2021 at 11:27 pm to [facility], NP , who verbally acknowledged these results. Electronically Signed   By: Inez Catalina M.D.    On: 08/04/2021 23:31   US Abdomen Limited RUQ (LIVER/GB)  Result Date: 08/01/2021 CLINICAL DATA:  75 year old female with history of cirrhosis. EXAM: ULTRASOUND ABDOMEN LIMITED RIGHT UPPER QUADRANT COMPARISON:  No prior. FINDINGS: Gallbladder: Status post cholecystectomy. Common bile duct: Diameter: 4 mm Liver: No focal lesion identified. Within normal limits in parenchymal echogenicity. Portal vein is patent on color Doppler imaging with normal direction of blood flow towards the liver. Other: None. IMPRESSION: 1. No acute findings. 2. Status post cholecystectomy. Electronically Signed   By: Vinnie Langton M.D.   On: 08/01/2021 18:07     Assessment and Plan:   Permanent atrial fibrillation: Patient was previously on Eliquis.  This has been held due to GI bleeding.  Rates appear to be well controlled.  Continue with current management. Pericardial effusion: Found to be large on CT scan.  Echo performed today shows no obvious pericardial tamponade, but IVC is 2 cm and not changing with respiration. It is certainly possible that her volume overload is masking tamponade. Additionally, she has required increasing doses of phenylephrine. Due to that combination of issues, emergent pericardiocentesis is warranted. Denine Brotz call in the cath lab team and discuss with on call physician.   Acute GI bleeding: Eliquis has been stopped.  She has plans for sigmoidoscopy with gastroenterology.  Continue phenylephrine to support blood pressure. Acute on chronic diastolic heart failure: Echo this admission with a normal ejection fraction.  As she is having hemorrhagic shock, would hold off on diuresis for now.  Once bleeding has been stabilized, would restart Lasix drip.   For questions or updates, please contact [place] Please consult [email or web address] for contact  info under    Signed, Sandy Haye Meredith Leeds, MD  08/05/2021 1:12 PM  CRITICAL CARE Performed by: Ilija Maxim Martin Hassell Patras   Total critical care  time: 50 minutes  Critical care time was exclusive of separately billable procedures and treating other patients.  Critical care was necessary to treat or prevent imminent or life-threatening deterioration.  Critical care was time spent personally by me on the following activities: development of treatment plan with patient and/or surrogate as well as nursing, discussions with consultants, evaluation of patient's response to treatment, examination of patient, obtaining history from patient or surrogate, ordering and performing treatments and interventions, ordering and review of laboratory studies, ordering and review of radiographic studies, pulse oximetry and re-evaluation of patient's condition.

## 2021-08-05 NOTE — Plan of Care (Signed)
  Problem: Education: Goal: Knowledge of General Education information will improve Description: Including pain rating scale, medication(s)/side effects and non-pharmacologic comfort measures Outcome: Progressing   Problem: Pain Managment: Goal: General experience of comfort will improve Outcome: Progressing   Problem: Health Behavior/Discharge Planning: Goal: Ability to manage health-related needs will improve Outcome: Not Progressing   Problem: Clinical Measurements: Goal: Ability to maintain clinical measurements within normal limits will improve Outcome: Not Progressing Goal: Will remain free from infection Outcome: Not Progressing Goal: Diagnostic test results will improve Outcome: Not Progressing Goal: Respiratory complications will improve Outcome: Not Progressing Goal: Cardiovascular complication will be avoided Outcome: Not Progressing   Problem: Activity: Goal: Risk for activity intolerance will decrease Outcome: Not Progressing   Problem: Nutrition: Goal: Adequate nutrition will be maintained Outcome: Not Progressing   Problem: Elimination: Goal: Will not experience complications related to bowel motility Outcome: Not Progressing   Problem: Safety: Goal: Ability to remain free from injury will improve Outcome: Not Progressing   Problem: Skin Integrity: Goal: Risk for impaired skin integrity will decrease Outcome: Not Progressing   Problem: Education: Goal: Ability to demonstrate management of disease process will improve Outcome: Not Progressing Goal: Ability to verbalize understanding of medication therapies will improve Outcome: Not Progressing Goal: Individualized Educational Video(s) Outcome: Not Progressing   Problem: Activity: Goal: Capacity to carry out activities will improve Outcome: Not Progressing   Problem: Cardiac: Goal: Ability to achieve and maintain adequate cardiopulmonary perfusion will improve Outcome: Not Progressing Patient  in ICU requiring neo to maintain BP on bedrest and NPO at this time

## 2021-08-05 NOTE — Consult Note (Signed)
NAME:  Karen Farley, MRN:  [id number], DOB:  10/17/1946, LOS: 90 ADMISSION DATE:  07/26/2021, CONSULTATION DATE:  08/04/2021 REFERRING MD:  Dr. Damita Dunnings, CHIEF COMPLAINT:  SOB & edema (on admit)   History of Present Illness:  75 year old female presenting to [facility] ED from home on 07/26/2021 with complaints of worsening dyspnea on exertion, orthopnea & 5 pound weight gain over the past 5-7 days. ED course: Per ED documentation patient appeared clinically dehydrated with acute hyponatremia, hypokalemia & elevated BUN.  Chest x-ray showed persistent left-sided pleural effusion. Medications given: She received ceftriaxone azithromycin and gentle IV fluids.  Patient was admitted to hospitalist service. Initial Vitals: Afebrile at 98.2, RR 20, A. fib at 73, hypotensive 84/62 & SPO2 98% on room air Significant labs: (Labs/ Imaging personally reviewed) Chemistry: Na+:, 128 K+: 2.6, BUN/Cr.:  65/1.78, Serum CO2/ AG: 34/15 Hematology: WBC: 8.1, Hgb: 13.6,  Troponin: 17, BNP: 154.2, COVID-19 & Influenza A/B: Negative CXR 07/26/2021: Large left-sided pleural effusion and trace right pleural effusion. cardiomegaly with vascular congestion  Hospital course: Pulmonology consulted for large left pleural effusion status post thoracentesis on 07/28/2021 with removal of 600 mL.  Nephrology consulted to help manage AKI on CKD stage IIIb.  IV Lasix was attempted without sustained success, daily weights continued to increase and patient was transitioned to Lasix drip at 6 mg an hour with albumin twice daily & midodrine. On 08/04/21 patient was noted to have rectal bleeding, GI consulted and Eliquis was discontinued. Plan for colonoscopy/EGD after 48 hours. Overnight patient had additional rectal bleeding episodes with correlating drop in Hgb 2 points, hypotension/tachycardia & lethargy with rapid transfer to ICU needing blood transfusion, STAT CT angiogram and vasopressor administration.  PCCM consulted for admission  due to hemorrhagic shock & vasopressor administration.  CT angiogram 08/04/21 > In the rectum on the arterial phase images there is a focal area of contrast extravasation (best seen on image # 225 of series 5 & image # 135, 134 of series 10. Subsequent venous phase images show this to be more diffusely distributed within the rectum consistent with focal GI bleeding. No other area of contrast extravasation is seen. No other pooling of contrast is noted within the colon. Scattered diverticular change is seen without evidence of diverticulitis. Small bowel and stomach appear within normal limits. Pertinent  Medical History  HFpEF Chronic atrial fibrillation on Eliquis PAD Type 2 diabetes mellitus CKD stage IIIb Lymphedema HTN HLD DDD  Significant Hospital Events: Including procedures, antibiotic start and stop dates in addition to other pertinent events   07/28/2021-pulmonology consulted & thoracentesis removed 600 mL 07/29/2021-nephrology following managing diuretics, AKI on CKD and electrolyte imbalances 08/04/2021-patient had an episode of melena on day shift, GI was consulted & Eliquis discontinued. Overnight patient had additional rectal bleeding with correlating lethargy/ tachycardia/ hypotension. Patient was a rapid transfer to ICU: CTa abdomen/pelvis & blood. Patient remains hypotensive after IVF bolus while waiting for blood transfusion, PCCM consulted for vasopressor management. 08/05/2021-echocardiogram confirmed large pericardial effusion patient taken to Cath Lab for pericardiocentesis removing 700 mL of bloody drainage, returning to ICU with drain in place.  Patient remains on vasopressor support, " a little" rectal blood per nursing during the day, flexible sigmoidoscopy on hold due to pericardiocentesis.  Hemoglobin remained stable.  Interim History / Subjective:  Patient alert and oriented, does admit to being tired after the long evening and day.  She denies any current  complaints: No chest pain/shortness of breath/nausea/abdominal pain/dizziness/blurred vision. She reports that overall  she feels the same after the pericardiocentesis as she did prior to the intervention.  No additional drainage noted from pericardial drain  Objective   Blood pressure 92/67, pulse 82, temperature (!) 97.5 F (36.4 C), temperature source Oral, resp. rate 20, height 5\' 2"  (1.575 m), weight 101.3 kg, SpO2 100 %.        Intake/Output Summary (Last 24 hours) at 08/05/2021 1933 Last data filed at 08/05/2021 1900 Gross per 24 hour  Intake 3593.04 ml  Output 700 ml  Net 2893.04 ml   Filed Weights   08/04/21 0445 08/04/21 2020 08/05/21 0413  Weight: 99.5 kg 101.7 kg 101.3 kg    Examination: General: Adult female, acutely ill, lying in bed, NAD HEENT: MM pink/moist, anicteric, atraumatic, neck supple Neuro: A&O x 4, able to follow commands, PERRL +3, MAE-generalized weakness CV: s1s2 irregular, controlled atrial fibrillation on monitor, no r/m/g Pulm: Regular, non labored on 2 L nasal cannula, breath sounds clear-BUL & diminished-BLL GI: soft, rounded, non tender, bs x 4 GU: pure wick in place  with clear amber/yellow urine Skin: Scattered ecchymosis, BLE wrapped due to lymphedema and blisters(per patient), pericardial drain noted with gauze dressing clean dry and intact Extremities: warm/dry, pulses + 2 R/P, +3 edema noted BLE  Resolved Hospital Problem list     Assessment & Plan:  Suspected lower Acute Gastrointestinal Bleed in the setting of Eliquis Circulatory shock secondary to acute blood loss PMHx: Atrial Fibrillation on Eliquis Last major episode of rectal bleeding 08/04/2021 around 22:00-Per nursing " a little" rectal bleeding on day shift.  Hemoglobin has remained stable s/p 2 units PRBCs.  Flexible sigmoidoscopy deferred on 08/05/2021 due to large pericardial effusion requiring pericardiocentesis. - Eliquis stopped after 9 am dose 10/15, remains on  hold -Protonix twice daily -Maintain active type & screen, serial H&H - NPO -Monitor for s/s of bleeding -Daily CBC, PT/INR monitoring PRN -Transfuse for Hgb <7 -GI consulted, appreciate input -Continue midodrine 3 times daily and albumin IV - continue Phenylephrine drip, wean as tolerated to maintain MAP > 65  Acute on Chronic HFpEF exacerbation Pericardial Effusion s/p pericardiocentesis and drain placement (08/05/21) Anasarca PMHx: HFpEF ECHO 04/2021: LVEF 60 to 65%, , no pericardial effusion  CTa 08/04/21: Revealed large pericardial effusion Echo 08/05/2021: LVEF 60 to 65%, IVC dilated, large pericardial effusion BNP: 154.2 > 184.5 Patient went urgently to cardiac Cath Lab for pericardiocentesis removing 700 mL of bloody drainage, returning to ICU with pericardial drain in place. -Monitor pericardial drain output -Lasix drip on hold, consider restarting once patient has stabilized and is off vasopressor support -Cardiology consulted, appreciate input -Continue phenylephrine drip, see above -Supplemental oxygen as needed to maintain SPO2 > 90%  Left pleural effusion status post thoracentesis (07/28/21) CXR 08/05/2021: Pericardial catheter in place, (read as) enlarging left-sided effusion > when looking at CXR post thora however, pleural effusion appears unchanged -Supplemental oxygen PRN, to maintain SPO2 > 90% -Intermittent CXR & ABG PRN - Patient appears stable from a respiratory standpoint, consider additional intervention PRN  Acute Kidney Injury superimposed on CKD stage IIIb in the setting of acute on chronic HFpEF exacerbation & anasarca-resolved Hypokalemia-resolved Hyponatremia-resolved Baseline Cr: 1.66, Cr on admission: 1.78 Creatinine has returned to baseline -Continue fluid restriction once patient's diet has resumed -Strict I's and O's -Lasix drip on hold, see above, continue albumin IV -Daily BMP, replace electrolytes as needed -Avoid nephrotoxic agents as  able, ensure adequate renal perfusion -Continue phenylephrine drip as above -Nephrology following, appreciate input  Chronic  Atrial Fibrillation-controlled CHA2DS2-VASc score: 6 Per patient report, she was changed to Eliquis from Xarelto in July 2022 -Outpatient metoprolol discontinued in the setting of ongoing vasopressor requirement, restart as BP allows -Eliquis remains on hold due to GI bleed -Continuous cardiac monitoring -Cardiology following, appreciate input  Type 2 Diabetes Mellitus Steroid Induced Hyperglycemia- steroid discontinued 10/14 Hemoglobin A1C: 6.6 -Monitor CBG Q ACHS -SSI sensitive dosing -Target range while in ICU: 140-180 -Follow ICU hyper/hypoglycemia protocol -Diabetes coordinator consulted appreciate input  Iron and vitamin B12 deficiency -Per primary service: Continue Venofer followed by oral supplementation at discharge -Continue vitamin B12 supplementation daily  Best Practice (right click and "Reselect all SmartList Selections" daily)  Diet/type: NPO DVT prophylaxis: SCD GI prophylaxis: PPI Lines: N/A Foley:  N/A Code Status:  full code Last date of multidisciplinary goals of care discussion [primary service- 08/04/21]  Labs   CBC: Recent Labs  Lab 08/01/21 0434 08/02/21 0432 08/03/21 0454 08/04/21 0504 08/04/21 1233 08/04/21 1845 08/05/21 0444 08/05/21 1106 08/05/21 1706  WBC 11.3* 9.6 8.1 9.0 10.9*  --  13.1*  --   --   NEUTROABS 9.9*  --   --   --   --   --   --   --   --   HGB 13.6 12.9 13.3 12.4 9.7* 7.7* 11.2* 10.3* 10.3*  HCT 41.9 40.2 41.0 38.4 30.0* 24.0* 32.8* 29.9* 31.4*  MCV 75.9* 75.3* 75.1* 74.9* 75.9*  --  80.2  --   --   PLT 398 337 332 311 288  --  244  --   --     Basic Metabolic Panel: Recent Labs  Lab 08/01/21 0434 08/02/21 0432 08/03/21 0454 08/04/21 0504 08/05/21 0444  NA 128* 131* 135 136 140  K 5.1 4.8 4.1 3.8 4.0  CL 91* 93* 95* 93* 100  CO2 22 27 29 31 30   GLUCOSE 227* 253* 280* 214* 244*  BUN  54* 57* 60* 62* 87*  CREATININE 1.94* 1.84* 1.75* 1.75* 1.55*  CALCIUM 9.3 9.2 9.2 8.9 8.9  MG  --  2.4 2.7* 2.2 2.1  PHOS  --  3.5 3.3 2.7 3.7   GFR: Estimated Creatinine Clearance: 35 mL/min (A) (by C-G formula based on SCr of 1.55 mg/dL (H)). Recent Labs  Lab 08/03/21 0454 08/04/21 0504 08/04/21 1233 08/05/21 0444  WBC 8.1 9.0 10.9* 13.1*    Liver Function Tests: No results for input(s): AST, ALT, ALKPHOS, BILITOT, PROT, ALBUMIN in the last 168 hours. No results for input(s): LIPASE, AMYLASE in the last 168 hours. No results for input(s): AMMONIA in the last 168 hours.  ABG    Component Value Date/Time   HCO3 18.8 (L) 04/11/2021 0210   ACIDBASEDEF 8.7 (H) 04/11/2021 0210   O2SAT 16.3 04/11/2021 0210     Coagulation Profile: Recent Labs  Lab 08/05/21 0444  INR 1.4*    Cardiac Enzymes: No results for input(s): CKTOTAL, CKMB, CKMBINDEX, TROPONINI in the last 168 hours.  HbA1C: Hemoglobin A1C  Date/Time Value Ref Range Status  06/28/2016 12:00 AM 6.6  Final   HB A1C (BAYER DCA - WAIVED)  Date/Time Value Ref Range Status  07/13/2021 04:37 PM 6.9 (H) 4.8 - 5.6 % Final    Comment:             Prediabetes: 5.7 - 6.4          Diabetes: >6.4          Glycemic control for adults with diabetes: <7.0               **  Please note reference interval change**   04/25/2021 11:34 AM 6.3 <7.0 % Final    Comment:                                          Diabetic Adult            <7.0                                       Healthy Adult        4.3 - 5.7                                                           (DCCT/NGSP) American Diabetes Association's Summary of Glycemic Recommendations for Adults with Diabetes: Hemoglobin A1c <7.0%. More stringent glycemic goals (A1c <6.0%) may further reduce complications at the cost of increased risk of hypoglycemia.    Hgb A1c MFr Bld  Date/Time Value Ref Range Status  05/17/2021 06:30 AM 6.6 (H) 4.8 - 5.6 % Final    Comment:     (NOTE)         Prediabetes: 5.7 - 6.4         Diabetes: >6.4         Glycemic control for adults with diabetes: <7.0     CBG: Recent Labs  Lab 08/04/21 1949 08/04/21 2245 08/05/21 0728 08/05/21 1159 08/05/21 1559  GLUCAP 252* 212* 222* 177* 184*      Critical care time: 42 minutes      Venetia Night, AGACNP-BC Acute Care Nurse Practitioner [facility]   [phone] / [phone] Please see Amion for pager details.

## 2021-08-05 NOTE — Progress Notes (Signed)
[facility] Gastroenterology Inpatient Progress Note  Subjective: Patient seen for follow up of presumed LGI bleeding. Patient had a large "dark brown" liquid bm yesterday with concurrent drop in Hgb to 7.7. This prompted a call from the hospitalist, Dr. Damita Dunnings, to request further GI recommendations. I recommended a stat CTA to assess bleeding site, which revealed pooling of contrast in the 'distal rectum'. Patient had no BRBPR and no further bleeding episodes. Patient was placed on IV pressors which are currently being weaned. Patient underwent further blood transfusion with current Hgb being at an improved level of  11.2 this morning. Patient is currently in NAD, is conversive, alert and oriented x 3. Patient's husband is also at bedside. They are awaiting cardiologist to assess for patient's heart failure to provide further recommendations on fluid overload to help the "puffiness". From a GI standpoint, patient denies abdominal pain, nausea, vomiting, hemetemesis, hematochezia or melena overnight.   Objective: Vital signs in last 24 hours: Temp:  [97.7 F (36.5 C)-98.6 F (37 C)] 98.3 F (36.8 C) (10/16 0752) Pulse Rate:  [80-127] 80 (10/16 1030) Resp:  [16-34] 19 (10/16 1030) BP: (62-142)/(37-121) 98/62 (10/16 1030) SpO2:  [93 %-100 %] 97 % (10/16 1030) Weight:  [101.3 kg-101.7 kg] 101.3 kg (10/16 0413) Blood pressure 98/62, pulse 80, temperature 98.3 F (36.8 C), temperature source Oral, resp. rate 19, height 5\' 2"  (1.575 m), weight 101.3 kg, SpO2 97 %.    Intake/Output from previous day: 10/15 0701 - 10/16 0700 In: 2871.6 [I.V.:409; Blood:800; IV Piggyback:1662.6] Out: 1200 [Urine:1200]  Intake/Output this shift: Total I/O In: 112.4 [I.V.:51.9; IV Piggyback:60.5] Out: -    Gen: NAD. Appears comfortable.  HEENT: /AT. PERRLA. Normal external ear exam.  Chest: CTA, no wheezes.  CV: Irreg, tachy, nl S1, S2. No gallops.  Abd: soft, nt, moderately distended. BS+  Ext:  3+ pitting edema. Pulses 2+  Neuro: Alert and oriented. Judgement appears normal. Nonfocal.   Lab Results: Results for orders placed or performed during the hospital encounter of 07/26/21 (from the past 24 hour(s))  Glucose, capillary     Status: Abnormal   Collection Time: 08/04/21 11:32 AM  Result Value Ref Range   Glucose-Capillary 230 (H) 70 - 99 mg/dL  CBC     Status: Abnormal   Collection Time: 08/04/21 12:33 PM  Result Value Ref Range   WBC 10.9 (H) 4.0 - 10.5 K/uL   RBC 3.95 3.87 - 5.11 MIL/uL   Hemoglobin 9.7 (L) 12.0 - 15.0 g/dL   HCT 30.0 (L) 36.0 - 46.0 %   MCV 75.9 (L) 80.0 - 100.0 fL   MCH 24.6 (L) 26.0 - 34.0 pg   MCHC 32.3 30.0 - 36.0 g/dL   RDW 18.3 (H) 11.5 - 15.5 %   Platelets 288 150 - 400 K/uL   nRBC 0.4 (H) 0.0 - 0.2 %  ABO/Rh     Status: None   Collection Time: 08/04/21 12:33 PM  Result Value Ref Range   ABO/RH(D)      A POS Performed at [facility], [address] [place], [place] [address]   Glucose, capillary     Status: Abnormal   Collection Time: 08/04/21  4:21 PM  Result Value Ref Range   Glucose-Capillary 256 (H) 70 - 99 mg/dL  Hemoglobin and hematocrit, blood     Status: Abnormal   Collection Time: 08/04/21  6:45 PM  Result Value Ref Range   Hemoglobin 7.7 (L) 12.0 - 15.0 g/dL   HCT 24.0 (  L) 36.0 - 46.0 %  Prepare RBC (crossmatch)     Status: None   Collection Time: 08/04/21  7:46 PM  Result Value Ref Range   Order Confirmation      ORDER PROCESSED BY BLOOD BANK Performed at [facility], [address] [place], [place] [address]   Glucose, capillary     Status: Abnormal   Collection Time: 08/04/21  7:49 PM  Result Value Ref Range   Glucose-Capillary 252 (H) 70 - 99 mg/dL  Type and screen Diller     Status: None (Preliminary result)   Collection Time: 08/04/21  8:38 PM  Result Value Ref Range   ABO/RH(D) A POS    Antibody Screen NEG    Sample Expiration 08/07/2021,2359    Unit  Number [id number]    Blood Component Type RED CELLS,LR    Unit division 00    Status of Unit ISSUED    Transfusion Status OK TO TRANSFUSE    Crossmatch Result Compatible    Unit Number [id number]    Blood Component Type RED CELLS,LR    Unit division 00    Status of Unit ISSUED    Transfusion Status OK TO TRANSFUSE    Crossmatch Result      Compatible Performed at [facility], [address] [place], [place] [address]   MRSA Next Gen by PCR, Nasal     Status: None   Collection Time: 08/04/21  8:56 PM   Specimen: Nasal Mucosa; Nasal Swab  Result Value Ref Range   MRSA by PCR Next Gen NOT DETECTED NOT DETECTED  Glucose, capillary     Status: Abnormal   Collection Time: 08/04/21 10:45 PM  Result Value Ref Range   Glucose-Capillary 212 (H) 70 - 99 mg/dL  Basic metabolic panel     Status: Abnormal   Collection Time: 08/05/21  4:44 AM  Result Value Ref Range   Sodium 140 135 - 145 mmol/L   Potassium 4.0 3.5 - 5.1 mmol/L   Chloride 100 98 - 111 mmol/L   CO2 30 22 - 32 mmol/L   Glucose, Bld 244 (H) 70 - 99 mg/dL   BUN 87 (H) 8 - 23 mg/dL   Creatinine, Ser 1.55 (H) 0.44 - 1.00 mg/dL   Calcium 8.9 8.9 - 10.3 mg/dL   GFR, Estimated 35 (L) >60 mL/min   Anion gap 10 5 - 15  Brain natriuretic peptide     Status: Abnormal   Collection Time: 08/05/21  4:44 AM  Result Value Ref Range   B Natriuretic Peptide 184.5 (H) 0.0 - 100.0 pg/mL  CBC     Status: Abnormal   Collection Time: 08/05/21  4:44 AM  Result Value Ref Range   WBC 13.1 (H) 4.0 - 10.5 K/uL   RBC 4.09 3.87 - 5.11 MIL/uL   Hemoglobin 11.2 (L) 12.0 - 15.0 g/dL   HCT 32.8 (L) 36.0 - 46.0 %   MCV 80.2 80.0 - 100.0 fL   MCH 27.4 26.0 - 34.0 pg   MCHC 34.1 30.0 - 36.0 g/dL   RDW 17.2 (H) 11.5 - 15.5 %   Platelets 244 150 - 400 K/uL   nRBC 2.3 (H) 0.0 - 0.2 %  Protime-INR     Status: Abnormal   Collection Time: 08/05/21  4:44 AM  Result Value Ref Range   Prothrombin Time 17.2 (H) 11.4 - 15.2 seconds   INR  1.4 (H) 0.8 - 1.2  Magnesium  Status: None   Collection Time: 08/05/21  4:44 AM  Result Value Ref Range   Magnesium 2.1 1.7 - 2.4 mg/dL  Phosphorus     Status: None   Collection Time: 08/05/21  4:44 AM  Result Value Ref Range   Phosphorus 3.7 2.5 - 4.6 mg/dL  Glucose, capillary     Status: Abnormal   Collection Time: 08/05/21  7:28 AM  Result Value Ref Range   Glucose-Capillary 222 (H) 70 - 99 mg/dL     Recent Labs    08/04/21 0504 08/04/21 1233 08/04/21 1845 08/05/21 0444  WBC 9.0 10.9*  --  13.1*  HGB 12.4 9.7* 7.7* 11.2*  HCT 38.4 30.0* 24.0* 32.8*  PLT 311 288  --  244   BMET Recent Labs    08/03/21 0454 08/04/21 0504 08/05/21 0444  NA 135 136 140  K 4.1 3.8 4.0  CL 95* 93* 100  CO2 29 31 30   GLUCOSE 280* 214* 244*  BUN 60* 62* 87*  CREATININE 1.75* 1.75* 1.55*  CALCIUM 9.2 8.9 8.9   LFT No results for input(s): PROT, ALBUMIN, AST, ALT, ALKPHOS, BILITOT, BILIDIR, IBILI in the last 72 hours. PT/INR Recent Labs    08/05/21 0444  LABPROT 17.2*  INR 1.4*   Hepatitis Panel No results for input(s): HEPBSAG, HCVAB, HEPAIGM, HEPBIGM in the last 72 hours. C-Diff No results for input(s): CDIFFTOX in the last 72 hours. No results for input(s): CDIFFPCR in the last 72 hours.   Studies/Results: CT ANGIO GI BLEED  Result Date: 08/04/2021 CLINICAL DATA:  Painless bloody stools with down trending hemoglobin, initial encounter EXAM: CTA ABDOMEN AND PELVIS WITHOUT AND WITH CONTRAST TECHNIQUE: Multidetector CT imaging of the abdomen and pelvis was performed using the standard protocol during bolus administration of intravenous contrast. Multiplanar reconstructed images and MIPs were obtained and reviewed to evaluate the vascular anatomy. CONTRAST:  36mL OMNIPAQUE IOHEXOL 350 MG/ML SOLN COMPARISON:  None. FINDINGS: VASCULAR Aorta: Atherosclerotic calcifications of the abdominal aorta are noted. No aneurysmal dilatation or dissection is seen. The aorta is somewhat  diminutive Celiac: Atherosclerotic calcifications are noted at the origin with approximately 50% stenosis and mild poststenotic dilatation in the celiac. SMA: Atherosclerotic calcifications are noted with high-grade stenosis although no significant poststenotic dilatation is seen. Renals: Single renal arteries are identified bilaterally. High-grade stenosis is noted in the origin of the renal arteries bilaterally with somewhat diminutive for renal artery branches likely related to the hypotensive state. IMA: Patent without evidence of aneurysm, dissection, vasculitis or significant stenosis. Inflow: Iliacs demonstrate atherosclerotic calcification without focal high-grade stenosis. Veins: No specific venous abnormality is noted. Review of the MIP images confirms the above findings. NON-VASCULAR Lower chest: Lung bases demonstrate evidence of left-sided pleural effusion with left lower lobe consolidation. Large pericardial effusion is noted Hepatobiliary: Liver is fatty infiltrated. Some mild nodularity is noted suggestive of underlying cirrhosis. Mild perihepatic ascites is seen. The gallbladder has been surgically removed. Pancreas: Unremarkable. No pancreatic ductal dilatation or surrounding inflammatory changes. Spleen: Normal in size without focal abnormality. Adrenals/Urinary Tract: Adrenal glands appear within normal limits. Kidneys demonstrate a normal enhancement pattern bilaterally. No obstructive changes are seen. The bladder is partially distended. Stomach/Bowel: In the rectum on the arterial phase images there is a focal area of contrast extravasation identified best seen on image number 225 of series 5 and image number 135 and 134 of series 10. Subsequent venous phase images show this to be more diffusely distributed within the rectum consistent with focal GI  bleeding. No other area of contrast extravasation is seen. No other pooling of contrast is noted within the colon. Scattered diverticular change  is seen without evidence of diverticulitis. Small bowel and stomach appear within normal limits. Lymphatic: No significant lymphadenopathy is noted. Reproductive: Uterus demonstrates multiple calcified uterine fibroids. No definitive adnexal mass is seen. Other: Mild ascites is noted surrounding the liver and spleen. Mild changes of anasarca are noted Musculoskeletal: Degenerative changes of lumbar spine and hip joints are noted. No acute bony abnormality is seen. IMPRESSION: VASCULAR Changes consistent with active GI hemorrhage within the distal rectum as described. No other focal area of acute hemorrhage is seen. 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 Mild ascites is noted as well. Left-sided pleural effusion with lower lobe consolidation. Large pericardial effusion. Diverticulosis without diverticulitis. Uterine fibroid change. Aortic Atherosclerosis (ICD10-I70.0). Critical Value/emergent results were called by telephone at the time of interpretation on 08/04/2021 at 11:27 pm to [facility], NP , who verbally acknowledged these results. Electronically Signed   By: Inez Catalina M.D.   On: 08/04/2021 23:31    Scheduled Inpatient Medications:    Chlorhexidine Gluconate Cloth  6 each Topical Daily   folic acid  1 mg Oral Daily   insulin aspart  0-5 Units Subcutaneous QHS   insulin aspart  0-9 Units Subcutaneous TID WC   latanoprost  1 drop Both Eyes QHS   midodrine  5 mg Oral TID WC   pantoprazole (PROTONIX) IV  40 mg Intravenous Q12H   Ensure Max Protein  11 oz Oral BID   vitamin B-12  500 mcg Oral Daily    Continuous Inpatient Infusions:    sodium chloride Stopped (08/05/21 3810)   albumin human Stopped (08/05/21 0751)   iron sucrose Stopped (08/04/21 0930)   phenylephrine (NEO-SYNEPHRINE) Adult infusion 25 mcg/min (08/05/21 1000)    PRN Inpatient  Medications:  acetaminophen, lip balm, polyethylene glycol  Miscellaneous: N/A  Assessment:  GI bleeding - Presumed lower. Currently stable after transfusion, Eliquis held. HFpEF - Persistent. Cardiology consult pending. Patient's regular cardiologist is Dr. Garen Lah. Hypotensive shock - On pressors, improving, being weaned. Possible pericardial effusion - Discussed with the Pulmonary/CC intensivist, Dr. Patsey Berthold. Cardiology consult ordered.  Plan:  Continue to monitor blood loss closely, transfuse as needed. I discussed at length with the patient and her husband my recommendation that cardiology address the cardiovascular issues and optimize them prior to endoluminal evaluation, if that can be done. Will continue to follow.  Firman Petrow K. Alice Reichert, M.D. 08/05/2021, 10:40 AM

## 2021-08-05 NOTE — Progress Notes (Signed)
(  1945)  On arrival to patient room patient BP 64/47, patient alert/diaphoretic with Large maroon colored stool in bed. Care nurse started new IV and IV bolus started, Dr. Damita Dunnings notified on the way to bedside with plans to transfer to ICU SD.

## 2021-08-05 NOTE — Progress Notes (Signed)
[facility]  ROUNDING NOTE   Subjective:   [name] was admitted to [facility] on 07/26/2021 for Morbid obesity ([place]) [E66.01] DOE (dyspnea on exertion) [R06.09] Recurrent left pleural effusion [J90] Hypotension [I95.9] Type 2 diabetes mellitus without complication, without long-term current use of insulin ([place]) [E11.9] Atrial fibrillation, unspecified type ([place]) [I48.91] Chronic congestive heart failure, unspecified heart failure type [facility]) [I50.9]  Patient was last seen by my partner, Dr. Candiss Norse, on 05/31/21. Where torsemide was restarted and to continue metolazone.   Patient admitted with shortness of breath and increasing peripheral edema. She was then started on IV furosemide. Left thoracentesis done on 07/28/21  622mL of pleural fluid.   Patient transferred to ICU unit last night for symptomatic GI bleed and hypotension.  Received 2 units of blood overnight. Currently appears resting comfortably with husband at bedside Currently on neo for blood pressure support Remained on room air and overall feels improved  Objective:  Vital signs in last 24 hours:  Temp:  [97.5 F (36.4 C)-98.6 F (37 C)] 97.5 F (36.4 C) (10/16 1154) Pulse Rate:  [80-127] 97 (10/16 1200) Resp:  [16-34] 27 (10/16 1200) BP: (62-142)/(37-121) 96/70 (10/16 1200) SpO2:  [93 %-100 %] 96 % (10/16 1200) Weight:  [101.3 kg-101.7 kg] 101.3 kg (10/16 0413)  Weight change: 2.226 kg Filed Weights   08/04/21 0445 08/04/21 2020 08/05/21 0413  Weight: 99.5 kg 101.7 kg 101.3 kg    Intake/Output: I/O last 3 completed shifts: In: 2879.7 [I.V.:417.1; Blood:800; IV Piggyback:1662.6] Out: 3100 [Urine:3100]   Intake/Output this shift:  Total I/O In: 131.1 [I.V.:70.6; IV Piggyback:60.5] Out: -   Physical Exam: General: NAD, resting in bed  Head: Normocephalic, atraumatic. Moist oral mucosal membranes  Eyes: Anicteric  Lungs:  Clear bilaterally, normal effort  Heart: Regular rate and rhythm   Abdomen:  Soft, nontender  Extremities:  ++ peripheral edema.  Neurologic: Nonfocal, moving all four extremities  Skin: Bilateral lower extremity erythema and fluid filled blisters, bandaged        Basic Metabolic Panel: Recent Labs  Lab 08/01/21 0434 08/02/21 0432 08/03/21 0454 08/04/21 0504 08/05/21 0444  NA 128* 131* 135 136 140  K 5.1 4.8 4.1 3.8 4.0  CL 91* 93* 95* 93* 100  CO2 22 27 29 31 30   GLUCOSE 227* 253* 280* 214* 244*  BUN 54* 57* 60* 62* 87*  CREATININE 1.94* 1.84* 1.75* 1.75* 1.55*  CALCIUM 9.3 9.2 9.2 8.9 8.9  MG  --  2.4 2.7* 2.2 2.1  PHOS  --  3.5 3.3 2.7 3.7     Liver Function Tests: No results for input(s): AST, ALT, ALKPHOS, BILITOT, PROT, ALBUMIN in the last 168 hours.  No results for input(s): LIPASE, AMYLASE in the last 168 hours. No results for input(s): AMMONIA in the last 168 hours.  CBC: Recent Labs  Lab 08/01/21 0434 08/02/21 0432 08/03/21 0454 08/04/21 0504 08/04/21 1233 08/04/21 1845 08/05/21 0444 08/05/21 1106  WBC 11.3* 9.6 8.1 9.0 10.9*  --  13.1*  --   NEUTROABS 9.9*  --   --   --   --   --   --   --   HGB 13.6 12.9 13.3 12.4 9.7* 7.7* 11.2* 10.3*  HCT 41.9 40.2 41.0 38.4 30.0* 24.0* 32.8* 29.9*  MCV 75.9* 75.3* 75.1* 74.9* 75.9*  --  80.2  --   PLT 398 337 332 311 288  --  244  --      Cardiac Enzymes: No results  for input(s): CKTOTAL, CKMB, CKMBINDEX, TROPONINI in the last 168 hours.  BNP: Invalid input(s): POCBNP  CBG: Recent Labs  Lab 08/04/21 1621 08/04/21 1949 08/04/21 2245 08/05/21 0728 08/05/21 1159  GLUCAP 256* 252* 212* 222* 177*     Microbiology: Results for orders placed or performed during the hospital encounter of 07/26/21  Resp Panel by RT-PCR (Flu A&B, Covid) Nasopharyngeal Swab     Status: None   Collection Time: 07/26/21  7:19 PM   Specimen: Nasopharyngeal Swab; Nasopharyngeal(NP) swabs in vial transport medium  Result Value Ref Range Status   SARS Coronavirus 2 by RT PCR NEGATIVE  NEGATIVE Final    Comment: (NOTE) SARS-CoV-2 target nucleic acids are NOT DETECTED.  The SARS-CoV-2 RNA is generally detectable in upper respiratory specimens during the acute phase of infection. The lowest concentration of SARS-CoV-2 viral copies this assay can detect is 138 copies/mL. A negative result does not preclude SARS-Cov-2 infection and should not be used as the sole basis for treatment or other patient management decisions. A negative result may occur with  improper specimen collection/handling, submission of specimen other than nasopharyngeal swab, presence of viral mutation(s) within the areas targeted by this assay, and inadequate number of viral copies(<138 copies/mL). A negative result must be combined with clinical observations, patient history, and epidemiological information. The expected result is Negative.  Fact Sheet for Patients:  EntrepreneurPulse.com.au  Fact Sheet for Healthcare Providers:  IncredibleEmployment.be  This test is no t yet approved or cleared by the [place] FDA and  has been authorized for detection and/or diagnosis of SARS-CoV-2 by FDA under an Emergency Use Authorization (EUA). This EUA will remain  in effect (meaning this test can be used) for the duration of the COVID-19 declaration under Section 564(b)(1) of the Act, 21 U.S.C.section 360bbb-3(b)(1), unless the authorization is terminated  or revoked sooner.       Influenza A by PCR NEGATIVE NEGATIVE Final   Influenza B by PCR NEGATIVE NEGATIVE Final    Comment: (NOTE) The Xpert Xpress SARS-CoV-2/FLU/RSV plus assay is intended as an aid in the diagnosis of influenza from Nasopharyngeal swab specimens and should not be used as a sole basis for treatment. Nasal washings and aspirates are unacceptable for Xpert Xpress SARS-CoV-2/FLU/RSV testing.  Fact Sheet for Patients: EntrepreneurPulse.com.au  Fact Sheet for Healthcare  Providers: IncredibleEmployment.be  This test is not yet approved or cleared by the [place] FDA and has been authorized for detection and/or diagnosis of SARS-CoV-2 by FDA under an Emergency Use Authorization (EUA). This EUA will remain in effect (meaning this test can be used) for the duration of the COVID-19 declaration under Section 564(b)(1) of the Act, 21 U.S.C. section 360bbb-3(b)(1), unless the authorization is terminated or revoked.  Performed at [facility], [address] [place], [place] [address]   Body fluid culture w Gram Stain     Status: None   Collection Time: 07/28/21  1:53 PM   Specimen: PATH Cytology Pleural fluid  Result Value Ref Range Status   Specimen Description   Final    PLEURAL Performed at [facility], [address], [place], [place] [address]    Special Requests   Final    NONE Performed at [facility], [address] [place], [place] [address]    Gram Stain   Final    NO SQUAMOUS EPITHELIAL CELLS SEEN FEW WBC SEEN NO ORGANISMS SEEN    Culture   Final    NO GROWTH 3 DAYS Performed  at [facility], [place] [address], [place], [place] [address]    Report Status 08/01/2021 FINAL  Final  Body fluid culture w Gram Stain     Status: None   Collection Time: 08/01/21  3:36 PM   Specimen: PATH Cytology Peritoneal fluid  Result Value Ref Range Status   Specimen Description   Final    PERITONEAL Performed at [facility], [address], [place], [place] [address]    Special Requests   Final    NONE Performed at [facility], [address] [place], [place] [address]    Gram Stain   Final    RARE WBC PRESENT,BOTH PMN AND MONONUCLEAR NO ORGANISMS SEEN    Culture   Final    NO GROWTH 3 DAYS Performed at [facility], [place] [address], [place], [place] [address]    Report Status 08/05/2021 FINAL  Final  MRSA Next Gen by PCR, Nasal     Status: None    Collection Time: 08/04/21  8:56 PM   Specimen: Nasal Mucosa; Nasal Swab  Result Value Ref Range Status   MRSA by PCR Next Gen NOT DETECTED NOT DETECTED Final    Comment: (NOTE) The GeneXpert MRSA Assay (FDA approved for NASAL specimens only), is one component of a comprehensive MRSA colonization surveillance program. It is not intended to diagnose MRSA infection nor to guide or monitor treatment for MRSA infections. Test performance is not FDA approved in patients less than 22 years old. Performed at [facility], [address] [place], [place] [address]     Coagulation Studies: Recent Labs    08/05/21 0444  LABPROT 17.2*  INR 1.4*    Urinalysis: No results for input(s): COLORURINE, LABSPEC, PHURINE, GLUCOSEU, HGBUR, BILIRUBINUR, KETONESUR, PROTEINUR, UROBILINOGEN, NITRITE, LEUKOCYTESUR in the last 72 hours.  Invalid input(s): APPERANCEUR    Imaging: CT ANGIO GI BLEED  Result Date: 08/04/2021 CLINICAL DATA:  Painless bloody stools with down trending hemoglobin, initial encounter EXAM: CTA ABDOMEN AND PELVIS WITHOUT AND WITH CONTRAST TECHNIQUE: Multidetector CT imaging of the abdomen and pelvis was performed using the standard protocol during bolus administration of intravenous contrast. Multiplanar reconstructed images and MIPs were obtained and reviewed to evaluate the vascular anatomy. CONTRAST:  73mL OMNIPAQUE IOHEXOL 350 MG/ML SOLN COMPARISON:  None. FINDINGS: VASCULAR Aorta: Atherosclerotic calcifications of the abdominal aorta are noted. No aneurysmal dilatation or dissection is seen. The aorta is somewhat diminutive Celiac: Atherosclerotic calcifications are noted at the origin with approximately 50% stenosis and mild poststenotic dilatation in the celiac. SMA: Atherosclerotic calcifications are noted with high-grade stenosis although no significant poststenotic dilatation is seen. Renals: Single renal arteries are identified bilaterally. High-grade stenosis is  noted in the origin of the renal arteries bilaterally with somewhat diminutive for renal artery branches likely related to the hypotensive state. IMA: Patent without evidence of aneurysm, dissection, vasculitis or significant stenosis. Inflow: Iliacs demonstrate atherosclerotic calcification without focal high-grade stenosis. Veins: No specific venous abnormality is noted. Review of the MIP images confirms the above findings. NON-VASCULAR Lower chest: Lung bases demonstrate evidence of left-sided pleural effusion with left lower lobe consolidation. Large pericardial effusion is noted Hepatobiliary: Liver is fatty infiltrated. Some mild nodularity is noted suggestive of underlying cirrhosis. Mild perihepatic ascites is seen. The gallbladder has been surgically removed. Pancreas: Unremarkable. No pancreatic ductal dilatation or surrounding inflammatory changes. Spleen: Normal in size without focal abnormality. Adrenals/Urinary Tract: Adrenal glands appear within normal limits. Kidneys demonstrate a normal enhancement  pattern bilaterally. No obstructive changes are seen. The bladder is partially distended. Stomach/Bowel: In the rectum on the arterial phase images there is a focal area of contrast extravasation identified best seen on image number 225 of series 5 and image number 135 and 134 of series 10. Subsequent venous phase images show this to be more diffusely distributed within the rectum consistent with focal GI bleeding. No other area of contrast extravasation is seen. No other pooling of contrast is noted within the colon. Scattered diverticular change is seen without evidence of diverticulitis. Small bowel and stomach appear within normal limits. Lymphatic: No significant lymphadenopathy is noted. Reproductive: Uterus demonstrates multiple calcified uterine fibroids. No definitive adnexal mass is seen. Other: Mild ascites is noted surrounding the liver and spleen. Mild changes of anasarca are noted  Musculoskeletal: Degenerative changes of lumbar spine and hip joints are noted. No acute bony abnormality is seen. IMPRESSION: VASCULAR Changes consistent with active GI hemorrhage within the distal rectum as described. No other focal area of acute hemorrhage is seen. 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 Mild ascites is noted as well. Left-sided pleural effusion with lower lobe consolidation. Large pericardial effusion. Diverticulosis without diverticulitis. Uterine fibroid change. Aortic Atherosclerosis (ICD10-I70.0). Critical Value/emergent results were called by telephone at the time of interpretation on 08/04/2021 at 11:27 pm to [facility], NP , who verbally acknowledged these results. Electronically Signed   By: Inez Catalina M.D.   On: 08/04/2021 23:31     Medications:    sodium chloride Stopped (08/05/21 7782)   albumin human Stopped (08/05/21 0751)   iron sucrose 200 mg (08/05/21 1241)   phenylephrine (NEO-SYNEPHRINE) Adult infusion 35 mcg/min (08/05/21 1242)    Chlorhexidine Gluconate Cloth  6 each Topical Daily   folic acid  1 mg Oral Daily   insulin aspart  0-5 Units Subcutaneous QHS   insulin aspart  0-9 Units Subcutaneous TID WC   latanoprost  1 drop Both Eyes QHS   midodrine  5 mg Oral TID WC   pantoprazole (PROTONIX) IV  40 mg Intravenous Q12H   Ensure Max Protein  11 oz Oral BID   vitamin B-12  500 mcg Oral Daily     Assessment/ Plan:  [name] is a 75 y.o. white female with diastolic congestive heart failure, hypertension, hyperlipidemia, lymphedema, atrial fibrillation, peripheral vascular disease who is admitted to [facility] on 07/26/2021 for Morbid obesity ([place]) [E66.01] DOE (dyspnea on exertion) [R06.09] Recurrent left pleural effusion [J90] Hypotension [I95.9] Type 2 diabetes mellitus without complication, without  long-term current use of insulin ([facility]) [E11.9] Atrial fibrillation, unspecified type ([place]) [I48.91] Chronic congestive heart failure, unspecified heart failure type ([facility]) [I50.9]  Acute kidney injury on chronic kidney disease stage IIIB: with baseline creatinine of 1.66, GFR of 36 on 07/13/21. History of bland urine. 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 Hold benazepril and spironolactone.  Attempted IV lasix without sustained success.Daily weights indicate steady increase in weight daily. Transitioned to Lasix drip at 6mg /hr.with Albumin twice daily.  -Lasix remains on hold due to recent acute GI bleed and hypotension.  Continue albumin twice daily  -Creatinine returned to baseline. -We will continue to monitor and determine need for diuretics  Hypotension: BP soft at 96/70 currently on neo for BP support  Acute exacerbation of diastolic congestive heart failure:  - IV Furosemide held -Restriction remains in place  Hyponatremia: with hypervolumia.   -  Resolved with furosemide drip, will monitor - Continue fluid restriction.   5. Left pleural effusion: Thoracentesis on 07/28/21 with 674ml removed. Paracentesis on 08/01/21 with 1.2L removed. Solucortef ordered. Pulmonology following   LOS: [place] 10/16/202212:53 PM

## 2021-08-05 NOTE — Progress Notes (Signed)
*  PRELIMINARY RESULTS* Echocardiogram 2D Echocardiogram has been performed.  Karen Farley 08/05/2021, 2:26 PM

## 2021-08-05 NOTE — Progress Notes (Signed)
PROGRESS NOTE    Karen Farley  AOZ:[id number] DOB: [birth year] DOA: 07/26/2021 PCP: Valerie Roys, DO   Brief Narrative:  Karen Farley is a 75 y.o. female with medical history significant for HFpEF, permanent atrial fibrillation on Eliquis, PAD, type 2 diabetes, CKD stage IIIb, lymphedema who presents with concerns of increasing shortness of breath and edema.   Patient notes that since her last hospitalization in August she has been having increasing abdominal distention.  Had increasing lower extremity edema on top of her feet for the past 2 weeks.  She also notes worsening dyspnea on exertion for the past several weeks and had chest x-ray with PCP showing recurrence of left-sided pleural effusion.  She was in the process of being referred to pulmonology for thoracentesis but could not wait for the appointment due to worsening symptoms and decided to present to the ED.   Patient last admitted on 7/28-8/7 with Acute on chronic diastolic CHF, left-sided pleural effusion and acute on chronic CKD.  Both cardiology and nephrology was consulted and she was treated with IV diuretic. Had thoracentesis on 8/1 with 400 cc removed likely transudative fluid based on limited lab work  she had metolazone, torsemide and Aldactone held at discharge. She had follow-up with nephrology on 8/11 and had restarted torsemide 20 mg 3 times per week and metolazone 2.5 mg once a week.  Later switched Torsemide every other day since she had discussion with nephrology that it would help make it easier for  her. States she still has good urine output with changes in regimen.  However she has been monitoring her weight daily and has noted a 5lb  weight gain from 218 to 223lb since August. Last dose of Torsemide yesterday.    ED Course: She was afebrile, hypotensive down to 84/62 initially which improved with 500 cc normal saline fluid.  Sodium of 128, K of 2.6, chloride of 79, creatinine of 1.78 around her  baseline, BG 195.  Magnesium of 2.2.  BNP of 154.  Normal AST and ALT.  T bili mildly elevated at 2.6. Chest x-ray showing large left-sided pleural effusion.   Pulmonology consulted for effusion. Patient has gone for thoracentesis with removal of 600 cc    10/16 Overnight patient had a complicated course.  Developed GI bleed, hemorrhagic shock, was transferred to the ICU/pressors, seen by cardiology, nephrology, gastroenterology and critical care, with emergent pericardiocentesis initiated with plan to: Cath Lab for procedure today   Assessment & Plan:   Principal Problem:   Acute blood loss anemia Active Problems:   Type 2 diabetes mellitus with renal complication (HCC)   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 diabetes mellitus (HCC)   Atrial fibrillation (HCC)   Pleural effusion   Acute on chronic diastolic CHF (congestive heart failure) (HCC)   Chronic anticoagulation   Shock circulatory (HCC)   Pericardial effusion   Acute on chronic diastolic CHF Continue diuresis as tolerated Lasix drip held secondary to transfer to ICU with GI bleed and acute hemorrhagic shock Allergy following closely involved in patient's care Reinitiate Lasix once hemodynamically stable   GI bleed Patient was noted to be mildly hypotensive and dizzy Saturday afternoon Had 1 stool that was notable for blood Discussed with GI who will saw in consultation Stat CBC ordered with a significant drop to 9.7 from 12.4 Repeat hemoglobin on every 6 scheduled showed a decrease to 7.7, patient became hypotensive consistent with hemorrhagic shock and was transferred to the ICU Anticoagulation  discontinued  Pericardial effusion Echocardiogram noted pericardial effusion not as significant as identified on CT Evaluated by cardiology who initiated on-call team at Cath Lab for pericardiocentesis    Left pleural effusion secondary to diastolic CHF Pulmonology evaluating patient Hemodynamically unstable currently     Hypotension Consistent with hemorrhagic shock Holding blood pressure medications Vasopressors to maintain MAP greater than 65 Rate and once hemodynamically stable   Hypokalemia Resolved. Monitor. Likely due to diuretics Daily oral supplementation   Hyponatremia,  Sodium of 127 likely from diuresis improved to 128. Fluid restriction discontinued. Na 136 resolved   CKD stage 3b -creatinine stable at 1.68 close to baseline around 1.4-1.7. I appreciate nephrology's help.   Permanent atrial fibrillation 10/12 resumed Eliquis 5 BID which as above has been discontinued   Type 2 diabetes Diet controlled   Iron deficiency, iron saturation 4%, started Venofer 200 mg IV daily for 5 days, followed by oral supplement at discharge. Vitamin B12 level 284, target >400, started vitamin B12 1000 mcg IM injection x2 doses followed by 500 mcg p.o. daily Folic acid level 8.3, at lower end, started oral supplement to prevent deficiency  DVT prophylaxis: SCD/Compression stockings  Code Status: Full code    Code Status Orders  (From admission, onward)           Start     Ordered   07/26/21 2042  Full code  Continuous        07/26/21 2042           Code Status History     Date Active Date Inactive Code Status Order ID Comments User Context   05/17/2021 0253 05/27/2021 1956 Full Code [id number]  [name], MD ED   04/11/2021 0143 04/18/2021 1712 Full Code [id number]  Orene Desanctis, DO ED      Advance Directive Documentation    Flowsheet Row Most Recent Value  Type of Advance Directive Healthcare Power of Attorney, Living will  Pre-existing out of facility DNR order (yellow form or pink MOST form) --  "MOST" Form in Place? --      Family Communication: Discussed with husband who was bedside today Disposition Plan:    Patient is critically ill remain in the ICU not medically stable for discharge with planned pericardiocentesis, endoluminal evaluation for GI bleed. Consults  called:  Gastroenterology, cardiology, nephrology, critical care Admission status: Inpatient   Consultants:  As above  Procedures:  DG Chest 2 View  Result Date: 07/20/2021 CLINICAL DATA:  Patient is here to follow up for pleural effusion. Patient complains of cough since April and has fluid retention. Patient denies asthma and is a non smoker, but has a history of a A-fib and has no other lung/heart issues or surgeries. EXAM: CHEST - 2 VIEW COMPARISON:  Radiography of the chest dated 05/21/2021. Radiography of the chest dated 05/20/2021. FINDINGS: Persistent enlargement of the cardiac silhouette. Atherosclerotic calcification of the aortic knob. Moderate-large left-sided pleural effusion with left basilar atelectasis. Right lung is clear. No pneumothorax. IMPRESSION: Persistent moderate-large left-sided pleural effusion with left basilar atelectasis. Electronically Signed   By: Davina Poke D.O.   On: 07/17/2021 11:03   US Paracentesis  Result Date: 08/01/2021 INDICATION: Ascites request received for diagnostic and therapeutic paracentesis. EXAM: ULTRASOUND GUIDED THERAPEUTIC PARACENTESIS MEDICATIONS: Local 1% lidocaine only. COMPLICATIONS: None immediate. PROCEDURE: Informed written consent was obtained from the patient after a discussion of the risks, benefits and alternatives to treatment. A timeout was performed prior to the initiation of the procedure.  Initial ultrasound scanning demonstrates a small amount of ascites within the left lower abdominal quadrant. The left lower abdomen was prepped and draped in the usual sterile fashion. 1% lidocaine was used for local anesthesia. Following this, a 19 gauge, 10-cm, Yueh catheter was introduced. An ultrasound image was saved for documentation purposes. The paracentesis was performed. The catheter was removed and a dressing was applied. The patient tolerated the procedure well without immediate post procedural complication. FINDINGS: A total of  approximately 1.2 L of amber colored slightly cloudy fluid was removed. Samples were sent to the laboratory as requested by the clinical team. IMPRESSION: Successful ultrasound-guided paracentesis yielding 1.2 liters of peritoneal fluid. Read By: Tsosie Billing PA-C Electronically Signed   By: Jerilynn Mages.  Shick M.D.   On: 08/01/2021 15:59   DG Chest Port 1 View  Result Date: 07/28/2021 CLINICAL DATA:  Post left-sided thoracentesis. EXAM: PORTABLE CHEST 1 VIEW COMPARISON:  Earlier same day; 07/26/2021 FINDINGS: Grossly unchanged enlarged cardiac silhouette and mediastinal contours with atherosclerotic plaque within the aortic arch. Interval reduction in persistent trace left-sided effusion post thoracentesis. Improved aeration of left lung base with persistent partial obscuration of the left heart border secondary to left basilar heterogeneous/consolidative opacities. The right hemithorax remains well aerated. Mild point is congestion without frank evidence of edema. No pneumothorax. No acute osseous abnormalities. IMPRESSION: 1. No evidence of complication following left-sided thoracentesis. Specifically, no pneumothorax. 2. Improved aeration of left lung base with persistent left basilar opacities, atelectasis versus infiltrate. 3. Otherwise, similar findings of cardiomegaly and pulmonary venous congestion without frank evidence of edema. 4.  Aortic Atherosclerosis (ICD10-I70.0). Electronically Signed   By: Sandi Mariscal M.D.   On: 07/28/2021 14:04   DG Chest Port 1 View  Result Date: 07/28/2021 CLINICAL DATA:  75 year old female with history of increasing shortness of breath with low oxygen saturations. EXAM: PORTABLE CHEST 1 VIEW COMPARISON:  Chest x-ray 07/26/2021. FINDINGS: Persistent opacification throughout the left mid to lower hemithorax. Right lung is clear. No definite right pleural effusion. No pneumothorax. Cardiac silhouette is obscured. The patient is rotated to the left on today's exam, resulting in  distortion of the mediastinal contours and reduced diagnostic sensitivity and specificity for mediastinal pathology. IMPRESSION: 1. Persistent opacification in the left mid to lower hemithorax compatible with areas of atelectasis and/or consolidation, likely with superimposed large left pleural effusion. 2. Aortic atherosclerosis. Electronically Signed   By: Vinnie Langton M.D.   On: 07/28/2021 10:32   DG Chest Portable 1 View  Result Date: 07/26/2021 CLINICAL DATA:  Shortness of breath EXAM: PORTABLE CHEST 1 VIEW COMPARISON:  07/16/2021 FINDINGS: Large left-sided pleural effusion slightly increased. Probable trace right effusion. Cardiomegaly with vascular congestion. Persistent consolidation in the left lower lung. IMPRESSION: Large left-sided pleural effusion with airspace disease in the left mid to lower lung. Trace right pleural effusion. Cardiomegaly with vascular congestion Electronically Signed   By: Donavan Foil M.D.   On: 07/26/2021 18:10   ECHOCARDIOGRAM COMPLETE  Result Date: 08/05/2021    ECHOCARDIOGRAM REPORT   Patient Name:   ZAIDE MCCLENAHAN Date of Exam: 08/05/2021 Medical Rec #:  [id number]          Height:       62.0 in Accession #:    [id number]         Weight:       223.3 lb Date of Birth:  10-14-1946           BSA:  2.003 m Patient Age:    34 years           BP:           89/53 mmHg Patient Gender: F                  HR:           107 bpm. Exam Location:  [facility] Procedure: 2D Echo, Cardiac Doppler and Color Doppler Indications:     Pericardial effusion I31.3  History:         Patient has prior history of Echocardiogram examinations. CHF;                  Risk Factors:Hypertension and Diabetes.  Sonographer:     Alyse Low Roar Referring Phys:  [id number] BRITTON L R[place] Diagnosing Phys: Ida Rogue MD IMPRESSIONS  1. Left ventricular ejection fraction, by estimation, is 60 to 65%. The left ventricle has normal function. The left ventricle has no regional wall motion  abnormalities. There is mild left ventricular hypertrophy. Left ventricular diastolic parameters are indeterminate.  2. Right ventricular systolic function is normal. The right ventricular size is normal. There is moderately elevated pulmonary artery systolic pressure. The estimated right ventricular systolic pressure is 14.7 mmHg.  3. Left atrial size was mildly dilated.  4. The mitral valve is normal in structure. Mild mitral valve regurgitation.  5. The aortic valve was not well visualized,Calcification noted. Aortic valve regurgitation is mild. Mild to moderate aortic valve sclerosis/calcification is present, unable to exclude mild aortic valve regurgitation.  6. The inferior vena cava is dilated in size with <50% respiratory variability, suggesting right atrial pressure of 15 mmHg.  7. A large pericardial effusion is present. 2.74 to 3.39 cm circumferential, no significant RA or RV collapse. Mitral and tricuspid valve in-flow velocities not measured. Grossly, there does not appear to be tamponade. Of concern is the dilated IVC with  little respiratory variation and moderately elevated right heart pressures. FINDINGS  Left Ventricle: Left ventricular ejection fraction, by estimation, is 60 to 65%. The left ventricle has normal function. The left ventricle has no regional wall motion abnormalities. The left ventricular internal cavity size was normal in size. There is  mild left ventricular hypertrophy. Left ventricular diastolic parameters are indeterminate. Right Ventricle: The right ventricular size is normal. No increase in right ventricular wall thickness. Right ventricular systolic function is normal. There is moderately elevated pulmonary artery systolic pressure. The tricuspid regurgitant velocity is 3.14 m/s, and with an assumed right atrial pressure of 15 mmHg, the estimated right ventricular systolic pressure is 82.9 mmHg. Left Atrium: Left atrial size was mildly dilated. Right Atrium: Right atrial  size was normal in size. Pericardium: A large pericardial effusion is present. Mitral Valve: The mitral valve is normal in structure. Mild mitral valve regurgitation. No evidence of mitral valve stenosis. Tricuspid Valve: The tricuspid valve is normal in structure. Tricuspid valve regurgitation is mild . No evidence of tricuspid stenosis. Aortic Valve: The aortic valve was not well visualized. Aortic valve regurgitation is mild. Mild to moderate aortic valve sclerosis/calcification is present, without any evidence of aortic stenosis. Aortic valve mean gradient measures 13.0 mmHg. Aortic valve peak gradient measures 26.0 mmHg. Aortic valve area, by VTI measures 0.81 cm. Pulmonic Valve: The pulmonic valve was normal in structure. Pulmonic valve regurgitation is not visualized. No evidence of pulmonic stenosis. Aorta: The aortic root is normal in size and structure. Venous: The inferior vena cava is dilated in size  with less than 50% respiratory variability, suggesting right atrial pressure of 15 mmHg. IAS/Shunts: No atrial level shunt detected by color flow Doppler.  LEFT VENTRICLE PLAX 2D LVIDd:         3.09 cm   Diastology LVIDs:         2.12 cm   LV e' medial:    7.51 cm/s LV PW:         0.78 cm   LV E/e' medial:  17.0 LV IVS:        1.02 cm   LV e' lateral:   6.80 cm/s LVOT diam:     1.70 cm   LV E/e' lateral: 18.8 LV SV:         33 LV SV Index:   17 LVOT Area:     2.27 cm  RIGHT VENTRICLE RV Mid diam:    2.15 cm RV S prime:     9.96 cm/s LEFT ATRIUM             Index LA diam:        4.37 cm 2.18 cm/m LA Vol (A2C):   27.9 ml 13.93 ml/m LA Vol (A4C):   29.0 ml 14.48 ml/m LA Biplane Vol: 30.8 ml 15.37 ml/m  AORTIC VALVE                     PULMONIC VALVE AV Area (Vmax):    0.93 cm      PV Vmax:        0.83 m/s AV Area (Vmean):   0.91 cm      PV Peak grad:   2.7 mmHg AV Area (VTI):     0.81 cm      RVOT Peak grad: 2 mmHg AV Vmax:           255.00 cm/s AV Vmean:          162.000 cm/s AV VTI:            0.410  m AV Peak Grad:      26.0 mmHg AV Mean Grad:      13.0 mmHg LVOT Vmax:         105.00 cm/s LVOT Vmean:        65.000 cm/s LVOT VTI:          0.147 m LVOT/AV VTI ratio: 0.36  AORTA Ao Root diam: 2.50 cm MITRAL VALVE                TRICUSPID VALVE MV Area (PHT): 3.83 cm     TR Peak grad:   39.4 mmHg MV Decel Time: 198 msec     TR Vmax:        314.00 cm/s MV E velocity: 128.00 cm/s                             SHUNTS                             Systemic VTI:  0.15 m                             Systemic Diam: 1.70 cm Ida Rogue MD Electronically signed by Ida Rogue MD Signature Date/Time: 08/05/2021/3:49:45 PM    Final    CT ANGIO GI BLEED  Result Date: 08/04/2021 CLINICAL DATA:  Painless bloody stools with  down trending hemoglobin, initial encounter EXAM: CTA ABDOMEN AND PELVIS WITHOUT AND WITH CONTRAST TECHNIQUE: Multidetector CT imaging of the abdomen and pelvis was performed using the standard protocol during bolus administration of intravenous contrast. Multiplanar reconstructed images and MIPs were obtained and reviewed to evaluate the vascular anatomy. CONTRAST:  43mL OMNIPAQUE IOHEXOL 350 MG/ML SOLN COMPARISON:  None. FINDINGS: VASCULAR Aorta: Atherosclerotic calcifications of the abdominal aorta are noted. No aneurysmal dilatation or dissection is seen. The aorta is somewhat diminutive Celiac: Atherosclerotic calcifications are noted at the origin with approximately 50% stenosis and mild poststenotic dilatation in the celiac. SMA: Atherosclerotic calcifications are noted with high-grade stenosis although no significant poststenotic dilatation is seen. Renals: Single renal arteries are identified bilaterally. High-grade stenosis is noted in the origin of the renal arteries bilaterally with somewhat diminutive for renal artery branches likely related to the hypotensive state. IMA: Patent without evidence of aneurysm, dissection, vasculitis or significant stenosis. Inflow: Iliacs demonstrate  atherosclerotic calcification without focal high-grade stenosis. Veins: No specific venous abnormality is noted. Review of the MIP images confirms the above findings. NON-VASCULAR Lower chest: Lung bases demonstrate evidence of left-sided pleural effusion with left lower lobe consolidation. Large pericardial effusion is noted Hepatobiliary: Liver is fatty infiltrated. Some mild nodularity is noted suggestive of underlying cirrhosis. Mild perihepatic ascites is seen. The gallbladder has been surgically removed. Pancreas: Unremarkable. No pancreatic ductal dilatation or surrounding inflammatory changes. Spleen: Normal in size without focal abnormality. Adrenals/Urinary Tract: Adrenal glands appear within normal limits. Kidneys demonstrate a normal enhancement pattern bilaterally. No obstructive changes are seen. The bladder is partially distended. Stomach/Bowel: In the rectum on the arterial phase images there is a focal area of contrast extravasation identified best seen on image number 225 of series 5 and image number 135 and 134 of series 10. Subsequent venous phase images show this to be more diffusely distributed within the rectum consistent with focal GI bleeding. No other area of contrast extravasation is seen. No other pooling of contrast is noted within the colon. Scattered diverticular change is seen without evidence of diverticulitis. Small bowel and stomach appear within normal limits. Lymphatic: No significant lymphadenopathy is noted. Reproductive: Uterus demonstrates multiple calcified uterine fibroids. No definitive adnexal mass is seen. Other: Mild ascites is noted surrounding the liver and spleen. Mild changes of anasarca are noted Musculoskeletal: Degenerative changes of lumbar spine and hip joints are noted. No acute bony abnormality is seen. IMPRESSION: VASCULAR Changes consistent with active GI hemorrhage within the distal rectum as described. No other focal area of acute hemorrhage is seen.  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 Mild ascites is noted as well. Left-sided pleural effusion with lower lobe consolidation. Large pericardial effusion. Diverticulosis without diverticulitis. Uterine fibroid change. Aortic Atherosclerosis (ICD10-I70.0). Critical Value/emergent results were called by telephone at the time of interpretation on 08/04/2021 at 11:27 pm to [facility], NP , who verbally acknowledged these results. Electronically Signed   By: Inez Catalina M.D.   On: 08/04/2021 23:31   US Abdomen Limited RUQ (LIVER/GB)  Result Date: 08/01/2021 CLINICAL DATA:  75 year old female with history of cirrhosis. EXAM: ULTRASOUND ABDOMEN LIMITED RIGHT UPPER QUADRANT COMPARISON:  No prior. FINDINGS: Gallbladder: Status post cholecystectomy. Common bile duct: Diameter: 4 mm Liver: No focal lesion identified. Within normal limits in parenchymal echogenicity. Portal vein is patent on color Doppler imaging with normal direction of blood flow towards the liver. Other: None.  IMPRESSION: 1. No acute findings. 2. Status post cholecystectomy. Electronically Signed   By: Vinnie Langton M.D.   On: 08/01/2021 18:07   US THORACENTESIS ASP PLEURAL SPACE W/IMG GUIDE  Result Date: 07/28/2021 INDICATION: Symptomatic left-sided pleural effusion. Please from ultrasound-guided thoracentesis for diagnostic and therapeutic purposes. EXAM: US THORACENTESIS ASP PLEURAL SPACE W/IMG GUIDE COMPARISON:  Chest radiograph-earlier same day; 05/20/2021; ultrasound-guided left-sided thoracentesis yielding 400 cc pleural fluid. MEDICATIONS: None. COMPLICATIONS: None immediate. TECHNIQUE: Informed written consent was obtained from the patient after a discussion of the risks, benefits and alternatives to treatment. A timeout was performed prior to the initiation of the procedure. Initial  ultrasound scanning demonstrates a small anechoic left-sided pleural effusion. The lower chest was prepped and draped in the usual sterile fashion. 1% lidocaine was used for local anesthesia. Under direct ultrasound guidance, an 8 Fr Safe-T-Centesis catheter was introduced. The thoracentesis was performed. The catheter was removed and a dressing was applied. The patient tolerated the procedure well without immediate post procedural complication. The patient was escorted to have an upright chest radiograph. FINDINGS: A total of approximately 600 cc of serous fluid was removed. Requested samples were sent to the laboratory. IMPRESSION: Successful ultrasound-guided left sided thoracentesis yielding 600 cc of pleural fluid. Electronically Signed   By: Sandi Mariscal M.D.   On: 07/28/2021 14:06       Subjective: Patient with complications last night to include hemorrhagic shock secondary to GI bleed transferred to ICU, initiation of pressors, cardiology consultation with CT showing pericardial effusion requiring pericardiocentesis.  Objective: Vitals:   08/05/21 1615 08/05/21 1630 08/05/21 1645 08/05/21 1700  BP: 92/61 (!) 106/57 (!) 113/53 108/74  Pulse: 60 94 72 92  Resp: 15 19 17 20   Temp:      TempSrc:      SpO2: 100% 99% 100% 100%  Weight:      Height:        Intake/Output Summary (Last 24 hours) at 08/05/2021 1715 Last data filed at 08/05/2021 1500 Gross per 24 hour  Intake 3002.74 ml  Output 1000 ml  Net 2002.74 ml   Filed Weights   08/04/21 0445 08/04/21 2020 08/05/21 0413  Weight: 99.5 kg 101.7 kg 101.3 kg    Examination:  General exam: Chronically ill-appearing Respiratory system: Clear to auscultation. Respiratory effort normal. Cardiovascular system: S1 & S2 heard, RRR. No JVD, murmurs, rubs, gallops or clicks. No pedal edema. Gastrointestinal system: Abdomen is nondistended, soft and nontender. No organomegaly or masses felt. Normal bowel sounds heard. Central nervous  system: Alert and oriented. No focal neurological deficits. Extremities: Warm well perfused 2-3+ pitting edema Skin: No rashes, lesions or ulcers Psychiatry: Judgement and insight appear normal. Mood & affect appropriate.     Data Reviewed: I have personally reviewed following labs and imaging studies  CBC: Recent Labs  Lab 08/01/21 0434 08/02/21 0432 08/03/21 0454 08/04/21 0504 08/04/21 1233 08/04/21 1845 08/05/21 0444 08/05/21 1106  WBC 11.3* 9.6 8.1 9.0 10.9*  --  13.1*  --   NEUTROABS 9.9*  --   --   --   --   --   --   --   HGB 13.6 12.9 13.3 12.4 9.7* 7.7* 11.2* 10.3*  HCT 41.9 40.2 41.0 38.4 30.0* 24.0* 32.8* 29.9*  MCV 75.9* 75.3* 75.1* 74.9* 75.9*  --  80.2  --   PLT 398 337 332 311 288  --  244  --    Basic Metabolic Panel: Recent Labs  Lab 08/01/21 0434 08/02/21  9509 08/03/21 0454 08/04/21 0504 08/05/21 0444  NA 128* 131* 135 136 140  K 5.1 4.8 4.1 3.8 4.0  CL 91* 93* 95* 93* 100  CO2 22 27 29 31 30   GLUCOSE 227* 253* 280* 214* 244*  BUN 54* 57* 60* 62* 87*  CREATININE 1.94* 1.84* 1.75* 1.75* 1.55*  CALCIUM 9.3 9.2 9.2 8.9 8.9  MG  --  2.4 2.7* 2.2 2.1  PHOS  --  3.5 3.3 2.7 3.7   GFR: Estimated Creatinine Clearance: 35 mL/min (A) (by C-G formula based on SCr of 1.55 mg/dL (H)). Liver Function Tests: No results for input(s): AST, ALT, ALKPHOS, BILITOT, PROT, ALBUMIN in the last 168 hours. No results for input(s): LIPASE, AMYLASE in the last 168 hours. No results for input(s): AMMONIA in the last 168 hours. Coagulation Profile: Recent Labs  Lab 08/05/21 0444  INR 1.4*   Cardiac Enzymes: No results for input(s): CKTOTAL, CKMB, CKMBINDEX, TROPONINI in the last 168 hours. BNP (last 3 results) No results for input(s): PROBNP in the last 8760 hours. HbA1C: No results for input(s): HGBA1C in the last 72 hours. CBG: Recent Labs  Lab 08/04/21 1949 08/04/21 2245 08/05/21 0728 08/05/21 1159 08/05/21 1559  GLUCAP 252* 212* 222* 177* 184*    Lipid Profile: No results for input(s): CHOL, HDL, LDLCALC, TRIG, CHOLHDL, LDLDIRECT in the last 72 hours. Thyroid Function Tests: No results for input(s): TSH, T4TOTAL, FREET4, T3FREE, THYROIDAB in the last 72 hours. Anemia Panel: No results for input(s): VITAMINB12, FOLATE, FERRITIN, TIBC, IRON, RETICCTPCT in the last 72 hours. Sepsis Labs: No results for input(s): PROCALCITON, LATICACIDVEN in the last 168 hours.  Recent Results (from the past 240 hour(s))  Resp Panel by RT-PCR (Flu A&B, Covid) Nasopharyngeal Swab     Status: None   Collection Time: 07/26/21  7:19 PM   Specimen: Nasopharyngeal Swab; Nasopharyngeal(NP) swabs in vial transport medium  Result Value Ref Range Status   SARS Coronavirus 2 by RT PCR NEGATIVE NEGATIVE Final    Comment: (NOTE) SARS-CoV-2 target nucleic acids are NOT DETECTED.  The SARS-CoV-2 RNA is generally detectable in upper respiratory specimens during the acute phase of infection. The lowest concentration of SARS-CoV-2 viral copies this assay can detect is 138 copies/mL. A negative result does not preclude SARS-Cov-2 infection and should not be used as the sole basis for treatment or other patient management decisions. A negative result may occur with  improper specimen collection/handling, submission of specimen other than nasopharyngeal swab, presence of viral mutation(s) within the areas targeted by this assay, and inadequate number of viral copies(<138 copies/mL). A negative result must be combined with clinical observations, patient history, and epidemiological information. The expected result is Negative.  Fact Sheet for Patients:  EntrepreneurPulse.com.au  Fact Sheet for Healthcare Providers:  IncredibleEmployment.be  This test is no t yet approved or cleared by the [place] FDA and  has been authorized for detection and/or diagnosis of SARS-CoV-2 by FDA under an Emergency Use Authorization  (EUA). This EUA will remain  in effect (meaning this test can be used) for the duration of the COVID-19 declaration under Section 564(b)(1) of the Act, 21 U.S.C.section 360bbb-3(b)(1), unless the authorization is terminated  or revoked sooner.       Influenza A by PCR NEGATIVE NEGATIVE Final   Influenza B by PCR NEGATIVE NEGATIVE Final    Comment: (NOTE) The Xpert Xpress SARS-CoV-2/FLU/RSV plus assay is intended as an aid in the diagnosis of influenza from Nasopharyngeal swab specimens and should not  be used as a sole basis for treatment. Nasal washings and aspirates are unacceptable for Xpert Xpress SARS-CoV-2/FLU/RSV testing.  Fact Sheet for Patients: EntrepreneurPulse.com.au  Fact Sheet for Healthcare Providers: IncredibleEmployment.be  This test is not yet approved or cleared by the [place] FDA and has been authorized for detection and/or diagnosis of SARS-CoV-2 by FDA under an Emergency Use Authorization (EUA). This EUA will remain in effect (meaning this test can be used) for the duration of the COVID-19 declaration under Section 564(b)(1) of the Act, 21 U.S.C. section 360bbb-3(b)(1), unless the authorization is terminated or revoked.  Performed at [facility], [address] [place], [place] [address]   Body fluid culture w Gram Stain     Status: None   Collection Time: 07/28/21  1:53 PM   Specimen: PATH Cytology Pleural fluid  Result Value Ref Range Status   Specimen Description   Final    PLEURAL Performed at [facility], [address] [place], [place] [address]    Special Requests   Final    NONE Performed at [facility] [address] [place], [place] [address]    Gram Stain   Final    NO SQUAMOUS EPITHELIAL CELLS SEEN FEW WBC SEEN NO ORGANISMS SEEN    Culture   Final    NO GROWTH 3 DAYS Performed at [facility], [place] [address] [place], [place] [address]    Report  Status 08/01/2021 FINAL  Final  Body fluid culture w Gram Stain     Status: None   Collection Time: 08/01/21  3:36 PM   Specimen: PATH Cytology Peritoneal fluid  Result Value Ref Range Status   Specimen Description   Final    PERITONEAL Performed at [facility], [address], [place], [place] [address]    Special Requests   Final    NONE Performed at [facility], [address] [place], [place] [address]    Gram Stain   Final    RARE WBC PRESENT,BOTH PMN AND MONONUCLEAR NO ORGANISMS SEEN    Culture   Final    NO GROWTH 3 DAYS Performed at [facility], [place] [address], [place], [place] [address]    Report Status 08/05/2021 FINAL  Final  MRSA Next Gen by PCR, Nasal     Status: None   Collection Time: 08/04/21  8:56 PM   Specimen: Nasal Mucosa; Nasal Swab  Result Value Ref Range Status   MRSA by PCR Next Gen NOT DETECTED NOT DETECTED Final    Comment: (NOTE) The GeneXpert MRSA Assay (FDA approved for NASAL specimens only), is one component of a comprehensive MRSA colonization surveillance program. It is not intended to diagnose MRSA infection nor to guide or monitor treatment for MRSA infections. Test performance is not FDA approved in patients less than 12 years old. Performed at [facility], [address] [place], [place] [address]          Radiology Studies: ECHOCARDIOGRAM COMPLETE  Result Date: 08/05/2021    ECHOCARDIOGRAM REPORT   Patient Name:   Karen Farley Date of Exam: 08/05/2021 Medical Rec #:  [id number]          Height:       62.0 in Accession #:    [id number]         Weight:       223.3 lb Date of Birth:  Jun 24, 1946  BSA:          2.003 m Patient Age:    66 years           BP:           89/53 mmHg Patient Gender: F                  HR:           107 bpm. Exam Location:  [facility] Procedure: 2D Echo, Cardiac Doppler and Color Doppler Indications:     Pericardial effusion I31.3  History:         Patient has  prior history of Echocardiogram examinations. CHF;                  Risk Factors:Hypertension and Diabetes.  Sonographer:     Alyse Low Roar Referring Phys:  [id number] BRITTON L RUST-CHESTER Diagnosing Phys: Ida Rogue MD IMPRESSIONS  1. Left ventricular ejection fraction, by estimation, is 60 to 65%. The left ventricle has normal function. The left ventricle has no regional wall motion abnormalities. There is mild left ventricular hypertrophy. Left ventricular diastolic parameters are indeterminate.  2. Right ventricular systolic function is normal. The right ventricular size is normal. There is moderately elevated pulmonary artery systolic pressure. The estimated right ventricular systolic pressure is 74.2 mmHg.  3. Left atrial size was mildly dilated.  4. The mitral valve is normal in structure. Mild mitral valve regurgitation.  5. The aortic valve was not well visualized,Calcification noted. Aortic valve regurgitation is mild. Mild to moderate aortic valve sclerosis/calcification is present, unable to exclude mild aortic valve regurgitation.  6. The inferior vena cava is dilated in size with <50% respiratory variability, suggesting right atrial pressure of 15 mmHg.  7. A large pericardial effusion is present. 2.74 to 3.39 cm circumferential, no significant RA or RV collapse. Mitral and tricuspid valve in-flow velocities not measured. Grossly, there does not appear to be tamponade. Of concern is the dilated IVC with  little respiratory variation and moderately elevated right heart pressures. FINDINGS  Left Ventricle: Left ventricular ejection fraction, by estimation, is 60 to 65%. The left ventricle has normal function. The left ventricle has no regional wall motion abnormalities. The left ventricular internal cavity size was normal in size. There is  mild left ventricular hypertrophy. Left ventricular diastolic parameters are indeterminate. Right Ventricle: The right ventricular size is normal. No increase in  right ventricular wall thickness. Right ventricular systolic function is normal. There is moderately elevated pulmonary artery systolic pressure. The tricuspid regurgitant velocity is 3.14 m/s, and with an assumed right atrial pressure of 15 mmHg, the estimated right ventricular systolic pressure is 59.5 mmHg. Left Atrium: Left atrial size was mildly dilated. Right Atrium: Right atrial size was normal in size. Pericardium: A large pericardial effusion is present. Mitral Valve: The mitral valve is normal in structure. Mild mitral valve regurgitation. No evidence of mitral valve stenosis. Tricuspid Valve: The tricuspid valve is normal in structure. Tricuspid valve regurgitation is mild . No evidence of tricuspid stenosis. Aortic Valve: The aortic valve was not well visualized. Aortic valve regurgitation is mild. Mild to moderate aortic valve sclerosis/calcification is present, without any evidence of aortic stenosis. Aortic valve mean gradient measures 13.0 mmHg. Aortic valve peak gradient measures 26.0 mmHg. Aortic valve area, by VTI measures 0.81 cm. Pulmonic Valve: The pulmonic valve was normal in structure. Pulmonic valve regurgitation is not visualized. No evidence of pulmonic stenosis. Aorta: The aortic root is normal in size and  structure. Venous: The inferior vena cava is dilated in size with less than 50% respiratory variability, suggesting right atrial pressure of 15 mmHg. IAS/Shunts: No atrial level shunt detected by color flow Doppler.  LEFT VENTRICLE PLAX 2D LVIDd:         3.09 cm   Diastology LVIDs:         2.12 cm   LV e' medial:    7.51 cm/s LV PW:         0.78 cm   LV E/e' medial:  17.0 LV IVS:        1.02 cm   LV e' lateral:   6.80 cm/s LVOT diam:     1.70 cm   LV E/e' lateral: 18.8 LV SV:         33 LV SV Index:   17 LVOT Area:     2.27 cm  RIGHT VENTRICLE RV Mid diam:    2.15 cm RV S prime:     9.96 cm/s LEFT ATRIUM             Index LA diam:        4.37 cm 2.18 cm/m LA Vol (A2C):   27.9 ml  13.93 ml/m LA Vol (A4C):   29.0 ml 14.48 ml/m LA Biplane Vol: 30.8 ml 15.37 ml/m  AORTIC VALVE                     PULMONIC VALVE AV Area (Vmax):    0.93 cm      PV Vmax:        0.83 m/s AV Area (Vmean):   0.91 cm      PV Peak grad:   2.7 mmHg AV Area (VTI):     0.81 cm      RVOT Peak grad: 2 mmHg AV Vmax:           255.00 cm/s AV Vmean:          162.000 cm/s AV VTI:            0.410 m AV Peak Grad:      26.0 mmHg AV Mean Grad:      13.0 mmHg LVOT Vmax:         105.00 cm/s LVOT Vmean:        65.000 cm/s LVOT VTI:          0.147 m LVOT/AV VTI ratio: 0.36  AORTA Ao Root diam: 2.50 cm MITRAL VALVE                TRICUSPID VALVE MV Area (PHT): 3.83 cm     TR Peak grad:   39.4 mmHg MV Decel Time: 198 msec     TR Vmax:        314.00 cm/s MV E velocity: 128.00 cm/s                             SHUNTS                             Systemic VTI:  0.15 m                             Systemic Diam: 1.70 cm Ida Rogue MD Electronically signed by Ida Rogue MD Signature Date/Time: 08/05/2021/3:49:45 PM    Final    CT ANGIO GI BLEED  Result Date: 08/04/2021 CLINICAL DATA:  Painless bloody stools with down trending hemoglobin, initial encounter EXAM: CTA ABDOMEN AND PELVIS WITHOUT AND WITH CONTRAST TECHNIQUE: Multidetector CT imaging of the abdomen and pelvis was performed using the standard protocol during bolus administration of intravenous contrast. Multiplanar reconstructed images and MIPs were obtained and reviewed to evaluate the vascular anatomy. CONTRAST:  84mL OMNIPAQUE IOHEXOL 350 MG/ML SOLN COMPARISON:  None. FINDINGS: VASCULAR Aorta: Atherosclerotic calcifications of the abdominal aorta are noted. No aneurysmal dilatation or dissection is seen. The aorta is somewhat diminutive Celiac: Atherosclerotic calcifications are noted at the origin with approximately 50% stenosis and mild poststenotic dilatation in the celiac. SMA: Atherosclerotic calcifications are noted with high-grade stenosis although no  significant poststenotic dilatation is seen. Renals: Single renal arteries are identified bilaterally. High-grade stenosis is noted in the origin of the renal arteries bilaterally with somewhat diminutive for renal artery branches likely related to the hypotensive state. IMA: Patent without evidence of aneurysm, dissection, vasculitis or significant stenosis. Inflow: Iliacs demonstrate atherosclerotic calcification without focal high-grade stenosis. Veins: No specific venous abnormality is noted. Review of the MIP images confirms the above findings. NON-VASCULAR Lower chest: Lung bases demonstrate evidence of left-sided pleural effusion with left lower lobe consolidation. Large pericardial effusion is noted Hepatobiliary: Liver is fatty infiltrated. Some mild nodularity is noted suggestive of underlying cirrhosis. Mild perihepatic ascites is seen. The gallbladder has been surgically removed. Pancreas: Unremarkable. No pancreatic ductal dilatation or surrounding inflammatory changes. Spleen: Normal in size without focal abnormality. Adrenals/Urinary Tract: Adrenal glands appear within normal limits. Kidneys demonstrate a normal enhancement pattern bilaterally. No obstructive changes are seen. The bladder is partially distended. Stomach/Bowel: In the rectum on the arterial phase images there is a focal area of contrast extravasation identified best seen on image number 225 of series 5 and image number 135 and 134 of series 10. Subsequent venous phase images show this to be more diffusely distributed within the rectum consistent with focal GI bleeding. No other area of contrast extravasation is seen. No other pooling of contrast is noted within the colon. Scattered diverticular change is seen without evidence of diverticulitis. Small bowel and stomach appear within normal limits. Lymphatic: No significant lymphadenopathy is noted. Reproductive: Uterus demonstrates multiple calcified uterine fibroids. No definitive  adnexal mass is seen. Other: Mild ascites is noted surrounding the liver and spleen. Mild changes of anasarca are noted Musculoskeletal: Degenerative changes of lumbar spine and hip joints are noted. No acute bony abnormality is seen. IMPRESSION: VASCULAR Changes consistent with active GI hemorrhage within the distal rectum as described. No other focal area of acute hemorrhage is seen. 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 Mild ascites is noted as well. Left-sided pleural effusion with lower lobe consolidation. Large pericardial effusion. Diverticulosis without diverticulitis. Uterine fibroid change. Aortic Atherosclerosis (ICD10-I70.0). Critical Value/emergent results were called by telephone at the time of interpretation on 08/04/2021 at 11:27 pm to [facility], NP , who verbally acknowledged these results. Electronically Signed   By: Inez Catalina M.D.   On: 08/04/2021 23:31        Scheduled Meds:  Chlorhexidine Gluconate Cloth  6 each Topical Daily   folic acid  1 mg Oral Daily   insulin aspart  0-5 Units Subcutaneous QHS   insulin aspart  0-9 Units Subcutaneous TID WC   latanoprost  1 drop Both Eyes QHS   midodrine  5 mg Oral  TID WC   pantoprazole (PROTONIX) IV  40 mg Intravenous Q12H   Ensure Max Protein  11 oz Oral BID   vitamin B-12  500 mcg Oral Daily   Continuous Infusions:  sodium chloride Stopped (08/05/21 3435)   albumin human Stopped (08/05/21 0751)   iron sucrose 200 mg (08/05/21 1241)   phenylephrine (NEO-SYNEPHRINE) Adult infusion 35 mcg/min (08/05/21 1701)     LOS: 10 days    Time spent: 35 min    Nicolette Bang, MD [facility]  If 7PM-7AM, please contact night-coverage  08/05/2021, 5:15 PM

## 2021-08-06 ENCOUNTER — Inpatient Hospital Stay (HOSPITAL_COMMUNITY)
Admit: 2021-08-06 | Discharge: 2021-08-06 | Disposition: A | Discharge status: Home / Self Care | Payer: Medicare PPO | Attending: Internal Medicine | Admitting: Internal Medicine

## 2021-08-06 ENCOUNTER — Encounter: Payer: Self-pay | Admitting: Cardiology

## 2021-08-06 DIAGNOSIS — I4821 Permanent atrial fibrillation: Secondary | ICD-10-CM | POA: Diagnosis not present

## 2021-08-06 DIAGNOSIS — Z7901 Long term (current) use of anticoagulants: Secondary | ICD-10-CM | POA: Diagnosis not present

## 2021-08-06 DIAGNOSIS — I3139 Other pericardial effusion (noninflammatory): Secondary | ICD-10-CM

## 2021-08-06 DIAGNOSIS — I5033 Acute on chronic diastolic (congestive) heart failure: Secondary | ICD-10-CM | POA: Diagnosis not present

## 2021-08-06 DIAGNOSIS — R579 Shock, unspecified: Secondary | ICD-10-CM

## 2021-08-06 DIAGNOSIS — D62 Acute posthemorrhagic anemia: Secondary | ICD-10-CM | POA: Diagnosis not present

## 2021-08-06 LAB — TYPE AND SCREEN
ABO/RH(D): A POS
Antibody Screen: NEGATIVE
Unit division: 0
Unit division: 0

## 2021-08-06 LAB — GLUCOSE, CAPILLARY
Glucose-Capillary: 217 mg/dL — ABNORMAL HIGH (ref 70–99)
Glucose-Capillary: 131 mg/dL — ABNORMAL HIGH (ref 70–99)
Glucose-Capillary: 149 mg/dL — ABNORMAL HIGH (ref 70–99)
Glucose-Capillary: 207 mg/dL — ABNORMAL HIGH (ref 70–99)
Glucose-Capillary: 161 mg/dL — ABNORMAL HIGH (ref 70–99)

## 2021-08-06 LAB — CBC WITH DIFFERENTIAL/PLATELET
Abs Immature Granulocytes: 0.25 10*3/uL — ABNORMAL HIGH (ref 0.00–0.07)
Basophils Absolute: 0 10*3/uL (ref 0.0–0.1)
Basophils Relative: 0 %
Eosinophils Absolute: 0.5 10*3/uL (ref 0.0–0.5)
Eosinophils Relative: 3 %
HCT: 31.9 % — ABNORMAL LOW (ref 36.0–46.0)
Hemoglobin: 10.3 g/dL — ABNORMAL LOW (ref 12.0–15.0)
Immature Granulocytes: 2 %
Lymphocytes Relative: 9 %
Lymphs Abs: 1.3 10*3/uL (ref 0.7–4.0)
MCH: 27.8 pg (ref 26.0–34.0)
MCHC: 32.3 g/dL (ref 30.0–36.0)
MCV: 86.2 fL (ref 80.0–100.0)
Monocytes Absolute: 1.7 10*3/uL — ABNORMAL HIGH (ref 0.1–1.0)
Monocytes Relative: 12 %
Neutro Abs: 10.8 10*3/uL — ABNORMAL HIGH (ref 1.7–7.7)
Neutrophils Relative %: 74 %
Platelets: 227 10*3/uL (ref 150–400)
RBC: 3.7 MIL/uL — ABNORMAL LOW (ref 3.87–5.11)
RDW: 19.8 % — ABNORMAL HIGH (ref 11.5–15.5)
WBC: 14.6 10*3/uL — ABNORMAL HIGH (ref 4.0–10.5)
nRBC: 1.7 % — ABNORMAL HIGH (ref 0.0–0.2)

## 2021-08-06 LAB — COMPREHENSIVE METABOLIC PANEL
ALT: 15 U/L (ref 0–44)
AST: 21 U/L (ref 15–41)
Albumin: 3.6 g/dL (ref 3.5–5.0)
Alkaline Phosphatase: 40 U/L (ref 38–126)
Anion gap: 11 (ref 5–15)
BUN: 67 mg/dL — ABNORMAL HIGH (ref 8–23)
CO2: 28 mmol/L (ref 22–32)
Calcium: 9.3 mg/dL (ref 8.9–10.3)
Chloride: 104 mmol/L (ref 98–111)
Creatinine, Ser: 1.43 mg/dL — ABNORMAL HIGH (ref 0.44–1.00)
GFR, Estimated: 38 mL/min — ABNORMAL LOW (ref >60)
Glucose, Bld: 148 mg/dL — ABNORMAL HIGH (ref 70–99)
Potassium: 3.4 mmol/L — ABNORMAL LOW (ref 3.5–5.1)
Sodium: 143 mmol/L (ref 135–145)
Total Bilirubin: 2.6 mg/dL — ABNORMAL HIGH (ref 0.3–1.2)
Total Protein: 5.3 g/dL — ABNORMAL LOW (ref 6.5–8.1)

## 2021-08-06 LAB — PHOSPHORUS: Phosphorus: 3.5 mg/dL (ref 2.5–4.6)

## 2021-08-06 LAB — BPAM RBC
Blood Product Expiration Date: 202211112359
Blood Product Expiration Date: 202211122359
ISSUE DATE / TIME: 202210152232
ISSUE DATE / TIME: 202210160047
Unit Type and Rh: 6200
Unit Type and Rh: 6200

## 2021-08-06 LAB — HEMOGLOBIN AND HEMATOCRIT, BLOOD
HCT: 29.5 % — ABNORMAL LOW (ref 36.0–46.0)
Hemoglobin: 9.3 g/dL — ABNORMAL LOW (ref 12.0–15.0)

## 2021-08-06 LAB — ECHOCARDIOGRAM LIMITED
Height: 62 in
S' Lateral: 2.6 cm
Weight: 3597.91 oz

## 2021-08-06 LAB — MAGNESIUM: Magnesium: 2.2 mg/dL (ref 1.7–2.4)

## 2021-08-06 LAB — PATHOLOGIST SMEAR REVIEW

## 2021-08-06 MED ORDER — LACTATED RINGERS IV BOLUS
1000.0000 mL | Freq: Once | INTRAVENOUS | Status: AC
  Administered 2021-08-06: 1000 mL via INTRAVENOUS

## 2021-08-06 MED ORDER — PHENYLEPHRINE CONCENTRATED 100MG/250ML (0.4 MG/ML) INFUSION SIMPLE
25.0000 ug/min | INTRAVENOUS | Status: DC
  Administered 2021-08-06: 80 ug/min via INTRAVENOUS
  Administered 2021-08-07: 40 ug/min via INTRAVENOUS
  Filled 2021-08-06 (×2): qty 250

## 2021-08-06 MED ORDER — POLYSACCHARIDE IRON COMPLEX 150 MG PO CAPS
150.0000 mg | ORAL_CAPSULE | Freq: Every day | ORAL | Status: DC
  Administered 2021-08-06 – 2021-08-28 (×22): 150 mg via ORAL
  Filled 2021-08-06 (×26): qty 1

## 2021-08-06 MED ORDER — MIDODRINE HCL 5 MG PO TABS
10.0000 mg | ORAL_TABLET | Freq: Three times a day (TID) | ORAL | Status: DC
  Administered 2021-08-06 – 2021-08-28 (×66): 10 mg via ORAL
  Filled 2021-08-06 (×66): qty 2

## 2021-08-06 NOTE — Progress Notes (Signed)
GI Inpatient Follow-up Note  Subjective:  Patient seen in follow-up for lower GI bleeding. Husband in room. No acute events overnight. No further rectal bleeding. Hemoglobin is stable. She denies any abdominal pain or abdominal cramping. No nausea or vomiting. Dr. Saunders Revel of Cardiology saw patient today and she is s/p pericardiocentesis yesterday. She is still requiring pressers.   Scheduled Inpatient Medications:   Chlorhexidine Gluconate Cloth  6 each Topical Daily   folic acid  1 mg Oral Daily   insulin aspart  0-5 Units Subcutaneous QHS   insulin aspart  0-9 Units Subcutaneous TID WC   latanoprost  1 drop Both Eyes QHS   midodrine  10 mg Oral TID WC   pantoprazole (PROTONIX) IV  40 mg Intravenous Q12H   Ensure Max Protein  11 oz Oral BID   vitamin B-12  500 mcg Oral Daily    Continuous Inpatient Infusions:    sodium chloride Stopped (08/05/21 1441)   albumin human 12.5 g (08/06/21 0817)   phenylephrine (NEO-SYNEPHRINE) Adult infusion 70 mcg/min (08/06/21 1203)    PRN Inpatient Medications:  acetaminophen, lidocaine HCl (PF), lip balm, polyethylene glycol  Review of Systems: Constitutional: Weight is stable.  Eyes: No changes in vision. ENT: No oral lesions, sore throat.  GI: see HPI.  Heme/Lymph: No easy bruising.  CV: No chest pain.  GU: No hematuria.  Integumentary: No rashes.  Neuro: No headaches.  Psych: No depression/anxiety.  Endocrine: No heat/cold intolerance.  Allergic/Immunologic: No urticaria.  Resp: No cough, SOB.  Musculoskeletal: No joint swelling.    Physical Examination: BP (!) 94/56   Pulse 85   Temp 98.1 F (36.7 C) (Oral)   Resp 20   Ht 5\' 2"  (1.575 m)   Wt 102 kg   SpO2 100%   BMI 41.13 kg/m  Gen: NAD, alert and oriented x 4 HEENT: PEERLA, EOMI, Neck: supple, no JVD or thyromegaly Chest: CTA bilaterally, no wheezes, crackles, or other adventitious sounds CV: irregular irregular, no m/g/c/r Abd: soft, NT, ND, +BS in all four quadrants; no  HSM, guarding, ridigity, or rebound tenderness Ext: anasarca Skin: no rash or lesions noted Lymph: no LAD  Data: Lab Results  Component Value Date   WBC 14.6 (H) 08/06/2021   HGB 10.3 (L) 08/06/2021   HCT 31.9 (L) 08/06/2021   MCV 86.2 08/06/2021   PLT 227 08/06/2021   Recent Labs  Lab 08/05/21 1706 08/05/21 2308 08/06/21 0510  HGB 10.3* 9.9* 10.3*   Lab Results  Component Value Date   NA 143 08/06/2021   K 3.4 (L) 08/06/2021   CL 104 08/06/2021   CO2 28 08/06/2021   BUN 67 (H) 08/06/2021   CREATININE 1.43 (H) 08/06/2021   Lab Results  Component Value Date   ALT 15 08/06/2021   AST 21 08/06/2021   ALKPHOS 40 08/06/2021   BILITOT 2.6 (H) 08/06/2021   Recent Labs  Lab 08/05/21 0444  INR 1.4*   Assessment/Plan:  GI bleeding - Presumed lower. Currently stable after transfusion, Eliquis held. No further rectal bleeding. No overt GIB.  Hypotensive shock - On pressors, improving, being weaned. Pericardial effusion s/p pericardiocentesis - Dr. Saunders Revel of Cardiology following patient.  Permanent atrial fibrillation - rate controlled, holding Eliquis  Recommendations:  - Continue to monitor H&H closely. Transfuse for Hgb <7.0.  - Appreciate Cardiology assistance in managing pericardial effusion - No indication for urgent luminal evaluation since no overt GIB and H&H stable - Continue optimization of hemodynamics - Consider luminal evaluation  once cardiovascular status is optimized - Following  Please call with questions or concerns.    Octavia Bruckner, PA-C [facility] [phone] (Cell)

## 2021-08-06 NOTE — Progress Notes (Signed)
*  PRELIMINARY RESULTS* Echocardiogram 2D Echocardiogram has been performed.  Karen Farley 08/06/2021, 9:26 AM

## 2021-08-06 NOTE — Progress Notes (Signed)
[facility]  ROUNDING NOTE   Subjective:   [name] was admitted to [facility] on 07/26/2021 for Morbid obesity ([place]) [E66.01] DOE (dyspnea on exertion) [R06.09] Recurrent left pleural effusion [J90] Hypotension [I95.9] Type 2 diabetes mellitus without complication, without long-term current use of insulin ([place]) [E11.9] Atrial fibrillation, unspecified type ([place]) [I48.91] Chronic congestive heart failure, unspecified heart failure type [facility]) [I50.9]  Patient was last seen by my partner, Dr. Candiss Norse, on 05/31/21. Where torsemide was restarted and to continue metolazone.   Patient admitted with shortness of breath and increasing peripheral edema. She was then started on IV furosemide. Left thoracentesis done on 07/28/21  662mL of pleural fluid.   Patient feels better today Denies shortness of breath or further rectal bleeding ECHO in progress to access pericardial effusion Daily weight increasing Hypotensive   Objective:  Vital signs in last 24 hours:  Temp:  [97.5 F (36.4 C)-98 F (36.7 C)] 98 F (36.7 C) (10/17 0300) Pulse Rate:  [60-97] 85 (10/17 0715) Resp:  [11-29] 22 (10/17 0715) BP: (71-113)/(31-76) 100/61 (10/17 0700) SpO2:  [94 %-100 %] 100 % (10/17 0715) Weight:  [102 kg] 102 kg (10/17 0253)  Weight change: 0.3 kg Filed Weights   08/04/21 2020 08/05/21 0413 08/06/21 0253  Weight: 101.7 kg 101.3 kg 102 kg    Intake/Output: I/O last 3 completed shifts: In: 4305.5 [P.O.:60; I.V.:1353.6; Blood:800; IV Piggyback:2091.9] Out: 1950 [ZJIRC:7893]   Intake/Output this shift:  No intake/output data recorded.  Physical Exam: General: NAD, resting in bed  Head: Normocephalic, atraumatic. Moist oral mucosal membranes  Eyes: Anicteric  Lungs:  Clear bilaterally, normal effort  Heart: Regular rate and rhythm  Abdomen:  Soft, nontender  Extremities:  ++ peripheral edema.  Neurologic: Nonfocal, moving all four extremities  Skin: Bilateral lower  extremity erythema and fluid filled blisters, bandaged        Basic Metabolic Panel: Recent Labs  Lab 08/02/21 0432 08/03/21 0454 08/04/21 0504 08/05/21 0444 08/06/21 0510  NA 131* 135 136 140 143  K 4.8 4.1 3.8 4.0 3.4*  CL 93* 95* 93* 100 104  CO2 27 29 31 30 28   GLUCOSE 253* 280* 214* 244* 148*  BUN 57* 60* 62* 87* 67*  CREATININE 1.84* 1.75* 1.75* 1.55* 1.43*  CALCIUM 9.2 9.2 8.9 8.9 9.3  MG 2.4 2.7* 2.2 2.1 2.2  PHOS 3.5 3.3 2.7 3.7 3.5     Liver Function Tests: Recent Labs  Lab 08/06/21 0510  AST 21  ALT 15  ALKPHOS 40  BILITOT 2.6*  PROT 5.3*  ALBUMIN 3.6    No results for input(s): LIPASE, AMYLASE in the last 168 hours. No results for input(s): AMMONIA in the last 168 hours.  CBC: Recent Labs  Lab 08/01/21 0434 08/02/21 0432 08/03/21 0454 08/04/21 0504 08/04/21 1233 08/04/21 1845 08/05/21 0444 08/05/21 1106 08/05/21 1706 08/05/21 2308 08/06/21 0510  WBC 11.3*   < > 8.1 9.0 10.9*  --  13.1*  --   --   --  14.6*  NEUTROABS 9.9*  --   --   --   --   --   --   --   --   --  10.8*  HGB 13.6   < > 13.3 12.4 9.7*   < > 11.2* 10.3* 10.3* 9.9* 10.3*  HCT 41.9   < > 41.0 38.4 30.0*   < > 32.8* 29.9* 31.4* 29.4* 31.9*  MCV 75.9*   < > 75.1* 74.9* 75.9*  --  80.2  --   --   --  86.2  PLT 398   < > 332 311 288  --  244  --   --   --  227   < > = values in this interval not displayed.     Cardiac Enzymes: No results for input(s): CKTOTAL, CKMB, CKMBINDEX, TROPONINI in the last 168 hours.  BNP: Invalid input(s): POCBNP  CBG: Recent Labs  Lab 08/05/21 0728 08/05/21 1159 08/05/21 1559 08/05/21 2109 08/06/21 0759  GLUCAP 222* 177* 184* 157* 131*     Microbiology: Results for orders placed or performed during the hospital encounter of 07/26/21  Resp Panel by RT-PCR (Flu A&B, Covid) Nasopharyngeal Swab     Status: None   Collection Time: 07/26/21  7:19 PM   Specimen: Nasopharyngeal Swab; Nasopharyngeal(NP) swabs in vial transport medium   Result Value Ref Range Status   SARS Coronavirus 2 by RT PCR NEGATIVE NEGATIVE Final    Comment: (NOTE) SARS-CoV-2 target nucleic acids are NOT DETECTED.  The SARS-CoV-2 RNA is generally detectable in upper respiratory specimens during the acute phase of infection. The lowest concentration of SARS-CoV-2 viral copies this assay can detect is 138 copies/mL. A negative result does not preclude SARS-Cov-2 infection and should not be used as the sole basis for treatment or other patient management decisions. A negative result may occur with  improper specimen collection/handling, submission of specimen other than nasopharyngeal swab, presence of viral mutation(s) within the areas targeted by this assay, and inadequate number of viral copies(<138 copies/mL). A negative result must be combined with clinical observations, patient history, and epidemiological information. The expected result is Negative.  Fact Sheet for Patients:  EntrepreneurPulse.com.au  Fact Sheet for Healthcare Providers:  IncredibleEmployment.be  This test is no t yet approved or cleared by the [place] FDA and  has been authorized for detection and/or diagnosis of SARS-CoV-2 by FDA under an Emergency Use Authorization (EUA). This EUA will remain  in effect (meaning this test can be used) for the duration of the COVID-19 declaration under Section 564(b)(1) of the Act, 21 U.S.C.section 360bbb-3(b)(1), unless the authorization is terminated  or revoked sooner.       Influenza A by PCR NEGATIVE NEGATIVE Final   Influenza B by PCR NEGATIVE NEGATIVE Final    Comment: (NOTE) The Xpert Xpress SARS-CoV-2/FLU/RSV plus assay is intended as an aid in the diagnosis of influenza from Nasopharyngeal swab specimens and should not be used as a sole basis for treatment. Nasal washings and aspirates are unacceptable for Xpert Xpress SARS-CoV-2/FLU/RSV testing.  Fact Sheet for  Patients: EntrepreneurPulse.com.au  Fact Sheet for Healthcare Providers: IncredibleEmployment.be  This test is not yet approved or cleared by the [place] FDA and has been authorized for detection and/or diagnosis of SARS-CoV-2 by FDA under an Emergency Use Authorization (EUA). This EUA will remain in effect (meaning this test can be used) for the duration of the COVID-19 declaration under Section 564(b)(1) of the Act, 21 U.S.C. section 360bbb-3(b)(1), unless the authorization is terminated or revoked.  Performed at [facility], [address] [place], [place] [address]   Body fluid culture w Gram Stain     Status: None   Collection Time: 07/28/21  1:53 PM   Specimen: PATH Cytology Pleural fluid  Result Value Ref Range Status   Specimen Description   Final    PLEURAL Performed at [facility], [address], [place], [place] [address]    Special Requests  Final    NONE Performed at [facility], [place], [place] [address]    Gram Stain   Final    NO SQUAMOUS EPITHELIAL CELLS SEEN FEW WBC SEEN NO ORGANISMS SEEN    Culture   Final    NO GROWTH 3 DAYS Performed at [facility], [place] [address], [place], [place] [address]    Report Status 08/01/2021 FINAL  Final  Body fluid culture w Gram Stain     Status: None   Collection Time: 08/01/21  3:36 PM   Specimen: PATH Cytology Peritoneal fluid  Result Value Ref Range Status   Specimen Description   Final    PERITONEAL Performed at [facility], [address] [place], [place] [address]    Special Requests   Final    NONE Performed at [facility], [address] [place], [place] [address]    Gram Stain   Final    RARE WBC PRESENT,BOTH PMN AND MONONUCLEAR NO ORGANISMS SEEN    Culture   Final    NO GROWTH 3 DAYS Performed at [facility], [place] [address], [place], [place] [address]    Report Status  08/05/2021 FINAL  Final  MRSA Next Gen by PCR, Nasal     Status: None   Collection Time: 08/04/21  8:56 PM   Specimen: Nasal Mucosa; Nasal Swab  Result Value Ref Range Status   MRSA by PCR Next Gen NOT DETECTED NOT DETECTED Final    Comment: (NOTE) The GeneXpert MRSA Assay (FDA approved for NASAL specimens only), is one component of a comprehensive MRSA colonization surveillance program. It is not intended to diagnose MRSA infection nor to guide or monitor treatment for MRSA infections. Test performance is not FDA approved in patients less than 15 years old. Performed at [facility], [address] [place], [place] [address]   Body fluid culture w Gram Stain     Status: None (Preliminary result)   Collection Time: 08/05/21  5:50 PM   Specimen: Pericardial  Result Value Ref Range Status   Specimen Description   Final    PERICARDIAL Performed at [facility], [address] [place], [place] [address]    Special Requests   Final    NONE Performed at [facility], [address] [place], [place] [address]    Gram Stain   Final    FEW WBC PRESENT, PREDOMINANTLY MONONUCLEAR NO ORGANISMS SEEN Performed at [facility], [place] [address] [place],  [address]    Culture PENDING  Incomplete   Report Status PENDING  Incomplete    Coagulation Studies: Recent Labs    08/05/21 0444  LABPROT 17.2*  INR 1.4*     Urinalysis: No results for input(s): COLORURINE, LABSPEC, PHURINE, GLUCOSEU, HGBUR, BILIRUBINUR, KETONESUR, PROTEINUR, UROBILINOGEN, NITRITE, LEUKOCYTESUR in the last 72 hours.  Invalid input(s): APPERANCEUR    Imaging: CARDIAC CATHETERIZATION  Result Date: 08/05/2021 Successful pericardiocentesis with placement of 6 French pigtail catheter into pericardial space with removal of 700 cc of bloody pericardial fluid   DG Chest Port 1 View  Result Date: 08/05/2021 CLINICAL DATA:  Acute onset of shortness of breath, initial  encounter EXAM: PORTABLE CHEST 1 VIEW COMPARISON:  CT from the previous day. FINDINGS: Cardiac shadow is prominent. Pericardial drainage catheter is now seen. Left-sided pleural effusion is noted increased when compare with the prior exam with associated left basilar atelectasis. Right lung remains clear. No bony  abnormality is noted. IMPRESSION: Pericardial catheter in place. Enlarging left-sided effusion. Electronically Signed   By: Inez Catalina M.D.   On: 08/05/2021 20:01   ECHOCARDIOGRAM COMPLETE  Result Date: 08/05/2021    ECHOCARDIOGRAM REPORT   Patient Name:   Karen Farley Date of Exam: 08/05/2021 Medical Rec #:  [id number]          Height:       62.0 in Accession #:    [id number]         Weight:       223.3 lb Date of Birth:  [birth year]           BSA:          2.003 m Patient Age:    75 years           BP:           89/53 mmHg Patient Gender: F                  HR:           107 bpm. Exam Location:  [facility] Procedure: 2D Echo, Cardiac Doppler and Color Doppler Indications:     Pericardial effusion I31.3  History:         Patient has prior history of Echocardiogram examinations. CHF;                  Risk Factors:Hypertension and Diabetes.  Sonographer:     Alyse Low Roar Referring Phys:  [id number] BRITTON L RUST-CHESTER Diagnosing Phys: Ida Rogue MD IMPRESSIONS  1. Left ventricular ejection fraction, by estimation, is 60 to 65%. The left ventricle has normal function. The left ventricle has no regional wall motion abnormalities. There is mild left ventricular hypertrophy. Left ventricular diastolic parameters are indeterminate.  2. Right ventricular systolic function is normal. The right ventricular size is normal. There is moderately elevated pulmonary artery systolic pressure. The estimated right ventricular systolic pressure is 56.4 mmHg.  3. Left atrial size was mildly dilated.  4. The mitral valve is normal in structure. Mild mitral valve regurgitation.  5. The aortic valve was not well  visualized,Calcification noted. Aortic valve regurgitation is mild. Mild to moderate aortic valve sclerosis/calcification is present, unable to exclude mild aortic valve regurgitation.  6. The inferior vena cava is dilated in size with <50% respiratory variability, suggesting right atrial pressure of 15 mmHg.  7. A large pericardial effusion is present. 2.74 to 3.39 cm circumferential, no significant RA or RV collapse. Mitral and tricuspid valve in-flow velocities not measured. Grossly, there does not appear to be tamponade. Of concern is the dilated IVC with  little respiratory variation and moderately elevated right heart pressures. FINDINGS  Left Ventricle: Left ventricular ejection fraction, by estimation, is 60 to 65%. The left ventricle has normal function. The left ventricle has no regional wall motion abnormalities. The left ventricular internal cavity size was normal in size. There is  mild left ventricular hypertrophy. Left ventricular diastolic parameters are indeterminate. Right Ventricle: The right ventricular size is normal. No increase in right ventricular wall thickness. Right ventricular systolic function is normal. There is moderately elevated pulmonary artery systolic pressure. The tricuspid regurgitant velocity is 3.14 m/s, and with an assumed right atrial pressure of 15 mmHg, the estimated right ventricular systolic pressure is 33.2 mmHg. Left Atrium: Left atrial size was mildly dilated. Right Atrium: Right atrial size was normal in size. Pericardium: A large pericardial effusion is present. Mitral Valve: The mitral valve is normal  in structure. Mild mitral valve regurgitation. No evidence of mitral valve stenosis. Tricuspid Valve: The tricuspid valve is normal in structure. Tricuspid valve regurgitation is mild . No evidence of tricuspid stenosis. Aortic Valve: The aortic valve was not well visualized. Aortic valve regurgitation is mild. Mild to moderate aortic valve sclerosis/calcification is  present, without any evidence of aortic stenosis. Aortic valve mean gradient measures 13.0 mmHg. Aortic valve peak gradient measures 26.0 mmHg. Aortic valve area, by VTI measures 0.81 cm. Pulmonic Valve: The pulmonic valve was normal in structure. Pulmonic valve regurgitation is not visualized. No evidence of pulmonic stenosis. Aorta: The aortic root is normal in size and structure. Venous: The inferior vena cava is dilated in size with less than 50% respiratory variability, suggesting right atrial pressure of 15 mmHg. IAS/Shunts: No atrial level shunt detected by color flow Doppler.  LEFT VENTRICLE PLAX 2D LVIDd:         3.09 cm   Diastology LVIDs:         2.12 cm   LV e' medial:    7.51 cm/s LV PW:         0.78 cm   LV E/e' medial:  17.0 LV IVS:        1.02 cm   LV e' lateral:   6.80 cm/s LVOT diam:     1.70 cm   LV E/e' lateral: 18.8 LV SV:         33 LV SV Index:   17 LVOT Area:     2.27 cm  RIGHT VENTRICLE RV Mid diam:    2.15 cm RV S prime:     9.96 cm/s LEFT ATRIUM             Index LA diam:        4.37 cm 2.18 cm/m LA Vol (A2C):   27.9 ml 13.93 ml/m LA Vol (A4C):   29.0 ml 14.48 ml/m LA Biplane Vol: 30.8 ml 15.37 ml/m  AORTIC VALVE                     PULMONIC VALVE AV Area (Vmax):    0.93 cm      PV Vmax:        0.83 m/s AV Area (Vmean):   0.91 cm      PV Peak grad:   2.7 mmHg AV Area (VTI):     0.81 cm      RVOT Peak grad: 2 mmHg AV Vmax:           255.00 cm/s AV Vmean:          162.000 cm/s AV VTI:            0.410 m AV Peak Grad:      26.0 mmHg AV Mean Grad:      13.0 mmHg LVOT Vmax:         105.00 cm/s LVOT Vmean:        65.000 cm/s LVOT VTI:          0.147 m LVOT/AV VTI ratio: 0.36  AORTA Ao Root diam: 2.50 cm MITRAL VALVE                TRICUSPID VALVE MV Area (PHT): 3.83 cm     TR Peak grad:   39.4 mmHg MV Decel Time: 198 msec     TR Vmax:        314.00 cm/s MV E velocity: 128.00 cm/s  SHUNTS                             Systemic VTI:  0.15 m                              Systemic Diam: 1.70 cm Ida Rogue MD Electronically signed by Ida Rogue MD Signature Date/Time: 08/05/2021/3:49:45 PM    Final    CT ANGIO GI BLEED  Result Date: 08/04/2021 CLINICAL DATA:  Painless bloody stools with down trending hemoglobin, initial encounter EXAM: CTA ABDOMEN AND PELVIS WITHOUT AND WITH CONTRAST TECHNIQUE: Multidetector CT imaging of the abdomen and pelvis was performed using the standard protocol during bolus administration of intravenous contrast. Multiplanar reconstructed images and MIPs were obtained and reviewed to evaluate the vascular anatomy. CONTRAST:  106mL OMNIPAQUE IOHEXOL 350 MG/ML SOLN COMPARISON:  None. FINDINGS: VASCULAR Aorta: Atherosclerotic calcifications of the abdominal aorta are noted. No aneurysmal dilatation or dissection is seen. The aorta is somewhat diminutive Celiac: Atherosclerotic calcifications are noted at the origin with approximately 50% stenosis and mild poststenotic dilatation in the celiac. SMA: Atherosclerotic calcifications are noted with high-grade stenosis although no significant poststenotic dilatation is seen. Renals: Single renal arteries are identified bilaterally. High-grade stenosis is noted in the origin of the renal arteries bilaterally with somewhat diminutive for renal artery branches likely related to the hypotensive state. IMA: Patent without evidence of aneurysm, dissection, vasculitis or significant stenosis. Inflow: Iliacs demonstrate atherosclerotic calcification without focal high-grade stenosis. Veins: No specific venous abnormality is noted. Review of the MIP images confirms the above findings. NON-VASCULAR Lower chest: Lung bases demonstrate evidence of left-sided pleural effusion with left lower lobe consolidation. Large pericardial effusion is noted Hepatobiliary: Liver is fatty infiltrated. Some mild nodularity is noted suggestive of underlying cirrhosis. Mild perihepatic ascites is seen. The gallbladder has been  surgically removed. Pancreas: Unremarkable. No pancreatic ductal dilatation or surrounding inflammatory changes. Spleen: Normal in size without focal abnormality. Adrenals/Urinary Tract: Adrenal glands appear within normal limits. Kidneys demonstrate a normal enhancement pattern bilaterally. No obstructive changes are seen. The bladder is partially distended. Stomach/Bowel: In the rectum on the arterial phase images there is a focal area of contrast extravasation identified best seen on image number 225 of series 5 and image number 135 and 134 of series 10. Subsequent venous phase images show this to be more diffusely distributed within the rectum consistent with focal GI bleeding. No other area of contrast extravasation is seen. No other pooling of contrast is noted within the colon. Scattered diverticular change is seen without evidence of diverticulitis. Small bowel and stomach appear within normal limits. Lymphatic: No significant lymphadenopathy is noted. Reproductive: Uterus demonstrates multiple calcified uterine fibroids. No definitive adnexal mass is seen. Other: Mild ascites is noted surrounding the liver and spleen. Mild changes of anasarca are noted Musculoskeletal: Degenerative changes of lumbar spine and hip joints are noted. No acute bony abnormality is seen. IMPRESSION: VASCULAR Changes consistent with active GI hemorrhage within the distal rectum as described. No other focal area of acute hemorrhage is seen. 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 Mild ascites is noted as well. Left-sided pleural effusion with lower lobe consolidation. Large pericardial effusion. Diverticulosis without diverticulitis. Uterine fibroid change. Aortic Atherosclerosis (ICD10-I70.0). Critical Value/emergent results were called by telephone at the time of interpretation  on 08/04/2021 at  11:27 pm to [facility], NP , who verbally acknowledged these results. Electronically Signed   By: Inez Catalina M.D.   On: 08/04/2021 23:31     Medications:    sodium chloride Stopped (08/05/21 1441)   albumin human 12.5 g (08/06/21 0817)   iron sucrose Stopped (08/05/21 1256)   phenylephrine (NEO-SYNEPHRINE) Adult infusion 80 mcg/min (08/06/21 0826)    Chlorhexidine Gluconate Cloth  6 each Topical Daily   folic acid  1 mg Oral Daily   insulin aspart  0-5 Units Subcutaneous QHS   insulin aspart  0-9 Units Subcutaneous TID WC   latanoprost  1 drop Both Eyes QHS   midodrine  10 mg Oral TID WC   pantoprazole (PROTONIX) IV  40 mg Intravenous Q12H   Ensure Max Protein  11 oz Oral BID   vitamin B-12  500 mcg Oral Daily     Assessment/ Plan:  [name] is a 75 y.o. white female with diastolic congestive heart failure, hypertension, hyperlipidemia, lymphedema, atrial fibrillation, peripheral vascular disease who is admitted to [facility] on 07/26/2021 for Morbid obesity ([place]) [E66.01] DOE (dyspnea on exertion) [R06.09] Recurrent left pleural effusion [J90] Hypotension [I95.9] Type 2 diabetes mellitus without complication, without long-term current use of insulin ([facility]) [E11.9] Atrial fibrillation, unspecified type ([place]) [I48.91] Chronic congestive heart failure, unspecified heart failure type ([facility]) [I50.9]  Acute kidney injury on chronic kidney disease stage IIIB: with baseline creatinine of 1.66, GFR of 36 on 07/13/21. History of bland urine. 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 Hold benazepril and spironolactone.  Attempted IV lasix without sustained success.Daily weights indicate steady increase in weight daily. Transitioned to Lasix drip at 6mg /hr.with Albumin twice daily.  -Creatinine at baseline - Diuretics held, will resume once medically stable  Hypotension: BP remains soft at 88/57 currently on phenylephrine for BP support  Acute  exacerbation of diastolic congestive heart failure:  - Cardiology following - Will defer diuretic restart to cardiology -Fluid restriction remains in place  Hyponatremia: with hypervolumia.   -Resolved - Continue fluid restriction.   5. Left pleural effusion: Thoracentesis on 07/28/21 with 631ml removed. Paracentesis on 08/01/21 with 1.2L removed. Solucortef ordered. Pulmonology following  6. Pericardial effusion- Pericardiocentesis performed on 08/05/21 with 1.1L removed.   LOS: [place] 10/17/202210:10 AM

## 2021-08-06 NOTE — Progress Notes (Signed)
PROGRESS NOTE    [name]  MHD:[id number] DOB: 1946/03/05 DOA: 07/26/2021 PCP: Valerie Roys, DO   Brief Narrative:  Karen Farley is a 75 y.o. female with medical history significant for HFpEF, permanent atrial fibrillation on Eliquis, PAD, type 2 diabetes, CKD stage IIIb, lymphedema presented to hospital with increasing shortness of breath and edema.  Patient has been noticing increasing abdominal distention since her admission in August with severe lower extremity edema and dyspnea and exertion.  She had a visit with her PCP who had indicated recurrence of her left-sided pleural effusion and was in process of being refer to pulmonary for thoracocentesis.  Patient does have history of congestive heart failure and was admitted in July August for the same and was seen by cardiology and nephrology at that time with removal of 400 cc of pleural fluid at that time.  Patient had followed up with nephrology subsequently and was on torsemide as outpatient.  In the ED on initial presentation patient was hypotensive with blood pressure of 84/62 and received a normal saline bolus.  She was hyponatremic with sodium of 128, potassium of 2.6 and creatinine of 1.7 f.  Chest x-ray showed a large left-sided pleural effusion.  Patient was then admitted to hospital for further evaluation and treatment.  Assessment & Plan:   Principal Problem:   Acute blood loss anemia Active Problems:   Type 2 diabetes mellitus with renal complication (HCC)   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 diabetes mellitus (HCC)   Atrial fibrillation (HCC)   Pleural effusion   Acute on chronic diastolic CHF (congestive heart failure) (HCC)   Chronic anticoagulation   Shock circulatory (HCC)   Pericardial effusion   GI bleed with hemorrhagic shock. Patient was noted to have significant drop in hemoglobin to 9.7 from 12.4 and was transferred to the ICU.  GI following the patient at this time.  No further bowel movements reported.   Unable to do any GI intervention due to patient being hemodynamically unstable on vasopressors with large pericardial effusion.  Large pericardial effusion Patient underwent a pericardiocentesis on 08/05/2021 with removal of 700 mL of pleural fluid.  Inflammatory markers were unremarkable.  Cardiology monitoring the patient while in the hospital.  Still on vasopressors due to hypotension.    Left pleural effusion secondary to acute exacerbation of chronic diastolic CHF tatus post thoracocentesis with removal of 600 mL of fluid on 07/28/2021.  Diuretic on hold at this time.    Hypotension Secondary to hemorrhagic shock Antihypertensives on hold.  Still on phenylephrine drip and overall midodrine.  Albumin has been added today.  Had pericardiocentesis but despite that still hypotensive.  Hypokalemia Mild.  We will continue to replenish.  Check levels in a.m.   Hyponatremia,  Improved at this time.   Acute kidney injury on CKD stage 3b Patient's baseline creatinine around 1.4-1.7.  Currently at baseline.  Nephrology followed the patient during hospitalization.  Benazepril and spironolactone on hold.  Permanent atrial fibrillation Was previously on Eliquis.  Currently on hold due to GI bleed.  Rate controlled at this time.   Type 2 diabetes mellitus Diet controlled at home.  Continue sliding scale insulin while in the hospital.   Iron deficiency, patient received Venofer 200 mg IV daily for 5 days.  We will transition to oral iron  Vitamin B12 level 284, target >400, on vitamin B12 supplements.  Folic acid level 8.3.  On the lower range.  On folic acid  DVT  prophylaxis: SCD/Compression stockings   Code Status: Full code   Family Communication:  Spoke with the patient's husband at bedside.  Disposition Plan:     Undetermined at this time.  Still in ICU.    Consults called:  Gastroenterology, cardiology, nephrology, critical care  Admission status: Inpatient Patient is on  vasopressors in the ICU status post pericardiocentesis.  Procedures:  Thoracocentesis on 07/28/2021 with removal of 600 mL fluid. CTA of the abdomen Pericardiocentesis.  Subjective: Today, patient was seen and examined at bedside.  Patient still remains hypotensive on vasopressors despite pericardiocentesis.  She however feels little better with breathing.  Denies any chest pain cough or fever.   Objective: Vitals:   08/06/21 1445 08/06/21 1500 08/06/21 1515 08/06/21 1530  BP: (!) 89/55 (!) 84/64 103/61 (!) 84/50  Pulse: 79 91 94 80  Resp: (!) 21 (!) 23 20 (!) 23  Temp:      TempSrc:      SpO2: 99% 100% 99% 99%  Weight:      Height:        Intake/Output Summary (Last 24 hours) at 08/06/2021 1601 Last data filed at 08/06/2021 1300 Gross per 24 hour  Intake 1828.3 ml  Output 950 ml  Net 878.3 ml    Filed Weights   08/04/21 2020 08/05/21 0413 08/06/21 0253  Weight: 101.7 kg 101.3 kg 102 kg    Physical examination: General:  Average built, not in obvious distress, on nasal cannula oxygen HENT:   No scleral pallor or icterus noted. Oral mucosa is moist.  Chest:  .  Diminished breath sounds bilaterally.  CVS: S1 &S2 heard. No murmur.  Regular rate and rhythm. Abdomen: Soft, nontender, nondistended.  Bowel sounds are heard.   Extremities: No cyanosis, clubbing but with bilateral lower extremity fluid-filled blisters with lower extremity wraps.  Peripheral edema noted. Psych: Alert, awake and oriented, normal mood CNS:  No cranial nerve deficits.  Power equal in all extremities.   Skin: Warm and dry.  No rashes noted.  Data Reviewed: I have personally reviewed the following labs and imaging studies.    CBC: Recent Labs  Lab 08/01/21 0434 08/02/21 0432 08/03/21 0454 08/04/21 0504 08/04/21 1233 08/04/21 1845 08/05/21 0444 08/05/21 1106 08/05/21 1706 08/05/21 2308 08/06/21 0510  WBC 11.3*   < > 8.1 9.0 10.9*  --  13.1*  --   --   --  14.6*  NEUTROABS 9.9*  --   --    --   --   --   --   --   --   --  10.8*  HGB 13.6   < > 13.3 12.4 9.7*   < > 11.2* 10.3* 10.3* 9.9* 10.3*  HCT 41.9   < > 41.0 38.4 30.0*   < > 32.8* 29.9* 31.4* 29.4* 31.9*  MCV 75.9*   < > 75.1* 74.9* 75.9*  --  80.2  --   --   --  86.2  PLT 398   < > 332 311 288  --  244  --   --   --  227   < > = values in this interval not displayed.    Basic Metabolic Panel: Recent Labs  Lab 08/02/21 0432 08/03/21 0454 08/04/21 0504 08/05/21 0444 08/06/21 0510  NA 131* 135 136 140 143  K 4.8 4.1 3.8 4.0 3.4*  CL 93* 95* 93* 100 104  CO2 27 29 31 30 28   GLUCOSE 253* 280* 214* 244* 148*  BUN 57*  60* 62* 87* 67*  CREATININE 1.84* 1.75* 1.75* 1.55* 1.43*  CALCIUM 9.2 9.2 8.9 8.9 9.3  MG 2.4 2.7* 2.2 2.1 2.2  PHOS 3.5 3.3 2.7 3.7 3.5    GFR: Estimated Creatinine Clearance: 38 mL/min (A) (by C-G formula based on SCr of 1.43 mg/dL (H)). Liver Function Tests: Recent Labs  Lab 08/06/21 0510  AST 21  ALT 15  ALKPHOS 40  BILITOT 2.6*  PROT 5.3*  ALBUMIN 3.6   No results for input(s): LIPASE, AMYLASE in the last 168 hours. No results for input(s): AMMONIA in the last 168 hours. Coagulation Profile: Recent Labs  Lab 08/05/21 0444  INR 1.4*    Cardiac Enzymes: No results for input(s): CKTOTAL, CKMB, CKMBINDEX, TROPONINI in the last 168 hours. BNP (last 3 results) No results for input(s): PROBNP in the last 8760 hours. HbA1C: No results for input(s): HGBA1C in the last 72 hours. CBG: Recent Labs  Lab 08/05/21 1159 08/05/21 1559 08/05/21 2109 08/06/21 0759 08/06/21 1125  GLUCAP 177* 184* 157* 131* 149*    Lipid Profile: No results for input(s): CHOL, HDL, LDLCALC, TRIG, CHOLHDL, LDLDIRECT in the last 72 hours. Thyroid Function Tests: No results for input(s): TSH, T4TOTAL, FREET4, T3FREE, THYROIDAB in the last 72 hours. Anemia Panel: No results for input(s): VITAMINB12, FOLATE, FERRITIN, TIBC, IRON, RETICCTPCT in the last 72 hours. Sepsis Labs: No results for input(s):  PROCALCITON, LATICACIDVEN in the last 168 hours.  Recent Results (from the past 240 hour(s))  Body fluid culture w Gram Stain     Status: None   Collection Time: 07/28/21  1:53 PM   Specimen: PATH Cytology Pleural fluid  Result Value Ref Range Status   Specimen Description   Final    PLEURAL Performed at [facility], [address] [place], [place] [address]    Special Requests   Final    NONE Performed at [facility], [address] [place], [place] [address]    Gram Stain   Final    NO SQUAMOUS EPITHELIAL CELLS SEEN FEW WBC SEEN NO ORGANISMS SEEN    Culture   Final    NO GROWTH 3 DAYS Performed at [facility], [place] [address] [place], [place] [address]    Report Status 08/01/2021 FINAL  Final  Body fluid culture w Gram Stain     Status: None   Collection Time: 08/01/21  3:36 PM   Specimen: PATH Cytology Peritoneal fluid  Result Value Ref Range Status   Specimen Description   Final    PERITONEAL Performed at [facility], [address], [place], [place] [address]    Special Requests   Final    NONE Performed at [facility], [address] [place], [place] [address]    Gram Stain   Final    RARE WBC PRESENT,BOTH PMN AND MONONUCLEAR NO ORGANISMS SEEN    Culture   Final    NO GROWTH 3 DAYS Performed at [facility], [place] [address], [place], [place] [address]    Report Status 08/05/2021 FINAL  Final  MRSA Next Gen by PCR, Nasal     Status: None   Collection Time: 08/04/21  8:56 PM   Specimen: Nasal Mucosa; Nasal Swab  Result Value Ref Range Status   MRSA by PCR Next Gen NOT DETECTED NOT DETECTED Final    Comment: (NOTE) The GeneXpert MRSA Assay (FDA approved for NASAL specimens only), is one component of a comprehensive MRSA colonization surveillance  program. It is not intended to diagnose MRSA infection nor to guide or monitor treatment for MRSA infections. Test performance is not FDA approved in  patients less than 14 years old. Performed at [facility] [address] [place], [place] [address]   Body fluid culture w Gram Stain     Status: None (Preliminary result)   Collection Time: 08/05/21  5:50 PM   Specimen: Pericardial  Result Value Ref Range Status   Specimen Description   Final    PERICARDIAL Performed at [facility], [address], [place], [place] [address]    Special Requests   Final    NONE Performed at [facility], [address] [place], [place] [address]    Gram Stain   Final    FEW WBC PRESENT, PREDOMINANTLY MONONUCLEAR NO ORGANISMS SEEN    Culture   Final    NO GROWTH < 12 HOURS Performed at [facility], [place] [address], [place],  [address]    Report Status PENDING  Incomplete      Radiology Studies: CARDIAC CATHETERIZATION  Result Date: 08/05/2021 Successful pericardiocentesis with placement of 6 French pigtail catheter into pericardial space with removal of 700 cc of bloody pericardial fluid   DG Chest Port 1 View  Result Date: 08/05/2021 CLINICAL DATA:  Acute onset of shortness of breath, initial encounter EXAM: PORTABLE CHEST 1 VIEW COMPARISON:  CT from the previous day. FINDINGS: Cardiac shadow is prominent. Pericardial drainage catheter is now seen. Left-sided pleural effusion is noted increased when compare with the prior exam with associated left basilar atelectasis. Right lung remains clear. No bony abnormality is noted. IMPRESSION: Pericardial catheter in place. Enlarging left-sided effusion. Electronically Signed   By: Inez Catalina M.D.   On: 08/05/2021 20:01   ECHOCARDIOGRAM COMPLETE  Result Date: 08/05/2021    ECHOCARDIOGRAM REPORT   Patient Name:   Karen Farley Date of Exam: 08/05/2021 Medical Rec #:  [id number]          Height:       62.0 in Accession #:    [id number]         Weight:       223.3 lb Date of Birth:  Jul 23, 1946           BSA:          2.003 m Patient Age:    27 years            BP:           89/53 mmHg Patient Gender: F                  HR:           107 bpm. Exam Location:  [facility] Procedure: 2D Echo, Cardiac Doppler and Color Doppler Indications:     Pericardial effusion I31.3  History:         Patient has prior history of Echocardiogram examinations. CHF;                  Risk Factors:Hypertension and Diabetes.  Sonographer:     Alyse Low Roar Referring Phys:  [id number] BRITTON L RUST[place]CHESTER Diagnosing Phys: Ida Rogue MD IMPRESSIONS  1. Left ventricular ejection fraction, by estimation, is 60 to 65%. The left ventricle has normal function. The left ventricle has no regional wall motion abnormalities. There is mild left ventricular hypertrophy. Left ventricular diastolic parameters are indeterminate.  2. Right ventricular systolic function is normal. The right ventricular  size is normal. There is moderately elevated pulmonary artery systolic pressure. The estimated right ventricular systolic pressure is 90.2 mmHg.  3. Left atrial size was mildly dilated.  4. The mitral valve is normal in structure. Mild mitral valve regurgitation.  5. The aortic valve was not well visualized,Calcification noted. Aortic valve regurgitation is mild. Mild to moderate aortic valve sclerosis/calcification is present, unable to exclude mild aortic valve regurgitation.  6. The inferior vena cava is dilated in size with <50% respiratory variability, suggesting right atrial pressure of 15 mmHg.  7. A large pericardial effusion is present. 2.74 to 3.39 cm circumferential, no significant RA or RV collapse. Mitral and tricuspid valve in-flow velocities not measured. Grossly, there does not appear to be tamponade. Of concern is the dilated IVC with  little respiratory variation and moderately elevated right heart pressures. FINDINGS  Left Ventricle: Left ventricular ejection fraction, by estimation, is 60 to 65%. The left ventricle has normal function. The left ventricle has no regional wall motion  abnormalities. The left ventricular internal cavity size was normal in size. There is  mild left ventricular hypertrophy. Left ventricular diastolic parameters are indeterminate. Right Ventricle: The right ventricular size is normal. No increase in right ventricular wall thickness. Right ventricular systolic function is normal. There is moderately elevated pulmonary artery systolic pressure. The tricuspid regurgitant velocity is 3.14 m/s, and with an assumed right atrial pressure of 15 mmHg, the estimated right ventricular systolic pressure is 40.9 mmHg. Left Atrium: Left atrial size was mildly dilated. Right Atrium: Right atrial size was normal in size. Pericardium: A large pericardial effusion is present. Mitral Valve: The mitral valve is normal in structure. Mild mitral valve regurgitation. No evidence of mitral valve stenosis. Tricuspid Valve: The tricuspid valve is normal in structure. Tricuspid valve regurgitation is mild . No evidence of tricuspid stenosis. Aortic Valve: The aortic valve was not well visualized. Aortic valve regurgitation is mild. Mild to moderate aortic valve sclerosis/calcification is present, without any evidence of aortic stenosis. Aortic valve mean gradient measures 13.0 mmHg. Aortic valve peak gradient measures 26.0 mmHg. Aortic valve area, by VTI measures 0.81 cm. Pulmonic Valve: The pulmonic valve was normal in structure. Pulmonic valve regurgitation is not visualized. No evidence of pulmonic stenosis. Aorta: The aortic root is normal in size and structure. Venous: The inferior vena cava is dilated in size with less than 50% respiratory variability, suggesting right atrial pressure of 15 mmHg. IAS/Shunts: No atrial level shunt detected by color flow Doppler.  LEFT VENTRICLE PLAX 2D LVIDd:         3.09 cm   Diastology LVIDs:         2.12 cm   LV e' medial:    7.51 cm/s LV PW:         0.78 cm   LV E/e' medial:  17.0 LV IVS:        1.02 cm   LV e' lateral:   6.80 cm/s LVOT diam:      1.70 cm   LV E/e' lateral: 18.8 LV SV:         33 LV SV Index:   17 LVOT Area:     2.27 cm  RIGHT VENTRICLE RV Mid diam:    2.15 cm RV S prime:     9.96 cm/s LEFT ATRIUM             Index LA diam:        4.37 cm 2.18 cm/m LA Vol (A2C):   27.9  ml 13.93 ml/m LA Vol (A4C):   29.0 ml 14.48 ml/m LA Biplane Vol: 30.8 ml 15.37 ml/m  AORTIC VALVE                     PULMONIC VALVE AV Area (Vmax):    0.93 cm      PV Vmax:        0.83 m/s AV Area (Vmean):   0.91 cm      PV Peak grad:   2.7 mmHg AV Area (VTI):     0.81 cm      RVOT Peak grad: 2 mmHg AV Vmax:           255.00 cm/s AV Vmean:          162.000 cm/s AV VTI:            0.410 m AV Peak Grad:      26.0 mmHg AV Mean Grad:      13.0 mmHg LVOT Vmax:         105.00 cm/s LVOT Vmean:        65.000 cm/s LVOT VTI:          0.147 m LVOT/AV VTI ratio: 0.36  AORTA Ao Root diam: 2.50 cm MITRAL VALVE                TRICUSPID VALVE MV Area (PHT): 3.83 cm     TR Peak grad:   39.4 mmHg MV Decel Time: 198 msec     TR Vmax:        314.00 cm/s MV E velocity: 128.00 cm/s                             SHUNTS                             Systemic VTI:  0.15 m                             Systemic Diam: 1.70 cm Ida Rogue MD Electronically signed by Ida Rogue MD Signature Date/Time: 08/05/2021/3:49:45 PM    Final    ECHOCARDIOGRAM LIMITED  Result Date: 08/06/2021    ECHOCARDIOGRAM LIMITED REPORT   Patient Name:   ZULAY CORRIE Date of Exam: 08/06/2021 Medical Rec #:  [id number]          Height:       62.0 in Accession #:    [id number]         Weight:       224.9 lb Date of Birth:  14-Jan-1946           BSA:          2.009 m Patient Age:    37 years           BP:           100/27 mmHg Patient Gender: F                  HR:           92 bpm. Exam Location:  [facility] Procedure: Limited Echo, Limited Color Doppler and Cardiac Doppler Indications:     I31.3 Pericardial effusion  History:         Patient has prior history of Echocardiogram examinations, most  recent 08/05/2021. HFpEF, CKD; Risk Factors:Hypertension,                  Diabetes and Dyslipidemia.  Sonographer:     Charmayne Sheer Referring Phys:  [phone] CHRISTOPHER END Diagnosing Phys: Nelva Bush MD  Sonographer Comments: Technically difficult study due to poor echo windows. Image acquisition challenging due to patient body habitus. IMPRESSIONS  1. Left ventricular ejection fraction, by estimation, is >55%. The left ventricle has normal function. Left ventricular endocardial border not optimally defined to evaluate regional wall motion.  2. Right ventricular systolic function is normal. The right ventricular size is normal. There is mildly elevated pulmonary artery systolic pressure.  3. The pericardial effusion has decreased in size since yesterday but still appears large. The largest collections lie posterolateral to the left ventricle.. There is no evidence of cardiac tamponade.  4. The inferior vena cava is dilated in size with >50% respiratory variability, suggesting right atrial pressure of 8 mmHg. FINDINGS  Left Ventricle: Left ventricular ejection fraction, by estimation, is >55%. The left ventricle has normal function. Left ventricular endocardial border not optimally defined to evaluate regional wall motion. Right Ventricle: The right ventricular size is normal. Right ventricular systolic function is normal. There is mildly elevated pulmonary artery systolic pressure. The tricuspid regurgitant velocity is 3.01 m/s, and with an assumed right atrial pressure of 8 mmHg, the estimated right ventricular systolic pressure is 35.3 mmHg. Pericardium: The pericardial effusion has decreased in size since yesterday but still appears large. The largest collections lie posterolateral to the left ventricle. There is no evidence of cardiac tamponade. Venous: The inferior vena cava is dilated in size with greater than 50% respiratory variability, suggesting right atrial pressure of 8 mmHg. LEFT VENTRICLE PLAX 2D  LVIDd:         3.60 cm LVIDs:         2.60 cm LV PW:         1.10 cm LV IVS:        0.90 cm  LEFT ATRIUM         Index LA diam:    4.10 cm 2.04 cm/m  TRICUSPID VALVE TR Peak grad:   36.2 mmHg TR Vmax:        301.00 cm/s Nelva Bush MD Electronically signed by Nelva Bush MD Signature Date/Time: 08/06/2021/10:42:23 AM    Final    CT ANGIO GI BLEED  Result Date: 08/04/2021 CLINICAL DATA:  Painless bloody stools with down trending hemoglobin, initial encounter EXAM: CTA ABDOMEN AND PELVIS WITHOUT AND WITH CONTRAST TECHNIQUE: Multidetector CT imaging of the abdomen and pelvis was performed using the standard protocol during bolus administration of intravenous contrast. Multiplanar reconstructed images and MIPs were obtained and reviewed to evaluate the vascular anatomy. CONTRAST:  27mL OMNIPAQUE IOHEXOL 350 MG/ML SOLN COMPARISON:  None. FINDINGS: VASCULAR Aorta: Atherosclerotic calcifications of the abdominal aorta are noted. No aneurysmal dilatation or dissection is seen. The aorta is somewhat diminutive Celiac: Atherosclerotic calcifications are noted at the origin with approximately 50% stenosis and mild poststenotic dilatation in the celiac. SMA: Atherosclerotic calcifications are noted with high-grade stenosis although no significant poststenotic dilatation is seen. Renals: Single renal arteries are identified bilaterally. High-grade stenosis is noted in the origin of the renal arteries bilaterally with somewhat diminutive for renal artery branches likely related to the hypotensive state. IMA: Patent without evidence of aneurysm, dissection, vasculitis or significant stenosis. Inflow: Iliacs demonstrate atherosclerotic calcification without focal high-grade stenosis. Veins: No specific venous abnormality is noted.  Review of the MIP images confirms the above findings. NON-VASCULAR Lower chest: Lung bases demonstrate evidence of left-sided pleural effusion with left lower lobe consolidation. Large  pericardial effusion is noted Hepatobiliary: Liver is fatty infiltrated. Some mild nodularity is noted suggestive of underlying cirrhosis. Mild perihepatic ascites is seen. The gallbladder has been surgically removed. Pancreas: Unremarkable. No pancreatic ductal dilatation or surrounding inflammatory changes. Spleen: Normal in size without focal abnormality. Adrenals/Urinary Tract: Adrenal glands appear within normal limits. Kidneys demonstrate a normal enhancement pattern bilaterally. No obstructive changes are seen. The bladder is partially distended. Stomach/Bowel: In the rectum on the arterial phase images there is a focal area of contrast extravasation identified best seen on image number 225 of series 5 and image number 135 and 134 of series 10. Subsequent venous phase images show this to be more diffusely distributed within the rectum consistent with focal GI bleeding. No other area of contrast extravasation is seen. No other pooling of contrast is noted within the colon. Scattered diverticular change is seen without evidence of diverticulitis. Small bowel and stomach appear within normal limits. Lymphatic: No significant lymphadenopathy is noted. Reproductive: Uterus demonstrates multiple calcified uterine fibroids. No definitive adnexal mass is seen. Other: Mild ascites is noted surrounding the liver and spleen. Mild changes of anasarca are noted Musculoskeletal: Degenerative changes of lumbar spine and hip joints are noted. No acute bony abnormality is seen. IMPRESSION: VASCULAR Changes consistent with active GI hemorrhage within the distal rectum as described. No other focal area of acute hemorrhage is seen. 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 Mild ascites is noted as well. Left-sided pleural effusion with lower lobe consolidation. Large pericardial effusion.  Diverticulosis without diverticulitis. Uterine fibroid change. Aortic Atherosclerosis (ICD10-I70.0). Critical Value/emergent results were called by telephone at the time of interpretation on 08/04/2021 at 11:27 pm to [facility], NP , who verbally acknowledged these results. Electronically Signed   By: Inez Catalina M.D.   On: 08/04/2021 23:31     Scheduled Meds:  Chlorhexidine Gluconate Cloth  6 each Topical Daily   folic acid  1 mg Oral Daily   insulin aspart  0-5 Units Subcutaneous QHS   insulin aspart  0-9 Units Subcutaneous TID WC   latanoprost  1 drop Both Eyes QHS   midodrine  10 mg Oral TID WC   pantoprazole (PROTONIX) IV  40 mg Intravenous Q12H   Ensure Max Protein  11 oz Oral BID   vitamin B-12  500 mcg Oral Daily   Continuous Infusions:  sodium chloride Stopped (08/05/21 1441)   albumin human 12.5 g (08/06/21 0817)   phenylephrine (NEO-SYNEPHRINE) Adult infusion 70 mcg/min (08/06/21 1203)     LOS: 11 days   Flora Lipps, MD [facility] If 7PM-7AM, please contact night-coverage 08/06/2021, 4:01 PM

## 2021-08-06 NOTE — Progress Notes (Signed)
OT Cancellation Note  Patient Details Name: Karen Farley MRN: [id number] DOB: 1945-11-11   Cancelled Treatment:    Reason Eval/Treat Not Completed: Other (comment). Chart reviewed. Pt noted with transfer to ICU over the weekend due to a change in medical status requiring higher level of care. No continue at transfer in place for current therapy orders. Per therapy protocol, will require new therapy orders. Secure chat sent to MD. Will hold OT and re-attempt if/when new therapy orders are received.   Ardeth Perfect., MPH, MS, OTR/L ascom [phone] 08/06/21, 8:16 AM

## 2021-08-06 NOTE — Progress Notes (Signed)
PT Cancellation Note  Patient Details Name: Karen Farley MRN: [id number] DOB: 02/23/1946   Cancelled Treatment:    Reason Eval/Treat Not Completed: Other (comment) Chart reviewed & pt noted to have transferred to ICU over the weekend 2/2 change in medical status requiring higher level of care. No continue at transfer orders in place. Per therapy protocol, will require new PT orders & MD notified. Will complete current orders & await new ones if pt is/becomes appropriate.   Lavone Nian, PT, DPT 08/06/21, 10:31 AM   [name] 08/06/2021, 10:30 AM

## 2021-08-06 NOTE — Progress Notes (Signed)
Progress Note  Patient Name: Karen Farley Date of Encounter: 08/06/2021  [facility] Cardiologist: Kate Sable, MD   Subjective   Breathing improved.  Still feels swollen.  No chest pain, palpitations, or lightheadedness.  Patient remains on phenylephrine despite pericardiocentesis with drainage of greater than 1 L of fluid thus far.  Inpatient Medications    Scheduled Meds:  Chlorhexidine Gluconate Cloth  6 each Topical Daily   folic acid  1 mg Oral Daily   insulin aspart  0-5 Units Subcutaneous QHS   insulin aspart  0-9 Units Subcutaneous TID WC   latanoprost  1 drop Both Eyes QHS   midodrine  10 mg Oral TID WC   pantoprazole (PROTONIX) IV  40 mg Intravenous Q12H   Ensure Max Protein  11 oz Oral BID   vitamin B-12  500 mcg Oral Daily   Continuous Infusions:  sodium chloride Stopped (08/05/21 1441)   albumin human 12.5 g (08/06/21 0817)   phenylephrine (NEO-SYNEPHRINE) Adult infusion 80 mcg/min (08/06/21 1100)   PRN Meds: acetaminophen, lidocaine HCl (PF), lip balm, polyethylene glycol   Vital Signs    Vitals:   08/06/21 0715 08/06/21 0900 08/06/21 1000 08/06/21 1100  BP:  (!) 88/61 (!) 89/64 (!) 94/56  Pulse: 85 81 80 85  Resp: (!) 22 17 18 20   Temp:      TempSrc:      SpO2: 100% 100% 100% 100%  Weight:      Height:        Intake/Output Summary (Last 24 hours) at 08/06/2021 1148 Last data filed at 08/06/2021 1100 Gross per 24 hour  Intake 1588.3 ml  Output 1650 ml  Net -61.7 ml   Last 3 Weights 08/06/2021 08/05/2021 08/04/2021  Weight (lbs) 224 lb 13.9 oz 223 lb 5.2 oz 224 lb 3.3 oz  Weight (kg) 102 kg 101.3 kg 101.7 kg      Telemetry    Atrial fibrillation with ventricular rates 70-110 bpm) - Personally Reviewed  ECG    No new tracing.  Physical Exam   GEN: No acute distress.   Neck: No JVD Cardiac: Distant heart sounds, irregularly irregular without murmurs. Respiratory: Mildly diminished breath sounds on the right, absent  breath sounds at the left lung base.  No wheezes or crackles. GI: Soft, nontender, non-distended  MS: Anasarca noted; No deformity. Neuro:  Nonfocal  Psych: Normal affect   Labs    High Sensitivity Troponin:   Recent Labs  Lab 07/26/21 1708  TROPONINIHS 17     Chemistry Recent Labs  Lab 08/04/21 0504 08/05/21 0444 08/06/21 0510  NA 136 140 143  K 3.8 4.0 3.4*  CL 93* 100 104  CO2 31 30 28   GLUCOSE 214* 244* 148*  BUN 62* 87* 67*  CREATININE 1.75* 1.55* 1.43*  CALCIUM 8.9 8.9 9.3  MG 2.2 2.1 2.2  PROT  --   --  5.3*  ALBUMIN  --   --  3.6  AST  --   --  21  ALT  --   --  15  ALKPHOS  --   --  40  BILITOT  --   --  2.6*  GFRNONAA 30* 35* 38*  ANIONGAP 12 10 11     Lipids No results for input(s): CHOL, TRIG, HDL, LABVLDL, LDLCALC, CHOLHDL in the last 168 hours.  Hematology Recent Labs  Lab 08/04/21 1233 08/04/21 1845 08/05/21 0444 08/05/21 1106 08/05/21 1706 08/05/21 2308 08/06/21 0510  WBC 10.9*  --  13.1*  --   --   --  14.6*  RBC 3.95  --  4.09  --   --   --  3.70*  HGB 9.7*   < > 11.2*   < > 10.3* 9.9* 10.3*  HCT 30.0*   < > 32.8*   < > 31.4* 29.4* 31.9*  MCV 75.9*  --  80.2  --   --   --  86.2  MCH 24.6*  --  27.4  --   --   --  27.8  MCHC 32.3  --  34.1  --   --   --  32.3  RDW 18.3*  --  17.2*  --   --   --  19.8*  PLT 288  --  244  --   --   --  227   < > = values in this interval not displayed.   Thyroid  Recent Labs  Lab 08/01/21 0434  TSH 0.902    BNP Recent Labs  Lab 08/05/21 0444  BNP 184.5*    DDimer No results for input(s): DDIMER in the last 168 hours.   Radiology    CARDIAC CATHETERIZATION  Result Date: 08/05/2021 Successful pericardiocentesis with placement of 6 French pigtail catheter into pericardial space with removal of 700 cc of bloody pericardial fluid   DG Chest Port 1 View  Result Date: 08/05/2021 CLINICAL DATA:  Acute onset of shortness of breath, initial encounter EXAM: PORTABLE CHEST 1 VIEW COMPARISON:  CT  from the previous day. FINDINGS: Cardiac shadow is prominent. Pericardial drainage catheter is now seen. Left-sided pleural effusion is noted increased when compare with the prior exam with associated left basilar atelectasis. Right lung remains clear. No bony abnormality is noted. IMPRESSION: Pericardial catheter in place. Enlarging left-sided effusion. Electronically Signed   By: Inez Catalina M.D.   On: 08/05/2021 20:01   ECHOCARDIOGRAM COMPLETE  Result Date: 08/05/2021    ECHOCARDIOGRAM REPORT   Patient Name:   Karen Farley Date of Exam: 08/05/2021 Medical Rec #:  [id number]          Height:       62.0 in Accession #:    [id number]         Weight:       223.3 lb Date of Birth:  02-24-1946           BSA:          2.003 m Patient Age:    75 years           BP:           89/53 mmHg Patient Gender: F                  HR:           107 bpm. Exam Location:  [facility] Procedure: 2D Echo, Cardiac Doppler and Color Doppler Indications:     Pericardial effusion I31.3  History:         Patient has prior history of Echocardiogram examinations. CHF;                  Risk Factors:Hypertension and Diabetes.  Sonographer:     Alyse Low Roar Referring Phys:  [id number] BRITTON L R[place]STER Diagnosing Phys: Ida Rogue MD IMPRESSIONS  1. Left ventricular ejection fraction, by estimation, is 60 to 65%. The left ventricle has normal function. The left ventricle has no regional wall motion abnormalities. There is mild left ventricular hypertrophy. Left ventricular diastolic parameters  are indeterminate.  2. Right ventricular systolic function is normal. The right ventricular size is normal. There is moderately elevated pulmonary artery systolic pressure. The estimated right ventricular systolic pressure is 79.1 mmHg.  3. Left atrial size was mildly dilated.  4. The mitral valve is normal in structure. Mild mitral valve regurgitation.  5. The aortic valve was not well visualized,Calcification noted. Aortic valve regurgitation is  mild. Mild to moderate aortic valve sclerosis/calcification is present, unable to exclude mild aortic valve regurgitation.  6. The inferior vena cava is dilated in size with <50% respiratory variability, suggesting right atrial pressure of 15 mmHg.  7. A large pericardial effusion is present. 2.74 to 3.39 cm circumferential, no significant RA or RV collapse. Mitral and tricuspid valve in-flow velocities not measured. Grossly, there does not appear to be tamponade. Of concern is the dilated IVC with  little respiratory variation and moderately elevated right heart pressures. FINDINGS  Left Ventricle: Left ventricular ejection fraction, by estimation, is 60 to 65%. The left ventricle has normal function. The left ventricle has no regional wall motion abnormalities. The left ventricular internal cavity size was normal in size. There is  mild left ventricular hypertrophy. Left ventricular diastolic parameters are indeterminate. Right Ventricle: The right ventricular size is normal. No increase in right ventricular wall thickness. Right ventricular systolic function is normal. There is moderately elevated pulmonary artery systolic pressure. The tricuspid regurgitant velocity is 3.14 m/s, and with an assumed right atrial pressure of 15 mmHg, the estimated right ventricular systolic pressure is 50.5 mmHg. Left Atrium: Left atrial size was mildly dilated. Right Atrium: Right atrial size was normal in size. Pericardium: A large pericardial effusion is present. Mitral Valve: The mitral valve is normal in structure. Mild mitral valve regurgitation. No evidence of mitral valve stenosis. Tricuspid Valve: The tricuspid valve is normal in structure. Tricuspid valve regurgitation is mild . No evidence of tricuspid stenosis. Aortic Valve: The aortic valve was not well visualized. Aortic valve regurgitation is mild. Mild to moderate aortic valve sclerosis/calcification is present, without any evidence of aortic stenosis. Aortic valve  mean gradient measures 13.0 mmHg. Aortic valve peak gradient measures 26.0 mmHg. Aortic valve area, by VTI measures 0.81 cm. Pulmonic Valve: The pulmonic valve was normal in structure. Pulmonic valve regurgitation is not visualized. No evidence of pulmonic stenosis. Aorta: The aortic root is normal in size and structure. Venous: The inferior vena cava is dilated in size with less than 50% respiratory variability, suggesting right atrial pressure of 15 mmHg. IAS/Shunts: No atrial level shunt detected by color flow Doppler.  LEFT VENTRICLE PLAX 2D LVIDd:         3.09 cm   Diastology LVIDs:         2.12 cm   LV e' medial:    7.51 cm/s LV PW:         0.78 cm   LV E/e' medial:  17.0 LV IVS:        1.02 cm   LV e' lateral:   6.80 cm/s LVOT diam:     1.70 cm   LV E/e' lateral: 18.8 LV SV:         33 LV SV Index:   17 LVOT Area:     2.27 cm  RIGHT VENTRICLE RV Mid diam:    2.15 cm RV S prime:     9.96 cm/s LEFT ATRIUM             Index LA diam:  4.37 cm 2.18 cm/m LA Vol (A2C):   27.9 ml 13.93 ml/m LA Vol (A4C):   29.0 ml 14.48 ml/m LA Biplane Vol: 30.8 ml 15.37 ml/m  AORTIC VALVE                     PULMONIC VALVE AV Area (Vmax):    0.93 cm      PV Vmax:        0.83 m/s AV Area (Vmean):   0.91 cm      PV Peak grad:   2.7 mmHg AV Area (VTI):     0.81 cm      RVOT Peak grad: 2 mmHg AV Vmax:           255.00 cm/s AV Vmean:          162.000 cm/s AV VTI:            0.410 m AV Peak Grad:      26.0 mmHg AV Mean Grad:      13.0 mmHg LVOT Vmax:         105.00 cm/s LVOT Vmean:        65.000 cm/s LVOT VTI:          0.147 m LVOT/AV VTI ratio: 0.36  AORTA Ao Root diam: 2.50 cm MITRAL VALVE                TRICUSPID VALVE MV Area (PHT): 3.83 cm     TR Peak grad:   39.4 mmHg MV Decel Time: 198 msec     TR Vmax:        314.00 cm/s MV E velocity: 128.00 cm/s                             SHUNTS                             Systemic VTI:  0.15 m                             Systemic Diam: 1.70 cm Ida Rogue MD Electronically  signed by Ida Rogue MD Signature Date/Time: 08/05/2021/3:49:45 PM    Final    ECHOCARDIOGRAM LIMITED  Result Date: 08/06/2021    ECHOCARDIOGRAM LIMITED REPORT   Patient Name:   CECILE GILLISPIE Date of Exam: 08/06/2021 Medical Rec #:  [id number]          Height:       62.0 in Accession #:    [id number]         Weight:       224.9 lb Date of Birth:  12-Feb-1946           BSA:          2.009 m Patient Age:    89 years           BP:           100/27 mmHg Patient Gender: F                  HR:           92 bpm. Exam Location:  [facility] Procedure: Limited Echo, Limited Color Doppler and Cardiac Doppler Indications:     I31.3 Pericardial effusion  History:         Patient has prior history of Echocardiogram examinations, most  recent 08/05/2021. HFpEF, CKD; Risk Factors:Hypertension,                  Diabetes and Dyslipidemia.  Sonographer:     Charmayne Sheer Referring Phys:  [phone] Alma Mohiuddin Diagnosing Phys: Nelva Bush MD  Sonographer Comments: Technically difficult study due to poor echo windows. Image acquisition challenging due to patient body habitus. IMPRESSIONS  1. Left ventricular ejection fraction, by estimation, is >55%. The left ventricle has normal function. Left ventricular endocardial border not optimally defined to evaluate regional wall motion.  2. Right ventricular systolic function is normal. The right ventricular size is normal. There is mildly elevated pulmonary artery systolic pressure.  3. The pericardial effusion has decreased in size since yesterday but still appears large. The largest collections lie posterolateral to the left ventricle.. There is no evidence of cardiac tamponade.  4. The inferior vena cava is dilated in size with >50% respiratory variability, suggesting right atrial pressure of 8 mmHg. FINDINGS  Left Ventricle: Left ventricular ejection fraction, by estimation, is >55%. The left ventricle has normal function. Left ventricular endocardial border not  optimally defined to evaluate regional wall motion. Right Ventricle: The right ventricular size is normal. Right ventricular systolic function is normal. There is mildly elevated pulmonary artery systolic pressure. The tricuspid regurgitant velocity is 3.01 m/s, and with an assumed right atrial pressure of 8 mmHg, the estimated right ventricular systolic pressure is 62.9 mmHg. Pericardium: The pericardial effusion has decreased in size since yesterday but still appears large. The largest collections lie posterolateral to the left ventricle. There is no evidence of cardiac tamponade. Venous: The inferior vena cava is dilated in size with greater than 50% respiratory variability, suggesting right atrial pressure of 8 mmHg. LEFT VENTRICLE PLAX 2D LVIDd:         3.60 cm LVIDs:         2.60 cm LV PW:         1.10 cm LV IVS:        0.90 cm  LEFT ATRIUM         Index LA diam:    4.10 cm 2.04 cm/m  TRICUSPID VALVE TR Peak grad:   36.2 mmHg TR Vmax:        301.00 cm/s Nelva Bush MD Electronically signed by Nelva Bush MD Signature Date/Time: 08/06/2021/10:42:23 AM    Final    CT ANGIO GI BLEED  Result Date: 08/04/2021 CLINICAL DATA:  Painless bloody stools with down trending hemoglobin, initial encounter EXAM: CTA ABDOMEN AND PELVIS WITHOUT AND WITH CONTRAST TECHNIQUE: Multidetector CT imaging of the abdomen and pelvis was performed using the standard protocol during bolus administration of intravenous contrast. Multiplanar reconstructed images and MIPs were obtained and reviewed to evaluate the vascular anatomy. CONTRAST:  53mL OMNIPAQUE IOHEXOL 350 MG/ML SOLN COMPARISON:  None. FINDINGS: VASCULAR Aorta: Atherosclerotic calcifications of the abdominal aorta are noted. No aneurysmal dilatation or dissection is seen. The aorta is somewhat diminutive Celiac: Atherosclerotic calcifications are noted at the origin with approximately 50% stenosis and mild poststenotic dilatation in the celiac. SMA: Atherosclerotic  calcifications are noted with high-grade stenosis although no significant poststenotic dilatation is seen. Renals: Single renal arteries are identified bilaterally. High-grade stenosis is noted in the origin of the renal arteries bilaterally with somewhat diminutive for renal artery branches likely related to the hypotensive state. IMA: Patent without evidence of aneurysm, dissection, vasculitis or significant stenosis. Inflow: Iliacs demonstrate atherosclerotic calcification without focal high-grade stenosis. Veins: No specific venous abnormality is  noted. Review of the MIP images confirms the above findings. NON-VASCULAR Lower chest: Lung bases demonstrate evidence of left-sided pleural effusion with left lower lobe consolidation. Large pericardial effusion is noted Hepatobiliary: Liver is fatty infiltrated. Some mild nodularity is noted suggestive of underlying cirrhosis. Mild perihepatic ascites is seen. The gallbladder has been surgically removed. Pancreas: Unremarkable. No pancreatic ductal dilatation or surrounding inflammatory changes. Spleen: Normal in size without focal abnormality. Adrenals/Urinary Tract: Adrenal glands appear within normal limits. Kidneys demonstrate a normal enhancement pattern bilaterally. No obstructive changes are seen. The bladder is partially distended. Stomach/Bowel: In the rectum on the arterial phase images there is a focal area of contrast extravasation identified best seen on image number 225 of series 5 and image number 135 and 134 of series 10. Subsequent venous phase images show this to be more diffusely distributed within the rectum consistent with focal GI bleeding. No other area of contrast extravasation is seen. No other pooling of contrast is noted within the colon. Scattered diverticular change is seen without evidence of diverticulitis. Small bowel and stomach appear within normal limits. Lymphatic: No significant lymphadenopathy is noted. Reproductive: Uterus  demonstrates multiple calcified uterine fibroids. No definitive adnexal mass is seen. Other: Mild ascites is noted surrounding the liver and spleen. Mild changes of anasarca are noted Musculoskeletal: Degenerative changes of lumbar spine and hip joints are noted. No acute bony abnormality is seen. IMPRESSION: VASCULAR Changes consistent with active GI hemorrhage within the distal rectum as described. No other focal area of acute hemorrhage is seen. 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 Mild ascites is noted as well. Left-sided pleural effusion with lower lobe consolidation. Large pericardial effusion. Diverticulosis without diverticulitis. Uterine fibroid change. Aortic Atherosclerosis (ICD10-I70.0). Critical Value/emergent results were called by telephone at the time of interpretation on 08/04/2021 at 11:27 pm to [facility], NP , who verbally acknowledged these results. Electronically Signed   By: Inez Catalina M.D.   On: 08/04/2021 23:31    Cardiac Studies   See above.  Patient Profile     75 y.o. female with history of permanent atrial fibrillation, hypertension, chronic HFpEF, chronic kidney disease stage IIIa, and hyperlipidemia, admitted with concern for GI bleed and found to have hypotension and large pericardial effusion status post pericardiocentesis.  Assessment & Plan    Pericardial effusion: Patient status post pericardiocentesis yesterday.  She reports feeling better.  Limited echo this morning showed improved pericardial effusion though a large effusion still remains.  In the setting of persistent hypotension, and additional 450 mL of serosanguineous fluid was aspirated at the bedside (total 1150 mL out thus far).  Limited echo performed at the bedside afterwards shows near complete resolution of the effusion.  Cytology still  pending. -Pericardial drain placed 2 Hemovac.  Output should be recorded every 4 hours. Repeat limited echo tomorrow.  Plan to remove drain when no significant effusion is present and drain output is less than 50-100 mL per 24 hours). -Follow-up cytology. -Defer rechallenging with anticoagulation at this time.  Permanent atrial fibrillation: Rate is adequately controlled. -Minimize vasopressors, as possible. -Defer rechallenging with anticoagulation at this time in the setting of large serosanguineous pericardial effusion.  Shock: Thought to be due to a combination of acute blood loss/hypovolemia and cardiac tamponade.  Hemodynamics have not improved significantly following pericardiocentesis, suggesting that some other etiology is playing a large role. -Wean vasopressors as tolerated. -Consider judicious hydration  as the patient is likely preload dependent in the setting of her pericardial effusion.  Acute GI bleed: Hemoglobin stable. -Hold apixaban. -Proceed with sigmoidoscopy once hemodynamics have stabilized.  For questions or updates, please contact [place] Please consult [email or web address] for contact info under [facility] Cardiology.    Signed, Nelva Bush, MD  08/06/2021, 11:48 AM

## 2021-08-07 ENCOUNTER — Inpatient Hospital Stay (HOSPITAL_COMMUNITY)
Admit: 2021-08-07 | Discharge: 2021-08-07 | Disposition: A | Discharge status: Home / Self Care | Payer: Medicare PPO | Attending: Internal Medicine | Admitting: Internal Medicine

## 2021-08-07 ENCOUNTER — Ambulatory Visit: Payer: Medicare PPO | Admitting: Family

## 2021-08-07 DIAGNOSIS — Z7901 Long term (current) use of anticoagulants: Secondary | ICD-10-CM | POA: Diagnosis not present

## 2021-08-07 DIAGNOSIS — I3139 Other pericardial effusion (noninflammatory): Secondary | ICD-10-CM

## 2021-08-07 DIAGNOSIS — D62 Acute posthemorrhagic anemia: Secondary | ICD-10-CM | POA: Diagnosis not present

## 2021-08-07 DIAGNOSIS — I4821 Permanent atrial fibrillation: Secondary | ICD-10-CM | POA: Diagnosis not present

## 2021-08-07 DIAGNOSIS — I5033 Acute on chronic diastolic (congestive) heart failure: Secondary | ICD-10-CM | POA: Diagnosis not present

## 2021-08-07 LAB — CBC
HCT: 29.1 % — ABNORMAL LOW (ref 36.0–46.0)
Hemoglobin: 9.4 g/dL — ABNORMAL LOW (ref 12.0–15.0)
MCH: 28 pg (ref 26.0–34.0)
MCHC: 32.3 g/dL (ref 30.0–36.0)
MCV: 86.6 fL (ref 80.0–100.0)
Platelets: 205 10*3/uL (ref 150–400)
RBC: 3.36 MIL/uL — ABNORMAL LOW (ref 3.87–5.11)
RDW: 21.2 % — ABNORMAL HIGH (ref 11.5–15.5)
WBC: 13.4 10*3/uL — ABNORMAL HIGH (ref 4.0–10.5)
nRBC: 1.9 % — ABNORMAL HIGH (ref 0.0–0.2)

## 2021-08-07 LAB — BASIC METABOLIC PANEL
Anion gap: 7 (ref 5–15)
BUN: 51 mg/dL — ABNORMAL HIGH (ref 8–23)
CO2: 33 mmol/L — ABNORMAL HIGH (ref 22–32)
Calcium: 9.2 mg/dL (ref 8.9–10.3)
Chloride: 104 mmol/L (ref 98–111)
Creatinine, Ser: 1.3 mg/dL — ABNORMAL HIGH (ref 0.44–1.00)
GFR, Estimated: 43 mL/min — ABNORMAL LOW (ref >60)
Glucose, Bld: 147 mg/dL — ABNORMAL HIGH (ref 70–99)
Potassium: 3.4 mmol/L — ABNORMAL LOW (ref 3.5–5.1)
Sodium: 144 mmol/L (ref 135–145)

## 2021-08-07 LAB — MISC LABCORP TEST (SEND OUT)

## 2021-08-07 LAB — PHOSPHORUS: Phosphorus: 3.1 mg/dL (ref 2.5–4.6)

## 2021-08-07 LAB — ECHOCARDIOGRAM LIMITED
Height: 62 in
S' Lateral: 3.13 cm
Weight: 3597.91 oz

## 2021-08-07 LAB — GLUCOSE, CAPILLARY
Glucose-Capillary: 157 mg/dL — ABNORMAL HIGH (ref 70–99)
Glucose-Capillary: 176 mg/dL — ABNORMAL HIGH (ref 70–99)
Glucose-Capillary: 184 mg/dL — ABNORMAL HIGH (ref 70–99)
Glucose-Capillary: 160 mg/dL — ABNORMAL HIGH (ref 70–99)
Glucose-Capillary: 164 mg/dL — ABNORMAL HIGH (ref 70–99)

## 2021-08-07 LAB — ANA W/REFLEX IF POSITIVE: Anti Nuclear Antibody (ANA): NEGATIVE

## 2021-08-07 LAB — MAGNESIUM: Magnesium: 2.1 mg/dL (ref 1.7–2.4)

## 2021-08-07 NOTE — Progress Notes (Signed)
[facility]  ROUNDING NOTE   Subjective:   [name] was admitted to [facility] on 07/26/2021 for Morbid obesity ([place]) [E66.01] DOE (dyspnea on exertion) [R06.09] Recurrent left pleural effusion [J90] Hypotension [I95.9] Type 2 diabetes mellitus without complication, without long-term current use of insulin ([place]) [E11.9] Atrial fibrillation, unspecified type ([place]) [I48.91] Chronic congestive heart failure, unspecified heart failure type [facility]) [I50.9]  Patient was last seen by my partner, Dr. Candiss Norse, on 05/31/21. Where torsemide was restarted and to continue metolazone.   Patient admitted with shortness of breath and increasing peripheral edema. She was then started on IV furosemide. Left thoracentesis done on 07/28/21  653mL of pleural fluid.   Patient seen sitting up in bed, husband and nephew at bedside. Improved bilateral leg edema, trace bilateral hand edema Creatinine improved to 1.3, documented urine output 850 mL over previous 24 hours  Objective:  Vital signs in last 24 hours:  Temp:  [97.6 F (36.4 C)-98 F (36.7 C)] 97.6 F (36.4 C) (10/18 0900) Pulse Rate:  [71-109] 88 (10/18 1200) Resp:  [16-32] 23 (10/18 1200) BP: (65-129)/(41-100) 102/74 (10/18 1200) SpO2:  [93 %-100 %] 95 % (10/18 1200)  Weight change:  Filed Weights   08/04/21 2020 08/05/21 0413 08/06/21 0253  Weight: 101.7 kg 101.3 kg 102 kg    Intake/Output: I/O last 3 completed shifts: In: 1918.3 [P.O.:660; I.V.:1015.8; IV Piggyback:242.5] Out: 6644 [Urine:1800; Drains:50]   Intake/Output this shift:  Total I/O In: 277.2 [P.O.:240; I.V.:21.9; IV Piggyback:15.4] Out: 0   Physical Exam: General: NAD, resting in bed  Head: Normocephalic, atraumatic. Moist oral mucosal membranes  Eyes: Anicteric  Lungs:  Clear bilaterally, normal effort  Heart: Irregular rhythm  Abdomen:  Soft, nontender  Extremities:  + peripheral edema.  Neurologic: Nonfocal, moving all four extremities   Skin: Bilateral lower extremity erythema and fluid filled blisters, bandaged        Basic Metabolic Panel: Recent Labs  Lab 08/03/21 0454 08/04/21 0504 08/05/21 0444 08/06/21 0510 08/07/21 0552  NA 135 136 140 143 144  K 4.1 3.8 4.0 3.4* 3.4*  CL 95* 93* 100 104 104  CO2 29 31 30 28  33*  GLUCOSE 280* 214* 244* 148* 147*  BUN 60* 62* 87* 67* 51*  CREATININE 1.75* 1.75* 1.55* 1.43* 1.30*  CALCIUM 9.2 8.9 8.9 9.3 9.2  MG 2.7* 2.2 2.1 2.2 2.1  PHOS 3.3 2.7 3.7 3.5 3.1     Liver Function Tests: Recent Labs  Lab 08/06/21 0510  AST 21  ALT 15  ALKPHOS 40  BILITOT 2.6*  PROT 5.3*  ALBUMIN 3.6    No results for input(s): LIPASE, AMYLASE in the last 168 hours. No results for input(s): AMMONIA in the last 168 hours.  CBC: Recent Labs  Lab 08/01/21 0434 08/02/21 0432 08/04/21 0504 08/04/21 1233 08/04/21 1845 08/05/21 0444 08/05/21 1106 08/05/21 1706 08/05/21 2308 08/06/21 0510 08/06/21 2159 08/07/21 0552  WBC 11.3*   < > 9.0 10.9*  --  13.1*  --   --   --  14.6*  --  13.4*  NEUTROABS 9.9*  --   --   --   --   --   --   --   --  10.8*  --   --   HGB 13.6   < > 12.4 9.7*   < > 11.2*   < > 10.3* 9.9* 10.3* 9.3* 9.4*  HCT 41.9   < > 38.4 30.0*   < > 32.8*   < >  31.4* 29.4* 31.9* 29.5* 29.1*  MCV 75.9*   < > 74.9* 75.9*  --  80.2  --   --   --  86.2  --  86.6  PLT 398   < > 311 288  --  244  --   --   --  227  --  205   < > = values in this interval not displayed.     Cardiac Enzymes: No results for input(s): CKTOTAL, CKMB, CKMBINDEX, TROPONINI in the last 168 hours.  BNP: Invalid input(s): POCBNP  CBG: Recent Labs  Lab 08/06/21 1125 08/06/21 1702 08/06/21 1950 08/07/21 0753 08/07/21 1200  GLUCAP 149* 207* 161* 157* 176*     Microbiology: Results for orders placed or performed during the hospital encounter of 07/26/21  Resp Panel by RT-PCR (Flu A&B, Covid) Nasopharyngeal Swab     Status: None   Collection Time: 07/26/21  7:19 PM   Specimen:  Nasopharyngeal Swab; Nasopharyngeal(NP) swabs in vial transport medium  Result Value Ref Range Status   SARS Coronavirus 2 by RT PCR NEGATIVE NEGATIVE Final    Comment: (NOTE) SARS-CoV-2 target nucleic acids are NOT DETECTED.  The SARS-CoV-2 RNA is generally detectable in upper respiratory specimens during the acute phase of infection. The lowest concentration of SARS-CoV-2 viral copies this assay can detect is 138 copies/mL. A negative result does not preclude SARS-Cov-2 infection and should not be used as the sole basis for treatment or other patient management decisions. A negative result may occur with  improper specimen collection/handling, submission of specimen other than nasopharyngeal swab, presence of viral mutation(s) within the areas targeted by this assay, and inadequate number of viral copies(<138 copies/mL). A negative result must be combined with clinical observations, patient history, and epidemiological information. The expected result is Negative.  Fact Sheet for Patients:  EntrepreneurPulse.com.au  Fact Sheet for Healthcare Providers:  IncredibleEmployment.be  This test is no t yet approved or cleared by the [place] FDA and  has been authorized for detection and/or diagnosis of SARS-CoV-2 by FDA under an Emergency Use Authorization (EUA). This EUA will remain  in effect (meaning this test can be used) for the duration of the COVID-19 declaration under Section 564(b)(1) of the Act, 21 U.S.C.section 360bbb-3(b)(1), unless the authorization is terminated  or revoked sooner.       Influenza A by PCR NEGATIVE NEGATIVE Final   Influenza B by PCR NEGATIVE NEGATIVE Final    Comment: (NOTE) The Xpert Xpress SARS-CoV-2/FLU/RSV plus assay is intended as an aid in the diagnosis of influenza from Nasopharyngeal swab specimens and should not be used as a sole basis for treatment. Nasal washings and aspirates are unacceptable for  Xpert Xpress SARS-CoV-2/FLU/RSV testing.  Fact Sheet for Patients: EntrepreneurPulse.com.au  Fact Sheet for Healthcare Providers: IncredibleEmployment.be  This test is not yet approved or cleared by the [place] FDA and has been authorized for detection and/or diagnosis of SARS-CoV-2 by FDA under an Emergency Use Authorization (EUA). This EUA will remain in effect (meaning this test can be used) for the duration of the COVID-19 declaration under Section 564(b)(1) of the Act, 21 U.S.C. section 360bbb-3(b)(1), unless the authorization is terminated or revoked.  Performed at [facility], [place], [place] [address]   Body fluid culture w Gram Stain     Status: None   Collection Time: 07/28/21  1:53 PM   Specimen: PATH Cytology Pleural fluid  Result Value Ref Range Status   Specimen Description  Final    PLEURAL Performed at [facility], [address] [place], [place] [address]    Special Requests   Final    NONE Performed at [facility], [place], [place] [address]    Gram Stain   Final    NO SQUAMOUS EPITHELIAL CELLS SEEN FEW WBC SEEN NO ORGANISMS SEEN    Culture   Final    NO GROWTH 3 DAYS Performed at [facility], [place] [address], [place], [place] [address]    Report Status 08/01/2021 FINAL  Final  Body fluid culture w Gram Stain     Status: None   Collection Time: 08/01/21  3:36 PM   Specimen: PATH Cytology Peritoneal fluid  Result Value Ref Range Status   Specimen Description   Final    PERITONEAL Performed at [facility], [address], [place], [place] [address]    Special Requests   Final    NONE Performed at [facility], [address] [place], [place] [address]    Gram Stain   Final    RARE WBC PRESENT,BOTH PMN AND MONONUCLEAR NO ORGANISMS SEEN    Culture   Final    NO GROWTH 3 DAYS Performed at [place], [place] [address] [place], [place] [address]    Report Status 08/05/2021 FINAL  Final  MRSA Next Gen by PCR, Nasal     Status: None   Collection Time: 08/04/21  8:56 PM   Specimen: Nasal Mucosa; Nasal Swab  Result Value Ref Range Status   MRSA by PCR Next Gen NOT DETECTED NOT DETECTED Final    Comment: (NOTE) The GeneXpert MRSA Assay (FDA approved for NASAL specimens only), is one component of a comprehensive MRSA colonization surveillance program. It is not intended to diagnose MRSA infection nor to guide or monitor treatment for MRSA infections. Test performance is not FDA approved in patients less than 60 years old. Performed at [facility], [address] [place], [place] [address]   Body fluid culture w Gram Stain     Status: None (Preliminary result)   Collection Time: 08/05/21  5:50 PM   Specimen: Pericardial  Result Value Ref Range Status   Specimen Description   Final    PERICARDIAL Performed at [facility], [address], [place], [place] [address]    Special Requests   Final    NONE Performed at [facility], [address] [place], [place] [address]    Gram Stain   Final    FEW WBC PRESENT, PREDOMINANTLY MONONUCLEAR NO ORGANISMS SEEN    Culture   Final    NO GROWTH 2 DAYS Performed at [facility], [place] [address] [place],  [address]    Report Status PENDING  Incomplete    Coagulation Studies: Recent Labs    08/05/21 0444  LABPROT 17.2*  INR 1.4*     Urinalysis: No results for input(s): COLORURINE, LABSPEC, PHURINE, GLUCOSEU, HGBUR, BILIRUBINUR, KETONESUR, PROTEINUR, UROBILINOGEN, NITRITE, LEUKOCYTESUR in the last 72 hours.  Invalid input(s): APPERANCEUR    Imaging: CARDIAC CATHETERIZATION  Result Date: 08/05/2021 Successful pericardiocentesis with placement of 6 French pigtail catheter into pericardial space with removal of 700 cc of bloody pericardial fluid   DG Chest Port 1 View  Result Date:  08/05/2021 CLINICAL DATA:  Acute onset of shortness of breath, initial encounter EXAM: PORTABLE CHEST 1 VIEW COMPARISON:  CT from the previous day. FINDINGS: Cardiac  shadow is prominent. Pericardial drainage catheter is now seen. Left-sided pleural effusion is noted increased when compare with the prior exam with associated left basilar atelectasis. Right lung remains clear. No bony abnormality is noted. IMPRESSION: Pericardial catheter in place. Enlarging left-sided effusion. Electronically Signed   By: Inez Catalina M.D.   On: 08/05/2021 20:01   ECHOCARDIOGRAM COMPLETE  Result Date: 08/05/2021    ECHOCARDIOGRAM REPORT   Patient Name:   Karen Farley Date of Exam: 08/05/2021 Medical Rec #:  [id number]          Height:       62.0 in Accession #:    [id number]         Weight:       223.3 lb Date of Birth:  1946-03-23           BSA:          2.003 m Patient Age:    75 years           BP:           89/53 mmHg Patient Gender: F                  HR:           107 bpm. Exam Location:  [facility] Procedure: 2D Echo, Cardiac Doppler and Color Doppler Indications:     Pericardial effusion I31.3  History:         Patient has prior history of Echocardiogram examinations. CHF;                  Risk Factors:Hypertension and Diabetes.  Sonographer:     Alyse Low Roar Referring Phys:  [id number] BRITTON L RUST-CHESTER Diagnosing Phys: Ida Rogue MD IMPRESSIONS  1. Left ventricular ejection fraction, by estimation, is 60 to 65%. The left ventricle has normal function. The left ventricle has no regional wall motion abnormalities. There is mild left ventricular hypertrophy. Left ventricular diastolic parameters are indeterminate.  2. Right ventricular systolic function is normal. The right ventricular size is normal. There is moderately elevated pulmonary artery systolic pressure. The estimated right ventricular systolic pressure is 78.2 mmHg.  3. Left atrial size was mildly dilated.  4. The mitral valve is normal in structure. Mild  mitral valve regurgitation.  5. The aortic valve was not well visualized,Calcification noted. Aortic valve regurgitation is mild. Mild to moderate aortic valve sclerosis/calcification is present, unable to exclude mild aortic valve regurgitation.  6. The inferior vena cava is dilated in size with <50% respiratory variability, suggesting right atrial pressure of 15 mmHg.  7. A large pericardial effusion is present. 2.74 to 3.39 cm circumferential, no significant RA or RV collapse. Mitral and tricuspid valve in-flow velocities not measured. Grossly, there does not appear to be tamponade. Of concern is the dilated IVC with  little respiratory variation and moderately elevated right heart pressures. FINDINGS  Left Ventricle: Left ventricular ejection fraction, by estimation, is 60 to 65%. The left ventricle has normal function. The left ventricle has no regional wall motion abnormalities. The left ventricular internal cavity size was normal in size. There is  mild left ventricular hypertrophy. Left ventricular diastolic parameters are indeterminate. Right Ventricle: The right ventricular size is normal. No increase in right ventricular wall thickness. Right ventricular systolic function is normal. There is moderately elevated pulmonary artery systolic pressure. The tricuspid regurgitant velocity is 3.14 m/s, and with an assumed right atrial pressure of 15 mmHg, the estimated right ventricular systolic pressure is 54.4  mmHg. Left Atrium: Left atrial size was mildly dilated. Right Atrium: Right atrial size was normal in size. Pericardium: A large pericardial effusion is present. Mitral Valve: The mitral valve is normal in structure. Mild mitral valve regurgitation. No evidence of mitral valve stenosis. Tricuspid Valve: The tricuspid valve is normal in structure. Tricuspid valve regurgitation is mild . No evidence of tricuspid stenosis. Aortic Valve: The aortic valve was not well visualized. Aortic valve regurgitation is  mild. Mild to moderate aortic valve sclerosis/calcification is present, without any evidence of aortic stenosis. Aortic valve mean gradient measures 13.0 mmHg. Aortic valve peak gradient measures 26.0 mmHg. Aortic valve area, by VTI measures 0.81 cm. Pulmonic Valve: The pulmonic valve was normal in structure. Pulmonic valve regurgitation is not visualized. No evidence of pulmonic stenosis. Aorta: The aortic root is normal in size and structure. Venous: The inferior vena cava is dilated in size with less than 50% respiratory variability, suggesting right atrial pressure of 15 mmHg. IAS/Shunts: No atrial level shunt detected by color flow Doppler.  LEFT VENTRICLE PLAX 2D LVIDd:         3.09 cm   Diastology LVIDs:         2.12 cm   LV e' medial:    7.51 cm/s LV PW:         0.78 cm   LV E/e' medial:  17.0 LV IVS:        1.02 cm   LV e' lateral:   6.80 cm/s LVOT diam:     1.70 cm   LV E/e' lateral: 18.8 LV SV:         33 LV SV Index:   17 LVOT Area:     2.27 cm  RIGHT VENTRICLE RV Mid diam:    2.15 cm RV S prime:     9.96 cm/s LEFT ATRIUM             Index LA diam:        4.37 cm 2.18 cm/m LA Vol (A2C):   27.9 ml 13.93 ml/m LA Vol (A4C):   29.0 ml 14.48 ml/m LA Biplane Vol: 30.8 ml 15.37 ml/m  AORTIC VALVE                     PULMONIC VALVE AV Area (Vmax):    0.93 cm      PV Vmax:        0.83 m/s AV Area (Vmean):   0.91 cm      PV Peak grad:   2.7 mmHg AV Area (VTI):     0.81 cm      RVOT Peak grad: 2 mmHg AV Vmax:           255.00 cm/s AV Vmean:          162.000 cm/s AV VTI:            0.410 m AV Peak Grad:      26.0 mmHg AV Mean Grad:      13.0 mmHg LVOT Vmax:         105.00 cm/s LVOT Vmean:        65.000 cm/s LVOT VTI:          0.147 m LVOT/AV VTI ratio: 0.36  AORTA Ao Root diam: 2.50 cm MITRAL VALVE                TRICUSPID VALVE MV Area (PHT): 3.83 cm     TR Peak grad:   39.4 mmHg MV  Decel Time: 198 msec     TR Vmax:        314.00 cm/s MV E velocity: 128.00 cm/s                             SHUNTS                              Systemic VTI:  0.15 m                             Systemic Diam: 1.70 cm Ida Rogue MD Electronically signed by Ida Rogue MD Signature Date/Time: 08/05/2021/3:49:45 PM    Final    ECHOCARDIOGRAM LIMITED  Result Date: 08/06/2021    ECHOCARDIOGRAM LIMITED REPORT   Patient Name:   BRIELLA HOBDAY Date of Exam: 08/06/2021 Medical Rec #:  [id number]          Height:       62.0 in Accession #:    [id number]         Weight:       224.9 lb Date of Birth:  1946/06/05           BSA:          2.009 m Patient Age:    14 years           BP:           100/27 mmHg Patient Gender: F                  HR:           92 bpm. Exam Location:  [facility] Procedure: Limited Echo, Limited Color Doppler and Cardiac Doppler Indications:     I31.3 Pericardial effusion  History:         Patient has prior history of Echocardiogram examinations, most                  recent 08/05/2021. HFpEF, CKD; Risk Factors:Hypertension,                  Diabetes and Dyslipidemia.  Sonographer:     Charmayne Sheer Referring Phys:  [phone] CHRISTOPHER END Diagnosing Phys: Nelva Bush MD  Sonographer Comments: Technically difficult study due to poor echo windows. Image acquisition challenging due to patient body habitus. IMPRESSIONS  1. Left ventricular ejection fraction, by estimation, is >55%. The left ventricle has normal function. Left ventricular endocardial border not optimally defined to evaluate regional wall motion.  2. Right ventricular systolic function is normal. The right ventricular size is normal. There is mildly elevated pulmonary artery systolic pressure.  3. The pericardial effusion has decreased in size since yesterday but still appears large. The largest collections lie posterolateral to the left ventricle.. There is no evidence of cardiac tamponade.  4. The inferior vena cava is dilated in size with >50% respiratory variability, suggesting right atrial pressure of 8 mmHg. FINDINGS  Left Ventricle: Left ventricular  ejection fraction, by estimation, is >55%. The left ventricle has normal function. Left ventricular endocardial border not optimally defined to evaluate regional wall motion. Right Ventricle: The right ventricular size is normal. Right ventricular systolic function is normal. There is mildly elevated pulmonary artery systolic pressure. The tricuspid regurgitant velocity is 3.01 m/s, and with an assumed right atrial pressure of 8 mmHg, the estimated right ventricular systolic pressure is  44.2 mmHg. Pericardium: The pericardial effusion has decreased in size since yesterday but still appears large. The largest collections lie posterolateral to the left ventricle. There is no evidence of cardiac tamponade. Venous: The inferior vena cava is dilated in size with greater than 50% respiratory variability, suggesting right atrial pressure of 8 mmHg. LEFT VENTRICLE PLAX 2D LVIDd:         3.60 cm LVIDs:         2.60 cm LV PW:         1.10 cm LV IVS:        0.90 cm  LEFT ATRIUM         Index LA diam:    4.10 cm 2.04 cm/m  TRICUSPID VALVE TR Peak grad:   36.2 mmHg TR Vmax:        301.00 cm/s Nelva Bush MD Electronically signed by Nelva Bush MD Signature Date/Time: 08/06/2021/10:42:23 AM    Final      Medications:    sodium chloride Stopped (08/05/21 1441)   albumin human 60 mL/hr at 08/07/21 0900   phenylephrine (NEO-SYNEPHRINE) Adult infusion Stopped (08/07/21 1207)    Chlorhexidine Gluconate Cloth  6 each Topical Daily   folic acid  1 mg Oral Daily   insulin aspart  0-5 Units Subcutaneous QHS   insulin aspart  0-9 Units Subcutaneous TID WC   iron polysaccharides  150 mg Oral Daily   latanoprost  1 drop Both Eyes QHS   midodrine  10 mg Oral TID WC   pantoprazole (PROTONIX) IV  40 mg Intravenous Q12H   Ensure Max Protein  11 oz Oral BID   vitamin B-12  500 mcg Oral Daily     Assessment/ Plan:  Ms. DORRIS PIERRE is a 75 y.o. white female with diastolic congestive heart failure,  hypertension, hyperlipidemia, lymphedema, atrial fibrillation, peripheral vascular disease who is admitted to [facility] on 07/26/2021 for Morbid obesity ([place]) [E66.01] DOE (dyspnea on exertion) [R06.09] Recurrent left pleural effusion [J90] Hypotension [I95.9] Type 2 diabetes mellitus without complication, without long-term current use of insulin ([facility]) [E11.9] Atrial fibrillation, unspecified type ([place]) [I48.91] Chronic congestive heart failure, unspecified heart failure type ([facility]) [I50.9]  Acute kidney injury on chronic kidney disease stage IIIB: with baseline creatinine of 1.66, GFR of 36 on 07/13/21. History of bland urine. 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 Hold benazepril and spironolactone.  Attempted IV lasix without sustained success.Daily weights indicate steady increase in weight daily. Transitioned to Lasix drip at 6mg /hr.with Albumin twice daily.  Creatinine at baseline -Continue to hold diuretics until hemodynamically stable  Hypotension: BP improved to 102/74, remains on phenylephrine for BP support  Acute exacerbation of diastolic congestive heart failure:  - Cardiology following -Diuretic initiation defer to cardiology team -Fluid restriction - Echo this a.m shows EF 55%  Hyponatremia: with hypervolumia.   -Resolved - Continue fluid restriction.   5. Left pleural effusion: Thoracentesis on 07/28/21 with 610ml removed. Paracentesis on 08/01/21 with 1.2L removed. Solucortef ordered.  Large pleural effusion seen on echo.  Pulmonology following  6. Pericardial effusion- Pericardiocentesis performed on 08/05/21 with 1.1L removed.  Repeat echo this a.m. shows a small to moderate pericardial effusion   LOS: 12 Jermayne Sweeney 10/18/202212:12 PM

## 2021-08-07 NOTE — Progress Notes (Signed)
NAME:  Karen Farley, MRN:  [id number], DOB:  06/05/1946, LOS: 42 ADMISSION DATE:  07/26/2021 CONSULTATION DATE:  08/04/2021 REFERRING MD:  Dr. Damita Dunnings, CHIEF COMPLAINT:  SOB & edema (on admit)   75 y/o female history HFpEF, Afib on Eliquis, PAD, DM, CKDIII, lymphedema, HTN admitted to hospital 10/6 with sob, orthopnea, weight gain with hypotension, large left pleural effusion, generalized anasarca on admission. S/p left thoracentesis 10/8 610ml removed and paracentesis 10/12 1.2L removed. Failed Lasix IV advanced to Lasix gtt; however, Lasix and Eliquis stopped 10/15 and patient transferred to ICU after had episode of GI bleed with drop in hgb, s/p 2U pRBC and vasopressor support initiated. Echo done 10/16 showed large pericardial effusion s/p pericardiocentesis 758mL fluid removed with drain placed with decreasing fluid output since placement. Repeat echo daily, followed by cardiology for updated plan of drain and diuresis.   History of Present Illness:  75 year old female presenting to [facility] ED from home on 07/26/21 with complaints of worsening dyspnea on exertion, orthopnea & 5 pound weight gain over the past 5-7 days.  ED course: Per ED documentation patient appeared clinically dehydrated with acute hyponatremia, hypokalemia & elevated BUN.  Chest x-ray showed persistent left-sided pleural effusion. Medications given: She received ceftriaxone azithromycin and gentle IV fluids.  Patient was admitted to hospitalist service. Initial Vitals: Afebrile at 98.2, RR 20, A. fib at 73, hypotensive 84/62 & SPO2 98% on room air Significant labs: (Labs/ Imaging personally reviewed) Chemistry: Na+:, 128 K+: 2.6, BUN/Cr.:  65/1.78, Serum CO2/ AG: 34/15 Hematology: WBC: 8.1, Hgb: 13.6,  Troponin: 17, BNP: 154.2, COVID-19 & Influenza A/B: Negative CXR 07/26/2021: Large left-sided pleural effusion and trace right pleural effusion. cardiomegaly with vascular congestion   Hospital course: Pulmonology consulted  for large left pleural effusion status post thoracentesis on 07/28/21 with removal of 600 mL.  Nephrology consulted to help manage AKI on CKD stage IIIb.  IV Lasix was attempted without sustained success, daily weights continued to increase and patient was transitioned to Lasix drip at 6 mg an hour with albumin twice daily & midodrine. On 08/04/21 patient was noted to have rectal bleeding, GI consulted and Eliquis was discontinued. Plan for colonoscopy/EGD after 48 hours. Patient had additional rectal bleeding episodes with correlating drop in Hgb 2 points, hypotension/tachycardia & lethargy with rapid transfer to ICU needing blood transfusion, STAT CT angiogram and vasopressor administration.   PCCM consulted for admission due to hemorrhagic shock & vasopressor administration.   CT angiogram 08/04/21 > In the rectum on the arterial phase images there is a focal area of contrast extravasation (best seen on image # 225 of series 5 & image # 135, 134 of series 10. Subsequent venous phase images show this to be more diffusely distributed within the rectum consistent with focal GI bleeding. No other area of contrast extravasation is seen. No other pooling of contrast is noted within the colon. Scattered diverticular change is seen without evidence of diverticulitis. Small bowel and stomach appear within normal limits.  Pertinent  Medical History  HFpEF Chronic atrial fibrillation on Eliquis PAD Type 2 diabetes mellitus CKD stage IIIb Lymphedema HTN HLD DDD  Significant Hospital Events: Including procedures, antibiotic start and stop dates in addition to other pertinent events   07/28/2021-pulmonology consulted & thoracentesis removed 600 mL 07/29/2021-nephrology following managing diuretics, AKI on CKD and electrolyte imbalances 08/01/2021-paracentesis done, 1.2L fluid removed 08/04/2021-patient had an episode of melena on day shift, GI was consulted & Eliquis discontinued. Overnight patient had  additional rectal  bleeding with correlating lethargy/ tachycardia/ hypotension. Patient was a rapid transfer to ICU: CTa abdomen/pelvis & blood. Patient remains hypotensive after IVF bolus while waiting for blood transfusion, PCCM consulted for vasopressor management. 08/05/2021-echocardiogram confirmed large pericardial effusion patient taken to Cath Lab for pericardiocentesis removing 700 mL of bloody drainage, returning to ICU with drain in place.  Patient remains on vasopressor support, "a little" rectal blood per nursing during the day, flexible sigmoidoscopy on hold due to pericardiocentesis.  Hemoglobin remained stable. 08/06/21- remains hypotensive on vasopressors, diuresis on hold, large volume output from pericardial drain cytology pending 08/07/21-remains on low-dose vasopressors, still with significant output from pericardial drain   Interim History / Subjective:  Patient seen sitting up in bed with husband at bedside. Reports her shortness of breath is somewhat improved today, that she "has more space to breathe" than she has had. Still with persistent dry, irritating cough which patient reports is chronic but increased from baseline frequency. + chest soreness no chest pain, no abdominal pain, nausea, vomiting. Decreased appetite but tolerating clears and protein shakes. Has not had a BM since GI bleed episode several days ago; however, is passing flatus.  Still with pericardial drain present left chest, dressing c/d/i.  + generalized edema 2+ peripheral edema bilateral hands/wrists bilateral legs which patient reports overall unchanged compared to yesterday. Bilateral legs wrapped in ace bandages   Objective   Blood pressure (!) 97/41, pulse 99, temperature 97.7 F (36.5 C), temperature source Oral, resp. rate (!) 32, height 5\' 2"  (1.575 m), weight 102 kg, SpO2 97 %.     Intake/Output Summary (Last 24 hours) at 08/07/2021 0847 Last data filed at 08/07/2021 0600 Gross per 24 hour   Intake 1205.91 ml  Output 900 ml  Net 305.91 ml   Filed Weights   08/04/21 2020 08/05/21 0413 08/06/21 0253  Weight: 101.7 kg 101.3 kg 102 kg      REVIEW OF SYSTEMS Review of Systems  Constitutional:  Positive for malaise/fatigue. Negative for chills, fever and weight loss.  HENT: Negative.    Respiratory:  Positive for cough and shortness of breath. Negative for hemoptysis, sputum production and wheezing.        Persistent, dry, nonproductive cough  Cardiovascular:  Negative for chest pain, palpitations, orthopnea and leg swelling.       Chest soreness at pericardial drain site, bilateral hand/wrist swelling  Gastrointestinal:  Negative for abdominal pain, blood in stool, constipation, diarrhea, melena, nausea and vomiting.  Genitourinary: Negative.   Musculoskeletal: Negative.   Skin: Negative.   Neurological:  Positive for weakness. Negative for dizziness, sensory change, loss of consciousness and headaches.  Psychiatric/Behavioral: Negative.     PHYSICAL EXAMINATION: General: Adult female, acutely ill, lying in bed, NAD  HEENT: MM pink/moist, anicteric, atraumatic, neck supple Neuro: A&O x 4, able to follow commands, PERRL +3, MAE-generalized weakness CV: s1s2 irregular, controlled atrial fibrillation on monitor, no r/m/g Pulm: Regular, non labored on 2 L nasal cannula, breath sounds clear-BUL & diminished-BLL GI: soft, rounded, non tender, bs x 4 GU: pure wick in place  with clear amber/yellow urine Skin: Scattered ecchymosis, BLE wrapped due to lymphedema and blisters(per patient), pericardial drain noted with gauze dressing clean dry and intact Extremities: warm/dry, pulses + 2 R/P, +3 edema noted BLE  Labs/imaging that I havepersonally reviewed  (right click and "Reselect all SmartList Selections" daily)  CXR 10/16>> Left pleural effusion enlarged as compared to prior chest imaging with left basilar atelectasis Echo 10/17>>LVEF 55%, pericardial effusion has  decreased  in size since yesterday but still appears large. The largest collections lie posterolateral to the left ventricle. No evidence of cardiac tamponade.  Echo 10/18>> pending    ASSESSMENT AND PLAN  75 y/o female history HFpEF, Afib on Eliquis, PAD, DM, CKDIII, lymphedema, HTN admitted to hospital 10/6 with sob, orthopnea, weight gain with hypotension, large left pleural effusion, generalized anasarca on admission. S/p left thoracentesis 10/8 6102ml removed and paracentesis 10/12 1.2L removed. Failed Lasix IV advanced to Lasix gtt; however, Lasix and Eliquis stopped 10/15 and patient transferred to ICU after had episode of GI bleed with drop in hgb, s/p 2U pRBC and vasopressor support initiated. Echo done 10/16 showed large pericardial effusion s/p pericardiocentesis 754mL fluid removed with drain placed with decreasing fluid output since placement. Repeat echo daily, followed by cardiology for updated plan of drain and diuresis.   Suspected lower Acute Gastrointestinal Bleed in the setting of Eliquis Circulatory shock secondary to acute blood loss PMHx: Atrial Fibrillation on Eliquis Last major episode of rectal bleeding 08/04/21 around 22:00. Hemoglobin has remained stable s/p 2 units PRBCs.  Flexible sigmoidoscopy deferred on 08/05/21 due to large pericardial effusion requiring pericardiocentesis. - Eliquis stopped after 9 am dose 10/15, remains on hold - Protonix twice daily - Maintain active type & screen, serial H&H - Monitor for s/s of bleeding - Daily CBC, PT/INR monitoring PRN - Transfuse for Hgb <7 - GI consulted, appreciate input. Diet per GI recs - Continue midodrine 10mg  TID and albumin IV - Remains on Phenylephrine drip, wean as tolerated to maintain MAP > 65 - Bowel care: Miralax prn   Acute on Chronic HFpEF exacerbation Pericardial Effusion s/p pericardiocentesis and drain placement (08/05/21) Cardiogenic shock Anasarca PMHx: HFpEF ECHO 04/2021: LVEF 60 to 65%, , no pericardial  effusion  CTa 08/04/21: Revealed large pericardial effusion Echo 08/05/2021: LVEF 60 to 65%, IVC dilated, large pericardial effusion Echo 10/17>>LVEF 55%, pericardial effusion has decreased in size since yesterday but still appears large. The largest collections lie posterolateral to the left ventricle. No evidence of cardiac tamponade.  BNP: 154.2 > 184.5 S/p urgent pericardiocentesis 10/16 removing 700 mL of bloody drainage with pericardial drain placed - Monitor pericardial drain output; DC per cardiology recs - Lasix drip on hold, consider restarting once patient has stabilized and is off vasopressor support - Cardiology consulted, appreciate input - Wean pressors as tol to DC; BP support with midodrine as above - Supplemental oxygen as needed to maintain SPO2 > 90%   Left pleural effusion status post thoracentesis (07/28/21) CXR 08/05/21: Pericardial catheter in place, (read as) enlarging left-sided effusion > when looking at CXR post thora however, pleural effusion appears unchanged - Supplemental oxygen PRN, to maintain SPO2 > 90% - Intermittent CXR & ABG PRN - Patient appears stable from a respiratory standpoint, consider additional intervention PRN   Acute Kidney Injury superimposed on CKD stage IIIb in the setting of acute on chronic HFpEF exacerbation & anasarca-resolved Hypokalemia Hyponatremia-resolved Baseline Cr: 1.66, Cr on admission: 1.78 Creatinine has returned to baseline -Continue fluid restriction  -Strict I's and O's -Lasix drip on hold, see above, continue albumin IV -Daily BMP, replace electrolytes as needed -Avoid nephrotoxic agents as able, ensure adequate renal perfusion -Nephrology following, appreciate input   Intake/Output Summary (Last 24 hours) at 08/07/2021 0850 Last data filed at 08/07/2021 0600 Gross per 24 hour  Intake 1205.91 ml  Output 900 ml  Net 305.91 ml   Chronic Atrial Fibrillation-controlled CHA2DS2-VASc score: 6 Per patient report,  she was changed to Eliquis from [place] in July 2022 -Outpatient metoprolol discontinued in the setting of ongoing vasopressor requirement, restart as BP allows -Eliquis remains on hold due to GI bleed -Continuous cardiac monitoring -Cardiology following, appreciate input   Type 2 Diabetes Mellitus Steroid Induced Hyperglycemia- steroid discontinued 10/14 Hemoglobin A1C: 6.6 -Monitor CBG Q ACHS -SSI sensitive dosing -Target range while in ICU: 140-180 -Follow ICU hyper/hypoglycemia protocol -Diabetes coordinator consulted appreciate input   Iron and vitamin B12 deficiency -Per primary service: Continue Venofer followed by oral supplementation at discharge -Continue vitamin B12 supplementation daily   Best practice (right click and "Reselect all SmartList Selections" daily)  Diet: NPO Pain/Anxiety/Delirium protocol (if indicated): No VAP protocol (if indicated): Not indicated DVT prophylaxis: SCD GI prophylaxis: PPI Central venous access:  N/A Arterial line:  N/A Foley:  N/A Mobility:  bed rest  Code Status:  FULL Disposition:ICU  Labs   CBC: Recent Labs  Lab 08/01/21 0434 08/02/21 0432 08/04/21 0504 08/04/21 1233 08/04/21 1845 08/05/21 0444 08/05/21 1106 08/05/21 1706 08/05/21 2308 08/06/21 0510 08/06/21 2159 08/07/21 0552  WBC 11.3*   < > 9.0 10.9*  --  13.1*  --   --   --  14.6*  --  13.4*  NEUTROABS 9.9*  --   --   --   --   --   --   --   --  10.8*  --   --   HGB 13.6   < > 12.4 9.7*   < > 11.2*   < > 10.3* 9.9* 10.3* 9.3* 9.4*  HCT 41.9   < > 38.4 30.0*   < > 32.8*   < > 31.4* 29.4* 31.9* 29.5* 29.1*  MCV 75.9*   < > 74.9* 75.9*  --  80.2  --   --   --  86.2  --  86.6  PLT 398   < > 311 288  --  244  --   --   --  227  --  205   < > = values in this interval not displayed.    Basic Metabolic Panel: Recent Labs  Lab 08/03/21 0454 08/04/21 0504 08/05/21 0444 08/06/21 0510 08/07/21 0552  NA 135 136 140 143 144  K 4.1 3.8 4.0 3.4* 3.4*  CL 95* 93*  100 104 104  CO2 29 31 30 28  33*  GLUCOSE 280* 214* 244* 148* 147*  BUN 60* 62* 87* 67* 51*  CREATININE 1.75* 1.75* 1.55* 1.43* 1.30*  CALCIUM 9.2 8.9 8.9 9.3 9.2  MG 2.7* 2.2 2.1 2.2 2.1  PHOS 3.3 2.7 3.7 3.5 3.1   GFR: Estimated Creatinine Clearance: 41.9 mL/min (A) (by C-G formula based on SCr of 1.3 mg/dL (H)). Recent Labs  Lab 08/04/21 1233 08/05/21 0444 08/06/21 0510 08/07/21 0552  WBC 10.9* 13.1* 14.6* 13.4*    Liver Function Tests: Recent Labs  Lab 08/06/21 0510  AST 21  ALT 15  ALKPHOS 40  BILITOT 2.6*  PROT 5.3*  ALBUMIN 3.6   No results for input(s): LIPASE, AMYLASE in the last 168 hours. No results for input(s): AMMONIA in the last 168 hours.  ABG    Component Value Date/Time   HCO3 18.8 (L) 04/11/2021 0210   ACIDBASEDEF 8.7 (H) 04/11/2021 0210   O2SAT 16.3 04/11/2021 0210     Coagulation Profile: Recent Labs  Lab 08/05/21 0444  INR 1.4*    Cardiac Enzymes: No results for input(s): CKTOTAL, CKMB, CKMBINDEX, TROPONINI in the last 168 hours.  HbA1C: Hemoglobin A1C  Date/Time Value Ref Range Status  06/28/2016 12:00 AM 6.6  Final   HB A1C (BAYER DCA - WAIVED)  Date/Time Value Ref Range Status  07/13/2021 04:37 PM 6.9 (H) 4.8 - 5.6 % Final    Comment:             Prediabetes: 5.7 - 6.4          Diabetes: >6.4          Glycemic control for adults with diabetes: <7.0               **Please note reference interval change**   04/25/2021 11:34 AM 6.3 <7.0 % Final    Comment:                                          Diabetic Adult            <7.0                                       Healthy Adult        4.3 - 5.7                                                           (DCCT/NGSP) American Diabetes Association's Summary of Glycemic Recommendations for Adults with Diabetes: Hemoglobin A1c <7.0%. More stringent glycemic goals (A1c <6.0%) may further reduce complications at the cost of increased risk of hypoglycemia.    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   Surgical History:   Past Surgical History:  Procedure Laterality Date   CHOLECYSTECTOMY     DILATION AND CURETTAGE OF UTERUS     PERICARDIOCENTESIS N/A 08/05/2021   Procedure: PERICARDIOCENTESIS;  Surgeon: Isaias Cowman, MD;  Location: [place] [facility];  Service: Cardiovascular;  Laterality: N/A;   TEAR DUCT PROBING WITH STRABISMUS REPAIR Right    TONSILLECTOMY       Social History:   reports that she has never smoked. She has never used smokeless tobacco. She reports that she does not currently use alcohol. She reports that she does not use drugs.   Family History:  Her family history includes Breast cancer in her maternal grandmother and mother; Cancer in her brother and  brother; Heart disease in her brother and mother; Heart disease (age of onset: 36) in her father; Osteoporosis in her mother; Parkinson's disease in her brother.   Allergies No Known Allergies   Home Medications  Prior to Admission medications   Medication Sig Start Date End Date Taking? Authorizing Provider  ACCU-CHEK GUIDE test strip USE TO CHECK BLOOD SUGAR DAILY 12/18/20  Yes Johnson, [name], DO  apixaban (ELIQUIS) 5 MG TABS tablet Take  1 tablet (5 mg total) by mouth 2 (two) times daily. 04/18/21  Yes Fritzi Mandes, MD  latanoprost (XALATAN) 0.005 % ophthalmic solution Place 1 drop into both eyes at bedtime.   Yes [provider]  linagliptin (TRADJENTA) 5 MG TABS tablet Take 1 tablet (5 mg total) by mouth daily. 07/13/21  Yes Johnson, [name], DO  metoprolol succinate (TOPROL-XL) 50 MG 24 hr tablet TAKE 1 TABLET(50 MG) BY MOUTH DAILY WITH OR IMMEDIATELY FOLLOWING A MEAL 04/30/21  Yes Johnson, [name], DO  Multiple Vitamin (MULTIVITAMIN WITH MINERALS) TABS tablet Take 1 tablet by mouth daily.   Yes [provider]  torsemide (DEMADEX) 20 MG tablet Take 20 mg by mouth daily.   Yes [provider]  Elastic Bandages & Supports ([place]) [place] by Does not apply route.    Johnson, [name], DO  metolazone (ZAROXOLYN) 2.5 MG tablet Take 1 tablet (2.5 mg total) by mouth daily. 05/27/21 05/27/21  Nolberto Hanlon, MD  spironolactone (ALDACTONE) 25 MG tablet Take 1 tablet (25 mg total) by mouth daily. 05/28/21 05/27/21  Nolberto Hanlon, MD       DVT/GI PRX  assessed I Assessed the need for Labs I Assessed the need for Foley I Assessed the need for Central Venous Line Family Discussion when available I Assessed the need for Mobilization I made an Assessment of medications to be adjusted accordingly Safety Risk assessment completed  CASE DISCUSSED IN MULTIDISCIPLINARY ROUNDS WITH ICU TEAM     Critical Care Time devoted to patient care services described in this note is 50 minutes.   Critical care was necessary to treat /prevent imminent and life-threatening deterioration.   PATIENT WITH VERY POOR PROGNOSIS I ANTICIPATE PROLONGED ICU LOS  Patient is critically ill. Patient with Multiorgan failure and at high risk for cardiac arrest and death.    Corrin Parker, M.D.  [facility]  Medical Director [place] Director [facility] Cardio-Pulmonary  Department

## 2021-08-07 NOTE — Progress Notes (Signed)
Progress Note  Patient Name: Karen Farley Date of Encounter: 08/07/2021  [facility] Cardiologist: Kate Sable, MD   Subjective   Breathing a little bit better today.  Has an unusual sensation in her chest as if less constriction or fullness around her heart.  Pericardial drain has put out 250 mL since yesterday afternoon.  Inpatient Medications    Scheduled Meds:  Chlorhexidine Gluconate Cloth  6 each Topical Daily   folic acid  1 mg Oral Daily   insulin aspart  0-5 Units Subcutaneous QHS   insulin aspart  0-9 Units Subcutaneous TID WC   iron polysaccharides  150 mg Oral Daily   latanoprost  1 drop Both Eyes QHS   midodrine  10 mg Oral TID WC   pantoprazole (PROTONIX) IV  40 mg Intravenous Q12H   Ensure Max Protein  11 oz Oral BID   vitamin B-12  500 mcg Oral Daily   Continuous Infusions:  sodium chloride Stopped (08/05/21 1441)   albumin human 12.5 g (08/07/21 1739)   phenylephrine (NEO-SYNEPHRINE) Adult infusion Stopped (08/07/21 1207)   PRN Meds: acetaminophen, lidocaine HCl (PF), lip balm, polyethylene glycol   Vital Signs    Vitals:   08/07/21 1615 08/07/21 1630 08/07/21 1700 08/07/21 1715  BP: (!) 108/50 (!) 109/59 114/82 (!) 90/42  Pulse:  95 95 93  Resp: (!) 25 20 (!) 23 (!) 22  Temp:      TempSrc:      SpO2:  99% 100% 100%  Weight:      Height:        Intake/Output Summary (Last 24 hours) at 08/07/2021 1753 Last data filed at 08/07/2021 1600 Gross per 24 hour  Intake 742.54 ml  Output 1950 ml  Net -1207.46 ml   Last 3 Weights 08/06/2021 08/05/2021 08/04/2021  Weight (lbs) 224 lb 13.9 oz 223 lb 5.2 oz 224 lb 3.3 oz  Weight (kg) 102 kg 101.3 kg 101.7 kg      Telemetry    Atrial fibrillation with ventricular rates 80-110 bpm.- Personally Reviewed  ECG    No new tracing  Physical Exam   GEN: No acute distress.   Neck: Difficult to assess JVP due to body habitus. Cardiac: RRR, no murmurs, rubs, or gallops.  Respiratory:  Diminished breath sounds at both lung bases. GI: Soft, nontender, non-distended  MS: Both calves wrapped.  Anasarca noted; No deformity. Neuro:  Nonfocal  Psych: Normal affect   Labs    High Sensitivity Troponin:   Recent Labs  Lab 07/26/21 1708  TROPONINIHS 17     Chemistry Recent Labs  Lab 08/05/21 0444 08/06/21 0510 08/07/21 0552  NA 140 143 144  K 4.0 3.4* 3.4*  CL 100 104 104  CO2 30 28 33*  GLUCOSE 244* 148* 147*  BUN 87* 67* 51*  CREATININE 1.55* 1.43* 1.30*  CALCIUM 8.9 9.3 9.2  MG 2.1 2.2 2.1  PROT  --  5.3*  --   ALBUMIN  --  3.6  --   AST  --  21  --   ALT  --  15  --   ALKPHOS  --  40  --   BILITOT  --  2.6*  --   GFRNONAA 35* 38* 43*  ANIONGAP 10 11 7     Lipids No results for input(s): CHOL, TRIG, HDL, LABVLDL, LDLCALC, CHOLHDL in the last 168 hours.  Hematology Recent Labs  Lab 08/05/21 0444 08/05/21 1106 08/06/21 0510 08/06/21 2159 08/07/21 2774  WBC 13.1*  --  14.6*  --  13.4*  RBC 4.09  --  3.70*  --  3.36*  HGB 11.2*   < > 10.3* 9.3* 9.4*  HCT 32.8*   < > 31.9* 29.5* 29.1*  MCV 80.2  --  86.2  --  86.6  MCH 27.4  --  27.8  --  28.0  MCHC 34.1  --  32.3  --  32.3  RDW 17.2*  --  19.8*  --  21.2*  PLT 244  --  227  --  205   < > = values in this interval not displayed.   Thyroid  Recent Labs  Lab 08/01/21 0434  TSH 0.902    BNP Recent Labs  Lab 08/05/21 0444  BNP 184.5*    DDimer No results for input(s): DDIMER in the last 168 hours.   Radiology    DG Chest Port 1 View  Result Date: 08/05/2021 CLINICAL DATA:  Acute onset of shortness of breath, initial encounter EXAM: PORTABLE CHEST 1 VIEW COMPARISON:  CT from the previous day. FINDINGS: Cardiac shadow is prominent. Pericardial drainage catheter is now seen. Left-sided pleural effusion is noted increased when compare with the prior exam with associated left basilar atelectasis. Right lung remains clear. No bony abnormality is noted. IMPRESSION: Pericardial catheter in place.  Enlarging left-sided effusion. Electronically Signed   By: Inez Catalina M.D.   On: 08/05/2021 20:01   ECHOCARDIOGRAM LIMITED  Result Date: 08/07/2021    ECHOCARDIOGRAM LIMITED REPORT   Patient Name:   Karen Farley Date of Exam: 08/07/2021 Medical Rec #:  [id number]          Height:       62.0 in Accession #:    [id number]         Weight:       224.9 lb Date of Birth:  [birth year]           BSA:          2.009 m Patient Age:    75 years           BP:           93/69 mmHg Patient Gender: F                  HR:           92 bpm. Exam Location:  [facility] Procedure: Limited Echo, Limited Color Doppler and Cardiac Doppler Indications:     I31.3 Pericardial effusion  History:         Patient has prior history of Echocardiogram examinations, most                  recent 08/06/2021. HFpEF, CKD; Risk Factors:Diabetes,                  Hypertension and Dyslipidemia.  Sonographer:     Charmayne Sheer Referring Phys:  [phone] Palak Tercero Diagnosing Phys: Nelva Bush MD  Sonographer Comments: Suboptimal apical window and suboptimal subcostal window. IMPRESSIONS  1. Left ventricular ejection fraction, by estimation, is >55%. The left ventricle has normal function.  2. Right ventricular systolic function is normal. The right ventricular size is normal. There is moderately elevated pulmonary artery systolic pressure.  3. There is a predominantly posterior small-moderate pericardial effusion. Echogenic material in the pericardial space may reflect epicardial fat, thrombus, or other soft tissue. There is no evidence of cardiac tamponade. Large pleural effusion.  4. The inferior vena cava is  normal in size with <50% respiratory variability, suggesting right atrial pressure of 8 mmHg. FINDINGS  Left Ventricle: Left ventricular ejection fraction, by estimation, is >55%. The left ventricle has normal function. Right Ventricle: The right ventricular size is normal. Right ventricular systolic function is normal. There is moderately  elevated pulmonary artery systolic pressure. The tricuspid regurgitant velocity is 3.54 m/s, and with an assumed right atrial pressure of 8 mmHg, the estimated right ventricular systolic pressure is 58.0 mmHg. Pericardium: There is a predominantly posterior small-moderate pericardial effusion. Echogenic material in the pericardial space may reflect epicardial fat, thrombus, or other soft tissue. There is no evidence of cardiac tamponade. Venous: The inferior vena cava is normal in size with less than 50% respiratory variability, suggesting right atrial pressure of 8 mmHg. Additional Comments: There is a large pleural effusion. LEFT VENTRICLE PLAX 2D LVIDd:         3.98 cm LVIDs:         3.13 cm LV PW:         0.75 cm LV IVS:        1.00 cm  LEFT ATRIUM         Index LA diam:    4.80 cm 2.39 cm/m  TRICUSPID VALVE TR Peak grad:   50.1 mmHg TR Vmax:        354.00 cm/s Nelva Bush MD Electronically signed by Nelva Bush MD Signature Date/Time: 08/07/2021/12:30:40 PM    Final (Updated)    ECHOCARDIOGRAM LIMITED  Result Date: 08/06/2021    ECHOCARDIOGRAM LIMITED REPORT   Patient Name:   Karen Farley Date of Exam: 08/06/2021 Medical Rec #:  [id number]          Height:       62.0 in Accession #:    [id number]         Weight:       224.9 lb Date of Birth:  [birth year]           BSA:          2.009 m Patient Age:    48 years           BP:           100/27 mmHg Patient Gender: F                  HR:           92 bpm. Exam Location:  [facility] Procedure: Limited Echo, Limited Color Doppler and Cardiac Doppler Indications:     I31.3 Pericardial effusion  History:         Patient has prior history of Echocardiogram examinations, most                  recent 08/05/2021. HFpEF, CKD; Risk Factors:Hypertension,                  Diabetes and Dyslipidemia.  Sonographer:     Charmayne Sheer Referring Phys:  [phone] Malique Driskill Diagnosing Phys: Nelva Bush MD  Sonographer Comments: Technically difficult study due to poor echo  windows. Image acquisition challenging due to patient body habitus. IMPRESSIONS  1. Left ventricular ejection fraction, by estimation, is >55%. The left ventricle has normal function. Left ventricular endocardial border not optimally defined to evaluate regional wall motion.  2. Right ventricular systolic function is normal. The right ventricular size is normal. There is mildly elevated pulmonary artery systolic pressure.  3. The pericardial effusion has decreased in size since yesterday but still  appears large. The largest collections lie posterolateral to the left ventricle.. There is no evidence of cardiac tamponade.  4. The inferior vena cava is dilated in size with >50% respiratory variability, suggesting right atrial pressure of 8 mmHg. FINDINGS  Left Ventricle: Left ventricular ejection fraction, by estimation, is >55%. The left ventricle has normal function. Left ventricular endocardial border not optimally defined to evaluate regional wall motion. Right Ventricle: The right ventricular size is normal. Right ventricular systolic function is normal. There is mildly elevated pulmonary artery systolic pressure. The tricuspid regurgitant velocity is 3.01 m/s, and with an assumed right atrial pressure of 8 mmHg, the estimated right ventricular systolic pressure is 19.6 mmHg. Pericardium: The pericardial effusion has decreased in size since yesterday but still appears large. The largest collections lie posterolateral to the left ventricle. There is no evidence of cardiac tamponade. Venous: The inferior vena cava is dilated in size with greater than 50% respiratory variability, suggesting right atrial pressure of 8 mmHg. LEFT VENTRICLE PLAX 2D LVIDd:         3.60 cm LVIDs:         2.60 cm LV PW:         1.10 cm LV IVS:        0.90 cm  LEFT ATRIUM         Index LA diam:    4.10 cm 2.04 cm/m  TRICUSPID VALVE TR Peak grad:   36.2 mmHg TR Vmax:        301.00 cm/s Nelva Bush MD Electronically signed by Nelva Bush MD Signature Date/Time: 08/06/2021/10:42:23 AM    Final     Cardiac Studies   See echo as above  Patient Profile     75 y.o. female with history of permanent atrial fibrillation, hypertension, chronic HFpEF, chronic kidney disease stage IIIa, and hyperlipidemia, admitted with concern for GI bleed and found to have hypotension and large pericardial effusion status post pericardiocentesis.  Assessment & Plan    Pericardial effusion: Limited echo today shows significant reduction in size of pericardial effusion, though some fluid is still present posterior to the left ventricle.  Drain has put out approximately 250 mL over the last 24 hours.  I recommend keeping drain in overnight to negative suction and repeating a limited echo tomorrow.  I would favor removal of drain, even if output remains elevated (greater than 100 mL per 24 hours) due to risk for infection with prolonged drain to well time.  If significant reaccumulation occurs, cardiac surgery will need to be consulted for placement of a pericardial window.  Etiology of pleural and pericardial effusions remains unclear at this time.  Ultimately, right heart cath may need to be considered to better understand the patient's volume status.  She has anasarca; will check urinalysis to exclude significant proteinuria.  I wonder if she may have had inflammatory or idiopathic pericarditis with hemorrhage into the pericardial space in the setting of anticoagulation.  Permanent atrial fibrillation: Ventricular rates reasonably well controlled despite lack of AV nodal blocking agents and the patient's critical illness.  Anticoagulation on hold in the setting of hemorrhagic pericardial effusion.  GI bleed: Hemoglobin stable.  GI following with plans for endoscopy once hemodynamically stable.  Shock: Blood pressure still low albeit now off vasopressor support.  Ongoing management per CCM.  For questions or updates, please contact [place] Please consult [email or web address] for contact info under [facility].     Signed, Nelva Bush, MD  08/07/2021, 5:53 PM

## 2021-08-07 NOTE — Progress Notes (Signed)
GI Inpatient Follow-up Note  Subjective:  Patient seen in follow-up for presumed LGI bleeding. No acute events overnight. She is present today with her husband and brother. She had repeat echocardiogram this morning. No further episodes of rectal bleeding. No bowel movements since her "blow out" over the weekend. She denies any nausea, vomiting, abdominal pain. She is still requiring pressors. Hemoglobin 9.4 this morning.   Scheduled Inpatient Medications:   Chlorhexidine Gluconate Cloth  6 each Topical Daily   folic acid  1 mg Oral Daily   insulin aspart  0-5 Units Subcutaneous QHS   insulin aspart  0-9 Units Subcutaneous TID WC   iron polysaccharides  150 mg Oral Daily   latanoprost  1 drop Both Eyes QHS   midodrine  10 mg Oral TID WC   pantoprazole (PROTONIX) IV  40 mg Intravenous Q12H   Ensure Max Protein  11 oz Oral BID   vitamin B-12  500 mcg Oral Daily    Continuous Inpatient Infusions:    sodium chloride Stopped (08/05/21 1441)   albumin human 60 mL/hr at 08/07/21 0900   phenylephrine (NEO-SYNEPHRINE) Adult infusion Stopped (08/07/21 1207)    PRN Inpatient Medications:  acetaminophen, lidocaine HCl (PF), lip balm, polyethylene glycol  Review of Systems: Constitutional: Weight is stable.  Eyes: No changes in vision. ENT: No oral lesions, sore throat.  GI: see HPI.  Heme/Lymph: No easy bruising.  CV: No chest pain.  GU: No hematuria.  Integumentary: No rashes.  Neuro: No headaches.  Psych: No depression/anxiety.  Endocrine: No heat/cold intolerance.  Allergic/Immunologic: No urticaria.  Resp: No cough, SOB.  Musculoskeletal: No joint swelling.    Physical Examination: BP 102/74   Pulse 88   Temp 97.6 F (36.4 C) (Oral)   Resp (!) 23   Ht 5\' 2"  (1.575 m)   Wt 102 kg   SpO2 95%   BMI 41.13 kg/m  Gen: NAD, alert and oriented x 4 HEENT: PEERLA, EOMI, Neck: supple, no JVD or thyromegaly Chest: CTA bilaterally, no wheezes, crackles, or other adventitious  sounds CV: RRR, no m/g/c/r Abd: soft, NT, ND, +BS in all four quadrants; no HSM, guarding, ridigity, or rebound tenderness Ext: no edema, well perfused with 2+ pulses, Skin: no rash or lesions noted Lymph: no LAD  Data: Lab Results  Component Value Date   WBC 13.4 (H) 08/07/2021   HGB 9.4 (L) 08/07/2021   HCT 29.1 (L) 08/07/2021   MCV 86.6 08/07/2021   PLT 205 08/07/2021   Recent Labs  Lab 08/06/21 0510 08/06/21 2159 08/07/21 0552  HGB 10.3* 9.3* 9.4*   Lab Results  Component Value Date   NA 144 08/07/2021   K 3.4 (L) 08/07/2021   CL 104 08/07/2021   CO2 33 (H) 08/07/2021   BUN 51 (H) 08/07/2021   CREATININE 1.30 (H) 08/07/2021   Lab Results  Component Value Date   ALT 15 08/06/2021   AST 21 08/06/2021   ALKPHOS 40 08/06/2021   BILITOT 2.6 (H) 08/06/2021   Recent Labs  Lab 08/05/21 0444  INR 1.4*   Assessment/Plan:   GI bleeding - Presumed lower. Currently stable after transfusion, Eliquis held. No further rectal bleeding. No overt GIB.  Hypotensive shock - On pressors, improving, being weaned. Pericardial effusion s/p pericardiocentesis - Dr. Saunders Revel of Cardiology following patient.  Permanent atrial fibrillation - rate controlled, holding Eliquis  Recommendations:   - Continue to monitor H&H closely. Transfuse for Hgb <7.0.  - H&H stable with no further overt  gastrointestinal bleeding  - Appreciate Cardiology assistance in managing pericardial effusion - No indication for urgent luminal evaluation since no overt GIB and H&H stable - Continue optimization of hemodynamics - Consider luminal evaluation once cardiovascular status is optimized - Following   Please call with questions or concerns.    Octavia Bruckner, PA-C [facility] [phone] (Cell)

## 2021-08-07 NOTE — Progress Notes (Signed)
Patient under ICU service due to vasopressors and complexity in care. Will sign off from [facility] service. Spoke with Dr Mortimer Fries yesterday.

## 2021-08-07 NOTE — Progress Notes (Signed)
*  PRELIMINARY RESULTS* Echocardiogram 2D Echocardiogram has been performed.  Karen Farley Karen Farley 08/07/2021, 10:00 AM

## 2021-08-08 ENCOUNTER — Inpatient Hospital Stay (HOSPITAL_COMMUNITY)
Admit: 2021-08-08 | Discharge: 2021-08-08 | Disposition: A | Discharge status: Home / Self Care | Payer: Medicare PPO | Attending: Internal Medicine | Admitting: Internal Medicine

## 2021-08-08 ENCOUNTER — Inpatient Hospital Stay: Payer: Medicare PPO

## 2021-08-08 ENCOUNTER — Telehealth: Payer: Self-pay | Admitting: Family Medicine

## 2021-08-08 DIAGNOSIS — I509 Heart failure, unspecified: Secondary | ICD-10-CM | POA: Diagnosis not present

## 2021-08-08 DIAGNOSIS — I3139 Other pericardial effusion (noninflammatory): Secondary | ICD-10-CM | POA: Diagnosis not present

## 2021-08-08 DIAGNOSIS — I4821 Permanent atrial fibrillation: Secondary | ICD-10-CM | POA: Diagnosis not present

## 2021-08-08 DIAGNOSIS — I5033 Acute on chronic diastolic (congestive) heart failure: Secondary | ICD-10-CM | POA: Diagnosis not present

## 2021-08-08 LAB — BASIC METABOLIC PANEL
Anion gap: 9 (ref 5–15)
BUN: 37 mg/dL — ABNORMAL HIGH (ref 8–23)
CO2: 30 mmol/L (ref 22–32)
Calcium: 8.9 mg/dL (ref 8.9–10.3)
Chloride: 102 mmol/L (ref 98–111)
Creatinine, Ser: 1.33 mg/dL — ABNORMAL HIGH (ref 0.44–1.00)
GFR, Estimated: 42 mL/min — ABNORMAL LOW (ref >60)
Glucose, Bld: 158 mg/dL — ABNORMAL HIGH (ref 70–99)
Potassium: 3.6 mmol/L (ref 3.5–5.1)
Sodium: 141 mmol/L (ref 135–145)

## 2021-08-08 LAB — GLUCOSE, CAPILLARY
Glucose-Capillary: 185 mg/dL — ABNORMAL HIGH (ref 70–99)
Glucose-Capillary: 187 mg/dL — ABNORMAL HIGH (ref 70–99)
Glucose-Capillary: 161 mg/dL — ABNORMAL HIGH (ref 70–99)
Glucose-Capillary: 200 mg/dL — ABNORMAL HIGH (ref 70–99)

## 2021-08-08 LAB — CBC
HCT: 30 % — ABNORMAL LOW (ref 36.0–46.0)
Hemoglobin: 9.3 g/dL — ABNORMAL LOW (ref 12.0–15.0)
MCH: 27.2 pg (ref 26.0–34.0)
MCHC: 31 g/dL (ref 30.0–36.0)
MCV: 87.7 fL (ref 80.0–100.0)
Platelets: 192 10*3/uL (ref 150–400)
RBC: 3.42 MIL/uL — ABNORMAL LOW (ref 3.87–5.11)
RDW: 23.4 % — ABNORMAL HIGH (ref 11.5–15.5)
WBC: 10.5 10*3/uL (ref 4.0–10.5)
nRBC: 0.6 % — ABNORMAL HIGH (ref 0.0–0.2)

## 2021-08-08 LAB — ECHOCARDIOGRAM LIMITED
Height: 62 in
S' Lateral: 3 cm
Weight: 3597.91 oz

## 2021-08-08 MED ORDER — TORSEMIDE 20 MG PO TABS
20.0000 mg | ORAL_TABLET | Freq: Every day | ORAL | Status: DC
  Administered 2021-08-08 – 2021-08-09 (×2): 20 mg via ORAL
  Filled 2021-08-08 (×2): qty 1

## 2021-08-08 MED ORDER — TRAMADOL HCL 50 MG PO TABS
50.0000 mg | ORAL_TABLET | Freq: Four times a day (QID) | ORAL | Status: DC | PRN
  Administered 2021-08-08 – 2021-08-22 (×12): 50 mg via ORAL
  Filled 2021-08-08 (×14): qty 1

## 2021-08-08 NOTE — Progress Notes (Signed)
NAME:  Karen Farley, MRN:  [id number], DOB:  December 31, 1945, LOS: 69 ADMISSION DATE:  07/26/2021 CONSULTATION DATE:  08/04/2021 REFERRING MD:  Dr. Damita Dunnings, CHIEF COMPLAINT:  SOB & edema (on admit)   75 y/o female history HFpEF, Afib on Eliquis, PAD, DM, CKDIII, lymphedema, HTN admitted to hospital 10/6 with sob, orthopnea, weight gain with hypotension, large left pleural effusion, generalized anasarca on admission. S/p left thoracentesis 10/8 617ml removed and paracentesis 10/12 1.2L removed. Failed Lasix IV advanced to Lasix gtt; however, Lasix and Eliquis stopped 10/15 and patient transferred to ICU after had episode of GI bleed with drop in hgb, s/p 2U pRBC and vasopressor support initiated. Echo done 10/16 showed large pericardial effusion s/p pericardiocentesis 724mL fluid removed with drain placed with decreasing fluid output since placement. Repeat echo daily, followed by cardiology for updated plan of drain and diuresis.   History of Present Illness:  75 year old female presenting to [facility] ED from home on 07/26/21 with complaints of worsening dyspnea on exertion, orthopnea & 5 pound weight gain over the past 5-7 days.  ED course: Per ED documentation patient appeared clinically dehydrated with acute hyponatremia, hypokalemia & elevated BUN.  Chest x-ray showed persistent left-sided pleural effusion. Medications given: She received ceftriaxone azithromycin and gentle IV fluids.  Patient was admitted to hospitalist service. Initial Vitals: Afebrile at 98.2, RR 20, A. fib at 73, hypotensive 84/62 & SPO2 98% on room air Significant labs: (Labs/ Imaging personally reviewed) Chemistry: Na+:, 128 K+: 2.6, BUN/Cr.:  65/1.78, Serum CO2/ AG: 34/15 Hematology: WBC: 8.1, Hgb: 13.6,  Troponin: 17, BNP: 154.2, COVID-19 & Influenza A/B: Negative CXR 07/26/2021: Large left-sided pleural effusion and trace right pleural effusion. cardiomegaly with vascular congestion   Hospital course: Pulmonology consulted  for large left pleural effusion status post thoracentesis on 07/28/21 with removal of 600 mL.  Nephrology consulted to help manage AKI on CKD stage IIIb.  IV Lasix was attempted without sustained success, daily weights continued to increase and patient was transitioned to Lasix drip at 6 mg an hour with albumin twice daily & midodrine. On 08/04/21 patient was noted to have rectal bleeding, GI consulted and Eliquis was discontinued. Plan for colonoscopy/EGD after 48 hours. Patient had additional rectal bleeding episodes with correlating drop in Hgb 2 points, hypotension/tachycardia & lethargy with rapid transfer to ICU needing blood transfusion, STAT CT angiogram and vasopressor administration.   PCCM consulted for admission due to hemorrhagic shock & vasopressor administration.   CT angiogram 08/04/21 > In the rectum on the arterial phase images there is a focal area of contrast extravasation (best seen on image # 225 of series 5 & image # 135, 134 of series 10. Subsequent venous phase images show this to be more diffusely distributed within the rectum consistent with focal GI bleeding. No other area of contrast extravasation is seen. No other pooling of contrast is noted within the colon. Scattered diverticular change is seen without evidence of diverticulitis. Small bowel and stomach appear within normal limits.  Pertinent  Medical History  HFpEF Chronic atrial fibrillation on Eliquis PAD Type 2 diabetes mellitus CKD stage IIIb Lymphedema HTN HLD DDD  Significant Hospital Events: Including procedures, antibiotic start and stop dates in addition to other pertinent events   07/28/2021-pulmonology consulted & thoracentesis removed 600 mL 07/29/2021-nephrology following managing diuretics, AKI on CKD and electrolyte imbalances 08/01/2021-paracentesis done, 1.2L fluid removed 08/04/2021-patient had an episode of melena on day shift, GI was consulted & Eliquis discontinued. Overnight patient had  additional rectal  bleeding with correlating lethargy/ tachycardia/ hypotension. Patient was a rapid transfer to ICU: CTa abdomen/pelvis & blood. Patient remains hypotensive after IVF bolus while waiting for blood transfusion, PCCM consulted for vasopressor management. 08/05/2021-echocardiogram confirmed large pericardial effusion patient taken to Cath Lab for pericardiocentesis removing 700 mL of bloody drainage, returning to ICU with drain in place.  Patient remains on vasopressor support, "a little" rectal blood per nursing during the day, flexible sigmoidoscopy on hold due to pericardiocentesis.  Hemoglobin remained stable. 08/06/21- remains hypotensive on vasopressors, diuresis on hold, large volume output from pericardial drain cytology pending 08/07/21-remains on low-dose vasopressors, still with significant output from pericardial drain 08/08/21-repeat echo this am improved pericardial effusion, s/p pericardial drain removal per cardiology. Off pressors since yesterday. Vitals, labs stable.    Interim History / Subjective:  Sitting up in bed, in good spirits after having pericardial drain removed. Reports that back pain and leg pain from this am improved with Tramadol. Denies chest pain, soreness. Denies sob. Dry cough today somewhat improved compared to yesterday. Still with poor appetite; denies nausea or vomiting. Passing flatus but has not had BM.   Objective   Blood pressure 112/62, pulse 99, temperature 98 F (36.7 C), temperature source Axillary, resp. rate (!) 24, height 5\' 2"  (1.575 m), weight 102 kg, SpO2 92 %.     Intake/Output Summary (Last 24 hours) at 08/08/2021 0743 Last data filed at 08/08/2021 0600 Gross per 24 hour  Intake 302.22 ml  Output 1550 ml  Net -1247.78 ml    Filed Weights   08/04/21 2020 08/05/21 0413 08/06/21 0253  Weight: 101.7 kg 101.3 kg 102 kg      REVIEW OF SYSTEMS Review of Systems  Constitutional:  Positive for malaise/fatigue. Negative for  chills, fever and weight loss.  HENT: Negative.    Respiratory:  Positive for cough. Negative for hemoptysis, sputum production, shortness of breath and wheezing.        Occasional dry, nonproductive cough  Cardiovascular:  Negative for chest pain, palpitations, orthopnea and leg swelling.       Bilateral hand/wrist swelling worse today compared to yesterday  Gastrointestinal:  Negative for abdominal pain, blood in stool, constipation, diarrhea, melena, nausea and vomiting.  Genitourinary: Negative.   Musculoskeletal: Negative.   Skin: Negative.   Neurological:  Positive for weakness. Negative for dizziness, sensory change, loss of consciousness and headaches.  Psychiatric/Behavioral: Negative.     PHYSICAL EXAMINATION: General: Adult female, acutely ill, lying in bed, NAD  HEENT: MM pink/moist, anicteric, atraumatic, neck supple Neuro: A&O x 4, able to follow commands, PERRL +3, MAE-generalized weakness CV: s1s2 irregular, controlled atrial fibrillation on monitor, no r/m/g Pulm: Regular, non labored on 2 L nasal cannula, breath sounds clear-BUL & diminished-BLL GI: soft, rounded, non tender, bs x 4 GU: pure wick in place  with clear amber/yellow urine Skin: Scattered ecchymosis, BLE wrapped due to lymphedema and blisters(per patient) Extremities: warm/dry, pulses + 2 R/P, +3 edema noted BLE  Labs/imaging that I havepersonally reviewed  (right click and "Reselect all SmartList Selections" daily)  CXR 10/16>> Left pleural effusion enlarged as compared to prior chest imaging with left basilar atelectasis Echo 10/17>>LVEF 55%, pericardial effusion has decreased in size since yesterday but still appears large. The largest collections lie posterolateral to the left ventricle. No evidence of cardiac tamponade.  Echo 10/18>> predominantly posterior small-moderate pericardial effusion. There is no evidence of  cardiac tamponade. Large pleural effusion.  CXR 10/19>>Bilateral pleural effusions  left considerably greater than right.  ASSESSMENT AND PLAN  75 y/o female history HFpEF, Afib on Eliquis, PAD, DM, CKDIII, lymphedema, HTN admitted to hospital 10/6 with sob, orthopnea, weight gain with hypotension, large left pleural effusion, generalized anasarca on admission. S/p left thoracentesis 10/8 614ml removed and paracentesis 10/12 1.2L removed. Failed Lasix IV advanced to Lasix gtt; however, Lasix and Eliquis stopped 10/15 and patient transferred to ICU after had episode of GI bleed with drop in hgb, s/p 2U pRBC and vasopressor support initiated. Echo done 10/16 showed large pericardial effusion s/p pericardiocentesis 73mL fluid removed with drain placed with decreasing fluid output since placement. Repeat echo daily, followed by cardiology for updated plan of drain and diuresis.   Suspected lower Acute Gastrointestinal Bleed in the setting of Eliquis Circulatory shock secondary to acute blood loss PMHx: Atrial Fibrillation on Eliquis Last major episode of rectal bleeding 08/04/21 around 22:00. Hemoglobin has remained stable s/p 2 units PRBCs.  Flexible sigmoidoscopy deferred on 08/05/21 due to large pericardial effusion requiring pericardiocentesis. - Eliquis stopped after 9 am dose 10/15, remains on hold - Protonix twice daily - Maintain active type & screen, serial H&H - Monitor for s/s of bleeding - Daily CBC, PT/INR monitoring PRN - Transfuse for Hgb <7 - GI consulted, appreciate input. Diet per GI recs - Continue midodrine 10mg  TID and albumin IV - Maintain MAP > 65; now off pressors - Bowel care: Miralax prn; encouraged patient to use Miralax for BM ease. Patient hopeful will have BM once out of bed per PT eval today   Acute on Chronic HFpEF exacerbation Pericardial Effusion s/p pericardiocentesis and drain placement (08/05/21) Cardiogenic shock Anasarca PMHx: HFpEF ECHO 04/2021: LVEF 60 to 65%, , no pericardial effusion  CTa 08/04/21: Revealed large pericardial  effusion Echo 08/05/2021: LVEF 60 to 65%, IVC dilated, large pericardial effusion Echo 10/18>> predominantly posterior small-moderate pericardial effusion. There is no evidence of  cardiac tamponade. Large pleural effusion.  S/p urgent pericardiocentesis 10/16 removing 700 mL of bloody drainage with pericardial drain placed - Pericardial drain removed per cardiology 10/19 - Lasix drip on hold, consider restarting once patient has stabilized and is off vasopressor support - Cardiology consulted, appreciate input - BP support with midodrine as above - Supplemental oxygen as needed to maintain SPO2 > 90%   Left pleural effusion status post thoracentesis (07/28/21) CXR 08/05/21: Pericardial catheter in place, (read as) enlarging left-sided effusion  - Consider [place] chest with thoracentesis if indicated for large left pleural effusion. Patient resp status stable despite imaging findings. Continue to monitor closely.  - Supplemental oxygen PRN, to maintain SPO2 > 90% - Intermittent CXR & ABG PRN - Patient appears stable from a respiratory standpoint, consider additional intervention PRN   Acute Kidney Injury superimposed on CKD stage IIIb in the setting of acute on chronic HFpEF exacerbation & anasarca-resolved Hypokalemia Hyponatremia-resolved Baseline Cr: 1.66, Cr on admission: 1.78 Creatinine has returned to baseline -Continue fluid restriction  -Strict I's and O's -Lasix drip on hold, see above, continue albumin IV -Daily BMP, replace electrolytes as needed -Avoid nephrotoxic agents as able, ensure adequate renal perfusion -Nephrology following, appreciate input   Intake/Output Summary (Last 24 hours) at 08/08/2021 0743 Last data filed at 08/08/2021 0600 Gross per 24 hour  Intake 302.22 ml  Output 1550 ml  Net -1247.78 ml    Chronic Atrial Fibrillation-controlled CHA2DS2-VASc score: 6 Per patient report, she was changed to Eliquis from [place] in July 2022 -Outpatient metoprolol  discontinued in the setting of ongoing vasopressor requirement, restart as BP  allows -Eliquis remains on hold due to GI bleed; not on anticoagulation or SCDs at this time -Continuous cardiac monitoring -Cardiology following, appreciate input   Type 2 Diabetes Mellitus Steroid Induced Hyperglycemia- steroid discontinued 10/14 Hemoglobin A1C: 6.6 -Monitor CBG Q ACHS -SSI sensitive dosing -Target range while in ICU: 140-180 -Follow ICU hyper/hypoglycemia protocol -Diabetes coordinator consulted appreciate input   Iron and vitamin B12 deficiency -Per primary service: Continue Venofer followed by oral supplementation at discharge -Continue vitamin B12 supplementation daily   Best practice (right click and "Reselect all SmartList Selections" daily)  Diet: renal diet with fluid restriction Pain/Anxiety/Delirium protocol (if indicated): No VAP protocol (if indicated): Not indicated DVT prophylaxis: Contraindicated GI prophylaxis: PPI Central venous access:  N/A Arterial line:  N/A Foley:  N/A Mobility:  OOB as tol per PT/OT  Code Status:  FULL Disposition:ICU  Labs   CBC: Recent Labs  Lab 08/04/21 1233 08/04/21 1845 08/05/21 0444 08/05/21 1106 08/05/21 2308 08/06/21 0510 08/06/21 2159 08/07/21 0552 08/08/21 0445  WBC 10.9*  --  13.1*  --   --  14.6*  --  13.4* 10.5  NEUTROABS  --   --   --   --   --  10.8*  --   --   --   HGB 9.7*   < > 11.2*   < > 9.9* 10.3* 9.3* 9.4* 9.3*  HCT 30.0*   < > 32.8*   < > 29.4* 31.9* 29.5* 29.1* 30.0*  MCV 75.9*  --  80.2  --   --  86.2  --  86.6 87.7  PLT 288  --  244  --   --  227  --  205 192   < > = values in this interval not displayed.     Basic Metabolic Panel: Recent Labs  Lab 08/03/21 0454 08/04/21 0504 08/05/21 0444 08/06/21 0510 08/07/21 0552 08/08/21 0445  NA 135 136 140 143 144 141  K 4.1 3.8 4.0 3.4* 3.4* 3.6  CL 95* 93* 100 104 104 102  CO2 29 31 30 28  33* 30  GLUCOSE 280* 214* 244* 148* 147* 158*  BUN 60* 62*  87* 67* 51* 37*  CREATININE 1.75* 1.75* 1.55* 1.43* 1.30* 1.33*  CALCIUM 9.2 8.9 8.9 9.3 9.2 8.9  MG 2.7* 2.2 2.1 2.2 2.1  --   PHOS 3.3 2.7 3.7 3.5 3.1  --     GFR: Estimated Creatinine Clearance: 40.9 mL/min (A) (by C-G formula based on SCr of 1.33 mg/dL (H)). Recent Labs  Lab 08/05/21 0444 08/06/21 0510 08/07/21 0552 08/08/21 0445  WBC 13.1* 14.6* 13.4* 10.5     Liver Function Tests: Recent Labs  Lab 08/06/21 0510  AST 21  ALT 15  ALKPHOS 40  BILITOT 2.6*  PROT 5.3*  ALBUMIN 3.6    No results for input(s): LIPASE, AMYLASE in the last 168 hours. No results for input(s): AMMONIA in the last 168 hours.  ABG    Component Value Date/Time   HCO3 18.8 (L) 04/11/2021 0210   ACIDBASEDEF 8.7 (H) 04/11/2021 0210   O2SAT 16.3 04/11/2021 0210      Coagulation Profile: Recent Labs  Lab 08/05/21 0444  INR 1.4*     Cardiac Enzymes: No results for input(s): CKTOTAL, CKMB, CKMBINDEX, TROPONINI in the last 168 hours.  HbA1C: Hemoglobin A1C  Date/Time Value Ref Range Status  06/28/2016 12:00 AM 6.6  Final   HB A1C (BAYER DCA - WAIVED)  Date/Time Value Ref Range Status  07/13/2021 04:37 PM  6.9 (H) 4.8 - 5.6 % Final    Comment:             Prediabetes: 5.7 - 6.4          Diabetes: >6.4          Glycemic control for adults with diabetes: <7.0               **Please note reference interval change**   04/25/2021 11:34 AM 6.3 <7.0 % Final    Comment:                                          Diabetic Adult            <7.0                                       Healthy Adult        4.3 - 5.7                                                           (DCCT/NGSP) American Diabetes Association's Summary of Glycemic Recommendations for Adults with Diabetes: Hemoglobin A1c <7.0%. More stringent glycemic goals (A1c <6.0%) may further reduce complications at the cost of increased risk of hypoglycemia.    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   Surgical History:   Past Surgical History:  Procedure Laterality Date   CHOLECYSTECTOMY     DILATION AND CURETTAGE OF UTERUS     PERICARDIOCENTESIS N/A 08/05/2021   Procedure: PERICARDIOCENTESIS;  Surgeon: Isaias Cowman, MD;  Location: [facility];  Service: Cardiovascular;  Laterality: N/A;   TEAR DUCT PROBING WITH STRABISMUS REPAIR Right    TONSILLECTOMY       Social History:   reports that she has never smoked. She has never used smokeless tobacco. She reports that she does not currently use alcohol. She reports that she does not use drugs.   Family History:  Her family history includes Breast cancer in her maternal grandmother and mother; Cancer in her brother and brother; Heart disease in her brother and mother; Heart disease (age of onset: 80) in her father; Osteoporosis in her mother; Parkinson's disease in her brother.   Allergies No Known Allergies  Home Medications  Prior to Admission medications   Medication Sig Start Date End Date Taking? Authorizing Provider  ACCU-CHEK GUIDE test strip USE TO CHECK BLOOD SUGAR DAILY 12/18/20  Yes Brigit Doke, [name], DO  apixaban (ELIQUIS) 5 MG TABS tablet Take 1 tablet (5 mg total) by mouth 2 (two) times  daily. 04/18/21  Yes Fritzi Mandes, MD  latanoprost (XALATAN) 0.005 % ophthalmic solution Place 1 drop into both eyes at bedtime.   Yes [provider]  linagliptin (TRADJENTA) 5 MG TABS tablet Take 1 tablet (5 mg total) by mouth daily. 07/13/21  Yes Sarena Jezek, [name], DO  metoprolol succinate (TOPROL-XL) 50 MG 24 hr tablet TAKE 1 TABLET(50 MG) BY MOUTH DAILY WITH OR IMMEDIATELY FOLLOWING A MEAL 04/30/21  Yes Inez Rosato, [name], DO  Multiple Vitamin (MULTIVITAMIN WITH MINERALS) TABS tablet Take 1 tablet by mouth daily.   Yes [provider]  torsemide (DEMADEX) 20 MG tablet Take 20 mg by mouth daily.   Yes [provider]  Elastic Bandages & Supports ([place]) [place] by Does not apply route.    Dynasty Holquin, [name], DO  metolazone (ZAROXOLYN) 2.5 MG tablet Take 1 tablet (2.5 mg total) by mouth daily. 05/27/21 05/27/21  Nolberto Hanlon, MD  spironolactone (ALDACTONE) 25 MG tablet Take 1 tablet (25 mg total) by mouth daily. 05/28/21 05/27/21  Nolberto Hanlon, MD       DVT/GI PRX  assessed I Assessed the need for Labs I Assessed the need for Foley I Assessed the need for Central Venous Line Family Discussion when available I Assessed the need for Mobilization I made an Assessment of medications to be adjusted accordingly Safety Risk assessment completed  CASE DISCUSSED IN MULTIDISCIPLINARY ROUNDS WITH ICU TEAM     Critical Care Time devoted to patient care services described in this note is 50 minutes.   Critical care was necessary to treat /prevent imminent and life-threatening deterioration.    Corrin Parker, M.D.  [facility]  Medical Director [place] Director [facility] Cardio-Pulmonary Department

## 2021-08-08 NOTE — Progress Notes (Signed)
Karen Farley Inpatient Progress Note  Subjective: Patient seen for f/u GI bleed, Patient continues to be stable from a GI standpoint without any bleeding recurrence  Objective: Vital signs in last 24 hours: Temp:  [98 F (36.7 C)-98.9 F (37.2 C)] 98.5 F (36.9 C) (10/19 1600) Pulse Rate:  [79-108] 88 (10/19 1800) Resp:  [17-26] 17 (10/19 1800) BP: (98-149)/(56-121) 113/75 (10/19 1800) SpO2:  [88 %-98 %] 97 % (10/19 1800) Blood pressure 113/75, pulse 88, temperature 98.5 F (36.9 C), temperature source Oral, resp. rate 17, height 5\' 2"  (1.575 m), weight 102 kg, SpO2 97 %.    Intake/Output from previous day: 10/18 0701 - 10/19 0700 In: 302.2 [P.O.:265; I.V.:21.9; IV Piggyback:15.4] Out: 1550 [Urine:1300; Drains:250]  Intake/Output this shift: No intake/output data recorded.   Gen: NAD. Appears comfortable.  HEENT: [place]/AT. PERRLA. Normal external ear exam.  Chest: Rales bilaterally, no rhonchi  CV: Irreg, irreg. No gallops.  Abd: soft, nt, nd. BS+  Ext 3+ edema.   Neuro: Alert and oriented. Judgement appears normal. Nonfocal.   Lab Results: Results for orders placed or performed during the hospital encounter of 07/26/21 (from the past 24 hour(s))  Glucose, capillary     Status: Abnormal   Collection Time: 08/07/21  8:21 PM  Result Value Ref Range   Glucose-Capillary 164 (H) 70 - 99 mg/dL  CBC     Status: Abnormal   Collection Time: 08/08/21  4:45 AM  Result Value Ref Range   WBC 10.5 4.0 - 10.5 K/uL   RBC 3.42 (L) 3.87 - 5.11 MIL/uL   Hemoglobin 9.3 (L) 12.0 - 15.0 g/dL   HCT 30.0 (L) 36.0 - 46.0 %   MCV 87.7 80.0 - 100.0 fL   MCH 27.2 26.0 - 34.0 pg   MCHC 31.0 30.0 - 36.0 g/dL   RDW 23.4 (H) 11.5 - 15.5 %   Platelets 192 150 - 400 K/uL   nRBC 0.6 (H) 0.0 - 0.2 %  Basic metabolic panel     Status: Abnormal   Collection Time: 08/08/21  4:45 AM  Result Value Ref Range   Sodium 141 135 - 145 mmol/L   Potassium 3.6 3.5 - 5.1 mmol/L    Chloride 102 98 - 111 mmol/L   CO2 30 22 - 32 mmol/L   Glucose, Bld 158 (H) 70 - 99 mg/dL   BUN 37 (H) 8 - 23 mg/dL   Creatinine, Ser 1.33 (H) 0.44 - 1.00 mg/dL   Calcium 8.9 8.9 - 10.3 mg/dL   GFR, Estimated 42 (L) >60 mL/min   Anion gap 9 5 - 15  Glucose, capillary     Status: Abnormal   Collection Time: 08/08/21  7:31 AM  Result Value Ref Range   Glucose-Capillary 185 (H) 70 - 99 mg/dL  Glucose, capillary     Status: Abnormal   Collection Time: 08/08/21 11:36 AM  Result Value Ref Range   Glucose-Capillary 187 (H) 70 - 99 mg/dL  Glucose, capillary     Status: Abnormal   Collection Time: 08/08/21  2:59 PM  Result Value Ref Range   Glucose-Capillary 161 (H) 70 - 99 mg/dL     Recent Labs    08/06/21 0510 08/06/21 2159 08/07/21 0552 08/08/21 0445  WBC 14.6*  --  13.4* 10.5  HGB 10.3* 9.3* 9.4* 9.3*  HCT 31.9* 29.5* 29.1* 30.0*  PLT 227  --  205 192   BMET Recent Labs    08/06/21 0510 08/07/21 0552 08/08/21 0445  NA  143 144 141  K 3.4* 3.4* 3.6  CL 104 104 102  CO2 28 33* 30  GLUCOSE 148* 147* 158*  BUN 67* 51* 37*  CREATININE 1.43* 1.30* 1.33*  CALCIUM 9.3 9.2 8.9   LFT Recent Labs    08/06/21 0510  PROT 5.3*  ALBUMIN 3.6  AST 21  ALT 15  ALKPHOS 40  BILITOT 2.6*   PT/INR No results for input(s): LABPROT, INR in the last 72 hours. Hepatitis Panel No results for input(s): HEPBSAG, HCVAB, HEPAIGM, HEPBIGM in the last 72 hours. C-Diff No results for input(s): CDIFFTOX in the last 72 hours. No results for input(s): CDIFFPCR in the last 72 hours.   Studies/Results: DG Chest Port 1 View  Result Date: 08/08/2021 CLINICAL DATA:  Follow-up pleural effusion EXAM: PORTABLE CHEST 1 VIEW COMPARISON:  08/05/2021 FINDINGS: Cardiac shadow is enlarged but stable. Aortic calcifications are again seen. Left-sided pleural effusion is again identified and stable. Pericardial catheter is again identified and stable. Small right-sided pleural effusion is noted. Bony  structures appear within normal limits. IMPRESSION: Bilateral pleural effusions left considerably greater than right. The right-sided effusion is slightly larger than that seen on the prior study. Electronically Signed   By: Inez Catalina M.D.   On: 08/08/2021 03:40   ECHOCARDIOGRAM LIMITED  Result Date: 08/08/2021    ECHOCARDIOGRAM LIMITED REPORT   Patient Name:   Karen Farley Date of Exam: 08/08/2021 Medical Rec #:  [id number]          Height:       62.0 in Accession #:    [id number]         Weight:       224.9 lb Date of Birth:  07/22/1946           BSA:          2.009 m Patient Age:    75 years           BP:           101/66 mmHg Patient Gender: F                  HR:           85 bpm. Exam Location:  [facility] Procedure: Limited Echo, Limited Color Doppler and Cardiac Doppler Indications:     I31.3 Pericardial effusion  History:         Patient has prior history of Echocardiogram examinations, most                  recent 08/07/2021. HFpEF, CKD; Risk Factors:Hypertension,                  Diabetes and Dyslipidemia.  Sonographer:     Charmayne Sheer Referring Phys:  [phone] CHRISTOPHER END Diagnosing Phys: Kate Sable MD  Sonographer Comments: Suboptimal apical window and suboptimal subcostal window. Image acquisition challenging due to patient body habitus. IMPRESSIONS  1. Left ventricular ejection fraction, by estimation, is 60 to 65%. The left ventricle has normal function.  2. Right ventricular systolic function is normal. There is severely elevated pulmonary artery systolic pressure. The estimated right ventricular systolic pressure is 02.7 mmHg.  3. Large pleural effusion in the left lateral region.  4. The inferior vena cava is dilated in size with <50% respiratory variability, suggesting right atrial pressure of 15 mmHg. FINDINGS  Left Ventricle: Left ventricular ejection fraction, by estimation, is 60 to 65%. The left ventricle has normal function. Right Ventricle: No increase in  right ventricular wall  thickness. Right ventricular systolic function is normal. There is severely elevated pulmonary artery systolic pressure. The tricuspid regurgitant velocity is 3.52 m/s, and with an assumed right atrial pressure of 15 mmHg, the estimated right ventricular systolic pressure is 03.2 mmHg. Pericardium: Trivial pericardial effusion is present. Venous: The inferior vena cava is dilated in size with less than 50% respiratory variability, suggesting right atrial pressure of 15 mmHg. Additional Comments: There is a large pleural effusion in the left lateral region. LEFT VENTRICLE PLAX 2D LVIDd:         4.10 cm LVIDs:         3.00 cm LV PW:         1.20 cm LV IVS:        0.80 cm  LEFT ATRIUM         Index LA diam:    5.20 cm 2.59 cm/m  TRICUSPID VALVE TR Peak grad:   49.6 mmHg TR Vmax:        352.00 cm/s Kate Sable MD Electronically signed by Kate Sable MD Signature Date/Time: 08/08/2021/4:37:39 PM    Final    ECHOCARDIOGRAM LIMITED  Result Date: 08/07/2021    ECHOCARDIOGRAM LIMITED REPORT   Patient Name:   Karen Farley Date of Exam: 08/07/2021 Medical Rec #:  [id number]          Height:       62.0 in Accession #:    [id number]         Weight:       224.9 lb Date of Birth:  [birth year]           BSA:          2.009 m Patient Age:    48 years           BP:           93/69 mmHg Patient Gender: F                  HR:           92 bpm. Exam Location:  [facility] Procedure: Limited Echo, Limited Color Doppler and Cardiac Doppler Indications:     I31.3 Pericardial effusion  History:         Patient has prior history of Echocardiogram examinations, most                  recent 08/06/2021. HFpEF, CKD; Risk Factors:Diabetes,                  Hypertension and Dyslipidemia.  Sonographer:     Charmayne Sheer Referring Phys:  [phone] CHRISTOPHER END Diagnosing Phys: Nelva Bush MD  Sonographer Comments: Suboptimal apical window and suboptimal subcostal window. IMPRESSIONS  1. Left ventricular ejection fraction, by estimation,  is >55%. The left ventricle has normal function.  2. Right ventricular systolic function is normal. The right ventricular size is normal. There is moderately elevated pulmonary artery systolic pressure.  3. There is a predominantly posterior small-moderate pericardial effusion. Echogenic material in the pericardial space may reflect epicardial fat, thrombus, or other soft tissue. There is no evidence of cardiac tamponade. Large pleural effusion.  4. The inferior vena cava is normal in size with <50% respiratory variability, suggesting right atrial pressure of 8 mmHg. FINDINGS  Left Ventricle: Left ventricular ejection fraction, by estimation, is >55%. The left ventricle has normal function. Right Ventricle: The right ventricular size is normal. Right ventricular systolic function is normal. There is moderately  elevated pulmonary artery systolic pressure. The tricuspid regurgitant velocity is 3.54 m/s, and with an assumed right atrial pressure of 8 mmHg, the estimated right ventricular systolic pressure is 78.9 mmHg. Pericardium: There is a predominantly posterior small-moderate pericardial effusion. Echogenic material in the pericardial space may reflect epicardial fat, thrombus, or other soft tissue. There is no evidence of cardiac tamponade. Venous: The inferior vena cava is normal in size with less than 50% respiratory variability, suggesting right atrial pressure of 8 mmHg. Additional Comments: There is a large pleural effusion. LEFT VENTRICLE PLAX 2D LVIDd:         3.98 cm LVIDs:         3.13 cm LV PW:         0.75 cm LV IVS:        1.00 cm  LEFT ATRIUM         Index LA diam:    4.80 cm 2.39 cm/m  TRICUSPID VALVE TR Peak grad:   50.1 mmHg TR Vmax:        354.00 cm/s Nelva Bush MD Electronically signed by Nelva Bush MD Signature Date/Time: 08/07/2021/12:30:40 PM    Final (Updated)     Scheduled Inpatient Medications:    Chlorhexidine Gluconate Cloth  6 each Topical Daily   folic acid  1 mg Oral  Daily   insulin aspart  0-5 Units Subcutaneous QHS   insulin aspart  0-9 Units Subcutaneous TID WC   iron polysaccharides  150 mg Oral Daily   latanoprost  1 drop Both Eyes QHS   midodrine  10 mg Oral TID WC   pantoprazole (PROTONIX) IV  40 mg Intravenous Q12H   Ensure Max Protein  11 oz Oral BID   torsemide  20 mg Oral Daily   vitamin B-12  500 mcg Oral Daily    Continuous Inpatient Infusions:    sodium chloride Stopped (08/05/21 1441)   albumin human 60 mL/hr at 08/08/21 1800   phenylephrine (NEO-SYNEPHRINE) Adult infusion Stopped (08/07/21 1207)    PRN Inpatient Medications:  acetaminophen, lidocaine HCl (PF), lip balm, polyethylene glycol, traMADol  Assessment/Plan:   GI bleeding - Presumed lower. Currently stable after transfusion, Eliquis held. No further rectal bleeding. No overt GIB.  Hypotensive shock - On pressors, improving, being weaned. Pericardial effusion s/p pericardiocentesis - Dr. Garen Lah of Cardiology following patient.  Permanent atrial fibrillation - rate controlled, holding Eliquis  Recommendations:   - Continue to monitor H&H closely. Transfuse for Hgb <7.0.  - H&H stable with no further overt gastrointestinal bleeding  - Appreciate Cardiology assistance in managing pericardial effusion - No indication for urgent luminal evaluation since no overt GIB and H&H stable - Continue optimization of hemodynamics -May forego endoluminal evaluation for the near future given lack of bleeding recurrence and persistence of cardiopulmonary issues. Patient is in agreement.    -Please call us [facility])  back if any recurrent bleeding  Please call with questions or concerns.    Kipp Shank K. Alice Reichert, M.D. 08/08/2021, 7:11 PM

## 2021-08-08 NOTE — Progress Notes (Signed)
*  PRELIMINARY RESULTS* Echocardiogram 2D Echocardiogram has been performed.  Karen Farley 08/08/2021, 9:36 AM

## 2021-08-08 NOTE — Progress Notes (Signed)
[facility]  ROUNDING NOTE   Subjective:   Karen Farley was admitted to [facility] on 07/26/2021 for Morbid obesity ([place]) [E66.01] DOE (dyspnea on exertion) [R06.09] Recurrent left pleural effusion [J90] Hypotension [I95.9] Type 2 diabetes mellitus without complication, without long-term current use of insulin ([place]) [E11.9] Atrial fibrillation, unspecified type ([place]) [I48.91] Chronic congestive heart failure, unspecified heart failure type [facility]) [I50.9]  Patient was last seen by my partner, Dr. Candiss Norse, on 05/31/21. Where torsemide was restarted and to continue metolazone.   Patient admitted with shortness of breath and increasing peripheral edema. She was then started on IV furosemide. Left thoracentesis done on 07/28/21  62mL of pleural fluid.   Patient seen and evaluated at bedside Improved lower extremity edema, erythema and blistering Denies shortness of breath  Objective:  Vital signs in last 24 hours:  Temp:  [98 F (36.7 C)-98.6 F (37 C)] 98 F (36.7 C) (10/19 0400) Pulse Rate:  [88-111] 99 (10/19 0500) Resp:  [17-29] 24 (10/19 0500) BP: (89-116)/(27-92) 112/62 (10/19 0400) SpO2:  [90 %-100 %] 92 % (10/19 0500)  Weight change:  Filed Weights   08/04/21 2020 08/05/21 0413 08/06/21 0253  Weight: 101.7 kg 101.3 kg 102 kg    Intake/Output: I/O last 3 completed shifts: In: 742.5 [P.O.:385; I.V.:214.2; IV Piggyback:143.3] Out: 2450 [Urine:2150; Drains:300]   Intake/Output this shift:  Total I/O In: 120 [P.O.:120] Out: -   Physical Exam: General: NAD, resting in bed  Head: Normocephalic, atraumatic. Moist oral mucosal membranes  Eyes: Anicteric  Lungs:  Clear bilaterally, normal effort  Heart: Irregular rhythm  Abdomen:  Soft, nontender,   Extremities:  + BUE peripheral edema.  Neurologic: Nonfocal, moving all four extremities  Skin: Minimal blistering        Basic Metabolic Panel: Recent Labs  Lab 08/03/21 0454 08/04/21 0504  08/05/21 0444 08/06/21 0510 08/07/21 0552 08/08/21 0445  NA 135 136 140 143 144 141  K 4.1 3.8 4.0 3.4* 3.4* 3.6  CL 95* 93* 100 104 104 102  CO2 29 31 30 28  33* 30  GLUCOSE 280* 214* 244* 148* 147* 158*  BUN 60* 62* 87* 67* 51* 37*  CREATININE 1.75* 1.75* 1.55* 1.43* 1.30* 1.33*  CALCIUM 9.2 8.9 8.9 9.3 9.2 8.9  MG 2.7* 2.2 2.1 2.2 2.1  --   PHOS 3.3 2.7 3.7 3.5 3.1  --      Liver Function Tests: Recent Labs  Lab 08/06/21 0510  AST 21  ALT 15  ALKPHOS 40  BILITOT 2.6*  PROT 5.3*  ALBUMIN 3.6    No results for input(s): LIPASE, AMYLASE in the last 168 hours. No results for input(s): AMMONIA in the last 168 hours.  CBC: Recent Labs  Lab 08/04/21 1233 08/04/21 1845 08/05/21 0444 08/05/21 1106 08/05/21 2308 08/06/21 0510 08/06/21 2159 08/07/21 0552 08/08/21 0445  WBC 10.9*  --  13.1*  --   --  14.6*  --  13.4* 10.5  NEUTROABS  --   --   --   --   --  10.8*  --   --   --   HGB 9.7*   < > 11.2*   < > 9.9* 10.3* 9.3* 9.4* 9.3*  HCT 30.0*   < > 32.8*   < > 29.4* 31.9* 29.5* 29.1* 30.0*  MCV 75.9*  --  80.2  --   --  86.2  --  86.6 87.7  PLT 288  --  244  --   --  227  --  205 192   < > = values in this interval not displayed.     Cardiac Enzymes: No results for input(s): CKTOTAL, CKMB, CKMBINDEX, TROPONINI in the last 168 hours.  BNP: Invalid input(s): POCBNP  CBG: Recent Labs  Lab 08/07/21 1400 08/07/21 1738 08/07/21 2021 08/08/21 0731 08/08/21 1136  GLUCAP 184* 160* 164* 185* 187*     Microbiology: Results for orders placed or performed during the hospital encounter of 07/26/21  Resp Panel by RT-PCR (Flu A&B, Covid) Nasopharyngeal Swab     Status: None   Collection Time: 07/26/21  7:19 PM   Specimen: Nasopharyngeal Swab; Nasopharyngeal(NP) swabs in vial transport medium  Result Value Ref Range Status   SARS Coronavirus 2 by RT PCR NEGATIVE NEGATIVE Final    Comment: (NOTE) SARS-CoV-2 target nucleic acids are NOT DETECTED.  The SARS-CoV-2  RNA is generally detectable in upper respiratory specimens during the acute phase of infection. The lowest concentration of SARS-CoV-2 viral copies this assay can detect is 138 copies/mL. A negative result does not preclude SARS-Cov-2 infection and should not be used as the sole basis for treatment or other patient management decisions. A negative result may occur with  improper specimen collection/handling, submission of specimen other than nasopharyngeal swab, presence of viral mutation(s) within the areas targeted by this assay, and inadequate number of viral copies(<138 copies/mL). A negative result must be combined with clinical observations, patient history, and epidemiological information. The expected result is Negative.  Fact Sheet for Patients:  EntrepreneurPulse.com.au  Fact Sheet for Healthcare Providers:  IncredibleEmployment.be  This test is no t yet approved or cleared by the [place] FDA and  has been authorized for detection and/or diagnosis of SARS-CoV-2 by FDA under an Emergency Use Authorization (EUA). This EUA will remain  in effect (meaning this test can be used) for the duration of the COVID-19 declaration under Section 564(b)(1) of the Act, 21 U.S.C.section 360bbb-3(b)(1), unless the authorization is terminated  or revoked sooner.       Influenza A by PCR NEGATIVE NEGATIVE Final   Influenza B by PCR NEGATIVE NEGATIVE Final    Comment: (NOTE) The Xpert Xpress SARS-CoV-2/FLU/RSV plus assay is intended as an aid in the diagnosis of influenza from Nasopharyngeal swab specimens and should not be used as a sole basis for treatment. Nasal washings and aspirates are unacceptable for Xpert Xpress SARS-CoV-2/FLU/RSV testing.  Fact Sheet for Patients: EntrepreneurPulse.com.au  Fact Sheet for Healthcare Providers: IncredibleEmployment.be  This test is not yet approved or cleared by the  [place] FDA and has been authorized for detection and/or diagnosis of SARS-CoV-2 by FDA under an Emergency Use Authorization (EUA). This EUA will remain in effect (meaning this test can be used) for the duration of the COVID-19 declaration under Section 564(b)(1) of the Act, 21 U.S.C. section 360bbb-3(b)(1), unless the authorization is terminated or revoked.  Performed at [facility], [address] [place], [place] [address]   Body fluid culture w Gram Stain     Status: None   Collection Time: 07/28/21  1:53 PM   Specimen: PATH Cytology Pleural fluid  Result Value Ref Range Status   Specimen Description   Final    PLEURAL Performed at [facility], [address], [place], [place] [address]    Special Requests   Final    NONE Performed at [facility], [address] [place], [place] [address]    Gram Stain   Final    NO SQUAMOUS EPITHELIAL CELLS SEEN  FEW WBC SEEN NO ORGANISMS SEEN    Culture   Final    NO GROWTH 3 DAYS Performed at [facility], [place] [address], [place], [place] [address]    Report Status 08/01/2021 FINAL  Final  Body fluid culture w Gram Stain     Status: None   Collection Time: 08/01/21  3:36 PM   Specimen: PATH Cytology Peritoneal fluid  Result Value Ref Range Status   Specimen Description   Final    PERITONEAL Performed at [facility], [address], [place], [place] [address]    Special Requests   Final    NONE Performed at [facility], [address] [place], [place] [address]    Gram Stain   Final    RARE WBC PRESENT,BOTH PMN AND MONONUCLEAR NO ORGANISMS SEEN    Culture   Final    NO GROWTH 3 DAYS Performed at [facility], [place] [address], [place], [place] [address]    Report Status 08/05/2021 FINAL  Final  MRSA Next Gen by PCR, Nasal     Status: None   Collection Time: 08/04/21  8:56 PM   Specimen: Nasal Mucosa; Nasal Swab  Result Value Ref Range Status   MRSA  by PCR Next Gen NOT DETECTED NOT DETECTED Final    Comment: (NOTE) The GeneXpert MRSA Assay (FDA approved for NASAL specimens only), is one component of a comprehensive MRSA colonization surveillance program. It is not intended to diagnose MRSA infection nor to guide or monitor treatment for MRSA infections. Test performance is not FDA approved in patients less than 48 years old. Performed at [facility], [address] [place], [place] [address]   Body fluid culture w Gram Stain     Status: None (Preliminary result)   Collection Time: 08/05/21  5:50 PM   Specimen: Pericardial  Result Value Ref Range Status   Specimen Description   Final    PERICARDIAL Performed at [facility], [address], [place], [place] [address]    Special Requests   Final    NONE Performed at [facility], [address] [place], [place] [address]    Gram Stain   Final    FEW WBC PRESENT, PREDOMINANTLY MONONUCLEAR NO ORGANISMS SEEN    Culture   Final    NO GROWTH 3 DAYS Performed at [facility], [place] [address] [place],  [address]    Report Status PENDING  Incomplete    Coagulation Studies: No results for input(s): LABPROT, INR in the last 72 hours.   Urinalysis: No results for input(s): COLORURINE, LABSPEC, PHURINE, GLUCOSEU, HGBUR, BILIRUBINUR, KETONESUR, PROTEINUR, UROBILINOGEN, NITRITE, LEUKOCYTESUR in the last 72 hours.  Invalid input(s): APPERANCEUR    Imaging: DG Chest Port 1 View  Result Date: 08/08/2021 CLINICAL DATA:  Follow-up pleural effusion EXAM: PORTABLE CHEST 1 VIEW COMPARISON:  08/05/2021 FINDINGS: Cardiac shadow is enlarged but stable. Aortic calcifications are again seen. Left-sided pleural effusion is again identified and stable. Pericardial catheter is again identified and stable. Small right-sided pleural effusion is noted. Bony structures appear within normal limits. IMPRESSION: Bilateral pleural effusions left considerably  greater than right. The right-sided effusion is slightly larger than that seen on the prior study. Electronically Signed   By: Inez Catalina M.D.   On: 08/08/2021 03:40   ECHOCARDIOGRAM LIMITED  Result Date: 08/07/2021    ECHOCARDIOGRAM LIMITED REPORT   Patient Name:   Karen Farley Date of Exam:  08/07/2021 Medical Rec #:  [id number]          Height:       62.0 in Accession #:    [id number]         Weight:       224.9 lb Date of Birth:  10-12-1946           BSA:          2.009 m Patient Age:    26 years           BP:           93/69 mmHg Patient Gender: F                  HR:           92 bpm. Exam Location:  [facility] Procedure: Limited Echo, Limited Color Doppler and Cardiac Doppler Indications:     I31.3 Pericardial effusion  History:         Patient has prior history of Echocardiogram examinations, most                  recent 08/06/2021. HFpEF, CKD; Risk Factors:Diabetes,                  Hypertension and Dyslipidemia.  Sonographer:     Charmayne Sheer Referring Phys:  [phone] CHRISTOPHER END Diagnosing Phys: Nelva Bush MD  Sonographer Comments: Suboptimal apical window and suboptimal subcostal window. IMPRESSIONS  1. Left ventricular ejection fraction, by estimation, is >55%. The left ventricle has normal function.  2. Right ventricular systolic function is normal. The right ventricular size is normal. There is moderately elevated pulmonary artery systolic pressure.  3. There is a predominantly posterior small-moderate pericardial effusion. Echogenic material in the pericardial space may reflect epicardial fat, thrombus, or other soft tissue. There is no evidence of cardiac tamponade. Large pleural effusion.  4. The inferior vena cava is normal in size with <50% respiratory variability, suggesting right atrial pressure of 8 mmHg. FINDINGS  Left Ventricle: Left ventricular ejection fraction, by estimation, is >55%. The left ventricle has normal function. Right Ventricle: The right ventricular size is normal.  Right ventricular systolic function is normal. There is moderately elevated pulmonary artery systolic pressure. The tricuspid regurgitant velocity is 3.54 m/s, and with an assumed right atrial pressure of 8 mmHg, the estimated right ventricular systolic pressure is 41.6 mmHg. Pericardium: There is a predominantly posterior small-moderate pericardial effusion. Echogenic material in the pericardial space may reflect epicardial fat, thrombus, or other soft tissue. There is no evidence of cardiac tamponade. Venous: The inferior vena cava is normal in size with less than 50% respiratory variability, suggesting right atrial pressure of 8 mmHg. Additional Comments: There is a large pleural effusion. LEFT VENTRICLE PLAX 2D LVIDd:         3.98 cm LVIDs:         3.13 cm LV PW:         0.75 cm LV IVS:        1.00 cm  LEFT ATRIUM         Index LA diam:    4.80 cm 2.39 cm/m  TRICUSPID VALVE TR Peak grad:   50.1 mmHg TR Vmax:        354.00 cm/s Nelva Bush MD Electronically signed by Nelva Bush MD Signature Date/Time: 08/07/2021/12:30:40 PM    Final (Updated)      Medications:    sodium chloride Stopped (08/05/21 1441)   albumin human 12.5 g (  08/08/21 0914)   phenylephrine (NEO-SYNEPHRINE) Adult infusion Stopped (08/07/21 1207)    Chlorhexidine Gluconate Cloth  6 each Topical Daily   folic acid  1 mg Oral Daily   insulin aspart  0-5 Units Subcutaneous QHS   insulin aspart  0-9 Units Subcutaneous TID WC   iron polysaccharides  150 mg Oral Daily   latanoprost  1 drop Both Eyes QHS   midodrine  10 mg Oral TID WC   pantoprazole (PROTONIX) IV  40 mg Intravenous Q12H   Ensure Max Protein  11 oz Oral BID   vitamin B-12  500 mcg Oral Daily     Assessment/ Plan:  Karen Farley is a 76 y.o. white female with diastolic congestive heart failure, hypertension, hyperlipidemia, lymphedema, atrial fibrillation, peripheral vascular disease who is admitted to [facility] on 07/26/2021 for Morbid obesity ([place])  [E66.01] DOE (dyspnea on exertion) [R06.09] Recurrent left pleural effusion [J90] Hypotension [I95.9] Type 2 diabetes mellitus without complication, without long-term current use of insulin ([facility]) [E11.9] Atrial fibrillation, unspecified type ([place]) [I48.91] Chronic congestive heart failure, unspecified heart failure type ([place]) [I50.9]  Acute kidney injury on chronic kidney disease stage IIIB: with baseline creatinine of 1.66, GFR of 36 on 07/13/21. History of bland urine. 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 Hold benazepril and spironolactone.  Has received furosemide during this admission.  -Creatinine returned to baseline, continue to hold diuretics -Continue albumin twice daily  Hypotension: BP 112/62, phenylephrine discontinued yesterday  Acute exacerbation of diastolic congestive heart failure:  - Cardiology following, Echo on 08/07/21 shows EF 55% -Fluid restriction -Will defer diuretic restart to cardiology  Hyponatremia: with hypervolumia.   -Resolved with fluid restriction and previous diuresis  5. Left pleural effusion: Thoracentesis on 07/28/21 with 68ml removed. Paracentesis on 08/01/21 with 1.2L removed. Large pleural effusion seen on echo.  Pulmonology following and will consider intervention as needed, patient is stable and denies shortness of breath  6. Pericardial effusion- Pericardiocentesis performed on 08/05/21 with 1.1L removed.  Repeat echo on 08/07/21 shows a small to moderate pericardial effusion.  Recorded pericardial Hemovac output at 250 mL.  Cardiology considering removing drain and monitoring of reaccumulation of fluid.   LOS: [place] 10/19/202212:07 PM

## 2021-08-08 NOTE — Telephone Encounter (Signed)
Sarah calling,  from [place], in regards to a fax that was sent for pt in regards to statin medication. She states that the pt has diabetes and is not taking a statin. She states that their recommendation is to have the pt on any statin to help lower the risk for pt. Please advise.

## 2021-08-08 NOTE — Progress Notes (Signed)
75 y/o F w/ hx of CHF, a. fib, PAD, DM, CKDIII, lymphedema, HTN who been off pressors, not on oxygen, pericardial drain to be removed today by cardiology, she is alert and awake, NAD, can transfer back to [facility] service tomorrow AM, now SD status.   This is non-billable note. Hospitalist service will take over care tomorrow.

## 2021-08-08 NOTE — Evaluation (Signed)
Physical Therapy ReEvaluation Patient Details Name: Karen Farley MRN: [id number] DOB: Mar 14, 1946 Today's Date: 08/08/2021  History of Present Illness  75 y/o female history HFpEF, Afib on Eliquis, PAD, DM, CKDIII, lymphedema, HTN admitted to hospital 10/6 with sob, orthopnea, weight gain with hypotension, large left pleural effusion, generalized anasarca on admission. S/p left thoracentesis 10/8 667ml removed and paracentesis 10/12 1.2L removed. Failed Lasix IV advanced to Lasix gtt; however, Lasix and Eliquis stopped 10/15 and patient transferred to ICU after had episode of GI bleed with drop in hgb, s/p 2U pRBC and vasopressor support initiated. Echo done 10/16 showed large pericardial effusion s/p pericardiocentesis 763mL fluid removed with drain placed.  Clinical Impression  Pt with HR in the 110-120 range at rest, up to 130s with activity; O2 remained int he mid/low 90s t/o on room air. Pt endorses feeling much weaker than her baseline, but is eager to try and get up/do some mobility.  She did well with getting to EOB and rising to standing but did have very decreased tolerance with standing activity and though she was able to safely take a few steps from bed to recliner she did fatigue with the effort.  PT still recommending HHPT per functional improvement.  Pt not open to the idea of rehab, does have good family support at home.      Recommendations for follow up therapy are one component of a multi-disciplinary discharge planning process, led by the attending physician.  Recommendations may be updated based on patient status, additional functional criteria and insurance authorization.  Follow Up Recommendations Home health PT;Supervision for mobility/OOB (pt indicates that she does not think she will need HHPT by d/c)    Equipment Recommendations  Rolling walker with 5" wheels    Recommendations for Other Services       Precautions / Restrictions Precautions Precautions:  Fall Restrictions Weight Bearing Restrictions: No      Mobility  Bed Mobility Overal bed mobility: Modified Independent             General bed mobility comments: Pt with slow and labored effort but able to rise with bed rails and no direct assist    Transfers Overall transfer level: Needs assistance Equipment used: Rolling walker (2 wheeled) Transfers: Sit to/from Stand Sit to Stand: Min guard;Min assist         General transfer comment: very little cuing to keep weight shifting forward, cuing for set up  Ambulation/Gait Ambulation/Gait assistance: Min guard Gait Distance (Feet): 3 Feet Assistive device: Rolling walker (2 wheeled)       General Gait Details: Pt quick to fatigue and requests to abort ambulation attempt and just transfer over to the recliner, which she did slowly but successfully.  She showed good effort but did have fatigue - HR in the 110-130 range and O2 in low 90s  Stairs            Wheelchair Mobility    Modified Rankin (Stroke Patients Only)       Balance Overall balance assessment: Needs assistance   Sitting balance-Leahy Scale: Good     Standing balance support: During functional activity Standing balance-Leahy Scale: Fair Standing balance comment: reliant on walker and quick to fatigue                             Pertinent Vitals/Pain Pain Assessment: No/denies pain    Home Living Family/patient expects to be discharged to:: Private residence  Living Arrangements: Spouse/significant other Available Help at Discharge: Family;Available 24 hours/day Type of Home: House Home Access: Level entry     Home Layout: One level Home Equipment: Walker - 2 wheels;Cane - single point;Shower seat - built in      Prior Function Level of Independence: Independent with assistive device(s)   Gait / Transfers Assistance Needed: Pt reports that she is typically independent without AD for household mobility. Pt uses SPC/RW  for community distances (occassionally uses around the home when fatigued)           Hand Dominance        Extremity/Trunk Assessment   Upper Extremity Assessment Upper Extremity Assessment: Generalized weakness    Lower Extremity Assessment Lower Extremity Assessment: Generalized weakness       Communication   Communication: No difficulties  Cognition Arousal/Alertness: Awake/alert Behavior During Therapy: WFL for tasks assessed/performed Overall Cognitive Status: Within Functional Limits for tasks assessed                                        General Comments      Exercises     Assessment/Plan    PT Assessment Patient needs continued PT services  PT Problem List Decreased strength;Decreased mobility;Decreased safety awareness;Decreased balance;Decreased knowledge of use of DME;Cardiopulmonary status limiting activity;Decreased activity tolerance       PT Treatment Interventions DME instruction;Therapeutic exercise;Gait training;Balance training;Neuromuscular re-education;Stair training;Functional mobility training;Patient/family education;Therapeutic activities    PT Goals (Current goals can be found in the Care Plan section)  Acute Rehab PT Goals Patient Stated Goal: to regain independence PT Goal Formulation: With patient Time For Goal Achievement: 08/29/21 Potential to Achieve Goals: Good    Frequency Min 2X/week   Barriers to discharge        Co-evaluation               AM-PAC PT "6 Clicks" Mobility  Outcome Measure Help needed turning from your back to your side while in a flat bed without using bedrails?: A Little Help needed moving from lying on your back to sitting on the side of a flat bed without using bedrails?: A Little Help needed moving to and from a bed to a chair (including a wheelchair)?: A Little Help needed standing up from a chair using your arms (e.g., wheelchair or bedside chair)?: A Little Help needed  to walk in hospital room?: A Little Help needed climbing 3-5 steps with a railing? : A Lot 6 Click Score: 17    End of Session Equipment Utilized During Treatment: Gait belt Activity Tolerance: Patient limited by fatigue Patient left: in chair;with call bell/phone within reach Nurse Communication: Mobility status (needs new PureWick) PT Visit Diagnosis: Muscle weakness (generalized) (M62.81);Difficulty in walking, not elsewhere classified (R26.2);Unsteadiness on feet (R26.81)    Time: 6578-4696 PT Time Calculation (min) (ACUTE ONLY): 28 min   Charges:   PT Evaluation $PT Re-evaluation: 1 Re-eval PT Treatments $Therapeutic Activity: 8-22 mins        Kreg Shropshire, DPT 08/08/2021, 4:16 PM

## 2021-08-08 NOTE — Progress Notes (Addendum)
Progress Note  Patient Name: Karen Farley Date of Encounter: 08/08/2021  [facility] Cardiologist: Kate Sable, MD   Subjective   Feels okay, wants to move out of bed to chair.  Still with swelling in the legs.  Minimal drainage from pericardial drain.  Inpatient Medications    Scheduled Meds:  Chlorhexidine Gluconate Cloth  6 each Topical Daily   folic acid  1 mg Oral Daily   insulin aspart  0-5 Units Subcutaneous QHS   insulin aspart  0-9 Units Subcutaneous TID WC   iron polysaccharides  150 mg Oral Daily   latanoprost  1 drop Both Eyes QHS   midodrine  10 mg Oral TID WC   pantoprazole (PROTONIX) IV  40 mg Intravenous Q12H   Ensure Max Protein  11 oz Oral BID   vitamin B-12  500 mcg Oral Daily   Continuous Infusions:  sodium chloride Stopped (08/05/21 1441)   albumin human Stopped (08/08/21 1005)   phenylephrine (NEO-SYNEPHRINE) Adult infusion Stopped (08/07/21 1207)   PRN Meds: acetaminophen, lidocaine HCl (PF), lip balm, polyethylene glycol, traMADol   Vital Signs    Vitals:   08/08/21 1200 08/08/21 1300 08/08/21 1400 08/08/21 1500  BP:  112/64 100/65 129/85  Pulse:  (!) 105 (!) 105 (!) 108  Resp:  19 18 (!) 23  Temp: 98.7 F (37.1 C)     TempSrc: Oral     SpO2:  93% (!) 88% (!) 89%  Weight:      Height:        Intake/Output Summary (Last 24 hours) at 08/08/2021 1631 Last data filed at 08/08/2021 1500 Gross per 24 hour  Intake 721.51 ml  Output 1000 ml  Net -278.49 ml   Last 3 Weights 08/06/2021 08/05/2021 08/04/2021  Weight (lbs) 224 lb 13.9 oz 223 lb 5.2 oz 224 lb 3.3 oz  Weight (kg) 102 kg 101.3 kg 101.7 kg      Telemetry    Atrial fibrillation heart rate 95-1 04- Personally Reviewed  ECG     - Personally Reviewed  Physical Exam   GEN: Soft-spoken, Neck: No JVD Cardiac: Irregular irregular Respiratory: Rhonchorous breath sounds at bases GI: Soft, nontender, non-distended  MS: 2+ edema Neuro:  Nonfocal  Psych:  Normal affect   Labs    High Sensitivity Troponin:   Recent Labs  Lab 07/26/21 1708  TROPONINIHS 17     Chemistry Recent Labs  Lab 08/05/21 0444 08/06/21 0510 08/07/21 0552 08/08/21 0445  NA 140 143 144 141  K 4.0 3.4* 3.4* 3.6  CL 100 104 104 102  CO2 30 28 33* 30  GLUCOSE 244* 148* 147* 158*  BUN 87* 67* 51* 37*  CREATININE 1.55* 1.43* 1.30* 1.33*  CALCIUM 8.9 9.3 9.2 8.9  MG 2.1 2.2 2.1  --   PROT  --  5.3*  --   --   ALBUMIN  --  3.6  --   --   AST  --  21  --   --   ALT  --  15  --   --   ALKPHOS  --  40  --   --   BILITOT  --  2.6*  --   --   GFRNONAA 35* 38* 43* 42*  ANIONGAP 10 11 7 9     Lipids No results for input(s): CHOL, TRIG, HDL, LABVLDL, LDLCALC, CHOLHDL in the last 168 hours.  Hematology Recent Labs  Lab 08/06/21 0510 08/06/21 2159 08/07/21 3790 08/08/21 0445  WBC 14.6*  --  13.4* 10.5  RBC 3.70*  --  3.36* 3.42*  HGB 10.3* 9.3* 9.4* 9.3*  HCT 31.9* 29.5* 29.1* 30.0*  MCV 86.2  --  86.6 87.7  MCH 27.8  --  28.0 27.2  MCHC 32.3  --  32.3 31.0  RDW 19.8*  --  21.2* 23.4*  PLT 227  --  205 192   Thyroid No results for input(s): TSH, FREET4 in the last 168 hours.  BNP Recent Labs  Lab 08/05/21 0444  BNP 184.5*    DDimer No results for input(s): DDIMER in the last 168 hours.   Radiology    DG Chest Port 1 View  Result Date: 08/08/2021 CLINICAL DATA:  Follow-up pleural effusion EXAM: PORTABLE CHEST 1 VIEW COMPARISON:  08/05/2021 FINDINGS: Cardiac shadow is enlarged but stable. Aortic calcifications are again seen. Left-sided pleural effusion is again identified and stable. Pericardial catheter is again identified and stable. Small right-sided pleural effusion is noted. Bony structures appear within normal limits. IMPRESSION: Bilateral pleural effusions left considerably greater than right. The right-sided effusion is slightly larger than that seen on the prior study. Electronically Signed   By: Inez Catalina M.D.   On: 08/08/2021 03:40    ECHOCARDIOGRAM LIMITED  Result Date: 08/07/2021    ECHOCARDIOGRAM LIMITED REPORT   Patient Name:   Karen Farley Date of Exam: 08/07/2021 Medical Rec #:  [id number]          Height:       62.0 in Accession #:    [id number]         Weight:       224.9 lb Date of Birth:  02-Sep-1946           BSA:          2.009 m Patient Age:    75 years           BP:           93/69 mmHg Patient Gender: F                  HR:           92 bpm. Exam Location:  [facility] Procedure: Limited Echo, Limited Color Doppler and Cardiac Doppler Indications:     I31.3 Pericardial effusion  History:         Patient has prior history of Echocardiogram examinations, most                  recent 08/06/2021. HFpEF, CKD; Risk Factors:Diabetes,                  Hypertension and Dyslipidemia.  Sonographer:     Charmayne Sheer Referring Phys:  [phone] CHRISTOPHER END Diagnosing Phys: Nelva Bush MD  Sonographer Comments: Suboptimal apical window and suboptimal subcostal window. IMPRESSIONS  1. Left ventricular ejection fraction, by estimation, is >55%. The left ventricle has normal function.  2. Right ventricular systolic function is normal. The right ventricular size is normal. There is moderately elevated pulmonary artery systolic pressure.  3. There is a predominantly posterior small-moderate pericardial effusion. Echogenic material in the pericardial space may reflect epicardial fat, thrombus, or other soft tissue. There is no evidence of cardiac tamponade. Large pleural effusion.  4. The inferior vena cava is normal in size with <50% respiratory variability, suggesting right atrial pressure of 8 mmHg. FINDINGS  Left Ventricle: Left ventricular ejection fraction, by estimation, is >55%. The left ventricle has normal function. Right Ventricle:  The right ventricular size is normal. Right ventricular systolic function is normal. There is moderately elevated pulmonary artery systolic pressure. The tricuspid regurgitant velocity is 3.54 m/s, and with an  assumed right atrial pressure of 8 mmHg, the estimated right ventricular systolic pressure is 86.5 mmHg. Pericardium: There is a predominantly posterior small-moderate pericardial effusion. Echogenic material in the pericardial space may reflect epicardial fat, thrombus, or other soft tissue. There is no evidence of cardiac tamponade. Venous: The inferior vena cava is normal in size with less than 50% respiratory variability, suggesting right atrial pressure of 8 mmHg. Additional Comments: There is a large pleural effusion. LEFT VENTRICLE PLAX 2D LVIDd:         3.98 cm LVIDs:         3.13 cm LV PW:         0.75 cm LV IVS:        1.00 cm  LEFT ATRIUM         Index LA diam:    4.80 cm 2.39 cm/m  TRICUSPID VALVE TR Peak grad:   50.1 mmHg TR Vmax:        354.00 cm/s Nelva Bush MD Electronically signed by Nelva Bush MD Signature Date/Time: 08/07/2021/12:30:40 PM    Final (Updated)     Cardiac Studies   Limited echo 08/08/2021 1. Left ventricular ejection fraction, by estimation, is 60 to 65%. The  left ventricle has normal function.   2. Right ventricular systolic function is normal. There is severely  elevated pulmonary artery systolic pressure. The estimated right  ventricular systolic pressure is 78.4 mmHg.   3. Large pleural effusion in the left lateral region.   4. The inferior vena cava is dilated in size with <50% respiratory  variability, suggesting right atrial pressure of 15 mmHg.   Patient Profile     75 y.o. female with history of HFpEF, hypertension, CKD 3, permanent atrial fibrillation presenting with hypotension, GI bleed and large pericardial effusion s/p pericardial drainage placement.  Assessment & Plan    Pericardial effusion -S/p pericardiocentesis and drain placement with hemorrhagic effusion -Minimal drainage over the past 24 hours. -Pericardial drainage tube removed today. -Plan to repeat echo in 48 hours (Friday) to evaluate re-accumulation -If pericardial fluid  reaccumulate, consider CT surgery consult for pericardial window. -Continue to hold Eliquis  2.  HFpEF, volume overload -Off pressors, still on midodrine. -Blood pressures appear more stable. -Start PTA torsemide 20 mg daily. -Monitor blood pressures closely.  Titrate midodrine off as BP permits.  3.  Permanent A. Fib -Rates relatively controlled off beta-blocker -Holding Eliquis due to GI bleed, hemorrhagic pericardial effusion  Total encounter time 35 minutes  Greater than 50% was spent in counseling and coordination of care with the patient     Signed, Kate Sable, MD  08/08/2021, 4:31 PM

## 2021-08-09 DIAGNOSIS — D62 Acute posthemorrhagic anemia: Secondary | ICD-10-CM | POA: Diagnosis not present

## 2021-08-09 DIAGNOSIS — I5033 Acute on chronic diastolic (congestive) heart failure: Secondary | ICD-10-CM | POA: Diagnosis not present

## 2021-08-09 DIAGNOSIS — J9601 Acute respiratory failure with hypoxia: Secondary | ICD-10-CM | POA: Diagnosis not present

## 2021-08-09 DIAGNOSIS — I4821 Permanent atrial fibrillation: Secondary | ICD-10-CM | POA: Diagnosis not present

## 2021-08-09 LAB — BASIC METABOLIC PANEL
Anion gap: 6 (ref 5–15)
BUN: 35 mg/dL — ABNORMAL HIGH (ref 8–23)
CO2: 34 mmol/L — ABNORMAL HIGH (ref 22–32)
Calcium: 8.9 mg/dL (ref 8.9–10.3)
Chloride: 101 mmol/L (ref 98–111)
Creatinine, Ser: 1.39 mg/dL — ABNORMAL HIGH (ref 0.44–1.00)
GFR, Estimated: 40 mL/min — ABNORMAL LOW (ref >60)
Glucose, Bld: 146 mg/dL — ABNORMAL HIGH (ref 70–99)
Potassium: 3.3 mmol/L — ABNORMAL LOW (ref 3.5–5.1)
Sodium: 141 mmol/L (ref 135–145)

## 2021-08-09 LAB — MAGNESIUM: Magnesium: 2 mg/dL (ref 1.7–2.4)

## 2021-08-09 LAB — CBC WITH DIFFERENTIAL/PLATELET
Abs Immature Granulocytes: 0.13 10*3/uL — ABNORMAL HIGH (ref 0.00–0.07)
Basophils Absolute: 0 10*3/uL (ref 0.0–0.1)
Basophils Relative: 0 %
Eosinophils Absolute: 0.4 10*3/uL (ref 0.0–0.5)
Eosinophils Relative: 4 %
HCT: 30.9 % — ABNORMAL LOW (ref 36.0–46.0)
Hemoglobin: 9.7 g/dL — ABNORMAL LOW (ref 12.0–15.0)
Immature Granulocytes: 2 %
Lymphocytes Relative: 10 %
Lymphs Abs: 0.8 10*3/uL (ref 0.7–4.0)
MCH: 27.7 pg (ref 26.0–34.0)
MCHC: 31.4 g/dL (ref 30.0–36.0)
MCV: 88.3 fL (ref 80.0–100.0)
Monocytes Absolute: 0.8 10*3/uL (ref 0.1–1.0)
Monocytes Relative: 10 %
Neutro Abs: 6.2 10*3/uL (ref 1.7–7.7)
Neutrophils Relative %: 74 %
Platelets: 186 10*3/uL (ref 150–400)
RBC: 3.5 MIL/uL — ABNORMAL LOW (ref 3.87–5.11)
RDW: 24.9 % — ABNORMAL HIGH (ref 11.5–15.5)
Smear Review: NORMAL
WBC: 8.2 10*3/uL (ref 4.0–10.5)
nRBC: 0.2 % (ref 0.0–0.2)

## 2021-08-09 LAB — BODY FLUID CULTURE W GRAM STAIN: Culture: NO GROWTH

## 2021-08-09 LAB — GLUCOSE, CAPILLARY
Glucose-Capillary: 130 mg/dL — ABNORMAL HIGH (ref 70–99)
Glucose-Capillary: 177 mg/dL — ABNORMAL HIGH (ref 70–99)
Glucose-Capillary: 189 mg/dL — ABNORMAL HIGH (ref 70–99)
Glucose-Capillary: 180 mg/dL — ABNORMAL HIGH (ref 70–99)

## 2021-08-09 LAB — PHOSPHORUS: Phosphorus: 2.3 mg/dL — ABNORMAL LOW (ref 2.5–4.6)

## 2021-08-09 MED ORDER — TORSEMIDE 20 MG PO TABS: 20.0000 mg | ORAL_TABLET | Freq: Two times a day (BID) | ORAL | Status: DC

## 2021-08-09 MED ORDER — POTASSIUM CHLORIDE CRYS ER 20 MEQ PO TBCR
40.0000 meq | EXTENDED_RELEASE_TABLET | Freq: Two times a day (BID) | ORAL | Status: AC
  Administered 2021-08-09 (×2): 40 meq via ORAL
  Filled 2021-08-09 (×2): qty 2

## 2021-08-09 MED ORDER — TORSEMIDE 20 MG PO TABS
20.0000 mg | ORAL_TABLET | Freq: Two times a day (BID) | ORAL | Status: DC
  Administered 2021-08-09 – 2021-08-10 (×2): 20 mg via ORAL
  Filled 2021-08-09 (×2): qty 1

## 2021-08-09 NOTE — Plan of Care (Signed)
  Problem: Education: Goal: Knowledge of General Education information will improve Description: Including pain rating scale, medication(s)/side effects and non-pharmacologic comfort measures 08/09/2021 1801 by Janit Bern, RN Outcome: Progressing 08/09/2021 1619 by Janit Bern, RN Outcome: Progressing   Problem: Health Behavior/Discharge Planning: Goal: Ability to manage health-related needs will improve 08/09/2021 1801 by Janit Bern, RN Outcome: Progressing 08/09/2021 1619 by Janit Bern, RN Outcome: Progressing   Problem: Clinical Measurements: Goal: Ability to maintain clinical measurements within normal limits will improve 08/09/2021 1801 by Janit Bern, RN Outcome: Progressing 08/09/2021 1619 by Janit Bern, RN Outcome: Progressing Goal: Will remain free from infection 08/09/2021 1801 by Janit Bern, RN Outcome: Progressing 08/09/2021 1619 by Janit Bern, RN Outcome: Progressing Goal: Diagnostic test results will improve 08/09/2021 1801 by Janit Bern, RN Outcome: Progressing 08/09/2021 1619 by Janit Bern, RN Outcome: Progressing Goal: Respiratory complications will improve 08/09/2021 1801 by Janit Bern, RN Outcome: Progressing 08/09/2021 1619 by Janit Bern, RN Outcome: Progressing Goal: Cardiovascular complication will be avoided 08/09/2021 1801 by Janit Bern, RN Outcome: Progressing 08/09/2021 1619 by Janit Bern, RN Outcome: Progressing   Problem: Activity: Goal: Risk for activity intolerance will decrease 08/09/2021 1801 by Janit Bern, RN Outcome: Progressing 08/09/2021 1619 by Janit Bern, RN Outcome: Progressing   Problem: Nutrition: Goal: Adequate nutrition will be maintained 08/09/2021 1801 by Janit Bern, RN Outcome: Progressing 08/09/2021 1619 by Janit Bern, RN Outcome: Progressing   Problem: Coping: Goal: Level of anxiety will  decrease 08/09/2021 1801 by Janit Bern, RN Outcome: Progressing 08/09/2021 1619 by Janit Bern, RN Outcome: Progressing   Problem: Elimination: Goal: Will not experience complications related to bowel motility 08/09/2021 1801 by Janit Bern, RN Outcome: Progressing 08/09/2021 1619 by Janit Bern, RN Outcome: Progressing Goal: Will not experience complications related to urinary retention 08/09/2021 1801 by Janit Bern, RN Outcome: Progressing 08/09/2021 1619 by Janit Bern, RN Outcome: Progressing   Problem: Pain Managment: Goal: General experience of comfort will improve 08/09/2021 1801 by Janit Bern, RN Outcome: Progressing 08/09/2021 1619 by Janit Bern, RN Outcome: Progressing   Problem: Safety: Goal: Ability to remain free from injury will improve 08/09/2021 1801 by Janit Bern, RN Outcome: Progressing 08/09/2021 1619 by Janit Bern, RN Outcome: Progressing   Problem: Education: Goal: Ability to demonstrate management of disease process will improve 08/09/2021 1801 by Janit Bern, RN Outcome: Progressing 08/09/2021 1619 by Janit Bern, RN Outcome: Progressing Goal: Ability to verbalize understanding of medication therapies will improve 08/09/2021 1801 by Janit Bern, RN Outcome: Progressing 08/09/2021 1619 by Janit Bern, RN Outcome: Progressing Goal: Individualized Educational Video(s) 08/09/2021 1801 by Janit Bern, RN Outcome: Progressing 08/09/2021 1619 by Janit Bern, RN Outcome: Progressing   Problem: Skin Integrity: Goal: Risk for impaired skin integrity will decrease 08/09/2021 1801 by Janit Bern, RN Outcome: Progressing 08/09/2021 1619 by Janit Bern, RN Outcome: Progressing   Problem: Activity: Goal: Capacity to carry out activities will improve 08/09/2021 1801 by Janit Bern, RN Outcome: Progressing 08/09/2021 1619 by Janit Bern, RN Outcome: Progressing   Problem: Cardiac: Goal: Ability to achieve and maintain adequate cardiopulmonary perfusion will improve 08/09/2021 1801 by Janit Bern, RN Outcome: Progressing 08/09/2021 1619 by Janit Bern, RN Outcome: Progressing

## 2021-08-09 NOTE — Progress Notes (Signed)
Progress Note  Patient Name: Karen Farley Date of Encounter: 08/09/2021  [facility] Cardiologist: Kate Sable, MD   Subjective   Feeling well, still feels very edematous, arms, legs Feels that she still has lots of fluid on board, mild shortness of breath Does not feel hungry, abdomen mild discomfort 1 L negative, 6.5 negative total  Inpatient Medications    Scheduled Meds:  Chlorhexidine Gluconate Cloth  6 each Topical Daily   folic acid  1 mg Oral Daily   insulin aspart  0-5 Units Subcutaneous QHS   insulin aspart  0-9 Units Subcutaneous TID WC   iron polysaccharides  150 mg Oral Daily   latanoprost  1 drop Both Eyes QHS   midodrine  10 mg Oral TID WC   pantoprazole (PROTONIX) IV  40 mg Intravenous Q12H   potassium chloride  40 mEq Oral BID   Ensure Max Protein  11 oz Oral BID   torsemide  20 mg Oral BID   vitamin B-12  500 mcg Oral Daily   Continuous Infusions:  sodium chloride Stopped (08/05/21 1441)   albumin human 12.5 g (08/09/21 0850)   PRN Meds: acetaminophen, lidocaine HCl (PF), lip balm, polyethylene glycol, traMADol   Vital Signs    Vitals:   08/08/21 2000 08/08/21 2300 08/09/21 0000 08/09/21 0400  BP: 107/72  106/75 111/73  Pulse: (!) 101 83 88 89  Resp: 17 15 15  (!) 22  Temp: 98.4 F (36.9 C)  98.2 F (36.8 C) 98.6 F (37 C)  TempSrc: Oral  Oral Oral  SpO2: 97% 99% 99% 99%  Weight:      Height:        Intake/Output Summary (Last 24 hours) at 08/09/2021 1203 Last data filed at 08/08/2021 2300 Gross per 24 hour  Intake 860.79 ml  Output 1600 ml  Net -739.21 ml   Last 3 Weights 08/06/2021 08/05/2021 08/04/2021  Weight (lbs) 224 lb 13.9 oz 223 lb 5.2 oz 224 lb 3.3 oz  Weight (kg) 102 kg 101.3 kg 101.7 kg      Telemetry    NSR- Personally Reviewed  ECG    - Personally Reviewed  Physical Exam   GEN: No acute distress.   Neck: No JVD Cardiac: Irregularly irregular no murmurs, rubs, or gallops.  Respiratory:  Clear to auscultation bilaterally. GI: Soft, nontender, non-distended  MS: No edema; No deformity. Neuro:  Nonfocal  Psych: Normal affect   Labs    High Sensitivity Troponin:   Recent Labs  Lab 07/26/21 1708  TROPONINIHS 17     Chemistry Recent Labs  Lab 08/06/21 0510 08/07/21 0552 08/08/21 0445 08/09/21 0532  NA 143 144 141 141  K 3.4* 3.4* 3.6 3.3*  CL 104 104 102 101  CO2 28 33* 30 34*  GLUCOSE 148* 147* 158* 146*  BUN 67* 51* 37* 35*  CREATININE 1.43* 1.30* 1.33* 1.39*  CALCIUM 9.3 9.2 8.9 8.9  MG 2.2 2.1  --  2.0  PROT 5.3*  --   --   --   ALBUMIN 3.6  --   --   --   AST 21  --   --   --   ALT 15  --   --   --   ALKPHOS 40  --   --   --   BILITOT 2.6*  --   --   --   GFRNONAA 38* 43* 42* 40*  ANIONGAP 11 7 9 6     Lipids No results  for input(s): CHOL, TRIG, HDL, LABVLDL, LDLCALC, CHOLHDL in the last 168 hours.  Hematology Recent Labs  Lab 08/07/21 0552 08/08/21 0445 08/09/21 0532  WBC 13.4* 10.5 8.2  RBC 3.36* 3.42* 3.50*  HGB 9.4* 9.3* 9.7*  HCT 29.1* 30.0* 30.9*  MCV 86.6 87.7 88.3  MCH 28.0 27.2 27.7  MCHC 32.3 31.0 31.4  RDW 21.2* 23.4* 24.9*  PLT 205 192 186   Thyroid No results for input(s): TSH, FREET4 in the last 168 hours.  BNP Recent Labs  Lab 08/05/21 0444  BNP 184.5*    DDimer No results for input(s): DDIMER in the last 168 hours.   Radiology    DG Chest Port 1 View  Result Date: 08/08/2021 CLINICAL DATA:  Follow-up pleural effusion EXAM: PORTABLE CHEST 1 VIEW COMPARISON:  08/05/2021 FINDINGS: Cardiac shadow is enlarged but stable. Aortic calcifications are again seen. Left-sided pleural effusion is again identified and stable. Pericardial catheter is again identified and stable. Small right-sided pleural effusion is noted. Bony structures appear within normal limits. IMPRESSION: Bilateral pleural effusions left considerably greater than right. The right-sided effusion is slightly larger than that seen on the prior study.  Electronically Signed   By: Inez Catalina M.D.   On: 08/08/2021 03:40   ECHOCARDIOGRAM LIMITED  Result Date: 08/08/2021    ECHOCARDIOGRAM LIMITED REPORT   Patient Name:   [name] Date of Exam: 08/08/2021 Medical Rec #:  [id number]          Height:       62.0 in Accession #:    [id number]         Weight:       224.9 lb Date of Birth:  Jul 28, 1946           BSA:          2.009 m Patient Age:    75 years           BP:           101/66 mmHg Patient Gender: F                  HR:           85 bpm. Exam Location:  [facility] Procedure: Limited Echo, Limited Color Doppler and Cardiac Doppler Indications:     I31.3 Pericardial effusion  History:         Patient has prior history of Echocardiogram examinations, most                  recent 08/07/2021. HFpEF, CKD; Risk Factors:Hypertension,                  Diabetes and Dyslipidemia.  Sonographer:     Charmayne Sheer Referring Phys:  [phone] CHRISTOPHER END Diagnosing Phys: Kate Sable MD  Sonographer Comments: Suboptimal apical window and suboptimal subcostal window. Image acquisition challenging due to patient body habitus. IMPRESSIONS  1. Left ventricular ejection fraction, by estimation, is 60 to 65%. The left ventricle has normal function.  2. Right ventricular systolic function is normal. There is severely elevated pulmonary artery systolic pressure. The estimated right ventricular systolic pressure is 32.6 mmHg.  3. Large pleural effusion in the left lateral region.  4. The inferior vena cava is dilated in size with <50% respiratory variability, suggesting right atrial pressure of 15 mmHg. FINDINGS  Left Ventricle: Left ventricular ejection fraction, by estimation, is 60 to 65%. The left ventricle has normal function. Right Ventricle: No increase in right ventricular wall thickness.  Right ventricular systolic function is normal. There is severely elevated pulmonary artery systolic pressure. The tricuspid regurgitant velocity is 3.52 m/s, and with an assumed right  atrial pressure of 15 mmHg, the estimated right ventricular systolic pressure is 54.4 mmHg. Pericardium: Trivial pericardial effusion is present. Venous: The inferior vena cava is dilated in size with less than 50% respiratory variability, suggesting right atrial pressure of 15 mmHg. Additional Comments: There is a large pleural effusion in the left lateral region. LEFT VENTRICLE PLAX 2D LVIDd:         4.10 cm LVIDs:         3.00 cm LV PW:         1.20 cm LV IVS:        0.80 cm  LEFT ATRIUM         Index LA diam:    5.20 cm 2.59 cm/m  TRICUSPID VALVE TR Peak grad:   49.6 mmHg TR Vmax:        352.00 cm/s Kate Sable MD Electronically signed by Kate Sable MD Signature Date/Time: 08/08/2021/4:37:39 PM    Final     Cardiac Studies     Patient Profile     75 y.o. female with history of HFpEF, hypertension, CKD 3, permanent atrial fibrillation presenting with hypotension, GI bleed and large pericardial effusion s/p pericardial drainage placement.  Assessment & Plan    Pericardial effusion, hemorrhagic -S/p pericardiocentesis and drain placement with hemorrhagic effusion -Pericardial drainage tube removed yesterday Repeat echocardiogram tomorrow to look for reaccumulation of pericardial fluid -Could hold Eliquis until relook tomorrow   2.  HFpEF, volume overload -Still very edematous on exam Previous echocardiogram reviewed, elevated right heart pressures, markedly dilated IVC Renal function stable -We will increase torsemide 20 twice daily   3.  Permanent A. Fib Rate elevated on today's rounds, was better yesterday Will continue to monitor, may need low-dose metoprolol As outpatient was taking metoprolol succinate 50 daily with Eliquis  4.  Hypotension/shock In the setting of presumed lower GI bleed, "blow out" over the weekend Was weaned off pressors On midodrine 10 Metoprolol on hold for now Hemoglobin stable   Total encounter time more than 35 minutes  Greater than 50%  was spent in counseling and coordination of care with the patient   For questions or updates, please contact [place] Please consult [email or web address] for contact info under        Signed, Ida Rogue, MD  08/09/2021, 12:03 PM

## 2021-08-09 NOTE — Plan of Care (Signed)

## 2021-08-09 NOTE — Progress Notes (Signed)
[place] at [place] NAME: Karen Farley    MR#:  [id number]  DATE OF BIRTH:  02-22-1946  SUBJECTIVE:   whole lot better. She slept well. Eating well. Husband at bedside. Denies shortness of breath or chest pain. Did work with physical therapy and sat in the recliner yesterday REVIEW OF SYSTEMS:   Review of Systems  Constitutional:  Negative for chills, fever and weight loss.  HENT:  Negative for ear discharge, ear pain and nosebleeds.   Eyes:  Negative for blurred vision, pain and discharge.  Respiratory:  Negative for sputum production, shortness of breath, wheezing and stridor.   Cardiovascular:  Positive for leg swelling. Negative for chest pain, palpitations, orthopnea and PND.  Gastrointestinal:  Negative for abdominal pain, diarrhea, nausea and vomiting.  Genitourinary:  Negative for frequency and urgency.  Musculoskeletal:  Negative for back pain and joint pain.  Neurological:  Positive for weakness. Negative for sensory change, speech change and focal weakness.  Psychiatric/Behavioral:  Negative for depression and hallucinations. The patient is not nervous/anxious.   Tolerating Diet:yes Tolerating PT: HHPT  DRUG ALLERGIES:  No Known Allergies  VITALS:  Blood pressure 105/76, pulse 93, temperature 98.1 F (36.7 C), temperature source Oral, resp. rate (!) 23, height 5\' 2"  (1.575 m), weight 102 kg, SpO2 98 %.  PHYSICAL EXAMINATION:   Physical Exam  GENERAL:  75 y.o.-year-old patient lying in the bed with no acute distress. obese HEENT: Head atraumatic, normocephalic. Oropharynx and nasopharynx clear.  NECK:  Supple, no jugular venous distention. No thyroid enlargement, no tenderness.  LUNGS: decreased breath sounds bilaterally, no wheezing, rales, rhonchi. No use of accessory muscles of respiration.  CARDIOVASCULAR: S1, S2 normal. No murmurs, rubs, or gallops.  ABDOMEN: Soft, nontender, nondistended. Bowel sounds present. No  organomegaly or mass.  EXTREMITIES: Bilateral edema +++ NEUROLOGIC: non-focal PSYCHIATRIC:  patient is alert and oriented x 3.  SKIN: No obvious rash, lesion, or ulcer.   LABORATORY PANEL:  CBC Recent Labs  Lab 08/09/21 0532  WBC 8.2  HGB 9.7*  HCT 30.9*  PLT 186    Chemistries  Recent Labs  Lab 08/06/21 0510 08/07/21 0552 08/09/21 0532  NA 143   < > 141  K 3.4*   < > 3.3*  CL 104   < > 101  CO2 28   < > 34*  GLUCOSE 148*   < > 146*  BUN 67*   < > 35*  CREATININE 1.43*   < > 1.39*  CALCIUM 9.3   < > 8.9  MG 2.2   < > 2.0  AST 21  --   --   ALT 15  --   --   ALKPHOS 40  --   --   BILITOT 2.6*  --   --    < > = values in this interval not displayed.   Cardiac Enzymes No results for input(s): TROPONINI in the last 168 hours. RADIOLOGY:  DG Chest Port 1 View  Result Date: 08/08/2021 CLINICAL DATA:  Follow-up pleural effusion EXAM: PORTABLE CHEST 1 VIEW COMPARISON:  08/05/2021 FINDINGS: Cardiac shadow is enlarged but stable. Aortic calcifications are again seen. Left-sided pleural effusion is again identified and stable. Pericardial catheter is again identified and stable. Small right-sided pleural effusion is noted. Bony structures appear within normal limits. IMPRESSION: Bilateral pleural effusions left considerably greater than right. The right-sided effusion is slightly larger than that seen on the prior study. Electronically Signed  By: Inez Catalina M.D.   On: 08/08/2021 03:40   ECHOCARDIOGRAM LIMITED  Result Date: 08/08/2021    ECHOCARDIOGRAM LIMITED REPORT   Patient Name:   Karen Farley Date of Exam: 08/08/2021 Medical Rec #:  [id number]          Height:       62.0 in Accession #:    [id number]         Weight:       224.9 lb Date of Birth:  [birth year]           BSA:          2.009 m Patient Age:    33 years           BP:           101/66 mmHg Patient Gender: F                  HR:           85 bpm. Exam Location:  [facility] Procedure: Limited Echo, Limited Color  Doppler and Cardiac Doppler Indications:     I31.3 Pericardial effusion  History:         Patient has prior history of Echocardiogram examinations, most                  recent 08/07/2021. HFpEF, CKD; Risk Factors:Hypertension,                  Diabetes and Dyslipidemia.  Sonographer:     Charmayne Sheer Referring Phys:  [phone] CHRISTOPHER END Diagnosing Phys: Kate Sable MD  Sonographer Comments: Suboptimal apical window and suboptimal subcostal window. Image acquisition challenging due to patient body habitus. IMPRESSIONS  1. Left ventricular ejection fraction, by estimation, is 60 to 65%. The left ventricle has normal function.  2. Right ventricular systolic function is normal. There is severely elevated pulmonary artery systolic pressure. The estimated right ventricular systolic pressure is 02.6 mmHg.  3. Large pleural effusion in the left lateral region.  4. The inferior vena cava is dilated in size with <50% respiratory variability, suggesting right atrial pressure of 15 mmHg. FINDINGS  Left Ventricle: Left ventricular ejection fraction, by estimation, is 60 to 65%. The left ventricle has normal function. Right Ventricle: No increase in right ventricular wall thickness. Right ventricular systolic function is normal. There is severely elevated pulmonary artery systolic pressure. The tricuspid regurgitant velocity is 3.52 m/s, and with an assumed right atrial pressure of 15 mmHg, the estimated right ventricular systolic pressure is 37.8 mmHg. Pericardium: Trivial pericardial effusion is present. Venous: The inferior vena cava is dilated in size with less than 50% respiratory variability, suggesting right atrial pressure of 15 mmHg. Additional Comments: There is a large pleural effusion in the left lateral region. LEFT VENTRICLE PLAX 2D LVIDd:         4.10 cm LVIDs:         3.00 cm LV PW:         1.20 cm LV IVS:        0.80 cm  LEFT ATRIUM         Index LA diam:    5.20 cm 2.59 cm/m  TRICUSPID VALVE TR Peak grad:    49.6 mmHg TR Vmax:        352.00 cm/s Kate Sable MD Electronically signed by Kate Sable MD Signature Date/Time: 08/08/2021/4:37:39 PM    Final    ASSESSMENT AND PLAN:  Karen Farley is a 75  y.o. female with medical history significant for HFpEF, permanent atrial fibrillation on Eliquis, PAD, type 2 diabetes, CKD stage IIIb, lymphedema presented to hospital with increasing shortness of breath and edema.  Patient has been noticing increasing abdominal distention since her admission in August with severe lower extremity edema and dyspnea and exertion.  GI bleed with hemorrhagic shock. Patient was noted to have significant drop in hemoglobin to 9.7 from 12.4 and was transferred to the ICU.  GI following the patient at this time.  No further bowel movements reported.  Unable to do any GI intervention due to patient being hemodynamically unstable on vasopressors with large pericardial effusion.   Large pericardial effusion s/p percardiocentesis Large Pleural effusion s/p thoracentesis Patient underwent a pericardiocentesis on 08/05/2021 with removal of 700 mL of pleural fluid.  Inflammatory markers were unremarkable.  Cardiology monitoring the patient while in the hospital.  Still on vasopressors due to hypotension. --repeat echo 10/21    Left pleural effusion secondary to acute exacerbation of chronic diastolic CHF -status post thoracocentesis with removal of 600 mL of fluid on 07/28/2021. -Resumed torsemide 20 mg qd with IV albumin    Hypotension Secondary to hemorrhagic shock --Antihypertensives on hold.   --now weaned off IV phenylephrine drip  --cont po  midodrine.   --IV Albumin       Acute kidney injury on CKD stage 3b --Patient's baseline creatinine around 1.4-1.7.  Currently at baseline.   --Nephrology followed the patient during hospitalization.  -- Benazepril and spironolactone on hold.   Permanent atrial fibrillation --Was previously on Eliquis.  Currently on  hold due to GI bleed.  -- Rate controlled at this time--not on any po rate controlling meds   Type 2 diabetes mellitus Diet controlled at home.   --Continue sliding scale insulin while in the hospital.   Iron deficiency, patient received Venofer 200 mg IV daily for 5 days.   -cont oral iron   Vitamin B12 level 284, target >400, on vitamin B12 supplements.   Folic acid level 8.3.  On the lower range.  On folic acid   DVT prophylaxis: SCD/Compression stockings    Code Status: Full code     Family Communication:  Spoke with the patient's husband at bedside.   Disposition Plan:     Likely home in 2 -4 days   Consults called:  Gastroenterology, cardiology, nephrology, critical care   Admission status: Inpatient   Procedures:  Thoracocentesis on 07/28/2021 with removal of 600 mL fluid. CTA of the abdomen Pericardiocentesis. Level of care: Stepdown Status is: Inpatient   TOTAL TIME TAKING CARE OF THIS PATIENT: 25 minutes.  >50% time spent on counselling and coordination of care  Note: This dictation was prepared with Dragon dictation along with smaller phrase technology. Any transcriptional errors that result from this process are unintentional.  Fritzi Mandes M.D    [facility]   CC: Primary care physician; Valerie Roys, DO Patient ID: [name], female   DOB: [birth year], 75 y.o.   MRN: [id number]

## 2021-08-10 ENCOUNTER — Inpatient Hospital Stay (HOSPITAL_COMMUNITY)
Admit: 2021-08-10 | Discharge: 2021-08-10 | Disposition: A | Discharge status: Home / Self Care | Payer: Medicare PPO | Attending: Internal Medicine | Admitting: Internal Medicine

## 2021-08-10 DIAGNOSIS — J918 Pleural effusion in other conditions classified elsewhere: Secondary | ICD-10-CM | POA: Diagnosis not present

## 2021-08-10 DIAGNOSIS — I3139 Other pericardial effusion (noninflammatory): Secondary | ICD-10-CM

## 2021-08-10 DIAGNOSIS — I4821 Permanent atrial fibrillation: Secondary | ICD-10-CM | POA: Diagnosis not present

## 2021-08-10 DIAGNOSIS — R579 Shock, unspecified: Secondary | ICD-10-CM | POA: Diagnosis not present

## 2021-08-10 DIAGNOSIS — I358 Other nonrheumatic aortic valve disorders: Secondary | ICD-10-CM

## 2021-08-10 DIAGNOSIS — D62 Acute posthemorrhagic anemia: Secondary | ICD-10-CM | POA: Diagnosis not present

## 2021-08-10 LAB — CBC WITH DIFFERENTIAL/PLATELET
Abs Immature Granulocytes: 0.1 10*3/uL — ABNORMAL HIGH (ref 0.00–0.07)
Basophils Absolute: 0 10*3/uL (ref 0.0–0.1)
Basophils Relative: 0 %
Eosinophils Absolute: 0.4 10*3/uL (ref 0.0–0.5)
Eosinophils Relative: 5 %
HCT: 30.4 % — ABNORMAL LOW (ref 36.0–46.0)
Hemoglobin: 9.7 g/dL — ABNORMAL LOW (ref 12.0–15.0)
Immature Granulocytes: 1 %
Lymphocytes Relative: 7 %
Lymphs Abs: 0.6 10*3/uL — ABNORMAL LOW (ref 0.7–4.0)
MCH: 28.6 pg (ref 26.0–34.0)
MCHC: 31.9 g/dL (ref 30.0–36.0)
MCV: 89.7 fL (ref 80.0–100.0)
Monocytes Absolute: 0.8 10*3/uL (ref 0.1–1.0)
Monocytes Relative: 10 %
Neutro Abs: 6.2 10*3/uL (ref 1.7–7.7)
Neutrophils Relative %: 77 %
Platelets: 196 10*3/uL (ref 150–400)
RBC: 3.39 MIL/uL — ABNORMAL LOW (ref 3.87–5.11)
RDW: 25.8 % — ABNORMAL HIGH (ref 11.5–15.5)
Smear Review: NORMAL
WBC: 8.1 10*3/uL (ref 4.0–10.5)
nRBC: 0.4 % — ABNORMAL HIGH (ref 0.0–0.2)

## 2021-08-10 LAB — BASIC METABOLIC PANEL
Anion gap: 11 (ref 5–15)
BUN: 32 mg/dL — ABNORMAL HIGH (ref 8–23)
CO2: 32 mmol/L (ref 22–32)
Calcium: 8.7 mg/dL — ABNORMAL LOW (ref 8.9–10.3)
Chloride: 101 mmol/L (ref 98–111)
Creatinine, Ser: 1.47 mg/dL — ABNORMAL HIGH (ref 0.44–1.00)
GFR, Estimated: 37 mL/min — ABNORMAL LOW (ref >60)
Glucose, Bld: 151 mg/dL — ABNORMAL HIGH (ref 70–99)
Potassium: 3.3 mmol/L — ABNORMAL LOW (ref 3.5–5.1)
Sodium: 144 mmol/L (ref 135–145)

## 2021-08-10 LAB — APTT
aPTT: 200 seconds (ref 24–36)
aPTT: 83 seconds — ABNORMAL HIGH (ref 24–36)
aPTT: 93 seconds — ABNORMAL HIGH (ref 24–36)

## 2021-08-10 LAB — ECHOCARDIOGRAM LIMITED
Area-P 1/2: 4.3 cm2
Height: 62 in
S' Lateral: 2.8 cm
Weight: 3597.91 oz

## 2021-08-10 LAB — GLUCOSE, CAPILLARY
Glucose-Capillary: 143 mg/dL — ABNORMAL HIGH (ref 70–99)
Glucose-Capillary: 209 mg/dL — ABNORMAL HIGH (ref 70–99)
Glucose-Capillary: 214 mg/dL — ABNORMAL HIGH (ref 70–99)
Glucose-Capillary: 213 mg/dL — ABNORMAL HIGH (ref 70–99)

## 2021-08-10 LAB — HEPARIN LEVEL (UNFRACTIONATED): Heparin Unfractionated: 0.37 IU/mL (ref 0.30–0.70)

## 2021-08-10 LAB — PROTIME-INR
INR: 1.4 — ABNORMAL HIGH (ref 0.8–1.2)
Prothrombin Time: 17.5 seconds — ABNORMAL HIGH (ref 11.4–15.2)

## 2021-08-10 LAB — PHOSPHORUS: Phosphorus: 2 mg/dL — ABNORMAL LOW (ref 2.5–4.6)

## 2021-08-10 LAB — MAGNESIUM: Magnesium: 1.9 mg/dL (ref 1.7–2.4)

## 2021-08-10 MED ORDER — HYDROCORTISONE (PERIANAL) 2.5 % EX CREA
TOPICAL_CREAM | Freq: Three times a day (TID) | CUTANEOUS | Status: DC
  Administered 2021-08-13 – 2021-08-20 (×3): 1 via RECTAL
  Filled 2021-08-10 (×2): qty 28.35

## 2021-08-10 MED ORDER — HEPARIN BOLUS VIA INFUSION
4400.0000 [IU] | Freq: Once | INTRAVENOUS | Status: AC
  Administered 2021-08-10: 4400 [IU] via INTRAVENOUS
  Filled 2021-08-10: qty 4400

## 2021-08-10 MED ORDER — BISACODYL 5 MG PO TBEC
5.0000 mg | DELAYED_RELEASE_TABLET | Freq: Every day | ORAL | Status: DC | PRN
  Administered 2021-08-10: 5 mg via ORAL
  Filled 2021-08-10: qty 1

## 2021-08-10 MED ORDER — POTASSIUM CHLORIDE CRYS ER 20 MEQ PO TBCR
40.0000 meq | EXTENDED_RELEASE_TABLET | Freq: Once | ORAL | Status: AC
  Administered 2021-08-10: 40 meq via ORAL
  Filled 2021-08-10: qty 2

## 2021-08-10 MED ORDER — FUROSEMIDE 10 MG/ML IJ SOLN
40.0000 mg | Freq: Two times a day (BID) | INTRAMUSCULAR | Status: DC
  Administered 2021-08-10 – 2021-08-11 (×2): 40 mg via INTRAVENOUS
  Filled 2021-08-10 (×2): qty 4

## 2021-08-10 MED ORDER — POLYETHYLENE GLYCOL 3350 17 G PO PACK
17.0000 g | PACK | Freq: Every day | ORAL | Status: DC
  Administered 2021-08-10 – 2021-08-28 (×18): 17 g via ORAL
  Filled 2021-08-10 (×18): qty 1

## 2021-08-10 MED ORDER — METOPROLOL TARTRATE 25 MG PO TABS
12.5000 mg | ORAL_TABLET | Freq: Four times a day (QID) | ORAL | Status: DC
  Administered 2021-08-10 – 2021-08-20 (×23): 12.5 mg via ORAL
  Filled 2021-08-10 (×27): qty 1

## 2021-08-10 MED ORDER — FUROSEMIDE 10 MG/ML IJ SOLN
40.0000 mg | Freq: Once | INTRAMUSCULAR | Status: AC
  Administered 2021-08-10: 40 mg via INTRAVENOUS
  Filled 2021-08-10: qty 4

## 2021-08-10 MED ORDER — HEPARIN (PORCINE) 25000 UT/250ML-% IV SOLN
1050.0000 [IU]/h | INTRAVENOUS | Status: DC
  Administered 2021-08-10 – 2021-08-11 (×2): 1050 [IU]/h via INTRAVENOUS
  Filled 2021-08-10 (×2): qty 250

## 2021-08-10 NOTE — Progress Notes (Signed)
Physical Therapy Treatment Patient Details Name: Karen Farley MRN: [id number] DOB: Apr 18, 1946 Today's Date: 08/10/2021   History of Present Illness 75 y/o female history HFpEF, Afib on Eliquis, PAD, DM, CKDIII, lymphedema, HTN admitted to hospital 10/6 with sob, orthopnea, weight gain with hypotension, large left pleural effusion, generalized anasarca on admission. S/p left thoracentesis 10/8 685ml removed and paracentesis 10/12 1.2L removed. Failed Lasix IV advanced to Lasix gtt; however, Lasix and Eliquis stopped 10/15 and patient transferred to ICU after had episode of GI bleed with drop in hgb, s/p 2U pRBC and vasopressor support initiated. Echo done 10/16 showed large pericardial effusion s/p pericardiocentesis 745mL fluid removed with drain placed.    PT Comments    Pt received sitting in recliner, agreeable to therapy and reporting she needs to use the restroom. PT assisted pt to stand-pivot to [facility]. She required CGA to steady during STS and MIN A to correct posterior LOB due to stumble. Once positioned on commode, pt states "it could be awhile". PT offers privacy by stepping out; RN notified of pt status. PT will attempt to return later this afternoon as time allows to provide further PT intervention. Pt agreeable to plan. Would benefit from skilled PT to address above deficits and promote optimal return to PLOF.   Recommendations for follow up therapy are one component of a multi-disciplinary discharge planning process, led by the attending physician.  Recommendations may be updated based on patient status, additional functional criteria and insurance authorization.  Follow Up Recommendations  Home health PT;Supervision for mobility/OOB (pt indicates that she does not think she will need HHPT by d/c)     Equipment Recommendations  Rolling walker with 5" wheels    Recommendations for Other Services       Precautions / Restrictions Precautions Precautions:  Fall Restrictions Weight Bearing Restrictions: No     Mobility  Bed Mobility               General bed mobility comments: not assessed - pt in chair at beginning of session, BSC at end of session (RN notified)    Transfers Overall transfer level: Needs assistance Equipment used: Rolling walker (2 wheeled) Transfers: Sit to/from [facility] Sit to Stand: Min guard Stand pivot transfers: Min assist       General transfer comment: CGA to steady upon standing, pt reaching out of BOS for [facility] handle. Pt did stumble during stand pivot requiring MIN A to prevent posterior LOB  Ambulation/Gait             General Gait Details: deferred - stand pivot to [facility]. Session ended with pt on BSC.   Stairs             Wheelchair Mobility    Modified Rankin (Stroke Patients Only)       Balance Overall balance assessment: Needs assistance   Sitting balance-Leahy Scale: Good     Standing balance support: During functional activity Standing balance-Leahy Scale: Poor Standing balance comment: no AD used for stand pivot to [facility] with pt stumbling and posteior LOB, PT correcting                            Cognition Arousal/Alertness: Awake/alert Behavior During Therapy: WFL for tasks assessed/performed Overall Cognitive Status: Within Functional Limits for tasks assessed  Exercises      General Comments        Pertinent Vitals/Pain Pain Assessment: No/denies pain    Home Living                      Prior Function            PT Goals (current goals can now be found in the care plan section) Acute Rehab PT Goals Patient Stated Goal: to regain independence PT Goal Formulation: With patient Time For Goal Achievement: 08/29/21 Potential to Achieve Goals: Good    Frequency    Min 2X/week      PT Plan      Co-evaluation              AM-PAC PT "6  Clicks" Mobility   Outcome Measure  Help needed turning from your back to your side while in a flat bed without using bedrails?: A Little Help needed moving from lying on your back to sitting on the side of a flat bed without using bedrails?: A Little Help needed moving to and from a bed to a chair (including a wheelchair)?: A Little Help needed standing up from a chair using your arms (e.g., wheelchair or bedside chair)?: A Little Help needed to walk in hospital room?: A Little Help needed climbing 3-5 steps with a railing? : A Lot 6 Click Score: 17    End of Session Equipment Utilized During Treatment: Gait belt Activity Tolerance: Patient tolerated treatment well Patient left: with call bell/phone within reach;Other (comment);with family/visitor present (on [facility] - RN aware. Husband in and out of room to check on pt.) Nurse Communication: Mobility status (needs new PureWick) PT Visit Diagnosis: Muscle weakness (generalized) (M62.81);Difficulty in walking, not elsewhere classified (R26.2);Unsteadiness on feet (R26.81)     Time: 9407-6808 PT Time Calculation (min) (ACUTE ONLY): 8 min  Charges:  $Therapeutic Activity: 8-22 mins                     Patrina Levering PT, DPT 08/10/21 3:12 PM [phone]

## 2021-08-10 NOTE — Progress Notes (Signed)
[place] at [place] NAME: Karen Farley    MR#:  [id number]  DATE OF BIRTH:  [birth year]  SUBJECTIVE:   whole lot better. She slept well. Eating well. Husband at bedside. Denies shortness of breath or chest pain.  Sat in the recliner consitpation REVIEW OF SYSTEMS:   Review of Systems  Constitutional:  Negative for chills, fever and weight loss.  HENT:  Negative for ear discharge, ear pain and nosebleeds.   Eyes:  Negative for blurred vision, pain and discharge.  Respiratory:  Negative for sputum production, shortness of breath, wheezing and stridor.   Cardiovascular:  Positive for leg swelling. Negative for chest pain, palpitations, orthopnea and PND.  Gastrointestinal:  Negative for abdominal pain, diarrhea, nausea and vomiting.  Genitourinary:  Negative for frequency and urgency.  Musculoskeletal:  Negative for back pain and joint pain.  Neurological:  Positive for weakness. Negative for sensory change, speech change and focal weakness.  Psychiatric/Behavioral:  Negative for depression and hallucinations. The patient is not nervous/anxious.   Tolerating Diet:yes Tolerating PT: HHPT  DRUG ALLERGIES:  No Known Allergies  VITALS:  Blood pressure (!) 112/91, pulse 96, temperature 98.5 F (36.9 C), temperature source Oral, resp. rate (!) 24, height 5\' 2"  (1.575 m), weight 102 kg, SpO2 94 %.  PHYSICAL EXAMINATION:   Physical Exam  GENERAL:  75 y.o.-year-old patient lying in the bed with no acute distress. obese HEENT: Head atraumatic, normocephalic. Oropharynx and nasopharynx clear.  NECK:  Supple, no jugular venous distention. No thyroid enlargement, no tenderness.  LUNGS: decreased breath sounds bilaterally, no wheezing, rales, rhonchi. No use of accessory muscles of respiration.  CARDIOVASCULAR: S1, S2 normal. No murmurs, rubs, or gallops.  ABDOMEN: Soft, nontender, nondistended. Bowel sounds present. No organomegaly or mass.   EXTREMITIES: Bilateral edema ++ NEUROLOGIC: non-focal PSYCHIATRIC:  patient is alert and oriented x 3.  SKIN: No obvious rash, lesion, or ulcer.   LABORATORY PANEL:  CBC Recent Labs  Lab 08/10/21 0809  WBC 8.1  HGB 9.7*  HCT 30.4*  PLT 196     Chemistries  Recent Labs  Lab 08/06/21 0510 08/07/21 0552 08/10/21 0809  NA 143   < > 144  K 3.4*   < > 3.3*  CL 104   < > 101  CO2 28   < > 32  GLUCOSE 148*   < > 151*  BUN 67*   < > 32*  CREATININE 1.43*   < > 1.47*  CALCIUM 9.3   < > 8.7*  MG 2.2   < > 1.9  AST 21  --   --   ALT 15  --   --   ALKPHOS 40  --   --   BILITOT 2.6*  --   --    < > = values in this interval not displayed.    Cardiac Enzymes No results for input(s): TROPONINI in the last 168 hours. RADIOLOGY:  ECHOCARDIOGRAM LIMITED  Result Date: 08/10/2021    ECHOCARDIOGRAM LIMITED REPORT   Patient Name:   Karen Farley Date of Exam: 08/10/2021 Medical Rec #:  [id number]          Height:       62.0 in Accession #:    [id number]         Weight:       224.9 lb Date of Birth:  1946/04/14           BSA:  2.009 m Patient Age:    75 years           BP:           105/70 mmHg Patient Gender: F                  HR:           110 bpm. Exam Location:  [facility] Procedure: 2D Echo, Limited Color Doppler and Cardiac Doppler Indications:     I31.3 Pericardial effusion  History:         Patient has prior history of Echocardiogram examinations, most                  recent 08/08/2021. HFpEF, CKD; Risk Factors:Diabetes,                  Hypertension and Dyslipidemia.  Sonographer:     Charmayne Sheer Referring Phys:  [id number] CHRISTOPHER END Diagnosing Phys: Nelva Bush MD  Sonographer Comments: Technically difficult study due to poor echo windows. Image acquisition challenging due to patient body habitus. IMPRESSIONS  1. Left ventricular ejection fraction, by estimation, is 60 to 65%. The left ventricle has normal function. There is mild left ventricular hypertrophy.  2. Right  ventricular systolic function is normal. The right ventricular size is normal. There is severely elevated pulmonary artery systolic pressure.  3. A small pericardial effusion is present. The pericardial effusion is posterior to the left ventricle and localized near the right atrium.  4. The aortic valve has an indeterminant number of cusps. There is moderate calcification of the aortic valve. There is moderate thickening of the aortic valve.  5. The inferior vena cava is dilated in size with <50% respiratory variability, suggesting right atrial pressure of 15 mmHg. FINDINGS  Left Ventricle: Left ventricular ejection fraction, by estimation, is 60 to 65%. The left ventricle has normal function. There is mild left ventricular hypertrophy. Right Ventricle: The right ventricular size is normal. No increase in right ventricular wall thickness. Right ventricular systolic function is normal. There is severely elevated pulmonary artery systolic pressure. The tricuspid regurgitant velocity is 3.42 m/s, and with an assumed right atrial pressure of 15 mmHg, the estimated right ventricular systolic pressure is 70.4 mmHg. Pericardium: A small pericardial effusion is present. The pericardial effusion is posterior to the left ventricle and localized near the right atrium. Presence of pericardial fat pad. Mitral Valve: There is mild thickening of the mitral valve leaflet(s). Mild mitral annular calcification. MV peak gradient, 15.1 mmHg. The mean mitral valve gradient is 7.0 mmHg. Tricuspid Valve: The tricuspid valve is not well visualized. Aortic Valve: The aortic valve has an indeterminant number of cusps. There is moderate calcification of the aortic valve. There is moderate thickening of the aortic valve. Venous: The inferior vena cava is dilated in size with less than 50% respiratory variability, suggesting right atrial pressure of 15 mmHg. LEFT VENTRICLE PLAX 2D LVIDd:         4.30 cm Diastology LVIDs:         2.80 cm LV e'  medial:    7.29 cm/s LV PW:         0.90 cm LV E/e' medial:  22.3 LV IVS:        0.90 cm LV e' lateral:   9.36 cm/s                        LV E/e' lateral: 17.4  LEFT ATRIUM  Index LA diam:    4.60 cm 2.29 cm/m  MITRAL VALVE                TRICUSPID VALVE MV Area (PHT): 4.30 cm     TR Peak grad:   46.8 mmHg MV Peak grad:  15.1 mmHg    TR Vmax:        342.00 cm/s MV Mean grad:  7.0 mmHg MV Vmax:       1.94 m/s MV Vmean:      121.0 cm/s MV Decel Time: 177 msec MV E velocity: 162.50 cm/s Nelva Bush MD Electronically signed by Nelva Bush MD Signature Date/Time: 08/10/2021/11:59:21 AM    Final    ASSESSMENT AND PLAN:  Karen Farley is a 75 y.o. female with medical history significant for HFpEF, permanent atrial fibrillation on Eliquis, PAD, type 2 diabetes, CKD stage IIIb, lymphedema presented to hospital with increasing shortness of breath and edema.  Patient has been noticing increasing abdominal distention since her admission in August with severe lower extremity edema and dyspnea and exertion.  GI bleed with hemorrhagic shock. Patient was noted to have significant drop in hemoglobin to 9.7 from 12.4 and was transferred to the ICU.  GI following the patient at this time.  No further bowel movements reported.  Unable to do any GI intervention due to patient being hemodynamically unstable on vasopressors with large pericardial effusion.   Large pericardial effusion s/p percardiocentesis Large Pleural effusion s/p thoracentesis Patient underwent a pericardiocentesis on 08/05/2021 with removal of 700 mL of pleural fluid.  Inflammatory markers were unremarkable.  Cardiology monitoring the patient while in the hospital.  Still on vasopressors due to hypotension. --repeat echo 10/21    Left pleural effusion secondary to acute exacerbation of chronic diastolic CHF -status post thoracocentesis with removal of 600 mL of fluid on 07/28/2021. --10/20-- torsemide 20 mg qd with IV  albumin --10/21-- IV lqasix 40 mg bid per cardiology    Hypotension Secondary to hemorrhagic shock --Antihypertensives on hold.   --cont po  midodrine.   --IV Albumin       Acute kidney injury on CKD stage 3b --Patient's baseline creatinine around 1.4-1.7.  Currently at baseline.   --Nephrology followed the patient during hospitalization.  -- Benazepril and spironolactone on hold.   Permanent atrial fibrillation --Was previously on Eliquis.  Currently on hold due to GI bleed.  -- Rate controlled at this time--not on any po rate controlling meds --10/21--started on IV heparin gtt--monitor hgb   Type 2 diabetes mellitus Diet controlled at home.   --Continue sliding scale insulin while in the hospital.   Iron deficiency, patient received Venofer 200 mg IV daily for 5 days.   -cont oral iron   Vitamin B12 level 284, target >400, on vitamin B12 supplements.   Folic acid level 8.3.  On the lower range.  On folic acid   DVT prophylaxis: SCD/Compression stockings    Code Status: Full code     Family Communication:  Spoke with the patient's husband at bedside.   Disposition Plan:     Likely home in 2 -4 days   Consults called:  Gastroenterology, cardiology, nephrology, critical care   Admission status: Inpatient   Procedures:  Thoracocentesis on 07/28/2021 with removal of 600 mL fluid. CTA of the abdomen Pericardiocentesis. Level of care: Stepdown Status is: Inpatient   TOTAL TIME TAKING CARE OF THIS PATIENT: 25 minutes.  >50% time spent on counselling and coordination of care  Note: This  dictation was prepared with Dragon dictation along with smaller phrase technology. Any transcriptional errors that result from this process are unintentional.  Fritzi Mandes M.D    [facility]   CC: Primary care physician; Valerie Roys, DO Patient ID: Karen Farley, female   DOB: 1946/08/03, 75 y.o.   MRN: [id number]

## 2021-08-10 NOTE — Progress Notes (Signed)
Progress Note  Patient Name: Karen Farley Date of Encounter: 08/10/2021  [facility] Cardiologist: Kate Sable, MD   Subjective   Breathing a little better but still short of breath with minimal activity.  No chest pain.  Swelling has gotten worse since yesterday.  Inpatient Medications    Scheduled Meds:  Chlorhexidine Gluconate Cloth  6 each Topical Daily   folic acid  1 mg Oral Daily   insulin aspart  0-5 Units Subcutaneous QHS   insulin aspart  0-9 Units Subcutaneous TID WC   iron polysaccharides  150 mg Oral Daily   latanoprost  1 drop Both Eyes QHS   midodrine  10 mg Oral TID WC   pantoprazole (PROTONIX) IV  40 mg Intravenous Q12H   polyethylene glycol  17 g Oral Daily   Ensure Max Protein  11 oz Oral BID   torsemide  20 mg Oral BID   vitamin B-12  500 mcg Oral Daily   Continuous Infusions:  sodium chloride Stopped (08/05/21 1441)   albumin human Stopped (08/10/21 1020)   PRN Meds: acetaminophen, bisacodyl, lidocaine HCl (PF), lip balm, traMADol   Vital Signs    Vitals:   08/10/21 0600 08/10/21 0800 08/10/21 1000 08/10/21 1200  BP: 90/67 105/70 (!) 113/91 (!) 112/91  Pulse: 96     Resp: (!) 30 (!) 25 (!) 26 19  Temp:  98.1 F (36.7 C)  98.5 F (36.9 C)  TempSrc:  Oral  Oral  SpO2: 94% 94%  94%  Weight:      Height:        Intake/Output Summary (Last 24 hours) at 08/10/2021 1238 Last data filed at 08/10/2021 1234 Gross per 24 hour  Intake 838.82 ml  Output 2125 ml  Net -1286.18 ml   Last 3 Weights 08/06/2021 08/05/2021 08/04/2021  Weight (lbs) 224 lb 13.9 oz 223 lb 5.2 oz 224 lb 3.3 oz  Weight (kg) 102 kg 101.3 kg 101.7 kg      Telemetry    A-fib (rate 90-130 bpm) - Personally Reviewed  ECG    No new tracing.  Physical Exam   GEN: No acute distress.   Neck: NJVP 10-12 cm. Cardiac: Irregularly irregular rhythm w/o murmurs. Respiratory: Diminished breath sounds, absent at the bases.  No wheezes or crackles. GI: Mildly  distended. MS: 2+ arm and leg edema. Neuro:  Nonfocal  Psych: Normal affect   Labs    High Sensitivity Troponin:   Recent Labs  Lab 07/26/21 1708  TROPONINIHS 17     Chemistry Recent Labs  Lab 08/06/21 0510 08/07/21 0552 08/08/21 0445 08/09/21 0532 08/10/21 0809  NA 143 144 141 141 144  K 3.4* 3.4* 3.6 3.3* 3.3*  CL 104 104 102 101 101  CO2 28 33* 30 34* 32  GLUCOSE 148* 147* 158* 146* 151*  BUN 67* 51* 37* 35* 32*  CREATININE 1.43* 1.30* 1.33* 1.39* 1.47*  CALCIUM 9.3 9.2 8.9 8.9 8.7*  MG 2.2 2.1  --  2.0 1.9  PROT 5.3*  --   --   --   --   ALBUMIN 3.6  --   --   --   --   AST 21  --   --   --   --   ALT 15  --   --   --   --   ALKPHOS 40  --   --   --   --   BILITOT 2.6*  --   --   --   --  GFRNONAA 38* 43* 42* 40* 37*  ANIONGAP 11 7 9 6 11     Lipids No results for input(s): CHOL, TRIG, HDL, LABVLDL, LDLCALC, CHOLHDL in the last 168 hours.  Hematology Recent Labs  Lab 08/08/21 0445 08/09/21 0532 08/10/21 0809  WBC 10.5 8.2 8.1  RBC 3.42* 3.50* 3.39*  HGB 9.3* 9.7* 9.7*  HCT 30.0* 30.9* 30.4*  MCV 87.7 88.3 89.7  MCH 27.2 27.7 28.6  MCHC 31.0 31.4 31.9  RDW 23.4* 24.9* 25.8*  PLT 192 186 196   Thyroid No results for input(s): TSH, FREET4 in the last 168 hours.  BNP Recent Labs  Lab 08/05/21 0444  BNP 184.5*    DDimer No results for input(s): DDIMER in the last 168 hours.   Radiology    ECHOCARDIOGRAM LIMITED  Result Date: 08/10/2021    ECHOCARDIOGRAM LIMITED REPORT   Patient Name:   Karen Farley Date of Exam: 08/10/2021 Medical Rec #:  [id number]          Height:       62.0 in Accession #:    [id number]         Weight:       224.9 lb Date of Birth:  Apr 13, 1946           BSA:          2.009 m Patient Age:    75 years           BP:           105/70 mmHg Patient Gender: F                  HR:           110 bpm. Exam Location:  [facility] Procedure: 2D Echo, Limited Color Doppler and Cardiac Doppler Indications:     I31.3 Pericardial effusion   History:         Patient has prior history of Echocardiogram examinations, most                  recent 08/08/2021. HFpEF, CKD; Risk Factors:Diabetes,                  Hypertension and Dyslipidemia.  Sonographer:     Charmayne Sheer Referring Phys:  [phone] Shaquilla Kehres Diagnosing Phys: Nelva Bush MD  Sonographer Comments: Technically difficult study due to poor echo windows. Image acquisition challenging due to patient body habitus. IMPRESSIONS  1. Left ventricular ejection fraction, by estimation, is 60 to 65%. The left ventricle has normal function. There is mild left ventricular hypertrophy.  2. Right ventricular systolic function is normal. The right ventricular size is normal. There is severely elevated pulmonary artery systolic pressure.  3. A small pericardial effusion is present. The pericardial effusion is posterior to the left ventricle and localized near the right atrium.  4. The aortic valve has an indeterminant number of cusps. There is moderate calcification of the aortic valve. There is moderate thickening of the aortic valve.  5. The inferior vena cava is dilated in size with <50% respiratory variability, suggesting right atrial pressure of 15 mmHg. FINDINGS  Left Ventricle: Left ventricular ejection fraction, by estimation, is 60 to 65%. The left ventricle has normal function. There is mild left ventricular hypertrophy. Right Ventricle: The right ventricular size is normal. No increase in right ventricular wall thickness. Right ventricular systolic function is normal. There is severely elevated pulmonary artery systolic pressure. The tricuspid regurgitant velocity is 3.42 m/s, and with an assumed  right atrial pressure of 15 mmHg, the estimated right ventricular systolic pressure is 16.1 mmHg. Pericardium: A small pericardial effusion is present. The pericardial effusion is posterior to the left ventricle and localized near the right atrium. Presence of pericardial fat pad. Mitral Valve: There is  mild thickening of the mitral valve leaflet(s). Mild mitral annular calcification. MV peak gradient, 15.1 mmHg. The mean mitral valve gradient is 7.0 mmHg. Tricuspid Valve: The tricuspid valve is not well visualized. Aortic Valve: The aortic valve has an indeterminant number of cusps. There is moderate calcification of the aortic valve. There is moderate thickening of the aortic valve. Venous: The inferior vena cava is dilated in size with less than 50% respiratory variability, suggesting right atrial pressure of 15 mmHg. LEFT VENTRICLE PLAX 2D LVIDd:         4.30 cm Diastology LVIDs:         2.80 cm LV e' medial:    7.29 cm/s LV PW:         0.90 cm LV E/e' medial:  22.3 LV IVS:        0.90 cm LV e' lateral:   9.36 cm/s                        LV E/e' lateral: 17.4  LEFT ATRIUM         Index LA diam:    4.60 cm 2.29 cm/m  MITRAL VALVE                TRICUSPID VALVE MV Area (PHT): 4.30 cm     TR Peak grad:   46.8 mmHg MV Peak grad:  15.1 mmHg    TR Vmax:        342.00 cm/s MV Mean grad:  7.0 mmHg MV Vmax:       1.94 m/s MV Vmean:      121.0 cm/s MV Decel Time: 177 msec MV E velocity: 162.50 cm/s Nelva Bush MD Electronically signed by Nelva Bush MD Signature Date/Time: 08/10/2021/11:59:21 AM    Final     Cardiac Studies   See above  Patient Profile     75 y.o. female with history of permanent atrial fibrillation, hypertension, chronic HFpEF, chronic kidney disease stage IIIa, and hyperlipidemia, admitted with concern for GI bleed and found to have hypotension and large pericardial effusion status post pericardiocentesis.  Assessment & Plan    Pericardial effusion: Limited echo today shows small effusion.  No need for repeat tap or consideration of pericardial window.  Etiology remains uncertain, but suspect pericarditis complicated by hemorrhage in the setting of anticoagulation. - Consider repeat echo early next week, especially if anticoagulation is restarted.  Permanent atrial  fibrillation: HR trending up.  AC remains on hold. - Start metoprolol tartrate 12.5 mg Q6 hours for HR control. - Start IV heparin with repeat echo in 1-2 days (sooner if hemodynamics worsen) to ensure effusion is not enlarging.  Shock: Improved, though still on midodrine.  Hemoglobin stable w/o evidence of recurrent bleeding. - Wean midodrine as tolerated. - Start heparin if ok from GI standpoint.  Anasarca: Etiology uncertain (? Cirrhosis).  Patient appears more edematous today. - Switch torsemide to furosemide 40 mg IV BID.  Aortic stenosis: Valve appears quite thickened on today's limited echo.  Prior full echo this admission suggested mild-moderate AS. - Consider reassess AoV gradients with next echo, now that pericardial effusion is smaller.   For questions or updates, please contact [place] Please  consult [email or web address] for contact info under [facility].     Signed, Nelva Bush, MD  08/10/2021, 12:38 PM

## 2021-08-10 NOTE — Progress Notes (Signed)
ANTICOAGULATION CONSULT NOTE  Pharmacy Consult for heparin Indication: atrial fibrillation  No Known Allergies  Patient Measurements: Height: 5\' 2"  (157.5 cm) Weight: 102 kg (224 lb 13.9 oz) IBW/kg (Calculated) : 50.1 Heparin Dosing Weight: 74 kg  Vital Signs: Temp: 98.5 F (36.9 C) (10/21 1200) Temp Source: Oral (10/21 1200) BP: 112/91 (10/21 1200) Pulse Rate: 96 (10/21 0600)  Labs: Recent Labs    08/08/21 0445 08/09/21 0532 08/10/21 0809  HGB 9.3* 9.7* 9.7*  HCT 30.0* 30.9* 30.4*  PLT 192 186 196  CREATININE 1.33* 1.39* 1.47*    Estimated Creatinine Clearance: 37 mL/min (A) (by C-G formula based on SCr of 1.47 mg/dL (H)).   Medical History: Past Medical History:  Diagnosis Date   Chronic heart failure with preserved ejection fraction (HFpEF) ([place])    a. 11/2019 Echo: EF 60-65%, no rwma, Nl RV size/fxn. Mod dil LA.   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 Patient with pericardial effusion requiring drain placement. Suspect pericarditis complicated by hemorrhage in the setting of anticoagulation. Eliquis has been on hold. Drain has now been removed with repeat echo showing small effusion with no need for repeat tap or pericardial window. Cardiology has consulted for heparin with plan to repeat echo in 24-48 hours to ensure effusion is not enlarging.  Goal of Therapy:  Heparin level 0.3-0.7 units/ml aPTT 66-102 seconds Monitor platelets by anticoagulation protocol: Yes   Plan:  Heparin 4400 unit bolus Start heparin infusion at 1050 units/hr Check aPTT and HL at 2200 (expect may be able to follow  HL given Eliquis has been on hold, last dose 10/15 0800) CBC daily while on heparin  Tawnya Crook, PharmD, BCPS Clinical Pharmacist 08/10/2021 1:43 PM

## 2021-08-10 NOTE — Progress Notes (Signed)
ANTICOAGULATION CONSULT NOTE  Pharmacy Consult for heparin Indication: atrial fibrillation  No Known Allergies  Patient Measurements: Height: 5\' 2"  (157.5 cm) Weight: 102 kg (224 lb 13.9 oz) IBW/kg (Calculated) : 50.1 Heparin Dosing Weight: 74 kg  Vital Signs: Temp: 98.5 F (36.9 C) (10/21 1200) Temp Source: Oral (10/21 1200) BP: 112/91 (10/21 1200)  Labs: Recent Labs    08/08/21 0445 08/09/21 0532 08/10/21 0809 08/10/21 1412 08/10/21 1546 08/10/21 2149  HGB 9.3* 9.7* 9.7*  --   --   --   HCT 30.0* 30.9* 30.4*  --   --   --   PLT 192 186 196  --   --   --   APTT  --   --   --  200* 83* 93*  LABPROT  --   --   --  17.5*  --   --   INR  --   --   --  1.4*  --   --   HEPARINUNFRC  --   --   --   --   --  0.37  CREATININE 1.33* 1.39* 1.47*  --   --   --      Estimated Creatinine Clearance: 37 mL/min (A) (by C-G formula based on SCr of 1.47 mg/dL (H)).   Medical History: Past Medical History:  Diagnosis Date   Chronic heart failure with preserved ejection fraction (HFpEF) ([place])    a. 11/2019 Echo: EF 60-65%, no rwma, Nl RV size/fxn. Mod dil LA.   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 Patient with pericardial effusion requiring drain placement. Suspect pericarditis complicated by hemorrhage in the setting of anticoagulation. Eliquis has been on hold. Drain has now been removed with repeat echo showing small effusion with no need for repeat tap or pericardial window. Cardiology has consulted for heparin with plan to repeat echo in 24-48 hours to ensure effusion is not  enlarging.  Date Time aPTT/HL Rate/Comment 1015 0800 --- / ---  Last reported dose of Eliquis --- 10/21 2149 93s / 0.37 Therapeutic x1; 1050 un/hr (correlating)      Baseline Labs: aPTT - unk INR - 1.4 Hgb - 9.7 >  Plts - 196 >  Goal of Therapy:  Heparin level 0.3-0.7 units/ml aPTT 66-102 seconds Monitor platelets by anticoagulation protocol: Yes   Plan:  Heparin level therapeutic x1 at 0.37; correlation with aPTT. Will titrate by anti-Xa alone now. Continue current heparin infusion at 1050 units/hr Recheck HL in 8hrs (10/22 0600) CTM CBC daily while on heparin.  Lorna Dibble, PharmD, [facility] Clinical Pharmacist 08/10/2021 10:19 PM

## 2021-08-10 NOTE — Progress Notes (Signed)
[facility]  ROUNDING NOTE   Subjective:   [name] was admitted to [facility] on 07/26/2021 for Morbid obesity ([place]) [E66.01] DOE (dyspnea on exertion) [R06.09] Recurrent left pleural effusion [J90] Hypotension [I95.9] Type 2 diabetes mellitus without complication, without long-term current use of insulin ([place]) [E11.9] Atrial fibrillation, unspecified type ([place]) [I48.91] Chronic congestive heart failure, unspecified heart failure type [facility]) [I50.9]  Patient was last seen by my partner, Dr. Candiss Norse, on 05/31/21. Where torsemide was restarted and to continue metolazone.   Patient admitted with shortness of breath and increasing peripheral edema. She was then started on IV furosemide. Left thoracentesis done on 07/28/21  675mL of pleural fluid.   Patient feels much better today ECHO at bedside during visit Concerned lower extremity edema returning Per RN, patient able to sit in chair yesterday, tolerated well  Objective:  Vital signs in last 24 hours:  Temp:  [98 F (36.7 C)-98.6 F (37 C)] 98.6 F (37 C) (10/21 0240) Pulse Rate:  [89-109] 96 (10/21 0600) Resp:  [17-30] 30 (10/21 0600) BP: (90-120)/(57-81) 90/67 (10/21 0600) SpO2:  [91 %-98 %] 94 % (10/21 0600)  Weight change:  Filed Weights   08/04/21 2020 08/05/21 0413 08/06/21 0253  Weight: 101.7 kg 101.3 kg 102 kg    Intake/Output: I/O last 3 completed shifts: In: 936.6 [P.O.:840; IV Piggyback:96.6] Out: 5643 [Urine:3275]   Intake/Output this shift:  No intake/output data recorded.  Physical Exam: General: NAD, resting in bed  Head: Normocephalic, atraumatic. Moist oral mucosal membranes  Eyes: Anicteric  Lungs:  Clear bilaterally, normal effort  Heart: Irregular rhythm  Abdomen:  Soft, nontender  Extremities:  + peripheral edema.  Neurologic: Nonfocal, moving all four extremities  Skin: Minimal blistering        Basic Metabolic Panel: Recent Labs  Lab 08/04/21 0504 08/05/21 0444  08/06/21 0510 08/07/21 0552 08/08/21 0445 08/09/21 0532  NA 136 140 143 144 141 141  K 3.8 4.0 3.4* 3.4* 3.6 3.3*  CL 93* 100 104 104 102 101  CO2 31 30 28  33* 30 34*  GLUCOSE 214* 244* 148* 147* 158* 146*  BUN 62* 87* 67* 51* 37* 35*  CREATININE 1.75* 1.55* 1.43* 1.30* 1.33* 1.39*  CALCIUM 8.9 8.9 9.3 9.2 8.9 8.9  MG 2.2 2.1 2.2 2.1  --  2.0  PHOS 2.7 3.7 3.5 3.1  --  2.3*     Liver Function Tests: Recent Labs  Lab 08/06/21 0510  AST 21  ALT 15  ALKPHOS 40  BILITOT 2.6*  PROT 5.3*  ALBUMIN 3.6    No results for input(s): LIPASE, AMYLASE in the last 168 hours. No results for input(s): AMMONIA in the last 168 hours.  CBC: Recent Labs  Lab 08/06/21 0510 08/06/21 2159 08/07/21 0552 08/08/21 0445 08/09/21 0532 08/10/21 0809  WBC 14.6*  --  13.4* 10.5 8.2 8.1  NEUTROABS 10.8*  --   --   --  6.2 PENDING  HGB 10.3* 9.3* 9.4* 9.3* 9.7* 9.7*  HCT 31.9* 29.5* 29.1* 30.0* 30.9* 30.4*  MCV 86.2  --  86.6 87.7 88.3 89.7  PLT 227  --  205 192 186 196     Cardiac Enzymes: No results for input(s): CKTOTAL, CKMB, CKMBINDEX, TROPONINI in the last 168 hours.  BNP: Invalid input(s): POCBNP  CBG: Recent Labs  Lab 08/09/21 0847 08/09/21 1126 08/09/21 1633 08/09/21 2150 08/10/21 0823  GLUCAP 130* 177* 189* 180* 143*     Microbiology: Results for orders placed or performed  during the hospital encounter of 07/26/21  Resp Panel by RT-PCR (Flu A&B, Covid) Nasopharyngeal Swab     Status: None   Collection Time: 07/26/21  7:19 PM   Specimen: Nasopharyngeal Swab; Nasopharyngeal(NP) swabs in vial transport medium  Result Value Ref Range Status   SARS Coronavirus 2 by RT PCR NEGATIVE NEGATIVE Final    Comment: (NOTE) SARS-CoV-2 target nucleic acids are NOT DETECTED.  The SARS-CoV-2 RNA is generally detectable in upper respiratory specimens during the acute phase of infection. The lowest concentration of SARS-CoV-2 viral copies this assay can detect is 138 copies/mL.  A negative result does not preclude SARS-Cov-2 infection and should not be used as the sole basis for treatment or other patient management decisions. A negative result may occur with  improper specimen collection/handling, submission of specimen other than nasopharyngeal swab, presence of viral mutation(s) within the areas targeted by this assay, and inadequate number of viral copies(<138 copies/mL). A negative result must be combined with clinical observations, patient history, and epidemiological information. The expected result is Negative.  Fact Sheet for Patients:  EntrepreneurPulse.com.au  Fact Sheet for Healthcare Providers:  IncredibleEmployment.be  This test is no t yet approved or cleared by the [place] FDA and  has been authorized for detection and/or diagnosis of SARS-CoV-2 by FDA under an Emergency Use Authorization (EUA). This EUA will remain  in effect (meaning this test can be used) for the duration of the COVID-19 declaration under Section 564(b)(1) of the Act, 21 U.S.C.section 360bbb-3(b)(1), unless the authorization is terminated  or revoked sooner.       Influenza A by PCR NEGATIVE NEGATIVE Final   Influenza B by PCR NEGATIVE NEGATIVE Final    Comment: (NOTE) The Xpert Xpress SARS-CoV-2/FLU/RSV plus assay is intended as an aid in the diagnosis of influenza from Nasopharyngeal swab specimens and should not be used as a sole basis for treatment. Nasal washings and aspirates are unacceptable for Xpert Xpress SARS-CoV-2/FLU/RSV testing.  Fact Sheet for Patients: EntrepreneurPulse.com.au  Fact Sheet for Healthcare Providers: IncredibleEmployment.be  This test is not yet approved or cleared by the [place] FDA and has been authorized for detection and/or diagnosis of SARS-CoV-2 by FDA under an Emergency Use Authorization (EUA). This EUA will remain in effect (meaning this test  can be used) for the duration of the COVID-19 declaration under Section 564(b)(1) of the Act, 21 U.S.C. section 360bbb-3(b)(1), unless the authorization is terminated or revoked.  Performed at [facility], [address] [place], [place] [address]   Body fluid culture w Gram Stain     Status: None   Collection Time: 07/28/21  1:53 PM   Specimen: PATH Cytology Pleural fluid  Result Value Ref Range Status   Specimen Description   Final    PLEURAL Performed at [facility], [address] [place], [place] [address]    Special Requests   Final    NONE Performed at [facility], [address] [place], [place] [address]    Gram Stain   Final    NO SQUAMOUS EPITHELIAL CELLS SEEN FEW WBC SEEN NO ORGANISMS SEEN    Culture   Final    NO GROWTH 3 DAYS Performed at [facility], [place] [address], [place], [place] [address]    Report Status 08/01/2021 FINAL  Final  Body fluid culture w Gram Stain     Status: None   Collection Time: 08/01/21  3:36 PM   Specimen: PATH Cytology Peritoneal fluid  Result Value Ref Range Status   Specimen Description   Final    PERITONEAL Performed at [facility], [address] [place], [place] [address]    Special Requests   Final    NONE Performed at [facility], [place], [place] [address]    Gram Stain   Final    RARE WBC PRESENT,BOTH PMN AND MONONUCLEAR NO ORGANISMS SEEN    Culture   Final    NO GROWTH 3 DAYS Performed at [facility], [place] [address], [place], [place] [address]    Report Status 08/05/2021 FINAL  Final  MRSA Next Gen by PCR, Nasal     Status: None   Collection Time: 08/04/21  8:56 PM   Specimen: Nasal Mucosa; Nasal Swab  Result Value Ref Range Status   MRSA by PCR Next Gen NOT DETECTED NOT DETECTED Final    Comment: (NOTE) The GeneXpert MRSA Assay (FDA approved for NASAL specimens only), is one component of a comprehensive MRSA colonization  surveillance program. It is not intended to diagnose MRSA infection nor to guide or monitor treatment for MRSA infections. Test performance is not FDA approved in patients less than 78 years old. Performed at [facility], [address] [place], [place] [address]   Body fluid culture w Gram Stain     Status: None   Collection Time: 08/05/21  5:50 PM   Specimen: Pericardial  Result Value Ref Range Status   Specimen Description   Final    PERICARDIAL Performed at [facility], [address], [place], [place] [address]    Special Requests   Final    NONE Performed at [facility], [address] [place], [place] [address]    Gram Stain   Final    FEW WBC PRESENT, PREDOMINANTLY MONONUCLEAR NO ORGANISMS SEEN    Culture   Final    NO GROWTH 3 DAYS Performed at [facility], [place] [address] [place], [place] [address]    Report Status 08/09/2021 FINAL  Final    Coagulation Studies: No results for input(s): LABPROT, INR in the last 72 hours.   Urinalysis: No results for input(s): COLORURINE, LABSPEC, PHURINE, GLUCOSEU, HGBUR, BILIRUBINUR, KETONESUR, PROTEINUR, UROBILINOGEN, NITRITE, LEUKOCYTESUR in the last 72 hours.  Invalid input(s): APPERANCEUR    Imaging: ECHOCARDIOGRAM LIMITED  Result Date: 08/08/2021    ECHOCARDIOGRAM LIMITED REPORT   Patient Name:   Karen Farley Date of Exam: 08/08/2021 Medical Rec #:  [id number]          Height:       62.0 in Accession #:    [id number]         Weight:       224.9 lb Date of Birth:  [birth year]           BSA:          2.009 m Patient Age:    16 years           BP:           101/66 mmHg Patient Gender: F                  HR:           85 bpm. Exam Location:  [facility] Procedure: Limited Echo, Limited Color Doppler and Cardiac Doppler Indications:     I31.3 Pericardial effusion  History:         Patient has prior history of  Echocardiogram examinations, most                  recent 08/07/2021. HFpEF, CKD;  Risk Factors:Hypertension,                  Diabetes and Dyslipidemia.  Sonographer:     Charmayne Sheer Referring Phys:  [phone] CHRISTOPHER END Diagnosing Phys: Kate Sable MD  Sonographer Comments: Suboptimal apical window and suboptimal subcostal window. Image acquisition challenging due to patient body habitus. IMPRESSIONS  1. Left ventricular ejection fraction, by estimation, is 60 to 65%. The left ventricle has normal function.  2. Right ventricular systolic function is normal. There is severely elevated pulmonary artery systolic pressure. The estimated right ventricular systolic pressure is 26.7 mmHg.  3. Large pleural effusion in the left lateral region.  4. The inferior vena cava is dilated in size with <50% respiratory variability, suggesting right atrial pressure of 15 mmHg. FINDINGS  Left Ventricle: Left ventricular ejection fraction, by estimation, is 60 to 65%. The left ventricle has normal function. Right Ventricle: No increase in right ventricular wall thickness. Right ventricular systolic function is normal. There is severely elevated pulmonary artery systolic pressure. The tricuspid regurgitant velocity is 3.52 m/s, and with an assumed right atrial pressure of 15 mmHg, the estimated right ventricular systolic pressure is 12.4 mmHg. Pericardium: Trivial pericardial effusion is present. Venous: The inferior vena cava is dilated in size with less than 50% respiratory variability, suggesting right atrial pressure of 15 mmHg. Additional Comments: There is a large pleural effusion in the left lateral region. LEFT VENTRICLE PLAX 2D LVIDd:         4.10 cm LVIDs:         3.00 cm LV PW:         1.20 cm LV IVS:        0.80 cm  LEFT ATRIUM         Index LA diam:    5.20 cm 2.59 cm/m  TRICUSPID VALVE TR Peak grad:   49.6 mmHg TR Vmax:        352.00 cm/s Kate Sable MD Electronically signed by Kate Sable MD Signature Date/Time: 08/08/2021/4:37:39 PM    Final      Medications:    sodium chloride  Stopped (08/05/21 1441)   albumin human Stopped (08/09/21 1742)    Chlorhexidine Gluconate Cloth  6 each Topical Daily   folic acid  1 mg Oral Daily   insulin aspart  0-5 Units Subcutaneous QHS   insulin aspart  0-9 Units Subcutaneous TID WC   iron polysaccharides  150 mg Oral Daily   latanoprost  1 drop Both Eyes QHS   midodrine  10 mg Oral TID WC   pantoprazole (PROTONIX) IV  40 mg Intravenous Q12H   polyethylene glycol  17 g Oral Daily   Ensure Max Protein  11 oz Oral BID   torsemide  20 mg Oral BID   vitamin B-12  500 mcg Oral Daily     Assessment/ Plan:  Karen Farley is a 75 y.o. white female with diastolic congestive heart failure, hypertension, hyperlipidemia, lymphedema, atrial fibrillation, peripheral vascular disease who is admitted to [facility] on 07/26/2021 for Morbid obesity ([place]) [E66.01] DOE (dyspnea on exertion) [R06.09] Recurrent left pleural effusion [J90] Hypotension [I95.9] Type 2 diabetes mellitus without complication, without long-term current use of insulin ([place]) [E11.9] Atrial fibrillation, unspecified type ([place]) [I48.91] Chronic congestive heart failure, unspecified heart failure type ([place]) [I50.9]  Acute kidney injury on  chronic kidney disease stage IIIB: with baseline creatinine of 1.66, GFR of 36 on 07/13/21. History of bland urine. 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 Hold benazepril and spironolactone.  Has received furosemide during this admission. Creatinine remains at baseline - Will order Furosemide 40mg  IV once -Continue albumin twice daily  Hypotension: BP 112/91 no pressors  Acute exacerbation of diastolic congestive heart failure:  - Cardiology following, Echo on 08/07/21 shows EF 55% -Fluid restriction -Ordered small dose of IV Furosemide today  Hyponatremia: with hypervolumia.   -Resolved with fluid restriction and previous diuresis  5. Left pleural effusion: Thoracentesis on 07/28/21 with 647ml removed.  Paracentesis on 08/01/21 with 1.2L removed. Large pleural effusion seen on echo.  Pulmonology following. No intervention scheduled at this time  6. Pericardial effusion- Pericardiocentesis performed on 08/05/21 with 1.1L removed.  Repeat echo on 08/07/21 shows a small to moderate pericardial effusion.  Recorded pericardial Hemovac output at 250 mL.  Pericardial drain removed and monitoring with ECHO.    LOS: 15 Najwa Spillane 10/21/20229:03 AM

## 2021-08-10 NOTE — Progress Notes (Signed)
[facility]  ROUNDING NOTE   Subjective:   [name] was admitted to [facility] on 07/26/2021 for Morbid obesity ([place]) [E66.01] DOE (dyspnea on exertion) [R06.09] Recurrent left pleural effusion [J90] Hypotension [I95.9] Type 2 diabetes mellitus without complication, without long-term current use of insulin ([place]) [E11.9] Atrial fibrillation, unspecified type ([place]) [I48.91] Chronic congestive heart failure, unspecified heart failure type [facility]) [I50.9]  Patient was last seen by my partner, Dr. Candiss Norse, on 05/31/21. Where torsemide was restarted and to continue metolazone.   Patient admitted with shortness of breath and increasing peripheral edema. She was then started on IV furosemide. Left thoracentesis done on 07/28/21  650mL of pleural fluid.   Patient seen and evaluated at bedside Husband at bedside assisting with breakfast Denies shortness of breath Tolerating meals  Objective:  Vital signs in last 24 hours:  Temp:  [98 F (36.7 C)-98.6 F (37 C)] 98.5 F (36.9 C) (10/21 1200) Pulse Rate:  [89-109] 96 (10/21 0600) Resp:  [17-30] 19 (10/21 1200) BP: (90-120)/(57-91) 112/91 (10/21 1200) SpO2:  [91 %-96 %] 94 % (10/21 1200)  Weight change:  Filed Weights   08/04/21 2020 08/05/21 0413 08/06/21 0253  Weight: 101.7 kg 101.3 kg 102 kg    Intake/Output: I/O last 3 completed shifts: In: 936.6 [P.O.:840; IV Piggyback:96.6] Out: 6045 [Urine:3275]   Intake/Output this shift:  Total I/O In: 382.2 [P.O.:340; IV Piggyback:42.2] Out: 650 [Urine:650]  Physical Exam: General: NAD, resting in bed  Head: Normocephalic, atraumatic. Moist oral mucosal membranes  Eyes: Anicteric  Lungs:  Clear bilaterally, normal effort  Heart: Irregular rhythm  Abdomen:  Soft, nontender,   Extremities:  + BUE peripheral edema.  Neurologic: Nonfocal, moving all four extremities  Skin: Minimal blistering        Basic Metabolic Panel: Recent Labs  Lab 08/05/21 0444  08/06/21 0510 08/07/21 0552 08/08/21 0445 08/09/21 0532 08/10/21 0809  NA 140 143 144 141 141 144  K 4.0 3.4* 3.4* 3.6 3.3* 3.3*  CL 100 104 104 102 101 101  CO2 30 28 33* 30 34* 32  GLUCOSE 244* 148* 147* 158* 146* 151*  BUN 87* 67* 51* 37* 35* 32*  CREATININE 1.55* 1.43* 1.30* 1.33* 1.39* 1.47*  CALCIUM 8.9 9.3 9.2 8.9 8.9 8.7*  MG 2.1 2.2 2.1  --  2.0 1.9  PHOS 3.7 3.5 3.1  --  2.3* 2.0*     Liver Function Tests: Recent Labs  Lab 08/06/21 0510  AST 21  ALT 15  ALKPHOS 40  BILITOT 2.6*  PROT 5.3*  ALBUMIN 3.6    No results for input(s): LIPASE, AMYLASE in the last 168 hours. No results for input(s): AMMONIA in the last 168 hours.  CBC: Recent Labs  Lab 08/06/21 0510 08/06/21 2159 08/07/21 0552 08/08/21 0445 08/09/21 0532 08/10/21 0809  WBC 14.6*  --  13.4* 10.5 8.2 8.1  NEUTROABS 10.8*  --   --   --  6.2 6.2  HGB 10.3* 9.3* 9.4* 9.3* 9.7* 9.7*  HCT 31.9* 29.5* 29.1* 30.0* 30.9* 30.4*  MCV 86.2  --  86.6 87.7 88.3 89.7  PLT 227  --  205 192 186 196     Cardiac Enzymes: No results for input(s): CKTOTAL, CKMB, CKMBINDEX, TROPONINI in the last 168 hours.  BNP: Invalid input(s): POCBNP  CBG: Recent Labs  Lab 08/09/21 1126 08/09/21 1633 08/09/21 2150 08/10/21 0823 08/10/21 1137  GLUCAP 177* 189* 180* 143* 209*     Microbiology: Results for orders placed or performed  during the hospital encounter of 07/26/21  Resp Panel by RT-PCR (Flu A&B, Covid) Nasopharyngeal Swab     Status: None   Collection Time: 07/26/21  7:19 PM   Specimen: Nasopharyngeal Swab; Nasopharyngeal(NP) swabs in vial transport medium  Result Value Ref Range Status   SARS Coronavirus 2 by RT PCR NEGATIVE NEGATIVE Final    Comment: (NOTE) SARS-CoV-2 target nucleic acids are NOT DETECTED.  The SARS-CoV-2 RNA is generally detectable in upper respiratory specimens during the acute phase of infection. The lowest concentration of SARS-CoV-2 viral copies this assay can detect  is 138 copies/mL. A negative result does not preclude SARS-Cov-2 infection and should not be used as the sole basis for treatment or other patient management decisions. A negative result may occur with  improper specimen collection/handling, submission of specimen other than nasopharyngeal swab, presence of viral mutation(s) within the areas targeted by this assay, and inadequate number of viral copies(<138 copies/mL). A negative result must be combined with clinical observations, patient history, and epidemiological information. The expected result is Negative.  Fact Sheet for Patients:  EntrepreneurPulse.com.au  Fact Sheet for Healthcare Providers:  IncredibleEmployment.be  This test is no t yet approved or cleared by the [place] FDA and  has been authorized for detection and/or diagnosis of SARS-CoV-2 by FDA under an Emergency Use Authorization (EUA). This EUA will remain  in effect (meaning this test can be used) for the duration of the COVID-19 declaration under Section 564(b)(1) of the Act, 21 U.S.C.section 360bbb-3(b)(1), unless the authorization is terminated  or revoked sooner.       Influenza A by PCR NEGATIVE NEGATIVE Final   Influenza B by PCR NEGATIVE NEGATIVE Final    Comment: (NOTE) The Xpert Xpress SARS-CoV-2/FLU/RSV plus assay is intended as an aid in the diagnosis of influenza from Nasopharyngeal swab specimens and should not be used as a sole basis for treatment. Nasal washings and aspirates are unacceptable for Xpert Xpress SARS-CoV-2/FLU/RSV testing.  Fact Sheet for Patients: EntrepreneurPulse.com.au  Fact Sheet for Healthcare Providers: IncredibleEmployment.be  This test is not yet approved or cleared by the [place] FDA and has been authorized for detection and/or diagnosis of SARS-CoV-2 by FDA under an Emergency Use Authorization (EUA). This EUA will remain in effect  (meaning this test can be used) for the duration of the COVID-19 declaration under Section 564(b)(1) of the Act, 21 U.S.C. section 360bbb-3(b)(1), unless the authorization is terminated or revoked.  Performed at [facility], [address] [place], [place] [address]   Body fluid culture w Gram Stain     Status: None   Collection Time: 07/28/21  1:53 PM   Specimen: PATH Cytology Pleural fluid  Result Value Ref Range Status   Specimen Description   Final    PLEURAL Performed at [facility], [address], [place], [place] [address]    Special Requests   Final    NONE Performed at [facility], [address] [place], [place] [address]    Gram Stain   Final    NO SQUAMOUS EPITHELIAL CELLS SEEN FEW WBC SEEN NO ORGANISMS SEEN    Culture   Final    NO GROWTH 3 DAYS Performed at [facility], [place] [address] [place], [place] [address]    Report Status 08/01/2021 FINAL  Final  Body fluid culture w Gram Stain     Status: None   Collection Time: 08/01/21  3:36 PM   Specimen: PATH Cytology Peritoneal fluid  Result Value Ref Range Status   Specimen Description   Final    PERITONEAL Performed at [facility], [address] [place], [place] [address]    Special Requests   Final    NONE Performed at [facility], [place], [place] [address]    Gram Stain   Final    RARE WBC PRESENT,BOTH PMN AND MONONUCLEAR NO ORGANISMS SEEN    Culture   Final    NO GROWTH 3 DAYS Performed at [facility], [place] [address] [place], [place] [address]    Report Status 08/05/2021 FINAL  Final  MRSA Next Gen by PCR, Nasal     Status: None   Collection Time: 08/04/21  8:56 PM   Specimen: Nasal Mucosa; Nasal Swab  Result Value Ref Range Status   MRSA by PCR Next Gen NOT DETECTED NOT DETECTED Final    Comment: (NOTE) The GeneXpert MRSA Assay (FDA approved for NASAL specimens only), is one component of a comprehensive  MRSA colonization surveillance program. It is not intended to diagnose MRSA infection nor to guide or monitor treatment for MRSA infections. Test performance is not FDA approved in patients less than 15 years old. Performed at [facility], [address] [place], [place] [address]   Body fluid culture w Gram Stain     Status: None   Collection Time: 08/05/21  5:50 PM   Specimen: Pericardial  Result Value Ref Range Status   Specimen Description   Final    PERICARDIAL Performed at [facility], [address] [place], [place] [address]    Special Requests   Final    NONE Performed at [facility], [address] [place], [place] [address]    Gram Stain   Final    FEW WBC PRESENT, PREDOMINANTLY MONONUCLEAR NO ORGANISMS SEEN    Culture   Final    NO GROWTH 3 DAYS Performed at [facility], [place] [address] [place],  [address]    Report Status 08/09/2021 FINAL  Final    Coagulation Studies: No results for input(s): LABPROT, INR in the last 72 hours.   Urinalysis: No results for input(s): COLORURINE, LABSPEC, PHURINE, GLUCOSEU, HGBUR, BILIRUBINUR, KETONESUR, PROTEINUR, UROBILINOGEN, NITRITE, LEUKOCYTESUR in the last 72 hours.  Invalid input(s): APPERANCEUR    Imaging: ECHOCARDIOGRAM LIMITED  Result Date: 08/10/2021    ECHOCARDIOGRAM LIMITED REPORT   Patient Name:   Karen Farley Date of Exam: 08/10/2021 Medical Rec #:  [id number]          Height:       62.0 in Accession #:    [id number]         Weight:       224.9 lb Date of Birth:  Aug 09, 1946           BSA:          2.009 m Patient Age:    4 years           BP:           105/70 mmHg Patient Gender: F                  HR:           110 bpm. Exam Location:  [facility] Procedure: 2D Echo, Limited Color Doppler and Cardiac Doppler Indications:     I31.3 Pericardial effusion  History:         Patient has prior history of  Echocardiogram examinations, most                  recent 08/08/2021.  HFpEF, CKD; Risk Factors:Diabetes,                  Hypertension and Dyslipidemia.  Sonographer:     Charmayne Sheer Referring Phys:  [id number] CHRISTOPHER END Diagnosing Phys: Nelva Bush MD  Sonographer Comments: Technically difficult study due to poor echo windows. Image acquisition challenging due to patient body habitus. IMPRESSIONS  1. Left ventricular ejection fraction, by estimation, is 60 to 65%. The left ventricle has normal function. There is mild left ventricular hypertrophy.  2. Right ventricular systolic function is normal. The right ventricular size is normal. There is severely elevated pulmonary artery systolic pressure.  3. A small pericardial effusion is present. The pericardial effusion is posterior to the left ventricle and localized near the right atrium.  4. The aortic valve has an indeterminant number of cusps. There is moderate calcification of the aortic valve. There is moderate thickening of the aortic valve.  5. The inferior vena cava is dilated in size with <50% respiratory variability, suggesting right atrial pressure of 15 mmHg. FINDINGS  Left Ventricle: Left ventricular ejection fraction, by estimation, is 60 to 65%. The left ventricle has normal function. There is mild left ventricular hypertrophy. Right Ventricle: The right ventricular size is normal. No increase in right ventricular wall thickness. Right ventricular systolic function is normal. There is severely elevated pulmonary artery systolic pressure. The tricuspid regurgitant velocity is 3.42 m/s, and with an assumed right atrial pressure of 15 mmHg, the estimated right ventricular systolic pressure is 84.1 mmHg. Pericardium: A small pericardial effusion is present. The pericardial effusion is posterior to the left ventricle and localized near the right atrium. Presence of pericardial fat pad. Mitral Valve: There is mild thickening of the mitral valve leaflet(s). Mild mitral annular calcification. MV peak gradient, 15.1 mmHg. The  mean mitral valve gradient is 7.0 mmHg. Tricuspid Valve: The tricuspid valve is not well visualized. Aortic Valve: The aortic valve has an indeterminant number of cusps. There is moderate calcification of the aortic valve. There is moderate thickening of the aortic valve. Venous: The inferior vena cava is dilated in size with less than 50% respiratory variability, suggesting right atrial pressure of 15 mmHg. LEFT VENTRICLE PLAX 2D LVIDd:         4.30 cm Diastology LVIDs:         2.80 cm LV e' medial:    7.29 cm/s LV PW:         0.90 cm LV E/e' medial:  22.3 LV IVS:        0.90 cm LV e' lateral:   9.36 cm/s                        LV E/e' lateral: 17.4  LEFT ATRIUM         Index LA diam:    4.60 cm 2.29 cm/m  MITRAL VALVE                TRICUSPID VALVE MV Area (PHT): 4.30 cm     TR Peak grad:   46.8 mmHg MV Peak grad:  15.1 mmHg    TR Vmax:        342.00 cm/s MV Mean grad:  7.0 mmHg MV Vmax:       1.94 m/s MV Vmean:      121.0 cm/s MV Decel Time:  177 msec MV E velocity: 162.50 cm/s Nelva Bush MD Electronically signed by Nelva Bush MD Signature Date/Time: 08/10/2021/11:59:21 AM    Final      Medications:    sodium chloride Stopped (08/05/21 1441)   albumin human Stopped (08/10/21 1020)    Chlorhexidine Gluconate Cloth  6 each Topical Daily   folic acid  1 mg Oral Daily   furosemide  40 mg Intravenous BID   insulin aspart  0-5 Units Subcutaneous QHS   insulin aspart  0-9 Units Subcutaneous TID WC   iron polysaccharides  150 mg Oral Daily   latanoprost  1 drop Both Eyes QHS   metoprolol tartrate  12.5 mg Oral Q6H   midodrine  10 mg Oral TID WC   pantoprazole (PROTONIX) IV  40 mg Intravenous Q12H   polyethylene glycol  17 g Oral Daily   potassium chloride  40 mEq Oral Once   Ensure Max Protein  11 oz Oral BID   vitamin B-12  500 mcg Oral Daily     Assessment/ Plan:  [name] is a 75 y.o. white female with diastolic congestive heart failure, hypertension, hyperlipidemia,  lymphedema, atrial fibrillation, peripheral vascular disease who is admitted to [facility] on 07/26/2021 for Morbid obesity ([place]) [E66.01] DOE (dyspnea on exertion) [R06.09] Recurrent left pleural effusion [J90] Hypotension [I95.9] Type 2 diabetes mellitus without complication, without long-term current use of insulin ([place]) [E11.9] Atrial fibrillation, unspecified type ([place]) [I48.91] Chronic congestive heart failure, unspecified heart failure type ([place]) [I50.9]  Acute kidney injury on chronic kidney disease stage IIIB: with baseline creatinine of 1.66, GFR of 36 on 07/13/21. History of bland urine. 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 Hold benazepril and spironolactone.  Has received furosemide during this admission.  - Due to stable fluid status, continue to hold diuretics -Continue albumin twice daily  Hypotension: BP 112/62, phenylephrine discontinued yesterday  Acute exacerbation of diastolic congestive heart failure:  - Cardiology following, Echo on 08/07/21 shows EF 55% -Fluid restriction -Will defer diuretic restart to cardiology  Hyponatremia: with hypervolumia.   -Resolved with fluid restriction and previous diuresis  5. Left pleural effusion: Thoracentesis on 07/28/21 with 619ml removed. Paracentesis on 08/01/21 with 1.2L removed. Large pleural effusion seen on echo.  Pulmonology following   6. Pericardial effusion- Pericardiocentesis performed on 08/05/21 with 1.1L removed.  Repeat echo on 08/07/21 shows a small to moderate pericardial effusion.  Recorded pericardial Hemovac output at 250 mL.  Cardiology removed drain today and will monitor for fluid accumulation with daily ECHOs. .   LOS: [place] 10/21/20221:01 PM

## 2021-08-10 NOTE — Progress Notes (Signed)
Physical Therapy Treatment Patient Details Name: Karen Farley MRN: [id number] DOB: 1946-07-02 Today's Date: 08/10/2021   History of Present Illness 75 y/o female history HFpEF, Afib on Eliquis, PAD, DM, CKDIII, lymphedema, HTN admitted to hospital 10/6 with sob, orthopnea, weight gain with hypotension, large left pleural effusion, generalized anasarca on admission. S/p left thoracentesis 10/8 626ml removed and paracentesis 10/12 1.2L removed. Failed Lasix IV advanced to Lasix gtt; however, Lasix and Eliquis stopped 10/15 and patient transferred to ICU after had episode of GI bleed with drop in hgb, s/p 2U pRBC and vasopressor support initiated. Echo done 10/16 showed large pericardial effusion s/p pericardiocentesis 77mL fluid removed with drain placed.    PT Comments    Pt received in recliner, reports fatigue from earlier toileting however agreeable to seated therex. Pt performed 2x10 of each LE exercise. PT providing VC on technique and education on muscle activation. Pt did demo fatigue during the 2nd set of marching, LAQ and heel slides, achieving decreased AROM. Pt vitals remained stable, max HR at 111. She did present with edema in B legs and feet. She reports her legs felt heavy during therex - pt educated on increased fluid = increased weight. Pt would benefit from continued skilled PT interventions to work towards independence with functional activity and ambulation.   Recommendations for follow up therapy are one component of a multi-disciplinary discharge planning process, led by the attending physician.  Recommendations may be updated based on patient status, additional functional criteria and insurance authorization.  Follow Up Recommendations  Home health PT;Supervision for mobility/OOB (pt indicates that she does not think she will need HHPT by d/c)     Equipment Recommendations  Rolling walker with 5" wheels    Recommendations for Other Services       Precautions /  Restrictions Precautions Precautions: Fall Restrictions Weight Bearing Restrictions: No     Mobility  Bed Mobility               General bed mobility comments: not assessed - pt in chair at beginning of session, BSC at end of session (RN notified)    Transfers Overall transfer level: Needs assistance Equipment used: Rolling walker (2 wheeled) Transfers: Sit to/from [facility] Sit to Stand: Min guard Stand pivot transfers: Min assist       General transfer comment: CGA to steady upon standing, pt reaching out of BOS for [facility] handle. Pt did stumble during stand pivot requiring MIN A to prevent posterior LOB  Ambulation/Gait             General Gait Details: deferred - stand pivot to [facility]. Session ended with pt on BSC.   Stairs             Wheelchair Mobility    Modified Rankin (Stroke Patients Only)       Balance Overall balance assessment: Needs assistance   Sitting balance-Leahy Scale: Good     Standing balance support: During functional activity Standing balance-Leahy Scale: Poor Standing balance comment: no AD used for stand pivot to [facility] with pt stumbling and posteior LOB, PT correcting                            Cognition Arousal/Alertness: Awake/alert Behavior During Therapy: WFL for tasks assessed/performed Overall Cognitive Status: Within Functional Limits for tasks assessed  Exercises General Exercises - Lower Extremity Ankle Circles/Pumps: AROM;Strengthening;20 reps;Both;Seated Gluteal Sets: Strengthening;Both;20 reps;Seated Long Arc Quad: AROM;Strengthening;Both;20 reps;Seated Heel Slides: AROM;Strengthening;Both;20 reps;Seated Hip Flexion/Marching: AROM;Strengthening;Both;20 reps;Seated    General Comments        Pertinent Vitals/Pain Pain Assessment: No/denies pain    Home Living                      Prior Function             PT Goals (current goals can now be found in the care plan section) Acute Rehab PT Goals Patient Stated Goal: to regain independence PT Goal Formulation: With patient Time For Goal Achievement: 08/29/21 Potential to Achieve Goals: Good    Frequency    Min 2X/week      PT Plan      Co-evaluation              AM-PAC PT "6 Clicks" Mobility   Outcome Measure  Help needed turning from your back to your side while in a flat bed without using bedrails?: A Little Help needed moving from lying on your back to sitting on the side of a flat bed without using bedrails?: A Little Help needed moving to and from a bed to a chair (including a wheelchair)?: A Little Help needed standing up from a chair using your arms (e.g., wheelchair or bedside chair)?: A Little Help needed to walk in hospital room?: A Little Help needed climbing 3-5 steps with a railing? : A Lot 6 Click Score: 17    End of Session Equipment Utilized During Treatment: Gait belt Activity Tolerance: Patient tolerated treatment well;Patient limited by fatigue Patient left: with call bell/phone within reach;Other (comment);with family/visitor present (on [facility] - RN aware. Husband in and out of room to check on pt.) Nurse Communication: Mobility status (needs new PureWick) PT Visit Diagnosis: Muscle weakness (generalized) (M62.81);Difficulty in walking, not elsewhere classified (R26.2);Unsteadiness on feet (R26.81)     Time: 8333-8329 PT Time Calculation (min) (ACUTE ONLY): 16 min  Charges:  $Therapeutic Exercise: 8-22 mins $Therapeutic Activity: 8-22 mins                     Patrina Levering PT, DPT 08/10/21 5:17 PM [phone]

## 2021-08-10 NOTE — Progress Notes (Signed)
*  PRELIMINARY RESULTS* Echocardiogram 2D Echocardiogram has been performed.  Karen Farley 08/10/2021, 9:37 AM

## 2021-08-11 DIAGNOSIS — I4821 Permanent atrial fibrillation: Secondary | ICD-10-CM | POA: Diagnosis not present

## 2021-08-11 DIAGNOSIS — D62 Acute posthemorrhagic anemia: Secondary | ICD-10-CM | POA: Diagnosis not present

## 2021-08-11 DIAGNOSIS — J9601 Acute respiratory failure with hypoxia: Secondary | ICD-10-CM | POA: Diagnosis not present

## 2021-08-11 DIAGNOSIS — I509 Heart failure, unspecified: Secondary | ICD-10-CM

## 2021-08-11 DIAGNOSIS — I5033 Acute on chronic diastolic (congestive) heart failure: Secondary | ICD-10-CM | POA: Diagnosis not present

## 2021-08-11 LAB — GLUCOSE, CAPILLARY
Glucose-Capillary: 135 mg/dL — ABNORMAL HIGH (ref 70–99)
Glucose-Capillary: 178 mg/dL — ABNORMAL HIGH (ref 70–99)
Glucose-Capillary: 159 mg/dL — ABNORMAL HIGH (ref 70–99)
Glucose-Capillary: 150 mg/dL — ABNORMAL HIGH (ref 70–99)

## 2021-08-11 LAB — CBC WITH DIFFERENTIAL/PLATELET
Abs Immature Granulocytes: 0.1 10*3/uL — ABNORMAL HIGH (ref 0.00–0.07)
Basophils Absolute: 0.1 10*3/uL (ref 0.0–0.1)
Basophils Relative: 1 %
Eosinophils Absolute: 0.4 10*3/uL (ref 0.0–0.5)
Eosinophils Relative: 4 %
HCT: 31.3 % — ABNORMAL LOW (ref 36.0–46.0)
Hemoglobin: 9.9 g/dL — ABNORMAL LOW (ref 12.0–15.0)
Immature Granulocytes: 1 %
Lymphocytes Relative: 10 %
Lymphs Abs: 0.9 10*3/uL (ref 0.7–4.0)
MCH: 28.9 pg (ref 26.0–34.0)
MCHC: 31.6 g/dL (ref 30.0–36.0)
MCV: 91.3 fL (ref 80.0–100.0)
Monocytes Absolute: 0.8 10*3/uL (ref 0.1–1.0)
Monocytes Relative: 9 %
Neutro Abs: 6.8 10*3/uL (ref 1.7–7.7)
Neutrophils Relative %: 75 %
Platelets: 214 10*3/uL (ref 150–400)
RBC: 3.43 MIL/uL — ABNORMAL LOW (ref 3.87–5.11)
RDW: 26.1 % — ABNORMAL HIGH (ref 11.5–15.5)
Smear Review: NORMAL
WBC: 9 10*3/uL (ref 4.0–10.5)
nRBC: 0.3 % — ABNORMAL HIGH (ref 0.0–0.2)

## 2021-08-11 LAB — BASIC METABOLIC PANEL
Anion gap: 7 (ref 5–15)
BUN: 34 mg/dL — ABNORMAL HIGH (ref 8–23)
CO2: 35 mmol/L — ABNORMAL HIGH (ref 22–32)
Calcium: 8.8 mg/dL — ABNORMAL LOW (ref 8.9–10.3)
Chloride: 100 mmol/L (ref 98–111)
Creatinine, Ser: 1.34 mg/dL — ABNORMAL HIGH (ref 0.44–1.00)
GFR, Estimated: 41 mL/min — ABNORMAL LOW (ref >60)
Glucose, Bld: 155 mg/dL — ABNORMAL HIGH (ref 70–99)
Potassium: 3.3 mmol/L — ABNORMAL LOW (ref 3.5–5.1)
Sodium: 142 mmol/L (ref 135–145)

## 2021-08-11 LAB — MAGNESIUM: Magnesium: 2 mg/dL (ref 1.7–2.4)

## 2021-08-11 LAB — HEPARIN LEVEL (UNFRACTIONATED): Heparin Unfractionated: 0.42 IU/mL (ref 0.30–0.70)

## 2021-08-11 LAB — PHOSPHORUS: Phosphorus: 2.2 mg/dL — ABNORMAL LOW (ref 2.5–4.6)

## 2021-08-11 MED ORDER — DOCUSATE SODIUM 100 MG PO CAPS
100.0000 mg | ORAL_CAPSULE | Freq: Every day | ORAL | Status: DC
  Administered 2021-08-12 – 2021-08-28 (×16): 100 mg via ORAL
  Filled 2021-08-11 (×16): qty 1

## 2021-08-11 MED ORDER — FUROSEMIDE 10 MG/ML IJ SOLN
40.0000 mg | Freq: Three times a day (TID) | INTRAMUSCULAR | Status: DC
  Administered 2021-08-11 – 2021-08-12 (×3): 40 mg via INTRAVENOUS
  Filled 2021-08-11 (×4): qty 4

## 2021-08-11 NOTE — Progress Notes (Signed)
[place] for heparin Indication: atrial fibrillation  No Known Allergies  Patient Measurements: Height: 5\' 2"  (157.5 cm) Weight: 102.1 kg (225 lb 1.4 oz) IBW/kg (Calculated) : 50.1 Heparin Dosing Weight: 74 kg  Vital Signs: Temp: 97.9 F (36.6 C) (10/22 0400) Temp Source: Oral (10/22 0400) BP: 89/72 (10/22 0600) Pulse Rate: 76 (10/22 0600)  Labs: Recent Labs    08/09/21 0532 08/10/21 0809 08/10/21 1412 08/10/21 1546 08/10/21 2149 08/11/21 0620  HGB 9.7* 9.7*  --   --   --  9.9*  HCT 30.9* 30.4*  --   --   --  31.3*  PLT 186 196  --   --   --  214  APTT  --   --  200* 83* 93*  --   LABPROT  --   --  17.5*  --   --   --   INR  --   --  1.4*  --   --   --   HEPARINUNFRC  --   --   --   --  0.37 0.42  CREATININE 1.39* 1.47*  --   --   --  1.34*     Estimated Creatinine Clearance: 40.6 mL/min (A) (by C-G formula based on SCr of 1.34 mg/dL (H)).   Medical History: Past Medical History:  Diagnosis Date   Chronic heart failure with preserved ejection fraction (HFpEF) ([place])    a. 11/2019 Echo: EF 60-65%, no rwma, Nl RV size/fxn. Mod dil LA.   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 Patient with pericardial effusion requiring drain placement. Suspect pericarditis complicated by hemorrhage in the setting of anticoagulation. Eliquis has been on hold. Drain has now been removed with repeat echo showing small effusion with no need for repeat tap or pericardial window. Cardiology has consulted for heparin with plan to repeat echo in 24-48 hours to  ensure effusion is not enlarging.  Date Time aPTT/HL Rate/Comment 1015 0800 --- / ---  Last reported dose of Eliquis --- 10/21 2149 93s / 0.37 Therapeutic x1; 1050 un/hr (correlating)  10/22 0620 0.42  Therax2, cont 1050 u/hr    Baseline Labs: aPTT - unk INR - 1.4 Hgb - 9.7 >  Plts - 196 >  Goal of Therapy:  Heparin level 0.3-0.7 units/ml aPTT 66-102 seconds Monitor platelets by anticoagulation protocol: Yes   Plan:  Heparin level therapeutic x2 at 0.42 Continue current heparin infusion at 1050 units/hr check HL with am labs CTM CBC daily while on heparin.  Noralee Space, PharmD Clinical Pharmacist 08/11/2021 9:34 AM

## 2021-08-11 NOTE — Progress Notes (Signed)
[facility]  PROGRESS NOTE   Subjective:   Patient seen at bedside in the ICU. Family is in attendance. Feels much better today.  Objective:  Vital signs in last 24 hours:  Temp:  [97.5 F (36.4 C)-98.6 F (37 C)] 98.6 F (37 C) (10/22 1200) Pulse Rate:  [76-99] 91 (10/22 1200) Resp:  [15-27] 20 (10/22 1200) BP: (89-118)/(56-75) 106/75 (10/22 1200) SpO2:  [93 %-99 %] 96 % (10/22 1200) Weight:  [102.1 kg] 102.1 kg (10/22 0500)  Weight change:  Filed Weights   08/05/21 0413 08/06/21 0253 08/11/21 0500  Weight: 101.3 kg 102 kg 102.1 kg    Intake/Output: I/O last 3 completed shifts: In: 1080.6 [P.O.:820; I.V.:179; IV Piggyback:81.6] Out: 2675 [Urine:2675]   Intake/Output this shift:  Total I/O In: 829.2 [P.O.:720; I.V.:66.8; IV Piggyback:42.3] Out: 700 [Urine:700]  Physical Exam: General:  No acute distress  Head:  Normocephalic, atraumatic. Moist oral mucosal membranes  Eyes:  Anicteric  Neck:  Supple  Lungs:   Clear to auscultation, normal effort  Heart:  S1S2 no rubs  Abdomen:   Soft, nontender, bowel sounds present  Extremities:  peripheral edema.  Neurologic:  Awake, alert, following commands  Skin:  No lesions  Access:     Basic Metabolic Panel: Recent Labs  Lab 08/06/21 0510 08/07/21 0552 08/08/21 0445 08/09/21 0532 08/10/21 0809 08/11/21 0620  NA 143 144 141 141 144 142  K 3.4* 3.4* 3.6 3.3* 3.3* 3.3*  CL 104 104 102 101 101 100  CO2 28 33* 30 34* 32 35*  GLUCOSE 148* 147* 158* 146* 151* 155*  BUN 67* 51* 37* 35* 32* 34*  CREATININE 1.43* 1.30* 1.33* 1.39* 1.47* 1.34*  CALCIUM 9.3 9.2 8.9 8.9 8.7* 8.8*  MG 2.2 2.1  --  2.0 1.9 2.0  PHOS 3.5 3.1  --  2.3* 2.0* 2.2*    CBC: Recent Labs  Lab 08/06/21 0510 08/06/21 2159 08/07/21 0552 08/08/21 0445 08/09/21 0532 08/10/21 0809 08/11/21 0620  WBC 14.6*  --  13.4* 10.5 8.2 8.1 9.0  NEUTROABS 10.8*  --   --   --  6.2 6.2 6.8  HGB 10.3*   < > 9.4* 9.3* 9.7* 9.7* 9.9*  HCT  31.9*   < > 29.1* 30.0* 30.9* 30.4* 31.3*  MCV 86.2  --  86.6 87.7 88.3 89.7 91.3  PLT 227  --  205 192 186 196 214   < > = values in this interval not displayed.     Urinalysis: No results for input(s): COLORURINE, LABSPEC, PHURINE, GLUCOSEU, HGBUR, BILIRUBINUR, KETONESUR, PROTEINUR, UROBILINOGEN, NITRITE, LEUKOCYTESUR in the last 72 hours.  Invalid input(s): APPERANCEUR    Imaging: ECHOCARDIOGRAM LIMITED  Result Date: 08/10/2021    ECHOCARDIOGRAM LIMITED REPORT   Patient Name:   CHEYNA RETANA Date of Exam: 08/10/2021 Medical Rec #:  [id number]          Height:       62.0 in Accession #:    [id number]         Weight:       224.9 lb Date of Birth:  01-21-1946           BSA:          2.009 m Patient Age:    75 years           BP:           105/70 mmHg Patient Gender: F  HR:           110 bpm. Exam Location:  [facility] Procedure: 2D Echo, Limited Color Doppler and Cardiac Doppler Indications:     I31.3 Pericardial effusion  History:         Patient has prior history of Echocardiogram examinations, most                  recent 08/08/2021. HFpEF, CKD; Risk Factors:Diabetes,                  Hypertension and Dyslipidemia.  Sonographer:     Charmayne Sheer Referring Phys:  [phone] CHRISTOPHER END Diagnosing Phys: Nelva Bush MD  Sonographer Comments: Technically difficult study due to poor echo windows. Image acquisition challenging due to patient body habitus. IMPRESSIONS  1. Left ventricular ejection fraction, by estimation, is 60 to 65%. The left ventricle has normal function. There is mild left ventricular hypertrophy.  2. Right ventricular systolic function is normal. The right ventricular size is normal. There is severely elevated pulmonary artery systolic pressure.  3. A small pericardial effusion is present. The pericardial effusion is posterior to the left ventricle and localized near the right atrium.  4. The aortic valve has an indeterminant number of cusps. There is moderate  calcification of the aortic valve. There is moderate thickening of the aortic valve.  5. The inferior vena cava is dilated in size with <50% respiratory variability, suggesting right atrial pressure of 15 mmHg. FINDINGS  Left Ventricle: Left ventricular ejection fraction, by estimation, is 60 to 65%. The left ventricle has normal function. There is mild left ventricular hypertrophy. Right Ventricle: The right ventricular size is normal. No increase in right ventricular wall thickness. Right ventricular systolic function is normal. There is severely elevated pulmonary artery systolic pressure. The tricuspid regurgitant velocity is 3.42 m/s, and with an assumed right atrial pressure of 15 mmHg, the estimated right ventricular systolic pressure is 89.3 mmHg. Pericardium: A small pericardial effusion is present. The pericardial effusion is posterior to the left ventricle and localized near the right atrium. Presence of pericardial fat pad. Mitral Valve: There is mild thickening of the mitral valve leaflet(s). Mild mitral annular calcification. MV peak gradient, 15.1 mmHg. The mean mitral valve gradient is 7.0 mmHg. Tricuspid Valve: The tricuspid valve is not well visualized. Aortic Valve: The aortic valve has an indeterminant number of cusps. There is moderate calcification of the aortic valve. There is moderate thickening of the aortic valve. Venous: The inferior vena cava is dilated in size with less than 50% respiratory variability, suggesting right atrial pressure of 15 mmHg. LEFT VENTRICLE PLAX 2D LVIDd:         4.30 cm Diastology LVIDs:         2.80 cm LV e' medial:    7.29 cm/s LV PW:         0.90 cm LV E/e' medial:  22.3 LV IVS:        0.90 cm LV e' lateral:   9.36 cm/s                        LV E/e' lateral: 17.4  LEFT ATRIUM         Index LA diam:    4.60 cm 2.29 cm/m  MITRAL VALVE                TRICUSPID VALVE MV Area (PHT): 4.30 cm     TR Peak grad:   46.8 mmHg  MV Peak grad:  15.1 mmHg    TR Vmax:         342.00 cm/s MV Mean grad:  7.0 mmHg MV Vmax:       1.94 m/s MV Vmean:      121.0 cm/s MV Decel Time: 177 msec MV E velocity: 162.50 cm/s Nelva Bush MD Electronically signed by Nelva Bush MD Signature Date/Time: 08/10/2021/11:59:21 AM    Final      Medications:    sodium chloride Stopped (08/05/21 1441)   albumin human Stopped (08/11/21 0835)   heparin 1,050 Units/hr (08/11/21 1020)    Chlorhexidine Gluconate Cloth  6 each Topical Daily   [START ON 08/12/2021] docusate sodium  100 mg Oral Daily   folic acid  1 mg Oral Daily   furosemide  40 mg Intravenous BID   hydrocortisone   Rectal TID   insulin aspart  0-5 Units Subcutaneous QHS   insulin aspart  0-9 Units Subcutaneous TID WC   iron polysaccharides  150 mg Oral Daily   latanoprost  1 drop Both Eyes QHS   metoprolol tartrate  12.5 mg Oral Q6H   midodrine  10 mg Oral TID WC   pantoprazole (PROTONIX) IV  40 mg Intravenous Q12H   polyethylene glycol  17 g Oral Daily   Ensure Max Protein  11 oz Oral BID   vitamin B-12  500 mcg Oral Daily    Assessment/ Plan:     Principal Problem:   Acute blood loss anemia Active Problems:   Type 2 diabetes mellitus with renal complication (HCC)   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 diabetes mellitus (HCC)   Atrial fibrillation (HCC)   Pleural effusion   Acute on chronic diastolic CHF (congestive heart failure) (HCC)   Chronic anticoagulation   Shock circulatory (HCC)   Pericardial effusion  75 year old female with history of hypertension, hyperlipidemia, coronary artery disease, atrial fibrillation, congestive heart failure, chronic kidney disease and peripheral vascular disease now admitted with history of shortness of breath. She was found to have pericardial effusion and also significant anemia.  #1: Acute kidney injury: Patient with acute kidney injury on top of chronic kidney disease which is presently stable.  #2: Congestive heart failure: We will continue furosemide at the present  doses.  Patient is advised to stay on it 1000 cc fluid restriction.  #3: Pericardial effusion: Patient had pericardiocentesis with 700 cc of fluid removal.  Presently stable  #4: Diabetes: Continue insulin as per orders.  #5: Hypertension: On metoprolol and also midodrine for periodic hypotension.  #6: Hypokalemia: Secondary to diuresis.  Supplement as ordered.   LOS: [place], [facility] 10/22/20222:57 PM

## 2021-08-11 NOTE — Plan of Care (Signed)

## 2021-08-11 NOTE — Progress Notes (Signed)
[place] at [place] NAME: Karen Farley    MR#:  [id number]  DATE OF BIRTH:  02/27/1946  SUBJECTIVE:   whole lot better. She slept well. Eating well. Husband at bedside. Denies shortness of breath or chest pain.  Sat in the recliner y'day Had hrad BM with dark stool REVIEW OF SYSTEMS:   Review of Systems  Constitutional:  Negative for chills, fever and weight loss.  HENT:  Negative for ear discharge, ear pain and nosebleeds.   Eyes:  Negative for blurred vision, pain and discharge.  Respiratory:  Negative for sputum production, shortness of breath, wheezing and stridor.   Cardiovascular:  Positive for leg swelling. Negative for chest pain, palpitations, orthopnea and PND.  Gastrointestinal:  Negative for abdominal pain, diarrhea, nausea and vomiting.  Genitourinary:  Negative for frequency and urgency.  Musculoskeletal:  Negative for back pain and joint pain.  Neurological:  Positive for weakness. Negative for sensory change, speech change and focal weakness.  Psychiatric/Behavioral:  Negative for depression and hallucinations. The patient is not nervous/anxious.   Tolerating Diet:yes Tolerating PT: HHPT  DRUG ALLERGIES:  No Known Allergies  VITALS:  Blood pressure 118/74, pulse 95, temperature (!) 97.5 F (36.4 C), temperature source Oral, resp. rate (!) 26, height 5\' 2"  (1.575 m), weight 102.1 kg, SpO2 99 %.  PHYSICAL EXAMINATION:   Physical Exam  GENERAL:  75 y.o.-year-old patient lying in the bed with no acute distress. obese HEENT: Head atraumatic, normocephalic. Oropharynx and nasopharynx clear.  NECK:  Supple, no jugular venous distention. No thyroid enlargement, no tenderness.  LUNGS: decreased breath sounds bilaterally, no wheezing, rales, rhonchi. No use of accessory muscles of respiration.  CARDIOVASCULAR: S1, S2 normal. No murmurs, rubs, or gallops.  ABDOMEN: Soft, nontender, nondistended. Bowel sounds present. No  organomegaly or mass.  EXTREMITIES: Bilateral edema ++ NEUROLOGIC: non-focal PSYCHIATRIC:  patient is alert and oriented x 3.  SKIN: No obvious rash, lesion, or ulcer.   LABORATORY PANEL:  CBC Recent Labs  Lab 08/11/21 0620  WBC 9.0  HGB 9.9*  HCT 31.3*  PLT 214     Chemistries  Recent Labs  Lab 08/06/21 0510 08/07/21 0552 08/11/21 0620  NA 143   < > 142  K 3.4*   < > 3.3*  CL 104   < > 100  CO2 28   < > 35*  GLUCOSE 148*   < > 155*  BUN 67*   < > 34*  CREATININE 1.43*   < > 1.34*  CALCIUM 9.3   < > 8.8*  MG 2.2   < > 2.0  AST 21  --   --   ALT 15  --   --   ALKPHOS 40  --   --   BILITOT 2.6*  --   --    < > = values in this interval not displayed.    Cardiac Enzymes No results for input(s): TROPONINI in the last 168 hours. RADIOLOGY:  ECHOCARDIOGRAM LIMITED  Result Date: 08/10/2021    ECHOCARDIOGRAM LIMITED REPORT   Patient Name:   Karen Farley Date of Exam: 08/10/2021 Medical Rec #:  [id number]          Height:       62.0 in Accession #:    [id number]         Weight:       224.9 lb Date of Birth:  11-24-1945  BSA:          2.009 m Patient Age:    28 years           BP:           105/70 mmHg Patient Gender: F                  HR:           110 bpm. Exam Location:  [facility] Procedure: 2D Echo, Limited Color Doppler and Cardiac Doppler Indications:     I31.3 Pericardial effusion  History:         Patient has prior history of Echocardiogram examinations, most                  recent 08/08/2021. HFpEF, CKD; Risk Factors:Diabetes,                  Hypertension and Dyslipidemia.  Sonographer:     Charmayne Sheer Referring Phys:  [phone] CHRISTOPHER END Diagnosing Phys: Nelva Bush MD  Sonographer Comments: Technically difficult study due to poor echo windows. Image acquisition challenging due to patient body habitus. IMPRESSIONS  1. Left ventricular ejection fraction, by estimation, is 60 to 65%. The left ventricle has normal function. There is mild left ventricular  hypertrophy.  2. Right ventricular systolic function is normal. The right ventricular size is normal. There is severely elevated pulmonary artery systolic pressure.  3. A small pericardial effusion is present. The pericardial effusion is posterior to the left ventricle and localized near the right atrium.  4. The aortic valve has an indeterminant number of cusps. There is moderate calcification of the aortic valve. There is moderate thickening of the aortic valve.  5. The inferior vena cava is dilated in size with <50% respiratory variability, suggesting right atrial pressure of 15 mmHg. FINDINGS  Left Ventricle: Left ventricular ejection fraction, by estimation, is 60 to 65%. The left ventricle has normal function. There is mild left ventricular hypertrophy. Right Ventricle: The right ventricular size is normal. No increase in right ventricular wall thickness. Right ventricular systolic function is normal. There is severely elevated pulmonary artery systolic pressure. The tricuspid regurgitant velocity is 3.42 m/s, and with an assumed right atrial pressure of 15 mmHg, the estimated right ventricular systolic pressure is 10.9 mmHg. Pericardium: A small pericardial effusion is present. The pericardial effusion is posterior to the left ventricle and localized near the right atrium. Presence of pericardial fat pad. Mitral Valve: There is mild thickening of the mitral valve leaflet(s). Mild mitral annular calcification. MV peak gradient, 15.1 mmHg. The mean mitral valve gradient is 7.0 mmHg. Tricuspid Valve: The tricuspid valve is not well visualized. Aortic Valve: The aortic valve has an indeterminant number of cusps. There is moderate calcification of the aortic valve. There is moderate thickening of the aortic valve. Venous: The inferior vena cava is dilated in size with less than 50% respiratory variability, suggesting right atrial pressure of 15 mmHg. LEFT VENTRICLE PLAX 2D LVIDd:         4.30 cm Diastology LVIDs:          2.80 cm LV e' medial:    7.29 cm/s LV PW:         0.90 cm LV E/e' medial:  22.3 LV IVS:        0.90 cm LV e' lateral:   9.36 cm/s  LV E/e' lateral: 17.4  LEFT ATRIUM         Index LA diam:    4.60 cm 2.29 cm/m  MITRAL VALVE                TRICUSPID VALVE MV Area (PHT): 4.30 cm     TR Peak grad:   46.8 mmHg MV Peak grad:  15.1 mmHg    TR Vmax:        342.00 cm/s MV Mean grad:  7.0 mmHg MV Vmax:       1.94 m/s MV Vmean:      121.0 cm/s MV Decel Time: 177 msec MV E velocity: 162.50 cm/s Nelva Bush MD Electronically signed by Nelva Bush MD Signature Date/Time: 08/10/2021/11:59:21 AM    Final    ASSESSMENT AND PLAN:  Karen Farley is a 75 y.o. female with medical history significant for HFpEF, permanent atrial fibrillation on Eliquis, PAD, type 2 diabetes, CKD stage IIIb, lymphedema presented to hospital with increasing shortness of breath and edema.  Patient has been noticing increasing abdominal distention since her admission in August with severe lower extremity edema and dyspnea and exertion.  GI bleed with hemorrhagic shock. Patient was noted to have significant drop in hemoglobin to 9.7 from 12.4 and was transferred to the ICU.  GI following the patient at this time.  No further bowel movements reported.  Unable to do any GI intervention due to patient being hemodynamically unstable on vasopressors with large pericardial effusion.   Large pericardial effusion s/p percardiocentesis Large Pleural effusion s/p thoracentesis Patient underwent a pericardiocentesis on 08/05/2021 with removal of 700 mL of pleural fluid.  Inflammatory markers were unremarkable.  Cardiology monitoring the patient while in the hospital.  Still on vasopressors due to hypotension. --repeat echo 10/21--no pericaridal effusion    Left pleural effusion secondary to acute exacerbation of chronic diastolic CHF -status post thoracocentesis with removal of 600 mL of fluid on  07/28/2021. --10/20-- torsemide 20 mg qd with IV albumin --10/21-- IV lqasix 40 mg bid per cardiology --10/22 cont IV lasix--good uop    Hypotension Secondary to hemorrhagic shock --Antihypertensives on hold.   --cont po  midodrine.   --IV Albumin       Acute kidney injury on CKD stage 3b --Patient's baseline creatinine around 1.4-1.7.  Currently at baseline.   --Nephrology followed the patient during hospitalization.  -- Benazepril and spironolactone on hold.   Permanent atrial fibrillation --Was previously on Eliquis.  Currently on hold due to GI bleed.  -- Rate controlled at this time--not on any po rate controlling meds --10/21--started on IV heparin gtt--monitor hgb  --10/22-- hgb remains stable  Type 2 diabetes mellitus Diet controlled at home.   --Continue sliding scale insulin while in the hospital.   Iron deficiency/Vitamin B12 level 284, target >400 --patient received Venofer 200 mg IV daily for 5 days.   -cont oral iron and vitamin B12 supplements.     DVT prophylaxis: IV heparin gtt   Code Status: Full code     Family Communication:  Spoke with the patient's husband at bedside.   Disposition Plan:     Likely home in 1-2 days   Consults called:  Gastroenterology, cardiology, nephrology, critical care   Admission status: Inpatient   Procedures:  Thoracocentesis on 07/28/2021 with removal of 600 mL fluid. CTA of the abdomen Pericardiocentesis. Level of care: Progressive Cardiac Status is: Inpatient   TOTAL TIME TAKING CARE OF THIS PATIENT: 25 minutes.  >50% time  spent on counselling and coordination of care  Note: This dictation was prepared with Dragon dictation along with smaller phrase technology. Any transcriptional errors that result from this process are unintentional.  Fritzi Mandes M.D    [facility]   CC: Primary care physician; Valerie Roys, DO Patient ID: Karen Farley, female   DOB: [birth year], 75 y.o.   MRN: [id number]

## 2021-08-11 NOTE — Progress Notes (Signed)
Progress Note  Patient Name: Karen Farley Date of Encounter: 08/11/2021  [facility] Cardiologist: Kate Sable, MD   Subjective   Resting comfortably in bed, husband at the bedside Significant arm and leg swelling, abdominal distention Total -8 L.  Remains on IV Lasix twice daily  Inpatient Medications    Scheduled Meds:  Chlorhexidine Gluconate Cloth  6 each Topical Daily   [START ON 08/12/2021] docusate sodium  100 mg Oral Daily   folic acid  1 mg Oral Daily   furosemide  40 mg Intravenous BID   hydrocortisone   Rectal TID   insulin aspart  0-5 Units Subcutaneous QHS   insulin aspart  0-9 Units Subcutaneous TID WC   iron polysaccharides  150 mg Oral Daily   latanoprost  1 drop Both Eyes QHS   metoprolol tartrate  12.5 mg Oral Q6H   midodrine  10 mg Oral TID WC   pantoprazole (PROTONIX) IV  40 mg Intravenous Q12H   polyethylene glycol  17 g Oral Daily   Ensure Max Protein  11 oz Oral BID   vitamin B-12  500 mcg Oral Daily   Continuous Infusions:  sodium chloride Stopped (08/05/21 1441)   albumin human Stopped (08/11/21 0835)   heparin 1,050 Units/hr (08/11/21 1020)   PRN Meds: acetaminophen, bisacodyl, lidocaine HCl (PF), lip balm, traMADol   Vital Signs    Vitals:   08/11/21 0500 08/11/21 0600 08/11/21 0800 08/11/21 1200  BP:  (!) 89/72 118/74 106/75  Pulse: 85 76 95 91  Resp: 18 15 (!) 26 20  Temp:   (!) 97.5 F (36.4 C) 98.6 F (37 C)  TempSrc:   Oral Oral  SpO2: 94% 96% 99% 96%  Weight: 102.1 kg     Height:        Intake/Output Summary (Last 24 hours) at 08/11/2021 1558 Last data filed at 08/11/2021 1400 Gross per 24 hour  Intake 1230.32 ml  Output 1550 ml  Net -319.68 ml   Last 3 Weights 08/11/2021 08/06/2021 08/05/2021  Weight (lbs) 225 lb 1.4 oz 224 lb 13.9 oz 223 lb 5.2 oz  Weight (kg) 102.1 kg 102 kg 101.3 kg      Telemetry    Normal sinus rhythm- Personally Reviewed  ECG    - Personally Reviewed  Physical Exam    GEN: No acute distress.  Obese Neck: No JVD Cardiac: Irregular irregular no murmurs, rubs, or gallops.  Significant pitting lower extremity edema Respiratory: Clear to auscultation bilaterally. GI: Soft, nontender, non-distended  MS: No edema; No deformity. Neuro:  Nonfocal  Psych: Normal affect   Labs    High Sensitivity Troponin:   Recent Labs  Lab 07/26/21 1708  TROPONINIHS 17     Chemistry Recent Labs  Lab 08/06/21 0510 08/07/21 0552 08/09/21 0532 08/10/21 0809 08/11/21 0620  NA 143   < > 141 144 142  K 3.4*   < > 3.3* 3.3* 3.3*  CL 104   < > 101 101 100  CO2 28   < > 34* 32 35*  GLUCOSE 148*   < > 146* 151* 155*  BUN 67*   < > 35* 32* 34*  CREATININE 1.43*   < > 1.39* 1.47* 1.34*  CALCIUM 9.3   < > 8.9 8.7* 8.8*  MG 2.2   < > 2.0 1.9 2.0  PROT 5.3*  --   --   --   --   ALBUMIN 3.6  --   --   --   --  AST 21  --   --   --   --   ALT 15  --   --   --   --   ALKPHOS 40  --   --   --   --   BILITOT 2.6*  --   --   --   --   GFRNONAA 38*   < > 40* 37* 41*  ANIONGAP 11   < > 6 11 7    < > = values in this interval not displayed.    Lipids No results for input(s): CHOL, TRIG, HDL, LABVLDL, LDLCALC, CHOLHDL in the last 168 hours.  Hematology Recent Labs  Lab 08/09/21 0532 08/10/21 0809 08/11/21 0620  WBC 8.2 8.1 9.0  RBC 3.50* 3.39* 3.43*  HGB 9.7* 9.7* 9.9*  HCT 30.9* 30.4* 31.3*  MCV 88.3 89.7 91.3  MCH 27.7 28.6 28.9  MCHC 31.4 31.9 31.6  RDW 24.9* 25.8* 26.1*  PLT 186 196 214   Thyroid No results for input(s): TSH, FREET4 in the last 168 hours.  BNP Recent Labs  Lab 08/05/21 0444  BNP 184.5*    DDimer No results for input(s): DDIMER in the last 168 hours.   Radiology    ECHOCARDIOGRAM LIMITED  Result Date: 08/10/2021    ECHOCARDIOGRAM LIMITED REPORT   Patient Name:   Karen Farley Date of Exam: 08/10/2021 Medical Rec #:  [id number]          Height:       62.0 in Accession #:    [id number]         Weight:       224.9 lb Date of  Birth:  Mar 15, 1946           BSA:          2.009 m Patient Age:    75 years           BP:           105/70 mmHg Patient Gender: F                  HR:           110 bpm. Exam Location:  [facility] Procedure: 2D Echo, Limited Color Doppler and Cardiac Doppler Indications:     I31.3 Pericardial effusion  History:         Patient has prior history of Echocardiogram examinations, most                  recent 08/08/2021. HFpEF, CKD; Risk Factors:Diabetes,                  Hypertension and Dyslipidemia.  Sonographer:     Charmayne Sheer Referring Phys:  [phone] CHRISTOPHER END Diagnosing Phys: Nelva Bush MD  Sonographer Comments: Technically difficult study due to poor echo windows. Image acquisition challenging due to patient body habitus. IMPRESSIONS  1. Left ventricular ejection fraction, by estimation, is 60 to 65%. The left ventricle has normal function. There is mild left ventricular hypertrophy.  2. Right ventricular systolic function is normal. The right ventricular size is normal. There is severely elevated pulmonary artery systolic pressure.  3. A small pericardial effusion is present. The pericardial effusion is posterior to the left ventricle and localized near the right atrium.  4. The aortic valve has an indeterminant number of cusps. There is moderate calcification of the aortic valve. There is moderate thickening of the aortic valve.  5. The inferior vena cava is dilated in size  with <50% respiratory variability, suggesting right atrial pressure of 15 mmHg. FINDINGS  Left Ventricle: Left ventricular ejection fraction, by estimation, is 60 to 65%. The left ventricle has normal function. There is mild left ventricular hypertrophy. Right Ventricle: The right ventricular size is normal. No increase in right ventricular wall thickness. Right ventricular systolic function is normal. There is severely elevated pulmonary artery systolic pressure. The tricuspid regurgitant velocity is 3.42 m/s, and with an assumed right  atrial pressure of 15 mmHg, the estimated right ventricular systolic pressure is 68.3 mmHg. Pericardium: A small pericardial effusion is present. The pericardial effusion is posterior to the left ventricle and localized near the right atrium. Presence of pericardial fat pad. Mitral Valve: There is mild thickening of the mitral valve leaflet(s). Mild mitral annular calcification. MV peak gradient, 15.1 mmHg. The mean mitral valve gradient is 7.0 mmHg. Tricuspid Valve: The tricuspid valve is not well visualized. Aortic Valve: The aortic valve has an indeterminant number of cusps. There is moderate calcification of the aortic valve. There is moderate thickening of the aortic valve. Venous: The inferior vena cava is dilated in size with less than 50% respiratory variability, suggesting right atrial pressure of 15 mmHg. LEFT VENTRICLE PLAX 2D LVIDd:         4.30 cm Diastology LVIDs:         2.80 cm LV e' medial:    7.29 cm/s LV PW:         0.90 cm LV E/e' medial:  22.3 LV IVS:        0.90 cm LV e' lateral:   9.36 cm/s                        LV E/e' lateral: 17.4  LEFT ATRIUM         Index LA diam:    4.60 cm 2.29 cm/m  MITRAL VALVE                TRICUSPID VALVE MV Area (PHT): 4.30 cm     TR Peak grad:   46.8 mmHg MV Peak grad:  15.1 mmHg    TR Vmax:        342.00 cm/s MV Mean grad:  7.0 mmHg MV Vmax:       1.94 m/s MV Vmean:      121.0 cm/s MV Decel Time: 177 msec MV E velocity: 162.50 cm/s Nelva Bush MD Electronically signed by Nelva Bush MD Signature Date/Time: 08/10/2021/11:59:21 AM    Final     Cardiac Studies   Echo  1. Left ventricular ejection fraction, by estimation, is 60 to 65%. The  left ventricle has normal function. There is mild left ventricular  hypertrophy.   2. Right ventricular systolic function is normal. The right ventricular  size is normal. There is severely elevated pulmonary artery systolic  pressure.   3. A small pericardial effusion is present. The pericardial effusion is   posterior to the left ventricle and localized near the right atrium.   4. The aortic valve has an indeterminant number of cusps. There is  moderate calcification of the aortic valve. There is moderate thickening  of the aortic valve.   5. The inferior vena cava is dilated in size with <50% respiratory  variability, suggesting right atrial pressure of 15 mmHg.    Patient Profile     75 y.o. female with history of HFpEF, hypertension, CKD 3, permanent atrial fibrillation presenting with hypotension, GI bleed and large  pericardial effusion s/p pericardial drainage placement.   Pericardial effusion, hemorrhagic -S/p pericardiocentesis and drain placement with hemorrhagic effusion -Pericardial drainage tube removed this past week, Repeat echocardiogram with no significant reaccumulation of pericardial fluid   2.  HFpEF, massive volume overload Is edematous Torsemide twice daily oral dosing changed to IV 8 L negative, still with significant fluid to go Stable renal function, will increase up to every 8 hours   3.  Permanent A. Fib Rate elevated on today's rounds, was better yesterday Will continue to monitor, may need low-dose metoprolol As outpatient was taking metoprolol succinate 50 daily with Eliquis   4.  Hypotension/shock In the setting of presumed lower GI bleed, "blow out" over the weekend Was weaned off pressors Remains on midodrine 10, still with low blood pressure Metoprolol restarted for rate control Hemoglobin stable hemoglobin 9.9     Total encounter time more than 35 minutes  Greater than 50% was spent in counseling and coordination of care with the patient   For questions or updates, please contact [facility] Please consult [email or web address] for contact info under        Signed, Ida Rogue, MD  08/11/2021, 3:58 PM

## 2021-08-11 NOTE — Progress Notes (Signed)
Patient continues to refuse wound care.  MD aware.  Educated and refused.

## 2021-08-12 DIAGNOSIS — I5033 Acute on chronic diastolic (congestive) heart failure: Secondary | ICD-10-CM | POA: Diagnosis not present

## 2021-08-12 DIAGNOSIS — R188 Other ascites: Secondary | ICD-10-CM

## 2021-08-12 DIAGNOSIS — D62 Acute posthemorrhagic anemia: Secondary | ICD-10-CM | POA: Diagnosis not present

## 2021-08-12 DIAGNOSIS — J9601 Acute respiratory failure with hypoxia: Secondary | ICD-10-CM | POA: Diagnosis not present

## 2021-08-12 LAB — CBC
HCT: 30.8 % — ABNORMAL LOW (ref 36.0–46.0)
Hemoglobin: 9.6 g/dL — ABNORMAL LOW (ref 12.0–15.0)
MCH: 28.3 pg (ref 26.0–34.0)
MCHC: 31.2 g/dL (ref 30.0–36.0)
MCV: 90.9 fL (ref 80.0–100.0)
Platelets: 229 10*3/uL (ref 150–400)
RBC: 3.39 MIL/uL — ABNORMAL LOW (ref 3.87–5.11)
RDW: 26.1 % — ABNORMAL HIGH (ref 11.5–15.5)
WBC: 8.2 10*3/uL (ref 4.0–10.5)
nRBC: 0 % (ref 0.0–0.2)

## 2021-08-12 LAB — BASIC METABOLIC PANEL
Anion gap: 15 (ref 5–15)
BUN: 34 mg/dL — ABNORMAL HIGH (ref 8–23)
CO2: 27 mmol/L (ref 22–32)
Calcium: 8.9 mg/dL (ref 8.9–10.3)
Chloride: 99 mmol/L (ref 98–111)
Creatinine, Ser: 1.43 mg/dL — ABNORMAL HIGH (ref 0.44–1.00)
GFR, Estimated: 38 mL/min — ABNORMAL LOW (ref >60)
Glucose, Bld: 155 mg/dL — ABNORMAL HIGH (ref 70–99)
Potassium: 4.7 mmol/L (ref 3.5–5.1)
Sodium: 141 mmol/L (ref 135–145)

## 2021-08-12 LAB — MAGNESIUM: Magnesium: 2 mg/dL (ref 1.7–2.4)

## 2021-08-12 LAB — PHOSPHORUS: Phosphorus: 2.4 mg/dL — ABNORMAL LOW (ref 2.5–4.6)

## 2021-08-12 LAB — GLUCOSE, CAPILLARY
Glucose-Capillary: 133 mg/dL — ABNORMAL HIGH (ref 70–99)
Glucose-Capillary: 131 mg/dL — ABNORMAL HIGH (ref 70–99)
Glucose-Capillary: 153 mg/dL — ABNORMAL HIGH (ref 70–99)
Glucose-Capillary: 118 mg/dL — ABNORMAL HIGH (ref 70–99)

## 2021-08-12 LAB — HEPARIN LEVEL (UNFRACTIONATED): Heparin Unfractionated: 0.41 IU/mL (ref 0.30–0.70)

## 2021-08-12 MED ORDER — POTASSIUM CHLORIDE 20 MEQ PO PACK
40.0000 meq | PACK | Freq: Two times a day (BID) | ORAL | Status: AC
  Administered 2021-08-12 (×2): 40 meq via ORAL
  Filled 2021-08-12 (×2): qty 2

## 2021-08-12 MED ORDER — APIXABAN 5 MG PO TABS
5.0000 mg | ORAL_TABLET | Freq: Two times a day (BID) | ORAL | Status: DC
  Administered 2021-08-12 – 2021-08-28 (×32): 5 mg via ORAL
  Filled 2021-08-12 (×32): qty 1

## 2021-08-12 MED ORDER — POTASSIUM CHLORIDE 20 MEQ PO PACK
40.0000 meq | PACK | Freq: Every day | ORAL | Status: DC
  Filled 2021-08-12: qty 2

## 2021-08-12 MED ORDER — FUROSEMIDE 10 MG/ML IJ SOLN
40.0000 mg | Freq: Two times a day (BID) | INTRAMUSCULAR | Status: DC
  Administered 2021-08-13: 40 mg via INTRAVENOUS
  Filled 2021-08-12: qty 4

## 2021-08-12 NOTE — Progress Notes (Signed)
Progress Note  Patient Name: Karen Farley Date of Encounter: 08/12/2021  [facility] Cardiologist: Kate Sable, MD   Subjective   Moved out of the ICU to the floor No events overnight, resting comfortably in recliner with legs elevated husband at the bedside Less erythema of her legs, still with significant anasarca arms legs Tolerating Lasix every 8 hours  Inpatient Medications    Scheduled Meds:  apixaban  5 mg Oral BID   Chlorhexidine Gluconate Cloth  6 each Topical Daily   docusate sodium  100 mg Oral Daily   folic acid  1 mg Oral Daily   furosemide  40 mg Intravenous Q8H   hydrocortisone   Rectal TID   insulin aspart  0-5 Units Subcutaneous QHS   insulin aspart  0-9 Units Subcutaneous TID WC   iron polysaccharides  150 mg Oral Daily   latanoprost  1 drop Both Eyes QHS   metoprolol tartrate  12.5 mg Oral Q6H   midodrine  10 mg Oral TID WC   pantoprazole (PROTONIX) IV  40 mg Intravenous Q12H   polyethylene glycol  17 g Oral Daily   potassium chloride  40 mEq Oral BID   [START ON 08/13/2021] potassium chloride  40 mEq Oral Daily   Ensure Max Protein  11 oz Oral BID   vitamin B-12  500 mcg Oral Daily   Continuous Infusions:  sodium chloride Stopped (08/05/21 1441)   albumin human 12.5 g (08/12/21 0929)   PRN Meds: acetaminophen, bisacodyl, lidocaine HCl (PF), lip balm, traMADol   Vital Signs    Vitals:   08/12/21 0600 08/12/21 0700 08/12/21 0759 08/12/21 1230  BP: (!) 77/51 (!) 82/54 109/67 (!) 98/58  Pulse: 73 88 86 76  Resp: 20 (!) 22  18  Temp:   (!) 97.4 F (36.3 C)   TempSrc:   Oral   SpO2: 94% 97% 100% 100%  Weight:      Height:        Intake/Output Summary (Last 24 hours) at 08/12/2021 1449 Last data filed at 08/12/2021 1133 Gross per 24 hour  Intake 362.45 ml  Output 1950 ml  Net -1587.55 ml   Last 3 Weights 08/12/2021 08/11/2021 08/06/2021  Weight (lbs) 220 lb 7.4 oz 225 lb 1.4 oz 224 lb 13.9 oz  Weight (kg) 100 kg 102.1  kg 102 kg      Telemetry    Atrial fibrillation personally Reviewed  ECG    - Personally Reviewed  Physical Exam   Constitutional: Morbidly obese oriented to person, place, and time. No distress.  HENT:  Head: Grossly normal Eyes:  no discharge. No scleral icterus.  Neck: Unable to estimate JVD, no carotid bruits  Cardiovascular: Irregularly irregular no murmurs appreciated 2+ pitting lower extremity edema, swelling of her forearms and hands Pulmonary/Chest: Clear to auscultation bilaterally, no wheezes or rails Abdominal: Soft.  no distension.  no tenderness.  Musculoskeletal: Normal range of motion Neurological:  normal muscle tone. Coordination normal. No atrophy Skin: Skin warm and dry Psychiatric: normal affect, pleasant   Labs    High Sensitivity Troponin:   Recent Labs  Lab 07/26/21 1708  TROPONINIHS 17     Chemistry Recent Labs  Lab 08/06/21 0510 08/07/21 0552 08/10/21 0809 08/11/21 0620 08/12/21 0534 08/12/21 1308  NA 143   < > 144 142  --  141  K 3.4*   < > 3.3* 3.3*  --  4.7  CL 104   < > 101 100  --  99  CO2 28   < > 32 35*  --  27  GLUCOSE 148*   < > 151* 155*  --  155*  BUN 67*   < > 32* 34*  --  34*  CREATININE 1.43*   < > 1.47* 1.34*  --  1.43*  CALCIUM 9.3   < > 8.7* 8.8*  --  8.9  MG 2.2   < > 1.9 2.0 2.0  --   PROT 5.3*  --   --   --   --   --   ALBUMIN 3.6  --   --   --   --   --   AST 21  --   --   --   --   --   ALT 15  --   --   --   --   --   ALKPHOS 40  --   --   --   --   --   BILITOT 2.6*  --   --   --   --   --   GFRNONAA 38*   < > 37* 41*  --  38*  ANIONGAP 11   < > 11 7  --  15   < > = values in this interval not displayed.    Lipids No results for input(s): CHOL, TRIG, HDL, LABVLDL, LDLCALC, CHOLHDL in the last 168 hours.  Hematology Recent Labs  Lab 08/10/21 0809 08/11/21 0620 08/12/21 0534  WBC 8.1 9.0 8.2  RBC 3.39* 3.43* 3.39*  HGB 9.7* 9.9* 9.6*  HCT 30.4* 31.3* 30.8*  MCV 89.7 91.3 90.9  MCH 28.6 28.9  28.3  MCHC 31.9 31.6 31.2  RDW 25.8* 26.1* 26.1*  PLT 196 214 229   Thyroid No results for input(s): TSH, FREET4 in the last 168 hours.  BNP No results for input(s): BNP, PROBNP in the last 168 hours.   DDimer No results for input(s): DDIMER in the last 168 hours.   Radiology    No results found.  Cardiac Studies   Echo  1. Left ventricular ejection fraction, by estimation, is 60 to 65%. The  left ventricle has normal function. There is mild left ventricular  hypertrophy.   2. Right ventricular systolic function is normal. The right ventricular  size is normal. There is severely elevated pulmonary artery systolic  pressure.   3. A small pericardial effusion is present. The pericardial effusion is  posterior to the left ventricle and localized near the right atrium.   4. The aortic valve has an indeterminant number of cusps. There is  moderate calcification of the aortic valve. There is moderate thickening  of the aortic valve.   5. The inferior vena cava is dilated in size with <50% respiratory  variability, suggesting right atrial pressure of 15 mmHg.    Patient Profile     75 y.o. female with history of HFpEF, hypertension, CKD 3, permanent atrial fibrillation presenting with hypotension, GI bleed and large pericardial effusion s/p pericardial drainage placement.  Pericardial effusion, hemorrhagic -S/p pericardiocentesis and drain placement with hemorrhagic effusion -Pericardial drainage tube removed this past week, Repeat echo with no reaccumulation of fluid, hemodynamically stable   2.  HFpEF, massive volume overload Anasarca arms legs abdomen Tolerating IV Lasix every 8 hours, -8 L Renal function stable, though given low blood pressure will decrease back to twice daily dosing   3.  Permanent A. Fib Rate elevated on today's rounds, was better yesterday Will continue  to monitor, may need low-dose metoprolol As outpatient was taking metoprolol succinate 50 daily  with Eliquis   4.  Hypotension/shock In the setting of presumed lower GI bleed, "blow out" 1 week ago Initially placed on pressors, this has since been weaned Requiring midodrine for blood pressure support, blood pressure still low  Metoprolol restarted for rate control Hemoglobin stable hemoglobin 9.9     Total encounter time more than 35 minutes  Greater than 50% was spent in counseling and coordination of care with the patient   For questions or updates, please contact [facility] Please consult [email or web address] for contact info under        Signed, Ida Rogue, MD  08/12/2021, 2:49 PM

## 2021-08-12 NOTE — Progress Notes (Signed)
[facility]  PROGRESS NOTE   Subjective:   Patient feels much better today.  Objective:  Vital signs in last 24 hours:  Temp:  [97.4 F (36.3 C)-97.8 F (36.6 C)] 97.4 F (36.3 C) (10/23 0759) Pulse Rate:  [60-88] 76 (10/23 1230) Resp:  [13-28] 18 (10/23 1230) BP: (77-109)/(51-85) 98/58 (10/23 1230) SpO2:  [93 %-100 %] 100 % (10/23 1230) Weight:  [100 kg] 100 kg (10/23 0500)  Weight change: -2.1 kg Filed Weights   08/06/21 0253 08/11/21 0500 08/12/21 0500  Weight: 102 kg 102.1 kg 100 kg    Intake/Output: I/O last 3 completed shifts: In: 1349.5 [P.O.:840; I.V.:393.4; IV Piggyback:116.2] Out: 2500 [Urine:2500]   Intake/Output this shift:  Total I/O In: -  Out: 1000 [Urine:1000]  Physical Exam: General:  No acute distress  Head:  Normocephalic, atraumatic. Moist oral mucosal membranes  Eyes:  Anicteric  Neck:  Supple  Lungs:   Clear to auscultation, normal effort  Heart:  S1S2 no rubs  Abdomen:   Soft, nontender, bowel sounds present  Extremities:  peripheral edema.  Neurologic:  Awake, alert, following commands  Skin:  No lesions  Access:     Basic Metabolic Panel: Recent Labs  Lab 08/07/21 0552 08/08/21 0445 08/09/21 0532 08/10/21 0809 08/11/21 0620 08/12/21 0534  NA 144 141 141 144 142  --   K 3.4* 3.6 3.3* 3.3* 3.3*  --   CL 104 102 101 101 100  --   CO2 33* 30 34* 32 35*  --   GLUCOSE 147* 158* 146* 151* 155*  --   BUN 51* 37* 35* 32* 34*  --   CREATININE 1.30* 1.33* 1.39* 1.47* 1.34*  --   CALCIUM 9.2 8.9 8.9 8.7* 8.8*  --   MG 2.1  --  2.0 1.9 2.0 2.0  PHOS 3.1  --  2.3* 2.0* 2.2* 2.4*    CBC: Recent Labs  Lab 08/06/21 0510 08/06/21 2159 08/08/21 0445 08/09/21 0532 08/10/21 0809 08/11/21 0620 08/12/21 0534  WBC 14.6*   < > 10.5 8.2 8.1 9.0 8.2  NEUTROABS 10.8*  --   --  6.2 6.2 6.8  --   HGB 10.3*   < > 9.3* 9.7* 9.7* 9.9* 9.6*  HCT 31.9*   < > 30.0* 30.9* 30.4* 31.3* 30.8*  MCV 86.2   < > 87.7 88.3 89.7 91.3 90.9   PLT 227   < > 192 186 196 214 229   < > = values in this interval not displayed.     Urinalysis: No results for input(s): COLORURINE, LABSPEC, PHURINE, GLUCOSEU, HGBUR, BILIRUBINUR, KETONESUR, PROTEINUR, UROBILINOGEN, NITRITE, LEUKOCYTESUR in the last 72 hours.  Invalid input(s): APPERANCEUR    Imaging: No results found.   Medications:    sodium chloride Stopped (08/05/21 1441)   albumin human 12.5 g (08/12/21 0929)    apixaban  5 mg Oral BID   Chlorhexidine Gluconate Cloth  6 each Topical Daily   docusate sodium  100 mg Oral Daily   folic acid  1 mg Oral Daily   furosemide  40 mg Intravenous Q8H   hydrocortisone   Rectal TID   insulin aspart  0-5 Units Subcutaneous QHS   insulin aspart  0-9 Units Subcutaneous TID WC   iron polysaccharides  150 mg Oral Daily   latanoprost  1 drop Both Eyes QHS   metoprolol tartrate  12.5 mg Oral Q6H   midodrine  10 mg Oral TID WC   pantoprazole (PROTONIX) IV  40  mg Intravenous Q12H   polyethylene glycol  17 g Oral Daily   potassium chloride  40 mEq Oral BID   [START ON 08/13/2021] potassium chloride  40 mEq Oral Daily   Ensure Max Protein  11 oz Oral BID   vitamin B-12  500 mcg Oral Daily    Assessment/ Plan:     Principal Problem:   Acute blood loss anemia Active Problems:   Type 2 diabetes mellitus with renal complication (HCC)   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 diabetes mellitus (HCC)   Atrial fibrillation (HCC)   Pleural effusion   Acute on chronic diastolic CHF (congestive heart failure) (HCC)   Chronic anticoagulation   Shock circulatory (HCC)   Pericardial effusion  75 year old female with history of hypertension, hyperlipidemia, coronary artery disease, atrial fibrillation, congestive heart failure, chronic kidney disease and peripheral vascular disease now admitted with history of shortness of breath. She was found to have pericardial effusion and also significant anemia.   #1: Acute kidney injury: Patient with acute  kidney injury on top of chronic kidney disease which is presently stable.   #2: Congestive heart failure: We will continue furosemide at the present doses.  Patient is advised to stay on it 1000 cc fluid restriction.   #3: Pericardial effusion: Patient had pericardiocentesis with 700 cc of fluid removal.  Presently stable   #4: Diabetes: Continue insulin as per orders.   #5: Hypertension: On metoprolol and also midodrine for periodic hypotension.   #6: Hypokalemia: Secondary to diuresis.  Supplement as ordered. Will follow.   LOS: [place], [facility] 10/23/20221:01 PM

## 2021-08-12 NOTE — Progress Notes (Signed)
[facility] [place] NAME: Karen Farley    MR#:  [id number]  DATE OF BIRTH:  [birth year]  SUBJECTIVE:   Transferred to [facility]. No complaints . No Rectal bleed, CP or SOB REVIEW OF SYSTEMS:   Review of Systems  Constitutional:  Negative for chills, fever and weight loss.  HENT:  Negative for ear discharge, ear pain and nosebleeds.   Eyes:  Negative for blurred vision, pain and discharge.  Respiratory:  Negative for sputum production, shortness of breath, wheezing and stridor.   Cardiovascular:  Positive for leg swelling. Negative for chest pain, palpitations, orthopnea and PND.  Gastrointestinal:  Negative for abdominal pain, diarrhea, nausea and vomiting.  Genitourinary:  Negative for frequency and urgency.  Musculoskeletal:  Negative for back pain and joint pain.  Neurological:  Positive for weakness. Negative for sensory change, speech change and focal weakness.  Psychiatric/Behavioral:  Negative for depression and hallucinations. The patient is not nervous/anxious.   Tolerating Diet:yes Tolerating PT: HHPT  DRUG ALLERGIES:  No Known Allergies  VITALS:  Blood pressure 109/67, pulse 86, temperature (!) 97.4 F (36.3 C), temperature source Oral, resp. rate (!) 22, height 5\' 2"  (1.575 m), weight 100 kg, SpO2 100 %.  PHYSICAL EXAMINATION:   Physical Exam  GENERAL:  75 y.o.-year-old patient lying in the bed with no acute distress. obese HEENT: Head atraumatic, normocephalic. Oropharynx and nasopharynx clear.  NECK:  Supple, no jugular venous distention. No thyroid enlargement, no tenderness.  LUNGS: decreased breath sounds bilaterally, no wheezing, rales, rhonchi. No use of accessory muscles of respiration.  CARDIOVASCULAR: S1, S2 normal. No murmurs, rubs, or gallops.  ABDOMEN: Soft, nontender, nondistended. Bowel sounds present. No organomegaly or mass.  EXTREMITIES: Bilateral edema ++ NEUROLOGIC: non-focal PSYCHIATRIC:  patient  is alert and oriented x 3.  SKIN: No obvious rash, lesion, or ulcer.   LABORATORY PANEL:  CBC Recent Labs  Lab 08/12/21 0534  WBC 8.2  HGB 9.6*  HCT 30.8*  PLT 229     Chemistries  Recent Labs  Lab 08/06/21 0510 08/07/21 0552 08/11/21 0620 08/12/21 0534  NA 143   < > 142  --   K 3.4*   < > 3.3*  --   CL 104   < > 100  --   CO2 28   < > 35*  --   GLUCOSE 148*   < > 155*  --   BUN 67*   < > 34*  --   CREATININE 1.43*   < > 1.34*  --   CALCIUM 9.3   < > 8.8*  --   MG 2.2   < > 2.0 2.0  AST 21  --   --   --   ALT 15  --   --   --   ALKPHOS 40  --   --   --   BILITOT 2.6*  --   --   --    < > = values in this interval not displayed.    Cardiac Enzymes No results for input(s): TROPONINI in the last 168 hours. RADIOLOGY:  No results found. ASSESSMENT AND PLAN:  Karen Farley is a 75 y.o. female with medical history significant for HFpEF, permanent atrial fibrillation on Eliquis, PAD, type 2 diabetes, CKD stage IIIb, lymphedema presented to hospital with increasing shortness of breath and edema.  Patient has been noticing increasing abdominal distention since her admission in August with severe lower extremity edema and  dyspnea and exertion.  GI bleed with hemorrhagic shock. --Patient was noted to have significant drop in hemoglobin to 9.7 from 12.4 and was transferred to the ICU.   --GI Dr Alice Reichert from 10/19-- May forego endoluminal evaluation for the near future given lack of bleeding recurrence and persistence of cardiopulmonary issues. Patient is in agreement. --10/23-- d/w Dr Russo--no Rectal bleed. Ok to resume po eliquis   Large pericardial effusion s/p percardiocentesis Large Pleural effusion s/p thoracentesis Patient underwent a pericardiocentesis on 08/05/2021 with removal of 700 mL of pleural fluid.  Inflammatory markers were unremarkable.  Cardiology monitoring the patient while in the hospital.  Still on vasopressors due to hypotension. --repeat echo  10/21--no pericaridal effusion    Left pleural effusion secondary to acute exacerbation of chronic diastolic CHF -status post thoracocentesis with removal of 600 mL of fluid on 07/28/2021. --10/20-- torsemide 20 mg qd with IV albumin --10/21-- IV lqasix 40 mg bid per cardiology --10/22 cont IV lasix--good uop    Hypotension suspect Secondary to hemorrhagic shock and low intravsacular volume --Antihypertensives on hold.   --cont po  midodrine.   --IV Albumin       Acute kidney injury on CKD stage 3b --Patient's baseline creatinine around 1.4-1.7.  Currently at baseline.   --Nephrology followed the patient during hospitalization.  -- Benazepril and spironolactone on hold.   Permanent atrial fibrillation --Was previously on Eliquis.  Currently on hold due to GI bleed.  -- Rate controlled at this time--not on any po rate controlling meds --10/21--started on IV heparin gtt--monitor hgb  --10/22-- hgb remains stable --10/23-- discussed with G.I. Dr. Virgina Jock and cardiology Dr. Rockey Situ plan is to move forward with starting eliquis while patient is in-house to make sure she tolerates. DC heparin drip. Patient is in agreement with plan.  Type 2 diabetes mellitus Diet controlled at home.   --Continue sliding scale insulin while in the hospital.   Iron deficiency/Vitamin B12  --patient received Venofer 200 mg IV daily for 5 days.   -cont oral iron and vitamin B12 supplements.     DVT prophylaxis: IV heparin gtt   Code Status: Full code     Family Communication:  Spoke with the patient's husband at bedside.   Disposition Plan:     Likely home in 1-2 days   Consults called:  Gastroenterology, cardiology, nephrology, critical care   Admission status: Inpatient   Procedures:  Thoracocentesis on 07/28/2021 with removal of 600 mL fluid. Pericardiocentesis.  Level of care: Med-Surg Status is: Inpatient   TOTAL TIME TAKING CARE OF THIS PATIENT: 25 minutes.  >50% time spent on  counselling and coordination of care  Note: This dictation was prepared with Dragon dictation along with smaller phrase technology. Any transcriptional errors that result from this process are unintentional.  Fritzi Mandes M.D    [facility]   CC: Primary care physician; Valerie Roys, DO Patient ID: Karen Farley, female   DOB: [birth year], 75 y.o.   MRN: [id number]

## 2021-08-12 NOTE — Progress Notes (Signed)
Report given to receiving RN.  Pt transferred via bed.  All belongings sent with husband who is at bedside.  POC continued.  Transported via Therapist, sports

## 2021-08-12 NOTE — Progress Notes (Signed)
ANTICOAGULATION CONSULT NOTE  Pharmacy Consult for heparin Indication: atrial fibrillation  No Known Allergies  Patient Measurements: Height: 5\' 2"  (157.5 cm) Weight: 102.1 kg (225 lb 1.4 oz) IBW/kg (Calculated) : 50.1 Heparin Dosing Weight: 74 kg  Vital Signs: Temp: 97.8 F (36.6 C) (10/23 0400) Temp Source: Oral (10/23 0400) BP: 101/65 (10/23 0400) Pulse Rate: 72 (10/23 0500)  Labs: Recent Labs    08/10/21 0809 08/10/21 1412 08/10/21 1546 08/10/21 2149 08/11/21 0620 08/12/21 0534  HGB 9.7*  --   --   --  9.9* 9.6*  HCT 30.4*  --   --   --  31.3* 30.8*  PLT 196  --   --   --  214 229  APTT  --  200* 83* 93*  --   --   LABPROT  --  17.5*  --   --   --   --   INR  --  1.4*  --   --   --   --   HEPARINUNFRC  --   --   --  0.37 0.42 0.41  CREATININE 1.47*  --   --   --  1.34*  --      Estimated Creatinine Clearance: 40.6 mL/min (A) (by C-G formula based on SCr of 1.34 mg/dL (H)).   Medical History: Past Medical History:  Diagnosis Date   Chronic heart failure with preserved ejection fraction (HFpEF) ([place])    a. 11/2019 Echo: EF 60-65%, no rwma, Nl RV size/fxn. Mod dil LA.   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 Patient with pericardial effusion requiring drain placement. Suspect pericarditis complicated by hemorrhage in the setting of anticoagulation. Eliquis has been on hold. Drain has now been removed with repeat echo showing small effusion with no need for repeat tap or pericardial window. Cardiology has consulted for heparin with plan to repeat echo in 24-48 hours to  ensure effusion is not enlarging.  Date Time aPTT/HL Rate/Comment 1015 0800 --- / ---  Last reported dose of Eliquis --- 10/21 2149 93s / 0.37 Therapeutic x1; 1050 un/hr (correlating)  10/22 0620 0.42  Therax2, cont 1050 u/hr  10/23 0534 0.41  Thera x 3   Baseline Labs: aPTT - unk INR - 1.4 Hgb - 9.7 >  Plts - 196 >  Goal of Therapy:  Heparin level 0.3-0.7 units/ml aPTT 66-102 seconds Monitor platelets by anticoagulation protocol: Yes   Plan:  Heparin level therapeutic x 3 Continue current heparin infusion at 1050 units/hr check HL with am labs CTM CBC daily while on heparin.  Renda Rolls, PharmD, [facility] 08/12/2021 6:16 AM

## 2021-08-13 DIAGNOSIS — I4891 Unspecified atrial fibrillation: Secondary | ICD-10-CM | POA: Diagnosis not present

## 2021-08-13 DIAGNOSIS — E1122 Type 2 diabetes mellitus with diabetic chronic kidney disease: Secondary | ICD-10-CM | POA: Diagnosis not present

## 2021-08-13 DIAGNOSIS — I5033 Acute on chronic diastolic (congestive) heart failure: Secondary | ICD-10-CM | POA: Diagnosis not present

## 2021-08-13 DIAGNOSIS — E119 Type 2 diabetes mellitus without complications: Secondary | ICD-10-CM

## 2021-08-13 LAB — RENAL FUNCTION PANEL
Albumin: 3.9 g/dL (ref 3.5–5.0)
Anion gap: 12 (ref 5–15)
BUN: 34 mg/dL — ABNORMAL HIGH (ref 8–23)
CO2: 27 mmol/L (ref 22–32)
Calcium: 9.2 mg/dL (ref 8.9–10.3)
Chloride: 99 mmol/L (ref 98–111)
Creatinine, Ser: 1.51 mg/dL — ABNORMAL HIGH (ref 0.44–1.00)
GFR, Estimated: 36 mL/min — ABNORMAL LOW (ref >60)
Glucose, Bld: 140 mg/dL — ABNORMAL HIGH (ref 70–99)
Phosphorus: 2.3 mg/dL — ABNORMAL LOW (ref 2.5–4.6)
Potassium: 4.2 mmol/L (ref 3.5–5.1)
Sodium: 138 mmol/L (ref 135–145)

## 2021-08-13 LAB — GLUCOSE, CAPILLARY
Glucose-Capillary: 149 mg/dL — ABNORMAL HIGH (ref 70–99)
Glucose-Capillary: 145 mg/dL — ABNORMAL HIGH (ref 70–99)
Glucose-Capillary: 140 mg/dL — ABNORMAL HIGH (ref 70–99)
Glucose-Capillary: 244 mg/dL — ABNORMAL HIGH (ref 70–99)

## 2021-08-13 LAB — CBC
HCT: 32.3 % — ABNORMAL LOW (ref 36.0–46.0)
Hemoglobin: 9.8 g/dL — ABNORMAL LOW (ref 12.0–15.0)
MCH: 27.3 pg (ref 26.0–34.0)
MCHC: 30.3 g/dL (ref 30.0–36.0)
MCV: 90 fL (ref 80.0–100.0)
Platelets: 264 10*3/uL (ref 150–400)
RBC: 3.59 MIL/uL — ABNORMAL LOW (ref 3.87–5.11)
RDW: 26.2 % — ABNORMAL HIGH (ref 11.5–15.5)
WBC: 8 10*3/uL (ref 4.0–10.5)
nRBC: 0 % (ref 0.0–0.2)

## 2021-08-13 LAB — PHOSPHORUS: Phosphorus: 2.3 mg/dL — ABNORMAL LOW (ref 2.5–4.6)

## 2021-08-13 LAB — MAGNESIUM: Magnesium: 2.1 mg/dL (ref 1.7–2.4)

## 2021-08-13 MED ORDER — FUROSEMIDE 10 MG/ML IJ SOLN
8.0000 mg/h | INTRAVENOUS | Status: DC
  Administered 2021-08-13: 4 mg/h via INTRAVENOUS
  Administered 2021-08-15 – 2021-08-27 (×10): 8 mg/h via INTRAVENOUS
  Filled 2021-08-13 (×14): qty 20

## 2021-08-13 MED ORDER — SPIRONOLACTONE 25 MG PO TABS
25.0000 mg | ORAL_TABLET | Freq: Every day | ORAL | Status: DC
  Administered 2021-08-13 – 2021-08-28 (×15): 25 mg via ORAL
  Filled 2021-08-13 (×15): qty 1

## 2021-08-13 NOTE — Progress Notes (Signed)
[facility]  ROUNDING NOTE   Subjective:   [name] was admitted to [facility] on 07/26/2021 for Morbid obesity ([place]) [E66.01] DOE (dyspnea on exertion) [R06.09] Recurrent left pleural effusion [J90] Hypotension [I95.9] Type 2 diabetes mellitus without complication, without long-term current use of insulin ([place]) [E11.9] Atrial fibrillation, unspecified type ([place]) [I48.91] Chronic congestive heart failure, unspecified heart failure type [facility]) [I50.9]  Patient was last seen by my partner, Dr. Candiss Norse, on 05/31/21.  Patient seen sitting up in chair Alert and oriented Husband seated with patient Concerned about swelling in legs Improved erythema and blistering on bilateral lower legs  Objective:  Vital signs in last 24 hours:  Temp:  [97.5 F (36.4 C)-98.4 F (36.9 C)] 97.5 F (36.4 C) (10/24 1233) Pulse Rate:  [63-91] 86 (10/24 1233) Resp:  [16-19] 16 (10/24 1233) BP: (96-115)/(57-77) 112/77 (10/24 1233) SpO2:  [90 %-98 %] 98 % (10/24 1233)  Weight change:  Filed Weights   08/06/21 0253 08/11/21 0500 08/12/21 0500  Weight: 102 kg 102.1 kg 100 kg    Intake/Output: I/O last 3 completed shifts: In: 199.9 [I.V.:124.8; IV Piggyback:75.1] Out: 2750 [Urine:2750]   Intake/Output this shift:  Total I/O In: -  Out: 200 [Urine:200]  Physical Exam: General: NAD, sitting in chair  Head: Normocephalic, atraumatic. Moist oral mucosal membranes  Eyes: Anicteric  Lungs:  Clear bilaterally, normal effort  Heart: Irregular rhythm  Abdomen:  Soft, nontender  Extremities:  ++ peripheral edema.  Neurologic: Nonfocal, moving all four extremities  Skin: Mild erythema        Basic Metabolic Panel: Recent Labs  Lab 08/09/21 0532 08/10/21 0809 08/11/21 0620 08/12/21 0534 08/12/21 1308 08/13/21 0414 08/13/21 0417  NA 141 144 142  --  141  --  138  K 3.3* 3.3* 3.3*  --  4.7  --  4.2  CL 101 101 100  --  99  --  99  CO2 34* 32 35*  --  27  --  27  GLUCOSE  146* 151* 155*  --  155*  --  140*  BUN 35* 32* 34*  --  34*  --  34*  CREATININE 1.39* 1.47* 1.34*  --  1.43*  --  1.51*  CALCIUM 8.9 8.7* 8.8*  --  8.9  --  9.2  MG 2.0 1.9 2.0 2.0  --  2.1  --   PHOS 2.3* 2.0* 2.2* 2.4*  --  2.3* 2.3*     Liver Function Tests: Recent Labs  Lab 08/13/21 0417  ALBUMIN 3.9    No results for input(s): LIPASE, AMYLASE in the last 168 hours. No results for input(s): AMMONIA in the last 168 hours.  CBC: Recent Labs  Lab 08/09/21 0532 08/10/21 0809 08/11/21 0620 08/12/21 0534 08/13/21 0414  WBC 8.2 8.1 9.0 8.2 8.0  NEUTROABS 6.2 6.2 6.8  --   --   HGB 9.7* 9.7* 9.9* 9.6* 9.8*  HCT 30.9* 30.4* 31.3* 30.8* 32.3*  MCV 88.3 89.7 91.3 90.9 90.0  PLT 186 196 214 229 264     Cardiac Enzymes: No results for input(s): CKTOTAL, CKMB, CKMBINDEX, TROPONINI in the last 168 hours.  BNP: Invalid input(s): POCBNP  CBG: Recent Labs  Lab 08/12/21 1154 08/12/21 1635 08/12/21 2217 08/13/21 0745 08/13/21 1213  GLUCAP 131* 153* 118* 149* 145*     Microbiology: Results for orders placed or performed during the hospital encounter of 07/26/21  Resp Panel by RT-PCR (Flu A&B, Covid) Nasopharyngeal Swab  Status: None   Collection Time: 07/26/21  7:19 PM   Specimen: Nasopharyngeal Swab; Nasopharyngeal(NP) swabs in vial transport medium  Result Value Ref Range Status   SARS Coronavirus 2 by RT PCR NEGATIVE NEGATIVE Final    Comment: (NOTE) SARS-CoV-2 target nucleic acids are NOT DETECTED.  The SARS-CoV-2 RNA is generally detectable in upper respiratory specimens during the acute phase of infection. The lowest concentration of SARS-CoV-2 viral copies this assay can detect is 138 copies/mL. A negative result does not preclude SARS-Cov-2 infection and should not be used as the sole basis for treatment or other patient management decisions. A negative result may occur with  improper specimen collection/handling, submission of specimen other than  nasopharyngeal swab, presence of viral mutation(s) within the areas targeted by this assay, and inadequate number of viral copies(<138 copies/mL). A negative result must be combined with clinical observations, patient history, and epidemiological information. The expected result is Negative.  Fact Sheet for Patients:  EntrepreneurPulse.com.au  Fact Sheet for Healthcare Providers:  IncredibleEmployment.be  This test is no t yet approved or cleared by the [place] FDA and  has been authorized for detection and/or diagnosis of SARS-CoV-2 by FDA under an Emergency Use Authorization (EUA). This EUA will remain  in effect (meaning this test can be used) for the duration of the COVID-19 declaration under Section 564(b)(1) of the Act, 21 U.S.C.section 360bbb-3(b)(1), unless the authorization is terminated  or revoked sooner.       Influenza A by PCR NEGATIVE NEGATIVE Final   Influenza B by PCR NEGATIVE NEGATIVE Final    Comment: (NOTE) The Xpert Xpress SARS-CoV-2/FLU/RSV plus assay is intended as an aid in the diagnosis of influenza from Nasopharyngeal swab specimens and should not be used as a sole basis for treatment. Nasal washings and aspirates are unacceptable for Xpert Xpress SARS-CoV-2/FLU/RSV testing.  Fact Sheet for Patients: EntrepreneurPulse.com.au  Fact Sheet for Healthcare Providers: IncredibleEmployment.be  This test is not yet approved or cleared by the [place] FDA and has been authorized for detection and/or diagnosis of SARS-CoV-2 by FDA under an Emergency Use Authorization (EUA). This EUA will remain in effect (meaning this test can be used) for the duration of the COVID-19 declaration under Section 564(b)(1) of the Act, 21 U.S.C. section 360bbb-3(b)(1), unless the authorization is terminated or revoked.  Performed at [facility], [address] [place], [place]  [address]   Body fluid culture w Gram Stain     Status: None   Collection Time: 07/28/21  1:53 PM   Specimen: PATH Cytology Pleural fluid  Result Value Ref Range Status   Specimen Description   Final    PLEURAL Performed at [facility], [address] [place], [place] [address]    Special Requests   Final    NONE Performed at [facility], [address] [place], [place] [address]    Gram Stain   Final    NO SQUAMOUS EPITHELIAL CELLS SEEN FEW WBC SEEN NO ORGANISMS SEEN    Culture   Final    NO GROWTH 3 DAYS Performed at [facility], [place] [address] [place],  [address]    Report Status 08/01/2021 FINAL  Final  Body fluid culture w Gram Stain     Status: None   Collection Time: 08/01/21  3:36 PM   Specimen: PATH Cytology Peritoneal fluid  Result Value Ref Range Status   Specimen Description   Final    PERITONEAL Performed at [facility], [address], [place], [place] [address]    Special Requests   Final    NONE Performed at [facility], [place], [place] [address]    Gram Stain   Final    RARE WBC PRESENT,BOTH PMN AND MONONUCLEAR NO ORGANISMS SEEN    Culture   Final    NO GROWTH 3 DAYS Performed at [facility], [place] [address] [place], [place] [address]    Report Status 08/05/2021 FINAL  Final  MRSA Next Gen by PCR, Nasal     Status: None   Collection Time: 08/04/21  8:56 PM   Specimen: Nasal Mucosa; Nasal Swab  Result Value Ref Range Status   MRSA by PCR Next Gen NOT DETECTED NOT DETECTED Final    Comment: (NOTE) The GeneXpert MRSA Assay (FDA approved for NASAL specimens only), is one component of a comprehensive MRSA colonization surveillance program. It is not intended to diagnose MRSA infection nor to guide or monitor treatment for MRSA infections. Test performance is not FDA approved in patients less than 82 years old. Performed at [facility], [address]  [place], [place] [address]   Body fluid culture w Gram Stain     Status: None   Collection Time: 08/05/21  5:50 PM   Specimen: Pericardial  Result Value Ref Range Status   Specimen Description   Final    PERICARDIAL Performed at [facility], [address], [place], [place] [address]    Special Requests   Final    NONE Performed at [facility], [address] [place], [place] [address]    Gram Stain   Final    FEW WBC PRESENT, PREDOMINANTLY MONONUCLEAR NO ORGANISMS SEEN    Culture   Final    NO GROWTH 3 DAYS Performed at [facility], [place] [address], [place], [place] [address]    Report Status 08/09/2021 FINAL  Final    Coagulation Studies: Recent Labs    08/10/21 1412  LABPROT 17.5*  INR 1.4*     Urinalysis: No results for input(s): COLORURINE, LABSPEC, PHURINE, GLUCOSEU, HGBUR, BILIRUBINUR, KETONESUR, PROTEINUR, UROBILINOGEN, NITRITE, LEUKOCYTESUR in the last 72 hours.  Invalid input(s): APPERANCEUR    Imaging: No results found.   Medications:    sodium chloride Stopped (08/05/21 1441)   albumin human 12.5 g (08/13/21 0919)   furosemide (LASIX) 200 mg in dextrose 5% 100 mL (2mg /mL) infusion      apixaban  5 mg Oral BID   docusate sodium  100 mg Oral Daily   folic acid  1 mg Oral Daily   hydrocortisone   Rectal TID   insulin aspart  0-5 Units Subcutaneous QHS   insulin aspart  0-9 Units Subcutaneous TID WC   iron polysaccharides  150 mg Oral Daily   latanoprost  1 drop Both Eyes QHS   metoprolol tartrate  12.5 mg Oral Q6H   midodrine  10 mg Oral TID WC   pantoprazole (PROTONIX) IV  40 mg Intravenous Q12H   polyethylene glycol  17 g Oral Daily   Ensure Max Protein  11 oz Oral BID   spironolactone  25 mg Oral Daily   vitamin B-12  500 mcg Oral Daily     Assessment/ Plan:  Ms. BRITTENEY AYOTTE is a 75 y.o. white female with diastolic congestive heart failure, hypertension, hyperlipidemia, lymphedema, atrial fibrillation,  peripheral vascular disease who is admitted to [facility] on 07/26/2021 for Morbid obesity (  [place]) [E66.01] DOE (dyspnea on exertion) [R06.09] Recurrent left pleural effusion [J90] Hypotension [I95.9] Type 2 diabetes mellitus without complication, without long-term current use of insulin (HCC) [E11.9] Atrial fibrillation, unspecified type ([place]) [I48.91] Chronic congestive heart failure, unspecified heart failure type ([place]) [I50.9]  Acute kidney injury on chronic kidney disease stage IIIB: with baseline creatinine of 1.66, GFR of 36 on 07/13/21. History of bland urine. 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 Hold benazepril. Creatinine remains at baseline - Will resume Furosemide drip @4mg /hr. Continue Albumin  - Patient refuses wrapping legs at this time, due to discomfort. Encouraged elevation.  - Will monitor renal function  Hypotension: BP 112/77 Will monitor with resumed diuretic therapy. Continue Modidrine  Acute exacerbation of diastolic congestive heart failure:  - Cardiology following, Echo on 08/07/21 shows EF 55% -Fluid restriction reduced to 1266ml daily -Resumed Furosemide as above - Will order Spironolactone 25mg  daily  Hyponatremia: with hypervolumia.   -Resolved   5. Left pleural effusion: Thoracentesis on 07/28/21 with 665ml removed. Paracentesis on 08/01/21 with 1.2L removed. Large pleural effusion seen on echo.  Pulmonology following. No intervention scheduled at this time  6. Pericardial effusion- Pericardiocentesis performed on 08/05/21 with 1.1L removed.  Repeat echo on 08/10/21 shows a small pericardial effusion. Monitoring   LOS: [place] 10/24/202212:41 PM

## 2021-08-13 NOTE — Progress Notes (Signed)
PT Cancellation Note  Patient Details Name: Karen Farley MRN: [id number] DOB: 07-Sep-1946   Cancelled Treatment:    Reason Eval/Treat Not Completed: Other (comment).  Pt sitting in recliner upon PT arrival; visitor present.  Pt reports she has had an up and down type day and declining therapy at this time.  Pt reports feeling like she is urinating a lot (purewick in place already) and unable to participate in current state.  Will re-attempt PT session at a later date/time.  Leitha Bleak, PT 08/13/21, 11:56 AM

## 2021-08-13 NOTE — Progress Notes (Signed)
[facility] NAME: Karen Farley    MR#:  [id number]  DATE OF BIRTH:  [birth year]  SUBJECTIVE:   No complaints . No Rectal bleed, CP or SOB Good UOP REVIEW OF SYSTEMS:   Review of Systems  Constitutional:  Negative for chills, fever and weight loss.  HENT:  Negative for ear discharge, ear pain and nosebleeds.   Eyes:  Negative for blurred vision, pain and discharge.  Respiratory:  Negative for sputum production, shortness of breath, wheezing and stridor.   Cardiovascular:  Positive for leg swelling. Negative for chest pain, palpitations, orthopnea and PND.  Gastrointestinal:  Negative for abdominal pain, diarrhea, nausea and vomiting.  Genitourinary:  Negative for frequency and urgency.  Musculoskeletal:  Negative for back pain and joint pain.  Neurological:  Positive for weakness. Negative for sensory change, speech change and focal weakness.  Psychiatric/Behavioral:  Negative for depression and hallucinations. The patient is not nervous/anxious.   Tolerating Diet:yes Tolerating PT: HHPT  DRUG ALLERGIES:  No Known Allergies  VITALS:  Blood pressure 110/73, pulse 94, temperature (!) 97.5 F (36.4 C), temperature source Oral, resp. rate 16, height 5\' 2"  (1.575 m), weight 100 kg, SpO2 100 %.  PHYSICAL EXAMINATION:   Physical Exam  GENERAL:  75 y.o.-year-old patient lying in the bed with no acute distress. obese HEENT: Head atraumatic, normocephalic. Oropharynx and nasopharynx clear.  NECK:  Supple, no jugular venous distention. No thyroid enlargement, no tenderness.  LUNGS: decreased breath sounds bilaterally, no wheezing, rales, rhonchi. No use of accessory muscles of respiration.  CARDIOVASCULAR: S1, S2 normal. No murmurs, rubs, or gallops.  ABDOMEN: Soft, nontender, nondistended. Bowel sounds present. No organomegaly or mass.  EXTREMITIES: Bilateral edema ++ NEUROLOGIC: non-focal PSYCHIATRIC:  patient is alert and  oriented x 3.  SKIN: No obvious rash, lesion, or ulcer.   LABORATORY PANEL:  CBC Recent Labs  Lab 08/13/21 0414  WBC 8.0  HGB 9.8*  HCT 32.3*  PLT 264     Chemistries  Recent Labs  Lab 08/13/21 0414 08/13/21 0417  NA  --  138  K  --  4.2  CL  --  99  CO2  --  27  GLUCOSE  --  140*  BUN  --  34*  CREATININE  --  1.51*  CALCIUM  --  9.2  MG 2.1  --     Cardiac Enzymes No results for input(s): TROPONINI in the last 168 hours. RADIOLOGY:  No results found. ASSESSMENT AND PLAN:  Karen Farley is a 75 y.o. female with medical history significant for HFpEF, permanent atrial fibrillation on Eliquis, PAD, type 2 diabetes, CKD stage IIIb, lymphedema presented to hospital with increasing shortness of breath and edema.  Patient has been noticing increasing abdominal distention since her admission in August with severe lower extremity edema and dyspnea and exertion.  GI bleed with hemorrhagic shock. --Patient was noted to have significant drop in hemoglobin to 9.7 from 12.4 and was transferred to the ICU.   --GI Dr Alice Reichert from 10/19-- May forego endoluminal evaluation for the near future given lack of bleeding recurrence and persistence of cardiopulmonary issues. Patient is in agreement. --10/23-- d/w Dr Russo--no Rectal bleed. Ok to resume po eliquis   Large pericardial effusion s/p percardiocentesis Large Pleural effusion s/p thoracentesis Patient underwent a pericardiocentesis on 08/05/2021 with removal of 700 mL of pleural fluid.  Inflammatory markers were unremarkable.  Cardiology monitoring the patient while in the hospital.  Still on vasopressors due to hypotension. --repeat echo 10/21--no pericaridal effusion    Left pleural effusion secondary to acute exacerbation of chronic diastolic CHF -status post thoracocentesis with removal of 600 mL of fluid on 07/28/2021. --10/20-- torsemide 20 mg qd with IV albumin --10/21-- IV lqasix 40 mg bid per cardiology --10/22 cont  IV lasix--good uop --10/23--IV lasix --10/24-- Dr Juleen China wants to start IV lasxi gtt    Hypotension suspect Secondary to hemorrhagic shock and low intravsacular volume --Antihypertensives on hold.   --cont po  midodrine.   --IV Albumin       Acute kidney injury on CKD stage 3b --Patient's baseline creatinine around 1.4-1.7.  Currently at baseline.   --Nephrology followed the patient during hospitalization.  -- Benazepril and spironolactone on hold.   Permanent atrial fibrillation --Was previously on Eliquis.  Currently on hold due to GI bleed.  -- Rate controlled at this time--not on any po rate controlling meds --10/21--started on IV heparin gtt--monitor hgb  --10/22-- hgb remains stable --10/23-- discussed with G.I. Dr. Virgina Jock and cardiology Dr. Rockey Situ plan is to move forward with starting eliquis while patient is in-house to make sure she tolerates. DC heparin drip. Patient is in agreement with plan. -10/24--Hgb stable  Type 2 diabetes mellitus Diet controlled at home.   --Continue sliding scale insulin while in the hospital.   Iron deficiency/Vitamin B12  --patient received Venofer -cont oral iron and vitamin B12 supplements.     DVT prophylaxis: eliquis   Code Status: Full code     Family Communication:  Spoke with the patient's husband at bedside.   Disposition Plan:     Likely home in 1-2 days   Consults called:  Gastroenterology, cardiology, nephrology, critical care   Admission status: Inpatient   Procedures:  Thoracocentesis on 07/28/2021 with removal of 600 mL fluid. Pericardiocentesis.  Level of care: Progressive Cardiac Status is: Inpatient   TOTAL TIME TAKING CARE OF THIS PATIENT: 25 minutes.  >50% time spent on counselling and coordination of care  Note: This dictation was prepared with Dragon dictation along with smaller phrase technology. Any transcriptional errors that result from this process are unintentional.  Fritzi Mandes M.D    [facility]   CC: Primary care physician; Valerie Roys, DO Patient ID: Karen Farley, female   DOB: [birth year], 75 y.o.   MRN: [id number]

## 2021-08-13 NOTE — Progress Notes (Addendum)
Progress Note  Patient Name: Karen Farley Date of Encounter: 08/13/2021  [facility] Cardiologist: Kate Sable, MD   Subjective   UOP -1.8L overnight. Kidney function stable. Hgb 9.8. NO chest pain. Still volume up on exam. Feels abdomen is softer.  Inpatient Medications    Scheduled Meds:  apixaban  5 mg Oral BID   docusate sodium  100 mg Oral Daily   folic acid  1 mg Oral Daily   furosemide  40 mg Intravenous BID   hydrocortisone   Rectal TID   insulin aspart  0-5 Units Subcutaneous QHS   insulin aspart  0-9 Units Subcutaneous TID WC   iron polysaccharides  150 mg Oral Daily   latanoprost  1 drop Both Eyes QHS   metoprolol tartrate  12.5 mg Oral Q6H   midodrine  10 mg Oral TID WC   pantoprazole (PROTONIX) IV  40 mg Intravenous Q12H   polyethylene glycol  17 g Oral Daily   potassium chloride  40 mEq Oral Daily   Ensure Max Protein  11 oz Oral BID   vitamin B-12  500 mcg Oral Daily   Continuous Infusions:  sodium chloride Stopped (08/05/21 1441)   albumin human 12.5 g (08/13/21 0919)   PRN Meds: acetaminophen, bisacodyl, lidocaine HCl (PF), lip balm, traMADol   Vital Signs    Vitals:   08/12/21 1653 08/12/21 1927 08/13/21 0320 08/13/21 0748  BP: 115/66 (!) 105/59 (!) 96/57 107/67  Pulse: 91 63 85 71  Resp: 16 19 17 18   Temp: 97.8 F (36.6 C) 97.9 F (36.6 C) 98 F (36.7 C) 98.4 F (36.9 C)  TempSrc:   Oral Oral  SpO2: 98% 90% 95% 95%  Weight:      Height:        Intake/Output Summary (Last 24 hours) at 08/13/2021 1050 Last data filed at 08/13/2021 0206 Gross per 24 hour  Intake 74.94 ml  Output 1800 ml  Net -1725.06 ml   Last 3 Weights 08/12/2021 08/11/2021 08/06/2021  Weight (lbs) 220 lb 7.4 oz 225 lb 1.4 oz 224 lb 13.9 oz  Weight (kg) 100 kg 102.1 kg 102 kg      Telemetry    Afib HR 70-80s- Personally Reviewed  ECG    No new - Personally Reviewed  Physical Exam   GEN: No acute distress.   Neck: No JVD Cardiac: Irreg  Irreg, no murmurs, rubs, or gallops.  Respiratory: Clear to auscultation bilaterally. GI: Soft, nontender, non-distended  MS: anasarca Neuro:  Nonfocal  Psych: Normal affect   Labs    High Sensitivity Troponin:   Recent Labs  Lab 07/26/21 1708  TROPONINIHS 17     Chemistry Recent Labs  Lab 08/10/21 0809 08/11/21 0620 08/12/21 0534 08/12/21 1308 08/13/21 0414  NA 144 142  --  141  --   K 3.3* 3.3*  --  4.7  --   CL 101 100  --  99  --   CO2 32 35*  --  27  --   GLUCOSE 151* 155*  --  155*  --   BUN 32* 34*  --  34*  --   CREATININE 1.47* 1.34*  --  1.43*  --   CALCIUM 8.7* 8.8*  --  8.9  --   MG 1.9 2.0 2.0  --  2.1  GFRNONAA 37* 41*  --  38*  --   ANIONGAP 11 7  --  15  --     Lipids No  results for input(s): CHOL, TRIG, HDL, LABVLDL, LDLCALC, CHOLHDL in the last 168 hours.  Hematology Recent Labs  Lab 08/11/21 0620 08/12/21 0534 08/13/21 0414  WBC 9.0 8.2 8.0  RBC 3.43* 3.39* 3.59*  HGB 9.9* 9.6* 9.8*  HCT 31.3* 30.8* 32.3*  MCV 91.3 90.9 90.0  MCH 28.9 28.3 27.3  MCHC 31.6 31.2 30.3  RDW 26.1* 26.1* 26.2*  PLT 214 229 264   Thyroid No results for input(s): TSH, FREET4 in the last 168 hours.  BNPNo results for input(s): BNP, PROBNP in the last 168 hours.  DDimer No results for input(s): DDIMER in the last 168 hours.   Radiology    No results found.  Cardiac Studies   Echo limited 08/10/21 1. Left ventricular ejection fraction, by estimation, is 60 to 65%. The  left ventricle has normal function. There is mild left ventricular  hypertrophy.   2. Right ventricular systolic function is normal. The right ventricular  size is normal. There is severely elevated pulmonary artery systolic  pressure.   3. A small pericardial effusion is present. The pericardial effusion is  posterior to the left ventricle and localized near the right atrium.   4. The aortic valve has an indeterminant number of cusps. There is  moderate calcification of the aortic valve.  There is moderate thickening  of the aortic valve.   5. The inferior vena cava is dilated in size with <50% respiratory  variability, suggesting right atrial pressure of 15 mmHg.    07/2021 PERICARDIOCENTESIS   Conclusion  Successful pericardiocentesis with placement of 6 French pigtail catheter into pericardial space with removal of 700 cc of bloody pericardial fluid  Patient Profile     75 y.o. female with h/o HFpEF, HTN, CKD stage 3, permanent afib who presented with hypotension, GIB and large pericardial effusion   Assessment & Plan   Pericardial effusion, hemorrhagic - s/p pericardiocentesis 700cc fluid and drain placement with hemorrhagic effusion - s/p drain removal - Repeat echo showed no reaccumulation of fluid  HFpEF - echo this admissions showed LVEF 60-65% - IV lasix 40mg BID - Bps low on midodrine 10mg  TID - UOP -1.8L, net -7.5L -  Lopressor 12.5mg  Q6H - still volume up on exam, continue with diuresis - monitor kidney function, strict I/Os, daily weights  Permanent Afib - Eliquis 5mg  BID>>initially held for GIB, restarted 10/23 - Lopressor 12.5mg  Q6H for rate control.  - Also on midodrine 10mg  TID  GIB Hypotension/shock - shock suspected scondary to hemorrhagic shock and low intravascular volume - antihypertensives on hold - continue midodrine  AKI on CKD stage 3 - Scr baseline 1.4-1.7 - nephrology following    For questions or updates, please contact [facility] Please consult [email or web address] for contact info under        Signed, Chadric Kimberley Ninfa Meeker, PA-C  08/13/2021, 10:50 AM

## 2021-08-13 NOTE — TOC Progression Note (Signed)
Transition of Care [facility]) - Progression Note    Patient Details  Name: Karen Farley MRN: [id number] Date of Birth: [birth year]  Transition of Care [facility]) CM/SW [place], RN Phone Number: 08/13/2021, 2:12 PM  Clinical Narrative: Spoke with patient about discharge recommendation for HHPT/OT, patient declines says her husband will be there to assist as needed.      Expected Discharge Plan: Home/Self Care Barriers to Discharge: Continued Medical Work up  Expected Discharge Plan and Services Expected Discharge Plan: Home/Self Care       Living arrangements for the past 2 months: Single Family Home                                       Social Determinants of Health (SDOH) Interventions    Readmission Risk Interventions No flowsheet data found.

## 2021-08-13 NOTE — Progress Notes (Signed)
Report called to [place], RN on 2A.

## 2021-08-14 ENCOUNTER — Inpatient Hospital Stay: Payer: Medicare PPO

## 2021-08-14 DIAGNOSIS — I5031 Acute diastolic (congestive) heart failure: Secondary | ICD-10-CM | POA: Diagnosis not present

## 2021-08-14 DIAGNOSIS — E1122 Type 2 diabetes mellitus with diabetic chronic kidney disease: Secondary | ICD-10-CM | POA: Diagnosis not present

## 2021-08-14 DIAGNOSIS — I5033 Acute on chronic diastolic (congestive) heart failure: Secondary | ICD-10-CM | POA: Diagnosis not present

## 2021-08-14 DIAGNOSIS — E119 Type 2 diabetes mellitus without complications: Secondary | ICD-10-CM | POA: Diagnosis not present

## 2021-08-14 DIAGNOSIS — I4891 Unspecified atrial fibrillation: Secondary | ICD-10-CM | POA: Diagnosis not present

## 2021-08-14 LAB — MAGNESIUM: Magnesium: 2.2 mg/dL (ref 1.7–2.4)

## 2021-08-14 LAB — BASIC METABOLIC PANEL
Anion gap: 13 (ref 5–15)
BUN: 35 mg/dL — ABNORMAL HIGH (ref 8–23)
CO2: 27 mmol/L (ref 22–32)
Calcium: 9.3 mg/dL (ref 8.9–10.3)
Chloride: 99 mmol/L (ref 98–111)
Creatinine, Ser: 1.55 mg/dL — ABNORMAL HIGH (ref 0.44–1.00)
GFR, Estimated: 35 mL/min — ABNORMAL LOW (ref >60)
Glucose, Bld: 145 mg/dL — ABNORMAL HIGH (ref 70–99)
Potassium: 4 mmol/L (ref 3.5–5.1)
Sodium: 139 mmol/L (ref 135–145)

## 2021-08-14 LAB — CBC
HCT: 32.4 % — ABNORMAL LOW (ref 36.0–46.0)
Hemoglobin: 9.8 g/dL — ABNORMAL LOW (ref 12.0–15.0)
MCH: 27.7 pg (ref 26.0–34.0)
MCHC: 30.2 g/dL (ref 30.0–36.0)
MCV: 91.5 fL (ref 80.0–100.0)
Platelets: 272 10*3/uL (ref 150–400)
RBC: 3.54 MIL/uL — ABNORMAL LOW (ref 3.87–5.11)
RDW: 26.8 % — ABNORMAL HIGH (ref 11.5–15.5)
WBC: 8.6 10*3/uL (ref 4.0–10.5)
nRBC: 0 % (ref 0.0–0.2)

## 2021-08-14 LAB — GLUCOSE, CAPILLARY
Glucose-Capillary: 138 mg/dL — ABNORMAL HIGH (ref 70–99)
Glucose-Capillary: 190 mg/dL — ABNORMAL HIGH (ref 70–99)
Glucose-Capillary: 160 mg/dL — ABNORMAL HIGH (ref 70–99)
Glucose-Capillary: 138 mg/dL — ABNORMAL HIGH (ref 70–99)

## 2021-08-14 LAB — PHOSPHORUS: Phosphorus: 2.6 mg/dL (ref 2.5–4.6)

## 2021-08-14 MED ORDER — METOLAZONE 2.5 MG PO TABS
2.5000 mg | ORAL_TABLET | Freq: Every day | ORAL | Status: DC
  Administered 2021-08-14 – 2021-08-15 (×2): 2.5 mg via ORAL
  Filled 2021-08-14 (×3): qty 1

## 2021-08-14 NOTE — Progress Notes (Signed)
[facility]  ROUNDING NOTE   Subjective:   [name] was admitted to [facility] on 07/26/2021 for Morbid obesity ([place]) [E66.01] DOE (dyspnea on exertion) [R06.09] Recurrent left pleural effusion [J90] Hypotension [I95.9] Type 2 diabetes mellitus without complication, without long-term current use of insulin ([place]) [E11.9] Atrial fibrillation, unspecified type ([place]) [I48.91] Chronic congestive heart failure, unspecified heart failure type [facility]) [I50.9]  Patient was last seen by my partner, Dr. Candiss Farley, on 05/31/21.  Patient seen sitting up in chair Husband at bedside Recorded urine output of 1L in 24 hour Feels like it has improved some  Objective:  Vital signs in last 24 hours:  Temp:  [97.5 F (36.4 C)-98.3 F (36.8 C)] 97.9 F (36.6 C) (10/25 1124) Pulse Rate:  [74-98] 76 (10/25 1124) Resp:  [15-18] 18 (10/25 1124) BP: (92-116)/(40-77) 104/66 (10/25 1124) SpO2:  [87 %-100 %] 100 % (10/25 1124) Weight:  [102.9 kg] 102.9 kg (10/25 0446)  Weight change:  Filed Weights   08/11/21 0500 08/12/21 0500 08/14/21 0446  Weight: 102.1 kg 100 kg 102.9 kg    Intake/Output: I/O last 3 completed shifts: In: 815.8 [P.O.:600; I.V.:137.9; IV Piggyback:77.9] Out: 1300 [Urine:1300]   Intake/Output this shift:  Total I/O In: 240 [P.O.:240] Out: 100 [Urine:100]  Physical Exam: General: NAD, sitting in chair  Head: Normocephalic, atraumatic. Moist oral mucosal membranes  Eyes: Anicteric  Lungs:  Clear bilaterally, normal effort  Heart: Irregular rhythm  Abdomen:  Soft, nontender  Extremities:  ++ peripheral edema.  Neurologic: Nonfocal, moving all four extremities  Skin: Mild erythema        Basic Metabolic Panel: Recent Labs  Lab 08/10/21 0809 08/11/21 0620 08/12/21 0534 08/12/21 1308 08/13/21 0414 08/13/21 0417 08/14/21 0423  NA 144 142  --  141  --  138 139  K 3.3* 3.3*  --  4.7  --  4.2 4.0  CL 101 100  --  99  --  99 99  CO2 32 35*  --  27  --   27 27  GLUCOSE 151* 155*  --  155*  --  140* 145*  BUN 32* 34*  --  34*  --  34* 35*  CREATININE 1.47* 1.34*  --  1.43*  --  1.51* 1.55*  CALCIUM 8.7* 8.8*  --  8.9  --  9.2 9.3  MG 1.9 2.0 2.0  --  2.1  --  2.2  PHOS 2.0* 2.2* 2.4*  --  2.3* 2.3* 2.6     Liver Function Tests: Recent Labs  Lab 08/13/21 0417  ALBUMIN 3.9    No results for input(s): LIPASE, AMYLASE in the last 168 hours. No results for input(s): AMMONIA in the last 168 hours.  CBC: Recent Labs  Lab 08/09/21 0532 08/10/21 0809 08/11/21 0620 08/12/21 0534 08/13/21 0414 08/14/21 0423  WBC 8.2 8.1 9.0 8.2 8.0 8.6  NEUTROABS 6.2 6.2 6.8  --   --   --   HGB 9.7* 9.7* 9.9* 9.6* 9.8* 9.8*  HCT 30.9* 30.4* 31.3* 30.8* 32.3* 32.4*  MCV 88.3 89.7 91.3 90.9 90.0 91.5  PLT 186 196 214 229 264 272     Cardiac Enzymes: No results for input(s): CKTOTAL, CKMB, CKMBINDEX, TROPONINI in the last 168 hours.  BNP: Invalid input(s): POCBNP  CBG: Recent Labs  Lab 08/13/21 1213 08/13/21 1714 08/13/21 2044 08/14/21 0805 08/14/21 1121  GLUCAP 145* 140* 244* 138* 190*     Microbiology: Results for orders placed or performed during the hospital  encounter of 07/26/21  Resp Panel by RT-PCR (Flu A&B, Covid) Nasopharyngeal Swab     Status: None   Collection Time: 07/26/21  7:19 PM   Specimen: Nasopharyngeal Swab; Nasopharyngeal(NP) swabs in vial transport medium  Result Value Ref Range Status   SARS Coronavirus 2 by RT PCR NEGATIVE NEGATIVE Final    Comment: (NOTE) SARS-CoV-2 target nucleic acids are NOT DETECTED.  The SARS-CoV-2 RNA is generally detectable in upper respiratory specimens during the acute phase of infection. The lowest concentration of SARS-CoV-2 viral copies this assay can detect is 138 copies/mL. A negative result does not preclude SARS-Cov-2 infection and should not be used as the sole basis for treatment or other patient management decisions. A negative result may occur with  improper specimen  collection/handling, submission of specimen other than nasopharyngeal swab, presence of viral mutation(s) within the areas targeted by this assay, and inadequate number of viral copies(<138 copies/mL). A negative result must be combined with clinical observations, patient history, and epidemiological information. The expected result is Negative.  Fact Sheet for Patients:  EntrepreneurPulse.com.au  Fact Sheet for Healthcare Providers:  IncredibleEmployment.be  This test is no t yet approved or cleared by the [place] FDA and  has been authorized for detection and/or diagnosis of SARS-CoV-2 by FDA under an Emergency Use Authorization (EUA). This EUA will remain  in effect (meaning this test can be used) for the duration of the COVID-19 declaration under Section 564(b)(1) of the Act, 21 U.S.C.section 360bbb-3(b)(1), unless the authorization is terminated  or revoked sooner.       Influenza A by PCR NEGATIVE NEGATIVE Final   Influenza B by PCR NEGATIVE NEGATIVE Final    Comment: (NOTE) The Xpert Xpress SARS-CoV-2/FLU/RSV plus assay is intended as an aid in the diagnosis of influenza from Nasopharyngeal swab specimens and should not be used as a sole basis for treatment. Nasal washings and aspirates are unacceptable for Xpert Xpress SARS-CoV-2/FLU/RSV testing.  Fact Sheet for Patients: EntrepreneurPulse.com.au  Fact Sheet for Healthcare Providers: IncredibleEmployment.be  This test is not yet approved or cleared by the [place] FDA and has been authorized for detection and/or diagnosis of SARS-CoV-2 by FDA under an Emergency Use Authorization (EUA). This EUA will remain in effect (meaning this test can be used) for the duration of the COVID-19 declaration under Section 564(b)(1) of the Act, 21 U.S.C. section 360bbb-3(b)(1), unless the authorization is terminated or revoked.  Performed at [facility], [address] [place], [place] [address]   Body fluid culture w Gram Stain     Status: None   Collection Time: 07/28/21  1:53 PM   Specimen: PATH Cytology Pleural fluid  Result Value Ref Range Status   Specimen Description   Final    PLEURAL Performed at [facility], [address], [place], [place] [address]    Special Requests   Final    NONE Performed at [facility], [address] [place], [place] [address]    Gram Stain   Final    NO SQUAMOUS EPITHELIAL CELLS SEEN FEW WBC SEEN NO ORGANISMS SEEN    Culture   Final    NO GROWTH 3 DAYS Performed at [facility], [place] [address] [place],  [address]    Report Status 08/01/2021 FINAL  Final  Body fluid culture w Gram Stain     Status: None   Collection Time: 08/01/21  3:36 PM   Specimen: PATH Cytology Peritoneal fluid  Result Value Ref  Range Status   Specimen Description   Final    PERITONEAL Performed at [facility], [address] [place], [place] [address]    Special Requests   Final    NONE Performed at [facility], [place], [place] [address]    Gram Stain   Final    RARE WBC PRESENT,BOTH PMN AND MONONUCLEAR NO ORGANISMS SEEN    Culture   Final    NO GROWTH 3 DAYS Performed at [facility], [place] [address], [place], [place] [address]    Report Status 08/05/2021 FINAL  Final  MRSA Next Gen by PCR, Nasal     Status: None   Collection Time: 08/04/21  8:56 PM   Specimen: Nasal Mucosa; Nasal Swab  Result Value Ref Range Status   MRSA by PCR Next Gen NOT DETECTED NOT DETECTED Final    Comment: (NOTE) The GeneXpert MRSA Assay (FDA approved for NASAL specimens only), is one component of a comprehensive MRSA colonization surveillance program. It is not intended to diagnose MRSA infection nor to guide or monitor treatment for MRSA infections. Test performance is not FDA approved in patients less than 64  years old. Performed at [facility], [address] [place], [place] [address]   Body fluid culture w Gram Stain     Status: None   Collection Time: 08/05/21  5:50 PM   Specimen: Pericardial  Result Value Ref Range Status   Specimen Description   Final    PERICARDIAL Performed at [facility], [address] [place], [place] [address]    Special Requests   Final    NONE Performed at [facility], [address] [place], [place] [address]    Gram Stain   Final    FEW WBC PRESENT, PREDOMINANTLY MONONUCLEAR NO ORGANISMS SEEN    Culture   Final    NO GROWTH 3 DAYS Performed at [facility], [place] [address] [place], [place] [address]    Report Status 08/09/2021 FINAL  Final    Coagulation Studies: No results for input(s): LABPROT, INR in the last 72 hours.   Urinalysis: No results for input(s): COLORURINE, LABSPEC, PHURINE, GLUCOSEU, HGBUR, BILIRUBINUR, KETONESUR, PROTEINUR, UROBILINOGEN, NITRITE, LEUKOCYTESUR in the last 72 hours.  Invalid input(s): APPERANCEUR    Imaging: No results found.   Medications:    sodium chloride 250 mL (08/13/21 1947)   furosemide (LASIX) 200 mg in dextrose 5% 100 mL (2mg /mL) infusion 8 mg/hr (08/14/21 1118)    apixaban  5 mg Oral BID   docusate sodium  100 mg Oral Daily   folic acid  1 mg Oral Daily   hydrocortisone   Rectal TID   insulin aspart  0-5 Units Subcutaneous QHS   insulin aspart  0-9 Units Subcutaneous TID WC   iron polysaccharides  150 mg Oral Daily   latanoprost  1 drop Both Eyes QHS   metoprolol tartrate  12.5 mg Oral Q6H   midodrine  10 mg Oral TID WC   pantoprazole (PROTONIX) IV  40 mg Intravenous Q12H   polyethylene glycol  17 g Oral Daily   Ensure Max Protein  11 oz Oral BID   spironolactone  25 mg Oral Daily   vitamin B-12  500 mcg Oral Daily     Assessment/ Plan:  [name] is a 75 y.o. white female with diastolic congestive heart failure, hypertension,  hyperlipidemia, lymphedema, atrial fibrillation, peripheral vascular disease who is  admitted to [facility] on 07/26/2021 for Morbid obesity ([place]) [E66.01] DOE (dyspnea on exertion) [R06.09] Recurrent left pleural effusion [J90] Hypotension [I95.9] Type 2 diabetes mellitus without complication, without long-term current use of insulin ([facility]) [E11.9] Atrial fibrillation, unspecified type ([place]) [I48.91] Chronic congestive heart failure, unspecified heart failure type ([place]) [I50.9]  Acute kidney injury on chronic kidney disease stage IIIB: with baseline creatinine of 1.66, GFR of 36 on 07/13/21. History of bland urine. 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 Hold benazepril. Creatinine remains at baseline - Will increase to Furosemide drip 8mg /hr to optimize fluid removal. D/c Albumin - Continue to elevate legs while seated in the chair.  - Discussed incorporating Farxiga after discharge to manage fluid volume overload.  - Will monitor renal function  Hypotension: BP 104/66 Stable with resumed diuretic therapy. Continue Modidrine  Acute exacerbation of diastolic congestive heart failure:  - Cardiology following, Echo on 08/07/21 shows EF 55% -Fluid restriction 1245ml daily -Increased Furosemide as above - Continue Spironolactone 25mg  daily  Hyponatremia: with hypervolumia.   -Resolved   5. Left pleural effusion: Thoracentesis on 07/28/21 with 668ml removed. Paracentesis on 08/01/21 with 1.2L removed. Large pleural effusion seen on echo.  Pulmonology following. No intervention at this time  6. Pericardial effusion- Pericardiocentesis performed on 08/05/21 with 1.1L removed.  Repeat echo on 08/10/21 shows a small pericardial effusion. Cardiology following   LOS: [place] 10/25/202211:44 AM

## 2021-08-14 NOTE — Progress Notes (Signed)
Patient complained of dyspnea. Adjusted head of bed and checked pulse ox. Pulse ox reading is at 94% on room air.

## 2021-08-14 NOTE — Progress Notes (Signed)
Physical Therapy Treatment Patient Details Name: Karen Farley MRN: [id number] DOB: 08-16-1946 Today's Date: 08/14/2021   History of Present Illness 75 y/o female history HFpEF, Afib on Eliquis, PAD, DM, CKDIII, lymphedema, HTN admitted to hospital 10/6 with sob, orthopnea, weight gain with hypotension, large left pleural effusion, generalized anasarca on admission. S/p left thoracentesis 10/8 665ml removed and paracentesis 10/12 1.2L removed. Failed Lasix IV advanced to Lasix gtt; however, Lasix and Eliquis stopped 10/15 and patient transferred to ICU after had episode of GI bleed with drop in hgb, s/p 2U pRBC and vasopressor support initiated. Echo done 10/16 showed large pericardial effusion s/p pericardiocentesis 733mL fluid removed with drain placed.    PT Comments    Pt seen at lunch time as requested. Treatment session limited to B LE AROM for increased strength and fluid return. Pt unable to mobilize due to sensitive IV access site administering Lasix. Pt and husband educated on benefits of continuing HEP independently and progressing gait distance/frequency next visit. Pt agreed.    Recommendations for follow up therapy are one component of a multi-disciplinary discharge planning process, led by the attending physician.  Recommendations may be updated based on patient status, additional functional criteria and insurance authorization.  Follow Up Recommendations        Assistance Recommended at Discharge    Equipment Recommendations       Recommendations for Other Services       Precautions / Restrictions Precautions Precautions: Fall Restrictions Weight Bearing Restrictions: No     Mobility  Bed Mobility                    Transfers                        Ambulation/Gait                 Stairs             Wheelchair Mobility    Modified Rankin (Stroke Patients Only)       Balance                                             Cognition Arousal/Alertness: Awake/alert Behavior During Therapy: WFL for tasks assessed/performed Overall Cognitive Status: Within Functional Limits for tasks assessed                                          Exercises General Exercises - Lower Extremity Ankle Circles/Pumps: AROM;Strengthening;20 reps;Both;Seated Long Arc Quad: AROM;Strengthening;Both;20 reps;Seated Hip ABduction/ADduction: AROM;Strengthening;Both;20 reps Hip Flexion/Marching: AROM;Strengthening;Both;20 reps;Seated    General Comments General comments (skin integrity, edema, etc.): Pt receiving IV Lasix and unable to mobilize due to sensitive IV site stopping with minimal movement.      Pertinent Vitals/Pain Pain Assessment: No/denies pain    Home Living                          Prior Function            PT Goals (current goals can now be found in the care plan section) Acute Rehab PT Goals Patient Stated Goal: to regain independence    Frequency    Min 2X/week  PT Plan Current plan remains appropriate    Co-evaluation              AM-PAC PT "6 Clicks" Mobility   Outcome Measure  Help needed turning from your back to your side while in a flat bed without using bedrails?: A Little Help needed moving from lying on your back to sitting on the side of a flat bed without using bedrails?: A Little Help needed moving to and from a bed to a chair (including a wheelchair)?: A Little Help needed standing up from a chair using your arms (e.g., wheelchair or bedside chair)?: A Little Help needed to walk in hospital room?: A Little Help needed climbing 3-5 steps with a railing? : A Lot 6 Click Score: 17    End of Session   Activity Tolerance: Patient tolerated treatment well (Limited due to IV meds) Patient left: in chair;with call bell/phone within reach;with family/visitor present Nurse Communication: Mobility status PT Visit Diagnosis:  Muscle weakness (generalized) (M62.81);Difficulty in walking, not elsewhere classified (R26.2);Unsteadiness on feet (R26.81)     Time: 8871-9597 PT Time Calculation (min) (ACUTE ONLY): 12 min  Charges:  $Therapeutic Exercise: 8-22 mins                    Mikel Cella, PTA   [name] 08/14/2021, 2:07 PM

## 2021-08-14 NOTE — Progress Notes (Addendum)
Progress Note  Patient Name: Karen Farley Date of Encounter: 08/14/2021  [facility] Cardiologist: Kate Sable, MD   Subjective   UOP 1L, although patient feels this is not accurate. Bps mildly improved. Scr relatively stable. Patient is still volume overloaded. She had some shortness of breath last night, difficulty laying down.  Inpatient Medications    Scheduled Meds:  apixaban  5 mg Oral BID   docusate sodium  100 mg Oral Daily   folic acid  1 mg Oral Daily   hydrocortisone   Rectal TID   insulin aspart  0-5 Units Subcutaneous QHS   insulin aspart  0-9 Units Subcutaneous TID WC   iron polysaccharides  150 mg Oral Daily   latanoprost  1 drop Both Eyes QHS   metoprolol tartrate  12.5 mg Oral Q6H   midodrine  10 mg Oral TID WC   pantoprazole (PROTONIX) IV  40 mg Intravenous Q12H   polyethylene glycol  17 g Oral Daily   Ensure Max Protein  11 oz Oral BID   spironolactone  25 mg Oral Daily   vitamin B-12  500 mcg Oral Daily   Continuous Infusions:  sodium chloride 250 mL (08/13/21 1947)   albumin human 12.5 g (08/13/21 1836)   furosemide (LASIX) 200 mg in dextrose 5% 100 mL (2mg /mL) infusion 4 mg/hr (08/13/21 1800)   PRN Meds: acetaminophen, bisacodyl, lidocaine HCl (PF), lip balm, traMADol   Vital Signs    Vitals:   08/14/21 0146 08/14/21 0439 08/14/21 0446 08/14/21 0746  BP: (!) 103/40 92/63  104/73  Pulse: 80 74  86  Resp: 18 18  15   Temp: 98.2 F (36.8 C) 97.8 F (36.6 C)  97.8 F (36.6 C)  TempSrc: Oral     SpO2: 96% 100%  100%  Weight:   102.9 kg   Height:        Intake/Output Summary (Last 24 hours) at 08/14/2021 0918 Last data filed at 08/14/2021 7628 Gross per 24 hour  Intake 778.61 ml  Output 1150 ml  Net -371.39 ml   Last 3 Weights 08/14/2021 08/12/2021 08/11/2021  Weight (lbs) 226 lb 12.8 oz 220 lb 7.4 oz 225 lb 1.4 oz  Weight (kg) 102.876 kg 100 kg 102.1 kg      Telemetry    NSR, 70-80bpms, possible SVT with heart  rates in the 150s - Personally Reviewed  ECG    NO new - Personally Reviewed  Physical Exam   GEN: No acute distress.   Neck: No JVD Cardiac: RRR, no murmurs, rubs, or gallops.  Respiratory: diminished bilaterally, R>L GI: Soft, nontender, non-distended  MS: anasarca  Neuro:  Nonfocal  Psych: Normal affect   Labs    High Sensitivity Troponin:   Recent Labs  Lab 07/26/21 1708  TROPONINIHS 17     Chemistry Recent Labs  Lab 08/11/21 0620 08/12/21 0534 08/12/21 1308 08/13/21 0414 08/13/21 0417 08/14/21 0423  NA 142  --  141  --  138  --   K 3.3*  --  4.7  --  4.2  --   CL 100  --  99  --  99  --   CO2 35*  --  27  --  27  --   GLUCOSE 155*  --  155*  --  140*  --   BUN 34*  --  34*  --  34*  --   CREATININE 1.34*  --  1.43*  --  1.51*  --  CALCIUM 8.8*  --  8.9  --  9.2  --   MG 2.0 2.0  --  2.1  --  2.2  ALBUMIN  --   --   --   --  3.9  --   GFRNONAA 41*  --  38*  --  36*  --   ANIONGAP 7  --  15  --  12  --     Lipids No results for input(s): CHOL, TRIG, HDL, LABVLDL, LDLCALC, CHOLHDL in the last 168 hours.  Hematology Recent Labs  Lab 08/12/21 0534 08/13/21 0414 08/14/21 0423  WBC 8.2 8.0 8.6  RBC 3.39* 3.59* 3.54*  HGB 9.6* 9.8* 9.8*  HCT 30.8* 32.3* 32.4*  MCV 90.9 90.0 91.5  MCH 28.3 27.3 27.7  MCHC 31.2 30.3 30.2  RDW 26.1* 26.2* 26.8*  PLT 229 264 272   Thyroid No results for input(s): TSH, FREET4 in the last 168 hours.  BNPNo results for input(s): BNP, PROBNP in the last 168 hours.  DDimer No results for input(s): DDIMER in the last 168 hours.   Radiology    No results found.  Cardiac Studies   Echo limited 08/10/21 1. Left ventricular ejection fraction, by estimation, is 60 to 65%. The  left ventricle has normal function. There is mild left ventricular  hypertrophy.   2. Right ventricular systolic function is normal. The right ventricular  size is normal. There is severely elevated pulmonary artery systolic  pressure.   3. A  small pericardial effusion is present. The pericardial effusion is  posterior to the left ventricle and localized near the right atrium.   4. The aortic valve has an indeterminant number of cusps. There is  moderate calcification of the aortic valve. There is moderate thickening  of the aortic valve.   5. The inferior vena cava is dilated in size with <50% respiratory  variability, suggesting right atrial pressure of 15 mmHg.     07/2021 PERICARDIOCENTESIS    Conclusion   Successful pericardiocentesis with placement of 6 French pigtail catheter into pericardial space with removal of 700 cc of bloody pericardial fluid   Patient Profile     75 y.o. female  with h/o HFpEF, HTN, CKD stage 3, permanent afib who presented with hypotension, GIB and large pericardial effusion   Assessment & Plan      Pericardial effusion, hemorrhagic - s/p pericardiocentesis 700cc fluid and drain placement with hemorrhagic effusion - s/p drain removal - Repeat echo showed no reaccumulation of fluid   HFpEF Pericardial effusion - echo this admissions showed LVEF 60-65% - IV lasix >>transitioned to lasix drip 4mg /hr -  albumin ordered for level of 3.2 - UOP -1L, net -7.5L, UOP may be inaccurate - Lopressor 12.5mg  Q6H - spironolactone 25mg  daily per nephrology - Bps low, on midodrine 10mg  TID - still volume up on exam, continue with diuresis, may need metolazone given low UOP - monitor kidney function, strict I/Os, daily weights   Permanent Afib - Eliquis 5mg  BID>>initially held for GIB, restarted 10/23 - Lopressor 12.5mg  Q6H for rate control.  - Also on midodrine 10mg  TID   GIB Hypotension/shock - shock suspected scondary to hemorrhagic shock and low intravascular volume - continue midodrine - Hgb 9.8   AKI on CKD stage 3 - Scr baseline 1.4-1.7 - Creatinine stable - nephrology following - started on IV lasix drip and spiro - also on albumin    For questions or updates, please contact  [facility] Please consult [email or web address] for  contact info under        Signed, Tesla Bochicchio Ninfa Meeker, PA-C  08/14/2021, 9:18 AM

## 2021-08-14 NOTE — Progress Notes (Signed)
Karen Farley at [place] NAME: Karen Farley    MR#:  [id number]  DATE OF BIRTH:  Oct 28, 1945  SUBJECTIVE:  patient now started on Lasix drip's per Dr. Juleen China. She is out in the chair. Diuresis well. No complaints . No Rectal bleed, CP or SOB husband at bedside REVIEW OF SYSTEMS:   Review of Systems  Constitutional:  Negative for chills, fever and weight loss.  HENT:  Negative for ear discharge, ear pain and nosebleeds.   Eyes:  Negative for blurred vision, pain and discharge.  Respiratory:  Negative for sputum production, shortness of breath, wheezing and stridor.   Cardiovascular:  Positive for leg swelling. Negative for chest pain, palpitations, orthopnea and PND.  Gastrointestinal:  Negative for abdominal pain, diarrhea, nausea and vomiting.  Genitourinary:  Negative for frequency and urgency.  Musculoskeletal:  Negative for back pain and joint pain.  Neurological:  Positive for weakness. Negative for sensory change, speech change and focal weakness.  Psychiatric/Behavioral:  Negative for depression and hallucinations. The patient is not nervous/anxious.   Tolerating Diet:yes Tolerating PT: HHPT  DRUG ALLERGIES:  No Known Allergies  VITALS:  Blood pressure 104/66, pulse 76, temperature 97.9 F (36.6 C), temperature source Oral, resp. rate 18, height 5\' 2"  (1.575 m), weight 102.9 kg, SpO2 100 %.  PHYSICAL EXAMINATION:   Physical Exam  GENERAL:  75 y.o.-year-old patient lying in the bed with no acute distress. obese HEENT: Head atraumatic, normocephalic. Oropharynx and nasopharynx clear.  NECK:  Supple, no jugular venous distention. No thyroid enlargement, no tenderness.  LUNGS: decreased breath sounds bilaterally, no wheezing, rales, rhonchi. No use of accessory muscles of respiration.  CARDIOVASCULAR: S1, S2 normal. No murmurs, rubs, or gallops.  ABDOMEN: Soft, nontender, nondistended. Bowel sounds present. No organomegaly or  mass.  EXTREMITIES: Bilateral edema ++ NEUROLOGIC: non-focal PSYCHIATRIC:  patient is alert and oriented x 3.  SKIN: No obvious rash, lesion, or ulcer.   LABORATORY PANEL:  CBC Recent Labs  Lab 08/14/21 0423  WBC 8.6  HGB 9.8*  HCT 32.4*  PLT 272     Chemistries  Recent Labs  Lab 08/14/21 0423  NA 139  K 4.0  CL 99  CO2 27  GLUCOSE 145*  BUN 35*  CREATININE 1.55*  CALCIUM 9.3  MG 2.2    Cardiac Enzymes No results for input(s): TROPONINI in the last 168 hours. RADIOLOGY:  No results found. ASSESSMENT AND PLAN:  Karen Farley is a 75 y.o. female with medical history significant for HFpEF, permanent atrial fibrillation on Eliquis, PAD, type 2 diabetes, CKD stage IIIb, lymphedema presented to hospital with increasing shortness of breath and edema.  Patient has been noticing increasing abdominal distention since her admission in August with severe lower extremity edema and dyspnea and exertion.  GI bleed with hemorrhagic shock. --Patient was noted to have significant drop in hemoglobin to 9.7 from 12.4 and was transferred to the ICU.   --GI Dr Alice Reichert from 10/19-- May forego endoluminal evaluation for the near future given lack of bleeding recurrence and persistence of cardiopulmonary issues. Patient is in agreement. --10/23-- d/w Dr Russo--no Rectal bleed. Ok to resume po eliquis   Large pericardial effusion s/p percardiocentesis Large Pleural effusion s/p thoracentesis Patient underwent a pericardiocentesis on 08/05/2021 with removal of 700 mL of pleural fluid.  Inflammatory markers were unremarkable.  Cardiology monitoring the patient while in the hospital.  Still on vasopressors due to hypotension. --repeat echo 10/21--no pericaridal effusion  Left pleural effusion secondary to acute exacerbation of chronic diastolic CHF Severe Anasarca -status post thoracocentesis with removal of 600 mL of fluid on 07/28/2021. --10/20-- torsemide 20 mg qd with IV  albumin --10/21-- IV lqasix 40 mg bid per cardiology --10/22 cont IV lasix--good uop --10/23--IV lasix --10/24-- Dr Juleen China wants to start IV lasxi gtt --10/25-cont Lasix gtt    Hypotension suspect Secondary to hemorrhagic shock and low intravsacular volume --Antihypertensives on hold.   --cont po  midodrine.   --IV Albumin       Acute kidney injury on CKD stage 3b --Patient's baseline creatinine around 1.4-1.7.  Currently at baseline.   --Nephrology followed the patient during hospitalization.  -- Benazepril and spironolactone on hold.   Permanent atrial fibrillation --Was previously on Eliquis.  Currently on hold due to GI bleed.  -- Rate controlled at this time--not on any po rate controlling meds --10/21--started on IV heparin gtt--monitor hgb  --10/22-- hgb remains stable --10/23-- discussed with G.I. Dr. Virgina Jock and cardiology Dr. Rockey Situ plan is to move forward with starting eliquis while patient is in-house to make sure she tolerates. DC heparin drip. Patient is in agreement with plan. -10/24--Hgb stable  Type 2 diabetes mellitus Diet controlled at home.   --Continue sliding scale insulin while in the hospital.   Iron deficiency/Vitamin B12  --patient received Venofer -cont oral iron and vitamin B12 supplements.     DVT prophylaxis: eliquis   Code Status: Full code     Family Communication:  Spoke with the patient's husband at bedside.   Disposition Plan:     Likely home in 1-2 days   Consults called:  Gastroenterology, cardiology, nephrology, critical care   Admission status: Inpatient   Procedures:  Thoracocentesis on 07/28/2021 with removal of 600 mL fluid. Pericardiocentesis.  Level of care: Progressive Cardiac Status is: Inpatient  patient on IV Lasix drip. Discharge plan will depend on need for IV Lasix drip. Discuss with Dr. Juleen China and [facility] cardiology regarding discharge plan.   TOTAL TIME TAKING CARE OF THIS PATIENT: 25 minutes.  >50% time  spent on counselling and coordination of care  Note: This dictation was prepared with Dragon dictation along with smaller phrase technology. Any transcriptional errors that result from this process are unintentional.  Fritzi Mandes M.D    [facility]   CC: Primary care physician; Valerie Roys, DO Patient ID: [name], female   DOB: January 08, 1946, 75 y.o.   MRN: [id number]

## 2021-08-14 NOTE — Progress Notes (Signed)
Patient declined to have wounds on legs wrapped.

## 2021-08-15 ENCOUNTER — Inpatient Hospital Stay (HOSPITAL_COMMUNITY)
Admit: 2021-08-15 | Discharge: 2021-08-15 | Disposition: A | Discharge status: Home / Self Care | Payer: Medicare PPO | Attending: Internal Medicine | Admitting: Internal Medicine

## 2021-08-15 DIAGNOSIS — I482 Chronic atrial fibrillation, unspecified: Secondary | ICD-10-CM | POA: Diagnosis not present

## 2021-08-15 DIAGNOSIS — I5031 Acute diastolic (congestive) heart failure: Secondary | ICD-10-CM

## 2021-08-15 DIAGNOSIS — I3139 Other pericardial effusion (noninflammatory): Secondary | ICD-10-CM | POA: Diagnosis not present

## 2021-08-15 LAB — ECHOCARDIOGRAM LIMITED
AR max vel: 0.73 cm2
AV Area VTI: 0.67 cm2
AV Area mean vel: 0.69 cm2
AV Mean grad: 10.5 mmHg
AV Peak grad: 19.4 mmHg
Ao pk vel: 2.2 m/s
Height: 62 in
S' Lateral: 4.1 cm
Weight: 3598.4 oz

## 2021-08-15 LAB — CBC
HCT: 33.1 % — ABNORMAL LOW (ref 36.0–46.0)
Hemoglobin: 10.4 g/dL — ABNORMAL LOW (ref 12.0–15.0)
MCH: 28.1 pg (ref 26.0–34.0)
MCHC: 31.4 g/dL (ref 30.0–36.0)
MCV: 89.5 fL (ref 80.0–100.0)
Platelets: 287 10*3/uL (ref 150–400)
RBC: 3.7 MIL/uL — ABNORMAL LOW (ref 3.87–5.11)
RDW: 26.4 % — ABNORMAL HIGH (ref 11.5–15.5)
WBC: 6.6 10*3/uL (ref 4.0–10.5)
nRBC: 0 % (ref 0.0–0.2)

## 2021-08-15 LAB — BASIC METABOLIC PANEL
Anion gap: 9 (ref 5–15)
BUN: 33 mg/dL — ABNORMAL HIGH (ref 8–23)
CO2: 30 mmol/L (ref 22–32)
Calcium: 9 mg/dL (ref 8.9–10.3)
Chloride: 96 mmol/L — ABNORMAL LOW (ref 98–111)
Creatinine, Ser: 1.79 mg/dL — ABNORMAL HIGH (ref 0.44–1.00)
GFR, Estimated: 29 mL/min — ABNORMAL LOW (ref >60)
Glucose, Bld: 145 mg/dL — ABNORMAL HIGH (ref 70–99)
Potassium: 3.3 mmol/L — ABNORMAL LOW (ref 3.5–5.1)
Sodium: 135 mmol/L (ref 135–145)

## 2021-08-15 LAB — COMPREHENSIVE METABOLIC PANEL
ALT: 17 U/L (ref 0–44)
AST: 27 U/L (ref 15–41)
Albumin: 4 g/dL (ref 3.5–5.0)
Alkaline Phosphatase: 73 U/L (ref 38–126)
Anion gap: 12 (ref 5–15)
BUN: 31 mg/dL — ABNORMAL HIGH (ref 8–23)
CO2: 28 mmol/L (ref 22–32)
Calcium: 9.2 mg/dL (ref 8.9–10.3)
Chloride: 95 mmol/L — ABNORMAL LOW (ref 98–111)
Creatinine, Ser: 1.72 mg/dL — ABNORMAL HIGH (ref 0.44–1.00)
GFR, Estimated: 31 mL/min — ABNORMAL LOW (ref >60)
Glucose, Bld: 159 mg/dL — ABNORMAL HIGH (ref 70–99)
Potassium: 3.1 mmol/L — ABNORMAL LOW (ref 3.5–5.1)
Sodium: 135 mmol/L (ref 135–145)
Total Bilirubin: 2.7 mg/dL — ABNORMAL HIGH (ref 0.3–1.2)
Total Protein: 6.5 g/dL (ref 6.5–8.1)

## 2021-08-15 LAB — MAGNESIUM
Magnesium: 2.2 mg/dL (ref 1.7–2.4)
Magnesium: 2.1 mg/dL (ref 1.7–2.4)

## 2021-08-15 LAB — GLUCOSE, CAPILLARY
Glucose-Capillary: 118 mg/dL — ABNORMAL HIGH (ref 70–99)
Glucose-Capillary: 151 mg/dL — ABNORMAL HIGH (ref 70–99)
Glucose-Capillary: 174 mg/dL — ABNORMAL HIGH (ref 70–99)
Glucose-Capillary: 158 mg/dL — ABNORMAL HIGH (ref 70–99)

## 2021-08-15 LAB — PHOSPHORUS: Phosphorus: 2.7 mg/dL (ref 2.5–4.6)

## 2021-08-15 MED ORDER — POTASSIUM CHLORIDE 10 MEQ/100ML IV SOLN
10.0000 meq | INTRAVENOUS | Status: AC
Start: 2021-08-15 — End: 2021-08-15
  Administered 2021-08-15: 10 meq via INTRAVENOUS
  Filled 2021-08-15 (×6): qty 100

## 2021-08-15 NOTE — Progress Notes (Signed)
PROGRESS NOTE    Karen Farley  DJM:[id number] DOB: [birth year] DOA: 07/26/2021 PCP: Karen Roys, DO   Brief Narrative:  75 y.o. WF PMHx HFpEF, permanent atrial fibrillation on Eliquis, PAD, type 2 diabetes, CKD stage IIIb, lymphedema   Presented to hospital with increasing shortness of breath and edema.  Patient has been noticing increasing abdominal distention since her admission in August with severe lower extremity edema and dyspnea and exertion.   Subjective: A/O x4, does not have a base weight.  However states when she was discharged on 27 May 2021, weighed 218 pounds (98.8 kg).  Per patient and husband chronic bilateral pedal edema but not this severe.   Assessment & Plan:  Covid vaccination; vaccinated 3/3  Principal Problem:   Acute blood loss anemia Active Problems:   Type 2 diabetes mellitus with renal complication (HCC)   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 diabetes mellitus (HCC)   Atrial fibrillation (HCC)   Acute heart failure with preserved ejection fraction (HFpEF) (HCC)   Pleural effusion   Acute on chronic diastolic CHF (congestive heart failure) (HCC)   Chronic anticoagulation   Shock circulatory (HCC)   Pericardial effusion  GI bleed with hemorrhagic shock. --Patient was noted to have significant drop in hemoglobin to 9.7 from 12.4 and was transferred to the ICU.   --GI Dr Karen Farley from 10/19-- May forego endoluminal evaluation for the near future given lack of bleeding recurrence and persistence of cardiopulmonary issues. Patient is in agreement. --10/23-- d/w Dr Russo--no Rectal bleed. Ok to resume po eliquis   Large pericardial effusion  -S/p percardiocentesis  Large Pleural effusion s/p thoracentesis Patient underwent a pericardiocentesis on 08/05/2021 with removal of 700 mL of pleural fluid.  Inflammatory markers were unremarkable.  Cardiology monitoring the patient while in the hospital.  Still on vasopressors due to hypotension. --repeat echo  10/21--no pericaridal effusion    Left pleural effusion secondary to acute exacerbation of chronic diastolic CHF Severe Anasarca -status post thoracocentesis with removal of 600 mL of fluid on 07/28/2021. --10/20-- torsemide 20 mg qd with IV albumin --10/21-- IV lqasix 40 mg bid per cardiology --10/22 cont IV lasix--good uop --10/23--IV lasix --10/24-- Dr Karen Farley wants to start IV lasxi gtt --10/25-cont Lasix gtt     Hypotension suspect Secondary to hemorrhagic shock and low intravsacular volume --Antihypertensives on hold.   --cont po  midodrine.   --IV Albumin       Acute kidney injury on CKD stage 3b --Patient's baseline creatinine around 1.4-1.7.  Currently at baseline.   --Nephrology followed the patient during hospitalization.  -- Benazepril and spironolactone on hold.   Permanent atrial fibrillation --Was previously on Eliquis.  Currently on hold due to GI bleed.  -- Rate controlled at this time--not on any po rate controlling meds --10/21--started on IV heparin gtt--monitor hgb  --10/22-- hgb remains stable --10/23-- discussed with G.I. Dr. Virgina Farley and cardiology Dr. Rockey Farley plan is to move forward with starting eliquis while patient is in-house to make sure she tolerates. DC heparin drip. Patient is in agreement with plan. -10/24--Hgb stable   Type 2 diabetes mellitus Diet controlled at home.   --Continue sliding scale insulin while in the hospital.   Iron deficiency/Vitamin B12  --patient received Venofer -cont oral iron and vitamin B12 supplements.  Hypokalemia -Potassium goal> 4 - 10/26 potassium IV 50 mEq  Morbidly Obese  Body mass index is 41.13 kg/m.     DVT prophylaxis: Eliquis (held for thoracentesis) Code Status: Full Family  Communication: 10/26 husband at bedside for discussion plan of care all questions answered Status is: Inpatient    Dispo: The patient is from: Home              Anticipated d/c is to: Home              Anticipated d/c date  is: > 3 days              Patient currently is not medically stable to d/c.      Consultants:  Nephrology IR    Procedures/Significant Events:  10/8 s/p left thoracentesis: 634ml aspirated 10/12 paracentesis 1.2L aspirated .  10/16 Echocardiogram performed: Pericardiocentesis 771ml aspirated and drain placed  Failed Lasix IV advanced to Lasix gtt; however, Lasix and Eliquis stopped 10/15 and patient transferred to ICU after had episode of GI bleed with drop in hgb, s/p 2U pRBC and vasopressor support initiated.    10/21 Echocardiogram Left Ventricle: Left ventricular ejection fraction, by estimation, is 60  to 65%. The left ventricle has normal function. There is mild left  ventricular hypertrophy.   Right Ventricle: The right ventricular size is normal. No increase in  right ventricular wall thickness. Right ventricular systolic function is  normal. There is severely elevated pulmonary artery systolic pressure. The  tricuspid regurgitant velocity is  3.42 m/s, and with an assumed right atrial pressure of 15 mmHg, the  estimated right ventricular systolic pressure is 44.0 mmHg.   Pericardium: A small pericardial effusion is present. The pericardial  effusion is posterior to the left ventricle and localized near the right  atrium. Presence of pericardial fat pad.   Mitral Valve: There is mild thickening of the mitral valve leaflet(s).  Mild mitral annular calcification. MV peak gradient, 15.1 mmHg. The mean  mitral valve gradient is 7.0 mmHg.   Tricuspid Valve: The tricuspid valve is not well visualized.   Aortic Valve: The aortic valve has an indeterminant number of cusps. There  is moderate calcification of the aortic valve. There is moderate  thickening of the aortic valve.   Venous: The inferior vena cava is dilated in size with less than 50%  respiratory variability, suggesting right atrial pressure of 15 mmHg.      I have personally reviewed and interpreted  all radiology studies and my findings are as above.  VENTILATOR SETTINGS:    Cultures   Antimicrobials: Anti-infectives (From admission, onward)    None         Devices    LINES / TUBES:      Continuous Infusions:  sodium chloride 250 mL (08/13/21 1947)   furosemide (LASIX) 200 mg in dextrose 5% 100 mL (2mg /mL) infusion 8 mg/hr (08/15/21 0506)     Objective: Vitals:   08/15/21 0411 08/15/21 0512 08/15/21 0746 08/15/21 0750  BP: 92/62  (!) 87/48 (!) 92/53  Pulse: 65  69   Resp: 20  18   Temp: 97.6 F (36.4 C)  98 F (36.7 C)   TempSrc:   Oral   SpO2: 100%  98%   Weight:  102 kg    Height:        Intake/Output Summary (Last 24 hours) at 08/15/2021 0849 Last data filed at 08/15/2021 0751 Gross per 24 hour  Intake 1025.44 ml  Output 1450 ml  Net -424.56 ml   Filed Weights   08/12/21 0500 08/14/21 0446 08/15/21 0512  Weight: 100 kg 102.9 kg 102 kg    Examination:  General: A/O x4 No  acute respiratory distress Eyes: negative scleral hemorrhage, negative anisocoria, negative icterus ENT: Negative Runny nose, negative gingival bleeding, Neck:  Negative scars, masses, torticollis, lymphadenopathy, JVD Lungs: Clear to auscultation bilaterally without wheezes or crackles Cardiovascular: Regular rate and rhythm without murmur gallop or rub normal S1 and S2 Abdomen: Positive MORBID OBESITY, positive ANASARCA abdominal pain, nondistended, positive soft, bowel sounds, no rebound, no ascites, no appreciable mass Extremities: POSITIVE bilateral lower extremity edema (ANASARCA) Skin: Negative rashes, lesions, ulcers Psychiatric:  Negative depression, negative anxiety, negative fatigue, negative mania  Central nervous system:  Cranial nerves II through XII intact, tongue/uvula midline, all extremities muscle strength 5/5, sensation intact throughout, negative dysarthria, negative expressive aphasia, negative receptive aphasia.  .     Data Reviewed: Care  during the described time interval was provided by me .  I have reviewed this patient's available data, including medical history, events of note, physical examination, and all test results as part of my evaluation.   CBC: Recent Labs  Lab 08/09/21 0532 08/10/21 0809 08/11/21 0620 08/12/21 0534 08/13/21 0414 08/14/21 0423  WBC 8.2 8.1 9.0 8.2 8.0 8.6  NEUTROABS 6.2 6.2 6.8  --   --   --   HGB 9.7* 9.7* 9.9* 9.6* 9.8* 9.8*  HCT 30.9* 30.4* 31.3* 30.8* 32.3* 32.4*  MCV 88.3 89.7 91.3 90.9 90.0 91.5  PLT 186 196 214 229 264 751   Basic Metabolic Panel: Recent Labs  Lab 08/10/21 0809 08/11/21 0620 08/12/21 0534 08/12/21 1308 08/13/21 0414 08/13/21 0417 08/14/21 0423 08/15/21 0429  NA 144 142  --  141  --  138 139  --   K 3.3* 3.3*  --  4.7  --  4.2 4.0  --   CL 101 100  --  99  --  99 99  --   CO2 32 35*  --  27  --  27 27  --   GLUCOSE 151* 155*  --  155*  --  140* 145*  --   BUN 32* 34*  --  34*  --  34* 35*  --   CREATININE 1.47* 1.34*  --  1.43*  --  1.51* 1.55*  --   CALCIUM 8.7* 8.8*  --  8.9  --  9.2 9.3  --   MG 1.9 2.0 2.0  --  2.1  --  2.2 2.2  PHOS 2.0* 2.2* 2.4*  --  2.3* 2.3* 2.6 2.7   GFR: Estimated Creatinine Clearance: 35.1 mL/min (A) (by C-G formula based on SCr of 1.55 mg/dL (H)). Liver Function Tests: Recent Labs  Lab 08/13/21 0417  ALBUMIN 3.9   No results for input(s): LIPASE, AMYLASE in the last 168 hours. No results for input(s): AMMONIA in the last 168 hours. Coagulation Profile: Recent Labs  Lab 08/10/21 1412  INR 1.4*   Cardiac Enzymes: No results for input(s): CKTOTAL, CKMB, CKMBINDEX, TROPONINI in the last 168 hours. BNP (last 3 results) No results for input(s): PROBNP in the last 8760 hours. HbA1C: No results for input(s): HGBA1C in the last 72 hours. CBG: Recent Labs  Lab 08/14/21 0805 08/14/21 1121 08/14/21 1647 08/14/21 2121 08/15/21 0742  GLUCAP 138* 190* 160* 138* 118*   Lipid Profile: No results for input(s):  CHOL, HDL, LDLCALC, TRIG, CHOLHDL, LDLDIRECT in the last 72 hours. Thyroid Function Tests: No results for input(s): TSH, T4TOTAL, FREET4, T3FREE, THYROIDAB in the last 72 hours. Anemia Panel: No results for input(s): VITAMINB12, FOLATE, FERRITIN, TIBC, IRON, RETICCTPCT in the last 72 hours.  Urine analysis:    Component Value Date/Time   COLORURINE YELLOW (A) 04/11/2021 1215   APPEARANCEUR HAZY (A) 04/11/2021 1215   APPEARANCEUR Clear 06/06/2020 1339   LABSPEC 1.011 04/11/2021 1215   PHURINE 5.0 04/11/2021 1215   GLUCOSEU NEGATIVE 04/11/2021 1215   HGBUR NEGATIVE 04/11/2021 1215   BILIRUBINUR NEGATIVE 04/11/2021 1215   BILIRUBINUR Negative 06/06/2020 1339   KETONESUR NEGATIVE 04/11/2021 1215   PROTEINUR NEGATIVE 04/11/2021 1215   NITRITE NEGATIVE 04/11/2021 1215   LEUKOCYTESUR MODERATE (A) 04/11/2021 1215   Sepsis Labs: @LABRCNTIP (procalcitonin:4,lacticidven:4)  ) Recent Results (from the past 240 hour(s))  Body fluid culture w Gram Stain     Status: None   Collection Time: 08/05/21  5:50 PM   Specimen: Pericardial  Result Value Ref Range Status   Specimen Description   Final    PERICARDIAL Performed at [facility], [address], [place], [place] [address]    Special Requests   Final    NONE Performed at [facility], [address] [place], [place] [address]    Gram Stain   Final    FEW WBC PRESENT, PREDOMINANTLY MONONUCLEAR NO ORGANISMS SEEN    Culture   Final    NO GROWTH 3 DAYS Performed at [facility], [place] [address] [place], [place] [address]    Report Status 08/09/2021 FINAL  Final         Radiology Studies: DG Chest Port 1 View  Result Date: 08/14/2021 CLINICAL DATA:  Anemia EXAM: PORTABLE CHEST 1 VIEW COMPARISON:  08/08/2021, CT 08/04/2021 FINDINGS: Moderate left and small right-sided pleural effusion is, grossly stable on the left, suspect small increased on the right. Cardiomegaly with vascular congestion and edema.  Left greater than right basilar consolidations. IMPRESSION: 1. Cardiomegaly with vascular congestion and pulmonary edema. Moderate left and small right pleural effusion, suspect slight increase in size of right pleural effusion. Basilar airspace disease which may be due to atelectasis or pneumonia. Electronically Signed   By: Donavan Foil M.D.   On: 08/14/2021 16:55        Scheduled Meds:  apixaban  5 mg Oral BID   docusate sodium  100 mg Oral Daily   folic acid  1 mg Oral Daily   hydrocortisone   Rectal TID   insulin aspart  0-5 Units Subcutaneous QHS   insulin aspart  0-9 Units Subcutaneous TID WC   iron polysaccharides  150 mg Oral Daily   latanoprost  1 drop Both Eyes QHS   metolazone  2.5 mg Oral Daily   metoprolol tartrate  12.5 mg Oral Q6H   midodrine  10 mg Oral TID WC   pantoprazole (PROTONIX) IV  40 mg Intravenous Q12H   polyethylene glycol  17 g Oral Daily   Ensure Max Protein  11 oz Oral BID   spironolactone  25 mg Oral Daily   vitamin B-12  500 mcg Oral Daily   Continuous Infusions:  sodium chloride 250 mL (08/13/21 1947)   furosemide (LASIX) 200 mg in dextrose 5% 100 mL (2mg /mL) infusion 8 mg/hr (08/15/21 0506)     LOS: 20 days   The patient is critically ill with multiple organ systems failure and requires high complexity decision making for assessment and support, frequent evaluation and titration of therapies, application of advanced monitoring technologies and extensive interpretation of multiple databases. Critical Care Time devoted to patient care services described in this note  Time spent: 40 minutes     Jacquelene Kopecky J,  MD [facility]   If 7PM-7AM, please contact night-coverage 08/15/2021, 8:49 AM

## 2021-08-15 NOTE — Progress Notes (Addendum)
Unable to tolerate IV K replacement at slowest rate through PIV. K Infusion paused, consulting IV team for better access.

## 2021-08-15 NOTE — Progress Notes (Addendum)
Remains up in recliner this shift. Left shin with foam dressing intact.  Old open blister healed over.  Area cleansed with saline.  Xerofoam gauze and foam dressing applied.  Upper abdomen dressing with old dried gauze to puncture site.  Dressing removed., yellow drainage noted on dressing.  Area cleansed with saline, gauze dressing and tegaderm applied.  No drainage noted at this time.

## 2021-08-15 NOTE — Progress Notes (Signed)
Progress Note  Patient Name: Karen Farley Date of Encounter: 08/15/2021  [facility] Cardiologist: Kate Sable, MD   Subjective   No acute events overnight, breathing is okay, still with leg edema.  Inpatient Medications    Scheduled Meds:  apixaban  5 mg Oral BID   docusate sodium  100 mg Oral Daily   folic acid  1 mg Oral Daily   hydrocortisone   Rectal TID   insulin aspart  0-5 Units Subcutaneous QHS   insulin aspart  0-9 Units Subcutaneous TID WC   iron polysaccharides  150 mg Oral Daily   latanoprost  1 drop Both Eyes QHS   metolazone  2.5 mg Oral Daily   metoprolol tartrate  12.5 mg Oral Q6H   midodrine  10 mg Oral TID WC   pantoprazole (PROTONIX) IV  40 mg Intravenous Q12H   polyethylene glycol  17 g Oral Daily   Ensure Max Protein  11 oz Oral BID   spironolactone  25 mg Oral Daily   vitamin B-12  500 mcg Oral Daily   Continuous Infusions:  sodium chloride 250 mL (08/13/21 1947)   furosemide (LASIX) 200 mg in dextrose 5% 100 mL (2mg /mL) infusion 8 mg/hr (08/15/21 0506)   potassium chloride     PRN Meds: acetaminophen, bisacodyl, lidocaine HCl (PF), lip balm, traMADol   Vital Signs    Vitals:   08/15/21 0512 08/15/21 0746 08/15/21 0750 08/15/21 1134  BP:  (!) 87/48 (!) 92/53 97/72  Pulse:  69  74  Resp:  18  18  Temp:  98 F (36.7 C)  98.1 F (36.7 C)  TempSrc:  Oral    SpO2:  98%  99%  Weight: 102 kg     Height:        Intake/Output Summary (Last 24 hours) at 08/15/2021 1557 Last data filed at 08/15/2021 1200 Gross per 24 hour  Intake 852.21 ml  Output 1300 ml  Net -447.79 ml   Last 3 Weights 08/15/2021 08/14/2021 08/12/2021  Weight (lbs) 224 lb 14.4 oz 226 lb 12.8 oz 220 lb 7.4 oz  Weight (kg) 102.014 kg 102.876 kg 100 kg      Telemetry    Atrial fibrillation heart rate 70- Personally Reviewed  ECG     - Personally Reviewed  Physical Exam   GEN: Soft-spoken, Neck: No JVD Cardiac: Irregular  irregular Respiratory: Rhonchorous breath sounds at bases GI: Soft, nontender, non-distended  MS: 2-3+ edema Neuro:  Nonfocal  Psych: Normal affect   Labs    High Sensitivity Troponin:   Recent Labs  Lab 07/26/21 1708  TROPONINIHS 17     Chemistry Recent Labs  Lab 08/13/21 0417 08/14/21 0423 08/15/21 0429 08/15/21 0906 08/15/21 1530  NA 138 139 135 135  --   K 4.2 4.0 3.3* 3.1*  --   CL 99 99 96* 95*  --   CO2 27 27 30 28   --   GLUCOSE 140* 145* 145* 159*  --   BUN 34* 35* 33* 31*  --   CREATININE 1.51* 1.55* 1.79* 1.72*  --   CALCIUM 9.2 9.3 9.0 9.2  --   MG  --  2.2 2.2  --  2.1  PROT  --   --   --  6.5  --   ALBUMIN 3.9  --   --  4.0  --   AST  --   --   --  27  --   ALT  --   --   --  17  --   ALKPHOS  --   --   --  73  --   BILITOT  --   --   --  2.7*  --   GFRNONAA 36* 35* 29* 31*  --   ANIONGAP 12 13 9 12   --     Lipids No results for input(s): CHOL, TRIG, HDL, LABVLDL, LDLCALC, CHOLHDL in the last 168 hours.  Hematology Recent Labs  Lab 08/13/21 0414 08/14/21 0423 08/15/21 0906  WBC 8.0 8.6 6.6  RBC 3.59* 3.54* 3.70*  HGB 9.8* 9.8* 10.4*  HCT 32.3* 32.4* 33.1*  MCV 90.0 91.5 89.5  MCH 27.3 27.7 28.1  MCHC 30.3 30.2 31.4  RDW 26.2* 26.8* 26.4*  PLT 264 272 287   Thyroid No results for input(s): TSH, FREET4 in the last 168 hours.  BNP No results for input(s): BNP, PROBNP in the last 168 hours.   DDimer No results for input(s): DDIMER in the last 168 hours.   Radiology    DG Chest Port 1 View  Result Date: 08/14/2021 CLINICAL DATA:  Anemia EXAM: PORTABLE CHEST 1 VIEW COMPARISON:  08/08/2021, CT 08/04/2021 FINDINGS: Moderate left and small right-sided pleural effusion is, grossly stable on the left, suspect small increased on the right. Cardiomegaly with vascular congestion and edema. Left greater than right basilar consolidations. IMPRESSION: 1. Cardiomegaly with vascular congestion and pulmonary edema. Moderate left and small right pleural  effusion, suspect slight increase in size of right pleural effusion. Basilar airspace disease which may be due to atelectasis or pneumonia. Electronically Signed   By: Donavan Foil M.D.   On: 08/14/2021 16:55    Cardiac Studies   Limited echo 08/08/2021 1. Left ventricular ejection fraction, by estimation, is 60 to 65%. The  left ventricle has normal function.   2. Right ventricular systolic function is normal. There is severely  elevated pulmonary artery systolic pressure. The estimated right  ventricular systolic pressure is 91.6 mmHg.   3. Large pleural effusion in the left lateral region.   4. The inferior vena cava is dilated in size with <50% respiratory  variability, suggesting right atrial pressure of 15 mmHg.   Patient Profile     75 y.o. female with history of HFpEF, hypertension, CKD 3, permanent atrial fibrillation presenting with hypotension, GI bleed and large pericardial effusion s/p pericardial drainage placement.  Assessment & Plan    Pericardial effusion -S/p pericardiocentesis and drain placement with hemorrhagic effusion -Repeat echo today to reevaluate pericardial fluid.  Echo 5 days ago showed small effusion. -If pericardial fluid reaccumulate, consider CT surgery consult for pericardial window.  2.  HFpEF, volume overload -on midodrine. -Blood pressures appear more stable. -Continue Lasix drip, metolazone -Monitor blood pressures closely.  Titrate midodrine off as BP permits.  3.  Permanent A. Fib -Rate controlled on Lopressor. -Eliquis 5 mg twice daily.  Total encounter time 35 minutes  Greater than 50% was spent in counseling and coordination of care with the patient     Signed, Kate Sable, MD  08/15/2021, 3:57 PM

## 2021-08-15 NOTE — Progress Notes (Signed)
PT Cancellation Note  Patient Details Name: Karen Farley MRN: [id number] DOB: May 06, 1946   Cancelled Treatment:     Pt had Echo this am, therapist in this pm for continued PT. Pt sitting up in chair, B LE's appear more swollen today with blisters noted at Right lower shin area. Pt declined elevating as well as any activity. Pt understands importance of mobility, stating she would try next attempt.   [name] 08/15/2021, 2:26 PM

## 2021-08-15 NOTE — Progress Notes (Signed)
*  PRELIMINARY RESULTS* Echocardiogram 2D Echocardiogram has been performed.  Karen Farley 08/15/2021, 8:25 AM

## 2021-08-15 NOTE — Progress Notes (Signed)
[facility]  ROUNDING NOTE   Subjective:   [name] was admitted to [facility] on 07/26/2021 for Morbid obesity ([place]) [E66.01] DOE (dyspnea on exertion) [R06.09] Recurrent left pleural effusion [J90] Hypotension [I95.9] Type 2 diabetes mellitus without complication, without long-term current use of insulin ([place]) [E11.9] Atrial fibrillation, unspecified type ([place]) [I48.91] Chronic congestive heart failure, unspecified heart failure type [facility]) [I50.9]  Patient was last seen by my partner, Dr. Candiss Norse, on 05/31/21.  Patient seen resting in bed Completed breakfast tray Edema slightly improved Denies shortness of breath  Objective:  Vital signs in last 24 hours:  Temp:  [97.6 F (36.4 C)-98.1 F (36.7 C)] 98.1 F (36.7 C) (10/26 1134) Pulse Rate:  [65-81] 74 (10/26 1134) Resp:  [16-20] 18 (10/26 1134) BP: (87-117)/(48-72) 97/72 (10/26 1134) SpO2:  [95 %-100 %] 99 % (10/26 1134) Weight:  [102 kg] 102 kg (10/26 0512)  Weight change: -0.862 kg Filed Weights   08/12/21 0500 08/14/21 0446 08/15/21 0512  Weight: 100 kg 102.9 kg 102 kg    Intake/Output: I/O last 3 completed shifts: In: 1523.4 [P.O.:1200; I.V.:323.4] Out: 1450 [Urine:1450]   Intake/Output this shift:  Total I/O In: 0  Out: 700 [Urine:700]  Physical Exam: General: NAD  Head: Normocephalic, atraumatic. Moist oral mucosal membranes  Eyes: Anicteric  Lungs:  Clear bilaterally, normal effort  Heart: Irregular rhythm  Abdomen:  Soft, nontender  Extremities:  ++ peripheral edema.  Neurologic: Nonfocal, moving all four extremities  Skin: Mild erythema        Basic Metabolic Panel: Recent Labs  Lab 08/11/21 0620 08/12/21 0534 08/12/21 1308 08/13/21 0414 08/13/21 0417 08/14/21 0423 08/15/21 0429 08/15/21 0906  NA 142  --  141  --  138 139 135 135  K 3.3*  --  4.7  --  4.2 4.0 3.3* 3.1*  CL 100  --  99  --  99 99 96* 95*  CO2 35*  --  27  --  27 27 30 28   GLUCOSE 155*  --  155*   --  140* 145* 145* 159*  BUN 34*  --  34*  --  34* 35* 33* 31*  CREATININE 1.34*  --  1.43*  --  1.51* 1.55* 1.79* 1.72*  CALCIUM 8.8*  --  8.9  --  9.2 9.3 9.0 9.2  MG 2.0 2.0  --  2.1  --  2.2 2.2  --   PHOS 2.2* 2.4*  --  2.3* 2.3* 2.6 2.7  --      Liver Function Tests: Recent Labs  Lab 08/13/21 0417 08/15/21 0906  AST  --  27  ALT  --  17  ALKPHOS  --  73  BILITOT  --  2.7*  PROT  --  6.5  ALBUMIN 3.9 4.0    No results for input(s): LIPASE, AMYLASE in the last 168 hours. No results for input(s): AMMONIA in the last 168 hours.  CBC: Recent Labs  Lab 08/09/21 0532 08/10/21 0809 08/11/21 6144 08/12/21 0534 08/13/21 0414 08/14/21 0423 08/15/21 0906  WBC 8.2 8.1 9.0 8.2 8.0 8.6 6.6  NEUTROABS 6.2 6.2 6.8  --   --   --   --   HGB 9.7* 9.7* 9.9* 9.6* 9.8* 9.8* 10.4*  HCT 30.9* 30.4* 31.3* 30.8* 32.3* 32.4* 33.1*  MCV 88.3 89.7 91.3 90.9 90.0 91.5 89.5  PLT 186 196 214 229 264 272 287     Cardiac Enzymes: No results for input(s): CKTOTAL, CKMB, CKMBINDEX, TROPONINI  in the last 168 hours.  BNP: Invalid input(s): POCBNP  CBG: Recent Labs  Lab 08/14/21 1121 08/14/21 1647 08/14/21 2121 08/15/21 0742 08/15/21 1134  GLUCAP 190* 160* 138* 118* 151*     Microbiology: Results for orders placed or performed during the hospital encounter of 07/26/21  Resp Panel by RT-PCR (Flu A&B, Covid) Nasopharyngeal Swab     Status: None   Collection Time: 07/26/21  7:19 PM   Specimen: Nasopharyngeal Swab; Nasopharyngeal(NP) swabs in vial transport medium  Result Value Ref Range Status   SARS Coronavirus 2 by RT PCR NEGATIVE NEGATIVE Final    Comment: (NOTE) SARS-CoV-2 target nucleic acids are NOT DETECTED.  The SARS-CoV-2 RNA is generally detectable in upper respiratory specimens during the acute phase of infection. The lowest concentration of SARS-CoV-2 viral copies this assay can detect is 138 copies/mL. A negative result does not preclude SARS-Cov-2 infection and  should not be used as the sole basis for treatment or other patient management decisions. A negative result may occur with  improper specimen collection/handling, submission of specimen other than nasopharyngeal swab, presence of viral mutation(s) within the areas targeted by this assay, and inadequate number of viral copies(<138 copies/mL). A negative result must be combined with clinical observations, patient history, and epidemiological information. The expected result is Negative.  Fact Sheet for Patients:  EntrepreneurPulse.com.au  Fact Sheet for Healthcare Providers:  IncredibleEmployment.be  This test is no t yet approved or cleared by the [place] FDA and  has been authorized for detection and/or diagnosis of SARS-CoV-2 by FDA under an Emergency Use Authorization (EUA). This EUA will remain  in effect (meaning this test can be used) for the duration of the COVID-19 declaration under Section 564(b)(1) of the Act, 21 U.S.C.section 360bbb-3(b)(1), unless the authorization is terminated  or revoked sooner.       Influenza A by PCR NEGATIVE NEGATIVE Final   Influenza B by PCR NEGATIVE NEGATIVE Final    Comment: (NOTE) The Xpert Xpress SARS-CoV-2/FLU/RSV plus assay is intended as an aid in the diagnosis of influenza from Nasopharyngeal swab specimens and should not be used as a sole basis for treatment. Nasal washings and aspirates are unacceptable for Xpert Xpress SARS-CoV-2/FLU/RSV testing.  Fact Sheet for Patients: EntrepreneurPulse.com.au  Fact Sheet for Healthcare Providers: IncredibleEmployment.be  This test is not yet approved or cleared by the [place] FDA and has been authorized for detection and/or diagnosis of SARS-CoV-2 by FDA under an Emergency Use Authorization (EUA). This EUA will remain in effect (meaning this test can be used) for the duration of the COVID-19 declaration  under Section 564(b)(1) of the Act, 21 U.S.C. section 360bbb-3(b)(1), unless the authorization is terminated or revoked.  Performed at [facility], [address] [place], [place] [address]   Body fluid culture w Gram Stain     Status: None   Collection Time: 07/28/21  1:53 PM   Specimen: PATH Cytology Pleural fluid  Result Value Ref Range Status   Specimen Description   Final    PLEURAL Performed at [facility], [address] [place], [place] [address]    Special Requests   Final    NONE Performed at [facility], [address] [place], [place] [address]    Gram Stain   Final    NO SQUAMOUS EPITHELIAL CELLS SEEN FEW WBC SEEN NO ORGANISMS SEEN    Culture   Final    NO GROWTH 3 DAYS Performed at [facility], [address] [place], [place] [address]    Report Status 08/01/2021 FINAL  Final  Body fluid culture w Gram Stain     Status: None   Collection Time: 08/01/21  3:36 PM   Specimen: PATH Cytology Peritoneal fluid  Result Value Ref Range Status   Specimen Description   Final    PERITONEAL Performed at [facility], [address], [place], [place] [address]    Special Requests   Final    NONE Performed at [facility], [address] [place], [place] [address]    Gram Stain   Final    RARE WBC PRESENT,BOTH PMN AND MONONUCLEAR NO ORGANISMS SEEN    Culture   Final    NO GROWTH 3 DAYS Performed at [facility], [place] [address], [place], [place] [address]    Report Status 08/05/2021 FINAL  Final  MRSA Next Gen by PCR, Nasal     Status: None   Collection Time: 08/04/21  8:56 PM   Specimen: Nasal Mucosa; Nasal Swab  Result Value Ref Range Status   MRSA by PCR Next Gen NOT DETECTED NOT DETECTED Final    Comment: (NOTE) The GeneXpert MRSA Assay (FDA approved for NASAL specimens only), is one component of a comprehensive MRSA colonization surveillance program. It is not intended to diagnose MRSA  infection nor to guide or monitor treatment for MRSA infections. Test performance is not FDA approved in patients less than 47 years old. Performed at [facility], [address] [place], [place] [address]   Body fluid culture w Gram Stain     Status: None   Collection Time: 08/05/21  5:50 PM   Specimen: Pericardial  Result Value Ref Range Status   Specimen Description   Final    PERICARDIAL Performed at [facility], [address] [place], [place] [address]    Special Requests   Final    NONE Performed at [facility], [address] [place], [place] [address]    Gram Stain   Final    FEW WBC PRESENT, PREDOMINANTLY MONONUCLEAR NO ORGANISMS SEEN    Culture   Final    NO GROWTH 3 DAYS Performed at [facility], [place] [address], [place], [place] [address]    Report Status 08/09/2021 FINAL  Final    Coagulation Studies: No results for input(s): LABPROT, INR in the last 72 hours.   Urinalysis: No results for input(s): COLORURINE, LABSPEC, PHURINE, GLUCOSEU, HGBUR, BILIRUBINUR, KETONESUR, PROTEINUR, UROBILINOGEN, NITRITE, LEUKOCYTESUR in the last 72 hours.  Invalid input(s): APPERANCEUR    Imaging: DG Chest Port 1 View  Result Date: 08/14/2021 CLINICAL DATA:  Anemia EXAM: PORTABLE CHEST 1 VIEW COMPARISON:  08/08/2021, CT 08/04/2021 FINDINGS: Moderate left and small right-sided pleural effusion is, grossly stable on the left, suspect small increased on the right. Cardiomegaly with vascular congestion and edema. Left greater than right basilar consolidations. IMPRESSION: 1. Cardiomegaly with vascular congestion and pulmonary edema. Moderate left and small right pleural effusion, suspect slight increase in size of right pleural effusion. Basilar airspace disease which may be due to atelectasis or pneumonia. Electronically Signed   By: Donavan Foil M.D.   On: 08/14/2021 16:55     Medications:    sodium chloride 250 mL (08/13/21 1947)    furosemide (LASIX) 200 mg in dextrose 5% 100 mL (2mg /mL) infusion 8 mg/hr (08/15/21 0506)    apixaban  5 mg Oral BID   docusate sodium  100 mg  Oral Daily   folic acid  1 mg Oral Daily   hydrocortisone   Rectal TID   insulin aspart  0-5 Units Subcutaneous QHS   insulin aspart  0-9 Units Subcutaneous TID WC   iron polysaccharides  150 mg Oral Daily   latanoprost  1 drop Both Eyes QHS   metolazone  2.5 mg Oral Daily   metoprolol tartrate  12.5 mg Oral Q6H   midodrine  10 mg Oral TID WC   pantoprazole (PROTONIX) IV  40 mg Intravenous Q12H   polyethylene glycol  17 g Oral Daily   Ensure Max Protein  11 oz Oral BID   spironolactone  25 mg Oral Daily   vitamin B-12  500 mcg Oral Daily     Assessment/ Plan:  [name] is a 75 y.o. white female with diastolic congestive heart failure, hypertension, hyperlipidemia, lymphedema, atrial fibrillation, peripheral vascular disease who is admitted to [facility] on 07/26/2021 for Morbid obesity ([place]) [E66.01] DOE (dyspnea on exertion) [R06.09] Recurrent left pleural effusion [J90] Hypotension [I95.9] Type 2 diabetes mellitus without complication, without long-term current use of insulin ([place]) [E11.9] Atrial fibrillation, unspecified type ([place]) [I48.91] Chronic congestive heart failure, unspecified heart failure type ([place]) [I50.9]  Acute kidney injury on chronic kidney disease stage IIIB: with baseline creatinine of 1.66, GFR of 36 on 07/13/21. History of bland urine. 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 Hold benazepril. Creatinine remains at baseline - Continue Furosemide drip at current rate. Daily standing weights. Recorded weight loss of 0.9 kg.  - Continue to elevate legs while seated in the chair.  - Will consider Farxiga treatment once discharged.   - Will monitor renal function and continue 1277ml fluid restriction  Hypotension: BP 97/72 Soft with resumed diuretic therapy. Continue Midodrine and will  monitor  Acute exacerbation of diastolic congestive heart failure:  - Cardiology following, Echo on 08/07/21 shows EF 55% -Fluid restriction 1274ml daily -Furosemide as above - Continue Spironolactone 25mg  daily  Hyponatremia: with hypervolumia.   -Resolved   5. Left pleural effusion: Thoracentesis on 07/28/21 with 614ml removed. Paracentesis on 08/01/21 with 1.2L removed. Large pleural effusion seen on echo.  Pulmonology following. No intervention at this time  6. Pericardial effusion- Pericardiocentesis performed on 08/05/21 with 1.1L removed.  Repeat echo on 08/10/21 shows a small pericardial effusion. Cardiology following   LOS: 20 [place] 10/26/202211:42 AM

## 2021-08-16 ENCOUNTER — Inpatient Hospital Stay: Payer: Medicare PPO | Admitting: Radiology

## 2021-08-16 ENCOUNTER — Inpatient Hospital Stay: Payer: Medicare PPO

## 2021-08-16 DIAGNOSIS — E1121 Type 2 diabetes mellitus with diabetic nephropathy: Secondary | ICD-10-CM

## 2021-08-16 DIAGNOSIS — I5031 Acute diastolic (congestive) heart failure: Secondary | ICD-10-CM | POA: Diagnosis not present

## 2021-08-16 DIAGNOSIS — J9601 Acute respiratory failure with hypoxia: Secondary | ICD-10-CM | POA: Diagnosis not present

## 2021-08-16 DIAGNOSIS — D62 Acute posthemorrhagic anemia: Secondary | ICD-10-CM | POA: Diagnosis not present

## 2021-08-16 DIAGNOSIS — Z9889 Other specified postprocedural states: Secondary | ICD-10-CM

## 2021-08-16 DIAGNOSIS — R0609 Other forms of dyspnea: Secondary | ICD-10-CM | POA: Diagnosis not present

## 2021-08-16 HISTORY — PX: IR FLUORO GUIDE CV LINE RIGHT: IMG2283

## 2021-08-16 HISTORY — PX: IR THORACENTESIS RIGHT ASP PLEURAL SPACE W/IMG GUIDE: IMG5380

## 2021-08-16 LAB — COMPREHENSIVE METABOLIC PANEL
ALT: 18 U/L (ref 0–44)
AST: 28 U/L (ref 15–41)
Albumin: 3.8 g/dL (ref 3.5–5.0)
Alkaline Phosphatase: 70 U/L (ref 38–126)
Anion gap: 11 (ref 5–15)
BUN: 40 mg/dL — ABNORMAL HIGH (ref 8–23)
CO2: 28 mmol/L (ref 22–32)
Calcium: 9.1 mg/dL (ref 8.9–10.3)
Chloride: 95 mmol/L — ABNORMAL LOW (ref 98–111)
Creatinine, Ser: 2.01 mg/dL — ABNORMAL HIGH (ref 0.44–1.00)
GFR, Estimated: 25 mL/min — ABNORMAL LOW (ref >60)
Glucose, Bld: 206 mg/dL — ABNORMAL HIGH (ref 70–99)
Potassium: 3.4 mmol/L — ABNORMAL LOW (ref 3.5–5.1)
Sodium: 134 mmol/L — ABNORMAL LOW (ref 135–145)
Total Bilirubin: 2 mg/dL — ABNORMAL HIGH (ref 0.3–1.2)
Total Protein: 5.9 g/dL — ABNORMAL LOW (ref 6.5–8.1)

## 2021-08-16 LAB — AMYLASE, PLEURAL OR PERITONEAL FLUID: Amylase, Fluid: 23 U/L

## 2021-08-16 LAB — GLUCOSE, CAPILLARY
Glucose-Capillary: 133 mg/dL — ABNORMAL HIGH (ref 70–99)
Glucose-Capillary: 117 mg/dL — ABNORMAL HIGH (ref 70–99)
Glucose-Capillary: 112 mg/dL — ABNORMAL HIGH (ref 70–99)
Glucose-Capillary: 151 mg/dL — ABNORMAL HIGH (ref 70–99)

## 2021-08-16 LAB — BODY FLUID CELL COUNT WITH DIFFERENTIAL
Eos, Fluid: 0 %
Lymphs, Fluid: 50 %
Monocyte-Macrophage-Serous Fluid: 5 %
Neutrophil Count, Fluid: 45 %
Total Nucleated Cell Count, Fluid: 717 cu mm

## 2021-08-16 LAB — PROTEIN, PLEURAL OR PERITONEAL FLUID: Total protein, fluid: <3 g/dL

## 2021-08-16 LAB — LACTATE DEHYDROGENASE, PLEURAL OR PERITONEAL FLUID: LD, Fluid: 46 U/L — ABNORMAL HIGH (ref 3–23)

## 2021-08-16 LAB — GLUCOSE, PLEURAL OR PERITONEAL FLUID: Glucose, Fluid: 148 mg/dL

## 2021-08-16 LAB — MAGNESIUM: Magnesium: 2 mg/dL (ref 1.7–2.4)

## 2021-08-16 LAB — CBC
HCT: 32.3 % — ABNORMAL LOW (ref 36.0–46.0)
Hemoglobin: 10 g/dL — ABNORMAL LOW (ref 12.0–15.0)
MCH: 28.1 pg (ref 26.0–34.0)
MCHC: 31 g/dL (ref 30.0–36.0)
MCV: 90.7 fL (ref 80.0–100.0)
Platelets: 298 10*3/uL (ref 150–400)
RBC: 3.56 MIL/uL — ABNORMAL LOW (ref 3.87–5.11)
RDW: 26.5 % — ABNORMAL HIGH (ref 11.5–15.5)
WBC: 6.7 10*3/uL (ref 4.0–10.5)
nRBC: 0 % (ref 0.0–0.2)

## 2021-08-16 LAB — PHOSPHORUS: Phosphorus: 3.3 mg/dL (ref 2.5–4.6)

## 2021-08-16 LAB — ALBUMIN, PLEURAL OR PERITONEAL FLUID: Albumin, Fluid: <1.5 g/dL

## 2021-08-16 MED ORDER — CHLORHEXIDINE GLUCONATE CLOTH 2 % EX PADS
6.0000 | MEDICATED_PAD | Freq: Every day | CUTANEOUS | Status: DC
  Administered 2021-08-17 – 2021-08-27 (×10): 6 via TOPICAL

## 2021-08-16 MED ORDER — MIDAZOLAM HCL 2 MG/2ML IJ SOLN
INTRAMUSCULAR | Status: AC
  Filled 2021-08-16: qty 2

## 2021-08-16 MED ORDER — LIDOCAINE HCL 1 % IJ SOLN
5.0000 mL | Freq: Once | INTRAMUSCULAR | Status: AC
  Administered 2021-08-16: 5 mL via INTRADERMAL

## 2021-08-16 MED ORDER — POTASSIUM CHLORIDE 10 MEQ/50ML IV SOLN
10.0000 meq | INTRAVENOUS | Status: AC
Start: 2021-08-16 — End: 2021-08-17
  Administered 2021-08-16 (×6): 10 meq via INTRAVENOUS
  Filled 2021-08-16 (×6): qty 50

## 2021-08-16 MED ORDER — POTASSIUM CHLORIDE CRYS ER 20 MEQ PO TBCR
40.0000 meq | EXTENDED_RELEASE_TABLET | Freq: Once | ORAL | Status: AC
  Administered 2021-08-16: 40 meq via ORAL
  Filled 2021-08-16: qty 2

## 2021-08-16 MED ORDER — LIDOCAINE HCL 1 % IJ SOLN
INTRAMUSCULAR | Status: AC
  Administered 2021-08-16: 7 mL
  Filled 2021-08-16: qty 20

## 2021-08-16 MED ORDER — FENTANYL CITRATE (PF) 100 MCG/2ML IJ SOLN
INTRAMUSCULAR | Status: AC
  Filled 2021-08-16: qty 2

## 2021-08-16 NOTE — Progress Notes (Signed)
Per Dr Sherral Hammers. Patient is npo however she can take po am scheduled medications.

## 2021-08-16 NOTE — Progress Notes (Signed)
PROGRESS NOTE    Karen Farley  DXI:[id number] DOB: 1946-08-26 DOA: 07/26/2021 PCP: Valerie Roys, DO   Brief Narrative:  75 y.o. WF PMHx HFpEF, permanent atrial fibrillation on Eliquis, PAD, type 2 diabetes, CKD stage IIIb, lymphedema   Presented to hospital with increasing shortness of breath and edema.  Patient has been noticing increasing abdominal distention since her admission in August with severe lower extremity edema and dyspnea and exertion.   Subjective: 10/27 A/O x4.  States feels much more comfortable s/p thoracentesis.Discharged on 27 May 2021, weighed 218 pounds (98.8 kg).   Assessment & Plan:  Covid vaccination; vaccinated 3/3  Principal Problem:   Acute blood loss anemia Active Problems:   Type 2 diabetes mellitus with renal complication (HCC)   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 diabetes mellitus (HCC)   Atrial fibrillation (HCC)   Acute heart failure with preserved ejection fraction (HFpEF) (HCC)   Pleural effusion   Acute on chronic diastolic CHF (congestive heart failure) (HCC)   Chronic anticoagulation   Shock circulatory (HCC)   Pericardial effusion  GI bleed with hemorrhagic shock. --Patient was noted to have significant drop in hemoglobin to 9.7 from 12.4 and was transferred to the ICU.   --GI Dr Alice Reichert from 10/19-- May forego endoluminal evaluation for the near future given lack of bleeding recurrence and persistence of cardiopulmonary issues. Patient is in agreement. --10/23-- d/w Dr Russo--no Rectal bleed. Ok to resume po eliquis   Large pericardial effusion  -S/p percardiocentesis  Large Pleural effusion s/p thoracentesis Patient underwent a pericardiocentesis on 08/05/2021 with removal of 700 mL of pleural fluid.  Inflammatory markers were unremarkable.  Cardiology monitoring the patient while in the hospital.  Still on vasopressors due to hypotension. --repeat echo 10/21--no pericaridal effusion    Left pleural effusion secondary to acute  exacerbation of chronic diastolic CHF Severe Anasarca -status post thoracocentesis with removal of 600 mL of fluid on 07/28/2021. --10/20-- torsemide 20 mg qd with IV albumin --10/21-- IV lqasix 40 mg bid per cardiology --10/22 cont IV lasix--good uop --10/23--IV lasix --10/24-- Dr Juleen China wants to start IV lasxi gtt --10/25-cont Lasix gtt     Hypotension suspect  -Secondary to hemorrhagic shock and low intravsacular volume --Antihypertensives on hold.   --cont po  midodrine.   --IV Albumin       Acute kidney injury on CKD stage 3b --Patient's baseline creatinine around 1.4-1.7.  Currently at baseline.   --Nephrology followed the patient during hospitalization.  -- Benazepril and spironolactone on hold.   Permanent atrial fibrillation --Was previously on Eliquis.  Currently on hold due to GI bleed.  -- Rate controlled at this time--not on any po rate controlling meds --10/21--started on IV heparin gtt--monitor hgb  --10/22-- hgb remains stable --10/23-- discussed with G.I. Dr. Virgina Jock and cardiology Dr. Rockey Situ plan is to move forward with starting eliquis while patient is in-house to make sure she tolerates. DC heparin drip. Patient is in agreement with plan. -10/24--Hgb stable   Type 2 diabetes mellitus Diet controlled at home.   --Continue sliding scale insulin while in the hospital.   Iron deficiency/Vitamin B12  --patient received Venofer -cont oral iron and vitamin B12 supplements.  Hypokalemia -Potassium goal> 4 - 10/27 potassium IV 60 mEq  Morbidly Obese  Body mass index is 40.93 kg/m.     DVT prophylaxis: Eliquis (held for thoracentesis) Code Status: Full Family Communication: 10/27 husband at bedside for discussion plan of care all questions answered Status is: Inpatient  Dispo: The patient is from: Home              Anticipated d/c is to: Home              Anticipated d/c date is: > 3 days              Patient currently is not medically stable to  d/c.      Consultants:  Nephrology IR    Procedures/Significant Events:  10/8 s/p left thoracentesis: 625ml aspirated 10/12 paracentesis 1.2L aspirated .  10/16 Echocardiogram performed: Pericardiocentesis 768ml aspirated and drain placed  Failed Lasix IV advanced to Lasix gtt; however, Lasix and Eliquis stopped 10/15 and patient transferred to ICU after had episode of GI bleed with drop in hgb, s/p 2U pRBC and vasopressor support initiated.    10/21 Echocardiogram Left Ventricle: Left ventricular ejection fraction, by estimation, is 60  to 65%. The left ventricle has normal function. There is mild left  ventricular hypertrophy.   Right Ventricle: The right ventricular size is normal. No increase in  right ventricular wall thickness. Right ventricular systolic function is  normal. There is severely elevated pulmonary artery systolic pressure. The  tricuspid regurgitant velocity is  3.42 m/s, and with an assumed right atrial pressure of 15 mmHg, the  estimated right ventricular systolic pressure is 96.7 mmHg.   Pericardium: A small pericardial effusion is present. The pericardial  effusion is posterior to the left ventricle and localized near the right  atrium. Presence of pericardial fat pad.   Mitral Valve: There is mild thickening of the mitral valve leaflet(s).  Mild mitral annular calcification. MV peak gradient, 15.1 mmHg. The mean  mitral valve gradient is 7.0 mmHg.   Tricuspid Valve: The tricuspid valve is not well visualized.   Aortic Valve: The aortic valve has an indeterminant number of cusps. There  is moderate calcification of the aortic valve. There is moderate  thickening of the aortic valve.   Venous: The inferior vena cava is dilated in size with less than 50%  respiratory variability, suggesting right atrial pressure of 15 mmHg.  10/27 LEFT thoracentesis: Aspirated 700 mL of clear yellow fluid was removed      I have personally reviewed and  interpreted all radiology studies and my findings are as above.  VENTILATOR SETTINGS:    Cultures   Antimicrobials: Anti-infectives (From admission, onward)    None         Devices    LINES / TUBES:      Continuous Infusions:  sodium chloride 250 mL (08/13/21 1947)   furosemide (LASIX) 200 mg in dextrose 5% 100 mL (2mg /mL) infusion 8 mg/hr (08/16/21 0701)     Objective: Vitals:   08/16/21 0333 08/16/21 0516 08/16/21 0736 08/16/21 1315  BP: 113/78  97/61 97/65  Pulse: 72  79 81  Resp: 18  17   Temp: 97.6 F (36.4 C)  98.1 F (36.7 C) 97.6 F (36.4 C)  TempSrc: Oral   Oral  SpO2: 100%  98% 97%  Weight:  101.5 kg    Height:        Intake/Output Summary (Last 24 hours) at 08/16/2021 1407 Last data filed at 08/16/2021 0947 Gross per 24 hour  Intake 0 ml  Output 3650 ml  Net -3650 ml    Filed Weights   08/14/21 0446 08/15/21 0512 08/16/21 0516  Weight: 102.9 kg 102 kg 101.5 kg    Examination:  General: A/O x4 No acute respiratory distress Eyes:  negative scleral hemorrhage, negative anisocoria, negative icterus ENT: Negative Runny nose, negative gingival bleeding, Neck:  Negative scars, masses, torticollis, lymphadenopathy, JVD Lungs: Clear to auscultation bilaterally without wheezes or crackles Cardiovascular: Regular rate and rhythm without murmur gallop or rub normal S1 and S2 Abdomen: Positive MORBID OBESITY, positive ANASARCA abdominal pain, nondistended, positive soft, bowel sounds, no rebound, no ascites, no appreciable mass Extremities: POSITIVE bilateral lower extremity edema (ANASARCA) Skin: Negative rashes, lesions, ulcers Psychiatric:  Negative depression, negative anxiety, negative fatigue, negative mania  Central nervous system:  Cranial nerves II through XII intact, tongue/uvula midline, all extremities muscle strength 5/5, sensation intact throughout, negative dysarthria, negative expressive aphasia, negative receptive aphasia.  .      Data Reviewed: Care during the described time interval was provided by me .  I have reviewed this patient's available data, including medical history, events of note, physical examination, and all test results as part of my evaluation.   CBC: Recent Labs  Lab 08/10/21 0809 08/11/21 0620 08/12/21 0534 08/13/21 0414 08/14/21 0423 08/15/21 0906 08/16/21 0601  WBC 8.1 9.0 8.2 8.0 8.6 6.6 6.7  NEUTROABS 6.2 6.8  --   --   --   --   --   HGB 9.7* 9.9* 9.6* 9.8* 9.8* 10.4* 10.0*  HCT 30.4* 31.3* 30.8* 32.3* 32.4* 33.1* 32.3*  MCV 89.7 91.3 90.9 90.0 91.5 89.5 90.7  PLT 196 214 229 264 272 287 381    Basic Metabolic Panel: Recent Labs  Lab 08/13/21 0414 08/13/21 0417 08/14/21 0423 08/15/21 0429 08/15/21 0906 08/15/21 1530 08/16/21 0601  NA  --  138 139 135 135  --  134*  K  --  4.2 4.0 3.3* 3.1*  --  3.4*  CL  --  99 99 96* 95*  --  95*  CO2  --  27 27 30 28   --  28  GLUCOSE  --  140* 145* 145* 159*  --  206*  BUN  --  34* 35* 33* 31*  --  40*  CREATININE  --  1.51* 1.55* 1.79* 1.72*  --  2.01*  CALCIUM  --  9.2 9.3 9.0 9.2  --  9.1  MG 2.1  --  2.2 2.2  --  2.1 2.0  PHOS 2.3* 2.3* 2.6 2.7  --   --  3.3    GFR: Estimated Creatinine Clearance: 27 mL/min (A) (by C-G formula based on SCr of 2.01 mg/dL (H)). Liver Function Tests: Recent Labs  Lab 08/13/21 0417 08/15/21 0906 08/16/21 0601  AST  --  27 28  ALT  --  17 18  ALKPHOS  --  73 70  BILITOT  --  2.7* 2.0*  PROT  --  6.5 5.9*  ALBUMIN 3.9 4.0 3.8    No results for input(s): LIPASE, AMYLASE in the last 168 hours. No results for input(s): AMMONIA in the last 168 hours. Coagulation Profile: Recent Labs  Lab 08/10/21 1412  INR 1.4*    Cardiac Enzymes: No results for input(s): CKTOTAL, CKMB, CKMBINDEX, TROPONINI in the last 168 hours. BNP (last 3 results) No results for input(s): PROBNP in the last 8760 hours. HbA1C: No results for input(s): HGBA1C in the last 72 hours. CBG: Recent Labs  Lab  08/15/21 1134 08/15/21 1609 08/15/21 2058 08/16/21 0734 08/16/21 1313  GLUCAP 151* 174* 158* 133* 117*    Lipid Profile: No results for input(s): CHOL, HDL, LDLCALC, TRIG, CHOLHDL, LDLDIRECT in the last 72 hours. Thyroid Function Tests: No results for  input(s): TSH, T4TOTAL, FREET4, T3FREE, THYROIDAB in the last 72 hours. Anemia Panel: No results for input(s): VITAMINB12, FOLATE, FERRITIN, TIBC, IRON, RETICCTPCT in the last 72 hours. Urine analysis:    Component Value Date/Time   COLORURINE YELLOW (A) 04/11/2021 1215   APPEARANCEUR HAZY (A) 04/11/2021 1215   APPEARANCEUR Clear 06/06/2020 1339   LABSPEC 1.011 04/11/2021 1215   PHURINE 5.0 04/11/2021 1215   GLUCOSEU NEGATIVE 04/11/2021 1215   HGBUR NEGATIVE 04/11/2021 1215   BILIRUBINUR NEGATIVE 04/11/2021 1215   BILIRUBINUR Negative 06/06/2020 1339   KETONESUR NEGATIVE 04/11/2021 1215   PROTEINUR NEGATIVE 04/11/2021 1215   NITRITE NEGATIVE 04/11/2021 1215   LEUKOCYTESUR MODERATE (A) 04/11/2021 1215   Sepsis Labs: @LABRCNTIP (procalcitonin:4,lacticidven:4)  ) No results found for this or any previous visit (from the past 240 hour(s)).        Radiology Studies: DG Chest Port 1 View  Result Date: 08/16/2021 CLINICAL DATA:  Post thoracentesis EXAM: PORTABLE CHEST 1 VIEW COMPARISON:  Portable exam 1340 hours compared to 08/14/2021 FINDINGS: RIGHT jugular line with tip projecting over RIGHT atrium. Enlargement of cardiac silhouette with pulmonary vascular congestion. Decreased LEFT pleural effusion and basilar atelectasis post thoracentesis. No pneumothorax. Small persistent RIGHT pleural effusion and mild RIGHT basilar atelectasis. Osseous structures unremarkable. IMPRESSION: No pneumothorax following LEFT thoracentesis. Enlargement of cardiac silhouette with small bibasilar pleural effusions and atelectasis. Electronically Signed   By: Lavonia Dana M.D.   On: 08/16/2021 13:55   DG Chest Port 1 View  Result Date:  08/14/2021 CLINICAL DATA:  Anemia EXAM: PORTABLE CHEST 1 VIEW COMPARISON:  08/08/2021, CT 08/04/2021 FINDINGS: Moderate left and small right-sided pleural effusion is, grossly stable on the left, suspect small increased on the right. Cardiomegaly with vascular congestion and edema. Left greater than right basilar consolidations. IMPRESSION: 1. Cardiomegaly with vascular congestion and pulmonary edema. Moderate left and small right pleural effusion, suspect slight increase in size of right pleural effusion. Basilar airspace disease which may be due to atelectasis or pneumonia. Electronically Signed   By: Donavan Foil M.D.   On: 08/14/2021 16:55   ECHOCARDIOGRAM LIMITED  Result Date: 08/15/2021    ECHOCARDIOGRAM LIMITED REPORT   Patient Name:   Karen Farley Date of Exam: 08/15/2021 Medical Rec #:  [id number]          Height:       62.0 in Accession #:    [id number]         Weight:       224.9 lb Date of Birth:  03/22/1946           BSA:          2.009 m Patient Age:    25 years           BP:           87/48 mmHg Patient Gender: F                  HR:           68 bpm. Exam Location:  [facility] Procedure: Limited Echo, Limited Color Doppler and Cardiac Doppler Indications:     I31.3 Pericardial effusion  History:         Patient has prior history of Echocardiogram examinations, most                  recent 08/10/2021. HFpEF, CKD; Risk Factors:Diabetes,                  Hypertension and  Dyslipidemia.  Sonographer:     Charmayne Sheer Referring Phys:  [phone] CHRISTOPHER END Diagnosing Phys: Kate Sable MD  Sonographer Comments: Suboptimal apical window and suboptimal subcostal window. Image acquisition challenging due to patient body habitus. IMPRESSIONS  1. Left ventricular ejection fraction, by estimation, is 55 to 60%. The left ventricle has normal function.  2. Moderate pericardial effusion. The pericardial effusion is posterior and lateral to the left ventricle and posterior to the left ventricle and the left  atrium. There is no evidence of cardiac tamponade. Large pleural effusion in the left lateral region.  3. The inferior vena cava is dilated in size with <50% respiratory variability, suggesting right atrial pressure of 15 mmHg. FINDINGS  Left Ventricle: Left ventricular ejection fraction, by estimation, is 55 to 60%. The left ventricle has normal function. Pericardium: A moderately sized pericardial effusion is present. The pericardial effusion is posterior and lateral to the left ventricle and posterior to the left ventricle and the left atrium. There is no evidence of cardiac tamponade. Tricuspid Valve: The tricuspid valve is normal in structure. Tricuspid valve regurgitation is mild. Aortic Valve: Aortic valve mean gradient measures 10.5 mmHg. Aortic valve peak gradient measures 19.4 mmHg. Aortic valve area, by VTI measures 0.67 cm. Aorta: The aortic root is normal in size and structure. Venous: The inferior vena cava is dilated in size with less than 50% respiratory variability, suggesting right atrial pressure of 15 mmHg. Additional Comments: There is a large pleural effusion in the left lateral region. LEFT VENTRICLE PLAX 2D LVIDd:         4.41 cm LVIDs:         4.10 cm LV PW:         0.97 cm LV IVS:        0.81 cm LVOT diam:     1.70 cm LV SV:         32 LV SV Index:   16 LVOT Area:     2.27 cm  LEFT ATRIUM         Index LA diam:    5.30 cm 2.64 cm/m  AORTIC VALVE AV Area (Vmax):    0.73 cm AV Area (Vmean):   0.69 cm AV Area (VTI):     0.67 cm AV Vmax:           220.00 cm/s AV Vmean:          149.500 cm/s AV VTI:            0.472 m AV Peak Grad:      19.4 mmHg AV Mean Grad:      10.5 mmHg LVOT Vmax:         70.50 cm/s LVOT Vmean:        45.500 cm/s LVOT VTI:          0.139 m LVOT/AV VTI ratio: 0.29  AORTA Ao Root diam: 2.60 cm TRICUSPID VALVE TR Peak grad:   32.9 mmHg TR Vmax:        287.00 cm/s  SHUNTS Systemic VTI:  0.14 m Systemic Diam: 1.70 cm Kate Sable MD Electronically signed by Kate Sable MD Signature Date/Time: 08/15/2021/5:39:29 PM    Final    IR THORACENTESIS ASP PLEURAL SPACE W/IMG GUIDE  Result Date: 08/16/2021 INDICATION: Left pleural effusion EXAM: ULTRASOUND GUIDED LEFT THORACENTESIS MEDICATIONS: None. COMPLICATIONS: None immediate. PROCEDURE: An ultrasound guided thoracentesis was thoroughly discussed with the patient and questions answered. The benefits, risks, alternatives and complications were also discussed. The patient  understands and wishes to proceed with the procedure. Written consent was obtained. Ultrasound was performed to localize and mark an adequate pocket of fluid in the left chest. The area was then prepped and draped in the normal sterile fashion. 1% Lidocaine was used for local anesthesia. Under ultrasound guidance a 19 gauge Yueh catheter was introduced. Thoracentesis was performed. The catheter was removed and a dressing applied. FINDINGS: A total of approximately 700 mL of clear yellow fluid was removed. Samples were sent to the laboratory as requested by the clinical team. IMPRESSION: Successful ultrasound guided left thoracentesis yielding 700 mL of clear pleural fluid. Electronically Signed   By: Albin Felling M.D.   On: 08/16/2021 14:00        Scheduled Meds:  apixaban  5 mg Oral BID   [START ON 08/17/2021] Chlorhexidine Gluconate Cloth  6 each Topical Daily   docusate sodium  100 mg Oral Daily   fentaNYL       folic acid  1 mg Oral Daily   hydrocortisone   Rectal TID   insulin aspart  0-5 Units Subcutaneous QHS   insulin aspart  0-9 Units Subcutaneous TID WC   iron polysaccharides  150 mg Oral Daily   latanoprost  1 drop Both Eyes QHS   metoprolol tartrate  12.5 mg Oral Q6H   midazolam       midodrine  10 mg Oral TID WC   pantoprazole (PROTONIX) IV  40 mg Intravenous Q12H   polyethylene glycol  17 g Oral Daily   Ensure Max Protein  11 oz Oral BID   spironolactone  25 mg Oral Daily   vitamin B-12  500 mcg Oral Daily    Continuous Infusions:  sodium chloride 250 mL (08/13/21 1947)   furosemide (LASIX) 200 mg in dextrose 5% 100 mL (2mg /mL) infusion 8 mg/hr (08/16/21 0701)     LOS: 21 days   The patient is critically ill with multiple organ systems failure and requires high complexity decision making for assessment and support, frequent evaluation and titration of therapies, application of advanced monitoring technologies and extensive interpretation of multiple databases. Critical Care Time devoted to patient care services described in this note  Time spent: 40 minutes     Danni Leabo, Geraldo Docker, MD [facility]   If 7PM-7AM, please contact night-coverage 08/16/2021, 2:07 PM

## 2021-08-16 NOTE — Progress Notes (Signed)
Spoke with Specials. Patient to be kept npo plan for catheter placement after thoracentesis. Will continue to monitor

## 2021-08-16 NOTE — Progress Notes (Signed)
[facility]  ROUNDING NOTE   Subjective:   [name] was admitted to [facility] on 07/26/2021 for Morbid obesity ([place]) [E66.01] DOE (dyspnea on exertion) [R06.09] Recurrent left pleural effusion [J90] Hypotension [I95.9] Type 2 diabetes mellitus without complication, without long-term current use of insulin ([place]) [E11.9] Atrial fibrillation, unspecified type ([place]) [I48.91] Chronic congestive heart failure, unspecified heart failure type [facility]) [I50.9]  Patient was last seen by my partner, Dr. Candiss Norse, on 05/31/21.  Patient seen sitting up in chair, awaiting breakfast Bilateral leg wraps applied yesterday evening Feels edema is improving Recorded urine output of 4.4 L in preceding 24 hours. Creatinine increased to 2.01.  Objective:  Vital signs in last 24 hours:  Temp:  [97.5 F (36.4 C)-98.1 F (36.7 C)] 98.1 F (36.7 C) (10/27 0736) Pulse Rate:  [72-79] 79 (10/27 0736) Resp:  [17-18] 17 (10/27 0736) BP: (86-113)/(57-78) 97/61 (10/27 0736) SpO2:  [98 %-100 %] 98 % (10/27 0736) Weight:  [101.5 kg] 101.5 kg (10/27 0516)  Weight change: -0.499 kg Filed Weights   08/14/21 0446 08/15/21 0512 08/16/21 0516  Weight: 102.9 kg 102 kg 101.5 kg    Intake/Output: I/O last 3 completed shifts: In: 527.3 [P.O.:480; I.V.:47.3] Out: 4550 [Urine:4550]   Intake/Output this shift:  Total I/O In: 0  Out: 1000 [Urine:1000]  Physical Exam: General: NAD  Head: Normocephalic, atraumatic. Moist oral mucosal membranes  Eyes: Anicteric  Lungs:  Clear bilaterally, normal effort  Heart: Irregular rhythm  Abdomen:  Soft, nontender  Extremities:  ++ peripheral edema.  Bilateral leg wraps with Ace bandages  Neurologic: Nonfocal, moving all four extremities  Skin: Generalized bruising        Basic Metabolic Panel: Recent Labs  Lab 08/13/21 0414 08/13/21 0417 08/14/21 0423 08/15/21 0429 08/15/21 0906 08/15/21 1530 08/16/21 0601  NA  --  138 139 135 135  --  134*   K  --  4.2 4.0 3.3* 3.1*  --  3.4*  CL  --  99 99 96* 95*  --  95*  CO2  --  27 27 30 28   --  28  GLUCOSE  --  140* 145* 145* 159*  --  206*  BUN  --  34* 35* 33* 31*  --  40*  CREATININE  --  1.51* 1.55* 1.79* 1.72*  --  2.01*  CALCIUM  --  9.2 9.3 9.0 9.2  --  9.1  MG 2.1  --  2.2 2.2  --  2.1 2.0  PHOS 2.3* 2.3* 2.6 2.7  --   --  3.3     Liver Function Tests: Recent Labs  Lab 08/13/21 0417 08/15/21 0906 08/16/21 0601  AST  --  27 28  ALT  --  17 18  ALKPHOS  --  73 70  BILITOT  --  2.7* 2.0*  PROT  --  6.5 5.9*  ALBUMIN 3.9 4.0 3.8    No results for input(s): LIPASE, AMYLASE in the last 168 hours. No results for input(s): AMMONIA in the last 168 hours.  CBC: Recent Labs  Lab 08/10/21 0809 08/11/21 0620 08/12/21 0534 08/13/21 0414 08/14/21 0423 08/15/21 0906 08/16/21 0601  WBC 8.1 9.0 8.2 8.0 8.6 6.6 6.7  NEUTROABS 6.2 6.8  --   --   --   --   --   HGB 9.7* 9.9* 9.6* 9.8* 9.8* 10.4* 10.0*  HCT 30.4* 31.3* 30.8* 32.3* 32.4* 33.1* 32.3*  MCV 89.7 91.3 90.9 90.0 91.5 89.5 90.7  PLT 196  214 229 264 272 287 298     Cardiac Enzymes: No results for input(s): CKTOTAL, CKMB, CKMBINDEX, TROPONINI in the last 168 hours.  BNP: Invalid input(s): POCBNP  CBG: Recent Labs  Lab 08/15/21 0742 08/15/21 1134 08/15/21 1609 08/15/21 2058 08/16/21 0734  GLUCAP 118* 151* 174* 158* 133*     Microbiology: Results for orders placed or performed during the hospital encounter of 07/26/21  Resp Panel by RT-PCR (Flu A&B, Covid) Nasopharyngeal Swab     Status: None   Collection Time: 07/26/21  7:19 PM   Specimen: Nasopharyngeal Swab; Nasopharyngeal(NP) swabs in vial transport medium  Result Value Ref Range Status   SARS Coronavirus 2 by RT PCR NEGATIVE NEGATIVE Final    Comment: (NOTE) SARS-CoV-2 target nucleic acids are NOT DETECTED.  The SARS-CoV-2 RNA is generally detectable in upper respiratory specimens during the acute phase of infection. The  lowest concentration of SARS-CoV-2 viral copies this assay can detect is 138 copies/mL. A negative result does not preclude SARS-Cov-2 infection and should not be used as the sole basis for treatment or other patient management decisions. A negative result may occur with  improper specimen collection/handling, submission of specimen other than nasopharyngeal swab, presence of viral mutation(s) within the areas targeted by this assay, and inadequate number of viral copies(<138 copies/mL). A negative result must be combined with clinical observations, patient history, and epidemiological information. The expected result is Negative.  Fact Sheet for Patients:  EntrepreneurPulse.com.au  Fact Sheet for Healthcare Providers:  IncredibleEmployment.be  This test is no t yet approved or cleared by the [place] FDA and  has been authorized for detection and/or diagnosis of SARS-CoV-2 by FDA under an Emergency Use Authorization (EUA). This EUA will remain  in effect (meaning this test can be used) for the duration of the COVID-19 declaration under Section 564(b)(1) of the Act, 21 U.S.C.section 360bbb-3(b)(1), unless the authorization is terminated  or revoked sooner.       Influenza A by PCR NEGATIVE NEGATIVE Final   Influenza B by PCR NEGATIVE NEGATIVE Final    Comment: (NOTE) The Xpert Xpress SARS-CoV-2/FLU/RSV plus assay is intended as an aid in the diagnosis of influenza from Nasopharyngeal swab specimens and should not be used as a sole basis for treatment. Nasal washings and aspirates are unacceptable for Xpert Xpress SARS-CoV-2/FLU/RSV testing.  Fact Sheet for Patients: EntrepreneurPulse.com.au  Fact Sheet for Healthcare Providers: IncredibleEmployment.be  This test is not yet approved or cleared by the [place] FDA and has been authorized for detection and/or diagnosis of SARS-CoV-2 by FDA under  an Emergency Use Authorization (EUA). This EUA will remain in effect (meaning this test can be used) for the duration of the COVID-19 declaration under Section 564(b)(1) of the Act, 21 U.S.C. section 360bbb-3(b)(1), unless the authorization is terminated or revoked.  Performed at [facility], [address] [place], [place] [address]   Body fluid culture w Gram Stain     Status: None   Collection Time: 07/28/21  1:53 PM   Specimen: PATH Cytology Pleural fluid  Result Value Ref Range Status   Specimen Description   Final    PLEURAL Performed at [facility], [address] [place], [place] [address]    Special Requests   Final    NONE Performed at [facility], [address] [place], [place] [address]    Gram Stain   Final    NO SQUAMOUS EPITHELIAL CELLS SEEN FEW WBC SEEN NO ORGANISMS SEEN  Culture   Final    NO GROWTH 3 DAYS Performed at [facility], [place] [address], [place], [place] [address]    Report Status 08/01/2021 FINAL  Final  Body fluid culture w Gram Stain     Status: None   Collection Time: 08/01/21  3:36 PM   Specimen: PATH Cytology Peritoneal fluid  Result Value Ref Range Status   Specimen Description   Final    PERITONEAL Performed at [facility], [address], [place], [place] [address]    Special Requests   Final    NONE Performed at [facility], [address] [place], [place] [address]    Gram Stain   Final    RARE WBC PRESENT,BOTH PMN AND MONONUCLEAR NO ORGANISMS SEEN    Culture   Final    NO GROWTH 3 DAYS Performed at [facility], [place] [address], [place], [place] [address]    Report Status 08/05/2021 FINAL  Final  MRSA Next Gen by PCR, Nasal     Status: None   Collection Time: 08/04/21  8:56 PM   Specimen: Nasal Mucosa; Nasal Swab  Result Value Ref Range Status   MRSA by PCR Next Gen NOT DETECTED NOT DETECTED Final    Comment: (NOTE) The GeneXpert MRSA Assay (FDA  approved for NASAL specimens only), is one component of a comprehensive MRSA colonization surveillance program. It is not intended to diagnose MRSA infection nor to guide or monitor treatment for MRSA infections. Test performance is not FDA approved in patients less than 38 years old. Performed at [facility], [address] [place], [place] [address]   Body fluid culture w Gram Stain     Status: None   Collection Time: 08/05/21  5:50 PM   Specimen: Pericardial  Result Value Ref Range Status   Specimen Description   Final    PERICARDIAL Performed at [facility], [address], [place], [place] [address]    Special Requests   Final    NONE Performed at [facility], [address] [place],  [address]    Gram Stain   Final    FEW WBC PRESENT, PREDOMINANTLY MONONUCLEAR NO ORGANISMS SEEN    Culture   Final    NO GROWTH 3 DAYS Performed at [facility], [place] [address] [place],  [address]    Report Status 08/09/2021 FINAL  Final    Coagulation Studies: No results for input(s): LABPROT, INR in the last 72 hours.   Urinalysis: No results for input(s): COLORURINE, LABSPEC, PHURINE, GLUCOSEU, HGBUR, BILIRUBINUR, KETONESUR, PROTEINUR, UROBILINOGEN, NITRITE, LEUKOCYTESUR in the last 72 hours.  Invalid input(s): APPERANCEUR    Imaging: DG Chest Port 1 View  Result Date: 08/14/2021 CLINICAL DATA:  Anemia EXAM: PORTABLE CHEST 1 VIEW COMPARISON:  08/08/2021, CT 08/04/2021 FINDINGS: Moderate left and small right-sided pleural effusion is, grossly stable on the left, suspect small increased on the right. Cardiomegaly with vascular congestion and edema. Left greater than right basilar consolidations. IMPRESSION: 1. Cardiomegaly with vascular congestion and pulmonary edema. Moderate left and small right pleural effusion, suspect slight increase in size of right pleural effusion. Basilar airspace disease which may be due to atelectasis  or pneumonia. Electronically Signed   By: Donavan Foil M.D.   On: 08/14/2021 16:55   ECHOCARDIOGRAM LIMITED  Result Date: 08/15/2021    ECHOCARDIOGRAM LIMITED REPORT   Patient Name:   Karen Farley Date of Exam: 08/15/2021 Medical Rec #:  [id number]          Height:       62.0 in Accession #:    [id number]         Weight:       224.9 lb Date of Birth:  03-16-1946           BSA:          2.009 m Patient Age:    74 years           BP:           87/48 mmHg Patient Gender: F                  HR:           68 bpm. Exam Location:  [facility] Procedure: Limited Echo, Limited Color Doppler and Cardiac Doppler Indications:     I31.3 Pericardial effusion  History:         Patient has prior history of Echocardiogram examinations, most                  recent 08/10/2021. HFpEF, CKD; Risk Factors:Diabetes,                  Hypertension and Dyslipidemia.  Sonographer:     Charmayne Sheer Referring Phys:  [phone] CHRISTOPHER END Diagnosing Phys: Kate Sable MD  Sonographer Comments: Suboptimal apical window and suboptimal subcostal window. Image acquisition challenging due to patient body habitus. IMPRESSIONS  1. Left ventricular ejection fraction, by estimation, is 55 to 60%. The left ventricle has normal function.  2. Moderate pericardial effusion. The pericardial effusion is posterior and lateral to the left ventricle and posterior to the left ventricle and the left atrium. There is no evidence of cardiac tamponade. Large pleural effusion in the left lateral region.  3. The inferior vena cava is dilated in size with <50% respiratory variability, suggesting right atrial pressure of 15 mmHg. FINDINGS  Left Ventricle: Left ventricular ejection fraction, by estimation, is 55 to 60%. The left ventricle has normal function. Pericardium: A moderately sized pericardial effusion is present. The pericardial effusion is posterior and lateral to the left ventricle and posterior to the left ventricle and the left atrium. There is no  evidence of cardiac tamponade. Tricuspid Valve: The tricuspid valve is normal in structure. Tricuspid valve regurgitation is mild. Aortic Valve: Aortic valve mean gradient measures 10.5 mmHg. Aortic valve peak gradient measures 19.4 mmHg. Aortic valve area, by VTI measures 0.67 cm. Aorta: The aortic root is normal in size and structure. Venous: The inferior vena cava is dilated in size with less than 50% respiratory variability, suggesting right atrial pressure of 15 mmHg. Additional Comments: There is a large pleural effusion in the left lateral region. LEFT VENTRICLE PLAX 2D LVIDd:         4.41 cm LVIDs:         4.10 cm LV PW:         0.97 cm LV IVS:        0.81 cm LVOT diam:     1.70 cm LV SV:         32 LV SV Index:   16 LVOT Area:     2.27 cm  LEFT ATRIUM         Index LA diam:    5.30 cm 2.64 cm/m  AORTIC VALVE AV Area (Vmax):    0.73 cm AV Area (Vmean):   0.69 cm AV Area (VTI):  0.67 cm AV Vmax:           220.00 cm/s AV Vmean:          149.500 cm/s AV VTI:            0.472 m AV Peak Grad:      19.4 mmHg AV Mean Grad:      10.5 mmHg LVOT Vmax:         70.50 cm/s LVOT Vmean:        45.500 cm/s LVOT VTI:          0.139 m LVOT/AV VTI ratio: 0.29  AORTA Ao Root diam: 2.60 cm TRICUSPID VALVE TR Peak grad:   32.9 mmHg TR Vmax:        287.00 cm/s  SHUNTS Systemic VTI:  0.14 m Systemic Diam: 1.70 cm Kate Sable MD Electronically signed by Kate Sable MD Signature Date/Time: 08/15/2021/5:39:29 PM    Final      Medications:    sodium chloride 250 mL (08/13/21 1947)   furosemide (LASIX) 200 mg in dextrose 5% 100 mL (2mg /mL) infusion 8 mg/hr (08/16/21 0701)    apixaban  5 mg Oral BID   docusate sodium  100 mg Oral Daily   fentaNYL       folic acid  1 mg Oral Daily   hydrocortisone   Rectal TID   insulin aspart  0-5 Units Subcutaneous QHS   insulin aspart  0-9 Units Subcutaneous TID WC   iron polysaccharides  150 mg Oral Daily   latanoprost  1 drop Both Eyes QHS   metoprolol tartrate   12.5 mg Oral Q6H   midazolam       midodrine  10 mg Oral TID WC   pantoprazole (PROTONIX) IV  40 mg Intravenous Q12H   polyethylene glycol  17 g Oral Daily   Ensure Max Protein  11 oz Oral BID   spironolactone  25 mg Oral Daily   vitamin B-12  500 mcg Oral Daily     Assessment/ Plan:  [name] is a 75 y.o. white female with diastolic congestive heart failure, hypertension, hyperlipidemia, lymphedema, atrial fibrillation, peripheral vascular disease who is admitted to [facility] on 07/26/2021 for Morbid obesity ([place]) [E66.01] DOE (dyspnea on exertion) [R06.09] Recurrent left pleural effusion [J90] Hypotension [I95.9] Type 2 diabetes mellitus without complication, without long-term current use of insulin ([facility]) [E11.9] Atrial fibrillation, unspecified type ([facility]) [I48.91] Chronic congestive heart failure, unspecified heart failure type ([facility]) [I50.9]  Acute kidney injury on chronic kidney disease stage IIIB: with baseline creatinine of 1.66, GFR of 36 on 07/13/21. History of bland urine. 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 Hold benazepril. Creatinine remains at baseline - Continue Furosemide drip at current rate.  Recorded weight loss of 0.5 kg today -Applied leg wraps will help with edema. - Will consider Farxiga treatment once discharged.   - Continue 1258ml fluid restriction  Hypotension: BP remains soft at 97/61 Continue Midodrine and will monitor  Acute exacerbation of diastolic congestive heart failure:  - Cardiology following, Echo on 08/07/21 shows EF 55% -Fluid restriction 1219ml daily -Furosemide as above - Spironolactone 25mg  daily  Hyponatremia: with hypervolumia.   -We will continue to monitor  5. Left pleural effusion: Thoracentesis on 07/28/21 with 621ml removed. Paracentesis on 08/01/21 with 1.2L removed. Large pleural effusion seen on echo.  Pulmonology following. No intervention at this time  6. Pericardial effusion-  Pericardiocentesis performed on 08/05/21 with 1.1L removed.  Repeat echo on 08/10/21 shows a  small pericardial effusion. Cardiology following and will consider pericardial window if necessary.   LOS: [place] 10/27/202212:14 PM

## 2021-08-16 NOTE — Progress Notes (Signed)
BLE wound care done. Patient resting comfortably in recliner. Call bell within reach. No complaints at this time.

## 2021-08-16 NOTE — Progress Notes (Signed)
Progress Note  Patient Name: Karen Farley Date of Encounter: 08/16/2021  [facility] Cardiologist: Kate Sable, MD   Subjective   Tired today, had busy day Underwent thoracentesis 700 out Central line placed right subclavian Echocardiogram reviewed with her showing large left pleural effusion, moderate pericardial effusion  On Lasix infusion, -3.7 L negative yesterday, -2 L negative today Worsening renal function Nephrology following  Inpatient Medications    Scheduled Meds:  apixaban  5 mg Oral BID   [START ON 08/17/2021] Chlorhexidine Gluconate Cloth  6 each Topical Daily   docusate sodium  100 mg Oral Daily   folic acid  1 mg Oral Daily   hydrocortisone   Rectal TID   insulin aspart  0-5 Units Subcutaneous QHS   insulin aspart  0-9 Units Subcutaneous TID WC   iron polysaccharides  150 mg Oral Daily   latanoprost  1 drop Both Eyes QHS   metoprolol tartrate  12.5 mg Oral Q6H   midodrine  10 mg Oral TID WC   pantoprazole (PROTONIX) IV  40 mg Intravenous Q12H   polyethylene glycol  17 g Oral Daily   Ensure Max Protein  11 oz Oral BID   spironolactone  25 mg Oral Daily   vitamin B-12  500 mcg Oral Daily   Continuous Infusions:  sodium chloride Stopped (08/16/21 1559)   furosemide (LASIX) 200 mg in dextrose 5% 100 mL (2mg /mL) infusion 8 mg/hr (08/16/21 1508)   potassium chloride 10 mEq (08/16/21 1801)   PRN Meds: acetaminophen, bisacodyl, lidocaine HCl (PF), lip balm, traMADol   Vital Signs    Vitals:   08/16/21 0516 08/16/21 0736 08/16/21 1315 08/16/21 1539  BP:  97/61 97/65 106/66  Pulse:  79 81 74  Resp:  17  19  Temp:  98.1 F (36.7 C) 97.6 F (36.4 C) 98 F (36.7 C)  TempSrc:   Oral Oral  SpO2:  98% 97% 97%  Weight: 101.5 kg     Height:        Intake/Output Summary (Last 24 hours) at 08/16/2021 1816 Last data filed at 08/16/2021 1806 Gross per 24 hour  Intake 521.45 ml  Output 3300 ml  Net -2778.55 ml   Last 3 Weights  08/16/2021 08/15/2021 08/14/2021  Weight (lbs) 223 lb 12.8 oz 224 lb 14.4 oz 226 lb 12.8 oz  Weight (kg) 101.515 kg 102.014 kg 102.876 kg      Telemetry    Atrial fibrillation personally Reviewed  ECG    - Personally Reviewed  Physical Exam   Constitutional: Obese, appears pale, laying supine in bed, oriented to person, place, and time. No distress.  HENT:  Head: Grossly normal Eyes:  no discharge. No scleral icterus.  Neck: Unable to estimate JVD, no carotid bruits  Cardiovascular: Regular rate and rhythm, no murmurs appreciated Legs wrapped knees to the feet, 1+ pitting edema above the knees into the thighs Pulmonary/Chest: Clear to auscultation bilaterally, no wheezes or rails Abdominal: Soft.   mild distension.  no tenderness.  Musculoskeletal: Normal range of motion Neurological:  normal muscle tone. Coordination normal. No atrophy Skin: Skin warm and dry Psychiatric: normal affect, pleasant  Labs    High Sensitivity Troponin:   Recent Labs  Lab 07/26/21 1708  TROPONINIHS 17     Chemistry Recent Labs  Lab 08/13/21 0417 08/14/21 0423 08/15/21 0429 08/15/21 0906 08/15/21 1530 08/16/21 0601  NA 138   < > 135 135  --  134*  K 4.2   < >  3.3* 3.1*  --  3.4*  CL 99   < > 96* 95*  --  95*  CO2 27   < > 30 28  --  28  GLUCOSE 140*   < > 145* 159*  --  206*  BUN 34*   < > 33* 31*  --  40*  CREATININE 1.51*   < > 1.79* 1.72*  --  2.01*  CALCIUM 9.2   < > 9.0 9.2  --  9.1  MG  --    < > 2.2  --  2.1 2.0  PROT  --   --   --  6.5  --  5.9*  ALBUMIN 3.9  --   --  4.0  --  3.8  AST  --   --   --  27  --  28  ALT  --   --   --  17  --  18  ALKPHOS  --   --   --  73  --  70  BILITOT  --   --   --  2.7*  --  2.0*  GFRNONAA 36*   < > 29* 31*  --  25*  ANIONGAP 12   < > 9 12  --  11   < > = values in this interval not displayed.    Lipids No results for input(s): CHOL, TRIG, HDL, LABVLDL, LDLCALC, CHOLHDL in the last 168 hours.  Hematology Recent Labs  Lab  08/14/21 0423 08/15/21 0906 08/16/21 0601  WBC 8.6 6.6 6.7  RBC 3.54* 3.70* 3.56*  HGB 9.8* 10.4* 10.0*  HCT 32.4* 33.1* 32.3*  MCV 91.5 89.5 90.7  MCH 27.7 28.1 28.1  MCHC 30.2 31.4 31.0  RDW 26.8* 26.4* 26.5*  PLT 272 287 298   Thyroid No results for input(s): TSH, FREET4 in the last 168 hours.  BNP No results for input(s): BNP, PROBNP in the last 168 hours.   DDimer No results for input(s): DDIMER in the last 168 hours.   Radiology    IR Fluoro Guide CV Line Right  Result Date: 08/16/2021 INDICATION: Need for central venous access EXAM: Ultrasound-guided puncture of the right external jugular vein Placement of a temporary central venous catheter using fluoroscopic guidance MEDICATIONS: None ANESTHESIA/SEDATION: Local analgesia FLUOROSCOPY TIME:  Fluoroscopy Time: 18 seconds with 2 exposures COMPLICATIONS: None immediate. PROCEDURE: Informed written consent was obtained from the patient after a thorough discussion of the procedural risks, benefits and alternatives. All questions were addressed. Maximal Sterile Barrier Technique was utilized including caps, mask, sterile gowns, sterile gloves, sterile drape, hand hygiene and skin antiseptic. A timeout was performed prior to the initiation of the procedure. The patient was placed supine on the exam table. Prior to prepping the neck, limited ultrasound evaluation of the bilateral neck demonstrated no identifiable internal jugular veins. A right external jugular vein was identified and determined to be the best target for access. The right neck and chest was prepped and draped in the standard sterile fashion. Ultrasound was used to evaluate the right external jugular vein, which found to be patent and suitable for access. An ultrasound image was permanently stored in the electronic medical record. Using ultrasound guidance, the right external jugular vein was directly punctured using a 21 gauge micropuncture set. An 018 wire was advanced into  the SVC, followed by serial tract dilation and placement of a 7 French triple-lumen central venous catheter which was advanced into the central veins, such that the tip  was positioned in the right atrium. The line was found to flush and aspirate appropriately. It was sutured to the skin using 0 silk suture. A sterile dressing was placed. The patient tolerated the procedure well without immediate complication. IMPRESSION: 1. Successful placement of a 7 French triple-lumen temporary central venous catheter using the right external jugular vein. The line is ready for immediate use. 2. Of note, ultrasound evaluation of the neck demonstrates no identifiable internal jugular veins. Electronically Signed   By: Albin Felling M.D.   On: 08/16/2021 14:07   DG Chest Port 1 View  Result Date: 08/16/2021 CLINICAL DATA:  Post thoracentesis EXAM: PORTABLE CHEST 1 VIEW COMPARISON:  Portable exam 1340 hours compared to 08/14/2021 FINDINGS: RIGHT jugular line with tip projecting over RIGHT atrium. Enlargement of cardiac silhouette with pulmonary vascular congestion. Decreased LEFT pleural effusion and basilar atelectasis post thoracentesis. No pneumothorax. Small persistent RIGHT pleural effusion and mild RIGHT basilar atelectasis. Osseous structures unremarkable. IMPRESSION: No pneumothorax following LEFT thoracentesis. Enlargement of cardiac silhouette with small bibasilar pleural effusions and atelectasis. Electronically Signed   By: Lavonia Dana M.D.   On: 08/16/2021 13:55   ECHOCARDIOGRAM LIMITED  Result Date: 08/15/2021    ECHOCARDIOGRAM LIMITED REPORT   Patient Name:   Karen Farley Date of Exam: 08/15/2021 Medical Rec #:  [id number]          Height:       62.0 in Accession #:    [id number]         Weight:       224.9 lb Date of Birth:  19-Nov-1945           BSA:          2.009 m Patient Age:    39 years           BP:           87/48 mmHg Patient Gender: F                  HR:           68 bpm. Exam Location:  [facility]  Procedure: Limited Echo, Limited Color Doppler and Cardiac Doppler Indications:     I31.3 Pericardial effusion  History:         Patient has prior history of Echocardiogram examinations, most                  recent 08/10/2021. HFpEF, CKD; Risk Factors:Diabetes,                  Hypertension and Dyslipidemia.  Sonographer:     Charmayne Sheer Referring Phys:  [phone] CHRISTOPHER END Diagnosing Phys: Kate Sable MD  Sonographer Comments: Suboptimal apical window and suboptimal subcostal window. Image acquisition challenging due to patient body habitus. IMPRESSIONS  1. Left ventricular ejection fraction, by estimation, is 55 to 60%. The left ventricle has normal function.  2. Moderate pericardial effusion. The pericardial effusion is posterior and lateral to the left ventricle and posterior to the left ventricle and the left atrium. There is no evidence of cardiac tamponade. Large pleural effusion in the left lateral region.  3. The inferior vena cava is dilated in size with <50% respiratory variability, suggesting right atrial pressure of 15 mmHg. FINDINGS  Left Ventricle: Left ventricular ejection fraction, by estimation, is 55 to 60%. The left ventricle has normal function. Pericardium: A moderately sized pericardial effusion is present. The pericardial effusion is posterior and lateral to the left ventricle and  posterior to the left ventricle and the left atrium. There is no evidence of cardiac tamponade. Tricuspid Valve: The tricuspid valve is normal in structure. Tricuspid valve regurgitation is mild. Aortic Valve: Aortic valve mean gradient measures 10.5 mmHg. Aortic valve peak gradient measures 19.4 mmHg. Aortic valve area, by VTI measures 0.67 cm. Aorta: The aortic root is normal in size and structure. Venous: The inferior vena cava is dilated in size with less than 50% respiratory variability, suggesting right atrial pressure of 15 mmHg. Additional Comments: There is a large pleural effusion in the left  lateral region. LEFT VENTRICLE PLAX 2D LVIDd:         4.41 cm LVIDs:         4.10 cm LV PW:         0.97 cm LV IVS:        0.81 cm LVOT diam:     1.70 cm LV SV:         32 LV SV Index:   16 LVOT Area:     2.27 cm  LEFT ATRIUM         Index LA diam:    5.30 cm 2.64 cm/m  AORTIC VALVE AV Area (Vmax):    0.73 cm AV Area (Vmean):   0.69 cm AV Area (VTI):     0.67 cm AV Vmax:           220.00 cm/s AV Vmean:          149.500 cm/s AV VTI:            0.472 m AV Peak Grad:      19.4 mmHg AV Mean Grad:      10.5 mmHg LVOT Vmax:         70.50 cm/s LVOT Vmean:        45.500 cm/s LVOT VTI:          0.139 m LVOT/AV VTI ratio: 0.29  AORTA Ao Root diam: 2.60 cm TRICUSPID VALVE TR Peak grad:   32.9 mmHg TR Vmax:        287.00 cm/s  SHUNTS Systemic VTI:  0.14 m Systemic Diam: 1.70 cm Kate Sable MD Electronically signed by Kate Sable MD Signature Date/Time: 08/15/2021/5:39:29 PM    Final    IR THORACENTESIS ASP PLEURAL SPACE W/IMG GUIDE  Result Date: 08/16/2021 INDICATION: Left pleural effusion EXAM: ULTRASOUND GUIDED LEFT THORACENTESIS MEDICATIONS: None. COMPLICATIONS: None immediate. PROCEDURE: An ultrasound guided thoracentesis was thoroughly discussed with the patient and questions answered. The benefits, risks, alternatives and complications were also discussed. The patient understands and wishes to proceed with the procedure. Written consent was obtained. Ultrasound was performed to localize and mark an adequate pocket of fluid in the left chest. The area was then prepped and draped in the normal sterile fashion. 1% Lidocaine was used for local anesthesia. Under ultrasound guidance a 19 gauge Yueh catheter was introduced. Thoracentesis was performed. The catheter was removed and a dressing applied. FINDINGS: A total of approximately 700 mL of clear yellow fluid was removed. Samples were sent to the laboratory as requested by the clinical team. IMPRESSION: Successful ultrasound guided left thoracentesis  yielding 700 mL of clear pleural fluid. Electronically Signed   By: Albin Felling M.D.   On: 08/16/2021 14:00    Cardiac Studies   Echo  1. Left ventricular ejection fraction, by estimation, is 60 to 65%. The  left ventricle has normal function. There is mild left ventricular  hypertrophy.   2. Right ventricular  systolic function is normal. The right ventricular  size is normal. There is severely elevated pulmonary artery systolic  pressure.   3. A small pericardial effusion is present. The pericardial effusion is  posterior to the left ventricle and localized near the right atrium.   4. The aortic valve has an indeterminant number of cusps. There is  moderate calcification of the aortic valve. There is moderate thickening  of the aortic valve.   5. The inferior vena cava is dilated in size with <50% respiratory  variability, suggesting right atrial pressure of 15 mmHg.    Patient Profile     75 y.o. female with history of HFpEF, hypertension, CKD 3, permanent atrial fibrillation presenting with hypotension, GI bleed and large pericardial effusion s/p pericardial drainage placement.  Pericardial effusion, hemorrhagic -S/p pericardiocentesis and drain placement with hemorrhagic effusion -Pericardial drainage tube removed last week Initial echo with no reaccumulation of fluid, hemodynamically stable --- Repeat echocardiogram performed yesterday with moderate pericardial effusion reaccumulated.  No evidence of tamponade   2.  HFpEF, massive volume overload Presenting with Anasarca arms legs abdomen Has had aggressive Lasix throughout her hospital course, Recently transition to Lasix infusion by nephrology, now with worsening renal dysfunction -Would recommend we consider decreasing his Lasix infusion dosing -5 L in the past 36 hours, creatinine at high end of range and trending higher   3.  Permanent A. Fib Rate well controlled The medication titration limited by  hypotension Continue metoprolol tartrate 12.5 every 6, may need to decrease dosing for hypotension   4.  Hypotension/shock In the setting of presumed lower GI bleed, "blow out" 2 week ago Initially placed on pressors, this has since been weaned Now on midodrine 3 times daily Blood pressure is still not turned a corner in the past 2 weeks in the hospital  Metoprolol restarted for rate control stable hemoglobin 10    Total encounter time more than 35 minutes  Greater than 50% was spent in counseling and coordination of care with the patient   For questions or updates, please contact [place] Please consult [email or web address] for contact info under        Signed, Ida Rogue, MD  08/16/2021, 6:16 PM

## 2021-08-16 NOTE — Progress Notes (Signed)
PT Cancellation Note  Patient Details Name: MIYO AINA MRN: [id number] DOB: May 21, 1946   Cancelled Treatment:     Pt off floor at cardiac procedure. Continue next availability if pt is ok with mobility.   [name] 08/16/2021, 12:33 PM

## 2021-08-17 DIAGNOSIS — I5033 Acute on chronic diastolic (congestive) heart failure: Secondary | ICD-10-CM | POA: Diagnosis not present

## 2021-08-17 DIAGNOSIS — R0609 Other forms of dyspnea: Secondary | ICD-10-CM | POA: Diagnosis not present

## 2021-08-17 DIAGNOSIS — R188 Other ascites: Secondary | ICD-10-CM | POA: Diagnosis not present

## 2021-08-17 DIAGNOSIS — J9601 Acute respiratory failure with hypoxia: Secondary | ICD-10-CM | POA: Diagnosis not present

## 2021-08-17 LAB — COMPREHENSIVE METABOLIC PANEL
ALT: 21 U/L (ref 0–44)
AST: 34 U/L (ref 15–41)
Albumin: 3.8 g/dL (ref 3.5–5.0)
Alkaline Phosphatase: 77 U/L (ref 38–126)
Anion gap: 14 (ref 5–15)
BUN: 40 mg/dL — ABNORMAL HIGH (ref 8–23)
CO2: 31 mmol/L (ref 22–32)
Calcium: 9.5 mg/dL (ref 8.9–10.3)
Chloride: 90 mmol/L — ABNORMAL LOW (ref 98–111)
Creatinine, Ser: 2.06 mg/dL — ABNORMAL HIGH (ref 0.44–1.00)
GFR, Estimated: 25 mL/min — ABNORMAL LOW (ref >60)
Glucose, Bld: 206 mg/dL — ABNORMAL HIGH (ref 70–99)
Potassium: 2.8 mmol/L — ABNORMAL LOW (ref 3.5–5.1)
Sodium: 135 mmol/L (ref 135–145)
Total Bilirubin: 2.4 mg/dL — ABNORMAL HIGH (ref 0.3–1.2)
Total Protein: 6.3 g/dL — ABNORMAL LOW (ref 6.5–8.1)

## 2021-08-17 LAB — IRON AND TIBC
Iron: 35 ug/dL (ref 28–170)
Saturation Ratios: 10 % — ABNORMAL LOW (ref 10.4–31.8)
TIBC: 346 ug/dL (ref 250–450)
UIBC: 311 ug/dL

## 2021-08-17 LAB — ACID FAST SMEAR (AFB, MYCOBACTERIA): Acid Fast Smear: NEGATIVE

## 2021-08-17 LAB — CBC
HCT: 31.1 % — ABNORMAL LOW (ref 36.0–46.0)
Hemoglobin: 10.1 g/dL — ABNORMAL LOW (ref 12.0–15.0)
MCH: 28.6 pg (ref 26.0–34.0)
MCHC: 32.5 g/dL (ref 30.0–36.0)
MCV: 88.1 fL (ref 80.0–100.0)
Platelets: 369 10*3/uL (ref 150–400)
RBC: 3.53 MIL/uL — ABNORMAL LOW (ref 3.87–5.11)
RDW: 26.4 % — ABNORMAL HIGH (ref 11.5–15.5)
WBC: 6.2 10*3/uL (ref 4.0–10.5)
nRBC: 0 % (ref 0.0–0.2)

## 2021-08-17 LAB — RETICULOCYTES
Immature Retic Fract: 26 % — ABNORMAL HIGH (ref 2.3–15.9)
RBC.: 3.52 MIL/uL — ABNORMAL LOW (ref 3.87–5.11)
Retic Count, Absolute: 186.6 10*3/uL — ABNORMAL HIGH (ref 19.0–186.0)
Retic Ct Pct: 5.3 % — ABNORMAL HIGH (ref 0.4–3.1)

## 2021-08-17 LAB — MAGNESIUM: Magnesium: 1.8 mg/dL (ref 1.7–2.4)

## 2021-08-17 LAB — GLUCOSE, CAPILLARY
Glucose-Capillary: 135 mg/dL — ABNORMAL HIGH (ref 70–99)
Glucose-Capillary: 212 mg/dL — ABNORMAL HIGH (ref 70–99)
Glucose-Capillary: 158 mg/dL — ABNORMAL HIGH (ref 70–99)
Glucose-Capillary: 188 mg/dL — ABNORMAL HIGH (ref 70–99)

## 2021-08-17 LAB — PROTEIN, BODY FLUID (OTHER)
Source of Sample: <3
Total Protein, Body Fluid Other: 1.9 g/dL

## 2021-08-17 LAB — FERRITIN: Ferritin: 149 ng/mL (ref 11–307)

## 2021-08-17 LAB — PHOSPHORUS: Phosphorus: 2.9 mg/dL (ref 2.5–4.6)

## 2021-08-17 LAB — CYTOLOGY - NON PAP

## 2021-08-17 LAB — FOLATE: Folate: 28 ng/mL (ref >5.9)

## 2021-08-17 MED ORDER — POTASSIUM CHLORIDE CRYS ER 20 MEQ PO TBCR
20.0000 meq | EXTENDED_RELEASE_TABLET | Freq: Once | ORAL | Status: AC
  Administered 2021-08-18: 20 meq via ORAL
  Filled 2021-08-17: qty 2

## 2021-08-17 MED ORDER — POTASSIUM CHLORIDE 10 MEQ/50ML IV SOLN
10.0000 meq | INTRAVENOUS | Status: AC
  Administered 2021-08-18 (×2): 10 meq via INTRAVENOUS
  Filled 2021-08-17 (×6): qty 50

## 2021-08-17 MED ORDER — MAGNESIUM SULFATE 50 % IJ SOLN
3.0000 g | Freq: Once | INTRAVENOUS | Status: AC
  Administered 2021-08-18: 3 g via INTRAVENOUS
  Filled 2021-08-17: qty 6

## 2021-08-17 MED ORDER — POTASSIUM CHLORIDE 20 MEQ PO PACK
40.0000 meq | PACK | ORAL | Status: AC
Start: 2021-08-17 — End: 2021-08-18
  Administered 2021-08-17: 40 meq via ORAL
  Filled 2021-08-17: qty 2

## 2021-08-17 NOTE — Progress Notes (Signed)
[facility]  ROUNDING NOTE   Subjective:   [name] was admitted to [facility] on 07/26/2021 for Morbid obesity ([place]) [E66.01] DOE (dyspnea on exertion) [R06.09] Recurrent left pleural effusion [J90] Hypotension [I95.9] Type 2 diabetes mellitus without complication, without long-term current use of insulin ([place]) [E11.9] Atrial fibrillation, unspecified type ([place]) [I48.91] Chronic congestive heart failure, unspecified heart failure type [facility]) [I50.9]  Patient was last seen by my partner, Dr. Candiss Norse, on 05/31/21.  Patient seen sitting at side of bed, appears well this morning Tolerating meals and denies shortness of breath Feels edema is improving  Recorded urine output of 2.5 L on day shift yesterday. Daily standing weight indicates 3.5 kg weight loss. Leg wraps remain in place, edema improved  Objective:  Vital signs in last 24 hours:  Temp:  [97.6 F (36.4 C)-98.3 F (36.8 C)] 98 F (36.7 C) (10/28 0806) Pulse Rate:  [62-90] 90 (10/28 0806) Resp:  [17-20] 17 (10/28 0806) BP: (85-106)/(50-71) 96/63 (10/28 0806) SpO2:  [96 %-100 %] 98 % (10/28 0806) Weight:  [98 kg] 98 kg (10/28 0341)  Weight change: -3.493 kg Filed Weights   08/15/21 0512 08/16/21 0516 08/17/21 0341  Weight: 102 kg 101.5 kg 98 kg    Intake/Output: I/O last 3 completed shifts: In: 521.5 [I.V.:415; IV Piggyback:106.5] Out: 3300 [Urine:3300]   Intake/Output this shift:  Total I/O In: 480 [P.O.:480] Out: 650 [Urine:650]  Physical Exam: General: NAD  Head: Normocephalic, atraumatic. Moist oral mucosal membranes  Eyes: Anicteric  Lungs:  Clear bilaterally, normal effort  Heart: Irregular rhythm  Abdomen:  Soft, nontender  Extremities:  ++ peripheral edema.  Bilateral leg wraps with Ace bandages  Neurologic: Nonfocal, moving all four extremities  Skin: Generalized bruising        Basic Metabolic Panel: Recent Labs  Lab 08/13/21 0414 08/13/21 0417 08/14/21 0423  08/15/21 0429 08/15/21 0906 08/15/21 1530 08/16/21 0601  NA  --  138 139 135 135  --  134*  K  --  4.2 4.0 3.3* 3.1*  --  3.4*  CL  --  99 99 96* 95*  --  95*  CO2  --  27 27 30 28   --  28  GLUCOSE  --  140* 145* 145* 159*  --  206*  BUN  --  34* 35* 33* 31*  --  40*  CREATININE  --  1.51* 1.55* 1.79* 1.72*  --  2.01*  CALCIUM  --  9.2 9.3 9.0 9.2  --  9.1  MG 2.1  --  2.2 2.2  --  2.1 2.0  PHOS 2.3* 2.3* 2.6 2.7  --   --  3.3     Liver Function Tests: Recent Labs  Lab 08/13/21 0417 08/15/21 0906 08/16/21 0601  AST  --  27 28  ALT  --  17 18  ALKPHOS  --  73 70  BILITOT  --  2.7* 2.0*  PROT  --  6.5 5.9*  ALBUMIN 3.9 4.0 3.8    No results for input(s): LIPASE, AMYLASE in the last 168 hours. No results for input(s): AMMONIA in the last 168 hours.  CBC: Recent Labs  Lab 08/11/21 0620 08/12/21 0534 08/13/21 0414 08/14/21 0423 08/15/21 0906 08/16/21 0601  WBC 9.0 8.2 8.0 8.6 6.6 6.7  NEUTROABS 6.8  --   --   --   --   --   HGB 9.9* 9.6* 9.8* 9.8* 10.4* 10.0*  HCT 31.3* 30.8* 32.3* 32.4* 33.1* 32.3*  MCV 91.3 90.9 90.0 91.5 89.5 90.7  PLT 214 229 264 272 287 298     Cardiac Enzymes: No results for input(s): CKTOTAL, CKMB, CKMBINDEX, TROPONINI in the last 168 hours.  BNP: Invalid input(s): POCBNP  CBG: Recent Labs  Lab 08/16/21 0734 08/16/21 1313 08/16/21 1623 08/16/21 2044 08/17/21 0807  GLUCAP 133* 117* 112* 151* 135*     Microbiology: Results for orders placed or performed during the hospital encounter of 07/26/21  Resp Panel by RT-PCR (Flu A&B, Covid) Nasopharyngeal Swab     Status: None   Collection Time: 07/26/21  7:19 PM   Specimen: Nasopharyngeal Swab; Nasopharyngeal(NP) swabs in vial transport medium  Result Value Ref Range Status   SARS Coronavirus 2 by RT PCR NEGATIVE NEGATIVE Final    Comment: (NOTE) SARS-CoV-2 target nucleic acids are NOT DETECTED.  The SARS-CoV-2 RNA is generally detectable in upper respiratory specimens during  the acute phase of infection. The lowest concentration of SARS-CoV-2 viral copies this assay can detect is 138 copies/mL. A negative result does not preclude SARS-Cov-2 infection and should not be used as the sole basis for treatment or other patient management decisions. A negative result may occur with  improper specimen collection/handling, submission of specimen other than nasopharyngeal swab, presence of viral mutation(s) within the areas targeted by this assay, and inadequate number of viral copies(<138 copies/mL). A negative result must be combined with clinical observations, patient history, and epidemiological information. The expected result is Negative.  Fact Sheet for Patients:  EntrepreneurPulse.com.au  Fact Sheet for Healthcare Providers:  IncredibleEmployment.be  This test is no t yet approved or cleared by the [place] FDA and  has been authorized for detection and/or diagnosis of SARS-CoV-2 by FDA under an Emergency Use Authorization (EUA). This EUA will remain  in effect (meaning this test can be used) for the duration of the COVID-19 declaration under Section 564(b)(1) of the Act, 21 U.S.C.section 360bbb-3(b)(1), unless the authorization is terminated  or revoked sooner.       Influenza A by PCR NEGATIVE NEGATIVE Final   Influenza B by PCR NEGATIVE NEGATIVE Final    Comment: (NOTE) The Xpert Xpress SARS-CoV-2/FLU/RSV plus assay is intended as an aid in the diagnosis of influenza from Nasopharyngeal swab specimens and should not be used as a sole basis for treatment. Nasal washings and aspirates are unacceptable for Xpert Xpress SARS-CoV-2/FLU/RSV testing.  Fact Sheet for Patients: EntrepreneurPulse.com.au  Fact Sheet for Healthcare Providers: IncredibleEmployment.be  This test is not yet approved or cleared by the [place] FDA and has been authorized for detection and/or  diagnosis of SARS-CoV-2 by FDA under an Emergency Use Authorization (EUA). This EUA will remain in effect (meaning this test can be used) for the duration of the COVID-19 declaration under Section 564(b)(1) of the Act, 21 U.S.C. section 360bbb-3(b)(1), unless the authorization is terminated or revoked.  Performed at [facility], [address] [place], [place] [address]   Body fluid culture w Gram Stain     Status: None   Collection Time: 07/28/21  1:53 PM   Specimen: PATH Cytology Pleural fluid  Result Value Ref Range Status   Specimen Description   Final    PLEURAL Performed at [facility], [address], [place], [place] [address]    Special Requests   Final    NONE Performed at [facility], [address] [place], [place] [address]    Gram Stain   Final    NO SQUAMOUS EPITHELIAL  CELLS SEEN FEW WBC SEEN NO ORGANISMS SEEN    Culture   Final    NO GROWTH 3 DAYS Performed at [facility], [place] [address], [place], [place] [address]    Report Status 08/01/2021 FINAL  Final  Body fluid culture w Gram Stain     Status: None   Collection Time: 08/01/21  3:36 PM   Specimen: PATH Cytology Peritoneal fluid  Result Value Ref Range Status   Specimen Description   Final    PERITONEAL Performed at [facility], [address], [place], [place] [address]    Special Requests   Final    NONE Performed at [facility], [address] [place], [place] [address]    Gram Stain   Final    RARE WBC PRESENT,BOTH PMN AND MONONUCLEAR NO ORGANISMS SEEN    Culture   Final    NO GROWTH 3 DAYS Performed at [facility], [place] [address], [place], [place] [address]    Report Status 08/05/2021 FINAL  Final  MRSA Next Gen by PCR, Nasal     Status: None   Collection Time: 08/04/21  8:56 PM   Specimen: Nasal Mucosa; Nasal Swab  Result Value Ref Range Status   MRSA by PCR Next Gen NOT DETECTED NOT DETECTED Final    Comment:  (NOTE) The GeneXpert MRSA Assay (FDA approved for NASAL specimens only), is one component of a comprehensive MRSA colonization surveillance program. It is not intended to diagnose MRSA infection nor to guide or monitor treatment for MRSA infections. Test performance is not FDA approved in patients less than 36 years old. Performed at [facility], [address] [place], [place] [address]   Body fluid culture w Gram Stain     Status: None   Collection Time: 08/05/21  5:50 PM   Specimen: Pericardial  Result Value Ref Range Status   Specimen Description   Final    PERICARDIAL Performed at [facility], [address], [place], [place] [address]    Special Requests   Final    NONE Performed at [facility], [address] [place], [place] [address]    Gram Stain   Final    FEW WBC PRESENT, PREDOMINANTLY MONONUCLEAR NO ORGANISMS SEEN    Culture   Final    NO GROWTH 3 DAYS Performed at [facility], [place] [address], [place], [place] [address]    Report Status 08/09/2021 FINAL  Final  Body fluid culture w Gram Stain     Status: None (Preliminary result)   Collection Time: 08/16/21 11:46 AM   Specimen: PATH Cytology Pleural fluid  Result Value Ref Range Status   Specimen Description   Final    PLEURAL Performed at [facility], [address], [place], [place] [address]    Special Requests   Final    NONE Performed at [facility], [address] [place], [place] [address]    Gram Stain   Final    WBC PRESENT, PREDOMINANTLY MONONUCLEAR NO ORGANISMS SEEN CYTOSPIN SMEAR    Culture   Final    NO GROWTH < 24 HOURS Performed at [facility], [place] [address] [place],  [address]    Report Status PENDING  Incomplete    Coagulation Studies: No results for input(s): LABPROT, INR in the last 72 hours.   Urinalysis: No results for input(s): COLORURINE, LABSPEC, [place], [place], [place], [place],  [place], [place], [place],  NITRITE, LEUKOCYTESUR in the last 72 hours.  Invalid input(s): APPERANCEUR    Imaging: IR Fluoro Guide CV Line Right  Result Date: 08/16/2021 INDICATION: Need for central venous access EXAM: Ultrasound-guided puncture of the right external jugular vein Placement of a temporary central venous catheter using fluoroscopic guidance MEDICATIONS: None ANESTHESIA/SEDATION: Local analgesia FLUOROSCOPY TIME:  Fluoroscopy Time: 18 seconds with 2 exposures COMPLICATIONS: None immediate. PROCEDURE: Informed written consent was obtained from the patient after a thorough discussion of the procedural risks, benefits and alternatives. All questions were addressed. Maximal Sterile Barrier Technique was utilized including caps, mask, sterile gowns, sterile gloves, sterile drape, hand hygiene and skin antiseptic. A timeout was performed prior to the initiation of the procedure. The patient was placed supine on the exam table. Prior to prepping the neck, limited ultrasound evaluation of the bilateral neck demonstrated no identifiable internal jugular veins. A right external jugular vein was identified and determined to be the best target for access. The right neck and chest was prepped and draped in the standard sterile fashion. Ultrasound was used to evaluate the right external jugular vein, which found to be patent and suitable for access. An ultrasound image was permanently stored in the electronic medical record. Using ultrasound guidance, the right external jugular vein was directly punctured using a 21 gauge micropuncture set. An 018 wire was advanced into the SVC, followed by serial tract dilation and placement of a 7 French triple-lumen central venous catheter which was advanced into the central veins, such that the tip was positioned in the right atrium. The line was found to flush and aspirate appropriately. It was sutured to the skin using 0 silk suture. A sterile dressing was  placed. The patient tolerated the procedure well without immediate complication. IMPRESSION: 1. Successful placement of a 7 French triple-lumen temporary central venous catheter using the right external jugular vein. The line is ready for immediate use. 2. Of note, ultrasound evaluation of the neck demonstrates no identifiable internal jugular veins. Electronically Signed   By: Albin Felling M.D.   On: 08/16/2021 14:07   DG Chest Port 1 View  Result Date: 08/16/2021 CLINICAL DATA:  Post thoracentesis EXAM: PORTABLE CHEST 1 VIEW COMPARISON:  Portable exam 1340 hours compared to 08/14/2021 FINDINGS: RIGHT jugular line with tip projecting over RIGHT atrium. Enlargement of cardiac silhouette with pulmonary vascular congestion. Decreased LEFT pleural effusion and basilar atelectasis post thoracentesis. No pneumothorax. Small persistent RIGHT pleural effusion and mild RIGHT basilar atelectasis. Osseous structures unremarkable. IMPRESSION: No pneumothorax following LEFT thoracentesis. Enlargement of cardiac silhouette with small bibasilar pleural effusions and atelectasis. Electronically Signed   By: Lavonia Dana M.D.   On: 08/16/2021 13:55   IR THORACENTESIS ASP PLEURAL SPACE W/IMG GUIDE  Result Date: 08/16/2021 INDICATION: Left pleural effusion EXAM: ULTRASOUND GUIDED LEFT THORACENTESIS MEDICATIONS: None. COMPLICATIONS: None immediate. PROCEDURE: An ultrasound guided thoracentesis was thoroughly discussed with the patient and questions answered. The benefits, risks, alternatives and complications were also discussed. The patient understands and wishes to proceed with the procedure. Written consent was obtained. Ultrasound was performed to localize and mark an adequate pocket of fluid in the left chest. The area was then prepped and draped in the normal sterile fashion. 1% Lidocaine was used for local anesthesia. Under ultrasound guidance a 19 gauge Yueh catheter was introduced. Thoracentesis was performed. The  catheter was removed and a dressing applied. FINDINGS: A total of approximately 700 mL of clear yellow fluid was removed. Samples were sent to the laboratory as requested  by the clinical team. IMPRESSION: Successful ultrasound guided left thoracentesis yielding 700 mL of clear pleural fluid. Electronically Signed   By: Albin Felling M.D.   On: 08/16/2021 14:00     Medications:    sodium chloride Stopped (08/16/21 1559)   furosemide (LASIX) 200 mg in dextrose 5% 100 mL (2mg /mL) infusion 8 mg/hr (08/17/21 0923)    apixaban  5 mg Oral BID   Chlorhexidine Gluconate Cloth  6 each Topical Daily   docusate sodium  100 mg Oral Daily   folic acid  1 mg Oral Daily   hydrocortisone   Rectal TID   insulin aspart  0-5 Units Subcutaneous QHS   insulin aspart  0-9 Units Subcutaneous TID WC   iron polysaccharides  150 mg Oral Daily   latanoprost  1 drop Both Eyes QHS   metoprolol tartrate  12.5 mg Oral Q6H   midodrine  10 mg Oral TID WC   pantoprazole (PROTONIX) IV  40 mg Intravenous Q12H   polyethylene glycol  17 g Oral Daily   Ensure Max Protein  11 oz Oral BID   spironolactone  25 mg Oral Daily   vitamin B-12  500 mcg Oral Daily     Assessment/ Plan:  [name] is a 75 y.o. white female with diastolic congestive heart failure, hypertension, hyperlipidemia, lymphedema, atrial fibrillation, peripheral vascular disease who is admitted to [facility] on 07/26/2021 for Morbid obesity ([place]) [E66.01] DOE (dyspnea on exertion) [R06.09] Recurrent left pleural effusion [J90] Hypotension [I95.9] Type 2 diabetes mellitus without complication, without long-term current use of insulin ([place]) [E11.9] Atrial fibrillation, unspecified type ([place]) [I48.91] Chronic congestive heart failure, unspecified heart failure type ([place]) [I50.9]  Acute kidney injury on chronic kidney disease stage IIIB: with baseline creatinine of 1.66, GFR of 36 on 07/13/21. History of bland urine. 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 Hold benazepril. Creatinine remains at baseline - Continue Furosemide drip at current rate.  Recorded weight loss of 3.5 kg.  Adequate urine output.  Continue current therapies with 1200 mL fluid restriction -Leg wraps in place - Will consider Farxiga treatment once discharged.     Hypotension: BP remains soft but stable for this patient. continue Midodrine and will monitor  Acute exacerbation of diastolic congestive heart failure:  - Cardiology following, Echo on 08/07/21 shows EF 55% -Fluid restriction 1211ml daily -Furosemide as above - Spironolactone 25mg  daily  Hyponatremia: with hypervolumia.   -We will continue to monitor  5. Left pleural effusion: Thoracentesis on 07/28/21 with 648ml removed. Paracentesis on 08/01/21 with 1.2L removed. Large pleural effusion seen on echo.  Pulmonology following. No intervention at this time  6. Pericardial effusion- Pericardiocentesis performed on 08/05/21 with 1.1L removed.  Repeat echo on 08/10/21 shows a small pericardial effusion. Cardiology following and will consider pericardial window if necessary.   LOS: [place] 10/28/202211:34 AM

## 2021-08-17 NOTE — Progress Notes (Signed)
PROGRESS NOTE    Karen Farley  LKG:[id number] DOB: 07-Oct-1946 DOA: 07/26/2021 PCP: Valerie Roys, DO   Brief Narrative:  75 y.o. WF PMHx HFpEF, permanent atrial fibrillation on Eliquis, PAD, type 2 diabetes, CKD stage IIIb, lymphedema   Presented to hospital with increasing shortness of breath and edema.  Patient has been noticing increasing abdominal distention since her admission in August with severe lower extremity edema and dyspnea and exertion.   Subjective: 10/28    A/O x4.  States feels much more comfortable s/p thoracentesis.Discharged on 27 May 2021, weighed 218 pounds (98.8 kg).   Assessment & Plan:  Covid vaccination; vaccinated 3/3  Principal Problem:   Acute blood loss anemia Active Problems:   Type 2 diabetes mellitus with renal complication (HCC)   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 diabetes mellitus (HCC)   Atrial fibrillation (HCC)   Acute heart failure with preserved ejection fraction (HFpEF) (HCC)   Pleural effusion   Acute on chronic diastolic CHF (congestive heart failure) (HCC)   Chronic anticoagulation   Shock circulatory (HCC)   Pericardial effusion  GI bleed with hemorrhagic shock. --Patient was noted to have significant drop in hemoglobin to 9.7 from 12.4 and was transferred to the ICU.   --GI Dr Alice Reichert from 10/19-- May forego endoluminal evaluation for the near future given lack of bleeding recurrence and persistence of cardiopulmonary issues. Patient is in agreement. --10/23-- d/w Dr Russo--no Rectal bleed. Ok to resume po eliquis   Large pericardial effusion  -S/p percardiocentesis  Large Pleural effusion s/p thoracentesis Patient underwent a pericardiocentesis on 08/05/2021 with removal of 700 mL of pleural fluid.  Inflammatory markers were unremarkable.  Cardiology monitoring the patient while in the hospital.  Still on vasopressors due to hypotension. --repeat echo 10/21--no pericaridal effusion  Acute on chronic diastolic CHF -Strict  in and out -11.8 kg (10/28) - Daily weight Filed Weights   08/15/21 0512 08/16/21 0516 08/17/21 0341  Weight: 102 kg 101.5 kg 98 kg         Severe Anasarca -status post thoracocentesis with removal of 600 mL of fluid on 07/28/2021. --10/20-- torsemide 20 mg qd with IV albumin --10/21-- IV lqasix 40 mg bid per cardiology --10/22 cont IV lasix--good uop --10/23--IV lasix --10/24-- Dr Juleen China wants to start IV lasxi gtt --10/25-cont Lasix gtt     Hypotension suspect  -Secondary to hemorrhagic shock and low intravsacular volume --Antihypertensives on hold.   --cont po  midodrine.   --IV Albumin       Acute kidney injury on CKD stage 3b(Baseline Cr 1.4-1.7) Lab Results  Component Value Date   CREATININE 2.01 (H) 08/16/2021   CREATININE 1.72 (H) 08/15/2021   CREATININE 1.79 (H) 08/15/2021   CREATININE 1.55 (H) 08/14/2021   CREATININE 1.51 (H) 08/13/2021  - .  Currently at baseline.   --Nephrology followed the patient during hospitalization.  -- Benazepril and spironolactone on hold.   Permanent atrial fibrillation --Was previously on Eliquis.  Currently on hold due to GI bleed.  -- Rate controlled at this time--not on any po rate controlling meds --10/21--started on IV heparin gtt--monitor hgb  --10/22-- hgb remains stable --10/23-- discussed with G.I. Dr. Virgina Jock and cardiology Dr. Rockey Situ plan is to move forward with starting eliquis while patient is in-house to make sure she tolerates. DC heparin drip. Patient is in agreement with plan. -10/24--Hgb stable   Type 2 diabetes mellitus Diet controlled at home.   --Continue sliding scale insulin while in the hospital.  Iron deficiency/Vitamin B12  --patient received Venofer -cont oral iron and vitamin B12 supplements.  Hypokalemia -Potassium goal> 4 - 10/28 Potassium 80 mEq  Hypomagnesmia - Magnesium goal> 2 - 10/28 Magnesium IV 3 g   Morbidly Obese  Body mass index is 39.53 kg/m.     DVT prophylaxis:  Eliquis (held for thoracentesis) Code Status: Full Family Communication: 10/27 husband at bedside for discussion plan of care all questions answered Status is: Inpatient    Dispo: The patient is from: Home              Anticipated d/c is to: Home              Anticipated d/c date is: > 3 days              Patient currently is not medically stable to d/c.      Consultants:  Nephrology IR    Procedures/Significant Events:  10/8 s/p left thoracentesis: 670ml aspirated 10/12 paracentesis 1.2L aspirated .  10/16 Echocardiogram performed: Pericardiocentesis 798ml aspirated and drain placed  Failed Lasix IV advanced to Lasix gtt; however, Lasix and Eliquis stopped 10/15 and patient transferred to ICU after had episode of GI bleed with drop in hgb, s/p 2U pRBC and vasopressor support initiated.    10/21 Echocardiogram Left Ventricle: Left ventricular ejection fraction, by estimation, is 60  to 65%. The left ventricle has normal function. There is mild left  ventricular hypertrophy.   Right Ventricle: The right ventricular size is normal. No increase in  right ventricular wall thickness. Right ventricular systolic function is  normal. There is severely elevated pulmonary artery systolic pressure. The  tricuspid regurgitant velocity is  3.42 m/s, and with an assumed right atrial pressure of 15 mmHg, the  estimated right ventricular systolic pressure is 20.2 mmHg.   Pericardium: A small pericardial effusion is present. The pericardial  effusion is posterior to the left ventricle and localized near the right  atrium. Presence of pericardial fat pad.   Mitral Valve: There is mild thickening of the mitral valve leaflet(s).  Mild mitral annular calcification. MV peak gradient, 15.1 mmHg. The mean  mitral valve gradient is 7.0 mmHg.   Tricuspid Valve: The tricuspid valve is not well visualized.   Aortic Valve: The aortic valve has an indeterminant number of cusps. There  is  moderate calcification of the aortic valve. There is moderate  thickening of the aortic valve.   Venous: The inferior vena cava is dilated in size with less than 50%  respiratory variability, suggesting right atrial pressure of 15 mmHg.  10/27 LEFT thoracentesis: Aspirated 700 mL of clear yellow fluid was removed      I have personally reviewed and interpreted all radiology studies and my findings are as above.  VENTILATOR SETTINGS:    Cultures   Antimicrobials: Anti-infectives (From admission, onward)    None         Devices    LINES / TUBES:      Continuous Infusions:  sodium chloride Stopped (08/16/21 1559)   furosemide (LASIX) 200 mg in dextrose 5% 100 mL (2mg /mL) infusion 8 mg/hr (08/17/21 0923)     Objective: Vitals:   08/17/21 0255 08/17/21 0341 08/17/21 0806 08/17/21 1142  BP: (!) 89/60  96/63 101/71  Pulse: 78  90 93  Resp: 18  17 16   Temp: 98 F (36.7 C)  98 F (36.7 C) 98.1 F (36.7 C)  TempSrc: Oral     SpO2: 96%  98% 100%  Weight:  98 kg    Height:        Intake/Output Summary (Last 24 hours) at 08/17/2021 1406 Last data filed at 08/17/2021 1000 Gross per 24 hour  Intake 1001.45 ml  Output 2150 ml  Net -1148.55 ml    Filed Weights   08/15/21 0512 08/16/21 0516 08/17/21 0341  Weight: 102 kg 101.5 kg 98 kg    Examination:  General: A/O x4 No acute respiratory distress Eyes: negative scleral hemorrhage, negative anisocoria, negative icterus ENT: Negative Runny nose, negative gingival bleeding, Neck:  Negative scars, masses, torticollis, lymphadenopathy, JVD Lungs: Clear to auscultation bilaterally without wheezes or crackles Cardiovascular: Regular rate and rhythm without murmur gallop or rub normal S1 and S2 Abdomen: Positive MORBID OBESITY, positive ANASARCA abdominal pain, nondistended, positive soft, bowel sounds, no rebound, no ascites, no appreciable mass Extremities: POSITIVE bilateral lower extremity edema  (ANASARCA) Skin: Negative rashes, lesions, ulcers Psychiatric:  Negative depression, negative anxiety, negative fatigue, negative mania  Central nervous system:  Cranial nerves II through XII intact, tongue/uvula midline, all extremities muscle strength 5/5, sensation intact throughout, negative dysarthria, negative expressive aphasia, negative receptive aphasia.  .     Data Reviewed: Care during the described time interval was provided by me .  I have reviewed this patient's available data, including medical history, events of note, physical examination, and all test results as part of my evaluation.   CBC: Recent Labs  Lab 08/11/21 0620 08/12/21 0534 08/13/21 0414 08/14/21 0423 08/15/21 0906 08/16/21 0601  WBC 9.0 8.2 8.0 8.6 6.6 6.7  NEUTROABS 6.8  --   --   --   --   --   HGB 9.9* 9.6* 9.8* 9.8* 10.4* 10.0*  HCT 31.3* 30.8* 32.3* 32.4* 33.1* 32.3*  MCV 91.3 90.9 90.0 91.5 89.5 90.7  PLT 214 229 264 272 287 878    Basic Metabolic Panel: Recent Labs  Lab 08/13/21 0414 08/13/21 0417 08/14/21 0423 08/15/21 0429 08/15/21 0906 08/15/21 1530 08/16/21 0601  NA  --  138 139 135 135  --  134*  K  --  4.2 4.0 3.3* 3.1*  --  3.4*  CL  --  99 99 96* 95*  --  95*  CO2  --  27 27 30 28   --  28  GLUCOSE  --  140* 145* 145* 159*  --  206*  BUN  --  34* 35* 33* 31*  --  40*  CREATININE  --  1.51* 1.55* 1.79* 1.72*  --  2.01*  CALCIUM  --  9.2 9.3 9.0 9.2  --  9.1  MG 2.1  --  2.2 2.2  --  2.1 2.0  PHOS 2.3* 2.3* 2.6 2.7  --   --  3.3    GFR: Estimated Creatinine Clearance: 26.5 mL/min (A) (by C-G formula based on SCr of 2.01 mg/dL (H)). Liver Function Tests: Recent Labs  Lab 08/13/21 0417 08/15/21 0906 08/16/21 0601  AST  --  27 28  ALT  --  17 18  ALKPHOS  --  73 70  BILITOT  --  2.7* 2.0*  PROT  --  6.5 5.9*  ALBUMIN 3.9 4.0 3.8    No results for input(s): LIPASE, AMYLASE in the last 168 hours. No results for input(s): AMMONIA in the last 168 hours. Coagulation  Profile: Recent Labs  Lab 08/10/21 1412  INR 1.4*    Cardiac Enzymes: No results for input(s): CKTOTAL, CKMB, CKMBINDEX, TROPONINI in the last 168 hours. BNP (last  3 results) No results for input(s): PROBNP in the last 8760 hours. HbA1C: No results for input(s): HGBA1C in the last 72 hours. CBG: Recent Labs  Lab 08/16/21 1313 08/16/21 1623 08/16/21 2044 08/17/21 0807 08/17/21 1145  GLUCAP 117* 112* 151* 135* 212*    Lipid Profile: No results for input(s): CHOL, HDL, LDLCALC, TRIG, CHOLHDL, LDLDIRECT in the last 72 hours. Thyroid Function Tests: No results for input(s): TSH, T4TOTAL, FREET4, T3FREE, THYROIDAB in the last 72 hours. Anemia Panel: No results for input(s): VITAMINB12, FOLATE, FERRITIN, TIBC, IRON, RETICCTPCT in the last 72 hours. Urine analysis:    Component Value Date/Time   COLORURINE YELLOW (A) 04/11/2021 1215   APPEARANCEUR HAZY (A) 04/11/2021 1215   APPEARANCEUR Clear 06/06/2020 1339   LABSPEC 1.011 04/11/2021 1215   PHURINE 5.0 04/11/2021 1215   GLUCOSEU NEGATIVE 04/11/2021 1215   HGBUR NEGATIVE 04/11/2021 1215   BILIRUBINUR NEGATIVE 04/11/2021 1215   BILIRUBINUR Negative 06/06/2020 1339   KETONESUR NEGATIVE 04/11/2021 1215   PROTEINUR NEGATIVE 04/11/2021 1215   NITRITE NEGATIVE 04/11/2021 1215   LEUKOCYTESUR MODERATE (A) 04/11/2021 1215   Sepsis Labs: @LABRCNTIP (procalcitonin:4,lacticidven:4)  ) Recent Results (from the past 240 hour(s))  Body fluid culture w Gram Stain     Status: None (Preliminary result)   Collection Time: 08/16/21 11:46 AM   Specimen: PATH Cytology Pleural fluid  Result Value Ref Range Status   Specimen Description   Final    PLEURAL Performed at [facility], [address], [place], [place] [address]    Special Requests   Final    NONE Performed at [facility], [address] [place], [place] [address]    Gram Stain   Final    WBC PRESENT, PREDOMINANTLY MONONUCLEAR NO ORGANISMS  SEEN CYTOSPIN SMEAR    Culture   Final    NO GROWTH < 24 HOURS Performed at [facility], [place] [address], [place], [place] [address]    Report Status PENDING  Incomplete          Radiology Studies: IR Fluoro Guide CV Line Right  Result Date: 08/16/2021 INDICATION: Need for central venous access EXAM: Ultrasound-guided puncture of the right external jugular vein Placement of a temporary central venous catheter using fluoroscopic guidance MEDICATIONS: None ANESTHESIA/SEDATION: Local analgesia FLUOROSCOPY TIME:  Fluoroscopy Time: 18 seconds with 2 exposures COMPLICATIONS: None immediate. PROCEDURE: Informed written consent was obtained from the patient after a thorough discussion of the procedural risks, benefits and alternatives. All questions were addressed. Maximal Sterile Barrier Technique was utilized including caps, mask, sterile gowns, sterile gloves, sterile drape, hand hygiene and skin antiseptic. A timeout was performed prior to the initiation of the procedure. The patient was placed supine on the exam table. Prior to prepping the neck, limited ultrasound evaluation of the bilateral neck demonstrated no identifiable internal jugular veins. A right external jugular vein was identified and determined to be the best target for access. The right neck and chest was prepped and draped in the standard sterile fashion. Ultrasound was used to evaluate the right external jugular vein, which found to be patent and suitable for access. An ultrasound image was permanently stored in the electronic medical record. Using ultrasound guidance, the right external jugular vein was directly punctured using a 21 gauge micropuncture set. An 018 wire was advanced into the SVC, followed by serial tract dilation and placement of a 7 French triple-lumen central venous catheter which was advanced into the central veins, such that the tip was positioned  in the right atrium. The line was found to flush and  aspirate appropriately. It was sutured to the skin using 0 silk suture. A sterile dressing was placed. The patient tolerated the procedure well without immediate complication. IMPRESSION: 1. Successful placement of a 7 French triple-lumen temporary central venous catheter using the right external jugular vein. The line is ready for immediate use. 2. Of note, ultrasound evaluation of the neck demonstrates no identifiable internal jugular veins. Electronically Signed   By: Albin Felling M.D.   On: 08/16/2021 14:07   DG Chest Port 1 View  Result Date: 08/16/2021 CLINICAL DATA:  Post thoracentesis EXAM: PORTABLE CHEST 1 VIEW COMPARISON:  Portable exam 1340 hours compared to 08/14/2021 FINDINGS: RIGHT jugular line with tip projecting over RIGHT atrium. Enlargement of cardiac silhouette with pulmonary vascular congestion. Decreased LEFT pleural effusion and basilar atelectasis post thoracentesis. No pneumothorax. Small persistent RIGHT pleural effusion and mild RIGHT basilar atelectasis. Osseous structures unremarkable. IMPRESSION: No pneumothorax following LEFT thoracentesis. Enlargement of cardiac silhouette with small bibasilar pleural effusions and atelectasis. Electronically Signed   By: Lavonia Dana M.D.   On: 08/16/2021 13:55   IR THORACENTESIS ASP PLEURAL SPACE W/IMG GUIDE  Result Date: 08/16/2021 INDICATION: Left pleural effusion EXAM: ULTRASOUND GUIDED LEFT THORACENTESIS MEDICATIONS: None. COMPLICATIONS: None immediate. PROCEDURE: An ultrasound guided thoracentesis was thoroughly discussed with the patient and questions answered. The benefits, risks, alternatives and complications were also discussed. The patient understands and wishes to proceed with the procedure. Written consent was obtained. Ultrasound was performed to localize and mark an adequate pocket of fluid in the left chest. The area was then prepped and draped in the normal sterile fashion. 1% Lidocaine was used for local anesthesia. Under  ultrasound guidance a 19 gauge Yueh catheter was introduced. Thoracentesis was performed. The catheter was removed and a dressing applied. FINDINGS: A total of approximately 700 mL of clear yellow fluid was removed. Samples were sent to the laboratory as requested by the clinical team. IMPRESSION: Successful ultrasound guided left thoracentesis yielding 700 mL of clear pleural fluid. Electronically Signed   By: Albin Felling M.D.   On: 08/16/2021 14:00        Scheduled Meds:  apixaban  5 mg Oral BID   Chlorhexidine Gluconate Cloth  6 each Topical Daily   docusate sodium  100 mg Oral Daily   folic acid  1 mg Oral Daily   hydrocortisone   Rectal TID   insulin aspart  0-5 Units Subcutaneous QHS   insulin aspart  0-9 Units Subcutaneous TID WC   iron polysaccharides  150 mg Oral Daily   latanoprost  1 drop Both Eyes QHS   metoprolol tartrate  12.5 mg Oral Q6H   midodrine  10 mg Oral TID WC   pantoprazole (PROTONIX) IV  40 mg Intravenous Q12H   polyethylene glycol  17 g Oral Daily   Ensure Max Protein  11 oz Oral BID   spironolactone  25 mg Oral Daily   vitamin B-12  500 mcg Oral Daily   Continuous Infusions:  sodium chloride Stopped (08/16/21 1559)   furosemide (LASIX) 200 mg in dextrose 5% 100 mL (2mg /mL) infusion 8 mg/hr (08/17/21 0923)     LOS: 22 days   The patient is critically ill with multiple organ systems failure and requires high complexity decision making for assessment and support, frequent evaluation and titration of therapies, application of advanced monitoring technologies and extensive interpretation of multiple databases. Critical Care Time devoted to patient care  services described in this note  Time spent: 40 minutes     Eliyana Pagliaro, Geraldo Docker, MD [facility]   If 7PM-7AM, please contact night-coverage 08/17/2021, 2:06 PM

## 2021-08-17 NOTE — Progress Notes (Signed)
Progress Note  Patient Name: Karen Farley Date of Encounter: 08/17/2021  [facility] Cardiologist: Kate Sable, MD   Subjective   Feels well today, no complaints Sitting in a recliner Legs wrapped No desire to walk around the room, " feet too puffy"  Thoracentesis yesterday 700 out  Echocardiogram reviewed with her showing large left pleural effusion, moderate pericardial effusion  still on Lasix infusion 8 mg/hr -3 L past 24 hours Creatinine 2.06, low potassium  Inpatient Medications    Scheduled Meds:  apixaban  5 mg Oral BID   Chlorhexidine Gluconate Cloth  6 each Topical Daily   docusate sodium  100 mg Oral Daily   folic acid  1 mg Oral Daily   hydrocortisone   Rectal TID   insulin aspart  0-5 Units Subcutaneous QHS   insulin aspart  0-9 Units Subcutaneous TID WC   iron polysaccharides  150 mg Oral Daily   latanoprost  1 drop Both Eyes QHS   metoprolol tartrate  12.5 mg Oral Q6H   midodrine  10 mg Oral TID WC   pantoprazole (PROTONIX) IV  40 mg Intravenous Q12H   polyethylene glycol  17 g Oral Daily   Ensure Max Protein  11 oz Oral BID   spironolactone  25 mg Oral Daily   vitamin B-12  500 mcg Oral Daily   Continuous Infusions:  sodium chloride Stopped (08/16/21 1559)   furosemide (LASIX) 200 mg in dextrose 5% 100 mL (2mg /mL) infusion 8 mg/hr (08/17/21 0923)   PRN Meds: acetaminophen, bisacodyl, lidocaine HCl (PF), lip balm, traMADol   Vital Signs    Vitals:   08/17/21 0341 08/17/21 0806 08/17/21 1142 08/17/21 1516  BP:  96/63 101/71 (!) 109/39  Pulse:  90 93 89  Resp:  17 16 17   Temp:  98 F (36.7 C) 98.1 F (36.7 C) 98.4 F (36.9 C)  TempSrc:      SpO2:  98% 100% 93%  Weight: 98 kg     Height:        Intake/Output Summary (Last 24 hours) at 08/17/2021 1544 Last data filed at 08/17/2021 1400 Gross per 24 hour  Intake 843.6 ml  Output 2050 ml  Net -1206.4 ml   Last 3 Weights 08/17/2021 08/16/2021 08/15/2021  Weight (lbs)  216 lb 1.6 oz 223 lb 12.8 oz 224 lb 14.4 oz  Weight (kg) 98.022 kg 101.515 kg 102.014 kg      Telemetry    Atrial fibrillation personally Reviewed  ECG    - Personally Reviewed  Physical Exam   Constitutional:  oriented to person, place, and time. No distress.  Obese HENT:  Head: Grossly normal Eyes:  no discharge. No scleral icterus.  Neck: Unable to estimate JVD, no carotid bruits  Cardiovascular: Regular rate and rhythm, no murmurs appreciated Pulmonary/Chest: Clear to auscultation bilaterally, dullness at the bases Abdominal: Soft.  no distension.  no tenderness.  Musculoskeletal: Normal range of motion Neurological:  normal muscle tone. Coordination normal. No atrophy Skin: Skin warm and dry Psychiatric: normal affect, pleasant   Labs    High Sensitivity Troponin:   Recent Labs  Lab 07/26/21 1708  TROPONINIHS 17     Chemistry Recent Labs  Lab 08/15/21 0906 08/15/21 1530 08/16/21 0601 08/17/21 1415  NA 135  --  134* 135  K 3.1*  --  3.4* 2.8*  CL 95*  --  95* 90*  CO2 28  --  28 31  GLUCOSE 159*  --  206* 206*  BUN 31*  --  40* 40*  CREATININE 1.72*  --  2.01* 2.06*  CALCIUM 9.2  --  9.1 9.5  MG  --  2.1 2.0 1.8  PROT 6.5  --  5.9* 6.3*  ALBUMIN 4.0  --  3.8 3.8  AST 27  --  28 34  ALT 17  --  18 21  ALKPHOS 73  --  70 77  BILITOT 2.7*  --  2.0* 2.4*  GFRNONAA 31*  --  25* 25*  ANIONGAP 12  --  11 14    Lipids No results for input(s): CHOL, TRIG, HDL, LABVLDL, LDLCALC, CHOLHDL in the last 168 hours.  Hematology Recent Labs  Lab 08/15/21 0906 08/16/21 0601 08/17/21 1415  WBC 6.6 6.7 6.2  RBC 3.70* 3.56* 3.53*  HGB 10.4* 10.0* 10.1*  HCT 33.1* 32.3* 31.1*  MCV 89.5 90.7 88.1  MCH 28.1 28.1 28.6  MCHC 31.4 31.0 32.5  RDW 26.4* 26.5* 26.4*  PLT 287 298 369   Thyroid No results for input(s): TSH, FREET4 in the last 168 hours.  BNP No results for input(s): BNP, PROBNP in the last 168 hours.   DDimer No results for input(s): DDIMER in the  last 168 hours.   Radiology    IR Fluoro Guide CV Line Right  Result Date: 08/16/2021 INDICATION: Need for central venous access EXAM: Ultrasound-guided puncture of the right external jugular vein Placement of a temporary central venous catheter using fluoroscopic guidance MEDICATIONS: None ANESTHESIA/SEDATION: Local analgesia FLUOROSCOPY TIME:  Fluoroscopy Time: 18 seconds with 2 exposures COMPLICATIONS: None immediate. PROCEDURE: Informed written consent was obtained from the patient after a thorough discussion of the procedural risks, benefits and alternatives. All questions were addressed. Maximal Sterile Barrier Technique was utilized including caps, mask, sterile gowns, sterile gloves, sterile drape, hand hygiene and skin antiseptic. A timeout was performed prior to the initiation of the procedure. The patient was placed supine on the exam table. Prior to prepping the neck, limited ultrasound evaluation of the bilateral neck demonstrated no identifiable internal jugular veins. A right external jugular vein was identified and determined to be the best target for access. The right neck and chest was prepped and draped in the standard sterile fashion. Ultrasound was used to evaluate the right external jugular vein, which found to be patent and suitable for access. An ultrasound image was permanently stored in the electronic medical record. Using ultrasound guidance, the right external jugular vein was directly punctured using a 21 gauge micropuncture set. An 018 wire was advanced into the SVC, followed by serial tract dilation and placement of a 7 French triple-lumen central venous catheter which was advanced into the central veins, such that the tip was positioned in the right atrium. The line was found to flush and aspirate appropriately. It was sutured to the skin using 0 silk suture. A sterile dressing was placed. The patient tolerated the procedure well without immediate complication. IMPRESSION: 1.  Successful placement of a 7 French triple-lumen temporary central venous catheter using the right external jugular vein. The line is ready for immediate use. 2. Of note, ultrasound evaluation of the neck demonstrates no identifiable internal jugular veins. Electronically Signed   By: Albin Felling M.D.   On: 08/16/2021 14:07   DG Chest Port 1 View  Result Date: 08/16/2021 CLINICAL DATA:  Post thoracentesis EXAM: PORTABLE CHEST 1 VIEW COMPARISON:  Portable exam 1340 hours compared to 08/14/2021 FINDINGS: RIGHT jugular line with tip projecting over RIGHT atrium. Enlargement of  cardiac silhouette with pulmonary vascular congestion. Decreased LEFT pleural effusion and basilar atelectasis post thoracentesis. No pneumothorax. Small persistent RIGHT pleural effusion and mild RIGHT basilar atelectasis. Osseous structures unremarkable. IMPRESSION: No pneumothorax following LEFT thoracentesis. Enlargement of cardiac silhouette with small bibasilar pleural effusions and atelectasis. Electronically Signed   By: Lavonia Dana M.D.   On: 08/16/2021 13:55   IR THORACENTESIS ASP PLEURAL SPACE W/IMG GUIDE  Result Date: 08/16/2021 INDICATION: Left pleural effusion EXAM: ULTRASOUND GUIDED LEFT THORACENTESIS MEDICATIONS: None. COMPLICATIONS: None immediate. PROCEDURE: An ultrasound guided thoracentesis was thoroughly discussed with the patient and questions answered. The benefits, risks, alternatives and complications were also discussed. The patient understands and wishes to proceed with the procedure. Written consent was obtained. Ultrasound was performed to localize and mark an adequate pocket of fluid in the left chest. The area was then prepped and draped in the normal sterile fashion. 1% Lidocaine was used for local anesthesia. Under ultrasound guidance a 19 gauge Yueh catheter was introduced. Thoracentesis was performed. The catheter was removed and a dressing applied. FINDINGS: A total of approximately 700 mL of clear  yellow fluid was removed. Samples were sent to the laboratory as requested by the clinical team. IMPRESSION: Successful ultrasound guided left thoracentesis yielding 700 mL of clear pleural fluid. Electronically Signed   By: Albin Felling M.D.   On: 08/16/2021 14:00    Cardiac Studies   Echo  1. Left ventricular ejection fraction, by estimation, is 60 to 65%. The  left ventricle has normal function. There is mild left ventricular  hypertrophy.   2. Right ventricular systolic function is normal. The right ventricular  size is normal. There is severely elevated pulmonary artery systolic  pressure.   3. A small pericardial effusion is present. The pericardial effusion is  posterior to the left ventricle and localized near the right atrium.   4. The aortic valve has an indeterminant number of cusps. There is  moderate calcification of the aortic valve. There is moderate thickening  of the aortic valve.   5. The inferior vena cava is dilated in size with <50% respiratory  variability, suggesting right atrial pressure of 15 mmHg.    Patient Profile     75 y.o. female with history of HFpEF, hypertension, CKD 3, permanent atrial fibrillation presenting with hypotension, GI bleed and large pericardial effusion s/p pericardial drainage placement.  Pericardial effusion, hemorrhagic -S/p pericardiocentesis and drain placement with hemorrhagic effusion -Pericardial drainage tube removed last week Initial echo with no reaccumulation of fluid, hemodynamically stable --- Repeat echocardiogram performed 2 days ago with moderate pericardial effusion reaccumulated.  No evidence of tamponade Will need continued hemodynamic monitoring   2.  HFpEF, massive volume overload Presenting with Anasarca arms legs abdomen Has had aggressive Lasix throughout her hospital course, Recently transition to Lasix infusion by nephrology, now with worsening renal dysfunction, creatinine high end of her range -Appears  on echo August 15, 2021 IVC still dilated  3.  Permanent A. Fib Rate well controlled, low blood pressure limiting titration of medication Continue metoprolol tartrate 12.5 every 6, could consolidate   4.  Hypotension/shock In the setting of presumed lower GI bleed, "blow out" 2 week ago Initially placed on pressors, this has since been weaned Stable on midodrine 3 times daily Still with low blood pressure over the past 2 weeks  Metoprolol restarted for rate control stable hemoglobin 10   I discussion with patient, husband at the bedside, discussed long hospital course, projected diuretic course, need for  PT, more ambulation, her overall deconditioning  Total encounter time more than 35 minutes  Greater than 50% was spent in counseling and coordination of care with the patient   For questions or updates, please contact [place] Please consult [email or web address] for contact info under        Signed, Ida Rogue, MD  08/17/2021, 3:44 PM

## 2021-08-17 NOTE — Progress Notes (Signed)
Physical Therapy Treatment Patient Details Name: Karen Farley MRN: [id number] DOB: 1946/02/27 Today's Date: 08/17/2021   History of Present Illness 75 y/o female history HFpEF, Afib on Eliquis, PAD, DM, CKDIII, lymphedema, HTN admitted to hospital 10/6 with sob, orthopnea, weight gain with hypotension, large left pleural effusion, generalized anasarca on admission. S/p left thoracentesis 10/8 652ml removed and paracentesis 10/12 1.2L removed. Failed Lasix IV advanced to Lasix gtt; however, Lasix and Eliquis stopped 10/15 and patient transferred to ICU after had episode of GI bleed with drop in hgb, s/p 2U pRBC and vasopressor support initiated. Echo done 10/16 showed large pericardial effusion s/p pericardiocentesis 772mL fluid removed with drain placed.    PT Comments    Pt feeling better today after having more fluid removed yesterday.  Reviewed seated strengthening exercises since pt declined any mobility stating she changes her position frequently from bed>chair>reclined position. Pt and husband educated on importance of mobility and benefits with good understanding.  Pt given modified "donut" cushion to assist with sitting tolerance due to chronic back pain. Continue per POC, pt will need to increase functional mobility prior to d/c home.   Recommendations for follow up therapy are one component of a multi-disciplinary discharge planning process, led by the attending physician.  Recommendations may be updated based on patient status, additional functional criteria and insurance authorization.  Follow Up Recommendations  Home health PT     Assistance Recommended at Discharge Intermittent Supervision/Assistance  Equipment Recommendations       Recommendations for Other Services       Precautions / Restrictions Precautions Precautions: Fall Restrictions Weight Bearing Restrictions: No     Mobility  Bed Mobility                    Transfers                         Ambulation/Gait                 Stairs             Wheelchair Mobility    Modified Rankin (Stroke Patients Only)       Balance                                            Cognition Arousal/Alertness: Awake/alert Behavior During Therapy: WFL for tasks assessed/performed Overall Cognitive Status: Within Functional Limits for tasks assessed                                          Exercises      General Comments General comments (skin integrity, edema, etc.):  (Reviewed seated exercises with pt and pt's husband consisting of B LE AP, LAQ, Hip ABD, seated march. Also discussed at length chronic back pain and possible ways to relieve. Pt given modified "donut" cushion to relieve compression of spine in sitting)      Pertinent Vitals/Pain Pain Assessment: No/denies pain    Home Living                          Prior Function            PT Goals (current goals can now be found  in the care plan section) Acute Rehab PT Goals Patient Stated Goal: to regain independence    Frequency    Min 2X/week      PT Plan Current plan remains appropriate    Co-evaluation              AM-PAC PT "6 Clicks" Mobility   Outcome Measure  Help needed turning from your back to your side while in a flat bed without using bedrails?: A Little Help needed moving from lying on your back to sitting on the side of a flat bed without using bedrails?: A Little Help needed moving to and from a bed to a chair (including a wheelchair)?: A Little Help needed standing up from a chair using your arms (e.g., wheelchair or bedside chair)?: A Little Help needed to walk in hospital room?: A Little Help needed climbing 3-5 steps with a railing? : A Lot 6 Click Score: 17    End of Session         PT Visit Diagnosis: Muscle weakness (generalized) (M62.81);Difficulty in walking, not elsewhere classified (R26.2);Unsteadiness on  feet (R26.81)     Time: 9030-1499 PT Time Calculation (min) (ACUTE ONLY): 28 min  Charges:  $Therapeutic Exercise: 8-22 mins $Therapeutic Activity: 8-22 mins                    Mikel Cella, PTA   [name] 08/17/2021, 2:21 PM

## 2021-08-17 NOTE — Care Management Important Message (Signed)
Important Message  Patient Details  Name: Karen Farley MRN: [id number] Date of Birth: Nov 29, 1945   Medicare Important Message Given:  Yes     Dannette Barbara 08/17/2021, 12:36 PM

## 2021-08-18 DIAGNOSIS — Z7901 Long term (current) use of anticoagulants: Secondary | ICD-10-CM | POA: Diagnosis not present

## 2021-08-18 DIAGNOSIS — I5033 Acute on chronic diastolic (congestive) heart failure: Secondary | ICD-10-CM | POA: Diagnosis not present

## 2021-08-18 DIAGNOSIS — D62 Acute posthemorrhagic anemia: Secondary | ICD-10-CM | POA: Diagnosis not present

## 2021-08-18 DIAGNOSIS — I4819 Other persistent atrial fibrillation: Secondary | ICD-10-CM | POA: Diagnosis not present

## 2021-08-18 LAB — CBC
HCT: 30.6 % — ABNORMAL LOW (ref 36.0–46.0)
Hemoglobin: 9.7 g/dL — ABNORMAL LOW (ref 12.0–15.0)
MCH: 28.2 pg (ref 26.0–34.0)
MCHC: 31.7 g/dL (ref 30.0–36.0)
MCV: 89 fL (ref 80.0–100.0)
Platelets: 341 10*3/uL (ref 150–400)
RBC: 3.44 MIL/uL — ABNORMAL LOW (ref 3.87–5.11)
RDW: 26.5 % — ABNORMAL HIGH (ref 11.5–15.5)
WBC: 5.6 10*3/uL (ref 4.0–10.5)
nRBC: 0 % (ref 0.0–0.2)

## 2021-08-18 LAB — COMPREHENSIVE METABOLIC PANEL
ALT: 19 U/L (ref 0–44)
AST: 26 U/L (ref 15–41)
Albumin: 3.8 g/dL (ref 3.5–5.0)
Alkaline Phosphatase: 74 U/L (ref 38–126)
Anion gap: 10 (ref 5–15)
BUN: 44 mg/dL — ABNORMAL HIGH (ref 8–23)
CO2: 34 mmol/L — ABNORMAL HIGH (ref 22–32)
Calcium: 9.4 mg/dL (ref 8.9–10.3)
Chloride: 93 mmol/L — ABNORMAL LOW (ref 98–111)
Creatinine, Ser: 1.85 mg/dL — ABNORMAL HIGH (ref 0.44–1.00)
GFR, Estimated: 28 mL/min — ABNORMAL LOW (ref >60)
Glucose, Bld: 161 mg/dL — ABNORMAL HIGH (ref 70–99)
Potassium: 3.1 mmol/L — ABNORMAL LOW (ref 3.5–5.1)
Sodium: 137 mmol/L (ref 135–145)
Total Bilirubin: 2.2 mg/dL — ABNORMAL HIGH (ref 0.3–1.2)
Total Protein: 6.1 g/dL — ABNORMAL LOW (ref 6.5–8.1)

## 2021-08-18 LAB — PHOSPHORUS: Phosphorus: 3.4 mg/dL (ref 2.5–4.6)

## 2021-08-18 LAB — GLUCOSE, CAPILLARY
Glucose-Capillary: 154 mg/dL — ABNORMAL HIGH (ref 70–99)
Glucose-Capillary: 211 mg/dL — ABNORMAL HIGH (ref 70–99)
Glucose-Capillary: 232 mg/dL — ABNORMAL HIGH (ref 70–99)
Glucose-Capillary: 174 mg/dL — ABNORMAL HIGH (ref 70–99)

## 2021-08-18 LAB — MAGNESIUM: Magnesium: 2.7 mg/dL — ABNORMAL HIGH (ref 1.7–2.4)

## 2021-08-18 LAB — VITAMIN B12: Vitamin B-12: 977 pg/mL — ABNORMAL HIGH (ref 180–914)

## 2021-08-18 MED ORDER — POTASSIUM CHLORIDE 10 MEQ/50ML IV SOLN
10.0000 meq | Freq: Once | INTRAVENOUS | Status: AC
  Administered 2021-08-18: 10 meq via INTRAVENOUS
  Filled 2021-08-18: qty 50

## 2021-08-18 NOTE — Progress Notes (Signed)
PROGRESS NOTE    Karen Farley  ZJI:[id number] DOB: [birth year] DOA: 07/26/2021 PCP: Valerie Roys, DO   Brief Narrative:  75 y.o. WF PMHx HFpEF, permanent atrial fibrillation on Eliquis, PAD, type 2 diabetes, CKD stage IIIb, lymphedema   Presented to hospital with increasing shortness of breath and edema.  Patient has been noticing increasing abdominal distention since her admission in August with severe lower extremity edema and dyspnea and exertion.   Subjective: 10/29 A/O x4, sitting in chair comfortably.   Assessment & Plan:  Covid vaccination; vaccinated 3/3  Principal Problem:   Acute blood loss anemia Active Problems:   Type 2 diabetes mellitus with renal complication (HCC)   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 diabetes mellitus (HCC)   Atrial fibrillation (HCC)   Acute heart failure with preserved ejection fraction (HFpEF) (HCC)   Pleural effusion   Acute on chronic diastolic CHF (congestive heart failure) (HCC)   Chronic anticoagulation   Shock circulatory (HCC)   Pericardial effusion  GI bleed with hemorrhagic shock. --Patient was noted to have significant drop in hemoglobin to 9.7 from 12.4 and was transferred to the ICU.   --GI Dr Alice Reichert from 10/19-- May forego endoluminal evaluation for the near future given lack of bleeding recurrence and persistence of cardiopulmonary issues. Patient is in agreement. --10/23-- d/w Dr Russo--no Rectal bleed. Ok to resume po eliquis   Large pericardial effusion  -S/p percardiocentesis  Large Pleural effusion s/p thoracentesis Patient underwent a pericardiocentesis on 08/05/2021 with removal of 700 mL of pleural fluid.  Inflammatory markers were unremarkable.  Cardiology monitoring the patient while in the hospital.  Still on vasopressors due to hypotension. --repeat echo 10/21--no pericaridal effusion  Acute on chronic diastolic CHF -Strict in and out -13.1 kg (10/29) - Daily weight Filed Weights   08/16/21 0516 08/17/21 0341  08/18/21 0522  Weight: 101.5 kg 98 kg 91.2 kg    Severe Anasarca -status post thoracocentesis with removal of 600 mL of fluid on 07/28/2021. --10/20-- torsemide 20 mg qd with IV albumin --10/21-- IV lqasix 40 mg bid per cardiology --10/22 cont IV lasix--good uop --10/23--IV lasix --10/24-- Dr Juleen China wants to start IV lasxi gtt --10/25-cont Lasix gtt     Hypotension suspect  -Secondary to hemorrhagic shock and low intravsacular volume --Antihypertensives on hold.   --cont po  midodrine.   --IV Albumin       Acute kidney injury on CKD stage 3b(Baseline Cr 1.4-1.7) Lab Results  Component Value Date   CREATININE 1.85 (H) 08/18/2021   CREATININE 2.06 (H) 08/17/2021   CREATININE 2.01 (H) 08/16/2021   CREATININE 1.72 (H) 08/15/2021   CREATININE 1.79 (H) 08/15/2021  -Currently at baseline.   --Nephrology followed the patient during hospitalization.  -- Benazepril and spironolactone on hold.   Permanent atrial fibrillation --Was previously on Eliquis.  Currently on hold due to GI bleed.  -- Rate controlled at this time--not on any po rate controlling meds --10/21--started on IV heparin gtt--monitor hgb  --10/22-- hgb remains stable --10/23-- discussed with G.I. Dr. Virgina Jock and cardiology Dr. Rockey Situ plan is to move forward with starting eliquis while patient is in-house to make sure she tolerates. DC heparin drip. Patient is in agreement with plan. -10/24--Hgb stable   Type 2 diabetes mellitus Diet controlled at home.   --Continue sliding scale insulin while in the hospital.   Iron deficiency/Vitamin B12  --patient received Venofer -cont oral iron and vitamin B12 supplements.  Hypokalemia -Potassium goal> 4 - 10/28 Potassium  80 mEq  Hypomagnesmia - Magnesium goal> 2 - 10/28 Magnesium IV 3 g   Morbidly Obese  Body mass index is 36.78 kg/m.     DVT prophylaxis: Eliquis (held for thoracentesis) Code Status: Full Family Communication: 10/29 husband at bedside for  discussion plan of care all questions answered Status is: Inpatient    Dispo: The patient is from: Home              Anticipated d/c is to: Home              Anticipated d/c date is: > 3 days              Patient currently is not medically stable to d/c.      Consultants:  Nephrology IR    Procedures/Significant Events:  10/8 s/p left thoracentesis: 659ml aspirated 10/12 paracentesis 1.2L aspirated .  10/16 Echocardiogram performed: Pericardiocentesis 785ml aspirated and drain placed  Failed Lasix IV advanced to Lasix gtt; however, Lasix and Eliquis stopped 10/15 and patient transferred to ICU after had episode of GI bleed with drop in hgb, s/p 2U pRBC and vasopressor support initiated.    10/21 Echocardiogram Left Ventricle: Left ventricular ejection fraction, by estimation, is 60  to 65%. The left ventricle has normal function. There is mild left  ventricular hypertrophy.   Right Ventricle: The right ventricular size is normal. No increase in  right ventricular wall thickness. Right ventricular systolic function is  normal. There is severely elevated pulmonary artery systolic pressure. The  tricuspid regurgitant velocity is  3.42 m/s, and with an assumed right atrial pressure of 15 mmHg, the  estimated right ventricular systolic pressure is 98.3 mmHg.   Pericardium: A small pericardial effusion is present. The pericardial  effusion is posterior to the left ventricle and localized near the right  atrium. Presence of pericardial fat pad.   Mitral Valve: There is mild thickening of the mitral valve leaflet(s).  Mild mitral annular calcification. MV peak gradient, 15.1 mmHg. The mean  mitral valve gradient is 7.0 mmHg.   Tricuspid Valve: The tricuspid valve is not well visualized.   Aortic Valve: The aortic valve has an indeterminant number of cusps. There  is moderate calcification of the aortic valve. There is moderate  thickening of the aortic valve.   Venous:  The inferior vena cava is dilated in size with less than 50%  respiratory variability, suggesting right atrial pressure of 15 mmHg.  10/27 LEFT thoracentesis: Aspirated 700 mL of clear yellow fluid was removed      I have personally reviewed and interpreted all radiology studies and my findings are as above.  VENTILATOR SETTINGS:    Cultures   Antimicrobials: Anti-infectives (From admission, onward)    None         Devices    LINES / TUBES:      Continuous Infusions:  sodium chloride Stopped (08/18/21 0325)   furosemide (LASIX) 200 mg in dextrose 5% 100 mL (2mg /mL) infusion 8 mg/hr (08/17/21 1700)     Objective: Vitals:   08/18/21 0450 08/18/21 0522 08/18/21 0759 08/18/21 1132  BP: 93/82  121/66 (!) 101/50  Pulse: 91  78 82  Resp: 20  17 17   Temp: 98.1 F (36.7 C)  98.5 F (36.9 C) 98.4 F (36.9 C)  TempSrc:   Oral Oral  SpO2: 99%  100% 99%  Weight:  91.2 kg    Height:        Intake/Output Summary (Last 24 hours) at  08/18/2021 1404 Last data filed at 08/18/2021 0954 Gross per 24 hour  Intake 462.7 ml  Output 2250 ml  Net -1787.3 ml    Filed Weights   08/16/21 0516 08/17/21 0341 08/18/21 0522  Weight: 101.5 kg 98 kg 91.2 kg    Examination:  General: A/O x4 No acute respiratory distress Eyes: negative scleral hemorrhage, negative anisocoria, negative icterus ENT: Negative Runny nose, negative gingival bleeding, Neck:  Negative scars, masses, torticollis, lymphadenopathy, JVD Lungs: Clear to auscultation bilaterally without wheezes or crackles Cardiovascular: Regular rate and rhythm without murmur gallop or rub normal S1 and S2 Abdomen: Positive MORBID OBESITY, positive ANASARCA abdominal pain, nondistended, positive soft, bowel sounds, no rebound, no ascites, no appreciable mass Extremities: POSITIVE bilateral lower extremity edema (ANASARCA) Skin: Negative rashes, lesions, ulcers Psychiatric:  Negative depression, negative anxiety,  negative fatigue, negative mania  Central nervous system:  Cranial nerves II through XII intact, tongue/uvula midline, all extremities muscle strength 5/5, sensation intact throughout, negative dysarthria, negative expressive aphasia, negative receptive aphasia.  .     Data Reviewed: Care during the described time interval was provided by me .  I have reviewed this patient's available data, including medical history, events of note, physical examination, and all test results as part of my evaluation.   CBC: Recent Labs  Lab 08/14/21 0423 08/15/21 0906 08/16/21 0601 08/17/21 1415 08/18/21 0637  WBC 8.6 6.6 6.7 6.2 5.6  HGB 9.8* 10.4* 10.0* 10.1* 9.7*  HCT 32.4* 33.1* 32.3* 31.1* 30.6*  MCV 91.5 89.5 90.7 88.1 89.0  PLT 272 287 298 369 026    Basic Metabolic Panel: Recent Labs  Lab 08/14/21 0423 08/15/21 0429 08/15/21 0906 08/15/21 1530 08/16/21 0601 08/17/21 1415 08/18/21 0637  NA 139 135 135  --  134* 135 137  K 4.0 3.3* 3.1*  --  3.4* 2.8* 3.1*  CL 99 96* 95*  --  95* 90* 93*  CO2 27 30 28   --  28 31 34*  GLUCOSE 145* 145* 159*  --  206* 206* 161*  BUN 35* 33* 31*  --  40* 40* 44*  CREATININE 1.55* 1.79* 1.72*  --  2.01* 2.06* 1.85*  CALCIUM 9.3 9.0 9.2  --  9.1 9.5 9.4  MG 2.2 2.2  --  2.1 2.0 1.8 2.7*  PHOS 2.6 2.7  --   --  3.3 2.9 3.4    GFR: Estimated Creatinine Clearance: 27.6 mL/min (A) (by C-G formula based on SCr of 1.85 mg/dL (H)). Liver Function Tests: Recent Labs  Lab 08/13/21 0417 08/15/21 0906 08/16/21 0601 08/17/21 1415 08/18/21 0637  AST  --  27 28 34 26  ALT  --  17 18 21 19   ALKPHOS  --  73 70 77 74  BILITOT  --  2.7* 2.0* 2.4* 2.2*  PROT  --  6.5 5.9* 6.3* 6.1*  ALBUMIN 3.9 4.0 3.8 3.8 3.8    No results for input(s): LIPASE, AMYLASE in the last 168 hours. No results for input(s): AMMONIA in the last 168 hours. Coagulation Profile: No results for input(s): INR, PROTIME in the last 168 hours.  Cardiac Enzymes: No results for  input(s): CKTOTAL, CKMB, CKMBINDEX, TROPONINI in the last 168 hours. BNP (last 3 results) No results for input(s): PROBNP in the last 8760 hours. HbA1C: No results for input(s): HGBA1C in the last 72 hours. CBG: Recent Labs  Lab 08/17/21 1145 08/17/21 1605 08/17/21 2139 08/18/21 0759 08/18/21 1133  GLUCAP 212* 158* 188* 154* 211*  Lipid Profile: No results for input(s): CHOL, HDL, LDLCALC, TRIG, CHOLHDL, LDLDIRECT in the last 72 hours. Thyroid Function Tests: No results for input(s): TSH, T4TOTAL, FREET4, T3FREE, THYROIDAB in the last 72 hours. Anemia Panel: Recent Labs    08/17/21 2110  VITAMINB12 977*  FOLATE 28.0  FERRITIN 149  TIBC 346  IRON 35  RETICCTPCT 5.3*   Urine analysis:    Component Value Date/Time   COLORURINE YELLOW (A) 04/11/2021 1215   APPEARANCEUR HAZY (A) 04/11/2021 1215   APPEARANCEUR Clear 06/06/2020 1339   LABSPEC 1.011 04/11/2021 1215   PHURINE 5.0 04/11/2021 1215   GLUCOSEU NEGATIVE 04/11/2021 1215   HGBUR NEGATIVE 04/11/2021 [place] 04/11/2021 1215   BILIRUBINUR Negative 06/06/2020 1339   KETONESUR NEGATIVE 04/11/2021 1215   PROTEINUR NEGATIVE 04/11/2021 1215   NITRITE NEGATIVE 04/11/2021 1215   LEUKOCYTESUR MODERATE (A) 04/11/2021 1215   Sepsis Labs: @LABRCNTIP (procalcitonin:4,lacticidven:4)  ) Recent Results (from the past 240 hour(s))  Acid Fast Smear (AFB)     Status: None   Collection Time: 08/16/21 11:46 AM   Specimen: PATH Cytology Pleural fluid  Result Value Ref Range Status   AFB Specimen Processing Concentration  Final   Acid Fast Smear Negative  Final    Comment: (NOTE) Performed At: [facility] [address] [place], [place] [id number] Rush Farmer MD [id number]    Source (AFB) PLEURAL  Final    Comment: Performed at [facility], [address] [place], [place] [address]  Body fluid culture w Gram Stain     Status: None (Preliminary result)   Collection Time:  08/16/21 11:46 AM   Specimen: PATH Cytology Pleural fluid  Result Value Ref Range Status   Specimen Description   Final    PLEURAL Performed at [facility], [address] [place], [place] [address]    Special Requests   Final    NONE Performed at [facility], [address] [place], [place] [address]    Gram Stain   Final    WBC PRESENT, PREDOMINANTLY MONONUCLEAR NO ORGANISMS SEEN CYTOSPIN SMEAR    Culture   Final    NO GROWTH 2 DAYS Performed at [facility], [place] [address] [place],  [address]    Report Status PENDING  Incomplete          Radiology Studies: No results found.      Scheduled Meds:  apixaban  5 mg Oral BID   Chlorhexidine Gluconate Cloth  6 each Topical Daily   docusate sodium  100 mg Oral Daily   folic acid  1 mg Oral Daily   hydrocortisone   Rectal TID   insulin aspart  0-5 Units Subcutaneous QHS   insulin aspart  0-9 Units Subcutaneous TID WC   iron polysaccharides  150 mg Oral Daily   latanoprost  1 drop Both Eyes QHS   metoprolol tartrate  12.5 mg Oral Q6H   midodrine  10 mg Oral TID WC   pantoprazole (PROTONIX) IV  40 mg Intravenous Q12H   polyethylene glycol  17 g Oral Daily   Ensure Max Protein  11 oz Oral BID   spironolactone  25 mg Oral Daily   vitamin B-12  500 mcg Oral Daily   Continuous Infusions:  sodium chloride Stopped (08/18/21 0325)   furosemide (LASIX) 200 mg in dextrose 5% 100 mL (2mg /mL) infusion 8 mg/hr (08/17/21 1700)     LOS: 23 days   The patient is critically ill  with multiple organ systems failure and requires high complexity decision making for assessment and support, frequent evaluation and titration of therapies, application of advanced monitoring technologies and extensive interpretation of multiple databases. Critical Care Time devoted to patient care services described in this note  Time spent: 40 minutes     Jalexia Lalli, Geraldo Docker, MD [facility]   If 7PM-7AM,  please contact night-coverage 08/18/2021, 2:04 PM

## 2021-08-18 NOTE — Progress Notes (Signed)
Progress Note  Patient Name: GRIFFIN DEWILDE Date of Encounter: 08/18/2021  Primary Cardiologist: Agbor-Etang  Subjective   Feels well this morning. No chest pain, dyspnea, dizziness, presyncope, or syncope. Documented UOP 2.3 L for the past 24 hours, net - 13.1 L for the admission. Weight 98-->91.2 kg. Remains on Lasix gtt. Renal function improving this morning. HGB low, though stable.   Inpatient Medications    Scheduled Meds:  apixaban  5 mg Oral BID   Chlorhexidine Gluconate Cloth  6 each Topical Daily   docusate sodium  100 mg Oral Daily   folic acid  1 mg Oral Daily   hydrocortisone   Rectal TID   insulin aspart  0-5 Units Subcutaneous QHS   insulin aspart  0-9 Units Subcutaneous TID WC   iron polysaccharides  150 mg Oral Daily   latanoprost  1 drop Both Eyes QHS   metoprolol tartrate  12.5 mg Oral Q6H   midodrine  10 mg Oral TID WC   pantoprazole (PROTONIX) IV  40 mg Intravenous Q12H   polyethylene glycol  17 g Oral Daily   Ensure Max Protein  11 oz Oral BID   spironolactone  25 mg Oral Daily   vitamin B-12  500 mcg Oral Daily   Continuous Infusions:  sodium chloride Stopped (08/18/21 0325)   furosemide (LASIX) 200 mg in dextrose 5% 100 mL (2mg /mL) infusion 8 mg/hr (08/17/21 1700)   PRN Meds: acetaminophen, bisacodyl, lidocaine HCl (PF), lip balm, traMADol   Vital Signs    Vitals:   08/18/21 0316 08/18/21 0450 08/18/21 0522 08/18/21 0759  BP: (!) 93/51 93/82  121/66  Pulse:  91  78  Resp:  20  17  Temp: 97.6 F (36.4 C) 98.1 F (36.7 C)  98.5 F (36.9 C)  TempSrc:    Oral  SpO2:  99%  100%  Weight:   91.2 kg   Height:        Intake/Output Summary (Last 24 hours) at 08/18/2021 0923 Last data filed at 08/18/2021 0522 Gross per 24 hour  Intake 702.7 ml  Output 2600 ml  Net -1897.3 ml   Filed Weights   08/16/21 0516 08/17/21 0341 08/18/21 0522  Weight: 101.5 kg 98 kg 91.2 kg    Telemetry    Afib, 80s to 90s bpm - Personally  Reviewed  ECG    No new tracings - Personally Reviewed  Physical Exam   GEN: No acute distress.   Neck: JVD difficult to assess secondary to body habitus. Cardiac: IRIR, I/VI systolic murmur, no rubs, or gallops.  Respiratory: Diminished breath sounds along the bilateral bases.  GI: Soft, nontender, non-distended.   MS: Lower extremities wrapped with 1-2+ bilateral pitting edema; No deformity. Neuro:  Alert and oriented x 3; Nonfocal.  Psych: Normal affect.  Labs    Chemistry Recent Labs  Lab 08/16/21 0601 08/17/21 1415 08/18/21 0637  NA 134* 135 137  K 3.4* 2.8* 3.1*  CL 95* 90* 93*  CO2 28 31 34*  GLUCOSE 206* 206* 161*  BUN 40* 40* 44*  CREATININE 2.01* 2.06* 1.85*  CALCIUM 9.1 9.5 9.4  PROT 5.9* 6.3* 6.1*  ALBUMIN 3.8 3.8 3.8  AST 28 34 26  ALT 18 21 19   ALKPHOS 70 77 74  BILITOT 2.0* 2.4* 2.2*  GFRNONAA 25* 25* 28*  ANIONGAP 11 14 10      Hematology Recent Labs  Lab 08/16/21 0601 08/17/21 1415 08/17/21 2110 08/18/21 0637  WBC 6.7 6.2  --  5.6  RBC 3.56* 3.53* 3.52* 3.44*  HGB 10.0* 10.1*  --  9.7*  HCT 32.3* 31.1*  --  30.6*  MCV 90.7 88.1  --  89.0  MCH 28.1 28.6  --  28.2  MCHC 31.0 32.5  --  31.7  RDW 26.5* 26.4*  --  26.5*  PLT 298 369  --  341    Cardiac EnzymesNo results for input(s): TROPONINI in the last 168 hours. No results for input(s): TROPIPOC in the last 168 hours.   BNPNo results for input(s): BNP, PROBNP in the last 168 hours.   DDimer No results for input(s): DDIMER in the last 168 hours.   Radiology    IR Fluoro Guide CV Line Right  Result Date: 08/16/2021 IMPRESSION: 1. Successful placement of a 7 French triple-lumen temporary central venous catheter using the right external jugular vein. The line is ready for immediate use. 2. Of note, ultrasound evaluation of the neck demonstrates no identifiable internal jugular veins. Electronically Signed   By: Albin Felling M.D.   On: 08/16/2021 14:07   DG Chest Port 1  View  Result Date: 08/16/2021 IMPRESSION: No pneumothorax following LEFT thoracentesis. Enlargement of cardiac silhouette with small bibasilar pleural effusions and atelectasis. Electronically Signed   By: Lavonia Dana M.D.   On: 08/16/2021 13:55   IR THORACENTESIS ASP PLEURAL SPACE W/IMG GUIDE  Result Date: 08/16/2021 IMPRESSION: Successful ultrasound guided left thoracentesis yielding 700 mL of clear pleural fluid. Electronically Signed   By: Albin Felling M.D.   On: 08/16/2021 14:00    Cardiac Studies   2D echo 08/05/2021:   1. Left ventricular ejection fraction, by estimation, is 60 to 65%. The  left ventricle has normal function. The left ventricle has no regional  wall motion abnormalities. There is mild left ventricular hypertrophy.  Left ventricular diastolic parameters  are indeterminate.   2. Right ventricular systolic function is normal. The right ventricular  size is normal. There is moderately elevated pulmonary artery systolic  pressure. The estimated right ventricular systolic pressure is 01.7 mmHg.   3. Left atrial size was mildly dilated.   4. The mitral valve is normal in structure. Mild mitral valve  regurgitation.   5. The aortic valve was not well visualized,Calcification noted. Aortic  valve regurgitation is mild. Mild to moderate aortic valve  sclerosis/calcification is present, unable to exclude mild aortic valve  regurgitation.   6. The inferior vena cava is dilated in size with <50% respiratory  variability, suggesting right atrial pressure of 15 mmHg.   7. A large pericardial effusion is present. 2.74 to 3.39 cm  circumferential, no significant RA or RV collapse. Mitral and tricuspid  valve in-flow velocities not measured. Grossly, there does not appear to  be tamponade. Of concern is the dilated IVC with   little respiratory variation and moderately elevated right heart  pressures.  __________     Pericardiocentesis 08/05/2021:  Successful  pericardiocentesis with placement of 6 French pigtail catheter into pericardial space with removal of 700 cc of bloody pericardial fluid  __________     Limited echo 08/06/2021:  1. Left ventricular ejection fraction, by estimation, is >55%. The left  ventricle has normal function. Left ventricular endocardial border not  optimally defined to evaluate regional wall motion.   2. Right ventricular systolic function is normal. The right ventricular  size is normal. There is mildly elevated pulmonary artery systolic  pressure.   3. The pericardial effusion has decreased in size since yesterday  but  still appears large. The largest collections lie posterolateral to the  left ventricle.. There is no evidence of cardiac tamponade.   4. The inferior vena cava is dilated in size with >50% respiratory  variability, suggesting right atrial pressure of 8 mmHg.  __________     2D echo 08/07/2021:  1. Left ventricular ejection fraction, by estimation, is >55%. The left  ventricle has normal function.   2. Right ventricular systolic function is normal. The right ventricular  size is normal. There is moderately elevated pulmonary artery systolic  pressure.   3. There is a predominantly posterior small-moderate pericardial  effusion. Echogenic material in the pericardial space may reflect  epicardial fat, thrombus, or other soft tissue. There is no evidence of  cardiac tamponade. Large pleural effusion.   4. The inferior vena cava is normal in size with <50% respiratory  variability, suggesting right atrial pressure of 8 mmHg.  __________     Limited echo 08/08/2021:  1. Left ventricular ejection fraction, by estimation, is 60 to 65%. The  left ventricle has normal function.   2. Right ventricular systolic function is normal. There is severely  elevated pulmonary artery systolic pressure. The estimated right  ventricular systolic pressure is 76.2 mmHg.   3. Large pleural effusion in the left  lateral region.   4. The inferior vena cava is dilated in size with <50% respiratory  variability, suggesting right atrial pressure of 15 mmHg.  __________     Limited echo 08/10/2021:   1. Left ventricular ejection fraction, by estimation, is 60 to 65%. The  left ventricle has normal function. There is mild left ventricular  hypertrophy.   2. Right ventricular systolic function is normal. The right ventricular  size is normal. There is severely elevated pulmonary artery systolic  pressure.   3. A small pericardial effusion is present. The pericardial effusion is  posterior to the left ventricle and localized near the right atrium.   4. The aortic valve has an indeterminant number of cusps. There is  moderate calcification of the aortic valve. There is moderate thickening  of the aortic valve.   5. The inferior vena cava is dilated in size with <50% respiratory  variability, suggesting right atrial pressure of 15 mmHg.  __________     Limited echo 08/15/2021:  1. Left ventricular ejection fraction, by estimation, is 55 to 60%. The  left ventricle has normal function.   2. Moderate pericardial effusion. The pericardial effusion is posterior  and lateral to the left ventricle and posterior to the left ventricle and  the left atrium. There is no evidence of cardiac tamponade. Large pleural  effusion in the left lateral  region.   3. The inferior vena cava is dilated in size with <50% respiratory  variability, suggesting right atrial pressure of 15   Patient Profile     75 y.o. female with history of HFpEF, permanent Afib, GI bleed, CKD stage III, lymphedema, HTN, HLD, and obesity who we are seeing for hemorrhagic pericardial effusion s/p pericardiocentesis 08/05/2021.  Assessment & Plan    1. Hemorrhagic pericardial effusion:  - Status post pericardiocentesis on 08/05/2021 with drain subsequently removed -Serial echoes as above, with most recent study showing reaccumulation  of  moderate pericardial effusion without evidence of tamponade  -Hemodynamically stable -Repeat limited echo to evaluate pericardial effusion     2. HFpEF with anasarca and left pleural effusion:  -Status post thoracentesis on 10/8 and again on 10/27  -Renal function improving  this morning -She remains on IV Lasix gtt with the assistance of nephrology -Once she has been adequately diuresis, or if her renal function declines again, we may need to consider RHC to assess her hemodynamics  -May need to consider stopping spironolactone with underlying renal dysfunction  -Repeat limited echo to evaluate IVC    3. Permament Afib:  -Ventricular rates well controlled  -Metoprolol  -Eliquis    4. Hypovolemic shock:  -Presumed to be hypovolemic with GI bleed initially requiring vasopressor support  -On midodrine     5. GI bleed with symptomatic anemia: -Low, though stable -Monitor on Eliquis -Per IM  6. Acute on CKD stage IIIb: -Improving this morning -Nephrology following  7. Hypokalemia: -On spironolactone   For questions or updates, please contact [place] Please consult [email or web address] for contact info under Cardiology/STEMI.    Signed, Christell Faith, PA-C [place] Pager: [phone] 08/18/2021, 9:23 AM

## 2021-08-18 NOTE — Progress Notes (Signed)
[facility]  ROUNDING NOTE   Subjective:   Karen Farley was admitted to [facility] on 07/26/2021 for Morbid obesity ([place]) [E66.01] DOE (dyspnea on exertion) [R06.09] Recurrent left pleural effusion [J90] Hypotension [I95.9] Type 2 diabetes mellitus without complication, without long-term current use of insulin ([place]) [E11.9] Atrial fibrillation, unspecified type ([place]) [I48.91] Chronic congestive heart failure, unspecified heart failure type ([place]) [I50.9]  Patient sitting on a recliner near the bed,after receiving assistance with personal care.She reports she is feeling better. She is diuresing well on Lasix infusion 8 mg/hr.  Urine Output 3,250 for the preceding 24 hours  Objective:  Vital signs in last 24 hours:  Temp:  [97.6 F (36.4 C)-98.5 F (36.9 C)] 98.5 F (36.9 C) (10/29 0759) Pulse Rate:  [78-93] 78 (10/29 0759) Resp:  [16-20] 17 (10/29 0759) BP: (93-121)/(39-82) 121/66 (10/29 0759) SpO2:  [93 %-100 %] 100 % (10/29 0759) Weight:  [91.2 kg] 91.2 kg (10/29 0522)  Weight change: -6.804 kg Filed Weights   08/16/21 0516 08/17/21 0341 08/18/21 0522  Weight: 101.5 kg 98 kg 91.2 kg    Intake/Output: I/O last 3 completed shifts: In: 942.7 [P.O.:840; I.V.:102.7] Out: 3250 [Urine:3250]   Intake/Output this shift:  Total I/O In: -  Out: 650 [Urine:650]  Physical Exam: General: In no acute distress, appears pleasant  Head: oral mucosal membranes moist  Lungs:  Respirations slightly labored with exertion, fine crackles at the bases  Heart: S1S2, irregular  Abdomen:  Non tender  Extremities:  ++ peripheral edema. Anasarca +  Neurologic: Awake, alert, able to answer questions appropriately  Skin: No acute lesions or rashes        Basic Metabolic Panel: Recent Labs  Lab 08/14/21 0423 08/15/21 0429 08/15/21 0906 08/15/21 1530 08/16/21 0601 08/17/21 1415 08/18/21 0637  NA 139 135 135  --  134* 135 137  K 4.0 3.3* 3.1*  --  3.4* 2.8* 3.1*  CL  99 96* 95*  --  95* 90* 93*  CO2 27 30 28   --  28 31 34*  GLUCOSE 145* 145* 159*  --  206* 206* 161*  BUN 35* 33* 31*  --  40* 40* 44*  CREATININE 1.55* 1.79* 1.72*  --  2.01* 2.06* 1.85*  CALCIUM 9.3 9.0 9.2  --  9.1 9.5 9.4  MG 2.2 2.2  --  2.1 2.0 1.8 2.7*  PHOS 2.6 2.7  --   --  3.3 2.9 3.4     Liver Function Tests: Recent Labs  Lab 08/13/21 0417 08/15/21 0906 08/16/21 0601 08/17/21 1415 08/18/21 0637  AST  --  27 28 34 26  ALT  --  17 18 21 19   ALKPHOS  --  73 70 77 74  BILITOT  --  2.7* 2.0* 2.4* 2.2*  PROT  --  6.5 5.9* 6.3* 6.1*  ALBUMIN 3.9 4.0 3.8 3.8 3.8    No results for input(s): LIPASE, AMYLASE in the last 168 hours. No results for input(s): AMMONIA in the last 168 hours.  CBC: Recent Labs  Lab 08/14/21 0423 08/15/21 0906 08/16/21 0601 08/17/21 1415 08/18/21 0637  WBC 8.6 6.6 6.7 6.2 5.6  HGB 9.8* 10.4* 10.0* 10.1* 9.7*  HCT 32.4* 33.1* 32.3* 31.1* 30.6*  MCV 91.5 89.5 90.7 88.1 89.0  PLT 272 287 298 369 341     Cardiac Enzymes: No results for input(s): CKTOTAL, CKMB, CKMBINDEX, TROPONINI in the last 168 hours.  BNP: Invalid input(s): POCBNP  CBG: Recent Labs  Lab 08/17/21  3382 08/17/21 1145 08/17/21 1605 08/17/21 2139 08/18/21 0759  GLUCAP 135* 212* 158* 188* 154*     Microbiology: Results for orders placed or performed during the hospital encounter of 07/26/21  Resp Panel by RT-PCR (Flu A&B, Covid) Nasopharyngeal Swab     Status: None   Collection Time: 07/26/21  7:19 PM   Specimen: Nasopharyngeal Swab; Nasopharyngeal(NP) swabs in vial transport medium  Result Value Ref Range Status   SARS Coronavirus 2 by RT PCR NEGATIVE NEGATIVE Final    Comment: (NOTE) SARS-CoV-2 target nucleic acids are NOT DETECTED.  The SARS-CoV-2 RNA is generally detectable in upper respiratory specimens during the acute phase of infection. The lowest concentration of SARS-CoV-2 viral copies this assay can detect is 138 copies/mL. A negative result  does not preclude SARS-Cov-2 infection and should not be used as the sole basis for treatment or other patient management decisions. A negative result may occur with  improper specimen collection/handling, submission of specimen other than nasopharyngeal swab, presence of viral mutation(s) within the areas targeted by this assay, and inadequate number of viral copies(<138 copies/mL). A negative result must be combined with clinical observations, patient history, and epidemiological information. The expected result is Negative.  Fact Sheet for Patients:  EntrepreneurPulse.com.au  Fact Sheet for Healthcare Providers:  IncredibleEmployment.be  This test is no t yet approved or cleared by the [place] FDA and  has been authorized for detection and/or diagnosis of SARS-CoV-2 by FDA under an Emergency Use Authorization (EUA). This EUA will remain  in effect (meaning this test can be used) for the duration of the COVID-19 declaration under Section 564(b)(1) of the Act, 21 U.S.C.section 360bbb-3(b)(1), unless the authorization is terminated  or revoked sooner.       Influenza A by PCR NEGATIVE NEGATIVE Final   Influenza B by PCR NEGATIVE NEGATIVE Final    Comment: (NOTE) The Xpert Xpress SARS-CoV-2/FLU/RSV plus assay is intended as an aid in the diagnosis of influenza from Nasopharyngeal swab specimens and should not be used as a sole basis for treatment. Nasal washings and aspirates are unacceptable for Xpert Xpress SARS-CoV-2/FLU/RSV testing.  Fact Sheet for Patients: EntrepreneurPulse.com.au  Fact Sheet for Healthcare Providers: IncredibleEmployment.be  This test is not yet approved or cleared by the [place] FDA and has been authorized for detection and/or diagnosis of SARS-CoV-2 by FDA under an Emergency Use Authorization (EUA). This EUA will remain in effect (meaning this test can be used) for  the duration of the COVID-19 declaration under Section 564(b)(1) of the Act, 21 U.S.C. section 360bbb-3(b)(1), unless the authorization is terminated or revoked.  Performed at [facility], [address] [place], [place] [address]   Body fluid culture w Gram Stain     Status: None   Collection Time: 07/28/21  1:53 PM   Specimen: PATH Cytology Pleural fluid  Result Value Ref Range Status   Specimen Description   Final    PLEURAL Performed at [facility], [address] [place], [place] [address]    Special Requests   Final    NONE Performed at [facility], [address] [place], [place] [address]    Gram Stain   Final    NO SQUAMOUS EPITHELIAL CELLS SEEN FEW WBC SEEN NO ORGANISMS SEEN    Culture   Final    NO GROWTH 3 DAYS Performed at [facility], [place] [address], [place], [place] [address]    Report Status 08/01/2021 FINAL  Final  Body  fluid culture w Gram Stain     Status: None   Collection Time: 08/01/21  3:36 PM   Specimen: PATH Cytology Peritoneal fluid  Result Value Ref Range Status   Specimen Description   Final    PERITONEAL Performed at [facility], [address] [place], [place] [address]    Special Requests   Final    NONE Performed at [facility], [address] [place], [place] [address]    Gram Stain   Final    RARE WBC PRESENT,BOTH PMN AND MONONUCLEAR NO ORGANISMS SEEN    Culture   Final    NO GROWTH 3 DAYS Performed at [facility], [place] [address], [place], [place] [address]    Report Status 08/05/2021 FINAL  Final  MRSA Next Gen by PCR, Nasal     Status: None   Collection Time: 08/04/21  8:56 PM   Specimen: Nasal Mucosa; Nasal Swab  Result Value Ref Range Status   MRSA by PCR Next Gen NOT DETECTED NOT DETECTED Final    Comment: (NOTE) The GeneXpert MRSA Assay (FDA approved for NASAL specimens only), is one component of a comprehensive MRSA colonization  surveillance program. It is not intended to diagnose MRSA infection nor to guide or monitor treatment for MRSA infections. Test performance is not FDA approved in patients less than 45 years old. Performed at [facility], [address] [place], [place] [address]   Body fluid culture w Gram Stain     Status: None   Collection Time: 08/05/21  5:50 PM   Specimen: Pericardial  Result Value Ref Range Status   Specimen Description   Final    PERICARDIAL Performed at [facility], [address] [place], [place] [address]    Special Requests   Final    NONE Performed at [facility], [address] [place], [place] [address]    Gram Stain   Final    FEW WBC PRESENT, PREDOMINANTLY MONONUCLEAR NO ORGANISMS SEEN    Culture   Final    NO GROWTH 3 DAYS Performed at [facility], [place] [address], [place], [place] [address]    Report Status 08/09/2021 FINAL  Final  Acid Fast Smear (AFB)     Status: None   Collection Time: 08/16/21 11:46 AM   Specimen: PATH Cytology Pleural fluid  Result Value Ref Range Status   AFB Specimen Processing Concentration  Final   Acid Fast Smear Negative  Final    Comment: (NOTE) Performed At: [facility] [place], [place] [id number] Rush Farmer MD [id number]    Source (AFB) PLEURAL  Final    Comment: Performed at [facility], [address] [place], [place] [address]  Body fluid culture w Gram Stain     Status: None (Preliminary result)   Collection Time: 08/16/21 11:46 AM   Specimen: PATH Cytology Pleural fluid  Result Value Ref Range Status   Specimen Description   Final    PLEURAL Performed at [facility], [address] [place], [place] [address]    Special Requests   Final    NONE Performed at [facility], [address] [place], [place] [address]    Gram Stain   Final    WBC PRESENT, PREDOMINANTLY MONONUCLEAR NO ORGANISMS SEEN CYTOSPIN  SMEAR    Culture   Final    NO GROWTH 2 DAYS Performed at [facility], [place]  [address] [place], [place] [address]    Report Status PENDING  Incomplete    Coagulation Studies: No results for input(s): LABPROT, INR in the last 72 hours.   Urinalysis: No results for input(s): COLORURINE, LABSPEC, PHURINE, GLUCOSEU, HGBUR, BILIRUBINUR, KETONESUR, PROTEINUR, UROBILINOGEN, NITRITE, LEUKOCYTESUR in the last 72 hours.  Invalid input(s): APPERANCEUR    Imaging: IR Fluoro Guide CV Line Right  Result Date: 08/16/2021 INDICATION: Need for central venous access EXAM: Ultrasound-guided puncture of the right external jugular vein Placement of a temporary central venous catheter using fluoroscopic guidance MEDICATIONS: None ANESTHESIA/SEDATION: Local analgesia FLUOROSCOPY TIME:  Fluoroscopy Time: 18 seconds with 2 exposures COMPLICATIONS: None immediate. PROCEDURE: Informed written consent was obtained from the patient after a thorough discussion of the procedural risks, benefits and alternatives. All questions were addressed. Maximal Sterile Barrier Technique was utilized including caps, mask, sterile gowns, sterile gloves, sterile drape, hand hygiene and skin antiseptic. A timeout was performed prior to the initiation of the procedure. The patient was placed supine on the exam table. Prior to prepping the neck, limited ultrasound evaluation of the bilateral neck demonstrated no identifiable internal jugular veins. A right external jugular vein was identified and determined to be the best target for access. The right neck and chest was prepped and draped in the standard sterile fashion. Ultrasound was used to evaluate the right external jugular vein, which found to be patent and suitable for access. An ultrasound image was permanently stored in the electronic medical record. Using ultrasound guidance, the right external jugular vein was directly punctured using a 21 gauge micropuncture set. An 018  wire was advanced into the SVC, followed by serial tract dilation and placement of a 7 French triple-lumen central venous catheter which was advanced into the central veins, such that the tip was positioned in the right atrium. The line was found to flush and aspirate appropriately. It was sutured to the skin using 0 silk suture. A sterile dressing was placed. The patient tolerated the procedure well without immediate complication. IMPRESSION: 1. Successful placement of a 7 French triple-lumen temporary central venous catheter using the right external jugular vein. The line is ready for immediate use. 2. Of note, ultrasound evaluation of the neck demonstrates no identifiable internal jugular veins. Electronically Signed   By: Albin Felling M.D.   On: 08/16/2021 14:07   DG Chest Port 1 View  Result Date: 08/16/2021 CLINICAL DATA:  Post thoracentesis EXAM: PORTABLE CHEST 1 VIEW COMPARISON:  Portable exam 1340 hours compared to 08/14/2021 FINDINGS: RIGHT jugular line with tip projecting over RIGHT atrium. Enlargement of cardiac silhouette with pulmonary vascular congestion. Decreased LEFT pleural effusion and basilar atelectasis post thoracentesis. No pneumothorax. Small persistent RIGHT pleural effusion and mild RIGHT basilar atelectasis. Osseous structures unremarkable. IMPRESSION: No pneumothorax following LEFT thoracentesis. Enlargement of cardiac silhouette with small bibasilar pleural effusions and atelectasis. Electronically Signed   By: Lavonia Dana M.D.   On: 08/16/2021 13:55   IR THORACENTESIS ASP PLEURAL SPACE W/IMG GUIDE  Result Date: 08/16/2021 INDICATION: Left pleural effusion EXAM: ULTRASOUND GUIDED LEFT THORACENTESIS MEDICATIONS: None. COMPLICATIONS: None immediate. PROCEDURE: An ultrasound guided thoracentesis was thoroughly discussed with the patient and questions answered. The benefits, risks, alternatives and complications were also discussed. The patient understands and wishes to proceed  with the procedure. Written consent was obtained. Ultrasound was performed to localize and mark an adequate pocket of fluid in the left chest. The area was then prepped and draped in the normal sterile fashion. 1% Lidocaine was used for  local anesthesia. Under ultrasound guidance a 19 gauge Yueh catheter was introduced. Thoracentesis was performed. The catheter was removed and a dressing applied. FINDINGS: A total of approximately 700 mL of clear yellow fluid was removed. Samples were sent to the laboratory as requested by the clinical team. IMPRESSION: Successful ultrasound guided left thoracentesis yielding 700 mL of clear pleural fluid. Electronically Signed   By: Albin Felling M.D.   On: 08/16/2021 14:00     Medications:    sodium chloride Stopped (08/18/21 0325)   furosemide (LASIX) 200 mg in dextrose 5% 100 mL (2mg /mL) infusion 8 mg/hr (08/17/21 1700)    apixaban  5 mg Oral BID   Chlorhexidine Gluconate Cloth  6 each Topical Daily   docusate sodium  100 mg Oral Daily   folic acid  1 mg Oral Daily   hydrocortisone   Rectal TID   insulin aspart  0-5 Units Subcutaneous QHS   insulin aspart  0-9 Units Subcutaneous TID WC   iron polysaccharides  150 mg Oral Daily   latanoprost  1 drop Both Eyes QHS   metoprolol tartrate  12.5 mg Oral Q6H   midodrine  10 mg Oral TID WC   pantoprazole (PROTONIX) IV  40 mg Intravenous Q12H   polyethylene glycol  17 g Oral Daily   Ensure Max Protein  11 oz Oral BID   spironolactone  25 mg Oral Daily   vitamin B-12  500 mcg Oral Daily     Assessment/ Plan:  Karen Farley is a 75 y.o. white female with diastolic congestive heart failure, hypertension, hyperlipidemia, lymphedema, atrial fibrillation, peripheral vascular disease who is admitted to [facility] on 07/26/2021 for Morbid obesity ([place]) [E66.01] DOE (dyspnea on exertion) [R06.09] Recurrent left pleural effusion [J90] Hypotension [I95.9] Type 2 diabetes mellitus without complication, without  long-term current use of insulin ([place]) [E11.9] Atrial fibrillation, unspecified type ([place]) [I48.91] Chronic congestive heart failure, unspecified heart failure type ([place]) [I50.9]  #Acute kidney injury on chronic kidney disease stage IIIB: with baseline creatinine of 1.66, GFR of 36 on 07/13/21. History of bland urine. 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 Hold benazepril. Creatinine remains at baseline Will continue Furosemide infusion 8 mg/hr  #Hypotension Blood Pressure 121/66 this morning Will continue monitoring closely  #Acute exacerbation of diastolic congestive heart failure:  Repeat echocardiogram performed 2 days ago with moderate pericardial effusion, Cardiology team following No acute respiratory distress, no supplemental Or requirement noted today  # Hyponatremia: with hypervolumia.  Na+ 137 today       LOS: 23 Karen Farley 10/29/202211:18 AM

## 2021-08-19 ENCOUNTER — Inpatient Hospital Stay (HOSPITAL_COMMUNITY)
Admit: 2021-08-19 | Discharge: 2021-08-19 | Disposition: A | Discharge status: Home / Self Care | Payer: Medicare PPO | Attending: Physician Assistant | Admitting: Physician Assistant

## 2021-08-19 DIAGNOSIS — I5031 Acute diastolic (congestive) heart failure: Secondary | ICD-10-CM | POA: Diagnosis not present

## 2021-08-19 DIAGNOSIS — I272 Pulmonary hypertension, unspecified: Secondary | ICD-10-CM

## 2021-08-19 DIAGNOSIS — I3139 Other pericardial effusion (noninflammatory): Secondary | ICD-10-CM | POA: Diagnosis not present

## 2021-08-19 DIAGNOSIS — I4819 Other persistent atrial fibrillation: Secondary | ICD-10-CM | POA: Diagnosis not present

## 2021-08-19 DIAGNOSIS — D62 Acute posthemorrhagic anemia: Secondary | ICD-10-CM | POA: Diagnosis not present

## 2021-08-19 DIAGNOSIS — J90 Pleural effusion, not elsewhere classified: Secondary | ICD-10-CM

## 2021-08-19 DIAGNOSIS — J9601 Acute respiratory failure with hypoxia: Secondary | ICD-10-CM

## 2021-08-19 LAB — CBC
HCT: 29.6 % — ABNORMAL LOW (ref 36.0–46.0)
Hemoglobin: 9.6 g/dL — ABNORMAL LOW (ref 12.0–15.0)
MCH: 28.8 pg (ref 26.0–34.0)
MCHC: 32.4 g/dL (ref 30.0–36.0)
MCV: 88.9 fL (ref 80.0–100.0)
Platelets: 351 10*3/uL (ref 150–400)
RBC: 3.33 MIL/uL — ABNORMAL LOW (ref 3.87–5.11)
RDW: 26.3 % — ABNORMAL HIGH (ref 11.5–15.5)
WBC: 6.2 10*3/uL (ref 4.0–10.5)
nRBC: 0 % (ref 0.0–0.2)

## 2021-08-19 LAB — BODY FLUID CULTURE W GRAM STAIN: Culture: NO GROWTH

## 2021-08-19 LAB — GLUCOSE, CAPILLARY
Glucose-Capillary: 164 mg/dL — ABNORMAL HIGH (ref 70–99)
Glucose-Capillary: 220 mg/dL — ABNORMAL HIGH (ref 70–99)
Glucose-Capillary: 195 mg/dL — ABNORMAL HIGH (ref 70–99)
Glucose-Capillary: 171 mg/dL — ABNORMAL HIGH (ref 70–99)

## 2021-08-19 LAB — COMPREHENSIVE METABOLIC PANEL
ALT: 16 U/L (ref 0–44)
AST: 22 U/L (ref 15–41)
Albumin: 3.8 g/dL (ref 3.5–5.0)
Alkaline Phosphatase: 71 U/L (ref 38–126)
Anion gap: 10 (ref 5–15)
BUN: 41 mg/dL — ABNORMAL HIGH (ref 8–23)
CO2: 36 mmol/L — ABNORMAL HIGH (ref 22–32)
Calcium: 9.2 mg/dL (ref 8.9–10.3)
Chloride: 93 mmol/L — ABNORMAL LOW (ref 98–111)
Creatinine, Ser: 1.94 mg/dL — ABNORMAL HIGH (ref 0.44–1.00)
GFR, Estimated: 27 mL/min — ABNORMAL LOW (ref >60)
Glucose, Bld: 155 mg/dL — ABNORMAL HIGH (ref 70–99)
Potassium: 2.9 mmol/L — ABNORMAL LOW (ref 3.5–5.1)
Sodium: 139 mmol/L (ref 135–145)
Total Bilirubin: 2.5 mg/dL — ABNORMAL HIGH (ref 0.3–1.2)
Total Protein: 6.2 g/dL — ABNORMAL LOW (ref 6.5–8.1)

## 2021-08-19 LAB — ECHOCARDIOGRAM LIMITED
Height: 62 in
S' Lateral: 1.8 cm
Weight: 3283.97 oz

## 2021-08-19 LAB — MAGNESIUM: Magnesium: 2.3 mg/dL (ref 1.7–2.4)

## 2021-08-19 LAB — PHOSPHORUS: Phosphorus: 3 mg/dL (ref 2.5–4.6)

## 2021-08-19 MED ORDER — POTASSIUM CHLORIDE CRYS ER 20 MEQ PO TBCR
40.0000 meq | EXTENDED_RELEASE_TABLET | Freq: Two times a day (BID) | ORAL | Status: DC
  Administered 2021-08-19 – 2021-08-20 (×3): 40 meq via ORAL
  Filled 2021-08-19 (×3): qty 4

## 2021-08-19 MED ORDER — POTASSIUM CHLORIDE 10 MEQ/50ML IV SOLN
10.0000 meq | INTRAVENOUS | Status: DC
  Filled 2021-08-19: qty 50

## 2021-08-19 NOTE — Progress Notes (Signed)
[facility]  ROUNDING NOTE   Subjective:   [name] was admitted to [facility] on 07/26/2021 for Morbid obesity ([place]) [E66.01] DOE (dyspnea on exertion) [R06.09] Recurrent left pleural effusion [J90] Hypotension [I95.9] Type 2 diabetes mellitus without complication, without long-term current use of insulin ([place]) [E11.9] Atrial fibrillation, unspecified type ([place]) [I48.91] Chronic congestive heart failure, unspecified heart failure type ([place]) [I50.9]  Patient sitting on a recliner near the bed,.She reports she is feeling better. She is diuresing well on Lasix infusion 8 mg/hr.  Urine Output 2,250 for the preceding 24 hours + 900 cc today Standing weight 93 kg  Objective:  Vital signs in last 24 hours:  Temp:  [97.6 F (36.4 C)-99.4 F (37.4 C)] 98 F (36.7 C) (10/30 0811) Pulse Rate:  [67-102] 102 (10/30 0811) Resp:  [16-17] 17 (10/30 0811) BP: (82-108)/(50-70) 108/70 (10/30 0811) SpO2:  [93 %-99 %] 95 % (10/30 0811) Weight:  [93.1 kg] 93.1 kg (10/30 0100)  Weight change: 1.882 kg Filed Weights   08/17/21 0341 08/18/21 0522 08/19/21 0100  Weight: 98 kg 91.2 kg 93.1 kg    Intake/Output: I/O last 3 completed shifts: In: -  Out: 3850 [Urine:3850]   Intake/Output this shift:  Total I/O In: -  Out: 900 [Urine:900]  Physical Exam: General: In no acute distress, appears pleasant  Head: oral mucosal membranes moist  Lungs:  Respirations slightly labored with exertion, fine crackles at the bases  Heart: S1S2, irregular  Abdomen:  Non tender  Extremities:  ++ peripheral edema. Anasarca +  Neurologic: Awake, alert, able to answer questions appropriately  Skin: No acute lesions or rashes        Basic Metabolic Panel: Recent Labs  Lab 08/15/21 0429 08/15/21 0906 08/15/21 1530 08/16/21 0601 08/17/21 1415 08/18/21 0637 08/19/21 0635  NA 135 135  --  134* 135 137 139  K 3.3* 3.1*  --  3.4* 2.8* 3.1* 2.9*  CL 96* 95*  --  95* 90* 93* 93*  CO2  30 28  --  28 31 34* 36*  GLUCOSE 145* 159*  --  206* 206* 161* 155*  BUN 33* 31*  --  40* 40* 44* 41*  CREATININE 1.79* 1.72*  --  2.01* 2.06* 1.85* 1.94*  CALCIUM 9.0 9.2  --  9.1 9.5 9.4 9.2  MG 2.2  --  2.1 2.0 1.8 2.7* 2.3  PHOS 2.7  --   --  3.3 2.9 3.4 3.0     Liver Function Tests: Recent Labs  Lab 08/15/21 0906 08/16/21 0601 08/17/21 1415 08/18/21 0637 08/19/21 0635  AST 27 28 34 26 22  ALT 17 18 21 19 16   ALKPHOS 73 70 77 74 71  BILITOT 2.7* 2.0* 2.4* 2.2* 2.5*  PROT 6.5 5.9* 6.3* 6.1* 6.2*  ALBUMIN 4.0 3.8 3.8 3.8 3.8    No results for input(s): LIPASE, AMYLASE in the last 168 hours. No results for input(s): AMMONIA in the last 168 hours.  CBC: Recent Labs  Lab 08/15/21 0906 08/16/21 0601 08/17/21 1415 08/18/21 0637 08/19/21 0635  WBC 6.6 6.7 6.2 5.6 6.2  HGB 10.4* 10.0* 10.1* 9.7* 9.6*  HCT 33.1* 32.3* 31.1* 30.6* 29.6*  MCV 89.5 90.7 88.1 89.0 88.9  PLT 287 298 369 341 351     Cardiac Enzymes: No results for input(s): CKTOTAL, CKMB, CKMBINDEX, TROPONINI in the last 168 hours.  BNP: Invalid input(s): POCBNP  CBG: Recent Labs  Lab 08/18/21 0759 08/18/21 1133 08/18/21 1639 08/18/21 2313 08/19/21  [place]     Microbiology: Results for orders placed or performed during the hospital encounter of 07/26/21  Resp Panel by RT-PCR (Flu A&B, Covid) Nasopharyngeal Swab     Status: None   Collection Time: 07/26/21  7:19 PM   Specimen: Nasopharyngeal Swab; Nasopharyngeal(NP) swabs in vial transport medium  Result Value Ref Range Status   SARS Coronavirus 2 by RT PCR NEGATIVE NEGATIVE Final    Comment: (NOTE) SARS-CoV-2 target nucleic acids are NOT DETECTED.  The SARS-CoV-2 RNA is generally detectable in upper respiratory specimens during the acute phase of infection. The lowest concentration of SARS-CoV-2 viral copies this assay can detect is 138 copies/mL. A negative result does not preclude SARS-Cov-2 infection  and should not be used as the sole basis for treatment or other patient management decisions. A negative result may occur with  improper specimen collection/handling, submission of specimen other than nasopharyngeal swab, presence of viral mutation(s) within the areas targeted by this assay, and inadequate number of viral copies(<138 copies/mL). A negative result must be combined with clinical observations, patient history, and epidemiological information. The expected result is Negative.  Fact Sheet for Patients:  EntrepreneurPulse.com.au  Fact Sheet for Healthcare Providers:  IncredibleEmployment.be  This test is no t yet approved or cleared by the [place] FDA and  has been authorized for detection and/or diagnosis of SARS-CoV-2 by FDA under an Emergency Use Authorization (EUA). This EUA will remain  in effect (meaning this test can be used) for the duration of the COVID-19 declaration under Section 564(b)(1) of the Act, 21 U.S.C.section 360bbb-3(b)(1), unless the authorization is terminated  or revoked sooner.       Influenza A by PCR NEGATIVE NEGATIVE Final   Influenza B by PCR NEGATIVE NEGATIVE Final    Comment: (NOTE) The Xpert Xpress SARS-CoV-2/FLU/RSV plus assay is intended as an aid in the diagnosis of influenza from Nasopharyngeal swab specimens and should not be used as a sole basis for treatment. Nasal washings and aspirates are unacceptable for Xpert Xpress SARS-CoV-2/FLU/RSV testing.  Fact Sheet for Patients: EntrepreneurPulse.com.au  Fact Sheet for Healthcare Providers: IncredibleEmployment.be  This test is not yet approved or cleared by the [place] FDA and has been authorized for detection and/or diagnosis of SARS-CoV-2 by FDA under an Emergency Use Authorization (EUA). This EUA will remain in effect (meaning this test can be used) for the duration of the COVID-19 declaration  under Section 564(b)(1) of the Act, 21 U.S.C. section 360bbb-3(b)(1), unless the authorization is terminated or revoked.  Performed at [facility], [address] [place], [place] [address]   Body fluid culture w Gram Stain     Status: None   Collection Time: 07/28/21  1:53 PM   Specimen: PATH Cytology Pleural fluid  Result Value Ref Range Status   Specimen Description   Final    PLEURAL Performed at [facility], [address] [place], [place] [address]    Special Requests   Final    NONE Performed at [facility], [address] [place], [place] [address]    Gram Stain   Final    NO SQUAMOUS EPITHELIAL CELLS SEEN FEW WBC SEEN NO ORGANISMS SEEN    Culture   Final    NO GROWTH 3 DAYS Performed at [facility], [place] [address] [place], [place] [address]    Report Status 08/01/2021 FINAL  Final  Body fluid culture w Gram Stain  Status: None   Collection Time: 08/01/21  3:36 PM   Specimen: PATH Cytology Peritoneal fluid  Result Value Ref Range Status   Specimen Description   Final    PERITONEAL Performed at [facility], [address], [place], [place] [address]    Special Requests   Final    NONE Performed at [facility], [place], [place] [address]    Gram Stain   Final    RARE WBC PRESENT,BOTH PMN AND MONONUCLEAR NO ORGANISMS SEEN    Culture   Final    NO GROWTH 3 DAYS Performed at [facility], [place] [address] [place], [place] [address]    Report Status 08/05/2021 FINAL  Final  MRSA Next Gen by PCR, Nasal     Status: None   Collection Time: 08/04/21  8:56 PM   Specimen: Nasal Mucosa; Nasal Swab  Result Value Ref Range Status   MRSA by PCR Next Gen NOT DETECTED NOT DETECTED Final    Comment: (NOTE) The GeneXpert MRSA Assay (FDA approved for NASAL specimens only), is one component of a comprehensive MRSA colonization surveillance program. It is not intended to diagnose MRSA  infection nor to guide or monitor treatment for MRSA infections. Test performance is not FDA approved in patients less than 76 years old. Performed at [facility], [address] [place], [place] [address]   Body fluid culture w Gram Stain     Status: None   Collection Time: 08/05/21  5:50 PM   Specimen: Pericardial  Result Value Ref Range Status   Specimen Description   Final    PERICARDIAL Performed at [facility], [address], [place], [place] [address]    Special Requests   Final    NONE Performed at [facility], [address] [place], [place] [address]    Gram Stain   Final    FEW WBC PRESENT, PREDOMINANTLY MONONUCLEAR NO ORGANISMS SEEN    Culture   Final    NO GROWTH 3 DAYS Performed at [facility], [place] [address], [place], [place] [address]    Report Status 08/09/2021 FINAL  Final  Acid Fast Smear (AFB)     Status: None   Collection Time: 08/16/21 11:46 AM   Specimen: PATH Cytology Pleural fluid  Result Value Ref Range Status   AFB Specimen Processing Concentration  Final   Acid Fast Smear Negative  Final    Comment: (NOTE) Performed At: [facility] [place], [place] [id number] Rush Farmer MD [id number]    Source (AFB) PLEURAL  Final    Comment: Performed at [facility], [address], [place], [place] [address]  Body fluid culture w Gram Stain     Status: None   Collection Time: 08/16/21 11:46 AM   Specimen: PATH Cytology Pleural fluid  Result Value Ref Range Status   Specimen Description   Final    PLEURAL Performed at [facility], [address], [place], [place] [address]    Special Requests   Final    NONE Performed at [facility], [address] [place], [place] [address]    Gram Stain   Final    WBC PRESENT, PREDOMINANTLY MONONUCLEAR NO ORGANISMS SEEN CYTOSPIN SMEAR    Culture   Final    NO GROWTH 3 DAYS Performed at [place], [place] [address] [place], [place] [address]    Report Status 08/19/2021 FINAL  Final    Coagulation Studies: No results for input(s): LABPROT, INR in the last 72 hours.   Urinalysis: No results for input(s): COLORURINE, LABSPEC, PHURINE, GLUCOSEU, HGBUR, BILIRUBINUR, KETONESUR, PROTEINUR, UROBILINOGEN, NITRITE, LEUKOCYTESUR in the last 72 hours.  Invalid input(s): APPERANCEUR    Imaging: No results found.   Medications:    sodium chloride Stopped (08/18/21 0325)   furosemide (LASIX) 200 mg in dextrose 5% 100 mL (2mg /mL) infusion 8 mg/hr (08/17/21 1700)    apixaban  5 mg Oral BID   Chlorhexidine Gluconate Cloth  6 each Topical Daily   docusate sodium  100 mg Oral Daily   folic acid  1 mg Oral Daily   hydrocortisone   Rectal TID   insulin aspart  0-5 Units Subcutaneous QHS   insulin aspart  0-9 Units Subcutaneous TID WC   iron polysaccharides  150 mg Oral Daily   latanoprost  1 drop Both Eyes QHS   metoprolol tartrate  12.5 mg Oral Q6H   midodrine  10 mg Oral TID WC   pantoprazole (PROTONIX) IV  40 mg Intravenous Q12H   polyethylene glycol  17 g Oral Daily   Ensure Max Protein  11 oz Oral BID   spironolactone  25 mg Oral Daily   vitamin B-12  500 mcg Oral Daily     Assessment/ Plan:  [name] is a 75 y.o. white female with diastolic congestive heart failure, hypertension, hyperlipidemia, lymphedema, atrial fibrillation, peripheral vascular disease who is admitted to [facility] on 07/26/2021 for Morbid obesity ([place]) [E66.01] DOE (dyspnea on exertion) [R06.09] Recurrent left pleural effusion [J90] Hypotension [I95.9] Type 2 diabetes mellitus without complication, without long-term current use of insulin ([place]) [E11.9] Atrial fibrillation, unspecified type ([place]) [I48.91] Chronic congestive heart failure, unspecified heart failure type ([place]) [I50.9]  #Acute kidney injury on chronic kidney disease stage IIIB: with baseline creatinine of 1.66, GFR of 36 on 07/13/21.  History of bland urine. 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 Hold benazepril. Creatinine remains at baseline Will continue Furosemide infusion 8 mg/hr  #Hypotension Blood Pressure 121/66 this morning Will continue monitoring closely Continued on midodrine 10 mg 3 times a day  #Acute exacerbation of diastolic congestive heart failure:  Repeat echocardiogram performed 2 days ago with moderate pericardial effusion, Cardiology team following No acute respiratory distress, no supplemental Or requirement noted today        LOS: 24 Nyko Gell 10/30/202210:03 AM

## 2021-08-19 NOTE — Progress Notes (Signed)
PROGRESS NOTE    Karen Farley  WNI:[id number] DOB: 1946/07/25 DOA: 07/26/2021 PCP: Valerie Roys, DO   Brief Narrative:  75 y.o. WF PMHx HFpEF, permanent atrial fibrillation on Eliquis, PAD, type 2 diabetes, CKD stage IIIb, lymphedema   Presented to hospital with increasing shortness of breath and edema.  Patient has been noticing increasing abdominal distention since her admission in August with severe lower extremity edema and dyspnea and exertion.   Subjective: 10/30 A/O x4,    Assessment & Plan:  Covid vaccination; vaccinated 3/3  Principal Problem:   Acute blood loss anemia Active Problems:   Type 2 diabetes mellitus with renal complication (HCC)   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 diabetes mellitus (HCC)   Atrial fibrillation (HCC)   Acute heart failure with preserved ejection fraction (HFpEF) (HCC)   Pleural effusion   Acute on chronic diastolic CHF (congestive heart failure) (HCC)   Chronic anticoagulation   Shock circulatory (HCC)   Pericardial effusion  GI bleed with hemorrhagic shock. --Patient was noted to have significant drop in hemoglobin to 9.7 from 12.4 and was transferred to the ICU.   --GI Dr Alice Reichert from 10/19-- May forego endoluminal evaluation for the near future given lack of bleeding recurrence and persistence of cardiopulmonary issues. Patient is in agreement. --10/23-- d/w Dr Russo--no Rectal bleed. Ok to resume po eliquis   Large pericardial effusion  -S/p percardiocentesis  Large Pleural effusion s/p thoracentesis Patient underwent a pericardiocentesis on 08/05/2021 with removal of 700 mL of pleural fluid.  Inflammatory markers were unremarkable.  Cardiology monitoring the patient while in the hospital.  Still on vasopressors due to hypotension. --repeat echo 10/21--no pericaridal effusion  Acute on chronic diastolic CHF -Strict in and out -17 L (10/30) - Daily weight Filed Weights   08/17/21 0341 08/18/21 0522 08/19/21 0100  Weight: 98 kg  91.2 kg 93.1 kg    Severe Anasarca -status post thoracocentesis with removal of 600 mL of fluid on 07/28/2021. --10/20-- torsemide 20 mg qd with IV albumin --10/21-- IV lqasix 40 mg bid per cardiology --10/22 cont IV lasix--good uop --10/23--IV lasix --10/24-- Dr Juleen China wants to start IV lasxi gtt --10/25-cont Lasix gtt     Hypotension suspect  -Secondary to hemorrhagic shock and low intravsacular volume --Antihypertensives on hold.   --cont po  midodrine.   --IV Albumin       Acute kidney injury on CKD stage 3b(Baseline Cr 1.4-1.7) Lab Results  Component Value Date   CREATININE 1.94 (H) 08/19/2021   CREATININE 1.85 (H) 08/18/2021   CREATININE 2.06 (H) 08/17/2021   CREATININE 2.01 (H) 08/16/2021   CREATININE 1.72 (H) 08/15/2021  -Currently at baseline.   --Nephrology followed the patient during hospitalization.  -- Benazepril and spironolactone on hold.   Permanent atrial fibrillation --Was previously on Eliquis.  Currently on hold due to GI bleed.  -- Rate controlled at this time--not on any po rate controlling meds --10/21--started on IV heparin gtt--monitor hgb  --10/22-- hgb remains stable --10/23-- discussed with G.I. Dr. Virgina Jock and cardiology Dr. Rockey Situ plan is to move forward with starting eliquis while patient is in-house to make sure she tolerates. DC heparin drip. Patient is in agreement with plan. Lab Results  Component Value Date   HGB 9.6 (L) 08/20/2021   HGB 9.6 (L) 08/19/2021   HGB 9.7 (L) 08/18/2021   HGB 10.1 (L) 08/17/2021   HGB 10.0 (L) 08/16/2021  -Stable  Pulmonary HTN - See A. fib  DM type II  with hyperglycemia Diet controlled at home.   -- Sensitive SSI  Iron deficiency/Vitamin B12  --patient received Venofer -cont oral iron and vitamin B12 supplements.  Hypokalemia -Potassium goal> 4 - 10/30 potassium IV 60 mEq - 10/30  K-Dur 40 mEq BID  Hypomagnesmia - Magnesium goal> 2 - 10/28 Magnesium IV 3 g   Morbidly Obese  Body mass  index is 37.54 kg/m.     DVT prophylaxis: Eliquis  Code Status: Full Family Communication: 10/30 husband at bedside for discussion plan of care all questions answered Status is: Inpatient    Dispo: The patient is from: Home              Anticipated d/c is to: Home              Anticipated d/c date is: > 3 days              Patient currently is not medically stable to d/c.      Consultants:  Nephrology IR    Procedures/Significant Events:  10/8 s/p left thoracentesis: 630ml aspirated 10/12 paracentesis 1.2L aspirated .  10/16 Echocardiogram performed: Pericardiocentesis 730ml aspirated and drain placed  Failed Lasix IV advanced to Lasix gtt; however, Lasix and Eliquis stopped 10/15 and patient transferred to ICU after had episode of GI bleed with drop in hgb, s/p 2U pRBC and vasopressor support initiated.    10/21 Echocardiogram LVEF= 60 to 65%.  -Pulmonary HTN: Assumed right atrial pressure of 15 mmHg, the  estimated right ventricular systolic pressure is 99.8 mmHg.  Pericardium: A small pericardial effusion is present.  10/27 LEFT thoracentesis: Aspirated 700 mL of clear yellow fluid was removed      I have personally reviewed and interpreted all radiology studies and my findings are as above.  VENTILATOR SETTINGS:    Cultures   Antimicrobials: Anti-infectives (From admission, onward)    None         Devices    LINES / TUBES:      Continuous Infusions:  sodium chloride Stopped (08/18/21 0325)   furosemide (LASIX) 200 mg in dextrose 5% 100 mL (2mg /mL) infusion 8 mg/hr (08/17/21 1700)     Objective: Vitals:   08/18/21 2338 08/19/21 0100 08/19/21 0628 08/19/21 0811  BP: 100/70  (!) 92/51 108/70  Pulse: 84  84 (!) 102  Resp: 16   17  Temp: 97.6 F (36.4 C)   98 F (36.7 C)  TempSrc:      SpO2: 98%  93% 95%  Weight:  93.1 kg    Height:        Intake/Output Summary (Last 24 hours) at 08/19/2021 0837 Last data filed at  08/19/2021 0813 Gross per 24 hour  Intake --  Output 3150 ml  Net -3150 ml    Filed Weights   08/17/21 0341 08/18/21 0522 08/19/21 0100  Weight: 98 kg 91.2 kg 93.1 kg    Examination:  General: A/O x4 No acute respiratory distress Eyes: negative scleral hemorrhage, negative anisocoria, negative icterus ENT: Negative Runny nose, negative gingival bleeding, Neck:  Negative scars, masses, torticollis, lymphadenopathy, JVD Lungs: Clear to auscultation bilaterally without wheezes or crackles Cardiovascular: Regular rate and rhythm without murmur gallop or rub normal S1 and S2 Abdomen: Positive MORBID OBESITY, positive ANASARCA abdominal pain, nondistended, positive soft, bowel sounds, no rebound, no ascites, no appreciable mass Extremities: POSITIVE bilateral lower extremity edema (ANASARCA) Skin: Negative rashes, lesions, ulcers Psychiatric:  Negative depression, negative anxiety, negative fatigue, negative mania  Central nervous system:  Cranial nerves II through XII intact, tongue/uvula midline, all extremities muscle strength 5/5, sensation intact throughout, negative dysarthria, negative expressive aphasia, negative receptive aphasia.  .     Data Reviewed: Care during the described time interval was provided by me .  I have reviewed this patient's available data, including medical history, events of note, physical examination, and all test results as part of my evaluation.   CBC: Recent Labs  Lab 08/15/21 0906 08/16/21 0601 08/17/21 1415 08/18/21 0637 08/19/21 0635  WBC 6.6 6.7 6.2 5.6 6.2  HGB 10.4* 10.0* 10.1* 9.7* 9.6*  HCT 33.1* 32.3* 31.1* 30.6* 29.6*  MCV 89.5 90.7 88.1 89.0 88.9  PLT 287 298 369 341 970    Basic Metabolic Panel: Recent Labs  Lab 08/15/21 0429 08/15/21 0906 08/15/21 1530 08/16/21 0601 08/17/21 1415 08/18/21 0637 08/19/21 0635  NA 135 135  --  134* 135 137 139  K 3.3* 3.1*  --  3.4* 2.8* 3.1* 2.9*  CL 96* 95*  --  95* 90* 93* 93*  CO2  30 28  --  28 31 34* 36*  GLUCOSE 145* 159*  --  206* 206* 161* 155*  BUN 33* 31*  --  40* 40* 44* 41*  CREATININE 1.79* 1.72*  --  2.01* 2.06* 1.85* 1.94*  CALCIUM 9.0 9.2  --  9.1 9.5 9.4 9.2  MG 2.2  --  2.1 2.0 1.8 2.7* 2.3  PHOS 2.7  --   --  3.3 2.9 3.4 3.0    GFR: Estimated Creatinine Clearance: 26.6 mL/min (A) (by C-G formula based on SCr of 1.94 mg/dL (H)). Liver Function Tests: Recent Labs  Lab 08/15/21 0906 08/16/21 0601 08/17/21 1415 08/18/21 0637 08/19/21 0635  AST 27 28 34 26 22  ALT 17 18 21 19 16   ALKPHOS 73 70 77 74 71  BILITOT 2.7* 2.0* 2.4* 2.2* 2.5*  PROT 6.5 5.9* 6.3* 6.1* 6.2*  ALBUMIN 4.0 3.8 3.8 3.8 3.8    No results for input(s): LIPASE, AMYLASE in the last 168 hours. No results for input(s): AMMONIA in the last 168 hours. Coagulation Profile: No results for input(s): INR, PROTIME in the last 168 hours.  Cardiac Enzymes: No results for input(s): CKTOTAL, CKMB, CKMBINDEX, TROPONINI in the last 168 hours. BNP (last 3 results) No results for input(s): PROBNP in the last 8760 hours. HbA1C: No results for input(s): HGBA1C in the last 72 hours. CBG: Recent Labs  Lab 08/18/21 0759 08/18/21 1133 08/18/21 1639 08/18/21 2313 08/19/21 0812  GLUCAP 154* 211* 232* 174* 164*    Lipid Profile: No results for input(s): CHOL, HDL, LDLCALC, TRIG, CHOLHDL, LDLDIRECT in the last 72 hours. Thyroid Function Tests: No results for input(s): TSH, T4TOTAL, FREET4, T3FREE, THYROIDAB in the last 72 hours. Anemia Panel: Recent Labs    08/17/21 2110  VITAMINB12 977*  FOLATE 28.0  FERRITIN 149  TIBC 346  IRON 35  RETICCTPCT 5.3*    Urine analysis:    Component Value Date/Time   COLORURINE YELLOW (A) 04/11/2021 1215   APPEARANCEUR HAZY (A) 04/11/2021 1215   APPEARANCEUR Clear 06/06/2020 1339   LABSPEC 1.011 04/11/2021 1215   PHURINE 5.0 04/11/2021 1215   GLUCOSEU NEGATIVE 04/11/2021 1215   HGBUR NEGATIVE 04/11/2021 1215   BILIRUBINUR NEGATIVE  04/11/2021 1215   BILIRUBINUR Negative 06/06/2020 1339   KETONESUR NEGATIVE 04/11/2021 1215   PROTEINUR NEGATIVE 04/11/2021 1215   NITRITE NEGATIVE 04/11/2021 1215   LEUKOCYTESUR MODERATE (A) 04/11/2021 1215   Sepsis Labs: @LABRCNTIP (procalcitonin:4,lacticidven:4)  )  Recent Results (from the past 240 hour(s))  Acid Fast Smear (AFB)     Status: None   Collection Time: 08/16/21 11:46 AM   Specimen: PATH Cytology Pleural fluid  Result Value Ref Range Status   AFB Specimen Processing Concentration  Final   Acid Fast Smear Negative  Final    Comment: (NOTE) Performed At: [facility] [place], [place] [id number] Rush Farmer MD TD:[id number]    Source (AFB) PLEURAL  Final    Comment: Performed at [facility], [address] [place], [place] [address]  Body fluid culture w Gram Stain     Status: None (Preliminary result)   Collection Time: 08/16/21 11:46 AM   Specimen: PATH Cytology Pleural fluid  Result Value Ref Range Status   Specimen Description   Final    PLEURAL Performed at [facility], [address] [place], [place] [address]    Special Requests   Final    NONE Performed at [facility], [address] [place], [place] [address]    Gram Stain   Final    WBC PRESENT, PREDOMINANTLY MONONUCLEAR NO ORGANISMS SEEN CYTOSPIN SMEAR    Culture   Final    NO GROWTH 2 DAYS Performed at [facility], [place] [address], [place], [place] [address]    Report Status PENDING  Incomplete          Radiology Studies: No results found.      Scheduled Meds:  apixaban  5 mg Oral BID   Chlorhexidine Gluconate Cloth  6 each Topical Daily   docusate sodium  100 mg Oral Daily   folic acid  1 mg Oral Daily   hydrocortisone   Rectal TID   insulin aspart  0-5 Units Subcutaneous QHS   insulin aspart  0-9 Units Subcutaneous TID WC   iron polysaccharides  150 mg Oral Daily   latanoprost  1 drop Both Eyes QHS    metoprolol tartrate  12.5 mg Oral Q6H   midodrine  10 mg Oral TID WC   pantoprazole (PROTONIX) IV  40 mg Intravenous Q12H   polyethylene glycol  17 g Oral Daily   Ensure Max Protein  11 oz Oral BID   spironolactone  25 mg Oral Daily   vitamin B-12  500 mcg Oral Daily   Continuous Infusions:  sodium chloride Stopped (08/18/21 0325)   furosemide (LASIX) 200 mg in dextrose 5% 100 mL (2mg /mL) infusion 8 mg/hr (08/17/21 1700)     LOS: 24 days   The patient is critically ill with multiple organ systems failure and requires high complexity decision making for assessment and support, frequent evaluation and titration of therapies, application of advanced monitoring technologies and extensive interpretation of multiple databases. Critical Care Time devoted to patient care services described in this note  Time spent: 40 minutes     Tovia Kisner, Geraldo Docker, MD [facility]   If 7PM-7AM, please contact night-coverage 08/19/2021, 8:37 AM

## 2021-08-19 NOTE — Progress Notes (Signed)
Progress Note  Patient Name: Karen Farley Date of Encounter: 08/19/2021  [facility] Cardiologist: Kate Sable, MD   Subjective   Feeling better.  No shortness of breath.  Still weak and tired when walking.   Inpatient Medications    Scheduled Meds:  apixaban  5 mg Oral BID   Chlorhexidine Gluconate Cloth  6 each Topical Daily   docusate sodium  100 mg Oral Daily   folic acid  1 mg Oral Daily   hydrocortisone   Rectal TID   insulin aspart  0-5 Units Subcutaneous QHS   insulin aspart  0-9 Units Subcutaneous TID WC   iron polysaccharides  150 mg Oral Daily   latanoprost  1 drop Both Eyes QHS   metoprolol tartrate  12.5 mg Oral Q6H   midodrine  10 mg Oral TID WC   pantoprazole (PROTONIX) IV  40 mg Intravenous Q12H   polyethylene glycol  17 g Oral Daily   potassium chloride  40 mEq Oral BID   Ensure Max Protein  11 oz Oral BID   spironolactone  25 mg Oral Daily   vitamin B-12  500 mcg Oral Daily   Continuous Infusions:  sodium chloride Stopped (08/18/21 0325)   furosemide (LASIX) 200 mg in dextrose 5% 100 mL (2mg /mL) infusion 8 mg/hr (08/17/21 1700)   potassium chloride     PRN Meds: acetaminophen, bisacodyl, lidocaine HCl (PF), lip balm, traMADol   Vital Signs    Vitals:   08/18/21 2338 08/19/21 0100 08/19/21 0628 08/19/21 0811  BP: 100/70  (!) 92/51 108/70  Pulse: 84  84 (!) 102  Resp: 16   17  Temp: 97.6 F (36.4 C)   98 F (36.7 C)  TempSrc:      SpO2: 98%  93% 95%  Weight:  93.1 kg    Height:        Intake/Output Summary (Last 24 hours) at 08/19/2021 1402 Last data filed at 08/19/2021 1016 Gross per 24 hour  Intake 240 ml  Output 2500 ml  Net -2260 ml   Last 3 Weights 08/19/2021 08/18/2021 08/17/2021  Weight (lbs) 205 lb 4 oz 201 lb 1.6 oz 216 lb 1.6 oz  Weight (kg) 93.1 kg 91.218 kg 98.022 kg      Telemetry    Atrial fibrillation.  Rate <100 bpm.  - Personally Reviewed  ECG    N/a - Personally Reviewed  Physical Exam    VS:  BP 108/70 (BP Location: Right Arm)   Pulse (!) 102   Temp 98 F (36.7 C)   Resp 17   Ht 5\' 2"  (1.575 m)   Wt 93.1 kg   SpO2 95%   BMI 37.54 kg/m  , BMI Body mass index is 37.54 kg/m. GENERAL:  Well appearing HEENT: Pupils equal round and reactive, fundi not visualized, oral mucosa unremarkable NECK:  No jugular venous distention, waveform within normal limits, carotid upstroke brisk and symmetric, no bruits LUNGS:  Clear to auscultation bilaterally HEART:  Irregularly irregular.  PMI not displaced or sustained,S1 and S2 within normal limits, no S3, no S4, no clicks, no rubs, no murmurs ABD:  Flat, positive bowel sounds normal in frequency in pitch, no bruits, no rebound, no guarding, no midline pulsatile mass, no hepatomegaly, no splenomegaly EXT:  2 plus pulses throughout, 2+ LE edema, no cyanosis no clubbing SKIN:  No rashes no nodules NEURO:  Cranial nerves II through XII grossly intact, motor grossly intact throughout PSYCH:  Cognitively intact, oriented to  person place and time   Labs    High Sensitivity Troponin:   Recent Labs  Lab 07/26/21 1708  TROPONINIHS 17     Chemistry Recent Labs  Lab 08/17/21 1415 08/18/21 0637 08/19/21 0635  NA 135 137 139  K 2.8* 3.1* 2.9*  CL 90* 93* 93*  CO2 31 34* 36*  GLUCOSE 206* 161* 155*  BUN 40* 44* 41*  CREATININE 2.06* 1.85* 1.94*  CALCIUM 9.5 9.4 9.2  MG 1.8 2.7* 2.3  PROT 6.3* 6.1* 6.2*  ALBUMIN 3.8 3.8 3.8  AST 34 26 22  ALT 21 19 16   ALKPHOS 77 74 71  BILITOT 2.4* 2.2* 2.5*  GFRNONAA 25* 28* 27*  ANIONGAP 14 10 10     Lipids No results for input(s): CHOL, TRIG, HDL, LABVLDL, LDLCALC, CHOLHDL in the last 168 hours.  Hematology Recent Labs  Lab 08/17/21 1415 08/17/21 2110 08/18/21 0637 08/19/21 0635  WBC 6.2  --  5.6 6.2  RBC 3.53* 3.52* 3.44* 3.33*  HGB 10.1*  --  9.7* 9.6*  HCT 31.1*  --  30.6* 29.6*  MCV 88.1  --  89.0 88.9  MCH 28.6  --  28.2 28.8  MCHC 32.5  --  31.7 32.4  RDW 26.4*  --   26.5* 26.3*  PLT 369  --  341 351   Thyroid No results for input(s): TSH, FREET4 in the last 168 hours.  BNPNo results for input(s): BNP, PROBNP in the last 168 hours.  DDimer No results for input(s): DDIMER in the last 168 hours.   Radiology    No results found.  Cardiac Studies   Echo 08/15/21: IMPRESSIONS    1. Left ventricular ejection fraction, by estimation, is 55 to 60%. The  left ventricle has normal function.   2. Moderate pericardial effusion. The pericardial effusion is posterior  and lateral to the left ventricle and posterior to the left ventricle and  the left atrium. There is no evidence of cardiac tamponade. Large pleural  effusion in the left lateral  region.   3. The inferior vena cava is dilated in size with <50% respiratory  variability, suggesting right atrial pressure of 15 mmHg.   Patient Profile     Ms. Lea is a 13F with chronic diastolic heart failure, permanent atrial fibrillation, CKD 3, hypertension, hyperlipidemia, and obesity admitted with GI bleed and anasarca.  Assessment & Plan    # Acute on chronic diastolic heart failure:  # Pericardial effusion:  Hemorrhagic pericardial effusion status post pericardial drain.  Her echo after the drain was removed showed increased pericardial fluid.  Clinically she is improving.  Repeat limited echo today to assess for improvement.  She continues to diurese on IV Lasix.  She was net -2.2 L yesterday and her renal function is stable.  Continue heparin drip.  Her blood pressure is stable on metoprolol spironolactone and Lasix.  # Permanent atrial fibrillation: Rates are well-controlled on metoprolol.  Continue Eliquis.  #Hypotension: # Hypovolemic shock: She has been hypotensive and has been requiring midodrine.  She is tolerating spironolactone and Lasix as well as metoprolol.  # GI Bleed:  H/h stable on Eliquis.      For questions or updates, please contact [place] Please consult  [email or web address] for contact info under        Signed, Skeet Latch, MD  08/19/2021, 2:02 PM

## 2021-08-20 DIAGNOSIS — I5031 Acute diastolic (congestive) heart failure: Secondary | ICD-10-CM | POA: Diagnosis not present

## 2021-08-20 DIAGNOSIS — I4821 Permanent atrial fibrillation: Secondary | ICD-10-CM | POA: Diagnosis not present

## 2021-08-20 DIAGNOSIS — I3139 Other pericardial effusion (noninflammatory): Secondary | ICD-10-CM | POA: Diagnosis not present

## 2021-08-20 LAB — CBC
HCT: 30.1 % — ABNORMAL LOW (ref 36.0–46.0)
Hemoglobin: 9.6 g/dL — ABNORMAL LOW (ref 12.0–15.0)
MCH: 28.4 pg (ref 26.0–34.0)
MCHC: 31.9 g/dL (ref 30.0–36.0)
MCV: 89.1 fL (ref 80.0–100.0)
Platelets: 356 10*3/uL (ref 150–400)
RBC: 3.38 MIL/uL — ABNORMAL LOW (ref 3.87–5.11)
RDW: 25.7 % — ABNORMAL HIGH (ref 11.5–15.5)
WBC: 5.7 10*3/uL (ref 4.0–10.5)
nRBC: 0 % (ref 0.0–0.2)

## 2021-08-20 LAB — MAGNESIUM: Magnesium: 2.2 mg/dL (ref 1.7–2.4)

## 2021-08-20 LAB — COMPREHENSIVE METABOLIC PANEL
ALT: 16 U/L (ref 0–44)
AST: 19 U/L (ref 15–41)
Albumin: 3.8 g/dL (ref 3.5–5.0)
Alkaline Phosphatase: 71 U/L (ref 38–126)
Anion gap: 10 (ref 5–15)
BUN: 45 mg/dL — ABNORMAL HIGH (ref 8–23)
CO2: 35 mmol/L — ABNORMAL HIGH (ref 22–32)
Calcium: 9.3 mg/dL (ref 8.9–10.3)
Chloride: 93 mmol/L — ABNORMAL LOW (ref 98–111)
Creatinine, Ser: 1.76 mg/dL — ABNORMAL HIGH (ref 0.44–1.00)
GFR, Estimated: 30 mL/min — ABNORMAL LOW (ref >60)
Glucose, Bld: 174 mg/dL — ABNORMAL HIGH (ref 70–99)
Potassium: 3.3 mmol/L — ABNORMAL LOW (ref 3.5–5.1)
Sodium: 138 mmol/L (ref 135–145)
Total Bilirubin: 2.3 mg/dL — ABNORMAL HIGH (ref 0.3–1.2)
Total Protein: 6.1 g/dL — ABNORMAL LOW (ref 6.5–8.1)

## 2021-08-20 LAB — GLUCOSE, CAPILLARY
Glucose-Capillary: 158 mg/dL — ABNORMAL HIGH (ref 70–99)
Glucose-Capillary: 246 mg/dL — ABNORMAL HIGH (ref 70–99)
Glucose-Capillary: 175 mg/dL — ABNORMAL HIGH (ref 70–99)
Glucose-Capillary: 187 mg/dL — ABNORMAL HIGH (ref 70–99)

## 2021-08-20 LAB — PHOSPHORUS: Phosphorus: 3.1 mg/dL (ref 2.5–4.6)

## 2021-08-20 MED ORDER — METOLAZONE 2.5 MG PO TABS
2.5000 mg | ORAL_TABLET | Freq: Once | ORAL | Status: AC
  Administered 2021-08-20: 2.5 mg via ORAL
  Filled 2021-08-20: qty 1

## 2021-08-20 MED ORDER — ACETAMINOPHEN 325 MG PO TABS
650.0000 mg | ORAL_TABLET | ORAL | Status: DC | PRN
  Administered 2021-08-26: 650 mg via ORAL
  Filled 2021-08-20: qty 2

## 2021-08-20 MED ORDER — POTASSIUM CHLORIDE 10 MEQ/50ML IV SOLN
10.0000 meq | INTRAVENOUS | Status: AC
  Administered 2021-08-20 (×5): 10 meq via INTRAVENOUS
  Filled 2021-08-20 (×5): qty 50

## 2021-08-20 MED ORDER — POTASSIUM CHLORIDE CRYS ER 20 MEQ PO TBCR
40.0000 meq | EXTENDED_RELEASE_TABLET | Freq: Three times a day (TID) | ORAL | Status: DC
  Administered 2021-08-20 – 2021-08-28 (×24): 40 meq via ORAL
  Filled 2021-08-20 (×4): qty 4
  Filled 2021-08-20 (×2): qty 2
  Filled 2021-08-20 (×7): qty 4
  Filled 2021-08-20 (×2): qty 2
  Filled 2021-08-20 (×9): qty 4

## 2021-08-20 NOTE — Progress Notes (Signed)
[facility]  ROUNDING NOTE   Subjective:   [name] was admitted to [facility] on 07/26/2021 for Morbid obesity ([place]) [E66.01] DOE (dyspnea on exertion) [R06.09] Recurrent left pleural effusion [J90] Hypotension [I95.9] Type 2 diabetes mellitus without complication, without long-term current use of insulin ([place]) [E11.9] Atrial fibrillation, unspecified type ([place]) [I48.91] Chronic congestive heart failure, unspecified heart failure type ([place]) [I50.9]  Patient resting in recliner Appears tired, states she was unable to rest over night Poor appetite Creatinine stable Weight increased by 1.5kg, but edema improved   Objective:  Vital signs in last 24 hours:  Temp:  [97.8 F (36.6 C)-98.3 F (36.8 C)] 97.8 F (36.6 C) (10/31 0900) Pulse Rate:  [70-94] 75 (10/31 0900) Resp:  [16-19] 18 (10/31 0900) BP: (93-103)/(53-63) 98/63 (10/31 0900) SpO2:  [94 %-100 %] 100 % (10/31 0900) Weight:  [94.5 kg] 94.5 kg (10/31 0700)  Weight change: 1.384 kg Filed Weights   08/18/21 0522 08/19/21 0100 08/20/21 0700  Weight: 91.2 kg 93.1 kg 94.5 kg    Intake/Output: I/O last 3 completed shifts: In: 456 [P.O.:240; I.V.:216] Out: 1650 [Urine:1650]   Intake/Output this shift:  No intake/output data recorded.  Physical Exam: General: In no acute distress, seated in recliner  Head: oral mucosal membranes moist  Lungs:  Clear bilaterally, normal effort, dry cough  Heart: S1S2, irregular  Abdomen:  Non tender  Extremities:  ++ peripheral edema. Anasarca +  Neurologic: Awake, alert, able to answer questions appropriately  Skin: No acute lesions or rashes        Basic Metabolic Panel: Recent Labs  Lab 08/16/21 0601 08/17/21 1415 08/18/21 0637 08/19/21 0635 08/20/21 0650  NA 134* 135 137 139 138  K 3.4* 2.8* 3.1* 2.9* 3.3*  CL 95* 90* 93* 93* 93*  CO2 28 31 34* 36* 35*  GLUCOSE 206* 206* 161* 155* 174*  BUN 40* 40* 44* 41* 45*  CREATININE 2.01* 2.06* 1.85* 1.94*  1.76*  CALCIUM 9.1 9.5 9.4 9.2 9.3  MG 2.0 1.8 2.7* 2.3 2.2  PHOS 3.3 2.9 3.4 3.0 3.1     Liver Function Tests: Recent Labs  Lab 08/16/21 0601 08/17/21 1415 08/18/21 0637 08/19/21 0635 08/20/21 0650  AST 28 34 26 22 19   ALT 18 21 19 16 16   ALKPHOS 70 77 74 71 71  BILITOT 2.0* 2.4* 2.2* 2.5* 2.3*  PROT 5.9* 6.3* 6.1* 6.2* 6.1*  ALBUMIN 3.8 3.8 3.8 3.8 3.8    No results for input(s): LIPASE, AMYLASE in the last 168 hours. No results for input(s): AMMONIA in the last 168 hours.  CBC: Recent Labs  Lab 08/16/21 0601 08/17/21 1415 08/18/21 0637 08/19/21 0635 08/20/21 0650  WBC 6.7 6.2 5.6 6.2 5.7  HGB 10.0* 10.1* 9.7* 9.6* 9.6*  HCT 32.3* 31.1* 30.6* 29.6* 30.1*  MCV 90.7 88.1 89.0 88.9 89.1  PLT 298 369 341 351 356     Cardiac Enzymes: No results for input(s): CKTOTAL, CKMB, CKMBINDEX, TROPONINI in the last 168 hours.  BNP: Invalid input(s): POCBNP  CBG: Recent Labs  Lab 08/19/21 0812 08/19/21 1122 08/19/21 1654 08/19/21 2131 08/20/21 0803  GLUCAP 164* 220* 195* 171* 158*     Microbiology: Results for orders placed or performed during the hospital encounter of 07/26/21  Resp Panel by RT-PCR (Flu A&B, Covid) Nasopharyngeal Swab     Status: None   Collection Time: 07/26/21  7:19 PM   Specimen: Nasopharyngeal Swab; Nasopharyngeal(NP) swabs in vial transport medium  Result Value Ref Range  Status   SARS Coronavirus 2 by RT PCR NEGATIVE NEGATIVE Final    Comment: (NOTE) SARS-CoV-2 target nucleic acids are NOT DETECTED.  The SARS-CoV-2 RNA is generally detectable in upper respiratory specimens during the acute phase of infection. The lowest concentration of SARS-CoV-2 viral copies this assay can detect is 138 copies/mL. A negative result does not preclude SARS-Cov-2 infection and should not be used as the sole basis for treatment or other patient management decisions. A negative result may occur with  improper specimen collection/handling, submission of  specimen other than nasopharyngeal swab, presence of viral mutation(s) within the areas targeted by this assay, and inadequate number of viral copies(<138 copies/mL). A negative result must be combined with clinical observations, patient history, and epidemiological information. The expected result is Negative.  Fact Sheet for Patients:  EntrepreneurPulse.com.au  Fact Sheet for Healthcare Providers:  IncredibleEmployment.be  This test is no t yet approved or cleared by the [place] FDA and  has been authorized for detection and/or diagnosis of SARS-CoV-2 by FDA under an Emergency Use Authorization (EUA). This EUA will remain  in effect (meaning this test can be used) for the duration of the COVID-19 declaration under Section 564(b)(1) of the Act, 21 U.S.C.section 360bbb-3(b)(1), unless the authorization is terminated  or revoked sooner.       Influenza A by PCR NEGATIVE NEGATIVE Final   Influenza B by PCR NEGATIVE NEGATIVE Final    Comment: (NOTE) The Xpert Xpress SARS-CoV-2/FLU/RSV plus assay is intended as an aid in the diagnosis of influenza from Nasopharyngeal swab specimens and should not be used as a sole basis for treatment. Nasal washings and aspirates are unacceptable for Xpert Xpress SARS-CoV-2/FLU/RSV testing.  Fact Sheet for Patients: EntrepreneurPulse.com.au  Fact Sheet for Healthcare Providers: IncredibleEmployment.be  This test is not yet approved or cleared by the [place] FDA and has been authorized for detection and/or diagnosis of SARS-CoV-2 by FDA under an Emergency Use Authorization (EUA). This EUA will remain in effect (meaning this test can be used) for the duration of the COVID-19 declaration under Section 564(b)(1) of the Act, 21 U.S.C. section 360bbb-3(b)(1), unless the authorization is terminated or revoked.  Performed at [facility], [address] [place], [place] [address]   Body fluid culture w Gram Stain     Status: None   Collection Time: 07/28/21  1:53 PM   Specimen: PATH Cytology Pleural fluid  Result Value Ref Range Status   Specimen Description   Final    PLEURAL Performed at [facility], [address], [place], [place] [address]    Special Requests   Final    NONE Performed at [facility], [address] [place], [place] [address]    Gram Stain   Final    NO SQUAMOUS EPITHELIAL CELLS SEEN FEW WBC SEEN NO ORGANISMS SEEN    Culture   Final    NO GROWTH 3 DAYS Performed at [facility], [place] [address] [place], [place] [address]    Report Status 08/01/2021 FINAL  Final  Body fluid culture w Gram Stain     Status: None   Collection Time: 08/01/21  3:36 PM   Specimen: PATH Cytology Peritoneal fluid  Result Value Ref Range Status   Specimen Description   Final    PERITONEAL Performed at [facility], [address], [place], [place] [address]    Special Requests   Final    NONE Performed at [facility], [phone] [place], [place] [address]    Gram Stain   Final    RARE WBC PRESENT,BOTH PMN AND MONONUCLEAR NO ORGANISMS SEEN    Culture   Final    NO GROWTH 3 DAYS Performed at [facility], [place] [address] [place], [place] [address]    Report Status 08/05/2021 FINAL  Final  MRSA Next Gen by PCR, Nasal     Status: None   Collection Time: 08/04/21  8:56 PM   Specimen: Nasal Mucosa; Nasal Swab  Result Value Ref Range Status   MRSA by PCR Next Gen NOT DETECTED NOT DETECTED Final    Comment: (NOTE) The GeneXpert MRSA Assay (FDA approved for NASAL specimens only), is one component of a comprehensive MRSA colonization surveillance program. It is not intended to diagnose MRSA infection nor to guide or monitor treatment for MRSA infections. Test performance is not FDA approved in patients less than 61 years old. Performed at [facility],  [address] [place], [place] [address]   Body fluid culture w Gram Stain     Status: None   Collection Time: 08/05/21  5:50 PM   Specimen: Pericardial  Result Value Ref Range Status   Specimen Description   Final    PERICARDIAL Performed at [facility], [address], [place], [place] [address]    Special Requests   Final    NONE Performed at [facility], [address] [place], [place] [address]    Gram Stain   Final    FEW WBC PRESENT, PREDOMINANTLY MONONUCLEAR NO ORGANISMS SEEN    Culture   Final    NO GROWTH 3 DAYS Performed at [facility], [place] [address] [place], [place] [address]    Report Status 08/09/2021 FINAL  Final  Acid Fast Smear (AFB)     Status: None   Collection Time: 08/16/21 11:46 AM   Specimen: PATH Cytology Pleural fluid  Result Value Ref Range Status   AFB Specimen Processing Concentration  Final   Acid Fast Smear Negative  Final    Comment: (NOTE) Performed At: [facility] [place], [place] [id number] Rush Farmer MD [id number]    Source (AFB) PLEURAL  Final    Comment: Performed at [facility], [address] [place], [place] [address]  Body fluid culture w Gram Stain     Status: None   Collection Time: 08/16/21 11:46 AM   Specimen: PATH Cytology Pleural fluid  Result Value Ref Range Status   Specimen Description   Final    PLEURAL Performed at [facility], [address], [place], [place] [address]    Special Requests   Final    NONE Performed at [facility], [address] [place], [place] [address]    Gram Stain   Final    WBC PRESENT, PREDOMINANTLY MONONUCLEAR NO ORGANISMS SEEN CYTOSPIN SMEAR    Culture   Final    NO GROWTH 3 DAYS Performed at [facility], [place] [address] [place], [place] [address]    Report Status 08/19/2021 FINAL  Final    Coagulation Studies: No results for input(s): LABPROT, INR in the last 72  hours.   Urinalysis: No results for input(s): COLORURINE, LABSPEC, PHURINE, GLUCOSEU, HGBUR, BILIRUBINUR, KETONESUR, PROTEINUR, UROBILINOGEN, NITRITE, LEUKOCYTESUR in the last 72 hours.  Invalid input(s): APPERANCEUR    Imaging: ECHOCARDIOGRAM LIMITED  Result Date: 08/19/2021    ECHOCARDIOGRAM LIMITED REPORT   Patient  Name:   Karen Farley Date of Exam: 08/19/2021 Medical Rec #:  [id number]          Height:       62.0 in Accession #:    [id number]         Weight:       205.2 lb Date of Birth:  02-03-1946           BSA:          1.933 m Patient Age:    13 years           BP:           92/51 mmHg Patient Gender: F                  HR:           79 bpm. Exam Location:  [facility] Procedure: Limited Echo Indications:     Pericardial Effusion  History:         Patient has prior history of Echocardiogram examinations.                  Arrythmias:Atrial Fibrillation; Risk Factors:Diabetes,                  Hypertension and CKD IIIb, Morbid Obesity.  Sonographer:     L Thornton-Maynard Referring Phys:  [id number] [place] Diagnosing Phys: Skeet Latch MD IMPRESSIONS  1. Left ventricular ejection fraction, by estimation, is 60 to 65%. The left ventricle has normal function. The left ventricle has no regional wall motion abnormalities. Left ventricular diastolic function could not be evaluated.  2. Right ventricular systolic function is normal. The right ventricular size is normal.  3. Large pericardial effusion slightly smaller than on the echo 08/15/21. There is echogenic material in the pericardium consistent with thrombus. No evidence of tamponade. Large pericardial effusion. Moderate pleural effusion in the left lateral region.  4. The inferior vena cava is normal in size with greater than 50% respiratory variability, suggesting right atrial pressure of 3 mmHg. FINDINGS  Left Ventricle: Left ventricular ejection fraction, by estimation, is 60 to 65%. The left ventricle has normal function. The left ventricle  has no regional wall motion abnormalities. The left ventricular internal cavity size was normal in size. There is  no left ventricular hypertrophy. Left ventricular diastolic function could not be evaluated. Right Ventricle: The right ventricular size is normal. No increase in right ventricular wall thickness. Right ventricular systolic function is normal. Pericardium: Large pericardial effusion slightly smaller than on the echo 08/15/21. There is echogenic material in the pericardium consistent with thrombus. No evidence of tamponade. A large pericardial effusion is present. There is excessive respiratory  variation in the tricuspid valve spectral Doppler velocities. Venous: The inferior vena cava is normal in size with greater than 50% respiratory variability, suggesting right atrial pressure of 3 mmHg. Additional Comments: There is a moderate pleural effusion in the left lateral region. LEFT VENTRICLE PLAX 2D LVIDd:         3.30 cm LVIDs:         1.80 cm LV PW:         1.10 cm LV IVS:        1.00 cm  Skeet Latch MD Electronically signed by Skeet Latch MD Signature Date/Time: 08/19/2021/2:14:10 PM    Final      Medications:    sodium chloride Stopped (08/18/21 0325)   furosemide (LASIX) 200 mg in dextrose 5% 100 mL (2mg /mL) infusion  8 mg/hr (08/17/21 1700)   potassium chloride      apixaban  5 mg Oral BID   Chlorhexidine Gluconate Cloth  6 each Topical Daily   docusate sodium  100 mg Oral Daily   folic acid  1 mg Oral Daily   hydrocortisone   Rectal TID   insulin aspart  0-5 Units Subcutaneous QHS   insulin aspart  0-9 Units Subcutaneous TID WC   iron polysaccharides  150 mg Oral Daily   latanoprost  1 drop Both Eyes QHS   metoprolol tartrate  12.5 mg Oral Q6H   midodrine  10 mg Oral TID WC   pantoprazole (PROTONIX) IV  40 mg Intravenous Q12H   polyethylene glycol  17 g Oral Daily   potassium chloride  40 mEq Oral BID   Ensure Max Protein  11 oz Oral BID   spironolactone  25 mg  Oral Daily   vitamin B-12  500 mcg Oral Daily     Assessment/ Plan:  Karen Farley is a 75 y.o. white female with diastolic congestive heart failure, hypertension, hyperlipidemia, lymphedema, atrial fibrillation, peripheral vascular disease who is admitted to [facility] on 07/26/2021 for Morbid obesity ([place]) [E66.01] DOE (dyspnea on exertion) [R06.09] Recurrent left pleural effusion [J90] Hypotension [I95.9] Type 2 diabetes mellitus without complication, without long-term current use of insulin ([place]) [E11.9] Atrial fibrillation, unspecified type ([place]) [I48.91] Chronic congestive heart failure, unspecified heart failure type ([place]) [I50.9]  #Acute kidney injury on chronic kidney disease stage IIIB: with baseline creatinine of 1.66, GFR of 36 on 07/13/21. History of bland urine. 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 Hold benazepril. Creatinine stable Continue Furosemide infusion 8 mg/hr and spironolactone   #Hypotension Blood Pressure 98/63 this morning Will continue monitoring closely Midodrine 10 mg 3 times a day  #Acute exacerbation of diastolic congestive heart failure:  ECHO performed on 08/19/21 shows large pericardial effusion Cardiology team following and monitoring No acute respiratory distress        LOS: 25 Karen Farley 10/31/20229:40 AM

## 2021-08-20 NOTE — Progress Notes (Signed)
PT Cancellation Note  Patient Details Name: Karen Farley MRN: [id number] DOB: [birth year]   Cancelled Treatment:     Pt refused PT, also has placed a note on outside of door not to be disturbed.    [name] 08/20/2021, 11:37 AM

## 2021-08-20 NOTE — Progress Notes (Signed)
Progress Note  Patient Name: Karen Farley Date of Encounter: 08/20/2021  [facility] Cardiologist: Kate Sable, MD   Subjective   Breathing about the same.  Swelling has improved over the last few days, especially in the arms.  No chest pain or palpitations.  Inpatient Medications    Scheduled Meds:  apixaban  5 mg Oral BID   Chlorhexidine Gluconate Cloth  6 each Topical Daily   docusate sodium  100 mg Oral Daily   folic acid  1 mg Oral Daily   hydrocortisone   Rectal TID   insulin aspart  0-5 Units Subcutaneous QHS   insulin aspart  0-9 Units Subcutaneous TID WC   iron polysaccharides  150 mg Oral Daily   latanoprost  1 drop Both Eyes QHS   metoprolol tartrate  12.5 mg Oral Q6H   midodrine  10 mg Oral TID WC   pantoprazole (PROTONIX) IV  40 mg Intravenous Q12H   polyethylene glycol  17 g Oral Daily   potassium chloride  40 mEq Oral TID   Ensure Max Protein  11 oz Oral BID   spironolactone  25 mg Oral Daily   vitamin B-12  500 mcg Oral Daily   Continuous Infusions:  sodium chloride Stopped (08/18/21 0325)   furosemide (LASIX) 200 mg in dextrose 5% 100 mL (2mg /mL) infusion 8 mg/hr (08/17/21 1700)   potassium chloride     PRN Meds: acetaminophen, bisacodyl, lidocaine HCl (PF), lip balm, traMADol   Vital Signs    Vitals:   08/19/21 2253 08/20/21 0637 08/20/21 0700 08/20/21 0900  BP: (!) 93/59 (!) 98/53  98/63  Pulse: 70 79  75  Resp:    18  Temp: 98 F (36.7 C) 97.8 F (36.6 C)  97.8 F (36.6 C)  TempSrc:  Oral    SpO2: 98% 95%  100%  Weight:   94.5 kg   Height:        Intake/Output Summary (Last 24 hours) at 08/20/2021 1252 Last data filed at 08/20/2021 1112 Gross per 24 hour  Intake 456 ml  Output 500 ml  Net -44 ml   Last 3 Weights 08/20/2021 08/19/2021 08/18/2021  Weight (lbs) 208 lb 4.8 oz 205 lb 4 oz 201 lb 1.6 oz  Weight (kg) 94.484 kg 93.1 kg 91.218 kg      Telemetry    Atrial fibrillation with ventricular rates of 70-100  bpm- Personally Reviewed  ECG   No new tracing  Physical Exam   GEN: No acute distress.   Neck: Unable to assess JVP due to body habitus. Cardiac: Distant heart sounds.  Irregularly irregular with 1/6 systolic murmur. Respiratory: Mildly diminished breath sounds at the lung bases, left greater than right. GI: Soft, nontender, non-distended  MS:-2+ pitting edema in both calves above areas of Ace wrapping; No deformity. Neuro:  Nonfocal  Psych: Normal affect   Labs    High Sensitivity Troponin:   Recent Labs  Lab 07/26/21 1708  TROPONINIHS 17     Chemistry Recent Labs  Lab 08/18/21 0637 08/19/21 0635 08/20/21 0650  NA 137 139 138  K 3.1* 2.9* 3.3*  CL 93* 93* 93*  CO2 34* 36* 35*  GLUCOSE 161* 155* 174*  BUN 44* 41* 45*  CREATININE 1.85* 1.94* 1.76*  CALCIUM 9.4 9.2 9.3  MG 2.7* 2.3 2.2  PROT 6.1* 6.2* 6.1*  ALBUMIN 3.8 3.8 3.8  AST 26 22 19   ALT 19 16 16   ALKPHOS 74 71 71  BILITOT 2.2*  2.5* 2.3*  GFRNONAA 28* 27* 30*  ANIONGAP 10 10 10     Lipids No results for input(s): CHOL, TRIG, HDL, LABVLDL, LDLCALC, CHOLHDL in the last 168 hours.  Hematology Recent Labs  Lab 08/18/21 1610 08/19/21 0635 08/20/21 0650  WBC 5.6 6.2 5.7  RBC 3.44* 3.33* 3.38*  HGB 9.7* 9.6* 9.6*  HCT 30.6* 29.6* 30.1*  MCV 89.0 88.9 89.1  MCH 28.2 28.8 28.4  MCHC 31.7 32.4 31.9  RDW 26.5* 26.3* 25.7*  PLT 341 351 356   Thyroid No results for input(s): TSH, FREET4 in the last 168 hours.  BNPNo results for input(s): BNP, PROBNP in the last 168 hours.  DDimer No results for input(s): DDIMER in the last 168 hours.   Radiology    ECHOCARDIOGRAM LIMITED  Result Date: 08/19/2021    ECHOCARDIOGRAM LIMITED REPORT   Patient Name:   Karen Farley Date of Exam: 08/19/2021 Medical Rec #:  [id number]          Height:       62.0 in Accession #:    [id number]         Weight:       205.2 lb Date of Birth:  [birth year]           BSA:          1.933 m Patient Age:    75 years           BP:            92/51 mmHg Patient Gender: F                  HR:           79 bpm. Exam Location:  [facility] Procedure: Limited Echo Indications:     Pericardial Effusion  History:         Patient has prior history of Echocardiogram examinations.                  Arrythmias:Atrial Fibrillation; Risk Factors:Diabetes,                  Hypertension and CKD IIIb, Morbid Obesity.  Sonographer:     L Thornton-Maynard Referring Phys:  [id number] [place] Diagnosing Phys: Skeet Latch MD IMPRESSIONS  1. Left ventricular ejection fraction, by estimation, is 60 to 65%. The left ventricle has normal function. The left ventricle has no regional wall motion abnormalities. Left ventricular diastolic function could not be evaluated.  2. Right ventricular systolic function is normal. The right ventricular size is normal.  3. Large pericardial effusion slightly smaller than on the echo 08/15/21. There is echogenic material in the pericardium consistent with thrombus. No evidence of tamponade. Large pericardial effusion. Moderate pleural effusion in the left lateral region.  4. The inferior vena cava is normal in size with greater than 50% respiratory variability, suggesting right atrial pressure of 3 mmHg. FINDINGS  Left Ventricle: Left ventricular ejection fraction, by estimation, is 60 to 65%. The left ventricle has normal function. The left ventricle has no regional wall motion abnormalities. The left ventricular internal cavity size was normal in size. There is  no left ventricular hypertrophy. Left ventricular diastolic function could not be evaluated. Right Ventricle: The right ventricular size is normal. No increase in right ventricular wall thickness. Right ventricular systolic function is normal. Pericardium: Large pericardial effusion slightly smaller than on the echo 08/15/21. There is echogenic material in the pericardium consistent with thrombus. No evidence of tamponade. A  large pericardial effusion is present. There is  excessive respiratory  variation in the tricuspid valve spectral Doppler velocities. Venous: The inferior vena cava is normal in size with greater than 50% respiratory variability, suggesting right atrial pressure of 3 mmHg. Additional Comments: There is a moderate pleural effusion in the left lateral region. LEFT VENTRICLE PLAX 2D LVIDd:         3.30 cm LVIDs:         1.80 cm LV PW:         1.10 cm LV IVS:        1.00 cm  Skeet Latch MD Electronically signed by Skeet Latch MD Signature Date/Time: 08/19/2021/2:14:10 PM    Final     Cardiac Studies   See limited echo above  Patient Profile     75 y.o. female with history of chronic diastolic heart failure, permanent atrial fibrillation, CKD 3, hypertension, hyperlipidemia, and obesity admitted with GI bleed and anasarca.  Assessment & Plan    Chronic HFpEF and pericardial effusion: Volume status has improved over the last week, though for the last few days dyspnea and edema have been fairly stable.  Ms. Lattner notes good urine output on furosemide infusion though weights have actually been trending up (up 7.2 pounds since nadir 2 days ago).  Renal function is improving and close to baseline.  Limited echo yesterday shows stable moderate-large, predominantly posterior pericardial effusion. -Recommend continuation of furosemide infusion at current rate.  Would favor giving a one-time dose of metolazone 2.5 mg to augment diuresis as we seem to be losing ground in regard to fluid retention.  I will reach out to nephrology and IM to ensure that they are in agreement with this. -Continue spironolactone. -Continue low-dose metoprolol. -If unable to remove more fluid without significant bump in renal function, will need to consider right +/- left catheterization to better understand hemodynamics and potentially further assess aortic stenosis. -Wean midodrine as blood pressure tolerates. -No indication for drainage of pericardial effusion at this  time.  Given recurrent nature and posterior location, pericardial window rather than pericardiocentesis would need to be pursued if effusion grows.  Permanent atrial fibrillation: Ventricular rates adequately controlled. -Continue low-dose metoprolol and apixaban.  Hypotension: Blood pressure low normal. -Continue current doses of metoprolol and diuretics.  GI bleed: Hemoglobin stable. -Continue apixaban.    For questions or updates, please contact [place] Please consult [email or web address] for contact info under [facility].     Signed, Nelva Bush, MD  08/20/2021, 12:52 PM

## 2021-08-20 NOTE — Progress Notes (Signed)
Dressing change to bilateral LE as ordered

## 2021-08-20 NOTE — Progress Notes (Signed)
PROGRESS NOTE    Karen Farley  CHY:[id number] DOB: 1946-09-28 DOA: 07/26/2021 PCP: Valerie Roys, DO   Brief Narrative:  75 y.o. WF PMHx HFpEF, permanent atrial fibrillation on Eliquis, PAD, type 2 diabetes, CKD stage IIIb, lymphedema   Presented to hospital with increasing shortness of breath and edema.  Patient has been noticing increasing abdominal distention since her admission in August with severe lower extremity edema and dyspnea and exertion.   Subjective: 10/31 A/O x4,    Assessment & Plan:  Covid vaccination; vaccinated 3/3  Principal Problem:   Acute blood loss anemia Active Problems:   Type 2 diabetes mellitus with renal complication (HCC)   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 diabetes mellitus (HCC)   Atrial fibrillation (HCC)   Acute heart failure with preserved ejection fraction (HFpEF) (HCC)   Pleural effusion   Acute on chronic diastolic CHF (congestive heart failure) (HCC)   Chronic anticoagulation   Shock circulatory (HCC)   Pericardial effusion   Acute respiratory failure with hypoxia (HCC)  GI bleed with hemorrhagic shock. --Patient was noted to have significant drop in hemoglobin to 9.7 from 12.4 and was transferred to the ICU.   --GI Dr Alice Reichert from 10/19-- May forego endoluminal evaluation for the near future given lack of bleeding recurrence and persistence of cardiopulmonary issues. Patient is in agreement. --10/23-- d/w Dr Russo--no Rectal bleed. Ok to resume po eliquis   Large pericardial effusion  -S/p percardiocentesis  Large Pleural effusion s/p thoracentesis Patient underwent a pericardiocentesis on 08/05/2021 with removal of 700 mL of pleural fluid.  Inflammatory markers were unremarkable.  Cardiology monitoring the patient while in the hospital.  Still on vasopressors due to hypotension. --repeat echo 10/21--no pericaridal effusion  Acute on chronic diastolic CHF -Strict in and out -16.8 L (10/31) - Daily weight Filed Weights   08/18/21  0522 08/19/21 0100 08/20/21 0700  Weight: 91.2 kg 93.1 kg 94.5 kg    Severe Anasarca -status post thoracocentesis with removal of 600 mL of fluid on 07/28/2021. --10/20-- torsemide 20 mg qd with IV albumin --10/21-- IV lqasix 40 mg bid per cardiology --10/22 cont IV lasix--good uop --10/23--IV lasix --10/24-- Dr Juleen China wants to start IV lasxi gtt --10/25-cont Lasix gtt     Hypotension suspect  -Secondary to hemorrhagic shock and low intravsacular volume --cont po  midodrine.   --IV Albumin       Acute kidney injury on CKD stage 3b(Baseline Cr 1.4-1.7) Lab Results  Component Value Date   CREATININE 1.76 (H) 08/20/2021   CREATININE 1.94 (H) 08/19/2021   CREATININE 1.85 (H) 08/18/2021   CREATININE 2.06 (H) 08/17/2021   CREATININE 2.01 (H) 08/16/2021  -Currently at baseline.   --Nephrology followed the patient during hospitalization.  -- Benazepril and spironolactone on hold.   Permanent atrial fibrillation --Was previously on Eliquis.  Currently on hold due to GI bleed.  -- Rate controlled at this time--not on any po rate controlling meds --10/21--started on IV heparin gtt--monitor hgb  --10/22-- hgb remains stable --10/23-- discussed with G.I. Dr. Virgina Jock and cardiology Dr. Rockey Situ plan is to move forward with starting eliquis while patient is in-house to make sure she tolerates. DC heparin drip. Patient is in agreement with plan. Lab Results  Component Value Date   HGB 9.6 (L) 08/20/2021   HGB 9.6 (L) 08/19/2021   HGB 9.7 (L) 08/18/2021   HGB 10.1 (L) 08/17/2021   HGB 10.0 (L) 08/16/2021   Pulmonary HTN - See A. fib  DM  type II with hyperglycemia Diet controlled at home.   -- Sensitive SSI  Iron deficiency/Vitamin B12  --patient received Venofer -cont oral iron and vitamin B12 supplements.  Hypokalemia -Potassium goal> 4 - 10/31 Potassium IV 50 mEq - 10/31  K-Dur 40 mEq TID  Hypomagnesmia - Magnesium goal> 2 - 10/28 Magnesium IV 3 g   Morbidly Obese  Body  mass index is 38.1 kg/m.     DVT prophylaxis: Eliquis  Code Status: Full Family Communication: 10/30 husband at bedside for discussion plan of care all questions answered Status is: Inpatient    Dispo: The patient is from: Home              Anticipated d/c is to: Home              Anticipated d/c date is: > 3 days              Patient currently is not medically stable to d/c.      Consultants:  Nephrology IR    Procedures/Significant Events:  10/8 s/p left thoracentesis: 62ml aspirated 10/12 paracentesis 1.2L aspirated .  10/16 Echocardiogram performed: Pericardiocentesis 752ml aspirated and drain placed  Failed Lasix IV advanced to Lasix gtt; however, Lasix and Eliquis stopped 10/15 and patient transferred to ICU after had episode of GI bleed with drop in hgb, s/p 2U pRBC and vasopressor support initiated.    10/21 Echocardiogram LVEF= 60 to 65%.  -Pulmonary HTN: Assumed right atrial pressure of 15 mmHg, the  estimated right ventricular systolic pressure is 43.3 mmHg.  Pericardium: A small pericardial effusion is present.  10/27 LEFT thoracentesis: Aspirated 700 mL of clear yellow fluid was removed      I have personally reviewed and interpreted all radiology studies and my findings are as above.  VENTILATOR SETTINGS:    Cultures   Antimicrobials: Anti-infectives (From admission, onward)    None         Devices    LINES / TUBES:      Continuous Infusions:  sodium chloride Stopped (08/18/21 0325)   furosemide (LASIX) 200 mg in dextrose 5% 100 mL (2mg /mL) infusion 8 mg/hr (08/17/21 1700)   potassium chloride       Objective: Vitals:   08/19/21 2253 08/20/21 0637 08/20/21 0700 08/20/21 0900  BP: (!) 93/59 (!) 98/53  98/63  Pulse: 70 79  75  Resp:    18  Temp: 98 F (36.7 C) 97.8 F (36.6 C)  97.8 F (36.6 C)  TempSrc:  Oral    SpO2: 98% 95%  100%  Weight:   94.5 kg   Height:        Intake/Output Summary (Last 24 hours) at  08/20/2021 1241 Last data filed at 08/20/2021 1112 Gross per 24 hour  Intake 456 ml  Output 500 ml  Net -44 ml    Filed Weights   08/18/21 0522 08/19/21 0100 08/20/21 0700  Weight: 91.2 kg 93.1 kg 94.5 kg    Examination:  General: A/O x4 No acute respiratory distress Eyes: negative scleral hemorrhage, negative anisocoria, negative icterus ENT: Negative Runny nose, negative gingival bleeding, Neck:  Negative scars, masses, torticollis, lymphadenopathy, JVD Lungs: Clear to auscultation bilaterally without wheezes or crackles Cardiovascular: Regular rate and rhythm without murmur gallop or rub normal S1 and S2 Abdomen: Positive MORBID OBESITY, positive ANASARCA abdominal pain, nondistended, positive soft, bowel sounds, no rebound, no ascites, no appreciable mass Extremities: POSITIVE bilateral lower extremity edema (ANASARCA) Skin: Negative rashes, lesions, ulcers Psychiatric:  Negative depression, negative anxiety, negative fatigue, negative mania  Central nervous system:  Cranial nerves II through XII intact, tongue/uvula midline, all extremities muscle strength 5/5, sensation intact throughout, negative dysarthria, negative expressive aphasia, negative receptive aphasia.  .     Data Reviewed: Care during the described time interval was provided by me .  I have reviewed this patient's available data, including medical history, events of note, physical examination, and all test results as part of my evaluation.   CBC: Recent Labs  Lab 08/16/21 0601 08/17/21 1415 08/18/21 0637 08/19/21 0635 08/20/21 0650  WBC 6.7 6.2 5.6 6.2 5.7  HGB 10.0* 10.1* 9.7* 9.6* 9.6*  HCT 32.3* 31.1* 30.6* 29.6* 30.1*  MCV 90.7 88.1 89.0 88.9 89.1  PLT 298 369 341 351 725    Basic Metabolic Panel: Recent Labs  Lab 08/16/21 0601 08/17/21 1415 08/18/21 0637 08/19/21 0635 08/20/21 0650  NA 134* 135 137 139 138  K 3.4* 2.8* 3.1* 2.9* 3.3*  CL 95* 90* 93* 93* 93*  CO2 28 31 34* 36* 35*   GLUCOSE 206* 206* 161* 155* 174*  BUN 40* 40* 44* 41* 45*  CREATININE 2.01* 2.06* 1.85* 1.94* 1.76*  CALCIUM 9.1 9.5 9.4 9.2 9.3  MG 2.0 1.8 2.7* 2.3 2.2  PHOS 3.3 2.9 3.4 3.0 3.1    GFR: Estimated Creatinine Clearance: 29.6 mL/min (A) (by C-G formula based on SCr of 1.76 mg/dL (H)). Liver Function Tests: Recent Labs  Lab 08/16/21 0601 08/17/21 1415 08/18/21 0637 08/19/21 0635 08/20/21 0650  AST 28 34 26 22 19   ALT 18 21 19 16 16   ALKPHOS 70 77 74 71 71  BILITOT 2.0* 2.4* 2.2* 2.5* 2.3*  PROT 5.9* 6.3* 6.1* 6.2* 6.1*  ALBUMIN 3.8 3.8 3.8 3.8 3.8    No results for input(s): LIPASE, AMYLASE in the last 168 hours. No results for input(s): AMMONIA in the last 168 hours. Coagulation Profile: No results for input(s): INR, PROTIME in the last 168 hours.  Cardiac Enzymes: No results for input(s): CKTOTAL, CKMB, CKMBINDEX, TROPONINI in the last 168 hours. BNP (last 3 results) No results for input(s): PROBNP in the last 8760 hours. HbA1C: No results for input(s): HGBA1C in the last 72 hours. CBG: Recent Labs  Lab 08/19/21 1122 08/19/21 1654 08/19/21 2131 08/20/21 0803 08/20/21 1225  GLUCAP 220* 195* 171* 158* 246*    Lipid Profile: No results for input(s): CHOL, HDL, LDLCALC, TRIG, CHOLHDL, LDLDIRECT in the last 72 hours. Thyroid Function Tests: No results for input(s): TSH, T4TOTAL, FREET4, T3FREE, THYROIDAB in the last 72 hours. Anemia Panel: Recent Labs    08/17/21 2110  VITAMINB12 977*  FOLATE 28.0  FERRITIN 149  TIBC 346  IRON 35  RETICCTPCT 5.3*    Urine analysis:    Component Value Date/Time   COLORURINE YELLOW (A) 04/11/2021 1215   APPEARANCEUR HAZY (A) 04/11/2021 1215   APPEARANCEUR Clear 06/06/2020 1339   LABSPEC 1.011 04/11/2021 1215   PHURINE 5.0 04/11/2021 1215   GLUCOSEU NEGATIVE 04/11/2021 1215   HGBUR NEGATIVE 04/11/2021 1215   BILIRUBINUR NEGATIVE 04/11/2021 1215   BILIRUBINUR Negative 06/06/2020 1339   KETONESUR NEGATIVE 04/11/2021  1215   PROTEINUR NEGATIVE 04/11/2021 1215   NITRITE NEGATIVE 04/11/2021 1215   LEUKOCYTESUR MODERATE (A) 04/11/2021 1215   Sepsis Labs: @LABRCNTIP (procalcitonin:4,lacticidven:4)  ) Recent Results (from the past 240 hour(s))  Acid Fast Smear (AFB)     Status: None   Collection Time: 08/16/21 11:46 AM   Specimen: PATH Cytology Pleural fluid  Result Value Ref Range Status   AFB Specimen Processing Concentration  Final   Acid Fast Smear Negative  Final    Comment: (NOTE) Performed At: [facility] [place], [place] [id number] Rush Farmer MD LY:[id number]    Source (AFB) PLEURAL  Final    Comment: Performed at [facility], [address] [place], [place] [address]  Body fluid culture w Gram Stain     Status: None   Collection Time: 08/16/21 11:46 AM   Specimen: PATH Cytology Pleural fluid  Result Value Ref Range Status   Specimen Description   Final    PLEURAL Performed at [facility], [address], [place], [place] [address]    Special Requests   Final    NONE Performed at [facility], [address] [place], [place] [address]    Gram Stain   Final    WBC PRESENT, PREDOMINANTLY MONONUCLEAR NO ORGANISMS SEEN CYTOSPIN SMEAR    Culture   Final    NO GROWTH 3 DAYS Performed at [facility], [place] [address], [place], [place] [address]    Report Status 08/19/2021 FINAL  Final          Radiology Studies: ECHOCARDIOGRAM LIMITED  Result Date: 08/19/2021    ECHOCARDIOGRAM LIMITED REPORT   Patient Name:   Karen Farley Date of Exam: 08/19/2021 Medical Rec #:  [id number]          Height:       62.0 in Accession #:    [id number]         Weight:       205.2 lb Date of Birth:  August 17, 1946           BSA:          1.933 m Patient Age:    64 years           BP:           92/51 mmHg Patient Gender: F                  HR:           79 bpm. Exam Location:  [facility] Procedure: Limited Echo Indications:     Pericardial  Effusion  History:         Patient has prior history of Echocardiogram examinations.                  Arrythmias:Atrial Fibrillation; Risk Factors:Diabetes,                  Hypertension and CKD IIIb, Morbid Obesity.  Sonographer:     L Thornton-Maynard Referring Phys:  [id number] [place] Diagnosing Phys: Skeet Latch MD IMPRESSIONS  1. Left ventricular ejection fraction, by estimation, is 60 to 65%. The left ventricle has normal function. The left ventricle has no regional wall motion abnormalities. Left ventricular diastolic function could not be evaluated.  2. Right ventricular systolic function is normal. The right ventricular size is normal.  3. Large pericardial effusion slightly smaller than on the echo 08/15/21. There is echogenic material in the pericardium consistent with thrombus. No evidence of tamponade. Large pericardial effusion. Moderate pleural effusion in the left lateral region.  4. The inferior vena cava is normal in size with greater than 50% respiratory variability, suggesting right atrial pressure of 3 mmHg. FINDINGS  Left Ventricle: Left ventricular ejection fraction, by estimation, is 60 to 65%. The left ventricle has normal function. The  left ventricle has no regional wall motion abnormalities. The left ventricular internal cavity size was normal in size. There is  no left ventricular hypertrophy. Left ventricular diastolic function could not be evaluated. Right Ventricle: The right ventricular size is normal. No increase in right ventricular wall thickness. Right ventricular systolic function is normal. Pericardium: Large pericardial effusion slightly smaller than on the echo 08/15/21. There is echogenic material in the pericardium consistent with thrombus. No evidence of tamponade. A large pericardial effusion is present. There is excessive respiratory  variation in the tricuspid valve spectral Doppler velocities. Venous: The inferior vena cava is normal in size with greater than 50%  respiratory variability, suggesting right atrial pressure of 3 mmHg. Additional Comments: There is a moderate pleural effusion in the left lateral region. LEFT VENTRICLE PLAX 2D LVIDd:         3.30 cm LVIDs:         1.80 cm LV PW:         1.10 cm LV IVS:        1.00 cm  Skeet Latch MD Electronically signed by Skeet Latch MD Signature Date/Time: 08/19/2021/2:14:10 PM    Final         Scheduled Meds:  apixaban  5 mg Oral BID   Chlorhexidine Gluconate Cloth  6 each Topical Daily   docusate sodium  100 mg Oral Daily   folic acid  1 mg Oral Daily   hydrocortisone   Rectal TID   insulin aspart  0-5 Units Subcutaneous QHS   insulin aspart  0-9 Units Subcutaneous TID WC   iron polysaccharides  150 mg Oral Daily   latanoprost  1 drop Both Eyes QHS   metoprolol tartrate  12.5 mg Oral Q6H   midodrine  10 mg Oral TID WC   pantoprazole (PROTONIX) IV  40 mg Intravenous Q12H   polyethylene glycol  17 g Oral Daily   potassium chloride  40 mEq Oral BID   Ensure Max Protein  11 oz Oral BID   spironolactone  25 mg Oral Daily   vitamin B-12  500 mcg Oral Daily   Continuous Infusions:  sodium chloride Stopped (08/18/21 0325)   furosemide (LASIX) 200 mg in dextrose 5% 100 mL (2mg /mL) infusion 8 mg/hr (08/17/21 1700)   potassium chloride       LOS: 25 days   The patient is critically ill with multiple organ systems failure and requires high complexity decision making for assessment and support, frequent evaluation and titration of therapies, application of advanced monitoring technologies and extensive interpretation of multiple databases. Critical Care Time devoted to patient care services described in this note  Time spent: 40 minutes     Shaterria Sager, Geraldo Docker, MD [facility]   If 7PM-7AM, please contact night-coverage 08/20/2021, 12:41 PM

## 2021-08-21 DIAGNOSIS — N179 Acute kidney failure, unspecified: Secondary | ICD-10-CM | POA: Diagnosis not present

## 2021-08-21 DIAGNOSIS — N189 Chronic kidney disease, unspecified: Secondary | ICD-10-CM

## 2021-08-21 DIAGNOSIS — I5031 Acute diastolic (congestive) heart failure: Secondary | ICD-10-CM | POA: Diagnosis not present

## 2021-08-21 DIAGNOSIS — I3139 Other pericardial effusion (noninflammatory): Secondary | ICD-10-CM | POA: Diagnosis not present

## 2021-08-21 LAB — CBC WITH DIFFERENTIAL/PLATELET
Abs Immature Granulocytes: 0.02 10*3/uL (ref 0.00–0.07)
Basophils Absolute: 0.1 10*3/uL (ref 0.0–0.1)
Basophils Relative: 1 %
Eosinophils Absolute: 0.2 10*3/uL (ref 0.0–0.5)
Eosinophils Relative: 5 %
HCT: 32.8 % — ABNORMAL LOW (ref 36.0–46.0)
Hemoglobin: 9.9 g/dL — ABNORMAL LOW (ref 12.0–15.0)
Immature Granulocytes: 0 %
Lymphocytes Relative: 10 %
Lymphs Abs: 0.4 10*3/uL — ABNORMAL LOW (ref 0.7–4.0)
MCH: 27.1 pg (ref 26.0–34.0)
MCHC: 30.2 g/dL (ref 30.0–36.0)
MCV: 89.9 fL (ref 80.0–100.0)
Monocytes Absolute: 0.6 10*3/uL (ref 0.1–1.0)
Monocytes Relative: 13 %
Neutro Abs: 3.2 10*3/uL (ref 1.7–7.7)
Neutrophils Relative %: 71 %
Platelets: 380 10*3/uL (ref 150–400)
RBC: 3.65 MIL/uL — ABNORMAL LOW (ref 3.87–5.11)
RDW: 25.8 % — ABNORMAL HIGH (ref 11.5–15.5)
Smear Review: NORMAL
WBC: 4.5 10*3/uL (ref 4.0–10.5)
nRBC: 0 % (ref 0.0–0.2)

## 2021-08-21 LAB — COMPREHENSIVE METABOLIC PANEL
ALT: 14 U/L (ref 0–44)
AST: 20 U/L (ref 15–41)
Albumin: 3.6 g/dL (ref 3.5–5.0)
Alkaline Phosphatase: 77 U/L (ref 38–126)
Anion gap: 8 (ref 5–15)
BUN: 43 mg/dL — ABNORMAL HIGH (ref 8–23)
CO2: 34 mmol/L — ABNORMAL HIGH (ref 22–32)
Calcium: 9.4 mg/dL (ref 8.9–10.3)
Chloride: 96 mmol/L — ABNORMAL LOW (ref 98–111)
Creatinine, Ser: 1.67 mg/dL — ABNORMAL HIGH (ref 0.44–1.00)
GFR, Estimated: 32 mL/min — ABNORMAL LOW (ref >60)
Glucose, Bld: 178 mg/dL — ABNORMAL HIGH (ref 70–99)
Potassium: 3.8 mmol/L (ref 3.5–5.1)
Sodium: 138 mmol/L (ref 135–145)
Total Bilirubin: 2 mg/dL — ABNORMAL HIGH (ref 0.3–1.2)
Total Protein: 6.1 g/dL — ABNORMAL LOW (ref 6.5–8.1)

## 2021-08-21 LAB — MAGNESIUM: Magnesium: 2.1 mg/dL (ref 1.7–2.4)

## 2021-08-21 LAB — GLUCOSE, CAPILLARY
Glucose-Capillary: 165 mg/dL — ABNORMAL HIGH (ref 70–99)
Glucose-Capillary: 219 mg/dL — ABNORMAL HIGH (ref 70–99)
Glucose-Capillary: 205 mg/dL — ABNORMAL HIGH (ref 70–99)
Glucose-Capillary: 179 mg/dL — ABNORMAL HIGH (ref 70–99)

## 2021-08-21 LAB — PHOSPHORUS: Phosphorus: 2.7 mg/dL (ref 2.5–4.6)

## 2021-08-21 MED ORDER — METOPROLOL TARTRATE 25 MG PO TABS
12.5000 mg | ORAL_TABLET | Freq: Two times a day (BID) | ORAL | Status: DC
  Administered 2021-08-21: 12.5 mg via ORAL
  Filled 2021-08-21 (×2): qty 1

## 2021-08-21 MED ORDER — METOLAZONE 2.5 MG PO TABS
2.5000 mg | ORAL_TABLET | Freq: Once | ORAL | Status: AC
  Administered 2021-08-21: 2.5 mg via ORAL
  Filled 2021-08-21: qty 1

## 2021-08-21 NOTE — Progress Notes (Signed)
Physical Therapy Treatment Patient Details Name: Karen Farley MRN: [id number] DOB: 12-06-1945 Today's Date: 08/21/2021   History of Present Illness 75 y/o female history HFpEF, Afib on Eliquis, PAD, DM, CKDIII, lymphedema, HTN admitted to hospital 10/6 with sob, orthopnea, weight gain with hypotension, large left pleural effusion, generalized anasarca on admission. S/p left thoracentesis 10/8 673ml removed and paracentesis 10/12 1.2L removed. Failed Lasix IV advanced to Lasix gtt; however, Lasix and Eliquis stopped 10/15 and patient transferred to ICU after had episode of GI bleed with drop in hgb, s/p 2U pRBC and vasopressor support initiated. Echo done 10/16 showed large pericardial effusion s/p pericardiocentesis 790mL fluid removed with drain placed.    PT Comments    Patient making progress towards meeting functional goals. Increased standing tolerance this session. Patient stood from the bed and took several steps to the recliner with Min guard for safety without loss of balance. Encouraged patient to begin progressing ambulation, however she is hesitant due to fluid issues. Recommend to continue PT to maximize independence and facilitate return to prior level of function. Anticipate patient will be able to return home with intermittent assistance from spouse and HHPT.     Recommendations for follow up therapy are one component of a multi-disciplinary discharge planning process, led by the attending physician.  Recommendations may be updated based on patient status, additional functional criteria and insurance authorization.  Follow Up Recommendations  Home health PT     Assistance Recommended at Discharge Intermittent Supervision/Assistance  Equipment Recommendations  None recommended by PT    Recommendations for Other Services       Precautions / Restrictions Precautions Precautions: Fall Restrictions Weight Bearing Restrictions: No     Mobility  Bed Mobility Overal bed  mobility:  (not assessed as patient sitting up eating breakfast on arrival to room)                  Transfers Overall transfer level: Needs assistance Equipment used: None Transfers: Sit to/from [employer] Sit to Stand: Supervision Stand pivot transfers: Min guard         General transfer comment: patient declined using rolling walker for transfers. verbal cues for safety    Ambulation/Gait             General Gait Details: patient able to take several steps from bed to chair with Min guard assistance. patient declined walking due to feeling unable to becuase of fluid   Stairs             Wheelchair Mobility    Modified Rankin (Stroke Patients Only)       Balance   Sitting-balance support: Feet supported Sitting balance-Leahy Scale: Normal     Standing balance support: Single extremity supported;During functional activity Standing balance-Leahy Scale: Good Standing balance comment: brief single leg stance performed x 2 on each LE for donning/doffing of mesh underwear without loss of balance. close supervision for safety with unilateral UE support                            Cognition Arousal/Alertness: Awake/alert Behavior During Therapy: WFL for tasks assessed/performed Overall Cognitive Status: Within Functional Limits for tasks assessed                                          Exercises  General Comments General comments (skin integrity, edema, etc.): encouraged patient to have LE elevated and perform ankle pumps for edema management of LE      Pertinent Vitals/Pain Pain Assessment: No/denies pain    Home Living                          Prior Function            PT Goals (current goals can now be found in the care plan section) Acute Rehab PT Goals Patient Stated Goal: have more fluid pulled off and go home PT Goal Formulation: With patient Time For Goal Achievement:  08/29/21 Potential to Achieve Goals: Good Progress towards PT goals: Progressing toward goals    Frequency    Min 2X/week      PT Plan Current plan remains appropriate    Co-evaluation              AM-PAC PT "6 Clicks" Mobility   Outcome Measure  Help needed turning from your back to your side while in a flat bed without using bedrails?: A Little Help needed moving from lying on your back to sitting on the side of a flat bed without using bedrails?: A Little Help needed moving to and from a bed to a chair (including a wheelchair)?: A Little Help needed standing up from a chair using your arms (e.g., wheelchair or bedside chair)?: A Little Help needed to walk in hospital room?: A Little Help needed climbing 3-5 steps with a railing? : A Lot 6 Click Score: 17    End of Session   Activity Tolerance: Patient tolerated treatment well Patient left: in chair;with call bell/phone within reach;with chair alarm set Nurse Communication: Mobility status (discussed with nurse aide) PT Visit Diagnosis: Muscle weakness (generalized) (M62.81);Difficulty in walking, not elsewhere classified (R26.2);Unsteadiness on feet (R26.81)     Time: 8250-5397 PT Time Calculation (min) (ACUTE ONLY): 34 min  Charges:  $Therapeutic Activity: 23-37 mins                     [name], PT, MPT    [name] 08/21/2021, 10:25 AM

## 2021-08-21 NOTE — Progress Notes (Signed)
PROGRESS NOTE    Karen Farley  IFO:[id number] DOB: 30-Apr-1946 DOA: 07/26/2021 PCP: Valerie Roys, DO   Brief Narrative:  75 y.o. WF PMHx HFpEF, permanent atrial fibrillation on Eliquis, PAD, type 2 diabetes, CKD stage IIIb, lymphedema   Presented to hospital with increasing shortness of breath and edema.  Patient has been noticing increasing abdominal distention since her admission in August with severe lower extremity edema and dyspnea and exertion.   Subjective: 11/1 A/O x4 sitting in chair comfortably.  States able to move around the room much easier.   Assessment & Plan:  Covid vaccination; vaccinated 3/3  Principal Problem:   Acute blood loss anemia Active Problems:   Type 2 diabetes mellitus with renal complication (HCC)   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 diabetes mellitus (HCC)   Atrial fibrillation (HCC)   Acute heart failure with preserved ejection fraction (HFpEF) (HCC)   Pleural effusion   Acute on chronic diastolic CHF (congestive heart failure) (HCC)   Chronic anticoagulation   Shock circulatory (HCC)   Pericardial effusion   Acute respiratory failure with hypoxia (HCC)  GI bleed with hemorrhagic shock. --Patient was noted to have significant drop in hemoglobin to 9.7 from 12.4 and was transferred to the ICU.   --GI Dr Alice Reichert from 10/19-- May forego endoluminal evaluation for the near future given lack of bleeding recurrence and persistence of cardiopulmonary issues. Patient is in agreement. --10/23-- d/w Dr Russo--no Rectal bleed. Ok to resume po eliquis   Large pericardial effusion  -S/p percardiocentesis  Large Pleural effusion s/p thoracentesis Patient underwent a pericardiocentesis on 08/05/2021 with removal of 700 mL of pleural fluid.  Inflammatory markers were unremarkable.  Cardiology monitoring the patient while in the hospital.  Still on vasopressors due to hypotension. --repeat echo 10/21--no pericaridal effusion  Acute on chronic diastolic  CHF -Strict in and out -19.2 L (11/1) - Daily weight Filed Weights   08/19/21 0100 08/20/21 0700 08/21/21 0400  Weight: 93.1 kg 94.5 kg 94.6 kg    Severe Anasarca -status post thoracocentesis with removal of 600 mL of fluid on 07/28/2021. --10/20-- torsemide 20 mg qd with IV albumin --10/21-- IV lqasix 40 mg bid per cardiology --10/22 cont IV lasix--good uop --10/23--IV lasix --10/24-- Dr Juleen China wants to start IV lasxi gtt --10/25-cont Lasix gtt -11/1 decrease Metoprolol 12.5 mg BID per cardiology -11/1 Zaroxolyn 2.5 mg x 1    Hypotension suspect  -Secondary to hemorrhagic shock and low intravsacular volume --cont po  midodrine.   --IV Albumin       Acute kidney injury on CKD stage 3b(Baseline Cr 1.4-1.7) Lab Results  Component Value Date   CREATININE 1.67 (H) 08/21/2021   CREATININE 1.76 (H) 08/20/2021   CREATININE 1.94 (H) 08/19/2021   CREATININE 1.85 (H) 08/18/2021   CREATININE 2.06 (H) 08/17/2021  -Currently at baseline.   --Nephrology followed the patient during hospitalization.  -- Benazepril and spironolactone on hold.   Permanent atrial fibrillation --Was previously on Eliquis.  Currently on hold due to GI bleed.  -- Rate controlled at this time--not on any po rate controlling meds --10/21--started on IV heparin gtt--monitor hgb  --10/22-- hgb remains stable --10/23-- discussed with G.I. Dr. Virgina Jock and cardiology Dr. Rockey Situ plan is to move forward with starting eliquis while patient is in-house to make sure she tolerates. DC heparin drip. Patient is in agreement with plan. Lab Results  Component Value Date   HGB 9.9 (L) 08/21/2021   HGB 9.6 (L) 08/20/2021  HGB 9.6 (L) 08/19/2021   HGB 9.7 (L) 08/18/2021   HGB 10.1 (L) 08/17/2021   Pulmonary HTN - See A. fib  DM type II with hyperglycemia Diet controlled at home.   -- Sensitive SSI  Iron deficiency/Vitamin B12  --patient received Venofer -cont oral iron and vitamin B12  supplements.  Hypokalemia -Potassium goal> 4 - 10/31 Potassium IV 50 mEq - 10/31  K-Dur 40 mEq TID  Hypomagnesmia - Magnesium goal> 2 - 10/28 Magnesium IV 3 g   Morbidly Obese  Body mass index is 38.15 kg/m.     DVT prophylaxis: Eliquis  Code Status: Full Family Communication: 11/1 husband at bedside for discussion plan of care all questions answered Status is: Inpatient    Dispo: The patient is from: Home              Anticipated d/c is to: Home              Anticipated d/c date is: > 3 days              Patient currently is not medically stable to d/c.      Consultants:  Nephrology IR    Procedures/Significant Events:  10/8 s/p left thoracentesis: 647ml aspirated 10/12 paracentesis 1.2L aspirated .  10/16 Echocardiogram performed: Pericardiocentesis 766ml aspirated and drain placed  Failed Lasix IV advanced to Lasix gtt; however, Lasix and Eliquis stopped 10/15 and patient transferred to ICU after had episode of GI bleed with drop in hgb,  s/p 2U pRBC and vasopressor support initiated.    10/21 Echocardiogram LVEF= 60 to 65%.  -Pulmonary HTN: Assumed right atrial pressure of 15 mmHg, the  estimated right ventricular systolic pressure is 62.8 mmHg.  Pericardium: A small pericardial effusion is present.  10/27 LEFT thoracentesis: Aspirated 700 mL of clear yellow fluid was removed      I have personally reviewed and interpreted all radiology studies and my findings are as above.  VENTILATOR SETTINGS:    Cultures   Antimicrobials: Anti-infectives (From admission, onward)    None         Devices    LINES / TUBES:      Continuous Infusions:  sodium chloride Stopped (08/18/21 0325)   furosemide (LASIX) 200 mg in dextrose 5% 100 mL (2mg /mL) infusion 8 mg/hr (08/21/21 0612)     Objective: Vitals:   08/20/21 2353 08/21/21 0344 08/21/21 0400 08/21/21 0734  BP: (!) 159/133 (!) 88/62  (!) 89/67  Pulse: 91 85  68  Resp: 18 17  18    Temp: 98.4 F (36.9 C) 98.2 F (36.8 C)  98 F (36.7 C)  TempSrc:      SpO2: 99% 92%  93%  Weight:   94.6 kg   Height:        Intake/Output Summary (Last 24 hours) at 08/21/2021 0813 Last data filed at 08/21/2021 3151 Gross per 24 hour  Intake 1088.97 ml  Output 3400 ml  Net -2311.03 ml    Filed Weights   08/19/21 0100 08/20/21 0700 08/21/21 0400  Weight: 93.1 kg 94.5 kg 94.6 kg    Examination:  General: A/O x4 No acute respiratory distress Eyes: negative scleral hemorrhage, negative anisocoria, negative icterus ENT: Negative Runny nose, negative gingival bleeding, Neck:  Negative scars, masses, torticollis, lymphadenopathy, JVD Lungs: Clear to auscultation bilaterally without wheezes or crackles Cardiovascular: Regular rate and rhythm without murmur gallop or rub normal S1 and S2 Abdomen: Positive MORBID OBESITY, positive ANASARCA abdominal pain, nondistended, positive soft, bowel  sounds, no rebound, no ascites, no appreciable mass Extremities: POSITIVE bilateral lower extremity edema (ANASARCA) Skin: Negative rashes, lesions, ulcers Psychiatric:  Negative depression, negative anxiety, negative fatigue, negative mania  Central nervous system:  Cranial nerves II through XII intact, tongue/uvula midline, all extremities muscle strength 5/5, sensation intact throughout, negative dysarthria, negative expressive aphasia, negative receptive aphasia.  .     Data Reviewed: Care during the described time interval was provided by me .  I have reviewed this patient's available data, including medical history, events of note, physical examination, and all test results as part of my evaluation.   CBC: Recent Labs  Lab 08/17/21 1415 08/18/21 0637 08/19/21 0635 08/20/21 0650 08/21/21 0540  WBC 6.2 5.6 6.2 5.7 4.5  NEUTROABS  --   --   --   --  3.2  HGB 10.1* 9.7* 9.6* 9.6* 9.9*  HCT 31.1* 30.6* 29.6* 30.1* 32.8*  MCV 88.1 89.0 88.9 89.1 89.9  PLT 369 341 351 356 380     Basic Metabolic Panel: Recent Labs  Lab 08/17/21 1415 08/18/21 0637 08/19/21 0635 08/20/21 0650 08/21/21 0540  NA 135 137 139 138 138  K 2.8* 3.1* 2.9* 3.3* 3.8  CL 90* 93* 93* 93* 96*  CO2 31 34* 36* 35* 34*  GLUCOSE 206* 161* 155* 174* 178*  BUN 40* 44* 41* 45* 43*  CREATININE 2.06* 1.85* 1.94* 1.76* 1.67*  CALCIUM 9.5 9.4 9.2 9.3 9.4  MG 1.8 2.7* 2.3 2.2 2.1  PHOS 2.9 3.4 3.0 3.1 2.7    GFR: Estimated Creatinine Clearance: 31.2 mL/min (A) (by C-G formula based on SCr of 1.67 mg/dL (H)). Liver Function Tests: Recent Labs  Lab 08/17/21 1415 08/18/21 0637 08/19/21 0635 08/20/21 0650 08/21/21 0540  AST 34 26 22 19 20   ALT 21 19 16 16 14   ALKPHOS 77 74 71 71 77  BILITOT 2.4* 2.2* 2.5* 2.3* 2.0*  PROT 6.3* 6.1* 6.2* 6.1* 6.1*  ALBUMIN 3.8 3.8 3.8 3.8 3.6    No results for input(s): LIPASE, AMYLASE in the last 168 hours. No results for input(s): AMMONIA in the last 168 hours. Coagulation Profile: No results for input(s): INR, PROTIME in the last 168 hours.  Cardiac Enzymes: No results for input(s): CKTOTAL, CKMB, CKMBINDEX, TROPONINI in the last 168 hours. BNP (last 3 results) No results for input(s): PROBNP in the last 8760 hours. HbA1C: No results for input(s): HGBA1C in the last 72 hours. CBG: Recent Labs  Lab 08/20/21 0803 08/20/21 1225 08/20/21 1631 08/20/21 2141 08/21/21 0734  GLUCAP 158* 246* 175* 187* 165*    Lipid Profile: No results for input(s): CHOL, HDL, LDLCALC, TRIG, CHOLHDL, LDLDIRECT in the last 72 hours. Thyroid Function Tests: No results for input(s): TSH, T4TOTAL, FREET4, T3FREE, THYROIDAB in the last 72 hours. Anemia Panel: No results for input(s): VITAMINB12, FOLATE, FERRITIN, TIBC, IRON, RETICCTPCT in the last 72 hours.  Urine analysis:    Component Value Date/Time   COLORURINE YELLOW (A) 04/11/2021 1215   APPEARANCEUR HAZY (A) 04/11/2021 1215   APPEARANCEUR Clear 06/06/2020 1339   LABSPEC 1.011 04/11/2021 1215    PHURINE 5.0 04/11/2021 1215   GLUCOSEU NEGATIVE 04/11/2021 1215   HGBUR NEGATIVE 04/11/2021 1215   BILIRUBINUR NEGATIVE 04/11/2021 1215   BILIRUBINUR Negative 06/06/2020 1339   KETONESUR NEGATIVE 04/11/2021 1215   PROTEINUR NEGATIVE 04/11/2021 1215   NITRITE NEGATIVE 04/11/2021 1215   LEUKOCYTESUR MODERATE (A) 04/11/2021 1215   Sepsis Labs: @LABRCNTIP (procalcitonin:4,lacticidven:4)  ) Recent Results (from the past 240 hour(s))  Acid Fast Smear (AFB)     Status: None   Collection Time: 08/16/21 11:46 AM   Specimen: PATH Cytology Pleural fluid  Result Value Ref Range Status   AFB Specimen Processing Concentration  Final   Acid Fast Smear Negative  Final    Comment: (NOTE) Performed At: [facility] [place], [place] [id number] Rush Farmer MD WU:[id number]    Source (AFB) PLEURAL  Final    Comment: Performed at [facility], [address] [place], [place] [address]  Body fluid culture w Gram Stain     Status: None   Collection Time: 08/16/21 11:46 AM   Specimen: PATH Cytology Pleural fluid  Result Value Ref Range Status   Specimen Description   Final    PLEURAL Performed at [facility], [address], [place], [place] [address]    Special Requests   Final    NONE Performed at [facility], [address] [place], [place] [address]    Gram Stain   Final    WBC PRESENT, PREDOMINANTLY MONONUCLEAR NO ORGANISMS SEEN CYTOSPIN SMEAR    Culture   Final    NO GROWTH 3 DAYS Performed at [facility], [place] [address] [place], [place] [address]    Report Status 08/19/2021 FINAL  Final          Radiology Studies: ECHOCARDIOGRAM LIMITED  Result Date: 08/19/2021    ECHOCARDIOGRAM LIMITED REPORT   Patient Name:   Karen Farley Date of Exam: 08/19/2021 Medical Rec #:  [id number]          Height:       62.0 in Accession #:    [id number]         Weight:       205.2 lb Date of Birth:  September 22, 1946           BSA:           1.933 m Patient Age:    1 years           BP:           92/51 mmHg Patient Gender: F                  HR:           79 bpm. Exam Location:  [facility] Procedure: Limited Echo Indications:     Pericardial Effusion  History:         Patient has prior history of Echocardiogram examinations.                  Arrythmias:Atrial Fibrillation; Risk Factors:Diabetes,                  Hypertension and CKD IIIb, Morbid Obesity.  Sonographer:     L Thornton-Maynard Referring Phys:  [id number] [place] Diagnosing Phys: Skeet Latch MD IMPRESSIONS  1. Left ventricular ejection fraction, by estimation, is 60 to 65%. The left ventricle has normal function. The left ventricle has no regional wall motion abnormalities. Left ventricular diastolic function could not be evaluated.  2. Right ventricular systolic function is normal. The right ventricular size is normal.  3. Large pericardial effusion slightly smaller than on the echo 08/15/21. There is echogenic material in the pericardium consistent with thrombus. No evidence of tamponade. Large pericardial effusion. Moderate pleural effusion in the left lateral region.  4. The inferior vena cava is normal in size with greater than 50% respiratory variability, suggesting right atrial  pressure of 3 mmHg. FINDINGS  Left Ventricle: Left ventricular ejection fraction, by estimation, is 60 to 65%. The left ventricle has normal function. The left ventricle has no regional wall motion abnormalities. The left ventricular internal cavity size was normal in size. There is  no left ventricular hypertrophy. Left ventricular diastolic function could not be evaluated. Right Ventricle: The right ventricular size is normal. No increase in right ventricular wall thickness. Right ventricular systolic function is normal. Pericardium: Large pericardial effusion slightly smaller than on the echo 08/15/21. There is echogenic material in the pericardium consistent with thrombus. No evidence of tamponade.  A large pericardial effusion is present. There is excessive respiratory  variation in the tricuspid valve spectral Doppler velocities. Venous: The inferior vena cava is normal in size with greater than 50% respiratory variability, suggesting right atrial pressure of 3 mmHg. Additional Comments: There is a moderate pleural effusion in the left lateral region. LEFT VENTRICLE PLAX 2D LVIDd:         3.30 cm LVIDs:         1.80 cm LV PW:         1.10 cm LV IVS:        1.00 cm  Skeet Latch MD Electronically signed by Skeet Latch MD Signature Date/Time: 08/19/2021/2:14:10 PM    Final         Scheduled Meds:  apixaban  5 mg Oral BID   Chlorhexidine Gluconate Cloth  6 each Topical Daily   docusate sodium  100 mg Oral Daily   folic acid  1 mg Oral Daily   hydrocortisone   Rectal TID   insulin aspart  0-5 Units Subcutaneous QHS   insulin aspart  0-9 Units Subcutaneous TID WC   iron polysaccharides  150 mg Oral Daily   latanoprost  1 drop Both Eyes QHS   metolazone  2.5 mg Oral Once   metoprolol tartrate  12.5 mg Oral BID   midodrine  10 mg Oral TID WC   pantoprazole (PROTONIX) IV  40 mg Intravenous Q12H   polyethylene glycol  17 g Oral Daily   potassium chloride  40 mEq Oral TID   Ensure Max Protein  11 oz Oral BID   spironolactone  25 mg Oral Daily   vitamin B-12  500 mcg Oral Daily   Continuous Infusions:  sodium chloride Stopped (08/18/21 0325)   furosemide (LASIX) 200 mg in dextrose 5% 100 mL (2mg /mL) infusion 8 mg/hr (08/21/21 0612)     LOS: 26 days   The patient is critically ill with multiple organ systems failure and requires high complexity decision making for assessment and support, frequent evaluation and titration of therapies, application of advanced monitoring technologies and extensive interpretation of multiple databases. Critical Care Time devoted to patient care services described in this note  Time spent: 40 minutes     Emrey Thornley, Geraldo Docker, MD [facility]   If 7PM-7AM, please contact night-coverage 08/21/2021, 8:13 AM

## 2021-08-21 NOTE — Progress Notes (Signed)
[facility]  ROUNDING NOTE   Subjective:   [name] was admitted to [facility] on 07/26/2021 for Morbid obesity ([place]) [E66.01] DOE (dyspnea on exertion) [R06.09] Recurrent left pleural effusion [J90] Hypotension [I95.9] Type 2 diabetes mellitus without complication, without long-term current use of insulin ([place]) [E11.9] Atrial fibrillation, unspecified type ([place]) [I48.91] Chronic congestive heart failure, unspecified heart failure type ([place]) [I50.9]  Patient seen sitting on side of bed  Currently consuming breakfast Edema improving slowly   Objective:  Vital signs in last 24 hours:  Temp:  [97.7 F (36.5 C)-98.7 F (37.1 C)] 98.1 F (36.7 C) (11/01 1122) Pulse Rate:  [68-91] 80 (11/01 1122) Resp:  [17-20] 17 (11/01 1122) BP: (88-159)/(54-133) 90/54 (11/01 1122) SpO2:  [91 %-100 %] 98 % (11/01 1122) Weight:  [93.1 kg-94.6 kg] 93.1 kg (11/01 0958)  Weight change: 0.116 kg Filed Weights   08/20/21 0700 08/21/21 0400 08/21/21 0958  Weight: 94.5 kg 94.6 kg 93.1 kg    Intake/Output: I/O last 3 completed shifts: In: 4098 [P.O.:960; I.V.:287.4; IV Piggyback:57.6] Out: 2500 [Urine:2500]   Intake/Output this shift:  Total I/O In: 840 [P.O.:840] Out: 1400 [Urine:1400]  Physical Exam: General: In no acute distress, sitting at bedside  Head: oral mucosal membranes moist  Lungs:  Clear bilaterally, normal effort, dry cough  Heart: S1S2, irregular  Abdomen:  Non tender  Extremities:  ++ peripheral edema. Anasarca +  Neurologic: Awake, alert, able to answer questions appropriately  Skin: No acute lesions or rashes        Basic Metabolic Panel: Recent Labs  Lab 08/17/21 1415 08/18/21 0637 08/19/21 0635 08/20/21 0650 08/21/21 0540  NA 135 137 139 138 138  K 2.8* 3.1* 2.9* 3.3* 3.8  CL 90* 93* 93* 93* 96*  CO2 31 34* 36* 35* 34*  GLUCOSE 206* 161* 155* 174* 178*  BUN 40* 44* 41* 45* 43*  CREATININE 2.06* 1.85* 1.94* 1.76* 1.67*  CALCIUM 9.5  9.4 9.2 9.3 9.4  MG 1.8 2.7* 2.3 2.2 2.1  PHOS 2.9 3.4 3.0 3.1 2.7     Liver Function Tests: Recent Labs  Lab 08/17/21 1415 08/18/21 0637 08/19/21 0635 08/20/21 0650 08/21/21 0540  AST 34 26 22 19 20   ALT 21 19 16 16 14   ALKPHOS 77 74 71 71 77  BILITOT 2.4* 2.2* 2.5* 2.3* 2.0*  PROT 6.3* 6.1* 6.2* 6.1* 6.1*  ALBUMIN 3.8 3.8 3.8 3.8 3.6    No results for input(s): LIPASE, AMYLASE in the last 168 hours. No results for input(s): AMMONIA in the last 168 hours.  CBC: Recent Labs  Lab 08/17/21 1415 08/18/21 0637 08/19/21 0635 08/20/21 0650 08/21/21 0540  WBC 6.2 5.6 6.2 5.7 4.5  NEUTROABS  --   --   --   --  3.2  HGB 10.1* 9.7* 9.6* 9.6* 9.9*  HCT 31.1* 30.6* 29.6* 30.1* 32.8*  MCV 88.1 89.0 88.9 89.1 89.9  PLT 369 341 351 356 380     Cardiac Enzymes: No results for input(s): CKTOTAL, CKMB, CKMBINDEX, TROPONINI in the last 168 hours.  BNP: Invalid input(s): POCBNP  CBG: Recent Labs  Lab 08/20/21 1225 08/20/21 1631 08/20/21 2141 08/21/21 0734 08/21/21 1121  GLUCAP 246* 175* 187* 165* 219*     Microbiology: Results for orders placed or performed during the hospital encounter of 07/26/21  Resp Panel by RT-PCR (Flu A&B, Covid) Nasopharyngeal Swab     Status: None   Collection Time: 07/26/21  7:19 PM   Specimen: Nasopharyngeal Swab;  Nasopharyngeal(NP) swabs in vial transport medium  Result Value Ref Range Status   SARS Coronavirus 2 by RT PCR NEGATIVE NEGATIVE Final    Comment: (NOTE) SARS-CoV-2 target nucleic acids are NOT DETECTED.  The SARS-CoV-2 RNA is generally detectable in upper respiratory specimens during the acute phase of infection. The lowest concentration of SARS-CoV-2 viral copies this assay can detect is 138 copies/mL. A negative result does not preclude SARS-Cov-2 infection and should not be used as the sole basis for treatment or other patient management decisions. A negative result may occur with  improper specimen  collection/handling, submission of specimen other than nasopharyngeal swab, presence of viral mutation(s) within the areas targeted by this assay, and inadequate number of viral copies(<138 copies/mL). A negative result must be combined with clinical observations, patient history, and epidemiological information. The expected result is Negative.  Fact Sheet for Patients:  EntrepreneurPulse.com.au  Fact Sheet for Healthcare Providers:  IncredibleEmployment.be  This test is no t yet approved or cleared by the [place] FDA and  has been authorized for detection and/or diagnosis of SARS-CoV-2 by FDA under an Emergency Use Authorization (EUA). This EUA will remain  in effect (meaning this test can be used) for the duration of the COVID-19 declaration under Section 564(b)(1) of the Act, 21 U.S.C.section 360bbb-3(b)(1), unless the authorization is terminated  or revoked sooner.       Influenza A by PCR NEGATIVE NEGATIVE Final   Influenza B by PCR NEGATIVE NEGATIVE Final    Comment: (NOTE) The Xpert Xpress SARS-CoV-2/FLU/RSV plus assay is intended as an aid in the diagnosis of influenza from Nasopharyngeal swab specimens and should not be used as a sole basis for treatment. Nasal washings and aspirates are unacceptable for Xpert Xpress SARS-CoV-2/FLU/RSV testing.  Fact Sheet for Patients: EntrepreneurPulse.com.au  Fact Sheet for Healthcare Providers: IncredibleEmployment.be  This test is not yet approved or cleared by the [place] FDA and has been authorized for detection and/or diagnosis of SARS-CoV-2 by FDA under an Emergency Use Authorization (EUA). This EUA will remain in effect (meaning this test can be used) for the duration of the COVID-19 declaration under Section 564(b)(1) of the Act, 21 U.S.C. section 360bbb-3(b)(1), unless the authorization is terminated or revoked.  Performed at [facility], [address] [place], [place] [address]   Body fluid culture w Gram Stain     Status: None   Collection Time: 07/28/21  1:53 PM   Specimen: PATH Cytology Pleural fluid  Result Value Ref Range Status   Specimen Description   Final    PLEURAL Performed at [facility], [address], [place], [place] [address]    Special Requests   Final    NONE Performed at [facility], [address] [place], [place] [address]    Gram Stain   Final    NO SQUAMOUS EPITHELIAL CELLS SEEN FEW WBC SEEN NO ORGANISMS SEEN    Culture   Final    NO GROWTH 3 DAYS Performed at [facility], [place] [address], [place], [place] [address]    Report Status 08/01/2021 FINAL  Final  Body fluid culture w Gram Stain     Status: None   Collection Time: 08/01/21  3:36 PM   Specimen: PATH Cytology Peritoneal fluid  Result Value Ref Range Status   Specimen Description   Final    PERITONEAL Performed at [facility], [address], [place], [place] [address]    Special Requests  Final    NONE Performed at [facility], [place], [place] [address]    Gram Stain   Final    RARE WBC PRESENT,BOTH PMN AND MONONUCLEAR NO ORGANISMS SEEN    Culture   Final    NO GROWTH 3 DAYS Performed at [facility], [place] [address], [place], [place] [address]    Report Status 08/05/2021 FINAL  Final  MRSA Next Gen by PCR, Nasal     Status: None   Collection Time: 08/04/21  8:56 PM   Specimen: Nasal Mucosa; Nasal Swab  Result Value Ref Range Status   MRSA by PCR Next Gen NOT DETECTED NOT DETECTED Final    Comment: (NOTE) The GeneXpert MRSA Assay (FDA approved for NASAL specimens only), is one component of a comprehensive MRSA colonization surveillance program. It is not intended to diagnose MRSA infection nor to guide or monitor treatment for MRSA infections. Test performance is not FDA approved in patients less than 39  years old. Performed at [facility], [address] [place], [place] [address]   Body fluid culture w Gram Stain     Status: None   Collection Time: 08/05/21  5:50 PM   Specimen: Pericardial  Result Value Ref Range Status   Specimen Description   Final    PERICARDIAL Performed at [facility], [address], [place], [place] [address]    Special Requests   Final    NONE Performed at [facility], [address] [place], [place] [address]    Gram Stain   Final    FEW WBC PRESENT, PREDOMINANTLY MONONUCLEAR NO ORGANISMS SEEN    Culture   Final    NO GROWTH 3 DAYS Performed at [facility], [place] [address], [place], [place] [address]    Report Status 08/09/2021 FINAL  Final  Acid Fast Smear (AFB)     Status: None   Collection Time: 08/16/21 11:46 AM   Specimen: PATH Cytology Pleural fluid  Result Value Ref Range Status   AFB Specimen Processing Concentration  Final   Acid Fast Smear Negative  Final    Comment: (NOTE) Performed At: [facility] [place], [place] [id number] Rush Farmer MD [id number]    Source (AFB) PLEURAL  Final    Comment: Performed at [facility], [address] [place], [place] [address]  Body fluid culture w Gram Stain     Status: None   Collection Time: 08/16/21 11:46 AM   Specimen: PATH Cytology Pleural fluid  Result Value Ref Range Status   Specimen Description   Final    PLEURAL Performed at [facility], [address], [place], [place] [address]    Special Requests   Final    NONE Performed at [facility], [address] [place], [place] [address]    Gram Stain   Final    WBC PRESENT, PREDOMINANTLY MONONUCLEAR NO ORGANISMS SEEN CYTOSPIN SMEAR    Culture   Final    NO GROWTH 3 DAYS Performed at [facility], [place] [address], [place], [place] [address]    Report Status 08/19/2021 FINAL  Final    Coagulation Studies: No results  for input(s): LABPROT, INR in the last 72 hours.   Urinalysis: No results for input(s): COLORURINE, LABSPEC, PHURINE, GLUCOSEU, HGBUR, BILIRUBINUR, KETONESUR, PROTEINUR, UROBILINOGEN, NITRITE, LEUKOCYTESUR in the last 72 hours.  Invalid input(s): APPERANCEUR    Imaging: ECHOCARDIOGRAM LIMITED  Result  Date: 08/19/2021    ECHOCARDIOGRAM LIMITED REPORT   Patient Name:   Karen Farley Date of Exam: 08/19/2021 Medical Rec #:  [id number]          Height:       62.0 in Accession #:    [id number]         Weight:       205.2 lb Date of Birth:  1945-12-27           BSA:          1.933 m Patient Age:    31 years           BP:           92/51 mmHg Patient Gender: F                  HR:           79 bpm. Exam Location:  [facility] Procedure: Limited Echo Indications:     Pericardial Effusion  History:         Patient has prior history of Echocardiogram examinations.                  Arrythmias:Atrial Fibrillation; Risk Factors:Diabetes,                  Hypertension and CKD IIIb, Morbid Obesity.  Sonographer:     L Thornton-Maynard Referring Phys:  [id number] [place] Diagnosing Phys: Skeet Latch MD IMPRESSIONS  1. Left ventricular ejection fraction, by estimation, is 60 to 65%. The left ventricle has normal function. The left ventricle has no regional wall motion abnormalities. Left ventricular diastolic function could not be evaluated.  2. Right ventricular systolic function is normal. The right ventricular size is normal.  3. Large pericardial effusion slightly smaller than on the echo 08/15/21. There is echogenic material in the pericardium consistent with thrombus. No evidence of tamponade. Large pericardial effusion. Moderate pleural effusion in the left lateral region.  4. The inferior vena cava is normal in size with greater than 50% respiratory variability, suggesting right atrial pressure of 3 mmHg. FINDINGS  Left Ventricle: Left ventricular ejection fraction, by estimation, is 60 to 65%. The left  ventricle has normal function. The left ventricle has no regional wall motion abnormalities. The left ventricular internal cavity size was normal in size. There is  no left ventricular hypertrophy. Left ventricular diastolic function could not be evaluated. Right Ventricle: The right ventricular size is normal. No increase in right ventricular wall thickness. Right ventricular systolic function is normal. Pericardium: Large pericardial effusion slightly smaller than on the echo 08/15/21. There is echogenic material in the pericardium consistent with thrombus. No evidence of tamponade. A large pericardial effusion is present. There is excessive respiratory  variation in the tricuspid valve spectral Doppler velocities. Venous: The inferior vena cava is normal in size with greater than 50% respiratory variability, suggesting right atrial pressure of 3 mmHg. Additional Comments: There is a moderate pleural effusion in the left lateral region. LEFT VENTRICLE PLAX 2D LVIDd:         3.30 cm LVIDs:         1.80 cm LV PW:         1.10 cm LV IVS:        1.00 cm  Skeet Latch MD Electronically signed by Skeet Latch MD Signature Date/Time: 08/19/2021/2:14:10 PM    Final      Medications:    sodium chloride Stopped (08/18/21 0325)  furosemide (LASIX) 200 mg in dextrose 5% 100 mL (2mg /mL) infusion 8 mg/hr (08/21/21 0612)    apixaban  5 mg Oral BID   Chlorhexidine Gluconate Cloth  6 each Topical Daily   docusate sodium  100 mg Oral Daily   folic acid  1 mg Oral Daily   hydrocortisone   Rectal TID   insulin aspart  0-5 Units Subcutaneous QHS   insulin aspart  0-9 Units Subcutaneous TID WC   iron polysaccharides  150 mg Oral Daily   latanoprost  1 drop Both Eyes QHS   metoprolol tartrate  12.5 mg Oral BID   midodrine  10 mg Oral TID WC   pantoprazole (PROTONIX) IV  40 mg Intravenous Q12H   polyethylene glycol  17 g Oral Daily   potassium chloride  40 mEq Oral TID   Ensure Max Protein  11 oz Oral BID    spironolactone  25 mg Oral Daily   vitamin B-12  500 mcg Oral Daily     Assessment/ Plan:  [name] is a 75 y.o. white female with diastolic congestive heart failure, hypertension, hyperlipidemia, lymphedema, atrial fibrillation, peripheral vascular disease who is admitted to [facility] on 07/26/2021 for Morbid obesity ([place]) [E66.01] DOE (dyspnea on exertion) [R06.09] Recurrent left pleural effusion [J90] Hypotension [I95.9] Type 2 diabetes mellitus without complication, without long-term current use of insulin ([place]) [E11.9] Atrial fibrillation, unspecified type ([place]) [I48.91] Chronic congestive heart failure, unspecified heart failure type ([place]) [I50.9]  #Acute kidney injury on chronic kidney disease stage IIIB: with baseline creatinine of 1.66, GFR of 36 on 07/13/21. History of bland urine. 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 Hold benazepril. Creatinine stable. Recorded urine output 2.5L in preceding 24 hours Furosemide infusion 8 mg/hr and spironolactone.   #Hypotension Blood Pressure soft 89/67 this morning Midodrine 10 mg 3 times a day  #Acute exacerbation of diastolic congestive heart failure:  ECHO performed on 08/19/21 shows large pericardial effusion Cardiology team following and monitoring. Prescribed Metolazone 2.5mg  po once yesterday and today. No acute respiratory distress        LOS: 26 Taylr Meuth 11/1/202212:10 PM

## 2021-08-21 NOTE — Progress Notes (Signed)
Progress Note  Patient Name: Karen Farley Date of Encounter: 08/21/2021  [facility] Cardiologist: Kate Sable, MD   Subjective   Feels about the same with minimal shortness of breath.  No chest pain.  Arm swelling has resolved and leg edema stable to slightly improved.  Inpatient Medications    Scheduled Meds:  apixaban  5 mg Oral BID   Chlorhexidine Gluconate Cloth  6 each Topical Daily   docusate sodium  100 mg Oral Daily   folic acid  1 mg Oral Daily   hydrocortisone   Rectal TID   insulin aspart  0-5 Units Subcutaneous QHS   insulin aspart  0-9 Units Subcutaneous TID WC   iron polysaccharides  150 mg Oral Daily   latanoprost  1 drop Both Eyes QHS   metolazone  2.5 mg Oral Once   metoprolol tartrate  12.5 mg Oral Q6H   midodrine  10 mg Oral TID WC   pantoprazole (PROTONIX) IV  40 mg Intravenous Q12H   polyethylene glycol  17 g Oral Daily   potassium chloride  40 mEq Oral TID   Ensure Max Protein  11 oz Oral BID   spironolactone  25 mg Oral Daily   vitamin B-12  500 mcg Oral Daily   Continuous Infusions:  sodium chloride Stopped (08/18/21 0325)   furosemide (LASIX) 200 mg in dextrose 5% 100 mL (2mg /mL) infusion 8 mg/hr (08/21/21 0612)   PRN Meds: acetaminophen, bisacodyl, lidocaine HCl (PF), lip balm, traMADol   Vital Signs    Vitals:   08/20/21 2353 08/21/21 0344 08/21/21 0400 08/21/21 0734  BP: (!) 159/133 (!) 88/62  (!) 89/67  Pulse: 91 85  68  Resp: 18 17  18   Temp: 98.4 F (36.9 C) 98.2 F (36.8 C)  98 F (36.7 C)  TempSrc:      SpO2: 99% 92%  93%  Weight:   94.6 kg   Height:        Intake/Output Summary (Last 24 hours) at 08/21/2021 0744 Last data filed at 08/21/2021 0731 Gross per 24 hour  Intake 1088.97 ml  Output 3400 ml  Net -2311.03 ml   Last 3 Weights 08/21/2021 08/20/2021 08/19/2021  Weight (lbs) 208 lb 8.9 oz 208 lb 4.8 oz 205 lb 4 oz  Weight (kg) 94.6 kg 94.484 kg 93.1 kg      Telemetry    Atrial fibrillation  (ventricular rate 70-90 bpm) - Personally Reviewed  ECG    No new tracing.  Physical Exam   GEN: No acute distress.   Neck: JVP ~8 cm. Cardiac: Irregularly irregular with 2/6 systolic murmur. Respiratory: Clear to auscultation bilaterally. GI: Soft, nontender, non-distended  MS: 1+ pretibial edema with both calves wrapped with Ace bandages; No deformity. Neuro:  Nonfocal  Psych: Normal affect   Labs    High Sensitivity Troponin:   Recent Labs  Lab 07/26/21 1708  TROPONINIHS 17     Chemistry Recent Labs  Lab 08/19/21 0635 08/20/21 0650 08/21/21 0540  NA 139 138 138  K 2.9* 3.3* 3.8  CL 93* 93* 96*  CO2 36* 35* 34*  GLUCOSE 155* 174* 178*  BUN 41* 45* 43*  CREATININE 1.94* 1.76* 1.67*  CALCIUM 9.2 9.3 9.4  MG 2.3 2.2 2.1  PROT 6.2* 6.1* 6.1*  ALBUMIN 3.8 3.8 3.6  AST 22 19 20   ALT 16 16 14   ALKPHOS 71 71 77  BILITOT 2.5* 2.3* 2.0*  GFRNONAA 27* 30* 32*  ANIONGAP 10 10 8  Lipids No results for input(s): CHOL, TRIG, HDL, LABVLDL, LDLCALC, CHOLHDL in the last 168 hours.  Hematology Recent Labs  Lab 08/19/21 0635 08/20/21 0650 08/21/21 0540  WBC 6.2 5.7 4.5  RBC 3.33* 3.38* 3.65*  HGB 9.6* 9.6* 9.9*  HCT 29.6* 30.1* 32.8*  MCV 88.9 89.1 89.9  MCH 28.8 28.4 27.1  MCHC 32.4 31.9 30.2  RDW 26.3* 25.7* 25.8*  PLT 351 356 380   Thyroid No results for input(s): TSH, FREET4 in the last 168 hours.  BNPNo results for input(s): BNP, PROBNP in the last 168 hours.  DDimer No results for input(s): DDIMER in the last 168 hours.   Radiology    ECHOCARDIOGRAM LIMITED  Result Date: 08/19/2021    ECHOCARDIOGRAM LIMITED REPORT   Patient Name:   Karen Farley Date of Exam: 08/19/2021 Medical Rec #:  [id number]          Height:       62.0 in Accession #:    [id number]         Weight:       205.2 lb Date of Birth:  06-11-1946           BSA:          1.933 m Patient Age:    75 years           BP:           92/51 mmHg Patient Gender: F                  HR:            79 bpm. Exam Location:  [facility] Procedure: Limited Echo Indications:     Pericardial Effusion  History:         Patient has prior history of Echocardiogram examinations.                  Arrythmias:Atrial Fibrillation; Risk Factors:Diabetes,                  Hypertension and CKD IIIb, Morbid Obesity.  Sonographer:     L Thornton-Maynard Referring Phys:  [id number] [place] Diagnosing Phys: Skeet Latch MD IMPRESSIONS  1. Left ventricular ejection fraction, by estimation, is 60 to 65%. The left ventricle has normal function. The left ventricle has no regional wall motion abnormalities. Left ventricular diastolic function could not be evaluated.  2. Right ventricular systolic function is normal. The right ventricular size is normal.  3. Large pericardial effusion slightly smaller than on the echo 08/15/21. There is echogenic material in the pericardium consistent with thrombus. No evidence of tamponade. Large pericardial effusion. Moderate pleural effusion in the left lateral region.  4. The inferior vena cava is normal in size with greater than 50% respiratory variability, suggesting right atrial pressure of 3 mmHg. FINDINGS  Left Ventricle: Left ventricular ejection fraction, by estimation, is 60 to 65%. The left ventricle has normal function. The left ventricle has no regional wall motion abnormalities. The left ventricular internal cavity size was normal in size. There is  no left ventricular hypertrophy. Left ventricular diastolic function could not be evaluated. Right Ventricle: The right ventricular size is normal. No increase in right ventricular wall thickness. Right ventricular systolic function is normal. Pericardium: Large pericardial effusion slightly smaller than on the echo 08/15/21. There is echogenic material in the pericardium consistent with thrombus. No evidence of tamponade. A large pericardial effusion is present. There is excessive respiratory  variation in the tricuspid valve  spectral Doppler  velocities. Venous: The inferior vena cava is normal in size with greater than 50% respiratory variability, suggesting right atrial pressure of 3 mmHg. Additional Comments: There is a moderate pleural effusion in the left lateral region. LEFT VENTRICLE PLAX 2D LVIDd:         3.30 cm LVIDs:         1.80 cm LV PW:         1.10 cm LV IVS:        1.00 cm  Skeet Latch MD Electronically signed by Skeet Latch MD Signature Date/Time: 08/19/2021/2:14:10 PM    Final     Cardiac Studies   See limited TTE above.  Patient Profile     75 y.o. female with history of chronic diastolic heart failure, permanent atrial fibrillation, CKD 3, hypertension, hyperlipidemia, and obesity admitted with GI bleed and anasarca.  Assessment & Plan    Chronic HFpEF and pericardial effusion: Urine output somewhat better yesterday after metolazone.  Wt essentially unchanged since yesterday. -Continue furosemide infusion.  Could consider increasing if urine output declines. -Redose metolazone 2.5 mg x 1 today. -Continue midodrine, as BP remains soft. -Low threshold for R+/-LHC and/or transfer to [facility] if clinical status does not continue to improve with escalation of diuresis.  Permanent atrial fibrillation: Ventricular rates remain adequately controlled. -Decrease metoprolol tartrate to 12.5 mg BID (from Q6 hours) given persistently soft BP. -Continue apixaban.  Acute kidney injury superimposed on chronic kidney disease: Creatinine still improving gradually despite metolazone dose yesterday. -Redose metolazone 2.5 mg x 1 today. -Continue furosemide infusion. -Avoid nephrotoxic agents. -Further management per IM and nephrology.  Hypotension: BP still soft. -Continue midodrine. -Decrease metoprolol to 12.5 mg BID.  GI bleed: No further bleeding reported.  H/H stable. -Continue PPI. -Continue apixaban.    For questions or updates, please contact [place] Please consult [email or web address] for  contact info under [facility].     Signed, Nelva Bush, MD  08/21/2021, 7:44 AM

## 2021-08-22 DIAGNOSIS — N189 Chronic kidney disease, unspecified: Secondary | ICD-10-CM

## 2021-08-22 DIAGNOSIS — I5020 Unspecified systolic (congestive) heart failure: Secondary | ICD-10-CM

## 2021-08-22 DIAGNOSIS — N179 Acute kidney failure, unspecified: Secondary | ICD-10-CM

## 2021-08-22 DIAGNOSIS — I959 Hypotension, unspecified: Secondary | ICD-10-CM

## 2021-08-22 DIAGNOSIS — I3139 Other pericardial effusion (noninflammatory): Secondary | ICD-10-CM | POA: Diagnosis not present

## 2021-08-22 DIAGNOSIS — I4891 Unspecified atrial fibrillation: Secondary | ICD-10-CM | POA: Diagnosis not present

## 2021-08-22 DIAGNOSIS — I5033 Acute on chronic diastolic (congestive) heart failure: Secondary | ICD-10-CM | POA: Diagnosis not present

## 2021-08-22 LAB — COMPREHENSIVE METABOLIC PANEL
ALT: 15 U/L (ref 0–44)
AST: 22 U/L (ref 15–41)
Albumin: 3.6 g/dL (ref 3.5–5.0)
Alkaline Phosphatase: 78 U/L (ref 38–126)
Anion gap: 10 (ref 5–15)
BUN: 45 mg/dL — ABNORMAL HIGH (ref 8–23)
CO2: 36 mmol/L — ABNORMAL HIGH (ref 22–32)
Calcium: 9.6 mg/dL (ref 8.9–10.3)
Chloride: 93 mmol/L — ABNORMAL LOW (ref 98–111)
Creatinine, Ser: 1.7 mg/dL — ABNORMAL HIGH (ref 0.44–1.00)
GFR, Estimated: 31 mL/min — ABNORMAL LOW (ref >60)
Glucose, Bld: 172 mg/dL — ABNORMAL HIGH (ref 70–99)
Potassium: 3.5 mmol/L (ref 3.5–5.1)
Sodium: 139 mmol/L (ref 135–145)
Total Bilirubin: 2 mg/dL — ABNORMAL HIGH (ref 0.3–1.2)
Total Protein: 6.1 g/dL — ABNORMAL LOW (ref 6.5–8.1)

## 2021-08-22 LAB — CBC WITH DIFFERENTIAL/PLATELET
Abs Immature Granulocytes: 0.02 10*3/uL (ref 0.00–0.07)
Basophils Absolute: 0.1 10*3/uL (ref 0.0–0.1)
Basophils Relative: 1 %
Eosinophils Absolute: 0.2 10*3/uL (ref 0.0–0.5)
Eosinophils Relative: 4 %
HCT: 32.8 % — ABNORMAL LOW (ref 36.0–46.0)
Hemoglobin: 10.2 g/dL — ABNORMAL LOW (ref 12.0–15.0)
Immature Granulocytes: 1 %
Lymphocytes Relative: 13 %
Lymphs Abs: 0.6 10*3/uL — ABNORMAL LOW (ref 0.7–4.0)
MCH: 27.5 pg (ref 26.0–34.0)
MCHC: 31.1 g/dL (ref 30.0–36.0)
MCV: 88.4 fL (ref 80.0–100.0)
Monocytes Absolute: 0.6 10*3/uL (ref 0.1–1.0)
Monocytes Relative: 15 %
Neutro Abs: 2.7 10*3/uL (ref 1.7–7.7)
Neutrophils Relative %: 66 %
Platelets: 365 10*3/uL (ref 150–400)
RBC: 3.71 MIL/uL — ABNORMAL LOW (ref 3.87–5.11)
RDW: 25.1 % — ABNORMAL HIGH (ref 11.5–15.5)
Smear Review: NORMAL
WBC: 4.1 10*3/uL (ref 4.0–10.5)
nRBC: 0 % (ref 0.0–0.2)

## 2021-08-22 LAB — GLUCOSE, CAPILLARY
Glucose-Capillary: 147 mg/dL — ABNORMAL HIGH (ref 70–99)
Glucose-Capillary: 223 mg/dL — ABNORMAL HIGH (ref 70–99)
Glucose-Capillary: 174 mg/dL — ABNORMAL HIGH (ref 70–99)
Glucose-Capillary: 221 mg/dL — ABNORMAL HIGH (ref 70–99)

## 2021-08-22 LAB — PHOSPHORUS: Phosphorus: 3.1 mg/dL (ref 2.5–4.6)

## 2021-08-22 LAB — MAGNESIUM: Magnesium: 2.1 mg/dL (ref 1.7–2.4)

## 2021-08-22 MED ORDER — METOPROLOL SUCCINATE ER 25 MG PO TB24
12.5000 mg | ORAL_TABLET | Freq: Every day | ORAL | Status: DC
  Administered 2021-08-22 – 2021-08-28 (×7): 12.5 mg via ORAL
  Filled 2021-08-22 (×7): qty 1

## 2021-08-22 MED ORDER — METOLAZONE 2.5 MG PO TABS
2.5000 mg | ORAL_TABLET | Freq: Once | ORAL | Status: AC
  Administered 2021-08-22: 2.5 mg via ORAL
  Filled 2021-08-22: qty 1

## 2021-08-22 NOTE — Progress Notes (Signed)
Progress Note  Patient Name: Karen Farley Date of Encounter: 08/22/2021  [facility] Cardiologist: Kate Sable, MD   Subjective   UOP -5L . BPS still soft. Patient denies chest pain or SOB. Feels LLE is much improved. No JVD appreciated.   Inpatient Medications    Scheduled Meds:  apixaban  5 mg Oral BID   Chlorhexidine Gluconate Cloth  6 each Topical Daily   docusate sodium  100 mg Oral Daily   folic acid  1 mg Oral Daily   hydrocortisone   Rectal TID   insulin aspart  0-5 Units Subcutaneous QHS   insulin aspart  0-9 Units Subcutaneous TID WC   iron polysaccharides  150 mg Oral Daily   latanoprost  1 drop Both Eyes QHS   metoprolol tartrate  12.5 mg Oral BID   midodrine  10 mg Oral TID WC   pantoprazole (PROTONIX) IV  40 mg Intravenous Q12H   polyethylene glycol  17 g Oral Daily   potassium chloride  40 mEq Oral TID   Ensure Max Protein  11 oz Oral BID   spironolactone  25 mg Oral Daily   vitamin B-12  500 mcg Oral Daily   Continuous Infusions:  sodium chloride Stopped (08/18/21 0325)   furosemide (LASIX) 200 mg in dextrose 5% 100 mL (2mg /mL) infusion 8 mg/hr (08/21/21 2359)   PRN Meds: acetaminophen, bisacodyl, lidocaine HCl (PF), lip balm, traMADol   Vital Signs    Vitals:   08/22/21 0014 08/22/21 0446 08/22/21 0517 08/22/21 0720  BP: 101/64 (!) 89/59  (!) 89/55  Pulse: 86 85  90  Resp:  16  16  Temp:  99.1 F (37.3 C)  98.4 F (36.9 C)  TempSrc:  Oral  Oral  SpO2: 97% 99%  95%  Weight:   92.5 kg   Height:        Intake/Output Summary (Last 24 hours) at 08/22/2021 0800 Last data filed at 08/22/2021 9563 Gross per 24 hour  Intake 1920 ml  Output 4175 ml  Net -2255 ml   Last 3 Weights 08/22/2021 08/21/2021 08/21/2021  Weight (lbs) 203 lb 14.8 oz 205 lb 4.8 oz 208 lb 8.9 oz  Weight (kg) 92.5 kg 93.123 kg 94.6 kg      Telemetry    Afib, HR 80s - Personally Reviewed  ECG    No new - Personally Reviewed  Physical Exam   GEN: No  acute distress.   Neck: No JVD Cardiac: Irreg Irreg, no murmurs, rubs, or gallops.  Respiratory: Clear to auscultation bilaterally. GI: Soft, nontender, non-distended  MS: No edema; No deformity. Neuro:  Nonfocal  Psych: Normal affect   Labs    High Sensitivity Troponin:   Recent Labs  Lab 07/26/21 1708  TROPONINIHS 17     Chemistry Recent Labs  Lab 08/20/21 0650 08/21/21 0540 08/22/21 0610  NA 138 138 139  K 3.3* 3.8 3.5  CL 93* 96* 93*  CO2 35* 34* 36*  GLUCOSE 174* 178* 172*  BUN 45* 43* 45*  CREATININE 1.76* 1.67* 1.70*  CALCIUM 9.3 9.4 9.6  MG 2.2 2.1 2.1  PROT 6.1* 6.1* 6.1*  ALBUMIN 3.8 3.6 3.6  AST 19 20 22   ALT 16 14 15   ALKPHOS 71 77 78  BILITOT 2.3* 2.0* 2.0*  GFRNONAA 30* 32* 31*  ANIONGAP 10 8 10     Lipids No results for input(s): CHOL, TRIG, HDL, LABVLDL, LDLCALC, CHOLHDL in the last 168 hours.  Hematology Recent Labs  Lab 08/20/21 0650 08/21/21 0540 08/22/21 0610  WBC 5.7 4.5 4.1  RBC 3.38* 3.65* 3.71*  HGB 9.6* 9.9* 10.2*  HCT 30.1* 32.8* 32.8*  MCV 89.1 89.9 88.4  MCH 28.4 27.1 27.5  MCHC 31.9 30.2 31.1  RDW 25.7* 25.8* 25.1*  PLT 356 380 365   Thyroid No results for input(s): TSH, FREET4 in the last 168 hours.  BNPNo results for input(s): BNP, PROBNP in the last 168 hours.  DDimer No results for input(s): DDIMER in the last 168 hours.   Radiology    No results found.  Cardiac Studies   TEE limited 08/19/21  1. Left ventricular ejection fraction, by estimation, is 60 to 65%. The  left ventricle has normal function. The left ventricle has no regional  wall motion abnormalities. Left ventricular diastolic function could not  be evaluated.   2. Right ventricular systolic function is normal. The right ventricular  size is normal.   3. Large pericardial effusion slightly smaller than on the echo 08/15/21.  There is echogenic material in the pericardium consistent with thrombus.  No evidence of tamponade. Large pericardial  effusion. Moderate pleural  effusion in the left lateral  region.   4. The inferior vena cava is normal in size with greater than 50%  respiratory variability, suggesting right atrial pressure of 3 mmHg.   Patient Profile     75 y.o. female  with history of chronic diastolic heart failure, permanent atrial fibrillation, CKD 3, hypertension, hyperlipidemia, and obesity admitted with GI bleed and anasarca.    Assessment & Plan    HFpEF and pericardial effusion s/p pericardiocentesis 10/16 - Echo 10/30 showed large pericardiac effusion and she was started on Metolazone - UOP good at 5L, net -20L - continue IV lasix infusion at  8mg /hr - continue midodrine for soft Bps>>Wean as able - kidney function relatively stable, continue with diuresis - Continue low dose BB - may need transfer to [facility] if patient does not improve.  - consider repeat limited TEE to reassess pericardial effusion  Permanent Afib - metoprolol decreased to 12.5mg  BID for hypotension on midodrine - Continue Eliquis for stroke ppx  AKI/CKD  - Ace held - baseline Scr 1.66 - IV lasix infusion - nephrology following  Hypotension - BP soft on midodrine - metoprolol decreased to 12.5mg BID  GIB - Hgb 10.2 - continue Eliquis 5mg  BID  For questions or updates, please contact [facility] Please consult [email or web address] for contact info under        Signed, Ohn Bostic Ninfa Meeker, PA-C  08/22/2021, 8:00 AM

## 2021-08-22 NOTE — Progress Notes (Signed)
[place] [facility] [place] NAME: Dayra Rapley    MR#:  [id number]  DATE OF BIRTH:  1945/11/09  SUBJECTIVE:   patient seen earlier. Was at bedside commode. Overall doing well. Able to get up from the bed by herself. REVIEW OF SYSTEMS:   Review of Systems  Constitutional:  Negative for chills, fever and weight loss.  HENT:  Negative for ear discharge, ear pain and nosebleeds.   Eyes:  Negative for blurred vision, pain and discharge.  Respiratory:  Negative for sputum production, shortness of breath, wheezing and stridor.   Cardiovascular:  Positive for leg swelling. Negative for chest pain, palpitations, orthopnea and PND.  Gastrointestinal:  Negative for abdominal pain, diarrhea, nausea and vomiting.  Genitourinary:  Negative for frequency and urgency.  Musculoskeletal:  Negative for back pain and joint pain.  Neurological:  Positive for weakness. Negative for sensory change, speech change and focal weakness.  Psychiatric/Behavioral:  Negative for depression and hallucinations. The patient is not nervous/anxious.   Tolerating Diet: Tolerating PT:   DRUG ALLERGIES:  No Known Allergies  VITALS:  Blood pressure 104/66, pulse 90, temperature 98.8 F (37.1 C), temperature source Oral, resp. rate 18, height 5\' 2"  (1.575 m), weight 92.4 kg, SpO2 100 %.  PHYSICAL EXAMINATION:    GENERAL:  75 y.o.-year-old patient lying in the bed with no acute distress. obese HEENT: Head atraumatic, normocephalic. Oropharynx and nasopharynx clear.  NECK:  Supple, no jugular venous distention. No thyroid enlargement, no tenderness.  LUNGS: decreased breath sounds bilaterally, no wheezing, rales, rhonchi. No use of accessory muscles of respiration.  CARDIOVASCULAR: S1, S2 normal. No murmurs, rubs, or gallops.  ABDOMEN: Soft, nontender, nondistended. Bowel sounds present. No organomegaly or mass.  EXTREMITIES: Bilateral edema + NEUROLOGIC: non-focal PSYCHIATRIC:   patient is alert and oriented x 3.  SKIN: No obvious rash, lesion, or ulcer.   LABORATORY PANEL:  CBC Recent Labs  Lab 08/22/21 0610  WBC 4.1  HGB 10.2*  HCT 32.8*  PLT 365    Chemistries  Recent Labs  Lab 08/22/21 0610  NA 139  K 3.5  CL 93*  CO2 36*  GLUCOSE 172*  BUN 45*  CREATININE 1.70*  CALCIUM 9.6  MG 2.1  AST 22  ALT 15  ALKPHOS 78  BILITOT 2.0*   Cardiac Enzymes No results for input(s): TROPONINI in the last 168 hours. RADIOLOGY:  No results found. ASSESSMENT AND PLAN:  75 y.o. WF PMHx HFpEF, permanent atrial fibrillation on Eliquis, PAD, type 2 diabetes, CKD stage IIIb, lymphedema   Presented to hospital with increasing shortness of breath and edema.  Patient has been noticing increasing abdominal distention since her admission in August with severe lower extremity edema and dyspnea and exertion. GI bleed with hemorrhagic shock. --Patient was noted to have significant drop in hemoglobin to 9.7 from 12.4 and was transferred to the ICU.   --GI Dr Alice Reichert from 10/19-- May forego endoluminal evaluation for the near future given lack of bleeding recurrence and persistence of cardiopulmonary issues. Patient is in agreement. --10/23-- d/w Dr Russo--no Rectal bleed. Ok to resume po eliquis   Large pericardial effusion  -S/p percardiocentesis   Large Pleural effusion s/p thoracentesis Patient underwent a pericardiocentesis on 08/05/2021 with removal of 700 mL of pleural fluid.  Inflammatory markers were unremarkable.  Cardiology monitoring the patient while in the hospital.  Still on vasopressors due to hypotension. --repeat echo 10/21--no pericaridal effusion   Acute on chronic diastolic CHF -  Strict in and out    Severe Anasarca -status post thoracocentesis with removal of 600 mL of fluid on 07/28/2021. --10/20-- torsemide 20 mg qd with IV albumin --10/21-- IV lqasix 40 mg bid per cardiology --10/22 cont IV lasix--good uop --10/23--IV lasix --10/24-- Dr  Juleen China wants to start IV lasxi gtt --10/25-cont Lasix gtt -11/1 decrease Metoprolol 12.5 mg BID per cardiology -11/1 Zaroxolyn 2.5 mg x 1 --11/2--IV lasix gtt Negative 16 liters UOP, weight trending down     Hypotension suspect  -Secondary to hemorrhagic shock and low intravsacular volume --cont po  midodrine.   --IV Albumin       Acute kidney injury on CKD stage 3b(Baseline Cr 1.4-1.7) - Currently at baseline.   --Nephrology followed the patient during hospitalization.  -- Benazepril and spironolactone on hold.   Permanent atrial fibrillation --Was previously on Eliquis.  Currently on hold due to GI bleed.  -- Rate controlled at this time--not on any po rate controlling meds --10/21--started on IV heparin gtt--monitor hgb  --10/22-- hgb remains stable --10/23-- discussed with G.I. Dr. Virgina Jock and cardiology Dr. Rockey Situ plan is to move forward with starting eliquis while patient is in-house to make sure she tolerates. DC heparin drip. Patient is in agreement with plan. --hgb stable    Pulmonary HTN - See A. fib   DM type II with hyperglycemia Diet controlled at home.   -- Sensitive SSI   Iron deficiency/Vitamin B12  --patient received Venofer -cont oral iron and vitamin B12 supplements.            Procedures: 10/8 s/p left thoracentesis: 666ml aspirated 10/12 paracentesis 1.2L aspirated .  10/16 Echocardiogram performed: Pericardiocentesis 7107ml aspirated and drain placed  Failed Lasix IV advanced to Lasix gtt; however, Lasix and Eliquis stopped 10/15 and patient transferred to ICU after had episode of GI bleed with drop in hgb,  s/p 2U pRBC and vasopressor support initiated.   10/21 Echocardiogram LVEF= 60 to 65%.  -Pulmonary HTN: Assumed right atrial pressure of 15 mmHg, the  estimated right ventricular systolic pressure is 63.0 mmHg.  Pericardium: A small pericardial effusion is present.  10/27 LEFT thoracentesis: Aspirated 700 mL of clear yellow fluid was  removed.  DVT prophylaxis: Eliquis  Code Status: Full Family Communication: 11/2 husband at bedside for discussion plan of care all questions answered Status is: Inpatient       Dispo: The patient is from: Home              Anticipated d/c is to: Home              Anticipated d/c date is: > 3 days              Patient currently is not medically stable to d/c. Will await from Cardiology/nephrology as to when pt can discharge                 TOTAL TIME TAKING CARE OF THIS PATIENT: 25 minutes.  >50% time spent on counselling and coordination of care  Note: This dictation was prepared with Dragon dictation along with smaller phrase technology. Any transcriptional errors that result from this process are unintentional.  Fritzi Mandes M.D    [facility]   CC: Primary care physician; Valerie Roys, DO Patient ID: [name], female   DOB: 1946-04-02, 75 y.o.   MRN: [id number]

## 2021-08-22 NOTE — Progress Notes (Signed)
[facility]  ROUNDING NOTE   Subjective:   [name] was admitted to [facility] on 07/26/2021 for Morbid obesity ([place]) [E66.01] DOE (dyspnea on exertion) [R06.09] Recurrent left pleural effusion [J90] Hypotension [I95.9] Type 2 diabetes mellitus without complication, without long-term current use of insulin ([place]) [E11.9] Atrial fibrillation, unspecified type ([place]) [I48.91] Chronic congestive heart failure, unspecified heart failure type ([place]) [I50.9]  Patient sitting in chair, alert and oriented Poor appetite due to repetition of menu items Recorded urine output of greater then 5L in past 24 hours Creatinine stable Edema continues to improve   Objective:  Vital signs in last 24 hours:  Temp:  [97.9 F (36.6 C)-99.1 F (37.3 C)] 98.4 F (36.9 C) (11/02 0720) Pulse Rate:  [80-99] 90 (11/02 0720) Resp:  [16-19] 16 (11/02 0720) BP: (72-107)/(40-65) 89/55 (11/02 0720) SpO2:  [93 %-99 %] 95 % (11/02 0720) Weight:  [92.5 kg] 92.5 kg (11/02 0517)  Weight change: -1.476 kg Filed Weights   08/21/21 0400 08/21/21 0958 08/22/21 0517  Weight: 94.6 kg 93.1 kg 92.5 kg    Intake/Output: I/O last 3 completed shifts: In: 1920 [P.O.:1920] Out: 5975 [Urine:5975]   Intake/Output this shift:  Total I/O In: -  Out: 900 [Urine:900]  Physical Exam: General: In no acute distress, sitting in chair  Head: oral mucosal membranes moist  Lungs:  Clear bilaterally, normal effort, dry cough  Heart: S1S2, irregular  Abdomen:  Soft, Non tender  Extremities:  ++ peripheral edema. Anasarca +  Neurologic: Awake, alert, able to answer questions appropriately  Skin: No acute lesions or rashes        Basic Metabolic Panel: Recent Labs  Lab 08/18/21 0637 08/19/21 0635 08/20/21 0650 08/21/21 0540 08/22/21 0610  NA 137 139 138 138 139  K 3.1* 2.9* 3.3* 3.8 3.5  CL 93* 93* 93* 96* 93*  CO2 34* 36* 35* 34* 36*  GLUCOSE 161* 155* 174* 178* 172*  BUN 44* 41* 45* 43* 45*   CREATININE 1.85* 1.94* 1.76* 1.67* 1.70*  CALCIUM 9.4 9.2 9.3 9.4 9.6  MG 2.7* 2.3 2.2 2.1 2.1  PHOS 3.4 3.0 3.1 2.7 3.1     Liver Function Tests: Recent Labs  Lab 08/18/21 0637 08/19/21 0635 08/20/21 0650 08/21/21 0540 08/22/21 0610  AST 26 22 19 20 22   ALT 19 16 16 14 15   ALKPHOS 74 71 71 77 78  BILITOT 2.2* 2.5* 2.3* 2.0* 2.0*  PROT 6.1* 6.2* 6.1* 6.1* 6.1*  ALBUMIN 3.8 3.8 3.8 3.6 3.6    No results for input(s): LIPASE, AMYLASE in the last 168 hours. No results for input(s): AMMONIA in the last 168 hours.  CBC: Recent Labs  Lab 08/18/21 0637 08/19/21 0635 08/20/21 0650 08/21/21 0540 08/22/21 0610  WBC 5.6 6.2 5.7 4.5 4.1  NEUTROABS  --   --   --  3.2 2.7  HGB 9.7* 9.6* 9.6* 9.9* 10.2*  HCT 30.6* 29.6* 30.1* 32.8* 32.8*  MCV 89.0 88.9 89.1 89.9 88.4  PLT 341 351 356 380 365     Cardiac Enzymes: No results for input(s): CKTOTAL, CKMB, CKMBINDEX, TROPONINI in the last 168 hours.  BNP: Invalid input(s): POCBNP  CBG: Recent Labs  Lab 08/21/21 0734 08/21/21 1121 08/21/21 1612 08/21/21 2004 08/22/21 0722  GLUCAP 165* 219* 205* 179* 147*     Microbiology: Results for orders placed or performed during the hospital encounter of 07/26/21  Resp Panel by RT-PCR (Flu A&B, Covid) Nasopharyngeal Swab     Status: None  Collection Time: 07/26/21  7:19 PM   Specimen: Nasopharyngeal Swab; Nasopharyngeal(NP) swabs in vial transport medium  Result Value Ref Range Status   SARS Coronavirus 2 by RT PCR NEGATIVE NEGATIVE Final    Comment: (NOTE) SARS-CoV-2 target nucleic acids are NOT DETECTED.  The SARS-CoV-2 RNA is generally detectable in upper respiratory specimens during the acute phase of infection. The lowest concentration of SARS-CoV-2 viral copies this assay can detect is 138 copies/mL. A negative result does not preclude SARS-Cov-2 infection and should not be used as the sole basis for treatment or other patient management decisions. A negative  result may occur with  improper specimen collection/handling, submission of specimen other than nasopharyngeal swab, presence of viral mutation(s) within the areas targeted by this assay, and inadequate number of viral copies(<138 copies/mL). A negative result must be combined with clinical observations, patient history, and epidemiological information. The expected result is Negative.  Fact Sheet for Patients:  EntrepreneurPulse.com.au  Fact Sheet for Healthcare Providers:  IncredibleEmployment.be  This test is no t yet approved or cleared by the [place] FDA and  has been authorized for detection and/or diagnosis of SARS-CoV-2 by FDA under an Emergency Use Authorization (EUA). This EUA will remain  in effect (meaning this test can be used) for the duration of the COVID-19 declaration under Section 564(b)(1) of the Act, 21 U.S.C.section 360bbb-3(b)(1), unless the authorization is terminated  or revoked sooner.       Influenza A by PCR NEGATIVE NEGATIVE Final   Influenza B by PCR NEGATIVE NEGATIVE Final    Comment: (NOTE) The Xpert Xpress SARS-CoV-2/FLU/RSV plus assay is intended as an aid in the diagnosis of influenza from Nasopharyngeal swab specimens and should not be used as a sole basis for treatment. Nasal washings and aspirates are unacceptable for Xpert Xpress SARS-CoV-2/FLU/RSV testing.  Fact Sheet for Patients: EntrepreneurPulse.com.au  Fact Sheet for Healthcare Providers: IncredibleEmployment.be  This test is not yet approved or cleared by the [place] FDA and has been authorized for detection and/or diagnosis of SARS-CoV-2 by FDA under an Emergency Use Authorization (EUA). This EUA will remain in effect (meaning this test can be used) for the duration of the COVID-19 declaration under Section 564(b)(1) of the Act, 21 U.S.C. section 360bbb-3(b)(1), unless the authorization is  terminated or revoked.  Performed at [facility], [address] [place], [place] [address]   Body fluid culture w Gram Stain     Status: None   Collection Time: 07/28/21  1:53 PM   Specimen: PATH Cytology Pleural fluid  Result Value Ref Range Status   Specimen Description   Final    PLEURAL Performed at [facility], [address], [place], [place] [address]    Special Requests   Final    NONE Performed at [facility], [address] [place], [place] [address]    Gram Stain   Final    NO SQUAMOUS EPITHELIAL CELLS SEEN FEW WBC SEEN NO ORGANISMS SEEN    Culture   Final    NO GROWTH 3 DAYS Performed at [facility], [place] [address], [place], [place] [address]    Report Status 08/01/2021 FINAL  Final  Body fluid culture w Gram Stain     Status: None   Collection Time: 08/01/21  3:36 PM   Specimen: PATH Cytology Peritoneal fluid  Result Value Ref Range Status   Specimen Description   Final    PERITONEAL Performed at [facility], [address] [place], [place] [address]    Special Requests   Final    NONE Performed at [facility], [place], [place] [address]    Gram Stain   Final    RARE WBC PRESENT,BOTH PMN AND MONONUCLEAR NO ORGANISMS SEEN    Culture   Final    NO GROWTH 3 DAYS Performed at [facility], [place] [address] [place], [place] [address]    Report Status 08/05/2021 FINAL  Final  MRSA Next Gen by PCR, Nasal     Status: None   Collection Time: 08/04/21  8:56 PM   Specimen: Nasal Mucosa; Nasal Swab  Result Value Ref Range Status   MRSA by PCR Next Gen NOT DETECTED NOT DETECTED Final    Comment: (NOTE) The GeneXpert MRSA Assay (FDA approved for NASAL specimens only), is one component of a comprehensive MRSA colonization surveillance program. It is not intended to diagnose MRSA infection nor to guide or monitor treatment for MRSA infections. Test performance is not FDA  approved in patients less than 72 years old. Performed at [facility], [address] [place], [place] [address]   Body fluid culture w Gram Stain     Status: None   Collection Time: 08/05/21  5:50 PM   Specimen: Pericardial  Result Value Ref Range Status   Specimen Description   Final    PERICARDIAL Performed at [facility], [address], [place], [place] [address]    Special Requests   Final    NONE Performed at [facility], [address] [place], [place] [address]    Gram Stain   Final    FEW WBC PRESENT, PREDOMINANTLY MONONUCLEAR NO ORGANISMS SEEN    Culture   Final    NO GROWTH 3 DAYS Performed at [facility], [place] [address] [place], [place] [address]    Report Status 08/09/2021 FINAL  Final  Fungus Culture With Stain     Status: None (Preliminary result)   Collection Time: 08/16/21 11:46 AM   Specimen: PATH Cytology Pleural fluid  Result Value Ref Range Status   Fungus Stain Final report  Final    Comment: (NOTE) Performed At: [facility] [place], [place] [id number] Rush Farmer MD [id number]    Fungus (Mycology) Culture PENDING  Incomplete   Fungal Source PLEURAL  Final    Comment: Performed at [facility], [address] [place], [place] [address]  Acid Fast Smear (AFB)     Status: None   Collection Time: 08/16/21 11:46 AM   Specimen: PATH Cytology Pleural fluid  Result Value Ref Range Status   AFB Specimen Processing Concentration  Final   Acid Fast Smear Negative  Final    Comment: (NOTE) Performed At: [facility] [place], [place] [id number] Rush Farmer MD [id number]    Source (AFB) PLEURAL  Final    Comment: Performed at [facility], [address] [place], [place] [address]  Body fluid culture w Gram Stain     Status: None   Collection Time: 08/16/21 11:46 AM   Specimen: PATH Cytology Pleural fluid  Result Value Ref Range  Status   Specimen Description   Final    PLEURAL Performed at [facility], [address], [place], [place] [address]    Special Requests   Final    NONE Performed at [facility], [address], [place],  [address]  Gram Stain   Final    WBC PRESENT, PREDOMINANTLY MONONUCLEAR NO ORGANISMS SEEN CYTOSPIN SMEAR    Culture   Final    NO GROWTH 3 DAYS Performed at [facility], [address], [place], [place] [address]    Report Status 08/19/2021 FINAL  Final  Fungus Culture Result     Status: None   Collection Time: 08/16/21 11:46 AM  Result Value Ref Range Status   Result 1 Comment  Final    Comment: (NOTE) KOH/Calcofluor preparation:  no fungus observed. Performed At: [facility] [place], [place] [id number] Rush Farmer MD [id number]     Coagulation Studies: No results for input(s): LABPROT, INR in the last 72 hours.   Urinalysis: No results for input(s): COLORURINE, LABSPEC, PHURINE, GLUCOSEU, HGBUR, BILIRUBINUR, KETONESUR, PROTEINUR, UROBILINOGEN, NITRITE, LEUKOCYTESUR in the last 72 hours.  Invalid input(s): APPERANCEUR    Imaging: No results found.   Medications:    sodium chloride Stopped (08/18/21 0325)   furosemide (LASIX) 200 mg in dextrose 5% 100 mL (2mg /mL) infusion 8 mg/hr (08/21/21 2359)    apixaban  5 mg Oral BID   Chlorhexidine Gluconate Cloth  6 each Topical Daily   docusate sodium  100 mg Oral Daily   folic acid  1 mg Oral Daily   hydrocortisone   Rectal TID   insulin aspart  0-5 Units Subcutaneous QHS   insulin aspart  0-9 Units Subcutaneous TID WC   iron polysaccharides  150 mg Oral Daily   latanoprost  1 drop Both Eyes QHS   metoprolol succinate  12.5 mg Oral Daily   midodrine  10 mg Oral TID WC   pantoprazole (PROTONIX) IV  40 mg Intravenous Q12H   polyethylene glycol  17 g Oral Daily   potassium chloride  40 mEq Oral TID   Ensure Max Protein  11 oz Oral BID    spironolactone  25 mg Oral Daily   vitamin B-12  500 mcg Oral Daily     Assessment/ Plan:  [name] is a 75 y.o. white female with diastolic congestive heart failure, hypertension, hyperlipidemia, lymphedema, atrial fibrillation, peripheral vascular disease who is admitted to [facility] on 07/26/2021 for Morbid obesity ([place]) [E66.01] DOE (dyspnea on exertion) [R06.09] Recurrent left pleural effusion [J90] Hypotension [I95.9] Type 2 diabetes mellitus without complication, without long-term current use of insulin ([place]) [E11.9] Atrial fibrillation, unspecified type ([place]) [I48.91] Chronic congestive heart failure, unspecified heart failure type ([place]) [I50.9]  #Acute kidney injury on chronic kidney disease stage IIIB: with baseline creatinine of 1.66, GFR of 36 on 07/13/21. History of bland urine. 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 Hold benazepril. Creatinine remains stable. Recorded urine output of 5L in preceding 24 hours. Metolazone prescribed yesterday. Weight appears stable today, but was obtained later in morning.  Will continue Furosemide infusion 8 mg/hr and spironolactone.   #Hypotension Blood Pressure remains soft 89/55  Midodrine 10 mg 3 times a day  #Acute exacerbation of diastolic congestive heart failure:  ECHO performed on 08/19/21 shows large pericardial effusion Cardiology team following and monitoring. ECHO planned for later this week    LOS: [place] 11/2/202210:25 AM

## 2021-08-23 ENCOUNTER — Inpatient Hospital Stay (HOSPITAL_COMMUNITY)
Admit: 2021-08-23 | Discharge: 2021-08-23 | Disposition: A | Discharge status: Home / Self Care | Payer: Medicare PPO | Attending: Internal Medicine | Admitting: Internal Medicine

## 2021-08-23 DIAGNOSIS — D62 Acute posthemorrhagic anemia: Secondary | ICD-10-CM | POA: Diagnosis not present

## 2021-08-23 DIAGNOSIS — R0602 Shortness of breath: Secondary | ICD-10-CM

## 2021-08-23 DIAGNOSIS — I5033 Acute on chronic diastolic (congestive) heart failure: Secondary | ICD-10-CM | POA: Diagnosis not present

## 2021-08-23 DIAGNOSIS — J9601 Acute respiratory failure with hypoxia: Secondary | ICD-10-CM | POA: Diagnosis not present

## 2021-08-23 DIAGNOSIS — I3139 Other pericardial effusion (noninflammatory): Secondary | ICD-10-CM

## 2021-08-23 DIAGNOSIS — R0609 Other forms of dyspnea: Secondary | ICD-10-CM | POA: Diagnosis not present

## 2021-08-23 DIAGNOSIS — I340 Nonrheumatic mitral (valve) insufficiency: Secondary | ICD-10-CM

## 2021-08-23 LAB — CBC WITH DIFFERENTIAL/PLATELET
Abs Immature Granulocytes: 0.01 10*3/uL (ref 0.00–0.07)
Basophils Absolute: 0.1 10*3/uL (ref 0.0–0.1)
Basophils Relative: 1 %
Eosinophils Absolute: 0.1 10*3/uL (ref 0.0–0.5)
Eosinophils Relative: 3 %
HCT: 33.9 % — ABNORMAL LOW (ref 36.0–46.0)
Hemoglobin: 10.4 g/dL — ABNORMAL LOW (ref 12.0–15.0)
Immature Granulocytes: 0 %
Lymphocytes Relative: 13 %
Lymphs Abs: 0.5 10*3/uL — ABNORMAL LOW (ref 0.7–4.0)
MCH: 27.4 pg (ref 26.0–34.0)
MCHC: 30.7 g/dL (ref 30.0–36.0)
MCV: 89.2 fL (ref 80.0–100.0)
Monocytes Absolute: 0.7 10*3/uL (ref 0.1–1.0)
Monocytes Relative: 17 %
Neutro Abs: 2.7 10*3/uL (ref 1.7–7.7)
Neutrophils Relative %: 66 %
Platelets: 348 10*3/uL (ref 150–400)
RBC: 3.8 MIL/uL — ABNORMAL LOW (ref 3.87–5.11)
RDW: 24.5 % — ABNORMAL HIGH (ref 11.5–15.5)
WBC: 4 10*3/uL (ref 4.0–10.5)
nRBC: 0 % (ref 0.0–0.2)

## 2021-08-23 LAB — COMPREHENSIVE METABOLIC PANEL
ALT: 15 U/L (ref 0–44)
AST: 23 U/L (ref 15–41)
Albumin: 3.7 g/dL (ref 3.5–5.0)
Alkaline Phosphatase: 84 U/L (ref 38–126)
Anion gap: 10 (ref 5–15)
BUN: 49 mg/dL — ABNORMAL HIGH (ref 8–23)
CO2: 37 mmol/L — ABNORMAL HIGH (ref 22–32)
Calcium: 9.6 mg/dL (ref 8.9–10.3)
Chloride: 90 mmol/L — ABNORMAL LOW (ref 98–111)
Creatinine, Ser: 1.67 mg/dL — ABNORMAL HIGH (ref 0.44–1.00)
GFR, Estimated: 32 mL/min — ABNORMAL LOW (ref >60)
Glucose, Bld: 167 mg/dL — ABNORMAL HIGH (ref 70–99)
Potassium: 3.4 mmol/L — ABNORMAL LOW (ref 3.5–5.1)
Sodium: 137 mmol/L (ref 135–145)
Total Bilirubin: 1.9 mg/dL — ABNORMAL HIGH (ref 0.3–1.2)
Total Protein: 6.5 g/dL (ref 6.5–8.1)

## 2021-08-23 LAB — GLUCOSE, CAPILLARY
Glucose-Capillary: 169 mg/dL — ABNORMAL HIGH (ref 70–99)
Glucose-Capillary: 239 mg/dL — ABNORMAL HIGH (ref 70–99)
Glucose-Capillary: 250 mg/dL — ABNORMAL HIGH (ref 70–99)
Glucose-Capillary: 235 mg/dL — ABNORMAL HIGH (ref 70–99)

## 2021-08-23 LAB — ECHOCARDIOGRAM LIMITED
Area-P 1/2: 6.71 cm2
Height: 62 in
S' Lateral: 1.9 cm
Weight: 3027.2 oz

## 2021-08-23 LAB — PHOSPHORUS: Phosphorus: 3.8 mg/dL (ref 2.5–4.6)

## 2021-08-23 LAB — MAGNESIUM: Magnesium: 2 mg/dL (ref 1.7–2.4)

## 2021-08-23 NOTE — Progress Notes (Addendum)
[facility]  ROUNDING NOTE   Subjective:   [name] was admitted to [facility] on 07/26/2021 for Morbid obesity ([place]) [E66.01] DOE (dyspnea on exertion) [R06.09] Recurrent left pleural effusion [J90] Hypotension [I95.9] Type 2 diabetes mellitus without complication, without long-term current use of insulin ([place]) [E11.9] Atrial fibrillation, unspecified type ([place]) [I48.91] Chronic congestive heart failure, unspecified heart failure type ([place]) [I50.9]  Patient seated in recliner. Just finished walking with physical therapy, tolerated well. Denies shortness of breath with activity. Complains of pain in knees. Lower extremity and abdominal edema improved. Creatinine stable. Standing weight indicates a 14lb weight loss since yesterday.    Objective:  Vital signs in last 24 hours:  Temp:  [98.2 F (36.8 C)-98.8 F (37.1 C)] 98.2 F (36.8 C) (11/03 0733) Pulse Rate:  [84-102] 93 (11/03 0733) Resp:  [18-20] 18 (11/03 0733) BP: (96-122)/(59-70) 107/59 (11/03 0733) SpO2:  [94 %-100 %] 94 % (11/03 0733) Weight:  [85.8 kg-92.4 kg] 85.8 kg (11/03 0500)  Weight change: -0.723 kg Filed Weights   08/22/21 0517 08/22/21 1100 08/23/21 0500  Weight: 92.5 kg 92.4 kg 85.8 kg    Intake/Output: I/O last 3 completed shifts: In: 240 [P.O.:240] Out: 4425 [Urine:4425]   Intake/Output this shift:  Total I/O In: 240 [P.O.:240] Out: 800 [Urine:800]  Physical Exam: General: In no acute distress, sitting in chair  Head: oral mucosal membranes moist  Lungs:  Clear bilaterally, normal effort, dry cough  Heart: S1S2, irregular  Abdomen:  Soft, Non tender  Extremities:  + peripheral edema. Anasarca +  Neurologic: Awake, alert, able to answer questions appropriately  Skin: No acute lesions or rashes        Basic Metabolic Panel: Recent Labs  Lab 08/19/21 0635 08/20/21 0650 08/21/21 0540 08/22/21 0610 08/23/21 0800  NA 139 138 138 139 137  K 2.9* 3.3* 3.8 3.5 3.4*  CL  93* 93* 96* 93* 90*  CO2 36* 35* 34* 36* 37*  GLUCOSE 155* 174* 178* 172* 167*  BUN 41* 45* 43* 45* 49*  CREATININE 1.94* 1.76* 1.67* 1.70* 1.67*  CALCIUM 9.2 9.3 9.4 9.6 9.6  MG 2.3 2.2 2.1 2.1 2.0  PHOS 3.0 3.1 2.7 3.1 3.8     Liver Function Tests: Recent Labs  Lab 08/19/21 0635 08/20/21 0650 08/21/21 0540 08/22/21 0610 08/23/21 0800  AST 22 19 20 22 23   ALT 16 16 14 15 15   ALKPHOS 71 71 77 78 84  BILITOT 2.5* 2.3* 2.0* 2.0* 1.9*  PROT 6.2* 6.1* 6.1* 6.1* 6.5  ALBUMIN 3.8 3.8 3.6 3.6 3.7    No results for input(s): LIPASE, AMYLASE in the last 168 hours. No results for input(s): AMMONIA in the last 168 hours.  CBC: Recent Labs  Lab 08/19/21 0635 08/20/21 0650 08/21/21 0540 08/22/21 0610 08/23/21 0800  WBC 6.2 5.7 4.5 4.1 4.0  NEUTROABS  --   --  3.2 2.7 2.7  HGB 9.6* 9.6* 9.9* 10.2* 10.4*  HCT 29.6* 30.1* 32.8* 32.8* 33.9*  MCV 88.9 89.1 89.9 88.4 89.2  PLT 351 356 380 365 348     Cardiac Enzymes: No results for input(s): CKTOTAL, CKMB, CKMBINDEX, TROPONINI in the last 168 hours.  BNP: Invalid input(s): POCBNP  CBG: Recent Labs  Lab 08/22/21 0722 08/22/21 1113 08/22/21 1624 08/22/21 2027 08/23/21 0735  GLUCAP 147* 223* 174* 221* 169*     Microbiology: Results for orders placed or performed during the hospital encounter of 07/26/21  Resp Panel by RT-PCR (Flu A&B, Covid)  Nasopharyngeal Swab     Status: None   Collection Time: 07/26/21  7:19 PM   Specimen: Nasopharyngeal Swab; Nasopharyngeal(NP) swabs in vial transport medium  Result Value Ref Range Status   SARS Coronavirus 2 by RT PCR NEGATIVE NEGATIVE Final    Comment: (NOTE) SARS-CoV-2 target nucleic acids are NOT DETECTED.  The SARS-CoV-2 RNA is generally detectable in upper respiratory specimens during the acute phase of infection. The lowest concentration of SARS-CoV-2 viral copies this assay can detect is 138 copies/mL. A negative result does not preclude SARS-Cov-2 infection and  should not be used as the sole basis for treatment or other patient management decisions. A negative result may occur with  improper specimen collection/handling, submission of specimen other than nasopharyngeal swab, presence of viral mutation(s) within the areas targeted by this assay, and inadequate number of viral copies(<138 copies/mL). A negative result must be combined with clinical observations, patient history, and epidemiological information. The expected result is Negative.  Fact Sheet for Patients:  EntrepreneurPulse.com.au  Fact Sheet for Healthcare Providers:  IncredibleEmployment.be  This test is no t yet approved or cleared by the [place] FDA and  has been authorized for detection and/or diagnosis of SARS-CoV-2 by FDA under an Emergency Use Authorization (EUA). This EUA will remain  in effect (meaning this test can be used) for the duration of the COVID-19 declaration under Section 564(b)(1) of the Act, 21 U.S.C.section 360bbb-3(b)(1), unless the authorization is terminated  or revoked sooner.       Influenza A by PCR NEGATIVE NEGATIVE Final   Influenza B by PCR NEGATIVE NEGATIVE Final    Comment: (NOTE) The Xpert Xpress SARS-CoV-2/FLU/RSV plus assay is intended as an aid in the diagnosis of influenza from Nasopharyngeal swab specimens and should not be used as a sole basis for treatment. Nasal washings and aspirates are unacceptable for Xpert Xpress SARS-CoV-2/FLU/RSV testing.  Fact Sheet for Patients: EntrepreneurPulse.com.au  Fact Sheet for Healthcare Providers: IncredibleEmployment.be  This test is not yet approved or cleared by the [place] FDA and has been authorized for detection and/or diagnosis of SARS-CoV-2 by FDA under an Emergency Use Authorization (EUA). This EUA will remain in effect (meaning this test can be used) for the duration of the COVID-19 declaration  under Section 564(b)(1) of the Act, 21 U.S.C. section 360bbb-3(b)(1), unless the authorization is terminated or revoked.  Performed at [facility], [address] [place], [place] [address]   Body fluid culture w Gram Stain     Status: None   Collection Time: 07/28/21  1:53 PM   Specimen: PATH Cytology Pleural fluid  Result Value Ref Range Status   Specimen Description   Final    PLEURAL Performed at [facility], [address], [place], [place] [address]    Special Requests   Final    NONE Performed at [facility], [address] [place], [place] [address]    Gram Stain   Final    NO SQUAMOUS EPITHELIAL CELLS SEEN FEW WBC SEEN NO ORGANISMS SEEN    Culture   Final    NO GROWTH 3 DAYS Performed at [facility], [place] [address], [place], [place] [address]    Report Status 08/01/2021 FINAL  Final  Body fluid culture w Gram Stain     Status: None   Collection Time: 08/01/21  3:36 PM   Specimen: PATH Cytology Peritoneal fluid  Result Value Ref Range Status   Specimen Description   Final  PERITONEAL Performed at [facility], [address] [place], [place] [address]    Special Requests   Final    NONE Performed at [facility], [place], [place] [address]    Gram Stain   Final    RARE WBC PRESENT,BOTH PMN AND MONONUCLEAR NO ORGANISMS SEEN    Culture   Final    NO GROWTH 3 DAYS Performed at [facility], [place] [address] [place], [place] [address]    Report Status 08/05/2021 FINAL  Final  MRSA Next Gen by PCR, Nasal     Status: None   Collection Time: 08/04/21  8:56 PM   Specimen: Nasal Mucosa; Nasal Swab  Result Value Ref Range Status   MRSA by PCR Next Gen NOT DETECTED NOT DETECTED Final    Comment: (NOTE) The GeneXpert MRSA Assay (FDA approved for NASAL specimens only), is one component of a comprehensive MRSA colonization surveillance program. It is not intended to diagnose MRSA  infection nor to guide or monitor treatment for MRSA infections. Test performance is not FDA approved in patients less than 41 years old. Performed at [facility], [address] [place], [place] [address]   Body fluid culture w Gram Stain     Status: None   Collection Time: 08/05/21  5:50 PM   Specimen: Pericardial  Result Value Ref Range Status   Specimen Description   Final    PERICARDIAL Performed at [facility], [address], [place], [place] [address]    Special Requests   Final    NONE Performed at [facility], [address] [place], [place] [address]    Gram Stain   Final    FEW WBC PRESENT, PREDOMINANTLY MONONUCLEAR NO ORGANISMS SEEN    Culture   Final    NO GROWTH 3 DAYS Performed at [facility], [place] [address] [place], [place] [address]    Report Status 08/09/2021 FINAL  Final  Fungus Culture With Stain     Status: None (Preliminary result)   Collection Time: 08/16/21 11:46 AM   Specimen: PATH Cytology Pleural fluid  Result Value Ref Range Status   Fungus Stain Final report  Final    Comment: (NOTE) Performed At: [facility] [place], [place] [id number] Rush Farmer MD OE:[id number]    Fungus (Mycology) Culture PENDING  Incomplete   Fungal Source PLEURAL  Final    Comment: Performed at [facility], [address] [place], [place] [address]  Acid Fast Smear (AFB)     Status: None   Collection Time: 08/16/21 11:46 AM   Specimen: PATH Cytology Pleural fluid  Result Value Ref Range Status   AFB Specimen Processing Concentration  Final   Acid Fast Smear Negative  Final    Comment: (NOTE) Performed At: [facility] [place], [place] [id number] Rush Farmer MD YB:[id number]    Source (AFB) PLEURAL  Final    Comment: Performed at [facility], [address] [place], [place] [address]  Body fluid culture w Gram Stain     Status: None    Collection Time: 08/16/21 11:46 AM   Specimen: PATH Cytology Pleural fluid  Result Value Ref Range Status   Specimen Description   Final    PLEURAL Performed at [facility], [address] [place], [place] [address]    Special Requests   Final    NONE Performed at [facility], [phone] [place], [place] [address]    Gram Stain   Final    WBC PRESENT, PREDOMINANTLY MONONUCLEAR NO ORGANISMS SEEN CYTOSPIN SMEAR    Culture   Final    NO GROWTH 3 DAYS Performed at [place] [address] [place], [place] [address]    Report Status 08/19/2021 FINAL  Final  Fungus Culture Result     Status: None   Collection Time: 08/16/21 11:46 AM  Result Value Ref Range Status   Result 1 Comment  Final    Comment: (NOTE) KOH/Calcofluor preparation:  no fungus observed. Performed At: [facility] [place], [place] [id number] Rush Farmer MD ER:[id number]     Coagulation Studies: No results for input(s): LABPROT, INR in the last 72 hours.   Urinalysis: No results for input(s): COLORURINE, LABSPEC, PHURINE, GLUCOSEU, HGBUR, BILIRUBINUR, KETONESUR, PROTEINUR, UROBILINOGEN, NITRITE, LEUKOCYTESUR in the last 72 hours.  Invalid input(s): APPERANCEUR    Imaging: No results found.   Medications:    sodium chloride Stopped (08/18/21 0325)   furosemide (LASIX) 200 mg in dextrose 5% 100 mL (2mg /mL) infusion 8 mg/hr (08/23/21 0402)    apixaban  5 mg Oral BID   Chlorhexidine Gluconate Cloth  6 each Topical Daily   docusate sodium  100 mg Oral Daily   folic acid  1 mg Oral Daily   hydrocortisone   Rectal TID   insulin aspart  0-5 Units Subcutaneous QHS   insulin aspart  0-9 Units Subcutaneous TID WC   iron polysaccharides  150 mg Oral Daily   latanoprost  1 drop Both Eyes QHS   metoprolol succinate  12.5 mg Oral Daily   midodrine  10 mg Oral TID WC   pantoprazole (PROTONIX) IV  40 mg Intravenous Q12H   polyethylene glycol  17  g Oral Daily   potassium chloride  40 mEq Oral TID   Ensure Max Protein  11 oz Oral BID   spironolactone  25 mg Oral Daily   vitamin B-12  500 mcg Oral Daily     Assessment/ Plan:  [name] is a 75 y.o. white female with diastolic congestive heart failure, hypertension, hyperlipidemia, lymphedema, atrial fibrillation, peripheral vascular disease who is admitted to [facility] on 07/26/2021 for Morbid obesity ([place]) [E66.01] DOE (dyspnea on exertion) [R06.09] Recurrent left pleural effusion [J90] Hypotension [I95.9] Type 2 diabetes mellitus without complication, without long-term current use of insulin ([place]) [E11.9] Atrial fibrillation, unspecified type ([place]) [I48.91] Chronic congestive heart failure, unspecified heart failure type ([place]) [I50.9]  #Acute kidney injury on chronic kidney disease stage IIIB: with baseline creatinine of 1.66, GFR of 36 on 07/13/21. History of bland urine. 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 Hold benazepril. Creatinine remains stable. Adequate urine output of 2.6L over the past 24 hours. Continue to monitor daily standing weight. Will transition to po diuretics tomorrow.  Will continue Furosemide infusion 8 mg/hr and spironolactone.   #Hypotension Blood Pressure remains soft 107/59 Midodrine 10 mg 3 times a day  #Acute exacerbation of diastolic congestive heart failure:  ECHO performed on 08/19/21 shows large pericardial effusion Cardiology team following and monitoring. Will obtain ECHO to evaluate pericardial effusion.     LOS: [place] 11/3/202210:19 AM

## 2021-08-23 NOTE — Progress Notes (Signed)
[place] [facility] [place] NAME: Karen Farley    MR#:  [id number]  DATE OF BIRTH:  1946/10/13  SUBJECTIVE:   Sitting in the chair.Overall doing well. Able to get up from the bed by herself. Ambulated with tech in the room husband at bedside REVIEW OF SYSTEMS:   Review of Systems  Constitutional:  Negative for chills, fever and weight loss.  HENT:  Negative for ear discharge, ear pain and nosebleeds.   Eyes:  Negative for blurred vision, pain and discharge.  Respiratory:  Negative for sputum production, shortness of breath, wheezing and stridor.   Cardiovascular:  Positive for leg swelling. Negative for chest pain, palpitations, orthopnea and PND.  Gastrointestinal:  Negative for abdominal pain, diarrhea, nausea and vomiting.  Genitourinary:  Negative for frequency and urgency.  Musculoskeletal:  Negative for back pain and joint pain.  Neurological:  Positive for weakness. Negative for sensory change, speech change and focal weakness.  Psychiatric/Behavioral:  Negative for depression and hallucinations. The patient is not nervous/anxious.   Tolerating Diet: Tolerating PT: HHPT  DRUG ALLERGIES:  No Known Allergies  VITALS:  Blood pressure (!) 107/59, pulse 93, temperature 98.2 F (36.8 C), resp. rate 18, height 5\' 2"  (1.575 m), weight 85.8 kg, SpO2 94 %.  PHYSICAL EXAMINATION:    GENERAL:  75 y.o.-year-old patient lying in the bed with no acute distress. obese HEENT: Head atraumatic, normocephalic. Oropharynx and nasopharynx clear.  NECK:  Supple, no jugular venous distention. No thyroid enlargement, no tenderness.  LUNGS: decreased breath sounds bilaterally, no wheezing, rales, rhonchi. No use of accessory muscles of respiration.  CARDIOVASCULAR: S1, S2 normal. No murmurs, rubs, or gallops.  ABDOMEN: Soft, nontender, nondistended. Bowel sounds present. No organomegaly or mass.  EXTREMITIES: Bilateral edema + NEUROLOGIC:  non-focal PSYCHIATRIC:  patient is alert and oriented x 3.  SKIN: No obvious rash, lesion, or ulcer.   LABORATORY PANEL:  CBC Recent Labs  Lab 08/23/21 0800  WBC 4.0  HGB 10.4*  HCT 33.9*  PLT 348     Chemistries  Recent Labs  Lab 08/23/21 0800  NA 137  K 3.4*  CL 90*  CO2 37*  GLUCOSE 167*  BUN 49*  CREATININE 1.67*  CALCIUM 9.6  MG 2.0  AST 23  ALT 15  ALKPHOS 84  BILITOT 1.9*    Cardiac Enzymes No results for input(s): TROPONINI in the last 168 hours. RADIOLOGY:  No results found. ASSESSMENT AND PLAN:  75 y.o. WF PMHx HFpEF, permanent atrial fibrillation on Eliquis, PAD, type 2 diabetes, CKD stage IIIb, lymphedema   Presented to hospital with increasing shortness of breath and edema.  Patient has been noticing increasing abdominal distention since her admission in August with severe lower extremity edema and dyspnea and exertion. GI bleed with hemorrhagic shock. --Patient was noted to have significant drop in hemoglobin to 9.7 from 12.4 and was transferred to the ICU.   --GI Dr Alice Reichert from 10/19-- May forego endoluminal evaluation for the near future given lack of bleeding recurrence and persistence of cardiopulmonary issues. Patient is in agreement. --10/23-- d/w Dr Russo--no Rectal bleed. Ok to resume po eliquis   Large pericardial effusion  -S/p percardiocentesis   Large Pleural effusion s/p thoracentesis Patient underwent a pericardiocentesis on 08/05/2021 with removal of 700 mL of pleural fluid.  Inflammatory markers were unremarkable.  Cardiology monitoring the patient while in the hospital.  Still on vasopressors due to hypotension. --repeat echo 10/21--no pericaridal effusion  Acute on chronic diastolic CHF -Strict in and out    Severe Anasarca -status post thoracocentesis with removal of 600 mL of fluid on 07/28/2021. --10/20-- torsemide 20 mg qd with IV albumin --10/21-- IV lqasix 40 mg bid per cardiology --10/22 cont IV lasix--good  uop --10/23--IV lasix --10/24-- Dr Juleen China wants to start IV lasxi gtt --10/25-cont Lasix gtt -11/1 decrease Metoprolol 12.5 mg BID per cardiology -11/1 Zaroxolyn 2.5 mg x 1 --11/2--IV lasix gtt + zaroxylyn total17 liters UOP, weight trending down --11/3--cont IV lasix gtt. Get Echo to see status of pericardial effusion     Hypotension suspect  -Secondary to hemorrhagic shock and low intravsacular volume --cont po  midodrine.       Acute kidney injury on CKD stage 3b(Baseline Cr 1.4-1.7) - Currently at baseline.   --Nephrology followed the patient during hospitalization.  -- Benazepril and spironolactone on hold.   Permanent atrial fibrillation --Was previously on Eliquis.  Currently on hold due to GI bleed.  -- Rate controlled at this time--not on any po rate controlling meds --10/21--started on IV heparin gtt--monitor hgb  --10/22-- hgb remains stable --10/23-- discussed with G.I. Dr. [place]gina Jock and cardiology Dr. Rockey Situ plan is to move forward with starting eliquis while patient is in-house to make sure she tolerates. DC heparin drip. Patient is in agreement with plan. --hgb stable   Pulmonary HTN - See A. fib   DM type II with hyperglycemia Diet controlled at home.   -- Sensitive SSI   Iron deficiency/Vitamin B12  --patient received Venofer -cont oral iron and vitamin B12 supplements.            Procedures: 10/8 s/p left thoracentesis: 675ml aspirated 10/12 paracentesis 1.2L aspirated .  10/16 Echocardiogram performed: Pericardiocentesis 735ml aspirated and drain placed  Failed Lasix IV advanced to Lasix gtt; however, Lasix and Eliquis stopped 10/15 and patient transferred to ICU after had episode of GI bleed with drop in hgb,  s/p 2U pRBC and vasopressor support initiated.   10/21 Echocardiogram LVEF= 60 to 65%.  -Pulmonary HTN: Assumed right atrial pressure of 15 mmHg, the  estimated right ventricular systolic pressure is 40.3 mmHg.  Pericardium: A small pericardial  effusion is present.  10/27 LEFT thoracentesis: Aspirated 700 mL of clear yellow fluid was removed.  DVT prophylaxis: Eliquis  Code Status: Full Family Communication: 11/3 husband at bedside for discussion plan of care all questions answered Status is: Inpatient       Dispo: The patient is from: Home              Anticipated d/c is to: Home              Anticipated d/c date is: > 3 days              Patient currently is not medically stable to d/c. Will await from Cardiology/nephrology as to when pt can discharge --likely 1-2 days                 TOTAL TIME TAKING CARE OF THIS PATIENT: 25 minutes.  >50% time spent on counselling and coordination of care  Note: This dictation was prepared with Dragon dictation along with smaller phrase technology. Any transcriptional errors that result from this process are unintentional.  Karen Farley M.D    [facility]   CC: Primary care physician; Valerie Roys, DO Patient ID: [name], female   DOB: 01/25/1946, 75 y.o.   MRN: [id number]

## 2021-08-23 NOTE — Progress Notes (Signed)
*  PRELIMINARY RESULTS* Echocardiogram 2D Echocardiogram has been performed.  Sherrie Sport 08/23/2021, 1:03 PM

## 2021-08-23 NOTE — Progress Notes (Signed)
Progress Note  Patient Name: Karen Farley Date of Encounter: 08/23/2021  [facility] Cardiologist: Kate Sable, MD   Subjective   Continues aggressive diuresis Has not been using her lymphedema compression pumps as inpatient, still with significant leg swelling, has Ace wraps in place Reports that she typically uses the compression pumps at home on a regular basis Abdomen still feels mildly distended from fluid -Preliminary review of echocardiogram performed today, no tamponade noted She is indicating she does not want surgery for pericardial window  Inpatient Medications    Scheduled Meds:  apixaban  5 mg Oral BID   Chlorhexidine Gluconate Cloth  6 each Topical Daily   docusate sodium  100 mg Oral Daily   folic acid  1 mg Oral Daily   hydrocortisone   Rectal TID   insulin aspart  0-5 Units Subcutaneous QHS   insulin aspart  0-9 Units Subcutaneous TID WC   iron polysaccharides  150 mg Oral Daily   latanoprost  1 drop Both Eyes QHS   metoprolol succinate  12.5 mg Oral Daily   midodrine  10 mg Oral TID WC   pantoprazole (PROTONIX) IV  40 mg Intravenous Q12H   polyethylene glycol  17 g Oral Daily   potassium chloride  40 mEq Oral TID   Ensure Max Protein  11 oz Oral BID   spironolactone  25 mg Oral Daily   vitamin B-12  500 mcg Oral Daily   Continuous Infusions:  sodium chloride Stopped (08/18/21 0325)   furosemide (LASIX) 200 mg in dextrose 5% 100 mL (2mg /mL) infusion 8 mg/hr (08/23/21 0402)   PRN Meds: acetaminophen, bisacodyl, lidocaine HCl (PF), lip balm, traMADol   Vital Signs    Vitals:   08/23/21 0500 08/23/21 0733 08/23/21 1246 08/23/21 1556  BP:  (!) 107/59 114/64 100/69  Pulse:  93  83  Resp:  18 19 18   Temp:  98.2 F (36.8 C) 98.2 F (36.8 C) 98.2 F (36.8 C)  TempSrc:   Oral   SpO2:  94% 95% 100%  Weight: 85.8 kg     Height:        Intake/Output Summary (Last 24 hours) at 08/23/2021 1558 Last data filed at 08/23/2021 1557 Gross  per 24 hour  Intake 1200 ml  Output 3500 ml  Net -2300 ml   Last 3 Weights 08/23/2021 08/22/2021 08/22/2021  Weight (lbs) 189 lb 3.2 oz 203 lb 11.3 oz 203 lb 14.8 oz  Weight (kg) 85.821 kg 92.4 kg 92.5 kg      Telemetry    Atrial fibrillation rate 80 bpm personally Reviewed  ECG    - Personally Reviewed  Physical Exam   Constitutional:  oriented to person, place, and time. No distress.  HENT:  Head: Grossly normal Eyes:  no discharge. No scleral icterus.  Neck: No JVD, no carotid bruits  Cardiovascular: Irregularly irregular, no murmurs appreciated Pulmonary/Chest: Clear to auscultation bilaterally, no wheezes or rales Abdominal: Soft.  no distension.  no tenderness.  Musculoskeletal: Normal range of motion Neurological:  normal muscle tone. Coordination normal. No atrophy Skin: Skin warm and dry Psychiatric: normal affect, pleasant  Labs    High Sensitivity Troponin:   Recent Labs  Lab 07/26/21 1708  TROPONINIHS 17     Chemistry Recent Labs  Lab 08/21/21 0540 08/22/21 0610 08/23/21 0800  NA 138 139 137  K 3.8 3.5 3.4*  CL 96* 93* 90*  CO2 34* 36* 37*  GLUCOSE 178* 172* 167*  BUN 43*  45* 49*  CREATININE 1.67* 1.70* 1.67*  CALCIUM 9.4 9.6 9.6  MG 2.1 2.1 2.0  PROT 6.1* 6.1* 6.5  ALBUMIN 3.6 3.6 3.7  AST 20 22 23   ALT 14 15 15   ALKPHOS 77 78 84  BILITOT 2.0* 2.0* 1.9*  GFRNONAA 32* 31* 32*  ANIONGAP 8 10 10     Lipids No results for input(s): CHOL, TRIG, HDL, LABVLDL, LDLCALC, CHOLHDL in the last 168 hours.  Hematology Recent Labs  Lab 08/21/21 0540 08/22/21 0610 08/23/21 0800  WBC 4.5 4.1 4.0  RBC 3.65* 3.71* 3.80*  HGB 9.9* 10.2* 10.4*  HCT 32.8* 32.8* 33.9*  MCV 89.9 88.4 89.2  MCH 27.1 27.5 27.4  MCHC 30.2 31.1 30.7  RDW 25.8* 25.1* 24.5*  PLT 380 365 348   Thyroid No results for input(s): TSH, FREET4 in the last 168 hours.  BNP No results for input(s): BNP, PROBNP in the last 168 hours.   DDimer No results for input(s): DDIMER in  the last 168 hours.   Radiology    No results found.  Cardiac Studies   Echo  1. Left ventricular ejection fraction, by estimation, is 60 to 65%. The  left ventricle has normal function. There is mild left ventricular  hypertrophy.   2. Right ventricular systolic function is normal. The right ventricular  size is normal. There is severely elevated pulmonary artery systolic  pressure.   3. A small pericardial effusion is present. The pericardial effusion is  posterior to the left ventricle and localized near the right atrium.   4. The aortic valve has an indeterminant number of cusps. There is  moderate calcification of the aortic valve. There is moderate thickening  of the aortic valve.   5. The inferior vena cava is dilated in size with <50% respiratory  variability, suggesting right atrial pressure of 15 mmHg.    Patient Profile     75 y.o. female with history of HFpEF, hypertension, CKD 3, permanent atrial fibrillation presenting with hypotension, GI bleed and large pericardial effusion s/p pericardial drainage placement.  Pericardial effusion, hemorrhagic On arrival requiring  pericardiocentesis and drain placement. hemorrhagic effusion noted -Pericardial drainage tube has been removed Initial echo with no reaccumulation, echocardiogram today significant effusion with no tamponade -Reports that she is asymptomatic, does not want surgery with pericardial window   2.  HFpEF, massive volume overload Presenting with Anasarca arms legs abdomen Has had aggressive Lasix throughout her hospital course, -Continues on Lasix infusion, -23 L -Renal function stable Does not feel that she is ready given abdominal distention, leg swelling -I suspect that she is correct and will need diuresis through this weekend -Recommend she bring in her lymphedema compression pumps for use in the hospital  3.  Permanent A. Fib Rate well controlled, low blood pressure limiting titration of  medication Tolerating metoprolol   4.  Hypotension/shock In the setting of presumed lower GI bleed, "blow out" several weeks ago Initially placed on pressors, this has since been weaned Has continued to require midodrine 3 times daily Blood pressure still borderline low  5.  Lymphedema Recommended she bring her lymphedema compression pumps from home     Total encounter time more than 35 minutes  Greater than 50% was spent in counseling and coordination of care with the patient   For questions or updates, please contact [facility] Please consult [email or web address] for contact info under        Signed, Ida Rogue, MD  08/23/2021, 3:58 PM

## 2021-08-23 NOTE — Plan of Care (Signed)
  Problem: Education: Goal: Knowledge of General Education information will improve Description: Including pain rating scale, medication(s)/side effects and non-pharmacologic comfort measures 08/23/2021 1604 by Cristela Blue, RN Outcome: Progressing 08/23/2021 1604 by Cristela Blue, RN Outcome: Progressing   Problem: Health Behavior/Discharge Planning: Goal: Ability to manage health-related needs will improve 08/23/2021 1604 by Cristela Blue, RN Outcome: Progressing 08/23/2021 1604 by Cristela Blue, RN Outcome: Progressing   Problem: Clinical Measurements: Goal: Ability to maintain clinical measurements within normal limits will improve 08/23/2021 1604 by Cristela Blue, RN Outcome: Progressing 08/23/2021 1604 by Cristela Blue, RN Outcome: Progressing Goal: Will remain free from infection 08/23/2021 1604 by Cristela Blue, RN Outcome: Progressing 08/23/2021 1604 by Cristela Blue, RN Outcome: Progressing Goal: Diagnostic test results will improve 08/23/2021 1604 by Cristela Blue, RN Outcome: Progressing 08/23/2021 1604 by Cristela Blue, RN Outcome: Progressing Goal: Respiratory complications will improve 08/23/2021 1604 by Cristela Blue, RN Outcome: Progressing 08/23/2021 1604 by Cristela Blue, RN Outcome: Progressing Goal: Cardiovascular complication will be avoided 08/23/2021 1604 by Cristela Blue, RN Outcome: Progressing 08/23/2021 1604 by Cristela Blue, RN Outcome: Progressing   Problem: Activity: Goal: Risk for activity intolerance will decrease 08/23/2021 1604 by Cristela Blue, RN Outcome: Progressing 08/23/2021 1604 by Cristela Blue, RN Outcome: Progressing   Problem: Nutrition: Goal: Adequate nutrition will be maintained 08/23/2021 1604 by Cristela Blue, RN Outcome: Progressing 08/23/2021 1604 by Cristela Blue, RN Outcome: Progressing   Problem: Coping: Goal: Level of anxiety will decrease 08/23/2021 1604 by Cristela Blue, RN Outcome:  Progressing 08/23/2021 1604 by Cristela Blue, RN Outcome: Progressing   Problem: Elimination: Goal: Will not experience complications related to bowel motility 08/23/2021 1604 by Cristela Blue, RN Outcome: Progressing 08/23/2021 1604 by Cristela Blue, RN Outcome: Progressing Goal: Will not experience complications related to urinary retention 08/23/2021 1604 by Cristela Blue, RN Outcome: Progressing 08/23/2021 1604 by Cristela Blue, RN Outcome: Progressing   Problem: Pain Managment: Goal: General experience of comfort will improve 08/23/2021 1604 by Cristela Blue, RN Outcome: Progressing 08/23/2021 1604 by Cristela Blue, RN Outcome: Progressing   Problem: Safety: Goal: Ability to remain free from injury will improve 08/23/2021 1604 by Cristela Blue, RN Outcome: Progressing 08/23/2021 1604 by Cristela Blue, RN Outcome: Progressing   Problem: Skin Integrity: Goal: Risk for impaired skin integrity will decrease 08/23/2021 1604 by Cristela Blue, RN Outcome: Progressing 08/23/2021 1604 by Cristela Blue, RN Outcome: Progressing   Problem: Education: Goal: Ability to demonstrate management of disease process will improve 08/23/2021 1604 by Cristela Blue, RN Outcome: Progressing 08/23/2021 1604 by Cristela Blue, RN Outcome: Progressing Goal: Ability to verbalize understanding of medication therapies will improve 08/23/2021 1604 by Cristela Blue, RN Outcome: Progressing 08/23/2021 1604 by Cristela Blue, RN Outcome: Progressing Goal: Individualized Educational Video(s) 08/23/2021 1604 by Cristela Blue, RN Outcome: Progressing 08/23/2021 1604 by Cristela Blue, RN Outcome: Progressing   Problem: Activity: Goal: Capacity to carry out activities will improve 08/23/2021 1604 by Cristela Blue, RN Outcome: Progressing 08/23/2021 1604 by Cristela Blue, RN Outcome: Progressing   Problem: Cardiac: Goal: Ability to achieve and maintain adequate cardiopulmonary perfusion will  improve 08/23/2021 1604 by Cristela Blue, RN Outcome: Progressing 08/23/2021 1604 by Cristela Blue, RN Outcome: Progressing

## 2021-08-23 NOTE — Progress Notes (Signed)
Physical Therapy Treatment Patient Details Name: Karen Farley MRN: [id number] DOB: 22-May-1946 Today's Date: 08/23/2021   History of Present Illness 75 y/o female history HFpEF, Afib on Eliquis, PAD, DM, CKDIII, lymphedema, HTN admitted to hospital 10/6 with sob, orthopnea, weight gain with hypotension, large left pleural effusion, generalized anasarca on admission. S/p left thoracentesis 10/8 630ml removed and paracentesis 10/12 1.2L removed. Failed Lasix IV advanced to Lasix gtt; however, Lasix and Eliquis stopped 10/15 and patient transferred to ICU after had episode of GI bleed with drop in hgb, s/p 2U pRBC and vasopressor support initiated. Echo done 10/16 showed large pericardial effusion s/p pericardiocentesis 776mL fluid removed with drain placed.    PT Comments    Patient is making progress with functional independence with increased activity tolerance this session. Patient progressed to ambulating in the room and walked 30ft with rolling walker with cues for safety and only Min guard assistance. Patient continues to show progress towards meeting PT goals. Recommend to continue PT to maximize independence in preparation for home discharge.    Recommendations for follow up therapy are one component of a multi-disciplinary discharge planning process, led by the attending physician.  Recommendations may be updated based on patient status, additional functional criteria and insurance authorization.  Follow Up Recommendations  Home health PT     Assistance Recommended at Discharge Intermittent Supervision/Assistance  Equipment Recommendations  None recommended by PT    Recommendations for Other Services       Precautions / Restrictions Precautions Precautions: Fall Restrictions Weight Bearing Restrictions: No     Mobility  Bed Mobility               General bed mobility comments: not observed as patient sitting up on arrival to room and post session     Transfers Overall transfer level: Needs assistance Equipment used: Rolling walker (2 wheels) Transfers: Sit to/from Stand Sit to Stand: Min guard           General transfer comment: verbal cues for safety and technique    Ambulation/Gait Ambulation/Gait assistance: Min guard Gait Distance (Feet): 28 Feet Assistive device: Rolling walker (2 wheels) Gait Pattern/deviations: Step-through pattern;Wide base of support Gait velocity: decreased   General Gait Details: verbal cues for technique using assistive device and importance of using rolling walker for ambulation for safety and fall prevention. no significant shortness of breath noted. Sp02 98% on room air and heart rate 107bpm after walking.   Stairs             Wheelchair Mobility    Modified Rankin (Stroke Patients Only)       Balance Overall balance assessment: Needs assistance Sitting-balance support: Feet supported Sitting balance-Leahy Scale: Normal     Standing balance support: Bilateral upper extremity supported;Reliant on assistive device for balance Standing balance-Leahy Scale: Good                              Cognition Arousal/Alertness: Awake/alert Behavior During Therapy: WFL for tasks assessed/performed Overall Cognitive Status: Within Functional Limits for tasks assessed                                          Exercises      General Comments General comments (skin integrity, edema, etc.): encouraged patient to perform ankle pumps and LE exeercise/ROM for strengthening and  edema management      Pertinent Vitals/Pain Pain Assessment: No/denies pain    Home Living                          Prior Function            PT Goals (current goals can now be found in the care plan section) Acute Rehab PT Goals Patient Stated Goal: to be able to walk to the bathroom PT Goal Formulation: With patient Time For Goal Achievement: 08/29/21 Potential  to Achieve Goals: Good Progress towards PT goals: Progressing toward goals    Frequency    Min 2X/week      PT Plan Current plan remains appropriate    Co-evaluation              AM-PAC PT "6 Clicks" Mobility   Outcome Measure  Help needed turning from your back to your side while in a flat bed without using bedrails?: A Little Help needed moving from lying on your back to sitting on the side of a flat bed without using bedrails?: A Little Help needed moving to and from a bed to a chair (including a wheelchair)?: A Little Help needed standing up from a chair using your arms (e.g., wheelchair or bedside chair)?: A Little Help needed to walk in hospital room?: A Little Help needed climbing 3-5 steps with a railing? : A Lot 6 Click Score: 17    End of Session Equipment Utilized During Treatment: Gait belt Activity Tolerance: Patient tolerated treatment well Patient left: in chair;with call bell/phone within reach Nurse Communication: Mobility status;Other (comment) (alerted nurse aide that patient requesting to have purwick placed by nursing staff) PT Visit Diagnosis: Muscle weakness (generalized) (M62.81);Difficulty in walking, not elsewhere classified (R26.2);Unsteadiness on feet (R26.81)     Time: 2449-7530 PT Time Calculation (min) (ACUTE ONLY): 34 min  Charges:  $Gait Training: 8-22 mins $Therapeutic Activity: 8-22 mins                     [name], PT, MPT    [name] 08/23/2021, 11:19 AM

## 2021-08-23 NOTE — Progress Notes (Signed)
Heart Failure Nurse Navigator Note  Talk with the patient and her husband about the importance of following up in the outpatient heart failure clinic.  She had been reluctant and had canceled her previous appointments in the past but now realizes the importance of following up with those appointments.  He had thought it would be like going to cardiac rehab but explained that no each appointment is a one-on-one appointment, she voices understanding.  Explained that I had made her an appointment to be seen post discharge, she asked that that be canceled and once she sees how many other appointments she has upon discharge that she will call and make her own appointment.  Will continue to follow along.  Pricilla Riffle RN [facility]

## 2021-08-24 DIAGNOSIS — R0609 Other forms of dyspnea: Secondary | ICD-10-CM | POA: Diagnosis not present

## 2021-08-24 DIAGNOSIS — I5031 Acute diastolic (congestive) heart failure: Secondary | ICD-10-CM | POA: Diagnosis not present

## 2021-08-24 DIAGNOSIS — R188 Other ascites: Secondary | ICD-10-CM | POA: Diagnosis not present

## 2021-08-24 DIAGNOSIS — J9601 Acute respiratory failure with hypoxia: Secondary | ICD-10-CM | POA: Diagnosis not present

## 2021-08-24 LAB — COMPREHENSIVE METABOLIC PANEL
ALT: 15 U/L (ref 0–44)
AST: 30 U/L (ref 15–41)
Albumin: 3.5 g/dL (ref 3.5–5.0)
Alkaline Phosphatase: 80 U/L (ref 38–126)
Anion gap: 10 (ref 5–15)
BUN: 43 mg/dL — ABNORMAL HIGH (ref 8–23)
CO2: 36 mmol/L — ABNORMAL HIGH (ref 22–32)
Calcium: 9.4 mg/dL (ref 8.9–10.3)
Chloride: 92 mmol/L — ABNORMAL LOW (ref 98–111)
Creatinine, Ser: 1.7 mg/dL — ABNORMAL HIGH (ref 0.44–1.00)
GFR, Estimated: 31 mL/min — ABNORMAL LOW (ref >60)
Glucose, Bld: 153 mg/dL — ABNORMAL HIGH (ref 70–99)
Potassium: 3.5 mmol/L (ref 3.5–5.1)
Sodium: 138 mmol/L (ref 135–145)
Total Bilirubin: 1.7 mg/dL — ABNORMAL HIGH (ref 0.3–1.2)
Total Protein: 6.2 g/dL — ABNORMAL LOW (ref 6.5–8.1)

## 2021-08-24 LAB — GLUCOSE, CAPILLARY
Glucose-Capillary: 149 mg/dL — ABNORMAL HIGH (ref 70–99)
Glucose-Capillary: 226 mg/dL — ABNORMAL HIGH (ref 70–99)
Glucose-Capillary: 211 mg/dL — ABNORMAL HIGH (ref 70–99)
Glucose-Capillary: 250 mg/dL — ABNORMAL HIGH (ref 70–99)

## 2021-08-24 LAB — PHOSPHORUS: Phosphorus: 3.8 mg/dL (ref 2.5–4.6)

## 2021-08-24 LAB — MAGNESIUM: Magnesium: 2.1 mg/dL (ref 1.7–2.4)

## 2021-08-24 NOTE — Progress Notes (Signed)
Physical Therapy Treatment Patient Details Name: Karen Farley MRN: [id number] DOB: 04-29-1946 Today's Date: 08/24/2021   History of Present Illness 75 y/o female history HFpEF, Afib on Eliquis, PAD, DM, CKDIII, lymphedema, HTN admitted to hospital 10/6 with sob, orthopnea, weight gain with hypotension, large left pleural effusion, generalized anasarca on admission. S/p left thoracentesis 10/8 680ml removed and paracentesis 10/12 1.2L removed. Failed Lasix IV advanced to Lasix gtt; however, Lasix and Eliquis stopped 10/15 and patient transferred to ICU after had episode of GI bleed with drop in hgb, s/p 2U pRBC and vasopressor support initiated. Echo done 10/16 showed large pericardial effusion s/p pericardiocentesis 742mL fluid removed with drain placed.    PT Comments    Pt received seated in recliner with spouse present. Agreeable with PT. HR at rest in low 100's prior to mobility. Pt able to perform STS transfer with supervision to RW and safe hand placement which is improved from previous session. Tolerated progression of ambulation to 110' with supervision. Noted slow and cautious gait, but able to dual task with conversation and correct use of RW without any LOB. Safe turning with good foot clearance. Heavy reliance of UE's on RW along with subjective reports from pt that her Ue's fatiguing due to increased use to assist in off loading LE's. Pt returning to room with max HR up to 133 BPM that quickly decrease to 106 BPM with seated rest. Pt educated on continuous performance of LE exercise for edema and strength. Educated on simple progressions in standing if seated therex becomes easier. Pt and spouse verbalizing understanding. Pt will continue to benefit from [facility] PT to improve endurance with OOB mobility and strength deficits in home environment.  Recommendations for follow up therapy are one component of a multi-disciplinary discharge planning process, led by the attending physician.   Recommendations may be updated based on patient status, additional functional criteria and insurance authorization.  Follow Up Recommendations  Home health PT     Assistance Recommended at Discharge Intermittent Supervision/Assistance  Equipment Recommendations  None recommended by PT    Recommendations for Other Services       Precautions / Restrictions Precautions Precautions: Fall Restrictions Weight Bearing Restrictions: No     Mobility  Bed Mobility               General bed mobility comments: not observed as patient sitting up on arrival to room and post session Patient Response: Cooperative  Transfers Overall transfer level: Needs assistance Equipment used: Rolling walker (2 wheels) Transfers: Sit to/from Stand Sit to Stand: Supervision           General transfer comment: Safe use of hands with transfers this session.    Ambulation/Gait Ambulation/Gait assistance: Supervision Gait Distance (Feet): 110 Feet Assistive device: Rolling walker (2 wheels) Gait Pattern/deviations: Step-through pattern;Wide base of support     General Gait Details: Max HR with ambulation up to 133 BPM. Returned to 106 BPM at rest.   Stairs             Wheelchair Mobility    Modified Rankin (Stroke Patients Only)       Balance Overall balance assessment: Needs assistance Sitting-balance support: Feet supported Sitting balance-Leahy Scale: Normal     Standing balance support: Bilateral upper extremity supported;Reliant on assistive device for balance Standing balance-Leahy Scale: Good  Cognition Arousal/Alertness: Awake/alert Behavior During Therapy: WFL for tasks assessed/performed Overall Cognitive Status: Within Functional Limits for tasks assessed                                          Exercises      General Comments General comments (skin integrity, edema, etc.): Educated and  reinforced previous PT on importance of LE exercises for edema management and strenghtening      Pertinent Vitals/Pain Pain Assessment: No/denies pain    Home Living                          Prior Function            PT Goals (current goals can now be found in the care plan section) Acute Rehab PT Goals Patient Stated Goal: to be able to walk to the bathroom PT Goal Formulation: With patient Time For Goal Achievement: 08/29/21 Potential to Achieve Goals: Good Progress towards PT goals: Progressing toward goals    Frequency    Min 2X/week      PT Plan Current plan remains appropriate    Co-evaluation              AM-PAC PT "6 Clicks" Mobility   Outcome Measure  Help needed turning from your back to your side while in a flat bed without using bedrails?: A Little Help needed moving from lying on your back to sitting on the side of a flat bed without using bedrails?: A Little Help needed moving to and from a bed to a chair (including a wheelchair)?: A Little Help needed standing up from a chair using your arms (e.g., wheelchair or bedside chair)?: A Little Help needed to walk in hospital room?: A Little Help needed climbing 3-5 steps with a railing? : A Lot 6 Click Score: 17    End of Session Equipment Utilized During Treatment: Gait belt Activity Tolerance: Patient tolerated treatment well Patient left: in chair;with call bell/phone within reach;with family/visitor present Nurse Communication: Mobility status PT Visit Diagnosis: Muscle weakness (generalized) (M62.81);Difficulty in walking, not elsewhere classified (R26.2);Unsteadiness on feet (R26.81)     Time: 5427-0623 PT Time Calculation (min) (ACUTE ONLY): 17 min  Charges:  $Therapeutic Exercise: 8-22 mins                    Salem Caster. Fairly IV, PT, DPT Physical Therapist- [facility]  08/24/2021, 3:23 PM

## 2021-08-24 NOTE — Progress Notes (Signed)
[facility]  ROUNDING NOTE   Subjective:   Karen Farley was admitted to [facility] on 07/26/2021 for Morbid obesity ([place]) [E66.01] DOE (dyspnea on exertion) [R06.09] Recurrent left pleural effusion [J90] Hypotension [I95.9] Type 2 diabetes mellitus without complication, without long-term current use of insulin ([place]) [E11.9] Atrial fibrillation, unspecified type ([place]) [I48.91] Chronic congestive heart failure, unspecified heart failure type ([place]) [I50.9]  Patient seated in chair today, alert and oriented Denies nausea, vomiting, shortness of breath. States after discussing with her husband, they feel she should remain hospitalized until more fluid has been removed from her legs and her abdomen.  They also have concerns of fluid around her heart. Creatinine stable, urine output of 3 L in preceding 24 hours.   Objective:  Vital signs in last 24 hours:  Temp:  [97.8 F (36.6 C)-98.3 F (36.8 C)] 98 F (36.7 C) (11/04 1143) Pulse Rate:  [74-106] 91 (11/04 1143) Resp:  [18-20] 18 (11/04 1143) BP: (100-134)/(64-97) 134/97 (11/04 1143) SpO2:  [95 %-100 %] 100 % (11/04 1143) Weight:  [85.7 kg] 85.7 kg (11/04 0222)  Weight change: -6.7 kg Filed Weights   08/22/21 1100 08/23/21 0500 08/24/21 0222  Weight: 92.4 kg 85.8 kg 85.7 kg    Intake/Output: I/O last 3 completed shifts: In: 1560 [P.O.:1560] Out: 3550 [Urine:3550]   Intake/Output this shift:  Total I/O In: 240 [P.O.:240] Out: 1000 [Urine:1000]  Physical Exam: General: In no acute distress, sitting in chair  Head: oral mucosal membranes moist  Lungs:  Clear bilaterally, normal effort  Heart: S1S2, irregular  Abdomen:  Soft, Non tender  Extremities:  + peripheral edema. Anasarca +  Neurologic: Awake, alert, able to answer questions appropriately  Skin: No acute lesions or rashes        Basic Metabolic Panel: Recent Labs  Lab 08/20/21 0650 08/21/21 0540 08/22/21 0610 08/23/21 0800 08/24/21 0748   NA 138 138 139 137 138  K 3.3* 3.8 3.5 3.4* 3.5  CL 93* 96* 93* 90* 92*  CO2 35* 34* 36* 37* 36*  GLUCOSE 174* 178* 172* 167* 153*  BUN 45* 43* 45* 49* 43*  CREATININE 1.76* 1.67* 1.70* 1.67* 1.70*  CALCIUM 9.3 9.4 9.6 9.6 9.4  MG 2.2 2.1 2.1 2.0 2.1  PHOS 3.1 2.7 3.1 3.8 3.8     Liver Function Tests: Recent Labs  Lab 08/20/21 0650 08/21/21 0540 08/22/21 0610 08/23/21 0800 08/24/21 0748  AST 19 20 22 23 30   ALT 16 14 15 15 15   ALKPHOS 71 77 78 84 80  BILITOT 2.3* 2.0* 2.0* 1.9* 1.7*  PROT 6.1* 6.1* 6.1* 6.5 6.2*  ALBUMIN 3.8 3.6 3.6 3.7 3.5    No results for input(s): LIPASE, AMYLASE in the last 168 hours. No results for input(s): AMMONIA in the last 168 hours.  CBC: Recent Labs  Lab 08/19/21 0635 08/20/21 0650 08/21/21 0540 08/22/21 0610 08/23/21 0800  WBC 6.2 5.7 4.5 4.1 4.0  NEUTROABS  --   --  3.2 2.7 2.7  HGB 9.6* 9.6* 9.9* 10.2* 10.4*  HCT 29.6* 30.1* 32.8* 32.8* 33.9*  MCV 88.9 89.1 89.9 88.4 89.2  PLT 351 356 380 365 348     Cardiac Enzymes: No results for input(s): CKTOTAL, CKMB, CKMBINDEX, TROPONINI in the last 168 hours.  BNP: Invalid input(s): POCBNP  CBG: Recent Labs  Lab 08/23/21 1202 08/23/21 1626 08/23/21 2056 08/24/21 0809 08/24/21 1143  GLUCAP 239* 250* 235* 149* 226*     Microbiology: Results for orders placed or  performed during the hospital encounter of 07/26/21  Resp Panel by RT-PCR (Flu A&B, Covid) Nasopharyngeal Swab     Status: None   Collection Time: 07/26/21  7:19 PM   Specimen: Nasopharyngeal Swab; Nasopharyngeal(NP) swabs in vial transport medium  Result Value Ref Range Status   SARS Coronavirus 2 by RT PCR NEGATIVE NEGATIVE Final    Comment: (NOTE) SARS-CoV-2 target nucleic acids are NOT DETECTED.  The SARS-CoV-2 RNA is generally detectable in upper respiratory specimens during the acute phase of infection. The lowest concentration of SARS-CoV-2 viral copies this assay can detect is 138 copies/mL. A  negative result does not preclude SARS-Cov-2 infection and should not be used as the sole basis for treatment or other patient management decisions. A negative result may occur with  improper specimen collection/handling, submission of specimen other than nasopharyngeal swab, presence of viral mutation(s) within the areas targeted by this assay, and inadequate number of viral copies(<138 copies/mL). A negative result must be combined with clinical observations, patient history, and epidemiological information. The expected result is Negative.  Fact Sheet for Patients:  EntrepreneurPulse.com.au  Fact Sheet for Healthcare Providers:  IncredibleEmployment.be  This test is no t yet approved or cleared by the [place] FDA and  has been authorized for detection and/or diagnosis of SARS-CoV-2 by FDA under an Emergency Use Authorization (EUA). This EUA will remain  in effect (meaning this test can be used) for the duration of the COVID-19 declaration under Section 564(b)(1) of the Act, 21 U.S.C.section 360bbb-3(b)(1), unless the authorization is terminated  or revoked sooner.       Influenza A by PCR NEGATIVE NEGATIVE Final   Influenza B by PCR NEGATIVE NEGATIVE Final    Comment: (NOTE) The Xpert Xpress SARS-CoV-2/FLU/RSV plus assay is intended as an aid in the diagnosis of influenza from Nasopharyngeal swab specimens and should not be used as a sole basis for treatment. Nasal washings and aspirates are unacceptable for Xpert Xpress SARS-CoV-2/FLU/RSV testing.  Fact Sheet for Patients: EntrepreneurPulse.com.au  Fact Sheet for Healthcare Providers: IncredibleEmployment.be  This test is not yet approved or cleared by the [place] FDA and has been authorized for detection and/or diagnosis of SARS-CoV-2 by FDA under an Emergency Use Authorization (EUA). This EUA will remain in effect (meaning this test can  be used) for the duration of the COVID-19 declaration under Section 564(b)(1) of the Act, 21 U.S.C. section 360bbb-3(b)(1), unless the authorization is terminated or revoked.  Performed at [facility], [address] [place], [place] [address]   Body fluid culture w Gram Stain     Status: None   Collection Time: 07/28/21  1:53 PM   Specimen: PATH Cytology Pleural fluid  Result Value Ref Range Status   Specimen Description   Final    PLEURAL Performed at [facility], [address] [place], [place] [address]    Special Requests   Final    NONE Performed at [facility], [address] [place], [place] [address]    Gram Stain   Final    NO SQUAMOUS EPITHELIAL CELLS SEEN FEW WBC SEEN NO ORGANISMS SEEN    Culture   Final    NO GROWTH 3 DAYS Performed at [facility], [place] [address] [place], [place] [address]    Report Status 08/01/2021 FINAL  Final  Body fluid culture w Gram Stain     Status: None   Collection Time: 08/01/21  3:36 PM   Specimen: PATH Cytology Peritoneal fluid  Result Value Ref Range Status   Specimen Description   Final    PERITONEAL Performed at [facility], [address] [place], [place] [address]    Special Requests   Final    NONE Performed at [facility], [place], [place] [address]    Gram Stain   Final    RARE WBC PRESENT,BOTH PMN AND MONONUCLEAR NO ORGANISMS SEEN    Culture   Final    NO GROWTH 3 DAYS Performed at [facility], [place] [address] [place], [place] [address]    Report Status 08/05/2021 FINAL  Final  MRSA Next Gen by PCR, Nasal     Status: None   Collection Time: 08/04/21  8:56 PM   Specimen: Nasal Mucosa; Nasal Swab  Result Value Ref Range Status   MRSA by PCR Next Gen NOT DETECTED NOT DETECTED Final    Comment: (NOTE) The GeneXpert MRSA Assay (FDA approved for NASAL specimens only), is one component of a comprehensive MRSA colonization  surveillance program. It is not intended to diagnose MRSA infection nor to guide or monitor treatment for MRSA infections. Test performance is not FDA approved in patients less than 62 years old. Performed at [facility], [address] [place], [place] [address]   Body fluid culture w Gram Stain     Status: None   Collection Time: 08/05/21  5:50 PM   Specimen: Pericardial  Result Value Ref Range Status   Specimen Description   Final    PERICARDIAL Performed at [facility], [address] [place], [place] [address]    Special Requests   Final    NONE Performed at [facility], [address] [place], [place] [address]    Gram Stain   Final    FEW WBC PRESENT, PREDOMINANTLY MONONUCLEAR NO ORGANISMS SEEN    Culture   Final    NO GROWTH 3 DAYS Performed at [facility], [place] [address], [place], [place] [address]    Report Status 08/09/2021 FINAL  Final  Fungus Culture With Stain     Status: None (Preliminary result)   Collection Time: 08/16/21 11:46 AM   Specimen: PATH Cytology Pleural fluid  Result Value Ref Range Status   Fungus Stain Final report  Final    Comment: (NOTE) Performed At: [facility] [place], [place] [id number] Rush Farmer MD [id number]    Fungus (Mycology) Culture PENDING  Incomplete   Fungal Source PLEURAL  Final    Comment: Performed at [facility], [address] [place], [place] [address]  Acid Fast Smear (AFB)     Status: None   Collection Time: 08/16/21 11:46 AM   Specimen: PATH Cytology Pleural fluid  Result Value Ref Range Status   AFB Specimen Processing Concentration  Final   Acid Fast Smear Negative  Final    Comment: (NOTE) Performed At: [facility] [place], [place] [id number] Rush Farmer MD [id number]    Source (AFB) PLEURAL  Final    Comment: Performed at [facility], [address] [place], [place] [address]   Body fluid culture w Gram Stain     Status: None   Collection Time: 08/16/21 11:46 AM   Specimen: PATH Cytology Pleural fluid  Result Value Ref Range Status   Specimen Description   Final    PLEURAL Performed at [facility], [address], [place], [place] [address]  Special Requests   Final    NONE Performed at [facility], [address] [place], [place] [address]    Gram Stain   Final    WBC PRESENT, PREDOMINANTLY MONONUCLEAR NO ORGANISMS SEEN CYTOSPIN SMEAR    Culture   Final    NO GROWTH 3 DAYS Performed at [facility], [place] [address] [place], [place] [address]    Report Status 08/19/2021 FINAL  Final  Fungus Culture Result     Status: None   Collection Time: 08/16/21 11:46 AM  Result Value Ref Range Status   Result 1 Comment  Final    Comment: (NOTE) KOH/Calcofluor preparation:  no fungus observed. Performed At: [facility] [place], [place] [id number] Rush Farmer MD [id number]     Coagulation Studies: No results for input(s): LABPROT, INR in the last 72 hours.   Urinalysis: No results for input(s): COLORURINE, LABSPEC, PHURINE, GLUCOSEU, HGBUR, BILIRUBINUR, KETONESUR, PROTEINUR, UROBILINOGEN, NITRITE, LEUKOCYTESUR in the last 72 hours.  Invalid input(s): APPERANCEUR    Imaging: ECHOCARDIOGRAM LIMITED  Result Date: 08/23/2021    ECHOCARDIOGRAM LIMITED REPORT   Patient Name:   Karen Farley Date of Exam: 08/23/2021 Medical Rec #:  [id number]          Height:       62.0 in Accession #:    [id number]         Weight:       189.2 lb Date of Birth:  [birth year]           BSA:          1.867 m Patient Age:    75 years           BP:           107/59 mmHg Patient Gender: F                  HR:           93 bpm. Exam Location:  [facility] Procedure: Limited Echo, Cardiac Doppler and Color Doppler Indications:     pericardial Effusion I31.3  History:         Patient has prior history of Echocardiogram examinations,  most                  recent 08/19/2021. Risk Factors:Diabetes, Hypertension and                  Dyslipidemia. CKD stage 3.  Sonographer:     Sherrie Sport Referring Phys:  [id number] SONA PATEL Diagnosing Phys: Ida Rogue MD  Sonographer Comments: Suboptimal apical window. IMPRESSIONS  1. Left pleural effusion noted, 5 cm  2. Moderate pericardial effusion, estimated 1.9 off the LV free wall, minimal off the RV free wall (0.4 cm) . There is no evidence of cardiac tamponade.  3. Left ventricular ejection fraction, by estimation, is 60 to 65%. The left ventricle has normal function. The left ventricle has no regional wall motion abnormalities. There is mild left ventricular hypertrophy.  4. Right ventricular systolic function is normal. The right ventricular size is normal.  5. Left atrial size was severely dilated.  6. The mitral valve is normal in structure. Moderate mitral valve regurgitation. No evidence of mitral stenosis.  7. The aortic valve is normal in structure. Aortic valve regurgitation is not visualized. Mild to moderate aortic valve sclerosis/calcification is present, without any evidence of aortic stenosis. FINDINGS  Left Ventricle: Left ventricular ejection fraction, by estimation, is  60 to 65%. The left ventricle has normal function. The left ventricle has no regional wall motion abnormalities. The left ventricular internal cavity size was normal in size. There is  mild left ventricular hypertrophy. Right Ventricle: The right ventricular size is normal. No increase in right ventricular wall thickness. Right ventricular systolic function is normal. Left Atrium: Left atrial size was severely dilated. Right Atrium: Right atrial size was normal in size. Pericardium: A moderately sized pericardial effusion is present. There is no evidence of cardiac tamponade. Mitral Valve: The mitral valve is normal in structure. Moderate mitral valve regurgitation. No evidence of mitral valve stenosis. Tricuspid Valve: The  tricuspid valve is normal in structure. Tricuspid valve regurgitation is not demonstrated. No evidence of tricuspid stenosis. Aortic Valve: The aortic valve is normal in structure. Aortic valve regurgitation is not visualized. Mild to moderate aortic valve sclerosis/calcification is present, without any evidence of aortic stenosis. Pulmonic Valve: The pulmonic valve was normal in structure. Pulmonic valve regurgitation is not visualized. No evidence of pulmonic stenosis. Aorta: The aortic root is normal in size and structure. Venous: The inferior vena cava was not well visualized. The inferior vena cava is normal in size with greater than 50% respiratory variability, suggesting right atrial pressure of 3 mmHg. IAS/Shunts: No atrial level shunt detected by color flow Doppler. LEFT VENTRICLE PLAX 2D LVIDd:         3.70 cm LVIDs:         1.90 cm LV PW:         1.30 cm LV IVS:        1.05 cm  RIGHT VENTRICLE RV Basal diam:  2.70 cm LEFT ATRIUM            Index LA diam:      4.60 cm  2.46 cm/m LA Vol (A4C): 120.0 ml 64.27 ml/m   AORTA Ao Root diam: 2.60 cm MITRAL VALVE                TRICUSPID VALVE MV Area (PHT): 6.71 cm     TR Peak grad:   23.4 mmHg MV Decel Time: 113 msec     TR Vmax:        242.00 cm/s MV E velocity: 132.00 cm/s Ida Rogue MD Electronically signed by Ida Rogue MD Signature Date/Time: 08/23/2021/6:02:48 PM    Final      Medications:    sodium chloride Stopped (08/18/21 0325)   furosemide (LASIX) 200 mg in dextrose 5% 100 mL (2mg /mL) infusion 8 mg/hr (08/23/21 0402)    apixaban  5 mg Oral BID   Chlorhexidine Gluconate Cloth  6 each Topical Daily   docusate sodium  100 mg Oral Daily   folic acid  1 mg Oral Daily   hydrocortisone   Rectal TID   insulin aspart  0-5 Units Subcutaneous QHS   insulin aspart  0-9 Units Subcutaneous TID WC   iron polysaccharides  150 mg Oral Daily   latanoprost  1 drop Both Eyes QHS   metoprolol succinate  12.5 mg Oral Daily   midodrine  10 mg Oral  TID WC   pantoprazole (PROTONIX) IV  40 mg Intravenous Q12H   polyethylene glycol  17 g Oral Daily   potassium chloride  40 mEq Oral TID   Ensure Max Protein  11 oz Oral BID   spironolactone  25 mg Oral Daily   vitamin B-12  500 mcg Oral Daily     Assessment/ Plan:  Karen Farley  is a 75 y.o. white female with diastolic congestive heart failure, hypertension, hyperlipidemia, lymphedema, atrial fibrillation, peripheral vascular disease who is admitted to [facility] on 07/26/2021 for Morbid obesity ([place]) [E66.01] DOE (dyspnea on exertion) [R06.09] Recurrent left pleural effusion [J90] Hypotension [I95.9] Type 2 diabetes mellitus without complication, without long-term current use of insulin (HCC) [E11.9] Atrial fibrillation, unspecified type ([place]) [I48.91] Chronic congestive heart failure, unspecified heart failure type ([place]) [I50.9]  #Acute kidney injury on chronic kidney disease stage IIIB: with baseline creatinine of 1.66, GFR of 36 on 07/13/21. History of bland urine. 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 Hold benazepril. Creatinine stable. Adequate urine output of 3L over the past 24 hours. Daily standing weight.  Discussed with care team about transitioning to oral diuretics today, cardiology recommends maintaining IV Lasix drip to optimize fluid removal.  Will continue Furosemide infusion 8 mg/hr and spironolactone.  Will address transition to oral diuretics in 1 to 2 days  #Hypotension Blood Pressure remains soft 134/97 Midodrine 10 mg 3 times a day  #Acute exacerbation of diastolic congestive heart failure:  ECHO performed on 08/24/21 shows moderate pericardial effusion Cardiology team following and monitoring.    LOS: [place] 11/4/202212:35 PM

## 2021-08-24 NOTE — Progress Notes (Signed)
Karen Farley at [place] NAME: Karen Farley    MR#:  [id number]  DATE OF BIRTH:  Sep 13, 1946  SUBJECTIVE:   Sitting in the chair.Overall doing well. Able to get up from the bed by herself.  REVIEW OF SYSTEMS:   Review of Systems  Constitutional:  Negative for chills, fever and weight loss.  HENT:  Negative for ear discharge, ear pain and nosebleeds.   Eyes:  Negative for blurred vision, pain and discharge.  Respiratory:  Negative for sputum production, shortness of breath, wheezing and stridor.   Cardiovascular:  Positive for leg swelling. Negative for chest pain, palpitations, orthopnea and PND.  Gastrointestinal:  Negative for abdominal pain, diarrhea, nausea and vomiting.  Genitourinary:  Negative for frequency and urgency.  Musculoskeletal:  Negative for back pain and joint pain.  Neurological:  Positive for weakness. Negative for sensory change, speech change and focal weakness.  Psychiatric/Behavioral:  Negative for depression and hallucinations. The patient is not nervous/anxious.   Tolerating Diet: Tolerating PT: HHPT  DRUG ALLERGIES:  No Known Allergies  VITALS:  Blood pressure (!) 134/97, pulse 91, temperature 98 F (36.7 C), resp. rate 18, height 5\' 2"  (1.575 m), weight 85.7 kg, SpO2 100 %.  PHYSICAL EXAMINATION:    GENERAL:  75 y.o.-year-old patient lying in the bed with no acute distress. obese HEENT: Head atraumatic, normocephalic. Oropharynx and nasopharynx clear.  NECK:  Supple, no jugular venous distention. No thyroid enlargement, no tenderness.  LUNGS: decreased breath sounds bilaterally, no wheezing, rales, rhonchi. No use of accessory muscles of respiration.  CARDIOVASCULAR: S1, S2 normal. No murmurs, rubs, or gallops.  ABDOMEN: Soft, nontender, nondistended. Bowel sounds present. No organomegaly or mass.  EXTREMITIES: Bilateral edema + NEUROLOGIC: non-focal PSYCHIATRIC:  patient is alert and oriented x 3.  SKIN:  No obvious rash, lesion, or ulcer.   LABORATORY PANEL:  CBC Recent Labs  Lab 08/23/21 0800  WBC 4.0  HGB 10.4*  HCT 33.9*  PLT 348     Chemistries  Recent Labs  Lab 08/24/21 0748  NA 138  K 3.5  CL 92*  CO2 36*  GLUCOSE 153*  BUN 43*  CREATININE 1.70*  CALCIUM 9.4  MG 2.1  AST 30  ALT 15  ALKPHOS 80  BILITOT 1.7*    Cardiac Enzymes No results for input(s): TROPONINI in the last 168 hours. RADIOLOGY:  ECHOCARDIOGRAM LIMITED  Result Date: 08/23/2021    ECHOCARDIOGRAM LIMITED REPORT   Patient Name:   Karen Farley Date of Exam: 08/23/2021 Medical Rec #:  [id number]          Height:       62.0 in Accession #:    [id number]         Weight:       189.2 lb Date of Birth:  09-Oct-1946           BSA:          1.867 m Patient Age:    75 years           BP:           107/59 mmHg Patient Gender: F                  HR:           93 bpm. Exam Location:  [facility] Procedure: Limited Echo, Cardiac Doppler and Color Doppler Indications:     pericardial Effusion I31.3  History:  Patient has prior history of Echocardiogram examinations, most                  recent 08/19/2021. Risk Factors:Diabetes, Hypertension and                  Dyslipidemia. CKD stage 3.  Sonographer:     Sherrie Sport Referring Phys:  [id number] Analea Muller Diagnosing Phys: Ida Rogue MD  Sonographer Comments: Suboptimal apical window. IMPRESSIONS  1. Left pleural effusion noted, 5 cm  2. Moderate pericardial effusion, estimated 1.9 off the LV free wall, minimal off the RV free wall (0.4 cm) . There is no evidence of cardiac tamponade.  3. Left ventricular ejection fraction, by estimation, is 60 to 65%. The left ventricle has normal function. The left ventricle has no regional wall motion abnormalities. There is mild left ventricular hypertrophy.  4. Right ventricular systolic function is normal. The right ventricular size is normal.  5. Left atrial size was severely dilated.  6. The mitral valve is normal in structure.  Moderate mitral valve regurgitation. No evidence of mitral stenosis.  7. The aortic valve is normal in structure. Aortic valve regurgitation is not visualized. Mild to moderate aortic valve sclerosis/calcification is present, without any evidence of aortic stenosis. FINDINGS  Left Ventricle: Left ventricular ejection fraction, by estimation, is 60 to 65%. The left ventricle has normal function. The left ventricle has no regional wall motion abnormalities. The left ventricular internal cavity size was normal in size. There is  mild left ventricular hypertrophy. Right Ventricle: The right ventricular size is normal. No increase in right ventricular wall thickness. Right ventricular systolic function is normal. Left Atrium: Left atrial size was severely dilated. Right Atrium: Right atrial size was normal in size. Pericardium: A moderately sized pericardial effusion is present. There is no evidence of cardiac tamponade. Mitral Valve: The mitral valve is normal in structure. Moderate mitral valve regurgitation. No evidence of mitral valve stenosis. Tricuspid Valve: The tricuspid valve is normal in structure. Tricuspid valve regurgitation is not demonstrated. No evidence of tricuspid stenosis. Aortic Valve: The aortic valve is normal in structure. Aortic valve regurgitation is not visualized. Mild to moderate aortic valve sclerosis/calcification is present, without any evidence of aortic stenosis. Pulmonic Valve: The pulmonic valve was normal in structure. Pulmonic valve regurgitation is not visualized. No evidence of pulmonic stenosis. Aorta: The aortic root is normal in size and structure. Venous: The inferior vena cava was not well visualized. The inferior vena cava is normal in size with greater than 50% respiratory variability, suggesting right atrial pressure of 3 mmHg. IAS/Shunts: No atrial level shunt detected by color flow Doppler. LEFT VENTRICLE PLAX 2D LVIDd:         3.70 cm LVIDs:         1.90 cm LV PW:          1.30 cm LV IVS:        1.05 cm  RIGHT VENTRICLE RV Basal diam:  2.70 cm LEFT ATRIUM            Index LA diam:      4.60 cm  2.46 cm/m LA Vol (A4C): 120.0 ml 64.27 ml/m   AORTA Ao Root diam: 2.60 cm MITRAL VALVE                TRICUSPID VALVE MV Area (PHT): 6.71 cm     TR Peak grad:   23.4 mmHg MV Decel Time: 113 msec     TR Vmax:  242.00 cm/s MV E velocity: 132.00 cm/s Ida Rogue MD Electronically signed by Ida Rogue MD Signature Date/Time: 08/23/2021/6:02:48 PM    Final    ASSESSMENT AND PLAN:  75 y.o. WF PMHx HFpEF, permanent atrial fibrillation on Eliquis, PAD, type 2 diabetes, CKD stage IIIb, lymphedema   Presented to hospital with increasing shortness of breath and edema.  Patient has been noticing increasing abdominal distention since her admission in August with severe lower extremity edema and dyspnea and exertion.  Severe Anasarca acute on chronic diastolic heart failure pulmonary hypertension  -status post thoracocentesis with removal of 600 mL of fluid on 07/28/2021. --10/20-- torsemide 20 mg qd with IV albumin --10/21-- IV lqasix 40 mg bid per cardiology --10/22 cont IV lasix--good uop --10/23--IV lasix --10/24-- Dr Juleen China wants to start IV lasxi gtt --10/25-cont Lasix gtt -11/1 decrease Metoprolol 12.5 mg BID per cardiology -11/1 Zaroxolyn 2.5 mg x 1 --11/2--IV lasix gtt + zaroxylyn total17 liters UOP, weight trending down --11/3--cont IV lasix gtt. Get Echo to see status of pericardial effusion --11/4-- continue Lasix drip per Dr. Rockey Situ. He wants to keep patient over the weekend. -- Repeat Echo 11/3-- moderate pericardial effusion. No evidence of tamponade   Large pericardial effusion  -S/p percardiocentesis   Large Pleural effusion s/p thoracentesis Patient underwent a pericardiocentesis on 08/05/2021 with removal of 700 mL of pleural fluid.  Inflammatory markers were unremarkable.  Cardiology monitoring the patient while in the hospital.  Still on  vasopressors due to hypotension. --repeat echo 10/21--no pericaridal effusion   Acute kidney injury on CKD stage 3b(Baseline Cr 1.4-1.7) - Currently at baseline.   --Nephrology followed the patient during hospitalization.  -- Benazepril and spironolactone on hold.   Permanent atrial fibrillation --Was previously on Eliquis.  Currently on hold due to GI bleed.  -- Rate controlled at this time--not on any po rate controlling meds --10/21--started on IV heparin gtt--monitor hgb  --10/22-- hgb remains stable --10/23-- discussed with G.I. Dr. Virgina Jock and cardiology Dr. Rockey Situ plan is to move forward with starting eliquis while patient is in-house to make sure she tolerates. DC heparin drip. Patient is in agreement with plan. --hgb stable  GI bleed with hemorrhagic shock. --Patient was noted to have significant drop in hemoglobin to 9.7 from 12.4 and was transferred to the ICU.   --GI Dr Alice Reichert from 10/19-- May forego endoluminal evaluation for the near future given lack of bleeding recurrence and persistence of cardiopulmonary issues. Patient is in agreement. --10/23-- d/w Dr Russo--no Rectal bleed. Ok to resume po eliquis    DM type II with hyperglycemia Diet controlled at home.   -- Sensitive SSI   Iron deficiency/Vitamin B12  --patient received Venofer -cont oral iron and vitamin B12 supplements.            Procedures: 10/8 s/p left thoracentesis: 677ml aspirated 10/12 paracentesis 1.2L aspirated .  10/16 Echocardiogram performed: Pericardiocentesis 753ml aspirated and drain placed  Failed Lasix IV advanced to Lasix gtt; however, Lasix and Eliquis stopped 10/15 and patient transferred to ICU after had episode of GI bleed with drop in hgb,  s/p 2U pRBC and vasopressor support initiated.   10/21 Echocardiogram LVEF= 60 to 65%.  -Pulmonary HTN: Assumed right atrial pressure of 15 mmHg, the  estimated right ventricular systolic pressure is 12.4 mmHg.  Pericardium: A small  pericardial effusion is present.  10/27 LEFT thoracentesis: Aspirated 700 mL of clear yellow fluid was removed.  DVT prophylaxis: Eliquis  Code Status: Full Family Communication: none todayStatus  is: Inpatient       Dispo: The patient is from: Home              Anticipated d/c is to: Home              Anticipated d/c date is: TBD              Patient currently is not medically stable to d/c.  Per Cardiology needs IV lasix gtt for now       TOTAL TIME TAKING CARE OF THIS PATIENT: 25 minutes.  >50% time spent on counselling and coordination of care  Note: This dictation was prepared with Dragon dictation along with smaller phrase technology. Any transcriptional errors that result from this process are unintentional.  Fritzi Mandes M.D    [facility]   CC: Primary care physician; Valerie Roys, DO Patient ID: Karen Farley, female   DOB: November 18, 1945, 75 y.o.   MRN: [id number]

## 2021-08-24 NOTE — Progress Notes (Signed)
Progress Note  Patient Name: Karen Farley Date of Encounter: 08/24/2021  [facility] Cardiologist: Kate Sable, MD   Subjective   Sitting today with lymphedema compression pumps in place, husband brought him in -Abdomen feels tight, still with significant leg swelling Ace wraps in place -22 L total this admission Reports at home was only taking torsemide 20 daily, was not enough  Inpatient Medications    Scheduled Meds:  apixaban  5 mg Oral BID   Chlorhexidine Gluconate Cloth  6 each Topical Daily   docusate sodium  100 mg Oral Daily   folic acid  1 mg Oral Daily   hydrocortisone   Rectal TID   insulin aspart  0-5 Units Subcutaneous QHS   insulin aspart  0-9 Units Subcutaneous TID WC   iron polysaccharides  150 mg Oral Daily   latanoprost  1 drop Both Eyes QHS   metoprolol succinate  12.5 mg Oral Daily   midodrine  10 mg Oral TID WC   pantoprazole (PROTONIX) IV  40 mg Intravenous Q12H   polyethylene glycol  17 g Oral Daily   potassium chloride  40 mEq Oral TID   Ensure Max Protein  11 oz Oral BID   spironolactone  25 mg Oral Daily   vitamin B-12  500 mcg Oral Daily   Continuous Infusions:  sodium chloride Stopped (08/18/21 0325)   furosemide (LASIX) 200 mg in dextrose 5% 100 mL (2mg /mL) infusion 8 mg/hr (08/23/21 0402)   PRN Meds: acetaminophen, bisacodyl, lidocaine HCl (PF), lip balm, traMADol   Vital Signs    Vitals:   08/24/21 0222 08/24/21 0400 08/24/21 0809 08/24/21 1143  BP:  112/72 122/76 (!) 134/97  Pulse:  80 (!) 106 91  Resp:  19 18 18   Temp:  98.3 F (36.8 C) 97.8 F (36.6 C) 98 F (36.7 C)  TempSrc:  Oral    SpO2:  98% 97% 100%  Weight: 85.7 kg     Height:        Intake/Output Summary (Last 24 hours) at 08/24/2021 1353 Last data filed at 08/24/2021 1045 Gross per 24 hour  Intake 840 ml  Output 2250 ml  Net -1410 ml   Last 3 Weights 08/24/2021 08/23/2021 08/22/2021  Weight (lbs) 188 lb 15 oz 189 lb 3.2 oz 203 lb 11.3 oz   Weight (kg) 85.7 kg 85.821 kg 92.4 kg      Telemetry    Atrial fibrillation rate 90-100 bpm personally Reviewed  ECG    - Personally Reviewed  Physical Exam   Constitutional:  oriented to person, place, and time. No distress. Obese HENT:  Head: Grossly normal Eyes:  no discharge. No scleral icterus.  Neck: Unable to estimate JVD, no carotid bruits  Cardiovascular: Regular rate and rhythm, no murmurs appreciated Lymphedema compression pumps in place bilaterally Pulmonary/Chest: Clear to auscultation bilaterally, no wheezes or rales, dullness at the bases bilaterally Abdominal: Soft.  no distension.  no tenderness.  Musculoskeletal: Normal range of motion Neurological:  normal muscle tone. Coordination normal. No atrophy Skin: Skin warm and dry Psychiatric: normal affect, pleasant   Labs    High Sensitivity Troponin:   Recent Labs  Lab 07/26/21 1708  TROPONINIHS 17     Chemistry Recent Labs  Lab 08/22/21 0610 08/23/21 0800 08/24/21 0748  NA 139 137 138  K 3.5 3.4* 3.5  CL 93* 90* 92*  CO2 36* 37* 36*  GLUCOSE 172* 167* 153*  BUN 45* 49* 43*  CREATININE 1.70* 1.67*  1.70*  CALCIUM 9.6 9.6 9.4  MG 2.1 2.0 2.1  PROT 6.1* 6.5 6.2*  ALBUMIN 3.6 3.7 3.5  AST 22 23 30   ALT 15 15 15   ALKPHOS 78 84 80  BILITOT 2.0* 1.9* 1.7*  GFRNONAA 31* 32* 31*  ANIONGAP 10 10 10     Lipids No results for input(s): CHOL, TRIG, HDL, LABVLDL, LDLCALC, CHOLHDL in the last 168 hours.  Hematology Recent Labs  Lab 08/21/21 0540 08/22/21 0610 08/23/21 0800  WBC 4.5 4.1 4.0  RBC 3.65* 3.71* 3.80*  HGB 9.9* 10.2* 10.4*  HCT 32.8* 32.8* 33.9*  MCV 89.9 88.4 89.2  MCH 27.1 27.5 27.4  MCHC 30.2 31.1 30.7  RDW 25.8* 25.1* 24.5*  PLT 380 365 348   Thyroid No results for input(s): TSH, FREET4 in the last 168 hours.  BNP No results for input(s): BNP, PROBNP in the last 168 hours.   DDimer No results for input(s): DDIMER in the last 168 hours.   Radiology    ECHOCARDIOGRAM  LIMITED  Result Date: 08/23/2021    ECHOCARDIOGRAM LIMITED REPORT   Patient Name:   Karen Farley Date of Exam: 08/23/2021 Medical Rec #:  [id number]          Height:       62.0 in Accession #:    [id number]         Weight:       189.2 lb Date of Birth:  1946/10/01           BSA:          1.867 m Patient Age:    75 years           BP:           107/59 mmHg Patient Gender: F                  HR:           93 bpm. Exam Location:  [facility] Procedure: Limited Echo, Cardiac Doppler and Color Doppler Indications:     pericardial Effusion I31.3  History:         Patient has prior history of Echocardiogram examinations, most                  recent 08/19/2021. Risk Factors:Diabetes, Hypertension and                  Dyslipidemia. CKD stage 3.  Sonographer:     Sherrie Sport Referring Phys:  [id number] SONA PATEL Diagnosing Phys: Ida Rogue MD  Sonographer Comments: Suboptimal apical window. IMPRESSIONS  1. Left pleural effusion noted, 5 cm  2. Moderate pericardial effusion, estimated 1.9 off the LV free wall, minimal off the RV free wall (0.4 cm) . There is no evidence of cardiac tamponade.  3. Left ventricular ejection fraction, by estimation, is 60 to 65%. The left ventricle has normal function. The left ventricle has no regional wall motion abnormalities. There is mild left ventricular hypertrophy.  4. Right ventricular systolic function is normal. The right ventricular size is normal.  5. Left atrial size was severely dilated.  6. The mitral valve is normal in structure. Moderate mitral valve regurgitation. No evidence of mitral stenosis.  7. The aortic valve is normal in structure. Aortic valve regurgitation is not visualized. Mild to moderate aortic valve sclerosis/calcification is present, without any evidence of aortic stenosis. FINDINGS  Left Ventricle: Left ventricular ejection fraction, by estimation, is 60 to 65%. The left ventricle has normal  function. The left ventricle has no regional wall motion abnormalities.  The left ventricular internal cavity size was normal in size. There is  mild left ventricular hypertrophy. Right Ventricle: The right ventricular size is normal. No increase in right ventricular wall thickness. Right ventricular systolic function is normal. Left Atrium: Left atrial size was severely dilated. Right Atrium: Right atrial size was normal in size. Pericardium: A moderately sized pericardial effusion is present. There is no evidence of cardiac tamponade. Mitral Valve: The mitral valve is normal in structure. Moderate mitral valve regurgitation. No evidence of mitral valve stenosis. Tricuspid Valve: The tricuspid valve is normal in structure. Tricuspid valve regurgitation is not demonstrated. No evidence of tricuspid stenosis. Aortic Valve: The aortic valve is normal in structure. Aortic valve regurgitation is not visualized. Mild to moderate aortic valve sclerosis/calcification is present, without any evidence of aortic stenosis. Pulmonic Valve: The pulmonic valve was normal in structure. Pulmonic valve regurgitation is not visualized. No evidence of pulmonic stenosis. Aorta: The aortic root is normal in size and structure. Venous: The inferior vena cava was not well visualized. The inferior vena cava is normal in size with greater than 50% respiratory variability, suggesting right atrial pressure of 3 mmHg. IAS/Shunts: No atrial level shunt detected by color flow Doppler. LEFT VENTRICLE PLAX 2D LVIDd:         3.70 cm LVIDs:         1.90 cm LV PW:         1.30 cm LV IVS:        1.05 cm  RIGHT VENTRICLE RV Basal diam:  2.70 cm LEFT ATRIUM            Index LA diam:      4.60 cm  2.46 cm/m LA Vol (A4C): 120.0 ml 64.27 ml/m   AORTA Ao Root diam: 2.60 cm MITRAL VALVE                TRICUSPID VALVE MV Area (PHT): 6.71 cm     TR Peak grad:   23.4 mmHg MV Decel Time: 113 msec     TR Vmax:        242.00 cm/s MV E velocity: 132.00 cm/s Ida Rogue MD Electronically signed by Ida Rogue MD Signature  Date/Time: 08/23/2021/6:02:48 PM    Final     Cardiac Studies   Echo  1. Left ventricular ejection fraction, by estimation, is 60 to 65%. The  left ventricle has normal function. There is mild left ventricular  hypertrophy.   2. Right ventricular systolic function is normal. The right ventricular  size is normal. There is severely elevated pulmonary artery systolic  pressure.   3. A small pericardial effusion is present. The pericardial effusion is  posterior to the left ventricle and localized near the right atrium.   4. The aortic valve has an indeterminant number of cusps. There is  moderate calcification of the aortic valve. There is moderate thickening  of the aortic valve.   5. The inferior vena cava is dilated in size with <50% respiratory  variability, suggesting right atrial pressure of 15 mmHg.    Patient Profile     75 y.o. female with history of HFpEF, hypertension, CKD 3, permanent atrial fibrillation presenting with hypotension, GI bleed and large pericardial effusion s/p pericardial drainage placement.  Pericardial effusion, hemorrhagic pericardiocentesis and drain placement on arrival to the hospital  hemorrhagic effusion noted -Pericardial drainage tube has been removed Initial echo with no reaccumulation, echocardiogram performed yesterday  with moderate eccentric effusion with no tamponade -We will need periodic outpatient echocardiography Appears to be slowly improving with aggressive diuresis   2.  HFpEF, massive volume overload Presenting with Anasarca arms legs abdomen Has had aggressive Lasix throughout her hospital course, -Continues on Lasix infusion, -22 L -Renal function stable, nephrology following -Still with abdominal distention, pleural effusion, leg swelling -Agree with lymphedema compression pumps, continued Lasix infusion --At home was on torsemide 20 daily, will likely need torsemide 40 twice daily Also discussed metolazone 2.5 up to 5 mg for  significant weight gain not relieved with torsemide  3.  Permanent A. Fib Through much of her hospital course, low blood pressure limiting titration of medication Tolerating metoprolol, requiring midodrine   4.  Hypotension/shock In the setting of presumed lower GI bleed, "blow out" several weeks ago Initially placed on pressors on arrival, this has since been weaned, hemodynamically stable since that time Has continued to require midodrine 3 times daily -Blood pressure low but stable  5.  Lymphedema Recommend lymphedema compression pumps 1 hour twice a day  6.  Pleural effusion Noted to be around 5 cm on echocardiogram performed yesterday Also with pericardial effusion Clinical signs consistent with fluid overload/hypervolemia -Would continue Lasix infusion, would likely be best to treat over the weekend  Long discussion with her concerning need for lymphedema compression pumps, continue diuresis, Management of her diuretics at home  Total encounter time more than 35 minutes  Greater than 50% was spent in counseling and coordination of care with the patient   For questions or updates, please contact [place] Please consult [email or web address] for contact info under        Signed, Ida Rogue, MD  08/24/2021, 1:53 PM

## 2021-08-25 DIAGNOSIS — I5031 Acute diastolic (congestive) heart failure: Secondary | ICD-10-CM | POA: Diagnosis not present

## 2021-08-25 DIAGNOSIS — I4821 Permanent atrial fibrillation: Secondary | ICD-10-CM | POA: Diagnosis not present

## 2021-08-25 LAB — PHOSPHORUS: Phosphorus: 3.4 mg/dL (ref 2.5–4.6)

## 2021-08-25 LAB — GLUCOSE, CAPILLARY
Glucose-Capillary: 170 mg/dL — ABNORMAL HIGH (ref 70–99)
Glucose-Capillary: 279 mg/dL — ABNORMAL HIGH (ref 70–99)
Glucose-Capillary: 202 mg/dL — ABNORMAL HIGH (ref 70–99)
Glucose-Capillary: 272 mg/dL — ABNORMAL HIGH (ref 70–99)

## 2021-08-25 LAB — COMPREHENSIVE METABOLIC PANEL
ALT: 14 U/L (ref 0–44)
AST: 26 U/L (ref 15–41)
Albumin: 3.6 g/dL (ref 3.5–5.0)
Alkaline Phosphatase: 84 U/L (ref 38–126)
Anion gap: 11 (ref 5–15)
BUN: 46 mg/dL — ABNORMAL HIGH (ref 8–23)
CO2: 35 mmol/L — ABNORMAL HIGH (ref 22–32)
Calcium: 9.3 mg/dL (ref 8.9–10.3)
Chloride: 90 mmol/L — ABNORMAL LOW (ref 98–111)
Creatinine, Ser: 1.55 mg/dL — ABNORMAL HIGH (ref 0.44–1.00)
GFR, Estimated: 35 mL/min — ABNORMAL LOW (ref >60)
Glucose, Bld: 170 mg/dL — ABNORMAL HIGH (ref 70–99)
Potassium: 3.4 mmol/L — ABNORMAL LOW (ref 3.5–5.1)
Sodium: 136 mmol/L (ref 135–145)
Total Bilirubin: 1.6 mg/dL — ABNORMAL HIGH (ref 0.3–1.2)
Total Protein: 6.2 g/dL — ABNORMAL LOW (ref 6.5–8.1)

## 2021-08-25 LAB — MAGNESIUM: Magnesium: 2.2 mg/dL (ref 1.7–2.4)

## 2021-08-25 MED ORDER — METOLAZONE 2.5 MG PO TABS
2.5000 mg | ORAL_TABLET | Freq: Every day | ORAL | Status: DC
  Administered 2021-08-25 – 2021-08-27 (×3): 2.5 mg via ORAL
  Filled 2021-08-25 (×3): qty 1

## 2021-08-25 NOTE — Progress Notes (Signed)
[facility] NAME: Karen Farley    MR#:  [id number]  DATE OF BIRTH:  Jun 28, 1946  SUBJECTIVE:   Sitting in the chair.Overall doing well. Able to get up from the bed by herself.  REVIEW OF SYSTEMS:   Review of Systems  Constitutional:  Negative for chills, fever and weight loss.  HENT:  Negative for ear discharge, ear pain and nosebleeds.   Eyes:  Negative for blurred vision, pain and discharge.  Respiratory:  Negative for sputum production, shortness of breath, wheezing and stridor.   Cardiovascular:  Positive for leg swelling. Negative for chest pain, palpitations, orthopnea and PND.  Gastrointestinal:  Negative for abdominal pain, diarrhea, nausea and vomiting.  Genitourinary:  Negative for frequency and urgency.  Musculoskeletal:  Negative for back pain and joint pain.  Neurological:  Positive for weakness. Negative for sensory change, speech change and focal weakness.  Psychiatric/Behavioral:  Negative for depression and hallucinations. The patient is not nervous/anxious.   Tolerating Diet: Tolerating PT: HHPT  DRUG ALLERGIES:  No Known Allergies  VITALS:  Blood pressure 105/77, pulse 88, temperature 97.9 F (36.6 C), resp. rate 18, height 5\' 2"  (1.575 m), weight 89.6 kg, SpO2 100 %.  PHYSICAL EXAMINATION:    GENERAL:  75 y.o.-year-old patient lying in the bed with no acute distress. obese HEENT: Head atraumatic, normocephalic. Oropharynx and nasopharynx clear.  NECK:  Supple, no jugular venous distention. No thyroid enlargement, no tenderness.  LUNGS: decreased breath sounds bilaterally, no wheezing, rales, rhonchi. No use of accessory muscles of respiration.  CARDIOVASCULAR: S1, S2 normal. No murmurs, rubs, or gallops.  ABDOMEN: Soft, nontender, nondistended. Bowel sounds present. No organomegaly or mass.  EXTREMITIES: Bilateral edema + NEUROLOGIC: non-focal PSYCHIATRIC:  patient is alert and oriented x 3.  SKIN:  No obvious rash, lesion, or ulcer.   LABORATORY PANEL:  CBC Recent Labs  Lab 08/23/21 0800  WBC 4.0  HGB 10.4*  HCT 33.9*  PLT 348     Chemistries  Recent Labs  Lab 08/25/21 0500  NA 136  K 3.4*  CL 90*  CO2 35*  GLUCOSE 170*  BUN 46*  CREATININE 1.55*  CALCIUM 9.3  MG 2.2  AST 26  ALT 14  ALKPHOS 84  BILITOT 1.6*    Cardiac Enzymes No results for input(s): TROPONINI in the last 168 hours. RADIOLOGY:  ECHOCARDIOGRAM LIMITED  Result Date: 08/23/2021    ECHOCARDIOGRAM LIMITED REPORT   Patient Name:   Karen Farley Date of Exam: 08/23/2021 Medical Rec #:  [id number]          Height:       62.0 in Accession #:    [id number]         Weight:       189.2 lb Date of Birth:  06/18/1946           BSA:          1.867 m Patient Age:    1 years           BP:           107/59 mmHg Patient Gender: F                  HR:           93 bpm. Exam Location:  [facility] Procedure: Limited Echo, Cardiac Doppler and Color Doppler Indications:     pericardial Effusion I31.3  History:  Patient has prior history of Echocardiogram examinations, most                  recent 08/19/2021. Risk Factors:Diabetes, Hypertension and                  Dyslipidemia. CKD stage 3.  Sonographer:     Sherrie Sport Referring Phys:  [id number]ettendorf Diagnosing Phys: Karen Rogue MD  Sonographer Comments: Suboptimal apical window. IMPRESSIONS  1. Left pleural effusion noted, 5 cm  2. Moderate pericardial effusion, estimated 1.9 off the LV free wall, minimal off the RV free wall (0.4 cm) . There is no evidence of cardiac tamponade.  3. Left ventricular ejection fraction, by estimation, is 60 to 65%. The left ventricle has normal function. The left ventricle has no regional wall motion abnormalities. There is mild left ventricular hypertrophy.  4. Right ventricular systolic function is normal. The right ventricular size is normal.  5. Left atrial size was severely dilated.  6. The mitral valve is normal in structure.  Moderate mitral valve regurgitation. No evidence of mitral stenosis.  7. The aortic valve is normal in structure. Aortic valve regurgitation is not visualized. Mild to moderate aortic valve sclerosis/calcification is present, without any evidence of aortic stenosis. FINDINGS  Left Ventricle: Left ventricular ejection fraction, by estimation, is 60 to 65%. The left ventricle has normal function. The left ventricle has no regional wall motion abnormalities. The left ventricular internal cavity size was normal in size. There is  mild left ventricular hypertrophy. Right Ventricle: The right ventricular size is normal. No increase in right ventricular wall thickness. Right ventricular systolic function is normal. Left Atrium: Left atrial size was severely dilated. Right Atrium: Right atrial size was normal in size. Pericardium: A moderately sized pericardial effusion is present. There is no evidence of cardiac tamponade. Mitral Valve: The mitral valve is normal in structure. Moderate mitral valve regurgitation. No evidence of mitral valve stenosis. Tricuspid Valve: The tricuspid valve is normal in structure. Tricuspid valve regurgitation is not demonstrated. No evidence of tricuspid stenosis. Aortic Valve: The aortic valve is normal in structure. Aortic valve regurgitation is not visualized. Mild to moderate aortic valve sclerosis/calcification is present, without any evidence of aortic stenosis. Pulmonic Valve: The pulmonic valve was normal in structure. Pulmonic valve regurgitation is not visualized. No evidence of pulmonic stenosis. Aorta: The aortic root is normal in size and structure. Venous: The inferior vena cava was not well visualized. The inferior vena cava is normal in size with greater than 50% respiratory variability, suggesting right atrial pressure of 3 mmHg. IAS/Shunts: No atrial level shunt detected by color flow Doppler. LEFT VENTRICLE PLAX 2D LVIDd:         3.70 cm LVIDs:         1.90 cm LV PW:          1.30 cm LV IVS:        1.05 cm  RIGHT VENTRICLE RV Basal diam:  2.70 cm LEFT ATRIUM            Index LA diam:      4.60 cm  2.46 cm/m LA Vol (A4C): 120.0 ml 64.27 ml/m   AORTA Ao Root diam: 2.60 cm MITRAL VALVE                TRICUSPID VALVE MV Area (PHT): 6.71 cm     TR Peak grad:   23.4 mmHg MV Decel Time: 113 msec     TR Vmax:  242.00 cm/s MV E velocity: 132.00 cm/s Karen Rogue MD Electronically signed by Karen Rogue MD Signature Date/Time: 08/23/2021/6:02:48 PM    Final    ASSESSMENT AND PLAN:  75 y.o. WF PMHx HFpEF, permanent atrial fibrillation on Eliquis, PAD, type 2 diabetes, CKD stage IIIb, lymphedema   Presented to hospital with increasing shortness of breath and edema.  Patient has been noticing increasing abdominal distention since her admission in August with severe lower extremity edema and dyspnea and exertion.  Severe Anasarca acute on chronic diastolic heart failure pulmonary hypertension  -status post thoracocentesis with removal of 600 mL of fluid on 07/28/2021. --10/20-- torsemide 20 mg qd with IV albumin --10/21-- IV lqasix 40 mg bid per cardiology --10/22 cont IV lasix--good uop --10/23--IV lasix --10/24-- Dr Juleen China wants to start IV lasxi gtt --10/25-cont Lasix gtt -11/1 decrease Metoprolol 12.5 mg BID per cardiology -11/1 Zaroxolyn 2.5 mg x 1 --11/2--IV lasix gtt + zaroxylyn total17 liters UOP, weight trending down --11/3--cont IV lasix gtt. Get Echo to see status of pericardial effusion --11/4-- continue Lasix drip per Dr. Rockey Situ. He wants to keep patient over the weekend. -- Repeat Echo 11/3-- moderate pericardial effusion. No evidence of tamponade --11/5 Lasix gtt + zaroxylyn 2.5 x1   Large pericardial effusion  -S/p percardiocentesis x1 10/16   Large Pleural effusion s/p thoracentesis Patient underwent a pericardiocentesis on 08/05/2021 with removal of 700 mL of pleural fluid.  Inflammatory markers were unremarkable.  Cardiology monitoring the  patient while in the hospital.  Still on vasopressors due to hypotension. --repeat echo 10/21--no pericaridal effusion   Acute kidney injury on CKD stage 3b(Baseline Cr 1.4-1.7) - Currently at baseline.   --Nephrology followed the patient during hospitalization.  -- Benazepril and spironolactone on hold.   Permanent atrial fibrillation --Was previously on Eliquis.  Currently on hold due to GI bleed.  -- Rate controlled at this time--not on any po rate controlling meds --10/21--started on IV heparin gtt--monitor hgb  --10/22-- hgb remains stable --10/23-- discussed with G.I. Dr. Virgina Jock and cardiology Dr. Rockey Situ plan is to move forward with starting eliquis while patient is in-house to make sure she tolerates. DC heparin drip. Patient is in agreement with plan. --hgb stable  GI bleed with hemorrhagic shock. --Patient was noted to have significant drop in hemoglobin to 9.7 from 12.4 and was transferred to the ICU.   --GI Dr Alice Reichert from 10/19-- May forego endoluminal evaluation for the near future given lack of bleeding recurrence and persistence of cardiopulmonary issues. Patient is in agreement. --10/23-- d/w Dr Russo--no Rectal bleed. Ok to resume po eliquis    DM type II with hyperglycemia Diet controlled at home.   -- Sensitive SSI   Iron deficiency/Vitamin B12  --patient received Venofer -cont oral iron and vitamin B12 supplements.            Procedures: 10/8 s/p left thoracentesis: 638ml aspirated 10/12 paracentesis 1.2L aspirated .  10/16 Echocardiogram performed: Pericardiocentesis 755ml aspirated and drain placed  Failed Lasix IV advanced to Lasix gtt; however, Lasix and Eliquis stopped 10/15 and patient transferred to ICU after had episode of GI bleed with drop in hgb,  s/p 2U pRBC and vasopressor support initiated.   10/21 Echocardiogram LVEF= 60 to 65%.  -Pulmonary HTN: Assumed right atrial pressure of 15 mmHg, the  estimated right ventricular systolic pressure is  90.3 mmHg.  Pericardium: A small pericardial effusion is present.  10/27 LEFT thoracentesis: Aspirated 700 mL of clear yellow fluid was removed.  DVT prophylaxis:  Eliquis  Code Status: Full Family Communication: none todayStatus is: Inpatient       Dispo: The patient is from: Home              Anticipated d/c is to: Home              Anticipated d/c date is: TBD              Patient currently is not medically stable to d/c.  Per Cardiology needs IV lasix gtt for now       TOTAL TIME TAKING CARE OF THIS PATIENT: 25 minutes.  >50% time spent on counselling and coordination of care  Note: This dictation was prepared with Dragon dictation along with smaller phrase technology. Any transcriptional errors that result from this process are unintentional.  Fritzi Mandes M.D    [facility]   CC: Primary care physician; Valerie Roys, DO Patient ID: Karen Farley, female   DOB: November 15, 1945, 74 y.o.   MRN: [id number]

## 2021-08-25 NOTE — Progress Notes (Signed)
[facility]  ROUNDING NOTE   Subjective:   [name] was admitted to [facility] on 07/26/2021 for Morbid obesity ([place]) [E66.01] DOE (dyspnea on exertion) [R06.09] Recurrent left pleural effusion [J90] Hypotension [I95.9] Type 2 diabetes mellitus without complication, without long-term current use of insulin ([place]) [E11.9] Atrial fibrillation, unspecified type ([place]) [I48.91] Chronic congestive heart failure, unspecified heart failure type ([place]) [I50.9]   Patient was seen today on the second floor Patient was comfortably sitting on the chair in the room Patient informed me that she was feeling much better Patient offers no new specific physical complaints  Objective:  Vital signs in last 24 hours:  Temp:  [97.7 F (36.5 C)-98.6 F (37 C)] 98.4 F (36.9 C) (11/05 0730) Pulse Rate:  [82-94] 82 (11/05 0730) Resp:  [17-20] 17 (11/05 0730) BP: (95-134)/(66-97) 97/73 (11/05 0730) SpO2:  [9 %-100 %] 96 % (11/05 0730) Weight:  [89.6 kg] 89.6 kg (11/05 0526)  Weight change: 3.9 kg Filed Weights   08/23/21 0500 08/24/21 0222 08/25/21 0526  Weight: 85.8 kg 85.7 kg 89.6 kg    Intake/Output: I/O last 3 completed shifts: In: 70 [P.O.:960] Out: 7353 [Urine:4795]   Intake/Output this shift:  No intake/output data recorded.  Physical Exam: General: In no acute distress, sitting in chair  Head: oral mucosal membranes moist  Lungs:  Clear bilaterally, normal effort  Heart: S1S2, irregular  Abdomen:  Soft, Non tender  Extremities:  1+ peripheral edema. Anasarca + bandages bilaterally  Neurologic: Awake, alert, able to answer questions appropriately  Skin: No acute lesions or rashes        Basic Metabolic Panel: Recent Labs  Lab 08/21/21 0540 08/22/21 0610 08/23/21 0800 08/24/21 0748 08/25/21 0500  NA 138 139 137 138 136  K 3.8 3.5 3.4* 3.5 3.4*  CL 96* 93* 90* 92* 90*  CO2 34* 36* 37* 36* 35*  GLUCOSE 178* 172* 167* 153* 170*  BUN 43* 45* 49* 43*  46*  CREATININE 1.67* 1.70* 1.67* 1.70* 1.55*  CALCIUM 9.4 9.6 9.6 9.4 9.3  MG 2.1 2.1 2.0 2.1 2.2  PHOS 2.7 3.1 3.8 3.8 3.4    Liver Function Tests: Recent Labs  Lab 08/21/21 0540 08/22/21 0610 08/23/21 0800 08/24/21 0748 08/25/21 0500  AST 20 22 23 30 26   ALT 14 15 15 15 14   ALKPHOS 77 78 84 80 84  BILITOT 2.0* 2.0* 1.9* 1.7* 1.6*  PROT 6.1* 6.1* 6.5 6.2* 6.2*  ALBUMIN 3.6 3.6 3.7 3.5 3.6   No results for input(s): LIPASE, AMYLASE in the last 168 hours. No results for input(s): AMMONIA in the last 168 hours.  CBC: Recent Labs  Lab 08/19/21 0635 08/20/21 0650 08/21/21 0540 08/22/21 0610 08/23/21 0800  WBC 6.2 5.7 4.5 4.1 4.0  NEUTROABS  --   --  3.2 2.7 2.7  HGB 9.6* 9.6* 9.9* 10.2* 10.4*  HCT 29.6* 30.1* 32.8* 32.8* 33.9*  MCV 88.9 89.1 89.9 88.4 89.2  PLT 351 356 380 365 348    Cardiac Enzymes: No results for input(s): CKTOTAL, CKMB, CKMBINDEX, TROPONINI in the last 168 hours.  BNP: Invalid input(s): POCBNP  CBG: Recent Labs  Lab 08/24/21 0809 08/24/21 1143 08/24/21 1615 08/24/21 2040 08/25/21 0810  GLUCAP 149* 226* 211* 250* 170*    Microbiology: Results for orders placed or performed during the hospital encounter of 07/26/21  Resp Panel by RT-PCR (Flu A&B, Covid) Nasopharyngeal Swab     Status: None   Collection Time: 07/26/21  7:19 PM  Specimen: Nasopharyngeal Swab; Nasopharyngeal(NP) swabs in vial transport medium  Result Value Ref Range Status   SARS Coronavirus 2 by RT PCR NEGATIVE NEGATIVE Final    Comment: (NOTE) SARS-CoV-2 target nucleic acids are NOT DETECTED.  The SARS-CoV-2 RNA is generally detectable in upper respiratory specimens during the acute phase of infection. The lowest concentration of SARS-CoV-2 viral copies this assay can detect is 138 copies/mL. A negative result does not preclude SARS-Cov-2 infection and should not be used as the sole basis for treatment or other patient management decisions. A negative result may  occur with  improper specimen collection/handling, submission of specimen other than nasopharyngeal swab, presence of viral mutation(s) within the areas targeted by this assay, and inadequate number of viral copies(<138 copies/mL). A negative result must be combined with clinical observations, patient history, and epidemiological information. The expected result is Negative.  Fact Sheet for Patients:  EntrepreneurPulse.com.au  Fact Sheet for Healthcare Providers:  IncredibleEmployment.be  This test is no t yet approved or cleared by the [place] FDA and  has been authorized for detection and/or diagnosis of SARS-CoV-2 by FDA under an Emergency Use Authorization (EUA). This EUA will remain  in effect (meaning this test can be used) for the duration of the COVID-19 declaration under Section 564(b)(1) of the Act, 21 U.S.C.section 360bbb-3(b)(1), unless the authorization is terminated  or revoked sooner.       Influenza A by PCR NEGATIVE NEGATIVE Final   Influenza B by PCR NEGATIVE NEGATIVE Final    Comment: (NOTE) The Xpert Xpress SARS-CoV-2/FLU/RSV plus assay is intended as an aid in the diagnosis of influenza from Nasopharyngeal swab specimens and should not be used as a sole basis for treatment. Nasal washings and aspirates are unacceptable for Xpert Xpress SARS-CoV-2/FLU/RSV testing.  Fact Sheet for Patients: EntrepreneurPulse.com.au  Fact Sheet for Healthcare Providers: IncredibleEmployment.be  This test is not yet approved or cleared by the [place] FDA and has been authorized for detection and/or diagnosis of SARS-CoV-2 by FDA under an Emergency Use Authorization (EUA). This EUA will remain in effect (meaning this test can be used) for the duration of the COVID-19 declaration under Section 564(b)(1) of the Act, 21 U.S.C. section 360bbb-3(b)(1), unless the authorization is terminated  or revoked.  Performed at [facility], [address] [place], [place] [address]   Body fluid culture w Gram Stain     Status: None   Collection Time: 07/28/21  1:53 PM   Specimen: PATH Cytology Pleural fluid  Result Value Ref Range Status   Specimen Description   Final    PLEURAL Performed at [facility], [address] [place], [place] [address]    Special Requests   Final    NONE Performed at [facility], [address] [place], [place] [address]    Gram Stain   Final    NO SQUAMOUS EPITHELIAL CELLS SEEN FEW WBC SEEN NO ORGANISMS SEEN    Culture   Final    NO GROWTH 3 DAYS Performed at [facility], [place] [address], [place], [place] [address]    Report Status 08/01/2021 FINAL  Final  Body fluid culture w Gram Stain     Status: None   Collection Time: 08/01/21  3:36 PM   Specimen: PATH Cytology Peritoneal fluid  Result Value Ref Range Status   Specimen Description   Final    PERITONEAL Performed at [facility], [address], [place], [place] [address]    Special  Requests   Final    NONE Performed at [facility], [place], [place] [address]    Gram Stain   Final    RARE WBC PRESENT,BOTH PMN AND MONONUCLEAR NO ORGANISMS SEEN    Culture   Final    NO GROWTH 3 DAYS Performed at [facility], [place] [address], [place], [place] [address]    Report Status 08/05/2021 FINAL  Final  MRSA Next Gen by PCR, Nasal     Status: None   Collection Time: 08/04/21  8:56 PM   Specimen: Nasal Mucosa; Nasal Swab  Result Value Ref Range Status   MRSA by PCR Next Gen NOT DETECTED NOT DETECTED Final    Comment: (NOTE) The GeneXpert MRSA Assay (FDA approved for NASAL specimens only), is one component of a comprehensive MRSA colonization surveillance program. It is not intended to diagnose MRSA infection nor to guide or monitor treatment for MRSA infections. Test performance is not FDA approved in  patients less than 76 years old. Performed at [facility], [address] [place], [place] [address]   Body fluid culture w Gram Stain     Status: None   Collection Time: 08/05/21  5:50 PM   Specimen: Pericardial  Result Value Ref Range Status   Specimen Description   Final    PERICARDIAL Performed at [facility], [address] [place], [place] [address]    Special Requests   Final    NONE Performed at [facility], [address] [place], [place] [address]    Gram Stain   Final    FEW WBC PRESENT, PREDOMINANTLY MONONUCLEAR NO ORGANISMS SEEN    Culture   Final    NO GROWTH 3 DAYS Performed at [facility], [place] [address], [place], [place] [address]    Report Status 08/09/2021 FINAL  Final  Fungus Culture With Stain     Status: None (Preliminary result)   Collection Time: 08/16/21 11:46 AM   Specimen: PATH Cytology Pleural fluid  Result Value Ref Range Status   Fungus Stain Final report  Final    Comment: (NOTE) Performed At: [facility] [place], [place] [id number] Rush Farmer MD OE:[id number]    Fungus (Mycology) Culture PENDING  Incomplete   Fungal Source PLEURAL  Final    Comment: Performed at [facility], [address] [place], [place] [address]  Acid Fast Smear (AFB)     Status: None   Collection Time: 08/16/21 11:46 AM   Specimen: PATH Cytology Pleural fluid  Result Value Ref Range Status   AFB Specimen Processing Concentration  Final   Acid Fast Smear Negative  Final    Comment: (NOTE) Performed At: [facility] [place], [place] [id number] Rush Farmer MD YB:[id number]    Source (AFB) PLEURAL  Final    Comment: Performed at [facility], [address] [place], [place] [address]  Body fluid culture w Gram Stain     Status: None   Collection Time: 08/16/21 11:46 AM   Specimen: PATH Cytology Pleural fluid  Result Value Ref Range Status    Specimen Description   Final    PLEURAL Performed at [facility], [address], [place], [place] [address]    Special Requests   Final    NONE Performed at [facility], [address], [place],  [address]    Gram Stain   Final  WBC PRESENT, PREDOMINANTLY MONONUCLEAR NO ORGANISMS SEEN CYTOSPIN SMEAR    Culture   Final    NO GROWTH 3 DAYS Performed at [facility], [place] [address], [place], [place] [address]    Report Status 08/19/2021 FINAL  Final  Fungus Culture Result     Status: None   Collection Time: 08/16/21 11:46 AM  Result Value Ref Range Status   Result 1 Comment  Final    Comment: (NOTE) KOH/Calcofluor preparation:  no fungus observed. Performed At: [facility] [place], [place] [id number] Rush Farmer MD AT:[id number]     Coagulation Studies: No results for input(s): LABPROT, INR in the last 72 hours.   Urinalysis: No results for input(s): COLORURINE, LABSPEC, PHURINE, GLUCOSEU, HGBUR, BILIRUBINUR, KETONESUR, PROTEINUR, UROBILINOGEN, NITRITE, LEUKOCYTESUR in the last 72 hours.  Invalid input(s): APPERANCEUR    Imaging: ECHOCARDIOGRAM LIMITED  Result Date: 08/23/2021    ECHOCARDIOGRAM LIMITED REPORT   Patient Name:   Karen Farley Date of Exam: 08/23/2021 Medical Rec #:  [id number]          Height:       62.0 in Accession #:    [id number]         Weight:       189.2 lb Date of Birth:  09-03-1946           BSA:          1.867 m Patient Age:    10 years           BP:           107/59 mmHg Patient Gender: F                  HR:           93 bpm. Exam Location:  [facility] Procedure: Limited Echo, Cardiac Doppler and Color Doppler Indications:     pericardial Effusion I31.3  History:         Patient has prior history of Echocardiogram examinations, most                  recent 08/19/2021. Risk Factors:Diabetes, Hypertension and                  Dyslipidemia. CKD stage 3.  Sonographer:     Sherrie Sport Referring  Phys:  [id number] SONA PATEL Diagnosing Phys: Ida Rogue MD  Sonographer Comments: Suboptimal apical window. IMPRESSIONS  1. Left pleural effusion noted, 5 cm  2. Moderate pericardial effusion, estimated 1.9 off the LV free wall, minimal off the RV free wall (0.4 cm) . There is no evidence of cardiac tamponade.  3. Left ventricular ejection fraction, by estimation, is 60 to 65%. The left ventricle has normal function. The left ventricle has no regional wall motion abnormalities. There is mild left ventricular hypertrophy.  4. Right ventricular systolic function is normal. The right ventricular size is normal.  5. Left atrial size was severely dilated.  6. The mitral valve is normal in structure. Moderate mitral valve regurgitation. No evidence of mitral stenosis.  7. The aortic valve is normal in structure. Aortic valve regurgitation is not visualized. Mild to moderate aortic valve sclerosis/calcification is present, without any evidence of aortic stenosis. FINDINGS  Left Ventricle: Left ventricular ejection fraction, by estimation, is 60 to 65%. The left ventricle has normal function. The left ventricle has no regional wall motion abnormalities. The left ventricular internal cavity size was normal in size. There is  mild  left ventricular hypertrophy. Right Ventricle: The right ventricular size is normal. No increase in right ventricular wall thickness. Right ventricular systolic function is normal. Left Atrium: Left atrial size was severely dilated. Right Atrium: Right atrial size was normal in size. Pericardium: A moderately sized pericardial effusion is present. There is no evidence of cardiac tamponade. Mitral Valve: The mitral valve is normal in structure. Moderate mitral valve regurgitation. No evidence of mitral valve stenosis. Tricuspid Valve: The tricuspid valve is normal in structure. Tricuspid valve regurgitation is not demonstrated. No evidence of tricuspid stenosis. Aortic Valve: The aortic valve is  normal in structure. Aortic valve regurgitation is not visualized. Mild to moderate aortic valve sclerosis/calcification is present, without any evidence of aortic stenosis. Pulmonic Valve: The pulmonic valve was normal in structure. Pulmonic valve regurgitation is not visualized. No evidence of pulmonic stenosis. Aorta: The aortic root is normal in size and structure. Venous: The inferior vena cava was not well visualized. The inferior vena cava is normal in size with greater than 50% respiratory variability, suggesting right atrial pressure of 3 mmHg. IAS/Shunts: No atrial level shunt detected by color flow Doppler. LEFT VENTRICLE PLAX 2D LVIDd:         3.70 cm LVIDs:         1.90 cm LV PW:         1.30 cm LV IVS:        1.05 cm  RIGHT VENTRICLE RV Basal diam:  2.70 cm LEFT ATRIUM            Index LA diam:      4.60 cm  2.46 cm/m LA Vol (A4C): 120.0 ml 64.27 ml/m   AORTA Ao Root diam: 2.60 cm MITRAL VALVE                TRICUSPID VALVE MV Area (PHT): 6.71 cm     TR Peak grad:   23.4 mmHg MV Decel Time: 113 msec     TR Vmax:        242.00 cm/s MV E velocity: 132.00 cm/s Ida Rogue MD Electronically signed by Ida Rogue MD Signature Date/Time: 08/23/2021/6:02:48 PM    Final      Medications:    sodium chloride Stopped (08/18/21 0325)   furosemide (LASIX) 200 mg in dextrose 5% 100 mL (2mg /mL) infusion 8 mg/hr (08/25/21 0347)    apixaban  5 mg Oral BID   Chlorhexidine Gluconate Cloth  6 each Topical Daily   docusate sodium  100 mg Oral Daily   folic acid  1 mg Oral Daily   hydrocortisone   Rectal TID   insulin aspart  0-5 Units Subcutaneous QHS   insulin aspart  0-9 Units Subcutaneous TID WC   iron polysaccharides  150 mg Oral Daily   latanoprost  1 drop Both Eyes QHS   metoprolol succinate  12.5 mg Oral Daily   midodrine  10 mg Oral TID WC   pantoprazole (PROTONIX) IV  40 mg Intravenous Q12H   polyethylene glycol  17 g Oral Daily   potassium chloride  40 mEq Oral TID   Ensure Max  Protein  11 oz Oral BID   spironolactone  25 mg Oral Daily   vitamin B-12  500 mcg Oral Daily     Assessment/ Plan:  Ms. ZARA WENDT is a 75 y.o. white female with diastolic congestive heart failure, hypertension, hyperlipidemia, lymphedema, atrial fibrillation, peripheral vascular disease who is admitted to [facility] on 07/26/2021 for Morbid obesity ([place]) [E66.01] DOE (  dyspnea on exertion) [R06.09] Recurrent left pleural effusion [J90] Hypotension [I95.9] Type 2 diabetes mellitus without complication, without long-term current use of insulin ([facility]) [E11.9] Atrial fibrillation, unspecified type ([place]) [I48.91] Chronic congestive heart failure, unspecified heart failure type ([place]) [I50.9]       1)Renal    AKI Patient has AKI secondary to ATN Patient AKI is now better Patient had a peak creatinine of 2.0 Patient creatinine now stable at 1.5 Patient had AKI on CKD Patient had CKD going back to 2016 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 Patient was admitted in June at that time patient had a peak creatinine of nearly 5.0 Patient then admitted again in August at that time patient had a peak creatinine of 1.8 Patient admitted again in October/now November as patient has been inpatient for past 30 days  2)HTN  Blood pressure is stable  Patient was hypotensive earlier and patient was started on midodrine  3)Anemia of chronic disease  CBC Latest Ref Rng & Units 08/23/2021 08/22/2021 08/21/2021  WBC 4.0 - 10.5 K/uL 4.0 4.1 4.5  Hemoglobin 12.0 - 15.0 g/dL 10.4(L) 10.2(L) 9.9(L)  Hematocrit 36.0 - 46.0 % 33.9(L) 32.8(L) 32.8(L)  Platelets 150 - 400 K/uL 348 365 380       HGb at goal (9--11)   4) acute on chronic diastolic CHF Patient is currently negative by 33--34 l since admission Patient is currently on IV Lasix drip at 8 mg/h   5) hemorrhagic pericardial effusion Patient is  status post pericardiocentesis    6) Electrolytes   BMP Latest Ref Rng & Units 08/25/2021 08/24/2021 08/23/2021  Glucose 70 - 99 mg/dL 170(H) 153(H) 167(H)  BUN 8 - 23 mg/dL 46(H) 43(H) 49(H)  Creatinine 0.44 - 1.00 mg/dL 1.55(H) 1.70(H) 1.67(H)  BUN/Creat Ratio 12 - 28 - - -  Sodium 135 - 145 mmol/L 136 138 137  Potassium 3.5 - 5.1 mmol/L 3.4(L) 3.5 3.4(L)  Chloride 98 - 111 mmol/L 90(L) 92(L) 90(L)  CO2 22 - 32 mmol/L 35(H) 36(H) 37(H)  Calcium 8.9 - 10.3 mg/dL 9.3 9.4 9.6     Sodium Normonatremic   Potassium Hypokalemia Patient is hypokalemic secondary to IV diuresis Patient potassium is being repleted    7)Acid base/alkalosis Patient has metabolic alkalosis secondary to morbid obesity and IV diuresis    Plan   We will continue the current IV Lasix     LOS: 30 Orianna Biskup s [facility] 11/5/20229:32 AM

## 2021-08-25 NOTE — Progress Notes (Signed)
Progress Note  Patient Name: Karen Farley Date of Encounter: 08/25/2021  [facility] Cardiologist: Kate Sable, MD   Subjective   UOP -3.9L . Kidney function stable. No chest pain. Breathing is stable.   Inpatient Medications    Scheduled Meds:  apixaban  5 mg Oral BID   Chlorhexidine Gluconate Cloth  6 each Topical Daily   docusate sodium  100 mg Oral Daily   folic acid  1 mg Oral Daily   hydrocortisone   Rectal TID   insulin aspart  0-5 Units Subcutaneous QHS   insulin aspart  0-9 Units Subcutaneous TID WC   iron polysaccharides  150 mg Oral Daily   latanoprost  1 drop Both Eyes QHS   metoprolol succinate  12.5 mg Oral Daily   midodrine  10 mg Oral TID WC   pantoprazole (PROTONIX) IV  40 mg Intravenous Q12H   polyethylene glycol  17 g Oral Daily   potassium chloride  40 mEq Oral TID   Ensure Max Protein  11 oz Oral BID   spironolactone  25 mg Oral Daily   vitamin B-12  500 mcg Oral Daily   Continuous Infusions:  sodium chloride Stopped (08/18/21 0325)   furosemide (LASIX) 200 mg in dextrose 5% 100 mL (2mg /mL) infusion 8 mg/hr (08/25/21 0347)   PRN Meds: acetaminophen, bisacodyl, lidocaine HCl (PF), lip balm, traMADol   Vital Signs    Vitals:   08/24/21 1518 08/24/21 1958 08/25/21 0050 08/25/21 0526  BP: 110/77 101/68 95/70 109/66  Pulse: 94 84 85 94  Resp: 17 20 20    Temp: 98.3 F (36.8 C) 98.6 F (37 C) 97.8 F (36.6 C) 97.7 F (36.5 C)  TempSrc: Oral   Oral  SpO2: 100% 100% 97% (!) 9%  Weight:    89.6 kg  Height:        Intake/Output Summary (Last 24 hours) at 08/25/2021 0726 Last data filed at 08/25/2021 0537 Gross per 24 hour  Intake 720 ml  Output 3945 ml  Net -3225 ml   Last 3 Weights 08/25/2021 08/24/2021 08/23/2021  Weight (lbs) 197 lb 8.5 oz 188 lb 15 oz 189 lb 3.2 oz  Weight (kg) 89.6 kg 85.7 kg 85.821 kg      Telemetry    Afib HR 70-80s - Personally Reviewed  ECG    No new - Personally Reviewed  Physical Exam    GEN: No acute distress.   Neck: No JVD Cardiac: RRR, no murmurs, rubs, or gallops.  Respiratory: diminished R side. GI: Soft, nontender, non-distended  MS: Trace edema; No deformity. Neuro:  Nonfocal  Psych: Normal affect   Labs    High Sensitivity Troponin:   Recent Labs  Lab 07/26/21 1708  TROPONINIHS 17     Chemistry Recent Labs  Lab 08/23/21 0800 08/24/21 0748 08/25/21 0500  NA 137 138 136  K 3.4* 3.5 3.4*  CL 90* 92* 90*  CO2 37* 36* 35*  GLUCOSE 167* 153* 170*  BUN 49* 43* 46*  CREATININE 1.67* 1.70* 1.55*  CALCIUM 9.6 9.4 9.3  MG 2.0 2.1 2.2  PROT 6.5 6.2* 6.2*  ALBUMIN 3.7 3.5 3.6  AST 23 30 26   ALT 15 15 14   ALKPHOS 84 80 84  BILITOT 1.9* 1.7* 1.6*  GFRNONAA 32* 31* 35*  ANIONGAP 10 10 11     Lipids No results for input(s): CHOL, TRIG, HDL, LABVLDL, LDLCALC, CHOLHDL in the last 168 hours.  Hematology Recent Labs  Lab 08/21/21 0540 08/22/21 6720  08/23/21 0800  WBC 4.5 4.1 4.0  RBC 3.65* 3.71* 3.80*  HGB 9.9* 10.2* 10.4*  HCT 32.8* 32.8* 33.9*  MCV 89.9 88.4 89.2  MCH 27.1 27.5 27.4  MCHC 30.2 31.1 30.7  RDW 25.8* 25.1* 24.5*  PLT 380 365 348   Thyroid No results for input(s): TSH, FREET4 in the last 168 hours.  BNPNo results for input(s): BNP, PROBNP in the last 168 hours.  DDimer No results for input(s): DDIMER in the last 168 hours.   Radiology    ECHOCARDIOGRAM LIMITED  Result Date: 08/23/2021    ECHOCARDIOGRAM LIMITED REPORT   Patient Name:   Karen Farley Date of Exam: 08/23/2021 Medical Rec #:  [id number]          Height:       62.0 in Accession #:    [id number]         Weight:       189.2 lb Date of Birth:  Nov 25, 1945           BSA:          1.867 m Patient Age:    38 years           BP:           107/59 mmHg Patient Gender: F                  HR:           93 bpm. Exam Location:  [facility] Procedure: Limited Echo, Cardiac Doppler and Color Doppler Indications:     pericardial Effusion I31.3  History:         Patient has prior history  of Echocardiogram examinations, most                  recent 08/19/2021. Risk Factors:Diabetes, Hypertension and                  Dyslipidemia. CKD stage 3.  Sonographer:     Sherrie Sport Referring Phys:  [id number] SONA PATEL Diagnosing Phys: Ida Rogue MD  Sonographer Comments: Suboptimal apical window. IMPRESSIONS  1. Left pleural effusion noted, 5 cm  2. Moderate pericardial effusion, estimated 1.9 off the LV free wall, minimal off the RV free wall (0.4 cm) . There is no evidence of cardiac tamponade.  3. Left ventricular ejection fraction, by estimation, is 60 to 65%. The left ventricle has normal function. The left ventricle has no regional wall motion abnormalities. There is mild left ventricular hypertrophy.  4. Right ventricular systolic function is normal. The right ventricular size is normal.  5. Left atrial size was severely dilated.  6. The mitral valve is normal in structure. Moderate mitral valve regurgitation. No evidence of mitral stenosis.  7. The aortic valve is normal in structure. Aortic valve regurgitation is not visualized. Mild to moderate aortic valve sclerosis/calcification is present, without any evidence of aortic stenosis. FINDINGS  Left Ventricle: Left ventricular ejection fraction, by estimation, is 60 to 65%. The left ventricle has normal function. The left ventricle has no regional wall motion abnormalities. The left ventricular internal cavity size was normal in size. There is  mild left ventricular hypertrophy. Right Ventricle: The right ventricular size is normal. No increase in right ventricular wall thickness. Right ventricular systolic function is normal. Left Atrium: Left atrial size was severely dilated. Right Atrium: Right atrial size was normal in size. Pericardium: A moderately sized pericardial effusion is present. There is no evidence of cardiac tamponade. Mitral Valve:  The mitral valve is normal in structure. Moderate mitral valve regurgitation. No evidence of mitral valve  stenosis. Tricuspid Valve: The tricuspid valve is normal in structure. Tricuspid valve regurgitation is not demonstrated. No evidence of tricuspid stenosis. Aortic Valve: The aortic valve is normal in structure. Aortic valve regurgitation is not visualized. Mild to moderate aortic valve sclerosis/calcification is present, without any evidence of aortic stenosis. Pulmonic Valve: The pulmonic valve was normal in structure. Pulmonic valve regurgitation is not visualized. No evidence of pulmonic stenosis. Aorta: The aortic root is normal in size and structure. Venous: The inferior vena cava was not well visualized. The inferior vena cava is normal in size with greater than 50% respiratory variability, suggesting right atrial pressure of 3 mmHg. IAS/Shunts: No atrial level shunt detected by color flow Doppler. LEFT VENTRICLE PLAX 2D LVIDd:         3.70 cm LVIDs:         1.90 cm LV PW:         1.30 cm LV IVS:        1.05 cm  RIGHT VENTRICLE RV Basal diam:  2.70 cm LEFT ATRIUM            Index LA diam:      4.60 cm  2.46 cm/m LA Vol (A4C): 120.0 ml 64.27 ml/m   AORTA Ao Root diam: 2.60 cm MITRAL VALVE                TRICUSPID VALVE MV Area (PHT): 6.71 cm     TR Peak grad:   23.4 mmHg MV Decel Time: 113 msec     TR Vmax:        242.00 cm/s MV E velocity: 132.00 cm/s Ida Rogue MD Electronically signed by Ida Rogue MD Signature Date/Time: 08/23/2021/6:02:48 PM    Final     Cardiac Studies   Echo limited 08/23/21  1. Left pleural effusion noted, 5 cm   2. Moderate pericardial effusion, estimated 1.9 off the LV free wall,  minimal off the RV free wall (0.4 cm) . There is no evidence of cardiac  tamponade.   3. Left ventricular ejection fraction, by estimation, is 60 to 65%. The  left ventricle has normal function. The left ventricle has no regional  wall motion abnormalities. There is mild left ventricular hypertrophy.   4. Right ventricular systolic function is normal. The right ventricular  size is  normal.   5. Left atrial size was severely dilated.   6. The mitral valve is normal in structure. Moderate mitral valve  regurgitation. No evidence of mitral stenosis.   7. The aortic valve is normal in structure. Aortic valve regurgitation is  not visualized. Mild to moderate aortic valve sclerosis/calcification is  present, without any evidence of aortic stenosis.      Patient Profile     75 y.o. female with history of HFpEF, hypertension, CKD 3, permanent atrial fibrillation presenting with hypotension, GI bleed and large pericardial effusion s/p pericardial drainage placement.  Assessment & Plan    Pericardial effusion, hemorrhagic - s/p pericardiocentesis - limited echo 11/3/ showed moderate effusion with no evidence of tamponade  HFpEF Pleural effusion s/p thoracentesis - presented with Anasarca - PTA torsemide 20mg  daily - IV lasix drip 8mg /hr - spironolactone 25mg  daily - net -24L - creatinine stable, continue with diuresus  Permanent Afib - rate controlled on metoprolol - on midodrine 10mg  TID - Eliquis for stroke ppx  Hypotension/shock - due to GIB - on midodrine three  times a day>>wean as able  For questions or updates, please contact [place] Please consult [email or web address] for contact info under        Signed, Kyilee Gregg Ninfa Meeker, PA-C  08/25/2021, 7:26 AM

## 2021-08-26 DIAGNOSIS — I4821 Permanent atrial fibrillation: Secondary | ICD-10-CM | POA: Diagnosis not present

## 2021-08-26 DIAGNOSIS — I5031 Acute diastolic (congestive) heart failure: Secondary | ICD-10-CM | POA: Diagnosis not present

## 2021-08-26 LAB — CBC
HCT: 35.2 % — ABNORMAL LOW (ref 36.0–46.0)
Hemoglobin: 10.8 g/dL — ABNORMAL LOW (ref 12.0–15.0)
MCH: 26.4 pg (ref 26.0–34.0)
MCHC: 30.7 g/dL (ref 30.0–36.0)
MCV: 86.1 fL (ref 80.0–100.0)
Platelets: 370 10*3/uL (ref 150–400)
RBC: 4.09 MIL/uL (ref 3.87–5.11)
RDW: 23.4 % — ABNORMAL HIGH (ref 11.5–15.5)
WBC: 4.5 10*3/uL (ref 4.0–10.5)
nRBC: 0 % (ref 0.0–0.2)

## 2021-08-26 LAB — BASIC METABOLIC PANEL
Anion gap: 10 (ref 5–15)
BUN: 51 mg/dL — ABNORMAL HIGH (ref 8–23)
CO2: 35 mmol/L — ABNORMAL HIGH (ref 22–32)
Calcium: 9.5 mg/dL (ref 8.9–10.3)
Chloride: 91 mmol/L — ABNORMAL LOW (ref 98–111)
Creatinine, Ser: 1.87 mg/dL — ABNORMAL HIGH (ref 0.44–1.00)
GFR, Estimated: 28 mL/min — ABNORMAL LOW (ref >60)
Glucose, Bld: 207 mg/dL — ABNORMAL HIGH (ref 70–99)
Potassium: 3.4 mmol/L — ABNORMAL LOW (ref 3.5–5.1)
Sodium: 136 mmol/L (ref 135–145)

## 2021-08-26 LAB — GLUCOSE, CAPILLARY
Glucose-Capillary: 217 mg/dL — ABNORMAL HIGH (ref 70–99)
Glucose-Capillary: 231 mg/dL — ABNORMAL HIGH (ref 70–99)
Glucose-Capillary: 268 mg/dL — ABNORMAL HIGH (ref 70–99)
Glucose-Capillary: 206 mg/dL — ABNORMAL HIGH (ref 70–99)

## 2021-08-26 MED ORDER — PANTOPRAZOLE SODIUM 40 MG PO TBEC
40.0000 mg | DELAYED_RELEASE_TABLET | Freq: Two times a day (BID) | ORAL | Status: DC
  Administered 2021-08-26 – 2021-08-28 (×4): 40 mg via ORAL
  Filled 2021-08-26 (×4): qty 1

## 2021-08-26 NOTE — Progress Notes (Signed)
[facility] [place] [facility] [place] NAME: Karen Farley    MR#:  [id number]  DATE OF BIRTH:  [birth year]  SUBJECTIVE:   Sitting in the chair.Overall doing well. Able to get up from the bed by herself.  REVIEW OF SYSTEMS:   Review of Systems  Constitutional:  Negative for chills, fever and weight loss.  HENT:  Negative for ear discharge, ear pain and nosebleeds.   Eyes:  Negative for blurred vision, pain and discharge.  Respiratory:  Negative for sputum production, shortness of breath, wheezing and stridor.   Cardiovascular:  Positive for leg swelling. Negative for chest pain, palpitations, orthopnea and PND.  Gastrointestinal:  Negative for abdominal pain, diarrhea, nausea and vomiting.  Genitourinary:  Negative for frequency and urgency.  Musculoskeletal:  Negative for back pain and joint pain.  Neurological:  Positive for weakness. Negative for sensory change, speech change and focal weakness.  Psychiatric/Behavioral:  Negative for depression and hallucinations. The patient is not nervous/anxious.   Tolerating Diet:yes Tolerating PT: HHPT  DRUG ALLERGIES:  No Known Allergies  VITALS:  Blood pressure 110/83, pulse 92, temperature 97.9 F (36.6 C), resp. rate 18, height 5\' 2"  (1.575 m), weight 88.8 kg, SpO2 98 %.  PHYSICAL EXAMINATION:    GENERAL:  75 y.o.-year-old patient lying in the bed with no acute distress. obese HEENT: Head atraumatic, normocephalic. Oropharynx and nasopharynx clear.  NECK:  Supple, no jugular venous distention. No thyroid enlargement, no tenderness.  LUNGS: decreased breath sounds bilaterally, no wheezing, rales, rhonchi. No use of accessory muscles of respiration.  CARDIOVASCULAR: S1, S2 normal. No murmurs, rubs, or gallops.  ABDOMEN: Soft, nontender, nondistended. Bowel sounds present. No organomegaly or mass.  EXTREMITIES: On 08/26/2021 NEUROLOGIC: non-focal PSYCHIATRIC:  patient is alert and oriented x 3.  SKIN: No  obvious rash, lesion, or ulcer.   LABORATORY PANEL:  CBC Recent Labs  Lab 08/26/21 0500  WBC 4.5  HGB 10.8*  HCT 35.2*  PLT 370     Chemistries  Recent Labs  Lab 08/25/21 0500 08/26/21 0500  NA 136 136  K 3.4* 3.4*  CL 90* 91*  CO2 35* 35*  GLUCOSE 170* 207*  BUN 46* 51*  CREATININE 1.55* 1.87*  CALCIUM 9.3 9.5  MG 2.2  --   AST 26  --   ALT 14  --   ALKPHOS 84  --   BILITOT 1.6*  --     Cardiac Enzymes No results for input(s): TROPONINI in the last 168 hours. RADIOLOGY:  No results found. ASSESSMENT AND PLAN:  75 y.o. WF PMHx HFpEF, permanent atrial fibrillation on Eliquis, PAD, type 2 diabetes, CKD stage IIIb, lymphedema   Presented to hospital with increasing shortness of breath and edema.  Patient has been noticing increasing abdominal distention since her admission in August with severe lower extremity edema and dyspnea and exertion.  Severe Anasarca acute on chronic diastolic heart failure pulmonary hypertension  -status post thoracocentesis with removal of 600 mL of fluid on 07/28/2021. --10/20-- torsemide 20 mg qd with IV albumin --10/21-- IV lqasix 40 mg bid per cardiology --10/22 cont IV lasix--good uop --10/23--IV lasix --10/24-- Dr Juleen China wants to start IV lasxi gtt --10/25-cont Lasix gtt -11/1 decrease Metoprolol 12.5 mg BID per cardiology -11/1 Zaroxolyn 2.5 mg x 1 --11/2--IV lasix gtt + zaroxylyn total17 liters UOP, weight trending down --11/3--cont IV lasix gtt. Get Echo to see status of pericardial effusion --11/4-- continue Lasix drip per Dr. Rockey Situ. He wants to  keep patient over the weekend. -- Repeat Echo 11/3-- moderate pericardial effusion. No evidence of tamponade --11/5 Lasix gtt + zaroxylyn 2.5 x1 --11/6-- lasix gtt   Large pericardial effusion  -S/p percardiocentesis x1 10/16   Large Pleural effusion s/p thoracentesis Patient underwent a pericardiocentesis on 08/05/2021 with removal of 700 mL of pleural fluid.  Inflammatory  markers were unremarkable.  Cardiology monitoring the patient while in the hospital.  Still on vasopressors due to hypotension. --repeat echo 10/21--no pericaridal effusion   Acute kidney injury on CKD stage 3b(Baseline Cr 1.4-1.7) - Currently at baseline.   --Nephrology followed the patient during hospitalization.  -- Benazepril and spironolactone on hold.   Permanent atrial fibrillation --Was previously on Eliquis.  Currently on hold due to GI bleed.  -- Rate controlled at this time--not on any po rate controlling meds --10/21--started on IV heparin gtt--monitor hgb  --10/22-- hgb remains stable --10/23-- discussed with G.I. Dr. Virgina Jock and cardiology Dr. Rockey Situ plan is to move forward with starting eliquis while patient is in-house to make sure she tolerates. DC heparin drip. Patient is in agreement with plan. --hgb stable  GI bleed with hemorrhagic shock. --Patient was noted to have significant drop in hemoglobin to 9.7 from 12.4 and was transferred to the ICU.   --GI Dr Alice Reichert from 10/19-- May forego endoluminal evaluation for the near future given lack of bleeding recurrence and persistence of cardiopulmonary issues. Patient is in agreement. --10/23-- d/w Dr Russo--no Rectal bleed. Ok to resume po eliquis    DM type II with hyperglycemia Diet controlled at home.   -- Sensitive SSI   Iron deficiency/Vitamin B12  --patient received Venofer -cont oral iron and vitamin B12 supplements.            Procedures: 10/8 s/p left thoracentesis: 659ml aspirated 10/12 paracentesis 1.2L aspirated .  10/16 Echocardiogram performed: Pericardiocentesis 767ml aspirated and drain placed  Failed Lasix IV advanced to Lasix gtt; however, Lasix and Eliquis stopped 10/15 and patient transferred to ICU after had episode of GI bleed with drop in hgb,  s/p 2U pRBC and vasopressor support initiated.   10/21 Echocardiogram LVEF= 60 to 65%.  -Pulmonary HTN: Assumed right atrial pressure of 15 mmHg, the   estimated right ventricular systolic pressure is 40.9 mmHg.  Pericardium: A small pericardial effusion is present.  10/27 LEFT thoracentesis: Aspirated 700 mL of clear yellow fluid was removed.  DVT prophylaxis: Eliquis  Code Status: Full Family Communication: husband today Status is: Inpatient       Dispo: The patient is from: Home              Anticipated d/c is to: Home              Anticipated d/c date is: TBD              Patient currently is not medically stable to d/c.  Per Cardiology needs IV lasix gtt for now       TOTAL TIME TAKING CARE OF THIS PATIENT: 25 minutes.  >50% time spent on counselling and coordination of care  Note: This dictation was prepared with Dragon dictation along with smaller phrase technology. Any transcriptional errors that result from this process are unintentional.  Fritzi Mandes M.D    [facility]   CC: Primary care physician; Valerie Roys, DO Patient ID: [name], female   DOB: 1946-01-29, 75 y.o.   MRN: [id number]

## 2021-08-26 NOTE — Progress Notes (Signed)
PHARMACIST - PHYSICIAN COMMUNICATION  CONCERNING: IV to Oral Route Change Policy  RECOMMENDATION: This patient is receiving pantoprazole by the intravenous route.  Based on criteria approved by the Pharmacy and Therapeutics Committee, the intravenous medication(s) is/are being converted to the equivalent oral dose form(s).   DESCRIPTION: These criteria include: The patient is eating (either orally or via tube) and/or has been taking other orally administered medications for a least 24 hours The patient has no evidence of active gastrointestinal bleeding or impaired GI absorption (gastrectomy, short bowel, patient on TNA or NPO).  If you have questions about this conversion, please contact the [place], [facility] 08/26/2021 1:49 PM

## 2021-08-26 NOTE — Progress Notes (Signed)
Progress Note  Patient Name: Karen Farley Date of Encounter: 08/26/2021  [facility] Cardiologist: Kate Sable, MD   Subjective   Breathing is improving, leg edema also improving.  Ambulated yesterday with nursing staff.  No acute events overnight.  Inpatient Medications    Scheduled Meds:  apixaban  5 mg Oral BID   Chlorhexidine Gluconate Cloth  6 each Topical Daily   docusate sodium  100 mg Oral Daily   folic acid  1 mg Oral Daily   hydrocortisone   Rectal TID   insulin aspart  0-5 Units Subcutaneous QHS   insulin aspart  0-9 Units Subcutaneous TID WC   iron polysaccharides  150 mg Oral Daily   latanoprost  1 drop Both Eyes QHS   metolazone  2.5 mg Oral Daily   metoprolol succinate  12.5 mg Oral Daily   midodrine  10 mg Oral TID WC   pantoprazole (PROTONIX) IV  40 mg Intravenous Q12H   polyethylene glycol  17 g Oral Daily   potassium chloride  40 mEq Oral TID   Ensure Max Protein  11 oz Oral BID   spironolactone  25 mg Oral Daily   vitamin B-12  500 mcg Oral Daily   Continuous Infusions:  sodium chloride Stopped (08/18/21 0325)   furosemide (LASIX) 200 mg in dextrose 5% 100 mL (2mg /mL) infusion 8 mg/hr (08/26/21 0424)   PRN Meds: acetaminophen, bisacodyl, lidocaine HCl (PF), lip balm, traMADol   Vital Signs    Vitals:   08/26/21 0047 08/26/21 0521 08/26/21 0753 08/26/21 1151  BP: 107/73 102/67 104/70 110/83  Pulse: 88 87 90 92  Resp: 20 19 18 18   Temp: 97.6 F (36.4 C) 97.9 F (36.6 C) 97.8 F (36.6 C) 97.9 F (36.6 C)  TempSrc:      SpO2: 97% 95% 99% 98%  Weight:  88.8 kg    Height:        Intake/Output Summary (Last 24 hours) at 08/26/2021 1227 Last data filed at 08/26/2021 1151 Gross per 24 hour  Intake 840 ml  Output 3300 ml  Net -2460 ml   Last 3 Weights 08/26/2021 08/25/2021 08/24/2021  Weight (lbs) 195 lb 12.3 oz 197 lb 8.5 oz 188 lb 15 oz  Weight (kg) 88.8 kg 89.6 kg 85.7 kg      Telemetry    Atrial fibrillation heart rate  97- Personally Reviewed  ECG     - Personally Reviewed  Physical Exam   GEN: Soft-spoken, Neck: No JVD Cardiac: Irregular irregular Respiratory: Rhonchorous breath sounds at bases GI: Soft, nontender, non-distended  MS: 2+ edema Neuro:  Nonfocal  Psych: Normal affect   Labs    High Sensitivity Troponin:  No results for input(s): TROPONINIHS in the last 720 hours.   Chemistry Recent Labs  Lab 08/23/21 0800 08/24/21 0748 08/25/21 0500 08/26/21 0500  NA 137 138 136 136  K 3.4* 3.5 3.4* 3.4*  CL 90* 92* 90* 91*  CO2 37* 36* 35* 35*  GLUCOSE 167* 153* 170* 207*  BUN 49* 43* 46* 51*  CREATININE 1.67* 1.70* 1.55* 1.87*  CALCIUM 9.6 9.4 9.3 9.5  MG 2.0 2.1 2.2  --   PROT 6.5 6.2* 6.2*  --   ALBUMIN 3.7 3.5 3.6  --   AST 23 30 26   --   ALT 15 15 14   --   ALKPHOS 84 80 84  --   BILITOT 1.9* 1.7* 1.6*  --   GFRNONAA 32* 31* 35* 28*  ANIONGAP 10 10 11 10     Lipids No results for input(s): CHOL, TRIG, HDL, LABVLDL, LDLCALC, CHOLHDL in the last 168 hours.  Hematology Recent Labs  Lab 08/22/21 0610 08/23/21 0800 08/26/21 0500  WBC 4.1 4.0 4.5  RBC 3.71* 3.80* 4.09  HGB 10.2* 10.4* 10.8*  HCT 32.8* 33.9* 35.2*  MCV 88.4 89.2 86.1  MCH 27.5 27.4 26.4  MCHC 31.1 30.7 30.7  RDW 25.1* 24.5* 23.4*  PLT 365 348 370   Thyroid No results for input(s): TSH, FREET4 in the last 168 hours.  BNPNo results for input(s): BNP, PROBNP in the last 168 hours.  DDimer No results for input(s): DDIMER in the last 168 hours.   Radiology    No results found.  Cardiac Studies   Limited Ttecho 08/23/2021   1. Left pleural effusion noted, 5 cm   2. Moderate pericardial effusion, estimated 1.9 off the LV free wall,  minimal off the RV free wall (0.4 cm) . There is no evidence of cardiac  tamponade.   3. Left ventricular ejection fraction, by estimation, is 60 to 65%. The  left ventricle has normal function. The left ventricle has no regional  wall motion abnormalities. There is  mild left ventricular hypertrophy.   4. Right ventricular systolic function is normal. The right ventricular  size is normal.   5. Left atrial size was severely dilated  Patient Profile     75 y.o. female with history of HFpEF, hypertension, CKD 3, permanent atrial fibrillation presenting with hypotension, GI bleed and large pericardial effusion s/p pericardiocentesis.  Being seen for volume overload and HFpEF.  Assessment & Plan    Pericardial effusion -S/p pericardiocentesis  -Last echo with moderate pericardial effusion. -She will not want any surgical procedures/pericardial window if needed. -Continue IV diuresing with Lasix.   -Consider repeat limited echocardiogram prior to discharge.   2.  HFpEF, volume overload -Continue midodrine 10 mg 3 times daily. -Blood pressures stable.  Net -3.1 L over the past 24 hours. -Continue Lasix drip,metolazone 2.5 mg daily. -Creatinine stable  -On discharge, she will likely need metolazone and torsemide 40 mg daily (recommend monitoring patient in house for 1 to 2 days whenever oral medications are started to determine efficacy prior to discharge.  She is high risk for readmission).   3.  Permanent A. Fib -Heart rate controlled -Toprol-XL 12.5 mg daily, Eliquis 5 mg twice daily     Greater than 50% was spent in counseling and coordination of care with patient Total encounter time 35 minutes       Signed, Kate Sable, MD  08/26/2021, 12:27 PM

## 2021-08-26 NOTE — Progress Notes (Signed)
[facility]  ROUNDING NOTE   Subjective:   [name] was admitted to [facility] on 07/26/2021 for Morbid obesity ([place]) [E66.01] DOE (dyspnea on exertion) [R06.09] Recurrent left pleural effusion [J90] Hypotension [I95.9] Type 2 diabetes mellitus without complication, without long-term current use of insulin ([place]) [E11.9] Atrial fibrillation, unspecified type ([place]) [I48.91] Chronic congestive heart failure, unspecified heart failure type ([place]) [I50.9]   Patient was seen today on the second floor Patient was comfortably sitting on the chair in the room Patient was tolerating her breakfast well Patient informed me that she was feeling much better Patient offers no new specific physical complaints  Objective:  Vital signs in last 24 hours:  Temp:  [97.6 F (36.4 C)-98.8 F (37.1 C)] 97.8 F (36.6 C) (11/06 0753) Pulse Rate:  [75-90] 90 (11/06 0753) Resp:  [16-20] 18 (11/06 0753) BP: (101-109)/(57-77) 104/70 (11/06 0753) SpO2:  [95 %-100 %] 99 % (11/06 0753) Weight:  [88.8 kg] 88.8 kg (11/06 0521)  Weight change: -0.8 kg Filed Weights   08/24/21 0222 08/25/21 0526 08/26/21 0521  Weight: 85.7 kg 89.6 kg 88.8 kg    Intake/Output: I/O last 3 completed shifts: In: 600 [P.O.:600] Out: 1610 [Urine:5320]   Intake/Output this shift:  No intake/output data recorded.  Physical Exam: General: In no acute distress, sitting in chair  Head: oral mucosal membranes moist  Lungs:  Clear bilaterally, normal effort  Heart: S1S2, irregular  Abdomen:  Soft, Non tender  Extremities:  1+ peripheral edema. Anasarca + bandages bilaterally  Neurologic: Awake, alert, able to answer questions appropriately  Skin: No acute lesions or rashes        Basic Metabolic Panel: Recent Labs  Lab 08/21/21 0540 08/22/21 0610 08/23/21 0800 08/24/21 0748 08/25/21 0500 08/26/21 0500  NA 138 139 137 138 136 136  K 3.8 3.5 3.4* 3.5 3.4* 3.4*  CL 96* 93* 90* 92* 90* 91*  CO2  34* 36* 37* 36* 35* 35*  GLUCOSE 178* 172* 167* 153* 170* 207*  BUN 43* 45* 49* 43* 46* 51*  CREATININE 1.67* 1.70* 1.67* 1.70* 1.55* 1.87*  CALCIUM 9.4 9.6 9.6 9.4 9.3 9.5  MG 2.1 2.1 2.0 2.1 2.2  --   PHOS 2.7 3.1 3.8 3.8 3.4  --     Liver Function Tests: Recent Labs  Lab 08/21/21 0540 08/22/21 0610 08/23/21 0800 08/24/21 0748 08/25/21 0500  AST 20 22 23 30 26   ALT 14 15 15 15 14   ALKPHOS 77 78 84 80 84  BILITOT 2.0* 2.0* 1.9* 1.7* 1.6*  PROT 6.1* 6.1* 6.5 6.2* 6.2*  ALBUMIN 3.6 3.6 3.7 3.5 3.6   No results for input(s): LIPASE, AMYLASE in the last 168 hours. No results for input(s): AMMONIA in the last 168 hours.  CBC: Recent Labs  Lab 08/20/21 0650 08/21/21 0540 08/22/21 0610 08/23/21 0800 08/26/21 0500  WBC 5.7 4.5 4.1 4.0 4.5  NEUTROABS  --  3.2 2.7 2.7  --   HGB 9.6* 9.9* 10.2* 10.4* 10.8*  HCT 30.1* 32.8* 32.8* 33.9* 35.2*  MCV 89.1 89.9 88.4 89.2 86.1  PLT 356 380 365 348 370    Cardiac Enzymes: No results for input(s): CKTOTAL, CKMB, CKMBINDEX, TROPONINI in the last 168 hours.  BNP: Invalid input(s): POCBNP  CBG: Recent Labs  Lab 08/25/21 0810 08/25/21 1138 08/25/21 1628 08/25/21 2129 08/26/21 0752  GLUCAP 170* 279* 202* 272* 217*    Microbiology: Results for orders placed or performed during the hospital encounter of 07/26/21  Resp  Panel by RT-PCR (Flu A&B, Covid) Nasopharyngeal Swab     Status: None   Collection Time: 07/26/21  7:19 PM   Specimen: Nasopharyngeal Swab; Nasopharyngeal(NP) swabs in vial transport medium  Result Value Ref Range Status   SARS Coronavirus 2 by RT PCR NEGATIVE NEGATIVE Final    Comment: (NOTE) SARS-CoV-2 target nucleic acids are NOT DETECTED.  The SARS-CoV-2 RNA is generally detectable in upper respiratory specimens during the acute phase of infection. The lowest concentration of SARS-CoV-2 viral copies this assay can detect is 138 copies/mL. A negative result does not preclude SARS-Cov-2 infection and  should not be used as the sole basis for treatment or other patient management decisions. A negative result may occur with  improper specimen collection/handling, submission of specimen other than nasopharyngeal swab, presence of viral mutation(s) within the areas targeted by this assay, and inadequate number of viral copies(<138 copies/mL). A negative result must be combined with clinical observations, patient history, and epidemiological information. The expected result is Negative.  Fact Sheet for Patients:  EntrepreneurPulse.com.au  Fact Sheet for Healthcare Providers:  IncredibleEmployment.be  This test is no t yet approved or cleared by the [place] FDA and  has been authorized for detection and/or diagnosis of SARS-CoV-2 by FDA under an Emergency Use Authorization (EUA). This EUA will remain  in effect (meaning this test can be used) for the duration of the COVID-19 declaration under Section 564(b)(1) of the Act, 21 U.S.C.section 360bbb-3(b)(1), unless the authorization is terminated  or revoked sooner.       Influenza A by PCR NEGATIVE NEGATIVE Final   Influenza B by PCR NEGATIVE NEGATIVE Final    Comment: (NOTE) The Xpert Xpress SARS-CoV-2/FLU/RSV plus assay is intended as an aid in the diagnosis of influenza from Nasopharyngeal swab specimens and should not be used as a sole basis for treatment. Nasal washings and aspirates are unacceptable for Xpert Xpress SARS-CoV-2/FLU/RSV testing.  Fact Sheet for Patients: EntrepreneurPulse.com.au  Fact Sheet for Healthcare Providers: IncredibleEmployment.be  This test is not yet approved or cleared by the [place] FDA and has been authorized for detection and/or diagnosis of SARS-CoV-2 by FDA under an Emergency Use Authorization (EUA). This EUA will remain in effect (meaning this test can be used) for the duration of the COVID-19 declaration  under Section 564(b)(1) of the Act, 21 U.S.C. section 360bbb-3(b)(1), unless the authorization is terminated or revoked.  Performed at [facility], [place] [place], [place] [address]   Body fluid culture w Gram Stain     Status: None   Collection Time: 07/28/21  1:53 PM   Specimen: PATH Cytology Pleural fluid  Result Value Ref Range Status   Specimen Description   Final    PLEURAL Performed at [facility], [address], [place], [place] [address]    Special Requests   Final    NONE Performed at [facility], [address] [place], [place] [address]    Gram Stain   Final    NO SQUAMOUS EPITHELIAL CELLS SEEN FEW WBC SEEN NO ORGANISMS SEEN    Culture   Final    NO GROWTH 3 DAYS Performed at [facility], [place] [address], [place], [place] [address]    Report Status 08/01/2021 FINAL  Final  Body fluid culture w Gram Stain     Status: None   Collection Time: 08/01/21  3:36 PM   Specimen: PATH Cytology Peritoneal fluid  Result Value Ref Range Status   Specimen  Description   Final    PERITONEAL Performed at [facility], [address] [place], [place] [address]    Special Requests   Final    NONE Performed at [facility], [place], [name] [address]    Gram Stain   Final    RARE WBC PRESENT,BOTH PMN AND MONONUCLEAR NO ORGANISMS SEEN    Culture   Final    NO GROWTH 3 DAYS Performed at [facility], [place] [address] [place], [place] [address]    Report Status 08/05/2021 FINAL  Final  MRSA Next Gen by PCR, Nasal     Status: None   Collection Time: 08/04/21  8:56 PM   Specimen: Nasal Mucosa; Nasal Swab  Result Value Ref Range Status   MRSA by PCR Next Gen NOT DETECTED NOT DETECTED Final    Comment: (NOTE) The GeneXpert MRSA Assay (FDA approved for NASAL specimens only), is one component of a comprehensive MRSA colonization surveillance program. It is not intended to diagnose MRSA  infection nor to guide or monitor treatment for MRSA infections. Test performance is not FDA approved in patients less than 72 years old. Performed at [facility], [address] [place], [place] [address]   Body fluid culture w Gram Stain     Status: None   Collection Time: 08/05/21  5:50 PM   Specimen: Pericardial  Result Value Ref Range Status   Specimen Description   Final    PERICARDIAL Performed at [facility], [address], [place], [place] [address]    Special Requests   Final    NONE Performed at [facility], [address] [place], [place] [address]    Gram Stain   Final    FEW WBC PRESENT, PREDOMINANTLY MONONUCLEAR NO ORGANISMS SEEN    Culture   Final    NO GROWTH 3 DAYS Performed at [facility], [place] [address], [place], [place] [address]    Report Status 08/09/2021 FINAL  Final  Fungus Culture With Stain     Status: None (Preliminary result)   Collection Time: 08/16/21 11:46 AM   Specimen: PATH Cytology Pleural fluid  Result Value Ref Range Status   Fungus Stain Final report  Final    Comment: (NOTE) Performed At: [facility] [place], [place] [id number] Rush Farmer MD [id number]    Fungus (Mycology) Culture PENDING  Incomplete   Fungal Source PLEURAL  Final    Comment: Performed at [facility], [address] [place], [place] [address]  Acid Fast Smear (AFB)     Status: None   Collection Time: 08/16/21 11:46 AM   Specimen: PATH Cytology Pleural fluid  Result Value Ref Range Status   AFB Specimen Processing Concentration  Final   Acid Fast Smear Negative  Final    Comment: (NOTE) Performed At: [facility] [place], [place] [id number] Rush Farmer MD [id number]    Source (AFB) PLEURAL  Final    Comment: Performed at [facility], [address] [place], [place] [address]  Body fluid culture w Gram Stain     Status: None    Collection Time: 08/16/21 11:46 AM   Specimen: PATH Cytology Pleural fluid  Result Value Ref Range Status   Specimen Description   Final    PLEURAL Performed at [facility], [address], [place], [place] [address]    Special Requests   Final  NONE Performed at [facility], [address] [place], [place] [address]    Gram Stain   Final    WBC PRESENT, PREDOMINANTLY MONONUCLEAR NO ORGANISMS SEEN CYTOSPIN SMEAR    Culture   Final    NO GROWTH 3 DAYS Performed at [facility], [place] [address], [place], [place] [address]    Report Status 08/19/2021 FINAL  Final  Fungus Culture Result     Status: None   Collection Time: 08/16/21 11:46 AM  Result Value Ref Range Status   Result 1 Comment  Final    Comment: (NOTE) KOH/Calcofluor preparation:  no fungus observed. Performed At: [facility] [place], [place] [id number] Rush Farmer MD LG:[id number]     Coagulation Studies: No results for input(s): LABPROT, INR in the last 72 hours.   Urinalysis: No results for input(s): COLORURINE, LABSPEC, PHURINE, GLUCOSEU, HGBUR, BILIRUBINUR, KETONESUR, PROTEINUR, UROBILINOGEN, NITRITE, LEUKOCYTESUR in the last 72 hours.  Invalid input(s): APPERANCEUR    Imaging: No results found.   Medications:    sodium chloride Stopped (08/18/21 0325)   furosemide (LASIX) 200 mg in dextrose 5% 100 mL (2mg /mL) infusion 8 mg/hr (08/26/21 0424)    apixaban  5 mg Oral BID   Chlorhexidine Gluconate Cloth  6 each Topical Daily   docusate sodium  100 mg Oral Daily   folic acid  1 mg Oral Daily   hydrocortisone   Rectal TID   insulin aspart  0-5 Units Subcutaneous QHS   insulin aspart  0-9 Units Subcutaneous TID WC   iron polysaccharides  150 mg Oral Daily   latanoprost  1 drop Both Eyes QHS   metolazone  2.5 mg Oral Daily   metoprolol succinate  12.5 mg Oral Daily   midodrine  10 mg Oral TID WC   pantoprazole (PROTONIX) IV  40 mg  Intravenous Q12H   polyethylene glycol  17 g Oral Daily   potassium chloride  40 mEq Oral TID   Ensure Max Protein  11 oz Oral BID   spironolactone  25 mg Oral Daily   vitamin B-12  500 mcg Oral Daily     Assessment/ Plan:  [name] is a 75 y.o. white female with diastolic congestive heart failure, hypertension, hyperlipidemia, lymphedema, atrial fibrillation, peripheral vascular disease who is admitted to [facility] on 07/26/2021 for Morbid obesity ([place]) [E66.01] DOE (dyspnea on exertion) [R06.09] Recurrent left pleural effusion [J90] Hypotension [I95.9] Type 2 diabetes mellitus without complication, without long-term current use of insulin ([place]) [E11.9] Atrial fibrillation, unspecified type ([place]) [I48.91] Chronic congestive heart failure, unspecified heart failure type ([place]) [I50.9]       1)Renal    AKI Patient has AKI secondary to ATN Patient AKI is now better Patient had a peak creatinine of 2.0 Patient creatinine now stable at 1.5--1.8 Patient had AKI on CKD Patient had CKD going back to 2016 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 Patient was admitted in June at that time patient had a peak creatinine of nearly 5.0 Patient then admitted again in August at that time patient had a peak creatinine of 1.8 Patient admitted again in October/now November as patient has been inpatient for past 30 days  2)Hypotension (media  Blood pressure is stable  Patient was hypotensive earlier and patient was started on midodrine  3)Anemia of chronic disease  CBC Latest Ref Rng & Units 08/26/2021 08/23/2021 08/22/2021  WBC 4.0 - 10.5 K/uL 4.5 4.0 4.1  Hemoglobin 12.0 - 15.0 g/dL 10.8(L) 10.4(L) 10.2(L)  Hematocrit 36.0 - 46.0 % 35.2(L) 33.9(L) 32.8(L)  Platelets 150 - 400 K/uL 370 348 365       HGb at goal (9--11)   4) acute on chronic diastolic CHF Patient is currently negative  by 36 liters  since admission    Patient is currently on IV Lasix drip at 8 mg/h  Will transition to PO  today  Lasix 8mg  per hour = will change to Torsemide 100mg  po daily    5) hemorrhagic pericardial effusion Patient is status post pericardiocentesis    6) Electrolytes   BMP Latest Ref Rng & Units 08/26/2021 08/25/2021 08/24/2021  Glucose 70 - 99 mg/dL 207(H) 170(H) 153(H)  BUN 8 - 23 mg/dL 51(H) 46(H) 43(H)  Creatinine 0.44 - 1.00 mg/dL 1.87(H) 1.55(H) 1.70(H)  BUN/Creat Ratio 12 - 28 - - -  Sodium 135 - 145 mmol/L 136 136 138  Potassium 3.5 - 5.1 mmol/L 3.4(L) 3.4(L) 3.5  Chloride 98 - 111 mmol/L 91(L) 90(L) 92(L)  CO2 22 - 32 mmol/L 35(H) 35(H) 36(H)  Calcium 8.9 - 10.3 mg/dL 9.5 9.3 9.4     Sodium Normonatremic   Potassium Hypokalemia Patient is hypokalemic secondary to IV diuresis Patient potassium is being repleted    7)Acid base/alkalosis Patient has metabolic alkalosis secondary to morbid obesity and IV diuresis    Plan   Will transition to torsemide 100mg  po daily if okay with cardiology      LOS: [place] 11/6/20227:54 AM

## 2021-08-27 DIAGNOSIS — I4821 Permanent atrial fibrillation: Secondary | ICD-10-CM | POA: Diagnosis not present

## 2021-08-27 DIAGNOSIS — I5033 Acute on chronic diastolic (congestive) heart failure: Secondary | ICD-10-CM | POA: Diagnosis not present

## 2021-08-27 LAB — GLUCOSE, CAPILLARY
Glucose-Capillary: 206 mg/dL — ABNORMAL HIGH (ref 70–99)
Glucose-Capillary: 365 mg/dL — ABNORMAL HIGH (ref 70–99)
Glucose-Capillary: 244 mg/dL — ABNORMAL HIGH (ref 70–99)
Glucose-Capillary: 191 mg/dL — ABNORMAL HIGH (ref 70–99)

## 2021-08-27 MED ORDER — TORSEMIDE 20 MG PO TABS
40.0000 mg | ORAL_TABLET | Freq: Every day | ORAL | Status: DC
  Administered 2021-08-27 – 2021-08-28 (×2): 40 mg via ORAL
  Filled 2021-08-27 (×2): qty 2

## 2021-08-27 MED ORDER — METOLAZONE 2.5 MG PO TABS
2.5000 mg | ORAL_TABLET | Freq: Every day | ORAL | Status: DC
  Administered 2021-08-28: 2.5 mg via ORAL
  Filled 2021-08-27 (×2): qty 1

## 2021-08-27 MED ORDER — METOLAZONE 5 MG PO TABS: 5.0000 mg | ORAL_TABLET | ORAL | Status: DC

## 2021-08-27 NOTE — Progress Notes (Signed)
Inpatient Diabetes Program Recommendations  AACE/ADA: New Consensus Statement on Inpatient Glycemic Control (2015)  Target Ranges:  Prepandial:   less than 140 mg/dL      Peak postprandial:   less than 180 mg/dL (1-2 hours)      Critically ill patients:  140 - 180 mg/dL  Results for Karen Farley, Karen Farley (MRN [id number]) as of 08/27/2021 13:17  Ref. Range 08/26/2021 07:52 08/26/2021 11:48 08/26/2021 16:17 08/26/2021 21:54  Glucose-Capillary Latest Ref Range: 70 - 99 mg/dL 217 (H) 231 (H) 268 (H) 206 (H)  Results for Karen Farley, Karen Farley (MRN [id number]) as of 08/27/2021 13:17  Ref. Range 08/27/2021 07:31 08/27/2021 11:30  Glucose-Capillary Latest Ref Range: 70 - 99 mg/dL 206 (H) 365 (H)      Home DM Meds: Tradjenta 5 mg daily  Current Orders: Novolog Sensitive Correction Scale/ SSI (0-9 units) TID AC + HS    No steroids since 10/14  Unsure why CBGs are so elevated?  MD- Please consider:  1. Start Levemir 8 units Daily (0.1 units/kg)  2. Start Novolog Meal Coverage: Novolog 3 units TID with meals     --Will follow patient during hospitalization--  Wyn Quaker RN, MSN, CDE Diabetes Coordinator Inpatient Glycemic Control Team Team Pager: [phone] (8a-5p)

## 2021-08-27 NOTE — Progress Notes (Addendum)
[place] [facility] [place] NAME: Karen Farley    MR#:  [id number]  DATE OF BIRTH:  08/02/1946  SUBJECTIVE:   Sitting in the chair.Overall doing well. Able to get up from the bed by herself.  REVIEW OF SYSTEMS:   Review of Systems  Constitutional:  Negative for chills, fever and weight loss.  HENT:  Negative for ear discharge, ear pain and nosebleeds.   Eyes:  Negative for blurred vision, pain and discharge.  Respiratory:  Negative for sputum production, shortness of breath, wheezing and stridor.   Cardiovascular:  Positive for leg swelling. Negative for chest pain, palpitations, orthopnea and PND.  Gastrointestinal:  Negative for abdominal pain, diarrhea, nausea and vomiting.  Genitourinary:  Negative for frequency and urgency.  Musculoskeletal:  Negative for back pain and joint pain.  Neurological:  Positive for weakness. Negative for sensory change, speech change and focal weakness.  Psychiatric/Behavioral:  Negative for depression and hallucinations. The patient is not nervous/anxious.   Tolerating Diet:yes Tolerating PT: HHPT  DRUG ALLERGIES:  No Known Allergies  VITALS:  Blood pressure 120/63, pulse 89, temperature 98.3 F (36.8 C), temperature source Oral, resp. rate 18, height 5\' 2"  (1.575 m), weight 87.6 kg, SpO2 97 %.  PHYSICAL EXAMINATION:    GENERAL:  75 y.o.-year-old patient lying in the bed with no acute distress. obese HEENT: Head atraumatic, normocephalic. Oropharynx and nasopharynx clear.  LUNGS: decreased breath sounds bilaterally, no wheezing, rales, rhonchi. No use of accessory muscles of respiration.  CARDIOVASCULAR: S1, S2 normal. No murmurs, rubs, or gallops.  ABDOMEN: Soft, nontender, nondistended. Bowel sounds present. No organomegaly or mass.  EXTREMITIES: On 08/26/2021 NEUROLOGIC: non-focal PSYCHIATRIC:  patient is alert and oriented x 3.  SKIN: No obvious rash, lesion, or ulcer.   LABORATORY PANEL:   CBC Recent Labs  Lab 08/26/21 0500  WBC 4.5  HGB 10.8*  HCT 35.2*  PLT 370     Chemistries  Recent Labs  Lab 08/25/21 0500 08/26/21 0500  NA 136 136  K 3.4* 3.4*  CL 90* 91*  CO2 35* 35*  GLUCOSE 170* 207*  BUN 46* 51*  CREATININE 1.55* 1.87*  CALCIUM 9.3 9.5  MG 2.2  --   AST 26  --   ALT 14  --   ALKPHOS 84  --   BILITOT 1.6*  --     Cardiac Enzymes No results for input(s): TROPONINI in the last 168 hours. RADIOLOGY:  No results found. ASSESSMENT AND PLAN:  75 y.o. WF PMHx HFpEF, permanent atrial fibrillation on Eliquis, PAD, type 2 diabetes, CKD stage IIIb, lymphedema   Presented to hospital with increasing shortness of breath and edema.  Patient has been noticing increasing abdominal distention since her admission in August with severe lower extremity edema and dyspnea and exertion.  Severe Anasarca acute on chronic diastolic heart failure pulmonary hypertension  -status post thoracocentesis with removal of 600 mL of fluid on 07/28/2021. --10/20-- torsemide 20 mg qd with IV albumin --10/21-- IV lqasix 40 mg bid per cardiology --10/22 cont IV lasix--good uop --10/23--IV lasix --10/24-- Dr Juleen China wants to start IV lasxi gtt --10/25-cont Lasix gtt -11/1 decrease Metoprolol 12.5 mg BID per cardiology -11/1 Zaroxolyn 2.5 mg x 1 --11/2--IV lasix gtt + zaroxylyn total17 liters UOP, weight trending down --11/3--cont IV lasix gtt. Get Echo to see status of pericardial effusion --11/4-- continue Lasix drip per Dr. Rockey Situ. He wants to keep patient over the weekend. -- Repeat Echo 11/3-- moderate  pericardial effusion. No evidence of tamponade --11/5 Lasix gtt + zaroxylyn 2.5 x1 --11/6-- lasix gtt --11/7-- Dr Fletcher Anon ok to d/c lasix gtt and start po torsemide 40 mg qd, spironolactone 25 mg qd  and Metolazone 2.5 mg qd   Large pericardial effusion  -S/p percardiocentesis x1 10/16   Large Pleural effusion s/p thoracentesis Patient underwent a pericardiocentesis on  08/05/2021 with removal of 700 mL of pleural fluid.  Inflammatory markers were unremarkable.  Cardiology monitoring the patient while in the hospital.  Still on vasopressors due to hypotension. --repeat echo 10/21--no pericaridal effusion   Acute kidney injury on CKD stage 3b(Baseline Cr 1.4-1.7) - Currently at baseline.   --Nephrology followed the patient during hospitalization.  -- Benazepril and spironolactone on hold.   Permanent atrial fibrillation --Was previously on Eliquis.  Currently on hold due to GI bleed.  -- Rate controlled at this time--not on any po rate controlling meds --10/21--started on IV heparin gtt--monitor hgb  --10/22-- hgb remains stable --10/23-- discussed with G.I. Dr. Virgina Jock and cardiology Dr. Rockey Situ plan is to move forward with starting eliquis while patient is in-house to make sure she tolerates. DC heparin drip. Patient is in agreement with plan. --hgb stable  GI bleed with hemorrhagic shock. --Patient was noted to have significant drop in hemoglobin to 9.7 from 12.4 and was transferred to the ICU.   --GI Dr Alice Reichert from 10/19-- May forego endoluminal evaluation for the near future given lack of bleeding recurrence and persistence of cardiopulmonary issues. Patient is in agreement. --10/23-- d/w Dr Russo--no Rectal bleed. Ok to resume po eliquis    DM type II with hyperglycemia Diet controlled at home.   -- Sensitive SSI   Iron deficiency/Vitamin B12  --patient received Venofer -cont oral iron and vitamin B12 supplements.            Procedures: 10/8 s/p left thoracentesis: 652ml aspirated 10/12 paracentesis 1.2L aspirated .  10/16 Echocardiogram performed: Pericardiocentesis 740ml aspirated and drain placed  Failed Lasix IV advanced to Lasix gtt; however, Lasix and Eliquis stopped 10/15 and patient transferred to ICU after had episode of GI bleed with drop in hgb,  s/p 2U pRBC and vasopressor support initiated.   10/21 Echocardiogram LVEF= 60 to  65%.  -Pulmonary HTN: Assumed right atrial pressure of 15 mmHg, the  estimated right ventricular systolic pressure is 11.9 mmHg.  Pericardium: A small pericardial effusion is present.  10/27 LEFT thoracentesis: Aspirated 700 mL of clear yellow fluid was removed.  DVT prophylaxis: Eliquis  Code Status: Full Family Communication: husband today Status is: Inpatient       Dispo: The patient is from: Home              Anticipated d/c is to: Home              Anticipated d/c date is: 11/8              Patient currently is medically stable to d/c.         TOTAL TIME TAKING CARE OF THIS PATIENT: 25 minutes.  >50% time spent on counselling and coordination of care  Note: This dictation was prepared with Dragon dictation along with smaller phrase technology. Any transcriptional errors that result from this process are unintentional.  Fritzi Mandes M.D    [facility]   CC: Primary care physician; Valerie Roys, DO Patient ID: Karen Farley, female   DOB: [birth year], 75 y.o.   MRN: [id number]

## 2021-08-27 NOTE — Plan of Care (Signed)
  Problem: Education: Goal: Knowledge of General Education information will improve Description Including pain rating scale, medication(s)/side effects and non-pharmacologic comfort measures Outcome: Progressing   Problem: Health Behavior/Discharge Planning: Goal: Ability to manage health-related needs will improve Outcome: Progressing   

## 2021-08-27 NOTE — Progress Notes (Signed)
Physical Therapy Treatment Patient Details Name: Karen Farley MRN: [id number] DOB: [birth year] Today's Date: 08/27/2021   History of Present Illness 75 y/o female history HFpEF, Afib on Eliquis, PAD, DM, CKDIII, lymphedema, HTN admitted to hospital 10/6 with sob, orthopnea, weight gain with hypotension, large left pleural effusion, generalized anasarca on admission. S/p left thoracentesis 10/8 629ml removed and paracentesis 10/12 1.2L removed. Failed Lasix IV advanced to Lasix gtt; however, Lasix and Eliquis stopped 10/15 and patient transferred to ICU after had episode of GI bleed with drop in hgb, s/p 2U pRBC and vasopressor support initiated. Echo done 10/16 showed large pericardial effusion s/p pericardiocentesis 793mL fluid removed with drain placed.    PT Comments    Pt received upright in recliner agreeable to participate. Reports amb on other end of nurses station with husband earlier today. Willing to attempt similar distance. Stands with supervision  to RW with good safety awareness and use of hands. Tolerated step through pattern with supervision 63' today reporting LE fatigue from previous ambulation earlier today thus requesting to return to room. Returned to recliner with max HR up to 122 BPM and resting post amb to 106-110 BPM. Pt educated on STS exercise for LE strengthening and endurance. Completed x5 with UE support on RW for light balance. D/c recs remain appropriate as pt is safely tolerating ability to complete household mobility with minor LE strength/endurance deficits.   Recommendations for follow up therapy are one component of a multi-disciplinary discharge planning process, led by the attending physician.  Recommendations may be updated based on patient status, additional functional criteria and insurance authorization.  Follow Up Recommendations  Home health PT     Assistance Recommended at Discharge Intermittent Supervision/Assistance  Equipment Recommendations   None recommended by PT    Recommendations for Other Services       Precautions / Restrictions Precautions Precautions: Fall Restrictions Weight Bearing Restrictions: No     Mobility  Bed Mobility               General bed mobility comments: not observed as patient sitting up on arrival to room and post session Patient Response: Cooperative  Transfers Overall transfer level: Needs assistance Equipment used: Rolling walker (2 wheels) Transfers: Sit to/from Stand Sit to Stand: Supervision           General transfer comment: Safe use of hands with transfers this session.    Ambulation/Gait Ambulation/Gait assistance: Supervision Gait Distance (Feet): 76 Feet Assistive device: Rolling walker (2 wheels) Gait Pattern/deviations: Step-through pattern;Wide base of support       General Gait Details: Max HR at 122 BPM with amb. Decreased to 106-110 BPM at rest post session.   Stairs             Wheelchair Mobility    Modified Rankin (Stroke Patients Only)       Balance Overall balance assessment: Needs assistance Sitting-balance support: Feet supported Sitting balance-Leahy Scale: Normal     Standing balance support: Bilateral upper extremity supported;Reliant on assistive device for balance Standing balance-Leahy Scale: Good                              Cognition Arousal/Alertness: Awake/alert Behavior During Therapy: WFL for tasks assessed/performed Overall Cognitive Status: Within Functional Limits for tasks assessed  Exercises Other Exercises Other Exercises: x5 STS from recliner for quad and glut strengthening    General Comments General comments (skin integrity, edema, etc.): Educated on STS exercise for quad and glut strengthening      Pertinent Vitals/Pain Pain Assessment: No/denies pain    Home Living                          Prior Function             PT Goals (current goals can now be found in the care plan section) Acute Rehab PT Goals Patient Stated Goal: to be able to walk to the bathroom PT Goal Formulation: With patient Time For Goal Achievement: 08/29/21 Potential to Achieve Goals: Good Progress towards PT goals: Progressing toward goals    Frequency    Min 2X/week      PT Plan Current plan remains appropriate    Co-evaluation              AM-PAC PT "6 Clicks" Mobility   Outcome Measure  Help needed turning from your back to your side while in a flat bed without using bedrails?: A Little Help needed moving from lying on your back to sitting on the side of a flat bed without using bedrails?: A Little Help needed moving to and from a bed to a chair (including a wheelchair)?: A Little Help needed standing up from a chair using your arms (e.g., wheelchair or bedside chair)?: A Little Help needed to walk in hospital room?: A Little Help needed climbing 3-5 steps with a railing? : A Lot 6 Click Score: 17    End of Session Equipment Utilized During Treatment: Gait belt Activity Tolerance: Patient tolerated treatment well Patient left: in chair;with call bell/phone within reach;with family/visitor present Nurse Communication: Mobility status PT Visit Diagnosis: Muscle weakness (generalized) (M62.81);Difficulty in walking, not elsewhere classified (R26.2);Unsteadiness on feet (R26.81)     Time: 1610-9604 PT Time Calculation (min) (ACUTE ONLY): 13 min  Charges:  $Therapeutic Exercise: 8-22 mins                    Salem Caster. Fairly IV, PT, DPT Physical Therapist- [facility]  08/27/2021, 2:42 PM

## 2021-08-27 NOTE — Progress Notes (Addendum)
[facility]  ROUNDING NOTE   Subjective:   [name] was admitted to [facility] on 07/26/2021 for Morbid obesity ([place]) [E66.01] DOE (dyspnea on exertion) [R06.09] Recurrent left pleural effusion [J90] Hypotension [I95.9] Type 2 diabetes mellitus without complication, without long-term current use of insulin ([place]) [E11.9] Atrial fibrillation, unspecified type ([place]) [I48.91] Chronic congestive heart failure, unspecified heart failure type ([place]) [I50.9]  Patient sitting at the side of bed Alert and oriented Creatinine steady Urine output 2L recorded for last 24 hours   Objective:  Vital signs in last 24 hours:  Temp:  [97.6 F (36.4 C)-98.5 F (36.9 C)] 98.3 F (36.8 C) (11/07 1129) Pulse Rate:  [79-103] 89 (11/07 1129) Resp:  [16-18] 18 (11/07 1129) BP: (102-120)/(53-71) 120/63 (11/07 1129) SpO2:  [94 %-100 %] 97 % (11/07 1129) Weight:  [87.6 kg] 87.6 kg (11/07 0503)  Weight change: -1.165 kg Filed Weights   08/25/21 0526 08/26/21 0521 08/27/21 0503  Weight: 89.6 kg 88.8 kg 87.6 kg    Intake/Output: I/O last 3 completed shifts: In: 6045 [P.O.:1317] Out: 3650 [Urine:3650]   Intake/Output this shift:  Total I/O In: 240 [P.O.:240] Out: 850 [Urine:850]  Physical Exam: General: In no acute distress  Head: oral mucosal membranes moist  Lungs:  Clear bilaterally, normal effort  Heart: S1S2, irregular  Abdomen:  Soft, Non tender  Extremities:  + peripheral edema. Anasarca +  Neurologic: Awake, alert, able to answer questions appropriately  Skin: No acute lesions or rashes        Basic Metabolic Panel: Recent Labs  Lab 08/21/21 0540 08/22/21 0610 08/23/21 0800 08/24/21 0748 08/25/21 0500 08/26/21 0500  NA 138 139 137 138 136 136  K 3.8 3.5 3.4* 3.5 3.4* 3.4*  CL 96* 93* 90* 92* 90* 91*  CO2 34* 36* 37* 36* 35* 35*  GLUCOSE 178* 172* 167* 153* 170* 207*  BUN 43* 45* 49* 43* 46* 51*  CREATININE 1.67* 1.70* 1.67* 1.70* 1.55* 1.87*   CALCIUM 9.4 9.6 9.6 9.4 9.3 9.5  MG 2.1 2.1 2.0 2.1 2.2  --   PHOS 2.7 3.1 3.8 3.8 3.4  --      Liver Function Tests: Recent Labs  Lab 08/21/21 0540 08/22/21 0610 08/23/21 0800 08/24/21 0748 08/25/21 0500  AST 20 22 23 30 26   ALT 14 15 15 15 14   ALKPHOS 77 78 84 80 84  BILITOT 2.0* 2.0* 1.9* 1.7* 1.6*  PROT 6.1* 6.1* 6.5 6.2* 6.2*  ALBUMIN 3.6 3.6 3.7 3.5 3.6    No results for input(s): LIPASE, AMYLASE in the last 168 hours. No results for input(s): AMMONIA in the last 168 hours.  CBC: Recent Labs  Lab 08/21/21 0540 08/22/21 0610 08/23/21 0800 08/26/21 0500  WBC 4.5 4.1 4.0 4.5  NEUTROABS 3.2 2.7 2.7  --   HGB 9.9* 10.2* 10.4* 10.8*  HCT 32.8* 32.8* 33.9* 35.2*  MCV 89.9 88.4 89.2 86.1  PLT 380 365 348 370     Cardiac Enzymes: No results for input(s): CKTOTAL, CKMB, CKMBINDEX, TROPONINI in the last 168 hours.  BNP: Invalid input(s): POCBNP  CBG: Recent Labs  Lab 08/26/21 1148 08/26/21 1617 08/26/21 2154 08/27/21 0731 08/27/21 1130  GLUCAP 231* 268* 206* 206* 365*     Microbiology: Results for orders placed or performed during the hospital encounter of 07/26/21  Resp Panel by RT-PCR (Flu A&B, Covid) Nasopharyngeal Swab     Status: None   Collection Time: 07/26/21  7:19 PM   Specimen: Nasopharyngeal Swab;  Nasopharyngeal(NP) swabs in vial transport medium  Result Value Ref Range Status   SARS Coronavirus 2 by RT PCR NEGATIVE NEGATIVE Final    Comment: (NOTE) SARS-CoV-2 target nucleic acids are NOT DETECTED.  The SARS-CoV-2 RNA is generally detectable in upper respiratory specimens during the acute phase of infection. The lowest concentration of SARS-CoV-2 viral copies this assay can detect is 138 copies/mL. A negative result does not preclude SARS-Cov-2 infection and should not be used as the sole basis for treatment or other patient management decisions. A negative result may occur with  improper specimen collection/handling, submission of  specimen other than nasopharyngeal swab, presence of viral mutation(s) within the areas targeted by this assay, and inadequate number of viral copies(<138 copies/mL). A negative result must be combined with clinical observations, patient history, and epidemiological information. The expected result is Negative.  Fact Sheet for Patients:  EntrepreneurPulse.com.au  Fact Sheet for Healthcare Providers:  IncredibleEmployment.be  This test is no t yet approved or cleared by the [place] FDA and  has been authorized for detection and/or diagnosis of SARS-CoV-2 by FDA under an Emergency Use Authorization (EUA). This EUA will remain  in effect (meaning this test can be used) for the duration of the COVID-19 declaration under Section 564(b)(1) of the Act, 21 U.S.C.section 360bbb-3(b)(1), unless the authorization is terminated  or revoked sooner.       Influenza A by PCR NEGATIVE NEGATIVE Final   Influenza B by PCR NEGATIVE NEGATIVE Final    Comment: (NOTE) The Xpert Xpress SARS-CoV-2/FLU/RSV plus assay is intended as an aid in the diagnosis of influenza from Nasopharyngeal swab specimens and should not be used as a sole basis for treatment. Nasal washings and aspirates are unacceptable for Xpert Xpress SARS-CoV-2/FLU/RSV testing.  Fact Sheet for Patients: EntrepreneurPulse.com.au  Fact Sheet for Healthcare Providers: IncredibleEmployment.be  This test is not yet approved or cleared by the [place] FDA and has been authorized for detection and/or diagnosis of SARS-CoV-2 by FDA under an Emergency Use Authorization (EUA). This EUA will remain in effect (meaning this test can be used) for the duration of the COVID-19 declaration under Section 564(b)(1) of the Act, 21 U.S.C. section 360bbb-3(b)(1), unless the authorization is terminated or revoked.  Performed at [facility], [address] [place], [place] [address]   Body fluid culture w Gram Stain     Status: None   Collection Time: 07/28/21  1:53 PM   Specimen: PATH Cytology Pleural fluid  Result Value Ref Range Status   Specimen Description   Final    PLEURAL Performed at [facility], [address], [place], [place] [address]    Special Requests   Final    NONE Performed at [facility], [address] [place], [place] [address]    Gram Stain   Final    NO SQUAMOUS EPITHELIAL CELLS SEEN FEW WBC SEEN NO ORGANISMS SEEN    Culture   Final    NO GROWTH 3 DAYS Performed at [facility], [place] [address], [place], [place] [address]    Report Status 08/01/2021 FINAL  Final  Body fluid culture w Gram Stain     Status: None   Collection Time: 08/01/21  3:36 PM   Specimen: PATH Cytology Peritoneal fluid  Result Value Ref Range Status   Specimen Description   Final    PERITONEAL Performed at [facility], [address], [place], [place] [address]    Special Requests  Final    NONE Performed at [facility], [place], [place] [address]    Gram Stain   Final    RARE WBC PRESENT,BOTH PMN AND MONONUCLEAR NO ORGANISMS SEEN    Culture   Final    NO GROWTH 3 DAYS Performed at [facility], [place] [address] [place], [place] [address]    Report Status 08/05/2021 FINAL  Final  MRSA Next Gen by PCR, Nasal     Status: None   Collection Time: 08/04/21  8:56 PM   Specimen: Nasal Mucosa; Nasal Swab  Result Value Ref Range Status   MRSA by PCR Next Gen NOT DETECTED NOT DETECTED Final    Comment: (NOTE) The GeneXpert MRSA Assay (FDA approved for NASAL specimens only), is one component of a comprehensive MRSA colonization surveillance program. It is not intended to diagnose MRSA infection nor to guide or monitor treatment for MRSA infections. Test performance is not FDA approved in patients less than 87 years old. Performed at [facility],  [address] [place], [place] [address]   Body fluid culture w Gram Stain     Status: None   Collection Time: 08/05/21  5:50 PM   Specimen: Pericardial  Result Value Ref Range Status   Specimen Description   Final    PERICARDIAL Performed at [facility], [address], [place], [place] [address]    Special Requests   Final    NONE Performed at [facility], [address] [place], [place] [address]    Gram Stain   Final    FEW WBC PRESENT, PREDOMINANTLY MONONUCLEAR NO ORGANISMS SEEN    Culture   Final    NO GROWTH 3 DAYS Performed at [facility], [place] [address] [place], [place] [address]    Report Status 08/09/2021 FINAL  Final  Fungus Culture With Stain     Status: None (Preliminary result)   Collection Time: 08/16/21 11:46 AM   Specimen: PATH Cytology Pleural fluid  Result Value Ref Range Status   Fungus Stain Final report  Final    Comment: (NOTE) Performed At: [facility], [place], [place] [id number] Rush Farmer MD [id number]    Fungus (Mycology) Culture PENDING  Incomplete   Fungal Source PLEURAL  Final    Comment: Performed at [facility], [address] [place], [place] [address]  Acid Fast Smear (AFB)     Status: None   Collection Time: 08/16/21 11:46 AM   Specimen: PATH Cytology Pleural fluid  Result Value Ref Range Status   AFB Specimen Processing Concentration  Final   Acid Fast Smear Negative  Final    Comment: (NOTE) Performed At: [facility] [place], [place] [id number] Rush Farmer MD [id number]    Source (AFB) PLEURAL  Final    Comment: Performed at [facility], [address] [place], [place] [address]  Body fluid culture w Gram Stain     Status: None   Collection Time: 08/16/21 11:46 AM   Specimen: PATH Cytology Pleural fluid  Result Value Ref Range Status   Specimen Description   Final    PLEURAL Performed at [facility], [address] [place], [place] [address]    Special Requests   Final    NONE Performed at [facility], [address] [place], [place] [address]    Gram Stain   Final    WBC PRESENT, PREDOMINANTLY  MONONUCLEAR NO ORGANISMS SEEN CYTOSPIN SMEAR    Culture   Final    NO GROWTH 3 DAYS Performed at [facility], [place] [address] [place], [place] [address]    Report Status 08/19/2021 FINAL  Final  Fungus Culture Result     Status: None   Collection Time: 08/16/21 11:46 AM  Result Value Ref Range Status   Result 1 Comment  Final    Comment: (NOTE) KOH/Calcofluor preparation:  no fungus observed. Performed At: [facility] [place], [place] [id number] Rush Farmer MD [id number]     Coagulation Studies: No results for input(s): LABPROT, INR in the last 72 hours.   Urinalysis: No results for input(s): COLORURINE, LABSPEC, PHURINE, GLUCOSEU, HGBUR, BILIRUBINUR, KETONESUR, PROTEINUR, UROBILINOGEN, NITRITE, LEUKOCYTESUR in the last 72 hours.  Invalid input(s): APPERANCEUR    Imaging: No results found.   Medications:    sodium chloride Stopped (08/18/21 0325)    apixaban  5 mg Oral BID   Chlorhexidine Gluconate Cloth  6 each Topical Daily   docusate sodium  100 mg Oral Daily   folic acid  1 mg Oral Daily   hydrocortisone   Rectal TID   insulin aspart  0-5 Units Subcutaneous QHS   insulin aspart  0-9 Units Subcutaneous TID WC   iron polysaccharides  150 mg Oral Daily   latanoprost  1 drop Both Eyes QHS   metolazone  2.5 mg Oral Daily   metoprolol succinate  12.5 mg Oral Daily   midodrine  10 mg Oral TID WC   pantoprazole  40 mg Oral BID   polyethylene glycol  17 g Oral Daily   potassium chloride  40 mEq Oral TID   Ensure Max Protein  11 oz Oral BID   spironolactone  25 mg Oral Daily   torsemide  40 mg Oral Daily   vitamin B-12  500 mcg Oral Daily     Assessment/ Plan:  [name] is a 75 y.o. white  female with diastolic congestive heart failure, hypertension, hyperlipidemia, lymphedema, atrial fibrillation, peripheral vascular disease who is admitted to [facility] on 07/26/2021 for Morbid obesity ([place]) [E66.01] DOE (dyspnea on exertion) [R06.09] Recurrent left pleural effusion [J90] Hypotension [I95.9] Type 2 diabetes mellitus without complication, without long-term current use of insulin ([place]) [E11.9] Atrial fibrillation, unspecified type ([place]) [I48.91] Chronic congestive heart failure, unspecified heart failure type ([place]) [I50.9]  #Acute kidney injury on chronic kidney disease stage IIIB: with baseline creatinine of 1.66, GFR of 36 on 07/13/21. History of bland urine. 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 Hold benazepril. Creatinine remained stable.  Adequate urine output recorded at 2 L in past 24 hours.  IV Lasix drip discontinued, replaced with torsemide 40 mg oral twice daily.  We will schedule follow-up appointment with our office in 1 to 2 weeks past discharge.  #Hypotension Blood Pressure remains soft 120/63 Continue midodrine 10 mg 3 times a day  #Acute exacerbation of diastolic congestive heart failure:  ECHO performed on 08/24/21 shows moderate pericardial effusion Cardiology team following and monitoring.  Lasix drip stopped tinea oral diuretics continued.  Currently receiving torsemide 40 mg, spironolactone 25 mg and metolazone 5 mg twice weekly.   LOS: [place] 11/7/20221:50 PM

## 2021-08-27 NOTE — Progress Notes (Signed)
Progress Note  Patient Name: Karen Farley Date of Encounter: 08/27/2021  [facility] Cardiologist: Kate Sable, MD   Subjective   She is feeling significantly better with improved shortness of breath and leg edema.  She is -32 L for the admission and her weight is down 15 kg.  Inpatient Medications    Scheduled Meds:  apixaban  5 mg Oral BID   Chlorhexidine Gluconate Cloth  6 each Topical Daily   docusate sodium  100 mg Oral Daily   folic acid  1 mg Oral Daily   hydrocortisone   Rectal TID   insulin aspart  0-5 Units Subcutaneous QHS   insulin aspart  0-9 Units Subcutaneous TID WC   iron polysaccharides  150 mg Oral Daily   latanoprost  1 drop Both Eyes QHS   metolazone  2.5 mg Oral Daily   metoprolol succinate  12.5 mg Oral Daily   midodrine  10 mg Oral TID WC   pantoprazole  40 mg Oral BID   polyethylene glycol  17 g Oral Daily   potassium chloride  40 mEq Oral TID   Ensure Max Protein  11 oz Oral BID   spironolactone  25 mg Oral Daily   vitamin B-12  500 mcg Oral Daily   Continuous Infusions:  sodium chloride Stopped (08/18/21 0325)   furosemide (LASIX) 200 mg in dextrose 5% 100 mL (2mg /mL) infusion 8 mg/hr (08/27/21 0733)   PRN Meds: acetaminophen, bisacodyl, lidocaine HCl (PF), lip balm, traMADol   Vital Signs    Vitals:   08/26/21 2352 08/27/21 0354 08/27/21 0503 08/27/21 0734  BP: 103/67 102/65  (!) 105/53  Pulse: (!) 103 90  86  Resp: 16 16  18   Temp: 97.6 F (36.4 C) 98.2 F (36.8 C)  98.5 F (36.9 C)  TempSrc:    Oral  SpO2: 94% 97%  97%  Weight:   87.6 kg   Height:        Intake/Output Summary (Last 24 hours) at 08/27/2021 0852 Last data filed at 08/27/2021 0735 Gross per 24 hour  Intake 1197 ml  Output 2450 ml  Net -1253 ml    Last 3 Weights 08/27/2021 08/26/2021 08/25/2021  Weight (lbs) 193 lb 3.2 oz 195 lb 12.3 oz 197 lb 8.5 oz  Weight (kg) 87.635 kg 88.8 kg 89.6 kg      Telemetry    Atrial fibrillation heart rate  between 90-110- Personally Reviewed  ECG     - Personally Reviewed  Physical Exam   GEN: Soft-spoken, Neck: No JVD Cardiac: Irregular irregular Respiratory: Lungs are clear to auscultation. GI: Soft, nontender, non-distended  MS: +1 edema significantly improved from before. Neuro:  Nonfocal  Psych: Normal affect   Labs    High Sensitivity Troponin:  No results for input(s): TROPONINIHS in the last 720 hours.   Chemistry Recent Labs  Lab 08/23/21 0800 08/24/21 0748 08/25/21 0500 08/26/21 0500  NA 137 138 136 136  K 3.4* 3.5 3.4* 3.4*  CL 90* 92* 90* 91*  CO2 37* 36* 35* 35*  GLUCOSE 167* 153* 170* 207*  BUN 49* 43* 46* 51*  CREATININE 1.67* 1.70* 1.55* 1.87*  CALCIUM 9.6 9.4 9.3 9.5  MG 2.0 2.1 2.2  --   PROT 6.5 6.2* 6.2*  --   ALBUMIN 3.7 3.5 3.6  --   AST 23 30 26   --   ALT 15 15 14   --   ALKPHOS 84 80 84  --   BILITOT  1.9* 1.7* 1.6*  --   GFRNONAA 32* 31* 35* 28*  ANIONGAP 10 10 11 10      Lipids No results for input(s): CHOL, TRIG, HDL, LABVLDL, LDLCALC, CHOLHDL in the last 168 hours.  Hematology Recent Labs  Lab 08/22/21 0610 08/23/21 0800 08/26/21 0500  WBC 4.1 4.0 4.5  RBC 3.71* 3.80* 4.09  HGB 10.2* 10.4* 10.8*  HCT 32.8* 33.9* 35.2*  MCV 88.4 89.2 86.1  MCH 27.5 27.4 26.4  MCHC 31.1 30.7 30.7  RDW 25.1* 24.5* 23.4*  PLT 365 348 370    Thyroid No results for input(s): TSH, FREET4 in the last 168 hours.  BNPNo results for input(s): BNP, PROBNP in the last 168 hours.  DDimer No results for input(s): DDIMER in the last 168 hours.   Radiology    No results found.  Cardiac Studies   Limited Ttecho 08/23/2021   1. Left pleural effusion noted, 5 cm   2. Moderate pericardial effusion, estimated 1.9 off the LV free wall,  minimal off the RV free wall (0.4 cm) . There is no evidence of cardiac  tamponade.   3. Left ventricular ejection fraction, by estimation, is 60 to 65%. The  left ventricle has normal function. The left ventricle has no  regional  wall motion abnormalities. There is mild left ventricular hypertrophy.   4. Right ventricular systolic function is normal. The right ventricular  size is normal.   5. Left atrial size was severely dilated  Patient Profile     75 y.o. female with history of HFpEF, hypertension, CKD 3, permanent atrial fibrillation presenting with hypotension, GI bleed and large pericardial effusion s/p pericardiocentesis.  Being seen for volume overload and HFpEF.  Assessment & Plan    Pericardial effusion -S/p pericardiocentesis  -Last echo with moderate pericardial effusion. -Recommend repeat echocardiogram in few weeks in the outpatient setting.   2.  Acute on chronic diastolic heart failure with anasarca -Significant improvement in volume overload on Lasix drip. Recommend switching Lasix drip to torsemide 40 mg once daily.  She was on 20 mg at home before admission.  In addition, recommend continuing small dose of metolazone 2.5 mg once daily and spironolactone 25 mg once daily.  3.  Permanent A. Fib -Continue small dose Toprol for rate control and anticoagulation with Eliquis 5 mg twice daily.   I discussed the plan with Dr. Posey Pronto.    Signed, Kathlyn Sacramento, MD  08/27/2021, 8:52 AM

## 2021-08-27 NOTE — Care Management Important Message (Signed)
Important Message  Patient Details  Name: Karen Farley MRN: [id number] Date of Birth: 1946/07/09   Medicare Important Message Given:  Yes     Dannette Barbara 08/27/2021, 4:16 PM

## 2021-08-27 NOTE — Consult Note (Signed)
[place] Nurse Consult Note: Patient receiving care in [facility] 257. Reason for Consult: Re-evaluation of BLE Wound type: Previous fluid filled bullae areas the the BLE pre-tibial areas have healed. Patient is using her compression sleeves a couple of times each day. Patient is very knowledgeable about wrappings and use of compression sleeves to prevent bullae formation. I have discontinued the xeroform and ace wrap order; it is no longer needed. [place] nurse will not follow at this time.  Please re-consult the [place] team if needed.  Val Riles, RN, MSN, CWOCN, CNS-BC, pager [phone]

## 2021-08-28 DIAGNOSIS — I5033 Acute on chronic diastolic (congestive) heart failure: Secondary | ICD-10-CM | POA: Diagnosis not present

## 2021-08-28 DIAGNOSIS — I3139 Other pericardial effusion (noninflammatory): Secondary | ICD-10-CM | POA: Diagnosis not present

## 2021-08-28 DIAGNOSIS — R601 Generalized edema: Secondary | ICD-10-CM

## 2021-08-28 LAB — BASIC METABOLIC PANEL
Anion gap: 13 (ref 5–15)
BUN: 53 mg/dL — ABNORMAL HIGH (ref 8–23)
CO2: 33 mmol/L — ABNORMAL HIGH (ref 22–32)
Calcium: 9.9 mg/dL (ref 8.9–10.3)
Chloride: 91 mmol/L — ABNORMAL LOW (ref 98–111)
Creatinine, Ser: 2.07 mg/dL — ABNORMAL HIGH (ref 0.44–1.00)
GFR, Estimated: 25 mL/min — ABNORMAL LOW (ref >60)
Glucose, Bld: 229 mg/dL — ABNORMAL HIGH (ref 70–99)
Potassium: 3.8 mmol/L (ref 3.5–5.1)
Sodium: 137 mmol/L (ref 135–145)

## 2021-08-28 LAB — GLUCOSE, CAPILLARY: Glucose-Capillary: 221 mg/dL — ABNORMAL HIGH (ref 70–99)

## 2021-08-28 MED ORDER — POLYETHYLENE GLYCOL 3350 17 G PO PACK: 17.0000 g | PACK | Freq: Every day | ORAL | 0 refills | Status: DC

## 2021-08-28 MED ORDER — METOPROLOL SUCCINATE ER 25 MG PO TB24
12.5000 mg | ORAL_TABLET | Freq: Every day | ORAL | 2 refills | Status: DC
Start: 2021-08-28 — End: 2021-09-04

## 2021-08-28 MED ORDER — CYANOCOBALAMIN 500 MCG PO TABS: 500.0000 ug | ORAL_TABLET | Freq: Every day | ORAL | 2 refills | Status: DC

## 2021-08-28 MED ORDER — POTASSIUM CHLORIDE CRYS ER 20 MEQ PO TBCR: 20.0000 meq | EXTENDED_RELEASE_TABLET | Freq: Every day | ORAL | 0 refills | Status: DC

## 2021-08-28 MED ORDER — PANTOPRAZOLE SODIUM 40 MG PO TBEC: 40.0000 mg | DELAYED_RELEASE_TABLET | Freq: Two times a day (BID) | ORAL | 2 refills | Status: DC

## 2021-08-28 MED ORDER — SPIRONOLACTONE 25 MG PO TABS: 25.0000 mg | ORAL_TABLET | Freq: Every day | ORAL | 1 refills | Status: DC

## 2021-08-28 MED ORDER — METOLAZONE 2.5 MG PO TABS: 2.5000 mg | ORAL_TABLET | Freq: Every day | ORAL | 2 refills | Status: DC

## 2021-08-28 MED ORDER — HYDROCORTISONE (PERIANAL) 2.5 % EX CREA: TOPICAL_CREAM | Freq: Three times a day (TID) | CUTANEOUS | 0 refills | Status: DC

## 2021-08-28 MED ORDER — TRAMADOL HCL 50 MG PO TABS: 50.0000 mg | ORAL_TABLET | Freq: Two times a day (BID) | ORAL | 0 refills | Status: DC | PRN

## 2021-08-28 MED ORDER — FOLIC ACID 1 MG PO TABS: 1.0000 mg | ORAL_TABLET | Freq: Every day | ORAL | 1 refills | Status: DC

## 2021-08-28 MED ORDER — TORSEMIDE 40 MG PO TABS: 40.0000 mg | ORAL_TABLET | Freq: Every day | ORAL | 2 refills | Status: DC

## 2021-08-28 MED ORDER — DOCUSATE SODIUM 100 MG PO CAPS: 100.0000 mg | ORAL_CAPSULE | Freq: Every day | ORAL | 0 refills | Status: DC

## 2021-08-28 MED ORDER — MIDODRINE HCL 10 MG PO TABS: 10.0000 mg | ORAL_TABLET | Freq: Three times a day (TID) | ORAL | 2 refills | Status: DC

## 2021-08-28 MED ORDER — POLYSACCHARIDE IRON COMPLEX 150 MG PO CAPS: 150.0000 mg | ORAL_CAPSULE | Freq: Every day | ORAL | 1 refills | Status: DC

## 2021-08-28 NOTE — Discharge Summary (Signed)
[facility] NAME: Karen Farley    MR#:  [id number]  DATE OF BIRTH:  12/27/1945  DATE OF ADMISSION:  07/26/2021 ADMITTING PHYSICIAN: Orene Desanctis, DO  DATE OF DISCHARGE: 08/28/2021  PRIMARY CARE PHYSICIAN: Valerie Roys, DO    ADMISSION DIAGNOSIS:  Morbid obesity ([place]) [E66.01] DOE (dyspnea on exertion) [R06.09] Recurrent left pleural effusion [J90] Hypotension [I95.9] Type 2 diabetes mellitus without complication, without long-term current use of insulin (HCC) [E11.9] Atrial fibrillation, unspecified type ([place]) [I48.91] Chronic congestive heart failure, unspecified heart failure type ([place]) [I50.9]  DISCHARGE DIAGNOSIS:  Severe Anasarca with Acute on Chronic CHF Pulmonary HTN Large Pericardial effusion--s/p pericardiocentesis Large Pleural effusion--s/p rec thoracentesis 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 11/2019 Echo: EF 60-65%, no rwma, Nl RV size/fxn. Mod dil LA.   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 WF PMHx HFpEF, permanent atrial fibrillation on Eliquis, PAD, type 2 diabetes, CKD stage IIIb, lymphedema   Presented to hospital with increasing shortness of breath and edema.  Patient has been noticing increasing abdominal distention since her admission in August with severe lower extremity edema and dyspnea and exertion.   Severe Anasarca acute on chronic diastolic heart failure pulmonary hypertension   -status post thoracocentesis with removal of  600 mL of fluid on 07/28/2021. --10/20-- torsemide 20 mg qd with IV albumin --10/21-- IV lqasix 40 mg bid per cardiology --10/22 cont IV lasix--good uop --10/23--IV lasix --10/24-- Dr Juleen China wants to start IV lasxi gtt --10/25-cont Lasix gtt -11/1 decrease Metoprolol 12.5 mg BID per cardiology -11/1 Zaroxolyn 2.5 mg x 1 --11/2--IV lasix gtt + zaroxylyn total17 liters UOP, weight trending down --11/3--cont IV lasix gtt. Get Echo to see status of pericardial effusion --11/4-- continue Lasix drip per Dr. Rockey Situ. He wants to keep patient over the weekend. -- Repeat Echo 11/3-- moderate pericardial effusion. No evidence of tamponade --11/5 Lasix gtt + zaroxylyn 2.5 x1 --11/6-- lasix gtt --11/7-- Dr Fletcher Anon ok to d/c lasix gtt and start po torsemide 40 mg qd, spironolactone 25 mg qd  and Metolazone 2.5 mg qd --11/8--cont po diuretics   Large pericardial effusion  -S/p percardiocentesis x1 10/16   Large Pleural effusion s/p thoracentesis Patient underwent a pericardiocentesis on 08/05/2021 with removal of 700 mL of pleural fluid.  Inflammatory markers were unremarkable.  Cardiology monitoring the patient while in the hospital.  Still on vasopressors due to hypotension. --repeat echo 10/21--no pericaridal effusion   Acute kidney injury on CKD stage 3b(Baseline Cr 1.4-1.7) - Currently at baseline.   --Nephrology followed the patient during hospitalization.  -- Benazepril on hold at d/c for now   Permanent atrial fibrillation --Was previously on Eliquis.  Currently on hold due to GI bleed.  -- Rate controlled at this time--not on any po rate controlling meds --10/21--started on IV heparin gtt--monitor hgb  --10/22-- hgb remains stable --10/23-- discussed with G.I. Dr. Virgina Jock  and cardiology Dr. Rockey Situ plan is to move forward with starting eliquis while patient is in-house to make sure she tolerates. DC heparin drip. Patient is in agreement with plan. --hgb stable   GI bleed with hemorrhagic  shock. --Patient was noted to have significant drop in hemoglobin to 9.7 from 12.4 and was transferred to the ICU.   --GI Dr Alice Reichert from 10/19-- May forego endoluminal evaluation for the near future given lack of bleeding recurrence and persistence of cardiopulmonary issues. Patient is in agreement. --10/23-- d/w Dr Russo--no Rectal bleed. Ok to resume po eliquis     DM type II with hyperglycemia Diet controlled at home.   -- Sensitive SSI   Iron deficiency/Vitamin B12  --patient received Venofer -cont oral iron and vitamin B12 supplements.    Procedures: 10/8 s/p left thoracentesis: 659ml aspirated 10/12 paracentesis 1.2L aspirated .  10/16 Echocardiogram performed: Pericardiocentesis 735ml aspirated and drain placed  Failed Lasix IV advanced to Lasix gtt; however, Lasix and Eliquis stopped 10/15 and patient transferred to ICU after had episode of GI bleed with drop in hgb,  s/p 2U pRBC and vasopressor support initiated.   10/21 Echocardiogram LVEF= 60 to 65%.  -Pulmonary HTN: Assumed right atrial pressure of 15 mmHg, the  estimated right ventricular systolic pressure is 96.2 mmHg.  Pericardium: A small pericardial effusion is present.  10/27 LEFT thoracentesis: Aspirated 700 mL of clear yellow fluid was removed.   DVT prophylaxis: Eliquis  Code Status: Full Family Communication: none today Status is: Inpatient       Dispo: The patient is from: Home              Anticipated d/c is to: Home              Anticipated d/c date is: 11/8              Patient currently is medically stable to d/c.  CONSULTS OBTAINED:  Treatment Team:  Ottie Glazier, MD Constance Haw, MD [name], MD  DRUG ALLERGIES:  No Known Allergies  DISCHARGE MEDICATIONS:   Allergies as of 08/28/2021   No Known Allergies      Medication List     STOP taking these medications    Accu-Chek Guide test strip Generic drug: glucose blood       TAKE these medications     apixaban 5 MG Tabs tablet Commonly known as: ELIQUIS Take 1 tablet (5 mg total) by mouth 2 (two) times daily.   docusate sodium 100 MG capsule Commonly known as: COLACE Take 1 capsule (100 mg total) by mouth daily.   folic acid 1 MG tablet Commonly known as: FOLVITE Take 1 tablet (1 mg total) by mouth daily.   hydrocortisone 2.5 % rectal cream Commonly known as: ANUSOL-HC Place rectally 3 (three) times daily.   iron polysaccharides 150 MG capsule Commonly known as: NIFEREX Take 1 capsule (150 mg total) by mouth daily.   latanoprost 0.005 % ophthalmic solution Commonly known as: XALATAN Place 1 drop into both eyes at bedtime.   linagliptin 5 MG Tabs tablet Commonly known as: Tradjenta Take 1 tablet (5 mg total) by mouth daily.   Medical Compression Stockings Misc by Does not apply route.   metolazone 2.5 MG tablet Commonly known as: ZAROXOLYN Take 1 tablet (2.5 mg total) by mouth daily.   metoprolol succinate 25 MG 24 hr tablet Commonly known as: TOPROL-XL Take 0.5 tablets (12.5 mg total) by mouth daily. What changed:  medication strength  See the new instructions.   midodrine 10 MG tablet Commonly known as: PROAMATINE Take 1 tablet (10 mg total) by mouth 3 (three) times daily with meals.   multivitamin with minerals Tabs tablet Take 1 tablet by mouth daily.   pantoprazole 40 MG tablet Commonly known as: PROTONIX Take 1 tablet (40 mg total) by mouth 2 (two) times daily.   polyethylene glycol 17 g packet Commonly known as: MIRALAX / GLYCOLAX Take 17 g by mouth daily.   potassium chloride SA 20 MEQ tablet Commonly known as: KLOR-CON Take 1 tablet (20 mEq total) by mouth daily.   spironolactone 25 MG tablet Commonly known as: ALDACTONE Take 1 tablet (25 mg total) by mouth daily.   Torsemide 40 MG Tabs Take 40 mg by mouth daily. What changed:  medication strength how much to take   traMADol 50 MG tablet Commonly known as: ULTRAM Take 1 tablet (50  mg total) by mouth every 12 (twelve) hours as needed for moderate pain.   vitamin B-12 500 MCG tablet Commonly known as: CYANOCOBALAMIN Take 1 tablet (500 mcg total) by mouth daily.               Discharge Care Instructions  (From admission, onward)           Start     Ordered   08/28/21 0000  Discharge wound care:       Comments: Use ACE compression wrap as before   08/28/21 0850            If you experience worsening of your admission symptoms, develop shortness of breath, life threatening emergency, suicidal or homicidal thoughts you must seek medical attention immediately by calling 911 or calling your MD immediately  if symptoms less severe.  You Must read complete instructions/literature along with all the possible adverse reactions/side effects for all the Medicines you take and that have been prescribed to you. Take any new Medicines after you have completely understood and accept all the possible adverse reactions/side effects.   Please note  You were cared for by a hospitalist during your hospital stay. If you have any questions about your discharge medications or the care you received while you were in the hospital after you are discharged, you can call the unit and asked to speak with the hospitalist on call if the hospitalist that took care of you is not available. Once you are discharged, your primary care physician will handle any further medical issues. Please note that NO REFILLS for any discharge medications will be authorized once you are discharged, as it is imperative that you return to your primary care physician (or establish a relationship with a primary care physician if you do not have one) for your aftercare needs so that they can reassess your need for medications and monitor your lab values. Today   SUBJECTIVE   No new complaints Pt is excited to go voting today !!  VITAL SIGNS:  Blood pressure 114/75, pulse (!) 101, temperature 98.1 F  (36.7 C), resp. rate 18, height 5\' 2"  (1.575 m), weight 87.6 kg, SpO2 95 %.  I/O:   Intake/Output Summary (Last 24 hours) at 08/28/2021 0855 Last data filed at 08/27/2021 1727 Gross per 24 hour  Intake 720 ml  Output 2250 ml  Net -1530 ml    PHYSICAL EXAMINATION:  GENERAL:  75 y.o.-year-old patient lying in the bed with no acute distress. obese HEENT: Head atraumatic, normocephalic. Oropharynx and nasopharynx clear.  LUNGS: decreased breath sounds  bilaterally, no wheezing, rales, rhonchi. No use of accessory muscles of respiration.  CARDIOVASCULAR: S1, S2 normal. No murmurs, rubs, or gallops.  ABDOMEN: Soft, nontender, nondistended. Bowel sounds present. No organomegaly or mass.  EXTREMITIES: On 08/26/2021 NEUROLOGIC: non-focal PSYCHIATRIC:  patient is alert and oriented x 3.  SKIN: as above   DATA REVIEW:   CBC  Recent Labs  Lab 08/26/21 0500  WBC 4.5  HGB 10.8*  HCT 35.2*  PLT 370    Chemistries  Recent Labs  Lab 08/25/21 0500 08/26/21 0500  NA 136 136  K 3.4* 3.4*  CL 90* 91*  CO2 35* 35*  GLUCOSE 170* 207*  BUN 46* 51*  CREATININE 1.55* 1.87*  CALCIUM 9.3 9.5  MG 2.2  --   AST 26  --   ALT 14  --   ALKPHOS 84  --   BILITOT 1.6*  --     Microbiology Results   No results found for this or any previous visit (from the past 240 hour(s)).  RADIOLOGY:  No results found.   CODE STATUS:     Code Status Orders  (From admission, onward)           Start     Ordered   07/26/21 2042  Full code  Continuous        07/26/21 2042           Code Status History     Date Active Date Inactive Code Status Order ID Comments User Context   05/17/2021 0253 05/27/2021 1956 Full Code [id number]  [name], MD ED   04/11/2021 0143 04/18/2021 1712 Full Code [id number]  Orene Desanctis, DO ED      Advance Directive Documentation    Flowsheet Row Most Recent Value  Type of Advance Directive Healthcare Power of [place], Living will  Pre-existing out of  facility DNR order (yellow form or pink MOST form) --  "MOST" Form in Place? --        TOTAL TIME TAKING CARE OF THIS PATIENT: 40 minutes.    Fritzi Mandes M.D  [facility]    CC: Primary care physician; Valerie Roys, DO

## 2021-08-28 NOTE — Progress Notes (Signed)
   Heart Failure Nurse Navigator Note  Met with patient and her husband, [name] who was at the bedside.  She states that she is being discharged home today.  I have asked her nurse Cody to make sure she is weighed before she is discharged so she has an idea of what she is weighing when she goes home.  She states that she has been put on a 40 ounce fluid restriction.  She will call to make her own appointment for the outpatient heart failure clinic.  I also gave her my office number and told her to call with any questions or concerns.  Jimsey Shaffer RN CHFN 

## 2021-08-28 NOTE — Plan of Care (Signed)

## 2021-08-28 NOTE — TOC Transition Note (Signed)
Transition of Care [facility]) - CM/SW Discharge Note   Patient Details  Name: Karen Farley MRN: [id number] Date of Birth: [birth year]  [facility] of [facility]) CM/SW Contact:  Alberteen Sam, LCSW Phone Number: 08/28/2021, 9:07 AM   Clinical Narrative:     CSW notes patient to discharge home today, have previously discussed PT recommendations of home health services at discharge in which patient and family declined.   CSW spoke with patient's spouse Karen Farley today regarding discharge, he reports he is picking patient up today and confirms that they decline home health services at this time. Reports patient has a walker at home and identified no other discharge needs at this time.   CSW signing off.    Final next level of care: Home/Self Care Barriers to Discharge: No Barriers Identified   Patient Goals and CMS Choice Patient states their goals for this hospitalization and ongoing recovery are:: to go home CMS Medicare.gov Compare Post Acute Care list provided to:: Patient Choice offered to / list presented to : Spouse  Discharge Placement                  Name of family member notified: spouse Patient and family notified of of transfer: 08/28/21  Discharge Plan and Services                                     Social Determinants of Health (SDOH) Interventions     Readmission Risk Interventions No flowsheet data found.

## 2021-08-28 NOTE — Progress Notes (Signed)
[facility]  ROUNDING NOTE   Subjective:   [name] was admitted to [facility] on 07/26/2021 for Morbid obesity ([place]) [E66.01] DOE (dyspnea on exertion) [R06.09] Recurrent left pleural effusion [J90] Hypotension [I95.9] Type 2 diabetes mellitus without complication, without long-term current use of insulin ([place]) [E11.9] Atrial fibrillation, unspecified type ([place]) [I48.91] Chronic congestive heart failure, unspecified heart failure type ([place]) [I50.9]  Patient seen sitting up in chair, alert and oriented Completing breakfast tray on arrival Edema greatly improved Patient feels prepared for discharge today   Objective:  Vital signs in last 24 hours:  Temp:  [98.1 F (36.7 C)-98.6 F (37 C)] 98.1 F (36.7 C) (11/08 0807) Pulse Rate:  [82-101] 101 (11/08 0807) Resp:  [16-19] 18 (11/08 0807) BP: (89-120)/(50-80) 114/75 (11/08 0807) SpO2:  [94 %-100 %] 95 % (11/08 0807) Weight:  [88 kg] 88 kg (11/08 1046)  Weight change:  Filed Weights   08/26/21 0521 08/27/21 0503 08/28/21 1046  Weight: 88.8 kg 87.6 kg 88 kg    Intake/Output: I/O last 3 completed shifts: In: 009 [P.O.:957] Out: 4100 [Urine:4100]   Intake/Output this shift:  Total I/O In: 360 [P.O.:360] Out: -   Physical Exam: General: In no acute distress  Head: oral mucosal membranes moist  Lungs:  Clear bilaterally, normal effort  Heart: S1S2, irregular  Abdomen:  Soft, Non tender  Extremities:  + peripheral edema. Anasarca +  Neurologic: Awake, alert, able to answer questions appropriately  Skin: No acute lesions or rashes        Basic Metabolic Panel: Recent Labs  Lab 08/22/21 0610 08/23/21 0800 08/24/21 0748 08/25/21 0500 08/26/21 0500 08/28/21 0844  NA 139 137 138 136 136 137  K 3.5 3.4* 3.5 3.4* 3.4* 3.8  CL 93* 90* 92* 90* 91* 91*  CO2 36* 37* 36* 35* 35* 33*  GLUCOSE 172* 167* 153* 170* 207* 229*  BUN 45* 49* 43* 46* 51* 53*  CREATININE 1.70* 1.67* 1.70* 1.55* 1.87*  2.07*  CALCIUM 9.6 9.6 9.4 9.3 9.5 9.9  MG 2.1 2.0 2.1 2.2  --   --   PHOS 3.1 3.8 3.8 3.4  --   --      Liver Function Tests: Recent Labs  Lab 08/22/21 0610 08/23/21 0800 08/24/21 0748 08/25/21 0500  AST 22 23 30 26   ALT 15 15 15 14   ALKPHOS 78 84 80 84  BILITOT 2.0* 1.9* 1.7* 1.6*  PROT 6.1* 6.5 6.2* 6.2*  ALBUMIN 3.6 3.7 3.5 3.6    No results for input(s): LIPASE, AMYLASE in the last 168 hours. No results for input(s): AMMONIA in the last 168 hours.  CBC: Recent Labs  Lab 08/22/21 0610 08/23/21 0800 08/26/21 0500  WBC 4.1 4.0 4.5  NEUTROABS 2.7 2.7  --   HGB 10.2* 10.4* 10.8*  HCT 32.8* 33.9* 35.2*  MCV 88.4 89.2 86.1  PLT 365 348 370     Cardiac Enzymes: No results for input(s): CKTOTAL, CKMB, CKMBINDEX, TROPONINI in the last 168 hours.  BNP: Invalid input(s): POCBNP  CBG: Recent Labs  Lab 08/27/21 0731 08/27/21 1130 08/27/21 1635 08/27/21 2316 08/28/21 0807  GLUCAP 206* 365* 244* 191* 221*     Microbiology: Results for orders placed or performed during the hospital encounter of 07/26/21  Resp Panel by RT-PCR (Flu A&B, Covid) Nasopharyngeal Swab     Status: None   Collection Time: 07/26/21  7:19 PM   Specimen: Nasopharyngeal Swab; Nasopharyngeal(NP) swabs in vial transport medium  Result Value Ref  Range Status   SARS Coronavirus 2 by RT PCR NEGATIVE NEGATIVE Final    Comment: (NOTE) SARS-CoV-2 target nucleic acids are NOT DETECTED.  The SARS-CoV-2 RNA is generally detectable in upper respiratory specimens during the acute phase of infection. The lowest concentration of SARS-CoV-2 viral copies this assay can detect is 138 copies/mL. A negative result does not preclude SARS-Cov-2 infection and should not be used as the sole basis for treatment or other patient management decisions. A negative result may occur with  improper specimen collection/handling, submission of specimen other than nasopharyngeal swab, presence of viral mutation(s)  within the areas targeted by this assay, and inadequate number of viral copies(<138 copies/mL). A negative result must be combined with clinical observations, patient history, and epidemiological information. The expected result is Negative.  Fact Sheet for Patients:  EntrepreneurPulse.com.au  Fact Sheet for Healthcare Providers:  IncredibleEmployment.be  This test is no t yet approved or cleared by the [place] FDA and  has been authorized for detection and/or diagnosis of SARS-CoV-2 by FDA under an Emergency Use Authorization (EUA). This EUA will remain  in effect (meaning this test can be used) for the duration of the COVID-19 declaration under Section 564(b)(1) of the Act, 21 U.S.C.section 360bbb-3(b)(1), unless the authorization is terminated  or revoked sooner.       Influenza A by PCR NEGATIVE NEGATIVE Final   Influenza B by PCR NEGATIVE NEGATIVE Final    Comment: (NOTE) The Xpert Xpress SARS-CoV-2/FLU/RSV plus assay is intended as an aid in the diagnosis of influenza from Nasopharyngeal swab specimens and should not be used as a sole basis for treatment. Nasal washings and aspirates are unacceptable for Xpert Xpress SARS-CoV-2/FLU/RSV testing.  Fact Sheet for Patients: EntrepreneurPulse.com.au  Fact Sheet for Healthcare Providers: IncredibleEmployment.be  This test is not yet approved or cleared by the [place] FDA and has been authorized for detection and/or diagnosis of SARS-CoV-2 by FDA under an Emergency Use Authorization (EUA). This EUA will remain in effect (meaning this test can be used) for the duration of the COVID-19 declaration under Section 564(b)(1) of the Act, 21 U.S.C. section 360bbb-3(b)(1), unless the authorization is terminated or revoked.  Performed at [facility], [address] [place], [place] [address]   Body fluid culture w Gram Stain     Status:  None   Collection Time: 07/28/21  1:53 PM   Specimen: PATH Cytology Pleural fluid  Result Value Ref Range Status   Specimen Description   Final    PLEURAL Performed at [facility], [address] [place], [place] [address]    Special Requests   Final    NONE Performed at [facility], [address] [place], [place] [address]    Gram Stain   Final    NO SQUAMOUS EPITHELIAL CELLS SEEN FEW WBC SEEN NO ORGANISMS SEEN    Culture   Final    NO GROWTH 3 DAYS Performed at [facility], [place] [address] [place], [place] [address]    Report Status 08/01/2021 FINAL  Final  Body fluid culture w Gram Stain     Status: None   Collection Time: 08/01/21  3:36 PM   Specimen: PATH Cytology Peritoneal fluid  Result Value Ref Range Status   Specimen Description   Final    PERITONEAL Performed at [facility], [address], [place], [place] [address]    Special Requests   Final    NONE Performed at [facility],  [place], [place] [address]    Gram Stain   Final    RARE WBC PRESENT,BOTH PMN AND MONONUCLEAR NO ORGANISMS SEEN    Culture   Final    NO GROWTH 3 DAYS Performed at [facility], [place] [address], [place], [place] [address]    Report Status 08/05/2021 FINAL  Final  MRSA Next Gen by PCR, Nasal     Status: None   Collection Time: 08/04/21  8:56 PM   Specimen: Nasal Mucosa; Nasal Swab  Result Value Ref Range Status   MRSA by PCR Next Gen NOT DETECTED NOT DETECTED Final    Comment: (NOTE) The GeneXpert MRSA Assay (FDA approved for NASAL specimens only), is one component of a comprehensive MRSA colonization surveillance program. It is not intended to diagnose MRSA infection nor to guide or monitor treatment for MRSA infections. Test performance is not FDA approved in patients less than 31 years old. Performed at [facility], [address] [place], [place] [address]   Body fluid culture w Gram  Stain     Status: None   Collection Time: 08/05/21  5:50 PM   Specimen: Pericardial  Result Value Ref Range Status   Specimen Description   Final    PERICARDIAL Performed at [facility], [address], [place], [place] [address]    Special Requests   Final    NONE Performed at [facility], [address] [place], [place] [address]    Gram Stain   Final    FEW WBC PRESENT, PREDOMINANTLY MONONUCLEAR NO ORGANISMS SEEN    Culture   Final    NO GROWTH 3 DAYS Performed at [facility], [place] [address] [place], [place] [address]    Report Status 08/09/2021 FINAL  Final  Fungus Culture With Stain     Status: None (Preliminary result)   Collection Time: 08/16/21 11:46 AM   Specimen: PATH Cytology Pleural fluid  Result Value Ref Range Status   Fungus Stain Final report  Final    Comment: (NOTE) Performed At: [facility] [place], [place] [id number] Rush Farmer MD [id number]    Fungus (Mycology) Culture PENDING  Incomplete   Fungal Source PLEURAL  Final    Comment: Performed at [facility], [address] [place], [place] [address]  Acid Fast Smear (AFB)     Status: None   Collection Time: 08/16/21 11:46 AM   Specimen: PATH Cytology Pleural fluid  Result Value Ref Range Status   AFB Specimen Processing Concentration  Final   Acid Fast Smear Negative  Final    Comment: (NOTE) Performed At: [facility] [place], [place] [id number] Rush Farmer MD [id number]    Source (AFB) PLEURAL  Final    Comment: Performed at [facility], [address] [place], [place] [address]  Body fluid culture w Gram Stain     Status: None   Collection Time: 08/16/21 11:46 AM   Specimen: PATH Cytology Pleural fluid  Result Value Ref Range Status   Specimen Description   Final    PLEURAL Performed at [facility], [address] [place], [place] [address]    Special Requests    Final    NONE Performed at [facility], [address] [place], [place] [address]    Gram Stain   Final    WBC PRESENT, PREDOMINANTLY MONONUCLEAR NO ORGANISMS SEEN CYTOSPIN SMEAR    Culture  Final    NO GROWTH 3 DAYS Performed at [facility], [place] [address] [place], [place] [address]    Report Status 08/19/2021 FINAL  Final  Fungus Culture Result     Status: None   Collection Time: 08/16/21 11:46 AM  Result Value Ref Range Status   Result 1 Comment  Final    Comment: (NOTE) KOH/Calcofluor preparation:  no fungus observed. Performed At: [facility] [place], [place] [id number] Rush Farmer MD [id number]     Coagulation Studies: No results for input(s): LABPROT, INR in the last 72 hours.   Urinalysis: No results for input(s): COLORURINE, LABSPEC, PHURINE, GLUCOSEU, HGBUR, BILIRUBINUR, KETONESUR, PROTEINUR, UROBILINOGEN, NITRITE, LEUKOCYTESUR in the last 72 hours.  Invalid input(s): APPERANCEUR    Imaging: No results found.   Medications:    sodium chloride Stopped (08/18/21 0325)    apixaban  5 mg Oral BID   Chlorhexidine Gluconate Cloth  6 each Topical Daily   docusate sodium  100 mg Oral Daily   folic acid  1 mg Oral Daily   hydrocortisone   Rectal TID   insulin aspart  0-5 Units Subcutaneous QHS   insulin aspart  0-9 Units Subcutaneous TID WC   iron polysaccharides  150 mg Oral Daily   latanoprost  1 drop Both Eyes QHS   metolazone  2.5 mg Oral Daily   metoprolol succinate  12.5 mg Oral Daily   midodrine  10 mg Oral TID WC   pantoprazole  40 mg Oral BID   polyethylene glycol  17 g Oral Daily   potassium chloride  40 mEq Oral TID   Ensure Max Protein  11 oz Oral BID   spironolactone  25 mg Oral Daily   torsemide  40 mg Oral Daily   vitamin B-12  500 mcg Oral Daily     Assessment/ Plan:  [name] is a 75 y.o. white female with diastolic congestive heart failure, hypertension,  hyperlipidemia, lymphedema, atrial fibrillation, peripheral vascular disease who is admitted to [facility] on 07/26/2021 for Morbid obesity ([place]) [E66.01] DOE (dyspnea on exertion) [R06.09] Recurrent left pleural effusion [J90] Hypotension [I95.9] Type 2 diabetes mellitus without complication, without long-term current use of insulin ([place]) [E11.9] Atrial fibrillation, unspecified type ([place]) [I48.91] Chronic congestive heart failure, unspecified heart failure type ([place]) [I50.9]  #Acute kidney injury on chronic kidney disease stage IIIB: with baseline creatinine of 1.66, GFR of 36 on 07/13/21. History of bland urine. 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 Hold benazepril.  Creatinine remained stable.  2.6 L of urine recorded from day shift yesterday.  Transition to oral diuretic therapy yesterday.  We will recommend continuing torsemide 40 mg twice daily, spironolactone 25 mg daily and metolazone 5 mg twice weekly at discharge.  We will schedule follow-up appointment at our office for management.  #Hypotension Blood Pressure remains soft 114/75 Continue midodrine 10 mg 3 times a day  #Acute exacerbation of diastolic congestive heart failure:  ECHO performed on 08/24/21 shows moderate pericardial effusion Cardiology team following and monitoring.  Continue torsemide 40 mg, spironolactone 25 mg and metolazone 5 mg twice weekly.   LOS: [place] 11/8/202211:05 AM

## 2021-08-28 NOTE — Progress Notes (Signed)
Progress Note  Patient Name: Karen Farley Date of Encounter: 08/28/2021  [facility] Cardiologist: Kate Sable, MD   Subjective   She continues to feel well this morning. She is hopeful to go home today and had her husband bring her things for discharge today. She is without angina, less SOB, and notes significant improvement in her lower extremity edema. She ambulated x 2 yesterday, and worked with PT. She was changed to torsemide yesterday with documented UOP of 1.9 L for the past 24 hours, and net -28.3 L for the admission. Weight is pending this morning, though is down approximately 15 kg for the admission. Labs pending this morning. BP soft, tough stable.   Inpatient Medications    Scheduled Meds:  apixaban  5 mg Oral BID   Chlorhexidine Gluconate Cloth  6 each Topical Daily   docusate sodium  100 mg Oral Daily   folic acid  1 mg Oral Daily   hydrocortisone   Rectal TID   insulin aspart  0-5 Units Subcutaneous QHS   insulin aspart  0-9 Units Subcutaneous TID WC   iron polysaccharides  150 mg Oral Daily   latanoprost  1 drop Both Eyes QHS   metolazone  2.5 mg Oral Daily   metoprolol succinate  12.5 mg Oral Daily   midodrine  10 mg Oral TID WC   pantoprazole  40 mg Oral BID   polyethylene glycol  17 g Oral Daily   potassium chloride  40 mEq Oral TID   Ensure Max Protein  11 oz Oral BID   spironolactone  25 mg Oral Daily   torsemide  40 mg Oral Daily   vitamin B-12  500 mcg Oral Daily   Continuous Infusions:  sodium chloride Stopped (08/18/21 0325)   PRN Meds: acetaminophen, bisacodyl, lidocaine HCl (PF), lip balm, traMADol   Vital Signs    Vitals:   08/27/21 1527 08/27/21 2000 08/28/21 0053 08/28/21 0510  BP: 113/80 (!) 97/58 100/62 (!) 89/50  Pulse: 82 84 90 85  Resp: 18 18 19 16   Temp: 98.1 F (36.7 C) 98.6 F (37 C) 98.5 F (36.9 C) 98.5 F (36.9 C)  TempSrc:  Oral Oral Oral  SpO2: 100% 99% 99% 94%  Weight:      Height:         Intake/Output Summary (Last 24 hours) at 08/28/2021 0756 Last data filed at 08/27/2021 1727 Gross per 24 hour  Intake 720 ml  Output 2250 ml  Net -1530 ml   Last 3 Weights 08/27/2021 08/26/2021 08/25/2021  Weight (lbs) 193 lb 3.2 oz 195 lb 12.3 oz 197 lb 8.5 oz  Weight (kg) 87.635 kg 88.8 kg 89.6 kg      Telemetry    Atrial fibrillation with ventricular rates mostly in the 80s bpm, into the 110s bpm this morning - Personally Reviewed  ECG    No new tracings - Personally Reviewed  Physical Exam   GEN: Soft-spoken Neck: No JVD Cardiac: Irregular irregular Respiratory: Lungs are clear to auscultation GI: Soft, nontender, non-distended  MS: +1 edema significantly improved from before, with chronic hyperpigmentation Neuro:  Nonfocal  Psych: Normal affect   Labs    High Sensitivity Troponin:  No results for input(s): TROPONINIHS in the last 720 hours.   Chemistry Recent Labs  Lab 08/23/21 0800 08/24/21 0748 08/25/21 0500 08/26/21 0500  NA 137 138 136 136  K 3.4* 3.5 3.4* 3.4*  CL 90* 92* 90* 91*  CO2 37* 36* 35*  35*  GLUCOSE 167* 153* 170* 207*  BUN 49* 43* 46* 51*  CREATININE 1.67* 1.70* 1.55* 1.87*  CALCIUM 9.6 9.4 9.3 9.5  MG 2.0 2.1 2.2  --   PROT 6.5 6.2* 6.2*  --   ALBUMIN 3.7 3.5 3.6  --   AST 23 30 26   --   ALT 15 15 14   --   ALKPHOS 84 80 84  --   BILITOT 1.9* 1.7* 1.6*  --   GFRNONAA 32* 31* 35* 28*  ANIONGAP 10 10 11 10     Lipids No results for input(s): CHOL, TRIG, HDL, LABVLDL, LDLCALC, CHOLHDL in the last 168 hours.  Hematology Recent Labs  Lab 08/22/21 0610 08/23/21 0800 08/26/21 0500  WBC 4.1 4.0 4.5  RBC 3.71* 3.80* 4.09  HGB 10.2* 10.4* 10.8*  HCT 32.8* 33.9* 35.2*  MCV 88.4 89.2 86.1  MCH 27.5 27.4 26.4  MCHC 31.1 30.7 30.7  RDW 25.1* 24.5* 23.4*  PLT 365 348 370   Thyroid No results for input(s): TSH, FREET4 in the last 168 hours.  BNPNo results for input(s): BNP, PROBNP in the last 168 hours.  DDimer No results for  input(s): DDIMER in the last 168 hours.   Radiology    No results found.  Cardiac Studies   Limited Ttecho 08/23/2021   1. Left pleural effusion noted, 5 cm   2. Moderate pericardial effusion, estimated 1.9 off the LV free wall,  minimal off the RV free wall (0.4 cm) . There is no evidence of cardiac  tamponade.   3. Left ventricular ejection fraction, by estimation, is 60 to 65%. The  left ventricle has normal function. The left ventricle has no regional  wall motion abnormalities. There is mild left ventricular hypertrophy.   4. Right ventricular systolic function is normal. The right ventricular  size is normal.   5. Left atrial size was severely dilated  Patient Profile     75 y.o. female with history of HFpEF, hypertension, CKD 3, permanent atrial fibrillation presenting with hypotension, GI bleed and large pericardial effusion s/p pericardiocentesis.  Being seen for volume overload and HFpEF.  Assessment & Plan    Pericardial effusion: -S/p pericardiocentesis  -Last echo with moderate pericardial effusion -Recommend repeat echocardiogram in few weeks in the outpatient setting -Outpatient diuretics as below   2.  Acute on chronic diastolic heart failure with anasarca: -Significant improvement in volume overload on Lasix drip -Continue torsemide 40 mg once daily, (she was on 20 mg at home before admission) -In addition, recommend continuing small dose of metolazone 2.5 mg once daily and spironolactone 25 mg once daily -She will need follow ups in our office -If renal function is stable today, she can likely be discharged on current medications  3.  Permanent A. Fib: -Ventricular rates are overall well controlled  -Continue small dose Toprol for rate control and anticoagulation with Eliquis 5 mg twice daily       Signed, Christell Faith, PA-C  08/28/2021, 7:56 AM

## 2021-08-29 ENCOUNTER — Other Ambulatory Visit: Payer: Self-pay

## 2021-08-29 DIAGNOSIS — N1832 Chronic kidney disease, stage 3b: Secondary | ICD-10-CM

## 2021-08-30 ENCOUNTER — Inpatient Hospital Stay: Payer: Medicare PPO | Admitting: Family Medicine

## 2021-08-31 ENCOUNTER — Telehealth: Payer: Self-pay

## 2021-08-31 ENCOUNTER — Inpatient Hospital Stay: Payer: Medicare PPO | Admitting: Family Medicine

## 2021-08-31 NOTE — Telephone Encounter (Signed)
   Post hospital discharge phone call  Call to patient, she feels she is doing well.  Is weighing herself every morning and continues to lose weight.  Appetite is good.  She is sleeping well, only waking to go to the bathroom to void.  She got all her medications and sets up her morning medications the  night before.  She states she had not taken this many medications before.  Did not have any questions at this time.  She went out yesterday and got her hair done.  Tolerated that activity without difficulty.  She had a doctors appointment today but was canceled due to the weather.  Has 3 appointments next week.  She is still planning on calling to schedule the appointment for the outpt heart failure clinic.  Pricilla Riffle RN CHFN

## 2021-09-03 ENCOUNTER — Telehealth: Payer: Self-pay | Admitting: Cardiology

## 2021-09-03 DIAGNOSIS — I890 Lymphedema, not elsewhere classified: Secondary | ICD-10-CM | POA: Diagnosis not present

## 2021-09-03 DIAGNOSIS — N1832 Chronic kidney disease, stage 3b: Secondary | ICD-10-CM | POA: Diagnosis not present

## 2021-09-03 DIAGNOSIS — N179 Acute kidney failure, unspecified: Secondary | ICD-10-CM | POA: Diagnosis not present

## 2021-09-03 DIAGNOSIS — R600 Localized edema: Secondary | ICD-10-CM | POA: Diagnosis not present

## 2021-09-03 DIAGNOSIS — E1122 Type 2 diabetes mellitus with diabetic chronic kidney disease: Secondary | ICD-10-CM | POA: Diagnosis not present

## 2021-09-03 NOTE — Telephone Encounter (Signed)
Patient requesting switch from Dr Garen Lah to Dr Rockey Situ Per policy both providers must agree with change

## 2021-09-04 ENCOUNTER — Ambulatory Visit: Payer: Medicare PPO | Admitting: Family Medicine

## 2021-09-04 ENCOUNTER — Encounter: Payer: Self-pay | Admitting: Family Medicine

## 2021-09-04 ENCOUNTER — Other Ambulatory Visit: Payer: Self-pay

## 2021-09-04 VITALS — BP 106/70 | HR 83 | Temp 97.6°F | Wt 184.4 lb

## 2021-09-04 DIAGNOSIS — D500 Iron deficiency anemia secondary to blood loss (chronic): Secondary | ICD-10-CM

## 2021-09-04 DIAGNOSIS — I5033 Acute on chronic diastolic (congestive) heart failure: Secondary | ICD-10-CM

## 2021-09-04 DIAGNOSIS — R601 Generalized edema: Secondary | ICD-10-CM | POA: Diagnosis not present

## 2021-09-04 DIAGNOSIS — N179 Acute kidney failure, unspecified: Secondary | ICD-10-CM

## 2021-09-04 DIAGNOSIS — I270 Primary pulmonary hypertension: Secondary | ICD-10-CM | POA: Diagnosis not present

## 2021-09-04 DIAGNOSIS — I3139 Other pericardial effusion (noninflammatory): Secondary | ICD-10-CM | POA: Diagnosis not present

## 2021-09-04 DIAGNOSIS — J90 Pleural effusion, not elsewhere classified: Secondary | ICD-10-CM | POA: Diagnosis not present

## 2021-09-04 MED ORDER — POTASSIUM CHLORIDE CRYS ER 20 MEQ PO TBCR: 20.0000 meq | EXTENDED_RELEASE_TABLET | Freq: Every day | ORAL | 1 refills | Status: DC

## 2021-09-04 MED ORDER — FOLIC ACID 1 MG PO TABS: 1.0000 mg | ORAL_TABLET | Freq: Every day | ORAL | 3 refills | Status: DC

## 2021-09-04 MED ORDER — LINAGLIPTIN 5 MG PO TABS: 5.0000 mg | ORAL_TABLET | Freq: Every day | ORAL | 1 refills | Status: DC

## 2021-09-04 MED ORDER — CYANOCOBALAMIN 500 MCG PO TABS: 500.0000 ug | ORAL_TABLET | Freq: Every day | ORAL | 3 refills | Status: DC

## 2021-09-04 MED ORDER — SPIRONOLACTONE 25 MG PO TABS: 25.0000 mg | ORAL_TABLET | Freq: Every day | ORAL | 1 refills | Status: DC

## 2021-09-04 MED ORDER — PANTOPRAZOLE SODIUM 40 MG PO TBEC: 40.0000 mg | DELAYED_RELEASE_TABLET | Freq: Two times a day (BID) | ORAL | 3 refills | Status: DC

## 2021-09-04 MED ORDER — METOPROLOL SUCCINATE ER 25 MG PO TB24: 12.5000 mg | ORAL_TABLET | Freq: Every day | ORAL | 1 refills | Status: DC

## 2021-09-04 MED ORDER — MIDODRINE HCL 10 MG PO TABS: 10.0000 mg | ORAL_TABLET | Freq: Three times a day (TID) | ORAL | 1 refills | Status: DC

## 2021-09-04 NOTE — Progress Notes (Deleted)
BP 106/70   Pulse 83   Temp 97.6 F (36.4 C)   Wt 184 lb 6.4 oz (83.6 kg)   SpO2 100%   BMI 33.73 kg/m    Subjective:    Patient ID: Karen Farley, female    DOB: Sep 16, 1946, 75 y.o.   MRN: [id number]  HPI: Karen Farley is a 75 y.o. female  Chief Complaint  Patient presents with   Hospitalization Follow-up   Transition of [facility] Follow up.    Hospital/Facility: [facility] D/C Physician: Dr. Posey Pronto D/C Date: 08/28/21   Records Requested: 09/04/21 Records Received: 09/04/21 Records Reviewed: 09/04/21   Diagnoses on Discharge: Severe Anasarca with Acute on Chronic CHF Pulmonary HTN Large Pericardial effusion--s/p pericardiocentesis Large Pleural effusion--s/p rec thoracentesis Acute on CKD-3B Persistent Afib on po DOAC   Date of interactive Contact within 48 hours of discharge: 08/28/21- phone   Date of 7 day or 14 day face-to-face visit:  09/04/21  within 7 days       Outpatient Encounter Medications as of 09/04/2021  Medication Sig   apixaban (ELIQUIS) 5 MG TABS tablet Take 1 tablet (5 mg total) by mouth 2 (two) times daily.   docusate sodium (COLACE) 100 MG capsule Take 1 capsule (100 mg total) by mouth daily.   Elastic Bandages & Supports (MEDICAL COMPRESSION STOCKINGS) MISC by Does not apply route.   folic acid (FOLVITE) 1 MG tablet Take 1 tablet (1 mg total) by mouth daily.   hydrocortisone (ANUSOL-HC) 2.5 % rectal cream Place rectally 3 (three) times daily.   iron polysaccharides (NIFEREX) 150 MG capsule Take 1 capsule (150 mg total) by mouth daily.   latanoprost (XALATAN) 0.005 % ophthalmic solution Place 1 drop into both eyes at bedtime.   linagliptin (TRADJENTA) 5 MG TABS tablet Take 1 tablet (5 mg total) by mouth daily.   metolazone (ZAROXOLYN) 2.5 MG tablet Take 1 tablet (2.5 mg total) by mouth daily.   metoprolol succinate (TOPROL-XL) 25 MG 24 hr tablet Take 0.5 tablets (12.5 mg total) by mouth daily.   midodrine (PROAMATINE) 10 MG tablet Take  1 tablet (10 mg total) by mouth 3 (three) times daily with meals.   Multiple Vitamin (MULTIVITAMIN WITH MINERALS) TABS tablet Take 1 tablet by mouth daily.   pantoprazole (PROTONIX) 40 MG tablet Take 1 tablet (40 mg total) by mouth 2 (two) times daily.   polyethylene glycol (MIRALAX / GLYCOLAX) 17 g packet Take 17 g by mouth daily.   potassium chloride SA (KLOR-CON) 20 MEQ tablet Take 1 tablet (20 mEq total) by mouth daily.   spironolactone (ALDACTONE) 25 MG tablet Take 1 tablet (25 mg total) by mouth daily.   torsemide 40 MG TABS Take 40 mg by mouth daily.   traMADol (ULTRAM) 50 MG tablet Take 1 tablet (50 mg total) by mouth every 12 (twelve) hours as needed for moderate pain.   vitamin B-12 (CYANOCOBALAMIN) 500 MCG tablet Take 1 tablet (500 mcg total) by mouth daily.    No facility-administered encounter medications on file as of 09/04/2021.  Per Hospitalist: " HOSPITAL COURSE:    75 y.o. WF PMHx HFpEF, permanent atrial fibrillation on Eliquis, PAD, type 2 diabetes, CKD stage IIIb, lymphedema   Presented to hospital with increasing shortness of breath and edema.  Patient has been noticing increasing abdominal distention since her admission in August with severe lower extremity edema and dyspnea and exertion.   Severe Anasarca acute on chronic diastolic heart failure pulmonary hypertension   -status post  thoracocentesis with removal of 600 mL of fluid on 07/28/2021. --10/20-- torsemide 20 mg qd with IV albumin --10/21-- IV lqasix 40 mg bid per cardiology --10/22 cont IV lasix--good uop --10/23--IV lasix --10/24-- Dr Juleen China wants to start IV lasxi gtt --10/25-cont Lasix gtt -11/1 decrease Metoprolol 12.5 mg BID per cardiology -11/1 Zaroxolyn 2.5 mg x 1 --11/2--IV lasix gtt + zaroxylyn total17 liters UOP, weight trending down --11/3--cont IV lasix gtt. Get Echo to see status of pericardial effusion --11/4-- continue Lasix drip per Dr. Rockey Situ. He wants to keep patient over the  weekend. -- Repeat Echo 11/3-- moderate pericardial effusion. No evidence of tamponade --11/5 Lasix gtt + zaroxylyn 2.5 x1 --11/6-- lasix gtt --11/7-- Dr Fletcher Anon ok to d/c lasix gtt and start po torsemide 40 mg qd, spironolactone 25 mg qd  and Metolazone 2.5 mg qd --11/8--cont po diuretics   Large pericardial effusion  -S/p percardiocentesis x1 10/16   Large Pleural effusion s/p thoracentesis Patient underwent a pericardiocentesis on 08/05/2021 with removal of 700 mL of pleural fluid.  Inflammatory markers were unremarkable.  Cardiology monitoring the patient while in the hospital.  Still on vasopressors due to hypotension. --repeat echo 10/21--no pericaridal effusion   Acute kidney injury on CKD stage 3b(Baseline Cr 1.4-1.7) - Currently at baseline.   --Nephrology followed the patient during hospitalization.  -- Benazepril on hold at d/c for now   Permanent atrial fibrillation --Was previously on Eliquis.  Currently on hold due to GI bleed.  -- Rate controlled at this time--not on any po rate controlling meds --10/21--started on IV heparin gtt--monitor hgb  --10/22-- hgb remains stable --10/23-- discussed with G.I. Dr. Virgina Jock and cardiology Dr. Rockey Situ plan is to move forward with starting eliquis while patient is in-house to make sure she tolerates. DC heparin drip. Patient is in agreement with plan. --hgb stable   GI bleed with hemorrhagic shock. --Patient was noted to have significant drop in hemoglobin to 9.7 from 12.4 and was transferred to the ICU.   --GI Dr Alice Reichert from 10/19-- May forego endoluminal evaluation for the near future given lack of bleeding recurrence and persistence of cardiopulmonary issues. Patient is in agreement. --10/23-- d/w Dr Russo--no Rectal bleed. Ok to resume po eliquis    DM type II with hyperglycemia Diet controlled at home.   -- Sensitive SSI   Iron deficiency/Vitamin B12  --patient received Venofer -cont oral iron and vitamin B12 supplements.     Procedures: 10/8 s/p left thoracentesis: 619m aspirated 10/12 paracentesis 1.2L aspirated .  10/16 Echocardiogram performed: Pericardiocentesis 7033maspirated and drain placed  Failed Lasix IV advanced to Lasix gtt; however, Lasix and Eliquis stopped 10/15 and patient transferred to ICU after had episode of GI bleed with drop in hgb,  s/p 2U pRBC and vasopressor support initiated.   10/21 Echocardiogram LVEF= 60 to 65%.  -Pulmonary HTN: Assumed right atrial pressure of 15 mmHg, the  estimated right ventricular systolic pressure is 6196.2mHg.  Pericardium: A small pericardial effusion is present.  10/27 LEFT thoracentesis: Aspirated 700 mL of clear yellow fluid was removed"   Diagnostic Tests Reviewed   Disposition:    Consults: Pulmnology, cardiology,    Discharge Instructions: Follow up here, with cardiology and pulmonology   Disease/illness Education: Discussed today   Home Health/Community Services Discussions/Referrals: In place   Establishment or re-establishment of referral orders for community resources: In place   Discussion with other health care providers: N/A   Assessment and Support of treatment regimen adherence: Good  Appointments Coordinated with: Patient and husband   Education for self-management, independent living, and ADLs: Discussed today  Since getting out of the hospital, Kaylie has been feeling very tired, butshe is feeling like she is able to do things.   Relevant past medical, surgical, family and social history reviewed and updated as indicated. Interim medical history since our last visit reviewed. Allergies and medications reviewed and updated.  Review of Systems  Per HPI unless specifically indicated above     Objective:    BP 106/70   Pulse 83   Temp 97.6 F (36.4 C)   Wt 184 lb 6.4 oz (83.6 kg)   SpO2 100%   BMI 33.73 kg/m   Wt Readings from Last 3 Encounters:  09/04/21 184 lb 6.4 oz (83.6 kg)  08/28/21 193 lb 14.4 oz (88  kg)  07/13/21 215 lb 9.6 oz (97.8 kg)    Physical Exam  Results for orders placed or performed during the hospital encounter of 07/26/21  Resp Panel by RT-PCR (Flu A&B, Covid) Nasopharyngeal Swab   Specimen: Nasopharyngeal Swab; Nasopharyngeal(NP) swabs in vial transport medium  Result Value Ref Range   SARS Coronavirus 2 by RT PCR NEGATIVE NEGATIVE   Influenza A by PCR NEGATIVE NEGATIVE   Influenza B by PCR NEGATIVE NEGATIVE  Body fluid culture w Gram Stain   Specimen: PATH Cytology Pleural fluid  Result Value Ref Range   Specimen Description      PLEURAL Performed at [facility], [address], [place], [place] [address]    Special Requests      NONE Performed at [facility], [address] [place], [place] [address]    Gram Stain      NO SQUAMOUS EPITHELIAL CELLS SEEN FEW WBC SEEN NO ORGANISMS SEEN    Culture      NO GROWTH 3 DAYS Performed at [facility], [place] [address], [place], [place] [address]    Report Status 08/01/2021 FINAL   Body fluid culture w Gram Stain   Specimen: PATH Cytology Peritoneal fluid  Result Value Ref Range   Specimen Description      PERITONEAL Performed at [facility], [address] [place], [place] [address]    Special Requests      NONE Performed at [facility], [place], [place] [address]    Gram Stain      RARE WBC PRESENT,BOTH PMN AND MONONUCLEAR NO ORGANISMS SEEN    Culture      NO GROWTH 3 DAYS Performed at [facility], [place] [address], [place], [place] [address]    Report Status 08/05/2021 FINAL   MRSA Next Gen by PCR, Nasal   Specimen: Nasal Mucosa; Nasal Swab  Result Value Ref Range   MRSA by PCR Next Gen NOT DETECTED NOT DETECTED  Body fluid culture w Gram Stain   Specimen: Pericardial  Result Value Ref Range   Specimen Description      PERICARDIAL Performed at [facility], [address], [place], [place] [address]    Special  Requests      NONE Performed at [facility], [place], [place] [address]    Gram Stain      FEW WBC PRESENT, PREDOMINANTLY MONONUCLEAR NO ORGANISMS SEEN    Culture      NO GROWTH 3 DAYS Performed at [facility], [place] [address], [place],  [address]    Report Status 08/09/2021  FINAL   Fungus Culture With Stain   Specimen: PATH Cytology Pleural fluid  Result Value Ref Range   Fungus Stain Final report    Fungus (Mycology) Culture PENDING    Fungal Source PLEURAL   Acid Fast Smear (AFB)   Specimen: PATH Cytology Pleural fluid  Result Value Ref Range   AFB Specimen Processing Concentration    Acid Fast Smear Negative    Source (AFB) PLEURAL   Body fluid culture w Gram Stain   Specimen: PATH Cytology Pleural fluid  Result Value Ref Range   Specimen Description      PLEURAL Performed at [facility], [address], [place], [place] [address]    Special Requests      NONE Performed at [facility], [address] [place], [place] [address]    Gram Stain      WBC PRESENT, PREDOMINANTLY MONONUCLEAR NO ORGANISMS SEEN CYTOSPIN SMEAR    Culture      NO GROWTH 3 DAYS Performed at [facility], [place] [address] [place], [place] [address]    Report Status 08/19/2021 FINAL   Fungus Culture Result  Result Value Ref Range   Result 1 Comment   Basic metabolic panel  Result Value Ref Range   Sodium 128 (L) 135 - 145 mmol/L   Potassium 2.6 (LL) 3.5 - 5.1 mmol/L   Chloride 79 (L) 98 - 111 mmol/L   CO2 34 (H) 22 - 32 mmol/L   Glucose, Bld 195 (H) 70 - 99 mg/dL   BUN 65 (H) 8 - 23 mg/dL   Creatinine, Ser 1.78 (H) 0.44 - 1.00 mg/dL   Calcium 9.4 8.9 - 10.3 mg/dL   GFR, Estimated 29 (L) >60 mL/min   Anion gap 15 5 - 15  CBC  Result Value Ref Range   WBC 8.1 4.0 - 10.5 K/uL   RBC 5.51 (H) 3.87 - 5.11 MIL/uL   Hemoglobin 13.6 12.0 - 15.0 g/dL   HCT 42.5 36.0 - 46.0 %   MCV 77.1 (L) 80.0 - 100.0 fL   MCH 24.7 (L)  26.0 - 34.0 pg   MCHC 32.0 30.0 - 36.0 g/dL   RDW 18.9 (H) 11.5 - 15.5 %   Platelets 341 150 - 400 K/uL   nRBC 0.0 0.0 - 0.2 %  Brain natriuretic peptide  Result Value Ref Range   B Natriuretic Peptide 154.2 (H) 0.0 - 100.0 pg/mL  Magnesium  Result Value Ref Range   Magnesium 2.2 1.7 - 2.4 mg/dL  Hepatic function panel  Result Value Ref Range   Total Protein 6.1 (L) 6.5 - 8.1 g/dL   Albumin 3.2 (L) 3.5 - 5.0 g/dL   AST 27 15 - 41 U/L   ALT 16 0 - 44 U/L   Alkaline Phosphatase 68 38 - 126 U/L   Total Bilirubin 2.3 (H) 0.3 - 1.2 mg/dL   Bilirubin, Direct 0.7 (H) 0.0 - 0.2 mg/dL   Indirect Bilirubin 1.6 (H) 0.3 - 0.9 mg/dL  TSH  Result Value Ref Range   TSH 2.180 0.350 - 4.500 uIU/mL  Basic metabolic panel  Result Value Ref Range   Sodium 130 (L) 135 - 145 mmol/L   Potassium 2.8 (L) 3.5 - 5.1 mmol/L   Chloride 83 (L) 98 - 111 mmol/L   CO2 33 (H) 22 - 32 mmol/L   Glucose, Bld 147 (H) 70 - 99 mg/dL   BUN 63 (H) 8 - 23 mg/dL   Creatinine, Ser 1.57 (  H) 0.44 - 1.00 mg/dL   Calcium 9.4 8.9 - 10.3 mg/dL   GFR, Estimated 34 (L) >60 mL/min   Anion gap 14 5 - 15  Basic metabolic panel  Result Value Ref Range   Sodium 126 (L) 135 - 145 mmol/L   Potassium 3.5 3.5 - 5.1 mmol/L   Chloride 82 (L) 98 - 111 mmol/L   CO2 32 22 - 32 mmol/L   Glucose, Bld 201 (H) 70 - 99 mg/dL   BUN 62 (H) 8 - 23 mg/dL   Creatinine, Ser 1.85 (H) 0.44 - 1.00 mg/dL   Calcium 9.1 8.9 - 10.3 mg/dL   GFR, Estimated 28 (L) >60 mL/min   Anion gap 12 5 - 15  Basic metabolic panel  Result Value Ref Range   Sodium 129 (L) 135 - 145 mmol/L   Potassium 2.8 (L) 3.5 - 5.1 mmol/L   Chloride 82 (L) 98 - 111 mmol/L   CO2 33 (H) 22 - 32 mmol/L   Glucose, Bld 138 (H) 70 - 99 mg/dL   BUN 59 (H) 8 - 23 mg/dL   Creatinine, Ser 1.68 (H) 0.44 - 1.00 mg/dL   Calcium 9.1 8.9 - 10.3 mg/dL   GFR, Estimated 32 (L) >60 mL/min   Anion gap 14 5 - 15  CBC with Differential/Platelet  Result Value Ref Range   WBC 6.4 4.0 - 10.5  K/uL   RBC 5.57 (H) 3.87 - 5.11 MIL/uL   Hemoglobin 13.2 12.0 - 15.0 g/dL   HCT 42.2 36.0 - 46.0 %   MCV 75.8 (L) 80.0 - 100.0 fL   MCH 23.7 (L) 26.0 - 34.0 pg   MCHC 31.3 30.0 - 36.0 g/dL   RDW 18.8 (H) 11.5 - 15.5 %   Platelets 354 150 - 400 K/uL   nRBC 0.0 0.0 - 0.2 %   Neutrophils Relative % 74 %   Neutro Abs 4.8 1.7 - 7.7 K/uL   Lymphocytes Relative 10 %   Lymphs Abs 0.6 (L) 0.7 - 4.0 K/uL   Monocytes Relative 12 %   Monocytes Absolute 0.8 0.1 - 1.0 K/uL   Eosinophils Relative 2 %   Eosinophils Absolute 0.2 0.0 - 0.5 K/uL   Basophils Relative 1 %   Basophils Absolute 0.1 0.0 - 0.1 K/uL   Immature Granulocytes 1 %   Abs Immature Granulocytes 0.03 0.00 - 0.07 K/uL  Triglycerides, Body Fluid  Result Value Ref Range   Triglycerides, Fluid 31 Not Estab. mg/dL   Fluid Type-FTRIG CYTO PLEU   Body fluid cell count with differential  Result Value Ref Range   Fluid Type-FCT CYTO PLEU    Color, Fluid YELLOW (A) YELLOW   Appearance, Fluid HAZY (A) CLEAR   Total Nucleated Cell Count, Fluid 672 cu mm   Neutrophil Count, Fluid 24 %   Lymphs, Fluid 23 %   Monocyte-Macrophage-Serous Fluid 53 %   Eos, Fluid 0 %   Other Cells, Fluid 0 %  Glucose, pleural or peritoneal fluid  Result Value Ref Range   Glucose, Fluid 168 mg/dL   Fluid Type-FGLU CYTO PLEU   Miscellaneous [facility] test (send-out)  Result Value Ref Range   [facility] test code [id number]    [facility] test name FLUID PH    Misc [facility] result COMMENT   Pathologist smear review  Result Value Ref Range   Path Review Cytospin slide of left pleural fluid reviewed.   Basic metabolic panel  Result Value Ref Range   Sodium 127 (L)  135 - 145 mmol/L   Potassium 3.5 3.5 - 5.1 mmol/L   Chloride 86 (L) 98 - 111 mmol/L   CO2 32 22 - 32 mmol/L   Glucose, Bld 229 (H) 70 - 99 mg/dL   BUN 55 (H) 8 - 23 mg/dL   Creatinine, Ser 1.56 (H) 0.44 - 1.00 mg/dL   Calcium 9.3 8.9 - 10.3 mg/dL   GFR, Estimated 34 (L) >60 mL/min   Anion gap 9 5 -  15  Basic metabolic panel  Result Value Ref Range   Sodium 129 (L) 135 - 145 mmol/L   Potassium 3.5 3.5 - 5.1 mmol/L   Chloride 87 (L) 98 - 111 mmol/L   CO2 32 22 - 32 mmol/L   Glucose, Bld 152 (H) 70 - 99 mg/dL   BUN 51 (H) 8 - 23 mg/dL   Creatinine, Ser 1.52 (H) 0.44 - 1.00 mg/dL   Calcium 9.3 8.9 - 10.3 mg/dL   GFR, Estimated 36 (L) >60 mL/min   Anion gap 10 5 - 15  Glucose, capillary  Result Value Ref Range   Glucose-Capillary 202 (H) 70 - 99 mg/dL  Glucose, capillary  Result Value Ref Range   Glucose-Capillary 172 (H) 70 - 99 mg/dL  Basic metabolic panel  Result Value Ref Range   Sodium 128 (L) 135 - 145 mmol/L   Potassium 4.8 3.5 - 5.1 mmol/L   Chloride 87 (L) 98 - 111 mmol/L   CO2 28 22 - 32 mmol/L   Glucose, Bld 205 (H) 70 - 99 mg/dL   BUN 48 (H) 8 - 23 mg/dL   Creatinine, Ser 1.85 (H) 0.44 - 1.00 mg/dL   Calcium 9.5 8.9 - 10.3 mg/dL   GFR, Estimated 28 (L) >60 mL/min   Anion gap 13 5 - 15  CBC with Differential/Platelet  Result Value Ref Range   WBC 11.3 (H) 4.0 - 10.5 K/uL   RBC 5.52 (H) 3.87 - 5.11 MIL/uL   Hemoglobin 13.6 12.0 - 15.0 g/dL   HCT 41.9 36.0 - 46.0 %   MCV 75.9 (L) 80.0 - 100.0 fL   MCH 24.6 (L) 26.0 - 34.0 pg   MCHC 32.5 30.0 - 36.0 g/dL   RDW 18.7 (H) 11.5 - 15.5 %   Platelets 398 150 - 400 K/uL   nRBC 0.0 0.0 - 0.2 %   Neutrophils Relative % 87 %   Neutro Abs 9.9 (H) 1.7 - 7.7 K/uL   Lymphocytes Relative 5 %   Lymphs Abs 0.6 (L) 0.7 - 4.0 K/uL   Monocytes Relative 7 %   Monocytes Absolute 0.7 0.1 - 1.0 K/uL   Eosinophils Relative 0 %   Eosinophils Absolute 0.0 0.0 - 0.5 K/uL   Basophils Relative 0 %   Basophils Absolute 0.0 0.0 - 0.1 K/uL   Immature Granulocytes 1 %   Abs Immature Granulocytes 0.07 0.00 - 0.07 K/uL  Basic metabolic panel  Result Value Ref Range   Sodium 128 (L) 135 - 145 mmol/L   Potassium 5.1 3.5 - 5.1 mmol/L   Chloride 91 (L) 98 - 111 mmol/L   CO2 22 22 - 32 mmol/L   Glucose, Bld 227 (H) 70 - 99 mg/dL   BUN  54 (H) 8 - 23 mg/dL   Creatinine, Ser 1.94 (H) 0.44 - 1.00 mg/dL   Calcium 9.3 8.9 - 10.3 mg/dL   GFR, Estimated 27 (L) >60 mL/min   Anion gap 15 5 - 15  Iron and TIBC  Result  Value Ref Range   Iron 19 (L) 28 - 170 ug/dL   TIBC 484 (H) 250 - 450 ug/dL   Saturation Ratios 4 (L) 10.4 - 31.8 %   UIBC 465 ug/dL  Folate  Result Value Ref Range   Folate 8.3 >5.9 ng/mL  Vitamin B12  Result Value Ref Range   Vitamin B-12 284 180 - 914 pg/mL  VITAMIN D 25 Hydroxy (Vit-D Deficiency, Fractures)  Result Value Ref Range   Vit D, 25-Hydroxy 126.28 (H) 30 - 100 ng/mL  TSH  Result Value Ref Range   TSH 0.902 0.350 - 4.500 uIU/mL  Body fluid cell count with differential  Result Value Ref Range   Fluid Type-FCT CYTO PERI    Color, Fluid ORANGE (A) YELLOW   Appearance, Fluid TURBID (A) CLEAR   Total Nucleated Cell Count, Fluid 621 cu mm   Neutrophil Count, Fluid 16 %   Lymphs, Fluid 61 %   Monocyte-Macrophage-Serous Fluid 23 %   Eos, Fluid 0 %   Other Cells, Fluid 0 %  Albumin, pleural or peritoneal fluid   Result Value Ref Range   Albumin, Fluid 1.8 g/dL   Fluid Type-FALB CYTO PERI   Protein, pleural or peritoneal fluid  Result Value Ref Range   Total protein, fluid <3.0 g/dL   Fluid Type-FTP CYTO PERI   Glucose, capillary  Result Value Ref Range   Glucose-Capillary 328 (H) 70 - 99 mg/dL  Basic metabolic panel  Result Value Ref Range   Sodium 131 (L) 135 - 145 mmol/L   Potassium 4.8 3.5 - 5.1 mmol/L   Chloride 93 (L) 98 - 111 mmol/L   CO2 27 22 - 32 mmol/L   Glucose, Bld 253 (H) 70 - 99 mg/dL   BUN 57 (H) 8 - 23 mg/dL   Creatinine, Ser 1.84 (H) 0.44 - 1.00 mg/dL   Calcium 9.2 8.9 - 10.3 mg/dL   GFR, Estimated 28 (L) >60 mL/min   Anion gap 11 5 - 15  CBC  Result Value Ref Range   WBC 9.6 4.0 - 10.5 K/uL   RBC 5.34 (H) 3.87 - 5.11 MIL/uL   Hemoglobin 12.9 12.0 - 15.0 g/dL   HCT 40.2 36.0 - 46.0 %   MCV 75.3 (L) 80.0 - 100.0 fL   MCH 24.2 (L) 26.0 - 34.0 pg   MCHC 32.1  30.0 - 36.0 g/dL   RDW 18.5 (H) 11.5 - 15.5 %   Platelets 337 150 - 400 K/uL   nRBC 0.0 0.0 - 0.2 %  Magnesium  Result Value Ref Range   Magnesium 2.4 1.7 - 2.4 mg/dL  Phosphorus  Result Value Ref Range   Phosphorus 3.5 2.5 - 4.6 mg/dL  Protein, body fluid (other)  Result Value Ref Range   Total Protein, Body Fluid Other 2.9 g/dL   Source of Sample PERITONEAL   Glucose, capillary  Result Value Ref Range   Glucose-Capillary 290 (H) 70 - 99 mg/dL  Glucose, capillary  Result Value Ref Range   Glucose-Capillary 234 (H) 70 - 99 mg/dL  Glucose, capillary  Result Value Ref Range   Glucose-Capillary 312 (H) 70 - 99 mg/dL  Glucose, capillary  Result Value Ref Range   Glucose-Capillary 279 (H) 70 - 99 mg/dL  Basic metabolic panel  Result Value Ref Range   Sodium 135 135 - 145 mmol/L   Potassium 4.1 3.5 - 5.1 mmol/L   Chloride 95 (L) 98 - 111 mmol/L   CO2 29 22 - 32  mmol/L   Glucose, Bld 280 (H) 70 - 99 mg/dL   BUN 60 (H) 8 - 23 mg/dL   Creatinine, Ser 1.75 (H) 0.44 - 1.00 mg/dL   Calcium 9.2 8.9 - 10.3 mg/dL   GFR, Estimated 30 (L) >60 mL/min   Anion gap 11 5 - 15  CBC  Result Value Ref Range   WBC 8.1 4.0 - 10.5 K/uL   RBC 5.46 (H) 3.87 - 5.11 MIL/uL   Hemoglobin 13.3 12.0 - 15.0 g/dL   HCT 41.0 36.0 - 46.0 %   MCV 75.1 (L) 80.0 - 100.0 fL   MCH 24.4 (L) 26.0 - 34.0 pg   MCHC 32.4 30.0 - 36.0 g/dL   RDW 18.6 (H) 11.5 - 15.5 %   Platelets 332 150 - 400 K/uL   nRBC 0.0 0.0 - 0.2 %  Magnesium  Result Value Ref Range   Magnesium 2.7 (H) 1.7 - 2.4 mg/dL  Phosphorus  Result Value Ref Range   Phosphorus 3.3 2.5 - 4.6 mg/dL  Glucose, capillary  Result Value Ref Range   Glucose-Capillary 280 (H) 70 - 99 mg/dL  Glucose, capillary  Result Value Ref Range   Glucose-Capillary 253 (H) 70 - 99 mg/dL  Glucose, capillary  Result Value Ref Range   Glucose-Capillary 305 (H) 70 - 99 mg/dL  Glucose, capillary  Result Value Ref Range   Glucose-Capillary 274 (H) 70 - 99 mg/dL   Basic metabolic panel  Result Value Ref Range   Sodium 136 135 - 145 mmol/L   Potassium 3.8 3.5 - 5.1 mmol/L   Chloride 93 (L) 98 - 111 mmol/L   CO2 31 22 - 32 mmol/L   Glucose, Bld 214 (H) 70 - 99 mg/dL   BUN 62 (H) 8 - 23 mg/dL   Creatinine, Ser 1.75 (H) 0.44 - 1.00 mg/dL   Calcium 8.9 8.9 - 10.3 mg/dL   GFR, Estimated 30 (L) >60 mL/min   Anion gap 12 5 - 15  CBC  Result Value Ref Range   WBC 9.0 4.0 - 10.5 K/uL   RBC 5.13 (H) 3.87 - 5.11 MIL/uL   Hemoglobin 12.4 12.0 - 15.0 g/dL   HCT 38.4 36.0 - 46.0 %   MCV 74.9 (L) 80.0 - 100.0 fL   MCH 24.2 (L) 26.0 - 34.0 pg   MCHC 32.3 30.0 - 36.0 g/dL   RDW 18.6 (H) 11.5 - 15.5 %   Platelets 311 150 - 400 K/uL   nRBC 0.0 0.0 - 0.2 %  Magnesium  Result Value Ref Range   Magnesium 2.2 1.7 - 2.4 mg/dL  Phosphorus  Result Value Ref Range   Phosphorus 2.7 2.5 - 4.6 mg/dL  Glucose, capillary  Result Value Ref Range   Glucose-Capillary 308 (H) 70 - 99 mg/dL  Glucose, capillary  Result Value Ref Range   Glucose-Capillary 211 (H) 70 - 99 mg/dL  Glucose, capillary  Result Value Ref Range   Glucose-Capillary 230 (H) 70 - 99 mg/dL  CBC  Result Value Ref Range   WBC 10.9 (H) 4.0 - 10.5 K/uL   RBC 3.95 3.87 - 5.11 MIL/uL   Hemoglobin 9.7 (L) 12.0 - 15.0 g/dL   HCT 30.0 (L) 36.0 - 46.0 %   MCV 75.9 (L) 80.0 - 100.0 fL   MCH 24.6 (L) 26.0 - 34.0 pg   MCHC 32.3 30.0 - 36.0 g/dL   RDW 18.3 (H) 11.5 - 15.5 %   Platelets 288 150 - 400 K/uL   nRBC  0.4 (H) 0.0 - 0.2 %  Hemoglobin and hematocrit, blood  Result Value Ref Range   Hemoglobin 7.7 (L) 12.0 - 15.0 g/dL   HCT 24.0 (L) 36.0 - 46.0 %  Glucose, capillary  Result Value Ref Range   Glucose-Capillary 256 (H) 70 - 99 mg/dL  Basic metabolic panel  Result Value Ref Range   Sodium 140 135 - 145 mmol/L   Potassium 4.0 3.5 - 5.1 mmol/L   Chloride 100 98 - 111 mmol/L   CO2 30 22 - 32 mmol/L   Glucose, Bld 244 (H) 70 - 99 mg/dL   BUN 87 (H) 8 - 23 mg/dL   Creatinine, Ser 1.55 (H)  0.44 - 1.00 mg/dL   Calcium 8.9 8.9 - 10.3 mg/dL   GFR, Estimated 35 (L) >60 mL/min   Anion gap 10 5 - 15  Glucose, capillary  Result Value Ref Range   Glucose-Capillary 252 (H) 70 - 99 mg/dL  Glucose, capillary  Result Value Ref Range   Glucose-Capillary 212 (H) 70 - 99 mg/dL  Brain natriuretic peptide  Result Value Ref Range   B Natriuretic Peptide 184.5 (H) 0.0 - 100.0 pg/mL  CBC  Result Value Ref Range   WBC 13.1 (H) 4.0 - 10.5 K/uL   RBC 4.09 3.87 - 5.11 MIL/uL   Hemoglobin 11.2 (L) 12.0 - 15.0 g/dL   HCT 32.8 (L) 36.0 - 46.0 %   MCV 80.2 80.0 - 100.0 fL   MCH 27.4 26.0 - 34.0 pg   MCHC 34.1 30.0 - 36.0 g/dL   RDW 17.2 (H) 11.5 - 15.5 %   Platelets 244 150 - 400 K/uL   nRBC 2.3 (H) 0.0 - 0.2 %  Protime-INR  Result Value Ref Range   Prothrombin Time 17.2 (H) 11.4 - 15.2 seconds   INR 1.4 (H) 0.8 - 1.2  Magnesium  Result Value Ref Range   Magnesium 2.1 1.7 - 2.4 mg/dL  Phosphorus  Result Value Ref Range   Phosphorus 3.7 2.5 - 4.6 mg/dL  Hemoglobin and hematocrit, blood  Result Value Ref Range   Hemoglobin 10.3 (L) 12.0 - 15.0 g/dL   HCT 29.9 (L) 36.0 - 46.0 %  Hemoglobin and hematocrit, blood  Result Value Ref Range   Hemoglobin 10.3 (L) 12.0 - 15.0 g/dL   HCT 31.4 (L) 36.0 - 46.0 %  Hemoglobin and hematocrit, blood  Result Value Ref Range   Hemoglobin 9.9 (L) 12.0 - 15.0 g/dL   HCT 29.4 (L) 36.0 - 46.0 %  Glucose, capillary  Result Value Ref Range   Glucose-Capillary 222 (H) 70 - 99 mg/dL  ESR  Result Value Ref Range   Sed Rate 2 0 - 30 mm/hr  C-reactive protein  Result Value Ref Range   CRP 0.7 <1.0 mg/dL  ANA w/Reflex if Positive  Result Value Ref Range   Anti Nuclear Antibody (ANA) Negative Negative  Glucose, capillary  Result Value Ref Range   Glucose-Capillary 177 (H) 70 - 99 mg/dL  Glucose, capillary  Result Value Ref Range   Glucose-Capillary 184 (H) 70 - 99 mg/dL  Body fluid cell count with differential  Result Value Ref Range   Fluid  Type-FCT PERICARDIAL    Color, Fluid RED (A) YELLOW   Appearance, Fluid CLOUDY (A) CLEAR   Total Nucleated Cell Count, Fluid 1,520 cu mm   Neutrophil Count, Fluid 9 %   Lymphs, Fluid 55 %   Monocyte-Macrophage-Serous Fluid 30 %   Eos, Fluid 0 %  Other Cells, Fluid 6 %  Comprehensive metabolic panel  Result Value Ref Range   Sodium 143 135 - 145 mmol/L   Potassium 3.4 (L) 3.5 - 5.1 mmol/L   Chloride 104 98 - 111 mmol/L   CO2 28 22 - 32 mmol/L   Glucose, Bld 148 (H) 70 - 99 mg/dL   BUN 67 (H) 8 - 23 mg/dL   Creatinine, Ser 1.43 (H) 0.44 - 1.00 mg/dL   Calcium 9.3 8.9 - 10.3 mg/dL   Total Protein 5.3 (L) 6.5 - 8.1 g/dL   Albumin 3.6 3.5 - 5.0 g/dL   AST 21 15 - 41 U/L   ALT 15 0 - 44 U/L   Alkaline Phosphatase 40 38 - 126 U/L   Total Bilirubin 2.6 (H) 0.3 - 1.2 mg/dL   GFR, Estimated 38 (L) >60 mL/min   Anion gap 11 5 - 15  CBC with Differential/Platelet  Result Value Ref Range   WBC 14.6 (H) 4.0 - 10.5 K/uL   RBC 3.70 (L) 3.87 - 5.11 MIL/uL   Hemoglobin 10.3 (L) 12.0 - 15.0 g/dL   HCT 31.9 (L) 36.0 - 46.0 %   MCV 86.2 80.0 - 100.0 fL   MCH 27.8 26.0 - 34.0 pg   MCHC 32.3 30.0 - 36.0 g/dL   RDW 19.8 (H) 11.5 - 15.5 %   Platelets 227 150 - 400 K/uL   nRBC 1.7 (H) 0.0 - 0.2 %   Neutrophils Relative % 74 %   Neutro Abs 10.8 (H) 1.7 - 7.7 K/uL   Lymphocytes Relative 9 %   Lymphs Abs 1.3 0.7 - 4.0 K/uL   Monocytes Relative 12 %   Monocytes Absolute 1.7 (H) 0.1 - 1.0 K/uL   Eosinophils Relative 3 %   Eosinophils Absolute 0.5 0.0 - 0.5 K/uL   Basophils Relative 0 %   Basophils Absolute 0.0 0.0 - 0.1 K/uL   Immature Granulocytes 2 %   Abs Immature Granulocytes 0.25 (H) 0.00 - 0.07 K/uL  Magnesium  Result Value Ref Range   Magnesium 2.2 1.7 - 2.4 mg/dL  Phosphorus  Result Value Ref Range   Phosphorus 3.5 2.5 - 4.6 mg/dL  Pathologist smear review  Result Value Ref Range   Path Review Cytospin of pericardial fluid is reviewed.   Glucose, capillary  Result Value Ref  Range   Glucose-Capillary 157 (H) 70 - 99 mg/dL  Glucose, capillary  Result Value Ref Range   Glucose-Capillary 131 (H) 70 - 99 mg/dL  Glucose, capillary  Result Value Ref Range   Glucose-Capillary 149 (H) 70 - 99 mg/dL  Glucose, capillary  Result Value Ref Range   Glucose-Capillary 217 (H) 70 - 99 mg/dL   Comment 1 Notify RN    Comment 2 Document in Chart   Glucose, capillary  Result Value Ref Range   Glucose-Capillary 207 (H) 70 - 99 mg/dL  Basic metabolic panel  Result Value Ref Range   Sodium 144 135 - 145 mmol/L   Potassium 3.4 (L) 3.5 - 5.1 mmol/L   Chloride 104 98 - 111 mmol/L   CO2 33 (H) 22 - 32 mmol/L   Glucose, Bld 147 (H) 70 - 99 mg/dL   BUN 51 (H) 8 - 23 mg/dL   Creatinine, Ser 1.30 (H) 0.44 - 1.00 mg/dL   Calcium 9.2 8.9 - 10.3 mg/dL   GFR, Estimated 43 (L) >60 mL/min   Anion gap 7 5 - 15  CBC  Result Value Ref Range   WBC  13.4 (H) 4.0 - 10.5 K/uL   RBC 3.36 (L) 3.87 - 5.11 MIL/uL   Hemoglobin 9.4 (L) 12.0 - 15.0 g/dL   HCT 29.1 (L) 36.0 - 46.0 %   MCV 86.6 80.0 - 100.0 fL   MCH 28.0 26.0 - 34.0 pg   MCHC 32.3 30.0 - 36.0 g/dL   RDW 21.2 (H) 11.5 - 15.5 %   Platelets 205 150 - 400 K/uL   nRBC 1.9 (H) 0.0 - 0.2 %  Magnesium  Result Value Ref Range   Magnesium 2.1 1.7 - 2.4 mg/dL  Phosphorus  Result Value Ref Range   Phosphorus 3.1 2.5 - 4.6 mg/dL  Glucose, capillary  Result Value Ref Range   Glucose-Capillary 161 (H) 70 - 99 mg/dL  Hemoglobin and hematocrit, blood  Result Value Ref Range   Hemoglobin 9.3 (L) 12.0 - 15.0 g/dL   HCT 29.5 (L) 36.0 - 46.0 %  Glucose, capillary  Result Value Ref Range   Glucose-Capillary 157 (H) 70 - 99 mg/dL  Glucose, capillary  Result Value Ref Range   Glucose-Capillary 176 (H) 70 - 99 mg/dL  Glucose, capillary  Result Value Ref Range   Glucose-Capillary 184 (H) 70 - 99 mg/dL  CBC  Result Value Ref Range   WBC 10.5 4.0 - 10.5 K/uL   RBC 3.42 (L) 3.87 - 5.11 MIL/uL   Hemoglobin 9.3 (L) 12.0 - 15.0 g/dL   HCT  30.0 (L) 36.0 - 46.0 %   MCV 87.7 80.0 - 100.0 fL   MCH 27.2 26.0 - 34.0 pg   MCHC 31.0 30.0 - 36.0 g/dL   RDW 23.4 (H) 11.5 - 15.5 %   Platelets 192 150 - 400 K/uL   nRBC 0.6 (H) 0.0 - 0.2 %  Basic metabolic panel  Result Value Ref Range   Sodium 141 135 - 145 mmol/L   Potassium 3.6 3.5 - 5.1 mmol/L   Chloride 102 98 - 111 mmol/L   CO2 30 22 - 32 mmol/L   Glucose, Bld 158 (H) 70 - 99 mg/dL   BUN 37 (H) 8 - 23 mg/dL   Creatinine, Ser 1.33 (H) 0.44 - 1.00 mg/dL   Calcium 8.9 8.9 - 10.3 mg/dL   GFR, Estimated 42 (L) >60 mL/min   Anion gap 9 5 - 15  Glucose, capillary  Result Value Ref Range   Glucose-Capillary 160 (H) 70 - 99 mg/dL  Glucose, capillary  Result Value Ref Range   Glucose-Capillary 164 (H) 70 - 99 mg/dL  Glucose, capillary  Result Value Ref Range   Glucose-Capillary 185 (H) 70 - 99 mg/dL  Glucose, capillary  Result Value Ref Range   Glucose-Capillary 187 (H) 70 - 99 mg/dL  Glucose, capillary  Result Value Ref Range   Glucose-Capillary 161 (H) 70 - 99 mg/dL  CBC with Differential/Platelet  Result Value Ref Range   WBC 8.2 4.0 - 10.5 K/uL   RBC 3.50 (L) 3.87 - 5.11 MIL/uL   Hemoglobin 9.7 (L) 12.0 - 15.0 g/dL   HCT 30.9 (L) 36.0 - 46.0 %   MCV 88.3 80.0 - 100.0 fL   MCH 27.7 26.0 - 34.0 pg   MCHC 31.4 30.0 - 36.0 g/dL   RDW 24.9 (H) 11.5 - 15.5 %   Platelets 186 150 - 400 K/uL   nRBC 0.2 0.0 - 0.2 %   Neutrophils Relative % 74 %   Neutro Abs 6.2 1.7 - 7.7 K/uL   Lymphocytes Relative 10 %   Lymphs Abs  0.8 0.7 - 4.0 K/uL   Monocytes Relative 10 %   Monocytes Absolute 0.8 0.1 - 1.0 K/uL   Eosinophils Relative 4 %   Eosinophils Absolute 0.4 0.0 - 0.5 K/uL   Basophils Relative 0 %   Basophils Absolute 0.0 0.0 - 0.1 K/uL   WBC Morphology MORPHOLOGY UNREMARKABLE    Smear Review Normal platelet morphology    Immature Granulocytes 2 %   Abs Immature Granulocytes 0.13 (H) 0.00 - 0.07 K/uL   Polychromasia PRESENT    Ovalocytes PRESENT   Basic metabolic  panel  Result Value Ref Range   Sodium 141 135 - 145 mmol/L   Potassium 3.3 (L) 3.5 - 5.1 mmol/L   Chloride 101 98 - 111 mmol/L   CO2 34 (H) 22 - 32 mmol/L   Glucose, Bld 146 (H) 70 - 99 mg/dL   BUN 35 (H) 8 - 23 mg/dL   Creatinine, Ser 1.39 (H) 0.44 - 1.00 mg/dL   Calcium 8.9 8.9 - 10.3 mg/dL   GFR, Estimated 40 (L) >60 mL/min   Anion gap 6 5 - 15  Magnesium  Result Value Ref Range   Magnesium 2.0 1.7 - 2.4 mg/dL  Phosphorus  Result Value Ref Range   Phosphorus 2.3 (L) 2.5 - 4.6 mg/dL  Glucose, capillary  Result Value Ref Range   Glucose-Capillary 200 (H) 70 - 99 mg/dL  Glucose, capillary  Result Value Ref Range   Glucose-Capillary 130 (H) 70 - 99 mg/dL  Glucose, capillary  Result Value Ref Range   Glucose-Capillary 177 (H) 70 - 99 mg/dL  Glucose, capillary  Result Value Ref Range   Glucose-Capillary 189 (H) 70 - 99 mg/dL  CBC with Differential/Platelet  Result Value Ref Range   WBC 8.1 4.0 - 10.5 K/uL   RBC 3.39 (L) 3.87 - 5.11 MIL/uL   Hemoglobin 9.7 (L) 12.0 - 15.0 g/dL   HCT 30.4 (L) 36.0 - 46.0 %   MCV 89.7 80.0 - 100.0 fL   MCH 28.6 26.0 - 34.0 pg   MCHC 31.9 30.0 - 36.0 g/dL   RDW 25.8 (H) 11.5 - 15.5 %   Platelets 196 150 - 400 K/uL   nRBC 0.4 (H) 0.0 - 0.2 %   Neutrophils Relative % 77 %   Neutro Abs 6.2 1.7 - 7.7 K/uL   Lymphocytes Relative 7 %   Lymphs Abs 0.6 (L) 0.7 - 4.0 K/uL   Monocytes Relative 10 %   Monocytes Absolute 0.8 0.1 - 1.0 K/uL   Eosinophils Relative 5 %   Eosinophils Absolute 0.4 0.0 - 0.5 K/uL   Basophils Relative 0 %   Basophils Absolute 0.0 0.0 - 0.1 K/uL   WBC Morphology MORPHOLOGY UNREMARKABLE    RBC Morphology See Note    Smear Review Normal platelet morphology    Immature Granulocytes 1 %   Abs Immature Granulocytes 0.10 (H) 0.00 - 0.07 K/uL  Basic metabolic panel  Result Value Ref Range   Sodium 144 135 - 145 mmol/L   Potassium 3.3 (L) 3.5 - 5.1 mmol/L   Chloride 101 98 - 111 mmol/L   CO2 32 22 - 32 mmol/L   Glucose,  Bld 151 (H) 70 - 99 mg/dL   BUN 32 (H) 8 - 23 mg/dL   Creatinine, Ser 1.47 (H) 0.44 - 1.00 mg/dL   Calcium 8.7 (L) 8.9 - 10.3 mg/dL   GFR, Estimated 37 (L) >60 mL/min   Anion gap 11 5 - 15  Magnesium  Result Value  Ref Range   Magnesium 1.9 1.7 - 2.4 mg/dL  Phosphorus  Result Value Ref Range   Phosphorus 2.0 (L) 2.5 - 4.6 mg/dL  Glucose, capillary  Result Value Ref Range   Glucose-Capillary 180 (H) 70 - 99 mg/dL  Glucose, capillary  Result Value Ref Range   Glucose-Capillary 143 (H) 70 - 99 mg/dL  Glucose, capillary  Result Value Ref Range   Glucose-Capillary 209 (H) 70 - 99 mg/dL  APTT  Result Value Ref Range   aPTT 200 (HH) 24 - 36 seconds  Protime-INR  Result Value Ref Range   Prothrombin Time 17.5 (H) 11.4 - 15.2 seconds   INR 1.4 (H) 0.8 - 1.2  Heparin level (unfractionated)  Result Value Ref Range   Heparin Unfractionated 0.37 0.30 - 0.70 IU/mL  APTT  Result Value Ref Range   aPTT 93 (H) 24 - 36 seconds  APTT  Result Value Ref Range   aPTT 83 (H) 24 - 36 seconds  Glucose, capillary  Result Value Ref Range   Glucose-Capillary 214 (H) 70 - 99 mg/dL  CBC with Differential/Platelet  Result Value Ref Range   WBC 9.0 4.0 - 10.5 K/uL   RBC 3.43 (L) 3.87 - 5.11 MIL/uL   Hemoglobin 9.9 (L) 12.0 - 15.0 g/dL   HCT 31.3 (L) 36.0 - 46.0 %   MCV 91.3 80.0 - 100.0 fL   MCH 28.9 26.0 - 34.0 pg   MCHC 31.6 30.0 - 36.0 g/dL   RDW 26.1 (H) 11.5 - 15.5 %   Platelets 214 150 - 400 K/uL   nRBC 0.3 (H) 0.0 - 0.2 %   Neutrophils Relative % 75 %   Neutro Abs 6.8 1.7 - 7.7 K/uL   Lymphocytes Relative 10 %   Lymphs Abs 0.9 0.7 - 4.0 K/uL   Monocytes Relative 9 %   Monocytes Absolute 0.8 0.1 - 1.0 K/uL   Eosinophils Relative 4 %   Eosinophils Absolute 0.4 0.0 - 0.5 K/uL   Basophils Relative 1 %   Basophils Absolute 0.1 0.0 - 0.1 K/uL   WBC Morphology MORPHOLOGY UNREMARKABLE    RBC Morphology MIXED RBC POPULATION    Smear Review Normal platelet morphology    Immature  Granulocytes 1 %   Abs Immature Granulocytes 0.10 (H) 0.00 - 0.07 K/uL   Polychromasia PRESENT   Basic metabolic panel  Result Value Ref Range   Sodium 142 135 - 145 mmol/L   Potassium 3.3 (L) 3.5 - 5.1 mmol/L   Chloride 100 98 - 111 mmol/L   CO2 35 (H) 22 - 32 mmol/L   Glucose, Bld 155 (H) 70 - 99 mg/dL   BUN 34 (H) 8 - 23 mg/dL   Creatinine, Ser 1.34 (H) 0.44 - 1.00 mg/dL   Calcium 8.8 (L) 8.9 - 10.3 mg/dL   GFR, Estimated 41 (L) >60 mL/min   Anion gap 7 5 - 15  Magnesium  Result Value Ref Range   Magnesium 2.0 1.7 - 2.4 mg/dL  Phosphorus  Result Value Ref Range   Phosphorus 2.2 (L) 2.5 - 4.6 mg/dL  Heparin level (unfractionated)  Result Value Ref Range   Heparin Unfractionated 0.42 0.30 - 0.70 IU/mL  Glucose, capillary  Result Value Ref Range   Glucose-Capillary 213 (H) 70 - 99 mg/dL  Glucose, capillary  Result Value Ref Range   Glucose-Capillary 135 (H) 70 - 99 mg/dL  Glucose, capillary  Result Value Ref Range   Glucose-Capillary 178 (H) 70 - 99 mg/dL  Glucose,  capillary  Result Value Ref Range   Glucose-Capillary 159 (H) 70 - 99 mg/dL  Magnesium  Result Value Ref Range   Magnesium 2.0 1.7 - 2.4 mg/dL  Phosphorus  Result Value Ref Range   Phosphorus 2.4 (L) 2.5 - 4.6 mg/dL  Heparin level (unfractionated)  Result Value Ref Range   Heparin Unfractionated 0.41 0.30 - 0.70 IU/mL  CBC  Result Value Ref Range   WBC 8.2 4.0 - 10.5 K/uL   RBC 3.39 (L) 3.87 - 5.11 MIL/uL   Hemoglobin 9.6 (L) 12.0 - 15.0 g/dL   HCT 30.8 (L) 36.0 - 46.0 %   MCV 90.9 80.0 - 100.0 fL   MCH 28.3 26.0 - 34.0 pg   MCHC 31.2 30.0 - 36.0 g/dL   RDW 26.1 (H) 11.5 - 15.5 %   Platelets 229 150 - 400 K/uL   nRBC 0.0 0.0 - 0.2 %  Glucose, capillary  Result Value Ref Range   Glucose-Capillary 150 (H) 70 - 99 mg/dL  Glucose, capillary  Result Value Ref Range   Glucose-Capillary 133 (H) 70 - 99 mg/dL  Glucose, capillary  Result Value Ref Range   Glucose-Capillary 131 (H) 70 - 99 mg/dL   Basic metabolic panel  Result Value Ref Range   Sodium 141 135 - 145 mmol/L   Potassium 4.7 3.5 - 5.1 mmol/L   Chloride 99 98 - 111 mmol/L   CO2 27 22 - 32 mmol/L   Glucose, Bld 155 (H) 70 - 99 mg/dL   BUN 34 (H) 8 - 23 mg/dL   Creatinine, Ser 1.43 (H) 0.44 - 1.00 mg/dL   Calcium 8.9 8.9 - 10.3 mg/dL   GFR, Estimated 38 (L) >60 mL/min   Anion gap 15 5 - 15  Glucose, capillary  Result Value Ref Range   Glucose-Capillary 153 (H) 70 - 99 mg/dL  Magnesium  Result Value Ref Range   Magnesium 2.1 1.7 - 2.4 mg/dL  Phosphorus  Result Value Ref Range   Phosphorus 2.3 (L) 2.5 - 4.6 mg/dL  CBC  Result Value Ref Range   WBC 8.0 4.0 - 10.5 K/uL   RBC 3.59 (L) 3.87 - 5.11 MIL/uL   Hemoglobin 9.8 (L) 12.0 - 15.0 g/dL   HCT 32.3 (L) 36.0 - 46.0 %   MCV 90.0 80.0 - 100.0 fL   MCH 27.3 26.0 - 34.0 pg   MCHC 30.3 30.0 - 36.0 g/dL   RDW 26.2 (H) 11.5 - 15.5 %   Platelets 264 150 - 400 K/uL   nRBC 0.0 0.0 - 0.2 %  Glucose, capillary  Result Value Ref Range   Glucose-Capillary 118 (H) 70 - 99 mg/dL  Glucose, capillary  Result Value Ref Range   Glucose-Capillary 149 (H) 70 - 99 mg/dL  Renal function panel  Result Value Ref Range   Sodium 138 135 - 145 mmol/L   Potassium 4.2 3.5 - 5.1 mmol/L   Chloride 99 98 - 111 mmol/L   CO2 27 22 - 32 mmol/L   Glucose, Bld 140 (H) 70 - 99 mg/dL   BUN 34 (H) 8 - 23 mg/dL   Creatinine, Ser 1.51 (H) 0.44 - 1.00 mg/dL   Calcium 9.2 8.9 - 10.3 mg/dL   Phosphorus 2.3 (L) 2.5 - 4.6 mg/dL   Albumin 3.9 3.5 - 5.0 g/dL   GFR, Estimated 36 (L) >60 mL/min   Anion gap 12 5 - 15  Glucose, capillary  Result Value Ref Range   Glucose-Capillary 145 (H) 70 -  99 mg/dL  Magnesium  Result Value Ref Range   Magnesium 2.2 1.7 - 2.4 mg/dL  Phosphorus  Result Value Ref Range   Phosphorus 2.6 2.5 - 4.6 mg/dL  Glucose, capillary  Result Value Ref Range   Glucose-Capillary 140 (H) 70 - 99 mg/dL  Glucose, capillary  Result Value Ref Range   Glucose-Capillary 244  (H) 70 - 99 mg/dL  Glucose, capillary  Result Value Ref Range   Glucose-Capillary 138 (H) 70 - 99 mg/dL  Basic metabolic panel  Result Value Ref Range   Sodium 139 135 - 145 mmol/L   Potassium 4.0 3.5 - 5.1 mmol/L   Chloride 99 98 - 111 mmol/L   CO2 27 22 - 32 mmol/L   Glucose, Bld 145 (H) 70 - 99 mg/dL   BUN 35 (H) 8 - 23 mg/dL   Creatinine, Ser 1.55 (H) 0.44 - 1.00 mg/dL   Calcium 9.3 8.9 - 10.3 mg/dL   GFR, Estimated 35 (L) >60 mL/min   Anion gap 13 5 - 15  CBC  Result Value Ref Range   WBC 8.6 4.0 - 10.5 K/uL   RBC 3.54 (L) 3.87 - 5.11 MIL/uL   Hemoglobin 9.8 (L) 12.0 - 15.0 g/dL   HCT 32.4 (L) 36.0 - 46.0 %   MCV 91.5 80.0 - 100.0 fL   MCH 27.7 26.0 - 34.0 pg   MCHC 30.2 30.0 - 36.0 g/dL   RDW 26.8 (H) 11.5 - 15.5 %   Platelets 272 150 - 400 K/uL   nRBC 0.0 0.0 - 0.2 %  Glucose, capillary  Result Value Ref Range   Glucose-Capillary 190 (H) 70 - 99 mg/dL  Glucose, capillary  Result Value Ref Range   Glucose-Capillary 160 (H) 70 - 99 mg/dL  Magnesium  Result Value Ref Range   Magnesium 2.2 1.7 - 2.4 mg/dL  Phosphorus  Result Value Ref Range   Phosphorus 2.7 2.5 - 4.6 mg/dL  Glucose, capillary  Result Value Ref Range   Glucose-Capillary 138 (H) 70 - 99 mg/dL  Glucose, capillary  Result Value Ref Range   Glucose-Capillary 118 (H) 70 - 99 mg/dL  Basic metabolic panel  Result Value Ref Range   Sodium 135 135 - 145 mmol/L   Potassium 3.3 (L) 3.5 - 5.1 mmol/L   Chloride 96 (L) 98 - 111 mmol/L   CO2 30 22 - 32 mmol/L   Glucose, Bld 145 (H) 70 - 99 mg/dL   BUN 33 (H) 8 - 23 mg/dL   Creatinine, Ser 1.79 (H) 0.44 - 1.00 mg/dL   Calcium 9.0 8.9 - 10.3 mg/dL   GFR, Estimated 29 (L) >60 mL/min   Anion gap 9 5 - 15  Comprehensive metabolic panel  Result Value Ref Range   Sodium 135 135 - 145 mmol/L   Potassium 3.1 (L) 3.5 - 5.1 mmol/L   Chloride 95 (L) 98 - 111 mmol/L   CO2 28 22 - 32 mmol/L   Glucose, Bld 159 (H) 70 - 99 mg/dL   BUN 31 (H) 8 - 23 mg/dL    Creatinine, Ser 1.72 (H) 0.44 - 1.00 mg/dL   Calcium 9.2 8.9 - 10.3 mg/dL   Total Protein 6.5 6.5 - 8.1 g/dL   Albumin 4.0 3.5 - 5.0 g/dL   AST 27 15 - 41 U/L   ALT 17 0 - 44 U/L   Alkaline Phosphatase 73 38 - 126 U/L   Total Bilirubin 2.7 (H) 0.3 - 1.2 mg/dL   GFR, Estimated  31 (L) >60 mL/min   Anion gap 12 5 - 15  CBC  Result Value Ref Range   WBC 6.6 4.0 - 10.5 K/uL   RBC 3.70 (L) 3.87 - 5.11 MIL/uL   Hemoglobin 10.4 (L) 12.0 - 15.0 g/dL   HCT 33.1 (L) 36.0 - 46.0 %   MCV 89.5 80.0 - 100.0 fL   MCH 28.1 26.0 - 34.0 pg   MCHC 31.4 30.0 - 36.0 g/dL   RDW 26.4 (H) 11.5 - 15.5 %   Platelets 287 150 - 400 K/uL   nRBC 0.0 0.0 - 0.2 %  Glucose, capillary  Result Value Ref Range   Glucose-Capillary 151 (H) 70 - 99 mg/dL  Magnesium  Result Value Ref Range   Magnesium 2.1 1.7 - 2.4 mg/dL  Glucose, capillary  Result Value Ref Range   Glucose-Capillary 174 (H) 70 - 99 mg/dL  Magnesium  Result Value Ref Range   Magnesium 2.0 1.7 - 2.4 mg/dL  Phosphorus  Result Value Ref Range   Phosphorus 3.3 2.5 - 4.6 mg/dL  Comprehensive metabolic panel  Result Value Ref Range   Sodium 134 (L) 135 - 145 mmol/L   Potassium 3.4 (L) 3.5 - 5.1 mmol/L   Chloride 95 (L) 98 - 111 mmol/L   CO2 28 22 - 32 mmol/L   Glucose, Bld 206 (H) 70 - 99 mg/dL   BUN 40 (H) 8 - 23 mg/dL   Creatinine, Ser 2.01 (H) 0.44 - 1.00 mg/dL   Calcium 9.1 8.9 - 10.3 mg/dL   Total Protein 5.9 (L) 6.5 - 8.1 g/dL   Albumin 3.8 3.5 - 5.0 g/dL   AST 28 15 - 41 U/L   ALT 18 0 - 44 U/L   Alkaline Phosphatase 70 38 - 126 U/L   Total Bilirubin 2.0 (H) 0.3 - 1.2 mg/dL   GFR, Estimated 25 (L) >60 mL/min   Anion gap 11 5 - 15  CBC  Result Value Ref Range   WBC 6.7 4.0 - 10.5 K/uL   RBC 3.56 (L) 3.87 - 5.11 MIL/uL   Hemoglobin 10.0 (L) 12.0 - 15.0 g/dL   HCT 32.3 (L) 36.0 - 46.0 %   MCV 90.7 80.0 - 100.0 fL   MCH 28.1 26.0 - 34.0 pg   MCHC 31.0 30.0 - 36.0 g/dL   RDW 26.5 (H) 11.5 - 15.5 %   Platelets 298 150 - 400 K/uL    nRBC 0.0 0.0 - 0.2 %  Glucose, capillary  Result Value Ref Range   Glucose-Capillary 158 (H) 70 - 99 mg/dL  Glucose, capillary  Result Value Ref Range   Glucose-Capillary 133 (H) 70 - 99 mg/dL  Lactate dehydrogenase (pleural or peritoneal fluid)  Result Value Ref Range   LD, Fluid 46 (H) 3 - 23 U/L   Fluid Type-FLDH PLEURAL   Body fluid cell count with differential  Result Value Ref Range   Fluid Type-FCT PLEURAL    Color, Fluid YELLOW (A) YELLOW   Appearance, Fluid CLEAR CLEAR   Total Nucleated Cell Count, Fluid 717 cu mm   Neutrophil Count, Fluid 45 %   Lymphs, Fluid 50 %   Monocyte-Macrophage-Serous Fluid 5 %   Eos, Fluid 0 %  Amylase, pleural or peritoneal fluid     Result Value Ref Range   Amylase, Fluid 23 U/L   Fluid Type-FAMY PLEURAL   Albumin, pleural or peritoneal fluid   Result Value Ref Range   Albumin, Fluid <1.5 g/dL   Fluid Type-FALB  PLEURAL   Protein, pleural or peritoneal fluid  Result Value Ref Range   Total protein, fluid <3.0 g/dL   Fluid Type-FTP PLEURAL   Glucose, pleural or peritoneal fluid  Result Value Ref Range   Glucose, Fluid 148 mg/dL   Fluid Type-FGLU PLEURAL   Glucose, capillary  Result Value Ref Range   Glucose-Capillary 117 (H) 70 - 99 mg/dL  Protein, body fluid (other)  Result Value Ref Range   Total Protein, Body Fluid Other 1.9 g/dL   Source of Sample <3.0   Glucose, capillary  Result Value Ref Range   Glucose-Capillary 112 (H) 70 - 99 mg/dL  Magnesium  Result Value Ref Range   Magnesium 1.8 1.7 - 2.4 mg/dL  Phosphorus  Result Value Ref Range   Phosphorus 2.9 2.5 - 4.6 mg/dL  Comprehensive metabolic panel  Result Value Ref Range   Sodium 135 135 - 145 mmol/L   Potassium 2.8 (L) 3.5 - 5.1 mmol/L   Chloride 90 (L) 98 - 111 mmol/L   CO2 31 22 - 32 mmol/L   Glucose, Bld 206 (H) 70 - 99 mg/dL   BUN 40 (H) 8 - 23 mg/dL   Creatinine, Ser 2.06 (H) 0.44 - 1.00 mg/dL   Calcium 9.5 8.9 - 10.3 mg/dL   Total Protein 6.3 (L) 6.5  - 8.1 g/dL   Albumin 3.8 3.5 - 5.0 g/dL   AST 34 15 - 41 U/L   ALT 21 0 - 44 U/L   Alkaline Phosphatase 77 38 - 126 U/L   Total Bilirubin 2.4 (H) 0.3 - 1.2 mg/dL   GFR, Estimated 25 (L) >60 mL/min   Anion gap 14 5 - 15  CBC  Result Value Ref Range   WBC 6.2 4.0 - 10.5 K/uL   RBC 3.53 (L) 3.87 - 5.11 MIL/uL   Hemoglobin 10.1 (L) 12.0 - 15.0 g/dL   HCT 31.1 (L) 36.0 - 46.0 %   MCV 88.1 80.0 - 100.0 fL   MCH 28.6 26.0 - 34.0 pg   MCHC 32.5 30.0 - 36.0 g/dL   RDW 26.4 (H) 11.5 - 15.5 %   Platelets 369 150 - 400 K/uL   nRBC 0.0 0.0 - 0.2 %  Glucose, capillary  Result Value Ref Range   Glucose-Capillary 151 (H) 70 - 99 mg/dL  Glucose, capillary  Result Value Ref Range   Glucose-Capillary 135 (H) 70 - 99 mg/dL  Glucose, capillary  Result Value Ref Range   Glucose-Capillary 212 (H) 70 - 99 mg/dL  Glucose, capillary  Result Value Ref Range   Glucose-Capillary 158 (H) 70 - 99 mg/dL  Magnesium  Result Value Ref Range   Magnesium 2.7 (H) 1.7 - 2.4 mg/dL  Phosphorus  Result Value Ref Range   Phosphorus 3.4 2.5 - 4.6 mg/dL  Comprehensive metabolic panel  Result Value Ref Range   Sodium 137 135 - 145 mmol/L   Potassium 3.1 (L) 3.5 - 5.1 mmol/L   Chloride 93 (L) 98 - 111 mmol/L   CO2 34 (H) 22 - 32 mmol/L   Glucose, Bld 161 (H) 70 - 99 mg/dL   BUN 44 (H) 8 - 23 mg/dL   Creatinine, Ser 1.85 (H) 0.44 - 1.00 mg/dL   Calcium 9.4 8.9 - 10.3 mg/dL   Total Protein 6.1 (L) 6.5 - 8.1 g/dL   Albumin 3.8 3.5 - 5.0 g/dL   AST 26 15 - 41 U/L   ALT 19 0 - 44 U/L   Alkaline Phosphatase 74 38 -  126 U/L   Total Bilirubin 2.2 (H) 0.3 - 1.2 mg/dL   GFR, Estimated 28 (L) >60 mL/min   Anion gap 10 5 - 15  CBC  Result Value Ref Range   WBC 5.6 4.0 - 10.5 K/uL   RBC 3.44 (L) 3.87 - 5.11 MIL/uL   Hemoglobin 9.7 (L) 12.0 - 15.0 g/dL   HCT 30.6 (L) 36.0 - 46.0 %   MCV 89.0 80.0 - 100.0 fL   MCH 28.2 26.0 - 34.0 pg   MCHC 31.7 30.0 - 36.0 g/dL   RDW 26.5 (H) 11.5 - 15.5 %   Platelets 341 150 -  400 K/uL   nRBC 0.0 0.0 - 0.2 %  Vitamin B12  Result Value Ref Range   Vitamin B-12 977 (H) 180 - 914 pg/mL  Folate  Result Value Ref Range   Folate 28.0 >5.9 ng/mL  Iron and TIBC  Result Value Ref Range   Iron 35 28 - 170 ug/dL   TIBC 346 250 - 450 ug/dL   Saturation Ratios 10 (L) 10.4 - 31.8 %   UIBC 311 ug/dL  Ferritin  Result Value Ref Range   Ferritin 149 11 - 307 ng/mL  Reticulocytes  Result Value Ref Range   Retic Ct Pct 5.3 (H) 0.4 - 3.1 %   RBC. 3.52 (L) 3.87 - 5.11 MIL/uL   Retic Count, Absolute 186.6 (H) 19.0 - 186.0 K/uL   Immature Retic Fract 26.0 (H) 2.3 - 15.9 %  Glucose, capillary  Result Value Ref Range   Glucose-Capillary 188 (H) 70 - 99 mg/dL  Glucose, capillary  Result Value Ref Range   Glucose-Capillary 154 (H) 70 - 99 mg/dL  Glucose, capillary  Result Value Ref Range   Glucose-Capillary 211 (H) 70 - 99 mg/dL  Glucose, capillary  Result Value Ref Range   Glucose-Capillary 232 (H) 70 - 99 mg/dL  Magnesium  Result Value Ref Range   Magnesium 2.3 1.7 - 2.4 mg/dL  Phosphorus  Result Value Ref Range   Phosphorus 3.0 2.5 - 4.6 mg/dL  Comprehensive metabolic panel  Result Value Ref Range   Sodium 139 135 - 145 mmol/L   Potassium 2.9 (L) 3.5 - 5.1 mmol/L   Chloride 93 (L) 98 - 111 mmol/L   CO2 36 (H) 22 - 32 mmol/L   Glucose, Bld 155 (H) 70 - 99 mg/dL   BUN 41 (H) 8 - 23 mg/dL   Creatinine, Ser 1.94 (H) 0.44 - 1.00 mg/dL   Calcium 9.2 8.9 - 10.3 mg/dL   Total Protein 6.2 (L) 6.5 - 8.1 g/dL   Albumin 3.8 3.5 - 5.0 g/dL   AST 22 15 - 41 U/L   ALT 16 0 - 44 U/L   Alkaline Phosphatase 71 38 - 126 U/L   Total Bilirubin 2.5 (H) 0.3 - 1.2 mg/dL   GFR, Estimated 27 (L) >60 mL/min   Anion gap 10 5 - 15  CBC  Result Value Ref Range   WBC 6.2 4.0 - 10.5 K/uL   RBC 3.33 (L) 3.87 - 5.11 MIL/uL   Hemoglobin 9.6 (L) 12.0 - 15.0 g/dL   HCT 29.6 (L) 36.0 - 46.0 %   MCV 88.9 80.0 - 100.0 fL   MCH 28.8 26.0 - 34.0 pg   MCHC 32.4 30.0 - 36.0 g/dL   RDW  26.3 (H) 11.5 - 15.5 %   Platelets 351 150 - 400 K/uL   nRBC 0.0 0.0 - 0.2 %  Glucose, capillary  Result  Value Ref Range   Glucose-Capillary 174 (H) 70 - 99 mg/dL  Glucose, capillary  Result Value Ref Range   Glucose-Capillary 164 (H) 70 - 99 mg/dL  Glucose, capillary  Result Value Ref Range   Glucose-Capillary 220 (H) 70 - 99 mg/dL  Glucose, capillary  Result Value Ref Range   Glucose-Capillary 195 (H) 70 - 99 mg/dL  Magnesium  Result Value Ref Range   Magnesium 2.2 1.7 - 2.4 mg/dL  Phosphorus  Result Value Ref Range   Phosphorus 3.1 2.5 - 4.6 mg/dL  Comprehensive metabolic panel  Result Value Ref Range   Sodium 138 135 - 145 mmol/L   Potassium 3.3 (L) 3.5 - 5.1 mmol/L   Chloride 93 (L) 98 - 111 mmol/L   CO2 35 (H) 22 - 32 mmol/L   Glucose, Bld 174 (H) 70 - 99 mg/dL   BUN 45 (H) 8 - 23 mg/dL   Creatinine, Ser 1.76 (H) 0.44 - 1.00 mg/dL   Calcium 9.3 8.9 - 10.3 mg/dL   Total Protein 6.1 (L) 6.5 - 8.1 g/dL   Albumin 3.8 3.5 - 5.0 g/dL   AST 19 15 - 41 U/L   ALT 16 0 - 44 U/L   Alkaline Phosphatase 71 38 - 126 U/L   Total Bilirubin 2.3 (H) 0.3 - 1.2 mg/dL   GFR, Estimated 30 (L) >60 mL/min   Anion gap 10 5 - 15  CBC  Result Value Ref Range   WBC 5.7 4.0 - 10.5 K/uL   RBC 3.38 (L) 3.87 - 5.11 MIL/uL   Hemoglobin 9.6 (L) 12.0 - 15.0 g/dL   HCT 30.1 (L) 36.0 - 46.0 %   MCV 89.1 80.0 - 100.0 fL   MCH 28.4 26.0 - 34.0 pg   MCHC 31.9 30.0 - 36.0 g/dL   RDW 25.7 (H) 11.5 - 15.5 %   Platelets 356 150 - 400 K/uL   nRBC 0.0 0.0 - 0.2 %  Glucose, capillary  Result Value Ref Range   Glucose-Capillary 171 (H) 70 - 99 mg/dL  Glucose, capillary  Result Value Ref Range   Glucose-Capillary 158 (H) 70 - 99 mg/dL  Glucose, capillary  Result Value Ref Range   Glucose-Capillary 246 (H) 70 - 99 mg/dL  Glucose, capillary  Result Value Ref Range   Glucose-Capillary 175 (H) 70 - 99 mg/dL  Magnesium  Result Value Ref Range   Magnesium 2.1 1.7 - 2.4 mg/dL  Phosphorus  Result  Value Ref Range   Phosphorus 2.7 2.5 - 4.6 mg/dL  Comprehensive metabolic panel  Result Value Ref Range   Sodium 138 135 - 145 mmol/L   Potassium 3.8 3.5 - 5.1 mmol/L   Chloride 96 (L) 98 - 111 mmol/L   CO2 34 (H) 22 - 32 mmol/L   Glucose, Bld 178 (H) 70 - 99 mg/dL   BUN 43 (H) 8 - 23 mg/dL   Creatinine, Ser 1.67 (H) 0.44 - 1.00 mg/dL   Calcium 9.4 8.9 - 10.3 mg/dL   Total Protein 6.1 (L) 6.5 - 8.1 g/dL   Albumin 3.6 3.5 - 5.0 g/dL   AST 20 15 - 41 U/L   ALT 14 0 - 44 U/L   Alkaline Phosphatase 77 38 - 126 U/L   Total Bilirubin 2.0 (H) 0.3 - 1.2 mg/dL   GFR, Estimated 32 (L) >60 mL/min   Anion gap 8 5 - 15  CBC with Differential/Platelet  Result Value Ref Range   WBC 4.5 4.0 - 10.5 K/uL  RBC 3.65 (L) 3.87 - 5.11 MIL/uL   Hemoglobin 9.9 (L) 12.0 - 15.0 g/dL   HCT 32.8 (L) 36.0 - 46.0 %   MCV 89.9 80.0 - 100.0 fL   MCH 27.1 26.0 - 34.0 pg   MCHC 30.2 30.0 - 36.0 g/dL   RDW 25.8 (H) 11.5 - 15.5 %   Platelets 380 150 - 400 K/uL   nRBC 0.0 0.0 - 0.2 %   Neutrophils Relative % 71 %   Neutro Abs 3.2 1.7 - 7.7 K/uL   Lymphocytes Relative 10 %   Lymphs Abs 0.4 (L) 0.7 - 4.0 K/uL   Monocytes Relative 13 %   Monocytes Absolute 0.6 0.1 - 1.0 K/uL   Eosinophils Relative 5 %   Eosinophils Absolute 0.2 0.0 - 0.5 K/uL   Basophils Relative 1 %   Basophils Absolute 0.1 0.0 - 0.1 K/uL   WBC Morphology MORPHOLOGY UNREMARKABLE    Smear Review Normal platelet morphology    Immature Granulocytes 0 %   Abs Immature Granulocytes 0.02 0.00 - 0.07 K/uL   Spherocytes PRESENT    Ovalocytes PRESENT   Glucose, capillary  Result Value Ref Range   Glucose-Capillary 187 (H) 70 - 99 mg/dL  Glucose, capillary  Result Value Ref Range   Glucose-Capillary 165 (H) 70 - 99 mg/dL  Glucose, capillary  Result Value Ref Range   Glucose-Capillary 219 (H) 70 - 99 mg/dL  Glucose, capillary  Result Value Ref Range   Glucose-Capillary 205 (H) 70 - 99 mg/dL  Comprehensive metabolic panel  Result Value Ref  Range   Sodium 139 135 - 145 mmol/L   Potassium 3.5 3.5 - 5.1 mmol/L   Chloride 93 (L) 98 - 111 mmol/L   CO2 36 (H) 22 - 32 mmol/L   Glucose, Bld 172 (H) 70 - 99 mg/dL   BUN 45 (H) 8 - 23 mg/dL   Creatinine, Ser 1.70 (H) 0.44 - 1.00 mg/dL   Calcium 9.6 8.9 - 10.3 mg/dL   Total Protein 6.1 (L) 6.5 - 8.1 g/dL   Albumin 3.6 3.5 - 5.0 g/dL   AST 22 15 - 41 U/L   ALT 15 0 - 44 U/L   Alkaline Phosphatase 78 38 - 126 U/L   Total Bilirubin 2.0 (H) 0.3 - 1.2 mg/dL   GFR, Estimated 31 (L) >60 mL/min   Anion gap 10 5 - 15  CBC with Differential/Platelet  Result Value Ref Range   WBC 4.1 4.0 - 10.5 K/uL   RBC 3.71 (L) 3.87 - 5.11 MIL/uL   Hemoglobin 10.2 (L) 12.0 - 15.0 g/dL   HCT 32.8 (L) 36.0 - 46.0 %   MCV 88.4 80.0 - 100.0 fL   MCH 27.5 26.0 - 34.0 pg   MCHC 31.1 30.0 - 36.0 g/dL   RDW 25.1 (H) 11.5 - 15.5 %   Platelets 365 150 - 400 K/uL   nRBC 0.0 0.0 - 0.2 %   Neutrophils Relative % 66 %   Neutro Abs 2.7 1.7 - 7.7 K/uL   Lymphocytes Relative 13 %   Lymphs Abs 0.6 (L) 0.7 - 4.0 K/uL   Monocytes Relative 15 %   Monocytes Absolute 0.6 0.1 - 1.0 K/uL   Eosinophils Relative 4 %   Eosinophils Absolute 0.2 0.0 - 0.5 K/uL   Basophils Relative 1 %   Basophils Absolute 0.1 0.0 - 0.1 K/uL   RBC Morphology HYPOCHROMIC,ANISOCYTOSIS    Smear Review Normal platelet morphology    Immature Granulocytes 1 %  Abs Immature Granulocytes 0.02 0.00 - 0.07 K/uL   Polychromasia PRESENT   Magnesium  Result Value Ref Range   Magnesium 2.1 1.7 - 2.4 mg/dL  Phosphorus  Result Value Ref Range   Phosphorus 3.1 2.5 - 4.6 mg/dL  Glucose, capillary  Result Value Ref Range   Glucose-Capillary 179 (H) 70 - 99 mg/dL  Glucose, capillary  Result Value Ref Range   Glucose-Capillary 147 (H) 70 - 99 mg/dL   Comment 1 Notify RN    Comment 2 Document in Chart    *Note: Due to a large number of results and/or encounters for the requested time period, some results have not been displayed. A complete set of  results can be found in Results Review.      Assessment & Plan:   Problem List Items Addressed This Visit       Cardiovascular and Mediastinum   Acute on chronic diastolic CHF (congestive heart failure) (HCC)   Pericardial effusion     Other   Anasarca - Primary   Other Visit Diagnoses     Pulmonary hypertension, primary ([place])       Pleural effusion, left       AKI (acute kidney injury) ([place])       Iron deficiency anemia due to chronic blood loss            Follow up plan: No follow-ups on file.  1 hour spent with patient and her husband today

## 2021-09-07 ENCOUNTER — Other Ambulatory Visit
Admission: RE | Admit: 2021-09-07 | Discharge: 2021-09-07 | Disposition: A | Discharge status: Home / Self Care | Payer: Medicare PPO | Source: Ambulatory Visit | Attending: Nurse Practitioner | Admitting: Nurse Practitioner

## 2021-09-07 ENCOUNTER — Ambulatory Visit: Payer: Self-pay | Admitting: *Deleted

## 2021-09-07 ENCOUNTER — Other Ambulatory Visit: Payer: Self-pay

## 2021-09-07 ENCOUNTER — Ambulatory Visit: Payer: Medicare PPO | Admitting: Nurse Practitioner

## 2021-09-07 ENCOUNTER — Encounter: Payer: Self-pay | Admitting: Family Medicine

## 2021-09-07 ENCOUNTER — Other Ambulatory Visit: Payer: Self-pay | Admitting: Nurse Practitioner

## 2021-09-07 ENCOUNTER — Encounter: Payer: Self-pay | Admitting: Nurse Practitioner

## 2021-09-07 VITALS — BP 98/68 | HR 86 | Ht 62.0 in | Wt 180.0 lb

## 2021-09-07 DIAGNOSIS — I5032 Chronic diastolic (congestive) heart failure: Secondary | ICD-10-CM

## 2021-09-07 DIAGNOSIS — N1832 Chronic kidney disease, stage 3b: Secondary | ICD-10-CM

## 2021-09-07 DIAGNOSIS — I3139 Other pericardial effusion (noninflammatory): Secondary | ICD-10-CM

## 2021-09-07 DIAGNOSIS — I4821 Permanent atrial fibrillation: Secondary | ICD-10-CM

## 2021-09-07 DIAGNOSIS — K922 Gastrointestinal hemorrhage, unspecified: Secondary | ICD-10-CM

## 2021-09-07 DIAGNOSIS — N179 Acute kidney failure, unspecified: Secondary | ICD-10-CM | POA: Diagnosis not present

## 2021-09-07 DIAGNOSIS — E871 Hypo-osmolality and hyponatremia: Secondary | ICD-10-CM

## 2021-09-07 DIAGNOSIS — E119 Type 2 diabetes mellitus without complications: Secondary | ICD-10-CM

## 2021-09-07 DIAGNOSIS — E876 Hypokalemia: Secondary | ICD-10-CM

## 2021-09-07 DIAGNOSIS — N183 Chronic kidney disease, stage 3 unspecified: Secondary | ICD-10-CM

## 2021-09-07 DIAGNOSIS — I10 Essential (primary) hypertension: Secondary | ICD-10-CM

## 2021-09-07 DIAGNOSIS — I5033 Acute on chronic diastolic (congestive) heart failure: Secondary | ICD-10-CM | POA: Insufficient documentation

## 2021-09-07 DIAGNOSIS — I270 Primary pulmonary hypertension: Secondary | ICD-10-CM | POA: Insufficient documentation

## 2021-09-07 DIAGNOSIS — I130 Hypertensive heart and chronic kidney disease with heart failure and stage 1 through stage 4 chronic kidney disease, or unspecified chronic kidney disease: Secondary | ICD-10-CM

## 2021-09-07 DIAGNOSIS — J90 Pleural effusion, not elsewhere classified: Secondary | ICD-10-CM

## 2021-09-07 LAB — BASIC METABOLIC PANEL
Anion gap: 16 — ABNORMAL HIGH (ref 5–15)
BUN: 80 mg/dL — ABNORMAL HIGH (ref 8–23)
CO2: 34 mmol/L — ABNORMAL HIGH (ref 22–32)
Calcium: 10.1 mg/dL (ref 8.9–10.3)
Chloride: 77 mmol/L — ABNORMAL LOW (ref 98–111)
Creatinine, Ser: 2.29 mg/dL — ABNORMAL HIGH (ref 0.44–1.00)
GFR, Estimated: 22 mL/min — ABNORMAL LOW (ref >60)
Glucose, Bld: 483 mg/dL — ABNORMAL HIGH (ref 70–99)
Potassium: 2.7 mmol/L — CL (ref 3.5–5.1)
Sodium: 127 mmol/L — ABNORMAL LOW (ref 135–145)

## 2021-09-07 NOTE — Assessment & Plan Note (Signed)
Following with cardiology and nephrology. Euvolemic today. Continue to monitor. Call with any concerns.

## 2021-09-07 NOTE — Assessment & Plan Note (Signed)
Continue to follow with cardiology and pulmonology. Call with any concerns. Continue to monitor.

## 2021-09-07 NOTE — Telephone Encounter (Signed)
Thank you.  I've spoken with Karen Farley this evening.

## 2021-09-07 NOTE — Telephone Encounter (Signed)
Call received from [facility], from lab at [facility] [phone]. Report of critical lab value for potassium 2.7 at 3:44 pm. Call placed to practice . No answer from clinical staff at this time. Critical value sent to PCP.

## 2021-09-07 NOTE — Assessment & Plan Note (Signed)
Seeing cardiology on Friday. Will continue to monitor closely as she has needed it drained about every 2 weeks. Referral to pulmonology placed today.

## 2021-09-07 NOTE — Assessment & Plan Note (Signed)
Seeing cardiology on Friday. To see heart failure clinic. Continue current regimen. Continue to monitor. Call with any concerns.

## 2021-09-07 NOTE — Progress Notes (Signed)
BP 106/70   Pulse 83   Temp 97.6 F (36.4 C)   Wt 184 lb 6.4 oz (83.6 kg)   SpO2 100%   BMI 33.73 kg/m    Subjective:    Patient ID: Karen Farley, female    DOB: 1946/02/01, 75 y.o.   MRN: [id number]  HPI: Karen Farley is a 75 y.o. female  Chief Complaint  Patient presents with  . Hospitalization Follow-up   Transition of Care Hospital Follow up.    Hospital/Facility: [facility] D/C Physician: Dr. Posey Pronto D/C Date: 08/28/21   Records Requested: 09/04/21 Records Received: 09/04/21 Records Reviewed: 09/04/21   Diagnoses on Discharge: Severe Anasarca with Acute on Chronic CHF Pulmonary HTN Large Pericardial effusion--s/p pericardiocentesis Large Pleural effusion--s/p rec thoracentesis Acute on CKD-3B Persistent Afib on po DOAC   Date of interactive Contact within 48 hours of discharge: 08/28/21- phone   Date of 7 day or 14 day face-to-face visit:  09/04/21  within 7 days       Outpatient Encounter Medications as of 09/04/2021  Medication Sig  . apixaban (ELIQUIS) 5 MG TABS tablet Take 1 tablet (5 mg total) by mouth 2 (two) times daily.  Marland Kitchen docusate sodium (COLACE) 100 MG capsule Take 1 capsule (100 mg total) by mouth daily.  Regino Schultze Bandages & Supports (MEDICAL COMPRESSION STOCKINGS) MISC by Does not apply route.  . folic acid (FOLVITE) 1 MG tablet Take 1 tablet (1 mg total) by mouth daily.  . hydrocortisone (ANUSOL-HC) 2.5 % rectal cream Place rectally 3 (three) times daily.  . iron polysaccharides (NIFEREX) 150 MG capsule Take 1 capsule (150 mg total) by mouth daily.  Marland Kitchen latanoprost (XALATAN) 0.005 % ophthalmic solution Place 1 drop into both eyes at bedtime.  Marland Kitchen linagliptin (TRADJENTA) 5 MG TABS tablet Take 1 tablet (5 mg total) by mouth daily.  . metolazone (ZAROXOLYN) 2.5 MG tablet Take 1 tablet (2.5 mg total) by mouth daily.  . metoprolol succinate (TOPROL-XL) 25 MG 24 hr tablet Take 0.5 tablets (12.5 mg total) by mouth daily.  . midodrine (PROAMATINE) 10 MG  tablet Take 1 tablet (10 mg total) by mouth 3 (three) times daily with meals.  . Multiple Vitamin (MULTIVITAMIN WITH MINERALS) TABS tablet Take 1 tablet by mouth daily.  . pantoprazole (PROTONIX) 40 MG tablet Take 1 tablet (40 mg total) by mouth 2 (two) times daily.  . polyethylene glycol (MIRALAX / GLYCOLAX) 17 g packet Take 17 g by mouth daily.  . potassium chloride SA (KLOR-CON) 20 MEQ tablet Take 1 tablet (20 mEq total) by mouth daily.  Marland Kitchen spironolactone (ALDACTONE) 25 MG tablet Take 1 tablet (25 mg total) by mouth daily.  Marland Kitchen torsemide 40 MG TABS Take 40 mg by mouth daily.  . traMADol (ULTRAM) 50 MG tablet Take 1 tablet (50 mg total) by mouth every 12 (twelve) hours as needed for moderate pain.  . vitamin B-12 (CYANOCOBALAMIN) 500 MCG tablet Take 1 tablet (500 mcg total) by mouth daily.    No facility-administered encounter medications on file as of 09/04/2021.  Per Hospitalist: " HOSPITAL COURSE:    75 y.o. WF PMHx HFpEF, permanent atrial fibrillation on Eliquis, PAD, type 2 diabetes, CKD stage IIIb, lymphedema   Presented to hospital with increasing shortness of breath and edema.  Patient has been noticing increasing abdominal distention since her admission in August with severe lower extremity edema and dyspnea and exertion.   Severe Anasarca acute on chronic diastolic heart failure pulmonary hypertension   -status post  thoracocentesis with removal of 600 mL of fluid on 07/28/2021. --10/20-- torsemide 20 mg qd with IV albumin --10/21-- IV lqasix 40 mg bid per cardiology --10/22 cont IV lasix--good uop --10/23--IV lasix --10/24-- Dr Juleen China wants to start IV lasxi gtt --10/25-cont Lasix gtt -11/1 decrease Metoprolol 12.5 mg BID per cardiology -11/1 Zaroxolyn 2.5 mg x 1 --11/2--IV lasix gtt + zaroxylyn total17 liters UOP, weight trending down --11/3--cont IV lasix gtt. Get Echo to see status of pericardial effusion --11/4-- continue Lasix drip per Dr. Rockey Situ. He wants to keep  patient over the weekend. -- Repeat Echo 11/3-- moderate pericardial effusion. No evidence of tamponade --11/5 Lasix gtt + zaroxylyn 2.5 x1 --11/6-- lasix gtt --11/7-- Dr Fletcher Anon ok to d/c lasix gtt and start po torsemide 40 mg qd, spironolactone 25 mg qd  and Metolazone 2.5 mg qd --11/8--cont po diuretics   Large pericardial effusion  -S/p percardiocentesis x1 10/16   Large Pleural effusion s/p thoracentesis Patient underwent a pericardiocentesis on 08/05/2021 with removal of 700 mL of pleural fluid.  Inflammatory markers were unremarkable.  Cardiology monitoring the patient while in the hospital.  Still on vasopressors due to hypotension. --repeat echo 10/21--no pericaridal effusion   Acute kidney injury on CKD stage 3b(Baseline Cr 1.4-1.7) - Currently at baseline.   --Nephrology followed the patient during hospitalization.  -- Benazepril on hold at d/c for now   Permanent atrial fibrillation --Was previously on Eliquis.  Currently on hold due to GI bleed.  -- Rate controlled at this time--not on any po rate controlling meds --10/21--started on IV heparin gtt--monitor hgb  --10/22-- hgb remains stable --10/23-- discussed with G.I. Dr. Virgina Jock and cardiology Dr. Rockey Situ plan is to move forward with starting eliquis while patient is in-house to make sure she tolerates. DC heparin drip. Patient is in agreement with plan. --hgb stable   GI bleed with hemorrhagic shock. --Patient was noted to have significant drop in hemoglobin to 9.7 from 12.4 and was transferred to the ICU.   --GI Dr Alice Reichert from 10/19-- May forego endoluminal evaluation for the near future given lack of bleeding recurrence and persistence of cardiopulmonary issues. Patient is in agreement. --10/23-- d/w Dr Russo--no Rectal bleed. Ok to resume po eliquis    DM type II with hyperglycemia Diet controlled at home.   -- Sensitive SSI   Iron deficiency/Vitamin B12  --patient received Venofer -cont oral iron and vitamin B12  supplements.    Procedures: 10/8 s/p left thoracentesis: 652m aspirated 10/12 paracentesis 1.2L aspirated .  10/16 Echocardiogram performed: Pericardiocentesis 7069maspirated and drain placed  Failed Lasix IV advanced to Lasix gtt; however, Lasix and Eliquis stopped 10/15 and patient transferred to ICU after had episode of GI bleed with drop in hgb,  s/p 2U pRBC and vasopressor support initiated.   10/21 Echocardiogram LVEF= 60 to 65%.  -Pulmonary HTN: Assumed right atrial pressure of 15 mmHg, the  estimated right ventricular systolic pressure is 6116.1mHg.  Pericardium: A small pericardial effusion is present.  10/27 LEFT thoracentesis: Aspirated 700 mL of clear yellow fluid was removed"   Diagnostic Tests Reviewed CLINICAL DATA:  Post thoracentesis   EXAM: PORTABLE CHEST 1 VIEW   COMPARISON:  Portable exam 1340 hours compared to 08/14/2021   FINDINGS: RIGHT jugular line with tip projecting over RIGHT atrium.   Enlargement of cardiac silhouette with pulmonary vascular congestion.   Decreased LEFT pleural effusion and basilar atelectasis post thoracentesis.   No pneumothorax.   Small persistent RIGHT pleural effusion and mild  RIGHT basilar atelectasis.   Osseous structures unremarkable.   IMPRESSION: No pneumothorax following LEFT thoracentesis.   Enlargement of cardiac silhouette with small bibasilar pleural effusions and atelectasis.  INDICATION: Left pleural effusion   EXAM: ULTRASOUND GUIDED LEFT THORACENTESIS   MEDICATIONS: None.   COMPLICATIONS: None immediate.   PROCEDURE: An ultrasound guided thoracentesis was thoroughly discussed with the patient and questions answered. The benefits, risks, alternatives and complications were also discussed. The patient understands and wishes to proceed with the procedure. Written consent was obtained.   Ultrasound was performed to localize and mark an adequate pocket of fluid in the left chest. The area was  then prepped and draped in the normal sterile fashion. 1% Lidocaine was used for local anesthesia. Under ultrasound guidance a 19 gauge Yueh catheter was introduced. Thoracentesis was performed. The catheter was removed and a dressing applied.   FINDINGS: A total of approximately 700 mL of clear yellow fluid was removed. Samples were sent to the laboratory as requested by the clinical team.   IMPRESSION: Successful ultrasound guided left thoracentesis yielding 700 mL of clear pleural fluid.  CLINICAL DATA:  Painless bloody stools with down trending hemoglobin, initial encounter   EXAM: CTA ABDOMEN AND PELVIS WITHOUT AND WITH CONTRAST   TECHNIQUE: Multidetector CT imaging of the abdomen and pelvis was performed using the standard protocol during bolus administration of intravenous contrast. Multiplanar reconstructed images and MIPs were obtained and reviewed to evaluate the vascular anatomy.   CONTRAST:  58m OMNIPAQUE IOHEXOL 350 MG/ML SOLN   COMPARISON:  None.   FINDINGS: VASCULAR   Aorta: Atherosclerotic calcifications of the abdominal aorta are noted. No aneurysmal dilatation or dissection is seen. The aorta is somewhat diminutive   Celiac: Atherosclerotic calcifications are noted at the origin with approximately 50% stenosis and mild poststenotic dilatation in the celiac.   SMA: Atherosclerotic calcifications are noted with high-grade stenosis although no significant poststenotic dilatation is seen.   Renals: Single renal arteries are identified bilaterally. High-grade stenosis is noted in the origin of the renal arteries bilaterally with somewhat diminutive for renal artery branches likely related to the hypotensive state.   IMA: Patent without evidence of aneurysm, dissection, vasculitis or significant stenosis.   Inflow: Iliacs demonstrate atherosclerotic calcification without focal high-grade stenosis.   Veins: No specific venous abnormality is noted.    Review of the MIP images confirms the above findings.   NON-VASCULAR   Lower chest: Lung bases demonstrate evidence of left-sided pleural effusion with left lower lobe consolidation. Large pericardial effusion is noted   Hepatobiliary: Liver is fatty infiltrated. Some mild nodularity is noted suggestive of underlying cirrhosis. Mild perihepatic ascites is seen. The gallbladder has been surgically removed.   Pancreas: Unremarkable. No pancreatic ductal dilatation or surrounding inflammatory changes.   Spleen: Normal in size without focal abnormality.   Adrenals/Urinary Tract: Adrenal glands appear within normal limits. Kidneys demonstrate a normal enhancement pattern bilaterally. No obstructive changes are seen. The bladder is partially distended.   Stomach/Bowel: In the rectum on the arterial phase images there is a focal area of contrast extravasation identified best seen on image number 225 of series 5 and image number 135 and 134 of series 10. Subsequent venous phase images show this to be more diffusely distributed within the rectum consistent with focal GI bleeding. No other area of contrast extravasation is seen. No other pooling of contrast is noted within the colon. Scattered diverticular change is seen without evidence of diverticulitis. Small bowel and stomach appear within  normal limits.   Lymphatic: No significant lymphadenopathy is noted.   Reproductive: Uterus demonstrates multiple calcified uterine fibroids. No definitive adnexal mass is seen.   Other: Mild ascites is noted surrounding the liver and spleen. Mild changes of anasarca are noted   Musculoskeletal: Degenerative changes of lumbar spine and hip joints are noted. No acute bony abnormality is seen.   IMPRESSION: VASCULAR   Changes consistent with active GI hemorrhage within the distal rectum as described. No other focal area of acute hemorrhage is seen.   Multifocal narrowing involving the  celiac axis, superior mesenteric artery and bilateral renal arteries.   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 Mild ascites is noted as well.   Left-sided pleural effusion with lower lobe consolidation.   Large pericardial effusion.   Diverticulosis without diverticulitis.   Uterine fibroid change.   Aortic Atherosclerosis (ICD10-I70.0).  Disposition: Home    Consults: Pulmnology, cardiology,    Discharge Instructions: Follow up here, with cardiology and pulmonology   Disease/illness Education: Discussed today   Home Health/Community Services Discussions/Referrals: In place   Establishment or re-establishment of referral orders for community resources: In place   Discussion with other health care providers: N/A   Assessment and Support of treatment regimen adherence: Good   Appointments Coordinated with: Patient and husband   Education for self-management, independent living, and ADLs: Discussed today  Since getting out of the hospital, Idabell has been feeling very tired, but she is feeling like she is able to do things. She feels like she has some get up and go for the first time in months. She was very happy with her care in the hospital. She notes that she is not SOB. No CP. Her belly continues with some swelling, but her legs are doing well. She is otherwise feeling well with no other concerns or complaints at this time.   Relevant past medical, surgical, family and social history reviewed and updated as indicated. Interim medical history since our last visit reviewed. Allergies and medications reviewed and updated.  Review of Systems  Constitutional:  Positive for fatigue. Negative for activity change, appetite change, chills, diaphoresis, fever and unexpected weight change.  Respiratory: Negative.    Cardiovascular:  Positive for leg swelling. Negative for chest pain and palpitations.  Gastrointestinal: Negative.   Genitourinary:  Negative.   Musculoskeletal: Negative.   Skin: Negative.   Neurological:  Positive for weakness. Negative for dizziness, tremors, seizures, syncope, facial asymmetry, speech difficulty, light-headedness, numbness and headaches.  Psychiatric/Behavioral: Negative.     Per HPI unless specifically indicated above     Objective:    BP 106/70   Pulse 83   Temp 97.6 F (36.4 C)   Wt 184 lb 6.4 oz (83.6 kg)   SpO2 100%   BMI 33.73 kg/m   Wt Readings from Last 3 Encounters:  09/07/21 180 lb (81.6 kg)  09/04/21 184 lb 6.4 oz (83.6 kg)  08/28/21 193 lb 14.4 oz (88 kg)    Physical Exam Vitals and nursing note reviewed.  Constitutional:      General: She is not in acute distress.    Appearance: Normal appearance. She is not ill-appearing, toxic-appearing or diaphoretic.  HENT:     Head: Normocephalic and atraumatic.     Right Ear: External ear normal.     Left Ear: External ear normal.     Nose: Nose normal.     Mouth/Throat:     Mouth: Mucous membranes are moist.  Pharynx: Oropharynx is clear.  Eyes:     General: No scleral icterus.       Right eye: No discharge.        Left eye: No discharge.     Extraocular Movements: Extraocular movements intact.     Conjunctiva/sclera: Conjunctivae normal.     Pupils: Pupils are equal, round, and reactive to light.  Cardiovascular:     Rate and Rhythm: Normal rate and regular rhythm.     Pulses: Normal pulses.     Heart sounds: Normal heart sounds. No murmur heard.   No friction rub. No gallop.  Pulmonary:     Effort: Pulmonary effort is normal. No respiratory distress.     Breath sounds: Normal breath sounds. No stridor. No wheezing, rhonchi or rales.     Comments: Decreased breath sounds on LLL Chest:     Chest wall: No tenderness.  Musculoskeletal:        General: Normal range of motion.     Cervical back: Normal range of motion and neck supple.  Skin:    General: Skin is warm and dry.     Capillary Refill: Capillary refill  takes less than 2 seconds.     Coloration: Skin is not jaundiced or pale.     Findings: No bruising, erythema, lesion or rash.  Neurological:     General: No focal deficit present.     Mental Status: She is alert and oriented to person, place, and time. Mental status is at baseline.  Psychiatric:        Mood and Affect: Mood normal.        Behavior: Behavior normal.        Thought Content: Thought content normal.        Judgment: Judgment normal.    Results for orders placed or performed during the hospital encounter of 07/26/21  Resp Panel by RT-PCR (Flu A&B, Covid) Nasopharyngeal Swab   Specimen: Nasopharyngeal Swab; Nasopharyngeal(NP) swabs in vial transport medium  Result Value Ref Range   SARS Coronavirus 2 by RT PCR NEGATIVE NEGATIVE   Influenza A by PCR NEGATIVE NEGATIVE   Influenza B by PCR NEGATIVE NEGATIVE  Body fluid culture w Gram Stain   Specimen: PATH Cytology Pleural fluid  Result Value Ref Range   Specimen Description      PLEURAL Performed at [facility], [address], [place], [place] [address]    Special Requests      NONE Performed at [facility], [address] [place], [place] [address]    Gram Stain      NO SQUAMOUS EPITHELIAL CELLS SEEN FEW WBC SEEN NO ORGANISMS SEEN    Culture      NO GROWTH 3 DAYS Performed at [facility], [place] [address], [place], [place] [address]    Report Status 08/01/2021 FINAL   Body fluid culture w Gram Stain   Specimen: PATH Cytology Peritoneal fluid  Result Value Ref Range   Specimen Description      PERITONEAL Performed at [facility], [address] [place], [place] [address]    Special Requests      NONE Performed at [facility], [place], [place] [address]    Gram Stain      RARE WBC PRESENT,BOTH PMN AND MONONUCLEAR NO ORGANISMS SEEN    Culture      NO GROWTH 3 DAYS Performed at [facility], [place] [address] [place], [place]  [address]    Report Status 08/05/2021 FINAL   MRSA Next Gen by PCR, Nasal   Specimen: Nasal Mucosa; Nasal Swab  Result Value Ref Range   MRSA by PCR Next Gen NOT DETECTED NOT DETECTED  Body fluid culture w Gram Stain   Specimen: Pericardial  Result Value Ref Range   Specimen Description      PERICARDIAL Performed at [facility], [address], [place], [place] [address]    Special Requests      NONE Performed at [facility], [place], [place] [address]    Gram Stain      FEW WBC PRESENT, PREDOMINANTLY MONONUCLEAR NO ORGANISMS SEEN    Culture      NO GROWTH 3 DAYS Performed at [facility], [place] [address], [place], [place] [address]    Report Status 08/09/2021 FINAL   Fungus Culture With Stain   Specimen: PATH Cytology Pleural fluid  Result Value Ref Range   Fungus Stain Final report    Fungus (Mycology) Culture PENDING    Fungal Source PLEURAL   Acid Fast Smear (AFB)   Specimen: PATH Cytology Pleural fluid  Result Value Ref Range   AFB Specimen Processing Concentration    Acid Fast Smear Negative    Source (AFB) PLEURAL   Body fluid culture w Gram Stain   Specimen: PATH Cytology Pleural fluid  Result Value Ref Range   Specimen Description      PLEURAL Performed at [facility], [address], [place], [place] [address]    Special Requests      NONE Performed at [facility], [address] [place], [place] [address]    Gram Stain      WBC PRESENT, PREDOMINANTLY MONONUCLEAR NO ORGANISMS SEEN CYTOSPIN SMEAR    Culture      NO GROWTH 3 DAYS Performed at [facility], [place] [address] [place], [place] [address]    Report Status 08/19/2021 FINAL   Fungus Culture Result  Result Value Ref Range   Result 1 Comment   Basic metabolic panel  Result Value Ref Range   Sodium 128 (L) 135 - 145 mmol/L   Potassium 2.6 (LL) 3.5 - 5.1 mmol/L   Chloride 79 (L) 98 - 111 mmol/L   CO2 34 (H) 22 - 32  mmol/L   Glucose, Bld 195 (H) 70 - 99 mg/dL   BUN 65 (H) 8 - 23 mg/dL   Creatinine, Ser 1.78 (H) 0.44 - 1.00 mg/dL   Calcium 9.4 8.9 - 10.3 mg/dL   GFR, Estimated 29 (L) >60 mL/min   Anion gap 15 5 - 15  CBC  Result Value Ref Range   WBC 8.1 4.0 - 10.5 K/uL   RBC 5.51 (H) 3.87 - 5.11 MIL/uL   Hemoglobin 13.6 12.0 - 15.0 g/dL   HCT 42.5 36.0 - 46.0 %   MCV 77.1 (L) 80.0 - 100.0 fL   MCH 24.7 (L) 26.0 - 34.0 pg   MCHC 32.0 30.0 - 36.0 g/dL   RDW 18.9 (H) 11.5 - 15.5 %   Platelets 341 150 - 400 K/uL   nRBC 0.0 0.0 - 0.2 %  Brain natriuretic peptide  Result Value Ref Range   B Natriuretic Peptide 154.2 (H) 0.0 - 100.0 pg/mL  Magnesium  Result Value Ref Range   Magnesium 2.2 1.7 - 2.4 mg/dL  Hepatic function panel  Result Value Ref Range   Total Protein 6.1 (L) 6.5 -  8.1 g/dL   Albumin 3.2 (L) 3.5 - 5.0 g/dL   AST 27 15 - 41 U/L   ALT 16 0 - 44 U/L   Alkaline Phosphatase 68 38 - 126 U/L   Total Bilirubin 2.3 (H) 0.3 - 1.2 mg/dL   Bilirubin, Direct 0.7 (H) 0.0 - 0.2 mg/dL   Indirect Bilirubin 1.6 (H) 0.3 - 0.9 mg/dL  TSH  Result Value Ref Range   TSH 2.180 0.350 - 4.500 uIU/mL  Basic metabolic panel  Result Value Ref Range   Sodium 130 (L) 135 - 145 mmol/L   Potassium 2.8 (L) 3.5 - 5.1 mmol/L   Chloride 83 (L) 98 - 111 mmol/L   CO2 33 (H) 22 - 32 mmol/L   Glucose, Bld 147 (H) 70 - 99 mg/dL   BUN 63 (H) 8 - 23 mg/dL   Creatinine, Ser 1.57 (H) 0.44 - 1.00 mg/dL   Calcium 9.4 8.9 - 10.3 mg/dL   GFR, Estimated 34 (L) >60 mL/min   Anion gap 14 5 - 15  Basic metabolic panel  Result Value Ref Range   Sodium 126 (L) 135 - 145 mmol/L   Potassium 3.5 3.5 - 5.1 mmol/L   Chloride 82 (L) 98 - 111 mmol/L   CO2 32 22 - 32 mmol/L   Glucose, Bld 201 (H) 70 - 99 mg/dL   BUN 62 (H) 8 - 23 mg/dL   Creatinine, Ser 1.85 (H) 0.44 - 1.00 mg/dL   Calcium 9.1 8.9 - 10.3 mg/dL   GFR, Estimated 28 (L) >60 mL/min   Anion gap 12 5 - 15  Basic metabolic panel  Result Value Ref Range    Sodium 129 (L) 135 - 145 mmol/L   Potassium 2.8 (L) 3.5 - 5.1 mmol/L   Chloride 82 (L) 98 - 111 mmol/L   CO2 33 (H) 22 - 32 mmol/L   Glucose, Bld 138 (H) 70 - 99 mg/dL   BUN 59 (H) 8 - 23 mg/dL   Creatinine, Ser 1.68 (H) 0.44 - 1.00 mg/dL   Calcium 9.1 8.9 - 10.3 mg/dL   GFR, Estimated 32 (L) >60 mL/min   Anion gap 14 5 - 15  CBC with Differential/Platelet  Result Value Ref Range   WBC 6.4 4.0 - 10.5 K/uL   RBC 5.57 (H) 3.87 - 5.11 MIL/uL   Hemoglobin 13.2 12.0 - 15.0 g/dL   HCT 42.2 36.0 - 46.0 %   MCV 75.8 (L) 80.0 - 100.0 fL   MCH 23.7 (L) 26.0 - 34.0 pg   MCHC 31.3 30.0 - 36.0 g/dL   RDW 18.8 (H) 11.5 - 15.5 %   Platelets 354 150 - 400 K/uL   nRBC 0.0 0.0 - 0.2 %   Neutrophils Relative % 74 %   Neutro Abs 4.8 1.7 - 7.7 K/uL   Lymphocytes Relative 10 %   Lymphs Abs 0.6 (L) 0.7 - 4.0 K/uL   Monocytes Relative 12 %   Monocytes Absolute 0.8 0.1 - 1.0 K/uL   Eosinophils Relative 2 %   Eosinophils Absolute 0.2 0.0 - 0.5 K/uL   Basophils Relative 1 %   Basophils Absolute 0.1 0.0 - 0.1 K/uL   Immature Granulocytes 1 %   Abs Immature Granulocytes 0.03 0.00 - 0.07 K/uL  Triglycerides, Body Fluid  Result Value Ref Range   Triglycerides, Fluid 31 Not Estab. mg/dL   Fluid Type-FTRIG CYTO PLEU   Body fluid cell count with differential  Result Value Ref Range   Fluid Type-FCT  CYTO PLEU    Color, Fluid YELLOW (A) YELLOW   Appearance, Fluid HAZY (A) CLEAR   Total Nucleated Cell Count, Fluid 672 cu mm   Neutrophil Count, Fluid 24 %   Lymphs, Fluid 23 %   Monocyte-Macrophage-Serous Fluid 53 %   Eos, Fluid 0 %   Other Cells, Fluid 0 %  Glucose, pleural or peritoneal fluid  Result Value Ref Range   Glucose, Fluid 168 mg/dL   Fluid Type-FGLU CYTO PLEU   Miscellaneous [facility] test (send-out)  Result Value Ref Range   [facility] test code [id number]    [facility] test name FLUID PH    Misc [facility] result COMMENT   Pathologist smear review  Result Value Ref Range   Path Review  Cytospin slide of left pleural fluid reviewed.   Basic metabolic panel  Result Value Ref Range   Sodium 127 (L) 135 - 145 mmol/L   Potassium 3.5 3.5 - 5.1 mmol/L   Chloride 86 (L) 98 - 111 mmol/L   CO2 32 22 - 32 mmol/L   Glucose, Bld 229 (H) 70 - 99 mg/dL   BUN 55 (H) 8 - 23 mg/dL   Creatinine, Ser 1.56 (H) 0.44 - 1.00 mg/dL   Calcium 9.3 8.9 - 10.3 mg/dL   GFR, Estimated 34 (L) >60 mL/min   Anion gap 9 5 - 15  Basic metabolic panel  Result Value Ref Range   Sodium 129 (L) 135 - 145 mmol/L   Potassium 3.5 3.5 - 5.1 mmol/L   Chloride 87 (L) 98 - 111 mmol/L   CO2 32 22 - 32 mmol/L   Glucose, Bld 152 (H) 70 - 99 mg/dL   BUN 51 (H) 8 - 23 mg/dL   Creatinine, Ser 1.52 (H) 0.44 - 1.00 mg/dL   Calcium 9.3 8.9 - 10.3 mg/dL   GFR, Estimated 36 (L) >60 mL/min   Anion gap 10 5 - 15  Glucose, capillary  Result Value Ref Range   Glucose-Capillary 202 (H) 70 - 99 mg/dL  Glucose, capillary  Result Value Ref Range   Glucose-Capillary 172 (H) 70 - 99 mg/dL  Basic metabolic panel  Result Value Ref Range   Sodium 128 (L) 135 - 145 mmol/L   Potassium 4.8 3.5 - 5.1 mmol/L   Chloride 87 (L) 98 - 111 mmol/L   CO2 28 22 - 32 mmol/L   Glucose, Bld 205 (H) 70 - 99 mg/dL   BUN 48 (H) 8 - 23 mg/dL   Creatinine, Ser 1.85 (H) 0.44 - 1.00 mg/dL   Calcium 9.5 8.9 - 10.3 mg/dL   GFR, Estimated 28 (L) >60 mL/min   Anion gap 13 5 - 15  CBC with Differential/Platelet  Result Value Ref Range   WBC 11.3 (H) 4.0 - 10.5 K/uL   RBC 5.52 (H) 3.87 - 5.11 MIL/uL   Hemoglobin 13.6 12.0 - 15.0 g/dL   HCT 41.9 36.0 - 46.0 %   MCV 75.9 (L) 80.0 - 100.0 fL   MCH 24.6 (L) 26.0 - 34.0 pg   MCHC 32.5 30.0 - 36.0 g/dL   RDW 18.7 (H) 11.5 - 15.5 %   Platelets 398 150 - 400 K/uL   nRBC 0.0 0.0 - 0.2 %   Neutrophils Relative % 87 %   Neutro Abs 9.9 (H) 1.7 - 7.7 K/uL   Lymphocytes Relative 5 %   Lymphs Abs 0.6 (L) 0.7 - 4.0 K/uL   Monocytes Relative 7 %   Monocytes Absolute 0.7 0.1 - 1.0  K/uL   Eosinophils  Relative 0 %   Eosinophils Absolute 0.0 0.0 - 0.5 K/uL   Basophils Relative 0 %   Basophils Absolute 0.0 0.0 - 0.1 K/uL   Immature Granulocytes 1 %   Abs Immature Granulocytes 0.07 0.00 - 0.07 K/uL  Basic metabolic panel  Result Value Ref Range   Sodium 128 (L) 135 - 145 mmol/L   Potassium 5.1 3.5 - 5.1 mmol/L   Chloride 91 (L) 98 - 111 mmol/L   CO2 22 22 - 32 mmol/L   Glucose, Bld 227 (H) 70 - 99 mg/dL   BUN 54 (H) 8 - 23 mg/dL   Creatinine, Ser 1.94 (H) 0.44 - 1.00 mg/dL   Calcium 9.3 8.9 - 10.3 mg/dL   GFR, Estimated 27 (L) >60 mL/min   Anion gap 15 5 - 15  Iron and TIBC  Result Value Ref Range   Iron 19 (L) 28 - 170 ug/dL   TIBC 484 (H) 250 - 450 ug/dL   Saturation Ratios 4 (L) 10.4 - 31.8 %   UIBC 465 ug/dL  Folate  Result Value Ref Range   Folate 8.3 >5.9 ng/mL  Vitamin B12  Result Value Ref Range   Vitamin B-12 284 180 - 914 pg/mL  VITAMIN D 25 Hydroxy (Vit-D Deficiency, Fractures)  Result Value Ref Range   Vit D, 25-Hydroxy 126.28 (H) 30 - 100 ng/mL  TSH  Result Value Ref Range   TSH 0.902 0.350 - 4.500 uIU/mL  Body fluid cell count with differential  Result Value Ref Range   Fluid Type-FCT CYTO PERI    Color, Fluid ORANGE (A) YELLOW   Appearance, Fluid TURBID (A) CLEAR   Total Nucleated Cell Count, Fluid 621 cu mm   Neutrophil Count, Fluid 16 %   Lymphs, Fluid 61 %   Monocyte-Macrophage-Serous Fluid 23 %   Eos, Fluid 0 %   Other Cells, Fluid 0 %  Albumin, pleural or peritoneal fluid   Result Value Ref Range   Albumin, Fluid 1.8 g/dL   Fluid Type-FALB CYTO PERI   Protein, pleural or peritoneal fluid  Result Value Ref Range   Total protein, fluid <3.0 g/dL   Fluid Type-FTP CYTO PERI   Glucose, capillary  Result Value Ref Range   Glucose-Capillary 328 (H) 70 - 99 mg/dL  Basic metabolic panel  Result Value Ref Range   Sodium 131 (L) 135 - 145 mmol/L   Potassium 4.8 3.5 - 5.1 mmol/L   Chloride 93 (L) 98 - 111 mmol/L   CO2 27 22 - 32 mmol/L    Glucose, Bld 253 (H) 70 - 99 mg/dL   BUN 57 (H) 8 - 23 mg/dL   Creatinine, Ser 1.84 (H) 0.44 - 1.00 mg/dL   Calcium 9.2 8.9 - 10.3 mg/dL   GFR, Estimated 28 (L) >60 mL/min   Anion gap 11 5 - 15  CBC  Result Value Ref Range   WBC 9.6 4.0 - 10.5 K/uL   RBC 5.34 (H) 3.87 - 5.11 MIL/uL   Hemoglobin 12.9 12.0 - 15.0 g/dL   HCT 40.2 36.0 - 46.0 %   MCV 75.3 (L) 80.0 - 100.0 fL   MCH 24.2 (L) 26.0 - 34.0 pg   MCHC 32.1 30.0 - 36.0 g/dL   RDW 18.5 (H) 11.5 - 15.5 %   Platelets 337 150 - 400 K/uL   nRBC 0.0 0.0 - 0.2 %  Magnesium  Result Value Ref Range   Magnesium 2.4 1.7 - 2.4 mg/dL  Phosphorus  Result Value Ref Range   Phosphorus 3.5 2.5 - 4.6 mg/dL  Protein, body fluid (other)  Result Value Ref Range   Total Protein, Body Fluid Other 2.9 g/dL   Source of Sample PERITONEAL   Glucose, capillary  Result Value Ref Range   Glucose-Capillary 290 (H) 70 - 99 mg/dL  Glucose, capillary  Result Value Ref Range   Glucose-Capillary 234 (H) 70 - 99 mg/dL  Glucose, capillary  Result Value Ref Range   Glucose-Capillary 312 (H) 70 - 99 mg/dL  Glucose, capillary  Result Value Ref Range   Glucose-Capillary 279 (H) 70 - 99 mg/dL  Basic metabolic panel  Result Value Ref Range   Sodium 135 135 - 145 mmol/L   Potassium 4.1 3.5 - 5.1 mmol/L   Chloride 95 (L) 98 - 111 mmol/L   CO2 29 22 - 32 mmol/L   Glucose, Bld 280 (H) 70 - 99 mg/dL   BUN 60 (H) 8 - 23 mg/dL   Creatinine, Ser 1.75 (H) 0.44 - 1.00 mg/dL   Calcium 9.2 8.9 - 10.3 mg/dL   GFR, Estimated 30 (L) >60 mL/min   Anion gap 11 5 - 15  CBC  Result Value Ref Range   WBC 8.1 4.0 - 10.5 K/uL   RBC 5.46 (H) 3.87 - 5.11 MIL/uL   Hemoglobin 13.3 12.0 - 15.0 g/dL   HCT 41.0 36.0 - 46.0 %   MCV 75.1 (L) 80.0 - 100.0 fL   MCH 24.4 (L) 26.0 - 34.0 pg   MCHC 32.4 30.0 - 36.0 g/dL   RDW 18.6 (H) 11.5 - 15.5 %   Platelets 332 150 - 400 K/uL   nRBC 0.0 0.0 - 0.2 %  Magnesium  Result Value Ref Range   Magnesium 2.7 (H) 1.7 - 2.4 mg/dL   Phosphorus  Result Value Ref Range   Phosphorus 3.3 2.5 - 4.6 mg/dL  Glucose, capillary  Result Value Ref Range   Glucose-Capillary 280 (H) 70 - 99 mg/dL  Glucose, capillary  Result Value Ref Range   Glucose-Capillary 253 (H) 70 - 99 mg/dL  Glucose, capillary  Result Value Ref Range   Glucose-Capillary 305 (H) 70 - 99 mg/dL  Glucose, capillary  Result Value Ref Range   Glucose-Capillary 274 (H) 70 - 99 mg/dL  Basic metabolic panel  Result Value Ref Range   Sodium 136 135 - 145 mmol/L   Potassium 3.8 3.5 - 5.1 mmol/L   Chloride 93 (L) 98 - 111 mmol/L   CO2 31 22 - 32 mmol/L   Glucose, Bld 214 (H) 70 - 99 mg/dL   BUN 62 (H) 8 - 23 mg/dL   Creatinine, Ser 1.75 (H) 0.44 - 1.00 mg/dL   Calcium 8.9 8.9 - 10.3 mg/dL   GFR, Estimated 30 (L) >60 mL/min   Anion gap 12 5 - 15  CBC  Result Value Ref Range   WBC 9.0 4.0 - 10.5 K/uL   RBC 5.13 (H) 3.87 - 5.11 MIL/uL   Hemoglobin 12.4 12.0 - 15.0 g/dL   HCT 38.4 36.0 - 46.0 %   MCV 74.9 (L) 80.0 - 100.0 fL   MCH 24.2 (L) 26.0 - 34.0 pg   MCHC 32.3 30.0 - 36.0 g/dL   RDW 18.6 (H) 11.5 - 15.5 %   Platelets 311 150 - 400 K/uL   nRBC 0.0 0.0 - 0.2 %  Magnesium  Result Value Ref Range   Magnesium 2.2 1.7 - 2.4 mg/dL  Phosphorus  Result Value  Ref Range   Phosphorus 2.7 2.5 - 4.6 mg/dL  Glucose, capillary  Result Value Ref Range   Glucose-Capillary 308 (H) 70 - 99 mg/dL  Glucose, capillary  Result Value Ref Range   Glucose-Capillary 211 (H) 70 - 99 mg/dL  Glucose, capillary  Result Value Ref Range   Glucose-Capillary 230 (H) 70 - 99 mg/dL  CBC  Result Value Ref Range   WBC 10.9 (H) 4.0 - 10.5 K/uL   RBC 3.95 3.87 - 5.11 MIL/uL   Hemoglobin 9.7 (L) 12.0 - 15.0 g/dL   HCT 30.0 (L) 36.0 - 46.0 %   MCV 75.9 (L) 80.0 - 100.0 fL   MCH 24.6 (L) 26.0 - 34.0 pg   MCHC 32.3 30.0 - 36.0 g/dL   RDW 18.3 (H) 11.5 - 15.5 %   Platelets 288 150 - 400 K/uL   nRBC 0.4 (H) 0.0 - 0.2 %  Hemoglobin and hematocrit, blood  Result Value Ref  Range   Hemoglobin 7.7 (L) 12.0 - 15.0 g/dL   HCT 24.0 (L) 36.0 - 46.0 %  Glucose, capillary  Result Value Ref Range   Glucose-Capillary 256 (H) 70 - 99 mg/dL  Basic metabolic panel  Result Value Ref Range   Sodium 140 135 - 145 mmol/L   Potassium 4.0 3.5 - 5.1 mmol/L   Chloride 100 98 - 111 mmol/L   CO2 30 22 - 32 mmol/L   Glucose, Bld 244 (H) 70 - 99 mg/dL   BUN 87 (H) 8 - 23 mg/dL   Creatinine, Ser 1.55 (H) 0.44 - 1.00 mg/dL   Calcium 8.9 8.9 - 10.3 mg/dL   GFR, Estimated 35 (L) >60 mL/min   Anion gap 10 5 - 15  Glucose, capillary  Result Value Ref Range   Glucose-Capillary 252 (H) 70 - 99 mg/dL  Glucose, capillary  Result Value Ref Range   Glucose-Capillary 212 (H) 70 - 99 mg/dL  Brain natriuretic peptide  Result Value Ref Range   B Natriuretic Peptide 184.5 (H) 0.0 - 100.0 pg/mL  CBC  Result Value Ref Range   WBC 13.1 (H) 4.0 - 10.5 K/uL   RBC 4.09 3.87 - 5.11 MIL/uL   Hemoglobin 11.2 (L) 12.0 - 15.0 g/dL   HCT 32.8 (L) 36.0 - 46.0 %   MCV 80.2 80.0 - 100.0 fL   MCH 27.4 26.0 - 34.0 pg   MCHC 34.1 30.0 - 36.0 g/dL   RDW 17.2 (H) 11.5 - 15.5 %   Platelets 244 150 - 400 K/uL   nRBC 2.3 (H) 0.0 - 0.2 %  Protime-INR  Result Value Ref Range   Prothrombin Time 17.2 (H) 11.4 - 15.2 seconds   INR 1.4 (H) 0.8 - 1.2  Magnesium  Result Value Ref Range   Magnesium 2.1 1.7 - 2.4 mg/dL  Phosphorus  Result Value Ref Range   Phosphorus 3.7 2.5 - 4.6 mg/dL  Hemoglobin and hematocrit, blood  Result Value Ref Range   Hemoglobin 10.3 (L) 12.0 - 15.0 g/dL   HCT 29.9 (L) 36.0 - 46.0 %  Hemoglobin and hematocrit, blood  Result Value Ref Range   Hemoglobin 10.3 (L) 12.0 - 15.0 g/dL   HCT 31.4 (L) 36.0 - 46.0 %  Hemoglobin and hematocrit, blood  Result Value Ref Range   Hemoglobin 9.9 (L) 12.0 - 15.0 g/dL   HCT 29.4 (L) 36.0 - 46.0 %  Glucose, capillary  Result Value Ref Range   Glucose-Capillary 222 (H) 70 - 99 mg/dL  ESR  Result Value Ref Range   Sed Rate 2 0 - 30  mm/hr  C-reactive protein  Result Value Ref Range   CRP 0.7 <1.0 mg/dL  ANA w/Reflex if Positive  Result Value Ref Range   Anti Nuclear Antibody (ANA) Negative Negative  Glucose, capillary  Result Value Ref Range   Glucose-Capillary 177 (H) 70 - 99 mg/dL  Glucose, capillary  Result Value Ref Range   Glucose-Capillary 184 (H) 70 - 99 mg/dL  Body fluid cell count with differential  Result Value Ref Range   Fluid Type-FCT PERICARDIAL    Color, Fluid RED (A) YELLOW   Appearance, Fluid CLOUDY (A) CLEAR   Total Nucleated Cell Count, Fluid 1,520 cu mm   Neutrophil Count, Fluid 9 %   Lymphs, Fluid 55 %   Monocyte-Macrophage-Serous Fluid 30 %   Eos, Fluid 0 %   Other Cells, Fluid 6 %  Comprehensive metabolic panel  Result Value Ref Range   Sodium 143 135 - 145 mmol/L   Potassium 3.4 (L) 3.5 - 5.1 mmol/L   Chloride 104 98 - 111 mmol/L   CO2 28 22 - 32 mmol/L   Glucose, Bld 148 (H) 70 - 99 mg/dL   BUN 67 (H) 8 - 23 mg/dL   Creatinine, Ser 1.43 (H) 0.44 - 1.00 mg/dL   Calcium 9.3 8.9 - 10.3 mg/dL   Total Protein 5.3 (L) 6.5 - 8.1 g/dL   Albumin 3.6 3.5 - 5.0 g/dL   AST 21 15 - 41 U/L   ALT 15 0 - 44 U/L   Alkaline Phosphatase 40 38 - 126 U/L   Total Bilirubin 2.6 (H) 0.3 - 1.2 mg/dL   GFR, Estimated 38 (L) >60 mL/min   Anion gap 11 5 - 15  CBC with Differential/Platelet  Result Value Ref Range   WBC 14.6 (H) 4.0 - 10.5 K/uL   RBC 3.70 (L) 3.87 - 5.11 MIL/uL   Hemoglobin 10.3 (L) 12.0 - 15.0 g/dL   HCT 31.9 (L) 36.0 - 46.0 %   MCV 86.2 80.0 - 100.0 fL   MCH 27.8 26.0 - 34.0 pg   MCHC 32.3 30.0 - 36.0 g/dL   RDW 19.8 (H) 11.5 - 15.5 %   Platelets 227 150 - 400 K/uL   nRBC 1.7 (H) 0.0 - 0.2 %   Neutrophils Relative % 74 %   Neutro Abs 10.8 (H) 1.7 - 7.7 K/uL   Lymphocytes Relative 9 %   Lymphs Abs 1.3 0.7 - 4.0 K/uL   Monocytes Relative 12 %   Monocytes Absolute 1.7 (H) 0.1 - 1.0 K/uL   Eosinophils Relative 3 %   Eosinophils Absolute 0.5 0.0 - 0.5 K/uL   Basophils  Relative 0 %   Basophils Absolute 0.0 0.0 - 0.1 K/uL   Immature Granulocytes 2 %   Abs Immature Granulocytes 0.25 (H) 0.00 - 0.07 K/uL  Magnesium  Result Value Ref Range   Magnesium 2.2 1.7 - 2.4 mg/dL  Phosphorus  Result Value Ref Range   Phosphorus 3.5 2.5 - 4.6 mg/dL  Pathologist smear review  Result Value Ref Range   Path Review Cytospin of pericardial fluid is reviewed.   Glucose, capillary  Result Value Ref Range   Glucose-Capillary 157 (H) 70 - 99 mg/dL  Glucose, capillary  Result Value Ref Range   Glucose-Capillary 131 (H) 70 - 99 mg/dL  Glucose, capillary  Result Value Ref Range   Glucose-Capillary 149 (H) 70 - 99 mg/dL  Glucose, capillary  Result Value  Ref Range   Glucose-Capillary 217 (H) 70 - 99 mg/dL   Comment 1 Notify RN    Comment 2 Document in Chart   Glucose, capillary  Result Value Ref Range   Glucose-Capillary 207 (H) 70 - 99 mg/dL  Basic metabolic panel  Result Value Ref Range   Sodium 144 135 - 145 mmol/L   Potassium 3.4 (L) 3.5 - 5.1 mmol/L   Chloride 104 98 - 111 mmol/L   CO2 33 (H) 22 - 32 mmol/L   Glucose, Bld 147 (H) 70 - 99 mg/dL   BUN 51 (H) 8 - 23 mg/dL   Creatinine, Ser 1.30 (H) 0.44 - 1.00 mg/dL   Calcium 9.2 8.9 - 10.3 mg/dL   GFR, Estimated 43 (L) >60 mL/min   Anion gap 7 5 - 15  CBC  Result Value Ref Range   WBC 13.4 (H) 4.0 - 10.5 K/uL   RBC 3.36 (L) 3.87 - 5.11 MIL/uL   Hemoglobin 9.4 (L) 12.0 - 15.0 g/dL   HCT 29.1 (L) 36.0 - 46.0 %   MCV 86.6 80.0 - 100.0 fL   MCH 28.0 26.0 - 34.0 pg   MCHC 32.3 30.0 - 36.0 g/dL   RDW 21.2 (H) 11.5 - 15.5 %   Platelets 205 150 - 400 K/uL   nRBC 1.9 (H) 0.0 - 0.2 %  Magnesium  Result Value Ref Range   Magnesium 2.1 1.7 - 2.4 mg/dL  Phosphorus  Result Value Ref Range   Phosphorus 3.1 2.5 - 4.6 mg/dL  Glucose, capillary  Result Value Ref Range   Glucose-Capillary 161 (H) 70 - 99 mg/dL  Hemoglobin and hematocrit, blood  Result Value Ref Range   Hemoglobin 9.3 (L) 12.0 - 15.0 g/dL    HCT 29.5 (L) 36.0 - 46.0 %  Glucose, capillary  Result Value Ref Range   Glucose-Capillary 157 (H) 70 - 99 mg/dL  Glucose, capillary  Result Value Ref Range   Glucose-Capillary 176 (H) 70 - 99 mg/dL  Glucose, capillary  Result Value Ref Range   Glucose-Capillary 184 (H) 70 - 99 mg/dL  CBC  Result Value Ref Range   WBC 10.5 4.0 - 10.5 K/uL   RBC 3.42 (L) 3.87 - 5.11 MIL/uL   Hemoglobin 9.3 (L) 12.0 - 15.0 g/dL   HCT 30.0 (L) 36.0 - 46.0 %   MCV 87.7 80.0 - 100.0 fL   MCH 27.2 26.0 - 34.0 pg   MCHC 31.0 30.0 - 36.0 g/dL   RDW 23.4 (H) 11.5 - 15.5 %   Platelets 192 150 - 400 K/uL   nRBC 0.6 (H) 0.0 - 0.2 %  Basic metabolic panel  Result Value Ref Range   Sodium 141 135 - 145 mmol/L   Potassium 3.6 3.5 - 5.1 mmol/L   Chloride 102 98 - 111 mmol/L   CO2 30 22 - 32 mmol/L   Glucose, Bld 158 (H) 70 - 99 mg/dL   BUN 37 (H) 8 - 23 mg/dL   Creatinine, Ser 1.33 (H) 0.44 - 1.00 mg/dL   Calcium 8.9 8.9 - 10.3 mg/dL   GFR, Estimated 42 (L) >60 mL/min   Anion gap 9 5 - 15  Glucose, capillary  Result Value Ref Range   Glucose-Capillary 160 (H) 70 - 99 mg/dL  Glucose, capillary  Result Value Ref Range   Glucose-Capillary 164 (H) 70 - 99 mg/dL  Glucose, capillary  Result Value Ref Range   Glucose-Capillary 185 (H) 70 - 99 mg/dL  Glucose, capillary  Result Value Ref Range   Glucose-Capillary 187 (H) 70 - 99 mg/dL  Glucose, capillary  Result Value Ref Range   Glucose-Capillary 161 (H) 70 - 99 mg/dL  CBC with Differential/Platelet  Result Value Ref Range   WBC 8.2 4.0 - 10.5 K/uL   RBC 3.50 (L) 3.87 - 5.11 MIL/uL   Hemoglobin 9.7 (L) 12.0 - 15.0 g/dL   HCT 30.9 (L) 36.0 - 46.0 %   MCV 88.3 80.0 - 100.0 fL   MCH 27.7 26.0 - 34.0 pg   MCHC 31.4 30.0 - 36.0 g/dL   RDW 24.9 (H) 11.5 - 15.5 %   Platelets 186 150 - 400 K/uL   nRBC 0.2 0.0 - 0.2 %   Neutrophils Relative % 74 %   Neutro Abs 6.2 1.7 - 7.7 K/uL   Lymphocytes Relative 10 %   Lymphs Abs 0.8 0.7 - 4.0 K/uL   Monocytes  Relative 10 %   Monocytes Absolute 0.8 0.1 - 1.0 K/uL   Eosinophils Relative 4 %   Eosinophils Absolute 0.4 0.0 - 0.5 K/uL   Basophils Relative 0 %   Basophils Absolute 0.0 0.0 - 0.1 K/uL   WBC Morphology MORPHOLOGY UNREMARKABLE    Smear Review Normal platelet morphology    Immature Granulocytes 2 %   Abs Immature Granulocytes 0.13 (H) 0.00 - 0.07 K/uL   Polychromasia PRESENT    Ovalocytes PRESENT   Basic metabolic panel  Result Value Ref Range   Sodium 141 135 - 145 mmol/L   Potassium 3.3 (L) 3.5 - 5.1 mmol/L   Chloride 101 98 - 111 mmol/L   CO2 34 (H) 22 - 32 mmol/L   Glucose, Bld 146 (H) 70 - 99 mg/dL   BUN 35 (H) 8 - 23 mg/dL   Creatinine, Ser 1.39 (H) 0.44 - 1.00 mg/dL   Calcium 8.9 8.9 - 10.3 mg/dL   GFR, Estimated 40 (L) >60 mL/min   Anion gap 6 5 - 15  Magnesium  Result Value Ref Range   Magnesium 2.0 1.7 - 2.4 mg/dL  Phosphorus  Result Value Ref Range   Phosphorus 2.3 (L) 2.5 - 4.6 mg/dL  Glucose, capillary  Result Value Ref Range   Glucose-Capillary 200 (H) 70 - 99 mg/dL  Glucose, capillary  Result Value Ref Range   Glucose-Capillary 130 (H) 70 - 99 mg/dL  Glucose, capillary  Result Value Ref Range   Glucose-Capillary 177 (H) 70 - 99 mg/dL  Glucose, capillary  Result Value Ref Range   Glucose-Capillary 189 (H) 70 - 99 mg/dL  CBC with Differential/Platelet  Result Value Ref Range   WBC 8.1 4.0 - 10.5 K/uL   RBC 3.39 (L) 3.87 - 5.11 MIL/uL   Hemoglobin 9.7 (L) 12.0 - 15.0 g/dL   HCT 30.4 (L) 36.0 - 46.0 %   MCV 89.7 80.0 - 100.0 fL   MCH 28.6 26.0 - 34.0 pg   MCHC 31.9 30.0 - 36.0 g/dL   RDW 25.8 (H) 11.5 - 15.5 %   Platelets 196 150 - 400 K/uL   nRBC 0.4 (H) 0.0 - 0.2 %   Neutrophils Relative % 77 %   Neutro Abs 6.2 1.7 - 7.7 K/uL   Lymphocytes Relative 7 %   Lymphs Abs 0.6 (L) 0.7 - 4.0 K/uL   Monocytes Relative 10 %   Monocytes Absolute 0.8 0.1 - 1.0 K/uL   Eosinophils Relative 5 %   Eosinophils Absolute 0.4 0.0 - 0.5 K/uL   Basophils Relative  0 %  Basophils Absolute 0.0 0.0 - 0.1 K/uL   WBC Morphology MORPHOLOGY UNREMARKABLE    RBC Morphology See Note    Smear Review Normal platelet morphology    Immature Granulocytes 1 %   Abs Immature Granulocytes 0.10 (H) 0.00 - 0.07 K/uL  Basic metabolic panel  Result Value Ref Range   Sodium 144 135 - 145 mmol/L   Potassium 3.3 (L) 3.5 - 5.1 mmol/L   Chloride 101 98 - 111 mmol/L   CO2 32 22 - 32 mmol/L   Glucose, Bld 151 (H) 70 - 99 mg/dL   BUN 32 (H) 8 - 23 mg/dL   Creatinine, Ser 1.47 (H) 0.44 - 1.00 mg/dL   Calcium 8.7 (L) 8.9 - 10.3 mg/dL   GFR, Estimated 37 (L) >60 mL/min   Anion gap 11 5 - 15  Magnesium  Result Value Ref Range   Magnesium 1.9 1.7 - 2.4 mg/dL  Phosphorus  Result Value Ref Range   Phosphorus 2.0 (L) 2.5 - 4.6 mg/dL  Glucose, capillary  Result Value Ref Range   Glucose-Capillary 180 (H) 70 - 99 mg/dL  Glucose, capillary  Result Value Ref Range   Glucose-Capillary 143 (H) 70 - 99 mg/dL  Glucose, capillary  Result Value Ref Range   Glucose-Capillary 209 (H) 70 - 99 mg/dL  APTT  Result Value Ref Range   aPTT 200 (HH) 24 - 36 seconds  Protime-INR  Result Value Ref Range   Prothrombin Time 17.5 (H) 11.4 - 15.2 seconds   INR 1.4 (H) 0.8 - 1.2  Heparin level (unfractionated)  Result Value Ref Range   Heparin Unfractionated 0.37 0.30 - 0.70 IU/mL  APTT  Result Value Ref Range   aPTT 93 (H) 24 - 36 seconds  APTT  Result Value Ref Range   aPTT 83 (H) 24 - 36 seconds  Glucose, capillary  Result Value Ref Range   Glucose-Capillary 214 (H) 70 - 99 mg/dL  CBC with Differential/Platelet  Result Value Ref Range   WBC 9.0 4.0 - 10.5 K/uL   RBC 3.43 (L) 3.87 - 5.11 MIL/uL   Hemoglobin 9.9 (L) 12.0 - 15.0 g/dL   HCT 31.3 (L) 36.0 - 46.0 %   MCV 91.3 80.0 - 100.0 fL   MCH 28.9 26.0 - 34.0 pg   MCHC 31.6 30.0 - 36.0 g/dL   RDW 26.1 (H) 11.5 - 15.5 %   Platelets 214 150 - 400 K/uL   nRBC 0.3 (H) 0.0 - 0.2 %   Neutrophils Relative % 75 %   Neutro Abs  6.8 1.7 - 7.7 K/uL   Lymphocytes Relative 10 %   Lymphs Abs 0.9 0.7 - 4.0 K/uL   Monocytes Relative 9 %   Monocytes Absolute 0.8 0.1 - 1.0 K/uL   Eosinophils Relative 4 %   Eosinophils Absolute 0.4 0.0 - 0.5 K/uL   Basophils Relative 1 %   Basophils Absolute 0.1 0.0 - 0.1 K/uL   WBC Morphology MORPHOLOGY UNREMARKABLE    RBC Morphology MIXED RBC POPULATION    Smear Review Normal platelet morphology    Immature Granulocytes 1 %   Abs Immature Granulocytes 0.10 (H) 0.00 - 0.07 K/uL   Polychromasia PRESENT   Basic metabolic panel  Result Value Ref Range   Sodium 142 135 - 145 mmol/L   Potassium 3.3 (L) 3.5 - 5.1 mmol/L   Chloride 100 98 - 111 mmol/L   CO2 35 (H) 22 - 32 mmol/L   Glucose, Bld 155 (H) 70 - 99  mg/dL   BUN 34 (H) 8 - 23 mg/dL   Creatinine, Ser 1.34 (H) 0.44 - 1.00 mg/dL   Calcium 8.8 (L) 8.9 - 10.3 mg/dL   GFR, Estimated 41 (L) >60 mL/min   Anion gap 7 5 - 15  Magnesium  Result Value Ref Range   Magnesium 2.0 1.7 - 2.4 mg/dL  Phosphorus  Result Value Ref Range   Phosphorus 2.2 (L) 2.5 - 4.6 mg/dL  Heparin level (unfractionated)  Result Value Ref Range   Heparin Unfractionated 0.42 0.30 - 0.70 IU/mL  Glucose, capillary  Result Value Ref Range   Glucose-Capillary 213 (H) 70 - 99 mg/dL  Glucose, capillary  Result Value Ref Range   Glucose-Capillary 135 (H) 70 - 99 mg/dL  Glucose, capillary  Result Value Ref Range   Glucose-Capillary 178 (H) 70 - 99 mg/dL  Glucose, capillary  Result Value Ref Range   Glucose-Capillary 159 (H) 70 - 99 mg/dL  Magnesium  Result Value Ref Range   Magnesium 2.0 1.7 - 2.4 mg/dL  Phosphorus  Result Value Ref Range   Phosphorus 2.4 (L) 2.5 - 4.6 mg/dL  Heparin level (unfractionated)  Result Value Ref Range   Heparin Unfractionated 0.41 0.30 - 0.70 IU/mL  CBC  Result Value Ref Range   WBC 8.2 4.0 - 10.5 K/uL   RBC 3.39 (L) 3.87 - 5.11 MIL/uL   Hemoglobin 9.6 (L) 12.0 - 15.0 g/dL   HCT 30.8 (L) 36.0 - 46.0 %   MCV 90.9 80.0 -  100.0 fL   MCH 28.3 26.0 - 34.0 pg   MCHC 31.2 30.0 - 36.0 g/dL   RDW 26.1 (H) 11.5 - 15.5 %   Platelets 229 150 - 400 K/uL   nRBC 0.0 0.0 - 0.2 %  Glucose, capillary  Result Value Ref Range   Glucose-Capillary 150 (H) 70 - 99 mg/dL  Glucose, capillary  Result Value Ref Range   Glucose-Capillary 133 (H) 70 - 99 mg/dL  Glucose, capillary  Result Value Ref Range   Glucose-Capillary 131 (H) 70 - 99 mg/dL  Basic metabolic panel  Result Value Ref Range   Sodium 141 135 - 145 mmol/L   Potassium 4.7 3.5 - 5.1 mmol/L   Chloride 99 98 - 111 mmol/L   CO2 27 22 - 32 mmol/L   Glucose, Bld 155 (H) 70 - 99 mg/dL   BUN 34 (H) 8 - 23 mg/dL   Creatinine, Ser 1.43 (H) 0.44 - 1.00 mg/dL   Calcium 8.9 8.9 - 10.3 mg/dL   GFR, Estimated 38 (L) >60 mL/min   Anion gap 15 5 - 15  Glucose, capillary  Result Value Ref Range   Glucose-Capillary 153 (H) 70 - 99 mg/dL  Magnesium  Result Value Ref Range   Magnesium 2.1 1.7 - 2.4 mg/dL  Phosphorus  Result Value Ref Range   Phosphorus 2.3 (L) 2.5 - 4.6 mg/dL  CBC  Result Value Ref Range   WBC 8.0 4.0 - 10.5 K/uL   RBC 3.59 (L) 3.87 - 5.11 MIL/uL   Hemoglobin 9.8 (L) 12.0 - 15.0 g/dL   HCT 32.3 (L) 36.0 - 46.0 %   MCV 90.0 80.0 - 100.0 fL   MCH 27.3 26.0 - 34.0 pg   MCHC 30.3 30.0 - 36.0 g/dL   RDW 26.2 (H) 11.5 - 15.5 %   Platelets 264 150 - 400 K/uL   nRBC 0.0 0.0 - 0.2 %  Glucose, capillary  Result Value Ref Range   Glucose-Capillary 118 (H)  70 - 99 mg/dL  Glucose, capillary  Result Value Ref Range   Glucose-Capillary 149 (H) 70 - 99 mg/dL  Renal function panel  Result Value Ref Range   Sodium 138 135 - 145 mmol/L   Potassium 4.2 3.5 - 5.1 mmol/L   Chloride 99 98 - 111 mmol/L   CO2 27 22 - 32 mmol/L   Glucose, Bld 140 (H) 70 - 99 mg/dL   BUN 34 (H) 8 - 23 mg/dL   Creatinine, Ser 1.51 (H) 0.44 - 1.00 mg/dL   Calcium 9.2 8.9 - 10.3 mg/dL   Phosphorus 2.3 (L) 2.5 - 4.6 mg/dL   Albumin 3.9 3.5 - 5.0 g/dL   GFR, Estimated 36 (L) >60  mL/min   Anion gap 12 5 - 15  Glucose, capillary  Result Value Ref Range   Glucose-Capillary 145 (H) 70 - 99 mg/dL  Magnesium  Result Value Ref Range   Magnesium 2.2 1.7 - 2.4 mg/dL  Phosphorus  Result Value Ref Range   Phosphorus 2.6 2.5 - 4.6 mg/dL  Glucose, capillary  Result Value Ref Range   Glucose-Capillary 140 (H) 70 - 99 mg/dL  Glucose, capillary  Result Value Ref Range   Glucose-Capillary 244 (H) 70 - 99 mg/dL  Glucose, capillary  Result Value Ref Range   Glucose-Capillary 138 (H) 70 - 99 mg/dL  Basic metabolic panel  Result Value Ref Range   Sodium 139 135 - 145 mmol/L   Potassium 4.0 3.5 - 5.1 mmol/L   Chloride 99 98 - 111 mmol/L   CO2 27 22 - 32 mmol/L   Glucose, Bld 145 (H) 70 - 99 mg/dL   BUN 35 (H) 8 - 23 mg/dL   Creatinine, Ser 1.55 (H) 0.44 - 1.00 mg/dL   Calcium 9.3 8.9 - 10.3 mg/dL   GFR, Estimated 35 (L) >60 mL/min   Anion gap 13 5 - 15  CBC  Result Value Ref Range   WBC 8.6 4.0 - 10.5 K/uL   RBC 3.54 (L) 3.87 - 5.11 MIL/uL   Hemoglobin 9.8 (L) 12.0 - 15.0 g/dL   HCT 32.4 (L) 36.0 - 46.0 %   MCV 91.5 80.0 - 100.0 fL   MCH 27.7 26.0 - 34.0 pg   MCHC 30.2 30.0 - 36.0 g/dL   RDW 26.8 (H) 11.5 - 15.5 %   Platelets 272 150 - 400 K/uL   nRBC 0.0 0.0 - 0.2 %  Glucose, capillary  Result Value Ref Range   Glucose-Capillary 190 (H) 70 - 99 mg/dL  Glucose, capillary  Result Value Ref Range   Glucose-Capillary 160 (H) 70 - 99 mg/dL  Magnesium  Result Value Ref Range   Magnesium 2.2 1.7 - 2.4 mg/dL  Phosphorus  Result Value Ref Range   Phosphorus 2.7 2.5 - 4.6 mg/dL  Glucose, capillary  Result Value Ref Range   Glucose-Capillary 138 (H) 70 - 99 mg/dL  Glucose, capillary  Result Value Ref Range   Glucose-Capillary 118 (H) 70 - 99 mg/dL  Basic metabolic panel  Result Value Ref Range   Sodium 135 135 - 145 mmol/L   Potassium 3.3 (L) 3.5 - 5.1 mmol/L   Chloride 96 (L) 98 - 111 mmol/L   CO2 30 22 - 32 mmol/L   Glucose, Bld 145 (H) 70 - 99 mg/dL    BUN 33 (H) 8 - 23 mg/dL   Creatinine, Ser 1.79 (H) 0.44 - 1.00 mg/dL   Calcium 9.0 8.9 - 10.3 mg/dL   GFR, Estimated 29 (  L) >60 mL/min   Anion gap 9 5 - 15  Comprehensive metabolic panel  Result Value Ref Range   Sodium 135 135 - 145 mmol/L   Potassium 3.1 (L) 3.5 - 5.1 mmol/L   Chloride 95 (L) 98 - 111 mmol/L   CO2 28 22 - 32 mmol/L   Glucose, Bld 159 (H) 70 - 99 mg/dL   BUN 31 (H) 8 - 23 mg/dL   Creatinine, Ser 1.72 (H) 0.44 - 1.00 mg/dL   Calcium 9.2 8.9 - 10.3 mg/dL   Total Protein 6.5 6.5 - 8.1 g/dL   Albumin 4.0 3.5 - 5.0 g/dL   AST 27 15 - 41 U/L   ALT 17 0 - 44 U/L   Alkaline Phosphatase 73 38 - 126 U/L   Total Bilirubin 2.7 (H) 0.3 - 1.2 mg/dL   GFR, Estimated 31 (L) >60 mL/min   Anion gap 12 5 - 15  CBC  Result Value Ref Range   WBC 6.6 4.0 - 10.5 K/uL   RBC 3.70 (L) 3.87 - 5.11 MIL/uL   Hemoglobin 10.4 (L) 12.0 - 15.0 g/dL   HCT 33.1 (L) 36.0 - 46.0 %   MCV 89.5 80.0 - 100.0 fL   MCH 28.1 26.0 - 34.0 pg   MCHC 31.4 30.0 - 36.0 g/dL   RDW 26.4 (H) 11.5 - 15.5 %   Platelets 287 150 - 400 K/uL   nRBC 0.0 0.0 - 0.2 %  Glucose, capillary  Result Value Ref Range   Glucose-Capillary 151 (H) 70 - 99 mg/dL  Magnesium  Result Value Ref Range   Magnesium 2.1 1.7 - 2.4 mg/dL  Glucose, capillary  Result Value Ref Range   Glucose-Capillary 174 (H) 70 - 99 mg/dL  Magnesium  Result Value Ref Range   Magnesium 2.0 1.7 - 2.4 mg/dL  Phosphorus  Result Value Ref Range   Phosphorus 3.3 2.5 - 4.6 mg/dL  Comprehensive metabolic panel  Result Value Ref Range   Sodium 134 (L) 135 - 145 mmol/L   Potassium 3.4 (L) 3.5 - 5.1 mmol/L   Chloride 95 (L) 98 - 111 mmol/L   CO2 28 22 - 32 mmol/L   Glucose, Bld 206 (H) 70 - 99 mg/dL   BUN 40 (H) 8 - 23 mg/dL   Creatinine, Ser 2.01 (H) 0.44 - 1.00 mg/dL   Calcium 9.1 8.9 - 10.3 mg/dL   Total Protein 5.9 (L) 6.5 - 8.1 g/dL   Albumin 3.8 3.5 - 5.0 g/dL   AST 28 15 - 41 U/L   ALT 18 0 - 44 U/L   Alkaline Phosphatase 70 38 - 126 U/L    Total Bilirubin 2.0 (H) 0.3 - 1.2 mg/dL   GFR, Estimated 25 (L) >60 mL/min   Anion gap 11 5 - 15  CBC  Result Value Ref Range   WBC 6.7 4.0 - 10.5 K/uL   RBC 3.56 (L) 3.87 - 5.11 MIL/uL   Hemoglobin 10.0 (L) 12.0 - 15.0 g/dL   HCT 32.3 (L) 36.0 - 46.0 %   MCV 90.7 80.0 - 100.0 fL   MCH 28.1 26.0 - 34.0 pg   MCHC 31.0 30.0 - 36.0 g/dL   RDW 26.5 (H) 11.5 - 15.5 %   Platelets 298 150 - 400 K/uL   nRBC 0.0 0.0 - 0.2 %  Glucose, capillary  Result Value Ref Range   Glucose-Capillary 158 (H) 70 - 99 mg/dL  Glucose, capillary  Result Value Ref Range   Glucose-Capillary 133 (  H) 70 - 99 mg/dL  Lactate dehydrogenase (pleural or peritoneal fluid)  Result Value Ref Range   LD, Fluid 46 (H) 3 - 23 U/L   Fluid Type-FLDH PLEURAL   Body fluid cell count with differential  Result Value Ref Range   Fluid Type-FCT PLEURAL    Color, Fluid YELLOW (A) YELLOW   Appearance, Fluid CLEAR CLEAR   Total Nucleated Cell Count, Fluid 717 cu mm   Neutrophil Count, Fluid 45 %   Lymphs, Fluid 50 %   Monocyte-Macrophage-Serous Fluid 5 %   Eos, Fluid 0 %  Amylase, pleural or peritoneal fluid     Result Value Ref Range   Amylase, Fluid 23 U/L   Fluid Type-FAMY PLEURAL   Albumin, pleural or peritoneal fluid   Result Value Ref Range   Albumin, Fluid <1.5 g/dL   Fluid Type-FALB PLEURAL   Protein, pleural or peritoneal fluid  Result Value Ref Range   Total protein, fluid <3.0 g/dL   Fluid Type-FTP PLEURAL   Glucose, pleural or peritoneal fluid  Result Value Ref Range   Glucose, Fluid 148 mg/dL   Fluid Type-FGLU PLEURAL   Glucose, capillary  Result Value Ref Range   Glucose-Capillary 117 (H) 70 - 99 mg/dL  Protein, body fluid (other)  Result Value Ref Range   Total Protein, Body Fluid Other 1.9 g/dL   Source of Sample <3.0   Glucose, capillary  Result Value Ref Range   Glucose-Capillary 112 (H) 70 - 99 mg/dL  Magnesium  Result Value Ref Range   Magnesium 1.8 1.7 - 2.4 mg/dL  Phosphorus   Result Value Ref Range   Phosphorus 2.9 2.5 - 4.6 mg/dL  Comprehensive metabolic panel  Result Value Ref Range   Sodium 135 135 - 145 mmol/L   Potassium 2.8 (L) 3.5 - 5.1 mmol/L   Chloride 90 (L) 98 - 111 mmol/L   CO2 31 22 - 32 mmol/L   Glucose, Bld 206 (H) 70 - 99 mg/dL   BUN 40 (H) 8 - 23 mg/dL   Creatinine, Ser 2.06 (H) 0.44 - 1.00 mg/dL   Calcium 9.5 8.9 - 10.3 mg/dL   Total Protein 6.3 (L) 6.5 - 8.1 g/dL   Albumin 3.8 3.5 - 5.0 g/dL   AST 34 15 - 41 U/L   ALT 21 0 - 44 U/L   Alkaline Phosphatase 77 38 - 126 U/L   Total Bilirubin 2.4 (H) 0.3 - 1.2 mg/dL   GFR, Estimated 25 (L) >60 mL/min   Anion gap 14 5 - 15  CBC  Result Value Ref Range   WBC 6.2 4.0 - 10.5 K/uL   RBC 3.53 (L) 3.87 - 5.11 MIL/uL   Hemoglobin 10.1 (L) 12.0 - 15.0 g/dL   HCT 31.1 (L) 36.0 - 46.0 %   MCV 88.1 80.0 - 100.0 fL   MCH 28.6 26.0 - 34.0 pg   MCHC 32.5 30.0 - 36.0 g/dL   RDW 26.4 (H) 11.5 - 15.5 %   Platelets 369 150 - 400 K/uL   nRBC 0.0 0.0 - 0.2 %  Glucose, capillary  Result Value Ref Range   Glucose-Capillary 151 (H) 70 - 99 mg/dL  Glucose, capillary  Result Value Ref Range   Glucose-Capillary 135 (H) 70 - 99 mg/dL  Glucose, capillary  Result Value Ref Range   Glucose-Capillary 212 (H) 70 - 99 mg/dL  Glucose, capillary  Result Value Ref Range   Glucose-Capillary 158 (H) 70 - 99 mg/dL  Magnesium  Result Value  Ref Range   Magnesium 2.7 (H) 1.7 - 2.4 mg/dL  Phosphorus  Result Value Ref Range   Phosphorus 3.4 2.5 - 4.6 mg/dL  Comprehensive metabolic panel  Result Value Ref Range   Sodium 137 135 - 145 mmol/L   Potassium 3.1 (L) 3.5 - 5.1 mmol/L   Chloride 93 (L) 98 - 111 mmol/L   CO2 34 (H) 22 - 32 mmol/L   Glucose, Bld 161 (H) 70 - 99 mg/dL   BUN 44 (H) 8 - 23 mg/dL   Creatinine, Ser 1.85 (H) 0.44 - 1.00 mg/dL   Calcium 9.4 8.9 - 10.3 mg/dL   Total Protein 6.1 (L) 6.5 - 8.1 g/dL   Albumin 3.8 3.5 - 5.0 g/dL   AST 26 15 - 41 U/L   ALT 19 0 - 44 U/L   Alkaline Phosphatase  74 38 - 126 U/L   Total Bilirubin 2.2 (H) 0.3 - 1.2 mg/dL   GFR, Estimated 28 (L) >60 mL/min   Anion gap 10 5 - 15  CBC  Result Value Ref Range   WBC 5.6 4.0 - 10.5 K/uL   RBC 3.44 (L) 3.87 - 5.11 MIL/uL   Hemoglobin 9.7 (L) 12.0 - 15.0 g/dL   HCT 30.6 (L) 36.0 - 46.0 %   MCV 89.0 80.0 - 100.0 fL   MCH 28.2 26.0 - 34.0 pg   MCHC 31.7 30.0 - 36.0 g/dL   RDW 26.5 (H) 11.5 - 15.5 %   Platelets 341 150 - 400 K/uL   nRBC 0.0 0.0 - 0.2 %  Vitamin B12  Result Value Ref Range   Vitamin B-12 977 (H) 180 - 914 pg/mL  Folate  Result Value Ref Range   Folate 28.0 >5.9 ng/mL  Iron and TIBC  Result Value Ref Range   Iron 35 28 - 170 ug/dL   TIBC 346 250 - 450 ug/dL   Saturation Ratios 10 (L) 10.4 - 31.8 %   UIBC 311 ug/dL  Ferritin  Result Value Ref Range   Ferritin 149 11 - 307 ng/mL  Reticulocytes  Result Value Ref Range   Retic Ct Pct 5.3 (H) 0.4 - 3.1 %   RBC. 3.52 (L) 3.87 - 5.11 MIL/uL   Retic Count, Absolute 186.6 (H) 19.0 - 186.0 K/uL   Immature Retic Fract 26.0 (H) 2.3 - 15.9 %  Glucose, capillary  Result Value Ref Range   Glucose-Capillary 188 (H) 70 - 99 mg/dL  Glucose, capillary  Result Value Ref Range   Glucose-Capillary 154 (H) 70 - 99 mg/dL  Glucose, capillary  Result Value Ref Range   Glucose-Capillary 211 (H) 70 - 99 mg/dL  Glucose, capillary  Result Value Ref Range   Glucose-Capillary 232 (H) 70 - 99 mg/dL  Magnesium  Result Value Ref Range   Magnesium 2.3 1.7 - 2.4 mg/dL  Phosphorus  Result Value Ref Range   Phosphorus 3.0 2.5 - 4.6 mg/dL  Comprehensive metabolic panel  Result Value Ref Range   Sodium 139 135 - 145 mmol/L   Potassium 2.9 (L) 3.5 - 5.1 mmol/L   Chloride 93 (L) 98 - 111 mmol/L   CO2 36 (H) 22 - 32 mmol/L   Glucose, Bld 155 (H) 70 - 99 mg/dL   BUN 41 (H) 8 - 23 mg/dL   Creatinine, Ser 1.94 (H) 0.44 - 1.00 mg/dL   Calcium 9.2 8.9 - 10.3 mg/dL   Total Protein 6.2 (L) 6.5 - 8.1 g/dL   Albumin 3.8 3.5 - 5.0 g/dL  AST 22 15 - 41 U/L    ALT 16 0 - 44 U/L   Alkaline Phosphatase 71 38 - 126 U/L   Total Bilirubin 2.5 (H) 0.3 - 1.2 mg/dL   GFR, Estimated 27 (L) >60 mL/min   Anion gap 10 5 - 15  CBC  Result Value Ref Range   WBC 6.2 4.0 - 10.5 K/uL   RBC 3.33 (L) 3.87 - 5.11 MIL/uL   Hemoglobin 9.6 (L) 12.0 - 15.0 g/dL   HCT 29.6 (L) 36.0 - 46.0 %   MCV 88.9 80.0 - 100.0 fL   MCH 28.8 26.0 - 34.0 pg   MCHC 32.4 30.0 - 36.0 g/dL   RDW 26.3 (H) 11.5 - 15.5 %   Platelets 351 150 - 400 K/uL   nRBC 0.0 0.0 - 0.2 %  Glucose, capillary  Result Value Ref Range   Glucose-Capillary 174 (H) 70 - 99 mg/dL  Glucose, capillary  Result Value Ref Range   Glucose-Capillary 164 (H) 70 - 99 mg/dL  Glucose, capillary  Result Value Ref Range   Glucose-Capillary 220 (H) 70 - 99 mg/dL  Glucose, capillary  Result Value Ref Range   Glucose-Capillary 195 (H) 70 - 99 mg/dL  Magnesium  Result Value Ref Range   Magnesium 2.2 1.7 - 2.4 mg/dL  Phosphorus  Result Value Ref Range   Phosphorus 3.1 2.5 - 4.6 mg/dL  Comprehensive metabolic panel  Result Value Ref Range   Sodium 138 135 - 145 mmol/L   Potassium 3.3 (L) 3.5 - 5.1 mmol/L   Chloride 93 (L) 98 - 111 mmol/L   CO2 35 (H) 22 - 32 mmol/L   Glucose, Bld 174 (H) 70 - 99 mg/dL   BUN 45 (H) 8 - 23 mg/dL   Creatinine, Ser 1.76 (H) 0.44 - 1.00 mg/dL   Calcium 9.3 8.9 - 10.3 mg/dL   Total Protein 6.1 (L) 6.5 - 8.1 g/dL   Albumin 3.8 3.5 - 5.0 g/dL   AST 19 15 - 41 U/L   ALT 16 0 - 44 U/L   Alkaline Phosphatase 71 38 - 126 U/L   Total Bilirubin 2.3 (H) 0.3 - 1.2 mg/dL   GFR, Estimated 30 (L) >60 mL/min   Anion gap 10 5 - 15  CBC  Result Value Ref Range   WBC 5.7 4.0 - 10.5 K/uL   RBC 3.38 (L) 3.87 - 5.11 MIL/uL   Hemoglobin 9.6 (L) 12.0 - 15.0 g/dL   HCT 30.1 (L) 36.0 - 46.0 %   MCV 89.1 80.0 - 100.0 fL   MCH 28.4 26.0 - 34.0 pg   MCHC 31.9 30.0 - 36.0 g/dL   RDW 25.7 (H) 11.5 - 15.5 %   Platelets 356 150 - 400 K/uL   nRBC 0.0 0.0 - 0.2 %  Glucose, capillary  Result Value  Ref Range   Glucose-Capillary 171 (H) 70 - 99 mg/dL  Glucose, capillary  Result Value Ref Range   Glucose-Capillary 158 (H) 70 - 99 mg/dL  Glucose, capillary  Result Value Ref Range   Glucose-Capillary 246 (H) 70 - 99 mg/dL  Glucose, capillary  Result Value Ref Range   Glucose-Capillary 175 (H) 70 - 99 mg/dL  Magnesium  Result Value Ref Range   Magnesium 2.1 1.7 - 2.4 mg/dL  Phosphorus  Result Value Ref Range   Phosphorus 2.7 2.5 - 4.6 mg/dL  Comprehensive metabolic panel  Result Value Ref Range   Sodium 138 135 - 145 mmol/L   Potassium 3.8  3.5 - 5.1 mmol/L   Chloride 96 (L) 98 - 111 mmol/L   CO2 34 (H) 22 - 32 mmol/L   Glucose, Bld 178 (H) 70 - 99 mg/dL   BUN 43 (H) 8 - 23 mg/dL   Creatinine, Ser 1.67 (H) 0.44 - 1.00 mg/dL   Calcium 9.4 8.9 - 10.3 mg/dL   Total Protein 6.1 (L) 6.5 - 8.1 g/dL   Albumin 3.6 3.5 - 5.0 g/dL   AST 20 15 - 41 U/L   ALT 14 0 - 44 U/L   Alkaline Phosphatase 77 38 - 126 U/L   Total Bilirubin 2.0 (H) 0.3 - 1.2 mg/dL   GFR, Estimated 32 (L) >60 mL/min   Anion gap 8 5 - 15  CBC with Differential/Platelet  Result Value Ref Range   WBC 4.5 4.0 - 10.5 K/uL   RBC 3.65 (L) 3.87 - 5.11 MIL/uL   Hemoglobin 9.9 (L) 12.0 - 15.0 g/dL   HCT 32.8 (L) 36.0 - 46.0 %   MCV 89.9 80.0 - 100.0 fL   MCH 27.1 26.0 - 34.0 pg   MCHC 30.2 30.0 - 36.0 g/dL   RDW 25.8 (H) 11.5 - 15.5 %   Platelets 380 150 - 400 K/uL   nRBC 0.0 0.0 - 0.2 %   Neutrophils Relative % 71 %   Neutro Abs 3.2 1.7 - 7.7 K/uL   Lymphocytes Relative 10 %   Lymphs Abs 0.4 (L) 0.7 - 4.0 K/uL   Monocytes Relative 13 %   Monocytes Absolute 0.6 0.1 - 1.0 K/uL   Eosinophils Relative 5 %   Eosinophils Absolute 0.2 0.0 - 0.5 K/uL   Basophils Relative 1 %   Basophils Absolute 0.1 0.0 - 0.1 K/uL   WBC Morphology MORPHOLOGY UNREMARKABLE    Smear Review Normal platelet morphology    Immature Granulocytes 0 %   Abs Immature Granulocytes 0.02 0.00 - 0.07 K/uL   Spherocytes PRESENT    Ovalocytes  PRESENT   Glucose, capillary  Result Value Ref Range   Glucose-Capillary 187 (H) 70 - 99 mg/dL  Glucose, capillary  Result Value Ref Range   Glucose-Capillary 165 (H) 70 - 99 mg/dL  Glucose, capillary  Result Value Ref Range   Glucose-Capillary 219 (H) 70 - 99 mg/dL  Glucose, capillary  Result Value Ref Range   Glucose-Capillary 205 (H) 70 - 99 mg/dL  Comprehensive metabolic panel  Result Value Ref Range   Sodium 139 135 - 145 mmol/L   Potassium 3.5 3.5 - 5.1 mmol/L   Chloride 93 (L) 98 - 111 mmol/L   CO2 36 (H) 22 - 32 mmol/L   Glucose, Bld 172 (H) 70 - 99 mg/dL   BUN 45 (H) 8 - 23 mg/dL   Creatinine, Ser 1.70 (H) 0.44 - 1.00 mg/dL   Calcium 9.6 8.9 - 10.3 mg/dL   Total Protein 6.1 (L) 6.5 - 8.1 g/dL   Albumin 3.6 3.5 - 5.0 g/dL   AST 22 15 - 41 U/L   ALT 15 0 - 44 U/L   Alkaline Phosphatase 78 38 - 126 U/L   Total Bilirubin 2.0 (H) 0.3 - 1.2 mg/dL   GFR, Estimated 31 (L) >60 mL/min   Anion gap 10 5 - 15  CBC with Differential/Platelet  Result Value Ref Range   WBC 4.1 4.0 - 10.5 K/uL   RBC 3.71 (L) 3.87 - 5.11 MIL/uL   Hemoglobin 10.2 (L) 12.0 - 15.0 g/dL   HCT 32.8 (L) 36.0 - 46.0 %  MCV 88.4 80.0 - 100.0 fL   MCH 27.5 26.0 - 34.0 pg   MCHC 31.1 30.0 - 36.0 g/dL   RDW 25.1 (H) 11.5 - 15.5 %   Platelets 365 150 - 400 K/uL   nRBC 0.0 0.0 - 0.2 %   Neutrophils Relative % 66 %   Neutro Abs 2.7 1.7 - 7.7 K/uL   Lymphocytes Relative 13 %   Lymphs Abs 0.6 (L) 0.7 - 4.0 K/uL   Monocytes Relative 15 %   Monocytes Absolute 0.6 0.1 - 1.0 K/uL   Eosinophils Relative 4 %   Eosinophils Absolute 0.2 0.0 - 0.5 K/uL   Basophils Relative 1 %   Basophils Absolute 0.1 0.0 - 0.1 K/uL   RBC Morphology HYPOCHROMIC,ANISOCYTOSIS    Smear Review Normal platelet morphology    Immature Granulocytes 1 %   Abs Immature Granulocytes 0.02 0.00 - 0.07 K/uL   Polychromasia PRESENT   Magnesium  Result Value Ref Range   Magnesium 2.1 1.7 - 2.4 mg/dL  Phosphorus  Result Value Ref Range    Phosphorus 3.1 2.5 - 4.6 mg/dL  Glucose, capillary  Result Value Ref Range   Glucose-Capillary 179 (H) 70 - 99 mg/dL  Glucose, capillary  Result Value Ref Range   Glucose-Capillary 147 (H) 70 - 99 mg/dL   Comment 1 Notify RN    Comment 2 Document in Chart    *Note: Due to a large number of results and/or encounters for the requested time period, some results have not been displayed. A complete set of results can be found in Results Review.      Assessment & Plan:   Problem List Items Addressed This Visit       Cardiovascular and Mediastinum   Acute on chronic diastolic CHF (congestive heart failure) ([place])    Seeing cardiology on Friday. To see heart failure clinic. Continue current regimen. Continue to monitor. Call with any concerns.       Relevant Medications   metoprolol succinate (TOPROL-XL) 25 MG 24 hr tablet   midodrine (PROAMATINE) 10 MG tablet   spironolactone (ALDACTONE) 25 MG tablet   Pericardial effusion    Seeing cardiology on Friday. Will continue to monitor closely as she has needed it drained about every 2 weeks. Referral to pulmonology placed today.       Relevant Medications   metoprolol succinate (TOPROL-XL) 25 MG 24 hr tablet   midodrine (PROAMATINE) 10 MG tablet   spironolactone (ALDACTONE) 25 MG tablet   Pulmonary hypertension, primary ([place])    Continue to follow with cardiology and pulmonology. Call with any concerns. Continue to monitor.       Relevant Medications   metoprolol succinate (TOPROL-XL) 25 MG 24 hr tablet   midodrine (PROAMATINE) 10 MG tablet   spironolactone (ALDACTONE) 25 MG tablet   Other Relevant Orders   Ambulatory referral to Pulmonology     Other   Anasarca - Primary    Following with cardiology and nephrology. Euvolemic today. Continue to monitor. Call with any concerns.       Other Visit Diagnoses     Pleural effusion, left       Relevant Orders   Ambulatory referral to Pulmonology   AKI (acute kidney injury)  ([place])       Rechecking labs today. Await results. Treat as needed.    Iron deficiency anemia due to chronic blood loss       Rechecking labs today. Await results. Treat as needed.    Relevant  Medications   folic acid (FOLVITE) 1 MG tablet   vitamin B-12 (CYANOCOBALAMIN) 500 MCG tablet        Follow up plan: Return in about 4 weeks (around 10/02/2021).  >45 minutes spent with patient and husband today

## 2021-09-07 NOTE — Patient Instructions (Addendum)
Medication Instructions:  Your physician has recommended you make the following change in your medication:   HOLD Metolazone   *If you need a refill on your cardiac medications before your next appointment, please call your pharmacy*   Lab Work: STAT BMET, Go to [facility] entrance and check in at registration desk.   If you have labs (blood work) drawn today and your tests are completely normal, you will receive your results only by: [place] (if you have MyChart) OR A paper copy in the mail If you have any lab test that is abnormal or we need to change your treatment, we will call you to review the results.   Testing/Procedures: Your physician has requested that you have a limited echocardiogram.   Echocardiography is a painless test that uses sound waves to create images of your heart. It provides your doctor with information about the size and shape of your heart and how well your heart's chambers and valves are working. This procedure takes approximately one hour. There are no restrictions for this procedure.    Follow-Up: At [facility], you and your health needs are our priority.  As part of our continuing mission to provide you with exceptional heart care, we have created designated Provider Care Teams.  These Care Teams include your primary Cardiologist (physician) and Advanced Practice Providers (APPs -  Physician Assistants and Nurse Practitioners) who all work together to provide you with the care you need, when you need it.   Your next appointment:   Rescheduled due to conflicts with patients appointments.   Friday September 21, 2021 at 2:30 pm with Ignacia Bayley NP  The format for your next appointment:   In Person  Provider:   Murray Hodgkins, NP

## 2021-09-07 NOTE — Progress Notes (Signed)
Office Visit    Patient Name: Karen Farley Date of Encounter: 09/07/2021  Primary Care Provider:  Valerie Roys, DO Primary Cardiologist:  Ida Rogue, MD  Chief Complaint    75 year old female with a history of HFpEF, hypertension, stage III chronic kidney disease, lymphedema, permanent atrial fibrillation, hyperlipidemia, and diabetes, who presents for follow-up after recent admission with hypotension, left pleural effusion requiring thoracentesis, large pericardial effusion requiring pericardiocentesis, GI bleed, and volume overload.  Past Medical History    Past Medical History:  Diagnosis Date   Chronic heart failure with preserved ejection fraction (HFpEF) ([place])    a. 11/2019 Echo: EF 60-65%; b. 07/2021 Echo: EF 60-65%.   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 07/2021 req 2u prbcs. Eval deferred given resolution of bleeding and cardiac issues.   Hyperlipidemia    Hypertension    Lymphedema    Morbid obesity ([place])    Osteoarthritis of both knees    Osteopenia    Pericardial effusion    a. 07/2021 Echo: Nl EF. Large pericardial effusion-->s/p pericardiocentesis of 744ml; b. 08/23/2021 Echo: EF 60-65%, mild LVH, Nl RV fxn, sev dil LA, moderate pericardial effusion. L pleural effusion.   Permanent atrial fibrillation (HCC)    Pleural effusion, left    a. 07/2021 s/p thoracentesis - 681ml.   Past Surgical History:  Procedure Laterality Date   CHOLECYSTECTOMY     DILATION AND CURETTAGE OF UTERUS     IR FLUORO GUIDE CV LINE RIGHT  08/16/2021   IR THORACENTESIS ASP PLEURAL SPACE W/IMG GUIDE  08/16/2021   PERICARDIOCENTESIS N/A 08/05/2021   Procedure: PERICARDIOCENTESIS;  Surgeon: Isaias Cowman, MD;  Location: [place] [facility];  Service:  Cardiovascular;  Laterality: N/A;   TEAR DUCT PROBING WITH STRABISMUS REPAIR Right    TONSILLECTOMY      Allergies  No Known Allergies  History of Present Illness    75 year old female with above past medical history including HFpEF, hypertension, stage III chronic kidney disease, permanent atrial fibrillation, hyperlipidemia, lymphedema, and diabetes.  She presented to [facility] in early October with worsening dyspnea, orthopnea, and 5 pound weight gain.  She was noted to have a large left pleural effusion underwent thoracentesis with removal of 600 mL.  She was placed on IV diuretics but did not initially diuresed well and was subsequently placed on a Lasix drip.  On October 15, she was noted to have a rectal bleed.  Eliquis was discontinued.  She required 2 units of packed red blood cells and was transferred to the ICU.  CT angiogram showed focal area of extravasation and incidentally showed a large pericardial effusion.  Transthoracic echo confirmed a large pericardial effusion without tamponade.  She underwent pericardiocentesis of 700 mL.  Follow-up limited echocardiograms showed hemodynamic stability and ongoing moderate pericardial effusion.  In the setting of anasarca, she required ongoing diuresis via Lasix infusion and this was subsequently switched to torsemide 40 mg daily and metolazone 2.5 mg daily, at discharge.  Eliquis was able to be initiated prior to discharge as H&H were stable and she had no additional bleeding.  GI evaluation was deferred given no residual bleeding and stable H&H.  She was discharged home November 6 with a BUN of 51 and creatinine  of 1.87.  She followed up with nephrology earlier this week.  Labs at that time showed stable H&H of 12.9 and 42.1.  Her BUN was up to 70 with a creatinine of 2.37.  Potassium was low at 3.4.  She was contacted by the nephrology office on November 15, and patient was given directions related to metolazone.  I reached out to the nephrology  office, and she was advised to reduce metolazone to 2.5 mg 3 times per week however, patient understood directions as increasing metolazone to 2.5 mg 3 times per day.  She has been doing this since November 15.  Today, her weight is down 13 pounds since discharge.  She feels weak but has not been experiencing any dyspnea or edema.  Activity at this point is quite limited.  She denies palpitations, chest pain, PND, orthopnea, dizziness, syncope, or early satiety.  Home Medications    Current Outpatient Medications  Medication Sig Dispense Refill   apixaban (ELIQUIS) 5 MG TABS tablet Take 1 tablet (5 mg total) by mouth 2 (two) times daily. 180 tablet 3   docusate sodium (COLACE) 100 MG capsule Take 1 capsule (100 mg total) by mouth daily. 10 capsule 0   Elastic Bandages & Supports (MEDICAL COMPRESSION STOCKINGS) MISC by Does not apply route.     folic acid (FOLVITE) 1 MG tablet Take 1 tablet (1 mg total) by mouth daily. 90 tablet 3   hydrocortisone (ANUSOL-HC) 2.5 % rectal cream Place rectally 3 (three) times daily. 30 g 0   iron polysaccharides (NIFEREX) 150 MG capsule Take 1 capsule (150 mg total) by mouth daily. 30 capsule 1   latanoprost (XALATAN) 0.005 % ophthalmic solution Place 1 drop into both eyes at bedtime.     linagliptin (TRADJENTA) 5 MG TABS tablet Take 1 tablet (5 mg total) by mouth daily. 90 tablet 1   metolazone (ZAROXOLYN) 2.5 MG tablet Take 1 tablet (2.5 mg total) by mouth daily. (Patient taking differently: Take 2.5 mg by mouth 3 (three) times daily.) 30 tablet 2   metoprolol succinate (TOPROL-XL) 25 MG 24 hr tablet Take 0.5 tablets (12.5 mg total) by mouth daily. 90 tablet 1   midodrine (PROAMATINE) 10 MG tablet Take 1 tablet (10 mg total) by mouth 3 (three) times daily with meals. 270 tablet 1   Multiple Vitamin (MULTIVITAMIN WITH MINERALS) TABS tablet Take 1 tablet by mouth daily.     pantoprazole (PROTONIX) 40 MG tablet Take 1 tablet (40 mg total) by mouth 2 (two) times daily.  180 tablet 3   polyethylene glycol (MIRALAX / GLYCOLAX) 17 g packet Take 17 g by mouth daily. 14 each 0   potassium chloride SA (KLOR-CON) 20 MEQ tablet Take 1 tablet (20 mEq total) by mouth daily. 90 tablet 1   spironolactone (ALDACTONE) 25 MG tablet Take 1 tablet (25 mg total) by mouth daily. 90 tablet 1   torsemide 40 MG TABS Take 40 mg by mouth daily. 30 tablet 2   traMADol (ULTRAM) 50 MG tablet Take 1 tablet (50 mg total) by mouth every 12 (twelve) hours as needed for moderate pain. 15 tablet 0   vitamin B-12 (CYANOCOBALAMIN) 500 MCG tablet Take 1 tablet (500 mcg total) by mouth daily. 90 tablet 3   No current facility-administered medications for this visit.     Review of Systems    Feels weak but denies chest pain, dyspnea, palpitations, PND, orthopnea, dizziness, syncope, edema, or early satiety.  All other systems reviewed and are otherwise  negative except as noted above.    Physical Exam    VS:  BP 98/68 (BP Location: Left Arm, Patient Position: Sitting, Cuff Size: Normal)   Pulse 86   Ht 5\' 2"  (1.575 m)   Wt 180 lb (81.6 kg)   SpO2 92%   BMI 32.92 kg/m  , BMI Body mass index is 32.92 kg/m.     GEN: Well nourished, well developed, in no acute distress. HEENT: normal. Neck: Supple, no JVD, carotid bruits, or masses. Cardiac: Irregularly irregular, 2/6 systolic murmur throughout, no rubs or gallops. No clubbing, cyanosis, edema.  Radials/PT 2+ and equal bilaterally.  Respiratory:  Respirations regular and unlabored, clear to auscultation bilaterally. GI: Obese, soft, nontender, nondistended, BS + x 4. MS: no deformity or atrophy. Skin: warm and dry, no rash. Neuro:  Strength and sensation are intact. Psych: Normal affect.  Accessory Clinical Findings     Lab Results  Component Value Date   WBC 4.5 08/26/2021   HGB 10.8 (L) 08/26/2021   HCT 35.2 (L) 08/26/2021   MCV 86.1 08/26/2021   PLT 370 08/26/2021   Lab Results  Component Value Date   CREATININE 2.29 (H)  09/07/2021   BUN 80 (H) 09/07/2021   NA 127 (L) 09/07/2021   K 2.7 (LL) 09/07/2021   CL 77 (L) 09/07/2021   CO2 34 (H) 09/07/2021   Lab Results  Component Value Date   ALT 14 08/25/2021   AST 26 08/25/2021   ALKPHOS 84 08/25/2021   BILITOT 1.6 (H) 08/25/2021   Lab Results  Component Value Date   CHOL 140 07/13/2021   HDL 33 (L) 07/13/2021   LDLCALC 91 07/13/2021   TRIG 84 07/13/2021    Lab Results  Component Value Date   HGBA1C 6.9 (H) 07/13/2021    Assessment & Plan    1.  Chronic heart failure with preserved ejection fraction: Patient recently admitted with anasarca and complicated hospital course including left pleural effusion requiring thoracentesis, pericardial effusion requiring pericardiocentesis, acute kidney injury, and GI bleed requiring transfusion.  She was discharged home on torsemide 40 mg daily, metolazone 2.5 mg daily, and spironolactone 25 mg daily.  She had labs drawn on November 14 with a rise in BUN and creatinine to 70 and 2.37 respectively and was advised to reduce metolazone to 2.5 mg 3 times a week however, she has been taking metolazone 2.5 mg 3 times a day since the 15th.  She is euvolemic on examination.  Her weight is down 13 pounds since discharge.  I obtained stat labs and her BUN is up to 80 with a creatinine of 2.29.  Sodium is low at 127 with a potassium of 2.7.  I reached out to Ms. Hickok this evening.  She will remain off of metolazone until further notice.  She will take an additional 40 mEq of potassium chloride now and again at bedtime tonight.  Will increase potassium chloride to 40 mEq daily over the weekend with follow-up basic metabolic panel on the 02RK.  Continue torsemide and spironolactone as prescribed for the time being.  2.  Hyponatremia/hypokalemia: As above, overdosing on metolazone due to confusion about instructions.  Sodium 127 but with potassium of 2.7.  Metolazone has been discontinued and supplementing potassium.  Follow-up  labs on Monday.  3.  Acute on chronic stage III kidney disease: See numbers 1 and 2.  BUN 80 with creatinine of 2.29 today (was 70/2.37 on November 14).  Metolazone now discontinued.  Plan to  follow-up labs as outlined above, on November 21.  4.  Permanent atrial fibrillation: Rate controlled on beta-blocker therapy.  She is anticoagulated with Eliquis with stable H&H despite GI bleed in late October.  5.  Essential hypertension: Blood pressure somewhat soft at 98/68 in the setting of acute kidney injury and overdiuresis.  Asymptomatic.  Metolazone discontinued.  6.  Type 2 diabetes mellitus: A1c 6.9.  Followed by primary care.  7.  GI bleed: This occurred during hospitalization requiring 2 units of packed red blood cells.  Seen by GI and given absence of recurrence and stable H&H, GI evaluation deferred.  H&H stable on November 14.  No further bleeding that she is aware of.  She remains on PPI and iron.  8.  Disposition: Follow-up basic metabolic panel on Monday morning-November 21.  I offered to see her back next Wednesday however, she wished to defer for an additional week.  Murray Hodgkins, NP 09/07/2021, 6:06 PM

## 2021-09-07 NOTE — Telephone Encounter (Signed)
That was drawn by cardiology. Forwarding to Cardiology

## 2021-09-10 ENCOUNTER — Other Ambulatory Visit
Admission: RE | Admit: 2021-09-10 | Discharge: 2021-09-10 | Disposition: A | Discharge status: Home / Self Care | Payer: Medicare PPO | Attending: Nurse Practitioner | Admitting: Nurse Practitioner

## 2021-09-10 ENCOUNTER — Telehealth: Payer: Self-pay

## 2021-09-10 ENCOUNTER — Ambulatory Visit: Payer: Self-pay | Admitting: *Deleted

## 2021-09-10 ENCOUNTER — Telehealth: Payer: Self-pay | Admitting: *Deleted

## 2021-09-10 DIAGNOSIS — I5033 Acute on chronic diastolic (congestive) heart failure: Secondary | ICD-10-CM

## 2021-09-10 DIAGNOSIS — N179 Acute kidney failure, unspecified: Secondary | ICD-10-CM | POA: Insufficient documentation

## 2021-09-10 DIAGNOSIS — I5032 Chronic diastolic (congestive) heart failure: Secondary | ICD-10-CM

## 2021-09-10 DIAGNOSIS — E876 Hypokalemia: Secondary | ICD-10-CM | POA: Insufficient documentation

## 2021-09-10 DIAGNOSIS — E875 Hyperkalemia: Secondary | ICD-10-CM

## 2021-09-10 DIAGNOSIS — I4821 Permanent atrial fibrillation: Secondary | ICD-10-CM

## 2021-09-10 DIAGNOSIS — N1832 Chronic kidney disease, stage 3b: Secondary | ICD-10-CM

## 2021-09-10 LAB — BASIC METABOLIC PANEL
Anion gap: 16 — ABNORMAL HIGH (ref 5–15)
BUN: 84 mg/dL — ABNORMAL HIGH (ref 8–23)
CO2: 34 mmol/L — ABNORMAL HIGH (ref 22–32)
Calcium: 10 mg/dL (ref 8.9–10.3)
Chloride: 78 mmol/L — ABNORMAL LOW (ref 98–111)
Creatinine, Ser: 2.6 mg/dL — ABNORMAL HIGH (ref 0.44–1.00)
GFR, Estimated: 19 mL/min — ABNORMAL LOW (ref >60)
Glucose, Bld: 513 mg/dL (ref 70–99)
Potassium: 2.7 mmol/L — CL (ref 3.5–5.1)
Sodium: 128 mmol/L — ABNORMAL LOW (ref 135–145)

## 2021-09-10 NOTE — Telephone Encounter (Signed)
Patient reports feeling shaky due to blood glucose elevated. Reports she was notified of elevate glucose from today' s lab results. Glucose was 515 and K+ 2.7  at 3:15 pm. Patient reports she is calling to make PCP aware. No other symptoms of headache, blurred vision , frequent urination reported. Patient rechecked blood glucose with her home monitor and reports results as "high". Patient reports she is not currently taking any medications for diabetes management since discharging from the hospital 08/28/21. Patient' s husband with her and will assist as needed. Denies chest pain, difficulty breathing or palpitations. Reviewed with patient possible virtual visit will be scheduled for tomorrow . Patient unable to increase fluids due to fluid restrictions per cardiology. Instructed patient to recheck blood glucose this pm and notify PCP in am.. patient declines going to ED at this time. Care advice given. Patient verbalized understanding of care advise and to call back or go to ED or call 911 if symptoms worsen.

## 2021-09-10 NOTE — Telephone Encounter (Signed)
Phone visit tomorrow please

## 2021-09-10 NOTE — Telephone Encounter (Signed)
Incoming call form [facility] lab --   Critical lab -- Glucose 513 Critical potassium -- 2.7

## 2021-09-10 NOTE — Telephone Encounter (Signed)
Reviewed lab results with patient and recommendations. She verbalized all instructions, repeated them back after writing them down, and was agreeable to have repeat labs tomorrow. She reports that she is not on any diabetes medication because she was taken off. Reviewed that she should reach out to her primary care provider and that I will send her a message as well. Advised we would call tomorrow with her repeat results as well. She verbalized understanding of our conversation, agreement with plan, and had no further questions at this time.      Theora Gianotti, NP  09/10/2021  3:37 PM EST     Glucose also markedly elevated- ensure compliance w/ diabetes meds and pcp f/u.

## 2021-09-10 NOTE — Telephone Encounter (Signed)
-----   Message from Theora Gianotti, NP sent at 09/10/2021  3:27 PM EST ----- Potassium remains very low @ 2.7.  Chloride also very low.  Hold torsemide. KCl 83meq x 1, in 2 hrs x 1, and in 4 hrs x 1 (total of additional 120 meq this evening).  Increase KCl to 74meq twice daily.  F/u BMET and Mg tomorrow 11/22.

## 2021-09-10 NOTE — Telephone Encounter (Signed)
Reason for Disposition  Blood glucose > 500 mg/dL (27.8 mmol/L)  Answer Assessment - Initial Assessment Questions 1. BLOOD GLUCOSE: "What is your blood glucose level?"      515 at 3:15 pm per lab results  monitor reports high now  2. ONSET: "When did you check the blood glucose?"     Now  3. USUAL RANGE: "What is your glucose level usually?" (e.g., usual fasting morning value, usual evening value)     140's- 150's  4. KETONES: "Do you check for ketones (urine or blood test strips)?" If yes, ask: "What does the test show now?"      na 5. TYPE 1 or 2:  "Do you know what type of diabetes you have?"  (e.g., Type 1, Type 2, Gestational; doesn't know)      Type 2  6. INSULIN: "Do you take insulin?" "What type of insulin(s) do you use? What is the mode of delivery? (syringe, pen; injection or pump)?"      na 7. DIABETES PILLS: "Do you take any pills for your diabetes?" If yes, ask: "Have you missed taking any pills recently?"     na 8. OTHER SYMPTOMS: "Do you have any symptoms?" (e.g., fever, frequent urination, difficulty breathing, dizziness, weakness, vomiting)     Shaky  9. PREGNANCY: "Is there any chance you are pregnant?" "When was your last menstrual period?"     na  Protocols used: Diabetes - High Blood Sugar-A-AH

## 2021-09-11 ENCOUNTER — Telehealth: Payer: Self-pay | Admitting: *Deleted

## 2021-09-11 ENCOUNTER — Ambulatory Visit (INDEPENDENT_AMBULATORY_CARE_PROVIDER_SITE_OTHER): Payer: Medicare PPO | Admitting: Family Medicine

## 2021-09-11 ENCOUNTER — Other Ambulatory Visit
Admission: RE | Admit: 2021-09-11 | Discharge: 2021-09-11 | Disposition: A | Discharge status: Home / Self Care | Payer: Medicare PPO | Attending: Nurse Practitioner | Admitting: Nurse Practitioner

## 2021-09-11 ENCOUNTER — Encounter: Payer: Self-pay | Admitting: Family Medicine

## 2021-09-11 DIAGNOSIS — N179 Acute kidney failure, unspecified: Secondary | ICD-10-CM | POA: Insufficient documentation

## 2021-09-11 DIAGNOSIS — I5032 Chronic diastolic (congestive) heart failure: Secondary | ICD-10-CM | POA: Insufficient documentation

## 2021-09-11 DIAGNOSIS — E875 Hyperkalemia: Secondary | ICD-10-CM | POA: Insufficient documentation

## 2021-09-11 DIAGNOSIS — I5033 Acute on chronic diastolic (congestive) heart failure: Secondary | ICD-10-CM | POA: Diagnosis not present

## 2021-09-11 DIAGNOSIS — I4821 Permanent atrial fibrillation: Secondary | ICD-10-CM | POA: Insufficient documentation

## 2021-09-11 DIAGNOSIS — N1832 Chronic kidney disease, stage 3b: Secondary | ICD-10-CM | POA: Insufficient documentation

## 2021-09-11 DIAGNOSIS — E876 Hypokalemia: Secondary | ICD-10-CM

## 2021-09-11 DIAGNOSIS — E1121 Type 2 diabetes mellitus with diabetic nephropathy: Secondary | ICD-10-CM | POA: Diagnosis not present

## 2021-09-11 LAB — BASIC METABOLIC PANEL
Anion gap: 14 (ref 5–15)
BUN: 78 mg/dL — ABNORMAL HIGH (ref 8–23)
CO2: 31 mmol/L (ref 22–32)
Calcium: 9.9 mg/dL (ref 8.9–10.3)
Chloride: 82 mmol/L — ABNORMAL LOW (ref 98–111)
Creatinine, Ser: 2.48 mg/dL — ABNORMAL HIGH (ref 0.44–1.00)
GFR, Estimated: 20 mL/min — ABNORMAL LOW (ref >60)
Glucose, Bld: 410 mg/dL — ABNORMAL HIGH (ref 70–99)
Potassium: 3.4 mmol/L — ABNORMAL LOW (ref 3.5–5.1)
Sodium: 127 mmol/L — ABNORMAL LOW (ref 135–145)

## 2021-09-11 LAB — GLUCOSE HEMOCUE WAIVED: Glu Hemocue Waived: 410 mg/dL (ref 70–99)

## 2021-09-11 LAB — MAGNESIUM: Magnesium: 2.2 mg/dL (ref 1.7–2.4)

## 2021-09-11 MED ORDER — POTASSIUM CHLORIDE CRYS ER 20 MEQ PO TBCR: 40.0000 meq | EXTENDED_RELEASE_TABLET | Freq: Every day | ORAL | 1 refills | Status: DC

## 2021-09-11 MED ORDER — TOUJEO MAX SOLOSTAR 300 UNIT/ML ~~LOC~~ SOPN: 20.0000 [IU] | PEN_INJECTOR | Freq: Every day | SUBCUTANEOUS | 3 refills | Status: DC

## 2021-09-11 MED ORDER — POTASSIUM CHLORIDE CRYS ER 20 MEQ PO TBCR: 40.0000 meq | EXTENDED_RELEASE_TABLET | Freq: Two times a day (BID) | ORAL | 1 refills | Status: DC

## 2021-09-11 MED ORDER — PEN NEEDLES 30G X 8 MM MISC: 1.0000 | Freq: Every day | 12 refills | Status: DC

## 2021-09-11 NOTE — Telephone Encounter (Signed)
Can we try her again please?

## 2021-09-11 NOTE — Telephone Encounter (Signed)
Patient needs a phone call today- please call her until we get her.

## 2021-09-11 NOTE — Telephone Encounter (Signed)
-----   Message from Theora Gianotti, NP sent at 09/11/2021  5:03 PM EST ----- Potassium is still low but improved.  Glucose remains elevated, though I see she was placed on insulin today. Pls continue current dose of potassium - 40 meq twice daily for today and tomorrow (Wed 11/23) and then reduce to 22meq daily.  Cont to hold torsemide.  I'd like a f/u bmet on Friday 11/25, please. Magnesium is normal.

## 2021-09-11 NOTE — Assessment & Plan Note (Addendum)
Sugars running very high. Kidney function still up. Will start insulin today. Patient educated on how to give the shot in the office today. Call with any concerns. Recheck sugars on Monday.

## 2021-09-11 NOTE — Telephone Encounter (Signed)
Lmom for patient to schedule f/u appointment with provider.  Please see notes below.

## 2021-09-11 NOTE — Progress Notes (Addendum)
BP 108/71   Pulse (!) 101   Temp (!) 97.5 F (36.4 C)   Wt 178 lb (80.7 kg)   SpO2 96%   BMI 32.56 kg/m    Subjective:    Patient ID: Karen Farley, female    DOB: 1945-11-29, 75 y.o.   MRN: [id number]  HPI: SHAUGHNESSY GETHERS is a 75 y.o. female  Chief Complaint  Patient presents with   Diabetes    DIABETES Hypoglycemic episodes:no Polydipsia/polyuria: no Visual disturbance: no Chest pain: no Paresthesias: no Glucose Monitoring: yes  Accucheck frequency: BID  Fasting glucose: 334  Evening: 449 Taking Insulin?: no  Long acting insulin:  Short acting insulin: Blood Pressure Monitoring: not checking Retinal Examination: Not up to Date Foot Exam: Up to Date Diabetic Education: Completed Pneumovax: Up to Date Influenza: Up to Date Aspirin: no  Relevant past medical, surgical, family and social history reviewed and updated as indicated. Interim medical history since our last visit reviewed. Allergies and medications reviewed and updated.  Review of Systems  Constitutional: Negative.   Respiratory: Negative.    Cardiovascular: Negative.   Gastrointestinal: Negative.   Musculoskeletal: Negative.   Neurological: Negative.   Psychiatric/Behavioral: Negative.     Per HPI unless specifically indicated above     Objective:    BP 108/71   Pulse (!) 101   Temp (!) 97.5 F (36.4 C)   Wt 178 lb (80.7 kg)   SpO2 96%   BMI 32.56 kg/m   Wt Readings from Last 3 Encounters:  09/11/21 178 lb (80.7 kg)  09/07/21 180 lb (81.6 kg)  09/04/21 184 lb 6.4 oz (83.6 kg)    Physical Exam Vitals and nursing note reviewed.  Constitutional:      General: She is not in acute distress.    Appearance: Normal appearance. She is not ill-appearing, toxic-appearing or diaphoretic.  HENT:     Head: Normocephalic and atraumatic.     Right Ear: External ear normal.     Left Ear: External ear normal.     Nose: Nose normal.     Mouth/Throat:     Mouth: Mucous membranes are  moist.     Pharynx: Oropharynx is clear.  Eyes:     General: No scleral icterus.       Right eye: No discharge.        Left eye: No discharge.     Extraocular Movements: Extraocular movements intact.     Conjunctiva/sclera: Conjunctivae normal.     Pupils: Pupils are equal, round, and reactive to light.  Cardiovascular:     Rate and Rhythm: Normal rate and regular rhythm.     Pulses: Normal pulses.     Heart sounds: Normal heart sounds. No murmur heard.   No friction rub. No gallop.  Pulmonary:     Effort: Pulmonary effort is normal. No respiratory distress.     Breath sounds: Normal breath sounds. No stridor. No wheezing, rhonchi or rales.  Chest:     Chest wall: No tenderness.  Musculoskeletal:        General: Normal range of motion.     Cervical back: Normal range of motion and neck supple.  Skin:    General: Skin is warm and dry.     Capillary Refill: Capillary refill takes less than 2 seconds.     Coloration: Skin is not jaundiced or pale.     Findings: No bruising, erythema, lesion or rash.  Neurological:     General: No focal  deficit present.     Mental Status: She is alert and oriented to person, place, and time. Mental status is at baseline.  Psychiatric:        Mood and Affect: Mood normal.        Behavior: Behavior normal.        Thought Content: Thought content normal.        Judgment: Judgment normal.    Results for orders placed or performed in visit on 09/11/21  Glucose Hemocue Waived (STAT)  Result Value Ref Range   Glu Hemocue Waived 410 (HH) 70 - 99 mg/dL      Assessment & Plan:   Problem List Items Addressed This Visit       Endocrine   Type 2 diabetes mellitus with renal complication ([place])    Sugars running very high. Kidney function still up. Will start insulin today. Patient educated on how to give the shot in the office today. Call with any concerns. Recheck sugars on Monday.      Relevant Medications   insulin glargine, 2 Unit Dial,  (TOUJEO MAX SOLOSTAR) 300 UNIT/ML Solostar Pen   Other Relevant Orders   Glucose Hemocue Waived (STAT) (Completed)     Follow up plan: Return Monday Phone visit.

## 2021-09-11 NOTE — Telephone Encounter (Signed)
Telephone appointment scheduled for today at 2:00 pm.

## 2021-09-11 NOTE — Addendum Note (Signed)
Addended by: Valerie Roys on: 09/11/2021 05:27 PM   Modules accepted: Orders, Level of Service

## 2021-09-11 NOTE — Telephone Encounter (Signed)
Lmom for patient to schedule f/u appointment with provider.

## 2021-09-11 NOTE — Telephone Encounter (Signed)
Left voicemail message to call back for review of results and recommendations.  

## 2021-09-12 ENCOUNTER — Ambulatory Visit: Payer: Medicare PPO | Admitting: Nurse Practitioner

## 2021-09-12 DIAGNOSIS — L570 Actinic keratosis: Secondary | ICD-10-CM | POA: Diagnosis not present

## 2021-09-12 DIAGNOSIS — X32XXXA Exposure to sunlight, initial encounter: Secondary | ICD-10-CM | POA: Diagnosis not present

## 2021-09-12 NOTE — Telephone Encounter (Signed)
Patient made aware of lab results and Ignacia Bayley, NP recommendation. Patient will take - 40 meq twice daily for today  and then reduce to 3meq daily.  Cont to hold torsemide.    Patient will have a repeat [facility] and f/u as planned.  Patient verbalized understanding with read back. Patient voiced appreciation for the call.

## 2021-09-14 ENCOUNTER — Other Ambulatory Visit
Admission: RE | Admit: 2021-09-14 | Discharge: 2021-09-14 | Disposition: A | Discharge status: Home / Self Care | Payer: Medicare PPO | Attending: Nurse Practitioner | Admitting: Nurse Practitioner

## 2021-09-14 DIAGNOSIS — E876 Hypokalemia: Secondary | ICD-10-CM | POA: Diagnosis not present

## 2021-09-14 DIAGNOSIS — N179 Acute kidney failure, unspecified: Secondary | ICD-10-CM | POA: Diagnosis not present

## 2021-09-14 LAB — BASIC METABOLIC PANEL
Anion gap: 9 (ref 5–15)
BUN: 51 mg/dL — ABNORMAL HIGH (ref 8–23)
CO2: 27 mmol/L (ref 22–32)
Calcium: 9.6 mg/dL (ref 8.9–10.3)
Chloride: 96 mmol/L — ABNORMAL LOW (ref 98–111)
Creatinine, Ser: 1.93 mg/dL — ABNORMAL HIGH (ref 0.44–1.00)
GFR, Estimated: 27 mL/min — ABNORMAL LOW (ref >60)
Glucose, Bld: 364 mg/dL — ABNORMAL HIGH (ref 70–99)
Potassium: 4 mmol/L (ref 3.5–5.1)
Sodium: 132 mmol/L — ABNORMAL LOW (ref 135–145)

## 2021-09-15 LAB — FUNGUS CULTURE WITH STAIN

## 2021-09-15 LAB — FUNGUS CULTURE RESULT

## 2021-09-15 LAB — FUNGAL ORGANISM REFLEX

## 2021-09-17 ENCOUNTER — Ambulatory Visit (INDEPENDENT_AMBULATORY_CARE_PROVIDER_SITE_OTHER): Payer: Medicare PPO | Admitting: Family Medicine

## 2021-09-17 ENCOUNTER — Encounter: Payer: Self-pay | Admitting: Family Medicine

## 2021-09-17 DIAGNOSIS — E1121 Type 2 diabetes mellitus with diabetic nephropathy: Secondary | ICD-10-CM | POA: Diagnosis not present

## 2021-09-17 MED ORDER — TOUJEO MAX SOLOSTAR 300 UNIT/ML ~~LOC~~ SOPN: 22.0000 [IU] | PEN_INJECTOR | Freq: Every day | SUBCUTANEOUS | 3 refills | Status: DC

## 2021-09-17 NOTE — Telephone Encounter (Signed)
Appointment scheduled.

## 2021-09-17 NOTE — Progress Notes (Signed)
Wt 188 lb (85.3 kg)   BMI 34.39 kg/m    Subjective:    Patient ID: Karen Farley, female    DOB: [birth year], 75 y.o.   MRN: [id number]  HPI: Karen Farley is a 75 y.o. female  Chief Complaint  Patient presents with   glucose check    Patient states her glucose this morning was 188.    DIABETES Hypoglycemic episodes:no Polydipsia/polyuria: no Visual disturbance: no Chest pain: no Paresthesias: no Glucose Monitoring: yes  Accucheck frequency: BID  Fasting glucose: 142  Evening: 250, 261 Taking Insulin?: yes  Long acting insulin: 20 units tou Blood Pressure Monitoring: not checking Retinal Examination: Not up to Date Foot Exam: Not up to Date Diabetic Education: Completed Pneumovax: Up to Date Influenza: Up to Date Aspirin: no   Relevant past medical, surgical, family and social history reviewed and updated as indicated. Interim medical history since our last visit reviewed. Allergies and medications reviewed and updated.  Review of Systems  Constitutional:  Positive for unexpected weight change. Negative for activity change, appetite change, chills, diaphoresis, fatigue and fever.  HENT: Negative.    Respiratory: Negative.    Cardiovascular: Negative.   Gastrointestinal: Negative.   Psychiatric/Behavioral: Negative.     Per HPI unless specifically indicated above     Objective:    Wt 188 lb (85.3 kg)   BMI 34.39 kg/m   Wt Readings from Last 3 Encounters:  09/17/21 188 lb (85.3 kg)  09/11/21 178 lb (80.7 kg)  09/07/21 180 lb (81.6 kg)    Physical Exam Vitals and nursing note reviewed.  Pulmonary:     Effort: Pulmonary effort is normal. No respiratory distress.     Comments: Speaking in full sentences Neurological:     Mental Status: She is alert.  Psychiatric:        Mood and Affect: Mood normal.        Behavior: Behavior normal.        Thought Content: Thought content normal.        Judgment: Judgment normal.    Results for orders  placed or performed during the hospital encounter of 67/20/94  Basic metabolic panel  Result Value Ref Range   Sodium 132 (L) 135 - 145 mmol/L   Potassium 4.0 3.5 - 5.1 mmol/L   Chloride 96 (L) 98 - 111 mmol/L   CO2 27 22 - 32 mmol/L   Glucose, Bld 364 (H) 70 - 99 mg/dL   BUN 51 (H) 8 - 23 mg/dL   Creatinine, Ser 1.93 (H) 0.44 - 1.00 mg/dL   Calcium 9.6 8.9 - 10.3 mg/dL   GFR, Estimated 27 (L) >60 mL/min   Anion gap 9 5 - 15      Assessment & Plan:   Problem List Items Addressed This Visit       Endocrine   Type 2 diabetes mellitus with renal complication (HCC)    Sugars starting to do a bit better. Will increase her toujeo to 22units and recheck on Thursday.      Relevant Medications   insulin glargine, 2 Unit Dial, (TOUJEO MAX SOLOSTAR) 300 UNIT/ML Solostar Pen     Follow up plan: Return Please double book me on Thursday at 11:20 as a virtual.   This visit was completed via telephone due to the restrictions of the COVID-19 pandemic. All issues as above were discussed and addressed but no physical exam was performed. If it was felt that the patient should be evaluated in  the office, they were directed there. The patient verbally consented to this visit. Patient was unable to complete an audio/visual visit due to Lack of equipment. Due to the catastrophic nature of the COVID-19 pandemic, this visit was done through audio contact only. Location of the patient: home Location of the provider: work Those involved with this call:  Provider: Park Liter, DO CMA: Louanna Raw, CMA Front Desk/Registration: [employer]  Time spent on call:  15 minutes on the phone discussing health concerns. 23 minutes total spent in review of patient's record and preparation of their chart.

## 2021-09-17 NOTE — Assessment & Plan Note (Signed)
Sugars starting to do a bit better. Will increase her toujeo to 22units and recheck on Thursday.

## 2021-09-17 NOTE — Progress Notes (Signed)
Patient ID: Karen Farley, female    DOB: 04-30-1946, 75 y.o.   MRN: [id number]  HPI  Ms Karen Farley is a 75 y/o female with a history of DM, hyperlipidemia, HTN, CKD, DDD, GIB, lymphedema, permanent atrial fibrillation, pleural effusion and chronic heart failure.   Echo report from 08/23/21 reviewed and showed an EF of 60-65% along with mild LVH, severe LAE and moderate MR.   Admitted 07/26/21 due to worsening shortness of breath and pedal edema. Thoracentesis performed with removal of 679ml. Initially given IV lasix with IV albumin. Transitioned to lasix drip with transition to oral diuretics. GI, EP, nephrology and wound consults obtained.  S/p percardiocentesis x1 08/05/21. Needed vasopressors due to hypotension. Paracentesis also performed. Transfused RBC's due to anemia. Discharged after 33 days.  She presents today for her initial visit with a chief complaint of moderate fatigue with little exertion. She says that this has been present for at least a year with varying levels of severity. She has associated dry cough, decreased appetite, abdominal distention, chronic pain, difficulty sleeping and gradual weight gain along with this. She denies any dizziness, palpitations, pedal edema, chest pain or shortness of breath.   Says that her weight is gradually rising since all her diuretics were on hold due to hypokalemia and worsening renal function after incorrectly taking her metolazone.   Drinks ~ 40 ounces of fluids daily. Doesn't add salt to her food but does eat out often as neither her nor her husband cooks much.   Past Medical History:  Diagnosis Date   CHF (congestive heart failure) ([place])    Chronic heart failure with preserved ejection fraction (HFpEF) ([place])    a. 11/2019 Echo: EF 60-65%; b. 07/2021 Echo: EF 60-65%.   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 07/2021 req 2u prbcs. Eval deferred given resolution of bleeding and cardiac issues.   Hyperlipidemia    Hypertension    Lymphedema    Morbid obesity ([place])    Osteoarthritis of both knees    Osteopenia    Pericardial effusion    a. 07/2021 Echo: Nl EF. Large pericardial effusion-->s/p pericardiocentesis of 729ml; b. 08/23/2021 Echo: EF 60-65%, mild LVH, Nl RV fxn, sev dil LA, moderate pericardial effusion. L pleural effusion.   Permanent atrial fibrillation (HCC)    Pleural effusion, left    a. 07/2021 s/p thoracentesis - 674ml.   Past Surgical History:  Procedure Laterality Date   CHOLECYSTECTOMY     DILATION AND CURETTAGE OF UTERUS     IR FLUORO GUIDE CV LINE RIGHT  08/16/2021   IR THORACENTESIS ASP PLEURAL SPACE W/IMG GUIDE  08/16/2021   PERICARDIOCENTESIS N/A 08/05/2021   Procedure: PERICARDIOCENTESIS;  Surgeon: Isaias Cowman, MD;  Location: [facility];  Service: Cardiovascular;  Laterality: N/A;   TEAR DUCT PROBING WITH STRABISMUS REPAIR Right    TONSILLECTOMY     Family History  Problem Relation Age of Onset   Heart disease Mother        CHF   Osteoporosis Mother    Breast cancer Mother    Heart disease Father 88   Cancer Brother        Colon CA- 2002- Youngest brother   Parkinson's disease Brother  Younger Brother   Heart disease Brother        2 brothers   Cancer Brother    Breast cancer Maternal Grandmother    Social History   Tobacco Use   Smoking status: Never   Smokeless tobacco: Never  Substance Use Topics   Alcohol use: Not Currently    Alcohol/week: 0.0 standard drinks    Comment: on rare occasion   No Known Allergies  Prior to Admission medications   Medication Sig Start Date End Date Taking? Authorizing Provider  apixaban (ELIQUIS) 5 MG TABS tablet Take 1 tablet (5 mg total) by mouth 2 (two) times daily. 04/18/21  Yes Fritzi Mandes, MD   Cholecalciferol (VITAMIN D3) 125 MCG (5000 UT) TABS Take by mouth.   Yes [provider]  docusate sodium (COLACE) 100 MG capsule Take 1 capsule (100 mg total) by mouth daily. 08/28/21  Yes Fritzi Mandes, MD  Elastic Bandages & Supports ([place]) [place] by Does not apply route.   Yes Johnson, [name], DO  folic acid (FOLVITE) 1 MG tablet Take 1 tablet (1 mg total) by mouth daily. 09/04/21  Yes Johnson, [name], DO  hydrocortisone (ANUSOL-HC) 2.5 % rectal cream Place rectally 3 (three) times daily. 08/28/21  Yes Fritzi Mandes, MD  insulin glargine, 2 Unit Dial, (TOUJEO MAX SOLOSTAR) 300 UNIT/ML Solostar Pen Inject 22 Units into the skin at bedtime. 09/17/21  Yes Johnson, [name], DO  Insulin Pen Needle (PEN NEEDLES) 30G X 8 MM MISC 1 each by Does not apply route daily. 09/11/21  Yes Johnson, [name], DO  iron polysaccharides (NIFEREX) 150 MG capsule Take 1 capsule (150 mg total) by mouth daily. 08/28/21  Yes Fritzi Mandes, MD  latanoprost (XALATAN) 0.005 % ophthalmic solution Place 1 drop into both eyes at bedtime.   Yes [provider]  linagliptin (TRADJENTA) 5 MG TABS tablet Take 1 tablet (5 mg total) by mouth daily. 09/04/21  Yes Johnson, [name], DO  metoprolol succinate (TOPROL-XL) 25 MG 24 hr tablet Take 0.5 tablets (12.5 mg total) by mouth daily. 09/04/21  Yes Johnson, [name], DO  midodrine (PROAMATINE) 10 MG tablet Take 1 tablet (10 mg total) by mouth 3 (three) times daily with meals. 09/04/21  Yes Johnson, [name], DO  Multiple Vitamin (MULTIVITAMIN WITH MINERALS) TABS tablet Take 1 tablet by mouth daily.   Yes [provider]  pantoprazole (PROTONIX) 40 MG tablet Take 1 tablet (40 mg total) by mouth 2 (two) times daily. 09/04/21  Yes Johnson, [name], DO  polyethylene glycol (MIRALAX / GLYCOLAX) 17 g packet Take 17 g by mouth daily. 08/28/21  Yes Fritzi Mandes, MD  potassium chloride SA (KLOR-CON) 20 MEQ tablet Take 2 tablets (40 mEq total) by mouth  daily. 09/11/21 10/11/21 Yes Theora Gianotti, NP  spironolactone (ALDACTONE) 25 MG tablet Take 1 tablet (25 mg total) by mouth daily. 09/04/21  Yes Johnson, [name], DO  traMADol (ULTRAM) 50 MG tablet Take 1 tablet (50 mg total) by mouth every 12 (twelve) hours as needed for moderate pain. 08/28/21  Yes Fritzi Mandes, MD  vitamin B-12 (CYANOCOBALAMIN) 500 MCG tablet Take 1 tablet (500 mcg total) by mouth daily. 09/04/21  Yes Johnson, [name], DO  metolazone (ZAROXOLYN) 2.5 MG tablet Take 1 tablet (2.5 mg total) by mouth daily. Patient not taking: Reported on 09/11/2021 08/28/21   Fritzi Mandes, MD    Review of Systems  Constitutional:  Positive for appetite change (decreased) and fatigue (  easily).  HENT:  Negative for congestion, postnasal drip and sore throat.   Eyes: Negative.   Respiratory:  Positive for cough (dry, hacky). Negative for chest tightness and shortness of breath.   Cardiovascular:  Negative for chest pain, palpitations and leg swelling.  Gastrointestinal:  Positive for abdominal distention ("very little"). Negative for abdominal pain.  Endocrine: Negative.   Genitourinary: Negative.   Musculoskeletal:  Positive for arthralgias (knee pain) and back pain. Negative for neck pain.  Skin:  Positive for wound (sacral bedsore).  Allergic/Immunologic: Negative.   Neurological:  Negative for dizziness and light-headedness.  Hematological:  Negative for adenopathy. Does not bruise/bleed easily.  Psychiatric/Behavioral:  Positive for sleep disturbance (not sleeping well). Negative for dysphoric mood. The patient is not nervous/anxious.    Vitals:   09/19/21 1147  BP: (!) 111/50  Pulse: (!) 101  Resp: 20  SpO2: 99%  Weight: 189 lb 4 oz (85.8 kg)  Height: 5\' 2"  (1.575 m)   Wt Readings from Last 3 Encounters:  09/19/21 189 lb 4 oz (85.8 kg)  09/17/21 188 lb (85.3 kg)  09/11/21 178 lb (80.7 kg)   Lab Results  Component Value Date   CREATININE 1.93 (H) 09/14/2021    CREATININE 2.48 (H) 09/11/2021   CREATININE 2.60 (H) 09/10/2021   Physical Exam Vitals and nursing note reviewed. Exam conducted with a chaperone present (husband).  Constitutional:      Appearance: Normal appearance.  HENT:     Head: Normocephalic and atraumatic.  Cardiovascular:     Rate and Rhythm: Tachycardia present. Rhythm irregular.  Pulmonary:     Effort: Pulmonary effort is normal. No respiratory distress.     Breath sounds: No wheezing or rales.  Abdominal:     General: There is no distension.     Palpations: Abdomen is soft.  Musculoskeletal:        General: No tenderness.     Cervical back: Normal range of motion and neck supple.     Right lower leg: Edema (trace pitting) present.     Left lower leg: Edema (trace pitting) present.  Skin:    General: Skin is warm and dry.  Neurological:     General: No focal deficit present.     Mental Status: She is alert and oriented to person, place, and time.  Psychiatric:        Mood and Affect: Mood normal.        Behavior: Behavior normal.    Assessment & Plan:  1: Chronic heart failure with preserved ejection fraction with structural changes (LVH/ LAE)- - NYHA class III - minimally fluid overloaded today - weighing daily and she notes a gradual weight gain since all her diuretics have been on hold; reminded to call for an overnight weight gain of > 2 pounds or a weekly weight gain of > 5 pounds - will resume low dose torsemide as 20mg  daily (was on 40mg  with metolazone); will send message to cardiology about this as they will be seeing patient on 09/21/21 - not adding salt but admits that they don't cook much and tend to eat out often; encouraged her to make low sodium choices when possible; low sodium cookbook provided today - drinking ~ 40 ounces of liquid daily - having echo done late December - BNP 08/05/21 was 184.5  2: HTN with CKD- - BP looks good although on the low side (111/50) - saw PCP Wynetta Emery) 09/17/21 -  saw nephrology Candiss Norse) 09/03/21 - BMP 09/14/21 reviewed and  showed sodium 132, potassium 4.0, creatinine 1.93 and GFR 27  3: DM- - A1c 07/13/21 was 6.9% - glucose at home today was 1432  4: Atrial fibrillation- - saw cardiology Sharolyn Douglas) 09/07/21 - on apixaban and metoprolol  5: Lymphedema- - stage 2 - wearing compression socks daily with improvement of edema - using compression boots twice daily with improvement of edema   Medication list reviewed.   Return in 3 months or sooner for any questions/problems before then.

## 2021-09-18 NOTE — Progress Notes (Signed)
Pt is scheduled °

## 2021-09-19 ENCOUNTER — Encounter: Payer: Self-pay | Admitting: Family

## 2021-09-19 ENCOUNTER — Ambulatory Visit: Payer: Medicare PPO | Attending: Family | Admitting: Family

## 2021-09-19 ENCOUNTER — Other Ambulatory Visit: Payer: Self-pay

## 2021-09-19 VITALS — BP 111/50 | HR 101 | Resp 20 | Ht 62.0 in | Wt 189.2 lb

## 2021-09-19 DIAGNOSIS — E1022 Type 1 diabetes mellitus with diabetic chronic kidney disease: Secondary | ICD-10-CM | POA: Insufficient documentation

## 2021-09-19 DIAGNOSIS — I130 Hypertensive heart and chronic kidney disease with heart failure and stage 1 through stage 4 chronic kidney disease, or unspecified chronic kidney disease: Secondary | ICD-10-CM | POA: Diagnosis not present

## 2021-09-19 DIAGNOSIS — R5383 Other fatigue: Secondary | ICD-10-CM | POA: Diagnosis not present

## 2021-09-19 DIAGNOSIS — N184 Chronic kidney disease, stage 4 (severe): Secondary | ICD-10-CM

## 2021-09-19 DIAGNOSIS — I10 Essential (primary) hypertension: Secondary | ICD-10-CM | POA: Diagnosis not present

## 2021-09-19 DIAGNOSIS — G479 Sleep disorder, unspecified: Secondary | ICD-10-CM | POA: Diagnosis not present

## 2021-09-19 DIAGNOSIS — R059 Cough, unspecified: Secondary | ICD-10-CM | POA: Insufficient documentation

## 2021-09-19 DIAGNOSIS — Z79899 Other long term (current) drug therapy: Secondary | ICD-10-CM | POA: Diagnosis not present

## 2021-09-19 DIAGNOSIS — E785 Hyperlipidemia, unspecified: Secondary | ICD-10-CM | POA: Diagnosis not present

## 2021-09-19 DIAGNOSIS — Z7901 Long term (current) use of anticoagulants: Secondary | ICD-10-CM | POA: Insufficient documentation

## 2021-09-19 DIAGNOSIS — I890 Lymphedema, not elsewhere classified: Secondary | ICD-10-CM | POA: Diagnosis not present

## 2021-09-19 DIAGNOSIS — I4821 Permanent atrial fibrillation: Secondary | ICD-10-CM

## 2021-09-19 DIAGNOSIS — E1122 Type 2 diabetes mellitus with diabetic chronic kidney disease: Secondary | ICD-10-CM | POA: Diagnosis not present

## 2021-09-19 DIAGNOSIS — I5032 Chronic diastolic (congestive) heart failure: Secondary | ICD-10-CM | POA: Diagnosis not present

## 2021-09-19 DIAGNOSIS — D649 Anemia, unspecified: Secondary | ICD-10-CM | POA: Diagnosis not present

## 2021-09-19 DIAGNOSIS — N183 Chronic kidney disease, stage 3 unspecified: Secondary | ICD-10-CM | POA: Diagnosis not present

## 2021-09-19 DIAGNOSIS — R140 Abdominal distension (gaseous): Secondary | ICD-10-CM | POA: Insufficient documentation

## 2021-09-19 NOTE — Patient Instructions (Addendum)
Continue weighing daily and call for an overnight weight gain of 3 pounds or more or a weekly weight gain of more than 5 pounds.     Resume torsemide as 20mg  daily (1 tablet)

## 2021-09-20 ENCOUNTER — Encounter: Payer: Self-pay | Admitting: Family Medicine

## 2021-09-20 ENCOUNTER — Telehealth (INDEPENDENT_AMBULATORY_CARE_PROVIDER_SITE_OTHER): Payer: Medicare PPO | Admitting: Family Medicine

## 2021-09-20 DIAGNOSIS — Z0100 Encounter for examination of eyes and vision without abnormal findings: Secondary | ICD-10-CM | POA: Diagnosis not present

## 2021-09-20 DIAGNOSIS — E1121 Type 2 diabetes mellitus with diabetic nephropathy: Secondary | ICD-10-CM

## 2021-09-20 DIAGNOSIS — H2513 Age-related nuclear cataract, bilateral: Secondary | ICD-10-CM | POA: Diagnosis not present

## 2021-09-20 DIAGNOSIS — H43813 Vitreous degeneration, bilateral: Secondary | ICD-10-CM | POA: Diagnosis not present

## 2021-09-20 LAB — HM DIABETES EYE EXAM

## 2021-09-20 MED ORDER — TOUJEO MAX SOLOSTAR 300 UNIT/ML ~~LOC~~ SOPN
25.0000 [IU] | PEN_INJECTOR | Freq: Every day | SUBCUTANEOUS | 3 refills | Status: DC
Start: 2021-09-20 — End: 2021-09-26

## 2021-09-20 NOTE — Assessment & Plan Note (Signed)
Sugars doing better, but still running in the 130s-140s in the AM. Will increase her toujeo to 25units and recheck in about 5 days. Call with any concerns. Continue to monitor.

## 2021-09-20 NOTE — Progress Notes (Signed)
Wt 190 lb 6.4 oz (86.4 kg)   BMI 34.82 kg/m    Subjective:    Patient ID: Karen Farley, female    DOB: 1946/08/04, 75 y.o.   MRN: [id number]  HPI: Karen Farley is a 75 y.o. female  Chief Complaint  Patient presents with   Diabetes    Patient states her blood sugar this morning it was 151   DIABETES Hypoglycemic episodes:no Polydipsia/polyuria: no Visual disturbance: no Chest pain: no Paresthesias: no Glucose Monitoring: yes  Accucheck frequency: BID  Fasting glucose: 143, 135  Post prandial: 150 Taking Insulin?: yes  Long acting insulin: 22 units Blood Pressure Monitoring: not checking Retinal Examination: Not up to Date Foot Exam: Up to Date Diabetic Education: Completed Pneumovax: Up to Date Influenza: Up to Date Aspirin: no  Relevant past medical, surgical, family and social history reviewed and updated as indicated. Interim medical history since our last visit reviewed. Allergies and medications reviewed and updated.  Review of Systems  Constitutional: Negative.   Respiratory: Negative.    Cardiovascular: Negative.   Musculoskeletal: Negative.   Neurological: Negative.   Psychiatric/Behavioral: Negative.     Per HPI unless specifically indicated above     Objective:    Wt 190 lb 6.4 oz (86.4 kg)   BMI 34.82 kg/m   Wt Readings from Last 3 Encounters:  09/20/21 190 lb 6.4 oz (86.4 kg)  09/19/21 189 lb 4 oz (85.8 kg)  09/17/21 188 lb (85.3 kg)    Physical Exam Vitals and nursing note reviewed.  Pulmonary:     Effort: Pulmonary effort is normal. No respiratory distress.     Comments: Speaking in full sentences Neurological:     Mental Status: She is alert.  Psychiatric:        Mood and Affect: Mood normal.        Behavior: Behavior normal.        Thought Content: Thought content normal.        Judgment: Judgment normal.    Results for orders placed or performed during the hospital encounter of 88/28/00  Basic metabolic panel   Result Value Ref Range   Sodium 132 (L) 135 - 145 mmol/L   Potassium 4.0 3.5 - 5.1 mmol/L   Chloride 96 (L) 98 - 111 mmol/L   CO2 27 22 - 32 mmol/L   Glucose, Bld 364 (H) 70 - 99 mg/dL   BUN 51 (H) 8 - 23 mg/dL   Creatinine, Ser 1.93 (H) 0.44 - 1.00 mg/dL   Calcium 9.6 8.9 - 10.3 mg/dL   GFR, Estimated 27 (L) >60 mL/min   Anion gap 9 5 - 15      Assessment & Plan:   Problem List Items Addressed This Visit       Endocrine   Type 2 diabetes mellitus with renal complication (HCC)    Sugars doing better, but still running in the 130s-140s in the AM. Will increase her toujeo to 25units and recheck in about 5 days. Call with any concerns. Continue to monitor.       Relevant Medications   insulin glargine, 2 Unit Dial, (TOUJEO MAX SOLOSTAR) 300 UNIT/ML Solostar Pen     Follow up plan: Return Virtual visit 11:20AM 09/26/21.   This visit was completed via telephone due to the restrictions of the COVID-19 pandemic. All issues as above were discussed and addressed but no physical exam was performed. If it was felt that the patient should be evaluated in the office,  they were directed there. The patient verbally consented to this visit. Patient was unable to complete an audio/visual visit due to Lack of equipment. Due to the catastrophic nature of the COVID-19 pandemic, this visit was done through audio contact only. Location of the patient: home Location of the provider: work Those involved with this call:  Provider: Park Liter, DO CMA: Louanna Raw, CMA Front Desk/Registration: [employer]  Time spent on call:  15 minutes on the phone discussing health concerns. 23 minutes total spent in review of patient's record and preparation of their chart.

## 2021-09-21 ENCOUNTER — Other Ambulatory Visit: Payer: Self-pay

## 2021-09-21 ENCOUNTER — Ambulatory Visit: Payer: Medicare PPO | Admitting: Nurse Practitioner

## 2021-09-21 ENCOUNTER — Encounter: Payer: Self-pay | Admitting: Nurse Practitioner

## 2021-09-21 VITALS — BP 98/70 | HR 91 | Ht 62.0 in | Wt 190.1 lb

## 2021-09-21 DIAGNOSIS — N183 Chronic kidney disease, stage 3 unspecified: Secondary | ICD-10-CM

## 2021-09-21 DIAGNOSIS — E1122 Type 2 diabetes mellitus with diabetic chronic kidney disease: Secondary | ICD-10-CM

## 2021-09-21 DIAGNOSIS — I10 Essential (primary) hypertension: Secondary | ICD-10-CM | POA: Diagnosis not present

## 2021-09-21 DIAGNOSIS — I4821 Permanent atrial fibrillation: Secondary | ICD-10-CM

## 2021-09-21 DIAGNOSIS — I3139 Other pericardial effusion (noninflammatory): Secondary | ICD-10-CM | POA: Diagnosis not present

## 2021-09-21 DIAGNOSIS — E876 Hypokalemia: Secondary | ICD-10-CM | POA: Diagnosis not present

## 2021-09-21 DIAGNOSIS — I5032 Chronic diastolic (congestive) heart failure: Secondary | ICD-10-CM

## 2021-09-21 DIAGNOSIS — K922 Gastrointestinal hemorrhage, unspecified: Secondary | ICD-10-CM | POA: Diagnosis not present

## 2021-09-21 NOTE — Patient Instructions (Signed)
Medication Instructions:  We removed the Colace and metolazone. No other changes at this time.    *If you need a refill on your cardiac medications before your next appointment, please call your pharmacy*   Lab Work: BMET today in the office.  If you have labs (blood work) drawn today and your tests are completely normal, you will receive your results only by: [place] (if you have MyChart) OR A paper copy in the mail If you have any lab test that is abnormal or we need to change your treatment, we will call you to review the results.   Testing/Procedures: None   Follow-Up: At [facility], you and your health needs are our priority.  As part of our continuing mission to provide you with exceptional heart care, we have created designated Provider Care Teams.  These Care Teams include your primary Cardiologist (physician) and Advanced Practice Providers (APPs -  Physician Assistants and Nurse Practitioners) who all work together to provide you with the care you need, when you need it.  We recommend signing up for the patient portal called "MyChart".  Sign up information is provided on this After Visit Summary.  MyChart is used to connect with patients for Virtual Visits (Telemedicine).  Patients are able to view lab/test results, encounter notes, upcoming appointments, etc.  Non-urgent messages can be sent to your provider as well.   To learn more about what you can do with MyChart, go to [email or web address].    Your next appointment:   1 month(s)  The format for your next appointment:   In Person  Provider:   Ida Rogue, MD or Murray Hodgkins, NP

## 2021-09-21 NOTE — Progress Notes (Signed)
Office Visit    Patient Name: Karen Farley Date of Encounter: 09/21/2021  Primary Care Provider:  Valerie Roys, DO Primary Cardiologist:  Ida Rogue, MD  Chief Complaint    75 year old female with a history of HFpEF, hypertension, stage III chronic kidney disease, and lymphedema, permanent atrial fibrillation, hyperlipidemia, diabetes, pericardial effusion status post pericardiocentesis, pleural effusion status post thoracentesis, and GI bleed, who presents for follow-up of heart failure and hypokalemia.  Past Medical History    Past Medical History:  Diagnosis Date   CHF (congestive heart failure) ([place])    Chronic heart failure with preserved ejection fraction (HFpEF) ([place])    a. 11/2019 Echo: EF 60-65%; b. 07/2021 Echo: EF 60-65%.   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 07/2021 req 2u prbcs. Eval deferred given resolution of bleeding and cardiac issues.   Hyperlipidemia    Hypertension    Lymphedema    Morbid obesity ([place])    Osteoarthritis of both knees    Osteopenia    Pericardial effusion    a. 07/2021 Echo: Nl EF. Large pericardial effusion-->s/p pericardiocentesis of 770ml; b. 08/23/2021 Echo: EF 60-65%, mild LVH, Nl RV fxn, sev dil LA, moderate pericardial effusion. L pleural effusion.   Permanent atrial fibrillation (HCC)    Pleural effusion, left    a. 07/2021 s/p thoracentesis - 651ml.   Past Surgical History:  Procedure Laterality Date   CHOLECYSTECTOMY     DILATION AND CURETTAGE OF UTERUS     IR FLUORO GUIDE CV LINE RIGHT  08/16/2021   IR THORACENTESIS ASP PLEURAL SPACE W/IMG GUIDE  08/16/2021   PERICARDIOCENTESIS N/A 08/05/2021   Procedure: PERICARDIOCENTESIS;  Surgeon: Isaias Cowman, MD;  Location: [facility];  Service:  Cardiovascular;  Laterality: N/A;   TEAR DUCT PROBING WITH STRABISMUS REPAIR Right    TONSILLECTOMY      Allergies  No Known Allergies  History of Present Illness    76 year old female with the above complex past medical history including HFpEF, hypertension, stage III chronic kidney disease, permanent atrial fibrillation, hyperlipidemia, lymphedema, and diabetes.  In early October, she was admitted to [facility] with worsening dyspnea, orthopnea, and weight gain.  She had a large left pleural effusion and required thoracentesis.  She initially responded poorly to diuresis.  On October 15, she had a rectal bleed and Eliquis was discontinued.  She required 2 units of packed red blood cells and was transferred to the ICU.  CT angiogram showed focal area of extravasation and incidentally showed a large pericardial effusion.  Transthoracic echo confirmed large pericardial effusion without tamponade.  Pericardiocentesis was performed and yielded 700 mL.  Subsequent limited echocardiograms during hospitalization showed stable moderate pericardial effusion.  She required ongoing diuresis and was transitioned to torsemide and metolazone therapy.  Eliquis was resumed in the setting of stable H&H.  She was discharged home November 6.  At office follow-up on November 18, she reported that she had labs with nephrology earlier in the week and was advised to change her metolazone to 2.5 mg 3 times daily.  I confirmed with nephrology that that was not the direction, she had been told to reduce metolazone to 2.5 mg 3 times per week.  She was advised to stop  metolazone.  Labs revealed significant hypokalemia with a potassium of 2.7.  She was supplemented over the weekend and follow-up labs November 21 again showed a potassium of 2.7.  Torsemide was held and by November 25, potassium stabilized at 4.0 with a sodium of 132.  Creatinine was also stable at 1.93.  Karen Farley was seen in heart failure clinic earlier  this week and reported weight gain of approximately 10 pounds since her cardiology visit with me.  Of note, even at 190 pounds, her weight was still about 3 pounds below her hospital discharge weight.  She was placed back on torsemide 20 mg daily at that time and she has since noted some increase in urine output.  Her weight has been stable and she is 190 pounds on our scale today.  She has noted improvement in strength overall and denies dyspnea, chest pain, palpitations, PND, orthopnea, dizziness, syncope, edema, or early satiety.  Home Medications    Current Outpatient Medications  Medication Sig Dispense Refill   apixaban (ELIQUIS) 5 MG TABS tablet Take 1 tablet (5 mg total) by mouth 2 (two) times daily. 180 tablet 3   Cholecalciferol (VITAMIN D3) 125 MCG (5000 UT) TABS Take by mouth.     Elastic Bandages & Supports (MEDICAL COMPRESSION STOCKINGS) MISC by Does not apply route.     folic acid (FOLVITE) 1 MG tablet Take 1 tablet (1 mg total) by mouth daily. 90 tablet 3   hydrocortisone (ANUSOL-HC) 2.5 % rectal cream Place rectally 3 (three) times daily. 30 g 0   insulin glargine, 2 Unit Dial, (TOUJEO MAX SOLOSTAR) 300 UNIT/ML Solostar Pen Inject 25 Units into the skin at bedtime. 3 mL 3   Insulin Pen Needle (PEN NEEDLES) 30G X 8 MM MISC 1 each by Does not apply route daily. 100 each 12   iron polysaccharides (NIFEREX) 150 MG capsule Take 1 capsule (150 mg total) by mouth daily. 30 capsule 1   latanoprost (XALATAN) 0.005 % ophthalmic solution Place 1 drop into both eyes at bedtime.     linagliptin (TRADJENTA) 5 MG TABS tablet Take 1 tablet (5 mg total) by mouth daily. 90 tablet 1   metoprolol succinate (TOPROL-XL) 25 MG 24 hr tablet Take 0.5 tablets (12.5 mg total) by mouth daily. 90 tablet 1   midodrine (PROAMATINE) 10 MG tablet Take 1 tablet (10 mg total) by mouth 3 (three) times daily with meals. 270 tablet 1   Multiple Vitamin (MULTIVITAMIN WITH MINERALS) TABS tablet Take 1 tablet by mouth  daily.     pantoprazole (PROTONIX) 40 MG tablet Take 1 tablet (40 mg total) by mouth 2 (two) times daily. 180 tablet 3   polyethylene glycol (MIRALAX / GLYCOLAX) 17 g packet Take 17 g by mouth daily. 14 each 0   potassium chloride SA (KLOR-CON) 20 MEQ tablet Take 2 tablets (40 mEq total) by mouth daily. 90 tablet 1   spironolactone (ALDACTONE) 25 MG tablet Take 1 tablet (25 mg total) by mouth daily. 90 tablet 1   torsemide (DEMADEX) 20 MG tablet Take 20 mg by mouth daily.     traMADol (ULTRAM) 50 MG tablet Take 1 tablet (50 mg total) by mouth every 12 (twelve) hours as needed for moderate pain. 15 tablet 0   vitamin B-12 (CYANOCOBALAMIN) 500 MCG tablet Take 1 tablet (500 mcg total) by mouth daily. 90 tablet 3   No current facility-administered medications for this visit.     Review of Systems    She denies chest  pain, palpitations, dyspnea, pnd, orthopnea, n, v, dizziness, syncope, edema, weight gain, or early satiety.  All other systems reviewed and are otherwise negative except as noted above.    Physical Exam    VS:  BP 98/70 (BP Location: Left Arm, Patient Position: Sitting, Cuff Size: Large)   Pulse 91   Ht 5\' 2"  (1.575 m)   Wt 190 lb 2 oz (86.2 kg)   SpO2 99%   BMI 34.77 kg/m  , BMI Body mass index is 34.77 kg/m.     GEN: Obese, in no acute distress. HEENT: normal. Neck: Supple, no JVD, carotid bruits, or masses. Cardiac: RRR, 2/6 systolic murmur heard throughout, no rubs or gallops. No clubbing, cyanosis, edema.  Radials/DP/PT 2+ and equal bilaterally.  Respiratory:  Respirations regular and unlabored, clear to auscultation bilaterally. GI: Soft, nontender, nondistended, BS + x 4. MS: no deformity or atrophy. Skin: warm and dry, no rash. Neuro:  Strength and sensation are intact. Psych: Normal affect.  Accessory Clinical Findings    ECG personally reviewed by me today -atrial fibrillation, 91, nonspecific T changes- no acute changes.  Lab Results  Component Value  Date   WBC 4.5 08/26/2021   HGB 10.8 (L) 08/26/2021   HCT 35.2 (L) 08/26/2021   MCV 86.1 08/26/2021   PLT 370 08/26/2021   Lab Results  Component Value Date   CREATININE 1.93 (H) 09/14/2021   BUN 51 (H) 09/14/2021   NA 132 (L) 09/14/2021   K 4.0 09/14/2021   CL 96 (L) 09/14/2021   CO2 27 09/14/2021   Lab Results  Component Value Date   ALT 14 08/25/2021   AST 26 08/25/2021   ALKPHOS 84 08/25/2021   BILITOT 1.6 (H) 08/25/2021   Lab Results  Component Value Date   CHOL 140 07/13/2021   HDL 33 (L) 07/13/2021   LDLCALC 91 07/13/2021   TRIG 84 07/13/2021    Lab Results  Component Value Date   HGBA1C 6.9 (H) 07/13/2021    Assessment & Plan    1.  Chronic heart failure with preserved ejection fraction: Recently admitted to [facility] throughout October and discharged in early November.  Initially due to a misunderstanding with her metolazone, she was having significant hypokalemia and weight loss.  She is euvolemic on exam today and weight is stable at 190 pounds.  I will follow-up a basic metabolic panel since torsemide was just resumed earlier this week.  Heart rate and blood pressure are stable.  Continue current doses of metoprolol, torsemide, and spironolactone.  2.  Hypokalemia: Potassium down to 2.7 following last visit requiring significant potassium supplementation.  Torsemide has since been resumed.  Would recommend continued avoidance of metolazone in the future.  She is currently on potassium 40 mill equivalents daily.  Basic metabolic panel today.  3.  Pericardial effusion: Status post pericardiocentesis in October with moderate effusion on most recent limited echo.  She is scheduled for a repeat limited echo later this month.  Hemodynamically stable and asymptomatic.  4.  Stage III chronic kidney disease: Follow-up basic metabolic panel today.  5.  Permanent atrial fibrillation: Rate controlled on beta-blocker therapy.  She is anticoagulated with Eliquis.  6.   Essential hypertension: Blood pressure remains soft and she is on midodrine.  I recognize that she is also on metoprolol however, this is required for rate control of A. fib.  7.  History of GI bleed: Status post 2 units of packed red blood cells in October during hospitalization.  Stable H&H November 6.  8.  Type 2 diabetes mellitus: A1c 6.9.  Pending renal function, could consider initiation of empagliflozin therapy in the setting of HFpEF, though would also depend on blood pressures and potential tendency towards orthostasis.  9.  Disposition: Follow-up basic metabolic panel today.  She is scheduled for limited echo at the end of this month.  Follow-up here in 1 month or sooner if necessary.   Murray Hodgkins, NP 09/21/2021, 2:53 PM

## 2021-09-22 LAB — BASIC METABOLIC PANEL
BUN/Creatinine Ratio: 14 (ref 12–28)
BUN: 23 mg/dL (ref 8–27)
CO2: 21 mmol/L (ref 20–29)
Calcium: 9.6 mg/dL (ref 8.7–10.3)
Chloride: 103 mmol/L (ref 96–106)
Creatinine, Ser: 1.64 mg/dL — ABNORMAL HIGH (ref 0.57–1.00)
Glucose: 156 mg/dL — ABNORMAL HIGH (ref 70–99)
Potassium: 4.7 mmol/L (ref 3.5–5.2)
Sodium: 140 mmol/L (ref 134–144)
eGFR: 32 mL/min/{1.73_m2} — ABNORMAL LOW (ref >59)

## 2021-09-26 ENCOUNTER — Telehealth (INDEPENDENT_AMBULATORY_CARE_PROVIDER_SITE_OTHER): Payer: Medicare PPO | Admitting: Family Medicine

## 2021-09-26 ENCOUNTER — Encounter: Payer: Self-pay | Admitting: Family Medicine

## 2021-09-26 VITALS — BP 106/70 | HR 96

## 2021-09-26 DIAGNOSIS — E1121 Type 2 diabetes mellitus with diabetic nephropathy: Secondary | ICD-10-CM

## 2021-09-26 MED ORDER — TOUJEO MAX SOLOSTAR 300 UNIT/ML ~~LOC~~ SOPN
26.0000 [IU] | PEN_INJECTOR | Freq: Every day | SUBCUTANEOUS | 3 refills | Status: DC
Start: 2021-09-26 — End: 2021-11-13

## 2021-09-26 NOTE — Progress Notes (Signed)
BP 106/70   Pulse 96    Subjective:    Patient ID: Karen Farley, female    DOB: 1946-08-16, 75 y.o.   MRN: [id number]  HPI: [name] is a 75 y.o. female  Chief Complaint  Patient presents with   Diabetes    Patient states things are still about the same and her sugar levels are still running around the same. Patient states in the mornings it has been 114s-130s and she has had some evenings readings were they were elevated, but she thinks it may be from what she eat.   DIABETES Hypoglycemic episodes:no Polydipsia/polyuria: no Visual disturbance: no Chest pain: no Paresthesias: no Glucose Monitoring: yes  Accucheck frequency: BID  Fasting glucose: 114-130s  Post prandial: 150-197 Taking Insulin?: yes  Long acting insulin: 26 units Blood Pressure Monitoring: not checking Retinal Examination: Up to Date Foot Exam: Up to Date Diabetic Education: Completed Pneumovax: Up to Date Influenza: Up to Date Aspirin: yes  Relevant past medical, surgical, family and social history reviewed and updated as indicated. Interim medical history since our last visit reviewed. Allergies and medications reviewed and updated.  Review of Systems  Constitutional: Negative.   Respiratory: Negative.    Cardiovascular: Negative.   Gastrointestinal: Negative.   Musculoskeletal: Negative.   Neurological: Negative.   Psychiatric/Behavioral: Negative.     Per HPI unless specifically indicated above     Objective:    BP 106/70   Pulse 96   Wt Readings from Last 3 Encounters:  09/21/21 190 lb 2 oz (86.2 kg)  09/20/21 190 lb 6.4 oz (86.4 kg)  09/19/21 189 lb 4 oz (85.8 kg)    Physical Exam Vitals and nursing note reviewed.  Pulmonary:     Effort: Pulmonary effort is normal. No respiratory distress.     Comments: Speaking in full sentences Neurological:     Mental Status: She is alert.  Psychiatric:        Mood and Affect: Mood normal.        Behavior: Behavior normal.         Thought Content: Thought content normal.        Judgment: Judgment normal.    Results for orders placed or performed in visit on 74/94/49  Basic Metabolic Panel (BMET)  Result Value Ref Range   Glucose 156 (H) 70 - 99 mg/dL   BUN 23 8 - 27 mg/dL   Creatinine, Ser 1.64 (H) 0.57 - 1.00 mg/dL   eGFR 32 (L) >59 mL/min/1.73   BUN/Creatinine Ratio 14 12 - 28   Sodium 140 134 - 144 mmol/L   Potassium 4.7 3.5 - 5.2 mmol/L   Chloride 103 96 - 106 mmol/L   CO2 21 20 - 29 mmol/L   Calcium 9.6 8.7 - 10.3 mg/dL      Assessment & Plan:   Problem List Items Addressed This Visit       Endocrine   Type 2 diabetes mellitus with renal complication ([place]) - Primary    Doing better. Will continue current regimen. Continue to monitor. Refills given. Recheck A1c end of Jan/beginning of Feb      Relevant Medications   insulin glargine, 2 Unit Dial, (TOUJEO MAX SOLOSTAR) 300 UNIT/ML Solostar Pen     Follow up plan: Return End of Jan/ beginning of Feb.    This visit was completed via telephone due to the restrictions of the COVID-19 pandemic. All issues as above were discussed and addressed but no physical exam  was performed. If it was felt that the patient should be evaluated in the office, they were directed there. The patient verbally consented to this visit. Patient was unable to complete an audio/visual visit due to Lack of equipment. Due to the catastrophic nature of the COVID-19 pandemic, this visit was done through audio contact only. Location of the patient: home Location of the provider: work Those involved with this call:  Provider: Park Liter, DO CMA: Louanna Raw, CMA Front Desk/Registration: [employer]  Time spent on call:  15 minutes on the phone discussing health concerns. 23 minutes total spent in review of patient's record and preparation of their chart.

## 2021-09-26 NOTE — Progress Notes (Signed)
Pt is scheduled °

## 2021-09-26 NOTE — Assessment & Plan Note (Signed)
Doing better. Will continue current regimen. Continue to monitor. Refills given. Recheck A1c end of Jan/beginning of Feb

## 2021-09-27 DIAGNOSIS — H2512 Age-related nuclear cataract, left eye: Secondary | ICD-10-CM | POA: Diagnosis not present

## 2021-09-29 LAB — ACID FAST CULTURE WITH REFLEXED SENSITIVITIES (MYCOBACTERIA): Acid Fast Culture: NEGATIVE

## 2021-10-02 ENCOUNTER — Ambulatory Visit: Payer: Medicare PPO | Admitting: Family Medicine

## 2021-10-04 ENCOUNTER — Telehealth: Payer: Self-pay | Admitting: Family

## 2021-10-04 ENCOUNTER — Encounter: Payer: Self-pay | Admitting: Ophthalmology

## 2021-10-04 NOTE — Discharge Instructions (Signed)

## 2021-10-04 NOTE — Telephone Encounter (Signed)
Patient called to say that she's had a gradual weight gain this week and is now up almost 2 pounds in the last few days.   12/12: 195.6 lbs 12/13: 196.6 12/14: 196.8 12/15: 197.2  Denies any change in her diet or eating anything salty that she is aware of. Denies any shortness of breath or any swelling in her legs/ abdomen.   Currently taking torsemide 20mg  daily, spironolactone 25mg  daily and potassium 75meq daily. Advised patient to take an additional 20mg  torsemide today and tomorrow only. After these 2 days, she will resume taking just 1 torsemide 20mg  daily.   Patient sees nephrology on 10/08/21 and she can have further diuretic adjustment at that time if needed. Patient able to verbalize these instructions back to me.

## 2021-10-05 ENCOUNTER — Telehealth: Payer: Self-pay

## 2021-10-05 NOTE — Chronic Care Management (AMB) (Signed)
Chronic Care Management   Note  10/05/2021 Name: Karen Farley MRN: [id number] DOB: 13-Dec-1945  Karen Farley is a 75 y.o. year old female who is a primary care patient of Valerie Roys, DO. I reached out to [name] by phone today in response to a referral sent by Ms. Washington PCP.  Ms. Blalock was given information about Chronic Care Management services today including:  CCM service includes personalized support from designated clinical staff supervised by her physician, including individualized plan of care and coordination with other care providers 24/7 contact phone numbers for assistance for urgent and routine care needs. Service will only be billed when office clinical staff spend 20 minutes or more in a month to coordinate care. Only one practitioner may furnish and bill the service in a calendar month. The patient may stop CCM services at any time (effective at the end of the month) by phone call to the office staff. The patient is responsible for co-pay (up to 20% after annual deductible is met) if co-pay is required by the individual health plan.   Patient did not agree to enrollment in care management services and does not wish to consider at this time.  Follow up plan: Patient declines engagement by the care management team. Appropriate care team members and provider have been notified via electronic communication.   Noreene Larsson, [address], [place], [place] [address] Direct Dial: [phone] [email or web address]

## 2021-10-08 DIAGNOSIS — N1832 Chronic kidney disease, stage 3b: Secondary | ICD-10-CM | POA: Diagnosis not present

## 2021-10-08 DIAGNOSIS — I890 Lymphedema, not elsewhere classified: Secondary | ICD-10-CM | POA: Diagnosis not present

## 2021-10-08 DIAGNOSIS — R600 Localized edema: Secondary | ICD-10-CM | POA: Diagnosis not present

## 2021-10-08 DIAGNOSIS — E1122 Type 2 diabetes mellitus with diabetic chronic kidney disease: Secondary | ICD-10-CM | POA: Diagnosis not present

## 2021-10-09 ENCOUNTER — Encounter: Payer: Self-pay | Admitting: Ophthalmology

## 2021-10-09 ENCOUNTER — Other Ambulatory Visit: Payer: Self-pay

## 2021-10-09 ENCOUNTER — Ambulatory Visit: Payer: Medicare PPO | Admitting: Anesthesiology

## 2021-10-09 ENCOUNTER — Ambulatory Visit
Admission: RE | Admit: 2021-10-09 | Discharge: 2021-10-09 | Disposition: A | Discharge status: Home / Self Care | Payer: Medicare PPO | Source: Ambulatory Visit | Attending: Ophthalmology | Admitting: Ophthalmology

## 2021-10-09 ENCOUNTER — Encounter
Admission: RE | Disposition: A | Discharge status: Home / Self Care | Payer: Self-pay | Source: Ambulatory Visit | Attending: Ophthalmology

## 2021-10-09 DIAGNOSIS — H2512 Age-related nuclear cataract, left eye: Secondary | ICD-10-CM | POA: Diagnosis not present

## 2021-10-09 DIAGNOSIS — E1036 Type 1 diabetes mellitus with diabetic cataract: Secondary | ICD-10-CM | POA: Diagnosis not present

## 2021-10-09 DIAGNOSIS — H25812 Combined forms of age-related cataract, left eye: Secondary | ICD-10-CM | POA: Diagnosis not present

## 2021-10-09 HISTORY — PX: CATARACT EXTRACTION W/PHACO: SHX586

## 2021-10-09 LAB — GLUCOSE, CAPILLARY
Glucose-Capillary: 117 mg/dL — ABNORMAL HIGH (ref 70–99)
Glucose-Capillary: 125 mg/dL — ABNORMAL HIGH (ref 70–99)

## 2021-10-09 SURGERY — PHACOEMULSIFICATION, CATARACT, WITH IOL INSERTION
Anesthesia: Monitor Anesthesia Care | Laterality: Left

## 2021-10-09 MED ORDER — MOXIFLOXACIN HCL 0.5 % OP SOLN
OPHTHALMIC | Status: DC | PRN
  Administered 2021-10-09: 0.2 mL via OPHTHALMIC

## 2021-10-09 MED ORDER — ARMC OPHTHALMIC DILATING DROPS
1.0000 "application " | OPHTHALMIC | Status: DC | PRN
  Administered 2021-10-09 (×3): 1 via OPHTHALMIC

## 2021-10-09 MED ORDER — BRIMONIDINE TARTRATE-TIMOLOL 0.2-0.5 % OP SOLN
OPHTHALMIC | Status: DC | PRN
  Administered 2021-10-09: 1 [drp] via OPHTHALMIC

## 2021-10-09 MED ORDER — SIGHTPATH DOSE#1 BSS IO SOLN
INTRAOCULAR | Status: DC | PRN
  Administered 2021-10-09: 1 mL via INTRAMUSCULAR

## 2021-10-09 MED ORDER — SIGHTPATH DOSE#1 BSS IO SOLN
INTRAOCULAR | Status: DC | PRN
  Administered 2021-10-09: 15 mL

## 2021-10-09 MED ORDER — TETRACAINE HCL 0.5 % OP SOLN
1.0000 [drp] | OPHTHALMIC | Status: DC | PRN
  Administered 2021-10-09 (×3): 1 [drp] via OPHTHALMIC

## 2021-10-09 MED ORDER — FENTANYL CITRATE (PF) 100 MCG/2ML IJ SOLN
INTRAMUSCULAR | Status: DC | PRN
  Administered 2021-10-09: 50 ug via INTRAVENOUS

## 2021-10-09 MED ORDER — SIGHTPATH DOSE#1 NA CHONDROIT SULF-NA HYALURON 40-17 MG/ML IO SOLN
INTRAOCULAR | Status: DC | PRN
  Administered 2021-10-09: 1 mL via INTRAOCULAR

## 2021-10-09 MED ORDER — MIDAZOLAM HCL 2 MG/2ML IJ SOLN
INTRAMUSCULAR | Status: DC | PRN
  Administered 2021-10-09: 1 mg via INTRAVENOUS

## 2021-10-09 MED ORDER — SIGHTPATH DOSE#1 BSS IO SOLN
INTRAOCULAR | Status: DC | PRN
  Administered 2021-10-09: 08:00:00 51 mL via OPHTHALMIC

## 2021-10-09 MED ORDER — LACTATED RINGERS IV SOLN: INTRAVENOUS | Status: DC

## 2021-10-09 SURGICAL SUPPLY — 8 items
GLOVE SURG ENC TEXT LTX SZ8 (GLOVE) ×3 IMPLANT
GLOVE SURG TRIUMPH 8.0 PF LTX (GLOVE) ×3 IMPLANT
LENS IOL TECNIS EYHANCE 16.5 (Intraocular Lens) ×2 IMPLANT
NDL FILTER BLUNT 18X1 1/2 (NEEDLE) ×1 IMPLANT
NEEDLE FILTER BLUNT 18X 1/2SAF (NEEDLE) ×2
NEEDLE FILTER BLUNT 18X1 1/2 (NEEDLE) ×1 IMPLANT
SYR 3ML LL SCALE MARK (SYRINGE) ×3 IMPLANT
WATER STERILE IRR 250ML POUR (IV SOLUTION) ×3 IMPLANT

## 2021-10-09 NOTE — Anesthesia Postprocedure Evaluation (Signed)
Anesthesia Post Note  Patient: Karen Farley  Procedure(s) Performed: CATARACT EXTRACTION PHACO AND INTRAOCULAR LENS PLACEMENT (IOC) LEFT DIABETIC 4.84 00:37.8 (Left)     Patient location during evaluation: PACU Anesthesia Type: MAC Level of consciousness: awake and alert Pain management: pain level controlled Vital Signs Assessment: post-procedure vital signs reviewed and stable Respiratory status: spontaneous breathing and nonlabored ventilation Cardiovascular status: unstable Postop Assessment: no apparent nausea or vomiting Anesthetic complications: no   No notable events documented.  Alize Acy [employer]

## 2021-10-09 NOTE — H&P (Signed)
[facility]   Primary Care Physician:  Valerie Roys, DO Ophthalmologist: Dr. George Ina  Pre-Procedure History & Physical: HPI:  Karen Farley is a 75 y.o. female here for cataract surgery.   Past Medical History:  Diagnosis Date   CHF (congestive heart failure) ([place])    Chronic heart failure with preserved ejection fraction (HFpEF) ([place])    a. 11/2019 Echo: EF 60-65%; b. 07/2021 Echo: EF 60-65%.   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 07/2021 req 2u prbcs. Eval deferred given resolution of bleeding and cardiac issues.   Hyperlipidemia    Hypertension    Lymphedema    Morbid obesity ([place])    Osteoarthritis of both knees    Osteopenia    Pericardial effusion    a. 07/2021 Echo: Nl EF. Large pericardial effusion-->s/p pericardiocentesis of 743ml; b. 08/23/2021 Echo: EF 60-65%, mild LVH, Nl RV fxn, sev dil LA, moderate pericardial effusion. L pleural effusion.   Permanent atrial fibrillation (HCC)    Pleural effusion, left    a. 07/2021 s/p thoracentesis - 676ml.    Past Surgical History:  Procedure Laterality Date   CHOLECYSTECTOMY     DILATION AND CURETTAGE OF UTERUS     IR FLUORO GUIDE CV LINE RIGHT  08/16/2021   IR THORACENTESIS ASP PLEURAL SPACE W/IMG GUIDE  08/16/2021   PERICARDIOCENTESIS N/A 08/05/2021   Procedure: PERICARDIOCENTESIS;  Surgeon: Isaias Cowman, MD;  Location: [facility];  Service: Cardiovascular;  Laterality: N/A;   TEAR DUCT PROBING WITH STRABISMUS REPAIR Right    TONSILLECTOMY      Prior to Admission medications   Medication Sig Start Date End Date Taking? Authorizing Provider  apixaban (ELIQUIS) 5 MG TABS tablet Take 1 tablet (5 mg total) by mouth 2 (two) times daily. 04/18/21  Yes Fritzi Mandes, MD  Cholecalciferol  (VITAMIN D3) 125 MCG (5000 UT) TABS Take by mouth.   Yes [provider]  folic acid (FOLVITE) 1 MG tablet Take 1 tablet (1 mg total) by mouth daily. 09/04/21  Yes Johnson, [name], DO  hydrocortisone (ANUSOL-HC) 2.5 % rectal cream Place rectally 3 (three) times daily. 08/28/21  Yes Fritzi Mandes, MD  insulin glargine, 2 Unit Dial, (TOUJEO MAX SOLOSTAR) 300 UNIT/ML Solostar Pen Inject 26 Units into the skin at bedtime. 09/26/21 12/25/21 Yes Johnson, [name], DO  iron polysaccharides (NIFEREX) 150 MG capsule Take 1 capsule (150 mg total) by mouth daily. 08/28/21  Yes Fritzi Mandes, MD  latanoprost (XALATAN) 0.005 % ophthalmic solution Place 1 drop into both eyes at bedtime.   Yes [provider]  linagliptin (TRADJENTA) 5 MG TABS tablet Take 1 tablet (5 mg total) by mouth daily. 09/04/21  Yes Johnson, [name], DO  metoprolol succinate (TOPROL-XL) 25 MG 24 hr tablet Take 0.5 tablets (12.5 mg total) by mouth daily. 09/04/21  Yes Johnson, [name], DO  midodrine (PROAMATINE) 10 MG tablet Take 1 tablet (10 mg total) by mouth 3 (three) times daily with meals. 09/04/21  Yes Johnson, [name], DO  Multiple Vitamin (MULTIVITAMIN WITH MINERALS) TABS tablet Take 1 tablet by mouth daily.   Yes [provider]  pantoprazole (PROTONIX) 40 MG tablet Take 1 tablet (40 mg total) by mouth 2 (two) times daily. 09/04/21  Yes Johnson, [name], DO  polyethylene glycol (MIRALAX / GLYCOLAX) 17 g packet Take 17 g by mouth daily. 08/28/21  Yes Fritzi Mandes, MD  potassium chloride SA (KLOR-CON) 20 MEQ tablet Take 2 tablets (40 mEq total) by mouth daily. 09/11/21 10/11/21 Yes Theora Gianotti, NP  spironolactone (ALDACTONE) 25 MG tablet Take 1 tablet (25 mg total) by mouth daily. 09/04/21  Yes Johnson, [name], DO  torsemide (DEMADEX) 20 MG tablet Take 20 mg by mouth daily.   Yes [provider]  traMADol (ULTRAM) 50 MG tablet Take 1 tablet (50 mg total) by mouth every 12 (twelve) hours as needed  for moderate pain. 08/28/21  Yes Fritzi Mandes, MD  vitamin B-12 (CYANOCOBALAMIN) 500 MCG tablet Take 1 tablet (500 mcg total) by mouth daily. 09/04/21  Yes Johnson, Barb Merino, DO  Elastic Bandages & Supports ([place]) MISC by Does not apply route.    Johnson, [name], DO  Insulin Pen Needle (PEN NEEDLES) 30G X 8 MM MISC 1 each by Does not apply route daily. 09/11/21   Valerie Roys, DO    Allergies as of 09/21/2021   (No Known Allergies)    Family History  Problem Relation Age of Onset   Heart disease Mother        CHF   Osteoporosis Mother    Breast cancer Mother    Heart disease Father 73   Cancer Brother        Colon CA- 2002- Youngest brother   Parkinson's disease Brother        Younger Brother   Heart disease Brother        2 brothers   Cancer Brother    Breast cancer Maternal Grandmother     Social History   Socioeconomic History   Marital status: Married    Spouse name: Not on file   Number of children: Not on file   Years of education: Not on file   Highest education level: Not on file  Occupational History   Not on file  Tobacco Use   Smoking status: Never   Smokeless tobacco: Never  Vaping Use   Vaping Use: Never used  Substance and Sexual Activity   Alcohol use: Not Currently    Alcohol/week: 0.0 standard drinks    Comment: on rare occasion   Drug use: No   Sexual activity: Not Currently  Other Topics Concern   Not on file  Social History Narrative   Not on file   Social Determinants of Health   Financial Resource Strain: Low Risk    Difficulty of Paying Living Expenses: Not hard at all  Food Insecurity: No Food Insecurity   Worried About Charity fundraiser in the Last Year: Never true   [place] in the Last Year: Never true  Transportation Needs: No Transportation Needs   Lack of Transportation (Medical): No   Lack of Transportation (Non-Medical): No  Physical Activity: Sufficiently Active   Days of Exercise per  Week: 7 days   Minutes of Exercise per Session: 30 min  Stress: No Stress Concern Present   Feeling of Stress : Not at all  Social Connections: Moderately Integrated   Frequency of Communication with Friends and Family: More than three times a week   Frequency of Social Gatherings with Friends and Family: Twice a week   Attends Religious Services: 1 to 4 times per year   Active Member of Clubs or Organizations: No  Attends Archivist Meetings: Never   Marital Status: Married  Human resources officer Violence: Not At Risk   Fear of Current or Ex-Partner: No   Emotionally Abused: No   Physically Abused: No   Sexually Abused: No    Review of Systems: See HPI, otherwise negative ROS  Physical Exam: BP (!) 143/78    Pulse (!) 114    Temp 98.1 F (36.7 C)    Ht 5\' 2"  (1.575 m)    Wt 89.8 kg    SpO2 98%    BMI 36.21 kg/m  General:   Alert, cooperative in NAD Head:  Normocephalic and atraumatic. Respiratory:  Normal work of breathing. Cardiovascular:  RRR  Impression/Plan: Karen Farley is here for cataract surgery.  Risks, benefits, limitations, and alternatives regarding cataract surgery have been reviewed with the patient.  Questions have been answered.  All parties agreeable.   Birder Robson, MD  10/09/2021, 7:20 AM

## 2021-10-09 NOTE — Op Note (Signed)
PREOPERATIVE DIAGNOSIS:  Nuclear sclerotic cataract of the left eye.   POSTOPERATIVE DIAGNOSIS:  Nuclear sclerotic cataract of the left eye.   OPERATIVE PROCEDURE:ORPROCALL@   SURGEON:  Birder Robson, MD.   ANESTHESIA:  Anesthesiologist: Ardeth Sportsman, MD CRNA: Silvana Newness, CRNA  1.      Managed anesthesia care. 2.     0.29ml of Shugarcaine was instilled following the paracentesis   COMPLICATIONS:  None.   TECHNIQUE:   Stop and chop   DESCRIPTION OF PROCEDURE:  The patient was examined and consented in the preoperative holding area where the aforementioned topical anesthesia was applied to the left eye and then brought back to the Operating Room where the left eye was prepped and draped in the usual sterile ophthalmic fashion and a lid speculum was placed. A paracentesis was created with the side port blade and the anterior chamber was filled with viscoelastic. A near clear corneal incision was performed with the steel keratome. A continuous curvilinear capsulorrhexis was performed with a cystotome followed by the capsulorrhexis forceps. Hydrodissection and hydrodelineation were carried out with BSS on a blunt cannula. The lens was removed in a stop and chop  technique and the remaining cortical material was removed with the irrigation-aspiration handpiece. The capsular bag was inflated with viscoelastic and the Technis [id number] lens was placed in the capsular bag without complication. The remaining viscoelastic was removed from the eye with the irrigation-aspiration handpiece. The wounds were hydrated. The anterior chamber was flushed with BSS and the eye was inflated to physiologic pressure. 0.98ml Vigamox was placed in the anterior chamber. The wounds were found to be water tight. The eye was dressed with Combigan. The patient was given protective glasses to wear throughout the day and a shield with which to sleep tonight. The patient was also given drops with which to begin a drop regimen  today and will follow-up with me in one day. Implant Name Type Inv. Item Serial No. Manufacturer Lot No. LRB No. Used Action  LENS IOL TECNIS EYHANCE 16.5 - [id number] Intraocular Lens LENS IOL TECNIS EYHANCE 16.5 [id number] JOHNSON   Left 1 Implanted    Procedure(s) with comments: CATARACT EXTRACTION PHACO AND INTRAOCULAR LENS PLACEMENT (IOC) LEFT DIABETIC 4.84 00:37.8 (Left) - Diabetic  Electronically signed: Birder Robson 10/09/2021 7:52 AM

## 2021-10-09 NOTE — Transfer of Care (Signed)
Immediate Anesthesia Transfer of Care Note  Patient: Karen Farley  Procedure(s) Performed: CATARACT EXTRACTION PHACO AND INTRAOCULAR LENS PLACEMENT (IOC) LEFT DIABETIC 4.84 00:37.8 (Left)  Patient Location: PACU  Anesthesia Type: MAC  Level of Consciousness: awake, alert  and patient cooperative  Airway and Oxygen Therapy: Patient Spontanous Breathing and Patient connected to supplemental oxygen  Post-op Assessment: Post-op Vital signs reviewed, Patient's Cardiovascular Status Stable, Respiratory Function Stable, Patent Airway and No signs of Nausea or vomiting  Post-op Vital Signs: Reviewed and stable  Complications: No notable events documented.

## 2021-10-09 NOTE — Anesthesia Preprocedure Evaluation (Signed)
Anesthesia Evaluation  Patient identified by MRN, date of birth, ID band Patient awake    Reviewed: Allergy & Precautions, NPO status , Patient's Chart, lab work & pertinent test results, reviewed documented beta blocker date and time   Airway Mallampati: II  TM Distance: >3 FB Neck ROM: Full    Dental no notable dental hx.    Pulmonary    Pulmonary exam normal        Cardiovascular hypertension, pulmonary hypertension+ Peripheral Vascular Disease and +CHF (Preserved EF, EF 60%)  Normal cardiovascular exam+ dysrhythmias Atrial Fibrillation      Neuro/Psych  Neuromuscular disease negative psych ROS   GI/Hepatic GERD  Medicated and Controlled,  Endo/Other  diabetes, Type 2, Insulin DependentBMI 34  Renal/GU CRFRenal disease     Musculoskeletal  (+) Arthritis , Osteoarthritis,    Abdominal Normal abdominal exam  (+)   Peds  Hematology  (+) anemia ,   Anesthesia Other Findings   Reproductive/Obstetrics                             Anesthesia Physical Anesthesia Plan  ASA: 3  Anesthesia Plan: MAC   Post-op Pain Management:    Induction:   PONV Risk Score and Plan: 2 and TIVA, Midazolam and Treatment may vary due to age or medical condition  Airway Management Planned: Nasal Cannula and Natural Airway  Additional Equipment:   Intra-op Plan:   Post-operative Plan:   Informed Consent: I have reviewed the patients History and Physical, chart, labs and discussed the procedure including the risks, benefits and alternatives for the proposed anesthesia with the patient or authorized representative who has indicated his/her understanding and acceptance.     Dental advisory given  Plan Discussed with: CRNA  Anesthesia Plan Comments:         Anesthesia Quick Evaluation

## 2021-10-10 ENCOUNTER — Encounter: Payer: Self-pay | Admitting: Ophthalmology

## 2021-10-16 ENCOUNTER — Encounter: Payer: Self-pay | Admitting: Ophthalmology

## 2021-10-16 ENCOUNTER — Other Ambulatory Visit: Payer: Medicare PPO

## 2021-10-17 DIAGNOSIS — H2511 Age-related nuclear cataract, right eye: Secondary | ICD-10-CM | POA: Diagnosis not present

## 2021-10-23 ENCOUNTER — Other Ambulatory Visit: Payer: Self-pay | Admitting: Family Medicine

## 2021-10-24 NOTE — Telephone Encounter (Signed)
Requested medication (s) are due for refill today:   Not sure  Requested medication (s) are on the active medication list:   Yes as a historical med  Future visit scheduled:   Yes   Last ordered: Not sure  Returned because last given by a historical provider.   Requested Prescriptions  Pending Prescriptions Disp Refills   torsemide (DEMADEX) 20 MG tablet [Pharmacy Med Name: TORSEMIDE 20MG  TABLETS] 180 tablet     Sig: TAKE 1 TO 2 TABLETS BY MOUTH DAILY. ONLY TAKE 2 TABLETS FOR 1 TO 3 DAYS AND IF PERSISTS, CALL WITH BAD SWELLING     Cardiovascular:  Diuretics - Loop Failed - 10/23/2021 12:47 PM      Failed - Cr in normal range and within 360 days    Creatinine, Ser  Date Value Ref Range Status  09/21/2021 1.64 (H) 0.57 - 1.00 mg/dL Final   Creatinine, Urine  Date Value Ref Range Status  04/11/2021 139 mg/dL Final          Passed - K in normal range and within 360 days    Potassium  Date Value Ref Range Status  09/21/2021 4.7 3.5 - 5.2 mmol/L Final          Passed - Ca in normal range and within 360 days    Calcium  Date Value Ref Range Status  09/21/2021 9.6 8.7 - 10.3 mg/dL Final          Passed - Na in normal range and within 360 days    Sodium  Date Value Ref Range Status  09/21/2021 140 134 - 144 mmol/L Final          Passed - Last BP in normal range    BP Readings from Last 1 Encounters:  10/09/21 117/76          Passed - Valid encounter within last 6 months    Recent Outpatient Visits           4 weeks ago Type 2 diabetes mellitus with diabetic nephropathy, unspecified whether long term insulin use ([place])   [facility] [place], [name], DO   1 month ago Type 2 diabetes mellitus with diabetic nephropathy, unspecified whether long term insulin use ([place])   [place], [name], DO   1 month ago Type 2 diabetes mellitus with diabetic nephropathy, unspecified whether long term insulin use ([place])   [place], [name], DO   1 month ago Type 2 diabetes mellitus with diabetic nephropathy, unspecified whether long term insulin use ([place])   [place], [name], DO   1 month ago [place], [place], DO       Future Appointments             In 3 weeks Johnson, Barb Merino, DO [employer], [place]   In 1 month Gollan, Kathlene November, MD [place], [facility]

## 2021-10-25 ENCOUNTER — Other Ambulatory Visit: Payer: Self-pay | Admitting: Family Medicine

## 2021-10-25 NOTE — Discharge Instructions (Signed)

## 2021-10-25 NOTE — Telephone Encounter (Signed)
Medication Refill - Medication: iron polysaccharides (NIFEREX) 150 MG capsule   Pt only has 2 pills left   Has the patient contacted their pharmacy? Yes.   (Agent: If no, request that the patient contact the pharmacy for the refill. If patient does not wish to contact the pharmacy document the reason why and proceed with request.) (Agent: If yes, when and what did the pharmacy advise?)  Preferred Pharmacy (with phone number or street name):  [facility] [place], [address]  [place] [address]  Phone: [phone] Fax: [phone]   Has the patient been seen for an appointment in the last year OR does the patient have an upcoming appointment? Yes.    Agent: Please be advised that RX refills may take up to 3 business days. We ask that you follow-up with your pharmacy.

## 2021-10-26 ENCOUNTER — Ambulatory Visit: Payer: Medicare PPO | Admitting: Cardiovascular Disease

## 2021-10-26 MED ORDER — POLYSACCHARIDE IRON COMPLEX 150 MG PO CAPS: 150.0000 mg | ORAL_CAPSULE | Freq: Every day | ORAL | 1 refills | Status: DC

## 2021-10-26 NOTE — Telephone Encounter (Signed)
Requested medication (s) are on the active medication list: yes  Future visit scheduled: 11/19/21  Notes to clinic:  prescriber not in this practice, please assess.  Requested Prescriptions  Pending Prescriptions Disp Refills   iron polysaccharides (NIFEREX) 150 MG capsule 30 capsule 1    Sig: Take 1 capsule (150 mg total) by mouth daily.     Off-Protocol Failed - 10/25/2021  5:32 PM      Failed - Medication not assigned to a protocol, review manually.      Passed - Valid encounter within last 12 months    Recent Outpatient Visits           1 month ago Type 2 diabetes mellitus with diabetic nephropathy, unspecified whether long term insulin use ([place])   [place], [name], DO   1 month ago Type 2 diabetes mellitus with diabetic nephropathy, unspecified whether long term insulin use ([place])   [place], [name], DO   1 month ago Type 2 diabetes mellitus with diabetic nephropathy, unspecified whether long term insulin use ([place])   [place], [name], DO   1 month ago Type 2 diabetes mellitus with diabetic nephropathy, unspecified whether long term insulin use ([place])   [place], [name], DO   1 month ago [place], [place], DO       Future Appointments             In 3 weeks Johnson, Barb Merino, DO [employer], [place]   In 14 month Gollan, Kathlene November, MD [place], [place]

## 2021-10-30 ENCOUNTER — Encounter
Admission: RE | Disposition: A | Discharge status: Home / Self Care | Payer: Self-pay | Source: Ambulatory Visit | Attending: Ophthalmology

## 2021-10-30 ENCOUNTER — Ambulatory Visit: Payer: Medicare PPO | Admitting: Anesthesiology

## 2021-10-30 ENCOUNTER — Ambulatory Visit
Admission: RE | Admit: 2021-10-30 | Discharge: 2021-10-30 | Disposition: A | Discharge status: Home / Self Care | Payer: Medicare PPO | Source: Ambulatory Visit | Attending: Ophthalmology | Admitting: Ophthalmology

## 2021-10-30 ENCOUNTER — Encounter: Payer: Self-pay | Admitting: Ophthalmology

## 2021-10-30 DIAGNOSIS — Z794 Long term (current) use of insulin: Secondary | ICD-10-CM | POA: Diagnosis not present

## 2021-10-30 DIAGNOSIS — D631 Anemia in chronic kidney disease: Secondary | ICD-10-CM | POA: Insufficient documentation

## 2021-10-30 DIAGNOSIS — E1051 Type 1 diabetes mellitus with diabetic peripheral angiopathy without gangrene: Secondary | ICD-10-CM | POA: Insufficient documentation

## 2021-10-30 DIAGNOSIS — E1036 Type 1 diabetes mellitus with diabetic cataract: Secondary | ICD-10-CM | POA: Diagnosis not present

## 2021-10-30 DIAGNOSIS — H2511 Age-related nuclear cataract, right eye: Secondary | ICD-10-CM | POA: Diagnosis not present

## 2021-10-30 DIAGNOSIS — N183 Chronic kidney disease, stage 3 unspecified: Secondary | ICD-10-CM | POA: Diagnosis not present

## 2021-10-30 DIAGNOSIS — Z6834 Body mass index (BMI) 34.0-34.9, adult: Secondary | ICD-10-CM | POA: Insufficient documentation

## 2021-10-30 DIAGNOSIS — E1022 Type 1 diabetes mellitus with diabetic chronic kidney disease: Secondary | ICD-10-CM | POA: Diagnosis not present

## 2021-10-30 DIAGNOSIS — H25811 Combined forms of age-related cataract, right eye: Secondary | ICD-10-CM | POA: Diagnosis not present

## 2021-10-30 DIAGNOSIS — I5032 Chronic diastolic (congestive) heart failure: Secondary | ICD-10-CM | POA: Diagnosis not present

## 2021-10-30 DIAGNOSIS — I130 Hypertensive heart and chronic kidney disease with heart failure and stage 1 through stage 4 chronic kidney disease, or unspecified chronic kidney disease: Secondary | ICD-10-CM | POA: Insufficient documentation

## 2021-10-30 HISTORY — PX: CATARACT EXTRACTION W/PHACO: SHX586

## 2021-10-30 LAB — GLUCOSE, CAPILLARY
Glucose-Capillary: 196 mg/dL — ABNORMAL HIGH (ref 70–99)
Glucose-Capillary: 194 mg/dL — ABNORMAL HIGH (ref 70–99)

## 2021-10-30 SURGERY — PHACOEMULSIFICATION, CATARACT, WITH IOL INSERTION
Anesthesia: Monitor Anesthesia Care | Site: Eye | Laterality: Right

## 2021-10-30 MED ORDER — ACETAMINOPHEN 325 MG PO TABS: 325.0000 mg | ORAL_TABLET | ORAL | Status: DC | PRN

## 2021-10-30 MED ORDER — SIGHTPATH DOSE#1 BSS IO SOLN
INTRAOCULAR | Status: DC | PRN
  Administered 2021-10-30: 2 mL

## 2021-10-30 MED ORDER — MIDAZOLAM HCL 2 MG/2ML IJ SOLN
INTRAMUSCULAR | Status: DC | PRN
  Administered 2021-10-30: 1 mg via INTRAVENOUS

## 2021-10-30 MED ORDER — FENTANYL CITRATE (PF) 100 MCG/2ML IJ SOLN
INTRAMUSCULAR | Status: DC | PRN
  Administered 2021-10-30: 50 ug via INTRAVENOUS

## 2021-10-30 MED ORDER — NA CHONDROIT SULF-NA HYALURON 40-17 MG/ML IO SOLN
INTRAOCULAR | Status: DC | PRN
  Administered 2021-10-30: 1 mL via INTRAOCULAR

## 2021-10-30 MED ORDER — SIGHTPATH DOSE#1 BSS IO SOLN
INTRAOCULAR | Status: DC | PRN
  Administered 2021-10-30: 70 mL via OPHTHALMIC

## 2021-10-30 MED ORDER — BRIMONIDINE TARTRATE-TIMOLOL 0.2-0.5 % OP SOLN
OPHTHALMIC | Status: DC | PRN
  Administered 2021-10-30: 1 [drp] via OPHTHALMIC

## 2021-10-30 MED ORDER — ACETAMINOPHEN 160 MG/5ML PO SOLN: 325.0000 mg | ORAL | Status: DC | PRN

## 2021-10-30 MED ORDER — ARMC OPHTHALMIC DILATING DROPS
1.0000 "application " | OPHTHALMIC | Status: DC | PRN
  Administered 2021-10-30 (×3): 1 via OPHTHALMIC

## 2021-10-30 MED ORDER — ONDANSETRON HCL 4 MG/2ML IJ SOLN: 4.0000 mg | Freq: Once | INTRAMUSCULAR | Status: DC | PRN

## 2021-10-30 MED ORDER — MOXIFLOXACIN HCL 0.5 % OP SOLN
OPHTHALMIC | Status: DC | PRN
  Administered 2021-10-30: 0.2 mL via OPHTHALMIC

## 2021-10-30 MED ORDER — SIGHTPATH DOSE#1 BSS IO SOLN
INTRAOCULAR | Status: DC | PRN
  Administered 2021-10-30: 15 mL via INTRAOCULAR

## 2021-10-30 MED ORDER — TETRACAINE HCL 0.5 % OP SOLN
1.0000 [drp] | OPHTHALMIC | Status: DC | PRN
  Administered 2021-10-30 (×3): 1 [drp] via OPHTHALMIC

## 2021-10-30 SURGICAL SUPPLY — 13 items
CANNULA ANT/CHMB 27G (MISCELLANEOUS) IMPLANT
CANNULA ANT/CHMB 27GA (MISCELLANEOUS) IMPLANT
CATARACT SUITE SIGHTPATH (MISCELLANEOUS) ×2 IMPLANT
FEE CATARACT SUITE SIGHTPATH (MISCELLANEOUS) ×1 IMPLANT
GLOVE SURG ENC TEXT LTX SZ8 (GLOVE) ×2 IMPLANT
GLOVE SURG TRIUMPH 8.0 PF LTX (GLOVE) ×2 IMPLANT
LENS IOL TECNIS EYHANCE 19.5 (Intraocular Lens) ×1 IMPLANT
NDL FILTER BLUNT 18X1 1/2 (NEEDLE) ×1 IMPLANT
NEEDLE FILTER BLUNT 18X 1/2SAF (NEEDLE) ×1
NEEDLE FILTER BLUNT 18X1 1/2 (NEEDLE) ×1 IMPLANT
SYR 3ML LL SCALE MARK (SYRINGE) ×2 IMPLANT
TIP ITREPID SGL USE BENT I/A (SUCTIONS) ×1 IMPLANT
WATER STERILE IRR 250ML POUR (IV SOLUTION) ×2 IMPLANT

## 2021-10-30 NOTE — H&P (Signed)
[facility]   Primary Care Physician:  Valerie Roys, DO Ophthalmologist: Dr.Adrianah Prophete  Pre-Procedure History & Physical: HPI:  Karen Farley is a 76 y.o. female here for cataract surgery.   Past Medical History:  Diagnosis Date   CHF (congestive heart failure) ([place])    Chronic heart failure with preserved ejection fraction (HFpEF) ([place])    a. 11/2019 Echo: EF 60-65%; b. 07/2021 Echo: EF 60-65%.   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 07/2021 req 2u prbcs. Eval deferred given resolution of bleeding and cardiac issues.   Hyperlipidemia    Hypertension    Lymphedema    Morbid obesity ([place])    Osteoarthritis of both knees    Osteopenia    Pericardial effusion    a. 07/2021 Echo: Nl EF. Large pericardial effusion-->s/p pericardiocentesis of 723ml; b. 08/23/2021 Echo: EF 60-65%, mild LVH, Nl RV fxn, sev dil LA, moderate pericardial effusion. L pleural effusion.   Permanent atrial fibrillation (HCC)    Pleural effusion, left    a. 07/2021 s/p thoracentesis - 617ml.    Past Surgical History:  Procedure Laterality Date   CATARACT EXTRACTION W/PHACO Left 10/09/2021   Procedure: CATARACT EXTRACTION PHACO AND INTRAOCULAR LENS PLACEMENT (IOC) LEFT DIABETIC 4.84 00:37.8;  Surgeon: Birder Robson, MD;  Location: [facility];  Service: Ophthalmology;  Laterality: Left;  Diabetic   CHOLECYSTECTOMY     DILATION AND CURETTAGE OF UTERUS     IR FLUORO GUIDE CV LINE RIGHT  08/16/2021   IR THORACENTESIS ASP PLEURAL SPACE W/IMG GUIDE  08/16/2021   PERICARDIOCENTESIS N/A 08/05/2021   Procedure: PERICARDIOCENTESIS;  Surgeon: Isaias Cowman, MD;  Location: [facility];  Service: Cardiovascular;  Laterality: N/A;   TEAR DUCT PROBING WITH STRABISMUS REPAIR  Right    TONSILLECTOMY      Prior to Admission medications   Medication Sig Start Date End Date Taking? Authorizing Provider  apixaban (ELIQUIS) 5 MG TABS tablet Take 1 tablet (5 mg total) by mouth 2 (two) times daily. 04/18/21  Yes Fritzi Mandes, MD  Cholecalciferol (VITAMIN D3) 125 MCG (5000 UT) TABS Take by mouth.   Yes [provider]  folic acid (FOLVITE) 1 MG tablet Take 1 tablet (1 mg total) by mouth daily. 09/04/21  Yes Johnson, [name], DO  hydrocortisone (ANUSOL-HC) 2.5 % rectal cream Place rectally 3 (three) times daily. 08/28/21  Yes Fritzi Mandes, MD  insulin glargine, 2 Unit Dial, (TOUJEO MAX SOLOSTAR) 300 UNIT/ML Solostar Pen Inject 26 Units into the skin at bedtime. 09/26/21 12/25/21 Yes Johnson, [name], DO  iron polysaccharides (NIFEREX) 150 MG capsule Take 1 capsule (150 mg total) by mouth daily. 10/26/21  Yes Johnson, [name], DO  latanoprost (XALATAN) 0.005 % ophthalmic solution Place 1 drop into both eyes at bedtime.   Yes [provider]  linagliptin (TRADJENTA) 5 MG TABS tablet Take 1 tablet (5 mg total) by mouth daily. 09/04/21  Yes Johnson, [name], DO  metoprolol succinate (TOPROL-XL) 25 MG 24 hr tablet Take 0.5 tablets (12.5 mg total) by mouth daily. 09/04/21  Yes Johnson, [name], DO  midodrine (PROAMATINE) 10 MG tablet Take 1 tablet (10 mg total) by mouth 3 (three) times daily with meals. 09/04/21  Yes Johnson, [name],  DO  Multiple Vitamin (MULTIVITAMIN WITH MINERALS) TABS tablet Take 1 tablet by mouth daily.   Yes [provider]  pantoprazole (PROTONIX) 40 MG tablet Take 1 tablet (40 mg total) by mouth 2 (two) times daily. 09/04/21  Yes Johnson, [name], DO  polyethylene glycol (MIRALAX / GLYCOLAX) 17 g packet Take 17 g by mouth daily. 08/28/21  Yes Fritzi Mandes, MD  potassium chloride SA (KLOR-CON) 20 MEQ tablet Take 2 tablets (40 mEq total) by mouth daily. 09/11/21 10/30/21 Yes Theora Gianotti, NP  spironolactone (ALDACTONE) 25 MG tablet  Take 1 tablet (25 mg total) by mouth daily. 09/04/21  Yes Johnson, [name], DO  torsemide (DEMADEX) 20 MG tablet TAKE 1 TO 2 TABLETS BY MOUTH DAILY. ONLY TAKE 2 TABLETS FOR 1 TO 3 DAYS AND IF PERSISTS, CALL WITH BAD SWELLING 10/25/21  Yes Johnson, [name], DO  vitamin B-12 (CYANOCOBALAMIN) 500 MCG tablet Take 1 tablet (500 mcg total) by mouth daily. 09/04/21  Yes Johnson, Barb Merino, DO  Elastic Bandages & Supports ([place]) MISC by Does not apply route.    Johnson, [name], DO  Insulin Pen Needle (PEN NEEDLES) 30G X 8 MM MISC 1 each by Does not apply route daily. 09/11/21   Johnson, [name], DO  traMADol (ULTRAM) 50 MG tablet Take 1 tablet (50 mg total) by mouth every 12 (twelve) hours as needed for moderate pain. Patient not taking: Reported on 10/30/2021 08/28/21   Fritzi Mandes, MD    Allergies as of 09/21/2021   (No Known Allergies)    Family History  Problem Relation Age of Onset   Heart disease Mother        CHF   Osteoporosis Mother    Breast cancer Mother    Heart disease Father 46   Cancer Brother        Colon CA- 2002- Youngest brother   Parkinson's disease Brother        Younger Brother   Heart disease Brother        2 brothers   Cancer Brother    Breast cancer Maternal Grandmother     Social History   Socioeconomic History   Marital status: Married    Spouse name: Not on file   Number of children: Not on file   Years of education: Not on file   Highest education level: Not on file  Occupational History   Not on file  Tobacco Use   Smoking status: Never   Smokeless tobacco: Never  Vaping Use   Vaping Use: Never used  Substance and Sexual Activity   Alcohol use: Not Currently    Alcohol/week: 0.0 standard drinks    Comment: on rare occasion   Drug use: No   Sexual activity: Not Currently  Other Topics Concern   Not on file  Social History Narrative   Not on file   Social Determinants of Health   Financial Resource Strain: Low Risk     Difficulty of Paying Living Expenses: Not hard at all  Food Insecurity: No Food Insecurity   Worried About Charity fundraiser in the Last Year: Never true   [place] in the Last Year: Never true  Transportation Needs: No Transportation Needs   Lack of Transportation (Medical): No   Lack of Transportation (Non-Medical): No  Physical Activity: Sufficiently Active   Days of Exercise per Week: 7 days   Minutes of Exercise per Session: 30 min  Stress: No Stress  Concern Present   Feeling of Stress : Not at all  Social Connections: Moderately Integrated   Frequency of Communication with Friends and Family: More than three times a week   Frequency of Social Gatherings with Friends and Family: Twice a week   Attends Religious Services: 1 to 4 times per year   Active Member of [employer] or Organizations: No   Attends Music therapist: Never   Marital Status: Married  Human resources officer Violence: Not At Risk   Fear of Current or Ex-Partner: No   Emotionally Abused: No   Physically Abused: No   Sexually Abused: No    Review of Systems: See HPI, otherwise negative ROS  Physical Exam: BP 116/88    Pulse 85    Temp 98.1 F (36.7 C) (Temporal)    Ht 5\' 2"  (1.575 m)    Wt 84.4 kg    SpO2 99%    BMI 34.02 kg/m  General:   Alert, cooperative in NAD Head:  Normocephalic and atraumatic. Respiratory:  Normal work of breathing. Cardiovascular:  RRR  Impression/Plan: FLORDIA KASSEM is here for cataract surgery.  Risks, benefits, limitations, and alternatives regarding cataract surgery have been reviewed with the patient.  Questions have been answered.  All parties agreeable.   Birder Robson, MD  10/30/2021, 9:34 AM

## 2021-10-30 NOTE — Op Note (Signed)
PREOPERATIVE DIAGNOSIS:  Nuclear sclerotic cataract of the right eye.   POSTOPERATIVE DIAGNOSIS:  Cataract   OPERATIVE PROCEDURE:ORPROCALL@   SURGEON:  Birder Robson, MD.   ANESTHESIA:  Anesthesiologist: Veda Canning, MD CRNA: Silvana Newness, CRNA  1.      Managed anesthesia care. 2.      0.77ml of Shugarcaine was instilled in the eye following the paracentesis.   COMPLICATIONS:  None.   TECHNIQUE:   Stop and chop   DESCRIPTION OF PROCEDURE:  The patient was examined and consented in the preoperative holding area where the aforementioned topical anesthesia was applied to the right eye and then brought back to the Operating Room where the right eye was prepped and draped in the usual sterile ophthalmic fashion and a lid speculum was placed. A paracentesis was created with the side port blade and the anterior chamber was filled with viscoelastic. A near clear corneal incision was performed with the steel keratome. A continuous curvilinear capsulorrhexis was performed with a cystotome followed by the capsulorrhexis forceps. Hydrodissection and hydrodelineation were carried out with BSS on a blunt cannula. The lens was removed in a stop and chop  technique and the remaining cortical material was removed with the irrigation-aspiration handpiece. The capsular bag was inflated with viscoelastic and the Technis [id number]  lens was placed in the capsular bag without complication. The remaining viscoelastic was removed from the eye with the irrigation-aspiration handpiece. The wounds were hydrated. The anterior chamber was flushed with BSS and the eye was inflated to physiologic pressure. 0.25ml of Vigamox was placed in the anterior chamber. The wounds were found to be water tight. The eye was dressed with Combigan. The patient was given protective glasses to wear throughout the day and a shield with which to sleep tonight. The patient was also given drops with which to begin a drop regimen today and will  follow-up with me in one day. Implant Name Type Inv. Item Serial No. Manufacturer Lot No. LRB No. Used Action  LENS IOL TECNIS EYHANCE 19.5 - [id number] Intraocular Lens LENS IOL TECNIS EYHANCE 19.5 [id number] [employer]  Right 1 Implanted   Procedure(s) with comments: CATARACT EXTRACTION PHACO AND INTRAOCULAR LENS PLACEMENT (IOC) RIGHT DIABETIC 5.65 00:38.5 (Right) - Diabetic  Electronically signed: Birder Robson 10/30/2021 10:01 AM

## 2021-10-30 NOTE — Anesthesia Preprocedure Evaluation (Signed)
Anesthesia Evaluation  Patient identified by MRN, date of birth, ID band Patient awake    Reviewed: Allergy & Precautions, NPO status   Airway Mallampati: II  TM Distance: >3 FB Neck ROM: Full    Dental no notable dental hx.    Pulmonary    Pulmonary exam normal        Cardiovascular hypertension, pulmonary hypertension+ Peripheral Vascular Disease and +CHF (Preserved EF, EF 60%)  Normal cardiovascular exam+ dysrhythmias Atrial Fibrillation      Neuro/Psych  Neuromuscular disease    GI/Hepatic GERD  Medicated and Controlled,  Endo/Other  diabetes, Type 2, Insulin DependentBMI 34  Renal/GU CRFRenal disease     Musculoskeletal  (+) Arthritis , Osteoarthritis,    Abdominal   Peds  Hematology  (+) anemia ,   Anesthesia Other Findings   Reproductive/Obstetrics                             Anesthesia Physical  Anesthesia Plan  ASA: 3  Anesthesia Plan: MAC   Post-op Pain Management: Minimal or no pain anticipated   Induction:   PONV Risk Score and Plan: 2 and TIVA, Midazolam and Treatment may vary due to age or medical condition  Airway Management Planned: Nasal Cannula and Natural Airway  Additional Equipment:   Intra-op Plan:   Post-operative Plan:   Informed Consent: I have reviewed the patients History and Physical, chart, labs and discussed the procedure including the risks, benefits and alternatives for the proposed anesthesia with the patient or authorized representative who has indicated his/her understanding and acceptance.     Dental advisory given  Plan Discussed with: CRNA  Anesthesia Plan Comments:         Anesthesia Quick Evaluation

## 2021-10-30 NOTE — Transfer of Care (Signed)
Immediate Anesthesia Transfer of Care Note  Patient: Karen Farley  Procedure(s) Performed: CATARACT EXTRACTION PHACO AND INTRAOCULAR LENS PLACEMENT (IOC) RIGHT DIABETIC 5.65 00:38.5 (Right: Eye)  Patient Location: PACU  Anesthesia Type: MAC  Level of Consciousness: awake, alert  and patient cooperative  Airway and Oxygen Therapy: Patient Spontanous Breathing and Patient connected to supplemental oxygen  Post-op Assessment: Post-op Vital signs reviewed, Patient's Cardiovascular Status Stable, Respiratory Function Stable, Patent Airway and No signs of Nausea or vomiting  Post-op Vital Signs: Reviewed and stable  Complications: No notable events documented.

## 2021-10-30 NOTE — Anesthesia Postprocedure Evaluation (Signed)
Anesthesia Post Note  Patient: Karen Farley  Procedure(s) Performed: CATARACT EXTRACTION PHACO AND INTRAOCULAR LENS PLACEMENT (IOC) RIGHT DIABETIC 5.65 00:38.5 (Right: Eye)     Patient location during evaluation: PACU Anesthesia Type: MAC Level of consciousness: awake Pain management: pain level controlled Vital Signs Assessment: post-procedure vital signs reviewed and stable Respiratory status: respiratory function stable Cardiovascular status: stable Postop Assessment: no apparent nausea or vomiting Anesthetic complications: no   No notable events documented.  Veda Canning

## 2021-10-31 ENCOUNTER — Encounter: Payer: Self-pay | Admitting: Ophthalmology

## 2021-11-13 ENCOUNTER — Other Ambulatory Visit: Payer: Self-pay | Admitting: Family Medicine

## 2021-11-13 MED ORDER — TOUJEO MAX SOLOSTAR 300 UNIT/ML ~~LOC~~ SOPN: 26.0000 [IU] | PEN_INJECTOR | Freq: Every day | SUBCUTANEOUS | 4 refills | Status: DC

## 2021-11-13 NOTE — Telephone Encounter (Signed)
[place] called and spoke to [employer], [employer] about the refill(s) Pen Needles requested. Advised it was sent on 09/11/21 #100 each/12 refill(s) and patient is saying she is running out because she's only receiving 20 units instead of 25 units, calling for clarity.  She says the patient is talking about her insulin. She says they did receive the Rx for 100 pens and the patient picked up in November and she will refill another one today. She asked what is the dose for the insulin. I advised inject 26 units QHS. She says they will need a new Rx because the one they have is for 20 units. I advised it will be resent electronically, since they did not receive the one faxed in on 09/26/21.

## 2021-11-13 NOTE — Telephone Encounter (Signed)
Requested Prescriptions  Pending Prescriptions Disp Refills   insulin glargine, 2 Unit Dial, (TOUJEO MAX SOLOSTAR) 300 UNIT/ML Solostar Pen 3 mL 3    Sig: Inject 26 Units into the skin at bedtime.     Endocrinology:  Diabetes - Insulins Passed - 11/13/2021  1:21 PM      Passed - HBA1C is between 0 and 7.9 and within 180 days    Hemoglobin A1C  Date Value Ref Range Status  06/28/2016 6.6  Final   HB A1C (BAYER DCA - WAIVED)  Date Value Ref Range Status  07/13/2021 6.9 (H) 4.8 - 5.6 % Final    Comment:             Prediabetes: 5.7 - 6.4          Diabetes: >6.4          Glycemic control for adults with diabetes: <7.0               **Please note reference interval change**          Passed - Valid encounter within last 6 months    Recent Outpatient Visits          1 month ago Type 2 diabetes mellitus with diabetic nephropathy, unspecified whether long term insulin use ([place])   [place], [name], DO   1 month ago Type 2 diabetes mellitus with diabetic nephropathy, unspecified whether long term insulin use ([place])   [place], [name], DO   1 month ago Type 2 diabetes mellitus with diabetic nephropathy, unspecified whether long term insulin use ([place])   [place], [name], DO   2 months ago Type 2 diabetes mellitus with diabetic nephropathy, unspecified whether long term insulin use ([place])   [place], Wallace, DO   2 months ago [place], [place], DO      Future Appointments            In 6 days Wynetta Emery, Barb Merino, DO [employer], [place]   In 1 week Gollan, Kathlene November, MD [facility], [place]

## 2021-11-13 NOTE — Telephone Encounter (Signed)
Pt stated she is running out of medication, Insulin Pen Needle (PEN NEEDLES) 30G X 8 MM MISC because the pharmacy is only giving her 20 units instead of 25 units. PT requested that we contact the pharmacy to clarify.   [id number] [address], [place] AT [facility] OF [address]  [place] [address]  Phone: [phone] Fax: [phone]  Hours: Not open 24 hours

## 2021-11-14 ENCOUNTER — Ambulatory Visit (INDEPENDENT_AMBULATORY_CARE_PROVIDER_SITE_OTHER): Payer: Medicare PPO

## 2021-11-14 ENCOUNTER — Other Ambulatory Visit: Payer: Self-pay

## 2021-11-14 DIAGNOSIS — I3139 Other pericardial effusion (noninflammatory): Secondary | ICD-10-CM

## 2021-11-14 LAB — ECHOCARDIOGRAM LIMITED
MV M vel: 4.87 m/s
MV Peak grad: 94.9 mmHg
S' Lateral: 2.5 cm

## 2021-11-15 ENCOUNTER — Telehealth: Payer: Self-pay | Admitting: Family Medicine

## 2021-11-15 NOTE — Telephone Encounter (Signed)
Pt called and stated that she is unable to pick up test strips from pharmacy because quantity is incorrect on RX. Pt states that she is testing twice a day and ran out. Insurance will not cover until this has changed. Please advise.

## 2021-11-19 ENCOUNTER — Other Ambulatory Visit: Payer: Self-pay

## 2021-11-19 ENCOUNTER — Ambulatory Visit: Payer: Medicare PPO | Admitting: Family Medicine

## 2021-11-19 ENCOUNTER — Encounter: Payer: Self-pay | Admitting: Family Medicine

## 2021-11-19 VITALS — BP 95/66 | HR 93 | Wt 190.0 lb

## 2021-11-19 DIAGNOSIS — I129 Hypertensive chronic kidney disease with stage 1 through stage 4 chronic kidney disease, or unspecified chronic kidney disease: Secondary | ICD-10-CM

## 2021-11-19 DIAGNOSIS — N1832 Chronic kidney disease, stage 3b: Secondary | ICD-10-CM | POA: Diagnosis not present

## 2021-11-19 DIAGNOSIS — R609 Edema, unspecified: Secondary | ICD-10-CM | POA: Diagnosis not present

## 2021-11-19 DIAGNOSIS — E782 Mixed hyperlipidemia: Secondary | ICD-10-CM | POA: Diagnosis not present

## 2021-11-19 DIAGNOSIS — E1121 Type 2 diabetes mellitus with diabetic nephropathy: Secondary | ICD-10-CM | POA: Diagnosis not present

## 2021-11-19 LAB — MICROALBUMIN, URINE WAIVED
Creatinine, Urine Waived: 100 mg/dL (ref 10–300)
Microalb, Ur Waived: 10 mg/L (ref 0–19)
Microalb/Creat Ratio: <30 mg/g (ref <30)

## 2021-11-19 LAB — BAYER DCA HB A1C WAIVED: HB A1C (BAYER DCA - WAIVED): 9.6 % — ABNORMAL HIGH (ref 4.8–5.6)

## 2021-11-19 MED ORDER — SPIRONOLACTONE 25 MG PO TABS: 25.0000 mg | ORAL_TABLET | Freq: Every day | ORAL | 1 refills | Status: DC

## 2021-11-19 MED ORDER — EMPAGLIFLOZIN 25 MG PO TABS: 25.0000 mg | ORAL_TABLET | Freq: Every day | ORAL | 3 refills | Status: DC

## 2021-11-19 MED ORDER — METOPROLOL SUCCINATE ER 25 MG PO TB24: 12.5000 mg | ORAL_TABLET | Freq: Every day | ORAL | 1 refills | Status: DC

## 2021-11-19 MED ORDER — MIDODRINE HCL 10 MG PO TABS
10.0000 mg | ORAL_TABLET | Freq: Three times a day (TID) | ORAL | 1 refills | Status: DC
Start: 2021-11-19 — End: 2022-05-21

## 2021-11-19 NOTE — Assessment & Plan Note (Signed)
Under good control on current regimen. Continue current regimen. Continue to monitor. Call with any concerns. Refills given. Labs checked today.  

## 2021-11-19 NOTE — Assessment & Plan Note (Signed)
Rechecking labs today. Await results. Treat as needed. Would benefit from statin.

## 2021-11-19 NOTE — Assessment & Plan Note (Signed)
Chronic and multifactorial. On several diuretics right now. Seeing nephrology tomorrow. Will await their input.

## 2021-11-19 NOTE — Assessment & Plan Note (Signed)
Not under good control with A1c of 9.6. Will stop tradjenta and start jardiance. Recheck tolerance in 1 month. May need to adjust her sugars too.

## 2021-11-19 NOTE — Assessment & Plan Note (Signed)
Rechecking labs today. Await results. Treat as needed.  °

## 2021-11-19 NOTE — Progress Notes (Signed)
BP 95/66    Pulse 93    Wt 190 lb (86.2 kg)    SpO2 96%    BMI 34.75 kg/m    Subjective:    Patient ID: Karen Farley, female    DOB: Apr 27, 1946, 76 y.o.   MRN: [id number]  HPI: Karen Farley is a 76 y.o. female  Chief Complaint  Patient presents with   Diabetes   Hyperlipidemia   Hypertension   Edema    Patient states she is retaining fluid in her legs, fingers, and face    Edema- known anasarca and lymphedema. Following with CHF clinic and nephrology. Seeing nephrology again tomorrow. She notes that she has put on some fluid (2lbs) and has been having a lot of trouble getting it down.   DIABETES Hypoglycemic episodes:no Polydipsia/polyuria: no Visual disturbance: no Chest pain: no Paresthesias: no Glucose Monitoring: yes  Accucheck frequency: BID Taking Insulin?: yes Blood Pressure Monitoring: rarely Retinal Examination: Up to Date Foot Exam: Up to Date Diabetic Education: Completed Pneumovax: Up to Date Influenza: Up to Date Aspirin: no  HYPERTENSION / [place] Satisfied with current treatment? yes Duration of hypertension: chronic BP monitoring frequency: rarely BP medication side effects: no Duration of hyperlipidemia: chronic Cholesterol medication side effects: not on anything Cholesterol supplements: none Past cholesterol medications: none Medication compliance: excellent compliance Aspirin: no Recent stressors: no Recurrent headaches: no Visual changes: no Palpitations: no Dyspnea: no Chest pain: no Lower extremity edema: yes Dizzy/lightheaded: no   Relevant past medical, surgical, family and social history reviewed and updated as indicated. Interim medical history since our last visit reviewed. Allergies and medications reviewed and updated.  Review of Systems  Constitutional:  Positive for fatigue. Negative for activity change, appetite change, chills, diaphoresis, fever and unexpected weight change.  Respiratory: Negative.     Cardiovascular:  Positive for leg swelling. Negative for chest pain and palpitations.  Gastrointestinal: Negative.   Musculoskeletal: Negative.   Neurological: Negative.   Psychiatric/Behavioral: Negative.     Per HPI unless specifically indicated above     Objective:    BP 95/66    Pulse 93    Wt 190 lb (86.2 kg)    SpO2 96%    BMI 34.75 kg/m   Wt Readings from Last 3 Encounters:  11/19/21 190 lb (86.2 kg)  10/30/21 186 lb (84.4 kg)  10/09/21 198 lb (89.8 kg)    Physical Exam Vitals and nursing note reviewed.  Constitutional:      General: She is not in acute distress.    Appearance: Normal appearance. She is not ill-appearing, toxic-appearing or diaphoretic.  HENT:     Head: Normocephalic and atraumatic.     Right Ear: External ear normal.     Left Ear: External ear normal.     Nose: Nose normal.     Mouth/Throat:     Mouth: Mucous membranes are moist.     Pharynx: Oropharynx is clear.  Eyes:     General: No scleral icterus.       Right eye: No discharge.        Left eye: No discharge.     Extraocular Movements: Extraocular movements intact.     Conjunctiva/sclera: Conjunctivae normal.     Pupils: Pupils are equal, round, and reactive to light.  Cardiovascular:     Rate and Rhythm: Normal rate and regular rhythm.     Pulses: Normal pulses.     Heart sounds: Murmur heard.    No friction rub. No  gallop.  Pulmonary:     Effort: Pulmonary effort is normal. No respiratory distress.     Breath sounds: Normal breath sounds. No stridor. No wheezing, rhonchi or rales.  Chest:     Chest wall: No tenderness.  Musculoskeletal:        General: Normal range of motion.     Cervical back: Normal range of motion and neck supple.     Right lower leg: Edema present.     Left lower leg: Edema present.  Skin:    General: Skin is warm and dry.     Capillary Refill: Capillary refill takes less than 2 seconds.     Coloration: Skin is not jaundiced or pale.     Findings: No  bruising, erythema, lesion or rash.  Neurological:     General: No focal deficit present.     Mental Status: She is alert and oriented to person, place, and time. Mental status is at baseline.  Psychiatric:        Mood and Affect: Mood normal.        Behavior: Behavior normal.        Thought Content: Thought content normal.        Judgment: Judgment normal.    Results for orders placed or performed in visit on 11/14/21  ECHOCARDIOGRAM LIMITED  Result Value Ref Range   S' Lateral 2.50 cm   MV M vel 4.87 m/s   MV Peak grad 94.9 mmHg      Assessment & Plan:   Problem List Items Addressed This Visit       Endocrine   Type 2 diabetes mellitus ([place]) - Primary    Not under good control with A1c of 9.6. Will stop tradjenta and start jardiance. Recheck tolerance in 1 month. May need to adjust her sugars too.       Relevant Medications   empagliflozin (JARDIANCE) 25 MG TABS tablet   Other Relevant Orders   Bayer DCA Hb A1c Waived   Comprehensive metabolic panel   CBC with Differential/Platelet   Microalbumin, Urine Waived     Genitourinary   Benign hypertensive renal disease    Under good control on current regimen. Continue current regimen. Continue to monitor. Call with any concerns. Refills given. Labs checked today.        Relevant Orders   Comprehensive metabolic panel   CBC with Differential/Platelet   Microalbumin, Urine Waived   Stage 3b chronic kidney disease ([place])    Rechecking labs today. Await results. Treat as needed.         Other   Hyperlipidemia    Rechecking labs today. Await results. Treat as needed. Would benefit from statin.      Relevant Medications   metolazone (ZAROXOLYN) 2.5 MG tablet   spironolactone (ALDACTONE) 25 MG tablet   midodrine (PROAMATINE) 10 MG tablet   metoprolol succinate (TOPROL-XL) 25 MG 24 hr tablet   Other Relevant Orders   Comprehensive metabolic panel   CBC with Differential/Platelet   Lipid Panel w/o Chol/HDL Ratio    Edema    Chronic and multifactorial. On several diuretics right now. Seeing nephrology tomorrow. Will await their input.         Follow up plan: Return in about 4 weeks (around 12/17/2021) for virtual OK.

## 2021-11-20 DIAGNOSIS — R600 Localized edema: Secondary | ICD-10-CM | POA: Diagnosis not present

## 2021-11-20 DIAGNOSIS — E876 Hypokalemia: Secondary | ICD-10-CM | POA: Diagnosis not present

## 2021-11-20 DIAGNOSIS — I890 Lymphedema, not elsewhere classified: Secondary | ICD-10-CM | POA: Diagnosis not present

## 2021-11-20 DIAGNOSIS — E1122 Type 2 diabetes mellitus with diabetic chronic kidney disease: Secondary | ICD-10-CM | POA: Diagnosis not present

## 2021-11-20 DIAGNOSIS — N184 Chronic kidney disease, stage 4 (severe): Secondary | ICD-10-CM | POA: Diagnosis not present

## 2021-11-20 LAB — COMPREHENSIVE METABOLIC PANEL
ALT: 11 IU/L (ref 0–32)
AST: 14 IU/L (ref 0–40)
Albumin/Globulin Ratio: 1.6 (ref 1.2–2.2)
Albumin: 4.1 g/dL (ref 3.7–4.7)
Alkaline Phosphatase: 110 IU/L (ref 44–121)
BUN/Creatinine Ratio: 21 (ref 12–28)
BUN: 49 mg/dL — ABNORMAL HIGH (ref 8–27)
Bilirubin Total: 1 mg/dL (ref 0.0–1.2)
CO2: 29 mmol/L (ref 20–29)
Calcium: 9.7 mg/dL (ref 8.7–10.3)
Chloride: 91 mmol/L — ABNORMAL LOW (ref 96–106)
Creatinine, Ser: 2.31 mg/dL — ABNORMAL HIGH (ref 0.57–1.00)
Globulin, Total: 2.5 g/dL (ref 1.5–4.5)
Glucose: 328 mg/dL — ABNORMAL HIGH (ref 70–99)
Potassium: 3.4 mmol/L — ABNORMAL LOW (ref 3.5–5.2)
Sodium: 138 mmol/L (ref 134–144)
Total Protein: 6.6 g/dL (ref 6.0–8.5)
eGFR: 22 mL/min/{1.73_m2} — ABNORMAL LOW (ref >59)

## 2021-11-20 LAB — CBC WITH DIFFERENTIAL/PLATELET
Basophils Absolute: 0.1 10*3/uL (ref 0.0–0.2)
Basos: 1 %
EOS (ABSOLUTE): 0.1 10*3/uL (ref 0.0–0.4)
Eos: 2 %
Hematocrit: 41 % (ref 34.0–46.6)
Hemoglobin: 12.3 g/dL (ref 11.1–15.9)
Immature Grans (Abs): 0 10*3/uL (ref 0.0–0.1)
Immature Granulocytes: 0 %
Lymphocytes Absolute: 0.4 10*3/uL — ABNORMAL LOW (ref 0.7–3.1)
Lymphs: 6 %
MCH: 23.2 pg — ABNORMAL LOW (ref 26.6–33.0)
MCHC: 30 g/dL — ABNORMAL LOW (ref 31.5–35.7)
MCV: 77 fL — ABNORMAL LOW (ref 79–97)
Monocytes Absolute: 0.5 10*3/uL (ref 0.1–0.9)
Monocytes: 8 %
Neutrophils Absolute: 5.6 10*3/uL (ref 1.4–7.0)
Neutrophils: 83 %
Platelets: 309 10*3/uL (ref 150–450)
RBC: 5.31 x10E6/uL — ABNORMAL HIGH (ref 3.77–5.28)
RDW: 17.3 % — ABNORMAL HIGH (ref 11.7–15.4)
WBC: 6.7 10*3/uL (ref 3.4–10.8)

## 2021-11-20 LAB — LIPID PANEL W/O CHOL/HDL RATIO
Cholesterol, Total: 194 mg/dL (ref 100–199)
HDL: 39 mg/dL — ABNORMAL LOW (ref >39)
LDL Chol Calc (NIH): 133 mg/dL — ABNORMAL HIGH (ref 0–99)
Triglycerides: 121 mg/dL (ref 0–149)
VLDL Cholesterol Cal: 22 mg/dL (ref 5–40)

## 2021-11-20 NOTE — Telephone Encounter (Signed)
Can we please write up a Rx for BID testing?

## 2021-11-20 NOTE — Telephone Encounter (Signed)
RX placed in folder for signature.

## 2021-11-23 ENCOUNTER — Other Ambulatory Visit: Payer: Self-pay | Admitting: Family Medicine

## 2021-11-23 DIAGNOSIS — N179 Acute kidney failure, unspecified: Secondary | ICD-10-CM

## 2021-11-25 NOTE — Progress Notes (Signed)
Cardiology Office Note  Date:  11/26/2021   ID:  IVER FEHRENBACH, DOB [birth year], MRN [id number]  PCP:  Valerie Roys, DO   Chief Complaint  Patient presents with   1 month follow up    "Doing well." Medications reviewed by the patient verbally.     HPI:  76 year old female with a history of  HFpEF,  hypertension,  stage III chronic kidney disease,  lymphedema,  permanent atrial fibrillation,  hyperlipidemia,  diabetes,  pericardial effusion status post pericardiocentesis,  pleural effusion status post thoracentesis, and  GI bleed,  who presents for follow-up of heart failure and hypokalemia.  Admitted to the hospital October 2022, large left pleural effusion requiring thoracentesis Rectal bleed, Eliquis discontinued, required transfusion Large pericardial effusion noted, no tamponade on echo Pericardiocentesis performed 700 out Follow-up echo stable moderate effusion Required diuresis, transition to torsemide metolazone, Eliquis resumed  In follow-up today, she reports doing much better Hemoglobin improving now greater than 12 More energy, walking with a walker outside the house.  Inside the house no walker Legs feeling stronger, denies shortness of breath no chest pain Reports leg swelling much improved  For unclear reasons,Sugars running high, On insulin, started on Jardiance  Lab work reviewed Total chol 194 HGb12.3 CR 2.31 A1c 9.6, up from 6.9  Taking Torsemide 20 daily Metolazone M/W/Fri, now to BID by nephrology has creatinine running high  EKG personally reviewed by myself on todays visit Atrial fibrillation 92 bpm no significant ST or T wave changes   PMH:   has a past medical history of CHF (congestive heart failure) ([place]), Chronic heart failure with preserved ejection fraction (HFpEF) ([place]), 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  PSH:    Past Surgical History:  Procedure Laterality Date   CATARACT EXTRACTION W/PHACO Left 10/09/2021   Procedure: CATARACT EXTRACTION PHACO AND INTRAOCULAR LENS PLACEMENT (IOC) LEFT DIABETIC 4.84 00:37.8;  Surgeon: Birder Robson, MD;  Location: [facility] [place];  Service: Ophthalmology;  Laterality: Left;  Diabetic   CATARACT EXTRACTION W/PHACO Right 10/30/2021   Procedure: CATARACT EXTRACTION PHACO AND INTRAOCULAR LENS PLACEMENT (IOC) RIGHT DIABETIC 5.65 00:38.5;  Surgeon: Birder Robson, MD;  Location: [facility];  Service: Ophthalmology;  Laterality: Right;  Diabetic   CHOLECYSTECTOMY     DILATION AND CURETTAGE OF UTERUS     IR FLUORO GUIDE CV LINE RIGHT  08/16/2021   IR THORACENTESIS ASP PLEURAL SPACE W/IMG GUIDE  08/16/2021   PERICARDIOCENTESIS N/A 08/05/2021   Procedure: PERICARDIOCENTESIS;  Surgeon: Isaias Cowman, MD;  Location: [facility];  Service: Cardiovascular;  Laterality: N/A;   TEAR DUCT PROBING WITH STRABISMUS REPAIR Right    TONSILLECTOMY      Current Outpatient Medications  Medication Sig Dispense Refill   ACCU-CHEK GUIDE test strip USE TO CHECK BLOOD SUGAR DAILY     apixaban (ELIQUIS) 5 MG TABS tablet Take 1 tablet (5 mg total) by mouth 2 (two) times daily. 180 tablet 3   Cholecalciferol (VITAMIN D3) 125 MCG (5000 UT) TABS Take by mouth.     Elastic Bandages & Supports (MEDICAL COMPRESSION STOCKINGS) MISC by Does not apply route.     empagliflozin (JARDIANCE) 25 MG TABS tablet Take 1 tablet (25 mg total) by mouth daily before breakfast.  30 tablet 3   folic acid (FOLVITE) 1 MG tablet Take 1 tablet (1 mg total) by mouth daily. 90 tablet 3   insulin glargine, 2 Unit Dial, (TOUJEO MAX SOLOSTAR)  300 UNIT/ML Solostar Pen Inject 26 Units into the skin at bedtime. 3 mL 4   Insulin Pen Needle (PEN NEEDLES) 30G X 8 MM MISC 1 each by Does not apply route daily. 100 each 12   iron polysaccharides (NIFEREX) 150 MG capsule Take 1 capsule (150 mg total) by mouth daily. 30 capsule 1   latanoprost (XALATAN) 0.005 % ophthalmic solution Place 1 drop into both eyes at bedtime.     metolazone (ZAROXOLYN) 2.5 MG tablet Take 1 tablet by mouth every Monday, Wednesday, and Friday.     metoprolol succinate (TOPROL-XL) 25 MG 24 hr tablet Take 0.5 tablets (12.5 mg total) by mouth daily. 90 tablet 1   midodrine (PROAMATINE) 10 MG tablet Take 1 tablet (10 mg total) by mouth 3 (three) times daily with meals. 270 tablet 1   Multiple Vitamin (MULTIVITAMIN WITH MINERALS) TABS tablet Take 1 tablet by mouth daily.     pantoprazole (PROTONIX) 40 MG tablet Take 1 tablet (40 mg total) by mouth 2 (two) times daily. 180 tablet 3   polyethylene glycol (MIRALAX / GLYCOLAX) 17 g packet Take 17 g by mouth daily. 14 each 0   potassium chloride SA (KLOR-CON) 20 MEQ tablet Take 2 tablets (40 mEq total) by mouth daily. 90 tablet 1   spironolactone (ALDACTONE) 25 MG tablet Take 1 tablet (25 mg total) by mouth daily. 90 tablet 1   torsemide (DEMADEX) 20 MG tablet TAKE 1 TO 2 TABLETS BY MOUTH DAILY. ONLY TAKE 2 TABLETS FOR 1 TO 3 DAYS AND IF PERSISTS, CALL WITH BAD SWELLING 180 tablet 1   vitamin B-12 (CYANOCOBALAMIN) 500 MCG tablet Take 1 tablet (500 mcg total) by mouth daily. 90 tablet 3   No current facility-administered medications for this visit.    Allergies:   Patient has no known allergies.   Social History:  The patient  reports that she has never smoked. She has never used smokeless tobacco. She reports that she does not currently use alcohol. She reports that she does not use drugs.   Family History:   family history includes Breast cancer in her maternal grandmother and mother; Cancer in her brother and brother; Heart  disease in her brother and mother; Heart disease (age of onset: 32) in her father; Osteoporosis in her mother; Parkinson's disease in her brother.    Review of Systems: Review of Systems  Constitutional: Negative.   HENT: Negative.    Respiratory: Negative.    Cardiovascular: Negative.   Gastrointestinal: Negative.   Musculoskeletal: Negative.   Neurological: Negative.   Psychiatric/Behavioral: Negative.    All other systems reviewed and are negative.   PHYSICAL EXAM: VS:  BP 100/70 (BP Location: Left Arm, Patient Position: Sitting, Cuff Size: Normal)    Pulse 88    Ht 5\' 1"  (1.549 m)    Wt 188 lb (85.3 kg)    SpO2 98%    BMI 35.52 kg/m  , BMI Body mass index is 35.52 kg/m. GEN: Well nourished, well developed, in no acute distress HEENT: normal Neck: no JVD, carotid bruits, or masses Cardiac: RRR; no murmurs, rubs, or gallops,no edema  Respiratory:  clear to auscultation bilaterally, normal work of breathing GI: soft, nontender, nondistended, + BS MS: no deformity or atrophy Skin: warm and dry, no rash Neuro:  Strength and sensation are intact Psych: euthymic mood, full affect    Recent Labs: 08/01/2021: TSH 0.902 08/05/2021: B Natriuretic Peptide 184.5 09/11/2021: Magnesium 2.2 11/19/2021: ALT 11; BUN 49; Creatinine, Ser 2.31; Hemoglobin 12.3; Platelets 309; Potassium 3.4; Sodium 138    Lipid Panel Lab Results  Component Value Date   CHOL 194 11/19/2021   HDL 39 (L) 11/19/2021   LDLCALC 133 (H) 11/19/2021   TRIG 121 11/19/2021      Wt Readings from Last 3 Encounters:  11/26/21 188 lb (85.3 kg)  11/19/21 190 lb (86.2 kg)  10/30/21 186 lb (84.4 kg)      ASSESSMENT AND PLAN:  Problem List Items Addressed This Visit       Cardiology Problems   Pericardial effusion   Atrial fibrillation ([place]) - Primary     Other   Type 2 diabetes mellitus ([place])   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 diabetes mellitus ([place])   Lymphedema   Other Visit Diagnoses     Chronic heart  failure with preserved ejection fraction (H[facility])       Essential hypertension       Relevant Orders   EKG 12-Lead      Chronic diastolic CHF Appears euvolemic on torsemide 20 daily with metolazone 2.5 twice a week Has moderated her fluid and salt intake On Jardiance Other medications limited by hypotension, she is on midodrine  Chronic renal failure Managed by nephrology, creatinine range 1.6 up to 2.6  Hypotension Continue midodrine 10 3 times daily for now  Anemia Much improved, hemoglobin 9 now running greater than 12  Permanent atrial fibrillation On anticoagulation, rate controlled on metoprolol  Long review of recent hospitalization  Total encounter time more than 40 minutes  Greater than 50% was spent in counseling and coordination of care with the patient    Signed, Esmond Plants, M.D., Ph.D. [place], [place]

## 2021-11-26 ENCOUNTER — Encounter: Payer: Self-pay | Admitting: Cardiovascular Disease

## 2021-11-26 ENCOUNTER — Other Ambulatory Visit: Payer: Self-pay

## 2021-11-26 ENCOUNTER — Ambulatory Visit: Payer: Medicare PPO | Admitting: Cardiovascular Disease

## 2021-11-26 VITALS — BP 100/70 | HR 88 | Ht 61.0 in | Wt 188.0 lb

## 2021-11-26 DIAGNOSIS — N183 Chronic kidney disease, stage 3 unspecified: Secondary | ICD-10-CM

## 2021-11-26 DIAGNOSIS — I5032 Chronic diastolic (congestive) heart failure: Secondary | ICD-10-CM

## 2021-11-26 DIAGNOSIS — I4821 Permanent atrial fibrillation: Secondary | ICD-10-CM

## 2021-11-26 DIAGNOSIS — I890 Lymphedema, not elsewhere classified: Secondary | ICD-10-CM

## 2021-11-26 DIAGNOSIS — I10 Essential (primary) hypertension: Secondary | ICD-10-CM

## 2021-11-26 DIAGNOSIS — E1122 Type 2 diabetes mellitus with diabetic chronic kidney disease: Secondary | ICD-10-CM

## 2021-11-26 DIAGNOSIS — N184 Chronic kidney disease, stage 4 (severe): Secondary | ICD-10-CM

## 2021-11-26 DIAGNOSIS — I3139 Other pericardial effusion (noninflammatory): Secondary | ICD-10-CM

## 2021-11-26 NOTE — Patient Instructions (Signed)
Medication Instructions:  No changes  If you need a refill on your cardiac medications before your next appointment, please call your pharmacy.   Lab work: No new labs needed  Testing/Procedures: No new testing needed  Follow-Up: At [facility], you and your health needs are our priority.  As part of our continuing mission to provide you with exceptional heart care, we have created designated Provider Care Teams.  These Care Teams include your primary Cardiologist (physician) and Advanced Practice Providers (APPs -  Physician Assistants and Nurse Practitioners) who all work together to provide you with the care you need, when you need it.  You will need a follow up appointment in 6 months, App ok  Providers on your designated Care Team:   Murray Hodgkins, NP Christell Faith, PA-C Cadence Kathlen Mody, [place]  COVID-19 Vaccine Information can be found at: [email or web address] For questions related to vaccine distribution or appointments, please email [email or web address] or call [phone].

## 2021-12-03 DIAGNOSIS — Z961 Presence of intraocular lens: Secondary | ICD-10-CM | POA: Diagnosis not present

## 2021-12-03 NOTE — Progress Notes (Signed)
Patient ID: Karen Farley, female    DOB: August 02, 1946, 76 y.o.   MRN: [id number]  HPI  Karen Farley is a 76 y/o female with a history of DM, hyperlipidemia, HTN, CKD, DDD, GIB, lymphedema, permanent atrial fibrillation, pleural effusion and chronic heart failure.   Echo report from 11/14/21 reviewed and showed an EF of 60-65% along with mild LVH, small/moderate pericardial effusion and moderate MR. Echo report from 08/23/21 reviewed and showed an EF of 60-65% along with mild LVH, severe LAE and moderate MR.   Admitted 07/26/21 due to worsening shortness of breath and pedal edema. Thoracentesis performed with removal of 631ml. Initially given IV lasix with IV albumin. Transitioned to lasix drip with transition to oral diuretics. GI, EP, nephrology and wound consults obtained.  S/p percardiocentesis x1 08/05/21. Needed vasopressors due to hypotension. Paracentesis also performed. Transfused RBC's due to anemia. Discharged after 33 days.  She presents today for a follow-up visit with a chief complaint of moderate fatigue with minimal exertion. She describes this as chronic in nature. She has associated cough, minimal abdominal distention, difficulty sleeping and chronic pain along with this. She denies any dizziness, palpitations, pedal edema, chest pain, shortness of breath or weight gain.   Due to renal function, nephrology has decrease her metolazone to 2 x/week until next week and then will resume this 3 x/ week.   Drinks ~ 40 ounces of fluids daily. Doesn't add salt to her food but does eat out often as neither her nor her husband cooks much.   Past Medical History:  Diagnosis Date   CHF (congestive heart failure) ([place])    Chronic heart failure with preserved ejection fraction (HFpEF) ([place])    a. 11/2019 Echo: EF 60-65%; b. 07/2021 Echo: EF 60-65%.   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 07/2021 req 2u prbcs. Eval deferred given resolution of bleeding and cardiac issues.   Hyperlipidemia    Hypertension    Lymphedema    Morbid obesity ([name])    Osteoarthritis of both knees    Osteopenia    Pericardial effusion    a. 07/2021 Echo: Nl EF. Large pericardial effusion-->s/p pericardiocentesis of 727ml; b. 08/23/2021 Echo: EF 60-65%, mild LVH, Nl RV fxn, sev dil LA, moderate pericardial effusion. L pleural effusion.   Permanent atrial fibrillation (HCC)    Pleural effusion, left    a. 07/2021 s/p thoracentesis - 637ml.   Past Surgical History:  Procedure Laterality Date   CATARACT EXTRACTION W/PHACO Left 10/09/2021   Procedure: CATARACT EXTRACTION PHACO AND INTRAOCULAR LENS PLACEMENT (IOC) LEFT DIABETIC 4.84 00:37.8;  Surgeon: Birder Robson, MD;  Location: [facility];  Service: Ophthalmology;  Laterality: Left;  Diabetic   CATARACT EXTRACTION W/PHACO Right 10/30/2021   Procedure: CATARACT EXTRACTION PHACO AND INTRAOCULAR LENS PLACEMENT (IOC) RIGHT DIABETIC 5.65 00:38.5;  Surgeon: Birder Robson, MD;  Location: [facility];  Service: Ophthalmology;  Laterality: Right;  Diabetic   CHOLECYSTECTOMY     DILATION AND CURETTAGE OF UTERUS     IR FLUORO GUIDE CV LINE RIGHT  08/16/2021   IR THORACENTESIS ASP PLEURAL SPACE W/IMG GUIDE  08/16/2021   PERICARDIOCENTESIS N/A 08/05/2021   Procedure: PERICARDIOCENTESIS;  Surgeon: Isaias Cowman, MD;  Location: [facility];  Service:  Cardiovascular;  Laterality: N/A;   TEAR DUCT PROBING WITH STRABISMUS REPAIR Right    TONSILLECTOMY     Family History  Problem Relation Age of Onset   Heart disease Mother        CHF   Osteoporosis Mother    Breast cancer Mother    Heart disease Father 84   Cancer Brother        Colon CA- 2002- Youngest brother   Parkinson's disease Brother        Younger Brother    Heart disease Brother        2 brothers   Cancer Brother    Breast cancer Maternal Grandmother    Social History   Tobacco Use   Smoking status: Never   Smokeless tobacco: Never  Substance Use Topics   Alcohol use: Not Currently    Alcohol/week: 0.0 standard drinks    Comment: on rare occasion   No Known Allergies  Prior to Admission medications   Medication Sig Start Date End Date Taking? Authorizing Provider  ACCU-CHEK GUIDE test strip USE TO CHECK BLOOD SUGAR DAILY 09/30/21  Yes [provider]  apixaban (ELIQUIS) 5 MG TABS tablet Take 1 tablet (5 mg total) by mouth 2 (two) times daily. 04/18/21  Yes Fritzi Mandes, MD  Cholecalciferol (VITAMIN D3) 125 MCG (5000 UT) TABS Take by mouth.   Yes [provider]  Elastic Bandages & Supports ([place]) [place] by Does not apply route.   Yes Johnson, [name], DO  empagliflozin (JARDIANCE) 25 MG TABS tablet Take 1 tablet (25 mg total) by mouth daily before breakfast. 11/19/21  Yes Johnson, [name], DO  folic acid (FOLVITE) 1 MG tablet Take 1 tablet (1 mg total) by mouth daily. 09/04/21  Yes Johnson, [name], DO  insulin glargine, 2 Unit Dial, (TOUJEO MAX SOLOSTAR) 300 UNIT/ML Solostar Pen Inject 26 Units into the skin at bedtime. 11/13/21  Yes Johnson, [name], DO  Insulin Pen Needle (PEN NEEDLES) 30G X 8 MM MISC 1 each by Does not apply route daily. 09/11/21  Yes Johnson, [name], DO  iron polysaccharides (NIFEREX) 150 MG capsule Take 1 capsule (150 mg total) by mouth daily. 10/26/21  Yes Johnson, [name], DO  latanoprost (XALATAN) 0.005 % ophthalmic solution Place 1 drop into both eyes at bedtime.   Yes [provider]  metolazone (ZAROXOLYN) 2.5 MG tablet Take 1 tablet by mouth every Monday, Wednesday, and Friday. 10/08/21  Yes [provider]  metoprolol succinate (TOPROL-XL) 25 MG 24 hr tablet Take 0.5 tablets (12.5 mg total) by mouth daily. 11/19/21  Yes Johnson, [name], DO  midodrine  (PROAMATINE) 10 MG tablet Take 1 tablet (10 mg total) by mouth 3 (three) times daily with meals. 11/19/21  Yes Johnson, [name], DO  Multiple Vitamin (MULTIVITAMIN WITH MINERALS) TABS tablet Take 1 tablet by mouth daily.   Yes [provider]  pantoprazole (PROTONIX) 40 MG tablet Take 1 tablet (40 mg total) by mouth 2 (two) times daily. 09/04/21  Yes Johnson, [name], DO  polyethylene glycol (MIRALAX / GLYCOLAX) 17 g packet Take 17 g by mouth daily. 08/28/21  Yes Fritzi Mandes, MD  potassium chloride SA (KLOR-CON) 20 MEQ tablet Take 2 tablets (40 mEq total) by mouth daily. 09/11/21  Yes Theora Gianotti, NP  spironolactone (ALDACTONE) 25 MG tablet Take 1 tablet (25 mg total) by mouth daily. 11/19/21  Yes Johnson, [name], DO  torsemide (DEMADEX) 20 MG tablet TAKE  1 TO 2 TABLETS BY MOUTH DAILY. ONLY TAKE 2 TABLETS FOR 1 TO 3 DAYS AND IF PERSISTS, CALL WITH BAD SWELLING 10/25/21  Yes Johnson, [name], DO  vitamin B-12 (CYANOCOBALAMIN) 500 MCG tablet Take 1 tablet (500 mcg total) by mouth daily. 09/04/21  Yes Johnson, [name], DO   Review of Systems  Constitutional:  Positive for appetite change (decreased) and fatigue (easily).  HENT:  Negative for congestion, postnasal drip and sore throat.   Eyes: Negative.   Respiratory:  Positive for cough (dry, hacky). Negative for chest tightness and shortness of breath.   Cardiovascular:  Negative for chest pain, palpitations and leg swelling.  Gastrointestinal:  Positive for abdominal distention ("very little"). Negative for abdominal pain.  Endocrine: Negative.   Genitourinary: Negative.   Musculoskeletal:  Positive for arthralgias (knee pain) and back pain. Negative for neck pain.  Skin: Negative.   Allergic/Immunologic: Negative.   Neurological:  Negative for dizziness and light-headedness.  Hematological:  Negative for adenopathy. Does not bruise/bleed easily.  Psychiatric/Behavioral:  Positive for sleep disturbance (not sleeping well).  Negative for dysphoric mood. The patient is not nervous/anxious.    Vitals:   12/05/21 1321  BP: 133/89  Pulse: 93  Resp: 14  SpO2: 100%  Weight: 184 lb 9 oz (83.7 kg)  Height: 5\' 1"  (1.549 m)   Wt Readings from Last 3 Encounters:  12/05/21 184 lb 9 oz (83.7 kg)  11/26/21 188 lb (85.3 kg)  11/19/21 190 lb (86.2 kg)   Lab Results  Component Value Date   CREATININE 2.31 (H) 11/19/2021   CREATININE 1.64 (H) 09/21/2021   CREATININE 1.93 (H) 09/14/2021   Physical Exam Vitals and nursing note reviewed. Exam conducted with a chaperone present (husband).  Constitutional:      Appearance: Normal appearance.  HENT:     Head: Normocephalic and atraumatic.  Cardiovascular:     Rate and Rhythm: Normal rate. Rhythm irregular.  Pulmonary:     Effort: Pulmonary effort is normal. No respiratory distress.     Breath sounds: No wheezing or rales.  Abdominal:     General: There is no distension.     Palpations: Abdomen is soft.  Musculoskeletal:        General: No tenderness.     Cervical back: Normal range of motion and neck supple.     Right lower leg: Edema (trace pitting) present.     Left lower leg: Edema (trace pitting) present.  Skin:    General: Skin is warm and dry.  Neurological:     General: No focal deficit present.     Mental Status: She is alert and oriented to person, place, and time.  Psychiatric:        Mood and Affect: Mood normal.        Behavior: Behavior normal.   Assessment & Plan:  1: Chronic heart failure with preserved ejection fraction with structural changes (LVH)- - NYHA class III - euvolemic today - weighing daily; reminded to call for an overnight weight gain of > 2 pounds or a weekly weight gain of > 5 pounds - weight down 5 pounds from last visit here 3 months ago - on GDMT of jardiance although on the 25mg  dose - renal function may not allow for entresto; has history of hypotension - not adding salt but admits that they don't cook much and tend  to eat out often; encouraged her to make low sodium choices when possible - drinking ~ 40 ounces of  liquid daily - BNP 08/05/21 was 184.5  2: HTN with CKD- - BP looks good (133/89) - saw PCP Wynetta Emery) 11/19/21 - saw nephrology Candiss Norse) 11/20/21 - BMP 11/19/21 reviewed and showed sodium 138, potassium 3.4, creatinine 2.31 and GFR 22  3: DM- - A1c 11/19/21 was 9.6%  4: Atrial fibrillation- - saw cardiology Rockey Situ) 11/26/21 - on apixaban and metoprolol  5: Lymphedema- - stage 2 - wearing compression socks daily with improvement of edema - using compression boots twice daily with improvement of edema   Medication list reviewed.   Return in 6 months, sooner if needed

## 2021-12-05 ENCOUNTER — Other Ambulatory Visit: Payer: Self-pay

## 2021-12-05 ENCOUNTER — Ambulatory Visit: Payer: Medicare PPO | Admitting: Family

## 2021-12-05 ENCOUNTER — Encounter: Payer: Self-pay | Admitting: Family

## 2021-12-05 ENCOUNTER — Ambulatory Visit: Payer: Medicare PPO | Attending: Family | Admitting: Family

## 2021-12-05 VITALS — BP 133/89 | HR 93 | Resp 14 | Ht 61.0 in | Wt 184.6 lb

## 2021-12-05 DIAGNOSIS — I890 Lymphedema, not elsewhere classified: Secondary | ICD-10-CM

## 2021-12-05 DIAGNOSIS — I10 Essential (primary) hypertension: Secondary | ICD-10-CM

## 2021-12-05 DIAGNOSIS — E1022 Type 1 diabetes mellitus with diabetic chronic kidney disease: Secondary | ICD-10-CM | POA: Diagnosis not present

## 2021-12-05 DIAGNOSIS — I4821 Permanent atrial fibrillation: Secondary | ICD-10-CM | POA: Diagnosis not present

## 2021-12-05 DIAGNOSIS — N183 Chronic kidney disease, stage 3 unspecified: Secondary | ICD-10-CM | POA: Insufficient documentation

## 2021-12-05 DIAGNOSIS — Z7984 Long term (current) use of oral hypoglycemic drugs: Secondary | ICD-10-CM | POA: Insufficient documentation

## 2021-12-05 DIAGNOSIS — E785 Hyperlipidemia, unspecified: Secondary | ICD-10-CM | POA: Diagnosis not present

## 2021-12-05 DIAGNOSIS — R059 Cough, unspecified: Secondary | ICD-10-CM | POA: Insufficient documentation

## 2021-12-05 DIAGNOSIS — M5136 Other intervertebral disc degeneration, lumbar region: Secondary | ICD-10-CM | POA: Insufficient documentation

## 2021-12-05 DIAGNOSIS — D649 Anemia, unspecified: Secondary | ICD-10-CM | POA: Diagnosis not present

## 2021-12-05 DIAGNOSIS — Z7901 Long term (current) use of anticoagulants: Secondary | ICD-10-CM | POA: Insufficient documentation

## 2021-12-05 DIAGNOSIS — I130 Hypertensive heart and chronic kidney disease with heart failure and stage 1 through stage 4 chronic kidney disease, or unspecified chronic kidney disease: Secondary | ICD-10-CM | POA: Insufficient documentation

## 2021-12-05 DIAGNOSIS — Z794 Long term (current) use of insulin: Secondary | ICD-10-CM

## 2021-12-05 DIAGNOSIS — J90 Pleural effusion, not elsewhere classified: Secondary | ICD-10-CM | POA: Diagnosis not present

## 2021-12-05 DIAGNOSIS — I5032 Chronic diastolic (congestive) heart failure: Secondary | ICD-10-CM | POA: Diagnosis not present

## 2021-12-05 DIAGNOSIS — N184 Chronic kidney disease, stage 4 (severe): Secondary | ICD-10-CM | POA: Diagnosis not present

## 2021-12-05 DIAGNOSIS — R5383 Other fatigue: Secondary | ICD-10-CM | POA: Diagnosis not present

## 2021-12-05 DIAGNOSIS — E1122 Type 2 diabetes mellitus with diabetic chronic kidney disease: Secondary | ICD-10-CM | POA: Diagnosis not present

## 2021-12-05 NOTE — Patient Instructions (Signed)
Continue weighing daily and call for an overnight weight gain of 3 pounds or more or a weekly weight gain of more than 5 pounds.  °

## 2021-12-07 ENCOUNTER — Other Ambulatory Visit: Payer: Medicare PPO

## 2021-12-07 ENCOUNTER — Other Ambulatory Visit: Payer: Self-pay

## 2021-12-07 DIAGNOSIS — N179 Acute kidney failure, unspecified: Secondary | ICD-10-CM | POA: Diagnosis not present

## 2021-12-07 LAB — URINALYSIS, ROUTINE W REFLEX MICROSCOPIC
Bilirubin, UA: NEGATIVE
Ketones, UA: NEGATIVE
Leukocytes,UA: NEGATIVE
Nitrite, UA: NEGATIVE
Protein,UA: NEGATIVE
RBC, UA: NEGATIVE
Specific Gravity, UA: 1.015 (ref 1.005–1.030)
Urobilinogen, Ur: 0.2 mg/dL (ref 0.2–1.0)
pH, UA: 6.5 (ref 5.0–7.5)

## 2021-12-08 LAB — PTH, INTACT AND CALCIUM: PTH: 29 pg/mL (ref 15–65)

## 2021-12-08 LAB — RENAL FUNCTION PANEL
Albumin: 4.3 g/dL (ref 3.7–4.7)
BUN/Creatinine Ratio: 17 (ref 12–28)
BUN: 37 mg/dL — ABNORMAL HIGH (ref 8–27)
CO2: 34 mmol/L — ABNORMAL HIGH (ref 20–29)
Calcium: 10.3 mg/dL (ref 8.7–10.3)
Chloride: 86 mmol/L — ABNORMAL LOW (ref 96–106)
Creatinine, Ser: 2.18 mg/dL — ABNORMAL HIGH (ref 0.57–1.00)
Glucose: 358 mg/dL — ABNORMAL HIGH (ref 70–99)
Phosphorus: 4.4 mg/dL — ABNORMAL HIGH (ref 3.0–4.3)
Potassium: 3.7 mmol/L (ref 3.5–5.2)
Sodium: 134 mmol/L (ref 134–144)
eGFR: 23 mL/min/{1.73_m2} — ABNORMAL LOW (ref >59)

## 2021-12-08 LAB — CBC WITH DIFFERENTIAL/PLATELET
Basophils Absolute: 0.1 10*3/uL (ref 0.0–0.2)
Basos: 1 %
EOS (ABSOLUTE): 0.2 10*3/uL (ref 0.0–0.4)
Eos: 3 %
Hematocrit: 42.5 % (ref 34.0–46.6)
Hemoglobin: 12.9 g/dL (ref 11.1–15.9)
Immature Grans (Abs): 0 10*3/uL (ref 0.0–0.1)
Immature Granulocytes: 0 %
Lymphocytes Absolute: 0.6 10*3/uL — ABNORMAL LOW (ref 0.7–3.1)
Lymphs: 9 %
MCH: 22.8 pg — ABNORMAL LOW (ref 26.6–33.0)
MCHC: 30.4 g/dL — ABNORMAL LOW (ref 31.5–35.7)
MCV: 75 fL — ABNORMAL LOW (ref 79–97)
Monocytes Absolute: 0.7 10*3/uL (ref 0.1–0.9)
Monocytes: 11 %
Neutrophils Absolute: 4.9 10*3/uL (ref 1.4–7.0)
Neutrophils: 76 %
Platelets: 381 10*3/uL (ref 150–450)
RBC: 5.65 x10E6/uL — ABNORMAL HIGH (ref 3.77–5.28)
RDW: 17.9 % — ABNORMAL HIGH (ref 11.7–15.4)
WBC: 6.4 10*3/uL (ref 3.4–10.8)

## 2021-12-08 LAB — PROTEIN, TOTAL: Total Protein: 6.5 g/dL (ref 6.0–8.5)

## 2021-12-13 DIAGNOSIS — N184 Chronic kidney disease, stage 4 (severe): Secondary | ICD-10-CM | POA: Diagnosis not present

## 2021-12-13 DIAGNOSIS — E876 Hypokalemia: Secondary | ICD-10-CM | POA: Diagnosis not present

## 2021-12-13 DIAGNOSIS — E1122 Type 2 diabetes mellitus with diabetic chronic kidney disease: Secondary | ICD-10-CM | POA: Diagnosis not present

## 2021-12-13 DIAGNOSIS — R600 Localized edema: Secondary | ICD-10-CM | POA: Diagnosis not present

## 2021-12-17 ENCOUNTER — Encounter: Payer: Self-pay | Admitting: Family Medicine

## 2021-12-17 ENCOUNTER — Ambulatory Visit (INDEPENDENT_AMBULATORY_CARE_PROVIDER_SITE_OTHER): Payer: Medicare PPO | Admitting: Family Medicine

## 2021-12-17 DIAGNOSIS — E1121 Type 2 diabetes mellitus with diabetic nephropathy: Secondary | ICD-10-CM

## 2021-12-17 MED ORDER — TOUJEO MAX SOLOSTAR 300 UNIT/ML ~~LOC~~ SOPN: 30.0000 [IU] | PEN_INJECTOR | Freq: Every day | SUBCUTANEOUS | 4 refills | Status: DC

## 2021-12-17 NOTE — Progress Notes (Signed)
There were no vitals taken for this visit.   Subjective:    Patient ID: Karen Farley, female    DOB: 08-Sep-1946, 76 y.o.   MRN: [id number]  HPI: Karen Farley is a 76 y.o. female  Chief Complaint  Patient presents with   Diabetes    Patient blood sugar this morning was 298 at 9:00 am    DIABETES- tolerating her jardiance well.  Hypoglycemic episodes:no Polydipsia/polyuria: no Visual disturbance: no Chest pain: no Paresthesias: no Glucose Monitoring: yes  Accucheck frequency: TID  Fasting glucose: 298, 251, 231, 239 Taking Insulin?: yes Blood Pressure Monitoring: not checking Retinal Examination: Up to Date Foot Exam: Not up to Date Diabetic Education: Completed Pneumovax: Up to Date Influenza: Up to Date Aspirin: no   Relevant past medical, surgical, family and social history reviewed and updated as indicated. Interim medical history since our last visit reviewed. Allergies and medications reviewed and updated.  Review of Systems  Constitutional: Negative.   Respiratory: Negative.    Cardiovascular: Negative.   Gastrointestinal: Negative.   Musculoskeletal: Negative.   Neurological: Negative.   Psychiatric/Behavioral: Negative.     Per HPI unless specifically indicated above     Objective:    There were no vitals taken for this visit.  Wt Readings from Last 3 Encounters:  12/05/21 184 lb 9 oz (83.7 kg)  11/26/21 188 lb (85.3 kg)  11/19/21 190 lb (86.2 kg)    Physical Exam Vitals and nursing note reviewed.  Pulmonary:     Effort: Pulmonary effort is normal. No respiratory distress.     Comments: Speaking in full sentences Neurological:     Mental Status: She is alert.  Psychiatric:        Mood and Affect: Mood normal.        Behavior: Behavior normal.        Thought Content: Thought content normal.        Judgment: Judgment normal.    Results for orders placed or performed in visit on 12/07/21  Renal Function Panel  Result Value Ref  Range   Glucose 358 (H) 70 - 99 mg/dL   BUN 37 (H) 8 - 27 mg/dL   Creatinine, Ser 3.63 (H) 0.57 - 1.00 mg/dL   eGFR 23 (L) >50 GI/XZQ/3.14   BUN/Creatinine Ratio 17 12 - 28   Sodium 134 134 - 144 mmol/L   Potassium 3.7 3.5 - 5.2 mmol/L   Chloride 86 (L) 96 - 106 mmol/L   CO2 34 (H) 20 - 29 mmol/L   Calcium 10.3 8.7 - 10.3 mg/dL   Phosphorus 4.4 (H) 3.0 - 4.3 mg/dL   Albumin 4.3 3.7 - 4.7 g/dL  CBC w/Diff  Result Value Ref Range   WBC 6.4 3.4 - 10.8 x10E3/uL   RBC 5.65 (H) 3.77 - 5.28 x10E6/uL   Hemoglobin 12.9 11.1 - 15.9 g/dL   Hematocrit 15.6 40.8 - 46.6 %   MCV 75 (L) 79 - 97 fL   MCH 22.8 (L) 26.6 - 33.0 pg   MCHC 30.4 (L) 31.5 - 35.7 g/dL   RDW 16.8 (H) 54.4 - 62.0 %   Platelets 381 150 - 450 x10E3/uL   Neutrophils 76 Not Estab. %   Lymphs 9 Not Estab. %   Monocytes 11 Not Estab. %   Eos 3 Not Estab. %   Basos 1 Not Estab. %   Neutrophils Absolute 4.9 1.4 - 7.0 x10E3/uL   Lymphocytes Absolute 0.6 (L) 0.7 - 3.1 x10E3/uL  Monocytes Absolute 0.7 0.1 - 0.9 x10E3/uL   EOS (ABSOLUTE) 0.2 0.0 - 0.4 x10E3/uL   Basophils Absolute 0.1 0.0 - 0.2 x10E3/uL   Immature Granulocytes 0 Not Estab. %   Immature Grans (Abs) 0.0 0.0 - 0.1 x10E3/uL  PTH, Intact and Calcium  Result Value Ref Range   PTH 29 15 - 65 pg/mL   PTH Interp Comment   Urinalysis, Routine w reflex microscopic  Result Value Ref Range   Specific Gravity, UA 1.015 1.005 - 1.030   pH, UA 6.5 5.0 - 7.5   Color, UA Yellow Yellow   Appearance Ur Clear Clear   Leukocytes,UA Negative Negative   Protein,UA Negative Negative/Trace   Glucose, UA 3+ (A) Negative   Ketones, UA Negative Negative   RBC, UA Negative Negative   Bilirubin, UA Negative Negative   Urobilinogen, Ur 0.2 0.2 - 1.0 mg/dL   Nitrite, UA Negative Negative  Protein, total  Result Value Ref Range   Total Protein 6.5 6.0 - 8.5 g/dL      Assessment & Plan:   Problem List Items Addressed This Visit       Endocrine   Type 2 diabetes mellitus  with renal complication (HCC)    Still running high with sugars in the 230s-290s. Will increase her toujeo to 30 units and recheck in 4 days. May need to adjust further at that time. Call with any concerns.       Relevant Medications   TRADJENTA 5 MG TABS tablet   insulin glargine, 2 Unit Dial, (TOUJEO MAX SOLOSTAR) 300 UNIT/ML Solostar Pen     Follow up plan: Return Friday phone only visit.     This visit was completed via telephone due to the restrictions of the COVID-19 pandemic. All issues as above were discussed and addressed but no physical exam was performed. If it was felt that the patient should be evaluated in the office, they were directed there. The patient verbally consented to this visit. Patient was unable to complete an audio/visual visit due to Lack of equipment. Due to the catastrophic nature of the COVID-19 pandemic, this visit was done through audio contact only. Location of the patient: home Location of the provider: work Those involved with this call:  Provider: Park Liter, DO CMA: Louanna Raw, CMA Front Desk/Registration: [employer]  Time spent on call:  15 minutes on the phone discussing health concerns. 23 minutes total spent in review of patient's record and preparation of their chart.

## 2021-12-17 NOTE — Assessment & Plan Note (Signed)
Still running high with sugars in the 230s-290s. Will increase her toujeo to 30 units and recheck in 4 days. May need to adjust further at that time. Call with any concerns.

## 2021-12-18 ENCOUNTER — Ambulatory Visit: Payer: Self-pay | Admitting: *Deleted

## 2021-12-18 ENCOUNTER — Other Ambulatory Visit: Payer: Self-pay

## 2021-12-18 ENCOUNTER — Emergency Department: Payer: Medicare PPO

## 2021-12-18 ENCOUNTER — Emergency Department
Admission: EM | Admit: 2021-12-18 | Discharge: 2021-12-18 | Disposition: A | Discharge status: Home / Self Care | Payer: Medicare PPO | Attending: Emergency Medicine | Admitting: Emergency Medicine

## 2021-12-18 DIAGNOSIS — Y92003 Bedroom of unspecified non-institutional (private) residence as the place of occurrence of the external cause: Secondary | ICD-10-CM | POA: Insufficient documentation

## 2021-12-18 DIAGNOSIS — W01198A Fall on same level from slipping, tripping and stumbling with subsequent striking against other object, initial encounter: Secondary | ICD-10-CM | POA: Insufficient documentation

## 2021-12-18 DIAGNOSIS — R531 Weakness: Secondary | ICD-10-CM | POA: Diagnosis not present

## 2021-12-18 DIAGNOSIS — I517 Cardiomegaly: Secondary | ICD-10-CM | POA: Diagnosis not present

## 2021-12-18 DIAGNOSIS — R42 Dizziness and giddiness: Secondary | ICD-10-CM | POA: Diagnosis not present

## 2021-12-18 DIAGNOSIS — Z7901 Long term (current) use of anticoagulants: Secondary | ICD-10-CM | POA: Diagnosis not present

## 2021-12-18 DIAGNOSIS — N189 Chronic kidney disease, unspecified: Secondary | ICD-10-CM | POA: Insufficient documentation

## 2021-12-18 DIAGNOSIS — W19XXXA Unspecified fall, initial encounter: Secondary | ICD-10-CM | POA: Diagnosis not present

## 2021-12-18 DIAGNOSIS — S0990XA Unspecified injury of head, initial encounter: Secondary | ICD-10-CM | POA: Diagnosis not present

## 2021-12-18 DIAGNOSIS — M47812 Spondylosis without myelopathy or radiculopathy, cervical region: Secondary | ICD-10-CM | POA: Diagnosis not present

## 2021-12-18 DIAGNOSIS — I10 Essential (primary) hypertension: Secondary | ICD-10-CM | POA: Diagnosis not present

## 2021-12-18 DIAGNOSIS — J9811 Atelectasis: Secondary | ICD-10-CM | POA: Diagnosis not present

## 2021-12-18 DIAGNOSIS — I130 Hypertensive heart and chronic kidney disease with heart failure and stage 1 through stage 4 chronic kidney disease, or unspecified chronic kidney disease: Secondary | ICD-10-CM | POA: Diagnosis not present

## 2021-12-18 DIAGNOSIS — R5381 Other malaise: Secondary | ICD-10-CM | POA: Diagnosis not present

## 2021-12-18 DIAGNOSIS — E1122 Type 2 diabetes mellitus with diabetic chronic kidney disease: Secondary | ICD-10-CM | POA: Insufficient documentation

## 2021-12-18 DIAGNOSIS — Z043 Encounter for examination and observation following other accident: Secondary | ICD-10-CM | POA: Diagnosis not present

## 2021-12-18 DIAGNOSIS — M79672 Pain in left foot: Secondary | ICD-10-CM | POA: Diagnosis not present

## 2021-12-18 DIAGNOSIS — I509 Heart failure, unspecified: Secondary | ICD-10-CM | POA: Insufficient documentation

## 2021-12-18 DIAGNOSIS — J90 Pleural effusion, not elsewhere classified: Secondary | ICD-10-CM | POA: Diagnosis not present

## 2021-12-18 HISTORY — DX: Unspecified atrial fibrillation: I48.91

## 2021-12-18 LAB — CBC WITH DIFFERENTIAL/PLATELET
Abs Immature Granulocytes: 0.04 10*3/uL (ref 0.00–0.07)
Basophils Absolute: 0.1 10*3/uL (ref 0.0–0.1)
Basophils Relative: 1 %
Eosinophils Absolute: 0 10*3/uL (ref 0.0–0.5)
Eosinophils Relative: 0 %
HCT: 45.5 % (ref 36.0–46.0)
Hemoglobin: 13.7 g/dL (ref 12.0–15.0)
Immature Granulocytes: 0 %
Lymphocytes Relative: 5 %
Lymphs Abs: 0.4 10*3/uL — ABNORMAL LOW (ref 0.7–4.0)
MCH: 22.5 pg — ABNORMAL LOW (ref 26.0–34.0)
MCHC: 30.1 g/dL (ref 30.0–36.0)
MCV: 74.7 fL — ABNORMAL LOW (ref 80.0–100.0)
Monocytes Absolute: 0.6 10*3/uL (ref 0.1–1.0)
Monocytes Relative: 6 %
Neutro Abs: 8.4 10*3/uL — ABNORMAL HIGH (ref 1.7–7.7)
Neutrophils Relative %: 88 %
Platelets: 367 10*3/uL (ref 150–400)
RBC: 6.09 MIL/uL — ABNORMAL HIGH (ref 3.87–5.11)
RDW: 20.4 % — ABNORMAL HIGH (ref 11.5–15.5)
WBC: 9.5 10*3/uL (ref 4.0–10.5)
nRBC: 0 % (ref 0.0–0.2)

## 2021-12-18 LAB — COMPREHENSIVE METABOLIC PANEL
ALT: 16 U/L (ref 0–44)
AST: 24 U/L (ref 15–41)
Albumin: 3.7 g/dL (ref 3.5–5.0)
Alkaline Phosphatase: 120 U/L (ref 38–126)
Anion gap: 18 — ABNORMAL HIGH (ref 5–15)
BUN: 40 mg/dL — ABNORMAL HIGH (ref 8–23)
CO2: 33 mmol/L — ABNORMAL HIGH (ref 22–32)
Calcium: 10.4 mg/dL — ABNORMAL HIGH (ref 8.9–10.3)
Chloride: 84 mmol/L — ABNORMAL LOW (ref 98–111)
Creatinine, Ser: 2.39 mg/dL — ABNORMAL HIGH (ref 0.44–1.00)
GFR, Estimated: 21 mL/min — ABNORMAL LOW (ref >60)
Glucose, Bld: 296 mg/dL — ABNORMAL HIGH (ref 70–99)
Potassium: 2.9 mmol/L — ABNORMAL LOW (ref 3.5–5.1)
Sodium: 135 mmol/L (ref 135–145)
Total Bilirubin: 2.2 mg/dL — ABNORMAL HIGH (ref 0.3–1.2)
Total Protein: 7.5 g/dL (ref 6.5–8.1)

## 2021-12-18 LAB — TROPONIN I (HIGH SENSITIVITY)
Troponin I (High Sensitivity): 26 ng/L — ABNORMAL HIGH (ref <18)
Troponin I (High Sensitivity): 24 ng/L — ABNORMAL HIGH (ref <18)

## 2021-12-18 LAB — CBG MONITORING, ED: Glucose-Capillary: 275 mg/dL — ABNORMAL HIGH (ref 70–99)

## 2021-12-18 NOTE — Telephone Encounter (Signed)
°  Chief Complaint: fell hit forehead on dresser, taking eliquis  Symptoms: felling better now. Fell hit head on dresser felt woozy did not pass out . Reported visual changes but clearing up now  Frequency: prior to call Pertinent Negatives: Patient denies dizziness, visual disturbances, severe headache.no bleeding.  Disposition: [x] ED /[] Urgent Care (no appt availability in office) / [] Appointment(In office/virtual)/ []  [facility]/ [] Home Care/ [] Refused Recommended Disposition /[] [facility]/ []  Follow-up with PCP Additional Notes:   Instructed patient to call 911 now or NT will call. Patient reports she can call and husband is with her.      Reason for Disposition  Sounds like a life-threatening emergency to the triager  Answer Assessment - Initial Assessment Questions 1. MECHANISM: "How did the fall happen?"     Fell and hit head on dresser today  2. DOMESTIC VIOLENCE AND ELDER ABUSE SCREENING: "Did you fall because someone pushed you or tried to hurt you?" If Yes, ask: "Are you safe now?"     na 3. ONSET: "When did the fall happen?" (e.g., minutes, hours, or days ago)     Prior to call 4. LOCATION: "What part of the body hit the ground?" (e.g., back, buttocks, head, hips, knees, hands, head, stomach)     Forehead  5. INJURY: "Did you hurt (injure) yourself when you fell?" If Yes, ask: "What did you injure? Tell me more about this?" (e.g., body area; type of injury; pain severity)"     Yes redness noted on forehead 6. PAIN: "Is there any pain?" If Yes, ask: "How bad is the pain?" (e.g., Scale 1-10; or mild,  moderate, severe)   - NONE (0): No pain   - MILD (1-3): Doesn't interfere with normal activities    - MODERATE (4-7): Interferes with normal activities or awakens from sleep    - SEVERE (8-10): Excruciating pain, unable to do any normal activities      na 7. SIZE: For cuts, bruises, or swelling, ask: "How large is it?" (e.g., inches or centimeters)       Red  8. PREGNANCY: "Is there any chance you are pregnant?" "When was your last menstrual period?"     na 9. OTHER SYMPTOMS: "Do you have any other symptoms?" (e.g., dizziness, fever, weakness; new onset or worsening).      Felt woozy and vision is now clearing . Denies losing consciousness 10. CAUSE: "What do you think caused the fall (or falling)?" (e.g., tripped, dizzy spell)       Changed dose of insulin and patient thinks may have caused how she was feeling and fell  Protocols used: [facility]

## 2021-12-18 NOTE — ED Notes (Signed)
Blood work attempted from 2 ED staff members unsuccessfully. Lab called at this time for phlebotomy.

## 2021-12-18 NOTE — ED Notes (Signed)
Sent rainbow to the lab. 

## 2021-12-18 NOTE — ED Provider Notes (Signed)
----------------------------------------- °  6:18 PM on 12/18/2021 -----------------------------------------  I received signout on this patient from Dr. Cinda Quest.  The patient was pending a repeat troponin, and the plan was to discharge if this was not rising.  Repeat troponin has gone down slightly.  I reviewed prior labs as well.  Glucose is slightly elevated.  Anion gap is minimally elevated although clinically the patient does not have signs or symptoms of DKA.  The patient remains asymptomatic and states she feels well and wants to go home.  I counseled her on the results of the work-up.  Return precautions given, and she expresses understanding.   Arta Silence, MD 12/18/21 Tresa Moore

## 2021-12-18 NOTE — ED Notes (Signed)
RN unsuccessful with lab draw. 2nd attempt

## 2021-12-18 NOTE — ED Provider Notes (Signed)
[facility] Provider Note    Event Date/Time   First MD Initiated Contact with Patient 12/18/21 1236     (approximate)   History   Fall (Pt had fall in bedroom. Pt states she felt "woozy" after fall, does not remember if she felt that way before fall. Pt had increase in insulin dose beginning last night. Pt does take eliquis. No LOC. Redness to L forehead with no laceration or bleeding. Pt also endorses L foot pain. Pt A&O upon arrival in NAD. Blood sugar 275. )   HPI  Karen Farley is a 76 y.o. female with history of CHF CKD diabetes hyperlipidemia hypertension lymphedema and morbid obesity who reports she had an episode of unsteadiness and fell and hit her head on apparently the dresser in her bedroom.  She says she did not pass out.  She is quite sure about that.  She does not remember exactly why she fell but she thinks it was as I said the episode of unsteadiness.  Patient does take Eliquis for A-fib.      Physical Exam   Triage Vital Signs: ED Triage Vitals  Enc Vitals Group     BP 12/18/21 1226 109/74     Pulse Rate 12/18/21 1226 85     Resp 12/18/21 1226 16     Temp 12/18/21 1226 97.9 F (36.6 C)     Temp Source 12/18/21 1226 Oral     SpO2 12/18/21 1226 100 %     Weight 12/18/21 1224 180 lb (81.6 kg)     Height 12/18/21 1224 5' 1.5" (1.562 m)     Head Circumference --      Peak Flow --      Pain Score 12/18/21 1223 6     Pain Loc --      Pain Edu? --      Excl. in [place]? --     Most recent vital signs: Vitals:   12/18/21 1443 12/18/21 1448  BP:  (!) 120/58  Pulse: 92 90  Resp:  16  Temp:    SpO2: 100% 100%     General: Awake, alert, no distress.  Head normocephalic atraumatic except for a fairly large abrasion left forehead with a little bit of bruising. Neck is supple and nontender Back no spinal tenderness no rib tenderness CV:  Good peripheral perfusion.  Heart regular rate and rhythm no audible murmurs Resp:  Normal  effort.  Lungs are clear Abd:  No distention.  Abdomen soft bowel sounds are positive nontender Extremities trace edema bilaterally patient complains of pain in the left foot great toe MTP area.  There is no obvious bruising.   ED Results / Procedures / Treatments   Labs (all labs ordered are listed, but only abnormal results are displayed) Labs Reviewed  COMPREHENSIVE METABOLIC PANEL - Abnormal; Notable for the following components:      Result Value   Potassium 2.9 (*)    Chloride 84 (*)    CO2 33 (*)    Glucose, Bld 296 (*)    BUN 40 (*)    Creatinine, Ser 2.39 (*)    Calcium 10.4 (*)    Total Bilirubin 2.2 (*)    GFR, Estimated 21 (*)    Anion gap 18 (*)    All other components within normal limits  CBC WITH DIFFERENTIAL/PLATELET - Abnormal; Notable for the following components:   RBC 6.09 (*)    MCV 74.7 (*)    [facility]  22.5 (*)    RDW 20.4 (*)    Neutro Abs 8.4 (*)    Lymphs Abs 0.4 (*)    All other components within normal limits  CBG MONITORING, ED - Abnormal; Notable for the following components:   Glucose-Capillary 275 (*)    All other components within normal limits  TROPONIN I (HIGH SENSITIVITY) - Abnormal; Notable for the following components:   Troponin I (High Sensitivity) 26 (*)    All other components within normal limits  TROPONIN I (HIGH SENSITIVITY)     EKG  EKG read and interpreted by me shows A-fib at a rate of 91.  Normal axis.  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  I reviewed the film in detail CT of the head and neck are read by radiology and I reviewed the films.  No acute changes are seen. Chest x-ray shows cardiomegaly and a small left-sided pleural effusion. PROCEDURES:  Critical Care performed:   Procedures   MEDICATIONS ORDERED IN ED: Medications - No data to display   IMPRESSION / MDM / [place] / ED COURSE  I reviewed the triage vital signs and the  nursing notes. Patient with episode of wooziness and fall.  Possibly could be near syncope but patient is adamant that she did not pass out.  Initial troponin is somewhat elevated.  We will have to get a second 1.  I am going to have to sign this patient out to the oncoming physician for further disposition and evaluation    The patient is on the cardiac monitor to evaluate for evidence of arrhythmia and/or significant heart rate changes.  None have been seen so far except for her known A-fib which is rate controlled.    FINAL CLINICAL IMPRESSION(S) / ED DIAGNOSES   Final diagnoses:  Fall, initial encounter     Rx / DC Orders   ED Discharge Orders     None        Note:  This document was prepared using Dragon voice recognition software and may include unintentional dictation errors.   Nena Polio, MD 12/18/21 [phone]

## 2021-12-19 ENCOUNTER — Telehealth: Payer: Self-pay | Admitting: *Deleted

## 2021-12-19 NOTE — Telephone Encounter (Signed)
Transition Care Management Follow-up Telephone Call ?Date of discharge and from where: [facility] 12-18-2021 ?How have you been since you were released from the hospital? Feeling sore ?Any questions or concerns? No ? ?Items Reviewed: ?Did the pt receive and understand the discharge instructions provided? Yes  ?Medications obtained and verified? No  ?Other? No  ?Any new allergies since your discharge? No  ?Dietary orders reviewed? No ?Do you have support at home? Yes  ? ?Home Care and Equipment/Supplies: ?Were home health services ordered? not applicable ?If so, what is the name of the agency?   ?Has the agency set up a time to come to the patient's home? not applicable ?Were any new equipment or medical supplies ordered?  No ?What is the name of the medical supply agency?  ?Were you able to get the supplies/equipment? not applicable ?Do you have any questions related to the use of the equipment or supplies? No ? ?Functional Questionnaire: (I = Independent and D = Dependent) ?ADLs: I ? ?Bathing/Dressing- I ? ?Meal Prep- I ? ?Eating- I ? ?Maintaining continence- I ? ?Transferring/Ambulation- I ? ?Managing Meds- I ? ?Follow up appointments reviewed: ? ?PCP Hospital f/u appt confirmed? Yes  Scheduled to see 12-21-2021 on 3:40  ?[facility] f/u appt confirmed? No  ?Are transportation arrangements needed? No  ?If their condition worsens, is the pt aware to call PCP or go to the Emergency Dept.? Yes ?Was the patient provided with contact information for the PCP's office or ED? Yes ?Was to pt encouraged to call back with questions or concerns? Yes  ?

## 2021-12-21 ENCOUNTER — Encounter: Payer: Self-pay | Admitting: Family Medicine

## 2021-12-21 ENCOUNTER — Ambulatory Visit: Payer: Medicare PPO | Admitting: Cardiology

## 2021-12-21 ENCOUNTER — Ambulatory Visit (INDEPENDENT_AMBULATORY_CARE_PROVIDER_SITE_OTHER): Payer: Medicare PPO | Admitting: Family Medicine

## 2021-12-21 DIAGNOSIS — W19XXXA Unspecified fall, initial encounter: Secondary | ICD-10-CM

## 2021-12-21 DIAGNOSIS — E1121 Type 2 diabetes mellitus with diabetic nephropathy: Secondary | ICD-10-CM

## 2021-12-21 NOTE — Progress Notes (Signed)
? ?There were no vitals taken for this visit.  ? ?Subjective:  ? ? Patient ID: Karen Farley, female    DOB: 11-02-1945, 76 y.o.   MRN: [id number] ? ?HPI: ?Karen Farley is a 76 y.o. female ? ?Chief Complaint  ?Patient presents with  ? Fall  ?  Went to ER on 2/28. Patient felt woozy and then the next she new she fell on the floor and hit her head on her dresser.  ?ER R/O heart attach and any broken bone per patient. Patient has felt really tired since the fall and shaky today but other wise feeling fine  ? ?ER FOLLOW UP ?Time since discharge: 3 days ?Hospital/facility: [facility] ?Diagnosis: Fall ?Procedures/tests: CLINICAL DATA:  Fall.  Hit head on dresser.  On Eliquis. ?  ?EXAM: ?CT HEAD WITHOUT CONTRAST ?  ?CT CERVICAL SPINE WITHOUT CONTRAST ?  ?TECHNIQUE: ?Multidetector CT imaging of the head and cervical spine was ?performed following the standard protocol without intravenous ?contrast. Multiplanar CT image reconstructions of the cervical spine ?were also generated. ?  ?RADIATION DOSE REDUCTION: This exam was performed according to the ?departmental dose-optimization program which includes automated ?exposure control, adjustment of the mA and/or kV according to ?patient size and/or use of iterative reconstruction technique. ?  ?COMPARISON:  CT head dated April 11, 2021. ?  ?FINDINGS: ?CT HEAD FINDINGS ?  ?Brain: No evidence of acute infarction, hemorrhage, hydrocephalus, ?extra-axial collection or mass lesion/mass effect. Stable atrophy ?and chronic microvascular ischemic changes. ?  ?Vascular: Calcified atherosclerosis at the skull base. No hyperdense ?vessel. ?  ?Skull: Normal. Negative for fracture or focal lesion. ?  ?Sinuses/Orbits: No acute finding. Minimal secretions within the ?right maxillary sinus. Unchanged small retention cyst in the right ?anterior ethmoid air cells. ?  ?Other: None. ?  ?CT CERVICAL SPINE FINDINGS ?  ?Alignment: Reversal of the normal cervical lordosis. No  traumatic ?malalignment. ?  ?Skull base and vertebrae: No acute fracture. No primary bone lesion ?or focal pathologic process. ?  ?Soft tissues and spinal canal: No prevertebral fluid or swelling. No ?visible canal hematoma. ?  ?Disc levels: Multilevel disc height loss and uncovertebral ?hypertrophy, moderate to severe from C4-C5 through C6-C7. Scattered ?mild facet arthropathy throughout the cervical spine. ?  ?Upper chest: Negative. ?  ?Other: Medial deviation of both cervical internal carotid arteries. ?  ?IMPRESSION: ?1. No acute intracranial abnormality. Stable atrophy and chronic ?microvascular ischemic changes. ?2. No acute cervical spine fracture or traumatic listhesis. 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 ?Consultants: None ?New medications: none ?Discharge instructions: Follow up here  ?Status: better ?Still feeling tired and a little shakey, but generally feeling a lot better.  ? ?DIABETES ?Hypoglycemic episodes:no ?Polydipsia/polyuria: no ?Visual disturbance: no ?Chest pain: no ?Paresthesias: no ?Glucose Monitoring: yes ? Accucheck frequency: Daily ? Fasting glucose: 199, 240 ?Taking Insulin?: yes ? Long acting insulin: 30 units ?Blood Pressure Monitoring: not checking ?Retinal Examination: Up to Date ?Foot Exam: Not up to Date ?Diabetic Education: Not Completed ?Pneumovax: Up to Date ?Influenza: Up to Date ?Aspirin: no ? ?Relevant past medical, surgical, family and social history reviewed and updated as indicated. Interim medical history since our last visit reviewed. ?Allergies and medications reviewed and updated. ? ?Review of Systems  ?Constitutional: Negative.   ?Respiratory: Negative.    ?Cardiovascular: Negative.   ?Gastrointestinal: Negative.   ?Musculoskeletal: Negative.   ?Psychiatric/Behavioral: Negative.    ? ?Per HPI unless specifically indicated above ? ?   ?Objective:  ?  ?There were no vitals  taken for this visit.  ?Wt Readings from Last 3 Encounters:  ?12/18/21 180 lb (81.6  kg)  ?12/05/21 184 lb 9 oz (83.7 kg)  ?11/26/21 188 lb (85.3 kg)  ?  ?Physical Exam ?Vitals and nursing note reviewed.  ?Pulmonary:  ?   Effort: Pulmonary effort is normal. No respiratory distress.  ?   Comments: Speaking in full sentences ?Neurological:  ?   Mental Status: She is alert.  ?Psychiatric:     ?   Mood and Affect: Mood normal.     ?   Behavior: Behavior normal.     ?   Thought Content: Thought content normal.     ?   Judgment: Judgment normal.  ? ? ?Results for orders placed or performed during the hospital encounter of 12/18/21  ?Comprehensive metabolic panel  ?Result Value Ref Range  ? Sodium 135 135 - 145 mmol/L  ? Potassium 2.9 (L) 3.5 - 5.1 mmol/L  ? Chloride 84 (L) 98 - 111 mmol/L  ? CO2 33 (H) 22 - 32 mmol/L  ? Glucose, Bld 296 (H) 70 - 99 mg/dL  ? BUN 40 (H) 8 - 23 mg/dL  ? Creatinine, Ser 2.39 (H) 0.44 - 1.00 mg/dL  ? Calcium 10.4 (H) 8.9 - 10.3 mg/dL  ? Total Protein 7.5 6.5 - 8.1 g/dL  ? Albumin 3.7 3.5 - 5.0 g/dL  ? AST 24 15 - 41 U/L  ? ALT 16 0 - 44 U/L  ? Alkaline Phosphatase 120 38 - 126 U/L  ? Total Bilirubin 2.2 (H) 0.3 - 1.2 mg/dL  ? GFR, Estimated 21 (L) >60 mL/min  ? Anion gap 18 (H) 5 - 15  ?CBC with Differential  ?Result Value Ref Range  ? WBC 9.5 4.0 - 10.5 K/uL  ? RBC 6.09 (H) 3.87 - 5.11 MIL/uL  ? Hemoglobin 13.7 12.0 - 15.0 g/dL  ? HCT 45.5 36.0 - 46.0 %  ? MCV 74.7 (L) 80.0 - 100.0 fL  ? MCH 22.5 (L) 26.0 - 34.0 pg  ? MCHC 30.1 30.0 - 36.0 g/dL  ? RDW 20.4 (H) 11.5 - 15.5 %  ? Platelets 367 150 - 400 K/uL  ? nRBC 0.0 0.0 - 0.2 %  ? Neutrophils Relative % 88 %  ? Neutro Abs 8.4 (H) 1.7 - 7.7 K/uL  ? Lymphocytes Relative 5 %  ? Lymphs Abs 0.4 (L) 0.7 - 4.0 K/uL  ? Monocytes Relative 6 %  ? Monocytes Absolute 0.6 0.1 - 1.0 K/uL  ? Eosinophils Relative 0 %  ? Eosinophils Absolute 0.0 0.0 - 0.5 K/uL  ? Basophils Relative 1 %  ? Basophils Absolute 0.1 0.0 - 0.1 K/uL  ? Immature Granulocytes 0 %  ? Abs Immature Granulocytes 0.04 0.00 - 0.07 K/uL  ?CBG monitoring, ED  ?Result Value  Ref Range  ? Glucose-Capillary 275 (H) 70 - 99 mg/dL  ?Troponin I (High Sensitivity)  ?Result Value Ref Range  ? Troponin I (High Sensitivity) 26 (H) <18 ng/L  ?Troponin I (High Sensitivity)  ?Result Value Ref Range  ? Troponin I (High Sensitivity) 24 (H) <18 ng/L  ? ?   ?Assessment & Plan:  ? ?Problem List Items Addressed This Visit   ? ?  ? Endocrine  ? 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  Still running high. Will increase her insulin to 34 units and recheck in about 4 days by phone. Call with any concerns. Continue to monitor.  ?  ?  ? ?  Other Visit Diagnoses   ? ? Fall, initial encounter      ? Recovering. Mechanical fall. Continue to monitor. Call with any concerns or if wants to start PT.  ? ?  ?  ? ?Follow up plan: ?Return tues 3:40 ok to double book. ? ? ? ?This visit was completed via telephone due to the restrictions of the COVID-19 pandemic. All issues as above were discussed and addressed but no physical exam was performed. If it was felt that the patient should be evaluated in the office, they were directed there. The patient verbally consented to this visit. Patient was unable to complete an audio/visual visit due to Lack of equipment. Due to the catastrophic nature of the COVID-19 pandemic, this visit was done through audio contact only. ?Location of the patient: home ?Location of the provider: work ?Those involved with this call:  ?Provider: Park Liter, DO ?CMA: Frazier Butt, CMA ?Front Desk/Registration: [employer]  ?Time spent on call:  25 minutes on the phone discussing health concerns. 40 minutes total spent in review of patient's record and preparation of their chart. ? ? ? ?

## 2021-12-21 NOTE — Assessment & Plan Note (Signed)
Still running high. Will increase her insulin to 34 units and recheck in about 4 days by phone. Call with any concerns. Continue to monitor.  ?

## 2021-12-24 NOTE — Progress Notes (Signed)
Pt scheduled  

## 2021-12-25 ENCOUNTER — Encounter: Payer: Self-pay | Admitting: Family Medicine

## 2021-12-25 ENCOUNTER — Ambulatory Visit (INDEPENDENT_AMBULATORY_CARE_PROVIDER_SITE_OTHER): Payer: Medicare PPO | Admitting: Family Medicine

## 2021-12-25 DIAGNOSIS — E1121 Type 2 diabetes mellitus with diabetic nephropathy: Secondary | ICD-10-CM | POA: Diagnosis not present

## 2021-12-25 NOTE — Progress Notes (Signed)
? ?Wt 180 lb 9.6 oz (81.9 kg)   BMI 33.57 kg/m?   ? ?Subjective:  ? ? Patient ID: Karen Farley, female    DOB: 1946/06/18, 76 y.o.   MRN: [id number] ? ?HPI: ?Karen Farley is a 76 y.o. female ? ?Chief Complaint  ?Patient presents with  ? Diabetes  ?  Patient states her blood sugar this morning was 189 at 10:00 am. Increased her insulin dose as instructed.   ? ?DIABETES ?Hypoglycemic episodes:no ?Polydipsia/polyuria: no ?Visual disturbance: no ?Chest pain: no ?Paresthesias: no ?Glucose Monitoring: yes ? Accucheck frequency: TID ? Fasting glucose: 189 ?Taking Insulin?: yes ? Long acting insulin: 34 units  ?Blood Pressure Monitoring: not checking ?Retinal Examination: Up to Date ?Foot Exam: Up to Date ?Diabetic Education: Completed ?Pneumovax: Up to Date ?Influenza: Up to Date ?Aspirin: yes ? ?Relevant past medical, surgical, family and social history reviewed and updated as indicated. Interim medical history since our last visit reviewed. ?Allergies and medications reviewed and updated. ? ?Review of Systems  ?Constitutional: Negative.   ?Respiratory: Negative.    ?Cardiovascular: Negative.   ?Gastrointestinal: Negative.   ?Musculoskeletal: Negative.   ?Psychiatric/Behavioral: Negative.    ? ?Per HPI unless specifically indicated above ? ?   ?Objective:  ?  ?Wt 180 lb 9.6 oz (81.9 kg)   BMI 33.57 kg/m?   ?Wt Readings from Last 3 Encounters:  ?12/25/21 180 lb 9.6 oz (81.9 kg)  ?12/18/21 180 lb (81.6 kg)  ?12/05/21 184 lb 9 oz (83.7 kg)  ?  ?Physical Exam ?Vitals and nursing note reviewed.  ?Pulmonary:  ?   Effort: Pulmonary effort is normal. No respiratory distress.  ?   Comments: Speaking in full sentences ?Neurological:  ?   Mental Status: She is alert.  ?Psychiatric:     ?   Mood and Affect: Mood normal.     ?   Behavior: Behavior normal.     ?   Thought Content: Thought content normal.     ?   Judgment: Judgment normal.  ? ? ?Results for orders placed or performed during the hospital encounter of 12/18/21   ?Comprehensive metabolic panel  ?Result Value Ref Range  ? Sodium 135 135 - 145 mmol/L  ? Potassium 2.9 (L) 3.5 - 5.1 mmol/L  ? Chloride 84 (L) 98 - 111 mmol/L  ? CO2 33 (H) 22 - 32 mmol/L  ? Glucose, Bld 296 (H) 70 - 99 mg/dL  ? BUN 40 (H) 8 - 23 mg/dL  ? Creatinine, Ser 2.39 (H) 0.44 - 1.00 mg/dL  ? Calcium 10.4 (H) 8.9 - 10.3 mg/dL  ? Total Protein 7.5 6.5 - 8.1 g/dL  ? Albumin 3.7 3.5 - 5.0 g/dL  ? AST 24 15 - 41 U/L  ? ALT 16 0 - 44 U/L  ? Alkaline Phosphatase 120 38 - 126 U/L  ? Total Bilirubin 2.2 (H) 0.3 - 1.2 mg/dL  ? GFR, Estimated 21 (L) >60 mL/min  ? Anion gap 18 (H) 5 - 15  ?CBC with Differential  ?Result Value Ref Range  ? WBC 9.5 4.0 - 10.5 K/uL  ? RBC 6.09 (H) 3.87 - 5.11 MIL/uL  ? Hemoglobin 13.7 12.0 - 15.0 g/dL  ? HCT 45.5 36.0 - 46.0 %  ? MCV 74.7 (L) 80.0 - 100.0 fL  ? MCH 22.5 (L) 26.0 - 34.0 pg  ? MCHC 30.1 30.0 - 36.0 g/dL  ? RDW 20.4 (H) 11.5 - 15.5 %  ? Platelets 367 150 - 400 K/uL  ?  nRBC 0.0 0.0 - 0.2 %  ? Neutrophils Relative % 88 %  ? Neutro Abs 8.4 (H) 1.7 - 7.7 K/uL  ? Lymphocytes Relative 5 %  ? Lymphs Abs 0.4 (L) 0.7 - 4.0 K/uL  ? Monocytes Relative 6 %  ? Monocytes Absolute 0.6 0.1 - 1.0 K/uL  ? Eosinophils Relative 0 %  ? Eosinophils Absolute 0.0 0.0 - 0.5 K/uL  ? Basophils Relative 1 %  ? Basophils Absolute 0.1 0.0 - 0.1 K/uL  ? Immature Granulocytes 0 %  ? Abs Immature Granulocytes 0.04 0.00 - 0.07 K/uL  ?CBG monitoring, ED  ?Result Value Ref Range  ? Glucose-Capillary 275 (H) 70 - 99 mg/dL  ?Troponin I (High Sensitivity)  ?Result Value Ref Range  ? Troponin I (High Sensitivity) 26 (H) <18 ng/L  ?Troponin I (High Sensitivity)  ?Result Value Ref Range  ? Troponin I (High Sensitivity) 24 (H) <18 ng/L  ? ?   ?Assessment & Plan:  ? ?Problem List Items Addressed This Visit   ? ?  ? Endocrine  ? Type 2 diabetes mellitus with renal complication (HCC)  ?  Doing better, but still high. Will increase to 36 units and recheck in 4 days. Call with any concerns. ?  ?  ?  ? ?Follow up  plan: ?Return Phone Call Friday 3:40 OK to double book. ? ? ? ?This visit was completed via telephone due to the restrictions of the COVID-19 pandemic. All issues as above were discussed and addressed but no physical exam was performed. If it was felt that the patient should be evaluated in the office, they were directed there. The patient verbally consented to this visit. Patient was unable to complete an audio/visual visit due to Lack of equipment. Due to the catastrophic nature of the COVID-19 pandemic, this visit was done through audio contact only. ?Location of the patient: home ?Location of the provider: work ?Those involved with this call:  ?Provider: Park Liter, DO ?CMA: Louanna Raw, [place] ?Front Desk/Registration: [employer]  ?Time spent on call:  10 minutes on the phone discussing health concerns. 25 minutes total spent in review of patient's record and preparation of their chart. ? ? ? ?

## 2021-12-25 NOTE — Assessment & Plan Note (Signed)
Doing better, but still high. Will increase to 36 units and recheck in 4 days. Call with any concerns. ?

## 2021-12-26 NOTE — Progress Notes (Signed)
PT scheduled

## 2021-12-28 ENCOUNTER — Other Ambulatory Visit: Payer: Self-pay | Admitting: Family Medicine

## 2021-12-28 ENCOUNTER — Encounter: Payer: Self-pay | Admitting: Family Medicine

## 2021-12-28 ENCOUNTER — Ambulatory Visit (INDEPENDENT_AMBULATORY_CARE_PROVIDER_SITE_OTHER): Payer: Medicare PPO | Admitting: Family Medicine

## 2021-12-28 DIAGNOSIS — Z794 Long term (current) use of insulin: Secondary | ICD-10-CM | POA: Diagnosis not present

## 2021-12-28 DIAGNOSIS — E1121 Type 2 diabetes mellitus with diabetic nephropathy: Secondary | ICD-10-CM | POA: Diagnosis not present

## 2021-12-28 MED ORDER — TOUJEO MAX SOLOSTAR 300 UNIT/ML ~~LOC~~ SOPN: 40.0000 [IU] | PEN_INJECTOR | Freq: Every day | SUBCUTANEOUS | 4 refills | Status: DC

## 2021-12-28 NOTE — Telephone Encounter (Signed)
Requested medication (s) are due for refill today: yes ? ?Requested medication (s) are on the active medication list: yes ? ?Last refill:  10/26/21 #30 with 1 RF ? ?Future visit scheduled: today. ? ?Notes to clinic:  there is not a protocol to follow for this med, please assess. ? ? ?  ? ?Requested Prescriptions  ?Pending Prescriptions Disp Refills  ? iron polysaccharides (NIFEREX) 150 MG capsule [Pharmacy Med Name: IRON COMPLEX '150MG'$  CAPSULES] 30 capsule 1  ?  Sig: TAKE 1 CAPSULE(150 MG) BY MOUTH DAILY  ?  ? Off-Protocol Failed - 12/28/2021  2:32 PM  ?  ?  Failed - Medication not assigned to a protocol, review manually.  ?  ?  Passed - Valid encounter within last 12 months  ?  Recent Outpatient Visits   ? ?      ? 3 days ago Type 2 diabetes mellitus with diabetic nephropathy, unspecified whether long term insulin use ([place])  ? [place], [place] P, DO  ? 1 week ago Type 2 diabetes mellitus with diabetic nephropathy, unspecified whether long term insulin use ([place])  ? [place], [place] P, DO  ? 1 week ago Type 2 diabetes mellitus with diabetic nephropathy, unspecified whether long term insulin use ([place])  ? [place], [name], DO  ? 1 month ago Type 2 diabetes mellitus with diabetic nephropathy, unspecified whether long term insulin use ([place])  ? [place], [place] P, DO  ? 3 months ago Type 2 diabetes mellitus with diabetic nephropathy, unspecified whether long term insulin use ([place])  ? [place], [place] P, DO  ? ?  ?  ?Future Appointments   ? ?        ? In 1 month Gollan, Kathlene November, MD [facility]  ? ?  ? ?  ?  ?  ? ? ?

## 2021-12-28 NOTE — Assessment & Plan Note (Signed)
Still running high. Will increase her toujeo to 40 units at bedtime and recheck 10 days. Call with any concerns. If AM BS goes below 100, will go back down to 38 units.  ?

## 2021-12-28 NOTE — Progress Notes (Signed)
? ?BP (!) 94/50   ? ?Subjective:  ? ? Patient ID: Karen Farley, female    DOB: 23-Dec-1945, 76 y.o.   MRN: [id number] ? ?HPI: ?Karen Farley is a 76 y.o. female ? ?Chief Complaint  ?Patient presents with  ? Diabetes  ?  Patient blood sugar was 189 this morning.   ? ?DIABETES ?Hypoglycemic episodes:no ?Polydipsia/polyuria: yes ?Visual disturbance: no ?Chest pain: no ?Paresthesias: no ?Glucose Monitoring: yes ? Accucheck frequency: Daily ? Fasting glucose: 189 ?Taking Insulin?: yes ? Long acting insulin: 36 units daily ?Blood Pressure Monitoring: not checking ?Retinal Examination: Up to Date ?Foot Exam: Not up to Date ?Diabetic Education: Completed ?Pneumovax: Up to Date ?Influenza: Up to Date ?Aspirin: no ? ?Relevant past medical, surgical, family and social history reviewed and updated as indicated. Interim medical history since our last visit reviewed. ?Allergies and medications reviewed and updated. ? ?Review of Systems  ?Constitutional: Negative.   ?Respiratory: Negative.    ?Cardiovascular: Negative.   ?Gastrointestinal: Negative.   ?Musculoskeletal: Negative.   ?Neurological: Negative.   ?Psychiatric/Behavioral: Negative.    ? ?Per HPI unless specifically indicated above ? ?   ?Objective:  ?  ?BP (!) 94/50   ?Wt Readings from Last 3 Encounters:  ?12/25/21 180 lb 9.6 oz (81.9 kg)  ?12/18/21 180 lb (81.6 kg)  ?12/05/21 184 lb 9 oz (83.7 kg)  ?  ?Physical Exam ?Vitals and nursing note reviewed.  ?Pulmonary:  ?   Effort: Pulmonary effort is normal. No respiratory distress.  ?   Comments: Speaking in full sentences ?Neurological:  ?   Mental Status: She is alert.  ?Psychiatric:     ?   Mood and Affect: Mood normal.     ?   Behavior: Behavior normal.     ?   Thought Content: Thought content normal.     ?   Judgment: Judgment normal.  ? ? ?Results for orders placed or performed during the hospital encounter of 12/18/21  ?Comprehensive metabolic panel  ?Result Value Ref Range  ? Sodium 135 135 - 145 mmol/L  ?  Potassium 2.9 (L) 3.5 - 5.1 mmol/L  ? Chloride 84 (L) 98 - 111 mmol/L  ? CO2 33 (H) 22 - 32 mmol/L  ? Glucose, Bld 296 (H) 70 - 99 mg/dL  ? BUN 40 (H) 8 - 23 mg/dL  ? Creatinine, Ser 2.39 (H) 0.44 - 1.00 mg/dL  ? Calcium 10.4 (H) 8.9 - 10.3 mg/dL  ? Total Protein 7.5 6.5 - 8.1 g/dL  ? Albumin 3.7 3.5 - 5.0 g/dL  ? AST 24 15 - 41 U/L  ? ALT 16 0 - 44 U/L  ? Alkaline Phosphatase 120 38 - 126 U/L  ? Total Bilirubin 2.2 (H) 0.3 - 1.2 mg/dL  ? GFR, Estimated 21 (L) >60 mL/min  ? Anion gap 18 (H) 5 - 15  ?CBC with Differential  ?Result Value Ref Range  ? WBC 9.5 4.0 - 10.5 K/uL  ? RBC 6.09 (H) 3.87 - 5.11 MIL/uL  ? Hemoglobin 13.7 12.0 - 15.0 g/dL  ? HCT 45.5 36.0 - 46.0 %  ? MCV 74.7 (L) 80.0 - 100.0 fL  ? MCH 22.5 (L) 26.0 - 34.0 pg  ? MCHC 30.1 30.0 - 36.0 g/dL  ? RDW 20.4 (H) 11.5 - 15.5 %  ? Platelets 367 150 - 400 K/uL  ? nRBC 0.0 0.0 - 0.2 %  ? Neutrophils Relative % 88 %  ? Neutro Abs 8.4 (H) 1.7 - 7.7  K/uL  ? Lymphocytes Relative 5 %  ? Lymphs Abs 0.4 (L) 0.7 - 4.0 K/uL  ? Monocytes Relative 6 %  ? Monocytes Absolute 0.6 0.1 - 1.0 K/uL  ? Eosinophils Relative 0 %  ? Eosinophils Absolute 0.0 0.0 - 0.5 K/uL  ? Basophils Relative 1 %  ? Basophils Absolute 0.1 0.0 - 0.1 K/uL  ? Immature Granulocytes 0 %  ? Abs Immature Granulocytes 0.04 0.00 - 0.07 K/uL  ?CBG monitoring, ED  ?Result Value Ref Range  ? Glucose-Capillary 275 (H) 70 - 99 mg/dL  ?Troponin I (High Sensitivity)  ?Result Value Ref Range  ? Troponin I (High Sensitivity) 26 (H) <18 ng/L  ?Troponin I (High Sensitivity)  ?Result Value Ref Range  ? Troponin I (High Sensitivity) 24 (H) <18 ng/L  ? ?   ?Assessment & Plan:  ? ?Problem List Items Addressed This Visit   ? ?  ? Endocrine  ? Type 2 diabetes mellitus with renal complication (HCC)  ?  Still running high. Will increase her toujeo to 40 units at bedtime and recheck 10 days. Call with any concerns. If AM BS goes below 100, will go back down to 38 units.  ?  ?  ? Relevant Medications  ? insulin glargine, 2  Unit Dial, (TOUJEO MAX SOLOSTAR) 300 UNIT/ML Solostar Pen  ?  ? ?Follow up plan: ?Return Monday 3/20 3:20 PM OK to double book (telephone). ? ? ? ?This visit was completed via telephone due to the restrictions of the COVID-19 pandemic. All issues as above were discussed and addressed but no physical exam was performed. If it was felt that the patient should be evaluated in the office, they were directed there. The patient verbally consented to this visit. Patient was unable to complete an audio/visual visit due to Lack of equipment. Due to the catastrophic nature of the COVID-19 pandemic, this visit was done through audio contact only. ?Location of the patient: home ?Location of the provider: work ?Those involved with this call:  ?Provider: Park Liter, DO ?CMA: Louanna Raw, [place] ?Front Desk/Registration: [employer]  ?Time spent on call:  10 minutes on the phone discussing health concerns. 15 minutes total spent in review of patient's record and preparation of their chart. ? ? ? ?

## 2021-12-31 NOTE — Progress Notes (Signed)
Pt scheduled  

## 2022-01-07 ENCOUNTER — Ambulatory Visit (INDEPENDENT_AMBULATORY_CARE_PROVIDER_SITE_OTHER): Payer: Medicare PPO | Admitting: Family Medicine

## 2022-01-07 ENCOUNTER — Encounter: Payer: Self-pay | Admitting: Family Medicine

## 2022-01-07 DIAGNOSIS — Z794 Long term (current) use of insulin: Secondary | ICD-10-CM | POA: Diagnosis not present

## 2022-01-07 DIAGNOSIS — E1121 Type 2 diabetes mellitus with diabetic nephropathy: Secondary | ICD-10-CM

## 2022-01-07 MED ORDER — TOUJEO MAX SOLOSTAR 300 UNIT/ML ~~LOC~~ SOPN: 44.0000 [IU] | PEN_INJECTOR | Freq: Every day | SUBCUTANEOUS | 3 refills | Status: DC

## 2022-01-07 NOTE — Assessment & Plan Note (Signed)
Still running high. Will increase insulin to 44units and recheck in about 4 days. Call with any concerns.  ?

## 2022-01-07 NOTE — Progress Notes (Signed)
Pt scheduled  

## 2022-01-07 NOTE — Progress Notes (Signed)
? ?There were no vitals taken for this visit.  ? ?Subjective:  ? ? Patient ID: Karen Farley, female    DOB: 04-05-1946, 76 y.o.   MRN: [id number] ? ?HPI: ?Karen Farley is a 76 y.o. female ? ?Chief Complaint  ?Patient presents with  ? Diabetes  ? ?DIABETES ?Hypoglycemic episodes:no ?Polydipsia/polyuria: yes ?Visual disturbance: no ?Chest pain: no ?Paresthesias: no ?Glucose Monitoring: yes ? Accucheck frequency: Daily ? Fasting glucose: 165-220s ?Taking Insulin?: yes ?Blood Pressure Monitoring: not checking ?Retinal Examination: Up to Date ?Foot Exam: Up to Date ?Diabetic Education: Completed ?Pneumovax: Up to Date ?Influenza: Up to Date ?Aspirin: no ? ?Relevant past medical, surgical, family and social history reviewed and updated as indicated. Interim medical history since our last visit reviewed. ?Allergies and medications reviewed and updated. ? ?Review of Systems  ?Constitutional: Negative.   ?Respiratory: Negative.    ?Cardiovascular: Negative.   ?Musculoskeletal: Negative.   ?Psychiatric/Behavioral: Negative.    ? ?Per HPI unless specifically indicated above ? ?   ?Objective:  ?  ?There were no vitals taken for this visit.  ?Wt Readings from Last 3 Encounters:  ?12/25/21 180 lb 9.6 oz (81.9 kg)  ?12/18/21 180 lb (81.6 kg)  ?12/05/21 184 lb 9 oz (83.7 kg)  ?  ?Physical Exam ?Vitals and nursing note reviewed.  ?Pulmonary:  ?   Effort: Pulmonary effort is normal. No respiratory distress.  ?   Comments: Speaking in full sentences ?Neurological:  ?   Mental Status: She is alert.  ?Psychiatric:     ?   Mood and Affect: Mood normal.     ?   Behavior: Behavior normal.     ?   Thought Content: Thought content normal.     ?   Judgment: Judgment normal.  ? ? ?Results for orders placed or performed during the hospital encounter of 12/18/21  ?Comprehensive metabolic panel  ?Result Value Ref Range  ? Sodium 135 135 - 145 mmol/L  ? Potassium 2.9 (L) 3.5 - 5.1 mmol/L  ? Chloride 84 (L) 98 - 111 mmol/L  ? CO2 33 (H)  22 - 32 mmol/L  ? Glucose, Bld 296 (H) 70 - 99 mg/dL  ? BUN 40 (H) 8 - 23 mg/dL  ? Creatinine, Ser 2.39 (H) 0.44 - 1.00 mg/dL  ? Calcium 10.4 (H) 8.9 - 10.3 mg/dL  ? Total Protein 7.5 6.5 - 8.1 g/dL  ? Albumin 3.7 3.5 - 5.0 g/dL  ? AST 24 15 - 41 U/L  ? ALT 16 0 - 44 U/L  ? Alkaline Phosphatase 120 38 - 126 U/L  ? Total Bilirubin 2.2 (H) 0.3 - 1.2 mg/dL  ? GFR, Estimated 21 (L) >60 mL/min  ? Anion gap 18 (H) 5 - 15  ?CBC with Differential  ?Result Value Ref Range  ? WBC 9.5 4.0 - 10.5 K/uL  ? RBC 6.09 (H) 3.87 - 5.11 MIL/uL  ? Hemoglobin 13.7 12.0 - 15.0 g/dL  ? HCT 45.5 36.0 - 46.0 %  ? MCV 74.7 (L) 80.0 - 100.0 fL  ? MCH 22.5 (L) 26.0 - 34.0 pg  ? MCHC 30.1 30.0 - 36.0 g/dL  ? RDW 20.4 (H) 11.5 - 15.5 %  ? Platelets 367 150 - 400 K/uL  ? nRBC 0.0 0.0 - 0.2 %  ? Neutrophils Relative % 88 %  ? Neutro Abs 8.4 (H) 1.7 - 7.7 K/uL  ? Lymphocytes Relative 5 %  ? Lymphs Abs 0.4 (L) 0.7 - 4.0 K/uL  ?  Monocytes Relative 6 %  ? Monocytes Absolute 0.6 0.1 - 1.0 K/uL  ? Eosinophils Relative 0 %  ? Eosinophils Absolute 0.0 0.0 - 0.5 K/uL  ? Basophils Relative 1 %  ? Basophils Absolute 0.1 0.0 - 0.1 K/uL  ? Immature Granulocytes 0 %  ? Abs Immature Granulocytes 0.04 0.00 - 0.07 K/uL  ?CBG monitoring, ED  ?Result Value Ref Range  ? Glucose-Capillary 275 (H) 70 - 99 mg/dL  ?Troponin I (High Sensitivity)  ?Result Value Ref Range  ? Troponin I (High Sensitivity) 26 (H) <18 ng/L  ?Troponin I (High Sensitivity)  ?Result Value Ref Range  ? Troponin I (High Sensitivity) 24 (H) <18 ng/L  ? ?   ?Assessment & Plan:  ? ?Problem List Items Addressed This Visit   ? ?  ? Endocrine  ? Type 2 diabetes mellitus with renal complication (HCC)  ?  Still running high. Will increase insulin to 44units and recheck in about 4 days. Call with any concerns.  ?  ?  ? Relevant Medications  ? insulin glargine, 2 Unit Dial, (TOUJEO MAX SOLOSTAR) 300 UNIT/ML Solostar Pen  ?  ? ?Follow up plan: ?Return in about 4 days (around 01/11/2022). ? ? ? ? ?This visit was  completed via telephone due to the restrictions of the COVID-19 pandemic. All issues as above were discussed and addressed but no physical exam was performed. If it was felt that the patient should be evaluated in the office, they were directed there. The patient verbally consented to this visit. Patient was unable to complete an audio/visual visit due to Lack of equipment. Due to the catastrophic nature of the COVID-19 pandemic, this visit was done through audio contact only. ?Location of the patient: home ?Location of the provider: work ?Those involved with this call:  ?Provider: Park Liter, DO ?CMA: Irena Reichmann, [place] ?Front Desk/Registration: [employer]  ?Time spent on call:  15 minutes on the phone discussing health concerns. 23 minutes total spent in review of patient's record and preparation of their chart. ? ? ?

## 2022-01-08 ENCOUNTER — Ambulatory Visit: Payer: Self-pay | Admitting: *Deleted

## 2022-01-08 NOTE — Telephone Encounter (Signed)
?  Chief Complaint: dizziness/lightheaded this am . Requesting advise regarding insulin ?Symptoms: lightheaded this am and did not get up until now. Blood sugar checked at 11:39 am for 192. Reports only change is increasing dose of insulin and now feels dizziness when standing. Reports eating and drinking but has not ate or drank this am so far.  patient able walk with walker without severe dizziness ?Frequency: this am  ?Pertinent Negatives: Patient denies chest pain, difficulty breathing, no fever, no weakness on either side of body no visual disturbances. No N/V D. No bleeding.  ?Disposition: '[]'$ ED /'[]'$ Urgent Care (no appt availability in office) / '[]'$ Appointment(In office/virtual)/ '[]'$  [facility]/ '[]'$ Home Care/ '[]'$ Refused Recommended Disposition /'[]'$ [facility]/ '[x]'$  Follow-up with PCP ?Additional Notes:  ? ?Recommended to schedule appt with PCP. Patient would like to eat and drink this am and would like to know if PCP wants to decrease insulin. Please advise if appt needed today . Patient would like a call back with PCP response regarding insulin. Reports this is only change she has made and now she is feeling dizziness.  ? ? Reason for Disposition ? [1] MODERATE dizziness (e.g., interferes with normal activities) AND [2] has NOT been evaluated by physician for this  (Exception: dizziness caused by heat exposure, sudden standing, or poor fluid intake) ? ?Answer Assessment - Initial Assessment Questions ?1. DESCRIPTION: "Describe your dizziness." ?    Lightheaded  ?2. LIGHTHEADED: "Do you feel lightheaded?" (e.g., somewhat faint, woozy, weak upon standing) ?    Weak upon standing but steady walking with walker  ?3. VERTIGO: "Do you feel like either you or the room is spinning or tilting?" (i.e. vertigo) ?    No  ?4. SEVERITY: "How bad is it?"  "Do you feel like you are going to faint?" "Can you stand and walk?" ?  - MILD: Feels slightly dizzy, but walking normally. ?  - MODERATE: Feels unsteady  when walking, but not falling; interferes with normal activities (e.g., school, work). ?  - SEVERE: Unable to walk without falling, or requires assistance to walk without falling; feels like passing out now.  ?    Moderate  ?5. ONSET:  "When did the dizziness begin?" ?    This am  ?6. AGGRAVATING FACTORS: "Does anything make it worse?" (e.g., standing, change in head position) ?     ?7. HEART RATE: "Can you tell me your heart rate?" "How many beats in 15 seconds?"  (Note: not all patients can do this)   ?    Na  ?8. CAUSE: "What do you think is causing the dizziness?" ?    Not sure has not been able to get up today and check blood glucose  ?9. RECURRENT SYMPTOM: "Have you had dizziness before?" If Yes, ask: "When was the last time?" "What happened that time?" ?    Yes , throughout the years.  ?10. OTHER SYMPTOMS: "Do you have any other symptoms?" (e.g., fever, chest pain, vomiting, diarrhea, bleeding) ?      Denies  ?11. PREGNANCY: "Is there any chance you are pregnant?" "When was your last menstrual period?" ?      na ? ?Protocols used: Dizziness - Lightheadedness-A-AH ? ?

## 2022-01-08 NOTE — Telephone Encounter (Signed)
Pt scheduled this Friday at 1 pm ?

## 2022-01-08 NOTE — Telephone Encounter (Signed)
In person appt please ?

## 2022-01-11 ENCOUNTER — Ambulatory Visit: Payer: Medicare PPO | Admitting: Family Medicine

## 2022-01-11 ENCOUNTER — Encounter: Payer: Self-pay | Admitting: Family Medicine

## 2022-01-11 ENCOUNTER — Other Ambulatory Visit: Payer: Self-pay

## 2022-01-11 VITALS — BP 104/70 | HR 94 | Temp 98.2°F | Wt 172.0 lb

## 2022-01-11 DIAGNOSIS — I952 Hypotension due to drugs: Secondary | ICD-10-CM

## 2022-01-11 DIAGNOSIS — R42 Dizziness and giddiness: Secondary | ICD-10-CM

## 2022-01-11 DIAGNOSIS — E1121 Type 2 diabetes mellitus with diabetic nephropathy: Secondary | ICD-10-CM | POA: Diagnosis not present

## 2022-01-11 DIAGNOSIS — Z794 Long term (current) use of insulin: Secondary | ICD-10-CM | POA: Diagnosis not present

## 2022-01-11 NOTE — Patient Instructions (Signed)
STOP TRADJENTA, Continue Jardiance ?

## 2022-01-11 NOTE — Assessment & Plan Note (Signed)
Has been taking both tradjenta and jardiance. Will stop tradjenta. Will hold on adjusting insulin today due to dizziness. Continue to monitor. Recheck 2 weeks.  ?

## 2022-01-11 NOTE — Progress Notes (Signed)
? ?BP 104/70   Pulse 94   Temp 98.2 ?F (36.8 ?C)   Wt 172 lb (78 kg)   SpO2 96%   BMI 31.97 kg/m?   ? ?Subjective:  ? ? Patient ID: Karen Farley, female    DOB: 1946/10/04, 76 y.o.   MRN: [id number] ? ?HPI: ?Karen Farley is a 76 y.o. female ? ?Chief Complaint  ?Patient presents with  ? Diabetes  ?  Patient states she has still been taking tradjenta, per last note in January patient was to stop medication.  ? Hypotension  ?  Patient states Tuesday, and Wednesday morning her BP was low. Thursday she got a normal reading but this morning her BP was low again.   ? ?DIZZINESS ?Duration: 3-4 days ?Description of symptoms:  "off" out of it, weak, fatigued ?Duration of episode: most of the day- better as the day went on ?Dizziness frequency: recurrent ?Triggered by rolling over in bed: no ?Triggered by bending over: no ?Aggravated by head movement: no ?Aggravated by exertion, coughing, loud noises: no ?Recent head injury: no ?Recent or current viral symptoms: no ?History of vasovagal episodes: no ?Nausea: no ?Vomiting: no ?Tinnitus: no ?Hearing loss: no ?Aural fullness: no ?Headache: no ?Photophobia/phonophobia: no ?Unsteady gait: yes ?Postural instability: yes ?Diplopia, dysarthria, dysphagia or weakness: no ?Related to exertion: no ?Pallor: no ?Diaphoresis: no ?Dyspnea: no ?Chest pain: no ? ?DIABETES ?Hypoglycemic episodes:no ?Polydipsia/polyuria: no ?Visual disturbance: no ?Chest pain: no ?Paresthesias: no ?Glucose Monitoring: yes ? Accucheck frequency: BID, 182 this AM ?Taking Insulin?: yes ?Blood Pressure Monitoring: a few times a day ?Retinal Examination: Not up to Date ?Foot Exam: Up to Date ?Diabetic Education: Completed ?Pneumovax: Up to Date ?Influenza: Up to Date ?Aspirin: no ? ?Relevant past medical, surgical, family and social history reviewed and updated as indicated. Interim medical history since our last visit reviewed. ?Allergies and medications reviewed and updated. ? ?Review of Systems   ?Constitutional:  Positive for fatigue. Negative for activity change, appetite change, chills, diaphoresis, fever and unexpected weight change.  ?Respiratory: Negative.    ?Cardiovascular:  Positive for leg swelling. Negative for chest pain and palpitations.  ?Gastrointestinal: Negative.   ?Musculoskeletal: Negative.   ?Neurological:  Positive for dizziness, weakness and light-headedness. Negative for tremors, seizures, syncope, facial asymmetry, speech difficulty, numbness and headaches.  ?Psychiatric/Behavioral: Negative.    ? ?Per HPI unless specifically indicated above ? ?   ?Objective:  ?  ?BP 104/70   Pulse 94   Temp 98.2 ?F (36.8 ?C)   Wt 172 lb (78 kg)   SpO2 96%   BMI 31.97 kg/m?   ?Wt Readings from Last 3 Encounters:  ?01/11/22 172 lb (78 kg)  ?12/25/21 180 lb 9.6 oz (81.9 kg)  ?12/18/21 180 lb (81.6 kg)  ?  ?Physical Exam ?Vitals and nursing note reviewed.  ?Constitutional:   ?   General: She is not in acute distress. ?   Appearance: Normal appearance. She is obese. She is not ill-appearing, toxic-appearing or diaphoretic.  ?HENT:  ?   Head: Normocephalic and atraumatic.  ?   Right Ear: External ear normal.  ?   Left Ear: External ear normal.  ?   Nose: Nose normal.  ?   Mouth/Throat:  ?   Mouth: Mucous membranes are moist.  ?   Pharynx: Oropharynx is clear.  ?Eyes:  ?   General: No scleral icterus.    ?   Right eye: No discharge.     ?   Left eye:  No discharge.  ?   Extraocular Movements: Extraocular movements intact.  ?   Conjunctiva/sclera: Conjunctivae normal.  ?   Pupils: Pupils are equal, round, and reactive to light.  ?Cardiovascular:  ?   Rate and Rhythm: Normal rate and regular rhythm.  ?   Pulses: Normal pulses.  ?   Heart sounds: Normal heart sounds. No murmur heard. ?  No friction rub. No gallop.  ?Pulmonary:  ?   Effort: Pulmonary effort is normal. No respiratory distress.  ?   Breath sounds: Normal breath sounds. No stridor. No wheezing, rhonchi or rales.  ?Chest:  ?   Chest wall: No  tenderness.  ?Musculoskeletal:     ?   General: Normal range of motion.  ?   Cervical back: Normal range of motion and neck supple.  ?Skin: ?   General: Skin is warm and dry.  ?   Capillary Refill: Capillary refill takes less than 2 seconds.  ?   Coloration: Skin is not jaundiced or pale.  ?   Findings: No bruising, erythema, lesion or rash.  ?Neurological:  ?   General: No focal deficit present.  ?   Mental Status: She is alert and oriented to person, place, and time. Mental status is at baseline.  ?Psychiatric:     ?   Mood and Affect: Mood normal.     ?   Behavior: Behavior normal.     ?   Thought Content: Thought content normal.     ?   Judgment: Judgment normal.  ? ? ?Results for orders placed or performed during the hospital encounter of 12/18/21  ?Comprehensive metabolic panel  ?Result Value Ref Range  ? Sodium 135 135 - 145 mmol/L  ? Potassium 2.9 (L) 3.5 - 5.1 mmol/L  ? Chloride 84 (L) 98 - 111 mmol/L  ? CO2 33 (H) 22 - 32 mmol/L  ? Glucose, Bld 296 (H) 70 - 99 mg/dL  ? BUN 40 (H) 8 - 23 mg/dL  ? Creatinine, Ser 2.39 (H) 0.44 - 1.00 mg/dL  ? Calcium 10.4 (H) 8.9 - 10.3 mg/dL  ? Total Protein 7.5 6.5 - 8.1 g/dL  ? Albumin 3.7 3.5 - 5.0 g/dL  ? AST 24 15 - 41 U/L  ? ALT 16 0 - 44 U/L  ? Alkaline Phosphatase 120 38 - 126 U/L  ? Total Bilirubin 2.2 (H) 0.3 - 1.2 mg/dL  ? GFR, Estimated 21 (L) >60 mL/min  ? Anion gap 18 (H) 5 - 15  ?CBC with Differential  ?Result Value Ref Range  ? WBC 9.5 4.0 - 10.5 K/uL  ? RBC 6.09 (H) 3.87 - 5.11 MIL/uL  ? Hemoglobin 13.7 12.0 - 15.0 g/dL  ? HCT 45.5 36.0 - 46.0 %  ? MCV 74.7 (L) 80.0 - 100.0 fL  ? MCH 22.5 (L) 26.0 - 34.0 pg  ? MCHC 30.1 30.0 - 36.0 g/dL  ? RDW 20.4 (H) 11.5 - 15.5 %  ? Platelets 367 150 - 400 K/uL  ? nRBC 0.0 0.0 - 0.2 %  ? Neutrophils Relative % 88 %  ? Neutro Abs 8.4 (H) 1.7 - 7.7 K/uL  ? Lymphocytes Relative 5 %  ? Lymphs Abs 0.4 (L) 0.7 - 4.0 K/uL  ? Monocytes Relative 6 %  ? Monocytes Absolute 0.6 0.1 - 1.0 K/uL  ? Eosinophils Relative 0 %  ?  Eosinophils Absolute 0.0 0.0 - 0.5 K/uL  ? Basophils Relative 1 %  ? Basophils Absolute 0.1 0.0 - 0.1  K/uL  ? Immature Granulocytes 0 %  ? Abs Immature Granulocytes 0.04 0.00 - 0.07 K/uL  ?CBG monitoring, ED  ?Result Value Ref Range  ? Glucose-Capillary 275 (H) 70 - 99 mg/dL  ?Troponin I (High Sensitivity)  ?Result Value Ref Range  ? Troponin I (High Sensitivity) 26 (H) <18 ng/L  ?Troponin I (High Sensitivity)  ?Result Value Ref Range  ? Troponin I (High Sensitivity) 24 (H) <18 ng/L  ? ?   ?Assessment & Plan:  ? ?Problem List Items Addressed This Visit   ? ?  ? Endocrine  ? Type 2 diabetes mellitus with renal complication (HCC)  ?  Has been taking both tradjenta and jardiance. Will stop tradjenta. Will hold on adjusting insulin today due to dizziness. Continue to monitor. Recheck 2 weeks.  ?  ?  ? ?Other Visit Diagnoses   ? ? Dizziness    -  Primary  ? Likely due to hypotension. BP has been up a little bit and she's feeling better. Continue to monitor. Checking  labs today. Await results.   ? Relevant Orders  ? CBC with Differential/Platelet  ? Comprehensive metabolic panel  ? Hypotension due to drugs      ? BP running low. Will continue to monitor closely- call if dropping again. Continue midodrine.   ? ?  ?  ? ?Follow up plan: ?Return in about 2 weeks (around 01/25/2022). ? ? ? ? ? ?

## 2022-01-12 LAB — COMPREHENSIVE METABOLIC PANEL
ALT: 11 IU/L (ref 0–32)
AST: 19 IU/L (ref 0–40)
Albumin/Globulin Ratio: 2.3 — ABNORMAL HIGH (ref 1.2–2.2)
Albumin: 4.5 g/dL (ref 3.7–4.7)
Alkaline Phosphatase: 133 IU/L — ABNORMAL HIGH (ref 44–121)
BUN/Creatinine Ratio: 23 (ref 12–28)
BUN: 48 mg/dL — ABNORMAL HIGH (ref 8–27)
Bilirubin Total: 1.4 mg/dL — ABNORMAL HIGH (ref 0.0–1.2)
CO2: 29 mmol/L (ref 20–29)
Calcium: 9.9 mg/dL (ref 8.7–10.3)
Chloride: 86 mmol/L — ABNORMAL LOW (ref 96–106)
Creatinine, Ser: 2.11 mg/dL — ABNORMAL HIGH (ref 0.57–1.00)
Globulin, Total: 2 g/dL (ref 1.5–4.5)
Glucose: 346 mg/dL — ABNORMAL HIGH (ref 70–99)
Potassium: 3.6 mmol/L (ref 3.5–5.2)
Sodium: 137 mmol/L (ref 134–144)
Total Protein: 6.5 g/dL (ref 6.0–8.5)
eGFR: 24 mL/min/{1.73_m2} — ABNORMAL LOW (ref >59)

## 2022-01-12 LAB — CBC WITH DIFFERENTIAL/PLATELET
Basophils Absolute: 0.1 10*3/uL (ref 0.0–0.2)
Basos: 1 %
EOS (ABSOLUTE): 0.1 10*3/uL (ref 0.0–0.4)
Eos: 2 %
Hematocrit: 45.4 % (ref 34.0–46.6)
Hemoglobin: 13.8 g/dL (ref 11.1–15.9)
Immature Grans (Abs): 0 10*3/uL (ref 0.0–0.1)
Immature Granulocytes: 0 %
Lymphocytes Absolute: 0.5 10*3/uL — ABNORMAL LOW (ref 0.7–3.1)
Lymphs: 6 %
MCH: 23.3 pg — ABNORMAL LOW (ref 26.6–33.0)
MCHC: 30.4 g/dL — ABNORMAL LOW (ref 31.5–35.7)
MCV: 77 fL — ABNORMAL LOW (ref 79–97)
Monocytes Absolute: 0.6 10*3/uL (ref 0.1–0.9)
Monocytes: 8 %
Neutrophils Absolute: 6.3 10*3/uL (ref 1.4–7.0)
Neutrophils: 83 %
Platelets: 398 10*3/uL (ref 150–450)
RBC: 5.93 x10E6/uL — ABNORMAL HIGH (ref 3.77–5.28)
RDW: 19.7 % — ABNORMAL HIGH (ref 11.7–15.4)
WBC: 7.6 10*3/uL (ref 3.4–10.8)

## 2022-01-25 ENCOUNTER — Encounter: Payer: Self-pay | Admitting: Family Medicine

## 2022-01-25 ENCOUNTER — Ambulatory Visit: Payer: Medicare PPO | Admitting: Family Medicine

## 2022-01-25 VITALS — BP 99/66 | HR 103 | Temp 97.7°F | Wt 171.2 lb

## 2022-01-25 DIAGNOSIS — Z794 Long term (current) use of insulin: Secondary | ICD-10-CM | POA: Diagnosis not present

## 2022-01-25 DIAGNOSIS — E1121 Type 2 diabetes mellitus with diabetic nephropathy: Secondary | ICD-10-CM

## 2022-01-25 DIAGNOSIS — E1159 Type 2 diabetes mellitus with other circulatory complications: Secondary | ICD-10-CM

## 2022-01-25 MED ORDER — TOUJEO MAX SOLOSTAR 300 UNIT/ML ~~LOC~~ SOPN: 48.0000 [IU] | PEN_INJECTOR | Freq: Every day | SUBCUTANEOUS | 1 refills | Status: DC

## 2022-01-25 NOTE — Progress Notes (Signed)
? ?BP 99/66   Pulse (!) 103   Temp 97.7 ?F (36.5 ?C)   Wt 171 lb 3.2 oz (77.7 kg)   SpO2 98%   BMI 31.82 kg/m?   ? ?Subjective:  ? ? Patient ID: Karen Farley, female    DOB: [birth year], 76 y.o.   MRN: [id number] ? ?HPI: ?BRISTYL MCLEES is a 76 y.o. female ? ?Chief Complaint  ?Patient presents with  ? Diabetes  ? ?DIABETES ?Hypoglycemic episodes:no ?Polydipsia/polyuria: no ?Visual disturbance: no ?Chest pain: no ?Paresthesias: no ?Glucose Monitoring: yes ? Accucheck frequency: Daily ? Fasting glucose: 184 ?Taking Insulin?: yes ? Long acting insulin: 44 units ?Blood Pressure Monitoring: not checking ?Retinal Examination: Up to Date ?Foot Exam: Up to Date ?Diabetic Education: Completed ?Pneumovax: Up to Date ?Influenza: Up to Date ?Aspirin: yes ? ?Dizziness has resolved. No other symptoms.  ? ?Relevant past medical, surgical, family and social history reviewed and updated as indicated. Interim medical history since our last visit reviewed. ?Allergies and medications reviewed and updated. ? ?Review of Systems  ?Constitutional: Negative.   ?Respiratory: Negative.    ?Cardiovascular: Negative.   ?Gastrointestinal: Negative.   ?Musculoskeletal: Negative.   ?Neurological: Negative.   ?Psychiatric/Behavioral: Negative.    ? ?Per HPI unless specifically indicated above ? ?   ?Objective:  ?  ?BP 99/66   Pulse (!) 103   Temp 97.7 ?F (36.5 ?C)   Wt 171 lb 3.2 oz (77.7 kg)   SpO2 98%   BMI 31.82 kg/m?   ?Wt Readings from Last 3 Encounters:  ?01/25/22 171 lb 3.2 oz (77.7 kg)  ?01/11/22 172 lb (78 kg)  ?12/25/21 180 lb 9.6 oz (81.9 kg)  ?  ?Physical Exam ?Vitals and nursing note reviewed.  ?Constitutional:   ?   General: She is not in acute distress. ?   Appearance: Normal appearance. She is not ill-appearing, toxic-appearing or diaphoretic.  ?HENT:  ?   Head: Normocephalic and atraumatic.  ?   Right Ear: External ear normal.  ?   Left Ear: External ear normal.  ?   Nose: Nose normal.  ?   Mouth/Throat:  ?    Mouth: Mucous membranes are moist.  ?   Pharynx: Oropharynx is clear.  ?Eyes:  ?   General: No scleral icterus.    ?   Right eye: No discharge.     ?   Left eye: No discharge.  ?   Extraocular Movements: Extraocular movements intact.  ?   Conjunctiva/sclera: Conjunctivae normal.  ?   Pupils: Pupils are equal, round, and reactive to light.  ?Cardiovascular:  ?   Rate and Rhythm: Normal rate and regular rhythm.  ?   Pulses: Normal pulses.  ?   Heart sounds: Murmur heard.  ?  No friction rub. No gallop.  ?Pulmonary:  ?   Effort: Pulmonary effort is normal. No respiratory distress.  ?   Breath sounds: Normal breath sounds. No stridor. No wheezing, rhonchi or rales.  ?Chest:  ?   Chest wall: No tenderness.  ?Musculoskeletal:     ?   General: Normal range of motion.  ?   Cervical back: Normal range of motion and neck supple.  ?Skin: ?   General: Skin is warm and dry.  ?   Capillary Refill: Capillary refill takes less than 2 seconds.  ?   Coloration: Skin is not jaundiced or pale.  ?   Findings: No bruising, erythema, lesion or rash.  ?Neurological:  ?   General:  No focal deficit present.  ?   Mental Status: She is alert and oriented to person, place, and time. Mental status is at baseline.  ?Psychiatric:     ?   Mood and Affect: Mood normal.     ?   Behavior: Behavior normal.     ?   Thought Content: Thought content normal.     ?   Judgment: Judgment normal.  ? ? ?Results for orders placed or performed in visit on 01/11/22  ?CBC with Differential/Platelet  ?Result Value Ref Range  ? WBC 7.6 3.4 - 10.8 x10E3/uL  ? RBC 5.93 (H) 3.77 - 5.28 x10E6/uL  ? Hemoglobin 13.8 11.1 - 15.9 g/dL  ? Hematocrit 45.4 34.0 - 46.6 %  ? MCV 77 (L) 79 - 97 fL  ? MCH 23.3 (L) 26.6 - 33.0 pg  ? MCHC 30.4 (L) 31.5 - 35.7 g/dL  ? RDW 19.7 (H) 11.7 - 15.4 %  ? Platelets 398 150 - 450 x10E3/uL  ? Neutrophils 83 Not Estab. %  ? Lymphs 6 Not Estab. %  ? Monocytes 8 Not Estab. %  ? Eos 2 Not Estab. %  ? Basos 1 Not Estab. %  ? Neutrophils Absolute 6.3  1.4 - 7.0 x10E3/uL  ? Lymphocytes Absolute 0.5 (L) 0.7 - 3.1 x10E3/uL  ? Monocytes Absolute 0.6 0.1 - 0.9 x10E3/uL  ? EOS (ABSOLUTE) 0.1 0.0 - 0.4 x10E3/uL  ? Basophils Absolute 0.1 0.0 - 0.2 x10E3/uL  ? Immature Granulocytes 0 Not Estab. %  ? Immature Grans (Abs) 0.0 0.0 - 0.1 x10E3/uL  ?Comprehensive metabolic panel  ?Result Value Ref Range  ? Glucose 346 (H) 70 - 99 mg/dL  ? BUN 48 (H) 8 - 27 mg/dL  ? Creatinine, Ser 2.11 (H) 0.57 - 1.00 mg/dL  ? eGFR 24 (L) >59 mL/min/1.73  ? BUN/Creatinine Ratio 23 12 - 28  ? Sodium 137 134 - 144 mmol/L  ? Potassium 3.6 3.5 - 5.2 mmol/L  ? Chloride 86 (L) 96 - 106 mmol/L  ? CO2 29 20 - 29 mmol/L  ? Calcium 9.9 8.7 - 10.3 mg/dL  ? Total Protein 6.5 6.0 - 8.5 g/dL  ? Albumin 4.5 3.7 - 4.7 g/dL  ? Globulin, Total 2.0 1.5 - 4.5 g/dL  ? Albumin/Globulin Ratio 2.3 (H) 1.2 - 2.2  ? Bilirubin Total 1.4 (H) 0.0 - 1.2 mg/dL  ? Alkaline Phosphatase 133 (H) 44 - 121 IU/L  ? AST 19 0 - 40 IU/L  ? ALT 11 0 - 32 IU/L  ? ?   ?Assessment & Plan:  ? ?Problem List Items Addressed This Visit   ? ?  ? Endocrine  ? 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  Still running high. Will increase her insulin to 48 units and recheck by phone in 6 days. Call with any concerns.  ?  ?  ? Relevant Medications  ? insulin glargine, 2 Unit Dial, (TOUJEO MAX SOLOSTAR) 300 UNIT/ML Solostar Pen  ?  ? ?Follow up plan: ?Return Thursday phone call. ? ? ? ? ? ?

## 2022-01-28 ENCOUNTER — Encounter: Payer: Self-pay | Admitting: Family Medicine

## 2022-01-28 NOTE — Assessment & Plan Note (Addendum)
Still running high. Will increase her insulin to 48 units and recheck by phone in 6 days. Call with any concerns.  ?

## 2022-01-29 DIAGNOSIS — N184 Chronic kidney disease, stage 4 (severe): Secondary | ICD-10-CM | POA: Diagnosis not present

## 2022-01-31 ENCOUNTER — Encounter: Payer: Self-pay | Admitting: Family Medicine

## 2022-01-31 ENCOUNTER — Ambulatory Visit (INDEPENDENT_AMBULATORY_CARE_PROVIDER_SITE_OTHER): Payer: Medicare PPO | Admitting: Family Medicine

## 2022-01-31 DIAGNOSIS — E1121 Type 2 diabetes mellitus with diabetic nephropathy: Secondary | ICD-10-CM | POA: Diagnosis not present

## 2022-01-31 DIAGNOSIS — Z794 Long term (current) use of insulin: Secondary | ICD-10-CM | POA: Diagnosis not present

## 2022-01-31 NOTE — Assessment & Plan Note (Signed)
Will keep her insulin at 48 units for another week. If still running above 150 at that time she will increase to 50 units. We'll recheck by phone in 2 weeks. Call with any concerns. ?

## 2022-01-31 NOTE — Progress Notes (Signed)
? ?There were no vitals taken for this visit.  ? ?Subjective:  ? ? Patient ID: Karen Farley, female    DOB: 17-Oct-1946, 76 y.o.   MRN: [id number] ? ?HPI: ?Karen Farley is a 76 y.o. female ? ?Chief Complaint  ?Patient presents with  ? Diabetes  ?  Patient states her BS was 179 this morning   ? ?DIABETES ?Hypoglycemic episodes:no ?Polydipsia/polyuria: no ?Visual disturbance: no ?Chest pain: no ?Paresthesias: no ?Glucose Monitoring: yes ? Accucheck frequency: Daily ? Fasting glucose: 158, 165, 177, 179 ?Taking Insulin?: yes ? Long acting insulin: 48 units ?Blood Pressure Monitoring: not checking ?Retinal Examination: Up to Date ?Foot Exam: Not up to Date ?Diabetic Education: Completed ?Pneumovax: Up to Date ?Influenza: Up to Date ?Aspirin: no ? ? ?Relevant past medical, surgical, family and social history reviewed and updated as indicated. Interim medical history since our last visit reviewed. ?Allergies and medications reviewed and updated. ? ?Review of Systems  ?Constitutional: Negative.   ?Respiratory: Negative.    ?Cardiovascular: Negative.   ?Gastrointestinal: Negative.   ?Musculoskeletal: Negative.   ?Psychiatric/Behavioral: Negative.    ? ?Per HPI unless specifically indicated above ? ?   ?Objective:  ?  ?There were no vitals taken for this visit.  ?Wt Readings from Last 3 Encounters:  ?01/25/22 171 lb 3.2 oz (77.7 kg)  ?01/11/22 172 lb (78 kg)  ?12/25/21 180 lb 9.6 oz (81.9 kg)  ?  ?Physical Exam ?Vitals and nursing note reviewed.  ?Pulmonary:  ?   Effort: Pulmonary effort is normal. No respiratory distress.  ?   Comments: Speaking in full sentences ?Neurological:  ?   Mental Status: She is alert.  ?Psychiatric:     ?   Mood and Affect: Mood normal.     ?   Behavior: Behavior normal.     ?   Thought Content: Thought content normal.     ?   Judgment: Judgment normal.  ? ? ?Results for orders placed or performed in visit on 01/11/22  ?CBC with Differential/Platelet  ?Result Value Ref Range  ? WBC 7.6  3.4 - 10.8 x10E3/uL  ? RBC 5.93 (H) 3.77 - 5.28 x10E6/uL  ? Hemoglobin 13.8 11.1 - 15.9 g/dL  ? Hematocrit 45.4 34.0 - 46.6 %  ? MCV 77 (L) 79 - 97 fL  ? MCH 23.3 (L) 26.6 - 33.0 pg  ? MCHC 30.4 (L) 31.5 - 35.7 g/dL  ? RDW 19.7 (H) 11.7 - 15.4 %  ? Platelets 398 150 - 450 x10E3/uL  ? Neutrophils 83 Not Estab. %  ? Lymphs 6 Not Estab. %  ? Monocytes 8 Not Estab. %  ? Eos 2 Not Estab. %  ? Basos 1 Not Estab. %  ? Neutrophils Absolute 6.3 1.4 - 7.0 x10E3/uL  ? Lymphocytes Absolute 0.5 (L) 0.7 - 3.1 x10E3/uL  ? Monocytes Absolute 0.6 0.1 - 0.9 x10E3/uL  ? EOS (ABSOLUTE) 0.1 0.0 - 0.4 x10E3/uL  ? Basophils Absolute 0.1 0.0 - 0.2 x10E3/uL  ? Immature Granulocytes 0 Not Estab. %  ? Immature Grans (Abs) 0.0 0.0 - 0.1 x10E3/uL  ?Comprehensive metabolic panel  ?Result Value Ref Range  ? Glucose 346 (H) 70 - 99 mg/dL  ? BUN 48 (H) 8 - 27 mg/dL  ? Creatinine, Ser 2.11 (H) 0.57 - 1.00 mg/dL  ? eGFR 24 (L) >59 mL/min/1.73  ? BUN/Creatinine Ratio 23 12 - 28  ? Sodium 137 134 - 144 mmol/L  ? Potassium 3.6 3.5 - 5.2 mmol/L  ?  Chloride 86 (L) 96 - 106 mmol/L  ? CO2 29 20 - 29 mmol/L  ? Calcium 9.9 8.7 - 10.3 mg/dL  ? Total Protein 6.5 6.0 - 8.5 g/dL  ? Albumin 4.5 3.7 - 4.7 g/dL  ? Globulin, Total 2.0 1.5 - 4.5 g/dL  ? Albumin/Globulin Ratio 2.3 (H) 1.2 - 2.2  ? Bilirubin Total 1.4 (H) 0.0 - 1.2 mg/dL  ? Alkaline Phosphatase 133 (H) 44 - 121 IU/L  ? AST 19 0 - 40 IU/L  ? ALT 11 0 - 32 IU/L  ? ?   ?Assessment & Plan:  ? ?Problem List Items Addressed This Visit   ? ?  ? Endocrine  ? Type 2 diabetes mellitus with renal complication (HCC)  ?  Will keep her insulin at 48 units for another week. If still running above 150 at that time she will increase to 50 units. We'll recheck by phone in 2 weeks. Call with any concerns. ?  ?  ?  ? ?Follow up plan: ?Return in about 2 weeks (around 02/14/2022). ? ? ? ?This visit was completed via telephone due to the restrictions of the COVID-19 pandemic. All issues as above were discussed and addressed  but no physical exam was performed. If it was felt that the patient should be evaluated in the office, they were directed there. The patient verbally consented to this visit. Patient was unable to complete an audio/visual visit due to Lack of equipment. Due to the catastrophic nature of the COVID-19 pandemic, this visit was done through audio contact only. ?Location of the patient: home ?Location of the provider: work ?Those involved with this call:  ?Provider: Park Liter, DO ?CMA: Louanna Raw, [place] ?Front Desk/Registration: [employer]  ?Time spent on call:  12 minutes on the phone discussing health concerns. 15 minutes total spent in review of patient's record and preparation of their chart. ? ? ? ?

## 2022-02-01 NOTE — Progress Notes (Signed)
Patient scheduled.

## 2022-02-05 DIAGNOSIS — N184 Chronic kidney disease, stage 4 (severe): Secondary | ICD-10-CM | POA: Diagnosis not present

## 2022-02-05 DIAGNOSIS — R600 Localized edema: Secondary | ICD-10-CM | POA: Diagnosis not present

## 2022-02-05 DIAGNOSIS — E1122 Type 2 diabetes mellitus with diabetic chronic kidney disease: Secondary | ICD-10-CM | POA: Diagnosis not present

## 2022-02-05 DIAGNOSIS — E876 Hypokalemia: Secondary | ICD-10-CM | POA: Diagnosis not present

## 2022-02-12 DIAGNOSIS — R600 Localized edema: Secondary | ICD-10-CM | POA: Diagnosis not present

## 2022-02-12 DIAGNOSIS — I890 Lymphedema, not elsewhere classified: Secondary | ICD-10-CM | POA: Diagnosis not present

## 2022-02-12 DIAGNOSIS — E876 Hypokalemia: Secondary | ICD-10-CM | POA: Diagnosis not present

## 2022-02-12 DIAGNOSIS — N184 Chronic kidney disease, stage 4 (severe): Secondary | ICD-10-CM | POA: Diagnosis not present

## 2022-02-12 DIAGNOSIS — E1122 Type 2 diabetes mellitus with diabetic chronic kidney disease: Secondary | ICD-10-CM | POA: Diagnosis not present

## 2022-02-14 ENCOUNTER — Ambulatory Visit (INDEPENDENT_AMBULATORY_CARE_PROVIDER_SITE_OTHER): Payer: Medicare PPO | Admitting: Family Medicine

## 2022-02-14 ENCOUNTER — Encounter: Payer: Self-pay | Admitting: Family Medicine

## 2022-02-14 DIAGNOSIS — E1121 Type 2 diabetes mellitus with diabetic nephropathy: Secondary | ICD-10-CM | POA: Diagnosis not present

## 2022-02-14 DIAGNOSIS — Z794 Long term (current) use of insulin: Secondary | ICD-10-CM | POA: Diagnosis not present

## 2022-02-14 MED ORDER — POLYSACCHARIDE IRON COMPLEX 150 MG PO CAPS: ORAL_CAPSULE | ORAL | 1 refills | Status: DC

## 2022-02-14 MED ORDER — EMPAGLIFLOZIN 25 MG PO TABS: 25.0000 mg | ORAL_TABLET | Freq: Every day | ORAL | 1 refills | Status: DC

## 2022-02-14 NOTE — Assessment & Plan Note (Signed)
Would like to stay on her current dose of 48units of insulin. Will recheck A1c in about 2 weeks. Call with any concerns.  ?

## 2022-02-14 NOTE — Progress Notes (Signed)
? ?There were no vitals taken for this visit.  ? ?Subjective:  ? ? Patient ID: Karen Farley, female    DOB: 25-Dec-1945, 76 y.o.   MRN: [id number] ? ?HPI: ?Karen Farley is a 76 y.o. female ? ?Chief Complaint  ?Patient presents with  ? Diabetes  ?  Patient states her blood sugar was 186 today.   ? ?DIABETES ?Hypoglycemic episodes:no ?Polydipsia/polyuria: no ?Visual disturbance: no ?Chest pain: no ?Paresthesias: no ?Glucose Monitoring: yes ? Accucheck frequency: Daily ? Fasting glucose: 186, 171, 111, 137, 137, 196, 121 ?Taking Insulin?: yes ? Long acting insulin: ?Blood Pressure Monitoring: not checking ?Retinal Examination: Up to Date ?Foot Exam: Up to Date ?Diabetic Education: Completed ?Pneumovax: Up to Date ?Influenza: Up to Date ?Aspirin: yes ? ?Relevant past medical, surgical, family and social history reviewed and updated as indicated. Interim medical history since our last visit reviewed. ?Allergies and medications reviewed and updated. ? ?Review of Systems  ?Constitutional: Negative.   ?Respiratory: Negative.    ?Cardiovascular: Negative.   ?Gastrointestinal: Negative.   ?Neurological: Negative.   ?Psychiatric/Behavioral: Negative.    ? ?Per HPI unless specifically indicated above ? ?   ?Objective:  ?  ?There were no vitals taken for this visit.  ?Wt Readings from Last 3 Encounters:  ?01/25/22 171 lb 3.2 oz (77.7 kg)  ?01/11/22 172 lb (78 kg)  ?12/25/21 180 lb 9.6 oz (81.9 kg)  ?  ?Physical Exam ?Vitals and nursing note reviewed.  ?Pulmonary:  ?   Effort: Pulmonary effort is normal. No respiratory distress.  ?   Comments: Speaking in full sentences ?Neurological:  ?   Mental Status: She is alert.  ?Psychiatric:     ?   Mood and Affect: Mood normal.     ?   Behavior: Behavior normal.     ?   Thought Content: Thought content normal.     ?   Judgment: Judgment normal.  ? ? ?Results for orders placed or performed in visit on 01/11/22  ?CBC with Differential/Platelet  ?Result Value Ref Range  ? WBC 7.6  3.4 - 10.8 x10E3/uL  ? RBC 5.93 (H) 3.77 - 5.28 x10E6/uL  ? Hemoglobin 13.8 11.1 - 15.9 g/dL  ? Hematocrit 45.4 34.0 - 46.6 %  ? MCV 77 (L) 79 - 97 fL  ? MCH 23.3 (L) 26.6 - 33.0 pg  ? MCHC 30.4 (L) 31.5 - 35.7 g/dL  ? RDW 19.7 (H) 11.7 - 15.4 %  ? Platelets 398 150 - 450 x10E3/uL  ? Neutrophils 83 Not Estab. %  ? Lymphs 6 Not Estab. %  ? Monocytes 8 Not Estab. %  ? Eos 2 Not Estab. %  ? Basos 1 Not Estab. %  ? Neutrophils Absolute 6.3 1.4 - 7.0 x10E3/uL  ? Lymphocytes Absolute 0.5 (L) 0.7 - 3.1 x10E3/uL  ? Monocytes Absolute 0.6 0.1 - 0.9 x10E3/uL  ? EOS (ABSOLUTE) 0.1 0.0 - 0.4 x10E3/uL  ? Basophils Absolute 0.1 0.0 - 0.2 x10E3/uL  ? Immature Granulocytes 0 Not Estab. %  ? Immature Grans (Abs) 0.0 0.0 - 0.1 x10E3/uL  ?Comprehensive metabolic panel  ?Result Value Ref Range  ? Glucose 346 (H) 70 - 99 mg/dL  ? BUN 48 (H) 8 - 27 mg/dL  ? Creatinine, Ser 2.11 (H) 0.57 - 1.00 mg/dL  ? eGFR 24 (L) >59 mL/min/1.73  ? BUN/Creatinine Ratio 23 12 - 28  ? Sodium 137 134 - 144 mmol/L  ? Potassium 3.6 3.5 - 5.2 mmol/L  ?  Chloride 86 (L) 96 - 106 mmol/L  ? CO2 29 20 - 29 mmol/L  ? Calcium 9.9 8.7 - 10.3 mg/dL  ? Total Protein 6.5 6.0 - 8.5 g/dL  ? Albumin 4.5 3.7 - 4.7 g/dL  ? Globulin, Total 2.0 1.5 - 4.5 g/dL  ? Albumin/Globulin Ratio 2.3 (H) 1.2 - 2.2  ? Bilirubin Total 1.4 (H) 0.0 - 1.2 mg/dL  ? Alkaline Phosphatase 133 (H) 44 - 121 IU/L  ? AST 19 0 - 40 IU/L  ? ALT 11 0 - 32 IU/L  ? ?   ?Assessment & Plan:  ? ?Problem List Items Addressed This Visit   ? ?  ? Endocrine  ? 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  Would like to stay on her current dose of 48units of insulin. Will recheck A1c in about 2 weeks. Call with any concerns.  ? ?  ?  ? Relevant Medications  ? empagliflozin (JARDIANCE) 25 MG TABS tablet  ?  ? ?Follow up plan: ?Return in about 2 weeks (around 02/28/2022) for in person, DM. ? ? ?This visit was completed via video visit through MyChart due to the restrictions of the COVID-19  pandemic. All issues as above were discussed and addressed. Physical exam was done as above through visual confirmation on video through MyChart. If it was felt that the patient should be evaluated in the office, they were directed there. The patient verbally consented to this visit. ?Location of the patient: home ?Location of the provider: work ?Those involved with this call:  ?Provider: Park Liter, DO ?CMA: Louanna Raw, [place] ?Front Desk/Registration: [employer]  ?Time spent on call:  15 minutes with patient face to face via video conference. More than 50% of this time was spent in counseling and coordination of care. 23 minutes total spent in review of patient's record and preparation of their chart. ? ? ? ? ?

## 2022-02-15 NOTE — Progress Notes (Signed)
Patient scheduled.

## 2022-02-20 NOTE — Progress Notes (Signed)
Cardiology Office Note ? ?Date:  02/22/2022  ? ?ID:  Karen Farley, DOB 10-14-1946, MRN [id number] ? ?PCP:  Valerie Roys, DO  ? ?Chief Complaint  ?Patient presents with  ? 3 month follow up   ?  "Doing well." Medications reviewed by the patient verbally.  ? ? ?HPI:  ?76 year old female with a history of  ?HFpEF,  ?hypertension,  ?stage III chronic kidney disease,  ?lymphedema,  ?permanent atrial fibrillation,  ?hyperlipidemia,  ?diabetes,  ?pericardial effusion status post pericardiocentesis,  ?pleural effusion status post thoracentesis, and  ?GI bleed,  ?who presents for follow-up of heart failure and hypokalemia. ? ?Last seen in clinic by myself February 2023 ?Followed by nephrology, Dr. Candiss Norse ?Frequent visits to see Dr. Park Liter ? ?On discussions today, reports overall doing very well, feels that things are stable ?Weight down from 188 February 2023 to 168 (171 at home) on today's visit ?Taking metolazone 2.5 tyhree days a week ?With torsemide 20 mg daily ?Lympghedema compression pumps ?Teds hose daily ?Feels leg swelling is stable ? ?Weight trending down ?Sugars dropping ? ?Lasb reviewed: ?CR 2.11 folloed by nephrology ?HGN 14 ? ?On insulin 48 units every night ? ?Breathing good, no SOB ?Walked with walker ?Fall end of feb 2023 ? ?EKG personally reviewed by myself on todays visit ?Atrial fibrillation rate 79 bpm ? ?Prior cardiac history reviewed ?Admitted to the hospital October 2022, large left pleural effusion requiring thoracentesis ?Rectal bleed, Eliquis discontinued, required transfusion ?Large pericardial effusion noted, no tamponade on echo ?Pericardiocentesis performed 700 ?Follow-up echo stable moderate effusion ?Required diuresis, transition to torsemide metolazone, Eliquis resumed ? ?Total chol 194 ?HGb12.3 ?CR 2.31 ?A1c 9.6, up from 6.9 ? 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 ? ?PSH:    ?Past Surgical History:  ?Procedure Laterality Date  ? CATARACT EXTRACTION W/PHACO Left 10/09/2021  ? Procedure: CATARACT EXTRACTION PHACO AND INTRAOCULAR LENS PLACEMENT (IOC) LEFT DIABETIC 4.84 00:37.8;  Surgeon: Birder Robson, MD;  Location: [facility];  Service: Ophthalmology;  Laterality: Left;  Diabetic  ? CATARACT EXTRACTION W/PHACO Right 10/30/2021  ? Procedure: CATARACT EXTRACTION PHACO AND INTRAOCULAR LENS PLACEMENT (IOC) RIGHT DIABETIC 5.65 00:38.5;  Surgeon: Birder Robson, MD;  Location: [facility];  Service: Ophthalmology;  Laterality: Right;  Diabetic  ? CHOLECYSTECTOMY    ? DILATION AND CURETTAGE OF UTERUS    ? IR FLUORO GUIDE CV LINE RIGHT  08/16/2021  ? IR THORACENTESIS ASP PLEURAL SPACE W/IMG GUIDE  08/16/2021  ? PERICARDIOCENTESIS N/A 08/05/2021  ? Procedure: PERICARDIOCENTESIS;  Surgeon: Isaias Cowman, MD;  Location: [facility];  Service: Cardiovascular;  Laterality: N/A;  ? TEAR DUCT PROBING WITH STRABISMUS REPAIR Right   ? TONSILLECTOMY    ? ? ?Current Outpatient Medications  ?Medication Sig Dispense Refill  ? ACCU-CHEK GUIDE test strip USE TO CHECK BLOOD SUGAR DAILY    ? apixaban (ELIQUIS) 5 MG TABS tablet Take 1 tablet (5 mg total) by mouth 2 (two) times daily. 180 tablet 3  ? Cholecalciferol 125 MCG (5000 UT) TABS Take by mouth.    ? Elastic Bandages & Supports (MEDICAL  COMPRESSION STOCKINGS) MISC by Does not apply route.    ? empagliflozin (JARDIANCE) 25 MG TABS tablet Take 1 tablet (25 mg total) by mouth daily before breakfast. 90 tablet 1   ? folic acid (FOLVITE) 1 MG tablet Take 1 tablet (1 mg total) by mouth daily. 90 tablet 3  ? insulin glargine, 2 Unit Dial, (TOUJEO MAX SOLOSTAR) 300 UNIT/ML Solostar Pen Inject 48 Units into the skin at bedtime. 4.8 mL 1  ? Insulin Pen Needle (PEN NEEDLES) 30G X 8 MM MISC 1 each by Does not apply route daily. 100 each 12  ? iron polysaccharides (NIFEREX) 150 MG capsule TAKE 1 CAPSULE(150 MG) BY MOUTH DAILY 90 capsule 1  ? latanoprost (XALATAN) 0.005 % ophthalmic solution Place 1 drop into both eyes at bedtime.    ? metolazone (ZAROXOLYN) 2.5 MG tablet Take 1 tablet by mouth every Monday, Wednesday, and Friday.    ? metoprolol succinate (TOPROL-XL) 25 MG 24 hr tablet Take 0.5 tablets (12.5 mg total) by mouth daily. 90 tablet 1  ? midodrine (PROAMATINE) 10 MG tablet Take 1 tablet (10 mg total) by mouth 3 (three) times daily with meals. 270 tablet 1  ? Multiple Vitamin (MULTIVITAMIN WITH MINERALS) TABS tablet Take 1 tablet by mouth daily.    ? Niacin (VITAMIN B-3 PO) Take 5,000 Units by mouth once a week.    ? pantoprazole (PROTONIX) 40 MG tablet Take 1 tablet (40 mg total) by mouth 2 (two) times daily. 180 tablet 3  ? polyethylene glycol (MIRALAX / GLYCOLAX) 17 g packet Take 17 g by mouth daily. 14 each 0  ? potassium chloride SA (KLOR-CON) 20 MEQ tablet Take 2 tablets (40 mEq total) by mouth daily. 90 tablet 1  ? spironolactone (ALDACTONE) 25 MG tablet Take 1 tablet (25 mg total) by mouth daily. 90 tablet 1  ? torsemide (DEMADEX) 20 MG tablet TAKE 1 TO 2 TABLETS BY MOUTH DAILY. ONLY TAKE 2 TABLETS FOR 1 TO 3 DAYS AND IF PERSISTS, CALL WITH BAD SWELLING 180 tablet 1  ? vitamin B-12 (CYANOCOBALAMIN) 500 MCG tablet Take 1 tablet (500 mcg total) by mouth daily. 90 tablet 3  ? ?No current facility-administered medications for this visit.  ? ? ?Allergies:   Patient has no known allergies.  ? ?Social History:  The patient  reports that she has never smoked. She has never used smokeless tobacco. She reports that she does  not currently use alcohol. She reports that she does not use drugs.  ? ?Family History:   family history includes Breast cancer in her maternal grandmother and mother; Cancer in her brother and brother; Heart disease in her brother and mother; Heart disease (age of onset: 85) in her father; Osteoporosis in her mother; Parkinson's disease in her brother.  ? ? ?Review of Systems: ?Review of Systems  ?Constitutional: Negative.   ?HENT: Negative.    ?Respiratory: Negative.    ?Cardiovascular: Negative.   ?Gastrointestinal: Negative.   ?Musculoskeletal: Negative.   ?Neurological: Negative.   ?Psychiatric/Behavioral: Negative.    ?All other systems reviewed and are negative. ? ? ?PHYSICAL EXAM: ?VS:  BP 100/60 (BP Location: Left Arm, Patient Position: Sitting, Cuff Size: Large)   Pulse 79   Ht '5\' 1"'$  (1.549 m)   Wt 168 lb 4 oz (76.3 kg)   SpO2 97%   BMI 31.79 kg/m?  , BMI Body mass index is 31.79 kg/m?Marland Kitchen ?Constitutional:  oriented to person, place, and time. No distress.  ?HENT:  ?Head: Grossly normal ?  Eyes:  no discharge. No scleral icterus.  ?Neck: No JVD, no carotid bruits  ?Cardiovascular: Regular rate and rhythm, no murmurs appreciated ?Pulmonary/Chest: Clear to auscultation bilaterally, no wheezes or rails ?Abdominal: Soft.  no distension.  no tenderness.  ?Musculoskeletal: Normal range of motion ?Neurological:  normal muscle tone. Coordination normal. No atrophy ?Skin: Skin warm and dry ?Psychiatric: normal affect, pleasant ? ?Recent Labs: ?08/01/2021: TSH 0.902 ?08/05/2021: B Natriuretic Peptide 184.5 ?09/11/2021: Magnesium 2.2 ?01/11/2022: ALT 11; BUN 48; Creatinine, Ser 2.11; Hemoglobin 13.8; Platelets 398; Potassium 3.6; Sodium 137  ? ? ?Lipid Panel ?Lab Results  ?Component Value Date  ? CHOL 194 11/19/2021  ? HDL 39 (L) 11/19/2021  ? LDLCALC 133 (H) 11/19/2021  ? TRIG 121 11/19/2021  ? ?  ? ?Wt Readings from Last 3 Encounters:  ?02/22/22 168 lb 4 oz (76.3 kg)  ?01/25/22 171 lb 3.2 oz (77.7 kg)  ?01/11/22 172  lb (78 kg)  ?  ? ?ASSESSMENT AND PLAN: ? ?Problem List Items Addressed This Visit   ? ?  ? Cardiology Problems  ? Hyperlipidemia  ? Pericardial effusion  ? Pulmonary hypertension, primary ([place])  ? Atrial fibr

## 2022-02-22 ENCOUNTER — Ambulatory Visit: Payer: Medicare PPO | Admitting: Cardiovascular Disease

## 2022-02-22 ENCOUNTER — Encounter: Payer: Self-pay | Admitting: Cardiovascular Disease

## 2022-02-22 VITALS — BP 100/60 | HR 79 | Ht 61.0 in | Wt 168.2 lb

## 2022-02-22 DIAGNOSIS — I3139 Other pericardial effusion (noninflammatory): Secondary | ICD-10-CM

## 2022-02-22 DIAGNOSIS — I10 Essential (primary) hypertension: Secondary | ICD-10-CM

## 2022-02-22 DIAGNOSIS — E782 Mixed hyperlipidemia: Secondary | ICD-10-CM

## 2022-02-22 DIAGNOSIS — I5032 Chronic diastolic (congestive) heart failure: Secondary | ICD-10-CM | POA: Diagnosis not present

## 2022-02-22 DIAGNOSIS — I4821 Permanent atrial fibrillation: Secondary | ICD-10-CM | POA: Diagnosis not present

## 2022-02-22 DIAGNOSIS — I270 Primary pulmonary hypertension: Secondary | ICD-10-CM

## 2022-02-22 DIAGNOSIS — Z794 Long term (current) use of insulin: Secondary | ICD-10-CM

## 2022-02-22 DIAGNOSIS — E1122 Type 2 diabetes mellitus with diabetic chronic kidney disease: Secondary | ICD-10-CM

## 2022-02-22 DIAGNOSIS — I739 Peripheral vascular disease, unspecified: Secondary | ICD-10-CM | POA: Diagnosis not present

## 2022-02-22 DIAGNOSIS — I890 Lymphedema, not elsewhere classified: Secondary | ICD-10-CM | POA: Diagnosis not present

## 2022-02-22 DIAGNOSIS — N183 Chronic kidney disease, stage 3 unspecified: Secondary | ICD-10-CM

## 2022-02-22 DIAGNOSIS — N184 Chronic kidney disease, stage 4 (severe): Secondary | ICD-10-CM

## 2022-02-22 NOTE — Patient Instructions (Signed)
Medication Instructions:  No changes  If you need a refill on your cardiac medications before your next appointment, please call your pharmacy.    Lab work: No new labs needed   Testing/Procedures: No new testing needed   Follow-Up: At [facility], you and your health needs are our priority.  As part of our continuing mission to provide you with exceptional heart care, we have created designated Provider Care Teams.  These Care Teams include your primary Cardiologist (physician) and Advanced Practice Providers (APPs -  Physician Assistants and Nurse Practitioners) who all work together to provide you with the care you need, when you need it.  You will need a follow up appointment in 6 months  Providers on your designated Care Team:   Christopher Berge, NP Ryan Dunn, PA-C Cadence Furth, PA-C  COVID-19 Vaccine Information can be found at: [email or web address] For questions related to vaccine distribution or appointments, please email [email or web address] or call [phone].   

## 2022-02-24 ENCOUNTER — Other Ambulatory Visit: Payer: Self-pay | Admitting: Family Medicine

## 2022-02-26 NOTE — Telephone Encounter (Signed)
Requested medications are due for refill today.  No - just refilled ? ?Requested medications are on the active medications list.  yes ? ?Last refill. 02/14/2022 #90 1 refill ? ?Future visit scheduled.   yes ? ?Notes to clinic.  Medication not due for refill - Medication refill not assigned a protocol requires manual review. ? ? ? ?Requested Prescriptions  ?Pending Prescriptions Disp Refills  ? FERREX 150 150 MG capsule [Pharmacy Med Name: FERREX '150MG'$  CAPSULES] 30 capsule   ?  Sig: TAKE 1 CAPSULE(150 MG) BY MOUTH DAILY  ?  ? Off-Protocol Failed - 02/24/2022  4:14 PM  ?  ?  Failed - Medication not assigned to a protocol, review manually.  ?  ?  Passed - Valid encounter within last 12 months  ?  Recent Outpatient Visits   ? ?      ? 1 week ago Type 2 diabetes mellitus with diabetic nephropathy, with long-term current use of insulin ([place])  ? [place], [place] P, DO  ? 3 weeks ago Type 2 diabetes mellitus with diabetic nephropathy, with long-term current use of insulin ([place])  ? [place], [name], DO  ? 1 month ago Type 2 diabetes mellitus with diabetic nephropathy, with long-term current use of insulin ([place])  ? [place], [name], DO  ? 1 month ago Dizziness  ? [place], [name], DO  ? 1 month ago Type 2 diabetes mellitus with diabetic nephropathy, with long-term current use of insulin ([place])  ? [place], [place] P, DO  ? ?  ?  ?Future Appointments   ? ?        ? In 3 days Valerie Roys, DO [facility]  ? ?  ? ? ?  ?  ?  ?  ?

## 2022-03-01 ENCOUNTER — Ambulatory Visit: Payer: Medicare PPO | Admitting: Family Medicine

## 2022-03-01 ENCOUNTER — Encounter: Payer: Self-pay | Admitting: Family Medicine

## 2022-03-01 ENCOUNTER — Other Ambulatory Visit: Payer: Self-pay | Admitting: Family Medicine

## 2022-03-01 VITALS — BP 99/64 | HR 94 | Temp 98.1°F | Ht 61.5 in | Wt 171.4 lb

## 2022-03-01 DIAGNOSIS — E1121 Type 2 diabetes mellitus with diabetic nephropathy: Secondary | ICD-10-CM

## 2022-03-01 DIAGNOSIS — Z794 Long term (current) use of insulin: Secondary | ICD-10-CM | POA: Diagnosis not present

## 2022-03-01 LAB — BAYER DCA HB A1C WAIVED: HB A1C (BAYER DCA - WAIVED): 11.4 % — ABNORMAL HIGH (ref 4.8–5.6)

## 2022-03-01 MED ORDER — TOUJEO MAX SOLOSTAR 300 UNIT/ML ~~LOC~~ SOPN: 48.0000 [IU] | PEN_INJECTOR | Freq: Every day | SUBCUTANEOUS | 0 refills | Status: DC

## 2022-03-01 MED ORDER — CONTINUOUS GLUCOSE MONITOR SUP KIT: 1.0000 | PACK | 3 refills | Status: DC

## 2022-03-01 NOTE — Assessment & Plan Note (Signed)
Not under good control with A1c of 11.4 up from last check of 9.6. AM sugars running below 150 most of the time. Needs meal time insulin. Will get her hooked up with continuous glucose meter and education. Once that's set we'll get her set up with meal time insulin. Follow up 2-3 weeks.  ?

## 2022-03-01 NOTE — Telephone Encounter (Signed)
Copied from [place] [phone]. Topic: General - Other ?>> Mar 01, 2022  4:12 PM Tessa Lerner A wrote: ?Reason for CRM: Medication Refill - Medication: iron polysaccharides (NIFEREX) 150 MG capsule [[id number]] - patient has 1 capsule remaining  ? ?Has the patient contacted their pharmacy? Yes. The patient was directed to contact their PCP. ?(Agent: If no, request that the patient contact the pharmacy for the refill. If patient does not wish to contact the pharmacy document the reason why and proceed with request.) ?(Agent: If yes, when and what did the pharmacy advise?) ? ?Preferred Pharmacy (with phone number or street name): [facility] [place], [address] ?[place] [address] ?Phone: [phone] Fax: [phone] ?Hours: Not open 24 hours ? ?Has the patient been seen for an appointment in the last year OR does the patient have an upcoming appointment? Yes.   ? ?Agent: Please be advised that RX refills may take up to 3 business days. We ask that you follow-up with your pharmacy. ?

## 2022-03-01 NOTE — Progress Notes (Signed)
? ?BP 99/64   Pulse 94   Temp 98.1 ?F (36.7 ?C)   Ht 5' 1.5" (1.562 m)   Wt 171 lb 6.4 oz (77.7 kg)   SpO2 96%   BMI 31.86 kg/m?   ? ?Subjective:  ? ? Patient ID: Karen Farley, female    DOB: Sep 08, 1946, 76 y.o.   MRN: [id number] ? ?HPI: ?Karen Farley is a 76 y.o. female ? ?Chief Complaint  ?Patient presents with  ? Diabetes  ? ?DIABETES ?Hypoglycemic episodes:no ?Polydipsia/polyuria: no ?Visual disturbance: no ?Chest pain: no ?Paresthesias: no ?Glucose Monitoring: yes ? Accucheck frequency: Daily ? Fasting glucose: 160, 111, 130, 142, 190 ?Taking Insulin?: yes ? Long acting insulin: 48 units ?Blood Pressure Monitoring: a few times a week ?Retinal Examination: Up to Date ?Foot Exam: Not up to Date ?Diabetic Education: Completed ?Pneumovax: Up to Date ?Influenza: Up to Date ?Aspirin: no ? ?Relevant past medical, surgical, family and social history reviewed and updated as indicated. Interim medical history since our last visit reviewed. ?Allergies and medications reviewed and updated. ? ?Review of Systems  ?Constitutional: Negative.   ?Respiratory: Negative.    ?Cardiovascular: Negative.   ?Gastrointestinal: Negative.   ?Musculoskeletal: Negative.   ?Neurological: Negative.   ?Psychiatric/Behavioral: Negative.    ? ?Per HPI unless specifically indicated above ? ?   ?Objective:  ?  ?BP 99/64   Pulse 94   Temp 98.1 ?F (36.7 ?C)   Ht 5' 1.5" (1.562 m)   Wt 171 lb 6.4 oz (77.7 kg)   SpO2 96%   BMI 31.86 kg/m?   ?Wt Readings from Last 3 Encounters:  ?03/01/22 171 lb 6.4 oz (77.7 kg)  ?02/22/22 168 lb 4 oz (76.3 kg)  ?01/25/22 171 lb 3.2 oz (77.7 kg)  ?  ?Physical Exam ?Vitals and nursing note reviewed.  ?Constitutional:   ?   General: She is not in acute distress. ?   Appearance: Normal appearance. She is obese. She is not ill-appearing, toxic-appearing or diaphoretic.  ?HENT:  ?   Head: Normocephalic and atraumatic.  ?   Right Ear: External ear normal.  ?   Left Ear: External ear normal.  ?   Nose:  Nose normal.  ?   Mouth/Throat:  ?   Mouth: Mucous membranes are moist.  ?   Pharynx: Oropharynx is clear.  ?Eyes:  ?   General: No scleral icterus.    ?   Right eye: No discharge.     ?   Left eye: No discharge.  ?   Extraocular Movements: Extraocular movements intact.  ?   Conjunctiva/sclera: Conjunctivae normal.  ?   Pupils: Pupils are equal, round, and reactive to light.  ?Cardiovascular:  ?   Rate and Rhythm: Normal rate and regular rhythm.  ?   Pulses: Normal pulses.  ?   Heart sounds: Normal heart sounds. No murmur heard. ?  No friction rub. No gallop.  ?Pulmonary:  ?   Effort: Pulmonary effort is normal. No respiratory distress.  ?   Breath sounds: Normal breath sounds. No stridor. No wheezing, rhonchi or rales.  ?Chest:  ?   Chest wall: No tenderness.  ?Musculoskeletal:     ?   General: Normal range of motion.  ?   Cervical back: Normal range of motion and neck supple.  ?Skin: ?   General: Skin is warm and dry.  ?   Capillary Refill: Capillary refill takes less than 2 seconds.  ?   Coloration: Skin is not jaundiced  or pale.  ?   Findings: No bruising, erythema, lesion or rash.  ?Neurological:  ?   General: No focal deficit present.  ?   Mental Status: She is alert and oriented to person, place, and time. Mental status is at baseline.  ?Psychiatric:     ?   Mood and Affect: Mood normal.     ?   Behavior: Behavior normal.     ?   Thought Content: Thought content normal.     ?   Judgment: Judgment normal.  ? ? ?Results for orders placed or performed in visit on 03/01/22  ?Bayer DCA Hb A1c Waived  ?Result Value Ref Range  ? HB A1C (BAYER DCA - WAIVED) 11.4 (H) 4.8 - 5.6 %  ? ?   ?Assessment & Plan:  ? ?Problem List Items Addressed This Visit   ? ?  ? Endocrine  ? Type 2 diabetes mellitus ([place]) - Primary  ?  Not under good control with A1c of 11.4 up from last check of 9.6. AM sugars running below 150 most of the time. Needs meal time insulin. Will get her hooked up with continuous glucose meter and education.  Once that's set we'll get her set up with meal time insulin. Follow up 2-3 weeks.  ? ?  ?  ? Relevant Medications  ? insulin glargine, 2 Unit Dial, (TOUJEO MAX SOLOSTAR) 300 UNIT/ML Solostar Pen  ? Other Relevant Orders  ? Bayer DCA Hb A1c Waived (Completed)  ? Basic metabolic panel  ? AMB Referral to [facility]  ?  ? ?Follow up plan: ?Return 2-3 weeks follow virtual on sugars. ? ? ? ? ? ?

## 2022-03-02 LAB — BASIC METABOLIC PANEL
BUN/Creatinine Ratio: 17 (ref 12–28)
BUN: 36 mg/dL — ABNORMAL HIGH (ref 8–27)
CO2: 28 mmol/L (ref 20–29)
Calcium: 10 mg/dL (ref 8.7–10.3)
Chloride: 91 mmol/L — ABNORMAL LOW (ref 96–106)
Creatinine, Ser: 2.16 mg/dL — ABNORMAL HIGH (ref 0.57–1.00)
Glucose: 326 mg/dL — ABNORMAL HIGH (ref 70–99)
Potassium: 3.4 mmol/L — ABNORMAL LOW (ref 3.5–5.2)
Sodium: 137 mmol/L (ref 134–144)
eGFR: 23 mL/min/{1.73_m2} — ABNORMAL LOW (ref >59)

## 2022-03-04 ENCOUNTER — Telehealth: Payer: Self-pay

## 2022-03-04 ENCOUNTER — Ambulatory Visit (INDEPENDENT_AMBULATORY_CARE_PROVIDER_SITE_OTHER): Payer: Medicare PPO

## 2022-03-04 DIAGNOSIS — N184 Chronic kidney disease, stage 4 (severe): Secondary | ICD-10-CM

## 2022-03-04 DIAGNOSIS — Z794 Long term (current) use of insulin: Secondary | ICD-10-CM

## 2022-03-04 DIAGNOSIS — I5033 Acute on chronic diastolic (congestive) heart failure: Secondary | ICD-10-CM

## 2022-03-04 NOTE — Chronic Care Management (AMB) (Signed)
?Chronic Care Management  ? ?CCM RN Visit Note ? ?03/04/2022 ?Name: Karen Farley MRN: [id number] DOB: [birth year] ? ?Subjective: ?Karen Farley is a 76 y.o. year old female who is a primary care patient of Valerie Roys, DO. The care management team was consulted for assistance with disease management and care coordination needs.   ? ?Engaged with patient by telephone for initial visit in response to provider referral for case management and/or care coordination services.  ? ?Consent to Services:  ?The patient was given the following information about Chronic Care Management services today, agreed to services, and gave verbal consent: 1. CCM service includes personalized support from designated clinical staff supervised by the primary care provider, including individualized plan of care and coordination with other care providers 2. 24/7 contact phone numbers for assistance for urgent and routine care needs. 3. Service will only be billed when office clinical staff spend 20 minutes or more in a month to coordinate care. 4. Only one practitioner may furnish and bill the service in a calendar month. 5.The patient may stop CCM services at any time (effective at the end of the month) by phone call to the office staff. 6. The patient will be responsible for cost sharing (co-pay) of up to 20% of the service fee (after annual deductible is met). Patient agreed to services and consent obtained. ? ?Patient agreed to services and verbal consent obtained.  ? ?Assessment: Review of patient past medical history, allergies, medications, health status, including review of consultants reports, laboratory and other test data, was performed as part of comprehensive evaluation and provision of chronic care management services.  ? ?SDOH (Social Determinants of Health) assessments and interventions performed:   ? ?CCM Care Plan ? ?No Known Allergies ? ?Outpatient Encounter Medications as of 03/04/2022  ?Medication Sig Note  ?  ACCU-CHEK GUIDE test strip USE TO CHECK BLOOD SUGAR DAILY   ? apixaban (ELIQUIS) 5 MG TABS tablet Take 1 tablet (5 mg total) by mouth 2 (two) times daily.   ? Cholecalciferol 125 MCG (5000 UT) TABS Take by mouth. 12/17/2021: Once a week   ? Continuous Glucose Monitor Sup KIT 1 each by Does not apply route continuous.   ? Elastic Bandages & Supports (MEDICAL COMPRESSION STOCKINGS) MISC by Does not apply route.   ? empagliflozin (JARDIANCE) 25 MG TABS tablet Take 1 tablet (25 mg total) by mouth daily before breakfast.   ? folic acid (FOLVITE) 1 MG tablet Take 1 tablet (1 mg total) by mouth daily.   ? insulin glargine, 2 Unit Dial, (TOUJEO MAX SOLOSTAR) 300 UNIT/ML Solostar Pen Inject 48 Units into the skin at bedtime.   ? Insulin Pen Needle (PEN NEEDLES) 30G X 8 MM MISC 1 each by Does not apply route daily.   ? iron polysaccharides (NIFEREX) 150 MG capsule TAKE 1 CAPSULE(150 MG) BY MOUTH DAILY   ? latanoprost (XALATAN) 0.005 % ophthalmic solution Place 1 drop into both eyes at bedtime.   ? metolazone (ZAROXOLYN) 2.5 MG tablet Take 1 tablet by mouth every Monday, Wednesday, and Friday. 12/17/2021: TID   ? metoprolol succinate (TOPROL-XL) 25 MG 24 hr tablet Take 0.5 tablets (12.5 mg total) by mouth daily.   ? midodrine (PROAMATINE) 10 MG tablet Take 1 tablet (10 mg total) by mouth 3 (three) times daily with meals.   ? Multiple Vitamin (MULTIVITAMIN WITH MINERALS) TABS tablet Take 1 tablet by mouth daily.   ? Niacin (VITAMIN B-3 PO) Take 5,000 Units by mouth once  a week.   ? pantoprazole (PROTONIX) 40 MG tablet Take 1 tablet (40 mg total) by mouth 2 (two) times daily.   ? polyethylene glycol (MIRALAX / GLYCOLAX) 17 g packet Take 17 g by mouth daily.   ? potassium chloride SA (KLOR-CON) 20 MEQ tablet Take 2 tablets (40 mEq total) by mouth daily.   ? spironolactone (ALDACTONE) 25 MG tablet Take 1 tablet (25 mg total) by mouth daily.   ? torsemide (DEMADEX) 20 MG tablet TAKE 1 TO 2 TABLETS BY MOUTH DAILY. ONLY TAKE 2 TABLETS  FOR 1 TO 3 DAYS AND IF PERSISTS, CALL WITH BAD SWELLING   ? vitamin B-12 (CYANOCOBALAMIN) 500 MCG tablet Take 1 tablet (500 mcg total) by mouth daily.   ? ?No facility-administered encounter medications on file as of 03/04/2022.  ? ? ?Patient Active Problem List  ? Diagnosis Date Noted  ? Pulmonary hypertension, primary ([place]) 09/07/2021  ? Acute respiratory failure with hypoxia ([place])   ? Shock circulatory ([place]) 08/05/2021  ? Pericardial effusion 08/05/2021  ? Acute blood loss anemia 08/04/2021  ? Anasarca   ? Stage 3b chronic kidney disease ([place]) 05/17/2021  ? Pleural effusion 05/17/2021  ? Acute on chronic diastolic CHF (congestive heart failure) ([place]) 05/17/2021  ? Obesity, Class III, BMI 40-49.9 (morbid obesity) ([place]) 05/17/2021  ? Chronic anticoagulation 05/17/2021  ? Acute kidney injury superimposed on CKD ([place]) 04/11/2021  ? Hyponatremia 04/11/2021  ? Acute on chronic heart failure with preserved ejection fraction (HFpEF) ([place]) 04/11/2021  ? Chronic venous insufficiency 08/14/2020  ? PAD (peripheral artery disease) ([place]) 08/14/2020  ? Bilateral primary osteoarthritis of knee 04/13/2020  ? Type 2 diabetes mellitus ([place]) 04/13/2020  ? Acquired spondylolisthesis 12/17/2019  ? Osteoarthritis of knee 12/17/2019  ? Lumbar radiculopathy 12/17/2019  ? Atrial fibrillation ([place]) 11/20/2019  ? Closed fracture of radial styloid 08/06/2019  ? Displaced fracture of unspecified radial styloid process, initial encounter for closed fracture 08/06/2019  ? Controlled substance agreement signed 01/24/2018  ? Inflammatory spondylopathy of lumbar region [facility] 01/24/2018  ? Piriformis syndrome 07/01/2016  ? Sacroiliac joint dysfunction 01/09/2016  ? Joint disorder, unspecified 01/09/2016  ? 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 diabetes mellitus ([place]) 04/28/2015  ? Lymphedema 03/27/2015  ? Edema 03/27/2015  ? Open-angle glaucoma, mild stage   ? Osteoarthritis of both knees   ? Type 2 diabetes mellitus with renal complication (HCC)   ? Osteopenia    ? Benign hypertensive renal disease   ? Hyperlipidemia   ? Degeneration of lumbosacral intervertebral disc   ? ? ?Conditions to be addressed/monitored:CHF, DMII, and CKD Stage 4 ? ?Care Plan : RNCM: General Plan of Care (Adult) for Chronic Disease Management and Care Coordination Needs  ?Updates made by Vanita Ingles, RN since 03/04/2022 12:00 AM  ?  ? ?Problem: RNCM: Development of plan of care for Chronic Disease Management (HF, DM, CKD4)   ?Priority: High  ?  ? 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 ?RNCM Clinical Goal(s):  ?  Patient will verbalize understanding of plan for management of CHF, DMII, and CKD Stage 4 as evidenced by keeping appointments with the provider, following the plan of care, taking medications, following dietary restrictions, and working with the CCM team to effectively manage health and well being.  ?demonstrate understanding of rationale for each prescribed medication as evidenced by taking medications as prescribed and calling for refills before running out of medications.     ?attend all scheduled medical appointments: with pcp and specialist as evidenced by keeping appointments and calling for schedule change needs         ?demonstrate improved and ongoing adherence to prescribed treatment plan for CHF, DMII, and CKD Stage 4 as evidenced by lab work stable with trending down A1C level, stable VS, no exacerbations of chronic conditions.  ?demonstrate a decrease in CHF, DMII, and CKD Stage 4 exacerbations   as evidenced by no acute onset of chronic conditions, no unplanned hospital admits, and working with the CCM team to effectively manage health and well being.  ?demonstrate ongoing self health care man

## 2022-03-04 NOTE — Patient Instructions (Signed)
Visit Information  ? ?Thank you for taking time to visit with me today. Please don't hesitate to contact me if I can be of assistance to you before our next scheduled telephone appointment. ? ?Following are the goals we discussed today:  ?(Copy and paste patient goals from clinical care plan here) ? ?Our next appointment is by telephone on 03-05-2022  ? ?Please call the care guide team at [phone] if you need to cancel or reschedule your appointment.  ? ?If you are experiencing a Mental Health or [place] or need someone to talk to, please call the Suicide and Crisis Lifeline: 988 ?call the [place] National Suicide Prevention Lifeline: (641)707-8213 or TTY: 403 383 8325 TTY 501-477-4889) to talk to a trained counselor ?call 1-800-273-TALK (toll free, 24 hour hotline)  ? ?Following is a copy of your full care plan:  ?Care Plan : RNCM: General Plan of Care (Adult) for Chronic Disease Management and Care Coordination Needs  ?Updates made by Vanita Ingles, RN since 03/04/2022 12:00 AM  ?  ? ?Problem: RNCM: Development of plan of care for Chronic Disease Management (HF, DM, CKD4)   ?Priority: High  ?  ? 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 ?RNCM Clinical Goal(s):  ?Patient will verbalize understanding of plan for management of CHF, DMII, and CKD Stage 4 as evidenced by keeping appointments with the provider, following the plan of care, taking medications, following dietary restrictions, and working with the CCM team to  effectively manage health and well being.  ?demonstrate understanding of rationale for each prescribed medication as evidenced by taking medications as prescribed and calling for refills before running out of medications.     ?attend all scheduled medical appointments: with pcp and specialist as evidenced by keeping appointments and calling for schedule change needs         ?demonstrate improved and ongoing adherence to prescribed treatment plan for CHF, DMII, and CKD Stage 4 as evidenced by lab work stable with trending down A1C level, stable VS, no exacerbations of chronic conditions.  ?demonstrate a decrease in CHF, DMII, and CKD Stage 4 exacerbations  as evidenced by no acute onset of chronic conditions, no unplanned hospital admits, and working with the CCM team to effectively manage health and well being.  ?demonstrate ongoing self health care management ability for effective management of chronic conditions  as evidenced by working with the CCM team through collaboration with Consulting civil engineer, provider, and care team.  ? ?Interventions: ?1:1 collaboration with primary care provider regarding development and update of comprehensive plan of care as evidenced by provider attestation and co-signature ?Inter-disciplinary care team collaboration (see longitudinal plan of care) ?Evaluation of current treatment plan related to  self management and patient's adherence to plan as established by provider ? ? ?Heart Failure Interventions:  (Status: New goal.)  Long Term Goal  ?Wt Readings from Last 3 Encounters:  ?03/01/22 171 lb 6.4 oz (77.7 kg)  ?02/22/22 168 lb 4 oz (76.3 kg)  ?01/25/22 171 lb  3.2 oz (77.7 kg)  ? ?Basic overview and discussion of pathophysiology of Heart Failure reviewed ?Provided education on low sodium diet ?Reviewed Heart Failure Action Plan in depth and provided written copy ?Assessed need for readable accurate scales in home ?Provided education about placing scale on hard, flat surface ?Advised  patient to weigh each morning after emptying bladder ?Discussed importance of daily weight and advised patient to weigh and record daily ?Reviewed role of diuretics in prevention of fluid overload and management of heart failure ?Discussed the importance of keeping all appointments with provider ?Provided patient with education about the role of exercise in the management of heart failure ?Advised patient to discuss changes in heart health and CHF with provider ?Screening for signs and symptoms of depression related to chronic disease state  ?Assessed social determinant of health barriers ? ?Chronic Kidney Disease (Status: New goal.)  Long Term Goal  ?Last practice recorded BP readings:  ?BP Readings from Last 3 Encounters:  ?03/01/22 99/64  ?02/22/22 100/60  ?01/25/22 99/66  ?Most recent eGFR/CrCl:  ?Lab Results  ?Component Value Date  ? EGFR 23 (L) 03/01/2022  ?  No components found for: CRCL ? ?Evaluation of current treatment plan related to chronic kidney disease self management and patient's adherence to plan as established by provider      ?Provided education to patient re: stroke prevention, s/s of heart attack and stroke    ?Reviewed prescribed diet heart healthy/ADA diet  ?Reviewed medications with patient and discussed importance of compliance    ?Advised patient, providing education and rationale, to monitor blood pressure daily and record, calling PCP for findings outside established parameters    ?Discussed complications of poorly controlled blood pressure such as heart disease, stroke, circulatory complications, vision complications, kidney impairment, sexual dysfunction    ?Advised patient to discuss changes in kidney function or urinary patterns  with provider    ?Discussed plans with patient for ongoing care management follow up and provided patient with direct contact information for care management team    ?Screening for signs and symptoms of depression related to chronic disease state       ?Discussed the impact of chronic kidney disease on daily life and mental health and acknowledged and normalized feelings of disempowerment, fear, and frustration    ?Assessed social determinant of health barriers    ?Provided education on kidney disease progression    ?Engage patient in early, proactive and ongoing discussion about goals of care and what matters most to them    ?Support coping and stress management by recognizing current strategies and assist in developing new strategies such as mindfulness, journaling, relaxation techniques, problem-solving    ? ? ?Diabetes:  (Status: New goal.) Long Term Goal  ? ?Lab Results  ?Component Value Date  ? HGBA1C 11.4 (H) 03/01/2022  ?  ?Assessed patient's understanding of A1c goal: <7% ?Provided education to patient about basic DM disease process; ?Reviewed medications with patient and discussed importance of medication adherence. 03-04-2022: The pcp wants to add meal time insulin coverage but also would like the patient to have a continuous glucose reader. This was ordered on 03-01-2022. Call made to [facility] in [place] on 03-04-2022 to follow up on the glucose reader as the patient told the scheduler they gave her a regular meter. The pcp notified by secure chat messaging and gave the [facility] permission to give a verbal order for continuous glucose meter/ freestyle [place] or whatever the insurance would cover. The RNCM spoke to [place] at  [facility] and he stated that someone overlooked the continuous glucose meter order and gave her a regular meter. The pcp gave the [facility] permission to give a verbal order for the [facility]. Marya Amsler received the verbal order and will process the order for the [employer]. He states the order should be completed today unless a PA is needed. The RNCM has followed up with the patient. She has not heard back from [facility]. Will touch base with the patient in the morning to see if she has obtained the [facility] and will set up  a time to help her get started with monitoring. Will collaborate with the pcp and let the pcp know the current status and updates. ;        ?Reviewed prescribed diet with patient heart healthy/ADA; ?Counseled

## 2022-03-04 NOTE — Chronic Care Management (AMB) (Signed)
?  Care Management  ? ?Outreach Note ? ?03/04/2022 ?Name: RENNEE COYNE MRN: [id number] DOB: 1946-05-06 ? ?Referred by: Valerie Roys, DO ?Reason for referral : Care Coordination (Outreach to schedule with RNCM ) ? ? ?An unsuccessful telephone outreach was attempted today. The patient was referred to the case management team for assistance with care management and care coordination.  ? ?Follow Up Plan:  ?A HIPAA compliant phone message was left for the patient providing contact information and requesting a return call.  ?The care management team will reach out to the patient again over the next 1 days.  ?If patient returns call to provider office, please advise to call [place] * at [phone]* ? ?Karen Farley, RMA ?Care Guide, Embedded Care Coordination ?[facility] [place]  Care Management  ?[place],  [address] ?Direct Dial: [phone] ?[email or web address] ?Website: [email or web address]  ? ?

## 2022-03-04 NOTE — Telephone Encounter (Signed)
Requested medication (s) are due for refill today: no ? ?Requested medication (s) are on the active medication list: yes ? ?Last refill:  02/14/22 #90 1 RF ? ?Future visit scheduled: yes ? ?Notes to clinic:  med not assigned to a protocol- NT not delegated to refuse this med ? ? ?Requested Prescriptions  ?Pending Prescriptions Disp Refills  ? iron polysaccharides (NIFEREX) 150 MG capsule 90 capsule 1  ?  Sig: TAKE 1 CAPSULE(150 MG) BY MOUTH DAILY  ?  ? Off-Protocol Failed - 03/04/2022  8:02 AM  ?  ?  Failed - Medication not assigned to a protocol, review manually.  ?  ?  Passed - Valid encounter within last 12 months  ?  Recent Outpatient Visits   ? ?      ? 3 days ago Type 2 diabetes mellitus with diabetic nephropathy, with long-term current use of insulin ([place])  ? [place], [place] P, DO  ? 2 weeks ago Type 2 diabetes mellitus with diabetic nephropathy, with long-term current use of insulin ([place])  ? [place], [name], DO  ? 1 month ago Type 2 diabetes mellitus with diabetic nephropathy, with long-term current use of insulin ([place])  ? [place], [name], DO  ? 1 month ago Type 2 diabetes mellitus with diabetic nephropathy, with long-term current use of insulin ([place])  ? [place], [name], DO  ? 1 month ago Dizziness  ? [place], [place] P, DO  ? ?  ?  ?Future Appointments   ? ?        ? In 5 months Gollan, Kathlene November, MD [facility]  ? ?  ? ? ?  ?  ?  ? ? ? ? ?

## 2022-03-04 NOTE — Telephone Encounter (Signed)
rx was sent to pharmacy on 02/14/22 #90/1 ?E-Prescribing Status: Receipt confirmed by pharmacy (02/14/2022 ?4:27 PM EDT) ?Requested Prescriptions  ?Refused Prescriptions Disp Refills  ?? iron polysaccharides (FERREX 150) 150 MG capsule [Pharmacy Med Name: FERREX '150MG'$  CAPSULES] 30 capsule   ?  Sig: TAKE 1 CAPSULE(150 MG) BY MOUTH DAILY  ?  ? Off-Protocol Failed - 03/01/2022  4:55 PM  ?  ?  Failed - Medication not assigned to a protocol, review manually.  ?  ?  Passed - Valid encounter within last 12 months  ?  Recent Outpatient Visits   ?      ? 3 days ago Type 2 diabetes mellitus with diabetic nephropathy, with long-term current use of insulin ([place])  ? [place], [place] P, DO  ? 2 weeks ago Type 2 diabetes mellitus with diabetic nephropathy, with long-term current use of insulin ([place])  ? [place], [name], DO  ? 1 month ago Type 2 diabetes mellitus with diabetic nephropathy, with long-term current use of insulin ([place])  ? [place], [name], DO  ? 1 month ago Type 2 diabetes mellitus with diabetic nephropathy, with long-term current use of insulin ([place])  ? [place], [name], DO  ? 1 month ago Dizziness  ? [place], [place] P, DO  ?  ?  ?Future Appointments   ?        ? In 5 months Gollan, Kathlene November, MD [facility]  ?  ? ?  ?  ?  ? ? ?

## 2022-03-05 ENCOUNTER — Telehealth: Payer: Self-pay

## 2022-03-05 NOTE — Telephone Encounter (Signed)
?  Care Management  ? ?Follow Up Note ? ? ?03/05/2022 ?Name: Karen Farley MRN: [id number] DOB: 1945/12/04 ? ? ?Referred by: Valerie Roys, DO ?Reason for referral : Chronic Care Management (RNCM: Follow up on the patients status with receiving the [facility] for continuous glucose monitoring) ? ? ?Checked in with the patient to see if she had the [employer] yet. She states she did call them late yesterday and they said it was not ready. Will reach back out to the patient tomorrow for follow up and arrange a time to work with the patient on set up. ? ?Follow Up Plan: Telephone follow up appointment with care management team member scheduled for: 03-06-2022 ? ?Noreene Larsson RN, MSN, CCM ?Community Care Coordinator ?[facility] ?[place] ?Mobile: [phone]  ?

## 2022-03-06 ENCOUNTER — Ambulatory Visit: Payer: Self-pay

## 2022-03-06 DIAGNOSIS — I5033 Acute on chronic diastolic (congestive) heart failure: Secondary | ICD-10-CM

## 2022-03-06 DIAGNOSIS — Z794 Long term (current) use of insulin: Secondary | ICD-10-CM

## 2022-03-06 NOTE — Patient Instructions (Signed)
Visit Information ? ?Thank you for taking time to visit with me today. Please don't hesitate to contact me if I can be of assistance to you before our next scheduled telephone appointment. ? ?Following are the goals we discussed today:  ?Heart Failure Interventions:  (Status: New goal.)  Long Term Goal  ?   ?Wt Readings from Last 3 Encounters:  ?03/01/22 171 lb 6.4 oz (77.7 kg)  ?02/22/22 168 lb 4 oz (76.3 kg)  ?01/25/22 171 lb 3.2 oz (77.7 kg)  ?  ?Basic overview and discussion of pathophysiology of Heart Failure reviewed ?Provided education on low sodium diet ?Reviewed Heart Failure Action Plan in depth and provided written copy ?Assessed need for readable accurate scales in home ?Provided education about placing scale on hard, flat surface ?Advised patient to weigh each morning after emptying bladder ?Discussed importance of daily weight and advised patient to weigh and record daily ?Reviewed role of diuretics in prevention of fluid overload and management of heart failure ?Discussed the importance of keeping all appointments with provider ?Provided patient with education about the role of exercise in the management of heart failure ?Advised patient to discuss changes in heart health and CHF with provider ?Screening for signs and symptoms of depression related to chronic disease state  ?Assessed social determinant of health barriers ?  ?Chronic Kidney Disease (Status: New goal.)  Long Term Goal  ?Last practice recorded BP readings:  ?   ?BP Readings from Last 3 Encounters:  ?03/01/22 99/64  ?02/22/22 100/60  ?01/25/22 99/66  ?Most recent eGFR/CrCl:  ?     ?Lab Results  ?Component Value Date  ?  EGFR 23 (L) 03/01/2022  ?  No components found for: CRCL ?  ?Evaluation of current treatment plan related to chronic kidney disease self management and patient's adherence to plan as established by provider      ?Provided education to patient re: stroke prevention, s/s of heart attack and stroke    ?Reviewed prescribed  diet heart healthy/ADA diet  ?Reviewed medications with patient and discussed importance of compliance    ?Advised patient, providing education and rationale, to monitor blood pressure daily and record, calling PCP for findings outside established parameters    ?Discussed complications of poorly controlled blood pressure such as heart disease, stroke, circulatory complications, vision complications, kidney impairment, sexual dysfunction    ?Advised patient to discuss changes in kidney function or urinary patterns  with provider    ?Discussed plans with patient for ongoing care management follow up and provided patient with direct contact information for care management team    ?Screening for signs and symptoms of depression related to chronic disease state      ?Discussed the impact of chronic kidney disease on daily life and mental health and acknowledged and normalized feelings of disempowerment, fear, and frustration    ?Assessed social determinant of health barriers    ?Provided education on kidney disease progression    ?Engage patient in early, proactive and ongoing discussion about goals of care and what matters most to them    ?Support coping and stress management by recognizing current strategies and assist in developing new strategies such as mindfulness, journaling, relaxation techniques, problem-solving    ?  ?  ?Diabetes:  (Status: New goal.) Long Term Goal  ?  ?     ?Lab Results  ?Component Value Date  ?  HGBA1C 11.4 (H) 03/01/2022  ?  ?Assessed patient's understanding of A1c goal: <7% ?Provided education to patient about basic  DM disease process; ?Reviewed medications with patient and discussed importance of medication adherence. 03-04-2022: The pcp wants to add meal time insulin coverage but also would like the patient to have a continuous glucose reader. This was ordered on 03-01-2022. Call made to [facility] in [place] on 03-04-2022 to follow up on the glucose reader as the patient told the  scheduler they gave her a regular meter. The pcp notified by secure chat messaging and gave the [facility] permission to give a verbal order for continuous glucose meter/ freestyle [place] or whatever the insurance would cover. The RNCM spoke to [place] at [facility] and he stated that someone overlooked the continuous glucose meter order and gave her a regular meter. The pcp gave the [facility] permission to give a verbal order for the [facility]. Marya Amsler received the verbal order and will process the order for the [employer]. He states the order should be completed today unless a PA is needed. The RNCM has followed up with the patient. She has not heard back from [facility]. Will touch base with the patient in the morning to see if she has obtained the [facility] and will set up a time to help her get started with monitoring. Will collaborate with the pcp and let the pcp know the current status and updates. 03-06-2022: The patient states that she called [facility] last night and they told her they were still working on her [employer]. She does not know when it will be available. The patient states she will call the [facility] when it is available. The RNCM advised that a call would be made on Friday to touch base with the patient. Will continue to monitor for changes and set up a time when the [facility] can assist with showing the patient how to work the meter for continuous blood glucose monitoring. ;        ?Reviewed prescribed diet with patient heart healthy/ADA; ?Counseled on importance of regular laboratory monitoring as prescribed;        ?Discussed plans with patient for ongoing care management follow up and provided patient with direct contact information for care management team;      ?Provided patient with written educational materials related to hypo and hyperglycemia and importance of correct treatment;       ?Reviewed scheduled/upcoming provider appointments including: 03-21-2022 at 340 pm;         ?Advised patient,  providing education and rationale, to check cbg when you have symptoms of low or high blood sugar, before and after exercise, and continuous reader [facility] Elenor Legato ordered on 03-01-2022- RNCM called and spoke to the pharmacist on 5-15-2023Marya Amsler with [facility] for assistance as they did not give the patient the correct meter on 03-01-2022  and record. 03-06-2022: The patient states her blood sugar this am was 197. Denies any acute findings.       ?call provider for findings outside established parameters;       ?Review of patient status, including review of consultants reports, relevant laboratory and other test results, and medications completed;       ?Advised patient to discuss blood sugar trends  with provider;   ? ?Our next appointment is by telephone on 03-08-2022 ? ?Please call the care guide team at [phone] if you need to cancel or reschedule your appointment.  ? ?If you are experiencing a Mental Health or [place] or need someone to talk to, please call the Suicide and Crisis Lifeline: 988 ?call the [place] National  Suicide Prevention Lifeline: 702-647-2606 or TTY: (820)198-1652 TTY (580) 749-0036) to talk to a trained counselor ?call 1-800-273-TALK (toll free, 24 hour hotline)  ? ?The patient verbalized understanding of instructions, educational materials, and care plan provided today and DECLINED offer to receive copy of patient instructions, educational materials, and care plan.  ? ?Noreene Larsson RN, MSN, CCM ?Community Care Coordinator ?[facility] ?[place] ?Mobile: [phone]  ?

## 2022-03-06 NOTE — Chronic Care Management (AMB) (Signed)
Chronic Care Management   CCM RN Visit Note  03/06/2022 Name: Karen Farley MRN: [id number] DOB: 1946/08/20  Subjective: Karen Farley is a 76 y.o. year old female who is a primary care patient of Valerie Roys, DO. The care management team was consulted for assistance with disease management and care coordination needs.    Engaged with patient by telephone for follow up visit in response to provider referral for case management and/or care coordination services.   Consent to Services:  The patient was given information about Chronic Care Management services, agreed to services, and gave verbal consent prior to initiation of services.  Please see initial visit note for detailed documentation.   Patient agreed to services and verbal consent obtained.   Assessment: Review of patient past medical history, allergies, medications, health status, including review of consultants reports, laboratory and other test data, was performed as part of comprehensive evaluation and provision of chronic care management services.   SDOH (Social Determinants of Health) assessments and interventions performed:    CCM Care Plan  No Known Allergies  Outpatient Encounter Medications as of 03/06/2022  Medication Sig Note   ACCU-CHEK GUIDE test strip USE TO CHECK BLOOD SUGAR DAILY    apixaban (ELIQUIS) 5 MG TABS tablet Take 1 tablet (5 mg total) by mouth 2 (two) times daily.    Cholecalciferol 125 MCG (5000 UT) TABS Take by mouth. 12/17/2021: Once a week    Continuous Glucose Monitor Sup KIT 1 each by Does not apply route continuous.    Elastic Bandages & Supports (MEDICAL COMPRESSION STOCKINGS) MISC by Does not apply route.    empagliflozin (JARDIANCE) 25 MG TABS tablet Take 1 tablet (25 mg total) by mouth daily before breakfast.    folic acid (FOLVITE) 1 MG tablet Take 1 tablet (1 mg total) by mouth daily.    insulin glargine, 2 Unit Dial, (TOUJEO MAX SOLOSTAR) 300 UNIT/ML Solostar Pen Inject 48  Units into the skin at bedtime.    Insulin Pen Needle (PEN NEEDLES) 30G X 8 MM MISC 1 each by Does not apply route daily.    iron polysaccharides (NIFEREX) 150 MG capsule TAKE 1 CAPSULE(150 MG) BY MOUTH DAILY    latanoprost (XALATAN) 0.005 % ophthalmic solution Place 1 drop into both eyes at bedtime.    metolazone (ZAROXOLYN) 2.5 MG tablet Take 1 tablet by mouth every Monday, Wednesday, and Friday. 12/17/2021: TID    metoprolol succinate (TOPROL-XL) 25 MG 24 hr tablet Take 0.5 tablets (12.5 mg total) by mouth daily.    midodrine (PROAMATINE) 10 MG tablet Take 1 tablet (10 mg total) by mouth 3 (three) times daily with meals.    Multiple Vitamin (MULTIVITAMIN WITH MINERALS) TABS tablet Take 1 tablet by mouth daily.    Niacin (VITAMIN B-3 PO) Take 5,000 Units by mouth once a week.    pantoprazole (PROTONIX) 40 MG tablet Take 1 tablet (40 mg total) by mouth 2 (two) times daily.    polyethylene glycol (MIRALAX / GLYCOLAX) 17 g packet Take 17 g by mouth daily.    potassium chloride SA (KLOR-CON) 20 MEQ tablet Take 2 tablets (40 mEq total) by mouth daily.    spironolactone (ALDACTONE) 25 MG tablet Take 1 tablet (25 mg total) by mouth daily.    torsemide (DEMADEX) 20 MG tablet TAKE 1 TO 2 TABLETS BY MOUTH DAILY. ONLY TAKE 2 TABLETS FOR 1 TO 3 DAYS AND IF PERSISTS, CALL WITH BAD SWELLING    vitamin B-12 (CYANOCOBALAMIN) 500 MCG  tablet Take 1 tablet (500 mcg total) by mouth daily.    No facility-administered encounter medications on file as of 03/06/2022.    Patient Active Problem List   Diagnosis Date Noted   Pulmonary hypertension, primary ([place]) 09/07/2021   Acute respiratory failure with hypoxia (HCC)    Shock circulatory ([place]) 08/05/2021   Pericardial effusion 08/05/2021   Acute blood loss anemia 08/04/2021   Anasarca    Stage 3b chronic kidney disease ([place]) 05/17/2021   Pleural effusion 05/17/2021   Acute on chronic diastolic CHF (congestive heart failure) ([place]) 05/17/2021   Obesity, Class  III, BMI 40-49.9 (morbid obesity) ([place]) 05/17/2021   Chronic anticoagulation 05/17/2021   Acute kidney injury superimposed on CKD ([place]) 04/11/2021   Hyponatremia 04/11/2021   Acute on chronic heart failure with preserved ejection fraction (HFpEF) ([place]) 04/11/2021   Chronic venous insufficiency 08/14/2020   PAD (peripheral artery disease) ([place]) 08/14/2020   Bilateral primary osteoarthritis of knee 04/13/2020   Type 2 diabetes mellitus ([place]) 04/13/2020   Acquired spondylolisthesis 12/17/2019   Osteoarthritis of knee 12/17/2019   Lumbar radiculopathy 12/17/2019   Atrial fibrillation ([place]) 11/20/2019   Closed fracture of radial styloid 08/06/2019   Displaced fracture of unspecified radial styloid process, initial encounter for closed fracture 08/06/2019   Controlled substance agreement signed 01/24/2018   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   Piriformis syndrome 07/01/2016   Sacroiliac joint dysfunction 01/09/2016   Joint disorder, unspecified 01/09/2016   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 diabetes mellitus ([place]) 04/28/2015   Lymphedema 03/27/2015   Edema 03/27/2015   Open-angle glaucoma, mild stage    Osteoarthritis of both knees    Type 2 diabetes mellitus with renal complication (HCC)    Osteopenia    Benign hypertensive renal disease    Hyperlipidemia    Degeneration of lumbosacral intervertebral disc     Conditions to be addressed/monitored:CHF and DMII  Care Plan : RNCM: General Plan of Care (Adult) for Chronic Disease Management and Care Coordination Needs  Updates made by Vanita Ingles, RN since 03/06/2022 12:00 AM     Problem: RNCM: Development of plan of care for Chronic Disease Management (HF, DM, CKD4)   Priority: High     Long-Range Goal: RNCM: Effective Management  of plan of care for Chronic Disease Management (HF, DM, CKD4)   Start Date: 03/04/2022  Expected End Date: 03/04/2022  Priority: High  Note:   Current Barriers:  Knowledge Deficits  related to plan of care for management of CHF, DMII, and CKD Stage 4  Care Coordination needs related to education needs on how to use the continuous glucose meter system and DM education and support  Chronic Disease Management support and education needs related to CHF, DMII, and CKD Stage 4  RNCM Clinical Goal(s):  Patient will verbalize understanding of plan for management of CHF, DMII, and CKD Stage 4 as evidenced by keeping appointments with the provider, following the plan of care, taking medications, following dietary restrictions, and working with the CCM team to effectively manage health and well being.  demonstrate understanding of rationale for each prescribed medication as evidenced by taking medications as prescribed and calling for refills before running out of medications.     attend all scheduled medical appointments: with pcp and specialist as evidenced by keeping appointments and calling for schedule change needs         demonstrate improved and ongoing adherence to prescribed treatment plan for CHF, DMII, and CKD Stage  4 as evidenced by lab work stable with trending down A1C level, stable VS, no exacerbations of chronic conditions.  demonstrate a decrease in CHF, DMII, and CKD Stage 4 exacerbations  as evidenced by no acute onset of chronic conditions, no unplanned hospital admits, and working with the CCM team to effectively manage health and well being.  demonstrate ongoing self health care management ability for effective management of chronic conditions  as evidenced by working with the CCM team through collaboration with Consulting civil engineer, provider, and care team.   Interventions: 1:1 collaboration with primary care provider regarding development and update of comprehensive plan of care as evidenced by provider attestation and co-signature Inter-disciplinary care team collaboration (see longitudinal plan of care) Evaluation of current treatment plan related to  self management  and patient's adherence to plan as established by provider   Heart Failure Interventions:  (Status: New goal.)  Long Term Goal  Wt Readings from Last 3 Encounters:  03/01/22 171 lb 6.4 oz (77.7 kg)  02/22/22 168 lb 4 oz (76.3 kg)  01/25/22 171 lb 3.2 oz (77.7 kg)   Basic overview and discussion of pathophysiology of Heart Failure reviewed Provided education on low sodium diet Reviewed Heart Failure Action Plan in depth and provided written copy Assessed need for readable accurate scales in home Provided education about placing scale on hard, flat surface Advised patient to weigh each morning after emptying bladder Discussed importance of daily weight and advised patient to weigh and record daily Reviewed role of diuretics in prevention of fluid overload and management of heart failure Discussed the importance of keeping all appointments with provider Provided patient with education about the role of exercise in the management of heart failure Advised patient to discuss changes in heart health and CHF with provider Screening for signs and symptoms of depression related to chronic disease state  Assessed social determinant of health barriers  Chronic Kidney Disease (Status: New goal.)  Long Term Goal  Last practice recorded BP readings:  BP Readings from Last 3 Encounters:  03/01/22 99/64  02/22/22 100/60  01/25/22 99/66  Most recent eGFR/CrCl:  Lab Results  Component Value Date   EGFR 23 (L) 03/01/2022    No components found for: CRCL  Evaluation of current treatment plan related to chronic kidney disease self management and patient's adherence to plan as established by provider      Provided education to patient re: stroke prevention, s/s of heart attack and stroke    Reviewed prescribed diet heart healthy/ADA diet  Reviewed medications with patient and discussed importance of compliance    Advised patient, providing education and rationale, to monitor blood pressure daily and  record, calling PCP for findings outside established parameters    Discussed complications of poorly controlled blood pressure such as heart disease, stroke, circulatory complications, vision complications, kidney impairment, sexual dysfunction    Advised patient to discuss changes in kidney function or urinary patterns  with provider    Discussed plans with patient for ongoing care management follow up and provided patient with direct contact information for care management team    Screening for signs and symptoms of depression related to chronic disease state      Discussed the impact of chronic kidney disease on daily life and mental health and acknowledged and normalized feelings of disempowerment, fear, and frustration    Assessed social determinant of health barriers    Provided education on kidney disease progression    Engage patient in early, proactive  and ongoing discussion about goals of care and what matters most to them    Support coping and stress management by recognizing current strategies and assist in developing new strategies such as mindfulness, journaling, relaxation techniques, problem-solving      Diabetes:  (Status: New goal.) Long Term Goal   Lab Results  Component Value Date   HGBA1C 11.4 (H) 03/01/2022    Assessed patient's understanding of A1c goal: <7% Provided education to patient about basic DM disease process; Reviewed medications with patient and discussed importance of medication adherence. 03-04-2022: The pcp wants to add meal time insulin coverage but also would like the patient to have a continuous glucose reader. This was ordered on 03-01-2022. Call made to [facility] in [place] on 03-04-2022 to follow up on the glucose reader as the patient told the scheduler they gave her a regular meter. The pcp notified by secure chat messaging and gave the [facility] permission to give a verbal order for continuous glucose meter/ freestyle [place] or whatever the insurance  would cover. The RNCM spoke to [place] at [facility] and he stated that someone overlooked the continuous glucose meter order and gave her a regular meter. The pcp gave the [facility] permission to give a verbal order for the [facility]. Marya Amsler received the verbal order and will process the order for the [employer]. He states the order should be completed today unless a PA is needed. The RNCM has followed up with the patient. She has not heard back from [facility]. Will touch base with the patient in the morning to see if she has obtained the [facility] and will set up a time to help her get started with monitoring. Will collaborate with the pcp and let the pcp know the current status and updates. 03-06-2022: The patient states that she called [facility] last night and they told her they were still working on her [employer]. She does not know when it will be available. The patient states she will call the [facility] when it is available. The RNCM advised that a call would be made on Friday to touch base with the patient. Will continue to monitor for changes and set up a time when the [facility] can assist with showing the patient how to work the meter for continuous blood glucose monitoring. ;        Reviewed prescribed diet with patient heart healthy/ADA; Counseled on importance of regular laboratory monitoring as prescribed;        Discussed plans with patient for ongoing care management follow up and provided patient with direct contact information for care management team;      Provided patient with written educational materials related to hypo and hyperglycemia and importance of correct treatment;       Reviewed scheduled/upcoming provider appointments including: 03-21-2022 at 340 pm;         Advised patient, providing education and rationale, to check cbg when you have symptoms of low or high blood sugar, before and after exercise, and continuous reader [facility] Elenor Legato ordered on 03-01-2022- RNCM called and spoke  to the pharmacist on 03-04-2022- Greg with [facility] for assistance as they did not give the patient the correct meter on 03-01-2022  and record. 03-06-2022: The patient states her blood sugar this am was 197. Denies any acute findings.       call provider for findings outside established parameters;       Review of patient status, including review of consultants reports, relevant laboratory and other  test results, and medications completed;       Advised patient to discuss blood sugar trends  with provider;       Patient Goals/Self-Care Activities: Take medications as prescribed   Attend all scheduled provider appointments Call pharmacy for medication refills 3-7 days in advance of running out of medications Attend church or other social activities Perform all self care activities independently  Perform IADL's (shopping, preparing meals, housekeeping, managing finances) independently Call provider office for new concerns or questions  Work with the social worker to address care coordination needs and will continue to work with the clinical team to address health care and disease management related needs call the Suicide and Crisis Lifeline: 988 call the [place] National Suicide Prevention Lifeline: 5108169194 or TTY: 4012693169 TTY 478-093-2851) to talk to a trained counselor call 1-800-273-TALK (toll free, 24 hour hotline) if experiencing a Mental Health or [place]  call office if I gain more than 2 pounds in one day or 5 pounds in one week keep legs up while sitting track weight in diary use salt in moderation watch for swelling in feet, ankles and legs every day weigh myself daily begin a heart failure diary bring diary to all appointments develop a rescue plan follow rescue plan if symptoms flare-up eat more whole grains, fruits and vegetables, lean meats and healthy fats know when to call the doctorfor changes in HF and worsening edema or swelling track symptoms  and what helps feel better or worse dress right for the weather, hot or cold schedule appointment with eye doctor check blood sugar at prescribed times: when you have symptoms of low or high blood sugar, before and after exercise, and continuous reader ordered for the patient on 03-01-2022  check feet daily for cuts, sores or redness enter blood sugar readings and medication or insulin into daily log take the blood sugar log to all doctor visits take the blood sugar meter to all doctor visits trim toenails straight across drink 6 to 8 glasses of water each day eat fish at least once per week fill half of plate with vegetables limit fast food meals to no more than 1 per week manage portion size prepare main meal at home 3 to 5 days each week read food labels for fat, fiber, carbohydrates and portion size reduce red meat to 2 to 3 times a week switch to sugar-free drinks keep feet up while sitting wash and dry feet carefully every day wear comfortable, cotton socks wear comfortable, well-fitting shoes       Plan:Telephone follow up appointment with care management team member scheduled for:  03-08-2022  Noreene Larsson RN, MSN, [facility] Mobile: [phone]

## 2022-03-07 ENCOUNTER — Ambulatory Visit: Payer: Self-pay

## 2022-03-07 DIAGNOSIS — I5033 Acute on chronic diastolic (congestive) heart failure: Secondary | ICD-10-CM

## 2022-03-07 DIAGNOSIS — Z794 Long term (current) use of insulin: Secondary | ICD-10-CM

## 2022-03-07 NOTE — Patient Instructions (Signed)
Visit Information  Thank you for taking time to visit with me today. Please don't hesitate to contact me if I can be of assistance to you before our next scheduled telephone appointment.  Following are the goals we discussed today:  Reviewed medications with patient and discussed importance of medication adherence. 03-04-2022: The pcp wants to add meal time insulin coverage but also would like the patient to have a continuous glucose reader. This was ordered on 03-01-2022. Call made to [facility] in [place] on 03-04-2022 to follow up on the glucose reader as the patient told the scheduler they gave her a regular meter. The pcp notified by secure chat messaging and gave the [facility] permission to give a verbal order for continuous glucose meter/ freestyle [place] or whatever the insurance would cover. The RNCM spoke to [place] at [facility] and he stated that someone overlooked the continuous glucose meter order and gave her a regular meter. The pcp gave the [facility] permission to give a verbal order for the [facility]. Marya Amsler received the verbal order and will process the order for the [employer]. He states the order should be completed today unless a PA is needed. The RNCM has followed up with the patient. She has not heard back from [facility]. Will touch base with the patient in the morning to see if she has obtained the [facility] and will set up a time to help her get started with monitoring. Will collaborate with the pcp and let the pcp know the current status and updates. 03-06-2022: The patient states that she called [facility] last night and they told her they were still working on her [employer]. She does not know when it will be available. The patient states she will call the [facility] when it is available. The RNCM advised that a call would be made on Friday to touch base with the patient. Will continue to monitor for changes and set up a time when the [facility] can assist with showing the patient how to  work the meter for continuous blood glucose monitoring. 03-07-2022: The patient called and left a message that [facility] said that the pharmacist told her that the insurance wanted the [facility] reader and system instead of [place]. Dr. Wynetta Emery notified and permission given to order the patient the Dexcom continuous glucose system for the patient. Call made to [facility] and spoke to [place]. Verbal order given to [facility] for the Dexcom continuous glucose monitoring system for sensors, reader, and transmitter. Magda Paganini said it may go through or may need a PA but if it needed a PA they would put it through. Magda Paganini states the [facility] system is the preferred system for Medicare to pay for. Dr. Wynetta Emery updated that the order has been placed. Call made back to the patient and informed her that [facility] has the new information for processing the Dexcom.  The patient has the [facility] number and will call the [facility] when the system is ready to be used. Magda Paganini informed the [facility] that the Dexcom was in stock and she would process the order ;        Reviewed prescribed diet with patient heart healthy/ADA; Counseled on importance of regular laboratory monitoring as prescribed;        Discussed plans with patient for ongoing care management follow up and provided patient with direct contact information for care management team;      Provided patient with written educational materials related to hypo and hyperglycemia and importance of correct treatment;  Reviewed scheduled/upcoming provider appointments including: 03-21-2022 at 340 pm;         Advised patient, providing education and rationale, to check cbg when you have symptoms of low or high blood sugar, before and after exercise, and continuous reader [facility] Elenor Legato ordered on 03-01-2022- RNCM called and spoke to the pharmacist on 03-04-2022- Greg with [facility] for assistance as they did not give the patient the correct meter on 03-01-2022  and record. 03-06-2022: The  patient states her blood sugar this am was 197. Denies any acute findings.  03-07-2022: Call made back to [facility] and spoke to [place]. New verbal order given to [facility] for the Dexcom continuous glucose monitoring system. Dr. Wynetta Emery updated. Will continue to monitor and set up a time to work with the patient on getting the system started and maintaining.   call provider for findings outside established parameters;       Review of patient status, including review of consultants reports, relevant laboratory and other test results, and medications completed;       Advised patient to discuss blood sugar trends  with provider;       Our next appointment is in-person at [facility] office on 03-11-2022  Please call the care guide team at [phone] if you need to cancel or reschedule your appointment.   If you are experiencing a Mental Health or [place] or need someone to talk to, please call the Suicide and Crisis Lifeline: 988 call the [place] National Suicide Prevention Lifeline: 2054957895 or TTY: 667-311-6477 TTY 4451172430) to talk to a trained counselor call 1-800-273-TALK (toll free, 24 hour hotline)   The patient verbalized understanding of instructions, educational materials, and care plan provided today and DECLINED offer to receive copy of patient instructions, educational materials, and care plan.   Noreene Larsson RN, MSN, [facility] Mobile: [phone]

## 2022-03-07 NOTE — Chronic Care Management (AMB) (Signed)
Chronic Care Management   CCM RN Visit Note  03/07/2022 Name: Karen Farley MRN: [id number] DOB: [birth year]  Subjective: Karen Farley is a 76 y.o. year old female who is a primary care patient of Valerie Roys, DO. The care management team was consulted for assistance with disease management and care coordination needs.    Engaged with patient by telephone for follow up visit in response to provider referral for case management and/or care coordination services.   Consent to Services:  The patient was given information about Chronic Care Management services, agreed to services, and gave verbal consent prior to initiation of services.  Please see initial visit note for detailed documentation.   Patient agreed to services and verbal consent obtained.   Assessment: Review of patient past medical history, allergies, medications, health status, including review of consultants reports, laboratory and other test data, was performed as part of comprehensive evaluation and provision of chronic care management services.   SDOH (Social Determinants of Health) assessments and interventions performed:    CCM Care Plan  No Known Allergies  Outpatient Encounter Medications as of 03/07/2022  Medication Sig Note   ACCU-CHEK GUIDE test strip USE TO CHECK BLOOD SUGAR DAILY    apixaban (ELIQUIS) 5 MG TABS tablet Take 1 tablet (5 mg total) by mouth 2 (two) times daily.    Cholecalciferol 125 MCG (5000 UT) TABS Take by mouth. 12/17/2021: Once a week    Continuous Glucose Monitor Sup KIT 1 each by Does not apply route continuous.    Elastic Bandages & Supports (MEDICAL COMPRESSION STOCKINGS) MISC by Does not apply route.    empagliflozin (JARDIANCE) 25 MG TABS tablet Take 1 tablet (25 mg total) by mouth daily before breakfast.    folic acid (FOLVITE) 1 MG tablet Take 1 tablet (1 mg total) by mouth daily.    insulin glargine, 2 Unit Dial, (TOUJEO MAX SOLOSTAR) 300 UNIT/ML Solostar Pen Inject 48  Units into the skin at bedtime.    Insulin Pen Needle (PEN NEEDLES) 30G X 8 MM MISC 1 each by Does not apply route daily.    iron polysaccharides (NIFEREX) 150 MG capsule TAKE 1 CAPSULE(150 MG) BY MOUTH DAILY    latanoprost (XALATAN) 0.005 % ophthalmic solution Place 1 drop into both eyes at bedtime.    metolazone (ZAROXOLYN) 2.5 MG tablet Take 1 tablet by mouth every Monday, Wednesday, and Friday. 12/17/2021: TID    metoprolol succinate (TOPROL-XL) 25 MG 24 hr tablet Take 0.5 tablets (12.5 mg total) by mouth daily.    midodrine (PROAMATINE) 10 MG tablet Take 1 tablet (10 mg total) by mouth 3 (three) times daily with meals.    Multiple Vitamin (MULTIVITAMIN WITH MINERALS) TABS tablet Take 1 tablet by mouth daily.    Niacin (VITAMIN B-3 PO) Take 5,000 Units by mouth once a week.    pantoprazole (PROTONIX) 40 MG tablet Take 1 tablet (40 mg total) by mouth 2 (two) times daily.    polyethylene glycol (MIRALAX / GLYCOLAX) 17 g packet Take 17 g by mouth daily.    potassium chloride SA (KLOR-CON) 20 MEQ tablet Take 2 tablets (40 mEq total) by mouth daily.    spironolactone (ALDACTONE) 25 MG tablet Take 1 tablet (25 mg total) by mouth daily.    torsemide (DEMADEX) 20 MG tablet TAKE 1 TO 2 TABLETS BY MOUTH DAILY. ONLY TAKE 2 TABLETS FOR 1 TO 3 DAYS AND IF PERSISTS, CALL WITH BAD SWELLING    vitamin B-12 (CYANOCOBALAMIN) 500 MCG  tablet Take 1 tablet (500 mcg total) by mouth daily.    No facility-administered encounter medications on file as of 03/07/2022.    Patient Active Problem List   Diagnosis Date Noted   Pulmonary hypertension, primary ([place]) 09/07/2021   Acute respiratory failure with hypoxia (HCC)    Shock circulatory ([place]) 08/05/2021   Pericardial effusion 08/05/2021   Acute blood loss anemia 08/04/2021   Anasarca    Stage 3b chronic kidney disease ([place]) 05/17/2021   Pleural effusion 05/17/2021   Acute on chronic diastolic CHF (congestive heart failure) ([place]) 05/17/2021   Obesity, Class  III, BMI 40-49.9 (morbid obesity) ([place]) 05/17/2021   Chronic anticoagulation 05/17/2021   Acute kidney injury superimposed on CKD ([place]) 04/11/2021   Hyponatremia 04/11/2021   Acute on chronic heart failure with preserved ejection fraction (HFpEF) ([place]) 04/11/2021   Chronic venous insufficiency 08/14/2020   PAD (peripheral artery disease) ([place]) 08/14/2020   Bilateral primary osteoarthritis of knee 04/13/2020   Type 2 diabetes mellitus ([place]) 04/13/2020   Acquired spondylolisthesis 12/17/2019   Osteoarthritis of knee 12/17/2019   Lumbar radiculopathy 12/17/2019   Atrial fibrillation ([place]) 11/20/2019   Closed fracture of radial styloid 08/06/2019   Displaced fracture of unspecified radial styloid process, initial encounter for closed fracture 08/06/2019   Controlled substance agreement signed 01/24/2018   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   Piriformis syndrome 07/01/2016   Sacroiliac joint dysfunction 01/09/2016   Joint disorder, unspecified 01/09/2016   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 diabetes mellitus ([place]) 04/28/2015   Lymphedema 03/27/2015   Edema 03/27/2015   Open-angle glaucoma, mild stage    Osteoarthritis of both knees    Type 2 diabetes mellitus with renal complication (HCC)    Osteopenia    Benign hypertensive renal disease    Hyperlipidemia    Degeneration of lumbosacral intervertebral disc     Conditions to be addressed/monitored:CHF and DMII  Care Plan : RNCM: General Plan of Care (Adult) for Chronic Disease Management and Care Coordination Needs  Updates made by Vanita Ingles, RN since 03/07/2022 12:00 AM     Problem: RNCM: Development of plan of care for Chronic Disease Management (HF, DM, CKD4)   Priority: High     Long-Range Goal: RNCM: Effective Management  of plan of care for Chronic Disease Management (HF, DM, CKD4)   Start Date: 03/04/2022  Expected End Date: 03/04/2022  Priority: High  Note:   Current Barriers:  Knowledge Deficits  related to plan of care for management of CHF, DMII, and CKD Stage 4  Care Coordination needs related to education needs on how to use the continuous glucose meter system and DM education and support  Chronic Disease Management support and education needs related to CHF, DMII, and CKD Stage 4  RNCM Clinical Goal(s):  Patient will verbalize understanding of plan for management of CHF, DMII, and CKD Stage 4 as evidenced by keeping appointments with the provider, following the plan of care, taking medications, following dietary restrictions, and working with the CCM team to effectively manage health and well being.  demonstrate understanding of rationale for each prescribed medication as evidenced by taking medications as prescribed and calling for refills before running out of medications.     attend all scheduled medical appointments: with pcp and specialist as evidenced by keeping appointments and calling for schedule change needs         demonstrate improved and ongoing adherence to prescribed treatment plan for CHF, DMII, and CKD Stage  4 as evidenced by lab work stable with trending down A1C level, stable VS, no exacerbations of chronic conditions.  demonstrate a decrease in CHF, DMII, and CKD Stage 4 exacerbations  as evidenced by no acute onset of chronic conditions, no unplanned hospital admits, and working with the CCM team to effectively manage health and well being.  demonstrate ongoing self health care management ability for effective management of chronic conditions  as evidenced by working with the CCM team through collaboration with Consulting civil engineer, provider, and care team.   Interventions: 1:1 collaboration with primary care provider regarding development and update of comprehensive plan of care as evidenced by provider attestation and co-signature Inter-disciplinary care team collaboration (see longitudinal plan of care) Evaluation of current treatment plan related to  self management  and patient's adherence to plan as established by provider   Heart Failure Interventions:  (Status: New goal.)  Long Term Goal  Wt Readings from Last 3 Encounters:  03/01/22 171 lb 6.4 oz (77.7 kg)  02/22/22 168 lb 4 oz (76.3 kg)  01/25/22 171 lb 3.2 oz (77.7 kg)   Basic overview and discussion of pathophysiology of Heart Failure reviewed Provided education on low sodium diet. 03-07-2022: The patient is compliant with heart healthy/ADA diet.  Reviewed Heart Failure Action Plan in depth and provided written copy Assessed need for readable accurate scales in home. 03-07-2022: The patient has a scale and uses on a consistent basis. Provided education about placing scale on hard, flat surface Advised patient to weigh each morning after emptying bladder Discussed importance of daily weight and advised patient to weigh and record daily Reviewed role of diuretics in prevention of fluid overload and management of heart failure Discussed the importance of keeping all appointments with provider Provided patient with education about the role of exercise in the management of heart failure Advised patient to discuss changes in heart health and CHF with provider Screening for signs and symptoms of depression related to chronic disease state  Assessed social determinant of health barriers  Chronic Kidney Disease (Status: New goal.)  Long Term Goal  Last practice recorded BP readings:  BP Readings from Last 3 Encounters:  03/01/22 99/64  02/22/22 100/60  01/25/22 99/66  Most recent eGFR/CrCl:  Lab Results  Component Value Date   EGFR 23 (L) 03/01/2022    No components found for: CRCL  Evaluation of current treatment plan related to chronic kidney disease self management and patient's adherence to plan as established by provider      Provided education to patient re: stroke prevention, s/s of heart attack and stroke    Reviewed prescribed diet heart healthy/ADA diet  Reviewed medications with  patient and discussed importance of compliance    Advised patient, providing education and rationale, to monitor blood pressure daily and record, calling PCP for findings outside established parameters    Discussed complications of poorly controlled blood pressure such as heart disease, stroke, circulatory complications, vision complications, kidney impairment, sexual dysfunction    Advised patient to discuss changes in kidney function or urinary patterns  with provider    Discussed plans with patient for ongoing care management follow up and provided patient with direct contact information for care management team    Screening for signs and symptoms of depression related to chronic disease state      Discussed the impact of chronic kidney disease on daily life and mental health and acknowledged and normalized feelings of disempowerment, fear, and frustration    Assessed  social determinant of health barriers    Provided education on kidney disease progression    Engage patient in early, proactive and ongoing discussion about goals of care and what matters most to them    Support coping and stress management by recognizing current strategies and assist in developing new strategies such as mindfulness, journaling, relaxation techniques, problem-solving      Diabetes:  (Status: New goal.) Long Term Goal   Lab Results  Component Value Date   HGBA1C 11.4 (H) 03/01/2022    Assessed patient's understanding of A1c goal: <7% Provided education to patient about basic DM disease process; Reviewed medications with patient and discussed importance of medication adherence. 03-04-2022: The pcp wants to add meal time insulin coverage but also would like the patient to have a continuous glucose reader. This was ordered on 03-01-2022. Call made to [facility] in [place] on 03-04-2022 to follow up on the glucose reader as the patient told the scheduler they gave her a regular meter. The pcp notified by secure  chat messaging and gave the [facility] permission to give a verbal order for continuous glucose meter/ freestyle Ethel or whatever the insurance would cover. The RNCM spoke to [place] at [facility] and he stated that someone overlooked the continuous glucose meter order and gave her a regular meter. The pcp gave the [facility] permission to give a verbal order for the [facility]. Marya Amsler received the verbal order and will process the order for the [employer]. He states the order should be completed today unless a PA is needed. The RNCM has followed up with the patient. She has not heard back from [facility]. Will touch base with the patient in the morning to see if she has obtained the [facility] and will set up a time to help her get started with monitoring. Will collaborate with the pcp and let the pcp know the current status and updates. 03-06-2022: The patient states that she called [facility] last night and they told her they were still working on her [employer]. She does not know when it will be available. The patient states she will call the [facility] when it is available. The RNCM advised that a call would be made on Friday to touch base with the patient. Will continue to monitor for changes and set up a time when the [facility] can assist with showing the patient how to work the meter for continuous blood glucose monitoring. 03-07-2022: The patient called and left a message that [facility] said that the pharmacist told her that the insurance wanted the [facility] reader and system instead of [facility]. Dr. Wynetta Emery notified and permission given to order the patient the Dexcom continuous glucose system for the patient. Call made to [facility] and spoke to [place]. Verbal order given to [facility] for the Dexcom continuous glucose monitoring system for sensors, reader, and transmitter. Magda Paganini said it may go through or may need a PA but if it needed a PA they would put it through. Magda Paganini states the [facility] system is the preferred  system for Medicare to pay for. Dr. Wynetta Emery updated that the order has been placed. Call made back to the patient and informed her that [facility] has the new information for processing the Dexcom.  The patient has the [facility] number and will call the [facility] when the system is ready to be used. Magda Paganini informed the [facility] that the Dexcom was in stock and she would process the order ;        Reviewed  prescribed diet with patient heart healthy/ADA; Counseled on importance of regular laboratory monitoring as prescribed;        Discussed plans with patient for ongoing care management follow up and provided patient with direct contact information for care management team;      Provided patient with written educational materials related to hypo and hyperglycemia and importance of correct treatment;       Reviewed scheduled/upcoming provider appointments including: 03-21-2022 at 340 pm;         Advised patient, providing education and rationale, to check cbg when you have symptoms of low or high blood sugar, before and after exercise, and continuous reader [facility] Elenor Legato ordered on 03-01-2022- RNCM called and spoke to the pharmacist on 03-04-2022- Greg with [facility] for assistance as they did not give the patient the correct meter on 03-01-2022  and record. 03-06-2022: The patient states her blood sugar this am was 197. Denies any acute findings.  03-07-2022: Call made back to [facility] and spoke to [place]. New verbal order given to [facility] for the Dexcom continuous glucose monitoring system. Dr. Wynetta Emery updated. Will continue to monitor and set up a time to work with the patient on getting the system started and maintaining.   call provider for findings outside established parameters;       Review of patient status, including review of consultants reports, relevant laboratory and other test results, and medications completed;       Advised patient to discuss blood sugar trends  with provider;       Patient Goals/Self-Care  Activities: Take medications as prescribed   Attend all scheduled provider appointments Call pharmacy for medication refills 3-7 days in advance of running out of medications Attend church or other social activities Perform all self care activities independently  Perform IADL's (shopping, preparing meals, housekeeping, managing finances) independently Call provider office for new concerns or questions  Work with the social worker to address care coordination needs and will continue to work with the clinical team to address health care and disease management related needs call the Suicide and Crisis Lifeline: 988 call the [place] National Suicide Prevention Lifeline: 364-669-4335 or TTY: (570) 862-8413 TTY 425-380-0816) to talk to a trained counselor call 1-800-273-TALK (toll free, 24 hour hotline) if experiencing a Mental Health or [place]  call office if I gain more than 2 pounds in one day or 5 pounds in one week keep legs up while sitting track weight in diary use salt in moderation watch for swelling in feet, ankles and legs every day weigh myself daily begin a heart failure diary bring diary to all appointments develop a rescue plan follow rescue plan if symptoms flare-up eat more whole grains, fruits and vegetables, lean meats and healthy fats know when to call the doctorfor changes in HF and worsening edema or swelling track symptoms and what helps feel better or worse dress right for the weather, hot or cold schedule appointment with eye doctor check blood sugar at prescribed times: when you have symptoms of low or high blood sugar, before and after exercise, and continuous reader ordered for the patient on 03-01-2022  check feet daily for cuts, sores or redness enter blood sugar readings and medication or insulin into daily log take the blood sugar log to all doctor visits take the blood sugar meter to all doctor visits trim toenails straight across drink 6 to  8 glasses of water each day eat fish at least once per week fill half of plate  with vegetables limit fast food meals to no more than 1 per week manage portion size prepare main meal at home 3 to 5 days each week read food labels for fat, fiber, carbohydrates and portion size reduce red meat to 2 to 3 times a week switch to sugar-free drinks keep feet up while sitting wash and dry feet carefully every day wear comfortable, cotton socks wear comfortable, well-fitting shoes       Plan:Telephone follow up appointment with care management team member scheduled for:  03-11-2022   Noreene Larsson RN, MSN, [facility] Mobile: [phone]

## 2022-03-12 ENCOUNTER — Ambulatory Visit: Payer: Self-pay

## 2022-03-12 DIAGNOSIS — N184 Chronic kidney disease, stage 4 (severe): Secondary | ICD-10-CM

## 2022-03-12 DIAGNOSIS — Z794 Long term (current) use of insulin: Secondary | ICD-10-CM

## 2022-03-12 NOTE — Chronic Care Management (AMB) (Signed)
Chronic Care Management   CCM RN Visit Note  03/12/2022 Name: Karen Farley MRN: [id number] DOB: 28-Jun-1946  Subjective: Karen Farley is a 76 y.o. year old female who is a primary care patient of Valerie Roys, DO. The care management team was consulted for assistance with disease management and care coordination needs.    Engaged with patient by telephone for follow up visit in response to provider referral for case management and/or care coordination services.   Consent to Services:  The patient was given information about Chronic Care Management services, agreed to services, and gave verbal consent prior to initiation of services.  Please see initial visit note for detailed documentation.   Patient agreed to services and verbal consent obtained.   Assessment: Review of patient past medical history, allergies, medications, health status, including review of consultants reports, laboratory and other test data, was performed as part of comprehensive evaluation and provision of chronic care management services.   SDOH (Social Determinants of Health) assessments and interventions performed:    CCM Care Plan  No Known Allergies  Outpatient Encounter Medications as of 03/12/2022  Medication Sig Note   ACCU-CHEK GUIDE test strip USE TO CHECK BLOOD SUGAR DAILY    apixaban (ELIQUIS) 5 MG TABS tablet Take 1 tablet (5 mg total) by mouth 2 (two) times daily.    Cholecalciferol 125 MCG (5000 UT) TABS Take by mouth. 12/17/2021: Once a week    Continuous Glucose Monitor Sup KIT 1 each by Does not apply route continuous.    Elastic Bandages & Supports (MEDICAL COMPRESSION STOCKINGS) MISC by Does not apply route.    empagliflozin (JARDIANCE) 25 MG TABS tablet Take 1 tablet (25 mg total) by mouth daily before breakfast.    folic acid (FOLVITE) 1 MG tablet Take 1 tablet (1 mg total) by mouth daily.    insulin glargine, 2 Unit Dial, (TOUJEO MAX SOLOSTAR) 300 UNIT/ML Solostar Pen Inject 48  Units into the skin at bedtime.    Insulin Pen Needle (PEN NEEDLES) 30G X 8 MM MISC 1 each by Does not apply route daily.    iron polysaccharides (NIFEREX) 150 MG capsule TAKE 1 CAPSULE(150 MG) BY MOUTH DAILY    latanoprost (XALATAN) 0.005 % ophthalmic solution Place 1 drop into both eyes at bedtime.    metolazone (ZAROXOLYN) 2.5 MG tablet Take 1 tablet by mouth every Monday, Wednesday, and Friday. 12/17/2021: TID    metoprolol succinate (TOPROL-XL) 25 MG 24 hr tablet Take 0.5 tablets (12.5 mg total) by mouth daily.    midodrine (PROAMATINE) 10 MG tablet Take 1 tablet (10 mg total) by mouth 3 (three) times daily with meals.    Multiple Vitamin (MULTIVITAMIN WITH MINERALS) TABS tablet Take 1 tablet by mouth daily.    Niacin (VITAMIN B-3 PO) Take 5,000 Units by mouth once a week.    pantoprazole (PROTONIX) 40 MG tablet Take 1 tablet (40 mg total) by mouth 2 (two) times daily.    polyethylene glycol (MIRALAX / GLYCOLAX) 17 g packet Take 17 g by mouth daily.    potassium chloride SA (KLOR-CON) 20 MEQ tablet Take 2 tablets (40 mEq total) by mouth daily.    spironolactone (ALDACTONE) 25 MG tablet Take 1 tablet (25 mg total) by mouth daily.    torsemide (DEMADEX) 20 MG tablet TAKE 1 TO 2 TABLETS BY MOUTH DAILY. ONLY TAKE 2 TABLETS FOR 1 TO 3 DAYS AND IF PERSISTS, CALL WITH BAD SWELLING    vitamin B-12 (CYANOCOBALAMIN) 500 MCG  tablet Take 1 tablet (500 mcg total) by mouth daily.    No facility-administered encounter medications on file as of 03/12/2022.    Patient Active Problem List   Diagnosis Date Noted   Pulmonary hypertension, primary ([place]) 09/07/2021   Acute respiratory failure with hypoxia (HCC)    Shock circulatory ([place]) 08/05/2021   Pericardial effusion 08/05/2021   Acute blood loss anemia 08/04/2021   Anasarca    Stage 3b chronic kidney disease ([place]) 05/17/2021   Pleural effusion 05/17/2021   Acute on chronic diastolic CHF (congestive heart failure) ([place]) 05/17/2021   Obesity, Class  III, BMI 40-49.9 (morbid obesity) ([place]) 05/17/2021   Chronic anticoagulation 05/17/2021   Acute kidney injury superimposed on CKD ([place]) 04/11/2021   Hyponatremia 04/11/2021   Acute on chronic heart failure with preserved ejection fraction (HFpEF) ([place]) 04/11/2021   Chronic venous insufficiency 08/14/2020   PAD (peripheral artery disease) ([place]) 08/14/2020   Bilateral primary osteoarthritis of knee 04/13/2020   Type 2 diabetes mellitus ([place]) 04/13/2020   Acquired spondylolisthesis 12/17/2019   Osteoarthritis of knee 12/17/2019   Lumbar radiculopathy 12/17/2019   Atrial fibrillation ([place]) 11/20/2019   Closed fracture of radial styloid 08/06/2019   Displaced fracture of unspecified radial styloid process, initial encounter for closed fracture 08/06/2019   Controlled substance agreement signed 01/24/2018   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   Piriformis syndrome 07/01/2016   Sacroiliac joint dysfunction 01/09/2016   Joint disorder, unspecified 01/09/2016   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 diabetes mellitus ([place]) 04/28/2015   Lymphedema 03/27/2015   Edema 03/27/2015   Open-angle glaucoma, mild stage    Osteoarthritis of both knees    Type 2 diabetes mellitus with renal complication (HCC)    Osteopenia    Benign hypertensive renal disease    Hyperlipidemia    Degeneration of lumbosacral intervertebral disc     Conditions to be addressed/monitored:DMII and CKD Stage 4  Care Plan : RNCM: General Plan of Care (Adult) for Chronic Disease Management and Care Coordination Needs  Updates made by Vanita Ingles, RN since 03/12/2022 12:00 AM     Problem: RNCM: Development of plan of care for Chronic Disease Management (HF, DM, CKD4)   Priority: High     Long-Range Goal: RNCM: Effective Management  of plan of care for Chronic Disease Management (HF, DM, CKD4)   Start Date: 03/04/2022  Expected End Date: 03/04/2022  Priority: High  Note:   Current Barriers:  Knowledge  Deficits related to plan of care for management of CHF, DMII, and CKD Stage 4  Care Coordination needs related to education needs on how to use the continuous glucose meter system and DM education and support  Chronic Disease Management support and education needs related to CHF, DMII, and CKD Stage 4  RNCM Clinical Goal(s):  Patient will verbalize understanding of plan for management of CHF, DMII, and CKD Stage 4 as evidenced by keeping appointments with the provider, following the plan of care, taking medications, following dietary restrictions, and working with the CCM team to effectively manage health and well being.  demonstrate understanding of rationale for each prescribed medication as evidenced by taking medications as prescribed and calling for refills before running out of medications.     attend all scheduled medical appointments: with pcp and specialist as evidenced by keeping appointments and calling for schedule change needs         demonstrate improved and ongoing adherence to prescribed treatment plan for CHF, DMII, and  CKD Stage 4 as evidenced by lab work stable with trending down A1C level, stable VS, no exacerbations of chronic conditions.  demonstrate a decrease in CHF, DMII, and CKD Stage 4 exacerbations  as evidenced by no acute onset of chronic conditions, no unplanned hospital admits, and working with the CCM team to effectively manage health and well being.  demonstrate ongoing self health care management ability for effective management of chronic conditions  as evidenced by working with the CCM team through collaboration with Consulting civil engineer, provider, and care team.   Interventions: 1:1 collaboration with primary care provider regarding development and update of comprehensive plan of care as evidenced by provider attestation and co-signature Inter-disciplinary care team collaboration (see longitudinal plan of care) Evaluation of current treatment plan related to  self  management and patient's adherence to plan as established by provider   Heart Failure Interventions:  (Status: New goal.)  Long Term Goal  Wt Readings from Last 3 Encounters:  03/01/22 171 lb 6.4 oz (77.7 kg)  02/22/22 168 lb 4 oz (76.3 kg)  01/25/22 171 lb 3.2 oz (77.7 kg)   Basic overview and discussion of pathophysiology of Heart Failure reviewed Provided education on low sodium diet. 03-07-2022: The patient is compliant with heart healthy/ADA diet.  Reviewed Heart Failure Action Plan in depth and provided written copy Assessed need for readable accurate scales in home. 03-07-2022: The patient has a scale and uses on a consistent basis. Provided education about placing scale on hard, flat surface Advised patient to weigh each morning after emptying bladder Discussed importance of daily weight and advised patient to weigh and record daily Reviewed role of diuretics in prevention of fluid overload and management of heart failure Discussed the importance of keeping all appointments with provider Provided patient with education about the role of exercise in the management of heart failure Advised patient to discuss changes in heart health and CHF with provider Screening for signs and symptoms of depression related to chronic disease state  Assessed social determinant of health barriers  Chronic Kidney Disease (Status: New goal.)  Long Term Goal  Last practice recorded BP readings:  BP Readings from Last 3 Encounters:  03/01/22 99/64  02/22/22 100/60  01/25/22 99/66  Most recent eGFR/CrCl:  Lab Results  Component Value Date   EGFR 23 (L) 03/01/2022    No components found for: CRCL  Evaluation of current treatment plan related to chronic kidney disease self management and patient's adherence to plan as established by provider. 03-12-2022: The patient is compliant with the plan of care and sees the provider on a regular basis.      Provided education to patient re: stroke prevention, s/s  of heart attack and stroke    Reviewed prescribed diet heart healthy/ADA diet. 03-12-2022: The patient is monitoring her dietary intake on a more consistent basis. Education and support given Reviewed medications with patient and discussed importance of compliance. 03-12-2022: The patient is compliant with medications. Denies any medication needs today.    Advised patient, providing education and rationale, to monitor blood pressure daily and record, calling PCP for findings outside established parameters    Discussed complications of poorly controlled blood pressure such as heart disease, stroke, circulatory complications, vision complications, kidney impairment, sexual dysfunction    Advised patient to discuss changes in kidney function or urinary patterns  with provider    Discussed plans with patient for ongoing care management follow up and provided patient with direct contact information for care management  team    Screening for signs and symptoms of depression related to chronic disease state      Discussed the impact of chronic kidney disease on daily life and mental health and acknowledged and normalized feelings of disempowerment, fear, and frustration    Assessed social determinant of health barriers    Provided education on kidney disease progression    Engage patient in early, proactive and ongoing discussion about goals of care and what matters most to them    Support coping and stress management by recognizing current strategies and assist in developing new strategies such as mindfulness, journaling, relaxation techniques, problem-solving      Diabetes:  (Status: New goal.) Long Term Goal   Lab Results  Component Value Date   HGBA1C 11.4 (H) 03/01/2022    Assessed patient's understanding of A1c goal: <7% Provided education to patient about basic DM disease process; Reviewed medications with patient and discussed importance of medication adherence. 03-04-2022: The pcp wants to add  meal time insulin coverage but also would like the patient to have a continuous glucose reader. This was ordered on 03-01-2022. Call made to [facility] in [place] on 03-04-2022 to follow up on the glucose reader as the patient told the scheduler they gave her a regular meter. The pcp notified by secure chat messaging and gave the [facility] permission to give a verbal order for continuous glucose meter/ freestyle [place] or whatever the insurance would cover. The RNCM spoke to Dennis at [facility] and he stated that someone overlooked the continuous glucose meter order and gave her a regular meter. The pcp gave the [facility] permission to give a verbal order for the [facility]. Marya Amsler received the verbal order and will process the order for the [employer]. He states the order should be completed today unless a PA is needed. The RNCM has followed up with the patient. She has not heard back from [facility]. Will touch base with the patient in the morning to see if she has obtained the [facility] and will set up a time to help her get started with monitoring. Will collaborate with the pcp and let the pcp know the current status and updates. 03-06-2022: The patient states that she called [facility] last night and they told her they were still working on her [employer]. She does not know when it will be available. The patient states she will call the [facility] when it is available. The RNCM advised that a call would be made on Friday to touch base with the patient. Will continue to monitor for changes and set up a time when the [facility] can assist with showing the patient how to work the meter for continuous blood glucose monitoring. 03-07-2022: The patient called and left a message that [facility] said that the pharmacist told her that the insurance wanted the [facility] reader and system instead of [place]. Dr. Wynetta Emery notified and permission given to order the patient the Dexcom continuous glucose system for the patient.  Call made to [facility] and spoke to [place]. Verbal order given to [facility] for the Dexcom continuous glucose monitoring system for sensors, reader, and transmitter. Magda Paganini said it may go through or may need a PA but if it needed a PA they would put it through. Magda Paganini states the [facility] system is the preferred system for Medicare to pay for. Dr. Wynetta Emery updated that the order has been placed. Call made back to the patient and informed her that [facility] has the new information for  processing the Dexcom.  The patient has the [facility] number and will call the [facility] when the system is ready to be used. Magda Paganini informed the [facility] that the Dexcom was in stock and she would process the order. 03-12-2022: The patient states that she called [facility] about 30 minutes before the [facility] called for follow up. They tell her they are still working on the order. The patient states she called Saturday also. She said it must have something to do with Medicare. The patient states that as soon as she hears from them and she has the continuous glucose system she will let the RNCM know.  ;        Reviewed prescribed diet with patient heart healthy/ADA; Counseled on importance of regular laboratory monitoring as prescribed;        Discussed plans with patient for ongoing care management follow up and provided patient with direct contact information for care management team;      Provided patient with written educational materials related to hypo and hyperglycemia and importance of correct treatment;       Reviewed scheduled/upcoming provider appointments including: 03-21-2022 at 340 pm;         Advised patient, providing education and rationale, to check cbg when you have symptoms of low or high blood sugar, before and after exercise, and continuous reader [facility] Elenor Legato ordered on 03-01-2022- RNCM called and spoke to the pharmacist on 03-04-2022- Greg with [facility] for assistance as they did not give the patient the correct meter on 03-01-2022   and record. 03-06-2022: The patient states her blood sugar this am was 197. Denies any acute findings.  03-07-2022: Call made back to [facility] and spoke to [place]. New verbal order given to [facility] for the Dexcom continuous glucose monitoring system. Dr. Wynetta Emery updated. Will continue to monitor and set up a time to work with the patient on getting the system started and maintaining.  03-12-2022: The patient states her blood sugars have been better. She states that it was down to 118 yesterday am and it was 137 this am. She is checking it one time a day. Is having a hard time getting blood to come out to test. Education on running fingers under warm water and massaging the area before sticking to see if this would help with finger sticks. Education and support provided.  call provider for findings outside established parameters;       Review of patient status, including review of consultants reports, relevant laboratory and other test results, and medications completed;       Advised patient to discuss blood sugar trends  with provider;       Patient Goals/Self-Care Activities: Take medications as prescribed   Attend all scheduled provider appointments Call pharmacy for medication refills 3-7 days in advance of running out of medications Attend church or other social activities Perform all self care activities independently  Perform IADL's (shopping, preparing meals, housekeeping, managing finances) independently Call provider office for new concerns or questions  Work with the social worker to address care coordination needs and will continue to work with the clinical team to address health care and disease management related needs call the Suicide and Crisis Lifeline: 988 call the [place] National Suicide Prevention Lifeline: 913-109-1549 or TTY: (725)552-2912 TTY (587)825-2732) to talk to a trained counselor call 1-800-273-TALK (toll free, 24 hour hotline) if experiencing a Mental Health or [place]  call office if I gain more than 2 pounds in one day or  5 pounds in one week keep legs up while sitting track weight in diary use salt in moderation watch for swelling in feet, ankles and legs every day weigh myself daily begin a heart failure diary bring diary to all appointments develop a rescue plan follow rescue plan if symptoms flare-up eat more whole grains, fruits and vegetables, lean meats and healthy fats know when to call the doctorfor changes in HF and worsening edema or swelling track symptoms and what helps feel better or worse dress right for the weather, hot or cold schedule appointment with eye doctor check blood sugar at prescribed times: when you have symptoms of low or high blood sugar, before and after exercise, and continuous reader ordered for the patient on 03-01-2022  check feet daily for cuts, sores or redness enter blood sugar readings and medication or insulin into daily log take the blood sugar log to all doctor visits take the blood sugar meter to all doctor visits trim toenails straight across drink 6 to 8 glasses of water each day eat fish at least once per week fill half of plate with vegetables limit fast food meals to no more than 1 per week manage portion size prepare main meal at home 3 to 5 days each week read food labels for fat, fiber, carbohydrates and portion size reduce red meat to 2 to 3 times a week switch to sugar-free drinks keep feet up while sitting wash and dry feet carefully every day wear comfortable, cotton socks wear comfortable, well-fitting shoes       Plan:Telephone follow up appointment with care management team member scheduled for:  03-15-2022  Noreene Larsson RN, MSN, [facility] Mobile: [phone]

## 2022-03-12 NOTE — Patient Instructions (Signed)
Visit Information  Thank you for taking time to visit with me today. Please don't hesitate to contact me if I can be of assistance to you before our next scheduled telephone appointment.  Following are the goals we discussed today:  Heart Failure Interventions:  (Status: New goal.)  Long Term Goal     Wt Readings from Last 3 Encounters:  03/01/22 171 lb 6.4 oz (77.7 kg)  02/22/22 168 lb 4 oz (76.3 kg)  01/25/22 171 lb 3.2 oz (77.7 kg)    Basic overview and discussion of pathophysiology of Heart Failure reviewed Provided education on low sodium diet. 03-07-2022: The patient is compliant with heart healthy/ADA diet.  Reviewed Heart Failure Action Plan in depth and provided written copy Assessed need for readable accurate scales in home. 03-07-2022: The patient has a scale and uses on a consistent basis. Provided education about placing scale on hard, flat surface Advised patient to weigh each morning after emptying bladder Discussed importance of daily weight and advised patient to weigh and record daily Reviewed role of diuretics in prevention of fluid overload and management of heart failure Discussed the importance of keeping all appointments with provider Provided patient with education about the role of exercise in the management of heart failure Advised patient to discuss changes in heart health and CHF with provider Screening for signs and symptoms of depression related to chronic disease state  Assessed social determinant of health barriers   Chronic Kidney Disease (Status: New goal.)  Long Term Goal  Last practice recorded BP readings:     BP Readings from Last 3 Encounters:  03/01/22 99/64  02/22/22 100/60  01/25/22 99/66  Most recent eGFR/CrCl:       Lab Results  Component Value Date    EGFR 23 (L) 03/01/2022    No components found for: CRCL   Evaluation of current treatment plan related to chronic kidney disease self management and patient's adherence to plan as  established by provider. 03-12-2022: The patient is compliant with the plan of care and sees the provider on a regular basis.      Provided education to patient re: stroke prevention, s/s of heart attack and stroke    Reviewed prescribed diet heart healthy/ADA diet. 03-12-2022: The patient is monitoring her dietary intake on a more consistent basis. Education and support given Reviewed medications with patient and discussed importance of compliance. 03-12-2022: The patient is compliant with medications. Denies any medication needs today.    Advised patient, providing education and rationale, to monitor blood pressure daily and record, calling PCP for findings outside established parameters    Discussed complications of poorly controlled blood pressure such as heart disease, stroke, circulatory complications, vision complications, kidney impairment, sexual dysfunction    Advised patient to discuss changes in kidney function or urinary patterns  with provider    Discussed plans with patient for ongoing care management follow up and provided patient with direct contact information for care management team    Screening for signs and symptoms of depression related to chronic disease state      Discussed the impact of chronic kidney disease on daily life and mental health and acknowledged and normalized feelings of disempowerment, fear, and frustration    Assessed social determinant of health barriers    Provided education on kidney disease progression    Engage patient in early, proactive and ongoing discussion about goals of care and what matters most to them    Support coping and stress management by  recognizing current strategies and assist in developing new strategies such as mindfulness, journaling, relaxation techniques, problem-solving        Diabetes:  (Status: New goal.) Long Term Goal         Lab Results  Component Value Date    HGBA1C 11.4 (H) 03/01/2022    Assessed patient's understanding  of A1c goal: <7% Provided education to patient about basic DM disease process; Reviewed medications with patient and discussed importance of medication adherence. 03-04-2022: The pcp wants to add meal time insulin coverage but also would like the patient to have a continuous glucose reader. This was ordered on 03-01-2022. Call made to [place] in [place] on 03-04-2022 to follow up on the glucose reader as the patient told the scheduler they gave her a regular meter. The pcp notified by secure chat messaging and gave the [facility] permission to give a verbal order for continuous glucose meter/ freestyle [place] or whatever the insurance would cover. The RNCM spoke to [place] at [facility] and he stated that someone overlooked the continuous glucose meter order and gave her a regular meter. The pcp gave the [facility] permission to give a verbal order for the [facility]. Marya Amsler received the verbal order and will process the order for the [employer]. He states the order should be completed today unless a PA is needed. The RNCM has followed up with the patient. She has not heard back from [facility]. Will touch base with the patient in the morning to see if she has obtained the [facility] and will set up a time to help her get started with monitoring. Will collaborate with the pcp and let the pcp know the current status and updates. 03-06-2022: The patient states that she called [facility] last night and they told her they were still working on her [employer]. She does not know when it will be available. The patient states she will call the [facility] when it is available. The RNCM advised that a call would be made on Friday to touch base with the patient. Will continue to monitor for changes and set up a time when the [facility] can assist with showing the patient how to work the meter for continuous blood glucose monitoring. 03-07-2022: The patient called and left a message that [facility] said that the pharmacist told her  that the insurance wanted the [facility] reader and system instead of [place]. Dr. Wynetta Emery notified and permission given to order the patient the Dexcom continuous glucose system for the patient. Call made to [facility] and spoke to [place]. Verbal order given to [facility] for the Dexcom continuous glucose monitoring system for sensors, reader, and transmitter. Magda Paganini said it may go through or may need a PA but if it needed a PA they would put it through. Magda Paganini states the [facility] system is the preferred system for Medicare to pay for. Dr. Wynetta Emery updated that the order has been placed. Call made back to the patient and informed her that [facility] has the new information for processing the Dexcom.  The patient has the [facility] number and will call the [facility] when the system is ready to be used. Magda Paganini informed the [facility] that the Dexcom was in stock and she would process the order. 03-12-2022: The patient states that she called [facility] about 30 minutes before the [facility] called for follow up. They tell her they are still working on the order. The patient states she called Saturday also. She said it must have something to do with  Medicare. The patient states that as soon as she hears from them and she has the continuous glucose system she will let the RNCM know.  ;        Reviewed prescribed diet with patient heart healthy/ADA; Counseled on importance of regular laboratory monitoring as prescribed;        Discussed plans with patient for ongoing care management follow up and provided patient with direct contact information for care management team;      Provided patient with written educational materials related to hypo and hyperglycemia and importance of correct treatment;       Reviewed scheduled/upcoming provider appointments including: 03-21-2022 at 340 pm;         Advised patient, providing education and rationale, to check cbg when you have symptoms of low or high blood sugar, before and after exercise, and continuous  reader [facility] Elenor Legato ordered on 03-01-2022- RNCM called and spoke to the pharmacist on 03-04-2022- Greg with [facility] for assistance as they did not give the patient the correct meter on 03-01-2022  and record. 03-06-2022: The patient states her blood sugar this am was 197. Denies any acute findings.  03-07-2022: Call made back to [facility] and spoke to [place]. New verbal order given to [facility] for the Dexcom continuous glucose monitoring system. Dr. Wynetta Emery updated. Will continue to monitor and set up a time to work with the patient on getting the system started and maintaining.  03-12-2022: The patient states her blood sugars have been better. She states that it was down to 118 yesterday am and it was 137 this am. She is checking it one time a day. Is having a hard time getting blood to come out to test. Education on running fingers under warm water and massaging the area before sticking to see if this would help with finger sticks. Education and support provided.  call provider for findings outside established parameters;       Review of patient status, including review of consultants reports, relevant laboratory and other test results, and medications completed;       Advised patient to discuss blood sugar trends  with provider;     Our next appointment is by telephone on 03-15-2022  Please call the care guide team at [phone] if you need to cancel or reschedule your appointment.   If you are experiencing a Mental Health or [place] or need someone to talk to, please call the Suicide and Crisis Lifeline: 988 call the [place] National Suicide Prevention Lifeline: 772 791 0035 or TTY: 4356239529 TTY 781-727-2956) to talk to a trained counselor call 1-800-273-TALK (toll free, 24 hour hotline)   The patient verbalized understanding of instructions, educational materials, and care plan provided today and DECLINED offer to receive copy of patient instructions, educational materials, and  care plan.   Noreene Larsson RN, MSN, [facility] Mobile: [phone]

## 2022-03-14 ENCOUNTER — Other Ambulatory Visit: Payer: Self-pay | Admitting: Family Medicine

## 2022-03-18 NOTE — Telephone Encounter (Signed)
Requested medication (s) are due for refill today - yes  Requested medication (s) are on the active medication list -yes  Future visit scheduled -yes  Last refill: 09/11/21 #90 1RF  Notes to clinic: outside provider, directions on requested Rx do not match Rx current on list- sent for provider review   Requested Prescriptions  Pending Prescriptions Disp Refills   potassium chloride SA (KLOR-CON M) 20 MEQ tablet [Pharmacy Med Name: POTASSIUM CL 20MEQ ER TABLETS] 360 tablet     Sig: TAKE 2 TABLETS( 40 MEQ TOTAL) BY MOUTH TWICE DAILY     Endocrinology:  Minerals - Potassium Supplementation Failed - 03/14/2022  2:34 PM      Failed - K in normal range and within 360 days    Potassium  Date Value Ref Range Status  03/01/2022 3.4 (L) 3.5 - 5.2 mmol/L Final         Failed - Cr in normal range and within 360 days    Creatinine, Ser  Date Value Ref Range Status  03/01/2022 2.16 (H) 0.57 - 1.00 mg/dL Final   Creatinine, Urine  Date Value Ref Range Status  04/11/2021 139 mg/dL Final         Passed - Valid encounter within last 12 months    Recent Outpatient Visits           2 weeks ago Type 2 diabetes mellitus with diabetic nephropathy, with long-term current use of insulin ([place])   [place], [name], DO   1 month ago Type 2 diabetes mellitus with diabetic nephropathy, with long-term current use of insulin ([place])   [place], [name], DO   1 month ago Type 2 diabetes mellitus with diabetic nephropathy, with long-term current use of insulin ([place])   [place], [name], DO   1 month ago Type 2 diabetes mellitus with diabetic nephropathy, with long-term current use of insulin ([place])   [place], [name], DO   2 months ago Dizziness   [place], [place], DO       Future Appointments             In 5 months Gollan, Kathlene November, MD [place], [facility]                 Requested Prescriptions  Pending Prescriptions Disp Refills   potassium chloride SA (KLOR-CON M) 20 MEQ tablet [Pharmacy Med Name: POTASSIUM CL 20MEQ ER TABLETS] 360 tablet     Sig: TAKE 2 TABLETS( 40 MEQ TOTAL) BY MOUTH TWICE DAILY     Endocrinology:  Minerals - Potassium Supplementation Failed - 03/14/2022  2:34 PM      Failed - K in normal range and within 360 days    Potassium  Date Value Ref Range Status  03/01/2022 3.4 (L) 3.5 - 5.2 mmol/L Final         Failed - Cr in normal range and within 360 days    Creatinine, Ser  Date Value Ref Range Status  03/01/2022 2.16 (H) 0.57 - 1.00 mg/dL Final   Creatinine, Urine  Date Value Ref Range Status  04/11/2021 139 mg/dL Final         Passed - Valid encounter within last 12 months    Recent Outpatient Visits           2 weeks ago Type 2 diabetes mellitus with diabetic nephropathy, with long-term current use of insulin ([place])  [place], [name], DO   1 month ago Type 2 diabetes mellitus with diabetic nephropathy, with long-term current use of insulin ([place])   [place], [name], DO   1 month ago Type 2 diabetes mellitus with diabetic nephropathy, with long-term current use of insulin ([place])   [place], [name], DO   1 month ago Type 2 diabetes mellitus with diabetic nephropathy, with long-term current use of insulin ([place])   [place], [place], DO   2 months ago Dizziness   [place], [place], DO       Future Appointments             In 5 months Gollan, Kathlene November, MD [facility], [facility]

## 2022-03-20 ENCOUNTER — Telehealth: Payer: Self-pay

## 2022-03-20 DIAGNOSIS — I509 Heart failure, unspecified: Secondary | ICD-10-CM

## 2022-03-20 DIAGNOSIS — Z794 Long term (current) use of insulin: Secondary | ICD-10-CM

## 2022-03-20 DIAGNOSIS — E1122 Type 2 diabetes mellitus with diabetic chronic kidney disease: Secondary | ICD-10-CM | POA: Diagnosis not present

## 2022-03-20 DIAGNOSIS — N184 Chronic kidney disease, stage 4 (severe): Secondary | ICD-10-CM

## 2022-03-20 NOTE — Telephone Encounter (Signed)
Unable to initiate PA via CoverMyMeds, called [employer] and was transferred to [employer] [phone]). Initiated order for Dexcom continuous glucose monitor G6 via telephone successfully. [facility] will be calling patient for "order consent", patient is aware to keep a look out for phone call so order can be completed. If patient does not hear from them soon she can call [phone], patient ID is [id number].

## 2022-03-20 NOTE — Telephone Encounter (Signed)
-----   Message from Valerie Roys, DO sent at 03/19/2022  9:10 AM EDT ----- Regarding: FW: Patietn still does not hae Decom Can we please do a PA for her continuous glucose monitor? ----- Message ----- From: Vanita Ingles, RN Sent: 03/19/2022   9:07 AM EDT To: Valerie Roys, DO, [facility] Clinical Subject: Patietn still does not hae Decom               Hey Dr. Wynetta Emery, The patient left a VM over the holiday weekend saying that she finally talked to the pharmacist at [facility] and they told her that the MD office had to contact the insurance before they would approve for her to have the the Dexcom. (They had been telling her and me that they were working on it). When I talked to [facility] last week they were working on it and had done a PA.  I am letting you know as I generally I don't work on these types of things, but am happy to do what I can. Do you know what else we need to do to get this going so she can get what she needs? Please let me know.  I wanted to reach out to you as I just received the patients message this am after the [place] weekend.  Thanks,  Pam

## 2022-03-21 ENCOUNTER — Telehealth: Payer: Medicare PPO | Admitting: Family Medicine

## 2022-04-05 ENCOUNTER — Other Ambulatory Visit: Payer: Self-pay | Admitting: Family Medicine

## 2022-04-05 NOTE — Telephone Encounter (Signed)
Requested medication (s) are due for refill today: yes, no details from historical provider  Requested medication (s) are on the active medication list: yes  Last refill:  10/08/21  Future visit scheduled: no, seen 03/01/22  Notes to clinic:  Historical Provider, please assess.       Requested Prescriptions  Pending Prescriptions Disp Refills   metolazone (ZAROXOLYN) 2.5 MG tablet      Sig: Take 1 tablet (2.5 mg total) by mouth every Monday, Wednesday, and Friday.     Cardiovascular:  Diuretics - Thiazide - metolazone Failed - 04/05/2022  5:18 PM      Failed - K in normal range and within 180 days    Potassium  Date Value Ref Range Status  03/01/2022 3.4 (L) 3.5 - 5.2 mmol/L Final         Failed - Cr in normal range and within 180 days    Creatinine, Ser  Date Value Ref Range Status  03/01/2022 2.16 (H) 0.57 - 1.00 mg/dL Final   Creatinine, Urine  Date Value Ref Range Status  04/11/2021 139 mg/dL Final         Passed - Na in normal range and within 180 days    Sodium  Date Value Ref Range Status  03/01/2022 137 134 - 144 mmol/L Final         Passed - Last BP in normal range    BP Readings from Last 1 Encounters:  03/01/22 99/64         Passed - Valid encounter within last 6 months    Recent Outpatient Visits           1 month ago Type 2 diabetes mellitus with diabetic nephropathy, with long-term current use of insulin ([place])   [place], [name], DO   1 month ago Type 2 diabetes mellitus with diabetic nephropathy, with long-term current use of insulin ([place])   [place], [name], DO   2 months ago Type 2 diabetes mellitus with diabetic nephropathy, with long-term current use of insulin ([place])   [place], [name], DO   2 months ago Type 2 diabetes mellitus with diabetic nephropathy, with long-term current use of insulin ([place])   [place], [place], DO   2 months ago  Dizziness   [place], [place], DO       Future Appointments             In 4 months Gollan, Kathlene November, MD [facility], [facility]

## 2022-04-05 NOTE — Addendum Note (Signed)
Addended by: Matilde Sprang on: 04/05/2022 05:18 PM   Modules accepted: Orders

## 2022-04-05 NOTE — Telephone Encounter (Signed)
Medication Refill - Medication: metolazone (ZAROXOLYN) 2.5 MG tablet  Has the patient contacted their pharmacy? yes  (Agent: If yes, when and what did the pharmacy advise?) contact provider   Preferred Pharmacy (with phone number or street name):  [facility], [place] - [address] Phone:  [phone]  Fax:  [phone]      Has the patient been seen for an appointment in the last year OR does the patient have an upcoming appointment? Yes.    Agent: Please be advised that RX refills may take up to 3 business days. We ask that you follow-up with your pharmacy.

## 2022-04-16 ENCOUNTER — Other Ambulatory Visit: Payer: Self-pay | Admitting: Family Medicine

## 2022-04-17 ENCOUNTER — Telehealth: Payer: Self-pay | Admitting: Cardiovascular Disease

## 2022-04-17 NOTE — Telephone Encounter (Signed)
*  STAT* If patient is at the pharmacy, call can be transferred to refill team.   1. Which medications need to be refilled? (please list name of each medication and dose if known) metolazone (ZAROXOLYN) 2.5 MG tablet  2. Which pharmacy/location (including street and city if local pharmacy) is medication to be sent to? [place] they need a 30 day or 90 day supply? [id number]    Pharmacy is calling stating that this patient is a new pt to them, and when they called the provider who originally filled it, they stated this needed to be transferred to her cardiologist for refill.

## 2022-04-18 MED ORDER — METOLAZONE 2.5 MG PO TABS: 2.5000 mg | ORAL_TABLET | ORAL | 1 refills | Status: DC

## 2022-04-18 NOTE — Telephone Encounter (Signed)
Requested Prescriptions   Signed Prescriptions Disp Refills   metolazone (ZAROXOLYN) 2.5 MG tablet 36 tablet 1    Sig: Take 1 tablet (2.5 mg total) by mouth every Monday, Wednesday, and Friday.    Authorizing Provider: [name]    Ordering User: Raelene Bott, Dontreal Miera L

## 2022-04-22 DIAGNOSIS — M170 Bilateral primary osteoarthritis of knee: Secondary | ICD-10-CM | POA: Diagnosis not present

## 2022-04-22 DIAGNOSIS — S92505A Nondisplaced unspecified fracture of left lesser toe(s), initial encounter for closed fracture: Secondary | ICD-10-CM | POA: Diagnosis not present

## 2022-04-26 DIAGNOSIS — E1129 Type 2 diabetes mellitus with other diabetic kidney complication: Secondary | ICD-10-CM | POA: Diagnosis not present

## 2022-05-06 ENCOUNTER — Telehealth: Payer: Self-pay

## 2022-05-06 ENCOUNTER — Ambulatory Visit (INDEPENDENT_AMBULATORY_CARE_PROVIDER_SITE_OTHER): Payer: Medicare PPO

## 2022-05-06 DIAGNOSIS — Z794 Long term (current) use of insulin: Secondary | ICD-10-CM

## 2022-05-06 DIAGNOSIS — N184 Chronic kidney disease, stage 4 (severe): Secondary | ICD-10-CM

## 2022-05-06 DIAGNOSIS — I5033 Acute on chronic diastolic (congestive) heart failure: Secondary | ICD-10-CM

## 2022-05-06 NOTE — Telephone Encounter (Signed)
  Care Management   Follow Up Note   05/06/2022 Name: Karen Farley MRN: [id number] DOB: [birth year]   Referred by: Valerie Roys, DO Reason for referral : Chronic Care Management (RNCM: Follow up call. The patient calling to tell the RNCM that she has her dexcom)   The patient returned the call to the [place] will see the patient in the office tomorrow for follow up and how to work the Dexcom  Follow Up Plan: Face to Face appointment with care management team member scheduled for: 05-07-2022 at 1130 am  SIGNATURE

## 2022-05-06 NOTE — Chronic Care Management (AMB) (Signed)
Chronic Care Management   CCM RN Visit Note  05/06/2022 Name: Karen Farley MRN: [id number] DOB: 02-Jul-1946  Subjective: Karen Farley is a 76 y.o. year old female who is a primary care patient of Valerie Roys, DO. The care management team was consulted for assistance with disease management and care coordination needs.    Engaged with patient by telephone for follow up visit in response to provider referral for case management and/or care coordination services.   Consent to Services:  The patient was given information about Chronic Care Management services, agreed to services, and gave verbal consent prior to initiation of services.  Please see initial visit note for detailed documentation.   Patient agreed to services and verbal consent obtained.   Assessment: Review of patient past medical history, allergies, medications, health status, including review of consultants reports, laboratory and other test data, was performed as part of comprehensive evaluation and provision of chronic care management services.   SDOH (Social Determinants of Health) assessments and interventions performed:    CCM Care Plan  No Known Allergies  Outpatient Encounter Medications as of 05/06/2022  Medication Sig Note   ACCU-CHEK GUIDE test strip USE TO CHECK BLOOD SUGAR DAILY    apixaban (ELIQUIS) 5 MG TABS tablet Take 1 tablet (5 mg total) by mouth 2 (two) times daily.    Cholecalciferol 125 MCG (5000 UT) TABS Take by mouth. 12/17/2021: Once a week    Continuous Glucose Monitor Sup KIT 1 each by Does not apply route continuous.    Elastic Bandages & Supports (MEDICAL COMPRESSION STOCKINGS) MISC by Does not apply route.    empagliflozin (JARDIANCE) 25 MG TABS tablet Take 1 tablet (25 mg total) by mouth daily before breakfast.    folic acid (FOLVITE) 1 MG tablet Take 1 tablet (1 mg total) by mouth daily.    insulin glargine, 2 Unit Dial, (TOUJEO MAX SOLOSTAR) 300 UNIT/ML Solostar Pen Inject 48  Units into the skin at bedtime.    Insulin Pen Needle (PEN NEEDLES) 30G X 8 MM MISC 1 each by Does not apply route daily.    iron polysaccharides (NIFEREX) 150 MG capsule TAKE 1 CAPSULE(150 MG) BY MOUTH DAILY    latanoprost (XALATAN) 0.005 % ophthalmic solution Place 1 drop into both eyes at bedtime.    metolazone (ZAROXOLYN) 2.5 MG tablet Take 1 tablet (2.5 mg total) by mouth every Monday, Wednesday, and Friday.    metoprolol succinate (TOPROL-XL) 25 MG 24 hr tablet Take 0.5 tablets (12.5 mg total) by mouth daily.    midodrine (PROAMATINE) 10 MG tablet Take 1 tablet (10 mg total) by mouth 3 (three) times daily with meals.    Multiple Vitamin (MULTIVITAMIN WITH MINERALS) TABS tablet Take 1 tablet by mouth daily.    Niacin (VITAMIN B-3 PO) Take 5,000 Units by mouth once a week.    pantoprazole (PROTONIX) 40 MG tablet Take 1 tablet (40 mg total) by mouth 2 (two) times daily.    polyethylene glycol (MIRALAX / GLYCOLAX) 17 g packet Take 17 g by mouth daily.    potassium chloride SA (KLOR-CON) 20 MEQ tablet Take 2 tablets (40 mEq total) by mouth daily.    spironolactone (ALDACTONE) 25 MG tablet Take 1 tablet (25 mg total) by mouth daily.    torsemide (DEMADEX) 20 MG tablet TAKE 1 TO 2 TABLETS BY MOUTH DAILY. ONLY TAKE 2 TABLETS FOR 1 TO 3 DAYS AND IF PERSISTS, CALL WITH BAD SWELLING    vitamin B-12 (CYANOCOBALAMIN) 500  MCG tablet Take 1 tablet (500 mcg total) by mouth daily.    No facility-administered encounter medications on file as of 05/06/2022.    Patient Active Problem List   Diagnosis Date Noted   Pulmonary hypertension, primary ([place]) 09/07/2021   Acute respiratory failure with hypoxia (HCC)    Shock circulatory ([place]) 08/05/2021   Pericardial effusion 08/05/2021   Acute blood loss anemia 08/04/2021   Anasarca    Stage 3b chronic kidney disease ([place]) 05/17/2021   Pleural effusion 05/17/2021   Acute on chronic diastolic CHF (congestive heart failure) ([place]) 05/17/2021   Obesity, Class  III, BMI 40-49.9 (morbid obesity) ([place]) 05/17/2021   Chronic anticoagulation 05/17/2021   Acute kidney injury superimposed on CKD ([place]) 04/11/2021   Hyponatremia 04/11/2021   Acute on chronic heart failure with preserved ejection fraction (HFpEF) ([place]) 04/11/2021   Chronic venous insufficiency 08/14/2020   PAD (peripheral artery disease) ([place]) 08/14/2020   Bilateral primary osteoarthritis of knee 04/13/2020   Type 2 diabetes mellitus ([place]) 04/13/2020   Acquired spondylolisthesis 12/17/2019   Osteoarthritis of knee 12/17/2019   Lumbar radiculopathy 12/17/2019   Atrial fibrillation ([place]) 11/20/2019   Closed fracture of radial styloid 08/06/2019   Displaced fracture of unspecified radial styloid process, initial encounter for closed fracture 08/06/2019   Controlled substance agreement signed 01/24/2018   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   Piriformis syndrome 07/01/2016   Sacroiliac joint dysfunction 01/09/2016   Joint disorder, unspecified 01/09/2016   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 diabetes mellitus ([place]) 04/28/2015   Lymphedema 03/27/2015   Edema 03/27/2015   Open-angle glaucoma, mild stage    Osteoarthritis of both knees    Type 2 diabetes mellitus with renal complication (HCC)    Osteopenia    Benign hypertensive renal disease    Hyperlipidemia    Degeneration of lumbosacral intervertebral disc     Conditions to be addressed/monitored:CHF, DMII, and CKD Stage 4  Care Plan : RNCM: General Plan of Care (Adult) for Chronic Disease Management and Care Coordination Needs  Updates made by Vanita Ingles, RN since 05/06/2022 12:00 AM     Problem: RNCM: Development of plan of care for Chronic Disease Management (HF, DM, CKD4)   Priority: High     Long-Range Goal: RNCM: Effective Management  of plan of care for Chronic Disease Management (HF, DM, CKD4)   Start Date: 03/04/2022  Expected End Date: 03/04/2022  Priority: High  Note:   Current Barriers:   Knowledge Deficits related to plan of care for management of CHF, DMII, and CKD Stage 4  Care Coordination needs related to education needs on how to use the continuous glucose meter system and DM education and support  Chronic Disease Management support and education needs related to CHF, DMII, and CKD Stage 4  RNCM Clinical Goal(s):  Patient will verbalize understanding of plan for management of CHF, DMII, and CKD Stage 4 as evidenced by keeping appointments with the provider, following the plan of care, taking medications, following dietary restrictions, and working with the CCM team to effectively manage health and well being.  demonstrate understanding of rationale for each prescribed medication as evidenced by taking medications as prescribed and calling for refills before running out of medications.     attend all scheduled medical appointments: with pcp and specialist as evidenced by keeping appointments and calling for schedule change needs         demonstrate improved and ongoing adherence to prescribed treatment plan for CHF,  DMII, and CKD Stage 4 as evidenced by lab work stable with trending down A1C level, stable VS, no exacerbations of chronic conditions.  demonstrate a decrease in CHF, DMII, and CKD Stage 4 exacerbations  as evidenced by no acute onset of chronic conditions, no unplanned hospital admits, and working with the CCM team to effectively manage health and well being.  demonstrate ongoing self health care management ability for effective management of chronic conditions  as evidenced by working with the CCM team through collaboration with Consulting civil engineer, provider, and care team.   Interventions: 1:1 collaboration with primary care provider regarding development and update of comprehensive plan of care as evidenced by provider attestation and co-signature Inter-disciplinary care team collaboration (see longitudinal plan of care) Evaluation of current treatment plan related  to  self management and patient's adherence to plan as established by provider   Heart Failure Interventions:  (Status: Goal on Track (progressing): YES.)  Long Term Goal  Wt Readings from Last 3 Encounters:  03/01/22 171 lb 6.4 oz (77.7 kg)  02/22/22 168 lb 4 oz (76.3 kg)  01/25/22 171 lb 3.2 oz (77.7 kg)   Basic overview and discussion of pathophysiology of Heart Failure reviewed Provided education on low sodium diet. 05-06-2022: The patient is compliant with heart healthy/ADA diet. The patient will have a face to face visit with the [facility] on 05-07-2022 for help with blood glucose device and educational material for chronic disease management.  Reviewed Heart Failure Action Plan in depth and provided written copy Assessed need for readable accurate scales in home. 03-07-2022: The patient has a scale and uses on a consistent basis. Provided education about placing scale on hard, flat surface Advised patient to weigh each morning after emptying bladder Discussed importance of daily weight and advised patient to weigh and record daily Reviewed role of diuretics in prevention of fluid overload and management of heart failure Discussed the importance of keeping all appointments with provider Provided patient with education about the role of exercise in the management of heart failure Advised patient to discuss changes in heart health and CHF with provider Screening for signs and symptoms of depression related to chronic disease state  Assessed social determinant of health barriers  Chronic Kidney Disease (Status: Goal on Track (progressing): YES.)  Long Term Goal  Last practice recorded BP readings:  BP Readings from Last 3 Encounters:  03/01/22 99/64  02/22/22 100/60  01/25/22 99/66  Most recent eGFR/CrCl:  Lab Results  Component Value Date   EGFR 23 (L) 03/01/2022    No components found for: CRCL  Evaluation of current treatment plan related to chronic kidney disease self management and  patient's adherence to plan as established by provider. 05-06-2022: The patient is compliant with the plan of care and sees the provider on a regular basis.  The patient will be in the office on 05-07-2022 to met with the [facility] for education and information on effective management of chronic conditions.  Provided education to patient re: stroke prevention, s/s of heart attack and stroke    Reviewed prescribed diet heart healthy/ADA diet. 03-12-2022: The patient is monitoring her dietary intake on a more consistent basis. Education and support given Reviewed medications with patient and discussed importance of compliance. 03-12-2022: The patient is compliant with medications. Denies any medication needs today.    Advised patient, providing education and rationale, to monitor blood pressure daily and record, calling PCP for findings outside established parameters    Discussed complications of poorly  controlled blood pressure such as heart disease, stroke, circulatory complications, vision complications, kidney impairment, sexual dysfunction    Advised patient to discuss changes in kidney function or urinary patterns  with provider    Discussed plans with patient for ongoing care management follow up and provided patient with direct contact information for care management team    Screening for signs and symptoms of depression related to chronic disease state      Discussed the impact of chronic kidney disease on daily life and mental health and acknowledged and normalized feelings of disempowerment, fear, and frustration    Assessed social determinant of health barriers    Provided education on kidney disease progression    Engage patient in early, proactive and ongoing discussion about goals of care and what matters most to them    Support coping and stress management by recognizing current strategies and assist in developing new strategies such as mindfulness, journaling, relaxation techniques,  problem-solving      Diabetes:  (Status: Goal on Track (progressing): YES.) Long Term Goal   Lab Results  Component Value Date   HGBA1C 11.4 (H) 03/01/2022    Assessed patient's understanding of A1c goal: <7%. 05-06-2022: The patient knows the goal of therapy and knows it is essential to get her DM under control. Will meet with the patient in the office tomorrow for education and support on the Dexcom Provided education to patient about basic DM disease process. 05-06-2022: Review and education; Reviewed medications with patient and discussed importance of medication adherence. 03-04-2022: The pcp wants to add meal time insulin coverage but also would like the patient to have a continuous glucose reader. This was ordered on 03-01-2022. Call made to [place] in [place] on 03-04-2022 to follow up on the glucose reader as the patient told the scheduler they gave her a regular meter. The pcp notified by secure chat messaging and gave the [facility] permission to give a verbal order for continuous glucose meter/ freestyle [place] or whatever the insurance would cover. The RNCM spoke to Z[place] at [facility] and he stated that someone overlooked the continuous glucose meter order and gave her a regular meter. The pcp gave the [facility] permission to give a verbal order for the [facility]. Marya Amsler received the verbal order and will process the order for the [employer]. He states the order should be completed today unless a PA is needed. The RNCM has followed up with the patient. She has not heard back from [facility]. Will touch base with the patient in the morning to see if she has obtained the [facility] and will set up a time to help her get started with monitoring. Will collaborate with the pcp and let the pcp know the current status and updates. 03-06-2022: The patient states that she called [facility] last night and they told her they were still working on her [employer]. She does not know when it will be  available. The patient states she will call the [facility] when it is available. The RNCM advised that a call would be made on Friday to touch base with the patient. Will continue to monitor for changes and set up a time when the [facility] can assist with showing the patient how to work the meter for continuous blood glucose monitoring. 03-07-2022: The patient called and left a message that [facility] said that the pharmacist told her that the insurance wanted the [facility] reader and system instead of [place]. Dr. Wynetta Emery notified and permission given  to order the patient the Dexcom continuous glucose system for the patient. Call made to [facility] and spoke to [place]. Verbal order given to [facility] for the Dexcom continuous glucose monitoring system for sensors, reader, and transmitter. Magda Paganini said it may go through or may need a PA but if it needed a PA they would put it through. Magda Paganini states the [facility] system is the preferred system for Medicare to pay for. Dr. Wynetta Emery updated that the order has been placed. Call made back to the patient and informed her that [facility] has the new information for processing the Dexcom.  The patient has the [facility] number and will call the [facility] when the system is ready to be used. Magda Paganini informed the [facility] that the Dexcom was in stock and she would process the order. 03-12-2022: The patient states that she called [facility] about 30 minutes before the [facility] called for follow up. They tell her they are still working on the order. The patient states she called Saturday also. She said it must have something to do with Medicare. The patient states that as soon as she hears from them and she has the continuous glucose system she will let the [facility] know. 05-06-2022: Incoming call from the patient letting the RNCM know she has her Dexcom meter and is ready for assistance with setting up the device. The patient will come to [facility] on 05-07-2022 and bring her Dexcom meter with her and the [facility] will assist the  patient in getting her device set up. The patient knows to bring her kit with her and will meet the [facility] at 1130 am. Will assist the patient with educational needs and recommendations ;        Reviewed prescribed diet with patient heart healthy/ADA; Counseled on importance of regular laboratory monitoring as prescribed;        Discussed plans with patient for ongoing care management follow up and provided patient with direct contact information for care management team;      Provided patient with written educational materials related to hypo and hyperglycemia and importance of correct treatment;       Reviewed scheduled/upcoming provider appointments including: 03-21-2022 at 340 pm;         Advised patient, providing education and rationale, to check cbg when you have symptoms of low or high blood sugar, before and after exercise, and continuous reader [facility] Elenor Legato ordered on 03-01-2022- RNCM called and spoke to the pharmacist on 03-04-2022- Greg with [facility] for assistance as they did not give the patient the correct meter on 03-01-2022  and record. 03-06-2022: The patient states her blood sugar this am was 197. Denies any acute findings.  03-07-2022: Call made back to [facility] and spoke to [place]. New verbal order given to [facility] for the Dexcom continuous glucose monitoring system. Dr. Wynetta Emery updated. Will continue to monitor and set up a time to work with the patient on getting the system started and maintaining.  03-12-2022: The patient states her blood sugars have been better. She states that it was down to 118 yesterday am and it was 137 this am. She is checking it one time a day. Is having a hard time getting blood to come out to test. Education on running fingers under warm water and massaging the area before sticking to see if this would help with finger sticks. Education and support provided.  call provider for findings outside established parameters;       Review of patient status, including  review of  consultants reports, relevant laboratory and other test results, and medications completed;       Advised patient to discuss blood sugar trends  with provider;       Patient Goals/Self-Care Activities: Take medications as prescribed   Attend all scheduled provider appointments Call pharmacy for medication refills 3-7 days in advance of running out of medications Attend church or other social activities Perform all self care activities independently  Perform IADL's (shopping, preparing meals, housekeeping, managing finances) independently Call provider office for new concerns or questions  Work with the social worker to address care coordination needs and will continue to work with the clinical team to address health care and disease management related needs call the Suicide and Crisis Lifeline: 988 call the [place] National Suicide Prevention Lifeline: 409 191 3380 or TTY: 947 706 6904 TTY (763)809-2197) to talk to a trained counselor call 1-800-273-TALK (toll free, 24 hour hotline) if experiencing a Mental Health or [place]  call office if I gain more than 2 pounds in one day or 5 pounds in one week keep legs up while sitting track weight in diary use salt in moderation watch for swelling in feet, ankles and legs every day weigh myself daily begin a heart failure diary bring diary to all appointments develop a rescue plan follow rescue plan if symptoms flare-up eat more whole grains, fruits and vegetables, lean meats and healthy fats know when to call the doctorfor changes in HF and worsening edema or swelling track symptoms and what helps feel better or worse dress right for the weather, hot or cold schedule appointment with eye doctor check blood sugar at prescribed times: when you have symptoms of low or high blood sugar, before and after exercise, and continuous reader ordered for the patient on 03-01-2022  check feet daily for cuts, sores or  redness enter blood sugar readings and medication or insulin into daily log take the blood sugar log to all doctor visits take the blood sugar meter to all doctor visits trim toenails straight across drink 6 to 8 glasses of water each day eat fish at least once per week fill half of plate with vegetables limit fast food meals to no more than 1 per week manage portion size prepare main meal at home 3 to 5 days each week read food labels for fat, fiber, carbohydrates and portion size reduce red meat to 2 to 3 times a week switch to sugar-free drinks keep feet up while sitting wash and dry feet carefully every day wear comfortable, cotton socks wear comfortable, well-fitting shoes       Plan:Face to Face appointment with care management team member scheduled for: 05-07-2022 at [place] am  [place], MSN, [facility] Mobile: [phone]

## 2022-05-06 NOTE — Patient Instructions (Signed)
Visit Information  Thank you for taking time to visit with me today. Please don't hesitate to contact me if I can be of assistance to you before our next scheduled telephone appointment.  Following are the goals we discussed today:  Heart Failure Interventions:  (Status: Goal on Track (progressing): YES.)  Long Term Goal     Wt Readings from Last 3 Encounters:  03/01/22 171 lb 6.4 oz (77.7 kg)  02/22/22 168 lb 4 oz (76.3 kg)  01/25/22 171 lb 3.2 oz (77.7 kg)    Basic overview and discussion of pathophysiology of Heart Failure reviewed Provided education on low sodium diet. 05-06-2022: The patient is compliant with heart healthy/ADA diet. The patient will have a face to face visit with the [facility] on 05-07-2022 for help with blood glucose device and educational material for chronic disease management.  Reviewed Heart Failure Action Plan in depth and provided written copy Assessed need for readable accurate scales in home. 03-07-2022: The patient has a scale and uses on a consistent basis. Provided education about placing scale on hard, flat surface Advised patient to weigh each morning after emptying bladder Discussed importance of daily weight and advised patient to weigh and record daily Reviewed role of diuretics in prevention of fluid overload and management of heart failure Discussed the importance of keeping all appointments with provider Provided patient with education about the role of exercise in the management of heart failure Advised patient to discuss changes in heart health and CHF with provider Screening for signs and symptoms of depression related to chronic disease state  Assessed social determinant of health barriers   Chronic Kidney Disease (Status: Goal on Track (progressing): YES.)  Long Term Goal  Last practice recorded BP readings:     BP Readings from Last 3 Encounters:  03/01/22 99/64  02/22/22 100/60  01/25/22 99/66  Most recent eGFR/CrCl:       Lab Results   Component Value Date    EGFR 23 (L) 03/01/2022    No components found for: CRCL   Evaluation of current treatment plan related to chronic kidney disease self management and patient's adherence to plan as established by provider. 05-06-2022: The patient is compliant with the plan of care and sees the provider on a regular basis.  The patient will be in the office on 05-07-2022 to met with the [facility] for education and information on effective management of chronic conditions.  Provided education to patient re: stroke prevention, s/s of heart attack and stroke    Reviewed prescribed diet heart healthy/ADA diet. 03-12-2022: The patient is monitoring her dietary intake on a more consistent basis. Education and support given Reviewed medications with patient and discussed importance of compliance. 03-12-2022: The patient is compliant with medications. Denies any medication needs today.    Advised patient, providing education and rationale, to monitor blood pressure daily and record, calling PCP for findings outside established parameters    Discussed complications of poorly controlled blood pressure such as heart disease, stroke, circulatory complications, vision complications, kidney impairment, sexual dysfunction    Advised patient to discuss changes in kidney function or urinary patterns  with provider    Discussed plans with patient for ongoing care management follow up and provided patient with direct contact information for care management team    Screening for signs and symptoms of depression related to chronic disease state      Discussed the impact of chronic kidney disease on daily life and mental health and acknowledged and normalized  feelings of disempowerment, fear, and frustration    Assessed social determinant of health barriers    Provided education on kidney disease progression    Engage patient in early, proactive and ongoing discussion about goals of care and what matters most to them     Support coping and stress management by recognizing current strategies and assist in developing new strategies such as mindfulness, journaling, relaxation techniques, problem-solving        Diabetes:  (Status: Goal on Track (progressing): YES.) Long Term Goal         Lab Results  Component Value Date    HGBA1C 11.4 (H) 03/01/2022    Assessed patient's understanding of A1c goal: <7%. 05-06-2022: The patient knows the goal of therapy and knows it is essential to get her DM under control. Will meet with the patient in the office tomorrow for education and support on the Dexcom Provided education to patient about basic DM disease process. 05-06-2022: Review and education; Reviewed medications with patient and discussed importance of medication adherence. 03-04-2022: The pcp wants to add meal time insulin coverage but also would like the patient to have a continuous glucose reader. This was ordered on 03-01-2022. Call made to Sharon in [place] on 03-04-2022 to follow up on the glucose reader as the patient told the scheduler they gave her a regular meter. The pcp notified by secure chat messaging and gave the [facility] permission to give a verbal order for continuous glucose meter/ freestyle [place] or whatever the insurance would cover. The RNCM spoke to [place] at [facility] and he stated that someone overlooked the continuous glucose meter order and gave her a regular meter. The pcp gave the [facility] permission to give a verbal order for the [facility]. Marya Amsler received the verbal order and will process the order for the [employer]. He states the order should be completed today unless a PA is needed. The RNCM has followed up with the patient. She has not heard back from [facility]. Will touch base with the patient in the morning to see if she has obtained the [facility] and will set up a time to help her get started with monitoring. Will collaborate with the pcp and let the pcp know the current status  and updates. 03-06-2022: The patient states that she called [facility] last night and they told her they were still working on her [employer]. She does not know when it will be available. The patient states she will call the [facility] when it is available. The RNCM advised that a call would be made on Friday to touch base with the patient. Will continue to monitor for changes and set up a time when the [facility] can assist with showing the patient how to work the meter for continuous blood glucose monitoring. 03-07-2022: The patient called and left a message that [facility] said that the pharmacist told her that the insurance wanted the [facility] reader and system instead of [place]. Dr. Wynetta Emery notified and permission given to order the patient the Dexcom continuous glucose system for the patient. Call made to [facility] and spoke to [place]. Verbal order given to [facility] for the Dexcom continuous glucose monitoring system for sensors, reader, and transmitter. Magda Paganini said it may go through or may need a PA but if it needed a PA they would put it through. Magda Paganini states the [facility] system is the preferred system for Medicare to pay for. Dr. Wynetta Emery updated that the order has been placed. Call made back  to the patient and informed her that [facility] has the new information for processing the Dexcom.  The patient has the [facility] number and will call the [facility] when the system is ready to be used. Magda Paganini informed the [facility] that the Dexcom was in stock and she would process the order. 03-12-2022: The patient states that she called [facility] about 30 minutes before the [facility] called for follow up. They tell her they are still working on the order. The patient states she called Saturday also. She said it must have something to do with Medicare. The patient states that as soon as she hears from them and she has the continuous glucose system she will let the [facility] know. 05-06-2022: Incoming call from the patient letting the RNCM know she has  her Dexcom meter and is ready for assistance with setting up the device. The patient will come to [facility] on 05-07-2022 and bring her Dexcom meter with her and the [facility] will assist the patient in getting her device set up. The patient knows to bring her kit with her and will meet the [facility] at 1130 am. Will assist the patient with educational needs and recommendations ;        Reviewed prescribed diet with patient heart healthy/ADA; Counseled on importance of regular laboratory monitoring as prescribed;        Discussed plans with patient for ongoing care management follow up and provided patient with direct contact information for care management team;      Provided patient with written educational materials related to hypo and hyperglycemia and importance of correct treatment;       Reviewed scheduled/upcoming provider appointments including: 03-21-2022 at 340 pm;         Advised patient, providing education and rationale, to check cbg when you have symptoms of low or high blood sugar, before and after exercise, and continuous reader [facility] Elenor Legato ordered on 03-01-2022- RNCM called and spoke to the pharmacist on 03-04-2022- Greg with [facility] for assistance as they did not give the patient the correct meter on 03-01-2022  and record. 03-06-2022: The patient states her blood sugar this am was 197. Denies any acute findings.  03-07-2022: Call made back to [facility] and spoke to [place]. New verbal order given to [facility] for the Dexcom continuous glucose monitoring system. Dr. Wynetta Emery updated. Will continue to monitor and set up a time to work with the patient on getting the system started and maintaining.  03-12-2022: The patient states her blood sugars have been better. She states that it was down to 118 yesterday am and it was 137 this am. She is checking it one time a day. Is having a hard time getting blood to come out to test. Education on running fingers under warm water and massaging the area before sticking to see  if this would help with finger sticks. Education and support provided.  call provider for findings outside established parameters;       Review of patient status, including review of consultants reports, relevant laboratory and other test results, and medications completed;       Advised patient to discuss blood sugar trends  with provider;         Our next appointment is in-person at [facility] office on 05-07-2022 at 1130 am  Please call the care guide team at [phone] if you need to cancel or reschedule your appointment.   If you are experiencing a Mental Health or [place] or need someone to talk to,  please call the Suicide and Crisis Lifeline: 988 call the [place] National Suicide Prevention Lifeline: 873-813-3185 or TTY: 438-521-1232 TTY (903)206-0298) to talk to a trained counselor call 1-800-273-TALK (toll free, 24 hour hotline)   The patient verbalized understanding of instructions, educational materials, and care plan provided today and DECLINED offer to receive copy of patient instructions, educational materials, and care plan.  Noreene Larsson RN, MSN, [facility] Mobile: [phone]

## 2022-05-07 ENCOUNTER — Ambulatory Visit: Payer: Self-pay

## 2022-05-07 ENCOUNTER — Ambulatory Visit: Payer: Medicare PPO

## 2022-05-07 DIAGNOSIS — N184 Chronic kidney disease, stage 4 (severe): Secondary | ICD-10-CM

## 2022-05-07 DIAGNOSIS — I5033 Acute on chronic diastolic (congestive) heart failure: Secondary | ICD-10-CM

## 2022-05-07 DIAGNOSIS — Z9181 History of falling: Secondary | ICD-10-CM

## 2022-05-07 DIAGNOSIS — Z794 Long term (current) use of insulin: Secondary | ICD-10-CM

## 2022-05-07 NOTE — Chronic Care Management (AMB) (Signed)
Chronic Care Management   CCM RN Visit Note  05/07/2022 Name: Karen Farley MRN: [id number] DOB: 1946/03/23  Subjective: Karen Farley is a 76 y.o. year old female who is a primary care patient of Valerie Roys, DO. The care management team was consulted for assistance with disease management and care coordination needs.    Engaged with patient face to face for follow up visit in response to provider referral for case management and/or care coordination services.   Consent to Services:  The patient was given information about Chronic Care Management services, agreed to services, and gave verbal consent prior to initiation of services.  Please see initial visit note for detailed documentation.   Patient agreed to services and verbal consent obtained.   Assessment: Review of patient past medical history, allergies, medications, health status, including review of consultants reports, laboratory and other test data, was performed as part of comprehensive evaluation and provision of chronic care management services.   SDOH (Social Determinants of Health) assessments and interventions performed:    CCM Care Plan  No Known Allergies  Outpatient Encounter Medications as of 05/07/2022  Medication Sig Note   ACCU-CHEK GUIDE test strip USE TO CHECK BLOOD SUGAR DAILY    apixaban (ELIQUIS) 5 MG TABS tablet Take 1 tablet (5 mg total) by mouth 2 (two) times daily.    Cholecalciferol 125 MCG (5000 UT) TABS Take by mouth. 12/17/2021: Once a week    Continuous Glucose Monitor Sup KIT 1 each by Does not apply route continuous.    Elastic Bandages & Supports (MEDICAL COMPRESSION STOCKINGS) MISC by Does not apply route.    empagliflozin (JARDIANCE) 25 MG TABS tablet Take 1 tablet (25 mg total) by mouth daily before breakfast.    folic acid (FOLVITE) 1 MG tablet Take 1 tablet (1 mg total) by mouth daily.    insulin glargine, 2 Unit Dial, (TOUJEO MAX SOLOSTAR) 300 UNIT/ML Solostar Pen Inject 48  Units into the skin at bedtime.    Insulin Pen Needle (PEN NEEDLES) 30G X 8 MM MISC 1 each by Does not apply route daily.    iron polysaccharides (NIFEREX) 150 MG capsule TAKE 1 CAPSULE(150 MG) BY MOUTH DAILY    latanoprost (XALATAN) 0.005 % ophthalmic solution Place 1 drop into both eyes at bedtime.    metolazone (ZAROXOLYN) 2.5 MG tablet Take 1 tablet (2.5 mg total) by mouth every Monday, Wednesday, and Friday.    metoprolol succinate (TOPROL-XL) 25 MG 24 hr tablet Take 0.5 tablets (12.5 mg total) by mouth daily.    midodrine (PROAMATINE) 10 MG tablet Take 1 tablet (10 mg total) by mouth 3 (three) times daily with meals.    Multiple Vitamin (MULTIVITAMIN WITH MINERALS) TABS tablet Take 1 tablet by mouth daily.    Niacin (VITAMIN B-3 PO) Take 5,000 Units by mouth once a week.    pantoprazole (PROTONIX) 40 MG tablet Take 1 tablet (40 mg total) by mouth 2 (two) times daily.    polyethylene glycol (MIRALAX / GLYCOLAX) 17 g packet Take 17 g by mouth daily.    potassium chloride SA (KLOR-CON) 20 MEQ tablet Take 2 tablets (40 mEq total) by mouth daily.    spironolactone (ALDACTONE) 25 MG tablet Take 1 tablet (25 mg total) by mouth daily.    torsemide (DEMADEX) 20 MG tablet TAKE 1 TO 2 TABLETS BY MOUTH DAILY. ONLY TAKE 2 TABLETS FOR 1 TO 3 DAYS AND IF PERSISTS, CALL WITH BAD SWELLING    vitamin B-12 (CYANOCOBALAMIN)  500 MCG tablet Take 1 tablet (500 mcg total) by mouth daily.    No facility-administered encounter medications on file as of 05/07/2022.    Patient Active Problem List   Diagnosis Date Noted   Pulmonary hypertension, primary ([place]) 09/07/2021   Acute respiratory failure with hypoxia (HCC)    Shock circulatory ([place]) 08/05/2021   Pericardial effusion 08/05/2021   Acute blood loss anemia 08/04/2021   Anasarca    Stage 3b chronic kidney disease ([place]) 05/17/2021   Pleural effusion 05/17/2021   Acute on chronic diastolic CHF (congestive heart failure) ([place]) 05/17/2021   Obesity, Class  III, BMI 40-49.9 (morbid obesity) ([place]) 05/17/2021   Chronic anticoagulation 05/17/2021   Acute kidney injury superimposed on CKD ([place]) 04/11/2021   Hyponatremia 04/11/2021   Acute on chronic heart failure with preserved ejection fraction (HFpEF) ([place]) 04/11/2021   Chronic venous insufficiency 08/14/2020   PAD (peripheral artery disease) ([place]) 08/14/2020   Bilateral primary osteoarthritis of knee 04/13/2020   Type 2 diabetes mellitus ([place]) 04/13/2020   Acquired spondylolisthesis 12/17/2019   Osteoarthritis of knee 12/17/2019   Lumbar radiculopathy 12/17/2019   Atrial fibrillation ([place]) 11/20/2019   Closed fracture of radial styloid 08/06/2019   Displaced fracture of unspecified radial styloid process, initial encounter for closed fracture 08/06/2019   Controlled substance agreement signed 01/24/2018   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   Piriformis syndrome 07/01/2016   Sacroiliac joint dysfunction 01/09/2016   Joint disorder, unspecified 01/09/2016   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 diabetes mellitus ([place]) 04/28/2015   Lymphedema 03/27/2015   Edema 03/27/2015   Open-angle glaucoma, mild stage    Osteoarthritis of both knees    Type 2 diabetes mellitus with renal complication (HCC)    Osteopenia    Benign hypertensive renal disease    Hyperlipidemia    Degeneration of lumbosacral intervertebral disc     Conditions to be addressed/monitored:CHF, DMII, CKD Stage 4, and falls   Care Plan : RNCM: General Plan of Care (Adult) for Chronic Disease Management and Care Coordination Needs  Updates made by Vanita Ingles, RN since 05/07/2022 12:00 AM     Problem: RNCM: Development of plan of care for Chronic Disease Management (HF, DM, CKD4)   Priority: High     Long-Range Goal: RNCM: Effective Management  of plan of care for Chronic Disease Management (HF, DM, CKD4)   Start Date: 03/04/2022  Expected End Date: 03/04/2022  Priority: High  Note:   Current Barriers:   Knowledge Deficits related to plan of care for management of CHF, DMII, and CKD Stage 4  Care Coordination needs related to education needs on how to use the continuous glucose meter system and DM education and support  Chronic Disease Management support and education needs related to CHF, DMII, and CKD Stage 4  RNCM Clinical Goal(s):  Patient will verbalize understanding of plan for management of CHF, DMII, and CKD Stage 4 as evidenced by keeping appointments with the provider, following the plan of care, taking medications, following dietary restrictions, and working with the CCM team to effectively manage health and well being.  demonstrate understanding of rationale for each prescribed medication as evidenced by taking medications as prescribed and calling for refills before running out of medications.     attend all scheduled medical appointments: with pcp and specialist as evidenced by keeping appointments and calling for schedule change needs         demonstrate improved and ongoing adherence to prescribed treatment  plan for CHF, DMII, and CKD Stage 4 as evidenced by lab work stable with trending down A1C level, stable VS, no exacerbations of chronic conditions.  demonstrate a decrease in CHF, DMII, and CKD Stage 4 exacerbations  as evidenced by no acute onset of chronic conditions, no unplanned hospital admits, and working with the CCM team to effectively manage health and well being.  demonstrate ongoing self health care management ability for effective management of chronic conditions  as evidenced by working with the CCM team through collaboration with Consulting civil engineer, provider, and care team.   Interventions: 1:1 collaboration with primary care provider regarding development and update of comprehensive plan of care as evidenced by provider attestation and co-signature Inter-disciplinary care team collaboration (see longitudinal plan of care) Evaluation of current treatment plan related  to  self management and patient's adherence to plan as established by provider   Heart Failure Interventions:  (Status: Goal on Track (progressing): YES.)  Long Term Goal  Wt Readings from Last 3 Encounters:  05/07/22 168 lb (76.2 kg)  03/01/22 171 lb 6.4 oz (77.7 kg)  02/22/22 168 lb 4 oz (76.3 kg)  05-07-2022: The patient weighed at the office and her weight was 168 pounds  Basic overview and discussion of pathophysiology of Heart Failure reviewed.  Provided education on low sodium diet. 05-06-2022: The patient is compliant with heart healthy/ADA diet. The patient will have a face to face visit with the [facility] on 05-07-2022 for help with blood glucose device and educational material for chronic disease management. 05-07-2022: The patient states she has been watching what she eats but she has been drinking more water than usual because of the humidity. The patient states she has had a little more fluid on board than usual. Education and support given. Reviewed Heart Failure Action Plan in depth and provided written copy Assessed need for readable accurate scales in home. 03-07-2022: The patient has a scale and uses on a consistent basis. 05-07-2022: The patient had to get batteries for her scale and she wanted to get a baseline reading because she felt her weight was more than it should be. Provided education about placing scale on hard, flat surface Advised patient to weigh each morning after emptying bladder Discussed importance of daily weight and advised patient to weigh and record daily. 05-07-2022: The patient is compliant with daily weights.  Reviewed role of diuretics in prevention of fluid overload and management of heart failure. 05-07-2022: Education and review  Discussed the importance of keeping all appointments with provider. 05-07-2022: Knows to follow up accordingly with the provider. Provided patient with education about the role of exercise in the management of heart failure Advised  patient to discuss changes in heart health and CHF with provider Screening for signs and symptoms of depression related to chronic disease state  Assessed social determinant of health barriers  Chronic Kidney Disease (Status: Goal on Track (progressing): YES.)  Long Term Goal  Last practice recorded BP readings:  BP Readings from Last 3 Encounters:  03/01/22 99/64  02/22/22 100/60  01/25/22 99/66  Most recent eGFR/CrCl:  Lab Results  Component Value Date   EGFR 23 (L) 03/01/2022    No components found for: CRCL  Evaluation of current treatment plan related to chronic kidney disease self management and patient's adherence to plan as established by provider. 05-06-2022: The patient is compliant with the plan of care and sees the provider on a regular basis.  The patient will be in the office  on 05-07-2022 to met with the [facility] for education and information on effective management of chronic conditions. 05-07-2022: The patient has an appointment in August with the [facility] specialist. She ask if Dr. Wynetta Emery could send a message asking for Dr. Candiss Norse to get an A1C when he does her other lab work. Will send an inbasket message to the provider and ask for help with getting A1C bloodwork.  Provided education to patient re: stroke prevention, s/s of heart attack and stroke    Reviewed prescribed diet heart healthy/ADA diet. 03-12-2022: The patient is monitoring her dietary intake on a more consistent basis. Education and support given Reviewed medications with patient and discussed importance of compliance. 05-07-2022: The patient is compliant with medications. Denies any medication needs today.    Advised patient, providing education and rationale, to monitor blood pressure daily and record, calling PCP for findings outside established parameters    Discussed complications of poorly controlled blood pressure such as heart disease, stroke, circulatory complications, vision complications, kidney  impairment, sexual dysfunction    Advised patient to discuss changes in kidney function or urinary patterns  with provider    Discussed plans with patient for ongoing care management follow up and provided patient with direct contact information for care management team    Screening for signs and symptoms of depression related to chronic disease state      Discussed the impact of chronic kidney disease on daily life and mental health and acknowledged and normalized feelings of disempowerment, fear, and frustration    Assessed social determinant of health barriers    Provided education on kidney disease progression    Engage patient in early, proactive and ongoing discussion about goals of care and what matters most to them    Support coping and stress management by recognizing current strategies and assist in developing new strategies such as mindfulness, journaling, relaxation techniques, problem-solving      Diabetes:  (Status: Goal on Track (progressing): YES.) Long Term Goal   Lab Results  Component Value Date   HGBA1C 11.4 (H) 03/01/2022    Assessed patient's understanding of A1c goal: <7%. 05-06-2022: The patient knows the goal of therapy and knows it is essential to get her DM under control. Will meet with the patient in the office tomorrow for education and support on the Dexcom. 05-07-2022: The patient accompanied by her husband came to the office today and assisted with Dexcom meter. The patient asking for blood work of A1C to be requested by pcp to kidney specialist. Will inbox Dr. Wynetta Emery for assistance and request. Provided education to patient about basic DM disease process. 05-07-2022: Review and education; Reviewed medications with patient and discussed importance of medication adherence. 03-04-2022: The pcp wants to add meal time insulin coverage but also would like the patient to have a continuous glucose reader. This was ordered on 03-01-2022. Call made to [facility] in  [place] on 03-04-2022 to follow up on the glucose reader as the patient told the scheduler they gave her a regular meter. The pcp notified by secure chat messaging and gave the [facility] permission to give a verbal order for continuous glucose meter/ freestyle [place] or whatever the insurance would cover. The RNCM spoke to [place] at [facility] and he stated that someone overlooked the continuous glucose meter order and gave her a regular meter. The pcp gave the [facility] permission to give a verbal order for the [facility]. Marya Amsler received the verbal order and will process the order for  the [employer]. He states the order should be completed today unless a PA is needed. The RNCM has followed up with the patient. She has not heard back from [facility]. Will touch base with the patient in the morning to see if she has obtained the [facility] and will set up a time to help her get started with monitoring. Will collaborate with the pcp and let the pcp know the current status and updates. 03-06-2022: The patient states that she called [facility] last night and they told her they were still working on her [employer]. She does not know when it will be available. The patient states she will call the [facility] when it is available. The RNCM advised that a call would be made on Friday to touch base with the patient. Will continue to monitor for changes and set up a time when the [facility] can assist with showing the patient how to work the meter for continuous blood glucose monitoring. 03-07-2022: The patient called and left a message that [facility] said that the pharmacist told her that the insurance wanted the [facility] reader and system instead of [place]. Dr. Wynetta Emery notified and permission given to order the patient the Dexcom continuous glucose system for the patient. Call made to [facility] and spoke to [place]. Verbal order given to [facility] for the Dexcom continuous glucose monitoring system for sensors, reader, and  transmitter. Magda Paganini said it may go through or may need a PA but if it needed a PA they would put it through. Magda Paganini states the [facility] system is the preferred system for Medicare to pay for. Dr. Wynetta Emery updated that the order has been placed. Call made back to the patient and informed her that [facility] has the new information for processing the Dexcom.  The patient has the [facility] number and will call the [facility] when the system is ready to be used. Magda Paganini informed the [facility] that the Dexcom was in stock and she would process the order. 03-12-2022: The patient states that she called [facility] about 30 minutes before the [facility] called for follow up. They tell her they are still working on the order. The patient states she called Saturday also. She said it must have something to do with Medicare. The patient states that as soon as she hears from them and she has the continuous glucose system she will let the [facility] know. 05-06-2022: Incoming call from the patient letting the RNCM know she has her Dexcom meter and is ready for assistance with setting up the device. The patient will come to [facility] on 05-07-2022 and bring her Dexcom meter with her and the [facility] will assist the patient in getting her device set up. The patient knows to bring her kit with her and will meet the [facility] at 1130 am. Will assist the patient with educational needs and recommendations. 05-07-2022: The patient accompanied by her husband came to see the [facility] face to face. The patient and husband reviewed a video about the Dexom and how to use properly. The patients sensor started and the reader set up by the [facility]. Allowed the patient and her husband to ask questions and operate the reader and how to get back to various screens to see trends and clear alerts. Also advised the patient to bring the reader to all of her MD visits for review. The husband and patient denied any questions or concerns. Confirmed with pharm D that the patient was able to place sensor on her  abdomen if needed.  Education and information provided to the patient. ;        Reviewed prescribed diet with patient heart healthy/ADA; Counseled on importance of regular laboratory monitoring as prescribed;        Discussed plans with patient for ongoing care management follow up and provided patient with direct contact information for care management team;      Provided patient with written educational materials related to hypo and hyperglycemia and importance of correct treatment;       Reviewed scheduled/upcoming provider appointments including: 03-21-2022 at 340 pm;         Advised patient, providing education and rationale, to check cbg when you have symptoms of low or high blood sugar, before and after exercise, and continuous reader [facility] ordered on 03-01-2022- RNCM called and spoke to the pharmacist on 03-04-2022- Greg with [facility] for assistance as they did not give the patient the correct meter on 03-01-2022  and record. 03-06-2022: The patient states her blood sugar this am was 197. Denies any acute findings.  03-07-2022: Call made back to [facility] and spoke to [place]. New verbal order given to [facility] for the Dexcom continuous glucose monitoring system. Dr. Wynetta Emery updated. Will continue to monitor and set up a time to work with the patient on getting the system started and maintaining.  03-12-2022: The patient states her blood sugars have been better. She states that it was down to 118 yesterday am and it was 137 this am. She is checking it one time a day. Is having a hard time getting blood to come out to test. Education on running fingers under warm water and massaging the area before sticking to see if this would help with finger sticks. Education and support provided.  call provider for findings outside established parameters;       Review of patient status, including review of consultants reports, relevant laboratory and other test results, and medications completed;       Advised  patient to discuss blood sugar trends  with provider;       Falls:  (Status: New goal.) Long Term Goal  Provided written and verbal education re: potential causes of falls and Fall prevention strategies Reviewed medications and discussed potential side effects of medications such as dizziness and frequent urination Advised patient of importance of notifying provider of falls. 05-07-2022: The patient had a fall on 04-21-2022 in the bathroom. Was evaluated for fractures as she hit her knees. Fracture to little toe Assessed for signs and symptoms of orthostatic hypotension Assessed for falls since last encounter. 05-07-2022: Last recorded fall on 04-21-2022. Education and support given Assessed patients knowledge of fall risk prevention secondary to previously provided education Advised patient to discuss falls and safety concerns  with provider   Patient Goals/Self-Care Activities: Take medications as prescribed   Attend all scheduled provider appointments Call pharmacy for medication refills 3-7 days in advance of running out of medications Attend church or other social activities Perform all self care activities independently  Perform IADL's (shopping, preparing meals, housekeeping, managing finances) independently Call provider office for new concerns or questions  Work with the social worker to address care coordination needs and will continue to work with the clinical team to address health care and disease management related needs call the Suicide and Crisis Lifeline: 988 call the [place] National Suicide Prevention Lifeline: (830)462-0240 or TTY: 9394769219 TTY 239 563 0234) to talk to a trained counselor call 1-800-273-TALK (toll free, 24 hour hotline) if experiencing a Mental Health or  Behavioral Health Crisis  call office if I gain more than 2 pounds in one day or 5 pounds in one week keep legs up while sitting track weight in diary use salt in moderation watch for swelling in feet,  ankles and legs every day weigh myself daily begin a heart failure diary bring diary to all appointments develop a rescue plan follow rescue plan if symptoms flare-up eat more whole grains, fruits and vegetables, lean meats and healthy fats know when to call the doctorfor changes in HF and worsening edema or swelling track symptoms and what helps feel better or worse dress right for the weather, hot or cold schedule appointment with eye doctor check blood sugar at prescribed times: when you have symptoms of low or high blood sugar, before and after exercise, and continuous reader ordered for the patient on 03-01-2022  check feet daily for cuts, sores or redness enter blood sugar readings and medication or insulin into daily log take the blood sugar log to all doctor visits take the blood sugar meter to all doctor visits trim toenails straight across drink 6 to 8 glasses of water each day eat fish at least once per week fill half of plate with vegetables limit fast food meals to no more than 1 per week manage portion size prepare main meal at home 3 to 5 days each week read food labels for fat, fiber, carbohydrates and portion size reduce red meat to 2 to 3 times a week switch to sugar-free drinks keep feet up while sitting wash and dry feet carefully every day wear comfortable, cotton socks wear comfortable, well-fitting shoes       Plan:Telephone follow up appointment with care management team member scheduled for:  06-07-2022 at 1 pm  [place], MSN, [facility] Mobile: [phone]

## 2022-05-07 NOTE — Patient Instructions (Signed)
Visit Information  Thank you for taking time to visit with me today. Please don't hesitate to contact me if I can be of assistance to you before our next scheduled telephone appointment.  Following are the goals we discussed today:  Heart Failure Interventions:  (Status: Goal on Track (progressing): YES.)  Long Term Goal     Wt Readings from Last 3 Encounters:  05/07/22 168 lb (76.2 kg)  03/01/22 171 lb 6.4 oz (77.7 kg)  02/22/22 168 lb 4 oz (76.3 kg)  05-07-2022: The patient weighed at the office and her weight was 168 pounds   Basic overview and discussion of pathophysiology of Heart Failure reviewed.  Provided education on low sodium diet. 05-06-2022: The patient is compliant with heart healthy/ADA diet. The patient will have a face to face visit with the [facility] on 05-07-2022 for help with blood glucose device and educational material for chronic disease management. 05-07-2022: The patient states she has been watching what she eats but she has been drinking more water than usual because of the humidity. The patient states she has had a little more fluid on board than usual. Education and support given. Reviewed Heart Failure Action Plan in depth and provided written copy Assessed need for readable accurate scales in home. 03-07-2022: The patient has a scale and uses on a consistent basis. 05-07-2022: The patient had to get batteries for her scale and she wanted to get a baseline reading because she felt her weight was more than it should be. Provided education about placing scale on hard, flat surface Advised patient to weigh each morning after emptying bladder Discussed importance of daily weight and advised patient to weigh and record daily. 05-07-2022: The patient is compliant with daily weights.  Reviewed role of diuretics in prevention of fluid overload and management of heart failure. 05-07-2022: Education and review  Discussed the importance of keeping all appointments with provider. 05-07-2022:  Knows to follow up accordingly with the provider. Provided patient with education about the role of exercise in the management of heart failure Advised patient to discuss changes in heart health and CHF with provider Screening for signs and symptoms of depression related to chronic disease state  Assessed social determinant of health barriers   Chronic Kidney Disease (Status: Goal on Track (progressing): YES.)  Long Term Goal  Last practice recorded BP readings:     BP Readings from Last 3 Encounters:  03/01/22 99/64  02/22/22 100/60  01/25/22 99/66  Most recent eGFR/CrCl:       Lab Results  Component Value Date    EGFR 23 (L) 03/01/2022    No components found for: CRCL   Evaluation of current treatment plan related to chronic kidney disease self management and patient's adherence to plan as established by provider. 05-06-2022: The patient is compliant with the plan of care and sees the provider on a regular basis.  The patient will be in the office on 05-07-2022 to met with the [facility] for education and information on effective management of chronic conditions. 05-07-2022: The patient has an appointment in August with the [facility]. She ask if Dr. Wynetta Emery could send a message asking for Dr. Candiss Norse to get an A1C when he does her other lab work. Will send an inbasket message to the provider and ask for help with getting A1C bloodwork.  Provided education to patient re: stroke prevention, s/s of heart attack and stroke    Reviewed prescribed diet heart healthy/ADA diet. 03-12-2022: The patient is  monitoring her dietary intake on a more consistent basis. Education and support given Reviewed medications with patient and discussed importance of compliance. 05-07-2022: The patient is compliant with medications. Denies any medication needs today.    Advised patient, providing education and rationale, to monitor blood pressure daily and record, calling PCP for findings outside established  parameters    Discussed complications of poorly controlled blood pressure such as heart disease, stroke, circulatory complications, vision complications, kidney impairment, sexual dysfunction    Advised patient to discuss changes in kidney function or urinary patterns  with provider    Discussed plans with patient for ongoing care management follow up and provided patient with direct contact information for care management team    Screening for signs and symptoms of depression related to chronic disease state      Discussed the impact of chronic kidney disease on daily life and mental health and acknowledged and normalized feelings of disempowerment, fear, and frustration    Assessed social determinant of health barriers    Provided education on kidney disease progression    Engage patient in early, proactive and ongoing discussion about goals of care and what matters most to them    Support coping and stress management by recognizing current strategies and assist in developing new strategies such as mindfulness, journaling, relaxation techniques, problem-solving        Diabetes:  (Status: Goal on Track (progressing): YES.) Long Term Goal         Lab Results  Component Value Date    HGBA1C 11.4 (H) 03/01/2022    Assessed patient's understanding of A1c goal: <7%. 05-06-2022: The patient knows the goal of therapy and knows it is essential to get her DM under control. Will meet with the patient in the office tomorrow for education and support on the Dexcom. 05-07-2022: The patient accompanied by her husband came to the office today and assisted with Dexcom meter. The patient asking for blood work of A1C to be requested by pcp to kidney specialist. Will inbox Dr. Wynetta Emery for assistance and request. Provided education to patient about basic DM disease process. 05-07-2022: Review and education; Reviewed medications with patient and discussed importance of medication adherence. 03-04-2022: The pcp wants to  add meal time insulin coverage but also would like the patient to have a continuous glucose reader. This was ordered on 03-01-2022. Call made to [place] in [place] on 03-04-2022 to follow up on the glucose reader as the patient told the scheduler they gave her a regular meter. The pcp notified by secure chat messaging and gave the [facility] permission to give a verbal order for continuous glucose meter/ freestyle [place] or whatever the insurance would cover. The RNCM spoke to [facility] at [facility] and he stated that someone overlooked the continuous glucose meter order and gave her a regular meter. The pcp gave the [facility] permission to give a verbal order for the [facility]. Marya Amsler received the verbal order and will process the order for the [employer]. He states the order should be completed today unless a PA is needed. The RNCM has followed up with the patient. She has not heard back from [facility]. Will touch base with the patient in the morning to see if she has obtained the [facility] and will set up a time to help her get started with monitoring. Will collaborate with the pcp and let the pcp know the current status and updates. 03-06-2022: The patient states that she called [facility] last night and  they told her they were still working on her [employer]. She does not know when it will be available. The patient states she will call the [facility] when it is available. The RNCM advised that a call would be made on Friday to touch base with the patient. Will continue to monitor for changes and set up a time when the [facility] can assist with showing the patient how to work the meter for continuous blood glucose monitoring. 03-07-2022: The patient called and left a message that [facility] said that the pharmacist told her that the insurance wanted the [facility] reader and system instead of [place]. Dr. Wynetta Emery notified and permission given to order the patient the Dexcom continuous glucose system for the  patient. Call made to [facility] and spoke to [place]. Verbal order given to [facility] for the Dexcom continuous glucose monitoring system for sensors, reader, and transmitter. Magda Paganini said it may go through or may need a PA but if it needed a PA they would put it through. Magda Paganini states the [facility] system is the preferred system for Medicare to pay for. Dr. Wynetta Emery updated that the order has been placed. Call made back to the patient and informed her that [facility] has the new information for processing the Dexcom.  The patient has the [facility] number and will call the [facility] when the system is ready to be used. Magda Paganini informed the [facility] that the Dexcom was in stock and she would process the order. 03-12-2022: The patient states that she called [facility] about 30 minutes before the [facility] called for follow up. They tell her they are still working on the order. The patient states she called Saturday also. She said it must have something to do with Medicare. The patient states that as soon as she hears from them and she has the continuous glucose system she will let the [facility] know. 05-06-2022: Incoming call from the patient letting the RNCM know she has her Dexcom meter and is ready for assistance with setting up the device. The patient will come to [facility] on 05-07-2022 and bring her Dexcom meter with her and the [facility] will assist the patient in getting her device set up. The patient knows to bring her kit with her and will meet the [facility] at 1130 am. Will assist the patient with educational needs and recommendations. 05-07-2022: The patient accompanied by her husband came to see the [facility] face to face. The patient and husband reviewed a video about the Dexom and how to use properly. The patients sensor started and the reader set up by the [facility]. Allowed the patient and her husband to ask questions and operate the reader and how to get back to various screens to see trends and clear alerts. Also advised the patient to bring the reader to all of  her MD visits for review. The husband and patient denied any questions or concerns. Confirmed with pharm D that the patient was able to place sensor on her abdomen if needed. Education and information provided to the patient. ;        Reviewed prescribed diet with patient heart healthy/ADA; Counseled on importance of regular laboratory monitoring as prescribed;        Discussed plans with patient for ongoing care management follow up and provided patient with direct contact information for care management team;      Provided patient with written educational materials related to hypo and hyperglycemia and importance of correct treatment;       Reviewed scheduled/upcoming  provider appointments including: 03-21-2022 at 340 pm;         Advised patient, providing education and rationale, to check cbg when you have symptoms of low or high blood sugar, before and after exercise, and continuous reader [facility] Elenor Legato ordered on 03-01-2022- RNCM called and spoke to the pharmacist on 03-04-2022- Greg with [facility] for assistance as they did not give the patient the correct meter on 03-01-2022  and record. 03-06-2022: The patient states her blood sugar this am was 197. Denies any acute findings.  03-07-2022: Call made back to [facility] and spoke to [place]. New verbal order given to [facility] for the Dexcom continuous glucose monitoring system. Dr. Wynetta Emery updated. Will continue to monitor and set up a time to work with the patient on getting the system started and maintaining.  03-12-2022: The patient states her blood sugars have been better. She states that it was down to 118 yesterday am and it was 137 this am. She is checking it one time a day. Is having a hard time getting blood to come out to test. Education on running fingers under warm water and massaging the area before sticking to see if this would help with finger sticks. Education and support provided.  call provider for findings outside established parameters;        Review of patient status, including review of consultants reports, relevant laboratory and other test results, and medications completed;       Advised patient to discuss blood sugar trends  with provider;        Falls:  (Status: New goal.) Long Term Goal  Provided written and verbal education re: potential causes of falls and Fall prevention strategies Reviewed medications and discussed potential side effects of medications such as dizziness and frequent urination Advised patient of importance of notifying provider of falls. 05-07-2022: The patient had a fall on 04-21-2022 in the bathroom. Was evaluated for fractures as she hit her knees. Fracture to little toe Assessed for signs and symptoms of orthostatic hypotension Assessed for falls since last encounter. 05-07-2022: Last recorded fall on 04-21-2022. Education and support given Assessed patients knowledge of fall risk prevention secondary to previously provided education Advised patient to discuss falls and safety concerns  with provider     Our next appointment is by telephone on 06-04-2022 at 1130 am  Please call the care guide team at [phone] if you need to cancel or reschedule your appointment.   If you are experiencing a Mental Health or [place] or need someone to talk to, please call the Suicide and Crisis Lifeline: 988 call the [place] National Suicide Prevention Lifeline: 786-301-0835 or TTY: 810-277-8492 TTY 769-282-7799) to talk to a trained counselor call 1-800-273-TALK (toll free, 24 hour hotline)   The patient verbalized understanding of instructions, educational materials, and care plan provided today and DECLINED offer to receive copy of patient instructions, educational materials, and care plan.   Noreene Larsson RN, MSN, [facility] Mobile: [phone]

## 2022-05-08 ENCOUNTER — Other Ambulatory Visit: Payer: Self-pay | Admitting: Family Medicine

## 2022-05-09 NOTE — Telephone Encounter (Signed)
Refilled 02/14/2022 #901 refill. Requested Prescriptions  Pending Prescriptions Disp Refills  . JARDIANCE 25 MG TABS tablet [Pharmacy Med Name: JARDIANCE 25 MG TAB] 90 tablet 1    Sig: TAKE 1 TABLET BY MOUTH ONCE DAILY BEFOREBREAKFAST     Endocrinology:  Diabetes - SGLT2 Inhibitors Failed - 05/08/2022 11:28 AM      Failed - Cr in normal range and within 360 days    Creatinine, Ser  Date Value Ref Range Status  03/01/2022 2.16 (H) 0.57 - 1.00 mg/dL Final   Creatinine, Urine  Date Value Ref Range Status  04/11/2021 139 mg/dL Final         Failed - HBA1C is between 0 and 7.9 and within 180 days    Hemoglobin A1C  Date Value Ref Range Status  06/28/2016 6.6  Final   HB A1C (BAYER DCA - WAIVED)  Date Value Ref Range Status  03/01/2022 11.4 (H) 4.8 - 5.6 % Final    Comment:             Prediabetes: 5.7 - 6.4          Diabetes: >6.4          Glycemic control for adults with diabetes: <7.0          Failed - eGFR in normal range and within 360 days    GFR calc Af Amer  Date Value Ref Range Status  06/06/2020 44 (L) >59 mL/min/1.73 Final    Comment:    **[facility] currently reports eGFR in compliance with the current**   recommendations of the [employer]. [facility] will   update reporting as new guidelines are published from the NKF-ASN   Task force.    GFR, Estimated  Date Value Ref Range Status  12/18/2021 21 (L) >60 mL/min Final    Comment:    (NOTE) Calculated using the CKD-EPI Creatinine Equation (2021)    eGFR  Date Value Ref Range Status  03/01/2022 23 (L) >59 mL/min/1.73 Final         Passed - Valid encounter within last 6 months    Recent Outpatient Visits          2 months ago Type 2 diabetes mellitus with diabetic nephropathy, with long-term current use of insulin ([place])   [place], [name], DO   2 months ago Type 2 diabetes mellitus with diabetic nephropathy, with long-term current use of insulin ([place])   [place], [name], DO   3 months ago Type 2 diabetes mellitus with diabetic nephropathy, with long-term current use of insulin ([place])   [place], [name], DO   3 months ago Type 2 diabetes mellitus with diabetic nephropathy, with long-term current use of insulin ([place])   [place], [place], DO   3 months ago Dizziness   [place], [place], DO      Future Appointments            In 3 months Gollan, Kathlene November, MD [facility]

## 2022-05-17 ENCOUNTER — Telehealth: Payer: Self-pay | Admitting: Family Medicine

## 2022-05-17 NOTE — Telephone Encounter (Signed)
Pam is out of the office until next week- is she OK putting it back on her arm and switching it next week?

## 2022-05-17 NOTE — Telephone Encounter (Signed)
Attempted to contact patient, NA LVM to call office back

## 2022-05-17 NOTE — Telephone Encounter (Signed)
Patient returned call and states her Dexcom will come off today and is not comfortable with putting it back on herself. Patient states she continues to check her sugars daily with her kit.

## 2022-05-17 NOTE — Telephone Encounter (Signed)
Will send to [facility] for when she returns next week

## 2022-05-17 NOTE — Telephone Encounter (Signed)
Copied from [place] [id number]. Topic: Appointment Scheduling - Scheduling Inquiry for Clinic >> May 17, 2022 10:09 AM Tiffany B wrote: Reason for CRM: Patient would like to try the [place] on her abdomin and needs assistance because Pam assisted her putting the device on her arm.(The device expires in a few hours and then a grace period is given if you want to leave it on longer, patient is in the grace period)

## 2022-05-17 NOTE — Telephone Encounter (Signed)
Patient attempted to reach [facility] did not receive a call back.

## 2022-05-20 ENCOUNTER — Other Ambulatory Visit: Payer: Self-pay | Admitting: Family Medicine

## 2022-05-20 DIAGNOSIS — E1121 Type 2 diabetes mellitus with diabetic nephropathy: Secondary | ICD-10-CM | POA: Diagnosis not present

## 2022-05-20 DIAGNOSIS — N184 Chronic kidney disease, stage 4 (severe): Secondary | ICD-10-CM

## 2022-05-20 DIAGNOSIS — Z794 Long term (current) use of insulin: Secondary | ICD-10-CM

## 2022-05-20 DIAGNOSIS — I5033 Acute on chronic diastolic (congestive) heart failure: Secondary | ICD-10-CM

## 2022-05-20 MED ORDER — TOUJEO MAX SOLOSTAR 300 UNIT/ML ~~LOC~~ SOPN: 48.0000 [IU] | PEN_INJECTOR | Freq: Every day | SUBCUTANEOUS | 0 refills | Status: DC

## 2022-05-20 NOTE — Telephone Encounter (Signed)
Copied from [place] [id number]. Topic: General - Other >> May 20, 2022  9:01 AM Everette C wrote: Reason for CRM: Medication Refill - Medication: insulin glargine, 2 Unit Dial, (TOUJEO MAX SOLOSTAR) 300 UNIT/ML Solostar Pen [[id number]] - the patient is currently out of medication   Has the patient contacted their pharmacy? Yes.  The patient has been directed to contact their PCP and expressly request 48 units  (Agent: If no, request that the patient contact the pharmacy for the refill. If patient does not wish to contact the pharmacy document the reason why and proceed with request.) (Agent: If yes, when and what did the pharmacy advise?)  Preferred Pharmacy (with phone number or street name): [facility] [place], [place]  [address] Phone: [phone] Fax: [phone] Hours: Not open 24 hours   Has the patient been seen for an appointment in the last year OR does the patient have an upcoming appointment? Yes.    Agent: Please be advised that RX refills may take up to 3 business days. We ask that you follow-up with your pharmacy.

## 2022-05-20 NOTE — Telephone Encounter (Signed)
Pt's husband called saying she will not have medication for tonight.  He ask that this rx be sent to the pharmacy asap.  [phone]

## 2022-05-20 NOTE — Telephone Encounter (Signed)
Karen Farley with [facility] called requesting new prescription for insulin glargine, 2 Unit Dial, (TOUJEO MAX SOLOSTAR) 300 UNIT/ML Solostar Pen that show the dose as Sig - Route: Inject 48 Units into the skin at bedtime. - . Advised caller that was the dose that I was seeing in system, but would request the rx be resent.

## 2022-05-21 ENCOUNTER — Telehealth: Payer: Self-pay | Admitting: Family Medicine

## 2022-05-21 ENCOUNTER — Telehealth: Payer: Self-pay

## 2022-05-21 NOTE — Telephone Encounter (Signed)
  Care Management   Follow Up Note   05/21/2022 Name: Karen Farley MRN: [id number] DOB: Nov 05, 1945   Referred by: Valerie Roys, DO Reason for referral : Chronic Care Management (RNCM: Call to assist with Dexcom after receiving information that the patient needed assistance with changing sensor. Unable to leave a message)   An unsuccessful telephone outreach was attempted today. The patient was referred to the case management team for assistance with care management and care coordination. The RNCM was unable to leave a message. Return call to the patient due to message received that the patient needed help with her dexcom sensor.  Follow Up Plan: The care management team will reach out to the patient again over the next 10 days.   Noreene Larsson RN, MSN, [facility] Mobile: [phone]

## 2022-05-21 NOTE — Telephone Encounter (Signed)
Medication Refill - Medication: apixaban (ELIQUIS) 5 MG TABS tablet   midodrine (PROAMATINE) 10 MG tablet  spironolactone (ALDACTONE) 25 MG tablet   Prior authorizations needed, pt will be out soon    Has the patient contacted their pharmacy? Yes.   (Agent: If no, request that the patient contact the pharmacy for the refill. If patient does not wish to contact the pharmacy document the reason why and proceed with request.) (Agent: If yes, when and what did the pharmacy advise?)  Preferred Pharmacy (with phone number or street name):  [place]  [place] [address]  Phone: [phone] Fax: [phone]   Has the patient been seen for an appointment in the last year OR does the patient have an upcoming appointment? Yes.    Agent: Please be advised that RX refills may take up to 3 business days. We ask that you follow-up with your pharmacy.

## 2022-05-22 DIAGNOSIS — N1832 Chronic kidney disease, stage 3b: Secondary | ICD-10-CM | POA: Diagnosis not present

## 2022-05-22 DIAGNOSIS — E1122 Type 2 diabetes mellitus with diabetic chronic kidney disease: Secondary | ICD-10-CM | POA: Diagnosis not present

## 2022-05-22 DIAGNOSIS — N179 Acute kidney failure, unspecified: Secondary | ICD-10-CM | POA: Diagnosis not present

## 2022-05-22 DIAGNOSIS — I10 Essential (primary) hypertension: Secondary | ICD-10-CM | POA: Diagnosis not present

## 2022-05-22 DIAGNOSIS — I890 Lymphedema, not elsewhere classified: Secondary | ICD-10-CM | POA: Diagnosis not present

## 2022-05-22 DIAGNOSIS — N184 Chronic kidney disease, stage 4 (severe): Secondary | ICD-10-CM | POA: Diagnosis not present

## 2022-05-22 DIAGNOSIS — R600 Localized edema: Secondary | ICD-10-CM | POA: Diagnosis not present

## 2022-05-22 DIAGNOSIS — E876 Hypokalemia: Secondary | ICD-10-CM | POA: Diagnosis not present

## 2022-05-22 MED ORDER — MIDODRINE HCL 10 MG PO TABS: 10.0000 mg | ORAL_TABLET | Freq: Three times a day (TID) | ORAL | 0 refills | Status: DC

## 2022-05-22 MED ORDER — APIXABAN 5 MG PO TABS: 5.0000 mg | ORAL_TABLET | Freq: Two times a day (BID) | ORAL | 0 refills | Status: DC

## 2022-05-22 MED ORDER — SPIRONOLACTONE 25 MG PO TABS: 25.0000 mg | ORAL_TABLET | Freq: Every day | ORAL | 0 refills | Status: DC

## 2022-05-22 NOTE — Telephone Encounter (Signed)
Requested medications are due for refill today.  Yes  Requested medications are on the active medications list.  yes  Last refill. Eliquis 04/18/2021 #180 3 refills, Midodrine 11/19/2021 #270 1 refill  Future visit scheduled.   No  Notes to clinic.  Eliquis rx was signed by Fritzi Mandes. [place]orine is not delegated.    Requested Prescriptions  Pending Prescriptions Disp Refills   apixaban (ELIQUIS) 5 MG TABS tablet 180 tablet 3    Sig: Take 1 tablet (5 mg total) by mouth 2 (two) times daily.     Hematology:  Anticoagulants - apixaban Failed - 05/21/2022  4:11 PM      Failed - Cr in normal range and within 360 days    Creatinine, Ser  Date Value Ref Range Status  03/01/2022 2.16 (H) 0.57 - 1.00 mg/dL Final   Creatinine, Urine  Date Value Ref Range Status  04/11/2021 139 mg/dL Final         Passed - PLT in normal range and within 360 days    Platelets  Date Value Ref Range Status  01/11/2022 398 150 - 450 x10E3/uL Final         Passed - HGB in normal range and within 360 days    Hemoglobin  Date Value Ref Range Status  01/11/2022 13.8 11.1 - 15.9 g/dL Final         Passed - HCT in normal range and within 360 days    Hematocrit  Date Value Ref Range Status  01/11/2022 45.4 34.0 - 46.6 % Final         Passed - AST in normal range and within 360 days    AST  Date Value Ref Range Status  01/11/2022 19 0 - 40 IU/L Final         Passed - ALT in normal range and within 360 days    ALT  Date Value Ref Range Status  01/11/2022 11 0 - 32 IU/L Final         Passed - Valid encounter within last 12 months    Recent Outpatient Visits           2 months ago Type 2 diabetes mellitus with diabetic nephropathy, with long-term current use of insulin ([place])   [facility] [place], [name], DO   3 months ago Type 2 diabetes mellitus with diabetic nephropathy, with long-term current use of insulin ([place])   [facility] [place], [name], DO   3 months ago Type  2 diabetes mellitus with diabetic nephropathy, with long-term current use of insulin ([place])   [facility] [place], [name], DO   3 months ago Type 2 diabetes mellitus with diabetic nephropathy, with long-term current use of insulin ([place])   [place], [name], DO   4 months ago Dizziness   [facility] [place], [place], DO       Future Appointments             In 3 months Gollan, Kathlene November, MD [place], [facility]             midodrine (PROAMATINE) 10 MG tablet 270 tablet 1    Sig: Take 1 tablet (10 mg total) by mouth 3 (three) times daily with meals.     Not Delegated - Cardiovascular: Midodrine Failed - 05/21/2022  4:11 PM      Failed - This refill cannot be delegated      Failed - Cr  in normal range and within 360 days    Creatinine, Ser  Date Value Ref Range Status  03/01/2022 2.16 (H) 0.57 - 1.00 mg/dL Final   Creatinine, Urine  Date Value Ref Range Status  04/11/2021 139 mg/dL Final         Passed - ALT in normal range and within 360 days    ALT  Date Value Ref Range Status  01/11/2022 11 0 - 32 IU/L Final         Passed - AST in normal range and within 360 days    AST  Date Value Ref Range Status  01/11/2022 19 0 - 40 IU/L Final         Passed - Last BP in normal range    BP Readings from Last 1 Encounters:  03/01/22 99/64         Passed - Valid encounter within last 12 months    Recent Outpatient Visits           2 months ago Type 2 diabetes mellitus with diabetic nephropathy, with long-term current use of insulin ([place])   [place], [name], DO   3 months ago Type 2 diabetes mellitus with diabetic nephropathy, with long-term current use of insulin ([place])   [place], [name], DO   3 months ago Type 2 diabetes mellitus with diabetic nephropathy, with long-term current use of insulin ([place])   [place], [name], DO   3  months ago Type 2 diabetes mellitus with diabetic nephropathy, with long-term current use of insulin ([place])   [place], [place], DO   4 months ago Dizziness   [facility] [place], [place], DO       Future Appointments             In 3 months Gollan, Kathlene November, MD [place], [facility]             spironolactone (ALDACTONE) 25 MG tablet 90 tablet 1    Sig: Take 1 tablet (25 mg total) by mouth daily.     Cardiovascular: Diuretics - Aldosterone Antagonist Failed - 05/21/2022  4:11 PM      Failed - Cr in normal range and within 180 days    Creatinine, Ser  Date Value Ref Range Status  03/01/2022 2.16 (H) 0.57 - 1.00 mg/dL Final   Creatinine, Urine  Date Value Ref Range Status  04/11/2021 139 mg/dL Final         Failed - K in normal range and within 180 days    Potassium  Date Value Ref Range Status  03/01/2022 3.4 (L) 3.5 - 5.2 mmol/L Final         Failed - eGFR is 30 or above and within 180 days    GFR calc Af Amer  Date Value Ref Range Status  06/06/2020 44 (L) >59 mL/min/1.73 Final    Comment:    **[facility] currently reports eGFR in compliance with the current**   recommendations of the [employer]. [facility] will   update reporting as new guidelines are published from the NKF-ASN   Task force.    GFR, Estimated  Date Value Ref Range Status  12/18/2021 21 (L) >60 mL/min Final    Comment:    (NOTE) Calculated using the CKD-EPI Creatinine Equation (2021)    eGFR  Date Value Ref Range Status  03/01/2022 23 (L) >59 mL/min/1.73 Final  Passed - Na in normal range and within 180 days    Sodium  Date Value Ref Range Status  03/01/2022 137 134 - 144 mmol/L Final         Passed - Last BP in normal range    BP Readings from Last 1 Encounters:  03/01/22 99/64         Passed - Valid encounter within last 6 months    Recent Outpatient Visits           2 months ago Type 2 diabetes  mellitus with diabetic nephropathy, with long-term current use of insulin ([place])   [place], [name], DO   3 months ago Type 2 diabetes mellitus with diabetic nephropathy, with long-term current use of insulin ([place])   [place], [name], DO   3 months ago Type 2 diabetes mellitus with diabetic nephropathy, with long-term current use of insulin ([place])   [place], [name], DO   3 months ago Type 2 diabetes mellitus with diabetic nephropathy, with long-term current use of insulin ([place])   [place], [place], DO   4 months ago Dizziness   [place], [place], DO       Future Appointments             In 3 months Gollan, Kathlene November, MD [facility], [facility]

## 2022-05-23 ENCOUNTER — Ambulatory Visit (INDEPENDENT_AMBULATORY_CARE_PROVIDER_SITE_OTHER): Payer: Medicare PPO

## 2022-05-23 ENCOUNTER — Telehealth: Payer: Self-pay

## 2022-05-23 ENCOUNTER — Other Ambulatory Visit: Payer: Self-pay | Admitting: Family Medicine

## 2022-05-23 DIAGNOSIS — N184 Chronic kidney disease, stage 4 (severe): Secondary | ICD-10-CM

## 2022-05-23 DIAGNOSIS — E1121 Type 2 diabetes mellitus with diabetic nephropathy: Secondary | ICD-10-CM

## 2022-05-23 NOTE — Patient Instructions (Signed)
Visit Information  Thank you for taking time to visit with me today. Please don't hesitate to contact me if I can be of assistance to you before our next scheduled telephone appointment.  Following are the goals we discussed today:  Chronic Kidney Disease (Status: Goal on Track (progressing): YES.)  Long Term Goal  Last practice recorded BP readings:     BP Readings from Last 3 Encounters:  03/01/22 99/64  02/22/22 100/60  01/25/22 99/66  Most recent eGFR/CrCl:       Lab Results  Component Value Date    EGFR 23 (L) 03/01/2022    No components found for: CRCL   Evaluation of current treatment plan related to chronic kidney disease self management and patient's adherence to plan as established by provider. 05-06-2022: The patient is compliant with the plan of care and sees the provider on a regular basis.  The patient will be in the office on 05-07-2022 to met with the [facility] for education and information on effective management of chronic conditions. 05-07-2022: The patient has an appointment in August with the [facility] specialist. She ask if Dr. Wynetta Emery could send a message asking for Dr. Candiss Norse to get an A1C when he does her other lab work. Will send an inbasket message to the provider and ask for help with getting A1C bloodwork. 05-23-2022: The patient has appointments next week with specialist. She denies any new concerns about CKD but will discuss the plan of care with the specialist at her appointment.  Provided education to patient re: stroke prevention, s/s of heart attack and stroke    Reviewed prescribed diet heart healthy/ADA diet. 05-23-2022: The patient is monitoring her dietary intake on a more consistent basis. Education and support given Reviewed medications with patient and discussed importance of compliance. 05-23-2022: The patient is compliant with medications. Denies any medication needs today.    Advised patient, providing education and rationale, to monitor blood pressure daily  and record, calling PCP for findings outside established parameters    Discussed complications of poorly controlled blood pressure such as heart disease, stroke, circulatory complications, vision complications, kidney impairment, sexual dysfunction    Advised patient to discuss changes in kidney function or urinary patterns  with provider    Discussed plans with patient for ongoing care management follow up and provided patient with direct contact information for care management team    Screening for signs and symptoms of depression related to chronic disease state      Discussed the impact of chronic kidney disease on daily life and mental health and acknowledged and normalized feelings of disempowerment, fear, and frustration    Assessed social determinant of health barriers    Provided education on kidney disease progression    Engage patient in early, proactive and ongoing discussion about goals of care and what matters most to them    Support coping and stress management by recognizing current strategies and assist in developing new strategies such as mindfulness, journaling, relaxation techniques, problem-solving        Diabetes:  (Status: Goal on Track (progressing): YES.) Long Term Goal         Lab Results  Component Value Date    HGBA1C 11.4 (H) 03/01/2022    Assessed patient's understanding of A1c goal: <7%. 05-06-2022: The patient knows the goal of therapy and knows it is essential to get her DM under control. Will meet with the patient in the office tomorrow for education and support on the Dexcom. 05-07-2022:  The patient accompanied by her husband came to the office today and assisted with Dexcom meter. The patient asking for blood work of A1C to be requested by pcp to kidney specialist. Will inbox Dr. Wynetta Emery for assistance and request. 05-23-2022: The patient has removed the sensor on her arm but is still uncomfortable with placing a new sensor on her abdomen. Wishes to have another  demonstration with the [facility] on how to place the sensor on her abdomen. Education by phone but the patient requested to have a face to face visit with the RNCM. Face to face visit scheduled for 05-28-2022 at 1 pm for demonstration on placing sensor and restarting readings. Will do extensive education with the patient so she will feel comfortable with changing out sensor. May illicit pharmacy help also if the patient continues to struggle with sensor placement. Will collaborate with pharm D for ideas or suggestions.  Provided education to patient about basic DM disease process. 05-07-2022: Review and education; Reviewed medications with patient and discussed importance of medication adherence. 03-04-2022: The pcp wants to add meal time insulin coverage but also would like the patient to have a continuous glucose reader. This was ordered on 03-01-2022. Call made to [facility] in [place] on 03-04-2022 to follow up on the glucose reader as the patient told the scheduler they gave her a regular meter. The pcp notified by secure chat messaging and gave the [facility] permission to give a verbal order for continuous glucose meter/ freestyle [place] or whatever the insurance would cover. The RNCM spoke to [place] at [facility] and he stated that someone overlooked the continuous glucose meter order and gave her a regular meter. The pcp gave the [facility] permission to give a verbal order for the [facility]. Marya Amsler received the verbal order and will process the order for the [employer]. He states the order should be completed today unless a PA is needed. The RNCM has followed up with the patient. She has not heard back from [facility]. Will touch base with the patient in the morning to see if she has obtained the [facility] and will set up a time to help her get started with monitoring. Will collaborate with the pcp and let the pcp know the current status and updates. 03-06-2022: The patient states that she called [facility] last  night and they told her they were still working on her [employer]. She does not know when it will be available. The patient states she will call the [facility] when it is available. The RNCM advised that a call would be made on Friday to touch base with the patient. Will continue to monitor for changes and set up a time when the [facility] can assist with showing the patient how to work the meter for continuous blood glucose monitoring. 03-07-2022: The patient called and left a message that [facility] said that the pharmacist told her that the insurance wanted the [facility] reader and system instead of [place]. Dr. Wynetta Emery notified and permission given to order the patient the Dexcom continuous glucose system for the patient. Call made to [facility] and spoke to [place]. Verbal order given to [facility] for the Dexcom continuous glucose monitoring system for sensors, reader, and transmitter. Magda Paganini said it may go through or may need a PA but if it needed a PA they would put it through. Magda Paganini states the [facility] system is the preferred system for Medicare to pay for. Dr. Wynetta Emery updated that the order has been placed. Call made back  to the patient and informed her that [facility] has the new information for processing the Dexcom.  The patient has the [facility] number and will call the [facility] when the system is ready to be used. Magda Paganini informed the [facility] that the Dexcom was in stock and she would process the order. 03-12-2022: The patient states that she called [facility] about 30 minutes before the [facility] called for follow up. They tell her they are still working on the order. The patient states she called Saturday also. She said it must have something to do with Medicare. The patient states that as soon as she hears from them and she has the continuous glucose system she will let the [facility] know. 05-06-2022: Incoming call from the patient letting the RNCM know she has her Dexcom meter and is ready for assistance with setting up the device.  The patient will come to [facility] on 05-07-2022 and bring her Dexcom meter with her and the [facility] will assist the patient in getting her device set up. The patient knows to bring her kit with her and will meet the [facility] at 1130 am. Will assist the patient with educational needs and recommendations. 05-07-2022: The patient accompanied by her husband came to see the [facility] face to face. The patient and husband reviewed a video about the Dexom and how to use properly. The patients sensor started and the reader set up by the [facility]. Allowed the patient and her husband to ask questions and operate the reader and how to get back to various screens to see trends and clear alerts. Also advised the patient to bring the reader to all of her MD visits for review. The husband and patient denied any questions or concerns. Confirmed with pharm D that the patient was able to place sensor on her abdomen if needed. Education and information provided to the patient. 05-23-2022: Will see the patient face to face in the office on 05-28-2022 for additional education and assistance with placement of the sensor on her abdomen. Will reach out and collaborate with the pharm D on any other recommendations for successful understanding of sensor placement and confidence in placing sensor;        Reviewed prescribed diet with patient heart healthy/ADA; Counseled on importance of regular laboratory monitoring as prescribed;        Discussed plans with patient for ongoing care management follow up and provided patient with direct contact information for care management team;      Provided patient with written educational materials related to hypo and hyperglycemia and importance of correct treatment;       Reviewed scheduled/upcoming provider appointments including: 03-21-2022 at 340 pm;         Advised patient, providing education and rationale, to check cbg when you have symptoms of low or high blood sugar, before and after exercise, and continuous reader  [facility] Elenor Legato ordered on 03-01-2022- RNCM called and spoke to the pharmacist on 03-04-2022- Greg with [facility] for assistance as they did not give the patient the correct meter on 03-01-2022  and record. 03-06-2022: The patient states her blood sugar this am was 197. Denies any acute findings.  03-07-2022: Call made back to [facility] and spoke to [place]. New verbal order given to [facility] for the Dexcom continuous glucose monitoring system. Dr. Wynetta Emery updated. Will continue to monitor and set up a time to work with the patient on getting the system started and maintaining.  03-12-2022: The patient states her blood sugars have been better. She  states that it was down to 118 yesterday am and it was 137 this am. She is checking it one time a day. Is having a hard time getting blood to come out to test. Education on running fingers under warm water and massaging the area before sticking to see if this would help with finger sticks. Education and support provided. 05-23-2022: The patient is currently doing finger sticks until she gets new Dexcom sensor placed.  call provider for findings outside established parameters;       Review of patient status, including review of consultants reports, relevant laboratory and other test results, and medications completed;       Advised patient to discuss blood sugar trends  with provider;       Our next appointment is in-person at [facility] office on 05-28-2022 at 1 pm  Please call the care guide team at [phone] if you need to cancel or reschedule your appointment.   If you are experiencing a Mental Health or [place] or need someone to talk to, please call the Suicide and Crisis Lifeline: 988 call the [place] National Suicide Prevention Lifeline: (561)090-5300 or TTY: (952)116-0105 TTY (430) 037-1646) to talk to a trained counselor call 1-800-273-TALK (toll free, 24 hour hotline)   The patient verbalized understanding of instructions, educational materials, and  care plan provided today and DECLINED offer to receive copy of patient instructions, educational materials, and care plan.   Noreene Larsson RN, MSN, [facility] Mobile: [phone]

## 2022-05-23 NOTE — Telephone Encounter (Signed)
  Care Management   Follow Up Note   05/23/2022 Name: Karen Farley MRN: [id number] DOB: 18-Sep-1946   Referred by: Valerie Roys, DO Reason for referral : Chronic Care Management (RNCM: 2nd attempt to reach the patient to assist with Dexcom sensor placement.)   A second unsuccessful telephone outreach was attempted today. The patient was referred to the case management team for assistance with care management and care coordination.   Follow Up Plan: The care management team will reach out to the patient again over the next 5 days.   Noreene Larsson RN, MSN, [facility] Mobile: [phone]

## 2022-05-23 NOTE — Telephone Encounter (Signed)
Patient scheduled appointment with PCP on 06/07/2022

## 2022-05-23 NOTE — Chronic Care Management (AMB) (Signed)
Chronic Care Management   CCM RN Visit Note  05/23/2022 Name: Karen Farley MRN: [id number] DOB: 07/12/1946  Subjective: Karen Farley is a 76 y.o. year old female who is a primary care patient of Johnson, [name], DO. The care management team was consulted for assistance with disease management and care coordination needs.    Engaged with patient by telephone for follow up visit in response to provider referral for case management and/or care coordination services.   Consent to Services:  The patient was given information about Chronic Care Management services, agreed to services, and gave verbal consent prior to initiation of services.  Please see initial visit note for detailed documentation.   Patient agreed to services and verbal consent obtained.   Assessment: Review of patient past medical history, allergies, medications, health status, including review of consultants reports, laboratory and other test data, was performed as part of comprehensive evaluation and provision of chronic care management services.   SDOH (Social Determinants of Health) assessments and interventions performed:  SDOH Interventions    Flowsheet Row Most Recent Value  SDOH Interventions   Physical Activity Interventions Intervention Not Indicated  [tries to stay as active as possible, uses a cane when walking]  Transportation Interventions Intervention Not Indicated        CCM Care Plan  No Known Allergies  Outpatient Encounter Medications as of 05/23/2022  Medication Sig Note   ACCU-CHEK GUIDE test strip USE TO CHECK BLOOD SUGAR DAILY    apixaban (ELIQUIS) 5 MG TABS tablet Take 1 tablet (5 mg total) by mouth 2 (two) times daily.    Cholecalciferol 125 MCG (5000 UT) TABS Take by mouth. 12/17/2021: Once a week    Continuous Glucose Monitor Sup KIT 1 each by Does not apply route continuous.    Elastic Bandages & Supports (MEDICAL COMPRESSION STOCKINGS) MISC by Does not apply route.     empagliflozin (JARDIANCE) 25 MG TABS tablet Take 1 tablet (25 mg total) by mouth daily before breakfast.    folic acid (FOLVITE) 1 MG tablet Take 1 tablet (1 mg total) by mouth daily.    insulin glargine, 2 Unit Dial, (TOUJEO MAX SOLOSTAR) 300 UNIT/ML Solostar Pen Inject 48 Units into the skin at bedtime.    Insulin Pen Needle (PEN NEEDLES) 30G X 8 MM MISC 1 each by Does not apply route daily.    iron polysaccharides (NIFEREX) 150 MG capsule TAKE 1 CAPSULE(150 MG) BY MOUTH DAILY    latanoprost (XALATAN) 0.005 % ophthalmic solution Place 1 drop into both eyes at bedtime.    metolazone (ZAROXOLYN) 2.5 MG tablet Take 1 tablet (2.5 mg total) by mouth every Monday, Wednesday, and Friday.    metoprolol succinate (TOPROL-XL) 25 MG 24 hr tablet Take 0.5 tablets (12.5 mg total) by mouth daily.    midodrine (PROAMATINE) 10 MG tablet Take 1 tablet (10 mg total) by mouth 3 (three) times daily with meals.    Multiple Vitamin (MULTIVITAMIN WITH MINERALS) TABS tablet Take 1 tablet by mouth daily.    Niacin (VITAMIN B-3 PO) Take 5,000 Units by mouth once a week.    pantoprazole (PROTONIX) 40 MG tablet Take 1 tablet (40 mg total) by mouth 2 (two) times daily.    polyethylene glycol (MIRALAX / GLYCOLAX) 17 g packet Take 17 g by mouth daily.    potassium chloride SA (KLOR-CON) 20 MEQ tablet Take 2 tablets (40 mEq total) by mouth daily.    spironolactone (ALDACTONE) 25 MG tablet Take 1 tablet (  25 mg total) by mouth daily.    torsemide (DEMADEX) 20 MG tablet TAKE 1 TO 2 TABLETS BY MOUTH DAILY. ONLY TAKE 2 TABLETS FOR 1 TO 3 DAYS AND IF PERSISTS, CALL WITH BAD SWELLING    vitamin B-12 (CYANOCOBALAMIN) 500 MCG tablet Take 1 tablet (500 mcg total) by mouth daily.    No facility-administered encounter medications on file as of 05/23/2022.    Patient Active Problem List   Diagnosis Date Noted   Pulmonary hypertension, primary ([place]) 09/07/2021   Acute respiratory failure with hypoxia (HCC)    Shock circulatory ([place])  08/05/2021   Pericardial effusion 08/05/2021   Acute blood loss anemia 08/04/2021   Anasarca    Stage 3b chronic kidney disease ([place]) 05/17/2021   Pleural effusion 05/17/2021   Acute on chronic diastolic CHF (congestive heart failure) ([place]) 05/17/2021   Obesity, Class III, BMI 40-49.9 (morbid obesity) ([place]) 05/17/2021   Chronic anticoagulation 05/17/2021   Acute kidney injury superimposed on CKD ([place]) 04/11/2021   Hyponatremia 04/11/2021   Acute on chronic heart failure with preserved ejection fraction (HFpEF) ([place]) 04/11/2021   Chronic venous insufficiency 08/14/2020   PAD (peripheral artery disease) ([place]) 08/14/2020   Bilateral primary osteoarthritis of knee 04/13/2020   Type 2 diabetes mellitus ([place]) 04/13/2020   Acquired spondylolisthesis 12/17/2019   Osteoarthritis of knee 12/17/2019   Lumbar radiculopathy 12/17/2019   Atrial fibrillation ([place]) 11/20/2019   Closed fracture of radial styloid 08/06/2019   Displaced fracture of unspecified radial styloid process, initial encounter for closed fracture 08/06/2019   Controlled substance agreement signed 01/24/2018   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   Piriformis syndrome 07/01/2016   Sacroiliac joint dysfunction 01/09/2016   Joint disorder, unspecified 01/09/2016   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 diabetes mellitus ([place]) 04/28/2015   Lymphedema 03/27/2015   Edema 03/27/2015   Open-angle glaucoma, mild stage    Osteoarthritis of both knees    Type 2 diabetes mellitus with renal complication (HCC)    Osteopenia    Benign hypertensive renal disease    Hyperlipidemia    Degeneration of lumbosacral intervertebral disc     Conditions to be addressed/monitored:DMII and CKD Stage 4  Care Plan : RNCM: General Plan of Care (Adult) for Chronic Disease Management and Care Coordination Needs  Updates made by Vanita Ingles, RN since 05/23/2022 12:00 AM     Problem: RNCM: Development of plan of care for Chronic Disease  Management (HF, DM, CKD4)   Priority: High     Long-Range Goal: RNCM: Effective Management  of plan of care for Chronic Disease Management (HF, DM, CKD4)   Start Date: 03/04/2022  Expected End Date: 03/04/2022  Priority: High  Note:   Current Barriers:  Knowledge Deficits related to plan of care for management of CHF, DMII, and CKD Stage 4  Care Coordination needs related to education needs on how to use the continuous glucose meter system and DM education and support  Chronic Disease Management support and education needs related to CHF, DMII, and CKD Stage 4  RNCM Clinical Goal(s):  Patient will verbalize understanding of plan for management of CHF, DMII, and CKD Stage 4 as evidenced by keeping appointments with the provider, following the plan of care, taking medications, following dietary restrictions, and working with the CCM team to effectively manage health and well being.  demonstrate understanding of rationale for each prescribed medication as evidenced by taking medications as prescribed and calling for refills before running out of  medications.     attend all scheduled medical appointments: with pcp and specialist as evidenced by keeping appointments and calling for schedule change needs         demonstrate improved and ongoing adherence to prescribed treatment plan for CHF, DMII, and CKD Stage 4 as evidenced by lab work stable with trending down A1C level, stable VS, no exacerbations of chronic conditions.  demonstrate a decrease in CHF, DMII, and CKD Stage 4 exacerbations  as evidenced by no acute onset of chronic conditions, no unplanned hospital admits, and working with the CCM team to effectively manage health and well being.  demonstrate ongoing self health care management ability for effective management of chronic conditions  as evidenced by working with the CCM team through collaboration with RN Care manager, provider, and care team.   Interventions: 1:1 collaboration with  primary care provider regarding development and update of comprehensive plan of care as evidenced by provider attestation and co-signature Inter-disciplinary care team collaboration (see longitudinal plan of care) Evaluation of current treatment plan related to  self management and patient's adherence to plan as established by provider   Heart Failure Interventions:  (Status: Goal on Track (progressing): YES.)  Long Term Goal  Wt Readings from Last 3 Encounters:  05/07/22 168 lb (76.2 kg)  03/01/22 171 lb 6.4 oz (77.7 kg)  02/22/22 168 lb 4 oz (76.3 kg)  05-07-2022: The patient weighed at the office and her weight was 168 pounds  Basic overview and discussion of pathophysiology of Heart Failure reviewed.  Provided education on low sodium diet. 05-06-2022: The patient is compliant with heart healthy/ADA diet. The patient will have a face to face visit with the RNCM on 05-07-2022 for help with blood glucose device and educational material for chronic disease management. 05-07-2022: The patient states she has been watching what she eats but she has been drinking more water than usual because of the humidity. The patient states she has had a little more fluid on board than usual. Education and support given. Reviewed Heart Failure Action Plan in depth and provided written copy Assessed need for readable accurate scales in home. 03-07-2022: The patient has a scale and uses on a consistent basis. 05-07-2022: The patient had to get batteries for her scale and she wanted to get a baseline reading because she felt her weight was more than it should be. Provided education about placing scale on hard, flat surface Advised patient to weigh each morning after emptying bladder Discussed importance of daily weight and advised patient to weigh and record daily. 05-07-2022: The patient is compliant with daily weights.  Reviewed role of diuretics in prevention of fluid overload and management of heart failure. 05-07-2022:  Education and review  Discussed the importance of keeping all appointments with provider. 05-07-2022: Knows to follow up accordingly with the provider. Provided patient with education about the role of exercise in the management of heart failure Advised patient to discuss changes in heart health and CHF with provider Screening for signs and symptoms of depression related to chronic disease state  Assessed social determinant of health barriers  Chronic Kidney Disease (Status: Goal on Track (progressing): YES.)  Long Term Goal  Last practice recorded BP readings:  BP Readings from Last 3 Encounters:  03/01/22 99/64  02/22/22 100/60  01/25/22 99/66  Most recent eGFR/CrCl:  Lab Results  Component Value Date   EGFR 23 (L) 03/01/2022    No components found for: CRCL  Evaluation of current treatment plan related   to chronic kidney disease self management and patient's adherence to plan as established by provider. 05-06-2022: The patient is compliant with the plan of care and sees the provider on a regular basis.  The patient will be in the office on 05-07-2022 to met with the [facility] for education and information on effective management of chronic conditions. 05-07-2022: The patient has an appointment in August with the [facility] specialist. She ask if Dr. Wynetta Emery could send a message asking for Dr. Candiss Norse to get an A1C when he does her other lab work. Will send an inbasket message to the provider and ask for help with getting A1C bloodwork. 05-23-2022: The patient has appointments next week with specialist. She denies any new concerns about CKD but will discuss the plan of care with the specialist at her appointment.  Provided education to patient re: stroke prevention, s/s of heart attack and stroke    Reviewed prescribed diet heart healthy/ADA diet. 05-23-2022: The patient is monitoring her dietary intake on a more consistent basis. Education and support given Reviewed medications with patient and  discussed importance of compliance. 05-23-2022: The patient is compliant with medications. Denies any medication needs today.    Advised patient, providing education and rationale, to monitor blood pressure daily and record, calling PCP for findings outside established parameters    Discussed complications of poorly controlled blood pressure such as heart disease, stroke, circulatory complications, vision complications, kidney impairment, sexual dysfunction    Advised patient to discuss changes in kidney function or urinary patterns  with provider    Discussed plans with patient for ongoing care management follow up and provided patient with direct contact information for care management team    Screening for signs and symptoms of depression related to chronic disease state      Discussed the impact of chronic kidney disease on daily life and mental health and acknowledged and normalized feelings of disempowerment, fear, and frustration    Assessed social determinant of health barriers    Provided education on kidney disease progression    Engage patient in early, proactive and ongoing discussion about goals of care and what matters most to them    Support coping and stress management by recognizing current strategies and assist in developing new strategies such as mindfulness, journaling, relaxation techniques, problem-solving      Diabetes:  (Status: Goal on Track (progressing): YES.) Long Term Goal   Lab Results  Component Value Date   HGBA1C 11.4 (H) 03/01/2022    Assessed patient's understanding of A1c goal: <7%. 05-06-2022: The patient knows the goal of therapy and knows it is essential to get her DM under control. Will meet with the patient in the office tomorrow for education and support on the Dexcom. 05-07-2022: The patient accompanied by her husband came to the office today and assisted with Dexcom meter. The patient asking for blood work of A1C to be requested by pcp to kidney specialist.  Will inbox Dr. Wynetta Emery for assistance and request. 05-23-2022: The patient has removed the sensor on her arm but is still uncomfortable with placing a new sensor on her abdomen. Wishes to have another demonstration with the [facility] on how to place the sensor on her abdomen. Education by phone but the patient requested to have a face to face visit with the RNCM. Face to face visit scheduled for 05-28-2022 at 1 pm for demonstration on placing sensor and restarting readings. Will do extensive education with the patient so she will feel comfortable with  changing out sensor. May illicit pharmacy help also if the patient continues to struggle with sensor placement. Will collaborate with pharm D for ideas or suggestions.  Provided education to patient about basic DM disease process. 05-07-2022: Review and education; Reviewed medications with patient and discussed importance of medication adherence. 03-04-2022: The pcp wants to add meal time insulin coverage but also would like the patient to have a continuous glucose reader. This was ordered on 03-01-2022. Call made to [facility] pharmacy in [place] on 03-04-2022 to follow up on the glucose reader as the patient told the scheduler they gave her a regular meter. The pcp notified by secure chat messaging and gave the RNCM permission to give a verbal order for continuous glucose meter/ freestyle Libre or whatever the insurance would cover. The RNCM spoke to Greg at [facility] and he stated that someone overlooked the continuous glucose meter order and gave her a regular meter. The pcp gave the RNCM permission to give a verbal order for the Freestyle Libre. Greg received the verbal order and will process the order for the Freestyle Libre. He states the order should be completed today unless a PA is needed. The RNCM has followed up with the patient. She has not heard back from [facility]. Will touch base with the patient in the morning to see if she has obtained the Freestyle Libre and will  set up a time to help her get started with monitoring. Will collaborate with the pcp and let the pcp know the current status and updates. 03-06-2022: The patient states that she called [facility] last night and they told her they were still working on her Freestyle Libre. She does not know when it will be available. The patient states she will call the RNCM when it is available. The RNCM advised that a call would be made on Friday to touch base with the patient. Will continue to monitor for changes and set up a time when the RNCM can assist with showing the patient how to work the meter for continuous blood glucose monitoring. 03-07-2022: The patient called and left a message that [facility] said that the pharmacist told her that the insurance wanted the Dexcom reader and system instead of Freestyle Libre. Dr. Johnson notified and permission given to order the patient the Dexcom continuous glucose system for the patient. Call made to [facility] and spoke to Leslie. Verbal order given to Leslie for the Dexcom continuous glucose monitoring system for sensors, reader, and transmitter. Leslie said it may go through or may need a PA but if it needed a PA they would put it through. Leslie states the Dexcom system is the preferred system for Medicare to pay for. Dr. Johnson updated that the order has been placed. Call made back to the patient and informed her that [facility] has the new information for processing the Dexcom.  The patient has the RNCM number and will call the RNCM when the system is ready to be used. Leslie informed the RNCM that the Dexcom was in stock and she would process the order. 03-12-2022: The patient states that she called [facility] about 30 minutes before the RNCM called for follow up. They tell her they are still working on the order. The patient states she called Saturday also. She said it must have something to do with Medicare. The patient states that as soon as she hears from them and she has the  continuous glucose system she will let the RNCM know. 05-06-2022: Incoming call from the patient   letting the RNCM know she has her Dexcom meter and is ready for assistance with setting up the device. The patient will come to [facility] on 05-07-2022 and bring her Dexcom meter with her and the [facility] will assist the patient in getting her device set up. The patient knows to bring her kit with her and will meet the [facility] at 1130 am. Will assist the patient with educational needs and recommendations. 05-07-2022: The patient accompanied by her husband came to see the [facility] face to face. The patient and husband reviewed a video about the Dexom and how to use properly. The patients sensor started and the reader set up by the [facility]. Allowed the patient and her husband to ask questions and operate the reader and how to get back to various screens to see trends and clear alerts. Also advised the patient to bring the reader to all of her MD visits for review. The husband and patient denied any questions or concerns. Confirmed with pharm D that the patient was able to place sensor on her abdomen if needed. Education and information provided to the patient. 05-23-2022: Will see the patient face to face in the office on 05-28-2022 for additional education and assistance with placement of the sensor on her abdomen. Will reach out and collaborate with the pharm D on any other recommendations for successful understanding of sensor placement and confidence in placing sensor;        Reviewed prescribed diet with patient heart healthy/ADA; Counseled on importance of regular laboratory monitoring as prescribed;        Discussed plans with patient for ongoing care management follow up and provided patient with direct contact information for care management team;      Provided patient with written educational materials related to hypo and hyperglycemia and importance of correct treatment;       Reviewed scheduled/upcoming provider appointments  including: 03-21-2022 at 340 pm;         Advised patient, providing education and rationale, to check cbg when you have symptoms of low or high blood sugar, before and after exercise, and continuous reader [facility] Elenor Legato ordered on 03-01-2022- RNCM called and spoke to the pharmacist on 03-04-2022- Greg with [facility] for assistance as they did not give the patient the correct meter on 03-01-2022  and record. 03-06-2022: The patient states her blood sugar this am was 197. Denies any acute findings.  03-07-2022: Call made back to [facility] and spoke to [facility]. New verbal order given to [facility] for the Dexcom continuous glucose monitoring system. Dr. Wynetta Emery updated. Will continue to monitor and set up a time to work with the patient on getting the system started and maintaining.  03-12-2022: The patient states her blood sugars have been better. She states that it was down to 118 yesterday am and it was 137 this am. She is checking it one time a day. Is having a hard time getting blood to come out to test. Education on running fingers under warm water and massaging the area before sticking to see if this would help with finger sticks. Education and support provided. 05-23-2022: The patient is currently doing finger sticks until she gets new Dexcom sensor placed.  call provider for findings outside established parameters;       Review of patient status, including review of consultants reports, relevant laboratory and other test results, and medications completed;       Advised patient to discuss blood sugar trends  with provider;  Falls:  (Status: Condition stable. Not addressed this visit.) Long Term Goal  Provided written and verbal education re: potential causes of falls and Fall prevention strategies Reviewed medications and discussed potential side effects of medications such as dizziness and frequent urination Advised patient of importance of notifying provider of falls. 05-07-2022: The patient had a fall on  04-21-2022 in the bathroom. Was evaluated for fractures as she hit her knees. Fracture to little toe Assessed for signs and symptoms of orthostatic hypotension Assessed for falls since last encounter. 05-07-2022: Last recorded fall on 04-21-2022. Education and support given Assessed patients knowledge of fall risk prevention secondary to previously provided education Advised patient to discuss falls and safety concerns  with provider   Patient Goals/Self-Care Activities: Take medications as prescribed   Attend all scheduled provider appointments Call pharmacy for medication refills 3-7 days in advance of running out of medications Attend church or other social activities Perform all self care activities independently  Perform IADL's (shopping, preparing meals, housekeeping, managing finances) independently Call provider office for new concerns or questions  Work with the social worker to address care coordination needs and will continue to work with the clinical team to address health care and disease management related needs call the Suicide and Crisis Lifeline: 988 call the [place] National Suicide Prevention Lifeline: 1-800-273-8255 or TTY: 1-800-799-4 TTY (1-800-799-4889) to talk to a trained counselor call 1-800-273-TALK (toll free, 24 hour hotline) if experiencing a Mental Health or Behavioral Health Crisis  call office if I gain more than 2 pounds in one day or 5 pounds in one week keep legs up while sitting track weight in diary use salt in moderation watch for swelling in feet, ankles and legs every day weigh myself daily begin a heart failure diary bring diary to all appointments develop a rescue plan follow rescue plan if symptoms flare-up eat more whole grains, fruits and vegetables, lean meats and healthy fats know when to call the doctorfor changes in HF and worsening edema or swelling track symptoms and what helps feel better or worse dress right for the weather, hot or  cold schedule appointment with eye doctor check blood sugar at prescribed times: when you have symptoms of low or high blood sugar, before and after exercise, and continuous reader ordered for the patient on 03-01-2022  check feet daily for cuts, sores or redness enter blood sugar readings and medication or insulin into daily log take the blood sugar log to all doctor visits take the blood sugar meter to all doctor visits trim toenails straight across drink 6 to 8 glasses of water each day eat fish at least once per week fill half of plate with vegetables limit fast food meals to no more than 1 per week manage portion size prepare main meal at home 3 to 5 days each week read food labels for fat, fiber, carbohydrates and portion size reduce red meat to 2 to 3 times a week switch to sugar-free drinks keep feet up while sitting wash and dry feet carefully every day wear comfortable, cotton socks wear comfortable, well-fitting shoes       Plan:Face to Face appointment with care management team member scheduled for: 05-28-2022 at 1 pm  Pam Tate RN, MSN, CCM Community Care Coordinator [place]  [facility] Mobile: [phone]          

## 2022-05-23 NOTE — Telephone Encounter (Signed)
LVM asking patient to call back to schedule a follow up appt with Dr. Wynetta Emery

## 2022-05-23 NOTE — Progress Notes (Signed)
This encounter was created in error - please disregard.

## 2022-05-24 NOTE — Telephone Encounter (Signed)
Change of pharmacy Requested Prescriptions  Pending Prescriptions Disp Refills  . spironolactone (ALDACTONE) 25 MG tablet [Pharmacy Med Name: SPIRONOLACTONE 25MG  TABLETS] 30 tablet 0    Sig: TAKE 1 TABLET(25 MG) BY MOUTH DAILY     Cardiovascular: Diuretics - Aldosterone Antagonist Failed - 05/23/2022 12:06 PM      Failed - Cr in normal range and within 180 days    Creatinine, Ser  Date Value Ref Range Status  03/01/2022 2.16 (H) 0.57 - 1.00 mg/dL Final   Creatinine, Urine  Date Value Ref Range Status  04/11/2021 139 mg/dL Final         Failed - K in normal range and within 180 days    Potassium  Date Value Ref Range Status  03/01/2022 3.4 (L) 3.5 - 5.2 mmol/L Final         Failed - eGFR is 30 or above and within 180 days    GFR calc Af Amer  Date Value Ref Range Status  06/06/2020 44 (L) >59 mL/min/1.73 Final    Comment:    **[facility] currently reports eGFR in compliance with the current**   recommendations of the [employer]. [facility] will   update reporting as new guidelines are published from the NKF-ASN   Task force.    GFR, Estimated  Date Value Ref Range Status  12/18/2021 21 (L) >60 mL/min Final    Comment:    (NOTE) Calculated using the CKD-EPI Creatinine Equation (2021)    eGFR  Date Value Ref Range Status  03/01/2022 23 (L) >59 mL/min/1.73 Final         Passed - Na in normal range and within 180 days    Sodium  Date Value Ref Range Status  03/01/2022 137 134 - 144 mmol/L Final         Passed - Last BP in normal range    BP Readings from Last 1 Encounters:  03/01/22 99/64         Passed - Valid encounter within last 6 months    Recent Outpatient Visits          2 months ago Type 2 diabetes mellitus with diabetic nephropathy, with long-term current use of insulin ([place])   [place], [name], DO   3 months ago Type 2 diabetes mellitus with diabetic nephropathy, with long-term current use of insulin ([place])    [place], [name], DO   3 months ago Type 2 diabetes mellitus with diabetic nephropathy, with long-term current use of insulin ([place])   [place], [name], DO   3 months ago Type 2 diabetes mellitus with diabetic nephropathy, with long-term current use of insulin ([place])   [place], [place], DO   4 months ago Dizziness   [facility] [place], [place], DO      Future Appointments            In 2 weeks Wynetta Emery, Barb Merino, DO [employer], [place]   In 3 months Goll[place], Kathlene November, MD [place], [place]

## 2022-05-24 NOTE — Telephone Encounter (Signed)
Requested medications are due for refill today.  no  Requested medications are on the active medications list.  yes  Last refill. 02/14/2022 #90 1 refill  Future visit scheduled.   yes  Notes to clinic.  No protocol assigned for refill - provider to review.     Requested Prescriptions  Pending Prescriptions Disp Refills   FERREX 150 150 MG capsule [Pharmacy Med Name: FERREX 150 150 MG CAP] 90 capsule 1    Sig: TAKE 1 CAPSULE BY MOUTH ONCE DAILY     Off-Protocol Failed - 05/23/2022 12:39 PM      Failed - Medication not assigned to a protocol, review manually.      Passed - Valid encounter within last 12 months    Recent Outpatient Visits           2 months ago Type 2 diabetes mellitus with diabetic nephropathy, with long-term current use of insulin ([place])   [place], [name], DO   3 months ago Type 2 diabetes mellitus with diabetic nephropathy, with long-term current use of insulin ([place])   [place], [name], DO   3 months ago Type 2 diabetes mellitus with diabetic nephropathy, with long-term current use of insulin ([place])   [place], [name], DO   3 months ago Type 2 diabetes mellitus with diabetic nephropathy, with long-term current use of insulin ([place])   [place], [place], DO   4 months ago Dizziness   [facility] [place], [place], DO       Future Appointments             In 2 weeks Wynetta Emery, Barb Merino, DO [employer], [place]   In 3 months Gollan, Kathlene November, MD [place], [facility]

## 2022-05-26 ENCOUNTER — Other Ambulatory Visit: Payer: Self-pay | Admitting: Family Medicine

## 2022-05-27 DIAGNOSIS — R600 Localized edema: Secondary | ICD-10-CM | POA: Diagnosis not present

## 2022-05-27 DIAGNOSIS — I890 Lymphedema, not elsewhere classified: Secondary | ICD-10-CM | POA: Diagnosis not present

## 2022-05-27 DIAGNOSIS — I10 Essential (primary) hypertension: Secondary | ICD-10-CM | POA: Diagnosis not present

## 2022-05-27 DIAGNOSIS — N179 Acute kidney failure, unspecified: Secondary | ICD-10-CM | POA: Diagnosis not present

## 2022-05-27 DIAGNOSIS — E876 Hypokalemia: Secondary | ICD-10-CM | POA: Diagnosis not present

## 2022-05-27 DIAGNOSIS — N1832 Chronic kidney disease, stage 3b: Secondary | ICD-10-CM | POA: Diagnosis not present

## 2022-05-27 DIAGNOSIS — E1122 Type 2 diabetes mellitus with diabetic chronic kidney disease: Secondary | ICD-10-CM | POA: Diagnosis not present

## 2022-05-27 DIAGNOSIS — N184 Chronic kidney disease, stage 4 (severe): Secondary | ICD-10-CM | POA: Diagnosis not present

## 2022-05-27 NOTE — Telephone Encounter (Signed)
Requested Prescriptions  Pending Prescriptions Disp Refills  . spironolactone (ALDACTONE) 25 MG tablet [Pharmacy Med Name: SPIRONOLACTONE 25MG  TABLETS] 90 tablet     Sig: TAKE 1 TABLET(25 MG) BY MOUTH DAILY     Cardiovascular: Diuretics - Aldosterone Antagonist Failed - 05/26/2022  5:34 AM      Failed - Cr in normal range and within 180 days    Creatinine, Ser  Date Value Ref Range Status  03/01/2022 2.16 (H) 0.57 - 1.00 mg/dL Final   Creatinine, Urine  Date Value Ref Range Status  04/11/2021 139 mg/dL Final         Failed - K in normal range and within 180 days    Potassium  Date Value Ref Range Status  03/01/2022 3.4 (L) 3.5 - 5.2 mmol/L Final         Failed - eGFR is 30 or above and within 180 days    GFR calc Af Amer  Date Value Ref Range Status  06/06/2020 44 (L) >59 mL/min/1.73 Final    Comment:    **[facility] currently reports eGFR in compliance with the current**   recommendations of the [employer]. [facility] will   update reporting as new guidelines are published from the NKF-ASN   Task force.    GFR, Estimated  Date Value Ref Range Status  12/18/2021 21 (L) >60 mL/min Final    Comment:    (NOTE) Calculated using the CKD-EPI Creatinine Equation (2021)    eGFR  Date Value Ref Range Status  03/01/2022 23 (L) >59 mL/min/1.73 Final         Passed - Na in normal range and within 180 days    Sodium  Date Value Ref Range Status  03/01/2022 137 134 - 144 mmol/L Final         Passed - Last BP in normal range    BP Readings from Last 1 Encounters:  03/01/22 99/64         Passed - Valid encounter within last 6 months    Recent Outpatient Visits          2 months ago Type 2 diabetes mellitus with diabetic nephropathy, with long-term current use of insulin ([place])   [place], [name], DO   3 months ago Type 2 diabetes mellitus with diabetic nephropathy, with long-term current use of insulin ([place])   [place], [name], DO   3 months ago Type 2 diabetes mellitus with diabetic nephropathy, with long-term current use of insulin ([place])   [place], [name], DO   4 months ago Type 2 diabetes mellitus with diabetic nephropathy, with long-term current use of insulin ([place])   [place], [place], DO   4 months ago Dizziness   [facility] [place], [place], DO      Future Appointments            In 1 week Wynetta Emery, Barb Merino, DO [employer], [place]   In 3 months Gollan, Kathlene November, MD [place], [facility]

## 2022-05-28 ENCOUNTER — Ambulatory Visit: Payer: Medicare PPO

## 2022-05-28 ENCOUNTER — Ambulatory Visit: Payer: Self-pay

## 2022-05-28 DIAGNOSIS — Z794 Long term (current) use of insulin: Secondary | ICD-10-CM

## 2022-05-28 DIAGNOSIS — N184 Chronic kidney disease, stage 4 (severe): Secondary | ICD-10-CM

## 2022-05-28 NOTE — Chronic Care Management (AMB) (Signed)
Chronic Care Management   CCM RN Visit Note  05/28/2022 Name: Karen Farley MRN: [id number] DOB: 07-17-1946  Subjective: Karen Farley is a 76 y.o. year old female who is a primary care patient of Valerie Roys, DO. The care management team was consulted for assistance with disease management and care coordination needs.    Engaged with patient face to face for follow up visit in response to provider referral for case management and/or care coordination services.   Consent to Services:  The patient was given information about Chronic Care Management services, agreed to services, and gave verbal consent prior to initiation of services.  Please see initial visit note for detailed documentation.   Patient agreed to services and verbal consent obtained.   Assessment: Review of patient past medical history, allergies, medications, health status, including review of consultants reports, laboratory and other test data, was performed as part of comprehensive evaluation and provision of chronic care management services.   SDOH (Social Determinants of Health) assessments and interventions performed:    CCM Care Plan  No Known Allergies  Outpatient Encounter Medications as of 05/28/2022  Medication Sig Note   ACCU-CHEK GUIDE test strip USE TO CHECK BLOOD SUGAR DAILY    apixaban (ELIQUIS) 5 MG TABS tablet Take 1 tablet (5 mg total) by mouth 2 (two) times daily.    Cholecalciferol 125 MCG (5000 UT) TABS Take by mouth. 12/17/2021: Once a week    Continuous Glucose Monitor Sup KIT 1 each by Does not apply route continuous.    Elastic Bandages & Supports (MEDICAL COMPRESSION STOCKINGS) MISC by Does not apply route.    empagliflozin (JARDIANCE) 25 MG TABS tablet Take 1 tablet (25 mg total) by mouth daily before breakfast.    folic acid (FOLVITE) 1 MG tablet Take 1 tablet (1 mg total) by mouth daily.    insulin glargine, 2 Unit Dial, (TOUJEO MAX SOLOSTAR) 300 UNIT/ML Solostar Pen Inject 48  Units into the skin at bedtime.    Insulin Pen Needle (PEN NEEDLES) 30G X 8 MM MISC 1 each by Does not apply route daily.    iron polysaccharides (NIFEREX) 150 MG capsule TAKE 1 CAPSULE(150 MG) BY MOUTH DAILY    latanoprost (XALATAN) 0.005 % ophthalmic solution Place 1 drop into both eyes at bedtime.    metolazone (ZAROXOLYN) 2.5 MG tablet Take 1 tablet (2.5 mg total) by mouth every Monday, Wednesday, and Friday.    metoprolol succinate (TOPROL-XL) 25 MG 24 hr tablet Take 0.5 tablets (12.5 mg total) by mouth daily.    midodrine (PROAMATINE) 10 MG tablet Take 1 tablet (10 mg total) by mouth 3 (three) times daily with meals.    Multiple Vitamin (MULTIVITAMIN WITH MINERALS) TABS tablet Take 1 tablet by mouth daily.    Niacin (VITAMIN B-3 PO) Take 5,000 Units by mouth once a week.    pantoprazole (PROTONIX) 40 MG tablet Take 1 tablet (40 mg total) by mouth 2 (two) times daily.    polyethylene glycol (MIRALAX / GLYCOLAX) 17 g packet Take 17 g by mouth daily.    potassium chloride SA (KLOR-CON) 20 MEQ tablet Take 2 tablets (40 mEq total) by mouth daily.    spironolactone (ALDACTONE) 25 MG tablet TAKE 1 TABLET(25 MG) BY MOUTH DAILY    torsemide (DEMADEX) 20 MG tablet TAKE 1 TO 2 TABLETS BY MOUTH DAILY. ONLY TAKE 2 TABLETS FOR 1 TO 3 DAYS AND IF PERSISTS, CALL WITH BAD SWELLING    vitamin B-12 (CYANOCOBALAMIN) 500 MCG  tablet Take 1 tablet (500 mcg total) by mouth daily.    No facility-administered encounter medications on file as of 05/28/2022.    Patient Active Problem List   Diagnosis Date Noted   Pulmonary hypertension, primary ([place]) 09/07/2021   Acute respiratory failure with hypoxia (HCC)    Shock circulatory ([place]) 08/05/2021   Pericardial effusion 08/05/2021   Acute blood loss anemia 08/04/2021   Anasarca    Stage 3b chronic kidney disease ([place]) 05/17/2021   Pleural effusion 05/17/2021   Acute on chronic diastolic CHF (congestive heart failure) ([place]) 05/17/2021   Obesity, Class III, BMI  40-49.9 (morbid obesity) ([place]) 05/17/2021   Chronic anticoagulation 05/17/2021   Acute kidney injury superimposed on CKD ([place]) 04/11/2021   Hyponatremia 04/11/2021   Acute on chronic heart failure with preserved ejection fraction (HFpEF) ([place]) 04/11/2021   Chronic venous insufficiency 08/14/2020   PAD (peripheral artery disease) ([place]) 08/14/2020   Bilateral primary osteoarthritis of knee 04/13/2020   Type 2 diabetes mellitus ([place]) 04/13/2020   Acquired spondylolisthesis 12/17/2019   Osteoarthritis of knee 12/17/2019   Lumbar radiculopathy 12/17/2019   Atrial fibrillation ([place]) 11/20/2019   Closed fracture of radial styloid 08/06/2019   Displaced fracture of unspecified radial styloid process, initial encounter for closed fracture 08/06/2019   Controlled substance agreement signed 01/24/2018   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   Piriformis syndrome 07/01/2016   Sacroiliac joint dysfunction 01/09/2016   Joint disorder, unspecified 01/09/2016   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 diabetes mellitus ([place]) 04/28/2015   Lymphedema 03/27/2015   Edema 03/27/2015   Open-angle glaucoma, mild stage    Osteoarthritis of both knees    Type 2 diabetes mellitus with renal complication (HCC)    Osteopenia    Benign hypertensive renal disease    Hyperlipidemia    Degeneration of lumbosacral intervertebral disc     Conditions to be addressed/monitored:DMII and CKD Stage 4  Care Plan : RNCM: General Plan of Care (Adult) for Chronic Disease Management and Care Coordination Needs  Updates made by Vanita Ingles, RN since 05/28/2022 12:00 AM     Problem: RNCM: Development of plan of care for Chronic Disease Management (HF, DM, CKD4)   Priority: High     Long-Range Goal: RNCM: Effective Management  of plan of care for Chronic Disease Management (HF, DM, CKD4)   Start Date: 03/04/2022  Expected End Date: 03/04/2022  Priority: High  Note:   Current Barriers:  Knowledge Deficits  related to plan of care for management of CHF, DMII, and CKD Stage 4  Care Coordination needs related to education needs on how to use the continuous glucose meter system and DM education and support  Chronic Disease Management support and education needs related to CHF, DMII, and CKD Stage 4  RNCM Clinical Goal(s):  Patient will verbalize understanding of plan for management of CHF, DMII, and CKD Stage 4 as evidenced by keeping appointments with the provider, following the plan of care, taking medications, following dietary restrictions, and working with the CCM team to effectively manage health and well being.  demonstrate understanding of rationale for each prescribed medication as evidenced by taking medications as prescribed and calling for refills before running out of medications.     attend all scheduled medical appointments: with pcp and specialist as evidenced by keeping appointments and calling for schedule change needs         demonstrate improved and ongoing adherence to prescribed treatment plan for CHF, DMII, and  CKD Stage 4 as evidenced by lab work stable with trending down A1C level, stable VS, no exacerbations of chronic conditions.  demonstrate a decrease in CHF, DMII, and CKD Stage 4 exacerbations  as evidenced by no acute onset of chronic conditions, no unplanned hospital admits, and working with the CCM team to effectively manage health and well being.  demonstrate ongoing self health care management ability for effective management of chronic conditions  as evidenced by working with the CCM team through collaboration with Consulting civil engineer, provider, and care team.   Interventions: 1:1 collaboration with primary care provider regarding development and update of comprehensive plan of care as evidenced by provider attestation and co-signature Inter-disciplinary care team collaboration (see longitudinal plan of care) Evaluation of current treatment plan related to  self management  and patient's adherence to plan as established by provider   Heart Failure Interventions:  (Status: Goal on Track (progressing): YES.)  Long Term Goal  Wt Readings from Last 3 Encounters:  05/07/22 168 lb (76.2 kg)  03/01/22 171 lb 6.4 oz (77.7 kg)  02/22/22 168 lb 4 oz (76.3 kg)  05-07-2022: The patient weighed at the office and her weight was 168 pounds  Basic overview and discussion of pathophysiology of Heart Failure reviewed.  Provided education on low sodium diet. 05-06-2022: The patient is compliant with heart healthy/ADA diet. The patient will have a face to face visit with the [facility] on 05-07-2022 for help with blood glucose device and educational material for chronic disease management. 05-07-2022: The patient states she has been watching what she eats but she has been drinking more water than usual because of the humidity. The patient states she has had a little more fluid on board than usual. Education and support given. Reviewed Heart Failure Action Plan in depth and provided written copy Assessed need for readable accurate scales in home. 03-07-2022: The patient has a scale and uses on a consistent basis. 05-07-2022: The patient had to get batteries for her scale and she wanted to get a baseline reading because she felt her weight was more than it should be. Provided education about placing scale on hard, flat surface Advised patient to weigh each morning after emptying bladder Discussed importance of daily weight and advised patient to weigh and record daily. 05-07-2022: The patient is compliant with daily weights.  Reviewed role of diuretics in prevention of fluid overload and management of heart failure. 05-07-2022: Education and review  Discussed the importance of keeping all appointments with provider. 05-07-2022: Knows to follow up accordingly with the provider. Provided patient with education about the role of exercise in the management of heart failure Advised patient to discuss  changes in heart health and CHF with provider Screening for signs and symptoms of depression related to chronic disease state  Assessed social determinant of health barriers  Chronic Kidney Disease (Status: Goal on Track (progressing): YES.)  Long Term Goal  Last practice recorded BP readings:  BP Readings from Last 3 Encounters:  03/01/22 99/64  02/22/22 100/60  01/25/22 99/66  Most recent eGFR/CrCl:  Lab Results  Component Value Date   EGFR 23 (L) 03/01/2022    No components found for: CRCL  Evaluation of current treatment plan related to chronic kidney disease self management and patient's adherence to plan as established by provider. 05-06-2022: The patient is compliant with the plan of care and sees the provider on a regular basis.  The patient will be in the office on 05-07-2022 to met with  the [facility] for education and information on effective management of chronic conditions. 05-07-2022: The patient has an appointment in August with the [facility] specialist. She ask if Dr. Wynetta Emery could send a message asking for Dr. Candiss Norse to get an A1C when he does her other lab work. Will send an inbasket message to the provider and ask for help with getting A1C bloodwork. 05-28-2022: The patient has appointments next week with specialist. She denies any new concerns about CKD but will discuss the plan of care with the specialist at her appointment. The patient had no new issues today or questions related to her CKD. Will see the specialist for follow up. Knows to call for changes or new needs.  Provided education to patient re: stroke prevention, s/s of heart attack and stroke    Reviewed prescribed diet heart healthy/ADA diet. 05-28-2022: The patient is monitoring her dietary intake on a more consistent basis. Review of foods that are good for her and to monitor foods with concentrated sweets Education and support given Reviewed medications with patient and discussed importance of compliance. 05-28-2022: The  patient is compliant with medications. Denies any medication needs today.    Advised patient, providing education and rationale, to monitor blood pressure daily and record, calling PCP for findings outside established parameters    Discussed complications of poorly controlled blood pressure such as heart disease, stroke, circulatory complications, vision complications, kidney impairment, sexual dysfunction    Advised patient to discuss changes in kidney function or urinary patterns  with provider    Discussed plans with patient for ongoing care management follow up and provided patient with direct contact information for care management team    Screening for signs and symptoms of depression related to chronic disease state      Discussed the impact of chronic kidney disease on daily life and mental health and acknowledged and normalized feelings of disempowerment, fear, and frustration    Assessed social determinant of health barriers    Provided education on kidney disease progression    Engage patient in early, proactive and ongoing discussion about goals of care and what matters most to them    Support coping and stress management by recognizing current strategies and assist in developing new strategies such as mindfulness, journaling, relaxation techniques, problem-solving      Diabetes:  (Status: Goal on Track (progressing): YES.) Long Term Goal   Lab Results  Component Value Date   HGBA1C 11.4 (H) 03/01/2022    Assessed patient's understanding of A1c goal: <7%. 05-06-2022: The patient knows the goal of therapy and knows it is essential to get her DM under control. Will meet with the patient in the office tomorrow for education and support on the Dexcom. 05-07-2022: The patient accompanied by her husband came to the office today and assisted with Dexcom meter. The patient asking for blood work of A1C to be requested by pcp to kidney specialist. Will inbox Dr. Wynetta Emery for assistance and request.  05-23-2022: The patient has removed the sensor on her arm but is still uncomfortable with placing a new sensor on her abdomen. Wishes to have another demonstration with the [facility] on how to place the sensor on her abdomen. Education by phone but the patient requested to have a face to face visit with the RNCM. Face to face visit scheduled for 05-28-2022 at 1 pm for demonstration on placing sensor and restarting readings. Will do extensive education with the patient so she will feel comfortable with changing out sensor.  May illicit pharmacy help also if the patient continues to struggle with sensor placement. Will collaborate with pharm D for ideas or suggestions. 05-28-2022: Met with the patient and her husband in the office today for new sensor placement of her Dexcom sensor. The patient states that she just needs a little more guidance on placement of her sensor. Allowed the patient to place the sensor on her abdomen and applied the protective sticker over top of it. Walked the patient and her husband through the set up of her dexcom with pairing of her sensor. The patient states [place] [facility] [place] [facility] and wanted to know if it was okay to wear the sensor. Education and support given. The patient verbalized understanding and feels like she will be able to change out her sensor at the next sensor change. Review of average and her 30 day average glucose was 254. Her self checked blood sugar this am was 213. The patient reminded if she feels different than what the meter is reading to do a finger stick to check her blood sugar. All questions answered. The patient verbalized understanding.  Provided education to patient about basic DM disease process. 05-28-2022: Review and education; Reviewed medications with patient and discussed importance of medication adherence. 03-04-2022: The pcp wants to add meal time insulin coverage but also would like the patient to have a continuous glucose reader. This was ordered  on 03-01-2022. Call made to [facility] in [place] on 03-04-2022 to follow up on the glucose reader as the patient told the scheduler they gave her a regular meter. The pcp notified by secure chat messaging and gave the [facility] permission to give a verbal order for continuous glucose meter/ freestyle [place] or whatever the insurance would cover. The RNCM spoke to [place] at [facility] and he stated that someone overlooked the continuous glucose meter order and gave her a regular meter. The pcp gave the [facility] permission to give a verbal order for the [facility]. Marya Amsler received the verbal order and will process the order for the [employer]. He states the order should be completed today unless a PA is needed. The RNCM has followed up with the patient. She has not heard back from [facility]. Will touch base with the patient in the morning to see if she has obtained the [facility] and will set up a time to help her get started with monitoring. Will collaborate with the pcp and let the pcp know the current status and updates. 03-06-2022: The patient states that she called [facility] last night and they told her they were still working on her [employer]. She does not know when it will be available. The patient states she will call the [facility] when it is available. The RNCM advised that a call would be made on Friday to touch base with the patient. Will continue to monitor for changes and set up a time when the [facility] can assist with showing the patient how to work the meter for continuous blood glucose monitoring. 03-07-2022: The patient called and left a message that [facility] said that the pharmacist told her that the insurance wanted the [facility] reader and system instead of [place]. Dr. Wynetta Emery notified and permission given to order the patient the Dexcom continuous glucose system for the patient. Call made to [facility] and spoke to [place]. Verbal order given to [facility] for the Dexcom continuous glucose  monitoring system for sensors, reader, and transmitter. Magda Paganini said it may go  through or may need a PA but if it needed a PA they would put it through. Magda Paganini states the [facility] system is the preferred system for Medicare to pay for. Dr. Wynetta Emery updated that the order has been placed. Call made back to the patient and informed her that [facility] has the new information for processing the Dexcom.  The patient has the [facility] number and will call the [facility] when the system is ready to be used. Magda Paganini informed the [facility] that the Dexcom was in stock and she would process the order. 03-12-2022: The patient states that she called [facility] about 30 minutes before the [facility] called for follow up. They tell her they are still working on the order. The patient states she called Saturday also. She said it must have something to do with Medicare. The patient states that as soon as she hears from them and she has the continuous glucose system she will let the [facility] know. 05-06-2022: Incoming call from the patient letting the RNCM know she has her Dexcom meter and is ready for assistance with setting up the device. The patient will come to [facility] on 05-07-2022 and bring her Dexcom meter with her and the [facility] will assist the patient in getting her device set up. The patient knows to bring her kit with her and will meet the [facility] at 1130 am. Will assist the patient with educational needs and recommendations. 05-07-2022: The patient accompanied by her husband came to see the [facility] face to face. The patient and husband reviewed a video about the Dexom and how to use properly. The patients sensor started and the reader set up by the [facility]. Allowed the patient and her husband to ask questions and operate the reader and how to get back to various screens to see trends and clear alerts. Also advised the patient to bring the reader to all of her MD visits for review. The husband and patient denied any questions or concerns. Confirmed with pharm D that the  patient was able to place sensor on her abdomen if needed. Education and information provided to the patient. 05-23-2022: Will see the patient face to face in the office on 05-28-2022 for additional education and assistance with placement of the sensor on her abdomen. Will reach out and collaborate with the pharm D on any other recommendations for successful understanding of sensor placement and confidence in placing sensor;    05-28-2022: Saw the patient face to face today and assisted the patient in placement of her new sensor. She feels more comfortable with sensor placement at this time. The patient knows to call for new questions or concerns.    Reviewed prescribed diet with patient heart healthy/ADA. 05-28-2022: Education and support given Counseled on importance of regular laboratory monitoring as prescribed;        Discussed plans with patient for ongoing care management follow up and provided patient with direct contact information for care management team;      Provided patient with written educational materials related to hypo and hyperglycemia and importance of correct treatment;       Reviewed scheduled/upcoming provider appointments including: Education on calling for a follow up appointment with the pcp for review since she has operational continuous glucose meter          Advised patient, providing education and rationale, to check cbg when you have symptoms of low or high blood sugar, before and after exercise, and continuous reader [facility] ordered on 03-01-2022- RNCM called and  spoke to the pharmacist on 03-04-2022- Greg with [facility] for assistance as they did not give the patient the correct meter on 03-01-2022  and record. 03-06-2022: The patient states her blood sugar this am was 197. Denies any acute findings.  03-07-2022: Call made back to [facility] and spoke to [place]. New verbal order given to [facility] for the Dexcom continuous glucose monitoring system. Dr. Wynetta Emery updated. Will continue  to monitor and set up a time to work with the patient on getting the system started and maintaining.  03-12-2022: The patient states her blood sugars have been better. She states that it was down to 118 yesterday am and it was 137 this am. She is checking it one time a day. Is having a hard time getting blood to come out to test. Education on running fingers under warm water and massaging the area before sticking to see if this would help with finger sticks. Education and support provided. 05-23-2022: The patient is currently doing finger sticks until she gets new Dexcom sensor placed.  call provider for findings outside established parameters;       Review of patient status, including review of consultants reports, relevant laboratory and other test results, and medications completed;       Advised patient to discuss blood sugar trends  with provider;       Falls:  (Status: Condition stable. Not addressed this visit.) Long Term Goal  Provided written and verbal education re: potential causes of falls and Fall prevention strategies Reviewed medications and discussed potential side effects of medications such as dizziness and frequent urination Advised patient of importance of notifying provider of falls. 05-07-2022: The patient had a fall on 04-21-2022 in the bathroom. Was evaluated for fractures as she hit her knees. Fracture to little toe Assessed for signs and symptoms of orthostatic hypotension Assessed for falls since last encounter. 05-07-2022: Last recorded fall on 04-21-2022. Education and support given Assessed patients knowledge of fall risk prevention secondary to previously provided education Advised patient to discuss falls and safety concerns  with provider   Patient Goals/Self-Care Activities: Take medications as prescribed   Attend all scheduled provider appointments Call pharmacy for medication refills 3-7 days in advance of running out of medications Attend church or other social  activities Perform all self care activities independently  Perform IADL's (shopping, preparing meals, housekeeping, managing finances) independently Call provider office for new concerns or questions  Work with the social worker to address care coordination needs and will continue to work with the clinical team to address health care and disease management related needs call the Suicide and Crisis Lifeline: 988 call the [place] National Suicide Prevention Lifeline: (206)593-4898 or TTY: 623 336 6333 TTY (475)020-7438) to talk to a trained counselor call 1-800-273-TALK (toll free, 24 hour hotline) if experiencing a Mental Health or [place]  call office if I gain more than 2 pounds in one day or 5 pounds in one week keep legs up while sitting track weight in diary use salt in moderation watch for swelling in feet, ankles and legs every day weigh myself daily begin a heart failure diary bring diary to all appointments develop a rescue plan follow rescue plan if symptoms flare-up eat more whole grains, fruits and vegetables, lean meats and healthy fats know when to call the doctorfor changes in HF and worsening edema or swelling track symptoms and what helps feel better or worse dress right for the weather, hot or cold schedule appointment with eye doctor check  blood sugar at prescribed times: when you have symptoms of low or high blood sugar, before and after exercise, and continuous reader ordered for the patient on 03-01-2022  check feet daily for cuts, sores or redness enter blood sugar readings and medication or insulin into daily log take the blood sugar log to all doctor visits take the blood sugar meter to all doctor visits trim toenails straight across drink 6 to 8 glasses of water each day eat fish at least once per week fill half of plate with vegetables limit fast food meals to no more than 1 per week manage portion size prepare main meal at home 3 to 5 days  each week read food labels for fat, fiber, carbohydrates and portion size reduce red meat to 2 to 3 times a week switch to sugar-free drinks keep feet up while sitting wash and dry feet carefully every day wear comfortable, cotton socks wear comfortable, well-fitting shoes       Plan:Telephone follow up appointment with care management team member scheduled for:  07-02-2022 at 1 pm  [place], MSN, [facility] Mobile: [phone]

## 2022-05-28 NOTE — Patient Instructions (Signed)
Visit Information  Thank you for taking time to visit with me today. Please don't hesitate to contact me if I can be of assistance to you before our next scheduled telephone appointment.  Following are the goals we discussed today:  Chronic Kidney Disease (Status: Goal on Track (progressing): YES.)  Long Term Goal  Last practice recorded BP readings:     BP Readings from Last 3 Encounters:  03/01/22 99/64  02/22/22 100/60  01/25/22 99/66  Most recent eGFR/CrCl:       Lab Results  Component Value Date    EGFR 23 (L) 03/01/2022    No components found for: CRCL   Evaluation of current treatment plan related to chronic kidney disease self management and patient's adherence to plan as established by provider. 05-06-2022: The patient is compliant with the plan of care and sees the provider on a regular basis.  The patient will be in the office on 05-07-2022 to met with the [facility] for education and information on effective management of chronic conditions. 05-07-2022: The patient has an appointment in August with the [facility] specialist. She ask if Dr. Wynetta Emery could send a message asking for Dr. Candiss Norse to get an A1C when he does her other lab work. Will send an inbasket message to the provider and ask for help with getting A1C bloodwork. 05-28-2022: The patient has appointments next week with specialist. She denies any new concerns about CKD but will discuss the plan of care with the specialist at her appointment. The patient had no new issues today or questions related to her CKD. Will see the specialist for follow up. Knows to call for changes or new needs.  Provided education to patient re: stroke prevention, s/s of heart attack and stroke    Reviewed prescribed diet heart healthy/ADA diet. 05-28-2022: The patient is monitoring her dietary intake on a more consistent basis. Review of foods that are good for her and to monitor foods with concentrated sweets Education and support given Reviewed  medications with patient and discussed importance of compliance. 05-28-2022: The patient is compliant with medications. Denies any medication needs today.    Advised patient, providing education and rationale, to monitor blood pressure daily and record, calling PCP for findings outside established parameters    Discussed complications of poorly controlled blood pressure such as heart disease, stroke, circulatory complications, vision complications, kidney impairment, sexual dysfunction    Advised patient to discuss changes in kidney function or urinary patterns  with provider    Discussed plans with patient for ongoing care management follow up and provided patient with direct contact information for care management team    Screening for signs and symptoms of depression related to chronic disease state      Discussed the impact of chronic kidney disease on daily life and mental health and acknowledged and normalized feelings of disempowerment, fear, and frustration    Assessed social determinant of health barriers    Provided education on kidney disease progression    Engage patient in early, proactive and ongoing discussion about goals of care and what matters most to them    Support coping and stress management by recognizing current strategies and assist in developing new strategies such as mindfulness, journaling, relaxation techniques, problem-solving        Diabetes:  (Status: Goal on Track (progressing): YES.) Long Term Goal         Lab Results  Component Value Date    HGBA1C 11.4 (H) 03/01/2022  Assessed patient's understanding of A1c goal: <7%. 05-06-2022: The patient knows the goal of therapy and knows it is essential to get her DM under control. Will meet with the patient in the office tomorrow for education and support on the Dexcom. 05-07-2022: The patient accompanied by her husband came to the office today and assisted with Dexcom meter. The patient asking for blood work of A1C to be  requested by pcp to kidney specialist. Will inbox Dr. Wynetta Emery for assistance and request. 05-23-2022: The patient has removed the sensor on her arm but is still uncomfortable with placing a new sensor on her abdomen. Wishes to have another demonstration with the [facility] on how to place the sensor on her abdomen. Education by phone but the patient requested to have a face to face visit with the RNCM. Face to face visit scheduled for 05-28-2022 at 1 pm for demonstration on placing sensor and restarting readings. Will do extensive education with the patient so she will feel comfortable with changing out sensor. May illicit pharmacy help also if the patient continues to struggle with sensor placement. Will collaborate with pharm D for ideas or suggestions. 05-28-2022: Met with the patient and her husband in the office today for new sensor placement of her Dexcom sensor. The patient states that she just needs a little more guidance on placement of her sensor. Allowed the patient to place the sensor on her abdomen and applied the protective sticker over top of it. Walked the patient and her husband through the set up of her dexcom with pairing of her sensor. The patient states [place] [facility] and wanted to know if it was okay to wear the sensor. Education and support given. The patient verbalized understanding and feels like she will be able to change out her sensor at the next sensor change. Review of average and her 30 day average glucose was 254. Her self checked blood sugar this am was 213. The patient reminded if she feels different than what the meter is reading to do a finger stick to check her blood sugar. All questions answered. The patient verbalized understanding.  Provided education to patient about basic DM disease process. 05-28-2022: Review and education; Reviewed medications with patient and discussed importance of medication adherence. 03-04-2022: The pcp wants to add meal time insulin  coverage but also would like the patient to have a continuous glucose reader. This was ordered on 03-01-2022. Call made to [facility] in [place] on 03-04-2022 to follow up on the glucose reader as the patient told the scheduler they gave her a regular meter. The pcp notified by secure chat messaging and gave the [facility] permission to give a verbal order for continuous glucose meter/ freestyle [place] or whatever the insurance would cover. The RNCM spoke to [place] at [facility] and he stated that someone overlooked the continuous glucose meter order and gave her a regular meter. The pcp gave the [facility] permission to give a verbal order for the [facility]. Marya Amsler received the verbal order and will process the order for the [employer]. He states the order should be completed today unless a PA is needed. The RNCM has followed up with the patient. She has not heard back from [facility]. Will touch base with the patient in the morning to see if she has obtained the [facility] and will set up a time to help her get started with monitoring. Will collaborate with the pcp and let the pcp  know the current status and updates. 03-06-2022: The patient states that she called [facility] last night and they told her they were still working on her [employer]. She does not know when it will be available. The patient states she will call the [facility] when it is available. The RNCM advised that a call would be made on Friday to touch base with the patient. Will continue to monitor for changes and set up a time when the [facility] can assist with showing the patient how to work the meter for continuous blood glucose monitoring. 03-07-2022: The patient called and left a message that [facility] said that the pharmacist told her that the insurance wanted the [facility] reader and system instead of [place]. Dr. Wynetta Emery notified and permission given to order the patient the Dexcom continuous glucose system for the patient. Call made to  [facility] and spoke to [place]. Verbal order given to [facility] for the Dexcom continuous glucose monitoring system for sensors, reader, and transmitter. Magda Paganini said it may go through or may need a PA but if it needed a PA they would put it through. Magda Paganini states the [facility] system is the preferred system for Medicare to pay for. Dr. Wynetta Emery updated that the order has been placed. Call made back to the patient and informed her that [facility] has the new information for processing the Dexcom.  The patient has the [facility] number and will call the [facility] when the system is ready to be used. Magda Paganini informed the [facility] that the Dexcom was in stock and she would process the order. 03-12-2022: The patient states that she called [facility] about 30 minutes before the [facility] called for follow up. They tell her they are still working on the order. The patient states she called Saturday also. She said it must have something to do with Medicare. The patient states that as soon as she hears from them and she has the continuous glucose system she will let the [facility] know. 05-06-2022: Incoming call from the patient letting the RNCM know she has her Dexcom meter and is ready for assistance with setting up the device. The patient will come to [facility] on 05-07-2022 and bring her Dexcom meter with her and the [facility] will assist the patient in getting her device set up. The patient knows to bring her kit with her and will meet the [facility] at 1130 am. Will assist the patient with educational needs and recommendations. 05-07-2022: The patient accompanied by her husband came to see the [facility] face to face. The patient and husband reviewed a video about the Dexom and how to use properly. The patients sensor started and the reader set up by the [facility]. Allowed the patient and her husband to ask questions and operate the reader and how to get back to various screens to see trends and clear alerts. Also advised the patient to bring the reader to all of her MD visits for  review. The husband and patient denied any questions or concerns. Confirmed with pharm D that the patient was able to place sensor on her abdomen if needed. Education and information provided to the patient. 05-23-2022: Will see the patient face to face in the office on 05-28-2022 for additional education and assistance with placement of the sensor on her abdomen. Will reach out and collaborate with the pharm D on any other recommendations for successful understanding of sensor placement and confidence in placing sensor;    05-28-2022: Saw the patient face to face today and assisted the  patient in placement of her new sensor. She feels more comfortable with sensor placement at this time. The patient knows to call for new questions or concerns.    Reviewed prescribed diet with patient heart healthy/ADA. 05-28-2022: Education and support given Counseled on importance of regular laboratory monitoring as prescribed;        Discussed plans with patient for ongoing care management follow up and provided patient with direct contact information for care management team;      Provided patient with written educational materials related to hypo and hyperglycemia and importance of correct treatment;       Reviewed scheduled/upcoming provider appointments including: Education on calling for a follow up appointment with the pcp for review since she has operational continuous glucose meter          Advised patient, providing education and rationale, to check cbg when you have symptoms of low or high blood sugar, before and after exercise, and continuous reader [facility] Elenor Legato ordered on 03-01-2022- RNCM called and spoke to the pharmacist on 03-04-2022- Greg with [facility] for assistance as they did not give the patient the correct meter on 03-01-2022  and record. 03-06-2022: The patient states her blood sugar this am was 197. Denies any acute findings.  03-07-2022: Call made back to [facility] and spoke to [place]. New verbal order given  to [facility] for the Dexcom continuous glucose monitoring system. Dr. Wynetta Emery updated. Will continue to monitor and set up a time to work with the patient on getting the system started and maintaining.  03-12-2022: The patient states her blood sugars have been better. She states that it was down to 118 yesterday am and it was 137 this am. She is checking it one time a day. Is having a hard time getting blood to come out to test. Education on running fingers under warm water and massaging the area before sticking to see if this would help with finger sticks. Education and support provided. 05-23-2022: The patient is currently doing finger sticks until she gets new Dexcom sensor placed.  call provider for findings outside established parameters;       Review of patient status, including review of consultants reports, relevant laboratory and other test results, and medications completed;       Advised patient to discuss blood sugar trends  with provider;         Our next appointment is by telephone on 07-02-2022 at 1 pm  Please call the care guide team at [phone] if you need to cancel or reschedule your appointment.   If you are experiencing a Mental Health or [place] or need someone to talk to, please call the Suicide and Crisis Lifeline: 988 call the [place] National Suicide Prevention Lifeline: 336-867-2933 or TTY: (256)668-2429 TTY (971)169-0652) to talk to a trained counselor call 1-800-273-TALK (toll free, 24 hour hotline)   The patient verbalized understanding of instructions, educational materials, and care plan provided today and DECLINED offer to receive copy of patient instructions, educational materials, and care plan.   Noreene Larsson RN, MSN, [facility] Mobile: [phone]

## 2022-06-03 DIAGNOSIS — Z961 Presence of intraocular lens: Secondary | ICD-10-CM | POA: Diagnosis not present

## 2022-06-03 LAB — HM DIABETES EYE EXAM

## 2022-06-03 NOTE — Progress Notes (Unsigned)
Patient ID: Karen Farley, female    DOB: 02-25-1946, 76 y.o.   MRN: [id number]  HPI  Karen Farley is a 76 y/o female with a history of DM, hyperlipidemia, HTN, CKD, DDD, GIB, lymphedema, permanent atrial fibrillation, pleural effusion and chronic heart failure.   Echo report from 11/14/21 reviewed and showed an EF of 60-65% along with mild LVH, small/moderate pericardial effusion and moderate MR. Echo report from 08/23/21 reviewed and showed an EF of 60-65% along with mild LVH, severe LAE and moderate MR.   Was in the ED 12/18/21 due to mechanical fall where she was evaluated and released.   She presents today for a follow-up visit with a chief complaint of moderate fatigue with minimal exertion. She describes this as chronic in nature. She has associated cough, minimal abdominal distention and difficulty sleeping along with this. She denies any dizziness, shortness of breath, chest pain, pedal edema, palpitations or weight gain.   Has been having some intermittent issues with constipation. Has been using miralax without relief. Says that she is going to be starting metamucil.   Past Medical History:  Diagnosis Date   A-fib [facility])    CHF (congestive heart failure) (HCC)    Chronic heart failure with preserved ejection fraction (HFpEF) (HCC)    a. 11/2019 Echo: EF 60-65%; b. 07/2021 Echo: EF 60-65%.   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 07/2021 req 2u prbcs. Eval deferred given resolution of bleeding and cardiac issues.   Hyperlipidemia    Hypertension    Lymphedema    Morbid obesity (HCC)    Osteoarthritis of both knees    Osteopenia    Pericardial effusion    a. 07/2021 Echo: Nl EF. Large pericardial effusion-->s/p pericardiocentesis of ; b. 08/23/2021 Echo: EF 60-65%, mild  LVH, Nl RV fxn, sev dil LA, moderate pericardial effusion. L pleural effusion.   Permanent atrial fibrillation (HCC)    Pleural effusion, left    a. 07/2021 s/p thoracentesis - .   Past Surgical History:  Procedure Laterality Date   CATARACT EXTRACTION W/PHACO Left 10/09/2021   Procedure: CATARACT EXTRACTION PHACO AND INTRAOCULAR LENS PLACEMENT (IOC) LEFT DIABETIC 4.84 00:37.8;  Surgeon: Galen Manila, MD;  Location: [facility];  Service: Ophthalmology;  Laterality: Left;  Diabetic   CATARACT EXTRACTION W/PHACO Right 10/30/2021   Procedure: CATARACT EXTRACTION PHACO AND INTRAOCULAR LENS PLACEMENT (IOC) RIGHT DIABETIC 5.65 00:38.5;  Surgeon: Galen Manila, MD;  Location: [facility];  Service: Ophthalmology;  Laterality: Right;  Diabetic   CHOLECYSTECTOMY     DILATION AND CURETTAGE OF UTERUS     IR FLUORO GUIDE CV LINE RIGHT  08/16/2021   IR THORACENTESIS ASP PLEURAL SPACE W/IMG GUIDE  08/16/2021   PERICARDIOCENTESIS N/A 08/05/2021   Procedure: PERICARDIOCENTESIS;  Surgeon: Marcina Millard, MD;  Location: [facility] INVASIVE CV LAB;  Service: Cardiovascular;  Laterality: N/A;   TEAR DUCT PROBING WITH STRABISMUS REPAIR Right    TONSILLECTOMY     Family History  Problem Relation Age of Onset   Heart disease Mother        CHF   Osteoporosis Mother    Breast cancer Mother    Heart disease Father 59   Cancer Brother        Colon  CA- 2002- Youngest brother   Parkinson's disease Brother        Younger Brother   Heart disease Brother        2 brothers   Cancer Brother    Breast cancer Maternal Grandmother    Social History   Tobacco Use   Smoking status: Never   Smokeless tobacco: Never  Substance Use Topics   Alcohol use: Not Currently    Alcohol/week: 0.0 standard drinks of alcohol    Comment: on rare occasion   No Known Allergies  Prior to Admission medications   Medication Sig Start Date End Date Taking? Authorizing Provider  ACCU-CHEK GUIDE  test strip USE TO CHECK BLOOD SUGAR DAILY 09/30/21  Yes [provider]  apixaban (ELIQUIS) 5 MG TABS tablet Take 1 tablet (5 mg total) by mouth 2 (two) times daily. 05/22/22  Yes Marnee Guarneri T, NP  Cholecalciferol 125 MCG (5000 UT) TABS Take by mouth. 12/13/21 12/13/22 Yes [provider]  Continuous Glucose Monitor Sup KIT 1 each by Does not apply route continuous. 03/01/22  Yes Johnson, Barb Merino, DO  Elastic Bandages & Supports ([place]) MISC by Does not apply route.   Yes Johnson, [name], DO  empagliflozin (JARDIANCE) 25 MG TABS tablet Take 1 tablet (25 mg total) by mouth daily before breakfast. 02/14/22  Yes Johnson, [name], DO  folic acid (FOLVITE) 1 MG tablet Take 1 tablet (1 mg total) by mouth daily. 09/04/21  Yes Johnson, [name], DO  insulin glargine, 2 Unit Dial, (TOUJEO MAX SOLOSTAR) 300 UNIT/ML Solostar Pen Inject 48 Units into the skin at bedtime. 05/20/22 06/19/22 Yes Johnson, [name], DO  Insulin Pen Needle (PEN NEEDLES) 30G X 8 MM MISC 1 each by Does not apply route daily. 09/11/21  Yes Johnson, [name], DO  iron polysaccharides (NIFEREX) 150 MG capsule TAKE 1 CAPSULE(150 MG) BY MOUTH DAILY 02/14/22  Yes Johnson, [name], DO  latanoprost (XALATAN) 0.005 % ophthalmic solution Place 1 drop into both eyes at bedtime.   Yes [provider]  metolazone (ZAROXOLYN) 2.5 MG tablet Take 1 tablet (2.5 mg total) by mouth every Monday, Wednesday, and Friday. 04/19/22  Yes [name], MD  metoprolol succinate (TOPROL-XL) 25 MG 24 hr tablet Take 0.5 tablets (12.5 mg total) by mouth daily. 11/19/21  Yes Johnson, [name], DO  midodrine (PROAMATINE) 10 MG tablet Take 1 tablet (10 mg total) by mouth 3 (three) times daily with meals. 05/22/22  Yes Cannady, Henrine Screws T, NP  Multiple Vitamin (MULTIVITAMIN WITH MINERALS) TABS tablet Take 1 tablet by mouth daily.   Yes [provider]  Niacin (VITAMIN B-3 PO) Take 5,000 Units by mouth once a week.   Yes  [provider]  pantoprazole (PROTONIX) 40 MG tablet Take 1 tablet (40 mg total) by mouth 2 (two) times daily. 09/04/21  Yes Johnson, [name], DO  polyethylene glycol (MIRALAX / GLYCOLAX) 17 g packet Take 17 g by mouth daily. 08/28/21  Yes Fritzi Mandes, MD  potassium chloride SA (KLOR-CON) 20 MEQ tablet Take 2 tablets (40 mEq total) by mouth daily. 09/11/21  Yes Theora Gianotti, NP  spironolactone (ALDACTONE) 25 MG tablet TAKE 1 TABLET(25 MG) BY MOUTH DAILY 05/27/22  Yes Johnson, [name], DO  torsemide (DEMADEX) 20 MG tablet TAKE 1 TO 2 TABLETS BY MOUTH DAILY. ONLY TAKE 2 TABLETS FOR 1 TO 3 DAYS AND IF PERSISTS, CALL WITH BAD SWELLING 10/25/21  Yes Johnson, [name], DO  vitamin B-12 (CYANOCOBALAMIN) 500 MCG tablet Take 1 tablet (500 mcg total) by mouth daily. 09/04/21  Yes Johnson, [name], DO    Review of Systems  Constitutional:  Positive for appetite change (decreased) and fatigue (easily).  HENT:  Negative for congestion, postnasal drip and sore throat.   Eyes: Negative.   Respiratory:  Positive for cough (dry, hacky). Negative for chest tightness and shortness of breath.   Cardiovascular:  Negative for chest pain, palpitations and leg swelling.  Gastrointestinal:  Positive for abdominal distention ("very little"). Negative for abdominal pain.  Endocrine: Negative.   Genitourinary: Negative.   Musculoskeletal:  Positive for arthralgias (knee pain) and back pain. Negative for neck pain.  Skin: Negative.   Allergic/Immunologic: Negative.   Neurological:  Negative for dizziness and light-headedness.  Hematological:  Negative for adenopathy. Does not bruise/bleed easily.  Psychiatric/Behavioral:  Positive for sleep disturbance (not sleeping well). Negative for dysphoric mood. The patient is not nervous/anxious.    Vitals:   06/04/22 1326  BP: 101/72  Pulse: 86  Resp: 14  SpO2: 98%  Weight: 164 lb (74.4 kg)  Height: $Remove'5\' 1"'ydcuGQn$  (1.549 m)   Wt Readings from Last 3 Encounters:   06/04/22 164 lb (74.4 kg)  05/07/22 168 lb (76.2 kg)  03/01/22 171 lb 6.4 oz (77.7 kg)   Lab Results  Component Value Date   CREATININE 2.16 (H) 03/01/2022   CREATININE 2.11 (H) 01/11/2022   CREATININE 2.39 (H) 12/18/2021   Physical Exam Vitals and nursing note reviewed. Exam conducted with a chaperone present (husband).  Constitutional:      Appearance: Normal appearance.  HENT:     Head: Normocephalic and atraumatic.  Cardiovascular:     Rate and Rhythm: Normal rate. Rhythm irregular.  Pulmonary:     Effort: Pulmonary effort is normal. No respiratory distress.     Breath sounds: No wheezing or rales.  Abdominal:     General: There is no distension.     Palpations: Abdomen is soft.  Musculoskeletal:        General: No tenderness.     Cervical back: Normal range of motion and neck supple.     Right lower leg: No edema.     Left lower leg: No edema.  Skin:    General: Skin is warm and dry.  Neurological:     General: No focal deficit present.     Mental Status: She is alert and oriented to person, place, and time.  Psychiatric:        Mood and Affect: Mood normal.        Behavior: Behavior normal.    Assessment & Plan:  1: Chronic heart failure with preserved ejection fraction with structural changes (LVH)- - NYHA class III - euvolemic today - weighing daily; reminded to call for an overnight weight gain of > 2 pounds or a weekly weight gain of > 5 pounds - weight down 20 pounds from last visit here 6 months ago - she says that she's been trying to lose weight because of her uncontrolled diabetes - on GDMT of jardiance although on the $Remo'25mg'KizEl$  dose - renal function may not allow for entresto; has history of hypotension - not adding salt but admits that they don't cook much and tend to eat out often; encouraged her to make low sodium choices when possible - drinking ~ 50 ounces of liquid daily - BNP 08/05/21 was 184.5  2: HTN with CKD- - BP looks good (101/72) - saw  PCP (  Johnson) 03/01/22 - saw nephrology Candiss Norse) 05/27/22 - BMP 05/22/22 reviewed and showed sodium 136, potassium 3.3, creatinine 2.02 and GFR 25  3: DM- - A1c 11/19/21 was 9.6% - home glucose this morning was 134 and then after she eats, no matter what she eats, her glucose can rise to the 300's - currently has a dexcon so can monitor her glucose frequently  4: Atrial fibrillation- - saw cardiology Rockey Situ) 02/22/22 - on apixaban and metoprolol  5: Lymphedema- - resolved  Medication list reviewed.   Due to HF stability will not make a return appointment at this time. Advised patient to follow closely with her other providers and that she could call back at anytime for any questions or to make another appointment. They were comfortable with this plan.

## 2022-06-04 ENCOUNTER — Ambulatory Visit: Payer: Medicare PPO | Attending: Family | Admitting: Family

## 2022-06-04 ENCOUNTER — Encounter: Payer: Self-pay | Admitting: Family

## 2022-06-04 VITALS — BP 101/72 | HR 86 | Resp 14 | Ht 61.0 in | Wt 164.0 lb

## 2022-06-04 DIAGNOSIS — I5032 Chronic diastolic (congestive) heart failure: Secondary | ICD-10-CM

## 2022-06-04 DIAGNOSIS — I130 Hypertensive heart and chronic kidney disease with heart failure and stage 1 through stage 4 chronic kidney disease, or unspecified chronic kidney disease: Secondary | ICD-10-CM | POA: Insufficient documentation

## 2022-06-04 DIAGNOSIS — W19XXXA Unspecified fall, initial encounter: Secondary | ICD-10-CM | POA: Diagnosis not present

## 2022-06-04 DIAGNOSIS — Z7901 Long term (current) use of anticoagulants: Secondary | ICD-10-CM | POA: Insufficient documentation

## 2022-06-04 DIAGNOSIS — Z794 Long term (current) use of insulin: Secondary | ICD-10-CM

## 2022-06-04 DIAGNOSIS — I10 Essential (primary) hypertension: Secondary | ICD-10-CM

## 2022-06-04 DIAGNOSIS — E1122 Type 2 diabetes mellitus with diabetic chronic kidney disease: Secondary | ICD-10-CM

## 2022-06-04 DIAGNOSIS — R5383 Other fatigue: Secondary | ICD-10-CM | POA: Insufficient documentation

## 2022-06-04 DIAGNOSIS — I890 Lymphedema, not elsewhere classified: Secondary | ICD-10-CM

## 2022-06-04 DIAGNOSIS — E785 Hyperlipidemia, unspecified: Secondary | ICD-10-CM | POA: Insufficient documentation

## 2022-06-04 DIAGNOSIS — K5900 Constipation, unspecified: Secondary | ICD-10-CM | POA: Insufficient documentation

## 2022-06-04 DIAGNOSIS — N184 Chronic kidney disease, stage 4 (severe): Secondary | ICD-10-CM | POA: Diagnosis not present

## 2022-06-04 DIAGNOSIS — I4821 Permanent atrial fibrillation: Secondary | ICD-10-CM

## 2022-06-04 NOTE — Patient Instructions (Addendum)
Continue weighing daily and call for an overnight weight gain of 3 pounds or more or a weekly weight gain of more than 5 pounds. ? ? ?If you have voicemail, please make sure your mailbox is cleaned out so that we may leave a message and please make sure to listen to any voicemails.  ? ? ?Call us in the future if you need us for anything ?

## 2022-06-07 ENCOUNTER — Ambulatory Visit: Payer: Medicare PPO | Admitting: Family Medicine

## 2022-06-07 ENCOUNTER — Encounter: Payer: Self-pay | Admitting: Family Medicine

## 2022-06-07 VITALS — BP 100/69 | HR 84 | Temp 97.7°F | Wt 164.9 lb

## 2022-06-07 DIAGNOSIS — Z794 Long term (current) use of insulin: Secondary | ICD-10-CM | POA: Diagnosis not present

## 2022-06-07 DIAGNOSIS — E782 Mixed hyperlipidemia: Secondary | ICD-10-CM | POA: Diagnosis not present

## 2022-06-07 DIAGNOSIS — K5903 Drug induced constipation: Secondary | ICD-10-CM | POA: Diagnosis not present

## 2022-06-07 DIAGNOSIS — E1121 Type 2 diabetes mellitus with diabetic nephropathy: Secondary | ICD-10-CM

## 2022-06-07 DIAGNOSIS — S81801A Unspecified open wound, right lower leg, initial encounter: Secondary | ICD-10-CM

## 2022-06-07 DIAGNOSIS — I129 Hypertensive chronic kidney disease with stage 1 through stage 4 chronic kidney disease, or unspecified chronic kidney disease: Secondary | ICD-10-CM

## 2022-06-07 DIAGNOSIS — Z23 Encounter for immunization: Secondary | ICD-10-CM | POA: Diagnosis not present

## 2022-06-07 LAB — BAYER DCA HB A1C WAIVED: HB A1C (BAYER DCA - WAIVED): 11.3 % — ABNORMAL HIGH (ref 4.8–5.6)

## 2022-06-07 MED ORDER — INSULIN LISPRO 100 UNIT/ML IJ SOLN: 4.0000 [IU] | Freq: Three times a day (TID) | INTRAMUSCULAR | 11 refills | Status: DC

## 2022-06-07 MED ORDER — ROSUVASTATIN CALCIUM 5 MG PO TABS: 5.0000 mg | ORAL_TABLET | ORAL | 0 refills | Status: DC

## 2022-06-07 NOTE — Assessment & Plan Note (Addendum)
Has been hesitant to start statins. Rechecking labs today. Will get her started on 1x a week statin and recheck in 3 months. Call with any concerns.

## 2022-06-07 NOTE — Assessment & Plan Note (Signed)
Under good control on current regimen. Continue current regimen. Continue to monitor. Call with any concerns. Refills given. Labs drawn today.   

## 2022-06-07 NOTE — Patient Instructions (Addendum)
Pre-meal Glucose Level Prandial Insulin Dose  70-150       4 units  150-200       5 units  200-250       6 units  250-300       7 units  300-350       8 units  350-400       9 units  >400        10 units

## 2022-06-07 NOTE — Progress Notes (Signed)
BP 100/69   Pulse 84   Temp 97.7 F (36.5 C)   Wt 164 lb 14.4 oz (74.8 kg)   SpO2 98%   BMI 31.16 kg/m    Subjective:    Patient ID: Karen Farley, female    DOB: 1945-11-02, 76 y.o.   MRN: [id number]  HPI: Karen Farley is a 76 y.o. female  Chief Complaint  Patient presents with   Diabetes         Constipation    Patient states she is not having normal BM, does not go daily. Patient has been using miralax, and prune juice occassionally but has not helped much.    DIABETES- has been using her glucose monitor. Getting more comfortable with it.  Hypoglycemic episodes:no Polydipsia/polyuria: no Visual disturbance: no Chest pain: no Paresthesias: no Glucose Monitoring: yes  Accucheck frequency: continuous- AM 150, goes up to 300 with eating Taking Insulin?: yes Blood Pressure Monitoring: not checking Retinal Examination: Up to Date Foot Exam: Up to Date Diabetic Education: Completed Pneumovax: Up to Date Influenza:  postpone to flu season Aspirin: no  HYPERTENSION / HYPERLIPIDEMIA Satisfied with current treatment? yes Duration of hypertension: chronic BP monitoring frequency: not checking BP medication side effects: no Past BP meds: torsemide, spironalactone, metoprolol, metolazone  Duration of hyperlipidemia: chronic Cholesterol medication side effects: no Cholesterol supplements: none Past cholesterol medications: none Medication compliance: excellent compliance Aspirin: no Recent stressors: no Recurrent headaches: no Visual changes: no Palpitations: no Dyspnea: no Chest pain: no Lower extremity edema: no Dizzy/lightheaded: no  Relevant past medical, surgical, family and social history reviewed and updated as indicated. Interim medical history since our last visit reviewed. Allergies and medications reviewed and updated.  Review of Systems  Constitutional: Negative.   Respiratory: Negative.    Cardiovascular: Negative.   Gastrointestinal:  Negative.   Musculoskeletal: Negative.   Neurological: Negative.   Psychiatric/Behavioral: Negative.      Per HPI unless specifically indicated above     Objective:    BP 100/69   Pulse 84   Temp 97.7 F (36.5 C)   Wt 164 lb 14.4 oz (74.8 kg)   SpO2 98%   BMI 31.16 kg/m   Wt Readings from Last 3 Encounters:  06/07/22 164 lb 14.4 oz (74.8 kg)  06/04/22 164 lb (74.4 kg)  05/07/22 168 lb (76.2 kg)    Physical Exam Vitals and nursing note reviewed.  Constitutional:      General: She is not in acute distress.    Appearance: Normal appearance. She is obese. She is not ill-appearing, toxic-appearing or diaphoretic.  HENT:     Head: Normocephalic and atraumatic.     Right Ear: External ear normal.     Left Ear: External ear normal.     Nose: Nose normal.     Mouth/Throat:     Mouth: Mucous membranes are moist.     Pharynx: Oropharynx is clear.  Eyes:     General: No scleral icterus.       Right eye: No discharge.        Left eye: No discharge.     Extraocular Movements: Extraocular movements intact.     Conjunctiva/sclera: Conjunctivae normal.     Pupils: Pupils are equal, round, and reactive to light.  Cardiovascular:     Rate and Rhythm: Normal rate and regular rhythm.     Pulses: Normal pulses.     Heart sounds: Normal heart sounds. No murmur heard.    No friction rub.  No gallop.  Pulmonary:     Effort: Pulmonary effort is normal. No respiratory distress.     Breath sounds: Normal breath sounds. No stridor. No wheezing, rhonchi or rales.  Chest:     Chest wall: No tenderness.  Musculoskeletal:        General: Normal range of motion.     Cervical back: Normal range of motion and neck supple.  Skin:    General: Skin is warm and dry.     Capillary Refill: Capillary refill takes less than 2 seconds.     Coloration: Skin is not jaundiced or pale.     Findings: No bruising, erythema, lesion or rash.     Comments: Small well healing wound on R leg consistent with bug  bite.  Neurological:     General: No focal deficit present.     Mental Status: She is alert and oriented to person, place, and time. Mental status is at baseline.  Psychiatric:        Mood and Affect: Mood normal.        Behavior: Behavior normal.        Thought Content: Thought content normal.        Judgment: Judgment normal.     Results for orders placed or performed in visit on 06/07/22  Bayer DCA Hb A1c Waived  Result Value Ref Range   HB A1C (BAYER DCA - WAIVED) 11.3 (H) 4.8 - 5.6 %      Assessment & Plan:   Problem List Items Addressed This Visit       Endocrine   Type 2 diabetes mellitus ([place]) - Primary    Not under good control with A1c of 11.4. Needs meal time insulin, but is very hesitant about starting it. Will get her into diabetic education for further teaching regarding meal time insulin. Will start 4units immediately before meals with a sliding scale based on her BS. Has CGM, but is getting used to using it. Will check in with her 2 weeks after her diabetic education visit to adjust her insulin.       Relevant Medications   insulin lispro (HUMALOG) 100 UNIT/ML injection   rosuvastatin (CRESTOR) 5 MG tablet   Other Relevant Orders   Bayer DCA Hb A1c Waived (Completed)   Lipid Panel w/o Chol/HDL Ratio   Comprehensive metabolic panel   CBC with Differential/Platelet   Ambulatory referral to diabetic education     Genitourinary   Benign hypertensive renal disease    Under good control on current regimen. Continue current regimen. Continue to monitor. Call with any concerns. Refills given. Labs drawn today.       Relevant Orders   CBC with Differential/Platelet     Other   Hyperlipidemia    Has been hesitant to start statins. Rechecking labs today. Will get her started on 1x a week statin and recheck in 3 months. Call with any concerns.        Relevant Medications   rosuvastatin (CRESTOR) 5 MG tablet   Other Relevant Orders   CBC with  Differential/Platelet   Other Visit Diagnoses     Drug-induced constipation       Likely multifactorial. Continue hydration. Will check CBC to see about stopping iron. Await results.    Relevant Orders   CBC with Differential/Platelet   Open wound of right lower extremity, initial encounter       Healing well. Due for Td. Given today.   Relevant Orders   Td :  Tetanus/diphtheria >7yo Preservative  free (Completed)        Follow up plan: Return for 2 weeks after appointment with lifestyle [facility].  >30 minutes spent with patient today.

## 2022-06-07 NOTE — Assessment & Plan Note (Signed)
Not under good control with A1c of 11.4. Needs meal time insulin, but is very hesitant about starting it. Will get her into diabetic education for further teaching regarding meal time insulin. Will start 4units immediately before meals with a sliding scale based on her BS. Has CGM, but is getting used to using it. Will check in with her 2 weeks after her diabetic education visit to adjust her insulin.

## 2022-06-08 LAB — CBC WITH DIFFERENTIAL/PLATELET
Basophils Absolute: 0.1 10*3/uL (ref 0.0–0.2)
Basos: 1 %
EOS (ABSOLUTE): 0.1 10*3/uL (ref 0.0–0.4)
Eos: 1 %
Hematocrit: 50.2 % — ABNORMAL HIGH (ref 34.0–46.6)
Hemoglobin: 16.2 g/dL — ABNORMAL HIGH (ref 11.1–15.9)
Immature Grans (Abs): 0 10*3/uL (ref 0.0–0.1)
Immature Granulocytes: 0 %
Lymphocytes Absolute: 0.5 10*3/uL — ABNORMAL LOW (ref 0.7–3.1)
Lymphs: 7 %
MCH: 28.2 pg (ref 26.6–33.0)
MCHC: 32.3 g/dL (ref 31.5–35.7)
MCV: 87 fL (ref 79–97)
Monocytes Absolute: 0.5 10*3/uL (ref 0.1–0.9)
Monocytes: 7 %
Neutrophils Absolute: 6.1 10*3/uL (ref 1.4–7.0)
Neutrophils: 84 %
Platelets: 326 10*3/uL (ref 150–450)
RBC: 5.75 x10E6/uL — ABNORMAL HIGH (ref 3.77–5.28)
RDW: 15.7 % — ABNORMAL HIGH (ref 11.7–15.4)
WBC: 7.3 10*3/uL (ref 3.4–10.8)

## 2022-06-08 LAB — COMPREHENSIVE METABOLIC PANEL
ALT: 17 IU/L (ref 0–32)
AST: 17 IU/L (ref 0–40)
Albumin/Globulin Ratio: 1.6 (ref 1.2–2.2)
Albumin: 4.1 g/dL (ref 3.8–4.8)
Alkaline Phosphatase: 117 IU/L (ref 44–121)
BUN/Creatinine Ratio: 22 (ref 12–28)
BUN: 38 mg/dL — ABNORMAL HIGH (ref 8–27)
Bilirubin Total: 0.8 mg/dL (ref 0.0–1.2)
CO2: 26 mmol/L (ref 20–29)
Calcium: 10.1 mg/dL (ref 8.7–10.3)
Chloride: 91 mmol/L — ABNORMAL LOW (ref 96–106)
Creatinine, Ser: 1.74 mg/dL — ABNORMAL HIGH (ref 0.57–1.00)
Globulin, Total: 2.5 g/dL (ref 1.5–4.5)
Glucose: 294 mg/dL — ABNORMAL HIGH (ref 70–99)
Potassium: 3.8 mmol/L (ref 3.5–5.2)
Sodium: 137 mmol/L (ref 134–144)
Total Protein: 6.6 g/dL (ref 6.0–8.5)
eGFR: 30 mL/min/{1.73_m2} — ABNORMAL LOW (ref >59)

## 2022-06-08 LAB — LIPID PANEL W/O CHOL/HDL RATIO
Cholesterol, Total: 246 mg/dL — ABNORMAL HIGH (ref 100–199)
HDL: 45 mg/dL (ref >39)
LDL Chol Calc (NIH): 176 mg/dL — ABNORMAL HIGH (ref 0–99)
Triglycerides: 137 mg/dL (ref 0–149)
VLDL Cholesterol Cal: 25 mg/dL (ref 5–40)

## 2022-06-11 ENCOUNTER — Telehealth: Payer: Medicare PPO

## 2022-06-12 ENCOUNTER — Ambulatory Visit: Payer: Self-pay | Admitting: *Deleted

## 2022-06-12 NOTE — Telephone Encounter (Signed)
Please let her know that I agree with what the nurse told her. It may have to do with what she ate for dinner last night. We'll see what happens with the nutrionist on Friday.

## 2022-06-12 NOTE — Telephone Encounter (Signed)
Reason for Disposition  Low blood sugar definition and treatment, questions about  [1] Blood glucose 70 mg/dl (3.9 mmol/l) or below, OR symptomatic AND [2] cause known  Answer Assessment - Initial Assessment Questions 1. SYMPTOMS: "What symptoms are you concerned about?"     Low glucose reading last night- shaky, hot 2. ONSET:  "When did the symptoms start?"     11:00pm 3. BLOOD GLUCOSE: "What is your blood glucose level?"      242 4. USUAL RANGE: "What is your blood glucose level usually?" (e.g., usual fasting morning value, usual evening value)     130-140 fasting- varies 5. TYPE 1 or 2:  "Do you know what type of diabetes you have?"  (e.g., Type 1, Type 2, Gestational; doesn't know)      Type 2 6. INSULIN: "Do you take insulin?" "What type of insulin(s) do you use? What is the mode of delivery? (syringe, pen; injection or pump) "When did you last give yourself an insulin dose?" (i.e., time or hours/minutes ago) "How much did you give?" (i.e., how many units)     48 units- Sola star  7. DIABETES PILLS: "Do you take any pills for your diabetes?" If Yes, ask: "What is the name of the medicine(s) that you take for high blood sugar?"     Jardiance 8. OTHER SYMPTOMS: "Do you have any symptoms?" (e.g., fever, frequent urination, difficulty breathing, vomiting)     no 9. LOW BLOOD GLUCOSE TREATMENT: "What have you done so far to treat the low blood glucose level?"     Patient was able to eat/drink high glucose 10. FOOD: "When did you last eat or drink?"       No breakfast this morning 11. ALONE: "Are you alone right now or is someone with you?"        Patient has husband in the home and helped monitor  Protocols used: Diabetes - Low Blood Sugar-A-AH

## 2022-06-12 NOTE — Telephone Encounter (Signed)
Patient verbalized understanding. No further questions.

## 2022-06-12 NOTE — Telephone Encounter (Signed)
  Chief Complaint: low glucose last night Symptoms: shaky, hot last night- glucose went low- 55/42- was able to bring it up with honey, sugar drink- today 242- has not taken medication or eaten yet Frequency: last night only- first time low- reports no change in activity or diet yesterday Pertinent Negatives: Patient denies symptoms today Disposition: '[]'$ ED /'[]'$ Urgent Care (no appt availability in office) / '[]'$ Appointment(In office/virtual)/ '[]'$  [facility]/ '[x]'$ Home Care/ '[]'$ Refused Recommended Disposition /'[]'$  Mobile Bus/ '[]'$  Follow-up with PCP Additional Notes: Reviewed protocol for low glucose- what to= take and time line- patient has appointment at [facility]  on Friday for instruction. Patient has never had this happen and was a little surprise- reassure patient- advised eat breakfast and take normal medications- she will monitor and call office with any concerns today. Patient advised would send message to PCP about her low level episode for PCP review and advise

## 2022-06-15 ENCOUNTER — Other Ambulatory Visit: Payer: Self-pay | Admitting: Family Medicine

## 2022-06-17 NOTE — Telephone Encounter (Signed)
Requested Prescriptions  Pending Prescriptions Disp Refills  . potassium chloride SA (KLOR-CON M) 20 MEQ tablet [Pharmacy Med Name: POTASSIUM CL 20MEQ ER TABLETS] 340 tablet     Sig: TAKE 2 TABLETS BY MOUTH TWICE DAILY     Endocrinology:  Minerals - Potassium Supplementation Failed - 06/15/2022  3:33 PM      Failed - Cr in normal range and within 360 days    Creatinine, Ser  Date Value Ref Range Status  06/07/2022 1.74 (H) 0.57 - 1.00 mg/dL Final   Creatinine, Urine  Date Value Ref Range Status  04/11/2021 139 mg/dL Final         Passed - K in normal range and within 360 days    Potassium  Date Value Ref Range Status  06/07/2022 3.8 3.5 - 5.2 mmol/L Final         Passed - Valid encounter within last 12 months    Recent Outpatient Visits          1 week ago Type 2 diabetes mellitus with diabetic nephropathy, with long-term current use of insulin ([place])   [place], [name], DO   3 months ago Type 2 diabetes mellitus with diabetic nephropathy, with long-term current use of insulin ([place])   [place], [name], DO   4 months ago Type 2 diabetes mellitus with diabetic nephropathy, with long-term current use of insulin ([place])   [place], [name], DO   4 months ago Type 2 diabetes mellitus with diabetic nephropathy, with long-term current use of insulin ([place])   [place], [name], DO   4 months ago Type 2 diabetes mellitus with diabetic nephropathy, with long-term current use of insulin ([place])   [place], [place], DO      Future Appointments            In 2 months Gollan, Kathlene November, MD [place] [place]

## 2022-06-20 DIAGNOSIS — E1122 Type 2 diabetes mellitus with diabetic chronic kidney disease: Secondary | ICD-10-CM | POA: Diagnosis not present

## 2022-06-20 DIAGNOSIS — Z794 Long term (current) use of insulin: Secondary | ICD-10-CM

## 2022-06-20 DIAGNOSIS — N184 Chronic kidney disease, stage 4 (severe): Secondary | ICD-10-CM

## 2022-06-20 DIAGNOSIS — I509 Heart failure, unspecified: Secondary | ICD-10-CM | POA: Diagnosis not present

## 2022-06-25 ENCOUNTER — Other Ambulatory Visit: Payer: Self-pay | Admitting: Family Medicine

## 2022-06-26 NOTE — Telephone Encounter (Signed)
Requested Prescriptions  Pending Prescriptions Disp Refills  . ELIQUIS 5 MG TABS tablet [Pharmacy Med Name: ELIQUIS 5 MG TAB] 180 tablet 3    Sig: TAKE 1 TABLET BY MOUTH TWICE DAILY     Hematology:  Anticoagulants - apixaban Failed - 06/26/2022  4:11 PM      Failed - HGB in normal range and within 360 days    Hemoglobin  Date Value Ref Range Status  06/07/2022 16.2 (H) 11.1 - 15.9 g/dL Final         Failed - HCT in normal range and within 360 days    Hematocrit  Date Value Ref Range Status  06/07/2022 50.2 (H) 34.0 - 46.6 % Final         Failed - Cr in normal range and within 360 days    Creatinine, Ser  Date Value Ref Range Status  06/07/2022 1.74 (H) 0.57 - 1.00 mg/dL Final   Creatinine, Urine  Date Value Ref Range Status  04/11/2021 139 mg/dL Final         Passed - PLT in normal range and within 360 days    Platelets  Date Value Ref Range Status  06/07/2022 326 150 - 450 x10E3/uL Final         Passed - AST in normal range and within 360 days    AST  Date Value Ref Range Status  06/07/2022 17 0 - 40 IU/L Final         Passed - ALT in normal range and within 360 days    ALT  Date Value Ref Range Status  06/07/2022 17 0 - 32 IU/L Final         Passed - Valid encounter within last 12 months    Recent Outpatient Visits          2 weeks ago Type 2 diabetes mellitus with diabetic nephropathy, with long-term current use of insulin ([place])   [place], [name], DO   3 months ago Type 2 diabetes mellitus with diabetic nephropathy, with long-term current use of insulin ([place])   [facility] [place], [name], DO   4 months ago Type 2 diabetes mellitus with diabetic nephropathy, with long-term current use of insulin ([place])   [facility] [place], [name], DO   4 months ago Type 2 diabetes mellitus with diabetic nephropathy, with long-term current use of insulin ([place])   [facility] [place], [name], DO   5 months ago  Type 2 diabetes mellitus with diabetic nephropathy, with long-term current use of insulin ([place])   [facility] [place], [place], DO      Future Appointments            In 2 months Gollan, Kathlene November, MD [place]. [place]           Provider reviewed labs.

## 2022-06-28 ENCOUNTER — Encounter: Payer: Medicare PPO | Attending: Family Medicine | Admitting: *Deleted

## 2022-06-28 ENCOUNTER — Telehealth: Payer: Self-pay | Admitting: Family Medicine

## 2022-06-28 ENCOUNTER — Encounter: Payer: Self-pay | Admitting: *Deleted

## 2022-06-28 ENCOUNTER — Other Ambulatory Visit: Payer: Self-pay | Admitting: Cardiovascular Disease

## 2022-06-28 VITALS — BP 108/62 | Ht 61.0 in | Wt 162.5 lb

## 2022-06-28 DIAGNOSIS — E1165 Type 2 diabetes mellitus with hyperglycemia: Secondary | ICD-10-CM

## 2022-06-28 DIAGNOSIS — E1121 Type 2 diabetes mellitus with diabetic nephropathy: Secondary | ICD-10-CM | POA: Diagnosis not present

## 2022-06-28 DIAGNOSIS — Z794 Long term (current) use of insulin: Secondary | ICD-10-CM | POA: Insufficient documentation

## 2022-06-28 MED ORDER — INSULIN SYRINGE 31G X 5/16" 1 ML MISC
1.0000 | Freq: Three times a day (TID) | 12 refills | Status: DC
Start: 2022-06-28 — End: 2022-12-13

## 2022-06-28 NOTE — Telephone Encounter (Signed)
We can send a rx for syringes.

## 2022-06-28 NOTE — Telephone Encounter (Signed)
PT came in to the office stating that provider filled her NovoLog but there was not any syringes.  PT was upset that she had to pay for the syringes, rather than them coming with it.  Requested that she get insulin in pen form.  Please advise.

## 2022-06-28 NOTE — Progress Notes (Signed)
Diabetes Self-Management Education  Visit Type: First/Initial  Appt. Start Time: 1320 Appt. End Time: 9381  06/28/2022  [name], identified by name and date of birth, is a 76 y.o. female with a diagnosis of Diabetes: Type 2.   ASSESSMENT  Blood pressure 108/62, height '5\' 1"'$  (1.549 m), weight 162 lb 8 oz (73.7 kg). Body mass index is 30.7 kg/m.   Diabetes Self-Management Education - 06/28/22 1549       Visit Information   Visit Type First/Initial      Initial Visit   Diabetes Type Type 2    Date Diagnosed 5 plus years    Are you currently following a meal plan? Yes    What type of meal plan do you follow? "trying to watch what I eat because of my glucose"    Are you taking your medications as prescribed? No   She has not started Novolog or Crestor yet.     Health Coping   How would you rate your overall health? Good;Fair      Psychosocial Assessment   Patient Belief/Attitude about Diabetes Other (comment)   "a pain in the butt to deal with"   What is the hardest part about your diabetes right now, causing you the most concern, or is the most worrisome to you about your diabetes?   Making healty food and beverage choices    Self-care barriers Unsteady gait/risk for falls    Self-management support Doctor's office;Family    Other persons present Spouse/SO    Patient Concerns Nutrition/Meal planning;Glycemic Control;Medication;Monitoring;Healthy Lifestyle    Special Needs None    Preferred Learning Style Auditory;Visual;Hands on    Seminary in progress    How often do you need to have someone help you when you read instructions, pamphlets, or other written materials from your doctor or pharmacy? 1 - Never    What is the last grade level you completed in school? 2 years community college      Pre-Education Assessment   Patient understands the diabetes disease and treatment process. Comprehends key points    Patient understands incorporating  nutritional management into lifestyle. Needs Review    Patient undertands incorporating physical activity into lifestyle. Needs Review    Patient understands using medications safely. Comprehends key points    Patient understands monitoring blood glucose, interpreting and using results Comprehends key points    Patient understands prevention, detection, and treatment of acute complications. Needs Review    Patient understands prevention, detection, and treatment of chronic complications. Compreheands key points    Patient understands how to develop strategies to address psychosocial issues. Comprehends key points    Patient understands how to develop strategies to promote health/change behavior. Comprehends key points      Complications   Last HgB A1C per patient/outside source 11.3 %   06/07/2022   How often do you check your blood sugar? 1-2 times/day   She has Dexcom but also checks her blood sugars with glucometer. 30 day avg 254; Very High 43%, High 31%, TIR 24%, Low 1%, Very Low 1%; 14 day avg 217; Very High 30%, High 33%, TIR 35%, Low 1%, Very Low 1%   Fasting Blood glucose range (mg/dL) 70-129;130-179;180-200;>200   FBG 112-291 mg/dL.   Number of hypoglycemic episodes per month 1   42 around 11:30 pm   Can you tell when your blood sugar is low? Yes   shaky   What do you do if your blood sugar is  low? reports 1st one she has experienced - sugar and honey, chocolate, food    Have you had a dilated eye exam in the past 12 months? Yes    Have you had a dental exam in the past 12 months? Yes    Are you checking your feet? Yes    How many days per week are you checking your feet? 7      Dietary Intake   Breakfast grits or granola and almond milk; occasional omelet    Snack (morning) reports no snacks or may have fruit    Lunch apple and peanut butter, celery and humus, cottage cheese with cumin and olives, fruit (orange, apple, cherries, pineapple, blueberries)    Dinner chicken, salmon;  occasional potatoes, beans, pasta salad, brussels sprouts, green beans, salads with lettuce, kale, cabbage, broccoli, cauliflower, fruit, cuccumbers, tomatoes, eggs    Beverage(s) water      Activity / Exercise   Activity / Exercise Type Light (walking / raking leaves)   [facility]     Patient Education   Previous Diabetes Education Yes (please comment)   [facility]   Disease Pathophysiology Factors that contribute to the development of diabetes;Explored patient's options for treatment of their diabetes    Healthy Eating Role of diet in the treatment of diabetes and the relationship between the three main macronutrients and blood glucose level;Food label reading, portion sizes and measuring food.;Carbohydrate counting;Reviewed blood glucose goals for pre and post meals and how to evaluate the patients' food intake on their blood glucose level.;Meal timing in regards to the patients' current diabetes medication.    Being Active Role of exercise on diabetes management, blood pressure control and cardiac health.    Medications Taught/reviewed insulin/injectables, injection, site rotation, insulin/injectables storage and needle disposal.;Reviewed patients medication for diabetes, action, purpose, timing of dose and side effects; Instructed on using vial and syringe. Pt has NovoLog vial but no syringes. She will discuss with pharmacy about obtaining a pen.    Monitoring Taught/evaluated CGM (comment);Purpose and frequency of SMBG.;Taught/discussed recording of test results and interpretation of SMBG.;Identified appropriate SMBG and/or A1C goals.;Other (comment)   Discussed GFR labs   Acute complications Taught prevention, symptoms, and  treatment of hypoglycemia - the 15 rule.    Chronic complications Relationship between chronic complications and blood glucose control;Nephropathy, what it is, prevention of, the use of ACE, ARB's and early detection of through urine  microalbumia.;Identified and discussed with patient  current chronic complications    Diabetes Stress and Support Identified and addressed patients feelings and concerns about diabetes      Individualized Goals (developed by patient)   Reducing Risk Other (comment)   improve blood sugars, decrease medications, prevent diabetes complications, lead a healthier lifestyle, become more fit     Outcomes   Expected Outcomes Demonstrated interest in learning. Expect positive outcomes    Future DMSE 4-6 wks         Individualized Plan for Diabetes Self-Management Training:   Learning Objective:  Patient will have a greater understanding of diabetes self-management. Patient education plan is to attend individual and/or group sessions per assessed needs and concerns.   Plan:   Patient Instructions  Check blood sugars before each meal, 2 hours after meals, before bed  every day and as needed with Dexcom  Bring blood sugar records to the next appointment  Exercise:  Do water aerobics for    60-90  minutes   3  days  a week  Eat 3 meals day,   1  snack a day Space meals 4-6 hours apart Include 1 serving of protein when eating fruit for a snack Bring food labels to next appointment  Complete 3 Day Food Record and bring to next appt  Carry fast acting glucose and a snack at all times Rotate injection sites Hold insulin pen in place for 5-10 seconds after injection  Return for appointment on:   Friday July 26, 2022 at 1:15 pm with Freda Munro (nurse)  Expected Outcomes:  Demonstrated interest in learning. Expect positive outcomes  Education material provided:  General Meal Planning Guidelines Simple Meal Plan 3 Day Food Record Symptoms, causes and treatments of Hypoglycemia Injection Guide (BD) Quick and Balanced Meals AVS for Dexcom CGM  If problems or questions, patient to contact team via:   Johny Drilling, RN, [place] [phone]  Future DSME appointment: 4-6 wks July 26, 2022 with this educator

## 2022-06-28 NOTE — Patient Instructions (Addendum)
Check blood sugars before each meal, 2 hours after meals, before bed  every day and as needed with Dexcom  Bring blood sugar records to the next appointment  Exercise:  Do water aerobics for    60-90  minutes   3  days a week  Eat 3 meals day,   1  snack a day Space meals 4-6 hours apart Include 1 serving of protein when eating fruit for a snack Bring food labels to next appointment  Complete 3 Day Food Record and bring to next appt  Carry fast acting glucose and a snack at all times Rotate injection sites Hold insulin pen in place for 5-10 seconds after injection  Return for appointment on:   Friday July 26, 2022 at 1:15 pm with Freda Munro (nurse)

## 2022-06-28 NOTE — Telephone Encounter (Signed)
Per patient she is OK with getting syringes and needles called in to [facility]

## 2022-07-01 ENCOUNTER — Telehealth: Payer: Self-pay | Admitting: *Deleted

## 2022-07-01 NOTE — Telephone Encounter (Signed)
Phone call to follow up with patient regarding NovoLog injections. She reported that she has charted her blood sugars for the last 3 days using Dexcom CGM. Fasting blood sugars were 142, 129 and 126 mg/dL. Readings after breakfast, before and after lunch, before and after supper are 190's-280 mg/dL. She went to her pharmacy to get some syringes and was trying to get an Insulin pen. The pharmacy reported her insurance would not cover. She will start with 4 units NovoLog before meals. Pt reports she is very nervous about being on another insulin. MD also wrote for sliding scale above 150 mg/dL. She reports that she will start with 4 units first and get adjusted to it before attempting sliding scale. Will follow with her later this week.

## 2022-07-02 ENCOUNTER — Telehealth: Payer: Medicare PPO

## 2022-07-03 ENCOUNTER — Other Ambulatory Visit: Payer: Self-pay | Admitting: Family Medicine

## 2022-07-04 NOTE — Telephone Encounter (Signed)
Requested medication (s) are due for refill today: historical provider  Requested medication (s) are on the active medication list: yes  Last refill:  na  Future visit scheduled: no  Notes to clinic:  historical medication . Do you want to order Rx?     Requested Prescriptions  Pending Prescriptions Disp Refills   TOUJEO MAX SOLOSTAR 300 UNIT/ML Solostar Pen [Pharmacy Med Name: TOUJEO MAX SOLOSTAR 300 UNIT/ML SUB] 9 mL     Sig: INJECT 48 UNITS SUBCUTANEOUSLY AT BEDTIME     Endocrinology:  Diabetes - Insulins Failed - 07/03/2022  1:00 PM      Failed - HBA1C is between 0 and 7.9 and within 180 days    Hemoglobin A1C  Date Value Ref Range Status  06/28/2016 6.6  Final   HB A1C (BAYER DCA - WAIVED)  Date Value Ref Range Status  06/07/2022 11.3 (H) 4.8 - 5.6 % Final    Comment:             Prediabetes: 5.7 - 6.4          Diabetes: >6.4          Glycemic control for adults with diabetes: <7.0          Passed - Valid encounter within last 6 months    Recent Outpatient Visits           3 weeks ago Type 2 diabetes mellitus with diabetic nephropathy, with long-term current use of insulin ([place])   [facility] [place], [name], DO   4 months ago Type 2 diabetes mellitus with diabetic nephropathy, with long-term current use of insulin ([place])   [facility] [place], [name], DO   4 months ago Type 2 diabetes mellitus with diabetic nephropathy, with long-term current use of insulin ([place])   [facility] [place], [name], DO   5 months ago Type 2 diabetes mellitus with diabetic nephropathy, with long-term current use of insulin ([place])   [place], [name], DO   5 months ago Type 2 diabetes mellitus with diabetic nephropathy, with long-term current use of insulin ([place])   [place], [place], DO       Future Appointments             In 1 month Gollan, Kathlene November, MD [place]. [place]

## 2022-07-05 ENCOUNTER — Telehealth: Payer: Self-pay | Admitting: *Deleted

## 2022-07-05 ENCOUNTER — Telehealth: Payer: Medicare PPO

## 2022-07-05 NOTE — Telephone Encounter (Signed)
Phone call to patient to see if she started the NovoLog yet. She reports that the pharmacy gave her the wrong syringes. She is waiting to pick up more medication at the pharmacy and will obtain the right ones. During further discussion the pharmacy gave her 100 unit syringes. Explained that she could use these but each line is 2 units instead of 1 unit. She wants to get the 30 or 50 unit syringe before she starts meal time insulin. Instructed her to call back for any questions.

## 2022-07-10 ENCOUNTER — Other Ambulatory Visit: Payer: Self-pay | Admitting: Family Medicine

## 2022-07-10 NOTE — Telephone Encounter (Signed)
Medication Refill - Medication: potassium chloride SA (KLOR-CON M) 20 MEQ tablet   Has the patient contacted their pharmacy? Yes.   (Agent: If no, request that the patient contact the pharmacy for the refill. If patient does not wish to contact the pharmacy document the reason why and proceed with request.) (Agent: If yes, when and what did the pharmacy advise?) Pt is not dealing with [facility] anymore, changed pharmacies. States had told office she would no longer be using them, is upset with them. Please recall this script from 8/28 as she never picked it up. She does not plan to go back there.   Preferred Pharmacy (with phone number or street name):  [place], [address]  [place] [address]  Phone: [phone] Fax: [phone]   Has the patient been seen for an appointment in the last year OR does the patient have an upcoming appointment? Yes.    Agent: Please be advised that RX refills may take up to 3 business days. We ask that you follow-up with your pharmacy.

## 2022-07-11 NOTE — Telephone Encounter (Signed)
Please send prescription to [facility] per patient request. Sent to wrong pharmacy.

## 2022-07-15 DIAGNOSIS — E1129 Type 2 diabetes mellitus with other diabetic kidney complication: Secondary | ICD-10-CM | POA: Diagnosis not present

## 2022-07-15 MED ORDER — POTASSIUM CHLORIDE CRYS ER 20 MEQ PO TBCR
40.0000 meq | EXTENDED_RELEASE_TABLET | Freq: Two times a day (BID) | ORAL | 0 refills | Status: DC
Start: 2022-07-15 — End: 2022-09-04

## 2022-07-17 ENCOUNTER — Other Ambulatory Visit: Payer: Self-pay | Admitting: Family Medicine

## 2022-07-17 NOTE — Telephone Encounter (Signed)
Requested Prescriptions  Pending Prescriptions Disp Refills  . folic acid (FOLVITE) 1 MG tablet [Pharmacy Med Name: FOLIC ACID 1 MG TAB] 90 tablet 2    Sig: TAKE 1 TABLET BY MOUTH ONCE DAILY     Endocrinology:  Vitamins Passed - 07/17/2022  5:13 PM      Passed - Valid encounter within last 12 months    Recent Outpatient Visits          1 month ago Type 2 diabetes mellitus with diabetic nephropathy, with long-term current use of insulin ([place])   [place], [name], DO   4 months ago Type 2 diabetes mellitus with diabetic nephropathy, with long-term current use of insulin ([place])   [place], [name], DO   5 months ago Type 2 diabetes mellitus with diabetic nephropathy, with long-term current use of insulin ([place])   [place], [name], DO   5 months ago Type 2 diabetes mellitus with diabetic nephropathy, with long-term current use of insulin ([place])   [place], [name], DO   5 months ago Type 2 diabetes mellitus with diabetic nephropathy, with long-term current use of insulin ([place])   [place], [name], DO      Future Appointments            In 6 days  [facility]   In 1 month Gollan, Kathlene November, MD [place]. [place]

## 2022-07-23 ENCOUNTER — Ambulatory Visit (INDEPENDENT_AMBULATORY_CARE_PROVIDER_SITE_OTHER): Payer: Medicare PPO | Admitting: *Deleted

## 2022-07-23 DIAGNOSIS — Z0000 Encounter for general adult medical examination without abnormal findings: Secondary | ICD-10-CM

## 2022-07-23 NOTE — Progress Notes (Signed)
Subjective:   Karen Farley is a 76 y.o. female who presents for Medicare Annual (Subsequent) preventive examination.  I connected with  [name] on 07/23/22 by a telephone enabled telemedicine application and verified that I am speaking with the correct person using two identifiers.   I discussed the limitations of evaluation and management by telemedicine. The patient expressed understanding and agreed to proceed.  Patient location: home  Provider location: Tele-health-home   Review of Systems     Cardiac Risk Factors include: advanced age (>106mn, >>37women);diabetes mellitus;family history of premature cardiovascular disease;hypertension     Objective:    Today's Vitals   There is no height or weight on file to calculate BMI.     07/23/2022   12:04 PM 12/18/2021   12:49 PM 10/30/2021    8:36 AM 10/09/2021    6:48 AM 07/27/2021    4:32 PM 07/26/2021    5:05 PM 07/17/2021    7:22 PM  Advanced Directives  Does Patient Have a Medical Advance Directive? Yes No Yes Yes Yes Yes Yes  Type of AMaterials engineerof APearl RiverLiving will HTracy CityLiving will HHardinLiving will HCarrickLiving will   Does patient want to make changes to medical advance directive?   No - Patient declined No - Patient declined No - Patient declined  Yes (ED - Information included in AVS)  Copy of H[place]in Chart? Yes - validated most recent copy scanned in chart (See row information)  No - copy requested Yes - validated most recent copy scanned in chart (See row information) No - copy requested No - copy requested   Would patient like information on creating a medical advance directive?  No - Patient declined         Current Medications (verified) Outpatient Encounter Medications as of 07/23/2022  Medication Sig   ACCU-CHEK GUIDE test strip USE TO CHECK BLOOD  SUGAR DAILY   Cholecalciferol 125 MCG (5000 UT) TABS Take by mouth.   ELIQUIS 5 MG TABS tablet TAKE 1 TABLET BY MOUTH TWICE DAILY   empagliflozin (JARDIANCE) 25 MG TABS tablet Take 1 tablet (25 mg total) by mouth daily before breakfast.   folic acid (FOLVITE) 1 MG tablet TAKE 1 TABLET BY MOUTH ONCE DAILY   Insulin Pen Needle (PEN NEEDLES) 30G X 8 MM MISC 1 each by Does not apply route daily.   Insulin Syringe-Needle U-100 (INSULIN SYRINGE 1CC/31GX5/16") 31G X 5/16" 1 ML MISC 1 each by Does not apply route 3 (three) times daily.   iron polysaccharides (NIFEREX) 150 MG capsule TAKE 1 CAPSULE(150 MG) BY MOUTH DAILY   latanoprost (XALATAN) 0.005 % ophthalmic solution Place 1 drop into both eyes at bedtime.   metolazone (ZAROXOLYN) 2.5 MG tablet Take 1 tablet (2.5 mg total) by mouth every Monday, Wednesday, and Friday.   metoprolol succinate (TOPROL-XL) 25 MG 24 hr tablet Take 0.5 tablets (12.5 mg total) by mouth daily.   midodrine (PROAMATINE) 10 MG tablet Take 1 tablet (10 mg total) by mouth 3 (three) times daily with meals.   Multiple Vitamin (MULTIVITAMIN WITH MINERALS) TABS tablet Take 1 tablet by mouth every other day.   pantoprazole (PROTONIX) 40 MG tablet Take 1 tablet (40 mg total) by mouth 2 (two) times daily.   potassium chloride SA (KLOR-CON M) 20 MEQ tablet Take 2 tablets (40 mEq total) by mouth 2 (two) times daily.  spironolactone (ALDACTONE) 25 MG tablet TAKE 1 TABLET(25 MG) BY MOUTH DAILY   torsemide (DEMADEX) 20 MG tablet TAKE 1 TO 2 TABLETS BY MOUTH DAILY. ONLY TAKE 2 TABLETS FOR 1 TO 3 DAYS AND IF PERSISTS, CALL WITH BAD SWELLING   TOUJEO MAX SOLOSTAR 300 UNIT/ML Solostar Pen INJECT 48 UNITS SUBCUTANEOUSLY AT BEDTIME   vitamin B-12 (CYANOCOBALAMIN) 500 MCG tablet Take 1 tablet (500 mcg total) by mouth daily.   insulin aspart (NOVOLOG) 100 UNIT/ML injection Inject 4 Units into the skin 3 (three) times daily with meals. Increase 1 unit every 50 points above 150. (Patient not taking:  Reported on 06/28/2022)   rosuvastatin (CRESTOR) 5 MG tablet Take 1 tablet (5 mg total) by mouth once a week. (Patient not taking: Reported on 06/28/2022)   No facility-administered encounter medications on file as of 07/23/2022.    Allergies (verified) Patient has no known allergies.   History: Past Medical History:  Diagnosis Date   A-fib ([place])    CHF (congestive heart failure) (HCC)    Chronic heart failure with preserved ejection fraction (HFpEF) ([facility])    a. 11/2019 Echo: EF 60-65%; b. 07/2021 Echo: EF 60-65%.   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 07/2021 req 2u prbcs. Eval deferred given resolution of bleeding and cardiac issues.   Hyperlipidemia    Hypertension    Lymphedema    Morbid obesity ([place])    Osteoarthritis of both knees    Osteopenia    Pericardial effusion    a. 07/2021 Echo: Nl EF. Large pericardial effusion-->s/p pericardiocentesis of 782m; b. 08/23/2021 Echo: EF 60-65%, mild LVH, Nl RV fxn, sev dil LA, moderate pericardial effusion. L pleural effusion.   Permanent atrial fibrillation (HCC)    Pleural effusion, left    a. 07/2021 s/p thoracentesis - 6073m   Past Surgical History:  Procedure Laterality Date   CATARACT EXTRACTION W/PHACO Left 10/09/2021   Procedure: CATARACT EXTRACTION PHACO AND INTRAOCULAR LENS PLACEMENT (IOC) LEFT DIABETIC 4.84 00:37.8;  Surgeon: PoBirder RobsonMD;  [facility] Op[facility];  Laterality: Left;  Diabetic   CATARACT EXTRACTION W/PHACO Right 10/30/2021   Procedure: CATARACT EXTRACTION PHACO AND INTRAOCULAR LENS PLACEMENT (IOC) RIGHT DIABETIC 5.65 00:38.5;  Surgeon: PoBirder RobsonMD;  [place] [facility]: Ophthal[place];  Laterality: Right;  Diabetic   CHOLECYSTECTOMY      DILATION AND CURETTAGE OF UTERUS     IR FLUORO GUIDE CV LINE RIGHT  08/16/2021   IR THORACENTESIS ASP PLEURAL SPACE W/IMG GUIDE  08/16/2021   PERICARDIOCENTESIS N/A 08/05/2021   Procedure: PERICARDIOCENTESIS;  Surgeon: PaIsaias CowmanMD;  [facility];  Service: Cardiovascular;  Laterality: N/A;   TEAR DUCT PROBING WITH STRABISMUS REPAIR Right    TONSILLECTOMY     Family History  Problem Relation Age of Onset   Heart disease Mother        CHF   Osteoporosis Mother    Breast cancer Mother    Heart disease Father [id number] Cancer Brother        Colon CA- 2002- Youngest brother   Parkinson's disease Brother        Younger Brother   Heart disease Brother        2  brothers   Diabetes Brother    Cancer Brother    Breast cancer Maternal Grandmother    Social History   Socioeconomic History   Marital status: Married    Spouse name: Not on file   Number of children: Not on file   Years of education: Not on file   Highest education level: Not on file  Occupational History   Not on file  Tobacco Use   Smoking status: Never   Smokeless tobacco: Never  Vaping Use   Vaping Use: Never used  Substance and Sexual Activity   Alcohol use: Not Currently    Alcohol/week: 0.0 standard drinks of alcohol    Comment: on rare occasion   Drug use: No   Sexual activity: Not Currently  Other Topics Concern   Not on file  Social History Narrative   Not on file   Social Determinants of Health   Financial Resource Strain: Low Risk  (07/23/2022)   Overall Financial Resource Strain (CARDIA)    Difficulty of Paying Living Expenses: Not hard at all  Food Insecurity: No Food Insecurity (07/23/2022)   Hunger Vital Sign    Worried About Running Out of Food in the Last Year: Never true    Ran Out of Food in the Last Year: Never true  Transportation Needs: No Transportation Needs (07/23/2022)   PRAPARE - Hydrologist (Medical): No    Lack of  Transportation (Non-Medical): No  Physical Activity: Sufficiently Active (07/23/2022)   Exercise Vital Sign    Days of Exercise per Week: 3 days    Minutes of Exercise per Session: 60 min  Stress: No Stress Concern Present (07/23/2022)   [place]    Feeling of Stress : Not at all  Social Connections: Moderately Integrated (07/23/2022)   Social Connection and Isolation Panel [NHANES]    Frequency of Communication with Friends and Family: Twice a week    Frequency of Social Gatherings with Friends and Family: Once a week    Attends Religious Services: Never    Marine scientist or Organizations: Yes    Attends Music therapist: More than 4 times per year    Marital Status: Married    Tobacco Counseling Counseling given: Not Answered   Clinical Intake:  Pre-visit preparation completed: Yes  Pain : No/denies pain     Diabetes: Yes CBG done?: No Did pt. bring in CBG monitor from home?: No  How often do you need to have someone help you when you read instructions, pamphlets, or other written materials from your doctor or pharmacy?: 1 - Never  Diabetic?  Yes  Nutrition Risk Assessment:  Has the patient had any N/V/D within the last 2 months?  No  Does the patient have any non-healing wounds?  No  Has the patient had any unintentional weight loss or weight gain?  No   Diabetes:  Is the patient diabetic?  Yes  If diabetic, was a CBG obtained today?  No  Did the patient bring in their glucometer from home?  No  How often do you monitor your CBG's? Periodically through out day.   Financial Strains and Diabetes Management:  Are you having any financial strains with the device, your supplies or your medication? No .  Does the patient want to be seen by Chronic Care Management for management of their diabetes?  No  Would the patient like to be referred to  a Nutritionist or for Diabetic  Management?  No   Diabetic Exams:  Diabetic Eye Exam. Pt has been advised about the importance in completing this exam  Diabetic Foot Exam:  Pt has been advised about the importance in completing this exam.   Interpreter Needed?: No  Information entered by :: Leroy Kennedy LPN   Activities of Daily Living    07/23/2022   12:18 PM 06/04/2022    1:30 PM  In your present state of health, do you have any difficulty performing the following activities:  Hearing? 0 0  Vision? 0 0  Difficulty concentrating or making decisions? 0 0  Walking or climbing stairs? 0 1  Dressing or bathing? 0 0  Doing errands, shopping? 0 0  Preparing Food and eating ? N   Using the Toilet? N   In the past six months, have you accidently leaked urine? N   Do you have problems with loss of bowel control? N   Managing your Medications? N   Managing your Finances? N   Housekeeping or managing your Housekeeping? N     Patient Care Team: Valerie Roys, DO as PCP - General (Family Medicine) Rockey Situ Kathlene November, MD as PCP - Cardiology (Cardiology) Schnier, Dolores Lory, MD (Vascular Surgery) Murlean Iba, MD (Nephrology) Oneta Rack, MD (Dermatology) Vanita Ingles, RN as Case Manager  Indicate any recent Medical Services you may have received from other than [facility] providers in the past year (date may be approximate).     Assessment:   This is a routine wellness examination for Gavrielle.  Hearing/Vision screen Hearing Screening - Comments:: No trouble hearing Vision Screening - Comments:: Up to date [facility]  Dietary issues and exercise activities discussed: Current Exercise Habits: Structured exercise class, Time (Minutes): 50, Frequency (Times/Week): 3, Weekly Exercise (Minutes/Week): 150, Intensity: Moderate   Goals Addressed             This Visit's Progress    Patient Stated       Continue to be able to get around       Depression Screen    07/23/2022   12:17 PM 07/23/2022    12:07 PM 06/28/2022    1:29 PM 06/07/2022    1:19 PM 06/04/2022    1:30 PM 03/01/2022    2:06 PM 01/11/2022    1:11 PM  PHQ 2/9 Scores  PHQ - 2 Score 0 0 0 0 0 0 0  PHQ- 9 Score '1 1  1  '$ 0 1    Fall Risk    07/23/2022   12:03 PM 06/28/2022    1:28 PM 06/04/2022    1:29 PM 03/01/2022    2:06 PM 01/07/2022    3:22 PM  Fall Risk   Falls in the past year? '1 1 1 1 1  '$ Number falls in past yr: '1 1 1 '$ 0 1  Injury with Fall? 1 1 0 1 1  Risk for fall due to :  History of fall(s);Impaired mobility History of fall(s);Impaired balance/gait;Impaired mobility Impaired balance/gait History of fall(s)  Follow up Falls evaluation completed;Education provided;Falls prevention discussed Education provided Falls evaluation completed;Education provided;Falls prevention discussed Falls evaluation completed Falls evaluation completed  Comment  Had PT in the past       [place]:  Any stairs in or around the home? No  If so, are there any without handrails? No  Home free of loose throw rugs in walkways, pet beds, electrical  cords, etc? Yes  Adequate lighting in your home to reduce risk of falls? Yes   ASSISTIVE DEVICES UTILIZED TO PREVENT FALLS:  Life alert? No  Use of a cane, walker or w/c? Yes  Grab bars in the bathroom? No  Shower chair or bench in shower? Yes  Elevated toilet seat or a handicapped toilet? Yes   TIMED UP AND GO:  Was the test performed? No .    Cognitive Function:        07/23/2022   12:09 PM 07/17/2021    7:15 PM 07/13/2021    4:26 PM 05/22/2020    1:56 PM 04/27/2019    1:41 PM  6CIT Screen  What Year? 0 points 0 points 0 points 0 points 0 points  What month? 0 points 0 points 0 points 0 points 0 points  What time? 0 points 0 points 0 points 0 points 0 points  Count back from 20 0 points 0 points 0 points 0 points 0 points  Months in reverse 0 points 0 points 0 points 0 points 0 points  Repeat phrase 0 points 0 points 2 points 0 points 0  points  Total Score 0 points 0 points 2 points 0 points 0 points    Immunizations Immunization History  Administered Date(s) Administered   Fluad Quad(high Dose 65+) 07/13/2021   Influenza, High Dose Seasonal PF 07/22/2019   Influenza-Unspecified 07/21/2020   PFIZER(Purple Top)SARS-COV-2 Vaccination 12/03/2019, 12/24/2019, 08/04/2020   Pneumococcal Conjugate-13 01/22/2018   Pneumococcal Polysaccharide-23 11/05/2011   Td 10/21/2008, 06/07/2022    TDAP status: Up to date  Flu Vaccine status: Due, Education has been provided regarding the importance of this vaccine. Advised may receive this vaccine at local pharmacy or Health Dept. Aware to provide a copy of the vaccination record if obtained from local pharmacy or Health Dept. Verbalized acceptance and understanding.  Pneumococcal vaccine status: Up to date  Covid-19 vaccine status: Information provided on how to obtain vaccines.   Qualifies for Shingles Vaccine? Yes   Zostavax completed No   Shingrix Completed?: No.    Education has been provided regarding the importance of this vaccine. Patient has been advised to call insurance company to determine out of pocket expense if they have not yet received this vaccine. Advised may also receive vaccine at local pharmacy or Health Dept. Verbalized acceptance and understanding.  Screening Tests Health Maintenance  Topic Date Due   INFLUENZA VACCINE  05/21/2022   MAMMOGRAM  07/05/2022   COVID-19 Vaccine (4 - Pfizer risk series) 08/08/2022 (Originally 09/29/2020)   Zoster Vaccines- Shingrix (1 of 2) 10/23/2022 (Originally 03/27/1965)   OPHTHALMOLOGY EXAM  09/20/2022   HEMOGLOBIN A1C  12/08/2022   Diabetic kidney evaluation - Urine ACR  05/28/2023   Diabetic kidney evaluation - GFR measurement  06/08/2023   FOOT EXAM  06/08/2023   TETANUS/TDAP  06/07/2032   Pneumonia Vaccine 69+ Years old  Completed   DEXA SCAN  Completed   Hepatitis C Screening  Completed   HPV VACCINES  Aged Out    COLONOSCOPY (Pts 45-50yr Insurance coverage will need to be confirmed)  Discontinued    Health Maintenance  Health Maintenance Due  Topic Date Due   INFLUENZA VACCINE  05/21/2022   MAMMOGRAM  07/05/2022    Colorectal cancer screening: No longer required.   Mammogram  patient did not want to schedule yet will let uKoreaknow when to put order in  Bone Density will call when ready  Lung Cancer Screening: (Low Dose  CT Chest recommended if Age 13-80 years, 30 pack-year currently smoking OR have quit w/in 15years.) does not qualify.   Lung Cancer Screening Referral:   Additional Screening:  Hepatitis C Screening: does not qualify; Completed 2022  Vision Screening: Recommended annual ophthalmology exams for early detection of glaucoma and other disorders of the eye. Is the patient up to date with their annual eye exam?  Yes  Who is the provider or what is the name of the office in which the patient attends annual eye exams? [place] If pt is not established with a provider, would they like to be referred to a provider to establish care? No .   Dental Screening: Recommended annual dental exams for proper oral hygiene  Community Resource Referral / Chronic Care Management: CRR required this visit?  No   CCM required this visit?  No      Plan:     I have personally reviewed and noted the following in the patient's chart:   Medical and social history Use of alcohol, tobacco or illicit drugs  Current medications and supplements including opioid prescriptions. Patient is not currently taking opioid prescriptions. Functional ability and status Nutritional status Physical activity Advanced directives List of other physicians Hospitalizations, surgeries, and ER visits in previous 12 months Vitals Screenings to include cognitive, depression, and falls Referrals and appointments  In addition, I have reviewed and discussed with patient certain preventive protocols, quality  metrics, and best practice recommendations. A written personalized care plan for preventive services as well as general preventive health recommendations were provided to patient.     Leroy Kennedy, LPN   [id number]   Nurse Notes:

## 2022-07-23 NOTE — Patient Instructions (Signed)
Karen Farley , Thank you for taking time to come for your Medicare Wellness Visit. I appreciate your ongoing commitment to your health goals. Please review the following plan we discussed and let me know if I can assist you in the future.   Screening recommendations/referrals: Colonoscopy: no longer indicated Mammogram: Education provided Bone Density: Education provided Recommended yearly ophthalmology/optometry visit for glaucoma screening and checkup Recommended yearly dental visit for hygiene and checkup  Vaccinations: Influenza vaccine: Education provided Pneumococcal vaccine: up to date Tdap vaccine: up to date Shingles vaccine: Education provided          Preventive Care 18 Years and Older, Female Preventive care refers to lifestyle choices and visits with your health care provider that can promote health and wellness. What does preventive care include? A yearly physical exam. This is also called an annual well check. Dental exams once or twice a year. Routine eye exams. Ask your health care provider how often you should have your eyes checked. Personal lifestyle choices, including: Daily care of your teeth and gums. Regular physical activity. Eating a healthy diet. Avoiding tobacco and drug use. Limiting alcohol use. Practicing safe sex. Taking low-dose aspirin every day. Taking vitamin and mineral supplements as recommended by your health care provider. What happens during an annual well check? The services and screenings done by your health care provider during your annual well check will depend on your age, overall health, lifestyle risk factors, and family history of disease. Counseling  Your health care provider may ask you questions about your: Alcohol use. Tobacco use. Drug use. Emotional well-being. Home and relationship well-being. Sexual activity. Eating habits. History of falls. Memory and ability to understand (cognition). Work and work  Statistician. Reproductive health. Screening  You may have the following tests or measurements: Height, weight, and BMI. Blood pressure. Lipid and cholesterol levels. These may be checked every 5 years, or more frequently if you are over 82 years old. Skin check. Lung cancer screening. You may have this screening every year starting at age 21 if you have a 30-pack-year history of smoking and currently smoke or have quit within the past 15 years. Fecal occult blood test (FOBT) of the stool. You may have this test every year starting at age 70. Flexible sigmoidoscopy or colonoscopy. You may have a sigmoidoscopy every 5 years or a colonoscopy every 10 years starting at age 66. Hepatitis C blood test. Hepatitis B blood test. Sexually transmitted disease (STD) testing. Diabetes screening. This is done by checking your blood sugar (glucose) after you have not eaten for a while (fasting). You may have this done every 1-3 years. Bone density scan. This is done to screen for osteoporosis. You may have this done starting at age 53. Mammogram. This may be done every 1-2 years. Talk to your health care provider about how often you should have regular mammograms. Talk with your health care provider about your test results, treatment options, and if necessary, the need for more tests. Vaccines  Your health care provider may recommend certain vaccines, such as: Influenza vaccine. This is recommended every year. Tetanus, diphtheria, and acellular pertussis (Tdap, Td) vaccine. You may need a Td booster every 10 years. Zoster vaccine. You may need this after age 65. Pneumococcal 13-valent conjugate (PCV13) vaccine. One dose is recommended after age 63. Pneumococcal polysaccharide (PPSV23) vaccine. One dose is recommended after age 27. Talk to your health care provider about which screenings and vaccines you need and how often you need them. This  information is not intended to replace advice given to you by  your health care provider. Make sure you discuss any questions you have with your health care provider. Document Released: 11/03/2015 Document Revised: 06/26/2016 Document Reviewed: 08/08/2015 Elsevier Interactive Patient Education  2017 [place] Prevention in the Home Falls can cause injuries. They can happen to people of all ages. There are many things you can do to make your home safe and to help prevent falls. What can I do on the outside of my home? Regularly fix the edges of walkways and driveways and fix any cracks. Remove anything that might make you trip as you walk through a door, such as a raised step or threshold. Trim any bushes or trees on the path to your home. Use bright outdoor lighting. Clear any walking paths of anything that might make someone trip, such as rocks or tools. Regularly check to see if handrails are loose or broken. Make sure that both sides of any steps have handrails. Any raised decks and porches should have guardrails on the edges. Have any leaves, snow, or ice cleared regularly. Use sand or salt on walking paths during winter. Clean up any spills in your garage right away. This includes oil or grease spills. What can I do in the bathroom? Use night lights. Install grab bars by the toilet and in the tub and shower. Do not use towel bars as grab bars. Use non-skid mats or decals in the tub or shower. If you need to sit down in the shower, use a plastic, non-slip stool. Keep the floor dry. Clean up any water that spills on the floor as soon as it happens. Remove soap buildup in the tub or shower regularly. Attach bath mats securely with double-sided non-slip rug tape. Do not have throw rugs and other things on the floor that can make you trip. What can I do in the bedroom? Use night lights. Make sure that you have a light by your bed that is easy to reach. Do not use any sheets or blankets that are too big for your bed. They should not hang  down onto the floor. Have a firm chair that has side arms. You can use this for support while you get dressed. Do not have throw rugs and other things on the floor that can make you trip. What can I do in the kitchen? Clean up any spills right away. Avoid walking on wet floors. Keep items that you use a lot in easy-to-reach places. If you need to reach something above you, use a strong step stool that has a grab bar. Keep electrical cords out of the way. Do not use floor polish or wax that makes floors slippery. If you must use wax, use non-skid floor wax. Do not have throw rugs and other things on the floor that can make you trip. What can I do with my stairs? Do not leave any items on the stairs. Make sure that there are handrails on both sides of the stairs and use them. Fix handrails that are broken or loose. Make sure that handrails are as long as the stairways. Check any carpeting to make sure that it is firmly attached to the stairs. Fix any carpet that is loose or worn. Avoid having throw rugs at the top or bottom of the stairs. If you do have throw rugs, attach them to the floor with carpet tape. Make sure that you have a light switch at the top  of the stairs and the bottom of the stairs. If you do not have them, ask someone to add them for you. What else can I do to help prevent falls? Wear shoes that: Do not have high heels. Have rubber bottoms. Are comfortable and fit you well. Are closed at the toe. Do not wear sandals. If you use a stepladder: Make sure that it is fully opened. Do not climb a closed stepladder. Make sure that both sides of the stepladder are locked into place. Ask someone to hold it for you, if possible. Clearly mark and make sure that you can see: Any grab bars or handrails. First and last steps. Where the edge of each step is. Use tools that help you move around (mobility aids) if they are needed. These  include: Canes. Walkers. Scooters. Crutches. Turn on the lights when you go into a dark area. Replace any light bulbs as soon as they burn out. Set up your furniture so you have a clear path. Avoid moving your furniture around. If any of your floors are uneven, fix them. If there are any pets around you, be aware of where they are. Review your medicines with your doctor. Some medicines can make you feel dizzy. This can increase your chance of falling. Ask your doctor what other things that you can do to help prevent falls. This information is not intended to replace advice given to you by your health care provider. Make sure you discuss any questions you have with your health care provider. Document Released: 08/03/2009 Document Revised: 03/14/2016 Document Reviewed: 11/11/2014 Elsevier Interactive Patient Education  2017 [employer].

## 2022-07-26 ENCOUNTER — Ambulatory Visit: Payer: Medicare PPO | Admitting: *Deleted

## 2022-08-08 ENCOUNTER — Other Ambulatory Visit: Payer: Self-pay | Admitting: Family Medicine

## 2022-08-08 NOTE — Telephone Encounter (Signed)
Requested medications are due for refill today.  yes  Requested medications are on the active medications list.  yes  Last refill. Both refilled 02/14/2022 #90 1 rf  Future visit scheduled.   yes  Notes to clinic.  Abnormal labs, Refill not assigned a protocol.   Requested Prescriptions  Pending Prescriptions Disp Refills   JARDIANCE 25 MG TABS tablet [Pharmacy Med Name: JARDIANCE 25 MG TAB] 90 tablet 1    Sig: TAKE 1 TABLET BY MOUTH ONCE DAILY BEFOREBREAKFAST     Endocrinology:  Diabetes - SGLT2 Inhibitors Failed - 08/08/2022 11:03 AM      Failed - Cr in normal range and within 360 days    Creatinine, Ser  Date Value Ref Range Status  06/07/2022 1.74 (H) 0.57 - 1.00 mg/dL Final   Creatinine, Urine  Date Value Ref Range Status  04/11/2021 139 mg/dL Final         Failed - HBA1C is between 0 and 7.9 and within 180 days    Hemoglobin A1C  Date Value Ref Range Status  06/28/2016 6.6  Final   HB A1C (BAYER DCA - WAIVED)  Date Value Ref Range Status  06/07/2022 11.3 (H) 4.8 - 5.6 % Final    Comment:             Prediabetes: 5.7 - 6.4          Diabetes: >6.4          Glycemic control for adults with diabetes: <7.0          Failed - eGFR in normal range and within 360 days    GFR calc Af Amer  Date Value Ref Range Status  06/06/2020 44 (L) >59 mL/min/1.73 Final    Comment:    **[facility] currently reports eGFR in compliance with the current**   recommendations of the [employer]. [facility] will   update reporting as new guidelines are published from the NKF-ASN   Task force.    GFR, Estimated  Date Value Ref Range Status  12/18/2021 21 (L) >60 mL/min Final    Comment:    (NOTE) Calculated using the CKD-EPI Creatinine Equation (2021)    eGFR  Date Value Ref Range Status  06/07/2022 30 (L) >59 mL/min/1.73 Final         Passed - Valid encounter within last 6 months    Recent Outpatient Visits           2 months ago Type 2 diabetes mellitus with  diabetic nephropathy, with long-term current use of insulin (HCC)   [facility] [place], Karen P, DO   5 months ago Type 2 diabetes mellitus with diabetic nephropathy, with long-term current use of insulin (HCC)   [facility] [place], Karen P, DO   5 months ago Type 2 diabetes mellitus with diabetic nephropathy, with long-term current use of insulin (HCC)   [facility] [place], Karen P, DO   6 months ago Type 2 diabetes mellitus with diabetic nephropathy, with long-term current use of insulin (HCC)   [facility] [place], Karen P, DO   6 months ago Type 2 diabetes mellitus with diabetic nephropathy, with long-term current use of insulin (HCC)   [facility] [place], Karen P, DO       Future Appointments             In 2 weeks Farley, Karen Pizza, MD [facility]  iron polysaccharides (FERREX 150) 150 MG capsule 90 capsule 1    Sig: TAKE 1 CAPSULE BY MOUTH ONCE DAILY     Off-Protocol Failed - 08/08/2022 11:03 AM      Failed - Medication not assigned to a protocol, review manually.      Passed - Valid encounter within last 12 months    Recent Outpatient Visits           2 months ago Type 2 diabetes mellitus with diabetic nephropathy, with long-term current use of insulin ([place])   [place], Karen P, DO   5 months ago Type 2 diabetes mellitus with diabetic nephropathy, with long-term current use of insulin ([place])   [place], Karen P, DO   5 months ago Type 2 diabetes mellitus with diabetic nephropathy, with long-term current use of insulin ([place])   [place], Karen P, DO   6 months ago Type 2 diabetes mellitus with diabetic nephropathy, with long-term current use of insulin ([place])   [place], Karen P, DO   6 months ago Type 2 diabetes mellitus with diabetic  nephropathy, with long-term current use of insulin ([place])   Farley, [name], DO       Future Appointments             In 2 weeks Farley, Kathlene November, MD [place]. [place]

## 2022-08-09 NOTE — Telephone Encounter (Signed)
Needs appt

## 2022-08-12 NOTE — Telephone Encounter (Signed)
LVM asking patient to call back to schedule an appointment 

## 2022-08-15 NOTE — Telephone Encounter (Signed)
2nd attempt to reach patient to schedule an appointment

## 2022-08-20 ENCOUNTER — Other Ambulatory Visit: Payer: Self-pay | Admitting: Family Medicine

## 2022-08-21 DIAGNOSIS — N1832 Chronic kidney disease, stage 3b: Secondary | ICD-10-CM | POA: Diagnosis not present

## 2022-08-21 DIAGNOSIS — N179 Acute kidney failure, unspecified: Secondary | ICD-10-CM | POA: Diagnosis not present

## 2022-08-21 DIAGNOSIS — I890 Lymphedema, not elsewhere classified: Secondary | ICD-10-CM | POA: Diagnosis not present

## 2022-08-21 DIAGNOSIS — N184 Chronic kidney disease, stage 4 (severe): Secondary | ICD-10-CM | POA: Diagnosis not present

## 2022-08-21 DIAGNOSIS — E1122 Type 2 diabetes mellitus with diabetic chronic kidney disease: Secondary | ICD-10-CM | POA: Diagnosis not present

## 2022-08-21 DIAGNOSIS — R600 Localized edema: Secondary | ICD-10-CM | POA: Diagnosis not present

## 2022-08-21 NOTE — Telephone Encounter (Signed)
Requested Prescriptions  Pending Prescriptions Disp Refills  . pantoprazole (PROTONIX) 40 MG tablet [Pharmacy Med Name: PANTOPRAZOLE SODIUM 40 MG DR TAB] 180 tablet 3    Sig: TAKE 1 TABLET BY MOUTH TWICE DAILY     Gastroenterology: Proton Pump Inhibitors Passed - 08/20/2022  7:15 PM      Passed - Valid encounter within last 12 months    Recent Outpatient Visits          2 months ago Type 2 diabetes mellitus with diabetic nephropathy, with long-term current use of insulin ([place])   [place], [name], DO   5 months ago Type 2 diabetes mellitus with diabetic nephropathy, with long-term current use of insulin ([place])   [place], [name], DO   6 months ago Type 2 diabetes mellitus with diabetic nephropathy, with long-term current use of insulin ([place])   [place], [name], DO   6 months ago Type 2 diabetes mellitus with diabetic nephropathy, with long-term current use of insulin ([place])   [place], [name], DO   6 months ago Type 2 diabetes mellitus with diabetic nephropathy, with long-term current use of insulin ([place])   [place], [place], DO      Future Appointments            In 5 days Goll[place], Kathlene November, MD [place]. [place]

## 2022-08-25 NOTE — Progress Notes (Unsigned)
Cardiology Office Note  Date:  08/26/2022   ID:  Karen Farley, DOB 1945-12-16, MRN [id number]  PCP:  Valerie Roys, DO   Chief Complaint  Patient presents with   6 month follow up     "Doing well." Medications reviewed by the patient verbally.     HPI:  76 year old female with a history of  HFpEF,  hypertension,  stage III chronic kidney disease,  lymphedema,  permanent atrial fibrillation,  hyperlipidemia,  diabetes,  pericardial effusion status post pericardiocentesis,  pleural effusion status post thoracentesis, and  GI bleed,  who presents for follow-up of heart failure and hypokalemia.  Last seen in clinic by myself May 2023 Followed by CHF clinic last seen on August 2023 Followed by nephrology, Dr. Candiss Norse Followed by Dr. Park Liter Diabetic nurse at [facility]  Using lymphedema pumps 2 hrs a day Minimal leg edema Weight stable, down 6 pounds since 6/23 Down 26 pounds from 2/23  Labs reviewed A1C 11.3, On insulin 48 units every night has not started short acting insulin before meals Total chol 246 up from 194 LDL 176, up from 133  Taking torsemide 20 daily and metolazone 2.5 three times a week Sometimes 40 daily PRN for leg swelling  Walking with a walker  EKG personally reviewed by myself on todays visit Atrial fibrillation rate 76 bpm no ST or T wave changes  Prior cardiac history reviewed Admitted to the hospital October 2022, large left pleural effusion requiring thoracentesis Rectal bleed, Eliquis discontinued, required transfusion Large pericardial effusion noted, no tamponade on echo Pericardiocentesis performed 700 Follow-up echo stable moderate effusion Required diuresis, transition to torsemide metolazone, Eliquis resumed  PMH:   has a past medical history of A-fib ([place]), CHF (congestive heart failure) ([place]), Chronic heart failure with preserved ejection fraction (HFpEF) ([place]), 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  PSH:    Past Surgical History:  Procedure Laterality Date   CATARACT EXTRACTION W/PHACO Left 10/09/2021   Procedure: CATARACT EXTRACTION PHACO AND INTRAOCULAR LENS PLACEMENT (IOC) LEFT DIABETIC 4.84 00:37.8;  Surgeon: Birder Robson, MD;  Location: [facility];  Service: Ophthalmology;  Laterality: Left;  Diabetic   CATARACT EXTRACTION W/PHACO Right 10/30/2021   Procedure: CATARACT EXTRACTION PHACO AND INTRAOCULAR LENS PLACEMENT (IOC) RIGHT DIABETIC 5.65 00:38.5;  Surgeon: Birder Robson, MD;  Location: [facility];  Service: Ophthalmology;  Laterality: Right;  Diabetic   CHOLECYSTECTOMY     DILATION AND CURETTAGE OF UTERUS     IR FLUORO GUIDE CV LINE RIGHT  08/16/2021   IR THORACENTESIS ASP PLEURAL SPACE W/IMG GUIDE  08/16/2021   PERICARDIOCENTESIS N/A 08/05/2021   Procedure: PERICARDIOCENTESIS;  Surgeon: Isaias Cowman, MD;  Location: [facility];  Service: Cardiovascular;  Laterality: N/A;   TEAR DUCT PROBING WITH STRABISMUS REPAIR Right    TONSILLECTOMY      Current Outpatient Medications  Medication Sig Dispense Refill   ACCU-CHEK GUIDE test strip USE TO CHECK BLOOD SUGAR DAILY     Cholecalciferol 125 MCG (5000 UT) TABS Take by mouth.     ELIQUIS 5 MG TABS tablet TAKE 1 TABLET BY MOUTH TWICE DAILY 952 tablet 3   folic acid (FOLVITE) 1 MG tablet TAKE 1 TABLET BY MOUTH ONCE DAILY 90  tablet 2   iron polysaccharides (FERREX 150) 150 MG capsule TAKE 1 CAPSULE BY MOUTH ONCE DAILY 30 capsule 0   JARDIANCE 25 MG TABS tablet TAKE 1 TABLET BY MOUTH ONCE DAILY BEFOREBREAKFAST 30 tablet 0   latanoprost (XALATAN) 0.005 %  ophthalmic solution Place 1 drop into both eyes at bedtime.     metolazone (ZAROXOLYN) 2.5 MG tablet Take 1 tablet (2.5 mg total) by mouth every Monday, Wednesday, and Friday. 36 tablet 1   metoprolol succinate (TOPROL-XL) 25 MG 24 hr tablet Take 0.5 tablets (12.5 mg total) by mouth daily. 90 tablet 1   midodrine (PROAMATINE) 10 MG tablet Take 1 tablet (10 mg total) by mouth 3 (three) times daily with meals. 90 tablet 0   Multiple Vitamin (MULTIVITAMIN WITH MINERALS) TABS tablet Take 1 tablet by mouth every other day.     pantoprazole (PROTONIX) 40 MG tablet TAKE 1 TABLET BY MOUTH TWICE DAILY 180 tablet 3   potassium chloride SA (KLOR-CON M) 20 MEQ tablet Take 2 tablets (40 mEq total) by mouth 2 (two) times daily. 360 tablet 0   rosuvastatin (CRESTOR) 5 MG tablet Take 1 tablet (5 mg total) by mouth once a week. 52 tablet 0   spironolactone (ALDACTONE) 25 MG tablet TAKE 1 TABLET(25 MG) BY MOUTH DAILY 90 tablet 0   torsemide (DEMADEX) 20 MG tablet TAKE 1 TO 2 TABLETS BY MOUTH DAILY. ONLY TAKE 2 TABLETS FOR 1 TO 3 DAYS AND IF PERSISTS, CALL WITH BAD SWELLING 180 tablet 1   TOUJEO MAX SOLOSTAR 300 UNIT/ML Solostar Pen INJECT 48 UNITS SUBCUTANEOUSLY AT BEDTIME 9 mL 3   vitamin B-12 (CYANOCOBALAMIN) 500 MCG tablet Take 1 tablet (500 mcg total) by mouth daily. 90 tablet 3   insulin aspart (NOVOLOG) 100 UNIT/ML injection Inject 4 Units into the skin 3 (three) times daily with meals. Increase 1 unit every 50 points above 150. (Patient not taking: Reported on 06/28/2022)     Insulin Pen Needle (PEN NEEDLES) 30G X 8 MM MISC 1 each by Does not apply route daily. (Patient not taking: Reported on 08/26/2022) 100 each 12   Insulin Syringe-Needle U-100 (INSULIN SYRINGE 1CC/31GX5/16") 31G X 5/16" 1 ML MISC 1 each by Does not apply route 3 (three) times daily. (Patient not taking: Reported on 08/26/2022) 100 each 12   No current facility-administered medications for this visit.    Allergies:   Patient has no known  allergies.   Social History:  The patient  reports that she has never smoked. She has never used smokeless tobacco. She reports that she does not currently use alcohol. She reports that she does not use drugs.   Family History:   family history includes Breast cancer in her maternal grandmother and mother; Cancer in her brother and brother; Diabetes in her brother; Heart disease in her brother and mother; Heart disease (age of onset: 72) in her father; Osteoporosis in her mother; Parkinson's disease in her brother.    Review of Systems: Review of Systems  Constitutional: Negative.   HENT: Negative.    Respiratory: Negative.    Cardiovascular: Negative.   Gastrointestinal: Negative.   Musculoskeletal: Negative.   Neurological: Negative.   Psychiatric/Behavioral: Negative.    All other systems reviewed and are negative.    PHYSICAL EXAM: VS:  BP (!) 120/90 (BP Location: Left Arm, Patient Position: Sitting, Cuff Size: Normal)   Pulse 76   Ht 5' 1.5" (1.562 m)   Wt 162 lb (73.5 kg)   SpO2  97%   BMI 30.11 kg/m  , BMI Body mass index is 30.11 kg/m. Constitutional:  oriented to person, place, and time. No distress.  Obese HENT:  Head: Grossly normal Eyes:  no discharge. No scleral icterus.  Neck: No JVD, no carotid bruits  Cardiovascular: Regular rate and rhythm, no murmurs appreciated Pulmonary/Chest: Clear to auscultation bilaterally, no wheezes or rails Abdominal: Soft.  no distension.  no tenderness.  Musculoskeletal: Normal range of motion Neurological:  normal muscle tone. Coordination normal. No atrophy Skin: Skin warm and dry Psychiatric: normal affect, pleasant   Recent Labs: 09/11/2021: Magnesium 2.2 06/07/2022: ALT 17; BUN 38; Creatinine, Ser 1.74; Hemoglobin 16.2; Platelets 326; Potassium 3.8; Sodium 137    Lipid Panel Lab Results  Component Value Date   CHOL 246 (H) 06/07/2022   HDL 45 06/07/2022   LDLCALC 176 (H) 06/07/2022   TRIG 137 06/07/2022       Wt Readings from Last 3 Encounters:  08/26/22 162 lb (73.5 kg)  06/28/22 162 lb 8 oz (73.7 kg)  06/07/22 164 lb 14.4 oz (74.8 kg)     ASSESSMENT AND PLAN:  Problem List Items Addressed This Visit       Cardiology Problems   Hyperlipidemia   Pericardial effusion   Pulmonary hypertension, primary (HCC)   Atrial fibrillation (HCC)   PAD (peripheral artery disease) ([place])     Other   Type 2 diabetes mellitus ([place])   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 diabetes mellitus ([place])   Lymphedema   Other Visit Diagnoses     Chronic heart failure with preserved ejection fraction (HCC)    -  Primary   Essential hypertension         Chronic diastolic CHF Appears euvolemic, lungs clear, no PND orthopnea Weight stable if not trending downward continue torsemide 20 daily with metolazone 2.5 three  days a week Extra torsemide as needed for leg swelling Followed by nephrology, renal function stable  Chronic renal failure Managed by nephrology, creatinine running in the low 2 range, GFR 25 up to 28  Hypotension Continue midodrine 10 milligram 3 times daily for now Low blood pressure exacerbated by weight loss  Anemia Hemoglobin stable  Permanent atrial fibrillation On anticoagulation, rate controlled on low-dose metoprolol  Diabetes type 2 poorly controlled Long discussion concerning her diet, use of insulin She has not started short acting insulin prior to meals Followed by diabetic coordinator on campus   Total encounter time more than 40 minutes  Greater than 50% was spent in counseling and coordination of care with the patient    Signed, Esmond Plants, M.D., Ph.D. [place], [place]

## 2022-08-26 ENCOUNTER — Encounter: Payer: Self-pay | Admitting: Cardiovascular Disease

## 2022-08-26 ENCOUNTER — Ambulatory Visit: Payer: Medicare PPO | Attending: Cardiovascular Disease | Admitting: Cardiovascular Disease

## 2022-08-26 VITALS — BP 120/90 | HR 76 | Ht 61.5 in | Wt 162.0 lb

## 2022-08-26 DIAGNOSIS — I890 Lymphedema, not elsewhere classified: Secondary | ICD-10-CM | POA: Diagnosis not present

## 2022-08-26 DIAGNOSIS — I3139 Other pericardial effusion (noninflammatory): Secondary | ICD-10-CM | POA: Diagnosis not present

## 2022-08-26 DIAGNOSIS — I10 Essential (primary) hypertension: Secondary | ICD-10-CM | POA: Diagnosis not present

## 2022-08-26 DIAGNOSIS — I4821 Permanent atrial fibrillation: Secondary | ICD-10-CM

## 2022-08-26 DIAGNOSIS — I270 Primary pulmonary hypertension: Secondary | ICD-10-CM

## 2022-08-26 DIAGNOSIS — Z794 Long term (current) use of insulin: Secondary | ICD-10-CM

## 2022-08-26 DIAGNOSIS — I5032 Chronic diastolic (congestive) heart failure: Secondary | ICD-10-CM | POA: Diagnosis not present

## 2022-08-26 DIAGNOSIS — N183 Chronic kidney disease, stage 3 unspecified: Secondary | ICD-10-CM

## 2022-08-26 DIAGNOSIS — I739 Peripheral vascular disease, unspecified: Secondary | ICD-10-CM

## 2022-08-26 DIAGNOSIS — E1122 Type 2 diabetes mellitus with diabetic chronic kidney disease: Secondary | ICD-10-CM

## 2022-08-26 DIAGNOSIS — E782 Mixed hyperlipidemia: Secondary | ICD-10-CM

## 2022-08-26 DIAGNOSIS — N184 Chronic kidney disease, stage 4 (severe): Secondary | ICD-10-CM

## 2022-08-26 NOTE — Patient Instructions (Signed)
Medication Instructions:  No changes  If you need a refill on your cardiac medications before your next appointment, please call your pharmacy.    Lab work: No new labs needed   Testing/Procedures: No new testing needed   Follow-Up: At [facility], you and your health needs are our priority.  As part of our continuing mission to provide you with exceptional heart care, we have created designated Provider Care Teams.  These Care Teams include your primary Cardiologist (physician) and Advanced Practice Providers (APPs -  Physician Assistants and Nurse Practitioners) who all work together to provide you with the care you need, when you need it.  You will need a follow up appointment in 6 months  Providers on your designated Care Team:   Christopher Berge, NP Ryan Dunn, PA-C Cadence Furth, PA-C  COVID-19 Vaccine Information can be found at: [email or web address] For questions related to vaccine distribution or appointments, please email [email or web address] or call [phone].   

## 2022-08-28 DIAGNOSIS — I890 Lymphedema, not elsewhere classified: Secondary | ICD-10-CM | POA: Diagnosis not present

## 2022-08-28 DIAGNOSIS — R600 Localized edema: Secondary | ICD-10-CM | POA: Diagnosis not present

## 2022-08-28 DIAGNOSIS — E876 Hypokalemia: Secondary | ICD-10-CM | POA: Diagnosis not present

## 2022-08-28 DIAGNOSIS — E1122 Type 2 diabetes mellitus with diabetic chronic kidney disease: Secondary | ICD-10-CM | POA: Diagnosis not present

## 2022-08-28 DIAGNOSIS — N184 Chronic kidney disease, stage 4 (severe): Secondary | ICD-10-CM | POA: Diagnosis not present

## 2022-09-03 ENCOUNTER — Encounter: Payer: Self-pay | Admitting: *Deleted

## 2022-09-04 ENCOUNTER — Encounter: Payer: Self-pay | Admitting: Family Medicine

## 2022-09-04 ENCOUNTER — Ambulatory Visit: Payer: Medicare PPO | Admitting: Family Medicine

## 2022-09-04 VITALS — BP 108/71 | HR 97 | Temp 98.4°F | Ht 61.5 in | Wt 162.0 lb

## 2022-09-04 DIAGNOSIS — Z794 Long term (current) use of insulin: Secondary | ICD-10-CM | POA: Diagnosis not present

## 2022-09-04 DIAGNOSIS — Z23 Encounter for immunization: Secondary | ICD-10-CM | POA: Diagnosis not present

## 2022-09-04 DIAGNOSIS — E1121 Type 2 diabetes mellitus with diabetic nephropathy: Secondary | ICD-10-CM

## 2022-09-04 LAB — BAYER DCA HB A1C WAIVED: HB A1C (BAYER DCA - WAIVED): 8.3 % — ABNORMAL HIGH (ref 4.8–5.6)

## 2022-09-04 MED ORDER — CYANOCOBALAMIN 500 MCG PO TABS: 500.0000 ug | ORAL_TABLET | Freq: Every day | ORAL | 3 refills | Status: DC

## 2022-09-04 MED ORDER — MIDODRINE HCL 10 MG PO TABS: 10.0000 mg | ORAL_TABLET | Freq: Three times a day (TID) | ORAL | 0 refills | Status: DC

## 2022-09-04 MED ORDER — POLYSACCHARIDE IRON COMPLEX 150 MG PO CAPS: ORAL_CAPSULE | ORAL | 1 refills | Status: DC

## 2022-09-04 MED ORDER — SPIRONOLACTONE 25 MG PO TABS: ORAL_TABLET | ORAL | 1 refills | Status: DC

## 2022-09-04 MED ORDER — METOPROLOL SUCCINATE ER 25 MG PO TB24: 12.5000 mg | ORAL_TABLET | Freq: Every day | ORAL | 1 refills | Status: DC

## 2022-09-04 MED ORDER — EMPAGLIFLOZIN 25 MG PO TABS: ORAL_TABLET | ORAL | 1 refills | Status: DC

## 2022-09-04 MED ORDER — TORSEMIDE 20 MG PO TABS: ORAL_TABLET | ORAL | 1 refills | Status: DC

## 2022-09-04 MED ORDER — POTASSIUM CHLORIDE CRYS ER 20 MEQ PO TBCR: 40.0000 meq | EXTENDED_RELEASE_TABLET | Freq: Two times a day (BID) | ORAL | 1 refills | Status: DC

## 2022-09-04 NOTE — Assessment & Plan Note (Signed)
Doing much better with A1c of 8.3 down from 11.3! Has only been on her novolog for up to 2 weeks. Will continue current regimen. Call with any concerns. Follow up 3 months.

## 2022-09-04 NOTE — Progress Notes (Signed)
BP 108/71   Pulse 97   Temp 98.4 F (36.9 C) (Oral)   Ht 5' 1.5" (1.562 m)   Wt 162 lb (73.5 kg)   SpO2 99%   BMI 30.11 kg/m    Subjective:    Patient ID: Karen Farley, female    DOB: [birth year], 76 y.o.   MRN: [id number]  HPI: Karen Farley is a 76 y.o. female  Chief Complaint  Patient presents with   Diabetes    Patient is here for a follow up on Diabetes.   DIABETES- has started her novolog in the past couple of weeks. Sugars are improving. Doing better with the dexcom Hypoglycemic episodes:no Polydipsia/polyuria: no Visual disturbance: no Chest pain: no Paresthesias: no Glucose Monitoring: yes  Accucheck frequency:  constant  Fasting glucose: see scanned document, 110-225 Taking Insulin?: yes  Long acting insulin: yes  Short acting insulin: yes Blood Pressure Monitoring: not checking Retinal Examination: Up to Date Foot Exam: Up to Date Diabetic Education: Completed Pneumovax: Up to Date Influenza: Up to Date Aspirin: no  Relevant past medical, surgical, family and social history reviewed and updated as indicated. Interim medical history since our last visit reviewed. Allergies and medications reviewed and updated.  Review of Systems  Constitutional: Negative.   Respiratory: Negative.    Cardiovascular: Negative.   Gastrointestinal: Negative.   Musculoskeletal: Negative.   Neurological: Negative.   Psychiatric/Behavioral: Negative.      Per HPI unless specifically indicated above     Objective:    BP 108/71   Pulse 97   Temp 98.4 F (36.9 C) (Oral)   Ht 5' 1.5" (1.562 m)   Wt 162 lb (73.5 kg)   SpO2 99%   BMI 30.11 kg/m   Wt Readings from Last 3 Encounters:  09/04/22 162 lb (73.5 kg)  08/26/22 162 lb (73.5 kg)  06/28/22 162 lb 8 oz (73.7 kg)    Physical Exam Vitals and nursing note reviewed.  Constitutional:      General: She is not in acute distress.    Appearance: Normal appearance. She is not ill-appearing,  toxic-appearing or diaphoretic.  HENT:     Head: Normocephalic and atraumatic.     Right Ear: External ear normal.     Left Ear: External ear normal.     Nose: Nose normal.     Mouth/Throat:     Mouth: Mucous membranes are moist.     Pharynx: Oropharynx is clear.  Eyes:     General: No scleral icterus.       Right eye: No discharge.        Left eye: No discharge.     Extraocular Movements: Extraocular movements intact.     Conjunctiva/sclera: Conjunctivae normal.     Pupils: Pupils are equal, round, and reactive to light.  Cardiovascular:     Rate and Rhythm: Normal rate and regular rhythm.     Pulses: Normal pulses.     Heart sounds: Normal heart sounds. No murmur heard.    No friction rub. No gallop.  Pulmonary:     Effort: Pulmonary effort is normal. No respiratory distress.     Breath sounds: Normal breath sounds. No stridor. No wheezing, rhonchi or rales.  Chest:     Chest wall: No tenderness.  Musculoskeletal:        General: Normal range of motion.     Cervical back: Normal range of motion and neck supple.  Skin:    General: Skin is warm and  dry.     Capillary Refill: Capillary refill takes less than 2 seconds.     Coloration: Skin is not jaundiced or pale.     Findings: No bruising, erythema, lesion or rash.  Neurological:     General: No focal deficit present.     Mental Status: She is alert and oriented to person, place, and time. Mental status is at baseline.  Psychiatric:        Mood and Affect: Mood normal.        Behavior: Behavior normal.        Thought Content: Thought content normal.        Judgment: Judgment normal.     Results for orders placed or performed in visit on 09/04/22  Bayer DCA Hb A1c Waived  Result Value Ref Range   HB A1C (BAYER DCA - WAIVED) 8.3 (H) 4.8 - 5.6 %      Assessment & Plan:   Problem List Items Addressed This Visit       Endocrine   Type 2 diabetes mellitus ([place]) - Primary    Doing much better with A1c of 8.3 down  from 11.3! Has only been on her novolog for up to 2 weeks. Will continue current regimen. Call with any concerns. Follow up 3 months.       Relevant Medications   empagliflozin (JARDIANCE) 25 MG TABS tablet   Other Relevant Orders   Bayer DCA Hb A1c Waived (Completed)   Other Visit Diagnoses     Flu vaccine need       Flu shot given today.   Relevant Orders   Flu Vaccine QUAD High Dose(Fluad) (Completed)        Follow up plan: Return in about 3 months (around 12/05/2022) for physical.

## 2022-09-18 DIAGNOSIS — L821 Other seborrheic keratosis: Secondary | ICD-10-CM | POA: Diagnosis not present

## 2022-09-18 DIAGNOSIS — C4401 Basal cell carcinoma of skin of lip: Secondary | ICD-10-CM | POA: Diagnosis not present

## 2022-09-18 DIAGNOSIS — L718 Other rosacea: Secondary | ICD-10-CM | POA: Diagnosis not present

## 2022-09-18 DIAGNOSIS — D485 Neoplasm of uncertain behavior of skin: Secondary | ICD-10-CM | POA: Diagnosis not present

## 2022-09-18 DIAGNOSIS — H61002 Unspecified perichondritis of left external ear: Secondary | ICD-10-CM | POA: Diagnosis not present

## 2022-09-20 ENCOUNTER — Other Ambulatory Visit: Payer: Self-pay | Admitting: Cardiovascular Disease

## 2022-10-03 DIAGNOSIS — E1129 Type 2 diabetes mellitus with other diabetic kidney complication: Secondary | ICD-10-CM | POA: Diagnosis not present

## 2022-10-11 ENCOUNTER — Other Ambulatory Visit: Payer: Self-pay | Admitting: Family Medicine

## 2022-10-11 NOTE — Telephone Encounter (Signed)
Requested Prescriptions  Pending Prescriptions Disp Refills   ULTRACARE PEN NEEDLES 32G X 4 MM MISC [Pharmacy Med Name: ULTRACARE PEN NEEDLES 32G X 4 MM] 100 each 0    Sig: USE 1 PEN ONCE DAILY     Endocrinology: Diabetes - Testing Supplies Passed - 10/11/2022  1:43 PM      Passed - Valid encounter within last 12 months    Recent Outpatient Visits           1 month ago Type 2 diabetes mellitus with diabetic nephropathy, with long-term current use of insulin ([place])   [place], [name], DO   4 months ago Type 2 diabetes mellitus with diabetic nephropathy, with long-term current use of insulin ([place])   [place], [name], DO   7 months ago Type 2 diabetes mellitus with diabetic nephropathy, with long-term current use of insulin ([employer])   [place], [name], DO   7 months ago Type 2 diabetes mellitus with diabetic nephropathy, with long-term current use of insulin ([place])   [place], [name], DO   8 months ago Type 2 diabetes mellitus with diabetic nephropathy, with long-term current use of insulin ([place])   [place], Nadine, DO       Future Appointments             In 1 month Johnson, Barb Merino, DO [employer]   In 4 months Gollan, Kathlene November, MD [place] [place]

## 2022-11-01 DIAGNOSIS — C4492 Squamous cell carcinoma of skin, unspecified: Secondary | ICD-10-CM | POA: Diagnosis not present

## 2022-11-01 DIAGNOSIS — C4491 Basal cell carcinoma of skin, unspecified: Secondary | ICD-10-CM | POA: Diagnosis not present

## 2022-11-01 DIAGNOSIS — D049 Carcinoma in situ of skin, unspecified: Secondary | ICD-10-CM | POA: Diagnosis not present

## 2022-11-01 DIAGNOSIS — C4490 Unspecified malignant neoplasm of skin, unspecified: Secondary | ICD-10-CM | POA: Diagnosis not present

## 2022-11-01 DIAGNOSIS — C4401 Basal cell carcinoma of skin of lip: Secondary | ICD-10-CM | POA: Diagnosis not present

## 2022-11-01 DIAGNOSIS — C439 Malignant melanoma of skin, unspecified: Secondary | ICD-10-CM | POA: Diagnosis not present

## 2022-11-01 HISTORY — PX: BASAL CELL CARCINOMA EXCISION: SHX1214

## 2022-11-25 IMAGING — DX DG FOOT 2V*L*
2 series · 2 of 2 positions shown · non-contrast
Comparison: None.

CLINICAL DATA: Fell.  Left foot pain.

EXAM:
LEFT FOOT - 2 VIEW

[foot ap]
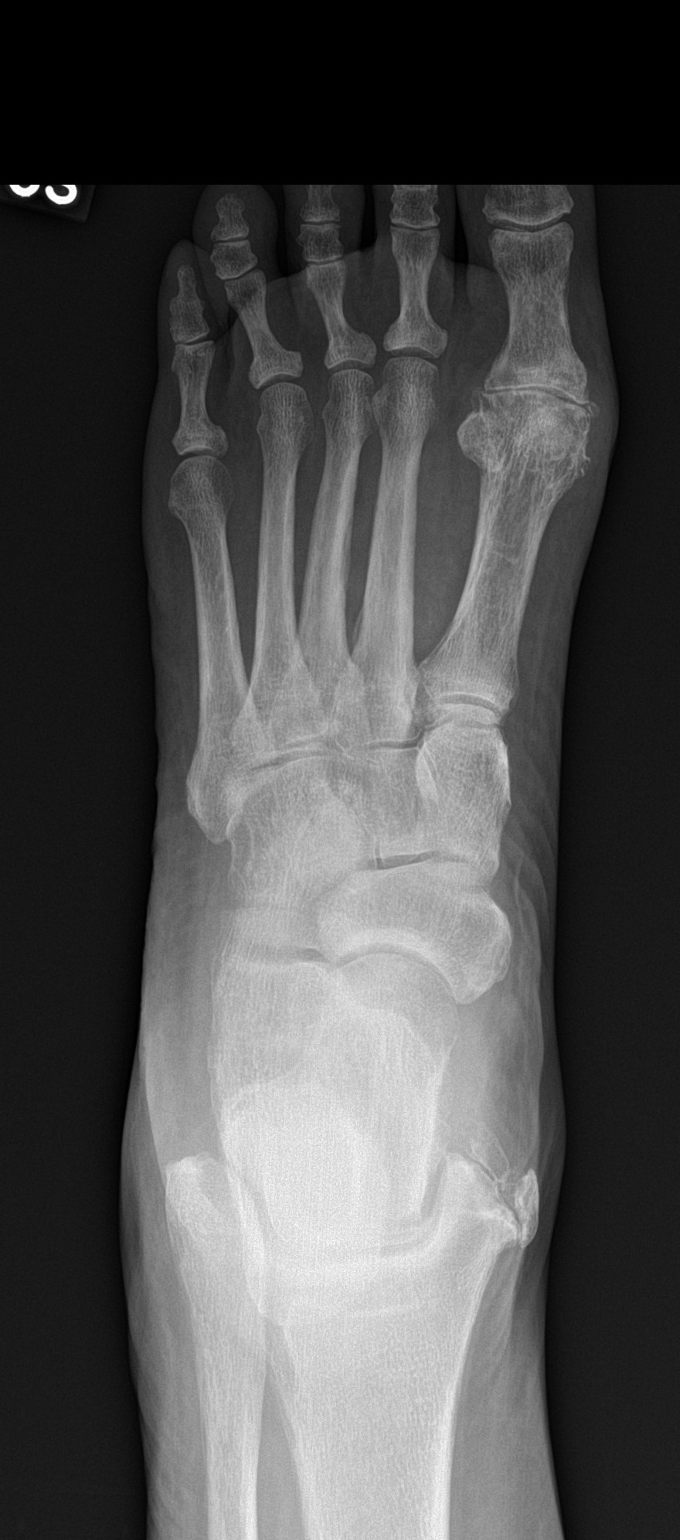

[foot lat]
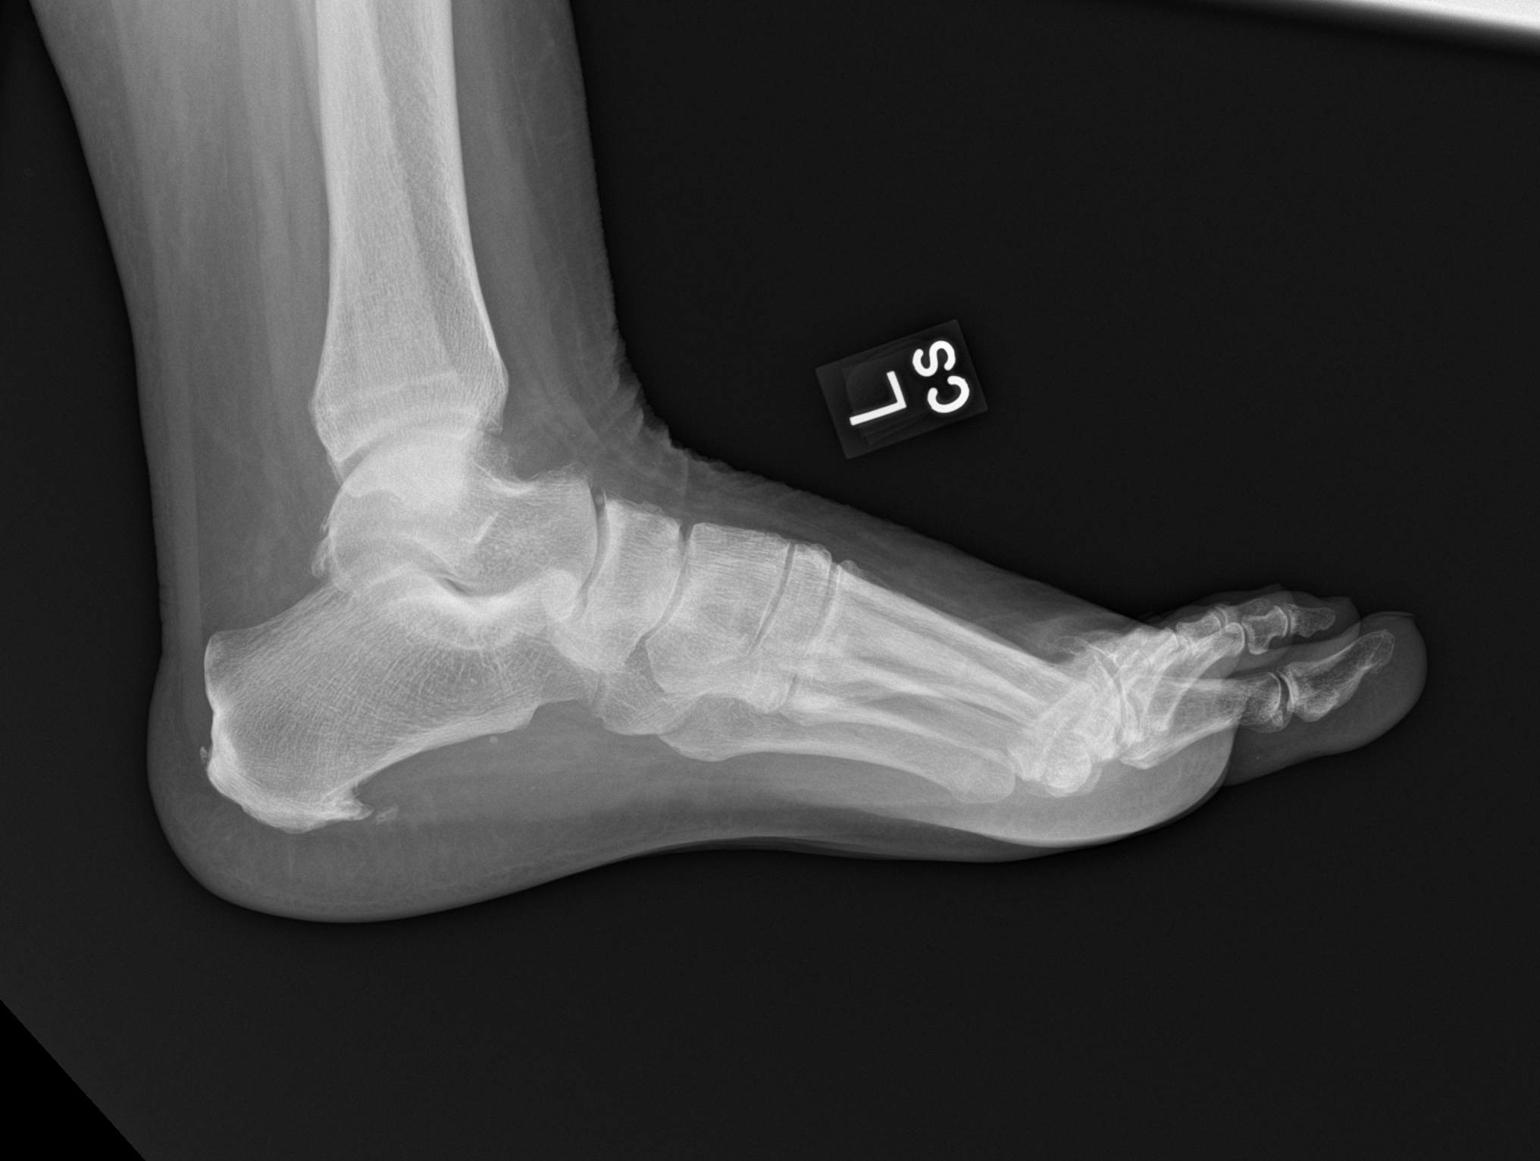

[2 of 2 positions shown; findings below may reference images not displayed]

FINDINGS: Advanced degenerative changes at the first MTP joint but no definite
erosive findings. Moderate degenerative changes are also noted at
the ankle joint. Moderate size calcaneal heel spur.

No acute fracture is identified.
IMPRESSION: 1. Advanced degenerative changes at the first MTP joint.
2. No acute bony findings.

## 2022-11-25 IMAGING — CT CT CERVICAL SPINE W/O CM
3 of 4 series · 12 of 35 positions shown, 14 images · non-contrast
Comparison: CT head dated April 11, 2021.

CLINICAL DATA: Fall.  Hit head on dresser.  On Eliquis.



[Series 6: orthogonal bone · axial · 0.24mm/px · z∈[+239,+383]mm · 4 of 109 slices shown, 5 images]
[im 16/109  soft-tissue]
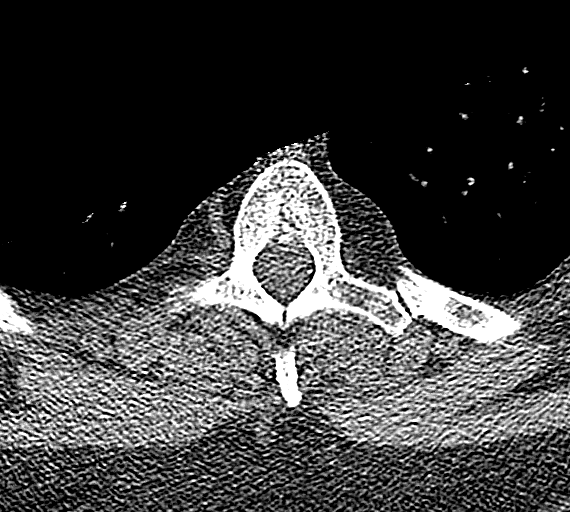
[im 16/109  bone]
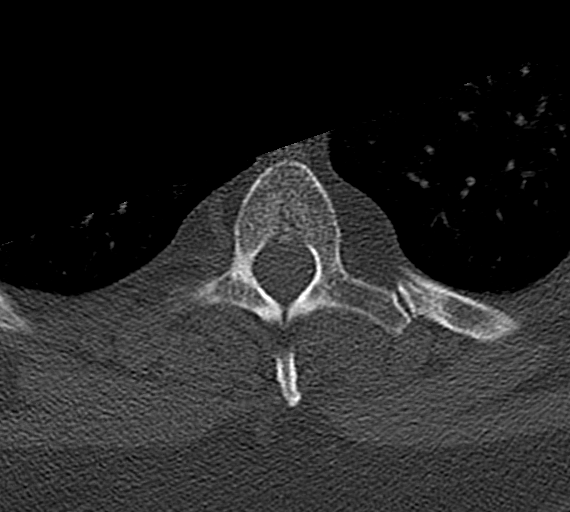
[im 47/109  bone]
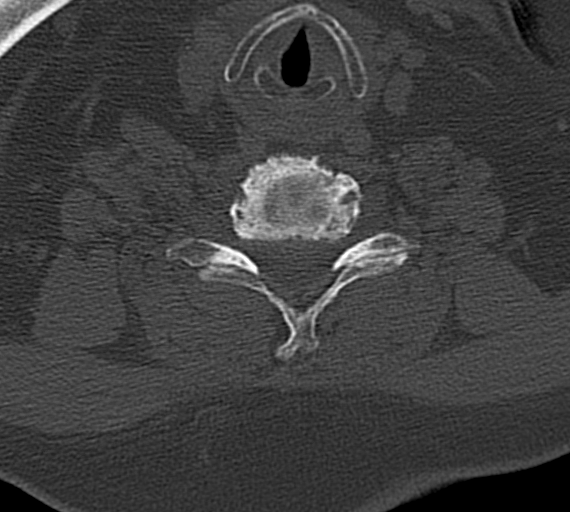
[im 62/109  bone]
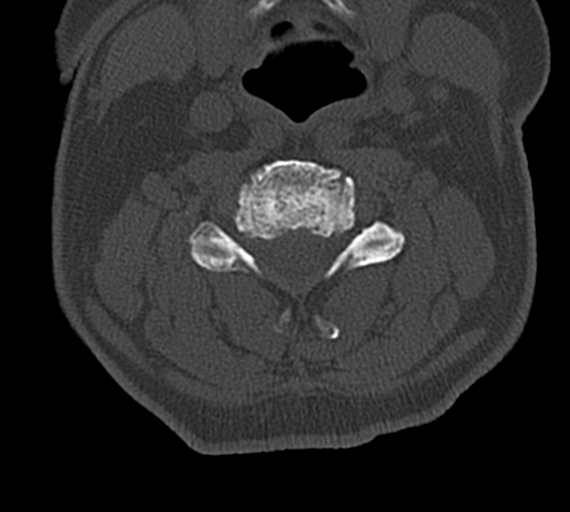
[im 93/109  bone]
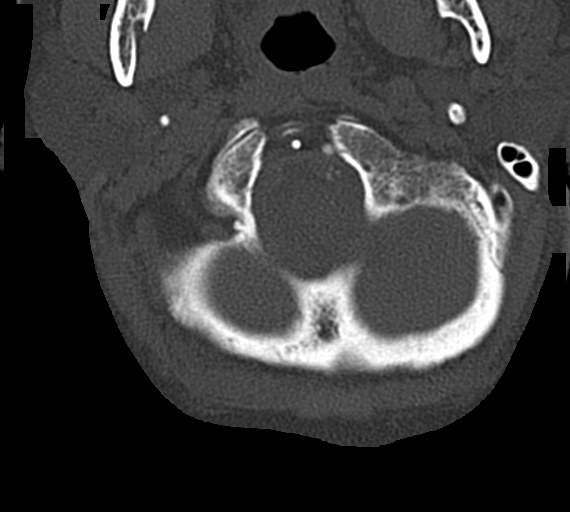

[Series 7: sagittal bone · sagittal · 0.31mm/px · 5 of 67 slices shown, 6 images]
[im 23/67  bone]
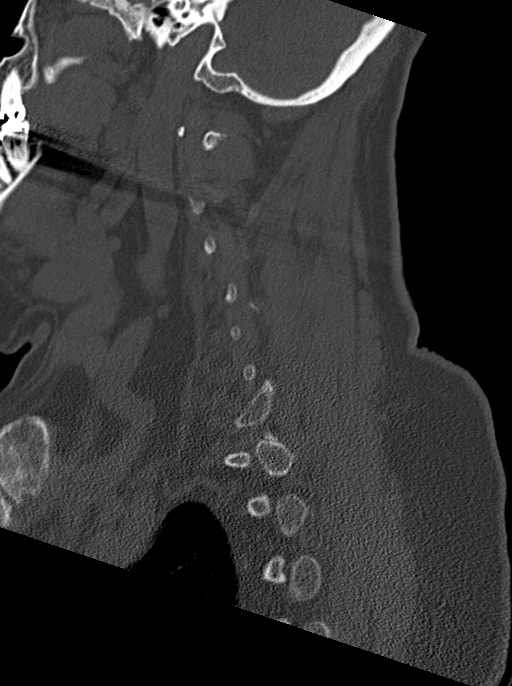
[im 28/67  bone]
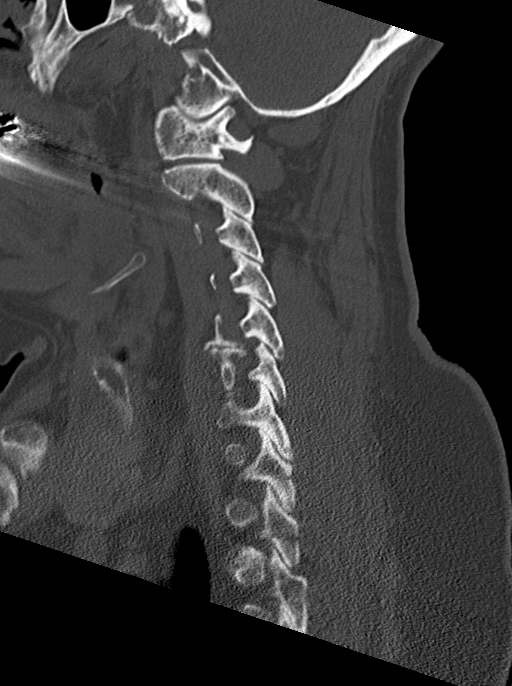
[im 34/67  soft-tissue]
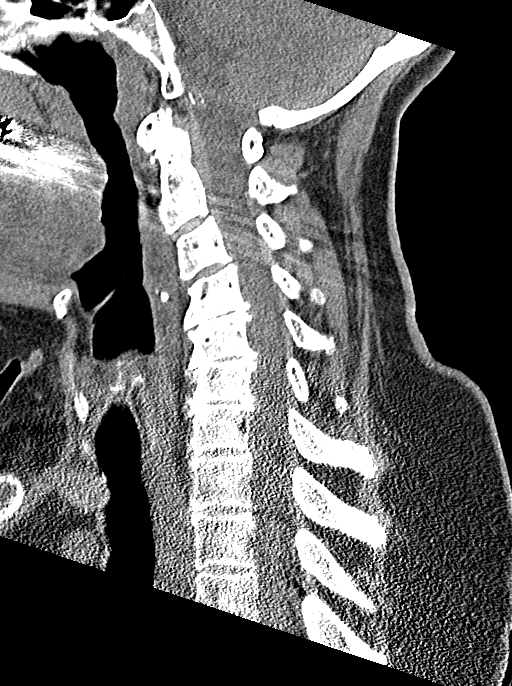
[im 34/67  bone]
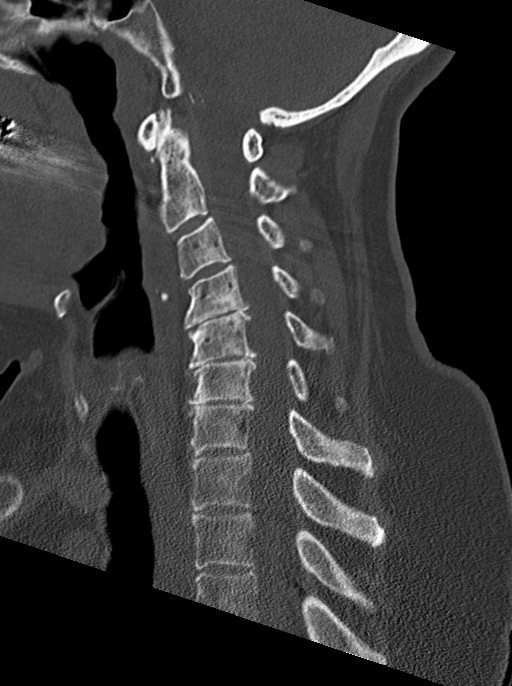
[im 39/67  bone]
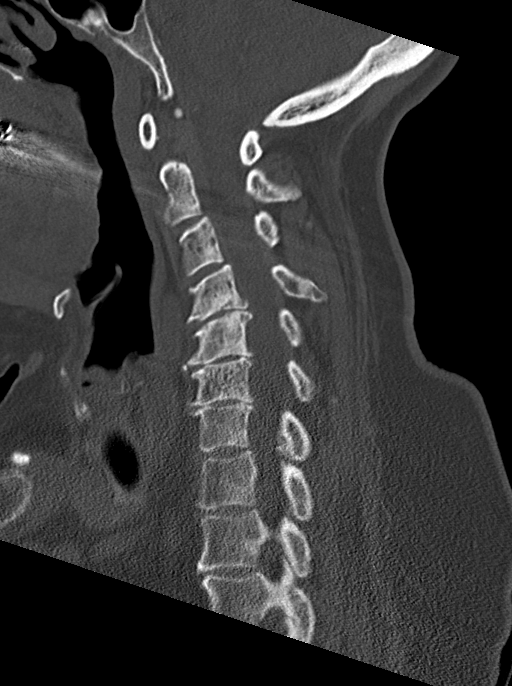
[im 45/67  bone]
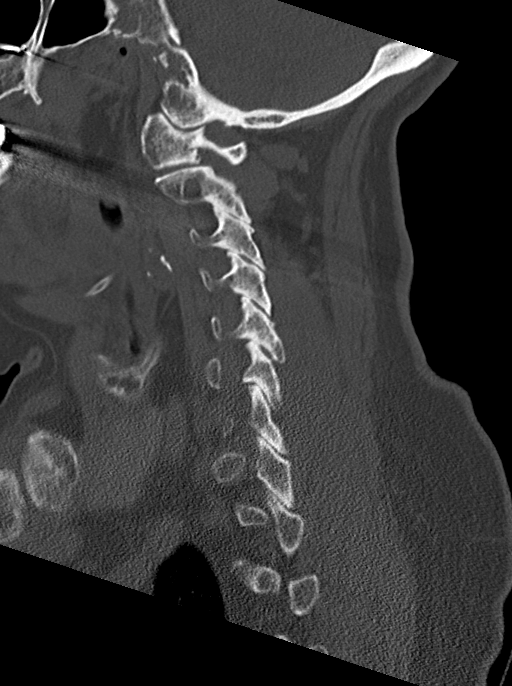

[Series 8: coronal bone · coronal · 0.28mm/px · 3 of 72 slices shown]
[im 16/72  bone]
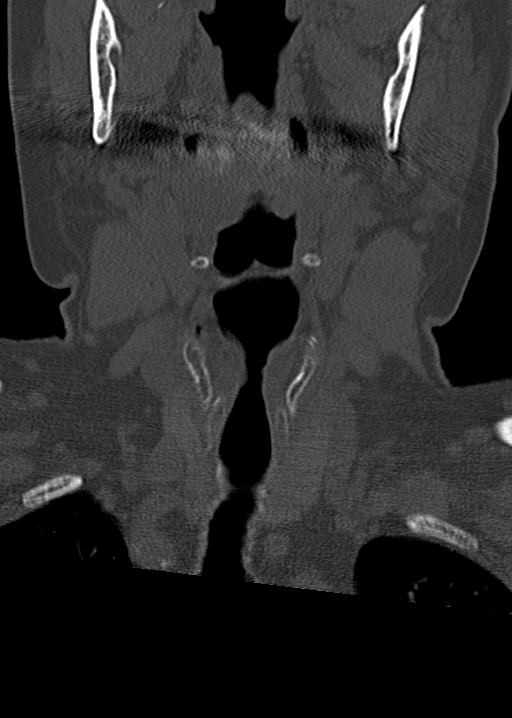
[im 29/72  bone]
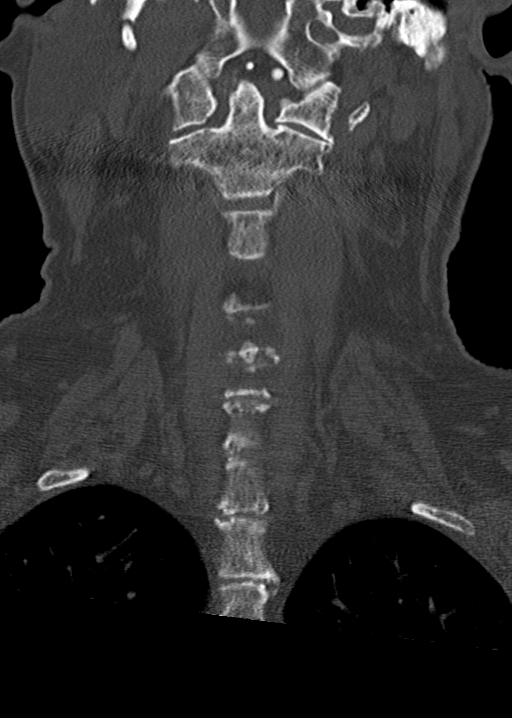
[im 43/72  bone]
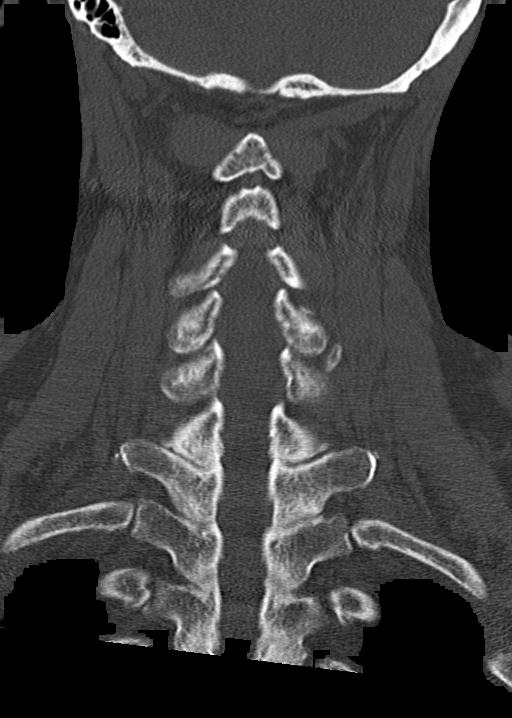

[12 of 35 positions shown; findings below may reference images not displayed]

FINDINGS: CT HEAD FINDINGS

Brain: No evidence of acute infarction, hemorrhage, hydrocephalus,
extra-axial collection or mass lesion/mass effect. Stable atrophy
and chronic microvascular ischemic changes.

Vascular: Calcified atherosclerosis at the skull base. No hyperdense
vessel.

Skull: Normal. Negative for fracture or focal lesion.

Sinuses/Orbits: No acute finding. Minimal secretions within the
right maxillary sinus. Unchanged small retention cyst in the right
anterior ethmoid air cells.

Other: None.

CT CERVICAL SPINE FINDINGS

Alignment: Reversal of the normal cervical lordosis. No traumatic
malalignment.

Skull base and vertebrae: No acute fracture. No primary bone lesion
or focal pathologic process.

Soft tissues and spinal canal: No prevertebral fluid or swelling. No
visible canal hematoma.

Disc levels: Multilevel disc height loss and uncovertebral
hypertrophy, moderate to severe from C4-C5 through C6-C7. Scattered
mild facet arthropathy throughout the cervical spine.

Upper chest: Negative.

Other: Medial deviation of both cervical internal carotid arteries.
IMPRESSION: 1. No acute intracranial abnormality. Stable atrophy and chronic
microvascular ischemic changes.
2. No acute cervical spine fracture or traumatic listhesis. Moderate
to severe lower cervical spondylosis.

## 2022-11-25 IMAGING — CT CT HEAD W/O CM
4 series · 16 of 47 positions shown, 18 images · non-contrast
Comparison: CT head dated April 11, 2021.

CLINICAL DATA: Fall.  Hit head on dresser.  On Eliquis.



[Series 2: head wo · axial · 0.41mm/px · z∈[+436,+546]mm · 7 of 30 slices shown, 9 images]
[im 4/30  brain]
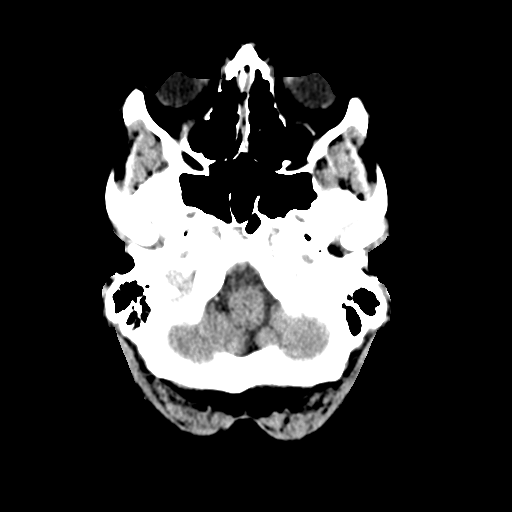
[im 4/30  bone]
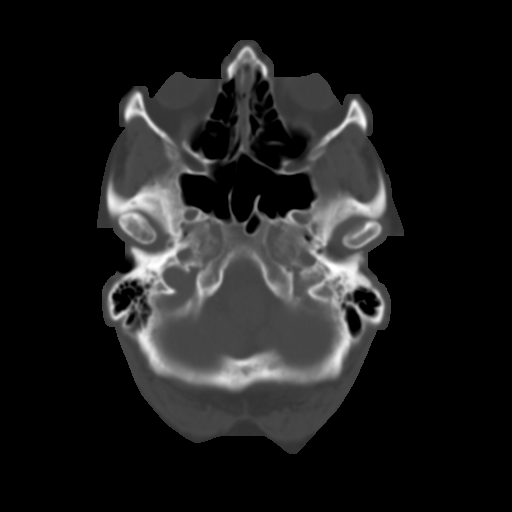
[im 8/30  brain]
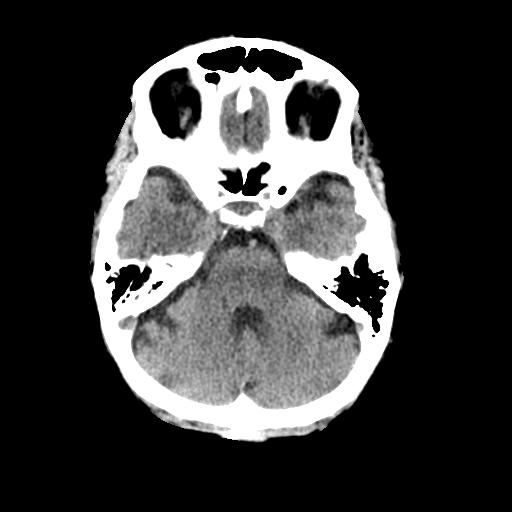
[im 11/30  brain]
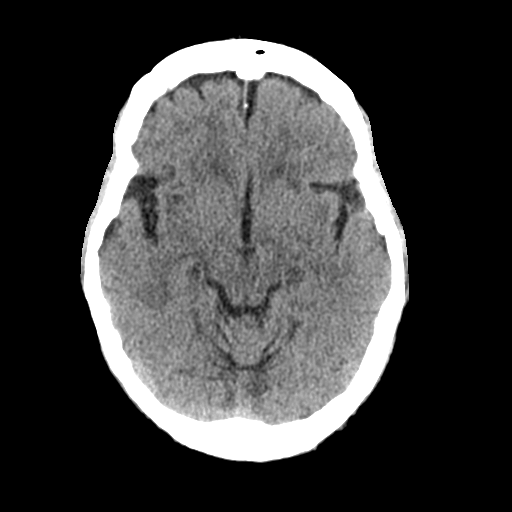
[im 15/30  brain]
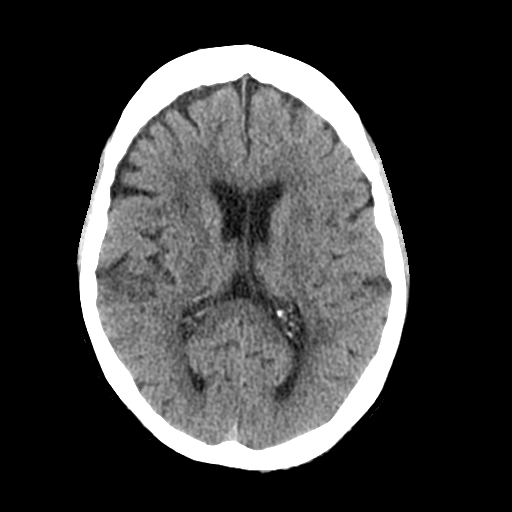
[im 19/30  brain]
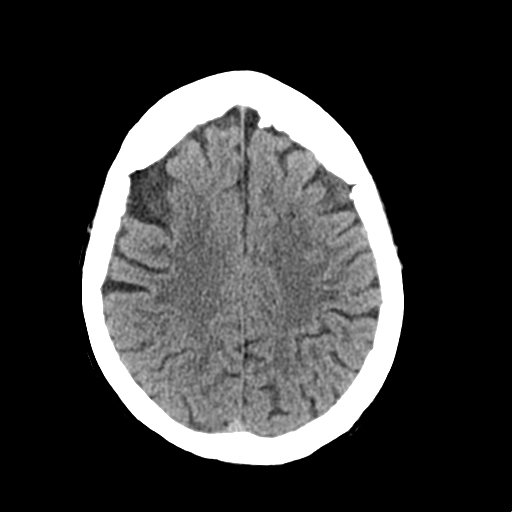
[im 19/30  bone]
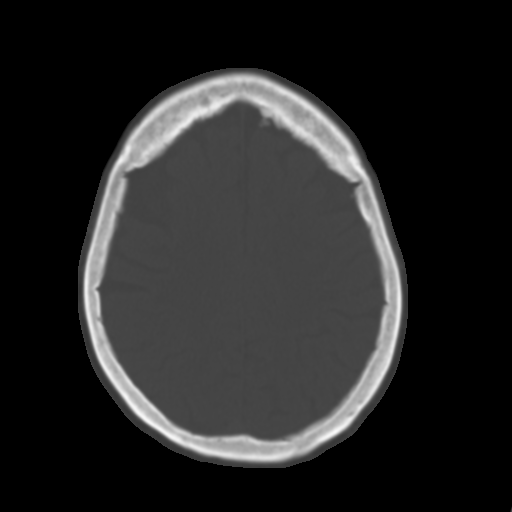
[im 22/30  brain]
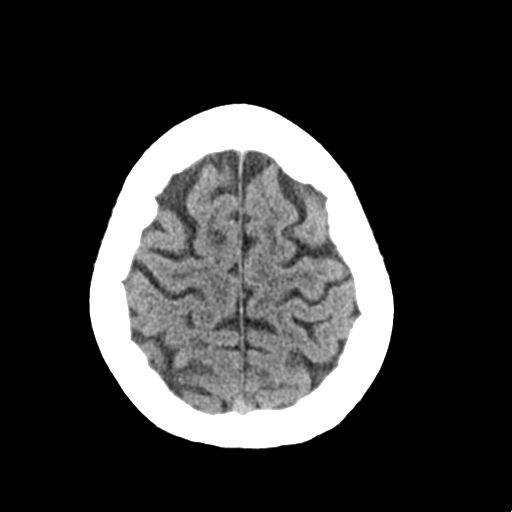
[im 26/30  brain]
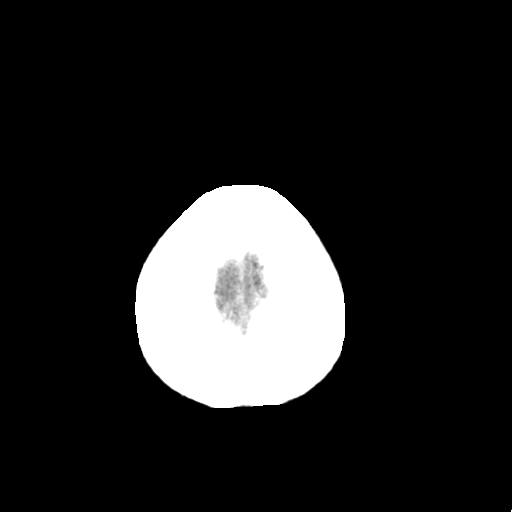

[Series 3: head bone · axial · 0.41mm/px · z∈[+435,+465]mm · 3 of 75 slices shown]
[im 8/75  bone]
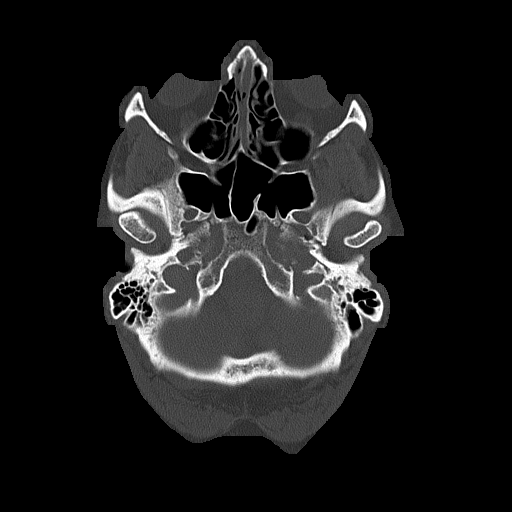
[im 15/75  bone]
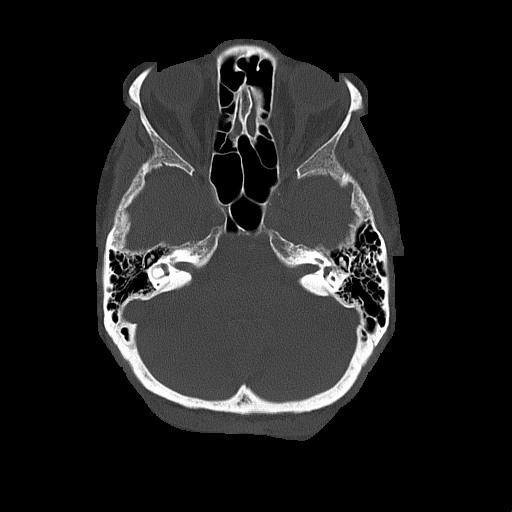
[im 23/75  bone]
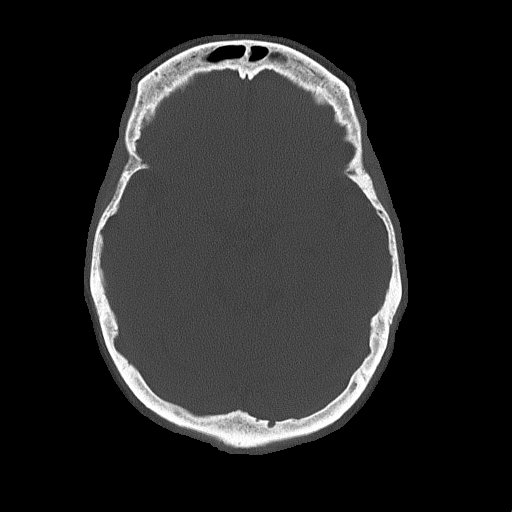

[Series 4: coronal soft tissue · coronal · 0.30mm/px · 3 of 68 slices shown]
[im 23/68  brain]
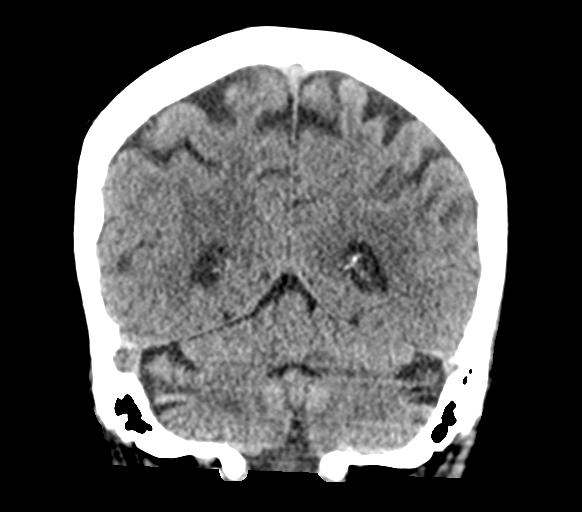
[im 30/68  brain]
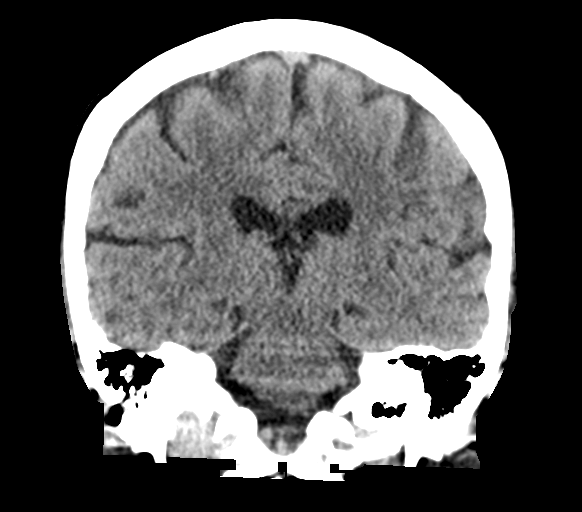
[im 38/68  brain]
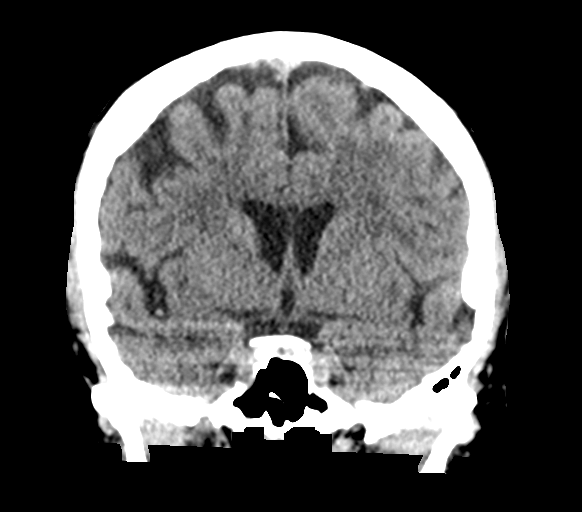

[Series 5: sagittal soft tissue · sagittal · 0.32mm/px · 3 of 55 slices shown]
[im 19/55  brain]
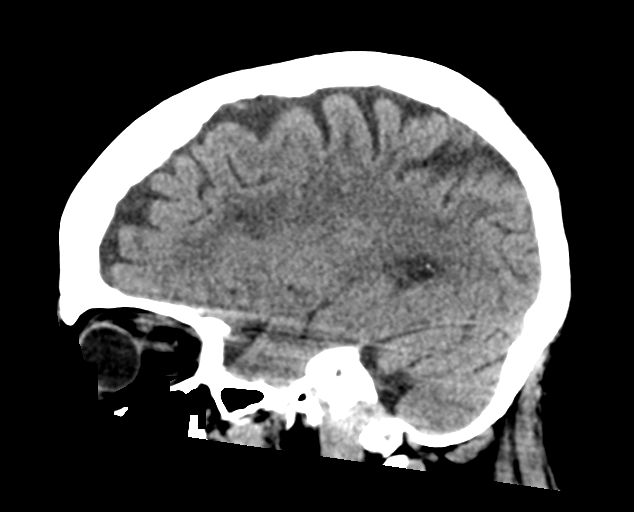
[im 28/55  brain]
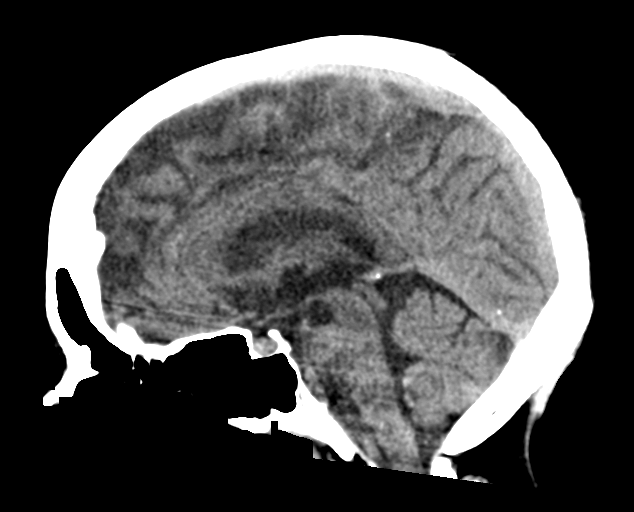
[im 37/55  brain]
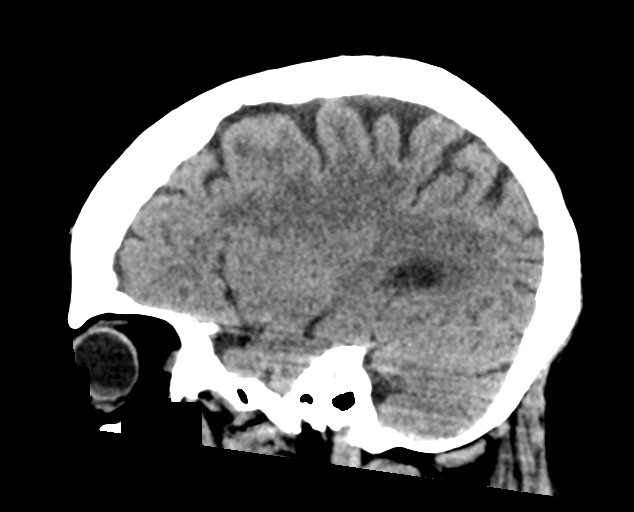

[16 of 47 positions shown; findings below may reference images not displayed]

FINDINGS: CT HEAD FINDINGS

Brain: No evidence of acute infarction, hemorrhage, hydrocephalus,
extra-axial collection or mass lesion/mass effect. Stable atrophy
and chronic microvascular ischemic changes.

Vascular: Calcified atherosclerosis at the skull base. No hyperdense
vessel.

Skull: Normal. Negative for fracture or focal lesion.

Sinuses/Orbits: No acute finding. Minimal secretions within the
right maxillary sinus. Unchanged small retention cyst in the right
anterior ethmoid air cells.

Other: None.

CT CERVICAL SPINE FINDINGS

Alignment: Reversal of the normal cervical lordosis. No traumatic
malalignment.

Skull base and vertebrae: No acute fracture. No primary bone lesion
or focal pathologic process.

Soft tissues and spinal canal: No prevertebral fluid or swelling. No
visible canal hematoma.

Disc levels: Multilevel disc height loss and uncovertebral
hypertrophy, moderate to severe from C4-C5 through C6-C7. Scattered
mild facet arthropathy throughout the cervical spine.

Upper chest: Negative.

Other: Medial deviation of both cervical internal carotid arteries.
IMPRESSION: 1. No acute intracranial abnormality. Stable atrophy and chronic
microvascular ischemic changes.
2. No acute cervical spine fracture or traumatic listhesis. Moderate
to severe lower cervical spondylosis.

## 2022-11-26 ENCOUNTER — Telehealth: Payer: Self-pay | Admitting: *Deleted

## 2022-11-26 ENCOUNTER — Encounter: Payer: Self-pay | Admitting: *Deleted

## 2022-11-26 NOTE — Patient Outreach (Signed)
  Care Coordination   Initial Visit Note   11/27/2022 Name: TRANIYA PRICHETT MRN: [id number] DOB: 07/20/1946  CORALIE STANKE is a 77 y.o. year old female who sees Valerie Roys, DO for primary care. I spoke with  [name] by phone today.  What matters to the patients health and wellness today?  Continuing to manage diabetes keeping it A1C down.     Goals Addressed             This Visit's Progress    Effective management of DM       Care Coordination Interventions: Provided education to patient about basic DM disease process Reviewed medications with patient and discussed importance of medication adherence Counseled on importance of regular laboratory monitoring as prescribed Reviewed scheduled/upcoming provider appointments including: Eye appointment next week, Nephrology on 2/13, PCP on 2/23 Advised patient, providing education and rationale, to check cbg 2-3 times a day and record, calling PCP for findings outside established parameters Screening for signs and symptoms of depression related to chronic disease state  Assessed social determinant of health barriers Reviewed recent blood sugars, range 80-150s.  Using Dexcom or monitoring Discussed A1C decrease from 11.3 down to 8.3.  Aware of goal of less than 7         SDOH assessments and interventions completed:  Yes  SDOH Interventions Today    Flowsheet Row Most Recent Value  SDOH Interventions   Food Insecurity Interventions Intervention Not Indicated  Housing Interventions Intervention Not Indicated  Transportation Interventions Intervention Not Indicated        Care Coordination Interventions:  Yes, provided   Follow up plan: Follow up call scheduled for 3/1    Encounter Outcome:  Pt. Visit Completed   Valente David, RN, MSN, [facility] Care Management Coordinator [phone]

## 2022-11-28 DIAGNOSIS — N184 Chronic kidney disease, stage 4 (severe): Secondary | ICD-10-CM | POA: Diagnosis not present

## 2022-11-28 DIAGNOSIS — E876 Hypokalemia: Secondary | ICD-10-CM | POA: Diagnosis not present

## 2022-11-28 DIAGNOSIS — E1122 Type 2 diabetes mellitus with diabetic chronic kidney disease: Secondary | ICD-10-CM | POA: Diagnosis not present

## 2022-11-28 DIAGNOSIS — I890 Lymphedema, not elsewhere classified: Secondary | ICD-10-CM | POA: Diagnosis not present

## 2022-11-28 DIAGNOSIS — R600 Localized edema: Secondary | ICD-10-CM | POA: Diagnosis not present

## 2022-12-04 DIAGNOSIS — R600 Localized edema: Secondary | ICD-10-CM | POA: Diagnosis not present

## 2022-12-04 DIAGNOSIS — E1122 Type 2 diabetes mellitus with diabetic chronic kidney disease: Secondary | ICD-10-CM | POA: Diagnosis not present

## 2022-12-04 DIAGNOSIS — N184 Chronic kidney disease, stage 4 (severe): Secondary | ICD-10-CM | POA: Diagnosis not present

## 2022-12-04 DIAGNOSIS — E876 Hypokalemia: Secondary | ICD-10-CM | POA: Diagnosis not present

## 2022-12-04 DIAGNOSIS — I890 Lymphedema, not elsewhere classified: Secondary | ICD-10-CM | POA: Diagnosis not present

## 2022-12-06 ENCOUNTER — Encounter: Payer: Medicare PPO | Admitting: Family Medicine

## 2022-12-13 ENCOUNTER — Ambulatory Visit (INDEPENDENT_AMBULATORY_CARE_PROVIDER_SITE_OTHER): Payer: Medicare PPO | Admitting: Family Medicine

## 2022-12-13 ENCOUNTER — Encounter: Payer: Self-pay | Admitting: Family Medicine

## 2022-12-13 VITALS — BP 101/69 | HR 84 | Temp 98.3°F | Ht 61.6 in | Wt 162.3 lb

## 2022-12-13 DIAGNOSIS — I5033 Acute on chronic diastolic (congestive) heart failure: Secondary | ICD-10-CM

## 2022-12-13 DIAGNOSIS — E1121 Type 2 diabetes mellitus with diabetic nephropathy: Secondary | ICD-10-CM

## 2022-12-13 DIAGNOSIS — Z0000 Encounter for general adult medical examination without abnormal findings: Secondary | ICD-10-CM

## 2022-12-13 DIAGNOSIS — D692 Other nonthrombocytopenic purpura: Secondary | ICD-10-CM | POA: Diagnosis not present

## 2022-12-13 DIAGNOSIS — E1122 Type 2 diabetes mellitus with diabetic chronic kidney disease: Secondary | ICD-10-CM

## 2022-12-13 DIAGNOSIS — Z1231 Encounter for screening mammogram for malignant neoplasm of breast: Secondary | ICD-10-CM

## 2022-12-13 DIAGNOSIS — I270 Primary pulmonary hypertension: Secondary | ICD-10-CM | POA: Diagnosis not present

## 2022-12-13 DIAGNOSIS — N183 Chronic kidney disease, stage 3 unspecified: Secondary | ICD-10-CM | POA: Diagnosis not present

## 2022-12-13 DIAGNOSIS — Z794 Long term (current) use of insulin: Secondary | ICD-10-CM

## 2022-12-13 DIAGNOSIS — I129 Hypertensive chronic kidney disease with stage 1 through stage 4 chronic kidney disease, or unspecified chronic kidney disease: Secondary | ICD-10-CM

## 2022-12-13 DIAGNOSIS — E782 Mixed hyperlipidemia: Secondary | ICD-10-CM

## 2022-12-13 DIAGNOSIS — R0781 Pleurodynia: Secondary | ICD-10-CM

## 2022-12-13 LAB — URINALYSIS, ROUTINE W REFLEX MICROSCOPIC
Bilirubin, UA: NEGATIVE
Ketones, UA: NEGATIVE
Leukocytes,UA: NEGATIVE
Nitrite, UA: NEGATIVE
Protein,UA: NEGATIVE
RBC, UA: NEGATIVE
Specific Gravity, UA: <1.005 — ABNORMAL LOW (ref 1.005–1.030)
Urobilinogen, Ur: 0.2 mg/dL (ref 0.2–1.0)
pH, UA: 5.5 (ref 5.0–7.5)

## 2022-12-13 LAB — MICROALBUMIN, URINE WAIVED
Creatinine, Urine Waived: 10 mg/dL (ref 10–300)
Microalb, Ur Waived: 10 mg/L (ref 0–19)
Microalb/Creat Ratio: <30 mg/g (ref <30)

## 2022-12-13 LAB — BAYER DCA HB A1C WAIVED: HB A1C (BAYER DCA - WAIVED): 7.9 % — ABNORMAL HIGH (ref 4.8–5.6)

## 2022-12-13 MED ORDER — POTASSIUM CHLORIDE CRYS ER 20 MEQ PO TBCR: 40.0000 meq | EXTENDED_RELEASE_TABLET | Freq: Two times a day (BID) | ORAL | 1 refills | Status: DC

## 2022-12-13 MED ORDER — EMPAGLIFLOZIN 25 MG PO TABS: ORAL_TABLET | ORAL | 1 refills | Status: DC

## 2022-12-13 MED ORDER — FOLIC ACID 1 MG PO TABS: 1.0000 mg | ORAL_TABLET | Freq: Every day | ORAL | 2 refills | Status: DC

## 2022-12-13 MED ORDER — METOPROLOL SUCCINATE ER 25 MG PO TB24: 12.5000 mg | ORAL_TABLET | Freq: Every day | ORAL | 1 refills | Status: DC

## 2022-12-13 MED ORDER — TORSEMIDE 20 MG PO TABS: ORAL_TABLET | ORAL | 1 refills | Status: DC

## 2022-12-13 MED ORDER — "INSULIN SYRINGE 31G X 5/16"" 1 ML MISC": 1.0000 | Freq: Three times a day (TID) | 12 refills | Status: DC

## 2022-12-13 NOTE — Assessment & Plan Note (Signed)
Rechecking labs today. Await results. Treat as needed.  °

## 2022-12-13 NOTE — Patient Instructions (Signed)
Your mammogram is scheduled for 01/01/23 at 2:40 pm at [facility] at [facility].  Address: [address], [place], [place] [address] Phone: [phone]

## 2022-12-13 NOTE — Assessment & Plan Note (Signed)
Under good control on current regimen. Continue current regimen. Continue to monitor. Call with any concerns. Refills given. Labs drawn today.   

## 2022-12-13 NOTE — Progress Notes (Signed)
BP 101/69   Pulse 84   Temp 98.3 F (36.8 C) (Oral)   Ht 5' 1.6" (1.565 m)   Wt 162 lb 4.8 oz (73.6 kg)   SpO2 93%   BMI 30.07 kg/m    Subjective:    Patient ID: Karen Farley, female    DOB: [birth year], 77 y.o.   MRN: [id number]  HPI: Karen Farley is a 77 y.o. female presenting on 12/13/2022 for comprehensive medical examination. Current medical complaints include:  DIABETES Hypoglycemic episodes:no Polydipsia/polyuria: no Visual disturbance: no Chest pain: no Paresthesias: no Glucose Monitoring: yes  Accucheck frequency: TID Taking Insulin?: yes  Long acting insulin: 48 units Blood Pressure Monitoring: a few times a month Retinal Examination: Up to Date Foot Exam: Up to Date Diabetic Education: Completed Pneumovax: Up to Date Influenza: Up to Date Aspirin: no  HYPERTENSION / [place] Satisfied with current treatment? yes Duration of hypertension: chronic BP monitoring frequency: not checking BP medication side effects: no Past BP meds: spironalactone, metoprolol, metolazone, torsemide Duration of hyperlipidemia: chronic Cholesterol medication side effects: no Cholesterol supplements: none Past cholesterol medications: crestor Medication compliance: excellent compliance Aspirin: no Recent stressors: no Recurrent headaches: no Visual changes: no Palpitations: no Dyspnea: no Chest pain: no Lower extremity edema: no Dizzy/lightheaded: no  She currently lives with: husband Menopausal Symptoms: no  Depression Screen done today and results listed below:     12/13/2022    1:08 PM 09/04/2022    1:27 PM 07/23/2022   12:17 PM 07/23/2022   12:07 PM 06/28/2022    1:29 PM  Depression screen PHQ 2/9  Decreased Interest 0 0 0 0 0  Down, Depressed, Hopeless 0 0 0 0 0  PHQ - 2 Score 0 0 0 0 0  Altered sleeping 0 '2 1 1   '$ Tired, decreased energy 0 0 0 0   Change in appetite 0 0 0 0   Feeling bad or failure about yourself  0 0 0 0   Trouble  concentrating 0 0 0 0   Moving slowly or fidgety/restless 0 0 0 0   Suicidal thoughts 0 0 0 0   PHQ-9 Score 0 '2 1 1   '$ Difficult doing work/chores Not difficult at all Not difficult at all Not difficult at all      Past Medical History:  Past Medical History:  Diagnosis Date   A-fib [facility])    CHF (congestive heart failure) ([facility])    Chronic heart failure with preserved ejection fraction (HFpEF) ([place])    a. 11/2019 Echo: EF 60-65%; b. 07/2021 Echo: EF 60-65%.   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 07/2021 req 2u prbcs. Eval deferred given resolution of bleeding and cardiac issues.   Hyperlipidemia    Hypertension    Lymphedema    Morbid obesity ([place])    Osteoarthritis of both knees    Osteopenia    Pericardial effusion    a. 07/2021 Echo: Nl EF. Large pericardial effusion-->s/p pericardiocentesis of 735m; b. 08/23/2021 Echo: EF 60-65%, mild LVH, Nl RV fxn, sev dil LA, moderate pericardial effusion. L pleural effusion.   Permanent atrial fibrillation (HCC)    Pleural effusion, left    a. 07/2021 s/p thoracentesis - 6051m    Surgical History:  Past Surgical History:  Procedure Laterality Date   BASAL CELL CARCINOMA EXCISION  11/01/2022   CATARACT EXTRACTION W/PHACO Left 10/09/2021   Procedure: CATARACT EXTRACTION PHACO AND INTRAOCULAR LENS PLACEMENT (IOC) LEFT DIABETIC 4.84 00:37.8;  Surgeon: Birder Robson, MD;  Location: [facility];  Service: Ophthalmology;  Laterality: Left;  Diabetic   CATARACT EXTRACTION W/PHACO Right 10/30/2021   Procedure: CATARACT EXTRACTION PHACO AND INTRAOCULAR LENS PLACEMENT (IOC) RIGHT DIABETIC 5.65 00:38.5;  Surgeon: Birder Robson, MD;  Location: [facility];  Service: Ophthalmology;  Laterality: Right;  Diabetic    CHOLECYSTECTOMY     DILATION AND CURETTAGE OF UTERUS     IR FLUORO GUIDE CV LINE RIGHT  08/16/2021   IR THORACENTESIS ASP PLEURAL SPACE W/IMG GUIDE  08/16/2021   PERICARDIOCENTESIS N/A 08/05/2021   Procedure: PERICARDIOCENTESIS;  Surgeon: Isaias Cowman, MD;  Location: [facility] CV LAB;  Service: Cardiovascular;  Laterality: N/A;   TEAR DUCT PROBING WITH STRABISMUS REPAIR Right    TONSILLECTOMY      Medications:  Current Outpatient Medications on File Prior to Visit  Medication Sig   ACCU-CHEK GUIDE test strip USE TO CHECK BLOOD SUGAR DAILY   Cholecalciferol 125 MCG (5000 UT) TABS Take by mouth.   ELIQUIS 5 MG TABS tablet TAKE 1 TABLET BY MOUTH TWICE DAILY   insulin aspart (NOVOLOG) 100 UNIT/ML injection Inject 4 Units into the skin 3 (three) times daily with meals. Increase 1 unit every 50 points above 150.   Insulin Pen Needle (PEN NEEDLES) 30G X 8 MM MISC 1 each by Does not apply route daily.   Insulin Pen Needle (ULTRACARE PEN NEEDLES) 32G X 4 MM MISC USE 1 PEN ONCE DAILY   latanoprost (XALATAN) 0.005 % ophthalmic solution Place 1 drop into both eyes at bedtime.   metolazone (ZAROXOLYN) 2.5 MG tablet TAKE 1 TABLET BY MOUTH EVERY MONDAY WEDNESDAY  AND FRIDAY   metroNIDAZOLE (METROGEL) 1 % gel Apply to face every morning.   midodrine (PROAMATINE) 10 MG tablet Take 1 tablet (10 mg total) by mouth 3 (three) times daily with meals.   Multiple Vitamin (MULTIVITAMIN WITH MINERALS) TABS tablet Take 1 tablet by mouth every other day.   pantoprazole (PROTONIX) 40 MG tablet TAKE 1 TABLET BY MOUTH TWICE DAILY   rosuvastatin (CRESTOR) 5 MG tablet Take 1 tablet (5 mg total) by mouth once a week.   spironolactone (ALDACTONE) 50 MG tablet Take by mouth.   TOUJEO MAX SOLOSTAR 300 UNIT/ML Solostar Pen INJECT 48 UNITS SUBCUTANEOUSLY AT BEDTIME   No current facility-administered medications on file prior to visit.    Allergies:  No Known Allergies  Social History:  Social History    Socioeconomic History   Marital status: Married    Spouse name: Not on file   Number of children: Not on file   Years of education: Not on file   Highest education level: Not on file  Occupational History   Not on file  Tobacco Use   Smoking status: Never   Smokeless tobacco: Never  Vaping Use   Vaping Use: Never used  Substance and Sexual Activity   Alcohol use: Not Currently    Alcohol/week: 0.0 standard drinks of alcohol    Comment: on rare occasion   Drug use: No   Sexual activity: Not Currently  Other Topics Concern   Not on file  Social History Narrative   Not on file   Social Determinants of Health   Financial Resource Strain: Low Risk  (07/23/2022)  Overall Financial Resource Strain (CARDIA)    Difficulty of Paying Living Expenses: Not hard at all  Food Insecurity: No Food Insecurity (11/26/2022)   Hunger Vital Sign    Worried About Running Out of Food in the Last Year: Never true    Ran Out of Food in the Last Year: Never true  Transportation Needs: No Transportation Needs (11/26/2022)   PRAPARE - Hydrologist (Medical): No    Lack of Transportation (Non-Medical): No  Physical Activity: Sufficiently Active (07/23/2022)   Exercise Vital Sign    Days of Exercise per Week: 3 days    Minutes of Exercise per Session: 60 min  Stress: No Stress Concern Present (07/23/2022)   [place]    Feeling of Stress : Not at all  Social Connections: Moderately Integrated (07/23/2022)   Social Connection and Isolation Panel [NHANES]    Frequency of Communication with Friends and Family: Twice a week    Frequency of Social Gatherings with Friends and Family: Once a week    Attends Religious Services: Never    Marine scientist or Organizations: Yes    Attends Music therapist: More than 4 times per year    Marital Status: Married  Human resources officer Violence: Not At  Risk (07/23/2022)   Humiliation, Afraid, Rape, and Kick questionnaire    Fear of Current or Ex-Partner: No    Emotionally Abused: No    Physically Abused: No    Sexually Abused: No   Social History   Tobacco Use  Smoking Status Never  Smokeless Tobacco Never   Social History   Substance and Sexual Activity  Alcohol Use Not Currently   Alcohol/week: 0.0 standard drinks of alcohol   Comment: on rare occasion    Family History:  Family History  Problem Relation Age of Onset   Heart disease Mother        CHF   Osteoporosis Mother    Breast cancer Mother    Heart disease Father 2   Cancer Brother        Colon CA- 2002- Youngest brother   Parkinson's disease Brother        Younger Brother   Heart disease Brother        2 brothers   Diabetes Brother    Cancer Brother    Breast cancer Maternal Grandmother     Past medical history, surgical history, medications, allergies, family history and social history reviewed with patient today and changes made to appropriate areas of the chart.   Review of Systems  Constitutional: Negative.   HENT: Negative.    Eyes: Negative.   Respiratory: Negative.    Cardiovascular: Negative.   Gastrointestinal:  Positive for constipation. Negative for abdominal pain, blood in stool, diarrhea, heartburn, melena, nausea and vomiting.  Genitourinary: Negative.   Musculoskeletal:  Positive for joint pain. Negative for back pain, falls, myalgias and neck pain.  Skin: Negative.   Neurological: Negative.   Endo/Heme/Allergies:  Negative for environmental allergies and polydipsia. Bruises/bleeds easily.  Psychiatric/Behavioral: Negative.     All other ROS negative except what is listed above and in the HPI.      Objective:    BP 101/69   Pulse 84   Temp 98.3 F (36.8 C) (Oral)   Ht 5' 1.6" (1.565 m)   Wt 162 lb 4.8 oz (73.6 kg)   SpO2 93%   BMI 30.07 kg/m   Wt  Readings from Last 3 Encounters:  12/13/22 162 lb 4.8 oz (73.6 kg)   09/04/22 162 lb (73.5 kg)  08/26/22 162 lb (73.5 kg)    Physical Exam Vitals and nursing note reviewed.  Constitutional:      General: She is not in acute distress.    Appearance: Normal appearance. She is obese. She is not ill-appearing, toxic-appearing or diaphoretic.  HENT:     Head: Normocephalic and atraumatic.     Right Ear: Tympanic membrane, ear canal and external ear normal. There is no impacted cerumen.     Left Ear: Tympanic membrane, ear canal and external ear normal. There is no impacted cerumen.     Nose: Nose normal. No congestion or rhinorrhea.     Mouth/Throat:     Mouth: Mucous membranes are moist.     Pharynx: Oropharynx is clear. No oropharyngeal exudate or posterior oropharyngeal erythema.  Eyes:     General: No scleral icterus.       Right eye: No discharge.        Left eye: No discharge.     Extraocular Movements: Extraocular movements intact.     Conjunctiva/sclera: Conjunctivae normal.     Pupils: Pupils are equal, round, and reactive to light.  Neck:     Vascular: No carotid bruit.  Cardiovascular:     Rate and Rhythm: Normal rate and regular rhythm.     Pulses: Normal pulses.     Heart sounds: No murmur heard.    No friction rub. No gallop.  Pulmonary:     Effort: Pulmonary effort is normal. No respiratory distress.     Breath sounds: Normal breath sounds. No stridor. No wheezing, rhonchi or rales.  Chest:     Chest wall: No tenderness.  Abdominal:     General: Abdomen is flat. Bowel sounds are normal. There is no distension.     Palpations: Abdomen is soft. There is no mass.     Tenderness: There is no abdominal tenderness. There is no right CVA tenderness, left CVA tenderness, guarding or rebound.     Hernia: No hernia is present.  Genitourinary:    Comments: Breast and pelvic exams deferred with shared decision making Musculoskeletal:        General: No swelling, tenderness, deformity or signs of injury.     Cervical back: Normal range of  motion and neck supple. No rigidity. No muscular tenderness.     Right lower leg: No edema.     Left lower leg: No edema.  Lymphadenopathy:     Cervical: No cervical adenopathy.  Skin:    General: Skin is warm and dry.     Capillary Refill: Capillary refill takes less than 2 seconds.     Coloration: Skin is not jaundiced or pale.     Findings: No bruising, erythema, lesion or rash.  Neurological:     General: No focal deficit present.     Mental Status: She is alert and oriented to person, place, and time. Mental status is at baseline.     Cranial Nerves: No cranial nerve deficit.     Sensory: No sensory deficit.     Motor: No weakness.     Coordination: Coordination normal.     Gait: Gait normal.     Deep Tendon Reflexes: Reflexes normal.  Psychiatric:        Mood and Affect: Mood normal.        Behavior: Behavior normal.        Thought  Content: Thought content normal.        Judgment: Judgment normal.     Results for orders placed or performed in visit on 09/04/22  Bayer DCA Hb A1c Waived  Result Value Ref Range   HB A1C (BAYER DCA - WAIVED) 8.3 (H) 4.8 - 5.6 %      Assessment & Plan:   Problem List Items Addressed This Visit       Cardiovascular and Mediastinum   Acute on chronic heart failure with preserved ejection fraction (HFpEF) (HCC)    Stable. Continue to monitor. Continue to follow with cardiology. Call with any concerns.       Relevant Medications   spironolactone (ALDACTONE) 50 MG tablet   torsemide (DEMADEX) 20 MG tablet   metoprolol succinate (TOPROL-XL) 25 MG 24 hr tablet   Other Relevant Orders   CBC with Differential/Platelet   Comprehensive metabolic panel   TSH   Pulmonary hypertension, primary ([place])    Stable. Continue to monitor. Continue to follow with cardiology. Call with any concerns.       Relevant Medications   spironolactone (ALDACTONE) 50 MG tablet   torsemide (DEMADEX) 20 MG tablet   metoprolol succinate (TOPROL-XL) 25 MG 24 hr  tablet   Other Relevant Orders   CBC with Differential/Platelet   Comprehensive metabolic panel   TSH   Senile purpura ([place])    Reassured patient. Continue to monitor.       Relevant Medications   spironolactone (ALDACTONE) 50 MG tablet   torsemide (DEMADEX) 20 MG tablet   metoprolol succinate (TOPROL-XL) 25 MG 24 hr tablet     Endocrine   Type 2 diabetes mellitus with renal complication (HCC)    Improved with A1c of 7.9 down from 8.3. Continue current regimen. Continue to monitor. Recheck 3 months       Relevant Medications   empagliflozin (JARDIANCE) 25 MG TABS tablet   Other Relevant Orders   Bayer DCA Hb A1c Waived   CBC with Differential/Platelet   Comprehensive metabolic panel   Urinalysis, Routine w reflex microscopic   TSH   Microalbumin, Urine Waived   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 diabetes mellitus ([place])    Rechecking labs today. Await results. Treat as needed.       Relevant Medications   empagliflozin (JARDIANCE) 25 MG TABS tablet   Other Relevant Orders   CBC with Differential/Platelet   Comprehensive metabolic panel   TSH     Genitourinary   Benign hypertensive renal disease    Under good control on current regimen. Continue current regimen. Continue to monitor. Call with any concerns. Refills given. Labs drawn today.       Relevant Orders   CBC with Differential/Platelet   Comprehensive metabolic panel   TSH   Microalbumin, Urine Waived     Other   Hyperlipidemia    Under good control on current regimen. Continue current regimen. Continue to monitor. Call with any concerns. Refills given. Labs drawn today.       Relevant Medications   spironolactone (ALDACTONE) 50 MG tablet   torsemide (DEMADEX) 20 MG tablet   metoprolol succinate (TOPROL-XL) 25 MG 24 hr tablet   Other Relevant Orders   CBC with Differential/Platelet   Comprehensive metabolic panel   Lipid Panel w/o Chol/HDL Ratio   TSH   Other Visit Diagnoses     Routine general medical  examination at a health care facility    -  Primary   Vaccines up to  date. Screening labs checked today. Mammogram ordered. DEXA up to date. Continue diet and exercise. Call with any concerns. Continue to monitor.   Rib tenderness       Will check x-ray. Await results.   Relevant Orders   DG Ribs Unilateral Right   Encounter for screening mammogram for malignant neoplasm of breast       Mammogram ordered today.   Relevant Orders   MM 3D SCREEN BREAST BILATERAL        Follow up plan: Return in about 3 months (around 03/13/2023).   LABORATORY TESTING:  - Pap smear: not applicable  IMMUNIZATIONS:   - Tdap: Tetanus vaccination status reviewed: last tetanus booster within 10 years. - Influenza: Up to date - Pneumovax: Up to date - Prevnar: Up to date - COVID: Up to date - HPV: Not applicable - Shingrix vaccine: Refused  SCREENING: -Mammogram: Ordered today  - Colonoscopy: Not applicable  - Bone Density: Up to date   PATIENT COUNSELING:   Advised to take 1 mg of folate supplement per day if capable of pregnancy.   Sexuality: Discussed sexually transmitted diseases, partner selection, use of condoms, avoidance of unintended pregnancy  and contraceptive alternatives.   Advised to avoid cigarette smoking.  I discussed with the patient that most people either abstain from alcohol or drink within safe limits (<=14/week and <=4 drinks/occasion for males, <=7/weeks and <= 3 drinks/occasion for females) and that the risk for alcohol disorders and other health effects rises proportionally with the number of drinks per week and how often a drinker exceeds daily limits.  Discussed cessation/primary prevention of drug use and availability of treatment for abuse.   Diet: Encouraged to adjust caloric intake to maintain  or achieve ideal body weight, to reduce intake of dietary saturated fat and total fat, to limit sodium intake by avoiding high sodium foods and not adding table salt, and to  maintain adequate dietary potassium and calcium preferably from fresh fruits, vegetables, and low-fat dairy products.    stressed the importance of regular exercise  Injury prevention: Discussed safety belts, safety helmets, smoke detector, smoking near bedding or upholstery.   Dental health: Discussed importance of regular tooth brushing, flossing, and dental visits.    NEXT PREVENTATIVE PHYSICAL DUE IN 1 YEAR. Return in about 3 months (around 03/13/2023).

## 2022-12-13 NOTE — Assessment & Plan Note (Signed)
Stable. Continue to monitor. Continue to follow with cardiology. Call with any concerns.

## 2022-12-13 NOTE — Assessment & Plan Note (Signed)
Improved with A1c of 7.9 down from 8.3. Continue current regimen. Continue to monitor. Recheck 3 months

## 2022-12-13 NOTE — Assessment & Plan Note (Signed)
Reassured patient. Continue to monitor.  

## 2022-12-14 LAB — LIPID PANEL W/O CHOL/HDL RATIO
Cholesterol, Total: 232 mg/dL — ABNORMAL HIGH (ref 100–199)
HDL: 53 mg/dL (ref >39)
LDL Chol Calc (NIH): 157 mg/dL — ABNORMAL HIGH (ref 0–99)
Triglycerides: 123 mg/dL (ref 0–149)
VLDL Cholesterol Cal: 22 mg/dL (ref 5–40)

## 2022-12-14 LAB — CBC WITH DIFFERENTIAL/PLATELET
Basophils Absolute: 0.1 10*3/uL (ref 0.0–0.2)
Basos: 1 %
EOS (ABSOLUTE): 0.1 10*3/uL (ref 0.0–0.4)
Eos: 1 %
Hematocrit: 51.1 % — ABNORMAL HIGH (ref 34.0–46.6)
Hemoglobin: 15.9 g/dL (ref 11.1–15.9)
Immature Grans (Abs): 0 10*3/uL (ref 0.0–0.1)
Immature Granulocytes: 0 %
Lymphocytes Absolute: 0.6 10*3/uL — ABNORMAL LOW (ref 0.7–3.1)
Lymphs: 8 %
MCH: 25.1 pg — ABNORMAL LOW (ref 26.6–33.0)
MCHC: 31.1 g/dL — ABNORMAL LOW (ref 31.5–35.7)
MCV: 81 fL (ref 79–97)
Monocytes Absolute: 0.7 10*3/uL (ref 0.1–0.9)
Monocytes: 8 %
Neutrophils Absolute: 6.7 10*3/uL (ref 1.4–7.0)
Neutrophils: 82 %
Platelets: 361 10*3/uL (ref 150–450)
RBC: 6.33 x10E6/uL — ABNORMAL HIGH (ref 3.77–5.28)
RDW: 15.5 % — ABNORMAL HIGH (ref 11.7–15.4)
WBC: 8.1 10*3/uL (ref 3.4–10.8)

## 2022-12-14 LAB — COMPREHENSIVE METABOLIC PANEL
ALT: 20 IU/L (ref 0–32)
AST: 20 IU/L (ref 0–40)
Albumin/Globulin Ratio: 1.9 (ref 1.2–2.2)
Albumin: 4.5 g/dL (ref 3.8–4.8)
Alkaline Phosphatase: 135 IU/L — ABNORMAL HIGH (ref 44–121)
BUN/Creatinine Ratio: 24 (ref 12–28)
BUN: 55 mg/dL — ABNORMAL HIGH (ref 8–27)
Bilirubin Total: 1.2 mg/dL (ref 0.0–1.2)
CO2: 25 mmol/L (ref 20–29)
Calcium: 10.3 mg/dL (ref 8.7–10.3)
Chloride: 89 mmol/L — ABNORMAL LOW (ref 96–106)
Creatinine, Ser: 2.33 mg/dL — ABNORMAL HIGH (ref 0.57–1.00)
Globulin, Total: 2.4 g/dL (ref 1.5–4.5)
Glucose: 211 mg/dL — ABNORMAL HIGH (ref 70–99)
Potassium: 4.3 mmol/L (ref 3.5–5.2)
Sodium: 134 mmol/L (ref 134–144)
Total Protein: 6.9 g/dL (ref 6.0–8.5)
eGFR: 21 mL/min/{1.73_m2} — ABNORMAL LOW (ref >59)

## 2022-12-14 LAB — TSH: TSH: 2.73 u[IU]/mL (ref 0.450–4.500)

## 2022-12-16 ENCOUNTER — Other Ambulatory Visit: Payer: Self-pay | Admitting: Family Medicine

## 2022-12-16 DIAGNOSIS — N289 Disorder of kidney and ureter, unspecified: Secondary | ICD-10-CM

## 2022-12-20 ENCOUNTER — Other Ambulatory Visit: Payer: Medicare PPO

## 2022-12-20 ENCOUNTER — Ambulatory Visit: Payer: Self-pay | Admitting: *Deleted

## 2022-12-20 DIAGNOSIS — N289 Disorder of kidney and ureter, unspecified: Secondary | ICD-10-CM | POA: Diagnosis not present

## 2022-12-20 NOTE — Patient Outreach (Signed)
  Care Coordination   Follow Up Visit Note   12/20/2022 Name: Karen Farley MRN: [id number] DOB: 1946/02/23  Karen Farley is a 77 y.o. year old female who sees Valerie Roys, DO for primary care. I spoke with  [name] by phone today.  What matters to the patients health and wellness today?  Patient report last PCP visit went well on 2/23, continues to work to manage diabetes and now kidney function.    Goals Addressed             This Visit's Progress    Effective management of DM   On track    Care Coordination Interventions: Provided education to patient about basic DM disease process Reviewed medications with patient and discussed importance of medication adherence Counseled on importance of regular laboratory monitoring as prescribed Reviewed scheduled/upcoming provider appointments including: mammogram on 3/13, nephrology on 4/29, cardiology on 5/6, and PCP on 5/24 Advised patient, providing education and rationale, to check cbg 2-3 times a day and record, calling PCP for findings outside established parameters Reviewed recent blood sugars, range 80-150s.  Using Dexcom or monitoring, today was 115 Discussed A1C decrease from 8.3 down to 7.9.  Aware of goal of less than 7, congratulated on success this far as it was as high as 11.3 Discussed recent increase in creatinine from 1.74 to 2.33, will have repeat labs today.  Discussed proper hydration and importance of kidney function         SDOH assessments and interventions completed:  No     Care Coordination Interventions:  Yes, provided   Follow up plan: Follow up call scheduled for 4/2    Encounter Outcome:  Pt. Visit Completed   Valente David, RN, MSN, [place] Care Management Care Management Coordinator [phone]

## 2022-12-21 LAB — BASIC METABOLIC PANEL
BUN/Creatinine Ratio: 25 (ref 12–28)
BUN: 56 mg/dL — ABNORMAL HIGH (ref 8–27)
CO2: 23 mmol/L (ref 20–29)
Calcium: 10.2 mg/dL (ref 8.7–10.3)
Chloride: 88 mmol/L — ABNORMAL LOW (ref 96–106)
Creatinine, Ser: 2.23 mg/dL — ABNORMAL HIGH (ref 0.57–1.00)
Glucose: 268 mg/dL — ABNORMAL HIGH (ref 70–99)
Potassium: 4.1 mmol/L (ref 3.5–5.2)
Sodium: 135 mmol/L (ref 134–144)
eGFR: 22 mL/min/{1.73_m2} — ABNORMAL LOW (ref >59)

## 2023-01-01 ENCOUNTER — Ambulatory Visit
Admission: RE | Admit: 2023-01-01 | Discharge: 2023-01-01 | Disposition: A | Discharge status: Home / Self Care | Payer: Medicare PPO | Source: Ambulatory Visit | Attending: Family Medicine | Admitting: Family Medicine

## 2023-01-01 ENCOUNTER — Ambulatory Visit
Admission: RE | Admit: 2023-01-01 | Discharge: 2023-01-01 | Disposition: A | Discharge status: Home / Self Care | Payer: Medicare PPO | Source: Home / Self Care | Attending: Family Medicine | Admitting: Family Medicine

## 2023-01-01 DIAGNOSIS — R0781 Pleurodynia: Secondary | ICD-10-CM | POA: Diagnosis not present

## 2023-01-01 DIAGNOSIS — E1129 Type 2 diabetes mellitus with other diabetic kidney complication: Secondary | ICD-10-CM | POA: Diagnosis not present

## 2023-01-01 DIAGNOSIS — Z1231 Encounter for screening mammogram for malignant neoplasm of breast: Secondary | ICD-10-CM | POA: Diagnosis not present

## 2023-01-03 ENCOUNTER — Other Ambulatory Visit: Payer: Self-pay | Admitting: Family Medicine

## 2023-01-03 DIAGNOSIS — R928 Other abnormal and inconclusive findings on diagnostic imaging of breast: Secondary | ICD-10-CM

## 2023-01-03 DIAGNOSIS — N6489 Other specified disorders of breast: Secondary | ICD-10-CM

## 2023-01-07 ENCOUNTER — Telehealth: Payer: Self-pay

## 2023-01-07 NOTE — Telephone Encounter (Signed)
Pt called for radiology results. Shared provider's note.  Valerie Roys, DO 01/07/2023  2:37 PM EDT     Please let her know that her x-ray came back normal. No fracture.   Pt states that she will be having a 2nd mammogram on her left breast tomorrow.

## 2023-01-08 ENCOUNTER — Ambulatory Visit
Admission: RE | Admit: 2023-01-08 | Discharge: 2023-01-08 | Disposition: A | Discharge status: Home / Self Care | Payer: Medicare PPO | Source: Ambulatory Visit | Attending: Family Medicine | Admitting: Family Medicine

## 2023-01-08 DIAGNOSIS — N6489 Other specified disorders of breast: Secondary | ICD-10-CM

## 2023-01-08 DIAGNOSIS — N6323 Unspecified lump in the left breast, lower outer quadrant: Secondary | ICD-10-CM | POA: Diagnosis not present

## 2023-01-08 DIAGNOSIS — R928 Other abnormal and inconclusive findings on diagnostic imaging of breast: Secondary | ICD-10-CM

## 2023-01-08 DIAGNOSIS — N6324 Unspecified lump in the left breast, lower inner quadrant: Secondary | ICD-10-CM | POA: Diagnosis not present

## 2023-01-09 ENCOUNTER — Other Ambulatory Visit: Payer: Self-pay | Admitting: Family Medicine

## 2023-01-09 NOTE — Telephone Encounter (Signed)
Requested Prescriptions  Pending Prescriptions Disp Refills   TOUJEO MAX SOLOSTAR 300 UNIT/ML Solostar Pen [Pharmacy Med Name: TOUJEO MAX SOLOSTAR 300 UNIT/ML SUB] 9 mL 3    Sig: INJECT 48 UNITS SUBCUTANEOUSLY AT BEDTIME     Endocrinology:  Diabetes - Insulins Passed - 01/09/2023 12:00 PM      Passed - HBA1C is between 0 and 7.9 and within 180 days    Hemoglobin A1C  Date Value Ref Range Status  06/28/2016 6.6  Final   HB A1C (BAYER DCA - WAIVED)  Date Value Ref Range Status  12/13/2022 7.9 (H) 4.8 - 5.6 % Final    Comment:             Prediabetes: 5.7 - 6.4          Diabetes: >6.4          Glycemic control for adults with diabetes: <7.0          Passed - Valid encounter within last 6 months    Recent Outpatient Visits           3 weeks ago Routine general medical examination at a health care facility   [facility], [place] P, DO   4 months ago Type 2 diabetes mellitus with diabetic nephropathy, with long-term current use of insulin ([place])   [place], [name], DO   7 months ago Type 2 diabetes mellitus with diabetic nephropathy, with long-term current use of insulin ([place])   [place], [name], DO   10 months ago Type 2 diabetes mellitus with diabetic nephropathy, with long-term current use of insulin ([place])   [place], [name], DO   10 months ago Type 2 diabetes mellitus with diabetic nephropathy, with long-term current use of insulin ([place])   [place], [place], DO       Future Appointments             In 1 month [place]oll[place], Kathlene November, MD [place] [facility] [place]   In 2 months Valerie Roys, DO [place], [place]

## 2023-01-14 DIAGNOSIS — M13841 Other specified arthritis, right hand: Secondary | ICD-10-CM | POA: Diagnosis not present

## 2023-01-21 ENCOUNTER — Ambulatory Visit: Payer: Self-pay | Admitting: *Deleted

## 2023-01-21 DIAGNOSIS — M13841 Other specified arthritis, right hand: Secondary | ICD-10-CM | POA: Diagnosis not present

## 2023-01-21 DIAGNOSIS — M79644 Pain in right finger(s): Secondary | ICD-10-CM | POA: Diagnosis not present

## 2023-01-21 NOTE — Patient Outreach (Signed)
  Care Coordination   Follow Up Visit Note   01/21/2023 Name: Karen Farley MRN: [id number] DOB: 1946-03-07  Karen Farley is a 77 y.o. year old female who sees Valerie Roys, DO for primary care. I spoke with  [name] by phone today.  What matters to the patients health and wellness today?  Continuing to manage diet appropriately for kidney and diabetic health.      Goals Addressed             This Visit's Progress    Effective management of DM   On track    Care Coordination Interventions: Provided education to patient about basic DM disease process Reviewed medications with patient and discussed importance of medication adherence Counseled on importance of regular laboratory monitoring as prescribed Reviewed scheduled/upcoming provider appointments including: ortho today, nephrology on 4/29, cardiology on 5/6, and PCP on 5/24 Advised patient, providing education and rationale, to check cbg 2-3 times a day and record, calling PCP for findings outside established parameters Reviewed recent blood sugars, range 65-150s.  Using Dexcom or monitoring, today was 102 Discussed A1C decrease from 8.3 down to 7.9, will have repeat labs next month Discussed recent increase in creatinine from 1.74 to 2.33, now down to 2.23.  Changed diet to help with kidney function         SDOH assessments and interventions completed:  No     Care Coordination Interventions:  Yes, provided   Interventions Today    Flowsheet Row Most Recent Value  Chronic Disease   Chronic disease during today's visit Diabetes, Chronic Kidney Disease/End Stage Renal Disease (ESRD)  General Interventions   General Interventions Discussed/Reviewed General Interventions Reviewed, Doctor Visits, Labs  Labs Hgb A1c every 3 months  Doctor Visits Discussed/Reviewed Doctor Visits Reviewed  Education Interventions   Education Provided Provided Education  Provided Verbal Education On Nutrition, When  to see the doctor, Blood Sugar Monitoring, Medication       Follow up plan: Follow up call scheduled for 5/28    Encounter Outcome:  Pt. Visit Completed   Valente David, RN, MSN, [place] Care Management Care Management Coordinator [phone]

## 2023-01-22 ENCOUNTER — Other Ambulatory Visit: Payer: Self-pay | Admitting: Nurse Practitioner

## 2023-01-22 NOTE — Telephone Encounter (Signed)
Requested medication (s) are due for refill today: yes  Requested medication (s) are on the active medication list: yes    Last refill: 09/04/22  #90 0 refills  Future visit scheduled yes 03/14/23  Notes to clinic:Not delegated, please review.  Requested Prescriptions  Pending Prescriptions Disp Refills   midodrine (PROAMATINE) 10 MG tablet [Pharmacy Med Name: MIDODRINE HCL 10 MG TAB] 90 tablet 0    Sig: TAKE 1 TABLET BY MOUTH 3 TIMES DAILY WITH MEALS     Not Delegated - Cardiovascular: Midodrine Failed - 01/22/2023  1:17 PM      Failed - This refill cannot be delegated      Failed - Cr in normal range and within 360 days    Creatinine, Ser  Date Value Ref Range Status  12/20/2022 2.23 (H) 0.57 - 1.00 mg/dL Final   Creatinine, Urine  Date Value Ref Range Status  04/11/2021 139 mg/dL Final         Passed - ALT in normal range and within 360 days    ALT  Date Value Ref Range Status  12/13/2022 20 0 - 32 IU/L Final         Passed - AST in normal range and within 360 days    AST  Date Value Ref Range Status  12/13/2022 20 0 - 40 IU/L Final         Passed - Last BP in normal range    BP Readings from Last 1 Encounters:  12/13/22 101/69         Passed - Valid encounter within last 12 months    Recent Outpatient Visits           1 month ago Routine general medical examination at a health care facility   [facility], [place] P, DO   4 months ago Type 2 diabetes mellitus with diabetic nephropathy, with long-term current use of insulin ([place])   [place], [name], DO   7 months ago Type 2 diabetes mellitus with diabetic nephropathy, with long-term current use of insulin ([place])   [place], [name], DO   10 months ago Type 2 diabetes mellitus with diabetic nephropathy, with long-term current use of insulin ([place])   [place], [name], DO   11  months ago Type 2 diabetes mellitus with diabetic nephropathy, with long-term current use of insulin ([place])   [place], [place], DO       Future Appointments             In 1 month Gollan, Kathlene November, MD [place] at [place]   In 1 month Valerie Roys, DO [place], [place]

## 2023-01-29 ENCOUNTER — Telehealth: Payer: Self-pay | Admitting: Family Medicine

## 2023-01-29 ENCOUNTER — Telehealth: Payer: Self-pay | Admitting: *Deleted

## 2023-01-29 NOTE — Telephone Encounter (Signed)
Pt is calling in requesting that [facility], a nurse give her a call back at her earliest convenience.

## 2023-01-29 NOTE — Patient Outreach (Signed)
  Care Coordination   Follow Up Visit Note   01/29/2023 Name: Karen Farley MRN: [id number] DOB: 1946/08/14  Karen Farley is a 77 y.o. year old female who sees Karen Carrow, DO for primary care. I spoke with  Karen Farley by phone today.  What matters to the patients health and wellness today?  Patient calling to report that her insurance company will no longer cover the prescribed pen needles and she will need new prescription sent to pharmacy.  State she has a little over one week left of old needles and wanting to refill prior to running out.  Will collaborate with PCP to order correct needles.     Goals Addressed             This Visit's Progress    Effective management of DM   On track    Care Coordination Interventions: Provided education to patient about basic DM disease process Reviewed medications with patient and discussed importance of medication adherence Counseled on importance of regular laboratory monitoring as prescribed Reviewed scheduled/upcoming provider appointments including: ortho today, nephrology on 4/29, cardiology on 5/6, and PCP on 5/24 Advised patient, providing education and rationale, to check cbg 2-3 times a day and record, calling PCP for findings outside established parameters Reviewed recent blood sugars, range 65-150s.  Using Dexcom or monitoring, today was 102 Discussed A1C decrease from 8.3 down to 7.9, will have repeat labs next month Discussed recent increase in creatinine from 1.74 to 2.33, now down to 2.23.  Changed diet to help with kidney function         SDOH assessments and interventions completed:  No     Care Coordination Interventions:  Yes, provided   Interventions Today    Flowsheet Row Most Recent Value  Chronic Disease   Chronic disease during today's visit Diabetes  General Interventions   General Interventions Discussed/Reviewed Communication with  Doctor Visits Discussed/Reviewed PCP, Doctor Visits  Reviewed  PCP/Specialist Visits Compliance with follow-up visit  Communication with PCP/Specialists        Follow up plan: Follow up call scheduled for 5/28    Encounter Outcome:  Pt. Visit Completed   Karen Durie, RN, MSN, [facility] Care Management Care Management Coordinator [phone]

## 2023-01-30 MED ORDER — PEN NEEDLES 32G X 4 MM MISC: 1.0000 | Freq: Three times a day (TID) | 12 refills | Status: DC

## 2023-02-07 DIAGNOSIS — H40053 Ocular hypertension, bilateral: Secondary | ICD-10-CM | POA: Diagnosis not present

## 2023-02-07 DIAGNOSIS — H43812 Vitreous degeneration, left eye: Secondary | ICD-10-CM | POA: Diagnosis not present

## 2023-02-07 DIAGNOSIS — E119 Type 2 diabetes mellitus without complications: Secondary | ICD-10-CM | POA: Diagnosis not present

## 2023-02-07 DIAGNOSIS — Z961 Presence of intraocular lens: Secondary | ICD-10-CM | POA: Diagnosis not present

## 2023-02-07 LAB — HM DIABETES EYE EXAM

## 2023-02-18 DIAGNOSIS — M79644 Pain in right finger(s): Secondary | ICD-10-CM | POA: Diagnosis not present

## 2023-02-22 ENCOUNTER — Other Ambulatory Visit: Payer: Self-pay | Admitting: Family Medicine

## 2023-02-22 NOTE — Progress Notes (Unsigned)
Cardiology Office Note  Date:  02/24/2023   ID:  Karen Farley, DOB 1946/07/08, MRN [id number]  PCP:  Dorcas Carrow, DO   Chief Complaint  Patient presents with   6 month follow up     "Doing well." Medications reviewed by the patient verbally.     HPI:  77 year old female with a history of  HFpEF,  hypertension,  stage III chronic kidney disease,  lymphedema,  permanent atrial fibrillation,  hyperlipidemia,  diabetes,  pericardial effusion status post pericardiocentesis,  pleural effusion status post thoracentesis, and  GI bleed,  who presents for follow-up of heart failure and hypokalemia.  Last seen in clinic by myself Nov 2023 Followed by CHF clinic last seen on August 2023  Followed by nephrology, Dr. Thedore Mins Followed by Dr. Olevia Perches Diabetic nurse at [facility]  Using lymphedema pumps 2 hrs a day Minimal leg edema Weight stable, 162 pounds, down from 188  Remains on torsemide 20 daily and metolazone 2.5 three times a week Sometimes 40 daily PRN for leg swelling Monitors her fluid intake, daily weight  Labs reviewed A1C 7.9 Total chol 232 LDL 157 LDL 176, up from 133 CR 2.2, stable  No Sob with walking Walking with a walker  EKG personally reviewed by myself on todays visit Atrial fibrillation rate 78 bpm no ST or T wave changes  Prior cardiac history reviewed Admitted to the hospital October 2022, large left pleural effusion requiring thoracentesis Rectal bleed, Eliquis discontinued, required transfusion Large pericardial effusion noted, no tamponade on echo Pericardiocentesis performed 700 Follow-up echo stable moderate effusion Required diuresis, transition to torsemide metolazone, Eliquis resumed  PMH:   has a past medical history of A-fib (HCC), CHF (congestive heart failure) (HCC), Chronic heart failure with preserved ejection fraction (HFpEF) (HCC), 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  PSH:    Past Surgical History:  Procedure Laterality Date   BASAL CELL CARCINOMA EXCISION  11/01/2022   CATARACT EXTRACTION W/PHACO Left 10/09/2021   Procedure: CATARACT EXTRACTION PHACO AND INTRAOCULAR LENS PLACEMENT (IOC) LEFT DIABETIC 4.84 00:37.8;  Surgeon: Galen Manila, MD;  Location: [facility];  Service: Ophthalmology;  Laterality: Left;  Diabetic   CATARACT EXTRACTION W/PHACO Right 10/30/2021   Procedure: CATARACT EXTRACTION PHACO AND INTRAOCULAR LENS PLACEMENT (IOC) RIGHT DIABETIC 5.65 00:38.5;  Surgeon: Galen Manila, MD;  Location: [facility];  Service: Ophthalmology;  Laterality: Right;  Diabetic   CHOLECYSTECTOMY     DILATION AND CURETTAGE OF UTERUS     IR FLUORO GUIDE CV LINE RIGHT  08/16/2021   IR THORACENTESIS ASP PLEURAL SPACE W/IMG GUIDE  08/16/2021   PERICARDIOCENTESIS N/A 08/05/2021   Procedure: PERICARDIOCENTESIS;  Surgeon: Marcina Millard, MD;  Location: [facility];  Service: Cardiovascular;  Laterality: N/A;   TEAR DUCT PROBING WITH STRABISMUS REPAIR Right    TONSILLECTOMY      Current Outpatient Medications  Medication Sig Dispense Refill   ACCU-CHEK GUIDE test strip USE TO CHECK BLOOD SUGAR DAILY     ELIQUIS 5 MG TABS tablet TAKE 1 TABLET BY MOUTH TWICE DAILY 180 tablet 3   empagliflozin (JARDIANCE) 25 MG TABS tablet TAKE 1 TABLET BY MOUTH ONCE DAILY BEFOREBREAKFAST 90 tablet 1   folic acid (FOLVITE) 1 MG  tablet Take 1 tablet (1 mg total) by mouth daily. 90 tablet 2   insulin aspart (NOVOLOG) 100 UNIT/ML injection Inject 4 Units into the skin 3 (three) times daily with meals. Increase 1 unit every 50 points above  150.     insulin glargine, 2 Unit Dial, (TOUJEO MAX SOLOSTAR) 300 UNIT/ML Solostar Pen INJECT 48 UNITS SUBCUTANEOUSLY AT BEDTIME 9 mL 0   latanoprost (XALATAN) 0.005 % ophthalmic solution Place 1 drop into both eyes at bedtime.     metolazone (ZAROXOLYN) 2.5 MG tablet TAKE 1 TABLET BY MOUTH EVERY MONDAY WEDNESDAY  AND FRIDAY 36 tablet 1   metoprolol succinate (TOPROL-XL) 25 MG 24 hr tablet Take 0.5 tablets (12.5 mg total) by mouth daily. 90 tablet 1   metroNIDAZOLE (METROGEL) 1 % gel Apply to face every morning.     midodrine (PROAMATINE) 10 MG tablet TAKE 1 TABLET BY MOUTH 3 TIMES DAILY WITH MEALS 90 tablet 0   Multiple Vitamin (MULTIVITAMIN WITH MINERALS) TABS tablet Take 1 tablet by mouth every other day.     pantoprazole (PROTONIX) 40 MG tablet TAKE 1 TABLET BY MOUTH TWICE DAILY 180 tablet 3   potassium chloride SA (KLOR-CON M) 20 MEQ tablet Take 2 tablets (40 mEq total) by mouth 2 (two) times daily. 360 tablet 1   rosuvastatin (CRESTOR) 5 MG tablet Take 1 tablet (5 mg total) by mouth once a week. 52 tablet 0   spironolactone (ALDACTONE) 50 MG tablet Take by mouth.     torsemide (DEMADEX) 20 MG tablet TAKE 1 TO 2 TABLETS BY MOUTH DAILY. ONLY TAKE 2 TABLETS FOR 1 TO 3 DAYS AND IF PERSISTS, CALL WITH BAD SWELLING 180 tablet 1   Insulin Pen Needle (PEN NEEDLES) 32G X 4 MM MISC 1 each by Does not apply route 3 (three) times daily. (Patient not taking: Reported on 02/24/2023) 1000 each 12   Insulin Pen Needle (ULTRACARE PEN NEEDLES) 32G X 4 MM MISC USE 1 PEN ONCE DAILY (Patient not taking: Reported on 02/24/2023) 100 each 0   Insulin Syringe-Needle U-100 (INSULIN SYRINGE 1CC/31GX5/16") 31G X 5/16" 1 ML MISC 1 each by Does not apply route 3 (three) times daily. (Patient not taking: Reported on 02/24/2023) 100 each 12   No current facility-administered medications for this visit.    Allergies:   Patient has no known allergies.   Social History:  The patient  reports that she has never smoked. She has  never used smokeless tobacco. She reports that she does not currently use alcohol. She reports that she does not use drugs.   Family History:   family history includes Breast cancer in her maternal grandmother and mother; Cancer in her brother and brother; Diabetes in her brother; Heart disease in her brother and mother; Heart disease (age of onset: 70) in her father; Osteoporosis in her mother; Parkinson's disease in her brother.   Review of Systems: Review of Systems  Constitutional: Negative.   HENT: Negative.    Respiratory: Negative.    Cardiovascular: Negative.   Gastrointestinal: Negative.   Musculoskeletal: Negative.   Neurological: Negative.   Psychiatric/Behavioral: Negative.    All other systems reviewed and are negative.   PHYSICAL EXAM: VS:  BP (!) 120/90 (BP Location: Left Arm, Patient Position: Sitting, Cuff Size: Normal)   Pulse 78   Ht 5' 1.5" (1.562 m)   Wt 162 lb 2 oz (73.5 kg)   SpO2 98%   BMI 30.14 kg/m  , BMI Body mass index is 30.14  kg/m. Constitutional:  oriented to person, place, and time. No distress.  HENT:  Head: Grossly normal Eyes:  no discharge. No scleral icterus.  Neck: No JVD, no carotid bruits  Cardiovascular: Regular rate and rhythm, no murmurs appreciated Pulmonary/Chest: Clear to auscultation bilaterally, no wheezes or rails Abdominal: Soft.  no distension.  no tenderness.  Musculoskeletal: Normal range of motion Neurological:  normal muscle tone. Coordination normal. No atrophy Skin: Skin warm and dry Psychiatric: normal affect, pleasant  Recent Labs: 12/13/2022: ALT 20; Hemoglobin 15.9; Platelets 361; TSH 2.730 12/20/2022: BUN 56; Creatinine, Ser 2.23; Potassium 4.1; Sodium 135    Lipid Panel Lab Results  Component Value Date   CHOL 232 (H) 12/13/2022   HDL 53 12/13/2022   LDLCALC 157 (H) 12/13/2022   TRIG 123 12/13/2022     Wt Readings from Last 3 Encounters:  02/24/23 162 lb 2 oz (73.5 kg)  12/13/22 162 lb 4.8 oz (73.6 kg)   09/04/22 162 lb (73.5 kg)     ASSESSMENT AND PLAN:  Problem List Items Addressed This Visit       Cardiology Problems   Hyperlipidemia   Pulmonary hypertension, primary (HCC)   Pericardial effusion   Atrial fibrillation (HCC)   PAD (peripheral artery disease) (HCC)     Other   Type 2 diabetes mellitus with renal complication (HCC)   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 diabetes mellitus (HCC)   Lymphedema   Other Visit Diagnoses     Chronic heart failure with preserved ejection fraction (HCC)    -  Primary   Essential hypertension         Chronic diastolic CHF  euvolemic, lungs clear,minimal leg edema continue torsemide 20 daily with metolazone 2.5 three  days a week Extra torsemide as needed for leg swelling  renal function stable, followed by nephrology  Chronic renal failure Managed by nephrology, creatinine running in the low 2 range,  Hypotension Continue midodrine 10 mg 3 times daily BP stable  Anemia Hemoglobin stable  Permanent atrial fibrillation On anticoagulation, rate controlled on low-dose metoprolol  Diabetes type 2 poorly controlled A1C improving Some low sugars  CHF education continued, daily weights, adjusting of diuretics  Total encounter time more than 40 minutes  Greater than 50% was spent in counseling and coordination of care with the patient    Signed, Dossie Arbour, M.D., Ph.D. [facility] [place], [place] [phone]

## 2023-02-24 ENCOUNTER — Encounter: Payer: Self-pay | Admitting: Cardiovascular Disease

## 2023-02-24 ENCOUNTER — Ambulatory Visit: Payer: Medicare PPO | Attending: Cardiovascular Disease | Admitting: Cardiovascular Disease

## 2023-02-24 VITALS — BP 120/90 | HR 78 | Ht 61.5 in | Wt 162.1 lb

## 2023-02-24 DIAGNOSIS — I890 Lymphedema, not elsewhere classified: Secondary | ICD-10-CM

## 2023-02-24 DIAGNOSIS — I5032 Chronic diastolic (congestive) heart failure: Secondary | ICD-10-CM

## 2023-02-24 DIAGNOSIS — E782 Mixed hyperlipidemia: Secondary | ICD-10-CM | POA: Diagnosis not present

## 2023-02-24 DIAGNOSIS — I4821 Permanent atrial fibrillation: Secondary | ICD-10-CM

## 2023-02-24 DIAGNOSIS — I3139 Other pericardial effusion (noninflammatory): Secondary | ICD-10-CM

## 2023-02-24 DIAGNOSIS — I270 Primary pulmonary hypertension: Secondary | ICD-10-CM | POA: Diagnosis not present

## 2023-02-24 DIAGNOSIS — N184 Chronic kidney disease, stage 4 (severe): Secondary | ICD-10-CM

## 2023-02-24 DIAGNOSIS — E1122 Type 2 diabetes mellitus with diabetic chronic kidney disease: Secondary | ICD-10-CM | POA: Diagnosis not present

## 2023-02-24 DIAGNOSIS — I739 Peripheral vascular disease, unspecified: Secondary | ICD-10-CM | POA: Diagnosis not present

## 2023-02-24 DIAGNOSIS — I10 Essential (primary) hypertension: Secondary | ICD-10-CM

## 2023-02-24 DIAGNOSIS — N183 Chronic kidney disease, stage 3 unspecified: Secondary | ICD-10-CM

## 2023-02-24 DIAGNOSIS — Z794 Long term (current) use of insulin: Secondary | ICD-10-CM

## 2023-02-24 MED ORDER — MIDODRINE HCL 10 MG PO TABS: ORAL_TABLET | ORAL | 3 refills | Status: DC

## 2023-02-24 NOTE — Telephone Encounter (Signed)
Requested medications are due for refill today.  yes  Requested medications are on the active medications list.  yes  Last refill. 01/27/2023 #90 0 rf  Future visit scheduled.   yes  Notes to clinic.  Refill not delegated.    Requested Prescriptions  Pending Prescriptions Disp Refills   midodrine (PROAMATINE) 10 MG tablet [Pharmacy Med Name: MIDODRINE HCL 10 MG TAB] 90 tablet 0    Sig: TAKE 1 TABLET BY MOUTH 3 TIMES DAILY WITH MEALS     Not Delegated - Cardiovascular: Midodrine Failed - 02/22/2023  1:57 PM      Failed - This refill cannot be delegated      Failed - Cr in normal range and within 360 days    Creatinine, Ser  Date Value Ref Range Status  12/20/2022 2.23 (H) 0.57 - 1.00 mg/dL Final   Creatinine, Urine  Date Value Ref Range Status  04/11/2021 139 mg/dL Final         Passed - ALT in normal range and within 360 days    ALT  Date Value Ref Range Status  12/13/2022 20 0 - 32 IU/L Final         Passed - AST in normal range and within 360 days    AST  Date Value Ref Range Status  12/13/2022 20 0 - 40 IU/L Final         Passed - Last BP in normal range    BP Readings from Last 1 Encounters:  12/13/22 101/69         Passed - Valid encounter within last 12 months    Recent Outpatient Visits           2 months ago Routine general medical examination at a health care facility   [facility], [place] P, DO   5 months ago Type 2 diabetes mellitus with diabetic nephropathy, with long-term current use of insulin (HCC)   [facility] [place], [name], DO   8 months ago Type 2 diabetes mellitus with diabetic nephropathy, with long-term current use of insulin (HCC)   [facility] [place], [name], DO   12 months ago Type 2 diabetes mellitus with diabetic nephropathy, with long-term current use of insulin (HCC)   [facility] [place], [name], DO   1 year ago Type 2  diabetes mellitus with diabetic nephropathy, with long-term current use of insulin (HCC)   [facility] [place], [place], DO       Future Appointments             Today Gollan, Tollie Pizza, MD [facility] at [place]   In 2 weeks Dorcas Carrow, DO  [facility]

## 2023-02-24 NOTE — Patient Instructions (Addendum)
Medication Instructions:  No changes  If you need a refill on your cardiac medications before your next appointment, please call your pharmacy.    Lab work: No new labs needed   Testing/Procedures: No new testing needed   Follow-Up: At [facility], you and your health needs are our priority.  As part of our continuing mission to provide you with exceptional heart care, we have created designated Provider Care Teams.  These Care Teams include your primary Cardiologist (physician) and Advanced Practice Providers (APPs -  Physician Assistants and Nurse Practitioners) who all work together to provide you with the care you need, when you need it.  You will need a follow up appointment in 6 months  Providers on your designated Care Team:   Christopher Berge, NP Ryan Dunn, PA-C Cadence Furth, PA-C  COVID-19 Vaccine Information can be found at: [email or web address] For questions related to vaccine distribution or appointments, please email [email or web address] or call [phone].   

## 2023-02-27 ENCOUNTER — Other Ambulatory Visit: Payer: Self-pay | Admitting: Cardiovascular Disease

## 2023-03-07 ENCOUNTER — Telehealth: Payer: Self-pay | Admitting: *Deleted

## 2023-03-07 NOTE — Patient Outreach (Signed)
  Care Coordination   Follow Up Visit Note   03/07/2023 Name: Karen Farley MRN: [id number] DOB: [birth year]  Karen Farley is a 77 y.o. year old female who sees Dorcas Carrow, DO for primary care. I spoke with  [name] by phone today.  What matters to the patients health and wellness today?  A1C decreasing, declines to have additional calls at this time as she is focusing on the health of husband, does not want to become overwhelmed with calls.     Goals Addressed             This Visit's Progress    COMPLETED: Effective management of DM   On track    Care Coordination Interventions: Provided education to patient about basic DM disease process Reviewed medications with patient and discussed importance of medication adherence Counseled on importance of regular laboratory monitoring as prescribed Reviewed scheduled/upcoming provider appointments including: ortho today, nephrology on 4/29, cardiology on 5/6, and PCP on 5/24 Advised patient, providing education and rationale, to check cbg 2-3 times a day and record, calling PCP for findings outside established parameters Reviewed recent blood sugars, range 65-150s.  Using Dexcom or monitoring, today was 102 Discussed A1C decrease from 8.3 down to 7.9, will have repeat labs next month Discussed recent increase in creatinine from 1.74 to 2.33, now down to 2.23.  Changed diet to help with kidney function         SDOH assessments and interventions completed:  No     Care Coordination Interventions:  Yes, provided   Interventions Today    Flowsheet Row Most Recent Value  Chronic Disease   Chronic disease during today's visit Diabetes, Chronic Kidney Disease/End Stage Renal Disease (ESRD)  General Interventions   General Interventions Discussed/Reviewed General Interventions Reviewed, Labs, Doctor Visits  Labs Hgb A1c every 3 months  Doctor Visits Discussed/Reviewed Doctor Visits Reviewed, PCP   PCP/Specialist Visits Compliance with follow-up visit  Education Interventions   Education Provided Provided Education  Provided Verbal Education On Blood Sugar Monitoring, When to see the doctor        Follow up plan: No further intervention required.   Encounter Outcome:  Pt. Visit Completed   Kemper Durie, RN, MSN, [facility] Care Management Care Management Coordinator [phone]

## 2023-03-14 ENCOUNTER — Encounter: Payer: Self-pay | Admitting: Family Medicine

## 2023-03-14 ENCOUNTER — Ambulatory Visit: Payer: Medicare PPO | Admitting: Family Medicine

## 2023-03-14 ENCOUNTER — Telehealth: Payer: Self-pay

## 2023-03-14 VITALS — BP 113/70 | HR 78 | Ht 61.5 in | Wt 161.8 lb

## 2023-03-14 DIAGNOSIS — Z794 Long term (current) use of insulin: Secondary | ICD-10-CM | POA: Diagnosis not present

## 2023-03-14 DIAGNOSIS — E1121 Type 2 diabetes mellitus with diabetic nephropathy: Secondary | ICD-10-CM | POA: Diagnosis not present

## 2023-03-14 LAB — BAYER DCA HB A1C WAIVED: HB A1C (BAYER DCA - WAIVED): 8.2 % — ABNORMAL HIGH (ref 4.8–5.6)

## 2023-03-14 MED ORDER — RYBELSUS 7 MG PO TABS: 7.0000 mg | ORAL_TABLET | Freq: Every day | ORAL | 2 refills | Status: DC

## 2023-03-14 MED ORDER — BD INSULIN SYRINGE U-500 31G X 6MM 0.5 ML MISC: 1.0000 | Freq: Three times a day (TID) | 4 refills | Status: DC | PRN

## 2023-03-14 MED ORDER — POTASSIUM CHLORIDE CRYS ER 20 MEQ PO TBCR: 40.0000 meq | EXTENDED_RELEASE_TABLET | Freq: Two times a day (BID) | ORAL | 0 refills | Status: DC

## 2023-03-14 MED ORDER — GVOKE HYPOPEN 2-PACK 0.5 MG/0.1ML ~~LOC~~ SOAJ: 1.0000 | Freq: Every day | SUBCUTANEOUS | 12 refills | Status: DC | PRN

## 2023-03-14 MED ORDER — RYBELSUS 3 MG PO TABS: 3.0000 mg | ORAL_TABLET | Freq: Every day | ORAL | 0 refills | Status: DC

## 2023-03-14 MED ORDER — TOUJEO MAX SOLOSTAR 300 UNIT/ML ~~LOC~~ SOPN: PEN_INJECTOR | SUBCUTANEOUS | 0 refills | Status: DC

## 2023-03-14 NOTE — Telephone Encounter (Signed)
The patient called to check on her metolazone refill. The patient was told a prescription was sent in on 02/27/23 with 2 refills.

## 2023-03-14 NOTE — Assessment & Plan Note (Signed)
Up slightly with A1c of 8.2 from 7.9. Will start her on rybelsus and cut her toujeo to 40 units. Recheck by phone in 3 weeks. Call with any concerns.

## 2023-03-14 NOTE — Progress Notes (Signed)
BP 113/70   Pulse 78   Ht 5' 1.5" (1.562 m)   Wt 161 lb 12.8 oz (73.4 kg)   SpO2 99%   BMI 30.08 kg/m    Subjective:    Patient ID: Karen Farley, female    DOB: 1946/03/01, 77 y.o.   MRN: [id number]  HPI: Karen Farley is a 77 y.o. female  Chief Complaint  Patient presents with   Diabetes    Patient says she noticed her readings have been low at night and high in during the day. Patient says it does not matter what she eats.    DIABETES- has been under a lot of stress. Her husband has been sick for about 3 months and recently broke his leg.  Hypoglycemic episodes:yes Polydipsia/polyuria: no Visual disturbance: no Chest pain: no Paresthesias: no Glucose Monitoring: yes  Accucheck frequency:  continuous monitor  Fasting glucose: 45-55, middle of the night  Evening: 300s Taking Insulin?: yes  Short acting: 4 units with meals if needed  Long acting: 48 units Blood Pressure Monitoring: not checking Retinal Examination: Up to Date Foot Exam: Up to Date Diabetic Education: Completed Pneumovax: Up to Date Influenza: Up to Date Aspirin: no  Relevant past medical, surgical, family and social history reviewed and updated as indicated. Interim medical history since our last visit reviewed. Allergies and medications reviewed and updated.  Review of Systems  Constitutional: Negative.   Respiratory: Negative.    Cardiovascular: Negative.   Gastrointestinal: Negative.   Musculoskeletal: Negative.   Psychiatric/Behavioral:  Negative for agitation, behavioral problems, confusion, decreased concentration, dysphoric mood, hallucinations, self-injury, sleep disturbance and suicidal ideas. The patient is nervous/anxious. The patient is not hyperactive.     Per HPI unless specifically indicated above     Objective:    BP 113/70   Pulse 78   Ht 5' 1.5" (1.562 m)   Wt 161 lb 12.8 oz (73.4 kg)   SpO2 99%   BMI 30.08 kg/m   Wt Readings from Last 3 Encounters:   03/14/23 161 lb 12.8 oz (73.4 kg)  02/24/23 162 lb 2 oz (73.5 kg)  12/13/22 162 lb 4.8 oz (73.6 kg)    Physical Exam Vitals and nursing note reviewed.  Constitutional:      General: She is not in acute distress.    Appearance: Normal appearance. She is obese. She is not ill-appearing, toxic-appearing or diaphoretic.  HENT:     Head: Normocephalic and atraumatic.     Right Ear: External ear normal.     Left Ear: External ear normal.     Nose: Nose normal.     Mouth/Throat:     Mouth: Mucous membranes are moist.     Pharynx: Oropharynx is clear.  Eyes:     General: No scleral icterus.       Right eye: No discharge.        Left eye: No discharge.     Extraocular Movements: Extraocular movements intact.     Conjunctiva/sclera: Conjunctivae normal.     Pupils: Pupils are equal, round, and reactive to light.  Cardiovascular:     Rate and Rhythm: Normal rate and regular rhythm.     Pulses: Normal pulses.     Heart sounds: Normal heart sounds. No murmur heard.    No friction rub. No gallop.  Pulmonary:     Effort: Pulmonary effort is normal. No respiratory distress.     Breath sounds: Normal breath sounds. No stridor. No wheezing, rhonchi or rales.  Chest:     Chest wall: No tenderness.  Musculoskeletal:        General: Normal range of motion.     Cervical back: Normal range of motion and neck supple.  Skin:    General: Skin is warm and dry.     Capillary Refill: Capillary refill takes less than 2 seconds.     Coloration: Skin is not jaundiced or pale.     Findings: No bruising, erythema, lesion or rash.  Neurological:     General: No focal deficit present.     Mental Status: She is alert and oriented to person, place, and time. Mental status is at baseline.  Psychiatric:        Mood and Affect: Mood normal.        Behavior: Behavior normal.        Thought Content: Thought content normal.        Judgment: Judgment normal.     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 Will start her on rybelsus and cut her toujeo to 40 units. Recheck by phone in 3 weeks. Call with any concerns.       Relevant Medications   Glucagon (GVOKE HYPOPEN 2-PACK) 0.5 MG/0.1ML SOAJ   insulin glargine, 2 Unit Dial, (TOUJEO MAX SOLOSTAR) 300 UNIT/ML Solostar Pen   Semaglutide (RYBELSUS) 3 MG TABS   Semaglutide (RYBELSUS) 7 MG TABS   Other Relevant Orders   Bayer DCA Hb A1c Waived     Follow up plan: Return in about 3 weeks (around 04/04/2023) for virtual OK.

## 2023-03-18 ENCOUNTER — Encounter: Payer: Self-pay | Admitting: *Deleted

## 2023-03-22 ENCOUNTER — Other Ambulatory Visit: Payer: Self-pay | Admitting: Family Medicine

## 2023-03-24 NOTE — Telephone Encounter (Signed)
Last RF 06/26/22 #180 3 RF  Requested Prescriptions  Refused Prescriptions Disp Refills   ELIQUIS 5 MG TABS tablet [Pharmacy Med Name: ELIQUIS 5 MG TAB] 180 tablet 3    Sig: TAKE 1 TABLET BY MOUTH TWICE DAILY     Hematology:  Anticoagulants - apixaban Failed - 03/22/2023 11:37 AM      Failed - HCT in normal range and within 360 days    Hematocrit  Date Value Ref Range Status  12/13/2022 51.1 (H) 34.0 - 46.6 % Final         Failed - Cr in normal range and within 360 days    Creatinine, Ser  Date Value Ref Range Status  12/20/2022 2.23 (H) 0.57 - 1.00 mg/dL Final   Creatinine, Urine  Date Value Ref Range Status  04/11/2021 139 mg/dL Final         Passed - PLT in normal range and within 360 days    Platelets  Date Value Ref Range Status  12/13/2022 361 150 - 450 x10E3/uL Final         Passed - HGB in normal range and within 360 days    Hemoglobin  Date Value Ref Range Status  12/13/2022 15.9 11.1 - 15.9 g/dL Final         Passed - AST in normal range and within 360 days    AST  Date Value Ref Range Status  12/13/2022 20 0 - 40 IU/L Final         Passed - ALT in normal range and within 360 days    ALT  Date Value Ref Range Status  12/13/2022 20 0 - 32 IU/L Final         Passed - Valid encounter within last 12 months    Recent Outpatient Visits           1 week ago Type 2 diabetes mellitus with diabetic nephropathy, with long-term current use of insulin (HCC)   [place] [place], [name], DO   3 months ago Routine general medical examination at a health care facility   [facility], [place] P, DO   6 months ago Type 2 diabetes mellitus with diabetic nephropathy, with long-term current use of insulin (HCC)   [place] [place], [name], DO   9 months ago Type 2 diabetes mellitus with diabetic nephropathy, with long-term current use of insulin (HCC)   [facility] [place], [name], DO   1 year ago Type 2 diabetes mellitus with diabetic nephropathy, with long-term current use of insulin (HCC)   [place] [facility] [place], Oralia Rud, DO       Future Appointments             In 1 week Laural Benes, Oralia Rud, DO [facility]

## 2023-04-01 DIAGNOSIS — E1129 Type 2 diabetes mellitus with other diabetic kidney complication: Secondary | ICD-10-CM | POA: Diagnosis not present

## 2023-04-04 ENCOUNTER — Encounter: Payer: Self-pay | Admitting: Family Medicine

## 2023-04-04 ENCOUNTER — Ambulatory Visit: Payer: Medicare PPO | Admitting: Family Medicine

## 2023-04-04 VITALS — BP 101/68 | HR 75 | Temp 98.6°F | Ht 61.5 in | Wt 158.4 lb

## 2023-04-04 DIAGNOSIS — Z794 Long term (current) use of insulin: Secondary | ICD-10-CM | POA: Diagnosis not present

## 2023-04-04 DIAGNOSIS — E1121 Type 2 diabetes mellitus with diabetic nephropathy: Secondary | ICD-10-CM

## 2023-04-04 MED ORDER — RYBELSUS 7 MG PO TABS: 7.0000 mg | ORAL_TABLET | Freq: Every day | ORAL | 0 refills | Status: DC

## 2023-04-04 NOTE — Patient Instructions (Signed)
Finish the 3mg  rybelsus you have at home Start the 7mg  rybelsus from the pharmacy. When you start the 7mg  rybelsus, cut your toujeo to 30units

## 2023-04-04 NOTE — Assessment & Plan Note (Signed)
Tolerating her rybelsus well. Finish the month of the 3mg  rybelsus and then start her 7mg . Cut her toujeo to 30 units when she starts her 7mg  rybelsus. Will only take her novolog when needed. Continue to monitor. Recheck 6-8 weeks. A1c due in August.

## 2023-04-04 NOTE — Progress Notes (Signed)
BP 101/68   Pulse 75   Temp 98.6 F (37 C) (Oral)   Ht 5' 1.5" (1.562 m)   Wt 158 lb 6.4 oz (71.8 kg)   SpO2 95%   BMI 29.44 kg/m    Subjective:    Patient ID: Karen Farley, female    DOB: [birth year], 77 y.o.   MRN: [id number]  HPI: Karen Farley is a 77 y.o. female  Chief Complaint  Patient presents with   Diabetes   DIABETES- tolerating the rybelsus well. No side effects Hypoglycemic episodes:yes- 1x, sugar wasn't low, but felt really low Polydipsia/polyuria: no Visual disturbance: no Chest pain: no Paresthesias: no Glucose Monitoring: yes  Accucheck frequency: continuous  Fasting glucose: 94, usually 115-130 Taking Insulin?: yes  Long acting insulin: 40 units at bedtime  Short acting insulin: sliding scale- has not needed it very often Blood Pressure Monitoring: not checking Retinal Examination: Not up to Date Foot Exam: Up to Date Diabetic Education: Completed Pneumovax: Up to Date Influenza: Up to Date Aspirin: no  Relevant past medical, surgical, family and social history reviewed and updated as indicated. Interim medical history since our last visit reviewed. Allergies and medications reviewed and updated.  Review of Systems  Constitutional: Negative.   Respiratory: Negative.    Cardiovascular: Negative.   Gastrointestinal: Negative.   Musculoskeletal: Negative.   Psychiatric/Behavioral: Negative.      Per HPI unless specifically indicated above     Objective:    BP 101/68   Pulse 75   Temp 98.6 F (37 C) (Oral)   Ht 5' 1.5" (1.562 m)   Wt 158 lb 6.4 oz (71.8 kg)   SpO2 95%   BMI 29.44 kg/m   Wt Readings from Last 3 Encounters:  04/04/23 158 lb 6.4 oz (71.8 kg)  03/14/23 161 lb 12.8 oz (73.4 kg)  02/24/23 162 lb 2 oz (73.5 kg)    Physical Exam Vitals and nursing note reviewed.  Constitutional:      General: She is not in acute distress.    Appearance: Normal appearance. She is normal weight. She is not ill-appearing,  toxic-appearing or diaphoretic.  HENT:     Head: Normocephalic and atraumatic.     Right Ear: External ear normal.     Left Ear: External ear normal.     Nose: Nose normal.     Mouth/Throat:     Mouth: Mucous membranes are moist.     Pharynx: Oropharynx is clear.  Eyes:     General: No scleral icterus.       Right eye: No discharge.        Left eye: No discharge.     Extraocular Movements: Extraocular movements intact.     Conjunctiva/sclera: Conjunctivae normal.     Pupils: Pupils are equal, round, and reactive to light.  Cardiovascular:     Rate and Rhythm: Normal rate and regular rhythm.     Pulses: Normal pulses.     Heart sounds: Normal heart sounds. No murmur heard.    No friction rub. No gallop.  Pulmonary:     Effort: Pulmonary effort is normal. No respiratory distress.     Breath sounds: Normal breath sounds. No stridor. No wheezing, rhonchi or rales.  Chest:     Chest wall: No tenderness.  Musculoskeletal:        General: Normal range of motion.     Cervical back: Normal range of motion and neck supple.  Skin:    General: Skin is  warm and dry.     Capillary Refill: Capillary refill takes less than 2 seconds.     Coloration: Skin is not jaundiced or pale.     Findings: No bruising, erythema, lesion or rash.  Neurological:     General: No focal deficit present.     Mental Status: She is alert and oriented to person, place, and time. Mental status is at baseline.  Psychiatric:        Mood and Affect: Mood normal.        Behavior: Behavior normal.        Thought Content: Thought content normal.        Judgment: Judgment normal.     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 Finish the month of the 3mg  rybelsus and then start her 7mg . Cut her toujeo to 30 units when she starts her 7mg  rybelsus. Will only take her novolog when needed. Continue to monitor. Recheck 6-8 weeks. A1c due in August.       Relevant Medications   Semaglutide (RYBELSUS) 7 MG TABS     Follow up plan: Return 6-8 weeks.

## 2023-04-07 ENCOUNTER — Telehealth: Payer: Self-pay

## 2023-04-07 NOTE — Telephone Encounter (Signed)
-----   Message from Dorcas Carrow, DO sent at 04/04/2023  2:20 PM EDT ----- Eye exam from [facility] please, exam done 02/07/23

## 2023-04-07 NOTE — Telephone Encounter (Signed)
Patient most recent Diabetic Eye Exam requested per Dr Laural Benes.

## 2023-04-07 NOTE — Telephone Encounter (Signed)
-----   Message from [name] Johnson, DO sent at 04/04/2023  2:20 PM EDT ----- Eye exam from [place] [facility] please, exam done 02/07/23  

## 2023-04-16 DIAGNOSIS — R600 Localized edema: Secondary | ICD-10-CM | POA: Diagnosis not present

## 2023-04-16 DIAGNOSIS — N184 Chronic kidney disease, stage 4 (severe): Secondary | ICD-10-CM | POA: Diagnosis not present

## 2023-04-16 DIAGNOSIS — E1122 Type 2 diabetes mellitus with diabetic chronic kidney disease: Secondary | ICD-10-CM | POA: Diagnosis not present

## 2023-04-16 DIAGNOSIS — I890 Lymphedema, not elsewhere classified: Secondary | ICD-10-CM | POA: Diagnosis not present

## 2023-04-16 DIAGNOSIS — E876 Hypokalemia: Secondary | ICD-10-CM | POA: Diagnosis not present

## 2023-04-21 DIAGNOSIS — R600 Localized edema: Secondary | ICD-10-CM | POA: Diagnosis not present

## 2023-04-21 DIAGNOSIS — I890 Lymphedema, not elsewhere classified: Secondary | ICD-10-CM | POA: Diagnosis not present

## 2023-04-21 DIAGNOSIS — E876 Hypokalemia: Secondary | ICD-10-CM | POA: Diagnosis not present

## 2023-04-21 DIAGNOSIS — N179 Acute kidney failure, unspecified: Secondary | ICD-10-CM | POA: Diagnosis not present

## 2023-04-21 DIAGNOSIS — E1122 Type 2 diabetes mellitus with diabetic chronic kidney disease: Secondary | ICD-10-CM | POA: Diagnosis not present

## 2023-04-21 DIAGNOSIS — N184 Chronic kidney disease, stage 4 (severe): Secondary | ICD-10-CM | POA: Diagnosis not present

## 2023-04-23 DIAGNOSIS — D2261 Melanocytic nevi of right upper limb, including shoulder: Secondary | ICD-10-CM | POA: Diagnosis not present

## 2023-04-23 DIAGNOSIS — Z85828 Personal history of other malignant neoplasm of skin: Secondary | ICD-10-CM | POA: Diagnosis not present

## 2023-04-23 DIAGNOSIS — D485 Neoplasm of uncertain behavior of skin: Secondary | ICD-10-CM | POA: Diagnosis not present

## 2023-04-23 DIAGNOSIS — D2271 Melanocytic nevi of right lower limb, including hip: Secondary | ICD-10-CM | POA: Diagnosis not present

## 2023-04-23 DIAGNOSIS — D2262 Melanocytic nevi of left upper limb, including shoulder: Secondary | ICD-10-CM | POA: Diagnosis not present

## 2023-04-23 DIAGNOSIS — Z872 Personal history of diseases of the skin and subcutaneous tissue: Secondary | ICD-10-CM | POA: Diagnosis not present

## 2023-04-23 DIAGNOSIS — D2272 Melanocytic nevi of left lower limb, including hip: Secondary | ICD-10-CM | POA: Diagnosis not present

## 2023-04-28 DIAGNOSIS — N179 Acute kidney failure, unspecified: Secondary | ICD-10-CM | POA: Diagnosis not present

## 2023-04-28 DIAGNOSIS — N184 Chronic kidney disease, stage 4 (severe): Secondary | ICD-10-CM | POA: Diagnosis not present

## 2023-04-28 DIAGNOSIS — E876 Hypokalemia: Secondary | ICD-10-CM | POA: Diagnosis not present

## 2023-04-28 DIAGNOSIS — R600 Localized edema: Secondary | ICD-10-CM | POA: Diagnosis not present

## 2023-04-28 DIAGNOSIS — E1122 Type 2 diabetes mellitus with diabetic chronic kidney disease: Secondary | ICD-10-CM | POA: Diagnosis not present

## 2023-04-28 DIAGNOSIS — I890 Lymphedema, not elsewhere classified: Secondary | ICD-10-CM | POA: Diagnosis not present

## 2023-05-27 ENCOUNTER — Other Ambulatory Visit: Payer: Self-pay | Admitting: Family Medicine

## 2023-05-28 NOTE — Telephone Encounter (Signed)
Requested Prescriptions  Pending Prescriptions Disp Refills   rosuvastatin (CRESTOR) 5 MG tablet [Pharmacy Med Name: ROSUVASTATIN CALCIUM 5 MG TAB] 12 tablet 0    Sig: TAKE 1 TABLET BY MOUTH ONCE A WEEK     Cardiovascular:  Antilipid - Statins 2 Failed - 05/27/2023  1:20 PM      Failed - Cr in normal range and within 360 days    Creatinine, Ser  Date Value Ref Range Status  12/20/2022 2.23 (H) 0.57 - 1.00 mg/dL Final   Creatinine, Urine  Date Value Ref Range Status  04/11/2021 139 mg/dL Final         Failed - Lipid Panel in normal range within the last 12 months    Cholesterol, Total  Date Value Ref Range Status  12/13/2022 232 (H) 100 - 199 mg/dL Final   Cholesterol Piccolo, Waived  Date Value Ref Range Status  06/28/2016 211 (H) <200 mg/dL Final    Comment:                            Desirable                <200                         Borderline High      200- 239                         High                     >239    LDL Chol Calc (NIH)  Date Value Ref Range Status  12/13/2022 157 (H) 0 - 99 mg/dL Final   HDL  Date Value Ref Range Status  12/13/2022 53 >39 mg/dL Final   Triglycerides  Date Value Ref Range Status  12/13/2022 123 0 - 149 mg/dL Final   Triglycerides Piccolo,Waived  Date Value Ref Range Status  06/28/2016 204 (H) <150 mg/dL Final    Comment:                            Normal                   <150                         Borderline High     150 - 199                         High                200 - 499                         Very High                >499          Passed - Patient is not pregnant      Passed - Valid encounter within last 12 months    Recent Outpatient Visits           1 month ago Type 2 diabetes mellitus with diabetic nephropathy, with long-term current use of insulin (HCC)   [place] [facility] [place], [place], DO  2 months ago Type 2 diabetes mellitus with diabetic nephropathy, with long-term  current use of insulin (HCC)   [place] [facility] [place], [name], DO   5 months ago Routine general medical examination at a health care facility   [facility] [place] P, DO   8 months ago Type 2 diabetes mellitus with diabetic nephropathy, with long-term current use of insulin (HCC)   [place] [facility], [name], DO   11 months ago Type 2 diabetes mellitus with diabetic nephropathy, with long-term current use of insulin (HCC)   [place] [facility] [place], Oralia Rud, DO       Future Appointments             In 2 days Laural Benes, Oralia Rud, DO [facility]   In 3 months Gollan, Tollie Pizza, MD [facility]

## 2023-05-30 ENCOUNTER — Ambulatory Visit: Payer: Medicare PPO | Admitting: Family Medicine

## 2023-05-30 ENCOUNTER — Encounter: Payer: Self-pay | Admitting: Family Medicine

## 2023-05-30 VITALS — BP 135/73 | HR 82 | Temp 97.6°F | Wt 152.0 lb

## 2023-05-30 DIAGNOSIS — E1121 Type 2 diabetes mellitus with diabetic nephropathy: Secondary | ICD-10-CM

## 2023-05-30 DIAGNOSIS — Z794 Long term (current) use of insulin: Secondary | ICD-10-CM | POA: Diagnosis not present

## 2023-05-30 LAB — BAYER DCA HB A1C WAIVED: HB A1C (BAYER DCA - WAIVED): 7.4 % — ABNORMAL HIGH (ref 4.8–5.6)

## 2023-05-30 MED ORDER — TOUJEO MAX SOLOSTAR 300 UNIT/ML ~~LOC~~ SOPN: PEN_INJECTOR | SUBCUTANEOUS | 0 refills | Status: DC

## 2023-05-30 MED ORDER — POTASSIUM CHLORIDE CRYS ER 20 MEQ PO TBCR: 40.0000 meq | EXTENDED_RELEASE_TABLET | Freq: Two times a day (BID) | ORAL | 0 refills | Status: DC

## 2023-05-30 MED ORDER — EMPAGLIFLOZIN 25 MG PO TABS: ORAL_TABLET | ORAL | 1 refills | Status: DC

## 2023-05-30 MED ORDER — METOPROLOL SUCCINATE ER 25 MG PO TB24: 12.5000 mg | ORAL_TABLET | Freq: Every day | ORAL | 1 refills | Status: DC

## 2023-05-30 MED ORDER — APIXABAN 5 MG PO TABS: 5.0000 mg | ORAL_TABLET | Freq: Two times a day (BID) | ORAL | 3 refills | Status: DC

## 2023-05-30 MED ORDER — ROSUVASTATIN CALCIUM 5 MG PO TABS: 5.0000 mg | ORAL_TABLET | ORAL | 0 refills | Status: DC

## 2023-05-30 MED ORDER — RYBELSUS 7 MG PO TABS: 7.0000 mg | ORAL_TABLET | Freq: Every day | ORAL | 1 refills | Status: DC

## 2023-05-30 MED ORDER — TORSEMIDE 20 MG PO TABS: ORAL_TABLET | ORAL | 1 refills | Status: DC

## 2023-05-30 NOTE — Assessment & Plan Note (Signed)
Improved with A1c of 7.4 down from 8.2. Continue current regimen, will cut her toujeo to 25-28 units at bedtime to avoid hypoglycemia. Continue to monitor. Recheck 3 months.

## 2023-05-30 NOTE — Progress Notes (Signed)
BP 135/73   Pulse 82   Temp 97.6 F (36.4 C) (Oral)   Wt 152 lb (68.9 kg)   SpO2 96%   BMI 28.26 kg/m    Subjective:    Patient ID: Karen Farley, female    DOB: [birth year], 77 y.o.   MRN: [id number]  HPI: Karen Farley is a 77 y.o. female  Chief Complaint  Patient presents with   Diabetes   DIABETES Hypoglycemic episodes:yes Polydipsia/polyuria: no Visual disturbance: no Chest pain: no Paresthesias: no Glucose Monitoring: yes  Accucheck frequency: continuous  Fasting glucose: 100-130 Taking Insulin?: yes  Long acting insulin: occasionally has skipped 28-30  Short acting insulin: None- has not had to use it Blood Pressure Monitoring: not checking Retinal Examination: Up to Date Foot Exam: Up to Date Diabetic Education: Completed Pneumovax: Up to Date Influenza: Not up to Date Aspirin: no  Relevant past medical, surgical, family and social history reviewed and updated as indicated. Interim medical history since our last visit reviewed. Allergies and medications reviewed and updated.  Review of Systems  Constitutional: Negative.   Respiratory: Negative.    Cardiovascular: Negative.   Gastrointestinal: Negative.   Musculoskeletal: Negative.   Neurological: Negative.   Psychiatric/Behavioral: Negative.      Per HPI unless specifically indicated above     Objective:    BP 135/73   Pulse 82   Temp 97.6 F (36.4 C) (Oral)   Wt 152 lb (68.9 kg)   SpO2 96%   BMI 28.26 kg/m   Wt Readings from Last 3 Encounters:  05/30/23 152 lb (68.9 kg)  04/04/23 158 lb 6.4 oz (71.8 kg)  03/14/23 161 lb 12.8 oz (73.4 kg)    Physical Exam Vitals and nursing note reviewed.  Constitutional:      General: She is not in acute distress.    Appearance: Normal appearance. She is not ill-appearing, toxic-appearing or diaphoretic.  HENT:     Head: Normocephalic and atraumatic.     Right Ear: External ear normal.     Left Ear: External ear normal.     Nose: Nose  normal.     Mouth/Throat:     Mouth: Mucous membranes are moist.     Pharynx: Oropharynx is clear.  Eyes:     General: No scleral icterus.       Right eye: No discharge.        Left eye: No discharge.     Extraocular Movements: Extraocular movements intact.     Conjunctiva/sclera: Conjunctivae normal.     Pupils: Pupils are equal, round, and reactive to light.  Cardiovascular:     Rate and Rhythm: Normal rate and regular rhythm.     Pulses: Normal pulses.     Heart sounds: Normal heart sounds. No murmur heard.    No friction rub. No gallop.  Pulmonary:     Effort: Pulmonary effort is normal. No respiratory distress.     Breath sounds: Normal breath sounds. No stridor. No wheezing, rhonchi or rales.  Chest:     Chest wall: No tenderness.  Musculoskeletal:        General: Normal range of motion.     Cervical back: Normal range of motion and neck supple.  Skin:    General: Skin is warm and dry.     Capillary Refill: Capillary refill takes less than 2 seconds.     Coloration: Skin is not jaundiced or pale.     Findings: No bruising, erythema, lesion or rash.  Neurological:     General: No focal deficit present.     Mental Status: She is alert and oriented to person, place, and time. Mental status is at baseline.  Psychiatric:        Mood and Affect: Mood normal.        Behavior: Behavior normal.        Thought Content: Thought content normal.        Judgment: Judgment normal.     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 Continue current regimen, will cut her toujeo to 25-28 units at bedtime to avoid hypoglycemia. Continue to monitor. Recheck 3 months.       Relevant Medications   insulin  glargine, 2 Unit Dial, (TOUJEO MAX SOLOSTAR) 300 UNIT/ML Solostar Pen   empagliflozin (JARDIANCE) 25 MG TABS tablet   rosuvastatin (CRESTOR) 5 MG tablet   Semaglutide (RYBELSUS) 7 MG TABS   Other Relevant Orders   Bayer DCA Hb A1c Waived     Follow up plan: Return in about 3 months (around 08/30/2023).

## 2023-06-03 ENCOUNTER — Other Ambulatory Visit: Payer: Self-pay | Admitting: Family Medicine

## 2023-06-03 DIAGNOSIS — N63 Unspecified lump in unspecified breast: Secondary | ICD-10-CM

## 2023-06-24 DIAGNOSIS — E876 Hypokalemia: Secondary | ICD-10-CM | POA: Diagnosis not present

## 2023-06-24 DIAGNOSIS — N179 Acute kidney failure, unspecified: Secondary | ICD-10-CM | POA: Diagnosis not present

## 2023-06-24 DIAGNOSIS — I890 Lymphedema, not elsewhere classified: Secondary | ICD-10-CM | POA: Diagnosis not present

## 2023-06-24 DIAGNOSIS — N184 Chronic kidney disease, stage 4 (severe): Secondary | ICD-10-CM | POA: Diagnosis not present

## 2023-06-24 DIAGNOSIS — E1122 Type 2 diabetes mellitus with diabetic chronic kidney disease: Secondary | ICD-10-CM | POA: Diagnosis not present

## 2023-06-24 DIAGNOSIS — R600 Localized edema: Secondary | ICD-10-CM | POA: Diagnosis not present

## 2023-06-30 DIAGNOSIS — E1122 Type 2 diabetes mellitus with diabetic chronic kidney disease: Secondary | ICD-10-CM | POA: Diagnosis not present

## 2023-06-30 DIAGNOSIS — N184 Chronic kidney disease, stage 4 (severe): Secondary | ICD-10-CM | POA: Diagnosis not present

## 2023-06-30 DIAGNOSIS — R600 Localized edema: Secondary | ICD-10-CM | POA: Diagnosis not present

## 2023-06-30 DIAGNOSIS — E876 Hypokalemia: Secondary | ICD-10-CM | POA: Diagnosis not present

## 2023-06-30 DIAGNOSIS — E790 Hyperuricemia without signs of inflammatory arthritis and tophaceous disease: Secondary | ICD-10-CM | POA: Diagnosis not present

## 2023-06-30 DIAGNOSIS — N179 Acute kidney failure, unspecified: Secondary | ICD-10-CM | POA: Diagnosis not present

## 2023-06-30 DIAGNOSIS — I890 Lymphedema, not elsewhere classified: Secondary | ICD-10-CM | POA: Diagnosis not present

## 2023-06-30 DIAGNOSIS — E1129 Type 2 diabetes mellitus with other diabetic kidney complication: Secondary | ICD-10-CM | POA: Diagnosis not present

## 2023-07-11 ENCOUNTER — Other Ambulatory Visit: Payer: Self-pay | Admitting: Family Medicine

## 2023-07-14 NOTE — Telephone Encounter (Signed)
Requested Prescriptions  Pending Prescriptions Disp Refills   folic acid (FOLVITE) 1 MG tablet [Pharmacy Med Name: FOLIC ACID 1 MG TAB] 90 tablet 0    Sig: TAKE 1 TABLET BY MOUTH ONCE DAILY     Endocrinology:  Vitamins Passed - 07/11/2023 12:32 PM      Passed - Valid encounter within last 12 months    Recent Outpatient Visits           1 month ago Type 2 diabetes mellitus with diabetic nephropathy, with long-term current use of insulin (HCC)   [place] [facility] [place], [name], DO   3 months ago Type 2 diabetes mellitus with diabetic nephropathy, with long-term current use of insulin (HCC)   [place] [facility] [place], [name], DO   4 months ago Type 2 diabetes mellitus with diabetic nephropathy, with long-term current use of insulin (HCC)   [place] [facility] [place], [name], DO   7 months ago Routine general medical examination at a health care facility   [facility] [place] P, DO   10 months ago Type 2 diabetes mellitus with diabetic nephropathy, with long-term current use of insulin (HCC)   [place] [facility] [place], [place], DO       Future Appointments             In 1 month Gollan, Tollie Pizza, MD [facility] [place]   In 1 month Dorcas Carrow, DO [facility] [facility], [facility]

## 2023-07-16 ENCOUNTER — Ambulatory Visit
Admission: RE | Admit: 2023-07-16 | Discharge: 2023-07-16 | Disposition: A | Discharge status: Home / Self Care | Payer: Medicare PPO | Source: Ambulatory Visit | Attending: Family Medicine | Admitting: Family Medicine

## 2023-07-16 ENCOUNTER — Ambulatory Visit
Admission: RE | Admit: 2023-07-16 | Discharge: 2023-07-16 | Disposition: A | Discharge status: Home / Self Care | Payer: Medicare PPO | Source: Ambulatory Visit | Attending: Family Medicine

## 2023-07-16 DIAGNOSIS — N63 Unspecified lump in unspecified breast: Secondary | ICD-10-CM

## 2023-07-16 DIAGNOSIS — R92322 Mammographic fibroglandular density, left breast: Secondary | ICD-10-CM | POA: Diagnosis not present

## 2023-07-16 DIAGNOSIS — N6325 Unspecified lump in the left breast, overlapping quadrants: Secondary | ICD-10-CM | POA: Insufficient documentation

## 2023-08-20 ENCOUNTER — Other Ambulatory Visit: Payer: Self-pay | Admitting: Family Medicine

## 2023-08-20 NOTE — Telephone Encounter (Signed)
Requested Prescriptions  Pending Prescriptions Disp Refills   pantoprazole (PROTONIX) 40 MG tablet [Pharmacy Med Name: PANTOPRAZOLE SODIUM 40 MG DR TAB] 180 tablet 3    Sig: TAKE 1 TABLET BY MOUTH TWICE DAILY     Gastroenterology: Proton Pump Inhibitors Passed - 08/20/2023 10:33 AM      Passed - Valid encounter within last 12 months    Recent Outpatient Visits           2 months ago Type 2 diabetes mellitus with diabetic nephropathy, with long-term current use of insulin (HCC)   [place] [facility] [place], [name], DO   4 months ago Type 2 diabetes mellitus with diabetic nephropathy, with long-term current use of insulin (HCC)   [place] [facility] [place], [name], DO   5 months ago Type 2 diabetes mellitus with diabetic nephropathy, with long-term current use of insulin (HCC)   [place] [facility] [place], [name], DO   8 months ago Routine general medical examination at a health care facility   [facility], [name], DO   11 months ago Type 2 diabetes mellitus with diabetic nephropathy, with long-term current use of insulin (HCC)   [place] [facility] [place], [place], DO       Future Appointments             In 1 week Gollan, Tollie Pizza, MD [facility] [place]   In 1 week Dorcas Carrow, DO [place] [facility], [facility]

## 2023-08-26 DIAGNOSIS — N184 Chronic kidney disease, stage 4 (severe): Secondary | ICD-10-CM | POA: Diagnosis not present

## 2023-08-26 DIAGNOSIS — E876 Hypokalemia: Secondary | ICD-10-CM | POA: Diagnosis not present

## 2023-08-26 DIAGNOSIS — R600 Localized edema: Secondary | ICD-10-CM | POA: Diagnosis not present

## 2023-08-26 DIAGNOSIS — E790 Hyperuricemia without signs of inflammatory arthritis and tophaceous disease: Secondary | ICD-10-CM | POA: Diagnosis not present

## 2023-08-26 DIAGNOSIS — I890 Lymphedema, not elsewhere classified: Secondary | ICD-10-CM | POA: Diagnosis not present

## 2023-08-26 DIAGNOSIS — E1122 Type 2 diabetes mellitus with diabetic chronic kidney disease: Secondary | ICD-10-CM | POA: Diagnosis not present

## 2023-08-31 NOTE — Progress Notes (Deleted)
Cardiology Office Note  Date:  08/31/2023   ID:  Karen Farley, DOB 07-Mar-1946, MRN [id number]  PCP:  Dorcas Carrow, DO   No chief complaint on file.   HPI:  77 year old female with a history of  HFpEF,  hypertension,  stage III chronic kidney disease,  lymphedema,  permanent atrial fibrillation,  hyperlipidemia,  diabetes,  pericardial effusion status post pericardiocentesis,  pleural effusion status post thoracentesis, and  GI bleed,  who presents for follow-up of heart failure and hypokalemia.  Last seen in clinic by myself 5/24  Followed by CHF clinic last seen on August 2023  Followed by nephrology, Dr. Thedore Mins Followed by Dr. Olevia Perches Diabetic nurse at [facility]  Using lymphedema pumps 2 hrs a day Minimal leg edema Weight stable, 162 pounds, down from 188  Remains on torsemide 20 daily and metolazone 2.5 three times a week Sometimes 40 daily PRN for leg swelling Monitors her fluid intake, daily weight  Labs reviewed A1C 7.9 Total chol 232 LDL 157 LDL 176, up from 133 CR 2.2, stable  No Sob with walking Walking with a walker  EKG personally reviewed by myself on todays visit Atrial fibrillation rate 78 bpm no ST or T wave changes  Prior cardiac history reviewed Admitted to the hospital October 2022, large left pleural effusion requiring thoracentesis Rectal bleed, Eliquis discontinued, required transfusion Large pericardial effusion noted, no tamponade on echo Pericardiocentesis performed 700 Follow-up echo stable moderate effusion Required diuresis, transition to torsemide metolazone, Eliquis resumed  PMH:   has a past medical history of A-fib (HCC), CHF (congestive heart failure) (HCC), Chronic heart failure with preserved ejection fraction (HFpEF) (HCC), 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  PSH:    Past Surgical History:  Procedure Laterality Date   BASAL CELL CARCINOMA EXCISION  11/01/2022   CATARACT EXTRACTION W/PHACO Left 10/09/2021   Procedure: CATARACT EXTRACTION PHACO AND INTRAOCULAR LENS PLACEMENT (IOC) LEFT DIABETIC 4.84 00:37.8;  Surgeon: Galen Manila, MD;  Location: [facility];  Service: Ophthalmology;  Laterality: Left;  Diabetic   CATARACT EXTRACTION W/PHACO Right 10/30/2021   Procedure: CATARACT EXTRACTION PHACO AND INTRAOCULAR LENS PLACEMENT (IOC) RIGHT DIABETIC 5.65 00:38.5;  Surgeon: Galen Manila, MD;  Location: [facility];  Service: Ophthalmology;  Laterality: Right;  Diabetic   CHOLECYSTECTOMY     DILATION AND CURETTAGE OF UTERUS     IR FLUORO GUIDE CV LINE RIGHT  08/16/2021   IR THORACENTESIS ASP PLEURAL SPACE W/IMG GUIDE  08/16/2021   PERICARDIOCENTESIS N/A 08/05/2021   Procedure: PERICARDIOCENTESIS;  Surgeon: Marcina Millard, MD;  Location: [facility];  Service: Cardiovascular;  Laterality: N/A;   TEAR DUCT PROBING WITH STRABISMUS REPAIR Right    TONSILLECTOMY      Current Outpatient Medications  Medication Sig Dispense Refill   ACCU-CHEK GUIDE test strip USE TO CHECK BLOOD SUGAR DAILY (Patient not taking: Reported on 04/04/2023)     apixaban (ELIQUIS) 5 MG TABS tablet Take 1 tablet (5 mg total) by mouth 2 (two) times daily. 180 tablet 3   Continuous Glucose Sensor (DEXCOM G7 SENSOR) MISC by Does not apply route.     empagliflozin (JARDIANCE) 25 MG TABS tablet TAKE 1 TABLET BY MOUTH ONCE DAILY BEFOREBREAKFAST 90 tablet 1  folic acid (FOLVITE) 1 MG tablet TAKE 1 TABLET BY MOUTH ONCE DAILY 90 tablet 0   Glucagon (GVOKE HYPOPEN 2-PACK) 0.5 MG/0.1ML SOAJ Inject 1 each into the skin daily as needed.  0.2 mL 12   insulin glargine, 2 Unit Dial, (TOUJEO MAX SOLOSTAR) 300 UNIT/ML Solostar Pen INJECT 25-28 UNITS SUBCUTANEOUSLY AT BEDTIME 9 mL 0   Insulin Pen Needle (PEN NEEDLES) 32G X 4 MM MISC 1 each by Does not apply route 3 (three) times daily. 1000 each 12   Insulin Pen Needle (ULTRACARE PEN NEEDLES) 32G X 4 MM MISC USE 1 PEN ONCE DAILY 100 each 0   Insulin Syringe-Needle U-100 (INSULIN SYRINGE 1CC/31GX5/16") 31G X 5/16" 1 ML MISC 1 each by Does not apply route 3 (three) times daily. 100 each 12   Insulin Syringe/Needle U-500 (BD INSULIN SYRINGE U-500) 31G X 0.5 ML MISC 1 each by Does not apply route 3 (three) times daily as needed. 300 each 4   latanoprost (XALATAN) 0.005 % ophthalmic solution Place 1 drop into both eyes at bedtime.     metolazone (ZAROXOLYN) 2.5 MG tablet TAKE 1 TABLET BY MOUTH EVERY MONDAY WEDNESDAY  AND FRIDAY 36 tablet 2   metoprolol succinate (TOPROL-XL) 25 MG 24 hr tablet Take 0.5 tablets (12.5 mg total) by mouth daily. 90 tablet 1   metroNIDAZOLE (METROGEL) 1 % gel Apply to face every morning.     midodrine (PROAMATINE) 10 MG tablet TAKE 1 TABLET BY MOUTH 3 TIMES DAILY WITH MEALS 270 tablet 3   Multiple Vitamin (MULTIVITAMIN WITH MINERALS) TABS tablet Take 1 tablet by mouth every other day.     NEEDLE, DISP, 30 G (BD DISP NEEDLES) 30G X 1/2" MISC by Does not apply route.     pantoprazole (PROTONIX) 40 MG tablet TAKE 1 TABLET BY MOUTH TWICE DAILY 180 tablet 3   potassium chloride SA (KLOR-CON M) 20 MEQ tablet Take 2 tablets (40 mEq total) by mouth 2 (two) times daily. 360 tablet 0   rosuvastatin (CRESTOR) 5 MG tablet Take 1 tablet (5 mg total) by mouth once a week. 52 tablet 0   Semaglutide (RYBELSUS) 7 MG TABS Take 1 tablet (7 mg total) by mouth daily. 90 tablet 1   spironolactone (ALDACTONE) 50 MG tablet Take by mouth.     torsemide (DEMADEX) 20 MG tablet TAKE 1 TO 2 TABLETS BY MOUTH DAILY. ONLY TAKE 2 TABLETS FOR 1 TO 3 DAYS AND IF PERSISTS, CALL WITH BAD SWELLING  180 tablet 1   No current facility-administered medications for this visit.    Allergies:   Patient has no known allergies.   Social History:  The patient  reports that she has never smoked. She has never used smokeless tobacco. She reports that she does not currently use alcohol. She reports that she does not use drugs.   Family History:   family history includes Breast cancer in her maternal grandmother and mother; Cancer in her brother and brother; Diabetes in her brother; Heart disease in her brother and mother; Heart disease (age of onset: 63) in her father; Osteoporosis in her mother; Parkinson's disease in her brother.   Review of Systems: Review of Systems  Constitutional: Negative.   HENT: Negative.    Respiratory: Negative.    Cardiovascular: Negative.   Gastrointestinal: Negative.   Musculoskeletal: Negative.   Neurological: Negative.   Psychiatric/Behavioral: Negative.    All other systems reviewed and are negative.   PHYSICAL EXAM: VS:  There were no vitals  taken for this visit. , BMI There is no height or weight on file to calculate BMI. Constitutional:  oriented to person, place, and time. No distress.  HENT:  Head: Grossly normal Eyes:  no discharge. No scleral icterus.  Neck: No JVD, no carotid bruits  Cardiovascular: Regular rate and rhythm, no murmurs appreciated Pulmonary/Chest: Clear to auscultation bilaterally, no wheezes or rails Abdominal: Soft.  no distension.  no tenderness.  Musculoskeletal: Normal range of motion Neurological:  normal muscle tone. Coordination normal. No atrophy Skin: Skin warm and dry Psychiatric: normal affect, pleasant  Recent Labs: 12/13/2022: ALT 20; Hemoglobin 15.9; Platelets 361; TSH 2.730 12/20/2022: BUN 56; Creatinine, Ser 2.23; Potassium 4.1; Sodium 135    Lipid Panel Lab Results  Component Value Date   CHOL 232 (H) 12/13/2022   HDL 53 12/13/2022   LDLCALC 157 (H) 12/13/2022   TRIG 123 12/13/2022     Wt Readings  from Last 3 Encounters:  05/30/23 152 lb (68.9 kg)  04/04/23 158 lb 6.4 oz (71.8 kg)  03/14/23 161 lb 12.8 oz (73.4 kg)     ASSESSMENT AND PLAN:  Problem List Items Addressed This Visit   None  Chronic diastolic CHF  euvolemic, lungs clear,minimal leg edema continue torsemide 20 daily with metolazone 2.5 three  days a week Extra torsemide as needed for leg swelling  renal function stable, followed by nephrology  Chronic renal failure Managed by nephrology, creatinine running in the low 2 range,  Hypotension Continue midodrine 10 mg 3 times daily BP stable  Anemia Hemoglobin stable  Permanent atrial fibrillation On anticoagulation, rate controlled on low-dose metoprolol  Diabetes type 2 poorly controlled A1C improving Some low sugars  CHF education continued, daily weights, adjusting of diuretics  Total encounter time more than 40 minutes  Greater than 50% was spent in counseling and coordination of care with the patient    Signed, Dossie Arbour, M.D., Ph.D. [facility] [place], [place] [phone]

## 2023-09-01 ENCOUNTER — Ambulatory Visit: Payer: Medicare PPO | Admitting: Cardiovascular Disease

## 2023-09-01 DIAGNOSIS — I739 Peripheral vascular disease, unspecified: Secondary | ICD-10-CM

## 2023-09-01 DIAGNOSIS — I3139 Other pericardial effusion (noninflammatory): Secondary | ICD-10-CM

## 2023-09-01 DIAGNOSIS — I10 Essential (primary) hypertension: Secondary | ICD-10-CM

## 2023-09-01 DIAGNOSIS — I5032 Chronic diastolic (congestive) heart failure: Secondary | ICD-10-CM

## 2023-09-01 DIAGNOSIS — E1122 Type 2 diabetes mellitus with diabetic chronic kidney disease: Secondary | ICD-10-CM

## 2023-09-01 DIAGNOSIS — I270 Primary pulmonary hypertension: Secondary | ICD-10-CM

## 2023-09-01 DIAGNOSIS — I872 Venous insufficiency (chronic) (peripheral): Secondary | ICD-10-CM

## 2023-09-01 DIAGNOSIS — I4821 Permanent atrial fibrillation: Secondary | ICD-10-CM

## 2023-09-01 DIAGNOSIS — E782 Mixed hyperlipidemia: Secondary | ICD-10-CM

## 2023-09-01 DIAGNOSIS — I890 Lymphedema, not elsewhere classified: Secondary | ICD-10-CM

## 2023-09-02 ENCOUNTER — Ambulatory Visit: Payer: Medicare PPO | Admitting: Family Medicine

## 2023-09-02 ENCOUNTER — Encounter: Payer: Self-pay | Admitting: Family Medicine

## 2023-09-02 VITALS — BP 122/78 | HR 83 | Ht 61.5 in | Wt 152.4 lb

## 2023-09-02 DIAGNOSIS — Z794 Long term (current) use of insulin: Secondary | ICD-10-CM

## 2023-09-02 DIAGNOSIS — E785 Hyperlipidemia, unspecified: Secondary | ICD-10-CM

## 2023-09-02 DIAGNOSIS — Z0000 Encounter for general adult medical examination without abnormal findings: Secondary | ICD-10-CM

## 2023-09-02 DIAGNOSIS — E1121 Type 2 diabetes mellitus with diabetic nephropathy: Secondary | ICD-10-CM

## 2023-09-02 DIAGNOSIS — I129 Hypertensive chronic kidney disease with stage 1 through stage 4 chronic kidney disease, or unspecified chronic kidney disease: Secondary | ICD-10-CM

## 2023-09-02 DIAGNOSIS — E782 Mixed hyperlipidemia: Secondary | ICD-10-CM | POA: Diagnosis not present

## 2023-09-02 DIAGNOSIS — Z23 Encounter for immunization: Secondary | ICD-10-CM

## 2023-09-02 DIAGNOSIS — Z7984 Long term (current) use of oral hypoglycemic drugs: Secondary | ICD-10-CM

## 2023-09-02 LAB — BAYER DCA HB A1C WAIVED: HB A1C (BAYER DCA - WAIVED): 6.3 % — ABNORMAL HIGH (ref 4.8–5.6)

## 2023-09-02 MED ORDER — RYBELSUS 7 MG PO TABS: 7.0000 mg | ORAL_TABLET | Freq: Every day | ORAL | 0 refills | Status: DC

## 2023-09-02 MED ORDER — METOPROLOL SUCCINATE ER 25 MG PO TB24: 12.5000 mg | ORAL_TABLET | Freq: Every day | ORAL | 0 refills | Status: DC

## 2023-09-02 MED ORDER — POTASSIUM CHLORIDE CRYS ER 20 MEQ PO TBCR: 40.0000 meq | EXTENDED_RELEASE_TABLET | Freq: Two times a day (BID) | ORAL | 0 refills | Status: DC

## 2023-09-02 MED ORDER — TOUJEO MAX SOLOSTAR 300 UNIT/ML ~~LOC~~ SOPN: 15.0000 [IU] | PEN_INJECTOR | Freq: Every day | SUBCUTANEOUS | 0 refills | Status: DC

## 2023-09-02 MED ORDER — FOLIC ACID 1 MG PO TABS: 1.0000 mg | ORAL_TABLET | Freq: Every day | ORAL | 0 refills | Status: DC

## 2023-09-02 MED ORDER — EMPAGLIFLOZIN 25 MG PO TABS: ORAL_TABLET | ORAL | 0 refills | Status: DC

## 2023-09-02 MED ORDER — TORSEMIDE 20 MG PO TABS: ORAL_TABLET | ORAL | 0 refills | Status: DC

## 2023-09-02 MED ORDER — PANTOPRAZOLE SODIUM 40 MG PO TBEC: 40.0000 mg | DELAYED_RELEASE_TABLET | Freq: Two times a day (BID) | ORAL | 3 refills | Status: DC

## 2023-09-02 NOTE — Assessment & Plan Note (Signed)
Under good control on current regimen. Continue current regimen. Continue to monitor. Call with any concerns. Refills given. Labs drawn today.   

## 2023-09-02 NOTE — Patient Instructions (Signed)
  Preventative Services:  Health Risk Assessment and Personalized Prevention Plan: Done today Bone Mass Measurements: Up to date Breast Cancer Screening: Up to date CVD Screening: Done today Cervical Cancer Screening: N/A Colon Cancer Screening: Up to date Depression Screening: Done today Diabetes Screening: Done today Glaucoma Screening: See your eye doctor Hepatitis B vaccine: N/A Hepatitis C screening: Up to date HIV Screening: Up to date Flu Vaccine: Given today Lung cancer Screening: N/A Obesity Screening: Done today Pneumonia Vaccines (2): Up to date STI Screening: N/A

## 2023-09-02 NOTE — Progress Notes (Unsigned)
BP 122/78   Pulse 83   Ht 5' 1.5" (1.562 m)   Wt 152 lb 6.4 oz (69.1 kg)   SpO2 96%   BMI 28.33 kg/m    Subjective:    Patient ID: Karen Farley, female    DOB: 1946-09-24, 77 y.o.   MRN: [id number]  HPI: Karen Farley is a 77 y.o. female presenting on 09/02/2023 for comprehensive medical examination. Current medical complaints include:  DIABETES Hypoglycemic episodes:yes Polydipsia/polyuria: no Visual disturbance: no Chest pain: no Paresthesias: no Glucose Monitoring: yes  Accucheck frequency: continuous Taking Insulin?: yes  Long acting insulin: 24 units  Blood Pressure Monitoring: not checking Retinal Examination: Up to Date Foot Exam: Up to Date Diabetic Education: Completed Pneumovax: Up to Date Influenza: Given today Aspirin: no  HYPERTENSION / HYPERLIPIDEMIA Satisfied with current treatment? yes Duration of hypertension: chronic BP monitoring frequency: a few times a month BP medication side effects: no Past BP meds: metoprolol, torsemide Duration of hyperlipidemia: chronic Cholesterol medication side effects: no Cholesterol supplements: none Past cholesterol medications: crestor Medication compliance: excellent compliance Aspirin: no Recent stressors: no Recurrent headaches: no Visual changes: no Palpitations: no Dyspnea: no Chest pain: no Lower extremity edema: no Dizzy/lightheaded: no   She currently lives with: husband Menopausal Symptoms: no  Functional Status Survey: Is the patient deaf or have difficulty hearing?: No Does the patient have difficulty seeing, even when wearing glasses/contacts?: No Does the patient have difficulty concentrating, remembering, or making decisions?: No Does the patient have difficulty walking or climbing stairs?: No Does the patient have difficulty dressing or bathing?: No Does the patient have difficulty doing errands alone such as visiting a doctor's office or shopping?: No     09/02/2023     1:09 PM 05/30/2023    1:36 PM 12/13/2022    1:09 PM 07/23/2022   12:03 PM 06/28/2022    1:28 PM  Fall Risk   Falls in the past year? 0 1 0 1 1  Number falls in past yr: 0 0 0 1 1  Injury with Fall? 0 0 0 1 1  Risk for fall due to : No Fall Risks  No Fall Risks  History of fall(s);Impaired mobility  Follow up Falls evaluation completed  Falls evaluation completed Falls evaluation completed;Education provided;Falls prevention discussed Education provided  Comment     Had PT in the past    Depression Screen    09/02/2023    1:09 PM 05/30/2023    1:37 PM 12/13/2022    1:08 PM 09/04/2022    1:27 PM 07/23/2022   12:17 PM  Depression screen PHQ 2/9  Decreased Interest 0 0 0 0 0  Down, Depressed, Hopeless 0 0 0 0 0  PHQ - 2 Score 0 0 0 0 0  Altered sleeping 1 1 0 2 1  Tired, decreased energy 0 0 0 0 0  Change in appetite 0 0 0 0 0  Feeling bad or failure about yourself  0 0 0 0 0  Trouble concentrating 0 0 0 0 0  Moving slowly or fidgety/restless 0 0 0 0 0  Suicidal thoughts 0 0 0 0 0  PHQ-9 Score 1 1 0 2 1  Difficult doing work/chores Not difficult at all Not difficult at all Not difficult at all Not difficult at all Not difficult at all     Advanced Directives Does patient have a HCPOA?    yes If yes, name and contact information:  Does patient have a  living will or MOST form?  yes  Past Medical History:  Past Medical History:  Diagnosis Date   A-fib (HCC)    CHF (congestive heart failure) (HCC)    Chronic heart failure with preserved ejection fraction (HFpEF) (HCC)    a. 11/2019 Echo: EF 60-65%; b. 07/2021 Echo: EF 60-65%.   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 07/2021 req 2u prbcs. Eval deferred given resolution of bleeding and cardiac issues.   Hyperlipidemia     Hypertension    Lymphedema    Morbid obesity (HCC)    Osteoarthritis of both knees    Osteopenia    Pericardial effusion    a. 07/2021 Echo: Nl EF. Large pericardial effusion-->s/p pericardiocentesis of ; b. 08/23/2021 Echo: EF 60-65%, mild LVH, Nl RV fxn, sev dil LA, moderate pericardial effusion. L pleural effusion.   Permanent atrial fibrillation (HCC)    Pleural effusion, left    a. 07/2021 s/p thoracentesis - .    Surgical History:  Past Surgical History:  Procedure Laterality Date   BASAL CELL CARCINOMA EXCISION  11/01/2022   CATARACT EXTRACTION W/PHACO Left 10/09/2021   Procedure: CATARACT EXTRACTION PHACO AND INTRAOCULAR LENS PLACEMENT (IOC) LEFT DIABETIC 4.84 00:37.8;  Surgeon: Galen Manila, MD;  Location: [facility];  Service: Ophthalmology;  Laterality: Left;  Diabetic   CATARACT EXTRACTION W/PHACO Right 10/30/2021   Procedure: CATARACT EXTRACTION PHACO AND INTRAOCULAR LENS PLACEMENT (IOC) RIGHT DIABETIC 5.65 00:38.5;  Surgeon: Galen Manila, MD;  Location: [facility];  Service: Ophthalmology;  Laterality: Right;  Diabetic   CHOLECYSTECTOMY     DILATION AND CURETTAGE OF UTERUS     IR FLUORO GUIDE CV LINE RIGHT  08/16/2021   IR THORACENTESIS ASP PLEURAL SPACE W/IMG GUIDE  08/16/2021   PERICARDIOCENTESIS N/A 08/05/2021   Procedure: PERICARDIOCENTESIS;  Surgeon: Marcina Millard, MD;  Location: [facility];  Service: Cardiovascular;  Laterality: N/A;   TEAR DUCT PROBING WITH STRABISMUS REPAIR Right    TONSILLECTOMY      Medications:  Current Outpatient Medications on File Prior to Visit  Medication Sig   apixaban (ELIQUIS) 5 MG TABS tablet Take 1 tablet (5 mg total) by mouth 2 (two) times daily.   Continuous Glucose Sensor (DEXCOM G7 SENSOR) MISC by Does not apply route.   Glucagon (GVOKE HYPOPEN 2-PACK) 0.5 MG/0.1ML SOAJ Inject 1 each into the skin daily as needed.   insulin glargine, 2 Unit Dial, (TOUJEO MAX SOLOSTAR) 300  UNIT/ML Solostar Pen INJECT 25-28 UNITS SUBCUTANEOUSLY AT BEDTIME   Insulin Pen Needle (PEN NEEDLES) 32G X 4 MM MISC 1 each by Does not apply route 3 (three) times daily.   Insulin Pen Needle (ULTRACARE PEN NEEDLES) 32G X 4 MM MISC USE 1 PEN ONCE DAILY   Insulin Syringe-Needle U-100 (INSULIN SYRINGE 1CC/31GX5/16") 31G X 5/16" 1 ML MISC 1 each by Does not apply route 3 (three) times daily.   Insulin Syringe/Needle U-500 (BD INSULIN SYRINGE U-500) 31G X 0.5 ML MISC 1 each by Does not apply route 3 (three) times daily as needed.   latanoprost (XALATAN) 0.005 % ophthalmic solution Place 1 drop into both eyes at bedtime.   metolazone (ZAROXOLYN) 2.5 MG tablet TAKE 1 TABLET BY MOUTH EVERY MONDAY WEDNESDAY  AND FRIDAY  metroNIDAZOLE (METROGEL) 1 % gel Apply to face every morning.   midodrine (PROAMATINE) 10 MG tablet TAKE 1 TABLET BY MOUTH 3 TIMES DAILY WITH MEALS   Multiple Vitamin (MULTIVITAMIN WITH MINERALS) TABS tablet Take 1 tablet by mouth every other day.   NEEDLE, DISP, 30 G (BD DISP NEEDLES) 30G X 1/2" MISC by Does not apply route.   rosuvastatin (CRESTOR) 5 MG tablet Take 1 tablet (5 mg total) by mouth once a week.   spironolactone (ALDACTONE) 50 MG tablet Take by mouth.   ACCU-CHEK GUIDE test strip USE TO CHECK BLOOD SUGAR DAILY (Patient not taking: Reported on 04/04/2023)   No current facility-administered medications on file prior to visit.    Allergies:  No Known Allergies  Social History:  Social History   Socioeconomic History   Marital status: Married    Spouse name: Not on file   Number of children: Not on file   Years of education: Not on file   Highest education level: Not on file  Occupational History   Not on file  Tobacco Use   Smoking status: Never   Smokeless tobacco: Never  Vaping Use   Vaping status: Never Used  Substance and Sexual Activity   Alcohol use: Not Currently    Alcohol/week: 0.0 standard drinks of alcohol    Comment: on rare occasion   Drug  use: No   Sexual activity: Not Currently  Other Topics Concern   Not on file  Social History Narrative   Not on file   Social Determinants of Health   Financial Resource Strain: Low Risk  (07/23/2022)   Overall Financial Resource Strain (CARDIA)    Difficulty of Paying Living Expenses: Not hard at all  Food Insecurity: No Food Insecurity (11/26/2022)   Hunger Vital Sign    Worried About Running Out of Food in the Last Year: Never true    Ran Out of Food in the Last Year: Never true  Transportation Needs: No Transportation Needs (11/26/2022)   PRAPARE - Administrator, Civil Service (Medical): No    Lack of Transportation (Non-Medical): No  Physical Activity: Sufficiently Active (07/23/2022)   Exercise Vital Sign    Days of Exercise per Week: 3 days    Minutes of Exercise per Session: 60 min  Stress: No Stress Concern Present (07/23/2022)   [employer] of Occupational Health - Occupational Stress Questionnaire    Feeling of Stress : Not at all  Social Connections: Moderately Integrated (07/23/2022)   Social Connection and Isolation Panel [NHANES]    Frequency of Communication with Friends and Family: Twice a week    Frequency of Social Gatherings with Friends and Family: Once a week    Attends Religious Services: Never    Database administrator or Organizations: Yes    Attends Engineer, structural: More than 4 times per year    Marital Status: Married  Catering manager Violence: Not At Risk (07/23/2022)   Humiliation, Afraid, Rape, and Kick questionnaire    Fear of Current or Ex-Partner: No    Emotionally Abused: No    Physically Abused: No    Sexually Abused: No   Social History   Tobacco Use  Smoking Status Never  Smokeless Tobacco Never   Social History   Substance and Sexual Activity  Alcohol Use Not Currently   Alcohol/week: 0.0 standard drinks of alcohol   Comment: on rare occasion    Family History:  Family History  Problem Relation Age  of Onset   Heart disease Mother        CHF   Osteoporosis Mother    Breast cancer Mother    Heart disease Father 74   Cancer Brother        Colon CA- 2002- Youngest brother   Parkinson's disease Brother        Younger Brother   Heart disease Brother        2 brothers   Diabetes Brother    Cancer Brother    Breast cancer Maternal Grandmother     Past medical history, surgical history, medications, allergies, family history and social history reviewed with patient today and changes made to appropriate areas of the chart.   Review of Systems  Constitutional: Negative.   Respiratory: Negative.    Cardiovascular: Negative.   Musculoskeletal: Negative.   Neurological: Negative.   Psychiatric/Behavioral: Negative.      All other ROS negative except what is listed above and in the HPI.      Objective:    BP 122/78   Pulse 83   Ht 5' 1.5" (1.562 m)   Wt 152 lb 6.4 oz (69.1 kg)   SpO2 96%   BMI 28.33 kg/m   Wt Readings from Last 3 Encounters:  09/02/23 152 lb 6.4 oz (69.1 kg)  05/30/23 152 lb (68.9 kg)  04/04/23 158 lb 6.4 oz (71.8 kg)    Physical Exam Vitals and nursing note reviewed.  Constitutional:      General: She is not in acute distress.    Appearance: Normal appearance. She is not ill-appearing, toxic-appearing or diaphoretic.  HENT:     Head: Normocephalic and atraumatic.     Right Ear: External ear normal.     Left Ear: External ear normal.     Nose: Nose normal.     Mouth/Throat:     Mouth: Mucous membranes are moist.     Pharynx: Oropharynx is clear.  Eyes:     General: No scleral icterus.       Right eye: No discharge.        Left eye: No discharge.     Extraocular Movements: Extraocular movements intact.     Conjunctiva/sclera: Conjunctivae normal.     Pupils: Pupils are equal, round, and reactive to light.  Cardiovascular:     Rate and Rhythm: Normal rate and regular rhythm.     Pulses: Normal pulses.     Heart sounds: Normal heart sounds. No  murmur heard.    No friction rub. No gallop.  Pulmonary:     Effort: Pulmonary effort is normal. No respiratory distress.     Breath sounds: Normal breath sounds. No stridor. No wheezing, rhonchi or rales.  Chest:     Chest wall: No tenderness.  Musculoskeletal:        General: Normal range of motion.     Cervical back: Normal range of motion and neck supple.  Skin:    General: Skin is warm and dry.     Capillary Refill: Capillary refill takes less than 2 seconds.     Coloration: Skin is not jaundiced or pale.     Findings: No bruising, erythema, lesion or rash.  Neurological:     General: No focal deficit present.     Mental Status: She is alert and oriented to person, place, and time. Mental status is at baseline.  Psychiatric:        Mood and Affect: Mood normal.  Behavior: Behavior normal.        Thought Content: Thought content normal.        Judgment: Judgment normal.        09/02/2023    1:28 PM 07/23/2022   12:09 PM 07/17/2021    7:15 PM 07/13/2021    4:26 PM 05/22/2020    1:56 PM  6CIT Screen  What Year? 0 points 0 points 0 points 0 points 0 points  What month? 0 points 0 points 0 points 0 points 0 points  What time? 0 points 0 points 0 points 0 points 0 points  Count back from 20 2 points 0 points 0 points 0 points 0 points  Months in reverse 0 points 0 points 0 points 0 points 0 points  Repeat phrase 2 points 0 points 0 points 2 points 0 points  Total Score 4 points 0 points 0 points 2 points 0 points    Results for orders placed or performed in visit on 05/30/23  Bayer DCA Hb A1c Waived  Result Value Ref Range   HB A1C (BAYER DCA - WAIVED) 7.4 (H) 4.8 - 5.6 %      Assessment & Plan:   Problem List Items Addressed This Visit       Endocrine   Type 2 diabetes mellitus with renal complication (HCC)   Relevant Medications   empagliflozin (JARDIANCE) 25 MG TABS tablet   Semaglutide (RYBELSUS) 7 MG TABS   Other Relevant Orders   Bayer DCA Hb A1c  Waived   Lipid Panel w/o Chol/HDL Ratio   CBC with Differential/Platelet   Comprehensive metabolic panel     Genitourinary   Benign hypertensive renal disease    Under good control on current regimen. Continue current regimen. Continue to monitor. Call with any concerns. Refills given. Labs drawn today.        Other   Hyperlipidemia    Under good control on current regimen. Continue current regimen. Continue to monitor. Call with any concerns. Refills given. Labs drawn today.       Relevant Medications   metoprolol succinate (TOPROL-XL) 25 MG 24 hr tablet   torsemide (DEMADEX) 20 MG tablet   Other Visit Diagnoses     Encounter for Medicare annual wellness exam    -  Primary   Preventative care discussed today.   Needs flu shot       Flu shot given today.   Relevant Orders   Flu Vaccine Trivalent High Dose (Fluad)        Preventative Services:  Health Risk Assessment and Personalized Prevention Plan: Done today Bone Mass Measurements: Up to date Breast Cancer Screening: Up to date CVD Screening: Done today Cervical Cancer Screening: N/A Colon Cancer Screening: Up to date Depression Screening: Done today Diabetes Screening: Done today Glaucoma Screening: See your eye doctor Hepatitis B vaccine: N/A Hepatitis C screening: Up to date HIV Screening: Up to date Flu Vaccine: Given today Lung cancer Screening: N/A Obesity Screening: Done today Pneumonia Vaccines (2): Up to date STI Screening: N/A  Follow up plan: Return in about 3 months (around 12/03/2023) for physical.   LABORATORY TESTING:  - Pap smear: not applicable  IMMUNIZATIONS:   - Tdap: Tetanus vaccination status reviewed: last tetanus booster within 10 years. - Influenza: Administered today - Pneumovax: Up to date - Prevnar: Up to date - Zostavax vaccine: Refused  SCREENING: -Mammogram: Up to date  - Colonoscopy: Not applicable  - Bone Density: Up to date   PATIENT  COUNSELING:   Advised to  take 1 mg of folate supplement per day if capable of pregnancy.   Sexuality: Discussed sexually transmitted diseases, partner selection, use of condoms, avoidance of unintended pregnancy  and contraceptive alternatives.   Advised to avoid cigarette smoking.  I discussed with the patient that most people either abstain from alcohol or drink within safe limits (<=14/week and <=4 drinks/occasion for males, <=7/weeks and <= 3 drinks/occasion for females) and that the risk for alcohol disorders and other health effects rises proportionally with the number of drinks per week and how often a drinker exceeds daily limits.  Discussed cessation/primary prevention of drug use and availability of treatment for abuse.   Diet: Encouraged to adjust caloric intake to maintain  or achieve ideal body weight, to reduce intake of dietary saturated fat and total fat, to limit sodium intake by avoiding high sodium foods and not adding table salt, and to maintain adequate dietary potassium and calcium preferably from fresh fruits, vegetables, and low-fat dairy products.    stressed the importance of regular exercise  Injury prevention: Discussed safety belts, safety helmets, smoke detector, smoking near bedding or upholstery.   Dental health: Discussed importance of regular tooth brushing, flossing, and dental visits.    NEXT PREVENTATIVE PHYSICAL DUE IN 1 YEAR. Return in about 3 months (around 12/03/2023) for physical.

## 2023-09-03 ENCOUNTER — Telehealth: Payer: Self-pay

## 2023-09-03 DIAGNOSIS — E1122 Type 2 diabetes mellitus with diabetic chronic kidney disease: Secondary | ICD-10-CM | POA: Diagnosis not present

## 2023-09-03 DIAGNOSIS — I890 Lymphedema, not elsewhere classified: Secondary | ICD-10-CM | POA: Diagnosis not present

## 2023-09-03 DIAGNOSIS — N184 Chronic kidney disease, stage 4 (severe): Secondary | ICD-10-CM | POA: Diagnosis not present

## 2023-09-03 DIAGNOSIS — E790 Hyperuricemia without signs of inflammatory arthritis and tophaceous disease: Secondary | ICD-10-CM | POA: Diagnosis not present

## 2023-09-03 DIAGNOSIS — R600 Localized edema: Secondary | ICD-10-CM | POA: Diagnosis not present

## 2023-09-03 DIAGNOSIS — E876 Hypokalemia: Secondary | ICD-10-CM | POA: Diagnosis not present

## 2023-09-03 LAB — HEPATIC FUNCTION PANEL
ALT: 13 [IU]/L (ref 0–32)
AST: 20 [IU]/L (ref 0–40)
Albumin: 4.5 g/dL (ref 3.8–4.8)
Alkaline Phosphatase: 129 [IU]/L — ABNORMAL HIGH (ref 44–121)
Bilirubin Total: 0.7 mg/dL (ref 0.0–1.2)
Bilirubin, Direct: 0.21 mg/dL (ref 0.00–0.40)
Total Protein: 6.8 g/dL (ref 6.0–8.5)

## 2023-09-03 LAB — LIPID PANEL W/O CHOL/HDL RATIO
Cholesterol, Total: 202 mg/dL — ABNORMAL HIGH (ref 100–199)
HDL: 45 mg/dL (ref >39)
LDL Chol Calc (NIH): 134 mg/dL — ABNORMAL HIGH (ref 0–99)
Triglycerides: 125 mg/dL (ref 0–149)
VLDL Cholesterol Cal: 23 mg/dL (ref 5–40)

## 2023-09-03 NOTE — Telephone Encounter (Signed)
[facility] called to clarify patient's instructions for [facility] prescription, as it has two different instructions. Pharmacist needs clarification from provider. Please advise?

## 2023-09-04 MED ORDER — TOUJEO MAX SOLOSTAR 300 UNIT/ML ~~LOC~~ SOPN: 15.0000 [IU] | PEN_INJECTOR | Freq: Every day | SUBCUTANEOUS | 0 refills | Status: DC

## 2023-09-04 NOTE — Assessment & Plan Note (Signed)
Doing great with A1c of 6.3. Given hypoglycemia, will cut her insulin down to 15 units daily and recheck in 3 months. May be able to go up on rybelsus and stop insulin next visit pending results.

## 2023-09-04 NOTE — Telephone Encounter (Signed)
New rx sent to her pharmacy.

## 2023-09-08 ENCOUNTER — Encounter: Payer: Self-pay | Admitting: Family Medicine

## 2023-09-08 NOTE — Progress Notes (Signed)
Printed and mailed on 09/08/2023.

## 2023-09-16 ENCOUNTER — Ambulatory Visit: Payer: Medicare PPO | Admitting: Family Medicine

## 2023-09-16 ENCOUNTER — Ambulatory Visit: Payer: Self-pay

## 2023-09-16 VITALS — BP 137/84 | HR 92 | Temp 97.9°F | Ht 61.02 in | Wt 149.2 lb

## 2023-09-16 DIAGNOSIS — M13872 Other specified arthritis, left ankle and foot: Secondary | ICD-10-CM | POA: Diagnosis not present

## 2023-09-16 DIAGNOSIS — R600 Localized edema: Secondary | ICD-10-CM | POA: Diagnosis not present

## 2023-09-16 DIAGNOSIS — M13871 Other specified arthritis, right ankle and foot: Secondary | ICD-10-CM | POA: Diagnosis not present

## 2023-09-16 MED ORDER — PREDNISONE 10 MG PO TABS: ORAL_TABLET | ORAL | 0 refills | Status: DC

## 2023-09-16 MED ORDER — ALLOPURINOL 100 MG PO TABS
100.0000 mg | ORAL_TABLET | Freq: Every day | ORAL | 6 refills | Status: DC
Start: 2023-09-16 — End: 2023-12-19

## 2023-09-16 NOTE — Patient Instructions (Addendum)
Increase daily water intake to 4.5 large water bottles daily, which includes 16 ounces for one water bottle.   What are the best foods to eat when you have gout? While eating particular foods won't be enough to make gout go away, studies suggest that certain foods and drinks may help reduce uric acid in your body. For example:  Skim milk. Some early research suggests that drinking skim milk may help reduce uric acid and gout flare-ups. It speeds up the excretion of uric acid in your urine and also reduces your body's inflammatory response to uric acid crystals in your joints. Cherries. Scientists are currently researching the benefits of cherries and cherry juice for managing gout symptoms, and early results are promising. Cherries have known anti-inflammatory properties, and they may also help reduce uric acid in your body. Coffee. You may have heard that coffee is acidic, but the type of acid in coffee is very different from uric acid. In fact, drinking coffee daily can reduce your uric acid levels by several means. It slows the breakdown of purine into uric acid and speeds the rate of excretion. Water. People who drink five to eight glasses of water a day are less likely to experience gout symptoms. This makes sense since your kidneys use water to excrete uric acid in your urine. Water is also good for kidney health. Impaired kidney function is one factor that can contribute to gout.

## 2023-09-16 NOTE — Progress Notes (Signed)
BP 137/84   Pulse 92   Temp 97.9 F (36.6 C) (Oral)   Ht 5' 1.02" (1.55 m)   Wt 149 lb 3.2 oz (67.7 kg)   SpO2 97%   BMI 28.17 kg/m    Subjective:    Patient ID: Karen Farley, female    DOB: October 13, 1946, 77 y.o.   MRN: [id number]  HPI: Karen Farley is a 77 y.o. female  Chief Complaint  Patient presents with   Toe Pain   TOE PAIN Started 5 days ago, right more than left  Duration:5 days Involved toe: rightand left and big toe  Mechanism of injury: unknown Onset: sudden Severity: 7/10  Quality: tender and throbbing Frequency: constant now but before was intermimttent Radiation: no Aggravating factors: walking  Alleviating factors: nothing  Status: worse Treatments attempted:  Tramadol helped for a little bit   Relief with NSAIDs?: No NSAIDs Taken Morning stiffness: no Redness: yes  Bruising: no Swelling: yes Paresthesias / decreased sensation: no Fevers: no   Relevant past medical, surgical, family and social history reviewed and updated as indicated. Interim medical history since our last visit reviewed. Allergies and medications reviewed and updated.  Review of Systems  Constitutional:  Negative for fever.  Respiratory: Negative.    Musculoskeletal:  Positive for arthralgias.  Skin:  Positive for color change (Erythema of right and left big toe with edema).    Per HPI unless specifically indicated above     Objective:    BP 137/84   Pulse 92   Temp 97.9 F (36.6 C) (Oral)   Ht 5' 1.02" (1.55 m)   Wt 149 lb 3.2 oz (67.7 kg)   SpO2 97%   BMI 28.17 kg/m   Wt Readings from Last 3 Encounters:  09/16/23 149 lb 3.2 oz (67.7 kg)  09/02/23 152 lb 6.4 oz (69.1 kg)  05/30/23 152 lb (68.9 kg)    Physical Exam Vitals and nursing note reviewed.  Constitutional:      General: She is awake. She is not in acute distress.    Appearance: Normal appearance. She is well-developed and well-groomed. She is not ill-appearing.  HENT:     Head:  Normocephalic and atraumatic.     Right Ear: Hearing and external ear normal. No drainage.     Left Ear: Hearing and external ear normal. No drainage.     Nose: Nose normal.  Eyes:     General: Lids are normal.        Right eye: No discharge.        Left eye: No discharge.     Conjunctiva/sclera: Conjunctivae normal.  Cardiovascular:     Rate and Rhythm: Normal rate and regular rhythm.     Heart sounds: Normal heart sounds, S1 normal and S2 normal. No murmur heard.    No gallop.  Pulmonary:     Effort: Pulmonary effort is normal. No accessory muscle usage or respiratory distress.     Breath sounds: Normal breath sounds.  Musculoskeletal:     Cervical back: Full passive range of motion without pain and normal range of motion.     Right lower leg: No edema.     Left lower leg: No edema.     Right foot: Swelling and tenderness present.     Left foot: Swelling and tenderness present.     Comments: Bilateral great toe  Skin:    General: Skin is warm and dry.     Capillary Refill: Capillary refill takes  less than 2 seconds.     Findings: Erythema (w edema) present.     Comments: Bilateral great toe  Neurological:     Mental Status: She is alert and oriented to person, place, and time.  Psychiatric:        Attention and Perception: Attention normal.        Mood and Affect: Mood normal.        Speech: Speech normal.        Behavior: Behavior normal. Behavior is cooperative.        Thought Content: Thought content normal.     Results for orders placed or performed in visit on 09/16/23  Uric acid  Result Value Ref Range   Uric Acid 7.3 3.1 - 7.9 mg/dL      Assessment & Plan:   Problem List Items Addressed This Visit     Edema of toe - Primary    Acute, ongoing of bilateral great toe. Uric acid level ordered, previous uric acid 8.1. Will treat with 6 day prednisone taper and start Allopurinol 100 MG daily. Recommend increased water intake, instructed on foods to avoid with high  purine content. Return in 2 weeks, call sooner if concerns arise.       Relevant Orders   Uric acid (Completed)     Follow up plan: Return for Follow up gout , Follow up.

## 2023-09-16 NOTE — Telephone Encounter (Signed)
     Chief Complaint: Pain right big toe. Seen at [facility] and told to follow up with PCP for possible gout. Symptoms: Pain, swelling Frequency: Last week Pertinent Negatives: Patient denies fever Disposition: [] ED /[] Urgent Care (no appt availability in office) / [x] Appointment(In office/virtual)/ []  [place] [facility]/ [] Home Care/ [] Refused Recommended Disposition /[] [facility]/ []  Follow-up with PCP Additional Notes: Pt. Agrees with appointment.  Reason for Disposition  [1] SEVERE pain (e.g., excruciating, unable to do any normal activities) AND [2] not improved after 2 hours of pain medicine  Answer Assessment - Initial Assessment Questions 1. ONSET: "When did the pain start?"      Last week 2. LOCATION: "Where is the pain located?"   (e.g., around nail, entire toe, at foot joint)      Right big toe 3. PAIN: "How bad is the pain?"    (Scale 1-10; or mild, moderate, severe)   -  MILD (1-3): doesn't interfere with normal activities    -  MODERATE (4-7): interferes with normal activities (e.g., work or school) or awakens from sleep, limping    -  SEVERE (8-10): excruciating pain, unable to do any normal activities, unable to walk     Severe 4. APPEARANCE: "What does the toe look like?" (e.g., redness, swelling, bruising, pallor)     Swollen, red 5. CAUSE: "What do you think is causing the toe pain?"     Maybe gout 6. OTHER SYMPTOMS: "Do you have any other symptoms?" (e.g., leg pain, rash, fever, numbness)     Above 7. PREGNANCY: "Is there any chance you are pregnant?" "When was your last menstrual period?"     No  Protocols used: Toe Pain-A-AH

## 2023-09-17 LAB — URIC ACID: Uric Acid: 7.3 mg/dL (ref 3.1–7.9)

## 2023-09-21 DIAGNOSIS — R600 Localized edema: Secondary | ICD-10-CM | POA: Insufficient documentation

## 2023-09-21 DIAGNOSIS — M109 Gout, unspecified: Secondary | ICD-10-CM | POA: Insufficient documentation

## 2023-09-21 NOTE — Assessment & Plan Note (Addendum)
Acute, ongoing of bilateral great toe. Uric acid level ordered, previous uric acid 8.1. Will treat with 6 day prednisone taper and start Allopurinol 100 MG daily. Recommend increased water intake, instructed on foods to avoid with high purine content. Return in 2 weeks, call sooner if concerns arise.

## 2023-09-23 NOTE — Progress Notes (Signed)
Hi Karen Farley, your uric acid levels returned as 7.3. Continue with plan as discussed and follow up as scheduled. Thank you for allowing me to participate in your care.

## 2023-09-30 ENCOUNTER — Encounter: Payer: Self-pay | Admitting: Family Medicine

## 2023-09-30 ENCOUNTER — Ambulatory Visit: Payer: Medicare PPO | Admitting: Family Medicine

## 2023-09-30 VITALS — BP 123/71 | HR 96 | Ht 61.0 in | Wt 148.2 lb

## 2023-09-30 DIAGNOSIS — Z794 Long term (current) use of insulin: Secondary | ICD-10-CM

## 2023-09-30 DIAGNOSIS — E1121 Type 2 diabetes mellitus with diabetic nephropathy: Secondary | ICD-10-CM | POA: Diagnosis not present

## 2023-09-30 DIAGNOSIS — M10079 Idiopathic gout, unspecified ankle and foot: Secondary | ICD-10-CM | POA: Insufficient documentation

## 2023-09-30 MED ORDER — FOLIC ACID 1 MG PO TABS: 1.0000 mg | ORAL_TABLET | Freq: Every day | ORAL | 3 refills | Status: DC

## 2023-09-30 NOTE — Progress Notes (Signed)
BP 123/71   Pulse 96   Ht 5\' 1"  (1.549 m)   Wt 148 lb 3.2 oz (67.2 kg)   SpO2 95%   BMI 28.00 kg/m    Subjective:    Patient ID: Karen Farley, female    DOB: 1946-09-02, 77 y.o.   MRN: [id number]  HPI: Karen Farley is a 77 y.o. female  Chief Complaint  Patient presents with   Edema of Toe   Diabetes   Had to be on prednisone for a gout flare and her sugars went up quite a bit. No more pain. Generally feeling well. No other concerns.   DIABETES Hypoglycemic episodes:no Polydipsia/polyuria: no Visual disturbance: no Chest pain: no Paresthesias: no Glucose Monitoring: yes  Accucheck frequency: continuous  Fasting glucose: 145  Post prandial: up to 400 Taking Insulin?: yes  Long acting insulin: 20-28 units of toujeo Blood Pressure Monitoring: not checking Retinal Examination: Up to Date Foot Exam: Up to Date Diabetic Education: Completed Pneumovax: Up to Date Influenza: Up to Date Aspirin: no  Relevant past medical, surgical, family and social history reviewed and updated as indicated. Interim medical history since our last visit reviewed. Allergies and medications reviewed and updated.  Review of Systems  Constitutional: Negative.   Respiratory: Negative.    Cardiovascular: Negative.   Gastrointestinal: Negative.   Musculoskeletal: Negative.   Psychiatric/Behavioral: Negative.      Per HPI unless specifically indicated above     Objective:    BP 123/71   Pulse 96   Ht 5\' 1"  (1.549 m)   Wt 148 lb 3.2 oz (67.2 kg)   SpO2 95%   BMI 28.00 kg/m   Wt Readings from Last 3 Encounters:  09/30/23 148 lb 3.2 oz (67.2 kg)  09/16/23 149 lb 3.2 oz (67.7 kg)  09/02/23 152 lb 6.4 oz (69.1 kg)    Physical Exam Vitals and nursing note reviewed.  Constitutional:      General: She is not in acute distress.    Appearance: Normal appearance. She is not ill-appearing, toxic-appearing or diaphoretic.  HENT:     Head: Normocephalic and atraumatic.      Right Ear: External ear normal.     Left Ear: External ear normal.     Nose: Nose normal.     Mouth/Throat:     Mouth: Mucous membranes are moist.     Pharynx: Oropharynx is clear.  Eyes:     General: No scleral icterus.       Right eye: No discharge.        Left eye: No discharge.     Extraocular Movements: Extraocular movements intact.     Conjunctiva/sclera: Conjunctivae normal.     Pupils: Pupils are equal, round, and reactive to light.  Cardiovascular:     Rate and Rhythm: Normal rate and regular rhythm.     Pulses: Normal pulses.     Heart sounds: Normal heart sounds. No murmur heard.    No friction rub. No gallop.  Pulmonary:     Effort: Pulmonary effort is normal. No respiratory distress.     Breath sounds: Normal breath sounds. No stridor. No wheezing, rhonchi or rales.  Chest:     Chest wall: No tenderness.  Musculoskeletal:        General: Normal range of motion.     Cervical back: Normal range of motion and neck supple.  Skin:    General: Skin is warm and dry.     Capillary Refill: Capillary refill  takes less than 2 seconds.     Coloration: Skin is not jaundiced or pale.     Findings: No bruising, erythema, lesion or rash.  Neurological:     General: No focal deficit present.     Mental Status: She is alert and oriented to person, place, and time. Mental status is at baseline.  Psychiatric:        Mood and Affect: Mood normal.        Behavior: Behavior normal.        Thought Content: Thought content normal.        Judgment: Judgment normal.     Results for orders placed or performed in visit on 09/16/23  Uric acid  Result Value Ref Range   Uric Acid 7.3 3.1 - 7.9 mg/dL      Assessment & Plan:   Problem List Items Addressed This Visit       Endocrine   Type 2 diabetes mellitus with renal complication (HCC)    Will not change any of her medicine due to prednisone burst. Will check labs again in February. Continue current regimen.          Musculoskeletal and Integument   Acute idiopathic gout involving toe - Primary    Tolerating her allopurinol well. No concerns. Continue to monitor. Call with any concerns. Rechecking labs today.      Relevant Orders   Uric acid   Comprehensive metabolic panel     Follow up plan: Return in about 2 months (around 12/01/2023).

## 2023-09-30 NOTE — Assessment & Plan Note (Signed)
Tolerating her allopurinol well. No concerns. Continue to monitor. Call with any concerns. Rechecking labs today.

## 2023-09-30 NOTE — Assessment & Plan Note (Signed)
Will not change any of her medicine due to prednisone burst. Will check labs again in February. Continue current regimen.

## 2023-10-01 LAB — COMPREHENSIVE METABOLIC PANEL
ALT: 14 [IU]/L (ref 0–32)
AST: 15 [IU]/L (ref 0–40)
Albumin: 4.2 g/dL (ref 3.8–4.8)
Alkaline Phosphatase: 128 [IU]/L — ABNORMAL HIGH (ref 44–121)
BUN/Creatinine Ratio: 21 (ref 12–28)
BUN: 43 mg/dL — ABNORMAL HIGH (ref 8–27)
Bilirubin Total: 0.6 mg/dL (ref 0.0–1.2)
CO2: 23 mmol/L (ref 20–29)
Calcium: 10.1 mg/dL (ref 8.7–10.3)
Chloride: 95 mmol/L — ABNORMAL LOW (ref 96–106)
Creatinine, Ser: 2.01 mg/dL — ABNORMAL HIGH (ref 0.57–1.00)
Globulin, Total: 2.2 g/dL (ref 1.5–4.5)
Glucose: 186 mg/dL — ABNORMAL HIGH (ref 70–99)
Potassium: 4.5 mmol/L (ref 3.5–5.2)
Sodium: 135 mmol/L (ref 134–144)
Total Protein: 6.4 g/dL (ref 6.0–8.5)
eGFR: 25 mL/min/{1.73_m2} — ABNORMAL LOW (ref >59)

## 2023-10-01 LAB — URIC ACID: Uric Acid: 5 mg/dL (ref 3.1–7.9)

## 2023-10-17 DIAGNOSIS — H43812 Vitreous degeneration, left eye: Secondary | ICD-10-CM | POA: Diagnosis not present

## 2023-10-17 DIAGNOSIS — E119 Type 2 diabetes mellitus without complications: Secondary | ICD-10-CM | POA: Diagnosis not present

## 2023-10-17 DIAGNOSIS — H40053 Ocular hypertension, bilateral: Secondary | ICD-10-CM | POA: Diagnosis not present

## 2023-10-17 LAB — HM DIABETES EYE EXAM

## 2023-10-26 NOTE — Progress Notes (Deleted)
 Cardiology Office Note  Date:  10/26/2023   ID:  Karen Farley, DOB 1946/03/11, MRN [id number]  PCP:  Vicci Duwaine SQUIBB, DO   No chief complaint on file.   HPI:  78 year old female with a history of  HFpEF,  hypertension,  stage III chronic kidney disease,  lymphedema,  permanent atrial fibrillation,  hyperlipidemia,  diabetes,  pericardial effusion status post pericardiocentesis,  pleural effusion status post thoracentesis, and  GI bleed,  who presents for follow-up of heart failure and hypokalemia.  Last seen in clinic by myself 5/24  Followed by CHF clinic last seen on August 2023  Followed by nephrology, Dr. Dennise Followed by Dr. Duwaine Vicci Diabetic nurse at [facility]  Using lymphedema pumps 2 hrs a day Minimal leg edema Weight stable, 162 pounds, down from 188  Remains on torsemide  20 daily and metolazone  2.5 three times a week Sometimes 40 daily PRN for leg swelling Monitors her fluid intake, daily weight  Labs reviewed A1C 7.9 Total chol 232 LDL 157 LDL 176, up from 133 CR 2.2, stable  No Sob with walking Walking with a walker  EKG personally reviewed by myself on todays visit Atrial fibrillation rate 78 bpm no ST or T wave changes  Prior cardiac history reviewed Admitted to the hospital October 2022, large left pleural effusion requiring thoracentesis Rectal bleed, Eliquis  discontinued, required transfusion Large pericardial effusion noted, no tamponade on echo Pericardiocentesis performed 700 Follow-up echo stable moderate effusion Required diuresis, transition to torsemide  metolazone , Eliquis  resumed  PMH:   has a past medical history of A-fib (HCC), CHF (congestive heart failure) (HCC), Chronic heart failure with preserved ejection fraction (HFpEF) (HCC), 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  PSH:    Past Surgical History:  Procedure Laterality Date   BASAL CELL CARCINOMA EXCISION  11/01/2022   CATARACT EXTRACTION W/PHACO Left 10/09/2021   Procedure: CATARACT EXTRACTION PHACO AND INTRAOCULAR LENS PLACEMENT (IOC) LEFT DIABETIC 4.84 00:37.8;  Surgeon: Jaye Fallow, MD;  Location: [facility];  Service: Ophthalmology;  Laterality: Left;  Diabetic   CATARACT EXTRACTION W/PHACO Right 10/30/2021   Procedure: CATARACT EXTRACTION PHACO AND INTRAOCULAR LENS PLACEMENT (IOC) RIGHT DIABETIC 5.65 00:38.5;  Surgeon: Jaye Fallow, MD;  Location: [facility];  Service: Ophthalmology;  Laterality: Right;  Diabetic   CHOLECYSTECTOMY     DILATION AND CURETTAGE OF UTERUS     IR FLUORO GUIDE CV LINE RIGHT  08/16/2021   IR THORACENTESIS ASP PLEURAL SPACE W/IMG GUIDE  08/16/2021   PERICARDIOCENTESIS N/A 08/05/2021   Procedure: PERICARDIOCENTESIS;  Surgeon: Ammon Blunt, MD;  Location: [facility] IN[facility]AS[facility];  Service: Cardiovascular;  Laterality: N/A;   TEAR DUCT PROBING WITH STRABISMUS REPAIR Right    TONSILLECTOMY      Current Outpatient Medications  Medication Sig Dispense Refill   allopurinol  (ZYLOPRIM ) 100 MG tablet Take 1 tablet (100 mg total) by mouth daily. 30 tablet 6   apixaban  (ELIQUIS ) 5 MG TABS tablet Take 1 tablet (5 mg total) by mouth 2 (two) times daily. 180 tablet 3   Continuous Glucose Sensor (DEXCOM G7 SENSOR) MISC by Does not apply route.     empagliflozin  (JARDIANCE ) 25 MG TABS tablet TAKE 1 TABLET BY MOUTH ONCE DAILY BEFOREBREAKFAST 90 tablet 0  folic acid  (FOLVITE ) 1 MG tablet Take 1 tablet (1 mg total) by mouth daily. 90 tablet 3   Glucagon  (GVOKE HYPOPEN  2-PACK) 0.5 MG/0.1ML SOAJ Inject 1 each into the skin daily as needed.  0.2 mL 12   insulin  glargine, 2 Unit Dial, (TOUJEO  MAX SOLOSTAR) 300 UNIT/ML Solostar Pen Inject 15 Units into the skin daily. 9 mL 0   Insulin  Pen Needle (PEN NEEDLES) 32G X 4 MM MISC 1 each by Does not apply route 3 (three) times daily. 1000 each 12   Insulin  Pen Needle (ULTRACARE PEN NEEDLES) 32G X 4 MM MISC USE 1 PEN ONCE DAILY 100 each 0   Insulin  Syringe-Needle U-100 (INSULIN  SYRINGE 1CC/31GX5/16) 31G X 5/16 1 ML MISC 1 each by Does not apply route 3 (three) times daily. 100 each 12   Insulin  Syringe/Needle U-500 (BD INSULIN  SYRINGE U-500) 31G X 0.5 ML MISC 1 each by Does not apply route 3 (three) times daily as needed. 300 each 4   latanoprost  (XALATAN ) 0.005 % ophthalmic solution Place 1 drop into both eyes at bedtime.     metolazone  (ZAROXOLYN ) 2.5 MG tablet TAKE 1 TABLET BY MOUTH EVERY MONDAY WEDNESDAY  AND FRIDAY 36 tablet 2   metoprolol  succinate (TOPROL -XL) 25 MG 24 hr tablet Take 0.5 tablets (12.5 mg total) by mouth daily. 90 tablet 0   metroNIDAZOLE (METROGEL) 1 % gel Apply to face every morning.     midodrine  (PROAMATINE ) 10 MG tablet TAKE 1 TABLET BY MOUTH 3 TIMES DAILY WITH MEALS 270 tablet 3   NEEDLE, DISP, 30 G (BD DISP NEEDLES) 30G X 1/2 MISC by Does not apply route.     pantoprazole  (PROTONIX ) 40 MG tablet Take 1 tablet (40 mg total) by mouth 2 (two) times daily. 180 tablet 3   potassium chloride  SA (KLOR-CON  M) 20 MEQ tablet Take 2 tablets (40 mEq total) by mouth 2 (two) times daily. 360 tablet 0   rosuvastatin  (CRESTOR ) 5 MG tablet Take 1 tablet (5 mg total) by mouth once a week. 52 tablet 0   Semaglutide  (RYBELSUS ) 7 MG TABS Take 1 tablet (7 mg total) by mouth daily. 90 tablet 0   spironolactone  (ALDACTONE ) 50 MG tablet Take by mouth.     torsemide  (DEMADEX ) 20 MG tablet TAKE 1 TO 2 TABLETS BY MOUTH DAILY. ONLY TAKE 2 TABLETS FOR 1 TO 3 DAYS AND IF PERSISTS, CALL WITH BAD SWELLING 180 tablet 0   No current facility-administered medications for this visit.     Allergies:   Patient has no known allergies.   Social History:  The patient  reports that she has never smoked. She has never used smokeless tobacco. She reports that she does not currently use alcohol. She reports that she does not use drugs.   Family History:   family history includes Breast cancer in her maternal grandmother and mother; Cancer in her brother and brother; Diabetes in her brother; Heart disease in her brother and mother; Heart disease (age of onset: 71) in her father; Osteoporosis in her mother; Parkinson's disease in her brother.   Review of Systems: Review of Systems  Constitutional: Negative.   HENT: Negative.    Respiratory: Negative.    Cardiovascular: Negative.   Gastrointestinal: Negative.   Musculoskeletal: Negative.   Neurological: Negative.   Psychiatric/Behavioral: Negative.    All other systems reviewed and are negative.   PHYSICAL EXAM: VS:  There were no vitals taken for this visit. , BMI There is no height or  weight on file to calculate BMI. Constitutional:  oriented to person, place, and time. No distress.  HENT:  Head: Grossly normal Eyes:  no discharge. No scleral icterus.  Neck: No JVD, no carotid bruits  Cardiovascular: Regular rate and rhythm, no murmurs appreciated Pulmonary/Chest: Clear to auscultation bilaterally, no wheezes or rails Abdominal: Soft.  no distension.  no tenderness.  Musculoskeletal: Normal range of motion Neurological:  normal muscle tone. Coordination normal. No atrophy Skin: Skin warm and dry Psychiatric: normal affect, pleasant  Recent Labs: 12/13/2022: Hemoglobin 15.9; Platelets 361; TSH 2.730 09/30/2023: ALT 14; BUN 43; Creatinine, Ser 2.01; Potassium 4.5; Sodium 135    Lipid Panel Lab Results  Component Value Date   CHOL 202 (H) 09/02/2023   HDL 45 09/02/2023   LDLCALC 134 (H) 09/02/2023   TRIG 125 09/02/2023     Wt Readings from Last 3 Encounters:  09/30/23 148 lb 3.2 oz (67.2 kg)  09/16/23 149  lb 3.2 oz (67.7 kg)  09/02/23 152 lb 6.4 oz (69.1 kg)     ASSESSMENT AND PLAN:  Problem List Items Addressed This Visit   None  Chronic diastolic CHF  euvolemic, lungs clear,minimal leg edema continue torsemide  20 daily with metolazone  2.5 three  days a week Extra torsemide  as needed for leg swelling  renal function stable, followed by nephrology  Chronic renal failure Managed by nephrology, creatinine running in the low 2 range,  Hypotension Continue midodrine  10 mg 3 times daily BP stable  Anemia Hemoglobin stable  Permanent atrial fibrillation On anticoagulation, rate controlled on low-dose metoprolol   Diabetes type 2 poorly controlled A1C improving Some low sugars  CHF education continued, daily weights, adjusting of diuretics  Total encounter time more than 40 minutes  Greater than 50% was spent in counseling and coordination of care with the patient    Signed, Velinda Lunger, M.D., Ph.D. [facility] [place], [place] [phone]

## 2023-10-27 ENCOUNTER — Ambulatory Visit: Payer: Medicare PPO | Admitting: Cardiovascular Disease

## 2023-10-27 DIAGNOSIS — I10 Essential (primary) hypertension: Secondary | ICD-10-CM

## 2023-10-27 DIAGNOSIS — E1122 Type 2 diabetes mellitus with diabetic chronic kidney disease: Secondary | ICD-10-CM

## 2023-10-27 DIAGNOSIS — I3139 Other pericardial effusion (noninflammatory): Secondary | ICD-10-CM

## 2023-10-27 DIAGNOSIS — I270 Primary pulmonary hypertension: Secondary | ICD-10-CM

## 2023-10-27 DIAGNOSIS — I890 Lymphedema, not elsewhere classified: Secondary | ICD-10-CM

## 2023-10-27 DIAGNOSIS — I4821 Permanent atrial fibrillation: Secondary | ICD-10-CM

## 2023-10-27 DIAGNOSIS — I739 Peripheral vascular disease, unspecified: Secondary | ICD-10-CM

## 2023-10-27 DIAGNOSIS — E782 Mixed hyperlipidemia: Secondary | ICD-10-CM

## 2023-10-27 DIAGNOSIS — I5032 Chronic diastolic (congestive) heart failure: Secondary | ICD-10-CM

## 2023-11-13 ENCOUNTER — Encounter: Payer: Self-pay | Admitting: Family Medicine

## 2023-11-13 DIAGNOSIS — E1129 Type 2 diabetes mellitus with other diabetic kidney complication: Secondary | ICD-10-CM | POA: Diagnosis not present

## 2023-11-20 DIAGNOSIS — E790 Hyperuricemia without signs of inflammatory arthritis and tophaceous disease: Secondary | ICD-10-CM | POA: Diagnosis not present

## 2023-11-20 DIAGNOSIS — R600 Localized edema: Secondary | ICD-10-CM | POA: Diagnosis not present

## 2023-11-20 DIAGNOSIS — I890 Lymphedema, not elsewhere classified: Secondary | ICD-10-CM | POA: Diagnosis not present

## 2023-11-20 DIAGNOSIS — E1122 Type 2 diabetes mellitus with diabetic chronic kidney disease: Secondary | ICD-10-CM | POA: Diagnosis not present

## 2023-11-20 DIAGNOSIS — E876 Hypokalemia: Secondary | ICD-10-CM | POA: Diagnosis not present

## 2023-11-20 DIAGNOSIS — N184 Chronic kidney disease, stage 4 (severe): Secondary | ICD-10-CM | POA: Diagnosis not present

## 2023-11-26 DIAGNOSIS — R600 Localized edema: Secondary | ICD-10-CM | POA: Diagnosis not present

## 2023-11-26 DIAGNOSIS — E876 Hypokalemia: Secondary | ICD-10-CM | POA: Diagnosis not present

## 2023-11-26 DIAGNOSIS — E1122 Type 2 diabetes mellitus with diabetic chronic kidney disease: Secondary | ICD-10-CM | POA: Diagnosis not present

## 2023-11-26 DIAGNOSIS — I890 Lymphedema, not elsewhere classified: Secondary | ICD-10-CM | POA: Diagnosis not present

## 2023-11-26 DIAGNOSIS — N184 Chronic kidney disease, stage 4 (severe): Secondary | ICD-10-CM | POA: Diagnosis not present

## 2023-11-28 ENCOUNTER — Telehealth: Payer: Self-pay | Admitting: Family Medicine

## 2023-11-28 NOTE — Telephone Encounter (Signed)
 Patient dropped off forms for Rybelsus  7mg  for provider to review. Placing forms in providers folder.

## 2023-12-01 ENCOUNTER — Ambulatory Visit: Payer: Medicare PPO | Admitting: Family Medicine

## 2023-12-04 ENCOUNTER — Ambulatory Visit: Payer: Self-pay | Admitting: Family Medicine

## 2023-12-08 ENCOUNTER — Ambulatory Visit: Payer: Medicare PPO | Admitting: Family Medicine

## 2023-12-15 ENCOUNTER — Encounter: Payer: Self-pay | Admitting: Family Medicine

## 2023-12-19 ENCOUNTER — Ambulatory Visit: Payer: Medicare PPO | Admitting: Family Medicine

## 2023-12-19 ENCOUNTER — Encounter: Payer: Self-pay | Admitting: Family Medicine

## 2023-12-19 VITALS — BP 107/72 | HR 86 | Temp 98.6°F | Resp 15 | Wt 152.0 lb

## 2023-12-19 DIAGNOSIS — Z780 Asymptomatic menopausal state: Secondary | ICD-10-CM

## 2023-12-19 DIAGNOSIS — I5033 Acute on chronic diastolic (congestive) heart failure: Secondary | ICD-10-CM | POA: Diagnosis not present

## 2023-12-19 DIAGNOSIS — E782 Mixed hyperlipidemia: Secondary | ICD-10-CM

## 2023-12-19 DIAGNOSIS — I129 Hypertensive chronic kidney disease with stage 1 through stage 4 chronic kidney disease, or unspecified chronic kidney disease: Secondary | ICD-10-CM | POA: Diagnosis not present

## 2023-12-19 DIAGNOSIS — E1122 Type 2 diabetes mellitus with diabetic chronic kidney disease: Secondary | ICD-10-CM

## 2023-12-19 DIAGNOSIS — M10079 Idiopathic gout, unspecified ankle and foot: Secondary | ICD-10-CM

## 2023-12-19 DIAGNOSIS — Z794 Long term (current) use of insulin: Secondary | ICD-10-CM

## 2023-12-19 DIAGNOSIS — N183 Chronic kidney disease, stage 3 unspecified: Secondary | ICD-10-CM

## 2023-12-19 DIAGNOSIS — D692 Other nonthrombocytopenic purpura: Secondary | ICD-10-CM | POA: Diagnosis not present

## 2023-12-19 DIAGNOSIS — Z1231 Encounter for screening mammogram for malignant neoplasm of breast: Secondary | ICD-10-CM | POA: Diagnosis not present

## 2023-12-19 DIAGNOSIS — Z0000 Encounter for general adult medical examination without abnormal findings: Secondary | ICD-10-CM

## 2023-12-19 DIAGNOSIS — E1121 Type 2 diabetes mellitus with diabetic nephropathy: Secondary | ICD-10-CM

## 2023-12-19 MED ORDER — POTASSIUM CHLORIDE CRYS ER 20 MEQ PO TBCR: 40.0000 meq | EXTENDED_RELEASE_TABLET | Freq: Two times a day (BID) | ORAL | 1 refills | Status: DC

## 2023-12-19 MED ORDER — TOUJEO MAX SOLOSTAR 300 UNIT/ML ~~LOC~~ SOPN: 16.0000 [IU] | PEN_INJECTOR | Freq: Every day | SUBCUTANEOUS | 1 refills | Status: DC

## 2023-12-19 MED ORDER — PANTOPRAZOLE SODIUM 40 MG PO TBEC: 40.0000 mg | DELAYED_RELEASE_TABLET | Freq: Two times a day (BID) | ORAL | 3 refills | Status: AC

## 2023-12-19 MED ORDER — SPIRONOLACTONE 50 MG PO TABS: 50.0000 mg | ORAL_TABLET | Freq: Every day | ORAL | 1 refills | Status: DC

## 2023-12-19 MED ORDER — TORSEMIDE 20 MG PO TABS: ORAL_TABLET | ORAL | 1 refills | Status: DC

## 2023-12-19 MED ORDER — APIXABAN 5 MG PO TABS: 5.0000 mg | ORAL_TABLET | Freq: Two times a day (BID) | ORAL | 3 refills | Status: DC

## 2023-12-19 MED ORDER — RYBELSUS 7 MG PO TABS: 7.0000 mg | ORAL_TABLET | Freq: Every day | ORAL | 1 refills | Status: DC

## 2023-12-19 MED ORDER — EMPAGLIFLOZIN 25 MG PO TABS: ORAL_TABLET | ORAL | 1 refills | Status: DC

## 2023-12-19 MED ORDER — METOPROLOL SUCCINATE ER 25 MG PO TB24: 12.5000 mg | ORAL_TABLET | Freq: Every day | ORAL | 1 refills | Status: DC

## 2023-12-19 MED ORDER — ALLOPURINOL 100 MG PO TABS: 100.0000 mg | ORAL_TABLET | Freq: Every day | ORAL | 1 refills | Status: AC

## 2023-12-19 NOTE — Patient Instructions (Addendum)
Please call to schedule your mammogram and bone density: [facility]  Address: [address], [place], [place] [address] Phone: [phone]  [facility] [address] [place],  [place]  [address] Phone: [phone]

## 2023-12-19 NOTE — Progress Notes (Signed)
 BP 107/72 (BP Location: Left Arm, Patient Position: Sitting, Cuff Size: Normal)   Pulse 86   Temp 98.6 F (37 C) (Oral)   Resp 15   Wt 152 lb (68.9 kg)   SpO2 97%   BMI 28.72 kg/m    Subjective:    Patient ID: Karen Farley, female    DOB: February 16, 1946, 78 y.o.   MRN: [id number]  HPI: Karen Farley is a 78 y.o. female presenting on 12/19/2023 for comprehensive medical examination. Current medical complaints include:  End of January she was weighing 142 and now she's up about 10lbs. This was a slow increase. Took 2 toresmide this AM. Took it last week and it helped with the weight a little bit.   DIABETES Hypoglycemic episodes:no Polydipsia/polyuria: no Visual disturbance: no Chest pain: no Paresthesias: no Glucose Monitoring: no Taking Insulin?: yes Blood Pressure Monitoring: rarely Retinal Examination: Up to Date Foot Exam: Up to Date Diabetic Education: Completed Pneumovax: Up to Date Influenza: Up to Date Aspirin: no  No gout flares.  HYPERTENSION / HYPERLIPIDEMIA Satisfied with current treatment? yes Duration of hypertension: chronic BP monitoring frequency: not checking BP medication side effects: no Past BP meds: metolazone, metoprolol, spironalactone, torsemide Duration of hyperlipidemia: chronic Cholesterol medication side effects: no Cholesterol supplements: none Past cholesterol medications: crestor Medication compliance: excellent compliance Aspirin: no Recent stressors: no Recurrent headaches: no Visual changes: no Palpitations: no Dyspnea: no Chest pain: no Lower extremity edema: no Dizzy/lightheaded: no  She currently lives with: husband Menopausal Symptoms: no  Depression Screen done today and results listed below:     12/19/2023    3:40 PM 09/16/2023    4:12 PM 09/02/2023    1:09 PM 05/30/2023    1:37 PM 12/13/2022    1:08 PM  Depression screen PHQ 2/9  Decreased Interest 0 0 0 0 0  Down, Depressed, Hopeless 0 0 0 0 0  PHQ -  2 Score 0 0 0 0 0  Altered sleeping 0 0 1 1 0  Tired, decreased energy 0 0 0 0 0  Change in appetite 0 0 0 0 0  Feeling bad or failure about yourself  0 0 0 0 0  Trouble concentrating 0 0 0 0 0  Moving slowly or fidgety/restless 0 0 0 0 0  Suicidal thoughts  0 0 0 0  PHQ-9 Score 0 0 1 1 0  Difficult doing work/chores  Not difficult at all Not difficult at all Not difficult at all Not difficult at all    Past Medical History:  Past Medical History:  Diagnosis Date   A-fib [facility])    CHF (congestive heart failure) (HCC)    Chronic heart failure with preserved ejection fraction (HFpEF) (HCC)    a. 11/2019 Echo: EF 60-65%; b. 07/2021 Echo: EF 60-65%.   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 07/2021 req 2u prbcs. Eval deferred given resolution of bleeding and cardiac issues.   Hyperlipidemia    Hypertension    Lymphedema    Morbid obesity (HCC)    Osteoarthritis of both knees    Osteopenia    Pericardial effusion    a. 07/2021 Echo: Nl EF. Large pericardial effusion-->s/p pericardiocentesis of ; b. 08/23/2021 Echo: EF 60-65%, mild LVH, Nl  RV fxn, sev dil LA, moderate pericardial effusion. L pleural effusion.   Permanent atrial fibrillation (HCC)    Pleural effusion, left    a. 07/2021 s/p thoracentesis - .    Surgical History:  Past Surgical History:  Procedure Laterality Date   BASAL CELL CARCINOMA EXCISION  11/01/2022   CATARACT EXTRACTION W/PHACO Left 10/09/2021   Procedure: CATARACT EXTRACTION PHACO AND INTRAOCULAR LENS PLACEMENT (IOC) LEFT DIABETIC 4.84 00:37.8;  Surgeon: Galen Manila, MD;  Location: [facility];  Service: Ophthalmology;  Laterality: Left;  Diabetic   CATARACT EXTRACTION W/PHACO Right 10/30/2021   Procedure: CATARACT EXTRACTION PHACO  AND INTRAOCULAR LENS PLACEMENT (IOC) RIGHT DIABETIC 5.65 00:38.5;  Surgeon: Galen Manila, MD;  Location: [facility];  Service: Ophthalmology;  Laterality: Right;  Diabetic   CHOLECYSTECTOMY     DILATION AND CURETTAGE OF UTERUS     IR FLUORO GUIDE CV LINE RIGHT  08/16/2021   IR THORACENTESIS ASP PLEURAL SPACE W/IMG GUIDE  08/16/2021   PERICARDIOCENTESIS N/A 08/05/2021   Procedure: PERICARDIOCENTESIS;  Surgeon: Marcina Millard, MD;  Location: [facility];  Service: Cardiovascular;  Laterality: N/A;   TEAR DUCT PROBING WITH STRABISMUS REPAIR Right    TONSILLECTOMY      Medications:  Current Outpatient Medications on File Prior to Visit  Medication Sig   Continuous Glucose Sensor (DEXCOM G7 SENSOR) MISC by Does not apply route.   folic acid (FOLVITE) 1 MG tablet Take 1 tablet (1 mg total) by mouth daily.   Glucagon (GVOKE HYPOPEN 2-PACK) 0.5 MG/0.1ML SOAJ Inject 1 each into the skin daily as needed.   Insulin Pen Needle (PEN NEEDLES) 32G X 4 MM MISC 1 each by Does not apply route 3 (three) times daily.   Insulin Pen Needle (ULTRACARE PEN NEEDLES) 32G X 4 MM MISC USE 1 PEN ONCE DAILY   Insulin Syringe-Needle U-100 (INSULIN SYRINGE 1CC/31GX5/16") 31G X 5/16" 1 ML MISC 1 each by Does not apply route 3 (three) times daily.   Insulin Syringe/Needle U-500 (BD INSULIN SYRINGE U-500) 31G X 0.5 ML MISC 1 each by Does not apply route 3 (three) times daily as needed.   latanoprost (XALATAN) 0.005 % ophthalmic solution Place 1 drop into both eyes at bedtime.   metroNIDAZOLE (METROGEL) 1 % gel Apply to face every morning.   NEEDLE, DISP, 30 G (BD DISP NEEDLES) 30G X 1/2" MISC by Does not apply route.   rosuvastatin (CRESTOR) 5 MG tablet Take 1 tablet (5 mg total) by mouth once a week.   metolazone (ZAROXOLYN) 2.5 MG tablet TAKE 1 TABLET BY MOUTH EVERY MONDAY WEDNESDAY  AND FRIDAY (Patient not taking: Reported on 12/19/2023)   midodrine (PROAMATINE) 10 MG tablet TAKE 1 TABLET  BY MOUTH 3 TIMES DAILY WITH MEALS (Patient not taking: Reported on 12/19/2023)   No current facility-administered medications on file prior to visit.    Allergies:  No Known Allergies  Social History:  Social History   Socioeconomic History   Marital status: Married    Spouse name: Not on file   Number of children: Not on file   Years of education: Not on file   Highest education level: Not on file  Occupational History   Not on file  Tobacco Use   Smoking status: Never   Smokeless tobacco: Never  Vaping Use   Vaping status: Never Used  Substance and Sexual Activity   Alcohol use: Not Currently    Alcohol/week: 0.0 standard drinks of alcohol  Comment: on rare occasion   Drug use: No   Sexual activity: Not Currently  Other Topics Concern   Not on file  Social History Narrative   Not on file   Social Drivers of Health   Financial Resource Strain: Low Risk  (07/23/2022)   Overall Financial Resource Strain (CARDIA)    Difficulty of Paying Living Expenses: Not hard at all  Food Insecurity: No Food Insecurity (11/26/2022)   Hunger Vital Sign    Worried About Running Out of Food in the Last Year: Never true    Ran Out of Food in the Last Year: Never true  Transportation Needs: No Transportation Needs (11/26/2022)   PRAPARE - Administrator, Civil Service (Medical): No    Lack of Transportation (Non-Medical): No  Physical Activity: Sufficiently Active (07/23/2022)   Exercise Vital Sign    Days of Exercise per Week: 3 days    Minutes of Exercise per Session: 60 min  Stress: No Stress Concern Present (07/23/2022)   [employer] of Occupational Health - Occupational Stress Questionnaire    Feeling of Stress : Not at all  Social Connections: Moderately Integrated (07/23/2022)   Social Connection and Isolation Panel [NHANES]    Frequency of Communication with Friends and Family: Twice a week    Frequency of Social Gatherings with Friends and Family: Once a week     Attends Religious Services: Never    Database administrator or Organizations: Yes    Attends Engineer, structural: More than 4 times per year    Marital Status: Married  Catering manager Violence: Not At Risk (07/23/2022)   Humiliation, Afraid, Rape, and Kick questionnaire    Fear of Current or Ex-Partner: No    Emotionally Abused: No    Physically Abused: No    Sexually Abused: No   Social History   Tobacco Use  Smoking Status Never  Smokeless Tobacco Never   Social History   Substance and Sexual Activity  Alcohol Use Not Currently   Alcohol/week: 0.0 standard drinks of alcohol   Comment: on rare occasion    Family History:  Family History  Problem Relation Age of Onset   Heart disease Mother        CHF   Osteoporosis Mother    Breast cancer Mother    Heart disease Father 53   Cancer Brother        Colon CA- 2002- Youngest brother   Parkinson's disease Brother        Younger Brother   Heart disease Brother        2 brothers   Diabetes Brother    Cancer Brother    Breast cancer Maternal Grandmother     Past medical history, surgical history, medications, allergies, family history and social history reviewed with patient today and changes made to appropriate areas of the chart.   Review of Systems  Constitutional: Negative.   HENT:  Positive for hearing loss. Negative for congestion, ear discharge, ear pain, nosebleeds, sinus pain, sore throat and tinnitus.   Eyes: Negative.   Respiratory: Negative.  Negative for stridor.   Cardiovascular: Negative.   Gastrointestinal:  Positive for constipation. Negative for abdominal pain, blood in stool, diarrhea, heartburn, melena, nausea and vomiting.  Musculoskeletal:  Positive for back pain and joint pain. Negative for falls, myalgias and neck pain.  Skin: Negative.   Neurological: Negative.   Endo/Heme/Allergies:  Negative for environmental allergies and polydipsia. Bruises/bleeds easily.   Psychiatric/Behavioral: Negative.  All other ROS negative except what is listed above and in the HPI.      Objective:    BP 107/72 (BP Location: Left Arm, Patient Position: Sitting, Cuff Size: Normal)   Pulse 86   Temp 98.6 F (37 C) (Oral)   Resp 15   Wt 152 lb (68.9 kg)   SpO2 97%   BMI 28.72 kg/m   Wt Readings from Last 3 Encounters:  12/19/23 152 lb (68.9 kg)  09/30/23 148 lb 3.2 oz (67.2 kg)  09/16/23 149 lb 3.2 oz (67.7 kg)    Physical Exam Vitals and nursing note reviewed.  Constitutional:      General: She is not in acute distress.    Appearance: Normal appearance. She is not ill-appearing, toxic-appearing or diaphoretic.  HENT:     Head: Normocephalic and atraumatic.     Right Ear: Tympanic membrane, ear canal and external ear normal. There is no impacted cerumen.     Left Ear: Tympanic membrane, ear canal and external ear normal. There is no impacted cerumen.     Nose: Nose normal. No congestion or rhinorrhea.     Mouth/Throat:     Mouth: Mucous membranes are moist.     Pharynx: Oropharynx is clear. No oropharyngeal exudate or posterior oropharyngeal erythema.  Eyes:     General: No scleral icterus.       Right eye: No discharge.        Left eye: No discharge.     Extraocular Movements: Extraocular movements intact.     Conjunctiva/sclera: Conjunctivae normal.     Pupils: Pupils are equal, round, and reactive to light.  Neck:     Vascular: No carotid bruit.  Cardiovascular:     Rate and Rhythm: Normal rate and regular rhythm.     Pulses: Normal pulses.     Heart sounds: No murmur heard.    No friction rub. No gallop.  Pulmonary:     Effort: Pulmonary effort is normal. No respiratory distress.     Breath sounds: Normal breath sounds. No stridor. No wheezing, rhonchi or rales.  Chest:     Chest wall: No tenderness.  Abdominal:     General: Abdomen is flat. Bowel sounds are normal. There is no distension.     Palpations: Abdomen is soft. There is no  mass.     Tenderness: There is no abdominal tenderness. There is no right CVA tenderness, left CVA tenderness, guarding or rebound.     Hernia: No hernia is present.  Genitourinary:    Comments: Breast and pelvic exams deferred with shared decision making Musculoskeletal:        General: No swelling, tenderness, deformity or signs of injury.     Cervical back: Normal range of motion and neck supple. No rigidity. No muscular tenderness.     Right lower leg: No edema.     Left lower leg: No edema.  Lymphadenopathy:     Cervical: No cervical adenopathy.  Skin:    General: Skin is warm and dry.     Capillary Refill: Capillary refill takes less than 2 seconds.     Coloration: Skin is not jaundiced or pale.     Findings: No bruising, erythema, lesion or rash.  Neurological:     General: No focal deficit present.     Mental Status: She is alert and oriented to person, place, and time. Mental status is at baseline.     Cranial Nerves: No cranial nerve deficit.  Sensory: No sensory deficit.     Motor: No weakness.     Coordination: Coordination normal.     Gait: Gait normal.     Deep Tendon Reflexes: Reflexes normal.  Psychiatric:        Mood and Affect: Mood normal.        Behavior: Behavior normal.        Thought Content: Thought content normal.        Judgment: Judgment normal.     Results for orders placed or performed in visit on 12/19/23  Uric acid   Collection Time: 12/19/23  4:24 PM  Result Value Ref Range   Uric Acid 4.6 3.1 - 7.9 mg/dL  CBC with Differential/Platelet   Collection Time: 12/19/23  4:24 PM  Result Value Ref Range   WBC 8.0 3.4 - 10.8 x10E3/uL   RBC 5.90 (H) 3.77 - 5.28 x10E6/uL   Hemoglobin 14.6 11.1 - 15.9 g/dL   Hematocrit 40.9 81.1 - 46.6 %   MCV 79 79 - 97 fL   MCH 24.7 (L) 26.6 - 33.0 pg   MCHC 31.4 (L) 31.5 - 35.7 g/dL   RDW 91.4 (H) 78.2 - 95.6 %   Platelets 388 150 - 450 x10E3/uL   Neutrophils 84 Not Estab. %   Lymphs 6 Not Estab. %    Monocytes 6 Not Estab. %   Eos 2 Not Estab. %   Basos 1 Not Estab. %   Neutrophils Absolute 6.7 1.4 - 7.0 x10E3/uL   Lymphocytes Absolute 0.5 (L) 0.7 - 3.1 x10E3/uL   Monocytes Absolute 0.5 0.1 - 0.9 x10E3/uL   EOS (ABSOLUTE) 0.2 0.0 - 0.4 x10E3/uL   Basophils Absolute 0.1 0.0 - 0.2 x10E3/uL   Immature Granulocytes 1 Not Estab. %   Immature Grans (Abs) 0.0 0.0 - 0.1 x10E3/uL  Comprehensive metabolic panel   Collection Time: 12/19/23  4:24 PM  Result Value Ref Range   Glucose 138 (H) 70 - 99 mg/dL   BUN 40 (H) 8 - 27 mg/dL   Creatinine, Ser 2.13 (H) 0.57 - 1.00 mg/dL   eGFR 25 (L) >08 MV/HQI/6.96   BUN/Creatinine Ratio 20 12 - 28   Sodium 133 (L) 134 - 144 mmol/L   Potassium 4.9 3.5 - 5.2 mmol/L   Chloride 97 96 - 106 mmol/L   CO2 21 20 - 29 mmol/L   Calcium 9.7 8.7 - 10.3 mg/dL   Total Protein 6.1 6.0 - 8.5 g/dL   Albumin 4.1 3.8 - 4.8 g/dL   Globulin, Total 2.0 1.5 - 4.5 g/dL   Bilirubin Total 0.8 0.0 - 1.2 mg/dL   Alkaline Phosphatase 158 (H) 44 - 121 IU/L   AST 18 0 - 40 IU/L   ALT 19 0 - 32 IU/L  Lipid Panel w/o Chol/HDL Ratio   Collection Time: 12/19/23  4:24 PM  Result Value Ref Range   Cholesterol, Total 176 100 - 199 mg/dL   Triglycerides 61 0 - 149 mg/dL   HDL 53 >29 mg/dL   VLDL Cholesterol Cal 12 5 - 40 mg/dL   LDL Chol Calc (NIH) 528 (H) 0 - 99 mg/dL  TSH   Collection Time: 12/19/23  4:24 PM  Result Value Ref Range   TSH 1.900 0.450 - 4.500 uIU/mL  B Nat Peptide   Collection Time: 12/19/23  4:24 PM  Result Value Ref Range   BNP 132.4 (H) 0.0 - 100.0 pg/mL      Assessment & Plan:   Problem List Items  Addressed This Visit       Cardiovascular and Mediastinum   Acute on chronic diastolic CHF (congestive heart failure) (HCC)   Will check BNP- has been taken off her metolazone by nephrology due to kidney function. Given weight issues- may need to restart. Call with any concerns.      Relevant Medications   apixaban (ELIQUIS) 5 MG TABS tablet    metoprolol succinate (TOPROL-XL) 25 MG 24 hr tablet   spironolactone (ALDACTONE) 50 MG tablet   torsemide (DEMADEX) 20 MG tablet   Other Relevant Orders   B Nat Peptide (Completed)   Senile purpura (HCC)   Relevant Medications   apixaban (ELIQUIS) 5 MG TABS tablet   metoprolol succinate (TOPROL-XL) 25 MG 24 hr tablet   spironolactone (ALDACTONE) 50 MG tablet   torsemide (DEMADEX) 20 MG tablet   Other Relevant Orders   CBC with Differential/Platelet (Completed)   Comprehensive metabolic panel (Completed)     Endocrine   Type 2 diabetes mellitus with renal complication (HCC)   Up slightly. Will continue current regimen. Recheck 3 months. Call with any concerns.       Relevant Medications   empagliflozin (JARDIANCE) 25 MG TABS tablet   insulin glargine, 2 Unit Dial, (TOUJEO MAX SOLOSTAR) 300 UNIT/ML Solostar Pen   Semaglutide (RYBELSUS) 7 MG TABS   Other Relevant Orders   Microalbumin, Urine Waived   Bayer DCA Hb A1c Waived   CBC with Differential/Platelet (Completed)   Comprehensive metabolic panel (Completed)   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 diabetes mellitus (HCC)   Rechecking labs today. Await results. Treat as needed.       Relevant Medications   empagliflozin (JARDIANCE) 25 MG TABS tablet   insulin glargine, 2 Unit Dial, (TOUJEO MAX SOLOSTAR) 300 UNIT/ML Solostar Pen   Semaglutide (RYBELSUS) 7 MG TABS   Other Relevant Orders   CBC with Differential/Platelet (Completed)   Comprehensive metabolic panel (Completed)     Musculoskeletal and Integument   Acute idiopathic gout involving toe   Relevant Medications   allopurinol (ZYLOPRIM) 100 MG tablet   Other Relevant Orders   Uric acid (Completed)   CBC with Differential/Platelet (Completed)   Comprehensive metabolic panel (Completed)     Genitourinary   Benign hypertensive renal disease   Under good control on current regimen. Continue current regimen. Continue to monitor. Call with any concerns. Refills given. Labs drawn  today.        Relevant Orders   Microalbumin, Urine Waived   CBC with Differential/Platelet (Completed)   Comprehensive metabolic panel (Completed)   TSH (Completed)     Other   Hyperlipidemia   Under good control on current regimen. Continue current regimen. Continue to monitor. Call with any concerns. Refills given. Labs drawn today.       Relevant Medications   apixaban (ELIQUIS) 5 MG TABS tablet   metoprolol succinate (TOPROL-XL) 25 MG 24 hr tablet   spironolactone (ALDACTONE) 50 MG tablet   torsemide (DEMADEX) 20 MG tablet   Other Relevant Orders   CBC with Differential/Platelet (Completed)   Comprehensive metabolic panel (Completed)   Lipid Panel w/o Chol/HDL Ratio (Completed)   Other Visit Diagnoses       Routine general medical examination at a health care facility    -  Primary   Vaccines up to date. Screening labs checked today. Mammo and DEXA ordered. Continue diet and exercise. Call with any concerns.     Postmenopausal estrogen deficiency  DEXA ordered today.   Relevant Orders   DG Bone Density     Encounter for screening mammogram for malignant neoplasm of breast       Mammo ordered today.        Follow up plan: Return in about 3 months (around 03/17/2024).   LABORATORY TESTING:  - Pap smear: not applicable  IMMUNIZATIONS:   - Tdap: Tetanus vaccination status reviewed: last tetanus booster within 10 years. - Influenza: Up to date - Pneumovax: Up to date - Prevnar: Up to date - COVID: Up to date - HPV: Not applicable - Shingrix vaccine: Refused  SCREENING: -Mammogram: Up to date  - Colonoscopy: Not applicable  - Bone Density: Ordered today   PATIENT COUNSELING:   Advised to take 1 mg of folate supplement per day if capable of pregnancy.   Sexuality: Discussed sexually transmitted diseases, partner selection, use of condoms, avoidance of unintended pregnancy  and contraceptive alternatives.   Advised to avoid cigarette smoking.  I  discussed with the patient that most people either abstain from alcohol or drink within safe limits (<=14/week and <=4 drinks/occasion for males, <=7/weeks and <= 3 drinks/occasion for females) and that the risk for alcohol disorders and other health effects rises proportionally with the number of drinks per week and how often a drinker exceeds daily limits.  Discussed cessation/primary prevention of drug use and availability of treatment for abuse.   Diet: Encouraged to adjust caloric intake to maintain  or achieve ideal body weight, to reduce intake of dietary saturated fat and total fat, to limit sodium intake by avoiding high sodium foods and not adding table salt, and to maintain adequate dietary potassium and calcium preferably from fresh fruits, vegetables, and low-fat dairy products.    stressed the importance of regular exercise  Injury prevention: Discussed safety belts, safety helmets, smoke detector, smoking near bedding or upholstery.   Dental health: Discussed importance of regular tooth brushing, flossing, and dental visits.    NEXT PREVENTATIVE PHYSICAL DUE IN 1 YEAR. Return in about 3 months (around 03/17/2024).

## 2023-12-22 ENCOUNTER — Other Ambulatory Visit: Payer: Self-pay | Admitting: Family Medicine

## 2023-12-22 DIAGNOSIS — N63 Unspecified lump in unspecified breast: Secondary | ICD-10-CM

## 2023-12-22 LAB — LIPID PANEL W/O CHOL/HDL RATIO
Cholesterol, Total: 176 mg/dL (ref 100–199)
HDL: 53 mg/dL (ref >39)
LDL Chol Calc (NIH): 111 mg/dL — ABNORMAL HIGH (ref 0–99)
Triglycerides: 61 mg/dL (ref 0–149)
VLDL Cholesterol Cal: 12 mg/dL (ref 5–40)

## 2023-12-22 LAB — COMPREHENSIVE METABOLIC PANEL
ALT: 19 IU/L (ref 0–32)
AST: 18 IU/L (ref 0–40)
Albumin: 4.1 g/dL (ref 3.8–4.8)
Alkaline Phosphatase: 158 IU/L — ABNORMAL HIGH (ref 44–121)
BUN/Creatinine Ratio: 20 (ref 12–28)
BUN: 40 mg/dL — ABNORMAL HIGH (ref 8–27)
Bilirubin Total: 0.8 mg/dL (ref 0.0–1.2)
CO2: 21 mmol/L (ref 20–29)
Calcium: 9.7 mg/dL (ref 8.7–10.3)
Chloride: 97 mmol/L (ref 96–106)
Creatinine, Ser: 1.99 mg/dL — ABNORMAL HIGH (ref 0.57–1.00)
Globulin, Total: 2 g/dL (ref 1.5–4.5)
Glucose: 138 mg/dL — ABNORMAL HIGH (ref 70–99)
Potassium: 4.9 mmol/L (ref 3.5–5.2)
Sodium: 133 mmol/L — ABNORMAL LOW (ref 134–144)
Total Protein: 6.1 g/dL (ref 6.0–8.5)
eGFR: 25 mL/min/{1.73_m2} — ABNORMAL LOW (ref >59)

## 2023-12-22 LAB — CBC WITH DIFFERENTIAL/PLATELET
Basophils Absolute: 0.1 10*3/uL (ref 0.0–0.2)
Basos: 1 %
EOS (ABSOLUTE): 0.2 10*3/uL (ref 0.0–0.4)
Eos: 2 %
Hematocrit: 46.5 % (ref 34.0–46.6)
Hemoglobin: 14.6 g/dL (ref 11.1–15.9)
Immature Grans (Abs): 0 10*3/uL (ref 0.0–0.1)
Immature Granulocytes: 1 %
Lymphocytes Absolute: 0.5 10*3/uL — ABNORMAL LOW (ref 0.7–3.1)
Lymphs: 6 %
MCH: 24.7 pg — ABNORMAL LOW (ref 26.6–33.0)
MCHC: 31.4 g/dL — ABNORMAL LOW (ref 31.5–35.7)
MCV: 79 fL (ref 79–97)
Monocytes Absolute: 0.5 10*3/uL (ref 0.1–0.9)
Monocytes: 6 %
Neutrophils Absolute: 6.7 10*3/uL (ref 1.4–7.0)
Neutrophils: 84 %
Platelets: 388 10*3/uL (ref 150–450)
RBC: 5.9 x10E6/uL — ABNORMAL HIGH (ref 3.77–5.28)
RDW: 16.5 % — ABNORMAL HIGH (ref 11.7–15.4)
WBC: 8 10*3/uL (ref 3.4–10.8)

## 2023-12-22 LAB — TSH: TSH: 1.9 u[IU]/mL (ref 0.450–4.500)

## 2023-12-22 LAB — BRAIN NATRIURETIC PEPTIDE: BNP: 132.4 pg/mL — ABNORMAL HIGH (ref 0.0–100.0)

## 2023-12-22 LAB — URIC ACID: Uric Acid: 4.6 mg/dL (ref 3.1–7.9)

## 2023-12-22 NOTE — Assessment & Plan Note (Signed)
 Under good control on current regimen. Continue current regimen. Continue to monitor. Call with any concerns. Refills given. Labs drawn today.

## 2023-12-22 NOTE — Assessment & Plan Note (Signed)
 Will check BNP- has been taken off her metolazone by nephrology due to kidney function. Given weight issues- may need to restart. Call with any concerns.

## 2023-12-22 NOTE — Assessment & Plan Note (Signed)
 Up slightly. Will continue current regimen. Recheck 3 months. Call with any concerns.

## 2023-12-22 NOTE — Assessment & Plan Note (Signed)
 Rechecking labs today. Await results. Treat as needed.

## 2023-12-23 LAB — MICROALBUMIN, URINE WAIVED
Creatinine, Urine Waived: 50 mg/dL (ref 10–300)
Microalb, Ur Waived: 10 mg/L (ref 0–19)

## 2023-12-23 LAB — BAYER DCA HB A1C WAIVED: HB A1C (BAYER DCA - WAIVED): 7.4 % — ABNORMAL HIGH (ref 4.8–5.6)

## 2024-02-04 ENCOUNTER — Ambulatory Visit
Admission: RE | Admit: 2024-02-04 | Discharge: 2024-02-04 | Disposition: A | Discharge status: Home / Self Care | Source: Ambulatory Visit | Attending: Family Medicine | Admitting: Family Medicine

## 2024-02-04 DIAGNOSIS — N63 Unspecified lump in unspecified breast: Secondary | ICD-10-CM | POA: Insufficient documentation

## 2024-02-04 DIAGNOSIS — N6323 Unspecified lump in the left breast, lower outer quadrant: Secondary | ICD-10-CM | POA: Diagnosis not present

## 2024-02-04 DIAGNOSIS — Z780 Asymptomatic menopausal state: Secondary | ICD-10-CM | POA: Diagnosis present

## 2024-02-04 DIAGNOSIS — Z13820 Encounter for screening for osteoporosis: Secondary | ICD-10-CM | POA: Diagnosis present

## 2024-02-04 DIAGNOSIS — M8589 Other specified disorders of bone density and structure, multiple sites: Secondary | ICD-10-CM | POA: Insufficient documentation

## 2024-02-10 ENCOUNTER — Encounter: Payer: Self-pay | Admitting: Family Medicine

## 2024-02-23 NOTE — Progress Notes (Unsigned)
 Cardiology Office Note  Date:  02/24/2024   ID:  Karen Farley, DOB 04-05-1946, MRN [id number]  PCP:  Solomon Dupre, DO   Chief Complaint  Patient presents with   12 month follow up     Patient c/o abdominal swelling, bilateral LE edema and shortness of breath.     HPI:  78 year old female with a history of  HFpEF,  hypertension,  stage III chronic kidney disease,  lymphedema,  permanent atrial fibrillation,  hyperlipidemia,  diabetes,  pericardial effusion status post pericardiocentesis,  pleural effusion status post thoracentesis, and  GI bleed,  who presents for follow-up of heart failure, permament atrial fibrillation  Last seen in clinic by myself 5/24  Followed by nephrology, Dr. Zelda Hickman Followed by Dr. Terre Ferri Diabetic nurse at [facility]  Using lymphedema pumps 2 hrs a day  Seen by Dr. Zelda Hickman, reports that she was told to stop metolazone  and midodrine  Since then weight has trended up 15 pounds per her report Typically weight 145 pounds, she reached 160 pounds Abdomen swollen, leg swelling, short of breath Was told to restart metolazone  once a week with her torsemide  20 daily Weight stayed high, SOB, ABD still swollen Reports having leg cramping, drinks more water for this  Labs reviewed A1C 7.9 Total chol 232 LDL 157 LDL 176, up from 133 CR 2.58 up from , BUN 62  Walking with a walker  EKG personally reviewed by myself on todays visit EKG Interpretation Date/Time:  Tuesday Feb 24 2024 16:29:25 EDT Ventricular Rate:  81 PR Interval:    QRS Duration:  72 QT Interval:  364 QTC Calculation: 422 R Axis:   18  Text Interpretation: Atrial fibrillation Septal infarct (cited on or before 18-Dec-2021) When compared with ECG of 18-Dec-2021 13:31, No significant change was found Confirmed by Belva Boyden [phone]) on 02/24/2024 4:36:15 PM    Prior cardiac history reviewed Admitted to the hospital October 2022, large left pleural effusion requiring  thoracentesis Rectal bleed, Eliquis  discontinued, required transfusion Large pericardial effusion noted, no tamponade on echo Pericardiocentesis performed 700 Follow-up echo stable moderate effusion Required diuresis, transition to torsemide  metolazone , Eliquis  resumed  PMH:   has a past medical history of A-fib (HCC), CHF (congestive heart failure) (HCC), Chronic heart failure with preserved ejection fraction (HFpEF) (HCC), 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  PSH:    Past Surgical History:  Procedure Laterality Date   BASAL CELL CARCINOMA EXCISION  11/01/2022   CATARACT EXTRACTION W/PHACO Left 10/09/2021   Procedure: CATARACT EXTRACTION PHACO AND INTRAOCULAR LENS PLACEMENT (IOC) LEFT DIABETIC 4.84 00:37.8;  Surgeon: Clair Crews, MD;  Location: [facility];  Service: Ophthalmology;  Laterality: Left;  Diabetic   CATARACT EXTRACTION W/PHACO Right 10/30/2021   Procedure: CATARACT EXTRACTION PHACO AND INTRAOCULAR LENS PLACEMENT (IOC) RIGHT DIABETIC 5.65 00:38.5;  Surgeon: Clair Crews, MD;  Location: [facility];  Service: Ophthalmology;  Laterality: Right;  Diabetic   CHOLECYSTECTOMY     DILATION AND CURETTAGE OF UTERUS     IR FLUORO GUIDE CV LINE RIGHT  08/16/2021   IR THORACENTESIS ASP PLEURAL SPACE W/IMG GUIDE  08/16/2021   PERICARDIOCENTESIS N/A 08/05/2021   Procedure: PERICARDIOCENTESIS;  Surgeon: Percival Brace, MD;  Location: [facility] INVASIVE CV LAB;  Service:  Cardiovascular;  Laterality: N/A;   TEAR DUCT PROBING WITH STRABISMUS REPAIR Right    TONSILLECTOMY      Current Outpatient Medications   Medication Sig Dispense Refill   allopurinol  (ZYLOPRIM ) 100 MG tablet Take 1 tablet (100 mg total) by mouth daily. 90 tablet 1   apixaban  (ELIQUIS ) 5 MG TABS tablet Take 1 tablet (5 mg total) by mouth 2 (two) times daily. 180 tablet 3   Continuous Glucose Sensor (DEXCOM G7 SENSOR) MISC by Does not apply route.     empagliflozin  (JARDIANCE ) 25 MG TABS tablet TAKE 1 TABLET BY MOUTH ONCE DAILY BEFOREBREAKFAST 90 tablet 1   folic acid  (FOLVITE ) 1 MG tablet Take 1 tablet (1 mg total) by mouth daily. 90 tablet 3   Glucagon  (GVOKE HYPOPEN  2-PACK) 0.5 MG/0.1ML SOAJ Inject 1 each into the skin daily as needed. 0.2 mL 12   insulin  glargine, 2 Unit Dial, (TOUJEO  MAX SOLOSTAR) 300 UNIT/ML Solostar Pen Inject 16-20 Units into the skin daily. 9 mL 1   latanoprost  (XALATAN ) 0.005 % ophthalmic solution Place 1 drop into both eyes at bedtime.     metolazone  (ZAROXOLYN ) 2.5 MG tablet Take 2.5 mg by mouth once a week. Wednesday     metoprolol  succinate (TOPROL -XL) 25 MG 24 hr tablet Take 0.5 tablets (12.5 mg total) by mouth daily. 90 tablet 1   metroNIDAZOLE (METROGEL) 1 % gel Apply to face every morning.     pantoprazole  (PROTONIX ) 40 MG tablet Take 1 tablet (40 mg total) by mouth 2 (two) times daily. 180 tablet 3   potassium chloride  SA (KLOR-CON  M) 20 MEQ tablet Take 2 tablets (40 mEq total) by mouth 2 (two) times daily. 360 tablet 1   rosuvastatin  (CRESTOR ) 5 MG tablet Take 1 tablet (5 mg total) by mouth once a week. 52 tablet 0   Semaglutide  (RYBELSUS ) 7 MG TABS Take 1 tablet (7 mg total) by mouth daily. 90 tablet 1   spironolactone  (ALDACTONE ) 50 MG tablet Take 1 tablet (50 mg total) by mouth daily. 90 tablet 1   torsemide  (DEMADEX ) 20 MG tablet TAKE 1 TO 2 TABLETS BY MOUTH DAILY. ONLY TAKE 2 TABLETS FOR 1 TO 3 DAYS AND IF PERSISTS, CALL WITH BAD SWELLING 180 tablet 1   Insulin  Pen Needle (PEN NEEDLES) 32G X 4 MM MISC 1 each by Does not apply route 3 (three) times daily. (Patient not taking: Reported on  02/24/2024) 1000 each 12   Insulin  Pen Needle (ULTRACARE PEN NEEDLES) 32G X 4 MM MISC USE 1 PEN ONCE DAILY (Patient not taking: Reported on 02/24/2024) 100 each 0   Insulin  Syringe-Needle U-100 (INSULIN  SYRINGE 1CC/31GX5/16") 31G X 5/16" 1 ML MISC 1 each by Does not apply route 3 (three) times daily. (Patient not taking: Reported on 02/24/2024) 100 each 12   Insulin  Syringe/Needle U-500 (BD INSULIN  SYRINGE U-500) 31G X 0.5 ML MISC 1 each by Does not apply route 3 (three) times daily as needed. (Patient not taking: Reported on 02/24/2024) 300 each 4   midodrine  (PROAMATINE ) 10 MG tablet TAKE 1 TABLET BY MOUTH 3 TIMES DAILY WITH MEALS (Patient not taking: Reported on 02/24/2024) 270 tablet 3   NEEDLE, DISP, 30 G (BD DISP NEEDLES) 30G X 1/2" MISC by Does not apply route. (Patient not taking: Reported on 02/24/2024)     No current facility-administered medications for this visit.    Allergies:   Patient has no known allergies.   Social History:  The patient  reports that she has  never smoked. She has never used smokeless tobacco. She reports that she does not currently use alcohol. She reports that she does not use drugs.   Family History:   family history includes Breast cancer in her maternal grandmother and mother; Cancer in her brother and brother; Diabetes in her brother; Heart disease in her brother and mother; Heart disease (age of onset: 15) in her father; Osteoporosis in her mother; Parkinson's disease in her brother.   Review of Systems: Review of Systems  Constitutional: Negative.        Weight gain  HENT: Negative.    Respiratory:  Positive for shortness of breath.   Cardiovascular:  Positive for leg swelling.  Gastrointestinal: Negative.   Musculoskeletal: Negative.   Neurological: Negative.   Psychiatric/Behavioral: Negative.    All other systems reviewed and are negative.  PHYSICAL EXAM: VS:  BP 100/80 (BP Location: Left Arm, Patient Position: Sitting, Cuff Size: Normal)   Pulse 81    Ht 5' (1.524 m)   Wt 157 lb (71.2 kg)   SpO2 98%   BMI 30.66 kg/m  , BMI Body mass index is 30.66 kg/m. Constitutional: Obese, oriented to person, place, and time. No distress.  HENT:  Head: Grossly normal Eyes:  no discharge. No scleral icterus.  Neck: No JVD, no carotid bruits  Cardiovascular: Regular rate and rhythm, no murmurs appreciated, trace bilateral lower extremity edema with compression hose in place Pulmonary/Chest: Clear to auscultation bilaterally, no wheezes or rales, dullness at the bases Abdominal: Soft.  no distension.  no tenderness.  Musculoskeletal: Normal range of motion Neurological:  normal muscle tone. Coordination normal. No atrophy Skin: Skin warm and dry Psychiatric: normal affect, pleasant  Recent Labs: 12/19/2023: ALT 19; BNP 132.4; BUN 40; Creatinine, Ser 1.99; Hemoglobin 14.6; Platelets 388; Potassium 4.9; Sodium 133; TSH 1.900    Lipid Panel Lab Results  Component Value Date   CHOL 176 12/19/2023   HDL 53 12/19/2023   LDLCALC 111 (H) 12/19/2023   TRIG 61 12/19/2023    Wt Readings from Last 3 Encounters:  02/24/24 157 lb (71.2 kg)  12/19/23 152 lb (68.9 kg)  09/30/23 148 lb 3.2 oz (67.2 kg)     ASSESSMENT AND PLAN:  Problem List Items Addressed This Visit       Cardiology Problems   Hyperlipidemia   Relevant Medications   metolazone  (ZAROXOLYN ) 2.5 MG tablet   Pulmonary hypertension, primary (HCC)   Relevant Medications   metolazone  (ZAROXOLYN ) 2.5 MG tablet   Pericardial effusion   Relevant Medications   metolazone  (ZAROXOLYN ) 2.5 MG tablet   PAD (peripheral artery disease) (HCC)   Relevant Medications   metolazone  (ZAROXOLYN ) 2.5 MG tablet   Atrial fibrillation (HCC)   Relevant Medications   metolazone  (ZAROXOLYN ) 2.5 MG tablet   Other Relevant Orders   EKG 12-Lead (Completed)     Other   Type 2 diabetes mellitus with renal complication (HCC)   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 diabetes mellitus (HCC)   Lymphedema   Other  Visit Diagnoses       Chronic heart failure with preserved ejection fraction (HCC)    -  Primary   Relevant Medications   metolazone  (ZAROXOLYN ) 2.5 MG tablet   Other Relevant Orders   EKG 12-Lead (Completed)     Essential hypertension       Relevant Medications   metolazone  (ZAROXOLYN ) 2.5 MG tablet   Other Relevant Orders   EKG 12-Lead (Completed)      Chronic  diastolic CHF -By her report weight up from 145 now running 157 -Recommend she increase torsemide  up to 20 twice daily, metolazone  2.5 mg 3 days a week - For weight less than 150 pounds, decrease metolazone  back to once weekly  Chronic renal failure Managed by nephrology, creatinine last week running 2.6, above her baseline Unable to exclude cardiorenal syndrome  Hypotension Continue midodrine  10 mg 3 times daily as needed for systolic pressure less than 100 Was previously taking this scheduled Recent episodes of hypotension  Anemia Hemoglobin stable  Permanent atrial fibrillation On anticoagulation, rate controlled on low-dose metoprolol   Diabetes type 2 poorly controlled A1C 7.4 Stable over the past 2 years    Signed, Juanda Noon, M.D., Ph.D. [facility] [place], [place] [phone]

## 2024-02-24 ENCOUNTER — Ambulatory Visit: Attending: Cardiovascular Disease | Admitting: Cardiovascular Disease

## 2024-02-24 VITALS — BP 100/80 | HR 81 | Ht 60.0 in | Wt 157.0 lb

## 2024-02-24 DIAGNOSIS — I10 Essential (primary) hypertension: Secondary | ICD-10-CM

## 2024-02-24 DIAGNOSIS — I890 Lymphedema, not elsewhere classified: Secondary | ICD-10-CM | POA: Diagnosis not present

## 2024-02-24 DIAGNOSIS — I270 Primary pulmonary hypertension: Secondary | ICD-10-CM

## 2024-02-24 DIAGNOSIS — Z794 Long term (current) use of insulin: Secondary | ICD-10-CM

## 2024-02-24 DIAGNOSIS — I5032 Chronic diastolic (congestive) heart failure: Secondary | ICD-10-CM

## 2024-02-24 DIAGNOSIS — E1122 Type 2 diabetes mellitus with diabetic chronic kidney disease: Secondary | ICD-10-CM

## 2024-02-24 DIAGNOSIS — N183 Chronic kidney disease, stage 3 unspecified: Secondary | ICD-10-CM

## 2024-02-24 DIAGNOSIS — I739 Peripheral vascular disease, unspecified: Secondary | ICD-10-CM

## 2024-02-24 DIAGNOSIS — I4821 Permanent atrial fibrillation: Secondary | ICD-10-CM | POA: Diagnosis not present

## 2024-02-24 DIAGNOSIS — R0602 Shortness of breath: Secondary | ICD-10-CM

## 2024-02-24 DIAGNOSIS — I3139 Other pericardial effusion (noninflammatory): Secondary | ICD-10-CM

## 2024-02-24 DIAGNOSIS — N184 Chronic kidney disease, stage 4 (severe): Secondary | ICD-10-CM

## 2024-02-24 DIAGNOSIS — E782 Mixed hyperlipidemia: Secondary | ICD-10-CM

## 2024-02-24 NOTE — Patient Instructions (Addendum)
 Medication Instructions:  Please increase the torsemide  up to 20 mg twice a day for weight greater than 150 pounds  Metolazone  2.5 mg three days a weeek for weight >150  Midodrine  as needed for pressure <100  If you need a refill on your cardiac medications before your next appointment, please call your pharmacy.   Lab work: No new labs needed  Testing/Procedures: Your physician has requested that you have an echocardiogram. Echocardiography is a painless test that uses sound waves to create images of your heart. It provides your doctor with information about the size and shape of your heart and how well your heart's chambers and valves are working.   You may receive an ultrasound enhancing agent through an IV if needed to better visualize your heart during the echo. This procedure takes approximately one hour.  There are no restrictions for this procedure.  This will take place at [address] [facility]) [address], [place] [address]  Please note: We ask at that you not bring children with you during ultrasound (echo/ vascular) testing. Due to room size and safety concerns, children are not allowed in the ultrasound rooms during exams. Our front office staff cannot provide observation of children in our lobby area while testing is being conducted. An adult accompanying a patient to their appointment will only be allowed in the ultrasound room at the discretion of the ultrasound technician under special circumstances. We apologize for any inconvenience.   Follow-Up: At [facility], you and your health needs are our priority.  As part of our continuing mission to provide you with exceptional heart care, we have created designated Provider Care Teams.  These Care Teams include your primary Cardiologist (physician) and Advanced Practice Providers (APPs -  Physician Assistants and Nurse Practitioners) who all work together to provide you with the care you need, when you need  it.  You will need a follow up appointment in 3 months  Providers on your designated Care Team:   Laneta Pintos, NP Varney Gentleman, PA-C Cadence Gennaro Khat, [place]  COVID-19 Vaccine Information can be found at: [email or web address] For questions related to vaccine distribution or appointments, please email [email or web address] or call [phone].

## 2024-02-25 ENCOUNTER — Telehealth: Payer: Self-pay | Admitting: Cardiovascular Disease

## 2024-02-25 NOTE — Telephone Encounter (Signed)
-----   Message from Nurse Aurora Blowers sent at 02/24/2024  5:33 PM EDT ----- Can you please schedule patient for echo and 3 month follow up?

## 2024-02-25 NOTE — Telephone Encounter (Signed)
 Called patient to schedule 3 month follow up, she states she will call back to schedule.

## 2024-03-19 ENCOUNTER — Emergency Department

## 2024-03-19 ENCOUNTER — Inpatient Hospital Stay
Admission: EM | Admit: 2024-03-19 | Discharge: 2024-04-03 | DRG: 286 | Disposition: A | Discharge status: Hospice | Attending: Internal Medicine | Admitting: Internal Medicine

## 2024-03-19 DIAGNOSIS — I130 Hypertensive heart and chronic kidney disease with heart failure and stage 1 through stage 4 chronic kidney disease, or unspecified chronic kidney disease: Secondary | ICD-10-CM | POA: Diagnosis not present

## 2024-03-19 DIAGNOSIS — I350 Nonrheumatic aortic (valve) stenosis: Secondary | ICD-10-CM | POA: Diagnosis present

## 2024-03-19 DIAGNOSIS — I3139 Other pericardial effusion (noninflammatory): Secondary | ICD-10-CM | POA: Diagnosis present

## 2024-03-19 DIAGNOSIS — Z794 Long term (current) use of insulin: Secondary | ICD-10-CM | POA: Diagnosis not present

## 2024-03-19 DIAGNOSIS — Z79899 Other long term (current) drug therapy: Secondary | ICD-10-CM

## 2024-03-19 DIAGNOSIS — R5381 Other malaise: Secondary | ICD-10-CM | POA: Diagnosis present

## 2024-03-19 DIAGNOSIS — E663 Overweight: Secondary | ICD-10-CM | POA: Diagnosis not present

## 2024-03-19 DIAGNOSIS — I4821 Permanent atrial fibrillation: Secondary | ICD-10-CM | POA: Diagnosis present

## 2024-03-19 DIAGNOSIS — Z515 Encounter for palliative care: Secondary | ICD-10-CM | POA: Diagnosis not present

## 2024-03-19 DIAGNOSIS — I5033 Acute on chronic diastolic (congestive) heart failure: Principal | ICD-10-CM | POA: Diagnosis present

## 2024-03-19 DIAGNOSIS — E871 Hypo-osmolality and hyponatremia: Secondary | ICD-10-CM | POA: Diagnosis present

## 2024-03-19 DIAGNOSIS — E1122 Type 2 diabetes mellitus with diabetic chronic kidney disease: Secondary | ICD-10-CM | POA: Diagnosis present

## 2024-03-19 DIAGNOSIS — I509 Heart failure, unspecified: Secondary | ICD-10-CM | POA: Diagnosis not present

## 2024-03-19 DIAGNOSIS — K767 Hepatorenal syndrome: Secondary | ICD-10-CM | POA: Diagnosis present

## 2024-03-19 DIAGNOSIS — E11649 Type 2 diabetes mellitus with hypoglycemia without coma: Secondary | ICD-10-CM | POA: Diagnosis not present

## 2024-03-19 DIAGNOSIS — E1165 Type 2 diabetes mellitus with hyperglycemia: Secondary | ICD-10-CM | POA: Diagnosis present

## 2024-03-19 DIAGNOSIS — E876 Hypokalemia: Secondary | ICD-10-CM | POA: Diagnosis not present

## 2024-03-19 DIAGNOSIS — I5082 Biventricular heart failure: Secondary | ICD-10-CM | POA: Diagnosis not present

## 2024-03-19 DIAGNOSIS — Z8249 Family history of ischemic heart disease and other diseases of the circulatory system: Secondary | ICD-10-CM

## 2024-03-19 DIAGNOSIS — I10 Essential (primary) hypertension: Secondary | ICD-10-CM | POA: Diagnosis present

## 2024-03-19 DIAGNOSIS — Z66 Do not resuscitate: Secondary | ICD-10-CM | POA: Diagnosis not present

## 2024-03-19 DIAGNOSIS — N184 Chronic kidney disease, stage 4 (severe): Secondary | ICD-10-CM | POA: Diagnosis present

## 2024-03-19 DIAGNOSIS — R570 Cardiogenic shock: Secondary | ICD-10-CM | POA: Diagnosis not present

## 2024-03-19 DIAGNOSIS — M109 Gout, unspecified: Secondary | ICD-10-CM | POA: Diagnosis present

## 2024-03-19 DIAGNOSIS — I050 Rheumatic mitral stenosis: Secondary | ICD-10-CM | POA: Diagnosis not present

## 2024-03-19 DIAGNOSIS — E875 Hyperkalemia: Secondary | ICD-10-CM | POA: Diagnosis not present

## 2024-03-19 DIAGNOSIS — D509 Iron deficiency anemia, unspecified: Secondary | ICD-10-CM | POA: Diagnosis present

## 2024-03-19 DIAGNOSIS — K2101 Gastro-esophageal reflux disease with esophagitis, with bleeding: Secondary | ICD-10-CM | POA: Diagnosis present

## 2024-03-19 DIAGNOSIS — D631 Anemia in chronic kidney disease: Secondary | ICD-10-CM | POA: Diagnosis present

## 2024-03-19 DIAGNOSIS — K7469 Other cirrhosis of liver: Secondary | ICD-10-CM | POA: Diagnosis not present

## 2024-03-19 DIAGNOSIS — I5A Non-ischemic myocardial injury (non-traumatic): Secondary | ICD-10-CM | POA: Diagnosis present

## 2024-03-19 DIAGNOSIS — I340 Nonrheumatic mitral (valve) insufficiency: Secondary | ICD-10-CM | POA: Diagnosis present

## 2024-03-19 DIAGNOSIS — I959 Hypotension, unspecified: Secondary | ICD-10-CM

## 2024-03-19 DIAGNOSIS — E785 Hyperlipidemia, unspecified: Secondary | ICD-10-CM | POA: Diagnosis present

## 2024-03-19 DIAGNOSIS — I2510 Atherosclerotic heart disease of native coronary artery without angina pectoris: Secondary | ICD-10-CM | POA: Diagnosis present

## 2024-03-19 DIAGNOSIS — I5031 Acute diastolic (congestive) heart failure: Secondary | ICD-10-CM | POA: Diagnosis not present

## 2024-03-19 DIAGNOSIS — K5900 Constipation, unspecified: Secondary | ICD-10-CM | POA: Diagnosis not present

## 2024-03-19 DIAGNOSIS — I060 Rheumatic aortic stenosis: Secondary | ICD-10-CM | POA: Diagnosis not present

## 2024-03-19 DIAGNOSIS — K7460 Unspecified cirrhosis of liver: Secondary | ICD-10-CM | POA: Diagnosis present

## 2024-03-19 DIAGNOSIS — G4700 Insomnia, unspecified: Secondary | ICD-10-CM | POA: Diagnosis present

## 2024-03-19 DIAGNOSIS — I700 Atherosclerosis of aorta: Secondary | ICD-10-CM | POA: Diagnosis present

## 2024-03-19 DIAGNOSIS — I425 Other restrictive cardiomyopathy: Secondary | ICD-10-CM | POA: Diagnosis present

## 2024-03-19 DIAGNOSIS — I482 Chronic atrial fibrillation, unspecified: Secondary | ICD-10-CM | POA: Diagnosis present

## 2024-03-19 DIAGNOSIS — R188 Other ascites: Secondary | ICD-10-CM

## 2024-03-19 DIAGNOSIS — I311 Chronic constrictive pericarditis: Secondary | ICD-10-CM | POA: Diagnosis present

## 2024-03-19 DIAGNOSIS — N1832 Chronic kidney disease, stage 3b: Secondary | ICD-10-CM

## 2024-03-19 DIAGNOSIS — Z6829 Body mass index (BMI) 29.0-29.9, adult: Secondary | ICD-10-CM

## 2024-03-19 DIAGNOSIS — Z789 Other specified health status: Secondary | ICD-10-CM | POA: Diagnosis not present

## 2024-03-19 DIAGNOSIS — I129 Hypertensive chronic kidney disease with stage 1 through stage 4 chronic kidney disease, or unspecified chronic kidney disease: Secondary | ICD-10-CM | POA: Diagnosis present

## 2024-03-19 DIAGNOSIS — Z7984 Long term (current) use of oral hypoglycemic drugs: Secondary | ICD-10-CM

## 2024-03-19 DIAGNOSIS — N179 Acute kidney failure, unspecified: Secondary | ICD-10-CM | POA: Diagnosis not present

## 2024-03-19 DIAGNOSIS — R0602 Shortness of breath: Principal | ICD-10-CM

## 2024-03-19 DIAGNOSIS — I4891 Unspecified atrial fibrillation: Secondary | ICD-10-CM | POA: Diagnosis not present

## 2024-03-19 DIAGNOSIS — I352 Nonrheumatic aortic (valve) stenosis with insufficiency: Secondary | ICD-10-CM | POA: Diagnosis not present

## 2024-03-19 DIAGNOSIS — I3481 Nonrheumatic mitral (valve) annulus calcification: Secondary | ICD-10-CM | POA: Diagnosis present

## 2024-03-19 DIAGNOSIS — Z7901 Long term (current) use of anticoagulants: Secondary | ICD-10-CM

## 2024-03-19 DIAGNOSIS — Z85828 Personal history of other malignant neoplasm of skin: Secondary | ICD-10-CM

## 2024-03-19 DIAGNOSIS — E119 Type 2 diabetes mellitus without complications: Secondary | ICD-10-CM | POA: Diagnosis not present

## 2024-03-19 DIAGNOSIS — I272 Pulmonary hypertension, unspecified: Secondary | ICD-10-CM | POA: Diagnosis present

## 2024-03-19 DIAGNOSIS — E1129 Type 2 diabetes mellitus with other diabetic kidney complication: Secondary | ICD-10-CM | POA: Diagnosis present

## 2024-03-19 DIAGNOSIS — Z7189 Other specified counseling: Secondary | ICD-10-CM | POA: Diagnosis not present

## 2024-03-19 DIAGNOSIS — E878 Other disorders of electrolyte and fluid balance, not elsewhere classified: Secondary | ICD-10-CM | POA: Diagnosis not present

## 2024-03-19 DIAGNOSIS — I9589 Other hypotension: Secondary | ICD-10-CM | POA: Diagnosis not present

## 2024-03-19 LAB — TROPONIN I (HIGH SENSITIVITY)
Troponin I (High Sensitivity): 26 ng/L — ABNORMAL HIGH (ref <18)
Troponin I (High Sensitivity): 25 ng/L — ABNORMAL HIGH (ref <18)

## 2024-03-19 LAB — PHOSPHORUS: Phosphorus: 4.7 mg/dL — ABNORMAL HIGH (ref 2.5–4.6)

## 2024-03-19 LAB — CBC
HCT: 42 % (ref 36.0–46.0)
Hemoglobin: 12.9 g/dL (ref 12.0–15.0)
MCH: 21.6 pg — ABNORMAL LOW (ref 26.0–34.0)
MCHC: 30.7 g/dL (ref 30.0–36.0)
MCV: 70.5 fL — ABNORMAL LOW (ref 80.0–100.0)
Platelets: 313 10*3/uL (ref 150–400)
RBC: 5.96 MIL/uL — ABNORMAL HIGH (ref 3.87–5.11)
RDW: 20.5 % — ABNORMAL HIGH (ref 11.5–15.5)
WBC: 6.1 10*3/uL (ref 4.0–10.5)
nRBC: 0 % (ref 0.0–0.2)

## 2024-03-19 LAB — MAGNESIUM: Magnesium: 2.3 mg/dL (ref 1.7–2.4)

## 2024-03-19 LAB — BASIC METABOLIC PANEL WITH GFR
Anion gap: 14 (ref 5–15)
Anion gap: 13 (ref 5–15)
Anion gap: 14 (ref 5–15)
BUN: 76 mg/dL — ABNORMAL HIGH (ref 8–23)
BUN: 75 mg/dL — ABNORMAL HIGH (ref 8–23)
BUN: 72 mg/dL — ABNORMAL HIGH (ref 8–23)
CO2: 23 mmol/L (ref 22–32)
CO2: 24 mmol/L (ref 22–32)
CO2: 24 mmol/L (ref 22–32)
Calcium: 9.5 mg/dL (ref 8.9–10.3)
Calcium: 9.4 mg/dL (ref 8.9–10.3)
Calcium: 9.2 mg/dL (ref 8.9–10.3)
Chloride: 90 mmol/L — ABNORMAL LOW (ref 98–111)
Chloride: 90 mmol/L — ABNORMAL LOW (ref 98–111)
Chloride: 89 mmol/L — ABNORMAL LOW (ref 98–111)
Creatinine, Ser: 2.81 mg/dL — ABNORMAL HIGH (ref 0.44–1.00)
Creatinine, Ser: 2.59 mg/dL — ABNORMAL HIGH (ref 0.44–1.00)
Creatinine, Ser: 2.61 mg/dL — ABNORMAL HIGH (ref 0.44–1.00)
GFR, Estimated: 17 mL/min — ABNORMAL LOW (ref >60)
GFR, Estimated: 19 mL/min — ABNORMAL LOW (ref >60)
GFR, Estimated: 18 mL/min — ABNORMAL LOW (ref >60)
Glucose, Bld: 159 mg/dL — ABNORMAL HIGH (ref 70–99)
Glucose, Bld: 197 mg/dL — ABNORMAL HIGH (ref 70–99)
Glucose, Bld: 151 mg/dL — ABNORMAL HIGH (ref 70–99)
Potassium: 4.4 mmol/L (ref 3.5–5.1)
Potassium: 3.6 mmol/L (ref 3.5–5.1)
Potassium: 3.3 mmol/L — ABNORMAL LOW (ref 3.5–5.1)
Sodium: 127 mmol/L — ABNORMAL LOW (ref 135–145)
Sodium: 127 mmol/L — ABNORMAL LOW (ref 135–145)
Sodium: 127 mmol/L — ABNORMAL LOW (ref 135–145)

## 2024-03-19 LAB — OSMOLALITY: Osmolality: 295 mosm/kg (ref 275–295)

## 2024-03-19 LAB — BRAIN NATRIURETIC PEPTIDE: B Natriuretic Peptide: 265.4 pg/mL — ABNORMAL HIGH (ref 0.0–100.0)

## 2024-03-19 LAB — HEPATIC FUNCTION PANEL
ALT: 16 U/L (ref 0–44)
AST: 19 U/L (ref 15–41)
Albumin: 3.8 g/dL (ref 3.5–5.0)
Alkaline Phosphatase: 88 U/L (ref 38–126)
Bilirubin, Direct: 0.6 mg/dL — ABNORMAL HIGH (ref 0.0–0.2)
Indirect Bilirubin: 1.5 mg/dL — ABNORMAL HIGH (ref 0.3–0.9)
Total Bilirubin: 2.1 mg/dL — ABNORMAL HIGH (ref 0.0–1.2)
Total Protein: 6.3 g/dL — ABNORMAL LOW (ref 6.5–8.1)

## 2024-03-19 LAB — GLUCOSE, CAPILLARY: Glucose-Capillary: 163 mg/dL — ABNORMAL HIGH (ref 70–99)

## 2024-03-19 MED ORDER — APIXABAN 5 MG PO TABS
5.0000 mg | ORAL_TABLET | Freq: Two times a day (BID) | ORAL | Status: DC
  Administered 2024-03-19 – 2024-03-21 (×5): 5 mg via ORAL
  Filled 2024-03-19 (×6): qty 1

## 2024-03-19 MED ORDER — FUROSEMIDE 10 MG/ML IJ SOLN
40.0000 mg | Freq: Once | INTRAMUSCULAR | Status: AC
  Administered 2024-03-19: 40 mg via INTRAVENOUS
  Filled 2024-03-19: qty 4

## 2024-03-19 MED ORDER — ALBUTEROL SULFATE (2.5 MG/3ML) 0.083% IN NEBU: 3.0000 mL | INHALATION_SOLUTION | RESPIRATORY_TRACT | Status: DC | PRN

## 2024-03-19 MED ORDER — FOLIC ACID 1 MG PO TABS
1.0000 mg | ORAL_TABLET | Freq: Every day | ORAL | Status: DC
  Administered 2024-03-20 – 2024-04-03 (×15): 1 mg via ORAL
  Filled 2024-03-19 (×15): qty 1

## 2024-03-19 MED ORDER — LATANOPROST 0.005 % OP SOLN
1.0000 [drp] | Freq: Every day | OPHTHALMIC | Status: DC
  Administered 2024-03-19 – 2024-04-01 (×13): 1 [drp] via OPHTHALMIC
  Filled 2024-03-19 (×2): qty 2.5

## 2024-03-19 MED ORDER — SPIRONOLACTONE 25 MG PO TABS
50.0000 mg | ORAL_TABLET | Freq: Every day | ORAL | Status: DC
  Administered 2024-03-20 – 2024-03-22 (×3): 50 mg via ORAL
  Filled 2024-03-19 (×3): qty 2

## 2024-03-19 MED ORDER — METOPROLOL SUCCINATE ER 25 MG PO TB24
12.5000 mg | ORAL_TABLET | Freq: Every day | ORAL | Status: DC
  Administered 2024-03-20 – 2024-03-22 (×3): 12.5 mg via ORAL
  Filled 2024-03-19 (×3): qty 1

## 2024-03-19 MED ORDER — PANTOPRAZOLE SODIUM 40 MG PO TBEC
40.0000 mg | DELAYED_RELEASE_TABLET | Freq: Two times a day (BID) | ORAL | Status: DC
  Administered 2024-03-19 – 2024-04-03 (×29): 40 mg via ORAL
  Filled 2024-03-19 (×30): qty 1

## 2024-03-19 MED ORDER — DM-GUAIFENESIN ER 30-600 MG PO TB12: 1.0000 | ORAL_TABLET | Freq: Two times a day (BID) | ORAL | Status: DC | PRN

## 2024-03-19 MED ORDER — INSULIN GLARGINE-YFGN 100 UNIT/ML ~~LOC~~ SOPN
10.0000 [IU] | PEN_INJECTOR | Freq: Every day | SUBCUTANEOUS | Status: DC
  Filled 2024-03-19: qty 3

## 2024-03-19 MED ORDER — INSULIN GLARGINE-YFGN 100 UNIT/ML ~~LOC~~ SOLN
10.0000 [IU] | Freq: Every day | SUBCUTANEOUS | Status: DC
  Administered 2024-03-19 – 2024-03-22 (×4): 10 [IU] via SUBCUTANEOUS
  Filled 2024-03-19 (×4): qty 0.1

## 2024-03-19 MED ORDER — ONDANSETRON HCL 4 MG/2ML IJ SOLN: 4.0000 mg | Freq: Three times a day (TID) | INTRAMUSCULAR | Status: DC | PRN

## 2024-03-19 MED ORDER — HYDRALAZINE HCL 20 MG/ML IJ SOLN: 5.0000 mg | INTRAMUSCULAR | Status: DC | PRN

## 2024-03-19 MED ORDER — ACETAMINOPHEN 325 MG PO TABS
650.0000 mg | ORAL_TABLET | Freq: Four times a day (QID) | ORAL | Status: DC | PRN
  Administered 2024-03-22 – 2024-04-03 (×2): 650 mg via ORAL
  Filled 2024-03-19 (×2): qty 2

## 2024-03-19 MED ORDER — INSULIN ASPART 100 UNIT/ML IJ SOLN
0.0000 [IU] | Freq: Every day | INTRAMUSCULAR | Status: DC
  Administered 2024-03-24 – 2024-03-28 (×2): 2 [IU] via SUBCUTANEOUS
  Administered 2024-03-29: 3 [IU] via SUBCUTANEOUS
  Administered 2024-04-01: 2 [IU] via SUBCUTANEOUS
  Filled 2024-03-19 (×4): qty 1

## 2024-03-19 MED ORDER — SODIUM CHLORIDE 1 G PO TABS
1.0000 g | ORAL_TABLET | Freq: Two times a day (BID) | ORAL | Status: DC
  Administered 2024-03-19 – 2024-03-20 (×2): 1 g via ORAL
  Filled 2024-03-19 (×2): qty 1

## 2024-03-19 MED ORDER — INSULIN ASPART 100 UNIT/ML IJ SOLN
0.0000 [IU] | Freq: Three times a day (TID) | INTRAMUSCULAR | Status: DC
  Administered 2024-03-20: 1 [IU] via SUBCUTANEOUS
  Administered 2024-03-20: 3 [IU] via SUBCUTANEOUS
  Administered 2024-03-20: 2 [IU] via SUBCUTANEOUS
  Administered 2024-03-21: 5 [IU] via SUBCUTANEOUS
  Administered 2024-03-21 (×2): 1 [IU] via SUBCUTANEOUS
  Administered 2024-03-22 – 2024-03-23 (×3): 2 [IU] via SUBCUTANEOUS
  Administered 2024-03-23: 5 [IU] via SUBCUTANEOUS
  Administered 2024-03-23: 3 [IU] via SUBCUTANEOUS
  Administered 2024-03-24 (×2): 2 [IU] via SUBCUTANEOUS
  Administered 2024-03-24: 3 [IU] via SUBCUTANEOUS
  Filled 2024-03-19 (×14): qty 1

## 2024-03-19 MED ORDER — MIDODRINE HCL 5 MG PO TABS
10.0000 mg | ORAL_TABLET | Freq: Three times a day (TID) | ORAL | Status: DC
  Administered 2024-03-19 – 2024-03-22 (×8): 10 mg via ORAL
  Filled 2024-03-19 (×8): qty 2

## 2024-03-19 MED ORDER — ROSUVASTATIN CALCIUM 10 MG PO TABS
5.0000 mg | ORAL_TABLET | ORAL | Status: DC
  Administered 2024-03-26 – 2024-04-02 (×2): 5 mg via ORAL
  Filled 2024-03-19 (×6): qty 1

## 2024-03-19 MED ORDER — ALLOPURINOL 100 MG PO TABS
100.0000 mg | ORAL_TABLET | Freq: Every day | ORAL | Status: DC
  Administered 2024-03-20 – 2024-04-03 (×15): 100 mg via ORAL
  Filled 2024-03-19 (×15): qty 1

## 2024-03-19 NOTE — ED Notes (Signed)
 Pt taken to CT. Lasix  to be given once back.

## 2024-03-19 NOTE — ED Notes (Signed)
 Pt ambulatory to the bathroom. Gait steady

## 2024-03-19 NOTE — H&P (Signed)
 History and Physical    VELIA PAMER HQI:[id number] DOB: June 05, 1946 DOA: 03/19/2024  Referring MD/NP/PA:   PCP: Solomon Dupre, DO   Patient coming from:  The patient is coming from home.     Chief Complaint: SOB, worsening leg edema  HPI: Karen Farley is a 78 y.o. female with medical history significant of diastolic CHF, hypertension, hyperlipidemia, diabetes mellitus, PAD, gout, GI bleeding, CKD-3B, A-fib on Eliquis , chronic hyponatremia, chronic venous insufficiency, lymphedema, who presents with SOB and worsening leg edema.  She states that she has SOB for more than 2 weeks, which has been progressively worsening the past several days.  She has dry cough, no chest pain.  No fever or chills.  She has worsening bilateral leg edema and abdominal distention.  She has gained about 15 pounds recently.  She has orthopnea.  She is constipated, no nausea, vomiting, diarrhea or abdominal pain.  No symptoms of UTI.  Data reviewed independently and ED Course: pt was found to have BNP 265, WBC 6.1, worsening renal function, sodium 127, troponin 26 --> 25, temperature normal, blood pressure 101/82, heart rate 79, RR 18, oxygen saturation 97% on room air.  Chest x-ray negative for infiltration.  Patient is admitted to telemetry bed as inpatient.  CT of chest: 1. No CT evidence for acute intrathoracic abnormality. 2. Borderline cardiomegaly. Mild diffuse pericardial thickening versus small volume fluid. 3. Aortic valvular calcifications. 4. Mild distal circumferential thickening of the esophagus, question esophagitis or reflux. Nonemergent endoscopy evaluation suggested 5. 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 6. Aortic atherosclerosis.   Aortic Atherosclerosis (ICD10-I70.0).     EKG: I have personally reviewed.  A-fib, QTc 447, early R wave progression.   Review of Systems:   General: no fevers, chills, has body weight gain, has fatigue HEENT: no blurry vision, hearing changes or sore  throat Respiratory: has dyspnea, coughing, no wheezing CV: no chest pain, no palpitations GI: no nausea, vomiting, abdominal pain, diarrhea, has constipation and abdominal distention GU: no dysuria, burning on urination, increased urinary frequency, hematuria  Ext: has leg edema Neuro: no unilateral weakness, numbness, or tingling, no vision change or hearing loss Skin: no rash, no skin tear. MSK: No muscle spasm, no deformity, no limitation of range of movement in spin Heme: No easy bruising.  Travel history: No recent long distant travel.   Allergy: No Known Allergies  Past Medical History:  Diagnosis Date   A-fib (HCC)    CHF (congestive heart failure) (HCC)    Chronic heart failure with preserved ejection fraction (HFpEF) (HCC)    a. 11/2019 Echo: EF 60-65%; b. 07/2021 Echo: EF 60-65%.   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 07/2021 req 2u prbcs. Eval deferred given resolution of bleeding and cardiac issues.   Hyperlipidemia    Hypertension    Lymphedema    Morbid obesity (HCC)    Osteoarthritis of both knees    Osteopenia    Pericardial effusion    a. 07/2021 Echo: Nl EF. Large pericardial effusion-->s/p pericardiocentesis of ; b. 08/23/2021 Echo: EF 60-65%, mild LVH, Nl RV fxn, sev dil LA, moderate pericardial effusion. L pleural effusion.   Permanent atrial fibrillation (HCC)    Pleural effusion, left    a. 07/2021 s/p thoracentesis - .  Past Surgical History:  Procedure Laterality Date   BASAL CELL CARCINOMA EXCISION  11/01/2022   CATARACT EXTRACTION W/PHACO Left 10/09/2021   Procedure: CATARACT EXTRACTION PHACO AND INTRAOCULAR LENS PLACEMENT (IOC) LEFT DIABETIC 4.84 00:37.8;  Surgeon: Clair Crews, MD;  Location: [facility];  Service:  Ophthalmology;  Laterality: Left;  Diabetic   CATARACT EXTRACTION W/PHACO Right 10/30/2021   Procedure: CATARACT EXTRACTION PHACO AND INTRAOCULAR LENS PLACEMENT (IOC) RIGHT DIABETIC 5.65 00:38.5;  Surgeon: Clair Crews, MD;  Location: [facility];  Service: Ophthalmology;  Laterality: Right;  Diabetic   CHOLECYSTECTOMY     DILATION AND CURETTAGE OF UTERUS     IR FLUORO GUIDE CV LINE RIGHT  08/16/2021   IR THORACENTESIS ASP PLEURAL SPACE W/IMG GUIDE  08/16/2021   PERICARDIOCENTESIS N/A 08/05/2021   Procedure: PERICARDIOCENTESIS;  Surgeon: Percival Brace, MD;  Location: [facility];  Service: Cardiovascular;  Laterality: N/A;   TEAR DUCT PROBING WITH STRABISMUS REPAIR Right    TONSILLECTOMY      Social History:  reports that she has never smoked. She has never used smokeless tobacco. She reports that she does not currently use alcohol. She reports that she does not use drugs.  Family History:  Family History  Problem Relation Age of Onset   Heart disease Mother        CHF   Osteoporosis Mother    Breast cancer Mother    Heart disease Father 77   Cancer Brother        Colon CA- 2002- Youngest brother   Parkinson's disease Brother        Younger Brother   Heart disease Brother        2 brothers   Diabetes Brother    Cancer Brother    Breast cancer Maternal Grandmother      Prior to Admission medications   Medication Sig Start Date End Date Taking? Authorizing Provider  allopurinol  (ZYLOPRIM ) 100 MG tablet Take 1 tablet (100 mg total) by mouth daily. 12/19/23   Johnson, [name], DO  apixaban  (ELIQUIS ) 5 MG TABS tablet Take 1 tablet (5 mg total) by mouth 2 (two) times daily. 12/19/23   Johnson, [name], DO  Continuous Glucose Sensor (DEXCOM G7 SENSOR) MISC by Does not apply route.    [provider]  empagliflozin  (JARDIANCE ) 25 MG TABS tablet TAKE 1 TABLET BY MOUTH ONCE DAILY BEFOREBREAKFAST 12/19/23   Terre Ferri P, DO  folic acid  (FOLVITE ) 1  MG tablet Take 1 tablet (1 mg total) by mouth daily. 09/30/23   Johnson, [name], DO  Glucagon  (GVOKE HYPOPEN  2-PACK) 0.5 MG/0.1ML SOAJ Inject 1 each into the skin daily as needed. 03/14/23   Johnson, [name], DO  insulin  glargine, 2 Unit Dial, (TOUJEO  MAX SOLOSTAR) 300 UNIT/ML Solostar Pen Inject 16-20 Units into the skin daily. 12/19/23 09/14/24  Terre Ferri P, DO  Insulin  Pen Needle (PEN NEEDLES) 32G X 4 MM MISC 1 each by Does not apply route 3 (three) times daily. Patient not taking: Reported on 02/24/2024 01/30/23   Terre Ferri P, DO  Insulin  Pen Needle (ULTRACARE PEN NEEDLES) 32G X 4 MM MISC USE 1 PEN ONCE DAILY Patient not taking: Reported on 02/24/2024 10/11/22   Terre Ferri P, DO  Insulin  Syringe-Needle U-100 (INSULIN  SYRINGE 1CC/31GX5/16") 31G X 5/16" 1 ML MISC 1 each by Does not apply route 3 (three) times daily. Patient not taking: Reported on 02/24/2024 12/13/22   Terre Ferri P, DO  Insulin  Syringe/Needle U-500 (BD  INSULIN  SYRINGE U-500) 31G X 0.5 ML MISC 1 each by Does not apply route 3 (three) times daily as needed. Patient not taking: Reported on 02/24/2024 03/14/23   Terre Ferri P, DO  latanoprost  (XALATAN ) 0.005 % ophthalmic solution Place 1 drop into both eyes at bedtime.    [provider]  metolazone  (ZAROXOLYN ) 2.5 MG tablet Take 2.5 mg by mouth once a week. Wednesday    [provider]  metoprolol  succinate (TOPROL -XL) 25 MG 24 hr tablet Take 0.5 tablets (12.5 mg total) by mouth daily. 12/19/23   Johnson, [name], DO  metroNIDAZOLE (METROGEL) 1 % gel Apply to face every morning. 09/19/22   [provider]  midodrine  (PROAMATINE ) 10 MG tablet TAKE 1 TABLET BY MOUTH 3 TIMES DAILY WITH MEALS Patient not taking: Reported on 02/24/2024 02/24/23   Gollan, Timothy J, MD  NEEDLE, DISP, 30 G (BD DISP NEEDLES) 30G X 1/2" MISC by Does not apply route. Patient not taking: Reported on 02/24/2024    [provider]  pantoprazole  (PROTONIX ) 40 MG tablet  Take 1 tablet (40 mg total) by mouth 2 (two) times daily. 12/19/23   Terre Ferri P, DO  potassium chloride  SA (KLOR-CON  M) 20 MEQ tablet Take 2 tablets (40 mEq total) by mouth 2 (two) times daily. 12/19/23   Johnson, [name], DO  rosuvastatin  (CRESTOR ) 5 MG tablet Take 1 tablet (5 mg total) by mouth once a week. 05/30/23   Johnson, [name], DO  Semaglutide  (RYBELSUS ) 7 MG TABS Take 1 tablet (7 mg total) by mouth daily. 12/19/23   Johnson, [name], DO  spironolactone  (ALDACTONE ) 50 MG tablet Take 1 tablet (50 mg total) by mouth daily. 12/19/23   Johnson, [name], DO  torsemide  (DEMADEX ) 20 MG tablet TAKE 1 TO 2 TABLETS BY MOUTH DAILY. ONLY TAKE 2 TABLETS FOR 1 TO 3 DAYS AND IF PERSISTS, CALL WITH BAD SWELLING 12/19/23   Solomon Dupre, DO    Physical Exam: Vitals:   03/19/24 1615 03/19/24 1700 03/19/24 1855 03/19/24 2016  BP: 119/82 101/82  108/84  Pulse: 68 64  74  Resp:    18  Temp:   98 F (36.7 C) 97.8 F (36.6 C)  TempSrc:   Oral   SpO2: 98% 98%  98%  Weight:      Height:       General: Not in acute distress HEENT:       Eyes: PERRL, EOMI, no jaundice       ENT: No discharge from the ears and nose, no pharynx injection, no tonsillar enlargement.        Neck: positive JVD, no bruit, no mass felt. Heme: No neck lymph node enlargement. Cardiac: S1/S2, irregularly irregular rhythm, No gallops or rubs. Respiratory: Has fine crackles bilaterally GI: Soft, mildly distended, nontender, no rebound pain, no organomegaly, BS present. GU: No hematuria Ext: has 2+ pitting leg edema bilaterally. 1+DP/PT pulse bilaterally. Musculoskeletal: No joint deformities, No joint redness or warmth, no limitation of ROM in spin. Skin: No rashes.  Neuro: Alert, oriented X3, cranial nerves II-XII grossly intact, moves all extremities normally.   Labs on Admission: I have personally reviewed following labs and imaging studies  CBC: Recent Labs  Lab 03/19/24 1038  WBC 6.1  HGB 12.9  HCT 42.0  MCV  70.5*  PLT 313   Basic Metabolic Panel: Recent Labs  Lab 03/19/24 1038 03/19/24 2009  NA 127* 127*  K 4.4 3.6  CL 90* 90*  CO2 23 24  GLUCOSE 159* 197*  BUN 76* 75*  CREATININE 2.81* 2.59*  CALCIUM  9.5 9.4  MG 2.3  --   PHOS 4.7*  --    GFR: Estimated Creatinine Clearance: 15.8 mL/min (A) (by C-G formula based on SCr of 2.59 mg/dL (H)). Liver Function Tests: Recent Labs  Lab 03/19/24 1038  AST 19  ALT 16  ALKPHOS 88  BILITOT 2.1*  PROT 6.3*  ALBUMIN  3.8   No results for input(s): "LIPASE", "AMYLASE" in the last 168 hours. No results for input(s): "AMMONIA" in the last 168 hours. Coagulation Profile: No results for input(s): "INR", "PROTIME" in the last 168 hours. Cardiac Enzymes: No results for input(s): "CKTOTAL", "CKMB", "CKMBINDEX", "TROPONINI" in the last 168 hours. BNP (last 3 results) No results for input(s): "PROBNP" in the last 8760 hours. HbA1C: No results for input(s): "HGBA1C" in the last 72 hours. CBG: Recent Labs  Lab 03/19/24 2137  GLUCAP 163*   Lipid Profile: No results for input(s): "CHOL", "HDL", "LDLCALC", "TRIG", "CHOLHDL", "LDLDIRECT" in the last 72 hours. Thyroid  Function Tests: No results for input(s): "TSH", "T4TOTAL", "FREET4", "T3FREE", "THYROIDAB" in the last 72 hours. Anemia Panel: No results for input(s): "VITAMINB12", "FOLATE", "FERRITIN", "TIBC", "IRON ", "RETICCTPCT" in the last 72 hours. Urine analysis:    Component Value Date/Time   COLORURINE YELLOW (A) 04/11/2021 1215   APPEARANCEUR Clear 12/13/2022 1322   LABSPEC 1.011 04/11/2021 1215   PHURINE 5.0 04/11/2021 1215   GLUCOSEU 2+ (A) 12/13/2022 1322   HGBUR NEGATIVE 04/11/2021 1215   BILIRUBINUR Negative 12/13/2022 1322   KETONESUR NEGATIVE 04/11/2021 1215   PROTEINUR Negative 12/13/2022 1322   PROTEINUR NEGATIVE 04/11/2021 1215   NITRITE Negative 12/13/2022 1322   NITRITE NEGATIVE 04/11/2021 1215   LEUKOCYTESUR Negative 12/13/2022 1322   LEUKOCYTESUR MODERATE (A)  04/11/2021 1215   Sepsis Labs: @LABRCNTIP (procalcitonin:4,lacticidven:4) )No results found for this or any previous visit (from the past 240 hours).   Radiological Exams on Admission:   Assessment/Plan Principal Problem:   Acute on chronic diastolic heart failure (HCC) Active Problems:   Myocardial injury   Hyponatremia   Acute renal failure superimposed on stage 3b chronic kidney disease (HCC)   HTN (hypertension)   Atrial fibrillation, chronic (HCC)   Hyperlipidemia   Type 2 diabetes mellitus with renal complication (HCC)   Overweight (BMI 25.0-29.9)   Acute on chronic diastolic CHF (congestive heart failure) (HCC)   Assessment and Plan:   Acute on chronic diastolic heart failure (HCC): 2D echo 11/14/2021 showed EF 60 to 65%.  Patient has ST B, worsening leg edema, 15 pounds weight gain, elevated BNP 265, clinically consistent with CHF exacerbation.  Patient has hyponatremia which makes diuretics use complicated.   -Will admit to tele bed   as inpatient -Patient was given 1 dose of Lasix  40 mg by IV, will need to f/u BMP to see her sodium level before giving more Lasix  -Continue home spironolactone  -2d echo -Daily weights -strict I/O's -Low salt diet -Fluid restriction -As needed bronchodilators for shortness of breath  Myocardial injury: trop  26 --> 25.  No chest pain, likely due to demand ischemia secondary to CHF exacerbation. -Continue Crestor   Hyponatremia: Sodium 127.  Patient has hypervolemic hyponatremia. Ideally patient can be treated with hypertonic saline and IV diuretics, however patient's mental status normal, will not start hypertonic saline now. - Will check urine sodium, urine osmolality, serum osmolality. - Fluid restriction - Sodium chloride  tablet 1 g twice daily - f/u by BMP q8h -  avoid over correction too fast due to risk of central pontine myelinolysis  Acute renal failure superimposed on stage 3b chronic kidney disease (HCC): Baseline  creatinine 1.99 on 12/19/2023.  Her creatinine is at 2.81, BUN 76, GFR 17.  Likely due to cardiorenal syndrome. - Follow-up renal function closely - Avoid using renal toxic medications.  HTN (hypertension) -IV hydralazine  as needed - Metoprolol  which is also for A-fib - Patient is on spironolactone  and IV Lasix   Atrial fibrillation, chronic (HCC): currently heart rate 79 -Metoprolol  - Eliquis   Hyperlipidemia -Crestor   Type 2 diabetes mellitus with renal complication [facility]): Recent A1c 7.4, poorly controlled.  Patient said taking Jardiance , semaglutide , glargine insulin  16-20 units daily -Sliding scale insulin  - Glargine insulin  10 units daily  Overweight (BMI 25.0-29.9): Body weight 69.4 kg, BMI 29.88 -Encourage losing weight - Exercise and healthy diet      DVT ppx: on Eliquis   Code Status: Full code     Family Communication:     not done, no family member is at bed side.  Disposition Plan:  Anticipate discharge back to previous environment  Consults called:  none  Admission status and Level of care: Telemetry Cardiac:    as inpt        Dispo: The patient is from: Home              Anticipated d/c is to: Home              Anticipated d/c date is: 2 days              Patient currently is not medically stable to d/c.    Severity of Illness:  The appropriate patient status for this patient is INPATIENT. Inpatient status is judged to be reasonable and necessary in order to provide the required intensity of service to ensure the patient's safety. The patient's presenting symptoms, physical exam findings, and initial radiographic and laboratory data in the context of their chronic comorbidities is felt to place them at high risk for further clinical deterioration. Furthermore, it is not anticipated that the patient will be medically stable for discharge from the hospital within 2 midnights of admission.   * I certify that at the point of admission it is my clinical  judgment that the patient will require inpatient hospital care spanning beyond 2 midnights from the point of admission due to high intensity of service, high risk for further deterioration and high frequency of surveillance required.*       Date of Service 03/19/2024    Fidencio Hue [facility]   If 7PM-7AM, please contact night-coverage [email or web address] 03/19/2024, 9:45 PM

## 2024-03-19 NOTE — ED Provider Notes (Signed)
 [facility] Provider Note    Event Date/Time   First MD Initiated Contact with Patient 03/19/24 1355     (approximate)   History   Shortness of Breath   HPI  Karen Farley is a 78 y.o. female with a history of HFpEF, hypertension, CKD, atrial fibrillation, hyperlipidemia, diabetes, and lymphedema who presents with worsening shortness of breath over the last several weeks gradual onset, worse at night when she tries to lie flat and with minimal exertion.  The patient states that she has to use a walker and can only take a few steps before being short of breath.  She has also gained approximately 15 pounds from her baseline over the last few months.  She is on torsemide  and metolazone  at home and has had to double her dose of torsemide  several times recently.  She also reports increased abdominal distention and leg swelling.  She denies any cough or fever.  She has no chest pain.  I reviewed the past medical records.  The patient's most recent outpatient encounter was with Dr. Gollan from cardiology on 5/6 for follow-up of her chronic conditions.  At that time she had increased abdominal and leg swelling and was short of breath.  She was restarted on metolazone  and her torsemide  was increased.  Physical Exam   Triage Vital Signs: ED Triage Vitals  Encounter Vitals Group     BP 03/19/24 1036 108/86     Systolic BP Percentile --      Diastolic BP Percentile --      Pulse Rate 03/19/24 1036 79     Resp 03/19/24 1036 18     Temp 03/19/24 1036 98 F (36.7 C)     Temp Source 03/19/24 1036 Oral     SpO2 03/19/24 1036 100 %     Weight 03/19/24 1034 153 lb (69.4 kg)     Height 03/19/24 1034 5' (1.524 m)     Head Circumference --      Peak Flow --      Pain Score 03/19/24 1033 0     Pain Loc --      Pain Education --      Exclude from Growth Chart --     Most recent vital signs: Vitals:   03/19/24 1036 03/19/24 1433  BP: 108/86 109/80  Pulse: 79 70   Resp: 18 18  Temp: 98 F (36.7 C) 97.7 F (36.5 C)  SpO2: 100% 97%     General: Alert, comfortable appearing, no distress.  CV:  Good peripheral perfusion.  Resp:  Normal effort.  Lungs CTAB. Abd:  Mild distention.  No abdominal wall edema. Other:  2+ bilateral lower extremity edema.   ED Results / Procedures / Treatments   Labs (all labs ordered are listed, but only abnormal results are displayed) Labs Reviewed  BASIC METABOLIC PANEL WITH GFR - Abnormal; Notable for the following components:      Result Value   Sodium 127 (*)    Chloride 90 (*)    Glucose, Bld 159 (*)    BUN 76 (*)    Creatinine, Ser 2.81 (*)    GFR, Estimated 17 (*)    All other components within normal limits  CBC - Abnormal; Notable for the following components:   RBC 5.96 (*)    MCV 70.5 (*)    MCH 21.6 (*)    RDW 20.5 (*)    All other components within normal limits  BRAIN NATRIURETIC  PEPTIDE - Abnormal; Notable for the following components:   B Natriuretic Peptide 265.4 (*)    All other components within normal limits  HEPATIC FUNCTION PANEL - Abnormal; Notable for the following components:   Total Protein 6.3 (*)    Total Bilirubin 2.1 (*)    Bilirubin, Direct 0.6 (*)    Indirect Bilirubin 1.5 (*)    All other components within normal limits  TROPONIN I (HIGH SENSITIVITY) - Abnormal; Notable for the following components:   Troponin I (High Sensitivity) 26 (*)    All other components within normal limits     EKG  ED ECG REPORT I, Lind Repine, the attending physician, personally viewed and interpreted this ECG.  Date: 03/19/2024 EKG Time: 1046 Rate: 80 Rhythm: Atrial fibrillation QRS Axis: normal Intervals: normal ST/T Wave abnormalities: Nonspecific T wave abnormality Narrative Interpretation: no evidence of acute ischemia    RADIOLOGY  Chest x-ray: I independently viewed and interpreted the images; there is no focal consolidation or edema     PROCEDURES:  Critical Care performed: No  Procedures   MEDICATIONS ORDERED IN ED: Medications  furosemide  (LASIX ) injection 40 mg (has no administration in time range)     IMPRESSION / MDM / ASSESSMENT AND PLAN / ED COURSE  I reviewed the triage vital signs and the nursing notes.  78 year old female with PMH as noted above including CKD and CHF presents with worsening shortness of breath, abdominal swelling, leg swelling, and decreased exercise tolerance.  On exam the patient does have peripheral edema and some abdominal distention, but not anasarca.  Lungs are clear.  Vital signs are normal including O2 saturation in the high 90s on room air.  Differential diagnosis includes, but is not limited to, CHF, other cardiac cause, fluid overload due to CKD, less likely pneumonia, bronchitis, other pulmonary etiology.  There is no clinical evidence for PE given lack of tachycardia, hypoxia, or chest pain.  Chest x-ray does not show significant edema.  BNP is only mildly elevated although appears to be higher than the patient's baseline.  BMP shows worsening creatinine and GFR from her baseline.  Since the chest x-ray is nondiagnostic I have added on a CT chest to evaluate for edema or effusion, as well as troponin, and LFTs.  I have ordered IV Lasix .  Patient's presentation is most consistent with acute presentation with potential threat to life or bodily function.  The patient is on the cardiac monitor to evaluate for evidence of arrhythmia and/or significant heart rate changes.   ----------------------------------------- 3:39 PM on 03/19/2024 -----------------------------------------  Troponin is minimally elevated.  LFTs are unremarkable.  CT is pending.  I have signed the patient out to the oncoming team physician Dr. Martina Sledge.  FINAL CLINICAL IMPRESSION(S) / ED DIAGNOSES   Final diagnoses:  Shortness of breath     Rx / DC Orders   ED Discharge Orders     None         Note:  This document was prepared using Dragon voice recognition software and may include unintentional dictation errors.    Lind Repine, MD 03/19/24 1539

## 2024-03-19 NOTE — ED Triage Notes (Signed)
 Pt presents to the ED pov from home with abdominal distention, leg swelling and SHOB. Pt reports taking torsemide  as prescribed. Reports ongoing hx of CHF. States that the swelling and SHOB have gotten worse.

## 2024-03-19 NOTE — ED Notes (Signed)
 Pt c/o

## 2024-03-19 NOTE — ED Provider Notes (Signed)
 Asked to follow-up on the CT scan, plan is for admission for CHF exacerbation   Bryson Carbine, MD 03/19/24 1810

## 2024-03-20 ENCOUNTER — Inpatient Hospital Stay

## 2024-03-20 ENCOUNTER — Inpatient Hospital Stay (HOSPITAL_COMMUNITY)
Admit: 2024-03-20 | Discharge: 2024-03-20 | Disposition: A | Discharge status: Home / Self Care | Attending: Internal Medicine | Admitting: Internal Medicine

## 2024-03-20 DIAGNOSIS — K7460 Unspecified cirrhosis of liver: Secondary | ICD-10-CM

## 2024-03-20 DIAGNOSIS — I5033 Acute on chronic diastolic (congestive) heart failure: Secondary | ICD-10-CM | POA: Diagnosis not present

## 2024-03-20 DIAGNOSIS — I959 Hypotension, unspecified: Secondary | ICD-10-CM

## 2024-03-20 DIAGNOSIS — I5031 Acute diastolic (congestive) heart failure: Secondary | ICD-10-CM | POA: Diagnosis not present

## 2024-03-20 DIAGNOSIS — I482 Chronic atrial fibrillation, unspecified: Secondary | ICD-10-CM | POA: Diagnosis not present

## 2024-03-20 DIAGNOSIS — R188 Other ascites: Secondary | ICD-10-CM

## 2024-03-20 LAB — CBC
HCT: 38.3 % (ref 36.0–46.0)
Hemoglobin: 12 g/dL (ref 12.0–15.0)
MCH: 21.5 pg — ABNORMAL LOW (ref 26.0–34.0)
MCHC: 31.3 g/dL (ref 30.0–36.0)
MCV: 68.8 fL — ABNORMAL LOW (ref 80.0–100.0)
Platelets: 281 10*3/uL (ref 150–400)
RBC: 5.57 MIL/uL — ABNORMAL HIGH (ref 3.87–5.11)
RDW: 20.1 % — ABNORMAL HIGH (ref 11.5–15.5)
WBC: 5.5 10*3/uL (ref 4.0–10.5)
nRBC: 0 % (ref 0.0–0.2)

## 2024-03-20 LAB — GLUCOSE, CAPILLARY
Glucose-Capillary: 130 mg/dL — ABNORMAL HIGH (ref 70–99)
Glucose-Capillary: 204 mg/dL — ABNORMAL HIGH (ref 70–99)
Glucose-Capillary: 194 mg/dL — ABNORMAL HIGH (ref 70–99)
Glucose-Capillary: 146 mg/dL — ABNORMAL HIGH (ref 70–99)

## 2024-03-20 LAB — BASIC METABOLIC PANEL WITH GFR
Anion gap: 16 — ABNORMAL HIGH (ref 5–15)
Anion gap: 15 (ref 5–15)
Anion gap: 16 — ABNORMAL HIGH (ref 5–15)
BUN: 73 mg/dL — ABNORMAL HIGH (ref 8–23)
BUN: 80 mg/dL — ABNORMAL HIGH (ref 8–23)
BUN: 83 mg/dL — ABNORMAL HIGH (ref 8–23)
CO2: 23 mmol/L (ref 22–32)
CO2: 24 mmol/L (ref 22–32)
CO2: 24 mmol/L (ref 22–32)
Calcium: 9.2 mg/dL (ref 8.9–10.3)
Calcium: 9.5 mg/dL (ref 8.9–10.3)
Calcium: 9.5 mg/dL (ref 8.9–10.3)
Chloride: 91 mmol/L — ABNORMAL LOW (ref 98–111)
Chloride: 91 mmol/L — ABNORMAL LOW (ref 98–111)
Chloride: 89 mmol/L — ABNORMAL LOW (ref 98–111)
Creatinine, Ser: 2.64 mg/dL — ABNORMAL HIGH (ref 0.44–1.00)
Creatinine, Ser: 2.65 mg/dL — ABNORMAL HIGH (ref 0.44–1.00)
Creatinine, Ser: 2.58 mg/dL — ABNORMAL HIGH (ref 0.44–1.00)
GFR, Estimated: 18 mL/min — ABNORMAL LOW (ref >60)
GFR, Estimated: 18 mL/min — ABNORMAL LOW (ref >60)
GFR, Estimated: 19 mL/min — ABNORMAL LOW (ref >60)
Glucose, Bld: 111 mg/dL — ABNORMAL HIGH (ref 70–99)
Glucose, Bld: 182 mg/dL — ABNORMAL HIGH (ref 70–99)
Glucose, Bld: 128 mg/dL — ABNORMAL HIGH (ref 70–99)
Potassium: 3.5 mmol/L (ref 3.5–5.1)
Potassium: 3.2 mmol/L — ABNORMAL LOW (ref 3.5–5.1)
Potassium: 3.4 mmol/L — ABNORMAL LOW (ref 3.5–5.1)
Sodium: 130 mmol/L — ABNORMAL LOW (ref 135–145)
Sodium: 130 mmol/L — ABNORMAL LOW (ref 135–145)
Sodium: 129 mmol/L — ABNORMAL LOW (ref 135–145)

## 2024-03-20 MED ORDER — FUROSEMIDE 10 MG/ML IJ SOLN
40.0000 mg | Freq: Once | INTRAMUSCULAR | Status: AC
  Administered 2024-03-20: 40 mg via INTRAVENOUS
  Filled 2024-03-20: qty 4

## 2024-03-20 MED ORDER — METOLAZONE 2.5 MG PO TABS
2.5000 mg | ORAL_TABLET | Freq: Once | ORAL | Status: AC
  Administered 2024-03-20: 2.5 mg via ORAL
  Filled 2024-03-20: qty 1

## 2024-03-20 MED ORDER — POTASSIUM CHLORIDE CRYS ER 20 MEQ PO TBCR
10.0000 meq | EXTENDED_RELEASE_TABLET | Freq: Three times a day (TID) | ORAL | Status: DC
  Administered 2024-03-20 – 2024-03-22 (×8): 10 meq via ORAL
  Filled 2024-03-20 (×8): qty 1

## 2024-03-20 MED ORDER — POLYETHYLENE GLYCOL 3350 17 G PO PACK
17.0000 g | PACK | Freq: Every day | ORAL | Status: DC | PRN
  Administered 2024-03-20: 17 g via ORAL
  Filled 2024-03-20 (×2): qty 1

## 2024-03-20 MED ORDER — FUROSEMIDE 10 MG/ML IJ SOLN: 40.0000 mg | Freq: Two times a day (BID) | INTRAMUSCULAR | Status: DC

## 2024-03-20 NOTE — TOC Initial Note (Signed)
 Transition of Care [facility]) - Initial/Assessment Note    Patient Details  Name: Karen Farley MRN: [id number] Date of Birth: 1946-09-19  Transition of Care [facility]) CM/SW Contact:    Alexandra Ice, RN Phone Number: 03/20/2024, 2:18 PM  Clinical Narrative:                  Patient lives with spouse. She has RW at home. She has PCP in the community.  TOC will follow for discharge needs.       Patient Goals and CMS Choice            Expected Discharge Plan and Services                                              Prior Living Arrangements/Services                       Activities of Daily Living      Permission Sought/Granted                  Emotional Assessment              Admission diagnosis:  Shortness of breath [R06.02] Acute on chronic diastolic heart failure (HCC) [I50.33] Acute on chronic diastolic CHF (congestive heart failure) (HCC) [I50.33] Patient Active Problem List   Diagnosis Date Noted   Hypotension 03/20/2024   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/20/2024   Acute on chronic diastolic heart failure (HCC) 03/19/2024   HTN (hypertension) 03/19/2024   Acute renal failure superimposed on stage 3b chronic kidney disease (HCC) 03/19/2024   Overweight (BMI 25.0-29.9) 03/19/2024   Myocardial injury 03/19/2024   Atrial fibrillation, chronic (HCC) 03/19/2024   Acute idiopathic gout involving toe 09/30/2023   Senile purpura (HCC) 12/13/2022   Pulmonary hypertension, primary (HCC) 09/07/2021   Pericardial effusion 08/05/2021   Acute blood loss anemia 08/04/2021   Anasarca    Stage 3b chronic kidney disease (HCC) 05/17/2021   Pleural effusion 05/17/2021   Acute on chronic diastolic CHF (congestive heart failure) (HCC) 05/17/2021   Chronic anticoagulation 05/17/2021   Acute kidney injury superimposed on CKD (HCC) 04/11/2021   Hyponatremia 04/11/2021   Acute on chronic heart failure with preserved ejection  fraction (HFpEF) (HCC) 04/11/2021   Chronic venous insufficiency 08/14/2020   PAD (peripheral artery disease) (HCC) 08/14/2020   Bilateral primary osteoarthritis of knee 04/13/2020   Acquired spondylolisthesis 12/17/2019   Lumbar radiculopathy 12/17/2019   Atrial fibrillation (HCC) 11/20/2019   Closed fracture of radial styloid 08/06/2019   Displaced fracture of unspecified radial styloid process, initial encounter for closed fracture 08/06/2019   Controlled substance agreement signed 01/24/2018   Piriformis syndrome 07/01/2016   Sacroiliac joint dysfunction 01/09/2016   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 diabetes mellitus (HCC) 04/28/2015   Lymphedema 03/27/2015   Open-angle glaucoma, mild stage    Osteoarthritis of both knees    Type 2 diabetes mellitus with renal complication (HCC)    Osteopenia    Benign hypertensive renal disease    Hyperlipidemia    Degeneration of lumbosacral intervertebral disc    PCP:  Solomon Dupre, DO Pharmacy:   [facility], [place] - [address] [place] [address] Phone: [phone] Fax: [phone]  [facility] - [place], [place] -  [address] [place] [address] Phone: [phone] Fax: [phone]  [id number] - [place], [place] - [address] [place] [address] Phone: [phone] Fax: [phone]     Social Drivers of Health (SDOH) Social History: SDOH Screenings   Food Insecurity: No Food Insecurity (03/19/2024)  Housing: Low Risk  (03/19/2024)  Transportation Needs: No Transportation Needs (03/19/2024)  Utilities: Not At Risk (03/19/2024)  Alcohol Screen: Low Risk  (07/23/2022)  Depression (PHQ2-9): Low Risk  (12/19/2023)  Financial Resource Strain: Low Risk  (07/23/2022)  Physical Activity: Sufficiently Active (07/23/2022)  Social Connections: Unknown (03/19/2024)  Stress: No Stress Concern Present (07/23/2022)  Tobacco Use: Low Risk  (03/19/2024)   SDOH Interventions:      Readmission Risk Interventions     No data to display

## 2024-03-20 NOTE — Progress Notes (Signed)
 [facility] [place], [place] 03/20/24  Subjective:   Hospital day # 1   Patient known to our practice from outpatient follow-up of CKD.  She presented to the emergency room with worsening shortness of breath with exertion.  Normally she is able to drive and carry on activities of daily living with ease.  For the last 2 to 3 weeks and especially last 4 days she has been getting short of breath and having to rest even to go from 1 room to the next.  She has developed worsening lower extremity edema.  She is now presented to the hospital for further management. She is currently receiving IV diuresis with furosemide .  Currently on room air.  Weight 68.1 kg today. Renal: No intake/output data recorded. Lab Results  Component Value Date   CREATININE 2.65 (H) 03/20/2024   CREATININE 2.64 (H) 03/20/2024   CREATININE 2.61 (H) 03/19/2024     Objective:  Vital signs in last 24 hours:  Temp:  [97.5 F (36.4 C)-98.3 F (36.8 C)] 98.3 F (36.8 C) (05/31 1210) Pulse Rate:  [64-83] 83 (05/31 1210) Resp:  [14-18] 14 (05/31 0802) BP: (96-119)/(63-84) 113/76 (05/31 1210) SpO2:  [97 %-99 %] 97 % (05/31 1210) Weight:  [68.1 kg-69.9 kg] 68.1 kg (05/31 0500)  Weight change:  Filed Weights   03/19/24 1034 03/19/24 2016 03/20/24 0500  Weight: 69.4 kg 69.9 kg 68.1 kg    Intake/Output:    Intake/Output Summary (Last 24 hours) at 03/20/2024 1510 Last data filed at 03/20/2024 1417 Gross per 24 hour  Intake 120 ml  Output --  Net 120 ml     Physical Exam: General: No acute distress, sitting in the chair  HEENT Moist mucous membranes  Pulm/lungs Normal breathing effort on room air  CVS/Heart Irregular rhythm  Abdomen:  Mildly distended  Extremities: 2+ pitting edema and lymphedema  Neurologic: Alert and oriented  Skin: No acute rashes          Basic Metabolic Panel:  Recent Labs  Lab 03/19/24 1038 03/19/24 2009 03/19/24 2224 03/20/24 0420 03/20/24 1336  NA  127* 127* 127* 130* 130*  K 4.4 3.6 3.3* 3.5 3.2*  CL 90* 90* 89* 91* 91*  CO2 23 24 24 23 24   GLUCOSE 159* 197* 151* 111* 182*  BUN 76* 75* 72* 73* 80*  CREATININE 2.81* 2.59* 2.61* 2.64* 2.65*  CALCIUM  9.5 9.4 9.2 9.2 9.5  MG 2.3  --   --   --   --   PHOS 4.7*  --   --   --   --      CBC: Recent Labs  Lab 03/19/24 1038 03/20/24 0420  WBC 6.1 5.5  HGB 12.9 12.0  HCT 42.0 38.3  MCV 70.5* 68.8*  PLT 313 281      Lab Results  Component Value Date   HEPBSAG NON REACTIVE 04/11/2021   HEPBSAB NON REACTIVE 04/11/2021   HEPBIGM NON REACTIVE 04/11/2021      Microbiology:  No results found for this or any previous visit (from the past 240 hours).  Coagulation Studies: No results for input(s): "LABPROT", "INR" in the last 72 hours.  Urinalysis: No results for input(s): "COLORURINE", "LABSPEC", "PHURINE", "GLUCOSEU", "HGBUR", "BILIRUBINUR", "KETONESUR", "PROTEINUR", "UROBILINOGEN", "NITRITE", "LEUKOCYTESUR" in the last 72 hours.  Invalid input(s): "APPERANCEUR"    Imaging: US  ASCITES (ABDOMEN LIMITED) Result Date: 03/20/2024 CLINICAL DATA:  92834 Cirrhosis (HCC) 92834 EXAM: LIMITED ABDOMEN ULTRASOUND FOR ASCITES TECHNIQUE: Limited ultrasound survey for ascites was performed  in all four abdominal quadrants. COMPARISON:  August 01, 2021. FINDINGS: Targeted ultrasound performed of the 4 quadrants. No significant ascites is documented. Lobular and heterogeneous appearance of the liver consistent with history of cirrhosis. IMPRESSION: No significant ascites. Electronically Signed   By: Clancy Crimes M.D.   On: 03/20/2024 14:35   ECHOCARDIOGRAM COMPLETE Result Date: 03/20/2024    ECHOCARDIOGRAM REPORT   Patient Name:   Karen Farley Date of Exam: 03/20/2024 Medical Rec #:  [id number]          Height:       60.0 in Accession #:    [id number]         Weight:       150.1 lb Date of Birth:  02-10-1946           BSA:          1.652 m Patient Age:    77 years           BP:            100/63 mmHg Patient Gender: F                  HR:           75 bpm. Exam Location:  [facility] Procedure: 2D Echo (Both Spectral and Color Flow Doppler were utilized during            procedure). Indications:     CHF I50.31  History:         Patient has prior history of Echocardiogram examinations, most                  recent 11/14/2021.  Sonographer:     Clenton Czech RDCS, FASE Referring Phys:  [id number] XILIN NIU Diagnosing Phys: Maudine Sos MD IMPRESSIONS  1. Intracavitary gradient. Peak velocity 2.31 m/s. Peak gradient 21.3 mmHg. Left ventricular ejection fraction, by estimation, is 60 to 65%. The left ventricle has normal function. The left ventricle has no regional wall motion abnormalities. There is moderate concentric left ventricular hypertrophy.  2. Right ventricular systolic function is normal. The right ventricular size is normal. There is normal pulmonary artery systolic pressure.  3. Mostly lateral to the RV. There is also echogenic material in the pericardium lateral to the RV apex. This is visualized on prior echo as well. May represent fat pad. However, it may also represent evidence of prior hemopericardium. a small pericardial effusion is present. There is no evidence of cardiac tamponade.  4. Mobile MAC on the posterior mitral valve annulus. The mitral valve is normal in structure. Mild mitral valve regurgitation. Mild mitral stenosis. The mean mitral valve gradient is 3.5 mmHg with average heart rate of 79 bpm. Severe mitral annular calcification.  5. The aortic valve is normal in structure. There is severe calcifcation of the aortic valve. There is severe thickening of the aortic valve. Aortic valve regurgitation is not visualized. No aortic stenosis is present. Aortic valve area, by VTI measures  1.10 cm. Aortic valve mean gradient measures 13.8 mmHg. Aortic valve Vmax measures 2.57 m/s.  6. The inferior vena cava is normal in size with greater than 50% respiratory variability, suggesting  right atrial pressure of 3 mmHg. FINDINGS  Left Ventricle: Intracavitary gradient. Peak velocity 2.31 m/s. Peak gradient 21.3 mmHg. Left ventricular ejection fraction, by estimation, is 60 to 65%. The left ventricle has normal function. The left ventricle has no regional wall motion abnormalities. The left ventricular internal cavity size was normal  in size. There is moderate concentric left ventricular hypertrophy. Indeterminate filling pressures. Right Ventricle: The right ventricular size is normal. No increase in right ventricular wall thickness. Right ventricular systolic function is normal. There is normal pulmonary artery systolic pressure. The tricuspid regurgitant velocity is 1.62 m/s, and  with an assumed right atrial pressure of 3 mmHg, the estimated right ventricular systolic pressure is 13.5 mmHg. Left Atrium: Left atrial size was normal in size. Right Atrium: Right atrial size was normal in size. Pericardium: Mostly lateral to the RV. There is also echogenic material in the pericardium lateral to the RV apex. This is visualized on prior echo as well. May represent fat pad. However, it may also represent evidence of prior hemopericardium. A small pericardial effusion is present. There is no evidence of cardiac tamponade. Mitral Valve: Mobile MAC on the posterior mitral valve annulus. The mitral valve is normal in structure. Severe mitral annular calcification. Mild mitral valve regurgitation. Mild mitral valve stenosis. MV peak gradient, 11.7 mmHg. The mean mitral valve gradient is 3.5 mmHg with average heart rate of 79 bpm. Tricuspid Valve: The tricuspid valve is normal in structure. Tricuspid valve regurgitation is mild . No evidence of tricuspid stenosis. Aortic Valve: The aortic valve is normal in structure. There is severe calcifcation of the aortic valve. There is severe thickening of the aortic valve. Aortic valve regurgitation is not visualized. No aortic stenosis is present. Aortic valve mean  gradient measures 13.8 mmHg. Aortic valve peak gradient measures 26.5 mmHg. Aortic valve area, by VTI measures 1.10 cm. Pulmonic Valve: The pulmonic valve was normal in structure. Pulmonic valve regurgitation is not visualized. No evidence of pulmonic stenosis. Aorta: The aortic root is normal in size and structure. Venous: The inferior vena cava is normal in size with greater than 50% respiratory variability, suggesting right atrial pressure of 3 mmHg. IAS/Shunts: No atrial level shunt detected by color flow Doppler.  LEFT VENTRICLE PLAX 2D LVIDd:         3.50 cm   Diastology LVIDs:         2.40 cm   LV e' medial:    10.70 cm/s LV PW:         1.40 cm   LV E/e' medial:  12.0 LV IVS:        1.30 cm   LV e' lateral:   10.60 cm/s LVOT diam:     1.80 cm   LV E/e' lateral: 12.1 LV SV:         46 LV SV Index:   28 LVOT Area:     2.54 cm  RIGHT VENTRICLE RV Basal diam:  2.40 cm RV S prime:     8.05 cm/s TAPSE (M-mode): 1.5 cm LEFT ATRIUM             Index        RIGHT ATRIUM           Index LA diam:        5.40 cm 3.27 cm/m   RA Area:     13.50 cm LA Vol (A2C):   23.6 ml 14.28 ml/m  RA Volume:   29.10 ml  17.61 ml/m LA Vol (A4C):   34.0 ml 20.58 ml/m LA Biplane Vol: 29.8 ml 18.03 ml/m  AORTIC VALVE                     PULMONIC VALVE AV Area (Vmax):    0.98 cm      PV  Vmax:       0.69 m/s AV Area (Vmean):   1.03 cm      PV Peak grad:  1.9 mmHg AV Area (VTI):     1.10 cm AV Vmax:           257.25 cm/s AV Vmean:          168.500 cm/s AV VTI:            0.420 m AV Peak Grad:      26.5 mmHg AV Mean Grad:      13.8 mmHg LVOT Vmax:         99.20 cm/s LVOT Vmean:        68.000 cm/s LVOT VTI:          0.182 m LVOT/AV VTI ratio: 0.43  AORTA Ao Root diam: 2.60 cm Ao Asc diam:  2.90 cm MITRAL VALVE                TRICUSPID VALVE MV Area (PHT): 3.26 cm     TR Peak grad:   10.5 mmHg MV Area VTI:   1.32 cm     TR Vmax:        162.00 cm/s MV Peak grad:  11.7 mmHg MV Mean grad:  3.5 mmHg     SHUNTS MV Vmax:       1.71 m/s      Systemic VTI:  0.18 m MV Vmean:      86.4 cm/s    Systemic Diam: 1.80 cm MV Decel Time: 233 msec MV E velocity: 128.00 cm/s Maudine Sos MD Electronically signed by Maudine Sos MD Signature Date/Time: 03/20/2024/11:55:45 AM    Final (Updated)    CT CHEST WO CONTRAST Result Date: 03/19/2024 CLINICAL DATA:  Respiratory illness shortness of breath EXAM: CT CHEST WITHOUT CONTRAST TECHNIQUE: Multidetector CT imaging of the chest was performed following the standard protocol without IV contrast. RADIATION DOSE REDUCTION: This exam was performed according to the departmental dose-optimization program which includes automated exposure control, adjustment of the mA and/or kV according to patient size and/or use of iterative reconstruction technique. COMPARISON:  Chest x-ray 03/19/2024 FINDINGS: Cardiovascular: Limited evaluation without intravenous contrast. Mild aortic atherosclerosis. No aneurysm. Coronary vascular calcification. Borderline cardiomegaly. Mild pericardial thickening versus small volume fluid. Trace foci of gas in the right atrium presumably due to venous access. Mitral calcifications. Aortic valvular calcifications. Mediastinum/Nodes: Patent trachea. No suspicious thyroid  mass. Subcentimeter mediastinal lymph nodes. Esophagus shows mild distal circumferential thickening. Lungs/Pleura: No acute airspace disease, pleural effusion or pneumothorax Upper Abdomen: No acute finding.  Suspected liver cirrhosis Musculoskeletal: No acute or suspicious osseous abnormality. Multilevel degenerative changes IMPRESSION: 1. No CT evidence for acute intrathoracic abnormality. 2. Borderline cardiomegaly. Mild diffuse pericardial thickening versus small volume fluid. 3. Aortic valvular calcifications. 4. Mild distal circumferential thickening of the esophagus, question esophagitis or reflux. Nonemergent endoscopy evaluation suggested 5. 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 6. Aortic atherosclerosis. Aortic Atherosclerosis  (ICD10-I70.0). Electronically Signed   By: Esmeralda Hedge M.D.   On: 03/19/2024 17:57   DG Chest 2 View Result Date: 03/19/2024 CLINICAL DATA:  Shortness of breath EXAM: CHEST - 2 VIEW COMPARISON:  X-ray 12/18/2021 FINDINGS: Hyperinflation. Normal cardiopericardial silhouette. Calcified aorta. No consolidation, pneumothorax or effusion. No edema. Degenerative changes seen along the spine on lateral view. IMPRESSION: Hyperinflation.  No acute cardiopulmonary disease. Electronically Signed   By: Adrianna Horde M.D.   On: 03/19/2024 12:09     Medications:     allopurinol   100 mg  Oral Daily   apixaban   5 mg Oral BID   folic acid   1 mg Oral Daily   insulin  aspart  0-5 Units Subcutaneous QHS   insulin  aspart  0-9 Units Subcutaneous TID WC   insulin  glargine-yfgn  10 Units Subcutaneous QHS   latanoprost   1 drop Both Eyes QHS   metoprolol  succinate  12.5 mg Oral Daily   midodrine   10 mg Oral TID   pantoprazole   40 mg Oral BID   [START ON 03/26/2024] rosuvastatin   5 mg Oral Weekly   sodium chloride   1 g Oral BID WC   spironolactone   50 mg Oral Daily   acetaminophen , albuterol , dextromethorphan-guaiFENesin , hydrALAZINE , ondansetron  (ZOFRAN ) IV  Assessment/ Plan:  78 y.o. female with  multiple medical problems including chronic kidney disease, hypertension, atrial fibrillation, hyperlipidemia, chronic lower extremity edema, diastolic dysfunction, diabetes, osteoarthritis of the knee, chronic back pain  admitted on 03/19/2024 for Shortness of breath [R06.02] Acute on chronic diastolic heart failure (HCC) [I50.33] Acute on chronic diastolic CHF (congestive heart failure) (HCC) [I50.33]  1.  Acute on chronic diastolic CHF, with worsening lower extremity edema and shortness of breath 2.  Acute kidney injury on chronic kidney disease stage IV.  Baseline creatinine 2.6/GFR 19 from April 2025. Creatinine has worsened to 2.65, likely secondary to aggressive diuresis. 3.  Hypokalemia 4.   Hyponatremia.  Plan: Agree with IV furosemide  for diuresis and symptom control. Added 1 dose of metolazone  today for additional diuretic effect. Agree with midodrine  to prevent hypotension. Continue spironolactone  as part of CHF regimen. Patient will need additional potassium supplementation to correct hypokalemia.      LOS: 1 Karen Farley 5/31/20253:10 PM  [facility] [address] [place], [place] [phone]  Note: This note was prepared with Dragon dictation. Any transcription errors are unintentional

## 2024-03-20 NOTE — Progress Notes (Addendum)
 PROGRESS NOTE    Karen Farley  ZOX:[id number] DOB: Dec 08, 1945 DOA: 03/19/2024 PCP: Solomon Dupre, DO   Assessment & Plan:   Principal Problem:   Acute on chronic diastolic heart failure (HCC) Active Problems:   Myocardial injury   Hyponatremia   Acute renal failure superimposed on stage 3b chronic kidney disease (HCC)   HTN (hypertension)   Atrial fibrillation, chronic (HCC)   Hyperlipidemia   Type 2 diabetes mellitus with renal complication (HCC)   Overweight (BMI 25.0-29.9)   Acute on chronic diastolic CHF (congestive heart failure) (HCC)  Assessment and Plan: Acute on chronic diastolic CHF: echo 11/14/2021 showed EF 60 to 65%.  Patient has ST B, worsening leg edema, 15 pounds weight gain, elevated BNP 265, clinically consistent with CHF exacerbation. Continue on IV lasix  and home dose of aldactone . Monitor I/Os. Fluid restriction. Cardio consulted   Persist pericardial effusion: small as seen on echo. Management as per cardio    Myocardial injury: very minimal elevation.  No chest pain, likely due to demand ischemia secondary to CHF exacerbation.   Hyponatremia: likely hypervolemic hyponatremia. Trending up today   Possible cirrhosis: as per CT. No hx of alcohol abuse. Likely contributing to fluid overload. Will check hepatitis panel. Will need to f/u outpatient w/ GI    AKI on CKDIIIb: Baseline Cr 1.99. Cr is labile. Avoid nephrotoxic meds. Nephro consulted     HTN: continue on metoprolol , aldactone     Chronic a. fib: continue on home dose of metoprolol , eliquis     GERD: continue on PPI. Will need to f/u outpatient w/ GI    HLD: continue on statin    DM2:  poorly controlled. Continue on glargine, SSI w/ accuchecks   Overweight: BMI 29.3. Would benefit from weight loss       DVT prophylaxis: eliquis  Code Status: full  Family Communication:  Disposition Plan: likely d/c back home  Status is: Inpatient Remains inpatient appropriate because: severity of  illness  .  Level of care: Telemetry Cardiac Consultants:  Nephro Cardio   Procedures:   Antimicrobials:   Subjective: Pt c/o swelling of abd, legs & feet   Objective: Vitals:   03/19/24 2318 03/20/24 0400 03/20/24 0500 03/20/24 0802  BP: 96/72 100/63  108/81  Pulse: 74 78  71  Resp: 18   14  Temp: (!) 97.5 F (36.4 C) 98 F (36.7 C)  97.9 F (36.6 C)  TempSrc:  Oral    SpO2: 98% 99%  99%  Weight:   68.1 kg   Height:       No intake or output data in the 24 hours ending 03/20/24 0836 Filed Weights   03/19/24 1034 03/19/24 2016 03/20/24 0500  Weight: 69.4 kg 69.9 kg 68.1 kg    Examination:  General exam: Appears calm bu uncomfortable  Respiratory system: decreased breath sounds b/l  Cardiovascular system: S1 & S2 +. No rubs, gallops or clicks. B/l LE edema   Gastrointestinal system: Abdomen is distended, soft and nontender. Hypoactive bowel sounds heard. Central nervous system: Alert and oriented. Moves all extremities Psychiatry: Judgement and insight appear normal. Mood & affect appropriate.     Data Reviewed: I have personally reviewed following labs and imaging studies  CBC: Recent Labs  Lab 03/19/24 1038 03/20/24 0420  WBC 6.1 5.5  HGB 12.9 12.0  HCT 42.0 38.3  MCV 70.5* 68.8*  PLT 313 281   Basic Metabolic Panel: Recent Labs  Lab 03/19/24 1038 03/19/24 2009 03/19/24 2224 03/20/24 0420  NA 127* 127* 127* 130*  K 4.4 3.6 3.3* 3.5  CL 90* 90* 89* 91*  CO2 23 24 24 23   GLUCOSE 159* 197* 151* 111*  BUN 76* 75* 72* 73*  CREATININE 2.81* 2.59* 2.61* 2.64*  CALCIUM  9.5 9.4 9.2 9.2  MG 2.3  --   --   --   PHOS 4.7*  --   --   --    GFR: Estimated Creatinine Clearance: 15.4 mL/min (A) (by C-G formula based on SCr of 2.64 mg/dL (H)). Liver Function Tests: Recent Labs  Lab 03/19/24 1038  AST 19  ALT 16  ALKPHOS 88  BILITOT 2.1*  PROT 6.3*  ALBUMIN  3.8   No results for input(s): "LIPASE", "AMYLASE" in the last 168 hours. No results  for input(s): "AMMONIA" in the last 168 hours. Coagulation Profile: No results for input(s): "INR", "PROTIME" in the last 168 hours. Cardiac Enzymes: No results for input(s): "CKTOTAL", "CKMB", "CKMBINDEX", "TROPONINI" in the last 168 hours. BNP (last 3 results) No results for input(s): "PROBNP" in the last 8760 hours. HbA1C: No results for input(s): "HGBA1C" in the last 72 hours. CBG: Recent Labs  Lab 03/19/24 2137 03/20/24 0757  GLUCAP 163* 130*   Lipid Profile: No results for input(s): "CHOL", "HDL", "LDLCALC", "TRIG", "CHOLHDL", "LDLDIRECT" in the last 72 hours. Thyroid  Function Tests: No results for input(s): "TSH", "T4TOTAL", "FREET4", "T3FREE", "THYROIDAB" in the last 72 hours. Anemia Panel: No results for input(s): "VITAMINB12", "FOLATE", "FERRITIN", "TIBC", "IRON ", "RETICCTPCT" in the last 72 hours. Sepsis Labs: No results for input(s): "PROCALCITON", "LATICACIDVEN" in the last 168 hours.  No results found for this or any previous visit (from the past 240 hours).       Radiology Studies: CT CHEST WO CONTRAST Result Date: 03/19/2024 CLINICAL DATA:  Respiratory illness shortness of breath EXAM: CT CHEST WITHOUT CONTRAST TECHNIQUE: Multidetector CT imaging of the chest was performed following the standard protocol without IV contrast. RADIATION DOSE REDUCTION: This exam was performed according to the departmental dose-optimization program which includes automated exposure control, adjustment of the mA and/or kV according to patient size and/or use of iterative reconstruction technique. COMPARISON:  Chest x-ray 03/19/2024 FINDINGS: Cardiovascular: Limited evaluation without intravenous contrast. Mild aortic atherosclerosis. No aneurysm. Coronary vascular calcification. Borderline cardiomegaly. Mild pericardial thickening versus small volume fluid. Trace foci of gas in the right atrium presumably due to venous access. Mitral calcifications. Aortic valvular calcifications.  Mediastinum/Nodes: Patent trachea. No suspicious thyroid  mass. Subcentimeter mediastinal lymph nodes. Esophagus shows mild distal circumferential thickening. Lungs/Pleura: No acute airspace disease, pleural effusion or pneumothorax Upper Abdomen: No acute finding.  Suspected liver cirrhosis Musculoskeletal: No acute or suspicious osseous abnormality. Multilevel degenerative changes IMPRESSION: 1. No CT evidence for acute intrathoracic abnormality. 2. Borderline cardiomegaly. Mild diffuse pericardial thickening versus small volume fluid. 3. Aortic valvular calcifications. 4. Mild distal circumferential thickening of the esophagus, question esophagitis or reflux. Nonemergent endoscopy evaluation suggested 5. 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 6. Aortic atherosclerosis. Aortic Atherosclerosis (ICD10-I70.0). Electronically Signed   By: Esmeralda Hedge M.D.   On: 03/19/2024 17:57   DG Chest 2 View Result Date: 03/19/2024 CLINICAL DATA:  Shortness of breath EXAM: CHEST - 2 VIEW COMPARISON:  X-ray 12/18/2021 FINDINGS: Hyperinflation. Normal cardiopericardial silhouette. Calcified aorta. No consolidation, pneumothorax or effusion. No edema. Degenerative changes seen along the spine on lateral view. IMPRESSION: Hyperinflation.  No acute cardiopulmonary disease. Electronically Signed   By: Adrianna Horde M.D.   On: 03/19/2024 12:09        Scheduled Meds:  allopurinol   100 mg Oral Daily   apixaban   5 mg Oral BID   folic acid   1 mg Oral Daily   insulin  aspart  0-5 Units Subcutaneous QHS   insulin  aspart  0-9 Units Subcutaneous TID WC   insulin  glargine-yfgn  10 Units Subcutaneous QHS   latanoprost   1 drop Both Eyes QHS   metoprolol  succinate  12.5 mg Oral Daily   midodrine   10 mg Oral TID   pantoprazole   40 mg Oral BID   [START ON 03/26/2024] rosuvastatin   5 mg Oral Weekly   sodium chloride   1 g Oral BID WC   spironolactone   50 mg Oral Daily   Continuous Infusions:   LOS: 1 day      Alphonsus Jeans,  MD [facility] Pager [phone]  If 7PM-7AM, please contact night-coverage [email or web address] 03/20/2024, 8:36 AM

## 2024-03-20 NOTE — Plan of Care (Signed)

## 2024-03-20 NOTE — Consult Note (Signed)
 Cardiology Consultation   Patient ID: JAKERIA CAISSIE MRN: [id number]; DOB: 20-Jul-1946  Admit date: 03/19/2024 Date of Consult: 03/20/2024  PCP:  Solomon Dupre, DO   [facility] Providers Cardiologist:  Belva Boyden, MD   {  Patient Profile: Karen Farley is a 78 y.o. female with a hx of HFpEF, hypotension on midodrine , h/o pericardial effusion s/p pericardiocentesis, CKD stage 3, permanent Afib, HLD, lymphedema, h/o GIB, and diabetes who is being seen 03/20/2024 for the evaluation of CHF at the request of Dr. Broadus Canes.  History of Present Illness: Karen Farley is followed by Dr. Gollan for the above cardiac issues.   She as admitted to [facility] in early 07/2021 with DOE found to have large left pleural effusion requiring thoracentesis. In October she had rectal bleeding and Eliquis  was discontinued. She required PRBCs. CT showed large pericardial effusion. Echo confirmed large pericardial effusion without tamponade. Pericardiocentesis was performed yielding . Eliquis  was later resumed.   The patient follows with nephrology and was told to stop metolazone  and midodrine  in February 2025. She saw Dr. Gollan 02/24/24 and weight was up 145>158. Recommended metolazone  3 days a week and torsemide  was increased.   The patient presented to the ER 03/19/24 for SOB, abdominal fullness and lower leg edema. Reports 15lbs weight gain from her baseline. She was doubling torsemide  three times a week and taking metolazone  2.5mg  once. She was having trouble walking from room to room in her condo.   In the ER BP 108/86, pulse 79bpm, RR 18, 1F. Labs showed sosium 127, BG 159, Scr 2.81, BUN 76. HS trop 26>25. BNP 265. EKG showed Afib, 80bpm. CXR with no focal consolidation or edema. Chest CT showed borderline cardiomegaly, mild diffuse pericardial thickening vs small volume fluid, aortic valvular calcifications, esophagitis or reflux.    Past Medical History:  Diagnosis Date   A-fib [facility])     CHF (congestive heart failure) (HCC)    Chronic heart failure with preserved ejection fraction (HFpEF) (HCC)    a. 11/2019 Echo: EF 60-65%; b. 07/2021 Echo: EF 60-65%.   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 07/2021 req 2u prbcs. Eval deferred given resolution of bleeding and cardiac issues.   Hyperlipidemia    Hypertension    Lymphedema    Morbid obesity (HCC)    Osteoarthritis of both knees    Osteopenia    Pericardial effusion    a. 07/2021 Echo: Nl EF. Large pericardial effusion-->s/p pericardiocentesis of ; b. 08/23/2021 Echo: EF 60-65%, mild LVH, Nl RV fxn, sev dil LA, moderate pericardial effusion. L pleural effusion.   Permanent atrial fibrillation (HCC)    Pleural effusion, left    a. 07/2021 s/p thoracentesis - .    Past Surgical History:  Procedure Laterality Date   BASAL CELL CARCINOMA EXCISION  11/01/2022   CATARACT EXTRACTION W/PHACO Left 10/09/2021   Procedure: CATARACT EXTRACTION PHACO AND INTRAOCULAR LENS PLACEMENT (IOC) LEFT DIABETIC 4.84 00:37.8;  Surgeon: Clair Crews, MD;  Location: [facility];  Service: Ophthalmology;  Laterality: Left;  Diabetic   CATARACT EXTRACTION W/PHACO Right 10/30/2021   Procedure: CATARACT EXTRACTION PHACO AND INTRAOCULAR LENS PLACEMENT (IOC) RIGHT DIABETIC 5.65 00:38.5;  Surgeon: Clair Crews, MD;  Location: [facility];  Service: Ophthalmology;  Laterality: Right;  Diabetic   CHOLECYSTECTOMY     DILATION AND CURETTAGE OF UTERUS     IR FLUORO GUIDE CV LINE RIGHT  08/16/2021   IR THORACENTESIS ASP PLEURAL SPACE W/IMG GUIDE  08/16/2021   PERICARDIOCENTESIS N/A 08/05/2021   Procedure: PERICARDIOCENTESIS;  Surgeon: Percival Brace, MD;  Location: [facility] INVASIVE CV LAB;  Service: Cardiovascular;   Laterality: N/A;   TEAR DUCT PROBING WITH STRABISMUS REPAIR Right    TONSILLECTOMY       Home Medications:  Prior to Admission medications   Medication Sig Start Date End Date Taking? Authorizing Provider  allopurinol  (ZYLOPRIM ) 100 MG tablet Take 1 tablet (100 mg total) by mouth daily. 12/19/23  Yes Johnson, [name], DO  apixaban  (ELIQUIS ) 5 MG TABS tablet Take 1 tablet (5 mg total) by mouth 2 (two) times daily. 12/19/23  Yes Johnson, [name], DO  empagliflozin  (JARDIANCE ) 25 MG TABS tablet TAKE 1 TABLET BY MOUTH ONCE DAILY BEFOREBREAKFAST 12/19/23  Yes Johnson, [name], DO  folic acid  (FOLVITE ) 1 MG tablet Take 1 tablet (1 mg total) by mouth daily. 09/30/23  Yes Johnson, [name], DO  insulin  glargine, 2 Unit Dial, (TOUJEO  MAX SOLOSTAR) 300 UNIT/ML Solostar Pen Inject 16-20 Units into the skin daily. 12/19/23 09/14/24 Yes Johnson, [name], DO  latanoprost  (XALATAN ) 0.005 % ophthalmic solution Place 1 drop into both eyes at bedtime.   Yes [provider]  metolazone  (ZAROXOLYN ) 2.5 MG tablet Take 2.5 mg by mouth once a week. Wednesday   Yes [provider]  metoprolol  succinate (TOPROL -XL) 25 MG 24 hr tablet Take 0.5 tablets (12.5 mg total) by mouth daily. 12/19/23  Yes Johnson, [name], DO  midodrine  (PROAMATINE ) 10 MG tablet TAKE 1 TABLET BY MOUTH 3 TIMES DAILY WITH MEALS 02/24/23  Yes Gollan, Timothy J, MD  pantoprazole  (PROTONIX ) 40 MG tablet Take 1 tablet (40 mg total) by mouth 2 (two) times daily. 12/19/23  Yes Johnson, [name], DO  potassium chloride  SA (KLOR-CON  M) 20 MEQ tablet Take 2 tablets (40 mEq total) by mouth 2 (two) times daily. 12/19/23  Yes Johnson, [name], DO  rosuvastatin  (CRESTOR ) 5 MG tablet Take 1 tablet (5 mg total) by mouth once a week. 05/30/23  Yes Johnson, [name], DO  Semaglutide  (RYBELSUS ) 7 MG TABS Take 1 tablet (7 mg total) by mouth daily. 12/19/23  Yes Johnson, [name], DO  spironolactone  (ALDACTONE ) 50 MG tablet Take 1 tablet (50 mg total) by mouth  daily. 12/19/23  Yes Johnson, [name], DO  torsemide  (DEMADEX ) 20 MG tablet TAKE 1 TO 2 TABLETS BY MOUTH DAILY. ONLY TAKE 2 TABLETS FOR 1 TO 3 DAYS AND IF PERSISTS, CALL WITH BAD SWELLING 12/19/23  Yes Johnson, [name], DO  Continuous Glucose Sensor (DEXCOM G7 SENSOR) MISC by Does not apply route.    [provider]  Glucagon  (GVOKE HYPOPEN  2-PACK) 0.5 MG/0.1ML SOAJ Inject 1 each into the skin daily as needed. Patient not taking: Reported on 03/19/2024 03/14/23   Terre Ferri P, DO  Insulin  Pen Needle (PEN NEEDLES) 32G X 4 MM MISC 1 each by Does not apply route 3 (three) times daily. Patient not taking: Reported on 02/24/2024 01/30/23   Terre Ferri P, DO  Insulin  Pen Needle (ULTRACARE PEN NEEDLES) 32G X 4 MM MISC USE 1 PEN ONCE DAILY Patient not taking: Reported on 02/24/2024 10/11/22   Terre Ferri P, DO  Insulin  Syringe-Needle U-100 (INSULIN  SYRINGE 1CC/31GX5/16") 31G X 5/16" 1 ML MISC 1  each by Does not apply route 3 (three) times daily. Patient not taking: Reported on 02/24/2024 12/13/22   Terre Ferri P, DO  Insulin  Syringe/Needle U-500 (BD INSULIN  SYRINGE U-500) 31G X 0.5 ML MISC 1 each by Does not apply route 3 (three) times daily as needed. Patient not taking: Reported on 02/24/2024 03/14/23   Terre Ferri P, DO  metroNIDAZOLE (METROGEL) 1 % gel Apply to face every morning. Patient not taking: Reported on 03/19/2024 09/19/22   [provider]  NEEDLE, DISP, 30 G (BD DISP NEEDLES) 30G X 1/2" MISC by Does not apply route. Patient not taking: Reported on 02/24/2024    [provider]    Scheduled Meds:  allopurinol   100 mg Oral Daily   apixaban   5 mg Oral BID   folic acid   1 mg Oral Daily   furosemide   40 mg Intravenous Once   insulin  aspart  0-5 Units Subcutaneous QHS   insulin  aspart  0-9 Units Subcutaneous TID WC   insulin  glargine-yfgn  10 Units Subcutaneous QHS   latanoprost   1 drop Both Eyes QHS   metoprolol  succinate  12.5 mg Oral Daily   midodrine   10  mg Oral TID   pantoprazole   40 mg Oral BID   [START ON 03/26/2024] rosuvastatin   5 mg Oral Weekly   sodium chloride   1 g Oral BID WC   spironolactone   50 mg Oral Daily   Continuous Infusions:  PRN Meds: acetaminophen , albuterol , dextromethorphan-guaiFENesin , hydrALAZINE , ondansetron  (ZOFRAN ) IV  Allergies:   No Known Allergies  Social History:   Social History   Socioeconomic History   Marital status: Married    Spouse name: Not on file   Number of children: Not on file   Years of education: Not on file   Highest education level: Not on file  Occupational History   Not on file  Tobacco Use   Smoking status: Never   Smokeless tobacco: Never  Vaping Use   Vaping status: Never Used  Substance and Sexual Activity   Alcohol use: Not Currently    Alcohol/week: 0.0 standard drinks of alcohol    Comment: on rare occasion   Drug use: No   Sexual activity: Not Currently  Other Topics Concern   Not on file  Social History Narrative   Not on file   Social Drivers of Health   Financial Resource Strain: Low Risk  (07/23/2022)   Overall Financial Resource Strain (CARDIA)    Difficulty of Paying Living Expenses: Not hard at all  Food Insecurity: No Food Insecurity (03/19/2024)   Hunger Vital Sign    Worried About Running Out of Food in the Last Year: Never true    Ran Out of Food in the Last Year: Never true  Transportation Needs: No Transportation Needs (03/19/2024)   PRAPARE - Administrator, Civil Service (Medical): No    Lack of Transportation (Non-Medical): No  Physical Activity: Sufficiently Active (07/23/2022)   Exercise Vital Sign    Days of Exercise per Week: 3 days    Minutes of Exercise per Session: 60 min  Stress: No Stress Concern Present (07/23/2022)   [employer] of Occupational Health - Occupational Stress Questionnaire    Feeling of Stress : Not at all  Social Connections: Unknown (03/19/2024)   Social Connection and Isolation Panel [NHANES]     Frequency of Communication with Friends and Family: Twice a week    Frequency of Social Gatherings with Friends and Family: Once a  week    Attends Religious Services: Not on file    Active Member of Clubs or Organizations: Not on file    Attends Club or Organization Meetings: Not on file    Marital Status: Married  Intimate Partner Violence: Unknown (03/19/2024)   Humiliation, Afraid, Rape, and Kick questionnaire    Fear of Current or Ex-Partner: No    Emotionally Abused: No    Physically Abused: Not on file    Sexually Abused: No    Family History:    Family History  Problem Relation Age of Onset   Heart disease Mother        CHF   Osteoporosis Mother    Breast cancer Mother    Heart disease Father 89   Cancer Brother        Colon CA- 2002- Youngest brother   Parkinson's disease Brother        Younger Brother   Heart disease Brother        2 brothers   Diabetes Brother    Cancer Brother    Breast cancer Maternal Grandmother      ROS:  Please see the history of present illness.   All other ROS reviewed and negative.     Physical Exam/Data: Vitals:   03/20/24 0400 03/20/24 0500 03/20/24 0802 03/20/24 0915  BP: 100/63  108/81 108/81  Pulse: 78  71 71  Resp:   14   Temp: 98 F (36.7 C)  97.9 F (36.6 C)   TempSrc: Oral     SpO2: 99%  99%   Weight:  68.1 kg    Height:       No intake or output data in the 24 hours ending 03/20/24 1027    03/20/2024    5:00 AM 03/19/2024    8:16 PM 03/19/2024   10:34 AM  Last 3 Weights  Weight (lbs) 150 lb 2.1 oz 154 lb 1.6 oz 153 lb  Weight (kg) 68.1 kg 69.9 kg 69.4 kg     Body mass index is 29.32 kg/m.  General:  Well nourished, well developed, in no acute distress HEENT: normal Neck: no JVD Vascular: No carotid bruits; Distal pulses 2+ bilaterally Cardiac:  normal S1, S2; RRR; no murmur  Lungs:  clear to auscultation bilaterally, no wheezing, rhonchi or rales  Abd: fullness  Ext: 1+ lower leg edema Musculoskeletal:   No deformities, BUE and BLE strength normal and equal Skin: warm and dry  Neuro:  CNs 2-12 intact, no focal abnormalities noted Psych:  Normal affect   EKG:  The EKG was personally reviewed and demonstrates:  Afib 80bpm Telemetry:  Telemetry was personally reviewed and demonstrates:  Afib rates 80s  Relevant CV Studies:  Echo limited 10/2021 1. Left ventricular ejection fraction, by estimation, is 60 to 65%. The  left ventricle has normal function. The left ventricle has no regional  wall motion abnormalities. There is mild left ventricular hypertrophy.  Left ventricular diastolic parameters  are indeterminate.   2. Right ventricular systolic function is normal. The right ventricular  size is normal.   3. A small to moderate sized pericardial effusion is present, 1.1 Cm  circumferential, pocket of 2.5 cm off the LV free wall. No tamponade  present.   4. The mitral valve is normal in structure. Moderate mitral valve  regurgitation. No evidence of mitral stenosis. Moderate mitral annular  calcification.   5. Aortic valve has moderate calcification of the aortic valve. Aortic  valve regurgitation is not visualized.Degree  of stenosis not measured.   6. The inferior vena cava is normal in size with greater than 50%  respiratory variability, suggesting right atrial pressure of 3 mmHg.   7.   Comparison(s): Moderate pericardial effusion. Largest 1.9 cm LV free wall,  Moderate MR.   Limited Echo 08/2021   1. Left pleural effusion noted, 5 cm   2. Moderate pericardial effusion, estimated 1.9 off the LV free wall,  minimal off the RV free wall (0.4 cm) . There is no evidence of cardiac  tamponade.   3. Left ventricular ejection fraction, by estimation, is 60 to 65%. The  left ventricle has normal function. The left ventricle has no regional  wall motion abnormalities. There is mild left ventricular hypertrophy.   4. Right ventricular systolic function is normal. The right ventricular   size is normal.   5. Left atrial size was severely dilated.   6. The mitral valve is normal in structure. Moderate mitral valve  regurgitation. No evidence of mitral stenosis.   7. The aortic valve is normal in structure. Aortic valve regurgitation is  not visualized. Mild to moderate aortic valve sclerosis/calcification is  present, without any evidence of aortic stenosis.    Laboratory Data: High Sensitivity Troponin:   Recent Labs  Lab 03/19/24 1038 03/19/24 1643  TROPONINIHS 26* 25*     Chemistry Recent Labs  Lab 03/19/24 1038 03/19/24 2009 03/19/24 2224 03/20/24 0420  NA 127* 127* 127* 130*  K 4.4 3.6 3.3* 3.5  CL 90* 90* 89* 91*  CO2 23 24 24 23   GLUCOSE 159* 197* 151* 111*  BUN 76* 75* 72* 73*  CREATININE 2.81* 2.59* 2.61* 2.64*  CALCIUM  9.5 9.4 9.2 9.2  MG 2.3  --   --   --   GFRNONAA 17* 19* 18* 18*  ANIONGAP 14 13 14  16*    Recent Labs  Lab 03/19/24 1038  PROT 6.3*  ALBUMIN  3.8  AST 19  ALT 16  ALKPHOS 88  BILITOT 2.1*   Lipids No results for input(s): "CHOL", "TRIG", "HDL", "LABVLDL", "LDLCALC", "CHOLHDL" in the last 168 hours.  Hematology Recent Labs  Lab 03/19/24 1038 03/20/24 0420  WBC 6.1 5.5  RBC 5.96* 5.57*  HGB 12.9 12.0  HCT 42.0 38.3  MCV 70.5* 68.8*  MCH 21.6* 21.5*  MCHC 30.7 31.3  RDW 20.5* 20.1*  PLT 313 281   Thyroid  No results for input(s): "TSH", "FREET4" in the last 168 hours.  BNP Recent Labs  Lab 03/19/24 1038  BNP 265.4*    DDimer No results for input(s): "DDIMER" in the last 168 hours.  Radiology/Studies:  CT CHEST WO CONTRAST Result Date: 03/19/2024 CLINICAL DATA:  Respiratory illness shortness of breath EXAM: CT CHEST WITHOUT CONTRAST TECHNIQUE: Multidetector CT imaging of the chest was performed following the standard protocol without IV contrast. RADIATION DOSE REDUCTION: This exam was performed according to the departmental dose-optimization program which includes automated exposure control, adjustment of  the mA and/or kV according to patient size and/or use of iterative reconstruction technique. COMPARISON:  Chest x-ray 03/19/2024 FINDINGS: Cardiovascular: Limited evaluation without intravenous contrast. Mild aortic atherosclerosis. No aneurysm. Coronary vascular calcification. Borderline cardiomegaly. Mild pericardial thickening versus small volume fluid. Trace foci of gas in the right atrium presumably due to venous access. Mitral calcifications. Aortic valvular calcifications. Mediastinum/Nodes: Patent trachea. No suspicious thyroid  mass. Subcentimeter mediastinal lymph nodes. Esophagus shows mild distal circumferential thickening. Lungs/Pleura: No acute airspace disease, pleural effusion or pneumothorax Upper Abdomen: No acute finding.  Suspected liver cirrhosis Musculoskeletal: No acute or suspicious osseous abnormality. Multilevel degenerative changes IMPRESSION: 1. No CT evidence for acute intrathoracic abnormality. 2. Borderline cardiomegaly. Mild diffuse pericardial thickening versus small volume fluid. 3. Aortic valvular calcifications. 4. Mild distal circumferential thickening of the esophagus, question esophagitis or reflux. Nonemergent endoscopy evaluation suggested 5. 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 6. Aortic atherosclerosis. Aortic Atherosclerosis (ICD10-I70.0). Electronically Signed   By: Esmeralda Hedge M.D.   On: 03/19/2024 17:57   DG Chest 2 View Result Date: 03/19/2024 CLINICAL DATA:  Shortness of breath EXAM: CHEST - 2 VIEW COMPARISON:  X-ray 12/18/2021 FINDINGS: Hyperinflation. Normal cardiopericardial silhouette. Calcified aorta. No consolidation, pneumothorax or effusion. No edema. Degenerative changes seen along the spine on lateral view. IMPRESSION: Hyperinflation.  No acute cardiopulmonary disease. Electronically Signed   By: Adrianna Horde M.D.   On: 03/19/2024 12:09     Assessment and Plan:  Acute on chronic diastolic heart failure - presented with worsening shortness of breath, LLE,  abdominal fullness and 15 lbs weight gain - BNP 265, Na 127, Scr 2.81>2.64 - the patient has been having issue with volume since metolazone  and midodrine  were stopped in 2/20245 by nephrology. She saw Dr Jerelene Monday 5/6 and reported 15lbs weight gain and SOB, it was recommended she take metolazone  2.5mg  TID and double torsemide  as needed. She had decreased metolazone  to once weekly.  - IV lasix  40mg  daily with minimal urine output>will increase to IV lasix  40mg  BID - spironolactone  50mg  daily - I/Os not recorded - echo ordered. Prior echo in2023 showed LVEF 60-65%, midl LVH, mod MR - suspect patient needs to take metolazone  at least three times weekly along with Torsemide  at d/c  Elevated troponin - HS trop 26>25 - suspect supply demand mismatch  Hyponatremia - NA 127 - in the setting of volume overload - s/p sodium chloride  tablet, up to 130  AKI on CKD stage 3 - Scr 1.99 baseline - Scr 2.81 on admission, down to 2.64 - daily BMET  Hypotension - midodrine  10mg  TID - BP good  Permanent Afib - continue metoprolol  and Eliquis    For questions or updates, please contact [facility] Please consult [email or web address] for contact info under    Signed, Lennon Richins Rebekah Canada, PA-C  03/20/2024 10:27 AM

## 2024-03-20 NOTE — Progress Notes (Signed)
  Echocardiogram 2D Echocardiogram has been performed.  Nichola Barges 03/20/2024, 9:18 AM

## 2024-03-21 DIAGNOSIS — N1832 Chronic kidney disease, stage 3b: Secondary | ICD-10-CM | POA: Diagnosis not present

## 2024-03-21 DIAGNOSIS — N179 Acute kidney failure, unspecified: Secondary | ICD-10-CM | POA: Diagnosis not present

## 2024-03-21 DIAGNOSIS — E871 Hypo-osmolality and hyponatremia: Secondary | ICD-10-CM | POA: Diagnosis not present

## 2024-03-21 DIAGNOSIS — I5033 Acute on chronic diastolic (congestive) heart failure: Secondary | ICD-10-CM | POA: Diagnosis not present

## 2024-03-21 LAB — HEPATITIS PANEL, ACUTE
HCV Ab: NONREACTIVE
Hep A IgM: NONREACTIVE
Hep B C IgM: NONREACTIVE
Hepatitis B Surface Ag: NONREACTIVE

## 2024-03-21 LAB — CBC
HCT: 39.8 % (ref 36.0–46.0)
Hemoglobin: 12.6 g/dL (ref 12.0–15.0)
MCH: 21.8 pg — ABNORMAL LOW (ref 26.0–34.0)
MCHC: 31.7 g/dL (ref 30.0–36.0)
MCV: 69 fL — ABNORMAL LOW (ref 80.0–100.0)
Platelets: 298 10*3/uL (ref 150–400)
RBC: 5.77 MIL/uL — ABNORMAL HIGH (ref 3.87–5.11)
RDW: 20.2 % — ABNORMAL HIGH (ref 11.5–15.5)
WBC: 5.3 10*3/uL (ref 4.0–10.5)
nRBC: 0 % (ref 0.0–0.2)

## 2024-03-21 LAB — BASIC METABOLIC PANEL WITH GFR
Anion gap: 17 — ABNORMAL HIGH (ref 5–15)
BUN: 78 mg/dL — ABNORMAL HIGH (ref 8–23)
CO2: 23 mmol/L (ref 22–32)
Calcium: 9.4 mg/dL (ref 8.9–10.3)
Chloride: 88 mmol/L — ABNORMAL LOW (ref 98–111)
Creatinine, Ser: 2.5 mg/dL — ABNORMAL HIGH (ref 0.44–1.00)
GFR, Estimated: 19 mL/min — ABNORMAL LOW (ref >60)
Glucose, Bld: 124 mg/dL — ABNORMAL HIGH (ref 70–99)
Potassium: 3.2 mmol/L — ABNORMAL LOW (ref 3.5–5.1)
Sodium: 128 mmol/L — ABNORMAL LOW (ref 135–145)

## 2024-03-21 LAB — GLUCOSE, CAPILLARY
Glucose-Capillary: 139 mg/dL — ABNORMAL HIGH (ref 70–99)
Glucose-Capillary: 266 mg/dL — ABNORMAL HIGH (ref 70–99)
Glucose-Capillary: 146 mg/dL — ABNORMAL HIGH (ref 70–99)
Glucose-Capillary: 193 mg/dL — ABNORMAL HIGH (ref 70–99)

## 2024-03-21 MED ORDER — TRAZODONE HCL 50 MG PO TABS: 50.0000 mg | ORAL_TABLET | Freq: Every day | ORAL | Status: DC

## 2024-03-21 MED ORDER — TRAZODONE HCL 50 MG PO TABS
50.0000 mg | ORAL_TABLET | Freq: Every day | ORAL | Status: AC
  Administered 2024-03-21: 50 mg via ORAL
  Filled 2024-03-21: qty 1

## 2024-03-21 MED ORDER — FUROSEMIDE 10 MG/ML IJ SOLN
60.0000 mg | Freq: Once | INTRAMUSCULAR | Status: AC
  Administered 2024-03-21: 60 mg via INTRAVENOUS
  Filled 2024-03-21: qty 6

## 2024-03-21 NOTE — Progress Notes (Signed)
 [facility] [place], [place] 03/21/24  Subjective:   Hospital day # 2   Patient known to our practice from outpatient follow-up of CKD.  She presented to the emergency room with worsening shortness of breath with exertion.  Normally she is able to drive and carry on activities of daily living with ease.  For the last 2 to 3 weeks and especially last 4 days she has been getting short of breath and having to rest even to go from 1 room to the next.  She has developed worsening lower extremity edema.  She is now presented to the hospital for further management.  She is currently receiving IV diuresis with furosemide .  Currently on room air.   Lower extremity edema appears to have improved some.  Patient reports no bowel movement for 3 to 4 days. Otherwise appetite is fair. Today's weight is 68.7 kg.  Renal: 05/31 0701 - 06/01 0700 In: 960 [P.O.:960] Out: 500 [Urine:500] Lab Results  Component Value Date   CREATININE 2.50 (H) 03/21/2024   CREATININE 2.58 (H) 03/20/2024   CREATININE 2.65 (H) 03/20/2024     Objective:  Vital signs in last 24 hours:  Temp:  [97.6 F (36.4 C)-98.3 F (36.8 C)] 97.7 F (36.5 C) (06/01 0817) Pulse Rate:  [64-83] 71 (06/01 0817) Resp:  [16-20] 18 (06/01 0329) BP: (87-113)/(61-76) 91/73 (06/01 0817) SpO2:  [96 %-99 %] 98 % (06/01 0817) Weight:  [68.7 kg] 68.7 kg (06/01 0500)  Weight change: -0.7 kg Filed Weights   03/19/24 2016 03/20/24 0500 03/21/24 0500  Weight: 69.9 kg 68.1 kg 68.7 kg    Intake/Output:    Intake/Output Summary (Last 24 hours) at 03/21/2024 1129 Last data filed at 03/21/2024 1035 Gross per 24 hour  Intake 1070 ml  Output 500 ml  Net 570 ml     Physical Exam: General: No acute distress, sitting in the chair  HEENT Moist mucous membranes  Pulm/lungs Normal breathing effort on room air  CVS/Heart Irregular rhythm  Abdomen:  Mildly distended  Extremities: 2+ pitting edema and lymphedema  Neurologic:  Alert and oriented  Skin: No acute rashes          Basic Metabolic Panel:  Recent Labs  Lab 03/19/24 1038 03/19/24 2009 03/19/24 2224 03/20/24 0420 03/20/24 1336 03/20/24 2117 03/21/24 0954  NA 127*   < > 127* 130* 130* 129* 128*  K 4.4   < > 3.3* 3.5 3.2* 3.4* 3.2*  CL 90*   < > 89* 91* 91* 89* 88*  CO2 23   < > 24 23 24 24 23   GLUCOSE 159*   < > 151* 111* 182* 128* 124*  BUN 76*   < > 72* 73* 80* 83* 78*  CREATININE 2.81*   < > 2.61* 2.64* 2.65* 2.58* 2.50*  CALCIUM  9.5   < > 9.2 9.2 9.5 9.5 9.4  MG 2.3  --   --   --   --   --   --   PHOS 4.7*  --   --   --   --   --   --    < > = values in this interval not displayed.     CBC: Recent Labs  Lab 03/19/24 1038 03/20/24 0420 03/21/24 0954  WBC 6.1 5.5 5.3  HGB 12.9 12.0 12.6  HCT 42.0 38.3 39.8  MCV 70.5* 68.8* 69.0*  PLT 313 281 298      Lab Results  Component Value Date  HEPBSAG NON REACTIVE 03/21/2024   HEPBSAB NON REACTIVE 04/11/2021   HEPBIGM NON REACTIVE 03/21/2024      Microbiology:  No results found for this or any previous visit (from the past 240 hours).  Coagulation Studies: No results for input(s): "LABPROT", "INR" in the last 72 hours.  Urinalysis: No results for input(s): "COLORURINE", "LABSPEC", "PHURINE", "GLUCOSEU", "HGBUR", "BILIRUBINUR", "KETONESUR", "PROTEINUR", "UROBILINOGEN", "NITRITE", "LEUKOCYTESUR" in the last 72 hours.  Invalid input(s): "APPERANCEUR"    Imaging: US  ASCITES (ABDOMEN LIMITED) Result Date: 03/20/2024 CLINICAL DATA:  [id number] Cirrhosis (HCC) 92834 EXAM: LIMITED ABDOMEN ULTRASOUND FOR ASCITES TECHNIQUE: Limited ultrasound survey for ascites was performed in all four abdominal quadrants. COMPARISON:  August 01, 2021. FINDINGS: Targeted ultrasound performed of the 4 quadrants. No significant ascites is documented. Lobular and heterogeneous appearance of the liver consistent with history of cirrhosis. IMPRESSION: No significant ascites. Electronically Signed   By:  Clancy Crimes M.D.   On: 03/20/2024 14:35   ECHOCARDIOGRAM COMPLETE Result Date: 03/20/2024    ECHOCARDIOGRAM REPORT   Patient Name:   LORENDA GRECCO Date of Exam: 03/20/2024 Medical Rec #:  [id number]          Height:       60.0 in Accession #:    [id number]         Weight:       150.1 lb Date of Birth:  December 14, 1945           BSA:          1.652 m Patient Age:    77 years           BP:           100/63 mmHg Patient Gender: F                  HR:           75 bpm. Exam Location:  [facility] Procedure: 2D Echo (Both Spectral and Color Flow Doppler were utilized during            procedure). Indications:     CHF I50.31  History:         Patient has prior history of Echocardiogram examinations, most                  recent 11/14/2021.  Sonographer:     Clenton Czech RDCS, FASE Referring Phys:  [id number] XILIN NIU Diagnosing Phys: Maudine Sos MD IMPRESSIONS  1. Intracavitary gradient. Peak velocity 2.31 m/s. Peak gradient 21.3 mmHg. Left ventricular ejection fraction, by estimation, is 60 to 65%. The left ventricle has normal function. The left ventricle has no regional wall motion abnormalities. There is moderate concentric left ventricular hypertrophy.  2. Right ventricular systolic function is normal. The right ventricular size is normal. There is normal pulmonary artery systolic pressure.  3. Mostly lateral to the RV. There is also echogenic material in the pericardium lateral to the RV apex. This is visualized on prior echo as well. May represent fat pad. However, it may also represent evidence of prior hemopericardium. a small pericardial effusion is present. There is no evidence of cardiac tamponade.  4. Mobile MAC on the posterior mitral valve annulus. The mitral valve is normal in structure. Mild mitral valve regurgitation. Mild mitral stenosis. The mean mitral valve gradient is 3.5 mmHg with average heart rate of 79 bpm. Severe mitral annular calcification.  5. The aortic valve is normal in structure.  There is severe calcifcation of the aortic valve. There is  severe thickening of the aortic valve. Aortic valve regurgitation is not visualized. No aortic stenosis is present. Aortic valve area, by VTI measures  1.10 cm. Aortic valve mean gradient measures 13.8 mmHg. Aortic valve Vmax measures 2.57 m/s.  6. The inferior vena cava is normal in size with greater than 50% respiratory variability, suggesting right atrial pressure of 3 mmHg. FINDINGS  Left Ventricle: Intracavitary gradient. Peak velocity 2.31 m/s. Peak gradient 21.3 mmHg. Left ventricular ejection fraction, by estimation, is 60 to 65%. The left ventricle has normal function. The left ventricle has no regional wall motion abnormalities. The left ventricular internal cavity size was normal in size. There is moderate concentric left ventricular hypertrophy. Indeterminate filling pressures. Right Ventricle: The right ventricular size is normal. No increase in right ventricular wall thickness. Right ventricular systolic function is normal. There is normal pulmonary artery systolic pressure. The tricuspid regurgitant velocity is 1.62 m/s, and  with an assumed right atrial pressure of 3 mmHg, the estimated right ventricular systolic pressure is 13.5 mmHg. Left Atrium: Left atrial size was normal in size. Right Atrium: Right atrial size was normal in size. Pericardium: Mostly lateral to the RV. There is also echogenic material in the pericardium lateral to the RV apex. This is visualized on prior echo as well. May represent fat pad. However, it may also represent evidence of prior hemopericardium. A small pericardial effusion is present. There is no evidence of cardiac tamponade. Mitral Valve: Mobile MAC on the posterior mitral valve annulus. The mitral valve is normal in structure. Severe mitral annular calcification. Mild mitral valve regurgitation. Mild mitral valve stenosis. MV peak gradient, 11.7 mmHg. The mean mitral valve gradient is 3.5 mmHg with average  heart rate of 79 bpm. Tricuspid Valve: The tricuspid valve is normal in structure. Tricuspid valve regurgitation is mild . No evidence of tricuspid stenosis. Aortic Valve: The aortic valve is normal in structure. There is severe calcifcation of the aortic valve. There is severe thickening of the aortic valve. Aortic valve regurgitation is not visualized. No aortic stenosis is present. Aortic valve mean gradient measures 13.8 mmHg. Aortic valve peak gradient measures 26.5 mmHg. Aortic valve area, by VTI measures 1.10 cm. Pulmonic Valve: The pulmonic valve was normal in structure. Pulmonic valve regurgitation is not visualized. No evidence of pulmonic stenosis. Aorta: The aortic root is normal in size and structure. Venous: The inferior vena cava is normal in size with greater than 50% respiratory variability, suggesting right atrial pressure of 3 mmHg. IAS/Shunts: No atrial level shunt detected by color flow Doppler.  LEFT VENTRICLE PLAX 2D LVIDd:         3.50 cm   Diastology LVIDs:         2.40 cm   LV e' medial:    10.70 cm/s LV PW:         1.40 cm   LV E/e' medial:  12.0 LV IVS:        1.30 cm   LV e' lateral:   10.60 cm/s LVOT diam:     1.80 cm   LV E/e' lateral: 12.1 LV SV:         46 LV SV Index:   28 LVOT Area:     2.54 cm  RIGHT VENTRICLE RV Basal diam:  2.40 cm RV S prime:     8.05 cm/s TAPSE (M-mode): 1.5 cm LEFT ATRIUM             Index        RIGHT  ATRIUM           Index LA diam:        5.40 cm 3.27 cm/m   RA Area:     13.50 cm LA Vol (A2C):   23.6 ml 14.28 ml/m  RA Volume:   29.10 ml  17.61 ml/m LA Vol (A4C):   34.0 ml 20.58 ml/m LA Biplane Vol: 29.8 ml 18.03 ml/m  AORTIC VALVE                     PULMONIC VALVE AV Area (Vmax):    0.98 cm      PV Vmax:       0.69 m/s AV Area (Vmean):   1.03 cm      PV Peak grad:  1.9 mmHg AV Area (VTI):     1.10 cm AV Vmax:           257.25 cm/s AV Vmean:          168.500 cm/s AV VTI:            0.420 m AV Peak Grad:      26.5 mmHg AV Mean Grad:      13.8 mmHg  LVOT Vmax:         99.20 cm/s LVOT Vmean:        68.000 cm/s LVOT VTI:          0.182 m LVOT/AV VTI ratio: 0.43  AORTA Ao Root diam: 2.60 cm Ao Asc diam:  2.90 cm MITRAL VALVE                TRICUSPID VALVE MV Area (PHT): 3.26 cm     TR Peak grad:   10.5 mmHg MV Area VTI:   1.32 cm     TR Vmax:        162.00 cm/s MV Peak grad:  11.7 mmHg MV Mean grad:  3.5 mmHg     SHUNTS MV Vmax:       1.71 m/s     Systemic VTI:  0.18 m MV Vmean:      86.4 cm/s    Systemic Diam: 1.80 cm MV Decel Time: 233 msec MV E velocity: 128.00 cm/s Maudine Sos MD Electronically signed by Maudine Sos MD Signature Date/Time: 03/20/2024/11:55:45 AM    Final (Updated)    CT CHEST WO CONTRAST Result Date: 03/19/2024 CLINICAL DATA:  Respiratory illness shortness of breath EXAM: CT CHEST WITHOUT CONTRAST TECHNIQUE: Multidetector CT imaging of the chest was performed following the standard protocol without IV contrast. RADIATION DOSE REDUCTION: This exam was performed according to the departmental dose-optimization program which includes automated exposure control, adjustment of the mA and/or kV according to patient size and/or use of iterative reconstruction technique. COMPARISON:  Chest x-ray 03/19/2024 FINDINGS: Cardiovascular: Limited evaluation without intravenous contrast. Mild aortic atherosclerosis. No aneurysm. Coronary vascular calcification. Borderline cardiomegaly. Mild pericardial thickening versus small volume fluid. Trace foci of gas in the right atrium presumably due to venous access. Mitral calcifications. Aortic valvular calcifications. Mediastinum/Nodes: Patent trachea. No suspicious thyroid  mass. Subcentimeter mediastinal lymph nodes. Esophagus shows mild distal circumferential thickening. Lungs/Pleura: No acute airspace disease, pleural effusion or pneumothorax Upper Abdomen: No acute finding.  Suspected liver cirrhosis Musculoskeletal: No acute or suspicious osseous abnormality. Multilevel degenerative changes  IMPRESSION: 1. No CT evidence for acute intrathoracic abnormality. 2. Borderline cardiomegaly. Mild diffuse pericardial thickening versus small volume fluid. 3. Aortic valvular calcifications. 4. Mild distal circumferential thickening of the esophagus, question esophagitis or reflux.  Nonemergent endoscopy evaluation suggested 5. 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 6. Aortic atherosclerosis. Aortic Atherosclerosis (ICD10-I70.0). Electronically Signed   By: Esmeralda Hedge M.D.   On: 03/19/2024 17:57     Medications:     allopurinol   100 mg Oral Daily   apixaban   5 mg Oral BID   folic acid   1 mg Oral Daily   furosemide   60 mg Intravenous Once   insulin  aspart  0-5 Units Subcutaneous QHS   insulin  aspart  0-9 Units Subcutaneous TID WC   insulin  glargine-yfgn  10 Units Subcutaneous QHS   latanoprost   1 drop Both Eyes QHS   metoprolol  succinate  12.5 mg Oral Daily   midodrine   10 mg Oral TID   pantoprazole   40 mg Oral BID   potassium chloride   10 mEq Oral TID   [START ON 03/26/2024] rosuvastatin   5 mg Oral Weekly   spironolactone   50 mg Oral Daily   traZODone  50 mg Oral QHS   acetaminophen , albuterol , dextromethorphan-guaiFENesin , hydrALAZINE , ondansetron  (ZOFRAN ) IV, polyethylene glycol  Assessment/ Plan:  78 y.o. female with  multiple medical problems including chronic kidney disease, hypertension, atrial fibrillation, hyperlipidemia, chronic lower extremity edema, diastolic dysfunction, diabetes, osteoarthritis of the knee, chronic back pain  admitted on 03/19/2024 for Shortness of breath [R06.02] Acute on chronic diastolic heart failure (HCC) [I50.33] Acute on chronic diastolic CHF (congestive heart failure) (HCC) [I50.33]  1.  Acute on chronic diastolic CHF, with worsening lower extremity edema and shortness of breath 2.  Acute kidney injury on chronic kidney disease stage IV.  Baseline creatinine 2.6/GFR 19 from April 2025. Creatinine has worsened to 2.65, likely secondary to aggressive  diuresis. 3.  Hypokalemia 4.  Hyponatremia. 5.  Diabetes type 2 with CKD.  Hemoglobin A1c 7.4% from February 2025.  2D echo 03/20/2024-LVEF 60 to 65%, moderate concentric LVH, normal right ventricular systolic function small pericardial effusion, normal aortic valve, severe mitral annular calcification  Plan: Agree with IV furosemide  for diuresis and symptom control. Given 1 dose of metolazone  on Saturday 2.5 mg for additional diuretic effect. Agree with midodrine  to prevent hypotension. Continue spironolactone  as part of CHF regimen. Continue potassium supplementation. Will check magnesium  with tomorrow morning's lab.     LOS: 2 Karen Farley Karen Farley 6/1/202511:29 AM  [facility] [place], [place] [phone]  Note: This note was prepared with Dragon dictation. Any transcription errors are unintentional

## 2024-03-21 NOTE — Progress Notes (Signed)
  Progress Note  Patient Name: Karen Farley Date of Encounter: 03/21/2024 [facility] Cardiologist: Belva Boyden, MD   Interval Summary    Patient is unsure if breathing is better. Still with some puffiness and swelling on her feet. No chest pain reported. BP soft.   Vital Signs Vitals:   03/20/24 1941 03/21/24 0005 03/21/24 0329 03/21/24 0500  BP: 99/62 104/68 (!) 87/61   Pulse: 64 69 69   Resp: 20 16 18    Temp: 97.6 F (36.4 C) 97.9 F (36.6 C) 97.9 F (36.6 C)   TempSrc: Oral Oral Oral   SpO2: 97% 96% 97%   Weight:    68.7 kg  Height:        Intake/Output Summary (Last 24 hours) at 03/21/2024 0719 Last data filed at 03/20/2024 2100 Gross per 24 hour  Intake 960 ml  Output 500 ml  Net 460 ml      03/21/2024    5:00 AM 03/20/2024    5:00 AM 03/19/2024    8:16 PM  Last 3 Weights  Weight (lbs) 151 lb 7.3 oz 150 lb 2.1 oz 154 lb 1.6 oz  Weight (kg) 68.7 kg 68.1 kg 69.9 kg      Telemetry/ECG  Afib HR 60s - Personally Reviewed  Physical Exam  GEN: No acute distress.   Neck: No JVD Cardiac: RRR, + murmur, no rubs, or gallops.  Respiratory: Clear to auscultation bilaterally. GI: Soft, nontender, non-distended  MS: No edema  Assessment & Plan   Acute on chronic diastolic heart failure - presented with worsening shortness of breath, LLE, abdominal fullness and 15 lbs weight gain - BNP 265, Na 127, Scr 2.81>2.64>2.58 - the patient has been having issue with volume since metolazone  and midodrine  were stopped in 2/20245 by nephrology. She saw Dr Jerelene Monday 5/6 and reported 15lbs weight gain and SOB, it was recommended she take metolazone  2.5mg  TID and double torsemide  as needed. She had decreased metolazone  to once weekly.  - IV lasix  40mg  daily with minimal urine output>held for nephrology input - continue spironolactone  50mg  daily - UOP - -  Prior echo [id number] showed LVEF 60-65%, midl LVH, mod MR - Echo this admission showed LVEF 60-65%, no WMA, mild  LVH, normal RVSF, small pericardial effusion, no tamponade - Unclear reason for SOB, may need RHC. nephrology recommended IV lasix .  I will give 1 time dose of VI lasix  60mg    Elevated troponin - HS trop 26>25 - suspect supply demand mismatch  Abnormal Echo Pericardial effusion - h/o pericardial effusion - echo this admission showed small effusion with no tamponade, echogenic material in the pericardium, may be fat pad (seen on prior echocardiograms), mobile MAS on the posterior mitral velve annulus, mild MR, mid MS, severe aortic valve calcification - continue OP monitoring   Hyponatremia - NA 127>130>29 - in the setting of volume overload - s/p sodium chloride  tablet per nephrology held   AKI on CKD stage 3 - Scr 1.99 baseline - Scr 2.81 on admission, down to 2.64 - AKI due ? Aggressive diuresis,  - nephrology recommended IV lasix  daily - daily BMET   Hypotension - midodrine  10mg  TID - BP soft    For questions or updates, please contact  [facility] Please consult [email or web address] for contact info under       Signed, Ismerai Bin Rebekah Canada, PA-C

## 2024-03-21 NOTE — Plan of Care (Signed)

## 2024-03-21 NOTE — Progress Notes (Signed)
 PROGRESS NOTE    Karen Farley  EAV:[id number] DOB: [birth year] DOA: 03/19/2024 PCP: Solomon Dupre, DO   Assessment & Plan:   Principal Problem:   Acute on chronic diastolic heart failure (HCC) Active Problems:   Myocardial injury   Hyponatremia   Acute renal failure superimposed on stage 3b chronic kidney disease (HCC)   HTN (hypertension)   Atrial fibrillation, chronic (HCC)   Hyperlipidemia   Type 2 diabetes mellitus with renal complication (HCC)   Overweight (BMI 25.0-29.9)   Acute on chronic diastolic CHF (congestive heart failure) (HCC)   Hypotension   Cirrhosis of liver with ascites (HCC)  Assessment and Plan: Acute on chronic diastolic CHF: echo 11/14/2021 showed EF 60 to 65%.  Has dyspnea, worsening leg edema, 15 pounds weight gain, clinically consistent with CHF exacerbation. Continue on IV lasix , aldactone . Monitor I/Os. Possibly go for RHC tomorrow as per cardio. Cardio following and recs apprec   Persist pericardial effusion: small as seen on echo. Management as per cardio    Myocardial injury: very minimal elevation.  No chest pain, likely due to demand ischemia secondary to CHF exacerbation.   Hyponatremia: likely hypervolemic hyponatremia. Labile   Possible cirrhosis: as per CT. No hx of alcohol abuse. Likely contributing to fluid overload. Hepatitis panel was neg. ? NASH. Will need to f/u outpatient w/ GI    AKI on CKDIIIb: Baseline Cr 1.99. Cr is trending down today. Nephro following and recs apprec     Hypokalemia: potassium ordered    HTN: continue on metoprolol , aldactone     Chronic a. fib: continue on eliquis , metoprolol    GERD: continue on PPI. Will need to f/u outpatient w/ GI    HLD: continue on statin    DM2:  poorly controlled. Continue on glargine, SSI w/ accuchecks    Overweight: BMI 29.3. Would benefit from weight loss       DVT prophylaxis: eliquis  Code Status: full  Family Communication:  Disposition Plan: likely d/c back  home  Status is: Inpatient Remains inpatient appropriate because: severity of illness  .  Level of care: Telemetry Cardiac Consultants:  Nephro Cardio   Procedures:   Antimicrobials:   Subjective: Pt c/o b/l leg swelling   Objective: Vitals:   03/21/24 0005 03/21/24 0329 03/21/24 0500 03/21/24 0817  BP: 104/68 (!) 87/61  91/73  Pulse: 69 69  71  Resp: 16 18    Temp: 97.9 F (36.6 C) 97.9 F (36.6 C)  97.7 F (36.5 C)  TempSrc: Oral Oral  Oral  SpO2: 96% 97%  98%  Weight:   68.7 kg   Height:        Intake/Output Summary (Last 24 hours) at 03/21/2024 0818 Last data filed at 03/20/2024 2100 Gross per 24 hour  Intake 830 ml  Output 500 ml  Net 330 ml   Filed Weights   03/19/24 2016 03/20/24 0500 03/21/24 0500  Weight: 69.9 kg 68.1 kg 68.7 kg    Examination:  General exam: appears comfortable Respiratory system: diminished breath sounds b/l. No wheezes Cardiovascular system: irregularly irregular Gastrointestinal system: abd is soft, NT, ND & hypoactive bowel sounds Central nervous system: alert & oriented. Moves all extremities  Psychiatry: Judgement and insight appears at baseline. Appropriate mood and affect     Data Reviewed: I have personally reviewed following labs and imaging studies  CBC: Recent Labs  Lab 03/19/24 1038 03/20/24 0420  WBC 6.1 5.5  HGB 12.9 12.0  HCT 42.0 38.3  MCV 70.5* 68.8*  PLT 313 281   Basic Metabolic Panel: Recent Labs  Lab 03/19/24 1038 03/19/24 2009 03/19/24 2224 03/20/24 0420 03/20/24 1336 03/20/24 2117  NA 127* 127* 127* 130* 130* 129*  K 4.4 3.6 3.3* 3.5 3.2* 3.4*  CL 90* 90* 89* 91* 91* 89*  CO2 23 24 24 23 24 24   GLUCOSE 159* 197* 151* 111* 182* 128*  BUN 76* 75* 72* 73* 80* 83*  CREATININE 2.81* 2.59* 2.61* 2.64* 2.65* 2.58*  CALCIUM  9.5 9.4 9.2 9.2 9.5 9.5  MG 2.3  --   --   --   --   --   PHOS 4.7*  --   --   --   --   --    GFR: Estimated Creatinine Clearance: 15.8 mL/min (A) (by C-G formula  based on SCr of 2.58 mg/dL (H)). Liver Function Tests: Recent Labs  Lab 03/19/24 1038  AST 19  ALT 16  ALKPHOS 88  BILITOT 2.1*  PROT 6.3*  ALBUMIN  3.8   No results for input(s): "LIPASE", "AMYLASE" in the last 168 hours. No results for input(s): "AMMONIA" in the last 168 hours. Coagulation Profile: No results for input(s): "INR", "PROTIME" in the last 168 hours. Cardiac Enzymes: No results for input(s): "CKTOTAL", "CKMB", "CKMBINDEX", "TROPONINI" in the last 168 hours. BNP (last 3 results) No results for input(s): "PROBNP" in the last 8760 hours. HbA1C: No results for input(s): "HGBA1C" in the last 72 hours. CBG: Recent Labs  Lab 03/20/24 0757 03/20/24 1207 03/20/24 1626 03/20/24 2114 03/21/24 0813  GLUCAP 130* 204* 194* 146* 139*   Lipid Profile: No results for input(s): "CHOL", "HDL", "LDLCALC", "TRIG", "CHOLHDL", "LDLDIRECT" in the last 72 hours. Thyroid  Function Tests: No results for input(s): "TSH", "T4TOTAL", "FREET4", "T3FREE", "THYROIDAB" in the last 72 hours. Anemia Panel: No results for input(s): "VITAMINB12", "FOLATE", "FERRITIN", "TIBC", "IRON ", "RETICCTPCT" in the last 72 hours. Sepsis Labs: No results for input(s): "PROCALCITON", "LATICACIDVEN" in the last 168 hours.  No results found for this or any previous visit (from the past 240 hours).       Radiology Studies: US  ASCITES (ABDOMEN LIMITED) Result Date: 03/20/2024 CLINICAL DATA:  [id number] Cirrhosis (HCC) 92834 EXAM: LIMITED ABDOMEN ULTRASOUND FOR ASCITES TECHNIQUE: Limited ultrasound survey for ascites was performed in all four abdominal quadrants. COMPARISON:  August 01, 2021. FINDINGS: Targeted ultrasound performed of the 4 quadrants. No significant ascites is documented. Lobular and heterogeneous appearance of the liver consistent with history of cirrhosis. IMPRESSION: No significant ascites. Electronically Signed   By: Clancy Crimes M.D.   On: 03/20/2024 14:35   ECHOCARDIOGRAM  COMPLETE Result Date: 03/20/2024    ECHOCARDIOGRAM REPORT   Patient Name:   Karen Farley Date of Exam: 03/20/2024 Medical Rec #:  [id number]          Height:       60.0 in Accession #:    [id number]         Weight:       150.1 lb Date of Birth:  August 30, 1946           BSA:          1.652 m Patient Age:    77 years           BP:           100/63 mmHg Patient Gender: F                  HR:           75  bpm. Exam Location:  [facility] Procedure: 2D Echo (Both Spectral and Color Flow Doppler were utilized during            procedure). Indications:     CHF I50.31  History:         Patient has prior history of Echocardiogram examinations, most                  recent 11/14/2021.  Sonographer:     Clenton Czech RDCS, FASE Referring Phys:  [id number] XILIN NIU Diagnosing Phys: Maudine Sos MD IMPRESSIONS  1. Intracavitary gradient. Peak velocity 2.31 m/s. Peak gradient 21.3 mmHg. Left ventricular ejection fraction, by estimation, is 60 to 65%. The left ventricle has normal function. The left ventricle has no regional wall motion abnormalities. There is moderate concentric left ventricular hypertrophy.  2. Right ventricular systolic function is normal. The right ventricular size is normal. There is normal pulmonary artery systolic pressure.  3. Mostly lateral to the RV. There is also echogenic material in the pericardium lateral to the RV apex. This is visualized on prior echo as well. May represent fat pad. However, it may also represent evidence of prior hemopericardium. a small pericardial effusion is present. There is no evidence of cardiac tamponade.  4. Mobile MAC on the posterior mitral valve annulus. The mitral valve is normal in structure. Mild mitral valve regurgitation. Mild mitral stenosis. The mean mitral valve gradient is 3.5 mmHg with average heart rate of 79 bpm. Severe mitral annular calcification.  5. The aortic valve is normal in structure. There is severe calcifcation of the aortic valve. There is severe  thickening of the aortic valve. Aortic valve regurgitation is not visualized. No aortic stenosis is present. Aortic valve area, by VTI measures  1.10 cm. Aortic valve mean gradient measures 13.8 mmHg. Aortic valve Vmax measures 2.57 m/s.  6. The inferior vena cava is normal in size with greater than 50% respiratory variability, suggesting right atrial pressure of 3 mmHg. FINDINGS  Left Ventricle: Intracavitary gradient. Peak velocity 2.31 m/s. Peak gradient 21.3 mmHg. Left ventricular ejection fraction, by estimation, is 60 to 65%. The left ventricle has normal function. The left ventricle has no regional wall motion abnormalities. The left ventricular internal cavity size was normal in size. There is moderate concentric left ventricular hypertrophy. Indeterminate filling pressures. Right Ventricle: The right ventricular size is normal. No increase in right ventricular wall thickness. Right ventricular systolic function is normal. There is normal pulmonary artery systolic pressure. The tricuspid regurgitant velocity is 1.62 m/s, and  with an assumed right atrial pressure of 3 mmHg, the estimated right ventricular systolic pressure is 13.5 mmHg. Left Atrium: Left atrial size was normal in size. Right Atrium: Right atrial size was normal in size. Pericardium: Mostly lateral to the RV. There is also echogenic material in the pericardium lateral to the RV apex. This is visualized on prior echo as well. May represent fat pad. However, it may also represent evidence of prior hemopericardium. A small pericardial effusion is present. There is no evidence of cardiac tamponade. Mitral Valve: Mobile MAC on the posterior mitral valve annulus. The mitral valve is normal in structure. Severe mitral annular calcification. Mild mitral valve regurgitation. Mild mitral valve stenosis. MV peak gradient, 11.7 mmHg. The mean mitral valve gradient is 3.5 mmHg with average heart rate of 79 bpm. Tricuspid Valve: The tricuspid valve is  normal in structure. Tricuspid valve regurgitation is mild . No evidence of tricuspid stenosis. Aortic Valve: The aortic valve is  normal in structure. There is severe calcifcation of the aortic valve. There is severe thickening of the aortic valve. Aortic valve regurgitation is not visualized. No aortic stenosis is present. Aortic valve mean gradient measures 13.8 mmHg. Aortic valve peak gradient measures 26.5 mmHg. Aortic valve area, by VTI measures 1.10 cm. Pulmonic Valve: The pulmonic valve was normal in structure. Pulmonic valve regurgitation is not visualized. No evidence of pulmonic stenosis. Aorta: The aortic root is normal in size and structure. Venous: The inferior vena cava is normal in size with greater than 50% respiratory variability, suggesting right atrial pressure of 3 mmHg. IAS/Shunts: No atrial level shunt detected by color flow Doppler.  LEFT VENTRICLE PLAX 2D LVIDd:         3.50 cm   Diastology LVIDs:         2.40 cm   LV e' medial:    10.70 cm/s LV PW:         1.40 cm   LV E/e' medial:  12.0 LV IVS:        1.30 cm   LV e' lateral:   10.60 cm/s LVOT diam:     1.80 cm   LV E/e' lateral: 12.1 LV SV:         46 LV SV Index:   28 LVOT Area:     2.54 cm  RIGHT VENTRICLE RV Basal diam:  2.40 cm RV S prime:     8.05 cm/s TAPSE (M-mode): 1.5 cm LEFT ATRIUM             Index        RIGHT ATRIUM           Index LA diam:        5.40 cm 3.27 cm/m   RA Area:     13.50 cm LA Vol (A2C):   23.6 ml 14.28 ml/m  RA Volume:   29.10 ml  17.61 ml/m LA Vol (A4C):   34.0 ml 20.58 ml/m LA Biplane Vol: 29.8 ml 18.03 ml/m  AORTIC VALVE                     PULMONIC VALVE AV Area (Vmax):    0.98 cm      PV Vmax:       0.69 m/s AV Area (Vmean):   1.03 cm      PV Peak grad:  1.9 mmHg AV Area (VTI):     1.10 cm AV Vmax:           257.25 cm/s AV Vmean:          168.500 cm/s AV VTI:            0.420 m AV Peak Grad:      26.5 mmHg AV Mean Grad:      13.8 mmHg LVOT Vmax:         99.20 cm/s LVOT Vmean:        68.000 cm/s  LVOT VTI:          0.182 m LVOT/AV VTI ratio: 0.43  AORTA Ao Root diam: 2.60 cm Ao Asc diam:  2.90 cm MITRAL VALVE                TRICUSPID VALVE MV Area (PHT): 3.26 cm     TR Peak grad:   10.5 mmHg MV Area VTI:   1.32 cm     TR Vmax:        162.00 cm/s MV Peak grad:  11.7  mmHg MV Mean grad:  3.5 mmHg     SHUNTS MV Vmax:       1.71 m/s     Systemic VTI:  0.18 m MV Vmean:      86.4 cm/s    Systemic Diam: 1.80 cm MV Decel Time: 233 msec MV E velocity: 128.00 cm/s Maudine Sos MD Electronically signed by Maudine Sos MD Signature Date/Time: 03/20/2024/11:55:45 AM    Final (Updated)    CT CHEST WO CONTRAST Result Date: 03/19/2024 CLINICAL DATA:  Respiratory illness shortness of breath EXAM: CT CHEST WITHOUT CONTRAST TECHNIQUE: Multidetector CT imaging of the chest was performed following the standard protocol without IV contrast. RADIATION DOSE REDUCTION: This exam was performed according to the departmental dose-optimization program which includes automated exposure control, adjustment of the mA and/or kV according to patient size and/or use of iterative reconstruction technique. COMPARISON:  Chest x-ray 03/19/2024 FINDINGS: Cardiovascular: Limited evaluation without intravenous contrast. Mild aortic atherosclerosis. No aneurysm. Coronary vascular calcification. Borderline cardiomegaly. Mild pericardial thickening versus small volume fluid. Trace foci of gas in the right atrium presumably due to venous access. Mitral calcifications. Aortic valvular calcifications. Mediastinum/Nodes: Patent trachea. No suspicious thyroid  mass. Subcentimeter mediastinal lymph nodes. Esophagus shows mild distal circumferential thickening. Lungs/Pleura: No acute airspace disease, pleural effusion or pneumothorax Upper Abdomen: No acute finding.  Suspected liver cirrhosis Musculoskeletal: No acute or suspicious osseous abnormality. Multilevel degenerative changes IMPRESSION: 1. No CT evidence for acute intrathoracic abnormality.  2. Borderline cardiomegaly. Mild diffuse pericardial thickening versus small volume fluid. 3. Aortic valvular calcifications. 4. Mild distal circumferential thickening of the esophagus, question esophagitis or reflux. Nonemergent endoscopy evaluation suggested 5. 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 6. Aortic atherosclerosis. Aortic Atherosclerosis (ICD10-I70.0). Electronically Signed   By: Esmeralda Hedge M.D.   On: 03/19/2024 17:57   DG Chest 2 View Result Date: 03/19/2024 CLINICAL DATA:  Shortness of breath EXAM: CHEST - 2 VIEW COMPARISON:  X-ray 12/18/2021 FINDINGS: Hyperinflation. Normal cardiopericardial silhouette. Calcified aorta. No consolidation, pneumothorax or effusion. No edema. Degenerative changes seen along the spine on lateral view. IMPRESSION: Hyperinflation.  No acute cardiopulmonary disease. Electronically Signed   By: Adrianna Horde M.D.   On: 03/19/2024 12:09        Scheduled Meds:  allopurinol   100 mg Oral Daily   apixaban   5 mg Oral BID   folic acid   1 mg Oral Daily   insulin  aspart  0-5 Units Subcutaneous QHS   insulin  aspart  0-9 Units Subcutaneous TID WC   insulin  glargine-yfgn  10 Units Subcutaneous QHS   latanoprost   1 drop Both Eyes QHS   metoprolol  succinate  12.5 mg Oral Daily   midodrine   10 mg Oral TID   pantoprazole   40 mg Oral BID   potassium chloride   10 mEq Oral TID   [START ON 03/26/2024] rosuvastatin   5 mg Oral Weekly   spironolactone   50 mg Oral Daily   Continuous Infusions:   LOS: 2 days      Alphonsus Jeans, MD [facility] Pager [phone]  If 7PM-7AM, please contact night-coverage [email or web address] 03/21/2024, 8:18 AM

## 2024-03-22 ENCOUNTER — Encounter: Payer: Self-pay | Admitting: Cardiovascular Disease

## 2024-03-22 ENCOUNTER — Encounter
Admission: EM | Disposition: A | Discharge status: Hospice | Payer: Self-pay | Source: Home / Self Care | Attending: Internal Medicine

## 2024-03-22 ENCOUNTER — Inpatient Hospital Stay

## 2024-03-22 ENCOUNTER — Other Ambulatory Visit: Payer: Self-pay

## 2024-03-22 DIAGNOSIS — E871 Hypo-osmolality and hyponatremia: Secondary | ICD-10-CM | POA: Diagnosis not present

## 2024-03-22 DIAGNOSIS — N1832 Chronic kidney disease, stage 3b: Secondary | ICD-10-CM | POA: Diagnosis not present

## 2024-03-22 DIAGNOSIS — I3139 Other pericardial effusion (noninflammatory): Secondary | ICD-10-CM | POA: Diagnosis not present

## 2024-03-22 DIAGNOSIS — I272 Pulmonary hypertension, unspecified: Secondary | ICD-10-CM

## 2024-03-22 DIAGNOSIS — I4891 Unspecified atrial fibrillation: Secondary | ICD-10-CM

## 2024-03-22 DIAGNOSIS — I5033 Acute on chronic diastolic (congestive) heart failure: Secondary | ICD-10-CM | POA: Diagnosis not present

## 2024-03-22 DIAGNOSIS — E119 Type 2 diabetes mellitus without complications: Secondary | ICD-10-CM

## 2024-03-22 DIAGNOSIS — N179 Acute kidney failure, unspecified: Secondary | ICD-10-CM | POA: Diagnosis not present

## 2024-03-22 DIAGNOSIS — N184 Chronic kidney disease, stage 4 (severe): Secondary | ICD-10-CM

## 2024-03-22 HISTORY — PX: RIGHT HEART CATH: CATH118263

## 2024-03-22 LAB — BASIC METABOLIC PANEL WITH GFR
Anion gap: 15 (ref 5–15)
Anion gap: 13 (ref 5–15)
BUN: 80 mg/dL — ABNORMAL HIGH (ref 8–23)
BUN: 76 mg/dL — ABNORMAL HIGH (ref 8–23)
CO2: 24 mmol/L (ref 22–32)
CO2: 26 mmol/L (ref 22–32)
Calcium: 9.6 mg/dL (ref 8.9–10.3)
Calcium: 9.7 mg/dL (ref 8.9–10.3)
Chloride: 85 mmol/L — ABNORMAL LOW (ref 98–111)
Chloride: 87 mmol/L — ABNORMAL LOW (ref 98–111)
Creatinine, Ser: 2.41 mg/dL — ABNORMAL HIGH (ref 0.44–1.00)
Creatinine, Ser: 2.43 mg/dL — ABNORMAL HIGH (ref 0.44–1.00)
GFR, Estimated: 20 mL/min — ABNORMAL LOW (ref >60)
GFR, Estimated: 20 mL/min — ABNORMAL LOW (ref >60)
Glucose, Bld: 158 mg/dL — ABNORMAL HIGH (ref 70–99)
Glucose, Bld: 140 mg/dL — ABNORMAL HIGH (ref 70–99)
Potassium: 2.7 mmol/L — CL (ref 3.5–5.1)
Potassium: 4.2 mmol/L (ref 3.5–5.1)
Sodium: 124 mmol/L — ABNORMAL LOW (ref 135–145)
Sodium: 126 mmol/L — ABNORMAL LOW (ref 135–145)

## 2024-03-22 LAB — CBC
HCT: 39.6 % (ref 36.0–46.0)
Hemoglobin: 12.3 g/dL (ref 12.0–15.0)
MCH: 21.4 pg — ABNORMAL LOW (ref 26.0–34.0)
MCHC: 31.1 g/dL (ref 30.0–36.0)
MCV: 68.9 fL — ABNORMAL LOW (ref 80.0–100.0)
Platelets: 269 10*3/uL (ref 150–400)
RBC: 5.75 MIL/uL — ABNORMAL HIGH (ref 3.87–5.11)
RDW: 20.2 % — ABNORMAL HIGH (ref 11.5–15.5)
WBC: 5.7 10*3/uL (ref 4.0–10.5)
nRBC: 0 % (ref 0.0–0.2)

## 2024-03-22 LAB — GLUCOSE, CAPILLARY
Glucose-Capillary: 156 mg/dL — ABNORMAL HIGH (ref 70–99)
Glucose-Capillary: 145 mg/dL — ABNORMAL HIGH (ref 70–99)
Glucose-Capillary: 133 mg/dL — ABNORMAL HIGH (ref 70–99)
Glucose-Capillary: 130 mg/dL — ABNORMAL HIGH (ref 70–99)
Glucose-Capillary: 163 mg/dL — ABNORMAL HIGH (ref 70–99)
Glucose-Capillary: 198 mg/dL — ABNORMAL HIGH (ref 70–99)

## 2024-03-22 LAB — POCT I-STAT EG7
Acid-Base Excess: 3 mmol/L — ABNORMAL HIGH (ref 0.0–2.0)
Bicarbonate: 26.9 mmol/L (ref 20.0–28.0)
Calcium, Ion: 1.17 mmol/L (ref 1.15–1.40)
HCT: 43 % (ref 36.0–46.0)
Hemoglobin: 14.6 g/dL (ref 12.0–15.0)
O2 Saturation: 54 %
Potassium: 3.8 mmol/L (ref 3.5–5.1)
Sodium: 127 mmol/L — ABNORMAL LOW (ref 135–145)
TCO2: 28 mmol/L (ref 22–32)
pCO2, Ven: 38.7 mmHg — ABNORMAL LOW (ref 44–60)
pH, Ven: 7.45 — ABNORMAL HIGH (ref 7.25–7.43)
pO2, Ven: 27 mmHg — CL (ref 32–45)

## 2024-03-22 LAB — MRSA NEXT GEN BY PCR, NASAL: MRSA by PCR Next Gen: NOT DETECTED

## 2024-03-22 LAB — MAGNESIUM: Magnesium: 2.8 mg/dL — ABNORMAL HIGH (ref 1.7–2.4)

## 2024-03-22 LAB — SODIUM, URINE, RANDOM: Sodium, Ur: 42 mmol/L

## 2024-03-22 SURGERY — RIGHT HEART CATH
Anesthesia: Moderate Sedation

## 2024-03-22 MED ORDER — HEPARIN (PORCINE) IN NACL 2000-0.9 UNIT/L-% IV SOLN
INTRAVENOUS | Status: DC | PRN
  Administered 2024-03-22: 1000 mL

## 2024-03-22 MED ORDER — METOLAZONE 5 MG PO TABS
5.0000 mg | ORAL_TABLET | Freq: Once | ORAL | Status: AC
  Administered 2024-03-22: 5 mg via ORAL
  Filled 2024-03-22: qty 1

## 2024-03-22 MED ORDER — SODIUM CHLORIDE 0.9 % IV SOLN: INTRAVENOUS | Status: DC

## 2024-03-22 MED ORDER — CHLORHEXIDINE GLUCONATE CLOTH 2 % EX PADS
6.0000 | MEDICATED_PAD | Freq: Every day | CUTANEOUS | Status: DC
  Administered 2024-03-22 – 2024-04-03 (×12): 6 via TOPICAL

## 2024-03-22 MED ORDER — TRAZODONE HCL 50 MG PO TABS: 50.0000 mg | ORAL_TABLET | Freq: Every evening | ORAL | Status: DC | PRN

## 2024-03-22 MED ORDER — POTASSIUM CHLORIDE 20 MEQ PO PACK
40.0000 meq | PACK | Freq: Once | ORAL | Status: DC
  Filled 2024-03-22: qty 2

## 2024-03-22 MED ORDER — FUROSEMIDE 10 MG/ML IJ SOLN: 60.0000 mg | Freq: Two times a day (BID) | INTRAMUSCULAR | Status: DC

## 2024-03-22 MED ORDER — SODIUM CHLORIDE 0.9% FLUSH: 3.0000 mL | INTRAVENOUS | Status: DC | PRN

## 2024-03-22 MED ORDER — FENTANYL CITRATE (PF) 100 MCG/2ML IJ SOLN
INTRAMUSCULAR | Status: AC
  Filled 2024-03-22: qty 2

## 2024-03-22 MED ORDER — SODIUM CHLORIDE 0.9% FLUSH
10.0000 mL | Freq: Two times a day (BID) | INTRAVENOUS | Status: DC
  Administered 2024-03-22: 30 mL
  Administered 2024-03-23: 10 mL
  Administered 2024-03-23: 20 mL
  Administered 2024-03-24 – 2024-03-25 (×3): 30 mL
  Administered 2024-03-25 – 2024-03-28 (×7): 10 mL
  Administered 2024-03-29: 40 mL
  Administered 2024-03-29 – 2024-04-01 (×8): 10 mL

## 2024-03-22 MED ORDER — MILRINONE LACTATE IN DEXTROSE 20-5 MG/100ML-% IV SOLN
0.2500 ug/kg/min | INTRAVENOUS | Status: DC
  Administered 2024-03-22 – 2024-03-29 (×11): 0.25 ug/kg/min via INTRAVENOUS
  Filled 2024-03-22 (×11): qty 100

## 2024-03-22 MED ORDER — POLYETHYLENE GLYCOL 3350 17 G PO PACK
17.0000 g | PACK | Freq: Every day | ORAL | Status: DC
  Administered 2024-03-22 – 2024-04-03 (×11): 17 g via ORAL
  Filled 2024-03-22 (×12): qty 1

## 2024-03-22 MED ORDER — APIXABAN 5 MG PO TABS
5.0000 mg | ORAL_TABLET | Freq: Two times a day (BID) | ORAL | Status: DC
  Administered 2024-03-22 – 2024-03-28 (×12): 5 mg via ORAL
  Filled 2024-03-22 (×12): qty 1

## 2024-03-22 MED ORDER — ORAL CARE MOUTH RINSE: 15.0000 mL | OROMUCOSAL | Status: DC | PRN

## 2024-03-22 MED ORDER — MIDAZOLAM HCL 2 MG/2ML IJ SOLN
INTRAMUSCULAR | Status: AC
  Filled 2024-03-22: qty 2

## 2024-03-22 MED ORDER — POTASSIUM CHLORIDE 20 MEQ PO PACK
40.0000 meq | PACK | Freq: Once | ORAL | Status: AC
  Administered 2024-03-22: 40 meq via ORAL
  Filled 2024-03-22: qty 2

## 2024-03-22 MED ORDER — NOREPINEPHRINE 4 MG/250ML-% IV SOLN
0.0000 ug/min | INTRAVENOUS | Status: DC
  Administered 2024-03-22: 3 ug/min via INTRAVENOUS
  Administered 2024-03-22: 2 ug/min via INTRAVENOUS
  Filled 2024-03-22 (×2): qty 250

## 2024-03-22 MED ORDER — BISACODYL 10 MG RE SUPP: 10.0000 mg | Freq: Every day | RECTAL | Status: DC | PRN

## 2024-03-22 MED ORDER — SODIUM CHLORIDE 0.9% FLUSH
3.0000 mL | Freq: Two times a day (BID) | INTRAVENOUS | Status: DC
Start: 2024-03-22 — End: 2024-03-22

## 2024-03-22 MED ORDER — SODIUM CHLORIDE 0.9% FLUSH: 10.0000 mL | INTRAVENOUS | Status: DC | PRN

## 2024-03-22 MED ORDER — SODIUM CHLORIDE 0.9 % IV SOLN: 250.0000 mL | INTRAVENOUS | Status: DC | PRN

## 2024-03-22 MED ORDER — DOCUSATE SODIUM 100 MG PO CAPS
100.0000 mg | ORAL_CAPSULE | Freq: Two times a day (BID) | ORAL | Status: DC
  Administered 2024-03-22 – 2024-04-03 (×22): 100 mg via ORAL
  Filled 2024-03-22 (×24): qty 1

## 2024-03-22 MED ORDER — LIDOCAINE HCL 1 % IJ SOLN
INTRAMUSCULAR | Status: AC
  Filled 2024-03-22: qty 20

## 2024-03-22 MED ORDER — LIDOCAINE HCL (PF) 1 % IJ SOLN
INTRAMUSCULAR | Status: DC | PRN
Start: 2024-03-22 — End: 2024-03-22
  Administered 2024-03-22 (×2): 2 mL via SUBCUTANEOUS

## 2024-03-22 MED ORDER — FUROSEMIDE 10 MG/ML IJ SOLN
80.0000 mg | Freq: Two times a day (BID) | INTRAMUSCULAR | Status: DC
  Administered 2024-03-22 (×2): 80 mg via INTRAVENOUS
  Filled 2024-03-22 (×2): qty 8

## 2024-03-22 SURGICAL SUPPLY — 6 items
CATH BALLN WEDGE 5F 110CM (CATHETERS) IMPLANT
DRAPE BRACHIAL (DRAPES) IMPLANT
GUIDEWIRE .025 260CM (WIRE) IMPLANT
PACK CARDIAC CATH (CUSTOM PROCEDURE TRAY) ×1 IMPLANT
SET ATX-X65L (MISCELLANEOUS) IMPLANT
SHEATH GLIDE SLENDER 4/5FR (SHEATH) IMPLANT

## 2024-03-22 NOTE — Progress Notes (Signed)
 Heart Failure Navigator Progress Note  Assessed for Heart & Vascular TOC clinic readiness.  Patient does not meet criteria due to consult for Advanced Heart Failure Team now.  Navigator will sign off at this time.  Celedonio Coil, RN, BSN [facility] Heart Failure Navigator Secure Chat Only

## 2024-03-22 NOTE — Progress Notes (Addendum)
 PROGRESS NOTE    Karen Farley  ZOX:[id number] DOB: 11-17-1945 DOA: 03/19/2024 PCP: Solomon Dupre, DO   Assessment & Plan:   Principal Problem:   Acute on chronic diastolic heart failure (HCC) Active Problems:   Myocardial injury   Hyponatremia   Acute renal failure superimposed on stage 3b chronic kidney disease (HCC)   HTN (hypertension)   Atrial fibrillation, chronic (HCC)   Hyperlipidemia   Type 2 diabetes mellitus with renal complication (HCC)   Overweight (BMI 25.0-29.9)   Acute on chronic diastolic CHF (congestive heart failure) (HCC)   Hypotension   Cirrhosis of liver with ascites (HCC)  Assessment and Plan: Acute on chronic diastolic CHF: echo 11/14/2021 showed EF 60 to 65%.  Has dyspnea, worsening leg edema, 15 pounds weight gain, clinically consistent with CHF exacerbation. Continue on IV lasix  & d/c aldactone . Monitor I/Os. S/p RHC which showed severely elevated right & left sided filling pressures w/ evidence of significant RV dysfunction & severely reduced cardiac index as per cardio. Started on milrinone drip & pressors as per cardio. Likely a component of cardiorenal syndrome. Cardio following and recs apprec. Advance heart failure consulted as per cardio    Hypotension: continue on pressors and wean as tolerated. Keep MAP >65  Persist pericardial effusion: small as seen on echo. Management as per cardio    Myocardial injury: very minimal elevation.  No chest pain, likely due to demand ischemia secondary to CHF exacerbation.   Hyponatremia: likely hypervolemic hyponatremia. Labile.    Possible cirrhosis: as per CT. No hx of alcohol abuse. Likely contributing to fluid overload. Hepatitis panel was neg.?NASH. Will need to f/u outpatient w/ GI   AKI on CKDIIIb: Baseline Cr 1.99. Cr is labile. Nephro following and recs apprec   Hypokalemia: potassium given.    HTN: d/c metoprolol , aldactone . Hypotensive currently    Chronic a. fib: continue on eliquis . D/c  metoprolol    GERD: continue on PPI.   HLD: continue on statin    DM2:  poorly controlled. Continue on glargine, SSI w/ accuchecks    Overweight: BMI 29.3. Would benefit from weight loss   Obesity: BMI 30.2. Would benefit from weight loss     DVT prophylaxis: eliquis  Code Status: full  Family Communication:  Disposition Plan: likely d/c back home  Status is: Inpatient Remains inpatient appropriate because: severity of illness  .  Level of care: ICU Consultants:  Nephro Cardio   Procedures:   Antimicrobials:   Subjective: Pt c/o b/l leg swelling   Objective: Vitals:   03/22/24 1145 03/22/24 1200 03/22/24 1243 03/22/24 1300  BP: (!) 88/70 103/68    Pulse: 66 73 71   Resp: (!) 21 15 13    Temp: (!) 97.5 F (36.4 C)  97.6 F (36.4 C)   TempSrc:   Oral   SpO2: 98% 99% 97%   Weight:    70.3 kg  Height:    5' (1.524 m)    Intake/Output Summary (Last 24 hours) at 03/22/2024 1455 Last data filed at 03/21/2024 2300 Gross per 24 hour  Intake 960 ml  Output --  Net 960 ml   Filed Weights   03/22/24 0505 03/22/24 1010 03/22/24 1300  Weight: 68.1 kg 68.1 kg 70.3 kg    Examination:  General exam: appears calm & comfortable  Respiratory system: decreased breath sounds b/l  Cardiovascular system: irregularly irregular. B/l LE edema  Gastrointestinal system: abd is soft, NT, obese & hypoactive bowel sounds  Central nervous system: alert &  oriented. Moves all extremities  Psychiatry: Judgement and insight appears at baseline. Appropriate mood and affect    Data Reviewed: I have personally reviewed following labs and imaging studies  CBC: Recent Labs  Lab 03/19/24 1038 03/20/24 0420 03/21/24 0954 03/22/24 0321 03/22/24 1118  WBC 6.1 5.5 5.3 5.7  --   HGB 12.9 12.0 12.6 12.3 14.6  HCT 42.0 38.3 39.8 39.6 43.0  MCV 70.5* 68.8* 69.0* 68.9*  --   PLT 313 281 298 269  --    Basic Metabolic Panel: Recent Labs  Lab 03/19/24 1038 03/19/24 2009  03/20/24 1336 03/20/24 2117 03/21/24 0954 03/22/24 0321 03/22/24 0910 03/22/24 1118  NA 127*   < > 130* 129* 128* 124* 126* 127*  K 4.4   < > 3.2* 3.4* 3.2* 2.7* 4.2 3.8  CL 90*   < > 91* 89* 88* 85* 87*  --   CO2 23   < > 24 24 23 24 26   --   GLUCOSE 159*   < > 182* 128* 124* 158* 140*  --   BUN 76*   < > 80* 83* 78* 80* 76*  --   CREATININE 2.81*   < > 2.65* 2.58* 2.50* 2.41* 2.43*  --   CALCIUM  9.5   < > 9.5 9.5 9.4 9.6 9.7  --   MG 2.3  --   --   --   --  2.8*  --   --   PHOS 4.7*  --   --   --   --   --   --   --    < > = values in this interval not displayed.   GFR: Estimated Creatinine Clearance: 17 mL/min (A) (by C-G formula based on SCr of 2.43 mg/dL (H)). Liver Function Tests: Recent Labs  Lab 03/19/24 1038  AST 19  ALT 16  ALKPHOS 88  BILITOT 2.1*  PROT 6.3*  ALBUMIN  3.8   No results for input(s): "LIPASE", "AMYLASE" in the last 168 hours. No results for input(s): "AMMONIA" in the last 168 hours. Coagulation Profile: No results for input(s): "INR", "PROTIME" in the last 168 hours. Cardiac Enzymes: No results for input(s): "CKTOTAL", "CKMB", "CKMBINDEX", "TROPONINI" in the last 168 hours. BNP (last 3 results) No results for input(s): "PROBNP" in the last 8760 hours. HbA1C: No results for input(s): "HGBA1C" in the last 72 hours. CBG: Recent Labs  Lab 03/21/24 2206 03/22/24 0758 03/22/24 1011 03/22/24 1152 03/22/24 1237  GLUCAP 193* 156* 145* 133* 130*   Lipid Profile: No results for input(s): "CHOL", "HDL", "LDLCALC", "TRIG", "CHOLHDL", "LDLDIRECT" in the last 72 hours. Thyroid  Function Tests: No results for input(s): "TSH", "T4TOTAL", "FREET4", "T3FREE", "THYROIDAB" in the last 72 hours. Anemia Panel: No results for input(s): "VITAMINB12", "FOLATE", "FERRITIN", "TIBC", "IRON ", "RETICCTPCT" in the last 72 hours. Sepsis Labs: No results for input(s): "PROCALCITON", "LATICACIDVEN" in the last 168 hours.  Recent Results (from the past 240 hours)   MRSA Next Gen by PCR, Nasal     Status: None   Collection Time: 03/22/24 12:53 PM   Specimen: Nasal Mucosa; Nasal Swab  Result Value Ref Range Status   MRSA by PCR Next Gen NOT DETECTED NOT DETECTED Final    Comment: (NOTE) The GeneXpert MRSA Assay (FDA approved for NASAL specimens only), is one component of a comprehensive MRSA colonization surveillance program. It is not intended to diagnose MRSA infection nor to guide or monitor treatment for MRSA infections. Test performance is not FDA approved in patients  less than 80 years old. Performed at [facility], [address], [place], [place] [address]          Radiology Studies: US  EKG SITE RITE Result Date: 03/22/2024 If [facility] image not attached, placement could not be confirmed due to current cardiac rhythm.  CARDIAC CATHETERIZATION Result Date: 03/22/2024 Successful right heart catheterization via the left basilic vein. Right heart catheterization showed severely elevated right atrial pressure, mild pulmonary hypertension, moderately elevated wedge pressure and severely reduced cardiac output. RA: 18 mmHg RV: 38/5 with an EDP of 22 mmHg PW: 26 mmHg PA: 38/22 with a mean of 30 mmHg PA sat was 55%. Cardiac output was 3.06 with an index of 1.85. Recommendations: The patient has evidence of biventricular failure with prominent RV dysfunction.  She likely has underlying cardiorenal syndrome and not able to diurese with some form of inotropic therapy.  Recommend starting small milrinone drip and IV diuresis.  I discontinued Toprol  and midodrine .  Will consult advanced heart failure to help with management.        Scheduled Meds:  allopurinol   100 mg Oral Daily   apixaban   5 mg Oral BID   Chlorhexidine  Gluconate Cloth  6 each Topical Daily   folic acid   1 mg Oral Daily   furosemide   80 mg Intravenous Q12H   insulin  aspart  0-5 Units Subcutaneous QHS   insulin  aspart  0-9 Units Subcutaneous TID WC   insulin   glargine-yfgn  10 Units Subcutaneous QHS   latanoprost   1 drop Both Eyes QHS   pantoprazole   40 mg Oral BID   potassium chloride   10 mEq Oral TID   [START ON 03/26/2024] rosuvastatin   5 mg Oral Weekly   Continuous Infusions:  milrinone 0.25 mcg/kg/min (03/22/24 1156)   norepinephrine (LEVOPHED) Adult infusion 2 mcg/min (03/22/24 1412)     LOS: 3 days      Alphonsus Jeans, MD [facility] Pager [phone]  If 7PM-7AM, please contact night-coverage [email or web address] 03/22/2024, 2:55 PM

## 2024-03-22 NOTE — Consult Note (Addendum)
 Advanced Heart Failure Team Consult Note   Primary Physician: Solomon Dupre, DO Cardiologist:  Timothy Gollan, MD  Reason for Consultation: Acute on Chronic Biventricular Heart Failure HPI:    Karen Farley is seen today for evaluation of acute on chronic biventricular heart failure at the request of Dr. Alvenia Aus.   Karen Farley is a 78 y.o. female with history of HFpEF, HTN, CKD 3b, permanent AF on Eliquis , hypotension on midodrine , HLD, T2DM, and GIB. Has followed with [facility] Dr. Gollan.   Admitted to [facility] 07/2021 with a/c HFpEF and anasarca. She underwent paracentesis, thoracentesis, and pericardiocentesis. Diuresed with IV Lasix  gtt. Course c/b GIB.  Last echo 10/2021: EF 60-65%, normal RV, small to mod pericardial effusion, mod MR  Seen in clinic 02/24/24 with significant weight gain of 15 lbs after metolazone  and midodrine  discontinued. Her Torsemide  was increased to 20 mg bid and metolazone  2.5 mg to three times weekly. Midodrine  was also resume.   Presented to [facility] ED 03/19/24 with progressive shortness of breath and lower extremity edema for 2 weeks. In ED BP 108/86, HR 70s, normothermic. Labs notable for Na 127, K 4.4, BUN/Cr 76/2.81, BNP 265, hs-trop 26, hgb 12.9. EKG with AF 80 bpm. CXR with hyperinflation, no pulm edema. CT chest with suspected liver cirrhosis and pericardial thickening vs small volume fluid. Echo ordered (see below) and cardiology consulted. Started on IV diuresis with Lasix . Patient has reported poor response to Lasix , I/Os not complete, weight down 4 lbs since admission. AKI slowly improving. RHC performed to further evaluate filling pressures (below).   Echo 03/20/24: EF 60-65%, mod LVH, normal RV function, echogenic material in pericarium lateral to RV apex (?fat pad vs. hemopericardium), small pericardial effusion w/o tamponade, mobile MAC no mitral annulus w mild MR   RHC 03/22/24: RA 18, PA 38/22 (30), PCW 26, CO/CI 3.06/1.85, PAPi 0.89.  Started on Milrinone 0.25 mcg/kg/min.  On exam in the room, no SOB, CP, or dizziness. Husband at the bedside. Reports that her swelling in her legs has been going on for 3-4 weeks and now has abdominal bloating, has not had an appetite recently.   Home Medications Prior to Admission medications   Medication Sig Start Date End Date Taking? Authorizing Provider  allopurinol  (ZYLOPRIM ) 100 MG tablet Take 1 tablet (100 mg total) by mouth daily. 12/19/23  Yes Johnson, [name], DO  apixaban  (ELIQUIS ) 5 MG TABS tablet Take 1 tablet (5 mg total) by mouth 2 (two) times daily. 12/19/23  Yes Johnson, [name], DO  empagliflozin  (JARDIANCE ) 25 MG TABS tablet TAKE 1 TABLET BY MOUTH ONCE DAILY BEFOREBREAKFAST 12/19/23  Yes Johnson, [name], DO  folic acid  (FOLVITE ) 1 MG tablet Take 1 tablet (1 mg total) by mouth daily. 09/30/23  Yes Johnson, [name], DO  insulin  glargine, 2 Unit Dial, (TOUJEO  MAX SOLOSTAR) 300 UNIT/ML Solostar Pen Inject 16-20 Units into the skin daily. 12/19/23 09/14/24 Yes Johnson, [name], DO  latanoprost  (XALATAN ) 0.005 % ophthalmic solution Place 1 drop into both eyes at bedtime.   Yes [provider]  metolazone  (ZAROXOLYN ) 2.5 MG tablet Take 2.5 mg by mouth once a week. Wednesday   Yes [provider]  metoprolol  succinate (TOPROL -XL) 25 MG 24 hr tablet Take 0.5 tablets (12.5 mg total) by mouth daily. 12/19/23  Yes Johnson, [name], DO  midodrine  (PROAMATINE ) 10 MG tablet TAKE 1 TABLET BY MOUTH 3 TIMES DAILY WITH MEALS 02/24/23  Yes Gollan, Timothy J, MD  pantoprazole  (  PROTONIX ) 40 MG tablet Take 1 tablet (40 mg total) by mouth 2 (two) times daily. 12/19/23  Yes Johnson, [name], DO  potassium chloride  SA (KLOR-CON  M) 20 MEQ tablet Take 2 tablets (40 mEq total) by mouth 2 (two) times daily. 12/19/23  Yes Johnson, [name], DO  rosuvastatin  (CRESTOR ) 5 MG tablet Take 1 tablet (5 mg total) by mouth once a week. 05/30/23  Yes Johnson, [name], DO  Semaglutide  (RYBELSUS ) 7 MG TABS Take  1 tablet (7 mg total) by mouth daily. 12/19/23  Yes Johnson, [name], DO  spironolactone  (ALDACTONE ) 50 MG tablet Take 1 tablet (50 mg total) by mouth daily. 12/19/23  Yes Johnson, [name], DO  torsemide  (DEMADEX ) 20 MG tablet TAKE 1 TO 2 TABLETS BY MOUTH DAILY. ONLY TAKE 2 TABLETS FOR 1 TO 3 DAYS AND IF PERSISTS, CALL WITH BAD SWELLING 12/19/23  Yes Johnson, [name], DO  Continuous Glucose Sensor (DEXCOM G7 SENSOR) MISC by Does not apply route.    [provider]  Glucagon  (GVOKE HYPOPEN  2-PACK) 0.5 MG/0.1ML SOAJ Inject 1 each into the skin daily as needed. Patient not taking: Reported on 03/19/2024 03/14/23   Terre Ferri P, DO  Insulin  Pen Needle (PEN NEEDLES) 32G X 4 MM MISC 1 each by Does not apply route 3 (three) times daily. Patient not taking: Reported on 02/24/2024 01/30/23   Terre Ferri P, DO  Insulin  Pen Needle (ULTRACARE PEN NEEDLES) 32G X 4 MM MISC USE 1 PEN ONCE DAILY Patient not taking: Reported on 02/24/2024 10/11/22   Terre Ferri P, DO  Insulin  Syringe-Needle U-100 (INSULIN  SYRINGE 1CC/31GX5/16") 31G X 5/16" 1 ML MISC 1 each by Does not apply route 3 (three) times daily. Patient not taking: Reported on 02/24/2024 12/13/22   Terre Ferri P, DO  Insulin  Syringe/Needle U-500 (BD INSULIN  SYRINGE U-500) 31G X 0.5 ML MISC 1 each by Does not apply route 3 (three) times daily as needed. Patient not taking: Reported on 02/24/2024 03/14/23   Terre Ferri P, DO  metroNIDAZOLE (METROGEL) 1 % gel Apply to face every morning. Patient not taking: Reported on 03/19/2024 09/19/22   [provider]  NEEDLE, DISP, 30 G (BD DISP NEEDLES) 30G X 1/2" MISC by Does not apply route. Patient not taking: Reported on 02/24/2024    [provider]    Past Medical History: Past Medical History:  Diagnosis Date   A-fib [facility])    CHF (congestive heart failure) (HCC)    Chronic heart failure with preserved ejection fraction (HFpEF) (HCC)    a. 11/2019 Echo: EF 60-65%; b. 07/2021 Echo:  EF 60-65%.   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 07/2021 req 2u prbcs. Eval deferred given resolution of bleeding and cardiac issues.   Hyperlipidemia    Hypertension    Lymphedema    Morbid obesity (HCC)    Osteoarthritis of both knees    Osteopenia    Pericardial effusion    a. 07/2021 Echo: Nl EF. Large pericardial effusion-->s/p pericardiocentesis of ; b. 08/23/2021 Echo: EF 60-65%, mild LVH, Nl RV fxn, sev dil LA, moderate pericardial effusion. L pleural effusion.   Permanent atrial fibrillation (HCC)    Pleural effusion, left    a. 07/2021 s/p thoracentesis - .  Past Surgical History: Past Surgical History:  Procedure Laterality Date   BASAL CELL CARCINOMA EXCISION  11/01/2022   CATARACT EXTRACTION W/PHACO Left 10/09/2021   Procedure: CATARACT EXTRACTION PHACO AND INTRAOCULAR LENS PLACEMENT (IOC) LEFT DIABETIC 4.84 00:37.8;  Surgeon: Clair Crews, MD;  Location: [facility];  Service: Ophthalmology;  Laterality: Left;  Diabetic   CATARACT EXTRACTION W/PHACO Right 10/30/2021   Procedure: CATARACT EXTRACTION PHACO AND INTRAOCULAR LENS PLACEMENT (IOC) RIGHT DIABETIC 5.65 00:38.5;  Surgeon: Clair Crews, MD;  Location: [facility];  Service: Ophthalmology;  Laterality: Right;  Diabetic   CHOLECYSTECTOMY     DILATION AND CURETTAGE OF UTERUS     IR FLUORO GUIDE CV LINE RIGHT  08/16/2021   IR THORACENTESIS ASP PLEURAL SPACE W/IMG GUIDE  08/16/2021   PERICARDIOCENTESIS N/A 08/05/2021   Procedure: PERICARDIOCENTESIS;  Surgeon: Percival Brace, MD;  Location: [facility] INVASIVE CV LAB;  Service: Cardiovascular;  Laterality: N/A;   RIGHT HEART CATH N/A 03/22/2024   Procedure: RIGHT HEART CATH;  Surgeon: Wenona Hamilton, MD;  Location: [facility]  INVASIVE CV LAB;  Service: Cardiovascular;  Laterality: N/A;   TEAR DUCT PROBING WITH STRABISMUS REPAIR Right    TONSILLECTOMY      Family History: Family History  Problem Relation Age of Onset   Heart disease Mother        CHF   Osteoporosis Mother    Breast cancer Mother    Heart disease Father 66   Cancer Brother        Colon CA- 2002- Youngest brother   Parkinson's disease Brother        Younger Brother   Heart disease Brother        2 brothers   Diabetes Brother    Cancer Brother    Breast cancer Maternal Grandmother     Social History: Social History   Socioeconomic History   Marital status: Married    Spouse name: Not on file   Number of children: Not on file   Years of education: Not on file   Highest education level: Not on file  Occupational History   Not on file  Tobacco Use   Smoking status: Never   Smokeless tobacco: Never  Vaping Use   Vaping status: Never Used  Substance and Sexual Activity   Alcohol use: Not Currently    Alcohol/week: 0.0 standard drinks of alcohol    Comment: on rare occasion   Drug use: No   Sexual activity: Not Currently  Other Topics Concern   Not on file  Social History Narrative   Not on file   Social Drivers of Health   Financial Resource Strain: Low Risk  (07/23/2022)   Overall Financial Resource Strain (CARDIA)    Difficulty of Paying Living Expenses: Not hard at all  Food Insecurity: No Food Insecurity (03/19/2024)   Hunger Vital Sign    Worried About Running Out of Food in the Last Year: Never true    Ran Out of Food in the Last Year: Never true  Transportation Needs: No Transportation Needs (03/19/2024)   PRAPARE - Administrator, Civil Service (Medical): No    Lack of Transportation (Non-Medical): No  Physical Activity: Sufficiently Active (07/23/2022)   Exercise Vital Sign    Days of Exercise per Week: 3 days    Minutes of Exercise per Session: 60 min  Stress: No Stress Concern Present  (07/23/2022)   [employer] of Occupational Health - Occupational Stress Questionnaire    Feeling  of Stress : Not at all  Social Connections: Unknown (03/19/2024)   Social Connection and Isolation Panel [NHANES]    Frequency of Communication with Friends and Family: Twice a week    Frequency of Social Gatherings with Friends and Family: Once a week    Attends Religious Services: Not on Marketing executive or Organizations: Not on file    Attends Banker Meetings: Not on file    Marital Status: Married   Allergies:  No Known Allergies  Objective:    Vital Signs:   Temp:  [97.1 F (36.2 C)-98.1 F (36.7 C)] 97.5 F (36.4 C) (06/02 1145) Pulse Rate:  [66-80] 73 (06/02 1200) Resp:  [13-21] 15 (06/02 1200) BP: (88-108)/(61-71) 103/68 (06/02 1200) SpO2:  [98 %-100 %] 99 % (06/02 1200) Weight:  [68.1 kg] 68.1 kg (06/02 1010)   Weight change: Filed Weights   03/21/24 0500 03/22/24 0505 03/22/24 1010  Weight: 68.7 kg 68.1 kg 68.1 kg   Intake/Output:  Intake/Output Summary (Last 24 hours) at 03/22/2024 1229 Last data filed at 03/21/2024 2300 Gross per 24 hour  Intake 960 ml  Output --  Net 960 ml    Physical Exam    General: Frail appearing. No distress on RA Cardiac: ~9cm. S1 and S2 present. No murmurs or rub. Resp: Lung sounds coarse Abdomen: Soft, non-tender, + distended Extremities: Cool and dry. 3+ BLE edema.  Neuro: Alert and oriented x3. Affect pleasant. Generalized weakness  Telemetry   AF in 70s (personally reviewed)  Labs   Basic Metabolic Panel: Recent Labs  Lab 03/19/24 1038 03/19/24 2009 03/20/24 1336 03/20/24 2117 03/21/24 0954 03/22/24 0321 03/22/24 0910 03/22/24 1118  NA 127*   < > 130* 129* 128* 124* 126* 127*  K 4.4   < > 3.2* 3.4* 3.2* 2.7* 4.2 3.8  CL 90*   < > 91* 89* 88* 85* 87*  --   CO2 23   < > 24 24 23 24 26   --   GLUCOSE 159*   < > 182* 128* 124* 158* 140*  --   BUN 76*   < > 80* 83* 78* 80* 76*  --    CREATININE 2.81*   < > 2.65* 2.58* 2.50* 2.41* 2.43*  --   CALCIUM  9.5   < > 9.5 9.5 9.4 9.6 9.7  --   MG 2.3  --   --   --   --  2.8*  --   --   PHOS 4.7*  --   --   --   --   --   --   --    < > = values in this interval not displayed.   Liver Function Tests: Recent Labs  Lab 03/19/24 1038  AST 19  ALT 16  ALKPHOS 88  BILITOT 2.1*  PROT 6.3*  ALBUMIN  3.8   CBC: Recent Labs  Lab 03/19/24 1038 03/20/24 0420 03/21/24 0954 03/22/24 0321 03/22/24 1118  WBC 6.1 5.5 5.3 5.7  --   HGB 12.9 12.0 12.6 12.3 14.6  HCT 42.0 38.3 39.8 39.6 43.0  MCV 70.5* 68.8* 69.0* 68.9*  --   PLT 313 281 298 269  --    BNP (last 3 results) Recent Labs    12/19/23 1624 03/19/24 1038  BNP 132.4* 265.4*   CBG: Recent Labs  Lab 03/21/24 1610 03/21/24 2206 03/22/24 0758 03/22/24 1011 03/22/24 1152  GLUCAP 146* 193* 156* 145* 133*   Imaging  CARDIAC CATHETERIZATION Result Date: 03/22/2024 Successful right heart catheterization via the left basilic vein. Right heart catheterization showed severely elevated right atrial pressure, mild pulmonary hypertension, moderately elevated wedge pressure and severely reduced cardiac output. RA: 18 mmHg RV: 38/5 with an EDP of 22 mmHg PW: 26 mmHg PA: 38/22 with a mean of 30 mmHg PA sat was 55%. Cardiac output was 3.06 with an index of 1.85. Recommendations: The patient has evidence of biventricular failure with prominent RV dysfunction.  She likely has underlying cardiorenal syndrome and not able to diurese with some form of inotropic therapy.  Recommend starting small milrinone drip and IV diuresis.  I discontinued Toprol  and midodrine .  Will consult advanced heart failure to help with management.   Medications:    Current Medications:  allopurinol   100 mg Oral Daily   apixaban   5 mg Oral BID   folic acid   1 mg Oral Daily   furosemide   60 mg Intravenous Q12H   insulin  aspart  0-5 Units Subcutaneous QHS   insulin  aspart  0-9 Units Subcutaneous TID WC    insulin  glargine-yfgn  10 Units Subcutaneous QHS   latanoprost   1 drop Both Eyes QHS   pantoprazole   40 mg Oral BID   potassium chloride   10 mEq Oral TID   [START ON 03/26/2024] rosuvastatin   5 mg Oral Weekly   sodium chloride  flush  3 mL Intravenous Q12H   spironolactone   50 mg Oral Daily   Infusions:  sodium chloride      milrinone 0.25 mcg/kg/min (03/22/24 1156)   Patient Profile   Karen Farley is a 78 y.o. female with history of HFpEF, HTN, CKD 3b, permanent AF on Eliquis , hypotension on midodrine , HLD, T2DM, and GIB. Has followed with [facility] Dr. Gollan.   Assessment/Plan   1. Acute on Chronic Biventricular Heart Failure; HFpEF; Predominantly RV Failure - EF 60-65% with normal RV on echo read, however RHC with low output biventricular heart failure - RHC 03/22/24: RA 18, PA 38/22 (30), PCW 26, CO/CI 3.06/1.85, PAPi 0.89 - PICC placement for CVP/Coox monitoring - continue midodrine  0.25 mcg/kg/ming - continue Lasix  80 mg IV bid, check urine Na after first administration - hold ? blocker with milrinone and low output - start NE as needed for BP - stop spiro for Cr >2 - GDMT limited by renal function and BP - Place UNNA boots  2. Small pericardial Effusion - h/o pericardial effusion s/p pericardiocentesis 700 ml in 07/2021. Bloody, neg for malignancy. - small w/o tamponade on echo - continue to monitor for signs of tamponade  3. Mitral Regurgitation - mod with moderate calicidication on echo 1/23 - now mild with severe calcification and mobile MAC on annulus  4. AKI on CKD 3b:  - baseline Cr ~2.0. Up to 2.8 on admission - improving with diuresis. Continue diuretics as above. - stop spiro - avoid hypotension - nephrology following, appreciate recs  5. Hypotension - suspect with starting milrinone and stopping midodrine  that she will have hypotension.  - SBP goal >90 and MAP goal >65 - start NE as needed for BP support   6. Atrial Fibrillation: Permanent -  rate controlled on tele - hold ? blocker with milrinone and low output - continue Eliquis  5 mg bid  7. Hyponatremia - diuresis as above  8.Hypokalemia - improving - continue replacement for goal K>4  9. H/o cirrhosis - s/p paracentesis in 2022 - CT chest with suspected liver cirrhosis - suspect this was/is in the setting of cardiohepatic  syndrome - AST/ALT nl on admit  Length of Stay: 3  CRITICAL CARE Performed by: [place] Lee  Total critical care time: 35 minutes  Critical care time was exclusive of separately billable procedures and treating other patients.  Critical care was necessary to treat or prevent imminent or life-threatening deterioration.  Critical care was time spent personally by me on the following activities: development of treatment plan with patient and/or surrogate as well as nursing, discussions with consultants, evaluation of patient's response to treatment, examination of patient, obtaining history from patient or surrogate, ordering and performing treatments and interventions, ordering and review of laboratory studies, ordering and review of radiographic studies, pulse oximetry and re-evaluation of patient's condition.  [place] Lee, NP  03/22/2024, 12:29 PM  Advanced Heart Failure Team Pager [phone] (M-F; 7a - 5p)  Please contact [facility] for night-coverage after hours (4p -7a ) and weekends on [email or web address]  Agree with above   89 y/o woman with h/o diastolic HF, permanent AF, CKD IIIb--IV, DM2, obesity and cardiac cirrhosis  Admitted with progressive R> L HF symptoms. Over last few months has become quite limited   ECHO with EF 60-65% normal RV function. Severe biatrial enlargement and moderate AS.   Unable to diurese effectively due to low BP.   RHC today with severe biventricular failure  Now on milrinone and NE with mild diuresis  General:  Elderly. weak appearing. No resp difficulty HEENT: normal Neck: supple. JV to ear  Carotids 2+  bilat; + bruits.  Cor: irreg 3/6 AS S2 moderately depressed Lungs: clear Abdomen: soft, nontender, + distended. No hepatosplenomegaly. No bruits or masses. Good bowel sounds. Extremities: no cyanosis, clubbing, rash, 2+ edema Neuro: alert & orientedx3, cranial nerves grossly intact. moves all 4 extremities w/o difficulty. Affect pleasant  She has end-stage diastolic HF likely in the setting of longstanding restrictive CM compounded by CKD IV and progressive AS (at least moderate).   Agree with short-course of inotropic/pressor support to see how well we can optimize her but suspect long-term options are quite limited.   Will follow co-ox and CVP. Wrap legs. Ideally would get cMRI to exclude infiltrative process but echo not overly suspicious for this,   Suspect it may be reasonable to get Palliative Care team involved to help with Goals of Care decisions if not improving.   CRITICAL CARE Performed by: Jules Oar  Total critical care time: 50 minutes  Critical care time was exclusive of separately billable procedures and treating other patients.  Critical care was necessary to treat or prevent imminent or life-threatening deterioration.  Critical care was time spent personally by me (independent of midlevel providers or residents) on the following activities: development of treatment plan with patient and/or surrogate as well as nursing, discussions with consultants, evaluation of patient's response to treatment, examination of patient, obtaining history from patient or surrogate, ordering and performing treatments and interventions, ordering and review of laboratory studies, ordering and review of radiographic studies, pulse oximetry and re-evaluation of patient's condition.  Jules Oar, MD  8:33 PM

## 2024-03-22 NOTE — Care Management Important Message (Signed)
 Important Message  Patient Details  Name: Karen Farley MRN: [id number] Date of Birth: 06-14-1946   Important Message Given:  Yes - Medicare IM     Anise Kerns 03/22/2024, 12:54 PM

## 2024-03-22 NOTE — Progress Notes (Signed)
 Progress Note  Patient Name: Karen Farley Date of Encounter: 03/22/2024 [facility] Cardiologist: Belva Boyden, MD   Interval Summary    She continues to complain of shortness of breath, abdominal swelling and significant lower extremity edema.  Diuresis has been complicated by acute on chronic kidney disease.  Right heart catheterization was performed today via the left upper extremity and confirmed significant biventricular failure with volume overload, RV dysfunction and severely reduced cardiac index.  Vital Signs Vitals:   03/22/24 1010 03/22/24 1026 03/22/24 1145 03/22/24 1200  BP: 101/69  (!) 88/70 103/68  Pulse: 68  66 73  Resp: 13  (!) 21 15  Temp: (!) 97.5 F (36.4 C)  (!) 97.5 F (36.4 C)   TempSrc: Oral     SpO2: 98% 98% 98% 99%  Weight: 68.1 kg     Height: 5' (1.524 m)       Intake/Output Summary (Last 24 hours) at 03/22/2024 1220 Last data filed at 03/21/2024 2300 Gross per 24 hour  Intake 960 ml  Output --  Net 960 ml      03/22/2024   10:10 AM 03/22/2024    5:05 AM 03/21/2024    5:00 AM  Last 3 Weights  Weight (lbs) 150 lb 2.1 oz 150 lb 2.1 oz 151 lb 7.3 oz  Weight (kg) 68.1 kg 68.1 kg 68.7 kg      Telemetry/ECG  Afib HR 60s - Personally Reviewed  Physical Exam  GEN: No acute distress.   Neck: No JVD Cardiac: RRR, + murmur, no rubs, or gallops.  Respiratory: Clear to auscultation bilaterally. GI: Soft, nontender, non-distended  MS: No edema  Assessment & Plan   Acute on chronic diastolic heart failure with cardiorenal syndrome.: Right heart catheterization confirmed moderate to severely elevated right and left-sided filling pressures with evidence of significant RV dysfunction and severely reduced cardiac index.  I recommend IV diuresis with milrinone infusion to support right ventricle given strong evidence for cardiorenal syndrome. Consider PICC line placement with CVP monitoring. Will consult advanced heart failure. Consider  outpatient PYP scan to evaluate for cardiac amyloidosis. I discontinued small dose metoprolol  and midodrine .  If hypotension worsens with milrinone, recommend starting small dose norepinephrine drip.- continue spironolactone  50mg  daily    Elevated troponin - HS trop 26>25 - suspect supply demand mismatch  Small pericardial effusion: This seems to be chronic.  Pericardial thickening was described on CT scan but no evidence of constriction by right heart catheterization.   Hyponatremia - NA 127>130>29 - in the setting of volume overload - This is due to heart failure.  Recommend against sodium tablets.  Recommend free water restriction instead.   AKI on CKD stage 3 - Scr 1.99 baseline - Scr 2.81 on admission, today's creatinine is 2.  4 3.  CRITICAL CARE Performed by: Antionette Kirks   Total critical care time: 40 minutes  Critical care time was exclusive of separately billable procedures and treating other patients.  Critical care was necessary to treat or prevent imminent or life-threatening deterioration.  Critical care was time spent personally by me on the following activities: development of treatment plan with patient and/or surrogate as well as nursing, discussions with consultants, evaluation of patient's response to treatment, examination of patient, obtaining history from patient or surrogate, ordering and performing treatments and interventions, ordering and review of laboratory studies, ordering and review of radiographic studies, pulse oximetry and re-evaluation of patient's condition.    For questions or updates, please contact [facility] Please consult [email or web address] for contact info under       Signed, Antionette Kirks, MD

## 2024-03-22 NOTE — Progress Notes (Signed)
 Dr. Alvenia Aus in at bedside to speak with pt. Re: RHC results. Pt. Verbalized understanding of conversation with MD.

## 2024-03-22 NOTE — Progress Notes (Signed)
 Peripherally Inserted Central Catheter Placement  The IV Nurse has discussed with the patient and/or persons authorized to consent for the patient, the purpose of this procedure and the potential benefits and risks involved with this procedure.  The benefits include less needle sticks, lab draws from the catheter, and the patient may be discharged home with the catheter. Risks include, but not limited to, infection, bleeding, blood clot (thrombus formation), and puncture of an artery; nerve damage and irregular heartbeat and possibility to perform a PICC exchange if needed/ordered by physician.  Alternatives to this procedure were also discussed.  Bard Power PICC patient education guide, fact sheet on infection prevention and patient information card has been provided to patient /or left at bedside.    PICC Placement Documentation  PICC Triple Lumen 03/22/24 Left Basilic 40 cm 0 cm (Active)  Indication for Insertion or Continuance of Line Vasoactive infusions 03/22/24 1600  Exposed Catheter (cm) 0 cm 03/22/24 1600  Site Assessment Clean, Dry, Intact 03/22/24 1600  Lumen #1 Status Flushed;Saline locked;Blood return noted 03/22/24 1600  Lumen #2 Status Flushed;Saline locked;Blood return noted 03/22/24 1600  Lumen #3 Status Flushed;Saline locked;Blood return noted 03/22/24 1600  Dressing Type Transparent;Securing device 03/22/24 1600  Dressing Status Antimicrobial disc/dressing in place;Clean, Dry, Intact 03/22/24 1600  Line Care Connections checked and tightened 03/22/24 1600  Line Adjustment (NICU/IV Team Only) No 03/22/24 1600  Dressing Intervention New dressing;Adhesive placed at insertion site (IV team only) 03/22/24 1600  Dressing Change Due 03/29/24 03/22/24 1600       Roseanne Cones Renee 03/22/2024, 4:52 PM

## 2024-03-22 NOTE — Plan of Care (Signed)

## 2024-03-22 NOTE — Consult Note (Signed)
 NAME:  LENNYN GANGE, MRN:  [id number], DOB:  May 08, 1946, LOS: 3 ADMISSION DATE:  03/19/2024, CONSULTATION DATE: 03/22/2024 REFERRING MD: Dr. Alvenia Aus, CHIEF COMPLAINT: Shortness of Breath   History of Present Illness:  This is a 78 yo female who presented to [facility] ER on 05/30 from home with abdominal distention, leg swelling, and shortness of breath with exertion onset of symptoms several weeks ago.  Pt reported she had been unable to tolerate laying flat at night due to shortness of breath.  At baseline she uses a walker to ambulate, but has only been able to take a few steps due to symptoms outlined above.  She also endorsed a 15 lb weight gain from her baseline over a 3 week period.  She has been compliant with her outpatient torsemide  and metolazone , and she recently has taken a double dose of her torsemide  recently.  Due to worsening symptoms pt proceeded to the ER for evaluation.  All hx obtained from EDP note.  ED Course Upon arrival to the ER significant lab results were: Na+ 127/glucose 159/BUN 76/creatinine 2.81/phosphorous 4.7/total bilirubin 2.1/BNP 265.4/troponin 26.  CT Chest revealed borderline cardiomegaly with mild diffuse pericardial thickening vs. small volume fluid; esophagitis vs. reflux; and suspected liver cirrhosis.  CXR negative for acute cardiopulmonary disease.  She received 40 mg iv lasix .  She was subsequently admitted to the telemetry unit for additional workup and treatment by hospitalist team.  See detailed hospital course below under significant events.    Pertinent  Medical History  Permanent Atrial Fibrillation  HFpEF CKD stage III Type I Diabetes Mellitus  HLD HTN  Lymphedema  Morbid Obesity  Osteopenia Pericardial Effusion   Significant Hospital Events: Including procedures, antibiotic start and stop dates in addition to other pertinent events   05/30: Pt admitted to the telemetry unit with acute on chronic respiratory failure secondary to acute on  chronic diastolic CHF  05/31: No significant ascites.  Lobular and heterogeneous appearance of the liver consistent        with history of cirrhosis.  Due to worsening renal failure lasix  held  05/31: Echo revealed 60 to 65%; echogenic material in the pericardium lateral to the RV apex that has been visualized on prior echo may represent fat pad vs. prior hemopericardium; small pericardial effusion without tamponade; mild mitral valve regurgitation; mild mitral stenosis; severe thickening of aortic valve  06/02: Right heart catheterization revealed severely elevated right atrial pressure, mild pulmonary hypertension, moderately elevated wedge pressure, and severely reduced cardiac output.  Pt transferred to ICU to start milrinone gtt, iv diuresis, and levophed gtt.  PCCM team consulted to assist with management.   Interim History / Subjective:  Pt currently on RA on levophed gtt @2mcg /min to maintain map 65 or higher and milrinone 0.25 mcg/kg/min.  Pt denies shortness of breath   Objective    Blood pressure 103/68, pulse 71, temperature 97.6 F (36.4 C), temperature source Oral, resp. rate 13, height 5' (1.524 m), weight 68.1 kg, SpO2 97%.        Intake/Output Summary (Last 24 hours) at 03/22/2024 1405 Last data filed at 03/21/2024 2300 Gross per 24 hour  Intake 960 ml  Output --  Net 960 ml   Filed Weights   03/21/24 0500 03/22/24 0505 03/22/24 1010  Weight: 68.7 kg 68.1 kg 68.1 kg    Examination: General: Chronically-ill appearing female, NAD on RA  HENT: Supple, no JVD  Lungs: Faint crackles throughout, even, non labored  Cardiovascular: Irregular irregular, no  r/g, 2+ radial/1+ distal pulses, 3+ bilateral lower extremity pitting edema  Abdomen: +BS x4, obese, soft, non tender, non distended  Extremities: Moves all extremities  Skin: No rashes or lesions present  Neuro: Alert and oriented, following commands, PERRLA  GU: Voiding   Resolved problem list   Assessment and Plan    #Acute chronic diastolic CHF  #Mild pulmonary hypertension  #Permanent atrial fibrillation  #Small pericardial effusion  #Mitral regurgitation  #Cardiorenal syndrome  Hx: Pericardial effusion, HLD, and HTN  RHC 03/22/2024: showed severely decreased cardiac output, biventricular failure with RV dysfunction.  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  #Hypokalemia  - Trend BMP  - Replete electrolytes as indicated  - Strict I&O's - Avoid nephrotoxic agents as able  - Nephrology consulted appreciate input  - Urine sodium and urine osmolality pending   #Acute respiratory failure secondary to CHF exacerbation  - Supplemental O2 for dyspnea and/or hypoxia  - Maintain O2 sats 92% or higher  - Prn bronchodilator therapy   #H/o Cirrhosis  - Trend hepatic function panel  - Avoid hepatotoxic agents as able   #Type II diabetes mellitus  - CBG's ac/hs  - SSI and scheduled semglee   - Follow hypo/hyperglycemic protocol  - Target range 140 to 180   Best Practice (right click and "Reselect all SmartList Selections" daily)   Diet/type: Regular consistency (see orders) DVT prophylaxis Apixaban   Pressure ulcer(s): N/A GI prophylaxis: PPI Lines: Pending PICC line placement  Foley:  N/A Code Status:  full code Last date of multidisciplinary goals of care discussion [N/A]  Labs   CBC: Recent Labs  Lab 03/19/24 1038 03/20/24 0420 03/21/24 0954 03/22/24 0321 03/22/24 1118  WBC 6.1 5.5 5.3 5.7  --   HGB 12.9 12.0 12.6 12.3 14.6  HCT 42.0 38.3 39.8 39.6 43.0  MCV 70.5* 68.8* 69.0* 68.9*  --   PLT 313 281 298 269  --     Basic Metabolic Panel: Recent Labs  Lab 03/19/24 1038  03/19/24 2009 03/20/24 1336 03/20/24 2117 03/21/24 0954 03/22/24 0321 03/22/24 0910 03/22/24 1118  NA 127*   < > 130* 129* 128* 124* 126* 127*  K 4.4   < > 3.2* 3.4* 3.2* 2.7* 4.2 3.8  CL 90*   < > 91* 89* 88* 85* 87*  --   CO2 23   < > 24 24 23 24 26   --   GLUCOSE 159*   < > 182* 128* 124* 158* 140*  --   BUN 76*   < > 80* 83* 78* 80* 76*  --   CREATININE 2.81*   < > 2.65* 2.58* 2.50* 2.41* 2.43*  --   CALCIUM  9.5   < > 9.5 9.5 9.4 9.6 9.7  --   MG 2.3  --   --   --   --  2.8*  --   --   PHOS 4.7*  --   --   --   --   --   --   --    < > = values in this interval not displayed.   GFR: Estimated Creatinine Clearance: 16.7 mL/min (A) (by C-G formula based on SCr of 2.43  mg/dL (H)). Recent Labs  Lab 03/19/24 1038 03/20/24 0420 03/21/24 0954 03/22/24 0321  WBC 6.1 5.5 5.3 5.7    Liver Function Tests: Recent Labs  Lab 03/19/24 1038  AST 19  ALT 16  ALKPHOS 88  BILITOT 2.1*  PROT 6.3*  ALBUMIN  3.8   No results for input(s): "LIPASE", "AMYLASE" in the last 168 hours. No results for input(s): "AMMONIA" in the last 168 hours.  ABG    Component Value Date/Time   HCO3 26.9 03/22/2024 1118   TCO2 28 03/22/2024 1118   ACIDBASEDEF 8.7 (H) 04/11/2021 0210   O2SAT 54 03/22/2024 1118     Coagulation Profile: No results for input(s): "INR", "PROTIME" in the last 168 hours.  Cardiac Enzymes: No results for input(s): "CKTOTAL", "CKMB", "CKMBINDEX", "TROPONINI" in the last 168 hours.  HbA1C: Hemoglobin A1C  Date/Time Value Ref Range Status  06/28/2016 12:00 AM 6.6  Final   HB A1C (BAYER DCA - WAIVED)  Date/Time Value Ref Range Status  12/19/2023 04:24 PM 7.4 (H) 4.8 - 5.6 % Final    Comment:             Prediabetes: 5.7 - 6.4          Diabetes: >6.4          Glycemic control for adults with diabetes: <7.0   09/02/2023 01:35 PM 6.3 (H) 4.8 - 5.6 % Final    Comment:             Prediabetes: 5.7 - 6.4          Diabetes: >6.4          Glycemic control for adults  with diabetes: <7.0     CBG: Recent Labs  Lab 03/21/24 2206 03/22/24 0758 03/22/24 1011 03/22/24 1152 03/22/24 1237  GLUCAP 193* 156* 145* 133* 130*    Review of Systems: Positives in BOLD   Gen: Denies fever, chills, weight change, fatigue, night sweats HEENT: Denies blurred vision, double vision, hearing loss, tinnitus, sinus congestion, rhinorrhea, sore throat, neck stiffness, dysphagia PULM: shortness of breath, cough, sputum production, hemoptysis, wheezing CV: chest pain, edema, orthopnea, paroxysmal nocturnal dyspnea, palpitations GI: Denies abdominal pain, nausea, vomiting, diarrhea, hematochezia, melena, constipation, change in bowel habits GU: Denies dysuria, hematuria, polyuria, oliguria, urethral discharge Endocrine: Denies hot or cold intolerance, polyuria, polyphagia or appetite change Derm: Denies rash, dry skin, scaling or peeling skin change Heme: Denies easy bruising, bleeding, bleeding gums Neuro: Denies headache, numbness, weakness, slurred speech, loss of memory or consciousness   Past Medical History:  She,  has a past medical history of A-fib (HCC), CHF (congestive heart failure) (HCC), Chronic heart failure with preserved ejection fraction (HFpEF) (HCC), 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   Surgical History:   Past Surgical History:  Procedure Laterality Date   BASAL CELL CARCINOMA EXCISION  11/01/2022   CATARACT EXTRACTION W/PHACO Left 10/09/2021   Procedure: CATARACT EXTRACTION PHACO AND INTRAOCULAR LENS PLACEMENT (IOC) LEFT DIABETIC 4.84 00:37.8;  Surgeon: Clair Crews, MD;  Location: [facility];  Service: Ophthalmology;  Laterality: Left;  Diabetic   CATARACT EXTRACTION W/PHACO Right 10/30/2021   Procedure: CATARACT EXTRACTION PHACO AND INTRAOCULAR LENS PLACEMENT (IOC) RIGHT DIABETIC 5.65 00:38.5;  Surgeon: Clair Crews, MD;  Location:  [facility];  Service: Ophthalmology;  Laterality: Right;  Diabetic   CHOLECYSTECTOMY     DILATION AND CURETTAGE OF UTERUS     IR FLUORO GUIDE CV LINE RIGHT  08/16/2021   IR THORACENTESIS ASP PLEURAL SPACE W/IMG GUIDE  08/16/2021   PERICARDIOCENTESIS N/A 08/05/2021   Procedure: PERICARDIOCENTESIS;  Surgeon: Percival Brace, MD;  Location: [facility] INVASIVE CV LAB;  Service: Cardiovascular;  Laterality: N/A;   RIGHT HEART CATH N/A 03/22/2024   Procedure: RIGHT HEART CATH;  Surgeon: Wenona Hamilton, MD;  Location: [facility] INVASIVE CV LAB;  Service: Cardiovascular;  Laterality: N/A;   TEAR DUCT PROBING WITH STRABISMUS REPAIR Right    TONSILLECTOMY       Social History:   reports that she has never smoked. She has never used smokeless tobacco. She reports that she does not currently use alcohol. She reports that she does not use drugs.   Family History:  Her family history includes Breast cancer in her maternal grandmother and mother; Cancer in her brother and brother; Diabetes in her brother; Heart disease in her brother and mother; Heart disease (age of onset: 11) in her father; Osteoporosis in her mother; Parkinson's disease in her brother.   Allergies No Known Allergies   Home Medications  Prior to Admission medications   Medication Sig Start Date End Date Taking? Authorizing Provider  allopurinol  (ZYLOPRIM ) 100 MG tablet Take 1 tablet (100 mg total) by mouth daily. 12/19/23  Yes Johnson, [name], DO  apixaban  (ELIQUIS ) 5 MG TABS tablet Take 1 tablet (5 mg total) by mouth 2 (two) times daily. 12/19/23  Yes Johnson, [name], DO  empagliflozin  (JARDIANCE ) 25 MG TABS tablet TAKE 1 TABLET BY MOUTH ONCE DAILY BEFOREBREAKFAST  12/19/23  Yes Johnson, [name], DO  folic acid  (FOLVITE ) 1 MG tablet Take 1 tablet (1 mg total) by mouth daily. 09/30/23  Yes Johnson, [name], DO  insulin  glargine, 2 Unit Dial, (TOUJEO  MAX SOLOSTAR) 300 UNIT/ML Solostar Pen Inject 16-20 Units into the skin daily. 12/19/23 09/14/24 Yes Johnson, [name], DO  latanoprost  (XALATAN ) 0.005 % ophthalmic solution Place 1 drop into both eyes at bedtime.   Yes [provider]  metolazone  (ZAROXOLYN ) 2.5 MG tablet Take 2.5 mg by mouth once a week. Wednesday   Yes [provider]  metoprolol  succinate (TOPROL -XL) 25 MG 24 hr tablet Take 0.5 tablets (12.5 mg total) by mouth daily. 12/19/23  Yes Johnson, [name], DO  midodrine  (PROAMATINE ) 10 MG tablet TAKE 1 TABLET BY MOUTH 3 TIMES DAILY WITH MEALS 02/24/23  Yes Gollan, Timothy J, MD  pantoprazole  (PROTONIX ) 40 MG tablet Take 1 tablet (40 mg total) by mouth 2 (two) times daily. 12/19/23  Yes Johnson, [name], DO  potassium chloride  SA (KLOR-CON  M) 20 MEQ tablet Take 2 tablets (40 mEq total) by mouth 2 (two) times daily. 12/19/23  Yes Johnson, [name], DO  rosuvastatin  (CRESTOR ) 5 MG tablet Take 1 tablet (5 mg total) by mouth once a week. 05/30/23  Yes Johnson, [name], DO  Semaglutide  (RYBELSUS ) 7 MG TABS Take 1 tablet (7 mg total) by mouth daily. 12/19/23  Yes Johnson, [name], DO  spironolactone  (ALDACTONE ) 50 MG tablet Take 1 tablet (50 mg total) by mouth daily. 12/19/23  Yes Johnson, [name], DO  torsemide  (DEMADEX ) 20 MG tablet TAKE 1 TO 2 TABLETS BY MOUTH DAILY. ONLY TAKE 2 TABLETS FOR 1 TO 3 DAYS AND IF PERSISTS, CALL WITH BAD SWELLING 12/19/23  Yes Johnson, Megan  P, DO  Continuous Glucose Sensor (DEXCOM G7 SENSOR) MISC by Does not apply route.    [provider]  Glucagon  (GVOKE HYPOPEN  2-PACK) 0.5 MG/0.1ML SOAJ Inject 1 each into the skin daily as needed. Patient not taking: Reported on 03/19/2024 03/14/23   Terre Ferri P, DO  Insulin  Pen Needle (PEN NEEDLES) 32G X 4 MM MISC 1 each by  Does not apply route 3 (three) times daily. Patient not taking: Reported on 02/24/2024 01/30/23   Terre Ferri P, DO  Insulin  Pen Needle (ULTRACARE PEN NEEDLES) 32G X 4 MM MISC USE 1 PEN ONCE DAILY Patient not taking: Reported on 02/24/2024 10/11/22   Terre Ferri P, DO  Insulin  Syringe-Needle U-100 (INSULIN  SYRINGE 1CC/31GX5/16") 31G X 5/16" 1 ML MISC 1 each by Does not apply route 3 (three) times daily. Patient not taking: Reported on 02/24/2024 12/13/22   Terre Ferri P, DO  Insulin  Syringe/Needle U-500 (BD INSULIN  SYRINGE U-500) 31G X 0.5 ML MISC 1 each by Does not apply route 3 (three) times daily as needed. Patient not taking: Reported on 02/24/2024 03/14/23   Terre Ferri P, DO  metroNIDAZOLE (METROGEL) 1 % gel Apply to face every morning. Patient not taking: Reported on 03/19/2024 09/19/22   [provider]  NEEDLE, DISP, 30 G (BD DISP NEEDLES) 30G X 1/2" MISC by Does not apply route. Patient not taking: Reported on 02/24/2024    [provider]     Critical care time: 60 minutes      Janey Meek, AGNP  Pulmonary/Critical Care Pager [phone] (please enter 7 digits) PCCM Consult Pager [phone] (please enter 7 digits)

## 2024-03-22 NOTE — Progress Notes (Signed)
 [facility] [place], [place] 03/22/24  Subjective:   Hospital day # 3   Patient known to our practice from outpatient follow-up of CKD.  She presented to the emergency room with worsening shortness of breath with exertion.  Normally she is able to drive and carry on activities of daily living with ease.  For the last 2 to 3 weeks and especially last 4 days she has been getting short of breath and having to rest even to go from 1 room to the next.  She has developed worsening lower extremity edema.  She is now presented to the hospital for further management.   Patient seen in ICU after RHC Patient has been placed on Milrinone drip Remains on room air Husband at bedside.  Renal: 06/01 0701 - 06/02 0700 In: 1200 [P.O.:1200] Out: 800 [Urine:800] Lab Results  Component Value Date   CREATININE 2.43 (H) 03/22/2024   CREATININE 2.41 (H) 03/22/2024   CREATININE 2.50 (H) 03/21/2024     Objective:  Vital signs in last 24 hours:  Temp:  [97.1 F (36.2 C)-98.1 F (36.7 C)] 97.6 F (36.4 C) (06/02 1243) Pulse Rate:  [66-80] 71 (06/02 1243) Resp:  [13-21] 13 (06/02 1243) BP: (88-108)/(61-71) 103/68 (06/02 1200) SpO2:  [97 %-100 %] 97 % (06/02 1243) Weight:  [68.1 kg] 68.1 kg (06/02 1010)  Weight change: -0.6 kg Filed Weights   03/21/24 0500 03/22/24 0505 03/22/24 1010  Weight: 68.7 kg 68.1 kg 68.1 kg    Intake/Output:    Intake/Output Summary (Last 24 hours) at 03/22/2024 1401 Last data filed at 03/21/2024 2300 Gross per 24 hour  Intake 960 ml  Output --  Net 960 ml     Physical Exam: General: No acute distress  HEENT Moist mucous membranes  Pulm/lungs Normal breathing effort on room air  CVS/Heart Irregular rhythm  Abdomen:  Mildly distended  Extremities: 2+ pitting edema and lymphedema  Neurologic: Alert and oriented  Skin: No acute rashes          Basic Metabolic Panel:  Recent Labs  Lab 03/19/24 1038 03/19/24 2009 03/20/24 1336  03/20/24 2117 03/21/24 0954 03/22/24 0321 03/22/24 0910 03/22/24 1118  NA 127*   < > 130* 129* 128* 124* 126* 127*  K 4.4   < > 3.2* 3.4* 3.2* 2.7* 4.2 3.8  CL 90*   < > 91* 89* 88* 85* 87*  --   CO2 23   < > 24 24 23 24 26   --   GLUCOSE 159*   < > 182* 128* 124* 158* 140*  --   BUN 76*   < > 80* 83* 78* 80* 76*  --   CREATININE 2.81*   < > 2.65* 2.58* 2.50* 2.41* 2.43*  --   CALCIUM  9.5   < > 9.5 9.5 9.4 9.6 9.7  --   MG 2.3  --   --   --   --  2.8*  --   --   PHOS 4.7*  --   --   --   --   --   --   --    < > = values in this interval not displayed.     CBC: Recent Labs  Lab 03/19/24 1038 03/20/24 0420 03/21/24 0954 03/22/24 0321 03/22/24 1118  WBC 6.1 5.5 5.3 5.7  --   HGB 12.9 12.0 12.6 12.3 14.6  HCT 42.0 38.3 39.8 39.6 43.0  MCV 70.5* 68.8* 69.0* 68.9*  --   PLT 313  281 298 269  --       Lab Results  Component Value Date   HEPBSAG NON REACTIVE 03/21/2024   HEPBSAB NON REACTIVE 04/11/2021   HEPBIGM NON REACTIVE 03/21/2024      Microbiology:  No results found for this or any previous visit (from the past 240 hours).  Coagulation Studies: No results for input(s): "LABPROT", "INR" in the last 72 hours.  Urinalysis: No results for input(s): "COLORURINE", "LABSPEC", "PHURINE", "GLUCOSEU", "HGBUR", "BILIRUBINUR", "KETONESUR", "PROTEINUR", "UROBILINOGEN", "NITRITE", "LEUKOCYTESUR" in the last 72 hours.  Invalid input(s): "APPERANCEUR"    Imaging: US  EKG SITE RITE Result Date: 03/22/2024 If Site Rite image not attached, placement could not be confirmed due to current cardiac rhythm.  CARDIAC CATHETERIZATION Result Date: 03/22/2024 Successful right heart catheterization via the left basilic vein. Right heart catheterization showed severely elevated right atrial pressure, mild pulmonary hypertension, moderately elevated wedge pressure and severely reduced cardiac output. RA: 18 mmHg RV: 38/5 with an EDP of 22 mmHg PW: 26 mmHg PA: 38/22 with a mean of 30 mmHg PA  sat was 55%. Cardiac output was 3.06 with an index of 1.85. Recommendations: The patient has evidence of biventricular failure with prominent RV dysfunction.  She likely has underlying cardiorenal syndrome and not able to diurese with some form of inotropic therapy.  Recommend starting small milrinone drip and IV diuresis.  I discontinued Toprol  and midodrine .  Will consult advanced heart failure to help with management.   US  ASCITES (ABDOMEN LIMITED) Result Date: 03/20/2024 CLINICAL DATA:  92834 Cirrhosis (HCC) 92834 EXAM: LIMITED ABDOMEN ULTRASOUND FOR ASCITES TECHNIQUE: Limited ultrasound survey for ascites was performed in all four abdominal quadrants. COMPARISON:  August 01, 2021. FINDINGS: Targeted ultrasound performed of the 4 quadrants. No significant ascites is documented. Lobular and heterogeneous appearance of the liver consistent with history of cirrhosis. IMPRESSION: No significant ascites. Electronically Signed   By: Clancy Crimes M.D.   On: 03/20/2024 14:35     Medications:    milrinone 0.25 mcg/kg/min (03/22/24 1156)   norepinephrine (LEVOPHED) Adult infusion      allopurinol   100 mg Oral Daily   apixaban   5 mg Oral BID   folic acid   1 mg Oral Daily   furosemide   80 mg Intravenous Q12H   insulin  aspart  0-5 Units Subcutaneous QHS   insulin  aspart  0-9 Units Subcutaneous TID WC   insulin  glargine-yfgn  10 Units Subcutaneous QHS   latanoprost   1 drop Both Eyes QHS   pantoprazole   40 mg Oral BID   potassium chloride   10 mEq Oral TID   [START ON 03/26/2024] rosuvastatin   5 mg Oral Weekly   acetaminophen , albuterol , dextromethorphan-guaiFENesin , ondansetron  (ZOFRAN ) IV, mouth rinse, polyethylene glycol  Assessment/ Plan:  78 y.o. female with  multiple medical problems including chronic kidney disease, hypertension, atrial fibrillation, hyperlipidemia, chronic lower extremity edema, diastolic dysfunction, diabetes, osteoarthritis of the knee, chronic back pain  admitted on  03/19/2024 for Shortness of breath [R06.02] Acute on chronic diastolic heart failure (HCC) [I50.33] Acute on chronic diastolic CHF (congestive heart failure) (HCC) [I50.33]  1.  Acute on chronic diastolic CHF, with worsening lower extremity edema and shortness of breath 2.  Acute kidney injury on chronic kidney disease stage IV.  Baseline creatinine 2.6/GFR 19 from April 2025. Creatinine has worsened to 2.65, likely secondary to aggressive diuresis. 3.  Hypokalemia 4.  Hyponatremia. 5.  Diabetes type 2 with CKD.  Hemoglobin A1c 7.4% from February 2025.  2D echo 03/20/2024-LVEF 60 to 65%,  moderate concentric LVH, normal right ventricular systolic function small pericardial effusion, normal aortic valve, severe mitral annular calcification  Plan: RHC today showed severely decreased cardiac output, biventricular failure with RV dysfunction. Placed on Milrinone drip with IV diuresis. Creatinine stable today Potassium within range, 3.8 Glucose well controlled, SSI managed by primary team   LOS: 3 [facility] Aprille Sawhney 6/2/20252:01 PM  [facility] [place], [place] [phone]

## 2024-03-23 ENCOUNTER — Ambulatory Visit: Payer: Medicare PPO | Admitting: Family Medicine

## 2024-03-23 DIAGNOSIS — I4891 Unspecified atrial fibrillation: Secondary | ICD-10-CM | POA: Diagnosis not present

## 2024-03-23 DIAGNOSIS — I5033 Acute on chronic diastolic (congestive) heart failure: Secondary | ICD-10-CM | POA: Diagnosis not present

## 2024-03-23 DIAGNOSIS — I272 Pulmonary hypertension, unspecified: Secondary | ICD-10-CM | POA: Diagnosis not present

## 2024-03-23 DIAGNOSIS — I3139 Other pericardial effusion (noninflammatory): Secondary | ICD-10-CM | POA: Diagnosis not present

## 2024-03-23 LAB — POCT I-STAT EG7
Acid-Base Excess: 2 mmol/L (ref 0.0–2.0)
Bicarbonate: 26.2 mmol/L (ref 20.0–28.0)
Calcium, Ion: 1.19 mmol/L (ref 1.15–1.40)
HCT: 43 % (ref 36.0–46.0)
Hemoglobin: 14.6 g/dL (ref 12.0–15.0)
O2 Saturation: 55 %
Potassium: 3.8 mmol/L (ref 3.5–5.1)
Sodium: 126 mmol/L — ABNORMAL LOW (ref 135–145)
TCO2: 27 mmol/L (ref 22–32)
pCO2, Ven: 38 mmHg — ABNORMAL LOW (ref 44–60)
pH, Ven: 7.446 — ABNORMAL HIGH (ref 7.25–7.43)
pO2, Ven: 27 mmHg — CL (ref 32–45)

## 2024-03-23 LAB — BASIC METABOLIC PANEL WITH GFR
Anion gap: 15 (ref 5–15)
Anion gap: 16 — ABNORMAL HIGH (ref 5–15)
BUN: 67 mg/dL — ABNORMAL HIGH (ref 8–23)
BUN: 69 mg/dL — ABNORMAL HIGH (ref 8–23)
CO2: 24 mmol/L (ref 22–32)
CO2: 25 mmol/L (ref 22–32)
Calcium: 8.8 mg/dL — ABNORMAL LOW (ref 8.9–10.3)
Calcium: 9.4 mg/dL (ref 8.9–10.3)
Chloride: 82 mmol/L — ABNORMAL LOW (ref 98–111)
Chloride: 84 mmol/L — ABNORMAL LOW (ref 98–111)
Creatinine, Ser: 2.32 mg/dL — ABNORMAL HIGH (ref 0.44–1.00)
Creatinine, Ser: 2.37 mg/dL — ABNORMAL HIGH (ref 0.44–1.00)
GFR, Estimated: 21 mL/min — ABNORMAL LOW (ref >60)
GFR, Estimated: 21 mL/min — ABNORMAL LOW (ref >60)
Glucose, Bld: 375 mg/dL — ABNORMAL HIGH (ref 70–99)
Glucose, Bld: 208 mg/dL — ABNORMAL HIGH (ref 70–99)
Potassium: 2.9 mmol/L — ABNORMAL LOW (ref 3.5–5.1)
Potassium: 4.1 mmol/L (ref 3.5–5.1)
Sodium: 121 mmol/L — ABNORMAL LOW (ref 135–145)
Sodium: 125 mmol/L — ABNORMAL LOW (ref 135–145)

## 2024-03-23 LAB — CBC
HCT: 37.1 % (ref 36.0–46.0)
Hemoglobin: 11.5 g/dL — ABNORMAL LOW (ref 12.0–15.0)
MCH: 21.5 pg — ABNORMAL LOW (ref 26.0–34.0)
MCHC: 31 g/dL (ref 30.0–36.0)
MCV: 69.5 fL — ABNORMAL LOW (ref 80.0–100.0)
Platelets: 312 10*3/uL (ref 150–400)
RBC: 5.34 MIL/uL — ABNORMAL HIGH (ref 3.87–5.11)
RDW: 19.9 % — ABNORMAL HIGH (ref 11.5–15.5)
WBC: 7 10*3/uL (ref 4.0–10.5)
nRBC: 0 % (ref 0.0–0.2)

## 2024-03-23 LAB — ECHOCARDIOGRAM COMPLETE
AR max vel: 0.98 cm2
AV Area VTI: 1.1 cm2
AV Area mean vel: 1.03 cm2
AV Mean grad: 13.8 mmHg
AV Peak grad: 26.5 mmHg
Ao pk vel: 2.57 m/s
Area-P 1/2: 3.26 cm2
Height: 60 in
MV VTI: 1.32 cm2
S' Lateral: 2.4 cm
Weight: 2402.13 [oz_av]

## 2024-03-23 LAB — HEPATIC FUNCTION PANEL
ALT: 12 U/L (ref 0–44)
AST: 22 U/L (ref 15–41)
Albumin: 3.8 g/dL (ref 3.5–5.0)
Alkaline Phosphatase: 80 U/L (ref 38–126)
Bilirubin, Direct: 1 mg/dL — ABNORMAL HIGH (ref 0.0–0.2)
Indirect Bilirubin: 2 mg/dL — ABNORMAL HIGH (ref 0.3–0.9)
Total Bilirubin: 3 mg/dL — ABNORMAL HIGH (ref 0.0–1.2)
Total Protein: 6.4 g/dL — ABNORMAL LOW (ref 6.5–8.1)

## 2024-03-23 LAB — MAGNESIUM: Magnesium: 2.3 mg/dL (ref 1.7–2.4)

## 2024-03-23 LAB — COOXEMETRY PANEL
Carboxyhemoglobin: 1.7 % — ABNORMAL HIGH (ref 0.5–1.5)
Methemoglobin: <0.7 % (ref 0.0–1.5)
O2 Saturation: 66.5 %
Total hemoglobin: 12.1 g/dL (ref 12.0–16.0)
Total oxygen content: 65 %

## 2024-03-23 LAB — SODIUM, URINE, TIMED
Sodium, 24H Ur: 186 meq/d (ref 40–220)
Sodium, Ur: 41 mmol/L
Urine Total Volume-UNA24: 4525 mL

## 2024-03-23 LAB — GLUCOSE, CAPILLARY
Glucose-Capillary: 170 mg/dL — ABNORMAL HIGH (ref 70–99)
Glucose-Capillary: 282 mg/dL — ABNORMAL HIGH (ref 70–99)
Glucose-Capillary: 212 mg/dL — ABNORMAL HIGH (ref 70–99)
Glucose-Capillary: 178 mg/dL — ABNORMAL HIGH (ref 70–99)
Glucose-Capillary: 200 mg/dL — ABNORMAL HIGH (ref 70–99)

## 2024-03-23 MED ORDER — POTASSIUM CHLORIDE CRYS ER 20 MEQ PO TBCR
40.0000 meq | EXTENDED_RELEASE_TABLET | ORAL | Status: DC
  Administered 2024-03-23 (×2): 40 meq via ORAL
  Filled 2024-03-23 (×2): qty 2

## 2024-03-23 MED ORDER — POTASSIUM CHLORIDE 10 MEQ/50ML IV SOLN
10.0000 meq | INTRAVENOUS | Status: AC
  Administered 2024-03-23 (×6): 10 meq via INTRAVENOUS
  Filled 2024-03-23 (×6): qty 50

## 2024-03-23 MED ORDER — FUROSEMIDE 10 MG/ML IJ SOLN
80.0000 mg | Freq: Two times a day (BID) | INTRAMUSCULAR | Status: DC
  Administered 2024-03-24 – 2024-03-27 (×8): 80 mg via INTRAVENOUS
  Filled 2024-03-23 (×8): qty 8

## 2024-03-23 MED ORDER — OXYCODONE HCL 5 MG PO TABS
5.0000 mg | ORAL_TABLET | Freq: Four times a day (QID) | ORAL | Status: DC | PRN
  Administered 2024-03-23 – 2024-04-03 (×9): 5 mg via ORAL
  Filled 2024-03-23 (×10): qty 1

## 2024-03-23 MED ORDER — INSULIN GLARGINE-YFGN 100 UNIT/ML ~~LOC~~ SOLN
10.0000 [IU] | Freq: Two times a day (BID) | SUBCUTANEOUS | Status: DC
  Administered 2024-03-23 – 2024-04-03 (×21): 10 [IU] via SUBCUTANEOUS
  Filled 2024-03-23 (×25): qty 0.1

## 2024-03-23 MED ORDER — TRAMADOL HCL 50 MG PO TABS
50.0000 mg | ORAL_TABLET | Freq: Four times a day (QID) | ORAL | Status: DC | PRN
  Administered 2024-03-23: 50 mg via ORAL
  Filled 2024-03-23: qty 1

## 2024-03-23 MED ORDER — SENNA 8.6 MG PO TABS
1.0000 | ORAL_TABLET | Freq: Every day | ORAL | Status: AC
  Administered 2024-03-23 – 2024-03-25 (×3): 8.6 mg via ORAL
  Filled 2024-03-23 (×3): qty 1

## 2024-03-23 MED ORDER — TOLVAPTAN 15 MG PO TABS
15.0000 mg | ORAL_TABLET | Freq: Once | ORAL | Status: AC
  Administered 2024-03-23: 15 mg via ORAL
  Filled 2024-03-23: qty 1

## 2024-03-23 MED ORDER — TRAZODONE HCL 50 MG PO TABS
75.0000 mg | ORAL_TABLET | Freq: Every evening | ORAL | Status: DC | PRN
  Administered 2024-03-23 – 2024-04-02 (×6): 75 mg via ORAL
  Filled 2024-03-23 (×7): qty 2

## 2024-03-23 MED ORDER — ENSURE PLUS HIGH PROTEIN PO LIQD
237.0000 mL | Freq: Two times a day (BID) | ORAL | Status: DC
  Administered 2024-03-23 – 2024-04-03 (×14): 237 mL via ORAL

## 2024-03-23 MED ORDER — TRAMADOL HCL 50 MG PO TABS
50.0000 mg | ORAL_TABLET | Freq: Four times a day (QID) | ORAL | Status: DC | PRN
  Administered 2024-03-24 – 2024-04-03 (×8): 50 mg via ORAL
  Filled 2024-03-23 (×8): qty 1

## 2024-03-23 MED ORDER — FUROSEMIDE 10 MG/ML IJ SOLN
80.0000 mg | Freq: Two times a day (BID) | INTRAMUSCULAR | Status: DC
  Administered 2024-03-23: 80 mg via INTRAVENOUS
  Filled 2024-03-23: qty 8

## 2024-03-23 MED ORDER — NOREPINEPHRINE 16 MG/250ML-% IV SOLN
0.0000 ug/min | INTRAVENOUS | Status: DC
  Administered 2024-03-23: 10 ug/min via INTRAVENOUS
  Filled 2024-03-23: qty 250

## 2024-03-23 MED ORDER — POTASSIUM CHLORIDE CRYS ER 20 MEQ PO TBCR: 40.0000 meq | EXTENDED_RELEASE_TABLET | Freq: Two times a day (BID) | ORAL | Status: DC

## 2024-03-23 MED ORDER — NOREPINEPHRINE 16 MG/250ML-% IV SOLN
0.0000 ug/min | INTRAVENOUS | Status: DC
  Administered 2024-03-24: 6 ug/min via INTRAVENOUS
  Administered 2024-03-25: 7 ug/min via INTRAVENOUS
  Administered 2024-03-29: 9 ug/min via INTRAVENOUS
  Filled 2024-03-23 (×5): qty 250

## 2024-03-23 NOTE — Inpatient Diabetes Management (Incomplete)
 Inpatient Diabetes Program Recommendations  AACE/ADA: New Consensus Statement on Inpatient Glycemic Control (2015)  Target Ranges:  Prepandial:   less than 140 mg/dL      Peak postprandial:   less than 180 mg/dL (1-2 hours)      Critically ill patients:  140 - 180 mg/dL   Lab Results  Component Value Date   GLUCAP 282 (H) 03/23/2024   HGBA1C 7.4 (H) 12/19/2023    Review of Glycemic Control  Latest Reference Range & Units 03/23/24 08:02 03/23/24 11:32  Glucose-Capillary 70 - 99 mg/dL 518 (H) 841 (H)  (H): Data is abnormally high  Diabetes history: DM2 Outpatient Diabetes medications: Toujeo  16-20, Jardiance  25 mg every day, Rybelsus  7 mg every day Current orders for Inpatient glycemic control: Semglee  10 units every day, Novolog  0-9 units TID & 0-5 units QHS  Inpatient Diabetes Program Recommendations:    If postprandials remain elevated, might consider:  Novolog  3 units TID with meals if she consumes at least 50%.  Thank you, Hays Lipschutz, MSN, CDCES Diabetes Coordinator Inpatient Diabetes Program [phone] (team pager from 8a-5p)

## 2024-03-23 NOTE — Progress Notes (Signed)
 NAME:  Karen Farley, MRN:  [id number], DOB:  09/18/1946, LOS: 4 ADMISSION DATE:  03/19/2024, CONSULTATION DATE: 03/22/2024 REFERRING MD: Dr. Alvenia Aus, CHIEF COMPLAINT: Shortness of Breath   History of Present Illness:  This is a 78 yo female who presented to [facility] ER on 05/30 from home with abdominal distention, leg swelling, and shortness of breath with exertion onset of symptoms several weeks ago.  Pt reported she had been unable to tolerate laying flat at night due to shortness of breath.  At baseline she uses a walker to ambulate, but has only been able to take a few steps due to symptoms outlined above.  She also endorsed a 15 lb weight gain from her baseline over a 3 week period.  She has been compliant with her outpatient torsemide  and metolazone , and she recently has taken a double dose of her torsemide  recently.  Due to worsening symptoms pt proceeded to the ER for evaluation.  All hx obtained from EDP note.  ED Course Upon arrival to the ER significant lab results were: Na+ 127/glucose 159/BUN 76/creatinine 2.81/phosphorous 4.7/total bilirubin 2.1/BNP 265.4/troponin 26.  CT Chest revealed borderline cardiomegaly with mild diffuse pericardial thickening vs. small volume fluid; esophagitis vs. reflux; and suspected liver cirrhosis.  CXR negative for acute cardiopulmonary disease.  She received 40 mg iv lasix .  She was subsequently admitted to the telemetry unit for additional workup and treatment by hospitalist team.  See detailed hospital course below under significant events.    Pertinent  Medical History  Permanent Atrial Fibrillation  HFpEF CKD stage III Type I Diabetes Mellitus  HLD HTN  Lymphedema  Morbid Obesity  Osteopenia Pericardial Effusion   Significant Hospital Events: Including procedures, antibiotic start and stop dates in addition to other pertinent events   05/30: Pt admitted to the telemetry unit with acute on chronic respiratory failure secondary to acute on  chronic diastolic CHF  05/31: No significant ascites.  Lobular and heterogeneous appearance of the liver consistent        with history of cirrhosis.  Due to worsening renal failure lasix  held  05/31: Echo revealed 60 to 65%; echogenic material in the pericardium lateral to the RV apex that has been visualized on prior echo may represent fat pad vs. prior hemopericardium; small pericardial effusion without tamponade; mild mitral valve regurgitation; mild mitral stenosis; severe thickening of aortic valve  06/02: Right heart catheterization revealed severely elevated right atrial pressure, mild pulmonary hypertension, moderately elevated wedge pressure, and severely reduced cardiac output.  Pt transferred to ICU to start milrinone gtt, iv diuresis, and levophed gtt.  PCCM team consulted to assist with management.  06/03: Pt remains on levophed and milrinone gtts overnight UOP 1.3L   Interim History / Subjective:  Pt c/o of continued insomnia despite prn trazodone.  She also states she continues to have back pain despite tylenol , tramadol , and kpad   Objective    Blood pressure 90/75, pulse 86, temperature 98.1 F (36.7 C), temperature source Oral, resp. rate (!) 26, height 5' (1.524 m), weight 69.9 kg, SpO2 96%. CVP:  [10 mmHg-21 mmHg] 21 mmHg      Intake/Output Summary (Last 24 hours) at 03/23/2024 0723 Last data filed at 03/23/2024 0650 Gross per 24 hour  Intake 353.62 ml  Output 2250 ml  Net -1896.38 ml   Filed Weights   03/22/24 1010 03/22/24 1300 03/23/24 0425  Weight: 68.1 kg 70.3 kg 69.9 kg    Examination: General: Chronically-ill appearing female, NAD on RA  HENT: Supple, no JVD  Lungs: Clear throughout, even, non labored  Cardiovascular: Irregular irregular, no r/g, 2+ radial/1+ distal pulses, 2+ bilateral lower extremity pitting edema  Abdomen: +BS x4, obese, soft, non tender, non distended  Extremities: Moves all extremities  Skin: No rashes or lesions present  Neuro: Alert  and oriented, following commands, PERRLA  GU: Indwelling foley catheter draining yellow urine   Resolved problem list   Assessment and Plan   #Acute pain  #Insomnia  - Prn tylenol , tramadol , or oxycodone for pain management  - Prn Kpad for back pain  - Prn trazodone for insomnia  - Maintain sleep/wake cycle   #Acute chronic diastolic CHF  #Mild pulmonary hypertension  #Permanent atrial fibrillation  #Small pericardial effusion  #Mitral regurgitation  #Cardiorenal syndrome  Hx: Pericardial effusion, HLD, and HTN  RHC 03/22/2024: showed severely decreased cardiac output, biventricular failure with RV dysfunction.  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  #Hypokalemia  - Trend BMP  - Replete electrolytes as indicated  - Strict I&O's - Avoid nephrotoxic agents as able  - Nephrology consulted appreciate input  - Urine sodium and urine osmolality pending   #Acute respiratory failure secondary to CHF exacerbation  - Supplemental O2 for dyspnea and/or hypoxia  - Maintain O2 sats 92% or higher  - Prn bronchodilator therapy   #H/o Cirrhosis  - Trend hepatic function panel  - Avoid hepatotoxic agents as able   #Type II diabetes mellitus  - CBG's ac/hs  - SSI and scheduled semglee   - Follow hypo/hyperglycemic protocol  - Target range 140 to 180   Best Practice (right click and "Reselect all SmartList Selections" daily)   Diet/type: Regular consistency (see orders) DVT prophylaxis Apixaban   Pressure ulcer(s): N/A GI prophylaxis: PPI Lines: Left upper arm PICC line and still needed  Foley: Yes and still needed  Code Status:  full code Last date of multidisciplinary goals  of care discussion [03/23/2024]  06/03: Updated pt regarding pts condition and current plan of care.  All questions answered  Labs   CBC: Recent Labs  Lab 03/19/24 1038 03/20/24 0420 03/21/24 0954 03/22/24 0321 03/22/24 1118 03/23/24 0355  WBC 6.1 5.5 5.3 5.7  --  7.0  HGB 12.9 12.0 12.6 12.3 14.6  14.6 11.5*  HCT 42.0 38.3 39.8 39.6 43.0  43.0 37.1  MCV 70.5* 68.8* 69.0* 68.9*  --  69.5*  PLT 313 281 298 269  --  312    Basic Metabolic Panel: Recent Labs  Lab 03/19/24 1038 03/19/24 2009 03/20/24 2117 03/21/24 0954 03/22/24 0321 03/22/24 0910 03/22/24 1118 03/23/24 0355  NA 127*   < > 129* 128* 124* 126* 126*  127* 121*  K 4.4   < > 3.4* 3.2* 2.7* 4.2 3.8  3.8 2.9*  CL 90*   < > 89* 88* 85* 87*  --  82*  CO2 23   < > 24 23 24 26   --  24  GLUCOSE 159*   < > 128* 124* 158* 140*  --  375*  BUN 76*   < > 83* 78* 80* 76*  --  67*  CREATININE 2.81*   < > 2.58* 2.50* 2.41* 2.43*  --  2.32*  CALCIUM  9.5   < > 9.5 9.4 9.6 9.7  --  8.8*  MG 2.3  --   --   --  2.8*  --   --  2.3  PHOS 4.7*  --   --   --   --   --   --   --    < > = values in this interval not displayed.   GFR: Estimated Creatinine Clearance: 17.7 mL/min (A) (by C-G formula based on SCr of 2.32 mg/dL (H)). Recent Labs  Lab 03/20/24 0420 03/21/24 0954 03/22/24 0321 03/23/24 0355  WBC 5.5 5.3 5.7 7.0    Liver Function Tests: Recent Labs  Lab 03/19/24 1038  AST 19  ALT 16  ALKPHOS 88  BILITOT 2.1*  PROT 6.3*  ALBUMIN  3.8   No results for input(s): "LIPASE", "AMYLASE" in the last 168 hours. No results for input(s): "AMMONIA" in the last 168 hours.  ABG    Component Value Date/Time   HCO3 26.9 03/22/2024 1118   HCO3 26.2 03/22/2024 1118   TCO2 28 03/22/2024 1118   TCO2 27 03/22/2024 1118   ACIDBASEDEF 8.7 (H) 04/11/2021 0210   O2SAT 66.5 03/23/2024 0500     Coagulation Profile: No results for input(s): "INR", "PROTIME" in the last 168 hours.  Cardiac Enzymes: No results for  input(s): "CKTOTAL", "CKMB", "CKMBINDEX", "TROPONINI" in the last 168 hours.  HbA1C: Hemoglobin A1C  Date/Time Value Ref Range Status  06/28/2016 12:00 AM 6.6  Final   HB A1C (BAYER DCA - WAIVED)  Date/Time Value Ref Range Status  12/19/2023 04:24 PM 7.4 (H) 4.8 - 5.6 % Final    Comment:             Prediabetes: 5.7 - 6.4          Diabetes: >6.4          Glycemic control for adults with diabetes: <7.0   09/02/2023 01:35 PM 6.3 (H) 4.8 - 5.6 % Final    Comment:             Prediabetes: 5.7 - 6.4          Diabetes: >6.4          Glycemic control for adults with diabetes: <7.0     CBG: Recent Labs  Lab 03/22/24 1011 03/22/24 1152 03/22/24 1237 03/22/24 1754 03/22/24 2145  GLUCAP 145* 133* 130* 163* 198*    Review of Systems: Positives in BOLD   Gen: back pain, fever, chills, weight change, fatigue, night sweats HEENT: Denies blurred vision, double vision, hearing loss, tinnitus, sinus congestion, rhinorrhea, sore throat, neck stiffness, dysphagia PULM: shortness of breath, cough, sputum production, hemoptysis, wheezing CV: chest pain, edema, orthopnea, paroxysmal nocturnal dyspnea, palpitations GI: Denies abdominal pain, nausea, vomiting, diarrhea, hematochezia, melena, constipation, change in bowel habits GU: Denies dysuria, hematuria, polyuria, oliguria, urethral discharge Endocrine: Denies hot or cold intolerance, polyuria, polyphagia or appetite change Derm: Denies rash, dry skin, scaling or peeling skin change Heme: Denies easy bruising, bleeding, bleeding gums Neuro: Denies headache, numbness, weakness, slurred speech, loss of memory or consciousness   Past Medical History:  She,  has a past medical history of A-fib (HCC), CHF (congestive heart failure) (HCC), Chronic heart failure with preserved ejection fraction (HFpEF) (HCC), 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), DDD (degenerative disc disease), lumbar, Degenerative disc disease, lumbar, Diabetes  mellitus without complication (HCC), GIB (gastrointestinal bleeding), Hyperlipidemia, Hypertension, Lymphedema, Morbid obesity (HCC), Osteoarthritis of both knees,  Osteopenia, Pericardial effusion, Permanent atrial fibrillation (HCC), and Pleural effusion, left.   Surgical History:   Past Surgical History:  Procedure Laterality Date   BASAL CELL CARCINOMA EXCISION  11/01/2022   CATARACT EXTRACTION W/PHACO Left 10/09/2021   Procedure: CATARACT EXTRACTION PHACO AND INTRAOCULAR LENS PLACEMENT (IOC) LEFT DIABETIC 4.84 00:37.8;  Surgeon: Clair Crews, MD;  Location: [facility];  Service: Ophthalmology;  Laterality: Left;  Diabetic   CATARACT EXTRACTION W/PHACO Right 10/30/2021   Procedure: CATARACT EXTRACTION PHACO AND INTRAOCULAR LENS PLACEMENT (IOC) RIGHT DIABETIC 5.65 00:38.5;  Surgeon: Clair Crews, MD;  Location: [facility];  Service: Ophthalmology;  Laterality: Right;  Diabetic   CHOLECYSTECTOMY     DILATION AND CURETTAGE OF UTERUS     IR FLUORO GUIDE CV LINE RIGHT  08/16/2021   IR THORACENTESIS ASP PLEURAL SPACE W/IMG GUIDE  08/16/2021   PERICARDIOCENTESIS N/A 08/05/2021   Procedure: PERICARDIOCENTESIS;  Surgeon: Percival Brace, MD;  Location: [facility]VAS[facility] CV [facility];  Service: Cardiovascular;  Laterality: N/A;   RIGHT HEART CATH N/A 03/22/2024   Procedure: RIGHT HEART CATH;  Surgeon: Wenona Hamilton, MD;  Location: [facility] CV [facility];  Service: Cardiovascular;  Laterality: N/A;   TEAR DUCT PROBING WITH STRABISMUS REPAIR Right    TONSILLECTOMY       Social History:   reports that she has never smoked. She has never used smokeless tobacco. She reports that she does not currently use alcohol. She reports that she does not use drugs.   Family History:  Her family history includes Breast cancer in her maternal grandmother and mother; Cancer in her brother and brother; Diabetes in her brother; Heart disease in her brother and mother; Heart disease (age of  onset: 41) in her father; Osteoporosis in her mother; Parkinson's disease in her brother.   Allergies No Known Allergies   Home Medications  Prior to Admission medications   Medication Sig Start Date End Date Taking? Authorizing Provider  allopurinol  (ZYLOPRIM ) 100 MG tablet Take 1 tablet (100 mg total) by mouth daily. 12/19/23  Yes Johnson, [name], DO  apixaban  (ELIQUIS ) 5 MG TABS tablet Take 1 tablet (5 mg total) by mouth 2 (two) times daily. 12/19/23  Yes Johnson, [name], DO  empagliflozin  (JARDIANCE ) 25 MG TABS tablet TAKE 1 TABLET BY MOUTH ONCE DAILY BEFOREBREAKFAST 12/19/23  Yes Johnson, [name], DO  folic acid  (FOLVITE ) 1 MG tablet Take 1 tablet (1 mg total) by mouth daily. 09/30/23  Yes Johnson, [name], DO  insulin  glargine, 2 Unit Dial, (TOUJEO  MAX SOLOSTAR) 300 UNIT/ML Solostar Pen Inject 16-20 Units into the skin daily. 12/19/23 09/14/24 Yes Johnson, [name], DO  latanoprost  (XALATAN ) 0.005 % ophthalmic solution Place 1 drop into both eyes at bedtime.   Yes [provider]  metolazone  (ZAROXOLYN ) 2.5 MG tablet Take 2.5 mg by mouth once a week. Wednesday   Yes [provider]  metoprolol  succinate (TOPROL -XL) 25 MG 24 hr tablet Take 0.5 tablets (12.5 mg total) by mouth daily. 12/19/23  Yes Johnson, [name], DO  midodrine  (PROAMATINE ) 10 MG tablet TAKE 1 TABLET BY MOUTH 3 TIMES DAILY WITH MEALS 02/24/23  Yes Gollan, Timothy J, MD  pantoprazole  (PROTONIX ) 40 MG tablet Take 1 tablet (40 mg total) by mouth 2 (two) times daily. 12/19/23  Yes Johnson, [name], DO  potassium chloride  SA (KLOR-CON  M) 20 MEQ tablet Take 2 tablets (40 mEq total) by mouth 2 (two) times daily. 12/19/23  Yes Johnson, [name], DO  rosuvastatin  (  CRESTOR ) 5 MG tablet Take 1 tablet (5 mg total) by mouth once a week. 05/30/23  Yes Johnson, [name], DO  Semaglutide  (RYBELSUS ) 7 MG TABS Take 1 tablet (7 mg total) by mouth daily. 12/19/23  Yes Johnson, [name], DO  spironolactone  (ALDACTONE ) 50 MG tablet Take 1  tablet (50 mg total) by mouth daily. 12/19/23  Yes Johnson, [name], DO  torsemide  (DEMADEX ) 20 MG tablet TAKE 1 TO 2 TABLETS BY MOUTH DAILY. ONLY TAKE 2 TABLETS FOR 1 TO 3 DAYS AND IF PERSISTS, CALL WITH BAD SWELLING 12/19/23  Yes Johnson, [name], DO  Continuous Glucose Sensor (DEXCOM G7 SENSOR) MISC by Does not apply route.    [provider]  Glucagon  (GVOKE HYPOPEN  2-PACK) 0.5 MG/0.1ML SOAJ Inject 1 each into the skin daily as needed. Patient not taking: Reported on 03/19/2024 03/14/23   Terre Ferri P, DO  Insulin  Pen Needle (PEN NEEDLES) 32G X 4 MM MISC 1 each by Does not apply route 3 (three) times daily. Patient not taking: Reported on 02/24/2024 01/30/23   Terre Ferri P, DO  Insulin  Pen Needle (ULTRACARE PEN NEEDLES) 32G X 4 MM MISC USE 1 PEN ONCE DAILY Patient not taking: Reported on 02/24/2024 10/11/22   Terre Ferri P, DO  Insulin  Syringe-Needle U-100 (INSULIN  SYRINGE 1CC/31GX5/16") 31G X 5/16" 1 ML MISC 1 each by Does not apply route 3 (three) times daily. Patient not taking: Reported on 02/24/2024 12/13/22   Terre Ferri P, DO  Insulin  Syringe/Needle U-500 (BD INSULIN  SYRINGE U-500) 31G X 0.5 ML MISC 1 each by Does not apply route 3 (three) times daily as needed. Patient not taking: Reported on 02/24/2024 03/14/23   Terre Ferri P, DO  metroNIDAZOLE (METROGEL) 1 % gel Apply to face every morning. Patient not taking: Reported on 03/19/2024 09/19/22   [provider]  NEEDLE, DISP, 30 G (BD DISP NEEDLES) 30G X 1/2" MISC by Does not apply route. Patient not taking: Reported on 02/24/2024    [provider]     Critical care time: 40 minutes      Janey Meek, AGNP  Pulmonary/Critical Care Pager [phone] (please enter 7 digits) PCCM Consult Pager [phone] (please enter 7 digits)

## 2024-03-23 NOTE — Progress Notes (Signed)
 Advanced Heart Failure Rounding Note   Subjective:    Remains on NE 13 and milrinone 0.25  SBP 90s. Co-ox 67%  CVP 15  Diuresing well on IV lasix . Out 2.2 L   Na 121 K 2.9 Scr 2.4-> 2.3  Says she is not sure if she feels better or not. Had a lot of back pain last night.    Objective:   Weight Range:  Vital Signs:   Temp:  [97.5 F (36.4 C)-98.4 F (36.9 C)] 98.1 F (36.7 C) (06/03 0400) Pulse Rate:  [61-95] 86 (06/03 0700) Resp:  [11-30] 26 (06/03 0700) BP: (68-119)/(39-86) 90/75 (06/03 0700) SpO2:  [94 %-100 %] 96 % (06/03 0700) Weight:  [68.1 kg-70.3 kg] 69.9 kg (06/03 0425) Last BM Date :  (PTA)  Weight change: Filed Weights   03/22/24 1010 03/22/24 1300 03/23/24 0425  Weight: 68.1 kg 70.3 kg 69.9 kg    Intake/Output:   Intake/Output Summary (Last 24 hours) at 03/23/2024 0839 Last data filed at 03/23/2024 0800 Gross per 24 hour  Intake 353.62 ml  Output 2575 ml  Net -2221.38 ml     Physical Exam: General:  Elderly No resp difficulty HEENT: normal Neck: supple. JVP to ear  .+ bruits Cor: . Irregular rate & rhythm. 2/6 AS Lungs: clear Abdomen: soft, nontender, nondistended. No hepatosplenomegaly. No bruits or masses. Good bowel sounds. Extremities: no cyanosis, clubbing, rash, 2+ edema Neuro: alert & orientedx3, cranial nerves grossly intact. moves all 4 extremities w/o difficulty. Affect pleasant  Telemetry: AF 80-90s Personally reviewed  Labs: Basic Metabolic Panel: Recent Labs  Lab 03/19/24 1038 03/19/24 2009 03/20/24 2117 03/21/24 0954 03/22/24 0321 03/22/24 0910 03/22/24 1118 03/23/24 0355  NA 127*   < > 129* 128* 124* 126* 126*  127* 121*  K 4.4   < > 3.4* 3.2* 2.7* 4.2 3.8  3.8 2.9*  CL 90*   < > 89* 88* 85* 87*  --  82*  CO2 23   < > 24 23 24 26   --  24  GLUCOSE 159*   < > 128* 124* 158* 140*  --  375*  BUN 76*   < > 83* 78* 80* 76*  --  67*  CREATININE 2.81*   < > 2.58* 2.50* 2.41* 2.43*  --  2.32*  CALCIUM  9.5   < > 9.5 9.4  9.6 9.7  --  8.8*  MG 2.3  --   --   --  2.8*  --   --  2.3  PHOS 4.7*  --   --   --   --   --   --   --    < > = values in this interval not displayed.    Liver Function Tests: Recent Labs  Lab 03/19/24 1038  AST 19  ALT 16  ALKPHOS 88  BILITOT 2.1*  PROT 6.3*  ALBUMIN  3.8   No results for input(s): "LIPASE", "AMYLASE" in the last 168 hours. No results for input(s): "AMMONIA" in the last 168 hours.  CBC: Recent Labs  Lab 03/19/24 1038 03/20/24 0420 03/21/24 0954 03/22/24 0321 03/22/24 1118 03/23/24 0355  WBC 6.1 5.5 5.3 5.7  --  7.0  HGB 12.9 12.0 12.6 12.3 14.6  14.6 11.5*  HCT 42.0 38.3 39.8 39.6 43.0  43.0 37.1  MCV 70.5* 68.8* 69.0* 68.9*  --  69.5*  PLT 313 281 298 269  --  312    Cardiac Enzymes: No results for input(s): "CKTOTAL", "CKMB", "  CKMBINDEX", "TROPONINI" in the last 168 hours.  BNP: BNP (last 3 results) Recent Labs    12/19/23 1624 03/19/24 1038  BNP 132.4* 265.4*    ProBNP (last 3 results) No results for input(s): "PROBNP" in the last 8760 hours.    Other results:  Imaging: DG Chest Port 1 View Result Date: 03/22/2024 CLINICAL DATA:  PICC line placement EXAM: PORTABLE CHEST 1 VIEW COMPARISON:  03/19/2024 FINDINGS: A left PICC catheter has been placed with tip projecting over the cavoatrial junction region. No pneumothorax. Cardiac enlargement. Lungs are clear. No pleural effusion. Mediastinal contours appear intact. IMPRESSION: Left PICC catheter with tip projecting over the cavoatrial junction region. No pneumothorax. Cardiac enlargement. No evidence of active pulmonary disease. Electronically Signed   By: Boyce Byes M.D.   On: 03/22/2024 18:05   US  EKG SITE RITE Result Date: 03/22/2024 If Site Rite image not attached, placement could not be confirmed due to current cardiac rhythm.  CARDIAC CATHETERIZATION Result Date: 03/22/2024 Successful right heart catheterization via the left basilic vein. Right heart catheterization showed  severely elevated right atrial pressure, mild pulmonary hypertension, moderately elevated wedge pressure and severely reduced cardiac output. RA: 18 mmHg RV: 38/5 with an EDP of 22 mmHg PW: 26 mmHg PA: 38/22 with a mean of 30 mmHg PA sat was 55%. Cardiac output was 3.06 with an index of 1.85. Recommendations: The patient has evidence of biventricular failure with prominent RV dysfunction.  She likely has underlying cardiorenal syndrome and not able to diurese with some form of inotropic therapy.  Recommend starting small milrinone drip and IV diuresis.  I discontinued Toprol  and midodrine .  Will consult advanced heart failure to help with management.     Medications:     Scheduled Medications:  allopurinol   100 mg Oral Daily   apixaban   5 mg Oral BID   Chlorhexidine  Gluconate Cloth  6 each Topical Daily   docusate sodium   100 mg Oral BID   folic acid   1 mg Oral Daily   furosemide   80 mg Intravenous BID   insulin  aspart  0-5 Units Subcutaneous QHS   insulin  aspart  0-9 Units Subcutaneous TID WC   insulin  glargine-yfgn  10 Units Subcutaneous BID   latanoprost   1 drop Both Eyes QHS   pantoprazole   40 mg Oral BID   polyethylene glycol  17 g Oral Daily   potassium chloride   40 mEq Oral Q4H   [START ON 03/26/2024] rosuvastatin   5 mg Oral Weekly   sodium chloride  flush  10-40 mL Intracatheter Q12H    Infusions:  milrinone 0.25 mcg/kg/min (03/23/24 0650)   norepinephrine (LEVOPHED) Adult infusion     potassium chloride  10 mEq (03/23/24 0813)    PRN Medications: acetaminophen , albuterol , bisacodyl , dextromethorphan-guaiFENesin , ondansetron  (ZOFRAN ) IV, mouth rinse, oxyCODONE, sodium chloride  flush, traMADol , traZODone   Assessment/Plan:   1. Acute on Chronic Biventricular Heart Failure; HFpEF; Predominantly RV Failure - EF 60-65% with mod-severe AS normal RV on echo read, however RHC with low output biventricular heart failure - RHC 03/22/24: RA 18, PA 38/22 (30), PCW 26, CO/CI 3.06/1.85,  PAPi 0.89 - on NE 13 and milrinone 0.25 SBP 90s. Co-ox 67%  Diuresing well on IV lasix . Out 2.2 L  - continue Lasix  80 mg IV bid. 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   -  Agree with short-course of inotropic/pressor support to see how well we can optimize her but suspect long-term options are quite limited. - Prior to d/c will need cMRI to look for infiltrative disease  2. AKI on CKD 3b:  - baseline Cr ~2.0. Up to 2.8 on admission - improving with diuresis and inotrope/pressor support. - nephrology following, appreciate recs  3. Moderate to severe AS/moderate MR - follow   4. Small pericardial Effusion - h/o pericardial effusion s/p pericardiocentesis 700 ml in 07/2021. Bloody, neg for malignancy. - small w/o tamponade on echo - continue to monitor for signs of tamponade   5. Atrial Fibrillation: Permanent - rate controlled on tele - hold ? blocker with milrinone and low output - continue Eliquis  5 mg bid   7. Hyponatremia - diuresis as above - Add tolvaptan today   8.Hypokalemia - will supp. Can add back spiro as BP toelrates - continue replacement for goal K>4   9. H/o cirrhosis - s/p paracentesis in 2022 - CT chest with suspected liver cirrhosis - suspect this was/is in the setting of cardiohepatic syndrome - AST/ALT nl on admit  CRITICAL CARE Performed by: Jules Oar  Total critical care time: 45 minutes  Critical care time was exclusive of separately billable procedures and treating other patients.  Critical care was necessary to treat or prevent imminent or life-threatening deterioration.  Critical care was time spent personally by me (independent of midlevel providers or residents) on the following activities: development of treatment plan with patient and/or surrogate as well as nursing,  discussions with consultants, evaluation of patient's response to treatment, examination of patient, obtaining history from patient or surrogate, ordering and performing treatments and interventions, ordering and review of laboratory studies, ordering and review of radiographic studies, pulse oximetry and re-evaluation of patient's condition.    Length of Stay: 4   Jules Oar MD 03/23/2024, 8:39 AM  Advanced Heart Failure Team Pager [phone] (M-F; 7a - 4p)  Please contact [facility] for night-coverage after hours (4p -7a ) and weekends on [email or web address]

## 2024-03-23 NOTE — Plan of Care (Signed)
  Problem: Fluid Volume: Goal: Ability to maintain a balanced intake and output will improve Outcome: Progressing   Problem: Nutritional: Goal: Maintenance of adequate nutrition will improve Outcome: Progressing   Problem: Education: Goal: Knowledge of General Education information will improve Description: Including pain rating scale, medication(s)/side effects and non-pharmacologic comfort measures Outcome: Progressing   Problem: Health Behavior/Discharge Planning: Goal: Ability to manage health-related needs will improve Outcome: Progressing   Problem: Clinical Measurements: Goal: Diagnostic test results will improve Outcome: Progressing Goal: Cardiovascular complication will be avoided Outcome: Progressing   Problem: Elimination: Goal: Will not experience complications related to bowel motility Outcome: Progressing

## 2024-03-23 NOTE — Plan of Care (Signed)
  Problem: Coping: Goal: Ability to adjust to condition or change in health will improve Outcome: Progressing   Problem: Fluid Volume: Goal: Ability to maintain a balanced intake and output will improve Outcome: Progressing   Problem: Metabolic: Goal: Ability to maintain appropriate glucose levels will improve Outcome: Progressing   Problem: Tissue Perfusion: Goal: Adequacy of tissue perfusion will improve Outcome: Progressing   Problem: Elimination: Goal: Will not experience complications related to bowel motility Outcome: Progressing Goal: Will not experience complications related to urinary retention Outcome: Progressing   Problem: Pain Managment: Goal: General experience of comfort will improve and/or be controlled Outcome: Progressing   Problem: Education: Goal: Ability to demonstrate management of disease process will improve Outcome: Progressing   Problem: Activity: Goal: Capacity to carry out activities will improve Outcome: Progressing   Problem: Education: Goal: Understanding of CV disease, CV risk reduction, and recovery process will improve Outcome: Progressing   Problem: Cardiovascular: Goal: Ability to achieve and maintain adequate cardiovascular perfusion will improve Outcome: Progressing Goal: Vascular access site(s) Level 0-1 will be maintained Outcome: Progressing   Problem: Coping: Goal: Ability to adjust to condition or change in health will improve Outcome: Progressing   Problem: Fluid Volume: Goal: Ability to maintain a balanced intake and output will improve Outcome: Progressing   Problem: Metabolic: Goal: Ability to maintain appropriate glucose levels will improve Outcome: Progressing   Problem: Tissue Perfusion: Goal: Adequacy of tissue perfusion will improve Outcome: Progressing   Problem: Elimination: Goal: Will not experience complications related to bowel motility Outcome: Progressing Goal: Will not experience complications  related to urinary retention Outcome: Progressing   Problem: Pain Managment: Goal: General experience of comfort will improve and/or be controlled Outcome: Progressing   Problem: Education: Goal: Ability to demonstrate management of disease process will improve Outcome: Progressing   Problem: Activity: Goal: Capacity to carry out activities will improve Outcome: Progressing   Problem: Education: Goal: Understanding of CV disease, CV risk reduction, and recovery process will improve Outcome: Progressing   Problem: Cardiovascular: Goal: Ability to achieve and maintain adequate cardiovascular perfusion will improve Outcome: Progressing Goal: Vascular access site(s) Level 0-1 will be maintained Outcome: Progressing

## 2024-03-23 NOTE — Progress Notes (Addendum)
 [facility] [place], [place] 03/23/24  Subjective:   Hospital day # 4   Patient known to our practice from outpatient follow-up of CKD.  She presented to the emergency room with worsening shortness of breath with exertion.  Normally she is able to drive and carry on activities of daily living with ease.  For the last 2 to 3 weeks and especially last 4 days she has been getting short of breath and having to rest even to go from 1 room to the next.  She has developed worsening lower extremity edema.  She is now presented to the hospital for further management.   Patient seen resting in bed in ICU Alert Nursing student at bedside placing TED hose Pressors-Levo Milrinone in place  Renal: 06/02 0701 - 06/03 0700 In: 353.6 [I.V.:349; IV Piggyback:4.6] Out: 2250 [Urine:2250] Lab Results  Component Value Date   CREATININE 2.32 (H) 03/23/2024   CREATININE 2.43 (H) 03/22/2024   CREATININE 2.41 (H) 03/22/2024     Objective:  Vital signs in last 24 hours:  Temp:  [97.5 F (36.4 C)-98.4 F (36.9 C)] 98.1 F (36.7 C) (06/03 0400) Pulse Rate:  [61-95] 86 (06/03 0700) Resp:  [11-30] 26 (06/03 0700) BP: (68-119)/(39-86) 90/75 (06/03 0700) SpO2:  [94 %-100 %] 96 % (06/03 0700) Weight:  [69.9 kg-70.3 kg] 69.9 kg (06/03 0425)  Weight change: 0 kg Filed Weights   03/22/24 1010 03/22/24 1300 03/23/24 0425  Weight: 68.1 kg 70.3 kg 69.9 kg    Intake/Output:    Intake/Output Summary (Last 24 hours) at 03/23/2024 1016 Last data filed at 03/23/2024 0900 Gross per 24 hour  Intake 353.62 ml  Output 2925 ml  Net -2571.38 ml     Physical Exam: General: No acute distress  HEENT Moist mucous membranes  Pulm/lungs Normal breathing effort on room air  CVS/Heart Irregular rhythm  Abdomen:  Mildly distended  Extremities: 2+ pitting edema and lymphedema  Neurologic: Alert and oriented  Skin: No acute rashes          Basic Metabolic Panel:  Recent Labs  Lab  03/19/24 1038 03/19/24 2009 03/20/24 2117 03/21/24 0954 03/22/24 0321 03/22/24 0910 03/22/24 1118 03/23/24 0355  NA 127*   < > 129* 128* 124* 126* 126*  127* 121*  K 4.4   < > 3.4* 3.2* 2.7* 4.2 3.8  3.8 2.9*  CL 90*   < > 89* 88* 85* 87*  --  82*  CO2 23   < > 24 23 24 26   --  24  GLUCOSE 159*   < > 128* 124* 158* 140*  --  375*  BUN 76*   < > 83* 78* 80* 76*  --  67*  CREATININE 2.81*   < > 2.58* 2.50* 2.41* 2.43*  --  2.32*  CALCIUM  9.5   < > 9.5 9.4 9.6 9.7  --  8.8*  MG 2.3  --   --   --  2.8*  --   --  2.3  PHOS 4.7*  --   --   --   --   --   --   --    < > = values in this interval not displayed.     CBC: Recent Labs  Lab 03/19/24 1038 03/20/24 0420 03/21/24 0954 03/22/24 0321 03/22/24 1118 03/23/24 0355  WBC 6.1 5.5 5.3 5.7  --  7.0  HGB 12.9 12.0 12.6 12.3 14.6  14.6 11.5*  HCT 42.0 38.3 39.8 39.6 43.0  43.0 37.1  MCV 70.5* 68.8* 69.0* 68.9*  --  69.5*  PLT 313 281 298 269  --  312      Lab Results  Component Value Date   HEPBSAG NON REACTIVE 03/21/2024   HEPBSAB NON REACTIVE 04/11/2021   HEPBIGM NON REACTIVE 03/21/2024      Microbiology:  Recent Results (from the past 240 hours)  MRSA Next Gen by PCR, Nasal     Status: None   Collection Time: 03/22/24 12:53 PM   Specimen: Nasal Mucosa; Nasal Swab  Result Value Ref Range Status   MRSA by PCR Next Gen NOT DETECTED NOT DETECTED Final    Comment: (NOTE) The GeneXpert MRSA Assay (FDA approved for NASAL specimens only), is one component of a comprehensive MRSA colonization surveillance program. It is not intended to diagnose MRSA infection nor to guide or monitor treatment for MRSA infections. Test performance is not FDA approved in patients less than 53 years old. Performed at [facility], [address] [place], [place] [address]     Coagulation Studies: No results for input(s): "LABPROT", "INR" in the last 72 hours.  Urinalysis: No results for input(s): "COLORURINE",  "LABSPEC", "PHURINE", "GLUCOSEU", "HGBUR", "BILIRUBINUR", "KETONESUR", "PROTEINUR", "UROBILINOGEN", "NITRITE", "LEUKOCYTESUR" in the last 72 hours.  Invalid input(s): "APPERANCEUR"    Imaging: DG Chest Port 1 View Result Date: 03/22/2024 CLINICAL DATA:  PICC line placement EXAM: PORTABLE CHEST 1 VIEW COMPARISON:  03/19/2024 FINDINGS: A left PICC catheter has been placed with tip projecting over the cavoatrial junction region. No pneumothorax. Cardiac enlargement. Lungs are clear. No pleural effusion. Mediastinal contours appear intact. IMPRESSION: Left PICC catheter with tip projecting over the cavoatrial junction region. No pneumothorax. Cardiac enlargement. No evidence of active pulmonary disease. Electronically Signed   By: Boyce Byes M.D.   On: 03/22/2024 18:05   US  EKG SITE RITE Result Date: 03/22/2024 If Site Rite image not attached, placement could not be confirmed due to current cardiac rhythm.  CARDIAC CATHETERIZATION Result Date: 03/22/2024 Successful right heart catheterization via the left basilic vein. Right heart catheterization showed severely elevated right atrial pressure, mild pulmonary hypertension, moderately elevated wedge pressure and severely reduced cardiac output. RA: 18 mmHg RV: 38/5 with an EDP of 22 mmHg PW: 26 mmHg PA: 38/22 with a mean of 30 mmHg PA sat was 55%. Cardiac output was 3.06 with an index of 1.85. Recommendations: The patient has evidence of biventricular failure with prominent RV dysfunction.  She likely has underlying cardiorenal syndrome and not able to diurese with some form of inotropic therapy.  Recommend starting small milrinone drip and IV diuresis.  I discontinued Toprol  and midodrine .  Will consult advanced heart failure to help with management.     Medications:    milrinone 0.25 mcg/kg/min (03/23/24 0650)   norepinephrine (LEVOPHED) Adult infusion 13 mcg/min (03/23/24 0700)   potassium chloride  10 mEq (03/23/24 0927)    allopurinol   100 mg  Oral Daily   apixaban   5 mg Oral BID   Chlorhexidine  Gluconate Cloth  6 each Topical Daily   docusate sodium   100 mg Oral BID   folic acid   1 mg Oral Daily   furosemide   80 mg Intravenous BID   insulin  aspart  0-5 Units Subcutaneous QHS   insulin  aspart  0-9 Units Subcutaneous TID WC   insulin  glargine-yfgn  10 Units Subcutaneous BID   latanoprost   1 drop Both Eyes QHS   pantoprazole   40 mg Oral BID   polyethylene glycol  17  g Oral Daily   potassium chloride   40 mEq Oral Q4H   [START ON 03/26/2024] rosuvastatin   5 mg Oral Weekly   senna  1 tablet Oral Daily   sodium chloride  flush  10-40 mL Intracatheter Q12H   tolvaptan  15 mg Oral Once   acetaminophen , albuterol , bisacodyl , dextromethorphan-guaiFENesin , ondansetron  (ZOFRAN ) IV, mouth rinse, oxyCODONE, sodium chloride  flush, traMADol , traZODone  Assessment/ Plan:  78 y.o. female with  multiple medical problems including chronic kidney disease, hypertension, atrial fibrillation, hyperlipidemia, chronic lower extremity edema, diastolic dysfunction, diabetes, osteoarthritis of the knee, chronic back pain  admitted on 03/19/2024 for Shortness of breath [R06.02] Acute on chronic diastolic heart failure (HCC) [I50.33] Acute on chronic diastolic CHF (congestive heart failure) (HCC) [I50.33]  1.  Acute on chronic diastolic CHF, with worsening lower extremity edema and shortness of breath 2.  Acute kidney injury on chronic kidney disease stage IV.  Baseline creatinine 2.6/GFR 19 from April 2025. Creatinine has worsened to 2.65, likely secondary to aggressive diuresis. 3.  Hypokalemia 4.  Hyponatremia. 5.  Diabetes type 2 with CKD.  Hemoglobin A1c 7.4% from February 2025.  2D echo 03/20/2024-LVEF 60 to 65%, moderate concentric LVH, normal right ventricular systolic function small pericardial effusion, normal aortic valve, severe mitral annular calcification  Plan: RHC on 6/2 showed severely decreased cardiac output, biventricular failure with  RV dysfunction. Milrinone drip with IV Furosemide . Metolazone  given yesterday.  Urine output in past 24 hours. Potassium decreased, 2.9. Primary team has ordered IV supplementation. Sodium decreased to 121, cardiology has ordered Tolvaptan. May have to hold afternoon dose of Furosemide .  Will order Ensure supplementation twice daily SSI managed by primary team   LOS: 4 Anola King 6/3/202510:16 AM  [name] [place], [place] [phone]

## 2024-03-24 DIAGNOSIS — I5033 Acute on chronic diastolic (congestive) heart failure: Secondary | ICD-10-CM | POA: Diagnosis not present

## 2024-03-24 DIAGNOSIS — R570 Cardiogenic shock: Secondary | ICD-10-CM | POA: Diagnosis not present

## 2024-03-24 DIAGNOSIS — I050 Rheumatic mitral stenosis: Secondary | ICD-10-CM

## 2024-03-24 DIAGNOSIS — I4821 Permanent atrial fibrillation: Secondary | ICD-10-CM

## 2024-03-24 DIAGNOSIS — I060 Rheumatic aortic stenosis: Secondary | ICD-10-CM

## 2024-03-24 DIAGNOSIS — I3139 Other pericardial effusion (noninflammatory): Secondary | ICD-10-CM | POA: Diagnosis not present

## 2024-03-24 LAB — GLUCOSE, CAPILLARY
Glucose-Capillary: 175 mg/dL — ABNORMAL HIGH (ref 70–99)
Glucose-Capillary: 242 mg/dL — ABNORMAL HIGH (ref 70–99)
Glucose-Capillary: 213 mg/dL — ABNORMAL HIGH (ref 70–99)
Glucose-Capillary: 242 mg/dL — ABNORMAL HIGH (ref 70–99)

## 2024-03-24 LAB — CBC
HCT: 38.6 % (ref 36.0–46.0)
Hemoglobin: 12 g/dL (ref 12.0–15.0)
MCH: 21.5 pg — ABNORMAL LOW (ref 26.0–34.0)
MCHC: 31.1 g/dL (ref 30.0–36.0)
MCV: 69.2 fL — ABNORMAL LOW (ref 80.0–100.0)
Platelets: 283 10*3/uL (ref 150–400)
RBC: 5.58 MIL/uL — ABNORMAL HIGH (ref 3.87–5.11)
RDW: 20 % — ABNORMAL HIGH (ref 11.5–15.5)
WBC: 8.4 10*3/uL (ref 4.0–10.5)
nRBC: 0 % (ref 0.0–0.2)

## 2024-03-24 LAB — HEMOGLOBIN A1C
Hgb A1c MFr Bld: 7.6 % — ABNORMAL HIGH (ref 4.8–5.6)
Mean Plasma Glucose: 171.42 mg/dL

## 2024-03-24 LAB — BASIC METABOLIC PANEL WITH GFR
Anion gap: 15 (ref 5–15)
BUN: 70 mg/dL — ABNORMAL HIGH (ref 8–23)
CO2: 27 mmol/L (ref 22–32)
Calcium: 9.2 mg/dL (ref 8.9–10.3)
Chloride: 85 mmol/L — ABNORMAL LOW (ref 98–111)
Creatinine, Ser: 2.15 mg/dL — ABNORMAL HIGH (ref 0.44–1.00)
GFR, Estimated: 23 mL/min — ABNORMAL LOW (ref >60)
Glucose, Bld: 168 mg/dL — ABNORMAL HIGH (ref 70–99)
Potassium: 3.4 mmol/L — ABNORMAL LOW (ref 3.5–5.1)
Sodium: 127 mmol/L — ABNORMAL LOW (ref 135–145)

## 2024-03-24 LAB — COOXEMETRY PANEL
Carboxyhemoglobin: 1.5 % (ref 0.5–1.5)
Methemoglobin: 1 % (ref 0.0–1.5)
O2 Saturation: 66.4 %
Total hemoglobin: 12.6 g/dL (ref 12.0–16.0)
Total oxygen content: 64.8 %

## 2024-03-24 LAB — MAGNESIUM: Magnesium: 2.4 mg/dL (ref 1.7–2.4)

## 2024-03-24 LAB — PHOSPHORUS: Phosphorus: 3.3 mg/dL (ref 2.5–4.6)

## 2024-03-24 MED ORDER — MIDODRINE HCL 5 MG PO TABS
10.0000 mg | ORAL_TABLET | Freq: Three times a day (TID) | ORAL | Status: DC
  Administered 2024-03-24 – 2024-03-30 (×18): 10 mg via ORAL
  Filled 2024-03-24 (×18): qty 2

## 2024-03-24 MED ORDER — INSULIN ASPART 100 UNIT/ML IJ SOLN
0.0000 [IU] | Freq: Three times a day (TID) | INTRAMUSCULAR | Status: DC
  Administered 2024-03-25: 8 [IU] via SUBCUTANEOUS
  Administered 2024-03-25: 5 [IU] via SUBCUTANEOUS
  Administered 2024-03-25 – 2024-03-26 (×3): 3 [IU] via SUBCUTANEOUS
  Administered 2024-03-26: 5 [IU] via SUBCUTANEOUS
  Administered 2024-03-27 – 2024-03-28 (×3): 2 [IU] via SUBCUTANEOUS
  Administered 2024-03-28 – 2024-03-29 (×2): 3 [IU] via SUBCUTANEOUS
  Administered 2024-03-29: 2 [IU] via SUBCUTANEOUS
  Administered 2024-03-30 (×3): 3 [IU] via SUBCUTANEOUS
  Administered 2024-04-01: 2 [IU] via SUBCUTANEOUS
  Administered 2024-04-01: 3 [IU] via SUBCUTANEOUS
  Administered 2024-04-02: 5 [IU] via SUBCUTANEOUS
  Administered 2024-04-02: 3 [IU] via SUBCUTANEOUS
  Filled 2024-03-24 (×23): qty 1

## 2024-03-24 MED ORDER — POTASSIUM CHLORIDE CRYS ER 20 MEQ PO TBCR
40.0000 meq | EXTENDED_RELEASE_TABLET | ORAL | Status: AC
  Administered 2024-03-24 (×3): 40 meq via ORAL
  Filled 2024-03-24 (×3): qty 2

## 2024-03-24 NOTE — Plan of Care (Signed)
  Problem: Education: Goal: Ability to describe self-care measures that may prevent or decrease complications (Diabetes Survival Skills Education) will improve Outcome: Progressing   Problem: Coping: Goal: Ability to adjust to condition or change in health will improve Outcome: Progressing   Problem: Fluid Volume: Goal: Ability to maintain a balanced intake and output will improve Outcome: Progressing   Problem: Health Behavior/Discharge Planning: Goal: Ability to identify and utilize available resources and services will improve Outcome: Progressing Goal: Ability to manage health-related needs will improve Outcome: Progressing   Problem: Metabolic: Goal: Ability to maintain appropriate glucose levels will improve Outcome: Progressing   Problem: Nutritional: Goal: Maintenance of adequate nutrition will improve Outcome: Progressing Goal: Progress toward achieving an optimal weight will improve Outcome: Progressing   Problem: Skin Integrity: Goal: Risk for impaired skin integrity will decrease Outcome: Progressing   Problem: Tissue Perfusion: Goal: Adequacy of tissue perfusion will improve Outcome: Progressing   Problem: Health Behavior/Discharge Planning: Goal: Ability to manage health-related needs will improve Outcome: Progressing   Problem: Clinical Measurements: Goal: Ability to maintain clinical measurements within normal limits will improve Outcome: Progressing Goal: Respiratory complications will improve Outcome: Progressing Goal: Cardiovascular complication will be avoided Outcome: Progressing

## 2024-03-24 NOTE — Progress Notes (Signed)
 NAME:  JONITA HIROTA, MRN:  [id number], DOB:  1945-12-28, LOS: 5 ADMISSION DATE:  03/19/2024, CONSULTATION DATE: 03/22/2024 REFERRING MD: Dr. Alvenia Aus, CHIEF COMPLAINT: Shortness of Breath   Brief Pt Description / Synopsis:  78 y.o. female admitted with Acute Decompensated Biventricular Heart Failure/HFpEF, and moderate to severe Aortic Stenosis complicated by Cardiogenic Shock and Cardiorenal Syndrome requiring inotrope's and vasopressors.  History of Present Illness:  This is a 78 yo female who presented to [facility] ER on 05/30 from home with abdominal distention, leg swelling, and shortness of breath with exertion onset of symptoms several weeks ago.  Pt reported she had been unable to tolerate laying flat at night due to shortness of breath.  At baseline she uses a walker to ambulate, but has only been able to take a few steps due to symptoms outlined above.  She also endorsed a 15 lb weight gain from her baseline over a 3 week period.  She has been compliant with her outpatient torsemide  and metolazone , and she recently has taken a double dose of her torsemide  recently.  Due to worsening symptoms pt proceeded to the ER for evaluation.  All hx obtained from EDP note.  ED Course Upon arrival to the ER significant lab results were: Na+ 127/glucose 159/BUN 76/creatinine 2.81/phosphorous 4.7/total bilirubin 2.1/BNP 265.4/troponin 26.  CT Chest revealed borderline cardiomegaly with mild diffuse pericardial thickening vs. small volume fluid; esophagitis vs. reflux; and suspected liver cirrhosis.  CXR negative for acute cardiopulmonary disease.  She received 40 mg iv lasix .  She was subsequently admitted to the telemetry unit for additional workup and treatment by hospitalist team.  See detailed hospital course below under significant events.    Pertinent  Medical History  Permanent Atrial Fibrillation  HFpEF CKD stage III Type I Diabetes Mellitus  HLD HTN  Lymphedema  Morbid Obesity   Osteopenia Pericardial Effusion   Significant Hospital Events: Including procedures, antibiotic start and stop dates in addition to other pertinent events   05/30: Pt admitted to the telemetry unit with acute on chronic respiratory failure secondary to acute on chronic diastolic CHF  05/31: No significant ascites.  Lobular and heterogeneous appearance of the liver consistent        with history of cirrhosis.  Due to worsening renal failure lasix  held  05/31: Echo revealed 60 to 65%; echogenic material in the pericardium lateral to the RV apex that has been visualized on prior echo may represent fat pad vs. prior hemopericardium; small pericardial effusion without tamponade; mild mitral valve regurgitation; mild mitral stenosis; severe thickening of aortic valve  06/02: Right heart catheterization revealed severely elevated right atrial pressure, mild pulmonary hypertension, moderately elevated wedge pressure, and severely reduced cardiac output.  Pt transferred to ICU to start milrinone gtt, iv diuresis, and levophed gtt.  PCCM team consulted to assist with management.  06/03: Pt remains on levophed and milrinone gtts overnight UOP 1.3L  06/04: No significant events noted overnight.  Afebrile, remains on Milrinone (0.25) and Levophed (titrated down to 6 mcg), Coox is 66.4.  Creatinine slightly improved to 2.1 from 2.3, UOP 3.5 L last 24 hrs (net - 4.2 L) with aggressive diuresis.  Reports she was able to sleep with Tramadol  last night  Interim History / Subjective:  As outlined above under Significant Hospital Events section  Objective    Blood pressure 94/63, pulse 100, temperature 97.9 F (36.6 C), temperature source Oral, resp. rate 20, height 5' (1.524 m), weight 70.2 kg, SpO2 93%. CVP:  [  9 mmHg-18 mmHg] 12 mmHg      Intake/Output Summary (Last 24 hours) at 03/24/2024 0839 Last data filed at 03/24/2024 0700 Gross per 24 hour  Intake 363.62 ml  Output 3250 ml  Net -2886.38 ml   Filed  Weights   03/22/24 1300 03/23/24 0425 03/24/24 0720  Weight: 70.3 kg 69.9 kg 70.2 kg    Examination: General: Chronically-ill appearing female, NAD on RA  HENT: Supple, no JVD  Lungs: Clear throughout, even, non labored  Cardiovascular: Irregular irregular, no r/g, 2+ radial/1+ distal pulses, 2+ bilateral lower extremity pitting edema  Abdomen: +BS x4, obese, soft, non tender, non distended  Extremities: Moves all extremities  Skin: No rashes or lesions present  Neuro: Alert and oriented, following commands, PERRLA  GU: Indwelling foley catheter draining yellow urine   Resolved problem list   Assessment and Plan   #Acute pain  #Insomnia  - Prn tylenol , tramadol , or oxycodone for pain management  - Prn Kpad for back pain  - Prn trazodone for insomnia  - Maintain sleep/wake cycle   #Acute Decompensated Biventricular Heart Failure; HFpEF; Predominately RV Failure #Cardiogenic Shock #Moderate to Severe Aortic Stenosis #Moderate Mitral Regurgitation #Mild pulmonary hypertension  #Permanent atrial fibrillation  #Small pericardial effusion  Hx: Pericardial effusion, HLD, and HTN  RHC 03/22/2024: showed severely decreased cardiac output, biventricular failure with RV dysfunction.  -Continuous cardiac monitoring -Maintain MAP >65 -Vasopressors as needed to maintain MAP goal -Advanced Heart Failure is following, appreciate input ~ continue Milrinone as per HF -Follow Coox & CVP -Trend lactic acid until normalized -Trend HS Troponin until peaked -Diuresis as BP and renal function permits per HF team ~ currently on IV Lasix  80 mg BID -Continue Eliquis  for [facility]  #Hyponatremia 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 in setting of Cardiorenal Syndrome #Hypokalemia  -Monitor I&O's / urinary output -Follow BMP -Ensure adequate renal perfusion -Avoid nephrotoxic agents as able -Replace electrolytes as indicated ~ Pharmacy following for assistance with electrolyte  replacement -Nephrology following, appreciate input  #Acute respiratory failure secondary to CHF exacerbation  -Supplemental O2 as needed to maintain O2 sats >92% -BiPAP if needed, wean as tolerated ~ not requiring -Follow intermittent Chest X-ray & ABG as needed -Bronchodilators prn -Diuresis as BP and renal function permits ~ currently on IV Lasix  80 mg BID -Pulmonary toilet as able  #H/o Cirrhosis  - Trend hepatic function panel  - Avoid hepatotoxic agents as able   #Type II diabetes mellitus  - CBG's ac/hs  - SSI and scheduled semglee   - Follow hypo/hyperglycemic protocol  - Target range 140 to 180    Best Practice (right click and "Reselect all SmartList Selections" daily)   Diet/type: Regular consistency (see orders) DVT prophylaxis Apixaban   Pressure ulcer(s): N/A GI prophylaxis: PPI Lines: Left upper arm PICC line and still needed  Foley: Yes and still needed  Code Status:  full code Last date of multidisciplinary goals of care discussion [03/24/2024]  06/04: Updated pt regarding pts condition and current plan of care.  All questions answered   Labs   CBC: Recent Labs  Lab 03/20/24 0420 03/21/24 0954 03/22/24 0321 03/22/24 1118 03/23/24 0355 03/24/24 0427  WBC 5.5 5.3 5.7  --  7.0 8.4  HGB 12.0 12.6 12.3 14.6  14.6 11.5* 12.0  HCT 38.3 39.8 39.6 43.0  43.0 37.1 38.6  MCV 68.8* 69.0* 68.9*  --  69.5* 69.2*  PLT 281 298 269  --  312 283    Basic Metabolic  Panel: Recent Labs  Lab 03/19/24 1038 03/19/24 2009 03/22/24 0321 03/22/24 0910 03/22/24 1118 03/23/24 0355 03/23/24 1446 03/24/24 0427  NA 127*   < > 124* 126* 126*  127* 121* 125* 127*  K 4.4   < > 2.7* 4.2 3.8  3.8 2.9* 4.1 3.4*  CL 90*   < > 85* 87*  --  82* 84* 85*  CO2 23   < > 24 26  --  24 25 27   GLUCOSE 159*   < > 158* 140*  --  375* 208* 168*  BUN 76*   < > 80* 76*  --  67* 69* 70*  CREATININE 2.81*   < > 2.41* 2.43*  --  2.32* 2.37* 2.15*  CALCIUM  9.5   < > 9.6 9.7  --  8.8*  9.4 9.2  MG 2.3  --  2.8*  --   --  2.3  --  2.4  PHOS 4.7*  --   --   --   --   --   --  3.3   < > = values in this interval not displayed.   GFR: Estimated Creatinine Clearance: 19.2 mL/min (A) (by C-G formula based on SCr of 2.15 mg/dL (H)). Recent Labs  Lab 03/21/24 0954 03/22/24 0321 03/23/24 0355 03/24/24 0427  WBC 5.3 5.7 7.0 8.4    Liver Function Tests: Recent Labs  Lab 03/19/24 1038 03/23/24 1446  AST 19 22  ALT 16 12  ALKPHOS 88 80  BILITOT 2.1* 3.0*  PROT 6.3* 6.4*  ALBUMIN  3.8 3.8   No results for input(s): "LIPASE", "AMYLASE" in the last 168 hours. No results for input(s): "AMMONIA" in the last 168 hours.  ABG    Component Value Date/Time   HCO3 26.9 03/22/2024 1118   HCO3 26.2 03/22/2024 1118   TCO2 28 03/22/2024 1118   TCO2 27 03/22/2024 1118   ACIDBASEDEF 8.7 (H) 04/11/2021 0210   O2SAT 66.4 03/24/2024 0427     Coagulation Profile: No results for input(s): "INR", "PROTIME" in the last 168 hours.  Cardiac Enzymes: No results for input(s): "CKTOTAL", "CKMB", "CKMBINDEX", "TROPONINI" in the last 168 hours.  HbA1C: Hemoglobin A1C  Date/Time Value Ref Range Status  06/28/2016 12:00 AM 6.6  Final   HB A1C (BAYER DCA - WAIVED)  Date/Time Value Ref Range Status  12/19/2023 04:24 PM 7.4 (H) 4.8 - 5.6 % Final    Comment:             Prediabetes: 5.7 - 6.4          Diabetes: >6.4          Glycemic control for adults with diabetes: <7.0   09/02/2023 01:35 PM 6.3 (H) 4.8 - 5.6 % Final    Comment:             Prediabetes: 5.7 - 6.4          Diabetes: >6.4          Glycemic control for adults with diabetes: <7.0     CBG: Recent Labs  Lab 03/23/24 1132 03/23/24 1620 03/23/24 1931 03/23/24 2126 03/24/24 0716  GLUCAP 282* 212* 178* 200* 175*    Review of Systems: Positives in BOLD   Gen: back pain, fever, chills, weight change, fatigue, night sweats HEENT: Denies blurred vision, double vision, hearing loss, tinnitus, sinus congestion,  rhinorrhea, sore throat, neck stiffness, dysphagia PULM: shortness of breath, cough, sputum production, hemoptysis, wheezing CV: chest pain, edema, orthopnea, paroxysmal nocturnal dyspnea, palpitations  GI: Denies abdominal pain, nausea, vomiting, diarrhea, hematochezia, melena, constipation, change in bowel habits GU: Denies dysuria, hematuria, polyuria, oliguria, urethral discharge Endocrine: Denies hot or cold intolerance, polyuria, polyphagia or appetite change Derm: Denies rash, dry skin, scaling or peeling skin change Heme: Denies easy bruising, bleeding, bleeding gums Neuro: Denies headache, numbness, weakness, slurred speech, loss of memory or consciousness   Past Medical History:  She,  has a past medical history of A-fib (HCC), CHF (congestive heart failure) (HCC), Chronic heart failure with preserved ejection fraction (HFpEF) (HCC), 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   Surgical History:   Past Surgical History:  Procedure Laterality Date   BASAL CELL CARCINOMA EXCISION  11/01/2022   CATARACT EXTRACTION W/PHACO Left 10/09/2021   Procedure: CATARACT EXTRACTION PHACO AND INTRAOCULAR LENS PLACEMENT (IOC) LEFT DIABETIC 4.84 00:37.8;  Surgeon: Clair Crews, MD;  Location: [facility];  Service: Ophthalmology;  Laterality: Left;  Diabetic   CATARACT EXTRACTION W/PHACO Right 10/30/2021   Procedure: CATARACT EXTRACTION PHACO AND INTRAOCULAR LENS PLACEMENT (IOC) RIGHT DIABETIC 5.65 00:38.5;  Surgeon: Clair Crews, MD;  Location: [facility];  Service: Ophthalmology;  Laterality: Right;  Diabetic    CHOLECYSTECTOMY     DILATION AND CURETTAGE OF UTERUS     IR FLUORO GUIDE CV LINE RIGHT  08/16/2021   IR THORACENTESIS ASP PLEURAL SPACE W/IMG GUIDE  08/16/2021   PERICARDIOCENTESIS N/A 08/05/2021   Procedure: PERICARDIOCENTESIS;  Surgeon: Percival Brace, MD;  Location: [facility];  Service: Cardiovascular;  Laterality: N/A;   RIGHT HEART CATH N/A 03/22/2024   Procedure: RIGHT HEART CATH;  Surgeon: Wenona Hamilton, MD;  Location: [facility];  Service: Cardiovascular;  Laterality: N/A;   TEAR DUCT PROBING WITH STRABISMUS REPAIR Right    TONSILLECTOMY       Social History:   reports that she has never smoked. She has never used smokeless tobacco. She reports that she does not currently use alcohol. She reports that she does not use drugs.   Family History:  Her family history includes Breast cancer in her maternal grandmother and mother; Cancer in her brother and brother; Diabetes in her brother; Heart disease in her brother and mother; Heart disease (age of onset: 71) in her father; Osteoporosis in her mother; Parkinson's disease in her brother.   Allergies No Known Allergies   Home Medications  Prior to Admission medications   Medication Sig Start Date End Date Taking? Authorizing Provider  allopurinol  (ZYLOPRIM ) 100 MG tablet Take 1 tablet (100 mg total) by mouth daily. 12/19/23  Yes Johnson, [name], DO  apixaban  (ELIQUIS ) 5 MG TABS tablet Take 1 tablet (5 mg total) by mouth 2 (two) times daily. 12/19/23  Yes Johnson, [name], DO  empagliflozin  (JARDIANCE ) 25 MG TABS tablet TAKE 1 TABLET BY MOUTH ONCE DAILY BEFOREBREAKFAST 12/19/23  Yes Johnson, [name], DO  folic acid  (FOLVITE ) 1 MG tablet Take 1 tablet (1 mg total) by mouth daily. 09/30/23  Yes Johnson, [name], DO  insulin  glargine, 2 Unit Dial, (TOUJEO  MAX SOLOSTAR) 300 UNIT/ML Solostar Pen Inject 16-20 Units into the skin daily. 12/19/23 09/14/24 Yes Johnson, [name], DO  latanoprost  (XALATAN ) 0.005 %  ophthalmic solution Place 1 drop into both eyes at bedtime.  Yes [provider]  metolazone  (ZAROXOLYN ) 2.5 MG tablet Take 2.5 mg by mouth once a week. Wednesday   Yes [provider]  metoprolol  succinate (TOPROL -XL) 25 MG 24 hr tablet Take 0.5 tablets (12.5 mg total) by mouth daily. 12/19/23  Yes Johnson, [name], DO  midodrine  (PROAMATINE ) 10 MG tablet TAKE 1 TABLET BY MOUTH 3 TIMES DAILY WITH MEALS 02/24/23  Yes Gollan, Timothy J, MD  pantoprazole  (PROTONIX ) 40 MG tablet Take 1 tablet (40 mg total) by mouth 2 (two) times daily. 12/19/23  Yes Johnson, [name], DO  potassium chloride  SA (KLOR-CON  M) 20 MEQ tablet Take 2 tablets (40 mEq total) by mouth 2 (two) times daily. 12/19/23  Yes Johnson, [name], DO  rosuvastatin  (CRESTOR ) 5 MG tablet Take 1 tablet (5 mg total) by mouth once a week. 05/30/23  Yes Johnson, [name], DO  Semaglutide  (RYBELSUS ) 7 MG TABS Take 1 tablet (7 mg total) by mouth daily. 12/19/23  Yes Johnson, [name], DO  spironolactone  (ALDACTONE ) 50 MG tablet Take 1 tablet (50 mg total) by mouth daily. 12/19/23  Yes Johnson, [name], DO  torsemide  (DEMADEX ) 20 MG tablet TAKE 1 TO 2 TABLETS BY MOUTH DAILY. ONLY TAKE 2 TABLETS FOR 1 TO 3 DAYS AND IF PERSISTS, CALL WITH BAD SWELLING 12/19/23  Yes Johnson, [name], DO  Continuous Glucose Sensor (DEXCOM G7 SENSOR) MISC by Does not apply route.    [provider]  Glucagon  (GVOKE HYPOPEN  2-PACK) 0.5 MG/0.1ML SOAJ Inject 1 each into the skin daily as needed. Patient not taking: Reported on 03/19/2024 03/14/23   Terre Ferri P, DO  Insulin  Pen Needle (PEN NEEDLES) 32G X 4 MM MISC 1 each by Does not apply route 3 (three) times daily. Patient not taking: Reported on 02/24/2024 01/30/23   Terre Ferri P, DO  Insulin  Pen Needle (ULTRACARE PEN NEEDLES) 32G X 4 MM MISC USE 1 PEN ONCE DAILY Patient not taking: Reported on 02/24/2024 10/11/22   Terre Ferri P, DO  Insulin  Syringe-Needle U-100 (INSULIN  SYRINGE 1CC/31GX5/16") 31G X  5/16" 1 ML MISC 1 each by Does not apply route 3 (three) times daily. Patient not taking: Reported on 02/24/2024 12/13/22   Terre Ferri P, DO  Insulin  Syringe/Needle U-500 (BD INSULIN  SYRINGE U-500) 31G X 0.5 ML MISC 1 each by Does not apply route 3 (three) times daily as needed. Patient not taking: Reported on 02/24/2024 03/14/23   Terre Ferri P, DO  metroNIDAZOLE (METROGEL) 1 % gel Apply to face every morning. Patient not taking: Reported on 03/19/2024 09/19/22   [provider]  NEEDLE, DISP, 30 G (BD DISP NEEDLES) 30G X 1/2" MISC by Does not apply route. Patient not taking: Reported on 02/24/2024    [provider]     Critical care time: 40 minutes     Cherylann Corpus, AGACNP-BC [facility] Prefer epic messenger for cross cover needs If after hours, please call E-link

## 2024-03-24 NOTE — Progress Notes (Signed)
 [facility] [place], [place] 03/24/24  Subjective:   Hospital day # 5   Patient known to our practice from outpatient follow-up of CKD.  She presented to the emergency room with worsening shortness of breath with exertion.  Normally she is able to drive and carry on activities of daily living with ease.  For the last 2 to 3 weeks and especially last 4 days she has been getting short of breath and having to rest even to go from 1 room to the next.  She has developed worsening lower extremity edema.  She is now presented to the hospital for further management.   Patient sitting up in bed Alert Remains on Levo and Milrinone  Renal: 06/03 0701 - 06/04 0700 In: 363.6 [I.V.:363.6] Out: 3575 [Urine:3575] Lab Results  Component Value Date   CREATININE 2.15 (H) 03/24/2024   CREATININE 2.37 (H) 03/23/2024   CREATININE 2.32 (H) 03/23/2024     Objective:  Vital signs in last 24 hours:  Temp:  [97.9 F (36.6 C)-98.5 F (36.9 C)] 97.9 F (36.6 C) (06/04 0400) Pulse Rate:  [71-118] 91 (06/04 0930) Resp:  [12-31] 18 (06/04 0930) BP: (67-145)/(50-94) 95/58 (06/04 0930) SpO2:  [93 %-100 %] 95 % (06/04 0930) Weight:  [70.2 kg] 70.2 kg (06/04 0720)  Weight change:  Filed Weights   03/22/24 1300 03/23/24 0425 03/24/24 0720  Weight: 70.3 kg 69.9 kg 70.2 kg    Intake/Output:    Intake/Output Summary (Last 24 hours) at 03/24/2024 1013 Last data filed at 03/24/2024 0848 Gross per 24 hour  Intake 382.96 ml  Output 2650 ml  Net -2267.04 ml     Physical Exam: General: No acute distress  HEENT Moist mucous membranes  Pulm/lungs Normal breathing effort on room air  CVS/Heart Irregular rhythm  Abdomen:  Mildly distended  Extremities: 2+ pitting edema and lymphedema  Neurologic: Alert and oriented  Skin: No acute rashes          Basic Metabolic Panel:  Recent Labs  Lab 03/19/24 1038 03/19/24 2009 03/22/24 0321 03/22/24 0910 03/22/24 1118 03/23/24 0355  03/23/24 1446 03/24/24 0427  NA 127*   < > 124* 126* 126*  127* 121* 125* 127*  K 4.4   < > 2.7* 4.2 3.8  3.8 2.9* 4.1 3.4*  CL 90*   < > 85* 87*  --  82* 84* 85*  CO2 23   < > 24 26  --  24 25 27   GLUCOSE 159*   < > 158* 140*  --  375* 208* 168*  BUN 76*   < > 80* 76*  --  67* 69* 70*  CREATININE 2.81*   < > 2.41* 2.43*  --  2.32* 2.37* 2.15*  CALCIUM  9.5   < > 9.6 9.7  --  8.8* 9.4 9.2  MG 2.3  --  2.8*  --   --  2.3  --  2.4  PHOS 4.7*  --   --   --   --   --   --  3.3   < > = values in this interval not displayed.     CBC: Recent Labs  Lab 03/20/24 0420 03/21/24 0954 03/22/24 0321 03/22/24 1118 03/23/24 0355 03/24/24 0427  WBC 5.5 5.3 5.7  --  7.0 8.4  HGB 12.0 12.6 12.3 14.6  14.6 11.5* 12.0  HCT 38.3 39.8 39.6 43.0  43.0 37.1 38.6  MCV 68.8* 69.0* 68.9*  --  69.5* 69.2*  PLT 281 298  269  --  312 283      Lab Results  Component Value Date   HEPBSAG NON REACTIVE 03/21/2024   HEPBSAB NON REACTIVE 04/11/2021   HEPBIGM NON REACTIVE 03/21/2024      Microbiology:  Recent Results (from the past 240 hours)  MRSA Next Gen by PCR, Nasal     Status: None   Collection Time: 03/22/24 12:53 PM   Specimen: Nasal Mucosa; Nasal Swab  Result Value Ref Range Status   MRSA by PCR Next Gen NOT DETECTED NOT DETECTED Final    Comment: (NOTE) The GeneXpert MRSA Assay (FDA approved for NASAL specimens only), is one component of a comprehensive MRSA colonization surveillance program. It is not intended to diagnose MRSA infection nor to guide or monitor treatment for MRSA infections. Test performance is not FDA approved in patients less than 78 years old. Performed at [facility], [address] [place], [place] [address]     Coagulation Studies: No results for input(s): "LABPROT", "INR" in the last 72 hours.  Urinalysis: No results for input(s): "COLORURINE", "LABSPEC", "PHURINE", "GLUCOSEU", "HGBUR", "BILIRUBINUR", "KETONESUR", "PROTEINUR",  "UROBILINOGEN", "NITRITE", "LEUKOCYTESUR" in the last 72 hours.  Invalid input(s): "APPERANCEUR"    Imaging: DG Chest Port 1 View Result Date: 03/22/2024 CLINICAL DATA:  PICC line placement EXAM: PORTABLE CHEST 1 VIEW COMPARISON:  03/19/2024 FINDINGS: A left PICC catheter has been placed with tip projecting over the cavoatrial junction region. No pneumothorax. Cardiac enlargement. Lungs are clear. No pleural effusion. Mediastinal contours appear intact. IMPRESSION: Left PICC catheter with tip projecting over the cavoatrial junction region. No pneumothorax. Cardiac enlargement. No evidence of active pulmonary disease. Electronically Signed   By: Boyce Byes M.D.   On: 03/22/2024 18:05   US  EKG SITE RITE Result Date: 03/22/2024 If Site Rite image not attached, placement could not be confirmed due to current cardiac rhythm.  CARDIAC CATHETERIZATION Result Date: 03/22/2024 Successful right heart catheterization via the left basilic vein. Right heart catheterization showed severely elevated right atrial pressure, mild pulmonary hypertension, moderately elevated wedge pressure and severely reduced cardiac output. RA: 18 mmHg RV: 38/5 with an EDP of 22 mmHg PW: 26 mmHg PA: 38/22 with a mean of 30 mmHg PA sat was 55%. Cardiac output was 3.06 with an index of 1.85. Recommendations: The patient has evidence of biventricular failure with prominent RV dysfunction.  She likely has underlying cardiorenal syndrome and not able to diurese with some form of inotropic therapy.  Recommend starting small milrinone drip and IV diuresis.  I discontinued Toprol  and midodrine .  Will consult advanced heart failure to help with management.     Medications:    milrinone 0.25 mcg/kg/min (03/24/24 0859)   norepinephrine (LEVOPHED) Adult infusion 7 mcg/min (03/24/24 0848)    allopurinol   100 mg Oral Daily   apixaban   5 mg Oral BID   Chlorhexidine  Gluconate Cloth  6 each Topical Daily   docusate sodium   100 mg Oral BID    feeding supplement  237 mL Oral BID BM   folic acid   1 mg Oral Daily   furosemide   80 mg Intravenous BID   insulin  aspart  0-5 Units Subcutaneous QHS   insulin  aspart  0-9 Units Subcutaneous TID WC   insulin  glargine-yfgn  10 Units Subcutaneous BID   latanoprost   1 drop Both Eyes QHS   pantoprazole   40 mg Oral BID   polyethylene glycol  17 g Oral Daily   potassium chloride   40 mEq Oral Q4H   [  START ON 03/26/2024] rosuvastatin   5 mg Oral Weekly   senna  1 tablet Oral Daily   sodium chloride  flush  10-40 mL Intracatheter Q12H   acetaminophen , albuterol , bisacodyl , dextromethorphan-guaiFENesin , ondansetron  (ZOFRAN ) IV, mouth rinse, oxyCODONE, sodium chloride  flush, traMADol , traZODone  Assessment/ Plan:  78 y.o. female with  multiple medical problems including chronic kidney disease, hypertension, atrial fibrillation, hyperlipidemia, chronic lower extremity edema, diastolic dysfunction, diabetes, osteoarthritis of the knee, chronic back pain  admitted on 03/19/2024 for Shortness of breath [R06.02] Acute on chronic diastolic heart failure (HCC) [I50.33] Acute on chronic diastolic CHF (congestive heart failure) (HCC) [I50.33]  1.  Acute on chronic diastolic CHF, with worsening lower extremity edema and shortness of breath 2.  Acute kidney injury on chronic kidney disease stage IV.  Baseline creatinine 2.6/GFR 19 from April 2025. Creatinine has worsened to 2.65, likely secondary to aggressive diuresis. 3.  Hypokalemia 4.  Hyponatremia. 5.  Diabetes type 2 with CKD.  Hemoglobin A1c 7.4% from February 2025.  2D echo 03/20/2024-LVEF 60 to 65%, moderate concentric LVH, normal right ventricular systolic function small pericardial effusion, normal aortic valve, severe mitral annular calcification  Plan: RHC on 6/2 showed severely decreased cardiac output, biventricular failure with RV dysfunction. Milrinone drip with IV Furosemide . Urine output Potassium 3.4, primary team to continue  supplementation. Sodium improved to 127 with Tolvaptan (6/3) Continue Ensure supplementation twice daily SSI managed by primary team   LOS: 5 Anola King 6/4/202510:13 AM  [place], [place] [phone]

## 2024-03-24 NOTE — Plan of Care (Signed)
  Problem: Education: Goal: Ability to describe self-care measures that may prevent or decrease complications (Diabetes Survival Skills Education) will improve Outcome: Progressing Goal: Individualized Educational Video(s) Outcome: Progressing   Problem: Coping: Goal: Ability to adjust to condition or change in health will improve Outcome: Progressing   Problem: Health Behavior/Discharge Planning: Goal: Ability to identify and utilize available resources and services will improve Outcome: Progressing Goal: Ability to manage health-related needs will improve Outcome: Progressing   Problem: Metabolic: Goal: Ability to maintain appropriate glucose levels will improve Outcome: Progressing   Problem: Nutritional: Goal: Maintenance of adequate nutrition will improve Outcome: Progressing Goal: Progress toward achieving an optimal weight will improve Outcome: Progressing   Problem: Skin Integrity: Goal: Risk for impaired skin integrity will decrease Outcome: Progressing   Problem: Fluid Volume: Goal: Ability to maintain a balanced intake and output will improve Outcome: Not Progressing

## 2024-03-24 NOTE — Progress Notes (Signed)
 Advanced Heart Failure Rounding Note   Subjective:    NE down 13-> 7. Remains on milrinone 0.25  SBP 80-90s. Co-ox 67%  CVP 12  Diuresing well on IV lasix . Out 3.2L  Na 121 -> 127 (rec'd Tolvaptan) K 3.4 Scr 2.4-> 2.3 -> 2.1  Feels very tired. Denies CP or orthopnea.    Objective:   Weight Range:  Vital Signs:   Temp:  [97.9 F (36.6 C)-98.5 F (36.9 C)] 97.9 F (36.6 C) (06/04 0400) Pulse Rate:  [71-118] 91 (06/04 0930) Resp:  [12-31] 18 (06/04 0930) BP: (67-145)/(50-94) 95/58 (06/04 0930) SpO2:  [93 %-100 %] 95 % (06/04 0930) Weight:  [70.2 kg] 70.2 kg (06/04 0720) Last BM Date :  (PTA)  Weight change: Filed Weights   03/22/24 1300 03/23/24 0425 03/24/24 0720  Weight: 70.3 kg 69.9 kg 70.2 kg    Intake/Output:   Intake/Output Summary (Last 24 hours) at 03/24/2024 1003 Last data filed at 03/24/2024 0848 Gross per 24 hour  Intake 382.96 ml  Output 2650 ml  Net -2267.04 ml     Physical Exam: General:  Elderly No resp difficulty HEENT: normal Neck: supple. JVP 12 bilateral bruits [id number] rate & rhythm. 2/6 AS Lungs: clear Abdomen: soft, nontender, nondistended. No hepatosplenomegaly. No bruits or masses. Good bowel sounds. Extremities: no cyanosis, clubbing, rash, 2+ edema Neuro: alert & orientedx3, cranial nerves grossly intact. moves all 4 extremities w/o difficulty. Affect pleasant  Telemetry: AF 90-110 Personally reviewed  Labs: Basic Metabolic Panel: Recent Labs  Lab 03/19/24 1038 03/19/24 2009 03/22/24 0321 03/22/24 0910 03/22/24 1118 03/23/24 0355 03/23/24 1446 03/24/24 0427  NA 127*   < > 124* 126* 126*  127* 121* 125* 127*  K 4.4   < > 2.7* 4.2 3.8  3.8 2.9* 4.1 3.4*  CL 90*   < > 85* 87*  --  82* 84* 85*  CO2 23   < > 24 26  --  24 25 27   GLUCOSE 159*   < > 158* 140*  --  375* 208* 168*  BUN 76*   < > 80* 76*  --  67* 69* 70*  CREATININE 2.81*   < > 2.41* 2.43*  --  2.32* 2.37* 2.15*  CALCIUM  9.5   < > 9.6 9.7  --  8.8* 9.4  9.2  MG 2.3  --  2.8*  --   --  2.3  --  2.4  PHOS 4.7*  --   --   --   --   --   --  3.3   < > = values in this interval not displayed.    Liver Function Tests: Recent Labs  Lab 03/19/24 1038 03/23/24 1446  AST 19 22  ALT 16 12  ALKPHOS 88 80  BILITOT 2.1* 3.0*  PROT 6.3* 6.4*  ALBUMIN  3.8 3.8   No results for input(s): "LIPASE", "AMYLASE" in the last 168 hours. No results for input(s): "AMMONIA" in the last 168 hours.  CBC: Recent Labs  Lab 03/20/24 0420 03/21/24 0954 03/22/24 0321 03/22/24 1118 03/23/24 0355 03/24/24 0427  WBC 5.5 5.3 5.7  --  7.0 8.4  HGB 12.0 12.6 12.3 14.6  14.6 11.5* 12.0  HCT 38.3 39.8 39.6 43.0  43.0 37.1 38.6  MCV 68.8* 69.0* 68.9*  --  69.5* 69.2*  PLT 281 298 269  --  312 283    Cardiac Enzymes: No results for input(s): "CKTOTAL", "CKMB", "CKMBINDEX", "TROPONINI" in the last 168 hours.  BNP: BNP (last 3 results) Recent Labs    12/19/23 1624 03/19/24 1038  BNP 132.4* 265.4*    ProBNP (last 3 results) No results for input(s): "PROBNP" in the last 8760 hours.    Other results:  Imaging: DG Chest Port 1 View Result Date: 03/22/2024 CLINICAL DATA:  PICC line placement EXAM: PORTABLE CHEST 1 VIEW COMPARISON:  03/19/2024 FINDINGS: A left PICC catheter has been placed with tip projecting over the cavoatrial junction region. No pneumothorax. Cardiac enlargement. Lungs are clear. No pleural effusion. Mediastinal contours appear intact. IMPRESSION: Left PICC catheter with tip projecting over the cavoatrial junction region. No pneumothorax. Cardiac enlargement. No evidence of active pulmonary disease. Electronically Signed   By: Boyce Byes M.D.   On: 03/22/2024 18:05   US  EKG SITE RITE Result Date: 03/22/2024 If Site Rite image not attached, placement could not be confirmed due to current cardiac rhythm.  CARDIAC CATHETERIZATION Result Date: 03/22/2024 Successful right heart catheterization via the left basilic vein. Right heart  catheterization showed severely elevated right atrial pressure, mild pulmonary hypertension, moderately elevated wedge pressure and severely reduced cardiac output. RA: 18 mmHg RV: 38/5 with an EDP of 22 mmHg PW: 26 mmHg PA: 38/22 with a mean of 30 mmHg PA sat was 55%. Cardiac output was 3.06 with an index of 1.85. Recommendations: The patient has evidence of biventricular failure with prominent RV dysfunction.  She likely has underlying cardiorenal syndrome and not able to diurese with some form of inotropic therapy.  Recommend starting small milrinone drip and IV diuresis.  I discontinued Toprol  and midodrine .  Will consult advanced heart failure to help with management.     Medications:     Scheduled Medications:  allopurinol   100 mg Oral Daily   apixaban   5 mg Oral BID   Chlorhexidine  Gluconate Cloth  6 each Topical Daily   docusate sodium   100 mg Oral BID   feeding supplement  237 mL Oral BID BM   folic acid   1 mg Oral Daily   furosemide   80 mg Intravenous BID   insulin  aspart  0-5 Units Subcutaneous QHS   insulin  aspart  0-9 Units Subcutaneous TID WC   insulin  glargine-yfgn  10 Units Subcutaneous BID   latanoprost   1 drop Both Eyes QHS   pantoprazole   40 mg Oral BID   polyethylene glycol  17 g Oral Daily   potassium chloride   40 mEq Oral Q4H   [START ON 03/26/2024] rosuvastatin   5 mg Oral Weekly   senna  1 tablet Oral Daily   sodium chloride  flush  10-40 mL Intracatheter Q12H    Infusions:  milrinone 0.25 mcg/kg/min (03/24/24 0859)   norepinephrine (LEVOPHED) Adult infusion 7 mcg/min (03/24/24 0848)    PRN Medications: acetaminophen , albuterol , bisacodyl , dextromethorphan-guaiFENesin , ondansetron  (ZOFRAN ) IV, mouth rinse, oxyCODONE, sodium chloride  flush, traMADol , traZODone   Assessment/Plan:   1. Acute on Chronic Biventricular Heart Failure; HFpEF; Predominantly RV Failure - EF 60-65% with mod-severe AS normal RV on echo read, however RHC with low output biventricular  heart failure - RHC 03/22/24: RA 18, PA 38/22 (30), PCW 26, CO/CI 3.06/1.85, PAPi 0.89 - on NE 13 and milrinone 0.25 SBP 90s. Co-ox 67%  Diuresing well on IV lasix . Still volume overloaded. 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   - Agree with short-course of inotropic/pressor support to see how well  we can optimize her but suspect long-term options are quite limited. I discussed this again with her and also spoke with her brother at bedside.  - Once fully diuresed will attempt wean of NE/milrinone. Will start midodrine  to facilitate - Prior to d/c will need cMRI to look for infiltrative disease  2. AKI on CKD 3b:  - baseline Cr ~2.0. Up to 2.8 on admission - improving with diuresis and inotrope/pressor support. Scr 2.1 today - nephrology following, appreciate recs  3. Moderate to severe AS/moderate MR - follow   4. Small pericardial Effusion - h/o pericardial effusion s/p pericardiocentesis 700 ml in 07/2021. Bloody, neg for malignancy. - small w/o tamponade on echo - no change   5. Atrial Fibrillation: Permanent - rate controlled on tele - hold ? blocker with milrinone and low output - continue Eliquis  5 mg bid - rate a bit faster today. Will follow   7. Hyponatremia - improved with tolvaptan 6/3 - restrict FW   8.Hypokalemia - supp as needed. Can add back spiro as BP toelrates - continue replacement for goal K>4   9. H/o cirrhosis - s/p paracentesis in 2022 - CT chest with suspected liver cirrhosis - suspect this was/is in the setting of cardiohepatic syndrome - AST/ALT nl on admit  10. Microsytosis - check iron  stores  CRITICAL CARE Performed by: Jules Oar  Total critical care time: 40 minutes  Critical care time was exclusive of separately billable procedures and treating other patients.  Critical care was  necessary to treat or prevent imminent or life-threatening deterioration.  Critical care was time spent personally by me (independent of midlevel providers or residents) on the following activities: development of treatment plan with patient and/or surrogate as well as nursing, discussions with consultants, evaluation of patient's response to treatment, examination of patient, obtaining history from patient or surrogate, ordering and performing treatments and interventions, ordering and review of laboratory studies, ordering and review of radiographic studies, pulse oximetry and re-evaluation of patient's condition.   Length of Stay: 5   Jules Oar MD 03/24/2024, 10:03 AM  Advanced Heart Failure Team Pager [phone] (M-F; 7a - 4p)  Please contact [facility] for night-coverage after hours (4p -7a ) and weekends on [email or web address]

## 2024-03-25 ENCOUNTER — Inpatient Hospital Stay

## 2024-03-25 DIAGNOSIS — I5082 Biventricular heart failure: Secondary | ICD-10-CM | POA: Diagnosis not present

## 2024-03-25 DIAGNOSIS — I3139 Other pericardial effusion (noninflammatory): Secondary | ICD-10-CM | POA: Diagnosis not present

## 2024-03-25 DIAGNOSIS — I5033 Acute on chronic diastolic (congestive) heart failure: Secondary | ICD-10-CM | POA: Diagnosis not present

## 2024-03-25 DIAGNOSIS — R570 Cardiogenic shock: Secondary | ICD-10-CM | POA: Diagnosis not present

## 2024-03-25 DIAGNOSIS — I060 Rheumatic aortic stenosis: Secondary | ICD-10-CM | POA: Diagnosis not present

## 2024-03-25 LAB — COOXEMETRY PANEL
Carboxyhemoglobin: 1.5 % (ref 0.5–1.5)
Methemoglobin: 0.8 % (ref 0.0–1.5)
O2 Saturation: 63.9 %
Total hemoglobin: 12.2 g/dL (ref 12.0–16.0)
Total oxygen content: 62.4 %

## 2024-03-25 LAB — BASIC METABOLIC PANEL WITH GFR
Anion gap: 12 (ref 5–15)
Anion gap: 14 (ref 5–15)
BUN: 71 mg/dL — ABNORMAL HIGH (ref 8–23)
BUN: 70 mg/dL — ABNORMAL HIGH (ref 8–23)
CO2: 27 mmol/L (ref 22–32)
CO2: 28 mmol/L (ref 22–32)
Calcium: 8.9 mg/dL (ref 8.9–10.3)
Calcium: 9.1 mg/dL (ref 8.9–10.3)
Chloride: 88 mmol/L — ABNORMAL LOW (ref 98–111)
Chloride: 82 mmol/L — ABNORMAL LOW (ref 98–111)
Creatinine, Ser: 1.85 mg/dL — ABNORMAL HIGH (ref 0.44–1.00)
Creatinine, Ser: 1.87 mg/dL — ABNORMAL HIGH (ref 0.44–1.00)
GFR, Estimated: 28 mL/min — ABNORMAL LOW (ref >60)
GFR, Estimated: 27 mL/min — ABNORMAL LOW (ref >60)
Glucose, Bld: 239 mg/dL — ABNORMAL HIGH (ref 70–99)
Glucose, Bld: 235 mg/dL — ABNORMAL HIGH (ref 70–99)
Potassium: 3.5 mmol/L (ref 3.5–5.1)
Potassium: 3.7 mmol/L (ref 3.5–5.1)
Sodium: 127 mmol/L — ABNORMAL LOW (ref 135–145)
Sodium: 124 mmol/L — ABNORMAL LOW (ref 135–145)

## 2024-03-25 LAB — IRON AND TIBC
Iron: 16 ug/dL — ABNORMAL LOW (ref 28–170)
Saturation Ratios: 3 % — ABNORMAL LOW (ref 10.4–31.8)
TIBC: 524 ug/dL — ABNORMAL HIGH (ref 250–450)
UIBC: 508 ug/dL

## 2024-03-25 LAB — PHOSPHORUS: Phosphorus: 3 mg/dL (ref 2.5–4.6)

## 2024-03-25 LAB — CBC
HCT: 37.6 % (ref 36.0–46.0)
Hemoglobin: 11.5 g/dL — ABNORMAL LOW (ref 12.0–15.0)
MCH: 21.3 pg — ABNORMAL LOW (ref 26.0–34.0)
MCHC: 30.6 g/dL (ref 30.0–36.0)
MCV: 69.8 fL — ABNORMAL LOW (ref 80.0–100.0)
Platelets: 289 10*3/uL (ref 150–400)
RBC: 5.39 MIL/uL — ABNORMAL HIGH (ref 3.87–5.11)
RDW: 20.3 % — ABNORMAL HIGH (ref 11.5–15.5)
WBC: 10.2 10*3/uL (ref 4.0–10.5)
nRBC: 0 % (ref 0.0–0.2)

## 2024-03-25 LAB — GLUCOSE, CAPILLARY
Glucose-Capillary: 184 mg/dL — ABNORMAL HIGH (ref 70–99)
Glucose-Capillary: 292 mg/dL — ABNORMAL HIGH (ref 70–99)
Glucose-Capillary: 246 mg/dL — ABNORMAL HIGH (ref 70–99)
Glucose-Capillary: 153 mg/dL — ABNORMAL HIGH (ref 70–99)

## 2024-03-25 LAB — MAGNESIUM: Magnesium: 2.3 mg/dL (ref 1.7–2.4)

## 2024-03-25 LAB — FERRITIN: Ferritin: 13 ng/mL (ref 11–307)

## 2024-03-25 MED ORDER — LACTULOSE 10 GM/15ML PO SOLN
30.0000 g | Freq: Once | ORAL | Status: AC
  Administered 2024-03-25: 30 g via ORAL
  Filled 2024-03-25: qty 60

## 2024-03-25 MED ORDER — POTASSIUM CHLORIDE CRYS ER 20 MEQ PO TBCR
40.0000 meq | EXTENDED_RELEASE_TABLET | Freq: Once | ORAL | Status: DC
  Filled 2024-03-25: qty 2

## 2024-03-25 MED ORDER — INSULIN ASPART 100 UNIT/ML IJ SOLN
3.0000 [IU] | Freq: Three times a day (TID) | INTRAMUSCULAR | Status: DC
  Administered 2024-03-25 – 2024-04-03 (×16): 3 [IU] via SUBCUTANEOUS
  Filled 2024-03-25 (×17): qty 1

## 2024-03-25 MED ORDER — METOLAZONE 2.5 MG PO TABS
2.5000 mg | ORAL_TABLET | Freq: Once | ORAL | Status: AC
  Administered 2024-03-25: 2.5 mg via ORAL
  Filled 2024-03-25: qty 1

## 2024-03-25 MED ORDER — GADOBUTROL 1 MMOL/ML IV SOLN
10.0000 mL | Freq: Once | INTRAVENOUS | Status: AC | PRN
  Administered 2024-03-25: 10 mL via INTRAVENOUS

## 2024-03-25 MED ORDER — POTASSIUM CHLORIDE CRYS ER 20 MEQ PO TBCR
40.0000 meq | EXTENDED_RELEASE_TABLET | ORAL | Status: AC
  Administered 2024-03-25 (×2): 40 meq via ORAL
  Filled 2024-03-25 (×2): qty 2

## 2024-03-25 NOTE — Progress Notes (Addendum)
 [facility] [place], [place] 03/25/24  Subjective:   Hospital day # 6   Patient known to our practice from outpatient follow-up of CKD.  She presented to the emergency room with worsening shortness of breath with exertion.  Normally she is able to drive and carry on activities of daily living with ease.  For the last 2 to 3 weeks and especially last 4 days she has been getting short of breath and having to rest even to go from 1 room to the next.  She has developed worsening lower extremity edema.  She is now presented to the hospital for further management.   Patient seen sitting at bedside Preparing for breakfast States she rested well overnight Remains on room air Milrinone In place  Complains of unsuccessful attempts at bowel movement  Renal: 06/04 0701 - 06/05 0700 In: 522.7 [P.O.:240; I.V.:282.7] Out: 2225 [Urine:2225] Lab Results  Component Value Date   CREATININE 1.85 (H) 03/25/2024   CREATININE 2.15 (H) 03/24/2024   CREATININE 2.37 (H) 03/23/2024     Objective:  Vital signs in last 24 hours:  Temp:  [97.9 F (36.6 C)-99.1 F (37.3 C)] 97.9 F (36.6 C) (06/05 0400) Pulse Rate:  [81-117] 97 (06/05 0800) Resp:  [11-25] 18 (06/05 0800) BP: (64-122)/(40-107) 98/64 (06/05 0800) SpO2:  [93 %-100 %] 97 % (06/05 0800) Weight:  [70.3 kg] 70.3 kg (06/05 0500)  Weight change:  Filed Weights   03/23/24 0425 03/24/24 0720 03/25/24 0500  Weight: 69.9 kg 70.2 kg 70.3 kg    Intake/Output:    Intake/Output Summary (Last 24 hours) at 03/25/2024 1001 Last data filed at 03/25/2024 0800 Gross per 24 hour  Intake 523.23 ml  Output 2675 ml  Net -2151.77 ml     Physical Exam: General: No acute distress  HEENT Moist mucous membranes  Pulm/lungs Normal breathing effort on room air  CVS/Heart Irregular rhythm  Abdomen:  Mildly distended  Extremities: trace pitting edema and lymphedema  Neurologic: Alert and oriented  Skin: No acute rashes           Basic Metabolic Panel:  Recent Labs  Lab 03/19/24 1038 03/19/24 2009 03/22/24 0321 03/22/24 0910 03/22/24 1118 03/23/24 0355 03/23/24 1446 03/24/24 0427 03/25/24 0532  NA 127*   < > 124* 126* 126*  127* 121* 125* 127* 127*  K 4.4   < > 2.7* 4.2 3.8  3.8 2.9* 4.1 3.4* 3.5  CL 90*   < > 85* 87*  --  82* 84* 85* 88*  CO2 23   < > 24 26  --  24 25 27 27   GLUCOSE 159*   < > 158* 140*  --  375* 208* 168* 239*  BUN 76*   < > 80* 76*  --  67* 69* 70* 71*  CREATININE 2.81*   < > 2.41* 2.43*  --  2.32* 2.37* 2.15* 1.85*  CALCIUM  9.5   < > 9.6 9.7  --  8.8* 9.4 9.2 8.9  MG 2.3  --  2.8*  --   --  2.3  --  2.4 2.3  PHOS 4.7*  --   --   --   --   --   --  3.3 3.0   < > = values in this interval not displayed.     CBC: Recent Labs  Lab 03/21/24 0954 03/22/24 0321 03/22/24 1118 03/23/24 0355 03/24/24 0427 03/25/24 0532  WBC 5.3 5.7  --  7.0 8.4 10.2  HGB 12.6 12.3  14.6  14.6 11.5* 12.0 11.5*  HCT 39.8 39.6 43.0  43.0 37.1 38.6 37.6  MCV 69.0* 68.9*  --  69.5* 69.2* 69.8*  PLT 298 269  --  312 283 289      Lab Results  Component Value Date   HEPBSAG NON REACTIVE 03/21/2024   HEPBSAB NON REACTIVE 04/11/2021   HEPBIGM NON REACTIVE 03/21/2024      Microbiology:  Recent Results (from the past 240 hours)  MRSA Next Gen by PCR, Nasal     Status: None   Collection Time: 03/22/24 12:53 PM   Specimen: Nasal Mucosa; Nasal Swab  Result Value Ref Range Status   MRSA by PCR Next Gen NOT DETECTED NOT DETECTED Final    Comment: (NOTE) The GeneXpert MRSA Assay (FDA approved for NASAL specimens only), is one component of a comprehensive MRSA colonization surveillance program. It is not intended to diagnose MRSA infection nor to guide or monitor treatment for MRSA infections. Test performance is not FDA approved in patients less than 43 years old. Performed at [facility], [address] [place], [place] [address]     Coagulation Studies: No results for  input(s): "LABPROT", "INR" in the last 72 hours.  Urinalysis: No results for input(s): "COLORURINE", "LABSPEC", "PHURINE", "GLUCOSEU", "HGBUR", "BILIRUBINUR", "KETONESUR", "PROTEINUR", "UROBILINOGEN", "NITRITE", "LEUKOCYTESUR" in the last 72 hours.  Invalid input(s): "APPERANCEUR"    Imaging: No results found.    Medications:    milrinone 0.25 mcg/kg/min (03/25/24 0728)   norepinephrine (LEVOPHED) Adult infusion 7 mcg/min (03/25/24 0728)    allopurinol   100 mg Oral Daily   apixaban   5 mg Oral BID   Chlorhexidine  Gluconate Cloth  6 each Topical Daily   docusate sodium   100 mg Oral BID   feeding supplement  237 mL Oral BID BM   folic acid   1 mg Oral Daily   furosemide   80 mg Intravenous BID   insulin  aspart  0-15 Units Subcutaneous TID WC   insulin  aspart  0-5 Units Subcutaneous QHS   insulin  glargine-yfgn  10 Units Subcutaneous BID   latanoprost   1 drop Both Eyes QHS   midodrine   10 mg Oral Q8H   pantoprazole   40 mg Oral BID   polyethylene glycol  17 g Oral Daily   potassium chloride   40 mEq Oral Q4H   potassium chloride   40 mEq Oral Once   [START ON 03/26/2024] rosuvastatin   5 mg Oral Weekly   sodium chloride  flush  10-40 mL Intracatheter Q12H   acetaminophen , albuterol , bisacodyl , dextromethorphan-guaiFENesin , ondansetron  (ZOFRAN ) IV, mouth rinse, oxyCODONE, sodium chloride  flush, traMADol , traZODone  Assessment/ Plan:  78 y.o. female with  multiple medical problems including chronic kidney disease, hypertension, atrial fibrillation, hyperlipidemia, chronic lower extremity edema, diastolic dysfunction, diabetes, osteoarthritis of the knee, chronic back pain  admitted on 03/19/2024 for Shortness of breath [R06.02] Acute on chronic diastolic heart failure (HCC) [I50.33] Acute on chronic diastolic CHF (congestive heart failure) (HCC) [I50.33]  1.  Acute on chronic diastolic CHF, with worsening lower extremity edema and shortness of breath 2.  Acute kidney injury on chronic  kidney disease stage IV.  Baseline creatinine 2.6/GFR 19 from April 2025. Creatinine has worsened to 2.65, likely secondary to aggressive diuresis. 3.  Hypokalemia 4.  Hyponatremia. 5.  Diabetes type 2 with CKD.  Hemoglobin A1c 7.4% from February 2025. 6.  Constipation  2D echo 03/20/2024-LVEF 60 to 65%, moderate concentric LVH, normal right ventricular systolic function small pericardial effusion, normal aortic valve, severe mitral annular  calcification  Plan: RHC on 6/2 showed severely decreased cardiac output, biventricular failure with RV dysfunction. Milrinone drip with IV Furosemide . Cardiology will give Metolazone  2.5mg  today.  Adequate diuresis,cardiology will manage. Potassium 3.5 Sodium remains 127 with Tolvaptan (6/3) Continue Ensure supplementation twice daily Glucose elevated at times, SSI managed by primary team Will order one-time dose lactulose 30 g.  Continue bowel regimen.   LOS: 6 [facility] 6/5/202510:01 AM  [address] [place], [place] [phone]

## 2024-03-25 NOTE — Inpatient Diabetes Management (Signed)
 Inpatient Diabetes Program Recommendations  AACE/ADA: New Consensus Statement on Inpatient Glycemic Control   Target Ranges:  Prepandial:   less than 140 mg/dL      Peak postprandial:   less than 180 mg/dL (1-2 hours)      Critically ill patients:  140 - 180 mg/dL    Latest Reference Range & Units 03/24/24 07:16 03/24/24 11:37 03/24/24 15:50 03/24/24 22:23 03/25/24 08:12  Glucose-Capillary 70 - 99 mg/dL 409 (H) 811 (H) 914 (H) 242 (H) 184 (H)   Review of Glycemic Control  Diabetes history: DM2 Outpatient Diabetes medications: Jardiance  25 mg daily, Toujeo  16-20 units daily, Rybelsus  7 mg daily; Dexcom G7  Current orders for Inpatient glycemic control: Semglee  10 units BID, Novolog  0-15 units TID with meals, Novolog  0-5 units QHS  Inpatient Diabetes Program Recommendations:    Insulin : Please consider ordering Novolog  3 units TID with meals for meal coverage if patient eats at least 50% of meals.  Thanks, Beacher Limerick, RN, MSN, CDCES Diabetes Coordinator Inpatient Diabetes Program [phone] (Team Pager from 8am to 5pm)

## 2024-03-25 NOTE — Evaluation (Signed)
 Physical Therapy Evaluation Patient Details Name: Karen Farley MRN: [id number] DOB: [birth year] Today's Date: 03/25/2024  History of Present Illness  78 y.o. female  who presents with SOB and worsening leg edema. Admitted with Acute Decompensated Biventricular Heart Failure/HFpEF, and moderate to severe Aortic Stenosis complicated by Cardiogenic Shock and Cardiorenal Syndrome requiring inotrope's and vasopressors. PMH of diastolic CHF, hypertension, hyperlipidemia, diabetes mellitus, PAD, gout, GI bleeding, CKD-3B, A-fib on Eliquis , chronic hyponatremia, chronic venous insufficiency, lymphedema.  Clinical Impression  Patient admitted with the above. PTA, patient lives with husband and was modI with use of RW recently (February 2025). Patient presents with weakness, impaired balance, and decreased activity tolerance. Stood from EOB with CGA and ambulated within room with RW and CGA. HR up to 120 with activity but otherwise stable on RA. Patient will benefit from skilled PT services during acute stay to address listed deficits. Patient will benefit from ongoing therapy at discharge to maximize functional independence and safety.         If plan is discharge home, recommend the following: A little help with walking and/or transfers;A little help with bathing/dressing/bathroom;Assistance with cooking/housework;Assist for transportation;Help with stairs or ramp for entrance   Can travel by private vehicle        Equipment Recommendations None recommended by PT  Recommendations for Other Services       Functional Status Assessment Patient has had a recent decline in their functional status and demonstrates the ability to make significant improvements in function in a reasonable and predictable amount of time.     Precautions / Restrictions Precautions Precautions: Fall Recall of Precautions/Restrictions: Intact Restrictions Weight Bearing Restrictions Per Provider Order: No       Mobility  Bed Mobility               General bed mobility comments: sitting EOB on arrival    Transfers Overall transfer level: Needs assistance Equipment used: Rolling Karen Farley (2 wheels) Transfers: Sit to/from Stand Sit to Stand: Contact guard assist                Ambulation/Gait Ambulation/Gait assistance: Contact guard assist Gait Distance (Feet): 30 Feet Assistive device: Rolling Karen Farley (2 wheels) Gait Pattern/deviations: Step-through pattern, Decreased stride length Gait velocity: decreased     General Gait Details: CGA for safety. No LOB noted. Slow gait  Stairs            Wheelchair Mobility     Tilt Bed    Modified Rankin (Stroke Patients Only)       Balance Overall balance assessment: Mild deficits observed, not formally tested                                           Pertinent Vitals/Pain Pain Assessment Pain Assessment: No/denies pain    Home Living Family/patient expects to be discharged to:: Private residence Living Arrangements: Spouse/significant other Available Help at Discharge: Family;Available 24 hours/day Type of Home: House (small condo) Home Access: Level entry       Home Layout: One level Home Equipment: Rolling Karen Farley (2 wheels);Grab bars - tub/shower;Shower seat;Hand held shower head      Prior Function Prior Level of Function : Independent/Modified Independent;Driving             Mobility Comments: recently has used RW since February       Extremity/Trunk Assessment   Upper Extremity Assessment  Upper Extremity Assessment: Defer to OT evaluation    Lower Extremity Assessment Lower Extremity Assessment: Generalized weakness    Cervical / Trunk Assessment Cervical / Trunk Assessment: Normal  Communication   Communication Communication: No apparent difficulties    Cognition Arousal: Alert Behavior During Therapy: WFL for tasks assessed/performed   PT - Cognitive  impairments: No apparent impairments                         Following commands: Intact       Cueing       General Comments General comments (skin integrity, edema, etc.): HR up to 120    Exercises     Assessment/Plan    PT Assessment Patient needs continued PT services  PT Problem List Decreased strength;Decreased activity tolerance;Decreased balance;Decreased mobility;Decreased coordination;Decreased knowledge of use of DME;Decreased safety awareness;Decreased knowledge of precautions;Cardiopulmonary status limiting activity       PT Treatment Interventions DME instruction;Gait training;Functional mobility training;Therapeutic activities;Therapeutic exercise;Balance training;Neuromuscular re-education;Patient/family education    PT Goals (Current goals can be found in the Care Plan section)  Acute Rehab PT Goals Patient Stated Goal: to go home PT Goal Formulation: With patient Time For Goal Achievement: 04/08/24 Potential to Achieve Goals: Good    Frequency Min 2X/week     Co-evaluation               AM-PAC PT "6 Clicks" Mobility  Outcome Measure Help needed turning from your back to your side while in a flat bed without using bedrails?: A Little Help needed moving from lying on your back to sitting on the side of a flat bed without using bedrails?: A Little Help needed moving to and from a bed to a chair (including a wheelchair)?: A Little Help needed standing up from a chair using your arms (e.g., wheelchair or bedside chair)?: A Little Help needed to walk in hospital room?: A Little Help needed climbing 3-5 steps with a railing? : A Little 6 Click Score: 18    End of Session   Activity Tolerance: Patient tolerated treatment well Patient left: in chair;with call bell/phone within reach Nurse Communication: Mobility status PT Visit Diagnosis: Other abnormalities of gait and mobility (R26.89);Unsteadiness on feet (R26.81);Muscle weakness  (generalized) (M62.81)    Time: 2956-2130 PT Time Calculation (min) (ACUTE ONLY): 27 min   Charges:   PT Evaluation $PT Eval Moderate Complexity: 1 Mod   PT General Charges $$ ACUTE PT VISIT: 1 Visit         Karen Farley, PT, DPT Physical Therapist - [facility]  [facility]   Karen Farley Halls 03/25/2024, 11:06 AM

## 2024-03-25 NOTE — Evaluation (Signed)
 Occupational Therapy Evaluation Patient Details Name: Karen Farley MRN: [id number] DOB: 06/02/1946 Today's Date: 03/25/2024   History of Present Illness   78 y.o. female  who presents with SOB and worsening leg edema. Admitted with Acute Decompensated Biventricular Heart Failure/HFpEF, and moderate to severe Aortic Stenosis complicated by Cardiogenic Shock and Cardiorenal Syndrome requiring inotrope's and vasopressors. PMH of diastolic CHF, hypertension, hyperlipidemia, diabetes mellitus, PAD, gout, GI bleeding, CKD-3B, A-fib on Eliquis , chronic hyponatremia, chronic venous insufficiency, lymphedema.     Clinical Impressions Pt was seen for OT evaluation this date. PTA, pt resides in a level entry condo that is one level with her husband. Reports she is typically IND, driving and community mobile without AD use however has been more fatigued since February and utilizing a RW as needed for energy conservation and not going out as much. Denies falls.  Pt presents to acute OT demonstrating impaired ADL performance and functional mobility 2/2 weakness and low activity tolerance. Pt is found seated at EOB. LB dressing to don slip on sandals with supervision. Performed STS from EOB to RW with CGA and ambulated to the sink to perform standing oral care with CGA/SBA. HR up to 120 with activity, otherwise stable and able to stand for extended period of time. She ambulated ~20 feet using RW with CGA for safety to sit in recliner. No LOB throughout ambulation and pt just generally feeling tired.  Pt would benefit from skilled OT services to address noted impairments and functional limitations to maximize safety and independence while minimizing falls risk and caregiver burden. Do anticipate the need for follow up OT services upon acute hospital DC.      If plan is discharge home, recommend the following:   A little help with walking and/or transfers;A little help with  bathing/dressing/bathroom;Assistance with cooking/housework;Help with stairs or ramp for entrance     Functional Status Assessment   Patient has had a recent decline in their functional status and demonstrates the ability to make significant improvements in function in a reasonable and predictable amount of time.     Equipment Recommendations   None recommended by OT (has needed equipment)     Recommendations for Other Services         Precautions/Restrictions   Precautions Precautions: Fall Recall of Precautions/Restrictions: Intact Restrictions Weight Bearing Restrictions Per Provider Order: No     Mobility Bed Mobility               General bed mobility comments: seated EOB on arrival    Transfers Overall transfer level: Needs assistance Equipment used: Rolling walker (2 wheels) Transfers: Sit to/from Stand Sit to Stand: Contact guard assist           General transfer comment: CGA for STS from EOB to RW and CGA for in room mobility to sink to perform oral care and walk to recliner      Balance Overall balance assessment: Needs assistance Sitting-balance support: Feet supported Sitting balance-Leahy Scale: Normal     Standing balance support: During functional activity, Single extremity supported Standing balance-Leahy Scale: Good                             ADL either performed or assessed with clinical judgement   ADL Overall ADL's : Needs assistance/impaired     Grooming: Oral care;Standing;Contact guard assist               Lower Body Dressing: Supervision/safety;Sitting/lateral  leans Lower Body Dressing Details (indicate cue type and reason): don slips on sandals             Functional mobility during ADLs: Contact guard assist;Rolling walker (2 wheels)       Vision         Perception         Praxis         Pertinent Vitals/Pain Pain Assessment Pain Assessment: No/denies pain      Extremity/Trunk Assessment Upper Extremity Assessment Upper Extremity Assessment: Generalized weakness   Lower Extremity Assessment Lower Extremity Assessment: Generalized weakness   Cervical / Trunk Assessment Cervical / Trunk Assessment: Normal   Communication Communication Communication: No apparent difficulties   Cognition Arousal: Alert Behavior During Therapy: WFL for tasks assessed/performed                                 Following commands: Intact       Cueing  General Comments      VSS, HR up to 120 with activity   Exercises Other Exercises Other Exercises: Edu on role of OT in acute setting.   Shoulder Instructions      Home Living Family/patient expects to be discharged to:: Private residence Living Arrangements: Spouse/significant other Available Help at Discharge: Family;Available 24 hours/day Type of Home: House (small condo) Home Access: Level entry     Home Layout: One level     Bathroom Shower/Tub: Producer, television/film/video: Standard     Home Equipment: Agricultural consultant (2 wheels);Grab bars - tub/shower;Shower seat;Hand held shower head          Prior Functioning/Environment Prior Level of Function : Independent/Modified Independent;Driving             Mobility Comments: recently has used RW since February ADLs Comments: IND, driving, running errands prior to becoming more fatigued    OT Problem List: Decreased strength;Decreased activity tolerance   OT Treatment/Interventions: Self-care/ADL training;Therapeutic exercise;Therapeutic activities;Energy conservation;Patient/family education;Balance training      OT Goals(Current goals can be found in the care plan section)   Acute Rehab OT Goals Patient Stated Goal: return home OT Goal Formulation: With patient Time For Goal Achievement: 04/08/24 Potential to Achieve Goals: Good ADL Goals Pt Will Perform Lower Body Dressing: with  supervision;sitting/lateral leans;sit to/from stand Pt Will Transfer to Toilet: with supervision;ambulating;regular height toilet Pt Will Perform Toileting - Clothing Manipulation and hygiene: with supervision;with modified independence;sit to/from stand;sitting/lateral leans Additional ADL Goal #1: Pt will demo implementation of 1 learned ECS during ADL performance 2/2 trials to maximize IND/safety during ADLs and prevent overexertion.   OT Frequency:  Min 2X/week    Co-evaluation              AM-PAC OT "6 Clicks" Daily Activity     Outcome Measure Help from another person eating meals?: None Help from another person taking care of personal grooming?: None Help from another person toileting, which includes using toliet, bedpan, or urinal?: A Little Help from another person bathing (including washing, rinsing, drying)?: A Little Help from another person to put on and taking off regular upper body clothing?: None Help from another person to put on and taking off regular lower body clothing?: A Little 6 Click Score: 21   End of Session Equipment Utilized During Treatment: Gait belt;Rolling walker (2 wheels) Nurse Communication: Mobility status  Activity Tolerance: Patient tolerated treatment well Patient left: in  chair;with call bell/phone within reach  OT Visit Diagnosis: Other abnormalities of gait and mobility (R26.89)                Time: 1610-9604 OT Time Calculation (min): 27 min Charges:  OT General Charges $OT Visit: 1 Visit OT Evaluation $OT Eval Moderate Complexity: 1 Mod Sinjin Amero, OTR/L 03/25/24, 1:02 PM  Ronniesha Seibold E Siona Coulston 03/25/2024, 12:59 PM

## 2024-03-25 NOTE — Progress Notes (Signed)
 NAME:  Karen Farley, MRN:  [id number], DOB:  15-May-1946, LOS: 6 ADMISSION DATE:  03/19/2024, CONSULTATION DATE: 03/22/2024 REFERRING MD: Dr. Alvenia Aus, CHIEF COMPLAINT: Shortness of Breath   Brief Pt Description / Synopsis:  78 y.o. female admitted with Acute Decompensated Biventricular Heart Failure/HFpEF, and moderate to severe Aortic Stenosis complicated by Cardiogenic Shock and Cardiorenal Syndrome requiring inotrope's and vasopressors.  History of Present Illness:  This is a 78 yo female who presented to [facility] ER on 05/30 from home with abdominal distention, leg swelling, and shortness of breath with exertion onset of symptoms several weeks ago.  Pt reported she had been unable to tolerate laying flat at night due to shortness of breath.  At baseline she uses a walker to ambulate, but has only been able to take a few steps due to symptoms outlined above.  She also endorsed a 15 lb weight gain from her baseline over a 3 week period.  She has been compliant with her outpatient torsemide  and metolazone , and she recently has taken a double dose of her torsemide  recently.  Due to worsening symptoms pt proceeded to the ER for evaluation.  All hx obtained from EDP note.  ED Course Upon arrival to the ER significant lab results were: Na+ 127/glucose 159/BUN 76/creatinine 2.81/phosphorous 4.7/total bilirubin 2.1/BNP 265.4/troponin 26.  CT Chest revealed borderline cardiomegaly with mild diffuse pericardial thickening vs. small volume fluid; esophagitis vs. reflux; and suspected liver cirrhosis.  CXR negative for acute cardiopulmonary disease.  She received 40 mg iv lasix .  She was subsequently admitted to the telemetry unit for additional workup and treatment by hospitalist team.  See detailed hospital course below under significant events.    Pertinent  Medical History  Permanent Atrial Fibrillation  HFpEF CKD stage III Type I Diabetes Mellitus  HLD HTN  Lymphedema  Morbid Obesity   Osteopenia Pericardial Effusion   Significant Hospital Events: Including procedures, antibiotic start and stop dates in addition to other pertinent events   05/30: Pt admitted to the telemetry unit with acute on chronic respiratory failure secondary to acute on chronic diastolic CHF  05/31: No significant ascites.  Lobular and heterogeneous appearance of the liver consistent        with history of cirrhosis.  Due to worsening renal failure lasix  held  05/31: Echo revealed 60 to 65%; echogenic material in the pericardium lateral to the RV apex that has been visualized on prior echo may represent fat pad vs. prior hemopericardium; small pericardial effusion without tamponade; mild mitral valve regurgitation; mild mitral stenosis; severe thickening of aortic valve  06/02: Right heart catheterization revealed severely elevated right atrial pressure, mild pulmonary hypertension, moderately elevated wedge pressure, and severely reduced cardiac output.  Pt transferred to ICU to start milrinone gtt, iv diuresis, and levophed gtt.  PCCM team consulted to assist with management.  06/03: Pt remains on levophed and milrinone gtts overnight UOP 1.3L  06/04: No significant events noted overnight.  Afebrile, remains on Milrinone (0.25) and Levophed (titrated down to 6 mcg), Coox is 66.4.  Creatinine slightly improved to 2.1 from 2.3, UOP 3.5 L last 24 hrs (net - 4.2 L) with aggressive diuresis.  Reports she was able to sleep with Tramadol  last night 06/05: No significant events noted overnight, afebrile. Remains on Milrinone and Levophed (7 mcg), Coox 63.9.  Creatinine improved to 1.8 from 2.1, UOP 2.2 L last 24 hrs (net - 5.9 L ) with diuresis, plan to continue per HF team.  Plan for Cardiac  MRI to look for infiltrative disease.  Consult PT/OT.  Interim History / Subjective:  As outlined above under Significant Hospital Events section  Objective    Blood pressure 96/68, pulse 100, temperature 97.9 F (36.6  C), temperature source Oral, resp. rate 18, height 5' (1.524 m), weight 70.3 kg, SpO2 95%. CVP:  [9 mmHg-26 mmHg] 13 mmHg      Intake/Output Summary (Last 24 hours) at 03/25/2024 0734 Last data filed at 03/25/2024 1610 Gross per 24 hour  Intake 542.57 ml  Output 2225 ml  Net -1682.43 ml   Filed Weights   03/23/24 0425 03/24/24 0720 03/25/24 0500  Weight: 69.9 kg 70.2 kg 70.3 kg    Examination: General: Chronically-ill appearing female, NAD on RA  HENT: Supple, no JVD  Lungs: Clear throughout, even, non labored  Cardiovascular: Irregular irregular, no r/g, 2+ radial/1+ distal pulses, 2+ bilateral lower extremity pitting edema  Abdomen: +BS x4, obese, soft, non tender, non distended  Extremities: Moves all extremities  Skin: No rashes or lesions present  Neuro: Alert and oriented, following commands, PERRLA  GU: Indwelling foley catheter draining yellow urine   Resolved problem list   Assessment and Plan   #Acute pain  #Insomnia  - Prn tylenol , tramadol , or oxycodone for pain management  - Prn Kpad for back pain  - Prn trazodone for insomnia  - Maintain sleep/wake cycle   #Acute Decompensated Biventricular Heart Failure; HFpEF; Predominately RV Failure #Cardiogenic Shock #Moderate to Severe Aortic Stenosis #Moderate Mitral Regurgitation #Mild pulmonary hypertension  #Permanent atrial fibrillation  #Small pericardial effusion  Hx: Pericardial effusion, HLD, and HTN  RHC 03/22/2024: showed severely decreased cardiac output, biventricular failure with RV dysfunction.  -Continuous cardiac monitoring -Maintain MAP >65 -Vasopressors as needed to maintain MAP goal -Advanced Heart Failure is following, appreciate input ~ continue Milrinone as per HF -Follow Coox & CVP -Trend lactic acid until normalized -Trend HS Troponin until peaked -Diuresis as BP and renal function permits per HF team ~ currently on IV Lasix  80 mg BID -Continue Eliquis  for [facility] -Plan for Cardiac MRI on  6/5 to look infiltrative disease  #Hyponatremia 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 in setting of Cardiorenal Syndrome ~ IMPROVING #Hypokalemia ~ RESOLVED -Monitor I&O's / urinary output -Follow BMP -Ensure adequate renal perfusion -Avoid nephrotoxic agents as able -Replace electrolytes as indicated ~ Pharmacy following for assistance with electrolyte replacement -Nephrology following, appreciate input  #Acute respiratory failure secondary to CHF exacerbation  -Supplemental O2 as needed to maintain O2 sats >92% -BiPAP if needed, wean as tolerated ~ not requiring -Follow intermittent Chest X-ray & ABG as needed -Bronchodilators prn -Diuresis as BP and renal function permits ~ currently on IV Lasix  80 mg BID -Pulmonary toilet as able  #H/o Cirrhosis  - Trend hepatic function panel  - Avoid hepatotoxic agents as able   #Type II diabetes mellitus  - CBG's ac/hs  - SSI and scheduled semglee   - Follow hypo/hyperglycemic protocol  - Target range 140 to 180    Best Practice (right click and "Reselect all SmartList Selections" daily)   Diet/type: Regular consistency (see orders) DVT prophylaxis Apixaban   Pressure ulcer(s): N/A GI prophylaxis: PPI Lines: Left upper arm PICC line and still needed  Foley: Yes and still needed  Code Status:  full code Last date of multidisciplinary goals of care discussion [03/25/2024]  06/05: Updated pt regarding pts condition and current plan of care.  All questions answered   Labs   CBC: Recent Labs  Lab 03/21/24 0954 03/22/24 0321 03/22/24 1118 03/23/24 0355 03/24/24 0427 03/25/24 0532  WBC 5.3 5.7  --  7.0 8.4 10.2  HGB 12.6 12.3 14.6  14.6 11.5* 12.0 11.5*  HCT 39.8 39.6 43.0  43.0 37.1 38.6 37.6  MCV 69.0* 68.9*  --  69.5* 69.2* 69.8*  PLT 298 269  --  312 283 289    Basic Metabolic Panel: Recent Labs  Lab 03/19/24 1038 03/19/24 2009 03/22/24 0321 03/22/24 0910 03/22/24 1118 03/23/24 0355 03/23/24 1446  03/24/24 0427 03/25/24 0532  NA 127*   < > 124* 126* 126*  127* 121* 125* 127* 127*  K 4.4   < > 2.7* 4.2 3.8  3.8 2.9* 4.1 3.4* 3.5  CL 90*   < > 85* 87*  --  82* 84* 85* 88*  CO2 23   < > 24 26  --  24 25 27 27   GLUCOSE 159*   < > 158* 140*  --  375* 208* 168* 239*  BUN 76*   < > 80* 76*  --  67* 69* 70* 71*  CREATININE 2.81*   < > 2.41* 2.43*  --  2.32* 2.37* 2.15* 1.85*  CALCIUM  9.5   < > 9.6 9.7  --  8.8* 9.4 9.2 8.9  MG 2.3  --  2.8*  --   --  2.3  --  2.4 2.3  PHOS 4.7*  --   --   --   --   --   --  3.3 3.0   < > = values in this interval not displayed.   GFR: Estimated Creatinine Clearance: 22.3 mL/min (A) (by C-G formula based on SCr of 1.85 mg/dL (H)). Recent Labs  Lab 03/22/24 0321 03/23/24 0355 03/24/24 0427 03/25/24 0532  WBC 5.7 7.0 8.4 10.2    Liver Function Tests: Recent Labs  Lab 03/19/24 1038 03/23/24 1446  AST 19 22  ALT 16 12  ALKPHOS 88 80  BILITOT 2.1* 3.0*  PROT 6.3* 6.4*  ALBUMIN  3.8 3.8   No results for input(s): "LIPASE", "AMYLASE" in the last 168 hours. No results for input(s): "AMMONIA" in the last 168 hours.  ABG    Component Value Date/Time   HCO3 26.9 03/22/2024 1118   HCO3 26.2 03/22/2024 1118   TCO2 28 03/22/2024 1118   TCO2 27 03/22/2024 1118   ACIDBASEDEF 8.7 (H) 04/11/2021 0210   O2SAT 63.9 03/25/2024 0532     Coagulation Profile: No results for input(s): "INR", "PROTIME" in the last 168 hours.  Cardiac Enzymes: No results for input(s): "CKTOTAL", "CKMB", "CKMBINDEX", "TROPONINI" in the last 168 hours.  HbA1C: Hemoglobin A1C  Date/Time Value Ref Range Status  06/28/2016 12:00 AM 6.6  Final   HB A1C (BAYER DCA - WAIVED)  Date/Time Value Ref Range Status  12/19/2023 04:24 PM 7.4 (H) 4.8 - 5.6 % Final    Comment:             Prediabetes: 5.7 - 6.4          Diabetes: >6.4          Glycemic control for adults with diabetes: <7.0   09/02/2023 01:35 PM 6.3 (H) 4.8 - 5.6 % Final    Comment:             Prediabetes:  5.7 - 6.4          Diabetes: >6.4          Glycemic control for adults with diabetes: <7.0  Hgb A1c MFr Bld  Date/Time Value Ref Range Status  03/24/2024 04:27 AM 7.6 (H) 4.8 - 5.6 % Final    Comment:    (NOTE) Diagnosis of Diabetes The following HbA1c ranges recommended by the American Diabetes Association (ADA) may be used as an aid in the diagnosis of diabetes mellitus.  Hemoglobin             Suggested A1C NGSP%              Diagnosis  <5.7                   Non Diabetic  5.7-6.4                Pre-Diabetic  >6.4                   Diabetic  <7.0                   Glycemic control for                       adults with diabetes.      CBG: Recent Labs  Lab 03/23/24 2126 03/24/24 0716 03/24/24 1137 03/24/24 1550 03/24/24 2223  GLUCAP 200* 175* 242* 213* 242*    Review of Systems: Positives in BOLD   Gen: back pain, fever, chills, weight change, fatigue, night sweats HEENT: Denies blurred vision, double vision, hearing loss, tinnitus, sinus congestion, rhinorrhea, sore throat, neck stiffness, dysphagia PULM: shortness of breath, cough, sputum production, hemoptysis, wheezing CV: chest pain, edema, orthopnea, paroxysmal nocturnal dyspnea, palpitations GI: Denies abdominal pain, nausea, vomiting, diarrhea, hematochezia, melena, constipation, change in bowel habits GU: Denies dysuria, hematuria, polyuria, oliguria, urethral discharge Endocrine: Denies hot or cold intolerance, polyuria, polyphagia or appetite change Derm: Denies rash, dry skin, scaling or peeling skin change Heme: Denies easy bruising, bleeding, bleeding gums Neuro: Denies headache, numbness, weakness, slurred speech, loss of memory or consciousness   Past Medical History:  She,  has a past medical history of A-fib (HCC), CHF (congestive heart failure) (HCC), Chronic heart failure with preserved ejection fraction (HFpEF) (HCC), 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   Surgical History:   Past Surgical History:  Procedure Laterality Date   BASAL CELL CARCINOMA EXCISION  11/01/2022   CATARACT EXTRACTION W/PHACO Left 10/09/2021   Procedure: CATARACT EXTRACTION PHACO AND INTRAOCULAR LENS PLACEMENT (IOC) LEFT DIABETIC 4.84 00:37.8;  Surgeon: Clair Crews, MD;  Location: [facility];  Service: Ophthalmology;  Laterality: Left;  Diabetic   CATARACT EXTRACTION W/PHACO Right 10/30/2021   Procedure: CATARACT EXTRACTION PHACO AND INTRAOCULAR LENS PLACEMENT (IOC) RIGHT DIABETIC 5.65 00:38.5;  Surgeon: Clair Crews, MD;  Location: [facility];  Service: Ophthalmology;  Laterality: Right;  Diabetic   CHOLECYSTECTOMY     DILATION AND CURETTAGE OF UTERUS     IR FLUORO GUIDE CV LINE RIGHT  08/16/2021   IR THORACENTESIS ASP PLEURAL SPACE W/IMG GUIDE  08/16/2021   PERICARDIOCENTESIS N/A 08/05/2021   Procedure: PERICARDIOCENTESIS;  Surgeon: Percival Brace, MD;  Location: [facility] INVAS[facility] CV LAB;  Service: Cardiovascular;  Laterality: N/A;   RIGHT HEART CATH N/A 03/22/2024   Procedure: RIGHT HEART CATH;  Surgeon: Wenona Hamilton, MD;  Location: [facility]AS[facility] CV [facility];  Service: Cardiovascular;  Laterality: N/A;   TEAR DUCT PROBING WITH STRABISMUS REPAIR Right    TONSILLECTOMY       Social History:   reports that she has never smoked. She has never used smokeless tobacco. She reports that she does not currently use alcohol. She reports that she does not use drugs.   Family History:  Her family history includes Breast cancer in her maternal grandmother and mother; Cancer in her brother and brother; Diabetes in her brother;  Heart disease in her brother and mother; Heart disease (age of onset: 59) in her father; Osteoporosis in her mother; Parkinson's disease in her brother.   Allergies No Known Allergies   Home Medications  Prior to Admission medications   Medication Sig Start Date End Date Taking? Authorizing Provider  allopurinol  (ZYLOPRIM ) 100 MG tablet Take 1 tablet (100 mg total) by mouth daily. 12/19/23  Yes Johnson, [name], DO  apixaban  (ELIQUIS ) 5 MG TABS tablet Take 1 tablet (5 mg total) by mouth 2 (two) times daily. 12/19/23  Yes Johnson, [name], DO  empagliflozin  (JARDIANCE ) 25 MG TABS tablet TAKE 1 TABLET BY MOUTH ONCE DAILY BEFOREBREAKFAST 12/19/23  Yes Johnson, [name], DO  folic acid  (FOLVITE ) 1 MG tablet Take 1 tablet (1 mg total) by mouth daily. 09/30/23  Yes Johnson, [name], DO  insulin  glargine, 2 Unit Dial, (TOUJEO  MAX SOLOSTAR) 300 UNIT/ML Solostar Pen Inject 16-20 Units into the skin daily. 12/19/23 09/14/24 Yes Johnson, [name], DO  latanoprost  (XALATAN ) 0.005 % ophthalmic solution Place 1 drop into both eyes at bedtime.   Yes [provider]  metolazone  (ZAROXOLYN ) 2.5 MG tablet Take 2.5 mg by mouth once a week. Wednesday   Yes [provider]  metoprolol  succinate (TOPROL -XL) 25 MG 24 hr tablet Take 0.5 tablets (12.5 mg total) by mouth daily. 12/19/23  Yes Johnson, [name], DO  midodrine  (PROAMATINE ) 10 MG tablet TAKE 1 TABLET BY MOUTH 3 TIMES DAILY WITH MEALS 02/24/23  Yes Gollan, Timothy J, MD  pantoprazole  (PROTONIX ) 40 MG tablet Take 1 tablet (40 mg total) by mouth 2 (two) times daily. 12/19/23  Yes Johnson, [name], DO  potassium chloride  SA (KLOR-CON  M) 20 MEQ tablet Take 2 tablets (40 mEq total) by mouth 2 (two) times daily. 12/19/23  Yes Johnson, [name], DO  rosuvastatin  (CRESTOR ) 5 MG tablet Take 1 tablet (5 mg total) by mouth once a week. 05/30/23  Yes Johnson, [name], DO  Semaglutide  (RYBELSUS ) 7 MG TABS Take 1 tablet (7 mg total) by mouth daily. 12/19/23  Yes Johnson,  [name], DO  spironolactone  (ALDACTONE ) 50 MG tablet Take 1 tablet (50 mg total) by mouth daily. 12/19/23  Yes Johnson, [name], DO  torsemide  (DEMADEX ) 20 MG tablet TAKE 1 TO 2 TABLETS BY MOUTH DAILY. ONLY TAKE 2 TABLETS FOR 1 TO 3 DAYS AND IF PERSISTS, CALL WITH BAD SWELLING 12/19/23  Yes Johnson, [name], DO  Continuous Glucose Sensor (DEXCOM G7 SENSOR) MISC by Does not apply route.    [provider]  Glucagon  (GVOKE HYPOPEN  2-PACK) 0.5 MG/0.1ML SOAJ Inject 1 each into the skin daily as needed. Patient not taking: Reported on 03/19/2024 03/14/23   Terre Ferri P, DO  Insulin  Pen Needle (PEN NEEDLES) 32G X 4 MM MISC 1 each by Does not apply route 3 (three) times daily. Patient not taking: Reported on 02/24/2024 01/30/23   Terre Ferri P, DO  Insulin  Pen Needle Cain Castillo  PEN NEEDLES) 32G X 4 MM MISC USE 1 PEN ONCE DAILY Patient not taking: Reported on 02/24/2024 10/11/22   Terre Ferri P, DO  Insulin  Syringe-Needle U-100 (INSULIN  SYRINGE 1CC/31GX5/16") 31G X 5/16" 1 ML MISC 1 each by Does not apply route 3 (three) times daily. Patient not taking: Reported on 02/24/2024 12/13/22   Terre Ferri P, DO  Insulin  Syringe/Needle U-500 (BD INSULIN  SYRINGE U-500) 31G X 0.5 ML MISC 1 each by Does not apply route 3 (three) times daily as needed. Patient not taking: Reported on 02/24/2024 03/14/23   Terre Ferri P, DO  metroNIDAZOLE (METROGEL) 1 % gel Apply to face every morning. Patient not taking: Reported on 03/19/2024 09/19/22   [provider]  NEEDLE, DISP, 30 G (BD DISP NEEDLES) 30G X 1/2" MISC by Does not apply route. Patient not taking: Reported on 02/24/2024    [provider]     Critical care time: 40 minutes     Cherylann Corpus, AGACNP-BC [facility] Prefer epic messenger for cross cover needs If after hours, please call E-link

## 2024-03-25 NOTE — TOC Initial Note (Signed)
 Transition of Care [facility]) - Initial/Assessment Note    Patient Details  Name: Karen Farley MRN: [id number] Date of Birth: 08-18-1946  Transition of Care [facility]) CM/SW Contact:    Laiba Fuerte A Zakiah Beckerman, RN Phone Number: 03/25/2024, 3:09 PM  Clinical Narrative:                 Chart reviewed.  Noted that patient was admitted with Acute on Chronic Diastolic heart failure.  Noted that patient is on Milrinone and Levophed drip.  Heart Failure team and PCCM following patient.    I have meet with patient and her husband at bedside today.  Karen Farley reported that prior to admission she lived at home with her husband.  She was able to bath and dress herself.  She reported that she was able to get around her home with a walker.    Karen Farley reports that her PCP is Terre Ferri  at [facility].  She reports that her medications are affordable.    I have informed Karen Farley that PT has recommended Home Health PT on discharge.  Karen Farley reports that she has used [facility] before and would like to use their services again.   I have asked [place]  with [facility] to accept  home health referral for RN, PT, and OT.    TOC will continue to follow for discharge planning.        Expected Discharge Plan: Home w Home Health Services Barriers to Discharge: Continued Medical Work up   Patient Goals and CMS Choice   CMS Medicare.gov Compare Post Acute Care list provided to:: Patient Choice offered to / list presented to : Patient      Expected Discharge Plan and Services   Discharge Planning Services: CM Consult Post Acute Care Choice: Home Health Living arrangements for the past 2 months: Single Family Home                 DME Arranged:  (walker with wheels, at home.  Patient reports that she has a seat the pulls out in the shower.)         HH Arranged: RN, PT, OT HH Agency: [facility] Date [facility] Agency Contacted: 03/25/24   Representative spoke with at [facility]  Agency: [place]   Prior Living Arrangements/Services Living arrangements for the past 2 months: Single Family Home Lives with:: Spouse Patient language and need for interpreter reviewed:: Yes Do you feel safe going back to the place where you live?: Yes        Care giver support system in place?: Yes (comment) (Patient has a supportive husband.)      Activities of Daily Living      Permission Sought/Granted                  Emotional Assessment Appearance:: Appears stated age Attitude/Demeanor/Rapport: Engaged Affect (typically observed): Appropriate Orientation: : Oriented to Self, Oriented to Place, Oriented to  Time, Oriented to Situation      Admission diagnosis:  Shortness of breath [R06.02] Acute on chronic diastolic heart failure (HCC) [I50.33] Acute on chronic diastolic CHF (congestive heart failure) (HCC) [I50.33] Patient Active Problem List   Diagnosis Date Noted   Hypotension 03/20/2024   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/20/2024   Acute on chronic diastolic heart failure (HCC) 03/19/2024   HTN (hypertension) 03/19/2024   Acute renal failure superimposed on stage 3b chronic kidney disease (HCC) 03/19/2024   Overweight (BMI 25.0-29.9) 03/19/2024   Myocardial injury  03/19/2024   Atrial fibrillation, chronic (HCC) 03/19/2024   Acute idiopathic gout involving toe 09/30/2023   Senile purpura (HCC) 12/13/2022   Pulmonary hypertension, primary (HCC) 09/07/2021   Pericardial effusion 08/05/2021   Acute blood loss anemia 08/04/2021   Anasarca    Stage 3b chronic kidney disease (HCC) 05/17/2021   Pleural effusion 05/17/2021   Acute on chronic diastolic CHF (congestive heart failure) (HCC) 05/17/2021   Chronic anticoagulation 05/17/2021   Acute kidney injury superimposed on CKD (HCC) 04/11/2021   Hyponatremia 04/11/2021   Acute on chronic heart failure with preserved ejection fraction (HFpEF) (HCC) 04/11/2021   Chronic venous insufficiency 08/14/2020    PAD (peripheral artery disease) (HCC) 08/14/2020   Bilateral primary osteoarthritis of knee 04/13/2020   Acquired spondylolisthesis 12/17/2019   Lumbar radiculopathy 12/17/2019   Atrial fibrillation (HCC) 11/20/2019   Closed fracture of radial styloid 08/06/2019   Displaced fracture of unspecified radial styloid process, initial encounter for closed fracture 08/06/2019   Controlled substance agreement signed 01/24/2018   Piriformis syndrome 07/01/2016   Sacroiliac joint dysfunction 01/09/2016   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 diabetes mellitus (HCC) 04/28/2015   Lymphedema 03/27/2015   Open-angle glaucoma, mild stage    Osteoarthritis of both knees    Type 2 diabetes mellitus with renal complication (HCC)    Osteopenia    Benign hypertensive renal disease    Hyperlipidemia    Degeneration of lumbosacral intervertebral disc    PCP:  Solomon Dupre, DO Pharmacy:   [facility], [place] - [address] [place] [address] Phone: [phone] Fax: [phone]  [facility] - [place], [place] - [address] [place] [address] Phone: [phone] Fax: [phone]  [id number] - [place], [place] - [address] [place] [address] Phone: [phone] Fax: [phone]     Social Drivers of Health (SDOH) Social History: SDOH Screenings   Food Insecurity: No Food Insecurity (03/19/2024)  Housing: Low Risk  (03/19/2024)  Transportation Needs: No Transportation Needs (03/19/2024)  Utilities: Not At Risk (03/19/2024)  Alcohol Screen: Low Risk  (07/23/2022)  Depression (PHQ2-9): Low Risk  (12/19/2023)  Financial Resource Strain: Low Risk  (07/23/2022)  Physical Activity: Sufficiently Active (07/23/2022)  Social Connections: Unknown (03/19/2024)  Stress: No Stress Concern Present (07/23/2022)  Tobacco Use: Low Risk  (03/19/2024)   SDOH Interventions:     Readmission Risk Interventions     No data to display

## 2024-03-25 NOTE — Plan of Care (Signed)
  Problem: Coping: Goal: Ability to adjust to condition or change in health will improve Outcome: Progressing   Problem: Fluid Volume: Goal: Ability to maintain a balanced intake and output will improve Outcome: Progressing   Problem: Skin Integrity: Goal: Risk for impaired skin integrity will decrease Outcome: Progressing   Problem: Education: Goal: Knowledge of General Education information will improve Description: Including pain rating scale, medication(s)/side effects and non-pharmacologic comfort measures Outcome: Progressing   Problem: Clinical Measurements: Goal: Diagnostic test results will improve Outcome: Progressing Goal: Cardiovascular complication will be avoided Outcome: Progressing

## 2024-03-25 NOTE — Progress Notes (Signed)
 Iron  stores low.   Please hold off on IV iron  until cardiac MRI is complete (will interfere with study)

## 2024-03-25 NOTE — Progress Notes (Signed)
 Advanced Heart Failure Rounding Note   Subjective:    Remains on NE 7 and milrinone 0.25  Diuresing well but CVP still 15. Scr improving  Feels tired. Denies orthopnea or PND.   Objective:   Weight Range:  Vital Signs:   Temp:  [97.9 F (36.6 C)-99.1 F (37.3 C)] 97.9 F (36.6 C) (06/05 0400) Pulse Rate:  [81-118] 97 (06/05 0800) Resp:  [11-25] 18 (06/05 0800) BP: (64-122)/(40-107) 98/64 (06/05 0800) SpO2:  [93 %-100 %] 97 % (06/05 0800) Weight:  [70.3 kg] 70.3 kg (06/05 0500) Last BM Date :  (PTA)  Weight change: Filed Weights   03/23/24 0425 03/24/24 0720 03/25/24 0500  Weight: 69.9 kg 70.2 kg 70.3 kg    Intake/Output:   Intake/Output Summary (Last 24 hours) at 03/25/2024 0901 Last data filed at 03/25/2024 0800 Gross per 24 hour  Intake 523.23 ml  Output 2675 ml  Net -2151.77 ml     Physical Exam: General:  Elderly No resp difficulty HEENT: normal Neck: supple. Jvp to ear Cor: irreg tachy 2/6 AS Lungs: clear Abdomen: soft, nontender, nondistended. No hepatosplenomegaly. No bruits or masses. Good bowel sounds. Extremities: no cyanosis, clubbing, rash, 1+ edema Neuro: alert & orientedx3, cranial nerves grossly intact. moves all 4 extremities w/o difficulty. Affect pleasant   Telemetry: AF 90-110 Personally reviewed  Labs: Basic Metabolic Panel: Recent Labs  Lab 03/19/24 1038 03/19/24 2009 03/22/24 0321 03/22/24 0910 03/22/24 1118 03/23/24 0355 03/23/24 1446 03/24/24 0427 03/25/24 0532  NA 127*   < > 124* 126* 126*  127* 121* 125* 127* 127*  K 4.4   < > 2.7* 4.2 3.8  3.8 2.9* 4.1 3.4* 3.5  CL 90*   < > 85* 87*  --  82* 84* 85* 88*  CO2 23   < > 24 26  --  24 25 27 27   GLUCOSE 159*   < > 158* 140*  --  375* 208* 168* 239*  BUN 76*   < > 80* 76*  --  67* 69* 70* 71*  CREATININE 2.81*   < > 2.41* 2.43*  --  2.32* 2.37* 2.15* 1.85*  CALCIUM  9.5   < > 9.6 9.7  --  8.8* 9.4 9.2 8.9  MG 2.3  --  2.8*  --   --  2.3  --  2.4 2.3  PHOS 4.7*  --    --   --   --   --   --  3.3 3.0   < > = values in this interval not displayed.    Liver Function Tests: Recent Labs  Lab 03/19/24 1038 03/23/24 1446  AST 19 22  ALT 16 12  ALKPHOS 88 80  BILITOT 2.1* 3.0*  PROT 6.3* 6.4*  ALBUMIN  3.8 3.8   No results for input(s): "LIPASE", "AMYLASE" in the last 168 hours. No results for input(s): "AMMONIA" in the last 168 hours.  CBC: Recent Labs  Lab 03/21/24 0954 03/22/24 0321 03/22/24 1118 03/23/24 0355 03/24/24 0427 03/25/24 0532  WBC 5.3 5.7  --  7.0 8.4 10.2  HGB 12.6 12.3 14.6  14.6 11.5* 12.0 11.5*  HCT 39.8 39.6 43.0  43.0 37.1 38.6 37.6  MCV 69.0* 68.9*  --  69.5* 69.2* 69.8*  PLT 298 269  --  312 283 289    Cardiac Enzymes: No results for input(s): "CKTOTAL", "CKMB", "CKMBINDEX", "TROPONINI" in the last 168 hours.  BNP: BNP (last 3 results) Recent Labs    12/19/23 1624 03/19/24  1038  BNP 132.4* 265.4*    ProBNP (last 3 results) No results for input(s): "PROBNP" in the last 8760 hours.    Other results:  Imaging: No results found.    Medications:     Scheduled Medications:  allopurinol   100 mg Oral Daily   apixaban   5 mg Oral BID   Chlorhexidine  Gluconate Cloth  6 each Topical Daily   docusate sodium   100 mg Oral BID   feeding supplement  237 mL Oral BID BM   folic acid   1 mg Oral Daily   furosemide   80 mg Intravenous BID   insulin  aspart  0-15 Units Subcutaneous TID WC   insulin  aspart  0-5 Units Subcutaneous QHS   insulin  glargine-yfgn  10 Units Subcutaneous BID   latanoprost   1 drop Both Eyes QHS   metolazone   2.5 mg Oral Once   midodrine   10 mg Oral Q8H   pantoprazole   40 mg Oral BID   polyethylene glycol  17 g Oral Daily   potassium chloride   40 mEq Oral Q4H   potassium chloride   40 mEq Oral Once   [START ON 03/26/2024] rosuvastatin   5 mg Oral Weekly   senna  1 tablet Oral Daily   sodium chloride  flush  10-40 mL Intracatheter Q12H    Infusions:  milrinone 0.25 mcg/kg/min (03/25/24  0728)   norepinephrine (LEVOPHED) Adult infusion 7 mcg/min (03/25/24 0728)    PRN Medications: acetaminophen , albuterol , bisacodyl , dextromethorphan-guaiFENesin , ondansetron  (ZOFRAN ) IV, mouth rinse, oxyCODONE, sodium chloride  flush, traMADol , traZODone   Assessment/Plan:   1. Acute on Chronic Biventricular Heart Failure; HFpEF; Predominantly RV Failure - EF 60-65% with mod-severe AS normal RV on echo read, however RHC with low output biventricular heart failure - RHC 03/22/24: RA 18, PA 38/22 (30), PCW 26, CO/CI 3.06/1.85, PAPi 0.89 - on NE 7 and milrinone 0.25 and midodrine  10 tid. Co-ox 64% CVP 15 - Diuresing well but still fluid overloaded. Continue IV lasix . 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   - Agree with short-course of inotropic/pressor support to see how well we can optimize her but suspect long-term options are quite limited. I discussed this again with her and also spoke with her brother at bedside.  - Once fully diuresed will attempt wean of NE/milrinone. On midodrine  to facilitate - Will get cMRI today  to look for infiltrative disease  2. AKI on CKD 3b:  - baseline Cr ~2.0. Up to 2.8 on admission - improving with diuresis and inotrope/pressor support. Scr 1.85 today - nephrology following, appreciate recs  3. Moderate to severe AS/moderate MR - follow   4. Small pericardial Effusion - h/o pericardial effusion s/p pericardiocentesis 700 ml in 07/2021. Bloody, neg for malignancy. - small w/o tamponade on echo - no change   5. Atrial Fibrillation: Permanent - hold ? blocker with milrinone and low output - continue Eliquis  5 mg bid - rate a bit faster today off b-blocker   7. Hyponatremia - NA 127. improved with tolvaptan 6/3 - restrict FW   8.Hypokalemia - K 3.5. supp  -0 Can add back spiro as BP toelrates -  continue replacement for goal K>4   9. H/o cirrhosis - s/p paracentesis in 2022 - CT chest with suspected liver cirrhosis - suspect this was/is in the setting of cardiohepatic syndrome - AST/ALT nl on admit  10. Iron   deficiency anemia - will give IV iron  after cMRI  11. Debility - Consult PT/OT - OOB to chair   CRITICAL CARE Performed by: Jules Oar  Total critical care time: 36 minutes  Critical care time was exclusive of separately billable procedures and treating other patients.  Critical care was necessary to treat or prevent imminent or life-threatening deterioration.  Critical care was time spent personally by me (independent of midlevel providers or residents) on the following activities: development of treatment plan with patient and/or surrogate as well as nursing, discussions with consultants, evaluation of patient's response to treatment, examination of patient, obtaining history from patient or surrogate, ordering and performing treatments and interventions, ordering and review of laboratory studies, ordering and review of radiographic studies, pulse oximetry and re-evaluation of patient's condition.   Length of Stay: 6   Jules Oar MD 03/25/2024, 9:01 AM  Advanced Heart Failure Team Pager [phone] (M-F; 7a - 4p)  Please contact [facility] for night-coverage after hours (4p -7a ) and weekends on [email or web address]

## 2024-03-26 DIAGNOSIS — I5033 Acute on chronic diastolic (congestive) heart failure: Secondary | ICD-10-CM | POA: Diagnosis not present

## 2024-03-26 DIAGNOSIS — I4891 Unspecified atrial fibrillation: Secondary | ICD-10-CM | POA: Diagnosis not present

## 2024-03-26 DIAGNOSIS — I272 Pulmonary hypertension, unspecified: Secondary | ICD-10-CM | POA: Diagnosis not present

## 2024-03-26 DIAGNOSIS — I3139 Other pericardial effusion (noninflammatory): Secondary | ICD-10-CM | POA: Diagnosis not present

## 2024-03-26 LAB — BASIC METABOLIC PANEL WITH GFR
Anion gap: 11 (ref 5–15)
BUN: 72 mg/dL — ABNORMAL HIGH (ref 8–23)
CO2: 30 mmol/L (ref 22–32)
Calcium: 9.4 mg/dL (ref 8.9–10.3)
Chloride: 87 mmol/L — ABNORMAL LOW (ref 98–111)
Creatinine, Ser: 1.89 mg/dL — ABNORMAL HIGH (ref 0.44–1.00)
GFR, Estimated: 27 mL/min — ABNORMAL LOW (ref >60)
Glucose, Bld: 168 mg/dL — ABNORMAL HIGH (ref 70–99)
Potassium: 4 mmol/L (ref 3.5–5.1)
Sodium: 128 mmol/L — ABNORMAL LOW (ref 135–145)

## 2024-03-26 LAB — COOXEMETRY PANEL
Carboxyhemoglobin: 2.2 % — ABNORMAL HIGH (ref 0.5–1.5)
Carboxyhemoglobin: 2.2 % — ABNORMAL HIGH (ref 0.5–1.5)
Methemoglobin: 0.7 % (ref 0.0–1.5)
Methemoglobin: <0.7 % (ref 0.0–1.5)
O2 Saturation: 92.3 %
O2 Saturation: 65.2 %
Total hemoglobin: 12 g/dL (ref 12.0–16.0)
Total hemoglobin: 12.1 g/dL (ref 12.0–16.0)
Total oxygen content: 89.6 %
Total oxygen content: 63.4 %

## 2024-03-26 LAB — GLUCOSE, CAPILLARY
Glucose-Capillary: 193 mg/dL — ABNORMAL HIGH (ref 70–99)
Glucose-Capillary: 220 mg/dL — ABNORMAL HIGH (ref 70–99)
Glucose-Capillary: 161 mg/dL — ABNORMAL HIGH (ref 70–99)
Glucose-Capillary: 151 mg/dL — ABNORMAL HIGH (ref 70–99)

## 2024-03-26 LAB — CBC
HCT: 38 % (ref 36.0–46.0)
Hemoglobin: 11.4 g/dL — ABNORMAL LOW (ref 12.0–15.0)
MCH: 21.1 pg — ABNORMAL LOW (ref 26.0–34.0)
MCHC: 30 g/dL (ref 30.0–36.0)
MCV: 70.2 fL — ABNORMAL LOW (ref 80.0–100.0)
Platelets: 313 10*3/uL (ref 150–400)
RBC: 5.41 MIL/uL — ABNORMAL HIGH (ref 3.87–5.11)
RDW: 20.5 % — ABNORMAL HIGH (ref 11.5–15.5)
WBC: 9.9 10*3/uL (ref 4.0–10.5)
nRBC: 0 % (ref 0.0–0.2)

## 2024-03-26 LAB — MAGNESIUM: Magnesium: 2.6 mg/dL — ABNORMAL HIGH (ref 1.7–2.4)

## 2024-03-26 MED ORDER — SENNA 8.6 MG PO TABS
1.0000 | ORAL_TABLET | Freq: Every day | ORAL | Status: DC
  Administered 2024-03-26 – 2024-04-03 (×9): 8.6 mg via ORAL
  Filled 2024-03-26 (×9): qty 1

## 2024-03-26 MED ORDER — LACTULOSE 10 GM/15ML PO SOLN
30.0000 g | Freq: Once | ORAL | Status: AC
  Administered 2024-03-26: 30 g via ORAL
  Filled 2024-03-26: qty 60

## 2024-03-26 MED ORDER — IRON SUCROSE 300 MG IVPB - SIMPLE MED
300.0000 mg | Freq: Once | Status: AC
  Administered 2024-03-26: 300 mg via INTRAVENOUS
  Filled 2024-03-26: qty 300

## 2024-03-26 MED ORDER — LACTULOSE 10 GM/15ML PO SOLN: 20.0000 g | Freq: Once | ORAL | Status: DC

## 2024-03-26 NOTE — Progress Notes (Signed)
 PT Cancellation Note  Patient Details Name: Karen Farley MRN: [id number] DOB: January 28, 1946   Cancelled Treatment:    Reason Eval/Treat Not Completed: Medical issues which prohibited therapy Per RN, patient with soft BP this AM and still on pressors with recent dose of Lasix  given. RN request to hold at this time. PT will re-attempt at later date/time.   [name], PT, DPT Physical Therapist - [facility]  [facility]    Delvis Kau A Kimberlye Dilger 03/26/2024, 10:37 AM

## 2024-03-26 NOTE — Progress Notes (Signed)
 OT Cancellation Note  Patient Details Name: Karen Farley MRN: [id number] DOB: 1946/06/06   Cancelled Treatment:    Reason Eval/Treat Not Completed: Other (comment). Per PT, RN requested therapies to hold this AM d/t soft BP and being on pressors. Checked back in this afternoon and pt had just returned to bed with assist from nursing after toileting tasks. She politely declined further activity this time even with encouragement d/t increased fatigue after reports of being on toilet 45 mins attempting BM. Will check back at later date/time.  Zeppelin Beckstrand E Jaquitta Dupriest 03/26/2024, 2:52 PM

## 2024-03-26 NOTE — Progress Notes (Signed)
 Order received to discontinue foley catheter and updated patient.  Per patient request to have foley removed after Lasix  dose has been given and effective.

## 2024-03-26 NOTE — Progress Notes (Signed)
 NAME:  Karen Farley, MRN:  [id number], DOB:  Dec 19, 1945, LOS: 7 ADMISSION DATE:  03/19/2024, CONSULTATION DATE: 03/22/2024 REFERRING MD: Dr. Alvenia Aus, CHIEF COMPLAINT: Shortness of Breath   History of Present Illness:  This is a 78 yo female who presented to [facility] ER on 05/30 from home with abdominal distention, leg swelling, and shortness of breath with exertion onset of symptoms several weeks ago.  Pt reported she had been unable to tolerate laying flat at night due to shortness of breath.  At baseline she uses a walker to ambulate, but has only been able to take a few steps due to symptoms outlined above.  She also endorsed a 15 lb weight gain from her baseline over a 3 week period.  She has been compliant with her outpatient torsemide  and metolazone , and she recently has taken a double dose of her torsemide  recently.  Due to worsening symptoms pt proceeded to the ER for evaluation.  All hx obtained from EDP note.  ED Course Upon arrival to the ER significant lab results were: Na+ 127/glucose 159/BUN 76/creatinine 2.81/phosphorous 4.7/total bilirubin 2.1/BNP 265.4/troponin 26.  CT Chest revealed borderline cardiomegaly with mild diffuse pericardial thickening vs. small volume fluid; esophagitis vs. reflux; and suspected liver cirrhosis.  CXR negative for acute cardiopulmonary disease.  She received 40 mg iv lasix .  She was subsequently admitted to the telemetry unit for additional workup and treatment by hospitalist team.  See detailed hospital course below under significant events.    Pertinent  Medical History  Permanent Atrial Fibrillation  HFpEF CKD stage III Type I Diabetes Mellitus  HLD HTN  Lymphedema  Morbid Obesity  Osteopenia Pericardial Effusion   Significant Hospital Events: Including procedures, antibiotic start and stop dates in addition to other pertinent events   05/30: Pt admitted to the telemetry unit with acute on chronic respiratory failure secondary to acute on  chronic diastolic CHF  05/31: No significant ascites.  Lobular and heterogeneous appearance of the liver consistent        with history of cirrhosis.  Due to worsening renal failure lasix  held  05/31: Echo revealed 60 to 65%; echogenic material in the pericardium lateral to the RV apex that has been visualized on prior echo may represent fat pad vs. prior hemopericardium; small pericardial effusion without tamponade; mild mitral valve regurgitation; mild mitral stenosis; severe thickening of aortic valve  06/02: Right heart catheterization revealed severely elevated right atrial pressure, mild pulmonary hypertension, moderately elevated wedge pressure, and severely reduced cardiac output.  Pt transferred to ICU to start milrinone gtt, iv diuresis, and levophed gtt.  PCCM team consulted to assist with management.  06/03: Pt remains on levophed and milrinone gtts overnight UOP 1.3L  06/04: No significant events noted overnight.  Afebrile, remains on Milrinone (0.25) and Levophed (titrated down to 6 mcg), Coox is 66.4.  Creatinine slightly improved to 2.1 from 2.3, UOP 3.5 L last 24 hrs (net - 4.2 L) with aggressive diuresis.  Reports she was able to sleep with Tramadol  last night 06/05: No significant events noted overnight, afebrile. Remains on Milrinone and Levophed (7 mcg), Coox 63.9.  Creatinine improved to 1.8 from 2.1, UOP 2.2 L last 24 hrs (net - 5.9 L ) with diuresis, plan to continue per HF team.  Plan for Cardiac MRI to look for infiltrative disease.  Consult PT/OT 06/06: Pt remains on milrinone and levophed gtt @6  mcg/min   Interim History / Subjective:  Pts only complaint is constipation   Objective  Blood pressure 91/60, pulse (!) 105, temperature 98.6 F (37 C), temperature source Oral, resp. rate 20, height 5' (1.524 m), weight 70.3 kg, SpO2 94%. CVP:  [8 mmHg-15 mmHg] 9 mmHg      Intake/Output Summary (Last 24 hours) at 03/26/2024 0747 Last data filed at 03/26/2024 0100 Gross per 24  hour  Intake 353.94 ml  Output 2650 ml  Net -2296.06 ml   Filed Weights   03/24/24 0720 03/25/24 0500 03/26/24 0434  Weight: 70.2 kg 70.3 kg 70.3 kg    Examination: General: Chronically-ill appearing female, NAD on RA  HENT: Supple, no JVD  Lungs: Clear throughout, even, non labored  Cardiovascular: Irregular irregular, no r/g, 2+ radial/1+ distal pulses, 1+ bilateral lower extremity edema  Abdomen: +BS x4, obese, soft, non tender, non distended  Extremities: Moves all extremities  Skin: No rashes or lesions present  Neuro: Alert and oriented, following commands, PERRLA  GU: Indwelling foley catheter draining yellow urine   Resolved problem list   Assessment and Plan   #Acute chronic diastolic CHF  #Mild pulmonary hypertension  #Permanent atrial fibrillation  #Small pericardial effusion  #Mitral regurgitation  #Cardiorenal syndrome  Hx: Pericardial effusion, HLD, and HTN  RHC 03/22/2024: showed severely decreased cardiac output, biventricular failure with RV dysfunction.  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  #Hypokalemia  - Trend BMP  - Replete electrolytes as indicated  - Strict I&O's - Avoid nephrotoxic agents as able  - Nephrology consulted appreciate input   #Acute respiratory failure secondary to CHF exacerbation  - Supplemental O2 for dyspnea and/or hypoxia  - Maintain O2 sats 92% or higher  - Prn bronchodilator therapy   #Constipation  - Continue aggressive bowel regimen   #H/o Cirrhosis  - Trend hepatic function panel  - Avoid hepatotoxic agents as able   #Type II diabetes mellitus  - CBG's ac/hs  - SSI, scheduled novolog , and scheduled semglee   - Follow  hypo/hyperglycemic protocol  - Target range 140 to 180   Best Practice (right click and "Reselect all SmartList Selections" daily)  Diet/type: Regular consistency (see orders) DVT prophylaxis Apixaban   Pressure ulcer(s): N/A GI prophylaxis: PPI Lines: Yes and still needed  Foley: Yes and still needed  Code Status:  full code Last date of multidisciplinary goals of care discussion [03/26/2024]  06/06: Updated pt at bedside regarding plan of care.   Labs   CBC: Recent Labs  Lab 03/22/24 0321 03/22/24 1118 03/23/24 0355 03/24/24 0427 03/25/24 0532 03/26/24 0509  WBC 5.7  --  7.0 8.4 10.2 9.9  HGB 12.3 14.6  14.6 11.5* 12.0 11.5* 11.4*  HCT 39.6 43.0  43.0 37.1 38.6 37.6 38.0  MCV 68.9*  --  69.5* 69.2* 69.8* 70.2*  PLT 269  --  312 283 289 313    Basic Metabolic Panel: Recent Labs  Lab 03/19/24 1038 03/19/24 2009 03/22/24 0321 03/22/24 0910 03/23/24 0355 03/23/24 1446 03/24/24 0427 03/25/24 0532 03/25/24 2232 03/26/24 0509  NA 127*   < > 124*   < > 121* 125* 127* 127* 124* 128*  K 4.4   < > 2.7*   < > 2.9* 4.1 3.4* 3.5 3.7 4.0  CL 90*   < >  85*   < > 82* 84* 85* 88* 82* 87*  CO2 23   < > 24   < > 24 25 27 27 28 30   GLUCOSE 159*   < > 158*   < > 375* 208* 168* 239* 235* 168*  BUN 76*   < > 80*   < > 67* 69* 70* 71* 70* 72*  CREATININE 2.81*   < > 2.41*   < > 2.32* 2.37* 2.15* 1.85* 1.87* 1.89*  CALCIUM  9.5   < > 9.6   < > 8.8* 9.4 9.2 8.9 9.1 9.4  MG 2.3  --  2.8*  --  2.3  --  2.4 2.3  --  2.6*  PHOS 4.7*  --   --   --   --   --  3.3 3.0  --   --    < > = values in this interval not displayed.   GFR: Estimated Creatinine Clearance: 21.8 mL/min (A) (by C-G formula based on SCr of 1.89 mg/dL (H)). Recent Labs  Lab 03/23/24 0355 03/24/24 0427 03/25/24 0532 03/26/24 0509  WBC 7.0 8.4 10.2 9.9    Liver Function Tests: Recent Labs  Lab 03/19/24 1038 03/23/24 1446  AST 19 22  ALT 16 12  ALKPHOS 88 80  BILITOT 2.1* 3.0*  PROT 6.3* 6.4*  ALBUMIN  3.8  3.8   No results for input(s): "LIPASE", "AMYLASE" in the last 168 hours. No results for input(s): "AMMONIA" in the last 168 hours.  ABG    Component Value Date/Time   HCO3 26.9 03/22/2024 1118   HCO3 26.2 03/22/2024 1118   TCO2 28 03/22/2024 1118   TCO2 27 03/22/2024 1118   ACIDBASEDEF 8.7 (H) 04/11/2021 0210   O2SAT 92.3 03/26/2024 0509     Coagulation Profile: No results for input(s): "INR", "PROTIME" in the last 168 hours.  Cardiac Enzymes: No results for input(s): "CKTOTAL", "CKMB", "CKMBINDEX", "TROPONINI" in the last 168 hours.  HbA1C: Hemoglobin A1C  Date/Time Value Ref Range Status  06/28/2016 12:00 AM 6.6  Final   HB A1C (BAYER DCA - WAIVED)  Date/Time Value Ref Range Status  12/19/2023 04:24 PM 7.4 (H) 4.8 - 5.6 % Final    Comment:             Prediabetes: 5.7 - 6.4          Diabetes: >6.4          Glycemic control for adults with diabetes: <7.0   09/02/2023 01:35 PM 6.3 (H) 4.8 - 5.6 % Final    Comment:             Prediabetes: 5.7 - 6.4          Diabetes: >6.4          Glycemic control for adults with diabetes: <7.0    Hgb A1c MFr Bld  Date/Time Value Ref Range Status  03/24/2024 04:27 AM 7.6 (H) 4.8 - 5.6 % Final    Comment:    (NOTE) Diagnosis of Diabetes The following HbA1c ranges recommended by the American Diabetes Association (ADA) may be used as an aid in the diagnosis of diabetes mellitus.  Hemoglobin             Suggested A1C NGSP%              Diagnosis  <5.7                   Non Diabetic  5.7-6.4  Pre-Diabetic  >6.4                   Diabetic  <7.0                   Glycemic control for                       adults with diabetes.      CBG: Recent Labs  Lab 03/25/24 0812 03/25/24 1218 03/25/24 1715 03/25/24 2127 03/26/24 0736  GLUCAP 184* 292* 246* 153* 193*    Review of Systems: Positives in BOLD   Gen: Denies fever, chills, weight change, fatigue, night sweats HEENT: Denies blurred vision, double  vision, hearing loss, tinnitus, sinus congestion, rhinorrhea, sore throat, neck stiffness, dysphagia PULM: shortness of breath with exertion, cough, sputum production, hemoptysis, wheezing CV: chest pain, edema, orthopnea, paroxysmal nocturnal dyspnea, palpitations GI: Denies abdominal pain, nausea, vomiting, diarrhea, hematochezia, melena, constipation, change in bowel habits GU: Denies dysuria, hematuria, polyuria, oliguria, urethral discharge Endocrine: Denies hot or cold intolerance, polyuria, polyphagia or appetite change Derm: Denies rash, dry skin, scaling or peeling skin change Heme: Denies easy bruising, bleeding, bleeding gums Neuro: Denies headache, numbness, weakness, slurred speech, loss of memory or consciousness  Past Medical History:  She,  has a past medical history of A-fib (HCC), CHF (congestive heart failure) (HCC), Chronic heart failure with preserved ejection fraction (HFpEF) (HCC), 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   Surgical History:   Past Surgical History:  Procedure Laterality Date   BASAL CELL CARCINOMA EXCISION  11/01/2022   CATARACT EXTRACTION W/PHACO Left 10/09/2021   Procedure: CATARACT EXTRACTION PHACO AND INTRAOCULAR LENS PLACEMENT (IOC) LEFT DIABETIC 4.84 00:37.8;  Surgeon: Clair Crews, MD;  Location: [facility];  Service: Ophthalmology;  Laterality: Left;  Diabetic   CATARACT EXTRACTION W/PHACO Right 10/30/2021   Procedure: CATARACT EXTRACTION PHACO AND INTRAOCULAR LENS PLACEMENT (IOC) RIGHT DIABETIC 5.65 00:38.5;  Surgeon: Clair Crews, MD;  Location: [facility];  Service:  Ophthalmology;  Laterality: Right;  Diabetic   CHOLECYSTECTOMY     DILATION AND CURETTAGE OF UTERUS     IR FLUORO GUIDE CV LINE RIGHT  08/16/2021   IR THORACENTESIS ASP PLEURAL SPACE W/IMG GUIDE  08/16/2021   PERICARDIOCENTESIS N/A 08/05/2021   Procedure: PERICARDIOCENTESIS;  Surgeon: Percival Brace, MD;  Location: [facility];  Service: Cardiovascular;  Laterality: N/A;   RIGHT HEART CATH N/A 03/22/2024   Procedure: RIGHT HEART CATH;  Surgeon: Wenona Hamilton, MD;  Location: [facility];  Service: Cardiovascular;  Laterality: N/A;   TEAR DUCT PROBING WITH STRABISMUS REPAIR Right    TONSILLECTOMY       Social History:   reports that she has never smoked. She has never used smokeless tobacco. She reports that she does not currently use alcohol. She reports that she does not use drugs.   Family History:  Her family history includes Breast cancer in her maternal grandmother and mother; Cancer in her brother and brother; Diabetes in her brother; Heart disease in her brother and mother; Heart disease (age of onset: 17) in her father; Osteoporosis in her mother; Parkinson's disease in her brother.   Allergies No Known Allergies  Home Medications  Prior to Admission medications   Medication Sig Start Date End Date Taking? Authorizing Provider  allopurinol  (ZYLOPRIM ) 100 MG tablet Take 1 tablet (100 mg total) by mouth daily. 12/19/23  Yes Johnson, [name], DO  apixaban  (ELIQUIS ) 5 MG TABS tablet Take 1 tablet (5 mg total) by mouth 2 (two) times daily. 12/19/23  Yes Johnson, [name], DO  empagliflozin  (JARDIANCE ) 25 MG TABS tablet TAKE 1 TABLET BY MOUTH ONCE DAILY BEFOREBREAKFAST 12/19/23  Yes Johnson, [name], DO  folic acid  (FOLVITE ) 1 MG tablet Take 1 tablet (1 mg total) by mouth daily. 09/30/23  Yes Johnson, [name], DO  insulin  glargine, 2 Unit Dial, (TOUJEO  MAX SOLOSTAR) 300 UNIT/ML Solostar Pen Inject 16-20 Units into the skin daily. 12/19/23 09/14/24 Yes Johnson,  [name], DO  latanoprost  (XALATAN ) 0.005 % ophthalmic solution Place 1 drop into both eyes at bedtime.   Yes [provider]  metolazone  (ZAROXOLYN ) 2.5 MG tablet Take 2.5 mg by mouth once a week. Wednesday   Yes [provider]  metoprolol  succinate (TOPROL -XL) 25 MG 24 hr tablet Take 0.5 tablets (12.5 mg total) by mouth daily. 12/19/23  Yes Johnson, [name], DO  midodrine  (PROAMATINE ) 10 MG tablet TAKE 1 TABLET BY MOUTH 3 TIMES DAILY WITH MEALS 02/24/23  Yes Gollan, Timothy J, MD  pantoprazole  (PROTONIX ) 40 MG tablet Take 1 tablet (40 mg total) by mouth 2 (two) times daily. 12/19/23  Yes Johnson, [name], DO  potassium chloride  SA (KLOR-CON  M) 20 MEQ tablet Take 2 tablets (40 mEq total) by mouth 2 (two) times daily. 12/19/23  Yes Johnson, [name], DO  rosuvastatin  (CRESTOR ) 5 MG tablet Take 1 tablet (5 mg total) by mouth once a week. 05/30/23  Yes Johnson, [name], DO  Semaglutide  (RYBELSUS ) 7 MG TABS Take 1 tablet (7 mg total) by mouth daily. 12/19/23  Yes Johnson, [name], DO  spironolactone  (ALDACTONE ) 50 MG tablet Take 1 tablet (50 mg total) by mouth daily. 12/19/23  Yes Johnson, [name], DO  torsemide  (DEMADEX ) 20 MG tablet TAKE 1 TO 2 TABLETS BY MOUTH DAILY. ONLY TAKE 2 TABLETS FOR 1 TO 3 DAYS AND IF PERSISTS, CALL WITH BAD SWELLING 12/19/23  Yes Johnson, [name], DO  Continuous Glucose Sensor (DEXCOM G7 SENSOR) MISC by Does not apply route.    [provider]  Glucagon  (GVOKE HYPOPEN  2-PACK) 0.5 MG/0.1ML SOAJ Inject 1 each into the skin daily as needed. Patient not taking: Reported on 03/19/2024 03/14/23   Terre Ferri P, DO  Insulin  Pen Needle (PEN NEEDLES) 32G X 4 MM MISC 1 each by Does not apply route 3 (three) times daily. Patient not taking: Reported on 02/24/2024 01/30/23   Terre Ferri P, DO  Insulin  Pen Needle (ULTRACARE PEN NEEDLES) 32G X 4 MM MISC USE 1 PEN ONCE DAILY Patient not taking: Reported on 02/24/2024 10/11/22   Terre Ferri P, DO  Insulin  Syringe-Needle  U-100 (INSULIN  SYRINGE 1CC/31GX5/16") 31G X 5/16" 1 ML MISC 1 each by Does not apply route 3 (three) times daily. Patient not taking: Reported on 02/24/2024 12/13/22   Terre Ferri P, DO  Insulin  Syringe/Needle U-500 (BD INSULIN  SYRINGE U-500) 31G X 0.5 ML MISC 1 each by Does not apply route 3 (three) times daily as needed. Patient not taking: Reported on 02/24/2024 03/14/23   Terre Ferri P, DO  metroNIDAZOLE (METROGEL) 1 % gel Apply to face every morning. Patient not taking: Reported on 03/19/2024 09/19/22   [provider]  NEEDLE, DISP, 30 G (BD DISP NEEDLES) 30G X 1/2" MISC by Does not apply route. Patient not taking: Reported on 02/24/2024    [provider]     Critical care time: 40 minutes      Janey Meek, AGNP  Pulmonary/Critical Care Pager [phone] (please enter 7 digits) PCCM Consult Pager [phone] (please enter 7 digits)

## 2024-03-26 NOTE — Plan of Care (Signed)

## 2024-03-26 NOTE — Progress Notes (Signed)
 [facility] [place], [place] 03/26/24  Subjective:   Hospital day # 7   Patient known to our practice from outpatient follow-up of CKD.  She presented to the emergency room with worsening shortness of breath with exertion.  Normally she is able to drive and carry on activities of daily living with ease.  For the last 2 to 3 weeks and especially last 4 days she has been getting short of breath and having to rest even to go from 1 room to the next.  She has developed worsening lower extremity edema.  She is now presented to the hospital for further management.   Patient sitting up in bed Reports she rested well overnight but continues to have fatigue during the day Appetite stable without nausea or vomiting Room air  Renal: 06/05 0701 - 06/06 0700 In: 437.7 [P.O.:120; I.V.:317.7] Out: 2650 [Urine:2650] Lab Results  Component Value Date   CREATININE 1.89 (H) 03/26/2024   CREATININE 1.87 (H) 03/25/2024   CREATININE 1.85 (H) 03/25/2024     Objective:  Vital signs in last 24 hours:  Temp:  [98.4 F (36.9 C)-98.6 F (37 C)] 98.4 F (36.9 C) (06/06 0800) Pulse Rate:  [89-120] 97 (06/06 1100) Resp:  [13-26] 17 (06/06 1100) BP: (80-122)/(45-79) 105/64 (06/06 1100) SpO2:  [90 %-99 %] 93 % (06/06 1100) Weight:  [70.3 kg] 70.3 kg (06/06 0434)  Weight change: 0.1 kg Filed Weights   03/24/24 0720 03/25/24 0500 03/26/24 0434  Weight: 70.2 kg 70.3 kg 70.3 kg    Intake/Output:    Intake/Output Summary (Last 24 hours) at 03/26/2024 1144 Last data filed at 03/26/2024 1100 Gross per 24 hour  Intake 430.33 ml  Output 1750 ml  Net -1319.67 ml     Physical Exam: General: No acute distress  HEENT Moist mucous membranes  Pulm/lungs Normal breathing effort on room air  CVS/Heart Irregular rhythm  Abdomen:  Mildly distended  Extremities: trace pitting edema and lymphedema  Neurologic: Alert and oriented  Skin: No acute rashes          Basic Metabolic  Panel:  Recent Labs  Lab 03/22/24 0321 03/22/24 0910 03/23/24 0355 03/23/24 1446 03/24/24 0427 03/25/24 0532 03/25/24 2232 03/26/24 0509  NA 124*   < > 121* 125* 127* 127* 124* 128*  K 2.7*   < > 2.9* 4.1 3.4* 3.5 3.7 4.0  CL 85*   < > 82* 84* 85* 88* 82* 87*  CO2 24   < > 24 25 27 27 28 30   GLUCOSE 158*   < > 375* 208* 168* 239* 235* 168*  BUN 80*   < > 67* 69* 70* 71* 70* 72*  CREATININE 2.41*   < > 2.32* 2.37* 2.15* 1.85* 1.87* 1.89*  CALCIUM  9.6   < > 8.8* 9.4 9.2 8.9 9.1 9.4  MG 2.8*  --  2.3  --  2.4 2.3  --  2.6*  PHOS  --   --   --   --  3.3 3.0  --   --    < > = values in this interval not displayed.     CBC: Recent Labs  Lab 03/22/24 0321 03/22/24 1118 03/23/24 0355 03/24/24 0427 03/25/24 0532 03/26/24 0509  WBC 5.7  --  7.0 8.4 10.2 9.9  HGB 12.3 14.6  14.6 11.5* 12.0 11.5* 11.4*  HCT 39.6 43.0  43.0 37.1 38.6 37.6 38.0  MCV 68.9*  --  69.5* 69.2* 69.8* 70.2*  PLT 269  --  312 283 289 313      Lab Results  Component Value Date   HEPBSAG NON REACTIVE 03/21/2024   HEPBSAB NON REACTIVE 04/11/2021   HEPBIGM NON REACTIVE 03/21/2024      Microbiology:  Recent Results (from the past 240 hours)  MRSA Next Gen by PCR, Nasal     Status: None   Collection Time: 03/22/24 12:53 PM   Specimen: Nasal Mucosa; Nasal Swab  Result Value Ref Range Status   MRSA by PCR Next Gen NOT DETECTED NOT DETECTED Final    Comment: (NOTE) The GeneXpert MRSA Assay (FDA approved for NASAL specimens only), is one component of a comprehensive MRSA colonization surveillance program. It is not intended to diagnose MRSA infection nor to guide or monitor treatment for MRSA infections. Test performance is not FDA approved in patients less than 84 years old. Performed at [facility], [address] [place], [place] [address]     Coagulation Studies: No results for input(s): "LABPROT", "INR" in the last 72 hours.  Urinalysis: No results for input(s):  "COLORURINE", "LABSPEC", "PHURINE", "GLUCOSEU", "HGBUR", "BILIRUBINUR", "KETONESUR", "PROTEINUR", "UROBILINOGEN", "NITRITE", "LEUKOCYTESUR" in the last 72 hours.  Invalid input(s): "APPERANCEUR"    Imaging: MR CARDIAC MORPHOLOGY W WO CONTRAST Result Date: 03/25/2024 CLINICAL DATA:  HFpEF, evaluate for infiltrative disease EXAM: CARDIAC MRI TECHNIQUE: The patient was scanned on a 1.5 Tesla Siemens magnet. A dedicated cardiac coil was used. Functional imaging was done using Fiesta sequences. 2,3, and 4 chamber views were done to assess for RWMA's. Modified Simpson's rule using a short axis stack was used to calculate an ejection fraction on a dedicated work [employer]. The patient received 10 cc of Gadavist. After 10 minutes inversion recovery sequences were used to assess for infiltration and scar tissue. Velocity flow mapping performed in the ascending aorta and main pulmonary artery. CONTRAST:  10 cc  of Gadavist FINDINGS: 1. Normal left ventricular size, thickness and systolic function (LVEF = 56%). There are no regional wall motion abnormalities. There is no late gadolinium enhancement in the left ventricular myocardium. LVEDV: 54 ml LVESV: 24 ml SV: 30 ml CO: 2.5 L/min Myocardial mass: 57g LV native T1 value 1021 ms (normal <1000 ms) LV ECV value 29% (normal <30%) 2. Normal right ventricular size, thickness and systolic function (RVEF = 52%). There are no regional wall motion abnormalities. 3. Moderately dilated left atrium, mildly dilated right atrial size. 4. Normal size of the aortic root, ascending aorta and pulmonary artery. 5. Mild to moderate mitral regurgitation. Aortic valve appears calcified with atleast moderate aortic valve stenosis visually. 6.  Normal pericardium.  small pericardial effusion. IMPRESSION: 1.  Normal LV size and systolic function.  LVEF 56%. 2.  No LGE or scar. 3.  Moderate aortic stenosis. 4.  Normal RV systolic function. 5.  No evidence for inflammatory  disease. Electronically Signed   By: Constancia Delton M.D.   On: 03/25/2024 23:37   MR CARDIAC VELOCITY FLOW MAP Result Date: 03/25/2024 CLINICAL DATA:  HFpEF, evaluate for infiltrative disease EXAM: CARDIAC MRI TECHNIQUE: The patient was scanned on a 1.5 Tesla Siemens magnet. A dedicated cardiac coil was used. Functional imaging was done using Fiesta sequences. 2,3, and 4 chamber views were done to assess for RWMA's. Modified Simpson's rule using a short axis stack was used to calculate an ejection fraction on a dedicated work [employer], [employer]. The patient received 10 cc of Gadavist. After 10 minutes inversion recovery sequences were used to assess  for infiltration and scar tissue. Velocity flow mapping performed in the ascending aorta and main pulmonary artery. CONTRAST:  10 cc  of Gadavist FINDINGS: 1. Normal left ventricular size, thickness and systolic function (LVEF = 56%). There are no regional wall motion abnormalities. There is no late gadolinium enhancement in the left ventricular myocardium. LVEDV: 54 ml LVESV: 24 ml SV: 30 ml CO: 2.5 L/min Myocardial mass: 57g LV native T1 value 1021 ms (normal <1000 ms) LV ECV value 29% (normal <30%) 2. Normal right ventricular size, thickness and systolic function (RVEF = 52%). There are no regional wall motion abnormalities. 3. Moderately dilated left atrium, mildly dilated right atrial size. 4. Normal size of the aortic root, ascending aorta and pulmonary artery. 5. Mild to moderate mitral regurgitation. Aortic valve appears calcified with atleast moderate aortic valve stenosis visually. 6.  Normal pericardium.  small pericardial effusion. IMPRESSION: 1.  Normal LV size and systolic function.  LVEF 56%. 2.  No LGE or scar. 3.  Moderate aortic stenosis. 4.  Normal RV systolic function. 5.  No evidence for inflammatory disease. Electronically Signed   By: Constancia Delton M.D.   On: 03/25/2024 23:37   MR CARDIAC VELOCITY FLOW MAP Result Date:  03/25/2024 CLINICAL DATA:  HFpEF, evaluate for infiltrative disease EXAM: CARDIAC MRI TECHNIQUE: The patient was scanned on a 1.5 Tesla Siemens magnet. A dedicated cardiac coil was used. Functional imaging was done using Fiesta sequences. 2,3, and 4 chamber views were done to assess for RWMA's. Modified Simpson's rule using a short axis stack was used to calculate an ejection fraction on a dedicated work [employer], [employer]. The patient received 10 cc of Gadavist. After 10 minutes inversion recovery sequences were used to assess for infiltration and scar tissue. Velocity flow mapping performed in the ascending aorta and main pulmonary artery. CONTRAST:  10 cc  of Gadavist FINDINGS: 1. Normal left ventricular size, thickness and systolic function (LVEF = 56%). There are no regional wall motion abnormalities. There is no late gadolinium enhancement in the left ventricular myocardium. LVEDV: 54 ml LVESV: 24 ml SV: 30 ml CO: 2.5 L/min Myocardial mass: 57g LV native T1 value 1021 ms (normal <1000 ms) LV ECV value 29% (normal <30%) 2. Normal right ventricular size, thickness and systolic function (RVEF = 52%). There are no regional wall motion abnormalities. 3. Moderately dilated left atrium, mildly dilated right atrial size. 4. Normal size of the aortic root, ascending aorta and pulmonary artery. 5. Mild to moderate mitral regurgitation. Aortic valve appears calcified with atleast moderate aortic valve stenosis visually. 6.  Normal pericardium.  small pericardial effusion. IMPRESSION: 1.  Normal LV size and systolic function.  LVEF 56%. 2.  No LGE or scar. 3.  Moderate aortic stenosis. 4.  Normal RV systolic function. 5.  No evidence for inflammatory disease. Electronically Signed   By: Constancia Delton M.D.   On: 03/25/2024 23:37      Medications:    milrinone 0.25 mcg/kg/min (03/26/24 1100)   norepinephrine (LEVOPHED) Adult infusion 6 mcg/min (03/26/24 1100)    allopurinol   100 mg Oral Daily    apixaban   5 mg Oral BID   Chlorhexidine  Gluconate Cloth  6 each Topical Daily   docusate sodium   100 mg Oral BID   feeding supplement  237 mL Oral BID BM   folic acid   1 mg Oral Daily   furosemide   80 mg Intravenous BID   insulin  aspart  0-15 Units Subcutaneous TID WC   insulin   aspart  0-5 Units Subcutaneous QHS   insulin  aspart  3 Units Subcutaneous TID WC   insulin  glargine-yfgn  10 Units Subcutaneous BID   latanoprost   1 drop Both Eyes QHS   midodrine   10 mg Oral Q8H   pantoprazole   40 mg Oral BID   polyethylene glycol  17 g Oral Daily   potassium chloride   40 mEq Oral Once   rosuvastatin   5 mg Oral Weekly   senna  1 tablet Oral Daily   sodium chloride  flush  10-40 mL Intracatheter Q12H   acetaminophen , albuterol , bisacodyl , dextromethorphan-guaiFENesin , ondansetron  (ZOFRAN ) IV, mouth rinse, oxyCODONE, sodium chloride  flush, traMADol , traZODone  Assessment/ Plan:  78 y.o. female with  multiple medical problems including chronic kidney disease, hypertension, atrial fibrillation, hyperlipidemia, chronic lower extremity edema, diastolic dysfunction, diabetes, osteoarthritis of the knee, chronic back pain  admitted on 03/19/2024 for Shortness of breath [R06.02] Acute on chronic diastolic heart failure (HCC) [I50.33] Acute on chronic diastolic CHF (congestive heart failure) (HCC) [I50.33]  1.  Acute on chronic diastolic CHF, with worsening lower extremity edema and shortness of breath 2.  Acute kidney injury on chronic kidney disease stage IV.  Baseline creatinine 2.6/GFR 19 from April 2025. Creatinine has worsened to 2.65, likely secondary to aggressive diuresis. 3.  Hypokalemia 4.  Hyponatremia. 5.  Diabetes type 2 with CKD.  Hemoglobin A1c 7.4% from February 2025. 6.  Constipation  2D echo 03/20/2024-LVEF 60 to 65%, moderate concentric LVH, normal right ventricular systolic function small pericardial effusion, normal aortic valve, severe mitral annular calcification  Plan: RHC on  6/2 showed severely decreased cardiac output, biventricular failure with RV dysfunction. Cardiology feels this is end stage diastolic heart failure. Milrinone drip with IV Furosemide .  Adequate diuresis with prescribed medications and as needed metolazone .  Potassium corrected to 4.0 Sodium 128 . Received Tolvaptan on 6/3 Continue Ensure supplementation twice daily Primary team to continue management of SSI  Continue bowel regimen. No BM recorded with lactulose. May require PRN suppository.    LOS: 7 [facility] 6/6/202511:44 AM  [address] [place], [place] [phone]

## 2024-03-26 NOTE — Progress Notes (Signed)
 Advanced Heart Failure Rounding Note   Subjective:    Remains on NE 7 -> 6 and milrinone 0.25. + midodrine  10 tid  Co-ox 65%   Continue to diurese well but weight relatively unchanged. CVP no longer hooked up   Feels fatigued. Denies SOB.  Was able to get out of bed with PT assistance yesterday  cMRI 03/25/24: EF 56% RV ok moderate AS no LGE    Objective:   Weight Range:  Vital Signs:   Temp:  [98.6 F (37 C)] 98.6 F (37 C) (06/06 0000) Pulse Rate:  [89-120] 105 (06/06 0345) Resp:  [13-33] 20 (06/06 0345) BP: (71-122)/(45-91) 91/60 (06/06 0345) SpO2:  [80 %-99 %] 94 % (06/06 0345) Weight:  [70.3 kg] 70.3 kg (06/06 0434) Last BM Date :  (PTA)  Weight change: Filed Weights   03/24/24 0720 03/25/24 0500 03/26/24 0434  Weight: 70.2 kg 70.3 kg 70.3 kg    Intake/Output:   Intake/Output Summary (Last 24 hours) at 03/26/2024 0817 Last data filed at 03/26/2024 0100 Gross per 24 hour  Intake 353.94 ml  Output 2200 ml  Net -1846.06 ml     Physical Exam: General:  Elderly No resp difficulty HEENT: normal Neck: supple. JVP to jaw  Cor: Irreg 2/6 AS Lungs: clear Abdomen: soft, nontender, nondistended. No hepatosplenomegaly. No bruits or masses. Good bowel sounds. Extremities: no cyanosis, clubbing, rash, tr edema Neuro: alert & orientedx3, cranial nerves grossly intact. moves all 4 extremities w/o difficulty. Affect pleasant  Telemetry: AF 100-110 Personally reviewed  Labs: Basic Metabolic Panel: Recent Labs  Lab 03/19/24 1038 03/19/24 2009 03/22/24 0321 03/22/24 0910 03/23/24 0355 03/23/24 1446 03/24/24 0427 03/25/24 0532 03/25/24 2232 03/26/24 0509  NA 127*   < > 124*   < > 121* 125* 127* 127* 124* 128*  K 4.4   < > 2.7*   < > 2.9* 4.1 3.4* 3.5 3.7 4.0  CL 90*   < > 85*   < > 82* 84* 85* 88* 82* 87*  CO2 23   < > 24   < > 24 25 27 27 28 30   GLUCOSE 159*   < > 158*   < > 375* 208* 168* 239* 235* 168*  BUN 76*   < > 80*   < > 67* 69* 70* 71* 70* 72*   CREATININE 2.81*   < > 2.41*   < > 2.32* 2.37* 2.15* 1.85* 1.87* 1.89*  CALCIUM  9.5   < > 9.6   < > 8.8* 9.4 9.2 8.9 9.1 9.4  MG 2.3  --  2.8*  --  2.3  --  2.4 2.3  --  2.6*  PHOS 4.7*  --   --   --   --   --  3.3 3.0  --   --    < > = values in this interval not displayed.    Liver Function Tests: Recent Labs  Lab 03/19/24 1038 03/23/24 1446  AST 19 22  ALT 16 12  ALKPHOS 88 80  BILITOT 2.1* 3.0*  PROT 6.3* 6.4*  ALBUMIN  3.8 3.8   No results for input(s): "LIPASE", "AMYLASE" in the last 168 hours. No results for input(s): "AMMONIA" in the last 168 hours.  CBC: Recent Labs  Lab 03/22/24 0321 03/22/24 1118 03/23/24 0355 03/24/24 0427 03/25/24 0532 03/26/24 0509  WBC 5.7  --  7.0 8.4 10.2 9.9  HGB 12.3 14.6  14.6 11.5* 12.0 11.5* 11.4*  HCT 39.6 43.0  43.0  37.1 38.6 37.6 38.0  MCV 68.9*  --  69.5* 69.2* 69.8* 70.2*  PLT 269  --  312 283 289 313    Cardiac Enzymes: No results for input(s): "CKTOTAL", "CKMB", "CKMBINDEX", "TROPONINI" in the last 168 hours.  BNP: BNP (last 3 results) Recent Labs    12/19/23 1624 03/19/24 1038  BNP 132.4* 265.4*    ProBNP (last 3 results) No results for input(s): "PROBNP" in the last 8760 hours.    Other results:  Imaging: MR CARDIAC MORPHOLOGY W WO CONTRAST Result Date: 03/25/2024 CLINICAL DATA:  HFpEF, evaluate for infiltrative disease EXAM: CARDIAC MRI TECHNIQUE: The patient was scanned on a 1.5 Tesla Siemens magnet. A dedicated cardiac coil was used. Functional imaging was done using Fiesta sequences. 2,3, and 4 chamber views were done to assess for RWMA's. Modified Simpson's rule using a short axis stack was used to calculate an ejection fraction on a dedicated work [employer]. The patient received 10 cc of Gadavist. After 10 minutes inversion recovery sequences were used to assess for infiltration and scar tissue. Velocity flow mapping performed in the ascending aorta and main pulmonary artery.  CONTRAST:  10 cc  of Gadavist FINDINGS: 1. Normal left ventricular size, thickness and systolic function (LVEF = 56%). There are no regional wall motion abnormalities. There is no late gadolinium enhancement in the left ventricular myocardium. LVEDV: 54 ml LVESV: 24 ml SV: 30 ml CO: 2.5 L/min Myocardial mass: 57g LV native T1 value 1021 ms (normal <1000 ms) LV ECV value 29% (normal <30%) 2. Normal right ventricular size, thickness and systolic function (RVEF = 52%). There are no regional wall motion abnormalities. 3. Moderately dilated left atrium, mildly dilated right atrial size. 4. Normal size of the aortic root, ascending aorta and pulmonary artery. 5. Mild to moderate mitral regurgitation. Aortic valve appears calcified with atleast moderate aortic valve stenosis visually. 6.  Normal pericardium.  small pericardial effusion. IMPRESSION: 1.  Normal LV size and systolic function.  LVEF 56%. 2.  No LGE or scar. 3.  Moderate aortic stenosis. 4.  Normal RV systolic function. 5.  No evidence for inflammatory disease. Electronically Signed   By: Constancia Delton M.D.   On: 03/25/2024 23:37   MR CARDIAC VELOCITY FLOW MAP Result Date: 03/25/2024 CLINICAL DATA:  HFpEF, evaluate for infiltrative disease EXAM: CARDIAC MRI TECHNIQUE: The patient was scanned on a 1.5 Tesla Siemens magnet. A dedicated cardiac coil was used. Functional imaging was done using Fiesta sequences. 2,3, and 4 chamber views were done to assess for RWMA's. Modified Simpson's rule using a short axis stack was used to calculate an ejection fraction on a dedicated work [employer] officer, [employer]. The patient received 10 cc of Gadavist. After 10 minutes inversion recovery sequences were used to assess for infiltration and scar tissue. Velocity flow mapping performed in the ascending aorta and main pulmonary artery. CONTRAST:  10 cc  of Gadavist FINDINGS: 1. Normal left ventricular size, thickness and systolic function (LVEF = 56%). There are no  regional wall motion abnormalities. There is no late gadolinium enhancement in the left ventricular myocardium. LVEDV: 54 ml LVESV: 24 ml SV: 30 ml CO: 2.5 L/min Myocardial mass: 57g LV native T1 value 1021 ms (normal <1000 ms) LV ECV value 29% (normal <30%) 2. Normal right ventricular size, thickness and systolic function (RVEF = 52%). There are no regional wall motion abnormalities. 3. Moderately dilated left atrium, mildly dilated right atrial size. 4. Normal size of the aortic root,  ascending aorta and pulmonary artery. 5. Mild to moderate mitral regurgitation. Aortic valve appears calcified with atleast moderate aortic valve stenosis visually. 6.  Normal pericardium.  small pericardial effusion. IMPRESSION: 1.  Normal LV size and systolic function.  LVEF 56%. 2.  No LGE or scar. 3.  Moderate aortic stenosis. 4.  Normal RV systolic function. 5.  No evidence for inflammatory disease. Electronically Signed   By: Constancia Delton M.D.   On: 03/25/2024 23:37   MR CARDIAC VELOCITY FLOW MAP Result Date: 03/25/2024 CLINICAL DATA:  HFpEF, evaluate for infiltrative disease EXAM: CARDIAC MRI TECHNIQUE: The patient was scanned on a 1.5 Tesla Siemens magnet. A dedicated cardiac coil was used. Functional imaging was done using Fiesta sequences. 2,3, and 4 chamber views were done to assess for RWMA's. Modified Simpson's rule using a short axis stack was used to calculate an ejection fraction on a dedicated work [employer] officer, [employer]. The patient received 10 cc of Gadavist. After 10 minutes inversion recovery sequences were used to assess for infiltration and scar tissue. Velocity flow mapping performed in the ascending aorta and main pulmonary artery. CONTRAST:  10 cc  of Gadavist FINDINGS: 1. Normal left ventricular size, thickness and systolic function (LVEF = 56%). There are no regional wall motion abnormalities. There is no late gadolinium enhancement in the left ventricular myocardium. LVEDV: 54 ml LVESV: 24  ml SV: 30 ml CO: 2.5 L/min Myocardial mass: 57g LV native T1 value 1021 ms (normal <1000 ms) LV ECV value 29% (normal <30%) 2. Normal right ventricular size, thickness and systolic function (RVEF = 52%). There are no regional wall motion abnormalities. 3. Moderately dilated left atrium, mildly dilated right atrial size. 4. Normal size of the aortic root, ascending aorta and pulmonary artery. 5. Mild to moderate mitral regurgitation. Aortic valve appears calcified with atleast moderate aortic valve stenosis visually. 6.  Normal pericardium.  small pericardial effusion. IMPRESSION: 1.  Normal LV size and systolic function.  LVEF 56%. 2.  No LGE or scar. 3.  Moderate aortic stenosis. 4.  Normal RV systolic function. 5.  No evidence for inflammatory disease. Electronically Signed   By: Constancia Delton M.D.   On: 03/25/2024 23:37      Medications:     Scheduled Medications:  allopurinol   100 mg Oral Daily   apixaban   5 mg Oral BID   Chlorhexidine  Gluconate Cloth  6 each Topical Daily   docusate sodium   100 mg Oral BID   feeding supplement  237 mL Oral BID BM   folic acid   1 mg Oral Daily   furosemide   80 mg Intravenous BID   insulin  aspart  0-15 Units Subcutaneous TID WC   insulin  aspart  0-5 Units Subcutaneous QHS   insulin  aspart  3 Units Subcutaneous TID WC   insulin  glargine-yfgn  10 Units Subcutaneous BID   lactulose  30 g Oral Once   latanoprost   1 drop Both Eyes QHS   midodrine   10 mg Oral Q8H   pantoprazole   40 mg Oral BID   polyethylene glycol  17 g Oral Daily   potassium chloride   40 mEq Oral Once   rosuvastatin   5 mg Oral Weekly   senna  1 tablet Oral Daily   sodium chloride  flush  10-40 mL Intracatheter Q12H    Infusions:  milrinone 0.25 mcg/kg/min (03/26/24 0543)   norepinephrine (LEVOPHED) Adult infusion 6 mcg/min (03/26/24 0100)    PRN Medications: acetaminophen , albuterol , bisacodyl , dextromethorphan-guaiFENesin , ondansetron  (ZOFRAN ) IV, mouth rinse,  oxyCODONE,  sodium chloride  flush, traMADol , traZODone   Assessment/Plan:   1. Acute on Chronic Biventricular Heart Failure; HFpEF; Predominantly RV Failure - EF 60-65% with mod-severe AS normal RV on echo read, however RHC with low output biventricular heart failure - RHC 03/22/24: RA 18, PA 38/22 (30), PCW 26, CO/CI 3.06/1.85, PAPi 0.89 - on NE 6 and milrinone 0.25 and midodrine  10 tid. Co-ox 65% CVP not hooked up - cMRI 03/25/24: EF 56% RV ok moderate AS no LGE - Diuresing well but still fluid overloaded. 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   - Agree with short-course of inotropic/pressor support to see how well we can optimize her but suspect long-term options are quite limited.  - Once fully diuresed (likely over the weekend. Shoot for CVP ~8-10) will attempt wean off NE/milrinone as toelrated. On midodrine  to facilitate   2. 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 Up to 2.8 on admission - improving with diuresis and inotrope/pressor support. Scr now stable at 1.8-1.9 with inotrope support - nephrology following, appreciate recs  3. Moderate to severe AS/moderate MR - stable   4. Small pericardial Effusion - h/o pericardial effusion s/p pericardiocentesis 700 ml in 07/2021. Bloody, neg for malignancy. - small w/o tamponade on echo - MRI ok   5. Atrial Fibrillation: Permanent - hold ? blocker with milrinone and low output - continue Eliquis  5 mg bid - rate a bit faster today off b-blocker   7. Hyponatremia - NA 128. improved with tolvaptan 6/3 - restrict FW   8.Hypokalemia - K 4.0 today  -Can add back spiro as BP toelrates - continue replacement for goal K>4   9. H/o cirrhosis - s/p paracentesis in 2022 - CT chest with suspected liver cirrhosis - suspect this was/is in the setting of cardiohepatic syndrome -  AST/ALT nl on admit  10. Iron  deficiency anemia - will give IV iron  after cMRI  11. Debility - Continue PT/OT - OOB to chair   CRITICAL CARE Performed by: Jules Oar  Total critical care time: 38 minutes  Critical care time was exclusive of separately billable procedures and treating other patients.  Critical care was necessary to treat or prevent imminent or life-threatening deterioration.  Critical care was time spent personally by me (independent of midlevel providers or residents) on the following activities: development of treatment plan with patient and/or surrogate as well as nursing, discussions with consultants, evaluation of patient's response to treatment, examination of patient, obtaining history from patient or surrogate, ordering and performing treatments and interventions, ordering and review of laboratory studies, ordering and review of radiographic studies, pulse oximetry and re-evaluation of patient's condition.   Length of Stay: 7   Jules Oar MD 03/26/2024, 8:17 AM  Advanced Heart Failure Team Pager [phone] (M-F; 7a - 4p)  Please contact [facility] for night-coverage after hours (4p -7a ) and weekends on [email or web address]

## 2024-03-26 NOTE — Plan of Care (Signed)
 Continuing with plan of care.

## 2024-03-26 NOTE — Progress Notes (Signed)
 Patient expressed desire to keep foley catheter as she is receiving dieretics, relayed concerns to provider had ordered to allow foley to remain at this time for accurate output while diuresing.

## 2024-03-26 NOTE — Progress Notes (Addendum)
 PHARMACY CONSULT NOTE - FOLLOW UP  Pharmacy Consult for Electrolyte Monitoring and Replacement   Recent Labs: Potassium (mmol/L)  Date Value  03/26/2024 4.0   Magnesium  (mg/dL)  Date Value  96/01/5408 2.6 (H)   Calcium  (mg/dL)  Date Value  81/19/1478 9.4   Albumin  (g/dL)  Date Value  29/56/2130 3.8  12/19/2023 4.1   Phosphorus (mg/dL)  Date Value  86/57/8469 3.0   Sodium (mmol/L)  Date Value  03/26/2024 128 (L)  12/19/2023 133 (L)     Assessment: 78 y.o. female with medical history significant of diastolic CHF, hypertension, hyperlipidemia, diabetes mellitus, PAD, gout, GI bleeding, CKD-3B, A-fib on Eliquis , chronic hyponatremia, chronic venous insufficiency, lymphedema, who presented with SOB and worsening leg edema. Pharmacy is asked to follow and replace electrolytes while in CCU  Diuretics: furosemide  80 mg IV BID  Goal of Therapy:  Electrolytes WNL  Plan:  ---40 mEq po KCl x 1 per MD order ---recheck electrolytes in am  Adalberto Acton ,PharmD Clinical Pharmacist 03/26/2024 7:01 AM

## 2024-03-27 DIAGNOSIS — I352 Nonrheumatic aortic (valve) stenosis with insufficiency: Secondary | ICD-10-CM

## 2024-03-27 DIAGNOSIS — I3139 Other pericardial effusion (noninflammatory): Secondary | ICD-10-CM | POA: Diagnosis not present

## 2024-03-27 DIAGNOSIS — I272 Pulmonary hypertension, unspecified: Secondary | ICD-10-CM | POA: Diagnosis not present

## 2024-03-27 DIAGNOSIS — K7469 Other cirrhosis of liver: Secondary | ICD-10-CM

## 2024-03-27 DIAGNOSIS — I4891 Unspecified atrial fibrillation: Secondary | ICD-10-CM | POA: Diagnosis not present

## 2024-03-27 DIAGNOSIS — I130 Hypertensive heart and chronic kidney disease with heart failure and stage 1 through stage 4 chronic kidney disease, or unspecified chronic kidney disease: Secondary | ICD-10-CM

## 2024-03-27 DIAGNOSIS — I5033 Acute on chronic diastolic (congestive) heart failure: Secondary | ICD-10-CM | POA: Diagnosis not present

## 2024-03-27 LAB — CBC
HCT: 37.3 % (ref 36.0–46.0)
Hemoglobin: 11.5 g/dL — ABNORMAL LOW (ref 12.0–15.0)
MCH: 21.3 pg — ABNORMAL LOW (ref 26.0–34.0)
MCHC: 30.8 g/dL (ref 30.0–36.0)
MCV: 69.2 fL — ABNORMAL LOW (ref 80.0–100.0)
Platelets: 309 10*3/uL (ref 150–400)
RBC: 5.39 MIL/uL — ABNORMAL HIGH (ref 3.87–5.11)
RDW: 20.9 % — ABNORMAL HIGH (ref 11.5–15.5)
WBC: 9.4 10*3/uL (ref 4.0–10.5)
nRBC: 0 % (ref 0.0–0.2)

## 2024-03-27 LAB — COOXEMETRY PANEL
Carboxyhemoglobin: 1.9 % — ABNORMAL HIGH (ref 0.5–1.5)
Methemoglobin: 1.2 % (ref 0.0–1.5)
O2 Saturation: 67.9 %
Total hemoglobin: 11.5 g/dL — ABNORMAL LOW (ref 12.0–16.0)
Total oxygen content: 65.8 %

## 2024-03-27 LAB — BASIC METABOLIC PANEL WITH GFR
Anion gap: 17 — ABNORMAL HIGH (ref 5–15)
BUN: 73 mg/dL — ABNORMAL HIGH (ref 8–23)
CO2: 26 mmol/L (ref 22–32)
Calcium: 9.1 mg/dL (ref 8.9–10.3)
Chloride: 82 mmol/L — ABNORMAL LOW (ref 98–111)
Creatinine, Ser: 1.9 mg/dL — ABNORMAL HIGH (ref 0.44–1.00)
GFR, Estimated: 27 mL/min — ABNORMAL LOW (ref >60)
Glucose, Bld: 153 mg/dL — ABNORMAL HIGH (ref 70–99)
Potassium: 3.1 mmol/L — ABNORMAL LOW (ref 3.5–5.1)
Sodium: 125 mmol/L — ABNORMAL LOW (ref 135–145)

## 2024-03-27 LAB — GLUCOSE, CAPILLARY
Glucose-Capillary: 175 mg/dL — ABNORMAL HIGH (ref 70–99)
Glucose-Capillary: 140 mg/dL — ABNORMAL HIGH (ref 70–99)
Glucose-Capillary: 120 mg/dL — ABNORMAL HIGH (ref 70–99)
Glucose-Capillary: 153 mg/dL — ABNORMAL HIGH (ref 70–99)

## 2024-03-27 LAB — MAGNESIUM: Magnesium: 2.7 mg/dL — ABNORMAL HIGH (ref 1.7–2.4)

## 2024-03-27 MED ORDER — POTASSIUM CHLORIDE CRYS ER 20 MEQ PO TBCR
40.0000 meq | EXTENDED_RELEASE_TABLET | Freq: Two times a day (BID) | ORAL | Status: AC
  Administered 2024-03-27 (×2): 40 meq via ORAL
  Filled 2024-03-27 (×2): qty 2

## 2024-03-27 MED ORDER — MAGNESIUM CITRATE PO SOLN
1.0000 | Freq: Once | ORAL | Status: AC
  Administered 2024-03-27: 1 via ORAL
  Filled 2024-03-27: qty 296

## 2024-03-27 NOTE — Progress Notes (Signed)
 Cardiology Progress Note   Patient Name: Karen Farley Date of Encounter: 03/27/2024  Primary Cardiologist: Timothy Gollan, MD  Subjective   Sitting on commode at bedside.  Just got Mg citrate - no response thus far.  Notes some dyspnea when changing positions, moving to commode.  No c/p.  Objective   Inpatient Medications    Scheduled Meds:  allopurinol   100 mg Oral Daily   apixaban   5 mg Oral BID   Chlorhexidine  Gluconate Cloth  6 each Topical Daily   docusate sodium   100 mg Oral BID   feeding supplement  237 mL Oral BID BM   folic acid   1 mg Oral Daily   furosemide   80 mg Intravenous BID   insulin  aspart  0-15 Units Subcutaneous TID WC   insulin  aspart  0-5 Units Subcutaneous QHS   insulin  aspart  3 Units Subcutaneous TID WC   insulin  glargine-yfgn  10 Units Subcutaneous BID   latanoprost   1 drop Both Eyes QHS   midodrine   10 mg Oral Q8H   pantoprazole   40 mg Oral BID   polyethylene glycol  17 g Oral Daily   potassium chloride   40 mEq Oral BID   rosuvastatin   5 mg Oral Weekly   senna  1 tablet Oral Daily   sodium chloride  flush  10-40 mL Intracatheter Q12H   Continuous Infusions:  milrinone  0.25 mcg/kg/min (03/27/24 1200)   norepinephrine  (LEVOPHED ) Adult infusion 6 mcg/min (03/27/24 1200)   PRN Meds: acetaminophen , albuterol , bisacodyl , dextromethorphan-guaiFENesin , ondansetron  (ZOFRAN ) IV, mouth rinse, oxyCODONE , sodium chloride  flush, traMADol , traZODone    Vital Signs    Vitals:   03/27/24 0900 03/27/24 1000 03/27/24 1100 03/27/24 1200  BP: 104/62 104/68 (!) 100/55 116/73  Pulse: 97 87 (!) 121 93  Resp: 18 17    Temp:    (!) 97.5 F (36.4 C)  TempSrc:    Oral  SpO2: 96% 92% 96% 96%  Weight:      Height:        Intake/Output Summary (Last 24 hours) at 03/27/2024 1212 Last data filed at 03/27/2024 1200 Gross per 24 hour  Intake 533.7 ml  Output 1120 ml  Net -586.3 ml   Filed Weights   03/25/24 0500 03/26/24 0434 03/27/24 0500  Weight: 70.3 kg  70.3 kg 64.5 kg    Physical Exam   GEN: Well nourished, well developed, in no acute distress.  HEENT: Grossly normal.  Neck: Supple, mod elev JVP, no carotid bruits or masses. Cardiac: IR, IR, 2/6 syst murmur @ upper sternal borders, no rubs or gallops. No clubbing, cyanosis, trace bilat LE edema.  Radials 2+, DP/PT 2+ and equal bilaterally.  Respiratory:  Respirations regular and unlabored, clear to auscultation bilaterally. GI: firm, protuberant, nontender, BS + x 4. MS: no deformity or atrophy. Skin: warm and dry, no rash. Neuro:  Strength and sensation are intact. Psych: AAOx3.  Normal affect.  Labs    Chemistry Recent Labs  Lab 03/23/24 1446 03/24/24 0427 03/25/24 2232 03/26/24 0509 03/27/24 0402  NA 125*   < > 124* 128* 125*  K 4.1   < > 3.7 4.0 3.1*  CL 84*   < > 82* 87* 82*  CO2 25   < > 28 30 26   GLUCOSE 208*   < > 235* 168* 153*  BUN 69*   < > 70* 72* 73*  CREATININE 2.37*   < > 1.87* 1.89* 1.90*  CALCIUM  9.4   < > 9.1 9.4 9.1  PROT 6.4*  --   --   --   --   ALBUMIN  3.8  --   --   --   --   AST 22  --   --   --   --   ALT 12  --   --   --   --   ALKPHOS 80  --   --   --   --   BILITOT 3.0*  --   --   --   --   GFRNONAA 21*   < > 27* 27* 27*  ANIONGAP 16*   < > 14 11 17*   < > = values in this interval not displayed.     Hematology Recent Labs  Lab 03/25/24 0532 03/26/24 0509 03/27/24 0402  WBC 10.2 9.9 9.4  RBC 5.39* 5.41* 5.39*  HGB 11.5* 11.4* 11.5*  HCT 37.6 38.0 37.3  MCV 69.8* 70.2* 69.2*  MCH 21.3* 21.1* 21.3*  MCHC 30.6 30.0 30.8  RDW 20.3* 20.5* 20.9*  PLT 289 313 309    Cardiac Enzymes  Recent Labs  Lab 03/19/24 1038 03/19/24 1643  TROPONINIHS 26* 25*      BNP    Component Value Date/Time   BNP 265.4 (H) 03/19/2024 1038    Lipids  Lab Results  Component Value Date   CHOL 176 12/19/2023   HDL 53 12/19/2023   LDLCALC 111 (H) 12/19/2023   TRIG 61 12/19/2023    HbA1c  Lab Results  Component Value Date   HGBA1C 7.6  (H) 03/24/2024    Radiology    -------------   Telemetry    Afib, 80's to low 100's - Personally Reviewed  Cardiac Studies   2D Echocardiogram 5.31.2025  1. Intracavitary gradient. Peak velocity 2.31 m/s. Peak gradient 21.3  mmHg. Left ventricular ejection fraction, by estimation, is 60 to 65%. The  left ventricle has normal function. The left ventricle has no regional  wall motion abnormalities. There is  moderate concentric left ventricular hypertrophy.   2. Right ventricular systolic function is normal. The right ventricular  size is normal. There is normal pulmonary artery systolic pressure.   3. Mostly lateral to the RV. There is also echogenic material in the  pericardium lateral to the RV apex. This is visualized on prior echo as  well. May represent fat pad. However, it may also represent evidence of  prior hemopericardium. a small  pericardial effusion is present. There is no evidence of cardiac  tamponade.   4. Mobile MAC on the posterior mitral valve annulus. The mitral valve is  normal in structure. Mild mitral valve regurgitation. Mild mitral  stenosis. The mean mitral valve gradient is 3.5 mmHg with average heart  rate of 79 bpm. Severe mitral annular  calcification.   5. The aortic valve is normal in structure. There is severe calcifcation  of the aortic valve. There is severe thickening of the aortic valve.  Aortic valve regurgitation is not visualized. Mild aortic valve stenosis.  Aortic valve area, by VTI measures  1.10 cm. Aortic valve mean gradient measures 13.8 mmHg. Aortic valve Vmax  measures 2.57 m/s.   6. The inferior vena cava is normal in size with greater than 50%  respiratory variability, suggesting right atrial pressure of 3 mmHg.  _____________   Cardiac Catheterization  6.2.2025  RA: 18 mmHg RV: 38/5 with an EDP of 22 mmHg PW: 26 mmHg PA: 38/22 with a mean of 30 mmHg PA sat was 55%. Cardiac output  was 3.06 with an index of 1.85.    Recommendations: The patient has evidence of biventricular failure with prominent RV dysfunction.  She likely has underlying cardiorenal syndrome and not able to diurese with some form of inotropic therapy.  Recommend starting small milrinone  drip and IV diuresis.   _____________   Cardiac MRI 6.5.2025  IMPRESSION: 1.  Normal LV size and systolic function.  LVEF 56%.  2.  No LGE or scar.  3.  Moderate aortic stenosis.  4.  Normal RV systolic function.  5.  No evidence for inflammatory disease. _____________   Patient Profile     78 y.o. female  with a hx of HFpEF, hypotension on midodrine , h/o pericardial effusion s/p pericardiocentesis, CKD stage 3, permanent Afib, HLD, lymphedema, h/o GIB, and diabetes, who was admitted w/ recurrent biventricular CHF (HFpEF).  Assessment & Plan    1. Acute on Chronic Biventricular Heart Failure; HFpEF; Predominantly RV Failure - EF 60-65% with mod-severe AS normal RV on echo read, however RHC with low output biventricular heart failure - RHC 03/22/24: RA 18, PA 38/22 (30), PCW 26, CO/CI 3.06/1.85, PAPi 0.89 - cMRI 03/25/24: EF 56% RV ok moderate AS no LGE - on NE @ 6 - nsg indicated that pressure drops w/ any down-titration of NE.  Also on midodrine  10 tid. Co-ox 65.8%.  CVP trending 16-20. - minus 1.2 L yesterday and 10.2 L since admission.  Wt recorded as down 5.8 L overnight - standing scale (64.5 kg - lowest recorded wt dating back to 2015). -Renal fxn relatively stable w/ BUN/Creat 73/1.90.  CO2 stable @ 26. -Still w/ abd bloating, trace ankle edema, and dyspnea w/ minimal activity. - Continue TED hose - Cont IV diuresis today.  Once fully diuresed (Shoot for CVP ~8-10) will attempt wean off NE/milrinone  as toelrated. 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     2. 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 Up to 2.8 on admission - BUN/Creat 73/1.90 - nephrology following, appreciate recs   3. Moderate to severe AS/moderate MR - stable   4. Small pericardial Effusion - h/o pericardial effusion s/p pericardiocentesis 700 ml in 07/2021. Bloody, neg for malignancy. - small w/o tamponade on echo - MRI ok   5. Atrial Fibrillation: Permanent - hold ? blocker with milrinone  and low output - continue Eliquis  5 mg bid - rates stable - mostly 90's.   7. Hyponatremia - NA 125 this AM. -received tolvaptan  6/3. Consider re-dosing. -restrict free H2O.   8.Hypokalemia - K 3.1 today. - BPs soft, no spiro. - continue replacement for goal K>4   9. H/o cirrhosis - s/p paracentesis in 2022 - CT chest with suspected liver cirrhosis - suspect this was/is in the setting of cardiohepatic syndrome - AST/ALT nl on admit   10. Iron  deficiency anemia - s/p IV iron . -H/H stable.   11. Debility - Continue PT/OT - OOB to chair   Signed, Laneta Pintos, NP  03/27/2024, 12:12 PM    For questions or updates, please contact   Please consult [email or web address] for contact info under Cardiology/STEMI.

## 2024-03-27 NOTE — Progress Notes (Signed)
 PHARMACY CONSULT NOTE  Pharmacy Consult for Electrolyte Monitoring and Replacement   Recent Labs: Potassium (mmol/L)  Date Value  03/27/2024 3.1 (L)   Magnesium  (mg/dL)  Date Value  16/07/9603 2.7 (H)   Calcium  (mg/dL)  Date Value  54/06/8118 9.1   Albumin  (g/dL)  Date Value  14/78/2956 3.8  12/19/2023 4.1   Phosphorus (mg/dL)  Date Value  21/30/8657 3.0   Sodium (mmol/L)  Date Value  03/27/2024 125 (L)  12/19/2023 133 (L)   Assessment: 77 y.o. female with medical history significant of diastolic CHF, hypertension, hyperlipidemia, diabetes mellitus, PAD, gout, GI bleeding, CKD-3B, A-fib on Eliquis , chronic hyponatremia, chronic venous insufficiency, lymphedema, who presented with SOB and worsening leg edema. Pharmacy is asked to follow and replace electrolytes while in CCU  Relevant medications: Lasix  80 mg IV BID, milrinone , NE, midodrine   Goal of Therapy:  Electrolytes WNL  Plan:  --Kcl 40 mEq PO BID x 2 doses --Re-check electrolytes with AM labs tomorrow AM  Page Boast 03/27/2024 6:58 AM

## 2024-03-27 NOTE — Progress Notes (Signed)
 Patient's urine output has declined to an output of 69mL/hr and is tea in color, nephrology and cardiology providers notified.

## 2024-03-27 NOTE — Progress Notes (Signed)
 [facility]  PROGRESS NOTE   Subjective:   Patient seen at bedside.  Urine output is marginal.  Objective:  Vital signs: Blood pressure (!) 100/59, pulse 100, temperature (!) 97.5 F (36.4 C), temperature source Oral, resp. rate 16, height 5' (1.524 m), weight 64.5 kg, SpO2 95%.  Intake/Output Summary (Last 24 hours) at 03/27/2024 1440 Last data filed at 03/27/2024 1400 Gross per 24 hour  Intake 533.61 ml  Output 1230 ml  Net -696.39 ml   Filed Weights   03/25/24 0500 03/26/24 0434 03/27/24 0500  Weight: 70.3 kg 70.3 kg 64.5 kg     Physical Exam: General:  No acute distress  Head:  Normocephalic, atraumatic. Moist oral mucosal membranes  Eyes:  Anicteric  Neck:  Supple  Lungs:   Clear to auscultation, normal effort  Heart:  S1S2 no rubs  Abdomen:   Soft, nontender, bowel sounds present  Extremities:  peripheral edema.  Neurologic:  Awake, alert, following commands  Skin:  No lesions  Access:     Basic Metabolic Panel: Recent Labs  Lab 03/23/24 0355 03/23/24 1446 03/24/24 0427 03/25/24 0532 03/25/24 2232 03/26/24 0509 03/27/24 0402  NA 121*   < > 127* 127* 124* 128* 125*  K 2.9*   < > 3.4* 3.5 3.7 4.0 3.1*  CL 82*   < > 85* 88* 82* 87* 82*  CO2 24   < > 27 27 28 30 26   GLUCOSE 375*   < > 168* 239* 235* 168* 153*  BUN 67*   < > 70* 71* 70* 72* 73*  CREATININE 2.32*   < > 2.15* 1.85* 1.87* 1.89* 1.90*  CALCIUM  8.8*   < > 9.2 8.9 9.1 9.4 9.1  MG 2.3  --  2.4 2.3  --  2.6* 2.7*  PHOS  --   --  3.3 3.0  --   --   --    < > = values in this interval not displayed.   GFR: Estimated Creatinine Clearance: 20.5 mL/min (A) (by C-G formula based on SCr of 1.9 mg/dL (H)).  Liver Function Tests: Recent Labs  Lab 03/23/24 1446  AST 22  ALT 12  ALKPHOS 80  BILITOT 3.0*  PROT 6.4*  ALBUMIN  3.8   No results for input(s): "LIPASE", "AMYLASE" in the last 168 hours. No results for input(s): "AMMONIA" in the last 168 hours.  CBC: Recent Labs  Lab  03/23/24 0355 03/24/24 0427 03/25/24 0532 03/26/24 0509 03/27/24 0402  WBC 7.0 8.4 10.2 9.9 9.4  HGB 11.5* 12.0 11.5* 11.4* 11.5*  HCT 37.1 38.6 37.6 38.0 37.3  MCV 69.5* 69.2* 69.8* 70.2* 69.2*  PLT 312 283 289 313 309     HbA1C: Hemoglobin A1C  Date/Time Value Ref Range Status  06/28/2016 12:00 AM 6.6  Final   HB A1C (BAYER DCA - WAIVED)  Date/Time Value Ref Range Status  12/19/2023 04:24 PM 7.4 (H) 4.8 - 5.6 % Final    Comment:             Prediabetes: 5.7 - 6.4          Diabetes: >6.4          Glycemic control for adults with diabetes: <7.0   09/02/2023 01:35 PM 6.3 (H) 4.8 - 5.6 % Final    Comment:             Prediabetes: 5.7 - 6.4          Diabetes: >6.4  Glycemic control for adults with diabetes: <7.0    Hgb A1c MFr Bld  Date/Time Value Ref Range Status  03/24/2024 04:27 AM 7.6 (H) 4.8 - 5.6 % Final    Comment:    (NOTE) Diagnosis of Diabetes The following HbA1c ranges recommended by the American Diabetes Association (ADA) may be used as an aid in the diagnosis of diabetes mellitus.  Hemoglobin             Suggested A1C NGSP%              Diagnosis  <5.7                   Non Diabetic  5.7-6.4                Pre-Diabetic  >6.4                   Diabetic  <7.0                   Glycemic control for                       adults with diabetes.      Urinalysis: No results for input(s): "COLORURINE", "LABSPEC", "PHURINE", "GLUCOSEU", "HGBUR", "BILIRUBINUR", "KETONESUR", "PROTEINUR", "UROBILINOGEN", "NITRITE", "LEUKOCYTESUR" in the last 72 hours.  Invalid input(s): "APPERANCEUR"    Imaging: MR CARDIAC MORPHOLOGY W WO CONTRAST Result Date: 03/25/2024 CLINICAL DATA:  HFpEF, evaluate for infiltrative disease EXAM: CARDIAC MRI TECHNIQUE: The patient was scanned on a 1.5 Tesla Siemens magnet. A dedicated cardiac coil was used. Functional imaging was done using Fiesta sequences. 2,3, and 4 chamber views were done to assess for RWMA's. Modified  Simpson's rule using a short axis stack was used to calculate an ejection fraction on a dedicated work [employer]. The patient received 10 cc of Gadavist . After 10 minutes inversion recovery sequences were used to assess for infiltration and scar tissue. Velocity flow mapping performed in the ascending aorta and main pulmonary artery. CONTRAST:  10 cc  of Gadavist  FINDINGS: 1. Normal left ventricular size, thickness and systolic function (LVEF = 56%). There are no regional wall motion abnormalities. There is no late gadolinium enhancement in the left ventricular myocardium. LVEDV: 54 ml LVESV: 24 ml SV: 30 ml CO: 2.5 L/min Myocardial mass: 57g LV native T1 value 1021 ms (normal <1000 ms) LV ECV value 29% (normal <30%) 2. Normal right ventricular size, thickness and systolic function (RVEF = 52%). There are no regional wall motion abnormalities. 3. Moderately dilated left atrium, mildly dilated right atrial size. 4. Normal size of the aortic root, ascending aorta and pulmonary artery. 5. Mild to moderate mitral regurgitation. Aortic valve appears calcified with atleast moderate aortic valve stenosis visually. 6.  Normal pericardium.  small pericardial effusion. IMPRESSION: 1.  Normal LV size and systolic function.  LVEF 56%. 2.  No LGE or scar. 3.  Moderate aortic stenosis. 4.  Normal RV systolic function. 5.  No evidence for inflammatory disease. Electronically Signed   By: Constancia Delton M.D.   On: 03/25/2024 23:37   MR CARDIAC VELOCITY FLOW MAP Result Date: 03/25/2024 CLINICAL DATA:  HFpEF, evaluate for infiltrative disease EXAM: CARDIAC MRI TECHNIQUE: The patient was scanned on a 1.5 Tesla Siemens magnet. A dedicated cardiac coil was used. Functional imaging was done using Fiesta sequences. 2,3, and 4 chamber views were done to assess for RWMA's. Modified Simpson's rule using a short axis stack  was used to calculate an ejection fraction on a dedicated work [employer].  The patient received 10 cc of Gadavist . After 10 minutes inversion recovery sequences were used to assess for infiltration and scar tissue. Velocity flow mapping performed in the ascending aorta and main pulmonary artery. CONTRAST:  10 cc  of Gadavist  FINDINGS: 1. Normal left ventricular size, thickness and systolic function (LVEF = 56%). There are no regional wall motion abnormalities. There is no late gadolinium enhancement in the left ventricular myocardium. LVEDV: 54 ml LVESV: 24 ml SV: 30 ml CO: 2.5 L/min Myocardial mass: 57g LV native T1 value 1021 ms (normal <1000 ms) LV ECV value 29% (normal <30%) 2. Normal right ventricular size, thickness and systolic function (RVEF = 52%). There are no regional wall motion abnormalities. 3. Moderately dilated left atrium, mildly dilated right atrial size. 4. Normal size of the aortic root, ascending aorta and pulmonary artery. 5. Mild to moderate mitral regurgitation. Aortic valve appears calcified with atleast moderate aortic valve stenosis visually. 6.  Normal pericardium.  small pericardial effusion. IMPRESSION: 1.  Normal LV size and systolic function.  LVEF 56%. 2.  No LGE or scar. 3.  Moderate aortic stenosis. 4.  Normal RV systolic function. 5.  No evidence for inflammatory disease. Electronically Signed   By: Constancia Delton M.D.   On: 03/25/2024 23:37   MR CARDIAC VELOCITY FLOW MAP Result Date: 03/25/2024 CLINICAL DATA:  HFpEF, evaluate for infiltrative disease EXAM: CARDIAC MRI TECHNIQUE: The patient was scanned on a 1.5 Tesla Siemens magnet. A dedicated cardiac coil was used. Functional imaging was done using Fiesta sequences. 2,3, and 4 chamber views were done to assess for RWMA's. Modified Simpson's rule using a short axis stack was used to calculate an ejection fraction on a dedicated work [employer]. The patient received 10 cc of Gadavist . After 10 minutes inversion recovery sequences were used to assess for infiltration and scar  tissue. Velocity flow mapping performed in the ascending aorta and main pulmonary artery. CONTRAST:  10 cc  of Gadavist  FINDINGS: 1. Normal left ventricular size, thickness and systolic function (LVEF = 56%). There are no regional wall motion abnormalities. There is no late gadolinium enhancement in the left ventricular myocardium. LVEDV: 54 ml LVESV: 24 ml SV: 30 ml CO: 2.5 L/min Myocardial mass: 57g LV native T1 value 1021 ms (normal <1000 ms) LV ECV value 29% (normal <30%) 2. Normal right ventricular size, thickness and systolic function (RVEF = 52%). There are no regional wall motion abnormalities. 3. Moderately dilated left atrium, mildly dilated right atrial size. 4. Normal size of the aortic root, ascending aorta and pulmonary artery. 5. Mild to moderate mitral regurgitation. Aortic valve appears calcified with atleast moderate aortic valve stenosis visually. 6.  Normal pericardium.  small pericardial effusion. IMPRESSION: 1.  Normal LV size and systolic function.  LVEF 56%. 2.  No LGE or scar. 3.  Moderate aortic stenosis. 4.  Normal RV systolic function. 5.  No evidence for inflammatory disease. Electronically Signed   By: Constancia Delton M.D.   On: 03/25/2024 23:37     Medications:    milrinone  0.25 mcg/kg/min (03/27/24 1400)   norepinephrine  (LEVOPHED ) Adult infusion 6 mcg/min (03/27/24 1400)    allopurinol   100 mg Oral Daily   apixaban   5 mg Oral BID   Chlorhexidine  Gluconate Cloth  6 each Topical Daily   docusate sodium   100 mg Oral BID   feeding supplement  237 mL Oral BID  BM   folic acid   1 mg Oral Daily   furosemide   80 mg Intravenous BID   insulin  aspart  0-15 Units Subcutaneous TID WC   insulin  aspart  0-5 Units Subcutaneous QHS   insulin  aspart  3 Units Subcutaneous TID WC   insulin  glargine-yfgn  10 Units Subcutaneous BID   latanoprost   1 drop Both Eyes QHS   midodrine   10 mg Oral Q8H   pantoprazole   40 mg Oral BID   polyethylene glycol  17 g Oral Daily   potassium  chloride  40 mEq Oral BID   rosuvastatin   5 mg Oral Weekly   senna  1 tablet Oral Daily   sodium chloride  flush  10-40 mL Intracatheter Q12H    Assessment/ Plan:     78 year old female with history of hypertension, coronary artery disease, congestive heart failure, diabetes, peripheral vascular disease, gout, chronic kidney disease stage IIIb now admitted with history of shortness of breath and dyspnea on exertion with leg edema.  #1: Chronic kidney disease: Renal indices have been stable on her close to baseline.  #2: Hypokalemia: Hypokalemia most likely secondary to high-dose diuretics.  Supplement as ordered.  #3: Hyponatremia: Hyponatremia secondary to fluid overload leading to dilutional hyponatremia.  #4: Hypotension/cardiomyopathy: Patient has been on Lasix , norepinephrine  and also milrinone .  Continue.  Cardiology.  #5: Anemia: Anemia secondary to chronic kidney disease.  Continue iron .  #6: Diabetes: Continue insulin  as ordered.  Labs and medications reviewed. Will continue to follow along with you.   LOS: 8 Worthy Heads, MD [facility] 6/7/20252:40 PM

## 2024-03-27 NOTE — Plan of Care (Signed)
 Continuing with plan of care.

## 2024-03-27 NOTE — Plan of Care (Signed)
  Problem: Education: Goal: Ability to describe self-care measures that may prevent or decrease complications (Diabetes Survival Skills Education) will improve Outcome: Progressing   Problem: Coping: Goal: Ability to adjust to condition or change in health will improve Outcome: Progressing   Problem: Nutritional: Goal: Maintenance of adequate nutrition will improve Outcome: Progressing Goal: Progress toward achieving an optimal weight will improve Outcome: Progressing   Problem: Skin Integrity: Goal: Risk for impaired skin integrity will decrease Outcome: Progressing   Problem: Education: Goal: Knowledge of General Education information will improve Description: Including pain rating scale, medication(s)/side effects and non-pharmacologic comfort measures Outcome: Progressing   Problem: Clinical Measurements: Goal: Will remain free from infection Outcome: Progressing   Problem: Fluid Volume: Goal: Ability to maintain a balanced intake and output will improve Outcome: Not Progressing   Problem: Clinical Measurements: Goal: Diagnostic test results will improve Outcome: Not Progressing Goal: Respiratory complications will improve Outcome: Not Progressing Goal: Cardiovascular complication will be avoided Outcome: Not Progressing   Problem: Activity: Goal: Risk for activity intolerance will decrease Outcome: Not Progressing

## 2024-03-27 NOTE — Progress Notes (Signed)
 NAME:  Karen Farley, MRN:  [id number], DOB:  1946-04-28, LOS: 8 ADMISSION DATE:  03/19/2024, CONSULTATION DATE: 03/22/2024 REFERRING MD: Dr. Alvenia Aus, CHIEF COMPLAINT: Shortness of Breath   History of Present Illness:  This is a 78 yo female who presented to [facility] ER on 05/30 from home with abdominal distention, leg swelling, and shortness of breath with exertion onset of symptoms several weeks ago.  Pt reported she had been unable to tolerate laying flat at night due to shortness of breath.  At baseline she uses a walker to ambulate, but has only been able to take a few steps due to symptoms outlined above.  She also endorsed a 15 lb weight gain from her baseline over a 3 week period.  She has been compliant with her outpatient torsemide  and metolazone , and she recently has taken a double dose of her torsemide  recently.  Due to worsening symptoms pt proceeded to the ER for evaluation.  All hx obtained from EDP note.  ED Course Upon arrival to the ER significant lab results were: Na+ 127/glucose 159/BUN 76/creatinine 2.81/phosphorous 4.7/total bilirubin 2.1/BNP 265.4/troponin 26.  CT Chest revealed borderline cardiomegaly with mild diffuse pericardial thickening vs. small volume fluid; esophagitis vs. reflux; and suspected liver cirrhosis.  CXR negative for acute cardiopulmonary disease.  She received 40 mg iv lasix .  She was subsequently admitted to the telemetry unit for additional workup and treatment by hospitalist team.  See detailed hospital course below under significant events.    Pertinent  Medical History  Permanent Atrial Fibrillation  HFpEF CKD stage III Type I Diabetes Mellitus  HLD HTN  Lymphedema  Morbid Obesity  Osteopenia Pericardial Effusion   Significant Hospital Events: Including procedures, antibiotic start and stop dates in addition to other pertinent events   05/30: Pt admitted to the telemetry unit with acute on chronic respiratory failure secondary to acute on  chronic diastolic CHF  05/31: No significant ascites.  Lobular and heterogeneous appearance of the liver consistent        with history of cirrhosis.  Due to worsening renal failure lasix  held  05/31: Echo revealed 60 to 65%; echogenic material in the pericardium lateral to the RV apex that has been visualized on prior echo may represent fat pad vs. prior hemopericardium; small pericardial effusion without tamponade; mild mitral valve regurgitation; mild mitral stenosis; severe thickening of aortic valve  06/02: Right heart catheterization revealed severely elevated right atrial pressure, mild pulmonary hypertension, moderately elevated wedge pressure, and severely reduced cardiac output.  Pt transferred to ICU to start milrinone  gtt, iv diuresis, and levophed  gtt.  PCCM team consulted to assist with management.  06/03: Pt remains on levophed  and milrinone  gtts overnight UOP 1.3L  06/04: No significant events noted overnight.  Afebrile, remains on Milrinone  (0.25) and Levophed  (titrated down to 6 mcg), Coox is 66.4.  Creatinine slightly improved to 2.1 from 2.3, UOP 3.5 L last 24 hrs (net - 4.2 L) with aggressive diuresis.  Reports she was able to sleep with Tramadol  last night 06/05: No significant events noted overnight, afebrile. Remains on Milrinone  and Levophed  (7 mcg), Coox 63.9.  Creatinine improved to 1.8 from 2.1, UOP 2.2 L last 24 hrs (net - 5.9 L ) with diuresis, plan to continue per HF team.  Plan for Cardiac MRI to look for infiltrative disease.  Consult PT/OT 06/06: Pt remains on milrinone  and levophed  gtt @6  mcg/min  03/27/2024: Remains on 6 mcg of norepinephrine  and milrinone  infusion  Interim History /  Subjective:  Remains on low-dose norepinephrine  and milrinone  infusions.  No acute issues overnight.  Pts only complaint is constipation and gas.  Had a small bowel movement yesterday after bowel regimen.  States that she has not had a good bowel movement in a week.  Denies nausea and  vomiting but reports poor appetite.  Urine output remains optimal with diuresis.  On 80 mg twice a day of furosemide .  Cardiology and nephrology following.  She had a mild increase in her creatinine today.  Potassium was low at 3.1 and pharmacy is replacing.  Objective    Blood pressure 108/64, pulse (!) 101, temperature 97.6 F (36.4 C), temperature source Oral, resp. rate (!) 21, height 5' (1.524 m), weight 64.5 kg, SpO2 96%. CVP:  [11 mmHg-26 mmHg] 20 mmHg      Intake/Output Summary (Last 24 hours) at 03/27/2024 0833 Last data filed at 03/27/2024 0700 Gross per 24 hour  Intake 523.1 ml  Output 1680 ml  Net -1156.9 ml   Filed Weights   03/25/24 0500 03/26/24 0434 03/27/24 0500  Weight: 70.3 kg 70.3 kg 64.5 kg    Examination: General: Chronically-ill appearing female, NAD on RA  HENT: Supple, no JVD  Lungs: Bilateral breath sounds with no use of accessory muscles.   Cardiovascular: Irregular irregular, no r/g, 2+ radial/1+ distal pulses, 1+ bilateral lower extremity edema  Abdomen: +BS x4, obese, soft, non tender, non distended  Extremities: Moves all extremities  Skin: No rashes or lesions present  Neuro: Alert and oriented, following commands, PERRLA  GU: Indwelling foley catheter draining yellow urine   Resolved problem list   Assessment and Plan   #Acute chronic diastolic CHF  #Mild pulmonary hypertension  #Permanent atrial fibrillation  #Small pericardial effusion  #Mitral regurgitation  #Cardiorenal syndrome  Hx: Pericardial effusion, HLD, and HTN  RHC 03/22/2024: showed severely decreased cardiac output, biventricular failure with RV dysfunction.  - Continuous telemetry monitoring  - Cardiology and Heart Failure Team consulted appreciate input: diuresis and milrinone  gtt per recs  - Trend co-ox as needed - Prn levophed  gtt to maintain map 65 or higher - Continue milrinone  per cardiology recommendations - Continue outpatient apixaban  and rosuvastatin   - Daily  weights - Strict I's and O's and trend creatinine  #Hyponatremia-sodium level fluctuating between 125 and 128. 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  #Hypokalemia  - Trend BMP  - Potassium replaced; monitor and replace electrolytes as needed - Strict I&O's - Avoid nephrotoxic agents as able  - Nephrology consulted appreciate input   #Acute respiratory failure secondary to CHF exacerbation-resolved - Continue supplemental O2 for dyspnea and/or hypoxia  - Maintain O2 sats 92% or higher  - Prn bronchodilator therapy   #Constipation  - Continue aggressive bowel regimen - Will give 1 dose of magnesium  citrate x 1 today - Monitor bowel movements and consider abdominal imaging if no bowel movement with mag citrate  #H/o Cirrhosis  - Trend hepatic function panel as needed; will add LFTs tomorrow a.m. labs - Avoid hepatotoxic agents as able   #Type II diabetes mellitus  - CBG's ac/hs  - SSI, scheduled novolog , and scheduled semglee   - Follow hypo/hyperglycemic protocol  - Target range 140 to 180   Best Practice (right click and "Reselect all SmartList Selections" daily)  Diet/type: Regular consistency (see orders) DVT prophylaxis Apixaban   Pressure ulcer(s): N/A GI prophylaxis: PPI Lines: Yes and still needed  Foley: Yes and still needed  Code Status:  full code Last date  of multidisciplinary goals of care discussion [03/27/2024]  06/06: Updated pt at bedside regarding plan of care.   Labs   CBC: Recent Labs  Lab 03/23/24 0355 03/24/24 0427 03/25/24 0532 03/26/24 0509 03/27/24 0402  WBC 7.0 8.4 10.2 9.9 9.4  HGB 11.5* 12.0 11.5* 11.4* 11.5*  HCT 37.1 38.6 37.6 38.0 37.3  MCV 69.5* 69.2* 69.8* 70.2* 69.2*  PLT 312 283 289 313 309    Basic Metabolic Panel: Recent Labs  Lab 03/23/24 0355 03/23/24 1446 03/24/24 0427 03/25/24 0532 03/25/24 2232 03/26/24 0509 03/27/24 0402  NA 121*   < > 127* 127* 124* 128* 125*  K 2.9*   < > 3.4* 3.5 3.7 4.0 3.1*   CL 82*   < > 85* 88* 82* 87* 82*  CO2 24   < > 27 27 28 30 26   GLUCOSE 375*   < > 168* 239* 235* 168* 153*  BUN 67*   < > 70* 71* 70* 72* 73*  CREATININE 2.32*   < > 2.15* 1.85* 1.87* 1.89* 1.90*  CALCIUM  8.8*   < > 9.2 8.9 9.1 9.4 9.1  MG 2.3  --  2.4 2.3  --  2.6* 2.7*  PHOS  --   --  3.3 3.0  --   --   --    < > = values in this interval not displayed.   GFR: Estimated Creatinine Clearance: 20.5 mL/min (A) (by C-G formula based on SCr of 1.9 mg/dL (H)). Recent Labs  Lab 03/24/24 0427 03/25/24 0532 03/26/24 0509 03/27/24 0402  WBC 8.4 10.2 9.9 9.4    Liver Function Tests: Recent Labs  Lab 03/23/24 1446  AST 22  ALT 12  ALKPHOS 80  BILITOT 3.0*  PROT 6.4*  ALBUMIN  3.8   No results for input(s): "LIPASE", "AMYLASE" in the last 168 hours. No results for input(s): "AMMONIA" in the last 168 hours.  ABG    Component Value Date/Time   HCO3 26.9 03/22/2024 1118   HCO3 26.2 03/22/2024 1118   TCO2 28 03/22/2024 1118   TCO2 27 03/22/2024 1118   ACIDBASEDEF 8.7 (H) 04/11/2021 0210   O2SAT 67.9 03/27/2024 0500     Coagulation Profile: No results for input(s): "INR", "PROTIME" in the last 168 hours.  Cardiac Enzymes: No results for input(s): "CKTOTAL", "CKMB", "CKMBINDEX", "TROPONINI" in the last 168 hours.  HbA1C: Hemoglobin A1C  Date/Time Value Ref Range Status  06/28/2016 12:00 AM 6.6  Final   HB A1C (BAYER DCA - WAIVED)  Date/Time Value Ref Range Status  12/19/2023 04:24 PM 7.4 (H) 4.8 - 5.6 % Final    Comment:             Prediabetes: 5.7 - 6.4          Diabetes: >6.4          Glycemic control for adults with diabetes: <7.0   09/02/2023 01:35 PM 6.3 (H) 4.8 - 5.6 % Final    Comment:             Prediabetes: 5.7 - 6.4          Diabetes: >6.4          Glycemic control for adults with diabetes: <7.0    Hgb A1c MFr Bld  Date/Time Value Ref Range Status  03/24/2024 04:27 AM 7.6 (H) 4.8 - 5.6 % Final    Comment:    (NOTE) Diagnosis of Diabetes The  following HbA1c ranges recommended by the American Diabetes Association (ADA) may  be used as an aid in the diagnosis of diabetes mellitus.  Hemoglobin             Suggested A1C NGSP%              Diagnosis  <5.7                   Non Diabetic  5.7-6.4                Pre-Diabetic  >6.4                   Diabetic  <7.0                   Glycemic control for                       adults with diabetes.      CBG: Recent Labs  Lab 03/26/24 0736 03/26/24 1109 03/26/24 1646 03/26/24 2121 03/27/24 0802  GLUCAP 193* 220* 161* 151* 175*    Review of Systems: Positives in BOLD   Gen: Denies fever, chills, weight change, but reports generalized fatigue HEENT: Denies blurred vision, double vision, hearing loss, tinnitus, sinus congestion, rhinorrhea, sore throat, neck stiffness, dysphagia PULM: Reports improvement in shortness of breath with exertion and at rest, cough, sputum production, hemoptysis, wheezing CV: chest pain, mild edema, orthopnea, paroxysmal nocturnal dyspnea, palpitations GI: Denies abdominal pain, nausea, vomiting, diarrhea, hematochezia, melena, but reports constipation and gaseous distention of her abdomen, also reports of farting GU: Denies dysuria, hematuria, polyuria, oliguria, urethral discharge Endocrine: Denies hot or cold intolerance, polyuria, polyphagia or appetite change Derm: Denies rash, dry skin, scaling or peeling skin change Heme: Denies easy bruising, bleeding, bleeding gums Neuro: Denies headache, numbness, weakness, slurred speech, loss of memory or consciousness  Past Medical History:  She,  has a past medical history of A-fib (HCC), CHF (congestive heart failure) (HCC), Chronic heart failure with preserved ejection fraction (HFpEF) (HCC), 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), DDD (degenerative disc disease), lumbar, Degenerative disc disease, lumbar, Diabetes mellitus without complication (HCC), GIB (gastrointestinal bleeding),  Hyperlipidemia, Hypertension, Lymphedema, Morbid obesity (HCC), Osteoarthritis of both knees, Osteopenia, Pericardial effusion, Permanent atrial fibrillation (HCC), and Pleural effusion, left.   Surgical History:   Past Surgical History:  Procedure Laterality Date   BASAL CELL CARCINOMA EXCISION  11/01/2022   CATARACT EXTRACTION W/PHACO Left 10/09/2021   Procedure: CATARACT EXTRACTION PHACO AND INTRAOCULAR LENS PLACEMENT (IOC) LEFT DIABETIC 4.84 00:37.8;  Surgeon: Clair Crews, MD;  Location: [facility];  Service: Ophthalmology;  Laterality: Left;  Diabetic   CATARACT EXTRACTION W/PHACO Right 10/30/2021   Procedure: CATARACT EXTRACTION PHACO AND INTRAOCULAR LENS PLACEMENT (IOC) RIGHT DIABETIC 5.65 00:38.5;  Surgeon: Clair Crews, MD;  Location: [facility];  Service: Ophthalmology;  Laterality: Right;  Diabetic   CHOLECYSTECTOMY     DILATION AND CURETTAGE OF UTERUS     IR FLUORO GUIDE CV LINE RIGHT  08/16/2021   IR THORACENTESIS ASP PLEURAL SPACE W/IMG GUIDE  08/16/2021   PERICARDIOCENTESIS N/A 08/05/2021   Procedure: PERICARDIOCENTESIS;  Surgeon: Percival Brace, MD;  Location: [facility];  Service: Cardiovascular;  Laterality: N/A;   RIGHT HEART CATH N/A 03/22/2024   Procedure: RIGHT HEART CATH;  Surgeon: Wenona Hamilton, MD;  Location: [facility];  Service: Cardiovascular;  Laterality: N/A;   TEAR DUCT PROBING WITH STRABISMUS REPAIR Right    TONSILLECTOMY  Social History:   reports that she has never smoked. She has never used smokeless tobacco. She reports that she does not currently use alcohol. She reports that she does not use drugs.   Family History:  Her family history includes Breast cancer in her maternal grandmother and mother; Cancer in her brother and brother; Diabetes in her brother; Heart disease in her brother and mother; Heart disease (age of onset: 48) in her father; Osteoporosis in her mother; Parkinson's disease  in her brother.   Allergies No Known Allergies   Home Medications  Prior to Admission medications   Medication Sig Start Date End Date Taking? Authorizing Provider  allopurinol  (ZYLOPRIM ) 100 MG tablet Take 1 tablet (100 mg total) by mouth daily. 12/19/23  Yes Johnson, [name], DO  apixaban  (ELIQUIS ) 5 MG TABS tablet Take 1 tablet (5 mg total) by mouth 2 (two) times daily. 12/19/23  Yes Johnson, [name], DO  empagliflozin  (JARDIANCE ) 25 MG TABS tablet TAKE 1 TABLET BY MOUTH ONCE DAILY BEFOREBREAKFAST 12/19/23  Yes Johnson, [name], DO  folic acid  (FOLVITE ) 1 MG tablet Take 1 tablet (1 mg total) by mouth daily. 09/30/23  Yes Johnson, [name], DO  insulin  glargine, 2 Unit Dial, (TOUJEO  MAX SOLOSTAR) 300 UNIT/ML Solostar Pen Inject 16-20 Units into the skin daily. 12/19/23 09/14/24 Yes Johnson, [name], DO  latanoprost  (XALATAN ) 0.005 % ophthalmic solution Place 1 drop into both eyes at bedtime.   Yes [provider]  metolazone  (ZAROXOLYN ) 2.5 MG tablet Take 2.5 mg by mouth once a week. Wednesday   Yes [provider]  metoprolol  succinate (TOPROL -XL) 25 MG 24 hr tablet Take 0.5 tablets (12.5 mg total) by mouth daily. 12/19/23  Yes Johnson, [name], DO  midodrine  (PROAMATINE ) 10 MG tablet TAKE 1 TABLET BY MOUTH 3 TIMES DAILY WITH MEALS 02/24/23  Yes Gollan, Timothy J, MD  pantoprazole  (PROTONIX ) 40 MG tablet Take 1 tablet (40 mg total) by mouth 2 (two) times daily. 12/19/23  Yes Johnson, [name], DO  potassium chloride  SA (KLOR-CON  M) 20 MEQ tablet Take 2 tablets (40 mEq total) by mouth 2 (two) times daily. 12/19/23  Yes Johnson, [name], DO  rosuvastatin  (CRESTOR ) 5 MG tablet Take 1 tablet (5 mg total) by mouth once a week. 05/30/23  Yes Johnson, [name], DO  Semaglutide  (RYBELSUS ) 7 MG TABS Take 1 tablet (7 mg total) by mouth daily. 12/19/23  Yes Johnson, [name], DO  spironolactone  (ALDACTONE ) 50 MG tablet Take 1 tablet (50 mg total) by mouth daily. 12/19/23  Yes Johnson, [name], DO   torsemide  (DEMADEX ) 20 MG tablet TAKE 1 TO 2 TABLETS BY MOUTH DAILY. ONLY TAKE 2 TABLETS FOR 1 TO 3 DAYS AND IF PERSISTS, CALL WITH BAD SWELLING 12/19/23  Yes Johnson, [name], DO  Continuous Glucose Sensor (DEXCOM G7 SENSOR) MISC by Does not apply route.    [provider]  Glucagon  (GVOKE HYPOPEN  2-PACK) 0.5 MG/0.1ML SOAJ Inject 1 each into the skin daily as needed. Patient not taking: Reported on 03/19/2024 03/14/23   Terre Ferri P, DO  Insulin  Pen Needle (PEN NEEDLES) 32G X 4 MM MISC 1 each by Does not apply route 3 (three) times daily. Patient not taking: Reported on 02/24/2024 01/30/23   Terre Ferri P, DO  Insulin  Pen Needle (ULTRACARE PEN NEEDLES) 32G X 4 MM MISC USE 1 PEN ONCE DAILY Patient not taking: Reported on 02/24/2024 10/11/22   Terre Ferri P, DO  Insulin  Syringe-Needle U-100 (INSULIN   SYRINGE 1CC/31GX5/16") 31G X 5/16" 1 ML MISC 1 each by Does not apply route 3 (three) times daily. Patient not taking: Reported on 02/24/2024 12/13/22   Terre Ferri P, DO  Insulin  Syringe/Needle U-500 (BD INSULIN  SYRINGE U-500) 31G X 0.5 ML MISC 1 each by Does not apply route 3 (three) times daily as needed. Patient not taking: Reported on 02/24/2024 03/14/23   Terre Ferri P, DO  metroNIDAZOLE (METROGEL) 1 % gel Apply to face every morning. Patient not taking: Reported on 03/19/2024 09/19/22   [provider]  NEEDLE, DISP, 30 G (BD DISP NEEDLES) 30G X 1/2" MISC by Does not apply route. Patient not taking: Reported on 02/24/2024    [provider]     Critical care time: 40 minutes     Briannah Lona S. Tukov-Yual, ANP-BC Pulmonary and Critical Care Medicine Prefer epic messenger for cross cover needs or call ICU at [phone] If after hours, please call E-link.  NB: This document was prepared using Dragon voice recognition software and may include unintentional dictation errors.

## 2024-03-27 NOTE — Progress Notes (Signed)
 Physical Therapy Treatment Patient Details Name: Karen Farley MRN: [id number] DOB: Nov 29, 1945 Today's Date: 03/27/2024   History of Present Illness 78 y.o. female  who presents with SOB and worsening leg edema. Admitted with Acute Decompensated Biventricular Heart Failure/HFpEF, and moderate to severe Aortic Stenosis complicated by Cardiogenic Shock and Cardiorenal Syndrome requiring inotrope's and vasopressors. PMH of diastolic CHF, hypertension, hyperlipidemia, diabetes mellitus, PAD, gout, GI bleeding, CKD-3B, A-fib on Eliquis , chronic hyponatremia, chronic venous insufficiency, lymphedema.    PT Comments  Pt seen for PT tx with pt agreeable, husband arriving at end of session. Pt is able to complete bed mobility without assistance, sit<>stand with CGA with cuing re: hand placement. Pt ambulates around bed to toilet with RW & CGA with pt requesting to sit on toilet & nurse administer meds (laxatives). Pt would benefit from ongoing PT services to progress mobility as able.   If plan is discharge home, recommend the following: A little help with walking and/or transfers;A little help with bathing/dressing/bathroom;Assistance with cooking/housework;Assist for transportation;Help with stairs or ramp for entrance   Can travel by private vehicle        Equipment Recommendations  None recommended by PT    Recommendations for Other Services       Precautions / Restrictions Precautions Precautions: Fall Recall of Precautions/Restrictions: Intact Restrictions Weight Bearing Restrictions Per Provider Order: No     Mobility  Bed Mobility Overal bed mobility: Needs Assistance Bed Mobility: Supine to Sit     Supine to sit: Supervision, HOB elevated, Used rails          Transfers Overall transfer level: Needs assistance Equipment used: Rolling walker (2 wheels) Transfers: Sit to/from Stand Sit to Stand: Contact guard assist           General transfer comment: sit<>stand  from EOB, cuing re: hand placement to push to standing, reach back for seat prior to sitting    Ambulation/Gait Ambulation/Gait assistance: Contact guard assist Gait Distance (Feet): 16 Feet Assistive device: Rolling walker (2 wheels) Gait Pattern/deviations: Decreased step length - right, Decreased step length - left, Decreased stride length Gait velocity: decreased     General Gait Details: pushing RW slightly out in front of her   Stairs             Wheelchair Mobility     Tilt Bed    Modified Rankin (Stroke Patients Only)       Balance Overall balance assessment: Needs assistance Sitting-balance support: Feet supported Sitting balance-Leahy Scale: Good     Standing balance support: During functional activity, Bilateral upper extremity supported, Reliant on assistive device for balance Standing balance-Leahy Scale: Good                              Communication Communication Communication: No apparent difficulties  Cognition Arousal: Alert Behavior During Therapy: WFL for tasks assessed/performed   PT - Cognitive impairments: No apparent impairments                         Following commands: Intact      Cueing Cueing Techniques: Verbal cues  Exercises      General Comments General comments (skin integrity, edema, etc.): VSS, HR up to 120s with activity      Pertinent Vitals/Pain Pain Assessment Pain Assessment: Faces Faces Pain Scale: Hurts a little bit Pain Location: generalized - shoulder, back Pain Descriptors / Indicators: Discomfort Pain  Intervention(s): Monitored during session    Home Living                          Prior Function            PT Goals (current goals can now be found in the care plan section) Acute Rehab PT Goals Patient Stated Goal: to go home PT Goal Formulation: With patient Time For Goal Achievement: 04/08/24 Potential to Achieve Goals: Good Progress towards PT goals:  Progressing toward goals    Frequency    Min 2X/week      PT Plan      Co-evaluation              AM-PAC PT "6 Clicks" Mobility   Outcome Measure  Help needed turning from your back to your side while in a flat bed without using bedrails?: None Help needed moving from lying on your back to sitting on the side of a flat bed without using bedrails?: A Little Help needed moving to and from a bed to a chair (including a wheelchair)?: A Little Help needed standing up from a chair using your arms (e.g., wheelchair or bedside chair)?: A Little Help needed to walk in hospital room?: A Little Help needed climbing 3-5 steps with a railing? : A Little 6 Click Score: 19    End of Session   Activity Tolerance: Patient tolerated treatment well Patient left: with call bell/phone within reach (on toilet, husband in room, nurse entering room) Nurse Communication: Mobility status PT Visit Diagnosis: Other abnormalities of gait and mobility (R26.89);Unsteadiness on feet (R26.81);Muscle weakness (generalized) (M62.81)     Time: 1610-9604 PT Time Calculation (min) (ACUTE ONLY): 13 min  Charges:    $Therapeutic Activity: 8-22 mins PT General Charges $$ ACUTE PT VISIT: 1 Visit                     [name], PT, DPT 03/27/24, 11:01 AM   [name] 03/27/2024, 11:00 AM

## 2024-03-28 DIAGNOSIS — I5033 Acute on chronic diastolic (congestive) heart failure: Secondary | ICD-10-CM | POA: Diagnosis not present

## 2024-03-28 DIAGNOSIS — I272 Pulmonary hypertension, unspecified: Secondary | ICD-10-CM | POA: Diagnosis not present

## 2024-03-28 DIAGNOSIS — I3139 Other pericardial effusion (noninflammatory): Secondary | ICD-10-CM | POA: Diagnosis not present

## 2024-03-28 DIAGNOSIS — I4891 Unspecified atrial fibrillation: Secondary | ICD-10-CM | POA: Diagnosis not present

## 2024-03-28 LAB — COMPREHENSIVE METABOLIC PANEL WITH GFR
ALT: 16 U/L (ref 0–44)
AST: 21 U/L (ref 15–41)
Albumin: 3.4 g/dL — ABNORMAL LOW (ref 3.5–5.0)
Alkaline Phosphatase: 88 U/L (ref 38–126)
Anion gap: 17 — ABNORMAL HIGH (ref 5–15)
BUN: 79 mg/dL — ABNORMAL HIGH (ref 8–23)
CO2: 24 mmol/L (ref 22–32)
Calcium: 9.4 mg/dL (ref 8.9–10.3)
Chloride: 82 mmol/L — ABNORMAL LOW (ref 98–111)
Creatinine, Ser: 2.34 mg/dL — ABNORMAL HIGH (ref 0.44–1.00)
GFR, Estimated: 21 mL/min — ABNORMAL LOW (ref >60)
Glucose, Bld: 133 mg/dL — ABNORMAL HIGH (ref 70–99)
Potassium: 4.4 mmol/L (ref 3.5–5.1)
Sodium: 123 mmol/L — ABNORMAL LOW (ref 135–145)
Total Bilirubin: 2.6 mg/dL — ABNORMAL HIGH (ref 0.0–1.2)
Total Protein: 6.3 g/dL — ABNORMAL LOW (ref 6.5–8.1)

## 2024-03-28 LAB — CBC
HCT: 38.5 % (ref 36.0–46.0)
Hemoglobin: 11.9 g/dL — ABNORMAL LOW (ref 12.0–15.0)
MCH: 21.8 pg — ABNORMAL LOW (ref 26.0–34.0)
MCHC: 30.9 g/dL (ref 30.0–36.0)
MCV: 70.4 fL — ABNORMAL LOW (ref 80.0–100.0)
Platelets: 341 10*3/uL (ref 150–400)
RBC: 5.47 MIL/uL — ABNORMAL HIGH (ref 3.87–5.11)
RDW: 21.3 % — ABNORMAL HIGH (ref 11.5–15.5)
WBC: 8.4 10*3/uL (ref 4.0–10.5)
nRBC: 0.4 % — ABNORMAL HIGH (ref 0.0–0.2)

## 2024-03-28 LAB — GLUCOSE, CAPILLARY
Glucose-Capillary: 142 mg/dL — ABNORMAL HIGH (ref 70–99)
Glucose-Capillary: 71 mg/dL (ref 70–99)
Glucose-Capillary: 184 mg/dL — ABNORMAL HIGH (ref 70–99)
Glucose-Capillary: 250 mg/dL — ABNORMAL HIGH (ref 70–99)

## 2024-03-28 LAB — COOXEMETRY PANEL
Carboxyhemoglobin: 1.7 % — ABNORMAL HIGH (ref 0.5–1.5)
Methemoglobin: <0.7 % (ref 0.0–1.5)
O2 Saturation: 69.4 %
Total hemoglobin: 12.4 g/dL (ref 12.0–16.0)
Total oxygen content: 68.2 %

## 2024-03-28 MED ORDER — SODIUM CHLORIDE 0.9 % IV SOLN: INTRAVENOUS | Status: DC

## 2024-03-28 NOTE — Progress Notes (Signed)
 Cardiology Progress Note   Patient Name: Karen Farley Date of Encounter: 03/28/2024  Primary Cardiologist: Timothy Gollan, MD  Subjective   Breathing ok @ rest but still dyspneic w/ position changes/minimal activity.  No significant improvement overall.  No c/p.  Notes less response to lasix  yesterday, reduced UO.  Objective   Inpatient Medications    Scheduled Meds:  allopurinol   100 mg Oral Daily   apixaban   5 mg Oral BID   Chlorhexidine  Gluconate Cloth  6 each Topical Daily   docusate sodium   100 mg Oral BID   feeding supplement  237 mL Oral BID BM   folic acid   1 mg Oral Daily   furosemide   80 mg Intravenous BID   insulin  aspart  0-15 Units Subcutaneous TID WC   insulin  aspart  0-5 Units Subcutaneous QHS   insulin  aspart  3 Units Subcutaneous TID WC   insulin  glargine-yfgn  10 Units Subcutaneous BID   latanoprost   1 drop Both Eyes QHS   midodrine   10 mg Oral Q8H   pantoprazole   40 mg Oral BID   polyethylene glycol  17 g Oral Daily   rosuvastatin   5 mg Oral Weekly   senna  1 tablet Oral Daily   sodium chloride  flush  10-40 mL Intracatheter Q12H   Continuous Infusions:  milrinone  0.25 mcg/kg/min (03/28/24 0700)   norepinephrine  (LEVOPHED ) Adult infusion 7 mcg/min (03/28/24 0700)   PRN Meds: acetaminophen , albuterol , bisacodyl , dextromethorphan-guaiFENesin , ondansetron  (ZOFRAN ) IV, mouth rinse, oxyCODONE , sodium chloride  flush, traMADol , traZODone    Vital Signs    Vitals:   03/28/24 0530 03/28/24 0600 03/28/24 0630 03/28/24 0700  BP: (!) 95/57 (!) 86/58 (!) 91/57 94/64  Pulse: (!) 125 (!) 107 (!) 101 (!) 117  Resp: 17 17 14 17   Temp:      TempSrc:      SpO2: 93% 92% 93% 96%  Weight:      Height:        Intake/Output Summary (Last 24 hours) at 03/28/2024 0751 Last data filed at 03/28/2024 0700 Gross per 24 hour  Intake 264.92 ml  Output 680 ml  Net -415.08 ml   Filed Weights   03/25/24 0500 03/26/24 0434 03/27/24 0500  Weight: 70.3 kg 70.3 kg 64.5 kg     Physical Exam   GEN: Well nourished, well developed, in no acute distress.  HEENT: Grossly normal.  Neck: Supple, mod elev JVD, no carotid bruits, or masses. Cardiac: IR, IR, 2/6 syst murmur @ upper sternal borders.  No rubs or gallops. No clubbing, cyanosis, trace bilat LE edema.  Radials 2+, DP/PT 2+ and equal bilaterally.  Respiratory:  Respirations regular and unlabored, clear to auscultation bilaterally. GI: Soft, nontender, nondistended, BS + x 4. MS: no deformity or atrophy. Skin: warm and dry, no rash. Neuro:  Strength and sensation are intact. Psych: AAOx3.  Normal affect.  Labs    Chemistry Recent Labs  Lab 03/23/24 1446 03/24/24 0427 03/26/24 0509 03/27/24 0402 03/28/24 0526  NA 125*   < > 128* 125* 123*  K 4.1   < > 4.0 3.1* 4.4  CL 84*   < > 87* 82* 82*  CO2 25   < > 30 26 24   GLUCOSE 208*   < > 168* 153* 133*  BUN 69*   < > 72* 73* 79*  CREATININE 2.37*   < > 1.89* 1.90* 2.34*  CALCIUM  9.4   < > 9.4 9.1 9.4  PROT 6.4*  --   --   --  6.3*  ALBUMIN  3.8  --   --   --  3.4*  AST 22  --   --   --  21  ALT 12  --   --   --  16  ALKPHOS 80  --   --   --  88  BILITOT 3.0*  --   --   --  2.6*  GFRNONAA 21*   < > 27* 27* 21*  ANIONGAP 16*   < > 11 17* 17*   < > = values in this interval not displayed.     Hematology Recent Labs  Lab 03/26/24 0509 03/27/24 0402 03/28/24 0526  WBC 9.9 9.4 8.4  RBC 5.41* 5.39* 5.47*  HGB 11.4* 11.5* 11.9*  HCT 38.0 37.3 38.5  MCV 70.2* 69.2* 70.4*  MCH 21.1* 21.3* 21.8*  MCHC 30.0 30.8 30.9  RDW 20.5* 20.9* 21.3*  PLT 313 309 341    Cardiac Enzymes  Recent Labs  Lab 03/19/24 1038 03/19/24 1643  TROPONINIHS 26* 25*      BNP    Component Value Date/Time   BNP 265.4 (H) 03/19/2024 1038    Lipids  Lab Results  Component Value Date   CHOL 176 12/19/2023   HDL 53 12/19/2023   LDLCALC 111 (H) 12/19/2023   TRIG 61 12/19/2023    HbA1c  Lab Results  Component Value Date   HGBA1C 7.6 (H) 03/24/2024     Radiology    ----------   Telemetry    Afib, 90's to low 100's - trending higher past 24 hrs - Personally Reviewed  Cardiac Studies   2D Echocardiogram 5.31.2025   1. Intracavitary gradient. Peak velocity 2.31 m/s. Peak gradient 21.3  mmHg. Left ventricular ejection fraction, by estimation, is 60 to 65%. The  left ventricle has normal function. The left ventricle has no regional  wall motion abnormalities. There is  moderate concentric left ventricular hypertrophy.   2. Right ventricular systolic function is normal. The right ventricular  size is normal. There is normal pulmonary artery systolic pressure.   3. Mostly lateral to the RV. There is also echogenic material in the  pericardium lateral to the RV apex. This is visualized on prior echo as  well. May represent fat pad. However, it may also represent evidence of  prior hemopericardium. a small  pericardial effusion is present. There is no evidence of cardiac  tamponade.   4. Mobile MAC on the posterior mitral valve annulus. The mitral valve is  normal in structure. Mild mitral valve regurgitation. Mild mitral  stenosis. The mean mitral valve gradient is 3.5 mmHg with average heart  rate of 79 bpm. Severe mitral annular  calcification.   5. The aortic valve is normal in structure. There is severe calcifcation  of the aortic valve. There is severe thickening of the aortic valve.  Aortic valve regurgitation is not visualized. Mild aortic valve stenosis.  Aortic valve area, by VTI measures  1.10 cm. Aortic valve mean gradient measures 13.8 mmHg. Aortic valve Vmax  measures 2.57 m/s.   6. The inferior vena cava is normal in size with greater than 50%  respiratory variability, suggesting right atrial pressure of 3 mmHg.  _____________    Cardiac Catheterization  6.2.2025   RA: 18 mmHg RV: 38/5 with an EDP of 22 mmHg PW: 26 mmHg PA: 38/22 with a mean of 30 mmHg PA sat was 55%. Cardiac output was 3.06 with an index  of 1.85.   Recommendations: The patient has  evidence of biventricular failure with prominent RV dysfunction.  She likely has underlying cardiorenal syndrome and not able to diurese with some form of inotropic therapy.  Recommend starting small milrinone  drip and IV diuresis.   _____________    Cardiac MRI 6.5.2025   IMPRESSION: 1.  Normal LV size and systolic function.  LVEF 56%.  2.  No LGE or scar.  3.  Moderate aortic stenosis.  4.  Normal RV systolic function.  5.  No evidence for inflammatory disease. _____________    Patient Profile     78 y.o. female  with a hx of HFpEF, hypotension on midodrine , h/o pericardial effusion s/p pericardiocentesis, CKD stage 3, permanent Afib, HLD, lymphedema, h/o GIB, and diabetes, who was admitted w/ recurrent biventricular CHF (HFpEF).  Patient Profile     78 y.o. female  with a hx of HFpEF, hypotension on midodrine , h/o pericardial effusion s/p pericardiocentesis, CKD stage 3, permanent Afib, HLD, lymphedema, h/o GIB, and diabetes, who was admitted w/ recurrent biventricular CHF (HFpEF).   Assessment & Plan    1. Acute on Chronic Biventricular Heart Failure; HFpEF; Predominantly RV Failure - EF 60-65% with mod-severe AS normal RV on echo read, however RHC with low output biventricular heart failure - RHC 03/22/24: RA 18, PA 38/22 (30), PCW 26, CO/CI 3.06/1.85, PAPi 0.89 - cMRI 03/25/24: EF 56% RV ok moderate AS no LGE - on NE @ 6 - pressures predominantly 90's to low 100's.  Also on midodrine  10 tid. Co-ox 68.2%.  CVP trending 15-18 - only slightly improved trend over prior day. - minus 415 L yesterday and 10.7 L since admission.  Wt pending this AM (64.5 kg yesterday - lowest recorded wt dating back to 2015). -BUN/Creat higher this AM @ 79/2.34, up from 73/1.90.  CO2 stable @ 24. -Abd feels much better following BM x 2 yesterday.  Still w/ trace ankle edema and dyspnea w/ minimal activity. - Continue TED hose - With rising BUN/Creat, will hold  lasix  today.  May need RHC, though hypotension throughout the day yesterday/overnight may also be contributing to AKI.  Cont milrinone /norepi.  Will hold eliquis  tonight and keep NPO after MN in case RHC necessary tomorrow. 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     2. 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 Up to 2.8 on admission - BUN/Creat higher this AM @ 79/2.34, up from 73/1.90 yesterday.  Holding lasix  for now. - Cont norepi for BP support - avoid hypotension/ATN. - nephrology following.   3. Moderate to severe AS/moderate MR - stable   4. Small pericardial Effusion - h/o pericardial effusion s/p pericardiocentesis 700 ml in 07/2021. Bloody, neg for malignancy. - small w/o tamponade on echo - MRI ok   5. Atrial Fibrillation: Permanent - hold ? blocker with milrinone  and low output - rates trending higher - more freq elevations into low 100's. - continue Eliquis  5 mg bid   7. Hyponatremia - NA 123 this AM. -received tolvaptan  6/3. Consider re-dosing. -restrict free H2O.   8.Hypokalemia - Better this AM @ 4.4. - BPs soft, no spiro.   9. H/o cirrhosis - s/p paracentesis in 2022 - CT chest with suspected liver cirrhosis - suspect this was/is in the setting of cardiohepatic syndrome - AST/ALT nl on admit   10. Iron  deficiency anemia - s/p IV iron . -H/H stable.  11. Debility - Continue PT/OT - OOB to chair     Signed, Laneta Pintos, NP  03/28/2024, 7:51 AM    For questions or updates, please contact   Please consult [email or web address] for contact info under Cardiology/STEMI.

## 2024-03-28 NOTE — Progress Notes (Signed)
 PHARMACY CONSULT NOTE  Pharmacy Consult for Electrolyte Monitoring and Replacement   Recent Labs: Potassium (mmol/L)  Date Value  03/28/2024 4.4   Magnesium  (mg/dL)  Date Value  16/07/9603 2.7 (H)   Calcium  (mg/dL)  Date Value  54/06/8118 9.4   Albumin  (g/dL)  Date Value  14/78/2956 3.4 (L)  12/19/2023 4.1   Phosphorus (mg/dL)  Date Value  21/30/8657 3.0   Sodium (mmol/L)  Date Value  03/28/2024 123 (L)  12/19/2023 133 (L)   Assessment: 78 y.o. female with medical history significant of diastolic CHF, hypertension, hyperlipidemia, diabetes mellitus, PAD, gout, GI bleeding, CKD-3B, A-fib on Eliquis , chronic hyponatremia, chronic venous insufficiency, lymphedema, who presented with SOB and worsening leg edema. Pharmacy is asked to follow and replace electrolytes while in CCU  Relevant medications: Lasix  80 mg IV BID, milrinone , NE, midodrine   Goal of Therapy:  Electrolytes WNL  Plan:  --Na 123, down-trending. Defer management to cardiology / primary team --Re-check electrolytes with AM labs tomorrow AM  Page Boast 03/28/2024 7:00 AM

## 2024-03-28 NOTE — Plan of Care (Signed)
  Problem: Education: Goal: Ability to describe self-care measures that may prevent or decrease complications (Diabetes Survival Skills Education) will improve Outcome: Progressing   Problem: Coping: Goal: Ability to adjust to condition or change in health will improve Outcome: Progressing   Problem: Metabolic: Goal: Ability to maintain appropriate glucose levels will improve Outcome: Progressing   Problem: Skin Integrity: Goal: Risk for impaired skin integrity will decrease Outcome: Progressing   Problem: Fluid Volume: Goal: Ability to maintain a balanced intake and output will improve Outcome: Not Progressing

## 2024-03-28 NOTE — Progress Notes (Signed)
 NAME:  Karen Farley, MRN:  [id number], DOB:  [birth year], LOS: 9 ADMISSION DATE:  03/19/2024, CONSULTATION DATE: 03/22/2024 REFERRING MD: Dr. Alvenia Aus, CHIEF COMPLAINT: Shortness of Breath   History of Present Illness:  This is a 78 yo female who presented to [facility] ER on 05/30 from home with abdominal distention, leg swelling, and shortness of breath with exertion onset of symptoms several weeks ago.  Pt reported she had been unable to tolerate laying flat at night due to shortness of breath.  At baseline she uses a walker to ambulate, but has only been able to take a few steps due to symptoms outlined above.  She also endorsed a 15 lb weight gain from her baseline over a 3 week period.  She has been compliant with her outpatient torsemide  and metolazone , and she recently has taken a double dose of her torsemide  recently.  Due to worsening symptoms pt proceeded to the ER for evaluation.  All hx obtained from EDP note.  ED Course Upon arrival to the ER significant lab results were: Na+ 127/glucose 159/BUN 76/creatinine 2.81/phosphorous 4.7/total bilirubin 2.1/BNP 265.4/troponin 26.  CT Chest revealed borderline cardiomegaly with mild diffuse pericardial thickening vs. small volume fluid; esophagitis vs. reflux; and suspected liver cirrhosis.  CXR negative for acute cardiopulmonary disease.  She received 40 mg iv lasix .  She was subsequently admitted to the telemetry unit for additional workup and treatment by hospitalist team.  See detailed hospital course below under significant events.    Pertinent  Medical History  Permanent Atrial Fibrillation  HFpEF CKD stage III Type I Diabetes Mellitus  HLD HTN  Lymphedema  Morbid Obesity  Osteopenia Pericardial Effusion   Significant Hospital Events: Including procedures, antibiotic start and stop dates in addition to other pertinent events   05/30: Pt admitted to the telemetry unit with acute on chronic respiratory failure secondary to acute on  chronic diastolic CHF  05/31: No significant ascites.  Lobular and heterogeneous appearance of the liver consistent        with history of cirrhosis.  Due to worsening renal failure lasix  held  05/31: Echo revealed 60 to 65%; echogenic material in the pericardium lateral to the RV apex that has been visualized on prior echo may represent fat pad vs. prior hemopericardium; small pericardial effusion without tamponade; mild mitral valve regurgitation; mild mitral stenosis; severe thickening of aortic valve  06/02: Right heart catheterization revealed severely elevated right atrial pressure, mild pulmonary hypertension, moderately elevated wedge pressure, and severely reduced cardiac output.  Pt transferred to ICU to start milrinone  gtt, iv diuresis, and levophed  gtt.  PCCM team consulted to assist with management.  06/03: Pt remains on levophed  and milrinone  gtts overnight UOP 1.3L  06/04: No significant events noted overnight.  Afebrile, remains on Milrinone  (0.25) and Levophed  (titrated down to 6 mcg), Coox is 66.4.  Creatinine slightly improved to 2.1 from 2.3, UOP 3.5 L last 24 hrs (net - 4.2 L) with aggressive diuresis.  Reports she was able to sleep with Tramadol  last night 06/05: No significant events noted overnight, afebrile. Remains on Milrinone  and Levophed  (7 mcg), Coox 63.9.  Creatinine improved to 1.8 from 2.1, UOP 2.2 L last 24 hrs (net - 5.9 L ) with diuresis, plan to continue per HF team.  Plan for Cardiac MRI to look for infiltrative disease.  Consult PT/OT 06/06: Pt remains on milrinone  and levophed  gtt @6  mcg/min  06/07: Remains on 6 mcg of norepinephrine  and milrinone  infusion 06/08: Remain on levophed   and milrinone  gtts   Interim History / Subjective:  No acute events overnight currently on RA denies shortness of breath   Objective    Blood pressure 94/64, pulse (!) 117, temperature 97.9 F (36.6 C), temperature source Oral, resp. rate 17, height 5' (1.524 m), weight 64.5 kg,  SpO2 96%. CVP:  [4 mmHg-18 mmHg] 16 mmHg      Intake/Output Summary (Last 24 hours) at 03/28/2024 0735 Last data filed at 03/28/2024 0700 Gross per 24 hour  Intake 264.92 ml  Output 680 ml  Net -415.08 ml   Examination: General: Chronically-ill appearing female, NAD on RA  HENT: Supple, no JVD  Lungs: Clear throughout, even, non labored  Cardiovascular: Irregular irregular, no r/g, 2+ radial/1+ distal pulses, 2+ bilateral lower extremity edema  Abdomen: +BS x4, obese, soft, non tender, non distended  Extremities: Moves all extremities  Skin: No rashes or lesions present  Neuro: Alert and oriented, following commands, PERRLA  GU: Indwelling foley catheter draining yellow urine   Assessment and Plan:   #Acute chronic diastolic CHF  #Mild pulmonary hypertension  #Permanent atrial fibrillation  #Small pericardial effusion  #Mitral regurgitation  #Cardiorenal syndrome  Hx: Pericardial effusion, HLD, and HTN  RHC 03/22/2024: showed severely decreased cardiac output, biventricular failure with RV dysfunction.  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  #Hypokalemia  - Trend BMP  - Replete electrolytes as indicated  - Strict I&O's - Avoid nephrotoxic agents as able  - Nephrology consulted appreciate input    #Acute respiratory failure secondary to CHF exacerbation  - Supplemental O2 for dyspnea and/or hypoxia  - Maintain O2 sats 92% or higher  - Prn bronchodilator therapy    #Constipation  - Continue aggressive bowel regimen    #H/o Cirrhosis  - Trend hepatic function panel  - Avoid hepatotoxic agents as able    #Type II diabetes mellitus  - CBG's ac/hs  - SSI, scheduled novolog ,  and scheduled semglee   - Follow hypo/hyperglycemic protocol  - Target range 140 to 180   Best Practice (right click and "Reselect all SmartList Selections" daily)  Diet/type: Regular consistency (see orders) DVT prophylaxis Apixaban   Pressure ulcer(s): N/A GI prophylaxis: PPI Lines: Yes and still needed  Foley: Yes and still needed  Code Status:  full code Last date of multidisciplinary goals of care discussion [03/28/2024]  06/08: Updated pt at bedside regarding plan of care.   Labs   CBC: Recent Labs  Lab 03/24/24 0427 03/25/24 0532 03/26/24 0509 03/27/24 0402 03/28/24 0526  WBC 8.4 10.2 9.9 9.4 8.4  HGB 12.0 11.5* 11.4* 11.5* 11.9*  HCT 38.6 37.6 38.0 37.3 38.5  MCV 69.2* 69.8* 70.2* 69.2* 70.4*  PLT 283 289 313 309 341    Basic Metabolic Panel: Recent Labs  Lab 03/23/24 0355 03/23/24 1446 03/24/24 0427 03/25/24 0532 03/25/24 2232 03/26/24 0509 03/27/24 0402 03/28/24 0526  NA 121*   < > 127* 127* 124* 128* 125* 123*  K 2.9*   < > 3.4* 3.5 3.7 4.0 3.1* 4.4  CL 82*   < > 85* 88* 82* 87* 82* 82*  CO2 24   < > 27 27 28 30 26 24   GLUCOSE 375*   < >  168* 239* 235* 168* 153* 133*  BUN 67*   < > 70* 71* 70* 72* 73* 79*  CREATININE 2.32*   < > 2.15* 1.85* 1.87* 1.89* 1.90* 2.34*  CALCIUM  8.8*   < > 9.2 8.9 9.1 9.4 9.1 9.4  MG 2.3  --  2.4 2.3  --  2.6* 2.7*  --   PHOS  --   --  3.3 3.0  --   --   --   --    < > = values in this interval not displayed.   GFR: Estimated Creatinine Clearance: 16.6 mL/min (A) (by C-G formula based on SCr of 2.34 mg/dL (H)). Recent Labs  Lab 03/25/24 0532 03/26/24 0509 03/27/24 0402 03/28/24 0526  WBC 10.2 9.9 9.4 8.4    Liver Function Tests: Recent Labs  Lab 03/23/24 1446 03/28/24 0526  AST 22 21  ALT 12 16  ALKPHOS 80 88  BILITOT 3.0* 2.6*  PROT 6.4* 6.3*  ALBUMIN  3.8 3.4*   No results for input(s): "LIPASE", "AMYLASE" in the last 168 hours. No results for input(s): "AMMONIA" in the last 168 hours.  ABG     Component Value Date/Time   HCO3 26.9 03/22/2024 1118   HCO3 26.2 03/22/2024 1118   TCO2 28 03/22/2024 1118   TCO2 27 03/22/2024 1118   ACIDBASEDEF 8.7 (H) 04/11/2021 0210   O2SAT 69.4 03/28/2024 0526     Coagulation Profile: No results for input(s): "INR", "PROTIME" in the last 168 hours.  Cardiac Enzymes: No results for input(s): "CKTOTAL", "CKMB", "CKMBINDEX", "TROPONINI" in the last 168 hours.  HbA1C: Hemoglobin A1C  Date/Time Value Ref Range Status  06/28/2016 12:00 AM 6.6  Final   HB A1C (BAYER DCA - WAIVED)  Date/Time Value Ref Range Status  12/19/2023 04:24 PM 7.4 (H) 4.8 - 5.6 % Final    Comment:             Prediabetes: 5.7 - 6.4          Diabetes: >6.4          Glycemic control for adults with diabetes: <7.0   09/02/2023 01:35 PM 6.3 (H) 4.8 - 5.6 % Final    Comment:             Prediabetes: 5.7 - 6.4          Diabetes: >6.4          Glycemic control for adults with diabetes: <7.0    Hgb A1c MFr Bld  Date/Time Value Ref Range Status  03/24/2024 04:27 AM 7.6 (H) 4.8 - 5.6 % Final    Comment:    (NOTE) Diagnosis of Diabetes The following HbA1c ranges recommended by the American Diabetes Association (ADA) may be used as an aid in the diagnosis of diabetes mellitus.  Hemoglobin             Suggested A1C NGSP%              Diagnosis  <5.7                   Non Diabetic  5.7-6.4                Pre-Diabetic  >6.4                   Diabetic  <7.0                   Glycemic control for  adults with diabetes.      CBG: Recent Labs  Lab 03/27/24 0802 03/27/24 1246 03/27/24 1726 03/27/24 2128 03/28/24 0727  GLUCAP 175* 140* 120* 153* 142*    Review of Systems: Positives in BOLD   Gen: Denies fever, chills, weight change, but reports generalized fatigue HEENT: Denies blurred vision, double vision, hearing loss, tinnitus, sinus congestion, rhinorrhea, sore throat, neck stiffness, dysphagia PULM: Mild shortness of breath  with exertion and at rest, cough, sputum production, hemoptysis, wheezing CV: chest pain, mild edema, orthopnea, paroxysmal nocturnal dyspnea, palpitations GI: Denies abdominal pain, nausea, vomiting, diarrhea, hematochezia, melena, but reports constipation and gaseous distention of her abdomen, also reports of farting GU: Denies dysuria, hematuria, polyuria, oliguria, urethral discharge Endocrine: Denies hot or cold intolerance, polyuria, polyphagia or appetite change Derm: Denies rash, dry skin, scaling or peeling skin change Heme: Denies easy bruising, bleeding, bleeding gums Neuro: Denies headache, numbness, weakness, slurred speech, loss of memory or consciousness  Past Medical History:  She,  has a past medical history of A-fib (HCC), CHF (congestive heart failure) (HCC), Chronic heart failure with preserved ejection fraction (HFpEF) (HCC), 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   Surgical History:   Past Surgical History:  Procedure Laterality Date   BASAL CELL CARCINOMA EXCISION  11/01/2022   CATARACT EXTRACTION W/PHACO Left 10/09/2021   Procedure: CATARACT EXTRACTION PHACO AND INTRAOCULAR LENS PLACEMENT (IOC) LEFT DIABETIC 4.84 00:37.8;  Surgeon: Clair Crews, MD;  Location: [facility];  Service: Ophthalmology;  Laterality: Left;  Diabetic   CATARACT EXTRACTION W/PHACO Right 10/30/2021   Procedure: CATARACT EXTRACTION PHACO AND INTRAOCULAR LENS PLACEMENT (IOC) RIGHT DIABETIC 5.65 00:38.5;  Surgeon: Clair Crews, MD;  Location: [facility];  Service: Ophthalmology;  Laterality: Right;  Diabetic    CHOLECYSTECTOMY     DILATION AND CURETTAGE OF UTERUS     IR FLUORO GUIDE CV LINE RIGHT  08/16/2021   IR THORACENTESIS ASP PLEURAL SPACE W/IMG GUIDE  08/16/2021   PERICARDIOCENTESIS N/A 08/05/2021   Procedure: PERICARDIOCENTESIS;  Surgeon: Percival Brace, MD;  Location: [facility];  Service: Cardiovascular;  Laterality: N/A;   RIGHT HEART CATH N/A 03/22/2024   Procedure: RIGHT HEART CATH;  Surgeon: Wenona Hamilton, MD;  Location: [facility];  Service: Cardiovascular;  Laterality: N/A;   TEAR DUCT PROBING WITH STRABISMUS REPAIR Right    TONSILLECTOMY       Social History:   reports that she has never smoked. She has never used smokeless tobacco. She reports that she does not currently use alcohol. She reports that she does not use drugs.   Family History:  Her family history includes Breast cancer in her maternal grandmother and mother; Cancer in her brother and brother; Diabetes in her brother; Heart disease in her brother and mother; Heart disease (age of onset: 14) in her father; Osteoporosis in her mother; Parkinson's disease in her brother.   Allergies No Known Allergies   Home Medications  Prior to Admission medications   Medication Sig Start Date End Date Taking? Authorizing Provider  allopurinol  (ZYLOPRIM ) 100 MG tablet Take 1 tablet (100 mg total) by mouth daily. 12/19/23  Yes Johnson, [name], DO  apixaban  (ELIQUIS ) 5 MG TABS tablet Take 1 tablet (5 mg total)  by mouth 2 (two) times daily. 12/19/23  Yes Johnson, [name], DO  empagliflozin  (JARDIANCE ) 25 MG TABS tablet TAKE 1 TABLET BY MOUTH ONCE DAILY BEFOREBREAKFAST 12/19/23  Yes Johnson, [name], DO  folic acid  (FOLVITE ) 1 MG tablet Take 1 tablet (1 mg total) by mouth daily. 09/30/23  Yes Johnson, [name], DO  insulin  glargine, 2 Unit Dial, (TOUJEO  MAX SOLOSTAR) 300 UNIT/ML Solostar Pen Inject 16-20 Units into the skin daily. 12/19/23 09/14/24 Yes Johnson, [name], DO  latanoprost  (XALATAN ) 0.005 %  ophthalmic solution Place 1 drop into both eyes at bedtime.   Yes [provider]  metolazone  (ZAROXOLYN ) 2.5 MG tablet Take 2.5 mg by mouth once a week. Wednesday   Yes [provider]  metoprolol  succinate (TOPROL -XL) 25 MG 24 hr tablet Take 0.5 tablets (12.5 mg total) by mouth daily. 12/19/23  Yes Johnson, [name], DO  midodrine  (PROAMATINE ) 10 MG tablet TAKE 1 TABLET BY MOUTH 3 TIMES DAILY WITH MEALS 02/24/23  Yes Gollan, Timothy J, MD  pantoprazole  (PROTONIX ) 40 MG tablet Take 1 tablet (40 mg total) by mouth 2 (two) times daily. 12/19/23  Yes Johnson, [name], DO  potassium chloride  SA (KLOR-CON  M) 20 MEQ tablet Take 2 tablets (40 mEq total) by mouth 2 (two) times daily. 12/19/23  Yes Johnson, [name], DO  rosuvastatin  (CRESTOR ) 5 MG tablet Take 1 tablet (5 mg total) by mouth once a week. 05/30/23  Yes Johnson, [name], DO  Semaglutide  (RYBELSUS ) 7 MG TABS Take 1 tablet (7 mg total) by mouth daily. 12/19/23  Yes Johnson, [name], DO  spironolactone  (ALDACTONE ) 50 MG tablet Take 1 tablet (50 mg total) by mouth daily. 12/19/23  Yes Johnson, [name], DO  torsemide  (DEMADEX ) 20 MG tablet TAKE 1 TO 2 TABLETS BY MOUTH DAILY. ONLY TAKE 2 TABLETS FOR 1 TO 3 DAYS AND IF PERSISTS, CALL WITH BAD SWELLING 12/19/23  Yes Johnson, [name], DO  Continuous Glucose Sensor (DEXCOM G7 SENSOR) MISC by Does not apply route.    [provider]  Glucagon  (GVOKE HYPOPEN  2-PACK) 0.5 MG/0.1ML SOAJ Inject 1 each into the skin daily as needed. Patient not taking: Reported on 03/19/2024 03/14/23   Terre Ferri P, DO  Insulin  Pen Needle (PEN NEEDLES) 32G X 4 MM MISC 1 each by Does not apply route 3 (three) times daily. Patient not taking: Reported on 02/24/2024 01/30/23   Terre Ferri P, DO  Insulin  Pen Needle (ULTRACARE PEN NEEDLES) 32G X 4 MM MISC USE 1 PEN ONCE DAILY Patient not taking: Reported on 02/24/2024 10/11/22   Terre Ferri P, DO  Insulin  Syringe-Needle U-100 (INSULIN  SYRINGE 1CC/31GX5/16") 31G X  5/16" 1 ML MISC 1 each by Does not apply route 3 (three) times daily. Patient not taking: Reported on 02/24/2024 12/13/22   Terre Ferri P, DO  Insulin  Syringe/Needle U-500 (BD INSULIN  SYRINGE U-500) 31G X 0.5 ML MISC 1 each by Does not apply route 3 (three) times daily as needed. Patient not taking: Reported on 02/24/2024 03/14/23   Terre Ferri P, DO  metroNIDAZOLE (METROGEL) 1 % gel Apply to face every morning. Patient not taking: Reported on 03/19/2024 09/19/22   [provider]  NEEDLE, DISP, 30 G (BD DISP NEEDLES) 30G X 1/2" MISC by Does not apply route. Patient not taking: Reported on 02/24/2024    [provider]     Critical care time: 35 minutes     Janey Meek, AGNP  Pulmonary/Critical Care Pager [phone] (please  enter 7 digits) PCCM Consult Pager [phone] (please enter 7 digits)

## 2024-03-28 NOTE — H&P (View-Only) (Signed)
 Cardiology Progress Note   Patient Name: Karen Farley Date of Encounter: 03/28/2024  Primary Cardiologist: Timothy Gollan, MD  Subjective   Breathing ok @ rest but still dyspneic w/ position changes/minimal activity.  No significant improvement overall.  No c/p.  Notes less response to lasix  yesterday, reduced UO.  Objective   Inpatient Medications    Scheduled Meds:  allopurinol   100 mg Oral Daily   apixaban   5 mg Oral BID   Chlorhexidine  Gluconate Cloth  6 each Topical Daily   docusate sodium   100 mg Oral BID   feeding supplement  237 mL Oral BID BM   folic acid   1 mg Oral Daily   furosemide   80 mg Intravenous BID   insulin  aspart  0-15 Units Subcutaneous TID WC   insulin  aspart  0-5 Units Subcutaneous QHS   insulin  aspart  3 Units Subcutaneous TID WC   insulin  glargine-yfgn  10 Units Subcutaneous BID   latanoprost   1 drop Both Eyes QHS   midodrine   10 mg Oral Q8H   pantoprazole   40 mg Oral BID   polyethylene glycol  17 g Oral Daily   rosuvastatin   5 mg Oral Weekly   senna  1 tablet Oral Daily   sodium chloride  flush  10-40 mL Intracatheter Q12H   Continuous Infusions:  milrinone  0.25 mcg/kg/min (03/28/24 0700)   norepinephrine  (LEVOPHED ) Adult infusion 7 mcg/min (03/28/24 0700)   PRN Meds: acetaminophen , albuterol , bisacodyl , dextromethorphan-guaiFENesin , ondansetron  (ZOFRAN ) IV, mouth rinse, oxyCODONE , sodium chloride  flush, traMADol , traZODone    Vital Signs    Vitals:   03/28/24 0530 03/28/24 0600 03/28/24 0630 03/28/24 0700  BP: (!) 95/57 (!) 86/58 (!) 91/57 94/64  Pulse: (!) 125 (!) 107 (!) 101 (!) 117  Resp: 17 17 14 17   Temp:      TempSrc:      SpO2: 93% 92% 93% 96%  Weight:      Height:        Intake/Output Summary (Last 24 hours) at 03/28/2024 0751 Last data filed at 03/28/2024 0700 Gross per 24 hour  Intake 264.92 ml  Output 680 ml  Net -415.08 ml   Filed Weights   03/25/24 0500 03/26/24 0434 03/27/24 0500  Weight: 70.3 kg 70.3 kg 64.5 kg     Physical Exam   GEN: Well nourished, well developed, in no acute distress.  HEENT: Grossly normal.  Neck: Supple, mod elev JVD, no carotid bruits, or masses. Cardiac: IR, IR, 2/6 syst murmur @ upper sternal borders.  No rubs or gallops. No clubbing, cyanosis, trace bilat LE edema.  Radials 2+, DP/PT 2+ and equal bilaterally.  Respiratory:  Respirations regular and unlabored, clear to auscultation bilaterally. GI: Soft, nontender, nondistended, BS + x 4. MS: no deformity or atrophy. Skin: warm and dry, no rash. Neuro:  Strength and sensation are intact. Psych: AAOx3.  Normal affect.  Labs    Chemistry Recent Labs  Lab 03/23/24 1446 03/24/24 0427 03/26/24 0509 03/27/24 0402 03/28/24 0526  NA 125*   < > 128* 125* 123*  K 4.1   < > 4.0 3.1* 4.4  CL 84*   < > 87* 82* 82*  CO2 25   < > 30 26 24   GLUCOSE 208*   < > 168* 153* 133*  BUN 69*   < > 72* 73* 79*  CREATININE 2.37*   < > 1.89* 1.90* 2.34*  CALCIUM  9.4   < > 9.4 9.1 9.4  PROT 6.4*  --   --   --  6.3*  ALBUMIN  3.8  --   --   --  3.4*  AST 22  --   --   --  21  ALT 12  --   --   --  16  ALKPHOS 80  --   --   --  88  BILITOT 3.0*  --   --   --  2.6*  GFRNONAA 21*   < > 27* 27* 21*  ANIONGAP 16*   < > 11 17* 17*   < > = values in this interval not displayed.     Hematology Recent Labs  Lab 03/26/24 0509 03/27/24 0402 03/28/24 0526  WBC 9.9 9.4 8.4  RBC 5.41* 5.39* 5.47*  HGB 11.4* 11.5* 11.9*  HCT 38.0 37.3 38.5  MCV 70.2* 69.2* 70.4*  MCH 21.1* 21.3* 21.8*  MCHC 30.0 30.8 30.9  RDW 20.5* 20.9* 21.3*  PLT 313 309 341    Cardiac Enzymes  Recent Labs  Lab 03/19/24 1038 03/19/24 1643  TROPONINIHS 26* 25*      BNP    Component Value Date/Time   BNP 265.4 (H) 03/19/2024 1038    Lipids  Lab Results  Component Value Date   CHOL 176 12/19/2023   HDL 53 12/19/2023   LDLCALC 111 (H) 12/19/2023   TRIG 61 12/19/2023    HbA1c  Lab Results  Component Value Date   HGBA1C 7.6 (H) 03/24/2024     Radiology    ----------   Telemetry    Afib, 90's to low 100's - trending higher past 24 hrs - Personally Reviewed  Cardiac Studies   2D Echocardiogram 5.31.2025   1. Intracavitary gradient. Peak velocity 2.31 m/s. Peak gradient 21.3  mmHg. Left ventricular ejection fraction, by estimation, is 60 to 65%. The  left ventricle has normal function. The left ventricle has no regional  wall motion abnormalities. There is  moderate concentric left ventricular hypertrophy.   2. Right ventricular systolic function is normal. The right ventricular  size is normal. There is normal pulmonary artery systolic pressure.   3. Mostly lateral to the RV. There is also echogenic material in the  pericardium lateral to the RV apex. This is visualized on prior echo as  well. May represent fat pad. However, it may also represent evidence of  prior hemopericardium. a small  pericardial effusion is present. There is no evidence of cardiac  tamponade.   4. Mobile MAC on the posterior mitral valve annulus. The mitral valve is  normal in structure. Mild mitral valve regurgitation. Mild mitral  stenosis. The mean mitral valve gradient is 3.5 mmHg with average heart  rate of 79 bpm. Severe mitral annular  calcification.   5. The aortic valve is normal in structure. There is severe calcifcation  of the aortic valve. There is severe thickening of the aortic valve.  Aortic valve regurgitation is not visualized. Mild aortic valve stenosis.  Aortic valve area, by VTI measures  1.10 cm. Aortic valve mean gradient measures 13.8 mmHg. Aortic valve Vmax  measures 2.57 m/s.   6. The inferior vena cava is normal in size with greater than 50%  respiratory variability, suggesting right atrial pressure of 3 mmHg.  _____________    Cardiac Catheterization  6.2.2025   RA: 18 mmHg RV: 38/5 with an EDP of 22 mmHg PW: 26 mmHg PA: 38/22 with a mean of 30 mmHg PA sat was 55%. Cardiac output was 3.06 with an index  of 1.85.   Recommendations: The patient has  evidence of biventricular failure with prominent RV dysfunction.  She likely has underlying cardiorenal syndrome and not able to diurese with some form of inotropic therapy.  Recommend starting small milrinone  drip and IV diuresis.   _____________    Cardiac MRI 6.5.2025   IMPRESSION: 1.  Normal LV size and systolic function.  LVEF 56%.  2.  No LGE or scar.  3.  Moderate aortic stenosis.  4.  Normal RV systolic function.  5.  No evidence for inflammatory disease. _____________    Patient Profile     78 y.o. female  with a hx of HFpEF, hypotension on midodrine , h/o pericardial effusion s/p pericardiocentesis, CKD stage 3, permanent Afib, HLD, lymphedema, h/o GIB, and diabetes, who was admitted w/ recurrent biventricular CHF (HFpEF).  Patient Profile     78 y.o. female  with a hx of HFpEF, hypotension on midodrine , h/o pericardial effusion s/p pericardiocentesis, CKD stage 3, permanent Afib, HLD, lymphedema, h/o GIB, and diabetes, who was admitted w/ recurrent biventricular CHF (HFpEF).   Assessment & Plan    1. Acute on Chronic Biventricular Heart Failure; HFpEF; Predominantly RV Failure - EF 60-65% with mod-severe AS normal RV on echo read, however RHC with low output biventricular heart failure - RHC 03/22/24: RA 18, PA 38/22 (30), PCW 26, CO/CI 3.06/1.85, PAPi 0.89 - cMRI 03/25/24: EF 56% RV ok moderate AS no LGE - on NE @ 6 - pressures predominantly 90's to low 100's.  Also on midodrine  10 tid. Co-ox 68.2%.  CVP trending 15-18 - only slightly improved trend over prior day. - minus 415 L yesterday and 10.7 L since admission.  Wt pending this AM (64.5 kg yesterday - lowest recorded wt dating back to 2015). -BUN/Creat higher this AM @ 79/2.34, up from 73/1.90.  CO2 stable @ 24. -Abd feels much better following BM x 2 yesterday.  Still w/ trace ankle edema and dyspnea w/ minimal activity. - Continue TED hose - With rising BUN/Creat, will hold  lasix  today.  May need RHC, though hypotension throughout the day yesterday/overnight may also be contributing to AKI.  Cont milrinone /norepi.  Will hold eliquis  tonight and keep NPO after MN in case RHC necessary tomorrow. 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     2. 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 Up to 2.8 on admission - BUN/Creat higher this AM @ 79/2.34, up from 73/1.90 yesterday.  Holding lasix  for now. - Cont norepi for BP support - avoid hypotension/ATN. - nephrology following.   3. Moderate to severe AS/moderate MR - stable   4. Small pericardial Effusion - h/o pericardial effusion s/p pericardiocentesis 700 ml in 07/2021. Bloody, neg for malignancy. - small w/o tamponade on echo - MRI ok   5. Atrial Fibrillation: Permanent - hold ? blocker with milrinone  and low output - rates trending higher - more freq elevations into low 100's. - continue Eliquis  5 mg bid   7. Hyponatremia - NA 123 this AM. -received tolvaptan  6/3. Consider re-dosing. -restrict free H2O.   8.Hypokalemia - Better this AM @ 4.4. - BPs soft, no spiro.   9. H/o cirrhosis - s/p paracentesis in 2022 - CT chest with suspected liver cirrhosis - suspect this was/is in the setting of cardiohepatic syndrome - AST/ALT nl on admit   10. Iron  deficiency anemia - s/p IV iron . -H/H stable.  11. Debility - Continue PT/OT - OOB to chair     Signed, Laneta Pintos, NP  03/28/2024, 7:51 AM    For questions or updates, please contact   Please consult [email or web address] for contact info under Cardiology/STEMI.

## 2024-03-28 NOTE — Progress Notes (Signed)
 [facility]  PROGRESS NOTE   Subjective:   Patient is awake and alert.  Breathing much better.  Urine output yesterday was 550 cc.  Objective:  Vital signs: Blood pressure 98/66, pulse 60, temperature 97.8 F (36.6 C), temperature source Oral, resp. rate 19, height 5' (1.524 m), weight 64.5 kg, SpO2 92%.  Intake/Output Summary (Last 24 hours) at 03/28/2024 1041 Last data filed at 03/28/2024 1000 Gross per 24 hour  Intake 268.05 ml  Output 555 ml  Net -286.95 ml   Filed Weights   03/25/24 0500 03/26/24 0434 03/27/24 0500  Weight: 70.3 kg 70.3 kg 64.5 kg     Physical Exam: General:  No acute distress  Head:  Normocephalic, atraumatic. Moist oral mucosal membranes  Eyes:  Anicteric  Neck:  Supple  Lungs:   Clear to auscultation, normal effort  Heart:  S1S2 no rubs  Abdomen:   Soft, nontender, bowel sounds present  Extremities:  peripheral edema.  Neurologic:  Awake, alert, following commands  Skin:  No lesions  Access:     Basic Metabolic Panel: Recent Labs  Lab 03/23/24 0355 03/23/24 1446 03/24/24 0427 03/25/24 0532 03/25/24 2232 03/26/24 0509 03/27/24 0402 03/28/24 0526  NA 121*   < > 127* 127* 124* 128* 125* 123*  K 2.9*   < > 3.4* 3.5 3.7 4.0 3.1* 4.4  CL 82*   < > 85* 88* 82* 87* 82* 82*  CO2 24   < > 27 27 28 30 26 24   GLUCOSE 375*   < > 168* 239* 235* 168* 153* 133*  BUN 67*   < > 70* 71* 70* 72* 73* 79*  CREATININE 2.32*   < > 2.15* 1.85* 1.87* 1.89* 1.90* 2.34*  CALCIUM  8.8*   < > 9.2 8.9 9.1 9.4 9.1 9.4  MG 2.3  --  2.4 2.3  --  2.6* 2.7*  --   PHOS  --   --  3.3 3.0  --   --   --   --    < > = values in this interval not displayed.   GFR: Estimated Creatinine Clearance: 16.6 mL/min (A) (by C-G formula based on SCr of 2.34 mg/dL (H)).  Liver Function Tests: Recent Labs  Lab 03/23/24 1446 03/28/24 0526  AST 22 21  ALT 12 16  ALKPHOS 80 88  BILITOT 3.0* 2.6*  PROT 6.4* 6.3*  ALBUMIN  3.8 3.4*   No results for input(s):  "LIPASE", "AMYLASE" in the last 168 hours. No results for input(s): "AMMONIA" in the last 168 hours.  CBC: Recent Labs  Lab 03/24/24 0427 03/25/24 0532 03/26/24 0509 03/27/24 0402 03/28/24 0526  WBC 8.4 10.2 9.9 9.4 8.4  HGB 12.0 11.5* 11.4* 11.5* 11.9*  HCT 38.6 37.6 38.0 37.3 38.5  MCV 69.2* 69.8* 70.2* 69.2* 70.4*  PLT 283 289 313 309 341     HbA1C: Hemoglobin A1C  Date/Time Value Ref Range Status  06/28/2016 12:00 AM 6.6  Final   HB A1C (BAYER DCA - WAIVED)  Date/Time Value Ref Range Status  12/19/2023 04:24 PM 7.4 (H) 4.8 - 5.6 % Final    Comment:             Prediabetes: 5.7 - 6.4          Diabetes: >6.4          Glycemic control for adults with diabetes: <7.0   09/02/2023 01:35 PM 6.3 (H) 4.8 - 5.6 % Final    Comment:  Prediabetes: 5.7 - 6.4          Diabetes: >6.4          Glycemic control for adults with diabetes: <7.0    Hgb A1c MFr Bld  Date/Time Value Ref Range Status  03/24/2024 04:27 AM 7.6 (H) 4.8 - 5.6 % Final    Comment:    (NOTE) Diagnosis of Diabetes The following HbA1c ranges recommended by the American Diabetes Association (ADA) may be used as an aid in the diagnosis of diabetes mellitus.  Hemoglobin             Suggested A1C NGSP%              Diagnosis  <5.7                   Non Diabetic  5.7-6.4                Pre-Diabetic  >6.4                   Diabetic  <7.0                   Glycemic control for                       adults with diabetes.      Urinalysis: No results for input(s): "COLORURINE", "LABSPEC", "PHURINE", "GLUCOSEU", "HGBUR", "BILIRUBINUR", "KETONESUR", "PROTEINUR", "UROBILINOGEN", "NITRITE", "LEUKOCYTESUR" in the last 72 hours.  Invalid input(s): "APPERANCEUR"    Imaging: No results found.   Medications:    milrinone  0.25 mcg/kg/min (03/28/24 1000)   norepinephrine  (LEVOPHED ) Adult infusion 8 mcg/min (03/28/24 1000)    allopurinol   100 mg Oral Daily   Chlorhexidine  Gluconate Cloth  6  each Topical Daily   docusate sodium   100 mg Oral BID   feeding supplement  237 mL Oral BID BM   folic acid   1 mg Oral Daily   insulin  aspart  0-15 Units Subcutaneous TID WC   insulin  aspart  0-5 Units Subcutaneous QHS   insulin  aspart  3 Units Subcutaneous TID WC   insulin  glargine-yfgn  10 Units Subcutaneous BID   latanoprost   1 drop Both Eyes QHS   midodrine   10 mg Oral Q8H   pantoprazole   40 mg Oral BID   polyethylene glycol  17 g Oral Daily   rosuvastatin   5 mg Oral Weekly   senna  1 tablet Oral Daily   sodium chloride  flush  10-40 mL Intracatheter Q12H    Assessment/ Plan:     78 year old female with history of hypertension, coronary artery disease, congestive heart failure, diabetes, peripheral vascular disease, gout, chronic kidney disease stage IIIb now admitted with history of shortness of breath and dyspnea on exertion with leg edema.   #1: Chronic kidney disease: Renal indices are worsening.  Possibly due to ischemic nephropathy due to decreased perfusion.  Overall prognosis is guarded.  #2: Hypokalemia: Hypokalemia most likely secondary to high-dose diuretics.  Potassium supplemented yesterday.    #3: Hyponatremia: Hyponatremia secondary to fluid overload leading to dilutional hyponatremia.   #4: Hypotension/cardiomyopathy: History of right heart cath indicating severe cardiac disease leading to decreased cardiac output. Patient has been on Lasix , norepinephrine  and also milrinone .  Continue.  Cardiology recommendations.   #5: Anemia: Anemia secondary to chronic kidney disease.  Continue iron .   #6: Diabetes: Continue insulin  as ordered.  Labs and medications reviewed. Will continue to follow along with you.   LOS:  9 Worthy Heads, MD [facility] 6/8/202510:41 AM

## 2024-03-28 NOTE — Plan of Care (Signed)
 Continuing with plan of care.

## 2024-03-29 ENCOUNTER — Other Ambulatory Visit: Payer: Self-pay

## 2024-03-29 ENCOUNTER — Encounter
Admission: EM | Disposition: A | Discharge status: Hospice | Payer: Self-pay | Source: Home / Self Care | Attending: Internal Medicine

## 2024-03-29 DIAGNOSIS — I4821 Permanent atrial fibrillation: Secondary | ICD-10-CM | POA: Diagnosis not present

## 2024-03-29 DIAGNOSIS — I3139 Other pericardial effusion (noninflammatory): Secondary | ICD-10-CM | POA: Diagnosis not present

## 2024-03-29 DIAGNOSIS — I272 Pulmonary hypertension, unspecified: Secondary | ICD-10-CM | POA: Diagnosis not present

## 2024-03-29 DIAGNOSIS — I340 Nonrheumatic mitral (valve) insufficiency: Secondary | ICD-10-CM

## 2024-03-29 DIAGNOSIS — I5033 Acute on chronic diastolic (congestive) heart failure: Secondary | ICD-10-CM | POA: Diagnosis not present

## 2024-03-29 DIAGNOSIS — I4891 Unspecified atrial fibrillation: Secondary | ICD-10-CM

## 2024-03-29 DIAGNOSIS — I509 Heart failure, unspecified: Secondary | ICD-10-CM | POA: Diagnosis not present

## 2024-03-29 HISTORY — PX: RIGHT AND LEFT HEART CATH: CATH118262

## 2024-03-29 LAB — BASIC METABOLIC PANEL WITH GFR
Anion gap: 14 (ref 5–15)
Anion gap: 15 (ref 5–15)
BUN: 95 mg/dL — ABNORMAL HIGH (ref 8–23)
BUN: 93 mg/dL — ABNORMAL HIGH (ref 8–23)
CO2: 26 mmol/L (ref 22–32)
CO2: 24 mmol/L (ref 22–32)
Calcium: 9.1 mg/dL (ref 8.9–10.3)
Calcium: 8.7 mg/dL — ABNORMAL LOW (ref 8.9–10.3)
Chloride: 78 mmol/L — ABNORMAL LOW (ref 98–111)
Chloride: 79 mmol/L — ABNORMAL LOW (ref 98–111)
Creatinine, Ser: 3.07 mg/dL — ABNORMAL HIGH (ref 0.44–1.00)
Creatinine, Ser: 3.14 mg/dL — ABNORMAL HIGH (ref 0.44–1.00)
GFR, Estimated: 15 mL/min — ABNORMAL LOW (ref >60)
GFR, Estimated: 15 mL/min — ABNORMAL LOW (ref >60)
Glucose, Bld: 149 mg/dL — ABNORMAL HIGH (ref 70–99)
Glucose, Bld: 201 mg/dL — ABNORMAL HIGH (ref 70–99)
Potassium: 4.8 mmol/L (ref 3.5–5.1)
Potassium: 4.9 mmol/L (ref 3.5–5.1)
Sodium: 118 mmol/L — CL (ref 135–145)
Sodium: 118 mmol/L — CL (ref 135–145)

## 2024-03-29 LAB — POCT I-STAT 7, (LYTES, BLD GAS, ICA,H+H)
Acid-Base Excess: 3 mmol/L — ABNORMAL HIGH (ref 0.0–2.0)
Bicarbonate: 25.5 mmol/L (ref 20.0–28.0)
Calcium, Ion: 1.03 mmol/L — ABNORMAL LOW (ref 1.15–1.40)
HCT: 41 % (ref 36.0–46.0)
Hemoglobin: 13.9 g/dL (ref 12.0–15.0)
O2 Saturation: 94 %
Potassium: 4.4 mmol/L (ref 3.5–5.1)
Sodium: 121 mmol/L — ABNORMAL LOW (ref 135–145)
TCO2: 27 mmol/L (ref 22–32)
pCO2 arterial: 32.4 mmHg (ref 32–48)
pH, Arterial: 7.505 — ABNORMAL HIGH (ref 7.35–7.45)
pO2, Arterial: 65 mmHg — ABNORMAL LOW (ref 83–108)

## 2024-03-29 LAB — POCT I-STAT EG7
Acid-Base Excess: 5 mmol/L — ABNORMAL HIGH (ref 0.0–2.0)
Acid-Base Excess: 5 mmol/L — ABNORMAL HIGH (ref 0.0–2.0)
Bicarbonate: 29.4 mmol/L — ABNORMAL HIGH (ref 20.0–28.0)
Bicarbonate: 29.5 mmol/L — ABNORMAL HIGH (ref 20.0–28.0)
Calcium, Ion: 1.09 mmol/L — ABNORMAL LOW (ref 1.15–1.40)
Calcium, Ion: 1.09 mmol/L — ABNORMAL LOW (ref 1.15–1.40)
HCT: 42 % (ref 36.0–46.0)
HCT: 42 % (ref 36.0–46.0)
Hemoglobin: 14.3 g/dL (ref 12.0–15.0)
Hemoglobin: 14.3 g/dL (ref 12.0–15.0)
O2 Saturation: 62 %
O2 Saturation: 64 %
Potassium: 4.5 mmol/L (ref 3.5–5.1)
Potassium: 4.5 mmol/L (ref 3.5–5.1)
Sodium: 119 mmol/L — CL (ref 135–145)
Sodium: 119 mmol/L — CL (ref 135–145)
TCO2: 31 mmol/L (ref 22–32)
TCO2: 31 mmol/L (ref 22–32)
pCO2, Ven: 40.2 mmHg — ABNORMAL LOW (ref 44–60)
pCO2, Ven: 40.4 mmHg — ABNORMAL LOW (ref 44–60)
pH, Ven: 7.471 — ABNORMAL HIGH (ref 7.25–7.43)
pH, Ven: 7.472 — ABNORMAL HIGH (ref 7.25–7.43)
pO2, Ven: 30 mmHg — CL (ref 32–45)
pO2, Ven: 31 mmHg — CL (ref 32–45)

## 2024-03-29 LAB — MAGNESIUM: Magnesium: 3.6 mg/dL — ABNORMAL HIGH (ref 1.7–2.4)

## 2024-03-29 LAB — CBC
HCT: 36.8 % (ref 36.0–46.0)
Hemoglobin: 11.6 g/dL — ABNORMAL LOW (ref 12.0–15.0)
MCH: 21.7 pg — ABNORMAL LOW (ref 26.0–34.0)
MCHC: 31.5 g/dL (ref 30.0–36.0)
MCV: 68.9 fL — ABNORMAL LOW (ref 80.0–100.0)
Platelets: 358 10*3/uL (ref 150–400)
RBC: 5.34 MIL/uL — ABNORMAL HIGH (ref 3.87–5.11)
RDW: 21 % — ABNORMAL HIGH (ref 11.5–15.5)
WBC: 8.3 10*3/uL (ref 4.0–10.5)
nRBC: 0.5 % — ABNORMAL HIGH (ref 0.0–0.2)

## 2024-03-29 LAB — COOXEMETRY PANEL
Carboxyhemoglobin: 1.5 % (ref 0.5–1.5)
Methemoglobin: <0.7 % (ref 0.0–1.5)
O2 Saturation: 66.4 %
Total hemoglobin: 12.2 g/dL (ref 12.0–16.0)
Total oxygen content: 65 %

## 2024-03-29 LAB — SODIUM
Sodium: 118 mmol/L — CL (ref 135–145)
Sodium: 121 mmol/L — ABNORMAL LOW (ref 135–145)

## 2024-03-29 LAB — GLUCOSE, CAPILLARY
Glucose-Capillary: 169 mg/dL — ABNORMAL HIGH (ref 70–99)
Glucose-Capillary: 145 mg/dL — ABNORMAL HIGH (ref 70–99)
Glucose-Capillary: 251 mg/dL — ABNORMAL HIGH (ref 70–99)

## 2024-03-29 LAB — PHOSPHORUS: Phosphorus: 3.8 mg/dL (ref 2.5–4.6)

## 2024-03-29 SURGERY — RIGHT AND LEFT HEART CATH
Anesthesia: Moderate Sedation

## 2024-03-29 MED ORDER — ASPIRIN 81 MG PO CHEW: 81.0000 mg | CHEWABLE_TABLET | ORAL | Status: DC

## 2024-03-29 MED ORDER — VERAPAMIL HCL 2.5 MG/ML IV SOLN: INTRA_ARTERIAL | Status: DC | PRN

## 2024-03-29 MED ORDER — SODIUM CHLORIDE 3 % IV BOLUS
150.0000 mL | Freq: Once | INTRAVENOUS | Status: AC
  Administered 2024-03-29: 150 mL via INTRAVENOUS
  Filled 2024-03-29: qty 500

## 2024-03-29 MED ORDER — VERAPAMIL HCL 2.5 MG/ML IV SOLN
INTRAVENOUS | Status: AC
  Filled 2024-03-29: qty 2

## 2024-03-29 MED ORDER — HEPARIN (PORCINE) IN NACL 1000-0.9 UT/500ML-% IV SOLN
INTRAVENOUS | Status: DC | PRN
Start: 2024-03-29 — End: 2024-03-29
  Administered 2024-03-29 (×3): 500 mL

## 2024-03-29 MED ORDER — LIDOCAINE HCL 1 % IJ SOLN
INTRAMUSCULAR | Status: AC
  Filled 2024-03-29: qty 20

## 2024-03-29 MED ORDER — VERAPAMIL HCL 2.5 MG/ML IV SOLN
INTRAVENOUS | Status: DC | PRN
  Administered 2024-03-29: 2.5 mL via INTRA_ARTERIAL

## 2024-03-29 MED ORDER — HEPARIN SODIUM (PORCINE) 1000 UNIT/ML IJ SOLN
INTRAMUSCULAR | Status: AC
  Filled 2024-03-29: qty 10

## 2024-03-29 MED ORDER — LIDOCAINE HCL (PF) 1 % IJ SOLN
INTRAMUSCULAR | Status: DC | PRN
  Administered 2024-03-29: 5 mL

## 2024-03-29 MED ORDER — SODIUM CHLORIDE 0.9 % IV SOLN: INTRAVENOUS | Status: DC

## 2024-03-29 MED ORDER — FUROSEMIDE 10 MG/ML IJ SOLN
120.0000 mg | Freq: Once | INTRAVENOUS | Status: AC
  Administered 2024-03-29: 120 mg via INTRAVENOUS
  Filled 2024-03-29: qty 12

## 2024-03-29 SURGICAL SUPPLY — 13 items
CATH INFINITI 5FR ANG PIGTAIL (CATHETERS) IMPLANT
CATH INFINITI JR4 5F (CATHETERS) IMPLANT
CATH SWAN GANZ 7F STRAIGHT (CATHETERS) IMPLANT
DEVICE RAD TR BAND REGULAR (VASCULAR PRODUCTS) IMPLANT
DRAPE BRACHIAL (DRAPES) IMPLANT
GLIDESHEATH SLEND SS 6F .021 (SHEATH) IMPLANT
GLIDESHEATH SLENDER 7FR .021G (SHEATH) IMPLANT
GUIDEWIRE .025 260CM (WIRE) IMPLANT
GUIDEWIRE INQWIRE 1.5J.035X260 (WIRE) IMPLANT
KIT RIGHT HEART ACIST (MISCELLANEOUS) IMPLANT
PACK CARDIAC CATH (CUSTOM PROCEDURE TRAY) IMPLANT
SET ATX-X65L (MISCELLANEOUS) IMPLANT
STATION PROTECTION PRESSURIZED (MISCELLANEOUS) IMPLANT

## 2024-03-29 NOTE — Progress Notes (Addendum)
 [facility] [place], [place] 03/29/24  Subjective:   Hospital day # 10   Patient known to our practice from outpatient follow-up of CKD.  She presented to the emergency room with worsening shortness of breath with exertion.  Normally she is able to drive and carry on activities of daily living with ease.  For the last 2 to 3 weeks and especially last 4 days she has been getting short of breath and having to rest even to go from 1 room to the next.  She has developed worsening lower extremity edema.  She is now presented to the hospital for further management.  Update Patient seen and evaluated at bedside in ICU Ill-appearing, husband at bedside States she feels tired and fatigued today Remains on room air Trace lower extremity edema  Remains on Levophed  and milrinone    Renal: 06/08 0701 - 06/09 0700 In: 805.7 [P.O.:480; I.V.:325.7] Out: 350 [Urine:350] Lab Results  Component Value Date   CREATININE 3.07 (H) 03/29/2024   CREATININE 2.34 (H) 03/28/2024   CREATININE 1.90 (H) 03/27/2024     Objective:  Vital signs in last 24 hours:  Temp:  [97.8 F (36.6 C)-98 F (36.7 C)] 97.8 F (36.6 C) (06/09 0845) Pulse Rate:  [53-139] 111 (06/09 0945) Resp:  [14-32] 15 (06/09 0945) BP: (82-115)/(50-73) 99/59 (06/09 0930) SpO2:  [91 %-98 %] 94 % (06/09 0945) Weight:  [68.8 kg] 68.8 kg (06/09 0945)  Weight change:  Filed Weights   03/27/24 0500 03/29/24 0500 03/29/24 0945  Weight: 64.5 kg 68.8 kg 68.8 kg    Intake/Output:    Intake/Output Summary (Last 24 hours) at 03/29/2024 0959 Last data filed at 03/29/2024 0700 Gross per 24 hour  Intake 782.3 ml  Output 325 ml  Net 457.3 ml     Physical Exam: General: Ill-appearing  HEENT Moist mucous membranes  Pulm/lungs Normal breathing effort on room air  CVS/Heart Irregular rhythm  Abdomen:  Mildly distended  Extremities: trace pitting edema and lymphedema  Neurologic: Alert and oriented  Skin: No acute  rashes          Basic Metabolic Panel:  Recent Labs  Lab 03/24/24 0427 03/25/24 0532 03/25/24 2232 03/26/24 0509 03/27/24 0402 03/28/24 0526 03/29/24 0407 03/29/24 0902  NA 127* 127* 124* 128* 125* 123* 118* 118*  K 3.4* 3.5 3.7 4.0 3.1* 4.4 4.8  --   CL 85* 88* 82* 87* 82* 82* 78*  --   CO2 27 27 28 30 26 24 26   --   GLUCOSE 168* 239* 235* 168* 153* 133* 149*  --   BUN 70* 71* 70* 72* 73* 79* 95*  --   CREATININE 2.15* 1.85* 1.87* 1.89* 1.90* 2.34* 3.07*  --   CALCIUM  9.2 8.9 9.1 9.4 9.1 9.4 9.1  --   MG 2.4 2.3  --  2.6* 2.7*  --  3.6*  --   PHOS 3.3 3.0  --   --   --   --  3.8  --      CBC: Recent Labs  Lab 03/25/24 0532 03/26/24 0509 03/27/24 0402 03/28/24 0526 03/29/24 0407  WBC 10.2 9.9 9.4 8.4 8.3  HGB 11.5* 11.4* 11.5* 11.9* 11.6*  HCT 37.6 38.0 37.3 38.5 36.8  MCV 69.8* 70.2* 69.2* 70.4* 68.9*  PLT 289 313 309 341 358      Lab Results  Component Value Date   HEPBSAG NON REACTIVE 03/21/2024   HEPBSAB NON REACTIVE 04/11/2021   HEPBIGM NON REACTIVE 03/21/2024  Microbiology:  Recent Results (from the past 240 hours)  MRSA Next Gen by PCR, Nasal     Status: None   Collection Time: 03/22/24 12:53 PM   Specimen: Nasal Mucosa; Nasal Swab  Result Value Ref Range Status   MRSA by PCR Next Gen NOT DETECTED NOT DETECTED Final    Comment: (NOTE) The GeneXpert MRSA Assay (FDA approved for NASAL specimens only), is one component of a comprehensive MRSA colonization surveillance program. It is not intended to diagnose MRSA infection nor to guide or monitor treatment for MRSA infections. Test performance is not FDA approved in patients less than 4 years old. Performed at [facility], [address] [place], [place] [address]     Coagulation Studies: No results for input(s): "LABPROT", "INR" in the last 72 hours.  Urinalysis: No results for input(s): "COLORURINE", "LABSPEC", "PHURINE", "GLUCOSEU", "HGBUR", "BILIRUBINUR",  "KETONESUR", "PROTEINUR", "UROBILINOGEN", "NITRITE", "LEUKOCYTESUR" in the last 72 hours.  Invalid input(s): "APPERANCEUR"    Imaging: No results found.     Medications:    sodium chloride      [START ON 03/30/2024] sodium chloride      milrinone  0.25 mcg/kg/min (03/29/24 0700)   norepinephrine  (LEVOPHED ) Adult infusion 9 mcg/min (03/29/24 0700)   sodium chloride  3% (hypertonic) 150 mL (03/29/24 0935)    allopurinol   100 mg Oral Daily   [START ON 03/30/2024] aspirin   81 mg Oral Pre-Cath   Chlorhexidine  Gluconate Cloth  6 each Topical Daily   docusate sodium   100 mg Oral BID   feeding supplement  237 mL Oral BID BM   folic acid   1 mg Oral Daily   insulin  aspart  0-15 Units Subcutaneous TID WC   insulin  aspart  0-5 Units Subcutaneous QHS   insulin  aspart  3 Units Subcutaneous TID WC   insulin  glargine-yfgn  10 Units Subcutaneous BID   latanoprost   1 drop Both Eyes QHS   midodrine   10 mg Oral Q8H   pantoprazole   40 mg Oral BID   polyethylene glycol  17 g Oral Daily   rosuvastatin   5 mg Oral Weekly   senna  1 tablet Oral Daily   sodium chloride  flush  10-40 mL Intracatheter Q12H   acetaminophen , albuterol , bisacodyl , dextromethorphan-guaiFENesin , ondansetron  (ZOFRAN ) IV, mouth rinse, oxyCODONE , sodium chloride  flush, traMADol , traZODone   Assessment/ Plan:  78 y.o. female with  multiple medical problems including chronic kidney disease, hypertension, atrial fibrillation, hyperlipidemia, chronic lower extremity edema, diastolic dysfunction, diabetes, osteoarthritis of the knee, chronic back pain  admitted on 03/19/2024 for Shortness of breath [R06.02] Acute on chronic diastolic heart failure (HCC) [I50.33] Acute on chronic diastolic CHF (congestive heart failure) (HCC) [I50.33]  1.  Acute on chronic diastolic CHF, with worsening lower extremity edema and shortness of breath 2.  Acute kidney injury on chronic kidney disease stage IV.  Baseline creatinine 2.6/GFR 19 from April  2025. Creatinine has worsened to 2.65, likely secondary to aggressive diuresis. 3.  Hypokalemia 4.  Hyponatremia. 5.  Diabetes type 2 with CKD.  Hemoglobin A1c 7.4% from February 2025. 6.  Constipation  2D echo 03/20/2024-LVEF 60 to 65%, moderate concentric LVH, normal right ventricular systolic function small pericardial effusion, normal aortic valve, severe mitral annular calcification  Plan: RHC on 6/2 showed severely decreased cardiac output, biventricular failure with RV dysfunction. Cardiology feels this is end stage diastolic heart failure. Milrinone  drip with IV Furosemide .  Cardiology planning repeat RHC/LHC today to determine cardiac restriction versus pericardial constrictive process. IV furosemide  healed in setting of worsening kidney  function. Potassium corrected  Sodium decreased to 118.  Have received tolvaptan  during this admission. Continue Ensure supplementation twice daily Primary team to continue management of SSI  Continue bowel regimen.   LOS: 10 Anola King 6/9/20259:59 AM  [address] [place], [place] [phone]

## 2024-03-29 NOTE — Plan of Care (Signed)
  Problem: Education: Goal: Ability to describe self-care measures that may prevent or decrease complications (Diabetes Survival Skills Education) will improve Outcome: Progressing   Problem: Coping: Goal: Ability to adjust to condition or change in health will improve Outcome: Progressing   Problem: Metabolic: Goal: Ability to maintain appropriate glucose levels will improve Outcome: Progressing   Problem: Nutritional: Goal: Maintenance of adequate nutrition will improve Outcome: Progressing Goal: Progress toward achieving an optimal weight will improve Outcome: Progressing   Problem: Skin Integrity: Goal: Risk for impaired skin integrity will decrease Outcome: Progressing   Problem: Tissue Perfusion: Goal: Adequacy of tissue perfusion will improve Outcome: Progressing   Problem: Fluid Volume: Goal: Ability to maintain a balanced intake and output will improve Outcome: Not Progressing

## 2024-03-29 NOTE — Progress Notes (Signed)
 OT Cancellation Note  Patient Details Name: Karen Farley MRN: [id number] DOB: 13-Jul-1946   Cancelled Treatment:    Reason Eval/Treat Not Completed: Medical issues which prohibited therapy. Chart reviewed and pt noted with low sodium at 118 this date. She is currently down for a right and left heart cath. Will re-attempt once medically stable.  Jalia Zuniga E Sallee Hogrefe 03/29/2024, 1:23 PM

## 2024-03-29 NOTE — Progress Notes (Addendum)
 NAME:  METTIE ROYLANCE, MRN:  [id number], DOB:  May 24, 1946, LOS: 10 ADMISSION DATE:  03/19/2024, CONSULTATION DATE: 03/22/2024 REFERRING MD: Dr. Alvenia Aus, CHIEF COMPLAINT: Shortness of Breath   History of Present Illness:  This is a 78 yo female who presented to [facility] ER on 05/30 from home with abdominal distention, leg swelling, and shortness of breath with exertion onset of symptoms several weeks ago.  Pt reported she had been unable to tolerate laying flat at night due to shortness of breath.  At baseline she uses a walker to ambulate, but has only been able to take a few steps due to symptoms outlined above.  She also endorsed a 15 lb weight gain from her baseline over a 3 week period.  She has been compliant with her outpatient torsemide  and metolazone , and she recently has taken a double dose of her torsemide  recently.  Due to worsening symptoms pt proceeded to the ER for evaluation.  All hx obtained from EDP note.  ED Course Upon arrival to the ER significant lab results were: Na+ 127/glucose 159/BUN 76/creatinine 2.81/phosphorous 4.7/total bilirubin 2.1/BNP 265.4/troponin 26.  CT Chest revealed borderline cardiomegaly with mild diffuse pericardial thickening vs. small volume fluid; esophagitis vs. reflux; and suspected liver cirrhosis.  CXR negative for acute cardiopulmonary disease.  She received 40 mg iv lasix .  She was subsequently admitted to the telemetry unit for additional workup and treatment by hospitalist team.  See detailed hospital course below under significant events.    Pertinent  Medical History  Permanent Atrial Fibrillation  HFpEF CKD stage III Type I Diabetes Mellitus  HLD HTN  Lymphedema  Morbid Obesity  Osteopenia Pericardial Effusion   Significant Hospital Events: Including procedures, antibiotic start and stop dates in addition to other pertinent events   05/30: Pt admitted to the telemetry unit with acute on chronic respiratory failure secondary to acute on  chronic diastolic CHF  05/31: No significant ascites.  Lobular and heterogeneous appearance of the liver consistent        with history of cirrhosis.  Due to worsening renal failure lasix  held  05/31: Echo revealed 60 to 65%; echogenic material in the pericardium lateral to the RV apex that has been visualized on prior echo may represent fat pad vs. prior hemopericardium; small pericardial effusion without tamponade; mild mitral valve regurgitation; mild mitral stenosis; severe thickening of aortic valve  06/02: Right heart catheterization revealed severely elevated right atrial pressure, mild pulmonary hypertension, moderately elevated wedge pressure, and severely reduced cardiac output.  Pt transferred to ICU to start milrinone  gtt, iv diuresis, and levophed  gtt.  PCCM team consulted to assist with management.  06/03: Pt remains on levophed  and milrinone  gtts overnight UOP 1.3L  06/04: No significant events noted overnight.  Afebrile, remains on Milrinone  (0.25) and Levophed  (titrated down to 6 mcg), Coox is 66.4.  Creatinine slightly improved to 2.1 from 2.3, UOP 3.5 L last 24 hrs (net - 4.2 L) with aggressive diuresis.  Reports she was able to sleep with Tramadol  last night 06/05: No significant events noted overnight, afebrile. Remains on Milrinone  and Levophed  (7 mcg), Coox 63.9.  Creatinine improved to 1.8 from 2.1, UOP 2.2 L last 24 hrs (net - 5.9 L ) with diuresis, plan to continue per HF team.  Plan for Cardiac MRI to look for infiltrative disease.  Consult PT/OT 06/06: Pt remains on milrinone  and levophed  gtt @6  mcg/min  06/07: Remains on 6 mcg of norepinephrine  and milrinone  infusion 06/08: Remain on levophed   and milrinone  gtts  06/09: Worsening kidney function; due for right heart catheterization today  Interim History / Subjective:  Reports feeling fatigued.  Worsening kidney function.  Due for right heart catheterization today.  Denies chest pain, palpitations, nausea and vomiting.   Reports improvement in abdominal bloating and constipation.  Had 2 bowel movements on Saturday.  None yesterday and today.  Furosemide  discontinued.  Urine output dropped to 350 mL in the last 24 hours. Objective    Blood pressure 92/61, pulse (!) 108, temperature 98 F (36.7 C), temperature source Oral, resp. rate 14, height 5' (1.524 m), weight 68.8 kg, SpO2 95%. CVP:  [8 mmHg-22 mmHg] 10 mmHg      Intake/Output Summary (Last 24 hours) at 03/29/2024 0814 Last data filed at 03/29/2024 0700 Gross per 24 hour  Intake 793.98 ml  Output 350 ml  Net 443.98 ml   Examination: General: Chronically-ill appearing female, NAD on RA  HEENT: Normocephalic and atraumatic, oral mucosa moist, PERRLA, neck is supple, no JVD  Lungs: Normal work of breathing, bilateral breath sounds in all 4 quadrants, diminished in the bases cardiovascular: Irregular irregular, no r/g, 2+ radial/1+ distal pulses, +1 nonpitting bilateral lower extremity edema  Abdomen: Abdomen is mildly distended, tympanic on percussion, +BS x4, obese, soft, non tender, palpation reveals no organomegaly Extremities: Moves all extremities, no joint deformities  Skin: No rashes or lesions present  Neuro: Alert and oriented, following commands, PERRLA  GU: Indwelling foley catheter draining yellow urine   Assessment and Plan:  #Acute chronic diastolic CHF  #Mild pulmonary hypertension  #Permanent atrial fibrillation  #Small pericardial effusion  #Mitral regurgitation  #Cardiorenal syndrome  Hx: Pericardial effusion, HLD, and HTN  RHC 03/22/2024: showed severely decreased cardiac output, biventricular failure with RV dysfunction.-Due for repeat right heart catheterization today -Due for repeat right heart catheterization today - Continuous telemetry monitoring  - Cardiology and Heart Failure teams following - Trend co-ox  - Prn levophed  gtt to maintain map 65 or higher  - Continue outpatient apixaban  and rosuvastatin   - Daily weights     #Hyponatremia-sodium level 118 this morning 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   #Acute respiratory failure secondary to CHF exacerbation-resolved -As needed supplemental O2 for dyspnea and/or hypoxia  - Maintain O2 sats 92% or higher  - Prn bronchodilator therapy    #Constipation-improved - Continue aggressive bowel regimen  -High-fiber diet as tolerated   #H/o Cirrhosis  - Trend hepatic function panel  - Avoid hepatotoxic agents as able    #Type II diabetes mellitus  - CBG's ac/hs  - SSI, scheduled novolog , and scheduled semglee   - Follow hypo/hyperglycemic protocol  - Target range 140 to 180   Best Practice (right click and "Reselect all SmartList Selections" daily)  Diet/type: Regular consistency (see orders) DVT prophylaxis Apixaban   Pressure ulcer(s): N/A GI prophylaxis: PPI Lines: Yes and still needed  Foley: Yes and still needed  Code Status:  full code Last date of multidisciplinary goals of care discussion [03/29/2024]  06/09: Updated pt at bedside regarding plan of care.  Will discuss plan for CRRT and obtain consent from patient once she returns from her right  heart catheterization Labs   CBC: Recent Labs  Lab 03/25/24 0532 03/26/24 0509 03/27/24 0402 03/28/24 0526 03/29/24 0407  WBC 10.2 9.9 9.4 8.4 8.3  HGB 11.5* 11.4* 11.5* 11.9* 11.6*  HCT 37.6 38.0 37.3 38.5 36.8  MCV 69.8* 70.2* 69.2* 70.4* 68.9*  PLT 289 313 309 341 358    Basic Metabolic Panel: Recent Labs  Lab 03/24/24 0427 03/25/24 0532 03/25/24 2232 03/26/24 0509 03/27/24 0402 03/28/24 0526  03/29/24 0407  NA 127* 127* 124* 128* 125* 123* 118*  K 3.4* 3.5 3.7 4.0 3.1* 4.4 4.8  CL 85* 88* 82* 87* 82* 82* 78*  CO2 27 27 28 30 26 24 26   GLUCOSE 168* 239* 235* 168* 153* 133* 149*  BUN 70* 71* 70* 72* 73* 79* 95*  CREATININE 2.15* 1.85* 1.87* 1.89* 1.90* 2.34* 3.07*  CALCIUM  9.2 8.9 9.1 9.4 9.1 9.4 9.1  MG 2.4 2.3  --  2.6* 2.7*  --  3.6*  PHOS 3.3 3.0  --   --   --   --  3.8   GFR: Estimated Creatinine Clearance: 13.1 mL/min (A) (by C-G formula based on SCr of 3.07 mg/dL (H)). Recent Labs  Lab 03/26/24 0509 03/27/24 0402 03/28/24 0526 03/29/24 0407  WBC 9.9 9.4 8.4 8.3    Liver Function Tests: Recent Labs  Lab 03/23/24 1446 03/28/24 0526  AST 22 21  ALT 12 16  ALKPHOS 80 88  BILITOT 3.0* 2.6*  PROT 6.4* 6.3*  ALBUMIN  3.8 3.4*   No results for input(s): "LIPASE", "AMYLASE" in the last 168 hours. No results for input(s): "AMMONIA" in the last 168 hours.  ABG    Component Value Date/Time   HCO3 26.9 03/22/2024 1118   HCO3 26.2 03/22/2024 1118   TCO2 28 03/22/2024 1118   TCO2 27 03/22/2024 1118   ACIDBASEDEF 8.7 (H) 04/11/2021 0210   O2SAT 66.4 03/29/2024 0500     Coagulation Profile: No results for input(s): "INR", "PROTIME" in the last 168 hours.  Cardiac Enzymes: No results for input(s): "CKTOTAL", "CKMB", "CKMBINDEX", "TROPONINI" in the last 168 hours.  HbA1C: Hemoglobin A1C  Date/Time Value Ref Range Status  06/28/2016 12:00 AM 6.6  Final   HB A1C (BAYER DCA - WAIVED)  Date/Time Value Ref Range Status  12/19/2023 04:24 PM 7.4 (H) 4.8 - 5.6 % Final    Comment:             Prediabetes: 5.7 - 6.4          Diabetes: >6.4          Glycemic control for adults with diabetes: <7.0   09/02/2023 01:35 PM 6.3 (H) 4.8 - 5.6 % Final    Comment:             Prediabetes: 5.7 - 6.4          Diabetes: >6.4          Glycemic control for adults with diabetes: <7.0    Hgb A1c MFr Bld  Date/Time Value Ref Range Status  03/24/2024 04:27 AM 7.6 (H) 4.8  - 5.6 % Final    Comment:    (NOTE) Diagnosis of Diabetes The following HbA1c ranges recommended by the American Diabetes Association (ADA) may be used as an aid in the diagnosis of diabetes mellitus.  Hemoglobin             Suggested A1C NGSP%              Diagnosis  <5.7  Non Diabetic  5.7-6.4                Pre-Diabetic  >6.4                   Diabetic  <7.0                   Glycemic control for                       adults with diabetes.      CBG: Recent Labs  Lab 03/28/24 0727 03/28/24 1144 03/28/24 1654 03/28/24 2117 03/29/24 0719  GLUCAP 142* 71 184* 250* 169*    Review of Systems: Positives in BOLD   Gen: Denies fever, chills, weight change, but reports generalized fatigue HEENT: Denies blurred vision, double vision, hearing loss, tinnitus, sinus congestion, rhinorrhea, sore throat, neck stiffness, dysphagia PULM: Denies shortness of breath at rest but reports shortness of breath with minimal exertion.  Denies cough, sputum production, hemoptysis, wheezing CV: chest pain, mild edema that is significantly improved, orthopnea, paroxysmal nocturnal dyspnea, palpitations GI: Denies abdominal pain, nausea, vomiting, diarrhea, hematochezia, melena, but reports constipation and gaseous distention of her abdomen, also reports of farting GU: Denies dysuria, hematuria, polyuria, oliguria, urethral discharge Endocrine: Denies hot or cold intolerance, polyuria, polyphagia or appetite change Derm: Denies rash, dry skin, scaling or peeling skin change Heme: Denies easy bruising, bleeding, bleeding gums Neuro: Denies headache, numbness, weakness, slurred speech, loss of memory or consciousness  Past Medical History:  She,  has a past medical history of A-fib (HCC), CHF (congestive heart failure) (HCC), Chronic heart failure with preserved ejection fraction (HFpEF) (HCC), 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   Surgical History:   Past Surgical History:  Procedure Laterality Date   BASAL CELL CARCINOMA EXCISION  11/01/2022   CATARACT EXTRACTION W/PHACO Left 10/09/2021   Procedure: CATARACT EXTRACTION PHACO AND INTRAOCULAR LENS PLACEMENT (IOC) LEFT DIABETIC 4.84 00:37.8;  Surgeon: Clair Crews, MD;  Location: [facility];  Service: Ophthalmology;  Laterality: Left;  Diabetic   CATARACT EXTRACTION W/PHACO Right 10/30/2021   Procedure: CATARACT EXTRACTION PHACO AND INTRAOCULAR LENS PLACEMENT (IOC) RIGHT DIABETIC 5.65 00:38.5;  Surgeon: Clair Crews, MD;  Location: [facility];  Service: Ophthalmology;  Laterality: Right;  Diabetic   CHOLECYSTECTOMY     DILATION AND CURETTAGE OF UTERUS     IR FLUORO GUIDE CV LINE RIGHT  08/16/2021   IR THORACENTESIS ASP PLEURAL SPACE W/IMG GUIDE  08/16/2021   PERICARDIOCENTESIS N/A 08/05/2021   Procedure: PERICARDIOCENTESIS;  Surgeon: Percival Brace, MD;  Location: [facility] INVASIVE CV [facility];  Service: Cardiovascular;  Laterality: N/A;   RIGHT HEART CATH N/A 03/22/2024   Procedure: RIGHT HEART CATH;  Surgeon: Wenona Hamilton, MD;  Location: [facility] INVASIVE CV LAB;  Service: Cardiovascular;  Laterality: N/A;   TEAR DUCT PROBING WITH STRABISMUS REPAIR Right    TONSILLECTOMY       Social History:   reports that she has never smoked. She has never used smokeless tobacco. She reports that she does not currently use alcohol. She reports that she does not use drugs.   Family History:  Her family history includes Breast cancer in her maternal grandmother and mother;  Cancer in her brother and brother; Diabetes in her brother; Heart disease  in her brother and mother; Heart disease (age of onset: 32) in her father; Osteoporosis in her mother; Parkinson's disease in her brother.   Allergies No Known Allergies   Home Medications  Prior to Admission medications   Medication Sig Start Date End Date Taking? Authorizing Provider  allopurinol  (ZYLOPRIM ) 100 MG tablet Take 1 tablet (100 mg total) by mouth daily. 12/19/23  Yes Johnson, [name], DO  apixaban  (ELIQUIS ) 5 MG TABS tablet Take 1 tablet (5 mg total) by mouth 2 (two) times daily. 12/19/23  Yes Johnson, [name], DO  empagliflozin  (JARDIANCE ) 25 MG TABS tablet TAKE 1 TABLET BY MOUTH ONCE DAILY BEFOREBREAKFAST 12/19/23  Yes Johnson, [name], DO  folic acid  (FOLVITE ) 1 MG tablet Take 1 tablet (1 mg total) by mouth daily. 09/30/23  Yes Johnson, [name], DO  insulin  glargine, 2 Unit Dial, (TOUJEO  MAX SOLOSTAR) 300 UNIT/ML Solostar Pen Inject 16-20 Units into the skin daily. 12/19/23 09/14/24 Yes Johnson, [name], DO  latanoprost  (XALATAN ) 0.005 % ophthalmic solution Place 1 drop into both eyes at bedtime.   Yes [provider]  metolazone  (ZAROXOLYN ) 2.5 MG tablet Take 2.5 mg by mouth once a week. Wednesday   Yes [provider]  metoprolol  succinate (TOPROL -XL) 25 MG 24 hr tablet Take 0.5 tablets (12.5 mg total) by mouth daily. 12/19/23  Yes Johnson, [name], DO  midodrine  (PROAMATINE ) 10 MG tablet TAKE 1 TABLET BY MOUTH 3 TIMES DAILY WITH MEALS 02/24/23  Yes Gollan, Timothy J, MD  pantoprazole  (PROTONIX ) 40 MG tablet Take 1 tablet (40 mg total) by mouth 2 (two) times daily. 12/19/23  Yes Johnson, [name], DO  potassium chloride  SA (KLOR-CON  M) 20 MEQ tablet Take 2 tablets (40 mEq total) by mouth 2 (two) times daily. 12/19/23  Yes Johnson, [name], DO  rosuvastatin  (CRESTOR ) 5 MG tablet Take 1 tablet (5 mg total) by mouth once a week. 05/30/23  Yes Johnson, [name], DO  Semaglutide  (RYBELSUS ) 7 MG TABS Take 1 tablet (7 mg total) by mouth daily. 12/19/23  Yes Johnson, [name], DO   spironolactone  (ALDACTONE ) 50 MG tablet Take 1 tablet (50 mg total) by mouth daily. 12/19/23  Yes Johnson, [name], DO  torsemide  (DEMADEX ) 20 MG tablet TAKE 1 TO 2 TABLETS BY MOUTH DAILY. ONLY TAKE 2 TABLETS FOR 1 TO 3 DAYS AND IF PERSISTS, CALL WITH BAD SWELLING 12/19/23  Yes Johnson, [name], DO  Continuous Glucose Sensor (DEXCOM G7 SENSOR) MISC by Does not apply route.    [provider]  Glucagon  (GVOKE HYPOPEN  2-PACK) 0.5 MG/0.1ML SOAJ Inject 1 each into the skin daily as needed. Patient not taking: Reported on 03/19/2024 03/14/23   Terre Ferri P, DO  Insulin  Pen Needle (PEN NEEDLES) 32G X 4 MM MISC 1 each by Does not apply route 3 (three) times daily. Patient not taking: Reported on 02/24/2024 01/30/23   Terre Ferri P, DO  Insulin  Pen Needle (ULTRACARE PEN NEEDLES) 32G X 4 MM MISC USE 1 PEN ONCE DAILY Patient not taking: Reported on 02/24/2024 10/11/22   Terre Ferri P, DO  Insulin  Syringe-Needle U-100 (INSULIN  SYRINGE 1CC/31GX5/16") 31G X 5/16" 1 ML MISC 1 each by Does not apply route 3 (three) times daily. Patient not taking: Reported on 02/24/2024 12/13/22   Terre Ferri P, DO  Insulin  Syringe/Needle U-500 (BD INSULIN  SYRINGE U-500) 31G X 0.5 ML MISC 1 each by Does  not apply route 3 (three) times daily as needed. Patient not taking: Reported on 02/24/2024 03/14/23   Terre Ferri P, DO  metroNIDAZOLE (METROGEL) 1 % gel Apply to face every morning. Patient not taking: Reported on 03/19/2024 09/19/22   [provider]  NEEDLE, DISP, 30 G (BD DISP NEEDLES) 30G X 1/2" MISC by Does not apply route. Patient not taking: Reported on 02/24/2024    [provider]     Critical care time: 45 minutes    Francia Verry S. Tukov-Yual, ANP-BC Pulmonary and Critical Care Medicine Prefer epic messenger for cross cover needs or call ICU at [phone] If after hours, please call E-link.  NB: This document was prepared using Dragon voice recognition software and may include  unintentional dictation errors.

## 2024-03-29 NOTE — Progress Notes (Addendum)
 PHARMACY CONSULT NOTE  Pharmacy Consult for Electrolyte Monitoring and Replacement   Recent Labs: Potassium (mmol/L)  Date Value  03/29/2024 4.8   Magnesium  (mg/dL)  Date Value  16/07/9603 3.6 (H)   Calcium  (mg/dL)  Date Value  54/06/8118 9.1   Albumin  (g/dL)  Date Value  14/78/2956 3.4 (L)  12/19/2023 4.1   Phosphorus (mg/dL)  Date Value  21/30/8657 3.8   Sodium (mmol/L)  Date Value  03/29/2024 118 (LL)  12/19/2023 133 (L)   Assessment: 78 y.o. female with medical history significant of diastolic CHF, hypertension, hyperlipidemia, diabetes mellitus, PAD, gout, GI bleeding, CKD-3B, A-fib on Eliquis , chronic hyponatremia, chronic venous insufficiency, lymphedema, who presented with SOB and worsening leg edema. Pharmacy is asked to follow and replace electrolytes while in CCU  Relevant medications: Lasix  80 mg IV BID>>stopped 6/8, milrinone , NE, midodrine   Goal of Therapy:  Electrolytes WNL  Plan:  --Na 118, down-trending. 3% hypertonic saline bolus ordered by Advanced Heart Failure team --Q6h checks ordered(to be managed by cardiology/nephrology)  --Re-check electrolytes with AM labs tomorrow AM  Keylani Perlstein A Avraj Lindroth, PharmD Clinical Pharmacist 03/29/2024 11:27 AM

## 2024-03-29 NOTE — Progress Notes (Signed)
 Na is 118 this morning. Nellie Banas, NP is aware and no new orders received at this time. Patient continues to be alert to self, place, time, and situation. No confusion noted.

## 2024-03-29 NOTE — Interval H&P Note (Signed)
 History and Physical Interval Note:  03/29/2024 12:38 PM  Karen Farley  has presented today for surgery, with the diagnosis of Constriction.  The various methods of treatment have been discussed with the patient and family. After consideration of risks, benefits and other options for treatment, the patient has consented to  Procedure(s): RIGHT AND LEFT HEART CATH (N/A) as a surgical intervention.  The patient's history has been reviewed, patient examined, no change in status, stable for surgery.  I have reviewed the patient's chart and labs.  Questions were answered to the patient's satisfaction.     Lauralee Poll

## 2024-03-29 NOTE — Progress Notes (Addendum)
 PT Cancellation Note  Patient Details Name: Karen Farley MRN: [id number] DOB: [birth year]   Cancelled Treatment:    Reason Eval/Treat Not Completed: Medical issues which prohibited therapy. Per chart review pt with Na of 118 this morning at 0407. PT contraindicated with Na <120. Will follow up with PT treatment as appropriate.  ADDENDUM 1156: Na redraw continues to be 118. Will hold PT intervention today, and follow up tomorrow as appropriate.    Branda Cain, PT, DPT 7:58 AM,03/29/24 Physical Therapist - [facility]

## 2024-03-30 ENCOUNTER — Encounter: Payer: Self-pay | Admitting: Cardiology

## 2024-03-30 DIAGNOSIS — Z515 Encounter for palliative care: Secondary | ICD-10-CM

## 2024-03-30 DIAGNOSIS — R0602 Shortness of breath: Secondary | ICD-10-CM | POA: Diagnosis not present

## 2024-03-30 DIAGNOSIS — I4821 Permanent atrial fibrillation: Secondary | ICD-10-CM | POA: Diagnosis not present

## 2024-03-30 DIAGNOSIS — Z7189 Other specified counseling: Secondary | ICD-10-CM

## 2024-03-30 DIAGNOSIS — I272 Pulmonary hypertension, unspecified: Secondary | ICD-10-CM | POA: Diagnosis not present

## 2024-03-30 DIAGNOSIS — I9589 Other hypotension: Secondary | ICD-10-CM

## 2024-03-30 DIAGNOSIS — I3139 Other pericardial effusion (noninflammatory): Secondary | ICD-10-CM | POA: Diagnosis not present

## 2024-03-30 DIAGNOSIS — I5033 Acute on chronic diastolic (congestive) heart failure: Secondary | ICD-10-CM | POA: Diagnosis not present

## 2024-03-30 LAB — BASIC METABOLIC PANEL WITH GFR
Anion gap: 15 (ref 5–15)
Anion gap: 14 (ref 5–15)
BUN: 97 mg/dL — ABNORMAL HIGH (ref 8–23)
BUN: 89 mg/dL — ABNORMAL HIGH (ref 8–23)
CO2: 24 mmol/L (ref 22–32)
CO2: 25 mmol/L (ref 22–32)
Calcium: 8.8 mg/dL — ABNORMAL LOW (ref 8.9–10.3)
Calcium: 8.2 mg/dL — ABNORMAL LOW (ref 8.9–10.3)
Chloride: 80 mmol/L — ABNORMAL LOW (ref 98–111)
Chloride: 85 mmol/L — ABNORMAL LOW (ref 98–111)
Creatinine, Ser: 3.07 mg/dL — ABNORMAL HIGH (ref 0.44–1.00)
Creatinine, Ser: 2.88 mg/dL — ABNORMAL HIGH (ref 0.44–1.00)
GFR, Estimated: 15 mL/min — ABNORMAL LOW (ref >60)
GFR, Estimated: 16 mL/min — ABNORMAL LOW (ref >60)
Glucose, Bld: 204 mg/dL — ABNORMAL HIGH (ref 70–99)
Glucose, Bld: 143 mg/dL — ABNORMAL HIGH (ref 70–99)
Potassium: 4.6 mmol/L (ref 3.5–5.1)
Potassium: 3.9 mmol/L (ref 3.5–5.1)
Sodium: 119 mmol/L — CL (ref 135–145)
Sodium: 124 mmol/L — ABNORMAL LOW (ref 135–145)

## 2024-03-30 LAB — APTT
aPTT: 28 s (ref 24–36)
aPTT: >200 s (ref 24–36)
aPTT: 109 s — ABNORMAL HIGH (ref 24–36)

## 2024-03-30 LAB — MAGNESIUM: Magnesium: 3.4 mg/dL — ABNORMAL HIGH (ref 1.7–2.4)

## 2024-03-30 LAB — COOXEMETRY PANEL
Carboxyhemoglobin: 1.8 % — ABNORMAL HIGH (ref 0.5–1.5)
Carboxyhemoglobin: 1.7 % — ABNORMAL HIGH (ref 0.5–1.5)
Carboxyhemoglobin: 1.5 % (ref 0.5–1.5)
Methemoglobin: 1 % (ref 0.0–1.5)
Methemoglobin: <0.7 % (ref 0.0–1.5)
Methemoglobin: 0.8 % (ref 0.0–1.5)
O2 Saturation: 81.8 %
O2 Saturation: 68.8 %
O2 Saturation: 60 %
Total hemoglobin: 12.4 g/dL (ref 12.0–16.0)
Total hemoglobin: 10.2 g/dL — ABNORMAL LOW (ref 12.0–16.0)
Total hemoglobin: 11.3 g/dL — ABNORMAL LOW (ref 12.0–16.0)
Total oxygen content: 79.5 %
Total oxygen content: 67.4 %
Total oxygen content: 58.7 %

## 2024-03-30 LAB — SODIUM
Sodium: 119 mmol/L — CL (ref 135–145)
Sodium: 123 mmol/L — ABNORMAL LOW (ref 135–145)

## 2024-03-30 LAB — CBC
HCT: 37 % (ref 36.0–46.0)
Hemoglobin: 11.6 g/dL — ABNORMAL LOW (ref 12.0–15.0)
MCH: 22.2 pg — ABNORMAL LOW (ref 26.0–34.0)
MCHC: 31.4 g/dL (ref 30.0–36.0)
MCV: 70.7 fL — ABNORMAL LOW (ref 80.0–100.0)
Platelets: 362 10*3/uL (ref 150–400)
RBC: 5.23 MIL/uL — ABNORMAL HIGH (ref 3.87–5.11)
RDW: 21.6 % — ABNORMAL HIGH (ref 11.5–15.5)
WBC: 7.7 10*3/uL (ref 4.0–10.5)
nRBC: 0.3 % — ABNORMAL HIGH (ref 0.0–0.2)

## 2024-03-30 LAB — GLUCOSE, CAPILLARY
Glucose-Capillary: 155 mg/dL — ABNORMAL HIGH (ref 70–99)
Glucose-Capillary: 169 mg/dL — ABNORMAL HIGH (ref 70–99)
Glucose-Capillary: 153 mg/dL — ABNORMAL HIGH (ref 70–99)
Glucose-Capillary: 120 mg/dL — ABNORMAL HIGH (ref 70–99)

## 2024-03-30 LAB — PROTIME-INR
INR: 1.5 — ABNORMAL HIGH (ref 0.8–1.2)
Prothrombin Time: 18 s — ABNORMAL HIGH (ref 11.4–15.2)

## 2024-03-30 LAB — HEPARIN LEVEL (UNFRACTIONATED): Heparin Unfractionated: >1.1 [IU]/mL — ABNORMAL HIGH (ref 0.30–0.70)

## 2024-03-30 MED ORDER — APIXABAN 5 MG PO TABS
5.0000 mg | ORAL_TABLET | Freq: Two times a day (BID) | ORAL | Status: DC
  Filled 2024-03-30: qty 1

## 2024-03-30 MED ORDER — FUROSEMIDE 10 MG/ML IJ SOLN
120.0000 mg | Freq: Once | INTRAVENOUS | Status: AC
  Administered 2024-03-30: 120 mg via INTRAVENOUS
  Filled 2024-03-30: qty 12

## 2024-03-30 MED ORDER — FUROSEMIDE 10 MG/ML IJ SOLN
240.0000 mg | Freq: Once | INTRAVENOUS | Status: AC
  Administered 2024-03-30: 240 mg via INTRAVENOUS
  Filled 2024-03-30: qty 24

## 2024-03-30 MED ORDER — METOLAZONE 5 MG PO TABS
10.0000 mg | ORAL_TABLET | Freq: Once | ORAL | Status: AC
  Administered 2024-03-30: 10 mg via ORAL
  Filled 2024-03-30: qty 2

## 2024-03-30 MED ORDER — HEPARIN BOLUS VIA INFUSION
3500.0000 [IU] | Freq: Once | INTRAVENOUS | Status: AC
  Administered 2024-03-30: 3500 [IU] via INTRAVENOUS
  Filled 2024-03-30: qty 3500

## 2024-03-30 MED ORDER — VASOPRESSIN 20 UNITS/100 ML INFUSION FOR SHOCK
0.0000 [IU]/min | INTRAVENOUS | Status: DC
  Administered 2024-03-30 – 2024-03-31 (×3): 0.03 [IU]/min via INTRAVENOUS
  Filled 2024-03-30 (×3): qty 100

## 2024-03-30 MED ORDER — SODIUM CHLORIDE 3 % IV BOLUS
150.0000 mL | Freq: Once | INTRAVENOUS | Status: AC
  Administered 2024-03-30: 150 mL via INTRAVENOUS
  Filled 2024-03-30: qty 500

## 2024-03-30 MED ORDER — FUROSEMIDE 10 MG/ML IJ SOLN
120.0000 mg | Freq: Once | INTRAVENOUS | Status: AC
  Administered 2024-03-30: 120 mg via INTRAVENOUS
  Filled 2024-03-30: qty 2

## 2024-03-30 MED ORDER — HEPARIN (PORCINE) 25000 UT/250ML-% IV SOLN
800.0000 [IU]/h | INTRAVENOUS | Status: DC
  Administered 2024-03-30: 1000 [IU]/h via INTRAVENOUS
  Filled 2024-03-30: qty 250

## 2024-03-30 MED ORDER — MILRINONE LACTATE IN DEXTROSE 20-5 MG/100ML-% IV SOLN
0.1250 ug/kg/min | INTRAVENOUS | Status: DC
  Administered 2024-03-30: 0.125 ug/kg/min via INTRAVENOUS
  Filled 2024-03-30: qty 100

## 2024-03-30 NOTE — Consult Note (Signed)
 Palliative Care Consult Note                                  Date: 03/30/2024   Patient Name: Karen Farley  DOB: 1946/10/14  MRN: [id number]  Age / Sex: 78 y.o., female  PCP: Vicci Duwaine SQUIBB, DO Referring Physician: Malka Domino, MD  Reason for Consultation: Establishing goals of care  Past Medical History:  Diagnosis Date   A-fib [facility])    CHF (congestive heart failure) (HCC)    Chronic heart failure with preserved ejection fraction (HFpEF) (HCC)    a. 11/2019 Echo: EF 60-65%; b. 07/2021 Echo: EF 60-65%.   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 07/2021 req 2u prbcs. Eval deferred given resolution of bleeding and cardiac issues.   Hyperlipidemia    Hypertension    Lymphedema    Morbid obesity (HCC)    Osteoarthritis of both knees    Osteopenia    Pericardial effusion    a. 07/2021 Echo: Nl EF. Large pericardial effusion-->s/p pericardiocentesis of ; b. 08/23/2021 Echo: EF 60-65%, mild LVH, Nl RV fxn, sev dil LA, moderate pericardial effusion. L pleural effusion.   Permanent atrial fibrillation (HCC)    Pleural effusion, left    a. 07/2021 s/p thoracentesis - .    Subjective:   This NP Camellia Kays reviewed medical records, received report from team, assessed the patient and then meet at the patient's bedside to discuss diagnosis, prognosis, GOC, EOL wishes disposition and options.  Before meeting with the patient/family, I spent time reviewing the chart including cardiology H&P, nephrology notes, PCCM notes, cardiology notes reviewed from advanced heart failure team.  Reviewed labs including oximetry.  All sat of 81.8, CBC without leukocytosis, BMP with critical sodium of 119, elevated BUN/creatinine  consistent with history of CKD as well as AKI likely secondary to acute on chronic diastolic heart failure.   I met with the patient at the bedside, her husband and brother were both present.   We meet to discuss diagnosis prognosis, GOC, EOL wishes, disposition and options. Concept of Palliative Care was introduced as specialized medical care for people and their families living with serious illness.  If focuses on providing relief from the symptoms and stress of a serious illness.  The goal is to improve quality of life for both the patient and the family. Values and goals of care important to patient and family were attempted to be elicited.  Created space and opportunity for patient  and family to explore thoughts and feelings regarding current medical situation   Natural trajectory and current clinical status were discussed. Questions and concerns addressed. Patient  encouraged to call with questions or concerns.    Patient/Family Understanding of Illness: They understand that they have been told there is nothing good.  They made mention about the possibility of open heart surgery but high risk.  They also know they are considered a trial of CRRT/dialysis to see if this can improve her conditioning.  We spent an extensive amount of time talking about the complex medical situation she is in including interplay between constriction with subsequent heart failure, cardiorenal/renal disease, hepatic disease likely from heart failure as  well.  Of note the patient did a couple times mention I hope I do not need a heart transplant.  I am not sure if she fully understands her risk for surgery and the likelihood that she is not a surgical candidate for open heart surgery, nevermind her transplant.  This discussion will likely need to be continued and reinforced in the future.  Life Review: The patient and her husband have been married for 56 years, will be 57 on [birth year].  They have reminisced about her  daughter who was born with transposition of the great vessels and underwent multiple surgeries but passed at the age of 97 in 59.  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  The patient notes that she stayed at home until her daughter was 68 years old and had to go to school because of the risk of infection with going to daycare.  She states her life was mostly centered around her daughter, but they did enjoy traveling when her daughter was a bit older.  She spoke of a family trip to [place] but seems to bring joy and happy memories.  They also undertook road trips across the country.  Patient Values: Family  Goals: To try to get better if possible  Today's Discussion: In addition to discussion described above we had extensive discussion on various topics.  We spent a lot of time talking about the patient's daughter and her multiple surgeries she had in their understanding of the gravity of cardiac surgery.  We talked about how for her how poor her health is now with acute on chronic heart failure and the ramifications including AKI, cirrhosis and liver disease, and in essence that she is in multisystem organ failure.  She understands that she may not be a surgical candidate and options may be limited.  She does talk about possibility of a heart transplant but we again talked about how heart surgery would be very risky and she may not be a candidate.  Her husband seems to have a better understanding of this and is understandably quite tearful during our conversation.  We discussed that she was raised Catholic but has not gone to church in some time.  I offered spiritual care/chaplain support if needed and told them they could simply notify the nurse if it is desired at any point.  I also attempted to have a CODE STATUS discussion.  The patient seemed unable to focus on the idea of CPR resuscitation and  when talking about resuscitation as when she brought up heart transplant, which again we discussed would be too high risk.  I encouraged him to think about resuscitation and the risks versus benefits for further discussion in the future.  At this time we will remain a full code.  At the end of our conversation we agreed to take it a day at a time, and make decisions as needed.  They are hopeful in the coming days that she may become a better candidate for surgery.  They understand that there is a chance that there may be some improvement, but there is also a big chance that she may not improve and discussions will get more difficult.  I provided a palliative medicine contact information for any questions or concerns.  I shared that I would return tomorrow to check in.  I encouraged him to call for any needs. I provided emotional and general support through  therapeutic listening, empathy, sharing of stories, and other techniques. I answered all questions and addressed all concerns to the best of my ability.  Review of Systems  Constitutional:  Positive for appetite change and fatigue.  Cardiovascular:  Negative for chest pain.  Gastrointestinal:  Negative for abdominal pain, nausea and vomiting.  Neurological:  Positive for weakness.    Objective:   Primary Diagnoses: Present on Admission:  Acute on chronic diastolic heart failure (HCC)  Type 2 diabetes mellitus with renal complication (HCC)  Hyperlipidemia  Hyponatremia  HTN (hypertension)  Acute renal failure superimposed on stage 3b chronic kidney disease (HCC)  Overweight (BMI 25.0-29.9)  Myocardial injury  Atrial fibrillation, chronic (HCC)  Acute on chronic diastolic CHF (congestive heart failure) (HCC)   Vital Signs:  BP 94/68 (BP Location: Left Leg)   Pulse 90   Temp 97.8 F (36.6 C) (Axillary)   Resp 17   Ht 5' (1.524 m)   Wt 68.8 kg   SpO2 94%   BMI 29.62 kg/m   Physical Exam Vitals and nursing note reviewed.   Constitutional:      General: She is not in acute distress.    Appearance: She is ill-appearing.  HENT:     Head: Normocephalic and atraumatic.  Cardiovascular:     Rate and Rhythm: Normal rate. Rhythm irregular.  Pulmonary:     Effort: Pulmonary effort is normal. No respiratory distress.     Breath sounds: No wheezing or rhonchi.  Abdominal:     General: Abdomen is flat. There is no distension.     Palpations: Abdomen is soft.  Skin:    General: Skin is warm and dry.  Neurological:     General: No focal deficit present.     Mental Status: She is alert and oriented to person, place, and time.  Psychiatric:        Mood and Affect: Mood normal.        Behavior: Behavior normal.     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 Individuals present for the conversation:  Summary of the conversation: We discussed the severity of her clinical illness and potential of limited treatment options.  We discussed that likely poor candidate for surgery, coronary trials CRRT for surgical candidacy improvement.  We discussed CODE STATUS, desire for treatments at this time.  Outcome of the conversations and/or documents completed: Remain full code for now, continued attempts to improve/aggressive interventions, ongoing GOC conversations.  I spent 30 minutes providing separately identifiable ACP services with the patient and/or surrogate decision maker in a voluntary, in-person conversation discussing the patient's wishes and goals as detailed in the above note.  Assessment & Plan:   HPI/Patient  Profile: 78 y.o. female  with past medical history of permanent A-fib, HFpEF, CKD stage III, type 1 diabetes, HLD, HTN, pericardial effusion who presented with dyspnea on exertion, abdominal distention, leg edema for several weeks.  She was admitted on 03/19/2024 with acute on chronic diastolic heart failure, pulmonary hypertension with evidence of RV compromise, pericardial effusion, cardiorenal syndrome with AKI on CKD, hyponatremia, respiratory failure secondary to CHF exacerbation, and others.   Palliative medicine was consulted for GOC conversations.  SUMMARY OF RECOMMENDATIONS  Full code Full scope of care Hopeful for improvement, possibly with trial of CRRT Continue GOC conversations related to options as they are identified Take it a day at a time Palliative medicine will continue to follow  Symptom Management:  Per primary team PMD is available to assist as needed  Code Status: Full code  Prognosis:  Unable to determine  Discharge Planning:  To Be Determined   Discussed with: Patient, patient's family, medical team, bedside nursing    Thank you for allowing us  to participate in the care of Karen Farley PMT will continue to support holistically.  Billing based on MDM: High  Problems Addressed: One acute or chronic illness or injury that poses a threat to life or bodily function  Amount and/or Complexity of Data: Category 1:Review of prior external note(s) from each unique source, Review of the result(s) of each unique test, and Assessment requiring an independent historian(s) and Category 3:Discussion of management or test interpretation with external physician/other qualified health care professional/appropriate source (not separately reported)  Risks: N/A   Signed by: Camellia Kays, NP Palliative Medicine Team  Team Phone # [phone] (Nights/Weekends)  03/30/2024, 11:21 AM

## 2024-03-30 NOTE — Plan of Care (Signed)
  Problem: Coping: Goal: Ability to adjust to condition or change in health will improve Outcome: Progressing   Problem: Skin Integrity: Goal: Risk for impaired skin integrity will decrease Outcome: Progressing   Problem: Education: Goal: Knowledge of General Education information will improve Description: Including pain rating scale, medication(s)/side effects and non-pharmacologic comfort measures Outcome: Progressing   Problem: Clinical Measurements: Goal: Will remain free from infection Outcome: Progressing Goal: Diagnostic test results will improve Outcome: Progressing Goal: Cardiovascular complication will be avoided Outcome: Progressing

## 2024-03-30 NOTE — TOC Progression Note (Signed)
 Transition of Care [facility]) - Progression Note    Patient Details  Name: Karen Farley MRN: [id number] Date of Birth: December 05, 1945  Transition of Care [facility]) CM/SW Contact  Javani Spratt A Osama Coleson, RN Phone Number: 03/30/2024, 4:10 PM  Clinical Narrative:    Chart reviewed.  Noted that patient was admitted with Acute on chronic diastolic heart failure.  Heart Failure team and Nephrology are following.  Patient has heart mechanical issue and is not a surgical candidate. CRRT  may be considered if needed. Palliative has been consulted to discuss goals of care.  Patient remains a Full Code at this time.  TOC will continue to follow for discharge planning.    Expected Discharge Plan: Home w Home Health Services Barriers to Discharge: Continued Medical Work up  Expected Discharge Plan and Services   Discharge Planning Services: CM Consult Post Acute Care Choice: Home Health Living arrangements for the past 2 months: Single Family Home                 DME Arranged:  (walker with wheels, at home.  Patient reports that she has a seat the pulls out in the shower.)         HH Arranged: RN, PT, OT HH Agency: [facility] Date [facility] Agency Contacted: 03/25/24   Representative spoke with at [facility] Agency: [place]    Social Determinants of Health (SDOH) Interventions SDOH Screenings   Food Insecurity: No Food Insecurity (03/19/2024)  Housing: Low Risk  (03/19/2024)  Transportation Needs: No Transportation Needs (03/19/2024)  Utilities: Not At Risk (03/19/2024)  Alcohol Screen: Low Risk  (07/23/2022)  Depression (PHQ2-9): Low Risk  (12/19/2023)  Financial Resource Strain: Low Risk  (07/23/2022)  Physical Activity: Sufficiently Active (07/23/2022)  Social Connections: Moderately Integrated (03/30/2024)  Stress: No Stress Concern Present (07/23/2022)  Tobacco Use: Low Risk  (03/19/2024)    Readmission Risk Interventions     No data to display

## 2024-03-30 NOTE — Consult Note (Signed)
 PHARMACY - ANTICOAGULATION CONSULT NOTE  Pharmacy Consult for Heparin  Indication: atrial fibrillation  No Known Allergies  Patient Measurements: Height: 5' (152.4 cm) Weight: 68.8 kg (151 lb 10.8 oz) IBW/kg (Calculated) : 45.5 HEPARIN  DW (KG): 60.5  Vital Signs: Temp: 97.8 F (36.6 C) (06/10 1200) Temp Source: Oral (06/10 1200) BP: 99/64 (06/10 1400) Pulse Rate: 98 (06/10 1400)  Labs: Recent Labs    03/28/24 0526 03/29/24 0407 03/29/24 1257 03/29/24 1302 03/29/24 1303 03/29/24 1754 03/30/24 0452 03/30/24 1130  HGB 11.9* 11.6*   < > 13.9 14.3  --  11.6*  --   HCT 38.5 36.8   < > 41.0 42.0  --  37.0  --   PLT 341 358  --   --   --   --  362  --   APTT  --   --   --   --   --   --   --  28  LABPROT  --   --   --   --   --   --   --  18.0*  INR  --   --   --   --   --   --   --  1.5*  HEPARINUNFRC  --   --   --   --   --   --   --  >1.10*  CREATININE 2.34* 3.07*  --   --   --  3.14* 3.07*  --    < > = values in this interval not displayed.    Estimated Creatinine Clearance: 13.1 mL/min (A) (by C-G formula based on SCr of 3.07 mg/dL (H)).   Medical History: Past Medical History:  Diagnosis Date   A-fib [facility])    CHF (congestive heart failure) (HCC)    Chronic heart failure with preserved ejection fraction (HFpEF) (HCC)    a. 11/2019 Echo: EF 60-65%; b. 07/2021 Echo: EF 60-65%.   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 07/2021 req 2u prbcs. Eval deferred given resolution of bleeding and cardiac issues.   Hyperlipidemia    Hypertension    Lymphedema    Morbid obesity (HCC)    Osteoarthritis of both knees    Osteopenia    Pericardial effusion    a. 07/2021 Echo: Nl EF. Large pericardial effusion-->s/p pericardiocentesis of ; b. 08/23/2021 Echo: EF 60-65%,  mild LVH, Nl RV fxn, sev dil LA, moderate pericardial effusion. L pleural effusion.   Permanent atrial fibrillation (HCC)    Pleural effusion, left    a. 07/2021 s/p thoracentesis - .    Medications:  Apixaban  5mg  BID.  Last dose: 6/8@0935   Assessment: 78 year old female patient with a past medical history of persistent A-fib, heart failure with preserved EF, CKD stage III, hyperlipidemia, hypertension, type 1 diabetes mellitus who presented to [facility] on 05/30 with acute on chronic hypoxic respiratory failure due to CHF exacerbation. Patient with new diagnosis of CKD with possible need for dialysis port placement. Pharmacy has been consulted to initiate and monitor continuous heparin  infusion.   Goal of Therapy:  Heparin  level 0.3-0.7 units/ml aPTT 66-102 seconds Monitor platelets by anticoagulation protocol: Yes   Plan:  Give 3500 units bolus x 1 Start heparin  infusion at 1000 units/hr Check aPTT level  in 8 hours and HL daily Will continue to dose via aPTT until both levels correlate, then transition to HL dosing only Continue to monitor H&H and platelets  Karen Farley A Karen Farley 03/30/2024,3:40 PM

## 2024-03-30 NOTE — Progress Notes (Signed)
 OT Cancellation Note  Patient Details Name: Karen Farley MRN: [id number] DOB: 1946-09-09   Cancelled Treatment:    Reason Eval/Treat Not Completed: Medical issues which prohibited therapy.  Per chart review, Na of 119 this morning at 0452. OT contraindicated with Na <120. Will re-attempt as medically appropriate.   Candence Sease L. Ardie Mclennan, OTR/L  03/30/24, 11:36 AM

## 2024-03-30 NOTE — Progress Notes (Deleted)
    Brief Palliative Medicine Progress Note:   PMT consult received and chart reviewed. Goals of care completed, full note to follow.   Recommendations: Continue full code, full scope of care Time for outcomes Ongoing goals of care conversations as clinical picture evolves and options become clear Ongoing supportive patient family Palliative medicine will continue to follow     Thank you for allowing us  to participate in the care of Karen Farley   Signed by: Lizbeth Right, NP Palliative Medicine Team [facility] CHARGE   Team Phone # [phone] (Nights/Weekends)  03/30/2024, 3:44 PM

## 2024-03-30 NOTE — Progress Notes (Signed)
 PT Cancellation Note  Patient Details Name: BRETTA FEES MRN: [id number] DOB: 26-Dec-1945   Cancelled Treatment:    Reason Eval/Treat Not Completed: Medical issues which prohibited therapy. Per chart review, Na of 119 this morning at 0452. PT contraindicated with Na <120. Will re attempt as medically appropriate.   Priyansh Pry M Fairly, PT, DPT 03/30/24 9:45 AM

## 2024-03-30 NOTE — Progress Notes (Signed)
 Advanced Heart Failure Rounding Note   Subjective:     Remains on pressor support with NE as well as milrinone  0.25mcg/kg/min and midodrine  10mg  TID.  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 She is not a surgical candidate currently given her significant debility and renal dysfunction. She has developed tremendous diuretic resistance and her worsening sodium and chloride speak to her worsening renal function. Given a small bolus of lasix  yesterday and 2 boluses of hypertonic saline with minimal improvement in lab values.  As discussed, if nephrology is willing to offer it, a trial of CRRT is reasonable for volume management and in the hope that her functional status imprioves with solute clearance. But she is not a long term iHD candidate, and worry that her renal function will continue to worsen, and is very likely that she will still not be a surgical candidate following this. I shared my thoughts. Family interested in transfer to [facility] for surgery if need be but I do not expect that they would offer this procedure either.       Objective:   Weight Range:  Vital Signs:   Temp:  [97.5 F (36.4 C)-97.9 F (36.6 C)] 97.8 F (36.6 C) (06/10 0400) Pulse Rate:  [53-133] 99 (06/10 0515) Resp:  [11-29] 15 (06/10 0515) BP: (84-121)/(42-81) 103/68 (06/10 0515) SpO2:  [74 %-99 %] 92 % (06/10 0515) Weight:  [68.8 kg] 68.8 kg (06/09 0945) Last BM Date : 03/28/24  Weight change: Filed Weights   03/27/24 0500 03/29/24 0500 03/29/24 0945  Weight: 64.5 kg 68.8 kg 68.8 kg    Intake/Output:   Intake/Output Summary (Last 24 hours) at 03/30/2024 0631 Last data filed at 03/30/2024 0500 Gross per 24 hour  Intake 394.34 ml  Output 515 ml  Net -120.66 ml     Physical Exam: General:  Elderly chronically ill appearing, frail Cor: Irregular, tachycardic, JVP to the jaw, clear V waves, 2/6 AS Lungs: clear Abdomen: mild distension Extremities:  no cyanosis, clubbing, rash, tr edema Neuro: alert & orientedx3, cranial nerves grossly intact. moves all 4 extremities w/o difficulty. Affect pleasant  Telemetry: AF 110s-120s  Labs: Basic Metabolic Panel: Recent Labs  Lab 03/24/24 0427 03/25/24 0532 03/25/24 2232 03/26/24 6295 03/27/24 0402 03/28/24 2841 03/29/24 0407 03/29/24 0902 03/29/24 1257 03/29/24 1302 03/29/24 1303 03/29/24 1441 03/29/24 1754 03/30/24 0303 03/30/24 0452  NA 127* 127*   < > 128* 125* 123* 118*   < > 119* 121* 119* 121* 118* 119* 119*  K 3.4* 3.5   < > 4.0 3.1* 4.4 4.8  --  4.5 4.4 4.5  --  4.9  --  4.6  CL 85* 88*   < > 87* 82* 82* 78*  --   --   --   --   --  79*  --  80*  CO2 27 27   < > 30 26 24 26   --   --   --   --   --  24  --  24  GLUCOSE 168* 239*   < > 168* 153* 133* 149*  --   --   --   --   --  201*  --  204*  BUN 70* 71*   < > 72* 73* 79* 95*  --   --   --   --   --  93*  --  97*  CREATININE 2.15* 1.85*   < > 1.89* 1.90* 2.34* 3.07*  --   --   --   --   --  3.14*  --  3.07*  CALCIUM  9.2 8.9   < > 9.4 9.1 9.4 9.1  --   --   --   --   --  8.7*  --  8.8*  MG 2.4 2.3  --  2.6* 2.7*  --  3.6*  --   --   --   --   --   --   --  3.4*  PHOS 3.3 3.0  --   --   --   --  3.8  --   --   --   --   --   --   --   --    < > = values in this interval not displayed.    Liver Function Tests: Recent Labs  Lab 03/23/24 1446 03/28/24 0526  AST 22 21  ALT 12 16  ALKPHOS 80 88  BILITOT 3.0* 2.6*  PROT 6.4* 6.3*  ALBUMIN  3.8 3.4*   No results for input(s): "LIPASE", "AMYLASE" in the last 168 hours. No results for input(s): "AMMONIA" in the last 168 hours.  CBC: Recent Labs  Lab 03/26/24 0509 03/27/24 0402 03/28/24 0526 03/29/24 0407 03/29/24 1257 03/29/24 1302 03/29/24 1303 03/30/24 0452  WBC 9.9 9.4 8.4 8.3  --   --   --  7.7  HGB 11.4* 11.5* 11.9* 11.6* 14.3 13.9 14.3 11.6*  HCT 38.0 37.3 38.5 36.8 42.0 41.0 42.0 37.0  MCV 70.2* 69.2* 70.4* 68.9*  --   --   --  70.7*  PLT 313 309 341  358  --   --   --  362    Cardiac Enzymes: No results for input(s): "CKTOTAL", "CKMB", "CKMBINDEX", "TROPONINI" in the last 168 hours.  BNP: BNP (last 3 results) Recent Labs    12/19/23 1624 03/19/24 1038  BNP 132.4* 265.4*    ProBNP (last 3 results) No results for input(s): "PROBNP" in the last 8760 hours.    Medications:     Scheduled Medications:  allopurinol   100 mg Oral Daily   Chlorhexidine  Gluconate Cloth  6 each Topical Daily   docusate sodium   100 mg Oral BID   feeding supplement  237 mL Oral BID BM   folic acid   1 mg Oral Daily   insulin  aspart  0-15 Units Subcutaneous TID WC   insulin  aspart  0-5 Units Subcutaneous QHS   insulin  aspart  3 Units Subcutaneous TID WC   insulin  glargine-yfgn  10 Units Subcutaneous BID   latanoprost   1 drop Both Eyes QHS   midodrine   10 mg Oral Q8H   pantoprazole   40 mg Oral BID   polyethylene glycol  17 g Oral Daily   rosuvastatin   5 mg Oral Weekly   senna  1 tablet Oral Daily   sodium chloride  flush  10-40 mL Intracatheter Q12H    Infusions:  milrinone  0.25 mcg/kg/min (03/30/24 0124)   norepinephrine  (LEVOPHED ) Adult infusion 10 mcg/min (03/30/24 0124)    PRN Medications: acetaminophen , albuterol , bisacodyl , dextromethorphan-guaiFENesin , ondansetron  (ZOFRAN ) IV, mouth rinse, oxyCODONE , sodium chloride  flush, traMADol , traZODone    Assessment/Plan:   1. 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 Will stop milrinone , augment with  vasopressin, and increase IV diuresis while consider CRRT. Would not offer iHD long term, and I do not expect CVVHD to improve her  significantly - Increase IV lasix  to 240mg  BID, metolazone  10mg  x1 and hypertonic saline BID in an attempt to overcome diuretic resistance - frank conversation had as above. Unfortunately she has a mechanical issue that will not be improved by further medication optimization and is not a surgical candidate. Would recommend palliative involvement   2. 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 Up to 2.8 on admission - Worsening over the weekend, now with severe hyponatremia, hypochloremia, poor response to diuretics.  - nephrology following, appreciate recs - Considering CRRT, unlikely to be beneficial long term  3. Moderate to severe AS/moderate MR - stable   4. Small pericardial Effusion - h/o pericardial effusion s/p pericardiocentesis 700 ml in 07/2021. Bloody, neg for malignancy. - small w/o tamponade on echo - MRI ok   5. Atrial Fibrillation: Permanent - hold ? blocker - Off OAC currently - rate a bit faster today off b-blocker   7. Hyponatremia - Worsening despite starting HTS. Poor prognosis - restrict FW   H/o cirrhosis - s/p paracentesis in 2022 - CT chest with suspected liver cirrhosis - suspect this was/is in the setting of cardiohepatic syndrome due to constrictive pericarditis - BP support as above   Iron  deficiency anemia - given IV iron   ebility - Continue PT/OT - OOB to chair  - Severely limits any consideration of surgery  CRITICAL CARE Performed by: Lauralee Poll   Total critical care time: 65 minutes  Critical care time was exclusive of separately billable procedures and treating other patients.  Critical care was necessary to treat or prevent imminent or life-threatening deterioration.  Critical care was time spent personally by me on the following activities: development of treatment plan with patient and/or surrogate as well as nursing, discussions with consultants, evaluation of patient's response to  treatment, examination of patient, obtaining history from patient or surrogate, ordering and performing treatments and interventions, ordering and review of laboratory studies, ordering and review of radiographic studies, pulse oximetry and re-evaluation of patient's condition.    Length of Stay: 11   Lauralee Poll MD 03/30/2024, 6:31 AM  Advanced Heart Failure Team Pager [phone] (M-F; 7a - 4p)  Please contact [facility] for night-coverage after hours (4p -7a ) and weekends on [email or web address]

## 2024-03-30 NOTE — Consult Note (Signed)
 PHARMACY - ANTICOAGULATION CONSULT NOTE  Pharmacy Consult for Heparin  Indication: atrial fibrillation  No Known Allergies  Patient Measurements: Height: 5' (152.4 cm) Weight: 68.8 kg (151 lb 10.8 oz) IBW/kg (Calculated) : 45.5 HEPARIN  DW (KG): 60.5  Vital Signs: Temp: 97.7 F (36.5 C) (06/10 2000) Temp Source: Oral (06/10 2000) BP: 91/68 (06/10 2100) Pulse Rate: 75 (06/10 2100)  Labs: Recent Labs    03/28/24 0526 03/29/24 0407 03/29/24 1257 03/29/24 1302 03/29/24 1303 03/29/24 1754 03/30/24 0452 03/30/24 1130 03/30/24 1536 03/30/24 2024 03/30/24 2224  HGB 11.9* 11.6*   < > 13.9 14.3  --  11.6*  --   --   --   --   HCT 38.5 36.8   < > 41.0 42.0  --  37.0  --   --   --   --   PLT 341 358  --   --   --   --  362  --   --   --   --   APTT  --   --   --   --   --   --   --  28  --  >200* 109*  LABPROT  --   --   --   --   --   --   --  18.0*  --   --   --   INR  --   --   --   --   --   --   --  1.5*  --   --   --   HEPARINUNFRC  --   --   --   --   --   --   --  >1.10*  --   --   --   CREATININE 2.34* 3.07*  --   --   --  3.14* 3.07*  --  2.88*  --   --    < > = values in this interval not displayed.    Estimated Creatinine Clearance: 13.9 mL/min (A) (by C-G formula based on SCr of 2.88 mg/dL (H)).   Medical History: Past Medical History:  Diagnosis Date   A-fib [facility])    CHF (congestive heart failure) (HCC)    Chronic heart failure with preserved ejection fraction (HFpEF) (HCC)    a. 11/2019 Echo: EF 60-65%; b. 07/2021 Echo: EF 60-65%.   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 07/2021 req 2u prbcs. Eval deferred given resolution of bleeding and cardiac issues.   Hyperlipidemia    Hypertension    Lymphedema    Morbid obesity (HCC)    Osteoarthritis of both  knees    Osteopenia    Pericardial effusion    a. 07/2021 Echo: Nl EF. Large pericardial effusion-->s/p pericardiocentesis of ; b. 08/23/2021 Echo: EF 60-65%, mild LVH, Nl RV fxn, sev dil LA, moderate pericardial effusion. L pleural effusion.   Permanent atrial fibrillation (HCC)    Pleural effusion, left    a. 07/2021 s/p thoracentesis - .    Medications:  Apixaban  5mg  BID.  Last dose: 6/8@0935   Assessment: 78 year old female patient with a past medical history of persistent A-fib, heart failure with preserved EF, CKD stage III, hyperlipidemia, hypertension, type 1 diabetes mellitus who presented to [facility] on 05/30 with  acute on chronic hypoxic respiratory failure due to CHF exacerbation. Patient with new diagnosis of CKD with possible need for dialysis port placement. Pharmacy has been consulted to initiate and monitor continuous heparin  infusion.   Goal of Therapy:  Heparin  level 0.3-0.7 units/ml aPTT 66-102 seconds Monitor platelets by anticoagulation protocol: Yes  06/10 2024 aPTT >200, RN drew off same arm - STAT redraw 06/10 2224 aPTT 109, slightly supratherapeutic   Plan:  Decrease heparin  infusion to 950 units/hr Check aPTT level in 8 hours  after rate change and HL daily Will continue to dose via aPTT until both levels correlate, then transition to HL dosing only Continue to monitor H&H and platelets  Coretta Dexter, PharmD, [facility] 03/30/2024 11:07 PM

## 2024-03-30 NOTE — Progress Notes (Signed)
 [facility] [place], [place] 03/30/24  Subjective:   Hospital day # 11   Patient known to our practice from outpatient follow-up of CKD.  She presented to the emergency room with worsening shortness of breath with exertion.  Normally she is able to drive and carry on activities of daily living with ease.  For the last 2 to 3 weeks and especially last 4 days she has been getting short of breath and having to rest even to go from 1 room to the next.  She has developed worsening lower extremity edema.  She is now presented to the hospital for further management.  Update Ill appearing Alert Remains on room air Trace peripheral edema  Levophed  and milrinone  present   Renal: 06/09 0701 - 06/10 0700 In: 460.8 [I.V.:251.2; IV Piggyback:209.6] Out: 550 [Urine:550] Lab Results  Component Value Date   CREATININE 3.07 (H) 03/30/2024   CREATININE 3.14 (H) 03/29/2024   CREATININE 3.07 (H) 03/29/2024     Objective:  Vital signs in last 24 hours:  Temp:  [97.5 F (36.4 C)-97.9 F (36.6 C)] 97.8 F (36.6 C) (06/10 0400) Pulse Rate:  [57-133] 90 (06/10 0700) Resp:  [11-29] 17 (06/10 0700) BP: (82-125)/(42-81) 94/68 (06/10 0700) SpO2:  [74 %-99 %] 94 % (06/10 0700)  Weight change: 0 kg Filed Weights   03/27/24 0500 03/29/24 0500 03/29/24 0945  Weight: 64.5 kg 68.8 kg 68.8 kg    Intake/Output:    Intake/Output Summary (Last 24 hours) at 03/30/2024 1007 Last data filed at 03/30/2024 0700 Gross per 24 hour  Intake 460.78 ml  Output 550 ml  Net -89.22 ml     Physical Exam: General: Ill-appearing  HEENT Moist mucous membranes  Pulm/lungs Normal breathing effort on room air  CVS/Heart Irregular rhythm  Abdomen:  Mildly distended  Extremities: trace pitting edema and lymphedema  Neurologic: Alert and oriented  Skin: No acute rashes          Basic Metabolic Panel:  Recent Labs  Lab 03/24/24 0427 03/25/24 0532 03/25/24 2232 03/26/24 0509  03/27/24 0402 03/28/24 0526 03/29/24 0407 03/29/24 0902 03/29/24 1257 03/29/24 1302 03/29/24 1303 03/29/24 1441 03/29/24 1754 03/30/24 0303 03/30/24 0452  NA 127* 127*   < > 128* 125* 123* 118*   < > 119* 121* 119* 121* 118* 119* 119*  K 3.4* 3.5   < > 4.0 3.1* 4.4 4.8  --  4.5 4.4 4.5  --  4.9  --  4.6  CL 85* 88*   < > 87* 82* 82* 78*  --   --   --   --   --  79*  --  80*  CO2 27 27   < > 30 26 24 26   --   --   --   --   --  24  --  24  GLUCOSE 168* 239*   < > 168* 153* 133* 149*  --   --   --   --   --  201*  --  204*  BUN 70* 71*   < > 72* 73* 79* 95*  --   --   --   --   --  93*  --  97*  CREATININE 2.15* 1.85*   < > 1.89* 1.90* 2.34* 3.07*  --   --   --   --   --  3.14*  --  3.07*  CALCIUM  9.2 8.9   < > 9.4 9.1 9.4 9.1  --   --   --   --   --  8.7*  --  8.8*  MG 2.4 2.3  --  2.6* 2.7*  --  3.6*  --   --   --   --   --   --   --  3.4*  PHOS 3.3 3.0  --   --   --   --  3.8  --   --   --   --   --   --   --   --    < > = values in this interval not displayed.     CBC: Recent Labs  Lab 03/26/24 0509 03/27/24 0402 03/28/24 0526 03/29/24 0407 03/29/24 1257 03/29/24 1302 03/29/24 1303 03/30/24 0452  WBC 9.9 9.4 8.4 8.3  --   --   --  7.7  HGB 11.4* 11.5* 11.9* 11.6* 14.3 13.9 14.3 11.6*  HCT 38.0 37.3 38.5 36.8 42.0 41.0 42.0 37.0  MCV 70.2* 69.2* 70.4* 68.9*  --   --   --  70.7*  PLT 313 309 341 358  --   --   --  362      Lab Results  Component Value Date   HEPBSAG NON REACTIVE 03/21/2024   HEPBSAB NON REACTIVE 04/11/2021   HEPBIGM NON REACTIVE 03/21/2024      Microbiology:  Recent Results (from the past 240 hours)  MRSA Next Gen by PCR, Nasal     Status: None   Collection Time: 03/22/24 12:53 PM   Specimen: Nasal Mucosa; Nasal Swab  Result Value Ref Range Status   MRSA by PCR Next Gen NOT DETECTED NOT DETECTED Final    Comment: (NOTE) The GeneXpert MRSA Assay (FDA approved for NASAL specimens only), is one component of a comprehensive MRSA  colonization surveillance program. It is not intended to diagnose MRSA infection nor to guide or monitor treatment for MRSA infections. Test performance is not FDA approved in patients less than 9 years old. Performed at [facility], [address] [place], [place] [address]     Coagulation Studies: No results for input(s): "LABPROT", "INR" in the last 72 hours.  Urinalysis: No results for input(s): "COLORURINE", "LABSPEC", "PHURINE", "GLUCOSEU", "HGBUR", "BILIRUBINUR", "KETONESUR", "PROTEINUR", "UROBILINOGEN", "NITRITE", "LEUKOCYTESUR" in the last 72 hours.  Invalid input(s): "APPERANCEUR"    Imaging: CARDIAC CATHETERIZATION Result Date: 03/29/2024 HEMODYNAMICS: RA:       16 mmHg (mean) RV:       40/5, 16 mmHg PA:       40/24 mmHg (29 mean) PCWP: 18 mmHg (mean)    Estimated Fick CO/CI   3.71L/min, 2.24L/min/m2    TPG  11  mmHg     PVR  2.97 Wood Units PAPi  1 IMPRESSION: Moderately elevated biventricular filling pressures Early cut off sign with equalization diastolic pressures LV and RV discordance (10-67mmHg) consistent with constrictive disease Moderate pulmonary hypertension with evidence of RV compromise Borderline cardiac output on norepinephrine  and 0.25 of milrinone  RECOMMENDATIONS: Unfortunately a poor surgical candidate given debility and worsening renal failure. Will attempt to optimize prior to CT surgery consult but suspect her prognosis is poor       Medications:    furosemide  120 mg (03/30/24 0945)   furosemide      norepinephrine  (LEVOPHED ) Adult infusion Stopped (03/30/24 6045)   vasopressin 0.03 Units/min (03/30/24 0943)    allopurinol   100 mg Oral Daily   Chlorhexidine  Gluconate Cloth  6 each Topical Daily   docusate sodium   100 mg Oral BID   feeding supplement  237 mL Oral BID BM  folic acid   1 mg Oral Daily   insulin  aspart  0-15 Units Subcutaneous TID WC   insulin  aspart  0-5 Units Subcutaneous QHS   insulin  aspart  3 Units Subcutaneous TID  WC   insulin  glargine-yfgn  10 Units Subcutaneous BID   latanoprost   1 drop Both Eyes QHS   pantoprazole   40 mg Oral BID   polyethylene glycol  17 g Oral Daily   rosuvastatin   5 mg Oral Weekly   senna  1 tablet Oral Daily   sodium chloride  flush  10-40 mL Intracatheter Q12H   acetaminophen , albuterol , bisacodyl , dextromethorphan-guaiFENesin , ondansetron  (ZOFRAN ) IV, mouth rinse, oxyCODONE , sodium chloride  flush, traMADol , traZODone   Assessment/ Plan:  78 y.o. female with  multiple medical problems including chronic kidney disease, hypertension, atrial fibrillation, hyperlipidemia, chronic lower extremity edema, diastolic dysfunction, diabetes, osteoarthritis of the knee, chronic back pain  admitted on 03/19/2024 for Shortness of breath [R06.02] Acute on chronic diastolic heart failure (HCC) [I50.33] Acute on chronic diastolic CHF (congestive heart failure) (HCC) [I50.33]  1.  Acute on chronic diastolic CHF, with worsening lower extremity edema and shortness of breath 2.  Acute kidney injury on chronic kidney disease stage IV.  Baseline creatinine 2.6/GFR 19 from April 2025. Creatinine has worsened to 2.65, likely secondary to aggressive diuresis. 3.  Hypokalemia 4.  Hyponatremia. 5.  Diabetes type 2 with CKD.  Hemoglobin A1c 7.4% from February 2025. 6.  Constipation  2D echo 03/20/2024-LVEF 60 to 65%, moderate concentric LVH, normal right ventricular systolic function small pericardial effusion, normal aortic valve, severe mitral annular calcification  Plan: RHC on 6/2 showed severely decreased cardiac output, biventricular failure with RV dysfunction. Cardiology feels this is end stage diastolic heart failure. Milrinone  drip with IV Furosemide .  Repeat RHC with [facility] which showed moderately elevated biventricular filling pressures, constrictive disease and moderate pulmonary hypertension.                                                                     May consider CRRT, if needed.  Currently prescribed IV Furosemide  and metolazone . Potassium corrected  Sodium 119.  Has received tolvaptan  during this admission. Continue Ensure supplementation twice daily Primary team to continue management of SSI  Continue bowel regimen.   LOS: [address] 6/10/202510:07 AM  [address] [place], [place] [phone]

## 2024-03-30 NOTE — Plan of Care (Addendum)
    Brief Palliative Medicine Progress Note:   PMT consult received and chart reviewed. Goals of care completed, full note to follow.   Recommendations: Continue full code, full scope of care Time for outcomes Ongoing goals of care conversations as clinical picture evolves and options become clear Ongoing supportive patient family Palliative medicine will continue to follow     Thank you for allowing us  to participate in the care of Karen Farley   Signed by: Lizbeth Right, NP Palliative Medicine Team [facility] CHARGE   Team Phone # [phone] (Nights/Weekends)  03/30/2024, 3:44 PM

## 2024-03-30 NOTE — Progress Notes (Signed)
 PHARMACY CONSULT NOTE  Pharmacy Consult for Electrolyte Monitoring and Replacement   Recent Labs: Potassium (mmol/L)  Date Value  03/30/2024 4.6   Magnesium  (mg/dL)  Date Value  16/07/9603 3.4 (H)   Calcium  (mg/dL)  Date Value  54/06/8118 8.8 (L)   Albumin  (g/dL)  Date Value  14/78/2956 3.4 (L)  12/19/2023 4.1   Phosphorus (mg/dL)  Date Value  21/30/8657 3.8   Sodium (mmol/L)  Date Value  03/30/2024 119 (LL)  12/19/2023 133 (L)   Assessment: 78 y.o. female with medical history significant of diastolic CHF, hypertension, hyperlipidemia, diabetes mellitus, PAD, gout, GI bleeding, CKD-3B, A-fib on Eliquis , chronic hyponatremia, chronic venous insufficiency, lymphedema, who presented with SOB and worsening leg edema. Pharmacy is asked to follow and replace electrolytes while in CCU  Relevant medications: Lasix  80 mg IV BID>>stopped 6/8, milrinone , NE, midodrine   Goal of Therapy:  Electrolytes WNL  Plan:  --Na 119, down-trending. 3% hypertonic saline bolus ordered by Advanced Heart Failure team --Q6h checks ordered(to be managed by cardiology/nephrology)  --Re-check electrolytes with AM labs tomorrow AM  Karen Farley, PharmD Clinical Pharmacist 03/30/2024 7:47 AM

## 2024-03-30 NOTE — Progress Notes (Signed)
 NAME:  Karen Farley, MRN:  [id number], DOB:  06-02-1946, LOS: 11 ADMISSION DATE:  03/19/2024, CONSULTATION DATE: 03/22/2024 REFERRING MD: Dr. Alvenia Aus, CHIEF COMPLAINT: Shortness of Breath   History of Present Illness:  This is a 78 yo female who presented to [facility] ER on 05/30 from home with abdominal distention, leg swelling, and shortness of breath with exertion onset of symptoms several weeks ago.  Pt reported she had been unable to tolerate laying flat at night due to shortness of breath.  At baseline she uses a walker to ambulate, but has only been able to take a few steps due to symptoms outlined above.  She also endorsed a 15 lb weight gain from her baseline over a 3 week period.  She has been compliant with her outpatient torsemide  and metolazone , and she recently has taken a double dose of her torsemide  recently.  Due to worsening symptoms pt proceeded to the ER for evaluation.  All hx obtained from EDP note.  ED Course Upon arrival to the ER significant lab results were: Na+ 127/glucose 159/BUN 76/creatinine 2.81/phosphorous 4.7/total bilirubin 2.1/BNP 265.4/troponin 26.  CT Chest revealed borderline cardiomegaly with mild diffuse pericardial thickening vs. small volume fluid; esophagitis vs. reflux; and suspected liver cirrhosis.  CXR negative for acute cardiopulmonary disease.  She received 40 mg iv lasix .  She was subsequently admitted to the telemetry unit for additional workup and treatment by hospitalist team.  See detailed hospital course below under significant events.    Pertinent  Medical History  Permanent Atrial Fibrillation  HFpEF CKD stage III Type I Diabetes Mellitus  HLD HTN  Lymphedema  Morbid Obesity  Osteopenia Pericardial Effusion   Significant Hospital Events: Including procedures, antibiotic start and stop dates in addition to other pertinent events   05/30: Pt admitted to the telemetry unit with acute on chronic respiratory failure secondary to acute on  chronic diastolic CHF  05/31: No significant ascites.  Lobular and heterogeneous appearance of the liver consistent        with history of cirrhosis.  Due to worsening renal failure lasix  held  05/31: Echo revealed 60 to 65%; echogenic material in the pericardium lateral to the RV apex that has been visualized on prior echo may represent fat pad vs. prior hemopericardium; small pericardial effusion without tamponade; mild mitral valve regurgitation; mild mitral stenosis; severe thickening of aortic valve  06/02: Right heart catheterization revealed severely elevated right atrial pressure, mild pulmonary hypertension, moderately elevated wedge pressure, and severely reduced cardiac output.  Pt transferred to ICU to start milrinone  gtt, iv diuresis, and levophed  gtt.  PCCM team consulted to assist with management.  06/03: Pt remains on levophed  and milrinone  gtts overnight UOP 1.3L  06/04: No significant events noted overnight.  Afebrile, remains on Milrinone  (0.25) and Levophed  (titrated down to 6 mcg), Coox is 66.4.  Creatinine slightly improved to 2.1 from 2.3, UOP 3.5 L last 24 hrs (net - 4.2 L) with aggressive diuresis.  Reports she was able to sleep with Tramadol  last night 06/05: No significant events noted overnight, afebrile. Remains on Milrinone  and Levophed  (7 mcg), Coox 63.9.  Creatinine improved to 1.8 from 2.1, UOP 2.2 L last 24 hrs (net - 5.9 L ) with diuresis, plan to continue per HF team.  Plan for Cardiac MRI to look for infiltrative disease.  Consult PT/OT 06/06: Pt remains on milrinone  and levophed  gtt @6  mcg/min  06/07: Remains on 6 mcg of norepinephrine  and milrinone  infusion 06/08: Remain on levophed   and milrinone  gtts  06/09: Worsening kidney function; due for right heart catheterization today 06/09: RHC yesterday; severe restrictive disease.  06/10: Milrinone  and norepinephrine  discontinued; vasopressin, furosemide , Zaroxolyn  and saline 3% ordered.  Interim History / Subjective:   Reports persistent fatigue.  Right heart catheterization done yesterday and showed severe restrictive disease.  Urine output continues to decline.  Total urine output for the last 24 hours is 250 cc.  Patient has -9 L in fluid balance.  Sodium level 119 and creatinine 3.07 this morning.  Blood sugars are fluctuating between 150 and 250. Reports gas and constipation this morning. No bowel movement yesterday and today. Reports poor appetite but states that she has been trying to drink protein shakes.  Objective   Blood pressure 94/68, pulse 90, temperature 97.8 F (36.6 C), temperature source Axillary, resp. rate 17, height 5' (1.524 m), weight 68.8 kg, SpO2 94%. CVP:  [8 mmHg-31 mmHg] 14 mmHg      Intake/Output Summary (Last 24 hours) at 03/30/2024 0911 Last data filed at 03/30/2024 0700 Gross per 24 hour  Intake 460.78 ml  Output 550 ml  Net -89.22 ml   Examination: General: Awake, Chronically-ill appearing female, NAD on RA  HEENT:[place]/AT, oral mucosa moist, PERRLA, neck is supple, no JVD  Lungs: Normal work of breathing, bilateral breath sounds in all 4 quadrants, diminished in the bases cardiovascular: Irregular irregular, no r/g, 2+ radial/1+ distal pulses, +1 nonpitting bilateral lower extremity edema  Abdomen: Abdomen is mildly distended, tympanic on percussion, +BS x4, obese, soft, mild epigastric discomfort palpation reveals no organomegaly.  Extremities: Moves all extremities, no joint deformities  Skin: No rashes or lesions present  Neuro: Alert and oriented, following commands, PERRLA  GU: Indwelling foley catheter draining yellow urine   Assessment and Plan:  #Acute on chronic diastolic CHF  #Moderate pulmonary hypertension with evidence of RV compromise #Permanent atrial fibrillation  #Small pericardial effusion  #Mitral regurgitation  #Cardiorenal syndrome  Hx: Pericardial effusion, HLD, and HTN  RHC 03/22/2024: showed severely decreased cardiac output, biventricular  failure with RV dysfunction. RHC 6/9 shows moderately elevated biventricular filling pressures, severe constrictive disease and moderate pulmonary hypertension. -Per HF team, patient is not a poor candidate for any surgical intervention due to debility and worsening renal failure. Her overall prognosis is poor. Plan is to diurese patient today with furosemide  240mg  iv, zaroxolyn  10 mg po x1 , correct hyponatremia with saline 3% boluses, d/c milrinone  and norepi and start vasopressin.  -Palliative care consulted; awaiting input - Continuous telemetry monitoring  - Cardiology, Heart Failure  and nephrology teams following - Trend co-ox  - Prn levophed  gtt to maintain map 65 or higher  - Continue outpatient rosuvastatin   -Continue to hold eliquis  and start heparin  infusion - Daily weights    #Hyponatremia-sodium level 119 this morning 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-worsening creatinine level is overall #Hypokalemia  -3% saline at 150 bolus x 1 -Trend BMP  - Replete electrolytes as indicated  - Strict I&O's - Avoid nephrotoxic agents as able  - Nephrology consulted appreciate input-plan is to initiate continuous renal replacement therapy tomorrow if no effect from the changes in treatment plan made today.   #Acute respiratory failure secondary to CHF exacerbation-resolved -As needed supplemental O2 for dyspnea and/or hypoxia  - Maintain O2 sats 92% or higher  - Prn bronchodilator therapy    #Constipation-improved - Continue aggressive bowel regimen  -High-fiber diet as tolerated -If no BM this evening, will give one dose of  MOM at 1700.    #H/o Cirrhosis  - Trend hepatic function panel  - Avoid hepatotoxic agents as able    #Type II diabetes mellitus  - CBG's ac/hs  - SSI, scheduled novolog , and scheduled semglee   - Follow hypo/hyperglycemic protocol  - Target range 140 to 180   Best Practice (right click and "Reselect all SmartList Selections" daily)   Diet/type: Regular consistency (see orders) DVT prophylaxis Apixaban   Pressure ulcer(s): N/A GI prophylaxis: PPI Lines: Yes and still needed  Foley: Yes and still needed  Code Status:  full code Last date of multidisciplinary goals of care discussion [03/29/2024]  06/10: Updated pt and husband at bedside regarding plan of care. She wants to explore every treatment option including a second opinion.    Labs   CBC: Recent Labs  Lab 03/26/24 0509 03/27/24 0402 03/28/24 0526 03/29/24 0407 03/29/24 1257 03/29/24 1302 03/29/24 1303 03/30/24 0452  WBC 9.9 9.4 8.4 8.3  --   --   --  7.7  HGB 11.4* 11.5* 11.9* 11.6* 14.3 13.9 14.3 11.6*  HCT 38.0 37.3 38.5 36.8 42.0 41.0 42.0 37.0  MCV 70.2* 69.2* 70.4* 68.9*  --   --   --  70.7*  PLT 313 309 341 358  --   --   --  362    Basic Metabolic Panel: Recent Labs  Lab 03/24/24 0427 03/25/24 0532 03/25/24 2232 03/26/24 0509 03/27/24 0402 03/28/24 0526 03/29/24 0407 03/29/24 0902 03/29/24 1257 03/29/24 1302 03/29/24 1303 03/29/24 1441 03/29/24 1754 03/30/24 0303 03/30/24 0452  NA 127* 127*   < > 128* 125* 123* 118*   < > 119* 121* 119* 121* 118* 119* 119*  K 3.4* 3.5   < > 4.0 3.1* 4.4 4.8  --  4.5 4.4 4.5  --  4.9  --  4.6  CL 85* 88*   < > 87* 82* 82* 78*  --   --   --   --   --  79*  --  80*  CO2 27 27   < > 30 26 24 26   --   --   --   --   --  24  --  24  GLUCOSE 168* 239*   < > 168* 153* 133* 149*  --   --   --   --   --  201*  --  204*  BUN 70* 71*   < > 72* 73* 79* 95*  --   --   --   --   --  93*  --  97*  CREATININE 2.15* 1.85*   < > 1.89* 1.90* 2.34* 3.07*  --   --   --   --   --  3.14*  --  3.07*  CALCIUM  9.2 8.9   < > 9.4 9.1 9.4 9.1  --   --   --   --   --  8.7*  --  8.8*  MG 2.4 2.3  --  2.6* 2.7*  --  3.6*  --   --   --   --   --   --   --  3.4*  PHOS 3.3 3.0  --   --   --   --  3.8  --   --   --   --   --   --   --   --    < > = values in this interval not displayed.   GFR: Estimated Creatinine Clearance:  13.1 mL/min (A) (by C-G formula based on SCr of 3.07 mg/dL (H)). Recent Labs  Lab 03/27/24 0402 03/28/24 0526 03/29/24 0407 03/30/24 0452  WBC 9.4 8.4 8.3 7.7    Liver Function Tests: Recent Labs  Lab 03/23/24 1446 03/28/24 0526  AST 22 21  ALT 12 16  ALKPHOS 80 88  BILITOT 3.0* 2.6*  PROT 6.4* 6.3*  ALBUMIN  3.8 3.4*   No results for input(s): "LIPASE", "AMYLASE" in the last 168 hours. No results for input(s): "AMMONIA" in the last 168 hours.  ABG    Component Value Date/Time   PHART 7.505 (H) 03/29/2024 1302   PCO2ART 32.4 03/29/2024 1302   PO2ART 65 (L) 03/29/2024 1302   HCO3 29.4 (H) 03/29/2024 1303   TCO2 31 03/29/2024 1303   ACIDBASEDEF 8.7 (H) 04/11/2021 0210   O2SAT 81.8 03/30/2024 0452     Coagulation Profile: No results for input(s): "INR", "PROTIME" in the last 168 hours.  Cardiac Enzymes: No results for input(s): "CKTOTAL", "CKMB", "CKMBINDEX", "TROPONINI" in the last 168 hours.  HbA1C: Hemoglobin A1C  Date/Time Value Ref Range Status  06/28/2016 12:00 AM 6.6  Final   HB A1C (BAYER DCA - WAIVED)  Date/Time Value Ref Range Status  12/19/2023 04:24 PM 7.4 (H) 4.8 - 5.6 % Final    Comment:             Prediabetes: 5.7 - 6.4          Diabetes: >6.4          Glycemic control for adults with diabetes: <7.0   09/02/2023 01:35 PM 6.3 (H) 4.8 - 5.6 % Final    Comment:             Prediabetes: 5.7 - 6.4          Diabetes: >6.4          Glycemic control for adults with diabetes: <7.0    Hgb A1c MFr Bld  Date/Time Value Ref Range Status  03/24/2024 04:27 AM 7.6 (H) 4.8 - 5.6 % Final    Comment:    (NOTE) Diagnosis of Diabetes The following HbA1c ranges recommended by the American Diabetes Association (ADA) may be used as an aid in the diagnosis of diabetes mellitus.  Hemoglobin             Suggested A1C NGSP%              Diagnosis  <5.7                   Non Diabetic  5.7-6.4                Pre-Diabetic  >6.4                    Diabetic  <7.0                   Glycemic control for                       adults with diabetes.      CBG: Recent Labs  Lab 03/28/24 2117 03/29/24 0719 03/29/24 1653 03/29/24 2129 03/30/24 0736  GLUCAP 250* 169* 145* 251* 155*    Review of Systems: Positives in BOLD   Gen: Denies fever, chills, weight change, but reports generalized fatigue HEENT: Denies blurred vision, double vision, hearing loss, tinnitus, sinus congestion, rhinorrhea, sore throat, neck stiffness, dysphagia PULM: Denies shortness of breath at rest but reports  shortness of breath with minimal exertion.  Denies cough, sputum production, hemoptysis, wheezing CV: chest pain, mild edema that is significantly improved, orthopnea, paroxysmal nocturnal dyspnea, palpitations GI: Denies abdominal pain, nausea, vomiting, diarrhea, hematochezia, melena, but reports constipation and gaseous distention of her abdomen, also reports of farting GU: Denies dysuria, hematuria, polyuria, oliguria, urethral discharge Endocrine: Denies hot or cold intolerance, polyuria, polyphagia or appetite change Derm: Denies rash, dry skin, scaling or peeling skin change Heme: Denies easy bruising, bleeding, bleeding gums Neuro: Denies headache, numbness, weakness, slurred speech, loss of memory or consciousness  Past Medical History:  She,  has a past medical history of A-fib (HCC), CHF (congestive heart failure) (HCC), Chronic heart failure with preserved ejection fraction (HFpEF) (HCC), 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   Surgical History:   Past Surgical History:  Procedure  Laterality Date   BASAL CELL CARCINOMA EXCISION  11/01/2022   CATARACT EXTRACTION W/PHACO Left 10/09/2021   Procedure: CATARACT EXTRACTION PHACO AND INTRAOCULAR LENS PLACEMENT (IOC) LEFT DIABETIC 4.84 00:37.8;  Surgeon: Clair Crews, MD;  Location: [facility];  Service: Ophthalmology;  Laterality: Left;  Diabetic   CATARACT EXTRACTION W/PHACO Right 10/30/2021   Procedure: CATARACT EXTRACTION PHACO AND INTRAOCULAR LENS PLACEMENT (IOC) RIGHT DIABETIC 5.65 00:38.5;  Surgeon: Clair Crews, MD;  Location: [facility];  Service: Ophthalmology;  Laterality: Right;  Diabetic   CHOLECYSTECTOMY     DILATION AND CURETTAGE OF UTERUS     IR FLUORO GUIDE CV LINE RIGHT  08/16/2021   IR THORACENTESIS ASP PLEURAL SPACE W/IMG GUIDE  08/16/2021   PERICARDIOCENTESIS N/A 08/05/2021   Procedure: PERICARDIOCENTESIS;  Surgeon: Percival Brace, MD;  Location: [facility];  Service: Cardiovascular;  Laterality: N/A;   RIGHT AND LEFT HEART CATH N/A 03/29/2024   Procedure: RIGHT AND LEFT HEART CATH;  Surgeon: Lauralee Poll, MD;  Location: [facility];  Service: Cardiovascular;  Laterality: N/A;   RIGHT HEART CATH N/A 03/22/2024   Procedure: RIGHT HEART CATH;  Surgeon: Wenona Hamilton, MD;  Location: [facility];  Service: Cardiovascular;  Laterality: N/A;   TEAR DUCT PROBING WITH STRABISMUS REPAIR Right    TONSILLECTOMY       Social History:   reports that she has never smoked. She has never used smokeless tobacco. She reports that she does not currently use alcohol. She reports that she does not use drugs.   Family History:  Her family history includes Breast cancer in her maternal grandmother and mother; Cancer in her brother and brother; Diabetes in her brother; Heart disease in her brother and mother; Heart disease (age of onset: 42) in her father; Osteoporosis in her mother; Parkinson's disease in her brother.   Allergies No Known Allergies   Home  Medications  Prior to Admission medications   Medication Sig Start Date End Date Taking? Authorizing Provider  allopurinol  (ZYLOPRIM ) 100 MG tablet Take 1 tablet (100 mg total) by mouth daily. 12/19/23  Yes Johnson, [name], DO  apixaban  (ELIQUIS ) 5 MG TABS tablet Take 1 tablet (5 mg total) by mouth 2 (two) times daily. 12/19/23  Yes Johnson, [name], DO  empagliflozin  (JARDIANCE ) 25 MG TABS tablet TAKE 1 TABLET BY MOUTH ONCE DAILY BEFOREBREAKFAST 12/19/23  Yes  Johnson, [name], DO  folic acid  (FOLVITE ) 1 MG tablet Take 1 tablet (1 mg total) by mouth daily. 09/30/23  Yes Johnson, [name], DO  insulin  glargine, 2 Unit Dial, (TOUJEO  MAX SOLOSTAR) 300 UNIT/ML Solostar Pen Inject 16-20 Units into the skin daily. 12/19/23 09/14/24 Yes Johnson, [name], DO  latanoprost  (XALATAN ) 0.005 % ophthalmic solution Place 1 drop into both eyes at bedtime.   Yes [provider]  metolazone  (ZAROXOLYN ) 2.5 MG tablet Take 2.5 mg by mouth once a week. Wednesday   Yes [provider]  metoprolol  succinate (TOPROL -XL) 25 MG 24 hr tablet Take 0.5 tablets (12.5 mg total) by mouth daily. 12/19/23  Yes Johnson, [name], DO  midodrine  (PROAMATINE ) 10 MG tablet TAKE 1 TABLET BY MOUTH 3 TIMES DAILY WITH MEALS 02/24/23  Yes Gollan, Timothy J, MD  pantoprazole  (PROTONIX ) 40 MG tablet Take 1 tablet (40 mg total) by mouth 2 (two) times daily. 12/19/23  Yes Johnson, [name], DO  potassium chloride  SA (KLOR-CON  M) 20 MEQ tablet Take 2 tablets (40 mEq total) by mouth 2 (two) times daily. 12/19/23  Yes Johnson, [name], DO  rosuvastatin  (CRESTOR ) 5 MG tablet Take 1 tablet (5 mg total) by mouth once a week. 05/30/23  Yes Johnson, [name], DO  Semaglutide  (RYBELSUS ) 7 MG TABS Take 1 tablet (7 mg total) by mouth daily. 12/19/23  Yes Johnson, [name], DO  spironolactone  (ALDACTONE ) 50 MG tablet Take 1 tablet (50 mg total) by mouth daily. 12/19/23  Yes Johnson, [name], DO  torsemide  (DEMADEX ) 20 MG tablet TAKE 1 TO 2 TABLETS BY MOUTH  DAILY. ONLY TAKE 2 TABLETS FOR 1 TO 3 DAYS AND IF PERSISTS, CALL WITH BAD SWELLING 12/19/23  Yes Johnson, [name], DO  Continuous Glucose Sensor (DEXCOM G7 SENSOR) MISC by Does not apply route.    [provider]  Glucagon  (GVOKE HYPOPEN  2-PACK) 0.5 MG/0.1ML SOAJ Inject 1 each into the skin daily as needed. Patient not taking: Reported on 03/19/2024 03/14/23   Terre Ferri P, DO  Insulin  Pen Needle (PEN NEEDLES) 32G X 4 MM MISC 1 each by Does not apply route 3 (three) times daily. Patient not taking: Reported on 02/24/2024 01/30/23   Terre Ferri P, DO  Insulin  Pen Needle (ULTRACARE PEN NEEDLES) 32G X 4 MM MISC USE 1 PEN ONCE DAILY Patient not taking: Reported on 02/24/2024 10/11/22   Terre Ferri P, DO  Insulin  Syringe-Needle U-100 (INSULIN  SYRINGE 1CC/31GX5/16") 31G X 5/16" 1 ML MISC 1 each by Does not apply route 3 (three) times daily. Patient not taking: Reported on 02/24/2024 12/13/22   Terre Ferri P, DO  Insulin  Syringe/Needle U-500 (BD INSULIN  SYRINGE U-500) 31G X 0.5 ML MISC 1 each by Does not apply route 3 (three) times daily as needed. Patient not taking: Reported on 02/24/2024 03/14/23   Terre Ferri P, DO  metroNIDAZOLE (METROGEL) 1 % gel Apply to face every morning. Patient not taking: Reported on 03/19/2024 09/19/22   [provider]  NEEDLE, DISP, 30 G (BD DISP NEEDLES) 30G X 1/2" MISC by Does not apply route. Patient not taking: Reported on 02/24/2024    [provider]     Critical care time: 45 minutes    Marcquis Ridlon S. Tukov-Yual, ANP-BC Pulmonary and Critical Care Medicine Prefer epic messenger for cross cover needs or call ICU at [phone] If after hours, please call E-link.  NB: This document was prepared using Dragon voice recognition software and may include unintentional  dictation errors.

## 2024-03-31 DIAGNOSIS — I9589 Other hypotension: Secondary | ICD-10-CM

## 2024-03-31 DIAGNOSIS — I3139 Other pericardial effusion (noninflammatory): Secondary | ICD-10-CM | POA: Diagnosis not present

## 2024-03-31 DIAGNOSIS — I5033 Acute on chronic diastolic (congestive) heart failure: Secondary | ICD-10-CM | POA: Diagnosis not present

## 2024-03-31 DIAGNOSIS — Z515 Encounter for palliative care: Secondary | ICD-10-CM

## 2024-03-31 DIAGNOSIS — I4821 Permanent atrial fibrillation: Secondary | ICD-10-CM | POA: Diagnosis not present

## 2024-03-31 DIAGNOSIS — Z7189 Other specified counseling: Secondary | ICD-10-CM | POA: Diagnosis not present

## 2024-03-31 DIAGNOSIS — R0602 Shortness of breath: Secondary | ICD-10-CM

## 2024-03-31 DIAGNOSIS — Z66 Do not resuscitate: Secondary | ICD-10-CM

## 2024-03-31 DIAGNOSIS — I272 Pulmonary hypertension, unspecified: Secondary | ICD-10-CM | POA: Diagnosis not present

## 2024-03-31 DIAGNOSIS — Z789 Other specified health status: Secondary | ICD-10-CM

## 2024-03-31 LAB — GLUCOSE, CAPILLARY
Glucose-Capillary: 108 mg/dL — ABNORMAL HIGH (ref 70–99)
Glucose-Capillary: 109 mg/dL — ABNORMAL HIGH (ref 70–99)
Glucose-Capillary: 89 mg/dL (ref 70–99)
Glucose-Capillary: 58 mg/dL — ABNORMAL LOW (ref 70–99)
Glucose-Capillary: 80 mg/dL (ref 70–99)

## 2024-03-31 LAB — COOXEMETRY PANEL
Carboxyhemoglobin: 1.5 % (ref 0.5–1.5)
Methemoglobin: 1.1 % (ref 0.0–1.5)
O2 Saturation: 63.9 %
Total hemoglobin: 10.7 g/dL — ABNORMAL LOW (ref 12.0–16.0)
Total oxygen content: 62.2 %

## 2024-03-31 LAB — BASIC METABOLIC PANEL WITH GFR
Anion gap: 15 (ref 5–15)
Anion gap: 13 (ref 5–15)
BUN: 97 mg/dL — ABNORMAL HIGH (ref 8–23)
BUN: 94 mg/dL — ABNORMAL HIGH (ref 8–23)
CO2: 26 mmol/L (ref 22–32)
CO2: 27 mmol/L (ref 22–32)
Calcium: 8.8 mg/dL — ABNORMAL LOW (ref 8.9–10.3)
Calcium: 8.8 mg/dL — ABNORMAL LOW (ref 8.9–10.3)
Chloride: 82 mmol/L — ABNORMAL LOW (ref 98–111)
Chloride: 86 mmol/L — ABNORMAL LOW (ref 98–111)
Creatinine, Ser: 2.94 mg/dL — ABNORMAL HIGH (ref 0.44–1.00)
Creatinine, Ser: 2.67 mg/dL — ABNORMAL HIGH (ref 0.44–1.00)
GFR, Estimated: 16 mL/min — ABNORMAL LOW (ref >60)
GFR, Estimated: 18 mL/min — ABNORMAL LOW (ref >60)
Glucose, Bld: 97 mg/dL (ref 70–99)
Glucose, Bld: 88 mg/dL (ref 70–99)
Potassium: 2.9 mmol/L — ABNORMAL LOW (ref 3.5–5.1)
Potassium: 5.4 mmol/L — ABNORMAL HIGH (ref 3.5–5.1)
Sodium: 123 mmol/L — ABNORMAL LOW (ref 135–145)
Sodium: 125 mmol/L — ABNORMAL LOW (ref 135–145)

## 2024-03-31 LAB — SODIUM
Sodium: 124 mmol/L — ABNORMAL LOW (ref 135–145)
Sodium: 123 mmol/L — ABNORMAL LOW (ref 135–145)
Sodium: 125 mmol/L — ABNORMAL LOW (ref 135–145)

## 2024-03-31 LAB — CBC
HCT: 36.9 % (ref 36.0–46.0)
Hemoglobin: 11.5 g/dL — ABNORMAL LOW (ref 12.0–15.0)
MCH: 22 pg — ABNORMAL LOW (ref 26.0–34.0)
MCHC: 31.2 g/dL (ref 30.0–36.0)
MCV: 70.6 fL — ABNORMAL LOW (ref 80.0–100.0)
Platelets: 283 10*3/uL (ref 150–400)
RBC: 5.23 MIL/uL — ABNORMAL HIGH (ref 3.87–5.11)
RDW: 22.3 % — ABNORMAL HIGH (ref 11.5–15.5)
WBC: 6.2 10*3/uL (ref 4.0–10.5)
nRBC: 0 % (ref 0.0–0.2)

## 2024-03-31 LAB — MAGNESIUM: Magnesium: 3.1 mg/dL — ABNORMAL HIGH (ref 1.7–2.4)

## 2024-03-31 LAB — HEPARIN LEVEL (UNFRACTIONATED): Heparin Unfractionated: >1.1 [IU]/mL — ABNORMAL HIGH (ref 0.30–0.70)

## 2024-03-31 LAB — POTASSIUM: Potassium: 3.3 mmol/L — ABNORMAL LOW (ref 3.5–5.1)

## 2024-03-31 LAB — APTT: aPTT: 127 s — ABNORMAL HIGH (ref 24–36)

## 2024-03-31 LAB — PHOSPHORUS: Phosphorus: 4.7 mg/dL — ABNORMAL HIGH (ref 2.5–4.6)

## 2024-03-31 MED ORDER — POTASSIUM CHLORIDE 10 MEQ/50ML IV SOLN
10.0000 meq | INTRAVENOUS | Status: DC
  Administered 2024-03-31: 10 meq via INTRAVENOUS
  Filled 2024-03-31 (×4): qty 50

## 2024-03-31 MED ORDER — POTASSIUM CHLORIDE CRYS ER 20 MEQ PO TBCR: 40.0000 meq | EXTENDED_RELEASE_TABLET | ORAL | Status: DC

## 2024-03-31 MED ORDER — SODIUM CHLORIDE 3 % IV BOLUS
150.0000 mL | Freq: Once | INTRAVENOUS | Status: DC
  Filled 2024-03-31: qty 500

## 2024-03-31 MED ORDER — POTASSIUM CHLORIDE 10 MEQ/50ML IV SOLN
10.0000 meq | INTRAVENOUS | Status: AC
  Administered 2024-03-31 (×2): 10 meq via INTRAVENOUS
  Filled 2024-03-31 (×2): qty 50

## 2024-03-31 MED ORDER — APIXABAN 5 MG PO TABS
5.0000 mg | ORAL_TABLET | Freq: Two times a day (BID) | ORAL | Status: DC
  Administered 2024-03-31 – 2024-04-03 (×5): 5 mg via ORAL
  Filled 2024-03-31 (×7): qty 1

## 2024-03-31 MED ORDER — POTASSIUM CHLORIDE 10 MEQ/50ML IV SOLN
10.0000 meq | Freq: Once | INTRAVENOUS | Status: DC
  Filled 2024-03-31: qty 50

## 2024-03-31 MED ORDER — POTASSIUM CHLORIDE CRYS ER 20 MEQ PO TBCR
40.0000 meq | EXTENDED_RELEASE_TABLET | ORAL | Status: AC
  Administered 2024-03-31: 40 meq via ORAL
  Filled 2024-03-31: qty 2

## 2024-03-31 MED ORDER — POTASSIUM CHLORIDE 10 MEQ/50ML IV SOLN
10.0000 meq | INTRAVENOUS | Status: DC
  Filled 2024-03-31 (×2): qty 50

## 2024-03-31 MED ORDER — POTASSIUM CHLORIDE 10 MEQ/50ML IV SOLN
10.0000 meq | INTRAVENOUS | Status: DC
  Filled 2024-03-31 (×3): qty 50

## 2024-03-31 MED ORDER — POTASSIUM CHLORIDE 20 MEQ PO PACK
40.0000 meq | PACK | Freq: Once | ORAL | Status: AC
  Administered 2024-03-31: 40 meq via ORAL
  Filled 2024-03-31: qty 2

## 2024-03-31 MED ORDER — SODIUM CHLORIDE 3 % IV BOLUS
150.0000 mL | Freq: Once | INTRAVENOUS | Status: AC
  Administered 2024-03-31: 150 mL via INTRAVENOUS
  Filled 2024-03-31: qty 500

## 2024-03-31 NOTE — Progress Notes (Signed)
 PHARMACY CONSULT NOTE  Pharmacy Consult for Electrolyte Monitoring and Replacement   Recent Labs: Potassium (mmol/L)  Date Value  03/31/2024 2.9 (L)   Magnesium  (mg/dL)  Date Value  30/86/5784 3.1 (H)   Calcium  (mg/dL)  Date Value  69/62/9528 8.8 (L)   Albumin  (g/dL)  Date Value  41/32/4401 3.4 (L)  12/19/2023 4.1   Phosphorus (mg/dL)  Date Value  02/72/5366 4.7 (H)   Sodium (mmol/L)  Date Value  03/31/2024 123 (L)  12/19/2023 133 (L)   Assessment: 78 y.o. female with medical history significant of diastolic CHF, hypertension, hyperlipidemia, diabetes mellitus, PAD, gout, GI bleeding, CKD-3B, A-fib on Eliquis , chronic hyponatremia, chronic venous insufficiency, lymphedema, who presented with SOB and worsening leg edema. Pharmacy is asked to follow and replace electrolytes while in CCU  Relevant medications: Lasix  80 mg IV BID>>stopped 6/8, milrinone , NE, midodrine   Goal of Therapy:  Electrolytes WNL  Plan:  --Na 123, slowly improving. Being managed by Advanced Heart Failure team --Kcl 10mEq IV x 5 ordered by CCNP --Q6h sodium checks ordered(to be managed by cardiology/nephrology)  --Re-check electrolytes with AM labs tomorrow AM  Cristal Don, PharmD Clinical Pharmacist 03/31/2024 7:27 AM

## 2024-03-31 NOTE — Progress Notes (Signed)
 OT Cancellation Note  Patient Details Name: Karen Farley MRN: [id number] DOB: 07/05/1946   Cancelled Treatment:    Reason Eval/Treat Not Completed: Fatigue/lethargy limiting ability to participate. Chart reviewed, discussed with RN. Pt currently very fatigued and has visitors. Requests for OT to return at later time. OT will continue to follow and see tomorrow if appropriate.   Chanique Duca L. Kaylin Schellenberg, OTR/L  03/31/24, 3:21 PM

## 2024-03-31 NOTE — Progress Notes (Signed)
 Daily Progress Note   Date: 03/31/2024   Patient Name: Karen Farley  DOB: 1946-08-28  MRN: [id number]  Age / Sex: 78 y.o., female  Attending Physician: Annitta Kindler, MD Primary Care Physician: Solomon Dupre, DO Admit Date: 03/19/2024 Length of Stay: 12 days  Reason for Consultation: Establishing goals of care  Past Medical History:  Diagnosis Date   A-fib [facility])    CHF (congestive heart failure) (HCC)    Chronic heart failure with preserved ejection fraction (HFpEF) (HCC)    a. 11/2019 Echo: EF 60-65%; b. 07/2021 Echo: EF 60-65%.   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 07/2021 req 2u prbcs. Eval deferred given resolution of bleeding and cardiac issues.   Hyperlipidemia    Hypertension    Lymphedema    Morbid obesity (HCC)    Osteoarthritis of both knees    Osteopenia    Pericardial effusion    a. 07/2021 Echo: Nl EF. Large pericardial effusion-->s/p pericardiocentesis of ; b. 08/23/2021 Echo: EF 60-65%, mild LVH, Nl RV fxn, sev dil LA, moderate pericardial effusion. L pleural effusion.   Permanent atrial fibrillation (HCC)    Pleural effusion, left    a. 07/2021 s/p thoracentesis - .    Subjective:   Subjective: Chart Reviewed. Updates received. Patient Assessed. Created space and opportunity for patient  and family to explore thoughts and feelings regarding current medical situation.  Today's Discussion: Today before meeting with the patient/family, I reviewed the chart including PCCM note, nephrology note, PT note from today.  Essentially the patient remains a poor surgical candidate and the focus is on medical support of her cardiac function.  She is diuresed approximately 3 L over the past 24 hours and plan for diuretic break because of  hypokalemia today.  Attempting to wean vasopressors, patient remains agreeable to HD.  Labs today show persistent hyponatremia, but stable at 1.4 today.  CBC with continued no leukocytosis, stable hemoglobin.  BMP with persistent AKI on CKD though hemoglobin stable at 2.94, GFR estimated at 16.,  Cooximetry off milrinone  today is 63.9% flowsheets reviewed and the patient is continuing to make urine.  Today went into the room nephrology which is answered.  They discussed that she is continuing to make urine and they will hold on hemodialysis as long as this continues to be the case.  Today the patient is resting in the bed, appears quite frail and fatigued.  Patient's husband and brother are also present.  She states that today she is really really tired.  Husband points out that she was able to sit edge of bed for about 30 minutes and did stand briefly.  We spent a lot of time talking about the gravity of situation, unlikely surgical candidate.  Patient states she is not sure if she would want open heart surgery if she is not getting any better.  She thinks that she is too weak.  I shared that this is a very wise insight.  I offered that, and complete honesty and transparency, if she did not get any stronger they likely would not offer surgery.  We also discussed her appetite and she states that she has not been hungry at all.  Husband shares that she had a little bit of chicken  noodle soup yesterday and three quarters of a cup of cottage cheese, along with 2-3 Ensure.  We went back to discussing whether or not she would get stronger.  I reiterated her statement that she did not get any stronger in the next couple days but she would not want to open her surgery.  I also reminded her that they would not likely offer surgery if she did not get stronger.  We spent some time discussing the other alternative that if she did not get better and she did not want open heart surgery, we would likely recommend to  transition to comfort care.  I explained comfort care as care where the patient would no longer receive aggressive medical interventions such as continuous vital signs, lab work, radiology testing, or medications not focused on comfort, peace, and dignity. This includes stopping antibiotics and weaning oxygen to room air, as these are generally not accepted as providing comfort but only prolonging the dying process artificially. All care would focus on how the patient is looking and feeling. This would include management of any symptoms that may cause discomfort, pain, shortness of breath/air hunger, increased work of breathing, cough, nausea, agitation/restlessness, anxiety, and/or secretions etc. Symptoms would be managed with medications and other non-pharmacological interventions such as spiritual support if requested, repositioning, music therapy, or therapeutic listening. Family verbalized understanding and agreement.  She states that this would likely be her choice if she did not improve.  At this point we again brought up CODE STATUS to follow-up from yesterday's conversation.  After discussion of the statistical outcomes related to cardiopulmonary resuscitation in patients in similar clinical situations, the patient was very quick to say that she would not want resuscitation.  Has been agrees stating that it is her choice on her medical decisions.  I shared that I would change her status from full code to DNR-limited, they are in agreement.  I do number conversation we agreed to allow some time for outcomes.  We anticipated that if no improvement in 48 hours we will rediscuss the idea of comfort care understanding that after this much time with no improvement she is unlikely to get better.  At that point she would like to focus on quality of life and spending time with her husband.  I shared that somebody from palliative medicine would follow-up in 2 days (Friday) for ongoing discussions and  decision making.  I confirm they have our contact information and can call us  for any questions or concerns in the meantime.  I shared that I would also reach out to the medical team with a summary of our conversation.  I provided emotional and general support through therapeutic listening, empathy, sharing of stories, therapeutic touch, and other techniques. I answered all questions and addressed all concerns to the best of my ability.  Afterward I debrided with the medical team return to conversation summary.  Medical team is on board and appreciated but changed to DNR and time for outcomes, follow-up discussions on problem.  Review of Systems  Constitutional:  Positive for appetite change and fatigue.  Respiratory:  Positive for shortness of breath.   Gastrointestinal:  Negative for abdominal pain, nausea and vomiting.  Neurological:  Positive for weakness.    Objective:   Primary Diagnoses: Present on Admission:  Acute on chronic diastolic heart failure (HCC)  Type 2 diabetes mellitus with renal complication (HCC)  Hyperlipidemia  Hyponatremia  HTN (hypertension)  Acute renal failure superimposed on stage 3b chronic kidney disease (  HCC)  Overweight (BMI 25.0-29.9)  Myocardial injury  Atrial fibrillation, chronic (HCC)  Acute on chronic diastolic CHF (congestive heart failure) (HCC)   Vital Signs:  BP 99/67   Pulse 81   Temp 97.7 F (36.5 C) (Oral)   Resp (!) 24   Ht 5' (1.524 m)   Wt 69 kg   SpO2 100%   BMI 29.71 kg/m   Physical Exam Vitals and nursing note reviewed.  Constitutional:      General: She is not in acute distress.    Appearance: She is ill-appearing.     Comments: Appears very frail, weak, tired  HENT:     Head: Normocephalic and atraumatic.  Cardiovascular:     Rate and Rhythm: Normal rate. Rhythm irregular.  Pulmonary:     Effort: Pulmonary effort is normal. No respiratory distress.  Abdominal:     General: Abdomen is flat.  Skin:    General:  Skin is warm and dry.  Neurological:     General: No focal deficit present.     Mental Status: She is alert.  Psychiatric:        Mood and Affect: Mood normal.        Behavior: Behavior normal.     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 Individuals present for the conversation: The patient, her husband, her brother, and this NP  Summary of the conversation: We discussed CODE STATUS and statistical outcomes in patients with a similar clinical picture.  We discussed continued aggressive care versus transition to comfort care.  We discussed unlikely surgical candidate and patient unsure if she would want surgery if she did not get significantly better in the next couple days.  Outcome of the conversations and/or documents completed: Changed to DNR-limited (DNR/DNI), time for outcomes (likely 48 hours) and if no improvement would likely lean towards transition to comfort care at that time.  I spent 30 minutes providing separately identifiable ACP services with the patient and/or surrogate decision maker in a voluntary, in-person conversation discussing the patient's wishes and goals as detailed in the above note.  Assessment & Plan:   HPI/Patient Profile:  78 y.o. female  with past medical history of permanent A-fib, HFpEF, CKD stage III, type 1 diabetes, HLD, HTN, pericardial effusion who presented with dyspnea on exertion, abdominal distention, leg edema for several weeks.  She was admitted on 03/19/2024 with acute on chronic diastolic heart failure, pulmonary hypertension with evidence of RV compromise, pericardial effusion, cardiorenal syndrome with AKI on CKD, hyponatremia,  respiratory failure secondary to CHF exacerbation, and others.    Palliative medicine was consulted for GOC conversations.  SUMMARY OF RECOMMENDATIONS   Changed to DNR-limited Continue current scope of care/aggressive care Time for outcomes, approximately 2 days If no improvement in 2 days would likely consider transition to comfort care to focus on quality of life at end-of-life Palliative medicine will follow-up in 2 days (04/02/2024) for ongoing conversations and decision making Please notify us  of any significant clinical change or new palliative needs in the interim  Symptom Management:  Per primary team PMP is available to assist as needed  Code Status: DNR-limited  Prognosis: Unable to determine-pending decision making on Friday  Discharge Planning: To Be Determined  Discussed with:  Patient, patient's family, medical team, bedside nursing  Thank you for allowing us  to participate in the care of [facility] PMT will continue to support holistically.  Billing based on MDM: High  Problems Addressed: One acute or chronic illness or injury that poses a threat to life or bodily function  Risks: Decision not to resuscitate or to de-escalate care because of poor prognosis   Lizbeth Right, NP Palliative Medicine Team  Team Phone # [phone] (Nights/Weekends)  06/19/2021, 8:17 AM

## 2024-03-31 NOTE — Progress Notes (Signed)
 NAME:  Karen Farley, MRN:  [id number], DOB:  08-23-1946, LOS: 12 ADMISSION DATE:  03/19/2024, CONSULTATION DATE: 03/22/2024 REFERRING MD: Dr. Alvenia Aus, CHIEF COMPLAINT: Shortness of Breath   History of Present Illness:  This is a 78 yo female who presented to [facility] ER on 05/30 from home with abdominal distention, leg swelling, and shortness of breath with exertion onset of symptoms several weeks ago.  Pt reported she had been unable to tolerate laying flat at night due to shortness of breath.  At baseline she uses a walker to ambulate, but has only been able to take a few steps due to symptoms outlined above.  She also endorsed a 15 lb weight gain from her baseline over a 3 week period.  She has been compliant with her outpatient torsemide  and metolazone , and she recently has taken a double dose of her torsemide  recently.  Due to worsening symptoms pt proceeded to the ER for evaluation.  All hx obtained from EDP note.  ED Course Upon arrival to the ER significant lab results were: Na+ 127/glucose 159/BUN 76/creatinine 2.81/phosphorous 4.7/total bilirubin 2.1/BNP 265.4/troponin 26.  CT Chest revealed borderline cardiomegaly with mild diffuse pericardial thickening vs. small volume fluid; esophagitis vs. reflux; and suspected liver cirrhosis.  CXR negative for acute cardiopulmonary disease.  She received 40 mg iv lasix .  She was subsequently admitted to the telemetry unit for additional workup and treatment by hospitalist team.  See detailed hospital course below under significant events.    Pertinent  Medical History  Permanent Atrial Fibrillation  HFpEF CKD stage III Type I Diabetes Mellitus  HLD HTN  Lymphedema  Morbid Obesity  Osteopenia Pericardial Effusion   Significant Hospital Events: Including procedures, antibiotic start and stop dates in addition to other pertinent events   05/30: Pt admitted to the telemetry unit with acute on chronic respiratory failure secondary to acute on  chronic diastolic CHF  05/31: No significant ascites.  Lobular and heterogeneous appearance of the liver consistent        with history of cirrhosis.  Due to worsening renal failure lasix  held  05/31: Echo revealed 60 to 65%; echogenic material in the pericardium lateral to the RV apex that has been visualized on prior echo may represent fat pad vs. prior hemopericardium; small pericardial effusion without tamponade; mild mitral valve regurgitation; mild mitral stenosis; severe thickening of aortic valve  06/02: Right heart catheterization revealed severely elevated right atrial pressure, mild pulmonary hypertension, moderately elevated wedge pressure, and severely reduced cardiac output.  Pt transferred to ICU to start milrinone  gtt, iv diuresis, and levophed  gtt.  PCCM team consulted to assist with management.  06/03: Pt remains on levophed  and milrinone  gtts overnight UOP 1.3L  06/04: No significant events noted overnight.  Afebrile, remains on Milrinone  (0.25) and Levophed  (titrated down to 6 mcg), Coox is 66.4.  Creatinine slightly improved to 2.1 from 2.3, UOP 3.5 L last 24 hrs (net - 4.2 L) with aggressive diuresis.  Reports she was able to sleep with Tramadol  last night 06/05: No significant events noted overnight, afebrile. Remains on Milrinone  and Levophed  (7 mcg), Coox 63.9.  Creatinine improved to 1.8 from 2.1, UOP 2.2 L last 24 hrs (net - 5.9 L ) with diuresis, plan to continue per HF team.  Plan for Cardiac MRI to look for infiltrative disease.  Consult PT/OT 06/06: Pt remains on milrinone  and levophed  gtt @6  mcg/min  06/07: Remains on 6 mcg of norepinephrine  and milrinone  infusion 06/08: Remain on levophed   and milrinone  gtts  06/11: Pt currently requiring vasopressin gtt @0 .03 units/min to maintain map >65.  Diuresed well overnight UOP 2.5L and currently net negative 12L this admission   Interim History / Subjective:  Pt endorses some shortness of breath at rest and continued bloating.   States she hasn't had a BM since 06/09  Objective    Blood pressure 100/69, pulse 73, temperature 97.7 F (36.5 C), temperature source Oral, resp. rate (!) 22, height 5' (1.524 m), weight 69 kg, SpO2 99%. CVP:  [0 mmHg-80 mmHg] 80 mmHg      Intake/Output Summary (Last 24 hours) at 03/31/2024 0817 Last data filed at 03/31/2024 1478 Gross per 24 hour  Intake 455.63 ml  Output 3450 ml  Net -2994.37 ml   Examination: General: Chronically-ill appearing female, NAD on 2L O2 via nasal canula   HENT: Supple, no JVD  Lungs: Clear throughout, even, non labored  Cardiovascular: Irregular irregular, no r/g, 2+ radial/1+ distal pulses, trace bilateral lower extremity edema  Abdomen: +BS x4, obese, soft, non tender, non distended  Extremities: Moves all extremities, normal bulk and tone  Skin: No rashes or lesions present  Neuro: Alert and oriented, following commands, PERRLA  GU: Indwelling foley catheter draining yellow urine   Assessment and Plan:   #Acute chronic diastolic CHF  #Mild pulmonary hypertension  #Permanent atrial fibrillation  #Small pericardial effusion  #Mitral regurgitation  #Cardiorenal syndrome  #Constrictive pericarditis  Hx: Pericardial effusion, HLD, and HTN  RHC 03/22/2024: showed severely decreased cardiac output, biventricular failure with RV dysfunction.  - Continuous telemetry monitoring  - Cardiology and Heart Failure Team consulted appreciate input: RHC revealed constrictive pericarditis, however pt deemed not a surgical candidate given her significant debility and renal function.  Pts family interested in transfer to [facility] for second opinion per heart failure teams progress notes.  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   #Hypokalemia  - Trend BMP  - Replete electrolytes as indicated  - Strict I&O's - Avoid nephrotoxic agents as able  - Nephrology consulted appreciate input    #Acute respiratory failure secondary to CHF exacerbation  - Supplemental O2 for dyspnea and/or hypoxia  - Maintain O2 sats 92% or higher  - Prn bronchodilator therapy    #Constipation  - Continue aggressive bowel regimen    #H/o Cirrhosis  - Trend hepatic function panel  - Avoid hepatotoxic agents as able    #Type II diabetes mellitus  - CBG's ac/hs  - SSI, scheduled novolog , and scheduled semglee   - Follow hypo/hyperglycemic protocol  - Target range 140 to 180   Palliative care consulted to assist with goals of care.  Given poor cardiac function with minimal treatment options recommend change code status to DNR/DNI  Best Practice (right click and Reselect all SmartList Selections daily)  Diet/type: Regular consistency (see orders) DVT prophylaxis Heparin  gtt   Pressure ulcer(s): N/A GI prophylaxis: PPI Lines: Yes and still needed  Foley: Yes and still needed  Code Status:  full code Last date of multidisciplinary goals of care discussion [03/31/2024]  06/11: Updated pt at bedside regarding plan of care.  Labs   CBC: Recent Labs  Lab 03/27/24 0402 03/28/24 0526 03/29/24 0407 03/29/24 1257 03/29/24 1302 03/29/24 1303 03/30/24  8469 03/31/24 0424  WBC 9.4 8.4 8.3  --   --   --  7.7 6.2  HGB 11.5* 11.9* 11.6* 14.3 13.9 14.3 11.6* 11.5*  HCT 37.3 38.5 36.8 42.0 41.0 42.0 37.0 36.9  MCV 69.2* 70.4* 68.9*  --   --   --  70.7* 70.6*  PLT 309 341 358  --   --   --  362 283    Basic Metabolic Panel: Recent Labs  Lab 03/25/24 0532 03/25/24 2232 03/26/24 0509 03/27/24 0402 03/28/24 0526 03/29/24 0407 03/29/24 0902 03/29/24 1303 03/29/24 1441 03/29/24 1754 03/30/24 0303 03/30/24 0452 03/30/24 1536 03/30/24 2024 03/31/24 0233 03/31/24 0424  NA 127*   < > 128* 125*   < > 118*   < > 119*   < > 118*    < > 119* 124* 123* 124* 123*  K 3.5   < > 4.0 3.1*   < > 4.8   < > 4.5  --  4.9  --  4.6 3.9  --   --  2.9*  CL 88*   < > 87* 82*   < > 78*  --   --   --  79*  --  80* 85*  --   --  82*  CO2 27   < > 30 26   < > 26  --   --   --  24  --  24 25  --   --  26  GLUCOSE 239*   < > 168* 153*   < > 149*  --   --   --  201*  --  204* 143*  --   --  97  BUN 71*   < > 72* 73*   < > 95*  --   --   --  93*  --  97* 89*  --   --  97*  CREATININE 1.85*   < > 1.89* 1.90*   < > 3.07*  --   --   --  3.14*  --  3.07* 2.88*  --   --  2.94*  CALCIUM  8.9   < > 9.4 9.1   < > 9.1  --   --   --  8.7*  --  8.8* 8.2*  --   --  8.8*  MG 2.3  --  2.6* 2.7*  --  3.6*  --   --   --   --   --  3.4*  --   --   --  3.1*  PHOS 3.0  --   --   --   --  3.8  --   --   --   --   --   --   --   --   --  4.7*   < > = values in this interval not displayed.   GFR: Estimated Creatinine Clearance: 13.7 mL/min (A) (by C-G formula based on SCr of 2.94 mg/dL (H)). Recent Labs  Lab 03/28/24 0526 03/29/24 0407 03/30/24 0452 03/31/24 0424  WBC 8.4 8.3 7.7 6.2    Liver Function Tests: Recent Labs  Lab 03/28/24 0526  AST 21  ALT 16  ALKPHOS 88  BILITOT 2.6*  PROT 6.3*  ALBUMIN  3.4*   No results for input(s): LIPASE, AMYLASE in the last 168 hours. No results for input(s): AMMONIA in the last 168 hours.  ABG    Component Value Date/Time   PHART 7.505 (H) 03/29/2024 1302   PCO2ART 32.4  03/29/2024 1302   PO2ART 65 (L) 03/29/2024 1302   HCO3 29.4 (H) 03/29/2024 1303   TCO2 31 03/29/2024 1303   ACIDBASEDEF 8.7 (H) 04/11/2021 0210   O2SAT 63.9 03/31/2024 0425     Coagulation Profile: Recent Labs  Lab 03/30/24 1130  INR 1.5*    Cardiac Enzymes: No results for input(s): CKTOTAL, CKMB, CKMBINDEX, TROPONINI in the last 168 hours.  HbA1C: Hemoglobin A1C  Date/Time Value Ref Range Status  06/28/2016 12:00 AM 6.6  Final   HB A1C (BAYER DCA - WAIVED)  Date/Time Value Ref Range Status  12/19/2023 04:24  PM 7.4 (H) 4.8 - 5.6 % Final    Comment:             Prediabetes: 5.7 - 6.4          Diabetes: >6.4          Glycemic control for adults with diabetes: <7.0   09/02/2023 01:35 PM 6.3 (H) 4.8 - 5.6 % Final    Comment:             Prediabetes: 5.7 - 6.4          Diabetes: >6.4          Glycemic control for adults with diabetes: <7.0    Hgb A1c MFr Bld  Date/Time Value Ref Range Status  03/24/2024 04:27 AM 7.6 (H) 4.8 - 5.6 % Final    Comment:    (NOTE) Diagnosis of Diabetes The following HbA1c ranges recommended by the American Diabetes Association (ADA) may be used as an aid in the diagnosis of diabetes mellitus.  Hemoglobin             Suggested A1C NGSP%              Diagnosis  <5.7                   Non Diabetic  5.7-6.4                Pre-Diabetic  >6.4                   Diabetic  <7.0                   Glycemic control for                       adults with diabetes.      CBG: Recent Labs  Lab 03/30/24 0736 03/30/24 1118 03/30/24 1620 03/30/24 2136 03/31/24 0743  GLUCAP 155* 169* 153* 120* 108*    Review of Systems: Positives in BOLD   Gen: Denies fever, chills, weight change, but reports generalized fatigue HEENT: Denies blurred vision, double vision, hearing loss, tinnitus, sinus congestion, rhinorrhea, sore throat, neck stiffness, dysphagia PULM: Mild shortness of breath with exertion and at rest, cough, sputum production, hemoptysis, wheezing CV: Denies chest pain, edema, orthopnea, paroxysmal nocturnal dyspnea, palpitations GI: Denies abdominal pain, nausea, vomiting, diarrhea, hematochezia, melena, but reports constipation and gaseous distention of her abdomen, also reports of farting GU: Denies dysuria, hematuria, polyuria, oliguria, urethral discharge Endocrine: Denies hot or cold intolerance, polyuria, polyphagia or appetite change Derm: Denies rash, dry skin, scaling or peeling skin change Heme: Denies easy bruising, bleeding, bleeding  gums Neuro: Denies headache, numbness, weakness, slurred speech, loss of memory or consciousness  Past Medical History:  She,  has a past medical history of A-fib (HCC), CHF (congestive heart failure) (HCC), Chronic heart failure with  preserved ejection fraction (HFpEF) (HCC), 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   Surgical History:   Past Surgical History:  Procedure Laterality Date   BASAL CELL CARCINOMA EXCISION  11/01/2022   CATARACT EXTRACTION W/PHACO Left 10/09/2021   Procedure: CATARACT EXTRACTION PHACO AND INTRAOCULAR LENS PLACEMENT (IOC) LEFT DIABETIC 4.84 00:37.8;  Surgeon: Clair Crews, MD;  Location: [facility];  Service: Ophthalmology;  Laterality: Left;  Diabetic   CATARACT EXTRACTION W/PHACO Right 10/30/2021   Procedure: CATARACT EXTRACTION PHACO AND INTRAOCULAR LENS PLACEMENT (IOC) RIGHT DIABETIC 5.65 00:38.5;  Surgeon: Clair Crews, MD;  Location: [facility];  Service: Ophthalmology;  Laterality: Right;  Diabetic   CHOLECYSTECTOMY     DILATION AND CURETTAGE OF UTERUS     IR FLUORO GUIDE CV LINE RIGHT  08/16/2021   IR THORACENTESIS ASP PLEURAL SPACE W/IMG GUIDE  08/16/2021   PERICARDIOCENTESIS N/A 08/05/2021   Procedure: PERICARDIOCENTESIS;  Surgeon: Percival Brace, MD;  Location: [facility];  Service: Cardiovascular;  Laterality: N/A;   RIGHT AND LEFT HEART CATH N/A 03/29/2024   Procedure: RIGHT AND LEFT HEART CATH;  Surgeon: Lauralee Poll, MD;  Location: [facility];  Service: Cardiovascular;  Laterality: N/A;   RIGHT HEART CATH N/A 03/22/2024   Procedure: RIGHT  HEART CATH;  Surgeon: Wenona Hamilton, MD;  Location: [facility];  Service: Cardiovascular;  Laterality: N/A;   TEAR DUCT PROBING WITH STRABISMUS REPAIR Right    TONSILLECTOMY       Social History:   reports that she has never smoked. She has never used smokeless tobacco. She reports that she does not currently use alcohol. She reports that she does not use drugs.   Family History:  Her family history includes Breast cancer in her maternal grandmother and mother; Cancer in her brother and brother; Diabetes in her brother; Heart disease in her brother and mother; Heart disease (age of onset: 66) in her father; Osteoporosis in her mother; Parkinson's disease in her brother.   Allergies No Known Allergies   Home Medications  Prior to Admission medications   Medication Sig Start Date End Date Taking? Authorizing Provider  allopurinol  (ZYLOPRIM ) 100 MG tablet Take 1 tablet (100 mg total) by mouth daily. 12/19/23  Yes Johnson, [name], DO  apixaban  (ELIQUIS ) 5 MG TABS tablet Take 1 tablet (5 mg total) by mouth 2 (two) times daily. 12/19/23  Yes Johnson, [name], DO  empagliflozin  (JARDIANCE ) 25 MG TABS tablet TAKE 1 TABLET BY MOUTH ONCE DAILY BEFOREBREAKFAST 12/19/23  Yes Johnson, [name], DO  folic acid  (FOLVITE ) 1 MG tablet Take 1 tablet (1 mg total) by mouth daily. 09/30/23  Yes Johnson, [name], DO  insulin  glargine, 2 Unit Dial, (TOUJEO  MAX SOLOSTAR) 300 UNIT/ML Solostar Pen Inject 16-20 Units into the skin daily. 12/19/23 09/14/24 Yes Johnson, [name], DO  latanoprost  (XALATAN ) 0.005 % ophthalmic solution Place 1 drop into both eyes at bedtime.   Yes [provider]  metolazone  (ZAROXOLYN ) 2.5 MG tablet Take 2.5 mg by mouth once a week. Wednesday   Yes [provider]  metoprolol  succinate (TOPROL -XL) 25 MG 24 hr tablet Take 0.5 tablets (12.5 mg total) by mouth daily. 12/19/23  Yes Johnson, [name], DO  midodrine  (PROAMATINE ) 10 MG tablet TAKE 1 TABLET BY MOUTH 3  TIMES DAILY WITH MEALS 02/24/23  Yes Gollan, Timothy J, MD  pantoprazole  (PROTONIX ) 40 MG tablet Take 1 tablet (40 mg total) by mouth 2 (two) times daily. 12/19/23  Yes Johnson, [name], DO  potassium chloride  SA (KLOR-CON  M) 20 MEQ tablet Take 2 tablets (40 mEq total) by mouth 2 (two) times daily. 12/19/23  Yes Johnson, [name], DO  rosuvastatin  (CRESTOR ) 5 MG tablet Take 1 tablet (5 mg total) by mouth once a week. 05/30/23  Yes Johnson, [name], DO  Semaglutide  (RYBELSUS ) 7 MG TABS Take 1 tablet (7 mg total) by mouth daily. 12/19/23  Yes Johnson, [name], DO  spironolactone  (ALDACTONE ) 50 MG tablet Take 1 tablet (50 mg total) by mouth daily. 12/19/23  Yes Johnson, [name], DO  torsemide  (DEMADEX ) 20 MG tablet TAKE 1 TO 2 TABLETS BY MOUTH DAILY. ONLY TAKE 2 TABLETS FOR 1 TO 3 DAYS AND IF PERSISTS, CALL WITH BAD SWELLING 12/19/23  Yes Johnson, [name], DO  Continuous Glucose Sensor (DEXCOM G7 SENSOR) MISC by Does not apply route.    [provider]  Glucagon  (GVOKE HYPOPEN  2-PACK) 0.5 MG/0.1ML SOAJ Inject 1 each into the skin daily as needed. Patient not taking: Reported on 03/19/2024 03/14/23   Terre Ferri P, DO  Insulin  Pen Needle (PEN NEEDLES) 32G X 4 MM MISC 1 each by Does not apply route 3 (three) times daily. Patient not taking: Reported on 02/24/2024 01/30/23   Terre Ferri P, DO  Insulin  Pen Needle (ULTRACARE PEN NEEDLES) 32G X 4 MM MISC USE 1 PEN ONCE DAILY Patient not taking: Reported on 02/24/2024 10/11/22   Terre Ferri P, DO  Insulin  Syringe-Needle U-100 (INSULIN  SYRINGE 1CC/31GX5/16) 31G X 5/16 1 ML MISC 1 each by Does not apply route 3 (three) times daily. Patient not taking: Reported on 02/24/2024 12/13/22   Terre Ferri P, DO  Insulin  Syringe/Needle U-500 (BD INSULIN  SYRINGE U-500) 31G X 0.5 ML MISC 1 each by Does not apply route 3 (three) times daily as needed. Patient not taking: Reported on 02/24/2024 03/14/23   Terre Ferri P, DO  metroNIDAZOLE (METROGEL) 1 % gel Apply to  face every morning. Patient not taking: Reported on 03/19/2024 09/19/22   [provider]  NEEDLE, DISP, 30 G (BD DISP NEEDLES) 30G X 1/2 MISC by Does not apply route. Patient not taking: Reported on 02/24/2024    [provider]     Critical care time: 36 minutes     Janey Meek, AGNP  Pulmonary/Critical Care Pager [phone] (please enter 7 digits) PCCM Consult Pager [phone] (please enter 7 digits)

## 2024-03-31 NOTE — Progress Notes (Signed)
 Physical Therapy Treatment Patient Details Name: Karen Farley MRN: [id number] DOB: 1946/10/07 Today's Date: 03/31/2024   History of Present Illness 78 y.o. female  who presents with SOB and worsening leg edema. Admitted with Acute Decompensated Biventricular Heart Failure/HFpEF, and moderate to severe Aortic Stenosis complicated by Cardiogenic Shock and Cardiorenal Syndrome requiring inotrope's and vasopressors. PMH of diastolic CHF, hypertension, hyperlipidemia, diabetes mellitus, PAD, gout, GI bleeding, CKD-3B, A-fib on Eliquis , chronic hyponatremia, chronic venous insufficiency, lymphedema.    PT Comments  Patient is agreeable to PT session. The patient reports generalized weakness but no dizziness with mobility. Patient able to maintain sitting for around 20 minutes on edge of bed, with arms intermittently propped up on rolling walker. Patient is able to stand with Min A with standing tolerance of around 2 minutes. She feels too weak to step away from the bed at this time. Recommend to continue PT to maximize independence. May need to consider rehabilitation < 3 hours/day after this hospital stay pending progress with independence and mobility tolerance.    If plan is discharge home, recommend the following: A lot of help with walking and/or transfers;A lot of help with bathing/dressing/bathroom;Help with stairs or ramp for entrance;Assist for transportation;Assistance with cooking/housework   Can travel by private vehicle     No  Equipment Recommendations  None recommended by PT    Recommendations for Other Services       Precautions / Restrictions Precautions Precautions: Fall Recall of Precautions/Restrictions: Intact Restrictions Weight Bearing Restrictions Per Provider Order: No     Mobility  Bed Mobility Overal bed mobility: Needs Assistance Bed Mobility: Sidelying to Sit, Sit to Supine   Sidelying to sit: Mod assist   Sit to supine: Mod assist   General bed  mobility comments: assistance for trunk and BLE support. cues for technique    Transfers Overall transfer level: Needs assistance Equipment used: Rolling walker (2 wheels) Transfers: Sit to/from Stand Sit to Stand: Min assist           General transfer comment: lifting assistance required for standing    Ambulation/Gait               General Gait Details: not attempted due to generalized weakness. standing tolerance of around 2 minutes before needing to sit   Stairs             Wheelchair Mobility     Tilt Bed    Modified Rankin (Stroke Patients Only)       Balance Overall balance assessment: Needs assistance Sitting-balance support: Feet supported Sitting balance-Leahy Scale: Fair Sitting balance - Comments: patient sat for around 20 mintues total, intermittently with arms propped on rolling walker for support. no loss of balance.   Standing balance support: During functional activity, Bilateral upper extremity supported, Reliant on assistive device for balance Standing balance-Leahy Scale: Poor Standing balance comment: heavy relaince on rolling walker for support in standing                            Communication Communication Communication: No apparent difficulties  Cognition Arousal: Alert Behavior During Therapy: WFL for tasks assessed/performed   PT - Cognitive impairments: No apparent impairments                         Following commands: Intact      Cueing Cueing Techniques: Verbal cues  Exercises      General Comments  General comments (skin integrity, edema, etc.): no dizziness reported with activity. vitals monitored throughout session. care plan updated and extended x 2 weeks      Pertinent Vitals/Pain Pain Assessment Pain Assessment: No/denies pain    Home Living                          Prior Function            PT Goals (current goals can now be found in the care plan section)  Acute Rehab PT Goals Patient Stated Goal: to go home PT Goal Formulation: With patient Time For Goal Achievement: 04/14/24 Potential to Achieve Goals: Fair Progress towards PT goals: Progressing toward goals    Frequency    Min 2X/week      PT Plan      Co-evaluation              AM-PAC PT 6 Clicks Mobility   Outcome Measure  Help needed turning from your back to your side while in a flat bed without using bedrails?: A Lot Help needed moving from lying on your back to sitting on the side of a flat bed without using bedrails?: A Lot Help needed moving to and from a bed to a chair (including a wheelchair)?: A Lot Help needed standing up from a chair using your arms (e.g., wheelchair or bedside chair)?: A Lot Help needed to walk in hospital room?: A Lot Help needed climbing 3-5 steps with a railing? : A Lot 6 Click Score: 12    End of Session Equipment Utilized During Treatment: Oxygen Activity Tolerance: Patient tolerated treatment well;Patient limited by fatigue Patient left: in bed;with call bell/phone within reach;with family/visitor present (spouse and brother present) Nurse Communication: Mobility status PT Visit Diagnosis: Other abnormalities of gait and mobility (R26.89);Unsteadiness on feet (R26.81);Muscle weakness (generalized) (M62.81)     Time: 1010-1045 PT Time Calculation (min) (ACUTE ONLY): 35 min  Charges:    $Therapeutic Activity: 23-37 mins PT General Charges $$ ACUTE PT VISIT: 1 Visit                     Karen Farley, PT, MPT    Karen Farley 03/31/2024, 10:53 AM

## 2024-03-31 NOTE — Progress Notes (Signed)
 Renal function reviewed. Given significant increase in sodium and chloride, outside of what would be expected, as well as decreased creatinine, calcium , and potassium suspect that labs were drawn while hypertonic saline was infusing. Repeat ordered.

## 2024-03-31 NOTE — Progress Notes (Signed)
 Advanced Heart Failure Rounding Note   Subjective:     Discussion with patient this morning. She has not gotten out of bed in two days, and despite improvement in her labs still feels incredibly poor with no appetite. She shared that she would not want resuscitation but her husband is still very concerned about her. I reiterated that she is not a surgical candidate and I do not expect this to improve given her debility.   Her renal indicies look significantly better today, still urinating despite last lasix  dose yesterday afternoon. Would hold loop diuretics given hypokalemia but will give an additional dose of HTS this morning. Repeat labs this afternoon. Her filling pressures have significantly improved. However, this appears to have no bearing on her symptomatically. Will continue to have gentle goals of care conversations.    Objective:   Weight Range:  Vital Signs:   Temp:  [97.6 F (36.4 C)-98 F (36.7 C)] 97.7 F (36.5 C) (06/11 0400) Pulse Rate:  [40-106] 81 (06/11 1000) Resp:  [9-27] 24 (06/11 1000) BP: (86-121)/(52-82) 99/67 (06/11 1000) SpO2:  [50 %-100 %] 100 % (06/11 1000) Weight:  [69 kg] 69 kg (06/11 0500) Last BM Date : 03/28/24  Weight change: Filed Weights   03/29/24 0500 03/29/24 0945 03/31/24 0500  Weight: 68.8 kg 68.8 kg 69 kg    Intake/Output:   Intake/Output Summary (Last 24 hours) at 03/31/2024 1110 Last data filed at 03/31/2024 1610 Gross per 24 hour  Intake 455.63 ml  Output 3450 ml  Net -2994.37 ml     Physical Exam: General:  Elderly chronically ill appearing, frail Cor: Irregular, tachycardic, JVP mildly elevated, improved, 2/6 AS Lungs: clear Abdomen: mild distension Extremities: no cyanosis, clubbing, rash, tr edema Neuro: alert & orientedx3, cranial nerves grossly intact. moves all 4 extremities w/o difficulty. Affect pleasant  Telemetry: AF 110s-120s  Labs: Basic Metabolic Panel: Recent Labs  Lab 03/25/24 0532 03/25/24 2232  03/26/24 0509 03/27/24 0402 03/28/24 9604 03/29/24 0407 03/29/24 0902 03/29/24 1303 03/29/24 1441 03/29/24 1754 03/30/24 0303 03/30/24 5409 03/30/24 1536 03/30/24 2024 03/31/24 0233 03/31/24 0424 03/31/24 0910  NA 127*   < > 128* 125*   < > 118*   < > 119*   < > 118*   < > 119* 124* 123* 124* 123* 123*  K 3.5   < > 4.0 3.1*   < > 4.8   < > 4.5  --  4.9  --  4.6 3.9  --   --  2.9*  --   CL 88*   < > 87* 82*   < > 78*  --   --   --  79*  --  80* 85*  --   --  82*  --   CO2 27   < > 30 26   < > 26  --   --   --  24  --  24 25  --   --  26  --   GLUCOSE 239*   < > 168* 153*   < > 149*  --   --   --  201*  --  204* 143*  --   --  97  --   BUN 71*   < > 72* 73*   < > 95*  --   --   --  93*  --  97* 89*  --   --  97*  --   CREATININE 1.85*   < > 1.89*  1.90*   < > 3.07*  --   --   --  3.14*  --  3.07* 2.88*  --   --  2.94*  --   CALCIUM  8.9   < > 9.4 9.1   < > 9.1  --   --   --  8.7*  --  8.8* 8.2*  --   --  8.8*  --   MG 2.3  --  2.6* 2.7*  --  3.6*  --   --   --   --   --  3.4*  --   --   --  3.1*  --   PHOS 3.0  --   --   --   --  3.8  --   --   --   --   --   --   --   --   --  4.7*  --    < > = values in this interval not displayed.    Liver Function Tests: Recent Labs  Lab 03/28/24 0526  AST 21  ALT 16  ALKPHOS 88  BILITOT 2.6*  PROT 6.3*  ALBUMIN  3.4*   No results for input(s): LIPASE, AMYLASE in the last 168 hours. No results for input(s): AMMONIA in the last 168 hours.  CBC: Recent Labs  Lab 03/27/24 0402 03/28/24 0526 03/29/24 0407 03/29/24 1257 03/29/24 1302 03/29/24 1303 03/30/24 0452 03/31/24 0424  WBC 9.4 8.4 8.3  --   --   --  7.7 6.2  HGB 11.5* 11.9* 11.6* 14.3 13.9 14.3 11.6* 11.5*  HCT 37.3 38.5 36.8 42.0 41.0 42.0 37.0 36.9  MCV 69.2* 70.4* 68.9*  --   --   --  70.7* 70.6*  PLT 309 341 358  --   --   --  362 283    Cardiac Enzymes: No results for input(s): CKTOTAL, CKMB, CKMBINDEX, TROPONINI in the last 168 hours.  BNP: BNP  (last 3 results) Recent Labs    12/19/23 1624 03/19/24 1038  BNP 132.4* 265.4*    ProBNP (last 3 results) No results for input(s): PROBNP in the last 8760 hours.    Medications:     Scheduled Medications:  allopurinol   100 mg Oral Daily   apixaban   5 mg Oral BID   Chlorhexidine  Gluconate Cloth  6 each Topical Daily   docusate sodium   100 mg Oral BID   feeding supplement  237 mL Oral BID BM   folic acid   1 mg Oral Daily   insulin  aspart  0-15 Units Subcutaneous TID WC   insulin  aspart  0-5 Units Subcutaneous QHS   insulin  aspart  3 Units Subcutaneous TID WC   insulin  glargine-yfgn  10 Units Subcutaneous BID   latanoprost   1 drop Both Eyes QHS   pantoprazole   40 mg Oral BID   polyethylene glycol  17 g Oral Daily   rosuvastatin   5 mg Oral Weekly   senna  1 tablet Oral Daily   sodium chloride  flush  10-40 mL Intracatheter Q12H    Infusions:  norepinephrine  (LEVOPHED ) Adult infusion Stopped (03/30/24 2595)   potassium chloride      sodium chloride  3% (hypertonic)     vasopressin 0.03 Units/min (03/31/24 0601)    PRN Medications: acetaminophen , albuterol , bisacodyl , dextromethorphan-guaiFENesin , ondansetron  (ZOFRAN ) IV, mouth rinse, oxyCODONE , sodium chloride  flush, traMADol , traZODone    Assessment/Plan:   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 - EF 60-65% with mod-severe AS normal RV on echo read, upon further  review there is evidence of septal interdependence, bright pericardium  - RHC 03/22/24: RA 18, PA 38/22 (30), PCW 26, CO/CI 3.06/1.85, PAPi 0.89 - Remains on IV NE and vasopressin with improvement in urine output and sodium/chlodrine - 1x dose of 150mg  hypertonic saline this morning, reassess need for loop this afternoon - No indication for CRRT, do not believe it will meaningfully affect her long term prognosis - Patient appears to be more inclined to pursue comfort care, but her husband is more reluctant  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 Up to 2.8 on admission -Indicies of diuretic resistance improving, hyponatremia now to 123 this morning, HTS as above  - nephrology following, appreciate recs - Considering CRRT, unlikely to be beneficial long term  Moderate to severe AS/moderate MR - stable    Small pericardial Effusion - h/o pericardial effusion s/p pericardiocentesis 700 ml in 07/2021. Bloody, neg for malignancy. - small w/o tamponade on echo - MRI ok   Atrial Fibrillation: Permanent - hold ? 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  Critical care was necessary to treat or prevent imminent or life-threatening deterioration.  Critical care was time spent personally by me on the following activities: development of treatment plan with patient and/or surrogate as well as nursing, discussions with consultants, evaluation of patient's response to treatment, examination of patient, obtaining history from patient or surrogate, ordering and performing treatments and interventions, ordering and review of laboratory studies, ordering and review of radiographic studies, pulse oximetry and re-evaluation of patient's condition.    Length of Stay: 12   Lauralee Poll MD 03/31/2024, 11:10 AM  Advanced Heart Failure Team Pager [phone] (M-F; 7a - 4p)   Please contact [facility] for night-coverage after hours (4p -7a ) and weekends on [email or web address]

## 2024-03-31 NOTE — Progress Notes (Addendum)
 [facility] [place], [place] 03/31/24  Subjective:   Hospital day # 12   Patient known to our practice from outpatient follow-up of CKD.  She presented to the emergency room with worsening shortness of breath with exertion.  Normally she is able to drive and carry on activities of daily living with ease.  For the last 2 to 3 weeks and especially last 4 days she has been getting short of breath and having to rest even to go from 1 room to the next.  She has developed worsening lower extremity edema.  She is now presented to the hospital for further management.  Update Ill appearing, somnolent Arousable for brief moments Remains on room air Trace peripheral edema  Vaso and Heparin  drip present Received total of IV Furosemide  480mg  and Metolazone  10mg  yesterday.   Renal: 06/10 0701 - 06/11 0700 In: 455.6 [I.V.:382.9; IV Piggyback:72.8] Out: 3450 [Urine:3450] Lab Results  Component Value Date   CREATININE 2.94 (H) 03/31/2024   CREATININE 2.88 (H) 03/30/2024   CREATININE 3.07 (H) 03/30/2024     Objective:  Vital signs in last 24 hours:  Temp:  [97.6 F (36.4 C)-98 F (36.7 C)] 97.7 F (36.5 C) (06/11 0400) Pulse Rate:  [40-98] 73 (06/11 0700) Resp:  [9-27] 22 (06/11 0700) BP: (86-121)/(54-82) 100/69 (06/11 0700) SpO2:  [50 %-100 %] 99 % (06/11 0700) Weight:  [69 kg] 69 kg (06/11 0500)  Weight change: 0.2 kg Filed Weights   03/29/24 0500 03/29/24 0945 03/31/24 0500  Weight: 68.8 kg 68.8 kg 69 kg    Intake/Output:    Intake/Output Summary (Last 24 hours) at 03/31/2024 1001 Last data filed at 03/31/2024 1610 Gross per 24 hour  Intake 455.63 ml  Output 3450 ml  Net -2994.37 ml     Physical Exam: General: Ill-appearing  HEENT Moist mucous membranes  Pulm/lungs Normal breathing effort on room air  CVS/Heart Irregular rhythm  Abdomen:  Mildly distended  Extremities: trace pitting edema and lymphedema  Neurologic: Arousable for short periods   Skin: No acute rashes          Basic Metabolic Panel:  Recent Labs  Lab 03/25/24 0532 03/25/24 2232 03/26/24 0509 03/27/24 0402 03/28/24 0526 03/29/24 0407 03/29/24 0902 03/29/24 1303 03/29/24 1441 03/29/24 1754 03/30/24 0303 03/30/24 0452 03/30/24 1536 03/30/24 2024 03/31/24 0233 03/31/24 0424 03/31/24 0910  NA 127*   < > 128* 125*   < > 118*   < > 119*   < > 118*   < > 119* 124* 123* 124* 123* 123*  K 3.5   < > 4.0 3.1*   < > 4.8   < > 4.5  --  4.9  --  4.6 3.9  --   --  2.9*  --   CL 88*   < > 87* 82*   < > 78*  --   --   --  79*  --  80* 85*  --   --  82*  --   CO2 27   < > 30 26   < > 26  --   --   --  24  --  24 25  --   --  26  --   GLUCOSE 239*   < > 168* 153*   < > 149*  --   --   --  201*  --  204* 143*  --   --  97  --   BUN 71*   < >  72* 73*   < > 95*  --   --   --  93*  --  97* 89*  --   --  97*  --   CREATININE 1.85*   < > 1.89* 1.90*   < > 3.07*  --   --   --  3.14*  --  3.07* 2.88*  --   --  2.94*  --   CALCIUM  8.9   < > 9.4 9.1   < > 9.1  --   --   --  8.7*  --  8.8* 8.2*  --   --  8.8*  --   MG 2.3  --  2.6* 2.7*  --  3.6*  --   --   --   --   --  3.4*  --   --   --  3.1*  --   PHOS 3.0  --   --   --   --  3.8  --   --   --   --   --   --   --   --   --  4.7*  --    < > = values in this interval not displayed.     CBC: Recent Labs  Lab 03/27/24 0402 03/28/24 0526 03/29/24 0407 03/29/24 1257 03/29/24 1302 03/29/24 1303 03/30/24 0452 03/31/24 0424  WBC 9.4 8.4 8.3  --   --   --  7.7 6.2  HGB 11.5* 11.9* 11.6* 14.3 13.9 14.3 11.6* 11.5*  HCT 37.3 38.5 36.8 42.0 41.0 42.0 37.0 36.9  MCV 69.2* 70.4* 68.9*  --   --   --  70.7* 70.6*  PLT 309 341 358  --   --   --  362 283      Lab Results  Component Value Date   HEPBSAG NON REACTIVE 03/21/2024   HEPBSAB NON REACTIVE 04/11/2021   HEPBIGM NON REACTIVE 03/21/2024      Microbiology:  Recent Results (from the past 240 hours)  MRSA Next Gen by PCR, Nasal     Status: None   Collection  Time: 03/22/24 12:53 PM   Specimen: Nasal Mucosa; Nasal Swab  Result Value Ref Range Status   MRSA by PCR Next Gen NOT DETECTED NOT DETECTED Final    Comment: (NOTE) The GeneXpert MRSA Assay (FDA approved for NASAL specimens only), is one component of a comprehensive MRSA colonization surveillance program. It is not intended to diagnose MRSA infection nor to guide or monitor treatment for MRSA infections. Test performance is not FDA approved in patients less than 14 years old. Performed at [facility], [address] [place], [place] [address]     Coagulation Studies: Recent Labs    03/30/24 1130  LABPROT 18.0*  INR 1.5*    Urinalysis: No results for input(s): COLORURINE, LABSPEC, PHURINE, GLUCOSEU, HGBUR, BILIRUBINUR, KETONESUR, PROTEINUR, UROBILINOGEN, NITRITE, LEUKOCYTESUR in the last 72 hours.  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 Will attempt to optimize prior to CT surgery consult but suspect her prognosis is poor       Medications:    heparin  800 Units/hr (03/31/24 0859)   norepinephrine  (LEVOPHED ) Adult infusion Stopped (03/30/24 4132)   potassium chloride      sodium chloride  3% (hypertonic)     vasopressin 0.03 Units/min (03/31/24 0601)    allopurinol    100 mg Oral Daily   Chlorhexidine  Gluconate Cloth  6 each Topical Daily   docusate sodium   100 mg Oral BID   feeding supplement  237 mL Oral BID BM   folic acid   1 mg Oral Daily   insulin  aspart  0-15 Units Subcutaneous TID WC   insulin  aspart  0-5 Units Subcutaneous QHS   insulin  aspart  3 Units Subcutaneous TID WC   insulin  glargine-yfgn  10 Units Subcutaneous BID   latanoprost   1 drop Both Eyes QHS   pantoprazole   40 mg Oral BID   polyethylene glycol  17 g Oral Daily   rosuvastatin   5 mg Oral Weekly   senna  1 tablet Oral Daily   sodium chloride  flush  10-40 mL Intracatheter Q12H   acetaminophen , albuterol , bisacodyl , dextromethorphan-guaiFENesin , ondansetron  (ZOFRAN ) IV, mouth rinse, oxyCODONE , sodium chloride  flush, traMADol , traZODone   Assessment/ Plan:  78 y.o. female with  multiple medical problems including chronic kidney disease, hypertension, atrial fibrillation, hyperlipidemia, chronic lower extremity edema, diastolic dysfunction, diabetes, osteoarthritis of the knee, chronic back pain  admitted on 03/19/2024 for Shortness of breath [R06.02] Acute on chronic diastolic heart failure (HCC) [I50.33] Acute on chronic diastolic CHF (congestive heart failure) (HCC) [I50.33]  1.  Acute on chronic diastolic CHF, with worsening lower extremity edema and shortness of breath 2.  Acute kidney injury on chronic kidney disease stage IV.  Baseline creatinine 2.6/GFR 19 from April 2025. 3.  Hypokalemia 4.  Hyponatremia. 5.  Diabetes type 2 with CKD.  Hemoglobin A1c 7.4% from February 2025. 6.  Constipation  2D echo 03/20/2024-LVEF 60 to 65%, moderate concentric LVH, normal right ventricular systolic function small pericardial effusion, normal aortic valve, severe mitral annular calcification  Plan: RHC on 6/2 showed severely decreased cardiac output, biventricular failure with RV dysfunction. Cardiology feels this is end stage diastolic heart failure. Milrinone  drip with IV Furosemide .   Repeat RHC with [facility] which showed moderately elevated biventricular filling pressures, constrictive disease and moderate pulmonary hypertension.                                                                     May consider CRRT, if needed. Patient received a total of 480mg  of IV furosemide  and Metolazone  10mg  yesterday. UOP 3.45L in previous 24 hours. Creatinine remains at baseline. Potassium corrected  Sodium has improved to 123 with diuresis.Aaron Aas  Has received tolvaptan  during this admission. Continue Ensure supplementation twice daily Primary team to continue management of SSI  Continue bowel regimen.   LOS: 12 [facility] 6/11/202510:01 AM  [facility] [address] [place], [place] [phone]

## 2024-04-01 ENCOUNTER — Other Ambulatory Visit: Payer: Self-pay

## 2024-04-01 DIAGNOSIS — I5033 Acute on chronic diastolic (congestive) heart failure: Secondary | ICD-10-CM | POA: Diagnosis not present

## 2024-04-01 LAB — BASIC METABOLIC PANEL WITH GFR
Anion gap: 14 (ref 5–15)
Anion gap: 14 (ref 5–15)
BUN: 97 mg/dL — ABNORMAL HIGH (ref 8–23)
BUN: 95 mg/dL — ABNORMAL HIGH (ref 8–23)
CO2: 28 mmol/L (ref 22–32)
CO2: 26 mmol/L (ref 22–32)
Calcium: 8.9 mg/dL (ref 8.9–10.3)
Calcium: 9 mg/dL (ref 8.9–10.3)
Chloride: 83 mmol/L — ABNORMAL LOW (ref 98–111)
Chloride: 83 mmol/L — ABNORMAL LOW (ref 98–111)
Creatinine, Ser: 2.49 mg/dL — ABNORMAL HIGH (ref 0.44–1.00)
Creatinine, Ser: 2.43 mg/dL — ABNORMAL HIGH (ref 0.44–1.00)
GFR, Estimated: 19 mL/min — ABNORMAL LOW (ref >60)
GFR, Estimated: 20 mL/min — ABNORMAL LOW (ref >60)
Glucose, Bld: 84 mg/dL (ref 70–99)
Glucose, Bld: 157 mg/dL — ABNORMAL HIGH (ref 70–99)
Potassium: 3.3 mmol/L — ABNORMAL LOW (ref 3.5–5.1)
Potassium: 5.1 mmol/L (ref 3.5–5.1)
Sodium: 125 mmol/L — ABNORMAL LOW (ref 135–145)
Sodium: 123 mmol/L — ABNORMAL LOW (ref 135–145)

## 2024-04-01 LAB — CBC
HCT: 37.5 % (ref 36.0–46.0)
Hemoglobin: 11.6 g/dL — ABNORMAL LOW (ref 12.0–15.0)
MCH: 22.3 pg — ABNORMAL LOW (ref 26.0–34.0)
MCHC: 30.9 g/dL (ref 30.0–36.0)
MCV: 72.1 fL — ABNORMAL LOW (ref 80.0–100.0)
Platelets: 268 10*3/uL (ref 150–400)
RBC: 5.2 MIL/uL — ABNORMAL HIGH (ref 3.87–5.11)
RDW: 22.7 % — ABNORMAL HIGH (ref 11.5–15.5)
WBC: 5.1 10*3/uL (ref 4.0–10.5)
nRBC: 0 % (ref 0.0–0.2)

## 2024-04-01 LAB — COOXEMETRY PANEL
Carboxyhemoglobin: 1.4 % (ref 0.5–1.5)
Methemoglobin: <0.7 % (ref 0.0–1.5)
O2 Saturation: 57.4 %
Total hemoglobin: 12.3 g/dL (ref 12.0–16.0)
Total oxygen content: 56.4 %

## 2024-04-01 LAB — SODIUM: Sodium: 126 mmol/L — ABNORMAL LOW (ref 135–145)

## 2024-04-01 LAB — MAGNESIUM: Magnesium: 2.5 mg/dL — ABNORMAL HIGH (ref 1.7–2.4)

## 2024-04-01 LAB — GLUCOSE, CAPILLARY
Glucose-Capillary: 91 mg/dL (ref 70–99)
Glucose-Capillary: 93 mg/dL (ref 70–99)
Glucose-Capillary: 144 mg/dL — ABNORMAL HIGH (ref 70–99)
Glucose-Capillary: 151 mg/dL — ABNORMAL HIGH (ref 70–99)
Glucose-Capillary: 225 mg/dL — ABNORMAL HIGH (ref 70–99)

## 2024-04-01 LAB — PHOSPHORUS: Phosphorus: 4.4 mg/dL (ref 2.5–4.6)

## 2024-04-01 MED ORDER — POTASSIUM CHLORIDE 20 MEQ PO PACK
40.0000 meq | PACK | ORAL | Status: AC
  Administered 2024-04-01 (×2): 40 meq via ORAL
  Filled 2024-04-01 (×2): qty 2

## 2024-04-01 NOTE — Progress Notes (Signed)
 Physical Therapy Treatment Patient Details Name: Karen Farley MRN: [id number] DOB: 06-18-1946 Today's Date: 04/01/2024   History of Present Illness 78 y.o. female  who presents with SOB and worsening leg edema. Admitted with Acute Decompensated Biventricular Heart Failure/HFpEF, and moderate to severe Aortic Stenosis complicated by Cardiogenic Shock and Cardiorenal Syndrome requiring inotrope's and vasopressors. PMH of diastolic CHF, hypertension, hyperlipidemia, diabetes mellitus, PAD, gout, GI bleeding, CKD-3B, A-fib on Eliquis , chronic hyponatremia, chronic venous insufficiency, lymphedema.    PT Comments  Patient supine in bed upon arrival, agreeable to PT/OT co-tx. Patient motivated throughout session, despite reports of fatigue. Patient able to complete supine > sit with MIN A +2, patient able to sit EOB to participate in ADL. Once completed able to stand with MIN A +2 from EOB, on second attempt able to complete with CGA. Pt able to take some lateral side steps alongside EOB with MIN A +2, increased time required. Vitals stable throughout session. Patient require MOD A +2 to return to bed, positioned in sidelying for position/offloading. Will continue to follow acutely     If plan is discharge home, recommend the following: A lot of help with walking and/or transfers;A lot of help with bathing/dressing/bathroom;Help with stairs or ramp for entrance;Assist for transportation;Assistance with cooking/housework   Can travel by private vehicle     No  Equipment Recommendations  None recommended by PT    Recommendations for Other Services       Precautions / Restrictions Precautions Precautions: Fall Recall of Precautions/Restrictions: Intact Restrictions Weight Bearing Restrictions Per Provider Order: No     Mobility  Bed Mobility Overal bed mobility: Needs Assistance Bed Mobility: Supine to Sit, Sit to Supine     Supine to sit: Min assist, +2 for physical assistance, +2  for safety/equipment, Used rails, HOB elevated Sit to supine: Mod assist, +2 for physical assistance, +2 for safety/equipment, HOB elevated, Used rails   General bed mobility comments: Patient require increased time for completion, cues for technique. assist for scooting forward to EOB. Mod A for returning supine in bed, assist with BLE and trunk. Patient able to sit EOB, approx 15 minutes    Transfers Overall transfer level: Needs assistance Equipment used: Rolling walker (2 wheels) Transfers: Sit to/from Stand Sit to Stand: Min assist, +2 physical assistance, Contact guard assist           General transfer comment: Patient require MIN A +2 to stand from EOB with poor posture requiring verbal cues for upright posture. On second attempt to stand patient only requiring CGA. Patient able to stand at least 2-3 minutes with UE support from RW.    Ambulation/Gait               General Gait Details: not attempted due to fatigue; patient able to laterally step alongside bed to [facility] with RW and CGA.   Stairs             Wheelchair Mobility     Tilt Bed    Modified Rankin (Stroke Patients Only)       Balance Overall balance assessment: Needs assistance Sitting-balance support: Feet supported Sitting balance-Leahy Scale: Fair     Standing balance support: During functional activity, Bilateral upper extremity supported, Reliant on assistive device for balance Standing balance-Leahy Scale: Poor                              Communication Communication Communication: No apparent difficulties  Cognition Arousal: Alert Behavior During Therapy: WFL for tasks assessed/performed                             Following commands: Intact      Cueing Cueing Techniques: Verbal cues  Exercises      General Comments General comments (skin integrity, edema, etc.): Vitals monitored throughout session, in therapeutic ranges. BP start of session 105/78  (87), HR 70-80s, sitting EOB 117/91 (100), with standing attempt HR increased to 117.      Pertinent Vitals/Pain Pain Assessment Pain Assessment: Faces Faces Pain Scale: Hurts a little bit Pain Location: generalized - shoulder, back Pain Descriptors / Indicators: Discomfort Pain Intervention(s): Limited activity within patient's tolerance, Monitored during session    Home Living                          Prior Function            PT Goals (current goals can now be found in the care plan section) Acute Rehab PT Goals PT Goal Formulation: With patient Time For Goal Achievement: 04/14/24 Potential to Achieve Goals: Fair Progress towards PT goals: Progressing toward goals    Frequency    Min 2X/week      PT Plan      Co-evaluation   Reason for Co-Treatment: Complexity of the patient's impairments (multi-system involvement);For patient/therapist safety;To address functional/ADL transfers PT goals addressed during session: Mobility/safety with mobility;Balance OT goals addressed during session: ADL's and self-care      AM-PAC PT 6 Clicks Mobility   Outcome Measure  Help needed turning from your back to your side while in a flat bed without using bedrails?: A Lot Help needed moving from lying on your back to sitting on the side of a flat bed without using bedrails?: A Lot Help needed moving to and from a bed to a chair (including a wheelchair)?: A Lot Help needed standing up from a chair using your arms (e.g., wheelchair or bedside chair)?: A Lot Help needed to walk in hospital room?: A Lot Help needed climbing 3-5 steps with a railing? : A Lot 6 Click Score: 12    End of Session   Activity Tolerance: Patient tolerated treatment well;Patient limited by fatigue Patient left: in bed;with call bell/phone within reach;with family/visitor present Nurse Communication: Mobility status PT Visit Diagnosis: Other abnormalities of gait and mobility  (R26.89);Unsteadiness on feet (R26.81);Muscle weakness (generalized) (M62.81)     Time: 4098-1191 PT Time Calculation (min) (ACUTE ONLY): 37 min  Charges:    $Therapeutic Activity: 8-22 mins PT General Charges $$ ACUTE PT VISIT: 1 Visit                     Quillian Brunt Fairly, PT, DPT 04/01/24 3:15 PM

## 2024-04-01 NOTE — Progress Notes (Signed)
 Pt transferred. Pt AxOx4. VSS. All patient belongings kept at bedside sent to new room with pt's husband.

## 2024-04-01 NOTE — TOC Progression Note (Signed)
 Transition of Care [facility]) - Progression Note    Patient Details  Name: Karen Farley MRN: [id number] Date of Birth: 1946-05-18  Transition of Care [facility] CM/SW Contact  Yohan Samons A Joeann Steppe, RN Phone Number: 04/01/2024, 3:40 PM  Clinical Narrative:    Chart reviewed. I have meet with patient and her husband at bedside.  We have discussed Short term rehab, Home with Hospice and home health options.  [name] would like to consider short term rehab. She would like to get somewhat stronger so that she is able to return to her home.  We have also spoken about Hospice and Palliative Care. [name] was agreeable to speaking with [facility] about services that they provide.  I have asked Fredrik Jensen with Hospice to speak with patient and his family.    I have explained the SNF process and gave listing of SNF's in the [place] area.    Will complete SNF workup and fax out to [place] area. Mrs. Johanning was interested in [place] and [place].    TOC will continue to follow for discharge planning.    Expected Discharge Plan: Home w Home Health Services Barriers to Discharge: Continued Medical Work up  Expected Discharge Plan and Services   Discharge Planning Services: CM Consult Post Acute Care Choice: Home Health Living arrangements for the past 2 months: Single Family Home                 DME Arranged:  (walker with wheels, at home.  Patient reports that she has a seat the pulls out in the shower.)         HH Arranged: RN, PT, OT HH Agency: [facility] Date [facility] Agency Contacted: 03/25/24   Representative spoke with at [facility] Agency: [place]    Social Determinants of Health (SDOH) Interventions SDOH Screenings   Food Insecurity: No Food Insecurity (03/19/2024)  Housing: Low Risk  (03/19/2024)  Transportation Needs: No Transportation Needs (03/19/2024)  Utilities: Not At Risk (03/19/2024)  Alcohol Screen: Low Risk  (07/23/2022)  Depression (PHQ2-9): Low Risk   (12/19/2023)  Financial Resource Strain: Low Risk  (07/23/2022)  Physical Activity: Sufficiently Active (07/23/2022)  Social Connections: Moderately Integrated (03/30/2024)  Stress: No Stress Concern Present (07/23/2022)  Tobacco Use: Low Risk  (03/19/2024)    Readmission Risk Interventions     No data to display

## 2024-04-01 NOTE — Plan of Care (Signed)
  Problem: Coping: Goal: Ability to adjust to condition or change in health will improve Outcome: Progressing   Problem: Fluid Volume: Goal: Ability to maintain a balanced intake and output will improve Outcome: Progressing   Problem: Tissue Perfusion: Goal: Adequacy of tissue perfusion will improve Outcome: Progressing   Problem: Education: Goal: Knowledge of General Education information will improve Description: Including pain rating scale, medication(s)/side effects and non-pharmacologic comfort measures Outcome: Progressing   Problem: Coping: Goal: Level of anxiety will decrease Outcome: Progressing   Problem: Elimination: Goal: Will not experience complications related to urinary retention Outcome: Progressing   Problem: Pain Managment: Goal: General experience of comfort will improve and/or be controlled Outcome: Progressing   Problem: Safety: Goal: Ability to remain free from injury will improve Outcome: Progressing   Problem: Cardiovascular: Goal: Ability to achieve and maintain adequate cardiovascular perfusion will improve Outcome: Progressing Goal: Vascular access site(s) Level 0-1 will be maintained Outcome: Progressing   Problem: Nutritional: Goal: Maintenance of adequate nutrition will improve Outcome: Not Progressing   Problem: Activity: Goal: Ability to return to baseline activity level will improve Outcome: Not Progressing

## 2024-04-01 NOTE — Progress Notes (Signed)
 Occupational Therapy Treatment Patient Details Name: Karen Farley MRN: [id number] DOB: Mar 22, 1946 Today's Date: 04/01/2024   History of present illness 78 y.o. female  who presents with SOB and worsening leg edema. Admitted with Acute Decompensated Biventricular Heart Failure/HFpEF, and moderate to severe Aortic Stenosis complicated by Cardiogenic Shock and Cardiorenal Syndrome requiring inotrope's and vasopressors. PMH of diastolic CHF, hypertension, hyperlipidemia, diabetes mellitus, PAD, gout, GI bleeding, CKD-3B, A-fib on Eliquis , chronic hyponatremia, chronic venous insufficiency, lymphedema.   OT comments  Pt seen for OT/PT co-tx this date. Pt pleasant and motivated to participate despite fatigue. Pt requires MIN A +2 for bed mobility to transition from supine > seated EOB, sits EOB with UE support on RW for 20+ mins to participate in grooming tasks, and is able to stand with MIN A +2, assist to rise fully upright and tolerates 2 mins standing with heavy UE reliance on RW. Pt given seated recovery break and was able to stand a second time and take lateral sidesteps with MIN A +2 using RW, slow movements and increased time needed. MOD A +2 to return to supine, pt rolled on to L side to offset weight and protect skin integrity. Vital signs stable and monitored throughout session, RN updated on pt's status and lines/leads intact start + end of session. Discharge recommendations updated. OT will continue to follow for functional gains.       If plan is discharge home, recommend the following:  A lot of help with walking and/or transfers;A lot of help with bathing/dressing/bathroom;Assistance with cooking/housework;Direct supervision/assist for financial management;Assist for transportation;Direct supervision/assist for medications management;Help with stairs or ramp for entrance   Equipment Recommendations  None recommended by OT       Precautions / Restrictions Precautions Precautions:  Fall Recall of Precautions/Restrictions: Intact Restrictions Weight Bearing Restrictions Per Provider Order: No       Mobility Bed Mobility Overal bed mobility: Needs Assistance Bed Mobility: Supine to Sit, Sit to Supine     Supine to sit: Min assist, +2 for physical assistance, +2 for safety/equipment, Used rails, HOB elevated Sit to supine: Mod assist, +2 for physical assistance, +2 for safety/equipment, HOB elevated, Used rails   General bed mobility comments: increased time, assist for trunk and BLE support    Transfers Overall transfer level: Needs assistance Equipment used: Rolling walker (2 wheels) Transfers: Sit to/from Stand Sit to Stand: Min assist, +2 physical assistance, Contact guard assist           General transfer comment: stands 2x with RW, vcs for hand placement. tolerates 2 mins standing. on second standing attempt, pt able to rise with CGA. Takes a few lateral sidesteps towards [facility] with increased time and effort.     Balance Overall balance assessment: Needs assistance Sitting-balance support: Feet supported Sitting balance-Leahy Scale: Fair Sitting balance - Comments: patient sat for around 20 mintues total, intermittently with arms propped on rolling walker for support. no loss of balance.   Standing balance support: During functional activity, Bilateral upper extremity supported, Reliant on assistive device for balance Standing balance-Leahy Scale: Poor Standing balance comment: reliant on UE support on RW                           ADL either performed or assessed with clinical judgement   ADL Overall ADL's : Needs assistance/impaired     Grooming: Wash/dry hands;Wash/dry face;Sitting;Set up Grooming Details (indicate cue type and reason): pt sits EOB for 20+  mins with BUE support on RW. completes grooming tasks with setup                             Functional mobility during ADLs: +2 for physical assistance;+2 for  safety/equipment;Rolling walker (2 wheels);Minimal assistance General ADL Comments: Sat EOB for prolonged period of time for ADL and activity tolerance, 2x standing bouts and able to take lateral sidesteps. Unable to progress to transfer.    Communication Communication Communication: No apparent difficulties   Cognition Arousal: Alert Behavior During Therapy: WFL for tasks assessed/performed Cognition: No apparent impairments                               Following commands: Intact        Cueing   Cueing Techniques: Verbal cues        General Comments Vitals monitored throughout session, in therapeutic ranges. BP start of session 105/78 (87), HR 70-80s, sitting EOB 117/91 (100), with standing attempt HR increased to 117. On RA, SpO2 95%>. HR down to 84 end of session.     Pertinent Vitals/ Pain       Pain Assessment Pain Assessment: Faces Faces Pain Scale: Hurts a little bit Pain Location: generalized - shoulder, back Pain Descriptors / Indicators: Discomfort Pain Intervention(s): Limited activity within patient's tolerance, Monitored during session, Repositioned   Frequency  Min 2X/week        Progress Toward Goals  OT Goals(current goals can now be found in the care plan section)  Progress towards OT goals: Progressing toward goals  Acute Rehab OT Goals OT Goal Formulation: With patient Time For Goal Achievement: 04/08/24 Potential to Achieve Goals: Good  Plan      Co-evaluation    PT/OT/SLP Co-Evaluation/Treatment: Yes Reason for Co-Treatment: Complexity of the patient's impairments (multi-system involvement);For patient/therapist safety;To address functional/ADL transfers PT goals addressed during session: Mobility/safety with mobility;Balance OT goals addressed during session: ADL's and self-care      AM-PAC OT 6 Clicks Daily Activity     Outcome Measure   Help from another person eating meals?: None Help from another person taking  care of personal grooming?: A Little Help from another person toileting, which includes using toliet, bedpan, or urinal?: A Lot Help from another person bathing (including washing, rinsing, drying)?: A Lot Help from another person to put on and taking off regular upper body clothing?: A Lot Help from another person to put on and taking off regular lower body clothing?: A Lot 6 Click Score: 15    End of Session Equipment Utilized During Treatment: Rolling walker (2 wheels)  OT Visit Diagnosis: Other abnormalities of gait and mobility (R26.89)   Activity Tolerance Patient tolerated treatment well   Patient Left in bed;with call bell/phone within reach;with family/visitor present   Nurse Communication Mobility status        Time: 1353-1430 OT Time Calculation (min): 37 min  Charges: OT General Charges $OT Visit: 1 Visit OT Treatments $Self Care/Home Management : 8-22 mins Kiowa Hollar L. Fayelynn Distel, OTR/L  04/01/24, 2:46 PM

## 2024-04-01 NOTE — Progress Notes (Signed)
 [facility] [place], [place] 04/01/24  Subjective:   Hospital day # 13   Patient known to our practice from outpatient follow-up of CKD.  She presented to the emergency room with worsening shortness of breath with exertion.  Normally she is able to drive and carry on activities of daily living with ease.  For the last 2 to 3 weeks and especially last 4 days she has been getting short of breath and having to rest even to go from 1 room to the next.  She has developed worsening lower extremity edema.  She is now presented to the hospital for further management.  Update Continues to appear frail, weak Husband at bedside Was able to tolerate small amount of breakfast   Renal: 06/11 0701 - 06/12 0700 In: 617.4 [P.O.:250; I.V.:87.6; IV Piggyback:279.9] Out: 1825 [Urine:1825] Lab Results  Component Value Date   CREATININE 2.49 (H) 04/01/2024   CREATININE 2.67 (H) 03/31/2024   CREATININE CANCELED BY LAB, QUESTIONABLE SPECIMEN INTEGRITY 03/31/2024     Objective:  Vital signs in last 24 hours:  Temp:  [97.4 F (36.3 C)-97.7 F (36.5 C)] 97.4 F (36.3 C) (06/12 0800) Pulse Rate:  [58-92] 73 (06/12 1100) Resp:  [13-24] 15 (06/12 1100) BP: (84-119)/(50-72) 103/72 (06/12 1100) SpO2:  [92 %-100 %] 95 % (06/12 1100) Weight:  [70.5 kg] 70.5 kg (06/12 0500)  Weight change: 1.5 kg Filed Weights   03/29/24 0945 03/31/24 0500 04/01/24 0500  Weight: 68.8 kg 69 kg 70.5 kg    Intake/Output:    Intake/Output Summary (Last 24 hours) at 04/01/2024 1117 Last data filed at 04/01/2024 0931 Gross per 24 hour  Intake 747.44 ml  Output 1625 ml  Net -877.56 ml     Physical Exam: General: Ill-appearing  HEENT Moist mucous membranes  Pulm/lungs Normal breathing effort on room air  CVS/Heart Irregular rhythm  Abdomen:  Mildly distended  Extremities: trace pitting edema and lymphedema  Neurologic: Arousable for short periods  Skin: No acute rashes          Basic  Metabolic Panel:  Recent Labs  Lab 03/27/24 0402 03/28/24 0526 03/29/24 0407 03/29/24 0902 03/30/24 0452 03/30/24 1536 03/30/24 2024 03/31/24 0424 03/31/24 0910 03/31/24 1100 03/31/24 1502 03/31/24 2036 04/01/24 0256 04/01/24 0523 04/01/24 0816  NA 125*   < > 118*   < > 119* 124*   < > 123*   < > CANCELED BY LAB, QUESTIONABLE SPECIMEN INTEGRITY 125* 125* 126* 125* 143  K 3.1*   < > 4.8   < > 4.6 3.9  --  2.9*  --  CANCELED BY LAB, QUESTIONABLE SPECIMEN INTEGRITY 5.4* 3.3*  --  3.3*  --   CL 82*   < > 78*   < > 80* 85*  --  82*  --  CANCELED BY LAB, QUESTIONABLE SPECIMEN INTEGRITY 86*  --   --  83*  --   CO2 26   < > 26   < > 24 25  --  26  --  CANCELED BY LAB, QUESTIONABLE SPECIMEN INTEGRITY 27  --   --  28  --   GLUCOSE 153*   < > 149*   < > 204* 143*  --  97  --  CANCELED BY LAB, QUESTIONABLE SPECIMEN INTEGRITY 88  --   --  84  --   BUN 73*   < > 95*   < > 97* 89*  --  97*  --  CANCELED BY LAB, QUESTIONABLE  SPECIMEN INTEGRITY 94*  --   --  97*  --   CREATININE 1.90*   < > 3.07*   < > 3.07* 2.88*  --  2.94*  --  CANCELED BY LAB, QUESTIONABLE SPECIMEN INTEGRITY 2.67*  --   --  2.49*  --   CALCIUM  9.1   < > 9.1   < > 8.8* 8.2*  --  8.8*  --  CANCELED BY LAB, QUESTIONABLE SPECIMEN INTEGRITY 8.8*  --   --  8.9  --   MG 2.7*  --  3.6*  --  3.4*  --   --  3.1*  --   --   --   --   --  2.5*  --   PHOS  --   --  3.8  --   --   --   --  4.7*  --   --   --   --   --  4.4  --    < > = values in this interval not displayed.     CBC: Recent Labs  Lab 03/28/24 0526 03/29/24 0407 03/29/24 1257 03/29/24 1302 03/29/24 1303 03/30/24 0452 03/31/24 0424 04/01/24 0523  WBC 8.4 8.3  --   --   --  7.7 6.2 5.1  HGB 11.9* 11.6*   < > 13.9 14.3 11.6* 11.5* 11.6*  HCT 38.5 36.8   < > 41.0 42.0 37.0 36.9 37.5  MCV 70.4* 68.9*  --   --   --  70.7* 70.6* 72.1*  PLT 341 358  --   --   --  362 283 268   < > = values in this interval not displayed.      Lab Results  Component Value Date    HEPBSAG NON REACTIVE 03/21/2024   HEPBSAB NON REACTIVE 04/11/2021   HEPBIGM NON REACTIVE 03/21/2024      Microbiology:  Recent Results (from the past 240 hours)  MRSA Next Gen by PCR, Nasal     Status: None   Collection Time: 03/22/24 12:53 PM   Specimen: Nasal Mucosa; Nasal Swab  Result Value Ref Range Status   MRSA by PCR Next Gen NOT DETECTED NOT DETECTED Final    Comment: (NOTE) The GeneXpert MRSA Assay (FDA approved for NASAL specimens only), is one component of a comprehensive MRSA colonization surveillance program. It is not intended to diagnose MRSA infection nor to guide or monitor treatment for MRSA infections. Test performance is not FDA approved in patients less than 20 years old. Performed at [facility], [address] [place], [place] [address]     Coagulation Studies: Recent Labs    03/30/24 1130  LABPROT 18.0*  INR 1.5*    Urinalysis: No results for input(s): COLORURINE, LABSPEC, PHURINE, GLUCOSEU, HGBUR, BILIRUBINUR, KETONESUR, PROTEINUR, UROBILINOGEN, NITRITE, LEUKOCYTESUR in the last 72 hours.  Invalid input(s): APPERANCEUR    Imaging: No results found.      Medications:      allopurinol   100 mg Oral Daily   apixaban   5 mg Oral BID   Chlorhexidine  Gluconate Cloth  6 each Topical Daily   docusate sodium   100 mg Oral BID   feeding supplement  237 mL Oral BID BM   folic acid   1 mg Oral Daily   insulin  aspart  0-15 Units Subcutaneous TID WC   insulin  aspart  0-5 Units Subcutaneous QHS   insulin  aspart  3 Units Subcutaneous TID WC   insulin  glargine-yfgn  10 Units Subcutaneous BID  latanoprost   1 drop Both Eyes QHS   pantoprazole   40 mg Oral BID   polyethylene glycol  17 g Oral Daily   potassium chloride   40 mEq Oral Q4H   rosuvastatin   5 mg Oral Weekly   senna  1 tablet Oral Daily   sodium chloride  flush  10-40 mL Intracatheter Q12H   acetaminophen , albuterol , bisacodyl ,  dextromethorphan-guaiFENesin , ondansetron  (ZOFRAN ) IV, mouth rinse, oxyCODONE , sodium chloride  flush, traMADol , traZODone   Assessment/ Plan:  78 y.o. female with  multiple medical problems including chronic kidney disease, hypertension, atrial fibrillation, hyperlipidemia, chronic lower extremity edema, diastolic dysfunction, diabetes, osteoarthritis of the knee, chronic back pain  admitted on 03/19/2024 for Shortness of breath [R06.02] Acute on chronic diastolic heart failure (HCC) [I50.33] Acute on chronic diastolic CHF (congestive heart failure) (HCC) [I50.33]  1.  Acute on chronic diastolic CHF, with worsening lower extremity edema and shortness of breath 2.  Acute kidney injury on chronic kidney disease stage IV.  Baseline creatinine 2.6/GFR 19 from April 2025. 3.  Hypokalemia 4.  Hyponatremia. 5.  Diabetes type 2 with CKD.  Hemoglobin A1c 7.4% from February 2025. 6.  Constipation  2D echo 03/20/2024-LVEF 60 to 65%, moderate concentric LVH, normal right ventricular systolic function small pericardial effusion, normal aortic valve, severe mitral annular calcification  Plan: RHC on 6/2 showed severely decreased cardiac output, biventricular failure with RV dysfunction. Cardiology feels this is end stage diastolic heart failure. Milrinone  drip with IV Furosemide .  Repeat RHC with [facility] which showed moderately elevated biventricular filling pressures, constrictive disease and moderate pulmonary hypertension.                                                                     All drips have been stopped. Renal function remains stable. Urine output in past 24 hours.  Potassium 3.3, preceiving oral supplementation.  Sodium corrected, 143 Hypoglycemia noted last night, well controlled today.  Continues to complain of constipation.   LOS: [address] 6/12/202511:17 AM  [address] [place], [place] [phone]

## 2024-04-01 NOTE — Progress Notes (Addendum)
 [facility] Liaison Note:   (new referral for outpatient palliative services)  Notified by [facility], Sherlyn Ditto, RN,   of patient/family request for [facility] Palliative Care services at a SNF rehab when a bed is found.  Referral submitted today.    Please call with any hospice or outpatient palliative care related questions.  Thank you for the opportunity to participate in this patient's care.  Helmut Lobe, [facility] Liaison [phone]

## 2024-04-01 NOTE — Progress Notes (Signed)
 Advanced Heart Failure Rounding Note   Subjective:     Feels similarly, though is somewhat improving from a hemodynamic perspective. She is currently off pressors and renal function is slowly improving, made 2L of urine yesterday without diuretics. As discussed with patient, she is not a surgical candidate given her weakness and persistent renal dysfunction and based on discussions she would not want to prolong her suffering.  As she currently stands, hope is that she could not be able to discharge with home hospice. Would need support, diuretics for comfort, hospital bed but given her improvement she should be able to make it home for the time she has left.  Will give additional dose of HTS today, can likely restart diuretics tomorrow.    Objective:   Weight Range:  Vital Signs:   Temp:  [97.4 F (36.3 C)-97.7 F (36.5 C)] 97.4 F (36.3 C) (06/12 0800) Pulse Rate:  [58-92] 72 (06/12 0800) Resp:  [13-24] 19 (06/12 0800) BP: (84-119)/(50-69) 108/58 (06/12 0800) SpO2:  [92 %-100 %] 93 % (06/12 0800) Weight:  [70.5 kg] 70.5 kg (06/12 0500) Last BM Date : 03/28/24  Weight change: Filed Weights   03/29/24 0945 03/31/24 0500 04/01/24 0500  Weight: 68.8 kg 69 kg 70.5 kg    Intake/Output:   Intake/Output Summary (Last 24 hours) at 04/01/2024 0913 Last data filed at 04/01/2024 0800 Gross per 24 hour  Intake 617.44 ml  Output 1475 ml  Net -857.56 ml     Physical Exam: General:  Elderly chronically ill appearing, frail Cor: Irregular, normal rate, JVP mildly elevated, CVP 13, improved, 2/6 AS Lungs: clear Abdomen: soft Extremities: no cyanosis, clubbing, rash, tr edema Neuro: alert & orientedx3, cranial nerves grossly intact. moves all 4 extremities w/o difficulty. Affect pleasant  Telemetry: AF 70-80s  Labs: Basic Metabolic Panel: Recent Labs  Lab 03/27/24 0402 03/28/24 0526 03/29/24 0407 03/29/24 0902 03/30/24 0452 03/30/24 1536 03/30/24 2024 03/31/24 0424  03/31/24 0910 03/31/24 1100 03/31/24 1502 03/31/24 2036 04/01/24 0256 04/01/24 0523  NA 125*   < > 118*   < > 119* 124*   < > 123*   < > CANCELED BY LAB, QUESTIONABLE SPECIMEN INTEGRITY 125* 125* 126* 125*  K 3.1*   < > 4.8   < > 4.6 3.9  --  2.9*  --  CANCELED BY LAB, QUESTIONABLE SPECIMEN INTEGRITY 5.4* 3.3*  --  3.3*  CL 82*   < > 78*   < > 80* 85*  --  82*  --  CANCELED BY LAB, QUESTIONABLE SPECIMEN INTEGRITY 86*  --   --  83*  CO2 26   < > 26   < > 24 25  --  26  --  CANCELED BY LAB, QUESTIONABLE SPECIMEN INTEGRITY 27  --   --  28  GLUCOSE 153*   < > 149*   < > 204* 143*  --  97  --  CANCELED BY LAB, QUESTIONABLE SPECIMEN INTEGRITY 88  --   --  84  BUN 73*   < > 95*   < > 97* 89*  --  97*  --  CANCELED BY LAB, QUESTIONABLE SPECIMEN INTEGRITY 94*  --   --  97*  CREATININE 1.90*   < > 3.07*   < > 3.07* 2.88*  --  2.94*  --  CANCELED BY LAB, QUESTIONABLE SPECIMEN INTEGRITY 2.67*  --   --  2.49*  CALCIUM  9.1   < > 9.1   < >  8.8* 8.2*  --  8.8*  --  CANCELED BY LAB, QUESTIONABLE SPECIMEN INTEGRITY 8.8*  --   --  8.9  MG 2.7*  --  3.6*  --  3.4*  --   --  3.1*  --   --   --   --   --  2.5*  PHOS  --   --  3.8  --   --   --   --  4.7*  --   --   --   --   --  4.4   < > = values in this interval not displayed.    Liver Function Tests: Recent Labs  Lab 03/28/24 0526  AST 21  ALT 16  ALKPHOS 88  BILITOT 2.6*  PROT 6.3*  ALBUMIN  3.4*   No results for input(s): LIPASE, AMYLASE in the last 168 hours. No results for input(s): AMMONIA in the last 168 hours.  CBC: Recent Labs  Lab 03/28/24 0526 03/29/24 0407 03/29/24 1257 03/29/24 1302 03/29/24 1303 03/30/24 0452 03/31/24 0424 04/01/24 0523  WBC 8.4 8.3  --   --   --  7.7 6.2 5.1  HGB 11.9* 11.6*   < > 13.9 14.3 11.6* 11.5* 11.6*  HCT 38.5 36.8   < > 41.0 42.0 37.0 36.9 37.5  MCV 70.4* 68.9*  --   --   --  70.7* 70.6* 72.1*  PLT 341 358  --   --   --  362 283 268   < > = values in this interval not displayed.     Cardiac Enzymes: No results for input(s): CKTOTAL, CKMB, CKMBINDEX, TROPONINI in the last 168 hours.  BNP: BNP (last 3 results) Recent Labs    12/19/23 1624 03/19/24 1038  BNP 132.4* 265.4*    ProBNP (last 3 results) No results for input(s): PROBNP in the last 8760 hours.    Medications:     Scheduled Medications:  allopurinol   100 mg Oral Daily   apixaban   5 mg Oral BID   Chlorhexidine  Gluconate Cloth  6 each Topical Daily   docusate sodium   100 mg Oral BID   feeding supplement  237 mL Oral BID BM   folic acid   1 mg Oral Daily   insulin  aspart  0-15 Units Subcutaneous TID WC   insulin  aspart  0-5 Units Subcutaneous QHS   insulin  aspart  3 Units Subcutaneous TID WC   insulin  glargine-yfgn  10 Units Subcutaneous BID   latanoprost   1 drop Both Eyes QHS   pantoprazole   40 mg Oral BID   polyethylene glycol  17 g Oral Daily   potassium chloride   40 mEq Oral Q4H   rosuvastatin   5 mg Oral Weekly   senna  1 tablet Oral Daily   sodium chloride  flush  10-40 mL Intracatheter Q12H    Infusions:    PRN Medications: acetaminophen , albuterol , bisacodyl , dextromethorphan-guaiFENesin , ondansetron  (ZOFRAN ) IV, mouth rinse, oxyCODONE , sodium chloride  flush, traMADol , traZODone    Assessment/Plan:   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 Given goals of care and the mechanism of her dysfunction do not believe that inotropes will be beneficial - 1x dose of 150mg  3% HTS,  likely start oral diuretics tomorrow, volume status appears stable for her - Patient appears to be more inclined to pursue comfort care, discussion again on Friday  AKI on CKD 3b  due to cardio-renal/hepatorenal syndrome:  - baseline Cr ~2.0. Up to 2.8 on admission -Indicies of diuretic resistance improving, hyponatremia now to 125 this morning, creatinine now 2.49.  - nephrology following, appreciate recs - No indication for CRRT  Moderate to severe AS/moderate MR - stable, suspect somewhat contributing    Small pericardial Effusion - h/o pericardial effusion s/p pericardiocentesis 700 ml in 07/2021. Bloody, neg for malignancy. - small w/o tamponade on echo - MRI ok   Atrial Fibrillation: Permanent - hold ? 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

## 2024-04-01 NOTE — Progress Notes (Signed)
 PROGRESS NOTE    Karen Farley  NWG:[id number] DOB: 1946-06-05 DOA: 03/19/2024 PCP: Solomon Dupre, DO    Brief Narrative:   78 yo female who presented to [facility] ER on 05/30 from home with abdominal distention, leg swelling, and shortness of breath with exertion onset of symptoms several weeks ago.  Pt reported she had been unable to tolerate laying flat at night due to shortness of breath.  At baseline she uses a walker to ambulate, but has only been able to take a few steps due to symptoms outlined above.  She also endorsed a 15 lb weight gain from her baseline over a 3 week period.  She has been compliant with her outpatient torsemide  and metolazone , and she recently has taken a double dose of her torsemide  recently.  Due to worsening symptoms pt proceeded to the ER for evaluation.  All hx obtained from EDP note.   ED Course Upon arrival to the ER significant lab results were: Na+ 127/glucose 159/BUN 76/creatinine 2.81/phosphorous 4.7/total bilirubin 2.1/BNP 265.4/troponin 26.  CT Chest revealed borderline cardiomegaly with mild diffuse pericardial thickening vs. small volume fluid; esophagitis vs. reflux; and suspected liver cirrhosis.  CXR negative for acute cardiopulmonary disease.  She received 40 mg iv lasix .  She was subsequently admitted to the telemetry unit for additional workup and treatment by hospitalist team.  See detailed hospital course below under significant events.   05/30: Pt admitted to the telemetry unit with acute on chronic respiratory failure secondary to acute on chronic diastolic CHF  05/31: No significant ascites.  Lobular and heterogeneous appearance of the liver consistent        with history of cirrhosis.  Due to worsening renal failure lasix  held  05/31: Echo revealed 60 to 65%; echogenic material in the pericardium lateral to the RV apex that has been visualized on prior echo may represent fat pad vs. prior hemopericardium; small pericardial effusion without  tamponade; mild mitral valve regurgitation; mild mitral stenosis; severe thickening of aortic valve  06/02: Right heart catheterization revealed severely elevated right atrial pressure, mild pulmonary hypertension, moderately elevated wedge pressure, and severely reduced cardiac output.  Pt transferred to ICU to start milrinone  gtt, iv diuresis, and levophed  gtt.  PCCM team consulted to assist with management.  06/03: Pt remains on levophed  and milrinone  gtts overnight UOP 1.3L  06/04: No significant events noted overnight.  Afebrile, remains on Milrinone  (0.25) and Levophed  (titrated down to 6 mcg), Coox is 66.4.  Creatinine slightly improved to 2.1 from 2.3, UOP 3.5 L last 24 hrs (net - 4.2 L) with aggressive diuresis.  Reports she was able to sleep with Tramadol  last night 06/05: No significant events noted overnight, afebrile. Remains on Milrinone  and Levophed  (7 mcg), Coox 63.9.  Creatinine improved to 1.8 from 2.1, UOP 2.2 L last 24 hrs (net - 5.9 L ) with diuresis, plan to continue per HF team.  Plan for Cardiac MRI to look for infiltrative disease.  Consult PT/OT 06/06: Pt remains on milrinone  and levophed  gtt @6  mcg/min  06/07: Remains on 6 mcg of norepinephrine  and milrinone  infusion 06/08: Remain on levophed  and milrinone  gtts  06/11: Pt currently requiring vasopressin gtt @0 .03 units/min to maintain map >65.  Diuresed well overnight UOP 2.5L and currently net negative 12L this admission  6/12: Care transferred to [facility] hospitalist service.  All vasopressor drips have been weaned off.  Case discussed with heart failure service.  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  Would recommend  optimizing is much as possible and returning home with hospice services.  After discussion with TOC and hospice liaison patient stated that her husband is unable to provide her care and support that she needs at home and would like to see if skilled nursing facility is a possibility with outpatient  palliative following.  Assessment & Plan:   Principal Problem:   Acute on chronic diastolic heart failure (HCC) Active Problems:   Myocardial injury   Hyponatremia   Acute renal failure superimposed on stage 3b chronic kidney disease (HCC)   HTN (hypertension)   Atrial fibrillation, chronic (HCC)   Hyperlipidemia   Type 2 diabetes mellitus with renal complication (HCC)   Overweight (BMI 25.0-29.9)   Acute on chronic diastolic CHF (congestive heart failure) (HCC)   Hypotension   Cirrhosis of liver with ascites (HCC) #Acute chronic diastolic CHF  #Mild pulmonary hypertension  #Permanent atrial fibrillation  #Small pericardial effusion  #Mitral regurgitation  #Cardiorenal syndrome  #Constrictive pericarditis  Hx: Pericardial effusion, HLD, and HTN  RHC 03/22/2024: showed severely decreased cardiac output, biventricular failure with RV dysfunction.  - Cardiology and Heart Failure Team consulted appreciate input: RHC revealed constrictive pericarditis, however pt deemed not a surgical candidate given her significant debility and renal function.  Pts family interested in transfer to [facility] for second opinion per heart failure teams progress notes.  Will continue to diurese per recs  Plan: Discontinue daily co-ox.  Discontinue vasopressors.  Can transfer to MedSurg unit.  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  Mechanical heart disease is essentially unrecoverable and the patient is no longer a surgical candidate.  Difficult situation however at this point we are out of appreciable options and recommendation would be for patient to return home with hospice services.  At this point home may be a difficult disposition as the patient's husband is unable to care for her.  I will attempt to look for short-term rehab stay.  TOC and palliative care following.    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  Dialysis on  hold.  Likely poor candidate for dialysis but agreeable if clinical situation was dictated   #Acute respiratory failure secondary to CHF exacerbation  On 2 L nasal cannula.  Continue to wean as tolerated  #Constipation  - Continue aggressive bowel regimen    #H/o Cirrhosis  - Trend hepatic function panel  - Avoid hepatotoxic agents as able    #Type II diabetes mellitus  - CBG's ac/hs  - SSI, scheduled novolog , and scheduled semglee   - Follow hypo/hyperglycemic protocol  - Target range 140 to 180    DVT prophylaxis: SQH Code Status: DNR Family Communication: None today Disposition Plan: Status is: Inpatient Remains inpatient appropriate because: Multiple acute issues as above   Level of care: Med-Surg  Consultants:  Palliative care Nephrology Heart failure  Procedures:  Right left heart catheterization  Antimicrobials: None   Subjective: Seen and examined.  Weak and fatigued.  Understanding of situation.  Objective: Vitals:   04/01/24 1000 04/01/24 1100 04/01/24 1200 04/01/24 1300  BP: 110/66 103/72 94/69 112/68  Pulse: 85 73  78  Resp: 16 15 16 17   Temp:   98.4 F (36.9 C)   TempSrc:   Axillary   SpO2: 96% 95%  96%  Weight:      Height:        Intake/Output Summary (Last 24 hours) at 04/01/2024 1433 Last data filed at 04/01/2024  1200 Gross per 24 hour  Intake 624.63 ml  Output 1800 ml  Net -1175.37 ml   Filed Weights   03/29/24 0945 03/31/24 0500 04/01/24 0500  Weight: 68.8 kg 69 kg 70.5 kg    Examination:  General exam: Appears weak and fatigued Respiratory system: Clear to auscultation. Respiratory effort normal. Cardiovascular system: S1-S2, distant heart sounds, 2/6 systolic murmur Gastrointestinal system: Soft NT/ND, normal bowel sounds Central nervous system: Alert and oriented. No focal neurological deficits. Extremities: Diminished power bilaterally Skin: No rashes, lesions or ulcers Psychiatry: Judgement and insight appear normal.  Mood & affect appropriate.     Data Reviewed: I have personally reviewed following labs and imaging studies  CBC: Recent Labs  Lab 03/28/24 0526 03/29/24 0407 03/29/24 1257 03/29/24 1302 03/29/24 1303 03/30/24 0452 03/31/24 0424 04/01/24 0523  WBC 8.4 8.3  --   --   --  7.7 6.2 5.1  HGB 11.9* 11.6*   < > 13.9 14.3 11.6* 11.5* 11.6*  HCT 38.5 36.8   < > 41.0 42.0 37.0 36.9 37.5  MCV 70.4* 68.9*  --   --   --  70.7* 70.6* 72.1*  PLT 341 358  --   --   --  362 283 268   < > = values in this interval not displayed.   Basic Metabolic Panel: Recent Labs  Lab 03/27/24 0402 03/28/24 0526 03/29/24 0407 03/29/24 0902 03/30/24 0452 03/30/24 1536 03/31/24 0424 03/31/24 0910 03/31/24 1100 03/31/24 1502 03/31/24 2036 04/01/24 0256 04/01/24 0523 04/01/24 0816 04/01/24 1341  NA 125*   < > 118*   < > 119*   < > 123*   < > CANCELED BY LAB, QUESTIONABLE SPECIMEN INTEGRITY 125* 125* 126* 125* CANCELED BY LAB, QUESTIONABLE SPECIMEN INTEGRITY 123*  K 3.1*   < > 4.8   < > 4.6   < > 2.9*  --  CANCELED BY LAB, QUESTIONABLE SPECIMEN INTEGRITY 5.4* 3.3*  --  3.3*  --  5.1  CL 82*   < > 78*   < > 80*   < > 82*  --  CANCELED BY LAB, QUESTIONABLE SPECIMEN INTEGRITY 86*  --   --  83*  --  83*  CO2 26   < > 26   < > 24   < > 26  --  CANCELED BY LAB, QUESTIONABLE SPECIMEN INTEGRITY 27  --   --  28  --  26  GLUCOSE 153*   < > 149*   < > 204*   < > 97  --  CANCELED BY LAB, QUESTIONABLE SPECIMEN INTEGRITY 88  --   --  84  --  157*  BUN 73*   < > 95*   < > 97*   < > 97*  --  CANCELED BY LAB, QUESTIONABLE SPECIMEN INTEGRITY 94*  --   --  97*  --  95*  CREATININE 1.90*   < > 3.07*   < > 3.07*   < > 2.94*  --  CANCELED BY LAB, QUESTIONABLE SPECIMEN INTEGRITY 2.67*  --   --  2.49*  --  2.43*  CALCIUM  9.1   < > 9.1   < > 8.8*   < > 8.8*  --  CANCELED BY LAB, QUESTIONABLE SPECIMEN INTEGRITY 8.8*  --   --  8.9  --  9.0  MG 2.7*  --  3.6*  --  3.4*  --  3.1*  --   --   --   --   --  2.5*  --   --   PHOS  --    --  3.8  --   --   --  4.7*  --   --   --   --   --  4.4  --   --    < > = values in this interval not displayed.   GFR: Estimated Creatinine Clearance: 16.7 mL/min (A) (by C-G formula based on SCr of 2.43 mg/dL (H)). Liver Function Tests: Recent Labs  Lab 03/28/24 0526  AST 21  ALT 16  ALKPHOS 88  BILITOT 2.6*  PROT 6.3*  ALBUMIN  3.4*   No results for input(s): LIPASE, AMYLASE in the last 168 hours. No results for input(s): AMMONIA in the last 168 hours. Coagulation Profile: Recent Labs  Lab 03/30/24 1130  INR 1.5*   Cardiac Enzymes: No results for input(s): CKTOTAL, CKMB, CKMBINDEX, TROPONINI in the last 168 hours. BNP (last 3 results) No results for input(s): PROBNP in the last 8760 hours. HbA1C: No results for input(s): HGBA1C in the last 72 hours. CBG: Recent Labs  Lab 03/31/24 2153 03/31/24 2228 04/01/24 0301 04/01/24 0724 04/01/24 1132  GLUCAP 58* 80 91 93 144*   Lipid Profile: No results for input(s): CHOL, HDL, LDLCALC, TRIG, CHOLHDL, LDLDIRECT in the last 72 hours. Thyroid  Function Tests: No results for input(s): TSH, T4TOTAL, FREET4, T3FREE, THYROIDAB in the last 72 hours. Anemia Panel: No results for input(s): VITAMINB12, FOLATE, FERRITIN, TIBC, IRON , RETICCTPCT in the last 72 hours. Sepsis Labs: No results for input(s): PROCALCITON, LATICACIDVEN in the last 168 hours.  No results found for this or any previous visit (from the past 240 hours).       Radiology Studies: No results found.      Scheduled Meds:  allopurinol   100 mg Oral Daily   apixaban   5 mg Oral BID   Chlorhexidine  Gluconate Cloth  6 each Topical Daily   docusate sodium   100 mg Oral BID   feeding supplement  237 mL Oral BID BM   folic acid   1 mg Oral Daily   insulin  aspart  0-15 Units Subcutaneous TID WC   insulin  aspart  0-5 Units Subcutaneous QHS   insulin  aspart  3 Units Subcutaneous TID WC   insulin   glargine-yfgn  10 Units Subcutaneous BID   latanoprost   1 drop Both Eyes QHS   pantoprazole   40 mg Oral BID   polyethylene glycol  17 g Oral Daily   rosuvastatin   5 mg Oral Weekly   senna  1 tablet Oral Daily   sodium chloride  flush  10-40 mL Intracatheter Q12H   Continuous Infusions:   LOS: 13 days     Tiajuana Fluke, MD [facility]   If 7PM-7AM, please contact night-coverage  04/01/2024, 2:33 PM

## 2024-04-02 DIAGNOSIS — I5033 Acute on chronic diastolic (congestive) heart failure: Secondary | ICD-10-CM | POA: Diagnosis not present

## 2024-04-02 DIAGNOSIS — N179 Acute kidney failure, unspecified: Secondary | ICD-10-CM | POA: Diagnosis not present

## 2024-04-02 DIAGNOSIS — Z515 Encounter for palliative care: Secondary | ICD-10-CM | POA: Diagnosis not present

## 2024-04-02 DIAGNOSIS — K7460 Unspecified cirrhosis of liver: Secondary | ICD-10-CM | POA: Diagnosis not present

## 2024-04-02 LAB — GLUCOSE, CAPILLARY
Glucose-Capillary: 141 mg/dL — ABNORMAL HIGH (ref 70–99)
Glucose-Capillary: 220 mg/dL — ABNORMAL HIGH (ref 70–99)
Glucose-Capillary: 160 mg/dL — ABNORMAL HIGH (ref 70–99)
Glucose-Capillary: 118 mg/dL — ABNORMAL HIGH (ref 70–99)

## 2024-04-02 LAB — SODIUM
Sodium: 124 mmol/L — ABNORMAL LOW (ref 135–145)
Sodium: 125 mmol/L — ABNORMAL LOW (ref 135–145)

## 2024-04-02 NOTE — TOC Progression Note (Signed)
 Transition of Care [facility]) - Progression Note    Patient Details  Name: Karen Farley MRN: [id number] Date of Birth: 01/09/1946  [facility] CM/SW Contact  Kambrea Carrasco A Hadiya Spoerl, RN Phone Number: 04/02/2024, 9:09 AM  Clinical Narrative:    Chart reviewed.  Fl2 completed and Passr obtained.  I have faxed out to [facility] area facilities.  TOC will continue to follow for discharge planning.    Expected Discharge Plan: Home w Home Health Services Barriers to Discharge: Continued Medical Work up  Expected Discharge Plan and Services   Discharge Planning Services: CM Consult Post Acute Care Choice: Home Health Living arrangements for the past 2 months: Single Family Home                 DME Arranged:  (walker with wheels, at home.  Patient reports that she has a seat the pulls out in the shower.)         HH Arranged: RN, PT, OT HH Agency: [facility] Date [facility] Agency Contacted: 03/25/24   Representative spoke with at [facility] Agency: [place]    Social Determinants of Health (SDOH) Interventions SDOH Screenings   Food Insecurity: No Food Insecurity (03/19/2024)  Housing: Low Risk  (03/19/2024)  Transportation Needs: No Transportation Needs (03/19/2024)  Utilities: Not At Risk (03/19/2024)  Alcohol Screen: Low Risk  (07/23/2022)  Depression (PHQ2-9): Low Risk  (12/19/2023)  Financial Resource Strain: Low Risk  (07/23/2022)  Physical Activity: Sufficiently Active (07/23/2022)  Social Connections: Moderately Integrated (03/30/2024)  Stress: No Stress Concern Present (07/23/2022)  Tobacco Use: Low Risk  (03/19/2024)    Readmission Risk Interventions     No data to display

## 2024-04-02 NOTE — Progress Notes (Signed)
 Daily Progress Note   Patient Name: Karen Farley       Date: 04/02/2024 DOB: 05-Feb-1946  Age: 78 y.o. MRN#: [id number] Attending Physician: Tiajuana Fluke, MD Primary Care Physician: Solomon Dupre, DO Admit Date: 03/19/2024  Reason for Consultation/Follow-up: Establishing goals of care  HPI/Brief Hospital Review: 78 y.o. female  with past medical history of permanent A-fib, HFpEF, CKD stage III, type 1 diabetes, HLD, HTN, pericardial effusion who presented with dyspnea on exertion, abdominal distention, leg edema for several weeks.  She was admitted on 03/19/2024 with acute on chronic diastolic heart failure, pulmonary hypertension with evidence of RV compromise, pericardial effusion, cardiorenal syndrome with AKI on CKD, hyponatremia, respiratory failure secondary to CHF exacerbation, and others.   S/p R/L heart cath, ultimately end stage cardiomyopathy, not a candidate for surgical intervention  Palliative Medicine consulted for assisting with goals of care conversations.  Subjective: Extensive chart review has been completed prior to meeting patient including labs, vital signs, imaging, progress notes, orders, and available advanced directive documents from current and previous encounters.    Visited with Ms. Delprado at her bedside.  She is awake, alert, and able to engage in conversation.  Husband Currie Douse at bedside during clinic visit.  Assessed symptoms.  She denies acute pain or discomfort.  She shares she rests well at night and her appetite is stable.  Ms. Vandevender and Currie Douse able to share their understanding of current medical situation.  Ms. Hollern aware of the severity of her underlying cardiac disease with limited intervention.  She she is aware her long-term prognosis  is poor.  In light of this understanding I inquired about disposition plans.  Ms. Liska shares she has considered STR to hopefully regain strength and mobility to be able to return home and care for herself.  We briefly discussed based on the severity of her underlying cardiac condition her weakness is likely to progress.  She discusses being extremely fatigued after taking a few bites of breakfast which required her to rest for some time to recover.  She is unsure at this time if she will be able to participate in therapy.  She is coming to understand her weakness is likely due to her underlying disease process.  In-depth discussion regarding hospice services had with Ms. Gadsby and her husband.  We  discussed services offered under hospice care.  We also discussed the difference between home with hospice, long-term care with hospice versus IPU.  At this time shared with Ms. Jonas that she is not appropriate for IPU leaving the options of home with hospice versus long-term care with hospice.  Shared there would be substantial cost associated with long-term care as Medicare does not cover the cost.  Currie Douse concerned about being able to care for himself and that time.  They are agreeable to meeting with hospice liaison to discuss further and review potential DME needs.  Engaged TOC and hospice liaison as previous plan was to consider outpatient palliative services following during STR stay with transition to hospice once discharged.  Answered and addressed all questions and concerns.  PMT to step away from daily visits as plans and goals remain clear.  PMT to remain available peripherally please reengage as needs or concerns arise.  Objective:  Physical Exam Constitutional:      General: She is not in acute distress.    Appearance: She is ill-appearing.  Pulmonary:     Effort: Pulmonary effort is normal. No respiratory distress.   Musculoskeletal:     Right lower leg: Edema present.    Skin:    General: Skin is warm and dry.   Neurological:     Mental Status: She is alert and oriented to person, place, and time.     Motor: Weakness present.             Vital Signs: BP 103/60 (BP Location: Left Leg)   Pulse 90   Temp 97.7 F (36.5 C)   Resp 18   Ht 5' (1.524 m)   Wt 71.2 kg   SpO2 100%   BMI 30.66 kg/m  SpO2: SpO2: 100 % O2 Device: O2 Device: Room Air O2 Flow Rate: O2 Flow Rate (L/min): 2 L/min   Palliative Care Assessment & Plan   Assessment/Recommendation/Plan  Family to meet with hospice liaison to discuss discharging home with hospice  Care plan was discussed with primary team, hospice liaison and [facility]  Thank you for allowing the Palliative Medicine Team to assist in the care of this patient.  Total time:  50 minutes  Time spent includes: Detailed review of medical records (labs, imaging, vital signs), medically appropriate exam (mental status, respiratory, cardiac, skin), discussed with treatment team, counseling and educating patient, family and staff, documenting clinical information, medication management and coordination of care.  Isadore Marble, DNP, AGNP-C Palliative Medicine   Please contact Palliative Medicine Team phone at [phone] for questions and concerns.

## 2024-04-02 NOTE — Progress Notes (Signed)
 [facility] [place], [place] 04/02/24  Subjective:   Hospital day # 14   Patient known to our practice from outpatient follow-up of CKD.  She presented to the emergency room with worsening shortness of breath with exertion.  Normally she is able to drive and carry on activities of daily living with ease.  For the last 2 to 3 weeks and especially last 4 days she has been getting short of breath and having to rest even to go from 1 room to the next.  She has developed worsening lower extremity edema.  She is now presented to the hospital for further management.  Update Patient transition to floor care. Urine output was 1.5 L over the preceding 24 hours. Appears to be breathing comfortably at the moment. Considering home with hospice.   Renal: 06/12 0701 - 06/13 0700 In: 130 [P.O.:120; I.V.:10] Out: 1550 [Urine:1550] Lab Results  Component Value Date   CREATININE 2.43 (H) 04/01/2024   CREATININE 2.49 (H) 04/01/2024   CREATININE 2.67 (H) 03/31/2024     Objective:  Vital signs in last 24 hours:  Temp:  [97.5 F (36.4 C)-97.7 F (36.5 C)] 97.7 F (36.5 C) (06/13 1136) Pulse Rate:  [70-90] 90 (06/13 1136) Resp:  [18] 18 (06/13 1136) BP: (102-121)/(60-71) 103/60 (06/13 1136) SpO2:  [93 %-100 %] 100 % (06/13 1136) Weight:  [71.2 kg] 71.2 kg (06/13 0500)  Weight change: 0.7 kg Filed Weights   03/31/24 0500 04/01/24 0500 04/02/24 0500  Weight: 69 kg 70.5 kg 71.2 kg    Intake/Output:    Intake/Output Summary (Last 24 hours) at 04/02/2024 1604 Last data filed at 04/02/2024 0900 Gross per 24 hour  Intake 100 ml  Output 750 ml  Net -650 ml     Physical Exam: General: Ill-appearing  HEENT Moist mucous membranes  Pulm/lungs Normal breathing effort on room air  CVS/Heart Irregular rhythm  Abdomen:  Mildly distended  Extremities: trace pitting edema and lymphedema  Neurologic: Arousable for short periods  Skin: No acute rashes          Basic  Metabolic Panel:  Recent Labs  Lab 03/27/24 0402 03/28/24 0526 03/29/24 0407 03/29/24 0902 03/30/24 0452 03/30/24 1536 03/31/24 0424 03/31/24 0910 03/31/24 1100 03/31/24 1502 03/31/24 2036 04/01/24 0256 04/01/24 0523 04/01/24 0816 04/01/24 1341 04/02/24 0125 04/02/24 0609  NA 125*   < > 118*   < > 119*   < > 123*   < > CANCELED BY LAB, QUESTIONABLE SPECIMEN INTEGRITY 125* 125*   < > 125* CANCELED BY LAB, QUESTIONABLE SPECIMEN INTEGRITY 123* 124* 125*  K 3.1*   < > 4.8   < > 4.6   < > 2.9*  --  CANCELED BY LAB, QUESTIONABLE SPECIMEN INTEGRITY 5.4* 3.3*  --  3.3*  --  5.1  --   --   CL 82*   < > 78*   < > 80*   < > 82*  --  CANCELED BY LAB, QUESTIONABLE SPECIMEN INTEGRITY 86*  --   --  83*  --  83*  --   --   CO2 26   < > 26   < > 24   < > 26  --  CANCELED BY LAB, QUESTIONABLE SPECIMEN INTEGRITY 27  --   --  28  --  26  --   --   GLUCOSE 153*   < > 149*   < > 204*   < > 97  --  CANCELED BY  LAB, QUESTIONABLE SPECIMEN INTEGRITY 88  --   --  84  --  157*  --   --   BUN 73*   < > 95*   < > 97*   < > 97*  --  CANCELED BY LAB, QUESTIONABLE SPECIMEN INTEGRITY 94*  --   --  97*  --  95*  --   --   CREATININE 1.90*   < > 3.07*   < > 3.07*   < > 2.94*  --  CANCELED BY LAB, QUESTIONABLE SPECIMEN INTEGRITY 2.67*  --   --  2.49*  --  2.43*  --   --   CALCIUM  9.1   < > 9.1   < > 8.8*   < > 8.8*  --  CANCELED BY LAB, QUESTIONABLE SPECIMEN INTEGRITY 8.8*  --   --  8.9  --  9.0  --   --   MG 2.7*  --  3.6*  --  3.4*  --  3.1*  --   --   --   --   --  2.5*  --   --   --   --   PHOS  --   --  3.8  --   --   --  4.7*  --   --   --   --   --  4.4  --   --   --   --    < > = values in this interval not displayed.     CBC: Recent Labs  Lab 03/28/24 0526 03/29/24 0407 03/29/24 1257 03/29/24 1302 03/29/24 1303 03/30/24 0452 03/31/24 0424 04/01/24 0523  WBC 8.4 8.3  --   --   --  7.7 6.2 5.1  HGB 11.9* 11.6*   < > 13.9 14.3 11.6* 11.5* 11.6*  HCT 38.5 36.8   < > 41.0 42.0 37.0 36.9 37.5  MCV  70.4* 68.9*  --   --   --  70.7* 70.6* 72.1*  PLT 341 358  --   --   --  362 283 268   < > = values in this interval not displayed.      Lab Results  Component Value Date   HEPBSAG NON REACTIVE 03/21/2024   HEPBSAB NON REACTIVE 04/11/2021   HEPBIGM NON REACTIVE 03/21/2024      Microbiology:  No results found for this or any previous visit (from the past 240 hours).   Coagulation Studies: No results for input(s): LABPROT, INR in the last 72 hours.   Urinalysis: No results for input(s): COLORURINE, LABSPEC, PHURINE, GLUCOSEU, HGBUR, BILIRUBINUR, KETONESUR, PROTEINUR, UROBILINOGEN, NITRITE, LEUKOCYTESUR in the last 72 hours.  Invalid input(s): APPERANCEUR    Imaging: No results found.      Medications:      allopurinol   100 mg Oral Daily   apixaban   5 mg Oral BID   Chlorhexidine  Gluconate Cloth  6 each Topical Daily   docusate sodium   100 mg Oral BID   feeding supplement  237 mL Oral BID BM   folic acid   1 mg Oral Daily   insulin  aspart  0-15 Units Subcutaneous TID WC   insulin  aspart  0-5 Units Subcutaneous QHS   insulin  aspart  3 Units Subcutaneous TID WC   insulin  glargine-yfgn  10 Units Subcutaneous BID   latanoprost   1 drop Both Eyes QHS   pantoprazole   40 mg Oral BID   polyethylene glycol  17 g Oral Daily   rosuvastatin   5  mg Oral Weekly   senna  1 tablet Oral Daily   sodium chloride  flush  10-40 mL Intracatheter Q12H   acetaminophen , albuterol , bisacodyl , dextromethorphan-guaiFENesin , ondansetron  (ZOFRAN ) IV, mouth rinse, oxyCODONE , sodium chloride  flush, traMADol , traZODone   Assessment/ Plan:  78 y.o. female with  multiple medical problems including chronic kidney disease, hypertension, atrial fibrillation, hyperlipidemia, chronic lower extremity edema, diastolic dysfunction, diabetes, osteoarthritis of the knee, chronic back pain  admitted on 03/19/2024 for Shortness of breath [R06.02] Acute on chronic diastolic heart  failure (HCC) [J47.82] Acute on chronic diastolic CHF (congestive heart failure) (HCC) [I50.33]  1.  Acute on chronic diastolic CHF, with worsening lower extremity edema and shortness of breath 2.  Acute kidney injury on chronic kidney disease stage IV.  Baseline creatinine 2.6/GFR 19 from April 2025. 3.  Hypokalemia 4.  Hyponatremia. 5.  Diabetes type 2 with CKD.  Hemoglobin A1c 7.4% from February 2025. 6.  Constipation  2D echo 03/20/2024-LVEF 60 to 65%, moderate concentric LVH, normal right ventricular systolic function small pericardial effusion, normal aortic valve, severe mitral annular calcification  Plan: RHC on 6/2 showed severely decreased cardiac output, biventricular failure with RV dysfunction. Cardiology feels this is end stage diastolic heart failure. Milrinone  drip with IV Furosemide  was provided initially..  Repeat RHC with [facility] which showed moderately elevated biventricular filling pressures, constrictive disease and moderate pulmonary hypertension.                                                                     Update:  Patient now transition to floor care.  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  In regards to her underlying renal dysfunction most recent creatinine was 2.43 with an EGFR of 20.  She is not currently on any diuretics.  Serum sodium has been trending up and currently 125.  Patient and family considering home with hospice as well.   LOS: 14 Glorian Mcdonell 6/13/20254:04 PM  [facility] [place], [place] [phone]

## 2024-04-02 NOTE — NC FL2 (Signed)
 [place]  MEDICAID FL2 LEVEL OF CARE FORM     IDENTIFICATION  Patient Name: Karen Farley Birthdate: 02-10-1946 Sex: female Admission Date (Current Location): 03/19/2024  [place] and [place] Number:  Chiropodist and Address:  [facility], [address], [place], [place] [address]      Provider Number: [id number]  Attending Physician Name and Address:  Tiajuana Fluke, MD  Relative Name and Phone Number:  Layken Beg [phone]    Current Level of Care: Hospital Recommended Level of Care: Skilled Nursing Facility Prior Approval Number:    Date Approved/Denied:   PASRR Number: [id number]  Discharge Plan: SNF    Current Diagnoses: Patient Active Problem List   Diagnosis Date Noted   Hypotension 03/20/2024   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/20/2024   Acute on chronic diastolic heart failure (HCC) 03/19/2024   HTN (hypertension) 03/19/2024   Acute renal failure superimposed on stage 3b chronic kidney disease (HCC) 03/19/2024   Overweight (BMI 25.0-29.9) 03/19/2024   Myocardial injury 03/19/2024   Atrial fibrillation, chronic (HCC) 03/19/2024   Acute idiopathic gout involving toe 09/30/2023   Senile purpura (HCC) 12/13/2022   Pulmonary hypertension, primary (HCC) 09/07/2021   Pericardial effusion 08/05/2021   Acute blood loss anemia 08/04/2021   Anasarca    Stage 3b chronic kidney disease (HCC) 05/17/2021   Pleural effusion 05/17/2021   Acute on chronic diastolic CHF (congestive heart failure) (HCC) 05/17/2021   Chronic anticoagulation 05/17/2021   Acute kidney injury superimposed on CKD (HCC) 04/11/2021   Hyponatremia 04/11/2021   Acute on chronic heart failure with preserved ejection fraction (HFpEF) (HCC) 04/11/2021   Chronic venous insufficiency 08/14/2020   PAD (peripheral artery disease) (HCC) 08/14/2020   Bilateral primary osteoarthritis of knee 04/13/2020   Acquired spondylolisthesis 12/17/2019    Lumbar radiculopathy 12/17/2019   Atrial fibrillation (HCC) 11/20/2019   Closed fracture of radial styloid 08/06/2019   Displaced fracture of unspecified radial styloid process, initial encounter for closed fracture 08/06/2019   Controlled substance agreement signed 01/24/2018   Piriformis syndrome 07/01/2016   Sacroiliac joint dysfunction 01/09/2016   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 diabetes mellitus (HCC) 04/28/2015   Lymphedema 03/27/2015   Open-angle glaucoma, mild stage    Osteoarthritis of both knees    Type 2 diabetes mellitus with renal complication (HCC)    Osteopenia    Benign hypertensive renal disease    Hyperlipidemia    Degeneration of lumbosacral intervertebral disc     Orientation RESPIRATION BLADDER Height & Weight     Self, Time, Situation, Place  O2 (2L per [place]) Continent Weight: 71.2 kg Height:  5' (152.4 cm)  BEHAVIORAL SYMPTOMS/MOOD NEUROLOGICAL BOWEL NUTRITION STATUS      Continent  (See Discharge Summary)  AMBULATORY STATUS COMMUNICATION OF NEEDS Skin   Extensive Assist Verbally Normal                       Personal Care Assistance Level of Assistance  Bathing, Feeding, Dressing Bathing Assistance: Maximum assistance Feeding assistance: Limited assistance Dressing Assistance: Maximum assistance     Functional Limitations Info  Sight, Hearing, Speech Sight Info: Adequate Hearing Info: Adequate Speech Info: Adequate    SPECIAL CARE FACTORS FREQUENCY  PT (By licensed PT), OT (By licensed OT)     PT Frequency: 5x weekly OT Frequency: 5x weekly            Contractures Contractures Info: Not present  Additional Factors Info  Code Status, Allergies Code Status Info: DNR-Limited Allergies Info: No Known Allergies           Current Medications (04/02/2024):  This is the current hospital active medication list Current Facility-Administered Medications  Medication Dose Route Frequency Provider Last Rate Last Admin   acetaminophen   (TYLENOL ) tablet 650 mg  650 mg Oral Q6H PRN Wenona Hamilton, MD   650 mg at 03/22/24 2157   albuterol  (PROVENTIL ) (2.5 MG/3ML) 0.083% nebulizer solution 3 mL  3 mL Inhalation Q4H PRN Wenona Hamilton, MD       allopurinol  (ZYLOPRIM ) tablet 100 mg  100 mg Oral Daily Arida, Muhammad A, MD   100 mg at 04/01/24 6045   apixaban  (ELIQUIS ) tablet 5 mg  5 mg Oral BID Annitta Kindler, MD   5 mg at 04/01/24 2149   bisacodyl  (DULCOLAX) suppository 10 mg  10 mg Rectal Daily PRN Nelson, Dana G, NP       Chlorhexidine  Gluconate Cloth 2 % PADS 6 each  6 each Topical Daily Alphonsus Jeans, MD   6 each at 04/01/24 1000   dextromethorphan-guaiFENesin  (MUCINEX  DM) 30-600 MG per 12 hr tablet 1 tablet  1 tablet Oral BID PRN Wenona Hamilton, MD       docusate sodium  (COLACE) capsule 100 mg  100 mg Oral BID Nelson, Dana G, NP   100 mg at 04/01/24 2149   feeding supplement (ENSURE PLUS HIGH PROTEIN) liquid 237 mL  237 mL Oral BID BM Breeze, Aleatha America, NP   237 mL at 03/30/24 1439   folic acid  (FOLVITE ) tablet 1 mg  1 mg Oral Daily Arida, Muhammad A, MD   1 mg at 04/01/24 4098   insulin  aspart (novoLOG ) injection 0-15 Units  0-15 Units Subcutaneous TID WC Rust-Chester, Britton L, NP   3 Units at 04/01/24 1712   insulin  aspart (novoLOG ) injection 0-5 Units  0-5 Units Subcutaneous QHS Arida, Muhammad A, MD   2 Units at 04/01/24 2150   insulin  aspart (novoLOG ) injection 3 Units  3 Units Subcutaneous TID WC Adalberto Acton, RPH   3 Units at 03/30/24 1722   insulin  glargine-yfgn (SEMGLEE ) injection 10 Units  10 Units Subcutaneous BID Gideon Kussmaul, NP   10 Units at 04/01/24 2240   latanoprost  (XALATAN ) 0.005 % ophthalmic solution 1 drop  1 drop Both Eyes QHS Arida, Muhammad A, MD   1 drop at 04/01/24 2149   ondansetron  (ZOFRAN ) injection 4 mg  4 mg Intravenous Q8H PRN Wenona Hamilton, MD       Oral care mouth rinse  15 mL Mouth Rinse PRN Wenona Hamilton, MD       oxyCODONE  (Oxy IR/ROXICODONE )  immediate release tablet 5 mg  5 mg Oral Q6H PRN Nelson, Dana G, NP   5 mg at 03/31/24 2121   pantoprazole  (PROTONIX ) EC tablet 40 mg  40 mg Oral BID Arida, Muhammad A, MD   40 mg at 04/01/24 2148   polyethylene glycol (MIRALAX  / GLYCOLAX ) packet 17 g  17 g Oral Daily Nelson, Dana G, NP   17 g at 04/01/24 1191   rosuvastatin  (CRESTOR ) tablet 5 mg  5 mg Oral Weekly Arida, Muhammad A, MD   5 mg at 03/26/24 4782   senna (SENOKOT) tablet 8.6 mg  1 tablet Oral Daily Nelson, Dana G, NP   8.6 mg at 04/01/24 0926   sodium chloride  flush (NS) 0.9 % injection 10-40 mL  10-40 mL Intracatheter  Q12H Kasa, Kurian, MD   10 mL at 04/01/24 2149   sodium chloride  flush (NS) 0.9 % injection 10-40 mL  10-40 mL Intracatheter PRN Kasa, Kurian, MD       traMADol  (ULTRAM ) tablet 50 mg  50 mg Oral Q6H PRN Nelson, Dana G, NP   50 mg at 03/28/24 2240   traZODone  (DESYREL ) tablet 75 mg  75 mg Oral QHS PRN Nelson, Dana G, NP   75 mg at 04/02/24 0131     Discharge Medications: Please see discharge summary for a list of discharge medications.  Relevant Imaging Results:  Relevant Lab Results:   Additional Information [id number]  Tonya Carlile A Sharlotte Baka, RN

## 2024-04-02 NOTE — Progress Notes (Deleted)
 error

## 2024-04-02 NOTE — Plan of Care (Signed)
  Problem: Coping: Goal: Ability to adjust to condition or change in health will improve Outcome: Progressing   Problem: Education: Goal: Knowledge of General Education information will improve Description: Including pain rating scale, medication(s)/side effects and non-pharmacologic comfort measures Outcome: Progressing   Problem: Coping: Goal: Level of anxiety will decrease Outcome: Progressing   Problem: Education: Goal: Ability to verbalize understanding of medication therapies will improve Outcome: Progressing   Problem: Activity: Goal: Ability to return to baseline activity level will improve Outcome: Progressing

## 2024-04-03 DIAGNOSIS — I5033 Acute on chronic diastolic (congestive) heart failure: Secondary | ICD-10-CM | POA: Diagnosis not present

## 2024-04-03 LAB — GLUCOSE, CAPILLARY
Glucose-Capillary: 149 mg/dL — ABNORMAL HIGH (ref 70–99)
Glucose-Capillary: 187 mg/dL — ABNORMAL HIGH (ref 70–99)

## 2024-04-03 MED ORDER — OXYCODONE HCL 5 MG PO TABS
5.0000 mg | ORAL_TABLET | Freq: Four times a day (QID) | ORAL | 0 refills | Status: DC | PRN
Start: 2024-04-03 — End: 2024-06-30

## 2024-04-03 NOTE — TOC Transition Note (Signed)
 Transition of Care [facility]) - Discharge Note   Patient Details  Name: Karen Farley MRN: [id number] Date of Birth: Jun 11, 1946  Transition of Care [facility]) CM/SW Contact:  Crayton Docker, RN 04/03/2024, 10:48 AM   Clinical Narrative:     Discharge orders noted for home with hospice services via [facility]. BLS transport scheduled for 1430.   Final next level of care: Home w Hospice Care Barriers to Discharge: No Barriers Identified   Patient Goals and CMS Choice   CMS Medicare.gov Compare Post Acute Care list provided to:: Patient Choice offered to / list presented to : Patient    Discharge Placement   Home with hospice care Discharge Plan and Services Additional resources added to the After Visit Summary for     Discharge Planning Services: CM Consult Post Acute Care Choice: Home Health          DME Arranged:  (walker with wheels, at home.  Patient reports that she has a seat the pulls out in the shower.)     HH Arranged: RN, PT, OT HH Agency: [facility] Date [facility] Agency Contacted: 03/25/24   Representative spoke with at [facility] Agency: [place]   Social Drivers of Health (SDOH) Interventions SDOH Screenings   Food Insecurity: No Food Insecurity (03/19/2024)  Housing: Low Risk  (03/19/2024)  Transportation Needs: No Transportation Needs (03/19/2024)  Utilities: Not At Risk (03/19/2024)  Alcohol Screen: Low Risk  (07/23/2022)  Depression (PHQ2-9): Low Risk  (12/19/2023)  Financial Resource Strain: Low Risk  (07/23/2022)  Physical Activity: Sufficiently Active (07/23/2022)  Social Connections: Moderately Integrated (03/30/2024)  Stress: No Stress Concern Present (07/23/2022)  Tobacco Use: Low Risk  (03/19/2024)     Readmission Risk Interventions     No data to display

## 2024-04-03 NOTE — Plan of Care (Signed)
  Problem: Coping: Goal: Ability to adjust to condition or change in health will improve Outcome: Progressing   Problem: Metabolic: Goal: Ability to maintain appropriate glucose levels will improve Outcome: Progressing   Problem: Education: Goal: Knowledge of General Education information will improve Description: Including pain rating scale, medication(s)/side effects and non-pharmacologic comfort measures Outcome: Progressing   Problem: Clinical Measurements: Goal: Cardiovascular complication will be avoided Outcome: Progressing

## 2024-04-03 NOTE — TOC Progression Note (Addendum)
 Transition of Care [facility]) - Progression Note    Patient Details  Name: Karen Farley MRN: [id number] Date of Birth: 03-09-1946  Transition of Care [facility]) CM/SW Contact  Crayton Docker, RN 04/03/2024, 9:41 AM  Clinical Narrative:     Noted, SNF accepting offers for [facility], [facility] and [address], [address], [address] [facility], [facility], [facility] and [facility]. CM call to patient's husband, Currie Douse, phone: [phone] regarding SNF preferences. No answer, CM left message for return call.    CM to patient's room regarding discharge care planning. Patient's husband, Currie Douse and patient's sister, Felipa Horsfall at patient's bedside. Per patient and patient's husband, patient has decided to proceed with home with hospice. [facility] agency. CM and patient, patient's husband decided on no DME at this time. Per patient, prefers to transport via EMS transport.   CM call to [facility] regarding BLS transport to home address. CM spoke to [place]. BLS transport scheduled for 1430.  Expected Discharge Plan: Home w Home Health Services Barriers to Discharge: Continued Medical Work up  Expected Discharge Plan and Services   Discharge Planning Services: CM Consult Post Acute Care Choice: Home Health Living arrangements for the past 2 months: Single Family Home Expected Discharge Date: 04/03/24               DME Arranged:  (walker with wheels, at home.  Patient reports that she has a seat the pulls out in the shower.)     HH Arranged: RN, PT, OT HH Agency: [facility] Date [facility] Agency Contacted: 03/25/24   Representative spoke with at [facility] Agency: [place]    Social Determinants of Health (SDOH) Interventions SDOH Screenings   Food Insecurity: No Food Insecurity (03/19/2024)  Housing: Low Risk  (03/19/2024)  Transportation Needs: No Transportation Needs (03/19/2024)  Utilities: Not At Risk  (03/19/2024)  Alcohol Screen: Low Risk  (07/23/2022)  Depression (PHQ2-9): Low Risk  (12/19/2023)  Financial Resource Strain: Low Risk  (07/23/2022)  Physical Activity: Sufficiently Active (07/23/2022)  Social Connections: Moderately Integrated (03/30/2024)  Stress: No Stress Concern Present (07/23/2022)  Tobacco Use: Low Risk  (03/19/2024)    Readmission Risk Interventions     No data to display

## 2024-04-03 NOTE — Discharge Summary (Signed)
 Physician Discharge Summary  Karen Farley UEA:[id number] DOB: 1945-12-17 DOA: 03/19/2024  PCP: Solomon Dupre, DO  Admit date: 03/19/2024 Discharge date: 04/03/2024  Admitted From: Home Disposition:  Home with hospice  Recommendations for Outpatient Follow-up:  Per hospice providers   Home Health:No  Equipment/Devices:None   Discharge Condition:Hospice  CODE STATUS:DNR  Diet recommendation: Regular-comfort  Brief/Interim Summary: 78 yo female who presented to [facility] ER on 05/30 from home with abdominal distention, leg swelling, and shortness of breath with exertion onset of symptoms several weeks ago.  Pt reported she had been unable to tolerate laying flat at night due to shortness of breath.  At baseline she uses a walker to ambulate, but has only been able to take a few steps due to symptoms outlined above.  She also endorsed a 15 lb weight gain from her baseline over a 3 week period.  She has been compliant with her outpatient torsemide  and metolazone , and she recently has taken a double dose of her torsemide  recently.  Due to worsening symptoms pt proceeded to the ER for evaluation.  All hx obtained from EDP note.   ED Course Upon arrival to the ER significant lab results were: Na+ 127/glucose 159/BUN 76/creatinine 2.81/phosphorous 4.7/total bilirubin 2.1/BNP 265.4/troponin 26.  CT Chest revealed borderline cardiomegaly with mild diffuse pericardial thickening vs. small volume fluid; esophagitis vs. reflux; and suspected liver cirrhosis.  CXR negative for acute cardiopulmonary disease.  She received 40 mg iv lasix .  She was subsequently admitted to the telemetry unit for additional workup and treatment by hospitalist team.  See detailed hospital course below under significant events.   05/30: Pt admitted to the telemetry unit with acute on chronic respiratory failure secondary to acute on chronic diastolic CHF  05/31: No significant ascites.  Lobular and heterogeneous appearance of  the liver consistent        with history of cirrhosis.  Due to worsening renal failure lasix  held  05/31: Echo revealed 60 to 65%; echogenic material in the pericardium lateral to the RV apex that has been visualized on prior echo may represent fat pad vs. prior hemopericardium; small pericardial effusion without tamponade; mild mitral valve regurgitation; mild mitral stenosis; severe thickening of aortic valve  06/02: Right heart catheterization revealed severely elevated right atrial pressure, mild pulmonary hypertension, moderately elevated wedge pressure, and severely reduced cardiac output.  Pt transferred to ICU to start milrinone  gtt, iv diuresis, and levophed  gtt.  PCCM team consulted to assist with management.  06/03: Pt remains on levophed  and milrinone  gtts overnight UOP 1.3L  06/04: No significant events noted overnight.  Afebrile, remains on Milrinone  (0.25) and Levophed  (titrated down to 6 mcg), Coox is 66.4.  Creatinine slightly improved to 2.1 from 2.3, UOP 3.5 L last 24 hrs (net - 4.2 L) with aggressive diuresis.  Reports she was able to sleep with Tramadol  last night 06/05: No significant events noted overnight, afebrile. Remains on Milrinone  and Levophed  (7 mcg), Coox 63.9.  Creatinine improved to 1.8 from 2.1, UOP 2.2 L last 24 hrs (net - 5.9 L ) with diuresis, plan to continue per HF team.  Plan for Cardiac MRI to look for infiltrative disease.  Consult PT/OT 06/06: Pt remains on milrinone  and levophed  gtt @6  mcg/min  06/07: Remains on 6 mcg of norepinephrine  and milrinone  infusion 06/08: Remain on levophed  and milrinone  gtts  06/11: Pt currently requiring vasopressin  gtt @0 .03 units/min to maintain map >65.  Diuresed well overnight UOP 2.5L and currently net negative  12L this admission  6/12: Care transferred to [facility] hospitalist service.  All vasopressor drips have been weaned off.  Case discussed with heart failure service.  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   Would recommend optimizing is much as possible and returning home with hospice services.  After discussion with TOC and hospice liaison patient stated that her husband is unable to provide her care and support that she needs at home and would like to see if skilled nursing facility is a possibility with outpatient palliative following. 6/14: After discussion with palliative care and hospice liaison, decision made to proceed with dc home with hospice services.  Discharge Diagnoses:  Principal Problem:   Acute on chronic diastolic heart failure (HCC) Active Problems:   Myocardial injury   Hyponatremia   Acute renal failure superimposed on stage 3b chronic kidney disease (HCC)   HTN (hypertension)   Atrial fibrillation, chronic (HCC)   Hyperlipidemia   Type 2 diabetes mellitus with renal complication (HCC)   Overweight (BMI 25.0-29.9)   Acute on chronic diastolic CHF (congestive heart failure) (HCC)   Hypotension   Cirrhosis of liver with ascites (HCC)  #Acute chronic diastolic CHF  #Mild pulmonary hypertension  #Permanent atrial fibrillation  #Small pericardial effusion  #Mitral regurgitation  #Cardiorenal syndrome  #Constrictive pericarditis  Hx: Pericardial effusion, HLD, and HTN  RHC 03/22/2024: showed severely decreased cardiac output, biventricular failure with RV dysfunction.  - Cardiology and Heart Failure Team consulted appreciate input: RHC revealed constrictive pericarditis, however pt deemed not a surgical candidate given her significant debility and renal function.  Pts family interested in transfer to [name] for second opinion per heart failure teams progress notes.  Will continue to diurese per recs  Plan: Discussion with heart failure, pall care, hospice liaison Decision made to dc home with hospice services   Discharge Instructions  Discharge Instructions     Diet - low sodium heart healthy   Complete by: As directed    Increase activity slowly   Complete by: As  directed    No wound care   Complete by: As directed       Allergies as of 04/03/2024   No Known Allergies      Medication List     STOP taking these medications    apixaban  5 MG Tabs tablet Commonly known as: Eliquis    BD Disp Needles 30G X 1/2 Misc Generic drug: NEEDLE (DISP) 30 G   BD Insulin  Syringe U-500 31G X 0.5 ML Misc Generic drug: Insulin  Syringe/Needle U-500   Dexcom G7 Sensor Misc   empagliflozin  25 MG Tabs tablet Commonly known as: Jardiance    folic acid  1 MG tablet Commonly known as: FOLVITE    Gvoke HypoPen  2-Pack 0.5 MG/0.1ML Soaj Generic drug: Glucagon    INSULIN  SYRINGE 1CC/31GX5/16 31G X 5/16 1 ML Misc   metolazone  2.5 MG tablet Commonly known as: ZAROXOLYN    metoprolol  succinate 25 MG 24 hr tablet Commonly known as: TOPROL -XL   metroNIDAZOLE 1 % gel Commonly known as: METROGEL   midodrine  10 MG tablet Commonly known as: PROAMATINE    Pen Needles 32G X 4 MM Misc   potassium chloride  SA 20 MEQ tablet Commonly known as: KLOR-CON  M   rosuvastatin  5 MG tablet Commonly known as: CRESTOR    Rybelsus  7 MG Tabs Generic drug: Semaglutide    Toujeo  Max SoloStar 300 UNIT/ML Solostar Pen Generic drug: insulin  glargine (2 Unit Dial)   Ultracare Pen Needles 32G X 4 MM Misc Generic drug: Insulin  Pen Needle  TAKE these medications    allopurinol  100 MG tablet Commonly known as: ZYLOPRIM  Take 1 tablet (100 mg total) by mouth daily.   latanoprost  0.005 % ophthalmic solution Commonly known as: XALATAN  Place 1 drop into both eyes at bedtime.   oxyCODONE  5 MG immediate release tablet Commonly known as: Oxy IR/ROXICODONE  Take 1 tablet (5 mg total) by mouth every 6 (six) hours as needed for severe pain (pain score 7-10) or breakthrough pain.   pantoprazole  40 MG tablet Commonly known as: PROTONIX  Take 1 tablet (40 mg total) by mouth 2 (two) times daily.   spironolactone  50 MG tablet Commonly known as: ALDACTONE  Take 1 tablet  (50 mg total) by mouth daily.   torsemide  20 MG tablet Commonly known as: DEMADEX  TAKE 1 TO 2 TABLETS BY MOUTH DAILY. ONLY TAKE 2 TABLETS FOR 1 TO 3 DAYS AND IF PERSISTS, CALL WITH BAD SWELLING        No Known Allergies  Consultations: Palliative care Heart failure   Procedures/Studies: CARDIAC CATHETERIZATION Result Date: 03/29/2024 HEMODYNAMICS: RA:       16 mmHg (mean) RV:       40/5, 16 mmHg PA:       40/24 mmHg (29 mean) PCWP: 18 mmHg (mean)    Estimated Fick CO/CI   3.71L/min, 2.24L/min/m2    TPG  11  mmHg     PVR  2.97 Wood Units PAPi  1 IMPRESSION: Moderately elevated biventricular filling pressures Early cut off sign with equalization diastolic pressures LV and RV discordance (10-82mmHg) consistent with constrictive disease Moderate pulmonary hypertension with evidence of RV compromise Borderline cardiac output on norepinephrine  and 0.25 of milrinone  RECOMMENDATIONS: Unfortunately a poor surgical candidate given debility and worsening renal failure. Will attempt to optimize prior to CT surgery consult but suspect her prognosis is poor   MR CARDIAC MORPHOLOGY W WO CONTRAST Result Date: 03/25/2024 CLINICAL DATA:  HFpEF, evaluate for infiltrative disease EXAM: CARDIAC MRI TECHNIQUE: The patient was scanned on a 1.5 Tesla Siemens magnet. A dedicated cardiac coil was used. Functional imaging was done using Fiesta sequences. 2,3, and 4 chamber views were done to assess for RWMA's. Modified Simpson's rule using a short axis stack was used to calculate an ejection fraction on a dedicated work [employer], [employer]. The patient received 10 cc of Gadavist . After 10 minutes inversion recovery sequences were used to assess for infiltration and scar tissue. Velocity flow mapping performed in the ascending aorta and main pulmonary artery. CONTRAST:  10 cc  of Gadavist  FINDINGS: 1. Normal left ventricular size, thickness and systolic function (LVEF = 56%). There are no regional wall motion  abnormalities. There is no late gadolinium enhancement in the left ventricular myocardium. LVEDV: 54 ml LVESV: 24 ml SV: 30 ml CO: 2.5 L/min Myocardial mass: 57g LV native T1 value 1021 ms (normal <1000 ms) LV ECV value 29% (normal <30%) 2. Normal right ventricular size, thickness and systolic function (RVEF = 52%). There are no regional wall motion abnormalities. 3. Moderately dilated left atrium, mildly dilated right atrial size. 4. Normal size of the aortic root, ascending aorta and pulmonary artery. 5. Mild to moderate mitral regurgitation. Aortic valve appears calcified with atleast moderate aortic valve stenosis visually. 6.  Normal pericardium.  small pericardial effusion. IMPRESSION: 1.  Normal LV size and systolic function.  LVEF 56%. 2.  No LGE or scar. 3.  Moderate aortic stenosis. 4.  Normal RV systolic function. 5.  No evidence for inflammatory disease. Electronically Signed  By: Constancia Delton M.D.   On: 03/25/2024 23:37   MR CARDIAC VELOCITY FLOW MAP Result Date: 03/25/2024 CLINICAL DATA:  HFpEF, evaluate for infiltrative disease EXAM: CARDIAC MRI TECHNIQUE: The patient was scanned on a 1.5 Tesla Siemens magnet. A dedicated cardiac coil was used. Functional imaging was done using Fiesta sequences. 2,3, and 4 chamber views were done to assess for RWMA's. Modified Simpson's rule using a short axis stack was used to calculate an ejection fraction on a dedicated work [employer] officer, [employer]. The patient received 10 cc of Gadavist . After 10 minutes inversion recovery sequences were used to assess for infiltration and scar tissue. Velocity flow mapping performed in the ascending aorta and main pulmonary artery. CONTRAST:  10 cc  of Gadavist  FINDINGS: 1. Normal left ventricular size, thickness and systolic function (LVEF = 56%). There are no regional wall motion abnormalities. There is no late gadolinium enhancement in the left ventricular myocardium. LVEDV: 54 ml LVESV: 24 ml SV: 30 ml CO: 2.5  L/min Myocardial mass: 57g LV native T1 value 1021 ms (normal <1000 ms) LV ECV value 29% (normal <30%) 2. Normal right ventricular size, thickness and systolic function (RVEF = 52%). There are no regional wall motion abnormalities. 3. Moderately dilated left atrium, mildly dilated right atrial size. 4. Normal size of the aortic root, ascending aorta and pulmonary artery. 5. Mild to moderate mitral regurgitation. Aortic valve appears calcified with atleast moderate aortic valve stenosis visually. 6.  Normal pericardium.  small pericardial effusion. IMPRESSION: 1.  Normal LV size and systolic function.  LVEF 56%. 2.  No LGE or scar. 3.  Moderate aortic stenosis. 4.  Normal RV systolic function. 5.  No evidence for inflammatory disease. Electronically Signed   By: Constancia Delton M.D.   On: 03/25/2024 23:37   MR CARDIAC VELOCITY FLOW MAP Result Date: 03/25/2024 CLINICAL DATA:  HFpEF, evaluate for infiltrative disease EXAM: CARDIAC MRI TECHNIQUE: The patient was scanned on a 1.5 Tesla Siemens magnet. A dedicated cardiac coil was used. Functional imaging was done using Fiesta sequences. 2,3, and 4 chamber views were done to assess for RWMA's. Modified Simpson's rule using a short axis stack was used to calculate an ejection fraction on a dedicated work [employer] officer, trade [employer]. The patient received 10 cc of Gadavist . After 10 minutes inversion recovery sequences were used to assess for infiltration and scar tissue. Velocity flow mapping performed in the ascending aorta and main pulmonary artery. CONTRAST:  10 cc  of Gadavist  FINDINGS: 1. Normal left ventricular size, thickness and systolic function (LVEF = 56%). There are no regional wall motion abnormalities. There is no late gadolinium enhancement in the left ventricular myocardium. LVEDV: 54 ml LVESV: 24 ml SV: 30 ml CO: 2.5 L/min Myocardial mass: 57g LV native T1 value 1021 ms (normal <1000 ms) LV ECV value 29% (normal <30%) 2. Normal right ventricular  size, thickness and systolic function (RVEF = 52%). There are no regional wall motion abnormalities. 3. Moderately dilated left atrium, mildly dilated right atrial size. 4. Normal size of the aortic root, ascending aorta and pulmonary artery. 5. Mild to moderate mitral regurgitation. Aortic valve appears calcified with atleast moderate aortic valve stenosis visually. 6.  Normal pericardium.  small pericardial effusion. IMPRESSION: 1.  Normal LV size and systolic function.  LVEF 56%. 2.  No LGE or scar. 3.  Moderate aortic stenosis. 4.  Normal RV systolic function. 5.  No evidence for inflammatory disease. Electronically Signed   By: Constancia Delton  M.D.   On: 03/25/2024 23:37   ECHOCARDIOGRAM COMPLETE Result Date: 03/23/2024    ECHOCARDIOGRAM REPORT   Patient Name:   NATALIA WITTMEYER Date of Exam: 03/20/2024 Medical Rec #:  [id number]          Height:       60.0 in Accession #:    [id number]         Weight:       150.1 lb Date of Birth:  11/07/1945           BSA:          1.652 m Patient Age:    77 years           BP:           100/63 mmHg Patient Gender: F                  HR:           75 bpm. Exam Location:  [facility] Procedure: 2D Echo (Both Spectral and Color Flow Doppler were utilized during            procedure).                                MODIFIED REPORT:    This report was modified by Maudine Sos MD on 03/23/2024 due to aortic                                    stenosis.  Indications:     CHF I50.31  History:         Patient has prior history of Echocardiogram examinations, most                  recent 11/14/2021.  Sonographer:     Clenton Czech RDCS, FASE Referring Phys:  [id number] XILIN NIU Diagnosing Phys: Maudine Sos MD IMPRESSIONS  1. Intracavitary gradient. Peak velocity 2.31 m/s. Peak gradient 21.3 mmHg. Left ventricular ejection fraction, by estimation, is 60 to 65%. The left ventricle has normal function. The left ventricle has no regional wall motion abnormalities. There is moderate  concentric left ventricular hypertrophy.  2. Right ventricular systolic function is normal. The right ventricular size is normal. There is normal pulmonary artery systolic pressure.  3. Mostly lateral to the RV. There is also echogenic material in the pericardium lateral to the RV apex. This is visualized on prior echo as well. May represent fat pad. However, it may also represent evidence of prior hemopericardium. a small pericardial effusion is present. There is no evidence of cardiac tamponade.  4. Mobile MAC on the posterior mitral valve annulus. The mitral valve is normal in structure. Mild mitral valve regurgitation. Mild mitral stenosis. The mean mitral valve gradient is 3.5 mmHg with average heart rate of 79 bpm. Severe mitral annular calcification.  5. The aortic valve is normal in structure. There is severe calcifcation of the aortic valve. There is severe thickening of the aortic valve. Aortic valve regurgitation is not visualized. Mild aortic valve stenosis. Aortic valve area, by VTI measures 1.10 cm. Aortic valve mean gradient measures 13.8 mmHg. Aortic valve Vmax measures 2.57 m/s.  6. The inferior vena cava is normal in size with greater than 50% respiratory variability, suggesting right atrial pressure of 3 mmHg. FINDINGS  Left Ventricle: Intracavitary gradient. Peak velocity 2.31 m/s. Peak  gradient 21.3 mmHg. Left ventricular ejection fraction, by estimation, is 60 to 65%. The left ventricle has normal function. The left ventricle has no regional wall motion abnormalities. The left ventricular internal cavity size was normal in size. There is moderate concentric left ventricular hypertrophy. Indeterminate filling pressures. Right Ventricle: The right ventricular size is normal. No increase in right ventricular wall thickness. Right ventricular systolic function is normal. There is normal pulmonary artery systolic pressure. The tricuspid regurgitant velocity is 1.62 m/s, and  with an assumed right  atrial pressure of 3 mmHg, the estimated right ventricular systolic pressure is 13.5 mmHg. Left Atrium: Left atrial size was normal in size. Right Atrium: Right atrial size was normal in size. Pericardium: Mostly lateral to the RV. There is also echogenic material in the pericardium lateral to the RV apex. This is visualized on prior echo as well. May represent fat pad. However, it may also represent evidence of prior hemopericardium. A small pericardial effusion is present. There is no evidence of cardiac tamponade. Mitral Valve: Mobile MAC on the posterior mitral valve annulus. The mitral valve is normal in structure. Severe mitral annular calcification. Mild mitral valve regurgitation. Mild mitral valve stenosis. MV peak gradient, 11.7 mmHg. The mean mitral valve gradient is 3.5 mmHg with average heart rate of 79 bpm. Tricuspid Valve: The tricuspid valve is normal in structure. Tricuspid valve regurgitation is mild . No evidence of tricuspid stenosis. Aortic Valve: The aortic valve is normal in structure. There is severe calcifcation of the aortic valve. There is severe thickening of the aortic valve. Aortic valve regurgitation is not visualized. Mild aortic stenosis is present. Aortic valve mean gradient measures 13.8 mmHg. Aortic valve peak gradient measures 26.5 mmHg. Aortic valve area, by VTI measures 1.10 cm. Pulmonic Valve: The pulmonic valve was normal in structure. Pulmonic valve regurgitation is not visualized. No evidence of pulmonic stenosis. Aorta: The aortic root is normal in size and structure. Venous: The inferior vena cava is normal in size with greater than 50% respiratory variability, suggesting right atrial pressure of 3 mmHg. IAS/Shunts: No atrial level shunt detected by color flow Doppler.  LEFT VENTRICLE PLAX 2D LVIDd:         3.50 cm   Diastology LVIDs:         2.40 cm   LV e' medial:    10.70 cm/s LV PW:         1.40 cm   LV E/e' medial:  12.0 LV IVS:        1.30 cm   LV e' lateral:    10.60 cm/s LVOT diam:     1.80 cm   LV E/e' lateral: 12.1 LV SV:         46 LV SV Index:   28 LVOT Area:     2.54 cm  RIGHT VENTRICLE RV Basal diam:  2.40 cm RV S prime:     8.05 cm/s TAPSE (M-mode): 1.5 cm LEFT ATRIUM             Index        RIGHT ATRIUM           Index LA diam:        5.40 cm 3.27 cm/m   RA Area:     13.50 cm LA Vol (A2C):   23.6 ml 14.28 ml/m  RA Volume:   29.10 ml  17.61 ml/m LA Vol (A4C):   34.0 ml 20.58 ml/m LA Biplane Vol: 29.8 ml 18.03 ml/m  AORTIC  VALVE                     PULMONIC VALVE AV Area (Vmax):    0.98 cm      PV Vmax:       0.69 m/s AV Area (Vmean):   1.03 cm      PV Peak grad:  1.9 mmHg AV Area (VTI):     1.10 cm AV Vmax:           257.25 cm/s AV Vmean:          168.500 cm/s AV VTI:            0.420 m AV Peak Grad:      26.5 mmHg AV Mean Grad:      13.8 mmHg LVOT Vmax:         99.20 cm/s LVOT Vmean:        68.000 cm/s LVOT VTI:          0.182 m LVOT/AV VTI ratio: 0.43  AORTA Ao Root diam: 2.60 cm Ao Asc diam:  2.90 cm MITRAL VALVE                TRICUSPID VALVE MV Area (PHT): 3.26 cm     TR Peak grad:   10.5 mmHg MV Area VTI:   1.32 cm     TR Vmax:        162.00 cm/s MV Peak grad:  11.7 mmHg MV Mean grad:  3.5 mmHg     SHUNTS MV Vmax:       1.71 m/s     Systemic VTI:  0.18 m MV Vmean:      86.4 cm/s    Systemic Diam: 1.80 cm MV Decel Time: 233 msec MV E velocity: 128.00 cm/s Maudine Sos MD Electronically signed by Maudine Sos MD Signature Date/Time: 03/20/2024/11:55:45 AM    Final (Updated)    DG Chest Port 1 View Result Date: 03/22/2024 CLINICAL DATA:  PICC line placement EXAM: PORTABLE CHEST 1 VIEW COMPARISON:  03/19/2024 FINDINGS: A left PICC catheter has been placed with tip projecting over the cavoatrial junction region. No pneumothorax. Cardiac enlargement. Lungs are clear. No pleural effusion. Mediastinal contours appear intact. IMPRESSION: Left PICC catheter with tip projecting over the cavoatrial junction region. No pneumothorax. Cardiac  enlargement. No evidence of active pulmonary disease. Electronically Signed   By: Boyce Byes M.D.   On: 03/22/2024 18:05   US  EKG SITE RITE Result Date: 03/22/2024 If Site Rite image not attached, placement could not be confirmed due to current cardiac rhythm.  CARDIAC CATHETERIZATION Result Date: 03/22/2024 Successful right heart catheterization via the left basilic vein. Right heart catheterization showed severely elevated right atrial pressure, mild pulmonary hypertension, moderately elevated wedge pressure and severely reduced cardiac output. RA: 18 mmHg RV: 38/5 with an EDP of 22 mmHg PW: 26 mmHg PA: 38/22 with a mean of 30 mmHg PA sat was 55%. Cardiac output was 3.06 with an index of 1.85. Recommendations: The patient has evidence of biventricular failure with prominent RV dysfunction.  She likely has underlying cardiorenal syndrome and not able to diurese with some form of inotropic therapy.  Recommend starting small milrinone  drip and IV diuresis.  I discontinued Toprol  and midodrine .  Will consult advanced heart failure to help with management.   US  ASCITES (ABDOMEN LIMITED) Result Date: 03/20/2024 CLINICAL DATA:  [id number] Cirrhosis (HCC) [id number] EXAM: LIMITED ABDOMEN ULTRASOUND FOR ASCITES TECHNIQUE: Limited ultrasound survey for ascites was performed in all  four abdominal quadrants. COMPARISON:  August 01, 2021. FINDINGS: Targeted ultrasound performed of the 4 quadrants. No significant ascites is documented. Lobular and heterogeneous appearance of the liver consistent with history of cirrhosis. IMPRESSION: No significant ascites. Electronically Signed   By: Clancy Crimes M.D.   On: 03/20/2024 14:35   CT CHEST WO CONTRAST Result Date: 03/19/2024 CLINICAL DATA:  Respiratory illness shortness of breath EXAM: CT CHEST WITHOUT CONTRAST TECHNIQUE: Multidetector CT imaging of the chest was performed following the standard protocol without IV contrast. RADIATION DOSE REDUCTION: This exam was  performed according to the departmental dose-optimization program which includes automated exposure control, adjustment of the mA and/or kV according to patient size and/or use of iterative reconstruction technique. COMPARISON:  Chest x-ray 03/19/2024 FINDINGS: Cardiovascular: Limited evaluation without intravenous contrast. Mild aortic atherosclerosis. No aneurysm. Coronary vascular calcification. Borderline cardiomegaly. Mild pericardial thickening versus small volume fluid. Trace foci of gas in the right atrium presumably due to venous access. Mitral calcifications. Aortic valvular calcifications. Mediastinum/Nodes: Patent trachea. No suspicious thyroid  mass. Subcentimeter mediastinal lymph nodes. Esophagus shows mild distal circumferential thickening. Lungs/Pleura: No acute airspace disease, pleural effusion or pneumothorax Upper Abdomen: No acute finding.  Suspected liver cirrhosis Musculoskeletal: No acute or suspicious osseous abnormality. Multilevel degenerative changes IMPRESSION: 1. No CT evidence for acute intrathoracic abnormality. 2. Borderline cardiomegaly. Mild diffuse pericardial thickening versus small volume fluid. 3. Aortic valvular calcifications. 4. Mild distal circumferential thickening of the esophagus, question esophagitis or reflux. Nonemergent endoscopy evaluation suggested 5. 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 6. Aortic atherosclerosis. Aortic Atherosclerosis (ICD10-I70.0). Electronically Signed   By: Esmeralda Hedge M.D.   On: 03/19/2024 17:57   DG Chest 2 View Result Date: 03/19/2024 CLINICAL DATA:  Shortness of breath EXAM: CHEST - 2 VIEW COMPARISON:  X-ray 12/18/2021 FINDINGS: Hyperinflation. Normal cardiopericardial silhouette. Calcified aorta. No consolidation, pneumothorax or effusion. No edema. Degenerative changes seen along the spine on lateral view. IMPRESSION: Hyperinflation.  No acute cardiopulmonary disease. Electronically Signed   By: Adrianna Horde M.D.   On: 03/19/2024 12:09       Subjective: Seen and examined on day of discharge.  Appropriate for dc home with hospice services.  Discharge Exam: Vitals:   04/03/24 0002 04/03/24 0414  BP: (!) 100/58 93/63  Pulse: 83 85  Resp: 16 18  Temp: 97.9 F (36.6 C) (!) 97.3 F (36.3 C)  SpO2: 97% 94%   Vitals:   04/02/24 1136 04/02/24 1922 04/03/24 0002 04/03/24 0414  BP: 103/60 112/78 (!) 100/58 93/63  Pulse: 90 93 83 85  Resp: 18 18 16 18   Temp: 97.7 F (36.5 C) 97.7 F (36.5 C) 97.9 F (36.6 C) (!) 97.3 F (36.3 C)  TempSrc:   Oral   SpO2: 100% 98% 97% 94%  Weight:      Height:        General: Pt is alert, awake, not in acute distress Cardiovascular: RRR, S1/S2 +, no rubs, no gallops Respiratory: CTA bilaterally, no wheezing, no rhonchi Abdominal: Soft, NT, ND, bowel sounds + Extremities: no edema, no cyanosis    The results of significant diagnostics from this hospitalization (including imaging, microbiology, ancillary and laboratory) are listed below for reference.     Microbiology: No results found for this or any previous visit (from the past 240 hours).   Labs: BNP (last 3 results) Recent Labs    12/19/23 1624 03/19/24 1038  BNP 132.4* 265.4*   Basic Metabolic Panel: Recent Labs  Lab 03/29/24 0407 03/29/24 0902 03/30/24 0452 03/30/24 1536  03/31/24 0424 03/31/24 0910 03/31/24 1100 03/31/24 1502 03/31/24 2036 04/01/24 0256 04/01/24 0523 04/01/24 0816 04/01/24 1341 04/02/24 0125 04/02/24 0609  NA 118*   < > 119*   < > 123*   < > CANCELED BY LAB, QUESTIONABLE SPECIMEN INTEGRITY 125* 125*   < > 125* CANCELED BY LAB, QUESTIONABLE SPECIMEN INTEGRITY 123* 124* 125*  K 4.8   < > 4.6   < > 2.9*  --  CANCELED BY LAB, QUESTIONABLE SPECIMEN INTEGRITY 5.4* 3.3*  --  3.3*  --  5.1  --   --   CL 78*   < > 80*   < > 82*  --  CANCELED BY LAB, QUESTIONABLE SPECIMEN INTEGRITY 86*  --   --  83*  --  83*  --   --   CO2 26   < > 24   < > 26  --  CANCELED BY LAB, QUESTIONABLE SPECIMEN  INTEGRITY 27  --   --  28  --  26  --   --   GLUCOSE 149*   < > 204*   < > 97  --  CANCELED BY LAB, QUESTIONABLE SPECIMEN INTEGRITY 88  --   --  84  --  157*  --   --   BUN 95*   < > 97*   < > 97*  --  CANCELED BY LAB, QUESTIONABLE SPECIMEN INTEGRITY 94*  --   --  97*  --  95*  --   --   CREATININE 3.07*   < > 3.07*   < > 2.94*  --  CANCELED BY LAB, QUESTIONABLE SPECIMEN INTEGRITY 2.67*  --   --  2.49*  --  2.43*  --   --   CALCIUM  9.1   < > 8.8*   < > 8.8*  --  CANCELED BY LAB, QUESTIONABLE SPECIMEN INTEGRITY 8.8*  --   --  8.9  --  9.0  --   --   MG 3.6*  --  3.4*  --  3.1*  --   --   --   --   --  2.5*  --   --   --   --   PHOS 3.8  --   --   --  4.7*  --   --   --   --   --  4.4  --   --   --   --    < > = values in this interval not displayed.   Liver Function Tests: Recent Labs  Lab 03/28/24 0526  AST 21  ALT 16  ALKPHOS 88  BILITOT 2.6*  PROT 6.3*  ALBUMIN  3.4*   No results for input(s): LIPASE, AMYLASE in the last 168 hours. No results for input(s): AMMONIA in the last 168 hours. CBC: Recent Labs  Lab 03/28/24 0526 03/29/24 0407 03/29/24 1257 03/29/24 1302 03/29/24 1303 03/30/24 0452 03/31/24 0424 04/01/24 0523  WBC 8.4 8.3  --   --   --  7.7 6.2 5.1  HGB 11.9* 11.6*   < > 13.9 14.3 11.6* 11.5* 11.6*  HCT 38.5 36.8   < > 41.0 42.0 37.0 36.9 37.5  MCV 70.4* 68.9*  --   --   --  70.7* 70.6* 72.1*  PLT 341 358  --   --   --  362 283 268   < > = values in this interval not displayed.   Cardiac Enzymes: No results for input(s): CKTOTAL, CKMB, CKMBINDEX,  TROPONINI in the last 168 hours. BNP: Invalid input(s): POCBNP CBG: Recent Labs  Lab 04/01/24 2133 04/02/24 0850 04/02/24 1129 04/02/24 1608 04/02/24 2155  GLUCAP 225* 141* 220* 160* 118*   D-Dimer No results for input(s): DDIMER in the last 72 hours. Hgb A1c No results for input(s): HGBA1C in the last 72 hours. Lipid Profile No results for input(s): CHOL, HDL, LDLCALC, TRIG,  CHOLHDL, LDLDIRECT in the last 72 hours. Thyroid  function studies No results for input(s): TSH, T4TOTAL, T3FREE, THYROIDAB in the last 72 hours.  Invalid input(s): FREET3 Anemia work up No results for input(s): VITAMINB12, FOLATE, FERRITIN, TIBC, IRON , RETICCTPCT in the last 72 hours. Urinalysis    Component Value Date/Time   COLORURINE YELLOW (A) 04/11/2021 1215   APPEARANCEUR Clear 12/13/2022 1322   LABSPEC 1.011 04/11/2021 1215   PHURINE 5.0 04/11/2021 1215   GLUCOSEU 2+ (A) 12/13/2022 1322   HGBUR NEGATIVE 04/11/2021 1215   BILIRUBINUR Negative 12/13/2022 1322   KETONESUR NEGATIVE 04/11/2021 1215   PROTEINUR Negative 12/13/2022 1322   PROTEINUR NEGATIVE 04/11/2021 1215   NITRITE Negative 12/13/2022 1322   NITRITE NEGATIVE 04/11/2021 1215   LEUKOCYTESUR Negative 12/13/2022 1322   LEUKOCYTESUR MODERATE (A) 04/11/2021 1215   Sepsis Labs Recent Labs  Lab 03/29/24 0407 03/30/24 0452 03/31/24 0424 04/01/24 0523  WBC 8.3 7.7 6.2 5.1   Microbiology No results found for this or any previous visit (from the past 240 hours).   Time coordinating discharge: Over 30 minutes  SIGNED:   Tiajuana Fluke, MD  [facility] 04/03/2024, 7:58 AM Pager   If 7PM-7AM, please contact night-coverage

## 2024-04-03 NOTE — Progress Notes (Signed)
 [facility]  ROUNDING NOTE   Subjective:  Hospital day # 15    Patient known to our practice from outpatient follow-up of CKD.  She presented to the emergency room with worsening shortness of breath with exertion.  Normally she is able to drive and carry on activities of daily living with ease.  For the last 2 to 3 weeks and especially last 4 days she has been getting short of breath and having to rest even to go from 1 room to the next.  She has developed worsening lower extremity edema.  She is now presented to the hospital for further management.   Update Family at bedside. Patient with good resolve to plan for home today, decided on hospice at home. Appears to be breathing comfortably at the moment. No acute complaints.   Objective:  Vital signs in last 24 hours:  Temp:  [97.3 F (36.3 C)-97.9 F (36.6 C)] 97.5 F (36.4 C) (06/14 0804) Pulse Rate:  [83-93] 92 (06/14 0804) Resp:  [15-18] 15 (06/14 0804) BP: (93-112)/(58-78) 109/69 (06/14 0804) SpO2:  [94 %-100 %] 99 % (06/14 0804)  Weight change:  Filed Weights   03/31/24 0500 04/01/24 0500 04/02/24 0500  Weight: 69 kg 70.5 kg 71.2 kg    Intake/Output: I/O last 3 completed shifts: In: 100 [P.O.:100] Out: 1850 [Urine:1850]   Intake/Output this shift:  Total I/O In: -  Out: 450 [Urine:450]  Physical Exam: General: NAD,   Head: Normocephalic, atraumatic. Moist oral mucosal membranes  Eyes: Anicteric, PERRL  Neck: Supple, trachea midline  Lungs:  Clear to auscultation  Heart: Regular rate and rhythm  Abdomen:  Soft, nontender,   Extremities:  No peripheral edema.  Neurologic: Nonfocal, moving all four extremities  Skin: No lesions  Access: None    Basic Metabolic Panel: Recent Labs  Lab 03/29/24 0407 03/29/24 0902 03/30/24 0452 03/30/24 1536 03/31/24 0424 03/31/24 0910 03/31/24 1100 03/31/24 1502 03/31/24 2036 04/01/24 0256 04/01/24 0523 04/01/24 0816 04/01/24 1341 04/02/24 0125  04/02/24 0609  NA 118*   < > 119*   < > 123*   < > CANCELED BY LAB, QUESTIONABLE SPECIMEN INTEGRITY 125* 125*   < > 125* CANCELED BY LAB, QUESTIONABLE SPECIMEN INTEGRITY 123* 124* 125*  K 4.8   < > 4.6   < > 2.9*  --  CANCELED BY LAB, QUESTIONABLE SPECIMEN INTEGRITY 5.4* 3.3*  --  3.3*  --  5.1  --   --   CL 78*   < > 80*   < > 82*  --  CANCELED BY LAB, QUESTIONABLE SPECIMEN INTEGRITY 86*  --   --  83*  --  83*  --   --   CO2 26   < > 24   < > 26  --  CANCELED BY LAB, QUESTIONABLE SPECIMEN INTEGRITY 27  --   --  28  --  26  --   --   GLUCOSE 149*   < > 204*   < > 97  --  CANCELED BY LAB, QUESTIONABLE SPECIMEN INTEGRITY 88  --   --  84  --  157*  --   --   BUN 95*   < > 97*   < > 97*  --  CANCELED BY LAB, QUESTIONABLE SPECIMEN INTEGRITY 94*  --   --  97*  --  95*  --   --   CREATININE 3.07*   < > 3.07*   < > 2.94*  --  CANCELED  BY LAB, QUESTIONABLE SPECIMEN INTEGRITY 2.67*  --   --  2.49*  --  2.43*  --   --   CALCIUM  9.1   < > 8.8*   < > 8.8*  --  CANCELED BY LAB, QUESTIONABLE SPECIMEN INTEGRITY 8.8*  --   --  8.9  --  9.0  --   --   MG 3.6*  --  3.4*  --  3.1*  --   --   --   --   --  2.5*  --   --   --   --   PHOS 3.8  --   --   --  4.7*  --   --   --   --   --  4.4  --   --   --   --    < > = values in this interval not displayed.    Liver Function Tests: Recent Labs  Lab 03/28/24 0526  AST 21  ALT 16  ALKPHOS 88  BILITOT 2.6*  PROT 6.3*  ALBUMIN  3.4*   No results for input(s): LIPASE, AMYLASE in the last 168 hours. No results for input(s): AMMONIA in the last 168 hours.  CBC: Recent Labs  Lab 03/28/24 0526 03/29/24 0407 03/29/24 1257 03/29/24 1302 03/29/24 1303 03/30/24 0452 03/31/24 0424 04/01/24 0523  WBC 8.4 8.3  --   --   --  7.7 6.2 5.1  HGB 11.9* 11.6*   < > 13.9 14.3 11.6* 11.5* 11.6*  HCT 38.5 36.8   < > 41.0 42.0 37.0 36.9 37.5  MCV 70.4* 68.9*  --   --   --  70.7* 70.6* 72.1*  PLT 341 358  --   --   --  362 283 268   < > = values in this interval not  displayed.    Cardiac Enzymes: No results for input(s): CKTOTAL, CKMB, CKMBINDEX, TROPONINI in the last 168 hours.  BNP: Invalid input(s): POCBNP  CBG: Recent Labs  Lab 04/02/24 0850 04/02/24 1129 04/02/24 1608 04/02/24 2155 04/03/24 0806  GLUCAP 141* 220* 160* 118* 149*    Microbiology: Results for orders placed or performed during the hospital encounter of 03/19/24  MRSA Next Gen by PCR, Nasal     Status: None   Collection Time: 03/22/24 12:53 PM   Specimen: Nasal Mucosa; Nasal Swab  Result Value Ref Range Status   MRSA by PCR Next Gen NOT DETECTED NOT DETECTED Final    Comment: (NOTE) The GeneXpert MRSA Assay (FDA approved for NASAL specimens only), is one component of a comprehensive MRSA colonization surveillance program. It is not intended to diagnose MRSA infection nor to guide or monitor treatment for MRSA infections. Test performance is not FDA approved in patients less than 58 years old. Performed at [facility], [address] [place], [place] [address]     Coagulation Studies: No results for input(s): LABPROT, INR in the last 72 hours.  Urinalysis: No results for input(s): COLORURINE, LABSPEC, PHURINE, GLUCOSEU, HGBUR, BILIRUBINUR, KETONESUR, PROTEINUR, UROBILINOGEN, NITRITE, LEUKOCYTESUR in the last 72 hours.  Invalid input(s): APPERANCEUR    Imaging: No results found.   Medications:     allopurinol   100 mg Oral Daily   apixaban   5 mg Oral BID   Chlorhexidine  Gluconate Cloth  6 each Topical Daily   docusate sodium   100 mg Oral BID   feeding supplement  237 mL Oral BID BM   folic acid   1 mg Oral Daily  insulin  aspart  0-15 Units Subcutaneous TID WC   insulin  aspart  0-5 Units Subcutaneous QHS   insulin  aspart  3 Units Subcutaneous TID WC   insulin  glargine-yfgn  10 Units Subcutaneous BID   latanoprost   1 drop Both Eyes QHS   pantoprazole   40 mg Oral BID   polyethylene glycol  17 g Oral  Daily   rosuvastatin   5 mg Oral Weekly   senna  1 tablet Oral Daily   sodium chloride  flush  10-40 mL Intracatheter Q12H   acetaminophen , albuterol , bisacodyl , dextromethorphan-guaiFENesin , ondansetron  (ZOFRAN ) IV, mouth rinse, oxyCODONE , sodium chloride  flush, traMADol , traZODone   Assessment/ Plan:  Ms. BRYNLEIGH SEQUEIRA is a 78 y.o.  female with  multiple medical problems including chronic kidney disease, hypertension, atrial fibrillation, hyperlipidemia, chronic lower extremity edema, diastolic dysfunction, diabetes, osteoarthritis of the knee, chronic back pain  admitted on 03/19/2024 for Shortness of breath [R06.02] Acute on chronic diastolic heart failure (HCC) [I50.33] Acute on chronic diastolic CHF (congestive heart failure) (HCC) [I50.33]   1.  Acute on chronic diastolic CHF, with worsening lower extremity edema and shortness of breath 2.  Acute kidney injury on chronic kidney disease stage IV.  Baseline creatinine 2.6/GFR 19 from April 2025. 3.  Hypokalemia 4.  Hyponatremia. 5.  Diabetes type 2 with CKD.  Hemoglobin A1c 7.4% from February 2025. 6.  Constipation   2D echo 03/20/2024-LVEF 60 to 65%, moderate concentric LVH, normal right ventricular systolic function small pericardial effusion, normal aortic valve, severe mitral annular calcification   Plan: RHC on 6/2 showed severely decreased cardiac output, biventricular failure with RV dysfunction. Cardiology feels this is end stage diastolic heart failure. Milrinone  drip with IV Furosemide  was provided initially..  Repeat RHC with [facility] which showed moderately elevated biventricular filling pressures, constrictive disease and moderate pulmonary hypertension.   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 In regards to her underlying renal dysfunction most recent creatinine was 2.43 with an EGFR of 20. She is not currently on any diuretics. Serum sodium has been  trending up and currently 125.                                                                    Update:  Patient waiting to discharge home with hospice.     LOS: 15 Kathyrn Warmuth P Henry Loge 6/14/202511:14 AM

## 2024-04-03 NOTE — Progress Notes (Incomplete)
 Patient left unit on stretcher in care of

## 2024-04-03 NOTE — Progress Notes (Signed)
 Patient husband at bedside. She is ready, PICC line was removed. Belonging packed. Will go home with foley. [facility] is here.

## 2024-04-06 ENCOUNTER — Telehealth: Payer: Self-pay

## 2024-04-06 NOTE — Transitions of Care (Post Inpatient/ED Visit) (Signed)
   04/06/2024  Name: JOBY HERSHKOWITZ MRN: [id number] DOB: [birth year]  Today's TOC FU Call Status: Today's TOC FU Call Status:: Unsuccessful Call (1st Attempt) Unsuccessful Call (1st Attempt) Date: 04/06/24  Attempted to reach the patient regarding the most recent Inpatient/ED visit.  Follow Up Plan: Additional outreach attempts will be made to reach the patient to complete the Transitions of Care (Post Inpatient/ED visit) call.

## 2024-04-06 NOTE — Telephone Encounter (Unsigned)
 Copied from CRM [id number]. Topic: Clinical - Medical Advice >> Apr 06, 2024 10:44 AM Elle L wrote: Reason for CRM: The patient was returning a call from Lumpkins, Jilda Most, CMA regarding her most recent emergency room visit. I reached out to the office who advised CMA Dana Duncan will reach back out to her.   The patient advised is now under hospice care, has a Child psychotherapist, and a Engineer, civil (consulting). The patient states that the hospital took her off of her Dexcom and she is requesting to see if she can continue using it as she does not like doing the finger pricks. She does have Dexcoms at home at this time. The patient is unable to check her blood sugar at this time due to not using the Dexcom but denied having any symptoms at this time. The patient's call back number is [phone].

## 2024-04-13 ENCOUNTER — Ambulatory Visit

## 2024-05-14 ENCOUNTER — Telehealth: Payer: Self-pay | Admitting: Cardiovascular Disease

## 2024-05-14 NOTE — Telephone Encounter (Signed)
 Dr Perla  Patient had appointment with you for 05/25/24 but she has cancelled due to being put on hospice. She wanted you to know she wanted to be able to come see.

## 2024-05-20 NOTE — Telephone Encounter (Signed)
 Called patient back. Per Dr Perla: She is followed by [facility], we should schedule her with them as they manage her CHF  Patient was given the number to advanced [facility] clinic and told to call her doctor there. Patient verbalized understanding. No further questions at his time.

## 2024-05-20 NOTE — Telephone Encounter (Signed)
 Patient called back to get appt but the soonest we had was in October. Patient  stated that dr wanted her to be seeing sooner. Please advise

## 2024-05-21 ENCOUNTER — Telehealth: Payer: Self-pay | Admitting: Family

## 2024-05-21 NOTE — Telephone Encounter (Signed)
 Called to confirm/remind patient of their appointment at the [facility] on 05/24/24.   Appointment:   [x] Confirmed  [] Left mess   [] No answer/No voice mail  [] VM Full/unable to leave message  [] Phone not in service  Patient reminded to bring all medications and/or complete list.  Confirmed patient has transportation. Gave directions, instructed to utilize valet parking.

## 2024-05-24 ENCOUNTER — Ambulatory Visit: Attending: Cardiology | Admitting: Adult Health

## 2024-05-24 VITALS — BP 98/74 | HR 78 | Wt 168.0 lb

## 2024-05-24 DIAGNOSIS — Z833 Family history of diabetes mellitus: Secondary | ICD-10-CM | POA: Diagnosis not present

## 2024-05-24 DIAGNOSIS — Z7984 Long term (current) use of oral hypoglycemic drugs: Secondary | ICD-10-CM | POA: Diagnosis not present

## 2024-05-24 DIAGNOSIS — I5082 Biventricular heart failure: Secondary | ICD-10-CM | POA: Diagnosis present

## 2024-05-24 DIAGNOSIS — N184 Chronic kidney disease, stage 4 (severe): Secondary | ICD-10-CM

## 2024-05-24 DIAGNOSIS — Z79899 Other long term (current) drug therapy: Secondary | ICD-10-CM | POA: Insufficient documentation

## 2024-05-24 DIAGNOSIS — Z8249 Family history of ischemic heart disease and other diseases of the circulatory system: Secondary | ICD-10-CM | POA: Insufficient documentation

## 2024-05-24 DIAGNOSIS — Z794 Long term (current) use of insulin: Secondary | ICD-10-CM | POA: Diagnosis not present

## 2024-05-24 DIAGNOSIS — I959 Hypotension, unspecified: Secondary | ICD-10-CM | POA: Insufficient documentation

## 2024-05-24 DIAGNOSIS — I130 Hypertensive heart and chronic kidney disease with heart failure and stage 1 through stage 4 chronic kidney disease, or unspecified chronic kidney disease: Secondary | ICD-10-CM | POA: Diagnosis not present

## 2024-05-24 DIAGNOSIS — Z7901 Long term (current) use of anticoagulants: Secondary | ICD-10-CM | POA: Insufficient documentation

## 2024-05-24 DIAGNOSIS — E785 Hyperlipidemia, unspecified: Secondary | ICD-10-CM | POA: Diagnosis not present

## 2024-05-24 DIAGNOSIS — E1022 Type 1 diabetes mellitus with diabetic chronic kidney disease: Secondary | ICD-10-CM | POA: Insufficient documentation

## 2024-05-24 DIAGNOSIS — I4821 Permanent atrial fibrillation: Secondary | ICD-10-CM

## 2024-05-24 MED ORDER — TORSEMIDE 20 MG PO TABS
ORAL_TABLET | ORAL | 1 refills | Status: DC
Start: 2024-05-24 — End: 2024-08-23

## 2024-05-24 NOTE — Patient Instructions (Signed)
 Medication Changes:  CHANGE how you take Torsemide : Take 2 (two) 20 mg tablets for a total of 40 mg daily.    Special Instructions // Education:  Do the following things EVERYDAY: Weigh yourself in the morning before breakfast. Write it down and keep it in a log. Take your medicines as prescribed Eat low salt foods--Limit salt (sodium) to 2000 mg per day.  Stay as active as you can everyday Limit all fluids for the day to less than 2 liters   Follow-Up in: Dr Zenaida in September    If you have any questions or concerns before your next appointment please send us  a message through [place] or call our office at [phone], If it is after office hours your call will be answered by our answering service and directed appropriately.     At the [facility], you and your health needs are our priority. We have a designated team specialized in the treatment of Heart Failure. This Care Team includes your primary Heart Failure Specialized Cardiologist (physician), Advanced Practice Providers (APPs- Physician Assistants and Nurse Practitioners), and Pharmacist who all work together to provide you with the care you need, when you need it.   You may see any of the following providers on your designated Care Team at your next follow up:  Dr. Toribio Fuel Dr. Ezra Shuck Dr. Ria Commander Dr. Odis Zenaida Greig Mosses, NP Caffie Shed, [place] [address] [place], [place] Beckey Coe, NP [place] Lee, NP Ellouise Class, NP Jaun Bash, PharmD

## 2024-05-24 NOTE — Progress Notes (Signed)
 Patient ID: Karen Farley, female    DOB: May 30, 1946, 78 y.o.   MRN: [id number] NeHeart Failure: Dr Zenaida  Chief Complaint: Leg Edema   HPI Karen Farley is a 78 y/o female with a history of DM, hyperlipidemia, HTN, CKD Stage IIIb, DDD, GIB, lymphedema, permanent atrial fibrillation, pleural effusion and chronic biventricular heart failure.   Admitted 03/19/2024 with A/C Biventricular HF due to constriction. EF 60-65% with mod-severe AS normal RV and evidence of septal interdependence, bright pericardium.  RHC 03/22/24: RA 18, PA 38/22 (30), PCW 26, CO/CI 3.06/1.85, PAPi 0.89. Placed on pressors and inotropes. She was not a candidate for surgery due to debility and renal dysfunction. Discharged 04/03/24 with Hospice.   Today she is here with her husband. She tells me she wants to try and get some fluid off. She uses a walker at home for short distances. Able to take a few steps. She remains extremely limited. She has a hospital bed in the living room. Remains SOB with exertion. Denies PND/Orthopnea.  Increased lower extremity edema. Appetite ok. No fever or chills. Weight at home trending down 176-->166 pounds. Taking all medications. Hospice follows her with an Aide 3 days a week with a nurse once a week.   Past Medical History:  Diagnosis Date   A-fib [facility])    CHF (congestive heart failure) (HCC)    Chronic heart failure with preserved ejection fraction (HFpEF) (HCC)    a. 11/2019 Echo: EF 60-65%; b. 07/2021 Echo: EF 60-65%.   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 07/2021 req 2u prbcs. Eval deferred given resolution of bleeding and cardiac issues.   Hyperlipidemia    Hypertension    Lymphedema    Morbid obesity (HCC)    Osteoarthritis of both knees    Osteopenia    Pericardial  effusion    a. 07/2021 Echo: Nl EF. Large pericardial effusion-->s/p pericardiocentesis of ; b. 08/23/2021 Echo: EF 60-65%, mild LVH, Nl RV fxn, sev dil LA, moderate pericardial effusion. L pleural effusion.   Permanent atrial fibrillation (HCC)    Pleural effusion, left    a. 07/2021 s/p thoracentesis - .   Past Surgical History:  Procedure Laterality Date   BASAL CELL CARCINOMA EXCISION  11/01/2022   CATARACT EXTRACTION W/PHACO Left 10/09/2021   Procedure: CATARACT EXTRACTION PHACO AND INTRAOCULAR LENS PLACEMENT (IOC) LEFT DIABETIC 4.84 00:37.8;  Surgeon: Jaye Fallow, MD;  Location: [facility];  Service: Ophthalmology;  Laterality: Left;  Diabetic   CATARACT EXTRACTION W/PHACO Right 10/30/2021   Procedure: CATARACT EXTRACTION PHACO AND INTRAOCULAR LENS PLACEMENT (IOC) RIGHT DIABETIC 5.65 00:38.5;  Surgeon: Jaye Fallow, MD;  Location: [facility];  Service: Ophthalmology;  Laterality: Right;  Diabetic   CHOLECYSTECTOMY     DILATION AND CURETTAGE OF UTERUS     IR FLUORO GUIDE CV LINE RIGHT  08/16/2021   IR THORACENTESIS ASP PLEURAL SPACE W/IMG GUIDE  08/16/2021   PERICARDIOCENTESIS N/A 08/05/2021   Procedure: PERICARDIOCENTESIS;  Surgeon: Ammon Blunt, MD;  Location: [facility] INVASIVE CV LAB;  Service: Cardiovascular;  Laterality: N/A;   RIGHT AND LEFT HEART CATH N/A 03/29/2024   Procedure: RIGHT AND LEFT HEART CATH;  Surgeon: Zenaida Morene PARAS, MD;  Location: [facility] INVAS[facility] CV LAB;  Service: Cardiovascular;  Laterality: N/A;   RIGHT HEART CATH N/A 03/22/2024   Procedure: RIGHT HEART CATH;  Surgeon: Darron Deatrice LABOR, MD;  Location: [facility] INVASIVE CV LAB;  Service: Cardiovascular;  Laterality: N/A;   TEAR DUCT PROBING WITH STRABISMUS REPAIR Right    TONSILLECTOMY     Family History  Problem Relation Age of Onset   Heart disease Mother        CHF   Osteoporosis Mother    Breast cancer Mother    Heart disease Father 33   Cancer Brother        Colon  CA- 2002- Youngest brother   Parkinson's disease Brother        Younger Brother   Heart disease Brother        2 brothers   Diabetes Brother    Cancer Brother    Breast cancer Maternal Grandmother    Social History   Tobacco Use   Smoking status: Never   Smokeless tobacco: Never  Substance Use Topics   Alcohol use: Not Currently    Alcohol/week: 0.0 standard drinks of alcohol    Comment: on rare occasion   No Known Allergies  Prior to Admission medications   Medication Sig Start Date End Date Taking? Authorizing Provider  ACCU-CHEK GUIDE test strip USE TO CHECK BLOOD SUGAR DAILY 09/30/21  Yes [provider]  apixaban  (ELIQUIS ) 5 MG TABS tablet Take 1 tablet (5 mg total) by mouth 2 (two) times daily. 05/22/22  Yes Valerio Moris T, NP  Cholecalciferol  125 MCG (5000 UT) TABS Take by mouth. 12/13/21 12/13/22 Yes [provider]  Continuous Glucose Monitor Sup KIT 1 each by Does not apply route continuous. 03/01/22  Yes Johnson, Duwaine SQUIBB, DO  Elastic Bandages & Supports (MEDICAL COMPRESSION STOCKINGS) MISC by Does not apply route.   Yes Johnson, [name], DO  empagliflozin  (JARDIANCE ) 25 MG TABS tablet Take 1 tablet (25 mg total) by mouth daily before breakfast. 02/14/22  Yes Johnson, [name], DO  folic acid  (FOLVITE ) 1 MG tablet Take 1 tablet (1 mg total) by mouth daily. 09/04/21  Yes Johnson, [name], DO  insulin  glargine, 2 Unit Dial, (TOUJEO  MAX SOLOSTAR) 300 UNIT/ML Solostar Pen Inject 48 Units into the skin at bedtime. 05/20/22 06/19/22 Yes Johnson, [name], DO  Insulin  Pen Needle (PEN NEEDLES) 30G X 8 MM MISC 1 each by Does not apply route daily. 09/11/21  Yes Johnson, [name], DO  iron  polysaccharides (NIFEREX) 150 MG capsule TAKE 1 CAPSULE(150 MG) BY MOUTH DAILY 02/14/22  Yes Johnson, [name], DO  latanoprost  (XALATAN ) 0.005 % ophthalmic solution Place 1 drop into both eyes at bedtime.   Yes [provider]  metolazone  (ZAROXOLYN ) 2.5 MG tablet Take 1 tablet  (2.5 mg total) by mouth every Monday, Wednesday, and Friday. 04/19/22  Yes Gollan, Timothy J, MD  metoprolol  succinate (TOPROL -XL) 25 MG 24 hr tablet Take 0.5 tablets (12.5 mg total) by mouth daily. 11/19/21  Yes Johnson, [name], DO  midodrine  (PROAMATINE ) 10 MG tablet Take 1 tablet (10 mg total) by mouth 3 (three) times daily with meals. 05/22/22  Yes Cannady, Jolene T, NP  Multiple Vitamin (MULTIVITAMIN WITH MINERALS) TABS tablet Take 1 tablet by mouth daily.   Yes [provider]  Niacin (VITAMIN B-3 PO) Take 5,000 Units by mouth once a week.   Yes [provider]  pantoprazole  (PROTONIX ) 40 MG tablet Take 1 tablet (40 mg total) by mouth 2 (two) times daily. 09/04/21  Yes Vicci,  [name], DO  polyethylene glycol (MIRALAX  / GLYCOLAX ) 17 g packet Take 17 g by mouth daily. 08/28/21  Yes Patel, Sona, MD  potassium chloride  SA (KLOR-CON ) 20 MEQ tablet Take 2 tablets (40 mEq total) by mouth daily. 09/11/21  Yes Vivienne Lonni Ingle, NP  spironolactone  (ALDACTONE ) 25 MG tablet TAKE 1 TABLET(25 MG) BY MOUTH DAILY 05/27/22  Yes Johnson, [name], DO  torsemide  (DEMADEX ) 20 MG tablet TAKE 1 TO 2 TABLETS BY MOUTH DAILY. ONLY TAKE 2 TABLETS FOR 1 TO 3 DAYS AND IF PERSISTS, CALL WITH BAD SWELLING 10/25/21  Yes Johnson, [name], DO  vitamin B-12 (CYANOCOBALAMIN ) 500 MCG tablet Take 1 tablet (500 mcg total) by mouth daily. 09/04/21  Yes Johnson, [name], DO    Vitals:   05/24/24 1145  BP: 98/74  Pulse: 78  SpO2: 97%  Weight: 168 lb (76.2 kg)   Wt Readings from Last 3 Encounters:  05/24/24 168 lb (76.2 kg)  04/02/24 156 lb 15.5 oz (71.2 kg)  02/24/24 157 lb (71.2 kg)   Lab Results  Component Value Date   CREATININE 2.43 (H) 04/01/2024   CREATININE 2.49 (H) 04/01/2024   CREATININE 2.67 (H) 03/31/2024   General:   Arrived in a wheel chair. No resp difficulty Neck 11-12 Cor: Regular rate & rhythm. Lungs: clear Abdomen: soft, nontender, nondistended.  Extremities: R and LLE 2+   edema Neuro: alert & oriented x3 GU: foley  Assessment & Plan:  1. Chronic Biventricular HF due to constriction EF 60-65% with mod-severe AS normal RV on echo read, upon further review there is evidence of septal interdependence, bright pericardium . During recent hospitalization she required pressors and inotropes. Had RHC 03/22/24: RA 18, PA 38/22 (30), PCW 26, CO/CI 3.06/1.85, PAPi 0.89 with evidence of Biventricular HF.  Limited options due to renal dysfunction and poor mobility.  NYHA III.Marked volume overload. Increase torsemide  40 mg and continue metolazone .  Continue spironolactone  25 mg daily.   Not sure how much we can do to help with congestion. We discussed that she has limited options and I agree with ongoing Hospice Support. She would like to meet with Dr Zenaida to see if he has any other options.   2. Hypotension Now SBP ~ 100 . Continue midodrine  10 mg tid.   3.  Atrial fibrillation, Permanent Rate controlled. Continue Toprol  XL for rate control.  Continue eliquis  5 mg twice a day    4. CKD Stage IV Creatinine baseline ~ 2.5-2.6  Followed by Nephrology    Follow up in 4-6 weeks with Dr Zenaida.   Killian Ress NP-C  4:31 PM

## 2024-05-25 ENCOUNTER — Ambulatory Visit: Admitting: Cardiovascular Disease

## 2024-05-28 ENCOUNTER — Telehealth: Payer: Self-pay | Admitting: Family Medicine

## 2024-05-28 NOTE — Telephone Encounter (Signed)
**Note De-identified  [place] Obfuscation** Please advise 

## 2024-05-28 NOTE — Telephone Encounter (Signed)
 Copied from CRM [id number]. Topic: Appointments - Appointment Scheduling >> May 28, 2024 12:17 PM Turkey B wrote: Alfonso from [facility] called in, trying to schedule ppt for pt that want to be released from hospice, since she is doing better, appt with dr Naomi derry until Sept 29. She needs sooner appt. Please cb for further assistance

## 2024-05-30 NOTE — Telephone Encounter (Signed)
 Please schedule sooner appt. OK to use same day

## 2024-05-31 NOTE — Telephone Encounter (Signed)
 Appt scheduled

## 2024-06-07 DIAGNOSIS — Z0100 Encounter for examination of eyes and vision without abnormal findings: Secondary | ICD-10-CM | POA: Diagnosis not present

## 2024-06-07 DIAGNOSIS — M3501 Sicca syndrome with keratoconjunctivitis: Secondary | ICD-10-CM | POA: Diagnosis not present

## 2024-06-07 DIAGNOSIS — H26493 Other secondary cataract, bilateral: Secondary | ICD-10-CM | POA: Diagnosis not present

## 2024-06-07 DIAGNOSIS — H18599 Other hereditary corneal dystrophies, unspecified eye: Secondary | ICD-10-CM | POA: Diagnosis not present

## 2024-06-07 DIAGNOSIS — H40053 Ocular hypertension, bilateral: Secondary | ICD-10-CM | POA: Diagnosis not present

## 2024-06-11 ENCOUNTER — Telehealth: Payer: Self-pay

## 2024-06-11 NOTE — Telephone Encounter (Signed)
 Copied from CRM [id number]. Topic: Referral - Question >> Jun 11, 2024 10:38 AM Ivette P wrote: Reason for CRM: PT is requesting for a ear throat and nose referral and woul dlike to know if it can be put in, would appt be needed.   Pls fllw up pt

## 2024-06-14 NOTE — Telephone Encounter (Signed)
 I do not, but what would she like to go for?

## 2024-06-18 ENCOUNTER — Telehealth (INDEPENDENT_AMBULATORY_CARE_PROVIDER_SITE_OTHER): Payer: Self-pay

## 2024-06-18 NOTE — Telephone Encounter (Signed)
 LVM with Dr. Aureliano office for a return call regarding patient. When Rebekah called to patient to schedule emergent referral appt, Pleasant states that patient seemed very confused and actually stated those words. Patient stated that maybe Dr. Aureliano office could help. I did LVM with Dr. Aureliano office for a return call explaining that due to our full schedule, they may want to consider sending patient to ED for cath placement and to discuss the confusion from patient.

## 2024-06-22 ENCOUNTER — Telehealth: Payer: Self-pay | Admitting: Cardiology

## 2024-06-22 NOTE — Telephone Encounter (Signed)
 Called to confirm/remind patient of their appointment at the [facility] on 06/23/24.   Appointment:   [] Confirmed  [x] Left mess   [] No answer/No voice mail  [] VM Full/unable to leave message  [] Phone not in service  Patient reminded to bring all medications and/or complete list.  Confirmed patient has transportation. Gave directions, instructed to utilize valet parking.

## 2024-06-23 ENCOUNTER — Ambulatory Visit: Admitting: Cardiology

## 2024-06-23 VITALS — BP 112/68 | HR 78 | Wt 143.0 lb

## 2024-06-23 DIAGNOSIS — W010XXA Fall on same level from slipping, tripping and stumbling without subsequent striking against object, initial encounter: Secondary | ICD-10-CM | POA: Diagnosis present

## 2024-06-23 DIAGNOSIS — I130 Hypertensive heart and chronic kidney disease with heart failure and stage 1 through stage 4 chronic kidney disease, or unspecified chronic kidney disease: Secondary | ICD-10-CM | POA: Diagnosis present

## 2024-06-23 DIAGNOSIS — Z833 Family history of diabetes mellitus: Secondary | ICD-10-CM | POA: Diagnosis not present

## 2024-06-23 DIAGNOSIS — I959 Hypotension, unspecified: Secondary | ICD-10-CM | POA: Insufficient documentation

## 2024-06-23 DIAGNOSIS — R262 Difficulty in walking, not elsewhere classified: Secondary | ICD-10-CM | POA: Diagnosis present

## 2024-06-23 DIAGNOSIS — S8265XA Nondisplaced fracture of lateral malleolus of left fibula, initial encounter for closed fracture: Secondary | ICD-10-CM | POA: Diagnosis present

## 2024-06-23 DIAGNOSIS — I311 Chronic constrictive pericarditis: Secondary | ICD-10-CM | POA: Diagnosis not present

## 2024-06-23 DIAGNOSIS — Z8249 Family history of ischemic heart disease and other diseases of the circulatory system: Secondary | ICD-10-CM | POA: Diagnosis not present

## 2024-06-23 DIAGNOSIS — Z800 Family history of malignant neoplasm of digestive organs: Secondary | ICD-10-CM | POA: Diagnosis not present

## 2024-06-23 DIAGNOSIS — I270 Primary pulmonary hypertension: Secondary | ICD-10-CM | POA: Diagnosis not present

## 2024-06-23 DIAGNOSIS — E785 Hyperlipidemia, unspecified: Secondary | ICD-10-CM | POA: Diagnosis present

## 2024-06-23 DIAGNOSIS — I5033 Acute on chronic diastolic (congestive) heart failure: Secondary | ICD-10-CM

## 2024-06-23 DIAGNOSIS — I4821 Permanent atrial fibrillation: Secondary | ICD-10-CM | POA: Insufficient documentation

## 2024-06-23 DIAGNOSIS — N184 Chronic kidney disease, stage 4 (severe): Secondary | ICD-10-CM | POA: Diagnosis present

## 2024-06-23 DIAGNOSIS — E871 Hypo-osmolality and hyponatremia: Secondary | ICD-10-CM | POA: Diagnosis present

## 2024-06-23 DIAGNOSIS — Z85828 Personal history of other malignant neoplasm of skin: Secondary | ICD-10-CM | POA: Diagnosis not present

## 2024-06-23 DIAGNOSIS — E876 Hypokalemia: Secondary | ICD-10-CM | POA: Diagnosis present

## 2024-06-23 DIAGNOSIS — E1122 Type 2 diabetes mellitus with diabetic chronic kidney disease: Secondary | ICD-10-CM | POA: Insufficient documentation

## 2024-06-23 DIAGNOSIS — E1165 Type 2 diabetes mellitus with hyperglycemia: Secondary | ICD-10-CM | POA: Diagnosis present

## 2024-06-23 DIAGNOSIS — I890 Lymphedema, not elsewhere classified: Secondary | ICD-10-CM | POA: Diagnosis present

## 2024-06-23 DIAGNOSIS — I9589 Other hypotension: Secondary | ICD-10-CM | POA: Diagnosis present

## 2024-06-23 DIAGNOSIS — Z820 Family history of epilepsy and other diseases of the nervous system: Secondary | ICD-10-CM | POA: Diagnosis not present

## 2024-06-23 DIAGNOSIS — Z79899 Other long term (current) drug therapy: Secondary | ICD-10-CM | POA: Diagnosis not present

## 2024-06-23 DIAGNOSIS — I5032 Chronic diastolic (congestive) heart failure: Secondary | ICD-10-CM | POA: Insufficient documentation

## 2024-06-23 DIAGNOSIS — Z8262 Family history of osteoporosis: Secondary | ICD-10-CM | POA: Diagnosis not present

## 2024-06-23 DIAGNOSIS — Z803 Family history of malignant neoplasm of breast: Secondary | ICD-10-CM | POA: Diagnosis not present

## 2024-06-23 NOTE — Progress Notes (Unsigned)
   ADVANCED HEART FAILURE FOLLOW UP CLINIC NOTE  Referring Physician: Vicci Duwaine SQUIBB, DO  Primary Care: Vicci Duwaine SQUIBB, DO Primary Cardiologist:  HPI: Karen Farley is a 78 y.o. 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      Admitted 03/19/2024 with A/C Biventricular HF due to constriction. EF 60-65% with mod-severe AS normal RV and evidence of septal interdependence, bright pericardium.  RHC 03/22/24: RA 18, PA 38/22 (30), PCW 26, CO/CI 3.06/1.85, PAPi 0.89. Placed on pressors and inotropes. She was not a candidate for surgery due to debility and renal dysfunction. Discharged 04/03/24 with Hospice.      SUBJECTIVE:  Patient reports that she recently left hospice due to inability to see her physicians.  She has remained exceedingly weak since discharge, though was able to get up and go to the bathroom at home.  Her appetite has worsened recently and she reports that her lower extremities feel a swollen.  She denies significant shortness of breath and has actually been losing weight recently.  She has been following with nephrology who is considering a short trial run of dialysis for her.  PMH, current medications, allergies, social history, and family history reviewed in epic.  PHYSICAL EXAM: Vitals:   06/23/24 1211  BP: 112/68  Pulse: 78   GENERAL: Chronically ill-appearing PULM:  Normal work of breathing, clear to auscultation bilaterally. Respirations are unlabored.  CARDIAC:  JVP: Moderately elevated        Systolic murmur, 2+ lower extremity edema.  Irregular rate and rhythm ABDOMEN: Soft, non-tender, non-distended. NEUROLOGIC: Patient is oriented x3 with no focal or lateralizing neurologic deficits.   ASSESSMENT & PLAN:  Constrictive pericarditis:  CKD stage IV:  Hypotension:  Atrial fibrillation:  Follow up in ***  Morene Brownie,  MD Advanced Heart Failure Mechanical Circulatory Support 06/23/24

## 2024-06-23 NOTE — Patient Instructions (Signed)
 Medication Changes:  No medication changes today!   Follow-Up in: Please follow up with the [facility] in 3 months. We do not currently have that schedule. Please give us  a call in November in order to schedule your appointment for December.   Thank you for choosing [facility].    At the [facility], you and your health needs are our priority. We have a designated team specialized in the treatment of Heart Failure. This Care Team includes your primary Heart Failure Specialized Cardiologist (physician), Advanced Practice Providers (APPs- Physician Assistants and Nurse Practitioners), and Pharmacist who all work together to provide you with the care you need, when you need it.   You may see any of the following providers on your designated Care Team at your next follow up:  Dr. Toribio Fuel Dr. Ezra Shuck Dr. Ria Commander Dr. Morene Brownie Ellouise Class, FNP Jaun Bash, RPH-CPP  Please be sure to bring in all your medications bottles to every appointment.   Need to Contact Us :  If you have any questions or concerns before your next appointment please send us  a message through [facility] or call our office at [phone].    TO LEAVE A MESSAGE FOR THE NURSE SELECT OPTION 2, PLEASE LEAVE A MESSAGE INCLUDING: YOUR NAME DATE OF BIRTH CALL BACK NUMBER REASON FOR CALL**this is important as we prioritize the call backs  YOU WILL RECEIVE A CALL BACK THE SAME DAY AS LONG AS YOU CALL BEFORE 4:00 PM

## 2024-06-25 ENCOUNTER — Other Ambulatory Visit: Payer: Self-pay

## 2024-06-25 ENCOUNTER — Emergency Department

## 2024-06-25 ENCOUNTER — Inpatient Hospital Stay
Admission: EM | Admit: 2024-06-25 | Discharge: 2024-06-30 | DRG: 562 | Disposition: A | Discharge status: Skilled Nursing Facility | Attending: Internal Medicine | Admitting: Internal Medicine

## 2024-06-25 ENCOUNTER — Encounter: Payer: Self-pay | Admitting: *Deleted

## 2024-06-25 DIAGNOSIS — R262 Difficulty in walking, not elsewhere classified: Secondary | ICD-10-CM | POA: Diagnosis present

## 2024-06-25 DIAGNOSIS — N184 Chronic kidney disease, stage 4 (severe): Secondary | ICD-10-CM | POA: Diagnosis present

## 2024-06-25 DIAGNOSIS — Z800 Family history of malignant neoplasm of digestive organs: Secondary | ICD-10-CM

## 2024-06-25 DIAGNOSIS — E1165 Type 2 diabetes mellitus with hyperglycemia: Secondary | ICD-10-CM | POA: Diagnosis present

## 2024-06-25 DIAGNOSIS — E871 Hypo-osmolality and hyponatremia: Secondary | ICD-10-CM | POA: Diagnosis present

## 2024-06-25 DIAGNOSIS — Z8249 Family history of ischemic heart disease and other diseases of the circulatory system: Secondary | ICD-10-CM

## 2024-06-25 DIAGNOSIS — Z803 Family history of malignant neoplasm of breast: Secondary | ICD-10-CM | POA: Diagnosis not present

## 2024-06-25 DIAGNOSIS — W010XXA Fall on same level from slipping, tripping and stumbling without subsequent striking against object, initial encounter: Secondary | ICD-10-CM | POA: Diagnosis present

## 2024-06-25 DIAGNOSIS — Z85828 Personal history of other malignant neoplasm of skin: Secondary | ICD-10-CM

## 2024-06-25 DIAGNOSIS — S8265XA Nondisplaced fracture of lateral malleolus of left fibula, initial encounter for closed fracture: Secondary | ICD-10-CM | POA: Diagnosis present

## 2024-06-25 DIAGNOSIS — E785 Hyperlipidemia, unspecified: Secondary | ICD-10-CM | POA: Diagnosis present

## 2024-06-25 DIAGNOSIS — I890 Lymphedema, not elsewhere classified: Secondary | ICD-10-CM | POA: Diagnosis present

## 2024-06-25 DIAGNOSIS — Z01818 Encounter for other preprocedural examination: Secondary | ICD-10-CM

## 2024-06-25 DIAGNOSIS — I4821 Permanent atrial fibrillation: Secondary | ICD-10-CM | POA: Diagnosis present

## 2024-06-25 DIAGNOSIS — Z833 Family history of diabetes mellitus: Secondary | ICD-10-CM

## 2024-06-25 DIAGNOSIS — E876 Hypokalemia: Secondary | ICD-10-CM | POA: Diagnosis present

## 2024-06-25 DIAGNOSIS — Z79899 Other long term (current) drug therapy: Secondary | ICD-10-CM

## 2024-06-25 DIAGNOSIS — S82899A Other fracture of unspecified lower leg, initial encounter for closed fracture: Secondary | ICD-10-CM | POA: Insufficient documentation

## 2024-06-25 DIAGNOSIS — I5033 Acute on chronic diastolic (congestive) heart failure: Secondary | ICD-10-CM | POA: Diagnosis present

## 2024-06-25 DIAGNOSIS — I9589 Other hypotension: Secondary | ICD-10-CM | POA: Diagnosis present

## 2024-06-25 DIAGNOSIS — Z8262 Family history of osteoporosis: Secondary | ICD-10-CM | POA: Diagnosis not present

## 2024-06-25 DIAGNOSIS — Z820 Family history of epilepsy and other diseases of the nervous system: Secondary | ICD-10-CM

## 2024-06-25 DIAGNOSIS — I5032 Chronic diastolic (congestive) heart failure: Secondary | ICD-10-CM | POA: Diagnosis not present

## 2024-06-25 DIAGNOSIS — I130 Hypertensive heart and chronic kidney disease with heart failure and stage 1 through stage 4 chronic kidney disease, or unspecified chronic kidney disease: Secondary | ICD-10-CM | POA: Diagnosis present

## 2024-06-25 DIAGNOSIS — E1122 Type 2 diabetes mellitus with diabetic chronic kidney disease: Secondary | ICD-10-CM | POA: Diagnosis present

## 2024-06-25 LAB — CBC WITH DIFFERENTIAL/PLATELET
Abs Immature Granulocytes: 0.03 K/uL (ref 0.00–0.07)
Basophils Absolute: 0 K/uL (ref 0.0–0.1)
Basophils Relative: 0 %
Eosinophils Absolute: 0 K/uL (ref 0.0–0.5)
Eosinophils Relative: 1 %
HCT: 42.9 % (ref 36.0–46.0)
Hemoglobin: 13.3 g/dL (ref 12.0–15.0)
Immature Granulocytes: 1 %
Lymphocytes Relative: 5 %
Lymphs Abs: 0.3 K/uL — ABNORMAL LOW (ref 0.7–4.0)
MCH: 23.1 pg — ABNORMAL LOW (ref 26.0–34.0)
MCHC: 31 g/dL (ref 30.0–36.0)
MCV: 74.5 fL — ABNORMAL LOW (ref 80.0–100.0)
Monocytes Absolute: 0.5 K/uL (ref 0.1–1.0)
Monocytes Relative: 8 %
Neutro Abs: 5.6 K/uL (ref 1.7–7.7)
Neutrophils Relative %: 85 %
Platelets: 260 K/uL (ref 150–400)
RBC: 5.76 MIL/uL — ABNORMAL HIGH (ref 3.87–5.11)
RDW: 21.4 % — ABNORMAL HIGH (ref 11.5–15.5)
Smear Review: NORMAL
WBC: 6.5 K/uL (ref 4.0–10.5)
nRBC: 0 % (ref 0.0–0.2)

## 2024-06-25 LAB — BASIC METABOLIC PANEL WITH GFR
Anion gap: 13 (ref 5–15)
Anion gap: 14 (ref 5–15)
BUN: 86 mg/dL — ABNORMAL HIGH (ref 8–23)
BUN: 80 mg/dL — ABNORMAL HIGH (ref 8–23)
CO2: 25 mmol/L (ref 22–32)
CO2: 23 mmol/L (ref 22–32)
Calcium: 9.4 mg/dL (ref 8.9–10.3)
Calcium: 9.5 mg/dL (ref 8.9–10.3)
Chloride: 83 mmol/L — ABNORMAL LOW (ref 98–111)
Chloride: 84 mmol/L — ABNORMAL LOW (ref 98–111)
Creatinine, Ser: 2.18 mg/dL — ABNORMAL HIGH (ref 0.44–1.00)
Creatinine, Ser: 1.96 mg/dL — ABNORMAL HIGH (ref 0.44–1.00)
GFR, Estimated: 23 mL/min — ABNORMAL LOW (ref >60)
GFR, Estimated: 26 mL/min — ABNORMAL LOW (ref >60)
Glucose, Bld: 266 mg/dL — ABNORMAL HIGH (ref 70–99)
Glucose, Bld: 179 mg/dL — ABNORMAL HIGH (ref 70–99)
Potassium: 2.7 mmol/L — CL (ref 3.5–5.1)
Potassium: 3.6 mmol/L (ref 3.5–5.1)
Sodium: 121 mmol/L — ABNORMAL LOW (ref 135–145)
Sodium: 121 mmol/L — ABNORMAL LOW (ref 135–145)

## 2024-06-25 LAB — MAGNESIUM: Magnesium: 2.5 mg/dL — ABNORMAL HIGH (ref 1.7–2.4)

## 2024-06-25 LAB — GLUCOSE, CAPILLARY
Glucose-Capillary: 147 mg/dL — ABNORMAL HIGH (ref 70–99)
Glucose-Capillary: 152 mg/dL — ABNORMAL HIGH (ref 70–99)
Glucose-Capillary: 146 mg/dL — ABNORMAL HIGH (ref 70–99)

## 2024-06-25 LAB — PHOSPHORUS: Phosphorus: 2.7 mg/dL (ref 2.5–4.6)

## 2024-06-25 MED ORDER — OXYCODONE HCL 5 MG PO TABS
5.0000 mg | ORAL_TABLET | Freq: Once | ORAL | Status: AC
  Administered 2024-06-25: 5 mg via ORAL
  Filled 2024-06-25: qty 1

## 2024-06-25 MED ORDER — HYDROMORPHONE HCL 1 MG/ML IJ SOLN
0.5000 mg | INTRAMUSCULAR | Status: DC | PRN
  Administered 2024-06-25 – 2024-06-26 (×2): 1 mg via INTRAVENOUS
  Filled 2024-06-25 (×2): qty 1

## 2024-06-25 MED ORDER — FUROSEMIDE 10 MG/ML IJ SOLN
20.0000 mg | Freq: Two times a day (BID) | INTRAMUSCULAR | Status: DC
  Administered 2024-06-25 – 2024-06-26 (×2): 20 mg via INTRAVENOUS
  Filled 2024-06-25 (×2): qty 4

## 2024-06-25 MED ORDER — MIDODRINE HCL 5 MG PO TABS: 10.0000 mg | ORAL_TABLET | Freq: Three times a day (TID) | ORAL | Status: DC

## 2024-06-25 MED ORDER — ONDANSETRON HCL 4 MG/2ML IJ SOLN: 4.0000 mg | Freq: Four times a day (QID) | INTRAMUSCULAR | Status: DC | PRN

## 2024-06-25 MED ORDER — SENNOSIDES-DOCUSATE SODIUM 8.6-50 MG PO TABS
1.0000 | ORAL_TABLET | Freq: Every evening | ORAL | Status: DC | PRN
  Administered 2024-06-28: 1 via ORAL
  Filled 2024-06-25: qty 1

## 2024-06-25 MED ORDER — METOLAZONE 5 MG PO TABS
5.0000 mg | ORAL_TABLET | Freq: Every day | ORAL | Status: DC
  Administered 2024-06-25 – 2024-06-30 (×6): 5 mg via ORAL
  Filled 2024-06-25 (×6): qty 1

## 2024-06-25 MED ORDER — SPIRONOLACTONE 25 MG PO TABS
50.0000 mg | ORAL_TABLET | Freq: Every day | ORAL | Status: DC
  Administered 2024-06-25 – 2024-06-30 (×6): 50 mg via ORAL
  Filled 2024-06-25 (×6): qty 2

## 2024-06-25 MED ORDER — PANTOPRAZOLE SODIUM 40 MG PO TBEC
40.0000 mg | DELAYED_RELEASE_TABLET | Freq: Two times a day (BID) | ORAL | Status: DC
  Administered 2024-06-25 – 2024-06-30 (×11): 40 mg via ORAL
  Filled 2024-06-25 (×11): qty 1

## 2024-06-25 MED ORDER — INSULIN ASPART 100 UNIT/ML IJ SOLN
0.0000 [IU] | Freq: Three times a day (TID) | INTRAMUSCULAR | Status: DC
  Administered 2024-06-25: 1 [IU] via SUBCUTANEOUS
  Administered 2024-06-25: 2 [IU] via SUBCUTANEOUS
  Administered 2024-06-26: 1 [IU] via SUBCUTANEOUS
  Administered 2024-06-26 – 2024-06-27 (×3): 2 [IU] via SUBCUTANEOUS
  Administered 2024-06-27: 3 [IU] via SUBCUTANEOUS
  Administered 2024-06-27: 2 [IU] via SUBCUTANEOUS
  Administered 2024-06-28: 1 [IU] via SUBCUTANEOUS
  Administered 2024-06-28: 7 [IU] via SUBCUTANEOUS
  Administered 2024-06-28 – 2024-06-29 (×4): 2 [IU] via SUBCUTANEOUS
  Administered 2024-06-30: 3 [IU] via SUBCUTANEOUS
  Administered 2024-06-30: 1 [IU] via SUBCUTANEOUS
  Filled 2024-06-25 (×16): qty 1

## 2024-06-25 MED ORDER — ACETAMINOPHEN 325 MG PO TABS
650.0000 mg | ORAL_TABLET | Freq: Four times a day (QID) | ORAL | Status: DC | PRN
  Administered 2024-06-26: 650 mg via ORAL
  Filled 2024-06-25 (×2): qty 2

## 2024-06-25 MED ORDER — TORSEMIDE 20 MG PO TABS: 20.0000 mg | ORAL_TABLET | Freq: Every day | ORAL | Status: DC

## 2024-06-25 MED ORDER — ALLOPURINOL 100 MG PO TABS
100.0000 mg | ORAL_TABLET | Freq: Every day | ORAL | Status: DC
  Administered 2024-06-25 – 2024-06-30 (×6): 100 mg via ORAL
  Filled 2024-06-25 (×6): qty 1

## 2024-06-25 MED ORDER — ACETAMINOPHEN 650 MG RE SUPP
650.0000 mg | Freq: Four times a day (QID) | RECTAL | Status: DC | PRN
Start: 2024-06-25 — End: 2024-06-30

## 2024-06-25 MED ORDER — HEPARIN SODIUM (PORCINE) 5000 UNIT/ML IJ SOLN
5000.0000 [IU] | Freq: Two times a day (BID) | INTRAMUSCULAR | Status: DC
  Administered 2024-06-25 – 2024-06-30 (×11): 5000 [IU] via SUBCUTANEOUS
  Filled 2024-06-25 (×11): qty 1

## 2024-06-25 MED ORDER — ONDANSETRON HCL 4 MG PO TABS: 4.0000 mg | ORAL_TABLET | Freq: Four times a day (QID) | ORAL | Status: DC | PRN

## 2024-06-25 MED ORDER — OXYCODONE HCL 5 MG PO TABS
5.0000 mg | ORAL_TABLET | Freq: Four times a day (QID) | ORAL | Status: DC | PRN
  Administered 2024-06-27 – 2024-06-30 (×8): 5 mg via ORAL
  Filled 2024-06-25 (×9): qty 1

## 2024-06-25 MED ORDER — POTASSIUM CHLORIDE CRYS ER 20 MEQ PO TBCR
40.0000 meq | EXTENDED_RELEASE_TABLET | ORAL | Status: AC
  Administered 2024-06-25 (×3): 40 meq via ORAL
  Filled 2024-06-25 (×2): qty 2

## 2024-06-25 MED ORDER — ASPIRIN 81 MG PO TBEC
81.0000 mg | DELAYED_RELEASE_TABLET | Freq: Every day | ORAL | Status: DC
  Administered 2024-06-25 – 2024-06-30 (×6): 81 mg via ORAL
  Filled 2024-06-25 (×6): qty 1

## 2024-06-25 MED ORDER — BISACODYL 5 MG PO TBEC
5.0000 mg | DELAYED_RELEASE_TABLET | Freq: Every day | ORAL | Status: DC | PRN
  Administered 2024-06-29: 5 mg via ORAL
  Filled 2024-06-25: qty 1

## 2024-06-25 MED ORDER — LATANOPROST 0.005 % OP SOLN
1.0000 [drp] | Freq: Every day | OPHTHALMIC | Status: DC
  Administered 2024-06-25 – 2024-06-29 (×5): 1 [drp] via OPHTHALMIC
  Filled 2024-06-25: qty 2.5

## 2024-06-25 NOTE — Evaluation (Signed)
 Occupational Therapy Evaluation Patient Details Name: Karen Farley MRN: [id number] DOB: Jul 06, 1946 Today's Date: 06/25/2024   History of Present Illness   Pt is a 78 year old female who twisted and hit her ankle on car door with resulting acute oblique nondisplaced fracture of the distal fibula. PMH of diastolic CHF, hypertension, hyperlipidemia, diabetes mellitus, PAD, gout, GI bleeding, CKD-3B, A-fib on Eliquis , chronic hyponatremia, chronic venous insufficiency, lymphedema.     Clinical Impressions Pt was seen for OT/PT co-evaluations this date to maximize pt/therapist safety. Pt is oriented to person, place and situation, but stated it was August 2025. Husband provided history as pt was becoming lethargic from recent IV pain meds. She lives in a one level condo with 1 STE with her husband and ambulates with a RW limited community distances at baseline. She is MOD I with ADL performance and husband assists with IADLs.  Pt presents with deficits in strength, balance, and activity tolerance, affecting safe and optimal ADL completion. Pt currently requires Min/Mod A x2 for bed mobility with cues for hand placement and technique, assist for LLE management. She is noted with edema to BLEs and redness/bumps to RLE-MD and nurse notified, although pt reports this is a baseline issue. Max A to don R sock. Pt becoming very lethargic during session and BP checked: 109/69. She required Mod A x2 for STS from EOB to RW with inability to maintain TDWB precaution on LLE despite blocking from PT. Pt with heavy reliance on BUEs on RW in standing and increased assist for R weight shift to offload from LLE. Pt quickly wishing to return to seated position. She was able to lateral scoot towards [facility] with increased time, CGA and support of LLE to prevent weight bearing. Elevated BLEs at end of session. Anticipate pt will participate better once less lethargic, currently anticipate Max A for LB ADL performance and CGA  for UB ADLs.  Pt would benefit from skilled OT services to address noted impairments and functional limitations to maximize safety and independence while minimizing future risk of falls, injury, and readmission. Do anticipate the need for follow up OT services upon acute hospital DC.      If plan is discharge home, recommend the following:   A lot of help with bathing/dressing/bathroom;Two people to help with walking and/or transfers;Help with stairs or ramp for entrance;Assist for transportation;Assistance with cooking/housework     Functional Status Assessment   Patient has had a recent decline in their functional status and demonstrates the ability to make significant improvements in function in a reasonable and predictable amount of time.     Equipment Recommendations   Other (comment) (defer to next venue)     Recommendations for Other Services         Precautions/Restrictions   Precautions Precautions: Fall Recall of Precautions/Restrictions: Impaired Precaution/Restrictions Comments: touch-down WB LLE Required Braces or Orthoses: Splint/Cast Splint/Cast: L ankle splint Restrictions Weight Bearing Restrictions Per Provider Order: Yes LLE Weight Bearing Per Provider Order: Touchdown weight bearing     Mobility Bed Mobility Overal bed mobility: Needs Assistance Bed Mobility: Supine to Sit, Sit to Supine     Supine to sit: Min assist, Mod assist, +2 for physical assistance, HOB elevated, Used rails Sit to supine: Mod assist, +2 for physical assistance   General bed mobility comments: initiated movement and able to utilized bed rail to assist with trunkal elevation, increased assist for LLE management needed    Transfers Overall transfer level: Needs assistance Equipment used: Rolling  walker (2 wheels) Transfers: Sit to/from Stand, Bed to chair/wheelchair/BSC Sit to Stand: Mod assist, +2 physical assistance          Lateral/Scoot Transfers: Contact  guard assist General transfer comment: difficulty maintaining TDWB to LLE even with blocking for standing attempts and max cueing and reminders, increased assist for R weight shift to take weight off LLE; able to lateral scoot on EOB with CGA and support of LLE to prevent weight bearing with increased time/effort noted; pt very limited by grogginess/fatigue from recent pain meds      Balance Overall balance assessment: Needs assistance Sitting-balance support: Feet supported, Bilateral upper extremity supported Sitting balance-Leahy Scale: Fair Sitting balance - Comments: BUE support on bed rail/bed to maitain seated balance d/t fatigue/grogginess   Standing balance support: Reliant on assistive device for balance, Bilateral upper extremity supported Standing balance-Leahy Scale: Poor Standing balance comment: x2 for external support and increased BUE support on RW with difficulty maintaining TDWB on LLE                           ADL either performed or assessed with clinical judgement   ADL Overall ADL's : Needs assistance/impaired                     Lower Body Dressing: Maximal assistance;Bed level Lower Body Dressing Details (indicate cue type and reason): donn L sock               General ADL Comments: anticipate Max A for LB ADL management, SBA for UB ADLs     Vision         Perception         Praxis         Pertinent Vitals/Pain       Extremity/Trunk Assessment Upper Extremity Assessment Upper Extremity Assessment: Generalized weakness   Lower Extremity Assessment Lower Extremity Assessment: Generalized weakness;LLE deficits/detail LLE Deficits / Details: TDWB with fibula fx       Communication Communication Communication: No apparent difficulties   Cognition Arousal: Lethargic Behavior During Therapy: WFL for tasks assessed/performed, Flat affect Cognition: Cognition impaired   Orientation impairments: Time         OT -  Cognition Comments: likely cognition affected by pain meds and drowsiness, oriented to person, place and situation                 Following commands: Intact       Cueing  General Comments   Cueing Techniques: Verbal cues;Tactile cues  denies pain, but very limited by grogginess from pain meds with BP reading of 109/69; RLE edema/redness/bumps and distended abdomen (pt reports these are baseline issues)   Exercises Other Exercises Other Exercises: Edu on role of OT in acute setting.   Shoulder Instructions      Home Living Family/patient expects to be discharged to:: Private residence Living Arrangements: Spouse/significant other Available Help at Discharge: Family;Available 24 hours/day Type of Home: House (condo) Home Access: Stairs to enter [employer] of Steps: 1 step from garage   Home Layout: One level     Bathroom Shower/Tub: Producer, television/film/video: Handicapped height Bathroom Accessibility: Yes   Home Equipment: Agricultural consultant (2 wheels);Grab bars - tub/shower;Shower seat;Hand held shower head          Prior Functioning/Environment Prior Level of Function : Independent/Modified Independent;Driving             Mobility  Comments: using RW since June per husband ADLs Comments: MOD I, was driving prior to June; husband assists with IADLs    OT Problem List: Decreased strength;Decreased activity tolerance;Impaired balance (sitting and/or standing);Decreased safety awareness   OT Treatment/Interventions: Self-care/ADL training;Balance training;Therapeutic exercise;Therapeutic activities;Patient/family education      OT Goals(Current goals can be found in the care plan section)   Acute Rehab OT Goals Patient Stated Goal: improve function OT Goal Formulation: With patient Time For Goal Achievement: 07/09/24 Potential to Achieve Goals: Fair ADL Goals Pt Will Perform Grooming: with set-up;sitting Pt Will Perform Lower Body  Dressing: sitting/lateral leans;sit to/from stand;with min assist Pt Will Transfer to Toilet: with min assist;stand pivot transfer   OT Frequency:  Min 2X/week    Co-evaluation PT/OT/SLP Co-Evaluation/Treatment: Yes Reason for Co-Treatment: For patient/therapist safety;To address functional/ADL transfers PT goals addressed during session: Mobility/safety with mobility;Proper use of DME OT goals addressed during session: ADL's and self-care      AM-PAC OT 6 Clicks Daily Activity     Outcome Measure Help from another person eating meals?: None Help from another person taking care of personal grooming?: None Help from another person toileting, which includes using toliet, bedpan, or urinal?: A Lot Help from another person bathing (including washing, rinsing, drying)?: A Lot Help from another person to put on and taking off regular upper body clothing?: A Little Help from another person to put on and taking off regular lower body clothing?: A Lot 6 Click Score: 17   End of Session Equipment Utilized During Treatment: Gait belt;Rolling walker (2 wheels)  Activity Tolerance: Patient tolerated treatment well;Patient limited by lethargy Patient left: in bed;with call bell/phone within reach;with bed alarm set  OT Visit Diagnosis: Other abnormalities of gait and mobility (R26.89);Muscle weakness (generalized) (M62.81);Unsteadiness on feet (R26.81)                Time: 8494-8465 OT Time Calculation (min): 29 min Charges:  OT General Charges $OT Visit: 1 Visit OT Evaluation $OT Eval Moderate Complexity: 1 Mod Benedetto Ryder, OTR/L 06/25/24, 4:23 PM  Alizay Bronkema E Latori Beggs 06/25/2024, 4:17 PM

## 2024-06-25 NOTE — H&P (Signed)
 History and Physical    Karen Farley FMW:[id number] DOB: Jul 24, 1946 DOA: 06/25/2024  PCP: Vicci Duwaine SQUIBB, DO (Confirm with patient/family/NH records and if not entered, this has to be entered at [facility] point of entry) Patient coming from: Home  I have personally briefly reviewed patient's old medical records in [facility]  Chief Complaint: I fell and broke left ankle  HPI: Karen Farley is a 78 y.o. female with medical history significant of IIDM, HTN, HLD, advanced chronic HFpEF, constrictive pericarditis, chronic A-fib, CKD stage IV, chronic hyponatremia, chronic lymphedema, presented with mechanical fall and left ankle fracture.  At baseline patient uses roller walker to ambulate since last year.  Yesterday, while exiting her car, she tripped left foot between curbside and her car she fell back to her car and twisted left ankle.  Immediately starting to have excruciating pain of the left ankle and unable to put any weight on it.  Denied any prodrome of lightheadedness, shortness of breath or chest pain.  ED Course: Afebrile, not tachycardia no hypotension not hypoxic, x-ray showed acute oblique nondisplaced fracture of the distal fibula.  As intrastate by podiatry, a splinter was put on.  Blood work showed sodium 121 potassium 2.7 BUN 86 creatinine 2.1 glucose 266.  Review of Systems: As per HPI otherwise 14 point review of systems negative.    Past Medical History:  Diagnosis Date   A-fib [facility])    CHF (congestive heart failure) (HCC)    Chronic heart failure with preserved ejection fraction (HFpEF) (HCC)    a. 11/2019 Echo: EF 60-65%; b. 07/2021 Echo: EF 60-65%.   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 07/2021 req 2u prbcs. Eval deferred given resolution of bleeding  and cardiac issues.   Hyperlipidemia    Hypertension    Lymphedema    Morbid obesity (HCC)    Osteoarthritis of both knees    Osteopenia    Pericardial effusion    a. 07/2021 Echo: Nl EF. Large pericardial effusion-->s/p pericardiocentesis of ; b. 08/23/2021 Echo: EF 60-65%, mild LVH, Nl RV fxn, sev dil LA, moderate pericardial effusion. L pleural effusion.   Permanent atrial fibrillation (HCC)    Pleural effusion, left    a. 07/2021 s/p thoracentesis - .    Past Surgical History:  Procedure Laterality Date   BASAL CELL CARCINOMA EXCISION  11/01/2022   CATARACT EXTRACTION W/PHACO Left 10/09/2021   Procedure: CATARACT EXTRACTION PHACO AND INTRAOCULAR LENS PLACEMENT (IOC) LEFT DIABETIC 4.84 00:37.8;  Surgeon: Jaye Fallow, MD;  Location: [facility];  Service: Ophthalmology;  Laterality: Left;  Diabetic   CATARACT EXTRACTION W/PHACO Right 10/30/2021   Procedure: CATARACT EXTRACTION PHACO AND INTRAOCULAR LENS PLACEMENT (IOC) RIGHT DIABETIC 5.65 00:38.5;  Surgeon: Jaye Fallow, MD;  Location: [facility];  Service: Ophthalmology;  Laterality: Right;  Diabetic   CHOLECYSTECTOMY     DILATION AND CURETTAGE OF UTERUS     IR FLUORO GUIDE CV LINE RIGHT  08/16/2021   IR THORACENTESIS ASP PLEURAL SPACE W/IMG GUIDE  08/16/2021   PERICARDIOCENTESIS N/A 08/05/2021   Procedure: PERICARDIOCENTESIS;  Surgeon: Ammon Blunt, MD;  Location: [facility] INVASIVE CV LAB;  Service: Cardiovascular;  Laterality: N/A;   RIGHT AND LEFT HEART CATH N/A 03/29/2024   Procedure: RIGHT  AND LEFT HEART CATH;  Surgeon: Zenaida Morene PARAS, MD;  Location: [facility] INVASIVE CV LAB;  Service: Cardiovascular;  Laterality: N/A;   RIGHT HEART CATH N/A 03/22/2024   Procedure: RIGHT HEART CATH;  Surgeon: Darron Deatrice LABOR, MD;  Location: [facility] INVASIVE CV LAB;  Service: Cardiovascular;  Laterality: N/A;   TEAR DUCT PROBING WITH STRABISMUS REPAIR Right    TONSILLECTOMY       reports that she has never  smoked. She has never used smokeless tobacco. She reports that she does not currently use alcohol. She reports that she does not use drugs.  No Known Allergies  Family History  Problem Relation Age of Onset   Heart disease Mother        CHF   Osteoporosis Mother    Breast cancer Mother    Heart disease Father 59   Cancer Brother        Colon CA- 2002- Youngest brother   Parkinson's disease Brother        Younger Brother   Heart disease Brother        2 brothers   Diabetes Brother    Cancer Brother    Breast cancer Maternal Grandmother      Prior to Admission medications   Medication Sig Start Date End Date Taking? Authorizing Provider  allopurinol  (ZYLOPRIM ) 100 MG tablet Take 1 tablet (100 mg total) by mouth daily. 12/19/23   Johnson, [name], DO  latanoprost  (XALATAN ) 0.005 % ophthalmic solution Place 1 drop into both eyes at bedtime.    [provider]  metolazone  (ZAROXOLYN ) 5 MG tablet Take 5 mg by mouth daily.    [provider]  oxyCODONE  (OXY IR/ROXICODONE ) 5 MG immediate release tablet Take 1 tablet (5 mg total) by mouth every 6 (six) hours as needed for severe pain (pain score 7-10) or breakthrough pain. 04/03/24   Jhonny Calvin NOVAK, MD  pantoprazole  (PROTONIX ) 40 MG tablet Take 1 tablet (40 mg total) by mouth 2 (two) times daily. 12/19/23   Vicci Bouchard P, DO  spironolactone  (ALDACTONE ) 50 MG tablet Take 1 tablet (50 mg total) by mouth daily. 12/19/23   Vicci Bouchard P, DO  torsemide  (DEMADEX ) 20 MG tablet 2 tablets by mouth daily 05/24/24   Lenetta No D, NP    Physical Exam: Vitals:   06/25/24 0029 06/25/24 0330 06/25/24 0509 06/25/24 0749  BP:  102/64 105/72 105/73  Pulse:  67 74 75  Resp:  18 18 16   Temp: (!) 97.5 F (36.4 C) 98.1 F (36.7 C)    TempSrc: Oral Oral    SpO2:  98% 100% 100%  Weight:  68.5 kg    Height:  5' (1.524 m)      Constitutional: NAD, calm, comfortable Vitals:   06/25/24 0029 06/25/24 0330 06/25/24 0509 06/25/24  0749  BP:  102/64 105/72 105/73  Pulse:  67 74 75  Resp:  18 18 16   Temp: (!) 97.5 F (36.4 C) 98.1 F (36.7 C)    TempSrc: Oral Oral    SpO2:  98% 100% 100%  Weight:  68.5 kg    Height:  5' (1.524 m)     Eyes: PERRL, lids and conjunctivae normal ENMT: Mucous membranes are moist. Posterior pharynx clear of any exudate or lesions.Normal dentition.  Neck: normal, supple, no masses, no thyromegaly Respiratory: clear to auscultation bilaterally, no wheezing, no crackles. Normal respiratory effort. No accessory muscle use.  Cardiovascular: Regular rate and rhythm, no murmurs / rubs / gallops. 2+  extremity edema. 2+ pedal pulses. No carotid bruits.  Abdomen: no tenderness, no masses palpated. No hepatosplenomegaly. Bowel sounds positive.  Musculoskeletal: no clubbing / cyanosis. No joint deformity upper and lower extremities. Good ROM, no contractures. Normal muscle tone.  Skin: no rashes, lesions, ulcers. No induration Neurologic: CN 2-12 grossly intact. Sensation intact, DTR normal. Strength 5/5 in all 4.  Psychiatric: Normal judgment and insight. Alert and oriented x 3. Normal mood.    Labs on Admission: I have personally reviewed following labs and imaging studies  CBC: Recent Labs  Lab 06/25/24 0330  WBC 6.5  NEUTROABS 5.6  HGB 13.3  HCT 42.9  MCV 74.5*  PLT 260   Basic Metabolic Panel: Recent Labs  Lab 06/25/24 0330  NA 121*  K 2.7*  CL 83*  CO2 25  GLUCOSE 266*  BUN 86*  CREATININE 2.18*  CALCIUM  9.4  MG 2.5*  PHOS 2.7   GFR: Estimated Creatinine Clearance: 18.4 mL/min (A) (by C-G formula based on SCr of 2.18 mg/dL (H)). Liver Function Tests: No results for input(s): AST, ALT, ALKPHOS, BILITOT, PROT, ALBUMIN  in the last 168 hours. No results for input(s): LIPASE, AMYLASE in the last 168 hours. No results for input(s): AMMONIA in the last 168 hours. Coagulation Profile: No results for input(s): INR, PROTIME in the last 168  hours. Cardiac Enzymes: No results for input(s): CKTOTAL, CKMB, CKMBINDEX, TROPONINI in the last 168 hours. BNP (last 3 results) No results for input(s): PROBNP in the last 8760 hours. HbA1C: No results for input(s): HGBA1C in the last 72 hours. CBG: No results for input(s): GLUCAP in the last 168 hours. Lipid Profile: No results for input(s): CHOL, HDL, LDLCALC, TRIG, CHOLHDL, LDLDIRECT in the last 72 hours. Thyroid  Function Tests: No results for input(s): TSH, T4TOTAL, FREET4, T3FREE, THYROIDAB in the last 72 hours. Anemia Panel: No results for input(s): VITAMINB12, FOLATE, FERRITIN, TIBC, IRON , RETICCTPCT in the last 72 hours. Urine analysis:    Component Value Date/Time   COLORURINE YELLOW (A) 04/11/2021 1215   APPEARANCEUR Clear 12/13/2022 1322   LABSPEC 1.011 04/11/2021 1215   PHURINE 5.0 04/11/2021 1215   GLUCOSEU 2+ (A) 12/13/2022 1322   HGBUR NEGATIVE 04/11/2021 1215   BILIRUBINUR Negative 12/13/2022 1322   KETONESUR NEGATIVE 04/11/2021 1215   PROTEINUR Negative 12/13/2022 1322   PROTEINUR NEGATIVE 04/11/2021 1215   NITRITE Negative 12/13/2022 1322   NITRITE NEGATIVE 04/11/2021 1215   LEUKOCYTESUR Negative 12/13/2022 1322   LEUKOCYTESUR MODERATE (A) 04/11/2021 1215    Radiological Exams on Admission: DG Ankle Complete Left Result Date: 06/25/2024 CLINICAL DATA:  Injury EXAM: LEFT ANKLE COMPLETE - 3+ VIEW FINDINGS: There is diffuse soft tissue swelling of the visualized lower extremity and ankle. There are 2 well corticated densities adjacent to the medial malleolus which are likely related to old injury. There is an acute oblique nondisplaced fracture of the distal fibula at and above the level of the ankle mortise. Joint spaces are well maintained. IMPRESSION: Acute oblique nondisplaced fracture of the distal fibula. Electronically Signed   By: Greig Pique M.D.   On: 06/25/2024 00:49    EKG:  Ordered  Assessment/Plan Principal Problem:   Unable to ambulate Active Problems:   Acute on chronic diastolic heart failure (HCC)   Hyponatremia   Lymphedema   Ankle fracture  (please populate well all problems here in Problem List. (For example, if patient is on BP meds at home and you resume or decide to hold them, it is a problem that  needs to be her. Same for CAD, COPD, HLD and so on)  Left distal tibial nondisplaced fracture - Secondary to mechanical fall - Nonoperable, got detailed instruction for PT from podiatry, Would prefer she stay off of it as much as possible. Should put most of her weight on the other foot and may put gentle contact on the foot. They could place her in a boot with a compression dressing but she would have to keep the boot on for the most part at all times as it would be acting as the splint/cast.   History of constrictive pericarditis - Was evaluated 2 days ago by cardiology - When cardiology recommended continue current diuresis of torsemide , metolazone  and spironolactone .  There was a discussion between cardiology and nephrology regarding possibility of patient to startiHD for volume removal however even short-term HD was considered to be challenging given her history of hypotension as per cardiology.  Hyponatremia, chronic - Severe volume overload, likely secondary to chronic CHF decompensation.  Baseline sodium level 125 and corrected sodium level today is 125. - Discussed with patient, patient reported that she only practice loose fluid restriction.  Discussed with her regarding do not take more than 2000 mL fluid a day. - Switch diuresis to IV Lasix  20 mg twice daily while in the hospital  CKD stage IV - Creatinine level stable - Severe volume overloaded, diuresis as above  Hypotension - Was on midodrine , there were different opinions regarding use of midodrine  between cardiology and nephrology.  Plan to hold midodrine  as well as patient does not  have symptoms of hypotension.   PAF - In sinus rhythm - Check EKG - Not on systemic anticoagulation -Start ASA   Total time spent on patient care 75 minutes  DVT prophylaxis: Heparin  subcu Code Status: Full code Family Communication: None at bedside Disposition Plan: Patient is sick with ankle fracture requiring extensive PT therapy and pain management, expect more than 2 midnight hospital stay, expect patient discharged to SNF Consults called: Curbside consult with podiatry Admission status: MedSurg admission   Cort ONEIDA Mana MD [facility] Pager [phone]  06/25/2024, 10:24 AM

## 2024-06-25 NOTE — ED Notes (Signed)
 Floor called to complete handoff

## 2024-06-25 NOTE — ED Notes (Signed)
 Patient transported to X-ray

## 2024-06-25 NOTE — Evaluation (Signed)
 Physical Therapy Evaluation Patient Details Name: Karen Farley MRN: [id number] DOB: 10/04/1946 Today's Date: 06/25/2024  History of Present Illness  Pt is a 78 year old female who twisted and hit her ankle on car door with resulting acute oblique nondisplaced fracture of the distal fibula. PMH of diastolic CHF, hypertension, hyperlipidemia, diabetes mellitus, PAD, gout, GI bleeding, CKD-3B, A-fib on Eliquis , chronic hyponatremia, chronic venous insufficiency, lymphedema.   Clinical Impression  Pt A&Ox3, disoriented to month, agreeable to PT/OT co-evaluation. Per pt's husband, pt is modI with mobility at baseline with recent use of RW for amb, IND with basic ADLs. Pt noted to be very drowsy this session, thorough education provided on touch-down WB precautions during session. BP 109/69 (78) at start of session. Pt able to initiate supine <> sit transfers, ultimately required min-modAx2 for trunk/BLE assist. Attempted a STS from EOB with RW, required min-modAx2 to achieve standing, however pt noted to be putting weight through LLE during transfer. Additional amb/standing deferred at this time. Pt able to laterally scoot hips toward [facility] with CGA for LLE. Pt was left supine in bed, BLE elevated, all needs within reach. Pt is currently displaying deficits in strength, balance, functional mobility, and activity tolerance and would benefit from skilled PT intervention to address listed deficits and improve independence with mobility/ADLs.        If plan is discharge home, recommend the following: Two people to help with walking and/or transfers;Two people to help with bathing/dressing/bathroom;Assistance with cooking/housework;Assist for transportation;Help with stairs or ramp for entrance   Can travel by private vehicle   No    Equipment Recommendations Other (comment) (TBD)  Recommendations for Other Services       Functional Status Assessment Patient has had a recent decline in their  functional status and demonstrates the ability to make significant improvements in function in a reasonable and predictable amount of time.     Precautions / Restrictions Precautions Precautions: Fall Recall of Precautions/Restrictions: Impaired Precaution/Restrictions Comments: touch-down WB LLE Required Braces or Orthoses: Splint/Cast Splint/Cast: L ankle splint Restrictions Weight Bearing Restrictions Per Provider Order: Yes LLE Weight Bearing Per Provider Order: Touchdown weight bearing      Mobility  Bed Mobility Overal bed mobility: Needs Assistance Bed Mobility: Supine to Sit, Sit to Supine     Supine to sit: Min assist, Mod assist, +2 for physical assistance, HOB elevated Sit to supine: Min assist, Mod assist, +2 for physical assistance   General bed mobility comments: pt able to initiate bed mobility, minA at trunk, modA for BLE    Transfers Overall transfer level: Needs assistance Equipment used: Rolling walker (2 wheels) Transfers: Sit to/from Stand, Bed to chair/wheelchair/BSC Sit to Stand: Min assist, Mod assist, +2 physical assistance          Lateral/Scoot Transfers: Contact guard assist General transfer comment: STS from EOB with min-modAx2, cues for hand placement and to maintain TWB status on LLE. Pt able to laterally scoot hips to R toward HOB with CGA at LLE.    Ambulation/Gait                  Stairs            Wheelchair Mobility     Tilt Bed    Modified Rankin (Stroke Patients Only)       Balance Overall balance assessment: Needs assistance Sitting-balance support: Feet supported Sitting balance-Leahy Scale: Fair Sitting balance - Comments: good static sitting, fair dynamic sitting Postural control: Posterior lean Standing  balance support: Bilateral upper extremity supported Standing balance-Leahy Scale: Fair                               Pertinent Vitals/Pain Pain Assessment Pain Assessment: No/denies  pain    Home Living Family/patient expects to be discharged to:: Private residence Living Arrangements: Spouse/significant other Available Help at Discharge: Family;Available 24 hours/day Type of Home: House (condo) Home Access: Stairs to enter   [employer] of Steps: 1 step from garage   Home Layout: One level Home Equipment: Agricultural consultant (2 wheels);Grab bars - tub/shower;Shower seat;Hand held shower head      Prior Function Prior Level of Function : Independent/Modified Independent;Driving             Mobility Comments: using RW since June per husband ADLs Comments: MOD I, was driving prior to June; husband assists with IADLs     Extremity/Trunk Assessment   Upper Extremity Assessment Upper Extremity Assessment: Generalized weakness    Lower Extremity Assessment Lower Extremity Assessment: Generalized weakness;LLE deficits/detail LLE Deficits / Details: TDWB with fibula fx       Communication   Communication Communication: No apparent difficulties    Cognition Arousal: Lethargic Behavior During Therapy: WFL for tasks assessed/performed, Flat affect   PT - Cognitive impairments: Orientation   Orientation impairments: Time                   PT - Cognition Comments: alert initially, required multiple cues to maintain eyes open during session Following commands: Intact       Cueing Cueing Techniques: Verbal cues, Tactile cues, Visual cues     General Comments General comments (skin integrity, edema, etc.): denies pain, but very limited by grogginess from pain meds with BP reading of 109/69; RLE edema/redness/bumps and distended abdomen (pt reports these are baseline issues)    Exercises Other Exercises Other Exercises: BP 109/69 (78) at beginning of session   Assessment/Plan    PT Assessment Patient needs continued PT services  PT Problem List Decreased strength;Decreased activity tolerance;Decreased balance;Decreased  mobility;Decreased knowledge of precautions;Pain       PT Treatment Interventions DME instruction;Gait training;Stair training;Functional mobility training;Therapeutic activities;Therapeutic exercise;Balance training;Neuromuscular re-education;Cognitive remediation;Patient/family education    PT Goals (Current goals can be found in the Care Plan section)  Acute Rehab PT Goals Patient Stated Goal: to get better PT Goal Formulation: With patient Time For Goal Achievement: 07/09/24 Potential to Achieve Goals: Fair    Frequency 7X/week     Co-evaluation PT/OT/SLP Co-Evaluation/Treatment: Yes Reason for Co-Treatment: For patient/therapist safety;To address functional/ADL transfers PT goals addressed during session: Mobility/safety with mobility;Proper use of DME OT goals addressed during session: ADL's and self-care       AM-PAC PT 6 Clicks Mobility  Outcome Measure Help needed turning from your back to your side while in a flat bed without using bedrails?: A Little Help needed moving from lying on your back to sitting on the side of a flat bed without using bedrails?: A Lot Help needed moving to and from a bed to a chair (including a wheelchair)?: A Lot Help needed standing up from a chair using your arms (e.g., wheelchair or bedside chair)?: A Lot Help needed to walk in hospital room?: A Lot Help needed climbing 3-5 steps with a railing? : Total 6 Click Score: 12    End of Session Equipment Utilized During Treatment: Gait belt Activity Tolerance: Patient limited by fatigue Patient  left: in bed;with call bell/phone within reach;with bed alarm set Nurse Communication: Mobility status PT Visit Diagnosis: Unsteadiness on feet (R26.81);Other abnormalities of gait and mobility (R26.89);Muscle weakness (generalized) (M62.81);Difficulty in walking, not elsewhere classified (R26.2);Pain Pain - Right/Left: Left Pain - part of body: Ankle and joints of foot    Time: 8494-8466 PT  Time Calculation (min) (ACUTE ONLY): 28 min   Charges:   PT Evaluation $PT Eval Low Complexity: 1 Low PT Treatments $Therapeutic Activity: 8-22 mins PT General Charges $$ ACUTE PT VISIT: 1 Visit         Janell Axe, SPT

## 2024-06-25 NOTE — ED Provider Notes (Signed)
 [facility] Provider Note    Event Date/Time   First MD Initiated Contact with Patient 06/25/24 [phone]     (approximate)   History   Ankle Pain   HPI  Karen Farley is a 78 y.o. female   Past medical history of multiple medical comorbidities including atrial fibrillation, CHF, diabetes, hypertension, hyperlipidemia, ambulates with a walker who presents with a left ankle injury.  She lost her footing and twisted her ankle while getting out of the car earlier today.  Lateral left ankle pain.  No other injuries.  Independent Historian contributed to assessment above: Husband corroborates information above      Physical Exam   Triage Vital Signs: ED Triage Vitals  Encounter Vitals Group     BP 06/25/24 0028 100/83     Girls Systolic BP Percentile --      Girls Diastolic BP Percentile --      Boys Systolic BP Percentile --      Boys Diastolic BP Percentile --      Pulse Rate 06/25/24 0028 96     Resp 06/25/24 0028 18     Temp 06/25/24 0029 (!) 97.5 F (36.4 C)     Temp Source 06/25/24 0028 Oral     SpO2 06/25/24 0028 96 %     Weight --      Height --      Head Circumference --      Peak Flow --      Pain Score --      Pain Loc --      Pain Education --      Exclude from Growth Chart --     Most recent vital signs: Vitals:   06/25/24 0028 06/25/24 0029  BP: 100/83   Pulse: 96   Resp: 18   Temp:  (!) 97.5 F (36.4 C)  SpO2: 96%     General: Awake, no distress.  CV:  Good peripheral perfusion.  Resp:  Normal effort.  Abd:  No distention.  Other:  Left lateral malleolus tenderness to palpation without obvious deformity.  Bilateral lower extremity edema, pitting, equal on both sides.  Neurovascular intact.  Head to toe examination shows no other acute traumatic injuries.  Ranging at the hip without pain.  Ranging of the knees without pain.  Ranging at all joints of the upper extremity without issues.  No signs of head trauma, neck  supple full range of motion, no midline tenderness.  No chest wall or abdominal tenderness.   ED Results / Procedures / Treatments   Labs (all labs ordered are listed, but only abnormal results are displayed) Labs Reviewed  BASIC METABOLIC PANEL WITH GFR  CBC WITH DIFFERENTIAL/PLATELET    RADIOLOGY I independently reviewed and interpreted ankle x-ray and see fibula fracture I also reviewed radiologist's formal read.   PROCEDURES:  Critical Care performed: No  Procedures   MEDICATIONS ORDERED IN ED: Medications - No data to display  External physician / consultants:  I spoke with hospital medicine for admission and regarding care plan for this patient.   IMPRESSION / MDM / ASSESSMENT AND PLAN / ED COURSE  I reviewed the triage vital signs and the nursing notes.                                Patient's presentation is most consistent with acute presentation with potential threat to life or bodily function.  Differential diagnosis includes, but is not limited to, ankle fracture, dislocation neurovascular or tendon injury   MDM:    Nondisplaced fibula fracture, will place in a splint, and admit to hospitalist service for PT/OT/potential placement given that she normally walks with a walker and no longer able to do so.       FINAL CLINICAL IMPRESSION(S) / ED DIAGNOSES   Final diagnoses:  Nondisplaced fracture of lateral malleolus of left fibula, initial encounter for closed fracture     Rx / DC Orders   ED Discharge Orders     None        Note:  This document was prepared using Dragon voice recognition software and may include unintentional dictation errors.    Cyrena Mylar, MD 06/25/24 [phone]

## 2024-06-25 NOTE — ED Triage Notes (Signed)
 Pt says she hit her left ankle on the car door earlier today. No open wounds. Pain to the lateral posterior ankle. Took 2 tramadol  and tylenol  for pain.   Pt has bilateral lower extremity swelling, says is normal for her.

## 2024-06-26 DIAGNOSIS — I5032 Chronic diastolic (congestive) heart failure: Secondary | ICD-10-CM

## 2024-06-26 DIAGNOSIS — I890 Lymphedema, not elsewhere classified: Secondary | ICD-10-CM | POA: Diagnosis not present

## 2024-06-26 DIAGNOSIS — S8265XA Nondisplaced fracture of lateral malleolus of left fibula, initial encounter for closed fracture: Secondary | ICD-10-CM | POA: Diagnosis not present

## 2024-06-26 LAB — BASIC METABOLIC PANEL WITH GFR
Anion gap: 14 (ref 5–15)
BUN: 76 mg/dL — ABNORMAL HIGH (ref 8–23)
CO2: 27 mmol/L (ref 22–32)
Calcium: 9.8 mg/dL (ref 8.9–10.3)
Chloride: 85 mmol/L — ABNORMAL LOW (ref 98–111)
Creatinine, Ser: 1.99 mg/dL — ABNORMAL HIGH (ref 0.44–1.00)
GFR, Estimated: 25 mL/min — ABNORMAL LOW (ref >60)
Glucose, Bld: 135 mg/dL — ABNORMAL HIGH (ref 70–99)
Potassium: 3.7 mmol/L (ref 3.5–5.1)
Sodium: 126 mmol/L — ABNORMAL LOW (ref 135–145)

## 2024-06-26 LAB — CBC
HCT: 43.4 % (ref 36.0–46.0)
Hemoglobin: 13.6 g/dL (ref 12.0–15.0)
MCH: 22.9 pg — ABNORMAL LOW (ref 26.0–34.0)
MCHC: 31.3 g/dL (ref 30.0–36.0)
MCV: 73.1 fL — ABNORMAL LOW (ref 80.0–100.0)
Platelets: 270 K/uL (ref 150–400)
RBC: 5.94 MIL/uL — ABNORMAL HIGH (ref 3.87–5.11)
RDW: 21.6 % — ABNORMAL HIGH (ref 11.5–15.5)
WBC: 5.8 K/uL (ref 4.0–10.5)
nRBC: 0 % (ref 0.0–0.2)

## 2024-06-26 LAB — GLUCOSE, CAPILLARY
Glucose-Capillary: 134 mg/dL — ABNORMAL HIGH (ref 70–99)
Glucose-Capillary: 196 mg/dL — ABNORMAL HIGH (ref 70–99)
Glucose-Capillary: 179 mg/dL — ABNORMAL HIGH (ref 70–99)
Glucose-Capillary: 167 mg/dL — ABNORMAL HIGH (ref 70–99)

## 2024-06-26 MED ORDER — HYDROMORPHONE HCL 1 MG/ML IJ SOLN
0.5000 mg | INTRAMUSCULAR | Status: DC | PRN
  Administered 2024-06-28: 0.5 mg via INTRAVENOUS
  Filled 2024-06-26: qty 1

## 2024-06-26 MED ORDER — TORSEMIDE 20 MG PO TABS
40.0000 mg | ORAL_TABLET | Freq: Every day | ORAL | Status: DC
  Administered 2024-06-26 – 2024-06-28 (×3): 40 mg via ORAL
  Filled 2024-06-26 (×3): qty 2

## 2024-06-26 NOTE — Plan of Care (Signed)

## 2024-06-26 NOTE — Care Management Important Message (Signed)
 Important Message  Patient Details  Name: Karen Farley MRN: [id number] Date of Birth: [birth year]   Important Message Given:  Yes - Medicare IM     [name] 06/26/2024, 2:02 PM

## 2024-06-26 NOTE — Progress Notes (Addendum)
 PT Cancellation Note  Patient Details Name: Karen Farley MRN: [id number] DOB: 08/27/1946   Cancelled Treatment:     PT attempt. RN in room giving pain meds with spouse at bedside. Pt  unwilling to participate at this time and requested not having PT session today. Author will return later in hopes pt will be more willing to perform OOB activity.   1555: Author returned for PT session however pt remains unwilling. Per spouse, She just woke up from napping all day after taking that pain medicine earlier in the day. Max encouragement for OOB but pt refused.  [name] been not moving my leg like I'm suppose too. Chartered loss adjuster educated pt that she is not to wt bear on leg but does still need to use it/move it. Max encouragement to at least perform bed level exercises. I will tomorrow.    Rankin [name] 06/26/2024, 11:50 AM

## 2024-06-26 NOTE — Progress Notes (Signed)
 [facility]  - [place] at [facility]   PATIENT NAME: Karen Farley    MR#:  [id number]  DATE OF BIRTH:  02-Nov-1945  SUBJECTIVE:  husband at bedside. Patient complains of left ankle pain and happened after she was trying to get out of the care car and ended up having left distal ankle fracture. She is denied any shortness of breath appears at baseline. Work with physical therapy.    VITALS:  Blood pressure 104/64, pulse 74, temperature 97.7 F (36.5 C), resp. rate 18, height 5' (1.524 m), weight 68.5 kg, SpO2 100%.  PHYSICAL EXAMINATION:   GENERAL:  78 y.o.-year-old patient with no acute distress.  LUNGS: Normal breath sounds bilaterally, no wheezing CARDIOVASCULAR: S1, S2 normal. No murmur   ABDOMEN: Soft, nontender, nondistended.  EXTREMITIES: left ankle splint    NEUROLOGIC: nonfocal  patient is alert and awake   LABORATORY PANEL:  CBC Recent Labs  Lab 06/26/24 0515  WBC 5.8  HGB 13.6  HCT 43.4  PLT 270    Chemistries  Recent Labs  Lab 06/25/24 0330 06/25/24 1621 06/26/24 0515  NA 121*   < > 126*  K 2.7*   < > 3.7  CL 83*   < > 85*  CO2 25   < > 27  GLUCOSE 266*   < > 135*  BUN 86*   < > 76*  CREATININE 2.18*   < > 1.99*  CALCIUM  9.4   < > 9.8  MG 2.5*  --   --    < > = values in this interval not displayed.   Cardiac Enzymes No results for input(s): TROPONINI in the last 168 hours. RADIOLOGY:  DG Ankle Complete Left Result Date: 06/25/2024 CLINICAL DATA:  Injury EXAM: LEFT ANKLE COMPLETE - 3+ VIEW FINDINGS: There is diffuse soft tissue swelling of the visualized lower extremity and ankle. There are 2 well corticated densities adjacent to the medial malleolus which are likely related to old injury. There is an acute oblique nondisplaced fracture of the distal fibula at and above the level of the ankle mortise. Joint spaces are well maintained. IMPRESSION: Acute oblique nondisplaced fracture of the distal fibula. Electronically Signed    By: Greig Pique M.D.   On: 06/25/2024 00:49    Assessment and Plan Karen Farley is a 78 y.o. female with medical history significant of IIDM, HTN, HLD, advanced chronic HFpEF, constrictive pericarditis, chronic A-fib, CKD stage IV, chronic hyponatremia, chronic lymphedema, presented with mechanical fall and left ankle fracture.   At baseline patient uses roller walker to ambulate since last year.  Yesterday, while exiting her car, she tripped left foot between curbside and her car she fell back to her car and twisted left ankle.   x-ray showed acute oblique nondisplaced fracture of the distal fibula   Left distal tibial nondisplaced fracture Secondary to mechanical fall - Nonoperable, got detailed instruction for PT from podiatry [facility] (dr Engineer, production on call--per [facility]), Would prefer she stay off of it as much as possible. Should put most of her weight on the other foot and may put gentle contact on the foot. They could place her in a boot with a compression dressing but she would have to keep the boot on for the most part at all times as it would be acting as the splint/cast.  --prn pain meds _PT/OT--SNF --pt will f/u Dr Lennie as outpt   History of constrictive pericarditis - Was evaluated 2 days ago by cardiology -  cardiology recommended continue current diuresis of torsemide , metolazone  and spironolactone .     Hyponatremia, chronic - Severe volume overload, likely secondary to chronic CHF decompensation.  Baseline sodium level 125 and corrected sodium level today is 125. - will cont po dose of metolazone  and torsemide    CKD stage IV - Creatinine level stable  Chronic Hypotension - Was on midodrine , there were different opinions regarding use of midodrine  between cardiology and nephrology.  Plan to hold midodrine  as well as patient does not have symptoms of hypotension.    PAF - In sinus rhythm - Not on systemic anticoagulation -ASA        DVT prophylaxis: Heparin   subcu Code Status: Full code Family Communication: husband at bedside Level of care: Med-Surg Status is: Inpatient Remains inpatient appropriate because: pain control and TOC for d/c to rehab    TOTAL TIME TAKING CARE OF THIS PATIENT: 35 minutes.  >50% time spent on counselling and coordination of care  Note: This dictation was prepared with Dragon dictation along with smaller phrase technology. Any transcriptional errors that result from this process are unintentional.  [name] M.D    [facility]   CC: Primary care physician; Vicci Duwaine SQUIBB, DO

## 2024-06-26 NOTE — TOC Initial Note (Signed)
 Transition of Care [facility]) - Initial/Assessment Note    Patient Details  Name: Karen Farley MRN: [id number] Date of Birth: October 07, 1946  [facility] CM/SW Contact:    [place] L Genice Kimberlin, LCSW Phone Number: 06/26/2024, 11:36 AM  Clinical Narrative:                  Brief assessment completed. Patient resides in the home with her spouse. Spouse at bedside with patient. Patient had some difficulty responding to questions. Patient asked for her pain medications. CSW sent secure chat to RN to advise.   SNF recommendations discussed. Patient advised that she did not want home health services. She declined PT/OT in the home. She stated that she would like to go to a SNF for rehab.   Choice was discussed. Spouse advised that patient has never been to a SNF before and advised CSW to search for bed offers within the residential zip code.   CSW will review PT and OT recommendations.        Patient Goals and CMS Choice            Expected Discharge Plan and Services                                              Prior Living Arrangements/Services                       Activities of Daily Living   ADL Screening (condition at time of admission) Independently performs ADLs?: No Does the patient have a NEW difficulty with bathing/dressing/toileting/self-feeding that is expected to last >3 days?: No Does the patient have a NEW difficulty with getting in/out of bed, walking, or climbing stairs that is expected to last >3 days?: Yes (Initiates electronic notice to provider for possible PT consult) Does the patient have a NEW difficulty with communication that is expected to last >3 days?: No Is the patient deaf or have difficulty hearing?: No Does the patient have difficulty seeing, even when wearing glasses/contacts?: No Does the patient have difficulty concentrating, remembering, or making decisions?: No  Permission Sought/Granted                   Emotional Assessment              Admission diagnosis:  Unable to ambulate [R26.2] Nondisplaced fracture of lateral malleolus of left fibula, initial encounter for closed fracture [S82.65XA] Ankle fracture [S82.899A] Patient Active Problem List   Diagnosis Date Noted   Unable to ambulate 06/25/2024   Ankle fracture 06/25/2024   Hypotension 03/20/2024   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/20/2024   Acute on chronic diastolic heart failure (HCC) 03/19/2024   HTN (hypertension) 03/19/2024   Acute renal failure superimposed on stage 3b chronic kidney disease (HCC) 03/19/2024   Overweight (BMI 25.0-29.9) 03/19/2024   Myocardial injury 03/19/2024   Atrial fibrillation, chronic (HCC) 03/19/2024   Acute idiopathic gout involving toe 09/30/2023   Senile purpura (HCC) 12/13/2022   Pulmonary hypertension, primary (HCC) 09/07/2021   Pericardial effusion 08/05/2021   Acute blood loss anemia 08/04/2021   Anasarca    Stage 3b chronic kidney disease (HCC) 05/17/2021   Pleural effusion 05/17/2021   Acute on chronic diastolic CHF (congestive heart failure) (HCC) 05/17/2021   Chronic anticoagulation 05/17/2021   Acute kidney injury superimposed on CKD (  HCC) 04/11/2021   Hyponatremia 04/11/2021   Acute on chronic heart failure with preserved ejection fraction (HFpEF) (HCC) 04/11/2021   Chronic venous insufficiency 08/14/2020   PAD (peripheral artery disease) (HCC) 08/14/2020   Bilateral primary osteoarthritis of knee 04/13/2020   Acquired spondylolisthesis 12/17/2019   Lumbar radiculopathy 12/17/2019   Atrial fibrillation (HCC) 11/20/2019   Closed fracture of radial styloid 08/06/2019   Displaced fracture of unspecified radial styloid process, initial encounter for closed fracture 08/06/2019   Controlled substance agreement signed 01/24/2018   Piriformis syndrome 07/01/2016   Sacroiliac joint dysfunction 01/09/2016   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 diabetes mellitus (HCC) 04/28/2015    Lymphedema 03/27/2015   Open-angle glaucoma, mild stage    Osteoarthritis of both knees    Type 2 diabetes mellitus with renal complication (HCC)    Osteopenia    Benign hypertensive renal disease    Hyperlipidemia    Degeneration of lumbosacral intervertebral disc    PCP:  Vicci Duwaine SQUIBB, DO Pharmacy:   [facility], [place] - [address] [place] [address] Phone: [phone] Fax: [phone]  [facility] - [place], [place] - [address] [place] [address] Phone: [phone] Fax: [phone]  [id number] - [place], [place] - [address] [place] [address] Phone: [phone] Fax: [phone]     Social Drivers of Health (SDOH) Social History: SDOH Screenings   Food Insecurity: No Food Insecurity (06/25/2024)  Housing: Low Risk  (06/25/2024)  Transportation Needs: No Transportation Needs (06/25/2024)  Utilities: Not At Risk (06/25/2024)  Alcohol Screen: Low Risk  (07/23/2022)  Depression (PHQ2-9): Low Risk  (12/19/2023)  Financial Resource Strain: Low Risk  (07/23/2022)  Physical Activity: Sufficiently Active (07/23/2022)  Social Connections: Moderately Isolated (06/25/2024)  Stress: No Stress Concern Present (07/23/2022)  Tobacco Use: Low Risk  (06/25/2024)   SDOH Interventions:     Readmission Risk Interventions     No data to display

## 2024-06-27 DIAGNOSIS — S8265XA Nondisplaced fracture of lateral malleolus of left fibula, initial encounter for closed fracture: Secondary | ICD-10-CM | POA: Diagnosis not present

## 2024-06-27 DIAGNOSIS — I5032 Chronic diastolic (congestive) heart failure: Secondary | ICD-10-CM | POA: Diagnosis not present

## 2024-06-27 DIAGNOSIS — I890 Lymphedema, not elsewhere classified: Secondary | ICD-10-CM | POA: Diagnosis not present

## 2024-06-27 LAB — GLUCOSE, CAPILLARY
Glucose-Capillary: 189 mg/dL — ABNORMAL HIGH (ref 70–99)
Glucose-Capillary: 226 mg/dL — ABNORMAL HIGH (ref 70–99)
Glucose-Capillary: 152 mg/dL — ABNORMAL HIGH (ref 70–99)
Glucose-Capillary: 183 mg/dL — ABNORMAL HIGH (ref 70–99)

## 2024-06-27 MED ORDER — EMPAGLIFLOZIN 25 MG PO TABS
25.0000 mg | ORAL_TABLET | Freq: Every day | ORAL | Status: DC
  Administered 2024-06-27 – 2024-06-30 (×4): 25 mg via ORAL
  Filled 2024-06-27 (×4): qty 1

## 2024-06-27 NOTE — Progress Notes (Signed)
 Physical Therapy Treatment Patient Details Name: Karen Farley MRN: [id number] DOB: 09-22-1946 Today's Date: 06/27/2024   History of Present Illness Pt is a 78 year old female who twisted and hit her ankle on car door with resulting acute oblique nondisplaced fracture of the distal fibula. PMH of diastolic CHF, hypertension, hyperlipidemia, diabetes mellitus, PAD, gout, GI bleeding, CKD-3B, A-fib on Eliquis , chronic hyponatremia, chronic venous insufficiency, lymphedema.    PT Comments  Co-tx with OT for improved outcomes.  She is able to get to EOB with rails and min a x 1 with cues.  Steady in sitting today.  Assist to don sock as she is unable but does try.  Stood x 4 during session to RW with significantly improved ability to maintain NWB.TTWB during session.  She is unable to hop or lateral scoot with walker despite cues.  Standing LLE while standing.  Attempted lateral scoot to recliner but she struggled but is able to lateral scoot along EOB with min a x 1 to maintain.  Returned to supine with min a x 1 with assist to change linens.  Overall improved participation and ability this session.  Pt will need continued therapies at discharge.     If plan is discharge home, recommend the following: Two people to help with walking and/or transfers;Two people to help with bathing/dressing/bathroom;Assistance with cooking/housework;Assist for transportation;Help with stairs or ramp for entrance   Can travel by private vehicle        Equipment Recommendations       Recommendations for Other Services       Precautions / Restrictions Precautions Precautions: Fall Recall of Precautions/Restrictions: Impaired Precaution/Restrictions Comments: touch-down WB LLE Required Braces or Orthoses: Splint/Cast Splint/Cast: L ankle splint Restrictions Weight Bearing Restrictions Per Provider Order: Yes LLE Weight Bearing Per Provider Order: Touchdown weight bearing     Mobility  Bed  Mobility Overal bed mobility: Needs Assistance Bed Mobility: Supine to Sit, Sit to Supine     Supine to sit: Min assist, Used rails Sit to supine: Min assist, +2 for safety/equipment     Patient Response: Cooperative  Transfers Overall transfer level: Needs assistance Equipment used: Rolling walker (2 wheels) Transfers: Sit to/from Stand Sit to Stand: Min assist, +2 physical assistance          Lateral/Scoot Transfers: Min assist, +2 physical assistance General transfer comment: uanble to hop or pivot to chair.  lateral scoot attempted but remained difficult for pt    Ambulation/Gait                   Stairs             Wheelchair Mobility     Tilt Bed Tilt Bed Patient Response: Cooperative  Modified Rankin (Stroke Patients Only)       Balance Overall balance assessment: Needs assistance Sitting-balance support: Feet supported, Bilateral upper extremity supported Sitting balance-Leahy Scale: Good     Standing balance support: Reliant on assistive device for balance, Bilateral upper extremity supported Standing balance-Leahy Scale: Poor Standing balance comment: +2 but overall improved today                            Communication Communication Communication: No apparent difficulties  Cognition Arousal: Alert Behavior During Therapy: WFL for tasks assessed/performed, Flat affect   PT - Cognitive impairments: No apparent impairments  Following commands: Intact      Cueing Cueing Techniques: Verbal cues, Tactile cues, Visual cues  Exercises      General Comments        Pertinent Vitals/Pain Pain Assessment Pain Assessment: Faces Faces Pain Scale: Hurts little more Pain Location: ankle Pain Descriptors / Indicators: Sore, Aching Pain Intervention(s): Limited activity within patient's tolerance, Monitored during session, Repositioned, Patient requesting pain meds-RN notified    Home  Living                          Prior Function            PT Goals (current goals can now be found in the care plan section) Progress towards PT goals: Progressing toward goals    Frequency    7X/week      PT Plan      Co-evaluation PT/OT/SLP Co-Evaluation/Treatment: Yes Reason for Co-Treatment: For patient/therapist safety;To address functional/ADL transfers PT goals addressed during session: Mobility/safety with mobility;Proper use of DME OT goals addressed during session: ADL's and self-care      AM-PAC PT 6 Clicks Mobility   Outcome Measure  Help needed turning from your back to your side while in a flat bed without using bedrails?: A Little Help needed moving from lying on your back to sitting on the side of a flat bed without using bedrails?: A Lot Help needed moving to and from a bed to a chair (including a wheelchair)?: A Lot Help needed standing up from a chair using your arms (e.g., wheelchair or bedside chair)?: A Lot Help needed to walk in hospital room?: Total Help needed climbing 3-5 steps with a railing? : Total 6 Click Score: 11    End of Session Equipment Utilized During Treatment: Gait belt Activity Tolerance: Patient tolerated treatment well Patient left: in bed;with call bell/phone within reach;with bed alarm set Nurse Communication: Mobility status PT Visit Diagnosis: Unsteadiness on feet (R26.81);Other abnormalities of gait and mobility (R26.89);Muscle weakness (generalized) (M62.81);Difficulty in walking, not elsewhere classified (R26.2);Pain Pain - Right/Left: Left Pain - part of body: Ankle and joints of foot     Time: 8980-8957 PT Time Calculation (min) (ACUTE ONLY): 23 min  Charges:    $Therapeutic Activity: 8-22 mins PT General Charges $$ ACUTE PT VISIT: 1 Visit                   .[email or web address]

## 2024-06-27 NOTE — Progress Notes (Signed)
 [facility]  - [place] at [facility]   PATIENT NAME: Karen Farley    MR#:  [id number]  DATE OF BIRTH:  Feb 21, 1946  SUBJECTIVE:  husband at bedside. Patient complains of left ankle pain and happened after she was trying to get out of the care car and ended up having left distal ankle fracture. She is denied any shortness of breath appears at baseline. Work with physical therapy. Poor appetite    VITALS:  Blood pressure 108/75, pulse 91, temperature 98.1 F (36.7 C), temperature source Oral, resp. rate 18, height 5' (1.524 m), weight 68.5 kg, SpO2 100%.  PHYSICAL EXAMINATION:   GENERAL:  78 y.o.-year-old patient with no acute distress.  LUNGS: Normal breath sounds bilaterally, no wheezing CARDIOVASCULAR: S1, S2 normal. No murmur   ABDOMEN: Soft, nontender, nondistended.  EXTREMITIES: left ankle splint    NEUROLOGIC: nonfocal  patient is alert and awake   LABORATORY PANEL:  CBC Recent Labs  Lab 06/26/24 0515  WBC 5.8  HGB 13.6  HCT 43.4  PLT 270    Chemistries  Recent Labs  Lab 06/25/24 0330 06/25/24 1621 06/26/24 0515  NA 121*   < > 126*  K 2.7*   < > 3.7  CL 83*   < > 85*  CO2 25   < > 27  GLUCOSE 266*   < > 135*  BUN 86*   < > 76*  CREATININE 2.18*   < > 1.99*  CALCIUM  9.4   < > 9.8  MG 2.5*  --   --    < > = values in this interval not displayed.   Cardiac Enzymes No results for input(s): TROPONINI in the last 168 hours. RADIOLOGY:  No results found.   Assessment and Plan Karen Farley is a 78 y.o. female with medical history significant of IIDM, HTN, HLD, advanced chronic HFpEF, constrictive pericarditis, chronic A-fib, CKD stage IV, chronic hyponatremia, chronic lymphedema, presented with mechanical fall and left ankle fracture.   At baseline patient uses roller walker to ambulate since last year.  Yesterday, while exiting her car, she tripped left foot between curbside and her car she fell back to her car and twisted left  ankle.   x-ray showed acute oblique nondisplaced fracture of the distal fibula   Left distal tibial nondisplaced fracture Secondary to mechanical fall - Nonoperable, got detailed instruction for PT from podiatry [facility] (dr Engineer, production on call--per [facility]), Would prefer she stay off of it as much as possible. Should put most of her weight on the other foot and may put gentle contact on the foot. They could place her in a boot with a compression dressing but she would have to keep the boot on for the most part at all times as it would be acting as the splint/cast.  --prn pain meds _PT/OT--SNF --pt will f/u Dr Lennie as outpt   History of constrictive pericarditis - Was evaluated 2 days ago by cardiology - cardiology recommended continue current diuresis of torsemide , metolazone  and spironolactone .     Hyponatremia, chronic - Severe volume overload, likely secondary to chronic CHF decompensation.  Baseline sodium level 125 and corrected sodium level today is 125. - will cont po dose of metolazone  and torsemide    CKD stage IV - Creatinine level stable  Chronic Hypotension - Was on midodrine , there were different opinions regarding use of midodrine  between cardiology and nephrology.  Plan to hold midodrine  as well as patient does not have symptoms of hypotension.  PAF - In sinus rhythm - Not on systemic anticoagulation -ASA    Dm-2, hyperglycemia --A1c 7.6% in June 2025 --start Jardiance  for now  [employer] for d/c planning to rehab --pt was on home Rybelsus , jardiance  and Toujeo     DVT prophylaxis: Heparin  subcu Code Status: Full code Family Communication: husband at bedside Level of care: Med-Surg Status is: Inpatient Remains inpatient appropriate because: pain control and TOC for d/c to rehab    TOTAL TIME TAKING CARE OF THIS PATIENT: 35 minutes.  >50% time spent on counselling and coordination of care  Note: This dictation was prepared with Dragon dictation along with smaller phrase  technology. Any transcriptional errors that result from this process are unintentional.  [name] M.D    [facility]   CC: Primary care physician; Vicci Duwaine SQUIBB, DO

## 2024-06-27 NOTE — Progress Notes (Signed)
 Occupational Therapy Treatment Patient Details Name: Karen Farley MRN: [id number] DOB: 1946-03-21 Today's Date: 06/27/2024   History of present illness Pt is a 78 year old female who twisted and hit her ankle on car door with resulting acute oblique nondisplaced fracture of the distal fibula. PMH of diastolic CHF, hypertension, hyperlipidemia, diabetes mellitus, PAD, gout, GI bleeding, CKD-3B, A-fib on Eliquis , chronic hyponatremia, chronic venous insufficiency, lymphedema.   OT comments  Pt is supine in bed on arrival. Pleasant and agreeable to PT/OT co-treatment session. She continues to have pain in L ankle and swelling to RLE-both elevated at end of session. Pt performed supine to sit at EOB with Min A, cues for hand placement and use of bed rail. Multiple STS trials during session to RW requiring Min A x2 and cues for NWB status with improvement noted in carryover. She was not able to progress to pivot/hop to chair, therefore attempted lateral scoots, however d/t increased time/difficulty opted not to get in recliner. Pt required Min A x2 for lateral scoots. Max A needed for LB dressing tasks to change underwear and donn R sock. Min A x2 needed to return safely to supine and reposition in sidelying to promote pressure relief on buttocks. Pt returned to bed with all needs in place and will cont to require skilled acute OT services to maximize her safety and IND to return to PLOF.       If plan is discharge home, recommend the following:  A lot of help with bathing/dressing/bathroom;Two people to help with walking and/or transfers;Help with stairs or ramp for entrance;Assist for transportation;Assistance with cooking/housework   Equipment Recommendations  Other (comment) (defer to next venue)    Recommendations for Other Services      Precautions / Restrictions Precautions Precautions: Fall Recall of Precautions/Restrictions: Impaired Precaution/Restrictions Comments: touch-down WB  LLE Required Braces or Orthoses: Splint/Cast Splint/Cast: L ankle splint Restrictions Weight Bearing Restrictions Per Provider Order: Yes LLE Weight Bearing Per Provider Order: Touchdown weight bearing       Mobility Bed Mobility Overal bed mobility: Needs Assistance Bed Mobility: Supine to Sit, Sit to Supine     Supine to sit: Min assist, Used rails     General bed mobility comments: intiated movement to reach EOB with cues for bed rails use able to elevate trunk, but needs assist with BLEs; x2 assist needed to return safely to supine at end of session    Transfers Overall transfer level: Needs assistance Equipment used: Rolling walker (2 wheels) Transfers: Sit to/from Stand Sit to Stand: Min assist, +2 physical assistance          Lateral/Scoot Transfers: Min assist, +2 physical assistance General transfer comment: pt requiring Min A x2 for STS from EOB to RW with cues for maintaing weight bearing which improved this date; pt unable to hop or pivot to recliner, therefore wokring on lateral scoots which required Min A and increased time to complete and was unable to get to chair this date as nursing likely unable to get her back to bed; pt engaged in 3-4 STS trials this session with Min A x2 for ADL management and improving standing tolerance     Balance Overall balance assessment: Needs assistance Sitting-balance support: Feet supported, Bilateral upper extremity supported Sitting balance-Leahy Scale: Good     Standing balance support: Reliant on assistive device for balance, Bilateral upper extremity supported Standing balance-Leahy Scale: Poor Standing balance comment: continues to require +2, but improved  ADL either performed or assessed with clinical judgement   ADL Overall ADL's : Needs assistance/impaired                 Upper Body Dressing : Minimal assistance;Bed level;Moderate assistance Upper Body Dressing  Details (indicate cue type and reason): changed soiled gown once returned to supine with Min/Mod A Lower Body Dressing: Maximal assistance;Sit to/from stand;Sitting/lateral leans Lower Body Dressing Details (indicate cue type and reason): doff/donn mesh underwear and L sock seated at EOB/standing                    Extremity/Trunk Assessment              Vision       Perception     Praxis     Communication Communication Communication: No apparent difficulties   Cognition Arousal: Alert Behavior During Therapy: WFL for tasks assessed/performed, Flat affect                                 Following commands: Intact        Cueing   Cueing Techniques: Verbal cues, Tactile cues, Visual cues  Exercises      Shoulder Instructions       General Comments      Pertinent Vitals/ Pain       Pain Assessment Pain Assessment: Faces Faces Pain Scale: Hurts little more Pain Location: ankle Pain Descriptors / Indicators: Sore, Aching Pain Intervention(s): Limited activity within patient's tolerance, Monitored during session, Repositioned, Patient requesting pain meds-RN notified  Home Living                                          Prior Functioning/Environment              Frequency  Min 2X/week        Progress Toward Goals  OT Goals(current goals can now be found in the care plan section)  Progress towards OT goals: Progressing toward goals  Acute Rehab OT Goals Patient Stated Goal: improve function OT Goal Formulation: With patient Time For Goal Achievement: 07/09/24 Potential to Achieve Goals: Fair  Plan      Co-evaluation    PT/OT/SLP Co-Evaluation/Treatment: Yes Reason for Co-Treatment: For patient/therapist safety;To address functional/ADL transfers PT goals addressed during session: Mobility/safety with mobility;Proper use of DME OT goals addressed during session: ADL's and self-care      AM-PAC OT 6  Clicks Daily Activity     Outcome Measure   Help from another person eating meals?: None Help from another person taking care of personal grooming?: None Help from another person toileting, which includes using toliet, bedpan, or urinal?: A Lot Help from another person bathing (including washing, rinsing, drying)?: A Lot Help from another person to put on and taking off regular upper body clothing?: A Little Help from another person to put on and taking off regular lower body clothing?: A Lot 6 Click Score: 17    End of Session Equipment Utilized During Treatment: Gait belt;Rolling walker (2 wheels)  OT Visit Diagnosis: Other abnormalities of gait and mobility (R26.89);Muscle weakness (generalized) (M62.81);Unsteadiness on feet (R26.81)   Activity Tolerance Patient tolerated treatment well   Patient Left in bed;with call bell/phone within reach;with bed alarm set   Nurse Communication  Time: 8978-8956 OT Time Calculation (min): 22 min  Charges: OT General Charges $OT Visit: 1 Visit OT Treatments $Self Care/Home Management : 8-22 mins  Darah Simkin, OTR/L  06/27/24, 1:19 PM   Davene Jobin E Jamus Loving 06/27/2024, 1:16 PM

## 2024-06-27 NOTE — Plan of Care (Signed)

## 2024-06-28 ENCOUNTER — Inpatient Hospital Stay

## 2024-06-28 DIAGNOSIS — I890 Lymphedema, not elsewhere classified: Secondary | ICD-10-CM | POA: Diagnosis not present

## 2024-06-28 DIAGNOSIS — S8265XA Nondisplaced fracture of lateral malleolus of left fibula, initial encounter for closed fracture: Principal | ICD-10-CM

## 2024-06-28 DIAGNOSIS — I5032 Chronic diastolic (congestive) heart failure: Secondary | ICD-10-CM

## 2024-06-28 LAB — BASIC METABOLIC PANEL WITH GFR
Anion gap: 17 — ABNORMAL HIGH (ref 5–15)
BUN: 79 mg/dL — ABNORMAL HIGH (ref 8–23)
CO2: 27 mmol/L (ref 22–32)
Calcium: 9.7 mg/dL (ref 8.9–10.3)
Chloride: 83 mmol/L — ABNORMAL LOW (ref 98–111)
Creatinine, Ser: 2.05 mg/dL — ABNORMAL HIGH (ref 0.44–1.00)
GFR, Estimated: 24 mL/min — ABNORMAL LOW (ref >60)
Glucose, Bld: 158 mg/dL — ABNORMAL HIGH (ref 70–99)
Potassium: 2.6 mmol/L — CL (ref 3.5–5.1)
Sodium: 127 mmol/L — ABNORMAL LOW (ref 135–145)

## 2024-06-28 LAB — GLUCOSE, CAPILLARY
Glucose-Capillary: 155 mg/dL — ABNORMAL HIGH (ref 70–99)
Glucose-Capillary: 316 mg/dL — ABNORMAL HIGH (ref 70–99)
Glucose-Capillary: 143 mg/dL — ABNORMAL HIGH (ref 70–99)
Glucose-Capillary: 155 mg/dL — ABNORMAL HIGH (ref 70–99)

## 2024-06-28 MED ORDER — INSULIN ASPART 100 UNIT/ML IJ SOLN
3.0000 [IU] | Freq: Three times a day (TID) | INTRAMUSCULAR | Status: DC
  Administered 2024-06-28 – 2024-06-30 (×6): 3 [IU] via SUBCUTANEOUS
  Filled 2024-06-28 (×6): qty 1

## 2024-06-28 MED ORDER — POTASSIUM CHLORIDE 10 MEQ/100ML IV SOLN
10.0000 meq | INTRAVENOUS | Status: AC
  Administered 2024-06-28 (×4): 10 meq via INTRAVENOUS
  Filled 2024-06-28 (×4): qty 100

## 2024-06-28 MED ORDER — POLYETHYLENE GLYCOL 3350 17 G PO PACK
17.0000 g | PACK | Freq: Two times a day (BID) | ORAL | Status: DC
  Administered 2024-06-28 – 2024-06-30 (×4): 17 g via ORAL
  Filled 2024-06-28 (×5): qty 1

## 2024-06-28 MED ORDER — BISACODYL 10 MG RE SUPP
10.0000 mg | Freq: Once | RECTAL | Status: AC
  Administered 2024-06-28: 10 mg via RECTAL
  Filled 2024-06-28: qty 1

## 2024-06-28 MED ORDER — ORAL CARE MOUTH RINSE: 15.0000 mL | OROMUCOSAL | Status: DC | PRN

## 2024-06-28 MED ORDER — POTASSIUM CHLORIDE CRYS ER 20 MEQ PO TBCR
20.0000 meq | EXTENDED_RELEASE_TABLET | Freq: Every day | ORAL | Status: DC
  Administered 2024-06-29 – 2024-06-30 (×2): 20 meq via ORAL
  Filled 2024-06-28 (×2): qty 1

## 2024-06-28 MED ORDER — POTASSIUM CHLORIDE CRYS ER 20 MEQ PO TBCR
40.0000 meq | EXTENDED_RELEASE_TABLET | Freq: Once | ORAL | Status: AC
  Administered 2024-06-28: 40 meq via ORAL
  Filled 2024-06-28: qty 2

## 2024-06-28 NOTE — TOC Progression Note (Signed)
 Transition of Care [facility]) - Progression Note    Patient Details  Name: Karen Farley MRN: [id number] Date of Birth: 1946/05/14  [facility] of [facility]) CM/SW Contact  Corean ONEIDA Haddock, RN Phone Number: 06/28/2024, 3:48 PM  Clinical Narrative:     Existing PASRR Fl2 sent for signature Bed search initiated                     Expected Discharge Plan and Services                                               Social Drivers of Health (SDOH) Interventions SDOH Screenings   Food Insecurity: No Food Insecurity (06/25/2024)  Housing: Low Risk  (06/25/2024)  Transportation Needs: No Transportation Needs (06/25/2024)  Utilities: Not At Risk (06/25/2024)  Alcohol Screen: Low Risk  (07/23/2022)  Depression (PHQ2-9): Low Risk  (12/19/2023)  Financial Resource Strain: Low Risk  (07/23/2022)  Physical Activity: Sufficiently Active (07/23/2022)  Social Connections: Moderately Isolated (06/25/2024)  Stress: No Stress Concern Present (07/23/2022)  Tobacco Use: Low Risk  (06/25/2024)    Readmission Risk Interventions     No data to display

## 2024-06-28 NOTE — Progress Notes (Signed)
 Physical Therapy Treatment Patient Details Name: Karen Farley MRN: [id number] DOB: 29-Jul-1946 Today's Date: 06/28/2024   History of Present Illness Pt is a 78 year old female who twisted and hit her ankle on car door with resulting acute oblique nondisplaced fracture of the distal fibula. PMH of diastolic CHF, hypertension, hyperlipidemia, diabetes mellitus, PAD, gout, GI bleeding, CKD-3B, A-fib on Eliquis , chronic hyponatremia, chronic venous insufficiency, lymphedema.    PT Comments  Pt in room with RN.  Well pain medicated prior to session due to uncontrolled pain this am.  She is willing to try session.  Transitions to sitting with min a x 1 for LE management.  She is able to sit about 5 minutes before needing to lay back down.  She is steady in sitting but as session progresses she does seem more affected by medication I'm not making any sense  while trying to answer questions.  Repositioned for comfort.    Of note, during mobility, it is noticed that abdomen is quite firm and distended.  Did not notice it to this degree yesterday.  Discussed with MD and RN to address. Pt denies pain and nausea.     If plan is discharge home, recommend the following: Two people to help with walking and/or transfers;Two people to help with bathing/dressing/bathroom;Assistance with cooking/housework;Assist for transportation;Help with stairs or ramp for entrance   Can travel by private vehicle        Equipment Recommendations       Recommendations for Other Services       Precautions / Restrictions Precautions Precautions: Fall Recall of Precautions/Restrictions: Impaired Precaution/Restrictions Comments: touch-down WB LLE Required Braces or Orthoses: Splint/Cast Splint/Cast: L ankle splint Restrictions Weight Bearing Restrictions Per Provider Order: Yes LLE Weight Bearing Per Provider Order: Touchdown weight bearing     Mobility  Bed Mobility Overal bed mobility: Needs Assistance Bed  Mobility: Supine to Sit, Sit to Supine     Supine to sit: Min assist Sit to supine: Min assist     Patient Response: Cooperative  Transfers                   General transfer comment: deferred today    Ambulation/Gait                   Stairs             Wheelchair Mobility     Tilt Bed Tilt Bed Patient Response: Cooperative  Modified Rankin (Stroke Patients Only)       Balance Overall balance assessment: Needs assistance Sitting-balance support: Feet supported, Bilateral upper extremity supported Sitting balance-Leahy Scale: Good         Standing balance comment: deferred                            Communication Communication Communication: No apparent difficulties  Cognition Arousal: Alert Behavior During Therapy: WFL for tasks assessed/performed, Flat affect   PT - Cognitive impairments: No apparent impairments                       PT - Cognition Comments: somewhat lethargic and difficulty answering questions by end of session - related to meds. Following commands: Intact      Cueing Cueing Techniques: Verbal cues, Tactile cues, Visual cues  Exercises      General Comments        Pertinent Vitals/Pain Pain Assessment Pain Assessment: Faces  Faces Pain Scale: Hurts a little bit Pain Location: ankle - improved with multiple pain meds prior Pain Descriptors / Indicators: Sore, Aching Pain Intervention(s): Limited activity within patient's tolerance, Monitored during session    Home Living                          Prior Function            PT Goals (current goals can now be found in the care plan section) Progress towards PT goals: Progressing toward goals    Frequency    7X/week      PT Plan      Co-evaluation              AM-PAC PT 6 Clicks Mobility   Outcome Measure  Help needed turning from your back to your side while in a flat bed without using bedrails?: A  Little Help needed moving from lying on your back to sitting on the side of a flat bed without using bedrails?: A Little Help needed moving to and from a bed to a chair (including a wheelchair)?: A Lot Help needed standing up from a chair using your arms (e.g., wheelchair or bedside chair)?: A Lot Help needed to walk in hospital room?: Total Help needed climbing 3-5 steps with a railing? : Total 6 Click Score: 12    End of Session Equipment Utilized During Treatment: Gait belt Activity Tolerance: Patient tolerated treatment well;Treatment limited secondary to medical complications (Comment) Patient left: in bed;with call bell/phone within reach;with bed alarm set Nurse Communication: Mobility status PT Visit Diagnosis: Unsteadiness on feet (R26.81);Other abnormalities of gait and mobility (R26.89);Muscle weakness (generalized) (M62.81);Difficulty in walking, not elsewhere classified (R26.2);Pain Pain - Right/Left: Left Pain - part of body: Ankle and joints of foot     Time: 1240-1303 PT Time Calculation (min) (ACUTE ONLY): 23 min  Charges:    $Therapeutic Activity: 23-37 mins PT General Charges $$ ACUTE PT VISIT: 1 Visit                   Lauraine Gills, PTA 06/28/24, 1:21 PM

## 2024-06-28 NOTE — Plan of Care (Signed)
 Patient vital stable.  Abdomen was getting distended, gave suppository and had medium BM.  Pain has been better in the afternoon.

## 2024-06-28 NOTE — NC FL2 (Signed)
 [place]  MEDICAID FL2 LEVEL OF CARE FORM     IDENTIFICATION  Patient Name: Karen Farley Birthdate: 1946/10/16 Sex: female Admission Date (Current Location): 06/25/2024  [facility] and [place] Number:  Chiropodist and Address:         Provider Number: [id number]  Attending Physician Name and Address:  Tobie Calix, MD  Relative Name and Phone Number:       Current Level of Care: Hospital Recommended Level of Care: Skilled Nursing Facility Prior Approval Number:    Date Approved/Denied:   PASRR Number: [id number]  Discharge Plan: SNF    Current Diagnoses: Patient Active Problem List   Diagnosis Date Noted   Nondisplaced fracture of lateral malleolus of left fibula, initial encounter for closed fracture 06/26/2024   Chronic diastolic CHF (congestive heart failure) (HCC) 06/26/2024   Unable to ambulate 06/25/2024   Ankle fracture 06/25/2024   Hypotension 03/20/2024   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/20/2024   Acute on chronic diastolic heart failure (HCC) 03/19/2024   HTN (hypertension) 03/19/2024   Acute renal failure superimposed on stage 3b chronic kidney disease (HCC) 03/19/2024   Overweight (BMI 25.0-29.9) 03/19/2024   Myocardial injury 03/19/2024   Atrial fibrillation, chronic (HCC) 03/19/2024   Acute idiopathic gout involving toe 09/30/2023   Senile purpura (HCC) 12/13/2022   Pulmonary hypertension, primary (HCC) 09/07/2021   Pericardial effusion 08/05/2021   Acute blood loss anemia 08/04/2021   Anasarca    Stage 3b chronic kidney disease (HCC) 05/17/2021   Pleural effusion 05/17/2021   Acute on chronic diastolic CHF (congestive heart failure) (HCC) 05/17/2021   Chronic anticoagulation 05/17/2021   Acute kidney injury superimposed on CKD (HCC) 04/11/2021   Hyponatremia 04/11/2021   Acute on chronic heart failure with preserved ejection fraction (HFpEF) (HCC) 04/11/2021   Chronic venous insufficiency 08/14/2020   PAD (peripheral artery  disease) (HCC) 08/14/2020   Bilateral primary osteoarthritis of knee 04/13/2020   Acquired spondylolisthesis 12/17/2019   Lumbar radiculopathy 12/17/2019   Atrial fibrillation (HCC) 11/20/2019   Closed fracture of radial styloid 08/06/2019   Displaced fracture of unspecified radial styloid process, initial encounter for closed fracture 08/06/2019   Controlled substance agreement signed 01/24/2018   Piriformis syndrome 07/01/2016   Sacroiliac joint dysfunction 01/09/2016   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 diabetes mellitus (HCC) 04/28/2015   Lymphedema 03/27/2015   Open-angle glaucoma, mild stage    Osteoarthritis of both knees    Type 2 diabetes mellitus with renal complication (HCC)    Osteopenia    Benign hypertensive renal disease    Hyperlipidemia    Degeneration of lumbosacral intervertebral disc     Orientation RESPIRATION BLADDER Height & Weight     Self, Time, Situation, Place  Normal Continent Weight: 68.5 kg Height:  5' (152.4 cm)  BEHAVIORAL SYMPTOMS/MOOD NEUROLOGICAL BOWEL NUTRITION STATUS      Continent Diet (heatrt healthy)  AMBULATORY STATUS COMMUNICATION OF NEEDS Skin   Extensive Assist Verbally Bruising                       Personal Care Assistance Level of Assistance              Functional Limitations Info             SPECIAL CARE FACTORS FREQUENCY  OT (By licensed OT), PT (By licensed PT)                    Contractures  Contractures Info: Not present    Additional Factors Info  Code Status, Allergies Code Status Info: Full Allergies Info: NKDA           Current Medications (06/28/2024):  This is the current hospital active medication list Current Facility-Administered Medications  Medication Dose Route Frequency Provider Last Rate Last Admin   acetaminophen  (TYLENOL ) tablet 650 mg  650 mg Oral Q6H PRN Laurita Manor T, MD   650 mg at 06/26/24 9066   Or   acetaminophen  (TYLENOL ) suppository 650 mg  650 mg Rectal Q6H PRN Laurita Manor  T, MD       allopurinol  (ZYLOPRIM ) tablet 100 mg  100 mg Oral Daily Laurita Manor T, MD   100 mg at 06/28/24 0901   aspirin  EC tablet 81 mg  81 mg Oral Daily Zhang, Ping T, MD   81 mg at 06/28/24 0900   bisacodyl  (DULCOLAX) EC tablet 5 mg  5 mg Oral Daily PRN Laurita Manor T, MD       empagliflozin  (JARDIANCE ) tablet 25 mg  25 mg Oral Daily Patel, Sona, MD   25 mg at 06/28/24 0901   heparin  injection 5,000 Units  5,000 Units Subcutaneous Q12H Laurita Manor T, MD   5,000 Units at 06/28/24 0901   HYDROmorphone  (DILAUDID ) injection 0.5-1 mg  0.5-1 mg Intravenous Q4H PRN Patel, Sona, MD   0.5 mg at 06/28/24 1204   insulin  aspart (novoLOG ) injection 0-9 Units  0-9 Units Subcutaneous TID WC Laurita Manor T, MD   7 Units at 06/28/24 1227   latanoprost  (XALATAN ) 0.005 % ophthalmic solution 1 drop  1 drop Both Eyes QHS Laurita Manor T, MD   1 drop at 06/27/24 2157   metolazone  (ZAROXOLYN ) tablet 5 mg  5 mg Oral Daily Zhang, Ping T, MD   5 mg at 06/28/24 0900   ondansetron  (ZOFRAN ) tablet 4 mg  4 mg Oral Q6H PRN Laurita Manor DASEN, MD       Or   ondansetron  (ZOFRAN ) injection 4 mg  4 mg Intravenous Q6H PRN Laurita Manor DASEN, MD       Oral care mouth rinse  15 mL Mouth Rinse PRN Tobie Calix, MD       oxyCODONE  (Oxy IR/ROXICODONE ) immediate release tablet 5 mg  5 mg Oral Q6H PRN Laurita Manor T, MD   5 mg at 06/28/24 0859   pantoprazole  (PROTONIX ) EC tablet 40 mg  40 mg Oral BID Zhang, Ping T, MD   40 mg at 06/28/24 0900   polyethylene glycol (MIRALAX  / GLYCOLAX ) packet 17 g  17 g Oral BID Patel, Sona, MD   17 g at 06/28/24 1407   [START ON 06/29/2024] potassium chloride  SA (KLOR-CON  M) CR tablet 20 mEq  20 mEq Oral Daily Patel, Sona, MD       senna-docusate (Senokot-S) tablet 1 tablet  1 tablet Oral QHS PRN Laurita Manor T, MD   1 tablet at 06/28/24 1152   spironolactone  (ALDACTONE ) tablet 50 mg  50 mg Oral Daily Zhang, Ping T, MD   50 mg at 06/28/24 0900   torsemide  (DEMADEX ) tablet 40 mg  40 mg Oral Daily Patel, Sona, MD   40 mg  at 06/28/24 0900     Discharge Medications: Please see discharge summary for a list of discharge medications.  Relevant Imaging Results:  Relevant Lab Results:   Additional Information [id number]  Corean DASEN Haddock, RN

## 2024-06-28 NOTE — Inpatient Diabetes Management (Signed)
 Inpatient Diabetes Program Recommendations  AACE/ADA: New Consensus Statement on Inpatient Glycemic Control   Target Ranges:  Prepandial:   less than 140 mg/dL      Peak postprandial:   less than 180 mg/dL (1-2 hours)      Critically ill patients:  140 - 180 mg/dL     Latest Reference Range & Units 06/27/24 08:14 06/27/24 11:48 06/27/24 16:11 06/27/24 21:18 06/28/24 08:26 06/28/24 12:20  Glucose-Capillary 70 - 99 mg/dL 810 (H) 773 (H) 847 (H) 183 (H) 155 (H) 316 (H)   Review of Glycemic Control  Diabetes history: DM2 Outpatient Diabetes medications: None listed; per d/c summary on 04/03/24, pt was discharged home with hospice and Toujeo , Jardiance , and Rybelsus  was stopped Current orders for Inpatient glycemic control: Novolog  0-9 units TID with meals, Jardicane 25 mg daily  Inpatient Diabetes Program Recommendations:    Insulin : May want to consider ordering Novolog  2 units TID with meals for meal coverage if patient eats at least 50% of meals.  Thanks, Earnie Gainer, RN, MSN, CDCES Diabetes Coordinator Inpatient Diabetes Program [phone] (Team Pager from 8am to 5pm)

## 2024-06-28 NOTE — Progress Notes (Addendum)
 Occupational Therapy Treatment Patient Details Name: Karen Farley MRN: [id number] DOB: 01-14-1946 Today's Date: 06/28/2024   History of present illness Pt is a 78 year old female who twisted and hit her ankle on car door with resulting acute oblique nondisplaced fracture of the distal fibula. PMH of diastolic CHF, hypertension, hyperlipidemia, diabetes mellitus, PAD, gout, GI bleeding, CKD-3B, A-fib on Eliquis , chronic hyponatremia, chronic venous insufficiency, lymphedema.   OT comments  Pt is seated at EOB with husband present with BSC in front as OT walked by room. Per husband, pt needing to have a BM urgently. She does not report pain during session. Pt performed STS from EOB and BSC with Mod A x1 to stand fully upright with LLE placed on therapist's foot to prevent WB. Pt is able to maintain NWB well in standing, however upon attempts to SPT towards BSC and back to bed she is noted to have increased difficulty maintaining NWB and requires Max A x2 to perform safely. On 2nd attempt, pt requiring bed to pulled over to sit on to prevent further weight bearing. She required Min A to manage BLEs back into supine position. Pt left with all needs in place and will cont to require skilled acute OT services to maximize her safety and IND to return to PLOF.        If plan is discharge home, recommend the following:  A lot of help with bathing/dressing/bathroom;Two people to help with walking and/or transfers;Help with stairs or ramp for entrance;Assist for transportation;Assistance with cooking/housework   Equipment Recommendations  Other (comment) (defer)    Recommendations for Other Services      Precautions / Restrictions Precautions Precautions: Fall Recall of Precautions/Restrictions: Impaired Precaution/Restrictions Comments: touch-down WB LLE Required Braces or Orthoses: Splint/Cast Splint/Cast: L ankle splint Restrictions Weight Bearing Restrictions Per Provider Order: Yes LLE  Weight Bearing Per Provider Order: Touchdown weight bearing       Mobility Bed Mobility Overal bed mobility: Needs Assistance Bed Mobility: Sit to Supine       Sit to supine: Min assist   General bed mobility comments: BLE management to return to supine    Transfers Overall transfer level: Needs assistance Equipment used: Rolling walker (2 wheels) Transfers: Sit to/from Stand, Bed to chair/wheelchair/BSC Sit to Stand: Min assist, Mod assist     Step pivot transfers: Max assist, +2 physical assistance     General transfer comment: able to stand with Min A x1 from BSC and EOB, however needs Max A x2 to SPT from bed<>BSC with BSC moved and bed pull under pt to return d/t fatigue and inability to maintain NWB precautions     Balance Overall balance assessment: Needs assistance Sitting-balance support: Feet supported, Bilateral upper extremity supported Sitting balance-Leahy Scale: Good     Standing balance support: Reliant on assistive device for balance, Bilateral upper extremity supported Standing balance-Leahy Scale: Poor Standing balance comment: with increased time/fatigue needs Min/Mod A d/t inability to maintain WB precautions                           ADL either performed or assessed with clinical judgement   ADL Overall ADL's : Needs assistance/impaired                         Toilet Transfer: Maximal assistance;+2 for physical assistance;BSC/3in1;Stand-pivot   Toileting- Clothing Manipulation and Hygiene: Total assistance;Sit to/from stand Toileting - Architect Details (indicate  cue type and reason): in standing at RW with good WB adherence       General ADL Comments: pt able to adhere to NWB in LLE with standing only, however with increased time/fatigue, unable to pivot on RLE to perform SPT bed<>BSC without Max A x2    Extremity/Trunk Assessment              Vision       Perception     Praxis      Communication Communication Communication: No apparent difficulties   Cognition Arousal: Alert Behavior During Therapy: WFL for tasks assessed/performed, Flat affect                                 Following commands: Intact        Cueing   Cueing Techniques: Verbal cues, Tactile cues, Visual cues  Exercises      Shoulder Instructions       General Comments      Pertinent Vitals/ Pain       Pain Assessment Pain Assessment: Faces Faces Pain Scale: Hurts a little bit Pain Location: ankle Pain Descriptors / Indicators: Sore, Aching Pain Intervention(s): Limited activity within patient's tolerance, Monitored during session, Repositioned  Home Living                                          Prior Functioning/Environment              Frequency  Min 2X/week        Progress Toward Goals  OT Goals(current goals can now be found in the care plan section)  Progress towards OT goals: Progressing toward goals  Acute Rehab OT Goals Patient Stated Goal: improve function OT Goal Formulation: With patient Time For Goal Achievement: 07/09/24 Potential to Achieve Goals: Fair  Plan      Co-evaluation                 AM-PAC OT 6 Clicks Daily Activity     Outcome Measure   Help from another person eating meals?: None Help from another person taking care of personal grooming?: None Help from another person toileting, which includes using toliet, bedpan, or urinal?: A Lot Help from another person bathing (including washing, rinsing, drying)?: A Lot Help from another person to put on and taking off regular upper body clothing?: A Little Help from another person to put on and taking off regular lower body clothing?: A Lot 6 Click Score: 17    End of Session Equipment Utilized During Treatment: Gait belt;Rolling walker (2 wheels)  OT Visit Diagnosis: Other abnormalities of gait and mobility (R26.89);Muscle weakness  (generalized) (M62.81);Unsteadiness on feet (R26.81)   Activity Tolerance Patient tolerated treatment well   Patient Left in bed;with call bell/phone within reach;with bed alarm set   Nurse Communication Mobility status        Time: 8363-8340 OT Time Calculation (min): 23 min  Charges: OT General Charges $OT Visit: 1 Visit OT Treatments $Self Care/Home Management : 23-37 mins  Dezmon Conover, OTR/L  06/28/24, 5:32 PM   Karen Farley 06/28/2024, 5:32 PM

## 2024-06-28 NOTE — Progress Notes (Signed)
 [facility]  - [place] at [facility]   PATIENT NAME: Karen Farley    MR#:  [id number]  DATE OF BIRTH:  01/24/1946  SUBJECTIVE:  husband at bedside. Patient complains of left ankle pain and happened after she was trying to get out of the care car and ended up having left distal ankle fracture. She is denied any shortness of breath appears at baseline. Worked with physical therapy.   VITALS:  Blood pressure 93/76, pulse 95, temperature 97.8 F (36.6 C), resp. rate 19, height 5' (1.524 m), weight 68.5 kg, SpO2 98%.  PHYSICAL EXAMINATION:   GENERAL:  78 y.o.-year-old patient with no acute distress.  LUNGS: Normal breath sounds bilaterally, no wheezing CARDIOVASCULAR: S1, S2 normal. No murmur   ABDOMEN: Soft, nontender, nondistended.  EXTREMITIES: left ankle splint    NEUROLOGIC: nonfocal  patient is alert and awake   LABORATORY PANEL:  CBC Recent Labs  Lab 06/26/24 0515  WBC 5.8  HGB 13.6  HCT 43.4  PLT 270    Chemistries  Recent Labs  Lab 06/25/24 0330 06/25/24 1621 06/28/24 0812  NA 121*   < > 127*  K 2.7*   < > 2.6*  CL 83*   < > 83*  CO2 25   < > 27  GLUCOSE 266*   < > 158*  BUN 86*   < > 79*  CREATININE 2.18*   < > 2.05*  CALCIUM  9.4   < > 9.7  MG 2.5*  --   --    < > = values in this interval not displayed.   Cardiac Enzymes No results for input(s): TROPONINI in the last 168 hours. RADIOLOGY:  No results found.   Assessment and Plan Karen Farley is a 78 y.o. female with medical history significant of IIDM, HTN, HLD, advanced chronic HFpEF, constrictive pericarditis, chronic A-fib, CKD stage IV, chronic hyponatremia, chronic lymphedema, presented with mechanical fall and left ankle fracture.   At baseline patient uses roller walker to ambulate since last year.  Yesterday, while exiting her car, she tripped left foot between curbside and her car she fell back to her car and twisted left ankle.   x-ray showed acute oblique  nondisplaced fracture of the distal fibula   Left distal tibial nondisplaced fracture Secondary to mechanical fall - Nonoperable, got detailed instruction for PT from podiatry [facility] (dr Engineer, production on call--per [facility]), Would prefer she stay off of it as much as possible. Should put most of her weight on the other foot and may put gentle contact on the foot. They could place her in a boot with a compression dressing but she would have to keep the boot on for the most part at all times as it would be acting as the splint/cast.  --prn pain meds _PT/OT--SNF --pt will f/u Dr Lennie as outpt   History of constrictive pericarditis - Was evaluated 2 days ago by cardiology - cardiology recommended continue current diuresis of torsemide , metolazone  and spironolactone .     Hyponatremia, chronic - Severe volume overload, likely secondary to chronic CHF decompensation.  Baseline sodium level 125 and corrected sodium level today is 125. - will cont po dose of metolazone  and torsemide   Hypokalemia --replace with IV and PO   CKD stage IV - Creatinine level stable  Chronic Hypotension - Was on midodrine , there were different opinions regarding use of midodrine  between cardiology and nephrology.  Plan to hold midodrine  as well as patient does not have symptoms of hypotension.  PAF - In sinus rhythm - Not on systemic anticoagulation -ASA    Dm-2, hyperglycemia --A1c 7.6% in June 2025 --start Jardiance  for now --pt was on home Rybelsus , jardiance  and Toujeo   TOC for d/c planning to rehab   DVT prophylaxis: Heparin  subcu Code Status: Full code Family Communication: husband at bedside Level of care: Med-Surg Status is: Inpatient Remains inpatient appropriate because: replace K IV and PO and TOC for d/c to rehab    TOTAL TIME TAKING CARE OF THIS PATIENT: 35 minutes.  >50% time spent on counselling and coordination of care  Note: This dictation was prepared with Dragon dictation along with smaller  phrase technology. Any transcriptional errors that result from this process are unintentional.  [name] M.D    [facility]   CC: Primary care physician; Vicci Duwaine SQUIBB, DO

## 2024-06-29 DIAGNOSIS — I890 Lymphedema, not elsewhere classified: Secondary | ICD-10-CM | POA: Diagnosis not present

## 2024-06-29 DIAGNOSIS — S8265XA Nondisplaced fracture of lateral malleolus of left fibula, initial encounter for closed fracture: Secondary | ICD-10-CM | POA: Diagnosis not present

## 2024-06-29 DIAGNOSIS — I5032 Chronic diastolic (congestive) heart failure: Secondary | ICD-10-CM | POA: Diagnosis not present

## 2024-06-29 LAB — BASIC METABOLIC PANEL WITH GFR
Anion gap: 15 (ref 5–15)
BUN: 74 mg/dL — ABNORMAL HIGH (ref 8–23)
CO2: 27 mmol/L (ref 22–32)
Calcium: 9.4 mg/dL (ref 8.9–10.3)
Chloride: 85 mmol/L — ABNORMAL LOW (ref 98–111)
Creatinine, Ser: 2.1 mg/dL — ABNORMAL HIGH (ref 0.44–1.00)
GFR, Estimated: 24 mL/min — ABNORMAL LOW (ref >60)
Glucose, Bld: 154 mg/dL — ABNORMAL HIGH (ref 70–99)
Potassium: 3.3 mmol/L — ABNORMAL LOW (ref 3.5–5.1)
Sodium: 127 mmol/L — ABNORMAL LOW (ref 135–145)

## 2024-06-29 LAB — GLUCOSE, CAPILLARY
Glucose-Capillary: 161 mg/dL — ABNORMAL HIGH (ref 70–99)
Glucose-Capillary: 170 mg/dL — ABNORMAL HIGH (ref 70–99)
Glucose-Capillary: 160 mg/dL — ABNORMAL HIGH (ref 70–99)
Glucose-Capillary: 118 mg/dL — ABNORMAL HIGH (ref 70–99)

## 2024-06-29 MED ORDER — TORSEMIDE 20 MG PO TABS
20.0000 mg | ORAL_TABLET | Freq: Every day | ORAL | Status: DC
Start: 2024-06-29 — End: 2024-06-30
  Administered 2024-06-29 – 2024-06-30 (×2): 20 mg via ORAL
  Filled 2024-06-29 (×2): qty 1

## 2024-06-29 NOTE — Progress Notes (Signed)
 Physical Therapy Treatment Patient Details Name: Karen Farley MRN: [id number] DOB: July 14, 1946 Today's Date: 06/29/2024   History of Present Illness Pt is a 78 year old female who twisted and hit her ankle on car door with resulting acute oblique nondisplaced fracture of the distal fibula. PMH of diastolic CHF, hypertension, hyperlipidemia, diabetes mellitus, PAD, gout, GI bleeding, CKD-3B, A-fib on Eliquis , chronic hyponatremia, chronic venous insufficiency, lymphedema.    PT Comments  To EOB with set up, bed features and cga x 1.  Able to manage LE's on her own.  Sits EOB x 15 minutes for static sitting and for BLE AROM.  Attempts at lateral scoots remain challenging for pt with no functional movements noted.  She does stand x 2 with RW at edge of bed with min a x 2 to stand and mod cues and assist to maintain TTWB.  She is able to make some gains with lateral shuffle in standing to allow for movement towards HOB.  She does fatigue quickly and as she fatigues increased cues and assist to maintain WB status.  Ability remains decreased and she is not able to complete transfer to chair while maintaining WB status.  She is however making daily gains.  She needs only light min a to return to supine for LLE management.     If plan is discharge home, recommend the following: Two people to help with walking and/or transfers;Two people to help with bathing/dressing/bathroom;Assistance with cooking/housework;Assist for transportation;Help with stairs or ramp for entrance   Can travel by private vehicle        Equipment Recommendations       Recommendations for Other Services       Precautions / Restrictions Precautions Precautions: Fall Recall of Precautions/Restrictions: Impaired Precaution/Restrictions Comments: touch-down WB LLE Required Braces or Orthoses: Splint/Cast Splint/Cast: L ankle splint Restrictions Weight Bearing Restrictions Per Provider Order: Yes LLE Weight Bearing Per  Provider Order: Touchdown weight bearing     Mobility  Bed Mobility Overal bed mobility: Needs Assistance Bed Mobility: Supine to Sit, Sit to Supine     Supine to sit: Contact guard, Used rails, HOB elevated Sit to supine: Min assist   General bed mobility comments: less assist for LE's today. Patient Response: Cooperative  Transfers Overall transfer level: Needs assistance Equipment used: Rolling walker (2 wheels) Transfers: Sit to/from Stand Sit to Stand: Min assist, +2 physical assistance           General transfer comment: stood x 2 with some improvements in ability to side shuffle in standing but not functional for safe transfers with WB maintained.    Ambulation/Gait                   Stairs             Wheelchair Mobility     Tilt Bed Tilt Bed Patient Response: Cooperative  Modified Rankin (Stroke Patients Only)       Balance Overall balance assessment: Needs assistance Sitting-balance support: Feet supported, Bilateral upper extremity supported Sitting balance-Leahy Scale: Good     Standing balance support: Reliant on assistive device for balance, Bilateral upper extremity supported Standing balance-Leahy Scale: Poor Standing balance comment: +2 for safety and to maintain N/TTWB                            Communication Communication Communication: No apparent difficulties  Cognition Arousal: Alert Behavior During Therapy: [facility] for tasks assessed/performed  PT - Cognitive impairments: No apparent impairments                         Following commands: Intact      Cueing Cueing Techniques: Verbal cues, Tactile cues, Visual cues  Exercises Other Exercises Other Exercises: seated AROM, static and dynamic sitting EOB x 15 minutes    General Comments        Pertinent Vitals/Pain Pain Assessment Pain Assessment: Faces Faces Pain Scale: Hurts little more Pain Location: ankle Pain Descriptors /  Indicators: Sore, Aching Pain Intervention(s): Limited activity within patient's tolerance, Monitored during session, Repositioned    Home Living                          Prior Function            PT Goals (current goals can now be found in the care plan section) Progress towards PT goals: Progressing toward goals    Frequency    7X/week      PT Plan      Co-evaluation              AM-PAC PT 6 Clicks Mobility   Outcome Measure  Help needed turning from your back to your side while in a flat bed without using bedrails?: A Little Help needed moving from lying on your back to sitting on the side of a flat bed without using bedrails?: A Little Help needed moving to and from a bed to a chair (including a wheelchair)?: Total Help needed standing up from a chair using your arms (e.g., wheelchair or bedside chair)?: A Lot Help needed to walk in hospital room?: Total Help needed climbing 3-5 steps with a railing? : Total 6 Click Score: 11    End of Session Equipment Utilized During Treatment: Gait belt Activity Tolerance: Patient tolerated treatment well;Treatment limited secondary to medical complications (Comment) Patient left: in bed;with call bell/phone within reach;with bed alarm set   PT Visit Diagnosis: Unsteadiness on feet (R26.81);Other abnormalities of gait and mobility (R26.89);Muscle weakness (generalized) (M62.81);Difficulty in walking, not elsewhere classified (R26.2);Pain Pain - Right/Left: Left Pain - part of body: Ankle and joints of foot     Time: 1134-1202 PT Time Calculation (min) (ACUTE ONLY): 28 min  Charges:    $Therapeutic Exercise: 8-22 mins $Therapeutic Activity: 8-22 mins PT General Charges $$ ACUTE PT VISIT: 1 Visit                   Lauraine Gills, PTA 06/29/24, 12:10 PM

## 2024-06-29 NOTE — Progress Notes (Signed)
 [facility]  - [place] at [facility]   PATIENT NAME: Karen Farley    MR#:  [id number]  DATE OF BIRTH:  16-Feb-1946  SUBJECTIVE:  husband at bedside. Patient complains of left ankle pain and happened after she was trying to get out of the care car and ended up having left distal ankle fracture. She is denied any shortness of breath appears at baseline.   Had good BM yday. Feels ok VITALS:  Blood pressure (!) 90/50, pulse 88, temperature (!) 97.5 F (36.4 C), temperature source Oral, resp. rate 18, height 5' (1.524 m), weight 68.5 kg, SpO2 98%.  PHYSICAL EXAMINATION:   GENERAL:  78 y.o.-year-old patient with no acute distress.  LUNGS: Normal breath sounds bilaterally, no wheezing CARDIOVASCULAR: S1, S2 normal. No murmur   ABDOMEN: Soft, nontender, nondistended.  EXTREMITIES: left ankle splint    NEUROLOGIC: nonfocal  patient is alert and awake   LABORATORY PANEL:  CBC Recent Labs  Lab 06/26/24 0515  WBC 5.8  HGB 13.6  HCT 43.4  PLT 270    Chemistries  Recent Labs  Lab 06/25/24 0330 06/25/24 1621 06/29/24 0826  NA 121*   < > 127*  K 2.7*   < > 3.3*  CL 83*   < > 85*  CO2 25   < > 27  GLUCOSE 266*   < > 154*  BUN 86*   < > 74*  CREATININE 2.18*   < > 2.10*  CALCIUM  9.4   < > 9.4  MG 2.5*  --   --    < > = values in this interval not displayed.   Cardiac Enzymes No results for input(s): TROPONINI in the last 168 hours. RADIOLOGY:  US  UE VEIN MAPPING LEFT (PRE-OP  AVF) Result Date: 06/29/2024 CLINICAL DATA:  Preoperative vascular survey prior to arteriovenous fistula creation EXAM: US  EXTREM UP VEIN MAPPING COMPARISON:  None Available. FINDINGS: RIGHT ARTERIES Wrist Radial Artery: Size 2.41mm Waveform Triphasic Wrist Ulnar Artery: Size 1.57mm Waveform Triphasic Prox. Forearm Radial Artery: Size 2.40mm Waveform Triphasic Upper Arm Brachial Artery: Size 3.18mm Waveform Triphasic RIGHT VEINS Forearm Cephalic Vein: Diminutive Upper Arm Cephalic Vein: Prox  2.29mm Distal 2.54mm Depth 4.58mm Upper Arm Basilic Vein: Prox 5.39mm Distal 3.41mm Depth mm Upper Arm Brachial Vein: Diminutive ADDITIONAL RIGHT VEINS Subclavian Vein: Patient: Yes Respiratory Phasicity: Present Internal Jugular Vein: Patent: Yes    Respiratory Phasicity: Present Branches > 2 mm: None LEFT ARTERIES Wrist Radial Artery: Size 1.57mm Waveform Triphasic Wrist Ulnar Artery: Size 1.17mm Waveform Triphasic Prox. Forearm Radial Artery: Size 2.14mm Waveform Triphasic Upper Arm Brachial Artery: Size 3.66mm Waveform Triphasic LEFT VEINS Forearm Cephalic Vein: Diminutive Upper Arm Cephalic Vein: Diminutive Upper Arm Basilic Vein: Prox 4.41mm Distal 3.47mm Depth mm Upper Arm Brachial Vein: diminutive ADDITIONAL LEFT VEINS Subclavian Vein: Patient: Yes Respiratory Phasicity: Present Internal Jugular Vein: Patent: Yes    Respiratory Phasicity: Present Branches > 2 mm: None IMPRESSION: 1. No evidence of central venous occlusion or deep venous thrombosis of either upper extremity venous system. 2. Diminutive forearm veins bilaterally. 3. The bilateral basilic veins range in size from 3-5 mm and may be a suitable target. 4. Patent upper extremity arteries bilaterally with normal triphasic arterial waveforms. Electronically Signed   By: Wilkie Lent M.D.   On: 06/29/2024 12:01   US  UE VEIN MAPPING RIGHT (PRE-OP  AVF) Result Date: 06/29/2024 CLINICAL DATA:  Preoperative vascular survey prior to arteriovenous fistula creation EXAM: US  EXTREM UP VEIN MAPPING COMPARISON:  None Available.  FINDINGS: RIGHT ARTERIES Wrist Radial Artery: Size 2.44mm Waveform Triphasic Wrist Ulnar Artery: Size 1.42mm Waveform Triphasic Prox. Forearm Radial Artery: Size 2.71mm Waveform Triphasic Upper Arm Brachial Artery: Size 3.66mm Waveform Triphasic RIGHT VEINS Forearm Cephalic Vein: Diminutive Upper Arm Cephalic Vein: Prox 2.3mm Distal 2.81mm Depth 4.25mm Upper Arm Basilic Vein: Prox 5.6mm Distal 3.51mm Depth mm Upper Arm Brachial Vein: Diminutive  ADDITIONAL RIGHT VEINS Subclavian Vein: Patient: Yes Respiratory Phasicity: Present Internal Jugular Vein: Patent: Yes    Respiratory Phasicity: Present Branches > 2 mm: None LEFT ARTERIES Wrist Radial Artery: Size 1.48mm Waveform Triphasic Wrist Ulnar Artery: Size 1.36mm Waveform Triphasic Prox. Forearm Radial Artery: Size 2.13mm Waveform Triphasic Upper Arm Brachial Artery: Size 3.19mm Waveform Triphasic LEFT VEINS Forearm Cephalic Vein: Diminutive Upper Arm Cephalic Vein: Diminutive Upper Arm Basilic Vein: Prox 4.48mm Distal 3.22mm Depth mm Upper Arm Brachial Vein: diminutive ADDITIONAL LEFT VEINS Subclavian Vein: Patient: Yes Respiratory Phasicity: Present Internal Jugular Vein: Patent: Yes    Respiratory Phasicity: Present Branches > 2 mm: None IMPRESSION: 1. No evidence of central venous occlusion or deep venous thrombosis of either upper extremity venous system. 2. Diminutive forearm veins bilaterally. 3. The bilateral basilic veins range in size from 3-5 mm and may be a suitable target. 4. Patent upper extremity arteries bilaterally with normal triphasic arterial waveforms. Electronically Signed   By: Wilkie Lent M.D.   On: 06/29/2024 12:01     Assessment and Plan Karen Farley is a 78 y.o. female with medical history significant of IIDM, HTN, HLD, advanced chronic HFpEF, constrictive pericarditis, chronic A-fib, CKD stage IV, chronic hyponatremia, chronic lymphedema, presented with mechanical fall and left ankle fracture.   At baseline patient uses roller walker to ambulate since last year.  Yesterday, while exiting her car, she tripped left foot between curbside and her car she fell back to her car and twisted left ankle.   x-ray showed acute oblique nondisplaced fracture of the distal fibula   Left distal tibial nondisplaced fracture Secondary to mechanical fall - Nonoperable, got detailed instruction for PT from podiatry [facility] (dr Engineer, production on call--per [facility]), Would prefer she stay off of it  as much as possible. Should put most of her weight on the other foot and may put gentle contact on the foot. They could place her in a boot with a compression dressing but she would have to keep the boot on for the most part at all times as it would be acting as the splint/cast.  --prn pain meds _PT/OT--SNF --pt will f/u Dr Lennie as outpt   History of constrictive pericarditis H/o cardiomyopathy - Was evaluated 2 days ago by cardiology - cardiology recommended continue current diuresis of torsemide , metolazone  and spironolactone .     Hyponatremia, chronic - Severe volume overload, likely secondary to chronic CHF decompensation.  Baseline sodium level 125 and corrected sodium level today is 125. - will cont po dose of metolazone  and torsemide   Hypokalemia --replace with IV and PO   CKD stage IV - Creatinine level stable  Chronic Hypotension - Was on midodrine , there were different opinions regarding use of midodrine  between cardiology and nephrology.  Plan to hold midodrine  as well as patient does not have symptoms of hypotension.    PAF - In sinus rhythm - Not on systemic anticoagulation -ASA    Dm-2, hyperglycemia --A1c 7.6% in June 2025 --start Jardiance  for now --pt was on home Rybelsus , jardiance  and Toujeo   TOC for d/c planning to rehab   DVT prophylaxis: Heparin   subcu Code Status: Full code Family Communication: husband at bedside Level of care: Med-Surg Status is: Inpatient Remains inpatient appropriate because:pt is at baseline ad ok for d//c Pending bed and insurance auth    TOTAL TIME TAKING CARE OF THIS PATIENT: 35 minutes.  >50% time spent on counselling and coordination of care  Note: This dictation was prepared with Dragon dictation along with smaller phrase technology. Any transcriptional errors that result from this process are unintentional.  [name] M.D    [facility]   CC: Primary care physician; Vicci Duwaine SQUIBB, DO

## 2024-06-29 NOTE — Plan of Care (Signed)

## 2024-06-29 NOTE — Plan of Care (Signed)
 Pain stable but remains 7-8/10 but patient was able to rest and participate in bed mobility overnight. Educated and encouraged patient to turn to prevent skin breakdown.    Problem: Education: Goal: Knowledge of General Education information will improve Description: Including pain rating scale, medication(s)/side effects and non-pharmacologic comfort measures Outcome: Progressing   Problem: Health Behavior/Discharge Planning: Goal: Ability to manage health-related needs will improve Outcome: Progressing   Problem: Clinical Measurements: Goal: Ability to maintain clinical measurements within normal limits will improve Outcome: Progressing Goal: Will remain free from infection Outcome: Progressing Goal: Diagnostic test results will improve Outcome: Progressing Goal: Respiratory complications will improve Outcome: Progressing Goal: Cardiovascular complication will be avoided Outcome: Progressing   Problem: Activity: Goal: Risk for activity intolerance will decrease Outcome: Progressing   Problem: Nutrition: Goal: Adequate nutrition will be maintained Outcome: Progressing   Problem: Coping: Goal: Level of anxiety will decrease Outcome: Progressing   Problem: Elimination: Goal: Will not experience complications related to bowel motility Outcome: Progressing Goal: Will not experience complications related to urinary retention Outcome: Progressing   Problem: Pain Managment: Goal: General experience of comfort will improve and/or be controlled Outcome: Progressing   Problem: Safety: Goal: Ability to remain free from injury will improve Outcome: Progressing   Problem: Skin Integrity: Goal: Risk for impaired skin integrity will decrease Outcome: Progressing   Problem: Education: Goal: Ability to describe self-care measures that may prevent or decrease complications (Diabetes Survival Skills Education) will improve Outcome: Progressing Goal: Individualized Educational  Video(s) Outcome: Progressing   Problem: Coping: Goal: Ability to adjust to condition or change in health will improve Outcome: Progressing   Problem: Fluid Volume: Goal: Ability to maintain a balanced intake and output will improve Outcome: Progressing   Problem: Health Behavior/Discharge Planning: Goal: Ability to identify and utilize available resources and services will improve Outcome: Progressing Goal: Ability to manage health-related needs will improve Outcome: Progressing   Problem: Metabolic: Goal: Ability to maintain appropriate glucose levels will improve Outcome: Progressing   Problem: Nutritional: Goal: Maintenance of adequate nutrition will improve Outcome: Progressing Goal: Progress toward achieving an optimal weight will improve Outcome: Progressing   Problem: Skin Integrity: Goal: Risk for impaired skin integrity will decrease Outcome: Progressing   Problem: Tissue Perfusion: Goal: Adequacy of tissue perfusion will improve Outcome: Progressing

## 2024-06-30 ENCOUNTER — Encounter (INDEPENDENT_AMBULATORY_CARE_PROVIDER_SITE_OTHER)

## 2024-06-30 DIAGNOSIS — R262 Difficulty in walking, not elsewhere classified: Secondary | ICD-10-CM | POA: Diagnosis not present

## 2024-06-30 LAB — BASIC METABOLIC PANEL WITH GFR
Anion gap: 14 (ref 5–15)
BUN: 76 mg/dL — ABNORMAL HIGH (ref 8–23)
CO2: 27 mmol/L (ref 22–32)
Calcium: 9.4 mg/dL (ref 8.9–10.3)
Chloride: 86 mmol/L — ABNORMAL LOW (ref 98–111)
Creatinine, Ser: 2.17 mg/dL — ABNORMAL HIGH (ref 0.44–1.00)
GFR, Estimated: 23 mL/min — ABNORMAL LOW (ref >60)
Glucose, Bld: 124 mg/dL — ABNORMAL HIGH (ref 70–99)
Potassium: 3.4 mmol/L — ABNORMAL LOW (ref 3.5–5.1)
Sodium: 127 mmol/L — ABNORMAL LOW (ref 135–145)

## 2024-06-30 LAB — GLUCOSE, CAPILLARY
Glucose-Capillary: 145 mg/dL — ABNORMAL HIGH (ref 70–99)
Glucose-Capillary: 238 mg/dL — ABNORMAL HIGH (ref 70–99)
Glucose-Capillary: 127 mg/dL — ABNORMAL HIGH (ref 70–99)

## 2024-06-30 MED ORDER — ASPIRIN 81 MG PO TBEC: 81.0000 mg | DELAYED_RELEASE_TABLET | Freq: Every day | ORAL | Status: AC

## 2024-06-30 MED ORDER — EMPAGLIFLOZIN 25 MG PO TABS: 25.0000 mg | ORAL_TABLET | Freq: Every day | ORAL | Status: AC

## 2024-06-30 MED ORDER — OXYCODONE HCL 5 MG PO TABS: 5.0000 mg | ORAL_TABLET | Freq: Four times a day (QID) | ORAL | 0 refills | Status: DC | PRN

## 2024-06-30 MED ORDER — POTASSIUM CHLORIDE 20 MEQ PO PACK
40.0000 meq | PACK | Freq: Once | ORAL | Status: AC
  Administered 2024-06-30: 40 meq via ORAL
  Filled 2024-06-30: qty 2

## 2024-06-30 MED ORDER — POLYETHYLENE GLYCOL 3350 17 G PO PACK: 17.0000 g | PACK | Freq: Two times a day (BID) | ORAL | Status: AC

## 2024-06-30 MED ORDER — BISACODYL 5 MG PO TBEC: 5.0000 mg | DELAYED_RELEASE_TABLET | Freq: Every day | ORAL | Status: DC | PRN

## 2024-06-30 MED ORDER — ACETAMINOPHEN 325 MG PO TABS: 650.0000 mg | ORAL_TABLET | Freq: Four times a day (QID) | ORAL | Status: AC | PRN

## 2024-06-30 NOTE — Plan of Care (Signed)

## 2024-06-30 NOTE — Discharge Summary (Addendum)
 Physician Discharge Summary   Patient: Karen Farley MRN: [id number] DOB: March 26, 1946  Admit date:     06/25/2024  Discharge date: 06/30/24  Discharge Physician: Drue ONEIDA Potter   PCP: Karen Duwaine SQUIBB, DO   Recommendations at discharge:  Follow-up with podiatry  Discharge Diagnoses: Left ankle fracture  Hospital Course: Karen Farley is a 78 y.o. female with medical history significant of IIDM, HTN, HLD, advanced chronic HFpEF, constrictive pericarditis, chronic A-fib, CKD stage IV, chronic hyponatremia, chronic lymphedema, presented with mechanical fall and left ankle fracture.   At baseline patient uses roller walker to ambulate since last year.  On the day before presentation while exiting her car, she tripped left foot between curbside and her car she fell back to her car and twisted left ankle.    x-ray showed acute oblique nondisplaced fracture of the distal fibula    Other hospital course as noted below:  Assessment and Plan:  Left distal tibial nondisplaced fracture Secondary to mechanical fall - Nonoperable, got detailed instruction for PT from podiatry [facility] (dr Engineer, production ), Would prefer she stay off of it as much as possible. Should put most of her weight on the other foot and may put gentle contact on the foot. They could place her in a boot with a compression dressing but she would have to keep the boot on for the most part at all times as it would be acting as the splint/cast.  --prn pain meds _PT/OT--SNF --pt will f/u Dr Karen as outpt   History of constrictive pericarditis H/o cardiomyopathy - Was evaluated by cardiology - cardiology recommended continue current diuresis of torsemide , metolazone  and spironolactone .     Hyponatremia, chronic - Severe volume overload, likely secondary to chronic CHF decompensation.  Baseline sodium level 125  - will cont po dose of metolazone  and torsemide    Hypokalemia-improved   CKD stage IV - Creatinine level stable    Chronic Hypotension - Was on midodrine , there were different opinions regarding use of midodrine  between cardiology and nephrology.  Plan to hold midodrine  as well as patient does not have symptoms of hypotension.    PAF - In sinus rhythm - Not on systemic anticoagulation -ASA    Dm-2, hyperglycemia --A1c 7.6% in June 2025 -- Started on Jardiance     TOC for d/c planning to rehab   Consultants: Podiatry Procedures performed: None Disposition: Skilled nursing facility Diet recommendation:  Cardiac diet DISCHARGE MEDICATION: Allergies as of 06/30/2024   No Known Allergies      Medication List     TAKE these medications    acetaminophen  325 MG tablet Commonly known as: TYLENOL  Take 2 tablets (650 mg total) by mouth every 6 (six) hours as needed for mild pain (pain score 1-3) or fever (or Fever >/= 101).   allopurinol  100 MG tablet Commonly known as: ZYLOPRIM  Take 1 tablet (100 mg total) by mouth daily.   aspirin  EC 81 MG tablet Take 1 tablet (81 mg total) by mouth daily. Swallow whole. Start taking on: July 01, 2024   bisacodyl  5 MG EC tablet Commonly known as: DULCOLAX Take 1 tablet (5 mg total) by mouth daily as needed for moderate constipation.   empagliflozin  25 MG Tabs tablet Commonly known as: JARDIANCE  Take 1 tablet (25 mg total) by mouth daily. Start taking on: July 01, 2024   latanoprost  0.005 % ophthalmic solution Commonly known as: XALATAN  Place 1 drop into both eyes at bedtime.   metolazone  5 MG tablet Commonly known  as: ZAROXOLYN  Take 5 mg by mouth daily.   oxyCODONE  5 MG immediate release tablet Commonly known as: Oxy IR/ROXICODONE  Take 1 tablet (5 mg total) by mouth every 6 (six) hours as needed for severe pain (pain score 7-10) or breakthrough pain.   pantoprazole  40 MG tablet Commonly known as: PROTONIX  Take 1 tablet (40 mg total) by mouth 2 (two) times daily.   polyethylene glycol 17 g packet Commonly known as: MIRALAX  /  GLYCOLAX  Take 17 g by mouth 2 (two) times daily.   spironolactone  50 MG tablet Commonly known as: ALDACTONE  Take 1 tablet (50 mg total) by mouth daily.   torsemide  20 MG tablet Commonly known as: DEMADEX  2 tablets by mouth daily        Follow-up Information     Karen Farley, DPM Follow up on 07/09/2024.   Specialty: Podiatry Why: Go at 9:15am. Contact information: [address] [place] [address] [phone]         Karen Duwaine SQUIBB, DO. Go on 07/08/2024.   Specialty: Family Medicine Why: Hospital follow-up. Go at 1:00pm. Please bring ID, copay if you have one, list of meds and arrive 15 mins. early. Contact information: Karen Farley [place] [address] [phone]                Discharge Exam: Filed Weights   06/25/24 0330  Weight: 68.5 kg   GENERAL:  78 y.o.-year-old patient with no acute distress.  LUNGS: Normal breath sounds bilaterally, no wheezing CARDIOVASCULAR: S1, S2 normal. No murmur   ABDOMEN: Soft, nontender, nondistended.  EXTREMITIES: left ankle splint    NEUROLOGIC: nonfocal  patient is alert and awake    Condition at discharge: good  The results of significant diagnostics from this hospitalization (including imaging, microbiology, ancillary and laboratory) are listed below for reference.   Imaging Studies: US  UE VEIN MAPPING LEFT (PRE-OP  AVF) Result Date: 06/29/2024 CLINICAL DATA:  Preoperative vascular survey prior to arteriovenous fistula creation EXAM: US  EXTREM UP VEIN MAPPING COMPARISON:  None Available. FINDINGS: RIGHT ARTERIES Wrist Radial Artery: Size 2.73mm Waveform Triphasic Wrist Ulnar Artery: Size 1.24mm Waveform Triphasic Prox. Forearm Radial Artery: Size 2.75mm Waveform Triphasic Upper Arm Brachial Artery: Size 3.57mm Waveform Triphasic RIGHT VEINS Forearm Cephalic Vein: Diminutive Upper Arm Cephalic Vein: Prox 2.19mm Distal 2.52mm Depth 4.67mm Upper Arm Basilic Vein: Prox 5.25mm Distal 3.73mm Depth mm Upper Arm Brachial  Vein: Diminutive ADDITIONAL RIGHT VEINS Subclavian Vein: Patient: Yes Respiratory Phasicity: Present Internal Jugular Vein: Patent: Yes    Respiratory Phasicity: Present Branches > 2 mm: None LEFT ARTERIES Wrist Radial Artery: Size 1.54mm Waveform Triphasic Wrist Ulnar Artery: Size 1.17mm Waveform Triphasic Prox. Forearm Radial Artery: Size 2.18mm Waveform Triphasic Upper Arm Brachial Artery: Size 3.29mm Waveform Triphasic LEFT VEINS Forearm Cephalic Vein: Diminutive Upper Arm Cephalic Vein: Diminutive Upper Arm Basilic Vein: Prox 4.28mm Distal 3.67mm Depth mm Upper Arm Brachial Vein: diminutive ADDITIONAL LEFT VEINS Subclavian Vein: Patient: Yes Respiratory Phasicity: Present Internal Jugular Vein: Patent: Yes    Respiratory Phasicity: Present Branches > 2 mm: None IMPRESSION: 1. No evidence of central venous occlusion or deep venous thrombosis of either upper extremity venous system. 2. Diminutive forearm veins bilaterally. 3. The bilateral basilic veins range in size from 3-5 mm and may be a suitable target. 4. Patent upper extremity arteries bilaterally with normal triphasic arterial waveforms. Electronically Signed   By: Wilkie Lent M.D.   On: 06/29/2024 12:01   US  UE VEIN MAPPING RIGHT (PRE-OP  AVF) Result Date:  06/29/2024 CLINICAL DATA:  Preoperative vascular survey prior to arteriovenous fistula creation EXAM: US  EXTREM UP VEIN MAPPING COMPARISON:  None Available. FINDINGS: RIGHT ARTERIES Wrist Radial Artery: Size 2.15mm Waveform Triphasic Wrist Ulnar Artery: Size 1.85mm Waveform Triphasic Prox. Forearm Radial Artery: Size 2.53mm Waveform Triphasic Upper Arm Brachial Artery: Size 3.7mm Waveform Triphasic RIGHT VEINS Forearm Cephalic Vein: Diminutive Upper Arm Cephalic Vein: Prox 2.53mm Distal 2.61mm Depth 4.44mm Upper Arm Basilic Vein: Prox 5.35mm Distal 3.6mm Depth mm Upper Arm Brachial Vein: Diminutive ADDITIONAL RIGHT VEINS Subclavian Vein: Patient: Yes Respiratory Phasicity: Present Internal Jugular Vein:  Patent: Yes    Respiratory Phasicity: Present Branches > 2 mm: None LEFT ARTERIES Wrist Radial Artery: Size 1.48mm Waveform Triphasic Wrist Ulnar Artery: Size 1.72mm Waveform Triphasic Prox. Forearm Radial Artery: Size 2.70mm Waveform Triphasic Upper Arm Brachial Artery: Size 3.24mm Waveform Triphasic LEFT VEINS Forearm Cephalic Vein: Diminutive Upper Arm Cephalic Vein: Diminutive Upper Arm Basilic Vein: Prox 4.56mm Distal 3.70mm Depth mm Upper Arm Brachial Vein: diminutive ADDITIONAL LEFT VEINS Subclavian Vein: Patient: Yes Respiratory Phasicity: Present Internal Jugular Vein: Patent: Yes    Respiratory Phasicity: Present Branches > 2 mm: None IMPRESSION: 1. No evidence of central venous occlusion or deep venous thrombosis of either upper extremity venous system. 2. Diminutive forearm veins bilaterally. 3. The bilateral basilic veins range in size from 3-5 mm and may be a suitable target. 4. Patent upper extremity arteries bilaterally with normal triphasic arterial waveforms. Electronically Signed   By: Wilkie Lent M.D.   On: 06/29/2024 12:01   DG Ankle Complete Left Result Date: 06/25/2024 CLINICAL DATA:  Injury EXAM: LEFT ANKLE COMPLETE - 3+ VIEW FINDINGS: There is diffuse soft tissue swelling of the visualized lower extremity and ankle. There are 2 well corticated densities adjacent to the medial malleolus which are likely related to old injury. There is an acute oblique nondisplaced fracture of the distal fibula at and above the level of the ankle mortise. Joint spaces are well maintained. IMPRESSION: Acute oblique nondisplaced fracture of the distal fibula. Electronically Signed   By: Greig Pique M.D.   On: 06/25/2024 00:49    Microbiology: Results for orders placed or performed during the hospital encounter of 03/19/24  MRSA Next Gen by PCR, Nasal     Status: None   Collection Time: 03/22/24 12:53 PM   Specimen: Nasal Mucosa; Nasal Swab  Result Value Ref Range Status   MRSA by PCR Next Gen NOT  DETECTED NOT DETECTED Final    Comment: (NOTE) The GeneXpert MRSA Assay (FDA approved for NASAL specimens only), is one component of a comprehensive MRSA colonization surveillance program. It is not intended to diagnose MRSA infection nor to guide or monitor treatment for MRSA infections. Test performance is not FDA approved in patients less than 65 years old. Performed at [facility], [address] [place], [place] [address]     Labs: CBC: Recent Labs  Lab 06/25/24 0330 06/26/24 0515  WBC 6.5 5.8  NEUTROABS 5.6  --   HGB 13.3 13.6  HCT 42.9 43.4  MCV 74.5* 73.1*  PLT 260 270   Basic Metabolic Panel: Recent Labs  Lab 06/25/24 0330 06/25/24 1621 06/26/24 0515 06/28/24 0812 06/29/24 0826 06/30/24 0501  NA 121* 121* 126* 127* 127* 127*  K 2.7* 3.6 3.7 2.6* 3.3* 3.4*  CL 83* 84* 85* 83* 85* 86*  CO2 25 23 27 27 27 27   GLUCOSE 266* 179* 135* 158* 154* 124*  BUN 86* 80* 76* 79* 74* 76*  CREATININE 2.18* 1.96* 1.99* 2.05* 2.10* 2.17*  CALCIUM  9.4 9.5 9.8 9.7 9.4 9.4  MG 2.5*  --   --   --   --   --   PHOS 2.7  --   --   --   --   --    Liver Function Tests: No results for input(s): AST, ALT, ALKPHOS, BILITOT, PROT, ALBUMIN  in the last 168 hours. CBG: Recent Labs  Lab 06/29/24 1134 06/29/24 1810 06/29/24 2212 06/30/24 0800 06/30/24 1147  GLUCAP 170* 160* 118* 145* 238*    Discharge time spent:  .  Signed: Drue ONEIDA Potter, MD [facility] 06/30/2024

## 2024-06-30 NOTE — TOC Transition Note (Signed)
 Transition of Care [facility]) - Discharge Note   Patient Details  Name: Karen Farley MRN: [id number] Date of Birth: [birth year]  [facility]) CM/SW Contact:  Marinda Cooks, RN Phone Number: 06/30/2024, 2:49 PM   Clinical Narrative:    This CM updated by covering MD pt medically cleared to dc today and has active DC order . This CM spoke withTammy Admission liaison at [facility] and confirmed bed offer and insurance auth was approved pt going to [facility].DC transportation confirmed for pt with [facility] team updated . No additional DC needs requested by medical team or identified by CM at this time .     Final next level of care: Skilled Nursing Facility Barriers to Discharge: No Barriers Identified   Patient Goals and CMS Choice    Yes  Discharge Placement PASRR number recieved: 06/28/24 Existing PASRR number confirmed : 06/28/24          Patient chooses bed at: [facility] Patient to be transferred to facility by: Ambulance Name of family member notified: Karen Farley  (Spouse) [phone] Patient and family notified of of transfer: 06/30/24       Social Drivers of Health (SDOH) Interventions SDOH Screenings   Food Insecurity: No Food Insecurity (06/25/2024)  Housing: Low Risk  (06/25/2024)  Transportation Needs: No Transportation Needs (06/25/2024)  Utilities: Not At Risk (06/25/2024)  Alcohol Screen: Low Risk  (07/23/2022)  Depression (PHQ2-9): Low Risk  (12/19/2023)  Financial Resource Strain: Low Risk  (07/23/2022)  Physical Activity: Sufficiently Active (07/23/2022)  Social Connections: Moderately Isolated (06/25/2024)  Stress: No Stress Concern Present (07/23/2022)  Tobacco Use: Low Risk  (06/25/2024)     Readmission Risk Interventions     No data to display

## 2024-06-30 NOTE — Progress Notes (Signed)
 Mobility Specialist - Progress Note   06/30/24 1246  Mobility  Activity Dangled on edge of bed;Stood at bedside;Pivoted/transferred from bed to chair  Level of Assistance Minimal assist, patient does 75% or more (MinA +2)  Assistive Device Front wheel walker  Distance Ambulated (ft) 2 ft  LLE Weight Bearing Per Provider Order TWB  Activity Response Tolerated well  Mobility visit 1 Mobility  Mobility Specialist Start Time (ACUTE ONLY) 1226  Mobility Specialist Stop Time (ACUTE ONLY) 1239  Mobility Specialist Time Calculation (min) (ACUTE ONLY) 13 min   Pt transferred bed to chair via SPT MinA +2. Pt's L foot placed onto MS foot to follow WB restrictions. Pt left seated in the recliner with alarm set and needs within reach. RN notified.  America Silvan Mobility Specialist 06/30/24 4:34 PM

## 2024-06-30 NOTE — TOC Initial Note (Signed)
 Transition of Care [facility]) - Initial/Assessment Note    Patient Details  Name: Karen Farley MRN: [id number] Date of Birth: 12-06-1945  Transition of Care [facility]) CM/SW Contact:    Marinda Cooks, RN Phone Number: 06/30/2024, 11:54 AM  Clinical Narrative:                 This CM spoke with pt regarding bed offers pt selected [facility] . This CM confirmed with admission liaison Tammy and confirmed bed offer. Ins auth started b y Tammy at [facility]  . TOC will cont to follow dc planning/ care coordination and update as applicable.        Patient Goals and CMS Choice            Expected Discharge Plan and Services         Expected Discharge Date: 06/29/24                                    Prior Living Arrangements/Services                       Activities of Daily Living   ADL Screening (condition at time of admission) Independently performs ADLs?: No Does the patient have a NEW difficulty with bathing/dressing/toileting/self-feeding that is expected to last >3 days?: No Does the patient have a NEW difficulty with getting in/out of bed, walking, or climbing stairs that is expected to last >3 days?: Yes (Initiates electronic notice to provider for possible PT consult) Does the patient have a NEW difficulty with communication that is expected to last >3 days?: No Is the patient deaf or have difficulty hearing?: No Does the patient have difficulty seeing, even when wearing glasses/contacts?: No Does the patient have difficulty concentrating, remembering, or making decisions?: No  Permission Sought/Granted                  Emotional Assessment              Admission diagnosis:  Unable to ambulate [R26.2] Nondisplaced fracture of lateral malleolus of left fibula, initial encounter for closed fracture [S82.65XA] Ankle fracture [D17.100J] Patient Active Problem List   Diagnosis Date Noted   Nondisplaced fracture of lateral malleolus of left fibula,  initial encounter for closed fracture 06/26/2024   Chronic diastolic CHF (congestive heart failure) (HCC) 06/26/2024   Unable to ambulate 06/25/2024   Ankle fracture 06/25/2024   Hypotension 03/20/2024   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/20/2024   Acute on chronic diastolic heart failure (HCC) 03/19/2024   HTN (hypertension) 03/19/2024   Acute renal failure superimposed on stage 3b chronic kidney disease (HCC) 03/19/2024   Overweight (BMI 25.0-29.9) 03/19/2024   Myocardial injury 03/19/2024   Atrial fibrillation, chronic (HCC) 03/19/2024   Acute idiopathic gout involving toe 09/30/2023   Senile purpura (HCC) 12/13/2022   Pulmonary hypertension, primary (HCC) 09/07/2021   Pericardial effusion 08/05/2021   Acute blood loss anemia 08/04/2021   Anasarca    Stage 3b chronic kidney disease (HCC) 05/17/2021   Pleural effusion 05/17/2021   Acute on chronic diastolic CHF (congestive heart failure) (HCC) 05/17/2021   Chronic anticoagulation 05/17/2021   Acute kidney injury superimposed on CKD (HCC) 04/11/2021   Hyponatremia 04/11/2021   Acute on chronic heart failure with preserved ejection fraction (HFpEF) (HCC) 04/11/2021   Chronic venous insufficiency 08/14/2020   PAD (peripheral artery disease) (HCC) 08/14/2020   Bilateral  primary osteoarthritis of knee 04/13/2020   Acquired spondylolisthesis 12/17/2019   Lumbar radiculopathy 12/17/2019   Atrial fibrillation (HCC) 11/20/2019   Closed fracture of radial styloid 08/06/2019   Displaced fracture of unspecified radial styloid process, initial encounter for closed fracture 08/06/2019   Controlled substance agreement signed 01/24/2018   Piriformis syndrome 07/01/2016   Sacroiliac joint dysfunction 01/09/2016   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 [place] [address] Phone: [phone] Fax: [phone]  [facility] - [place], [place] - [address] [place] [address] Phone: [phone] Fax: [phone]  [id number] - [place], [place] - [address] [place] [address] Phone: [phone] Fax: [phone]     Social Drivers of Health (SDOH) Social History: SDOH Screenings   Food Insecurity: No Food Insecurity (06/25/2024)  Housing: Low Risk  (06/25/2024)  Transportation Needs: No Transportation Needs (06/25/2024)  Utilities: Not At Risk (06/25/2024)  Alcohol Screen: Low Risk  (07/23/2022)  Depression (PHQ2-9): Low Risk  (12/19/2023)  Financial Resource Strain: Low Risk  (07/23/2022)  Physical Activity: Sufficiently Active (07/23/2022)  Social Connections: Moderately Isolated (06/25/2024)  Stress: No Stress Concern Present (07/23/2022)  Tobacco Use: Low Risk  (06/25/2024)   SDOH Interventions:     Readmission Risk Interventions     No data to display

## 2024-07-05 ENCOUNTER — Encounter (INDEPENDENT_AMBULATORY_CARE_PROVIDER_SITE_OTHER): Admitting: Nurse Practitioner

## 2024-07-08 ENCOUNTER — Ambulatory Visit: Admitting: Family Medicine

## 2024-07-08 ENCOUNTER — Inpatient Hospital Stay: Admitting: Nurse Practitioner

## 2024-07-19 ENCOUNTER — Ambulatory Visit: Admitting: Family Medicine

## 2024-07-21 DIAGNOSIS — S8255XD Nondisplaced fracture of medial malleolus of left tibia, subsequent encounter for closed fracture with routine healing: Secondary | ICD-10-CM | POA: Diagnosis not present

## 2024-07-21 DIAGNOSIS — K567 Ileus, unspecified: Secondary | ICD-10-CM | POA: Diagnosis not present

## 2024-07-21 DIAGNOSIS — L24A2 Irritant contact dermatitis due to fecal, urinary or dual incontinence: Secondary | ICD-10-CM | POA: Diagnosis not present

## 2024-07-21 DIAGNOSIS — L89154 Pressure ulcer of sacral region, stage 4: Secondary | ICD-10-CM | POA: Diagnosis not present

## 2024-07-21 DIAGNOSIS — I5022 Chronic systolic (congestive) heart failure: Secondary | ICD-10-CM | POA: Diagnosis not present

## 2024-07-22 DIAGNOSIS — S8255XD Nondisplaced fracture of medial malleolus of left tibia, subsequent encounter for closed fracture with routine healing: Secondary | ICD-10-CM | POA: Diagnosis not present

## 2024-07-22 DIAGNOSIS — I5022 Chronic systolic (congestive) heart failure: Secondary | ICD-10-CM | POA: Diagnosis not present

## 2024-07-22 DIAGNOSIS — L24A2 Irritant contact dermatitis due to fecal, urinary or dual incontinence: Secondary | ICD-10-CM | POA: Diagnosis not present

## 2024-07-22 DIAGNOSIS — K567 Ileus, unspecified: Secondary | ICD-10-CM | POA: Diagnosis not present

## 2024-07-22 DIAGNOSIS — L89154 Pressure ulcer of sacral region, stage 4: Secondary | ICD-10-CM | POA: Diagnosis not present

## 2024-07-23 DIAGNOSIS — L03115 Cellulitis of right lower limb: Secondary | ICD-10-CM | POA: Diagnosis not present

## 2024-07-23 DIAGNOSIS — I480 Paroxysmal atrial fibrillation: Secondary | ICD-10-CM | POA: Diagnosis not present

## 2024-07-23 DIAGNOSIS — M6259 Muscle wasting and atrophy, not elsewhere classified, multiple sites: Secondary | ICD-10-CM | POA: Diagnosis not present

## 2024-07-23 DIAGNOSIS — M25572 Pain in left ankle and joints of left foot: Secondary | ICD-10-CM | POA: Diagnosis not present

## 2024-07-23 DIAGNOSIS — S8265XD Nondisplaced fracture of lateral malleolus of left fibula, subsequent encounter for closed fracture with routine healing: Secondary | ICD-10-CM | POA: Diagnosis not present

## 2024-07-23 DIAGNOSIS — E876 Hypokalemia: Secondary | ICD-10-CM | POA: Diagnosis not present

## 2024-07-23 DIAGNOSIS — S8255XD Nondisplaced fracture of medial malleolus of left tibia, subsequent encounter for closed fracture with routine healing: Secondary | ICD-10-CM | POA: Diagnosis not present

## 2024-07-23 DIAGNOSIS — R41841 Cognitive communication deficit: Secondary | ICD-10-CM | POA: Diagnosis not present

## 2024-07-23 DIAGNOSIS — E871 Hypo-osmolality and hyponatremia: Secondary | ICD-10-CM | POA: Diagnosis not present

## 2024-07-23 DIAGNOSIS — E119 Type 2 diabetes mellitus without complications: Secondary | ICD-10-CM | POA: Diagnosis not present

## 2024-07-23 DIAGNOSIS — Z741 Need for assistance with personal care: Secondary | ICD-10-CM | POA: Diagnosis not present

## 2024-07-26 DIAGNOSIS — R41841 Cognitive communication deficit: Secondary | ICD-10-CM | POA: Diagnosis not present

## 2024-07-26 DIAGNOSIS — M6259 Muscle wasting and atrophy, not elsewhere classified, multiple sites: Secondary | ICD-10-CM | POA: Diagnosis not present

## 2024-07-26 DIAGNOSIS — S8255XD Nondisplaced fracture of medial malleolus of left tibia, subsequent encounter for closed fracture with routine healing: Secondary | ICD-10-CM | POA: Diagnosis not present

## 2024-07-26 DIAGNOSIS — I480 Paroxysmal atrial fibrillation: Secondary | ICD-10-CM | POA: Diagnosis not present

## 2024-07-26 DIAGNOSIS — L03115 Cellulitis of right lower limb: Secondary | ICD-10-CM | POA: Diagnosis not present

## 2024-07-27 DIAGNOSIS — R41841 Cognitive communication deficit: Secondary | ICD-10-CM | POA: Diagnosis not present

## 2024-07-27 DIAGNOSIS — I480 Paroxysmal atrial fibrillation: Secondary | ICD-10-CM | POA: Diagnosis not present

## 2024-07-27 DIAGNOSIS — S8255XD Nondisplaced fracture of medial malleolus of left tibia, subsequent encounter for closed fracture with routine healing: Secondary | ICD-10-CM | POA: Diagnosis not present

## 2024-07-27 DIAGNOSIS — L03115 Cellulitis of right lower limb: Secondary | ICD-10-CM | POA: Diagnosis not present

## 2024-07-27 DIAGNOSIS — Z741 Need for assistance with personal care: Secondary | ICD-10-CM | POA: Diagnosis not present

## 2024-07-27 DIAGNOSIS — M6259 Muscle wasting and atrophy, not elsewhere classified, multiple sites: Secondary | ICD-10-CM | POA: Diagnosis not present

## 2024-07-28 DIAGNOSIS — I480 Paroxysmal atrial fibrillation: Secondary | ICD-10-CM | POA: Diagnosis not present

## 2024-07-28 DIAGNOSIS — S8255XD Nondisplaced fracture of medial malleolus of left tibia, subsequent encounter for closed fracture with routine healing: Secondary | ICD-10-CM | POA: Diagnosis not present

## 2024-07-28 DIAGNOSIS — M6259 Muscle wasting and atrophy, not elsewhere classified, multiple sites: Secondary | ICD-10-CM | POA: Diagnosis not present

## 2024-07-28 DIAGNOSIS — R41841 Cognitive communication deficit: Secondary | ICD-10-CM | POA: Diagnosis not present

## 2024-07-28 DIAGNOSIS — Z741 Need for assistance with personal care: Secondary | ICD-10-CM | POA: Diagnosis not present

## 2024-07-28 DIAGNOSIS — R635 Abnormal weight gain: Secondary | ICD-10-CM | POA: Diagnosis not present

## 2024-07-28 DIAGNOSIS — L03115 Cellulitis of right lower limb: Secondary | ICD-10-CM | POA: Diagnosis not present

## 2024-07-29 DIAGNOSIS — E119 Type 2 diabetes mellitus without complications: Secondary | ICD-10-CM | POA: Diagnosis not present

## 2024-07-29 DIAGNOSIS — M6259 Muscle wasting and atrophy, not elsewhere classified, multiple sites: Secondary | ICD-10-CM | POA: Diagnosis not present

## 2024-07-29 DIAGNOSIS — L03115 Cellulitis of right lower limb: Secondary | ICD-10-CM | POA: Diagnosis not present

## 2024-07-29 DIAGNOSIS — I480 Paroxysmal atrial fibrillation: Secondary | ICD-10-CM | POA: Diagnosis not present

## 2024-07-29 DIAGNOSIS — R41841 Cognitive communication deficit: Secondary | ICD-10-CM | POA: Diagnosis not present

## 2024-07-29 DIAGNOSIS — I5022 Chronic systolic (congestive) heart failure: Secondary | ICD-10-CM | POA: Diagnosis not present

## 2024-07-29 DIAGNOSIS — Z741 Need for assistance with personal care: Secondary | ICD-10-CM | POA: Diagnosis not present

## 2024-07-29 DIAGNOSIS — L89154 Pressure ulcer of sacral region, stage 4: Secondary | ICD-10-CM | POA: Diagnosis not present

## 2024-07-29 DIAGNOSIS — S8255XD Nondisplaced fracture of medial malleolus of left tibia, subsequent encounter for closed fracture with routine healing: Secondary | ICD-10-CM | POA: Diagnosis not present

## 2024-07-30 DIAGNOSIS — Z741 Need for assistance with personal care: Secondary | ICD-10-CM | POA: Diagnosis not present

## 2024-07-30 DIAGNOSIS — R41841 Cognitive communication deficit: Secondary | ICD-10-CM | POA: Diagnosis not present

## 2024-07-30 DIAGNOSIS — M6259 Muscle wasting and atrophy, not elsewhere classified, multiple sites: Secondary | ICD-10-CM | POA: Diagnosis not present

## 2024-07-30 DIAGNOSIS — L03115 Cellulitis of right lower limb: Secondary | ICD-10-CM | POA: Diagnosis not present

## 2024-07-30 DIAGNOSIS — I480 Paroxysmal atrial fibrillation: Secondary | ICD-10-CM | POA: Diagnosis not present

## 2024-08-03 DIAGNOSIS — E875 Hyperkalemia: Secondary | ICD-10-CM | POA: Diagnosis not present

## 2024-08-03 DIAGNOSIS — M79671 Pain in right foot: Secondary | ICD-10-CM | POA: Diagnosis not present

## 2024-08-03 DIAGNOSIS — R600 Localized edema: Secondary | ICD-10-CM | POA: Diagnosis not present

## 2024-08-03 DIAGNOSIS — L0889 Other specified local infections of the skin and subcutaneous tissue: Secondary | ICD-10-CM | POA: Diagnosis not present

## 2024-08-03 DIAGNOSIS — K5900 Constipation, unspecified: Secondary | ICD-10-CM | POA: Diagnosis not present

## 2024-08-04 DIAGNOSIS — E119 Type 2 diabetes mellitus without complications: Secondary | ICD-10-CM | POA: Diagnosis not present

## 2024-08-04 DIAGNOSIS — E8809 Other disorders of plasma-protein metabolism, not elsewhere classified: Secondary | ICD-10-CM | POA: Diagnosis not present

## 2024-08-05 DIAGNOSIS — E876 Hypokalemia: Secondary | ICD-10-CM | POA: Diagnosis not present

## 2024-08-05 DIAGNOSIS — L0889 Other specified local infections of the skin and subcutaneous tissue: Secondary | ICD-10-CM | POA: Diagnosis not present

## 2024-08-05 DIAGNOSIS — E119 Type 2 diabetes mellitus without complications: Secondary | ICD-10-CM | POA: Diagnosis not present

## 2024-08-05 DIAGNOSIS — R197 Diarrhea, unspecified: Secondary | ICD-10-CM | POA: Diagnosis not present

## 2024-08-05 DIAGNOSIS — L89154 Pressure ulcer of sacral region, stage 4: Secondary | ICD-10-CM | POA: Diagnosis not present

## 2024-08-06 DIAGNOSIS — S8265XD Nondisplaced fracture of lateral malleolus of left fibula, subsequent encounter for closed fracture with routine healing: Secondary | ICD-10-CM | POA: Diagnosis not present

## 2024-08-06 DIAGNOSIS — M25572 Pain in left ankle and joints of left foot: Secondary | ICD-10-CM | POA: Diagnosis not present

## 2024-08-06 DIAGNOSIS — I5020 Unspecified systolic (congestive) heart failure: Secondary | ICD-10-CM | POA: Diagnosis not present

## 2024-08-06 NOTE — Progress Notes (Signed)
 Foot and Ankle Surgery Patient Visit  Chief Complaint:   No chief complaint on file.   Subjective   HPI  Karen Farley is a 78 y.o. female who presents for No chief complaint on file.  She is about 6w out from sustaining a left ankle fx.   History of Present Illness Karen Farley is a 78 year old female who presents for follow-up of a left ankle fracture sustained 6 weeks ago.  It has been six weeks since the fibula fracture. She experiences some soreness but manages the pain with medication, taking a pain pill as needed. She uses a boot for support and is currently at a rehabilitation facility [facility].  Swelling is present in both legs, with the right leg becoming more swollen during activity. She is working on increasing her mobility and can move her ankle up and down. She uses a walker to aid in mobility and is expected to continue rehabilitation at the facility for at least another two weeks. She is working on being able to walk safely before returning home.  She reports that the pain has improved since the fracture.  There is no problem list on file for this patient.   No Known Allergies  Social Drivers of Health   Tobacco Use: Low Risk  (06/25/2024)   Received from [facility]   Patient History   . Smoking Tobacco Use: Never   . Smokeless Tobacco Use: Never   . Passive Exposure: Not on file  Alcohol Use: Not on file  Financial Resource Strain: Low Risk  (07/09/2024)   Overall Financial Resource Strain (CARDIA)   . Difficulty of Paying Living Expenses: Not hard at all  Food Insecurity: No Food Insecurity (07/09/2024)   Hunger Vital Sign   . Worried About Programme Researcher, Broadcasting/film/video in the Last Year: Never true   . Ran Out of Food in the Last Year: Never true  Transportation Needs: No Transportation Needs (07/09/2024)   PRAPARE - Transportation   . Lack of Transportation (Medical): No   . Lack of Transportation (Non-Medical): No  Physical Activity: Sufficiently Active  (07/23/2022)   Received from [facility]   Exercise Vital Sign   . On average, how many days per week do you engage in moderate to strenuous exercise (like a brisk walk)?: 3 days   . On average, how many minutes do you engage in exercise at this level?: 60 min  Stress: No Stress Concern Present (07/23/2022)   Received from [facility] - Occupational Stress Questionnaire   . Feeling of Stress : Not at all  Social Connections: Moderately Isolated (06/25/2024)   Received from [facility]   Social Connection and Isolation Panel   . In a typical week, how many times do you talk on the phone with family, friends, or neighbors?: Twice a week   . How often do you get together with friends or relatives?: Once a week   . How often do you attend church or religious services?: Never   . Do you belong to any clubs or organizations such as church groups, unions, fraternal or athletic groups, or school groups?: No   . How often do you attend meetings of the clubs or organizations you belong to?: Never   . Are you married, widowed, divorced, separated, never married, or living with a partner?: Married  Depression: Not on file  Housing Stability: Low Risk  (07/23/2024)   Housing Stability Vital Sign   .  Unable to Pay for Housing in the Last Year: No   . Number of Times Moved in the Last Year: 0   . Homeless in the Last Year: No  Utilities: Not At Risk (07/09/2024)   [facility] Utilities   . Threatened with loss of utilities: No  Health Literacy: Not on file    Outpatient Medications Prior to Visit  Medication Sig Dispense Refill  . acetaminophen  (TYLENOL ) 325 MG tablet Take 650 mg by mouth    . allopurinoL  (ZYLOPRIM ) 100 MG tablet Take 100 mg by mouth once daily    . aspirin  81 MG EC tablet Take 81 mg by mouth once daily    . bisacodyL  (DULCOLAX) 5 mg EC tablet Take 5 mg by mouth    . empagliflozin  (JARDIANCE ) 25 mg tablet Take 25 mg by mouth once daily    .  FUROsemide  (LASIX ) 20 MG tablet Take 20 mg by mouth once daily    . latanoprost  (XALATAN ) 0.005 % ophthalmic solution Place 1 drop into both eyes at bedtime    . metOLazone  (ZAROXOLYN ) 2.5 MG tablet  (Patient not taking: Reported on 07/23/2024)    . naloxone (NARCAN) 4 mg/actuation nasal spray Place 4 mg into one nostril once    . oxyCODONE  (ROXICODONE ) 5 MG immediate release tablet Take 5 mg by mouth every 6 (six) hours as needed for Pain    . pantoprazole  (PROTONIX ) 40 MG DR tablet Take 40 mg by mouth every 12 (twelve) hours    . polyethylene glycol (MIRALAX ) packet Take 17 g by mouth 2 (two) times daily    . spironolactone  (ALDACTONE ) 50 MG tablet Take 50 mg by mouth once daily    . TORsemide  (DEMADEX ) 20 MG tablet      No facility-administered medications prior to visit.      Objective   There were no vitals filed for this visit.   There is no height or weight on file to calculate BMI.  Home Vitals:     Physical Exam Physical Exam EXTREMITIES: Swelling reduced with visible skin wrinkles on left ankle.   EXTREMITIES: Swelling present on both legs.  General/Constitutional: No apparent distress: well-nourished and well developed.   Psych: Normal mood and affect, oriented to person, place and time (AOx3)   Vascular: DP/PT pulses faintly palpable bilateral, capillary fill time intact to digits bilaterally but slightly increased, no hair growth present to digits of bilateral lower extremities.  Mild swelling present to left ankle today.   Neuro: Light touch sensation intact bilateral lower extremities.   Derm: No open lesions or ulcerations present to bilateral lower extremities.  Skin appears to be thin and atrophic to both feet.   MSK: No obvious deformity noted to left ankle.  Minimal to no pain on palpation of left distal fibula, no pain on palpation to the left ankle anteriorly or medially or posteriorly.  Patient able to perform gentle dorsiflexion and plantarflexion  exercises of left ankle without much pain or discomfort today.  Results RADIOLOGY Left ankle x-ray AP, lateral, mortise: Subacute fracture of distal fibula, slightly displaced 1 mm laterally, near anatomic position.  Ankle mortise appears to be well aligned.  Osteophytes on medial malleolus.  Some increase interval bone callus formation present around the distal fibula and around the fracture area.  Appears stable.  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 (07/23/2024)     Assessment/Plan:    Assessment & Plan Closed nondisplaced fracture of lateral malleolus of  left fibula with routine healing Fracture healing routinely with bone formation and bridging, no significant displacement, ankle well-positioned. - X-ray imaging reviewed and discussed with patient in detail. - Wear boot for two weeks while walking, recommend use of walker. - Transition to ankle brace after two weeks if comfortable.  Dispensed ASO today.  Wear this inside of a good and supportive shoe with use of walker likely. - Recommend rest, ice, compression, elevation with use of Tylenol /NSAIDs relief as needed. - Could consider [facility] in 2 to 3 weeks if doing well.  Orders given - Re-evaluate in one month.   Diagnoses and all orders for this visit:  Closed nondisplaced fracture of lateral malleolus of left fibula with routine healing, subsequent encounter -     X-ray ankle left 3 plus views; Future  Acute left ankle pain -     X-ray ankle left 3 plus views; Future    Return in about 4 weeks (around 09/03/2024) for 10-week left ankle fracture follow-up.  An after visit summary was provided for the patient either in written format (printed) or through MyChart if needed/necessary .  This note has been created using automated tools and reviewed for accuracy by ANDREW MICHAEL BAKER.  Translation done by AI may create words close to the sound of words used at visit,  but errors could be present.  Note was reviewed.

## 2024-08-09 DIAGNOSIS — E876 Hypokalemia: Secondary | ICD-10-CM | POA: Diagnosis not present

## 2024-08-09 DIAGNOSIS — R41841 Cognitive communication deficit: Secondary | ICD-10-CM | POA: Diagnosis not present

## 2024-08-09 DIAGNOSIS — M6259 Muscle wasting and atrophy, not elsewhere classified, multiple sites: Secondary | ICD-10-CM | POA: Diagnosis not present

## 2024-08-09 DIAGNOSIS — I480 Paroxysmal atrial fibrillation: Secondary | ICD-10-CM | POA: Diagnosis not present

## 2024-08-09 DIAGNOSIS — Z741 Need for assistance with personal care: Secondary | ICD-10-CM | POA: Diagnosis not present

## 2024-08-09 DIAGNOSIS — L03115 Cellulitis of right lower limb: Secondary | ICD-10-CM | POA: Diagnosis not present

## 2024-08-09 DIAGNOSIS — S8255XD Nondisplaced fracture of medial malleolus of left tibia, subsequent encounter for closed fracture with routine healing: Secondary | ICD-10-CM | POA: Diagnosis not present

## 2024-08-09 DIAGNOSIS — I5022 Chronic systolic (congestive) heart failure: Secondary | ICD-10-CM | POA: Diagnosis not present

## 2024-08-09 DIAGNOSIS — E871 Hypo-osmolality and hyponatremia: Secondary | ICD-10-CM | POA: Diagnosis not present

## 2024-08-11 DIAGNOSIS — R41841 Cognitive communication deficit: Secondary | ICD-10-CM | POA: Diagnosis not present

## 2024-08-11 DIAGNOSIS — M6259 Muscle wasting and atrophy, not elsewhere classified, multiple sites: Secondary | ICD-10-CM | POA: Diagnosis not present

## 2024-08-11 DIAGNOSIS — S8255XD Nondisplaced fracture of medial malleolus of left tibia, subsequent encounter for closed fracture with routine healing: Secondary | ICD-10-CM | POA: Diagnosis not present

## 2024-08-11 DIAGNOSIS — Z741 Need for assistance with personal care: Secondary | ICD-10-CM | POA: Diagnosis not present

## 2024-08-11 DIAGNOSIS — I5020 Unspecified systolic (congestive) heart failure: Secondary | ICD-10-CM | POA: Diagnosis not present

## 2024-08-11 DIAGNOSIS — L03115 Cellulitis of right lower limb: Secondary | ICD-10-CM | POA: Diagnosis not present

## 2024-08-12 DIAGNOSIS — L89154 Pressure ulcer of sacral region, stage 4: Secondary | ICD-10-CM | POA: Diagnosis not present

## 2024-08-12 DIAGNOSIS — R945 Abnormal results of liver function studies: Secondary | ICD-10-CM | POA: Diagnosis not present

## 2024-08-12 DIAGNOSIS — I5022 Chronic systolic (congestive) heart failure: Secondary | ICD-10-CM | POA: Diagnosis not present

## 2024-08-12 DIAGNOSIS — E119 Type 2 diabetes mellitus without complications: Secondary | ICD-10-CM | POA: Diagnosis not present

## 2024-08-13 DIAGNOSIS — R41841 Cognitive communication deficit: Secondary | ICD-10-CM | POA: Diagnosis not present

## 2024-08-13 DIAGNOSIS — M6259 Muscle wasting and atrophy, not elsewhere classified, multiple sites: Secondary | ICD-10-CM | POA: Diagnosis not present

## 2024-08-13 DIAGNOSIS — I480 Paroxysmal atrial fibrillation: Secondary | ICD-10-CM | POA: Diagnosis not present

## 2024-08-13 DIAGNOSIS — S8255XD Nondisplaced fracture of medial malleolus of left tibia, subsequent encounter for closed fracture with routine healing: Secondary | ICD-10-CM | POA: Diagnosis not present

## 2024-08-13 DIAGNOSIS — L03115 Cellulitis of right lower limb: Secondary | ICD-10-CM | POA: Diagnosis not present

## 2024-08-13 DIAGNOSIS — Z741 Need for assistance with personal care: Secondary | ICD-10-CM | POA: Diagnosis not present

## 2024-08-16 DIAGNOSIS — Z741 Need for assistance with personal care: Secondary | ICD-10-CM | POA: Diagnosis not present

## 2024-08-18 DIAGNOSIS — L03115 Cellulitis of right lower limb: Secondary | ICD-10-CM | POA: Diagnosis not present

## 2024-08-18 DIAGNOSIS — I5022 Chronic systolic (congestive) heart failure: Secondary | ICD-10-CM | POA: Diagnosis not present

## 2024-08-18 DIAGNOSIS — N184 Chronic kidney disease, stage 4 (severe): Secondary | ICD-10-CM | POA: Diagnosis not present

## 2024-08-18 DIAGNOSIS — R945 Abnormal results of liver function studies: Secondary | ICD-10-CM | POA: Diagnosis not present

## 2024-08-18 DIAGNOSIS — I480 Paroxysmal atrial fibrillation: Secondary | ICD-10-CM | POA: Diagnosis not present

## 2024-08-18 DIAGNOSIS — R41841 Cognitive communication deficit: Secondary | ICD-10-CM | POA: Diagnosis not present

## 2024-08-18 DIAGNOSIS — M6259 Muscle wasting and atrophy, not elsewhere classified, multiple sites: Secondary | ICD-10-CM | POA: Diagnosis not present

## 2024-08-18 DIAGNOSIS — S8255XD Nondisplaced fracture of medial malleolus of left tibia, subsequent encounter for closed fracture with routine healing: Secondary | ICD-10-CM | POA: Diagnosis not present

## 2024-08-18 DIAGNOSIS — Z741 Need for assistance with personal care: Secondary | ICD-10-CM | POA: Diagnosis not present

## 2024-08-19 ENCOUNTER — Emergency Department

## 2024-08-19 ENCOUNTER — Telehealth: Payer: Self-pay | Admitting: Cardiovascular Disease

## 2024-08-19 ENCOUNTER — Inpatient Hospital Stay
Admission: EM | Admit: 2024-08-19 | Discharge: 2024-08-23 | DRG: 640 | Disposition: A | Discharge status: Skilled Nursing Facility | Source: Skilled Nursing Facility | Attending: Internal Medicine | Admitting: Internal Medicine

## 2024-08-19 ENCOUNTER — Other Ambulatory Visit: Payer: Self-pay

## 2024-08-19 ENCOUNTER — Observation Stay

## 2024-08-19 DIAGNOSIS — Z7982 Long term (current) use of aspirin: Secondary | ICD-10-CM

## 2024-08-19 DIAGNOSIS — I130 Hypertensive heart and chronic kidney disease with heart failure and stage 1 through stage 4 chronic kidney disease, or unspecified chronic kidney disease: Secondary | ICD-10-CM | POA: Diagnosis present

## 2024-08-19 DIAGNOSIS — I4821 Permanent atrial fibrillation: Secondary | ICD-10-CM | POA: Diagnosis present

## 2024-08-19 DIAGNOSIS — K7460 Unspecified cirrhosis of liver: Secondary | ICD-10-CM | POA: Diagnosis present

## 2024-08-19 DIAGNOSIS — Z961 Presence of intraocular lens: Secondary | ICD-10-CM | POA: Diagnosis present

## 2024-08-19 DIAGNOSIS — J90 Pleural effusion, not elsewhere classified: Secondary | ICD-10-CM | POA: Diagnosis not present

## 2024-08-19 DIAGNOSIS — Z8249 Family history of ischemic heart disease and other diseases of the circulatory system: Secondary | ICD-10-CM | POA: Diagnosis not present

## 2024-08-19 DIAGNOSIS — E1122 Type 2 diabetes mellitus with diabetic chronic kidney disease: Secondary | ICD-10-CM | POA: Diagnosis present

## 2024-08-19 DIAGNOSIS — Z820 Family history of epilepsy and other diseases of the nervous system: Secondary | ICD-10-CM

## 2024-08-19 DIAGNOSIS — I5033 Acute on chronic diastolic (congestive) heart failure: Secondary | ICD-10-CM | POA: Diagnosis present

## 2024-08-19 DIAGNOSIS — N184 Chronic kidney disease, stage 4 (severe): Secondary | ICD-10-CM | POA: Diagnosis present

## 2024-08-19 DIAGNOSIS — I5032 Chronic diastolic (congestive) heart failure: Secondary | ICD-10-CM | POA: Diagnosis not present

## 2024-08-19 DIAGNOSIS — Z85828 Personal history of other malignant neoplasm of skin: Secondary | ICD-10-CM

## 2024-08-19 DIAGNOSIS — Z9049 Acquired absence of other specified parts of digestive tract: Secondary | ICD-10-CM

## 2024-08-19 DIAGNOSIS — I5022 Chronic systolic (congestive) heart failure: Secondary | ICD-10-CM | POA: Diagnosis not present

## 2024-08-19 DIAGNOSIS — E8770 Fluid overload, unspecified: Secondary | ICD-10-CM | POA: Diagnosis not present

## 2024-08-19 DIAGNOSIS — Z803 Family history of malignant neoplasm of breast: Secondary | ICD-10-CM

## 2024-08-19 DIAGNOSIS — L89153 Pressure ulcer of sacral region, stage 3: Secondary | ICD-10-CM | POA: Diagnosis present

## 2024-08-19 DIAGNOSIS — Z8262 Family history of osteoporosis: Secondary | ICD-10-CM

## 2024-08-19 DIAGNOSIS — N289 Disorder of kidney and ureter, unspecified: Secondary | ICD-10-CM | POA: Diagnosis not present

## 2024-08-19 DIAGNOSIS — R188 Other ascites: Secondary | ICD-10-CM | POA: Diagnosis present

## 2024-08-19 DIAGNOSIS — I425 Other restrictive cardiomyopathy: Secondary | ICD-10-CM | POA: Diagnosis not present

## 2024-08-19 DIAGNOSIS — E785 Hyperlipidemia, unspecified: Secondary | ICD-10-CM | POA: Diagnosis present

## 2024-08-19 DIAGNOSIS — T502X5A Adverse effect of carbonic-anhydrase inhibitors, benzothiadiazides and other diuretics, initial encounter: Secondary | ICD-10-CM | POA: Diagnosis present

## 2024-08-19 DIAGNOSIS — Z7401 Bed confinement status: Secondary | ICD-10-CM | POA: Diagnosis not present

## 2024-08-19 DIAGNOSIS — I272 Pulmonary hypertension, unspecified: Secondary | ICD-10-CM | POA: Diagnosis present

## 2024-08-19 DIAGNOSIS — R600 Localized edema: Secondary | ICD-10-CM | POA: Diagnosis not present

## 2024-08-19 DIAGNOSIS — I9589 Other hypotension: Secondary | ICD-10-CM | POA: Diagnosis present

## 2024-08-19 DIAGNOSIS — R609 Edema, unspecified: Secondary | ICD-10-CM | POA: Diagnosis not present

## 2024-08-19 DIAGNOSIS — Z7984 Long term (current) use of oral hypoglycemic drugs: Secondary | ICD-10-CM

## 2024-08-19 DIAGNOSIS — D509 Iron deficiency anemia, unspecified: Secondary | ICD-10-CM | POA: Diagnosis present

## 2024-08-19 DIAGNOSIS — I890 Lymphedema, not elsewhere classified: Secondary | ICD-10-CM | POA: Diagnosis present

## 2024-08-19 DIAGNOSIS — I252 Old myocardial infarction: Secondary | ICD-10-CM

## 2024-08-19 DIAGNOSIS — I311 Chronic constrictive pericarditis: Secondary | ICD-10-CM | POA: Diagnosis present

## 2024-08-19 DIAGNOSIS — W010XXD Fall on same level from slipping, tripping and stumbling without subsequent striking against object, subsequent encounter: Secondary | ICD-10-CM | POA: Diagnosis present

## 2024-08-19 DIAGNOSIS — I080 Rheumatic disorders of both mitral and aortic valves: Secondary | ICD-10-CM | POA: Diagnosis present

## 2024-08-19 DIAGNOSIS — Z9841 Cataract extraction status, right eye: Secondary | ICD-10-CM

## 2024-08-19 DIAGNOSIS — I5082 Biventricular heart failure: Secondary | ICD-10-CM | POA: Diagnosis present

## 2024-08-19 DIAGNOSIS — Z79899 Other long term (current) drug therapy: Secondary | ICD-10-CM

## 2024-08-19 DIAGNOSIS — Z800 Family history of malignant neoplasm of digestive organs: Secondary | ICD-10-CM

## 2024-08-19 DIAGNOSIS — Z743 Need for continuous supervision: Secondary | ICD-10-CM | POA: Diagnosis not present

## 2024-08-19 DIAGNOSIS — R945 Abnormal results of liver function studies: Secondary | ICD-10-CM | POA: Diagnosis not present

## 2024-08-19 DIAGNOSIS — M170 Bilateral primary osteoarthritis of knee: Secondary | ICD-10-CM | POA: Diagnosis present

## 2024-08-19 DIAGNOSIS — I3139 Other pericardial effusion (noninflammatory): Secondary | ICD-10-CM | POA: Diagnosis present

## 2024-08-19 DIAGNOSIS — S8265XD Nondisplaced fracture of lateral malleolus of left fibula, subsequent encounter for closed fracture with routine healing: Secondary | ICD-10-CM

## 2024-08-19 DIAGNOSIS — I700 Atherosclerosis of aorta: Secondary | ICD-10-CM | POA: Diagnosis not present

## 2024-08-19 DIAGNOSIS — Z833 Family history of diabetes mellitus: Secondary | ICD-10-CM

## 2024-08-19 DIAGNOSIS — E876 Hypokalemia: Secondary | ICD-10-CM | POA: Diagnosis present

## 2024-08-19 DIAGNOSIS — I5020 Unspecified systolic (congestive) heart failure: Secondary | ICD-10-CM | POA: Diagnosis not present

## 2024-08-19 DIAGNOSIS — I959 Hypotension, unspecified: Secondary | ICD-10-CM | POA: Diagnosis not present

## 2024-08-19 DIAGNOSIS — Z9842 Cataract extraction status, left eye: Secondary | ICD-10-CM

## 2024-08-19 DIAGNOSIS — Z794 Long term (current) use of insulin: Secondary | ICD-10-CM

## 2024-08-19 DIAGNOSIS — R54 Age-related physical debility: Secondary | ICD-10-CM | POA: Diagnosis present

## 2024-08-19 DIAGNOSIS — R918 Other nonspecific abnormal finding of lung field: Secondary | ICD-10-CM | POA: Diagnosis not present

## 2024-08-19 DIAGNOSIS — L89154 Pressure ulcer of sacral region, stage 4: Secondary | ICD-10-CM | POA: Diagnosis not present

## 2024-08-19 DIAGNOSIS — I517 Cardiomegaly: Secondary | ICD-10-CM | POA: Diagnosis not present

## 2024-08-19 LAB — PHOSPHORUS: Phosphorus: 2.7 mg/dL (ref 2.5–4.6)

## 2024-08-19 LAB — COMPREHENSIVE METABOLIC PANEL WITH GFR
ALT: 10 U/L (ref 0–44)
AST: 29 U/L (ref 15–41)
Albumin: 3.3 g/dL — ABNORMAL LOW (ref 3.5–5.0)
Alkaline Phosphatase: 137 U/L — ABNORMAL HIGH (ref 38–126)
Anion gap: 18 — ABNORMAL HIGH (ref 5–15)
BUN: 72 mg/dL — ABNORMAL HIGH (ref 8–23)
CO2: 28 mmol/L (ref 22–32)
Calcium: 9.4 mg/dL (ref 8.9–10.3)
Chloride: 88 mmol/L — ABNORMAL LOW (ref 98–111)
Creatinine, Ser: 2.56 mg/dL — ABNORMAL HIGH (ref 0.44–1.00)
GFR, Estimated: 19 mL/min — ABNORMAL LOW (ref >60)
Glucose, Bld: 138 mg/dL — ABNORMAL HIGH (ref 70–99)
Potassium: 2.6 mmol/L — CL (ref 3.5–5.1)
Sodium: 134 mmol/L — ABNORMAL LOW (ref 135–145)
Total Bilirubin: 2.1 mg/dL — ABNORMAL HIGH (ref 0.0–1.2)
Total Protein: 7 g/dL (ref 6.5–8.1)

## 2024-08-19 LAB — CBC WITH DIFFERENTIAL/PLATELET
Abs Immature Granulocytes: 0.03 K/uL (ref 0.00–0.07)
Basophils Absolute: 0.1 K/uL (ref 0.0–0.1)
Basophils Relative: 1 %
Eosinophils Absolute: 0.1 K/uL (ref 0.0–0.5)
Eosinophils Relative: 1 %
HCT: 45.3 % (ref 36.0–46.0)
Hemoglobin: 14.3 g/dL (ref 12.0–15.0)
Immature Granulocytes: 1 %
Lymphocytes Relative: 6 %
Lymphs Abs: 0.4 K/uL — ABNORMAL LOW (ref 0.7–4.0)
MCH: 24.2 pg — ABNORMAL LOW (ref 26.0–34.0)
MCHC: 31.6 g/dL (ref 30.0–36.0)
MCV: 76.8 fL — ABNORMAL LOW (ref 80.0–100.0)
Monocytes Absolute: 0.8 K/uL (ref 0.1–1.0)
Monocytes Relative: 12 %
Neutro Abs: 5 K/uL (ref 1.7–7.7)
Neutrophils Relative %: 79 %
Platelets: 293 K/uL (ref 150–400)
RBC: 5.9 MIL/uL — ABNORMAL HIGH (ref 3.87–5.11)
RDW: 22.9 % — ABNORMAL HIGH (ref 11.5–15.5)
Smear Review: NORMAL
WBC: 6.3 K/uL (ref 4.0–10.5)
nRBC: 0 % (ref 0.0–0.2)

## 2024-08-19 LAB — POTASSIUM: Potassium: 3.1 mmol/L — ABNORMAL LOW (ref 3.5–5.1)

## 2024-08-19 LAB — BRAIN NATRIURETIC PEPTIDE: B Natriuretic Peptide: 242.5 pg/mL — ABNORMAL HIGH (ref 0.0–100.0)

## 2024-08-19 LAB — MAGNESIUM: Magnesium: 2 mg/dL (ref 1.7–2.4)

## 2024-08-19 LAB — GLUCOSE, CAPILLARY: Glucose-Capillary: 136 mg/dL — ABNORMAL HIGH (ref 70–99)

## 2024-08-19 MED ORDER — POLYETHYLENE GLYCOL 3350 17 G PO PACK
17.0000 g | PACK | Freq: Two times a day (BID) | ORAL | Status: DC
  Administered 2024-08-22 – 2024-08-23 (×2): 17 g via ORAL
  Filled 2024-08-19 (×8): qty 1

## 2024-08-19 MED ORDER — POTASSIUM CHLORIDE CRYS ER 20 MEQ PO TBCR
40.0000 meq | EXTENDED_RELEASE_TABLET | Freq: Once | ORAL | Status: AC
  Administered 2024-08-19: 40 meq via ORAL
  Filled 2024-08-19: qty 2

## 2024-08-19 MED ORDER — SODIUM CHLORIDE 0.9 % IV SOLN: 250.0000 mL | INTRAVENOUS | Status: AC | PRN

## 2024-08-19 MED ORDER — LATANOPROST 0.005 % OP SOLN
1.0000 [drp] | Freq: Every day | OPHTHALMIC | Status: DC
  Administered 2024-08-19 – 2024-08-23 (×5): 1 [drp] via OPHTHALMIC
  Filled 2024-08-19: qty 2.5

## 2024-08-19 MED ORDER — ACETAMINOPHEN 325 MG PO TABS: 650.0000 mg | ORAL_TABLET | Freq: Four times a day (QID) | ORAL | Status: DC | PRN

## 2024-08-19 MED ORDER — MIDODRINE HCL 5 MG PO TABS
10.0000 mg | ORAL_TABLET | Freq: Three times a day (TID) | ORAL | Status: DC
  Administered 2024-08-19: 10 mg via ORAL
  Filled 2024-08-19: qty 2

## 2024-08-19 MED ORDER — METOPROLOL TARTRATE 5 MG/5ML IV SOLN: 5.0000 mg | INTRAVENOUS | Status: DC | PRN

## 2024-08-19 MED ORDER — SODIUM CHLORIDE 0.9% FLUSH: 3.0000 mL | INTRAVENOUS | Status: DC | PRN

## 2024-08-19 MED ORDER — PANTOPRAZOLE SODIUM 40 MG PO TBEC
40.0000 mg | DELAYED_RELEASE_TABLET | Freq: Two times a day (BID) | ORAL | Status: DC
  Administered 2024-08-19 – 2024-08-23 (×9): 40 mg via ORAL
  Filled 2024-08-19 (×9): qty 1

## 2024-08-19 MED ORDER — BISACODYL 5 MG PO TBEC: 5.0000 mg | DELAYED_RELEASE_TABLET | Freq: Every day | ORAL | Status: DC | PRN

## 2024-08-19 MED ORDER — ASPIRIN 81 MG PO TBEC
81.0000 mg | DELAYED_RELEASE_TABLET | Freq: Every day | ORAL | Status: DC
  Administered 2024-08-20 – 2024-08-23 (×4): 81 mg via ORAL
  Filled 2024-08-19 (×4): qty 1

## 2024-08-19 MED ORDER — OXYCODONE HCL 5 MG PO TABS
5.0000 mg | ORAL_TABLET | Freq: Four times a day (QID) | ORAL | Status: DC | PRN
  Administered 2024-08-20 – 2024-08-22 (×3): 5 mg via ORAL
  Filled 2024-08-19 (×4): qty 1

## 2024-08-19 MED ORDER — SPIRONOLACTONE 25 MG PO TABS
25.0000 mg | ORAL_TABLET | Freq: Every day | ORAL | Status: DC
Start: 2024-08-20 — End: 2024-08-20

## 2024-08-19 MED ORDER — HEPARIN SODIUM (PORCINE) 5000 UNIT/ML IJ SOLN
5000.0000 [IU] | Freq: Two times a day (BID) | INTRAMUSCULAR | Status: DC
  Administered 2024-08-19 – 2024-08-23 (×9): 5000 [IU] via SUBCUTANEOUS
  Filled 2024-08-19 (×9): qty 1

## 2024-08-19 MED ORDER — SODIUM CHLORIDE 0.9% FLUSH
3.0000 mL | Freq: Two times a day (BID) | INTRAVENOUS | Status: DC
  Administered 2024-08-19 – 2024-08-23 (×9): 3 mL via INTRAVENOUS

## 2024-08-19 MED ORDER — ACETAMINOPHEN 325 MG PO TABS
650.0000 mg | ORAL_TABLET | ORAL | Status: DC | PRN
  Administered 2024-08-21 – 2024-08-23 (×2): 650 mg via ORAL
  Filled 2024-08-19 (×2): qty 2

## 2024-08-19 MED ORDER — POTASSIUM CHLORIDE 10 MEQ/100ML IV SOLN
10.0000 meq | INTRAVENOUS | Status: AC
Start: 2024-08-19 — End: 2024-08-19
  Administered 2024-08-19: 10 meq via INTRAVENOUS
  Filled 2024-08-19: qty 100

## 2024-08-19 MED ORDER — TEMAZEPAM 15 MG PO CAPS
15.0000 mg | ORAL_CAPSULE | Freq: Every evening | ORAL | Status: DC | PRN
  Administered 2024-08-22: 15 mg via ORAL
  Filled 2024-08-19: qty 1

## 2024-08-19 MED ORDER — FUROSEMIDE 10 MG/ML IJ SOLN
40.0000 mg | Freq: Two times a day (BID) | INTRAMUSCULAR | Status: DC
  Administered 2024-08-19 – 2024-08-21 (×4): 40 mg via INTRAVENOUS
  Filled 2024-08-19 (×4): qty 4

## 2024-08-19 MED ORDER — ALLOPURINOL 100 MG PO TABS
100.0000 mg | ORAL_TABLET | Freq: Every day | ORAL | Status: DC
  Administered 2024-08-20 – 2024-08-23 (×4): 100 mg via ORAL
  Filled 2024-08-19 (×4): qty 1

## 2024-08-19 MED ORDER — INSULIN ASPART 100 UNIT/ML IJ SOLN
0.0000 [IU] | Freq: Three times a day (TID) | INTRAMUSCULAR | Status: DC
  Administered 2024-08-19 – 2024-08-20 (×2): 2 [IU] via SUBCUTANEOUS
  Administered 2024-08-20 (×2): 1 [IU] via SUBCUTANEOUS
  Administered 2024-08-21 – 2024-08-22 (×3): 2 [IU] via SUBCUTANEOUS
  Administered 2024-08-22 – 2024-08-23 (×2): 1 [IU] via SUBCUTANEOUS
  Administered 2024-08-23: 2 [IU] via SUBCUTANEOUS
  Filled 2024-08-19 (×10): qty 1

## 2024-08-19 MED ORDER — ONDANSETRON HCL 4 MG/2ML IJ SOLN
4.0000 mg | Freq: Four times a day (QID) | INTRAMUSCULAR | Status: DC | PRN
Start: 2024-08-19 — End: 2024-08-24

## 2024-08-19 NOTE — Telephone Encounter (Signed)
 Pt c/o swelling/edema: STAT if pt has developed SOB within 24 hours  If swelling, where is the swelling located? Pretty much all over, Hands legs, stomach arms   How much weight have you gained and in what time span? 10 to 15    Have you gained 2 pounds in a day or 5 pounds in a week? Yes   Do you have a log of your daily weights (if so, list)? Na   Are you currently taking a fluid pill? She is on 3 different ones   Are you currently SOB? No SOB right now   Have you traveled recently in a car or plane for an extended period of time? NA   [employer]

## 2024-08-19 NOTE — ED Provider Notes (Signed)
 [facility] Provider Note    Event Date/Time   First MD Initiated Contact with Patient 08/19/24 1333     (approximate)   History   Abnormal Labs   HPI  Karen Farley is a 78 y.o. female  medical history significant of IIDM, HTN, HLD, advanced chronic HFpEF, constrictive pericarditis, chronic A-fib, CKD stage IV, chronic hyponatremia, chronic lymphedema who presents for from [facility] with concerns for hypervolemia.  Patient states that for the past week she has had worsening upper extremity and lower extremity edema along with abdomen distention.  She feels generally weak.  She had blood work and the results returned yesterday with an elevated BNP.  She was sent to our facility for concern for this.  She denies any headache, any chest pain.  She reports shortness of breath at baseline and this is unchanged.  She denies any new extremity weakness or sensation changes.  Her husband presents with her and contributes to the history      Physical Exam   Triage Vital Signs: ED Triage Vitals  Encounter Vitals Group     BP 08/19/24 1210 (!) 90/52     Girls Systolic BP Percentile --      Girls Diastolic BP Percentile --      Boys Systolic BP Percentile --      Boys Diastolic BP Percentile --      Pulse Rate 08/19/24 1210 91     Resp 08/19/24 1210 18     Temp 08/19/24 1210 97.7 F (36.5 C)     Temp Source 08/19/24 1210 Oral     SpO2 08/19/24 1210 93 %     Weight --      Height --      Head Circumference --      [facility] Flow --      Pain Score 08/19/24 1209 0     Pain Loc --      Pain Education --      Exclude from Growth Chart --     Most recent vital signs: Vitals:   08/19/24 1335 08/19/24 1342  BP: 103/70 103/70  Pulse:  91  Resp:  18  Temp:  97.7 F (36.5 C)  SpO2:  94%    Nursing Triage Note reviewed. Vital signs reviewed and patients oxygen saturation is normoxic  General: Patient is well nourished, well developed, awake and  alert, resting comfortably in no acute distress Head: Normocephalic and atraumatic Eyes: Normal inspection, extraocular muscles intact, no conjunctival pallor Ear, nose, throat: Normal external exam Neck: Normal range of motion Respiratory: Patient is in no respiratory distress, lungs CTAB Cardiovascular: Patient is irregular, RR  GI: Abd SNT with no guarding or rebound  Extremities: pulses intact with good cap refills, 2+ LE pitting edema or calf tenderness Left lower extremity in walking boot Neuro: The patient is alert and oriented to person, place, and time, appropriately conversive, with 5/5 bilat UE/LE strength, no gross motor or sensory defects noted. Coordination appears to be adequate. Skin: Warm, dry, and intact Psych: normal mood and affect, no SI or HI  ED Results / Procedures / Treatments   Labs (all labs ordered are listed, but only abnormal results are displayed) Labs Reviewed  CBC WITH DIFFERENTIAL/PLATELET - Abnormal; Notable for the following components:      Result Value   RBC 5.90 (*)    MCV 76.8 (*)    MCH 24.2 (*)    RDW 22.9 (*)  Lymphs Abs 0.4 (*)    All other components within normal limits  COMPREHENSIVE METABOLIC PANEL WITH GFR - Abnormal; Notable for the following components:   Sodium 134 (*)    Potassium 2.6 (*)    Chloride 88 (*)    Glucose, Bld 138 (*)    BUN 72 (*)    Creatinine, Ser 2.56 (*)    Albumin  3.3 (*)    Alkaline Phosphatase 137 (*)    Total Bilirubin 2.1 (*)    GFR, Estimated 19 (*)    Anion gap 18 (*)    All other components within normal limits  BRAIN NATRIURETIC PEPTIDE - Abnormal; Notable for the following components:   B Natriuretic Peptide 242.5 (*)    All other components within normal limits  MAGNESIUM      EKG EKG and rhythm strip are interpreted by myself:   EKG: irregular rhythm] at heart rate of 71, normal QRS duration, QTc 447, nonspecific ST segments and T waves no ectopy EKG not consistent with Acute  STEMI Rhythm strip: afib in lead II   RADIOLOGY Xray chest: There is evidence of pleural edema cannot rule out infection    PROCEDURES:  Critical Care performed: No  Procedures   MEDICATIONS ORDERED IN ED: Medications  potassium chloride  10 mEq in 100 mL IVPB (10 mEq Intravenous New Bag/Given 08/19/24 1544)  potassium chloride  SA (KLOR-CON  M) CR tablet 40 mEq (40 mEq Oral Given 08/19/24 1439)     IMPRESSION / MDM / ASSESSMENT AND PLAN / ED COURSE                                Differential diagnosis includes, but is not limited to, CHF exacerbation, electrolyte derangement, acute renal insufficiency, anemia   ED course: Patient presents and has significant hypokalemia and repletion was initiated.  Likely I do not see any profound EKG changes.  BNP is slightly more elevated than baseline and chest x-ray does demonstrate some pleural edema.  She does have a slight acute renal insufficiency.  Given concern for further diuresis and low potassium, case discussed with hospitalist for admission to keep on the monitor and to balance these two medical needs.    Clinical Course as of 08/19/24 1604  Thu Aug 19, 2024  1530 Case discussed with hospitalist for admission [HD]    Clinical Course User Index [HD] Nicholaus Rolland BRAVO, MD   -- Risk: 5 This patient has a high risk of morbidity due to further diagnostic testing or treatment. Rationale: This patient's evaluation and management involve a high risk of morbidity due to the potential severity of presenting symptoms, need for diagnostic testing, and/or initiation of treatment that may require close monitoring. The differential includes conditions with potential for significant deterioration or requiring escalation of care. Treatment decisions in the ED, including medication administration, procedural interventions, or disposition planning, reflect this level of risk. COPA: 5 The patient has the following acute or chronic illness/injury  that poses a possible threat to life or bodily function: [X] : The patient has a potentially serious acute condition or an acute exacerbation of a chronic illness requiring urgent evaluation and management in the Emergency Department. The clinical presentation necessitates immediate consideration of life-threatening or function-threatening diagnoses, even if they are ultimately ruled out.   FINAL CLINICAL IMPRESSION(S) / ED DIAGNOSES   Final diagnoses:  Hypokalemia  Hypervolemia, unspecified hypervolemia type  Acute renal insufficiency     Rx /  DC Orders   ED Discharge Orders     None        Note:  This document was prepared using Dragon voice recognition software and may include unintentional dictation errors.   Nicholaus Rolland BRAVO, MD 08/19/24 [phone]

## 2024-08-19 NOTE — Telephone Encounter (Signed)
 Spoke with DOD recommends ER for fluid removal - called facility to advise pt needing to go to ER

## 2024-08-19 NOTE — ED Notes (Signed)
 Patient stated she wears briefs and was unable to hold her urine. Patient was changed into a dry brief

## 2024-08-19 NOTE — Telephone Encounter (Signed)
 Rehab facility calling to advise pt has gained 15 lb in 2 days - on spiro 50 mg daily and demedex 40 mg daily - status post fall w/ankle fx  Please advise - increase meds, OV, or ER

## 2024-08-19 NOTE — ED Triage Notes (Signed)
 Patient to ED via [facility] for abnormal labs; had elevated BNP that was drawn on 08/11/24. Has edema to bilateral upper and lower extremities.

## 2024-08-19 NOTE — H&P (Signed)
 History and Physical    Karen Farley FMW:[id number] DOB: 01/25/1946 DOA: 08/19/2024  PCP: Vicci Duwaine SQUIBB, DO (Confirm with patient/family/NH records and if not entered, this has to be entered at [facility] point of entry) Patient coming from: SNF  I have personally briefly reviewed patient's old medical records in [facility]  Chief Complaint: Feeling weak  HPI: Karen Farley is a 78 y.o. female with medical history significant of IIDM, HTN, orthostatic hypotension on midodrine , HLD, chronic HFpEF, constrictive pericarditis, chronic A-fib, CKD stage IV, chronic lymphedema sent from nursing home for evaluation of severe hypokalemia.  Patient has been feeling generalized weakness but otherwise no significant complaints.  She was hospitalized about 7 weeks ago for a left ankle fracture, the fracture was considered to be nonoperable and patient was placed on a cam boot and discharged to nursing home.  Due to the instruction of none weightbearing, patient has been largely bedbound for the last 4+ weeks only until this week patient started to have some light physical activity.  She has been taking torsemide  plus metolazone  for chronic peripheral edema and last few days she has been feeling increasing weak.  Denied any shortness of breath cough or orthopnea, no fever or chills.  Today, outpatient blood work showed severe hypokalemia and patient sent to ED.  ED Course: Afebrile, blood pressure 103/70, O2 saturation 100% on room air.  Chest x-ray.  Showing pulmonary congestion with small bilateral pleural effusions, blood work showed K2.6, BUN 72 creatinine 2.5 compared to baseline 2.0-2.2.  Patient was given IV and p.o. potassium replacement in the ED.  Review of Systems: As per HPI otherwise 14 point review of systems negative.    Past Medical History:  Diagnosis Date   A-fib [facility])    CHF (congestive heart failure) (HCC)    Chronic heart failure with preserved ejection fraction (HFpEF)  (HCC)    a. 11/2019 Echo: EF 60-65%; b. 07/2021 Echo: EF 60-65%.   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 07/2021 req 2u prbcs. Eval deferred given resolution of bleeding and cardiac issues.   Hyperlipidemia    Hypertension    Lymphedema    Morbid obesity (HCC)    Osteoarthritis of both knees    Osteopenia    Pericardial effusion    a. 07/2021 Echo: Nl EF. Large pericardial effusion-->s/p pericardiocentesis of ; b. 08/23/2021 Echo: EF 60-65%, mild LVH, Nl RV fxn, sev dil LA, moderate pericardial effusion. L pleural effusion.   Permanent atrial fibrillation (HCC)    Pleural effusion, left    a. 07/2021 s/p thoracentesis - .    Past Surgical History:  Procedure Laterality Date   BASAL CELL CARCINOMA EXCISION  11/01/2022   CATARACT EXTRACTION W/PHACO Left 10/09/2021   Procedure: CATARACT EXTRACTION PHACO AND INTRAOCULAR LENS PLACEMENT (IOC) LEFT DIABETIC 4.84 00:37.8;  Surgeon: Jaye Fallow, MD;  Location: [facility];  Service: Ophthalmology;  Laterality: Left;  Diabetic   CATARACT EXTRACTION W/PHACO Right 10/30/2021   Procedure: CATARACT EXTRACTION PHACO AND INTRAOCULAR LENS PLACEMENT (IOC) RIGHT DIABETIC 5.65 00:38.5;  Surgeon: Jaye Fallow, MD;  Location: [facility];  Service: Ophthalmology;  Laterality: Right;  Diabetic   CHOLECYSTECTOMY     DILATION AND CURETTAGE OF UTERUS  IR FLUORO GUIDE CV LINE RIGHT  08/16/2021   IR THORACENTESIS ASP PLEURAL SPACE W/IMG GUIDE  08/16/2021   PERICARDIOCENTESIS N/A 08/05/2021   Procedure: PERICARDIOCENTESIS;  Surgeon: Ammon Blunt, MD;  Location: [facility] INVASIVE CV LAB;  Service: Cardiovascular;  Laterality: N/A;   RIGHT AND LEFT HEART CATH N/A 03/29/2024   Procedure: RIGHT AND LEFT HEART CATH;   Surgeon: Zenaida Morene PARAS, MD;  Location: [facility] INVASIVE CV LAB;  Service: Cardiovascular;  Laterality: N/A;   RIGHT HEART CATH N/A 03/22/2024   Procedure: RIGHT HEART CATH;  Surgeon: Darron Deatrice LABOR, MD;  Location: [facility] INVASIVE CV LAB;  Service: Cardiovascular;  Laterality: N/A;   TEAR DUCT PROBING WITH STRABISMUS REPAIR Right    TONSILLECTOMY       reports that she has never smoked. She has never used smokeless tobacco. She reports that she does not currently use alcohol. She reports that she does not use drugs.  No Known Allergies  Family History  Problem Relation Age of Onset   Heart disease Mother        CHF   Osteoporosis Mother    Breast cancer Mother    Heart disease Father 60   Cancer Brother        Colon CA- 2002- Youngest brother   Parkinson's disease Brother        Younger Brother   Heart disease Brother        2 brothers   Diabetes Brother    Cancer Brother    Breast cancer Maternal Grandmother    Prior to Admission medications   Medication Sig Start Date End Date Taking? Authorizing Provider  LANTUS  SOLOSTAR 100 UNIT/ML Solostar Pen Inject into the skin. 08/05/24  Yes [provider]  metFORMIN  (GLUCOPHAGE ) 1000 MG tablet Take 1,000 mg by mouth daily. 08/06/24  Yes [provider]  midodrine  (PROAMATINE ) 10 MG tablet Take 10 mg by mouth 3 (three) times daily. Take 1 tablet (10 mg total) by mouth in the morning and 1 tablet (10 mg total) in the evening and 1 tablet (10 mg total) before bedtime. 06/17/24 09/15/24 Yes [provider]  spironolactone  (ALDACTONE ) 25 MG tablet Take 25 mg by mouth daily. 08/09/24  Yes [provider]  traMADol  (ULTRAM ) 50 MG tablet Take 50 mg by mouth daily as needed. 04/15/24  Yes [provider]  acetaminophen  (TYLENOL ) 325 MG tablet Take 2 tablets (650 mg total) by mouth every 6 (six) hours as needed for mild pain (pain score 1-3) or fever (or Fever >/= 101). 06/30/24   Dorinda Drue DASEN, MD   allopurinol  (ZYLOPRIM ) 100 MG tablet Take 1 tablet (100 mg total) by mouth daily. 12/19/23   Johnson, [name], DO  aspirin  EC 81 MG tablet Take 1 tablet (81 mg total) by mouth daily. Swallow whole. 07/01/24   Dorinda Drue DASEN, MD  bisacodyl  (DULCOLAX) 5 MG EC tablet Take 1 tablet (5 mg total) by mouth daily as needed for moderate constipation. 06/30/24   Dorinda Drue DASEN, MD  empagliflozin  (JARDIANCE ) 25 MG TABS tablet Take 1 tablet (25 mg total) by mouth daily. 07/01/24   Dorinda Drue DASEN, MD  latanoprost  (XALATAN ) 0.005 % ophthalmic solution Place 1 drop into both eyes at bedtime.    [provider]  metolazone  (ZAROXOLYN ) 5 MG tablet Take 5 mg by mouth daily.    [provider]  oxyCODONE  (OXY IR/ROXICODONE ) 5 MG immediate release tablet Take 1 tablet (5 mg total) by mouth every 6 (six) hours  as needed for severe pain (pain score 7-10) or breakthrough pain. 06/30/24   Dorinda Drue DASEN, MD  pantoprazole  (PROTONIX ) 40 MG tablet Take 1 tablet (40 mg total) by mouth 2 (two) times daily. 12/19/23   Vicci Bouchard P, DO  polyethylene glycol (MIRALAX  / GLYCOLAX ) 17 g packet Take 17 g by mouth 2 (two) times daily. 06/30/24   Dorinda Drue DASEN, MD  potassium chloride  SA (KLOR-CON  M) 20 MEQ tablet Take 20 mEq by mouth 2 (two) times daily.    [provider]  spironolactone  (ALDACTONE ) 50 MG tablet Take 1 tablet (50 mg total) by mouth daily. Patient not taking: Reported on 08/19/2024 12/19/23   Johnson, [name], DO  temazepam (RESTORIL) 15 MG capsule Take 15 mg by mouth at bedtime as needed.    [provider]  torsemide  (DEMADEX ) 20 MG tablet 2 tablets by mouth daily 05/24/24   Lenetta No D, NP    Physical Exam: Vitals:   08/19/24 1210 08/19/24 1335 08/19/24 1342  BP: (!) 90/52 103/70 103/70  Pulse: 91  91  Resp: 18  18  Temp: 97.7 F (36.5 C)  97.7 F (36.5 C)  TempSrc: Oral  Oral  SpO2: 93%  94%    Constitutional: NAD, calm, comfortable Vitals:   08/19/24 1210 08/19/24 1335  08/19/24 1342  BP: (!) 90/52 103/70 103/70  Pulse: 91  91  Resp: 18  18  Temp: 97.7 F (36.5 C)  97.7 F (36.5 C)  TempSrc: Oral  Oral  SpO2: 93%  94%   Eyes: PERRL, lids and conjunctivae normal ENMT: Mucous membranes are moist. Posterior pharynx clear of any exudate or lesions.Normal dentition.  Neck: normal, supple, no masses, no thyromegaly Respiratory: clear to auscultation bilaterally, no wheezing, scattered crackles on bilateral lower fields, increasing breathing effort respiratory effort. No accessory muscle use.  Cardiovascular: Irregular heart rate, systolic murmur on heart base. 2+ extremity edema. 2+ pedal pulses. No carotid bruits.  Abdomen: no tenderness, no masses palpated. No hepatosplenomegaly. Bowel sounds positive.  Musculoskeletal: no clubbing / cyanosis. No joint deformity upper and lower extremities. Good ROM, no contractures. Normal muscle tone.  Skin: no rashes, lesions, ulcers. No induration Neurologic: CN 2-12 grossly intact. Sensation intact, DTR normal. Strength 5/5 in all 4.  Psychiatric: Normal judgment and insight. Alert and oriented x 3. Normal mood.    Labs on Admission: I have personally reviewed following labs and imaging studies  CBC: Recent Labs  Lab 08/19/24 1212  WBC 6.3  NEUTROABS 5.0  HGB 14.3  HCT 45.3  MCV 76.8*  PLT 293   Basic Metabolic Panel: Recent Labs  Lab 08/19/24 1212  NA 134*  K 2.6*  CL 88*  CO2 28  GLUCOSE 138*  BUN 72*  CREATININE 2.56*  CALCIUM  9.4  MG 2.0   GFR: CrCl cannot be calculated (Unknown ideal weight.). Liver Function Tests: Recent Labs  Lab 08/19/24 1212  AST 29  ALT 10  ALKPHOS 137*  BILITOT 2.1*  PROT 7.0  ALBUMIN  3.3*   No results for input(s): LIPASE, AMYLASE in the last 168 hours. No results for input(s): AMMONIA in the last 168 hours. Coagulation Profile: No results for input(s): INR, PROTIME in the last 168 hours. Cardiac Enzymes: No results for input(s): CKTOTAL,  CKMB, CKMBINDEX, TROPONINI in the last 168 hours. BNP (last 3 results) No results for input(s): PROBNP in the last 8760 hours. HbA1C: No results for input(s): HGBA1C in the last 72 hours. CBG: No results for input(s):  GLUCAP in the last 168 hours. Lipid Profile: No results for input(s): CHOL, HDL, LDLCALC, TRIG, CHOLHDL, LDLDIRECT in the last 72 hours. Thyroid  Function Tests: No results for input(s): TSH, T4TOTAL, FREET4, T3FREE, THYROIDAB in the last 72 hours. Anemia Panel: No results for input(s): VITAMINB12, FOLATE, FERRITIN, TIBC, IRON , RETICCTPCT in the last 72 hours. Urine analysis:    Component Value Date/Time   COLORURINE YELLOW (A) 04/11/2021 1215   APPEARANCEUR Clear 12/13/2022 1322   LABSPEC 1.011 04/11/2021 1215   PHURINE 5.0 04/11/2021 1215   GLUCOSEU 2+ (A) 12/13/2022 1322   HGBUR NEGATIVE 04/11/2021 1215   BILIRUBINUR Negative 12/13/2022 1322   KETONESUR NEGATIVE 04/11/2021 1215   PROTEINUR Negative 12/13/2022 1322   PROTEINUR NEGATIVE 04/11/2021 1215   NITRITE Negative 12/13/2022 1322   NITRITE NEGATIVE 04/11/2021 1215   LEUKOCYTESUR Negative 12/13/2022 1322   LEUKOCYTESUR MODERATE (A) 04/11/2021 1215    Radiological Exams on Admission: DG Chest 2 View Result Date: 08/19/2024 EXAM: 2 VIEW(S) XRAY OF THE CHEST 08/19/2024 02:32:00 PM COMPARISON: 03/22/2024 CLINICAL HISTORY: Evaluate for pleural edeam FINDINGS: LINES, TUBES AND DEVICES: Numerous leads and wires over chest. LUNGS AND PLEURA: Bibasilar airspace disease. Small bilateral pleural effusions. Mild interstitial edema suspected. No pneumothorax. HEART AND MEDIASTINUM: Mild cardiomegaly. Atherosclerosis in transverse aorta. BONES AND SOFT TISSUES: No acute osseous abnormality. IMPRESSION: 1. Small bilateral pleural effusions and associated basilar airspace opacities. Favor edema. Atelectasis or infection could appear similarly. Electronically signed by: Norman Gatlin MD 08/19/2024 02:39 PM EDT RP Workstation: [id number]    EKG: Independently reviewed.  Chronic A-fib, rate controlled, chronic LBBB  Assessment/Plan Principal Problem:   Hypokalemia Active Problems:   Cirrhosis of liver with ascites (HCC)   Chronic diastolic CHF (congestive heart failure) (HCC)  (please populate well all problems here in Problem List. (For example, if patient is on BP meds at home and you resume or decide to hold them, it is a problem that needs to be her. Same for CAD, COPD, HLD and so on)  Severe hypokalemia -Likely secondary to diuresis - IV and p.o. replacement - Rate check potassium level tonight - Magnesium  level= 2.0, will check phosphorus level  Acute on chronic HFpEF decompensation History of mild to moderate aortic stenosis History of moderate MR - With symptoms signs of fluid overload - Change p.o. diuresis to IV Lasix  40 mg twice daily x 2 doses - Monitor potassium level - Hold off metolazone   Chronic A-fib - Rate controlled, will use as needed metoprolol  if needed - Not on anticoagulation, used to be on Eliquis  which was discontinued in June probably because of newly developed iron  deficiency anemia. - Continue aspirin   CKD stage IV - Volume overloaded, creatinine level stable - IV diuresis as above  Chronic hypotension - BP stable, continue midodrine   IIDM - SSI  Stage II pressure ulcer, POA - Developed over the last few weeks due to immobility - Status post 2 weeks of IV antibiotics recently. - Outpatient wound care center follow-up  DVT prophylaxis: Heparin  subcu Code Status: Full code Family Communication: Husband at bedside Disposition Plan: Expect less than 2 midnight hospital stay Consults called: None Admission status: Telemetry observation   Cort ONEIDA Mana MD [facility] Pager [phone]  08/19/2024, 4:31 PM

## 2024-08-20 ENCOUNTER — Observation Stay: Admit: 2024-08-20

## 2024-08-20 DIAGNOSIS — Z794 Long term (current) use of insulin: Secondary | ICD-10-CM | POA: Diagnosis not present

## 2024-08-20 DIAGNOSIS — I311 Chronic constrictive pericarditis: Secondary | ICD-10-CM | POA: Diagnosis present

## 2024-08-20 DIAGNOSIS — W010XXD Fall on same level from slipping, tripping and stumbling without subsequent striking against object, subsequent encounter: Secondary | ICD-10-CM | POA: Diagnosis present

## 2024-08-20 DIAGNOSIS — I080 Rheumatic disorders of both mitral and aortic valves: Secondary | ICD-10-CM | POA: Diagnosis present

## 2024-08-20 DIAGNOSIS — Z7982 Long term (current) use of aspirin: Secondary | ICD-10-CM | POA: Diagnosis not present

## 2024-08-20 DIAGNOSIS — I425 Other restrictive cardiomyopathy: Secondary | ICD-10-CM | POA: Diagnosis not present

## 2024-08-20 DIAGNOSIS — Z7984 Long term (current) use of oral hypoglycemic drugs: Secondary | ICD-10-CM | POA: Diagnosis not present

## 2024-08-20 DIAGNOSIS — I5032 Chronic diastolic (congestive) heart failure: Secondary | ICD-10-CM | POA: Diagnosis not present

## 2024-08-20 DIAGNOSIS — Z7401 Bed confinement status: Secondary | ICD-10-CM | POA: Diagnosis not present

## 2024-08-20 DIAGNOSIS — E785 Hyperlipidemia, unspecified: Secondary | ICD-10-CM | POA: Diagnosis present

## 2024-08-20 DIAGNOSIS — I4821 Permanent atrial fibrillation: Secondary | ICD-10-CM | POA: Diagnosis present

## 2024-08-20 DIAGNOSIS — I959 Hypotension, unspecified: Secondary | ICD-10-CM

## 2024-08-20 DIAGNOSIS — E1122 Type 2 diabetes mellitus with diabetic chronic kidney disease: Secondary | ICD-10-CM | POA: Diagnosis present

## 2024-08-20 DIAGNOSIS — I5033 Acute on chronic diastolic (congestive) heart failure: Secondary | ICD-10-CM

## 2024-08-20 DIAGNOSIS — I130 Hypertensive heart and chronic kidney disease with heart failure and stage 1 through stage 4 chronic kidney disease, or unspecified chronic kidney disease: Secondary | ICD-10-CM | POA: Diagnosis present

## 2024-08-20 DIAGNOSIS — D509 Iron deficiency anemia, unspecified: Secondary | ICD-10-CM | POA: Diagnosis present

## 2024-08-20 DIAGNOSIS — I5082 Biventricular heart failure: Secondary | ICD-10-CM | POA: Diagnosis present

## 2024-08-20 DIAGNOSIS — T502X5A Adverse effect of carbonic-anhydrase inhibitors, benzothiadiazides and other diuretics, initial encounter: Secondary | ICD-10-CM | POA: Diagnosis present

## 2024-08-20 DIAGNOSIS — E876 Hypokalemia: Secondary | ICD-10-CM

## 2024-08-20 DIAGNOSIS — Z79899 Other long term (current) drug therapy: Secondary | ICD-10-CM | POA: Diagnosis not present

## 2024-08-20 DIAGNOSIS — Z8249 Family history of ischemic heart disease and other diseases of the circulatory system: Secondary | ICD-10-CM | POA: Diagnosis not present

## 2024-08-20 DIAGNOSIS — I272 Pulmonary hypertension, unspecified: Secondary | ICD-10-CM | POA: Diagnosis present

## 2024-08-20 DIAGNOSIS — N184 Chronic kidney disease, stage 4 (severe): Secondary | ICD-10-CM | POA: Diagnosis present

## 2024-08-20 DIAGNOSIS — R188 Other ascites: Secondary | ICD-10-CM | POA: Diagnosis present

## 2024-08-20 DIAGNOSIS — K7460 Unspecified cirrhosis of liver: Secondary | ICD-10-CM | POA: Diagnosis present

## 2024-08-20 DIAGNOSIS — I3139 Other pericardial effusion (noninflammatory): Secondary | ICD-10-CM | POA: Diagnosis present

## 2024-08-20 DIAGNOSIS — L89153 Pressure ulcer of sacral region, stage 3: Secondary | ICD-10-CM | POA: Diagnosis present

## 2024-08-20 LAB — GLUCOSE, CAPILLARY
Glucose-Capillary: 122 mg/dL — ABNORMAL HIGH (ref 70–99)
Glucose-Capillary: 124 mg/dL — ABNORMAL HIGH (ref 70–99)
Glucose-Capillary: 153 mg/dL — ABNORMAL HIGH (ref 70–99)
Glucose-Capillary: 137 mg/dL — ABNORMAL HIGH (ref 70–99)

## 2024-08-20 LAB — BASIC METABOLIC PANEL WITH GFR
Anion gap: 17 — ABNORMAL HIGH (ref 5–15)
BUN: 74 mg/dL — ABNORMAL HIGH (ref 8–23)
CO2: 26 mmol/L (ref 22–32)
Calcium: 9.1 mg/dL (ref 8.9–10.3)
Chloride: 91 mmol/L — ABNORMAL LOW (ref 98–111)
Creatinine, Ser: 2.47 mg/dL — ABNORMAL HIGH (ref 0.44–1.00)
GFR, Estimated: 19 mL/min — ABNORMAL LOW (ref >60)
Glucose, Bld: 111 mg/dL — ABNORMAL HIGH (ref 70–99)
Potassium: 3.3 mmol/L — ABNORMAL LOW (ref 3.5–5.1)
Sodium: 134 mmol/L — ABNORMAL LOW (ref 135–145)

## 2024-08-20 MED ORDER — MIDODRINE HCL 5 MG PO TABS: 5.0000 mg | ORAL_TABLET | Freq: Three times a day (TID) | ORAL | Status: DC

## 2024-08-20 MED ORDER — MIDODRINE HCL 5 MG PO TABS
5.0000 mg | ORAL_TABLET | Freq: Three times a day (TID) | ORAL | Status: DC
  Administered 2024-08-20 – 2024-08-23 (×11): 5 mg via ORAL
  Filled 2024-08-20 (×11): qty 1

## 2024-08-20 MED ORDER — POTASSIUM CHLORIDE 20 MEQ PO PACK
40.0000 meq | PACK | Freq: Once | ORAL | Status: AC
  Administered 2024-08-20: 40 meq via ORAL
  Filled 2024-08-20: qty 2

## 2024-08-20 MED ORDER — EMPAGLIFLOZIN 25 MG PO TABS
25.0000 mg | ORAL_TABLET | Freq: Every day | ORAL | Status: DC
  Administered 2024-08-20 – 2024-08-23 (×4): 25 mg via ORAL
  Filled 2024-08-20 (×4): qty 1

## 2024-08-20 MED ORDER — SPIRONOLACTONE 12.5 MG HALF TABLET
12.5000 mg | ORAL_TABLET | Freq: Every day | ORAL | Status: DC
  Administered 2024-08-20 – 2024-08-23 (×4): 12.5 mg via ORAL
  Filled 2024-08-20 (×5): qty 1

## 2024-08-20 NOTE — Plan of Care (Signed)
   Problem: Fluid Volume: Goal: Ability to maintain a balanced intake and output will improve Outcome: Progressing

## 2024-08-20 NOTE — Progress Notes (Signed)
 [facility]  - [place] at [facility]   PATIENT NAME: Karen Farley    MR#:  [id number]  DATE OF BIRTH:  11/02/1945  SUBJECTIVE:  husband at bedside. [place] with increased swelling of her right lower extremity. Her left foot is in splint. Trying to ambulate using walker at the facility. Complains of weakness.    VITALS:  Blood pressure (!) 83/64, pulse 84, temperature 97.8 F (36.6 C), resp. rate 14, height 5' (1.524 m), weight 63.6 kg, SpO2 97%.  PHYSICAL EXAMINATION:   GENERAL:  78 y.o.-year-old patient with no acute distress.  LUNGS: Normal breath sounds bilaterally, no wheezing CARDIOVASCULAR: S1, S2 normal. No murmur   ABDOMEN: Soft, nontender, nondistended. Bowel sounds present.  EXTREMITIES: left lower extremity splint. Right LE +++ edema NEUROLOGIC: nonfocal  patient is alert and awake SKIN: Wound 08/19/24 1700 Pressure Injury Sacrum Medial Stage 3 -  Full thickness tissue loss. Subcutaneous fat may be visible but bone, tendon or muscle are NOT exposed. (Active)      LABORATORY PANEL:  CBC Recent Labs  Lab 08/19/24 1212  WBC 6.3  HGB 14.3  HCT 45.3  PLT 293    Chemistries  Recent Labs  Lab 08/19/24 1212 08/19/24 2015 08/20/24 0328  NA 134*  --  134*  K 2.6*   < > 3.3*  CL 88*  --  91*  CO2 28  --  26  GLUCOSE 138*  --  111*  BUN 72*  --  74*  CREATININE 2.56*  --  2.47*  CALCIUM  9.4  --  9.1  MG 2.0  --   --   AST 29  --   --   ALT 10  --   --   ALKPHOS 137*  --   --   BILITOT 2.1*  --   --    < > = values in this interval not displayed.   Cardiac Enzymes No results for input(s): TROPONINI in the last 168 hours. RADIOLOGY:  US  Venous Img Lower Bilateral (DVT) Result Date: 08/20/2024 CLINICAL DATA:  Lower extremity edema. EXAM: BILATERAL LOWER EXTREMITY VENOUS DOPPLER ULTRASOUND TECHNIQUE: Gray-scale sonography with graded compression, as well as color Doppler and duplex ultrasound were performed to  evaluate the lower extremity deep venous systems from the level of the common femoral vein and including the common femoral, femoral, profunda femoral, popliteal and calf veins including the posterior tibial, peroneal and gastrocnemius veins when visible. The superficial great saphenous vein was also interrogated. Spectral Doppler was utilized to evaluate flow at rest and with distal augmentation maneuvers in the common femoral, femoral and popliteal veins. COMPARISON:  None Available. FINDINGS: RIGHT LOWER EXTREMITY Common Femoral Vein: No evidence of thrombus. Normal compressibility, respiratory phasicity and response to augmentation. Saphenofemoral Junction: No evidence of thrombus. Normal compressibility and flow on color Doppler imaging. Profunda Femoral Vein: No evidence of thrombus. Normal compressibility and flow on color Doppler imaging. Femoral Vein: Flow present throughout the right femoral vein. There is lack of complete compressibility of the distal right femoral vein with suggestion of some posterior mural thrombus. 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 Popliteal Vein: No evidence of thrombus. Normal compressibility, respiratory phasicity and response to augmentation. Calf Veins: No evidence of thrombus. Normal compressibility and flow on color Doppler imaging. Superficial Great Saphenous Vein: No evidence of thrombus. Normal compressibility. Venous Reflux:  None. Other Findings: No evidence of superficial thrombophlebitis or abnormal fluid collection. LEFT  LOWER EXTREMITY Common Femoral Vein: No evidence of thrombus. Normal compressibility, respiratory phasicity and response to augmentation. Saphenofemoral Junction: No evidence of thrombus. Normal compressibility and flow on color Doppler imaging. Profunda Femoral Vein: No evidence of thrombus. Normal compressibility and flow on color Doppler imaging. Femoral Vein: No evidence of thrombus. Normal compressibility,  respiratory phasicity and response to augmentation. Popliteal Vein: No evidence of thrombus. Normal compressibility, respiratory phasicity and response to augmentation. Calf Veins: Left calf veins not adequately visualized for evaluation. Superficial Great Saphenous Vein: No evidence of thrombus. Normal compressibility. Venous Reflux:  None. Other Findings: No evidence of superficial thrombophlebitis or abnormal fluid collection. IMPRESSION: 1. No evidence of acute deep venous thrombosis in either lower extremity. 2. Lack of complete compressibility of the distal right femoral vein with suggestion of some posterior mural thrombus. 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 3. Left calf veins not adequately visualized for evaluation. Electronically Signed   By: Marcey Moan M.D.   On: 08/20/2024 08:21   DG Chest 2 View Result Date: 08/19/2024 EXAM: 2 VIEW(S) XRAY OF THE CHEST 08/19/2024 02:32:00 PM COMPARISON: 03/22/2024 CLINICAL HISTORY: Evaluate for pleural edeam FINDINGS: LINES, TUBES AND DEVICES: Numerous leads and wires over chest. LUNGS AND PLEURA: Bibasilar airspace disease. Small bilateral pleural effusions. Mild interstitial edema suspected. No pneumothorax. HEART AND MEDIASTINUM: Mild cardiomegaly. Atherosclerosis in transverse aorta. BONES AND SOFT TISSUES: No acute osseous abnormality. IMPRESSION: 1. Small bilateral pleural effusions and associated basilar airspace opacities. Favor edema. Atelectasis or infection could appear similarly. Electronically signed by: Norman Gatlin MD 08/19/2024 02:39 PM EDT RP Workstation: [id number]    Assessment and Plan Karen Farley is a 78 y.o. female with medical history significant of IIDM, HTN, orthostatic hypotension on midodrine , HLD, chronic HFpEF, constrictive pericarditis, chronic A-fib, CKD stage IV, chronic lymphedema sent from nursing home for evaluation of severe hypokalemia.  Patient came in with increased  weight gain of about 15 pounds in last few days. She is significantly edema and lower extremity more on the right. Denies any shortness of breath however she has been not much active given her left ankle fracture.  Chest x-ray-- Showing pulmonary congestion with small bilateral pleural effusions,   Severe hypokalemia -Likely secondary to diuresis - IV and p.o. replacement - Rate check potassium level tonight - Magnesium  level= 2.0, will check phosphorus level --K repleted   Acute on chronic HFpEF decompensation/Constrictive pericarditis History of mild to moderate aortic stenosis History of moderate MR - With symptoms signs of fluid overload - Change p.o. diuresis to IV Lasix  40 mg twice dailyx 2 doses - Monitor potassium level - Hold off metolazone  -- cardiology consultation with [facility] cardiology Dr. Darron   Chronic A-fib - Rate controlled, will use as needed metoprolol  if needed - Not on anticoagulation, used to be on Eliquis  which was discontinued in June probably because of newly developed iron  deficiency anemia. - Continue aspirin    CKD stage IV - Volume overloaded, creatinine level stable - IV diuresis as above   Chronic hypotension - BP stable, continue midodrine    IIDM - SSI   Stage II pressure ulcer, POA - Developed over the last few weeks due to immobility - Status post 2 weeks of IV antibiotics recently. - Outpatient wound care center follow-up   H/o Nondisplaced Left lateral malleolus fracture of fibula in Sept 2025 S/p left LE splint --PT to see pt --pt follows with Dr Lennie at [facility]  Procedures: Family communication :husband  Consults :[facility] cardilogy CODE STATUS: full DVT Prophylaxis :heparin  Level of care: Telemetry will require IV Lasix  for diuresis due to volume overload.    TOTAL TIME TAKING CARE OF THIS PATIENT: 40 minutes.  >50% time spent on counselling and coordination of care  Note: This dictation was prepared with Dragon dictation along  with smaller phrase technology. Any transcriptional errors that result from this process are unintentional.  [name] M.D    [facility]   CC: Primary care physician; Vicci Duwaine SQUIBB, DO

## 2024-08-20 NOTE — Progress Notes (Signed)
 Heart Failure Navigator Progress Note Assessed for Heart & Vascular TOC clinic readiness.  Current patient of the Advanced Heart Failure Karen DOROTHA Brownie, MD. Schedule a hospital follow-up on 08/31/2024 @ 8:30 AM at the HF Clinic.  Navigator will sign off at this time.  Charmaine Pines, RN, BSN [facility] Heart Failure Navigator Secure Chat Only

## 2024-08-20 NOTE — Plan of Care (Signed)

## 2024-08-20 NOTE — Progress Notes (Signed)
 PHARMACY CONSULT NOTE - ELECTROLYTES  Pharmacy Consult for Electrolyte Monitoring and Replacement   Recent Labs: Height: 5' (152.4 cm) Weight: 63.6 kg (140 lb 3.4 oz) IBW/kg (Calculated) : 45.5 Estimated Creatinine Clearance: 15.6 mL/min (A) (by C-G formula based on SCr of 2.47 mg/dL (H)). Potassium (mmol/L)  Date Value  08/20/2024 3.3 (L)   Magnesium  (mg/dL)  Date Value  89/69/7974 2.0   Calcium  (mg/dL)  Date Value  89/68/7974 9.1   Albumin  (g/dL)  Date Value  89/69/7974 3.3 (L)  12/19/2023 4.1   Phosphorus (mg/dL)  Date Value  89/69/7974 2.7   Sodium (mmol/L)  Date Value  08/20/2024 134 (L)  12/19/2023 133 (L)   Assessment  Karen Farley is a 78 y.o. female presenting with acute HF and low extremity edema. PMH significant for IIDM, HTN, orthostatic hypotension on midodrine , HLD, chronic HFpEF, constrictive pericarditis, chronic A-fib, CKD stage IV . Pharmacy has been consulted to monitor and replace electrolytes.  Diet: heart healthy/carb modified MIVF: IV lock Pertinent medications: furosemide , spironolactone   Goal of Therapy: Electrolytes WNL, optimizer K and Mg per card indication  Plan:  Kcl 40meq po x 1 dose ordered. Check BMP, Mg  with AM labs   Thierry Dobosz Rodriguez-Guzman PharmD, BCPS 08/20/2024 2:40 PM

## 2024-08-20 NOTE — Procedures (Signed)
 Karen Farley from echo stopped by to do her echo and pt was using restroom at 1:45 pm

## 2024-08-20 NOTE — Care Management Obs Status (Signed)
 MEDICARE OBSERVATION STATUS NOTIFICATION   Patient Details  Name: Karen Farley MRN: [id number] Date of Birth: 30-Apr-1946   Medicare Observation Status Notification Given:  Yes    Karen Farley 08/20/2024, 2:09 PM

## 2024-08-20 NOTE — Consult Note (Signed)
 Cardiology Consultation   Patient ID: Karen Farley MRN: [id number]; DOB: [birth year]  Admit date: 08/19/2024 Date of Consult: 08/20/2024  PCP:  Vicci Duwaine SQUIBB, DO   [facility] Providers Cardiologist:  Evalene Lunger, MD        Patient Profile: Karen Farley is a 78 y.o. 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 Tobie.  History of Present Illness:   Karen Farley had previously been admitted to [facility] in early 07/2023 with DOE found to have a large left pleural effusion requiring thoracentesis.  In October she had rectal bleeding and apixaban  had to be discontinued.  She required transfusion of PRBCs.  CT imaging showed large pericardial effusion.  Echo confirmed large pericardial effusion without tamponade.  Pericardiocentesis was performed yielding 700 mL of fluid.  Eliquis  was then later resumed.  She continued following with nephrology and was told to stop her metolazone  and midodrine  in February 2025.  She followed with Dr. Gollan in 5 oh 2025 her weight was up from 145-158.  Recommendation was to restart metolazone  3 days a week and torsemide  was increased.  She presented to [facility] emergency department 5///25 for shortness of breath, abdominal fullness and lower leg edema.  Reported 15 pound weight gain from her baseline.  She was doubling torsemide  3 times a week and take metolazone  2.5 mg once.  She was having trouble walking from room to room in her condo.  Vital signs are stable labs are significant for sodium of 127, serum creatinine 2.81, BUN 76, sensitivity troponin 26 and 25, BNP of 265.  EKG showed A-fib that was rate controlled.  Chest x-ray revealed no focal consolidation or edema.  Chest CT  showed borderline cardiomegaly, mild diffuse pericardial thickening versus small volume overload.  Echocardiogram revealed an LVEF of 60 to 65%, mild MR, mild MS, severe thickening with aortic valve, right heart catheterization revealed severely elevated right atrial pressure, mild pulmonary hypertension, moderately elevated wedge pressure, and severely reduced cardiac output.  Patient transferred to ICU and started on milrinone  drip, IV diuresis, and Levophed  drip.  PCCM team consulted to assist with management.  She remains on milrinone , Levophed , and vasopressin  drips in the ICU.  Then on 612 care was transitioned to the hospitalist service and all vasopressor drips were weaned off.  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  Recommended optimizing as much as possible returning home with hospice services.  On 6/14 discussion with palliative care and hospice liaison decision was made to proceed with DC home with hospice care.  She was again readmitted to [facility] 9/5 - 06/30/2024 for a left ankle fracture.  On the day before presentation while exiting the car she tripped and got her left ankle between the curbside in the car and fell back into the car when she twisted her left ankle.  X-ray showed acute oblique nondisplaced fracture of the distal fibula.  She was placed in a boot with a compression dressing and would have to keep the boot on for most of the time it was acting as a splint or cast and she was to follow-up with podiatry? as an outpatient.  She was continued on her home medications.  Plan was to hold her midodrine  as well if she was not having symptoms of hypotension.  She was discharged home.   She presented to the [facility] emergency department after calling into the triage line stating that [facility] had been [facility] had blood work and the results returned yesterday and she had elevated BNP and also noted a 15 pound weight gain.  She was sent from peds to the emergency department for fluid  removal.  Patient stated that for the past week she had had worsening upper extremity and lower extremity edema along with abdominal distention.  She feels generally weak.  She denies any headache, chest pain, reports shortness of breath at baseline at baseline that is unchanged.  Denies any new symptoms.  Continues to have a boot on from her ankle fracture.  Initial vital signs reveal blood pressure of 90/52, pulse of 91, respirations of 18, temperature 97.7.  She is maintaining oxygen saturations at 94% on room air.  Labs revealed sodium of 134, potassium 2.6, chloride of 88, blood glucose of 130, BUN of 72, serum creatinine 2.56, albumin  of 3.3, BNP of 242.5, EKG revealed rate controlled atrial fibrillation, chest x-ray showed evidence of some pleural edema.  She was treated with IV and oral potassium.  Cardiology was consulted to assist with management of acute on chronic HFpEF with BiV failure  Past Medical History:  Diagnosis Date   A-fib [facility])    CHF (congestive heart failure) (HCC)    Chronic heart failure with preserved ejection fraction (HFpEF) (HCC)    a. 11/2019 Echo: EF 60-65%; b. 07/2021 Echo: EF 60-65%.   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 07/2021 req 2u prbcs. Eval deferred given resolution of bleeding and cardiac issues.   Hyperlipidemia    Hypertension    Lymphedema    Morbid obesity (HCC)    Osteoarthritis of both knees    Osteopenia    Pericardial effusion    a. 07/2021 Echo: Nl EF. Large pericardial effusion-->s/p pericardiocentesis of ; b. 08/23/2021 Echo: EF 60-65%, mild LVH, Nl RV fxn, sev dil LA, moderate pericardial effusion. L pleural effusion.   Permanent atrial fibrillation (HCC)    Pleural effusion, left    a. 07/2021 s/p thoracentesis - .     Past Surgical History:  Procedure Laterality Date   BASAL CELL CARCINOMA EXCISION  11/01/2022   CATARACT EXTRACTION W/PHACO Left 10/09/2021   Procedure: CATARACT EXTRACTION PHACO AND INTRAOCULAR LENS PLACEMENT (IOC) LEFT DIABETIC 4.84 00:37.8;  Surgeon: Jaye Fallow, MD;  Location: [facility];  Service: Ophthalmology;  Laterality: Left;  Diabetic   CATARACT EXTRACTION W/PHACO Right 10/30/2021   Procedure: CATARACT EXTRACTION PHACO AND INTRAOCULAR LENS PLACEMENT (IOC) RIGHT DIABETIC 5.65 00:38.5;  Surgeon: Jaye Fallow, MD;  Location: [facility];  Service: Ophthalmology;  Laterality: Right;  Diabetic   CHOLECYSTECTOMY     DILATION AND CURETTAGE OF UTERUS     IR FLUORO GUIDE CV LINE RIGHT  08/16/2021   IR THORACENTESIS ASP PLEURAL SPACE W/IMG GUIDE  08/16/2021   PERICARDIOCENTESIS N/A 08/05/2021   Procedure: PERICARDIOCENTESIS;  Surgeon: Ammon Blunt, MD;  Location: [facility] INVASIVE CV LAB;  Service: Cardiovascular;  Laterality: N/A;   RIGHT AND LEFT HEART CATH N/A 03/29/2024   Procedure: RIGHT AND LEFT HEART CATH;  Surgeon: Zenaida Morene PARAS, MD;  Location: [facility] INVASIVE CV  LAB;  Service: Cardiovascular;  Laterality: N/A;   RIGHT HEART CATH N/A 03/22/2024   Procedure: RIGHT HEART CATH;  Surgeon: Darron Deatrice LABOR, MD;  Location: [facility] INVASIVE CV LAB;  Service: Cardiovascular;  Laterality: N/A;   TEAR DUCT PROBING WITH STRABISMUS REPAIR Right    TONSILLECTOMY       Home Medications:  Prior to Admission medications   Medication Sig Start Date End Date Taking? Authorizing Provider  allopurinol  (ZYLOPRIM ) 100 MG tablet Take 1 tablet (100 mg total) by mouth daily. 12/19/23  Yes Johnson, [name], DO  aspirin  EC 81 MG tablet Take 1 tablet (81 mg total) by mouth daily. Swallow whole. 07/01/24  Yes Dorinda Drue DASEN, MD  empagliflozin  (JARDIANCE ) 25 MG TABS tablet Take 1 tablet (25 mg total) by mouth daily. 07/01/24  Yes Dorinda Drue DASEN, MD  metFORMIN  (GLUCOPHAGE ) 1000  MG tablet Take 1,000 mg by mouth daily. 08/06/24  Yes [provider]  metolazone  (ZAROXOLYN ) 5 MG tablet Take 5 mg by mouth daily.   Yes [provider]  naloxone (NARCAN) nasal spray 4 mg/0.1 mL Place 0.4 mg into the nose once as needed (SINGNS, SYMPTOMS OF OPIOIDS).   Yes [provider]  pantoprazole  (PROTONIX ) 40 MG tablet Take 1 tablet (40 mg total) by mouth 2 (two) times daily. 12/19/23  Yes Johnson, [name], DO  potassium chloride  SA (KLOR-CON  M) 20 MEQ tablet Take 40 mEq by mouth daily.   Yes [provider]  spironolactone  (ALDACTONE ) 25 MG tablet Take 12.5 mg by mouth daily. 08/09/24  Yes [provider]  torsemide  (DEMADEX ) 20 MG tablet 2 tablets by mouth daily Patient taking differently: Take 60 mg by mouth daily. 2 tablets by mouth daily 05/24/24  Yes Clegg, Amy D, NP  acetaminophen  (TYLENOL ) 325 MG tablet Take 2 tablets (650 mg total) by mouth every 6 (six) hours as needed for mild pain (pain score 1-3) or fever (or Fever >/= 101). 06/30/24   Dorinda Drue DASEN, MD  bisacodyl  (DULCOLAX) 5 MG EC tablet Take 1 tablet (5 mg total) by mouth daily as needed for moderate constipation. Patient not taking: Reported on 08/20/2024 06/30/24   Dorinda Drue DASEN, MD  LANTUS  SOLOSTAR 100 UNIT/ML Solostar Pen Inject into the skin. Patient not taking: Reported on 08/20/2024 08/05/24   [provider]  latanoprost  (XALATAN ) 0.005 % ophthalmic solution Place 1 drop into both eyes at bedtime.    [provider]  midodrine  (PROAMATINE ) 10 MG tablet Take 10 mg by mouth 3 (three) times daily. Take 1 tablet (10 mg total) by mouth in the morning and 1 tablet (10 mg total) in the evening and 1 tablet (10 mg total) before bedtime. Patient not taking: Reported on 08/20/2024 06/17/24 09/15/24  [provider]  oxyCODONE  (OXY IR/ROXICODONE ) 5 MG immediate release tablet Take 1 tablet (5 mg total) by mouth every 6 (six) hours as needed for severe pain (pain  score 7-10) or breakthrough pain. 06/30/24   Dorinda Drue DASEN, MD  polyethylene glycol (MIRALAX  / GLYCOLAX ) 17 g packet Take 17 g by mouth 2 (two) times daily. 06/30/24   Dorinda Drue DASEN, MD  spironolactone  (ALDACTONE ) 50 MG tablet Take 1 tablet (50 mg total) by mouth daily. Patient not taking: Reported on 08/19/2024 12/19/23   Vicci Bouchard P, DO  traMADol  (ULTRAM ) 50 MG tablet Take 50 mg by mouth daily as needed. Patient not taking: Reported on 08/20/2024 04/15/24   [provider]    Scheduled Meds:  allopurinol   100 mg Oral Daily   aspirin  EC  81 mg Oral Daily   empagliflozin   25 mg Oral Daily   furosemide   40 mg Intravenous BID   heparin   5,000 Units Subcutaneous Q12H   insulin  aspart  0-9 Units Subcutaneous TID WC   latanoprost   1 drop Both Eyes QHS   midodrine   5 mg Oral TID WC   pantoprazole   40 mg Oral BID   polyethylene glycol  17 g Oral BID   sodium chloride  flush  3 mL Intravenous Q12H   spironolactone   12.5 mg Oral Daily   Continuous Infusions:  sodium chloride      PRN Meds: sodium chloride , acetaminophen , bisacodyl , metoprolol  tartrate, ondansetron  (ZOFRAN ) IV, oxyCODONE , sodium chloride  flush, temazepam  Allergies:   No Known Allergies  Social History:   Social History   Socioeconomic History   Marital status: Married    Spouse name: Not on file   Number of children: Not on file   Years of education: Not on file   Highest education level: Not on file  Occupational History   Not on file  Tobacco Use   Smoking status: Never   Smokeless tobacco: Never  Vaping Use   Vaping status: Never Used  Substance and Sexual Activity   Alcohol use: Not Currently    Alcohol/week: 0.0 standard drinks of alcohol    Comment: on rare occasion   Drug use: No   Sexual activity: Not Currently  Other Topics Concern   Not on file  Social History Narrative   Not on file   Social Drivers of Health   Financial Resource Strain: Low Risk  (07/09/2024)   Received from  [facility]   Overall Financial Resource Strain (CARDIA)    Difficulty of Paying Living Expenses: Not hard at all  Food Insecurity: No Food Insecurity (08/19/2024)   Hunger Vital Sign    Worried About Radiation Protection Practitioner of Food in the Last Year: Never true    Ran Out of Food in the Last Year: Never true  Transportation Needs: No Transportation Needs (08/19/2024)   PRAPARE - Administrator, Civil Service (Medical): No    Lack of Transportation (Non-Medical): No  Physical Activity: Sufficiently Active (07/23/2022)   Exercise Vital Sign    Days of Exercise per Week: 3 days    Minutes of Exercise per Session: 60 min  Stress: No Stress Concern Present (07/23/2022)   [employer] of Occupational Health - Occupational Stress Questionnaire    Feeling of Stress : Not at all  Social Connections: Unknown (08/19/2024)   Social Connection and Isolation Panel    Frequency of Communication with Friends and Family: Three times a week    Frequency of Social Gatherings with Friends and Family: Twice a week    Attends Religious Services: Not on Marketing Executive or Organizations: No    Attends Engineer, Structural: Not on file    Marital Status: Married  Recent Concern: Social Connections - Moderately Isolated (06/25/2024)   Social Connection and Isolation Panel    Frequency of Communication with Friends and Family: Twice a week    Frequency of Social Gatherings with Friends and Family: Once a week    Attends Religious Services: Never    Database Administrator or Organizations: No    Attends Banker Meetings: Never    Marital Status: Married  Catering Manager Violence: Unknown (08/19/2024)   Humiliation,  Afraid, Rape, and Kick questionnaire    Fear of Current or Ex-Partner: No    Emotionally Abused: No    Physically Abused: Not on file    Sexually Abused: No    Family History:    Family History  Problem Relation Age of Onset   Heart  disease Mother        CHF   Osteoporosis Mother    Breast cancer Mother    Heart disease Father 33   Cancer Brother        Colon CA- 2002- Youngest brother   Parkinson's disease Brother        Younger Brother   Heart disease Brother        2 brothers   Diabetes Brother    Cancer Brother    Breast cancer Maternal Grandmother      ROS:  Please see the history of present illness.  Review of Systems  Constitutional:  Positive for malaise/fatigue.  Respiratory:  Positive for shortness of breath.   Cardiovascular:  Positive for leg swelling.  Neurological:  Positive for weakness.    All other ROS reviewed and negative.     Physical Exam/Data: Vitals:   08/20/24 0307 08/20/24 0500 08/20/24 0743 08/20/24 1141  BP: 102/67  94/78 (!) 83/64  Pulse: 95  89 84  Resp: 16  14 14   Temp: 97.9 F (36.6 C)  98.9 F (37.2 C) 97.8 F (36.6 C)  TempSrc:      SpO2: 98%  98% 97%  Weight:  63.6 kg    Height:  5' (1.524 m)      Intake/Output Summary (Last 24 hours) at 08/20/2024 1157 Last data filed at 08/20/2024 0900 Gross per 24 hour  Intake 240 ml  Output 400 ml  Net -160 ml      08/20/2024    5:00 AM 06/25/2024    3:30 AM 06/23/2024   12:11 PM  Last 3 Weights  Weight (lbs) 140 lb 3.4 oz 151 lb 143 lb  Weight (kg) 63.6 kg 68.493 kg 64.864 kg     Body mass index is 27.38 kg/m.  General: Frail chronically ill-appearing in no acute distress HEENT: normal Neck: Unable to determine JVD due to body habitus Vascular: No carotid bruits; Distal pulses 2+ bilaterally Cardiac:  normal S1, S2; IR IR; II/VI systolic murmur RUSB without rubs or gallops  Lungs:  clear upper lobes with diminished bases with scattered rhonchi to auscultation bilaterally, respirations are unlabored at rest on room air  Abd: soft, nontender, no hepatomegaly  Ext: 2-3+ edema BLE Musculoskeletal:  No deformities, BUE and BLE strength normal and equal Skin: warm and dry  Neuro:  CNs 2-12 intact, no focal  abnormalities noted Psych:  Normal affect   EKG:  The EKG was personally reviewed and demonstrates: Rate controlled atrial fibrillation with a rate of 71 with an old anterior infarct Telemetry:  Telemetry was personally reviewed and demonstrates: Rate controlled atrial fibrillation  Relevant CV Studies:  2d echo 03/20/2024 1. Intracavitary gradient. Peak velocity 2.31 m/s. Peak gradient 21.3  mmHg. Left ventricular ejection fraction, by estimation, is 60 to 65%. The  left ventricle has normal function. The left ventricle has no regional  wall motion abnormalities. There is  moderate concentric left ventricular hypertrophy.   2. Right ventricular systolic function is normal. The right ventricular  size is normal. There is normal pulmonary artery systolic pressure.   3. Mostly lateral to the RV. There is also echogenic material in the  pericardium lateral to the RV apex. This is visualized on prior echo as  well. May represent fat pad. However, it may also represent evidence of  prior hemopericardium. a small  pericardial effusion is present. There is no evidence of cardiac  tamponade.   4. Mobile MAC on the posterior mitral valve annulus. The mitral valve is  normal in structure. Mild mitral valve regurgitation. Mild mitral  stenosis. The mean mitral valve gradient is 3.5 mmHg with average heart  rate of 79 bpm. Severe mitral annular  calcification.   5. The aortic valve is normal in structure. There is severe calcifcation  of the aortic valve. There is severe thickening of the aortic valve.  Aortic valve regurgitation is not visualized. Mild aortic valve stenosis.  Aortic valve area, by VTI measures  1.10 cm. Aortic valve mean gradient measures 13.8 mmHg. Aortic valve Vmax  measures 2.57 m/s.   6. The inferior vena cava is normal in size with greater than 50%  respiratory variability, suggesting right atrial pressure of 3 mmHg.   Echo limited 10/2021 1. Left ventricular ejection  fraction, by estimation, is 60 to 65%. The  left ventricle has normal function. The left ventricle has no regional  wall motion abnormalities. There is mild left ventricular hypertrophy.  Left ventricular diastolic parameters  are indeterminate.   2. Right ventricular systolic function is normal. The right ventricular  size is normal.   3. A small to moderate sized pericardial effusion is present, 1.1 Cm  circumferential, pocket of 2.5 cm off the LV free wall. No tamponade  present.   4. The mitral valve is normal in structure. Moderate mitral valve  regurgitation. No evidence of mitral stenosis. Moderate mitral annular  calcification.   5. Aortic valve has moderate calcification of the aortic valve. Aortic  valve regurgitation is not visualized.Degree of stenosis not measured.   6. The inferior vena cava is normal in size with greater than 50%  respiratory variability, suggesting right atrial pressure of 3 mmHg.    Comparison(s): Moderate pericardial effusion. Largest 1.9 cm LV free wall,  Moderate MR.    Limited Echo 08/2021  1. Left pleural effusion noted, 5 cm   2. Moderate pericardial effusion, estimated 1.9 off the LV free wall,  minimal off the RV free wall (0.4 cm) . There is no evidence of cardiac  tamponade.   3. Left ventricular ejection fraction, by estimation, is 60 to 65%. The  left ventricle has normal function. The left ventricle has no regional  wall motion abnormalities. There is mild left ventricular hypertrophy.   4. Right ventricular systolic function is normal. The right ventricular  size is normal.   5. Left atrial size was severely dilated.   6. The mitral valve is normal in structure. Moderate mitral valve  regurgitation. No evidence of mitral stenosis.   7. The aortic valve is normal in structure. Aortic valve regurgitation is  not visualized. Mild to moderate aortic valve sclerosis/calcification is  present, without any evidence of aortic stenosis.    Laboratory Data: High Sensitivity Troponin:  No results for input(s): TROPONINIHS in the last 720 hours.   Chemistry Recent Labs  Lab 08/19/24 1212 08/19/24 2015 08/20/24 0328  NA 134*  --  134*  K 2.6* 3.1* 3.3*  CL 88*  --  91*  CO2 28  --  26  GLUCOSE 138*  --  111*  BUN 72*  --  74*  CREATININE 2.56*  --  2.47*  CALCIUM   9.4  --  9.1  MG 2.0  --   --   GFRNONAA 19*  --  19*  ANIONGAP 18*  --  17*    Recent Labs  Lab 08/19/24 1212  PROT 7.0  ALBUMIN  3.3*  AST 29  ALT 10  ALKPHOS 137*  BILITOT 2.1*   Lipids No results for input(s): CHOL, TRIG, HDL, LABVLDL, LDLCALC, CHOLHDL in the last 168 hours.  Hematology Recent Labs  Lab 08/19/24 1212  WBC 6.3  RBC 5.90*  HGB 14.3  HCT 45.3  MCV 76.8*  MCH 24.2*  MCHC 31.6  RDW 22.9*  PLT 293   Thyroid  No results for input(s): TSH, FREET4 in the last 168 hours.  BNP Recent Labs  Lab 08/19/24 1212  BNP 242.5*    DDimer No results for input(s): DDIMER in the last 168 hours.  Radiology/Studies:  US  Venous Img Lower Bilateral (DVT) Result Date: 08/20/2024 CLINICAL DATA:  Lower extremity edema. EXAM: BILATERAL LOWER EXTREMITY VENOUS DOPPLER ULTRASOUND TECHNIQUE: Gray-scale sonography with graded compression, as well as color Doppler and duplex ultrasound were performed to evaluate the lower extremity deep venous systems from the level of the common femoral vein and including the common femoral, femoral, profunda femoral, popliteal and calf veins including the posterior tibial, peroneal and gastrocnemius veins when visible. The superficial great saphenous vein was also interrogated. Spectral Doppler was utilized to evaluate flow at rest and with distal augmentation maneuvers in the common femoral, femoral and popliteal veins. COMPARISON:  None Available. FINDINGS: RIGHT LOWER EXTREMITY Common Femoral Vein: No evidence of thrombus. Normal compressibility, respiratory phasicity and response to  augmentation. Saphenofemoral Junction: No evidence of thrombus. Normal compressibility and flow on color Doppler imaging. Profunda Femoral Vein: No evidence of thrombus. Normal compressibility and flow on color Doppler imaging. Femoral Vein: Flow present throughout the right femoral vein. There is lack of complete compressibility of the distal right femoral vein with suggestion of some posterior mural thrombus. 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 Popliteal Vein: No evidence of thrombus. Normal compressibility, respiratory phasicity and response to augmentation. Calf Veins: No evidence of thrombus. Normal compressibility and flow on color Doppler imaging. Superficial Great Saphenous Vein: No evidence of thrombus. Normal compressibility. Venous Reflux:  None. Other Findings: No evidence of superficial thrombophlebitis or abnormal fluid collection. LEFT LOWER EXTREMITY Common Femoral Vein: No evidence of thrombus. Normal compressibility, respiratory phasicity and response to augmentation. Saphenofemoral Junction: No evidence of thrombus. Normal compressibility and flow on color Doppler imaging. Profunda Femoral Vein: No evidence of thrombus. Normal compressibility and flow on color Doppler imaging. Femoral Vein: No evidence of thrombus. Normal compressibility, respiratory phasicity and response to augmentation. Popliteal Vein: No evidence of thrombus. Normal compressibility, respiratory phasicity and response to augmentation. Calf Veins: Left calf veins not adequately visualized for evaluation. Superficial Great Saphenous Vein: No evidence of thrombus. Normal compressibility. Venous Reflux:  None. Other Findings: No evidence of superficial thrombophlebitis or abnormal fluid collection. IMPRESSION: 1. No evidence of acute deep venous thrombosis in either lower extremity. 2. Lack of complete compressibility of the distal right femoral vein with suggestion of some posterior mural  thrombus. 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 3. Left calf veins not adequately visualized for evaluation. Electronically Signed   By: Marcey Moan M.D.   On: 08/20/2024 08:21   DG Chest 2 View Result Date: 08/19/2024 EXAM: 2 VIEW(S) XRAY OF THE CHEST 08/19/2024 02:32:00 PM COMPARISON: 03/22/2024  CLINICAL HISTORY: Evaluate for pleural edeam FINDINGS: LINES, TUBES AND DEVICES: Numerous leads and wires over chest. LUNGS AND PLEURA: Bibasilar airspace disease. Small bilateral pleural effusions. Mild interstitial edema suspected. No pneumothorax. HEART AND MEDIASTINUM: Mild cardiomegaly. Atherosclerosis in transverse aorta. BONES AND SOFT TISSUES: No acute osseous abnormality. IMPRESSION: 1. Small bilateral pleural effusions and associated basilar airspace opacities. Favor edema. Atelectasis or infection could appear similarly. 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 -Presented with worsening swelling into the bilateral lower extremities and upper extremities -Marked volume overload -BNP 242.5 -Difficult to determine volume status due to body habitus -LVEF 60-65% with moderate to severe AAS, normal RV on echo read -Previous hospitalization required pressors and inotropes -Right heart catheterization 03/22/2024 with RA 18, PA 38/22, PCW 26, CO/CI 3.06/1.85, PA PI 0.8 with evidence of biventricular heart failure -Limited options with renal dysfunction and poor mobility -NYHA class III symptoms -Continued on spironolactone  12.5 mg daily -Continued furosemide  40 mg IV twice daily -Continue Jardiance  25 mg daily - Would recommend continuing with hospice support -Continue with heart failure follow-up  Hypotension -Blood pressure systolic less than 100 -Continue midodrine  5 mg 3 times daily -Vital signs per unit  protocol  Permanent atrial fibrillation -Continues to be rate controlled - Continue Toprol  XL -No longer on apixaban  due to iron  deficiency anemia -Continued on aspirin  81 mg daily -Continue on telemetry monitoring  CKD stage IV -Serum creatinine 2.47 -Baseline serum creatinine 2.5-2.6 -Monitor urine output -Monitor/trend/replete electrolytes as needed -Followed by nephrology  Hypokalemia -Serum potassium 2.6 on arrival - Potassium repleted - Repeat 3.3 - Supplementations given - Recommend keep potassium greater than 4 less than 5 - Daily BMP  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 - Has been stable on studies but suspect somewhat contributing to her symptoms  Chronic lymphedema -With 2-3+ edema to the bilateral lower extremities -bed bound for several weeks since left ankle fracture  Sacral decubiti stage II, POA -Complains of discomfort to her backside -Has been bedbound for 4+ weeks since ankle fracture -Previously had been on 2 weeks worth of IV antibiotics -Outpatient wound care center follow-up -ongoing management per IM  Diabetes - Continued on sliding scale insulin  - Ongoing management per IM  Small pericardial effusion - History of pericardial effusion status post pericardiocentesis with removal of 700 mL 07/2021 bloody but was negative for malignancy -Noted to be small without tamponade on echocardiogram -MRI was stable -Limited echo ordered to reassess to ensure pericardial effusion has remained stable   Risk Assessment/Risk Scores:       [place]  Heart Association (NYHA) Functional Class NYHA Class III  CHA2DS2-VASc Score = 5   This indicates a 7.2% annual risk of stroke. The patient's score is based upon: CHF History: 1 HTN History: 0 Diabetes History: 1 Stroke History: 0 Vascular Disease History: 0 Age Score: 2 Gender Score: 1        For questions or updates, please contact [facility] Please consult [email or web address] for contact  info under      Signed, Noemie Devivo, NP  08/20/2024 11:57 AM

## 2024-08-21 ENCOUNTER — Inpatient Hospital Stay (HOSPITAL_COMMUNITY)
Admit: 2024-08-21 | Discharge: 2024-08-21 | Disposition: A | Discharge status: Home / Self Care | Attending: Cardiology | Admitting: Cardiology

## 2024-08-21 DIAGNOSIS — I4821 Permanent atrial fibrillation: Secondary | ICD-10-CM | POA: Diagnosis not present

## 2024-08-21 DIAGNOSIS — I5082 Biventricular heart failure: Secondary | ICD-10-CM

## 2024-08-21 DIAGNOSIS — I425 Other restrictive cardiomyopathy: Secondary | ICD-10-CM

## 2024-08-21 DIAGNOSIS — I3139 Other pericardial effusion (noninflammatory): Secondary | ICD-10-CM | POA: Diagnosis not present

## 2024-08-21 LAB — GLUCOSE, CAPILLARY
Glucose-Capillary: 108 mg/dL — ABNORMAL HIGH (ref 70–99)
Glucose-Capillary: 156 mg/dL — ABNORMAL HIGH (ref 70–99)
Glucose-Capillary: 185 mg/dL — ABNORMAL HIGH (ref 70–99)
Glucose-Capillary: 162 mg/dL — ABNORMAL HIGH (ref 70–99)

## 2024-08-21 LAB — ECHOCARDIOGRAM LIMITED
AV Mean grad: 13.5 mmHg
AV Peak grad: 26.1 mmHg
Ao pk vel: 2.56 m/s
Area-P 1/2: 3.89 cm2
Height: 60 in
S' Lateral: 2.3 cm
Weight: 2243.4 [oz_av]

## 2024-08-21 LAB — BASIC METABOLIC PANEL WITH GFR
Anion gap: 20 — ABNORMAL HIGH (ref 5–15)
BUN: 76 mg/dL — ABNORMAL HIGH (ref 8–23)
CO2: 24 mmol/L (ref 22–32)
Calcium: 9.2 mg/dL (ref 8.9–10.3)
Chloride: 86 mmol/L — ABNORMAL LOW (ref 98–111)
Creatinine, Ser: 2.72 mg/dL — ABNORMAL HIGH (ref 0.44–1.00)
GFR, Estimated: 17 mL/min — ABNORMAL LOW (ref >60)
Glucose, Bld: 121 mg/dL — ABNORMAL HIGH (ref 70–99)
Potassium: 2.9 mmol/L — ABNORMAL LOW (ref 3.5–5.1)
Sodium: 130 mmol/L — ABNORMAL LOW (ref 135–145)

## 2024-08-21 LAB — MAGNESIUM: Magnesium: 2.1 mg/dL (ref 1.7–2.4)

## 2024-08-21 MED ORDER — POTASSIUM CHLORIDE 20 MEQ PO PACK
40.0000 meq | PACK | Freq: Once | ORAL | Status: AC
  Administered 2024-08-21: 40 meq via ORAL
  Filled 2024-08-21: qty 2

## 2024-08-21 MED ORDER — TORSEMIDE 20 MG PO TABS
60.0000 mg | ORAL_TABLET | Freq: Every day | ORAL | Status: DC
  Administered 2024-08-21 – 2024-08-23 (×3): 60 mg via ORAL
  Filled 2024-08-21 (×3): qty 3

## 2024-08-21 MED ORDER — POTASSIUM CHLORIDE 20 MEQ PO PACK
40.0000 meq | PACK | Freq: Two times a day (BID) | ORAL | Status: AC
  Administered 2024-08-21 (×2): 40 meq via ORAL
  Filled 2024-08-21 (×2): qty 2

## 2024-08-21 NOTE — Progress Notes (Signed)
 Rounding Note   Patient Name: Karen Farley Date of Encounter: 08/21/2024  [facility] Cardiologist: Evalene Lunger, MD   Subjective Patient seen on a.m. rounds.  Denies any chest pain or shortness of breath.  Swelling has improved.  As per remains at bedside.  -340 mL output in the last 24 hours.  Scheduled Meds:  allopurinol   100 mg Oral Daily   aspirin  EC  81 mg Oral Daily   empagliflozin   25 mg Oral Daily   heparin   5,000 Units Subcutaneous Q12H   insulin  aspart  0-9 Units Subcutaneous TID WC   latanoprost   1 drop Both Eyes QHS   midodrine   5 mg Oral TID WC   pantoprazole   40 mg Oral BID   polyethylene glycol  17 g Oral BID   potassium chloride   40 mEq Oral BID   sodium chloride  flush  3 mL Intravenous Q12H   spironolactone   12.5 mg Oral Daily   torsemide   60 mg Oral Daily   Continuous Infusions:  PRN Meds: acetaminophen , bisacodyl , metoprolol  tartrate, ondansetron  (ZOFRAN ) IV, oxyCODONE , sodium chloride  flush, temazepam   Vital Signs  Vitals:   08/21/24 0010 08/21/24 0325 08/21/24 0500 08/21/24 0751  BP: 94/68 94/74  113/77  Pulse: 91 87  88  Resp: 15 14  16   Temp: (!) 97.5 F (36.4 C) (!) 97.5 F (36.4 C)  (!) 97.5 F (36.4 C)  TempSrc:      SpO2: 92% 97%  99%  Weight:   63.6 kg   Height:        Intake/Output Summary (Last 24 hours) at 08/21/2024 1030 Last data filed at 08/21/2024 1029 Gross per 24 hour  Intake 360 ml  Output 700 ml  Net -340 ml      08/21/2024    5:00 AM 08/20/2024    5:00 AM 06/25/2024    3:30 AM  Last 3 Weights  Weight (lbs) 140 lb 3.4 oz 140 lb 3.4 oz 151 lb  Weight (kg) 63.6 kg 63.6 kg 68.493 kg      Telemetry Rate controlled atrial fibrillation- Personally Reviewed  ECG  No new tracings- Personally Reviewed  Physical Exam  GEN: No acute distress.   Neck: No JVD Cardiac: IR IR, II/VI systolic murmur RUSB, without rubs or gallops.  Respiratory: Clear with diminished bases to auscultation bilaterally.   Respirations are unlabored at rest on room air GI: Soft, nontender, non-distended  MS: 1+ edema right lower extremity, left lower extremity is in a walking boot; No deformity. Neuro:  Nonfocal  Psych: Normal affect   Labs High Sensitivity Troponin:  No results for input(s): TROPONINIHS in the last 720 hours.   Chemistry Recent Labs  Lab 08/19/24 1212 08/19/24 2015 08/20/24 0328 08/21/24 0443  NA 134*  --  134* 130*  K 2.6* 3.1* 3.3* 2.9*  CL 88*  --  91* 86*  CO2 28  --  26 24  GLUCOSE 138*  --  111* 121*  BUN 72*  --  74* 76*  CREATININE 2.56*  --  2.47* 2.72*  CALCIUM  9.4  --  9.1 9.2  MG 2.0  --   --  2.1  PROT 7.0  --   --   --   ALBUMIN  3.3*  --   --   --   AST 29  --   --   --   ALT 10  --   --   --   ALKPHOS 137*  --   --   --  BILITOT 2.1*  --   --   --   GFRNONAA 19*  --  19* 17*  ANIONGAP 18*  --  17* 20*    Lipids No results for input(s): CHOL, TRIG, HDL, LABVLDL, LDLCALC, CHOLHDL in the last 168 hours.  Hematology Recent Labs  Lab 08/19/24 1212  WBC 6.3  RBC 5.90*  HGB 14.3  HCT 45.3  MCV 76.8*  MCH 24.2*  MCHC 31.6  RDW 22.9*  PLT 293   Thyroid  No results for input(s): TSH, FREET4 in the last 168 hours.  BNP Recent Labs  Lab 08/19/24 1212  BNP 242.5*    DDimer No results for input(s): DDIMER in the last 168 hours.   Radiology  US  Venous Img Lower Bilateral (DVT) Result Date: 08/20/2024 CLINICAL DATA:  Lower extremity edema. EXAM: BILATERAL LOWER EXTREMITY VENOUS DOPPLER ULTRASOUND TECHNIQUE: Gray-scale sonography with graded compression, as well as color Doppler and duplex ultrasound were performed to evaluate the lower extremity deep venous systems from the level of the common femoral vein and including the common femoral, femoral, profunda femoral, popliteal and calf veins including the posterior tibial, peroneal and gastrocnemius veins when visible. The superficial great saphenous vein was also interrogated. Spectral  Doppler was utilized to evaluate flow at rest and with distal augmentation maneuvers in the common femoral, femoral and popliteal veins. COMPARISON:  None Available. FINDINGS: RIGHT LOWER EXTREMITY Common Femoral Vein: No evidence of thrombus. Normal compressibility, respiratory phasicity and response to augmentation. Saphenofemoral Junction: No evidence of thrombus. Normal compressibility and flow on color Doppler imaging. Profunda Femoral Vein: No evidence of thrombus. Normal compressibility and flow on color Doppler imaging. Femoral Vein: Flow present throughout the right femoral vein. There is lack of complete compressibility of the distal right femoral vein with suggestion of some posterior mural thrombus. 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 Popliteal Vein: No evidence of thrombus. Normal compressibility, respiratory phasicity and response to augmentation. Calf Veins: No evidence of thrombus. Normal compressibility and flow on color Doppler imaging. Superficial Great Saphenous Vein: No evidence of thrombus. Normal compressibility. Venous Reflux:  None. Other Findings: No evidence of superficial thrombophlebitis or abnormal fluid collection. LEFT LOWER EXTREMITY Common Femoral Vein: No evidence of thrombus. Normal compressibility, respiratory phasicity and response to augmentation. Saphenofemoral Junction: No evidence of thrombus. Normal compressibility and flow on color Doppler imaging. Profunda Femoral Vein: No evidence of thrombus. Normal compressibility and flow on color Doppler imaging. Femoral Vein: No evidence of thrombus. Normal compressibility, respiratory phasicity and response to augmentation. Popliteal Vein: No evidence of thrombus. Normal compressibility, respiratory phasicity and response to augmentation. Calf Veins: Left calf veins not adequately visualized for evaluation. Superficial Great Saphenous Vein: No evidence of thrombus. Normal compressibility. Venous  Reflux:  None. Other Findings: No evidence of superficial thrombophlebitis or abnormal fluid collection. IMPRESSION: 1. No evidence of acute deep venous thrombosis in either lower extremity. 2. Lack of complete compressibility of the distal right femoral vein with suggestion of some posterior mural thrombus. 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 3. Left calf veins not adequately visualized for evaluation. Electronically Signed   By: Marcey Moan M.D.   On: 08/20/2024 08:21   DG Chest 2 View Result Date: 08/19/2024 EXAM: 2 VIEW(S) XRAY OF THE CHEST 08/19/2024 02:32:00 PM COMPARISON: 03/22/2024 CLINICAL HISTORY: Evaluate for pleural edeam FINDINGS: LINES, TUBES AND DEVICES: Numerous leads and wires over chest. LUNGS AND PLEURA: Bibasilar airspace disease. Small bilateral pleural  effusions. Mild interstitial edema suspected. No pneumothorax. HEART AND MEDIASTINUM: Mild cardiomegaly. Atherosclerosis in transverse aorta. BONES AND SOFT TISSUES: No acute osseous abnormality. IMPRESSION: 1. Small bilateral pleural effusions and associated basilar airspace opacities. Favor edema. Atelectasis or infection could appear similarly. Electronically signed by: Norman Gatlin MD 08/19/2024 02:39 PM EDT RP Workstation: [id number]    Cardiac Studies 2d echo 03/20/2024 1. Intracavitary gradient. Peak velocity 2.31 m/s. Peak gradient 21.3  mmHg. Left ventricular ejection fraction, by estimation, is 60 to 65%. The  left ventricle has normal function. The left ventricle has no regional  wall motion abnormalities. There is  moderate concentric left ventricular hypertrophy.   2. Right ventricular systolic function is normal. The right ventricular  size is normal. There is normal pulmonary artery systolic pressure.   3. Mostly lateral to the RV. There is also echogenic material in the  pericardium lateral to the RV apex. This is visualized on prior echo as  well. May represent fat pad.  However, it may also represent evidence of  prior hemopericardium. a small  pericardial effusion is present. There is no evidence of cardiac  tamponade.   4. Mobile MAC on the posterior mitral valve annulus. The mitral valve is  normal in structure. Mild mitral valve regurgitation. Mild mitral  stenosis. The mean mitral valve gradient is 3.5 mmHg with average heart  rate of 79 bpm. Severe mitral annular  calcification.   5. The aortic valve is normal in structure. There is severe calcifcation  of the aortic valve. There is severe thickening of the aortic valve.  Aortic valve regurgitation is not visualized. Mild aortic valve stenosis.  Aortic valve area, by VTI measures  1.10 cm. Aortic valve mean gradient measures 13.8 mmHg. Aortic valve Vmax  measures 2.57 m/s.   6. The inferior vena cava is normal in size with greater than 50%  respiratory variability, suggesting right atrial pressure of 3 mmHg.    Echo limited 10/2021 1. Left ventricular ejection fraction, by estimation, is 60 to 65%. The  left ventricle has normal function. The left ventricle has no regional  wall motion abnormalities. There is mild left ventricular hypertrophy.  Left ventricular diastolic parameters  are indeterminate.   2. Right ventricular systolic function is normal. The right ventricular  size is normal.   3. A small to moderate sized pericardial effusion is present, 1.1 Cm  circumferential, pocket of 2.5 cm off the LV free wall. No tamponade  present.   4. The mitral valve is normal in structure. Moderate mitral valve  regurgitation. No evidence of mitral stenosis. Moderate mitral annular  calcification.   5. Aortic valve has moderate calcification of the aortic valve. Aortic  valve regurgitation is not visualized.Degree of stenosis not measured.   6. The inferior vena cava is normal in size with greater than 50%  respiratory variability, suggesting right atrial pressure of 3 mmHg.    Comparison(s):  Moderate pericardial effusion. Largest 1.9 cm LV free wall,  Moderate MR.    Limited Echo 08/2021  1. Left pleural effusion noted, 5 cm   2. Moderate pericardial effusion, estimated 1.9 off the LV free wall,  minimal off the RV free wall (0.4 cm) . There is no evidence of cardiac  tamponade.   3. Left ventricular ejection fraction, by estimation, is 60 to 65%. The  left ventricle has normal function. The left ventricle has no regional  wall motion abnormalities. There is mild left ventricular hypertrophy.   4. Right ventricular systolic  function is normal. The right ventricular  size is normal.   5. Left atrial size was severely dilated.   6. The mitral valve is normal in structure. Moderate mitral valve  regurgitation. No evidence of mitral stenosis.   7. The aortic valve is normal in structure. Aortic valve regurgitation is  not visualized. Mild to moderate aortic valve sclerosis/calcification is  present, without any evidence of aortic stenosis.   Patient Profile   78 y.o. 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 -Presented with worsening swelling to bilateral lower extremities and upper extremities -Difficult to determine volume status due to body habitus -BNP 242.5 -LVEF 60 to 65% with moderate to severe AAS, normal RV on echo read -NYHA class III symptoms -Continued on spironolactone  12.5 mg daily -Transition IV furosemide  back to torsemide  today -Continued on Jardiance  25 mg daily -Continue with outpatient heart failure follow-up -Heart failure education -Daily weights and I's and O's  Hypotension -Blood pressure  113/77 -Continue to monitor and 3 times daily -Vital signs per unit protocol  Permanent atrial fibrillation -Continues to remain rate controlled -Continued on Toprol -XL -No longer on apixaban  due to iron  deficiency anemia -Continue aspirin  81 mg daily -Continue with telemetry monitoring  CKD stage IV -Serum creatinine 2.72 -Baseline serum creatinine 2.5-2.6 -Monitor urine output -Monitor/trend/replete electrolytes as needed -Followed by nephrology  Hypokalemia -Serum potassium this morning 2.9 -Supplementations given -Recommend keeping potassium greater than 4 less than 5 -Daily BMP  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 -Has been stable on studies but suspect somewhat contributing to her symptoms  Chronic lymphedema -Edema has improved with elevation - Change IV back to oral diuretic therapy  Sacral decubitus stage II, POA -Previously been on 2 weeks worth of IV antibiotics -Outpatient wound center follow-up -Ongoing management per IM  Diabetes -Continue on sliding scale insulin  -Ongoing management per IM  Small pericardial effusion -History of pericardial effusion status post pericarditis with removal of 7 mL in 07/2021 bloody but was negative for malignancy -Noted on repeat echocardiogram small without tamponade on -Cardiac MRI with stable    For questions or updates, please contact [facility] Please consult [email or web address] for contact info under       Signed, Aracelys Glade, NP  08/21/2024, 10:30 AM

## 2024-08-21 NOTE — Progress Notes (Signed)
  Echocardiogram 2D Echocardiogram has been performed. A Limited Echo was requested and completed.  Karen Farley Louder 08/21/2024, 10:06 AM

## 2024-08-21 NOTE — Evaluation (Signed)
 Physical Therapy Evaluation Patient Details Name: Karen Farley MRN: [id number] DOB: 1946/04/30 Today's Date: 08/21/2024  History of Present Illness  Pt is a 78 y/o F admitted on 08/19/24 after presenting from nursing home for evaluation of severe hypokalemia. Pt with recent admission 7 weeks ago for L ankle fx, non operable, placed in cam boot.  PMH: DM2, HTN, orthostatic hypotension, HLD, chronic HFpEF, constrictive pericarditis, chronic a-fib, CKD IV, chronic lymphedema  Clinical Impression  Pt seen for PT evaluation with pt agreeable to tx. Pt reports prior to admission she was at [facility]. Pt with LLE cam boot donned during session. Pt is able to complete bed mobility with min assist, sit>stand with min assist & ambulate short distance (~8 ft) in room with RW & min assist with gait pattern as noted below. Recommend ongoing PT services to progress mobility as able.        If plan is discharge home, recommend the following: A little help with walking and/or transfers;A little help with bathing/dressing/bathroom;Help with stairs or ramp for entrance;Assistance with cooking/housework;Assist for transportation   Can travel by private vehicle   Yes    Equipment Recommendations Other (comment) (defer to next venue)  Recommendations for Other Services  Rehab consult    Functional Status Assessment Patient has had a recent decline in their functional status and demonstrates the ability to make significant improvements in function in a reasonable and predictable amount of time.     Precautions / Restrictions Precautions Precautions: Fall Restrictions Weight Bearing Restrictions Per Provider Order: Yes LLE Weight Bearing Per Provider Order: Weight bearing as tolerated (per podiatry note 10/17 in care everywhere WBAT in cam boot)      Mobility  Bed Mobility Overal bed mobility: Needs Assistance Bed Mobility: Supine to Sit     Supine to sit: Min assist, HOB elevated, Used rails  (exit L side of bed)          Transfers Overall transfer level: Needs assistance Equipment used: Rolling walker (2 wheels) Transfers: Sit to/from Stand Sit to Stand: Min assist           General transfer comment: sit>stand from EOB    Ambulation/Gait Ambulation/Gait assistance: Min assist (chair follow for safety) Gait Distance (Feet): 8 Feet Assistive device: Rolling walker (2 wheels) Gait Pattern/deviations: Decreased step length - right, Decreased step length - left, Decreased stride length, Decreased weight shift to left, Decreased dorsiflexion - left Gait velocity: decreased     General Gait Details: cuing for increased weight bearing through BUE when stepping with RLE to offload LLE, significantly decreased step length BLE  Stairs            Wheelchair Mobility     Tilt Bed    Modified Rankin (Stroke Patients Only)       Balance Overall balance assessment: Needs assistance Sitting-balance support: Feet supported Sitting balance-Leahy Scale: Good     Standing balance support: During functional activity, Bilateral upper extremity supported, Reliant on assistive device for balance Standing balance-Leahy Scale: Fair                               Pertinent Vitals/Pain Pain Assessment Pain Assessment: Faces Faces Pain Scale: Hurts even more Pain Location: sacrum Pain Descriptors / Indicators: Discomfort Pain Intervention(s): Monitored during session, Repositioned    Home Living Family/patient expects to be discharged to:: Skilled nursing facility  Additional Comments: pt is from SNF rehab    Prior Function Prior Level of Function : Independent/Modified Independent;Driving             Mobility Comments: pt reports recent use of RW at rehab, walking short distances; ADLs Comments: prior to hospitalizations over the last few months, MOD I, recent assist for all ADL/IADL at rehab     Extremity/Trunk  Assessment   Upper Extremity Assessment Upper Extremity Assessment: Overall WFL for tasks assessed    Lower Extremity Assessment Lower Extremity Assessment: LLE deficits/detail LLE Deficits / Details: LLE in cam boot       Communication   Communication Communication: No apparent difficulties    Cognition Arousal: Alert Behavior During Therapy: WFL for tasks assessed/performed   PT - Cognitive impairments: No apparent impairments                         Following commands: Intact       Cueing Cueing Techniques: Verbal cues     General Comments General comments (skin integrity, edema, etc.): vss    Exercises     Assessment/Plan    PT Assessment Patient needs continued PT services  PT Problem List Decreased strength;Pain;Decreased range of motion;Decreased activity tolerance;Decreased balance;Decreased mobility;Decreased safety awareness;Decreased knowledge of use of DME;Decreased knowledge of precautions       PT Treatment Interventions Balance training;DME instruction;Gait training;Neuromuscular re-education;Stair training;Functional mobility training;Patient/family education;Therapeutic activities;Therapeutic exercise;Manual techniques    PT Goals (Current goals can be found in the Care Plan section)  Acute Rehab PT Goals Patient Stated Goal: decreased pain PT Goal Formulation: With patient Time For Goal Achievement: 09/04/24 Potential to Achieve Goals: Good    Frequency Min 2X/week     Co-evaluation PT/OT/SLP Co-Evaluation/Treatment: Yes Reason for Co-Treatment: For patient/therapist safety PT goals addressed during session: Mobility/safety with mobility;Balance;Proper use of DME         AM-PAC PT 6 Clicks Mobility  Outcome Measure Help needed turning from your back to your side while in a flat bed without using bedrails?: A Little Help needed moving from lying on your back to sitting on the side of a flat bed without using bedrails?: A  Little Help needed moving to and from a bed to a chair (including a wheelchair)?: A Little Help needed standing up from a chair using your arms (e.g., wheelchair or bedside chair)?: A Little Help needed to walk in hospital room?: A Little Help needed climbing 3-5 steps with a railing? : Total 6 Click Score: 16    End of Session   Activity Tolerance: Patient tolerated treatment well Patient left: in chair;with chair alarm set;with call bell/phone within reach (pressure relieving blue cushion under bottom in chair; educated pt on repositioning every ~15 minutes) Nurse Communication: Mobility status PT Visit Diagnosis: Unsteadiness on feet (R26.81);Muscle weakness (generalized) (M62.81);Difficulty in walking, not elsewhere classified (R26.2);Pain Pain - part of body:  (sacrum)    Time: 9088-9073 PT Time Calculation (min) (ACUTE ONLY): 15 min   Charges:   PT Evaluation $PT Eval Moderate Complexity: 1 Mod   PT General Charges $$ ACUTE PT VISIT: 1 Visit         Karen Farley, PT, DPT 08/21/24, 9:51 AM   Karen Farley 08/21/2024, 9:50 AM

## 2024-08-21 NOTE — Plan of Care (Signed)
  Problem: Coping: Goal: Ability to adjust to condition or change in health will improve Outcome: Progressing   Problem: Nutritional: Goal: Maintenance of adequate nutrition will improve Outcome: Progressing   Problem: Skin Integrity: Goal: Risk for impaired skin integrity will decrease Outcome: Progressing   Problem: Tissue Perfusion: Goal: Adequacy of tissue perfusion will improve Outcome: Progressing   Problem: Activity: Goal: Risk for activity intolerance will decrease Outcome: Progressing   Problem: Coping: Goal: Level of anxiety will decrease Outcome: Progressing   Problem: Elimination: Goal: Will not experience complications related to bowel motility Outcome: Progressing Goal: Will not experience complications related to urinary retention Outcome: Progressing   Problem: Pain Managment: Goal: General experience of comfort will improve and/or be controlled Outcome: Progressing

## 2024-08-21 NOTE — Progress Notes (Signed)
 [facility]  - [place] [facility]   PATIENT NAME: Karen Farley    MR#:  [id number]  DATE OF BIRTH:  21-Jan-1946  SUBJECTIVE:  husband at bedside. [facility] with [facility] of her right lower extremity. Her left foot is in splint. Trying to ambulate using walker at the facility. Complains of weakness.  Pt's leg edema better. Creat up. No cp or sob    VITALS:  Blood pressure 96/73, pulse 88, temperature 98 F (36.7 C), resp. rate 18, height 5' (1.524 m), weight 63.6 kg, SpO2 99%.  PHYSICAL EXAMINATION:   GENERAL:  78 y.o.-year-old patient with no acute distress.  LUNGS: Normal breath sounds bilaterally, no wheezing CARDIOVASCULAR: S1, S2 normal. No murmur   ABDOMEN: Soft, nontender, nondistended. Bowel sounds present.  EXTREMITIES: left lower extremity splint. Right LE + edema NEUROLOGIC: nonfocal  patient is alert and awake SKIN: Wound 08/19/24 1700 Pressure Injury Sacrum Medial Stage 3 -  Full thickness tissue loss. Subcutaneous fat may be visible but bone, tendon or muscle are NOT exposed. (Active)      LABORATORY PANEL:  CBC Recent Labs  Lab 08/19/24 1212  WBC 6.3  HGB 14.3  HCT 45.3  PLT 293    Chemistries  Recent Labs  Lab 08/19/24 1212 08/19/24 2015 08/21/24 0443  NA 134*   < > 130*  K 2.6*   < > 2.9*  CL 88*   < > 86*  CO2 28   < > 24  GLUCOSE 138*   < > 121*  BUN 72*   < > 76*  CREATININE 2.56*   < > 2.72*  CALCIUM  9.4   < > 9.2  MG 2.0  --  2.1  AST 29  --   --   ALT 10  --   --   ALKPHOS 137*  --   --   BILITOT 2.1*  --   --    < > = values in this interval not displayed.   Cardiac Enzymes No results for input(s): TROPONINI in the last 168 hours. RADIOLOGY:  ECHOCARDIOGRAM LIMITED Result Date: 08/21/2024    ECHOCARDIOGRAM LIMITED REPORT   Patient Name:   Karen Farley Date of Exam: 08/21/2024 Medical Rec #:  [id number]          Height:       60.0 in Accession #:    [id number]          Weight:       140.2 lb Date of Birth:  07-04-1946           BSA:          1.605 m Patient Age:    78 years           BP:           94/74 mmHg Patient Gender: F                  HR:           92 bpm. Exam Location:  [facility] Procedure: Limited Echo and Limited Color Doppler (Both Spectral and Color Flow            Doppler were utilized during procedure). Indications:     Pericardial effusion I31.3  History:         Patient has prior history of Echocardiogram examinations, most                  recent 03/23/2024.  Sonographer:     Thedora Louder RDCS, FASE Referring Phys:  [id number] SHERI HAMMOCK Diagnosing Phys: Shelda Bruckner MD  Sonographer Comments: Technically difficult study due to poor echo windows. Image acquisition challenging due to patient body habitus. IMPRESSIONS  1. Left ventricular ejection fraction, by estimation, is 60 to 65%. The left ventricle has normal function. There is moderate concentric left ventricular hypertrophy. Left ventricular diastolic function could not be evaluated.  2. Right ventricular systolic function was not well visualized. The right ventricular size is normal.  3. Left atrial size was moderately to severely dilated.  4. Right atrial size was moderately dilated.  5. There is no evidence of cardiac tamponade.  6. The mitral valve is abnormal. Mild mitral valve regurgitation. Mild mitral stenosis. Severe mitral annular calcification.  7. The aortic valve is calcified. There is severe calcifcation of the aortic valve. There is severe thickening of the aortic valve. Mild aortic valve stenosis. Aortic valve mean gradient measures 13.5 mmHg.  8. The inferior vena cava is normal in size with <50% respiratory variability, suggesting right atrial pressure of 8 mmHg. Comparison(s): No significant change from prior study. FINDINGS  Left Ventricle: Left ventricular ejection fraction, by estimation, is 60 to 65%. The left ventricle has normal function. There is moderate concentric left  ventricular hypertrophy. Left ventricular diastolic function could not be evaluated. Right Ventricle: The right ventricular size is normal. Right vetricular wall thickness was not well visualized. Right ventricular systolic function was not well visualized. Left Atrium: Left atrial size was moderately to severely dilated. Right Atrium: Right atrial size was moderately dilated. Pericardium: Trivial pericardial effusion is present. There is no evidence of cardiac tamponade. Thickening/calcification of pericardium present. Mitral Valve: The mitral valve is abnormal. There is moderate thickening of the mitral valve leaflet(s). There is moderate calcification of the mitral valve leaflet(s). Severe mitral annular calcification. Mild mitral valve regurgitation. Mild mitral valve stenosis. MV peak gradient, 12.5 mmHg. The mean mitral valve gradient is 3.0 mmHg. Aortic Valve: The aortic valve is calcified. There is severe calcifcation of the aortic valve. There is severe thickening of the aortic valve. Mild aortic stenosis is present. Aortic valve mean gradient measures 13.5 mmHg. Aortic valve peak gradient measures 26.1 mmHg. Venous: The inferior vena cava is normal in size with less than 50% respiratory variability, suggesting right atrial pressure of 8 mmHg. IAS/Shunts: The atrial septum is grossly normal. LEFT VENTRICLE PLAX 2D LVIDd:         3.10 cm LVIDs:         2.30 cm LV PW:         1.20 cm LV IVS:        1.20 cm  LEFT ATRIUM           Index LA diam:      5.00 cm 3.12 cm/m LA Vol (A4C): 51.7 ml 32.21 ml/m  AORTIC VALVE AV Vmax:      255.50 cm/s AV Vmean:     169.000 cm/s AV VTI:       0.420 m AV Peak Grad: 26.1 mmHg AV Mean Grad: 13.5 mmHg MITRAL VALVE MV Area (PHT): 3.89 cm MV Peak grad:  12.5 mmHg MV Mean grad:  3.0 mmHg MV Vmax:       1.77 m/s MV Vmean:      73.7 cm/s MV Decel Time: 195 msec MV E velocity: 136.00 cm/s MV A velocity: 53.90 cm/s MV E/A ratio:  2.52 Shelda Bruckner MD Electronically signed  by Shelda Bruckner  MD Signature Date/Time: 08/21/2024/1:03:17 PM    Final    US  Venous Img Lower Bilateral (DVT) Result Date: 08/20/2024 CLINICAL DATA:  Lower extremity edema. EXAM: BILATERAL LOWER EXTREMITY VENOUS DOPPLER ULTRASOUND TECHNIQUE: Gray-scale sonography with graded compression, as well as color Doppler and duplex ultrasound were performed to evaluate the lower extremity deep venous systems from the level of the common femoral vein and including the common femoral, femoral, profunda femoral, popliteal and calf veins including the posterior tibial, peroneal and gastrocnemius veins when visible. The superficial great saphenous vein was also interrogated. Spectral Doppler was utilized to evaluate flow at rest and with distal augmentation maneuvers in the common femoral, femoral and popliteal veins. COMPARISON:  None Available. FINDINGS: RIGHT LOWER EXTREMITY Common Femoral Vein: No evidence of thrombus. Normal compressibility, respiratory phasicity and response to augmentation. Saphenofemoral Junction: No evidence of thrombus. Normal compressibility and flow on color Doppler imaging. Profunda Femoral Vein: No evidence of thrombus. Normal compressibility and flow on color Doppler imaging. Femoral Vein: Flow present throughout the right femoral vein. There is lack of complete compressibility of the distal right femoral vein with suggestion of some posterior mural thrombus. 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 Popliteal Vein: No evidence of thrombus. Normal compressibility, respiratory phasicity and response to augmentation. Calf Veins: No evidence of thrombus. Normal compressibility and flow on color Doppler imaging. Superficial Great Saphenous Vein: No evidence of thrombus. Normal compressibility. Venous Reflux:  None. Other Findings: No evidence of superficial thrombophlebitis or abnormal fluid collection. LEFT LOWER EXTREMITY Common Femoral Vein: No evidence of  thrombus. Normal compressibility, respiratory phasicity and response to augmentation. Saphenofemoral Junction: No evidence of thrombus. Normal compressibility and flow on color Doppler imaging. Profunda Femoral Vein: No evidence of thrombus. Normal compressibility and flow on color Doppler imaging. Femoral Vein: No evidence of thrombus. Normal compressibility, respiratory phasicity and response to augmentation. Popliteal Vein: No evidence of thrombus. Normal compressibility, respiratory phasicity and response to augmentation. Calf Veins: Left calf veins not adequately visualized for evaluation. Superficial Great Saphenous Vein: No evidence of thrombus. Normal compressibility. Venous Reflux:  None. Other Findings: No evidence of superficial thrombophlebitis or abnormal fluid collection. IMPRESSION: 1. No evidence of acute deep venous thrombosis in either lower extremity. 2. Lack of complete compressibility of the distal right femoral vein with suggestion of some posterior mural thrombus. 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 3. Left calf veins not adequately visualized for evaluation. Electronically Signed   By: Marcey Moan M.D.   On: 08/20/2024 08:21    Assessment and Plan BRITTANI PURDUM is a 78 y.o. female with medical history significant of IIDM, HTN, orthostatic hypotension on midodrine , HLD, chronic HFpEF, constrictive pericarditis, chronic A-fib, CKD stage IV, chronic lymphedema sent from nursing home for evaluation of severe hypokalemia.  Patient came in with increased weight gain of about 15 pounds in last few days. She is significantly edema and lower extremity more on the right. Denies any shortness of breath however she has been not much active given her left ankle fracture.  Chest x-ray-- Showing pulmonary congestion with small bilateral pleural effusions,   Severe hypokalemia -Likely secondary to diuresis - IV and p.o. replacement - Rate check  potassium level tonight - Magnesium  level= 2.0, will check phosphorus level --K repleted   Acute on chronic HFpEF decompensation/Constrictive pericarditis History of mild to moderate aortic stenosis History of moderate MR - With symptoms signs of fluid overload - Change p.o.  diuresis to IV Lasix  40 mg twice dailyx 2 doses - Monitor potassium level - Hold off metolazone  -- cardiology consultation with [facility] cardiology Dr. Darron --change to po torsemide  with worsening creat --pt has issue with diuretic resistance   Chronic A-fib - Rate controlled, will use as needed metoprolol  if needed - Not on anticoagulation, used to be on Eliquis  which was discontinued in June probably because of newly developed iron  deficiency anemia. - Continue aspirin    CKD stage IV - Volume overloaded, creatinine level stable - change to po lasix    Chronic hypotension - BP stable, continue midodrine    IIDM - SSI   Stage II pressure ulcer, POA - Developed over the last few weeks due to immobility - Status post 2 weeks of IV antibiotics recently. - Outpatient wound care center follow-up   H/o Nondisplaced Left lateral malleolus fracture of fibula in Sept 2025 S/p left LE splint --PT to see pt --pt follows with Dr Lennie at [facility]  Procedures: Family communication :husband Consults :[facility] CODE STATUS: full DVT Prophylaxis :heparin  Level of care: Telemetry will require IV Lasix  for diuresis due to volume overload.    TOTAL TIME TAKING CARE OF THIS PATIENT: 40 minutes.  >50% time spent on counselling and coordination of care  Note: This dictation was prepared with Dragon dictation along with smaller phrase technology. Any transcriptional errors that result from this process are unintentional.  [name] M.D    [facility]   CC: Primary care physician; Vicci Duwaine SQUIBB, DO

## 2024-08-21 NOTE — Progress Notes (Signed)
 PHARMACY CONSULT NOTE - ELECTROLYTES  Pharmacy Consult for Electrolyte Monitoring and Replacement   Recent Labs: Height: 5' (152.4 cm) Weight: 63.6 kg (140 lb 3.4 oz) IBW/kg (Calculated) : 45.5 Estimated Creatinine Clearance: 14.2 mL/min (A) (by C-G formula based on SCr of 2.72 mg/dL (H)). Potassium (mmol/L)  Date Value  08/21/2024 2.9 (L)   Magnesium  (mg/dL)  Date Value  88/98/7974 2.1   Calcium  (mg/dL)  Date Value  88/98/7974 9.2   Albumin  (g/dL)  Date Value  89/69/7974 3.3 (L)  12/19/2023 4.1   Phosphorus (mg/dL)  Date Value  89/69/7974 2.7   Sodium (mmol/L)  Date Value  08/21/2024 130 (L)  12/19/2023 133 (L)   Assessment  Karen Farley is a 78 y.o. female presenting with acute HF and low extremity edema. PMH significant for IIDM, HTN, orthostatic hypotension on midodrine , HLD, chronic HFpEF, constrictive pericarditis, chronic A-fib, CKD stage IV . Pharmacy has been consulted to monitor and replace electrolytes.  Diet: heart healthy/carb modified MIVF: IV lock Pertinent medications: furosemide , spironolactone   Goal of Therapy: Electrolytes WNL, optimizer K and Mg per card indication  Plan:  Na 130 from 134 yesterday. Likely secondary to diuretics K 2.9, Kcl 40 mEq PO BID x 2 doses Check BMP, Mg with AM labs  Karen Farley 08/21/2024 7:17 AM

## 2024-08-21 NOTE — Consult Note (Signed)
 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 Change daily Turn and reposition per hospital policy    Re consult if needed, will not follow at this time. Thanks  Jehiel [employer], RN,CWOCN, CNS, [employer] [phone]

## 2024-08-21 NOTE — Evaluation (Signed)
 Occupational Therapy Evaluation Patient Details Name: Karen Farley MRN: [id number] DOB: 1945/12/28 Today's Date: 08/21/2024   History of Present Illness   Pt is a 78 year old female sent from nursing home for evaluation of severe hypokalemia, Acute on chronic HFpEF decompensation/Constrictive pericarditis    PMH significant for  IIDM, HTN, orthostatic hypotension on midodrine , HLD, chronic HFpEF, constrictive pericarditis, chronic A-fib, CKD stage IV, chronic lymphedema, stage II pressure ulcer     Clinical Impressions Chart reviewed to date, pt greeted semi supine in bed, agreeable to OT evaluation. PTA pt reports she is at rehab where she is amb short distances with RW and cam boot, requires assist for ADL/IADL; Prior to June 2025 pt was MOD I in ADL. Pt presents with deficits in strength, endurance, activity tolerance, balance, affecting safe and optimal ADL completion. She requires MAX A for bed mobility, MIN A with RW to stand, MIN A +2 for chair follow to amb approx 8'. SET UP required for grooming/feeding tasks. Pt is educated re: pressure reliving techniques to decrease further breakdown and encouraged OOB with staff assist. Pt is performing ADL/functional mobility below PLOF, will benefit from acute OT to address functional deficits and to facilitate optimal ADL/functional mobility performance. Pt is left in chair, all needs met. OT will continue to follow.      If plan is discharge home, recommend the following:   A little help with walking and/or transfers;A lot of help with bathing/dressing/bathroom     Functional Status Assessment   Patient has had a recent decline in their functional status and demonstrates the ability to make significant improvements in function in a reasonable and predictable amount of time.     Equipment Recommendations   Other (comment) (defer to next venue of care)     Recommendations for Other Services          Precautions/Restrictions   Precautions Precautions: Fall Recall of Precautions/Restrictions: Intact Restrictions Weight Bearing Restrictions Per Provider Order: Yes LLE Weight Bearing Per Provider Order: Weight bearing as tolerated (per podiatry note 10/17 in care everywhere WBAT in cam boot)     Mobility Bed Mobility Overal bed mobility: Needs Assistance Bed Mobility: Supine to Sit     Supine to sit: Max assist, HOB elevated          Transfers Overall transfer level: Needs assistance Equipment used: Rolling walker (2 wheels) Transfers: Sit to/from Stand Sit to Stand: Min assist                  Balance Overall balance assessment: Needs assistance Sitting-balance support: Feet supported Sitting balance-Leahy Scale: Good     Standing balance support: Bilateral upper extremity supported, During functional activity, Reliant on assistive device for balance Standing balance-Leahy Scale: Fair                             ADL either performed or assessed with clinical judgement   ADL Overall ADL's : Needs assistance/impaired Eating/Feeding: Set up   Grooming: Set up;Sitting               Lower Body Dressing: Maximal assistance Lower Body Dressing Details (indicate cue type and reason): doff cam boot Toilet Transfer: Minimal assistance;Rolling walker (2 wheels) Toilet Transfer Details (indicate cue type and reason): simulated to the chair Toileting- Clothing Manipulation and Hygiene: Maximal assistance;Sit to/from stand Toileting - Clothing Manipulation Details (indicate cue type and reason): peri care  Functional mobility during ADLs: Minimal assistance;+2 for safety/equipment;Rolling walker (2 wheels) (for close chair follow, approx 8')       Vision Patient Visual Report: No change from baseline       Perception         Praxis         Pertinent Vitals/Pain Pain Assessment Pain Assessment: Faces Faces Pain Scale: Hurts  even more Pain Location: sacrum Pain Descriptors / Indicators: Discomfort Pain Intervention(s): Repositioned, Monitored during session     Extremity/Trunk Assessment Upper Extremity Assessment Upper Extremity Assessment: Generalized weakness   Lower Extremity Assessment Lower Extremity Assessment: Defer to PT evaluation       Communication Communication Communication: No apparent difficulties   Cognition Arousal: Alert Behavior During Therapy: WFL for tasks assessed/performed Cognition: Cognition impaired         Attention impairment (select first level of impairment): Selective attention Executive functioning impairment (select all impairments): Problem solving OT - Cognition Comments: will continue to assess                 Following commands: Intact       Cueing  General Comments   Cueing Techniques: Verbal cues  vss   Exercises Other Exercises Other Exercises: edu re role of OT, role of rehab, discharge recommendations   Shoulder Instructions      Home Living Family/patient expects to be discharged to:: Skilled nursing facility                                 Additional Comments: pt is from SNF rehab      Prior Functioning/Environment               Mobility Comments: pt reports recent use of RW at rehab, walking short distances; ADLs Comments: prior to hospitalizations over the last few months, MOD I, recent assist for all ADL/IADL at rehab    OT Problem List: Decreased strength;Decreased activity tolerance;Impaired balance (sitting and/or standing);Decreased knowledge of use of DME or AE   OT Treatment/Interventions: Self-care/ADL training;Balance training;Therapeutic exercise;Therapeutic activities;Cognitive remediation/compensation;DME and/or AE instruction;Patient/family education      OT Goals(Current goals can be found in the care plan section)   Acute Rehab OT Goals Patient Stated Goal: rehab OT Goal  Formulation: With patient Time For Goal Achievement: 09/04/24 Potential to Achieve Goals: Good ADL Goals Pt Will Perform Grooming: with modified independence;sitting;standing Pt Will Perform Lower Body Dressing: with min assist;sitting/lateral leans;sit to/from stand Pt Will Transfer to Toilet: with modified independence;ambulating Pt Will Perform Toileting - Clothing Manipulation and hygiene: with modified independence;sitting/lateral leans;sit to/from stand   OT Frequency:  Min 2X/week    Co-evaluation PT/OT/SLP Co-Evaluation/Treatment: Yes Reason for Co-Treatment: To address functional/ADL transfers;For patient/therapist safety;Complexity of the patient's impairments (multi-system involvement)          AM-PAC OT 6 Clicks Daily Activity     Outcome Measure Help from another person eating meals?: None Help from another person taking care of personal grooming?: None Help from another person toileting, which includes using toliet, bedpan, or urinal?: A Lot Help from another person bathing (including washing, rinsing, drying)?: A Lot   Help from another person to put on and taking off regular lower body clothing?: A Lot 6 Click Score: 14   End of Session Equipment Utilized During Treatment: Rolling walker (2 wheels)  Activity Tolerance: Patient tolerated treatment well Patient left: in chair;with call bell/phone within reach;with chair alarm  set (with pressure relieving pillow under bottom)  OT Visit Diagnosis: Other abnormalities of gait and mobility (R26.89);Muscle weakness (generalized) (M62.81)                Time: 9091-9071 OT Time Calculation (min): 20 min Charges:  OT General Charges $OT Visit: 1 Visit OT Evaluation $OT Eval Low Complexity: 1 Low  Therisa Sheffield, OTD OTR/L  08/21/24, 9:46 AM

## 2024-08-22 DIAGNOSIS — I425 Other restrictive cardiomyopathy: Secondary | ICD-10-CM | POA: Diagnosis not present

## 2024-08-22 DIAGNOSIS — I4821 Permanent atrial fibrillation: Secondary | ICD-10-CM | POA: Diagnosis not present

## 2024-08-22 DIAGNOSIS — I5082 Biventricular heart failure: Secondary | ICD-10-CM

## 2024-08-22 DIAGNOSIS — I311 Chronic constrictive pericarditis: Secondary | ICD-10-CM

## 2024-08-22 LAB — BASIC METABOLIC PANEL WITH GFR
Anion gap: 19 — ABNORMAL HIGH (ref 5–15)
BUN: 74 mg/dL — ABNORMAL HIGH (ref 8–23)
CO2: 26 mmol/L (ref 22–32)
Calcium: 9.5 mg/dL (ref 8.9–10.3)
Chloride: 90 mmol/L — ABNORMAL LOW (ref 98–111)
Creatinine, Ser: 2.46 mg/dL — ABNORMAL HIGH (ref 0.44–1.00)
GFR, Estimated: 20 mL/min — ABNORMAL LOW (ref >60)
Glucose, Bld: 119 mg/dL — ABNORMAL HIGH (ref 70–99)
Potassium: 3.1 mmol/L — ABNORMAL LOW (ref 3.5–5.1)
Sodium: 135 mmol/L (ref 135–145)

## 2024-08-22 LAB — GLUCOSE, CAPILLARY
Glucose-Capillary: 119 mg/dL — ABNORMAL HIGH (ref 70–99)
Glucose-Capillary: 190 mg/dL — ABNORMAL HIGH (ref 70–99)
Glucose-Capillary: 135 mg/dL — ABNORMAL HIGH (ref 70–99)
Glucose-Capillary: 129 mg/dL — ABNORMAL HIGH (ref 70–99)

## 2024-08-22 LAB — MAGNESIUM: Magnesium: 2.1 mg/dL (ref 1.7–2.4)

## 2024-08-22 MED ORDER — POTASSIUM CHLORIDE 20 MEQ PO PACK
40.0000 meq | PACK | Freq: Two times a day (BID) | ORAL | Status: AC
  Administered 2024-08-22 (×2): 40 meq via ORAL
  Filled 2024-08-22 (×2): qty 2

## 2024-08-22 MED ORDER — METOLAZONE 5 MG PO TABS
5.0000 mg | ORAL_TABLET | ORAL | Status: DC
  Administered 2024-08-22: 5 mg via ORAL
  Filled 2024-08-22: qty 1

## 2024-08-22 NOTE — Plan of Care (Signed)

## 2024-08-22 NOTE — Care Management Important Message (Signed)
 Important Message  Patient Details  Name: Karen Farley MRN: [id number] Date of Birth: 11/23/1945   Important Message Given:  Yes - Medicare IM     [name] 08/22/2024, 6:01 PM

## 2024-08-22 NOTE — Plan of Care (Signed)
  Problem: Education: Goal: Ability to describe self-care measures that may prevent or decrease complications (Diabetes Survival Skills Education) will improve Outcome: Progressing   Problem: Coping: Goal: Ability to adjust to condition or change in health will improve Outcome: Progressing   Problem: Metabolic: Goal: Ability to maintain appropriate glucose levels will improve Outcome: Progressing   Problem: Skin Integrity: Goal: Risk for impaired skin integrity will decrease Outcome: Progressing   Problem: Tissue Perfusion: Goal: Adequacy of tissue perfusion will improve Outcome: Progressing   Problem: Health Behavior/Discharge Planning: Goal: Ability to manage health-related needs will improve Outcome: Progressing   Problem: Clinical Measurements: Goal: Cardiovascular complication will be avoided Outcome: Progressing

## 2024-08-22 NOTE — Progress Notes (Signed)
 Rounding Note   Patient Name: Karen Farley Date of Encounter: 08/22/2024  [facility] Cardiologist: Evalene Lunger, MD   Subjective Patient seen on a.m. rounds.  Denies any chest pain or shortness of breath.  Swelling has improved. +140 mL in the last 24 hours, likely inaccurate I's and O's. Sitting up in bed eating breakfast with only complaint of sacral discomfort.  Scheduled Meds:  allopurinol   100 mg Oral Daily   aspirin  EC  81 mg Oral Daily   empagliflozin   25 mg Oral Daily   heparin   5,000 Units Subcutaneous Q12H   insulin  aspart  0-9 Units Subcutaneous TID WC   latanoprost   1 drop Both Eyes QHS   midodrine   5 mg Oral TID WC   pantoprazole   40 mg Oral BID   polyethylene glycol  17 g Oral BID   potassium chloride   40 mEq Oral BID   sodium chloride  flush  3 mL Intravenous Q12H   spironolactone   12.5 mg Oral Daily   torsemide   60 mg Oral Daily   Continuous Infusions:  PRN Meds: acetaminophen , bisacodyl , metoprolol  tartrate, ondansetron  (ZOFRAN ) IV, oxyCODONE , sodium chloride  flush, temazepam   Vital Signs  Vitals:   08/21/24 2005 08/21/24 2356 08/22/24 0423 08/22/24 0500  BP: 98/70 93/72 116/82   Pulse: 71 83 81   Resp: 18 18 17    Temp: 98.3 F (36.8 C) 97.7 F (36.5 C) 97.6 F (36.4 C)   TempSrc: Oral Oral Oral   SpO2: 98% 99% 100%   Weight:    63.7 kg  Height:        Intake/Output Summary (Last 24 hours) at 08/22/2024 0756 Last data filed at 08/21/2024 2130 Gross per 24 hour  Intake 440 ml  Output 300 ml  Net 140 ml      08/22/2024    5:00 AM 08/21/2024    5:00 AM 08/20/2024    5:00 AM  Last 3 Weights  Weight (lbs) 140 lb 6.9 oz 140 lb 3.4 oz 140 lb 3.4 oz  Weight (kg) 63.7 kg 63.6 kg 63.6 kg      Telemetry Rate controlled atrial fibrillation- Personally Reviewed  ECG  No new tracings- Personally Reviewed  Physical Exam  GEN: No acute distress.   Neck: No JVD Cardiac: IR IR, II/VI systolic murmur RUSB, without rubs or  gallops.  Respiratory: Clear with diminished bases to auscultation bilaterally.  Respirations are unlabored at rest on room air GI: Soft, nontender, non-distended  MS: 1+ edema right lower extremity, left lower extremity is in a walking boot; No deformity. Neuro:  Nonfocal  Psych: Normal affect   Labs High Sensitivity Troponin:  No results for input(s): TROPONINIHS in the last 720 hours.   Chemistry Recent Labs  Lab 08/19/24 1212 08/19/24 2015 08/20/24 0328 08/21/24 0443 08/22/24 0530  NA 134*  --  134* 130* 135  K 2.6*   < > 3.3* 2.9* 3.1*  CL 88*  --  91* 86* 90*  CO2 28  --  26 24 26   GLUCOSE 138*  --  111* 121* 119*  BUN 72*  --  74* 76* 74*  CREATININE 2.56*  --  2.47* 2.72* 2.46*  CALCIUM  9.4  --  9.1 9.2 9.5  MG 2.0  --   --  2.1 2.1  PROT 7.0  --   --   --   --   ALBUMIN  3.3*  --   --   --   --   AST 29  --   --   --   --  ALT 10  --   --   --   --   ALKPHOS 137*  --   --   --   --   BILITOT 2.1*  --   --   --   --   GFRNONAA 19*  --  19* 17* 20*  ANIONGAP 18*  --  17* 20* 19*   < > = values in this interval not displayed.    Lipids No results for input(s): CHOL, TRIG, HDL, LABVLDL, LDLCALC, CHOLHDL in the last 168 hours.  Hematology Recent Labs  Lab 08/19/24 1212  WBC 6.3  RBC 5.90*  HGB 14.3  HCT 45.3  MCV 76.8*  MCH 24.2*  MCHC 31.6  RDW 22.9*  PLT 293   Thyroid  No results for input(s): TSH, FREET4 in the last 168 hours.  BNP Recent Labs  Lab 08/19/24 1212  BNP 242.5*    DDimer No results for input(s): DDIMER in the last 168 hours.   Radiology    Cardiac Studies Limited 2d echo 08/21/2024 1. Left ventricular ejection fraction, by estimation, is 60 to 65%. The  left ventricle has normal function. There is moderate concentric left  ventricular hypertrophy. Left ventricular diastolic function could not be  evaluated.   2. Right ventricular systolic function was not well visualized. The right  ventricular size is normal.    3. Left atrial size was moderately to severely dilated.   4. Right atrial size was moderately dilated.   5. There is no evidence of cardiac tamponade.   6. The mitral valve is abnormal. Mild mitral valve regurgitation. Mild  mitral stenosis. Severe mitral annular calcification.   7. The aortic valve is calcified. There is severe calcifcation of the  aortic valve. There is severe thickening of the aortic valve. Mild aortic  valve stenosis. Aortic valve mean gradient measures 13.5 mmHg.   8. The inferior vena cava is normal in size with <50% respiratory  variability, suggesting right atrial pressure of 8 mmHg.   2d echo 03/20/2024 1. Intracavitary gradient. Peak velocity 2.31 m/s. Peak gradient 21.3  mmHg. Left ventricular ejection fraction, by estimation, is 60 to 65%. The  left ventricle has normal function. The left ventricle has no regional  wall motion abnormalities. There is  moderate concentric left ventricular hypertrophy.   2. Right ventricular systolic function is normal. The right ventricular  size is normal. There is normal pulmonary artery systolic pressure.   3. Mostly lateral to the RV. There is also echogenic material in the  pericardium lateral to the RV apex. This is visualized on prior echo as  well. May represent fat pad. However, it may also represent evidence of  prior hemopericardium. a small  pericardial effusion is present. There is no evidence of cardiac  tamponade.   4. Mobile MAC on the posterior mitral valve annulus. The mitral valve is  normal in structure. Mild mitral valve regurgitation. Mild mitral  stenosis. The mean mitral valve gradient is 3.5 mmHg with average heart  rate of 79 bpm. Severe mitral annular  calcification.   5. The aortic valve is normal in structure. There is severe calcifcation  of the aortic valve. There is severe thickening of the aortic valve.  Aortic valve regurgitation is not visualized. Mild aortic valve stenosis.  Aortic  valve area, by VTI measures  1.10 cm. Aortic valve mean gradient measures 13.8 mmHg. Aortic valve Vmax  measures 2.57 m/s.   6. The inferior vena cava is normal in size with  greater than 50%  respiratory variability, suggesting right atrial pressure of 3 mmHg.    Echo limited 10/2021 1. Left ventricular ejection fraction, by estimation, is 60 to 65%. The  left ventricle has normal function. The left ventricle has no regional  wall motion abnormalities. There is mild left ventricular hypertrophy.  Left ventricular diastolic parameters  are indeterminate.   2. Right ventricular systolic function is normal. The right ventricular  size is normal.   3. A small to moderate sized pericardial effusion is present, 1.1 Cm  circumferential, pocket of 2.5 cm off the LV free wall. No tamponade  present.   4. The mitral valve is normal in structure. Moderate mitral valve  regurgitation. No evidence of mitral stenosis. Moderate mitral annular  calcification.   5. Aortic valve has moderate calcification of the aortic valve. Aortic  valve regurgitation is not visualized.Degree of stenosis not measured.   6. The inferior vena cava is normal in size with greater than 50%  respiratory variability, suggesting right atrial pressure of 3 mmHg.    Comparison(s): Moderate pericardial effusion. Largest 1.9 cm LV free wall,  Moderate MR.    Limited Echo 08/2021  1. Left pleural effusion noted, 5 cm   2. Moderate pericardial effusion, estimated 1.9 off the LV free wall,  minimal off the RV free wall (0.4 cm) . There is no evidence of cardiac  tamponade.   3. Left ventricular ejection fraction, by estimation, is 60 to 65%. The  left ventricle has normal function. The left ventricle has no regional  wall motion abnormalities. There is mild left ventricular hypertrophy.   4. Right ventricular systolic function is normal. The right ventricular  size is normal.   5. Left atrial size was severely dilated.   6.  The mitral valve is normal in structure. Moderate mitral valve  regurgitation. No evidence of mitral stenosis.   7. The aortic valve is normal in structure. Aortic valve regurgitation is  not visualized. Mild to moderate aortic valve sclerosis/calcification is  present, without any evidence of aortic stenosis.   Patient Profile   78 y.o. 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 -Presented with worsening swelling to bilateral lower extremities and upper extremities -Difficult to determine volume status due to body habitus -BNP 242.5 -LVEF 60 to 65% with moderate to severe AS, normal RV on echo read -NYHA class III symptoms -Continued on spironolactone  12.5 mg daily -Continued on torsemide  60 mg daily -Continued on Jardiance  25 mg daily -Continue with outpatient heart failure follow-up -Heart failure education -Daily weights and I's and O's  Hypotension -Blood pressure 113/77 -Continue to midodrine  5 mg 3 times daily -Vital signs per unit protocol  Permanent atrial fibrillation -Continues to remain rate controlled -No longer on apixaban  due to iron  deficiency anemia -Continue aspirin  81 mg daily -Continue with telemetry monitoring  CKD stage IV -Serum creatinine 2.46 -Baseline serum creatinine 2.5-2.6 -Monitor urine output -Monitor/trend/replete electrolytes as needed -Followed by nephrology  Hypokalemia -Serum potassium this morning 3.1 -Supplementations given -Recommend keeping potassium greater than 4 less than 5 -Daily BMP  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 -Has been stable on studies but suspect somewhat  contributing to her symptoms  Chronic lymphedema -  Edema has improved with elevation - Change IV back to oral diuretic therapy  Sacral decubitus stage II, POA -Previously been on 2 weeks worth of IV antibiotics -Outpatient wound center follow-up -Ongoing management per IM  Diabetes -Continue on sliding scale insulin  -Ongoing management per IM  Small pericardial effusion -History of pericardial effusion status post pericarditis with removal of 7 mL in 07/2021 bloody but was negative for malignancy -Noted on repeat echocardiogram small without tamponade on -Cardiac MRI with stable    For questions or updates, please contact [facility] Please consult [email or web address] for contact info under       Signed, Eryn Krejci, NP  08/22/2024, 7:56 AM

## 2024-08-22 NOTE — Progress Notes (Signed)
 PHARMACY CONSULT NOTE - ELECTROLYTES  Pharmacy Consult for Electrolyte Monitoring and Replacement   Recent Labs: Height: 5' (152.4 cm) Weight: 63.7 kg (140 lb 6.9 oz) IBW/kg (Calculated) : 45.5 Estimated Creatinine Clearance: 15.7 mL/min (A) (by C-G formula based on SCr of 2.46 mg/dL (H)). Potassium (mmol/L)  Date Value  08/22/2024 3.1 (L)   Magnesium  (mg/dL)  Date Value  88/97/7974 2.1   Calcium  (mg/dL)  Date Value  88/97/7974 9.5   Albumin  (g/dL)  Date Value  89/69/7974 3.3 (L)  12/19/2023 4.1   Phosphorus (mg/dL)  Date Value  89/69/7974 2.7   Sodium (mmol/L)  Date Value  08/22/2024 135  12/19/2023 133 (L)   Assessment  Karen Farley is a 78 y.o. female presenting with acute HF and low extremity edema. PMH significant for IIDM, HTN, orthostatic hypotension on midodrine , HLD, chronic HFpEF, constrictive pericarditis, chronic A-fib, CKD stage IV . Pharmacy has been consulted to monitor and replace electrolytes.  Diet: heart healthy/carb modified MIVF: IV lock Pertinent medications: furosemide  >> torsemide , spironolactone   Goal of Therapy: Electrolytes WNL, optimizer K and Mg per card indication  Plan:  K 3.1, Kcl 40 mEq PO BID x 2 doses. Diuretics changed from IV Lasix  to torsemide  by cardiology Check BMP with AM labs  Karen Farley Karen Farley Karen Farley 08/22/2024 7:17 AM

## 2024-08-22 NOTE — Progress Notes (Signed)
 [facility]  - [place] [facility]   PATIENT NAME: Karen Farley    MR#:  [id number]  DATE OF BIRTH:  14-Sep-1946  SUBJECTIVE:   [facility] with increased swelling of her right lower extremity. Her left foot is in splint. Trying to ambulate using walker at the facility. Complains of weakness.  Pt's leg edema better. Creat up. No cp or sob    VITALS:  Blood pressure 105/68, pulse 92, temperature 97.7 F (36.5 C), temperature source Oral, resp. rate 16, height 5' (1.524 m), weight 63.7 kg, SpO2 98%.  PHYSICAL EXAMINATION:   GENERAL:  78 y.o.-year-old patient with no acute distress.  LUNGS: Normal breath sounds bilaterally, no wheezing CARDIOVASCULAR: S1, S2 normal. No murmur   ABDOMEN: Soft, nontender, nondistended. Bowel sounds present.  EXTREMITIES: left lower extremity splint. Right LE + edema NEUROLOGIC: nonfocal  patient is alert and awake SKIN: Wound 08/19/24 1700 Pressure Injury Sacrum Medial Stage 3 -  Full thickness tissue loss. Subcutaneous fat may be visible but bone, tendon or muscle are NOT exposed. (Active)      LABORATORY PANEL:  CBC Recent Labs  Lab 08/19/24 1212  WBC 6.3  HGB 14.3  HCT 45.3  PLT 293    Chemistries  Recent Labs  Lab 08/19/24 1212 08/19/24 2015 08/22/24 0530  NA 134*   < > 135  K 2.6*   < > 3.1*  CL 88*   < > 90*  CO2 28   < > 26  GLUCOSE 138*   < > 119*  BUN 72*   < > 74*  CREATININE 2.56*   < > 2.46*  CALCIUM  9.4   < > 9.5  MG 2.0   < > 2.1  AST 29  --   --   ALT 10  --   --   ALKPHOS 137*  --   --   BILITOT 2.1*  --   --    < > = values in this interval not displayed.   Cardiac Enzymes No results for input(s): TROPONINI in the last 168 hours. RADIOLOGY:  ECHOCARDIOGRAM LIMITED Result Date: 08/21/2024    ECHOCARDIOGRAM LIMITED REPORT   Patient Name:   Karen Farley Date of Exam: 08/21/2024 Medical Rec #:  [id number]          Height:       60.0 in Accession #:    [id number]          Weight:       140.2 lb Date of Birth:  1945/10/29           BSA:          1.605 m Patient Age:    78 years           BP:           94/74 mmHg Patient Gender: F                  HR:           92 bpm. Exam Location:  [facility] Procedure: Limited Echo and Limited Color Doppler (Both Spectral and Color Flow            Doppler were utilized during procedure). Indications:     Pericardial effusion I31.3  History:         Patient has prior history of Echocardiogram examinations, most  recent 03/23/2024.  Sonographer:     Thedora Louder RDCS, FASE Referring Phys:  [id number] SHERI HAMMOCK Diagnosing Phys: Shelda Bruckner MD  Sonographer Comments: Technically difficult study due to poor echo windows. Image acquisition challenging due to patient body habitus. IMPRESSIONS  1. Left ventricular ejection fraction, by estimation, is 60 to 65%. The left ventricle has normal function. There is moderate concentric left ventricular hypertrophy. Left ventricular diastolic function could not be evaluated.  2. Right ventricular systolic function was not well visualized. The right ventricular size is normal.  3. Left atrial size was moderately to severely dilated.  4. Right atrial size was moderately dilated.  5. There is no evidence of cardiac tamponade.  6. The mitral valve is abnormal. Mild mitral valve regurgitation. Mild mitral stenosis. Severe mitral annular calcification.  7. The aortic valve is calcified. There is severe calcifcation of the aortic valve. There is severe thickening of the aortic valve. Mild aortic valve stenosis. Aortic valve mean gradient measures 13.5 mmHg.  8. The inferior vena cava is normal in size with <50% respiratory variability, suggesting right atrial pressure of 8 mmHg. Comparison(s): No significant change from prior study. FINDINGS  Left Ventricle: Left ventricular ejection fraction, by estimation, is 60 to 65%. The left ventricle has normal function. There is moderate concentric left  ventricular hypertrophy. Left ventricular diastolic function could not be evaluated. Right Ventricle: The right ventricular size is normal. Right vetricular wall thickness was not well visualized. Right ventricular systolic function was not well visualized. Left Atrium: Left atrial size was moderately to severely dilated. Right Atrium: Right atrial size was moderately dilated. Pericardium: Trivial pericardial effusion is present. There is no evidence of cardiac tamponade. Thickening/calcification of pericardium present. Mitral Valve: The mitral valve is abnormal. There is moderate thickening of the mitral valve leaflet(s). There is moderate calcification of the mitral valve leaflet(s). Severe mitral annular calcification. Mild mitral valve regurgitation. Mild mitral valve stenosis. MV peak gradient, 12.5 mmHg. The mean mitral valve gradient is 3.0 mmHg. Aortic Valve: The aortic valve is calcified. There is severe calcifcation of the aortic valve. There is severe thickening of the aortic valve. Mild aortic stenosis is present. Aortic valve mean gradient measures 13.5 mmHg. Aortic valve peak gradient measures 26.1 mmHg. Venous: The inferior vena cava is normal in size with less than 50% respiratory variability, suggesting right atrial pressure of 8 mmHg. IAS/Shunts: The atrial septum is grossly normal. LEFT VENTRICLE PLAX 2D LVIDd:         3.10 cm LVIDs:         2.30 cm LV PW:         1.20 cm LV IVS:        1.20 cm  LEFT ATRIUM           Index LA diam:      5.00 cm 3.12 cm/m LA Vol (A4C): 51.7 ml 32.21 ml/m  AORTIC VALVE AV Vmax:      255.50 cm/s AV Vmean:     169.000 cm/s AV VTI:       0.420 m AV Peak Grad: 26.1 mmHg AV Mean Grad: 13.5 mmHg MITRAL VALVE MV Area (PHT): 3.89 cm MV Peak grad:  12.5 mmHg MV Mean grad:  3.0 mmHg MV Vmax:       1.77 m/s MV Vmean:      73.7 cm/s MV Decel Time: 195 msec MV E velocity: 136.00 cm/s MV A velocity: 53.90 cm/s MV E/A ratio:  2.52 Shelda Bruckner MD Electronically signed  by Shelda Bruckner MD Signature Date/Time: 08/21/2024/1:03:17 PM    Final     Assessment and Plan Karen Farley is a 78 y.o. female with medical history significant of IIDM, HTN, orthostatic hypotension on midodrine , HLD, chronic HFpEF, constrictive pericarditis, chronic A-fib, CKD stage IV, chronic lymphedema sent from nursing home for evaluation of severe hypokalemia.  Patient came in with increased weight gain of about 15 pounds in last few days. She is significantly edema and lower extremity more on the right. Denies any shortness of breath however she has been not much active given her left ankle fracture.  Chest x-ray-- Showing pulmonary congestion with small bilateral pleural effusions,   Severe hypokalemia -Likely secondary to diuresis - IV and p.o. replacement - Rate check potassium level tonight - Magnesium  level= 2.0, will check phosphorus level --K repleted   Acute on chronic HFpEF decompensation/Constrictive pericarditis History of mild to moderate aortic stenosis History of moderate MR --Diuretic resistance - With symptoms signs of fluid overload - Change p.o. diuresis to IV Lasix  40 mg twice dailyx 2 doses - Monitor potassium level - Hold off metolazone  -- cardiology consultation with [facility] cardiology Dr. Darron --change to po torsemide  with worsening creat --pt has issue with diuretic resistance   Chronic A-fib - Rate controlled, will use as needed metoprolol  if needed - Not on anticoagulation, used to be on Eliquis  which was discontinued in June probably because of newly developed iron  deficiency anemia. - Continue aspirin    CKD stage IV - Volume overloaded, creatinine level stable - change to po lasix    Chronic hypotension - BP stable, continue midodrine    IIDM - SSI   Stage II pressure ulcer, POA - Developed over the last few weeks due to immobility - Status post 2 weeks of IV antibiotics recently. - Outpatient wound care center follow-up    H/o Nondisplaced Left lateral malleolus fracture of fibula in Sept 2025 S/p left LE splint --PT to see pt --pt follows with Dr Lennie at [facility]  Overall stable and ok to d/c back to facility in am Overall poor long term prognosis Pt took herself OFF Hospice service since she wants to cont seeing her MD's as out pt  Procedures: Family communication :husband Consults :[facility] cardilogy CODE STATUS: full DVT Prophylaxis :heparin  Level of care: Telemetry will require IV Lasix  for diuresis due to volume overload.   Pt will d/c back to her facility in am. TOC notified  TOTAL TIME TAKING CARE OF THIS PATIENT: .  >50% time spent on counselling and coordination of care  Note: This dictation was prepared with Dragon dictation along with smaller phrase technology. Any transcriptional errors that result from this process are unintentional.  [name] M.D    [facility]   CC: Primary care physician; Vicci Duwaine SQUIBB, DO

## 2024-08-22 NOTE — [place] FL2 (Signed)
 [place]  MEDICAID FL2 LEVEL OF CARE FORM     IDENTIFICATION  Patient Name: Karen Farley Birthdate: 1946/04/04 Sex: female Admission Date (Current Location): 08/19/2024  [place] and [place] Number:  Chiropodist and Address:  [facility], [address], [place], [place] [address]      Provider Number: [id number]  Attending Physician Name and Address:  Tobie Calix, MD  Relative Name and Phone Number:  [name], [name] (Spouse)  [phone]    Current Level of Care: Hospital Recommended Level of Care: Skilled Nursing Facility Prior Approval Number:    Date Approved/Denied:   PASRR Number: [id number]  Discharge Plan: SNF    Current Diagnoses: Patient Active Problem List   Diagnosis Date Noted   Hypokalemia 08/19/2024   Nondisplaced fracture of lateral malleolus of left fibula, initial encounter for closed fracture 06/26/2024   Chronic diastolic CHF (congestive heart failure) (HCC) 06/26/2024   Unable to ambulate 06/25/2024   Ankle fracture 06/25/2024   Hypotension 03/20/2024   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/20/2024   Acute on chronic diastolic heart failure (HCC) 03/19/2024   HTN (hypertension) 03/19/2024   Acute renal failure superimposed on stage 3b chronic kidney disease (HCC) 03/19/2024   Overweight (BMI 25.0-29.9) 03/19/2024   Myocardial injury 03/19/2024   Atrial fibrillation, chronic (HCC) 03/19/2024   Acute idiopathic gout involving toe 09/30/2023   Senile purpura 12/13/2022   Pulmonary hypertension, primary (HCC) 09/07/2021   Pericardial effusion 08/05/2021   Acute blood loss anemia 08/04/2021   Anasarca    Stage 3b chronic kidney disease (HCC) 05/17/2021   Pleural effusion 05/17/2021   Acute on chronic diastolic CHF (congestive heart failure) (HCC) 05/17/2021   Chronic anticoagulation 05/17/2021   Acute kidney injury superimposed on CKD 04/11/2021   Hyponatremia 04/11/2021   Acute on chronic heart  failure with preserved ejection fraction (HFpEF) (HCC) 04/11/2021   Chronic venous insufficiency 08/14/2020   PAD (peripheral artery disease) 08/14/2020   Bilateral primary osteoarthritis of knee 04/13/2020   Acquired spondylolisthesis 12/17/2019   Lumbar radiculopathy 12/17/2019   Atrial fibrillation (HCC) 11/20/2019   Closed fracture of radial styloid 08/06/2019   Displaced fracture of unspecified radial styloid process, initial encounter for closed fracture 08/06/2019   Controlled substance agreement signed 01/24/2018   Piriformis syndrome 07/01/2016   Sacroiliac joint dysfunction 01/09/2016   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 diabetes mellitus (HCC) 04/28/2015   Lymphedema 03/27/2015   Open-angle glaucoma, mild stage    Osteoarthritis of both knees    Type 2 diabetes mellitus with renal complication (HCC)    Osteopenia    Benign hypertensive renal disease    Hyperlipidemia    Degeneration of lumbosacral intervertebral disc     Orientation RESPIRATION BLADDER Height & Weight     Self, Time, Situation, Place  Normal Continent Weight: 140 lb 6.9 oz (63.7 kg) Height:  5' (152.4 cm)  BEHAVIORAL SYMPTOMS/MOOD NEUROLOGICAL BOWEL NUTRITION STATUS      Continent Diet (Diet heart healthy/carb modified Room service appropriate? Yes; Fluid consistency: Thin starting at 10/30 1611)  AMBULATORY STATUS COMMUNICATION OF NEEDS Skin   Limited Assist Verbally                         Personal Care Assistance Level of Assistance  Bathing, Feeding, Dressing Bathing Assistance: Limited assistance Feeding assistance: Limited assistance Dressing Assistance: Limited assistance     Functional Limitations Info  SPECIAL CARE FACTORS FREQUENCY  PT (By licensed PT), OT (By licensed OT)     PT Frequency: 2x OT Frequency: 2x            Contractures Contractures Info: Not present    Additional Factors Info  Code Status, Allergies Code Status Info: FULL Allergies Info: NKA            Current Medications (08/22/2024):  This is the current hospital active medication list Current Facility-Administered Medications  Medication Dose Route Frequency Provider Last Rate Last Admin   acetaminophen  (TYLENOL ) tablet 650 mg  650 mg Oral Q4H PRN Laurita Manor T, MD   650 mg at 08/21/24 0850   allopurinol  (ZYLOPRIM ) tablet 100 mg  100 mg Oral Daily Laurita Manor T, MD   100 mg at 08/22/24 0912   aspirin  EC tablet 81 mg  81 mg Oral Daily Laurita Manor T, MD   81 mg at 08/22/24 0911   bisacodyl  (DULCOLAX) EC tablet 5 mg  5 mg Oral Daily PRN Laurita Manor T, MD       empagliflozin  (JARDIANCE ) tablet 25 mg  25 mg Oral Daily Patel, Sona, MD   25 mg at 08/22/24 9078   heparin  injection 5,000 Units  5,000 Units Subcutaneous Q12H Laurita Manor T, MD   5,000 Units at 08/22/24 0912   insulin  aspart (novoLOG ) injection 0-9 Units  0-9 Units Subcutaneous TID WC Laurita Manor T, MD   2 Units at 08/21/24 1754   latanoprost  (XALATAN ) 0.005 % ophthalmic solution 1 drop  1 drop Both Eyes QHS Laurita Manor T, MD   1 drop at 08/21/24 2129   metoprolol  tartrate (LOPRESSOR ) injection 5 mg  5 mg Intravenous Q4H PRN Laurita Manor T, MD       midodrine  (PROAMATINE ) tablet 5 mg  5 mg Oral TID WC Patel, Sona, MD   5 mg at 08/22/24 0911   ondansetron  (ZOFRAN ) injection 4 mg  4 mg Intravenous Q6H PRN Laurita Manor T, MD       oxyCODONE  (Oxy IR/ROXICODONE ) immediate release tablet 5 mg  5 mg Oral Q6H PRN Laurita Manor T, MD   5 mg at 08/21/24 2031   pantoprazole  (PROTONIX ) EC tablet 40 mg  40 mg Oral BID Zhang, Ping T, MD   40 mg at 08/22/24 0911   polyethylene glycol (MIRALAX  / GLYCOLAX ) packet 17 g  17 g Oral BID Laurita Manor T, MD   17 g at 08/22/24 9092   potassium chloride  (KLOR-CON ) packet 40 mEq  40 mEq Oral BID Chappell, Alex B, RPH   40 mEq at 08/22/24 9092   sodium chloride  flush (NS) 0.9 % injection 3 mL  3 mL Intravenous Q12H Laurita Manor T, MD   3 mL at 08/21/24 2130   sodium chloride  flush (NS) 0.9 % injection 3 mL   3 mL Intravenous PRN Laurita Manor T, MD       spironolactone  (ALDACTONE ) tablet 12.5 mg  12.5 mg Oral Daily Patel, Sona, MD   12.5 mg at 08/22/24 0921   temazepam (RESTORIL) capsule 15 mg  15 mg Oral QHS PRN Laurita Manor DASEN, MD       torsemide  (DEMADEX ) tablet 60 mg  60 mg Oral Daily Patel, Sona, MD   60 mg at 08/22/24 0911     Discharge Medications: Please see discharge summary for a list of discharge medications.  Relevant Imaging Results:  Relevant Lab Results:   Additional Information [id number]  Gabriellia Rempel L [place], [place]

## 2024-08-23 DIAGNOSIS — E119 Type 2 diabetes mellitus without complications: Secondary | ICD-10-CM | POA: Diagnosis not present

## 2024-08-23 DIAGNOSIS — E876 Hypokalemia: Secondary | ICD-10-CM | POA: Diagnosis not present

## 2024-08-23 DIAGNOSIS — I959 Hypotension, unspecified: Secondary | ICD-10-CM | POA: Diagnosis not present

## 2024-08-23 DIAGNOSIS — I311 Chronic constrictive pericarditis: Secondary | ICD-10-CM | POA: Diagnosis not present

## 2024-08-23 DIAGNOSIS — K7460 Unspecified cirrhosis of liver: Secondary | ICD-10-CM | POA: Diagnosis not present

## 2024-08-23 DIAGNOSIS — R188 Other ascites: Secondary | ICD-10-CM | POA: Diagnosis not present

## 2024-08-23 DIAGNOSIS — L89154 Pressure ulcer of sacral region, stage 4: Secondary | ICD-10-CM | POA: Diagnosis not present

## 2024-08-23 DIAGNOSIS — L24A2 Irritant contact dermatitis due to fecal, urinary or dual incontinence: Secondary | ICD-10-CM | POA: Diagnosis not present

## 2024-08-23 DIAGNOSIS — I5020 Unspecified systolic (congestive) heart failure: Secondary | ICD-10-CM | POA: Diagnosis not present

## 2024-08-23 DIAGNOSIS — I5023 Acute on chronic systolic (congestive) heart failure: Secondary | ICD-10-CM | POA: Diagnosis not present

## 2024-08-23 DIAGNOSIS — Z7401 Bed confinement status: Secondary | ICD-10-CM | POA: Diagnosis not present

## 2024-08-23 DIAGNOSIS — N184 Chronic kidney disease, stage 4 (severe): Secondary | ICD-10-CM | POA: Diagnosis not present

## 2024-08-23 DIAGNOSIS — I5032 Chronic diastolic (congestive) heart failure: Secondary | ICD-10-CM

## 2024-08-23 DIAGNOSIS — L89153 Pressure ulcer of sacral region, stage 3: Secondary | ICD-10-CM | POA: Diagnosis not present

## 2024-08-23 DIAGNOSIS — E871 Hypo-osmolality and hyponatremia: Secondary | ICD-10-CM | POA: Diagnosis not present

## 2024-08-23 DIAGNOSIS — M25572 Pain in left ankle and joints of left foot: Secondary | ICD-10-CM | POA: Diagnosis not present

## 2024-08-23 DIAGNOSIS — I5033 Acute on chronic diastolic (congestive) heart failure: Secondary | ICD-10-CM | POA: Diagnosis not present

## 2024-08-23 DIAGNOSIS — R0602 Shortness of breath: Secondary | ICD-10-CM | POA: Diagnosis not present

## 2024-08-23 DIAGNOSIS — R5381 Other malaise: Secondary | ICD-10-CM | POA: Diagnosis not present

## 2024-08-23 DIAGNOSIS — S8265XD Nondisplaced fracture of lateral malleolus of left fibula, subsequent encounter for closed fracture with routine healing: Secondary | ICD-10-CM | POA: Diagnosis not present

## 2024-08-23 DIAGNOSIS — I5022 Chronic systolic (congestive) heart failure: Secondary | ICD-10-CM | POA: Diagnosis not present

## 2024-08-23 LAB — GLUCOSE, CAPILLARY
Glucose-Capillary: 113 mg/dL — ABNORMAL HIGH (ref 70–99)
Glucose-Capillary: 121 mg/dL — ABNORMAL HIGH (ref 70–99)
Glucose-Capillary: 159 mg/dL — ABNORMAL HIGH (ref 70–99)
Glucose-Capillary: 124 mg/dL — ABNORMAL HIGH (ref 70–99)

## 2024-08-23 LAB — BASIC METABOLIC PANEL WITH GFR
Anion gap: 18 — ABNORMAL HIGH (ref 5–15)
BUN: 74 mg/dL — ABNORMAL HIGH (ref 8–23)
CO2: 25 mmol/L (ref 22–32)
Calcium: 9.3 mg/dL (ref 8.9–10.3)
Chloride: 89 mmol/L — ABNORMAL LOW (ref 98–111)
Creatinine, Ser: 2.47 mg/dL — ABNORMAL HIGH (ref 0.44–1.00)
GFR, Estimated: 19 mL/min — ABNORMAL LOW (ref >60)
Glucose, Bld: 128 mg/dL — ABNORMAL HIGH (ref 70–99)
Potassium: 3.8 mmol/L (ref 3.5–5.1)
Sodium: 132 mmol/L — ABNORMAL LOW (ref 135–145)

## 2024-08-23 MED ORDER — MIDODRINE HCL 5 MG PO TABS
10.0000 mg | ORAL_TABLET | Freq: Once | ORAL | Status: AC
  Administered 2024-08-23: 10 mg via ORAL
  Filled 2024-08-23: qty 2

## 2024-08-23 MED ORDER — METOLAZONE 5 MG PO TABS: 5.0000 mg | ORAL_TABLET | ORAL | 0 refills | Status: AC

## 2024-08-23 MED ORDER — SODIUM CHLORIDE 0.9 % IV BOLUS
500.0000 mL | Freq: Once | INTRAVENOUS | Status: AC
  Administered 2024-08-23: 500 mL via INTRAVENOUS

## 2024-08-23 MED ORDER — MIDODRINE HCL 5 MG PO TABS: 5.0000 mg | ORAL_TABLET | Freq: Three times a day (TID) | ORAL | 0 refills | Status: AC

## 2024-08-23 MED ORDER — SODIUM CHLORIDE 0.9 % IV BOLUS: 1000.0000 mL | Freq: Once | INTRAVENOUS | Status: DC

## 2024-08-23 MED ORDER — TORSEMIDE 60 MG PO TABS: ORAL_TABLET | ORAL | 0 refills | Status: AC

## 2024-08-23 MED ORDER — OXYCODONE HCL 5 MG PO TABS: 5.0000 mg | ORAL_TABLET | Freq: Four times a day (QID) | ORAL | 0 refills | Status: AC | PRN

## 2024-08-23 NOTE — Progress Notes (Signed)
 Patient has had three large urine occurrences due to incontinence. Output no measurable.

## 2024-08-23 NOTE — Progress Notes (Signed)
 Physical Therapy Treatment Patient Details Name: Karen Farley MRN: [id number] DOB: [birth year] Today's Date: 08/23/2024   History of Present Illness Pt is a 78 y/o F admitted on 08/19/24 after presenting from nursing home for evaluation of severe hypokalemia. Pt with recent admission 7 weeks ago for L ankle fx, non operable, placed in cam boot.  PMH: DM2, HTN, orthostatic hypotension, HLD, chronic HFpEF, constrictive pericarditis, chronic a-fib, CKD IV, chronic lymphedema    PT Comments  Pt received in bed with CAM boot already donned. Pt agreeable to PT session. Pt performs multiple STS during session with minA and cuing for hand placement. Pt performs step pivot transfer from bed>BSC with min A to prevent LOB. Pt able to amb 6 ft using RW and chair follow and continues to demonstrate short steps and dec weight shift to L side. SPT provided assistance for weight shift. Will continue to follow pt acutely to progress mobility and promote safety/independence.   If plan is discharge home, recommend the following: A little help with walking and/or transfers;A little help with bathing/dressing/bathroom;Help with stairs or ramp for entrance;Assistance with cooking/housework;Assist for transportation   Can travel by private vehicle     Yes  Equipment Recommendations  Other (comment) (TBD at next venue)    Recommendations for Other Services       Precautions / Restrictions Precautions Precautions: Fall Recall of Precautions/Restrictions: Intact Required Braces or Orthoses: Other Brace Other Brace: CAM boot LLE Restrictions Weight Bearing Restrictions Per Provider Order: Yes LLE Weight Bearing Per Provider Order: Weight bearing as tolerated     Mobility  Bed Mobility Overal bed mobility: Needs Assistance Bed Mobility: Supine to Sit     Supine to sit: Min assist     General bed mobility comments: HOB elevated and use of bed rails. min A for trunk management.     Transfers Overall transfer level: Needs assistance Equipment used: Rolling walker (2 wheels) Transfers: Sit to/from Stand, Bed to chair/wheelchair/BSC Sit to Stand: Min assist   Step pivot transfers: Min assist       General transfer comment: STS from EOB. Min A for lift. Min A for step-pivot transfer to [facility] for RW mangement and to prevent LOB. Cuing for sequencing and hand placement.    Ambulation/Gait Ambulation/Gait assistance: Min assist, +2 safety/equipment (chair follow) Gait Distance (Feet): 6 Feet Assistive device: Rolling walker (2 wheels) Gait Pattern/deviations: Decreased step length - right, Decreased step length - left, Decreased stride length, Decreased weight shift to left, Decreased dorsiflexion - left       General Gait Details: min A for weight shift and to maintain balance. cuing for sequencing and posture. Pt has tendency to maintain forward flexion thoughout amb   Stairs             Wheelchair Mobility     Tilt Bed    Modified Rankin (Stroke Patients Only)       Balance Overall balance assessment: Needs assistance Sitting-balance support: Feet supported Sitting balance-Leahy Scale: Good Sitting balance - Comments: able to maintain balance at EOB and on BSC   Standing balance support: During functional activity, Bilateral upper extremity supported, Reliant on assistive device for balance Standing balance-Leahy Scale: Fair Standing balance comment: min A-CGA to maintain balance                            Communication Communication Communication: No apparent difficulties  Cognition Arousal: Alert Behavior During Therapy: [facility]  for tasks assessed/performed   PT - Cognitive impairments: No apparent impairments                       PT - Cognition Comments: pleasant and agreeable to PT Following commands: Intact      Cueing Cueing Techniques: Verbal cues  Exercises      General Comments        Pertinent  Vitals/Pain Pain Assessment Pain Assessment: Faces Pain Location: sacrum Pain Descriptors / Indicators: Discomfort Pain Intervention(s): Monitored during session    Home Living                          Prior Function            PT Goals (current goals can now be found in the care plan section) Acute Rehab PT Goals PT Goal Formulation: With patient Time For Goal Achievement: 09/04/24 Potential to Achieve Goals: Good Progress towards PT goals: Progressing toward goals    Frequency    Min 2X/week      PT Plan      Co-evaluation              AM-PAC PT 6 Clicks Mobility   Outcome Measure  Help needed turning from your back to your side while in a flat bed without using bedrails?: A Little Help needed moving from lying on your back to sitting on the side of a flat bed without using bedrails?: A Little Help needed moving to and from a bed to a chair (including a wheelchair)?: A Little Help needed standing up from a chair using your arms (e.g., wheelchair or bedside chair)?: A Little Help needed to walk in hospital room?: A Little Help needed climbing 3-5 steps with a railing? : Total 6 Click Score: 16    End of Session Equipment Utilized During Treatment: Gait belt Activity Tolerance: Patient tolerated treatment well Patient left: in chair;with call bell/phone within reach;with chair alarm set;with family/visitor present (pressure relief pillow in chair.) Nurse Communication: Mobility status PT Visit Diagnosis: Unsteadiness on feet (R26.81);Muscle weakness (generalized) (M62.81);Difficulty in walking, not elsewhere classified (R26.2);Pain     Time: 1140-1205 PT Time Calculation (min) (ACUTE ONLY): 25 min  Charges:                            Ceaira Ernster, SPT    Aanya Haynes 08/23/2024, 12:41 PM

## 2024-08-23 NOTE — Progress Notes (Addendum)
 Patient is prepared for discharge per provider orders. Discharge instructions reviewed with patient and nurse at [facility], including medicaitons, follow-up appointments, diet and activity. IV access removed with tip intact. Vital signs measured and documented. No acute signs of distress noted. Personal belongings gathered and accounted for. Patient waiting on ambulance transport.

## 2024-08-23 NOTE — TOC Transition Note (Signed)
 Transition of Care [facility]) - Discharge Note   Patient Details  Name: Karen Farley MRN: [id number] Date of Birth: [birth year]  [facility]) CM/SW Contact:  Lauraine JAYSON Carpen, LCSW Phone Number: 08/23/2024, 2:27 PM   Clinical Narrative:  Patient has orders to discharge back to [facility] today. RN will call report to [phone] (Room 612B). [facility] has been arranged and she is 5th on the list. No further concerns. CSW signing off.   Final next level of care: Skilled Nursing Facility Barriers to Discharge: Barriers Resolved   Patient Goals and CMS Choice     Choice offered to / list presented to : Patient, Spouse      Discharge Placement   Existing PASRR number confirmed : 08/22/24          Patient chooses bed at: [facility] [place] Patient to be transferred to facility by: [facility] Name of family member notified: Pollyann Roa Patient and family notified of of transfer: 08/23/24  Discharge Plan and Services Additional resources added to the After Visit Summary for       Post Acute Care Choice: Resumption of Svcs/PTA Provider                               Social Drivers of Health (SDOH) Interventions SDOH Screenings   Food Insecurity: No Food Insecurity (08/19/2024)  Housing: Low Risk  (08/19/2024)  Transportation Needs: No Transportation Needs (08/19/2024)  Utilities: Not At Risk (08/19/2024)  Alcohol Screen: Low Risk  (07/23/2022)  Depression (PHQ2-9): Low Risk  (12/19/2023)  Financial Resource Strain: Low Risk  (07/09/2024)   Received from [facility]  Physical Activity: Sufficiently Active (07/23/2022)  Social Connections: Unknown (08/19/2024)  Recent Concern: Social Connections - Moderately Isolated (06/25/2024)  Stress: No Stress Concern Present (07/23/2022)  Tobacco Use: Low Risk  (08/19/2024)     Readmission Risk Interventions     No data to display

## 2024-08-23 NOTE — Progress Notes (Signed)
 MD made aware of patient's BP. Patient is asymptomatic and MAP is 71. No new orders at this time.

## 2024-08-23 NOTE — Plan of Care (Signed)
  Problem: Health Behavior/Discharge Planning: Goal: Ability to identify and utilize available resources and services will improve Outcome: Progressing Goal: Ability to manage health-related needs will improve Outcome: Progressing   Problem: Metabolic: Goal: Ability to maintain appropriate glucose levels will improve Outcome: Progressing   Problem: Skin Integrity: Goal: Risk for impaired skin integrity will decrease Outcome: Progressing   Problem: Tissue Perfusion: Goal: Adequacy of tissue perfusion will improve Outcome: Progressing   Problem: Education: Goal: Knowledge of General Education information will improve Description: Including pain rating scale, medication(s)/side effects and non-pharmacologic comfort measures Outcome: Progressing   Problem: Clinical Measurements: Goal: Will remain free from infection Outcome: Progressing Goal: Cardiovascular complication will be avoided Outcome: Progressing

## 2024-08-23 NOTE — Progress Notes (Signed)
 Rounding Note   Patient Name: Karen Farley Date of Encounter: 08/23/2024  [facility] Cardiologist: Evalene Lunger, MD   Subjective She denies any chest pain but she is not very interactive this morning.  Shortness of breath and lower extremity edema improved. Scheduled Meds:  allopurinol   100 mg Oral Daily   aspirin  EC  81 mg Oral Daily   empagliflozin   25 mg Oral Daily   heparin   5,000 Units Subcutaneous Q12H   insulin  aspart  0-9 Units Subcutaneous TID WC   latanoprost   1 drop Both Eyes QHS   metolazone   5 mg Oral QODAY   midodrine   5 mg Oral TID WC   pantoprazole   40 mg Oral BID   polyethylene glycol  17 g Oral BID   sodium chloride  flush  3 mL Intravenous Q12H   spironolactone   12.5 mg Oral Daily   torsemide   60 mg Oral Daily   Continuous Infusions:  PRN Meds: acetaminophen , bisacodyl , metoprolol  tartrate, ondansetron  (ZOFRAN ) IV, oxyCODONE , sodium chloride  flush, temazepam   Vital Signs  Vitals:   08/23/24 0416 08/23/24 0424 08/23/24 0847 08/23/24 1131  BP: 100/67  105/65 (!) 82/64  Pulse: 89  86 95  Resp: 17     Temp: 98.3 F (36.8 C)  (!) 97.5 F (36.4 C) (!) 97.4 F (36.3 C)  TempSrc:   Oral   SpO2: 98%  95% 97%  Weight:  61.2 kg    Height:        Intake/Output Summary (Last 24 hours) at 08/23/2024 1343 Last data filed at 08/22/2024 2200 Gross per 24 hour  Intake 360 ml  Output --  Net 360 ml      08/23/2024    4:24 AM 08/22/2024    5:00 AM 08/21/2024    5:00 AM  Last 3 Weights  Weight (lbs) 134 lb 14.7 oz 140 lb 6.9 oz 140 lb 3.4 oz  Weight (kg) 61.2 kg 63.7 kg 63.6 kg      Telemetry Rate controlled atrial fibrillation- Personally Reviewed  ECG  No new tracings- Personally Reviewed  Physical Exam  GEN: No acute distress.   Neck: No JVD Cardiac: IR IR, II/VI systolic murmur RUSB, without rubs or gallops.  Respiratory: Clear with diminished bases to auscultation bilaterally.  Respirations are unlabored at rest on room  air GI: Soft, nontender, non-distended  MS: 1+ edema right lower extremity, left lower extremity is in a walking boot; No deformity. Neuro:  Nonfocal  Psych: Normal affect   Labs High Sensitivity Troponin:  No results for input(s): TROPONINIHS in the last 720 hours.   Chemistry Recent Labs  Lab 08/19/24 1212 08/19/24 2015 08/21/24 0443 08/22/24 0530 08/23/24 0208  NA 134*   < > 130* 135 132*  K 2.6*   < > 2.9* 3.1* 3.8  CL 88*   < > 86* 90* 89*  CO2 28   < > 24 26 25   GLUCOSE 138*   < > 121* 119* 128*  BUN 72*   < > 76* 74* 74*  CREATININE 2.56*   < > 2.72* 2.46* 2.47*  CALCIUM  9.4   < > 9.2 9.5 9.3  MG 2.0  --  2.1 2.1  --   PROT 7.0  --   --   --   --   ALBUMIN  3.3*  --   --   --   --   AST 29  --   --   --   --  ALT 10  --   --   --   --   ALKPHOS 137*  --   --   --   --   BILITOT 2.1*  --   --   --   --   GFRNONAA 19*   < > 17* 20* 19*  ANIONGAP 18*   < > 20* 19* 18*   < > = values in this interval not displayed.    Lipids No results for input(s): CHOL, TRIG, HDL, LABVLDL, LDLCALC, CHOLHDL in the last 168 hours.  Hematology Recent Labs  Lab 08/19/24 1212  WBC 6.3  RBC 5.90*  HGB 14.3  HCT 45.3  MCV 76.8*  MCH 24.2*  MCHC 31.6  RDW 22.9*  PLT 293   Thyroid  No results for input(s): TSH, FREET4 in the last 168 hours.  BNP Recent Labs  Lab 08/19/24 1212  BNP 242.5*    DDimer No results for input(s): DDIMER in the last 168 hours.   Radiology    Cardiac Studies Limited 2d echo 08/21/2024 1. Left ventricular ejection fraction, by estimation, is 60 to 65%. The  left ventricle has normal function. There is moderate concentric left  ventricular hypertrophy. Left ventricular diastolic function could not be  evaluated.   2. Right ventricular systolic function was not well visualized. The right  ventricular size is normal.   3. Left atrial size was moderately to severely dilated.   4. Right atrial size was moderately dilated.   5.  There is no evidence of cardiac tamponade.   6. The mitral valve is abnormal. Mild mitral valve regurgitation. Mild  mitral stenosis. Severe mitral annular calcification.   7. The aortic valve is calcified. There is severe calcifcation of the  aortic valve. There is severe thickening of the aortic valve. Mild aortic  valve stenosis. Aortic valve mean gradient measures 13.5 mmHg.   8. The inferior vena cava is normal in size with <50% respiratory  variability, suggesting right atrial pressure of 8 mmHg.   2d echo 03/20/2024 1. Intracavitary gradient. Peak velocity 2.31 m/s. Peak gradient 21.3  mmHg. Left ventricular ejection fraction, by estimation, is 60 to 65%. The  left ventricle has normal function. The left ventricle has no regional  wall motion abnormalities. There is  moderate concentric left ventricular hypertrophy.   2. Right ventricular systolic function is normal. The right ventricular  size is normal. There is normal pulmonary artery systolic pressure.   3. Mostly lateral to the RV. There is also echogenic material in the  pericardium lateral to the RV apex. This is visualized on prior echo as  well. May represent fat pad. However, it may also represent evidence of  prior hemopericardium. a small  pericardial effusion is present. There is no evidence of cardiac  tamponade.   4. Mobile MAC on the posterior mitral valve annulus. The mitral valve is  normal in structure. Mild mitral valve regurgitation. Mild mitral  stenosis. The mean mitral valve gradient is 3.5 mmHg with average heart  rate of 79 bpm. Severe mitral annular  calcification.   5. The aortic valve is normal in structure. There is severe calcifcation  of the aortic valve. There is severe thickening of the aortic valve.  Aortic valve regurgitation is not visualized. Mild aortic valve stenosis.  Aortic valve area, by VTI measures  1.10 cm. Aortic valve mean gradient measures 13.8 mmHg. Aortic valve Vmax  measures  2.57 m/s.   6. The inferior vena cava is normal in  size with greater than 50%  respiratory variability, suggesting right atrial pressure of 3 mmHg.    Echo limited 10/2021 1. Left ventricular ejection fraction, by estimation, is 60 to 65%. The  left ventricle has normal function. The left ventricle has no regional  wall motion abnormalities. There is mild left ventricular hypertrophy.  Left ventricular diastolic parameters  are indeterminate.   2. Right ventricular systolic function is normal. The right ventricular  size is normal.   3. A small to moderate sized pericardial effusion is present, 1.1 Cm  circumferential, pocket of 2.5 cm off the LV free wall. No tamponade  present.   4. The mitral valve is normal in structure. Moderate mitral valve  regurgitation. No evidence of mitral stenosis. Moderate mitral annular  calcification.   5. Aortic valve has moderate calcification of the aortic valve. Aortic  valve regurgitation is not visualized.Degree of stenosis not measured.   6. The inferior vena cava is normal in size with greater than 50%  respiratory variability, suggesting right atrial pressure of 3 mmHg.    Comparison(s): Moderate pericardial effusion. Largest 1.9 cm LV free wall,  Moderate MR.    Limited Echo 08/2021  1. Left pleural effusion noted, 5 cm   2. Moderate pericardial effusion, estimated 1.9 off the LV free wall,  minimal off the RV free wall (0.4 cm) . There is no evidence of cardiac  tamponade.   3. Left ventricular ejection fraction, by estimation, is 60 to 65%. The  left ventricle has normal function. The left ventricle has no regional  wall motion abnormalities. There is mild left ventricular hypertrophy.   4. Right ventricular systolic function is normal. The right ventricular  size is normal.   5. Left atrial size was severely dilated.   6. The mitral valve is normal in structure. Moderate mitral valve  regurgitation. No evidence of mitral stenosis.    7. The aortic valve is normal in structure. Aortic valve regurgitation is  not visualized. Mild to moderate aortic valve sclerosis/calcification is  present, without any evidence of aortic stenosis.   Patient Profile   78 y.o. 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  Hypotension - Stable on midodrine  5 mg 3 times daily.  Permanent atrial fibrillation -Continues to remain rate controlled -No longer on apixaban  due to iron  deficiency anemia -Continue aspirin  81 mg daily  CKD stage IV: Stable. 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  Chronic lymphedema -Edema has improved with elevation - Change IV back to oral diuretic therapy  Sacral decubitus stage II, POA -Previously been on 2 weeks worth of IV antibiotics -Outpatient wound center follow-up -Ongoing management per IM  The patient  is being discharged [facility].  High risk for readmission and overall prognosis is poor.    For questions or updates, please contact [facility] Please consult [email or web address] for contact info under       Signed, Deatrice Cage, MD  08/23/2024, 1:43 PM

## 2024-08-23 NOTE — Discharge Summary (Signed)
 Physician Discharge Summary   Patient: Karen Farley MRN: [id number] DOB: 07/24/1946  Admit date:     08/19/2024  Discharge date: 08/23/24  Discharge Physician: [name]   PCP: Vicci Duwaine SQUIBB, DO   Recommendations at discharge:   F/u [facility] cardiology Dr Zenaida in 1 week F/u PCP in 1-2 weeks F/u Dr Dennise for CKD-IV in 1-2 weeks F/u Dr Lennie Podiatry for left ankle fracture on your appt  Discharge Diagnoses: Principal Problem:   Hypokalemia Active Problems:   Cirrhosis of liver with ascites (HCC)   Chronic diastolic CHF (congestive heart failure) (HCC)   Biventricular heart failure (HCC)   Chronic constrictive pericarditis  Karen Farley is a 78 y.o. female with medical history significant of IIDM, HTN, orthostatic hypotension on midodrine , HLD, chronic HFpEF, constrictive pericarditis, chronic A-fib, CKD stage IV, chronic lymphedema sent from nursing home for evaluation of severe hypokalemia.  Patient came in with increased weight gain of about 15 pounds in last few days. She is significantly edema and lower extremity more on the right. Denies any shortness of breath however she has been not much active given her left ankle fracture.   Chest x-ray-- Showing pulmonary congestion with small bilateral pleural effusions,    Severe hypokalemia -Likely secondary to diuresis - IV and p.o. replacement - Magnesium  level= 2.0, will check phosphorus level --K repleted   Acute on chronic HFpEF decompensation/Constrictive pericarditis History of mild to moderate aortic stenosis History of moderate MR Diuretic resistance - With symptoms signs of fluid overload - Change p.o. diuresis to IV Lasix  40 mg twice dailyx 2 doses--now on po torsemide  60 mg qd - resumed metolazone  qod -- cardiology consultation with [facility] cardiology Dr. Darron --pt has issue with diuretic resistance   Chronic A-fib - Rate controlled, will use as needed metoprolol  if needed - Not on anticoagulation,  used to be on Eliquis  which was discontinued in June probably because of newly developed iron  deficiency anemia. - Continue aspirin    CKD stage IV - Volume overloaded, creatinine level stable - change to po lasix  --pt follows with Dr Dennise   Chronic hypotension - BP stable, continue midodrine    IIDM - SSI   Stage II pressure ulcer, POA - Developed over the last few weeks due to immobility - Status post 2 weeks of IV antibiotics recently. - Outpatient wound care center follow-up   H/o Nondisplaced Left lateral malleolus fracture of fibula in Sept 2025 S/p left LE splint --PT to see pt --pt follows with Dr Lennie at [facility]   Overall stable and ok to d/c back to facility today. D/w Dr darron Overall poor long term prognosis Pt took herself OFF Hospice service since she wants to cont seeing her MD's as out pt   Procedures: Family communication :none today Consults :[facility] CODE STATUS: full DVT Prophylaxis :heparin      Pain control - [place]  Controlled Substance Reporting System database was reviewed. and patient was instructed, not to drive, operate heavy machinery, perform activities at heights, swimming or participation in water activities or provide baby-sitting services while on Pain, Sleep and Anxiety Medications; until their outpatient Physician has advised to do so again. Also recommended to not to take more than prescribed Pain, Sleep and Anxiety Medications.  Disposition: Rehabilitation facility Diet recommendation:  Discharge Diet Orders (From admission, onward)     Start     Ordered   08/23/24 0000  Diet - low sodium heart healthy  08/23/24 0819           Cardiac and Carb modified diet DISCHARGE MEDICATION: Allergies as of 08/23/2024   No Known Allergies      Medication List     STOP taking these medications    bisacodyl  5 MG EC tablet Commonly known as: DULCOLAX   Lantus  SoloStar 100 UNIT/ML Solostar Pen Generic drug: insulin   glargine   metFORMIN  1000 MG tablet Commonly known as: GLUCOPHAGE    traMADol  50 MG tablet Commonly known as: ULTRAM        TAKE these medications    acetaminophen  325 MG tablet Commonly known as: TYLENOL  Take 2 tablets (650 mg total) by mouth every 6 (six) hours as needed for mild pain (pain score 1-3) or fever (or Fever >/= 101).   allopurinol  100 MG tablet Commonly known as: ZYLOPRIM  Take 1 tablet (100 mg total) by mouth daily.   aspirin  EC 81 MG tablet Take 1 tablet (81 mg total) by mouth daily. Swallow whole.   empagliflozin  25 MG Tabs tablet Commonly known as: JARDIANCE  Take 1 tablet (25 mg total) by mouth daily.   latanoprost  0.005 % ophthalmic solution Commonly known as: XALATAN  Place 1 drop into both eyes at bedtime.   metolazone  5 MG tablet Commonly known as: ZAROXOLYN  Take 1 tablet (5 mg total) by mouth every other day. What changed: when to take this   midodrine  5 MG tablet Commonly known as: PROAMATINE  Take 1 tablet (5 mg total) by mouth 3 (three) times daily with meals. What changed:  medication strength how much to take when to take this additional instructions   naloxone 4 MG/0.1ML Liqd nasal spray kit Commonly known as: NARCAN Place 0.4 mg into the nose once as needed (SINGNS, SYMPTOMS OF OPIOIDS).   oxyCODONE  5 MG immediate release tablet Commonly known as: Oxy IR/ROXICODONE  Take 1 tablet (5 mg total) by mouth every 6 (six) hours as needed for severe pain (pain score 7-10) or breakthrough pain.   pantoprazole  40 MG tablet Commonly known as: PROTONIX  Take 1 tablet (40 mg total) by mouth 2 (two) times daily.   polyethylene glycol 17 g packet Commonly known as: MIRALAX  / GLYCOLAX  Take 17 g by mouth 2 (two) times daily.   potassium chloride  SA 20 MEQ tablet Commonly known as: KLOR-CON  M Take 40 mEq by mouth daily.   spironolactone  25 MG tablet Commonly known as: ALDACTONE  Take 12.5 mg by mouth daily. What changed: Another medication  with the same name was removed. Continue taking this medication, and follow the directions you see here.   Torsemide  60 MG Tabs 2 tablets by mouth daily What changed: medication strength               Discharge Care Instructions  (From admission, onward)           Start     Ordered   08/23/24 0000  Discharge wound care:       Comments: 08/22/24 0500    Wound care  Daily      Comments: Cleanse with Vashe, pack with small 2x2 moist with Vashe Soila # [id number]), top with dry dressing. Change daily  08/21/24 1227   08/23/24 0819            Follow-up Information     [facility]. Go on 08/31/2024.   Specialty: Cardiology Why: Hospital Follow-Up 08/31/2024 @ 8:30 AM Please bring all medications with you to appointment [facility], [address]  Free Valet Parking at the Advertising Account Planner information: [address]  [place]  [address] [phone]        Vicci Duwaine SQUIBB, DO. Schedule an appointment as soon as possible for a visit in 1 week(s).   Specialty: Family Medicine Contact information: [address] [place] [address] [phone]         Lennie Barter, DPM. Go to.   Specialty: Podiatry Why: on your scheduled appt Contact information: [address] [place] [address] [phone]         Zenaida Morene PARAS, MD. Schedule an appointment as soon as possible for a visit in 1 week(s).   Specialty: Cardiology Why: for CHF/constricitve pericarditis Contact information: [address] [place] [address] [phone]         Dennise Capri, MD. Schedule an appointment as soon as possible for a visit in 10 day(s).   Specialty: Nephrology Why: CKD IV Contact information: [address] [place] [address] [phone]                Discharge Exam: Karen Farley   08/21/24 0500 08/22/24 0500 08/23/24 0424   Weight: 63.6 kg 63.7 kg 61.2 kg  GENERAL:  78 y.o.-year-old patient with no acute distress.  LUNGS: Normal breath sounds bilaterally, no wheezing CARDIOVASCULAR: S1, S2 normal. No murmur   ABDOMEN: Soft, nontender, nondistended. Bowel sounds present.  EXTREMITIES: left lower extremity boot+ Right LE improving edema NEUROLOGIC: nonfocal  patient is alert and awake SKIN: Wound 08/19/24 1700 Pressure Injury Sacrum Medial Stage 3 -  Full thickness tissue loss. Subcutaneous fat may be visible but bone, tendon or muscle are NOT exposed. (Active)        Condition at discharge: fair  The results of significant diagnostics from this hospitalization (including imaging, microbiology, ancillary and laboratory) are listed below for reference.   Imaging Studies: ECHOCARDIOGRAM LIMITED Result Date: 08/21/2024    ECHOCARDIOGRAM LIMITED REPORT   Patient Name:   Karen Farley Date of Exam: 08/21/2024 Medical Rec #:  [id number]          Height:       60.0 in Accession #:    [id number]         Weight:       140.2 lb Date of Birth:  1946-09-14           BSA:          1.605 m Patient Age:    78 years           BP:           94/74 mmHg Patient Gender: F                  HR:           92 bpm. Exam Location:  [facility] Procedure: Limited Echo and Limited Color Doppler (Both Spectral and Color Flow            Doppler were utilized during procedure). Indications:     Pericardial effusion I31.3  History:         Patient has prior history of Echocardiogram examinations, most                  recent 03/23/2024.  Sonographer:     Thedora Vicci RDCS, FASE Referring Phys:  [id number] SHERI HAMMOCK Diagnosing Phys: Shelda Bruckner MD  Sonographer Comments: Technically difficult study due to poor echo windows. Image acquisition challenging due  to patient body habitus. IMPRESSIONS  1. Left ventricular ejection fraction, by estimation, is 60 to 65%. The left ventricle has normal function. There is moderate concentric left  ventricular hypertrophy. Left ventricular diastolic function could not be evaluated.  2. Right ventricular systolic function was not well visualized. The right ventricular size is normal.  3. Left atrial size was moderately to severely dilated.  4. Right atrial size was moderately dilated.  5. There is no evidence of cardiac tamponade.  6. The mitral valve is abnormal. Mild mitral valve regurgitation. Mild mitral stenosis. Severe mitral annular calcification.  7. The aortic valve is calcified. There is severe calcifcation of the aortic valve. There is severe thickening of the aortic valve. Mild aortic valve stenosis. Aortic valve mean gradient measures 13.5 mmHg.  8. The inferior vena cava is normal in size with <50% respiratory variability, suggesting right atrial pressure of 8 mmHg. Comparison(s): No significant change from prior study. FINDINGS  Left Ventricle: Left ventricular ejection fraction, by estimation, is 60 to 65%. The left ventricle has normal function. There is moderate concentric left ventricular hypertrophy. Left ventricular diastolic function could not be evaluated. Right Ventricle: The right ventricular size is normal. Right vetricular wall thickness was not well visualized. Right ventricular systolic function was not well visualized. Left Atrium: Left atrial size was moderately to severely dilated. Right Atrium: Right atrial size was moderately dilated. Pericardium: Trivial pericardial effusion is present. There is no evidence of cardiac tamponade. Thickening/calcification of pericardium present. Mitral Valve: The mitral valve is abnormal. There is moderate thickening of the mitral valve leaflet(s). There is moderate calcification of the mitral valve leaflet(s). Severe mitral annular calcification. Mild mitral valve regurgitation. Mild mitral valve stenosis. MV peak gradient, 12.5 mmHg. The mean mitral valve gradient is 3.0 mmHg. Aortic Valve: The aortic valve is calcified. There is severe  calcifcation of the aortic valve. There is severe thickening of the aortic valve. Mild aortic stenosis is present. Aortic valve mean gradient measures 13.5 mmHg. Aortic valve peak gradient measures 26.1 mmHg. Venous: The inferior vena cava is normal in size with less than 50% respiratory variability, suggesting right atrial pressure of 8 mmHg. IAS/Shunts: The atrial septum is grossly normal. LEFT VENTRICLE PLAX 2D LVIDd:         3.10 cm LVIDs:         2.30 cm LV PW:         1.20 cm LV IVS:        1.20 cm  LEFT ATRIUM           Index LA diam:      5.00 cm 3.12 cm/m LA Vol (A4C): 51.7 ml 32.21 ml/m  AORTIC VALVE AV Vmax:      255.50 cm/s AV Vmean:     169.000 cm/s AV VTI:       0.420 m AV Peak Grad: 26.1 mmHg AV Mean Grad: 13.5 mmHg MITRAL VALVE MV Area (PHT): 3.89 cm MV Peak grad:  12.5 mmHg MV Mean grad:  3.0 mmHg MV Vmax:       1.77 m/s MV Vmean:      73.7 cm/s MV Decel Time: 195 msec MV E velocity: 136.00 cm/s MV A velocity: 53.90 cm/s MV E/A ratio:  2.52 Shelda Bruckner MD Electronically signed by Shelda Bruckner MD Signature Date/Time: 08/21/2024/1:03:17 PM    Final    US  Venous Img Lower Bilateral (DVT) Result Date: 08/20/2024 CLINICAL DATA:  Lower extremity edema. EXAM: BILATERAL LOWER EXTREMITY VENOUS DOPPLER ULTRASOUND TECHNIQUE:  Gray-scale sonography with graded compression, as well as color Doppler and duplex ultrasound were performed to evaluate the lower extremity deep venous systems from the level of the common femoral vein and including the common femoral, femoral, profunda femoral, popliteal and calf veins including the posterior tibial, peroneal and gastrocnemius veins when visible. The superficial great saphenous vein was also interrogated. Spectral Doppler was utilized to evaluate flow at rest and with distal augmentation maneuvers in the common femoral, femoral and popliteal veins. COMPARISON:  None Available. FINDINGS: RIGHT LOWER EXTREMITY Common Femoral Vein: No evidence of  thrombus. Normal compressibility, respiratory phasicity and response to augmentation. Saphenofemoral Junction: No evidence of thrombus. Normal compressibility and flow on color Doppler imaging. Profunda Femoral Vein: No evidence of thrombus. Normal compressibility and flow on color Doppler imaging. Femoral Vein: Flow present throughout the right femoral vein. There is lack of complete compressibility of the distal right femoral vein with suggestion of some posterior mural thrombus. 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 Popliteal Vein: No evidence of thrombus. Normal compressibility, respiratory phasicity and response to augmentation. Calf Veins: No evidence of thrombus. Normal compressibility and flow on color Doppler imaging. Superficial Great Saphenous Vein: No evidence of thrombus. Normal compressibility. Venous Reflux:  None. Other Findings: No evidence of superficial thrombophlebitis or abnormal fluid collection. LEFT LOWER EXTREMITY Common Femoral Vein: No evidence of thrombus. Normal compressibility, respiratory phasicity and response to augmentation. Saphenofemoral Junction: No evidence of thrombus. Normal compressibility and flow on color Doppler imaging. Profunda Femoral Vein: No evidence of thrombus. Normal compressibility and flow on color Doppler imaging. Femoral Vein: No evidence of thrombus. Normal compressibility, respiratory phasicity and response to augmentation. Popliteal Vein: No evidence of thrombus. Normal compressibility, respiratory phasicity and response to augmentation. Calf Veins: Left calf veins not adequately visualized for evaluation. Superficial Great Saphenous Vein: No evidence of thrombus. Normal compressibility. Venous Reflux:  None. Other Findings: No evidence of superficial thrombophlebitis or abnormal fluid collection. IMPRESSION: 1. No evidence of acute deep venous thrombosis in either lower extremity. 2. Lack of complete compressibility of the  distal right femoral vein with suggestion of some posterior mural thrombus. 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 3. Left calf veins not adequately visualized for evaluation. Electronically Signed   By: Marcey Moan M.D.   On: 08/20/2024 08:21   DG Chest 2 View Result Date: 08/19/2024 EXAM: 2 VIEW(S) XRAY OF THE CHEST 08/19/2024 02:32:00 PM COMPARISON: 03/22/2024 CLINICAL HISTORY: Evaluate for pleural edeam FINDINGS: LINES, TUBES AND DEVICES: Numerous leads and wires over chest. LUNGS AND PLEURA: Bibasilar airspace disease. Small bilateral pleural effusions. Mild interstitial edema suspected. No pneumothorax. HEART AND MEDIASTINUM: Mild cardiomegaly. Atherosclerosis in transverse aorta. BONES AND SOFT TISSUES: No acute osseous abnormality. IMPRESSION: 1. Small bilateral pleural effusions and associated basilar airspace opacities. Favor edema. Atelectasis or infection could appear similarly. Electronically signed by: Norman Gatlin MD 08/19/2024 02:39 PM EDT RP Workstation: [id number]    Microbiology: Results for orders placed or performed during the hospital encounter of 03/19/24  MRSA Next Gen by PCR, Nasal     Status: None   Collection Time: 03/22/24 12:53 PM   Specimen: Nasal Mucosa; Nasal Swab  Result Value Ref Range Status   MRSA by PCR Next Gen NOT DETECTED NOT DETECTED Final    Comment: (NOTE) The GeneXpert MRSA Assay (FDA approved for NASAL specimens only), is one component of a comprehensive MRSA colonization surveillance program. It is not intended to  diagnose MRSA infection nor to guide or monitor treatment for MRSA infections. Test performance is not FDA approved in patients less than 61 years old. Performed at [facility], [address] [place], [place] [address]     Labs: CBC: Recent Labs  Lab 08/19/24 1212  WBC 6.3  NEUTROABS 5.0  HGB 14.3  HCT 45.3  MCV 76.8*  PLT 293   Basic Metabolic Panel: Recent Labs  Lab  08/19/24 1212 08/19/24 2015 08/20/24 0328 08/21/24 0443 08/22/24 0530 08/23/24 0208  NA 134*  --  134* 130* 135 132*  K 2.6* 3.1* 3.3* 2.9* 3.1* 3.8  CL 88*  --  91* 86* 90* 89*  CO2 28  --  26 24 26 25   GLUCOSE 138*  --  111* 121* 119* 128*  BUN 72*  --  74* 76* 74* 74*  CREATININE 2.56*  --  2.47* 2.72* 2.46* 2.47*  CALCIUM  9.4  --  9.1 9.2 9.5 9.3  MG 2.0  --   --  2.1 2.1  --   PHOS  --  2.7  --   --   --   --    Liver Function Tests: Recent Labs  Lab 08/19/24 1212  AST 29  ALT 10  ALKPHOS 137*  BILITOT 2.1*  PROT 7.0  ALBUMIN  3.3*   CBG: Recent Labs  Lab 08/21/24 2127 08/22/24 0828 08/22/24 1218 08/22/24 1650 08/22/24 2040  GLUCAP 162* 119* 190* 135* 129*    Discharge time spent: greater than 30 minutes.  Signed: [name], MD [facility] 08/23/2024

## 2024-08-23 NOTE — Plan of Care (Signed)

## 2024-08-23 NOTE — Progress Notes (Signed)
 PHARMACY CONSULT NOTE - ELECTROLYTES  Pharmacy Consult for Electrolyte Monitoring and Replacement   Recent Labs: Height: 5' (152.4 cm) Weight: 61.2 kg (134 lb 14.7 oz) IBW/kg (Calculated) : 45.5 Estimated Creatinine Clearance: 15.4 mL/min (A) (by C-G formula based on SCr of 2.47 mg/dL (H)). Potassium (mmol/L)  Date Value  08/23/2024 3.8   Magnesium  (mg/dL)  Date Value  88/97/7974 2.1   Calcium  (mg/dL)  Date Value  88/96/7974 9.3   Albumin  (g/dL)  Date Value  89/69/7974 3.3 (L)  12/19/2023 4.1   Phosphorus (mg/dL)  Date Value  89/69/7974 2.7   Sodium (mmol/L)  Date Value  08/23/2024 132 (L)  12/19/2023 133 (L)   Assessment  Karen Farley is a 78 y.o. female presenting with acute HF and low extremity edema. PMH significant for IIDM, HTN, orthostatic hypotension on midodrine , HLD, chronic HFpEF, constrictive pericarditis, chronic A-fib, CKD stage IV . Pharmacy has been consulted to monitor and replace electrolytes.  Diet: heart healthy/carb modified MIVF: IV lock Pertinent medications: furosemide  >> torsemide , spironolactone   Goal of Therapy: Electrolytes WNL, optimizer K and Mg per card indication  Plan:  No replacement currently indicated Check BMP with AM labs  [facility] A Prabhleen Montemayor 08/23/2024 7:24 AM

## 2024-08-23 NOTE — TOC Initial Note (Addendum)
 Transition of Care [facility]) - Initial/Assessment Note    Patient Details  Name: Karen Farley MRN: [id number] Date of Birth: [birth year]  Transition of Care [facility]) CM/SW Contact:    Karen Karen Carpen, LCSW Phone Number: 08/23/2024, 11:43 AM  Clinical Narrative:   CSW met with patient. Husband at bedside. Provided update regarding insurance authorization to return to [facility]. Auth still pending. Patient will need ambulance transport at discharge.               12:52 pm: [name] is approved: [id number]. Valid 11/3-11/5. Left message for the SNF admissions coordinator to notify.  Expected Discharge Plan: Skilled Nursing Facility Barriers to Discharge: Insurance Authorization   Patient Goals and CMS Choice     Choice offered to / list presented to : Patient, Spouse      Expected Discharge Plan and Services     Post Acute Care Choice: Resumption of Svcs/PTA Provider Living arrangements for the past 2 months: Single Family Home, Skilled Nursing Facility Expected Discharge Date: 08/23/24                                    Prior Living Arrangements/Services Living arrangements for the past 2 months: Single Family Home, Skilled Nursing Facility Lives with:: Spouse Patient language and need for interpreter reviewed:: Yes Do you feel safe going back to the place where you live?: Yes      Need for Family Participation in Patient Care: Yes (Comment) Care giver support system in place?: Yes (comment)   Criminal Activity/Legal Involvement Pertinent to Current Situation/Hospitalization: No - Comment as needed  Activities of Daily Living      Permission Sought/Granted Permission sought to share information with : Facility Medical Sales Representative, Family Supports Permission granted to share information with : Yes, Verbal Permission Granted  Share Information with NAME: Karen Farley  Permission granted to share info w AGENCY: [facility]  Permission granted to  share info w Relationship: Husband  Permission granted to share info w Contact Information: [phone]  Emotional Assessment Appearance:: Appears stated age Attitude/Demeanor/Rapport: Engaged, Gracious Affect (typically observed): Accepting, Appropriate, Calm, Pleasant Orientation: : Oriented to Self, Oriented to Place, Oriented to  Time, Oriented to Situation Alcohol / Substance Use: Not Applicable Psych Involvement: No (comment)  Admission diagnosis:  Hypokalemia [E87.6] Acute renal insufficiency [N28.9] Hypervolemia, unspecified hypervolemia type [E87.70] Patient Active Problem List   Diagnosis Date Noted   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9) 03/19/2024   Myocardial injury 03/19/2024   Atrial fibrillation, chronic (HCC) 03/19/2024   Acute idiopathic gout involving toe 09/30/2023   Senile purpura 12/13/2022   Pulmonary hypertension, primary (HCC) 09/07/2021   Pericardial effusion 08/05/2021   Acute blood loss anemia 08/04/2021   Anasarca    Stage 3b chronic kidney disease (HCC) 05/17/2021   Pleural effusion 05/17/2021   Acute on chronic diastolic CHF (congestive  heart failure) (HCC) 05/17/2021   Chronic anticoagulation 05/17/2021   Acute kidney injury superimposed on CKD 04/11/2021   Hyponatremia 04/11/2021   Acute on chronic heart failure with preserved ejection fraction  (HFpEF) (HCC) 04/11/2021   Chronic venous insufficiency 08/14/2020   PAD (peripheral artery disease) 08/14/2020   Bilateral primary osteoarthritis of knee 04/13/2020   Acquired spondylolisthesis 12/17/2019   Lumbar radiculopathy 12/17/2019   Atrial fibrillation (HCC) 11/20/2019   Closed fracture of radial styloid 08/06/2019   Displaced fracture of unspecified radial styloid process, initial encounter for closed fracture 08/06/2019   Controlled substance agreement signed 01/24/2018   Piriformis syndrome 07/01/2016   Sacroiliac joint dysfunction 01/09/2016   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 diabetes mellitus (HCC) 04/28/2015   Lymphedema 03/27/2015   Open-angle glaucoma, mild stage    Osteoarthritis of both knees    Type 2 diabetes mellitus with renal complication (HCC)    Osteopenia    Benign hypertensive renal disease    Hyperlipidemia    Degeneration of lumbosacral intervertebral disc    PCP:  Karen Duwaine SQUIBB, DO Pharmacy:   [facility], [place] - [address] [place] [address] Phone: [phone] Fax: [phone]  [facility] - [place], [place] - [address] [place] [address] Phone: [phone] Fax: [phone]  [id number] - [place], [place] - [address] [place] [address] Phone: [phone] Fax: [phone]     Social Drivers of Health (SDOH) Social History: SDOH Screenings   Food Insecurity: No Food Insecurity (08/19/2024)  Housing: Low Risk  (08/19/2024)  Transportation Needs: No Transportation Needs (08/19/2024)  Utilities: Not At Risk (08/19/2024)  Alcohol Screen: Low Risk  (07/23/2022)  Depression (PHQ2-9): Low Risk  (12/19/2023)  Financial Resource Strain: Low Risk  (07/09/2024)   Received from [facility]  Physical Activity: Sufficiently Active (07/23/2022)  Social Connections: Unknown (08/19/2024)  Recent Concern: Social Connections - Moderately Isolated (06/25/2024)   Stress: No Stress Concern Present (07/23/2022)  Tobacco Use: Low Risk  (08/19/2024)   SDOH Interventions:     Readmission Risk Interventions     No data to display

## 2024-08-23 NOTE — Progress Notes (Signed)
 Tele monitoring order not renewed.  Tele monitor removed from patient.

## 2024-08-23 NOTE — Progress Notes (Signed)
 MD aware of evening BP. MAP 65. No new orders. Evening Midodrine  given.

## 2024-08-23 NOTE — TOC CM/SW Note (Signed)
 Per chart review, patient has been at [facility] 9/10-10/30 for rehab. Admissions coordinator confirmed she can return. CSW started insurance authorization.  Lauraine Carpen, CSW [phone]

## 2024-08-24 DIAGNOSIS — I5022 Chronic systolic (congestive) heart failure: Secondary | ICD-10-CM | POA: Diagnosis not present

## 2024-08-26 DIAGNOSIS — E876 Hypokalemia: Secondary | ICD-10-CM | POA: Diagnosis not present

## 2024-08-26 DIAGNOSIS — L89154 Pressure ulcer of sacral region, stage 4: Secondary | ICD-10-CM | POA: Diagnosis not present

## 2024-08-26 DIAGNOSIS — L24A2 Irritant contact dermatitis due to fecal, urinary or dual incontinence: Secondary | ICD-10-CM | POA: Diagnosis not present

## 2024-08-26 DIAGNOSIS — I959 Hypotension, unspecified: Secondary | ICD-10-CM | POA: Diagnosis not present

## 2024-08-26 DIAGNOSIS — I5022 Chronic systolic (congestive) heart failure: Secondary | ICD-10-CM | POA: Diagnosis not present

## 2024-08-26 DIAGNOSIS — I5023 Acute on chronic systolic (congestive) heart failure: Secondary | ICD-10-CM | POA: Diagnosis not present

## 2024-08-26 DIAGNOSIS — E119 Type 2 diabetes mellitus without complications: Secondary | ICD-10-CM | POA: Diagnosis not present

## 2024-08-29 DIAGNOSIS — E876 Hypokalemia: Secondary | ICD-10-CM | POA: Diagnosis not present

## 2024-08-29 DIAGNOSIS — I5020 Unspecified systolic (congestive) heart failure: Secondary | ICD-10-CM | POA: Diagnosis not present

## 2024-08-30 ENCOUNTER — Ambulatory Visit: Admitting: Family Medicine

## 2024-08-30 DIAGNOSIS — I959 Hypotension, unspecified: Secondary | ICD-10-CM | POA: Diagnosis not present

## 2024-08-30 DIAGNOSIS — R5381 Other malaise: Secondary | ICD-10-CM | POA: Diagnosis not present

## 2024-08-30 DIAGNOSIS — E876 Hypokalemia: Secondary | ICD-10-CM | POA: Diagnosis not present

## 2024-08-31 ENCOUNTER — Observation Stay: Admitting: Family

## 2024-08-31 DIAGNOSIS — E876 Hypokalemia: Secondary | ICD-10-CM | POA: Diagnosis not present

## 2024-08-31 DIAGNOSIS — R0602 Shortness of breath: Secondary | ICD-10-CM | POA: Diagnosis not present

## 2024-08-31 DIAGNOSIS — N184 Chronic kidney disease, stage 4 (severe): Secondary | ICD-10-CM | POA: Diagnosis not present

## 2024-09-01 DIAGNOSIS — L89154 Pressure ulcer of sacral region, stage 4: Secondary | ICD-10-CM | POA: Diagnosis not present

## 2024-09-01 DIAGNOSIS — E119 Type 2 diabetes mellitus without complications: Secondary | ICD-10-CM | POA: Diagnosis not present

## 2024-09-01 DIAGNOSIS — I5022 Chronic systolic (congestive) heart failure: Secondary | ICD-10-CM | POA: Diagnosis not present

## 2024-09-02 DIAGNOSIS — E876 Hypokalemia: Secondary | ICD-10-CM | POA: Diagnosis not present

## 2024-09-02 DIAGNOSIS — R0602 Shortness of breath: Secondary | ICD-10-CM | POA: Diagnosis not present

## 2024-09-02 DIAGNOSIS — L89154 Pressure ulcer of sacral region, stage 4: Secondary | ICD-10-CM | POA: Diagnosis not present

## 2024-09-02 DIAGNOSIS — N184 Chronic kidney disease, stage 4 (severe): Secondary | ICD-10-CM | POA: Diagnosis not present

## 2024-09-03 ENCOUNTER — Telehealth: Payer: Self-pay | Admitting: Family

## 2024-09-03 DIAGNOSIS — M25572 Pain in left ankle and joints of left foot: Secondary | ICD-10-CM | POA: Diagnosis not present

## 2024-09-03 DIAGNOSIS — S8265XD Nondisplaced fracture of lateral malleolus of left fibula, subsequent encounter for closed fracture with routine healing: Secondary | ICD-10-CM | POA: Diagnosis not present

## 2024-09-03 NOTE — Telephone Encounter (Signed)
 Called to confirm/remind patient of their appointment at the [facility] on 09/06/24.   Appointment:   [] Confirmed  [x] Left mess   [] No answer/No voice mail  [] VM Full/unable to leave message  [] Phone not in service  Patient reminded to bring all medications and/or complete list.  Confirmed patient has transportation. Gave directions, instructed to utilize valet parking.

## 2024-09-05 NOTE — Progress Notes (Deleted)
 [facility] Note    PCP: Vicci Duwaine SQUIBB, DO Cardiologist: Evalene Lunger, MD  HF Provider: Odis Brownie, MD  Chief Complaint:   HPI:  Karen Farley is a 78 y.o. 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   Admitted 03/19/2024 with A/C Biventricular HF due to constriction. EF 60-65% with mod-severe AS normal RV and evidence of septal interdependence, bright pericardium.  RHC 03/22/24: RA 18, PA 38/22 (30), PCW 26, CO/CI 3.06/1.85, PAPi 0.89. Placed on pressors and inotropes. She was not a candidate for surgery due to debility and renal dysfunction. Discharged 04/03/24 with Hospice.   Admitted 06/25/24 with a mechanical fall and left ankle fracture. On the day before presentation while exiting her car, she tripped left foot between curbside and her car she fell back to her car and twisted left ankle. X-ray showed acute oblique nondisplaced fracture of the distal fibula. Nonoperable per ortho. Discharged to SNF.  Admitted 08/19/24 after being sent from nursing home for evaluation of severe hypokalemia.  Increased weight gain of about 15 pounds in last few days. She is significant edema in lower extremity more on the right. Chest x-ray-- Showing pulmonary congestion with small bilateral pleural effusions. Severe hypokalemia (2.6) most likely due to diuresis. Due to HF symptoms, IV diuresed. Cardiology consulted. Metolazone  resumed at every other day.  SUBJECTIVE:  She presents today for a HF follow-up visit with a chief complaint of    ROS: All systems negative except what is listed in HPI, PMH and Problem List  Past Medical History:  Diagnosis Date   A-fib (HCC)    CHF (congestive heart failure) (HCC)    Chronic heart failure with preserved ejection fraction (HFpEF) (HCC)    a. 11/2019 Echo: EF 60-65%; b. 07/2021 Echo: EF 60-65%.   CKD stage 3 due to type  1 diabetes mellitus (HCC)    DDD (degenerative disc disease), lumbar    Degenerative disc disease, lumbar    bulging and dengerated   Diabetes mellitus without complication (HCC)    GIB (gastrointestinal bleeding)    a. 07/2021 req 2u prbcs. Eval deferred given resolution of bleeding and cardiac issues.   Hyperlipidemia    Hypertension    Lymphedema    Morbid obesity (HCC)    Osteoarthritis of both knees    Osteopenia    Pericardial effusion    a. 07/2021 Echo: Nl EF. Large pericardial effusion-->s/p pericardiocentesis of ; b. 08/23/2021 Echo: EF 60-65%, mild LVH, Nl RV fxn, sev dil LA, moderate pericardial effusion. L pleural effusion.   Permanent atrial fibrillation (HCC)    Pleural effusion, left    a. 07/2021 s/p thoracentesis - .    Current Outpatient Medications  Medication Sig Dispense Refill   acetaminophen  (TYLENOL ) 325 MG tablet Take 2 tablets (650 mg total) by mouth every 6 (six) hours as needed for mild pain (pain score 1-3) or fever (or Fever >/= 101).     allopurinol  (ZYLOPRIM ) 100 MG tablet Take 1 tablet (100 mg total) by mouth daily. 90 tablet 1   aspirin  EC 81 MG tablet Take 1 tablet (81 mg total) by mouth daily. Swallow whole.     empagliflozin  (JARDIANCE ) 25 MG TABS tablet Take 1 tablet (25 mg total) by mouth daily.     latanoprost  (XALATAN ) 0.005 % ophthalmic solution Place 1 drop into both eyes at bedtime.     metolazone  (  ZAROXOLYN ) 5 MG tablet Take 1 tablet (5 mg total) by mouth every other day. 30 tablet 0   midodrine  (PROAMATINE ) 5 MG tablet Take 1 tablet (5 mg total) by mouth 3 (three) times daily with meals. 90 tablet 0   naloxone (NARCAN) nasal spray 4 mg/0.1 mL Place 0.4 mg into the nose once as needed (SINGNS, SYMPTOMS OF OPIOIDS).     oxyCODONE  (OXY IR/ROXICODONE ) 5 MG immediate release tablet Take 1 tablet (5 mg total) by mouth every 6 (six) hours as needed for severe pain (pain score 7-10) or breakthrough pain. 10 tablet 0   pantoprazole   (PROTONIX ) 40 MG tablet Take 1 tablet (40 mg total) by mouth 2 (two) times daily. 180 tablet 3   polyethylene glycol (MIRALAX  / GLYCOLAX ) 17 g packet Take 17 g by mouth 2 (two) times daily.     potassium chloride  SA (KLOR-CON  M) 20 MEQ tablet Take 40 mEq by mouth daily.     spironolactone  (ALDACTONE ) 25 MG tablet Take 12.5 mg by mouth daily.     torsemide  60 MG TABS 2 tablets by mouth daily 30 tablet 0   No current facility-administered medications for this visit.    No Known Allergies    Social History   Socioeconomic History   Marital status: Married    Spouse name: Not on file   Number of children: Not on file   Years of education: Not on file   Highest education level: Not on file  Occupational History   Not on file  Tobacco Use   Smoking status: Never   Smokeless tobacco: Never  Vaping Use   Vaping status: Never Used  Substance and Sexual Activity   Alcohol use: Not Currently    Alcohol/week: 0.0 standard drinks of alcohol    Comment: on rare occasion   Drug use: No   Sexual activity: Not Currently  Other Topics Concern   Not on file  Social History Narrative   Not on file   Social Drivers of Health   Financial Resource Strain: Low Risk  (07/09/2024)   [facility] from [facility]   Overall Financial Resource Strain (CARDIA)    Difficulty of Paying Living Expenses: Not hard at all  Food Insecurity: No Food Insecurity (08/19/2024)   Hunger Vital Sign    Worried About Running Out of Food in the Last Year: Never true    Ran Out of Food in the Last Year: Never true  Transportation Needs: No Transportation Needs (08/19/2024)   PRAPARE - Administrator, Civil Service (Medical): No    Lack of Transportation (Non-Medical): No  Physical Activity: Sufficiently Active (07/23/2022)   Exercise Vital Sign    Days of Exercise per Week: 3 days    Minutes of Exercise per Session: 60 min  Stress: No Stress Concern Present (07/23/2022)   [employer] - Occupational Stress Questionnaire    Feeling of Stress : Not at all  Social Connections: Unknown (08/19/2024)   Social Connection and Isolation Panel    Frequency of Communication with Friends and Family: Three times a week    Frequency of Social Gatherings with Friends and Family: Twice a week    Attends Religious Services: Not on Marketing Executive or Organizations: No    Attends Banker Meetings: Not on file    Marital Status: Married  Recent Concern: Social Connections - Moderately Isolated (06/25/2024)   Social Connection and  Isolation Panel    Frequency of Communication with Friends and Family: Twice a week    Frequency of Social Gatherings with Friends and Family: Once a week    Attends Religious Services: Never    Database Administrator or Organizations: No    Attends Banker Meetings: Never    Marital Status: Married  Catering Manager Violence: Unknown (08/19/2024)   Humiliation, Afraid, Rape, and Kick questionnaire    Fear of Current or Ex-Partner: No    Emotionally Abused: No    Physically Abused: Not on file    Sexually Abused: No      Family History  Problem Relation Age of Onset   Heart disease Mother        CHF   Osteoporosis Mother    Breast cancer Mother    Heart disease Father 11   Cancer Brother        Colon CA- 2002- Youngest brother   Parkinson's disease Brother        Younger Brother   Heart disease Brother        2 brothers   Diabetes Brother    Cancer Brother    Breast cancer Maternal Grandmother        PHYSICAL EXAM: General:  Well appearing. No respiratory difficulty HEENT: normal Neck: supple. no JVD. Carotids 2+ bilat; no bruits. No lymphadenopathy or thyromegaly appreciated. Cor: PMI nondisplaced. Regular rate & rhythm. No rubs, gallops or murmurs. Lungs: clear Abdomen: soft, nontender, nondistended. No hepatosplenomegaly. No bruits or masses. Good bowel  sounds. Extremities: no cyanosis, clubbing, rash, edema Neuro: alert & oriented x 3, cranial nerves grossly intact. moves all 4 extremities w/o difficulty. Affect pleasant.  ECG:   ASSESSMENT & PLAN:  1: Constrictive pericarditis: Not deemed a surgical candidate given hypotension, CKD IV, debility. Holding steady from a fluid standpoint, but limited by fatigue and weakness. Worsening appetite concerning as well.  - Reasonable for a trial of iHD for volume removal and solute clearance to see if her symptoms improve, especially given extensive diuretic resistance - Unfortunately only a short term solution, as without addressing her initial insult she will continue to retain fluid - Continue current diuretic regimen, torsemide  20mg  daily, metolazone  5mg  every other day, spironolactone  50mg  daily - Poor long term prognosis, as previously stated will be near impossible to control her volume status - weight 143 pounds from last visit here 2 months ago  2: CKD stage IV:  - saw nephrology Jil) 08/25  3: Hypotension: - Continue midodrine  10mg  TID - BMET 08/20/24 reviewed: sodium 134, potassium 3.3, creatinine 2.47, GFR 19   4: Atrial fibrillation: - Permanent, off OAC       Ellouise DELENA Class, FNP-C 09/06/24

## 2024-09-06 ENCOUNTER — Telehealth: Payer: Self-pay | Admitting: Family

## 2024-09-06 ENCOUNTER — Inpatient Hospital Stay: Admitting: Family

## 2024-09-06 DIAGNOSIS — E871 Hypo-osmolality and hyponatremia: Secondary | ICD-10-CM | POA: Diagnosis not present

## 2024-09-06 DIAGNOSIS — E876 Hypokalemia: Secondary | ICD-10-CM | POA: Diagnosis not present

## 2024-09-06 DIAGNOSIS — I5020 Unspecified systolic (congestive) heart failure: Secondary | ICD-10-CM | POA: Diagnosis not present

## 2024-09-06 DIAGNOSIS — N184 Chronic kidney disease, stage 4 (severe): Secondary | ICD-10-CM | POA: Diagnosis not present

## 2024-09-06 NOTE — Telephone Encounter (Signed)
 Patient did not show for her Heart Failure Clinic appointment on 09/06/24.

## 2024-09-09 DIAGNOSIS — L89154 Pressure ulcer of sacral region, stage 4: Secondary | ICD-10-CM | POA: Diagnosis not present

## 2024-09-09 DIAGNOSIS — E876 Hypokalemia: Secondary | ICD-10-CM | POA: Diagnosis not present

## 2024-09-09 DIAGNOSIS — I5020 Unspecified systolic (congestive) heart failure: Secondary | ICD-10-CM | POA: Diagnosis not present

## 2024-09-09 DIAGNOSIS — I5022 Chronic systolic (congestive) heart failure: Secondary | ICD-10-CM | POA: Diagnosis not present

## 2024-09-09 DIAGNOSIS — E119 Type 2 diabetes mellitus without complications: Secondary | ICD-10-CM | POA: Diagnosis not present

## 2024-09-09 DIAGNOSIS — E871 Hypo-osmolality and hyponatremia: Secondary | ICD-10-CM | POA: Diagnosis not present

## 2024-09-13 DIAGNOSIS — E876 Hypokalemia: Secondary | ICD-10-CM | POA: Diagnosis not present

## 2024-09-14 DIAGNOSIS — I890 Lymphedema, not elsewhere classified: Secondary | ICD-10-CM | POA: Diagnosis not present

## 2024-09-14 DIAGNOSIS — E871 Hypo-osmolality and hyponatremia: Secondary | ICD-10-CM | POA: Diagnosis not present

## 2024-09-14 DIAGNOSIS — R600 Localized edema: Secondary | ICD-10-CM | POA: Diagnosis not present

## 2024-09-14 DIAGNOSIS — E1122 Type 2 diabetes mellitus with diabetic chronic kidney disease: Secondary | ICD-10-CM | POA: Diagnosis not present

## 2024-09-14 DIAGNOSIS — Z7984 Long term (current) use of oral hypoglycemic drugs: Secondary | ICD-10-CM | POA: Diagnosis not present

## 2024-09-16 DIAGNOSIS — L89154 Pressure ulcer of sacral region, stage 4: Secondary | ICD-10-CM | POA: Diagnosis not present

## 2024-09-16 DIAGNOSIS — I5022 Chronic systolic (congestive) heart failure: Secondary | ICD-10-CM | POA: Diagnosis not present

## 2024-09-16 DIAGNOSIS — E119 Type 2 diabetes mellitus without complications: Secondary | ICD-10-CM | POA: Diagnosis not present

## 2024-09-17 DIAGNOSIS — N184 Chronic kidney disease, stage 4 (severe): Secondary | ICD-10-CM | POA: Diagnosis not present

## 2024-09-17 DIAGNOSIS — I5020 Unspecified systolic (congestive) heart failure: Secondary | ICD-10-CM | POA: Diagnosis not present

## 2024-09-17 DIAGNOSIS — R627 Adult failure to thrive: Secondary | ICD-10-CM | POA: Diagnosis not present

## 2024-09-23 DIAGNOSIS — I5022 Chronic systolic (congestive) heart failure: Secondary | ICD-10-CM | POA: Diagnosis not present

## 2024-09-23 DIAGNOSIS — E876 Hypokalemia: Secondary | ICD-10-CM | POA: Diagnosis not present

## 2024-09-23 DIAGNOSIS — E871 Hypo-osmolality and hyponatremia: Secondary | ICD-10-CM | POA: Diagnosis not present

## 2024-09-23 DIAGNOSIS — R627 Adult failure to thrive: Secondary | ICD-10-CM | POA: Diagnosis not present

## 2024-09-27 DIAGNOSIS — I5020 Unspecified systolic (congestive) heart failure: Secondary | ICD-10-CM | POA: Diagnosis not present

## 2024-09-28 DIAGNOSIS — R4182 Altered mental status, unspecified: Secondary | ICD-10-CM | POA: Diagnosis not present

## 2024-09-28 DIAGNOSIS — N184 Chronic kidney disease, stage 4 (severe): Secondary | ICD-10-CM | POA: Diagnosis not present

## 2024-09-29 DIAGNOSIS — I5022 Chronic systolic (congestive) heart failure: Secondary | ICD-10-CM | POA: Diagnosis not present

## 2024-09-29 DIAGNOSIS — E876 Hypokalemia: Secondary | ICD-10-CM | POA: Diagnosis not present

## 2024-09-29 DIAGNOSIS — R627 Adult failure to thrive: Secondary | ICD-10-CM | POA: Diagnosis not present

## 2024-09-29 DIAGNOSIS — E119 Type 2 diabetes mellitus without complications: Secondary | ICD-10-CM | POA: Diagnosis not present

## 2024-10-04 ENCOUNTER — Telehealth: Payer: Self-pay

## 2024-10-04 NOTE — Transitions of Care (Post Inpatient/ED Visit) (Unsigned)
° °  10/04/2024  Name: Karen Farley MRN: [id number] DOB: 07/30/1946  Today's TOC FU Call Status: Today's TOC FU Call Status:: Unsuccessful Call (1st Attempt) Unsuccessful Call (1st Attempt) Date: 10/04/24  Attempted to reach the patient regarding the most recent Inpatient/ED visit.  Follow Up Plan: Additional outreach attempts will be made to reach the patient to complete the Transitions of Care (Post Inpatient/ED visit) call.   Signature Julian Lemmings, LPN [facility] Nurse Health Advisor Direct Dial [phone]

## 2024-10-06 NOTE — Transitions of Care (Post Inpatient/ED Visit) (Unsigned)
° °  10/06/2024  Name: Karen Farley MRN: [id number] DOB: [birth year]  Today's TOC FU Call Status: Today's TOC FU Call Status:: Unsuccessful Call (2nd Attempt) Unsuccessful Call (1st Attempt) Date: 10/04/24 Unsuccessful Call (2nd Attempt) Date: 10/06/24  Attempted to reach the patient regarding the most recent Inpatient/ED visit.  Follow Up Plan: Additional outreach attempts will be made to reach the patient to complete the Transitions of Care (Post Inpatient/ED visit) call.   Signature Julian Lemmings, LPN [facility] Nurse Health Advisor Direct Dial [phone]

## 2024-10-07 NOTE — Transitions of Care (Post Inpatient/ED Visit) (Signed)
° °  10/07/2024  Name: Karen Farley MRN: [id number] DOB: 04/22/1946  Today's TOC FU Call Status: Today's TOC FU Call Status:: Unsuccessful Call (3rd Attempt) Unsuccessful Call (1st Attempt) Date: 10/04/24 Unsuccessful Call (2nd Attempt) Date: 10/06/24 Unsuccessful Call (3rd Attempt) Date: 10/07/24  Attempted to reach the patient regarding the most recent Inpatient/ED visit.  Follow Up Plan: No further outreach attempts will be made at this time. We have been unable to contact the patient.  Signature Julian Lemmings, LPN [facility] Nurse Health Advisor Direct Dial [phone]

## 2024-10-19 ENCOUNTER — Telehealth: Payer: Self-pay

## 2024-10-19 ENCOUNTER — Inpatient Hospital Stay: Admitting: Nurse Practitioner

## 2024-10-19 NOTE — Telephone Encounter (Signed)
 FYI to provider

## 2024-10-19 NOTE — Telephone Encounter (Signed)
 Copied from CRM [id number]. Topic: General - Other >> Oct 18, 2024  5:01 PM Shanda MATSU wrote: Reason for CRM: [place] Occupational therapist w/[facility] called in to adv that she went to see patient today but patient's husband adv her that patient was not feeling great and req to reschedule appt for next week.

## 2024-10-19 NOTE — Progress Notes (Deleted)
 "  There were no vitals taken for this visit.   Subjective:    Patient ID: Karen Farley, female    DOB: 1945/12/09, 78 y.o.   MRN: [id number]  HPI: Karen Farley is a 78 y.o. female  No chief complaint on file.   Relevant past medical, surgical, family and social history reviewed and updated as indicated. Interim medical history since our last visit reviewed. Allergies and medications reviewed and updated.  Review of Systems  Per HPI unless specifically indicated above     Objective:    There were no vitals taken for this visit.  Wt Readings from Last 3 Encounters:  08/23/24 134 lb 14.7 oz (61.2 kg)  06/25/24 151 lb (68.5 kg)  06/23/24 143 lb (64.9 kg)    Physical Exam  Results for orders placed or performed during the hospital encounter of 08/19/24  CBC with Differential   Collection Time: 08/19/24 12:12 PM  Result Value Ref Range   WBC 6.3 4.0 - 10.5 K/uL   RBC 5.90 (H) 3.87 - 5.11 MIL/uL   Hemoglobin 14.3 12.0 - 15.0 g/dL   HCT 54.6 63.9 - 53.9 %   MCV 76.8 (L) 80.0 - 100.0 fL   MCH 24.2 (L) 26.0 - 34.0 pg   MCHC 31.6 30.0 - 36.0 g/dL   RDW 77.0 (H) 88.4 - 84.4 %   Platelets 293 150 - 400 K/uL   nRBC 0.0 0.0 - 0.2 %   Neutrophils Relative % 79 %   Neutro Abs 5.0 1.7 - 7.7 K/uL   Lymphocytes Relative 6 %   Lymphs Abs 0.4 (L) 0.7 - 4.0 K/uL   Monocytes Relative 12 %   Monocytes Absolute 0.8 0.1 - 1.0 K/uL   Eosinophils Relative 1 %   Eosinophils Absolute 0.1 0.0 - 0.5 K/uL   Basophils Relative 1 %   Basophils Absolute 0.1 0.0 - 0.1 K/uL   WBC Morphology MORPHOLOGY UNREMARKABLE    RBC Morphology See Note    Smear Review Normal platelet morphology    Immature Granulocytes 1 %   Abs Immature Granulocytes 0.03 0.00 - 0.07 K/uL  Comprehensive metabolic panel   Collection Time: 08/19/24 12:12 PM  Result Value Ref Range   Sodium 134 (L) 135 - 145 mmol/L   Potassium 2.6 (LL) 3.5 - 5.1 mmol/L   Chloride 88 (L) 98 - 111 mmol/L   CO2 28 22 - 32 mmol/L    Glucose, Bld 138 (H) 70 - 99 mg/dL   BUN 72 (H) 8 - 23 mg/dL   Creatinine, Ser 7.43 (H) 0.44 - 1.00 mg/dL   Calcium  9.4 8.9 - 10.3 mg/dL   Total Protein 7.0 6.5 - 8.1 g/dL   Albumin  3.3 (L) 3.5 - 5.0 g/dL   AST 29 15 - 41 U/L   ALT 10 0 - 44 U/L   Alkaline Phosphatase 137 (H) 38 - 126 U/L   Total Bilirubin 2.1 (H) 0.0 - 1.2 mg/dL   GFR, Estimated 19 (L) >60 mL/min   Anion gap 18 (H) 5 - 15  Brain natriuretic peptide   Collection Time: 08/19/24 12:12 PM  Result Value Ref Range   B Natriuretic Peptide 242.5 (H) 0.0 - 100.0 pg/mL  Magnesium    Collection Time: 08/19/24 12:12 PM  Result Value Ref Range   Magnesium  2.0 1.7 - 2.4 mg/dL  Potassium   Collection Time: 08/19/24  8:15 PM  Result Value Ref Range   Potassium 3.1 (L) 3.5 - 5.1 mmol/L  Phosphorus  Collection Time: 08/19/24  8:15 PM  Result Value Ref Range   Phosphorus 2.7 2.5 - 4.6 mg/dL  Glucose, capillary   Collection Time: 08/19/24 10:38 PM  Result Value Ref Range   Glucose-Capillary 136 (H) 70 - 99 mg/dL  Basic metabolic panel   Collection Time: 08/20/24  3:28 AM  Result Value Ref Range   Sodium 134 (L) 135 - 145 mmol/L   Potassium 3.3 (L) 3.5 - 5.1 mmol/L   Chloride 91 (L) 98 - 111 mmol/L   CO2 26 22 - 32 mmol/L   Glucose, Bld 111 (H) 70 - 99 mg/dL   BUN 74 (H) 8 - 23 mg/dL   Creatinine, Ser 7.52 (H) 0.44 - 1.00 mg/dL   Calcium  9.1 8.9 - 10.3 mg/dL   GFR, Estimated 19 (L) >60 mL/min   Anion gap 17 (H) 5 - 15  Glucose, capillary   Collection Time: 08/20/24  7:44 AM  Result Value Ref Range   Glucose-Capillary 122 (H) 70 - 99 mg/dL  Glucose, capillary   Collection Time: 08/20/24 11:38 AM  Result Value Ref Range   Glucose-Capillary 124 (H) 70 - 99 mg/dL  Glucose, capillary   Collection Time: 08/20/24  4:31 PM  Result Value Ref Range   Glucose-Capillary 153 (H) 70 - 99 mg/dL  Glucose, capillary   Collection Time: 08/20/24  9:43 PM  Result Value Ref Range   Glucose-Capillary 137 (H) 70 - 99 mg/dL   Basic metabolic panel   Collection Time: 08/21/24  4:43 AM  Result Value Ref Range   Sodium 130 (L) 135 - 145 mmol/L   Potassium 2.9 (L) 3.5 - 5.1 mmol/L   Chloride 86 (L) 98 - 111 mmol/L   CO2 24 22 - 32 mmol/L   Glucose, Bld 121 (H) 70 - 99 mg/dL   BUN 76 (H) 8 - 23 mg/dL   Creatinine, Ser 7.27 (H) 0.44 - 1.00 mg/dL   Calcium  9.2 8.9 - 10.3 mg/dL   GFR, Estimated 17 (L) >60 mL/min   Anion gap 20 (H) 5 - 15  Magnesium    Collection Time: 08/21/24  4:43 AM  Result Value Ref Range   Magnesium  2.1 1.7 - 2.4 mg/dL  Glucose, capillary   Collection Time: 08/21/24  7:48 AM  Result Value Ref Range   Glucose-Capillary 108 (H) 70 - 99 mg/dL  ECHOCARDIOGRAM LIMITED   Collection Time: 08/21/24 10:06 AM  Result Value Ref Range   Weight 2,243.4 oz   Height 60 in   BP 94/74 mmHg   Ao pk vel 2.56 m/s   AV Mean grad 13.5 mmHg   AV Peak grad 26.1 mmHg   S' Lateral 2.30 cm   Area-P 1/2 3.89 cm2   Est EF 60 - 65%   Glucose, capillary   Collection Time: 08/21/24  1:05 PM  Result Value Ref Range   Glucose-Capillary 156 (H) 70 - 99 mg/dL  Glucose, capillary   Collection Time: 08/21/24  4:50 PM  Result Value Ref Range   Glucose-Capillary 185 (H) 70 - 99 mg/dL  Glucose, capillary   Collection Time: 08/21/24  9:27 PM  Result Value Ref Range   Glucose-Capillary 162 (H) 70 - 99 mg/dL  Basic metabolic panel with GFR   Collection Time: 08/22/24  5:30 AM  Result Value Ref Range   Sodium 135 135 - 145 mmol/L   Potassium 3.1 (L) 3.5 - 5.1 mmol/L   Chloride 90 (L) 98 - 111 mmol/L   CO2 26 22 -  32 mmol/L   Glucose, Bld 119 (H) 70 - 99 mg/dL   BUN 74 (H) 8 - 23 mg/dL   Creatinine, Ser 7.53 (H) 0.44 - 1.00 mg/dL   Calcium  9.5 8.9 - 10.3 mg/dL   GFR, Estimated 20 (L) >60 mL/min   Anion gap 19 (H) 5 - 15  Magnesium    Collection Time: 08/22/24  5:30 AM  Result Value Ref Range   Magnesium  2.1 1.7 - 2.4 mg/dL  Glucose, capillary   Collection Time: 08/22/24  8:28 AM  Result Value Ref Range    Glucose-Capillary 119 (H) 70 - 99 mg/dL  Glucose, capillary   Collection Time: 08/22/24 12:18 PM  Result Value Ref Range   Glucose-Capillary 190 (H) 70 - 99 mg/dL  Glucose, capillary   Collection Time: 08/22/24  4:50 PM  Result Value Ref Range   Glucose-Capillary 135 (H) 70 - 99 mg/dL  Glucose, capillary   Collection Time: 08/22/24  8:40 PM  Result Value Ref Range   Glucose-Capillary 129 (H) 70 - 99 mg/dL  Basic metabolic panel with GFR   Collection Time: 08/23/24  2:08 AM  Result Value Ref Range   Sodium 132 (L) 135 - 145 mmol/L   Potassium 3.8 3.5 - 5.1 mmol/L   Chloride 89 (L) 98 - 111 mmol/L   CO2 25 22 - 32 mmol/L   Glucose, Bld 128 (H) 70 - 99 mg/dL   BUN 74 (H) 8 - 23 mg/dL   Creatinine, Ser 7.52 (H) 0.44 - 1.00 mg/dL   Calcium  9.3 8.9 - 10.3 mg/dL   GFR, Estimated 19 (L) >60 mL/min   Anion gap 18 (H) 5 - 15  Glucose, capillary   Collection Time: 08/23/24  8:44 AM  Result Value Ref Range   Glucose-Capillary 113 (H) 70 - 99 mg/dL  Glucose, capillary   Collection Time: 08/23/24 11:27 AM  Result Value Ref Range   Glucose-Capillary 121 (H) 70 - 99 mg/dL  Glucose, capillary   Collection Time: 08/23/24  4:34 PM  Result Value Ref Range   Glucose-Capillary 159 (H) 70 - 99 mg/dL  Glucose, capillary   Collection Time: 08/23/24 11:00 PM  Result Value Ref Range   Glucose-Capillary 124 (H) 70 - 99 mg/dL      Assessment & Plan:   Problem List Items Addressed This Visit   None    Follow up plan: No follow-ups on file.      "

## 2024-10-27 ENCOUNTER — Other Ambulatory Visit: Payer: Self-pay | Admitting: Family Medicine

## 2024-10-28 NOTE — Telephone Encounter (Signed)
 Called and LVM asking for patient to please return my call. Need to find out if the patient is still taking this medications and if she is in need of a refill. Patient is also overdue for a follow up visit.   OK for E2C2 to speak to patient and find out the above information and schedule follow up appointment if they patient calls back.

## 2024-11-01 ENCOUNTER — Other Ambulatory Visit: Payer: Self-pay

## 2024-11-01 ENCOUNTER — Emergency Department

## 2024-11-01 ENCOUNTER — Encounter: Payer: Self-pay | Admitting: Emergency Medicine

## 2024-11-01 ENCOUNTER — Emergency Department
Admission: EM | Admit: 2024-11-01 | Discharge: 2024-11-02 | Disposition: A | Attending: Emergency Medicine | Admitting: Emergency Medicine

## 2024-11-01 ENCOUNTER — Ambulatory Visit: Payer: Self-pay

## 2024-11-01 DIAGNOSIS — I509 Heart failure, unspecified: Secondary | ICD-10-CM | POA: Insufficient documentation

## 2024-11-01 DIAGNOSIS — E1122 Type 2 diabetes mellitus with diabetic chronic kidney disease: Secondary | ICD-10-CM | POA: Insufficient documentation

## 2024-11-01 DIAGNOSIS — N189 Chronic kidney disease, unspecified: Secondary | ICD-10-CM | POA: Insufficient documentation

## 2024-11-01 DIAGNOSIS — I130 Hypertensive heart and chronic kidney disease with heart failure and stage 1 through stage 4 chronic kidney disease, or unspecified chronic kidney disease: Secondary | ICD-10-CM | POA: Insufficient documentation

## 2024-11-01 DIAGNOSIS — L03114 Cellulitis of left upper limb: Secondary | ICD-10-CM | POA: Diagnosis present

## 2024-11-01 LAB — BASIC METABOLIC PANEL WITH GFR
Anion gap: 12 (ref 5–15)
BUN: 71 mg/dL — ABNORMAL HIGH (ref 8–23)
CO2: 25 mmol/L (ref 22–32)
Calcium: 10.4 mg/dL — ABNORMAL HIGH (ref 8.9–10.3)
Chloride: 92 mmol/L — ABNORMAL LOW (ref 98–111)
Creatinine, Ser: 2.1 mg/dL — ABNORMAL HIGH (ref 0.44–1.00)
GFR, Estimated: 24 mL/min — ABNORMAL LOW
Glucose, Bld: 159 mg/dL — ABNORMAL HIGH (ref 70–99)
Potassium: 4.9 mmol/L (ref 3.5–5.1)
Sodium: 129 mmol/L — ABNORMAL LOW (ref 135–145)

## 2024-11-01 LAB — CBC WITH DIFFERENTIAL/PLATELET
Abs Immature Granulocytes: 0.06 K/uL (ref 0.00–0.07)
Basophils Absolute: 0.1 K/uL (ref 0.0–0.1)
Basophils Relative: 1 %
Eosinophils Absolute: 0.1 K/uL (ref 0.0–0.5)
Eosinophils Relative: 1 %
HCT: 50.2 % — ABNORMAL HIGH (ref 36.0–46.0)
Hemoglobin: 15.8 g/dL — ABNORMAL HIGH (ref 12.0–15.0)
Immature Granulocytes: 1 %
Lymphocytes Relative: 9 %
Lymphs Abs: 0.5 K/uL — ABNORMAL LOW (ref 0.7–4.0)
MCH: 24.7 pg — ABNORMAL LOW (ref 26.0–34.0)
MCHC: 31.5 g/dL (ref 30.0–36.0)
MCV: 78.4 fL — ABNORMAL LOW (ref 80.0–100.0)
Monocytes Absolute: 0.7 K/uL (ref 0.1–1.0)
Monocytes Relative: 11 %
Neutro Abs: 4.9 K/uL (ref 1.7–7.7)
Neutrophils Relative %: 77 %
Platelets: 241 K/uL (ref 150–400)
RBC: 6.4 MIL/uL — ABNORMAL HIGH (ref 3.87–5.11)
RDW: 23.3 % — ABNORMAL HIGH (ref 11.5–15.5)
Smear Review: NORMAL
WBC: 6.3 K/uL (ref 4.0–10.5)
nRBC: 0 % (ref 0.0–0.2)

## 2024-11-01 MED ORDER — DOXYCYCLINE HYCLATE 100 MG PO TABS: 100.0000 mg | ORAL_TABLET | Freq: Two times a day (BID) | ORAL | 0 refills | Status: AC

## 2024-11-01 MED ORDER — MIDODRINE HCL 5 MG PO TABS
5.0000 mg | ORAL_TABLET | Freq: Once | ORAL | Status: AC
  Administered 2024-11-01: 5 mg via ORAL
  Filled 2024-11-01: qty 1

## 2024-11-01 MED ORDER — DOXYCYCLINE HYCLATE 100 MG PO TABS
100.0000 mg | ORAL_TABLET | Freq: Once | ORAL | Status: AC
  Administered 2024-11-01: 100 mg via ORAL
  Filled 2024-11-01: qty 1

## 2024-11-01 MED ORDER — CEPHALEXIN 500 MG PO CAPS: 500.0000 mg | ORAL_CAPSULE | Freq: Four times a day (QID) | ORAL | 0 refills | Status: AC

## 2024-11-01 MED ORDER — CEPHALEXIN 500 MG PO CAPS
500.0000 mg | ORAL_CAPSULE | Freq: Once | ORAL | Status: AC
  Administered 2024-11-01: 500 mg via ORAL
  Filled 2024-11-01: qty 1

## 2024-11-01 NOTE — ED Provider Notes (Signed)
 Karen Farley Provider Note    Event Date/Time   First MD Initiated Contact with Patient 11/01/24 1524     (approximate)   History   No chief complaint on file.   HPI  Karen Farley is a 79 y.o. female with history of A-fib, CHF, CKD, diabetes, hyperlipidemia, hypertension, cirrhosis, chronic low blood pressures on midodrine , presenting with redness to her left arm.  Husband says that he has noticed it last several days.  States that there was some clear bubbles and weeping.  States that she does sleep on the left arm.  No fever.  No shortness of breath or chest pain, she has no other complaints or symptoms.  Per independent history from husband, home health come by today, states that they noted that her O2 sats were in the 80s, unclear if the pleth was good.  Was sent in for further evaluation.  But she has no complaints and he has not heard any reports of shortness of breath or cough.  States that sometimes in clinic they are unable to get a good waveform on her but after troubleshooting they typically are able to get a waveform and her O2 sats are normal.  States that her blood pressures tend to typically be low in the 90s while on midodrine .  Did not take her afternoon midodrine  today.  No fever.  Per independent history from EMS, home health agency had called due to low O2 levels.  Patient had denied any shortness of breath for them and EMS reported that her O2 levels were then more 100% on room air and route.     Physical Exam   Triage Vital Signs: ED Triage Vitals  Encounter Vitals Group     BP 11/01/24 1355 (!) 87/66     Girls Systolic BP Percentile --      Girls Diastolic BP Percentile --      Boys Systolic BP Percentile --      Boys Diastolic BP Percentile --      Pulse Rate 11/01/24 1355 87     Resp 11/01/24 1355 18     Temp 11/01/24 1355 97.6 F (36.4 C)     Temp Source 11/01/24 1355 Oral     SpO2 11/01/24 1354 100 %     Weight 11/01/24  1356 134 lb 7.7 oz (61 kg)     Height 11/01/24 1356 5' (1.524 m)     Head Circumference --      Peak Flow --      Pain Score 11/01/24 1356 0     Pain Loc --      Pain Education --      Exclude from Growth Chart --     Most recent vital signs: Vitals:   11/01/24 1629 11/01/24 1805  BP: 111/76   Pulse: 84   Resp: 18   Temp:  97.7 F (36.5 C)  SpO2: 100%      General: Awake, no distress.  CV:  Good peripheral perfusion.  Resp:  Normal effort.  No tachypnea or respiratory distress Abd:  No distention.  Soft nontender Other:  Bilateral lower extremity edema that appears symmetrical, she has some mild edema to her bilateral upper extremity, they do appear symmetrical.  No blisters noted on her left upper extremity.  It does appear that her left forearm has some blisters that have healed.  There is no erythema to the left humerus and left forearm without bony tenderness.  Equal  radial pulses bilaterally.  Sensation is intact and so is her grip strength.  No purulent drainage.  No palpable fluctuance.   ED Results / Procedures / Treatments   Labs (all labs ordered are listed, but only abnormal results are displayed) Labs Reviewed  CBC WITH DIFFERENTIAL/PLATELET - Abnormal; Notable for the following components:      Result Value   RBC 6.40 (*)    Hemoglobin 15.8 (*)    HCT 50.2 (*)    MCV 78.4 (*)    MCH 24.7 (*)    RDW 23.3 (*)    Lymphs Abs 0.5 (*)    All other components within normal limits  BASIC METABOLIC PANEL WITH GFR - Abnormal; Notable for the following components:   Sodium 129 (*)    Chloride 92 (*)    Glucose, Bld 159 (*)    BUN 71 (*)    Creatinine, Ser 2.10 (*)    Calcium  10.4 (*)    GFR, Estimated 24 (*)    All other components within normal limits     RADIOLOGY On my independent interpretation, ultrasound without obvious DVT   PROCEDURES:  Critical Care performed: No  Procedures   MEDICATIONS ORDERED IN ED: Medications  midodrine   (PROAMATINE ) tablet 5 mg (5 mg Oral Given 11/01/24 1559)  doxycycline  (VIBRA -TABS) tablet 100 mg (100 mg Oral Given 11/01/24 1607)  cephALEXin  (KEFLEX ) capsule 500 mg (500 mg Oral Given 11/01/24 1607)     IMPRESSION / MDM / ASSESSMENT AND PLAN / ED COURSE  I reviewed the triage vital signs and the nursing notes.                              Differential diagnosis includes, but is not limited to, possible dependent edema, cellulitis, DVT.  For the hypoxia, did consider perhaps that he did not get a good waveform.  Patient denies any shortness of breath.  She was not hypoxic for EMS that she is 100% here in the ED.  For the soft blood pressures, has been states that her blood pressures tend to be soft even while on the midodrine  and she did miss a dose today.  She is not lightheaded, no fever, will give her missed dose of midodrine  and recheck her blood pressures.  Labs were obtained out of triage, will give her a dose of antibiotics here for possible cellulitis as well as get a DVT ultrasound of her left upper extremity.  Patient's presentation is most consistent with acute presentation with potential threat to life or bodily function.  Independent interpretation of labs and imaging below.  Ultrasound without obvious DVT.  On recheck patient still satting 100% on room air, no shortness of breath or tachypnea.  Blood pressures uptrending with the midodrine .  Considered but no indication for inpatient admission at this time, she safer outpatient management.  Will discharge with strict return precautions.  Will send antibiotics to her pharmacy and have her follow-up with primary care in 2 to 3 days to get reassessed.  Did discuss with her about not laying on that arm until her cellulitis is completely resolved.  Shared decision making done with patient and husband they are agreeable with this plan.   Clinical Course as of 11/01/24 2344  Mon Nov 01, 2024  8440 Independent review of labs, no leukocytosis,  mild hyponatremia, creatinine is elevated but downtrending compared to prior, rest of electrolytes not severely deranged [TT]  1727 US   Venous Img Upper Left (DVT Study) IMPRESSION: No evidence of DVT or superficial thrombophlebitis within the LEFT upper extremity.   [TT]    Clinical Course User Index [TT] Waymond, Lorelle Cummins, MD     FINAL CLINICAL IMPRESSION(S) / ED DIAGNOSES   Final diagnoses:  Cellulitis of left upper extremity     Rx / DC Orders   ED Discharge Orders          Ordered    doxycycline  (VIBRA -TABS) 100 MG tablet  2 times daily        11/01/24 1730    cephALEXin  (KEFLEX ) 500 MG capsule  4 times daily        11/01/24 1730             Note:  This document was prepared using Dragon voice recognition software and may include unintentional dictation errors.    Waymond Lorelle Cummins, MD 11/01/24 [phone]

## 2024-11-01 NOTE — Telephone Encounter (Signed)
 Agree with ER- please get her scheduled with me after for ER/hospital follow up

## 2024-11-01 NOTE — ED Notes (Signed)
 [facility] CALLED FOR  TRANSPORT HOME

## 2024-11-01 NOTE — Telephone Encounter (Signed)
" °  FYI Only or Action Required?: FYI only for provider: ED advised.  Patient was last seen in primary care on 12/19/2023 by Vicci Duwaine SQUIBB, DO.  Called Nurse Triage reporting Breathing Problem.  Symptoms began today.  Interventions attempted: Nothing.  Symptoms are: gradually worsening.  Triage Disposition: Go to ED Now (Notify PCP)  Patient/caregiver understands and will follow disposition?: Yes  Copied from CRM #[id number]. Topic: Clinical - Red Word Triage >> Nov 01, 2024 12:46 PM Montie POUR wrote: Red Word that prompted transfer to Nurse Triage:  Her left forearm has redness with blisters. This just started. Her oxygen is at 84; heart rate is at 114 Reason for Disposition  Oxygen level (e.g., pulse oximetry) 90% or lower  Answer Assessment - Initial Assessment Questions Nurse states pt bed bound with orders that o2 sat not to go below 88. Advised to call 911  1. MAIN CONCERN OR SYMPTOM: What's your main concern? (e.g., low oxygen level, breathing difficulty) What question do you have?     Low o2 sat 2. ONSET: When did the  low o2 sat  start?      Home health nurse now there 3. OXYGEN THERAPY:      no 4.  OXYGEN EQUIPMENT:  Are you having any trouble with your oxygen equipment?  (e.g., cannula, mask, tubing, tank, concentrator)      5. OXYGEN SATURATION MONITOR:        6. OXYGEN LEVEL: What is your reading (oxygen level) today? What is your usual oxygen saturation reading? (e.g., 95%)     84% 7. VSS MONITORING: Do you monitor/measure your oxygen level or vital signs? (e.g., yes, no, measurements are automatically sent to provider/call center). Document CURRENT and NORMAL BASELINE values if available.       Bp 90/60 8. BREATHING DIFFICULTY: Are you having any difficulty breathing? If Yes, ask: How bad is it?  (e.g., none, mild, moderate, severe)      denies 9. OTHER SYMPTOMS: Do you have any other symptoms? (e.g., fever, change in sputum)     denies 10.  SMOKING: Do you smoke currently? Is there anyone that smokes around you?  (Note: smoking around oxygen is dangerous!)  Protocols used: Oxygen Monitoring and Hypoxia-A-AH  "

## 2024-11-01 NOTE — Telephone Encounter (Signed)
 Called patient and left a message to call back to get scheduled.

## 2024-11-01 NOTE — Discharge Instructions (Signed)
 Please take the antibiotics as prescribed for your cellulitis.  Please make sure that you are not sleeping on that arm.  Please make sure to see your primary care doctor in 2 to 3 days to get your arm reassessed.

## 2024-11-01 NOTE — ED Notes (Signed)
 Called [facility] for update; pt is next on list to transport home

## 2024-11-01 NOTE — Telephone Encounter (Signed)
 FYI to provider

## 2024-11-01 NOTE — ED Triage Notes (Signed)
 Pt arrives via EMS from home where home health agency called due to low O2. Pt denies SOB and EMS reports O2 stayed 100% RA en route. Husband at bedside reports they were concerned about possible dehydration and some sores on her arm. Pt bedbound. Husband reports pt BP is normally low.

## 2024-11-02 NOTE — ED Notes (Signed)
 Called husband at home with discharge instructions. [facility] ambulance service at bedside to begin patient transport.

## 2024-11-10 ENCOUNTER — Telehealth: Payer: Self-pay | Admitting: Family

## 2024-11-10 NOTE — Progress Notes (Unsigned)
" ° °  ADVANCED HEART FAILURE FOLLOW UP CLINIC NOTE  Referring Physician: Vicci Duwaine SQUIBB, DO  Primary Care: Vicci Duwaine SQUIBB, DO Primary Cardiologist:  HPI: Karen Farley is a 79 y.o. 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      Admitted 03/19/2024 with A/C Biventricular HF due to constriction. EF 60-65% with mod-severe AS normal RV and evidence of septal interdependence, bright pericardium.  RHC 03/22/24: RA 18, PA 38/22 (30), PCW 26, CO/CI 3.06/1.85, PAPi 0.89. Placed on pressors and inotropes. She was not a candidate for surgery due to debility and renal dysfunction. Discharged 04/03/24 with Hospice.      SUBJECTIVE:  Patient reports that she recently left hospice due to inability to see her physicians.  She has remained exceedingly weak since discharge, though was able to get up and go to the bathroom at home.  Her appetite has worsened recently and she reports that her lower extremities feel a swollen.  She denies significant shortness of breath and has actually been losing weight recently.  She has been following with nephrology who is considering a short trial run of dialysis for her.  PMH, current medications, allergies, social history, and family history reviewed in epic.  PHYSICAL EXAM: There were no vitals filed for this visit.  GENERAL: Chronically ill-appearing PULM:  Normal work of breathing, clear to auscultation bilaterally. Respirations are unlabored.  CARDIAC:  JVP: Moderately elevated        Systolic murmur, 2+ lower extremity edema.  Irregular rate and rhythm ABDOMEN: Soft, non-tender, non-distended. NEUROLOGIC: Patient is oriented x3 with no focal or lateralizing neurologic deficits.   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  Holding steady from a fluid standpoint, but limited by fatigue and weakness. Worsening appetite concerning as well.  - Reasonable for a trial of iHD for volume removal and solute clearance to see if her symptoms improve, especially given extensive diuretic resistance - Unfortunately only a short term solution, as without addressing her initial insult she will continue to retain fluid - Continue current diuretic regimen, torsemide  20mg  daily, metolazone  5mg  every other day, spironolactone  50mg  daily - Poor long term prognosis, as previously stated will be near impossible to control her volume status  CKD stage IV: Discussion about iHD with primary nephrologist. - Reasonable to try, but will ultimately be unable to control volume status and hypotension may make it difficult - Would continue diuretics on non-iHD days  Hypotension: - Continue midodrine  10mg  TID  Atrial fibrillation: - Permanent, off OAC  Follow up in 3 months  Morene Brownie, MD Advanced Heart Failure Mechanical Circulatory Support 11/10/24 "

## 2024-11-10 NOTE — Telephone Encounter (Signed)
 Called to confirm/remind patient of their appointment at the [facility] on 11/11/24.   Appointment:   [] Confirmed  [x] Left mess   [] No answer/No voice mail  [] VM Full/unable to leave message  [] Phone not in service  Patient reminded to bring all medications and/or complete list.  Confirmed patient has transportation. Gave directions, instructed to utilize valet parking.

## 2024-11-11 ENCOUNTER — Telehealth: Payer: Self-pay | Admitting: Family

## 2024-11-11 ENCOUNTER — Ambulatory Visit: Admitting: Family

## 2024-11-11 NOTE — Telephone Encounter (Signed)
 Patient did not show for her [facility] Failure Clinic appointment on 11/11/24.
